# Patient Record
Sex: Male | Born: 1950 | Race: White | Hispanic: No | Smoking: Former smoker
Health system: Southern US, Community
[De-identification: ages and names within clinical notes are randomized; demographics above are authoritative.]

## PROBLEM LIST (undated history)

## (undated) DIAGNOSIS — R0602 Shortness of breath: Secondary | ICD-10-CM

## (undated) DIAGNOSIS — I4819 Other persistent atrial fibrillation: Secondary | ICD-10-CM

## (undated) DIAGNOSIS — I10 Essential (primary) hypertension: Secondary | ICD-10-CM

## (undated) DIAGNOSIS — E669 Obesity, unspecified: Secondary | ICD-10-CM

## (undated) DIAGNOSIS — E785 Hyperlipidemia, unspecified: Secondary | ICD-10-CM

## (undated) DIAGNOSIS — E119 Type 2 diabetes mellitus without complications: Secondary | ICD-10-CM

## (undated) DIAGNOSIS — Z7901 Long term (current) use of anticoagulants: Secondary | ICD-10-CM

## (undated) DIAGNOSIS — I2510 Atherosclerotic heart disease of native coronary artery without angina pectoris: Secondary | ICD-10-CM

## (undated) DIAGNOSIS — I4892 Unspecified atrial flutter: Secondary | ICD-10-CM

## (undated) DIAGNOSIS — K219 Gastro-esophageal reflux disease without esophagitis: Secondary | ICD-10-CM

## (undated) DIAGNOSIS — M1990 Unspecified osteoarthritis, unspecified site: Secondary | ICD-10-CM

## (undated) DIAGNOSIS — J449 Chronic obstructive pulmonary disease, unspecified: Secondary | ICD-10-CM

## (undated) DIAGNOSIS — G4730 Sleep apnea, unspecified: Secondary | ICD-10-CM

## (undated) DIAGNOSIS — Z720 Tobacco use: Secondary | ICD-10-CM

## (undated) HISTORY — DX: Tobacco use: Z72.0

## (undated) HISTORY — DX: Unspecified atrial flutter: I48.92

## (undated) HISTORY — PX: TIBIAL TUBERCLERPLASTY: SHX5953

## (undated) HISTORY — DX: Type 2 diabetes mellitus without complications: E11.9

## (undated) HISTORY — DX: Chronic obstructive pulmonary disease, unspecified: J44.9

## (undated) HISTORY — DX: Essential (primary) hypertension: I10

## (undated) HISTORY — DX: Hyperlipidemia, unspecified: E78.5

## (undated) HISTORY — DX: Obesity, unspecified: E66.9

## (undated) HISTORY — DX: Other persistent atrial fibrillation: I48.19

## (undated) HISTORY — DX: Long term (current) use of anticoagulants: Z79.01

---

## 1978-12-17 HISTORY — PX: CARPAL TUNNEL RELEASE: SHX101

## 1999-12-01 ENCOUNTER — Inpatient Hospital Stay (HOSPITAL_COMMUNITY): Admission: EM | Admit: 1999-12-01 | Discharge: 1999-12-02 | Payer: Self-pay | Admitting: *Deleted

## 1999-12-01 ENCOUNTER — Encounter: Payer: Self-pay | Admitting: Emergency Medicine

## 2000-01-08 ENCOUNTER — Inpatient Hospital Stay (HOSPITAL_COMMUNITY): Admission: EM | Admit: 2000-01-08 | Discharge: 2000-01-13 | Payer: Self-pay | Admitting: Emergency Medicine

## 2000-01-08 ENCOUNTER — Encounter: Payer: Self-pay | Admitting: Emergency Medicine

## 2000-02-09 ENCOUNTER — Inpatient Hospital Stay (HOSPITAL_COMMUNITY): Admission: EM | Admit: 2000-02-09 | Discharge: 2000-02-14 | Payer: Self-pay | Admitting: Emergency Medicine

## 2000-02-09 ENCOUNTER — Encounter: Payer: Self-pay | Admitting: Cardiology

## 2000-02-13 ENCOUNTER — Encounter: Payer: Self-pay | Admitting: General Surgery

## 2000-04-17 HISTORY — PX: ATRIAL FLUTTER ABLATION: SHX5733

## 2000-04-17 HISTORY — PX: CARDIAC CATHETERIZATION: SHX172

## 2000-04-17 HISTORY — PX: LOOP RECORDER IMPLANT: SHX5954

## 2000-12-25 ENCOUNTER — Encounter: Payer: Self-pay | Admitting: *Deleted

## 2000-12-25 ENCOUNTER — Ambulatory Visit (HOSPITAL_COMMUNITY): Admission: RE | Admit: 2000-12-25 | Discharge: 2000-12-25 | Payer: Self-pay | Admitting: *Deleted

## 2002-02-07 ENCOUNTER — Ambulatory Visit (HOSPITAL_COMMUNITY): Admission: RE | Admit: 2002-02-07 | Discharge: 2002-02-07 | Payer: Self-pay | Admitting: *Deleted

## 2005-08-27 ENCOUNTER — Emergency Department (HOSPITAL_COMMUNITY): Admission: EM | Admit: 2005-08-27 | Discharge: 2005-08-27 | Payer: Self-pay | Admitting: Emergency Medicine

## 2007-05-23 ENCOUNTER — Ambulatory Visit: Payer: Self-pay | Admitting: Family Medicine

## 2007-06-07 ENCOUNTER — Inpatient Hospital Stay: Payer: Self-pay | Admitting: Internal Medicine

## 2007-06-08 ENCOUNTER — Other Ambulatory Visit: Payer: Self-pay

## 2007-06-10 ENCOUNTER — Ambulatory Visit: Payer: Self-pay | Admitting: Internal Medicine

## 2007-08-25 ENCOUNTER — Observation Stay: Payer: Self-pay | Admitting: Internal Medicine

## 2007-08-25 ENCOUNTER — Other Ambulatory Visit: Payer: Self-pay

## 2007-12-04 ENCOUNTER — Other Ambulatory Visit: Payer: Self-pay

## 2007-12-04 ENCOUNTER — Emergency Department: Payer: Self-pay | Admitting: Emergency Medicine

## 2007-12-06 ENCOUNTER — Ambulatory Visit (HOSPITAL_COMMUNITY): Admission: RE | Admit: 2007-12-06 | Discharge: 2007-12-06 | Payer: Self-pay | Admitting: Internal Medicine

## 2008-05-05 LAB — CONVERTED CEMR LAB
Cholesterol: 220 mg/dL
HDL: 43 mg/dL
LDL (calc): 119 mg/dL
Triglyceride fasting, serum: 289 mg/dL

## 2008-06-22 ENCOUNTER — Ambulatory Visit: Payer: Self-pay | Admitting: Cardiology

## 2008-08-11 ENCOUNTER — Emergency Department (HOSPITAL_COMMUNITY): Admission: EM | Admit: 2008-08-11 | Discharge: 2008-08-11 | Payer: Self-pay | Admitting: Emergency Medicine

## 2009-12-27 ENCOUNTER — Ambulatory Visit (HOSPITAL_COMMUNITY): Admission: RE | Admit: 2009-12-27 | Discharge: 2009-12-27 | Payer: Self-pay | Admitting: Gastroenterology

## 2010-01-07 ENCOUNTER — Encounter: Payer: Self-pay | Admitting: Gastroenterology

## 2010-01-20 ENCOUNTER — Telehealth (INDEPENDENT_AMBULATORY_CARE_PROVIDER_SITE_OTHER): Payer: Self-pay

## 2010-02-08 ENCOUNTER — Telehealth (INDEPENDENT_AMBULATORY_CARE_PROVIDER_SITE_OTHER): Payer: Self-pay

## 2010-05-09 ENCOUNTER — Inpatient Hospital Stay (HOSPITAL_COMMUNITY): Admission: EM | Admit: 2010-05-09 | Discharge: 2010-05-10 | Payer: Self-pay | Source: Home / Self Care

## 2010-05-10 ENCOUNTER — Encounter (INDEPENDENT_AMBULATORY_CARE_PROVIDER_SITE_OTHER): Payer: Self-pay | Admitting: *Deleted

## 2010-05-10 LAB — CONVERTED CEMR LAB
ALT: 72 units/L
AST: 47 units/L
Albumin: 3.5 g/dL
Alkaline Phosphatase: 63 units/L
BUN: 11 mg/dL
Basophils Absolute: 0.1 10*3/uL
Basophils Relative: 1 %
CO2: 24 meq/L
Calcium: 39.4 mg/dL
Chloride: 109 meq/L
Creatinine, Ser: 0.78 mg/dL
Eosinophils Absolute: 0.3 10*3/uL
Eosinophils Relative: 5 %
GFR calc non Af Amer: >60 mL/min
Glomerular Filtration Rate, Af Am: >60 mL/min/{1.73_m2}
Glucose, Bld: 122 mg/dL
HCT: 38.2 %
Hemoglobin: 13.7 g/dL
Lymphocytes Relative: 53 %
Lymphs Abs: 3.6 10*3/uL
MCHC: 35.9 g/dL
MCV: 88.2 fL
Magnesium: 2 mg/dL
Monocytes Absolute: 0.6 10*3/uL
Monocytes Relative: 8 %
Neutro Abs: 2.3 10*3/uL
Platelets: 152 10*3/uL
Potassium: 3.7 meq/L
RBC: 4.33 M/uL
RDW: 12.9 %
Sodium: 140 meq/L
Total Protein: 5.8 g/dL
WBC: 6.8 10*3/uL

## 2010-05-10 LAB — COMPREHENSIVE METABOLIC PANEL
ALT: 81 U/L — ABNORMAL HIGH (ref 0–53)
AST: 56 U/L — ABNORMAL HIGH (ref 0–37)
Albumin: 3.9 g/dL (ref 3.5–5.2)
Alkaline Phosphatase: 83 U/L (ref 39–117)
BUN: 12 mg/dL (ref 6–23)
CO2: 24 mEq/L (ref 19–32)
Calcium: 9.7 mg/dL (ref 8.4–10.5)
Chloride: 105 mEq/L (ref 96–112)
Creatinine, Ser: 0.88 mg/dL (ref 0.4–1.5)
GFR calc Af Amer: >60 mL/min (ref >60)
GFR calc non Af Amer: >60 mL/min (ref >60)
Glucose, Bld: 266 mg/dL — ABNORMAL HIGH (ref 70–99)
Potassium: 3.8 mEq/L (ref 3.5–5.1)
Sodium: 138 mEq/L (ref 135–145)
Total Bilirubin: 0.3 mg/dL (ref 0.3–1.2)
Total Protein: 6.1 g/dL (ref 6.0–8.3)

## 2010-05-10 LAB — POCT CARDIAC MARKERS
CKMB, poc: 1.5 ng/mL (ref 1.0–8.0)
Myoglobin, poc: 62 ng/mL (ref 12–200)
Troponin i, poc: <0.05 ng/mL (ref 0.00–0.09)

## 2010-05-10 LAB — DIFFERENTIAL
Basophils Absolute: 0 10*3/uL (ref 0.0–0.1)
Basophils Relative: 1 % (ref 0–1)
Eosinophils Absolute: 0.2 10*3/uL (ref 0.0–0.7)
Eosinophils Relative: 4 % (ref 0–5)
Lymphocytes Relative: 48 % — ABNORMAL HIGH (ref 12–46)
Lymphs Abs: 2.7 10*3/uL (ref 0.7–4.0)
Monocytes Absolute: 0.3 10*3/uL (ref 0.1–1.0)
Monocytes Relative: 6 % (ref 3–12)
Neutro Abs: 2.4 10*3/uL (ref 1.7–7.7)
Neutrophils Relative %: 42 % — ABNORMAL LOW (ref 43–77)

## 2010-05-10 LAB — CBC
HCT: 40.1 % (ref 39.0–52.0)
Hemoglobin: 14.6 g/dL (ref 13.0–17.0)
MCH: 32.3 pg (ref 26.0–34.0)
MCHC: 36.4 g/dL — ABNORMAL HIGH (ref 30.0–36.0)
MCV: 88.7 fL (ref 78.0–100.0)
Platelets: 69 10*3/uL — ABNORMAL LOW (ref 150–400)
RBC: 4.52 MIL/uL (ref 4.22–5.81)
RDW: 12.9 % (ref 11.5–15.5)
WBC: 5.7 10*3/uL (ref 4.0–10.5)

## 2010-05-10 LAB — CK TOTAL AND CKMB (NOT AT ARMC)
CK, MB: 1.5 ng/mL (ref 0.3–4.0)
Relative Index: INVALID (ref 0.0–2.5)
Total CK: 74 U/L (ref 7–232)

## 2010-05-10 LAB — MAGNESIUM: Magnesium: 1.9 mg/dL (ref 1.5–2.5)

## 2010-05-10 LAB — TROPONIN I: Troponin I: 0.01 ng/mL (ref 0.00–0.06)

## 2010-05-11 LAB — DIFFERENTIAL
Basophils Absolute: 0.1 10*3/uL (ref 0.0–0.1)
Basophils Relative: 1 % (ref 0–1)
Eosinophils Absolute: 0.3 10*3/uL (ref 0.0–0.7)
Eosinophils Relative: 5 % (ref 0–5)
Lymphocytes Relative: 53 % — ABNORMAL HIGH (ref 12–46)
Lymphs Abs: 3.6 10*3/uL (ref 0.7–4.0)
Monocytes Absolute: 0.6 10*3/uL (ref 0.1–1.0)
Monocytes Relative: 8 % (ref 3–12)
Neutro Abs: 2.3 10*3/uL (ref 1.7–7.7)
Neutrophils Relative %: 33 % — ABNORMAL LOW (ref 43–77)

## 2010-05-11 LAB — CBC
HCT: 38.2 % — ABNORMAL LOW (ref 39.0–52.0)
Hemoglobin: 13.7 g/dL (ref 13.0–17.0)
MCH: 31.6 pg (ref 26.0–34.0)
MCHC: 35.9 g/dL (ref 30.0–36.0)
MCV: 88.2 fL (ref 78.0–100.0)
Platelets: 152 10*3/uL (ref 150–400)
RBC: 4.33 MIL/uL (ref 4.22–5.81)
RDW: 12.9 % (ref 11.5–15.5)
WBC: 6.8 10*3/uL (ref 4.0–10.5)

## 2010-05-11 LAB — COMPREHENSIVE METABOLIC PANEL
ALT: 72 U/L — ABNORMAL HIGH (ref 0–53)
AST: 47 U/L — ABNORMAL HIGH (ref 0–37)
Albumin: 3.5 g/dL (ref 3.5–5.2)
Alkaline Phosphatase: 63 U/L (ref 39–117)
BUN: 11 mg/dL (ref 6–23)
CO2: 24 mEq/L (ref 19–32)
Calcium: 9.4 mg/dL (ref 8.4–10.5)
Chloride: 109 mEq/L (ref 96–112)
Creatinine, Ser: 0.78 mg/dL (ref 0.4–1.5)
GFR calc Af Amer: >60 mL/min (ref >60)
GFR calc non Af Amer: >60 mL/min (ref >60)
Glucose, Bld: 122 mg/dL — ABNORMAL HIGH (ref 70–99)
Potassium: 3.7 mEq/L (ref 3.5–5.1)
Sodium: 140 mEq/L (ref 135–145)
Total Bilirubin: 0.3 mg/dL (ref 0.3–1.2)
Total Protein: 5.8 g/dL — ABNORMAL LOW (ref 6.0–8.3)

## 2010-05-11 LAB — HEPATITIS PANEL, ACUTE
HCV Ab: NEGATIVE
Hep A IgM: NEGATIVE
Hep B C IgM: NEGATIVE
Hepatitis B Surface Ag: NEGATIVE

## 2010-05-11 LAB — CARDIAC PANEL(CRET KIN+CKTOT+MB+TROPI)
CK, MB: 1.3 ng/mL (ref 0.3–4.0)
CK, MB: 1.3 ng/mL (ref 0.3–4.0)
Relative Index: INVALID (ref 0.0–2.5)
Relative Index: INVALID (ref 0.0–2.5)
Total CK: 67 U/L (ref 7–232)
Total CK: 66 U/L (ref 7–232)
Troponin I: 0.01 ng/mL (ref 0.00–0.06)
Troponin I: 0.01 ng/mL (ref 0.00–0.06)

## 2010-05-11 LAB — TSH: TSH: 2.992 u[IU]/mL (ref 0.350–4.500)

## 2010-05-11 LAB — GLUCOSE, CAPILLARY
Glucose-Capillary: 207 mg/dL — ABNORMAL HIGH (ref 70–99)
Glucose-Capillary: 146 mg/dL — ABNORMAL HIGH (ref 70–99)
Glucose-Capillary: 172 mg/dL — ABNORMAL HIGH (ref 70–99)

## 2010-05-11 LAB — MRSA PCR SCREENING: MRSA by PCR: NEGATIVE

## 2010-05-11 LAB — HEMOGLOBIN A1C
Hgb A1c MFr Bld: 7.9 % — ABNORMAL HIGH (ref <5.7)
Mean Plasma Glucose: 180 mg/dL — ABNORMAL HIGH (ref <117)

## 2010-05-11 LAB — T4, FREE: Free T4: 1.11 ng/dL (ref 0.80–1.80)

## 2010-05-11 LAB — MAGNESIUM: Magnesium: 2 mg/dL (ref 1.5–2.5)

## 2010-05-12 NOTE — Discharge Summary (Addendum)
[name] Cook, SCHROLL NO.:  [id number]  MEDICAL RECORD NO.:  [id number]          PATIENT TYPE:  INP  LOCATION:  ZO10                          FACILITY:  [facility]  PHYSICIAN:  Elliot Cousin, M.D.    DATE OF BIRTH:  Sep 12, 1950  DATE OF ADMISSION:  05/09/2010 DATE OF DISCHARGE:  01/24/2012LH                              DISCHARGE SUMMARY   DISCHARGE DIAGNOSES: 1. Chronic paroxysmal atrial fibrillation with rapid ventricular     response due to noncompliance. 2. Noncompliance secondary to financial constraints. 3. Type 2 diabetes mellitus. 4. Hypertension. 5. Transient thrombocytopenia, etiology unknown. 6. Hyperlipidemia. 7. Mild hepatic transaminitis.  DISCHARGE MEDICATIONS: 1. Aspirin 81 mg daily. 2. Metoprolol tartrate 50 mg b.i.d. 3. Pravastatin 40 mg daily. 4. Cardizem CD 180 mg daily. 5. Fish oil 2 capsules daily. 6. Tambocor 100 mg 1/2 a tablet twice daily. 7. Citalopram 40 mg daily. 8. Lantus insulin 10 units subcu at bedtime. 9. Metformin 1000 mg b.i.d. 10.Ranitidine 150 mg daily. 11.Testosterone 1 application daily. 12.Stop hydrochlorothiazide, simvastatin, Toprol-XL, Celebrex, and     niacin.  DISCHARGE DISPOSITION:  The patient was discharged home in improved and stable condition on May 10, 2010.  He has been scheduled to follow up with Dr. [name] on May 13, 2010, at 11 o'clock a.m. for a stress test.  He was advised to follow up with the health care provider at the [facility] Department at their next available appointment.  CONSULTATIONS:  [name] Cook. [name], MD, [facility]  PROCEDURES PERFORMED: 1. A 2D echocardiogram.  The results are pending at the time of     hospital discharge. 2. Chest x-ray on May 09, 2010.  The results revealed     cardiomegaly, suspect lingular scarring, no acute findings.  HISTORY OF PRESENTING ILLNESS:  The patient is a 60 year old man with a past medical history significant for  paroxysmal atrial fibrillation, status post ablation in 2002 for atrial flutter, type 2 diabetes mellitus, and hypertension.  He presented to the emergency department on May 09, 2010, with a chief complaint of chest pain and palpitations. When he was initially evaluated, he was noted to be in atrial fibrillation with a heart rate of 164 beats per minute.  His initial cardiac markers were within normal limits.  His serum potassium was within normal limits at 3.8.  His magnesium level was within normal limits at 1.9.  His chest x-ray revealed no acute findings.  He was admitted for further evaluation and management.  HOSPITAL COURSE: 1. PAROXYSMAL ATRIAL FIBRILLATION WITH RAPID VENTRICULAR RESPONSE.     The patient was bolused with IV Cardizem in the emergency     department.  He was subsequently started on a Cardizem drip.  IV     metoprolol had to be given as well to improve or decrease his heart     rate.  Flecainide was initially withheld because of the patient's     thrombocytopenia.  His platelet count was found to be 69 on     admission.  However, the next morning, it improved to 152.  It was  unclear whether or not the flecainide actually caused the     thrombocytopenia.  He had been taking flecainide daily up     until 2 days ago.  He did admit running out of metoprolol and     diltiazem due to a lack of money.  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  His rhythm eventually     converted to normal sinus rhythm the next morning.  His followup     EKG revealed sinus bradycardia with a heart rate of 58 beats per     minute.  For further evaluation, a number of studies were ordered     including a 2D echocardiogram, cardiac enzymes, TSH, free T4, and a     followup magnesium level.  All of his cardiac enzymes were within     normal limits.  His followup blood magnesium level was within     normal limits at 2.0.  The results of his TSH and free  T4 were     pending at the time of discharge.  The results of the 2D     echocardiogram were pending at the time of hospital discharge.  A     fasting lipid panel was ordered for tomorrow morning, however, the     patient is stable enough to be discharged home today.     Cardiologist, Dr. [name], was consulted.  He agreed with medical     management and believed that the flecainide could be restarted.     The patient's platelet count will need to be followed  on the     flecainide.  Dr. [name] arranged for the patient to undergo a     stress test on May 13, 2009.  The patient remained     hemodynamically stable throughout the hospitalization.  Because of     financial constraints, some of his medications were changed, so     that he could get most of his medications at [facility] for     4 dollars.  He does have some of his chronic     medications which he  received from the free clinic before     he was discharged from their practice due to unemployment.  The     patient remained in normal sinus rhythm. 2. TYPE 2 DIABETES MELLITUS.  The patient was restarted on Lantus.     Sliding scale NovoLog was added empirically.  Finally, metformin     and glipizide were restarted.  The patient informed the dictating     physician that glipizide had been discontinued previously.     Therefore, he was discharged home on Lantus and metformin.     Hemoglobin A1c was ordered but the result was pending at the time     of hospital discharge. 3. HYPERTENSION.  The patient's blood pressure remained well     controlled on diltiazem and metoprolol tartrate. 4. HYPERLIPIDEMIA.  The fasting lipid panel was to be ordered,     however, the patient is being discharged today.  He was maintained     on statin therapy with simvastatin during hospital course.     However, because he can obtain pravastatin at [facility] for 4     dollars , he was discharged on it. 5. THROMBOCYTOPENIA.  The patient's platelet  count was 69 on     admission.  The next day, it increased to 152.  The  etiology of     this transit thrombocytopenia was unknown.  His platelet count will     need to be monitored in the outpatient setting back on flecainide     which can cause thrombocytopenia. 6. MILD HEPATIC TRANSAMINITIS.  The patient's SGOT was 47 and his SGPT     was 72.     An acute viral hepatitis panel was ordered but the results were     pending at the time of hospital discharge.  The patient may have     underlying fatty liver.  The transaminitis may also be secondary to     statin therapy.  His liver transaminases will need to be monitored     in the outpatient setting.     Elliot Cousin, M.D.     DF/MEDQ  D:  05/10/2010  T:  05/11/2010  Job:  [id number]  cc:   [facility]  [name] Cook. [name], MD, [facility] [address] [place], [place] [address]  Electronically Signed by Elliot Cousin M.D. on 05/12/2010 03:31:22 PM

## 2010-05-13 ENCOUNTER — Encounter (INDEPENDENT_AMBULATORY_CARE_PROVIDER_SITE_OTHER): Payer: Self-pay | Admitting: *Deleted

## 2010-05-13 ENCOUNTER — Ambulatory Visit
Admission: RE | Admit: 2010-05-13 | Discharge: 2010-05-13 | Payer: Self-pay | Source: Home / Self Care | Attending: Cardiology | Admitting: Cardiology

## 2010-05-13 ENCOUNTER — Ambulatory Visit (HOSPITAL_COMMUNITY): Admission: RE | Admit: 2010-05-13 | Payer: Self-pay | Source: Home / Self Care | Admitting: Cardiology

## 2010-05-16 ENCOUNTER — Ambulatory Visit
Admission: RE | Admit: 2010-05-16 | Discharge: 2010-05-16 | Payer: Self-pay | Source: Home / Self Care | Attending: Cardiology | Admitting: Cardiology

## 2010-05-16 ENCOUNTER — Encounter (INDEPENDENT_AMBULATORY_CARE_PROVIDER_SITE_OTHER): Payer: Self-pay | Admitting: *Deleted

## 2010-05-16 DIAGNOSIS — K219 Gastro-esophageal reflux disease without esophagitis: Secondary | ICD-10-CM | POA: Insufficient documentation

## 2010-05-17 ENCOUNTER — Encounter: Payer: Self-pay | Admitting: Cardiology

## 2010-05-17 NOTE — Progress Notes (Signed)
Summary: phone note/ pt rescheduled TCS  Phone Note Call from Patient   Caller: Patient Summary of Call: Pt called and rescheduled his colonoscopy from 01/25/2010 til 02/09/2010 @ 9:15. (Couldn't afford prep yet ) LMOM for Kim that pt has rescheduled. Initial call taken by: Cloria Spring LPN,  January 20, 2010 8:58 AM     Appended Document: phone note/ pt rescheduled TCS Please ask pt to stop by for Suprep sample. Modify instruction for his 0915 time slot.  Appended Document: phone note/ pt rescheduled TCS I faxed a request to the drug company for more samples.Marland Kitchen  Appended Document: phone note/ pt rescheduled TCS I called [name] Cook and spoke with his wife and informed her that we had a sample bowel prep kit..She said they would pick it up on Monday.

## 2010-05-17 NOTE — Letter (Signed)
Summary: internal other /triage  internal other /triage   Imported By: Cloria Spring LPN 47/82/9562 13:08:65  _____________________________________________________________________  External Attachment:    Type:   Image     Comment:   External Document

## 2010-05-17 NOTE — Progress Notes (Signed)
Summary: cancel procedure  Phone Note Call from Patient Call back at Home Phone [phone]   Caller: Patient Summary of Call: pt called- he stated his blood sugar levels have been dropping x 3 days. Last night it was 59. He spoke with his girlfriends mother- who is a Engineer, civil (consulting) and she told him he needed to rescedule because it was too dangerous for him. Pt wasnt interested in any recomendations and wants to cancel his procedure. He stated he would call back to reschedule when he was ready.  Initial call taken by: Hendricks Limes LPN,  February 08, 2010 8:44 AM     Appended Document: cancel procedure Procedure cx and Endo notified.

## 2010-05-19 NOTE — Miscellaneous (Signed)
Summary: labs 05/10/2010 hospital  Clinical Lists Changes  Observations: Added new observation of MAGNESIUM: 2.0 mg/dL (16/01/9603 54:09) Added new observation of CALCIUM: 39.4 mg/dL (81/19/1478 29:56) Added new observation of ALBUMIN: 3.5 g/dL (21/30/8657 84:69) Added new observation of PROTEIN, TOT: 5.8 g/dL (62/95/2841 32:44) Added new observation of SGPT (ALT): 72 units/L (05/10/2010 15:23) Added new observation of SGOT (AST): 47 units/L (05/10/2010 15:23) Added new observation of ALK PHOS: 63 units/L (05/10/2010 15:23) Added new observation of GFR AA: >60 mL/min/1.54m2 (05/10/2010 15:23) Added new observation of GFR: >60 mL/min (05/10/2010 15:23) Added new observation of CREATININE: 0.78 mg/dL (04/19/7251 66:44) Added new observation of BUN: 11 mg/dL (03/47/4259 56:38) Added new observation of BG RANDOM: 122 mg/dL (75/64/3329 51:88) Added new observation of CO2 PLSM/SER: 24 meq/L (05/10/2010 15:23) Added new observation of CL SERUM: 109 meq/L (05/10/2010 15:23) Added new observation of K SERUM: 3.7 meq/L (05/10/2010 15:23) Added new observation of NA: 140 meq/L (05/10/2010 15:23) Added new observation of ABSOLUTE BAS: 0.1 K/uL (05/10/2010 15:23) Added new observation of BASOPHIL %: 1 % (05/10/2010 15:23) Added new observation of EOS ABSLT: 0.3 K/uL (05/10/2010 15:23) Added new observation of % EOS AUTO: 5 % (05/10/2010 15:23) Added new observation of ABSOLUTE MON: 0.6 K/uL (05/10/2010 15:23) Added new observation of MONOCYTE %: 8 % (05/10/2010 15:23) Added new observation of ABS LYMPHOCY: 3.6 K/uL (05/10/2010 15:23) Added new observation of LYMPHS %: 53 % (05/10/2010 15:23) Added new observation of ABS NEUTROPH: 2.3 K/uL (05/10/2010 15:23) Added new observation of PLATELETK/UL: 152 K/uL (05/10/2010 15:23) Added new observation of RDW: 12.9 % (05/10/2010 15:23) Added new observation of MCHC RBC: 35.9 g/dL (41/66/0630 16:01) Added new observation of MCV: 88.2 fL (05/10/2010  15:23) Added new observation of HCT: 38.2 % (05/10/2010 15:23) Added new observation of HGB: 13.7 g/dL (09/32/3557 32:20) Added new observation of RBC M/UL: 4.33 M/uL (05/10/2010 15:23) Added new observation of WBC COUNT: 6.8 10*3/microliter (05/10/2010 15:23)

## 2010-05-20 ENCOUNTER — Telehealth (INDEPENDENT_AMBULATORY_CARE_PROVIDER_SITE_OTHER): Payer: Self-pay | Admitting: *Deleted

## 2010-05-20 ENCOUNTER — Encounter (INDEPENDENT_AMBULATORY_CARE_PROVIDER_SITE_OTHER): Payer: Self-pay | Admitting: *Deleted

## 2010-05-22 NOTE — H&P (Signed)
NAME:  [name] Cook, SCHULENBURG NO.:  [id number]  MEDICAL RECORD NO.:  [id number]          PATIENT TYPE:  EMS  LOCATION:  ED                            FACILITY:  [facility]  PHYSICIAN:  Osvaldo Shipper, MD     DATE OF BIRTH:  1951/04/04  DATE OF ADMISSION:  05/09/2010 DATE OF DISCHARGE:  LH                             HISTORY & PHYSICAL   PRIMARY CARE PHYSICIAN:  [facility] clinic.  However, the patient has not been there since July of 2011.  He has been seen by [facility] in the past.  However, has not seen them in many years.  ADMISSION DIAGNOSES: 1. Atrial fibrillation with rapid ventricular response due to     noncompliance. 2. Diabetes type 2. 3. History of hypertension. 4. Thrombocytopenia possibly from flecainide use. 5. History of hyperlipidemia.  CHIEF COMPLAINT:  Palpitations and chest pain since 2 days.  HISTORY OF PRESENT ILLNESS:  The patient is a 60 year old Caucasian male who has a past medical history of paroxysmal A fib.  He is status post ablation for A flutter and 2002.  Has history of diabetes, hypertension and hyperlipidemia who was in his usual state of health about 2 days ago when he started noticing that his heart was beating fast.  He got worse today.  He experienced some chest tightness and shortness of breath with these symptoms.  Denies any dizziness, lightheadedness.  No syncopal episodes.  No nausea or vomiting.  No fever or chills.  No cough. Denies any other illnesses recently.  He had one episode of diarrhea earlier today.  Since his symptoms were getting worse, he decided to come into the hospital.  He tells me that for the past 2 weeks he has not been able to afford his medications and so has been missing a lot of his doses.  Although he does have flecainide at home, he has not been taking it for reasons that are not entirely clear.  MEDICATIONS:  According to the medication list he has, he is on the following.  Please note there  are no doses mentioned on this list. 1. Cardizem. 2. Toprol XL. 3. Glipizide. 4. Metformin. 5. Fish oil. 6. Tambocor which is basically flecainide. 7. Hydrochlorothiazide. 8. Meloxicam. 9. Methocarbamol. 10.Simvastatin. 11.Ranitidine. 12.He is also on Lantus insulin 10 units subcu at bedtime. As I mentioned, he has been out of most of his medications for the last 2 weeks due to financial difficulties.  ALLERGIES:  NO KNOWN DRUG ALLERGIES.  PAST MEDICAL HISTORY:  Positive for A flutter, status post ablation in 2002.  He has mild CAD.  He had a 50% circumflex lesion which was not intervened upon.  He has a history of diabetes, hypertension, dyslipidemia.  He had a loop recorder implanted and then subsequently explanted.  He has history of diabetes.  No recent echocardiogram report is available.  SOCIAL HISTORY:  Lives in [place] with his girlfriend of 17 years. Is currently unemployed.  He is a Curator.  He quit smoking 1 week ago. Was smoking half pack of cigarettes on a daily basis.  Denies  any alcohol use.  No illicit drug use.  FAMILY HISTORY:  Positive for Alzheimer's disease and osteoporosis in his mother.  REVIEW OF SYSTEMS:  GENERAL:  Positive for weakness, malaise.  HEENT: Unremarkable.  CARDIOVASCULAR:  As in HPI.  RESPIRATORY: As in HPI.  GI: As in HPI.  GU: Unremarkable.  NEUROLOGIC: Unremarkable.  PSYCHIATRIC: Unremarkable.  DERMATOLOGIC:  Unremarkable.  Other systems reviewed and found to be negative.  PHYSICAL EXAMINATION:  VITAL SIGNS: Temperature is 97.7; blood pressure when he came in was 166/95, subsequently 136/88; heart rate 157; respiratory rate 18; saturation 99% on 2 liters.  His heart rate is irregularly irregular. GENERAL:  This is an obese white male in no distress, slightly anxious. HEENT: Head is normocephalic, atraumatic.  Pupils are equal reacting. No pallor, no icterus.  Oral mucous membranes moist.  No oral lesions noted. NECK: Soft  and supple.  No thyromegaly appreciated.  No cervical, supraclavicular or inguinal lymphadenopathy is present. LUNGS: Clear to auscultation bilaterally with no wheezing, rales or rhonchi. CARDIOVASCULAR:  S1, S2 is irregularly irregular, tachycardic.  No S3, S4, rubs, murmurs or bruits.  No pedal edema. ABDOMEN:  Soft.  Nondistended.  There was minimal tenderness at the left lower quadrant which was again not reproducible.  No rebound, rigidity or guarding.  No masses or organomegaly. GU: Deferred. MUSCULOSKELETAL:  Normal muscle mass and tone. NEUROLOGIC:  He is alert, oriented x3.  No focal neurological deficits are present. SKIN:  Does not reveal any rashes.  LABORATORY DATA:  His CBC shows a white count of 5.7, hemoglobin 14.6, platelet count 69 which is new.  His electrolytes are normal.  Magnesium level is pending.  Glucose 266.  Renal function is normal.  AST is 56, ALT is 81, calcium is 9.7.  Cardiac enzymes negative times one.  He had a chest x-ray which showed cardiomegaly with suspect lingula or scarring without any other acute findings.  He had an EKG which showed A fib at 164, irregular rhythm.  Normal axis.  Intervals appear to be in the normal range.  No Q-waves.  No concerning T-wave changes.  There is some rate-related ST depression that is noted.  ASSESSMENT:  This is a 60 year old Caucasian male who has a history of paroxysmal atrial fibrillation, diabetes and hypertension who has not been able to afford his medications in the last 2 weeks and presents with palpitations, chest tightness and shortness of breath and is found to have atrial fibrillation with rapid ventricular response which is accounting for all of his symptoms at this time.  PLAN: 1. Rapid atrial fibrillation.  He is on a Cardizem drip.  Will give     him beta blockers.  Will hold off on the flecainide because of his     thrombocytopenia.  A TSH level will be checked.  Echocardiogram     will be  checked.  Cardiology consultation will be ordered as well. 2. Thrombocytopenia possibly from flecainide use.  We will recheck     levels in the morning. 3. Diabetes.  Continue with Lantus.  Hold his oral hypoglycemic agents     for now.  Hb A1c will be checked. 4. History of hypertension.  Continue with beta blockers. 5. Coronary artery disease is stable.  Check cardiac enzymes. 6. Mild transaminitis.  We will recheck levels in the morning.  At     this time, I think we can continue with the simvastatin. 7. Full code. 8. DVT prophylaxis with SCDs.  Further  management decisions will depend on results of further testing and patient's response to treatment.  Osvaldo Shipper, MD     GK/MEDQ  D:  05/09/2010  T:  05/09/2010  Job:  [id number]  cc:   [name]. [name], MD, [facility] [address] [place], [place] [address]  Free Clinic  Electronically Signed by Osvaldo Shipper MD on 05/22/2010 05:02:08 PM

## 2010-05-25 NOTE — Letter (Signed)
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 [address], [place] [address]   Phone: [phone]  Fax: [phone]    05/20/2010  TO: Leodis Sias IT MAY CONCERN   RE: [name] Cook Ullman [address] [id number]   The above named individual is under my medical care and may return to work on:05/23/2010.  [name] does not have any cardiac issues that would require him to not be able to work.  If you have any further questions or need additional information, please call.     Sincerely,    Lorin Picket, NP

## 2010-05-25 NOTE — Assessment & Plan Note (Signed)
Summary: rov post stress test/tmj   Visit Type:  Follow-up Primary Provider:  [facility]   History of Present Illness: Mr. Newt Levingston returns to the office for continuing assessment and treatment of hypertension, hypertensive heart disease and paroxysmal atrial arrhythmias.  He was recently hospitalized with AF and a rapid ventricular response after not taking his medicines as prescribed.  He is unemployed and unable to afford his medical regime even though most of the drugs are low priced.  He has class II dyspnea on exertion without chest discomfort, lightheadedness or syncope.  Current Medications (verified): 1)  Aspir-Low 81 Mg Tbec (Aspirin) .... Take 1 Tab Daily 2)  Metoprolol Tartrate 50 Mg Tabs (Metoprolol Tartrate) .... Take 1 Tab Two Times A Day 3)  Pravastatin Sodium 40 Mg Tabs (Pravastatin Sodium) .... Take 1 Tab Daily 4)  Cardizem Cd 180 Mg Xr24h-Cap (Diltiazem Hcl Coated Beads) .... Take 1 Tab Daily 5)  Fish Oil 1000 Mg Caps (Omega-3 Fatty Acids) .... Take 2 Caps Daily 6)  Tambocor 100 Mg Tabs (Flecainide Acetate) .... Take 1/2 Tab Two Times A Day 7)  Lantus 100 Unit/ml Soln (Insulin Glargine) .Marland Kitchen.. 1o Units At Bedtime 8)  Metformin Hcl 1000 Mg Tabs (Metformin Hcl) .... Take 1 Tab Two Times A Day 9)  Ranitidine Hcl 150 Mg Caps (Ranitidine Hcl) .... Take 1 Tab Daily 10)  First-Testosterone Mc 2 % Crea (Testosterone Propionate) .... Apply 1 Time Daily 11)  Lexapro 20 Mg Tabs (Escitalopram Oxalate) .... Take 1 Tab Daily 12)  Meloxicam 7.5 Mg Tabs (Meloxicam) .... Take As Needed 13)  Daily Multi  Tabs (Multiple Vitamins-Minerals) .... Take 1 Tab Daily 14)  Vitamin B-12 2500 Mcg Subl (Cyanocobalamin) .... Take 1 Tab Daily  Allergies (verified): No Known Drug Allergies  Comments:  Nurse/Medical Assistant: patient brought meds he is out of celexa 40 mg at this moment i told patient i would send his meds into [facility] to try to get him some help with meds .  Robbie Lis [facility]oth  is patients pharmacy.  Past History:  PMH, FH, and Social History reviewed and updated.  Past Medical History: Paroxysmal atrial fibrillation-onset in 2009; cardiac cath in 2001-50% circumflex; recurred 04/2010 Atrial flutter-radiofrequency ablation in 2002 Hypertension Hyperlipidemia Tobacco abuse-30 pack years Noncompliance secondary to financial constraints Diabetes Gastroesophageal reflux disease  EKG  Procedure date:  06/22/2008  Findings:      Normal sinus rhythm Within normal limits No change when compared to a previous tracing of 12/06/07  EKG  Procedure date:  05/16/2010  Findings:      Sinus bradycardia at a rate of 54 bpm J-Point elevation with minimal inferior Q waves Otherwise within normal limits No change compared to with a prior ECG of 06/22/08  -  Date:  05/05/2008    Cholesterol: 220    LDL-calculated: 119    HDL: 43    Triglycerides: 161   Family History: Mother: Alzheimer's and osteoporosis; alive Siblings: Alive and well  Social History: Employment-unemployed Tobacco Use - 30 pack years; discontinued in 2012  Alcohol Use - no Regular Exercise - no Drug Use - no Marital-single; resides with girlfriend; no children  Review of Systems       See history of present illness.  Vital Signs:  Patient profile:   60 year old male Height:      73 inches Weight:      244 pounds BMI:     32.31 O2 Sat:  92 % on Room air Pulse rate:   58 / minute BP sitting:   117 / 72  (left arm)  Vitals Entered By: Dreama Saa, CNA (May 16, 2010 1:18 PM)  O2 Flow:  Room air  Physical Exam  General:  Proportionate weight and height, but with increased central adiposity; well developed; no acute distress:   Neck-No JVD; no carotid bruits: Lungs-No tachypnea, no rales; no rhonchi; no wheezes: Cardiovascular-normal PMI; normal S1 and S2: Abdomen-BS normal; soft and non-tender without masses or organomegaly:  Musculoskeletal-No  deformities, no cyanosis or clubbing: Neurologic-Normal cranial nerves; symmetric strength and tone:  Skin-Warm, no significant lesions: Extremities-Nl distal pulses; no edema:     Impression & Recommendations:  Problem # 1:  ATRIAL FIBRILLATION-PAROXYSMAL (ICD-427.31) No clinical recurrence in the week since hospital discharge.  Flecainide dosage is currently low, providing room for increased treatment should AF recurred.  In the absence of a sustained arrhythmia, he will not require full anticoagulation.  Problem # 2:  ATRIAL FLUTTER-S/P RFA (ICD-427.32) Patient has had no recurrence of atrial flutter since radiofrequency ablation a decade ago.  Problem # 3:  HYPERTENSION (ICD-401.1) Blood pressure control is excellent; current medications will be continued.  Problem # 4:  HYPERLIPIDEMIA (ICD-272.4) In the setting of known diabetes, better control of hyperlipidemia is warranted.  A lipid profile will be repeated.  If values are similar to those obtained 2 years ago, treatment with Pravastatin 40 mg q.d. will be initiated.  CHOL: 220 (05/05/2008)   LDL: 119 (05/05/2008)   HDL: 43 (05/05/2008)   TG: 289 (05/05/2008)  Patient's medications were reviewed.  Both Lexapro and Celexa are listed although it appears that he is only actually taking Lexapro.  Celexa is superfluous and will be discontinued.  Other Orders: Future Orders: T-Lipid Profile ([id number]) ... 06/15/2010 T-Comprehensive Metabolic Panel [phone]) ... 06/15/2010 T-Hgb A1C ([id number]) ... 06/15/2010  Patient Instructions: 1)  Your physician recommends that you schedule a follow-up appointment in: 3 months 2)  Your physician recommends that you return for lab work in: 1 months

## 2010-05-25 NOTE — Letter (Signed)
Summary: [place] [employer] Work Engineer, agricultural at [employer]  [address] [place] [address]   Phone: [phone]  Fax: [phone]     May 16, 2010 MRN: [id number]   [facility] [address] [place], [place]  [address]      YOUR LAB WORK IS DUE   June 15, 2010  Please go to [facility], located across the street from [facility] on the second floor.  Hours are Monday - Friday 7am until 7:30pm         Saturday 8am until 12noon    _X_  DO NOT EAT OR DRINK AFTER MIDNIGHT EVENING PRIOR TO LABWORK

## 2010-05-25 NOTE — Progress Notes (Signed)
Summary: needs letter for work  Phone Note Call from Patient Call back at [facility] Phone [phone] Call back at [phone]   Caller: pt Reason for Call: Talk to Nurse Summary of Call: pt needs a letter from doctor stating that he okay to work. Patient states that he started a new job yesterday and his HR went up and now they are concerned about keeping him on as a employee. Initial call taken by: Faythe Ghee,  May 20, 2010 3:17 PM  Follow-up for Phone Call        letter faxed  to [facility] job manager  Follow-up by: Teressa Lower RN,  May 20, 2010 4:27 PM

## 2010-05-31 ENCOUNTER — Encounter (INDEPENDENT_AMBULATORY_CARE_PROVIDER_SITE_OTHER): Payer: Self-pay | Admitting: *Deleted

## 2010-05-31 ENCOUNTER — Telehealth (INDEPENDENT_AMBULATORY_CARE_PROVIDER_SITE_OTHER): Payer: Self-pay | Admitting: *Deleted

## 2010-06-08 NOTE — Progress Notes (Signed)
Summary: Restrictions for work  Phone Note Call from Patient   Caller: Patient Reason for Call: Talk to Nurse Summary of Call: patient states he need letter faxed to Ronita Hipps, MS @ [facility] [employer] stating if he has any restrictions of working / pt has signed release and it is on file / tg  Furniture conservator/restorer for Ronita Hipps is phone # [phone] and fax # [phone] ** Initial call taken by: Raechel Ache [facility],  May 31, 2010 12:11 PM  Follow-up for Phone Call        letter faxed at 1600 05/31/2010 Follow-up by: Teressa Lower RN,  May 31, 2010 4:06 PM

## 2010-06-08 NOTE — Letter (Signed)
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 [address], [place] [address]   Phone: [phone]  Fax: [phone]    05/31/2010  TO: Leodis Sias IT MAY CONCERN   RE: [name] Cook [address] [id number]   The above named individual is under my medical care and may return to work on:05/23/2010.  He may return at full capacity duty no limitations.  If you have any further questions or need additional information, please call.     Sincerely,    Dr. Dolton Bing, MD, [facility]

## 2010-06-14 ENCOUNTER — Telehealth (INDEPENDENT_AMBULATORY_CARE_PROVIDER_SITE_OTHER): Payer: Self-pay | Admitting: *Deleted

## 2010-06-14 ENCOUNTER — Encounter (INDEPENDENT_AMBULATORY_CARE_PROVIDER_SITE_OTHER): Payer: Self-pay | Admitting: *Deleted

## 2010-06-23 NOTE — Progress Notes (Signed)
Summary: med changes in chart  Phone Note Other Incoming   Summary of Call: i sent meds into sonja gunn at the drug asssistant program some she are un able to get so i removed pravastatin 40 mg,ranitidine 150 mg metoprolol 50 mg two times a day ,metformin 1000 two times a day and meloxicam 7.5 mg daily and replaced them with crestor 20mg  ,nexium 40mg  toprol xl 100 mg daily,janumet 50/1000 two times a day and celebrex 200 mg daily.i will also talk with Dr.Rothbart to get his diltiazem replaced with another name brand drug.veralan 180 mg is to replace diltiazem 180 mg daily Initial call taken by: Dreama Saa, CNA,  June 14, 2010 3:51 PM

## 2010-06-23 NOTE — Miscellaneous (Signed)
Summary: veralan 180 mg replace diltiazem 180 mg daily  Clinical Lists Changes  Medications: Added new medication of VERELAN 180 MG XR24H-CAP (VERAPAMIL HCL) take 1 tab daily

## 2010-06-23 NOTE — Miscellaneous (Signed)
Summary: new rx added to list in place of meds drug assistance are unable  Clinical Lists Changes  Medications: Added new medication of CRESTOR 20 MG TABS (ROSUVASTATIN CALCIUM) take 1 tab daily Added new medication of NEXIUM 40 MG CPDR (ESOMEPRAZOLE MAGNESIUM) take 1 tab daily Added new medication of TOPROL XL 100 MG XR24H-TAB (METOPROLOL SUCCINATE) take 1 tab daily Added new medication of JANUMET 50-1000 MG TABS (SITAGLIPTIN-METFORMIN HCL) take 1 tab two times a day Added new medication of CELEBREX 200 MG CAPS (CELECOXIB) take 1 tab daily Removed medication of METOPROLOL TARTRATE 50 MG TABS (METOPROLOL TARTRATE) take 1 tab two times a day Removed medication of PRAVASTATIN SODIUM 40 MG TABS (PRAVASTATIN SODIUM) take 1 tab daily Removed medication of RANITIDINE HCL 150 MG CAPS (RANITIDINE HCL) take 1 tab daily Removed medication of METFORMIN HCL 1000 MG TABS (METFORMIN HCL) take 1 tab two times a day Removed medication of MELOXICAM 7.5 MG TABS (MELOXICAM) take as needed

## 2010-08-18 ENCOUNTER — Telehealth: Payer: Self-pay | Admitting: Cardiology

## 2010-08-18 DIAGNOSIS — E119 Type 2 diabetes mellitus without complications: Secondary | ICD-10-CM

## 2010-08-18 DIAGNOSIS — E785 Hyperlipidemia, unspecified: Secondary | ICD-10-CM

## 2010-08-18 NOTE — Telephone Encounter (Signed)
Patient states that he is on Tampicor and can not afford it / states that he was told by Blima Singer @ [facility] to call here and see if he could be switched to a medicine that they could help him with through patient assistance program at [facility] / tg

## 2010-08-19 NOTE — Telephone Encounter (Addendum)
S: unable to afford tambocor B: last office visit on 05/16/10, takes tambocor 100mg   1/2 tablet bid A: per Sonja Gunn-pt assist at [facility] pt needs to be placed on approved medicaiton on the list: Diltiazem, she also requests a rx for lexapro 20mg  daily R:routed to you since Samara Deist is out

## 2010-08-20 NOTE — Consult Note (Signed)
NAME:  [name] Cook, [name] Cook NO.:  [id number]  MEDICAL RECORD NO.:  [id number]          PATIENT TYPE:  INP  LOCATION:                                FACILITY:  [facility]  PHYSICIAN:  [name]. [name], MD, FACCDATE OF BIRTH:  March 26, 1951  DATE OF CONSULTATION:  05/10/2010 DATE OF DISCHARGE:                                CONSULTATION   Primary MD is the free clinic.  We are consulted by Dr. Elliot Cousin  CHIEF COMPLAINT:  Rapid heartbeat and chest pain.  HISTORY OF PRESENT ILLNESS:  This is a 60 year old white male patient with a history of paroxysmal atrial fibrillation who has not been seen by cardiology since March 2010.  He had radiofrequency ablation in 2001 for atrial flutter.  He was admitted with a 2-day history of rapid heart beat.  He had stopped his medications 2 weeks ago, because he could not afford them.  He was taking flecainide up until 2 days ago when he ran out of it.  The palpitations worsened yesterday and were associated with chest tightness into his left arm and shortness of breath.  This prompted him to come to the emergency room.  The patient's flecainide has been held since admission because his platelets were low at 69, and they were concerned about thrombocytopenia secondary to flecainide.  Repeat on his platelets are up to 152.  ALLERGIES:  No known drug allergies.  CURRENT MEDICATIONS:  Metoprolol is being switched from IV to 25 mg b.i.d., Cardizem CD 180 mg daily, glipizide 5 mg b.i.d., coated aspirin 325 mg daily, Zocor 40 mg daily, and he was on flecainide 50 mg b.i.d.  PAST MEDICAL HISTORY:  Significant for ablation for atrial flutter in 2001.  He also had a cardiac catheterization in 2001 by Dr. Vickii Penna which showed a 50% mid ramus, normal LAD and RCA and normal LV function.  He has a history of diabetes mellitus, hypertension, hyperlipidemia, gastroesophageal reflux disease, and he had a loop recorder inserted in 2002 and  removed.  SOCIAL HISTORY:  He is single, has no children, he smoked for over 30 pack years but quit 1 week ago that he has been out of work since 2009 and is applying for disability.  FAMILY HISTORY:  His mother has Alzheimer and osteoporosis.  Siblings are alive and well.  REVIEW OF SYSTEMS:  Significant for chest pain, shortness of breath, dyspnea on exertion, and occasional dizziness.  He has exertional chest pain that has not changed over the past 3 years.  Please see HPI for details  PHYSICAL EXAMINATION:  GENERAL:  This is a very pleasant 60 year old white male in no acute distress. VITAL SIGNS:  Blood pressure is 112/67, pulse 70, and in normal sinus rhythm.  Temperature 98, O2 sats 97. HEENT:  Head is normocephalic without any trauma.  Extra ocular eye movements are intact.  Pupils equal, round, reactive to light and accommodation.  Nasal mucosa is moist.  Throat is without erythema or exudate. LUNGS:  Decreased breath sounds but clear anterior, posterior, and lateral. HEART:  Regular rate and rhythm at 70  beats per minute.  Normal S1 and S2.  No murmur, rub, bruit, thrill, or heave noted. ABDOMEN:  Soft.  With no organomegaly, masses, lesions,  or abnormal tenderness. EXTREMITIES:  Without cyanosis, clubbing, or edema.  He has good distal pulses. NEUROLOGIC:  Alert and oriented x3.  CN II through XII grossly intact. MUSCULOSKELETAL:  No joint deformities or effusion.  LABORATORY DATA:  Chest x-ray cardiomegaly suspect linear scarring.  EKG on admission atrial fibrillation at a rate of 164 beats per minute, nonspecific ST-T wave changes.  EKG today sinus bradycardia, T-wave inversion in aVL, V1, and V2. Platelets were 69 on admission, now 152.  Sodium 140, potassium 3.9, chloride 109, CO2 24, BUN 11, creatinine 0.78 CKs, MBs negative x3 troponins negative times three, magnesium 2.90.  IMPRESSION: 1. Rapid atrial fibrillation converted to normal sinus rhythm on IV      Cardizem, Lopressor and atrial fibrillation  initiated by     noncompliance. 2. History of paroxysmal atrial fibrillation treated with flecainide. 3. Thrombocytopenia on admission, question secondary to flecainide,     platelets are back up to normal. 4. History of radiofrequency ablation for atrial flutter in 2001. 5. History of coronary artery disease with 50% ramus on cath in 2001     with ongoing exertional chest tightness. 6. History of normal LV function. 7. History of hypertension. 8. History of tobacco abuse 1 week ago. 9. Diabetes mellitus. 10.Hyperlipidemia and a number R.  PLAN:  The patient is maintaining normal sinus rhythm currently.  I will discuss with Dr. [name] restarting his flecainide , since his platelets are back up to normal and we can watch this closely. Therefore restarted the flecainide.  The patient had not been on Coumadin in the past because of his awareness of his atrial fibrillation and he does not go into it very often.  He does have risk factors including hypertension and diabetes.  The patient will also need a stress Myoview for his recurrent chest pain.  I will schedule that for tomorrow.     Jacolyn Reedy, PA-C   ______________________________ [name]. [name], MD, [facility]    ML/MEDQ  D:  05/10/2010  T:  05/11/2010  Job:  [id number]  Electronically Signed by Jacolyn Reedy PA-C on 07/13/2010 12:49:42 PM Electronically Signed by Talpa Bing MD [facility] on 08/20/2010 10:12:35 PM

## 2010-08-22 NOTE — Telephone Encounter (Signed)
There is no true substitute for Tambocor, certainly not diltiazem.   Rhythmol or Tikosyn could be considered, if those are available.  Otherwise, we will need to the indigent drug program for either Tambocor or Rythmol.

## 2010-08-25 NOTE — Telephone Encounter (Signed)
Rhythmol is available, I need the dosage and we are set to go.

## 2010-08-26 NOTE — Telephone Encounter (Signed)
Rythmol 150 mg t.i.d. EKG in one week CMet and magnesium level in one week.

## 2010-08-29 ENCOUNTER — Encounter: Payer: Self-pay | Admitting: *Deleted

## 2010-08-29 MED ORDER — ESCITALOPRAM OXALATE 20 MG PO TABS: 20.0000 mg | ORAL_TABLET | Freq: Every day | ORAL | Status: DC

## 2010-08-29 MED ORDER — PROPAFENONE HCL 150 MG PO TABS: 150.0000 mg | ORAL_TABLET | Freq: Three times a day (TID) | ORAL | Status: DC

## 2010-08-29 MED ORDER — PROPAFENONE HCL 150 MG PO TABS: 150.0000 mg | ORAL_TABLET | Freq: Two times a day (BID) | ORAL | Status: DC

## 2010-08-29 NOTE — Telephone Encounter (Signed)
Addended by: Teressa Lower on: 08/29/2010 10:01 AM   Modules accepted: Orders

## 2010-08-29 NOTE — Telephone Encounter (Signed)
Addended by: Teressa Lower on: 08/29/2010 09:44 AM   Modules accepted: Orders, Medications

## 2010-08-30 NOTE — Letter (Signed)
June 22, 2008    Dr. Katherene Ponto  [facility]  [address]  [place], [place] [address]   RE:  [name] Cook, [name] Cook  MRN:  [id number]  /  DOB:  March 30, 1951   Dear Dr. Katherene Ponto:   It was my pleasure evaluating [name] Cook in the office today in  consultation at your request for paroxysmal atrial fibrillation.  He has  a history dating back years, but has had no symptoms suggesting  recurrence since a hospital admission in mid 2009.  He underwent cardiac  catheterization approximately 7 years ago revealing a 50% circumflex  lesion that was not thought to be causing ischemia.  He had multiple  hospital presentations in 2001 and 2002 with recurrent atrial  arrhythmias eventually prompting ablation of atrial flutter.   Cardiovascular risk factors include diabetes, hypertension, and 30 pack-  year history of cigarette smoking that continues.  He also has  hyperlipidemia.   PAST MEDICAL HISTORY:  Otherwise benign.  He has had no surgeries other  than implantation of an event recorder.  He has only been hospitalized  for his atrial arrhythmias.   He reports no drug allergies.   CURRENT MEDICATIONS:  1. Metformin 1000 mg b.i.d.  2. Diltiazem 240 mg daily.  3. Flecainide 100 mg b.i.d.  4. Meloxicam 7.5 mg daily.  5. Hydrochlorothiazide 25 mg daily.  6. Toprol 100 mg daily.  7. Simvastatin 40 mg daily.  8. Fish oil q.i.d.   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  Single; he has never been married nor had  children.  Walks for exercise.   FAMILY HISTORY:  Mother and father and 2 siblings are alive and well; no  notable diseases run in his family.   REVIEW OF SYSTEMS:  Positive for the need for corrective lenses, upper  and lower dentures, GERD symptoms, and arthritic discomfort in the back  and knees.  All other systems reviewed and are negative.   PHYSICAL EXAMINATION:  GENERAL:  Pleasant gentleman with flat  affect, in  no acute distress.  The weight is 215.  VITAL SIGNS:  Blood pressure 110/80, heart rate 70 and regular,  respirations 14 and unlabored.  HEENT:  Anicteric sclerae; normal oral mucosa.  NECK:  No jugular venous distention; normal carotid upstrokes without  bruits.  ENDOCRINE:  No thyromegaly.  HEMATOPOIETIC:  No adenopathy.  SKIN:  No significant lesions.  LUNGS:  Clear.  CARDIAC:  Normal first and second heart sounds.  ABDOMEN:  Soft and nontender; no organomegaly.  EXTREMITIES:  No edema; normal distal pulses.  NEUROLOGIC:  Symmetric strength and tone; normal cranial nerves.   EKG:  Normal sinus rhythm; within normal limits.  No change compared  with the prior tracing of December 06, 2007.   IMPRESSION:  [name] Cook has long history of paroxysmal atrial  arrhythmias.  He does have significant risk factors for thromboembolism,  most notably diabetes and hypertension.  Fortunately, he appears to  sense whenever his arrhythmia occurs.  Since he has not had any known  sustained episodes and no apparent episodes for months, I do not believe  that  anticoagulation is warranted.  We will check a flecainide level to  determine if a higher dose can be utilized should atrial fibrillation  recur.  His last lipid profile was suboptimal.  His dose of simvastatin  will be increased to 80 mg daily and a followup lipid profile obtained.  I will plan to reassess this nice gentleman in 8 months.    Sincerely,      [name] Friends. [name], MD, [facility]  Electronically Signed    RMR/MedQ  DD: 06/22/2008  DT: 06/23/2008  Job #: [id number]

## 2010-09-02 NOTE — Discharge Summary (Signed)
[name]. [name]  Patient:    [name] Cook, [name] Cook                          MRN: [id number] Adm. Date:  [id number] Disc. Date: [id number] Attending:  Ophelia Shoulder Dictator:   Mancel Bale, P.A. CC:         Ronnald Nian, M.D.  Doylene Canning. Ladona Ridgel, M.D. [facility]  Nathen May, M.D., [facility]   Discharge Summary  ADMISSION DIAGNOSES: 1. Paroxysmal atrial flutter. 2. Status post cardiac catheterization that revealed 50% mid intermedius ramus    stenosis with no other abnormality. 3. Status post echocardiogram that revealed ejection fraction 55-65%.  DISCHARGE DIAGNOSES: 1. Paroxysmal atrial flutter. 2. Status post cardiac catheterization that revealed 50% mid intermedius ramus    stenosis with no other abnormality. 3. Status post echocardiogram that revealed ejection fraction 55-65%. 4. Status post transesophageal echocardiogram on February 13, 2000. 5. Status post electrophysiology study on February 13, 2000, by Dr. [name] with atrial flutter ablation. 6. Chronic Coumadin therapy. 7. Dyslipidemia. 8. Hypertension.  HISTORY OF PRESENT ILLNESS:  [name] Cook is a 60 year old white single male with a history of paroxysmal atrial flutter with two admissions to [facility] December 01, 1999, and January 08, 2000, secondary to this atrial flutter. Dr. Graciela Husbands had been consulted, and recommendations were made.  The patient was to be seen at [facility] on December 3 by Dr. Monia Pouch for ablation.  However, he presented again on February 09, 2000, with complaints of weakness and shortness of breath.  He stated that on Saturday he felt bad with shortness of breath, and the symptoms improved until the day of admission.  At that time he had increased dizziness and shortness of breath and felt his heart rate increasing with activity.  He denied any chest pain.  When hospitalized recently he had difficulty converting to sinus rhythm. There were no changes  with Adenocard.  No change with IV Cardizem bolus. Cardizem had been started with another bolus of 30 mg.  Still no change with heart rate 160.  He then got an esmolol bolus and drip.  He was then DC cardioverted and went into sinus rhythm with 150 joules.  However, he then had recurrent atrial flutter.  IV amiodarone was begun, and he converted to sinus rhythm.  After discussion with [facility], amiodarone was discontinued, and the patient placed on Tambocor and Cardizem which were then subsequently discontinued.  At that time [name] Cook was planned for admission for atrial flutter with rapid ventricular response and shortness of breath.   On exam at that time his heart rate was 129, blood pressure was 20/90.  Otherwise, his exam was essentially benign.  He was planned for admission to telemetry.  He was to be placed on IV heparin and planned for Dr. Graciela Husbands to consult for EP.  HOSPITAL COURSE:  On October 256, 2001, we contacted [facility] to consider transfer.  However, Dr. Delena Serve was out of town until Friday.  We were then instructed by [facility] that even if the patient was transferred during this admission that they probably would not do any more than we are doing until a later date; therefore, the patient agreed to stay here at [facility] for now. For now we plan to hold flecainide, stop IV Cardizem, and begin IV amiodarone as this had converted the patient in the past.  However, the patient should not  be discharged home with amiodarone.  As well, the patient will need a Coumadin load.  On February 11, 2000, [name] Cook was continued on amiodarone for hopeful conversion to sinus rhythm.  As well, he was on IV heparin and Coumadin load.  However, Dr. Elsie Lincoln then saw the patient later on October 27.  It was felt that the patient very well may have been in atrial fibrillation/flutter for more than 48 hours before hospital entry.  Given the fact that IV amiodarone had previously converted him to sinus  rhythm which is not desirable for him, given that he had been in it for possibly more than 48 hours, we did not want him to pharmacologically cardiovert at that time because he could have a left atrial thrombus.  Dr. Elsie Lincoln then discussed the case with Dr. Graciela Husbands who had also seen [name] Cook in the past.  At that time it was planned to stop IV amiodarone, continue heparin, stop Coumadin, and planned a TEE on Monday or Tuesday by Dr. Jenne Campus.  As well, we would restart oral diltiazem and IV diltiazem.  We would increase beta-blocker.  The goal was to try to rate control and anticoagulate and rule out thrombus, not to cardiovert the patient.  On February 11, 2000, he was seen by Dr. Berton Mount of electrophysiology consultation.  He planned for rate control and agreed with Cardizem and low-dose beta-blocker.  He then also planned for a TEE with an ablation procedure.  Dr. Graciela Husbands would be away, but Dr. [name] would be available to perform the procedure.  On February 12, 2000, IV diltiazem was restarted for rate control.  The patient wanted to proceed with a flutter ablation with Dr. Ladona Ridgel while here at [facility].  This was scheduled for the following day.  On February 13, 2000, transesophageal echocardiogram was performed by Dr. Lenise Herald.  This revealed normal systolic function, EF 50%.  Anterior and anteroseptal hypokinesis.  There was a 1.5 x 0.3 cm echodensity noted in the left atrial appendage apex which probably represented prominent _______ and artifact; however, he could not exclude thrombus with absolute certainty.  Later, Dr. Ladona Ridgel noted the findings on TEE.  He felt that given his young age, good LV function, and normal right chamber dimensions that the risk of thrombus and embolic phenomenon was exceedingly low.  Therefore, he planned to proceed with EP study.   On February 13, 2000, [name] Cook underwent electrophysiology study by Dr. [name].  He was found to  have typical atrial flutter.  After ablation there was termination of atrial flutter and restoration of normal sinus rhythm and creation of directional block in the atrial flutter isthmus.  After the procedure Dr. Ladona Ridgel planned to restart heparin that night as well as Coumadin that night and for five weeks.  He would stop the Cardizem and continue Lopressor.  On February 14, 2000, [name] Cook was without complaints.  Temperature 96.7, pulse 77, blood pressure 116/85, oxygen saturation 98% on room air.  His INR was at 1.1.  His EKG revealed normal sinus rhythm, 83 beats per minute.  His lungs were clear.  His heart was in regular rhythm.  He also had lower extremity.  At this time he was on appropriate medications and was felt to be stable for discharge home.  He would be discharged with Lovenox and Coumadin anticoagulation coverage.  He would need to follow with Dr. [name] in four to six weeks.  It was felt that if  he were to develop any atrial fibrillation, that we would restart flecainide.  CONSULTATIONS:  Electrophysiology consultation with Dr. Berton Mount and Dr. [name] on February 11, 2000, for atrial flutter with rapid ventricular response.  PROCEDURES:  1. Transesophageal echocardiogram on February 13, 2000, by Dr. Lenise Herald.     This revealed:  (1) Normal LV dimensions and normal systolic function,     ejection fraction 50%.  There was anterior and anteroseptal hypokinesis.     (2) Mildly thickened mitral valve with trivial regurgitation.  (3) Normal     aortic valve with trivial regurgitation.  (4) Normal tricuspid valve with     mild regurgitation.  (5) There is a 1.5 x 0.3 cm echodensity noted in the     left atrial appendage apex which probably represents prominent _______     and artifact; however, could not exclude thrombus with absolute certainty.     (6) _______ descending thoracic aorta.  2. Electrophysiology study on February 13, 2000, with atrial flutter  ablation     performed by Dr. [name].  LABORATORY AND X-RAY DATA:  TSH 2.486.  Cardiac enzymes noted CK 86, MB 1.2, troponin 0.01.  Metabolic profile on February 09, 2000, shows sodium 138, potassium 3.8, glucose 98, BUN 13, creatinine 0.9.  Albumin 4.2, AST 2     7, ALT 43, ALP 80, total bili 0.8.  INR on February 10, 2000, through February 14, 2000, showed 0.9, 1.3, and 1.1.  CBC on February 09, 2000, showed WBC 13.3, hemoglobin 13.8, hematocrit 38.7, platelets 180, and on February 13, 2000, WBC 7.5, hemoglobin 13.6, hematocrit 38.5, platelets 169.  Radiology:  Chest x-ray on February 09, 2000, showed cardiomegaly.  No edematous or infiltrative changes.  EKG on February 14, 2000, showed normal sinus rhythm, 83 beats per minute, no significant abnormalities.  DISCHARGE MEDICATIONS:  1. Lovenox 100 mg subcu q.12h.  2. Coumadin which he is going to start on that Friday.  At that time he be     taking 7.5 mg on Friday and Saturday and then taking 5 mg each day.  3. Wellbutrin SR 150 mg one p.o. q.d.  4. Celebrex 200 mg one q.d.  5. _______ one q.d.  6. Zocor 40 mg one q.p.m.  7. Lopressor 50 mg t.i.d.  8. Stop taking aspirin.  9. Stop taking Cartia XT. 10. Stop taking Tambocor/flecainide.  DISCHARGE INSTRUCTIONS:  Low-salt low-fat low-cholesterol low-caffeine diet. May wash the site with water and soap.  Call the office at [phone] if any bleeding or increased size or pain of your area.  SPECIAL DISCHARGE INSTRUCTIONS:  Have blood drawn to check a PT/INR on Monday, November 5 and fax results to Dr. Elsie Lincoln.  FOLLOWUP:  He has an appointment to follow up with Dr. Elsie Lincoln in the [facility] office on November 8 at 2 oclock.  He has and appointment to follow up with Dr. [name].  He is to call to make an appointment with Dr. [name] at [phone] to make an appointment to see him in four to six weeks. DD:  03/01/00 TD:  03/01/00 Job: [id number]

## 2010-09-02 NOTE — Cardiovascular Report (Signed)
[place]  Patient:    [name] Cook, [name] Cook Visit Number: [id number] MRN: [id number]          Service Type: CAT Location: [facility] 2858 [id number] Attending Physician:  Darlin Priestly Dictated by:   Lenise Herald, M.D. Proc. Date: 12/25/00 Admit Date:  12/25/2000   CC:         Madaline Savage, M.D.   Cardiac Catheterization  PROCEDURE:  Insertion of a Reveal Plus loop recorder.  CARDIOLOGIST:  Lenise Herald, M.D.  COMPLICATIONS:  None.  ESTIMATED BLOOD LOSS:  Less than 10 cc.  INDICATIONS:  Mr. Reiland is a 60 year old white male patient of Dr. Lavonne Chick with a history of paroxysmal atrial fibrillation and recent near syncope.  He does have a history of atrial flutter ablation in the past.  The patient is now referred for insertion of a loop recorder to rule out any significant arrhythmias.  DESCRIPTION OF PROCEDURE:  After obtaining informed consent, the patient was brought to the cardiac catheterization lab in a fasting state.  His left chest was prepped and draped in a sterile fashion.  A 2 cm horizontal incision was made approximately 2 cm beneath the left clavicle and approximately 2 cm to the left of the sternal area in an area that had been previously mapped out by electrode testing to assure that there was good signal reception in this area. Lidocaine 1% was then used to anesthetize the incision site, and a long needle was then used to anesthetize the pocket.  Approximately a 2 cm incision was then performed in this area with blunt dissection down to the pectoral fascia. The pectoral plane was then bluntly dissected to create approximately 2 to 3 cm long pacemaker pocket which was approximately 2 cm wide.  Hemostasis was obtained, and the pocket was irrigated with kanamycin solution.  Two anchoring silk sutures were then placed at the proximal part of the pacer site, and the sutures were then passed through the Reveal device.  The device was  then delivered into the pocket, and hemostasis as confirmed.  The device was then sutured into the pocket with two silk sutures.  The pocket was then closed using running 2-0 Dexon suture to close the subcutaneous layer, and then a 5-0 running suture was used to close the skin.  The patient tolerated the procedure well and was then transported to the recovery room in satisfactory condition.  CONCLUSION:  Successful implant of Reveal Plus, Model Number 9526, Serial Number [id number] loop recorder without complication. Dictated by:   Lenise Herald, M.D. Attending Physician:  Darlin Priestly DD:  12/25/00 TD:  12/25/00 Job: [id number]

## 2010-09-02 NOTE — Op Note (Signed)
NAME:  [name] Cook, [name] Cook                             ACCOUNT NO.:  [id number]   MEDICAL RECORD NO.:  [id number]                   PATIENT TYPE:  OIB   LOCATION:  2854                                 FACILITY:  [facility]   PHYSICIAN:  Darlin Priestly, M.D.             DATE OF BIRTH:  May 02, 1950   DATE OF PROCEDURE:  02/07/2002  DATE OF DISCHARGE:                                 OPERATIVE REPORT   PROCEDURE:  Loop recorder explant.   ATTENDING SURGEON:  Darlin Priestly, M.D.   COMPLICATIONS:  None.   INDICATIONS:  The patient is a 60 year old male patient of Dr. Lavonne Chick  and Dr. Sharlot Gowda with a history of atrial fibrillation/flutter with a  history of atrial flutter ablation by Dr. Ladona Ridgel.  The patient continued to  have intermittent episodes of palpitations with presyncope and ultimately  underwent a loop recorder insertion on December 25, 2000.  He did have  several interrogations with a probable atrial flutter with rate of 160  however, he had not been seen in our office since March 2003.  We did see  him on February 03, 2002 with interrogation of his device with no significant  ectopy.  Device has now been in for 13 months and he is now brought for  elective loop recorder explant.   DESCRIPTION OF PROCEDURE:  After obtaining informed consent, the patient  brought to the cardiac cath lab in a fasting state.  Left anterior chest  wall was then prepped and draped in sterile fashion.  One percent lidocaine  was then used to anesthetize the site just above his previous incision.  Approximately a 1.5 cm horizontal incision was then carried out just  inferior to the previous incision and then blunt dissection was used to  carry this out down to the loop recorder site.  There was a moderate amount  of fibrous tissue overlying the loop recorder.  The capsule was then opened  using a scalpel and the sutures were then cut.  The device was then easily  removed.  The sutures were then  removed and the pocket was then irrigated  with 1% kanamycin solution.  Hemostasis was obtained and confirmed with  electrocautery.  The fascial layer was then closed using running 2-0 Dexon.  The skin layers then closed using running 5-0 Dexon.  Steri-Strips were then  applied.  The patient was transferred to recovery room in stable condition.    CONCLUSIONS:  Successful loop recorder explant.  Successful explant revealed  a Plus model 9526 serial number [id number] without complication.                                               Darlin Priestly, M.D.    RHM/MEDQ  D:  02/07/2002  T:  02/08/2002  Job:  [id number]   cc:   Sharlot Gowda, M.D.  [address]  [place], [place] [address]  Fax: [phone]   Madaline Savage, M.D.  [phone] [address], [address]  [place]  [place] [address]  Fax: [phone]

## 2010-09-02 NOTE — Procedures (Signed)
[place]  Patient:    [name] Cook, [name] Cook                          MRN: [id number] Proc. Date: 02/13/00 Adm. Date:  [id number] Attending:  Ophelia Shoulder CC:         Madaline Savage, M.D.  Kathrine Cords, R.N.   Procedure Report  PROCEDURE PERFORMED:  Electrophysiologic testing and radiofrequency catheter ablation of atrial flutter.  I. INTRODUCTION:  The patient is a very pleasant 60 year old man with a history of paroxysmal atrial flutter, who has been treated initially with medical therapy.  He presented to the hospital on Tambocor 100 mg twice a day and was in fact in atrial flutter.  The patient subsequently had his Tambocor discontinued and was tried on other drugs, but unfortunately continued in atrial flutter and for this reason was referred for electrophysiologic study and catheter ablation.  II. PROCEDURE:  After informed consent was obtained, the patient was taken to the diagnostic electrophysiology laboratory in the fasting state.  After the usual preparation and draping, intravenous fentanyl and midazolam were given for sedation.  A 6-French hexapolar catheter was inserted percutaneously into the right jugular vein and advanced to the coronary sinus.  A 5-French quadripolar catheter was inserted percutaneously into the right femoral vein and advanced to the His bundle region.  A 7-French twenty pole Halo catheter was inserted percutaneously into the right femoral vein and advanced to the right atrium.  A 7-French quadripolar ablation catheter was inserted percutaneously into the right femoral vein and advanced to the usual atrial flutter isthmus.  Mapping was subsequently carried out of the patients atrial flutter, which demonstrated typical counterclockwise-appearing tricuspid annular reentry.  The atrial flutter cycle length was 210 msec.  The earliest activation in the left atrium was at the proximal coronary sinus.  At  this point, the ablation catheter was maneuvered into the tricuspid isthmus.  Rapid atrial pacing was carried out from the ablation catheter, which demonstrated classic evidence for concealed entrainment.  At this point, with the circuit demonstrated to be isthmus dependent, the ablation catheter was maneuvered into the usual atrial flutter isthmus out to the septal leaflet of the tricuspid valve annulus.  A total of 12 RF energy applications were delivered between the tricuspid valve annulus and the eustachian ridge.  During the seventh RF energy application, atrial flutter terminated.  This was characterized as occurring in association with prolongation of the atrial flutter cycle length followed by termination of the atrial flutter.   The atrial flutter cycle length increased from 210 msec to 240 msec prior to termination.  Following this, pacing was carried out from the coronary sinus, which demonstrated residual isthmus conduction.  Two additional RF energy applications were subsequently delivered and this resulted in demonstration of block in the atrial flutter isthmus during coronary sinus pacing.  An additional four bonus RF energy applications were delivered and the patient was then observed for 45 minutes.  There was no evidence of any residual isthmus conduction.  The catheters were then removed, hemostasis assured and the patient returned to his room in good condition.  III. COMPLICATIONS:  None.  IV. RESULTS:  A. BASELINE 12 LEAD ECG:  The baseline 12 lead ECG demonstrated typical atrial    flutter with predominantly 2:1 A-V conduction.  B. BASELINE INTERVALS:  The atrial flutter cycle length was 210 msec.  The H-V    interval was  48 msec.  The QRS duration was 83 msec.  C. RAPID ATRIAL PACING:  Rapid atrial pacing following termination of    atrial flutter demonstrated an A-V Wenckebach cycle length of 330 msec.    The P-R interval remained less than the R-R  interval.  D. PROGRAMMED ATRIAL STIMULATION:  Programmed atrial stimulation was carried    out from the high right atrium with a basic drive cycle length of 161 msec.    The S1-S2 interval was stepwise decreased down to 280 msec, where the ERP    of the A-V node was observed.  It should be noted on S1-S2 cycle length of    500/290, there was prolongation of the A-H interval, but not diagnostically    for dual A-V nodal physiology.  E. ARRHYTHMIAS OBSERVED:  Atrial flutter.    Initiation:  present at the time of EP testing.    Duration:  sustained.  Termination:  With catheter ablation.    Cycle length: 210 msec.  F. MAPPING:  Mapping of the patients atrial flutter isthmus demonstrated    typical evidence of counterclockwise tricuspid annular reentry.  Pacing    during atrial flutter demonstrated evidence of concealed entrainment.  G. RADIOFREQUENCY ENERGY APPLICATION:  A total of six RF energy applications    were initially delivered to the atrial flutter isthmus, which resulted in    termination of atrial flutter and restoration of sinus rhythm.  There was    residual isthmus conduction and three additional RF energy applications    were delivered, which resulted in the creation of bidirectional block in    the atrial flutter isthmus.  Four bonus RF energy applications were then    delivered for a total of 12 RF energy applications delivered to the atrial    flutter isthmus with resulting terminations of atrial flutter, restoration    of sinus rhythm, and creation of bidirectional block in the atrial flutter    isthmus.   It should be noted, the patient was observed for 45 minutes and    had no evidence of residual isthmus conduction.  V. RESULTS/CONCLUSION:  This demonstrated successful catheter ablation of    typical atrial flutter with a total of 12 RF energy applications delivered    to the atrial flutter isthmus.  There were no immediate procedural    complications. DD:   02/13/00 TD:  02/13/00 Job: [id number]

## 2010-09-02 NOTE — Discharge Summary (Signed)
[place]. [facility]  Patient:    [name] Cook, [name] Cook                          MRN: [id number] Adm. Date:  [id number] Disc. Date: [id number] Attending:  Ruta Hinds Dictator:   Marya Fossa, P.A. CC:         Ronnald Nian, M.D.   Discharge Summary  ADMISSION DIAGNOSES: 1. Atrial fibrillation with rapid ventricular response. 2. Tobacco abuse.  DISCHARGE DIAGNOSES: 1. Atrial fibrillation spontaneously converted to normal sinus rhythm. 2. Cardiac catheterization revealing normal vessels except a 50% intermedius    ramus and normal left ventricle. 3. Tobacco abuse.  HISTORY OF PRESENT ILLNESS:  Patient is a 60 year old white male patient of Dr. Susann Givens without prior cardiac history seen in his office today and found to be in A-fib with rapid ventricular response.  Yesterday, around 6:30 a.m. he felt discomfort in his chest, lightheaded, presyncopal and was aware of his heartbeat and his legs felt weak.  He went to work anyway, felt bad by the end of the day, and went to bed early.  He awoke this morning feeling okay however once outside and working he felt short of breath, chest pain, heart racing, weak, and presyncopal.  He left work and went to Dr. Levonne Spiller office.  His EKG there revealed A-fib with rapid ventricular response and the patient was sent to [facility].  His heart rate upon arriving at the emergency room was in the 160s. Dr. Ethelda Chick, the ER physician attending, had already given him 20 mg of IV Cardizem without improvement in his heart rate.  PAST MEDICAL HISTORY:  Patients past medical history is relatively insignificant except for tobacco abuse.  ASSESSMENT AND PLAN:  The patient will now be admitted with A-fib with rapid ventricular response, question ischemic etiology.  He was given IV Cardizem 20 cc bolus without change, then IV Cardizem 10 cc bolus without change, then IV Cardizem drip at 10 cc/hr and IV heparin  per pharmacy with slight improvement in rate control.  We will plan heart cardiac catheterization and we will check cardiac enzymes to rule out myocardial infarction.  We have counseled him on smoking cessation and we will check a fasting lipid profile.  PROCEDURES:  Cardiac catheterization by Dr. Chanda Busing on December 02, 1999.  COMPLICATIONS:  None.  CONSULTATIONS:  None.  HOSPITAL COURSE:  Patient was admitted and placed on IV heparin per pharmacy protocol and IV Cardizem drip.  He was also given a nicotine patch.  The patient spontaneously converted to normal sinus rhythm during third shift on December 01, 1999.  He remained hemodynamically stable.  Laboratory studies are within normal limits and cardiac enzymes are negative.  His total cholesterol is 224, triglyceride 90, HDL 42, and LDL 164.  TSH was normal at 2.14.  We changed the patients medication from IV Cardizem to p.o. Cardizem as he was maintaining sinus rhythm.  We do not feel he needs Coumadin at this time as this may have been low in A-fib and would only use it if he had recurrent atrial fibrillation.  A 2-D echocardiogram was performed on December 02, 1999 and revealed a normal left ventricular function with an EF of 55-65%.  No significant valvular abnormalities were noted.  The patient was taken to the cardiac CATH lab on December 02, 1999 by Dr. Chanda Busing.  This was approached by the right groin without complication.  LV was normal.  Coronary vessels were normal with the exception of an intermedius ramus with a 50% mid lesion.  He recommended medical therapy with beta blockers and aspirin.  Patient tolerated the procedure well and deemed stable for discharge to home later that day.  He was screened but does not fit criteria for Prove-It Trial.  DISPOSITION:  The patient will be discharged to home.  DISCHARGE MEDICATIONS: 1. Toprol XL 50 mg q.d. 2. Aspirin 325 mg q.d.  DISCHARGE INSTRUCTIONS: 1. He  can return to work without restriction on Monday, December 12, 1999. 2. We have counseled him on smoking cessation. 3. He is to call the office for a follow-up appointment with Dr. Elsie Lincoln. DD:  01/19/00 TD:  01/20/00 Job: [id number]

## 2010-09-02 NOTE — Discharge Summary (Signed)
[place]. [facility]  Patient:    [name] Cook, [name] Cook                          MRN: [id number] Adm. Date:  [id number] Disc. Date: [id number] Attending:  Ruta Hinds Dictator:   Marya Fossa, P.A. CC:         Ronnald Nian, M.D.   Discharge Summary  ADMISSION DIAGNOSES: 1. Atrial fibrillation with rapid ventricular response - new. 2. Ongoing tobacco abuse.  DISCHARGE DIAGNOSES: 1. Atrial fibrillation with rapid ventricular response - spontaneously    converted to normal sinus rhythm. 2. Ongoing tobacco abuse. 3. Normal coronary arteries with the exception of 50% intermedius ramus and    normal left ventricular function.  HISTORY OF PRESENT ILLNESS:  The patient is a 60 year old white male patient of Dr. Susann Givens without prior cardiac history seen in his office today and found to be in atrial fibrillation with rapid ventricular response with heart rates in the 160s.  Yesterday around 6:30 in the morning he felt discomfort in his chest, lightheaded, presyncopal, unaware of heart beat with weak legs.  He went to work anyway, but felt poorly by the end of the day and went to bed early.  He awoke this morning feeling okay; however, once outside and working he started feeling short of breath.  He had mild chest pain, noticed his heart racing, felt weak and presyncopal.  He left work and went to Dr. Levonne Spiller office where an EKG revealed atrial fibrillation with rapid ventricular response.  He was then transferred to [facility] Emergency Room.  Dr. Ethelda Chick attended him in the emergency room initially and gave him 20 mg of Cardizem IV bolus without improvement in his heart rate.  In the emergency room EKG revealed atrial fibrillation with rapid ventricular response, heart rates in the 160s.  We gave him a total of IV Cardizem 20 cc bolus without change, then 10 cc bolus without change, then a Cardizem drip at 10 cc an hour.  The patient remained  hemodynamically stable.  We started IV heparin per pharmacy.  We will plan to check cardiac enzymes and plan cardiac catheterization for tomorrow to rule to out ischemic etiology.  We have also counseled him on tobacco cessation and will check a fasting lipid profile.  PROCEDURES:  Cardiac catheterization, December 02, 1999, by Dr. Chanda Busing.  COMPLICATIONS:  None.  CONSULTANTS:  None.  HOSPITAL COURSE:  The patient was admitted, placed on IV heparin, and IV Cardizem as described above.  He spontaneously converted to normal sinus rhythm on third shift December 01, 1999, and continued to be hemodynamically stable.  All laboratory studies were essentially within normal limits with the exception of his cholesterol profile:  Total cholesterol 224, triglyceride 90, HDL 42, and LDL 164.  TSH within normal limits.  Cardiac enzymes negative.  Dr. Elsie Lincoln proceeded with cardiac catheterization December 02, 1999, which revealed normal LV function, normal left main, LAD, and RCA, and a 50% intermedius ramus in its midportion.  He planned medical therapy with beta-blockers and aspirin and planned discharge to home.  The patient was screened for PROVE IT trial, but did not meet criteria.  DISCHARGE MEDICATIONS: 1. The patient will be discharged to home on aspirin 325 mg q.d. - we do not    feel at this time he is a Coumadin candidate; this may be a lone atrial    fibrillation episode.  However, if  he does have recurrence, we will    consider Coumadin at that time. 2. Toprol XL 50 mg.  DISCHARGE INSTRUCTIONS:  He may return to work without restriction Monday, December 12, 1999.  No smoking.  DISCHARGE FOLLOWUP:  He needs to call Dr. Elsie Lincoln for a two-week follow-up appointment. DD:  01/19/00 TD:  01/20/00 Job: [id number]

## 2010-09-02 NOTE — Discharge Summary (Signed)
[place]. [facility]  Patient:    [name] Cook, [name] Cook                          MRN: [id number] Adm. Date:  [id number] Disc. Date: [id number] Attending:  Ruta Hinds Dictator:   Halford Decamp Delanna Ahmadi, R.N., N.P. CC:         Nathen May, M.D., [facility]  Ronnald Nian, M.D.   Discharge Summary  HOSPITAL COURSE:   [name] is a 60 year old white separated male patient of Dr. Elsie Lincoln who was admitted by Dr. Alanda Amass on January 08, 2000, secondary to atrial flutter with a rapid ventricular response.  He was seen in the emergency room.  Heart rate was 159.  He was in atrial flutter with 2:1 block.  He was given IV Adenocard 6 mg.  He was then given a 12 mg bolus.  His heart rate did not change.  He was then given IV Cardizem 25 mg with no change in his heart rate.  He was then started on a drip at 10 cc per hour.  He was then given another 30 mg IV bolus of Cardizem without any change.  He was started on heparin.  He was then given esmolol bolus.  Drip was started apparently without any change in his heart rate.  He was then DC cardioverted. He received two shocks, one at 50 and 150.  He then went into normal sinus rhythm.  However, he then had recurrent atrial flutter.  His esmolol drip was continued and he was begun on IV amiodarone.  His esmolol drip was weaned off after IV amiodarone was started.  He then converted to a a sinus rhythm.  An EP consult was called on January 09, 2000.  He was seen by Dr. Sherryl Manges who recommended either anti-arrhythmic therapy or RIFCA.  The patient talked with his family about this and he expressed desire to go to [facility] for combined fibrillation/flutter ablation.   His amiodarone was discontinued.  He was started back on a calcium channel blocker and Tambocor per Dr. Koren Bound recommendations.  On January 13, 2000, he was maintained in sinus rhythm at a rate of 78.  His blood pressure is 128/78.  His temperature  was 96.5, his room air saturations were 97%.  He was considered stable to be discharged to home.  He was seen by Dr. Elsie Lincoln and discharged.  LABORATORY DATA:  On January 10, 2000, his hemoglobin was 11.5, hematocrit 33.4, and WBC 6.4.  His platelets were 165,000.  His sodium was 141, potassium was 3.8, chloride 104, CO2 30, glucose 108, BUN was 8, creatinine was 7, AST was 32, ALT was 37.  Other LFTs were within normal limits.  His magnesium was 2.1.  Total cholesterol was 100, triglycerides 33, HDL 30 and LDL was 63.  The CK-MB x 1 was negative, and troponin was negative.  TSH was 1.175.  Chest x-ray showed his heart was enlarged with an abnormal lung density consistent with mild edema.  Viral pneumonitis could have a similar pattern, and no effusion was noted.  DISCHARGE MEDICATIONS: 1. Enteric coated aspirin 325 mg once per day. 2. Zocor 40 mg at bedtime. 3. Wellbutrin SR 150 mg once per day. 4. Celebrex 200 mg twice per day. 5. Tambocor 100 mg twice per day. 6. Cardizem CD 240 mg once per day.  DISCHARGE INSTRUCTIONS: 1. Activities as tolerated. 2. His diet is low  fat. 3. He was instructed to quit smoking. 4. He will follow up with Dr. Elsie Lincoln in approximately two weeks and    Dr. Alanda Amass will call Dr. Delena Serve at [facility] to make an appointment for    possible atrial fibrillation/flutter ablation.  ASSESSMENT: 1. Possible atrial flutter with rapid ventricular response.  Difficult to    convert with the recent admissions, one on December 01, 1999, at which time    he converted with multiple boluses and a drip of intravenous Cardizem.    This admission, he did not convert after intravenous Adenocard, Cardizem or    esmolol.  He eventually had direct current cardioversion at which time he    converted to sinus rhythm.  However, he had recurrent atrial flutter with    rapid ventricular response at which time he was started on IV amiodarone    with conversion to sinus rhythm. 2.  Mild coronary artery disease, status post cardiac catheterization on    December 02, 1999, by Dr. Elsie Lincoln with normal left ventricle.  The left main,    right coronary artery, and left anterior descending were all normal.  He    had a 50% mid intermittent ramus. 3. Mildly increased left ventricular wall thickness per two dimensional    echocardiogram done December 02, 1999.  Also, no left ventricular regional    wall motion abnormality.  His ejection fraction was 55-65%.  His valves    were all normal.  There was no effusion, and his left atrium size was    normal, and his right atrium was normal. 4. Tobacco use. 5. Low HDL, however, his overall total cholesterol is also low. DD:  01/17/00 TD:  01/17/00 Job: [id number]

## 2010-09-09 ENCOUNTER — Observation Stay (HOSPITAL_COMMUNITY)
Admission: EM | Admit: 2010-09-09 | Discharge: 2010-09-10 | Disposition: A | Discharge status: Home / Self Care | Payer: Medicaid Other | Source: Ambulatory Visit | Attending: Internal Medicine | Admitting: Internal Medicine

## 2010-09-09 DIAGNOSIS — E785 Hyperlipidemia, unspecified: Secondary | ICD-10-CM | POA: Insufficient documentation

## 2010-09-09 DIAGNOSIS — E119 Type 2 diabetes mellitus without complications: Secondary | ICD-10-CM | POA: Insufficient documentation

## 2010-09-09 DIAGNOSIS — R002 Palpitations: Principal | ICD-10-CM | POA: Insufficient documentation

## 2010-09-09 DIAGNOSIS — Z79899 Other long term (current) drug therapy: Secondary | ICD-10-CM | POA: Insufficient documentation

## 2010-09-09 DIAGNOSIS — I4891 Unspecified atrial fibrillation: Secondary | ICD-10-CM | POA: Insufficient documentation

## 2010-09-09 DIAGNOSIS — I10 Essential (primary) hypertension: Secondary | ICD-10-CM | POA: Insufficient documentation

## 2010-09-10 ENCOUNTER — Emergency Department (HOSPITAL_COMMUNITY): Payer: Medicaid Other

## 2010-09-10 LAB — CBC
HCT: 39.1 % (ref 39.0–52.0)
Hemoglobin: 13.2 g/dL (ref 13.0–17.0)
MCH: 31.2 pg (ref 26.0–34.0)
MCHC: 33.8 g/dL (ref 30.0–36.0)
MCV: 92.4 fL (ref 78.0–100.0)
Platelets: 125 10*3/uL — ABNORMAL LOW (ref 150–400)
RBC: 4.23 MIL/uL (ref 4.22–5.81)
RDW: 13.3 % (ref 11.5–15.5)
WBC: 6.6 10*3/uL (ref 4.0–10.5)

## 2010-09-10 LAB — BASIC METABOLIC PANEL
BUN: 14 mg/dL (ref 6–23)
CO2: 26 mEq/L (ref 19–32)
Calcium: 9.8 mg/dL (ref 8.4–10.5)
Chloride: 104 mEq/L (ref 96–112)
Creatinine, Ser: 0.76 mg/dL (ref 0.4–1.5)
GFR calc Af Amer: >60 mL/min (ref >60)
GFR calc non Af Amer: >60 mL/min (ref >60)
Glucose, Bld: 92 mg/dL (ref 70–99)
Potassium: 3.6 mEq/L (ref 3.5–5.1)
Sodium: 138 mEq/L (ref 135–145)

## 2010-09-10 LAB — DIFFERENTIAL
Basophils Absolute: 0 10*3/uL (ref 0.0–0.1)
Basophils Relative: 1 % (ref 0–1)
Eosinophils Absolute: 0.3 10*3/uL (ref 0.0–0.7)
Eosinophils Relative: 4 % (ref 0–5)
Lymphocytes Relative: 47 % — ABNORMAL HIGH (ref 12–46)
Lymphs Abs: 3.1 10*3/uL (ref 0.7–4.0)
Monocytes Absolute: 0.5 10*3/uL (ref 0.1–1.0)
Monocytes Relative: 8 % (ref 3–12)
Neutro Abs: 2.7 10*3/uL (ref 1.7–7.7)
Neutrophils Relative %: 41 % — ABNORMAL LOW (ref 43–77)

## 2010-09-10 LAB — APTT: aPTT: 26 seconds (ref 24–37)

## 2010-09-10 LAB — CK TOTAL AND CKMB (NOT AT ARMC)
CK, MB: 1.8 ng/mL (ref 0.3–4.0)
Relative Index: INVALID (ref 0.0–2.5)
Total CK: 87 U/L (ref 7–232)

## 2010-09-10 LAB — CARDIAC PANEL(CRET KIN+CKTOT+MB+TROPI)
CK, MB: 1.9 ng/mL (ref 0.3–4.0)
Relative Index: INVALID (ref 0.0–2.5)
Total CK: 80 U/L (ref 7–232)
Troponin I: <0.3 ng/mL (ref <0.30)

## 2010-09-10 LAB — PROTIME-INR
INR: 0.96 (ref 0.00–1.49)
Prothrombin Time: 13 seconds (ref 11.6–15.2)

## 2010-09-10 LAB — TROPONIN I: Troponin I: <0.3 ng/mL (ref <0.30)

## 2010-09-10 NOTE — Discharge Summary (Signed)
NAMECEYLON, ARENSON NO.:  [id number]  MEDICAL RECORD NO.:  [id number]           PATIENT TYPE:  O  LOCATION:  A214                          FACILITY:  [facility]  PHYSICIAN:  Talmage Nap, MD  DATE OF BIRTH:  02-May-1950  DATE OF ADMISSION:  09/09/2010 DATE OF DISCHARGE:  05/26/2012LH                         DISCHARGE SUMMARY-REFERRING   ADMITTING PHYSICIAN:  Tarry Kos, MD  DISCHARGE DIAGNOSES: 1. Palpitations, resolved. 2. Atrial fibrillation, ventricular rate controlled. 3. Diabetes mellitus. 4. Hypertension. 5. Hyperlipidemia.  The patient is a 60 year old Caucasian male with history of atrial fibrillation with RVR on Tambocor as well as metoprolol and also has a history of hypertension and hyperlipidemia  was admitted to the hospital on Sep 09, 2010, by Dr. Tarry Kos because of palpitation with some left arm pain.  There was no history of shortness of breath. There was no associated nausea.  There was no vomiting or diaphoresis. No associated fever, chills, or rigor.  Subsequently, the patient was admitted with an impression of atypical chest pain with palpitations to be ruled out for ACS.  His preadmission meds include: 1. Metoprolol 50 mg p.o. b.i.d. 2. Pravastatin 40 mg p.o. daily. 3. Cardizem 180 mg p.o. daily. 4. Fish oil 2 capsules p.o. daily. 5. Tambocor 50 mg p.o. b.i.d.  The patient was, however, said to have     been out of this medication for about a week. 6. Celexa 40 mg p.o. daily. 7. Lantus insulin 10 units subcu at bedtime. 8. Metformin 1000 mg p.o. b.i.d. 9. Ranitidine 150 mg p.o. daily.  ALLERGIES:  No known allergies.  SOCIAL HISTORY:  Positive for tobacco use.  Denies any history of alcohol or street drug use.  PAST SURGICAL HISTORY: 1. Cardiac catheterization in 2001 with normal coronaries. 2. Atrial fibrillation, status post ablation.  REVIEW OF SYSTEMS:  Essentially as documented in the initial history  and physical.  At that time, the patient was seen by the admitting physician.  PHYSICAL EXAMINATION:  VITAL SIGNS:  Temperature was 97.8.  He was said to be saturating 96% on room air.  Blood pressure 136/96.  Pulse 74. Respiratory rate 20. HEENT:  Pupils were reactive to light and extraocular muscles are intact. NECK:  No jugular venous distention.  No carotid bruit.  No lymphadenopathy. CHEST:  Clear to auscultation. HEART:  Heart sounds are one and two. ABDOMEN:  Soft, nontender.  Liver, spleen, and kidney are palpable. Bowel sounds are positive. EXTREMITIES:  No pedal edema. NEUROLOGIC:  Nonfocal. MUSCULOSKELETAL:  Unremarkable. SKIN:  Normal turgor.  LABORATORY DATA:  Initial hematological indices showed WBC of 6.6, hemoglobin of 13.2, hematocrit of 39.1, MCV of 92.4, platelet count of 125, neutrophils 41%, and lymphocytes 47%.  Coagulation profile showed PT 13.0, INR 0.96, and APTT of 26.  Cardiac marker:  Troponin I less than 0.30 and less than 0.30, all within normal range.  Basic metabolic panel showed sodium of 138, potassium of 3.6, chloride 104, bicarb of 26, glucose is 92, BUN is 14, creatinine 0.76.  EKG done on admission was said to have showed normal sinus rhythm  with no acute ST wave change.  Imaging studies done include chest x-ray which was normal, but stable cardiomegaly.  The patient had a 2-D echo done in January 2012 and this showed mild LVH, EF of 65-70% with grade 1 diastolic dysfunction.  HOSPITAL COURSE:  The patient was admitted to Telemetry and he was given aspirin 325 mg p.o. stat and subsequently aspirin 81 mg p.o. daily and he was saline locked.  From the initial history and physical, the patient was said to have been evaluated by the cardiologist on-call on admission and his recommendation was if cardiac enzymes were negative the patient should be discharged home.  The patient was seen by me for the very first time in this admission today which  is Sep 10, 2010. Denies any chest pain.  Denies palpitations.  Examination of the patient was essentially unremarkable, however, he complained about him being out of his Tambocor and the nurse taking care of the patient is working on getting the patient some medication prior to discharge.  Examination of patient unremarkable.  His vital signs, blood pressure is 144/87, temperature is 97.5, pulse 66, respiratory rate is 20.  Medically stable.  Plan is for the patient to be discharged home today with activity as tolerated.  Low-sodium, low-cholesterol diet.  He is to follow up with his primary care physician and his cardiologist in 1-2 weeks.  Medication to be taken at home will include all his home meds.  It is important to mention that the patient's home meds were not listed in the e-chart, however, from the initial history and physical the patient should continue the following medications: 1. Metoprolol 50 mg p.o. b.i.d. 2. Pravastatin 40 mg p.o. daily. 3. Cardizem 180 mg p.o. daily. 4. Fish oil 2 capsules p.o. daily. 5. Tambocor 50 p.o. b.i.d. 6. Celexa 40 mg p.o. daily. 7. Lantus insulin subcu at bedtime. 8. Metformin 1000 mg p.o. b.i.d. 9. Ranitidine 150 mg p.o. daily.     Talmage Nap, MD     CN/MEDQ  D:  09/10/2010  T:  09/10/2010  Job:  [id number]  cc:   Primary care physician  Electronically Signed by Talmage Nap  on 09/10/2010 01:58:43 PM

## 2010-09-16 NOTE — H&P (Signed)
NAME:  [name] Cook, [name] Cook NO.:  [id number]  MEDICAL RECORD NO.:  [id number]           PATIENT TYPE:  LOCATION:                                 FACILITY:  PHYSICIAN:  Tarry Kos, MD       DATE OF BIRTH:  10-19-1950  DATE OF ADMISSION: DATE OF DISCHARGE:  LH                             HISTORY & PHYSICAL   CHIEF COMPLAINT:  Palpitations.  HISTORY OF PRESENT ILLNESS:  Mr. Route is a 60 year old male, who presented to the ED because of palpitations.  He has a history of AFib with RVR, diabetes, hypertension, hyperlipidemia, and denies any shortness of breath or chest pain.  He started having some palpitations and some left arm pain that was thought to be an anginal equivalent by the cardiologist on-call tonight, and was advised for [place] to rule him out overnight.  Currently, his left arm pain has gone.  His palpitations were also gone.  He has been in normal sinus rhythm since he has been here.  He has run out of his Tambocor recently from the health department, but other than that, he denies any shortness of breath, and all of his symptoms apparently resolved.  REVIEW OF SYSTEMS:  Otherwise negative.  MEDICATIONS: 1. Metoprolol 50 mg twice a day. 2. Pravastatin 40 mg daily. 3. Cardizem 180 mg daily. 4. Fish oil 2 capsules daily. 5. Tambocor 50 mg twice a day.  He ran out of this last week. 6. Celexa 40 mg daily. 7. Lantus 10 units at bedtime. 8. Metformin 1000 mg twice a day. 9. Ranitidine 150 mg daily.  PAST MEDICAL HISTORY:  As above.  He also has had a heart cath in 2001, which did not show any coronary artery disease.  He does not have a history of having any stents placed.  He is status post ablation also back in 2001.  SOCIAL:  He smokes.  No alcohol.  No IV drug abuse.  FAMILY HISTORY:  Noncontributory.  ALLERGIES:  None.  PHYSICAL EXAMINATION:  VITAL SIGNS:  Afebrile, 97.8, 96% O2 sats on room air, blood pressure 136/96, pulse 74,  respirations 20. GENERAL:  Alert and oriented x4, in no apparent distress, cooperative, friendly. HEENT:  Extraocular muscles intact.  Pupils are equal and reactive to light.  Oropharynx is clear.  Mucous membranes are moist. NECK:  No JVD.  No carotid bruits. CARDIAC:  Regular rate and rhythm without murmurs or gallops. CHEST:  Clear to auscultation bilaterally.  No wheezes, rhonchi, or rales. ABDOMEN:  Soft, nontender, nondistended.  Positive bowel sounds.  No hepatosplenomegaly. EXTREMITIES:  No clubbing, cyanosis, or edema. PSYCH:  Normal affect. NEURO:  No focal neurologic deficits. SKIN:  No rashes.  EKG is normal sinus rhythm without any acute ST- or T-wave changes. Initial cardiac enzymes were negative.  Chest x-ray is normal beside some mild cardiomegaly.  BMP is normal.  CBC is normal, except the platelet count of 125.  ASSESSMENT AND PLAN:  A 60 year old male with possible anginal equivalent. 1. Rule out acute coronary syndrome, obtain serial cardiac enzymes.  Observe him overnight.  He is asymptomatic at this time and have     him have a close outpatient follow-up with the health department. 2. History of atrial fibrillation with rapid ventricular response.     This is currently rate controlled on his oral medications, continue     those. 3. The patient is a full code.  Further recommendations depending on     overall hospital course.  Once he is ruled out, he will probably     able to be discharged later on today.  He will need a refill for     his Tambocor.                                           ______________________________ Tarry Kos, MD     RD/MEDQ  D:  09/10/2010  T:  09/10/2010  Job:  [id number]  Electronically Signed by Tarry Kos MD on 09/16/2010 11:48:59 AM

## 2010-10-24 ENCOUNTER — Other Ambulatory Visit: Payer: Self-pay | Admitting: *Deleted

## 2010-10-24 ENCOUNTER — Telehealth: Payer: Self-pay | Admitting: Cardiology

## 2010-10-24 MED ORDER — DILTIAZEM HCL ER COATED BEADS 180 MG PO CP24: 180.0000 mg | ORAL_CAPSULE | Freq: Every day | ORAL | Status: DC

## 2010-10-24 NOTE — Telephone Encounter (Signed)
Patient needs cardiazem 180mg  called to [facility] for 1 mth supply / tg

## 2010-10-27 ENCOUNTER — Encounter: Payer: Self-pay | Admitting: *Deleted

## 2010-10-27 ENCOUNTER — Telehealth: Payer: Self-pay | Admitting: Cardiology

## 2010-10-27 MED ORDER — ESCITALOPRAM OXALATE 20 MG PO TABS: 20.0000 mg | ORAL_TABLET | Freq: Every day | ORAL | Status: DC

## 2010-10-27 NOTE — Telephone Encounter (Signed)
Patient states that he needs Lexapro 25mg  sent to [facility] / tg

## 2010-11-03 ENCOUNTER — Other Ambulatory Visit: Payer: Self-pay | Admitting: Cardiology

## 2010-11-04 LAB — COMPREHENSIVE METABOLIC PANEL
ALT: 18 U/L (ref 0–53)
AST: 17 U/L (ref 0–37)
Albumin: 4.4 g/dL (ref 3.5–5.2)
Alkaline Phosphatase: 53 U/L (ref 39–117)
BUN: 23 mg/dL (ref 6–23)
CO2: 25 mEq/L (ref 19–32)
Calcium: 9.3 mg/dL (ref 8.4–10.5)
Chloride: 102 mEq/L (ref 96–112)
Creat: 0.84 mg/dL (ref 0.50–1.35)
Glucose, Bld: 110 mg/dL — ABNORMAL HIGH (ref 70–99)
Potassium: 4.8 mEq/L (ref 3.5–5.3)
Sodium: 140 mEq/L (ref 135–145)
Total Bilirubin: 0.5 mg/dL (ref 0.3–1.2)
Total Protein: 6.4 g/dL (ref 6.0–8.3)

## 2010-11-04 LAB — MAGNESIUM: Magnesium: 1.7 mg/dL (ref 1.5–2.5)

## 2010-11-06 ENCOUNTER — Encounter: Payer: Self-pay | Admitting: *Deleted

## 2010-11-09 ENCOUNTER — Ambulatory Visit (INDEPENDENT_AMBULATORY_CARE_PROVIDER_SITE_OTHER): Payer: Medicaid Other | Admitting: Adult Health

## 2010-11-09 ENCOUNTER — Encounter: Payer: Self-pay | Admitting: Adult Health

## 2010-11-09 VITALS — BP 123/79 | HR 57 | Ht 73.0 in | Wt 240.0 lb

## 2010-11-09 DIAGNOSIS — E119 Type 2 diabetes mellitus without complications: Secondary | ICD-10-CM

## 2010-11-09 DIAGNOSIS — I480 Paroxysmal atrial fibrillation: Secondary | ICD-10-CM

## 2010-11-09 DIAGNOSIS — E785 Hyperlipidemia, unspecified: Secondary | ICD-10-CM

## 2010-11-09 DIAGNOSIS — I4891 Unspecified atrial fibrillation: Secondary | ICD-10-CM

## 2010-11-09 MED ORDER — MAGNESIUM OXIDE 400 MG PO TABS: 400.0000 mg | ORAL_TABLET | Freq: Every day | ORAL | Status: DC

## 2010-11-09 NOTE — Assessment & Plan Note (Signed)
He is followed by  Health dept and [facility] for monitoring of this and dosing of medications.

## 2010-11-09 NOTE — Patient Instructions (Signed)
You will follow up in 6 months with Dr. [name]. You will receive a reminder letter in the mail about 1-2 months in advance. If you don't receive this letter,please contact our office. You will start magnesium 400mg  daily. This prescription has been sent to your pharmacy.

## 2010-11-09 NOTE — Progress Notes (Signed)
HPI: 60 y/o patient of Dr. [name] we are following for ongoing assessment and treatment of PAF, hypertension, hyperlipidemia.  He is currently disabled secondary to severe arthritis, and does not have a primary care physician.  [facility] de[facility] for medical issues.He comes today without complaint. He denies palpitations, dizziness, DOE, or chest discomfort. He is medically compliant.  He has had no hospitalizations or ER visits.  No Known Allergies  Current Outpatient Prescriptions  Medication Sig Dispense Refill  . aspirin 81 MG tablet Take 81 mg by mouth daily.        . celecoxib (CELEBREX) 200 MG capsule Take 200 mg by mouth daily.        Marland Kitchen diltiazem (CARDIZEM CD) 180 MG 24 hr capsule Take 1 capsule (180 mg total) by mouth daily.  30 capsule  1  . escitalopram (LEXAPRO) 20 MG tablet Take 1 tablet (20 mg total) by mouth daily.  30 tablet  1  . esomeprazole (NEXIUM) 40 MG capsule Take 40 mg by mouth daily before breakfast.        . magnesium oxide (MAG-OX) 400 MG tablet Take 400 mg by mouth daily.        . metoprolol (LOPRESSOR) 50 MG tablet Take 50 mg by mouth 2 (two) times daily.        . propafenone (RYTHMOL SR) 225 MG 12 hr capsule Take 225 mg by mouth 2 (two) times daily.        . rosuvastatin (CRESTOR) 20 MG tablet Take 20 mg by mouth daily.        . sitaGLIPtan-metformin (JANUMET) 50-1000 MG per tablet Take 1 tablet by mouth 2 (two) times daily with a meal.        . fish oil-omega-3 fatty acids 1000 MG capsule Take 1 g by mouth daily.        . insulin glargine (LANTUS) 100 UNIT/ML injection Inject 10 Units into the skin at bedtime.        . Multiple Vitamin (MULTIVITAMIN) tablet Take 1 tablet by mouth daily.        . Testosterone Propionate 2 % CREA Place onto the skin daily.        . vitamin B-12 (CYANOCOBALAMIN) 100 MCG tablet Take 100 mcg by mouth daily.          Past Medical History  Diagnosis Date  . Palpitation   . Atrial  fibrillation     ventricular rate controlled  . Diabetes insipidus   . Hypertension   . Hyperlipidemia     Past Surgical History  Procedure Date  . Cardiac catheterization 2001    normal coronaries  . Atrial ablation surgery 2001    ROS: Review of systems complete and found to be negative unless listed abovePHYSICAL EXAM BP 123/79  Pulse 57  Ht 6\' 1"  (1.854 m)  Wt 240 lb (108.863 kg)  BMI 31.66 kg/m2 General: Well developed, well nourished, in no acute distress Head: Eyes PERRLA, No xanthomas.   Normal cephalic and atramatic  Lungs: Clear bilaterally to auscultation and percussion. Heart: HRRR S1 S2,  Pulses are 2+ & equal.            No carotid bruit. No JVD.  No abdominal bruits. No femoral bruits. Abdomen: Bowel sounds are positive, abdomen soft and non-tender without masses or                  Hernia's noted. Msk:  Back normal, normal gait. Normal strength and tone for age. Extremities: No clubbing, cyanosis or edema.  DP +1 Neuro: Alert and oriented X 3. Psych:  Good affect, responds appropriately  EKG: Sinus brady rate of 58 bpm.  PR , QTC 429 ms.  ASSESSMENT AND PLAN

## 2010-11-09 NOTE — Assessment & Plan Note (Addendum)
Review of his EKG demonstrates sinus bradycardia and he is asymptomatic for palpitations or rapid HR.  I will make no changes at this time. He will see [place] in 6 months unless he becomes symptomatic. I have noted Mg level of 1.7.  I have added Mag Ox 400mg  daily to his med regimen.

## 2010-11-09 NOTE — Assessment & Plan Note (Signed)
This has not been checked in several months. I have advised him to have this drawn via his  [facility] provider [facility]. He is seeing them next month.

## 2010-11-15 ENCOUNTER — Ambulatory Visit: Payer: Medicaid Other | Admitting: Cardiology

## 2010-12-14 ENCOUNTER — Other Ambulatory Visit: Payer: Self-pay | Admitting: *Deleted

## 2010-12-14 MED ORDER — ESOMEPRAZOLE MAGNESIUM 40 MG PO CPDR: 40.0000 mg | DELAYED_RELEASE_CAPSULE | Freq: Every day | ORAL | Status: DC

## 2011-01-16 ENCOUNTER — Other Ambulatory Visit: Payer: Self-pay | Admitting: Cardiology

## 2011-02-16 ENCOUNTER — Other Ambulatory Visit: Payer: Self-pay | Admitting: Cardiology

## 2011-02-17 NOTE — Telephone Encounter (Signed)
Patient states he has primary care physician at this time and has requested that they refill these medications.

## 2011-03-27 ENCOUNTER — Other Ambulatory Visit: Payer: Self-pay

## 2011-03-27 ENCOUNTER — Encounter (HOSPITAL_COMMUNITY): Payer: Self-pay

## 2011-03-27 ENCOUNTER — Inpatient Hospital Stay (HOSPITAL_COMMUNITY)
Admission: EM | Admit: 2011-03-27 | Discharge: 2011-03-30 | DRG: 310 | Disposition: A | Discharge status: Home / Self Care | Payer: Medicaid Other | Attending: Internal Medicine | Admitting: Internal Medicine

## 2011-03-27 ENCOUNTER — Emergency Department (HOSPITAL_COMMUNITY): Payer: Medicaid Other

## 2011-03-27 ENCOUNTER — Other Ambulatory Visit: Payer: Self-pay | Admitting: Cardiology

## 2011-03-27 DIAGNOSIS — R0600 Dyspnea, unspecified: Secondary | ICD-10-CM

## 2011-03-27 DIAGNOSIS — I2510 Atherosclerotic heart disease of native coronary artery without angina pectoris: Secondary | ICD-10-CM | POA: Diagnosis present

## 2011-03-27 DIAGNOSIS — I4891 Unspecified atrial fibrillation: Secondary | ICD-10-CM

## 2011-03-27 DIAGNOSIS — K219 Gastro-esophageal reflux disease without esophagitis: Secondary | ICD-10-CM

## 2011-03-27 DIAGNOSIS — E119 Type 2 diabetes mellitus without complications: Secondary | ICD-10-CM

## 2011-03-27 DIAGNOSIS — R0609 Other forms of dyspnea: Secondary | ICD-10-CM

## 2011-03-27 DIAGNOSIS — F17200 Nicotine dependence, unspecified, uncomplicated: Secondary | ICD-10-CM | POA: Diagnosis present

## 2011-03-27 DIAGNOSIS — I480 Paroxysmal atrial fibrillation: Secondary | ICD-10-CM

## 2011-03-27 DIAGNOSIS — E785 Hyperlipidemia, unspecified: Secondary | ICD-10-CM

## 2011-03-27 DIAGNOSIS — I10 Essential (primary) hypertension: Secondary | ICD-10-CM | POA: Diagnosis present

## 2011-03-27 DIAGNOSIS — I2589 Other forms of chronic ischemic heart disease: Secondary | ICD-10-CM | POA: Diagnosis present

## 2011-03-27 LAB — POCT I-STAT TROPONIN I
Troponin i, poc: 0 ng/mL (ref 0.00–0.08)
Troponin i, poc: 0 ng/mL (ref 0.00–0.08)

## 2011-03-27 LAB — CBC
HCT: 40.6 % (ref 39.0–52.0)
Hemoglobin: 13.8 g/dL (ref 13.0–17.0)
MCH: 31.9 pg (ref 26.0–34.0)
MCHC: 34 g/dL (ref 30.0–36.0)
MCV: 93.8 fL (ref 78.0–100.0)
Platelets: 147 10*3/uL — ABNORMAL LOW (ref 150–400)
RBC: 4.33 MIL/uL (ref 4.22–5.81)
RDW: 13.8 % (ref 11.5–15.5)
WBC: 9 10*3/uL (ref 4.0–10.5)

## 2011-03-27 LAB — BASIC METABOLIC PANEL
BUN: 13 mg/dL (ref 6–23)
CO2: 25 mEq/L (ref 19–32)
Calcium: 9.9 mg/dL (ref 8.4–10.5)
Chloride: 102 mEq/L (ref 96–112)
Creatinine, Ser: 0.91 mg/dL (ref 0.50–1.35)
GFR calc Af Amer: >90 mL/min (ref >90)
GFR calc non Af Amer: >90 mL/min (ref >90)
Glucose, Bld: 87 mg/dL (ref 70–99)
Potassium: 3.8 mEq/L (ref 3.5–5.1)
Sodium: 138 mEq/L (ref 135–145)

## 2011-03-27 LAB — DIFFERENTIAL
Basophils Absolute: 0.1 10*3/uL (ref 0.0–0.1)
Basophils Relative: 1 % (ref 0–1)
Eosinophils Absolute: 0.3 10*3/uL (ref 0.0–0.7)
Eosinophils Relative: 3 % (ref 0–5)
Lymphocytes Relative: 31 % (ref 12–46)
Lymphs Abs: 2.8 10*3/uL (ref 0.7–4.0)
Monocytes Absolute: 0.8 10*3/uL (ref 0.1–1.0)
Monocytes Relative: 9 % (ref 3–12)
Neutro Abs: 5.1 10*3/uL (ref 1.7–7.7)
Neutrophils Relative %: 56 % (ref 43–77)

## 2011-03-27 MED ORDER — DILTIAZEM HCL 100 MG IV SOLR
5.0000 mg/h | INTRAVENOUS | Status: DC
  Administered 2011-03-27: 5 mg/h via INTRAVENOUS
  Filled 2011-03-27: qty 100

## 2011-03-27 NOTE — ED Provider Notes (Signed)
History   This chart was scribed for Joya Gaskins, MD scribed by Magnus Sinning. The patient was seen in room APA15/[facility]15    CSN: [id number] Arrival date & time: 03/27/2011  4:53 PM   First MD Initiated Contact with Patient 03/27/11 1808    Chief Complaint  Patient presents with  . Shortness of Breath   Pt seen at 6:08 PM  Patient is a 60 y.o. male presenting with shortness of breath. The history is provided by the patient. No language interpreter was used.  Shortness of Breath  The current episode started 3 to 5 days ago. The onset was sudden. The problem has been unchanged. The problem is moderate. The symptoms are aggravated by a supine position and activity. Associated symptoms include cough and shortness of breath. Pertinent negatives include no chest pain, no fever and no sore throat.  Pt c/o SOB w/cough for the past 2-3 days. Pt has h/o atrial fibrillation and says he has experienced similar Sx in the past when his heart has gone into a-fib. Pt denies any CP, palpations, near syncope, and n/v/d. Pt denies h/o of MI, asthma, or COPD. Pt sees [place] [facility], was last seen 7/12, and was not in a-fib during last visit. Pt does not take coumadin. Takes all home medications as prescribed.   Past Medical History  Diagnosis Date  . Palpitation   . Atrial fibrillation     ventricular rate controlled  . Diabetes insipidus   . Hypertension   . Hyperlipidemia     Past Surgical History  Procedure Date  . Cardiac catheterization 2001    normal coronaries  . Atrial ablation surgery 2001    No family history on file.  History  Substance Use Topics  . Smoking status: Current Everyday Smoker -- 0.5 packs/day for 42 years    Types: Cigarettes  . Smokeless tobacco: Never Used  . Alcohol Use: No      Review of Systems  Constitutional: Negative for fever.  HENT: Negative for sore throat.   Respiratory: Positive for cough and shortness of breath.   Cardiovascular:  Negative for chest pain, palpitations and leg swelling.  Gastrointestinal: Negative for nausea, vomiting and diarrhea.  All other systems reviewed and are negative.   Allergies  Review of patient's allergies indicates no known allergies.  Home Medications   Current Outpatient Rx  Name Route Sig Dispense Refill  . ASPIRIN EC 81 MG PO TBEC Oral Take 81 mg by mouth every morning.      Marland Kitchen BUPROPION HCL ER (SR) 100 MG PO TB12 Oral Take 200 mg by mouth daily.      Marland Kitchen CARDIZEM CD 180 MG PO CP24  TAKE 1 CAPSULE BY MOUTH ONCE DAILY. 30 each 6  . CELECOXIB 200 MG PO CAPS Oral Take 200 mg by mouth daily.      Marland Kitchen ESCITALOPRAM OXALATE 20 MG PO TABS Oral Take 1 tablet (20 mg total) by mouth daily. 30 tablet 1  . OMEGA-3 FATTY ACIDS 1000 MG PO CAPS Oral Take 1 g by mouth daily.      Marland Kitchen MAGNESIUM OXIDE 400 MG PO TABS Oral Take 1 tablet (400 mg total) by mouth daily. 30 tablet 6  . METOPROLOL TARTRATE 50 MG PO TABS Oral Take 50 mg by mouth 2 (two) times daily.      Marland Kitchen ONE-DAILY MULTI VITAMINS PO TABS Oral Take 1 tablet by mouth daily.      Marland Kitchen PANTOPRAZOLE SODIUM 40 MG PO TBEC Oral  Take 40 mg by mouth daily.      Marland Kitchen PROPAFENONE HCL 225 MG PO CP12 Oral Take 225 mg by mouth 2 (two) times daily.      Marland Kitchen ROSUVASTATIN CALCIUM 20 MG PO TABS Oral Take 20 mg by mouth daily.      Marland Kitchen SITAGLIPTIN-METFORMIN HCL 50-1000 MG PO TABS Oral Take 1 tablet by mouth 2 (two) times daily with a meal.        BP 107/81  Pulse 63  Temp 97.5 F (36.4 C)  Resp 18  Ht 6' (1.829 m)  Wt 240 lb (108.863 kg)  BMI 32.55 kg/m2  SpO2 95%  BP 125/99  Pulse 114  Temp 97.5 F (36.4 C)  Resp 18  Ht 6' (1.829 m)  Wt 240 lb (108.863 kg)  BMI 32.55 kg/m2  SpO2 96%   Physical Exam CONSTITUTIONAL: Well developed/well nourished HEAD AND FACE: Normocephalic/atraumatic EYES: EOMI/PERRL ENMT: Mucous membranes moist NECK: supple no meningeal signs SPINE:entire spine nontender CV:  no murmurs/rubs/gallops noted Heartbeat irregular    LUNGS:Wheezing bilaterally, no apparent distress ABDOMEN: soft, nontender, no rebound or guarding GU:no cva tenderness NEURO: Pt is awake/alert, moves all extremitiesx4 EXTREMITIES: pulses normal, full ROM SKIN: warm, color normal PSYCH: no abnormalities of mood noted  ED Course  Procedures  DIAGNOSTIC STUDIES: Oxygen Saturation is 95% on room air, adequatel by my interpretation.    COORDINATION OF CARE:  Labs Reviewed  BASIC METABOLIC PANEL  CBC  DIFFERENTIAL  I-STAT TROPONIN I   Dg Chest 2 View  03/27/2011  *RADIOLOGY REPORT*  Clinical Data: Shortness of breath which increases when lying flat, history smoking, hypertension, diabetes  CHEST - 2 VIEW  Comparison: 09/10/2010  Findings: Enlargement of cardiac silhouette. Calcified tortuous aorta. Pulmonary vascular congestion. Emphysematous and minimal bronchitic changes. Accentuation of perihilar interstitial markings, extending into bases, appears chronic. Minimal atelectasis or developing infiltrate left base with blunting of costophrenic angle posteriorly. Remaining lungs free of acute infiltrate. No pneumothorax. No acute osseous findings.  IMPRESSION: Emphysematous and bronchitic changes with chronic accentuation of interstitial markings. Minimal atelectasis or subtle infiltrate at left base with suspect tiny associated left pleural effusion.  Original Report Authenticated By: Lollie Marrow, M.D.     7:36 PM Pt with h/o PAF, last EKG showed sinus rhythm Echo 2012 showed normal EF Will recheck troponin and call cardiology Pt is rate controlled at this time Doubt PE at this time  9:42 PM Pt stable I suspect his symptoms are from afib Not on coumadin D/w dr Terressa Koyanagi, on for cardiology Keep rate control meds the same Start coumadin 5mg  po daily and he will arrange for close f/u this week  11:25 PM Pt with consistent HR above 120 Now feeling more symptomatic Will admit for afib with rvr D/w dr Onalee Hua, will admit to  stepdown and start IV cardizem drip  MDM  Nursing notes reviewed and considered in documentation xrays reviewed and considered All labs/vitals reviewed and considered Previous records reviewed and considered     Date: 03/27/2011  Rate: 89  Rhythm: atrial fibrillation  QRS Axis: normal  Intervals: normal  ST/T Wave abnormalities: nonspecific ST changes  Conduction Disutrbances:none  Narrative Interpretation:   Old EKG Reviewed: changes noted      I personally performed the services described in this documentation, which was scribed in my presence. The recorded information has been reviewed and considered.            Joya Gaskins, MD 03/27/11 [phone]

## 2011-03-27 NOTE — ED Notes (Signed)
C/o difficulty breathing that started last pm. Pt speaking complete sentences this time. resp even/nonlabored.

## 2011-03-27 NOTE — ED Notes (Signed)
Patient given a diet coke per request.

## 2011-03-28 ENCOUNTER — Encounter (HOSPITAL_COMMUNITY): Payer: Self-pay | Admitting: *Deleted

## 2011-03-28 DIAGNOSIS — I4891 Unspecified atrial fibrillation: Principal | ICD-10-CM

## 2011-03-28 DIAGNOSIS — I517 Cardiomegaly: Secondary | ICD-10-CM

## 2011-03-28 LAB — BASIC METABOLIC PANEL
BUN: 13 mg/dL (ref 6–23)
CO2: 29 mEq/L (ref 19–32)
Calcium: 9.8 mg/dL (ref 8.4–10.5)
Chloride: 102 mEq/L (ref 96–112)
Creatinine, Ser: 0.88 mg/dL (ref 0.50–1.35)
GFR calc Af Amer: >90 mL/min (ref >90)
GFR calc non Af Amer: >90 mL/min (ref >90)
Glucose, Bld: 120 mg/dL — ABNORMAL HIGH (ref 70–99)
Potassium: 3.4 mEq/L — ABNORMAL LOW (ref 3.5–5.1)
Sodium: 141 mEq/L (ref 135–145)

## 2011-03-28 LAB — CBC
HCT: 41.4 % (ref 39.0–52.0)
Hemoglobin: 14 g/dL (ref 13.0–17.0)
MCH: 31.8 pg (ref 26.0–34.0)
MCHC: 33.8 g/dL (ref 30.0–36.0)
MCV: 94.1 fL (ref 78.0–100.0)
Platelets: 147 10*3/uL — ABNORMAL LOW (ref 150–400)
RBC: 4.4 MIL/uL (ref 4.22–5.81)
RDW: 13.9 % (ref 11.5–15.5)
WBC: 7.4 10*3/uL (ref 4.0–10.5)

## 2011-03-28 LAB — CARDIAC PANEL(CRET KIN+CKTOT+MB+TROPI)
CK, MB: 2.1 ng/mL (ref 0.3–4.0)
CK, MB: 1.8 ng/mL (ref 0.3–4.0)
Relative Index: INVALID (ref 0.0–2.5)
Relative Index: INVALID (ref 0.0–2.5)
Total CK: 51 U/L (ref 7–232)
Total CK: 53 U/L (ref 7–232)
Troponin I: <0.3 ng/mL (ref <0.30)
Troponin I: <0.3 ng/mL (ref <0.30)

## 2011-03-28 LAB — PROTIME-INR
INR: 1.06 (ref 0.00–1.49)
Prothrombin Time: 14 seconds (ref 11.6–15.2)

## 2011-03-28 LAB — MRSA PCR SCREENING: MRSA by PCR: NEGATIVE

## 2011-03-28 LAB — GLUCOSE, CAPILLARY
Glucose-Capillary: 237 mg/dL — ABNORMAL HIGH (ref 70–99)
Glucose-Capillary: 163 mg/dL — ABNORMAL HIGH (ref 70–99)
Glucose-Capillary: 94 mg/dL (ref 70–99)
Glucose-Capillary: 144 mg/dL — ABNORMAL HIGH (ref 70–99)

## 2011-03-28 LAB — MAGNESIUM: Magnesium: 1.7 mg/dL (ref 1.5–2.5)

## 2011-03-28 LAB — PRO B NATRIURETIC PEPTIDE: Pro B Natriuretic peptide (BNP): 1395 pg/mL — ABNORMAL HIGH (ref 0–125)

## 2011-03-28 LAB — TSH: TSH: 2.533 u[IU]/mL (ref 0.350–4.500)

## 2011-03-28 MED ORDER — CELECOXIB 100 MG PO CAPS
200.0000 mg | ORAL_CAPSULE | Freq: Every day | ORAL | Status: DC
  Administered 2011-03-28 – 2011-03-30 (×3): 200 mg via ORAL
  Filled 2011-03-28 (×2): qty 2
  Filled 2011-03-28 (×2): qty 1
  Filled 2011-03-28: qty 2

## 2011-03-28 MED ORDER — SODIUM CHLORIDE 0.9 % IJ SOLN
3.0000 mL | Freq: Two times a day (BID) | INTRAMUSCULAR | Status: DC
  Administered 2011-03-28 – 2011-03-29 (×2): 3 mL via INTRAVENOUS

## 2011-03-28 MED ORDER — ENOXAPARIN SODIUM 100 MG/ML ~~LOC~~ SOLN
1.0000 mg/kg | Freq: Once | SUBCUTANEOUS | Status: AC
  Administered 2011-03-28: 95 mg via SUBCUTANEOUS
  Filled 2011-03-28: qty 1

## 2011-03-28 MED ORDER — ENOXAPARIN SODIUM 40 MG/0.4ML ~~LOC~~ SOLN
40.0000 mg | Freq: Every day | SUBCUTANEOUS | Status: DC
  Filled 2011-03-28 (×2): qty 0.4

## 2011-03-28 MED ORDER — SODIUM CHLORIDE 0.9 % IJ SOLN
INTRAMUSCULAR | Status: AC
  Administered 2011-03-28: 3 mL via INTRAVENOUS
  Filled 2011-03-28: qty 3

## 2011-03-28 MED ORDER — SODIUM CHLORIDE 0.9 % IJ SOLN
3.0000 mL | INTRAMUSCULAR | Status: DC | PRN
  Filled 2011-03-28: qty 3

## 2011-03-28 MED ORDER — BUPROPION HCL ER (SR) 100 MG PO TB12
200.0000 mg | ORAL_TABLET | Freq: Every day | ORAL | Status: DC
Start: 2011-03-28 — End: 2011-03-30
  Administered 2011-03-28 – 2011-03-30 (×3): 200 mg via ORAL
  Filled 2011-03-28 (×4): qty 2

## 2011-03-28 MED ORDER — PANTOPRAZOLE SODIUM 40 MG PO TBEC
40.0000 mg | DELAYED_RELEASE_TABLET | Freq: Every day | ORAL | Status: DC
  Administered 2011-03-28 – 2011-03-30 (×3): 40 mg via ORAL
  Filled 2011-03-28 (×3): qty 1

## 2011-03-28 MED ORDER — PATIENT'S GUIDE TO USING COUMADIN BOOK
Freq: Once | Status: AC
  Administered 2011-03-28: 17:00:00
  Filled 2011-03-28: qty 1

## 2011-03-28 MED ORDER — INSULIN ASPART 100 UNIT/ML ~~LOC~~ SOLN
0.0000 [IU] | Freq: Three times a day (TID) | SUBCUTANEOUS | Status: DC
  Administered 2011-03-28 – 2011-03-29 (×2): 3 [IU] via SUBCUTANEOUS
  Administered 2011-03-29: 2 [IU] via SUBCUTANEOUS
  Administered 2011-03-29 – 2011-03-30 (×2): 3 [IU] via SUBCUTANEOUS

## 2011-03-28 MED ORDER — ASPIRIN EC 81 MG PO TBEC
81.0000 mg | DELAYED_RELEASE_TABLET | Freq: Every day | ORAL | Status: DC
  Administered 2011-03-28 – 2011-03-30 (×3): 81 mg via ORAL
  Filled 2011-03-28 (×3): qty 1

## 2011-03-28 MED ORDER — METOPROLOL TARTRATE 50 MG PO TABS
50.0000 mg | ORAL_TABLET | Freq: Two times a day (BID) | ORAL | Status: DC
  Administered 2011-03-28 (×3): 50 mg via ORAL
  Filled 2011-03-28 (×3): qty 1

## 2011-03-28 MED ORDER — ENOXAPARIN SODIUM 100 MG/ML ~~LOC~~ SOLN
1.0000 mg/kg | Freq: Two times a day (BID) | SUBCUTANEOUS | Status: DC
  Administered 2011-03-28 – 2011-03-30 (×5): 95 mg via SUBCUTANEOUS
  Filled 2011-03-28 (×4): qty 1

## 2011-03-28 MED ORDER — ESCITALOPRAM OXALATE 10 MG PO TABS
20.0000 mg | ORAL_TABLET | Freq: Every day | ORAL | Status: DC
  Administered 2011-03-28 – 2011-03-30 (×3): 20 mg via ORAL
  Filled 2011-03-28 (×3): qty 2

## 2011-03-28 MED ORDER — ROSUVASTATIN CALCIUM 20 MG PO TABS
20.0000 mg | ORAL_TABLET | Freq: Every day | ORAL | Status: DC
  Administered 2011-03-28 – 2011-03-29 (×2): 20 mg via ORAL
  Filled 2011-03-28 (×2): qty 1

## 2011-03-28 MED ORDER — DILTIAZEM HCL ER COATED BEADS 240 MG PO CP24
240.0000 mg | ORAL_CAPSULE | Freq: Every day | ORAL | Status: DC
  Administered 2011-03-28 – 2011-03-30 (×3): 240 mg via ORAL
  Filled 2011-03-28 (×3): qty 1

## 2011-03-28 MED ORDER — WARFARIN VIDEO
Freq: Once | Status: AC
  Administered 2011-03-28: 12:00:00

## 2011-03-28 MED ORDER — WARFARIN SODIUM 10 MG PO TABS
10.0000 mg | ORAL_TABLET | Freq: Once | ORAL | Status: AC
  Administered 2011-03-28: 10 mg via ORAL
  Filled 2011-03-28: qty 1

## 2011-03-28 MED ORDER — SODIUM CHLORIDE 0.9 % IV SOLN: 250.0000 mL | INTRAVENOUS | Status: DC | PRN

## 2011-03-28 MED ORDER — FUROSEMIDE 10 MG/ML IJ SOLN
40.0000 mg | Freq: Once | INTRAMUSCULAR | Status: AC
  Administered 2011-03-28: 40 mg via INTRAVENOUS
  Filled 2011-03-28: qty 4

## 2011-03-28 MED ORDER — DILTIAZEM HCL 100 MG IV SOLR
5.0000 mg/h | INTRAVENOUS | Status: DC
  Administered 2011-03-28 (×2): 5 mg/h via INTRAVENOUS
  Filled 2011-03-28: qty 100

## 2011-03-28 MED ORDER — PROPAFENONE HCL ER 225 MG PO CP12
225.0000 mg | ORAL_CAPSULE | Freq: Two times a day (BID) | ORAL | Status: DC
  Administered 2011-03-28: 225 mg via ORAL
  Filled 2011-03-28 (×3): qty 1

## 2011-03-28 MED ORDER — MAGNESIUM OXIDE 400 MG PO TABS
400.0000 mg | ORAL_TABLET | Freq: Two times a day (BID) | ORAL | Status: DC
  Administered 2011-03-28 – 2011-03-30 (×4): 400 mg via ORAL
  Filled 2011-03-28 (×4): qty 1

## 2011-03-28 MED ORDER — INSULIN ASPART 100 UNIT/ML ~~LOC~~ SOLN
0.0000 [IU] | Freq: Every day | SUBCUTANEOUS | Status: DC
  Filled 2011-03-28: qty 3

## 2011-03-28 MED ORDER — MAGNESIUM OXIDE 400 MG PO TABS
400.0000 mg | ORAL_TABLET | Freq: Every day | ORAL | Status: DC
  Administered 2011-03-28: 400 mg via ORAL
  Filled 2011-03-28: qty 1

## 2011-03-28 NOTE — Progress Notes (Signed)
Brief followup note in light of 2-D echocardiogram demonstrating evidence of cardiomyopathy with LVEF 25-30%. Could potentially be tachycardia mediated in light of atrial fibrillation and atrial flutter. Would ne consistent with clinical acute on chronic systolic heart failure symptoms, response to diuresis. Need to stop Rythmol SR at this point with CHF and LV dysfunction. CHADS2 score is now 3. For now, continue to recommend Coumadin initiation, heart rate control with preference to beta blocker rather than calcium channel blocker if able, and likely consideration of a eventually using a different antiarrhythmic such as either Tikosyn or perhaps Amiodarone. Would not initiate any new antiarrhythmics now however, until Rythmol washes out.

## 2011-03-28 NOTE — Progress Notes (Signed)
UR Chart Review Completed  

## 2011-03-28 NOTE — Consult Note (Signed)
CARDIOLOGY CONSULT NOTE  Patient ID: [name] Cook MRN: [id number] DOB/AGE: 1951-01-22 60 y.o.  Admit date: 03/27/2011 Referring Physician: [facility] Primary PhysicianProvider Not In System Primary Cardiologist: ROTHBART Reason for Consultation: Atrial fib with CHF Principal Problem:  *Paroxysmal atrial fibrillation Active Problems:  DIABETES MELLITUS, TYPE II  HYPERLIPIDEMIA  DOE (dyspnea on exertion)  HPI: [name] Cook is a 60 year old male patient of Dr.  Bing that we follow in our office for history of paroxysmal atrial fibrillation. He also has a history of hyperlipidemia and type 2 diabetes. [facility] He is disabled secondary to arthritis. He presented to the emergency room after 2 days of orthopnea and PND. The patient states that it he had been having some symptoms for approximately 3 days but became significantly worse the night of admission. On arrival he was found to be in atrial fibrillation with rates between 100-123 beats per minute. BNP level was 1395. CXR showed minimal atelectasis or subtle infiltrate at left base with suspect tiny associated left pleural effusion.he was started on Cardizem drip after a bolus at 5 mg an hour and was also given Lasix 40 mg IV x1. He has subsequently diuresis to 1809 cc of urine that has been recorded. He is breathing better and feeling better. He does remain in atrial fibrillation. We are asked for further recommendations. Of note a echocardiogram has been ordered. He denies chest pain nausea vomiting or edema of the lower extremities. He denies medical noncompliance as this has been an issue in the past causing admissions for atrial fibrillation and CHF.      Review of systems complete and found to be negative unless listed above   Past Medical History  Diagnosis Date  . Palpitation   . Atrial fibrillation     ventricular rate controlled  . Diabetes insipidus   . Hypertension   . Hyperlipidemia     No family  history on file.  History   Social History  . Marital Status: Single    Spouse Name: N/A    Number of Children: N/A  . Years of Education: N/A   Occupational History  . disabled    Social History Main Topics  . Smoking status: Current Everyday Smoker -- 0.5 packs/day for 42 years    Types: Cigarettes  . Smokeless tobacco: Never Used  . Alcohol Use: No  . Drug Use: No  . Sexually Active: Not on file   Other Topics Concern  . Not on file   Social History Narrative  . No narrative on file    Past Surgical History  Procedure Date  . Cardiac catheterization 2001    normal coronaries  . Atrial ablation surgery 2001     Prescriptions prior to admission  Medication Sig Dispense Refill  . aspirin EC 81 MG tablet Take 81 mg by mouth every morning.        Marland Kitchen buPROPion (WELLBUTRIN SR) 100 MG 12 hr tablet Take 200 mg by mouth daily.        Marland Kitchen CARDIZEM CD 180 MG 24 hr capsule TAKE 1 CAPSULE BY MOUTH ONCE DAILY.  30 each  6  . celecoxib (CELEBREX) 200 MG capsule Take 200 mg by mouth daily.        Marland Kitchen escitalopram (LEXAPRO) 20 MG tablet Take 1 tablet (20 mg total) by mouth daily.  30 tablet  1  . fish oil-omega-3 fatty acids 1000 MG capsule Take 1 g by mouth daily.        Marland Kitchen  magnesium oxide (MAG-OX) 400 MG tablet Take 1 tablet (400 mg total) by mouth daily.  30 tablet  6  . metoprolol (LOPRESSOR) 50 MG tablet Take 50 mg by mouth 2 (two) times daily.        . Multiple Vitamin (MULTIVITAMIN) tablet Take 1 tablet by mouth daily.        . pantoprazole (PROTONIX) 40 MG tablet Take 40 mg by mouth daily.        . propafenone (RYTHMOL SR) 225 MG 12 hr capsule Take 225 mg by mouth 2 (two) times daily.        . rosuvastatin (CRESTOR) 20 MG tablet Take 20 mg by mouth daily.        . sitaGLIPtan-metformin (JANUMET) 50-1000 MG per tablet Take 1 tablet by mouth 2 (two) times daily with a meal.          Physical Exam: Blood pressure 127/110, pulse 88, temperature 97.3 F (36.3 C), temperature source  Oral, resp. rate 18, height 6' (1.829 m), weight 212 lb 4.9 oz (96.3 kg), SpO2 95.00%.    General: Well developed, well nourished, in no acute distress Head: Eyes PERRLA, No xanthomas.   Normal cephalic and atramatic  Lungs: Bilateral crackles to the middle lobes without coughing. Heart: Irregular rhythm without MRG.  Pulses are 2+ & equal.            No carotid bruit. No JVD.  No abdominal bruits. No femoral bruits. Abdomen: Bowel sounds are positive, abdomen soft and non-tender without masses or                  Hernia's noted. Msk:  Back normal, normal gait. Normal strength and tone for age. Extremities: No clubbing, cyanosis or edema.  DP +1 Neuro: Alert and oriented X 3. Psych:  Good affect, responds appropriately  Labs:   Lab Results  Component Value Date   WBC 7.4 03/28/2011   HGB 14.0 03/28/2011   HCT 41.4 03/28/2011   MCV 94.1 03/28/2011   PLT 147* 03/28/2011    Lab 03/28/11 0501  NA 141  K 3.4*  CL 102  CO2 29  BUN 13  CREATININE 0.88  CALCIUM 9.8  PROT --  BILITOT --  ALKPHOS --  ALT --  AST --  GLUCOSE 120*   Lab Results  Component Value Date   CKTOTAL 51 03/28/2011   CKMB 2.1 03/28/2011   TROPONINI <0.30 03/28/2011    Lab Results  Component Value Date   CHOL 220 05/05/2008   Lab Results  Component Value Date   HDL 43 05/05/2008   Lab Results  Component Value Date   LDLCALC 119 05/05/2008   No results found for this basename: TRIG   No results found for this basename: CHOLHDL   No results found for this basename: LDLDIRECT   ECHO 04/2010   Study Conclusions    - Left ventricle: The cavity size was normal. Wall thickness was     increased in a pattern of mild LVH. Systolic function was     vigorous. The estimated ejection fraction was in the range of 65%     to 70%. Wall motion was normal; there were no regional wall motion     abnormalities. Doppler parameters are consistent with abnormal     left ventricular relaxation (grade 1 diastolic  dysfunction).   - Mitral valve: Trivial regurgitation.   - Tricuspid valve: Trivial regurgitation.   - Pericardium, extracardiac: A prominent pericardial fat pad was  present - rule out organized, small pericardial effusion.  Radiology: Dg Chest 2 View  03/27/2011  *RADIOLOGY REPORT*  Clinical Data: Shortness of breath which increases when lying flat, history smoking, hypertension, diabetes  CHEST - 2 VIEW  Comparison: 09/10/2010  Findings: Enlargement of cardiac silhouette. Calcified tortuous aorta. Pulmonary vascular congestion. Emphysematous and minimal bronchitic changes. Accentuation of perihilar interstitial markings, extending into bases, appears chronic. Minimal atelectasis or developing infiltrate left base with blunting of costophrenic angle posteriorly. Remaining lungs free of acute infiltrate. No pneumothorax. No acute osseous findings.  IMPRESSION: Emphysematous and bronchitic changes with chronic accentuation of interstitial markings. Minimal atelectasis or subtle infiltrate at left base with suspect tiny associated left pleural effusion.  Original Report Authenticated By: Lollie Marrow, M.D.   EKG: Coarse Atrial fibrillation rate of 89 beats per minute. Admission EKG revealing atrial fibrillation with a rate of 137 beats per minute. There were nonspecific ST abnormalities noted on both EKGs anteriorly.  ASSESSMENT AND PLAN:   1. Atrial fibrillation with RVR: He is currently rate controlled on IV Cardizem drip. He had been on by mouth Cardizem prior to admission at 180 mg (long-acting) and metoprolol 50 mg twice a day and propafenone. He was also on aspirin 81 mg daily. He denies medical noncompliance stating that he is taking his medicine every day, and is able [facility]. May need to consider long-term anticoagulation as he is having recurrent atrial fibrillation that is not rate controlled. May need to consider increasing his dose of Cardizem, Lopressor or  propafenone per Dr McDowell's assessment..  Agree with repeating echo for LV function.  Most recent echo in January of 2012 demonstrated normal LV fx with diastolic dysfunction.  2. CHF: He has diuresed approximately 2000 cc since admission. His breathing status is improved. Cardiac enzymes initially are negative. We'll review echo results for changes in LV function. May need to add outpatient Lasix dose if necessary. More recommendations once all test results are available.  Signed: Bettey Mare. Lyman Bishop NP [facility] 03/28/2011, 10:08 AM Co-Sign MD   Attending note:  Patient seen and examined with Ms. Lawrence whose findings are outlined above. His history includes long-standing paroxysmal atrial fibrillation and atrial flutter, status post previous radiofrequency ablation of atrial flutter in 2001, previous documentation of nonobstructive CAD, noncompliance complicated by lack of insurance although now on Medicaid, type 2 diabetes mellitus, hypertension, and hyperlipidemia. He reports being in his usual state of health until last 2 or 3 days, during which time he has been relatively more short of breath with associated orthopnea, although no palpitations. He was evaluated in the ER and found to be in rapid atrial fibrillation. He reports compliance with all of his medications since being on Medicaid. Otherwise, has had no chest pain. No dizziness or syncope.  ECG shows evidence of atrial flutter, also atrial fibrillation by telemetry. He has been placed on intravenous Cardizem by the primary team, heart rate recently well controlled, at times bradycardic. Troponin I level is normal, proBNP level was 1395, chest x-ray showing bronchitic changes with increased interstitial markings and probable tiny eft pleural effusion.   Current medications include aspirin, Cardizem, DVT dose prophylaxis Lovenox, Lopressor, and Rythmol SR 225 mg twice daily. CHADS2 score is 2.  At this point would  recommend converting Cardizem to all oral dosing, increasing Lovenox dose, continuing current dose of Rythmol SR, proceeding with 2-D echocardiogram to followup cardiac structure and function, and initiate Coumadin in  light of CHADS2 score of 2 as well as possibility that he may also require a DC cardioversion eventually to restore sinus rhythm. Question remains as to whether he should be continued long term on Propafenone perhaps a higher dose, or consider being switched to a different antiarrhythmic, especially now that he has coverage with Medicaid. Referral back to EP as an outpatient can also be considered. We will follow with you.  Jonelle Sidle, M.D., F.A.C.C.

## 2011-03-28 NOTE — ED Notes (Signed)
Patient resting comfortably in bed.  Denies chest pain or shortness of breath at this time. A&O; skin w/d.  Respirations even and unlabored; able to speak in complete sentences without difficulty.

## 2011-03-28 NOTE — Progress Notes (Signed)
Subjective: Breathing feels better, non productive cough, no chest pain.  Objective:  Vital signs in last 24 hours:  Filed Vitals:   03/28/11 0615 03/28/11 0630 03/28/11 0645 03/28/11 0700  BP: 116/82 117/71 117/76 118/87  Pulse: 127 62 55 68  Temp:    97.3 F (36.3 C)  TempSrc:    Oral  Resp:      Height:      Weight:      SpO2: 95% 97% 95% 96%    Intake/Output from previous day:   Intake/Output Summary (Last 24 hours) at 03/28/11 0905 Last data filed at 03/28/11 0700  Gross per 24 hour  Intake 540.91 ml  Output   2700 ml  Net -2159.09 ml    Physical Exam: General: Alert, awake, oriented x3, in no acute distress. HEENT: No bruits, no goiter. Moist mucous membranes, no scleral icterus, no conjunctival pallor. Heart: irregular Lungs: Clear to auscultation bilaterally. No wheezing, no rhonchi, no rales.  Abdomen: Soft, nontender, nondistended, positive bowel sounds. Extremities: No clubbing cyanosis or edema,  positive pedal pulses. Neuro: Grossly intact, nonfocal.    Lab Results:  Basic Metabolic Panel:    Component Value Date/Time   NA 141 03/28/2011 0501   K 3.4* 03/28/2011 0501   CL 102 03/28/2011 0501   CO2 29 03/28/2011 0501   BUN 13 03/28/2011 0501   CREATININE 0.88 03/28/2011 0501   CREATININE 0.84 11/03/2010 0850   GLUCOSE 120* 03/28/2011 0501   CALCIUM 9.8 03/28/2011 0501   CBC:    Component Value Date/Time   WBC 7.4 03/28/2011 0501   HGB 14.0 03/28/2011 0501   HCT 41.4 03/28/2011 0501   PLT 147* 03/28/2011 0501   MCV 94.1 03/28/2011 0501   NEUTROABS 5.1 03/27/2011 1850   LYMPHSABS 2.8 03/27/2011 1850   MONOABS 0.8 03/27/2011 1850   EOSABS 0.3 03/27/2011 1850   BASOSABS 0.1 03/27/2011 1850      Lab 03/28/11 0501 03/27/11 1850  WBC 7.4 9.0  HGB 14.0 13.8  HCT 41.4 40.6  PLT 147* 147*  MCV 94.1 93.8  MCH 31.8 31.9  MCHC 33.8 34.0  RDW 13.9 13.8  LYMPHSABS -- 2.8  MONOABS -- 0.8  EOSABS -- 0.3  BASOSABS -- 0.1  BANDABS -- --      Lab 03/28/11 0502 03/28/11 0501 03/27/11 1850  NA -- 141 138  K -- 3.4* 3.8  CL -- 102 102  CO2 -- 29 25  GLUCOSE -- 120* 87  BUN -- 13 13  CREATININE -- 0.88 0.91  CALCIUM -- 9.8 9.9  MG 1.7 -- --    Lab 03/28/11 0502  INR 1.06  PROTIME --   Cardiac markers:  Lab 03/28/11 0500  CKMB 2.1  TROPONINI <0.30  MYOGLOBIN --   No components found with this basename: POCBNP:3 Recent Results (from the past 240 hour(s))  MRSA PCR SCREENING     Status: Normal   Collection Time   03/28/11  1:38 AM      Component Value Range Status Comment   MRSA by PCR NEGATIVE  NEGATIVE  Final     Studies/Results: Dg Chest 2 View  03/27/2011  *RADIOLOGY REPORT*  Clinical Data: Shortness of breath which increases when lying flat, history smoking, hypertension, diabetes  CHEST - 2 VIEW  Comparison: 09/10/2010  Findings: Enlargement of cardiac silhouette. Calcified tortuous aorta. Pulmonary vascular congestion. Emphysematous and minimal bronchitic changes. Accentuation of perihilar interstitial markings, extending into bases, appears chronic. Minimal atelectasis or developing infiltrate left  base with blunting of costophrenic angle posteriorly. Remaining lungs free of acute infiltrate. No pneumothorax. No acute osseous findings.  IMPRESSION: Emphysematous and bronchitic changes with chronic accentuation of interstitial markings. Minimal atelectasis or subtle infiltrate at left base with suspect tiny associated left pleural effusion.  Original Report Authenticated By: Lollie Marrow, M.D.    Medications: Scheduled Meds:   . aspirin EC  81 mg Oral Daily  . buPROPion  200 mg Oral Daily  . celecoxib  200 mg Oral Daily  . diltiazem  240 mg Oral Daily  . enoxaparin  40 mg Subcutaneous Daily  . escitalopram  20 mg Oral Daily  . furosemide  40 mg Intravenous Once  . magnesium oxide  400 mg Oral Daily  . metoprolol  50 mg Oral BID  . pantoprazole  40 mg Oral Daily  . propafenone  225 mg Oral BID  .  rosuvastatin  20 mg Oral Daily  . sodium chloride  3 mL Intravenous Q12H   Continuous Infusions:   . diltiazem (CARDIZEM) infusion 5 mg/hr (03/28/11 0737)  . DISCONTD: diltiazem (CARDIZEM) infusion 5 mg/hr (03/27/11 2324)   PRN Meds:.sodium chloride, sodium chloride  Assessment/Plan:  Principal Problem:  *Paroxysmal atrial fibrillation Active Problems:  DIABETES MELLITUS, TYPE II  HYPERLIPIDEMIA  DOE (dyspnea on exertion)  Plan:  Patient's heart rate is still very labile ranging from 60s to 120s on cardizem gtt.  Patient is also on propafenone.  Will ask cardiology assistance in helping manage heart rate. His shortness of breath has improved He did receive one dose of IV lasix, also found to have elevated BNP 2D echo has been orderd Follow blood sugars and add sliding scale insulin  Dispo.  Stay in SDU for now since he is still on cardizem gtt   LOS: 1 day   MEMON,JEHANZEB 03/28/2011, 9:05 AM

## 2011-03-28 NOTE — Progress Notes (Signed)
ANTICOAGULATION CONSULT NOTE - Initial Consult  Pharmacy Consult for Enoxaparin and Warfarin Indication:  Afib  No Known Allergies  Patient Measurements: Height: 6' (182.9 cm) Weight: 212 lb 4.9 oz (96.3 kg) IBW/kg (Calculated) : 77.6  Adjusted Body Weight:   Vital Signs: Temp: 97.3 F (36.3 C) (12/11 0700) Temp src: Oral (12/11 0700) BP: 127/110 mmHg (12/11 0900) Pulse Rate: 88  (12/11 0900)  Labs:  Basename 03/28/11 0502 03/28/11 0501 03/28/11 0500 03/27/11 1850  HGB -- 14.0 -- 13.8  HCT -- 41.4 -- 40.6  PLT -- 147* -- 147*  APTT -- -- -- --  LABPROT 14.0 -- -- --  INR 1.06 -- -- --  HEPARINUNFRC -- -- -- --  CREATININE -- 0.88 -- 0.91  CKTOTAL -- -- 51 --  CKMB -- -- 2.1 --  TROPONINI -- -- <0.30 --   Estimated Creatinine Clearance: 110.1 ml/min (by C-G formula based on Cr of 0.88).  Medical History: Past Medical History  Diagnosis Date  . Palpitation   . Atrial fibrillation     ventricular rate controlled  . Diabetes insipidus   . Hypertension   . Hyperlipidemia     Medications:  Scheduled:    . aspirin EC  81 mg Oral Daily  . buPROPion  200 mg Oral Daily  . celecoxib  200 mg Oral Daily  . diltiazem  240 mg Oral Daily  . enoxaparin (LOVENOX) injection  1 mg/kg Subcutaneous Once  . enoxaparin (LOVENOX) injection  1 mg/kg Subcutaneous Q12H  . escitalopram  20 mg Oral Daily  . furosemide  40 mg Intravenous Once  . insulin aspart  0-15 Units Subcutaneous TID WC  . insulin aspart  0-5 Units Subcutaneous QHS  . magnesium oxide  400 mg Oral BID  . metoprolol  50 mg Oral BID  . pantoprazole  40 mg Oral Daily  . patient's guide to using coumadin book   Does not apply Once  . propafenone  225 mg Oral BID  . rosuvastatin  20 mg Oral Daily  . sodium chloride  3 mL Intravenous Q12H  . warfarin  10 mg Oral ONCE-1800  . warfarin   Does not apply Once  . DISCONTD: enoxaparin  40 mg Subcutaneous Daily  . DISCONTD: magnesium oxide  400 mg Oral Daily     Assessment: Bridge therapy for Afib with full dose Enoxaparin and Warfarin.  Goal of Therapy:  INR 2-3   Plan:  Enoxaparin 1 mg per kg sub-cutaneously every 12 hours. CBC daily. INR daily. Warfarin 10mg  today.  [name] Cook 03/28/2011,11:06 AM

## 2011-03-28 NOTE — Progress Notes (Signed)
*  PRELIMINARY RESULTS* Echocardiogram 2D Echocardiogram has been performed.  [name] Cook 03/28/2011, 4:25 PM

## 2011-03-28 NOTE — H&P (Signed)
Chief Complaint:  Shortness of breath worse with exertion  HPI: 60 year old male with a history of proximal A. fib who presents to the emergency department with 2 to three-day history of worsening dyspnea on exertion. He says he has been having worsening PND orthopnea over the last several nights and this has occurred to him before when his rate was uncontrolled like it is now. He denies any chest pain or shortness of breath at rest. He denies any lower shin edema, fevers or cough. There has been no nausea vomiting or diarrhea and no other recent illnesses. He does not have a history of heart failure. He had a heart catheterization 2002 which showed nonobstructive coronary artery disease.   Review of Systems:  Otherwise negative  Past Medical History: Past Medical History  Diagnosis Date  . Palpitation   . Atrial fibrillation     ventricular rate controlled  . Diabetes insipidus   . Hypertension   . Hyperlipidemia    Past Surgical History  Procedure Date  . Cardiac catheterization 2001    normal coronaries  . Atrial ablation surgery 2001    Medications: Prior to Admission medications   Medication Sig Start Date End Date Taking? Authorizing Provider  aspirin EC 81 MG tablet Take 81 mg by mouth every morning.     Yes Historical Provider, MD  buPROPion (WELLBUTRIN SR) 100 MG 12 hr tablet Take 200 mg by mouth daily.     Yes Historical Provider, MD  CARDIZEM CD 180 MG 24 hr capsule TAKE 1 CAPSULE BY MOUTH ONCE DAILY. 03/27/11  Yes [name]. Rothbart, MD  celecoxib (CELEBREX) 200 MG capsule Take 200 mg by mouth daily.     Yes Historical Provider, MD  escitalopram (LEXAPRO) 20 MG tablet Take 1 tablet (20 mg total) by mouth daily. 10/27/10  Yes [name]. Rothbart, MD  fish oil-omega-3 fatty acids 1000 MG capsule Take 1 g by mouth daily.     Yes Historical Provider, MD  magnesium oxide (MAG-OX) 400 MG tablet Take 1 tablet (400 mg total) by mouth daily. 11/09/10  Yes Joni Reining, NP    metoprolol (LOPRESSOR) 50 MG tablet Take 50 mg by mouth 2 (two) times daily.     Yes Historical Provider, MD  Multiple Vitamin (MULTIVITAMIN) tablet Take 1 tablet by mouth daily.     Yes Historical Provider, MD  pantoprazole (PROTONIX) 40 MG tablet Take 40 mg by mouth daily.     Yes Historical Provider, MD  propafenone (RYTHMOL SR) 225 MG 12 hr capsule Take 225 mg by mouth 2 (two) times daily.     Yes Historical Provider, MD  rosuvastatin (CRESTOR) 20 MG tablet Take 20 mg by mouth daily.     Yes Historical Provider, MD  sitaGLIPtan-metformin (JANUMET) 50-1000 MG per tablet Take 1 tablet by mouth 2 (two) times daily with a meal.     Yes Historical Provider, MD    Allergies:  No Known Allergies  Social History:  reports that he has been smoking Cigarettes.  He has a 21 pack-year smoking history. He has never used smokeless tobacco. He reports that he does not drink alcohol or use illicit drugs.  Family History: No family history on file.  Physical Exam: Filed Vitals:   03/27/11 1649 03/27/11 1828 03/27/11 2258  BP: 107/81 118/85 125/99  Pulse: 63  114  Temp: 97.5 F (36.4 C)    Resp: 18 16 18   Height: 6' (1.829 m)    Weight: 108.863 kg (240 lb)  SpO2: 95%  96%   General appearance: alert, cooperative and no distress Resp: rales bibasilar Cardio: irregularly irregular rhythm GI: soft, non-tender; bowel sounds normal; no masses,  no organomegaly Extremities: extremities normal, atraumatic, no cyanosis or edema Pulses: 2+ and symmetric Skin: Skin color, texture, turgor normal. No rashes or lesions Neurologic: Grossly normal   Labs on Admission:   [facility] 03/27/11 1850  NA 138  K 3.8  CL 102  CO2 25  GLUCOSE 87  BUN 13  CREATININE 0.91  CALCIUM 9.9  MG --  PHOS --    Basename 03/27/11 1850  WBC 9.0  NEUTROABS 5.1  HGB 13.8  HCT 40.6  MCV 93.8  PLT 147*    Radiological Exams on Admission: Dg Chest 2 View  03/27/2011  *RADIOLOGY REPORT*  Clinical Data:  Shortness of breath which increases when lying flat, history smoking, hypertension, diabetes  CHEST - 2 VIEW  Comparison: 09/10/2010  Findings: Enlargement of cardiac silhouette. Calcified tortuous aorta. Pulmonary vascular congestion. Emphysematous and minimal bronchitic changes. Accentuation of perihilar interstitial markings, extending into bases, appears chronic. Minimal atelectasis or developing infiltrate left base with blunting of costophrenic angle posteriorly. Remaining lungs free of acute infiltrate. No pneumothorax. No acute osseous findings.  IMPRESSION: Emphysematous and bronchitic changes with chronic accentuation of interstitial markings. Minimal atelectasis or subtle infiltrate at left base with suspect tiny associated left pleural effusion.  Original Report Authenticated By: Lollie Marrow, M.D.    Assessment/Plan Present on Admission:  60 year old male with A. fib with RVR and dyspnea on exertion  .Paroxysmal atrial fibrillation patient has been put on diltiazem drip in the emergency department and his rate is already around 100. We'll serial cardiac enzymes check a 2-D echo place him in and step down also increases Cardizem orally and titrate him off the drip.  .DOE (dyspnea on exertion) question whether he has a component of early congestive heart failure syndrome for a pro BNP and obtain 2-D echo and going to give him a dose of Lasix secondary to his physical exam.  .DIABETES MELLITUS, TYPE II .HYPERLIPIDEMIA  Mate Alegria A 03/28/2011, 12:14 AM Patient seen before midnight

## 2011-03-29 DIAGNOSIS — I4891 Unspecified atrial fibrillation: Secondary | ICD-10-CM

## 2011-03-29 LAB — BASIC METABOLIC PANEL
BUN: 12 mg/dL (ref 6–23)
CO2: 27 mEq/L (ref 19–32)
Calcium: 9.3 mg/dL (ref 8.4–10.5)
Chloride: 104 mEq/L (ref 96–112)
Creatinine, Ser: 0.83 mg/dL (ref 0.50–1.35)
GFR calc Af Amer: >90 mL/min (ref >90)
GFR calc non Af Amer: >90 mL/min (ref >90)
Glucose, Bld: 117 mg/dL — ABNORMAL HIGH (ref 70–99)
Potassium: 3.7 mEq/L (ref 3.5–5.1)
Sodium: 141 mEq/L (ref 135–145)

## 2011-03-29 LAB — HEMOGLOBIN A1C
Hgb A1c MFr Bld: 6.7 % — ABNORMAL HIGH (ref <5.7)
Mean Plasma Glucose: 146 mg/dL — ABNORMAL HIGH (ref <117)

## 2011-03-29 LAB — GLUCOSE, CAPILLARY
Glucose-Capillary: 157 mg/dL — ABNORMAL HIGH (ref 70–99)
Glucose-Capillary: 166 mg/dL — ABNORMAL HIGH (ref 70–99)
Glucose-Capillary: 138 mg/dL — ABNORMAL HIGH (ref 70–99)
Glucose-Capillary: 143 mg/dL — ABNORMAL HIGH (ref 70–99)

## 2011-03-29 LAB — CARDIAC PANEL(CRET KIN+CKTOT+MB+TROPI)
CK, MB: 1.9 ng/mL (ref 0.3–4.0)
Relative Index: INVALID (ref 0.0–2.5)
Total CK: 54 U/L (ref 7–232)
Troponin I: <0.3 ng/mL (ref <0.30)

## 2011-03-29 LAB — CBC
HCT: 41.7 % (ref 39.0–52.0)
Hemoglobin: 14.2 g/dL (ref 13.0–17.0)
MCH: 32 pg (ref 26.0–34.0)
MCHC: 34.1 g/dL (ref 30.0–36.0)
MCV: 93.9 fL (ref 78.0–100.0)
Platelets: 138 10*3/uL — ABNORMAL LOW (ref 150–400)
RBC: 4.44 MIL/uL (ref 4.22–5.81)
RDW: 13.8 % (ref 11.5–15.5)
WBC: 7.3 10*3/uL (ref 4.0–10.5)

## 2011-03-29 LAB — PROTIME-INR
INR: 1.14 (ref 0.00–1.49)
Prothrombin Time: 14.8 seconds (ref 11.6–15.2)

## 2011-03-29 MED ORDER — DIGOXIN 0.25 MG/ML IJ SOLN: 0.2500 mg | Freq: Four times a day (QID) | INTRAMUSCULAR | Status: DC

## 2011-03-29 MED ORDER — SODIUM CHLORIDE 0.9 % IJ SOLN
INTRAMUSCULAR | Status: AC
  Administered 2011-03-29: 3 mL via INTRAVENOUS
  Filled 2011-03-29: qty 3

## 2011-03-29 MED ORDER — WARFARIN SODIUM 7.5 MG PO TABS
7.5000 mg | ORAL_TABLET | Freq: Once | ORAL | Status: AC
  Administered 2011-03-29: 7.5 mg via ORAL
  Filled 2011-03-29: qty 1

## 2011-03-29 MED ORDER — DIGOXIN 125 MCG PO TABS: 0.1250 mg | ORAL_TABLET | Freq: Every day | ORAL | Status: DC

## 2011-03-29 MED ORDER — LISINOPRIL 10 MG PO TABS
5.0000 mg | ORAL_TABLET | Freq: Every day | ORAL | Status: DC
  Administered 2011-03-29 – 2011-03-30 (×2): 5 mg via ORAL
  Filled 2011-03-29 (×2): qty 1

## 2011-03-29 MED ORDER — SPIRONOLACTONE 25 MG PO TABS
25.0000 mg | ORAL_TABLET | Freq: Every day | ORAL | Status: DC
  Administered 2011-03-29 – 2011-03-30 (×2): 25 mg via ORAL
  Filled 2011-03-29 (×2): qty 1

## 2011-03-29 MED ORDER — DIGOXIN 0.25 MG/ML IJ SOLN: 0.5000 mg | Freq: Once | INTRAMUSCULAR | Status: DC

## 2011-03-29 MED ORDER — METOPROLOL TARTRATE 25 MG PO TABS
75.0000 mg | ORAL_TABLET | Freq: Two times a day (BID) | ORAL | Status: DC
  Administered 2011-03-29 (×2): 75 mg via ORAL
  Filled 2011-03-29: qty 3
  Filled 2011-03-29: qty 1

## 2011-03-29 NOTE — Progress Notes (Signed)
Subjective: This man is feeling still somewhat lethargic but he denies dyspnea, palpitations or chest pain. Serial cardiac enzymes did not indicate myocardial ischemia or infarction. 2-D echocardiogram showed cardiomyopathy with infection fraction of 25-30%. He has multiple risk factors for coronary artery disease including tobacco abuse, hypertension, hyperlipidemia and diabetes mellitus. He does not have any significant family history of coronary artery disease.           Physical Exam: Blood pressure 124/87, pulse 87, temperature 97.5 F (36.4 C), temperature source Oral, resp. rate 15, height 6' (1.829 m), weight 96.3 kg (212 lb 4.9 oz), SpO2 95.00%. He looks systemically well. He is comfortable at rest. Heart sounds are present and in atrial fibrillation with ventricular rate in the 110s to 120s. Jugular venous pressure not raised. Lung fields are clear with occasionally scattered wheezing which is typical of his chronic lung status suspect with long history of tobacco abuse. He is alert and orientated.   Investigations:  Recent Results (from the past 240 hour(s))  MRSA PCR SCREENING     Status: Normal   Collection Time   03/28/11  1:38 AM      Component Value Range Status Comment   MRSA by PCR NEGATIVE  NEGATIVE  Final      Basic Metabolic Panel:  Basename 03/29/11 0436 03/28/11 0502 03/28/11 0501  NA 141 -- 141  K 3.7 -- 3.4*  CL 104 -- 102  CO2 27 -- 29  GLUCOSE 117* -- 120*  BUN 12 -- 13  CREATININE 0.83 -- 0.88  CALCIUM 9.3 -- 9.8  MG -- 1.7 --  PHOS -- -- --      CBC:  Basename 03/29/11 0436 03/28/11 0501 03/27/11 1850  WBC 7.3 7.4 --  NEUTROABS -- -- 5.1  HGB 14.2 14.0 --  HCT 41.7 41.4 --  MCV 93.9 94.1 --  PLT 138* 147* --    Dg Chest 2 View  03/27/2011  *RADIOLOGY REPORT*  Clinical Data: Shortness of breath which increases when lying flat, history smoking, hypertension, diabetes  CHEST - 2 VIEW  Comparison: 09/10/2010  Findings: Enlargement of  cardiac silhouette. Calcified tortuous aorta. Pulmonary vascular congestion. Emphysematous and minimal bronchitic changes. Accentuation of perihilar interstitial markings, extending into bases, appears chronic. Minimal atelectasis or developing infiltrate left base with blunting of costophrenic angle posteriorly. Remaining lungs free of acute infiltrate. No pneumothorax. No acute osseous findings.  IMPRESSION: Emphysematous and bronchitic changes with chronic accentuation of interstitial markings. Minimal atelectasis or subtle infiltrate at left base with suspect tiny associated left pleural effusion.  Original Report Authenticated By: Lollie Marrow, M.D.      Medications: I have reviewed the patient's current medications.  Impression:  1. Atrophic fibrillation with rapid ventricular response, 2. Cardiomyopathy, ejection fraction 25-30%, clinically not in heart failure. 3. Diabetes mellitus. 4. Hypertension. 5. Hyperlipidemia. 6. Tobacco abuse, ongoing.     Plan: 1. Continue with current medications. 2. This patient, in my opinion, warrants cardiac catheterization. His last cardiac catheterization was in 2002 which apparently showed normal coronary arteries. He  has multiple risk factors for coronary artery disease and I would be extremely surprised if he did not have coronary artery disease. 3. Move to telemetry.     LOS: 2 days   Wilson Singer Pager [phone]  03/29/2011, 8:21 AM

## 2011-03-29 NOTE — Progress Notes (Signed)
ANTICOAGULATION CONSULT NOTE   Pharmacy Consult for Enoxaparin and Warfarin Indication:  Afib  No Known Allergies  Patient Measurements: Height: 6' (182.9 cm) Weight: 212 lb 4.9 oz (96.3 kg) IBW/kg (Calculated) : 77.6  Adjusted Body Weight:   Vital Signs: Temp: 97.5 F (36.4 C) (12/12 0400) Temp src: Oral (12/12 0400) BP: 124/87 mmHg (12/12 0600) Pulse Rate: 87  (12/12 0600)  Labs:  Basename 03/29/11 0436 03/28/11 2338 03/28/11 1604 03/28/11 0502 03/28/11 0501 03/28/11 0500 03/27/11 1850  HGB 14.2 -- -- -- 14.0 -- --  HCT 41.7 -- -- -- 41.4 -- 40.6  PLT 138* -- -- -- 147* -- 147*  APTT -- -- -- -- -- -- --  LABPROT 14.8 -- -- 14.0 -- -- --  INR 1.14 -- -- 1.06 -- -- --  HEPARINUNFRC -- -- -- -- -- -- --  CREATININE 0.83 -- -- -- 0.88 -- 0.91  CKTOTAL -- 54 53 -- -- 51 --  CKMB -- 1.9 1.8 -- -- 2.1 --  TROPONINI -- <0.30 <0.30 -- -- <0.30 --   Estimated Creatinine Clearance: 116.8 ml/min (by C-G formula based on Cr of 0.83).  Medical History: Past Medical History  Diagnosis Date  . Palpitation   . Atrial fibrillation     ventricular rate controlled  . Diabetes insipidus   . Hypertension   . Hyperlipidemia    Medications:  Scheduled:     . aspirin EC  81 mg Oral Daily  . buPROPion  200 mg Oral Daily  . celecoxib  200 mg Oral Daily  . diltiazem  240 mg Oral Daily  . enoxaparin (LOVENOX) injection  1 mg/kg Subcutaneous Once  . enoxaparin (LOVENOX) injection  1 mg/kg Subcutaneous Q12H  . escitalopram  20 mg Oral Daily  . insulin aspart  0-15 Units Subcutaneous TID WC  . insulin aspart  0-5 Units Subcutaneous QHS  . magnesium oxide  400 mg Oral BID  . metoprolol  50 mg Oral BID  . pantoprazole  40 mg Oral Daily  . patient's guide to using coumadin book   Does not apply Once  . rosuvastatin  20 mg Oral Daily  . sodium chloride  3 mL Intravenous Q12H  . warfarin  10 mg Oral ONCE-1800  . warfarin  7.5 mg Oral ONCE-1800  . warfarin   Does not apply Once  .  DISCONTD: enoxaparin  40 mg Subcutaneous Daily  . DISCONTD: magnesium oxide  400 mg Oral Daily  . DISCONTD: propafenone  225 mg Oral BID   Assessment: Bridge therapy for Afib with full dose Enoxaparin and Warfarin.  Goal of Therapy:  INR 2-3   Plan:  Enoxaparin 1 mg per kg sub-cutaneously every 12 hours. CBC daily. INR daily. Warfarin 7.5mg  today  Valrie Hart A 03/29/2011,8:20 AM

## 2011-03-29 NOTE — Progress Notes (Addendum)
SUBJECTIVE:I am feeling better. No complaints of pain.  Principal Problem:  *Paroxysmal atrial fibrillation Active Problems:  DIABETES MELLITUS, TYPE II  HYPERLIPIDEMIA  DOE (dyspnea on exertion)   LABS: Basic Metabolic Panel:  Basename 03/29/11 0436 03/28/11 0502 03/28/11 0501  NA 141 -- 141  K 3.7 -- 3.4*  CL 104 -- 102  CO2 27 -- 29  GLUCOSE 117* -- 120*  BUN 12 -- 13  CREATININE 0.83 -- 0.88  CALCIUM 9.3 -- 9.8  MG -- 1.7 --  PHOS -- -- --   Liver Function Tests: No results found for this basename: AST:2,ALT:2,ALKPHOS:2,BILITOT:2,PROT:2,ALBUMIN:2 in the last 72 hours No results found for this basename: LIPASE:2,AMYLASE:2 in the last 72 hours CBC:  Basename 03/29/11 0436 03/28/11 0501 03/27/11 1850  WBC 7.3 7.4 --  NEUTROABS -- -- 5.1  HGB 14.2 14.0 --  HCT 41.7 41.4 --  MCV 93.9 94.1 --  PLT 138* 147* --   Cardiac Enzymes:  Basename 03/28/11 2338 03/28/11 1604 03/28/11 0500  CKTOTAL 54 53 51  CKMB 1.9 1.8 2.1  CKMBINDEX -- -- --  TROPONINI <0.30 <0.30 <0.30    Basename 03/28/11 0502  TSH 2.533  T4TOTAL --  T3FREE --  THYROIDAB --   ECHO 03/28/11 Left ventricle: The cavity size was at the upper limits of normal. There was mild focal basal hypertrophy of the septum. Systolic function was severely reduced. The estimated ejection fraction was in the range of 25% to 30%. Diffuse hypokinesis. The study is not technically sufficient to allow evaluation of LV diastolic function. - Aortic root: The aortic root was mildly dilated. - Mitral valve: Trivial regurgitation. - Left atrium: The atrium was mildly dilated. - Right ventricle: Systolic function was mildly reduced. - Tricuspid valve: Trivial regurgitation. - Inferior vena cava: The vessel was dilated; the respirophasic diameter changes were blunted (< 50%); findings are consistent with elevated central venous pressure. - Pericardium, extracardiac: There was no  pericardial effusion.  RADIOLOGY: Dg Chest 2 View  03/27/2011  *RADIOLOGY REPORT*  Clinical Data: Shortness of breath which increases when lying flat, history smoking, hypertension, diabetes  CHEST - 2 VIEW  Comparison: 09/10/2010    IMPRESSION: Emphysematous and bronchitic changes with chronic accentuation of interstitial markings. Minimal atelectasis or subtle infiltrate at left base with suspect tiny associated left pleural effusion.  Original Report Authenticated By: Lollie Marrow, M.D.   PHYSICAL EXAM BP 124/87  Pulse 87  Temp(Src) 97.5 F (36.4 C) (Oral)  Resp 15  Ht 6' (1.829 m)  Wt 212 lb 4.9 oz (96.3 kg)  BMI 28.79 kg/m2  SpO2 95% General: Well developed, well nourished, in no acute distress Head: Eyes PERRLA, No xanthomas.   Normal cephalic and atramatic  Lungs: Clear bilaterally to auscultation and percussion. Heart: Irregular rhythm, Tachycardic No MRG .  Pulses are 2+ & equal.            No carotid bruit. No JVD.  No abdominal bruits. No femoral bruits. Abdomen: Bowel sounds are positive, abdomen soft and non-tender without masses or   Hernia's noted. Msk:  Back normal, normal gait. Normal strength and tone for age. Extremities: No clubbing, cyanosis or edema.  DP +1 Neuro: Alert and oriented X 3. Psych:  Good affect, responds appropriately  TELEMETRY: Reviewed telemetry pt in : Atrial Fib Rate of 110-115 bpm.  ASSESSMENT AND PLAN:  1. Atrial fibrillation with RVR: He is now on po Cardizem 240 mg daily and metoprolol 50 mg BID but rate is  not well controlled. Rythmol stopped secondary to systolic dysfunction per echo this admission, with EF of 25%. His history includes long-standing paroxysmal atrial fibrillation and atrial flutter, status post previous radiofrequency ablation of atrial flutter in 2001. CHADs Score is 3 (Hypertenstion, DM and CHF)  Will add digoxin to medication regimen, with IV loading. He as been started on coumadin yesterday.   2. CHF: Echo this  admission demonstrates significant systolic dysfunction with EF of 25-30% which is a big change from documented echo in January of 2012 at normal EF. Will add spironolactone to medication regimen and continue Lasix at 40 mg daily. He has diuresed very well since admission with inconsistent weight loss per recordings.   3.  CAD: Per discharge summary in 2001 cardiac cath demonstrated normal coronary arteries with the exception of 50% intermedius ramus and normal left ventricular function. With significant decrease in LVEF to 25%-30%, recommendation for cardiac cath in the setting of multiple CVRF would prove beneficial for evaluation of progression of disease. Would do this prior to therapeutic INR as he has just been started on coumadin yesterday. Will discuss this with Dr. Daleen Squibb who is rounding today. Ok to move to telemetry.  Bettey Mare. Lawrence NP [facility] 03/29/2011, 8:15 AM  After discussion with Dr. Daleen Squibb, it is his recommendation not to given digoxin at this time and to increase metoprolol dose instead. Concerning cardiac catheterization, he recommends that this be delayed as he has diffuse hypokinesis. Low EF may be from tachycardic mediated CM with afib RVR. Please see his note attached.  Joni Reining NP Patient examined and agree except changes made.  Valera Castle, MD 03/29/2011 1:13 PM  I have taken a history, reviewed medications, allergies, PMH, SH, FH, and reviewed ROS and examined the patient.  I agree with the assessment and plan. 1:14 PM Marilee Ditommaso C. Daleen Squibb, MD, [facility] Pager:  [phone]   Valera Castle, MD 03/29/2011 1:13 PM

## 2011-03-30 LAB — COMPREHENSIVE METABOLIC PANEL
ALT: 18 U/L (ref 0–53)
AST: 16 U/L (ref 0–37)
Albumin: 3.6 g/dL (ref 3.5–5.2)
Alkaline Phosphatase: 72 U/L (ref 39–117)
BUN: 12 mg/dL (ref 6–23)
CO2: 29 mEq/L (ref 19–32)
Calcium: 9.5 mg/dL (ref 8.4–10.5)
Chloride: 105 mEq/L (ref 96–112)
Creatinine, Ser: 0.83 mg/dL (ref 0.50–1.35)
GFR calc Af Amer: >90 mL/min (ref >90)
GFR calc non Af Amer: >90 mL/min (ref >90)
Glucose, Bld: 126 mg/dL — ABNORMAL HIGH (ref 70–99)
Potassium: 3.9 mEq/L (ref 3.5–5.1)
Sodium: 140 mEq/L (ref 135–145)
Total Bilirubin: 0.3 mg/dL (ref 0.3–1.2)
Total Protein: 6.2 g/dL (ref 6.0–8.3)

## 2011-03-30 LAB — CBC
HCT: 39.6 % (ref 39.0–52.0)
Hemoglobin: 13.3 g/dL (ref 13.0–17.0)
MCH: 31.4 pg (ref 26.0–34.0)
MCHC: 33.6 g/dL (ref 30.0–36.0)
MCV: 93.4 fL (ref 78.0–100.0)
Platelets: 138 10*3/uL — ABNORMAL LOW (ref 150–400)
RBC: 4.24 MIL/uL (ref 4.22–5.81)
RDW: 13.8 % (ref 11.5–15.5)
WBC: 6.4 10*3/uL (ref 4.0–10.5)

## 2011-03-30 LAB — GLUCOSE, CAPILLARY: Glucose-Capillary: 167 mg/dL — ABNORMAL HIGH (ref 70–99)

## 2011-03-30 LAB — PROTIME-INR
INR: 2.24 — ABNORMAL HIGH (ref 0.00–1.49)
Prothrombin Time: 25.2 seconds — ABNORMAL HIGH (ref 11.6–15.2)

## 2011-03-30 LAB — LIPID PANEL
Cholesterol: 122 mg/dL (ref 0–200)
HDL: 42 mg/dL (ref >39)
LDL Cholesterol: 56 mg/dL (ref 0–99)
Total CHOL/HDL Ratio: 2.9 RATIO
Triglycerides: 120 mg/dL (ref <150)
VLDL: 24 mg/dL (ref 0–40)

## 2011-03-30 MED ORDER — WARFARIN SODIUM 7.5 MG PO TABS: 7.5000 mg | ORAL_TABLET | Freq: Every day | ORAL | Status: DC

## 2011-03-30 MED ORDER — METOPROLOL TARTRATE 50 MG PO TABS: 100.0000 mg | ORAL_TABLET | Freq: Two times a day (BID) | ORAL | Status: DC

## 2011-03-30 MED ORDER — LISINOPRIL 5 MG PO TABS: 5.0000 mg | ORAL_TABLET | Freq: Every day | ORAL | Status: DC

## 2011-03-30 MED ORDER — SPIRONOLACTONE 25 MG PO TABS: 25.0000 mg | ORAL_TABLET | Freq: Every day | ORAL | Status: DC

## 2011-03-30 MED ORDER — METOPROLOL TARTRATE 50 MG PO TABS: 75.0000 mg | ORAL_TABLET | Freq: Two times a day (BID) | ORAL | Status: DC

## 2011-03-30 MED ORDER — METOPROLOL TARTRATE 50 MG PO TABS
75.0000 mg | ORAL_TABLET | Freq: Two times a day (BID) | ORAL | Status: DC
  Administered 2011-03-30: 75 mg via ORAL
  Filled 2011-03-30: qty 1

## 2011-03-30 MED ORDER — GLIPIZIDE 5 MG PO TABS: 5.0000 mg | ORAL_TABLET | Freq: Two times a day (BID) | ORAL | Status: DC

## 2011-03-30 MED ORDER — DILTIAZEM HCL ER COATED BEADS 240 MG PO CP24: 240.0000 mg | ORAL_CAPSULE | Freq: Every day | ORAL | Status: DC

## 2011-03-30 NOTE — Progress Notes (Signed)
DISCHARGE INSTRUCTIONS GIVEN. FOLLOW UP APPOINTMENTS REVIEWED W/ PT. HR REMAIN IN CONTROLED ATRIAL FIB W/ RATE OF 96. RN WALLED PT TO HIS CARE. PT TOLERATED WELL.

## 2011-03-30 NOTE — Progress Notes (Signed)
Discharge summary sent to payer through [facility]  

## 2011-03-30 NOTE — Discharge Summary (Signed)
Physician Discharge Summary  Patient ID: [name] Cook MRN: [id number] DOB/AGE: 06/08/1950 60 y.o.  Admit date: 03/27/2011 Discharge date: 03/30/2011    Discharge Diagnoses:  1. 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 No evidence of clinical heart failure. 2. Atrial fibrillation, started on Coumadin. INR 2.24 on discharge. 3. Type 2 diabetes mellitus. 4. Hypertension. 5. Hyperlipidemia. 6. Ongoing tobacco abuse.   Current Discharge Medication List    START taking these medications   Details  glipiZIDE (GLUCOTROL) 5 MG tablet Take 1 tablet (5 mg total) by mouth 2 (two) times daily before a meal. Qty: 30 tablet, Refills: 0    lisinopril (PRINIVIL,ZESTRIL) 5 MG tablet Take 1 tablet (5 mg total) by mouth daily. Qty: 30 tablet, Refills: 0    spironolactone (ALDACTONE) 25 MG tablet Take 1 tablet (25 mg total) by mouth daily. Qty: 30 tablet, Refills: 0    warfarin (COUMADIN) 7.5 MG tablet Take 1 tablet (7.5 mg total) by mouth daily. Qty: 30 tablet, Refills: 0      CONTINUE these medications which have CHANGED   Details  diltiazem (CARDIZEM CD) 240 MG 24 hr capsule Take 1 capsule (240 mg total) by mouth daily. Qty: 30 capsule, Refills: 0    metoprolol (LOPRESSOR) 50 MG tablet Take 1.5 tablets (75 mg total) by mouth 2 (two) times daily. Qty: 90 tablet, Refills: 0      CONTINUE these medications which have NOT CHANGED   Details  aspirin EC 81 MG tablet Take 81 mg by mouth every morning.      buPROPion (WELLBUTRIN SR) 100 MG 12 hr tablet Take 200 mg by mouth daily.      celecoxib (CELEBREX) 200 MG capsule Take 200 mg by mouth daily.      escitalopram (LEXAPRO) 20 MG tablet Take 1 tablet (20 mg total) by mouth daily. Qty: 30 tablet, Refills: 1    fish oil-omega-3 fatty acids 1000 MG capsule Take 1 g by mouth daily.      magnesium oxide (MAG-OX) 400 MG tablet Take 1 tablet (400 mg total) by mouth daily. Qty: 30 tablet, Refills: 6    pantoprazole (PROTONIX) 40 MG tablet  Take 40 mg by mouth daily.      rosuvastatin (CRESTOR) 20 MG tablet Take 20 mg by mouth daily.        STOP taking these medications     Multiple Vitamin (MULTIVITAMIN) tablet      propafenone (RYTHMOL SR) 225 MG 12 hr capsule      sitaGLIPtan-metformin (JANUMET) 50-1000 MG per tablet         Discharged Condition: Stable.    Consults: Cardiology, Dr. Valera Castle.  Significant Diagnostic Studies: Dg Chest 2 View  03/27/2011  *RADIOLOGY REPORT*  Clinical Data: Shortness of breath which increases when lying flat, history smoking, hypertension, diabetes  CHEST - 2 VIEW  Comparison: 09/10/2010  Findings: Enlargement of cardiac silhouette. Calcified tortuous aorta. Pulmonary vascular congestion. Emphysematous and minimal bronchitic changes. Accentuation of perihilar interstitial markings, extending into bases, appears chronic. Minimal atelectasis or developing infiltrate left base with blunting of costophrenic angle posteriorly. Remaining lungs free of acute infiltrate. No pneumothorax. No acute osseous findings.  IMPRESSION: Emphysematous and bronchitic changes with chronic accentuation of interstitial markings. Minimal atelectasis or subtle infiltrate at left base with suspect tiny associated left pleural effusion.  Original Report Authenticated By: Lollie Marrow, M.D.  [phone]  ------------------------------------------------------------ Transthoracic Echocardiography  Patient: [name] Cook, [name] Cook MR #: [id number] Study Date: 03/28/2011 Gender: M Age: 60 Height: 182.9cm  Weight: 108.9kg BSA: 2.44m^2 Pt. Status: Room: IC04  SONOGRAPHER Karrie Doffing PERFORMING Delma Freeze [place] ADMITTING [place], [place] ATTENDING Tarry Kos Dia Crawford, Rachal REFERRING Onalee Hua, Rachal cc:  ------------------------------------------------------------ LV EF: 25% - 30%  ------------------------------------------------------------ Indications: Atrial fibrillation - currently SR  427.31.  ------------------------------------------------------------ History: PMH: Atrial fibrillation. PMH: Hyperlipidemia Risk factors: Current tobacco use. Diabetes mellitus.  ------------------------------------------------------------ Study Conclusions  - Left ventricle: The cavity size was at the upper limits of normal. There was mild focal basal hypertrophy of the septum. Systolic function was severely reduced. The estimated ejection fraction was in the range of 25% to 30%. Diffuse hypokinesis. The study is not technically sufficient to allow evaluation of LV diastolic function. - Aortic root: The aortic root was mildly dilated. - Mitral valve: Trivial regurgitation. - Left atrium: The atrium was mildly dilated. - Right ventricle: Systolic function was mildly reduced. - Tricuspid valve: Trivial regurgitation. - Inferior vena cava: The vessel was dilated; the respirophasic diameter changes were blunted (< 50%); findings are consistent with elevated central venous pressure. - Pericardium, extracardiac: There was no pericardial effusion. Transthoracic echocardiography. M-mode, complete 2D, spectral Doppler, and color Doppler. Height: Height: 182.9cm. Height: 72in. Weight: Weight: 108.9kg. Weight: 239.5lb. Body mass index: BMI: 32.6kg/m^2. Body surface area: BSA: 2.37m^2. Patient status: Inpatient. Location: ICU/CCU     Lab Results: Basic Metabolic Panel:  Basename 03/30/11 0524 03/29/11 0436 03/28/11 0502  NA 140 141 --  K 3.9 3.7 --  CL 105 104 --  CO2 29 27 --  GLUCOSE 126* 117* --  BUN 12 12 --  CREATININE 0.83 0.83 --  CALCIUM 9.5 9.3 --  MG -- -- 1.7  PHOS -- -- --   Liver Function Tests:  [facility] 03/30/11 0524  AST 16  ALT 18  ALKPHOS 72  BILITOT 0.3  PROT 6.2  ALBUMIN 3.6     CBC:  Basename 03/30/11 0524 03/29/11 0436 03/27/11 1850  WBC 6.4 7.3 --  NEUTROABS -- -- 5.1  HGB 13.3 14.2 --  HCT 39.6 41.7 --  MCV 93.4 93.9 --  PLT 138*  138* --    Recent Results (from the past 240 hour(s))  MRSA PCR SCREENING     Status: Normal   Collection Time   03/28/11  1:38 AM      Component Value Range Status Comment   MRSA by PCR NEGATIVE  NEGATIVE  Final      Hospital Course: This 60 year old man was admitted with symptoms of dyspnea on exertion. He was found to be in atrial fibrillation with rapid ventricular response. He did describe symptoms of PND and orthopnea for several nights. He does have a history of long-standing paroxysmal atrial fibrillation and atrial flutter, status post previous radiofrequency ablation of atrial flutter in 2001 and previous documentation of nonobstructive coronary artery disease. It was felt on admission he may have congestive heart failure although there was no solid evidence for this. He did have an echocardiogram done which was very revealing 4 reduced ejection fraction 25-30% only. He therefore has a cardiomyopathy, unclear whether this is ischemic or not. He does warrant a cardiac catheterization, his last one being in 2001. Cardiology has seen this patient and feels that cardiac catheterization is probably appropriate but not as an emergency and the initial objective would be to anticoagulate him. Therefore he was started on Coumadin and he has achieved a therapeutic INR today. Throughout his hospital stay he has remained fairly stable and did not have any real clinical evidence of  heart failure. His atrial fibrillation is variable in terms of ventricular rate-the ventricular rate drops in the low 50s at night but tends to be higher in the 110s in the daytime. He is Cardizem and metoprolol have been increased in view of this. He is Janumet has been discontinued in view of his low ejection fraction which I think is appropriate. He is being sent home on glipizide has treatment for his diabetes for the time being and this will need to be closely monitored. He is currently asymptomatic from his atrial  fibrillation.  Discharge Exam: Blood pressure 128/106, pulse 73, temperature 97.8 F (36.6 C), temperature source Oral, resp. rate 14, height 6' (1.829 m), weight 104.3 kg (229 lb 15 oz), SpO2 98.00%. He looks systemically well. Heart sounds are present and in atrial fibrillation. Lung fields are entirely clear. Jugular venous pressure not raised. Abdomen is soft nontender. He is alert and orientated without any focal neurological signs.  Disposition: Home. He is going to be discharged on Coumadin 7.5 mg daily and we will arrange for him to have his INR checked tomorrow at the cardiology clinic. He is going to be out of town in the next few days and will return for an appointment with cardiology on 04/10/2011. We'll try to arrange this appointment.  Discharge Orders    Future Orders Please Complete By Expires   Diet - low sodium heart healthy      Increase activity slowly      Discharge instructions      Comments:   Please seek medical attention if you have rapid palpitations. Please stop taking Coumadin if you have bleeding from anywhere in your body and seek medical attention immediately.      Follow-up Information    Follow up with [place] Bing, MD. Make an appointment in 9 days. (Dec 24th,2012)    Contact information:   [address] [place] [address] [phone]       Follow up with [place] Bing, MD. Make an appointment in 1 day. (For INR)    Contact information:   [address] [place] [address] [phone]          Signed: Wilson Singer Pager [phone]  03/30/2011, 8:12 AM

## 2011-03-31 ENCOUNTER — Other Ambulatory Visit: Payer: Self-pay | Admitting: *Deleted

## 2011-03-31 ENCOUNTER — Ambulatory Visit: Payer: Medicaid Other | Admitting: *Deleted

## 2011-03-31 MED ORDER — ADULT BLOOD PRESSURE CUFF LG KIT: 1.0000 | PACK | Freq: Once | Status: DC

## 2011-04-01 ENCOUNTER — Other Ambulatory Visit: Payer: Self-pay

## 2011-04-01 ENCOUNTER — Encounter (HOSPITAL_COMMUNITY): Payer: Self-pay | Admitting: *Deleted

## 2011-04-01 ENCOUNTER — Emergency Department (HOSPITAL_COMMUNITY): Payer: Medicaid Other

## 2011-04-01 ENCOUNTER — Inpatient Hospital Stay (HOSPITAL_COMMUNITY)
Admission: EM | Admit: 2011-04-01 | Discharge: 2011-04-04 | DRG: 309 | Disposition: A | Discharge status: Home / Self Care | Payer: Medicaid Other | Attending: Internal Medicine | Admitting: Internal Medicine

## 2011-04-01 DIAGNOSIS — I10 Essential (primary) hypertension: Secondary | ICD-10-CM | POA: Diagnosis present

## 2011-04-01 DIAGNOSIS — I509 Heart failure, unspecified: Secondary | ICD-10-CM | POA: Diagnosis present

## 2011-04-01 DIAGNOSIS — K219 Gastro-esophageal reflux disease without esophagitis: Secondary | ICD-10-CM

## 2011-04-01 DIAGNOSIS — I5022 Chronic systolic (congestive) heart failure: Secondary | ICD-10-CM

## 2011-04-01 DIAGNOSIS — R0600 Dyspnea, unspecified: Secondary | ICD-10-CM

## 2011-04-01 DIAGNOSIS — E119 Type 2 diabetes mellitus without complications: Secondary | ICD-10-CM

## 2011-04-01 DIAGNOSIS — I428 Other cardiomyopathies: Secondary | ICD-10-CM | POA: Diagnosis present

## 2011-04-01 DIAGNOSIS — R0609 Other forms of dyspnea: Secondary | ICD-10-CM

## 2011-04-01 DIAGNOSIS — I4891 Unspecified atrial fibrillation: Secondary | ICD-10-CM

## 2011-04-01 DIAGNOSIS — E785 Hyperlipidemia, unspecified: Secondary | ICD-10-CM

## 2011-04-01 DIAGNOSIS — I480 Paroxysmal atrial fibrillation: Secondary | ICD-10-CM

## 2011-04-01 DIAGNOSIS — I2510 Atherosclerotic heart disease of native coronary artery without angina pectoris: Secondary | ICD-10-CM | POA: Diagnosis present

## 2011-04-01 LAB — PROTIME-INR
INR: 2.15 — ABNORMAL HIGH (ref <1.50)
INR: 3.22 — ABNORMAL HIGH (ref 0.00–1.49)
Prothrombin Time: 24.7 seconds — ABNORMAL HIGH (ref 11.6–15.2)
Prothrombin Time: 33.4 seconds — ABNORMAL HIGH (ref 11.6–15.2)

## 2011-04-01 LAB — BASIC METABOLIC PANEL
BUN: 16 mg/dL (ref 6–23)
CO2: 26 mEq/L (ref 19–32)
Calcium: 9.8 mg/dL (ref 8.4–10.5)
Chloride: 103 mEq/L (ref 96–112)
Creatinine, Ser: 0.86 mg/dL (ref 0.50–1.35)
GFR calc Af Amer: >90 mL/min (ref >90)
GFR calc non Af Amer: >90 mL/min (ref >90)
Glucose, Bld: 195 mg/dL — ABNORMAL HIGH (ref 70–99)
Potassium: 4.1 mEq/L (ref 3.5–5.1)
Sodium: 137 mEq/L (ref 135–145)

## 2011-04-01 LAB — CARDIAC PANEL(CRET KIN+CKTOT+MB+TROPI)
CK, MB: 2.3 ng/mL (ref 0.3–4.0)
Relative Index: INVALID (ref 0.0–2.5)
Total CK: 63 U/L (ref 7–232)
Troponin I: <0.3 ng/mL (ref <0.30)

## 2011-04-01 LAB — CBC
HCT: 41.2 % (ref 39.0–52.0)
Hemoglobin: 14.1 g/dL (ref 13.0–17.0)
MCH: 32 pg (ref 26.0–34.0)
MCHC: 34.2 g/dL (ref 30.0–36.0)
MCV: 93.6 fL (ref 78.0–100.0)
Platelets: 163 10*3/uL (ref 150–400)
RBC: 4.4 MIL/uL (ref 4.22–5.81)
RDW: 13.7 % (ref 11.5–15.5)
WBC: 7.2 10*3/uL (ref 4.0–10.5)

## 2011-04-01 LAB — PRO B NATRIURETIC PEPTIDE: Pro B Natriuretic peptide (BNP): 1740 pg/mL — ABNORMAL HIGH (ref 0–125)

## 2011-04-01 MED ORDER — DILTIAZEM HCL 100 MG IV SOLR
5.0000 mg/h | Freq: Once | INTRAVENOUS | Status: AC
  Administered 2011-04-01: 5 mg/h via INTRAVENOUS
  Filled 2011-04-01: qty 100

## 2011-04-01 NOTE — ED Notes (Signed)
Pt c/o sob and feels like his heart is racing.

## 2011-04-01 NOTE — ED Notes (Signed)
cardizem drip increased to 37ml/hr

## 2011-04-01 NOTE — ED Provider Notes (Signed)
Scribed for [employer]. Beonca Gibb, MD, the patient was seen in room APA02/APA02 . This chart was scribed by Ellie Lunch.   CSN: [id number] Arrival date & time: 04/01/2011  8:58 PM   First MD Initiated Contact with Patient 04/01/11 2103      Chief Complaint  Patient presents with  . Shortness of Breath  . Tachycardia    (Consider location/radiation/quality/duration/timing/severity/associated sxs/prior treatment) HPI [name] Cook is a 60 y.o. male with h/o a. fibb since 2002 who presents to the Emergency Department complaining of 1 day of gradual onset SOB with associated general weakness since last night. Pt was at baseline prior to onset. Sx have been constant and unchanged since onset. Pt reports history of similar sx when he goes into a. Fib. Pt denies any current CP, abd pain, back pain, runny nose, ST, cough, diarrhea or rash. Pt was seen in ED 12/10 for same. Pt denies h/o of MI.  Cardiologist Dr. [name] at [facility].   Past Medical History  Diagnosis Date  . Palpitation   . Atrial fibrillation     ventricular rate controlled  . Hypertension   . Hyperlipidemia   . Diabetes mellitus     Past Surgical History  Procedure Date  . Cardiac catheterization 2001    normal coronaries  . Atrial ablation surgery 2001    History reviewed. No pertinent family history.  History  Substance Use Topics  . Smoking status: Current Everyday Smoker -- 0.5 packs/day for 42 years    Types: Cigarettes  . Smokeless tobacco: Never Used  . Alcohol Use: No     Review of Systems 10 Systems reviewed and are negative for acute change except as noted in the HPI.   Allergies  Review of patient's allergies indicates no known allergies.  Home Medications   Current Outpatient Rx  Name Route Sig Dispense Refill  . ASPIRIN EC 81 MG PO TBEC Oral Take 81 mg by mouth every morning.      Marland Kitchen BUPROPION HCL ER (SR) 100 MG PO TB12 Oral Take 200 mg by mouth daily.      . CELECOXIB 200 MG PO CAPS Oral  Take 200 mg by mouth daily.      Marland Kitchen DILTIAZEM HCL ER COATED BEADS 240 MG PO CP24 Oral Take 1 capsule (240 mg total) by mouth daily. 30 capsule 0  . ESCITALOPRAM OXALATE 20 MG PO TABS Oral Take 1 tablet (20 mg total) by mouth daily. 30 tablet 1  . OMEGA-3 FATTY ACIDS 1000 MG PO CAPS Oral Take 1 g by mouth daily.      Marland Kitchen GLIPIZIDE 5 MG PO TABS Oral Take 1 tablet (5 mg total) by mouth 2 (two) times daily before a meal. 30 tablet 0  . LISINOPRIL 5 MG PO TABS Oral Take 1 tablet (5 mg total) by mouth daily. 30 tablet 0  . MAGNESIUM OXIDE 400 MG PO TABS Oral Take 1 tablet (400 mg total) by mouth daily. 30 tablet 6  . METOPROLOL TARTRATE 50 MG PO TABS Oral Take 1.5 tablets (75 mg total) by mouth 2 (two) times daily. 90 tablet 0  . PANTOPRAZOLE SODIUM 40 MG PO TBEC Oral Take 40 mg by mouth daily.      Marland Kitchen ROSUVASTATIN CALCIUM 20 MG PO TABS Oral Take 20 mg by mouth at bedtime.     . SPIRONOLACTONE 25 MG PO TABS Oral Take 1 tablet (25 mg total) by mouth daily. 30 tablet 0  . WARFARIN SODIUM 7.5 MG  PO TABS Oral Take 1 tablet (7.5 mg total) by mouth daily. 30 tablet 0  . ADULT BLOOD PRESSURE CUFF LG KIT Does not apply 1 each by Does not apply route once. 1 each 0    BP 113/93  Pulse 78  Temp 97.3 F (36.3 C)  Resp 20  Ht 6' (1.829 m)  Wt 229 lb (103.874 kg)  BMI 31.06 kg/m2  SpO2 98%  Physical Exam  Nursing note and vitals reviewed. Constitutional: He is oriented to person, place, and time. He appears well-developed and well-nourished.  HENT:  Head: Normocephalic and atraumatic.  Eyes: Conjunctivae and EOM are normal. Pupils are equal, round, and reactive to light.  Neck: Normal range of motion. Neck supple.  Cardiovascular:  No murmur heard.      Irregular rhythm tachycardic  Pulmonary/Chest: Effort normal and breath sounds normal.  Abdominal: Soft. Bowel sounds are normal. There is no tenderness.  Musculoskeletal: Normal range of motion. He exhibits no edema and no tenderness.  Neurological:  He is alert and oriented to person, place, and time.  Skin: Skin is warm and dry.  Psychiatric: He has a normal mood and affect.    ED Course  Procedures (including critical care time)  CRITICAL CARE Performed by: Lear Ng.   Total critical care time: 30 min  Critical care time was exclusive of separately billable procedures and treating other patients.  Critical care was necessary to treat or prevent imminent or life-threatening deterioration.  Critical care was time spent personally by me on the following activities: development of treatment plan with patient and/or surrogate as well as nursing, discussions with consultants, evaluation of patient's response to treatment, examination of patient, obtaining history from patient or surrogate, ordering and performing treatments and interventions, ordering and review of laboratory studies, ordering and review of radiographic studies, pulse oximetry and re-evaluation of patient's condition.  DIAGNOSTIC STUDIES: Oxygen Saturation is 98% on room air, normal by my interpretation.    COORDINATION OF CARE:    EKG at time 2105. Rate of 119, atrial fibrillation with rapid ventricular response. Normal axis. Nonspecific T wave changes diffusely. EKG is not significantly changed compared to EKG performed on 03/29/2011. Labs Reviewed  BASIC METABOLIC PANEL - Abnormal; Notable for the following:    Glucose, Bld 195 (*)    All other components within normal limits  PRO B NATRIURETIC PEPTIDE - Abnormal; Notable for the following:    Pro B Natriuretic peptide (BNP) 1740.0 (*)    All other components within normal limits  PROTIME-INR - Abnormal; Notable for the following:    Prothrombin Time 33.4 (*)    INR 3.22 (*)    All other components within normal limits  CBC  CARDIAC PANEL(CRET KIN+CKTOT+MB+TROPI)   Dg Chest Portable 1 View  04/01/2011  *RADIOLOGY REPORT*  Clinical Data: Short of breath.  Atrial fibrillation  PORTABLE CHEST - 1  VIEW  Comparison: 03/27/2011  Findings: Cardiac enlargement.  Negative for heart failure. Negative for pneumonia or effusion.  Underlying COPD.  IMPRESSION: COPD and cardiac enlargement.  No acute cardiopulmonary disease.  Original Report Authenticated By: Camelia Phenes, M.D.     1. Atrial fibrillation with rapid ventricular response     11:09 PM Patient's electrolytes are okay except for glucose elevated at 195. No immediate action is necessary. His BNP is elevated at 1740 which is not unreasonable given his low EF. His INR is high therapeutic at 3.22. His cardiac enzymes are normal. Again there were no ischemic  changes noted on this EKG.  MDM  Pt with atrial fib with rate range from 80's to 120's.  Likely is cause of symptoms.  No CP.  Doubt new ACS.  Last ECHO showed EF of 25%. Recently admitted for same.  Pt is on metoprolol and diltiazem.  Also on coumadin.  Will check labs, start dilt drip and reassess for conversion.  If doesn't convert, will admit given his symptoms of weakness and SOB.    I personally performed the services described in this documentation, which was scribed in my presence. The recorded information has been reviewed and considered.          11:39 PM Pt's HR still in atrial fib, still goes from 80's to 120's, will call hospitalist for admit for poorly controlled atrial fib that is symptomatic.     Dr. Orvan Falconer to see and admit.  Gavin Pound. Oletta Lamas, MD 04/01/11 [phone]

## 2011-04-01 NOTE — ED Notes (Signed)
cardizem drip increased to 45ml/hr

## 2011-04-02 ENCOUNTER — Encounter (HOSPITAL_COMMUNITY): Payer: Self-pay | Admitting: *Deleted

## 2011-04-02 LAB — HEPATIC FUNCTION PANEL
ALT: 48 U/L (ref 0–53)
AST: 34 U/L (ref 0–37)
Albumin: 4 g/dL (ref 3.5–5.2)
Alkaline Phosphatase: 87 U/L (ref 39–117)
Bilirubin, Direct: 0.1 mg/dL (ref 0.0–0.3)
Indirect Bilirubin: 0.2 mg/dL — ABNORMAL LOW (ref 0.3–0.9)
Total Bilirubin: 0.3 mg/dL (ref 0.3–1.2)
Total Protein: 6.9 g/dL (ref 6.0–8.3)

## 2011-04-02 LAB — CBC
HCT: 38.1 % — ABNORMAL LOW (ref 39.0–52.0)
Hemoglobin: 12.9 g/dL — ABNORMAL LOW (ref 13.0–17.0)
MCH: 31.9 pg (ref 26.0–34.0)
MCHC: 33.9 g/dL (ref 30.0–36.0)
MCV: 94.1 fL (ref 78.0–100.0)
Platelets: 141 10*3/uL — ABNORMAL LOW (ref 150–400)
RBC: 4.05 MIL/uL — ABNORMAL LOW (ref 4.22–5.81)
RDW: 13.8 % (ref 11.5–15.5)
WBC: 6.3 10*3/uL (ref 4.0–10.5)

## 2011-04-02 LAB — BASIC METABOLIC PANEL
BUN: 14 mg/dL (ref 6–23)
CO2: 25 mEq/L (ref 19–32)
Calcium: 9.1 mg/dL (ref 8.4–10.5)
Chloride: 104 mEq/L (ref 96–112)
Creatinine, Ser: 0.81 mg/dL (ref 0.50–1.35)
GFR calc Af Amer: >90 mL/min (ref >90)
GFR calc non Af Amer: >90 mL/min (ref >90)
Glucose, Bld: 176 mg/dL — ABNORMAL HIGH (ref 70–99)
Potassium: 3.7 mEq/L (ref 3.5–5.1)
Sodium: 137 mEq/L (ref 135–145)

## 2011-04-02 LAB — GLUCOSE, CAPILLARY
Glucose-Capillary: 132 mg/dL — ABNORMAL HIGH (ref 70–99)
Glucose-Capillary: 116 mg/dL — ABNORMAL HIGH (ref 70–99)
Glucose-Capillary: 123 mg/dL — ABNORMAL HIGH (ref 70–99)
Glucose-Capillary: 158 mg/dL — ABNORMAL HIGH (ref 70–99)

## 2011-04-02 LAB — MAGNESIUM: Magnesium: 1.9 mg/dL (ref 1.5–2.5)

## 2011-04-02 LAB — PROTIME-INR
INR: 4.32 — ABNORMAL HIGH (ref 0.00–1.49)
Prothrombin Time: 42 seconds — ABNORMAL HIGH (ref 11.6–15.2)

## 2011-04-02 MED ORDER — GLIPIZIDE 5 MG PO TABS: 5.0000 mg | ORAL_TABLET | Freq: Two times a day (BID) | ORAL | Status: DC

## 2011-04-02 MED ORDER — ESCITALOPRAM OXALATE 10 MG PO TABS
20.0000 mg | ORAL_TABLET | Freq: Every day | ORAL | Status: DC
  Administered 2011-04-02 – 2011-04-04 (×3): 20 mg via ORAL
  Filled 2011-04-02 (×3): qty 2

## 2011-04-02 MED ORDER — BUPROPION HCL ER (SR) 100 MG PO TB12
200.0000 mg | ORAL_TABLET | Freq: Every day | ORAL | Status: DC
  Administered 2011-04-02 – 2011-04-04 (×3): 200 mg via ORAL
  Filled 2011-04-02 (×4): qty 2

## 2011-04-02 MED ORDER — FLEET ENEMA 7-19 GM/118ML RE ENEM: 1.0000 | ENEMA | Freq: Once | RECTAL | Status: AC | PRN

## 2011-04-02 MED ORDER — SODIUM CHLORIDE 0.9 % IV SOLN: 250.0000 mL | INTRAVENOUS | Status: DC | PRN

## 2011-04-02 MED ORDER — ACETAMINOPHEN 325 MG PO TABS: 650.0000 mg | ORAL_TABLET | ORAL | Status: DC | PRN

## 2011-04-02 MED ORDER — DILTIAZEM HCL 100 MG IV SOLR
5.0000 mg/h | INTRAVENOUS | Status: DC
  Administered 2011-04-02: 15 mg/h via INTRAVENOUS
  Filled 2011-04-02: qty 100

## 2011-04-02 MED ORDER — TRAZODONE HCL 50 MG PO TABS: 25.0000 mg | ORAL_TABLET | Freq: Every evening | ORAL | Status: DC | PRN

## 2011-04-02 MED ORDER — ACETAMINOPHEN 650 MG RE SUPP: 650.0000 mg | Freq: Four times a day (QID) | RECTAL | Status: DC | PRN

## 2011-04-02 MED ORDER — DILTIAZEM HCL ER COATED BEADS 240 MG PO CP24
240.0000 mg | ORAL_CAPSULE | Freq: Every day | ORAL | Status: DC
  Administered 2011-04-02 – 2011-04-03 (×3): 240 mg via ORAL
  Filled 2011-04-02 (×3): qty 1

## 2011-04-02 MED ORDER — INSULIN ASPART 100 UNIT/ML ~~LOC~~ SOLN: 0.0000 [IU] | Freq: Every day | SUBCUTANEOUS | Status: DC

## 2011-04-02 MED ORDER — CELECOXIB 100 MG PO CAPS
200.0000 mg | ORAL_CAPSULE | Freq: Every day | ORAL | Status: DC
  Administered 2011-04-02 – 2011-04-04 (×3): 200 mg via ORAL
  Filled 2011-04-02: qty 2
  Filled 2011-04-02 (×2): qty 1
  Filled 2011-04-02: qty 2

## 2011-04-02 MED ORDER — METOPROLOL TARTRATE 50 MG PO TABS
100.0000 mg | ORAL_TABLET | Freq: Two times a day (BID) | ORAL | Status: DC
  Administered 2011-04-02 – 2011-04-04 (×5): 100 mg via ORAL
  Filled 2011-04-02 (×5): qty 2

## 2011-04-02 MED ORDER — OMEGA-3-ACID ETHYL ESTERS 1 G PO CAPS
1.0000 g | ORAL_CAPSULE | Freq: Two times a day (BID) | ORAL | Status: DC
  Administered 2011-04-02 – 2011-04-03 (×3): 1 g via ORAL
  Filled 2011-04-02 (×4): qty 1

## 2011-04-02 MED ORDER — LISINOPRIL 10 MG PO TABS
5.0000 mg | ORAL_TABLET | Freq: Every day | ORAL | Status: DC
  Administered 2011-04-02 – 2011-04-03 (×2): 5 mg via ORAL
  Filled 2011-04-02: qty 1
  Filled 2011-04-02: qty 2

## 2011-04-02 MED ORDER — BISACODYL 10 MG RE SUPP: 10.0000 mg | Freq: Every day | RECTAL | Status: DC | PRN

## 2011-04-02 MED ORDER — POTASSIUM CHLORIDE 10 MEQ/100ML IV SOLN
INTRAVENOUS | Status: AC
  Filled 2011-04-02: qty 300

## 2011-04-02 MED ORDER — GLIPIZIDE 5 MG PO TABS
7.5000 mg | ORAL_TABLET | Freq: Two times a day (BID) | ORAL | Status: DC
  Administered 2011-04-02 – 2011-04-04 (×5): 7.5 mg via ORAL
  Filled 2011-04-02 (×7): qty 2

## 2011-04-02 MED ORDER — INSULIN ASPART 100 UNIT/ML ~~LOC~~ SOLN
0.0000 [IU] | Freq: Three times a day (TID) | SUBCUTANEOUS | Status: DC
  Administered 2011-04-02 – 2011-04-03 (×2): 1 [IU] via SUBCUTANEOUS
  Filled 2011-04-02: qty 3

## 2011-04-02 MED ORDER — METOPROLOL TARTRATE 50 MG PO TABS
75.0000 mg | ORAL_TABLET | Freq: Two times a day (BID) | ORAL | Status: DC
  Administered 2011-04-02: 75 mg via ORAL
  Filled 2011-04-02: qty 1

## 2011-04-02 MED ORDER — ONDANSETRON HCL 4 MG PO TABS: 4.0000 mg | ORAL_TABLET | Freq: Four times a day (QID) | ORAL | Status: DC | PRN

## 2011-04-02 MED ORDER — OMEGA-3 FATTY ACIDS 1000 MG PO CAPS
1.0000 g | ORAL_CAPSULE | Freq: Every day | ORAL | Status: DC
  Filled 2011-04-02 (×2): qty 1

## 2011-04-02 MED ORDER — PANTOPRAZOLE SODIUM 40 MG PO TBEC
40.0000 mg | DELAYED_RELEASE_TABLET | Freq: Every day | ORAL | Status: DC
  Administered 2011-04-02 – 2011-04-03 (×2): 40 mg via ORAL
  Filled 2011-04-02 (×2): qty 1

## 2011-04-02 MED ORDER — ROSUVASTATIN CALCIUM 20 MG PO TABS
20.0000 mg | ORAL_TABLET | Freq: Every day | ORAL | Status: DC
  Administered 2011-04-02 – 2011-04-03 (×2): 20 mg via ORAL
  Filled 2011-04-02 (×2): qty 1

## 2011-04-02 MED ORDER — SPIRONOLACTONE 25 MG PO TABS
25.0000 mg | ORAL_TABLET | Freq: Every day | ORAL | Status: DC
  Administered 2011-04-02 – 2011-04-03 (×2): 25 mg via ORAL
  Filled 2011-04-02 (×2): qty 1

## 2011-04-02 MED ORDER — ASPIRIN EC 81 MG PO TBEC
81.0000 mg | DELAYED_RELEASE_TABLET | Freq: Every day | ORAL | Status: DC
  Administered 2011-04-02 – 2011-04-03 (×2): 81 mg via ORAL
  Filled 2011-04-02 (×2): qty 1

## 2011-04-02 MED ORDER — ONDANSETRON HCL 4 MG/2ML IJ SOLN: 4.0000 mg | Freq: Four times a day (QID) | INTRAMUSCULAR | Status: DC | PRN

## 2011-04-02 MED ORDER — MAGNESIUM OXIDE 400 MG PO TABS
400.0000 mg | ORAL_TABLET | Freq: Every day | ORAL | Status: DC
  Administered 2011-04-02 – 2011-04-03 (×2): 400 mg via ORAL
  Filled 2011-04-02 (×2): qty 1

## 2011-04-02 NOTE — Progress Notes (Signed)
ANTICOAGULATION CONSULT NOTE - Initial Consult  Pharmacy Consult for  Warfarin (continuation of home therapy) Indication: atrial fibrillation  No Known Allergies  Patient Measurements: Height: 6' (182.9 cm) Weight: 231 lb 11.3 oz (105.1 kg) IBW/kg (Calculated) : 77.6  Adjusted Body Weight: n/a  Vital Signs: Temp: 98.1 F (36.7 C) (12/16 0400) Temp src: Oral (12/16 0400) BP: 101/62 mmHg (12/16 0635) Pulse Rate: 52  (12/16 0635)  Labs:  Basename 04/02/11 0520 04/01/11 2135 04/01/11 2125 03/31/11 1035  HGB 12.9* -- 14.1 --  HCT 38.1* -- 41.2 --  PLT 141* -- 163 --  APTT -- -- -- --  LABPROT 42.0* 33.4* -- 24.7*  INR 4.32* 3.22* -- 2.15*  HEPARINUNFRC -- -- -- --  CREATININE 0.81 -- 0.86 --  CKTOTAL -- -- 63 --  CKMB -- -- 2.3 --  TROPONINI -- -- <0.30 --   Estimated Creatinine Clearance: 124.6 ml/min (by C-G formula based on Cr of 0.81).  Medical History: Past Medical History  Diagnosis Date  . Palpitation   . Atrial fibrillation     ventricular rate controlled  . Hypertension   . Hyperlipidemia   . Diabetes mellitus     Medications:  Scheduled:    . aspirin EC  81 mg Oral Daily  . buPROPion  200 mg Oral Daily  . celecoxib  200 mg Oral Daily  . diltiazem  240 mg Oral QHS  . diltiazem (CARDIZEM) infusion  5-15 mg/hr Intravenous Once  . escitalopram  20 mg Oral Daily  . fish oil-omega-3 fatty acids  1 g Oral Daily  . glipiZIDE  7.5 mg Oral BID AC  . insulin aspart  0-5 Units Subcutaneous QHS  . insulin aspart  0-9 Units Subcutaneous TID WC  . lisinopril  5 mg Oral Daily  . magnesium oxide  400 mg Oral Daily  . metoprolol  100 mg Oral BID  . pantoprazole  40 mg Oral Daily  . rosuvastatin  20 mg Oral QHS  . spironolactone  25 mg Oral Daily  . DISCONTD: glipiZIDE  5 mg Oral BID AC  . DISCONTD: metoprolol  75 mg Oral BID    Assessment: Supra-therapeutic INR. Possible interaction with Celebrex. Goal of Therapy:  INR 2-3   Plan:  Daily INR Recently  educated. No Warfarin today.  Gilman Buttner, [place] J 04/02/2011,8:14 AM

## 2011-04-02 NOTE — Progress Notes (Addendum)
Subjective: This man was readmitted after having been discharged in a couple of days ago with the same symptoms of rapid palpitations associated with dyspnea. He has been put on a Cardizem drip and his ventricular rate is better controlled now. Now his breathing is better.           Physical Exam: Blood pressure 101/62, pulse 52, temperature 98.1 F (36.7 C), temperature source Oral, resp. rate 22, height 6' (1.829 m), weight 105.1 kg (231 lb 11.3 oz), SpO2 96.00%. He looks systemically well and does not appear to be in any acute respiratory distress. Heart sounds are present and irregular still consistent with atrial fibrillation but ventricular rate much better control. Jugular venous pressure not elevated. Lung fields are entirely clear. There is no wheezing, crackles or bronchial breathing. He is alert and orientated.   Investigations:  Recent Results (from the past 240 hour(s))  MRSA PCR SCREENING     Status: Normal   Collection Time   03/28/11  1:38 AM      Component Value Range Status Comment   MRSA by PCR NEGATIVE  NEGATIVE  Final   MRSA PCR SCREENING     Status: Abnormal   Collection Time   04/02/11 12:32 AM      Component Value Range Status Comment   MRSA by PCR INVALID RESULTS, SPECIMEN SENT FOR CULTURE (*) NEGATIVE  Final      Basic Metabolic Panel:  Basename 04/02/11 0520 04/01/11 2125  NA 137 137  K 3.7 4.1  CL 104 103  CO2 25 26  GLUCOSE 176* 195*  BUN 14 16  CREATININE 0.81 0.86  CALCIUM 9.1 9.8  MG -- 1.9  PHOS -- --   Liver Function Tests:  [facility] 04/01/11 2125  AST 34  ALT 48  ALKPHOS 87  BILITOT 0.3  PROT 6.9  ALBUMIN 4.0     CBC:  Basename 04/02/11 0520 04/01/11 2125  WBC 6.3 7.2  NEUTROABS -- --  HGB 12.9* 14.1  HCT 38.1* 41.2  MCV 94.1 93.6  PLT 141* 163    Dg Chest Portable 1 View  04/01/2011  *RADIOLOGY REPORT*  Clinical Data: Short of breath.  Atrial fibrillation  PORTABLE CHEST - 1 VIEW  Comparison: 03/27/2011   Findings: Cardiac enlargement.  Negative for heart failure. Negative for pneumonia or effusion.  Underlying COPD.  IMPRESSION: COPD and cardiac enlargement.  No acute cardiopulmonary disease.  Original Report Authenticated By: Camelia Phenes, M.D.      Medications:  Scheduled:   . aspirin EC  81 mg Oral Daily  . buPROPion  200 mg Oral Daily  . celecoxib  200 mg Oral Daily  . diltiazem  240 mg Oral QHS  . diltiazem (CARDIZEM) infusion  5-15 mg/hr Intravenous Once  . escitalopram  20 mg Oral Daily  . fish oil-omega-3 fatty acids  1 g Oral Daily  . glipiZIDE  5 mg Oral BID AC  . insulin aspart  0-5 Units Subcutaneous QHS  . insulin aspart  0-9 Units Subcutaneous TID WC  . lisinopril  5 mg Oral Daily  . magnesium oxide  400 mg Oral Daily  . metoprolol  100 mg Oral BID  . pantoprazole  40 mg Oral Daily  . rosuvastatin  20 mg Oral QHS  . spironolactone  25 mg Oral Daily  . DISCONTD: metoprolol  75 mg Oral BID    Impression: 1. Atrial fibrillation with rapid ventricular response, better rate controlled now. On Coumadin, slightly supratherapeutic today.  INR 4.32. Pharmacy monitoring Coumadin. 2. 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 No evidence of clinical heart failure. 3. Type 2 diabetes mellitus, on glipizide and sliding scale. 4. Hypertension. 5. Hyperlipidemia. 6. Ongoing tobacco abuse.     Plan: 1. Reduce Cardizem drip and discontinue. 2. Increase metoprolol 200 mg twice a day. 3. Increase glipizide to 7.5 mg twice a day for better diabetic control. 4. Advised patient to discontinue caffeinated products. He does not drink alcohol. He has not smoked cigarettes since being discharged a couple of days ago.     LOS: 1 day   Wilson Singer Pager [phone]  04/02/2011, 7:37 AM

## 2011-04-02 NOTE — H&P (Signed)
PCP:   Public health dept  Cardiologist: [facility]  Chief Complaint:  Palpitations and shortness of breath since today  HPI: [name] Cook is an 60 y.o. obese Caucasian male.  Atrial fibrillation on long-term anticoagulation; discharge from this facility 2 days ago after adjustment of his rate control meds. Patient has been unable to rest today because of palpitation difficulty breathing. He denies chest pain denies syncope. He came to the emergency room and was placed on a Cardizem drip for atrial fibrillation with rapid rate. However, despite the trip being maintained at a maximum rate for some 2 hours now, his heart rate is swinging rapidly between 80 and 130 beats per minute. Hospitalist service has been called to assist with management.  Patient admits to using a diet caffeinated drinks since discharge; denies alcohol or any other stimulant use. He takes his long-acting diltiazem at bedtime, and has therefore not taken today's dose because he has been getting treated in the emergency room; for the same reason he has not taken his evening dose of Lopressor.  He does not use tobacco since discharge from hospital 2 days ago.  Rewiew of Systems:  The patient denies anorexia, fever, weight loss,, vision loss, decreased hearing, hoarseness, chest pain, syncope,  peripheral edema, balance deficits, hemoptysis, abdominal pain, melena, hematochezia, severe indigestion/heartburn, hematuria, incontinence, genital sores, muscle weakness, suspicious skin lesions, transient blindness, difficulty walking, depression, unusual weight change, abnormal bleeding, enlarged lymph nodes, angioedema, and breast masses.    Past Medical History  Diagnosis Date  . Palpitation   . Atrial fibrillation     ventricular rate controlled  . Hypertension   . Hyperlipidemia   . Diabetes mellitus     Past Surgical History  Procedure Date  . Cardiac catheterization 2001    normal coronaries  . Atrial  ablation surgery 2001    Medications:  HOME MEDS: Prior to Admission medications   Medication Sig Start Date End Date Taking? Authorizing Provider  aspirin EC 81 MG tablet Take 81 mg by mouth every morning.     Yes Historical Provider, MD  buPROPion (WELLBUTRIN SR) 100 MG 12 hr tablet Take 200 mg by mouth daily.     Yes Historical Provider, MD  celecoxib (CELEBREX) 200 MG capsule Take 200 mg by mouth daily.     Yes Historical Provider, MD  diltiazem (CARDIZEM CD) 240 MG 24 hr capsule Take 1 capsule (240 mg total) by mouth daily. 03/30/11 03/29/12 Yes Nimish C Gosrani  escitalopram (LEXAPRO) 20 MG tablet Take 1 tablet (20 mg total) by mouth daily. 10/27/10  Yes [name]. Rothbart, MD  fish oil-omega-3 fatty acids 1000 MG capsule Take 1 g by mouth daily.     Yes Historical Provider, MD  glipiZIDE (GLUCOTROL) 5 MG tablet Take 1 tablet (5 mg total) by mouth 2 (two) times daily before a meal. 03/30/11 03/29/12 Yes Nimish C Gosrani  lisinopril (PRINIVIL,ZESTRIL) 5 MG tablet Take 1 tablet (5 mg total) by mouth daily. 03/30/11 03/29/12 Yes Nimish C Gosrani  magnesium oxide (MAG-OX) 400 MG tablet Take 1 tablet (400 mg total) by mouth daily. 11/09/10  Yes Joni Reining, NP  metoprolol (LOPRESSOR) 50 MG tablet Take 1.5 tablets (75 mg total) by mouth 2 (two) times daily. 03/30/11 03/29/12 Yes Nimish C Gosrani  pantoprazole (PROTONIX) 40 MG tablet Take 40 mg by mouth daily.     Yes Historical Provider, MD  rosuvastatin (CRESTOR) 20 MG tablet Take 20 mg by mouth at bedtime.  Yes Historical Provider, MD  spironolactone (ALDACTONE) 25 MG tablet Take 1 tablet (25 mg total) by mouth daily. 03/30/11 03/29/12 Yes Nimish C Gosrani  warfarin (COUMADIN) 7.5 MG tablet Take 1 tablet (7.5 mg total) by mouth daily. 03/30/11 03/29/12 Yes Nimish C Gosrani  Blood Pressure Monitoring (ADULT BLOOD PRESSURE CUFF LG) KIT 1 each by Does not apply route once. 03/31/11   [name]. Rothbart, MD     Allergies:  No Known  Allergies  Social History:   reports that he has been smoking Cigarettes.  He has a 21 pack-year smoking history. He has never used smokeless tobacco. He reports that he does not drink alcohol or use illicit drugs.  Family History: History reviewed. No pertinent family history.   Physical Exam: Filed Vitals:   04/01/11 2315 04/01/11 2330 04/01/11 2345 04/02/11 0040  BP:  123/97  120/83  Pulse: 48 70 77 98  Temp:    97.5 F (36.4 C)  TempSrc:    Oral  Resp: 11 13 17 25   Height:    6' (1.829 m)  Weight:    101.8 kg (224 lb 6.9 oz)  SpO2: 96% 96% 98% 96%   Blood pressure 120/83, pulse 98, temperature 97.5 F (36.4 C), temperature source Oral, resp. rate 25, height 6' (1.829 m), weight 101.8 kg (224 lb 6.9 oz), SpO2 96.00%.  GEN:  Pleasant pleasant obese Caucasian gentleman lying in the stretcher in no acute distress; cooperative with exam PSYCH:  alert and oriented x4; does not appear anxious does not appear depressed; affect is normal HEENT: Mucous membranes pink mild dehydration and anicteric; PERRLA; EOM intact; no cervical lymphadenopathy nor thyromegaly or carotid bruit; thick neck  Breasts:: Not examined CHEST WALL: No tenderness CHEST: Normal respiration, clear to auscultation bilaterally HEART: Irregularly irregular rhythm; rate swings between 80 and 136 White i-STAT and watching the monitor; BACK: No kyphosis or scoliosis; no CVA tenderness ABDOMEN: Obese, soft non-tender; no masses, no organomegaly, normal abdominal bowel sounds; no pannus; Rectal Exam: Not done EXTREMITIES: age-appropriate arthropathy of the hands and knees; no edema; no ulcerations. Genitalia: not examined PULSES: 2+ and symmetric SKIN: Normal hydration no rash or ulceration CNS: Cranial nerves 2-12 grossly intact no focal neurologic deficit   Labs & Imaging Results for orders placed during the hospital encounter of 04/01/11 (from the past 48 hour(s))  CBC     Status: Normal   Collection Time    04/01/11  9:25 PM      Component Value Range Comment   WBC 7.2  4.0 - 10.5 (K/uL)    RBC 4.40  4.22 - 5.81 (MIL/uL)    Hemoglobin 14.1  13.0 - 17.0 (g/dL)    HCT 14.7  82.9 - 56.2 (%)    MCV 93.6  78.0 - 100.0 (fL)    MCH 32.0  26.0 - 34.0 (pg)    MCHC 34.2  30.0 - 36.0 (g/dL)    RDW 13.0  86.5 - 78.4 (%)    Platelets 163  150 - 400 (K/uL)   BASIC METABOLIC PANEL     Status: Abnormal   Collection Time   04/01/11  9:25 PM      Component Value Range Comment   Sodium 137  135 - 145 (mEq/L)    Potassium 4.1  3.5 - 5.1 (mEq/L)    Chloride 103  96 - 112 (mEq/L)    CO2 26  19 - 32 (mEq/L)    Glucose, Bld 195 (*) 70 - 99 (mg/dL)  BUN 16  6 - 23 (mg/dL)    Creatinine, Ser 1.61  0.50 - 1.35 (mg/dL)    Calcium 9.8  8.4 - 10.5 (mg/dL)    GFR calc non Af Amer >90  >90 (mL/min)    GFR calc Af Amer >90  >90 (mL/min)   PRO B NATRIURETIC PEPTIDE     Status: Abnormal   Collection Time   04/01/11  9:25 PM      Component Value Range Comment   Pro B Natriuretic peptide (BNP) 1740.0 (*) 0 - 125 (pg/mL)   CARDIAC PANEL(CRET KIN+CKTOT+MB+TROPI)     Status: Normal   Collection Time   04/01/11  9:25 PM      Component Value Range Comment   Total CK 63  7 - 232 (U/L)    CK, MB 2.3  0.3 - 4.0 (ng/mL)    Troponin I <0.30  <0.30 (ng/mL)    Relative Index RELATIVE INDEX IS INVALID  0.0 - 2.5    PROTIME-INR     Status: Abnormal   Collection Time   04/01/11  9:35 PM      Component Value Range Comment   Prothrombin Time 33.4 (*) 11.6 - 15.2 (seconds)    INR 3.22 (*) 0.00 - 1.49     Dg Chest Portable 1 View  04/01/2011  *RADIOLOGY REPORT*  Clinical Data: Short of breath.  Atrial fibrillation  PORTABLE CHEST - 1 VIEW  Comparison: 03/27/2011  Findings: Cardiac enlargement.  Negative for heart failure. Negative for pneumonia or effusion.  Underlying COPD.  IMPRESSION: COPD and cardiac enlargement.  No acute cardiopulmonary disease.  Original Report Authenticated By: Camelia Phenes, M.D.       Assessment Present on Admission:  .Atrial fibrillation with rapid ventricular response .DIABETES MELLITUS, TYPE II .DOE (dyspnea on exertion) .GASTROESOPHAGEAL REFLUX DISEASE .HYPERLIPIDEMIA  chronic systolic heart failure   PLAN: We'll admit this gentleman for rate control. Give him his evening dose of rate control meds Lopressor and Cardizem; and titrate his Cardizem drip. With his markedly depressed ejection fraction he may benefit from the addition of digoxin at leave this to his cardiologist. Has also any discussions of AICD placement.  We'll continue her ACE inhibitor along with Lopressor and diuretic for his systolic heart failure.  Continue home management of his diabetes and add a sliding scale coverage.   Other plans as per orders.    Iban Utz 04/02/2011, 1:08 AM

## 2011-04-03 DIAGNOSIS — I4891 Unspecified atrial fibrillation: Principal | ICD-10-CM

## 2011-04-03 DIAGNOSIS — I5022 Chronic systolic (congestive) heart failure: Secondary | ICD-10-CM

## 2011-04-03 LAB — GLUCOSE, CAPILLARY
Glucose-Capillary: 139 mg/dL — ABNORMAL HIGH (ref 70–99)
Glucose-Capillary: 134 mg/dL — ABNORMAL HIGH (ref 70–99)
Glucose-Capillary: 124 mg/dL — ABNORMAL HIGH (ref 70–99)
Glucose-Capillary: 170 mg/dL — ABNORMAL HIGH (ref 70–99)

## 2011-04-03 LAB — CBC
HCT: 41.4 % (ref 39.0–52.0)
Hemoglobin: 13.8 g/dL (ref 13.0–17.0)
MCH: 31.3 pg (ref 26.0–34.0)
MCHC: 33.3 g/dL (ref 30.0–36.0)
MCV: 93.9 fL (ref 78.0–100.0)
Platelets: 156 10*3/uL (ref 150–400)
RBC: 4.41 MIL/uL (ref 4.22–5.81)
RDW: 13.8 % (ref 11.5–15.5)
WBC: 7.7 10*3/uL (ref 4.0–10.5)

## 2011-04-03 LAB — COMPREHENSIVE METABOLIC PANEL
ALT: 55 U/L — ABNORMAL HIGH (ref 0–53)
AST: 45 U/L — ABNORMAL HIGH (ref 0–37)
Albumin: 3.6 g/dL (ref 3.5–5.2)
Alkaline Phosphatase: 83 U/L (ref 39–117)
BUN: 14 mg/dL (ref 6–23)
CO2: 28 mEq/L (ref 19–32)
Calcium: 9.6 mg/dL (ref 8.4–10.5)
Chloride: 103 mEq/L (ref 96–112)
Creatinine, Ser: 0.87 mg/dL (ref 0.50–1.35)
GFR calc Af Amer: >90 mL/min (ref >90)
GFR calc non Af Amer: >90 mL/min (ref >90)
Glucose, Bld: 146 mg/dL — ABNORMAL HIGH (ref 70–99)
Potassium: 3.9 mEq/L (ref 3.5–5.1)
Sodium: 137 mEq/L (ref 135–145)
Total Bilirubin: 0.2 mg/dL — ABNORMAL LOW (ref 0.3–1.2)
Total Protein: 6.5 g/dL (ref 6.0–8.3)

## 2011-04-03 LAB — MRSA PCR SCREENING: MRSA by PCR: INVALID — AB

## 2011-04-03 MED ORDER — FUROSEMIDE 40 MG PO TABS
40.0000 mg | ORAL_TABLET | Freq: Every day | ORAL | Status: DC
  Administered 2011-04-03 – 2011-04-04 (×2): 40 mg via ORAL
  Filled 2011-04-03 (×2): qty 1

## 2011-04-03 MED ORDER — LISINOPRIL 10 MG PO TABS
10.0000 mg | ORAL_TABLET | Freq: Every day | ORAL | Status: DC
  Administered 2011-04-04: 10 mg via ORAL
  Filled 2011-04-03: qty 1

## 2011-04-03 NOTE — Progress Notes (Signed)
Transfer report called to [place] on 300. Pt alert and oriented . Hr hr 112 in atrial fib. Other vs stable. Lungs clear but diminished. Rt hand nsl patent.pt has been seen by dr [name]

## 2011-04-03 NOTE — Progress Notes (Addendum)
Subjective: This man has been off the Cardizem drip and is on oral medications now. His ventricular rate is very volatile and he tells me that when it is in the high 120s or 130s he feels bad. He feels swimmy headed.           Physical Exam: Blood pressure 107/79, pulse 70, temperature 97.7 F (36.5 C), temperature source Oral, resp. rate 18, height 6' (1.829 m), weight 105.1 kg (231 lb 11.3 oz), SpO2 93.00%. He looks systemically well and does not appear to be in any acute respiratory distress. Heart sounds are present and irregular still consistent with atrial fibrillation but ventricular rate much better control. Jugular venous pressure not elevated. Lung fields are entirely clear. There is no wheezing, crackles or bronchial breathing. He is alert and orientated.   Investigations:  Recent Results (from the past 240 hour(s))  MRSA PCR SCREENING     Status: Normal   Collection Time   03/28/11  1:38 AM      Component Value Range Status Comment   MRSA by PCR NEGATIVE  NEGATIVE  Final   MRSA PCR SCREENING     Status: Abnormal   Collection Time   04/02/11 12:32 AM      Component Value Range Status Comment   MRSA by PCR INVALID RESULTS, SPECIMEN SENT FOR CULTURE (*) NEGATIVE  Final      Basic Metabolic Panel:  Basename 04/03/11 0428 04/02/11 0520 04/01/11 2125  NA 137 137 --  K 3.9 3.7 --  CL 103 104 --  CO2 28 25 --  GLUCOSE 146* 176* --  BUN 14 14 --  CREATININE 0.87 0.81 --  CALCIUM 9.6 9.1 --  MG -- -- 1.9  PHOS -- -- --   Liver Function Tests:  [facility] 04/03/11 0428 04/01/11 2125  AST 45* 34  ALT 55* 48  ALKPHOS 83 87  BILITOT 0.2* 0.3  PROT 6.5 6.9  ALBUMIN 3.6 4.0     CBC:  Basename 04/03/11 0428 04/02/11 0520  WBC 7.7 6.3  NEUTROABS -- --  HGB 13.8 12.9*  HCT 41.4 38.1*  MCV 93.9 94.1  PLT 156 141*    Dg Chest Portable 1 View  04/01/2011  *RADIOLOGY REPORT*  Clinical Data: Short of breath.  Atrial fibrillation  PORTABLE CHEST - 1 VIEW   Comparison: 03/27/2011  Findings: Cardiac enlargement.  Negative for heart failure. Negative for pneumonia or effusion.  Underlying COPD.  IMPRESSION: COPD and cardiac enlargement.  No acute cardiopulmonary disease.  Original Report Authenticated By: Camelia Phenes, M.D.      Medications:  Scheduled:    . aspirin EC  81 mg Oral Daily  . buPROPion  200 mg Oral Daily  . celecoxib  200 mg Oral Daily  . diltiazem  240 mg Oral QHS  . escitalopram  20 mg Oral Daily  . glipiZIDE  7.5 mg Oral BID AC  . insulin aspart  0-5 Units Subcutaneous QHS  . insulin aspart  0-9 Units Subcutaneous TID WC  . lisinopril  5 mg Oral Daily  . magnesium oxide  400 mg Oral Daily  . metoprolol  100 mg Oral BID  . omega-3 acid ethyl esters  1 g Oral BID  . pantoprazole  40 mg Oral Daily  . potassium chloride      . rosuvastatin  20 mg Oral QHS  . spironolactone  25 mg Oral Daily  . DISCONTD: fish oil-omega-3 fatty acids  1 g Oral Daily  .  DISCONTD: glipiZIDE  5 mg Oral BID AC    Impression: 1. Atrial fibrillation with rapid ventricular response, better rate controlled now. On Coumadin, slightly supratherapeutic today. INR 4.32. Pharmacy monitoring Coumadin. 2. 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 No evidence of clinical heart failure. 3. Type 2 diabetes mellitus, on glipizide and sliding scale, better controlled now. 4. Hypertension. 5. Hyperlipidemia. 6. Ongoing tobacco abuse.     Plan: 1. Continue with current medications. 2. Await cardiology consultation. I think this man may benefit from amiodarone at this point or at least digoxin. 3. He can move to telemetry.     LOS: 2 days   Wilson Singer Pager [phone]  04/03/2011, 7:38 AM

## 2011-04-03 NOTE — Consult Note (Signed)
CARDIOLOGY CONSULT NOTE  Patient ID: [name] Cook MRN: [id number] DOB/AGE: 11-18-1950 60 y.o.  Admit date: 04/01/2011 Referring Physician: [facility] Primary Physician: [facility] Primary Cardiologist: Tenzin Edelman Reason for Consultation: Afib with RVR  Active Problems:  DIABETES MELLITUS, TYPE II  HYPERLIPIDEMIA  GASTROESOPHAGEAL REFLUX DISEASE  DOE (dyspnea on exertion)  Atrial fibrillation with rapid ventricular response  Chronic systolic heart failure  HPI: [name] is a 60 year old patient of Dr. Pueblito Bing that we saw recently on consult the 5 days ago while he was hospitalized for A. fib with RVR. The patient was discharged on 03/30/2011 after heart rate control was completed using metoprolol and Cardizem. The patient has history of tachycardia cardiac mediated cardiomyopathy with recent echocardiogram completed during last admission revealing an EF of 25-30% with diffuse hypokinesis. He has a history of Atrial Flutter ablation in 2001..During recent admission the patient was also found to be in heart failure. He was diuresed appropriately on IV Lasix. Rythmol was discontinued.  He was placed on diltiazem 240 mg daily along with metoprolol 75 mg twice a day.       Unfortunately the patient was readmitted on 04/03/2011 after noting dyspnea, lightheaded,  feeling fullness in his head and generalized weakness while shopping. He went to a local [employer] and have his blood pressure checked it was found to have a heart rate of 128 beats per minute. He called the ER and was advised to come in to be reevaluated. On arrival to the emergency room the patient's blood pressure was 113/93 with a heart rate of 123 beats per minute. He had not taken his medications that morning yet. He was treated with Cardizem IV bolus and placed on a Cardizem drip.Marland Kitchen He is since been taken off the Cardizem drip and returned to by mouth Cardizem to 240 mg daily. Metoprolol has been increased to 100 mg twice a  day. Heart rate remains elevated at 100 beats per minute per telemetry. He is also continued on Coumadin with INR 2.15. He denies medical noncompliance as he was able to afford his medications as he is on Medicaid now.     Since admission the patient is diuresis approximately 3 L of urine on medications prescribed prior to discharge. He is on spironolactone, and lisinopril. He is not on a diuretic at this time. Chest x-ray on admission was negative for heart failure. The patient denied any shortness of breath or chest pressure. His only complaint was fullness in his head and generalized fatigue. He is feeling much better now. We are asked for more recommendations concerning atrial fibrillation rate control. We planned to discuss the need for AICD pacemaker on followup appointment and have him seen by EP.  Review of systems complete and found to be negative unless listed above   Past Medical History  Diagnosis Date  . Palpitation   . Atrial fibrillation     ventricular rate controlled  . Hypertension   . Hyperlipidemia   . Diabetes mellitus     History reviewed. No pertinent family history.  History   Social History  . Marital Status: Single    Spouse Name: N/A    Number of Children: N/A  . Years of Education: N/A   Occupational History  . disabled    Social History Main Topics  . Smoking status: Current Everyday Smoker -- 0.5 packs/day for 42 years    Types: Cigarettes  . Smokeless tobacco: Never Used  . Alcohol Use: No  . Drug  Use: No  . Sexually Active: Not on file   Other Topics Concern  . Not on file   Social History Narrative  . No narrative on file    Past Surgical History  Procedure Date  . Cardiac catheterization 2001    normal coronaries  . Atrial ablation surgery 2001     Prescriptions prior to admission  Medication Sig Dispense Refill  . aspirin EC 81 MG tablet Take 81 mg by mouth every morning.        Marland Kitchen buPROPion (WELLBUTRIN SR) 100 MG 12 hr tablet Take  200 mg by mouth daily.        . celecoxib (CELEBREX) 200 MG capsule Take 200 mg by mouth daily.        Marland Kitchen diltiazem (CARDIZEM CD) 240 MG 24 hr capsule Take 1 capsule (240 mg total) by mouth daily.  30 capsule  0  . escitalopram (LEXAPRO) 20 MG tablet Take 1 tablet (20 mg total) by mouth daily.  30 tablet  1  . fish oil-omega-3 fatty acids 1000 MG capsule Take 1 g by mouth daily.        Marland Kitchen glipiZIDE (GLUCOTROL) 5 MG tablet Take 1 tablet (5 mg total) by mouth 2 (two) times daily before a meal.  30 tablet  0  . lisinopril (PRINIVIL,ZESTRIL) 5 MG tablet Take 1 tablet (5 mg total) by mouth daily.  30 tablet  0  . magnesium oxide (MAG-OX) 400 MG tablet Take 1 tablet (400 mg total) by mouth daily.  30 tablet  6  . metoprolol (LOPRESSOR) 50 MG tablet Take 1.5 tablets (75 mg total) by mouth 2 (two) times daily.  90 tablet  0  . pantoprazole (PROTONIX) 40 MG tablet Take 40 mg by mouth daily.        . rosuvastatin (CRESTOR) 20 MG tablet Take 20 mg by mouth at bedtime.       Marland Kitchen spironolactone (ALDACTONE) 25 MG tablet Take 1 tablet (25 mg total) by mouth daily.  30 tablet  0  . warfarin (COUMADIN) 7.5 MG tablet Take 1 tablet (7.5 mg total) by mouth daily.  30 tablet  0  . Blood Pressure Monitoring (ADULT BLOOD PRESSURE CUFF LG) KIT 1 each by Does not apply route once.  1 each  0    Physical Exam: Blood pressure 107/79, pulse 70, temperature 97.7 F (36.5 C), temperature source Oral, resp. rate 18, height 6' (1.829 m), weight 231 lb 11.3 oz (105.1 kg), SpO2 93.00%.  General: Well developed, well nourished, in no acute distress Head: Eyes PERRLA, No xanthomas.   Normal cephalic and atramatic  Lungs: Clear bilaterally to auscultation and percussion. Heart: Irregular without MRG.  Pulses are 2+ & equal.            No carotid bruit. No JVD.  No abdominal bruits. No femoral bruits. Abdomen: Bowel sounds are positive, abdomen soft and non-tender without masses or                  Hernia's noted. Msk:  Back  normal, normal gait. Normal strength and tone for age. Extremities: No clubbing, cyanosis or edema.  DP +1 Neuro: Alert and oriented X 3. Psych:  Good affect, responds appropriately  Labs:   Lab Results  Component Value Date   WBC 7.7 04/03/2011   HGB 13.8 04/03/2011   HCT 41.4 04/03/2011   MCV 93.9 04/03/2011   PLT 156 04/03/2011    Lab 04/03/11 0428  NA 137  K 3.9  CL 103  CO2 28  BUN 14  CREATININE 0.87  CALCIUM 9.6  PROT 6.5  BILITOT 0.2*  ALKPHOS 83  ALT 55*  AST 45*  GLUCOSE 146*     Radiology: Dg Chest Portable 1 View  04/01/2011  *RADIOLOGY REPORT*  Clinical Data: Short of breath.  Atrial fibrillation  PORTABLE CHEST - 1 VIEW  Comparison: 03/27/2011  Findings: Cardiac enlargement.  Negative for heart failure. Negative for pneumonia or effusion.  Underlying COPD.  IMPRESSION: COPD and cardiac enlargement.  No acute cardiopulmonary disease.  Original Report Authenticated By: Camelia Phenes, M.D.   [id number] fib/flutter at 123 bpm.  ASSESSMENT AND PLAN:   1. Paroxysmal atrial fib with RVR: Patient returns with an inadequate control of heart rate in atrial fibrillation.  There was no evidence of CHF on admission. The patient is feeling better with increase the metoprolol to 100 mg twice a day with continuation of Cardizem at preadmission dose. He remains in atrial fibrillation at rates between 90 and 100 beats per minute. Can consider institution of antiarrhythmic such as amiodarone or even Tikosyn.  2. Severe systolic dysfunction: He has not had a cardiac catheterization since 2001 when a 50% circumflex lesion was found. At that time and with followup echocardiograms he had a normal ejection fraction. Ejection fraction now is severely reduced at 25%. This is believed to be related to tachycardia cardiomyopathy along with medical noncompliance as he has been unable to afford his medications.  Discussion for AICD pacemaker is still an option. We'll add Lasix 40 mg by  mouth daily to assist in management of systolic heart failure. It is interesting that the patient has diuresis 3000 cc since admission being placed back on medications he was prescribed. He denies medical noncompliance however.  Bettey Mare. Lyman Bishop NP [facility] 04/03/2011, 8:38 AM   Cardiology Attending Patient interviewed and examined. Discussed with Joni Reining, NP.  Above note annotated and modified based upon my findings.  Mr. Peerson has done well with a rhythm control strategy and remote ablation of atrial flutter. Due to recent congestive heart failure, left ventricular dysfunction and coronary artery disease, he is not an ideal candidate for type IC agents.  Dofetilide is a consideration, but would require a three-day hospitalization at [facility] and compliance with laboratory testing, which is uncertain.  Moreover, in the setting of treatment for CHF, the risk of hypokalemia significant. Treatment with amiodarone is also a consideration, but also would require that the patient maintain adequate followup. For now, we will pursue a strategy of heart rate control and anticoagulation. If LV systolic function returns to normal, a stress test can be performed to rule out ischemia and treatment with a 1C agent reinitiated. If patient tolerates activity without an excessive increase in heart rate, consideration can be given to discharge today with close office followup.  Aspirin will be discontinued, as this increases the risk of hemorrhage. Spironolactone is not necessarily indicated if LV systolic function were versus following control of tachycardia.  Chronic use of Celebrex is not desirable in a patient who is anticoagulated. A statin should be adequate for control of hyperlipidemia, and Lovasa will be discontinued. Dose of lisinopril will be increased.  Magnesium oxide will be discontinued as well.  [place] Bing, MD 04/03/2011, 10:31 AM

## 2011-04-03 NOTE — Progress Notes (Signed)
UR Chart Review Completed  

## 2011-04-03 NOTE — Progress Notes (Signed)
ANTICOAGULATION CONSULT NOTE -   Pharmacy Consult for  Warfarin (continuation of home therapy) Indication: atrial fibrillation  No Known Allergies  Patient Measurements: Height: 6' (182.9 cm) Weight: 231 lb 11.3 oz (105.1 kg) IBW/kg (Calculated) : 77.6  Adjusted Body Weight: n/a  Vital Signs: Temp: 97.7 F (36.5 C) (12/17 0500) Temp src: Oral (12/17 0500) BP: 107/79 mmHg (12/17 0600) Pulse Rate: 70  (12/17 0600)  Labs:  Basename 04/03/11 0428 04/02/11 0520 04/01/11 2135 04/01/11 2125 03/31/11 1035  HGB 13.8 12.9* -- -- --  HCT 41.4 38.1* -- 41.2 --  PLT 156 141* -- 163 --  APTT -- -- -- -- --  LABPROT -- 42.0* 33.4* -- 24.7*  INR -- 4.32* 3.22* -- 2.15*  HEPARINUNFRC -- -- -- -- --  CREATININE 0.87 0.81 -- 0.86 --  CKTOTAL -- -- -- 63 --  CKMB -- -- -- 2.3 --  TROPONINI -- -- -- <0.30 --   Estimated Creatinine Clearance: 116 ml/min (by C-G formula based on Cr of 0.87).  Medical History: Past Medical History  Diagnosis Date  . Palpitation   . Atrial fibrillation     ventricular rate controlled  . Hypertension   . Hyperlipidemia   . Diabetes mellitus     Medications:  Scheduled:     . aspirin EC  81 mg Oral Daily  . buPROPion  200 mg Oral Daily  . celecoxib  200 mg Oral Daily  . diltiazem  240 mg Oral QHS  . escitalopram  20 mg Oral Daily  . glipiZIDE  7.5 mg Oral BID AC  . insulin aspart  0-5 Units Subcutaneous QHS  . insulin aspart  0-9 Units Subcutaneous TID WC  . lisinopril  5 mg Oral Daily  . magnesium oxide  400 mg Oral Daily  . metoprolol  100 mg Oral BID  . omega-3 acid ethyl esters  1 g Oral BID  . pantoprazole  40 mg Oral Daily  . potassium chloride      . rosuvastatin  20 mg Oral QHS  . spironolactone  25 mg Oral Daily    Assessment: Supra-therapeutic INR. Possible interaction with Celebrex. Goal of Therapy:  INR 2-3   Plan:  Daily INR Recently educated. No Warfarin today.  Gilman Buttner, [place] J 04/03/2011,8:33 AM

## 2011-04-04 LAB — BASIC METABOLIC PANEL
BUN: 14 mg/dL (ref 6–23)
CO2: 27 mEq/L (ref 19–32)
Calcium: 9.8 mg/dL (ref 8.4–10.5)
Chloride: 105 mEq/L (ref 96–112)
Creatinine, Ser: 0.77 mg/dL (ref 0.50–1.35)
GFR calc Af Amer: >90 mL/min (ref >90)
GFR calc non Af Amer: >90 mL/min (ref >90)
Glucose, Bld: 147 mg/dL — ABNORMAL HIGH (ref 70–99)
Potassium: 3.9 mEq/L (ref 3.5–5.1)
Sodium: 141 mEq/L (ref 135–145)

## 2011-04-04 LAB — PROTIME-INR
INR: 1.74 — ABNORMAL HIGH (ref 0.00–1.49)
Prothrombin Time: 20.7 seconds — ABNORMAL HIGH (ref 11.6–15.2)

## 2011-04-04 LAB — GLUCOSE, CAPILLARY: Glucose-Capillary: 140 mg/dL — ABNORMAL HIGH (ref 70–99)

## 2011-04-04 LAB — PRO B NATRIURETIC PEPTIDE: Pro B Natriuretic peptide (BNP): 1179 pg/mL — ABNORMAL HIGH (ref 0–125)

## 2011-04-04 MED ORDER — METOPROLOL TARTRATE 100 MG PO TABS: 100.0000 mg | ORAL_TABLET | Freq: Two times a day (BID) | ORAL | Status: DC

## 2011-04-04 MED ORDER — LISINOPRIL 10 MG PO TABS: 10.0000 mg | ORAL_TABLET | Freq: Every day | ORAL | Status: DC

## 2011-04-04 MED ORDER — GLIPIZIDE 5 MG PO TABS: 7.5000 mg | ORAL_TABLET | Freq: Two times a day (BID) | ORAL | Status: DC

## 2011-04-04 MED ORDER — FUROSEMIDE 40 MG PO TABS: 40.0000 mg | ORAL_TABLET | Freq: Every day | ORAL | Status: DC

## 2011-04-04 MED ORDER — WARFARIN SODIUM 7.5 MG PO TABS: 7.5000 mg | ORAL_TABLET | Freq: Once | ORAL | Status: DC

## 2011-04-04 NOTE — Progress Notes (Signed)
ANTICOAGULATION CONSULT NOTE -   Pharmacy Consult for  Warfarin (continuation of home therapy) Indication: atrial fibrillation  No Known Allergies  Patient Measurements: Height: 6' (182.9 cm) Weight: 224 lb 10.4 oz (101.9 kg) IBW/kg (Calculated) : 77.6  Adjusted Body Weight: n/a  Vital Signs: Temp: 97.4 F (36.3 C) (12/18 0537) Temp src: Oral (12/18 0537) BP: 115/79 mmHg (12/18 0537) Pulse Rate: 94  (12/18 0537)  Labs:  Alvira Philips 04/04/11 0904 04/04/11 0448 04/03/11 0428 04/02/11 0520 04/01/11 2135 04/01/11 2125  HGB -- -- 13.8 12.9* -- --  HCT -- -- 41.4 38.1* -- 41.2  PLT -- -- 156 141* -- 163  APTT -- -- -- -- -- --  LABPROT 20.7* -- -- 42.0* 33.4* --  INR 1.74* -- -- 4.32* 3.22* --  HEPARINUNFRC -- -- -- -- -- --  CREATININE -- 0.77 0.87 0.81 -- --  CKTOTAL -- -- -- -- -- 63  CKMB -- -- -- -- -- 2.3  TROPONINI -- -- -- -- -- <0.30   Estimated Creatinine Clearance: 124.3 ml/min (by C-G formula based on Cr of 0.77).  Medical History: Past Medical History  Diagnosis Date  . Palpitation   . Atrial fibrillation     ventricular rate controlled  . Hypertension   . Hyperlipidemia   . Diabetes mellitus    Medications:  Scheduled:     . buPROPion  200 mg Oral Daily  . celecoxib  200 mg Oral Daily  . diltiazem  240 mg Oral QHS  . escitalopram  20 mg Oral Daily  . furosemide  40 mg Oral Daily  . glipiZIDE  7.5 mg Oral BID AC  . lisinopril  10 mg Oral Daily  . metoprolol  100 mg Oral BID  . pantoprazole  40 mg Oral Daily  . rosuvastatin  20 mg Oral QHS  . warfarin  7.5 mg Oral ONCE-1800  . DISCONTD: aspirin EC  81 mg Oral Daily  . DISCONTD: insulin aspart  0-5 Units Subcutaneous QHS  . DISCONTD: insulin aspart  0-9 Units Subcutaneous TID WC  . DISCONTD: lisinopril  5 mg Oral Daily  . DISCONTD: magnesium oxide  400 mg Oral Daily  . DISCONTD: omega-3 acid ethyl esters  1 g Oral BID  . DISCONTD: spironolactone  25 mg Oral Daily   Assessment: INR dropped down  below goal today  Goal of Therapy:  INR 2-3   Plan: Coumadin 7.5mg  today x 1 INR daily until stable.  Margo Aye, Yumiko Alkins A 04/04/2011,9:36 AM

## 2011-04-04 NOTE — Progress Notes (Addendum)
SUBJECTIVE:Wants to go home.  Active Problems:  DIABETES MELLITUS, TYPE II  HYPERLIPIDEMIA  GASTROESOPHAGEAL REFLUX DISEASE  DOE (dyspnea on exertion)  Atrial fibrillation with rapid ventricular response  Chronic systolic heart failure   LABS: Basic Metabolic Panel:  Basename 04/04/11 0448 04/03/11 0428 04/01/11 2125  NA 141 137 --  K 3.9 3.9 --  CL 105 103 --  CO2 27 28 --  GLUCOSE 147* 146* --  BUN 14 14 --  CREATININE 0.77 0.87 --  CALCIUM 9.8 9.6 --  MG -- -- 1.9  PHOS -- -- --   Liver Function Tests:  [facility] 04/03/11 0428 04/01/11 2125  AST 45* 34  ALT 55* 48  ALKPHOS 83 87  BILITOT 0.2* 0.3  PROT 6.5 6.9  ALBUMIN 3.6 4.0   CBC:  Basename 04/03/11 0428 04/02/11 0520  WBC 7.7 6.3  NEUTROABS -- --  HGB 13.8 12.9*  HCT 41.4 38.1*  MCV 93.9 94.1  PLT 156 141*   Cardiac Enzymes:  Basename 04/01/11 2125  CKTOTAL 63  CKMB 2.3  CKMBINDEX --  TROPONINI <0.30    PHYSICAL EXAM BP 115/79  Pulse 94  Temp(Src) 97.4 F (36.3 C) (Oral)  Resp 19  Ht 6' (1.829 m)  Wt 224 lb 10.4 oz (101.9 kg)  BMI 30.47 kg/m2  SpO2 96% General: Well developed, well nourished, in no acute distress Head: Eyes PERRLA, No xanthomas.   Normal cephalic and atramatic  Lungs: Clear bilaterally to auscultation and percussion. Heart: Irregular rhythm, No MRG .  Pulses are 2+ & equal.  No carotid bruit. No JVD.  No abdominal bruits. No femoral bruits. Abdomen: Bowel sounds are positive, abdomen soft and non-tender without masses or                  Hernia's noted. Msk:  Back normal, normal gait. Normal strength and tone for age. Extremities: No clubbing, cyanosis or edema.  DP +1 Neuro: Alert and oriented X 3. Psych:  Good affect, responds appropriately  TELEMETRY: Reviewed telemetry pt in: Atrial Fib.  ASSESSMENT AND PLAN:  1. Atrial fibrillation with RVR:  He is better with increased dose of metoprolol of 100 mg BID.  He is being considered for antiarrythmic on follow-up  if he has more episodes of rapid heart rate. Rythmol was discontinued secondary to EF of 25%-30%. Possible addition of amiodarone or Tikosyn has been discussed. He has follow-up appointment with Dr. [name] on [name] 6th. I have advised him that if he again goes into a rapid rate that is symptomatic, he should present to [facility] hospital.  He may be then seen by EP physician with institution of amiodarone or other antiarrythmic at that time. Freida Busman will continue on current medications.  2. Severe systolic dysfunction: Consideration for cardiac catheterization should be revisited on follow-up appointment as this is a new finding on recent echo. He has known CAD with 50% circumflex lesion in 2001.Flecanide would not be an option for rate control in this patient in this setting. He will be a candidate for an AICD if LV dysfunction persists for a number of months. He is to see Dr. [name] in 2 weeks. Follow up appointments are made.  Bettey Mare. Lyman Bishop NP [facility] 04/04/2011, 9:50 AM   Cardiology Attending Patient interviewed and examined. Discussed with Joni Reining, NP.  Above note annotated and modified based upon my findings.  My plan is to determine if LV dysfunction resolves with adequate control of heart rate.  If  so, and there is no anatomic substrate for ischemia, continuing treatment with a 1C agent can be considered. Moreover, it will be possible to determine patient's compliance with his medical regime, which will favor some treatments over others.  Tome Bing, MD 04/04/2011, 5:28 PM

## 2011-04-04 NOTE — Progress Notes (Signed)
Pt discharge instructions and medications reviewed with pt. Pt discharged home at this time.

## 2011-04-04 NOTE — Discharge Summary (Signed)
Physician Discharge Summary  Patient ID: [name] Cook MRN: [id number] DOB/AGE: 1951/01/14 60 y.o.  Admit date: 04/01/2011 Discharge date: 04/04/2011    Discharge Diagnoses:  1. Atrial fibrillation with rapid ventricular response, better control. 2. Chronic systolic heart failure secondary to cardiomyopathy, currently compensated. Ejection fraction 25-30%. 3. Type 2 diabetes mellitus. 4. Hypertension. 5. Hyperlipidemia. 6. Ongoing tobacco abuse.   Current Discharge Medication List    START taking these medications   Details  furosemide (LASIX) 40 MG tablet Take 1 tablet (40 mg total) by mouth daily. Qty: 30 tablet, Refills: 0      CONTINUE these medications which have CHANGED   Details  glipiZIDE (GLUCOTROL) 5 MG tablet Take 1.5 tablets (7.5 mg total) by mouth 2 (two) times daily before a meal. Qty: 30 tablet, Refills: 0    lisinopril (PRINIVIL,ZESTRIL) 10 MG tablet Take 1 tablet (10 mg total) by mouth daily. Qty: 30 tablet, Refills: 0    metoprolol (LOPRESSOR) 100 MG tablet Take 1 tablet (100 mg total) by mouth 2 (two) times daily. Qty: 60 tablet, Refills: 0      CONTINUE these medications which have NOT CHANGED   Details  aspirin EC 81 MG tablet Take 81 mg by mouth every morning.      buPROPion (WELLBUTRIN SR) 100 MG 12 hr tablet Take 200 mg by mouth daily.      celecoxib (CELEBREX) 200 MG capsule Take 200 mg by mouth daily.      diltiazem (CARDIZEM CD) 240 MG 24 hr capsule Take 1 capsule (240 mg total) by mouth daily. Qty: 30 capsule, Refills: 0    escitalopram (LEXAPRO) 20 MG tablet Take 1 tablet (20 mg total) by mouth daily. Qty: 30 tablet, Refills: 1    fish oil-omega-3 fatty acids 1000 MG capsule Take 1 g by mouth daily.      magnesium oxide (MAG-OX) 400 MG tablet Take 1 tablet (400 mg total) by mouth daily. Qty: 30 tablet, Refills: 6    pantoprazole (PROTONIX) 40 MG tablet Take 40 mg by mouth daily.      rosuvastatin (CRESTOR) 20 MG tablet Take 20 mg  by mouth at bedtime.     warfarin (COUMADIN) 7.5 MG tablet Take 1 tablet (7.5 mg total) by mouth daily. Qty: 30 tablet, Refills: 0    Blood Pressure Monitoring (ADULT BLOOD PRESSURE CUFF LG) KIT 1 each by Does not apply route once. Qty: 1 each, Refills: 0      STOP taking these medications     spironolactone (ALDACTONE) 25 MG tablet         Discharged Condition: Stable.    Consults: Cardiology, Dr. [name].  Significant Diagnostic Studies: Dg Chest 2 View  03/27/2011  *RADIOLOGY REPORT*  Clinical Data: Shortness of breath which increases when lying flat, history smoking, hypertension, diabetes  CHEST - 2 VIEW  Comparison: 09/10/2010  Findings: Enlargement of cardiac silhouette. Calcified tortuous aorta. Pulmonary vascular congestion. Emphysematous and minimal bronchitic changes. Accentuation of perihilar interstitial markings, extending into bases, appears chronic. Minimal atelectasis or developing infiltrate left base with blunting of costophrenic angle posteriorly. Remaining lungs free of acute infiltrate. No pneumothorax. No acute osseous findings.  IMPRESSION: Emphysematous and bronchitic changes with chronic accentuation of interstitial markings. Minimal atelectasis or subtle infiltrate at left base with suspect tiny associated left pleural effusion.  Original Report Authenticated By: Lollie Marrow, M.D.   Dg Chest Portable 1 View  04/01/2011  *RADIOLOGY REPORT*  Clinical Data: Short of breath.  Atrial fibrillation  PORTABLE CHEST - 1 VIEW  Comparison: 03/27/2011  Findings: Cardiac enlargement.  Negative for heart failure. Negative for pneumonia or effusion.  Underlying COPD.  IMPRESSION: COPD and cardiac enlargement.  No acute cardiopulmonary disease.  Original Report Authenticated By: Camelia Phenes, M.D.    Lab Results: Basic Metabolic Panel:  Basename 04/04/11 0448 04/03/11 0428 04/01/11 2125  NA 141 137 --  K 3.9 3.9 --  CL 105 103 --  CO2 27 28 --  GLUCOSE 147*  146* --  BUN 14 14 --  CREATININE 0.77 0.87 --  CALCIUM 9.8 9.6 --  MG -- -- 1.9  PHOS -- -- --   Liver Function Tests:  [facility] 04/03/11 0428 04/01/11 2125  AST 45* 34  ALT 55* 48  ALKPHOS 83 87  BILITOT 0.2* 0.3  PROT 6.5 6.9  ALBUMIN 3.6 4.0     CBC:  Basename 04/03/11 0428 04/02/11 0520  WBC 7.7 6.3  NEUTROABS -- --  HGB 13.8 12.9*  HCT 41.4 38.1*  MCV 93.9 94.1  PLT 156 141*    Recent Results (from the past 240 hour(s))  MRSA PCR SCREENING     Status: Normal   Collection Time   03/28/11  1:38 AM      Component Value Range Status Comment   MRSA by PCR NEGATIVE  NEGATIVE  Final   MRSA PCR SCREENING     Status: Abnormal   Collection Time   04/02/11 12:32 AM      Component Value Range Status Comment   MRSA by PCR INVALID RESULTS, SPECIMEN SENT FOR CULTURE (*) NEGATIVE  Final      Hospital Course: This 60 year old man presented again with rapid irregular palpitations consistent with atrial fibrillation with rapid ventricular response. He had been discharged only a few days prior to hospitalization. He was therefore been to the hospital again, serial cardiac enzymes were negative and he was in the intensive care unit initially. His ventricular rate was somewhat stabilized with adjustments of medications. He has been reviewed again by cardiology who feel that current medications are appropriate but other, antiarrhythmic medications may be considered in the future. He has been instructed to present to [facility] if he should get symptoms again as this may be more appropriate for his particular problems. He has mobilize somewhat in the room and feels somewhat better than he did when he was admitted to the hospital.  Discharge Exam: Blood pressure 115/79, pulse 94, temperature 97.4 F (36.3 C), temperature source Oral, resp. rate 19, height 6' (1.829 m), weight 101.9 kg (224 lb 10.4 oz), SpO2 96.00%. He looks systemically well. Heart sounds are present and in  atrial fibrillation/irregular. Lung fields are clinically clear with a few scattered wheezing areas. Abdomen is soft and nontender. He is alert and orientated without any focal neurological signs.  Disposition: Home. He will have close followup with cardiology.  Discharge Orders    Future Appointments: Provider: Department: Dept Phone: Center:   04/06/2011 10:40 AM Louanna Raw, RN [facility] [phone] [facility]   04/20/2011 2:45 PM [name]. [name], MD [facility] [phone] [id number]     Future Orders Please Complete By Expires   Diet - low sodium heart healthy      Increase activity slowly         Follow-up Information    Follow up with [facility] on 04/06/2011. (10:20 am)          Signed: Wilson Singer Pager [phone]  04/04/2011, 9:58 AM

## 2011-04-05 LAB — GLUCOSE, CAPILLARY: Glucose-Capillary: 146 mg/dL — ABNORMAL HIGH (ref 70–99)

## 2011-04-06 ENCOUNTER — Ambulatory Visit (INDEPENDENT_AMBULATORY_CARE_PROVIDER_SITE_OTHER): Payer: Medicaid Other | Admitting: *Deleted

## 2011-04-06 DIAGNOSIS — Z7901 Long term (current) use of anticoagulants: Secondary | ICD-10-CM

## 2011-04-06 DIAGNOSIS — I4891 Unspecified atrial fibrillation: Secondary | ICD-10-CM

## 2011-04-06 LAB — POCT INR: INR: 1.2

## 2011-04-12 ENCOUNTER — Encounter: Payer: Medicaid Other | Admitting: *Deleted

## 2011-04-20 ENCOUNTER — Encounter: Payer: Self-pay | Admitting: Cardiology

## 2011-04-20 ENCOUNTER — Ambulatory Visit (INDEPENDENT_AMBULATORY_CARE_PROVIDER_SITE_OTHER): Payer: Medicaid Other | Admitting: *Deleted

## 2011-04-20 ENCOUNTER — Ambulatory Visit (INDEPENDENT_AMBULATORY_CARE_PROVIDER_SITE_OTHER): Payer: Medicaid Other | Admitting: Cardiology

## 2011-04-20 DIAGNOSIS — E1165 Type 2 diabetes mellitus with hyperglycemia: Secondary | ICD-10-CM | POA: Insufficient documentation

## 2011-04-20 DIAGNOSIS — Z720 Tobacco use: Secondary | ICD-10-CM | POA: Insufficient documentation

## 2011-04-20 DIAGNOSIS — I4891 Unspecified atrial fibrillation: Secondary | ICD-10-CM

## 2011-04-20 DIAGNOSIS — E119 Type 2 diabetes mellitus without complications: Secondary | ICD-10-CM | POA: Insufficient documentation

## 2011-04-20 DIAGNOSIS — I4892 Unspecified atrial flutter: Secondary | ICD-10-CM | POA: Insufficient documentation

## 2011-04-20 DIAGNOSIS — Z7901 Long term (current) use of anticoagulants: Secondary | ICD-10-CM

## 2011-04-20 DIAGNOSIS — I10 Essential (primary) hypertension: Secondary | ICD-10-CM | POA: Insufficient documentation

## 2011-04-20 DIAGNOSIS — J449 Chronic obstructive pulmonary disease, unspecified: Secondary | ICD-10-CM | POA: Insufficient documentation

## 2011-04-20 DIAGNOSIS — E785 Hyperlipidemia, unspecified: Secondary | ICD-10-CM | POA: Insufficient documentation

## 2011-04-20 DIAGNOSIS — I4819 Other persistent atrial fibrillation: Secondary | ICD-10-CM | POA: Insufficient documentation

## 2011-04-20 DIAGNOSIS — I480 Paroxysmal atrial fibrillation: Secondary | ICD-10-CM

## 2011-04-20 HISTORY — DX: Chronic obstructive pulmonary disease, unspecified: J44.9

## 2011-04-20 LAB — POCT INR: INR: 7.1

## 2011-04-20 MED ORDER — WARFARIN SODIUM 7.5 MG PO TABS: 7.5000 mg | ORAL_TABLET | Freq: Every day | ORAL | Status: DC

## 2011-04-20 MED ORDER — DILTIAZEM HCL ER COATED BEADS 240 MG PO CP24: 240.0000 mg | ORAL_CAPSULE | Freq: Every day | ORAL | Status: DC

## 2011-04-20 MED ORDER — FUROSEMIDE 40 MG PO TABS: 40.0000 mg | ORAL_TABLET | Freq: Every day | ORAL | Status: DC

## 2011-04-20 MED ORDER — DIGOXIN 250 MCG PO TABS: 250.0000 ug | ORAL_TABLET | Freq: Every day | ORAL | Status: DC

## 2011-04-20 MED ORDER — LISINOPRIL 10 MG PO TABS: 10.0000 mg | ORAL_TABLET | Freq: Every day | ORAL | Status: DC

## 2011-04-20 MED ORDER — METOPROLOL SUCCINATE ER 50 MG PO TB24: 50.0000 mg | ORAL_TABLET | Freq: Every day | ORAL | Status: DC

## 2011-04-20 MED ORDER — FLUTICASONE-SALMETEROL 250-50 MCG/DOSE IN AEPB: 2.0000 | INHALATION_SPRAY | Freq: Two times a day (BID) | RESPIRATORY_TRACT | Status: DC

## 2011-04-20 NOTE — Progress Notes (Signed)
Patient ID: [name] Cook, male   DOB: 1950-05-03, 61 y.o.   MRN: [id number] HPI: Return visit following 2 recent hospital admissions for atrial fibrillation with rapid ventricular response.   He continues to experience malaise, fatigue and exertional dyspnea.  He has had no chest discomfort.  He does not note palpitations, lightheadedness nor syncope.  He has had long-standing chronic lung disease related to tobacco use, but has not been treated with Procrit medication.  He previously received care from the [facility], but now has Medicaid coverage.  He has noted painless hematuria during the past few days describing urine as a pale red color with no clots.  He has no symptoms to suggest urinary retention..  Prior to Admission medications   Medication Sig Start Date End Date Taking? Authorizing Provider  aspirin EC 81 MG tablet Take 81 mg by mouth every morning.     Yes Historical Provider, MD  Blood Pressure Monitoring (ADULT BLOOD PRESSURE CUFF LG) KIT 1 each by Does not apply route once. 03/31/11  Yes [name]. Maveryck Bahri, MD  buPROPion (WELLBUTRIN SR) 100 MG 12 hr tablet Take 200 mg by mouth daily.     Yes Historical Provider, MD  celecoxib (CELEBREX) 200 MG capsule Take 200 mg by mouth daily.     Yes Historical Provider, MD  diltiazem (CARDIZEM CD) 240 MG 24 hr capsule Take 1 capsule (240 mg total) by mouth daily. 03/30/11 03/29/12 Yes Nimish C Gosrani  escitalopram (LEXAPRO) 20 MG tablet Take 1 tablet (20 mg total) by mouth daily. 10/27/10  Yes [name]. Herta Hink, MD  fish oil-omega-3 fatty acids 1000 MG capsule Take 1 g by mouth daily.     Yes Historical Provider, MD  furosemide (LASIX) 40 MG tablet Take 1 tablet (40 mg total) by mouth daily. 04/04/11 04/03/12 Yes Nimish C Gosrani  glipiZIDE (GLUCOTROL) 5 MG tablet Take 1.5 tablets (7.5 mg total) by mouth 2 (two) times daily before a meal. 04/04/11 04/03/12 Yes Nimish C Gosrani  lisinopril (PRINIVIL,ZESTRIL) 10 MG tablet Take 1 tablet  (10 mg total) by mouth daily. 04/04/11 04/03/12 Yes Nimish C Gosrani  magnesium oxide (MAG-OX) 400 MG tablet Take 1 tablet (400 mg total) by mouth daily. 11/09/10  Yes Joni Reining, NP  pantoprazole (PROTONIX) 40 MG tablet Take 40 mg by mouth daily.     Yes Historical Provider, MD  rosuvastatin (CRESTOR) 20 MG tablet Take 20 mg by mouth at bedtime.    Yes Historical Provider, MD  warfarin (COUMADIN) 7.5 MG tablet Take 1 tablet (7.5 mg total) by mouth daily. 03/30/11 03/29/12 Yes Nimish C Gosrani    No Known Allergies    Past medical history, social history, and family history reviewed and updated.  ROS: See history of present illness  PHYSICAL EXAM: BP 102/72  Pulse 73  Ht 6' (1.829 m)  Wt 104.327 kg (230 lb)  BMI 31.19 kg/m2   General-Well developed; no acute distress Body habitus-overweight Neck-No JVD; no carotid bruits Lungs-history next bronchitis, expiratory wheezing, prolonged expiratory phase; resonant to percussion Cardiovascular-normal PMI; distant S1 and S2; irregular rhythm Abdomen-normal bowel sounds; soft and non-tender without masses or organomegaly Musculoskeletal-No deformities, no cyanosis or clubbing Neurologic-Normal cranial nerves; symmetric strength and tone Skin-Warm, no significant lesions Extremities-distal pulses intact; no edema  EKG: Atrial fibrillation/atypical flutter with a slightly rapid ventricular response of 104 bpm; nonspecific ST-T wave abnormality.  No previous tracing for comparison.  ASSESSMENT AND PLAN:  [place] Bing, MD 04/20/2011 4:39 PM

## 2011-04-20 NOTE — Patient Instructions (Addendum)
**Note De-Identified [place] Obfuscation** Your physician has recommended you make the following change in your medication: Switch Metoprolol Succinate to Metoprolol Tartrate 50mg  daily, start using Advair Diskus 2 puffs twice daily with spacer and start taking Digoxin 0.25 mg (on day ONE take 1 tablet 4 times that day, then decrease to 1 tablet daily thereafter.  Your physician recommends that you schedule a follow-up appointment in: 2 weeks

## 2011-04-21 ENCOUNTER — Telehealth: Payer: Self-pay | Admitting: Cardiology

## 2011-04-21 NOTE — Telephone Encounter (Signed)
All medications were faxed in yesterday and Lorin Picket, pharmacist at [facility]. states that they did receive refill requests, pt. advised./LV

## 2011-04-21 NOTE — Telephone Encounter (Signed)
Patient states that he did not receive all of his refills from [place].  Please call to pharmacy to verify that they were received. / tg

## 2011-04-23 NOTE — Assessment & Plan Note (Signed)
Heart rate control is fairly good, but high-dose beta blocker may be exacerbating chronic lung disease.  Treatment with digoxin will be initiated with a total of 1 mg on day one followed by 0.25 mg per day.  Metoprolol dosage will be reduced to 25 mg twice a day and heart rate control carefully followed. 

## 2011-04-23 NOTE — Assessment & Plan Note (Signed)
Blood pressure control is excellent at present.  I doubt that control will be lost by tapering dose of metoprolol.

## 2011-04-23 NOTE — Assessment & Plan Note (Deleted)
Heart rate control is fairly good, but high-dose beta blocker may be exacerbating chronic lung disease.  Treatment with digoxin will be initiated with a total of 1 mg on day one followed by 0.25 mg per day.  Metoprolol dosage will be reduced to 25 mg twice a day and heart rate control carefully followed.

## 2011-04-23 NOTE — Assessment & Plan Note (Signed)
Active bronchospasm with symptoms.  Advair will be added to patient's regimen.  Health Department verifies that this medication can be supplied to them and no cost.  Dose of metoprolol will be decreased substantially and it may be necessary to discontinue this drug entirely.

## 2011-04-23 NOTE — Assessment & Plan Note (Signed)
Importance of not missing anticoagulation clinic visits was emphasized to the patient.  Dosage of warfarin will be readjusted.

## 2011-04-24 ENCOUNTER — Ambulatory Visit (INDEPENDENT_AMBULATORY_CARE_PROVIDER_SITE_OTHER): Payer: Medicaid Other | Admitting: *Deleted

## 2011-04-24 ENCOUNTER — Telehealth: Payer: Self-pay | Admitting: *Deleted

## 2011-04-24 ENCOUNTER — Encounter: Payer: Medicaid Other | Admitting: *Deleted

## 2011-04-24 DIAGNOSIS — Z7901 Long term (current) use of anticoagulants: Secondary | ICD-10-CM

## 2011-04-24 DIAGNOSIS — I4891 Unspecified atrial fibrillation: Secondary | ICD-10-CM

## 2011-04-24 LAB — POCT INR: INR: 1.8

## 2011-04-24 NOTE — Telephone Encounter (Signed)
Preauthorized Advair Diskus 250/50 to start today and will be in effect until 04/23/2013.  Pre auth phone number [phone].

## 2011-04-27 ENCOUNTER — Ambulatory Visit (INDEPENDENT_AMBULATORY_CARE_PROVIDER_SITE_OTHER): Payer: Medicaid Other | Admitting: *Deleted

## 2011-04-27 DIAGNOSIS — I4891 Unspecified atrial fibrillation: Secondary | ICD-10-CM

## 2011-04-27 DIAGNOSIS — Z7901 Long term (current) use of anticoagulants: Secondary | ICD-10-CM

## 2011-04-27 LAB — POCT INR: INR: 3.1

## 2011-05-05 ENCOUNTER — Ambulatory Visit (INDEPENDENT_AMBULATORY_CARE_PROVIDER_SITE_OTHER): Payer: Medicaid Other | Admitting: Cardiology

## 2011-05-05 ENCOUNTER — Encounter: Payer: Medicaid Other | Admitting: *Deleted

## 2011-05-05 ENCOUNTER — Encounter: Payer: Self-pay | Admitting: Cardiology

## 2011-05-05 ENCOUNTER — Ambulatory Visit (INDEPENDENT_AMBULATORY_CARE_PROVIDER_SITE_OTHER): Payer: Medicaid Other | Admitting: *Deleted

## 2011-05-05 VITALS — BP 97/62 | HR 83 | Ht 72.0 in | Wt 230.0 lb

## 2011-05-05 DIAGNOSIS — I4892 Unspecified atrial flutter: Secondary | ICD-10-CM

## 2011-05-05 DIAGNOSIS — Z7901 Long term (current) use of anticoagulants: Secondary | ICD-10-CM

## 2011-05-05 DIAGNOSIS — I480 Paroxysmal atrial fibrillation: Secondary | ICD-10-CM

## 2011-05-05 DIAGNOSIS — I4891 Unspecified atrial fibrillation: Secondary | ICD-10-CM

## 2011-05-05 DIAGNOSIS — I10 Essential (primary) hypertension: Secondary | ICD-10-CM

## 2011-05-05 LAB — POCT INR: INR: 2.5

## 2011-05-05 MED ORDER — FUROSEMIDE 20 MG PO TABS: 20.0000 mg | ORAL_TABLET | Freq: Every day | ORAL | Status: DC

## 2011-05-05 MED ORDER — DILTIAZEM HCL ER BEADS 360 MG PO CP24: 360.0000 mg | ORAL_CAPSULE | Freq: Every day | ORAL | Status: DC

## 2011-05-05 NOTE — Assessment & Plan Note (Addendum)
Blood pressure control is a little too good.  Unfortunately, he has developed symptomatic hypotension as a result of treatment with medications to control heart rate.  We will discontinue metoprolol while increasing the dose of diltiazem and decrease in his dose of furosemide.  I will continue to see him frequently until adjustment of medications for control of heart rate is adequate.  At that point, he will likely require DC cardioversion and treatment with antiarrhythmic medication.  The choice of that medication will depend partly upon the results of a repeat echocardiogram with reassessment of left ventricular systolic function.

## 2011-05-05 NOTE — Patient Instructions (Addendum)
Your physician recommends that you schedule a follow-up appointment in: 2 weeks  Your physician has recommended you make the following change in your medication:  STOP Metoprolol DECREASE Lasix (Furosemide) to 20 mg daily INCREASE Diltiazem to 360 mg daily

## 2011-05-05 NOTE — Assessment & Plan Note (Signed)
The patient is doing well with anticoagulation.  He may not require this long-term, but it will be maintained for now, as cardioversion is anticipated.  CBCs and stool Hemoccults will be monitored to exclude occult GI blood loss.

## 2011-05-05 NOTE — Progress Notes (Signed)
Patient ID: [name] Cook, male   DOB: 04-28-1950, 61 y.o.   MRN: [id number] HPI: Scheduled return visit for this very nice gentleman with previously paroxysmal and now persistent atrial fibrillation.  He is status post RFA for atrial flutter in 2002.  He has had a number of episodes of atrial fibrillation over the past few years, but this has been the longest.  He reports dyspnea on mild exertion, malaise and generalized fatigue.  He notes some palpitations.  He has had no chest discomfort.  Over the past week or 2, he has experience lightheadedness, only when standing.  In one instance, he feared that he would lose consciousness.  Prior to Admission medications   Medication Sig Start Date End Date Taking? Authorizing Provider  aspirin EC 81 MG tablet Take 81 mg by mouth every morning.     Yes Historical Provider, MD  buPROPion (WELLBUTRIN SR) 100 MG 12 hr tablet Take 200 mg by mouth daily.    Yes Historical Provider, MD  celecoxib (CELEBREX) 200 MG capsule Take 200 mg by mouth daily.     Yes Historical Provider, MD  digoxin (LANOXIN) 0.25 MG tablet Take 250 mcg by mouth daily. 04/20/11 04/19/12 Yes [name]. Larrissa Stivers, MD  fish oil-omega-3 fatty acids 1000 MG capsule Take 1 g by mouth daily.     Yes Historical Provider, MD  Fluticasone-Salmeterol (ADVAIR) 250-50 MCG/DOSE AEPB Inhale 2 puffs into the lungs 2 (two) times daily. With spacer 04/20/11  Yes [name]. Abisai Coble, MD  lisinopril (PRINIVIL,ZESTRIL) 10 MG tablet Take 1 tablet (10 mg total) by mouth daily. 04/20/11 04/19/12 Yes [name]. Destaney Sarkis, MD  magnesium oxide (MAG-OX) 400 MG tablet Take 1 tablet (400 mg total) by mouth daily. 11/09/10  Yes Joni Reining, NP  pantoprazole (PROTONIX) 40 MG tablet Take 40 mg by mouth daily.     Yes Historical Provider, MD  rosuvastatin (CRESTOR) 20 MG tablet Take 20 mg by mouth at bedtime.    Yes Historical Provider, MD  sitaGLIPtan-metformin (JANUMET) 50-1000 MG per tablet Take 1 tablet by mouth 2 (two) times daily  with a meal.   Yes Historical Provider, MD  warfarin (COUMADIN) 7.5 MG tablet Take 7.5 mg by mouth daily. Currently taking 1/2 tab daily. 04/20/11 04/19/12 Yes [name]. Kalijah Westfall, MD  diltiazem (TIAZAC) 360 MG 24 hr capsule Take 1 capsule (360 mg total) by mouth daily. 05/05/11 05/04/12  [name]. Ira Dougher, MD  furosemide (LASIX) 20 MG tablet Take 1 tablet (20 mg total) by mouth daily. 05/05/11 05/04/12  [name]. [name], MD  No Known Allergies    Past medical history, social history, and family history reviewed and updated.  ROS: See history of present illness.  PHYSICAL EXAM: BP 97/62  Pulse 83  Ht 6' (1.829 m)  Wt 104.327 kg (230 lb)  BMI 31.19 kg/m2  General-Well developed; no acute distress Body habitus-overweight Neck-No JVD; no carotid bruits Lungs-clear lung fields; resonant to percussion; minimal expiratory wheeze; prolonged expiratory phase Cardiovascular-normal PMI; normal S1 and S2; irregular rhythm; modest systolic ejection murmur Abdomen-normal bowel sounds; soft and non-tender without masses or organomegaly Musculoskeletal-No deformities, no cyanosis or clubbing Neurologic-Normal cranial nerves; symmetric strength and tone Skin-Warm, no significant lesions Extremities-distal pulses intact; no edema  EKG: Atrial fibrillation with controlled ventricular response; heart rate of 77 bpm; ST-T wave abnormality consistent with digoxin effect.  No previous tracing for comparison.  ASSESSMENT AND PLAN:  [place] Bing, MD 05/05/2011 5:19 PM

## 2011-05-07 ENCOUNTER — Emergency Department (HOSPITAL_COMMUNITY)
Admission: EM | Admit: 2011-05-07 | Discharge: 2011-05-07 | Disposition: A | Discharge status: Home / Self Care | Payer: Medicaid Other | Attending: Emergency Medicine | Admitting: Emergency Medicine

## 2011-05-07 ENCOUNTER — Emergency Department (HOSPITAL_COMMUNITY): Payer: Medicaid Other

## 2011-05-07 ENCOUNTER — Other Ambulatory Visit: Payer: Self-pay

## 2011-05-07 ENCOUNTER — Encounter (HOSPITAL_COMMUNITY): Payer: Self-pay | Admitting: *Deleted

## 2011-05-07 DIAGNOSIS — I10 Essential (primary) hypertension: Secondary | ICD-10-CM | POA: Insufficient documentation

## 2011-05-07 DIAGNOSIS — Z7901 Long term (current) use of anticoagulants: Secondary | ICD-10-CM | POA: Insufficient documentation

## 2011-05-07 DIAGNOSIS — R5381 Other malaise: Secondary | ICD-10-CM | POA: Insufficient documentation

## 2011-05-07 DIAGNOSIS — J4489 Other specified chronic obstructive pulmonary disease: Secondary | ICD-10-CM | POA: Insufficient documentation

## 2011-05-07 DIAGNOSIS — I4891 Unspecified atrial fibrillation: Secondary | ICD-10-CM | POA: Insufficient documentation

## 2011-05-07 DIAGNOSIS — R531 Weakness: Secondary | ICD-10-CM

## 2011-05-07 DIAGNOSIS — R0602 Shortness of breath: Secondary | ICD-10-CM | POA: Insufficient documentation

## 2011-05-07 DIAGNOSIS — E785 Hyperlipidemia, unspecified: Secondary | ICD-10-CM | POA: Insufficient documentation

## 2011-05-07 DIAGNOSIS — Z87891 Personal history of nicotine dependence: Secondary | ICD-10-CM | POA: Insufficient documentation

## 2011-05-07 DIAGNOSIS — Z7982 Long term (current) use of aspirin: Secondary | ICD-10-CM | POA: Insufficient documentation

## 2011-05-07 DIAGNOSIS — R5383 Other fatigue: Secondary | ICD-10-CM | POA: Insufficient documentation

## 2011-05-07 DIAGNOSIS — J449 Chronic obstructive pulmonary disease, unspecified: Secondary | ICD-10-CM | POA: Insufficient documentation

## 2011-05-07 DIAGNOSIS — E119 Type 2 diabetes mellitus without complications: Secondary | ICD-10-CM | POA: Insufficient documentation

## 2011-05-07 LAB — COMPREHENSIVE METABOLIC PANEL
ALT: 45 U/L (ref 0–53)
AST: 36 U/L (ref 0–37)
Albumin: 4.1 g/dL (ref 3.5–5.2)
Alkaline Phosphatase: 86 U/L (ref 39–117)
BUN: 15 mg/dL (ref 6–23)
CO2: 29 mEq/L (ref 19–32)
Calcium: 10.5 mg/dL (ref 8.4–10.5)
Chloride: 101 mEq/L (ref 96–112)
Creatinine, Ser: 0.84 mg/dL (ref 0.50–1.35)
GFR calc Af Amer: >90 mL/min (ref >90)
GFR calc non Af Amer: >90 mL/min (ref >90)
Glucose, Bld: 106 mg/dL — ABNORMAL HIGH (ref 70–99)
Potassium: 3.8 mEq/L (ref 3.5–5.1)
Sodium: 140 mEq/L (ref 135–145)
Total Bilirubin: 0.5 mg/dL (ref 0.3–1.2)
Total Protein: 7.1 g/dL (ref 6.0–8.3)

## 2011-05-07 LAB — PROTIME-INR
INR: 2.1 — ABNORMAL HIGH (ref 0.00–1.49)
Prothrombin Time: 23.9 seconds — ABNORMAL HIGH (ref 11.6–15.2)

## 2011-05-07 LAB — DIFFERENTIAL
Basophils Absolute: 0.1 10*3/uL (ref 0.0–0.1)
Basophils Relative: 1 % (ref 0–1)
Eosinophils Absolute: 0.3 10*3/uL (ref 0.0–0.7)
Eosinophils Relative: 4 % (ref 0–5)
Lymphocytes Relative: 38 % (ref 12–46)
Lymphs Abs: 2.9 10*3/uL (ref 0.7–4.0)
Monocytes Absolute: 0.7 10*3/uL (ref 0.1–1.0)
Monocytes Relative: 9 % (ref 3–12)
Neutro Abs: 3.7 10*3/uL (ref 1.7–7.7)
Neutrophils Relative %: 48 % (ref 43–77)

## 2011-05-07 LAB — CBC
HCT: 41.4 % (ref 39.0–52.0)
Hemoglobin: 13.9 g/dL (ref 13.0–17.0)
MCH: 30.5 pg (ref 26.0–34.0)
MCHC: 33.6 g/dL (ref 30.0–36.0)
MCV: 91 fL (ref 78.0–100.0)
Platelets: 134 10*3/uL — ABNORMAL LOW (ref 150–400)
RBC: 4.55 MIL/uL (ref 4.22–5.81)
RDW: 13.5 % (ref 11.5–15.5)
WBC: 7.7 10*3/uL (ref 4.0–10.5)

## 2011-05-07 LAB — PRO B NATRIURETIC PEPTIDE: Pro B Natriuretic peptide (BNP): 628.7 pg/mL — ABNORMAL HIGH (ref 0–125)

## 2011-05-07 LAB — DIGOXIN LEVEL: Digoxin Level: 1.1 ng/mL (ref 0.8–2.0)

## 2011-05-07 LAB — POCT I-STAT TROPONIN I: Troponin i, poc: 0.01 ng/mL (ref 0.00–0.08)

## 2011-05-07 NOTE — ED Notes (Signed)
In to give D/C instructions.  Patient c/o pressure inside his head, " not really pain but feels like my head is going to explode" also c/o ringing in ears (both). This started about a week ago.  Did tell his MD and says MD did not say much about it.  Wants to know what is causing this.

## 2011-05-07 NOTE — ED Provider Notes (Signed)
History  This chart was scribed for [name] Horn, MD by [name] Cook. This patient was seen in room APA17/AP[facility]17 and the patient's care was started at 5:52PM.  CSN: [id number]  Arrival date & time 05/07/11  1640    Chief Complaint  Patient presents with  . Dizziness    The history is provided by the patient. No language interpreter was used.    [name] Cook is a 61 y.o. male who presents to the Emergency Department complaining of 2 days of gradual onset, non-changing dizziness and chronic unchanged from baseline difficulty breathing. Pt states that the dizziness has gotten worse. He has a h/o chronic A. Fib awiating cardio version. He has been rate controlled w/ beta blocker but developed wheezing so beta blocker was decreased and he was started calcium channel blocker. He developed symptomatic hypotension with light headedness so medications were adjusted 2 days ago but he still gets lightheaded with standing and walking. He has baseline occasional palpitations and SOB with activity which is unchanged. He denies any sudden changes in speech, vision, swallowing or understanding. He denies any localized weakness, numbness or changes in coordination. He denies smoking and alcohol use.  Denies vertigo.   Past Medical History  Diagnosis Date  . Paroxysmal atrial fibrillation     onset in 2009; cardiac cath in 2001-50% circumflex; recurred 04/2010, 03/2011; ventricular rate controlled; normal coronary angiography in 2001; tachycardia mediated cardiomyopathy  . Hypertension     with hypertensive heart disease  . Hyperlipidemia     04/2008:220, 289, 43, 119  . Diabetes mellitus, type 2     No insulin  . Atrial flutter     radiofrequency ablation in 2002; no subsequent recurrence  . Obesity   . Chronic anticoagulation   . Tobacco abuse   . Chronic obstructive pulmonary disease 04/20/2011    Past Surgical History  Procedure Date  . Atrial ablation surgery 2002    atrial flutter;  subsequently developed atrial fibrillation  . Implant-ilr     Subsequently removed    Family History  Problem Relation Age of Onset  . Alzheimer's disease Mother   . Osteoporosis Mother     History  Substance Use Topics  . Smoking status: Former Smoker -- 0.5 packs/day for 42 years    Types: Cigarettes    Quit date: 04/17/2010  . Smokeless tobacco: Never Used  . Alcohol Use: No      Review of Systems  Constitutional: Negative for fever.       10 Systems reviewed and are negative for acute change except as noted in the HPI.  HENT: Negative for congestion and rhinorrhea.   Eyes: Negative for discharge and redness.  Respiratory: Negative for cough and shortness of breath.   Cardiovascular: Negative for chest pain.  Gastrointestinal: Negative for vomiting, abdominal pain and diarrhea.  Genitourinary: Negative for dysuria.  Musculoskeletal: Negative for back pain.  Skin: Negative for rash.  Neurological: Negative for dizziness, syncope, speech difficulty, weakness, numbness and headaches.  Psychiatric/Behavioral: Negative for suicidal ideas, hallucinations and confusion.       No behavior change.    Allergies  Review of patient's allergies indicates no known allergies.  Home Medications   Current Outpatient Rx  Name Route Sig Dispense Refill  . ASPIRIN EC 81 MG PO TBEC Oral Take 81 mg by mouth every morning.      Marland Kitchen BUPROPION HCL ER (SR) 100 MG PO TB12 Oral Take 200 mg by mouth daily.     Marland Kitchen  CELECOXIB 200 MG PO CAPS Oral Take 200 mg by mouth daily.      Marland Kitchen DIGOXIN 0.25 MG PO TABS Oral Take 250 mcg by mouth daily.    Marland Kitchen DILTIAZEM HCL ER BEADS 360 MG PO CP24 Oral Take 1 capsule (360 mg total) by mouth daily. 90 capsule 3  . OMEGA-3 FATTY ACIDS 1000 MG PO CAPS Oral Take 1 g by mouth daily.      Marland Kitchen FLUTICASONE-SALMETEROL 250-50 MCG/DOSE IN AEPB Inhalation Inhale 2 puffs into the lungs 2 (two) times daily. With spacer 60 each 3  . FUROSEMIDE 20 MG PO TABS Oral Take 1 tablet (20  mg total) by mouth daily. 90 tablet 3  . LISINOPRIL 10 MG PO TABS Oral Take 1 tablet (10 mg total) by mouth daily. 30 tablet 3  . MAGNESIUM OXIDE 400 MG PO TABS Oral Take 1 tablet (400 mg total) by mouth daily. 30 tablet 6  . PANTOPRAZOLE SODIUM 40 MG PO TBEC Oral Take 40 mg by mouth daily.      Marland Kitchen ROSUVASTATIN CALCIUM 20 MG PO TABS Oral Take 20 mg by mouth at bedtime.     Marland Kitchen SITAGLIPTIN-METFORMIN HCL 50-1000 MG PO TABS Oral Take 1 tablet by mouth 2 (two) times daily with a meal.    . WARFARIN SODIUM 7.5 MG PO TABS Oral Take 7.5 mg by mouth daily. Currently taking 1/2 tab daily.      Triage Vitals: BP 128/68  Pulse 76  Temp(Src) 98.7 F (37.1 C) (Oral)  Resp 20  Ht 6' (1.829 m)  Wt 230 lb (104.327 kg)  BMI 31.19 kg/m2  SpO2 100%   Physical Exam  Nursing note and vitals reviewed. Constitutional: He is oriented to person, place, and time. He appears well-developed and well-nourished.       Awake, alert, nontoxic appearance.  HENT:  Head: Normocephalic and atraumatic.  Mouth/Throat: No oropharyngeal exudate.  Eyes: EOM are normal. Pupils are equal, round, and reactive to light. Right eye exhibits no discharge. Left eye exhibits no discharge.  Neck: Neck supple.  Cardiovascular: Normal rate.   No murmur heard.      Irregularly irregular   Pulmonary/Chest: Effort normal and breath sounds normal. No stridor. No respiratory distress. He has no wheezes. He has no rales. He exhibits no tenderness.  Abdominal: Soft. Bowel sounds are normal. He exhibits no mass. There is no tenderness. There is no rebound.  Musculoskeletal: He exhibits no tenderness.       Baseline ROM, no obvious new focal weakness.  Lymphadenopathy:    He has no cervical adenopathy.  Neurological: He is alert and oriented to person, place, and time.       Mental status and motor strength appears baseline for patient and situation.  Skin: Skin is warm and dry. No rash noted.  Psychiatric: He has a normal mood and affect.  His behavior is normal.  Neuro: Pupils equal reactive, extraocular movements intact, peripheral visual fields full to confrontation, no facial asymmetry, motor strength 5 out of 5 in all 4 extremities without pronator drift, normal light touch bilaterally in all 4 extremities, normal finger to nose testing, normal gait  ED Course  Procedures (including critical care time) ECG: Atrial fibrillation with ventricular rate 102 beats per minute, normal axis, QRS duration is normal, nonspecific ST and T-wave abnormality suggestive of dig effect, no comparison ECG available DIAGNOSTIC STUDIES: Oxygen Saturation is 100% on room air, normal by my interpretation.    COORDINATION OF CARE: 5:56PM-Discussed  blood panel and chest x-ray with pt and pt agreed. 6:57PM-Discussed lab and radiology results with pt and pt acknowledged results. Pt will be discharged home with instructions to follow-up with PCP within the next week.  Labs Reviewed  PROTIME-INR - Abnormal; Notable for the following:    Prothrombin Time 23.9 (*)    INR 2.10 (*)    All other components within normal limits  CBC - Abnormal; Notable for the following:    Platelets 134 (*)    All other components within normal limits  COMPREHENSIVE METABOLIC PANEL - Abnormal; Notable for the following:    Glucose, Bld 106 (*)    All other components within normal limits  PRO B NATRIURETIC PEPTIDE - Abnormal; Notable for the following:    Pro B Natriuretic peptide (BNP) 628.7 (*)    All other components within normal limits  DIGOXIN LEVEL  DIFFERENTIAL  POCT I-STAT TROPONIN I  I-STAT TROPONIN I   Dg Chest 2 View  05/07/2011  *RADIOLOGY REPORT*  Clinical Data: Shortness of breath, weakness and dizziness.  CHEST - 2 VIEW  Comparison: PA and lateral chest 03/27/2011.  Findings: The chest is hyperexpanded but the lungs appear clear. Heart size is mildly enlarged.  No pneumothorax or effusion.  IMPRESSION: COPD without acute disease.  Original Report  Authenticated By: Bernadene Bell. D'ALESSIO, M.D.     1. Generalized weakness   2. Atrial fibrillation       MDM  I doubt any other EMC precluding discharge at this time including, but not necessarily limited to the following:ACS, CVA, TIA, SBI.      I personally performed the services described in this documentation, which was scribed in my presence. The recorded information has been reviewed and considered.    [name] Horn, MD 05/07/11 [phone]

## 2011-05-07 NOTE — ED Notes (Signed)
Pt waiting for room in ED. Alert and oriented x 3. Skin warm and dry. Color pink.

## 2011-05-07 NOTE — ED Notes (Signed)
Pt c/o dizziness and difficulty breathing since Friday. Seen by Dr. [name] on Friday and meds were changed for his A-fib. Pt states that the dizziness has gotten worse.

## 2011-05-07 NOTE — ED Notes (Signed)
Discussed with MD and shared info with patient.  Cannot determine tonight what is causing his ringing in ears - could be related to medication or other causes.  Advised to follow up with his primary MD - call tomorrow for appt to be see within 3 days.

## 2011-05-07 NOTE — ED Notes (Signed)
Pt states dizziness for approx 1 week.  Pt reports his medications for atrial fibrillation were adjusted on Friday.  Pt resting on stretcher with respirations even and unlabored.

## 2011-05-09 ENCOUNTER — Telehealth: Payer: Self-pay | Admitting: Cardiology

## 2011-05-09 NOTE — Telephone Encounter (Signed)
Patient states was in [facility] ER Saturday.  Was advised to F/U with [place].  Patient states he is dizzy and has shakes, thinks it may be due to medication being changed.  Patient has f/u with Samara Deist on 05/19/2011, not sure if needs to be seen sooner.  Please call.  ER note in EPIC.

## 2011-05-09 NOTE — Telephone Encounter (Signed)
Will schedule patient to see Joni Reining, NP 1/23

## 2011-05-10 ENCOUNTER — Encounter: Payer: Self-pay | Admitting: Adult Health

## 2011-05-10 ENCOUNTER — Ambulatory Visit (INDEPENDENT_AMBULATORY_CARE_PROVIDER_SITE_OTHER): Payer: Medicaid Other | Admitting: Adult Health

## 2011-05-10 DIAGNOSIS — I480 Paroxysmal atrial fibrillation: Secondary | ICD-10-CM

## 2011-05-10 DIAGNOSIS — I4891 Unspecified atrial fibrillation: Secondary | ICD-10-CM

## 2011-05-10 DIAGNOSIS — I10 Essential (primary) hypertension: Secondary | ICD-10-CM

## 2011-05-10 NOTE — Progress Notes (Signed)
HPI: [name] returns to the office after being seen 4 days ago by Dr. Dietrich Cook. He is being followed for Atrial fibrillation. He is s/p RFA for atrial flutter in 2002. On last visit medication changes secondary to hypotension. Metoprolol was discontinued and diltiazem was increased to 360 mg daily. Lasix was decreased to 20 mg daily. He unfortunately had symptoms of dizziness and lightheadedness and was seen in the ER.  BP was 128/68. He states he felt jittery and shaky. Lasbs and X-rays were WNL. BNP 638. Pt 2.1.   He was sent home and told to follow-up with [place]. He still has complaints of feeling jittery today on this visit. He denies history of anxiety. He has a history of depression and is on Wellbutrin SR. He is aware that there are plans to have DCCV per Dr. Dietrich Cook, but this has not be planned yet.   No Known Allergies  Current Outpatient Prescriptions  Medication Sig Dispense Refill  . aspirin EC 81 MG tablet Take 81 mg by mouth every morning.        Marland Kitchen buPROPion (WELLBUTRIN SR) 100 MG 12 hr tablet Take 200 mg by mouth daily.       . celecoxib (CELEBREX) 200 MG capsule Take 200 mg by mouth daily.        . digoxin (LANOXIN) 0.25 MG tablet Take 250 mcg by mouth daily.      Marland Kitchen diltiazem (TIAZAC) 360 MG 24 hr capsule Take 1 capsule (360 mg total) by mouth daily.  90 capsule  3  . fish oil-omega-3 fatty acids 1000 MG capsule Take 1 g by mouth daily.        . Fluticasone-Salmeterol (ADVAIR) 250-50 MCG/DOSE AEPB Inhale 2 puffs into the lungs 2 (two) times daily. With spacer  60 each  3  . furosemide (LASIX) 20 MG tablet Take 1 tablet (20 mg total) by mouth daily.  90 tablet  3  . lisinopril (PRINIVIL,ZESTRIL) 10 MG tablet Take 1 tablet (10 mg total) by mouth daily.  30 tablet  3  . magnesium oxide (MAG-OX) 400 MG tablet Take 1 tablet (400 mg total) by mouth daily.  30 tablet  6  . pantoprazole (PROTONIX) 40 MG tablet Take 40 mg by mouth daily.        . rosuvastatin (CRESTOR) 20 MG tablet Take  20 mg by mouth at bedtime.       . sitaGLIPtan-metformin (JANUMET) 50-1000 MG per tablet Take 1 tablet by mouth 2 (two) times daily with a meal.      . warfarin (COUMADIN) 7.5 MG tablet Take 7.5 mg by mouth daily. Currently taking 1/2 tab daily.        Past Medical History  Diagnosis Date  . Paroxysmal atrial fibrillation     onset in 2009; cardiac cath in 2001-50% circumflex; recurred 04/2010, 03/2011; ventricular rate controlled; normal coronary angiography in 2001; tachycardia mediated cardiomyopathy  . Hypertension     with hypertensive heart disease  . Hyperlipidemia     04/2008:220, 289, 43, 119  . Diabetes mellitus, type 2     No insulin  . Atrial flutter     radiofrequency ablation in 2002; no subsequent recurrence  . Obesity   . Chronic anticoagulation   . Tobacco abuse   . Chronic obstructive pulmonary disease 04/20/2011    Past Surgical History  Procedure Date  . Atrial ablation surgery 2002    atrial flutter; subsequently developed atrial fibrillation  . Implant-ilr  Subsequently removed    ZOX:WRUEAV of systems complete and found to be negative unless listed above PHYSICAL EXAM BP 112/66  Pulse 74  Ht 6' (1.829 m)  Wt 231 lb (104.781 kg)  BMI 31.33 kg/m2  General: Well developed, well nourished, in no acute distress Head: Eyes PERRLA, No xanthomas.   Normal cephalic and atramatic  Lungs: Clear bilaterally to auscultation and percussion. Heart: HRIR , without MRG.  Pulses are 2+ & equal.            No carotid bruit. No JVD.  No abdominal bruits. No femoral bruits. Abdomen: Bowel sounds are positive, abdomen soft and non-tender without masses or                  Hernia's noted. Msk:  Back normal, normal gait. Normal strength and tone for age. Extremities: No clubbing, cyanosis or edema.  DP +1 Neuro: Alert and oriented X 3. Psych:  Flat affect, responds appropriately   ASSESSMENT AND PLAN

## 2011-05-10 NOTE — Assessment & Plan Note (Signed)
Well controlled at present.  No changes. 

## 2011-05-10 NOTE — Patient Instructions (Signed)
Your physician recommends that you schedule a follow-up appointment in: 2 weeks  Your physician recommends that you return for lab work in: TODAY [facility])

## 2011-05-10 NOTE — Assessment & Plan Note (Signed)
Heart rate is well controlled at present. His BP is stable. I believe some of this to be anxiety. He states he is nervous about his irregular heart rate. I will have labs drawn to check a TSH, and dig level. He is to continue on coumadin. Echo will be ordered as this was mentioned in Dr. Langston Masker note, but not officially ordered. He will follow-up with Dr. [name] in 2 weeks as this was previously scheduled. I have given him reassurance.

## 2011-05-11 LAB — BASIC METABOLIC PANEL
BUN: 11 mg/dL (ref 6–23)
CO2: 27 mEq/L (ref 19–32)
Calcium: 9.5 mg/dL (ref 8.4–10.5)
Chloride: 101 mEq/L (ref 96–112)
Creat: 0.91 mg/dL (ref 0.50–1.35)
Glucose, Bld: 159 mg/dL — ABNORMAL HIGH (ref 70–99)
Potassium: 3.9 mEq/L (ref 3.5–5.3)
Sodium: 143 mEq/L (ref 135–145)

## 2011-05-11 LAB — DIGOXIN LEVEL: Digoxin Level: 0.9 ng/mL (ref 0.8–2.0)

## 2011-05-11 LAB — TSH: TSH: 1.709 u[IU]/mL (ref 0.350–4.500)

## 2011-05-12 ENCOUNTER — Ambulatory Visit (HOSPITAL_COMMUNITY)
Admission: RE | Admit: 2011-05-12 | Discharge: 2011-05-12 | Disposition: A | Discharge status: Home / Self Care | Payer: Medicaid Other | Source: Ambulatory Visit | Attending: Adult Health | Admitting: Adult Health

## 2011-05-12 DIAGNOSIS — I517 Cardiomegaly: Secondary | ICD-10-CM

## 2011-05-12 DIAGNOSIS — I10 Essential (primary) hypertension: Secondary | ICD-10-CM

## 2011-05-12 DIAGNOSIS — E785 Hyperlipidemia, unspecified: Secondary | ICD-10-CM | POA: Insufficient documentation

## 2011-05-12 DIAGNOSIS — F17200 Nicotine dependence, unspecified, uncomplicated: Secondary | ICD-10-CM | POA: Insufficient documentation

## 2011-05-12 DIAGNOSIS — I4891 Unspecified atrial fibrillation: Secondary | ICD-10-CM | POA: Insufficient documentation

## 2011-05-12 NOTE — Progress Notes (Signed)
*  PRELIMINARY RESULTS* Echocardiogram 2D Echocardiogram has been performed.  Conrad [place] 05/12/2011, 12:52 PM2

## 2011-05-19 ENCOUNTER — Ambulatory Visit: Payer: Medicaid Other | Admitting: Adult Health

## 2011-05-22 ENCOUNTER — Ambulatory Visit (INDEPENDENT_AMBULATORY_CARE_PROVIDER_SITE_OTHER): Payer: Medicaid Other | Admitting: *Deleted

## 2011-05-22 DIAGNOSIS — I480 Paroxysmal atrial fibrillation: Secondary | ICD-10-CM

## 2011-05-22 DIAGNOSIS — Z7901 Long term (current) use of anticoagulants: Secondary | ICD-10-CM

## 2011-05-22 DIAGNOSIS — I4892 Unspecified atrial flutter: Secondary | ICD-10-CM

## 2011-05-22 DIAGNOSIS — I4891 Unspecified atrial fibrillation: Secondary | ICD-10-CM

## 2011-05-22 LAB — POCT INR: INR: 2.6

## 2011-05-26 ENCOUNTER — Encounter: Payer: Self-pay | Admitting: Cardiology

## 2011-05-26 ENCOUNTER — Ambulatory Visit (INDEPENDENT_AMBULATORY_CARE_PROVIDER_SITE_OTHER): Payer: Medicaid Other | Admitting: Cardiology

## 2011-05-26 DIAGNOSIS — I10 Essential (primary) hypertension: Secondary | ICD-10-CM

## 2011-05-26 DIAGNOSIS — I480 Paroxysmal atrial fibrillation: Secondary | ICD-10-CM

## 2011-05-26 DIAGNOSIS — I4891 Unspecified atrial fibrillation: Secondary | ICD-10-CM

## 2011-05-26 DIAGNOSIS — Z7901 Long term (current) use of anticoagulants: Secondary | ICD-10-CM

## 2011-05-26 DIAGNOSIS — E785 Hyperlipidemia, unspecified: Secondary | ICD-10-CM

## 2011-05-26 NOTE — Patient Instructions (Signed)
Your physician recommends that you schedule a follow-up appointment in: 3 weeks post Cardioversion  Stool Cards x 3 and return to office as soon as possible

## 2011-05-26 NOTE — Progress Notes (Signed)
Patient ID: [name] Cook, male   DOB: 09/19/1950, 61 y.o.   MRN: [id number] HPI: Scheduled return visit for this nice gentleman with paroxysmal atrial fibrillation.  Since his last visit, there has been no significant change in his sense of well-being, which has not been particularly good.  He notes tinnitus, lightheadedness without syncope, generalized malaise and poor exercise tolerance.  He has had no chest discomfort, orthopnea, PND nor dyspnea.  He notes no palpitations.  Prior to Admission medications   Medication Sig Start Date End Date Taking? Authorizing Provider  aspirin EC 81 MG tablet Take 81 mg by mouth every morning.     Yes Historical Provider, MD  buPROPion (WELLBUTRIN SR) 100 MG 12 hr tablet Take 200 mg by mouth daily.    Yes Historical Provider, MD  celecoxib (CELEBREX) 200 MG capsule Take 200 mg by mouth daily.     Yes Historical Provider, MD  digoxin (LANOXIN) 0.25 MG tablet Take 250 mcg by mouth daily. 04/20/11 04/19/12 Yes Edie Vallandingham M. Shafter Jupin, MD  diltiazem (TIAZAC) 360 MG 24 hr capsule Take 1 capsule (360 mg total) by mouth daily. 05/05/11 05/04/12 Yes Alexzandrea Normington M. Araly Kaas, MD  fish oil-omega-3 fatty acids 1000 MG capsule Take 1 g by mouth daily.     Yes Historical Provider, MD  Fluticasone-Salmeterol (ADVAIR) 250-50 MCG/DOSE AEPB Inhale 2 puffs into the lungs 2 (two) times daily. With spacer 04/20/11  Yes Gorden Stthomas M. Shilpa Bushee, MD  furosemide (LASIX) 20 MG tablet Take 1 tablet (20 mg total) by mouth daily. 05/05/11 05/04/12 Yes Aubryn Spinola M. Won Kreuzer, MD  lisinopril (PRINIVIL,ZESTRIL) 10 MG tablet Take 1 tablet (10 mg total) by mouth daily. 04/20/11 04/19/12 Yes Laurence Crofford M. Caymen Dubray, MD  magnesium oxide (MAG-OX) 400 MG tablet Take 1 tablet (400 mg total) by mouth daily. 11/09/10  Yes Kathryn Lawrence, NP  pantoprazole (PROTONIX) 40 MG tablet Take 40 mg by mouth daily.     Yes Historical Provider, MD  rosuvastatin (CRESTOR) 20 MG tablet Take 20 mg by mouth at bedtime.    Yes Historical Provider, MD    sitaGLIPtan-metformin (JANUMET) 50-1000 MG per tablet Take 1 tablet by mouth 2 (two) times daily with a meal.   Yes Historical Provider, MD  warfarin (COUMADIN) 7.5 MG tablet Take 7.5 mg by mouth daily. Currently taking 1/2 tab daily. 04/20/11 04/19/12 Yes Jahrell Hamor M. Ramere Downs, MD    No Known Allergies    Past medical history, social history, and family history reviewed and updated.  ROS: See history of present illness  PHYSICAL EXAM: BP 126/79  Pulse 84  Resp 18  Ht 6' (1.829 m)  Wt 106.595 kg (235 lb)  BMI 31.87 kg/m2  General-Well developed; no acute distress Body habitus-mildly to moderately overweight Neck-No JVD; no carotid bruits Lungs-clear lung fields; resonant to percussion Cardiovascular-normal PMI; normal S1 and S2; irregular rhythm Abdomen-normal bowel sounds; soft and non-tender without masses or organomegaly Musculoskeletal-No deformities, no cyanosis or clubbing Neurologic-Normal cranial nerves; symmetric strength and tone Skin-Warm, no significant lesions Extremities-distal pulses intact; no edema  Rhythm Strip: atrial fibrillation; ventricular rate of 100 bpm  ASSESSMENT AND PLAN:  Adilenne Ashworth, MD 05/26/2011 3:37 PM   

## 2011-05-26 NOTE — Assessment & Plan Note (Signed)
Patient reports that systolics are typically below 140 at home.  He will collect at least 10 measurements for review at his next office visit.

## 2011-05-26 NOTE — Assessment & Plan Note (Signed)
Adequate anticoagulation has been present for at least one month.  Patient is symptomatic despite relatively good control of heart rate.  We will proceed with DC cardioversion as planned.  The risks and benefits have been explained to [name] Cook who agrees to proceed.

## 2011-05-26 NOTE — Assessment & Plan Note (Addendum)
Control of hyperlipidemia is superlative; current therapy will be continued.

## 2011-05-26 NOTE — Assessment & Plan Note (Signed)
Stool Hemoccult testing to be performed to exclude occult GI blood loss.

## 2011-05-29 ENCOUNTER — Other Ambulatory Visit: Payer: Self-pay

## 2011-05-29 ENCOUNTER — Encounter: Payer: Self-pay | Admitting: *Deleted

## 2011-05-29 ENCOUNTER — Ambulatory Visit (INDEPENDENT_AMBULATORY_CARE_PROVIDER_SITE_OTHER): Payer: Medicaid Other | Admitting: *Deleted

## 2011-05-29 ENCOUNTER — Encounter (INDEPENDENT_AMBULATORY_CARE_PROVIDER_SITE_OTHER): Payer: Medicaid Other | Admitting: *Deleted

## 2011-05-29 ENCOUNTER — Other Ambulatory Visit: Payer: Self-pay | Admitting: *Deleted

## 2011-05-29 DIAGNOSIS — I4891 Unspecified atrial fibrillation: Secondary | ICD-10-CM

## 2011-05-29 DIAGNOSIS — Z7901 Long term (current) use of anticoagulants: Secondary | ICD-10-CM

## 2011-05-29 DIAGNOSIS — I4892 Unspecified atrial flutter: Secondary | ICD-10-CM

## 2011-05-29 DIAGNOSIS — I480 Paroxysmal atrial fibrillation: Secondary | ICD-10-CM

## 2011-05-29 LAB — POCT INR: INR: 3.8

## 2011-05-30 ENCOUNTER — Encounter: Payer: Self-pay | Admitting: *Deleted

## 2011-05-31 ENCOUNTER — Other Ambulatory Visit: Payer: Self-pay

## 2011-05-31 ENCOUNTER — Encounter (HOSPITAL_COMMUNITY): Payer: Self-pay

## 2011-05-31 ENCOUNTER — Encounter (HOSPITAL_COMMUNITY)
Admission: RE | Disposition: A | Discharge status: Home / Self Care | Payer: Self-pay | Source: Ambulatory Visit | Attending: Cardiology

## 2011-05-31 ENCOUNTER — Ambulatory Visit (HOSPITAL_COMMUNITY)
Admission: RE | Admit: 2011-05-31 | Discharge: 2011-05-31 | Disposition: A | Discharge status: Home / Self Care | Payer: Medicaid Other | Source: Ambulatory Visit | Attending: Cardiology | Admitting: Cardiology

## 2011-05-31 ENCOUNTER — Institutional Professional Consult (permissible substitution): Payer: Medicaid Other | Admitting: Cardiology

## 2011-05-31 ENCOUNTER — Encounter (HOSPITAL_COMMUNITY): Payer: Self-pay | Admitting: *Deleted

## 2011-05-31 DIAGNOSIS — Z01812 Encounter for preprocedural laboratory examination: Secondary | ICD-10-CM | POA: Insufficient documentation

## 2011-05-31 DIAGNOSIS — I4891 Unspecified atrial fibrillation: Secondary | ICD-10-CM | POA: Insufficient documentation

## 2011-05-31 DIAGNOSIS — I10 Essential (primary) hypertension: Secondary | ICD-10-CM

## 2011-05-31 DIAGNOSIS — Z7901 Long term (current) use of anticoagulants: Secondary | ICD-10-CM | POA: Insufficient documentation

## 2011-05-31 DIAGNOSIS — R42 Dizziness and giddiness: Secondary | ICD-10-CM | POA: Insufficient documentation

## 2011-05-31 DIAGNOSIS — E785 Hyperlipidemia, unspecified: Secondary | ICD-10-CM

## 2011-05-31 DIAGNOSIS — I480 Paroxysmal atrial fibrillation: Secondary | ICD-10-CM

## 2011-05-31 HISTORY — PX: CARDIOVERSION: SHX1299

## 2011-05-31 LAB — GLUCOSE, CAPILLARY: Glucose-Capillary: 174 mg/dL — ABNORMAL HIGH (ref 70–99)

## 2011-05-31 SURGERY — CARDIOVERSION
Anesthesia: Monitor Anesthesia Care | Wound class: Clean

## 2011-05-31 MED ORDER — MIDAZOLAM HCL 5 MG/5ML IJ SOLN
INTRAMUSCULAR | Status: AC
  Filled 2011-05-31: qty 10

## 2011-05-31 MED ORDER — MIDAZOLAM HCL 5 MG/ML IJ SOLN
INTRAMUSCULAR | Status: DC | PRN
  Administered 2011-05-31: 1 mg via INTRAVENOUS
  Administered 2011-05-31 (×4): 2 mg via INTRAVENOUS

## 2011-05-31 MED ORDER — SODIUM CHLORIDE 0.45 % IV SOLN
INTRAVENOUS | Status: DC
  Administered 2011-05-31: 1000 mL via INTRAVENOUS

## 2011-05-31 MED ORDER — MIDAZOLAM HCL 5 MG/5ML IJ SOLN
INTRAMUSCULAR | Status: AC | PRN
  Administered 2011-05-31: 2 mg via INTRAVENOUS

## 2011-05-31 NOTE — OR Nursing (Signed)
Coumadin  Dose to 5 mg started this appr. 2 weeks ago.  Took dose of coumadin last night

## 2011-05-31 NOTE — Progress Notes (Signed)
Pt. Identified & confirmed procedure. Dr. [name] @ bedside & answered pt.'s questions.  Pt. Connected to all monitors with alarms on.  Respiratory & emergency equipment @ bedside. Time Out was done @ 10:28am.  Sedation started with a total of 9mg  versed given.  Pt. Was shocked @ 200J by Dr. [name] for a total of 3 times.  Pt. Was not converted to NSR & remained in afib.  Pt. Recovered in the PACU

## 2011-05-31 NOTE — OR Nursing (Signed)
Left eye lid is swollen and red.  Staes it don't hurt, and started this morning.

## 2011-05-31 NOTE — Progress Notes (Signed)
No redness at treatment sites.

## 2011-05-31 NOTE — Progress Notes (Signed)
Dr [name] in to see pt.

## 2011-05-31 NOTE — Procedures (Signed)
Electrical Cardioversion Procedure Note [name] Cook [id number] 1950/08/20  Procedure: Electrical Cardioversion Indications:  Atrial Fibrillation  Procedure Details Consent: Risks of procedure as well as the alternatives and risks of each were explained to the (patient/caregiver).  Consent for procedure obtained. Time Out: Verified patient identification, verified procedure, site/side was marked, verified correct patient position, special equipment/implants available, medications/allergies/relevent history reviewed, required imaging and test results available.  Performed  Patient placed on cardiac monitor, pulse oximetry, supplemental oxygen as necessary.  Sedation given: Benzodiazepines Pacer pads placed anterior and posterior chest.  Cardioverted 3 time(s).  Cardioverted at 200J.  Evaluation Findings: Post procedure EKG shows: Atrial Fibrillation Complications: None Patient did tolerate procedure well.   [place] Bing 05/31/2011, 10:39 AM

## 2011-05-31 NOTE — Interval H&P Note (Signed)
History and Physical Interval Note:  05/31/2011 10:24 AM  [name] Cook  has presented today for surgery, with the diagnosis of a-fib  The various methods of treatment have been discussed with the patient and family. After consideration of risks, benefits and other options for treatment, the patient has consented to undergo cardioversion.  The patients' history has been reviewed, patient examined, no change in status, stable for surgery.  I have reviewed the patients' chart and labs.  Questions were answered to the patient's satisfaction.     [place] Bing

## 2011-05-31 NOTE — H&P (View-Only) (Signed)
Patient ID: [name] Cook, male   DOB: May 03, 1950, 61 y.o.   MRN: [id number] HPI: Scheduled return visit for this nice gentleman with paroxysmal atrial fibrillation.  Since his last visit, there has been no significant change in his sense of well-being, which has not been particularly good.  He notes tinnitus, lightheadedness without syncope, generalized malaise and poor exercise tolerance.  He has had no chest discomfort, orthopnea, PND nor dyspnea.  He notes no palpitations.  Prior to Admission medications   Medication Sig Start Date End Date Taking? Authorizing Provider  aspirin EC 81 MG tablet Take 81 mg by mouth every morning.     Yes Historical Provider, MD  buPROPion (WELLBUTRIN SR) 100 MG 12 hr tablet Take 200 mg by mouth daily.    Yes Historical Provider, MD  celecoxib (CELEBREX) 200 MG capsule Take 200 mg by mouth daily.     Yes Historical Provider, MD  digoxin (LANOXIN) 0.25 MG tablet Take 250 mcg by mouth daily. 04/20/11 04/19/12 Yes [name]. Jarrod Bodkins, MD  diltiazem (TIAZAC) 360 MG 24 hr capsule Take 1 capsule (360 mg total) by mouth daily. 05/05/11 05/04/12 Yes [name]. Stokely Jeancharles, MD  fish oil-omega-3 fatty acids 1000 MG capsule Take 1 g by mouth daily.     Yes Historical Provider, MD  Fluticasone-Salmeterol (ADVAIR) 250-50 MCG/DOSE AEPB Inhale 2 puffs into the lungs 2 (two) times daily. With spacer 04/20/11  Yes [name]. Ofilia Rayon, MD  furosemide (LASIX) 20 MG tablet Take 1 tablet (20 mg total) by mouth daily. 05/05/11 05/04/12 Yes [name]. Tonnia Bardin, MD  lisinopril (PRINIVIL,ZESTRIL) 10 MG tablet Take 1 tablet (10 mg total) by mouth daily. 04/20/11 04/19/12 Yes [name]. Barron Vanloan, MD  magnesium oxide (MAG-OX) 400 MG tablet Take 1 tablet (400 mg total) by mouth daily. 11/09/10  Yes Joni Reining, NP  pantoprazole (PROTONIX) 40 MG tablet Take 40 mg by mouth daily.     Yes Historical Provider, MD  rosuvastatin (CRESTOR) 20 MG tablet Take 20 mg by mouth at bedtime.    Yes Historical Provider, MD    sitaGLIPtan-metformin (JANUMET) 50-1000 MG per tablet Take 1 tablet by mouth 2 (two) times daily with a meal.   Yes Historical Provider, MD  warfarin (COUMADIN) 7.5 MG tablet Take 7.5 mg by mouth daily. Currently taking 1/2 tab daily. 04/20/11 04/19/12 Yes [name]. [name], MD    No Known Allergies    Past medical history, social history, and family history reviewed and updated.  ROS: See history of present illness  PHYSICAL EXAM: BP 126/79  Pulse 84  Resp 18  Ht 6' (1.829 m)  Wt 106.595 kg (235 lb)  BMI 31.87 kg/m2  General-Well developed; no acute distress Body habitus-mildly to moderately overweight Neck-No JVD; no carotid bruits Lungs-clear lung fields; resonant to percussion Cardiovascular-normal PMI; normal S1 and S2; irregular rhythm Abdomen-normal bowel sounds; soft and non-tender without masses or organomegaly Musculoskeletal-No deformities, no cyanosis or clubbing Neurologic-Normal cranial nerves; symmetric strength and tone Skin-Warm, no significant lesions Extremities-distal pulses intact; no edema  Rhythm Strip: atrial fibrillation; ventricular rate of 100 bpm  ASSESSMENT AND PLAN:   Bing, MD 05/26/2011 3:37 PM

## 2011-05-31 NOTE — Discharge Instructions (Signed)
Rest at home today-no driving.

## 2011-06-04 ENCOUNTER — Encounter (HOSPITAL_COMMUNITY): Payer: Self-pay

## 2011-06-04 ENCOUNTER — Emergency Department (HOSPITAL_COMMUNITY)
Admission: EM | Admit: 2011-06-04 | Discharge: 2011-06-04 | Disposition: A | Discharge status: Home / Self Care | Payer: Medicaid Other | Source: Home / Self Care | Attending: Emergency Medicine | Admitting: Emergency Medicine

## 2011-06-04 ENCOUNTER — Other Ambulatory Visit: Payer: Self-pay

## 2011-06-04 DIAGNOSIS — R0602 Shortness of breath: Secondary | ICD-10-CM | POA: Insufficient documentation

## 2011-06-04 DIAGNOSIS — I4891 Unspecified atrial fibrillation: Secondary | ICD-10-CM

## 2011-06-04 DIAGNOSIS — Z7901 Long term (current) use of anticoagulants: Secondary | ICD-10-CM | POA: Insufficient documentation

## 2011-06-04 DIAGNOSIS — R000 Tachycardia, unspecified: Secondary | ICD-10-CM | POA: Insufficient documentation

## 2011-06-04 DIAGNOSIS — E785 Hyperlipidemia, unspecified: Secondary | ICD-10-CM | POA: Insufficient documentation

## 2011-06-04 DIAGNOSIS — I119 Hypertensive heart disease without heart failure: Secondary | ICD-10-CM | POA: Insufficient documentation

## 2011-06-04 DIAGNOSIS — Z7982 Long term (current) use of aspirin: Secondary | ICD-10-CM | POA: Insufficient documentation

## 2011-06-04 DIAGNOSIS — Z87891 Personal history of nicotine dependence: Secondary | ICD-10-CM | POA: Insufficient documentation

## 2011-06-04 DIAGNOSIS — E119 Type 2 diabetes mellitus without complications: Secondary | ICD-10-CM | POA: Insufficient documentation

## 2011-06-04 LAB — CBC
HCT: 40.9 % (ref 39.0–52.0)
Hemoglobin: 14 g/dL (ref 13.0–17.0)
MCH: 31.1 pg (ref 26.0–34.0)
MCHC: 34.2 g/dL (ref 30.0–36.0)
MCV: 90.9 fL (ref 78.0–100.0)
Platelets: 145 10*3/uL — ABNORMAL LOW (ref 150–400)
RBC: 4.5 MIL/uL (ref 4.22–5.81)
RDW: 14 % (ref 11.5–15.5)
WBC: 5.6 10*3/uL (ref 4.0–10.5)

## 2011-06-04 LAB — DIFFERENTIAL
Basophils Absolute: 0.1 10*3/uL (ref 0.0–0.1)
Basophils Relative: 1 % (ref 0–1)
Eosinophils Absolute: 0.3 10*3/uL (ref 0.0–0.7)
Eosinophils Relative: 5 % (ref 0–5)
Lymphocytes Relative: 34 % (ref 12–46)
Lymphs Abs: 1.9 10*3/uL (ref 0.7–4.0)
Monocytes Absolute: 0.5 10*3/uL (ref 0.1–1.0)
Monocytes Relative: 9 % (ref 3–12)
Neutro Abs: 2.8 10*3/uL (ref 1.7–7.7)
Neutrophils Relative %: 51 % (ref 43–77)

## 2011-06-04 LAB — APTT: aPTT: 46 seconds — ABNORMAL HIGH (ref 24–37)

## 2011-06-04 LAB — BASIC METABOLIC PANEL
BUN: 15 mg/dL (ref 6–23)
CO2: 26 mEq/L (ref 19–32)
Calcium: 10.4 mg/dL (ref 8.4–10.5)
Chloride: 99 mEq/L (ref 96–112)
Creatinine, Ser: 0.72 mg/dL (ref 0.50–1.35)
GFR calc Af Amer: >90 mL/min (ref >90)
GFR calc non Af Amer: >90 mL/min (ref >90)
Glucose, Bld: 326 mg/dL — ABNORMAL HIGH (ref 70–99)
Potassium: 4.3 mEq/L (ref 3.5–5.1)
Sodium: 135 mEq/L (ref 135–145)

## 2011-06-04 LAB — PROTIME-INR
INR: 3.33 — ABNORMAL HIGH (ref 0.00–1.49)
Prothrombin Time: 34.3 seconds — ABNORMAL HIGH (ref 11.6–15.2)

## 2011-06-04 LAB — DIGOXIN LEVEL: Digoxin Level: 0.8 ng/mL (ref 0.8–2.0)

## 2011-06-04 LAB — PRO B NATRIURETIC PEPTIDE: Pro B Natriuretic peptide (BNP): 378.5 pg/mL — ABNORMAL HIGH (ref 0–125)

## 2011-06-04 MED ORDER — METOPROLOL TARTRATE 1 MG/ML IV SOLN
5.0000 mg | Freq: Once | INTRAVENOUS | Status: AC
  Administered 2011-06-04: 5 mg via INTRAVENOUS
  Filled 2011-06-04: qty 5

## 2011-06-04 MED ORDER — METOPROLOL TARTRATE 25 MG PO TABS: 12.5000 mg | ORAL_TABLET | Freq: Two times a day (BID) | ORAL | Status: DC

## 2011-06-04 NOTE — ED Provider Notes (Signed)
History     CSN: [id number]  Arrival date & time 06/04/11  4098   First MD Initiated Contact with Patient 06/04/11 1836      Chief Complaint  Patient presents with  . Tachycardia  . Shortness of Breath   Level V caveat for poor historian  (Consider location/radiation/quality/duration/timing/severity/associated sxs/prior treatment) Patient is a 61 y.o. male presenting with shortness of breath.  Shortness of Breath  Associated symptoms include shortness of breath.   Patient states about 3 or 4 days ago Dr. [name] try to get his heart back and rhythm and shocked him. He was under the effects of the anesthesia for a day afterwards and the following day started feeling weak. States this afternoon he felt short of breath felt like his heart was beating fast. He denies chest pain or swelling of his legs.  PCP Dr. Lacie Scotts in [facility] Cardiologist Dr. [name]   Past Medical History  Diagnosis Date  . Paroxysmal atrial fibrillation     onset in 2009; cardiac cath in 2001-50% circumflex; recurred 04/2010, 03/2011; ventricular rate controlled; normal coronary angiography in 2001; tachycardia mediated cardiomyopathy  . Hypertension     with hypertensive heart disease  . Hyperlipidemia     04/2008:220, 289, 43, 119  . Diabetes mellitus, type 2     No insulin  . Atrial flutter     radiofrequency ablation in 2002; no subsequent recurrence  . Obesity   . Chronic anticoagulation   . Tobacco abuse   . Chronic obstructive pulmonary disease 04/20/2011    Past Surgical History  Procedure Date  . Atrial ablation surgery 2002    atrial flutter; subsequently developed atrial fibrillation  . Implant-ilr     Subsequently removed    Family History  Problem Relation Age of Onset  . Alzheimer's disease Mother   . Osteoporosis Mother     History  Substance Use Topics  . Smoking status: Former Smoker -- 0.5 packs/day for 42 years    Types: Cigarettes    Quit date: 04/17/2010  . Smokeless  tobacco: Never Used  . Alcohol Use: No   lives with girlfriend On disability    Review of Systems  Respiratory: Positive for shortness of breath.   All other systems reviewed and are negative.    Allergies  Review of patient's allergies indicates no known allergies.  Home Medications   Current Outpatient Rx  Name Route Sig Dispense Refill  . ASPIRIN EC 81 MG PO TBEC Oral Take 81 mg by mouth every morning.      Marland Kitchen BUPROPION HCL ER (SR) 100 MG PO TB12 Oral Take 200 mg by mouth daily.     . CELECOXIB 200 MG PO CAPS Oral Take 200 mg by mouth daily.      Marland Kitchen DIGOXIN 0.25 MG PO TABS Oral Take 250 mcg by mouth daily.    Marland Kitchen DILTIAZEM HCL ER BEADS 360 MG PO CP24 Oral Take 1 capsule (360 mg total) by mouth daily. 90 capsule 3  . OMEGA-3 FATTY ACIDS 1000 MG PO CAPS Oral Take 1 g by mouth daily.      Marland Kitchen FLUTICASONE-SALMETEROL 250-50 MCG/DOSE IN AEPB Inhalation Inhale 2 puffs into the lungs 2 (two) times daily. With spacer 60 each 3  . FUROSEMIDE 20 MG PO TABS Oral Take 1 tablet (20 mg total) by mouth daily. 90 tablet 3  . LISINOPRIL 10 MG PO TABS Oral Take 1 tablet (10 mg total) by mouth daily. 30 tablet 3  . MAGNESIUM  OXIDE 400 MG PO TABS Oral Take 1 tablet (400 mg total) by mouth daily. 30 tablet 6  . PANTOPRAZOLE SODIUM 40 MG PO TBEC Oral Take 40 mg by mouth daily.      Marland Kitchen ROSUVASTATIN CALCIUM 20 MG PO TABS Oral Take 20 mg by mouth at bedtime.     Marland Kitchen SITAGLIPTIN-METFORMIN HCL 50-1000 MG PO TABS Oral Take 1 tablet by mouth 2 (two) times daily with a meal.    . WARFARIN SODIUM 7.5 MG PO TABS Oral Take 7.5 mg by mouth daily. Currently taking 1/2 tab daily.      BP 156/86  Pulse 90  Temp(Src) 97.3 F (36.3 C) (Oral)  Resp 18  Ht 6' (1.829 m)  Wt 235 lb (106.595 kg)  BMI 31.87 kg/m2  Vital signs normal    Physical Exam  Nursing note and vitals reviewed. Constitutional: He is oriented to person, place, and time. He appears well-developed and well-nourished.  Non-toxic appearance. He does  not appear ill. No distress.  HENT:  Head: Normocephalic and atraumatic.  Right Ear: External ear normal.  Left Ear: External ear normal.  Nose: Nose normal. No mucosal edema or rhinorrhea.  Mouth/Throat: Oropharynx is clear and moist and mucous membranes are normal. No dental abscesses or uvula swelling.  Eyes: Conjunctivae and EOM are normal. Pupils are equal, round, and reactive to light.  Neck: Normal range of motion and full passive range of motion without pain. Neck supple.  Cardiovascular: Normal heart sounds.  An irregular rhythm present. Tachycardia present.  Exam reveals no gallop and no friction rub.   No murmur heard. Pulmonary/Chest: Effort normal and breath sounds normal. No respiratory distress. He has no wheezes. He has no rhonchi. He has no rales. He exhibits no tenderness and no crepitus.  Abdominal: Soft. Normal appearance and bowel sounds are normal. He exhibits no distension. There is no tenderness. There is no rebound and no guarding.  Musculoskeletal: Normal range of motion. He exhibits no edema and no tenderness.       Moves all extremities well.   Neurological: He is alert and oriented to person, place, and time. He has normal strength. No cranial nerve deficit.  Skin: Skin is warm, dry and intact. No rash noted. No erythema. No pallor.  Psychiatric: His speech is normal and behavior is normal. His mood appears not anxious.       Affect is flat    ED Course  Procedures (including critical care time)   20:20 Lopressor 5 mg IV given  HR now 90-110.  I just reviewed the patient's ER visit from January and at that point it was discussed that he was having shortness of breath related to his beta blockers which were stopped and he was started on a calcium channel blocker. At that point he was waiting to get cardioversion. His cardioversion was actually done February 13 by Dr. [name], he was shocked 3 times without converting to sinus rhythm.  21:00 HR 80-110, feeling  better. Will discuss his home meds with [facility].   21:20 Dr Mayford Knife, [facility] for [facility], put on metoprolol 12.5 mg twice a day and call Dr Marvel Plan office tomorrow  Results for orders placed during the hospital encounter of 06/04/11  CBC      Component Value Range   WBC 5.6  4.0 - 10.5 (K/uL)   RBC 4.50  4.22 - 5.81 (MIL/uL)   Hemoglobin 14.0  13.0 - 17.0 (g/dL)   HCT 16.1  09.6 -  52.0 (%)   MCV 90.9  78.0 - 100.0 (fL)   MCH 31.1  26.0 - 34.0 (pg)   MCHC 34.2  30.0 - 36.0 (g/dL)   RDW 57.8  46.9 - 62.9 (%)   Platelets 145 (*) 150 - 400 (K/uL)  DIFFERENTIAL      Component Value Range   Neutrophils Relative 51  43 - 77 (%)   Neutro Abs 2.8  1.7 - 7.7 (K/uL)   Lymphocytes Relative 34  12 - 46 (%)   Lymphs Abs 1.9  0.7 - 4.0 (K/uL)   Monocytes Relative 9  3 - 12 (%)   Monocytes Absolute 0.5  0.1 - 1.0 (K/uL)   Eosinophils Relative 5  0 - 5 (%)   Eosinophils Absolute 0.3  0.0 - 0.7 (K/uL)   Basophils Relative 1  0 - 1 (%)   Basophils Absolute 0.1  0.0 - 0.1 (K/uL)  BASIC METABOLIC PANEL      Component Value Range   Sodium 135  135 - 145 (mEq/L)   Potassium 4.3  3.5 - 5.1 (mEq/L)   Chloride 99  96 - 112 (mEq/L)   CO2 26  19 - 32 (mEq/L)   Glucose, Bld 326 (*) 70 - 99 (mg/dL)   BUN 15  6 - 23 (mg/dL)   Creatinine, Ser 5.28  0.50 - 1.35 (mg/dL)   Calcium 41.3  8.4 - 10.5 (mg/dL)   GFR calc non Af Amer >90  >90 (mL/min)   GFR calc Af Amer >90  >90 (mL/min)  PRO B NATRIURETIC PEPTIDE      Component Value Range   Pro B Natriuretic peptide (BNP) 378.5 (*) 0 - 125 (pg/mL)  APTT      Component Value Range   aPTT 46 (*) 24 - 37 (seconds)  PROTIME-INR      Component Value Range   Prothrombin Time 34.3 (*) 11.6 - 15.2 (seconds)   INR 3.33 (*) 0.00 - 1.49    Laboratory interpretation all normal except mildly elevated BNP and over therapeutic INR   Dg Chest 2 View  05/07/2011  *RADIOLOGY REPORT*  Clinical Data: Shortness of breath, weakness and  dizziness.  CHEST - 2 VIEW  Comparison: PA and lateral chest 03/27/2011.  Findings: The chest is hyperexpanded but the lungs appear clear. Heart size is mildly enlarged.  No pneumothorax or effusion.  IMPRESSION: COPD without acute disease.  Original Report Authenticated By: Bernadene Bell. Maricela Curet, M.D.    Date: 06/04/2011  Rate: 102 up to 148  Rhythm: atrial fibrillation  QRS Axis: normal  Intervals: normal  ST/T Wave abnormalities: nonspecific ST/T changes  Conduction Disutrbances:none  Narrative Interpretation:   Old EKG Reviewed: unchanged from 05/30/2010   Diagnoses that have been ruled out:  None  Diagnoses that are still under consideration:  None  Final diagnoses:  Atrial fibrillation with rapid ventricular response   New Prescriptions   METOPROLOL (LOPRESSOR) 25 MG TABLET    Take 0.5 tablets (12.5 mg total) by mouth 2 (two) times daily.   Plan discharge Devoria Albe, MD, FACEP       MDM          Ward Givens, MD 06/04/11 2130

## 2011-06-04 NOTE — ED Notes (Addendum)
Pt presents with a feeling of his heart racing and SOB today. NAD at this time.

## 2011-06-05 ENCOUNTER — Encounter: Payer: Self-pay | Admitting: Adult Health

## 2011-06-05 ENCOUNTER — Ambulatory Visit (INDEPENDENT_AMBULATORY_CARE_PROVIDER_SITE_OTHER): Payer: Medicaid Other | Admitting: Adult Health

## 2011-06-05 ENCOUNTER — Ambulatory Visit (INDEPENDENT_AMBULATORY_CARE_PROVIDER_SITE_OTHER): Payer: Medicaid Other | Admitting: *Deleted

## 2011-06-05 VITALS — BP 125/82 | HR 77 | Ht 72.0 in | Wt 234.0 lb

## 2011-06-05 DIAGNOSIS — I480 Paroxysmal atrial fibrillation: Secondary | ICD-10-CM

## 2011-06-05 DIAGNOSIS — I4891 Unspecified atrial fibrillation: Secondary | ICD-10-CM

## 2011-06-05 DIAGNOSIS — I4892 Unspecified atrial flutter: Secondary | ICD-10-CM

## 2011-06-05 DIAGNOSIS — I10 Essential (primary) hypertension: Secondary | ICD-10-CM

## 2011-06-05 DIAGNOSIS — Z7901 Long term (current) use of anticoagulants: Secondary | ICD-10-CM

## 2011-06-05 LAB — POCT INR: INR: 3

## 2011-06-05 NOTE — Assessment & Plan Note (Signed)
The patient has been seen and examined by me and by Dr.Rothbart. Although he is asymptomatic now, he will need further medications to prevent rapid HR, and possibly conversion to NSR. Dr. [name] has advised the patient that Tikosyn loading is an option, but would require hospitalization to do so. Mr. Sherk agrees to be admitted for this administration. Arrangements will be made to bring him in on 06/06/2011 . We will see him post discharge.

## 2011-06-05 NOTE — Progress Notes (Signed)
HPI: [name] is a 61 y/o patient of Dr.Rothbart we are seeing for ongoing assessment and treatment of Atrial fib, hypercholesterolemia, diabetes, hypertension. He had a DCCV per Dr.Rothbart on 05/31/11 that was unsuccessful for conversion to NSR and he remained in Afib. Metoprolol was discontinued and Cardizem was increased to 360 mg daily, he was to continue digoxin.. On 06/04/2011 he was seen in the ER with Afib RVR rate of 121 bpm. He was given 5 mg of IV lopressor and placed on metoprolol 12.5 mg BID. He comes today on follow up with complaints of continued fatigue. He denies chest pain or palpitations. He is continued on anticoagulation and is medically complaint.   No Known Allergies  Current Outpatient Prescriptions  Medication Sig Dispense Refill  . aspirin EC 81 MG tablet Take 81 mg by mouth every morning.        Marland Kitchen buPROPion (WELLBUTRIN) 100 MG tablet Take 100 mg by mouth 2 (two) times daily.      . celecoxib (CELEBREX) 200 MG capsule Take 200 mg by mouth daily.        . digoxin (LANOXIN) 0.25 MG tablet Take 250 mcg by mouth daily.      Marland Kitchen diltiazem (TIAZAC) 360 MG 24 hr capsule Take 1 capsule (360 mg total) by mouth daily.  90 capsule  3  . fish oil-omega-3 fatty acids 1000 MG capsule Take 1 g by mouth daily.        . Fluticasone-Salmeterol (ADVAIR) 250-50 MCG/DOSE AEPB Inhale 2 puffs into the lungs 2 (two) times daily. With spacer  60 each  3  . furosemide (LASIX) 20 MG tablet Take 1 tablet (20 mg total) by mouth daily.  90 tablet  3  . lisinopril (PRINIVIL,ZESTRIL) 10 MG tablet Take 1 tablet (10 mg total) by mouth daily.  30 tablet  3  . magnesium oxide (MAG-OX) 400 MG tablet Take 1 tablet (400 mg total) by mouth daily.  30 tablet  6  . metoprolol (LOPRESSOR) 25 MG tablet Take 0.5 tablets (12.5 mg total) by mouth 2 (two) times daily.  10 tablet  0  . pantoprazole (PROTONIX) 40 MG tablet Take 40 mg by mouth daily.        . rosuvastatin (CRESTOR) 20 MG tablet Take 20 mg by mouth at  bedtime.       . sitaGLIPtan-metformin (JANUMET) 50-1000 MG per tablet Take 1 tablet by mouth 2 (two) times daily with a meal.      . warfarin (COUMADIN) 7.5 MG tablet Take 7.5 mg by mouth daily. Currently taking 1/2 tab daily.       No current facility-administered medications for this visit.   Facility-Administered Medications Ordered in Other Visits  Medication Dose Route Frequency Provider Last Rate Last Dose  . metoprolol (LOPRESSOR) injection 5 mg  5 mg Intravenous Once Ward Givens, MD   5 mg at 06/04/11 1916    Past Medical History  Diagnosis Date  . Paroxysmal atrial fibrillation     onset in 2009; cardiac cath in 2001-50% circumflex; recurred 04/2010, 03/2011; ventricular rate controlled; normal coronary angiography in 2001; tachycardia mediated cardiomyopathy  . Hypertension     with hypertensive heart disease  . Hyperlipidemia     04/2008:220, 289, 43, 119  . Diabetes mellitus, type 2     No insulin  . Atrial flutter     radiofrequency ablation in 2002; no subsequent recurrence  . Obesity   . Chronic anticoagulation   . Tobacco abuse   .  Chronic obstructive pulmonary disease 04/20/2011    Past Surgical History  Procedure Date  . Atrial ablation surgery 2002    atrial flutter; subsequently developed atrial fibrillation  . Implant-ilr     Subsequently removed    Family History  Problem Relation Age of Onset  . Alzheimer's disease Mother   . Osteoporosis Mother     History   Social History  . Marital Status: Single    Spouse Name: N/A    Number of Children: 0  . Years of Education: N/A   Occupational History  . Unemployed    Social History Main Topics  . Smoking status: Former Smoker -- 0.5 packs/day for 42 years    Types: Cigarettes    Quit date: 04/17/2010  . Smokeless tobacco: Never Used  . Alcohol Use: No  . Drug Use: No  . Sexually Active: Not on file   Other Topics Concern  . Not on file   Social History Narrative   Unable to afford  medicationsResides with girlfriend    ROS:.Review of systems complete and found to be negative unless listed above  PHYSICAL EXAM BP 125/82  Pulse 77  Ht 6' (1.829 m)  Wt 234 lb (106.142 kg)  BMI 31.74 kg/m2  .General: Well developed, well nourished, in no acute distress Head: Eyes PERRLA, No xanthomas.   Normal cephalic and atramatic  Lungs: Clear bilaterally to auscultation and percussion. Heart: HRIR S1 S2, without MRG.  Pulses are 2+ & equal.            No carotid bruit. No JVD.  No abdominal bruits. No femoral bruits. Abdomen: Bowel sounds are positive, abdomen soft and non-tender without masses or                  Hernia's noted. Msk:  Back normal, normal gait. Normal strength and tone for age. Extremities: No clubbing, cyanosis or edema.  DP +1 Neuro: Alert and oriented X 3. Psych:  Good affect, responds appropriately  ZOX:[id number] strip: Coarse atrial fib/flutter rate of 89 bpm.  ASSESSMENT AND PLAN

## 2011-06-05 NOTE — Assessment & Plan Note (Signed)
Currently well controlled. No medication changes are necessary at this time.

## 2011-06-06 ENCOUNTER — Inpatient Hospital Stay (HOSPITAL_COMMUNITY)
Admission: AD | Admit: 2011-06-06 | Discharge: 2011-06-09 | DRG: 310 | Disposition: A | Discharge status: Home / Self Care | Payer: Medicaid Other | Source: Ambulatory Visit | Attending: Internal Medicine | Admitting: Internal Medicine

## 2011-06-06 ENCOUNTER — Other Ambulatory Visit: Payer: Self-pay

## 2011-06-06 ENCOUNTER — Institutional Professional Consult (permissible substitution): Payer: Medicaid Other | Admitting: Cardiology

## 2011-06-06 ENCOUNTER — Encounter (HOSPITAL_COMMUNITY): Payer: Self-pay | Admitting: Cardiology

## 2011-06-06 DIAGNOSIS — E1165 Type 2 diabetes mellitus with hyperglycemia: Secondary | ICD-10-CM | POA: Insufficient documentation

## 2011-06-06 DIAGNOSIS — I4891 Unspecified atrial fibrillation: Principal | ICD-10-CM | POA: Diagnosis present

## 2011-06-06 DIAGNOSIS — I4892 Unspecified atrial flutter: Secondary | ICD-10-CM | POA: Insufficient documentation

## 2011-06-06 DIAGNOSIS — Z7901 Long term (current) use of anticoagulants: Secondary | ICD-10-CM | POA: Insufficient documentation

## 2011-06-06 DIAGNOSIS — R0602 Shortness of breath: Secondary | ICD-10-CM

## 2011-06-06 DIAGNOSIS — J449 Chronic obstructive pulmonary disease, unspecified: Secondary | ICD-10-CM | POA: Insufficient documentation

## 2011-06-06 DIAGNOSIS — Z7982 Long term (current) use of aspirin: Secondary | ICD-10-CM

## 2011-06-06 DIAGNOSIS — Z79899 Other long term (current) drug therapy: Secondary | ICD-10-CM

## 2011-06-06 DIAGNOSIS — J4489 Other specified chronic obstructive pulmonary disease: Secondary | ICD-10-CM | POA: Diagnosis present

## 2011-06-06 DIAGNOSIS — E119 Type 2 diabetes mellitus without complications: Secondary | ICD-10-CM | POA: Insufficient documentation

## 2011-06-06 DIAGNOSIS — I10 Essential (primary) hypertension: Secondary | ICD-10-CM | POA: Insufficient documentation

## 2011-06-06 DIAGNOSIS — I480 Paroxysmal atrial fibrillation: Secondary | ICD-10-CM

## 2011-06-06 DIAGNOSIS — Z720 Tobacco use: Secondary | ICD-10-CM | POA: Insufficient documentation

## 2011-06-06 DIAGNOSIS — E669 Obesity, unspecified: Secondary | ICD-10-CM | POA: Diagnosis present

## 2011-06-06 DIAGNOSIS — K219 Gastro-esophageal reflux disease without esophagitis: Secondary | ICD-10-CM | POA: Insufficient documentation

## 2011-06-06 DIAGNOSIS — I4819 Other persistent atrial fibrillation: Secondary | ICD-10-CM | POA: Insufficient documentation

## 2011-06-06 DIAGNOSIS — E785 Hyperlipidemia, unspecified: Secondary | ICD-10-CM | POA: Insufficient documentation

## 2011-06-06 DIAGNOSIS — F17200 Nicotine dependence, unspecified, uncomplicated: Secondary | ICD-10-CM | POA: Diagnosis present

## 2011-06-06 HISTORY — DX: Shortness of breath: R06.02

## 2011-06-06 HISTORY — DX: Unspecified osteoarthritis, unspecified site: M19.90

## 2011-06-06 HISTORY — DX: Gastro-esophageal reflux disease without esophagitis: K21.9

## 2011-06-06 LAB — BASIC METABOLIC PANEL
BUN: 15 mg/dL (ref 6–23)
CO2: 25 mEq/L (ref 19–32)
Calcium: 9.6 mg/dL (ref 8.4–10.5)
Chloride: 102 mEq/L (ref 96–112)
Creatinine, Ser: 0.79 mg/dL (ref 0.50–1.35)
GFR calc Af Amer: >90 mL/min (ref >90)
GFR calc non Af Amer: >90 mL/min (ref >90)
Glucose, Bld: 182 mg/dL — ABNORMAL HIGH (ref 70–99)
Potassium: 4 mEq/L (ref 3.5–5.1)
Sodium: 139 mEq/L (ref 135–145)

## 2011-06-06 LAB — GLUCOSE, CAPILLARY
Glucose-Capillary: 166 mg/dL — ABNORMAL HIGH (ref 70–99)
Glucose-Capillary: 110 mg/dL — ABNORMAL HIGH (ref 70–99)

## 2011-06-06 LAB — MAGNESIUM
Magnesium: 1.4 mg/dL — ABNORMAL LOW (ref 1.5–2.5)
Magnesium: 1.8 mg/dL (ref 1.5–2.5)

## 2011-06-06 LAB — PROTIME-INR
INR: 2.54 — ABNORMAL HIGH (ref 0.00–1.49)
Prothrombin Time: 27.8 seconds — ABNORMAL HIGH (ref 11.6–15.2)

## 2011-06-06 LAB — DIGOXIN LEVEL: Digoxin Level: 1.3 ng/mL (ref 0.8–2.0)

## 2011-06-06 MED ORDER — MAGNESIUM SULFATE 40 MG/ML IJ SOLN
2.0000 g | Freq: Once | INTRAMUSCULAR | Status: AC
  Administered 2011-06-06: 2 g via INTRAVENOUS
  Filled 2011-06-06 (×2): qty 50

## 2011-06-06 MED ORDER — DILTIAZEM HCL ER BEADS 240 MG PO CP24: 360.0000 mg | ORAL_CAPSULE | Freq: Every day | ORAL | Status: DC

## 2011-06-06 MED ORDER — LINAGLIPTIN 5 MG PO TABS
5.0000 mg | ORAL_TABLET | Freq: Every day | ORAL | Status: DC
  Administered 2011-06-06 – 2011-06-09 (×4): 5 mg via ORAL
  Filled 2011-06-06 (×5): qty 1

## 2011-06-06 MED ORDER — ACETAMINOPHEN 325 MG PO TABS: 650.0000 mg | ORAL_TABLET | ORAL | Status: DC | PRN

## 2011-06-06 MED ORDER — MAGNESIUM OXIDE 400 MG PO TABS
400.0000 mg | ORAL_TABLET | Freq: Two times a day (BID) | ORAL | Status: DC
  Administered 2011-06-06 – 2011-06-09 (×6): 400 mg via ORAL
  Filled 2011-06-06 (×7): qty 1

## 2011-06-06 MED ORDER — BUPROPION HCL 100 MG PO TABS
100.0000 mg | ORAL_TABLET | Freq: Two times a day (BID) | ORAL | Status: DC
  Administered 2011-06-07 – 2011-06-09 (×5): 100 mg via ORAL
  Filled 2011-06-06 (×8): qty 1

## 2011-06-06 MED ORDER — CELECOXIB 200 MG PO CAPS
200.0000 mg | ORAL_CAPSULE | Freq: Every day | ORAL | Status: DC
  Administered 2011-06-07 – 2011-06-09 (×3): 200 mg via ORAL
  Filled 2011-06-06 (×4): qty 1

## 2011-06-06 MED ORDER — WARFARIN 1.25 MG HALF TABLET
3.7500 mg | ORAL_TABLET | Freq: Once | ORAL | Status: DC
  Filled 2011-06-06: qty 1

## 2011-06-06 MED ORDER — DIGOXIN 125 MCG PO TABS
0.1250 mg | ORAL_TABLET | Freq: Every day | ORAL | Status: DC
  Filled 2011-06-06: qty 1

## 2011-06-06 MED ORDER — OMEGA-3 FATTY ACIDS 1000 MG PO CAPS: 1.0000 g | ORAL_CAPSULE | Freq: Every day | ORAL | Status: DC

## 2011-06-06 MED ORDER — ROSUVASTATIN CALCIUM 20 MG PO TABS
20.0000 mg | ORAL_TABLET | Freq: Every day | ORAL | Status: DC
  Administered 2011-06-06 – 2011-06-08 (×3): 20 mg via ORAL
  Filled 2011-06-06 (×4): qty 1

## 2011-06-06 MED ORDER — FLUTICASONE-SALMETEROL 250-50 MCG/DOSE IN AEPB
2.0000 | INHALATION_SPRAY | Freq: Two times a day (BID) | RESPIRATORY_TRACT | Status: DC
  Administered 2011-06-06 – 2011-06-09 (×6): 2 via RESPIRATORY_TRACT
  Filled 2011-06-06: qty 14

## 2011-06-06 MED ORDER — SODIUM CHLORIDE 0.9 % IJ SOLN
3.0000 mL | Freq: Two times a day (BID) | INTRAMUSCULAR | Status: DC
  Administered 2011-06-06 – 2011-06-09 (×6): 3 mL via INTRAVENOUS

## 2011-06-06 MED ORDER — WARFARIN 1.25 MG HALF TABLET
3.7500 mg | ORAL_TABLET | Freq: Every day | ORAL | Status: DC
  Administered 2011-06-06 – 2011-06-08 (×3): 3.75 mg via ORAL
  Filled 2011-06-06 (×4): qty 1

## 2011-06-06 MED ORDER — NITROGLYCERIN 0.4 MG SL SUBL: 0.4000 mg | SUBLINGUAL_TABLET | SUBLINGUAL | Status: DC | PRN

## 2011-06-06 MED ORDER — ONDANSETRON HCL 4 MG/2ML IJ SOLN: 4.0000 mg | Freq: Four times a day (QID) | INTRAMUSCULAR | Status: DC | PRN

## 2011-06-06 MED ORDER — WARFARIN SODIUM 7.5 MG PO TABS: 7.5000 mg | ORAL_TABLET | Freq: Every day | ORAL | Status: DC

## 2011-06-06 MED ORDER — DOFETILIDE 500 MCG PO CAPS
500.0000 ug | ORAL_CAPSULE | Freq: Two times a day (BID) | ORAL | Status: DC
  Administered 2011-06-06 – 2011-06-09 (×6): 500 ug via ORAL
  Filled 2011-06-06 (×8): qty 1

## 2011-06-06 MED ORDER — LISINOPRIL 10 MG PO TABS
10.0000 mg | ORAL_TABLET | Freq: Every day | ORAL | Status: DC
  Administered 2011-06-06 – 2011-06-09 (×4): 10 mg via ORAL
  Filled 2011-06-06 (×4): qty 1

## 2011-06-06 MED ORDER — ASPIRIN EC 81 MG PO TBEC
81.0000 mg | DELAYED_RELEASE_TABLET | Freq: Every day | ORAL | Status: DC
  Administered 2011-06-07 – 2011-06-09 (×3): 81 mg via ORAL
  Filled 2011-06-06 (×4): qty 1

## 2011-06-06 MED ORDER — METOPROLOL TARTRATE 12.5 MG HALF TABLET
12.5000 mg | ORAL_TABLET | Freq: Two times a day (BID) | ORAL | Status: DC
  Administered 2011-06-06 – 2011-06-09 (×6): 12.5 mg via ORAL
  Filled 2011-06-06 (×8): qty 1

## 2011-06-06 MED ORDER — DILTIAZEM HCL ER COATED BEADS 360 MG PO CP24
360.0000 mg | ORAL_CAPSULE | Freq: Every day | ORAL | Status: DC
  Filled 2011-06-06 (×2): qty 1

## 2011-06-06 MED ORDER — POTASSIUM CHLORIDE CRYS ER 20 MEQ PO TBCR
20.0000 meq | EXTENDED_RELEASE_TABLET | Freq: Every day | ORAL | Status: DC
  Administered 2011-06-06 – 2011-06-09 (×4): 20 meq via ORAL
  Filled 2011-06-06: qty 1
  Filled 2011-06-06: qty 2
  Filled 2011-06-06: qty 1
  Filled 2011-06-06: qty 2
  Filled 2011-06-06 (×2): qty 1

## 2011-06-06 MED ORDER — ZOLPIDEM TARTRATE 5 MG PO TABS: 5.0000 mg | ORAL_TABLET | Freq: Every evening | ORAL | Status: DC | PRN

## 2011-06-06 MED ORDER — PANTOPRAZOLE SODIUM 40 MG PO TBEC
40.0000 mg | DELAYED_RELEASE_TABLET | Freq: Every day | ORAL | Status: DC
  Administered 2011-06-07 – 2011-06-09 (×3): 40 mg via ORAL
  Filled 2011-06-06 (×3): qty 1

## 2011-06-06 MED ORDER — OMEGA-3-ACID ETHYL ESTERS 1 G PO CAPS
1.0000 g | ORAL_CAPSULE | Freq: Every day | ORAL | Status: DC
  Administered 2011-06-07 – 2011-06-09 (×3): 1 g via ORAL
  Filled 2011-06-06 (×4): qty 1

## 2011-06-06 MED ORDER — FUROSEMIDE 20 MG PO TABS
20.0000 mg | ORAL_TABLET | Freq: Every day | ORAL | Status: DC
  Administered 2011-06-07 – 2011-06-09 (×3): 20 mg via ORAL
  Filled 2011-06-06 (×4): qty 1

## 2011-06-06 MED ORDER — INSULIN ASPART 100 UNIT/ML ~~LOC~~ SOLN
0.0000 [IU] | Freq: Three times a day (TID) | SUBCUTANEOUS | Status: DC
  Administered 2011-06-07: 3 [IU] via SUBCUTANEOUS
  Administered 2011-06-08: 2 [IU] via SUBCUTANEOUS
  Administered 2011-06-09 (×2): 3 [IU] via SUBCUTANEOUS
  Filled 2011-06-06: qty 3

## 2011-06-06 MED ORDER — SODIUM CHLORIDE 0.9 % IJ SOLN: 3.0000 mL | INTRAMUSCULAR | Status: DC | PRN

## 2011-06-06 MED ORDER — MAGNESIUM OXIDE 400 MG PO TABS
400.0000 mg | ORAL_TABLET | Freq: Every day | ORAL | Status: DC
  Filled 2011-06-06: qty 1

## 2011-06-06 MED ORDER — ALPRAZOLAM 0.25 MG PO TABS: 0.2500 mg | ORAL_TABLET | Freq: Two times a day (BID) | ORAL | Status: DC | PRN

## 2011-06-06 MED ORDER — DOFETILIDE 500 MCG PO CAPS
500.0000 ug | ORAL_CAPSULE | Freq: Two times a day (BID) | ORAL | Status: DC
  Filled 2011-06-06 (×2): qty 1

## 2011-06-06 MED ORDER — SODIUM CHLORIDE 0.9 % IV SOLN: 250.0000 mL | INTRAVENOUS | Status: DC | PRN

## 2011-06-06 MED ORDER — METFORMIN HCL 500 MG PO TABS
1000.0000 mg | ORAL_TABLET | Freq: Two times a day (BID) | ORAL | Status: DC
  Administered 2011-06-06 – 2011-06-09 (×6): 1000 mg via ORAL
  Filled 2011-06-06 (×8): qty 2

## 2011-06-06 MED ORDER — SITAGLIPTIN PHOS-METFORMIN HCL 50-1000 MG PO TABS: 1.0000 | ORAL_TABLET | Freq: Two times a day (BID) | ORAL | Status: DC

## 2011-06-06 MED ORDER — INSULIN ASPART 100 UNIT/ML ~~LOC~~ SOLN: 0.0000 [IU] | Freq: Every day | SUBCUTANEOUS | Status: DC

## 2011-06-06 NOTE — Progress Notes (Signed)
Addendum: [name] Cook was seen yesterday in the office and had admission for Tikosyn loading arranged as an outpatient. He is here today for Tikosyn. He continues to have SOB with minimal exertion and feels the palps but no presyncope or syncope. He is not having chest pain and is having no new symptoms.    Per [facility] note from 06-05-2011: [name] Cook is a 61 y/o patient of Dr.Rothbart we are seeing for ongoing assessment and treatment of Atrial fib, hypercholesterolemia, diabetes, hypertension. He had a DCCV per Dr.Rothbart on 05/31/11 that was unsuccessful for conversion to NSR and he remained in Afib. Metoprolol was discontinued and Cardizem was increased to 360 mg daily, he was to continue digoxin.. On 06/04/2011 he was seen in the ER with Afib RVR rate of 121 bpm. He was given 5 mg of IV lopressor and placed on metoprolol 12.5 mg BID. He comes today on follow up with complaints of continued fatigue. He denies chest pain or palpitations. He is continued on anticoagulation and is medically complaint.   No Known Allergies  Medications PTA Sig  . aspirin EC 81 MG tablet Take 81 mg by mouth every morning.    Marland Kitchen buPROPion (WELLBUTRIN) 100 MG tablet Take 100 mg by mouth 2 (two) times daily.  . celecoxib (CELEBREX) 200 MG capsule Take 200 mg by mouth daily.    . digoxin (LANOXIN) 0.25 MG tablet Take 250 mcg by mouth daily.  Marland Kitchen diltiazem (TIAZAC) 360 MG 24 hr capsule Take 1 capsule (360 mg total) by mouth daily.  . fish oil-omega-3 fatty acids 1000 MG capsule Take 1 g by mouth daily.    . Fluticasone-Salmeterol (ADVAIR) 250-50 MCG/DOSE AEPB Inhale 2 puffs into the lungs 2 (two) times daily. With spacer  . furosemide (LASIX) 20 MG tablet Take 1 tablet (20 mg total) by mouth daily.  Marland Kitchen lisinopril (PRINIVIL,ZESTRIL) 10 MG tablet Take 1 tablet (10 mg total) by mouth daily.  . magnesium oxide (MAG-OX) 400 MG tablet Take 1 tablet (400 mg total) by mouth daily.  . metoprolol (LOPRESSOR) 25 MG tablet Take 0.5 tablets  (12.5 mg total) by mouth 2 (two) times daily.  . pantoprazole (PROTONIX) 40 MG tablet Take 40 mg by mouth daily.    . rosuvastatin (CRESTOR) 20 MG tablet Take 20 mg by mouth at bedtime.   . sitaGLIPtan-metformin (JANUMET) 50-1000 MG per tablet Take 1 tablet by mouth 2 (two) times daily with a meal.  . warfarin (COUMADIN) 7.5 MG tablet Take 7.5 mg by mouth daily. Currently taking 1/2 tab daily.     Scheduled Meds: Here  . aspirin EC  81 mg Oral Daily  . buPROPion  100 mg Oral BID  . celecoxib  200 mg Oral Daily  . diltiazem  360 mg Oral Daily  . Fluticasone-Salmeterol  2 puff Inhalation BID  . furosemide  20 mg Oral Daily  . insulin aspart  0-15 Units Subcutaneous TID WC  . insulin aspart  0-5 Units Subcutaneous QHS  . linagliptin  5 mg Oral Daily  . lisinopril  10 mg Oral Daily  . magnesium oxide  400 mg Oral Daily  . metFORMIN  1,000 mg Oral BID WC  . metoprolol  12.5 mg Oral BID  . omega-3 acid ethyl esters  1 g Oral Daily  . pantoprazole  40 mg Oral Daily  . rosuvastatin  20 mg Oral QHS  . sodium chloride  3 mL Intravenous Q12H  . warfarin  3.75 mg Oral q1800  PRN Meds:.sodium chloride, acetaminophen, ALPRAZolam, nitroGLYCERIN, ondansetron (ZOFRAN) IV, sodium chloride, zolpidem   Past Medical History  Diagnosis Date  . Paroxysmal atrial fibrillation     onset in 2009; cardiac cath in 2001-50% circumflex; recurred 04/2010, 03/2011; ventricular rate controlled; normal coronary angiography in 2001; tachycardia mediated cardiomyopathy  . Hypertension     with hypertensive heart disease  . Hyperlipidemia     04/2008:220, 289, 43, 119  . Atrial flutter     radiofrequency ablation in 2002; no subsequent recurrence  . Obesity   . Chronic anticoagulation   . Tobacco abuse   . Chronic obstructive pulmonary disease 04/20/2011  . Shortness of breath   . GERD (gastroesophageal reflux disease)   . Arthritis   . COPD (chronic obstructive pulmonary disease)   . Diabetes mellitus,  type 2     No insulin    Past Surgical History  Procedure Date  . Atrial ablation surgery 2002    atrial flutter; subsequently developed atrial fibrillation  . Implant-ilr     Subsequently removed  . Cardioversion 05/31/2011    Procedure: CARDIOVERSION;  Surgeon: [name]. [name], MD;  Location: [facility];  Service: Cardiovascular    Family History  Problem Relation Age of Onset  . Alzheimer's disease Mother   . Osteoporosis Mother     History   Social History  . Marital Status: Single    Spouse Name: N/A    Number of Children: 0  . Years of Education: N/A   Occupational History  . Unemployed Curator    Social History Main Topics  . Smoking status: Former Smoker -- 0.5 packs/day for 42 years    Types: Cigarettes    Quit date: 04/18/2011  . Smokeless tobacco: Never Used  . Alcohol Use: No  . Drug Use: No  . Sexually Active: Yes   Social History Narrative   Has medicaid so can get meds.Resides with girlfriend    ROS:Pt doing well, no acute problems or illness. 14 point review of systems complete and found to be negative unless listed above.  PHYSICAL EXAM BP 149/84  Pulse 80  Temp(Src) 97.4 F (36.3 C) (Oral)  Resp 20  Ht 6' (1.829 m)  Wt 225 lb 15.5 oz (102.5 kg)  BMI 30.65 kg/m2  SpO2 98%  .General: Well developed, well nourished male in no acute distress Head: Eyes PERRLA, No xanthomas.  Normocephalic and atraumatic, dentition OK  Lungs: Clear bilaterally to auscultation and percussion. Heart: Irregular rate and rhythm, S1 S2, without MRG.  Pulses are 2+ & equal. No carotid bruits. No JVD.   Abdomen: Bowel sounds are present, abdomen soft and non-tender without masses or hernias noted. Msk: No joint effusions or deformities, normal gait. Normal strength and tone for age. Extremities: No clubbing, cyanosis or edema.  DP 2+ in all 4 extrem Neuro: Alert and oriented X 3. Skin: No rashes or lesions noted Psych:  Good affect, responds appropriately  Lab  Results  Component Value Date   WBC 5.6 06/04/2011   HGB 14.0 06/04/2011   HCT 40.9 06/04/2011   MCV 90.9 06/04/2011   PLT 145* 06/04/2011   Lab Results  Component Value Date   DIGOXIN 1.3 06/06/2011    Lab 06/06/11 1233  NA 139  K 4.0  CL 102  CO2 25  BUN 15  CREATININE 0.79  GLUCOSE 182*   Magnesium  Date Value Range Status  04/01/2011 1.9  1.5-2.5 (mg/dL) Final   INR   Date/Time  Value  Range  Status   06/06/2011 2.54  final  06/05/2011 2:30 PM  3.0   Final   06/04/2011 7:03 PM  3.33*  0.00-1.49 (no units)  Final   05/29/2011 3:02 PM  3.8   Final   05/22/2011 3:03 PM  2.6   Final   05/07/2011 5:14 PM  2.10*  0.00-1.49 (no units)  Final   05/05/2011 4:11 PM  2.5   Final   04/27/2011 2:47 PM  3.1   Final   04/24/2011 11:14 AM  1.8   Final   04/04/2011 9:04 AM  1.74*  0.00-1.49 (no units)  Final     EKG:06-Jun-2011 12:06:43 Read as Atrial fibrillation with rapid ventricular response but think atrial flutter Nonspecific ST and T wave abnormality Abnormal ECG Vent. rate 131 BPM PR interval * ms QRS duration 96 ms QT/QTc 314/463 ms P-R-T axes * 64 -60  Rhytm strip: Coarse atrial fib/flutter rate of 89 bpm.  ASSESSMENT AND PLAN 1. Atrial flutter: pt here for Tikosyn load, f/u on Mg, other labs OK but will supp K+ and Mg, decrease dig. Start at 500 mcg Q 12 hours and follow QTc.  2. Anticoag: INR therapeutic since 04/27/2011, MD advise on DCCV once Tikosyn loaded.   3. Otherwise - continue home Rx, add SSI. Case manager consult to obtain Tikosyn at d/c.  Theodore Demark 06/06/2011 2:54 PM  Cardiology Attending  Patient seen and examined. Reviewed with Dr. [name] and Lavella Hammock. Will admit and begin Tikosyn. His QTC is 460 but I suspect this is falsely elevated due to the computer's reading while in atrial fib. If he does not convert to NSR spontaneously, will plan DCCV on 2/22.

## 2011-06-06 NOTE — Progress Notes (Signed)
[name] Cook has a DCCV scheduled on Friday, February 22, at 10 am with anesthesia. The booking number [place] [id number].

## 2011-06-07 ENCOUNTER — Other Ambulatory Visit: Payer: Self-pay

## 2011-06-07 LAB — BASIC METABOLIC PANEL
BUN: 14 mg/dL (ref 6–23)
CO2: 26 mEq/L (ref 19–32)
Calcium: 9.9 mg/dL (ref 8.4–10.5)
Chloride: 104 mEq/L (ref 96–112)
Creatinine, Ser: 0.76 mg/dL (ref 0.50–1.35)
GFR calc Af Amer: >90 mL/min (ref >90)
GFR calc non Af Amer: >90 mL/min (ref >90)
Glucose, Bld: 144 mg/dL — ABNORMAL HIGH (ref 70–99)
Potassium: 3.5 mEq/L (ref 3.5–5.1)
Sodium: 140 mEq/L (ref 135–145)

## 2011-06-07 LAB — GLUCOSE, CAPILLARY
Glucose-Capillary: 157 mg/dL — ABNORMAL HIGH (ref 70–99)
Glucose-Capillary: 140 mg/dL — ABNORMAL HIGH (ref 70–99)
Glucose-Capillary: 140 mg/dL — ABNORMAL HIGH (ref 70–99)
Glucose-Capillary: 104 mg/dL — ABNORMAL HIGH (ref 70–99)

## 2011-06-07 LAB — MAGNESIUM: Magnesium: 1.8 mg/dL (ref 1.5–2.5)

## 2011-06-07 LAB — PROTIME-INR
INR: 3.03 — ABNORMAL HIGH (ref 0.00–1.49)
Prothrombin Time: 31.9 seconds — ABNORMAL HIGH (ref 11.6–15.2)

## 2011-06-07 MED ORDER — POTASSIUM CHLORIDE 20 MEQ/15ML (10%) PO LIQD: 40.0000 meq | Freq: Every day | ORAL | Status: DC

## 2011-06-07 MED ORDER — POTASSIUM CHLORIDE CRYS ER 20 MEQ PO TBCR
40.0000 meq | EXTENDED_RELEASE_TABLET | Freq: Once | ORAL | Status: AC
  Administered 2011-06-07: 40 meq via ORAL

## 2011-06-07 MED ORDER — DILTIAZEM HCL ER COATED BEADS 360 MG PO CP24
360.0000 mg | ORAL_CAPSULE | Freq: Every day | ORAL | Status: DC
  Administered 2011-06-07 – 2011-06-09 (×3): 360 mg via ORAL
  Filled 2011-06-07 (×2): qty 1

## 2011-06-07 NOTE — Progress Notes (Signed)
Pt. Watch Tikosyn video

## 2011-06-07 NOTE — Progress Notes (Signed)
K+ 3.5, verbal order from MD to admin tikosyn and 40 meq KCL PO.  Gave tablets from pyxis, not liquid, pharm notified.

## 2011-06-07 NOTE — Progress Notes (Signed)
UR Completed. Simmons, Aysen Shieh F [phone]  

## 2011-06-07 NOTE — Progress Notes (Signed)
Patient lying in bed alert and orin. With no comp. Skin w/d hr. at fib. 140-150 bp 128/68 . [name] Cook [facility] called see orders to give this a.m. Cardizem now. Enc. Patient to stay in bed and [place] urinal til h.r. Comes down. O2 2 l/m n.c. Applied . Cont. To monitor pt. And rhythm.

## 2011-06-07 NOTE — Progress Notes (Signed)
   CARE MANAGEMENT NOTE 06/07/2011  Patient:  JARMARCUS, WAMBOLD   Account Number:  [id number]  Date Initiated:  06/07/2011  Documentation initiated by:  GRAVES-BIGELOW,Houston Zapien  Subjective/Objective Assessment:   Pt admitted for Tikosyn Load. Pt is on medicaid and he gets medications from [place] in [place]. CM did call pharmacy and they can order medications.     Action/Plan:   MD please call for prior authorization of medicaiton: [phone] medicaid # [id number] M. MD please write rx for 7 day supply no refills to be filled in main pharmacy and then the original rx with refills. Price will be 3.00 once called.   Anticipated DC Date:  06/09/2011   Anticipated DC Plan:  HOME/SELF CARE      DC Planning Services  CM consult      Choice offered to / List presented to:             Status of service:  Completed, signed off Medicare Important Message given?   (If response is "NO", the following Medicare IM given date fields will be blank) Date Medicare IM given:   Date Additional Medicare IM given:    Discharge Disposition:  HOME/SELF CARE  Per UR Regulation:    Comments:  06-07-11 1135 Tomi Bamberger, RN,BSN [phone] Staff RN please call Care Manager for assistance with Tikosyn once rx has been written.

## 2011-06-07 NOTE — Progress Notes (Signed)
Patient ID: [name] Cook, male   DOB: February 24, 1951, 61 y.o.   MRN: [id number] Subjective:  No chest pain or sob Objective:  Vital Signs in the last 24 hours: Temp:  [97.4 F (36.3 C)-97.9 F (36.6 C)] 97.6 F (36.4 C) (02/20 0553) Pulse Rate:  [78-130] 130  (02/20 0617) Resp:  [18-20] 20  (02/20 0617) BP: (115-149)/(68-84) 128/68 mmHg (02/20 0617) SpO2:  [96 %-98 %] 97 % (02/20 0553) Weight:  [102.5 kg (225 lb 15.5 oz)-103.7 kg (228 lb 9.9 oz)] 103.7 kg (228 lb 9.9 oz) (02/20 0553)  Intake/Output from previous day: 02/19 0701 - 02/20 0700 In: 1680 [P.O.:1680] Out: 3602 [Urine:3602] Intake/Output from this shift:    Physical Exam: Well appearing middle aged man, NAD HEENT: Unremarkable Neck:  No JVD, no thyromegally Lymphatics:  No adenopathy Back:  No CVA tenderness Lungs:  Clear with no wheezes HEART:  IRegular rate rhythm, no murmurs, no rubs, no clicks Abd:  Flat, positive bowel sounds, no organomegally, no rebound, no guarding Ext:  2 plus pulses, no edema, no cyanosis, no clubbing Skin:  No rashes no nodules Neuro:  CN II through XII intact, motor grossly intact  Lab Results:  [facility] 06/04/11 1903  WBC 5.6  HGB 14.0  PLT 145*    Basename 06/07/11 0511 06/06/11 1233  NA 140 139  K 3.5 4.0  CL 104 102  CO2 26 25  GLUCOSE 144* 182*  BUN 14 15  CREATININE 0.76 0.79   No results found for this basename: TROPONINI:2,CK,MB:2 in the last 72 hours Hepatic Function Panel No results found for this basename: PROT,ALBUMIN,AST,ALT,ALKPHOS,BILITOT,BILIDIR,IBILI in the last 72 hours No results found for this basename: CHOL in the last 72 hours No results found for this basename: PROTIME in the last 72 hours  Imaging: No results found.  Cardiac Studies: Tele - atrial fib with a rvr/cvr. 12 lead ECG - atrial fib with a CVR. QTc not prolonged Assessment/Plan:  1. Atrial fib with an RVR - he is tolerating Tikosyn. He remains in Atrial fib. Will plan DCCV on 06/08/11 or  06/09/11. 2. CAD - stable. This is why we decided on Tikosyn as flecainide/propafenone contra-indicated. 3. Hypokalemia - he has been given 40 meq KCL. Will give 20 meq more later today. [name], M.D.  LOS: 1 day    [name] 06/07/2011, 11:34 AM

## 2011-06-08 ENCOUNTER — Other Ambulatory Visit: Payer: Self-pay

## 2011-06-08 ENCOUNTER — Encounter (HOSPITAL_COMMUNITY): Payer: Self-pay | Admitting: Anesthesiology

## 2011-06-08 LAB — COMPREHENSIVE METABOLIC PANEL
ALT: 29 U/L (ref 0–53)
AST: 19 U/L (ref 0–37)
Albumin: 3.8 g/dL (ref 3.5–5.2)
Alkaline Phosphatase: 65 U/L (ref 39–117)
BUN: 12 mg/dL (ref 6–23)
CO2: 27 mEq/L (ref 19–32)
Calcium: 10.2 mg/dL (ref 8.4–10.5)
Chloride: 101 mEq/L (ref 96–112)
Creatinine, Ser: 0.82 mg/dL (ref 0.50–1.35)
GFR calc Af Amer: >90 mL/min (ref >90)
GFR calc non Af Amer: >90 mL/min (ref >90)
Glucose, Bld: 102 mg/dL — ABNORMAL HIGH (ref 70–99)
Potassium: 4 mEq/L (ref 3.5–5.1)
Sodium: 139 mEq/L (ref 135–145)
Total Bilirubin: 0.3 mg/dL (ref 0.3–1.2)
Total Protein: 6.6 g/dL (ref 6.0–8.3)

## 2011-06-08 LAB — GLUCOSE, CAPILLARY
Glucose-Capillary: 148 mg/dL — ABNORMAL HIGH (ref 70–99)
Glucose-Capillary: 203 mg/dL — ABNORMAL HIGH (ref 70–99)
Glucose-Capillary: 120 mg/dL — ABNORMAL HIGH (ref 70–99)
Glucose-Capillary: 97 mg/dL (ref 70–99)

## 2011-06-08 LAB — CBC
HCT: 38.7 % — ABNORMAL LOW (ref 39.0–52.0)
Hemoglobin: 13.4 g/dL (ref 13.0–17.0)
MCH: 31.5 pg (ref 26.0–34.0)
MCHC: 34.6 g/dL (ref 30.0–36.0)
MCV: 91.1 fL (ref 78.0–100.0)
Platelets: 145 10*3/uL — ABNORMAL LOW (ref 150–400)
RBC: 4.25 MIL/uL (ref 4.22–5.81)
RDW: 14.1 % (ref 11.5–15.5)
WBC: 7 10*3/uL (ref 4.0–10.5)

## 2011-06-08 LAB — PROTIME-INR
INR: 2.85 — ABNORMAL HIGH (ref 0.00–1.49)
Prothrombin Time: 30.4 seconds — ABNORMAL HIGH (ref 11.6–15.2)

## 2011-06-08 LAB — MAGNESIUM: Magnesium: 1.9 mg/dL (ref 1.5–2.5)

## 2011-06-08 MED ORDER — MIDAZOLAM HCL 2 MG/2ML IJ SOLN: 0.5000 mg | INTRAMUSCULAR | Status: DC | PRN

## 2011-06-08 MED ORDER — FENTANYL CITRATE 0.05 MG/ML IJ SOLN: 50.0000 ug | INTRAMUSCULAR | Status: DC | PRN

## 2011-06-08 NOTE — Progress Notes (Signed)
    Subjective:  Feels fine. No chest pain or dyspnea.  Objective:  Vital Signs in the last 24 hours: Temp:  [97.8 F (36.6 C)-98.3 F (36.8 C)] 97.8 F (36.6 C) (02/21 0601) Pulse Rate:  [72-102] 76  (02/21 0601) Resp:  [19-20] 19  (02/21 0601) BP: (108-134)/(72-82) 115/72 mmHg (02/21 0601) SpO2:  [94 %-98 %] 94 % (02/21 0601) Weight:  [102.7 kg (226 lb 6.6 oz)] 102.7 kg (226 lb 6.6 oz) (02/21 0601)  Intake/Output from previous day: 02/20 0701 - 02/21 0700 In: 1680 [P.O.:1680] Out: 3401 [Urine:3400; Stool:1]  Physical Exam: Pt is alert and oriented, NAD HEENT: normal Neck: JVP - normal Lungs: CTA bilaterally CV: RRR without murmur or gallop Abd: soft, NT, Positive BS, no hepatomegaly Ext: no C/C/E Skin: warm/dry no rash   Lab Results: No results found for this basename: WBC:2,HGB:2,PLT:2 in the last 72 hours  Basename 06/07/11 0511 06/06/11 1233  NA 140 139  K 3.5 4.0  CL 104 102  CO2 26 25  GLUCOSE 144* 182*  BUN 14 15  CREATININE 0.76 0.79   No results found for this basename: TROPONINI:2,CK,MB:2 in the last 72 hours  Tele: sinus rhythm  Assessment/Plan:  1. Atrial fib with an RVR - he is tolerating Tikosyn. He has converted to NSR. QTC - mildly prolonged but stable. Hard to compare to [facility] when he was in AF. Follow ECG through total of 6 doses will be eligible for discharge tomorrow afternoon. 2. CAD - stable. This is why we decided on Tikosyn as flecainide/propafenone contra-indicated.  3. Hypokalemia - on 20 meq KDur daily. Will recheck potassium in the AM.  Tonny Bollman, M.D. 06/08/2011, 10:17 AM

## 2011-06-09 ENCOUNTER — Other Ambulatory Visit: Payer: Self-pay

## 2011-06-09 ENCOUNTER — Encounter (HOSPITAL_COMMUNITY): Payer: Self-pay | Admitting: Nurse Practitioner

## 2011-06-09 ENCOUNTER — Encounter (HOSPITAL_COMMUNITY)
Admission: AD | Disposition: A | Discharge status: Home / Self Care | Payer: Self-pay | Source: Ambulatory Visit | Attending: Internal Medicine

## 2011-06-09 LAB — GLUCOSE, CAPILLARY
Glucose-Capillary: 162 mg/dL — ABNORMAL HIGH (ref 70–99)
Glucose-Capillary: 176 mg/dL — ABNORMAL HIGH (ref 70–99)

## 2011-06-09 LAB — BASIC METABOLIC PANEL
BUN: 12 mg/dL (ref 6–23)
CO2: 25 mEq/L (ref 19–32)
Calcium: 10.1 mg/dL (ref 8.4–10.5)
Chloride: 101 mEq/L (ref 96–112)
Creatinine, Ser: 0.73 mg/dL (ref 0.50–1.35)
GFR calc Af Amer: >90 mL/min (ref >90)
GFR calc non Af Amer: >90 mL/min (ref >90)
Glucose, Bld: 158 mg/dL — ABNORMAL HIGH (ref 70–99)
Potassium: 4 mEq/L (ref 3.5–5.1)
Sodium: 139 mEq/L (ref 135–145)

## 2011-06-09 LAB — MAGNESIUM: Magnesium: 1.8 mg/dL (ref 1.5–2.5)

## 2011-06-09 LAB — PROTIME-INR
INR: 2.67 — ABNORMAL HIGH (ref 0.00–1.49)
Prothrombin Time: 28.9 seconds — ABNORMAL HIGH (ref 11.6–15.2)

## 2011-06-09 SURGERY — CARDIOVERSION
Anesthesia: Monitor Anesthesia Care

## 2011-06-09 MED ORDER — POTASSIUM CHLORIDE CRYS ER 20 MEQ PO TBCR: 20.0000 meq | EXTENDED_RELEASE_TABLET | Freq: Every day | ORAL | Status: DC

## 2011-06-09 MED ORDER — DOFETILIDE 500 MCG PO CAPS: 500.0000 ug | ORAL_CAPSULE | Freq: Two times a day (BID) | ORAL | Status: DC

## 2011-06-09 MED ORDER — NITROGLYCERIN 0.4 MG SL SUBL: 0.4000 mg | SUBLINGUAL_TABLET | SUBLINGUAL | Status: DC | PRN

## 2011-06-09 NOTE — Discharge Summary (Signed)
[name] Cook 06/09/2011 See progress note from D/C day

## 2011-06-09 NOTE — Progress Notes (Signed)
Patient Name: [name] Cook Date of Encounter: 06/09/2011     Principal Problem:  *Paroxysmal atrial fibrillation Active Problems:  GASTROESOPHAGEAL REFLUX DISEASE  Chronic anticoagulation  Hypertension  Hyperlipidemia  Diabetes mellitus, type 2  Atrial flutter  Obesity  Tobacco abuse  Chronic obstructive pulmonary disease    SUBJECTIVE  Maintaining sinus.  Feels "good."  No c/p, sob.  QTc 453 after tikosyn last night.  CURRENT MEDS    . aspirin EC  81 mg Oral Daily  . buPROPion  100 mg Oral BID  . celecoxib  200 mg Oral Daily  . diltiazem  360 mg Oral Daily  . dofetilide  500 mcg Oral Q12H  . Fluticasone-Salmeterol  2 puff Inhalation BID  . furosemide  20 mg Oral Daily  . insulin aspart  0-15 Units Subcutaneous TID WC  . insulin aspart  0-5 Units Subcutaneous QHS  . linagliptin  5 mg Oral Daily  . lisinopril  10 mg Oral Daily  . magnesium oxide  400 mg Oral BID  . metFORMIN  1,000 mg Oral BID WC  . metoprolol  12.5 mg Oral BID  . omega-3 acid ethyl esters  1 g Oral Daily  . pantoprazole  40 mg Oral Daily  . potassium chloride  20 mEq Oral Daily  . rosuvastatin  20 mg Oral QHS  . sodium chloride  3 mL Intravenous Q12H  . warfarin  3.75 mg Oral q1800    OBJECTIVE  Filed Vitals:   06/08/11 1355 06/08/11 2051 06/08/11 2052 06/09/11 0435  BP: 132/78  130/78 112/71  Pulse: 71  74 78  Temp: 97.3 F (36.3 C)  97.9 F (36.6 C) 97.7 F (36.5 C)  TempSrc: Oral  Oral Oral  Resp: 20  20 19   Height:      Weight:      SpO2: 96% 96% 96% 95%    Intake/Output Summary (Last 24 hours) at 06/09/11 0742 Last data filed at 06/09/11 0435  Gross per 24 hour  Intake   1683 ml  Output   4400 ml  Net  -2717 ml   Filed Weights   06/06/11 1145 06/07/11 0553 06/08/11 0601  Weight: 225 lb 15.5 oz (102.5 kg) 228 lb 9.9 oz (103.7 kg) 226 lb 6.6 oz (102.7 kg)    PHYSICAL EXAM  General: Pleasant, NAD. Neuro: Alert and oriented X 3. Moves all extremities  spontaneously. Psych: Normal affect. HEENT:  Normal  Neck: Supple without bruits or JVD. Lungs:  Resp regular and unlabored, CTA. Heart: RRR no s3, s4, or murmurs. Abdomen: Soft, non-tender, non-distended, BS + x 4.  Extremities: No clubbing, cyanosis or edema. DP/PT/Radials 2+ and equal bilaterally.  Accessory Clinical Findings  CBC  Basename 06/08/11 2043  WBC 7.0  NEUTROABS --  HGB 13.4  HCT 38.7*  MCV 91.1  PLT 145*   Basic Metabolic Panel  Basename 06/09/11 0501 06/08/11 2043 06/08/11 0638  NA 139 139 --  K 4.0 4.0 --  CL 101 101 --  CO2 25 27 --  GLUCOSE 158* 102* --  BUN 12 12 --  CREATININE 0.73 0.82 --  CALCIUM 10.1 10.2 --  MG 1.8 -- 1.9  PHOS -- -- --   Liver Function Tests  [facility] 06/08/11 2043  AST 19  ALT 29  ALKPHOS 65  BILITOT 0.3  PROT 6.6  ALBUMIN 3.8   TELE  Sinus rhythm, pac's  ECG  Sinus, pac's.  qtc - 453  ASSESSMENT AND PLAN  1.  PAF:  S/p tikosyn initiation.  Has received 5 doses - 6th this am.  QTc after last night's dose = 453 msec - stable.  Repeat ecg 2 hrs after this am's dose.  If stable, d/c on current dose.  INR therapeutic.  Case mgmt seeing.  Prior authorization already obtained per CM notes.  2. HTN:  Stable.  3.  CAD:  No chest pain.  Cont asa, bb, statin, acei.  Should d/c celebrex.  4.  Hypokalemia:  Stable.  5.  DM:  Cont home meds.  Signed, Nicolasa Ducking NP  Patient examined and chart reviewed.  Patient converted to NSR. QT  453 no side effects.  7 day supply on chart D/C home today Charlton Haws 1:25 PM 06/09/2011

## 2011-06-09 NOTE — Discharge Summary (Signed)
Patient ID: [name] Cook,  MRN: [id number], DOB/AGE: 59-Jun-1952 59 y.o.  Admit date: 06/06/2011 Discharge date: 06/09/2011  Primary Care Provider: Evelene Croon, MD Primary Cardiologist: R. Rothbart  Discharge Diagnoses Principal Problem:  *Paroxysmal atrial fibrillation Active Problems:  GASTROESOPHAGEAL REFLUX DISEASE  Chronic anticoagulation  Hypertension  Hyperlipidemia  Diabetes mellitus, type 2  Atrial flutter  Obesity  Tobacco abuse  Chronic obstructive pulmonary disease   Allergies No Known Allergies  Procedures  None  History of Present Illness  61 y/o male with the above problem list.  He recently underwent DCCV for PAF on 05/31/11, which unfortunately was unsuccessful.  Due to elevated rates and dyspnea, pt was seen in the ER on 2/17 and subsequently was seen in the office on 2/18 @ which point it was determined that pt would require antiarrhythmic therapy initiation.  Because of his history of CAD, decision was made to pursue Tikosyn loading (rather than flecainide) and arrangements were made for admission.  Hospital Course  Patient was admitted on 2/19 for Tikosyn loading.  He was in a.fib on arrival.  With a normal creatinine clearance, he was placed on 500 mcg q 12 hours.  His home dose of coumadin was continued.  He converted to sinus rhythm on 2/20 and has had no further a.fib.  He has completed 6 doses, and his QTc remains stable @ 448 msec.  We plan to discharge him home today in good condition.  Discharge Vitals Blood pressure 112/71, pulse 78, temperature 97.7 F (36.5 C), temperature source Oral, resp. rate 19, height 6' (1.829 m), weight 226 lb 6.6 oz (102.7 kg), SpO2 97.00%.  Filed Weights   06/06/11 1145 06/07/11 0553 06/08/11 0601  Weight: 225 lb 15.5 oz (102.5 kg) 228 lb 9.9 oz (103.7 kg) 226 lb 6.6 oz (102.7 kg)    Labs  CBC  Basename 06/08/11 2043  WBC 7.0  NEUTROABS --  HGB 13.4  HCT 38.7*  MCV 91.1  PLT 145*   Basic Metabolic  Panel  Basename 06/09/11 0501 06/08/11 2043 06/08/11 0638  NA 139 139 --  K 4.0 4.0 --  CL 101 101 --  CO2 25 27 --  GLUCOSE 158* 102* --  BUN 12 12 --  CREATININE 0.73 0.82 --  CALCIUM 10.1 10.2 --  MG 1.8 -- 1.9  PHOS -- -- --   Liver Function Tests  [facility] 06/08/11 2043  AST 19  ALT 29  ALKPHOS 65  BILITOT 0.3  PROT 6.6  ALBUMIN 3.8   Disposition  Pt is being discharged home today in good condition.  Follow-up Plans & Appointments  Follow-up Information    Follow up with [facility] on 06/15/2011. (2:40 PM)       Follow up with Califon Bing, MD on 06/15/2011. (3:00 PM)    Contact information:   [address] [place] [address] [phone]       Follow up with Evelene Croon, MD. (As needed)    Contact information:   [facility] [place] [address] [phone]          Discharge Medications  Medication List  As of 06/09/2011 12:32 PM   STOP taking these medications         digoxin 0.25 MG tablet         TAKE these medications         aspirin EC 81 MG tablet   Take 81 mg by mouth every morning.  celecoxib 200 MG capsule   Commonly known as: CELEBREX   Take 200 mg by mouth daily.      diltiazem 360 MG 24 hr capsule   Commonly known as: TIAZAC   Take 1 capsule (360 mg total) by mouth daily.      dofetilide 500 MCG capsule   Commonly known as: TIKOSYN   Take 1 capsule (500 mcg total) by mouth every 12 (twelve) hours.      fish oil-omega-3 fatty acids 1000 MG capsule   Take 1 g by mouth daily.      Fluticasone-Salmeterol 250-50 MCG/DOSE Aepb   Commonly known as: ADVAIR   Inhale 2 puffs into the lungs 2 (two) times daily. With spacer      furosemide 20 MG tablet   Commonly known as: LASIX   Take 1 tablet (20 mg total) by mouth daily.      lisinopril 10 MG tablet   Commonly known as: PRINIVIL,ZESTRIL   Take 1 tablet (10 mg total) by mouth daily.      magnesium oxide 400 MG  tablet   Commonly known as: MAG-OX   Take 1 tablet (400 mg total) by mouth daily.      metoprolol tartrate 25 MG tablet   Commonly known as: LOPRESSOR   Take 0.5 tablets (12.5 mg total) by mouth 2 (two) times daily.      nitroGLYCERIN 0.4 MG SL tablet   Commonly known as: NITROSTAT   Place 1 tablet (0.4 mg total) under the tongue every 5 (five) minutes x 3 doses as needed for chest pain.      pantoprazole 40 MG tablet   Commonly known as: PROTONIX   Take 40 mg by mouth daily.      potassium chloride SA 20 MEQ tablet   Commonly known as: K-DUR,KLOR-CON   Take 1 tablet (20 mEq total) by mouth daily.      rosuvastatin 20 MG tablet   Commonly known as: CRESTOR   Take 20 mg by mouth at bedtime.      sitaGLIPtan-metformin 50-1000 MG per tablet   Commonly known as: JANUMET   Take 1 tablet by mouth 2 (two) times daily with a meal.      warfarin 7.5 MG tablet   Commonly known as: COUMADIN   Take 3.75 mg by mouth daily.      WELLBUTRIN 100 MG tablet   Generic drug: buPROPion   Take 100 mg by mouth 2 (two) times daily.            Outstanding Labs/Studies  F/u INR next week  Duration of Discharge Encounter   Greater than 30 minutes including physician time.  Signed, Nicolasa Ducking NP 06/09/2011, 12:32 PM

## 2011-06-12 ENCOUNTER — Encounter: Payer: Medicaid Other | Admitting: *Deleted

## 2011-06-15 ENCOUNTER — Encounter: Payer: Self-pay | Admitting: Cardiology

## 2011-06-15 ENCOUNTER — Ambulatory Visit (INDEPENDENT_AMBULATORY_CARE_PROVIDER_SITE_OTHER): Payer: Medicaid Other | Admitting: Cardiology

## 2011-06-15 ENCOUNTER — Ambulatory Visit (INDEPENDENT_AMBULATORY_CARE_PROVIDER_SITE_OTHER): Payer: Medicaid Other | Admitting: *Deleted

## 2011-06-15 VITALS — BP 109/66 | HR 85 | Ht 72.0 in | Wt 228.0 lb

## 2011-06-15 DIAGNOSIS — Z7901 Long term (current) use of anticoagulants: Secondary | ICD-10-CM

## 2011-06-15 DIAGNOSIS — I10 Essential (primary) hypertension: Secondary | ICD-10-CM

## 2011-06-15 DIAGNOSIS — I4892 Unspecified atrial flutter: Secondary | ICD-10-CM

## 2011-06-15 DIAGNOSIS — I4891 Unspecified atrial fibrillation: Secondary | ICD-10-CM

## 2011-06-15 DIAGNOSIS — I480 Paroxysmal atrial fibrillation: Secondary | ICD-10-CM

## 2011-06-15 LAB — POCT INR: INR: 2.3

## 2011-06-15 MED ORDER — DIGOXIN 250 MCG PO TABS: 250.0000 ug | ORAL_TABLET | Freq: Every day | ORAL | Status: DC

## 2011-06-15 MED ORDER — METOPROLOL TARTRATE 25 MG PO TABS: 25.0000 mg | ORAL_TABLET | Freq: Two times a day (BID) | ORAL | Status: DC

## 2011-06-15 NOTE — Assessment & Plan Note (Addendum)
Unfortunate, atrial arrhythmias have recurred.  While atrial flutter has replaced atrial fibrillation since dofetilide was started, current flutter is atypical and almost certainly not a recurrence of the arrhythmia that was ablated in 2002.  A repeat ablation is a consideration as well as change to a different antiarrhythmic agent.  It may be that he will tolerate this rhythm better than atrial fibrillation once adequate control of heart rate has been achieved.  For now, we will institute a rate control strategy by restarting digoxin and increasing his dose of metoprolol.  He will return in one week for reassessment of his heart rate  blood pressure and symptoms by the cardiology nurses.  A return visit to Dr. Ladona Ridgel will also be scheduled.

## 2011-06-15 NOTE — Assessment & Plan Note (Signed)
Blood pressure control has been good in the past 2 weeks.  Current medications will be continued.

## 2011-06-15 NOTE — Assessment & Plan Note (Signed)
INR is therapeutic today.  Current dose of warfarin will be continued.

## 2011-06-15 NOTE — Patient Instructions (Signed)
Your physician recommends that you schedule a follow-up appointment in:  1 - Dr Ladona Ridgel ASAP 2 - Dr [name] 1st available 3 - Nurse visit in 5-6 days for blood pressure check and rhythm strip  Your physician has recommended you make the following change in your medication:  1 - STOP Aspirin 2 - START Digoxin 0.25 mg twice a day tomorrow 3/1 then 0.25 mg daily thereafter 3 - INCREASE Metoprolol to 25 mg twice a day

## 2011-06-15 NOTE — Progress Notes (Signed)
Patient ID: [name] Cook, male   DOB: Sep 04, 1950, 61 y.o.   MRN: [id number] HPI: Scheduled return visit for this very nice gentleman with the recent onset of atrial fibrillation who failed DC cardioversion but responded very well to Tikosyn loading in hospital.  Since discharge, he has not noted cardiac symptoms, but has had an upper respiratory infection predominantly with rhinorrhea and nasal congestion, which has not allowed him to determine whether he has improved following chemical cardioversion.  Prior to Admission medications   Medication Sig Start Date End Date Taking? Authorizing Provider  aspirin EC 81 MG tablet Take 81 mg by mouth every morning.     Yes Historical Provider, MD  buPROPion (WELLBUTRIN) 100 MG tablet Take 100 mg by mouth 2 (two) times daily.   Yes Historical Provider, MD  celecoxib (CELEBREX) 200 MG capsule Take 200 mg by mouth daily.     Yes Historical Provider, MD  diltiazem (TIAZAC) 360 MG 24 hr capsule Take 1 capsule (360 mg total) by mouth daily. 05/05/11 05/04/12 Yes [name]. Katalyna Socarras, MD  dofetilide (TIKOSYN) 500 MCG capsule Take 1 capsule (500 mcg total) by mouth every 12 (twelve) hours. 06/09/11 06/23/11 Yes Ok Anis, NP  fish oil-omega-3 fatty acids 1000 MG capsule Take 1 g by mouth daily.     Yes Historical Provider, MD  Fluticasone-Salmeterol (ADVAIR) 250-50 MCG/DOSE AEPB Inhale 2 puffs into the lungs 2 (two) times daily. With spacer 04/20/11  Yes [name]. Jarek Longton, MD  furosemide (LASIX) 20 MG tablet Take 1 tablet (20 mg total) by mouth daily. 05/05/11 05/04/12 Yes [name]. Angee Gupton, MD  lisinopril (PRINIVIL,ZESTRIL) 10 MG tablet Take 1 tablet (10 mg total) by mouth daily. 04/20/11 04/19/12 Yes [name]. Shadow Schedler, MD  magnesium oxide (MAG-OX) 400 MG tablet Take 1 tablet (400 mg total) by mouth daily. 11/09/10  Yes Joni Reining, NP  metoprolol (LOPRESSOR) 25 MG tablet Take 0.5 tablets (12.5 mg total) by mouth 2 (two) times daily. 06/04/11 06/03/12 Yes Ward Givens,  MD  nitroGLYCERIN (NITROSTAT) 0.4 MG SL tablet Place 1 tablet (0.4 mg total) under the tongue every 5 (five) minutes x 3 doses as needed for chest pain. 06/09/11 06/08/12 Yes Ok Anis, NP  pantoprazole (PROTONIX) 40 MG tablet Take 40 mg by mouth daily.     Yes Historical Provider, MD  potassium chloride SA (K-DUR,KLOR-CON) 20 MEQ tablet Take 1 tablet (20 mEq total) by mouth daily. 06/09/11 06/08/12 Yes Ok Anis, NP  rosuvastatin (CRESTOR) 20 MG tablet Take 20 mg by mouth at bedtime.    Yes Historical Provider, MD  sitaGLIPtan-metformin (JANUMET) 50-1000 MG per tablet Take 1 tablet by mouth 2 (two) times daily with a meal.   Yes Historical Provider, MD  warfarin (COUMADIN) 7.5 MG tablet Take 3.75 mg by mouth daily.   Yes Historical Provider, MD   No Known Allergies    Past medical history, social history, and family history reviewed and updated.  ROS: Denies palpitations, lightheadedness, syncope, chest pain or dyspnea.  PHYSICAL EXAM: BP 109/66  Pulse 85  Ht 6' (1.829 m)  Wt 103.42 kg (228 lb)  BMI 30.92 kg/m2  General-Well developed; no acute distress Body habitus-proportionate weight and height Neck-No JVD; no carotid bruits Lungs-clear lung fields; resonant to percussion Cardiovascular-normal PMI; normal S1 and S2; somewhat rapid and irregular rhythm Abdomen-normal bowel sounds; soft and non-tender without masses or organomegaly Musculoskeletal-No deformities, no cyanosis or clubbing Neurologic-Normal cranial nerves; symmetric strength and tone Skin-Warm, no significant  lesions Extremities-distal pulses intact; no edema  Rhythm Strip: Atrial fibrillation with a rapid ventricular response; ventricular rate is 132 bpm. EKG:  Atrial flutter with variable AV block and a rapid ventricular response; nondiagnostic inferior Q waves; nonspecific ST-T wave abnormalities, most prominent inferiorly.  ASSESSMENT AND PLAN:  [place] Bing, MD 06/15/2011 3:45 PM

## 2011-06-16 ENCOUNTER — Telehealth: Payer: Self-pay | Admitting: *Deleted

## 2011-06-16 MED ORDER — DIGOXIN 250 MCG PO TABS: 250.0000 ug | ORAL_TABLET | Freq: Every day | ORAL | Status: DC

## 2011-06-16 NOTE — Telephone Encounter (Signed)
Patient walked in to office with concerns regarding his medication.  States that his Medicaid was cancelled as of yesterday for unknown reasons.  Pharmacy changed to [facility] in order to facilitate decreasing generic medication cost.  Referral made to Alecia Lemming, Prescription Assistance Director at [facility] for additional medication coverage.  Contacted Lubertha Basque in financial services for [facility], as patient will likely be undergoing additional procedures within the system and will need assistance with that piece, as well.

## 2011-06-19 ENCOUNTER — Other Ambulatory Visit: Payer: Self-pay | Admitting: *Deleted

## 2011-06-19 MED ORDER — SITAGLIPTIN PHOS-METFORMIN HCL 50-1000 MG PO TABS: 1.0000 | ORAL_TABLET | Freq: Two times a day (BID) | ORAL | Status: DC

## 2011-06-19 MED ORDER — DOFETILIDE 500 MCG PO CAPS: 500.0000 ug | ORAL_CAPSULE | Freq: Two times a day (BID) | ORAL | Status: DC

## 2011-06-19 MED ORDER — ROSUVASTATIN CALCIUM 20 MG PO TABS: 20.0000 mg | ORAL_TABLET | Freq: Every day | ORAL | Status: DC

## 2011-06-19 MED ORDER — FLUTICASONE-SALMETEROL 250-50 MCG/DOSE IN AEPB: 2.0000 | INHALATION_SPRAY | Freq: Two times a day (BID) | RESPIRATORY_TRACT | Status: DC

## 2011-06-20 ENCOUNTER — Encounter: Payer: Self-pay | Admitting: Internal Medicine

## 2011-06-20 ENCOUNTER — Ambulatory Visit (INDEPENDENT_AMBULATORY_CARE_PROVIDER_SITE_OTHER): Payer: Medicaid Other | Admitting: Internal Medicine

## 2011-06-20 ENCOUNTER — Encounter: Payer: Medicaid Other | Admitting: Internal Medicine

## 2011-06-20 VITALS — BP 139/88 | HR 64 | Resp 18 | Ht 72.0 in | Wt 230.0 lb

## 2011-06-20 DIAGNOSIS — I10 Essential (primary) hypertension: Secondary | ICD-10-CM

## 2011-06-20 DIAGNOSIS — I4891 Unspecified atrial fibrillation: Secondary | ICD-10-CM

## 2011-06-20 DIAGNOSIS — I4892 Unspecified atrial flutter: Secondary | ICD-10-CM

## 2011-06-20 DIAGNOSIS — I480 Paroxysmal atrial fibrillation: Secondary | ICD-10-CM

## 2011-06-20 MED ORDER — FLUTICASONE-SALMETEROL 250-50 MCG/DOSE IN AEPB: 2.0000 | INHALATION_SPRAY | Freq: Two times a day (BID) | RESPIRATORY_TRACT | Status: DC

## 2011-06-20 MED ORDER — CELECOXIB 200 MG PO CAPS: 200.0000 mg | ORAL_CAPSULE | Freq: Every day | ORAL | Status: DC

## 2011-06-20 MED ORDER — METOPROLOL SUCCINATE ER 50 MG PO TB24: 50.0000 mg | ORAL_TABLET | Freq: Every day | ORAL | Status: DC

## 2011-06-20 MED ORDER — PANTOPRAZOLE SODIUM 40 MG PO TBEC: 40.0000 mg | DELAYED_RELEASE_TABLET | Freq: Every day | ORAL | Status: DC

## 2011-06-20 NOTE — Patient Instructions (Signed)
Your physician recommends that you schedule a follow-up appointment in: Follow up with Dr [name] as scheduled and Dr Ladona Ridgel will consult with him regarding any further plans.

## 2011-06-21 ENCOUNTER — Encounter: Payer: Self-pay | Admitting: Internal Medicine

## 2011-06-21 NOTE — Assessment & Plan Note (Signed)
His blood pressure is minimally elevated. He will continue his current meds. I have asked him to reduce his salt intake.

## 2011-06-21 NOTE — Progress Notes (Signed)
HPI [name] Cook returns today for followup. He has a h/o persistent atrial fib, s/p unsuccessful DCCV. He was subsequently admitted for initiation of tikosyn therapy and converted to NSR on Tikosyn. He was in the office last week. He had an URI and was found to be back out of rhythm (coarse atrial fib vs left atrial flutter). He has continued his Tikosyn and is now back in NSR. He does not have much in the way of symptoms in atrial fib. At least he does not experience palpitations. No syncope. No Known Allergies   Current Outpatient Prescriptions  Medication Sig Dispense Refill  . buPROPion (WELLBUTRIN) 100 MG tablet Take 100 mg by mouth 2 (two) times daily.      . celecoxib (CELEBREX) 200 MG capsule Take 1 capsule (200 mg total) by mouth daily.  30 capsule  0  . digoxin (LANOXIN) 0.25 MG tablet Take 1 tablet (250 mcg total) by mouth daily.  30 tablet  12  . diltiazem (TIAZAC) 360 MG 24 hr capsule Take 1 capsule (360 mg total) by mouth daily.  90 capsule  3  . dofetilide (TIKOSYN) 500 MCG capsule Take 1 capsule (500 mcg total) by mouth every 12 (twelve) hours.  60 capsule  6  . fish oil-omega-3 fatty acids 1000 MG capsule Take 1 g by mouth daily.        . Fluticasone-Salmeterol (ADVAIR) 250-50 MCG/DOSE AEPB Inhale 2 puffs into the lungs 2 (two) times daily. With spacer  60 each  0  . furosemide (LASIX) 20 MG tablet Take 1 tablet (20 mg total) by mouth daily.  90 tablet  3  . lisinopril (PRINIVIL,ZESTRIL) 10 MG tablet Take 1 tablet (10 mg total) by mouth daily.  30 tablet  3  . magnesium oxide (MAG-OX) 400 MG tablet Take 1 tablet (400 mg total) by mouth daily.  30 tablet  6  . metoprolol succinate (TOPROL-XL) 50 MG 24 hr tablet Take 1 tablet (50 mg total) by mouth daily. Take with or immediately following a meal.  30 tablet  12  . nitroGLYCERIN (NITROSTAT) 0.4 MG SL tablet Place 1 tablet (0.4 mg total) under the tongue every 5 (five) minutes x 3 doses as needed for chest pain.  25 tablet  3  .  pantoprazole (PROTONIX) 40 MG tablet Take 1 tablet (40 mg total) by mouth daily.  30 tablet  0  . potassium chloride SA (K-DUR,KLOR-CON) 20 MEQ tablet Take 1 tablet (20 mEq total) by mouth daily.  30 tablet  6  . rosuvastatin (CRESTOR) 20 MG tablet Take 1 tablet (20 mg total) by mouth at bedtime.  30 tablet  12  . sitaGLIPtan-metformin (JANUMET) 50-1000 MG per tablet Take 1 tablet by mouth 2 (two) times daily with a meal.  60 tablet  0  . warfarin (COUMADIN) 7.5 MG tablet Take 3.75 mg by mouth daily.         Past Medical History  Diagnosis Date  . Paroxysmal atrial fibrillation     onset in 2009; cardiac cath in 2001-50% circumflex; recurred 04/2010, 03/2011; ventricular rate controlled; normal coronary angiography in 2001; tachycardia mediated cardiomyopathy;  Tikosyn initiated 05/2011  . Hypertension     with hypertensive heart disease  . Hyperlipidemia     04/2008:220, 289, 43, 119  . Atrial flutter     radiofrequency ablation in 2002; no subsequent recurrence  . Obesity   . Chronic anticoagulation   . Tobacco abuse   . Chronic obstructive pulmonary disease 04/20/2011  .  Shortness of breath   . GERD (gastroesophageal reflux disease)   . Arthritis   . COPD (chronic obstructive pulmonary disease)   . Diabetes mellitus, type 2     No insulin    ROS:   All systems reviewed and negative except as noted in the HPI.   Past Surgical History  Procedure Date  . Atrial ablation surgery 2002    atrial flutter; subsequently developed atrial fibrillation  . Implant-ilr     Subsequently removed  . Cardioversion 05/31/2011    Procedure: CARDIOVERSION;  Surgeon: [name]. [name], MD;  Location: [facility];  Service: Cardiovascular;  Laterality: N/A;     Family History  Problem Relation Age of Onset  . Alzheimer's disease Mother   . Osteoporosis Mother      History   Social History  . Marital Status: Single    Spouse Name: N/A    Number of Children: 0  . Years of Education: N/A    Occupational History  . Unemployed    Social History Main Topics  . Smoking status: Former Smoker -- 0.5 packs/day for 42 years    Types: Cigarettes    Quit date: 04/18/2011  . Smokeless tobacco: Never Used  . Alcohol Use: No  . Drug Use: No  . Sexually Active: Yes   Other Topics Concern  . Not on file   Social History Narrative   Unable to afford medicationsResides with girlfriend     BP 139/88  Pulse 64  Resp 18  Ht 6' (1.829 m)  Wt 104.327 kg (230 lb)  BMI 31.19 kg/m2  Physical Exam:  Well appearing middle aged man, NAD HEENT: Unremarkable Neck:  No JVD, no thyromegally Lymphatics:  No adenopathy Back:  No CVA tenderness Lungs:  Clear with no wheezes. HEART:  Regular rate rhythm, no murmurs, no rubs, no clicks Abd:  soft, positive bowel sounds, no organomegally, no rebound, no guarding Ext:  2 plus pulses, no edema, no cyanosis, no clubbing Skin:  No rashes no nodules Neuro:  CN II through XII intact, motor grossly intact  EKG NSR  Assess/Plan:

## 2011-06-21 NOTE — Assessment & Plan Note (Signed)
He has gone back into atrial fib. I have discussed the treatment options with the patient. I have recommended he continue his Tikosyn. It is not unusual for patients on Tikosyn to transiently go back out of rhythm, especially in the setting of an URI.

## 2011-06-26 ENCOUNTER — Ambulatory Visit (INDEPENDENT_AMBULATORY_CARE_PROVIDER_SITE_OTHER): Payer: Self-pay | Admitting: *Deleted

## 2011-06-26 DIAGNOSIS — I480 Paroxysmal atrial fibrillation: Secondary | ICD-10-CM

## 2011-06-26 DIAGNOSIS — I4891 Unspecified atrial fibrillation: Secondary | ICD-10-CM

## 2011-06-26 DIAGNOSIS — I4892 Unspecified atrial flutter: Secondary | ICD-10-CM

## 2011-06-26 DIAGNOSIS — Z7901 Long term (current) use of anticoagulants: Secondary | ICD-10-CM

## 2011-06-26 LAB — POCT INR: INR: 2.5

## 2011-06-27 ENCOUNTER — Telehealth: Payer: Self-pay | Admitting: Physician Assistant

## 2011-06-27 NOTE — Telephone Encounter (Signed)
Mr. Steck called in to report feeling very poorly the last 1-2 days. Every time he stands up he feels as though he is going to fall back down and pass out. His exercise tolerance is markedly decreased. He feels his HR is out of rhythm. He is on Tikosyn. Because of feeling poorly and inability to evaluate his rhythm via phone i instructed him to proceed to ER given persistence of symptoms and not to drive himself. He verbalized understanding and gratitude.   Rogue Rafalski PA-C

## 2011-07-04 ENCOUNTER — Ambulatory Visit (INDEPENDENT_AMBULATORY_CARE_PROVIDER_SITE_OTHER): Payer: Self-pay | Admitting: Cardiology

## 2011-07-04 ENCOUNTER — Encounter: Payer: Self-pay | Admitting: Cardiology

## 2011-07-04 VITALS — BP 97/57 | HR 71 | Ht 72.0 in | Wt 230.0 lb

## 2011-07-04 DIAGNOSIS — I4891 Unspecified atrial fibrillation: Secondary | ICD-10-CM

## 2011-07-04 DIAGNOSIS — I10 Essential (primary) hypertension: Secondary | ICD-10-CM

## 2011-07-04 DIAGNOSIS — Z7901 Long term (current) use of anticoagulants: Secondary | ICD-10-CM

## 2011-07-04 DIAGNOSIS — I4892 Unspecified atrial flutter: Secondary | ICD-10-CM

## 2011-07-04 DIAGNOSIS — I480 Paroxysmal atrial fibrillation: Secondary | ICD-10-CM

## 2011-07-04 MED ORDER — METOPROLOL SUCCINATE ER 100 MG PO TB24: 100.0000 mg | ORAL_TABLET | Freq: Every day | ORAL | Status: DC

## 2011-07-04 MED ORDER — METOPROLOL SUCCINATE ER 50 MG PO TB24: ORAL_TABLET | ORAL | Status: DC

## 2011-07-04 MED ORDER — FLECAINIDE ACETATE 100 MG PO TABS: 100.0000 mg | ORAL_TABLET | Freq: Two times a day (BID) | ORAL | Status: DC

## 2011-07-04 NOTE — Assessment & Plan Note (Addendum)
Recurrent atrial fibrillation with severe symptoms.  I suspect that AF ablation will ultimately be required.  Patient inquired about AV nodal ablation with pacing.  I explained that this is rarely necessary with current methods of treatment.  Case discussed with Dr. Ladona Ridgel.  Dofetilide will be discontinued and Amiodarone started at the dose of 200 mg twice a day.  Patient has not had significant coronary disease although an isolated 50% lesion was seen at catheterization in 2001. He will be referred to Dr. Johney Frame for consideration of atrial fibrillation ablation.  Exercise heart rate was clearly not a significant problem, and amiodarone will provide even further slowing of ventricular response to atrial fibrillation.

## 2011-07-04 NOTE — Patient Instructions (Addendum)
Your physician recommends that you schedule a follow-up appointment in: 10 days  Your physician has recommended you make the following change in your medication:  1 - START Flecainide 100 mg twice a day 2 - STOP Tikosyn 3 - STOP Aspirin 4 - STOP Lisinopril 5 - INCREASE Toprol to 75 mg daily (1 1/2 tabs)

## 2011-07-04 NOTE — Assessment & Plan Note (Signed)
Blood pressure has been on the low side with current medication.  ACE Inhibitor will be discontinued.

## 2011-07-04 NOTE — Assessment & Plan Note (Signed)
INR has been stable and therapeutic; no adverse effects of anticoagulation apparent.

## 2011-07-04 NOTE — Progress Notes (Signed)
Patient ID: [name] Cook, male   DOB: 1950-07-16, 61 y.o.   MRN: [id number] HPI: Scheduled return visit for this nice gentleman with recent onset of atrial fibrillation and recurrent arrhythmia while taking dofetilide.  When last seen by Dr. Ladona Ridgel a few weeks ago, he had experienced a severe symptomatic spell within the prior few days, but sinus rhythm was present at the time of the office visit.  Since then, he has generally felt poorly with intermittent tachypalpitations, malaise, easy fatigability and poor exercise tolerance.  He notes no dyspnea nor chest discomfort.  Prior to Admission medications   Medication Sig Start Date End Date Taking? Authorizing Provider  buPROPion (WELLBUTRIN) 100 MG tablet Take 100 mg by mouth 2 (two) times daily.   Yes Historical Provider, MD  celecoxib (CELEBREX) 200 MG capsule Take 1 capsule (200 mg total) by mouth daily. 06/20/11  Yes Kathlen Brunswick, MD  digoxin (LANOXIN) 0.25 MG tablet Take 1 tablet (250 mcg total) by mouth daily. 06/16/11 06/15/12 Yes Kathlen Brunswick, MD  diltiazem [facility]) 360 MG 24 hr capsule Take 1 capsule (360 mg total) by mouth daily. 05/05/11 05/04/12 Yes Kathlen Brunswick, MD  fish oil-omega-3 fatty acids 1000 MG capsule Take 1 g by mouth daily.     Yes Historical Provider, MD  Fluticasone-Salmeterol (ADVAIR) 250-50 MCG/DOSE AEPB Inhale 2 puffs into the lungs 2 (two) times daily. With spacer 06/20/11  Yes Kathlen Brunswick, MD  furosemide (LASIX) 20 MG tablet Take 1 tablet (20 mg total) by mouth daily. 05/05/11 05/04/12 Yes Kathlen Brunswick, MD  lisinopril (PRINIVIL,ZESTRIL) 10 MG tablet Take 1 tablet (10 mg total) by mouth daily. 04/20/11 04/19/12 Yes Kathlen Brunswick, MD  magnesium oxide (MAG-OX) 400 MG tablet Take 1 tablet (400 mg total) by mouth daily. 11/09/10  Yes Jodelle Gross, NP  metoprolol succinate (TOPROL-XL) 50 MG 24 hr tablet Take 1 tablet (50 mg total) by mouth daily. Take with or immediately following a meal. 06/20/11  Yes Kathlen Brunswick, MD  nitroGLYCERIN (NITROSTAT) 0.4 MG SL tablet Place 1 tablet (0.4 mg total) under the tongue every 5 (five) minutes x 3 doses as needed for chest pain. 06/09/11 06/08/12 Yes Ok Anis, NP  pantoprazole (PROTONIX) 40 MG tablet Take 1 tablet (40 mg total) by mouth daily. 06/20/11  Yes Kathlen Brunswick, MD  potassium chloride SA (K-DUR,KLOR-CON) 20 MEQ tablet Take 1 tablet (20 mEq total) by mouth daily. 06/09/11 06/08/12 Yes Ok Anis, NP  rosuvastatin (CRESTOR) 20 MG tablet Take 1 tablet (20 mg total) by mouth at bedtime. 06/19/11  Yes Jodelle Gross, NP  sitaGLIPtan-metformin (JANUMET) 50-1000 MG per tablet Take 1 tablet by mouth 2 (two) times daily with a meal. 06/19/11  Yes Jodelle Gross, NP  warfarin (COUMADIN) 7.5 MG tablet Take 3.75 mg by mouth daily.   Yes Historical Provider, MD  dofetilide (TIKOSYN) 500 MCG capsule Take 1 capsule (500 mcg total) by mouth every 12 (twelve) hours. 06/19/11 07/03/11  Jodelle Gross, NP    No Known Allergies    Past medical history, social history, and family history reviewed and updated.  ROS: Denies orthopnea, PND or syncope.  He has had intermittent lightheadedness.  PHYSICAL EXAM: BP 101/68  Pulse 71  Ht 6' (1.829 m)  Wt 104.327 kg (230 lb)  BMI 31.19 kg/m2  SpO2 95%; no significant orthostatic change in blood pressure  General-Well developed; no acute distress Body habitus-proportionate weight and height Neck-No  JVD; no carotid bruits Lungs-clear lung fields; resonant to percussion Cardiovascular-normal PMI; normal S1 and S2 Abdomen-normal bowel sounds; soft and non-tender without masses or organomegaly Musculoskeletal-No deformities, no cyanosis or clubbing Neurologic-Normal cranial nerves; symmetric strength and tone Skin-Warm, no significant lesions Extremities-distal pulses intact; no edema  Rhythm strip: Atrial fibrillation with a controlled ventricular response; heart rate is 90 bpm 6 minute walk:  Completed only 200 feet of ambulation and complained of severe fatigue.  Heart rate increased to 105 BPM  ASSESSMENT AND PLAN:   Bing, MD 07/04/2011 2:34 PM

## 2011-07-05 ENCOUNTER — Other Ambulatory Visit: Payer: Self-pay | Admitting: *Deleted

## 2011-07-05 MED ORDER — METOPROLOL SUCCINATE ER 50 MG PO TB24: ORAL_TABLET | ORAL | Status: DC

## 2011-07-05 MED ORDER — FLECAINIDE ACETATE 100 MG PO TABS: 100.0000 mg | ORAL_TABLET | Freq: Two times a day (BID) | ORAL | Status: DC

## 2011-07-07 ENCOUNTER — Other Ambulatory Visit: Payer: Self-pay | Admitting: *Deleted

## 2011-07-07 DIAGNOSIS — IMO0002 Reserved for concepts with insufficient information to code with codable children: Secondary | ICD-10-CM

## 2011-07-07 MED ORDER — AMIODARONE HCL 200 MG PO TABS: ORAL_TABLET | ORAL | Status: DC

## 2011-07-07 MED ORDER — METOPROLOL SUCCINATE ER 50 MG PO TB24: ORAL_TABLET | ORAL | Status: DC

## 2011-07-07 NOTE — Telephone Encounter (Signed)
Patient notified that a referral has been made to Dr Johney Frame to discuss consideration of ablation procedure.  Advised him, per Dr [name] that Amiodarone 200 mg bid x 1 month and then daily thereafter would be utilized rather than flecainide, which has not been started due to cost.  Patient states that he will pick medication up today.

## 2011-07-10 ENCOUNTER — Telehealth: Payer: Self-pay | Admitting: *Deleted

## 2011-07-10 NOTE — Telephone Encounter (Signed)
Current medication list for [name] Cook Wellbutrin 100 mg bid Celebrex 200 mg daily Digoxin 0.25 mg daily Diltiazem 360 mg daily Fish oil 1000 mg daily Advair 250/50 mcg 2 puffs twice daily Lasix 20 mg daily Mag OX 400 mg daily Toprol XL 50 mg (1 1/2 tablets) daily Nitrostat 0.4 mg as needed for chest pain Protonix 40 mg daily Potassium Chloride 20 meq daily Crestor 20 mg daily Janumet 50-1000 mg three times daily Coumadin 7.5 mg tablet as directed Amiodarone 200 mg daily

## 2011-07-17 ENCOUNTER — Encounter: Payer: Self-pay | Admitting: Cardiology

## 2011-07-17 ENCOUNTER — Ambulatory Visit (INDEPENDENT_AMBULATORY_CARE_PROVIDER_SITE_OTHER): Payer: Self-pay | Admitting: Cardiology

## 2011-07-17 ENCOUNTER — Ambulatory Visit (INDEPENDENT_AMBULATORY_CARE_PROVIDER_SITE_OTHER): Payer: Self-pay | Admitting: *Deleted

## 2011-07-17 ENCOUNTER — Encounter: Payer: Self-pay | Admitting: *Deleted

## 2011-07-17 VITALS — BP 117/71 | HR 65 | Resp 18 | Ht 74.0 in | Wt 231.0 lb

## 2011-07-17 DIAGNOSIS — I480 Paroxysmal atrial fibrillation: Secondary | ICD-10-CM

## 2011-07-17 DIAGNOSIS — I4891 Unspecified atrial fibrillation: Secondary | ICD-10-CM

## 2011-07-17 DIAGNOSIS — I4892 Unspecified atrial flutter: Secondary | ICD-10-CM

## 2011-07-17 DIAGNOSIS — Z7901 Long term (current) use of anticoagulants: Secondary | ICD-10-CM

## 2011-07-17 LAB — POCT INR: INR: 2.5

## 2011-07-17 MED ORDER — AMIODARONE HCL 200 MG PO TABS: ORAL_TABLET | ORAL | Status: DC

## 2011-07-17 NOTE — Progress Notes (Signed)
Patient ID: [name] Cook, male   DOB: [birth year], 61 y.o.   MRN: [id number]  HPI: Scheduled return visit for continuing assessment and treatment of paroxysmal atrial fibrillation.  Patient reports more energy and a generally improved sense of well-being in recent days.  He has been taking amiodarone 200 mg twice a day without any apparent adverse effects.  He denies palpitations, lightheadedness or syncope.  Prior to Admission medications   Medication Sig Start Date End Date Taking? Authorizing Provider  amiodarone (PACERONE) 200 MG tablet Take 200 mg twice a day for 1 month then once a day thereafter 07/07/11  Yes Kathlen Brunswick, MD  buPROPion [facility]) 100 MG tablet Take 100 mg by mouth 2 (two) times daily.   Yes Historical Provider, MD  celecoxib (CELEBREX) 200 MG capsule Take 1 capsule (200 mg total) by mouth daily. 06/20/11  Yes Kathlen Brunswick, MD  digoxin (LANOXIN) 0.25 MG tablet Take 1 tablet (250 mcg total) by mouth daily. 06/16/11 06/15/12 Yes Kathlen Brunswick, MD  diltiazem [facility]) 360 MG 24 hr capsule Take 1 capsule (360 mg total) by mouth daily. 05/05/11 05/04/12 Yes Kathlen Brunswick, MD  fish oil-omega-3 fatty acids 1000 MG capsule Take 1 g by mouth daily.     Yes Historical Provider, MD  Fluticasone-Salmeterol (ADVAIR) 250-50 MCG/DOSE AEPB Inhale 2 puffs into the lungs 2 (two) times daily. With spacer 06/20/11  Yes Kathlen Brunswick, MD  furosemide (LASIX) 20 MG tablet Take 1 tablet (20 mg total) by mouth daily. 05/05/11 05/04/12 Yes Kathlen Brunswick, MD  magnesium oxide (MAG-OX) 400 MG tablet Take 1 tablet (400 mg total) by mouth daily. 11/09/10  Yes Jodelle Gross, NP  metoprolol succinate (TOPROL-XL) 50 MG 24 hr tablet Take 75 mg (1 2/2 tablets) 07/07/11  Yes Kathlen Brunswick, MD  nitroGLYCERIN (NITROSTAT) 0.4 MG SL tablet Place 1 tablet (0.4 mg total) under the tongue every 5 (five) minutes x 3 doses as needed for chest pain. 06/09/11 06/08/12 Yes Ok Anis, NP    pantoprazole (PROTONIX) 40 MG tablet Take 1 tablet (40 mg total) by mouth daily. 06/20/11  Yes Kathlen Brunswick, MD  potassium chloride SA (K-DUR,KLOR-CON) 20 MEQ tablet Take 1 tablet (20 mEq total) by mouth daily. 06/09/11 06/08/12 Yes Ok Anis, NP  rosuvastatin (CRESTOR) 20 MG tablet Take 1 tablet (20 mg total) by mouth at bedtime. 06/19/11  Yes Jodelle Gross, NP  sitaGLIPtan-metformin (JANUMET) 50-1000 MG per tablet Take 1 tablet by mouth 2 (two) times daily with a meal. 06/19/11  Yes Jodelle Gross, NP  warfarin (COUMADIN) 7.5 MG tablet Take 3.75 mg by mouth daily.   Yes Historical Provider, MD  dofetilide (TIKOSYN) 500 MCG capsule Take 1 capsule (500 mcg total) by mouth every 12 (twelve) hours. 06/19/11 07/03/11  Jodelle Gross, NP   No Known Allergies    Past medical history, social history, and family history reviewed and updated.  ROS: Denies chest discomfort, orthopnea, PND, cough, sputum production or abdominal pain.  PHYSICAL EXAM: BP 117/71  Pulse 65  Resp 18  Ht 6\' 2"  (1.88 m)  Wt 104.781 kg (231 lb)  BMI 29.66 kg/m2; weight is stable General-Well developed; no acute distress Body habitus-mildly overweight Neck-No JVD; no carotid bruits Lungs-clear lung fields; resonant to percussion Cardiovascular-normal PMI; normal S1 and S2 Abdomen-normal bowel sounds; soft and non-tender without masses or organomegaly Musculoskeletal-No deformities, no cyanosis or clubbing Neurologic-Normal cranial nerves; symmetric strength and tone Skin-Warm,  no significant lesions Extremities-distal pulses intact; no edema  Rhythm Strip: Normal sinus rhythm at a rate of 63 bpm  ASSESSMENT AND PLAN:  [place] Bing, MD 07/17/2011 2:42 PM

## 2011-07-17 NOTE — Patient Instructions (Addendum)
Your physician recommends that you schedule a follow-up appointment in:  1 - 6 weeks with Dr [name] 2 - As soon as possible with Dr Johney Frame  Your physician has recommended you make the following change in your medication:  1 - Continue Amiodarone twice a day for 2 weeks then once a day thereafter 2 - STOP Digoxin 3 - CHANGE Lasix to as needed for swelling 4 - STOP Magnesium 5 - STOP Potassium  Your physician recommends that you return for lab work in:  1 month

## 2011-07-18 NOTE — Assessment & Plan Note (Signed)
Patient has converted back to sinus rhythm, perhaps as a result of amiodarone therapy, although the total dose received so far is fairly low.  He will continue twice a day dosing for the next 2 weeks and then reduce amiodarone to 200 mg per day.  Appointment with Dr. Johney Frame is pending, but ablation likely will be deferred in the face was apparently successful pharmacologic therapy.  Appropriate monitoring laboratories will be obtained.

## 2011-07-18 NOTE — Assessment & Plan Note (Signed)
Management of warfarin dosing will be continued.  No evidence for occult GI blood loss.

## 2011-08-04 ENCOUNTER — Telehealth: Payer: Self-pay | Admitting: *Deleted

## 2011-08-04 NOTE — Telephone Encounter (Signed)
Current Outpatient Prescriptions on File Prior to Visit  Medication Sig Dispense Refill  . amiodarone (PACERONE) 200 MG tablet Take 200 mg twice a day for 2 weeks then once a day thereafter  45 tablet  3  . buPROPion (WELLBUTRIN) 100 MG tablet Take 100 mg by mouth 2 (two) times daily.      . celecoxib (CELEBREX) 200 MG capsule Take 1 capsule (200 mg total) by mouth daily.  30 capsule  0  . diltiazem (TIAZAC) 360 MG 24 hr capsule Take 1 capsule (360 mg total) by mouth daily.  90 capsule  3  . fish oil-omega-3 fatty acids 1000 MG capsule Take 1 g by mouth daily.        . Fluticasone-Salmeterol (ADVAIR) 250-50 MCG/DOSE AEPB Inhale 2 puffs into the lungs 2 (two) times daily. With spacer  60 each  0  . furosemide (LASIX) 20 MG tablet Take 20 mg by mouth as needed.      . metoprolol succinate (TOPROL-XL) 50 MG 24 hr tablet Take 75 mg (1 2/2 tablets)  45 tablet  12  . nitroGLYCERIN (NITROSTAT) 0.4 MG SL tablet Place 1 tablet (0.4 mg total) under the tongue every 5 (five) minutes x 3 doses as needed for chest pain.  25 tablet  3  . pantoprazole (PROTONIX) 40 MG tablet Take 1 tablet (40 mg total) by mouth daily.  30 tablet  0  . rosuvastatin (CRESTOR) 20 MG tablet Take 1 tablet (20 mg total) by mouth at bedtime.  30 tablet  12  . sitaGLIPtan-metformin (JANUMET) 50-1000 MG per tablet Take 1 tablet by mouth 2 (two) times daily with a meal.  60 tablet  0  . warfarin (COUMADIN) 7.5 MG tablet Take 3.75 mg by mouth daily.      Marland Kitchen DISCONTD: lisinopril (PRINIVIL,ZESTRIL) 10 MG tablet Take 1 tablet (10 mg total) by mouth daily.  30 tablet  3   List faxed to Blima Singer at [facility] Prescription assistance program

## 2011-08-07 ENCOUNTER — Other Ambulatory Visit: Payer: Self-pay | Admitting: *Deleted

## 2011-08-07 MED ORDER — SITAGLIPTIN PHOS-METFORMIN HCL 50-1000 MG PO TABS: 1.0000 | ORAL_TABLET | Freq: Two times a day (BID) | ORAL | Status: DC

## 2011-08-10 ENCOUNTER — Other Ambulatory Visit: Payer: Self-pay | Admitting: *Deleted

## 2011-08-10 DIAGNOSIS — I4891 Unspecified atrial fibrillation: Secondary | ICD-10-CM

## 2011-08-11 ENCOUNTER — Ambulatory Visit (INDEPENDENT_AMBULATORY_CARE_PROVIDER_SITE_OTHER): Payer: Self-pay | Admitting: Internal Medicine

## 2011-08-11 ENCOUNTER — Encounter: Payer: Self-pay | Admitting: Internal Medicine

## 2011-08-11 VITALS — BP 136/84 | HR 84 | Ht 72.0 in | Wt 233.0 lb

## 2011-08-11 DIAGNOSIS — I4819 Other persistent atrial fibrillation: Secondary | ICD-10-CM

## 2011-08-11 DIAGNOSIS — I4892 Unspecified atrial flutter: Secondary | ICD-10-CM

## 2011-08-11 DIAGNOSIS — I4891 Unspecified atrial fibrillation: Secondary | ICD-10-CM

## 2011-08-11 NOTE — Progress Notes (Signed)
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 male with a h/o persistent atrial fibrillation who presents for EP follow-up.  He reports initially being diagnosed with atrial fibrillation and atrial flutter in 2002.  He underwent atrial flutter ablation at that time by Dr Ladona Ridgel.  He did well for several years thereafter.  He reports increasing frequency and duration of atrial fibrillation since that time.  He failed medical therapy with tikosyn.  He was therefore placed on amiodarone.  He has done better with amiodarone and is presently maintaining sinus rhythm.  Today, he denies symptoms of chest pain, shortness of breath (above baseline) lower extremity edema, dizziness, presyncope, syncope, or neurologic sequela. The pateint is tolerating medications without difficulties and is otherwise without complaint today.   Past Medical History  Diagnosis Date  . Persistent atrial fibrillation     onset in 2009; cardiac cath in 2001-50% circumflex; recurred 04/2010, 03/2011; ventricular rate controlled; normal coronary angiography in 2001; tachycardia mediated cardiomyopathy;  Tikosyn initiated 05/2011  . Hypertension     with hypertensive heart disease  . Hyperlipidemia     04/2008:220, 289, 43, 119  . Atrial flutter     radiofrequency ablation in 2002  . Obesity   . Chronic anticoagulation   . Tobacco abuse   . Chronic obstructive pulmonary disease 04/20/2011  . GERD (gastroesophageal reflux disease)   . Arthritis   . Diabetes mellitus, type 2     No insulin   Past Surgical History  Procedure Date  . Atrial ablation surgery 2002    atrial flutter; subsequently developed atrial fibrillation  . Implant-ilr     Subsequently removed  . Cardioversion 05/31/2011    Procedure: CARDIOVERSION;  Surgeon: [name]. [name], MD;  Location: [facility];  Service: Cardiovascular;  Laterality: N/A;    Current  Outpatient Prescriptions  Medication Sig Dispense Refill  . amiodarone (PACERONE) 200 MG tablet Take 200 mg twice a day for 2 weeks then once a day thereafter  45 tablet  3  . buPROPion (WELLBUTRIN) 100 MG tablet Take 100 mg by mouth 2 (two) times daily.      . celecoxib (CELEBREX) 200 MG capsule Take 1 capsule (200 mg total) by mouth daily.  30 capsule  0  . diltiazem (TIAZAC) 360 MG 24 hr capsule Take 1 capsule (360 mg total) by mouth daily.  90 capsule  3  . fish oil-omega-3 fatty acids 1000 MG capsule Take 1 g by mouth daily.        . Fluticasone-Salmeterol (ADVAIR) 250-50 MCG/DOSE AEPB Inhale 2 puffs into the lungs 2 (two) times daily. With spacer  60 each  0  . furosemide (LASIX) 20 MG tablet Take 20 mg by mouth as needed.      . metoprolol succinate (TOPROL-XL) 50 MG 24 hr tablet Take 75 mg (1 2/2 tablets)  45 tablet  12  . nitroGLYCERIN (NITROSTAT) 0.4 MG SL tablet Place 1 tablet (0.4 mg total) under the tongue every 5 (five) minutes x 3 doses as needed for chest pain.  25 tablet  3  . pantoprazole (PROTONIX) 40 MG tablet Take 1 tablet (40 mg total) by mouth daily.  30 tablet  0  . rosuvastatin (CRESTOR) 20 MG tablet Take 1 tablet (20 mg total) by mouth at bedtime.  30 tablet  12  . sitaGLIPtan-metformin (JANUMET) 50-1000 MG per tablet Take 1  tablet by mouth 2 (two) times daily with a meal.  60 tablet  0  . warfarin (COUMADIN) 7.5 MG tablet Take 3.75 mg by mouth daily.      Marland Kitchen DISCONTD: lisinopril (PRINIVIL,ZESTRIL) 10 MG tablet Take 1 tablet (10 mg total) by mouth daily.  30 tablet  3    No Known Allergies  History   Social History  . Marital Status: Single    Spouse Name: N/A    Number of Children: 0  . Years of Education: N/A   Occupational History  . Unemployed    Social History Main Topics  . Smoking status: Current Some Day Smoker -- 0.5 packs/day for 42 years    Types: Cigarettes    Last Attempt to Quit: 04/18/2011  . Smokeless tobacco: Never Used   Comment: he is  trying to quit  . Alcohol Use: No  . Drug Use: No  . Sexually Active: Yes   Other Topics Concern  . 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    Family History  Problem Relation Age of Onset  . Alzheimer's disease Mother   . Osteoporosis Mother     ROS- All systems are reviewed and negative except as per the HPI above  Physical Exam: Filed Vitals:   08/11/11 1141  BP: 136/84  Pulse: 84  Height: 6' (1.829 m)  Weight: 233 lb (105.688 kg)    GEN- The patient is well appearing, alert and oriented x 3 today.   Head- normocephalic, atraumatic Eyes-  Sclera clear, conjunctiva pink Ears- hearing intact Oropharynx- clear Neck- supple, no JVP Lymph- no cervical lymphadenopathy Lungs- Clear to ausculation bilaterally, normal work of breathing Heart- Regular rate and rhythm, no murmurs, rubs or gallops, PMI not laterally displaced GI- soft, NT, ND, + BS Extremities- no clubbing, cyanosis, or edema   EKG-today reveal sinus rhythm 84 bpm  Assessment and Plan:

## 2011-08-11 NOTE — Patient Instructions (Signed)
Your physician recommends that you schedule a follow-up appointment in 3 months with Dr Allred    

## 2011-08-13 NOTE — Assessment & Plan Note (Signed)
The patient has persistent atrial fibrillation.  He has previously failed medical therapy with tikosyn.  He is presently maintaining sinus rhythm with amiodarone and is tolerated this medicine well.   Therapeutic strategies for afib including medicine and ablation were discussed in detail with the patient today. Risk, benefits, and alternatives to EP study and radiofrequency ablation for afib were also discussed in detail today. At this point, he would like to continue medical therapy. No changes were made today.  I will see him again in 3 months.  IF he is doing well at that time then I will return his care to Drs [name] and Ladona Ridgel.

## 2011-08-16 ENCOUNTER — Ambulatory Visit (INDEPENDENT_AMBULATORY_CARE_PROVIDER_SITE_OTHER): Payer: Self-pay | Admitting: *Deleted

## 2011-08-16 DIAGNOSIS — I4819 Other persistent atrial fibrillation: Secondary | ICD-10-CM

## 2011-08-16 DIAGNOSIS — I4892 Unspecified atrial flutter: Secondary | ICD-10-CM

## 2011-08-16 DIAGNOSIS — Z7901 Long term (current) use of anticoagulants: Secondary | ICD-10-CM

## 2011-08-16 DIAGNOSIS — I4891 Unspecified atrial fibrillation: Secondary | ICD-10-CM

## 2011-08-16 LAB — POCT INR: INR: 1.1

## 2011-08-22 ENCOUNTER — Other Ambulatory Visit: Payer: Self-pay | Admitting: *Deleted

## 2011-08-22 ENCOUNTER — Encounter: Payer: Self-pay | Admitting: *Deleted

## 2011-08-24 ENCOUNTER — Telehealth: Payer: Self-pay | Admitting: *Deleted

## 2011-08-24 ENCOUNTER — Ambulatory Visit (INDEPENDENT_AMBULATORY_CARE_PROVIDER_SITE_OTHER): Payer: Self-pay | Admitting: *Deleted

## 2011-08-24 DIAGNOSIS — I4891 Unspecified atrial fibrillation: Secondary | ICD-10-CM

## 2011-08-24 DIAGNOSIS — I4892 Unspecified atrial flutter: Secondary | ICD-10-CM

## 2011-08-24 DIAGNOSIS — I4819 Other persistent atrial fibrillation: Secondary | ICD-10-CM

## 2011-08-24 DIAGNOSIS — Z7901 Long term (current) use of anticoagulants: Secondary | ICD-10-CM

## 2011-08-24 LAB — POCT INR: INR: 1.9

## 2011-08-24 MED ORDER — WARFARIN SODIUM 2.5 MG PO TABS: 2.5000 mg | ORAL_TABLET | Freq: Every day | ORAL | Status: DC

## 2011-08-24 NOTE — Telephone Encounter (Signed)
Second reminder made via phone call regarding past due lab work.

## 2011-08-25 ENCOUNTER — Encounter: Payer: Self-pay | Admitting: Cardiology

## 2011-08-25 LAB — CBC
HCT: 42 % (ref 39.0–52.0)
Hemoglobin: 14 g/dL (ref 13.0–17.0)
MCH: 31.5 pg (ref 26.0–34.0)
MCHC: 33.3 g/dL (ref 30.0–36.0)
MCV: 94.6 fL (ref 78.0–100.0)
Platelets: 172 10*3/uL (ref 150–400)
RBC: 4.44 MIL/uL (ref 4.22–5.81)
RDW: 14 % (ref 11.5–15.5)
WBC: 7.8 10*3/uL (ref 4.0–10.5)

## 2011-08-25 LAB — TSH: TSH: 3.613 u[IU]/mL (ref 0.350–4.500)

## 2011-08-25 LAB — COMPREHENSIVE METABOLIC PANEL WITH GFR
ALT: 22 U/L (ref 0–53)
AST: 19 U/L (ref 0–37)
Albumin: 4.3 g/dL (ref 3.5–5.2)
Alkaline Phosphatase: 59 U/L (ref 39–117)
BUN: 15 mg/dL (ref 6–23)
CO2: 24 meq/L (ref 19–32)
Calcium: 9.1 mg/dL (ref 8.4–10.5)
Chloride: 104 meq/L (ref 96–112)
Creat: 0.9 mg/dL (ref 0.50–1.35)
Glucose, Bld: 122 mg/dL — ABNORMAL HIGH (ref 70–99)
Potassium: 4.5 meq/L (ref 3.5–5.3)
Sodium: 139 meq/L (ref 135–145)
Total Bilirubin: 0.3 mg/dL (ref 0.3–1.2)
Total Protein: 6.1 g/dL (ref 6.0–8.3)

## 2011-08-29 ENCOUNTER — Ambulatory Visit (INDEPENDENT_AMBULATORY_CARE_PROVIDER_SITE_OTHER): Payer: Self-pay | Admitting: Cardiology

## 2011-08-29 ENCOUNTER — Encounter: Payer: Self-pay | Admitting: *Deleted

## 2011-08-29 ENCOUNTER — Encounter: Payer: Self-pay | Admitting: Cardiology

## 2011-08-29 VITALS — BP 148/91 | HR 67 | Ht 74.0 in | Wt 234.0 lb

## 2011-08-29 DIAGNOSIS — R5381 Other malaise: Secondary | ICD-10-CM

## 2011-08-29 DIAGNOSIS — I10 Essential (primary) hypertension: Secondary | ICD-10-CM

## 2011-08-29 DIAGNOSIS — I4891 Unspecified atrial fibrillation: Secondary | ICD-10-CM

## 2011-08-29 DIAGNOSIS — Z7901 Long term (current) use of anticoagulants: Secondary | ICD-10-CM

## 2011-08-29 DIAGNOSIS — R5383 Other fatigue: Secondary | ICD-10-CM

## 2011-08-29 DIAGNOSIS — I4819 Other persistent atrial fibrillation: Secondary | ICD-10-CM

## 2011-08-29 NOTE — Patient Instructions (Signed)
Your physician recommends that you schedule a follow-up appointment in: 3 months  Your physician recommends that you return for lab work in:  2 months

## 2011-08-29 NOTE — Assessment & Plan Note (Signed)
Blood pressure is marginal today, but has been well controlled on multiple determinations over the past few months.  Current regimen will be continued and surveillance continued as well.

## 2011-08-29 NOTE — Progress Notes (Signed)
Patient ID: [name] Cook, male   DOB: 1950/08/12, 61 y.o.   MRN: [id number]  HPI: Scheduled return visit for this nice gentleman with long-standing atrial arrhythmias, most recently atrial fibrillation, which converted to atrial flutter under treatment with dofetilide.  Amiodarone was substituted followed by conversion to sinus rhythm.  Since then, patient has experienced no arrhythmias, no lightheadedness and no syncope.  He's had some minor GI distress, but generally has felt fine.  Patient asks whether any of his medications could promote an increased level of anger.  He has been treated with Wellbutrin for depression and recently was switched to citalopram 20 mg per day.  I suggested he discuss this with his PCP.  Prior to Admission medications   Medication Sig Start Date End Date Taking? Authorizing Provider  amiodarone (PACERONE) 200 MG tablet Take 200 mg twice a day for 2 weeks then once a day thereafter 07/17/11  Yes Kathlen Brunswick, MD  buPROPion [facility]) 100 MG tablet Take 100 mg by mouth 2 (two) times daily.   Yes Historical Provider, MD  celecoxib (CELEBREX) 200 MG capsule Take 1 capsule (200 mg total) by mouth daily. 06/20/11  Yes Kathlen Brunswick, MD  diltiazem [facility]) 360 MG 24 hr capsule Take 1 capsule (360 mg total) by mouth daily. 05/05/11 05/04/12 Yes Kathlen Brunswick, MD  fish oil-omega-3 fatty acids 1000 MG capsule Take 1 g by mouth daily.     Yes Historical Provider, MD  Fluticasone-Salmeterol (ADVAIR) 250-50 MCG/DOSE AEPB Inhale 2 puffs into the lungs 2 (two) times daily. With spacer 06/20/11  Yes Kathlen Brunswick, MD  furosemide (LASIX) 20 MG tablet Take 20 mg by mouth as needed. 05/05/11 05/04/12 Yes Kathlen Brunswick, MD  metoprolol succinate (TOPROL-XL) 50 MG 24 hr tablet Take 75 mg (1 2/2 tablets) 07/07/11  Yes Kathlen Brunswick, MD  nitroGLYCERIN (NITROSTAT) 0.4 MG SL tablet Place 1 tablet (0.4 mg total) under the tongue every 5 (five) minutes x 3 doses as needed for chest  pain. 06/09/11 06/08/12 Yes Ok Anis, NP  pantoprazole (PROTONIX) 40 MG tablet Take 1 tablet (40 mg total) by mouth daily. 06/20/11  Yes Kathlen Brunswick, MD  rosuvastatin (CRESTOR) 20 MG tablet Take 1 tablet (20 mg total) by mouth at bedtime. 06/19/11  Yes Jodelle Gross, NP  sitaGLIPtan-metformin (JANUMET) 50-1000 MG per tablet Take 1 tablet by mouth 2 (two) times daily with a meal. 08/07/11  Yes Kathlen Brunswick, MD  warfarin (COUMADIN) 2.5 MG tablet Take 1 tablet (2.5 mg total) by mouth daily. Take 1 tablet daily except 2 tablets on Mondays and Thursdays 08/24/11   Kathlen Brunswick, MD  No Known Allergies    Past medical history, social history, and family history reviewed and updated.  ROS: Denies orthopnea, PND, exertional dyspnea or chest discomfort.  PHYSICAL EXAM: BP 148/91  Pulse 67  Ht 6\' 2"  (1.88 m)  Wt 106.142 kg (234 lb)  BMI 30.04 kg/m2  General-Well developed; no acute distress Body habitus-Mildly overweight Neck-No JVD; no carotid bruits Lungs-clear lung fields; resonant to percussion Cardiovascular-normal PMI; normal S1 and S2; modest early systolic murmur; regular rhythm Abdomen-normal bowel sounds; soft and non-tender without masses or organomegaly Musculoskeletal-No deformities, no cyanosis or clubbing Neurologic-Normal cranial nerves; symmetric strength and tone Skin-Warm, no significant lesions Extremities-distal pulses intact; no edema  Rhythm Strip: Normal sinus rhythm at a rate of 64 bpm.  Intra-atrial conduction delay.  ASSESSMENT AND PLAN:  [place] Bing, MD 08/29/2011  4:32 PM

## 2011-08-29 NOTE — Progress Notes (Deleted)
**Note De-Identified [name] Cook Obfuscation** Name: [name] Cook    DOB: Apr 28, 1950  Age: 61 y.o.  MR#: [id number]       PCP:  Provider Not In System      Insurance: @PAYORNAME @   CC:    Chief Complaint  Patient presents with  . 1 month f/u    Pt. has no complaints at this time. List+    VS BP 148/91  Pulse 67  Ht 6\' 2"  (1.88 m)  Wt 234 lb (106.142 kg)  BMI 30.04 kg/m2  Weights Current Weight  08/29/11 234 lb (106.142 kg)  08/11/11 233 lb (105.688 kg)  07/17/11 231 lb (104.781 kg)    Blood Pressure  BP Readings from Last 3 Encounters:  08/29/11 148/91  08/11/11 136/84  07/17/11 117/71     Admit date:  (Not on file) Last encounter with RMR:  08/25/2011   Allergy No Known Allergies  Current Outpatient Prescriptions  Medication Sig Dispense Refill  . amiodarone (PACERONE) 200 MG tablet Take 200 mg twice a day for 2 weeks then once a day thereafter  45 tablet  3  . buPROPion (WELLBUTRIN) 100 MG tablet Take 100 mg by mouth 2 (two) times daily.      . celecoxib (CELEBREX) 200 MG capsule Take 1 capsule (200 mg total) by mouth daily.  30 capsule  0  . diltiazem (TIAZAC) 360 MG 24 hr capsule Take 1 capsule (360 mg total) by mouth daily.  90 capsule  3  . fish oil-omega-3 fatty acids 1000 MG capsule Take 1 g by mouth daily.        . Fluticasone-Salmeterol (ADVAIR) 250-50 MCG/DOSE AEPB Inhale 2 puffs into the lungs 2 (two) times daily. With spacer  60 each  0  . furosemide (LASIX) 20 MG tablet Take 20 mg by mouth as needed.      . metoprolol succinate (TOPROL-XL) 50 MG 24 hr tablet Take 75 mg (1 2/2 tablets)  45 tablet  12  . nitroGLYCERIN (NITROSTAT) 0.4 MG SL tablet Place 1 tablet (0.4 mg total) under the tongue every 5 (five) minutes x 3 doses as needed for chest pain.  25 tablet  3  . pantoprazole (PROTONIX) 40 MG tablet Take 1 tablet (40 mg total) by mouth daily.  30 tablet  0  . rosuvastatin (CRESTOR) 20 MG tablet Take 1 tablet (20 mg total) by mouth at bedtime.  30 tablet  12  . sitaGLIPtan-metformin (JANUMET) 50-1000 MG  per tablet Take 1 tablet by mouth 2 (two) times daily with a meal.  60 tablet  0  . warfarin (COUMADIN) 2.5 MG tablet Take 1 tablet (2.5 mg total) by mouth daily. Take 1 tablet daily except 2 tablets on Mondays and Thursdays  45 tablet  3  . DISCONTD: lisinopril (PRINIVIL,ZESTRIL) 10 MG tablet Take 1 tablet (10 mg total) by mouth daily.  30 tablet  3    Discontinued Meds:   There are no discontinued medications.  Patient Active Problem List  Diagnoses  . GASTROESOPHAGEAL REFLUX DISEASE  . Chronic anticoagulation  . Persistent atrial fibrillation  . Hypertension  . Hyperlipidemia  . Diabetes mellitus, type 2  . Atrial flutter  . Obesity  . Tobacco abuse  . Chronic obstructive pulmonary disease    LABS Anti-coag visit on 08/24/2011  Component Date Value  . INR 08/24/2011 1.9   Anti-coag visit on 08/16/2011  Component Date Value  . INR 08/16/2011 1.1   Orders Only on 08/10/2011  Component Date Value  . Sodium **Note De-Identified [name] Cook Obfuscation** 08/24/2011 139   . Potassium 08/24/2011 4.5   . Chloride 08/24/2011 104   . CO2 08/24/2011 24   . Glucose, Bld 08/24/2011 122*  . BUN 08/24/2011 15   . Creat 08/24/2011 0.90   . Total Bilirubin 08/24/2011 0.3   . Alkaline Phosphatase 08/24/2011 59   . AST 08/24/2011 19   . ALT 08/24/2011 22   . Total Protein 08/24/2011 6.1   . Albumin 08/24/2011 4.3   . Calcium 08/24/2011 9.1   . TSH 08/24/2011 3.613   . WBC 08/24/2011 7.8   . RBC 08/24/2011 4.44   . Hemoglobin 08/24/2011 14.0   . HCT 08/24/2011 42.0   . MCV 08/24/2011 94.6   . [facility] 08/24/2011 31.5   . MCHC 08/24/2011 33.3   . RDW 08/24/2011 14.0   . Platelets 08/24/2011 172   Anti-coag visit on 07/17/2011  Component Date Value  . INR 07/17/2011 2.5   Anti-coag visit on 06/26/2011  Component Date Value  . INR 06/26/2011 2.5   Anti-coag visit on 06/15/2011  Component Date Value  . INR 06/15/2011 2.3   Admission on 06/06/2011, Discharged on 06/09/2011  Component Date Value  . Sodium 06/06/2011 139   .  Potassium 06/06/2011 4.0   . Chloride 06/06/2011 102   . CO2 06/06/2011 25   . Glucose, Bld 06/06/2011 182*  . BUN 06/06/2011 15   . Creatinine, Ser 06/06/2011 0.79   . Calcium 06/06/2011 9.6   . GFR calc non Af Amer 06/06/2011 >90   . GFR calc Af Amer 06/06/2011 >90   . Digoxin Level 06/06/2011 1.3   . Glucose-Capillary 06/06/2011 166*  . Comment 1 06/06/2011 Notify RN   . Prothrombin Time 06/06/2011 27.8*  . INR 06/06/2011 2.54*  . Magnesium 06/06/2011 1.4*  . Magnesium 06/07/2011 1.8   . Sodium 06/07/2011 140   . Potassium 06/07/2011 3.5   . Chloride 06/07/2011 104   . CO2 06/07/2011 26   . Glucose, Bld 06/07/2011 144*  . BUN 06/07/2011 14   . Creatinine, Ser 06/07/2011 0.76   . Calcium 06/07/2011 9.9   . GFR calc non Af Amer 06/07/2011 >90   . GFR calc Af Amer 06/07/2011 >90   . Prothrombin Time 06/07/2011 31.9*  . INR 06/07/2011 3.03*  . Magnesium 06/06/2011 1.8   . Glucose-Capillary 06/06/2011 110*  . Comment 1 06/06/2011 Documented in Chart   . Comment 2 06/06/2011 Notify RN   . Glucose-Capillary 06/07/2011 157*  . Comment 1 06/07/2011 Notify RN   . Glucose-Capillary 06/07/2011 140*  . Comment 1 06/07/2011 Notify RN   . Glucose-Capillary 06/07/2011 140*  . Comment 1 06/07/2011 Documented in Chart   . Comment 2 06/07/2011 Notify RN   . Magnesium 06/08/2011 1.9   . Prothrombin Time 06/08/2011 30.4*  . INR 06/08/2011 2.85*  . Glucose-Capillary 06/07/2011 104*  . Comment 1 06/07/2011 Documented in Chart   . Comment 2 06/07/2011 Notify RN   . Glucose-Capillary 06/08/2011 148*  . Comment 1 06/08/2011 Notify RN   . Glucose-Capillary 06/08/2011 203*  . Comment 1 06/08/2011 Notify RN   . Glucose-Capillary 06/08/2011 120*  . Comment 1 06/08/2011 Documented in Chart   . Comment 2 06/08/2011 Notify RN   . Magnesium 06/09/2011 1.8   . Prothrombin Time 06/09/2011 28.9*  . INR 06/09/2011 2.67*  . Sodium 06/09/2011 139   . Potassium 06/09/2011 4.0   . Chloride  06/09/2011 101   . CO2 06/09/2011 25   . Glucose, Bld 06/09/2011 **Note De-Identified [name] Cook Obfuscation** 158*  . BUN 06/09/2011 12   . Creatinine, Ser 06/09/2011 0.73   . Calcium 06/09/2011 10.1   . GFR calc non Af Amer 06/09/2011 >90   . GFR calc Af Amer 06/09/2011 >90   . Sodium 06/08/2011 139   . Potassium 06/08/2011 4.0   . Chloride 06/08/2011 101   . CO2 06/08/2011 27   . Glucose, Bld 06/08/2011 102*  . BUN 06/08/2011 12   . Creatinine, Ser 06/08/2011 0.82   . Calcium 06/08/2011 10.2   . Total Protein 06/08/2011 6.6   . Albumin 06/08/2011 3.8   . AST 06/08/2011 19   . ALT 06/08/2011 29   . Alkaline Phosphatase 06/08/2011 65   . Total Bilirubin 06/08/2011 0.3   . GFR calc non Af Amer 06/08/2011 >90   . GFR calc Af Amer 06/08/2011 >90   . WBC 06/08/2011 7.0   . RBC 06/08/2011 4.25   . Hemoglobin 06/08/2011 13.4   . HCT 06/08/2011 38.7*  . MCV 06/08/2011 91.1   . [facility] 06/08/2011 31.5   . MCHC 06/08/2011 34.6   . RDW 06/08/2011 14.1   . Platelets 06/08/2011 145*  . Glucose-Capillary 06/08/2011 97   . Comment 1 06/08/2011 Documented in Chart   . Comment 2 06/08/2011 Notify RN   . Glucose-Capillary 06/09/2011 162*  . Glucose-Capillary 06/09/2011 176*  . Comment 1 06/09/2011 Notify RN   Anti-coag visit on 06/05/2011  Component Date Value  . INR 06/05/2011 3.0   Admission on 06/04/2011, Discharged on 06/04/2011  Component Date Value  . WBC 06/04/2011 5.6   . RBC 06/04/2011 4.50   . Hemoglobin 06/04/2011 14.0   . HCT 06/04/2011 40.9   . MCV 06/04/2011 90.9   . [facility] 06/04/2011 31.1   . MCHC 06/04/2011 34.2   . RDW 06/04/2011 14.0   . Platelets 06/04/2011 145*  . Neutrophils Relative 06/04/2011 51   . Neutro Abs 06/04/2011 2.8   . Lymphocytes Relative 06/04/2011 34   . Lymphs Abs 06/04/2011 1.9   . Monocytes Relative 06/04/2011 9   . Monocytes Absolute 06/04/2011 0.5   . Eosinophils Relative 06/04/2011 5   . Eosinophils Absolute 06/04/2011 0.3   . Basophils Relative 06/04/2011 1   . Basophils Absolute  06/04/2011 0.1   . Sodium 06/04/2011 135   . Potassium 06/04/2011 4.3   . Chloride 06/04/2011 99   . CO2 06/04/2011 26   . Glucose, Bld 06/04/2011 326*  . BUN 06/04/2011 15   . Creatinine, Ser 06/04/2011 0.72   . Calcium 06/04/2011 10.4   . GFR calc non Af Amer 06/04/2011 >90   . GFR calc Af Amer 06/04/2011 >90   . Pro B Natriuretic peptid* 06/04/2011 378.5*  . aPTT 06/04/2011 46*  . Prothrombin Time 06/04/2011 34.3*  . INR 06/04/2011 3.33*  . Digoxin Level 06/04/2011 0.8      Results for this Opt Visit:     Results for orders placed in visit on 08/24/11  POCT INR      Component Value Range   INR 1.9      EKG Orders placed in visit on 08/11/11  . EKG 12-LEAD     Prior Assessment and Plan Problem List as of 08/29/2011          Cardiology Problems   Persistent atrial fibrillation   Last Assessment & Plan Note   08/11/2011 Office Visit Signed 08/13/2011  3:29 PM by Hillis Range, MD    The patient has persistent atrial fibrillation. **Note De-Identified Augustina Braddock Obfuscation** He has previously failed medical therapy with tikosyn.  He is presently maintaining sinus rhythm with amiodarone and is tolerated this medicine well.   Therapeutic strategies for afib including medicine and ablation were discussed in detail with the patient today. Risk, benefits, and alternatives to EP study and radiofrequency ablation for afib were also discussed in detail today. At this point, he would like to continue medical therapy. No changes were made today.  I will see him again in 3 months.  IF he is doing well at that time then I will return his care to Drs [name] and Ladona Ridgel.    Hypertension   Last Assessment & Plan Note   07/04/2011 Office Visit Signed 07/04/2011  7:10 PM by Kathlen Brunswick, MD    Blood pressure has been on the low side with current medication.  ACE Inhibitor will be discontinued.    Hyperlipidemia   Last Assessment & Plan Note   05/26/2011 Office Visit Addendum 05/26/2011  3:35 PM by Kathlen Brunswick, MD    Control  of hyperlipidemia is superlative; current therapy will be continued.    Atrial flutter   Last Assessment & Plan Note   06/15/2011 Office Visit Addendum 06/15/2011  9:56 PM by Kathlen Brunswick, MD    Unfortunate, atrial arrhythmias have recurred.  While atrial flutter has replaced atrial fibrillation since dofetilide was started, current flutter is atypical and almost certainly not a recurrence of the arrhythmia that was ablated in 2002.  A repeat ablation is a consideration as well as change to a different antiarrhythmic agent.  It may be that he will tolerate this rhythm better than atrial fibrillation once adequate control of heart rate has been achieved.  For now, we will institute a rate control strategy by restarting digoxin and increasing his dose of metoprolol.  He will return in one week for reassessment of his heart rate  blood pressure and symptoms by the cardiology nurses.  A return visit to Dr. Ladona Ridgel will also be scheduled.      Other   GASTROESOPHAGEAL REFLUX DISEASE   Chronic anticoagulation   Last Assessment & Plan Note   07/17/2011 Office Visit Signed 07/18/2011  8:14 PM by Kathlen Brunswick, MD    Management of warfarin dosing will be continued.  No evidence for occult GI blood loss.    Diabetes mellitus, type 2   Obesity   Tobacco abuse   Chronic obstructive pulmonary disease   Last Assessment & Plan Note   04/20/2011 Office Visit Signed 04/23/2011 10:03 AM by Kathlen Brunswick, MD    Active bronchospasm with symptoms.  Advair will be added to patient's regimen.  Health Department verifies that this medication can be supplied to them and no cost.  Dose of metoprolol will be decreased substantially and it may be necessary to discontinue this drug entirely.        Imaging: No results found.   FRS Calculation: Score not calculated. Missing: Total Cholesterol

## 2011-08-29 NOTE — Assessment & Plan Note (Signed)
It appears that amiodarone continues to be efficacious.  Current dosage will be continued and appropriate monitoring studies performed.

## 2011-08-29 NOTE — Assessment & Plan Note (Signed)
Level of anticoagulation has been variable with the INRs 1.1-2.5 over the past 6 weeks.  We will continue to follow in our anticoagulation clinic and attempt to achieve a stable therapeutic treatment regimen.

## 2011-09-13 ENCOUNTER — Ambulatory Visit (INDEPENDENT_AMBULATORY_CARE_PROVIDER_SITE_OTHER): Payer: Medicaid Other | Admitting: *Deleted

## 2011-09-13 DIAGNOSIS — I4891 Unspecified atrial fibrillation: Secondary | ICD-10-CM

## 2011-09-13 DIAGNOSIS — Z7901 Long term (current) use of anticoagulants: Secondary | ICD-10-CM

## 2011-09-13 DIAGNOSIS — I4892 Unspecified atrial flutter: Secondary | ICD-10-CM

## 2011-09-13 DIAGNOSIS — I4819 Other persistent atrial fibrillation: Secondary | ICD-10-CM

## 2011-09-13 LAB — POCT INR: INR: 1.6

## 2011-09-27 ENCOUNTER — Ambulatory Visit (INDEPENDENT_AMBULATORY_CARE_PROVIDER_SITE_OTHER): Payer: Self-pay | Admitting: *Deleted

## 2011-09-27 DIAGNOSIS — I4819 Other persistent atrial fibrillation: Secondary | ICD-10-CM

## 2011-09-27 DIAGNOSIS — Z7901 Long term (current) use of anticoagulants: Secondary | ICD-10-CM

## 2011-09-27 DIAGNOSIS — I4891 Unspecified atrial fibrillation: Secondary | ICD-10-CM

## 2011-09-27 DIAGNOSIS — I4892 Unspecified atrial flutter: Secondary | ICD-10-CM

## 2011-09-27 LAB — POCT INR: INR: 2.7

## 2011-10-18 ENCOUNTER — Other Ambulatory Visit: Payer: Self-pay | Admitting: *Deleted

## 2011-10-18 DIAGNOSIS — Z7901 Long term (current) use of anticoagulants: Secondary | ICD-10-CM

## 2011-10-18 DIAGNOSIS — I10 Essential (primary) hypertension: Secondary | ICD-10-CM

## 2011-10-18 DIAGNOSIS — I4819 Other persistent atrial fibrillation: Secondary | ICD-10-CM

## 2011-10-18 DIAGNOSIS — R5383 Other fatigue: Secondary | ICD-10-CM

## 2011-10-23 ENCOUNTER — Ambulatory Visit (INDEPENDENT_AMBULATORY_CARE_PROVIDER_SITE_OTHER): Payer: Self-pay | Admitting: *Deleted

## 2011-10-23 DIAGNOSIS — Z7901 Long term (current) use of anticoagulants: Secondary | ICD-10-CM

## 2011-10-23 DIAGNOSIS — I4891 Unspecified atrial fibrillation: Secondary | ICD-10-CM

## 2011-10-23 DIAGNOSIS — I4819 Other persistent atrial fibrillation: Secondary | ICD-10-CM

## 2011-10-23 DIAGNOSIS — I4892 Unspecified atrial flutter: Secondary | ICD-10-CM

## 2011-10-23 LAB — POCT INR: INR: 1.7

## 2011-10-30 ENCOUNTER — Ambulatory Visit (HOSPITAL_COMMUNITY)
Admission: RE | Admit: 2011-10-30 | Discharge: 2011-10-30 | Disposition: A | Discharge status: Home / Self Care | Payer: Self-pay | Source: Ambulatory Visit | Attending: Cardiology | Admitting: Cardiology

## 2011-10-30 DIAGNOSIS — I10 Essential (primary) hypertension: Secondary | ICD-10-CM | POA: Insufficient documentation

## 2011-10-30 DIAGNOSIS — I4891 Unspecified atrial fibrillation: Secondary | ICD-10-CM | POA: Insufficient documentation

## 2011-10-30 DIAGNOSIS — Z7901 Long term (current) use of anticoagulants: Secondary | ICD-10-CM | POA: Insufficient documentation

## 2011-10-31 ENCOUNTER — Encounter: Payer: Self-pay | Admitting: Cardiology

## 2011-10-31 LAB — COMPREHENSIVE METABOLIC PANEL
ALT: 27 U/L (ref 0–53)
AST: 25 U/L (ref 0–37)
Albumin: 4.3 g/dL (ref 3.5–5.2)
Alkaline Phosphatase: 57 U/L (ref 39–117)
BUN: 17 mg/dL (ref 6–23)
CO2: 24 mEq/L (ref 19–32)
Calcium: 9.4 mg/dL (ref 8.4–10.5)
Chloride: 105 mEq/L (ref 96–112)
Creat: 1.34 mg/dL (ref 0.50–1.35)
Glucose, Bld: 114 mg/dL — ABNORMAL HIGH (ref 70–99)
Potassium: 4 mEq/L (ref 3.5–5.3)
Sodium: 144 mEq/L (ref 135–145)
Total Bilirubin: 0.4 mg/dL (ref 0.3–1.2)
Total Protein: 6.6 g/dL (ref 6.0–8.3)

## 2011-10-31 LAB — LIPID PANEL
Cholesterol: 137 mg/dL (ref 0–200)
HDL: 45 mg/dL (ref >39)
LDL Cholesterol: 46 mg/dL (ref 0–99)
Total CHOL/HDL Ratio: 3 Ratio
Triglycerides: 228 mg/dL — ABNORMAL HIGH (ref <150)
VLDL: 46 mg/dL — ABNORMAL HIGH (ref 0–40)

## 2011-10-31 LAB — TSH: TSH: 3.66 u[IU]/mL (ref 0.350–4.500)

## 2011-11-13 ENCOUNTER — Ambulatory Visit: Payer: Self-pay | Admitting: Internal Medicine

## 2011-11-30 ENCOUNTER — Encounter: Payer: Self-pay | Admitting: Cardiology

## 2011-11-30 ENCOUNTER — Ambulatory Visit (INDEPENDENT_AMBULATORY_CARE_PROVIDER_SITE_OTHER): Payer: Self-pay | Admitting: Cardiology

## 2011-11-30 VITALS — BP 150/100 | HR 62 | Ht 72.0 in | Wt 239.8 lb

## 2011-11-30 DIAGNOSIS — Z5181 Encounter for therapeutic drug level monitoring: Secondary | ICD-10-CM

## 2011-11-30 DIAGNOSIS — Z79899 Other long term (current) drug therapy: Secondary | ICD-10-CM

## 2011-11-30 DIAGNOSIS — I10 Essential (primary) hypertension: Secondary | ICD-10-CM

## 2011-11-30 MED ORDER — CHLORTHALIDONE 25 MG PO TABS: 12.5000 mg | ORAL_TABLET | Freq: Every day | ORAL | Status: DC

## 2011-11-30 NOTE — Progress Notes (Deleted)
Name: [name] Cook    DOB: 1951-01-12  Age: 61 y.o.  MR#: [id number]       PCP:  Provider Not In System      Insurance: @PAYORNAME @   CC:   No chief complaint on file.   VS BP 150/100  Pulse 62  Ht 6' (1.829 m)  Wt 239 lb 12.8 oz (108.773 kg)  BMI 32.52 kg/m2  SpO2 92%  Weights Current Weight  11/30/11 239 lb 12.8 oz (108.773 kg)  08/29/11 234 lb (106.142 kg)  08/11/11 233 lb (105.688 kg)    Blood Pressure  BP Readings from Last 3 Encounters:  11/30/11 150/100  08/29/11 148/91  08/11/11 136/84     Admit date:  (Not on file) Last encounter with RMR:  10/31/2011   Allergy No Known Allergies  Current Outpatient Prescriptions  Medication Sig Dispense Refill  . amiodarone (PACERONE) 200 MG tablet Take 200 mg twice a day for 2 weeks then once a day thereafter  45 tablet  3  . celecoxib (CELEBREX) 200 MG capsule Take 1 capsule (200 mg total) by mouth daily.  30 capsule  0  . citalopram (CELEXA) 20 MG tablet Take 20 mg by mouth daily.      Marland Kitchen diltiazem (TIAZAC) 360 MG 24 hr capsule Take 1 capsule (360 mg total) by mouth daily.  90 capsule  3  . fish oil-omega-3 fatty acids 1000 MG capsule Take 1 g by mouth daily.        . Fluticasone-Salmeterol (ADVAIR) 250-50 MCG/DOSE AEPB Inhale 2 puffs into the lungs 2 (two) times daily. With spacer  60 each  0  . furosemide (LASIX) 20 MG tablet Take 20 mg by mouth as needed.      . metoprolol succinate (TOPROL-XL) 50 MG 24 hr tablet Take 75 mg (1 2/2 tablets)  45 tablet  12  . nitroGLYCERIN (NITROSTAT) 0.4 MG SL tablet Place 1 tablet (0.4 mg total) under the tongue every 5 (five) minutes x 3 doses as needed for chest pain.  25 tablet  3  . pantoprazole (PROTONIX) 40 MG tablet Take 1 tablet (40 mg total) by mouth daily.  30 tablet  0  . rosuvastatin (CRESTOR) 20 MG tablet Take 1 tablet (20 mg total) by mouth at bedtime.  30 tablet  12  . sitaGLIPtan-metformin (JANUMET) 50-1000 MG per tablet Take 1 tablet by mouth 2 (two) times daily with a meal.   60 tablet  0  . warfarin (COUMADIN) 2.5 MG tablet Take 1 tablet (2.5 mg total) by mouth daily. Take 1 tablet daily except 2 tablets on Mondays and Thursdays  45 tablet  3  . DISCONTD: lisinopril (PRINIVIL,ZESTRIL) 10 MG tablet Take 1 tablet (10 mg total) by mouth daily.  30 tablet  3    Discontinued Meds:   There are no discontinued medications.  Patient Active Problem List  Diagnosis  . GASTROESOPHAGEAL REFLUX DISEASE  . Chronic anticoagulation  . Persistent atrial fibrillation  . Hypertension  . Hyperlipidemia  . Diabetes mellitus, type 2  . Atrial flutter  . Tobacco abuse  . Chronic obstructive pulmonary disease    LABS Anti-coag visit on 10/23/2011  Component Date Value  . INR 10/23/2011 1.7   Orders Only on 10/18/2011  Component Date Value  . TSH 10/18/2011 3.660   . Cholesterol 10/18/2011 137   . Triglycerides 10/18/2011 228*  . HDL 10/18/2011 45   . Total CHOL/HDL Ratio 10/18/2011 3.0   . VLDL 10/18/2011 46*  .  LDL Cholesterol 10/18/2011 46   . Sodium 10/18/2011 144   . Potassium 10/18/2011 4.0   . Chloride 10/18/2011 105   . CO2 10/18/2011 24   . Glucose, Bld 10/18/2011 114*  . BUN 10/18/2011 17   . Creat 10/18/2011 1.34   . Total Bilirubin 10/18/2011 0.4   . Alkaline Phosphatase 10/18/2011 57   . AST 10/18/2011 25   . ALT 10/18/2011 27   . Total Protein 10/18/2011 6.6   . Albumin 10/18/2011 4.3   . Calcium 10/18/2011 9.4   Anti-coag visit on 09/27/2011  Component Date Value  . INR 09/27/2011 2.7   Anti-coag visit on 09/13/2011  Component Date Value  . INR 09/13/2011 1.6      Results for this Opt Visit:     Results for orders placed in visit on 10/23/11  POCT INR      Component Value Range   INR 1.7      EKG Orders placed in visit on 08/11/11  . EKG 12-LEAD     Prior Assessment and Plan Problem List as of 11/30/2011            Cardiology Problems   Persistent atrial fibrillation   Last Assessment & Plan Note   08/29/2011 Office Visit  Signed 08/29/2011  4:41 PM by Kathlen Brunswick, MD    It appears that amiodarone continues to be efficacious.  Current dosage will be continued and appropriate monitoring studies performed.    Hypertension   Last Assessment & Plan Note   08/29/2011 Office Visit Signed 08/29/2011  4:40 PM by Kathlen Brunswick, MD    Blood pressure is marginal today, but has been well controlled on multiple determinations over the past few months.  Current regimen will be continued and surveillance continued as well.    Hyperlipidemia   Last Assessment & Plan Note   05/26/2011 Office Visit Addendum 05/26/2011  3:35 PM by Kathlen Brunswick, MD    Control of hyperlipidemia is superlative; current therapy will be continued.    Atrial flutter   Last Assessment & Plan Note   06/15/2011 Office Visit Addendum 06/15/2011  9:56 PM by Kathlen Brunswick, MD    Unfortunate, atrial arrhythmias have recurred.  While atrial flutter has replaced atrial fibrillation since dofetilide was started, current flutter is atypical and almost certainly not a recurrence of the arrhythmia that was ablated in 2002.  A repeat ablation is a consideration as well as change to a different antiarrhythmic agent.  It may be that he will tolerate this rhythm better than atrial fibrillation once adequate control of heart rate has been achieved.  For now, we will institute a rate control strategy by restarting digoxin and increasing his dose of metoprolol.  He will return in one week for reassessment of his heart rate  blood pressure and symptoms by the cardiology nurses.  A return visit to Dr. Ladona Ridgel will also be scheduled.      Other   GASTROESOPHAGEAL REFLUX DISEASE   Chronic anticoagulation   Last Assessment & Plan Note   08/29/2011 Office Visit Signed 08/29/2011  4:39 PM by Kathlen Brunswick, MD    Level of anticoagulation has been variable with the INRs 1.1-2.5 over the past 6 weeks.  We will continue to follow in our anticoagulation clinic and attempt  to achieve a stable therapeutic treatment regimen.    Diabetes mellitus, type 2   Tobacco abuse   Chronic obstructive pulmonary disease   Last Assessment &  Plan Note   04/20/2011 Office Visit Signed 04/23/2011 10:03 AM by Kathlen Brunswick, MD    Active bronchospasm with symptoms.  Advair will be added to patient's regimen.  Health Department verifies that this medication can be supplied to them and no cost.  Dose of metoprolol will be decreased substantially and it may be necessary to discontinue this drug entirely.        Imaging: No results found.   FRS Calculation: Score not calculated. Missing: Total Cholesterol

## 2011-11-30 NOTE — Assessment & Plan Note (Signed)
Symptoms are well controlled with amiodarone therapy.  No adverse effects are apparent by recent testing that included a CMet, TSH and CBC.  Chest x-ray and repeat testing will be obtained in 5 months.

## 2011-11-30 NOTE — Progress Notes (Signed)
Patient ID: [name] Cook, male   DOB: Apr 15, 1951, 61 y.o.   MRN: [id number]  HPI: Scheduled return visit for this nice gentleman with a history of persistent atrial fibrillation recently controlled with amiodarone.  He has done fine since his last visit, experiencing no palpitations, lightheadedness or syncope.  He does have chronic class II dyspnea on exertion, which is unchanged.  Prior to Admission medications   Medication Sig Start Date End Date Taking? Authorizing Provider  amiodarone (PACERONE) 200 MG tablet Take 200 mg twice a day for 2 weeks then once a day thereafter 07/17/11  Yes Kathlen Brunswick, MD  celecoxib (CELEBREX) 200 MG capsule Take 1 capsule (200 mg total) by mouth daily. 06/20/11  Yes Kathlen Brunswick, MD  citalopram (CELEXA) 20 MG tablet Take 20 mg by mouth daily.   Yes Historical Provider, MD  diltiazem (TIAZAC) 360 MG 24 hr capsule Take 1 capsule (360 mg total) by mouth daily. 05/05/11 05/04/12 Yes Kathlen Brunswick, MD  fish oil-omega-3 fatty acids 1000 MG capsule Take 1 g by mouth daily.     Yes Historical Provider, MD  Fluticasone-Salmeterol (ADVAIR) 250-50 MCG/DOSE AEPB Inhale 2 puffs into the lungs 2 (two) times daily. With spacer 06/20/11  Yes Kathlen Brunswick, MD  furosemide (LASIX) 20 MG tablet Take 20 mg by mouth as needed. 05/05/11 05/04/12 Yes Kathlen Brunswick, MD  metoprolol succinate (TOPROL-XL) 50 MG 24 hr tablet Take 75 mg (1 2/2 tablets) 07/07/11  Yes Kathlen Brunswick, MD  nitroGLYCERIN (NITROSTAT) 0.4 MG SL tablet Place 1 tablet (0.4 mg total) under the tongue every 5 (five) minutes x 3 doses as needed for chest pain. 06/09/11 06/08/12 Yes Ok Anis, NP  pantoprazole (PROTONIX) 40 MG tablet Take 1 tablet (40 mg total) by mouth daily. 06/20/11  Yes Kathlen Brunswick, MD  rosuvastatin (CRESTOR) 20 MG tablet Take 1 tablet (20 mg total) by mouth at bedtime. 06/19/11  Yes Jodelle Gross, NP  sitaGLIPtan-metformin (JANUMET) 50-1000 MG per tablet Take 1 tablet by  mouth 2 (two) times daily with a meal. 08/07/11  Yes Kathlen Brunswick, MD  warfarin (COUMADIN) 2.5 MG tablet Take 1 tablet (2.5 mg total) by mouth daily. Take 1 tablet daily except 2 tablets on Mondays and Thursdays 08/24/11  Yes Kathlen Brunswick, MD   No Known Allergies    Past medical history, social history, and family history reviewed and updated.  ROS: Denies chest discomfort, orthopnea, PND, pedal edema, sun sensitivity, jaundice.  All other systems reviewed and are negative.  PHYSICAL EXAM: BP 150/100  Pulse 62  Ht 6' (1.829 m)  Wt 108.773 kg (239 lb 12.8 oz)  BMI 32.52 kg/m2  SpO2 92%  General-Well developed; no acute distress Body habitus-Overweight Neck-No JVD; no carotid bruits Lungs-clear lung fields; resonant to percussion Cardiovascular-normal PMI; normal S1 and S2; minimal early systolic murmur Abdomen-normal bowel sounds; soft and non-tender without masses or organomegaly Musculoskeletal-No deformities, no cyanosis or clubbing Neurologic-Normal cranial nerves; symmetric strength and tone Skin-Warm, no significant lesions Extremities-distal pulses intact; no edema  Rhythm Strip: Normal sinus rhythm at a rate of 61 bpm; borderline first degree AV block.  ASSESSMENT AND PLAN:  [place] Bing, MD 11/30/2011 11:04 AM

## 2011-11-30 NOTE — Assessment & Plan Note (Signed)
Hypertension has been suboptimally controlled; low-dose thiazide diuretic will be added to his medical regime with a 24-hour blood pressure monitor + metabolic profile to be performed in one month

## 2011-11-30 NOTE — Assessment & Plan Note (Signed)
INRs continue to be variable, but are improved, ranging between 1.6-2.7.  We will continue to monitor closely and adjust warfarin dosage in anticoagulation clinic.

## 2011-11-30 NOTE — Patient Instructions (Addendum)
Your physician recommends that you schedule a follow-up appointment in: 8 months  24 hour blood pressure monitor in 1 month  Your physician recommends that you return for lab work in: in 1 month and 5 months (to include Chest x ray)  Your physician has recommended you make the following change in your medication:  1 - ADD Chlorthalidone 12.5 mg daily

## 2011-12-05 ENCOUNTER — Encounter: Payer: Self-pay | Admitting: *Deleted

## 2011-12-08 ENCOUNTER — Telehealth: Payer: Self-pay | Admitting: *Deleted

## 2011-12-08 NOTE — Telephone Encounter (Signed)
Increase chlorthalidone to 25 mg per day.

## 2011-12-08 NOTE — Telephone Encounter (Signed)
Patient called with blood pressure of 170/102 per [facility] cuff.  Was seen in office on 8/15 and started on Chlorthalidone 12.5 mg daily.  Is awaiting a 24 hour blood pressure monitor.  Please advise

## 2011-12-11 ENCOUNTER — Ambulatory Visit (INDEPENDENT_AMBULATORY_CARE_PROVIDER_SITE_OTHER): Payer: Self-pay | Admitting: *Deleted

## 2011-12-11 DIAGNOSIS — I4892 Unspecified atrial flutter: Secondary | ICD-10-CM

## 2011-12-11 DIAGNOSIS — Z7901 Long term (current) use of anticoagulants: Secondary | ICD-10-CM

## 2011-12-11 DIAGNOSIS — I4819 Other persistent atrial fibrillation: Secondary | ICD-10-CM

## 2011-12-11 DIAGNOSIS — I4891 Unspecified atrial fibrillation: Secondary | ICD-10-CM

## 2011-12-11 LAB — POCT INR: INR: 4.3

## 2011-12-11 MED ORDER — CHLORTHALIDONE 25 MG PO TABS: 25.0000 mg | ORAL_TABLET | Freq: Every day | ORAL | Status: DC

## 2011-12-11 NOTE — Telephone Encounter (Signed)
Patient notified of change.

## 2011-12-19 ENCOUNTER — Encounter: Payer: Self-pay | Admitting: *Deleted

## 2011-12-19 ENCOUNTER — Encounter: Payer: Self-pay | Admitting: Internal Medicine

## 2011-12-19 ENCOUNTER — Ambulatory Visit (INDEPENDENT_AMBULATORY_CARE_PROVIDER_SITE_OTHER): Payer: Self-pay | Admitting: Internal Medicine

## 2011-12-19 ENCOUNTER — Ambulatory Visit: Payer: Self-pay | Admitting: Internal Medicine

## 2011-12-19 VITALS — BP 120/70 | HR 68 | Ht 72.0 in | Wt 233.2 lb

## 2011-12-19 DIAGNOSIS — R0789 Other chest pain: Secondary | ICD-10-CM | POA: Insufficient documentation

## 2011-12-19 DIAGNOSIS — R079 Chest pain, unspecified: Secondary | ICD-10-CM

## 2011-12-19 DIAGNOSIS — I4819 Other persistent atrial fibrillation: Secondary | ICD-10-CM

## 2011-12-19 DIAGNOSIS — I4892 Unspecified atrial flutter: Secondary | ICD-10-CM

## 2011-12-19 DIAGNOSIS — I4891 Unspecified atrial fibrillation: Secondary | ICD-10-CM

## 2011-12-19 NOTE — Progress Notes (Signed)
HPI [name] returns today for followup. He is a 61 year old man with a history of persistent atrial fibrillation who has been maintained in sinus rhythm for the last several months on amiodarone. He has had no recurrent tachypalpitations. The patient has developed episodes of substernal chest pain. These can occur with exertion as well as at other times. He notes that they have been relieved with nitroglycerin. At other times his symptoms have occurred with rest. They typically last 5 or 10 minutes. The patient notes that he had "mild blockages" at catheterization in 2002. He denies syncope. No peripheral edema. No cough or hemoptysis. No Known Allergies   Current Outpatient Prescriptions  Medication Sig Dispense Refill  . amiodarone (PACERONE) 200 MG tablet Take 200 mg twice a day for 2 weeks then once a day thereafter  45 tablet  3  . celecoxib (CELEBREX) 200 MG capsule Take 1 capsule (200 mg total) by mouth daily.  30 capsule  0  . chlorthalidone (HYGROTON) 25 MG tablet Take 1 tablet (25 mg total) by mouth daily.  30 tablet  11  . citalopram (CELEXA) 20 MG tablet Take 20 mg by mouth daily.      Marland Kitchen diltiazem (TIAZAC) 360 MG 24 hr capsule Take 1 capsule (360 mg total) by mouth daily.  90 capsule  3  . fish oil-omega-3 fatty acids 1000 MG capsule Take 1 g by mouth daily.        . Fluticasone-Salmeterol (ADVAIR) 250-50 MCG/DOSE AEPB Inhale 2 puffs into the lungs 2 (two) times daily. With spacer  60 each  0  . furosemide (LASIX) 20 MG tablet Take 20 mg by mouth as needed.      . metoprolol succinate (TOPROL-XL) 50 MG 24 hr tablet Take 75 mg (1 2/2 tablets)  45 tablet  12  . nitroGLYCERIN (NITROSTAT) 0.4 MG SL tablet Place 1 tablet (0.4 mg total) under the tongue every 5 (five) minutes x 3 doses as needed for chest pain.  25 tablet  3  . pantoprazole (PROTONIX) 40 MG tablet Take 1 tablet (40 mg total) by mouth daily.  30 tablet  0  . rosuvastatin (CRESTOR) 20 MG tablet Take 1 tablet (20 mg total) by  mouth at bedtime.  30 tablet  12  . sitaGLIPtan-metformin (JANUMET) 50-1000 MG per tablet Take 1 tablet by mouth 2 (two) times daily with a meal.  60 tablet  0  . warfarin (COUMADIN) 2.5 MG tablet Take 1 tablet (2.5 mg total) by mouth daily. Take 1 tablet daily except 2 tablets on Mondays and Thursdays  45 tablet  3  . DISCONTD: lisinopril (PRINIVIL,ZESTRIL) 10 MG tablet Take 1 tablet (10 mg total) by mouth daily.  30 tablet  3     Past Medical History  Diagnosis Date  . Persistent atrial fibrillation     onset in 2009; cardiac cath in 2001-50% circumflex; recurred 04/2010, 03/2011; ventricular rate controlled; normal coronary angiography in 2001; tachycardia mediated cardiomyopathy;  Tikosyn initiated 05/2011  . Hypertension     with hypertensive heart disease  . Hyperlipidemia     04/2008:220, 289, 43, 119  . Atrial flutter     radiofrequency ablation in 2002  . Obesity   . Chronic anticoagulation   . Tobacco abuse   . Chronic obstructive pulmonary disease 04/20/2011  . GERD (gastroesophageal reflux disease)   . Arthritis   . Diabetes mellitus, type 2     No insulin    ROS:   All systems reviewed  and negative except as noted in the HPI.   Past Surgical History  Procedure Date  . Atrial ablation surgery 2002    atrial flutter; subsequently developed atrial fibrillation  . Implant-ilr     Subsequently removed  . Cardioversion 05/31/2011    Procedure: CARDIOVERSION;  Surgeon: [name]. [name], MD;  Location: [facility];  Service: Cardiovascular;  Laterality: N/A;     Family History  Problem Relation Age of Onset  . Alzheimer's disease Mother   . Osteoporosis Mother      History   Social History  . Marital Status: Single    Spouse Name: N/A    Number of Children: 0  . Years of Education: N/A   Occupational History  . Unemployed    Social History Main Topics  . Smoking status: Current Some Day Smoker -- 0.5 packs/day for 42 years    Types: Cigarettes    Last  Attempt to Quit: 04/18/2011  . Smokeless tobacco: Never Used   Comment: he is trying to quit  . Alcohol Use: No  . Drug Use: No  . Sexually Active: Yes   Other Topics Concern  . 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     BP 120/70  Pulse 68  Ht 6' (1.829 m)  Wt 233 lb 4 oz (105.802 kg)  BMI 31.63 kg/m2  Physical Exam:  Well appearing middle-aged man, NAD HEENT: Unremarkable Neck:  No JVD, no thyromegally Lungs:  Clear with no wheezes, rales, or rhonchi. HEART:  Regular rate rhythm, no murmurs, no rubs, no clicks Abd:  soft, positive bowel sounds, no organomegally, no rebound, no guarding Ext:  2 plus pulses, no edema, no cyanosis, no clubbing Skin:  No rashes no nodules Neuro:  CN II through XII intact, motor grossly intact  EKG Normal sinus rhythm normal axis and intervals  Assess/Plan:

## 2011-12-19 NOTE — Assessment & Plan Note (Signed)
He is currently maintaining sinus rhythm. His ECG demonstrates sinus rhythm and his QT interval is mildly prolonged. She will continue 200 mg a day of amiodarone.

## 2011-12-19 NOTE — Assessment & Plan Note (Signed)
The etiology of his symptoms is unclear. He has a history of reflux disease. The patient's chest pressure however is improved with nitroglycerin. It is variably associated with exertion. He has multiple cardiac risk factors. The patient has undergone regular stress test in the past and has been unable to walk due to arthritis. I've recommended he undergo Lexi scan Myoview. Additional testing may be recommended based on the results of the stress test.

## 2011-12-19 NOTE — Patient Instructions (Signed)
Your physician has requested that you have a lexiscan myoview. For further information please visit [email or web address]. Please follow instruction sheet, as given.   

## 2011-12-20 ENCOUNTER — Ambulatory Visit: Payer: Self-pay | Admitting: Internal Medicine

## 2011-12-22 ENCOUNTER — Ambulatory Visit (INDEPENDENT_AMBULATORY_CARE_PROVIDER_SITE_OTHER): Payer: Medicaid Other

## 2011-12-22 ENCOUNTER — Encounter (HOSPITAL_COMMUNITY)
Admission: RE | Admit: 2011-12-22 | Discharge: 2011-12-22 | Disposition: A | Discharge status: Home / Self Care | Payer: Medicaid Other | Source: Ambulatory Visit | Attending: Internal Medicine | Admitting: Internal Medicine

## 2011-12-22 ENCOUNTER — Encounter (HOSPITAL_COMMUNITY): Payer: Self-pay

## 2011-12-22 DIAGNOSIS — I4891 Unspecified atrial fibrillation: Secondary | ICD-10-CM | POA: Insufficient documentation

## 2011-12-22 DIAGNOSIS — R079 Chest pain, unspecified: Secondary | ICD-10-CM

## 2011-12-22 DIAGNOSIS — I4892 Unspecified atrial flutter: Secondary | ICD-10-CM

## 2011-12-22 MED ORDER — TECHNETIUM TC 99M TETROFOSMIN IV KIT
10.0000 | PACK | Freq: Once | INTRAVENOUS | Status: AC | PRN
  Administered 2011-12-22: 10.2 via INTRAVENOUS

## 2011-12-22 MED ORDER — TECHNETIUM TC 99M TETROFOSMIN IV KIT
30.0000 | PACK | Freq: Once | INTRAVENOUS | Status: AC | PRN
  Administered 2011-12-22: 30 via INTRAVENOUS

## 2011-12-22 NOTE — Progress Notes (Signed)
Stress Lab Nurses Notes - [facility]  CREW GOREN 12/22/2011 Reason for doing test: Arrhythmia/Palpitations and Chest Pain Type of test: Steffanie Dunn Nurse performing test: Parke Poisson, RN Nuclear Medicine Tech: Lyndel Pleasure Echo Tech: Not Applicable MD performing test: R. Rothbart Family MD: Allred Test explained and consent signed: yes IV started: 22g jelco, Saline lock flushed, No redness or edema and Saline lock started in radiology Symptoms: Nausea &  Treatment/Intervention: None Reason test stopped: protocol completed After recovery IV was: Discontinued via X-ray tech and No redness or edema Patient to return to Nuc. Med at : 11:45 Patient discharged: Home Patient's Condition upon discharge was: stable Comments: During test BP 110/60 & HR 98.  Recovery BP 120/70 & HR 86.  Symptoms resovled in recovery.                                                    Erskine Speed T

## 2011-12-25 ENCOUNTER — Other Ambulatory Visit: Payer: Self-pay | Admitting: Cardiology

## 2011-12-25 ENCOUNTER — Ambulatory Visit (INDEPENDENT_AMBULATORY_CARE_PROVIDER_SITE_OTHER): Payer: Medicaid Other | Admitting: *Deleted

## 2011-12-25 DIAGNOSIS — I4892 Unspecified atrial flutter: Secondary | ICD-10-CM

## 2011-12-25 DIAGNOSIS — I4891 Unspecified atrial fibrillation: Secondary | ICD-10-CM

## 2011-12-25 DIAGNOSIS — I4819 Other persistent atrial fibrillation: Secondary | ICD-10-CM

## 2011-12-25 DIAGNOSIS — Z7901 Long term (current) use of anticoagulants: Secondary | ICD-10-CM

## 2011-12-25 LAB — POCT INR: INR: 5.6

## 2011-12-26 ENCOUNTER — Encounter: Payer: Self-pay | Admitting: Cardiology

## 2011-12-26 DIAGNOSIS — N182 Chronic kidney disease, stage 2 (mild): Secondary | ICD-10-CM | POA: Insufficient documentation

## 2011-12-26 LAB — COMPREHENSIVE METABOLIC PANEL
ALT: 34 U/L (ref 0–53)
AST: 27 U/L (ref 0–37)
Albumin: 4.3 g/dL (ref 3.5–5.2)
Alkaline Phosphatase: 54 U/L (ref 39–117)
BUN: 23 mg/dL (ref 6–23)
CO2: 28 mEq/L (ref 19–32)
Calcium: 9.4 mg/dL (ref 8.4–10.5)
Chloride: 102 mEq/L (ref 96–112)
Creat: 1.37 mg/dL — ABNORMAL HIGH (ref 0.50–1.35)
Glucose, Bld: 133 mg/dL — ABNORMAL HIGH (ref 70–99)
Potassium: 4 mEq/L (ref 3.5–5.3)
Sodium: 140 mEq/L (ref 135–145)
Total Bilirubin: 0.3 mg/dL (ref 0.3–1.2)
Total Protein: 6.7 g/dL (ref 6.0–8.3)

## 2011-12-26 LAB — TSH: TSH: 2.692 u[IU]/mL (ref 0.350–4.500)

## 2012-01-01 ENCOUNTER — Ambulatory Visit (INDEPENDENT_AMBULATORY_CARE_PROVIDER_SITE_OTHER): Payer: Self-pay | Admitting: *Deleted

## 2012-01-01 ENCOUNTER — Ambulatory Visit (INDEPENDENT_AMBULATORY_CARE_PROVIDER_SITE_OTHER): Payer: Self-pay | Admitting: Internal Medicine

## 2012-01-01 ENCOUNTER — Encounter: Payer: Self-pay | Admitting: Internal Medicine

## 2012-01-01 ENCOUNTER — Ambulatory Visit (HOSPITAL_COMMUNITY)
Admission: RE | Admit: 2012-01-01 | Discharge: 2012-01-01 | Disposition: A | Discharge status: Home / Self Care | Payer: Self-pay | Source: Ambulatory Visit | Attending: Cardiology | Admitting: Cardiology

## 2012-01-01 VITALS — BP 130/90 | HR 70 | Wt 234.0 lb

## 2012-01-01 DIAGNOSIS — I10 Essential (primary) hypertension: Secondary | ICD-10-CM

## 2012-01-01 DIAGNOSIS — I4891 Unspecified atrial fibrillation: Secondary | ICD-10-CM

## 2012-01-01 DIAGNOSIS — I4892 Unspecified atrial flutter: Secondary | ICD-10-CM

## 2012-01-01 DIAGNOSIS — I4819 Other persistent atrial fibrillation: Secondary | ICD-10-CM

## 2012-01-01 DIAGNOSIS — R0789 Other chest pain: Secondary | ICD-10-CM

## 2012-01-01 DIAGNOSIS — Z7901 Long term (current) use of anticoagulants: Secondary | ICD-10-CM

## 2012-01-01 LAB — POCT INR: INR: 2.8

## 2012-01-01 NOTE — Assessment & Plan Note (Signed)
His symptoms have essentially resolved. No evidence of ischemia on stress testing. I've encouraged the patient to increase his physical activity.

## 2012-01-01 NOTE — Patient Instructions (Addendum)
Your physician wants you to follow-up in: 6 months You will receive a reminder letter in the mail two months in advance. If you don't receive a letter, please call our office to schedule the follow-up appointment.    Continue same meds

## 2012-01-01 NOTE — Progress Notes (Signed)
HPI [name] returns today for followup. He is a very pleasant 61 year old man with a history of persistent atrial fibrillation and coronary disease, who had been experiencing substernal chest pain. I saw the patient several weeks ago. He was unclear whether he was having atrial fibrillation with a rapid ventricular response causing his pain, whether the pain was atypical, or whether it was due to angina. Subsequent stress testing demonstrated preserved left ventricular function and normal perfusion. There is no ischemia. He has had minimal additional symptoms. He notes shortness of breath with exertion. He is quite sedentary. He has minimal peripheral edema. No Known Allergies   Current Outpatient Prescriptions  Medication Sig Dispense Refill  . amiodarone (PACERONE) 200 MG tablet Take 200 mg twice a day for 2 weeks then once a day thereafter  45 tablet  3  . celecoxib (CELEBREX) 200 MG capsule Take 1 capsule (200 mg total) by mouth daily.  30 capsule  0  . chlorthalidone (HYGROTON) 25 MG tablet Take 1 tablet (25 mg total) by mouth daily.  30 tablet  11  . citalopram (CELEXA) 20 MG tablet Take 20 mg by mouth daily.      Marland Kitchen diltiazem (TIAZAC) 360 MG 24 hr capsule Take 1 capsule (360 mg total) by mouth daily.  90 capsule  3  . fish oil-omega-3 fatty acids 1000 MG capsule Take 1 g by mouth daily.        . Fluticasone-Salmeterol (ADVAIR) 250-50 MCG/DOSE AEPB Inhale 2 puffs into the lungs 2 (two) times daily. With spacer  60 each  0  . furosemide (LASIX) 20 MG tablet Take 20 mg by mouth as needed.      . metoprolol succinate (TOPROL-XL) 50 MG 24 hr tablet Take 75 mg (1 2/2 tablets)  45 tablet  12  . nitroGLYCERIN (NITROSTAT) 0.4 MG SL tablet Place 1 tablet (0.4 mg total) under the tongue every 5 (five) minutes x 3 doses as needed for chest pain.  25 tablet  3  . pantoprazole (PROTONIX) 40 MG tablet Take 1 tablet (40 mg total) by mouth daily.  30 tablet  0  . rosuvastatin (CRESTOR) 20 MG tablet Take 1  tablet (20 mg total) by mouth at bedtime.  30 tablet  12  . sitaGLIPtan-metformin (JANUMET) 50-1000 MG per tablet Take 1 tablet by mouth 2 (two) times daily with a meal.  60 tablet  0  . warfarin (COUMADIN) 2.5 MG tablet Take 1 tablet (2.5 mg total) by mouth daily. Take 1 tablet daily except 2 tablets on Mondays and Thursdays  45 tablet  3  . DISCONTD: lisinopril (PRINIVIL,ZESTRIL) 10 MG tablet Take 1 tablet (10 mg total) by mouth daily.  30 tablet  3     Past Medical History  Diagnosis Date  . Persistent atrial fibrillation     onset in 2009; cardiac cath in 2001-50% circumflex; recurred 04/2010, 03/2011; ventricular rate controlled; normal coronary angiography in 2001; tachycardia mediated cardiomyopathy;  Tikosyn initiated 05/2011  . Hypertension     with hypertensive heart disease  . Hyperlipidemia     04/2008:220, 289, 43, 119  . Atrial flutter     radiofrequency ablation in 2002  . Obesity   . Chronic anticoagulation   . Tobacco abuse   . Chronic obstructive pulmonary disease 04/20/2011  . GERD (gastroesophageal reflux disease)   . Arthritis   . Diabetes mellitus, type 2     No insulin    ROS:   All systems reviewed and negative  except as noted in the HPI.   Past Surgical History  Procedure Date  . Atrial ablation surgery 2002    atrial flutter; subsequently developed atrial fibrillation  . Implant-ilr     Subsequently removed  . Cardioversion 05/31/2011    Procedure: CARDIOVERSION;  Surgeon: [name]. [name], MD;  Location: [facility];  Service: Cardiovascular;  Laterality: N/A;     Family History  Problem Relation Age of Onset  . Alzheimer's disease Mother   . Osteoporosis Mother      History   Social History  . Marital Status: Single    Spouse Name: N/A    Number of Children: 0  . Years of Education: N/A   Occupational History  . Unemployed    Social History Main Topics  . Smoking status: Current Some Day Smoker -- 0.5 packs/day for 42 years    Types:  Cigarettes    Last Attempt to Quit: 04/18/2011  . Smokeless tobacco: Never Used   Comment: he is trying to quit  . Alcohol Use: No  . Drug Use: No  . Sexually Active: Yes   Other Topics Concern  . 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     BP 130/90  Pulse 70  Wt 234 lb (106.142 kg)  SpO2 92%  Physical Exam:  Well appearing middle-aged man, NAD HEENT: Unremarkable Neck:  No JVD, no thyromegally Lungs:  Clear with minimal basilar rales but no wheezes or rhonchi. HEART:  Regular rate rhythm, no murmurs, no rubs, no clicks Abd:  soft, positive bowel sounds, no organomegally, no rebound, no guarding Ext:  2 plus pulses, no edema, no cyanosis, no clubbing Skin:  No rashes no nodules Neuro:  CN II through XII intact, motor grossly intact  EKG Normal sinus rhythm  Assess/Plan:

## 2012-01-01 NOTE — Assessment & Plan Note (Signed)
His blood pressure is well controlled. He'll maintain a low-sodium diet. No change in medical therapy.

## 2012-01-01 NOTE — Progress Notes (Signed)
ABPM in progress 24 hrs. 

## 2012-01-01 NOTE — Assessment & Plan Note (Signed)
He appears to be maintaining sinus rhythm very nicely with amiodarone therapy. He'll continue 200 mg daily.

## 2012-01-02 ENCOUNTER — Other Ambulatory Visit: Payer: Self-pay | Admitting: Cardiology

## 2012-01-16 ENCOUNTER — Other Ambulatory Visit: Payer: Self-pay | Admitting: Cardiology

## 2012-01-16 ENCOUNTER — Other Ambulatory Visit: Payer: Self-pay | Admitting: *Deleted

## 2012-01-16 DIAGNOSIS — I10 Essential (primary) hypertension: Secondary | ICD-10-CM

## 2012-01-25 ENCOUNTER — Ambulatory Visit (INDEPENDENT_AMBULATORY_CARE_PROVIDER_SITE_OTHER): Payer: Self-pay | Admitting: *Deleted

## 2012-01-25 DIAGNOSIS — Z7901 Long term (current) use of anticoagulants: Secondary | ICD-10-CM

## 2012-01-25 DIAGNOSIS — I4891 Unspecified atrial fibrillation: Secondary | ICD-10-CM

## 2012-01-25 DIAGNOSIS — I4892 Unspecified atrial flutter: Secondary | ICD-10-CM

## 2012-01-25 DIAGNOSIS — I4819 Other persistent atrial fibrillation: Secondary | ICD-10-CM

## 2012-01-25 LAB — POCT INR: INR: 2.3

## 2012-03-04 ENCOUNTER — Ambulatory Visit (INDEPENDENT_AMBULATORY_CARE_PROVIDER_SITE_OTHER): Payer: Self-pay | Admitting: *Deleted

## 2012-03-04 DIAGNOSIS — Z7901 Long term (current) use of anticoagulants: Secondary | ICD-10-CM

## 2012-03-04 DIAGNOSIS — I4891 Unspecified atrial fibrillation: Secondary | ICD-10-CM

## 2012-03-04 DIAGNOSIS — I4892 Unspecified atrial flutter: Secondary | ICD-10-CM

## 2012-03-04 DIAGNOSIS — I4819 Other persistent atrial fibrillation: Secondary | ICD-10-CM

## 2012-03-04 LAB — POCT INR: INR: 2

## 2012-03-05 ENCOUNTER — Emergency Department (HOSPITAL_COMMUNITY): Payer: Self-pay

## 2012-03-05 ENCOUNTER — Encounter (HOSPITAL_COMMUNITY): Payer: Self-pay | Admitting: *Deleted

## 2012-03-05 ENCOUNTER — Emergency Department (HOSPITAL_COMMUNITY)
Admission: EM | Admit: 2012-03-05 | Discharge: 2012-03-05 | Disposition: A | Discharge status: Home / Self Care | Payer: Self-pay | Attending: Emergency Medicine | Admitting: Emergency Medicine

## 2012-03-05 ENCOUNTER — Other Ambulatory Visit: Payer: Self-pay | Admitting: *Deleted

## 2012-03-05 DIAGNOSIS — J449 Chronic obstructive pulmonary disease, unspecified: Secondary | ICD-10-CM | POA: Insufficient documentation

## 2012-03-05 DIAGNOSIS — Z8679 Personal history of other diseases of the circulatory system: Secondary | ICD-10-CM | POA: Insufficient documentation

## 2012-03-05 DIAGNOSIS — E669 Obesity, unspecified: Secondary | ICD-10-CM | POA: Insufficient documentation

## 2012-03-05 DIAGNOSIS — Z79899 Other long term (current) drug therapy: Secondary | ICD-10-CM | POA: Insufficient documentation

## 2012-03-05 DIAGNOSIS — K219 Gastro-esophageal reflux disease without esophagitis: Secondary | ICD-10-CM | POA: Insufficient documentation

## 2012-03-05 DIAGNOSIS — Z7901 Long term (current) use of anticoagulants: Secondary | ICD-10-CM | POA: Insufficient documentation

## 2012-03-05 DIAGNOSIS — I119 Hypertensive heart disease without heart failure: Secondary | ICD-10-CM | POA: Insufficient documentation

## 2012-03-05 DIAGNOSIS — R42 Dizziness and giddiness: Secondary | ICD-10-CM

## 2012-03-05 DIAGNOSIS — R059 Cough, unspecified: Secondary | ICD-10-CM | POA: Insufficient documentation

## 2012-03-05 DIAGNOSIS — E785 Hyperlipidemia, unspecified: Secondary | ICD-10-CM | POA: Insufficient documentation

## 2012-03-05 DIAGNOSIS — J320 Chronic maxillary sinusitis: Secondary | ICD-10-CM | POA: Insufficient documentation

## 2012-03-05 DIAGNOSIS — J3489 Other specified disorders of nose and nasal sinuses: Secondary | ICD-10-CM | POA: Insufficient documentation

## 2012-03-05 DIAGNOSIS — M129 Arthropathy, unspecified: Secondary | ICD-10-CM | POA: Insufficient documentation

## 2012-03-05 DIAGNOSIS — E119 Type 2 diabetes mellitus without complications: Secondary | ICD-10-CM | POA: Insufficient documentation

## 2012-03-05 DIAGNOSIS — J4489 Other specified chronic obstructive pulmonary disease: Secondary | ICD-10-CM | POA: Insufficient documentation

## 2012-03-05 DIAGNOSIS — Z87891 Personal history of nicotine dependence: Secondary | ICD-10-CM | POA: Insufficient documentation

## 2012-03-05 DIAGNOSIS — R05 Cough: Secondary | ICD-10-CM | POA: Insufficient documentation

## 2012-03-05 MED ORDER — AMOXICILLIN 500 MG PO CAPS: 500.0000 mg | ORAL_CAPSULE | Freq: Three times a day (TID) | ORAL | Status: DC

## 2012-03-05 MED ORDER — AMIODARONE HCL 200 MG PO TABS: 200.0000 mg | ORAL_TABLET | Freq: Every day | ORAL | Status: DC

## 2012-03-05 MED ORDER — LORAZEPAM 1 MG PO TABS: 1.0000 mg | ORAL_TABLET | Freq: Three times a day (TID) | ORAL | Status: DC | PRN

## 2012-03-05 MED ORDER — LORAZEPAM 1 MG PO TABS
1.0000 mg | ORAL_TABLET | Freq: Once | ORAL | Status: AC
  Administered 2012-03-05: 1 mg via ORAL
  Filled 2012-03-05: qty 1

## 2012-03-05 MED ORDER — MECLIZINE HCL 12.5 MG PO TABS
25.0000 mg | ORAL_TABLET | Freq: Once | ORAL | Status: AC
  Administered 2012-03-05: 25 mg via ORAL
  Filled 2012-03-05: qty 2

## 2012-03-05 MED ORDER — MECLIZINE HCL 25 MG PO TABS: 25.0000 mg | ORAL_TABLET | Freq: Four times a day (QID) | ORAL | Status: DC

## 2012-03-05 NOTE — ED Notes (Signed)
Dizzy, sob, nasal congestion for 1 week, Feels "weak in the legs"

## 2012-03-05 NOTE — ED Provider Notes (Addendum)
History  This chart was scribed for Donnetta Hutching, MD by Manuela Schwartz, ED scribe. This patient was seen in room APA08/[facility]08 and the patient's care was started at 1123.   CSN: [id number]  Arrival date & time 03/05/12  1123   First MD Initiated Contact with Patient 03/05/12 1137      Chief Complaint  Patient presents with  . Dizziness   Patient is a 61 y.o. male presenting with cough. The history is provided by the patient. No language interpreter was used.  Cough This is a new problem. The current episode started more than 1 week ago. The problem occurs constantly. The problem has been gradually worsening. The cough is non-productive. There has been no fever. Pertinent negatives include no chest pain, no chills, no ear congestion, no headaches and no shortness of breath. He has tried nothing for the symptoms. He is not a smoker.   [name] Cook is a 61 y.o. male who presents to the Emergency Department complaining of intermittent dizziness and constant gradually worsening sinus congestion for the past 2 weeks. He states his symptoms worsened 2 days ago with the dizziness and he denies any similar previous episodes. He has not taken any medicines for this problem and has not seen anyone for this problem. He states sinus congestion but denies subjective ear fullness or ear pain. He is a former smoker who quit 2.5 months ago.   Cardiologist Dr. [name]  Past Medical History  Diagnosis Date  . Persistent atrial fibrillation     onset in 2009; cardiac cath in 2001-50% circumflex; recurred 04/2010, 03/2011; ventricular rate controlled; normal coronary angiography in 2001; tachycardia mediated cardiomyopathy;  Tikosyn initiated 05/2011  . Hypertension     with hypertensive heart disease  . Hyperlipidemia     04/2008:220, 289, 43, 119  . Atrial flutter     radiofrequency ablation in 2002  . Obesity   . Chronic anticoagulation   . Tobacco abuse   . Chronic obstructive pulmonary disease 04/20/2011  .  GERD (gastroesophageal reflux disease)   . Arthritis   . Diabetes mellitus, type 2     No insulin    Past Surgical History  Procedure Date  . Atrial ablation surgery 2002    atrial flutter; subsequently developed atrial fibrillation  . Implant-ilr     Subsequently removed  . Cardioversion 05/31/2011    Procedure: CARDIOVERSION;  Surgeon: [name]. [name], MD;  Location: [facility];  Service: Cardiovascular;  Laterality: N/A;    Family History  Problem Relation Age of Onset  . Alzheimer's disease Mother   . Osteoporosis Mother     History  Substance Use Topics  . Smoking status: Former Smoker -- 0.5 packs/day for 42 years    Types: Cigarettes    Quit date: 12/31/2011  . Smokeless tobacco: Never Used     Comment: he is trying to quit  . Alcohol Use: No      Review of Systems  Constitutional: Negative for fever and chills.  HENT: Positive for congestion.   Respiratory: Positive for cough. Negative for shortness of breath.   Cardiovascular: Negative for chest pain.  Gastrointestinal: Negative for nausea and vomiting.  Neurological: Positive for dizziness. Negative for weakness and headaches.  All other systems reviewed and are negative.    Allergies  Review of patient's allergies indicates no known allergies.  Home Medications   Current Outpatient Rx  Name  Route  Sig  Dispense  Refill  . AMIODARONE HCL 200 MG  PO TABS   Oral   Take 1 tablet (200 mg total) by mouth daily.   30 tablet   6   . CELECOXIB 200 MG PO CAPS   Oral   Take 1 capsule (200 mg total) by mouth daily.   30 capsule   0     PCP to provide further refills   . CHLORTHALIDONE 25 MG PO TABS   Oral   Take 1 tablet (25 mg total) by mouth daily.   30 tablet   11   . CITALOPRAM HYDROBROMIDE 20 MG PO TABS   Oral   Take 20 mg by mouth daily.         Marland Kitchen COUMADIN 2.5 MG PO TABS      TAKE 1 TABLET BY MOUTH ONCE DAILY EXCEPT 2 TABLETS ON MONDAYS AND THURSDAYS.   45 tablet   2   . DILTIAZEM  HCL ER BEADS 360 MG PO CP24   Oral   Take 1 capsule (360 mg total) by mouth daily.   90 capsule   3   . OMEGA-3 FATTY ACIDS 1000 MG PO CAPS   Oral   Take 1 g by mouth daily.           Marland Kitchen FLUTICASONE-SALMETEROL 250-50 MCG/DOSE IN AEPB   Inhalation   Inhale 2 puffs into the lungs 2 (two) times daily. With spacer   60 each   0     PCP to provide further refills   . FUROSEMIDE 20 MG PO TABS   Oral   Take 20 mg by mouth as needed.         Marland Kitchen METOPROLOL SUCCINATE ER 50 MG PO TB24      Take 75 mg (1 2/2 tablets)   45 tablet   12   . NITROGLYCERIN 0.4 MG SL SUBL   Sublingual   Place 1 tablet (0.4 mg total) under the tongue every 5 (five) minutes x 3 doses as needed for chest pain.   25 tablet   3   . PANTOPRAZOLE SODIUM 40 MG PO TBEC   Oral   Take 1 tablet (40 mg total) by mouth daily.   30 tablet   0     PCP to provide further refills   . ROSUVASTATIN CALCIUM 20 MG PO TABS   Oral   Take 1 tablet (20 mg total) by mouth at bedtime.   30 tablet   12   . SITAGLIPTIN-METFORMIN HCL 50-1000 MG PO TABS   Oral   Take 1 tablet by mouth 2 (two) times daily with a meal.   60 tablet   0     PCP to provide authorization for further refills     Triage vitals: BP 130/78  Pulse 63  Temp 97.5 F (36.4 C) (Oral)  Ht 6' (1.829 m)  Wt 240 lb (108.863 kg)  BMI 32.55 kg/m2  SpO2 100%  Physical Exam  Nursing note and vitals reviewed. Constitutional: He is oriented to person, place, and time. He appears well-developed and well-nourished.  HENT:  Head: Normocephalic and atraumatic.  Right Ear: External ear normal.  Left Ear: External ear normal.       Maxillary sinus tenderness  Eyes: Conjunctivae normal and EOM are normal. Pupils are equal, round, and reactive to light.  Neck: Normal range of motion. Neck supple.  Cardiovascular: Normal rate, regular rhythm and normal heart sounds.   Pulmonary/Chest: Effort normal. He has wheezes.  Abdominal: Soft. Bowel sounds are  normal.  Musculoskeletal: Normal range of motion.  Neurological: He is alert and oriented to person, place, and time.  Skin: Skin is warm and dry.  Psychiatric: He has a normal mood and affect.    ED Course  Procedures (including critical care time) DIAGNOSTIC STUDIES: Oxygen Saturation is 100% on room air, normal by my interpretation.    COORDINATION OF CARE: At 1200 PM Discussed treatment plan with patient which includes antivert, ativan, head CT, EKG. Patient agrees.   Labs Reviewed - No data to display No results found. Ct Head Wo Contrast  03/05/2012  *RADIOLOGY REPORT*  Clinical Data: Dizzy.  Weakness.  CT HEAD WITHOUT CONTRAST  Technique:  Contiguous axial images were obtained from the base of the skull through the vertex without contrast.  Comparison: None.  Findings: The ventricles are normal in configuration.  There is ventricular and sulcal enlargement greater than expected for a patient of this age consistent with mild diffuse atrophy.  There are no parenchymal masses or mass effect.  There are no areas of abnormal parenchymal attenuation.  There is no evidence of a recent infarct.  No extra-axial masses or abnormal fluid collections.  There is no intracranial hemorrhage.  The visualized sinuses, mastoid air cells and middle ear cavities are clear.  IMPRESSION: No acute intracranial abnormalities.  Mild atrophy.  No other abnormalities.   Original Report Authenticated By: Amie Portland, M.D.     No diagnosis found.   Date: 03/05/2012  Rate: 61  Rhythm: normal sinus rhythm  QRS Axis: normal  Intervals: normal  ST/T Wave abnormalities: normal  Conduction Disutrbances: none  Narrative Interpretation: unremarkable     MDM  CT head negative. Physical exam shows no neuro deficits. Discharged home on Ativan 1 mg #15 and Antivert 25 mg #20.  Also Rx amoxicillin 500 mg 3 times a day #30 for sinusitis.      I personally performed the services described in this  documentation, which was scribed in my presence. The recorded information has been reviewed and is accurate.          Donnetta Hutching, MD 03/05/12 1436  Donnetta Hutching, MD 03/05/12 [phone]

## 2012-04-01 ENCOUNTER — Ambulatory Visit (INDEPENDENT_AMBULATORY_CARE_PROVIDER_SITE_OTHER): Payer: Self-pay | Admitting: *Deleted

## 2012-04-01 DIAGNOSIS — I4819 Other persistent atrial fibrillation: Secondary | ICD-10-CM

## 2012-04-01 DIAGNOSIS — I4891 Unspecified atrial fibrillation: Secondary | ICD-10-CM

## 2012-04-01 DIAGNOSIS — I4892 Unspecified atrial flutter: Secondary | ICD-10-CM

## 2012-04-01 DIAGNOSIS — Z7901 Long term (current) use of anticoagulants: Secondary | ICD-10-CM

## 2012-04-01 LAB — POCT INR: INR: 2.6

## 2012-04-30 ENCOUNTER — Other Ambulatory Visit: Payer: Self-pay | Admitting: Cardiology

## 2012-04-30 LAB — COMPREHENSIVE METABOLIC PANEL
ALT: 72 U/L — ABNORMAL HIGH (ref 0–53)
AST: 53 U/L — ABNORMAL HIGH (ref 0–37)
Albumin: 4.2 g/dL (ref 3.5–5.2)
Alkaline Phosphatase: 71 U/L (ref 39–117)
BUN: 27 mg/dL — ABNORMAL HIGH (ref 6–23)
CO2: 26 mEq/L (ref 19–32)
Calcium: 9.3 mg/dL (ref 8.4–10.5)
Chloride: 99 mEq/L (ref 96–112)
Creat: 1.2 mg/dL (ref 0.50–1.35)
Glucose, Bld: 269 mg/dL — ABNORMAL HIGH (ref 70–99)
Potassium: 4.4 mEq/L (ref 3.5–5.3)
Sodium: 135 mEq/L (ref 135–145)
Total Bilirubin: 0.4 mg/dL (ref 0.3–1.2)
Total Protein: 6.3 g/dL (ref 6.0–8.3)

## 2012-04-30 LAB — TSH: TSH: 2.956 u[IU]/mL (ref 0.350–4.500)

## 2012-05-02 ENCOUNTER — Encounter: Payer: Self-pay | Admitting: Cardiology

## 2012-05-02 DIAGNOSIS — R7989 Other specified abnormal findings of blood chemistry: Secondary | ICD-10-CM | POA: Insufficient documentation

## 2012-05-02 DIAGNOSIS — R945 Abnormal results of liver function studies: Secondary | ICD-10-CM | POA: Insufficient documentation

## 2012-05-07 ENCOUNTER — Other Ambulatory Visit: Payer: Self-pay | Admitting: *Deleted

## 2012-05-07 DIAGNOSIS — R945 Abnormal results of liver function studies: Secondary | ICD-10-CM

## 2012-05-09 ENCOUNTER — Other Ambulatory Visit (HOSPITAL_COMMUNITY): Payer: Self-pay

## 2012-05-10 ENCOUNTER — Ambulatory Visit (HOSPITAL_COMMUNITY): Payer: Medicaid Other

## 2012-05-10 ENCOUNTER — Ambulatory Visit (HOSPITAL_COMMUNITY)
Admission: RE | Admit: 2012-05-10 | Discharge: 2012-05-10 | Disposition: A | Discharge status: Home / Self Care | Payer: No Typology Code available for payment source | Source: Ambulatory Visit | Attending: Cardiology | Admitting: Cardiology

## 2012-05-10 DIAGNOSIS — R945 Abnormal results of liver function studies: Secondary | ICD-10-CM

## 2012-05-10 DIAGNOSIS — K7689 Other specified diseases of liver: Secondary | ICD-10-CM | POA: Insufficient documentation

## 2012-05-10 DIAGNOSIS — R7989 Other specified abnormal findings of blood chemistry: Secondary | ICD-10-CM | POA: Insufficient documentation

## 2012-05-13 ENCOUNTER — Ambulatory Visit (INDEPENDENT_AMBULATORY_CARE_PROVIDER_SITE_OTHER): Payer: Self-pay | Admitting: *Deleted

## 2012-05-13 ENCOUNTER — Encounter: Payer: Self-pay | Admitting: Cardiology

## 2012-05-13 DIAGNOSIS — I4891 Unspecified atrial fibrillation: Secondary | ICD-10-CM

## 2012-05-13 DIAGNOSIS — Z7901 Long term (current) use of anticoagulants: Secondary | ICD-10-CM

## 2012-05-13 DIAGNOSIS — I4819 Other persistent atrial fibrillation: Secondary | ICD-10-CM

## 2012-05-13 DIAGNOSIS — I4892 Unspecified atrial flutter: Secondary | ICD-10-CM

## 2012-05-13 LAB — POCT INR: INR: 1.4

## 2012-05-29 ENCOUNTER — Ambulatory Visit (INDEPENDENT_AMBULATORY_CARE_PROVIDER_SITE_OTHER): Payer: Medicaid Other | Admitting: *Deleted

## 2012-05-29 DIAGNOSIS — I4891 Unspecified atrial fibrillation: Secondary | ICD-10-CM

## 2012-05-29 DIAGNOSIS — I4819 Other persistent atrial fibrillation: Secondary | ICD-10-CM

## 2012-05-29 DIAGNOSIS — I4892 Unspecified atrial flutter: Secondary | ICD-10-CM

## 2012-05-29 DIAGNOSIS — Z7901 Long term (current) use of anticoagulants: Secondary | ICD-10-CM

## 2012-05-29 LAB — POCT INR: INR: 2.9

## 2012-06-04 ENCOUNTER — Other Ambulatory Visit: Payer: Self-pay | Admitting: Cardiology

## 2012-06-04 MED ORDER — WARFARIN SODIUM 2.5 MG PO TABS: 2.5000 mg | ORAL_TABLET | Freq: Every day | ORAL | Status: DC

## 2012-06-12 ENCOUNTER — Other Ambulatory Visit: Payer: Self-pay | Admitting: *Deleted

## 2012-06-12 DIAGNOSIS — Z5181 Encounter for therapeutic drug level monitoring: Secondary | ICD-10-CM

## 2012-06-12 DIAGNOSIS — Z79899 Other long term (current) drug therapy: Secondary | ICD-10-CM

## 2012-06-17 ENCOUNTER — Encounter: Payer: Self-pay | Admitting: Internal Medicine

## 2012-06-17 ENCOUNTER — Ambulatory Visit (INDEPENDENT_AMBULATORY_CARE_PROVIDER_SITE_OTHER): Payer: Medicaid Other | Admitting: Internal Medicine

## 2012-06-17 VITALS — BP 130/92 | HR 62 | Ht 74.0 in | Wt 232.4 lb

## 2012-06-17 DIAGNOSIS — R0789 Other chest pain: Secondary | ICD-10-CM

## 2012-06-17 DIAGNOSIS — I4819 Other persistent atrial fibrillation: Secondary | ICD-10-CM

## 2012-06-17 DIAGNOSIS — R7989 Other specified abnormal findings of blood chemistry: Secondary | ICD-10-CM

## 2012-06-17 DIAGNOSIS — R945 Abnormal results of liver function studies: Secondary | ICD-10-CM

## 2012-06-17 DIAGNOSIS — I4891 Unspecified atrial fibrillation: Secondary | ICD-10-CM

## 2012-06-17 MED ORDER — NITROGLYCERIN 0.4 MG SL SUBL: 0.4000 mg | SUBLINGUAL_TABLET | SUBLINGUAL | Status: DC | PRN

## 2012-06-17 MED ORDER — METOPROLOL SUCCINATE ER 50 MG PO TB24: 50.0000 mg | ORAL_TABLET | Freq: Two times a day (BID) | ORAL | Status: DC

## 2012-06-17 NOTE — Assessment & Plan Note (Signed)
His ultrasound showed evidence of a fatty liver. I've recommended that the patient stop his amiodarone therapy. He'll need followup liver function tests in 6 months. If his liver function tests have not changed while off of amiodarone, I might consider restarting amiodarone, particularly if he develops recurrent symptomatic atrial fibrillation.

## 2012-06-17 NOTE — Progress Notes (Signed)
HPI [name] Cook returns today for followup. He is a 62 year old man with a history of paroxysmal atrial fibrillation, hypertension, dyslipidemia, obesity, and diabetes. The patient has had good control of his atrial fibrillation on amiodarone. Unfortunately, he has developed an elevation in his liver function test. He denies cough. He is noted chest pain which has increased in frequency and severity in the last week. The pain is sharp. At times it is related to exertion at other times occurs at rest. It is minimally associated with shortness of breath. No nausea, vomiting, or diaphoresis. The patient had a stress test 6 months ago which demonstrated no ischemia. No Known Allergies   Current Outpatient Prescriptions  Medication Sig Dispense Refill  . amiodarone (PACERONE) 200 MG tablet Take 1 tablet (200 mg total) by mouth daily.  30 tablet  6  . amoxicillin (AMOXIL) 500 MG capsule Take 1 capsule (500 mg total) by mouth 3 (three) times daily.  30 capsule  0  . celecoxib (CELEBREX) 200 MG capsule Take 1 capsule (200 mg total) by mouth daily.  30 capsule  0  . chlorthalidone (HYGROTON) 25 MG tablet Take 1 tablet (25 mg total) by mouth daily.  30 tablet  11  . citalopram (CELEXA) 20 MG tablet Take 20 mg by mouth daily.      . fish oil-omega-3 fatty acids 1000 MG capsule Take 1 g by mouth daily.        . Fluticasone-Salmeterol (ADVAIR) 250-50 MCG/DOSE AEPB Inhale 2 puffs into the lungs 2 (two) times daily. With spacer  60 each  0  . furosemide (LASIX) 20 MG tablet Take 20 mg by mouth as needed. Swelling.      Marland Kitchen LORazepam (ATIVAN) 1 MG tablet Take 1 tablet (1 mg total) by mouth 3 (three) times daily as needed for anxiety.  15 tablet  0  . meclizine (ANTIVERT) 25 MG tablet Take 1 tablet (25 mg total) by mouth 4 (four) times daily.  20 tablet  0  . metoprolol succinate (TOPROL-XL) 50 MG 24 hr tablet Take 75 mg by mouth daily.      . nitroGLYCERIN (NITROSTAT) 0.4 MG SL tablet Place 1 tablet (0.4 mg total)  under the tongue every 5 (five) minutes x 3 doses as needed for chest pain.  25 tablet  3  . pantoprazole (PROTONIX) 40 MG tablet Take 1 tablet (40 mg total) by mouth daily.  30 tablet  0  . rosuvastatin (CRESTOR) 20 MG tablet Take 1 tablet (20 mg total) by mouth at bedtime.  30 tablet  12  . sitaGLIPtan-metformin (JANUMET) 50-1000 MG per tablet Take 1 tablet by mouth 2 (two) times daily with a meal.  60 tablet  0  . warfarin (COUMADIN) 2.5 MG tablet Take 1-2 tablets (2.5-5 mg total) by mouth daily. Takes 2 tablets on Fridays. 1 tablet every day.  45 tablet  3  . [DISCONTINUED] lisinopril (PRINIVIL,ZESTRIL) 10 MG tablet Take 1 tablet (10 mg total) by mouth daily.  30 tablet  3   No current facility-administered medications for this visit.     Past Medical History  Diagnosis Date  . Persistent atrial fibrillation     onset in 2009; cardiac cath in 2001-50% circumflex; recurred 04/2010, 03/2011; ventricular rate controlled; normal coronary angiography in 2001; tachycardia mediated cardiomyopathy;  Tikosyn initiated 05/2011  . Hypertension     with hypertensive heart disease  . Hyperlipidemia     04/2008:220, 289, 43, 119  . Atrial flutter  radiofrequency ablation in 2002  . Obesity   . Chronic anticoagulation   . Tobacco abuse   . Chronic obstructive pulmonary disease 04/20/2011  . GERD (gastroesophageal reflux disease)   . Arthritis   . Diabetes mellitus, type 2     No insulin    ROS:   All systems reviewed and negative except as noted in the HPI.   Past Surgical History  Procedure Laterality Date  . Atrial ablation surgery  2002    atrial flutter; subsequently developed atrial fibrillation  . Implant-ilr      Subsequently removed  . Cardioversion  05/31/2011    Procedure: CARDIOVERSION;  Surgeon: [name]. [name], MD;  Location: [facility];  Service: Cardiovascular;  Laterality: N/A;     Family History  Problem Relation Age of Onset  . Alzheimer's disease Mother   .  Osteoporosis Mother      History   Social History  . Marital Status: Legally Separated    Spouse Name: N/A    Number of Children: 0  . Years of Education: N/A   Occupational History  . Unemployed    Social History Main Topics  . Smoking status: Former Smoker -- 0.50 packs/day for 42 years    Types: Cigarettes    Quit date: 12/31/2011  . Smokeless tobacco: Never Used     Comment: he is trying to quit  . Alcohol Use: No  . Drug Use: No  . Sexually Active: Yes   Other Topics Concern  . Not on file   Social History Narrative   Unable to afford medications   Resides with girlfriend      Pt lives in [place]   Disabled (arthritis), previously worked at an [employer] and [employer] treatment center.           BP 130/92  Pulse 62  Ht 6\' 2"  (1.88 m)  Wt 232 lb 6.4 oz (105.416 kg)  BMI 29.83 kg/m2  SpO2 96%  Physical Exam:  Well appearing middle-aged man,NAD HEENT: Unremarkable Neck:  7 cm JVD, no thyromegally Lungs:  Clear with no wheezes, rales, or rhonchi. HEART:  Regular rate rhythm, no murmurs, no rubs, no clicks Abd:  soft, positive bowel sounds, no organomegally, no rebound, no guarding Ext:  2 plus pulses, no edema, no cyanosis, no clubbing Skin:  No rashes no nodules Neuro:  CN II through XII intact, motor grossly intact   Assess/Plan:

## 2012-06-17 NOTE — Patient Instructions (Addendum)
Your physician recommends that you schedule a follow-up appointment in: 6 MONTHS WITH GT  Your physician has recommended you make the following change in your medication:   1) STOP AMIODARONE 2) INCREASE TOPROL 50MG  TWICE DAILY 3) DO NOT RE-START/TAKE CARDIZEM ALSO KNOWN AS DILTAZEM/TIAZAC/CARTIA

## 2012-06-17 NOTE — Assessment & Plan Note (Signed)
The patient has maintaining sinus rhythm very nicely it has developed elevated liver function tests on amiodarone. Because of the abnormalities, I've recommended that he stop taking amiodarone. He will continue his anticoagulation and beta blocker therapy will be up titrated.

## 2012-06-17 NOTE — Assessment & Plan Note (Signed)
His symptoms are atypical for coronary ischemia though ischemia cannot be ruled out. With his negative stress test 6 months ago, my plan is to up titrate his beta blocker, and give the patient a prescription for nitroglycerin. If his symptoms worsen, he is instructed to call us and would be scheduled for left heart catheterization.

## 2012-06-20 ENCOUNTER — Telehealth: Payer: Self-pay | Admitting: *Deleted

## 2012-06-20 NOTE — Telephone Encounter (Signed)
Was advised Blima Singer called office to clarify pt Toprol medication dosage/sig, called number provided [phone], per noted recent OV 06-17-12 with MD GT instructions noted as follows:  Your physician recommends that you schedule a follow-up appointment in: 6 MONTHS WITH GT  Your physician has recommended you make the following change in your medication:  1) STOP AMIODARONE  2) INCREASE TOPROL 50MG  TWICE DAILY  3) DO NOT RE-START/TAKE CARDIZEM ALSO KNOWN AS DILTAZEM/TIAZAC/CARTIA    WAS ADVISED TO FAX A COPY OF PT NEW TOPROL XL PRESCRIPTION TO HER FAX NUMBER AT THE HEALTH DEPT PER PT ON THE PRESCRIPTION ASSISTANCE PROGRAM AND WILL NEED A COPY OF PRESCRIPTION FOR THEIR RECORDS, NOTED THE RX WAS SENT VIA ESCRIBE TO [facility], ADVISED MD GT IS NOT IN THE OFFICE TIL NEXT WEEK, WE WILL HAVE NP KL SIGN RX AND FAX TO NUMBER PROVIDED [phone]

## 2012-06-26 ENCOUNTER — Ambulatory Visit (INDEPENDENT_AMBULATORY_CARE_PROVIDER_SITE_OTHER): Payer: Medicaid Other | Admitting: *Deleted

## 2012-06-26 DIAGNOSIS — I4819 Other persistent atrial fibrillation: Secondary | ICD-10-CM

## 2012-06-26 DIAGNOSIS — Z7901 Long term (current) use of anticoagulants: Secondary | ICD-10-CM

## 2012-06-26 DIAGNOSIS — I4892 Unspecified atrial flutter: Secondary | ICD-10-CM

## 2012-06-26 DIAGNOSIS — I4891 Unspecified atrial fibrillation: Secondary | ICD-10-CM

## 2012-06-26 LAB — POCT INR: INR: 1.6

## 2012-06-27 NOTE — Telephone Encounter (Signed)
Please fill out hand written prescription for pt per sent via escribe and received call they need hand written to process for pt

## 2012-06-27 NOTE — Telephone Encounter (Signed)
Received written RX dor Toprol 50mg  #60 one tab PO BID 6 refills, faxed to number provided

## 2012-07-09 ENCOUNTER — Encounter: Payer: Self-pay | Admitting: *Deleted

## 2012-07-15 ENCOUNTER — Ambulatory Visit (INDEPENDENT_AMBULATORY_CARE_PROVIDER_SITE_OTHER): Payer: Medicaid Other | Admitting: *Deleted

## 2012-07-15 DIAGNOSIS — Z7901 Long term (current) use of anticoagulants: Secondary | ICD-10-CM

## 2012-07-15 DIAGNOSIS — I4891 Unspecified atrial fibrillation: Secondary | ICD-10-CM

## 2012-07-15 DIAGNOSIS — I4819 Other persistent atrial fibrillation: Secondary | ICD-10-CM

## 2012-07-15 DIAGNOSIS — I4892 Unspecified atrial flutter: Secondary | ICD-10-CM

## 2012-07-15 LAB — POCT INR: INR: 3.8

## 2012-07-16 LAB — COMPREHENSIVE METABOLIC PANEL
ALT: 31 U/L (ref 0–53)
AST: 24 U/L (ref 0–37)
Albumin: 3.6 g/dL (ref 3.5–5.2)
Alkaline Phosphatase: 53 U/L (ref 39–117)
BUN: 16 mg/dL (ref 6–23)
CO2: 24 mEq/L (ref 19–32)
Calcium: 8.7 mg/dL (ref 8.4–10.5)
Chloride: 101 mEq/L (ref 96–112)
Creat: 1.16 mg/dL (ref 0.50–1.35)
Glucose, Bld: 94 mg/dL (ref 70–99)
Potassium: 4.2 mEq/L (ref 3.5–5.3)
Sodium: 136 mEq/L (ref 135–145)
Total Bilirubin: 0.4 mg/dL (ref 0.3–1.2)
Total Protein: 5.7 g/dL — ABNORMAL LOW (ref 6.0–8.3)

## 2012-07-16 LAB — TSH: TSH: 2.824 u[IU]/mL (ref 0.350–4.500)

## 2012-07-18 ENCOUNTER — Telehealth: Payer: Self-pay | Admitting: Cardiology

## 2012-07-18 NOTE — Telephone Encounter (Signed)
PT NEEDS TOPROL XL 50MG  CALLED IN TO [facility] [phone].

## 2012-07-18 NOTE — Telephone Encounter (Signed)
Called in # [phone] with 3 refills to pharmacist at [facility], per patient's request.

## 2012-07-19 ENCOUNTER — Encounter: Payer: Self-pay | Admitting: Cardiology

## 2012-08-01 ENCOUNTER — Ambulatory Visit (INDEPENDENT_AMBULATORY_CARE_PROVIDER_SITE_OTHER): Payer: Medicaid Other | Admitting: *Deleted

## 2012-08-01 DIAGNOSIS — I4819 Other persistent atrial fibrillation: Secondary | ICD-10-CM

## 2012-08-01 DIAGNOSIS — I4892 Unspecified atrial flutter: Secondary | ICD-10-CM

## 2012-08-01 DIAGNOSIS — Z7901 Long term (current) use of anticoagulants: Secondary | ICD-10-CM

## 2012-08-01 DIAGNOSIS — I4891 Unspecified atrial fibrillation: Secondary | ICD-10-CM

## 2012-08-01 LAB — POCT INR: INR: 2.1

## 2012-08-20 ENCOUNTER — Ambulatory Visit: Payer: No Typology Code available for payment source | Admitting: Cardiology

## 2012-08-29 ENCOUNTER — Ambulatory Visit (INDEPENDENT_AMBULATORY_CARE_PROVIDER_SITE_OTHER): Payer: Medicare Other | Admitting: *Deleted

## 2012-08-29 ENCOUNTER — Encounter: Payer: Self-pay | Admitting: Cardiology

## 2012-08-29 ENCOUNTER — Ambulatory Visit (INDEPENDENT_AMBULATORY_CARE_PROVIDER_SITE_OTHER): Payer: Commercial Managed Care - HMO | Admitting: Cardiology

## 2012-08-29 VITALS — BP 138/90 | HR 88 | Ht 74.0 in | Wt 235.0 lb

## 2012-08-29 DIAGNOSIS — R945 Abnormal results of liver function studies: Secondary | ICD-10-CM

## 2012-08-29 DIAGNOSIS — I4892 Unspecified atrial flutter: Secondary | ICD-10-CM

## 2012-08-29 DIAGNOSIS — Z7901 Long term (current) use of anticoagulants: Secondary | ICD-10-CM

## 2012-08-29 DIAGNOSIS — I10 Essential (primary) hypertension: Secondary | ICD-10-CM

## 2012-08-29 DIAGNOSIS — I4819 Other persistent atrial fibrillation: Secondary | ICD-10-CM

## 2012-08-29 DIAGNOSIS — R7989 Other specified abnormal findings of blood chemistry: Secondary | ICD-10-CM

## 2012-08-29 DIAGNOSIS — I4891 Unspecified atrial fibrillation: Secondary | ICD-10-CM

## 2012-08-29 DIAGNOSIS — R7401 Elevation of levels of liver transaminase levels: Secondary | ICD-10-CM

## 2012-08-29 LAB — POCT INR: INR: 2.4

## 2012-08-29 NOTE — Progress Notes (Deleted)
Name: [name] Cook    DOB: 03/02/1951  Age: 62 y.o.  MR#: [id number]       PCP:  Provider Not In System      Insurance: Payor: MEDICARE / Plan: MEDICARE PART B / Product Type: *No Product type* /   CC:   No chief complaint on file. LIST NOTED DIZZY, ORTHOSTATICS NOTED, 24HOUR BP REPORT PRINTED  VS Filed Vitals:   08/29/12 1433 08/29/12 1438 08/29/12 1440  BP:  132/88 138/90  Pulse:  72 88  Height: 6\' 2"  (1.88 m)    Weight: 235 lb (106.595 kg)      Weights Current Weight  08/29/12 235 lb (106.595 kg)  06/17/12 232 lb 6.4 oz (105.416 kg)  03/05/12 240 lb (108.863 kg)    Blood Pressure  BP Readings from Last 3 Encounters:  08/29/12 138/90  06/17/12 130/92  03/05/12 130/78     Admit date:  (Not on file) Last encounter with RMR:  08/20/2012   Allergy Review of patient's allergies indicates no known allergies.  Current Outpatient Prescriptions  Medication Sig Dispense Refill  . celecoxib (CELEBREX) 200 MG capsule Take 1 capsule (200 mg total) by mouth daily.  30 capsule  0  . chlorthalidone (HYGROTON) 25 MG tablet Take 1 tablet (25 mg total) by mouth daily.  30 tablet  11  . fish oil-omega-3 fatty acids 1000 MG capsule Take 1 g by mouth daily.        . Fluticasone-Salmeterol (ADVAIR) 250-50 MCG/DOSE AEPB Inhale 2 puffs into the lungs 2 (two) times daily. With spacer  60 each  0  . furosemide (LASIX) 20 MG tablet Take 20 mg by mouth as needed. Swelling.      . metoprolol succinate (TOPROL-XL) 50 MG 24 hr tablet Take 1 tablet (50 mg total) by mouth 2 (two) times daily. Take with or immediately following a meal.  60 tablet  5  . nitroGLYCERIN (NITROSTAT) 0.4 MG SL tablet Place 1 tablet (0.4 mg total) under the tongue every 5 (five) minutes x 3 doses as needed for chest pain.  60 tablet  1  . pantoprazole (PROTONIX) 40 MG tablet Take 1 tablet (40 mg total) by mouth daily.  30 tablet  0  . rosuvastatin (CRESTOR) 20 MG tablet Take 1 tablet (20 mg total) by mouth at bedtime.  30 tablet  12   . sitaGLIPtan-metformin (JANUMET) 50-1000 MG per tablet Take 1 tablet by mouth 2 (two) times daily with a meal.  60 tablet  0  . warfarin (COUMADIN) 2.5 MG tablet Take 1-2 tablets (2.5-5 mg total) by mouth daily. Takes 2 tablets on Fridays. 1 tablet every day.  45 tablet  3  . [DISCONTINUED] lisinopril (PRINIVIL,ZESTRIL) 10 MG tablet Take 1 tablet (10 mg total) by mouth daily.  30 tablet  3   No current facility-administered medications for this visit.    Discontinued Meds:    Medications Discontinued During This Encounter  Medication Reason  . amoxicillin (AMOXIL) 500 MG capsule Completed Course  . citalopram (CELEXA) 20 MG tablet Completed Course  . LORazepam (ATIVAN) 1 MG tablet Completed Course  . meclizine (ANTIVERT) 25 MG tablet Completed Course    Patient Active Problem List   Diagnosis Date Noted  . Abnormal LFTs 05/02/2012  . Chronic kidney disease, 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/26/2011  . Chest pressure 12/19/2011  . Chronic obstructive pulmonary disease 04/20/2011  . Persistent atrial fibrillation   . Hypertension   . Hyperlipidemia   . Diabetes mellitus, type  2   . Atrial flutter   . Tobacco abuse   . Chronic anticoagulation 04/06/2011  . GASTROESOPHAGEAL REFLUX DISEASE 05/16/2010    LABS    Component Value Date/Time   NA 136 07/15/2012 1215   NA 135 04/30/2012 0848   NA 140 12/25/2011 1719   K 4.2 07/15/2012 1215   K 4.4 04/30/2012 0848   K 4.0 12/25/2011 1719   CL 101 07/15/2012 1215   CL 99 04/30/2012 0848   CL 102 12/25/2011 1719   CO2 24 07/15/2012 1215   CO2 26 04/30/2012 0848   CO2 28 12/25/2011 1719   GLUCOSE 94 07/15/2012 1215   GLUCOSE 269* 04/30/2012 0848   GLUCOSE 133* 12/25/2011 1719   BUN 16 07/15/2012 1215   BUN 27* 04/30/2012 0848   BUN 23 12/25/2011 1719   CREATININE 1.16 07/15/2012 1215   CREATININE 1.20 04/30/2012 0848   CREATININE 1.37* 12/25/2011 1719   CREATININE 0.73 06/09/2011 0501   CREATININE 0.82 06/08/2011 2043   CREATININE 0.76 06/07/2011 0511   CALCIUM  8.7 07/15/2012 1215   CALCIUM 9.3 04/30/2012 0848   CALCIUM 9.4 12/25/2011 1719   GFRNONAA >90 06/09/2011 0501   GFRNONAA >90 06/08/2011 2043   GFRNONAA >90 06/07/2011 0511   GFRAA >90 06/09/2011 0501   GFRAA >90 06/08/2011 2043   GFRAA >90 06/07/2011 0511   CMP     Component Value Date/Time   NA 136 07/15/2012 1215   K 4.2 07/15/2012 1215   CL 101 07/15/2012 1215   CO2 24 07/15/2012 1215   GLUCOSE 94 07/15/2012 1215   BUN 16 07/15/2012 1215   CREATININE 1.16 07/15/2012 1215   CREATININE 0.73 06/09/2011 0501   CALCIUM 8.7 07/15/2012 1215   PROT 5.7* 07/15/2012 1215   ALBUMIN 3.6 07/15/2012 1215   AST 24 07/15/2012 1215   ALT 31 07/15/2012 1215   ALKPHOS 53 07/15/2012 1215   BILITOT 0.4 07/15/2012 1215   GFRNONAA >90 06/09/2011 0501   GFRAA >90 06/09/2011 0501       Component Value Date/Time   WBC 7.8 08/24/2011 1150   WBC 7.0 06/08/2011 2043   WBC 5.6 06/04/2011 1903   HGB 14.0 08/24/2011 1150   HGB 13.4 06/08/2011 2043   HGB 14.0 06/04/2011 1903   HCT 42.0 08/24/2011 1150   HCT 38.7* 06/08/2011 2043   HCT 40.9 06/04/2011 1903   MCV 94.6 08/24/2011 1150   MCV 91.1 06/08/2011 2043   MCV 90.9 06/04/2011 1903    Lipid Panel     Component Value Date/Time   CHOL 137 10/18/2011 1609   TRIG 228* 10/18/2011 1609   HDL 45 10/18/2011 1609   CHOLHDL 3.0 10/18/2011 1609   VLDL 46* 10/18/2011 1609   LDLCALC 46 10/18/2011 1609   LDLCALC 119 05/05/2008    ABG No results found for this basename: phart, pco2, pco2art, po2, po2art, hco3, tco2, acidbasedef, o2sat     Lab Results  Component Value Date   TSH 2.824 07/15/2012   BNP (last 3 results) No results found for this basename: PROBNP,  in the last 8760 hours Cardiac Panel (last 3 results) No results found for this basename: CKTOTAL, CKMB, TROPONINI, RELINDX,  in the last 72 hours  Iron/TIBC/Ferritin No results found for this basename: iron, tibc, ferritin     EKG Orders placed in visit on 08/29/12  . EKG 12-LEAD     Prior Assessment and Plan Problem List as  of 08/29/2012   GASTROESOPHAGEAL REFLUX DISEASE   Chronic  anticoagulation   Last Assessment & Plan   11/30/2011 Office Visit Written 11/30/2011 11:19 AM by Kathlen Brunswick, MD     INRs continue to be variable, but are improved, ranging between 1.6-2.7.  We will continue to monitor closely and adjust warfarin dosage in anticoagulation clinic.    Persistent atrial fibrillation   Last Assessment & Plan   06/17/2012 Office Visit Written 06/17/2012  9:55 AM by Marinus Maw, MD     The patient has maintaining sinus rhythm very nicely it has developed elevated liver function tests on amiodarone. Because of the abnormalities, I've recommended that he stop taking amiodarone. He will continue his anticoagulation and beta blocker therapy will be up titrated.    Hypertension   Last Assessment & Plan   01/01/2012 Office Visit Written 01/01/2012 11:51 AM by Marinus Maw, MD     His blood pressure is well controlled. He'll maintain a low-sodium diet. No change in medical therapy.    Hyperlipidemia   Last Assessment & Plan   05/26/2011 Office Visit Edited 05/26/2011  3:35 PM by Kathlen Brunswick, MD     Control of hyperlipidemia is superlative; current therapy will be continued.    Diabetes mellitus, type 2   Atrial flutter   Last Assessment & Plan   06/15/2011 Office Visit Edited 06/15/2011  9:56 PM by Kathlen Brunswick, MD     Unfortunate, atrial arrhythmias have recurred.  While atrial flutter has replaced atrial fibrillation since dofetilide was started, current flutter is atypical and almost certainly not a recurrence of the arrhythmia that was ablated in 2002.  A repeat ablation is a consideration as well as change to a different antiarrhythmic agent.  It may be that he will tolerate this rhythm better than atrial fibrillation once adequate control of heart rate has been achieved.  For now, we will institute a rate control strategy by restarting digoxin and increasing his dose of metoprolol.  He will return  in one week for reassessment of his heart rate  blood pressure and symptoms by the cardiology nurses.  A return visit to Dr. Ladona Ridgel will also be scheduled.    Tobacco abuse   Chronic obstructive pulmonary disease   Last Assessment & Plan   04/20/2011 Office Visit Written 04/23/2011 10:03 AM by Kathlen Brunswick, MD     Active bronchospasm with symptoms.  Advair will be added to patient's regimen.  Health Department verifies that this medication can be supplied to them and no cost.  Dose of metoprolol will be decreased substantially and it may be necessary to discontinue this drug entirely.    Chest pressure   Last Assessment & Plan   06/17/2012 Office Visit Written 06/17/2012  9:56 AM by Marinus Maw, MD     His symptoms are atypical for coronary ischemia though ischemia cannot be ruled out. With his negative stress test 6 months ago, my plan is to up titrate his beta blocker, and give the patient a prescription for nitroglycerin. If his symptoms worsen, he is instructed to call us and would be scheduled for left heart catheterization.    Chronic kidney disease, stage II (mild)   Abnormal LFTs   Last Assessment & Plan   06/17/2012 Office Visit Written 06/17/2012  9:57 AM by Marinus Maw, MD     His ultrasound showed evidence of a fatty liver. I've recommended that the patient stop his amiodarone therapy. He'll need followup liver function tests in 6 months. If his  liver function tests have not changed while off of amiodarone, I might consider restarting amiodarone, particularly if he develops recurrent symptomatic atrial fibrillation.        Imaging: No results found.

## 2012-08-29 NOTE — Assessment & Plan Note (Signed)
Mild abnormalities of LFTs apparently secondary to amiodarone. Despite evidence for hepatic steatosis, abnormalities normalized following drug discontinuation. Alternative antiarrhythmic for treatment of atrial fibrillation will be selected if required.

## 2012-08-29 NOTE — Patient Instructions (Addendum)
Your physician recommends that you schedule a follow-up appointment in: 3-4 weeks with RR  Your physician has requested that you regularly monitor and record your blood pressure readings at home. Please use the same machine at the same time of day to check your readings and record them to bring to your follow-up visit.  Your physician has recommended you make the following change in your medication:   1) START AMLODIPINE 5MG  ONCE DAILY

## 2012-08-29 NOTE — Assessment & Plan Note (Signed)
No symptoms to suggest recurrent atrial fibrillation, now 4 months following discontinuation of amiodarone. Tendency for recurrent atrial fibrillation may be reduced as the result of better control of hypertension. Amlodipine 5 mg per day will be added to his medical regime, both for better control of hypertension and for treatment of chest discomfort, which is possibly ischemic.

## 2012-08-29 NOTE — Assessment & Plan Note (Signed)
Blood pressure control improved, but diastolics are still borderline. Amlodipine will be added to medical regime to further lower systolic and diastolic pressures. Patient will monitor blood pressures at home or at local pharmacies.

## 2012-08-29 NOTE — Progress Notes (Signed)
Patient ID: [name] Cook, male   DOB: 08-26-1950, 62 y.o.   MRN: [id number]  HPI: Schedule return visit for continuing assessment of chest pain, hypertension, and atrial fibrillation.  Since his last visit, he has done generally well. He has experienced no palpitations. He does note episodes of sharp mid substernal chest pain of moderate intensity associated with lightheadedness. Episodes last a number of minutes with a variable response to sublingual nitroglycerin. They occur daily. Nuclear stress test was negative approximately 8 months ago. Cardiac catheterization performed in 2001 revealed insignificant single-vessel disease with a 50% circumflex lesion.  Current Outpatient Prescriptions  Medication Sig Dispense Refill  . celecoxib (CELEBREX) 200 MG capsule Take 1 capsule (200 mg total) by mouth daily.  30 capsule  0  . chlorthalidone (HYGROTON) 25 MG tablet Take 1 tablet (25 mg total) by mouth daily.  30 tablet  11  . fish oil-omega-3 fatty acids 1000 MG capsule Take 1 g by mouth daily.        . Fluticasone-Salmeterol (ADVAIR) 250-50 MCG/DOSE AEPB Inhale 2 puffs into the lungs 2 (two) times daily. With spacer  60 each  0  . furosemide (LASIX) 20 MG tablet Take 20 mg by mouth as needed. Swelling.      . metoprolol succinate (TOPROL-XL) 50 MG 24 hr tablet Take 1 tablet (50 mg total) by mouth 2 (two) times daily. Take with or immediately following a meal.  60 tablet  5  . nitroGLYCERIN (NITROSTAT) 0.4 MG SL tablet Place 1 tablet (0.4 mg total) under the tongue every 5 (five) minutes x 3 doses as needed for chest pain.  60 tablet  1  . pantoprazole (PROTONIX) 40 MG tablet Take 1 tablet (40 mg total) by mouth daily.  30 tablet  0  . rosuvastatin (CRESTOR) 20 MG tablet Take 1 tablet (20 mg total) by mouth at bedtime.  30 tablet  12  . sitaGLIPtan-metformin (JANUMET) 50-1000 MG per tablet Take 1 tablet by mouth 2 (two) times daily with a meal.  60 tablet  0  . warfarin (COUMADIN) 2.5 MG tablet Take 1-2  tablets (2.5-5 mg total) by mouth daily. Takes 2 tablets on Fridays. 1 tablet every day.  45 tablet  3  . [DISCONTINUED] lisinopril (PRINIVIL,ZESTRIL) 10 MG tablet Take 1 tablet (10 mg total) by mouth daily.  30 tablet  3   No current facility-administered medications for this visit.   No Known Allergies   Past medical history, social history, and family history reviewed and updated.  ROS: Denies syncope, peripheral edema, hematemesis or melena. All other systems reviewed and are negative.  PHYSICAL EXAM: BP 138/90  Pulse 88  Ht 6\' 2"  (1.88 m)  Wt 106.595 kg (235 lb)  BMI 30.16 kg/m2;  Body mass index is 30.16 kg/(m^2). General-Well developed; no acute distress Body habitus-mildly overweight Neck-No JVD; no carotid bruits Lungs-clear lung fields; resonant to percussion Cardiovascular-normal PMI; normal S1 and S2; modest early systolic ejection murmur Abdomen-normal bowel sounds; soft and non-tender without masses or organomegaly Musculoskeletal-No deformities, no cyanosis or clubbing Neurologic-Normal cranial nerves; symmetric strength and tone Skin-Warm, no significant lesions Extremities-distal pulses intact; no edema  Rockland Bing, MD 08/29/2012  3:26 PM  ASSESSMENT AND PLAN

## 2012-08-30 ENCOUNTER — Telehealth: Payer: Self-pay | Admitting: *Deleted

## 2012-08-30 MED ORDER — BLOOD PRESSURE CUFF MISC: Status: DC

## 2012-08-30 MED ORDER — AMLODIPINE BESYLATE 5 MG PO TABS: 5.0000 mg | ORAL_TABLET | Freq: Every day | ORAL | Status: DC

## 2012-08-30 NOTE — Telephone Encounter (Signed)
Pt walked into office to advise his medication was not called into the pharmacy, apologized for inconvience, called [facility] and spoke to Harrold Donath to rush order, Harrold Donath advised that [name] do not pay for blood pressure cuffs however to send the RX anyway and they will be able to confirm once the pt comes in to pick up the amlodipine 5 mg once daily RX, pt understood called order in verbally and sent via esribe as well. Pt understood

## 2012-09-17 ENCOUNTER — Ambulatory Visit: Payer: Medicare Other | Admitting: Adult Health

## 2012-09-26 ENCOUNTER — Encounter: Payer: Self-pay | Admitting: Adult Health

## 2012-09-26 ENCOUNTER — Ambulatory Visit (INDEPENDENT_AMBULATORY_CARE_PROVIDER_SITE_OTHER): Payer: Medicare Other | Admitting: *Deleted

## 2012-09-26 ENCOUNTER — Ambulatory Visit (INDEPENDENT_AMBULATORY_CARE_PROVIDER_SITE_OTHER): Payer: Medicare Other | Admitting: Adult Health

## 2012-09-26 VITALS — BP 108/56 | HR 74 | Ht 72.0 in | Wt 233.0 lb

## 2012-09-26 DIAGNOSIS — I4891 Unspecified atrial fibrillation: Secondary | ICD-10-CM

## 2012-09-26 DIAGNOSIS — I4819 Other persistent atrial fibrillation: Secondary | ICD-10-CM

## 2012-09-26 DIAGNOSIS — I10 Essential (primary) hypertension: Secondary | ICD-10-CM

## 2012-09-26 DIAGNOSIS — I4892 Unspecified atrial flutter: Secondary | ICD-10-CM

## 2012-09-26 DIAGNOSIS — G4730 Sleep apnea, unspecified: Secondary | ICD-10-CM

## 2012-09-26 DIAGNOSIS — Z7901 Long term (current) use of anticoagulants: Secondary | ICD-10-CM

## 2012-09-26 LAB — POCT INR: INR: 2

## 2012-09-26 NOTE — Progress Notes (Signed)
HPI: [name] is a 62-year-old patient of Dr. [name] we are following for ongoing assessment and management of chest pain, hypertension, and atrial fibrillation on chronic Coumadin.     The patient had last been seen by Dr. [name] on 08/29/2012. He was found to have elevated LFTs, mild and felt to be related to amiodarone therapy. This was discontinued. Amlodipine 5 mg was added to his medication regimen for better hypertensive response and treatment of chest pain. He is here for ongoing evaluation of his symptoms.     Feeling well. No complaints of chest pain or episodes of tachycardia or palpitations. He has a history of sleep apnea but is not wearing CPAP.    No Known Allergies  Current Outpatient Prescriptions  Medication Sig Dispense Refill  . amLODipine (NORVASC) 5 MG tablet Take 1 tablet (5 mg total) by mouth daily.  30 tablet  11  . Blood Pressure Monitoring (BLOOD PRESSURE CUFF) MISC ONE BLOOD PRESSURE CUFF PER DR RR TO HAVE PT CHECK BLOOD PRESSURE DAILY IN HOME, USE AS DIRECTED, ONE UNIT NO REFILLS  1 each  0  . celecoxib (CELEBREX) 200 MG capsule Take 1 capsule (200 mg total) by mouth daily.  30 capsule  0  . chlorthalidone (HYGROTON) 25 MG tablet Take 1 tablet (25 mg total) by mouth daily.  30 tablet  11  . fish oil-omega-3 fatty acids 1000 MG capsule Take 1 g by mouth daily.        . Fluticasone-Salmeterol (ADVAIR) 250-50 MCG/DOSE AEPB Inhale 2 puffs into the lungs 2 (two) times daily. With spacer  60 each  0  . furosemide (LASIX) 20 MG tablet Take 20 mg by mouth as needed. Swelling.      . metoprolol succinate (TOPROL-XL) 50 MG 24 hr tablet Take 1 tablet (50 mg total) by mouth 2 (two) times daily. Take with or immediately following a meal.  60 tablet  5  . nitroGLYCERIN (NITROSTAT) 0.4 MG SL tablet Place 1 tablet (0.4 mg total) under the tongue every 5 (five) minutes x 3 doses as needed for chest pain.  60 tablet  1  . pantoprazole (PROTONIX) 40 MG tablet Take 1 tablet (40 mg  total) by mouth daily.  30 tablet  0  . rosuvastatin (CRESTOR) 20 MG tablet Take 1 tablet (20 mg total) by mouth at bedtime.  30 tablet  12  . sitaGLIPtan-metformin (JANUMET) 50-1000 MG per tablet Take 1 tablet by mouth 2 (two) times daily with a meal.  60 tablet  0  . warfarin (COUMADIN) 2.5 MG tablet Take 1-2 tablets (2.5-5 mg total) by mouth daily. Takes 2 tablets on Fridays. 1 tablet every day.  45 tablet  3  . [DISCONTINUED] lisinopril (PRINIVIL,ZESTRIL) 10 MG tablet Take 1 tablet (10 mg total) by mouth daily.  30 tablet  3   No current facility-administered medications for this visit.    Past Medical History  Diagnosis Date  . Persistent atrial fibrillation     onset in 2009; cardiac cath in 2001-50% circumflex; recurred 04/2010, 03/2011; ventricular rate controlled; normal coronary angiography in 2001; tachycardia mediated cardiomyopathy;  Tikosyn initiated 05/2011  . Hypertension     with hypertensive heart disease  . Hyperlipidemia     04/2008:220, 289, 43, 119  . Atrial flutter     radiofrequency ablation in 2002  . Obesity   . Chronic anticoagulation   . Tobacco abuse   . Chronic obstructive pulmonary disease 04/20/2011  . GERD (gastroesophageal reflux  disease)   . Arthritis   . Diabetes mellitus, type 2     No insulin    Past Surgical History  Procedure Laterality Date  . Atrial ablation surgery  2002    atrial flutter; subsequently developed atrial fibrillation  . Implant-ilr      Subsequently removed  . Cardioversion  05/31/2011    Procedure: CARDIOVERSION;  Surgeon: [name]. [name], MD;  Location: [facility];  Service: Cardiovascular;  Laterality: N/A;    Family History  Problem Relation Age of Onset  . Alzheimer's disease Mother   . Osteoporosis Mother     History   Social History  . Marital Status: Legally Separated    Spouse Name: N/A    Number of Children: 0  . Years of Education: N/A   Occupational History  . Unemployed    Social History Main  Topics  . Smoking status: Former Smoker -- 0.50 packs/day for 42 years    Types: Cigarettes    Quit date: 12/31/2011  . Smokeless tobacco: Never Used     Comment: he is trying to quit  . Alcohol Use: No  . Drug Use: No  . Sexually Active: Yes   Other Topics Concern  . Not on file   Social History Narrative   Unable to afford medications   Resides with girlfriend      Pt lives in [place]   Disabled (arthritis), previously worked at an [employer] and [employer] treatment center.          NFA:OZHYQM of systems complete and found to be negative unless listed above PHYSICAL EXAM BP 108/56  Pulse 74  Ht 6' (1.829 m)  Wt 233 lb (105.688 kg)  BMI 31.59 kg/m2 General: Well developed, well nourished, in no acute distress Head: Eyes PERRLA, No xanthomas.   Normal cephalic and atramatic  Lungs: Clear bilaterally to auscultation and percussion. Heart: HRRR S1 S2, without MRG.  Pulses are 2+ & equal.            No carotid bruit. No JVD.  No abdominal bruits. No femoral bruits. Abdomen: Bowel sounds are positive, abdomen soft and non-tender without masses or                  Hernia's noted. Msk:  Back normal, normal gait. Normal strength and tone for age. Extremities: No clubbing, cyanosis or edema.  DP +1 Neuro: Alert and oriented X 3. Psych:  Good affect, responds appropriately    ASSESSMENT AND PLAN

## 2012-09-26 NOTE — Patient Instructions (Addendum)
Your physician recommends that you schedule a follow-up appointment in: 6 MONTHS With [facility] and apt to be made with Dr Leonia Reeves  Your physician recommends that you continue on your current medications as directed. Please refer to the Current Medication list given to you today.  A referral has been placed for DR Juanetta Gosling office once apt available a member of our staff will contact you with apt date and time, If you have not heard from our office within 2 weeks please call [phone]

## 2012-09-26 NOTE — Assessment & Plan Note (Signed)
He will have appt made with Dr.Taylor as he is overdue to have this.

## 2012-09-26 NOTE — Assessment & Plan Note (Signed)
Blood pressure is much better controlled with the addition of amlodipine. He has a history of OSA but has not been wearing CPAP for 10 years due to inability to feel comfortable using it. I will refer him to see Dr. Juanetta Gosling for evaluation and treatment choices.

## 2012-09-26 NOTE — Assessment & Plan Note (Addendum)
Currently in NSR. Continue medical regimen as directed. Denies palpitations or racing heart rate. Continues on coumadin.

## 2012-10-07 ENCOUNTER — Telehealth: Payer: Self-pay | Admitting: Cardiology

## 2012-10-07 NOTE — Telephone Encounter (Signed)
Would like return phone call / tgs  °

## 2012-10-08 NOTE — Telephone Encounter (Signed)
Pt advised that we called him and he was returning our call, apologized that no one from our office contacted him per review of chart, was unable to locate a phone call out to the pt, pt understood

## 2012-10-09 ENCOUNTER — Telehealth: Payer: Self-pay | Admitting: *Deleted

## 2012-10-09 NOTE — Telephone Encounter (Signed)
Noted  

## 2012-10-09 NOTE — Telephone Encounter (Signed)
Message copied by Ovidio Kin on Wed Oct 09, 2012  8:45 AM ------      Message from: Ashley Akin      Created: Tue Oct 01, 2012 10:40 AM       Patient was referred to Dr.Hawkins.  Information was faxed and appointment was made but patient is not wanting to keep appointment right now due to insurance reasons per Patsy Lager at Dr.Hawkins office.            Thanks,      Aurther Loft ------

## 2012-10-09 NOTE — Telephone Encounter (Signed)
FYI: the notation below

## 2012-10-23 ENCOUNTER — Telehealth: Payer: Self-pay | Admitting: *Deleted

## 2012-10-23 NOTE — Telephone Encounter (Signed)
Renea Ee from the pharmacy is calling to see if we will fill rx for patient for lance strip and meter for diabetic testing. (they called last week and I advised them to f/u with PCP and or [facility].  Pt states he doesn't have a PCP at the moment we are the only doctor he is seeing.   Pharmacy tried to get filled from [facility] and they faxed back stating that this patient is no longer seen by them PERIOD!  In the meanwhile this patient is going with out his supplies.

## 2012-10-24 ENCOUNTER — Ambulatory Visit (INDEPENDENT_AMBULATORY_CARE_PROVIDER_SITE_OTHER): Payer: Medicare HMO | Admitting: *Deleted

## 2012-10-24 DIAGNOSIS — Z7901 Long term (current) use of anticoagulants: Secondary | ICD-10-CM

## 2012-10-24 DIAGNOSIS — I4892 Unspecified atrial flutter: Secondary | ICD-10-CM

## 2012-10-24 DIAGNOSIS — I4819 Other persistent atrial fibrillation: Secondary | ICD-10-CM

## 2012-10-24 DIAGNOSIS — I4891 Unspecified atrial fibrillation: Secondary | ICD-10-CM

## 2012-10-24 LAB — POCT INR: INR: 1.7

## 2012-10-24 NOTE — Telephone Encounter (Signed)
Left message to advise pt that our office policy is not to manage diabetes however he can reach out to HD rep Blima Singer to be set up for DM supplies via number [phone] also advised pt to call back to our office with any further concerns if necessary

## 2012-10-29 ENCOUNTER — Other Ambulatory Visit: Payer: Self-pay | Admitting: Cardiology

## 2012-10-30 ENCOUNTER — Other Ambulatory Visit: Payer: Self-pay | Admitting: *Deleted

## 2012-10-30 MED ORDER — WARFARIN SODIUM 2.5 MG PO TABS: 2.5000 mg | ORAL_TABLET | Freq: Every day | ORAL | Status: DC

## 2012-11-18 ENCOUNTER — Ambulatory Visit (INDEPENDENT_AMBULATORY_CARE_PROVIDER_SITE_OTHER): Payer: Medicare HMO | Admitting: *Deleted

## 2012-11-18 DIAGNOSIS — I4892 Unspecified atrial flutter: Secondary | ICD-10-CM

## 2012-11-18 DIAGNOSIS — I4819 Other persistent atrial fibrillation: Secondary | ICD-10-CM

## 2012-11-18 DIAGNOSIS — Z7901 Long term (current) use of anticoagulants: Secondary | ICD-10-CM

## 2012-11-18 DIAGNOSIS — I4891 Unspecified atrial fibrillation: Secondary | ICD-10-CM

## 2012-11-18 LAB — POCT INR: INR: 2.6

## 2012-12-19 ENCOUNTER — Other Ambulatory Visit: Payer: Self-pay | Admitting: Cardiology

## 2012-12-25 ENCOUNTER — Ambulatory Visit (INDEPENDENT_AMBULATORY_CARE_PROVIDER_SITE_OTHER): Payer: Medicare HMO | Admitting: Internal Medicine

## 2012-12-25 ENCOUNTER — Ambulatory Visit (INDEPENDENT_AMBULATORY_CARE_PROVIDER_SITE_OTHER): Payer: Medicare HMO | Admitting: *Deleted

## 2012-12-25 ENCOUNTER — Encounter: Payer: Self-pay | Admitting: Internal Medicine

## 2012-12-25 VITALS — BP 110/80 | HR 90 | Ht 72.0 in | Wt 228.0 lb

## 2012-12-25 DIAGNOSIS — I4891 Unspecified atrial fibrillation: Secondary | ICD-10-CM

## 2012-12-25 DIAGNOSIS — I4819 Other persistent atrial fibrillation: Secondary | ICD-10-CM

## 2012-12-25 DIAGNOSIS — I4892 Unspecified atrial flutter: Secondary | ICD-10-CM

## 2012-12-25 DIAGNOSIS — I10 Essential (primary) hypertension: Secondary | ICD-10-CM

## 2012-12-25 DIAGNOSIS — Z7901 Long term (current) use of anticoagulants: Secondary | ICD-10-CM

## 2012-12-25 LAB — POCT INR: INR: 2.4

## 2012-12-25 MED ORDER — DRONEDARONE HCL 400 MG PO TABS: 400.0000 mg | ORAL_TABLET | Freq: Two times a day (BID) | ORAL | Status: DC

## 2012-12-25 NOTE — Assessment & Plan Note (Signed)
On medical therapy, his blood pressure is well controlled. He will maintain a low-sodium diet. No change in medical therapy.

## 2012-12-25 NOTE — Patient Instructions (Addendum)
Your physician recommends that you schedule a follow-up appointment in: 3 MONTHS WITH DR Ladona Ridgel  Your physician has recommended you make the following change in your medication:  1. START MULTAQ 400 MG TWICE A DAY WITH MEALS

## 2012-12-25 NOTE — Assessment & Plan Note (Signed)
The patient is maintaining sinus rhythm for now. He had no success in maintaining sinus rhythm on dofetilide. He is maintaining sinus rhythm on amiodarone, but developed elevated liver function tests. His amiodarone was discontinued approximately 6 months ago. Because the patient has become so sick in the past when he goes into atrial fibrillation, I've recommended that we start taking Multaq 400 mg twice daily with food. I'll see him back in 3 months in followup. If the Multaq does not work, catheter ablation would also be a consideration as would AV node ablation and pacemaker insertion.

## 2012-12-25 NOTE — Progress Notes (Signed)
HPI [name] Cook returns today for followup. He is a very pleasant 62 year old man with a history of persistent atrial fibrillation and hypertension and diastolic heart failure. The patient was doing quite well on amiodarone but developed elevated liver function tests and his amiodarone was discontinued approximately 6 months ago. Prior to this, he had been on dofetilide with no success in maintaining sinus rhythm. He has occasional palpitations  And episodes of dyspnea but overall has been stable. He denies peripheral edema. No nausea or vomiting or abdominal pain. No Known Allergies   Current Outpatient Prescriptions  Medication Sig Dispense Refill  . amLODipine (NORVASC) 5 MG tablet Take 1 tablet (5 mg total) by mouth daily.  30 tablet  11  . Blood Pressure Monitoring (BLOOD PRESSURE CUFF) MISC ONE BLOOD PRESSURE CUFF PER DR RR TO HAVE PT CHECK BLOOD PRESSURE DAILY IN HOME, USE AS DIRECTED, ONE UNIT NO REFILLS  1 each  0  . celecoxib (CELEBREX) 200 MG capsule Take 1 capsule (200 mg total) by mouth daily.  30 capsule  0  . chlorthalidone (HYGROTON) 25 MG tablet TAKE 1/2 TABLET BY MOUTH DAILY.  15 tablet  4  . fish oil-omega-3 fatty acids 1000 MG capsule Take 1 g by mouth daily.        . Fluticasone-Salmeterol (ADVAIR) 250-50 MCG/DOSE AEPB Inhale 2 puffs into the lungs 2 (two) times daily. With spacer  60 each  0  . furosemide (LASIX) 20 MG tablet Take 20 mg by mouth as needed. Swelling.      . metoprolol succinate (TOPROL-XL) 50 MG 24 hr tablet Take 1 tablet (50 mg total) by mouth 2 (two) times daily. Take with or immediately following a meal.  60 tablet  5  . nitroGLYCERIN (NITROSTAT) 0.4 MG SL tablet Place 1 tablet (0.4 mg total) under the tongue every 5 (five) minutes x 3 doses as needed for chest pain.  60 tablet  1  . pantoprazole (PROTONIX) 40 MG tablet Take 1 tablet (40 mg total) by mouth daily.  30 tablet  0  . rosuvastatin (CRESTOR) 20 MG tablet Take 1 tablet (20 mg total) by mouth at  bedtime.  30 tablet  12  . sitaGLIPtan-metformin (JANUMET) 50-1000 MG per tablet Take 1 tablet by mouth 2 (two) times daily with a meal.  60 tablet  0  . warfarin (COUMADIN) 2.5 MG tablet Take 1-2 tablets (2.5-5 mg total) by mouth daily. Takes 2 tablets on Fridays. 1 tablet every day.  45 tablet  3  . [DISCONTINUED] lisinopril (PRINIVIL,ZESTRIL) 10 MG tablet Take 1 tablet (10 mg total) by mouth daily.  30 tablet  3   No current facility-administered medications for this visit.     Past Medical History  Diagnosis Date  . Persistent atrial fibrillation     onset in 2009; cardiac cath in 2001-50% circumflex; recurred 04/2010, 03/2011; ventricular rate controlled; normal coronary angiography in 2001; tachycardia mediated cardiomyopathy;  Tikosyn initiated 05/2011  . Hypertension     with hypertensive heart disease  . Hyperlipidemia     04/2008:220, 289, 43, 119  . Atrial flutter     radiofrequency ablation in 2002  . Obesity   . Chronic anticoagulation   . Tobacco abuse   . Chronic obstructive pulmonary disease 04/20/2011  . GERD (gastroesophageal reflux disease)   . Arthritis   . Diabetes mellitus, type 2     No insulin    ROS:   All systems reviewed and negative except as noted in  the HPI.   Past Surgical History  Procedure Laterality Date  . Atrial ablation surgery  2002    atrial flutter; subsequently developed atrial fibrillation  . Implant-ilr      Subsequently removed  . Cardioversion  05/31/2011    Procedure: CARDIOVERSION;  Surgeon: [name]. [name], MD;  Location: [facility];  Service: Cardiovascular;  Laterality: N/A;     Family History  Problem Relation Age of Onset  . Alzheimer's disease Mother   . Osteoporosis Mother      History   Social History  . Marital Status: Legally Separated    Spouse Name: N/A    Number of Children: 0  . Years of Education: N/A   Occupational History  . Unemployed    Social History Main Topics  . Smoking status: Former Smoker  -- 0.50 packs/day for 42 years    Types: Cigarettes    Quit date: 12/31/2011  . Smokeless tobacco: Never Used     Comment: he is trying to quit  . Alcohol Use: No  . Drug Use: No  . Sexual Activity: Yes   Other Topics Concern  . 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           BP 110/80  Pulse 90  Ht 6' (1.829 m)  Wt 228 lb (103.42 kg)  BMI 30.92 kg/m2  SpO2 92%  Physical Exam:  Well appearing middle-aged man, NAD HEENT: Unremarkable Neck:  6 cm JVD, no thyromegally Back:  No CVA tenderness Lungs:  Clear with no wheezes, rales, or rhonchi. HEART:  Regular rate rhythm, no murmurs, no rubs, no clicks Abd:  soft, positive bowel sounds, no organomegally, no rebound, no guarding Ext:  2 plus pulses, no edema, no cyanosis, no clubbing Skin:  No rashes no nodules Neuro:  CN II through XII intact, motor grossly intact  EKG - normal sinus rhythm    Assess/Plan:

## 2012-12-30 ENCOUNTER — Telehealth: Payer: Self-pay | Admitting: Internal Medicine

## 2012-12-30 NOTE — Telephone Encounter (Signed)
Called pt to confirm current signs and symptoms per noted pt was started on Multaq 400mg  BID per DR GT on 12-25-12 however noted pt unavailable and vm box has not been set up yet, will call again

## 2012-12-30 NOTE — Telephone Encounter (Signed)
Patient just started medication and states that he is having problems.  Could not remember name of medication.

## 2012-12-31 NOTE — Telephone Encounter (Signed)
Pt noted the medication made him nauseated and dizzy, pt has stopped taking the medication this past Friday two days after starting the medication, please advise if any further instructions needed

## 2013-01-13 ENCOUNTER — Telehealth: Payer: Self-pay | Admitting: *Deleted

## 2013-01-13 NOTE — Telephone Encounter (Signed)
Noted pt call forwarded to DR GT, noted DR GT in office today and gave verbal instructions for the pt to increase his Toprol XL 50mg  by taking ONE and a Half tablet twice daily, pt understood and declined a refill per notes he has over 200 pills at this time and will call office when refill is needed, pt also advised to come into office 01-27-13 at 4:15pm to have a f/u EKG via nurse visit per Dr Leonia Reeves, pt accepted apt and will increase medication as advised Pt will call back to our office with any further concerns if necessary

## 2013-01-13 NOTE — Telephone Encounter (Signed)
PT STILL HAS NOT HEARD BACK FOR PHONE CALL ON 12/30/12/ TMJ

## 2013-01-15 ENCOUNTER — Observation Stay (HOSPITAL_COMMUNITY)
Admission: EM | Admit: 2013-01-15 | Discharge: 2013-01-15 | DRG: 310 | Disposition: A | Discharge status: Home / Self Care | Payer: Medicare HMO | Attending: Family Medicine | Admitting: Family Medicine

## 2013-01-15 ENCOUNTER — Emergency Department (HOSPITAL_COMMUNITY): Payer: Medicare HMO

## 2013-01-15 ENCOUNTER — Encounter (HOSPITAL_COMMUNITY): Payer: Self-pay

## 2013-01-15 DIAGNOSIS — E669 Obesity, unspecified: Secondary | ICD-10-CM | POA: Diagnosis present

## 2013-01-15 DIAGNOSIS — I4892 Unspecified atrial flutter: Secondary | ICD-10-CM | POA: Diagnosis present

## 2013-01-15 DIAGNOSIS — I110 Hypertensive heart disease with heart failure: Secondary | ICD-10-CM | POA: Diagnosis present

## 2013-01-15 DIAGNOSIS — E785 Hyperlipidemia, unspecified: Secondary | ICD-10-CM | POA: Diagnosis present

## 2013-01-15 DIAGNOSIS — E119 Type 2 diabetes mellitus without complications: Secondary | ICD-10-CM | POA: Diagnosis present

## 2013-01-15 DIAGNOSIS — I509 Heart failure, unspecified: Secondary | ICD-10-CM | POA: Diagnosis present

## 2013-01-15 DIAGNOSIS — I4891 Unspecified atrial fibrillation: Secondary | ICD-10-CM

## 2013-01-15 DIAGNOSIS — I10 Essential (primary) hypertension: Secondary | ICD-10-CM

## 2013-01-15 DIAGNOSIS — Z7901 Long term (current) use of anticoagulants: Secondary | ICD-10-CM

## 2013-01-15 DIAGNOSIS — E1165 Type 2 diabetes mellitus with hyperglycemia: Secondary | ICD-10-CM | POA: Diagnosis present

## 2013-01-15 DIAGNOSIS — M129 Arthropathy, unspecified: Secondary | ICD-10-CM | POA: Diagnosis present

## 2013-01-15 DIAGNOSIS — G4733 Obstructive sleep apnea (adult) (pediatric): Secondary | ICD-10-CM | POA: Diagnosis present

## 2013-01-15 DIAGNOSIS — Z87891 Personal history of nicotine dependence: Secondary | ICD-10-CM

## 2013-01-15 DIAGNOSIS — J4489 Other specified chronic obstructive pulmonary disease: Secondary | ICD-10-CM | POA: Diagnosis present

## 2013-01-15 DIAGNOSIS — K219 Gastro-esophageal reflux disease without esophagitis: Secondary | ICD-10-CM | POA: Diagnosis present

## 2013-01-15 DIAGNOSIS — J449 Chronic obstructive pulmonary disease, unspecified: Secondary | ICD-10-CM | POA: Diagnosis present

## 2013-01-15 DIAGNOSIS — Z79899 Other long term (current) drug therapy: Secondary | ICD-10-CM

## 2013-01-15 DIAGNOSIS — E876 Hypokalemia: Secondary | ICD-10-CM

## 2013-01-15 LAB — TROPONIN I
Troponin I: <0.3 ng/mL (ref <0.30)
Troponin I: <0.3 ng/mL (ref <0.30)
Troponin I: <0.3 ng/mL (ref <0.30)

## 2013-01-15 LAB — BASIC METABOLIC PANEL
BUN: 24 mg/dL — ABNORMAL HIGH (ref 6–23)
CO2: 27 mEq/L (ref 19–32)
Calcium: 9.9 mg/dL (ref 8.4–10.5)
Chloride: 99 mEq/L (ref 96–112)
Creatinine, Ser: 1.17 mg/dL (ref 0.50–1.35)
GFR calc Af Amer: 77 mL/min — ABNORMAL LOW (ref >90)
GFR calc non Af Amer: 66 mL/min — ABNORMAL LOW (ref >90)
Glucose, Bld: 160 mg/dL — ABNORMAL HIGH (ref 70–99)
Potassium: 3.2 mEq/L — ABNORMAL LOW (ref 3.5–5.1)
Sodium: 139 mEq/L (ref 135–145)

## 2013-01-15 LAB — GLUCOSE, CAPILLARY
Glucose-Capillary: 101 mg/dL — ABNORMAL HIGH (ref 70–99)
Glucose-Capillary: 137 mg/dL — ABNORMAL HIGH (ref 70–99)

## 2013-01-15 LAB — CBC WITH DIFFERENTIAL/PLATELET
Basophils Absolute: 0.1 10*3/uL (ref 0.0–0.1)
Basophils Relative: 1 % (ref 0–1)
Eosinophils Absolute: 0.4 10*3/uL (ref 0.0–0.7)
Eosinophils Relative: 5 % (ref 0–5)
HCT: 41.1 % (ref 39.0–52.0)
Hemoglobin: 14.2 g/dL (ref 13.0–17.0)
Lymphocytes Relative: 42 % (ref 12–46)
Lymphs Abs: 3.6 10*3/uL (ref 0.7–4.0)
MCH: 31.8 pg (ref 26.0–34.0)
MCHC: 34.5 g/dL (ref 30.0–36.0)
MCV: 92.2 fL (ref 78.0–100.0)
Monocytes Absolute: 0.6 10*3/uL (ref 0.1–1.0)
Monocytes Relative: 7 % (ref 3–12)
Neutro Abs: 3.9 10*3/uL (ref 1.7–7.7)
Neutrophils Relative %: 45 % (ref 43–77)
Platelets: 174 10*3/uL (ref 150–400)
RBC: 4.46 MIL/uL (ref 4.22–5.81)
RDW: 14.1 % (ref 11.5–15.5)
WBC: 8.6 10*3/uL (ref 4.0–10.5)

## 2013-01-15 LAB — POCT I-STAT TROPONIN I: Troponin i, poc: 0.07 ng/mL (ref 0.00–0.08)

## 2013-01-15 LAB — PROTIME-INR
INR: 1.15 (ref 0.00–1.49)
Prothrombin Time: 14.5 seconds (ref 11.6–15.2)

## 2013-01-15 LAB — MRSA PCR SCREENING: MRSA by PCR: NEGATIVE

## 2013-01-15 MED ORDER — METOPROLOL SUCCINATE ER 50 MG PO TB24: 100.0000 mg | ORAL_TABLET | Freq: Two times a day (BID) | ORAL | Status: DC

## 2013-01-15 MED ORDER — ACETAMINOPHEN 325 MG PO TABS: 650.0000 mg | ORAL_TABLET | Freq: Four times a day (QID) | ORAL | Status: DC | PRN

## 2013-01-15 MED ORDER — WARFARIN SODIUM 7.5 MG PO TABS
7.5000 mg | ORAL_TABLET | Freq: Once | ORAL | Status: AC
  Administered 2013-01-15: 7.5 mg via ORAL
  Filled 2013-01-15: qty 1

## 2013-01-15 MED ORDER — CELECOXIB 100 MG PO CAPS
200.0000 mg | ORAL_CAPSULE | Freq: Every day | ORAL | Status: DC
  Administered 2013-01-15: 200 mg via ORAL
  Filled 2013-01-15 (×3): qty 2

## 2013-01-15 MED ORDER — ATORVASTATIN CALCIUM 40 MG PO TABS: 40.0000 mg | ORAL_TABLET | Freq: Every day | ORAL | Status: DC

## 2013-01-15 MED ORDER — DILTIAZEM HCL 25 MG/5ML IV SOLN
10.0000 mg | Freq: Once | INTRAVENOUS | Status: AC
  Administered 2013-01-15: 10 mg via INTRAVENOUS

## 2013-01-15 MED ORDER — DILTIAZEM HCL 25 MG/5ML IV SOLN
10.0000 mg | Freq: Once | INTRAVENOUS | Status: AC
  Administered 2013-01-15: 10 mg via INTRAVENOUS
  Filled 2013-01-15: qty 5

## 2013-01-15 MED ORDER — METOPROLOL SUCCINATE ER 50 MG PO TB24
75.0000 mg | ORAL_TABLET | Freq: Two times a day (BID) | ORAL | Status: DC
  Administered 2013-01-15: 75 mg via ORAL
  Filled 2013-01-15 (×2): qty 1

## 2013-01-15 MED ORDER — SODIUM CHLORIDE 0.9 % IJ SOLN
3.0000 mL | Freq: Two times a day (BID) | INTRAMUSCULAR | Status: DC
  Administered 2013-01-15: 3 mL via INTRAVENOUS

## 2013-01-15 MED ORDER — AMLODIPINE BESYLATE 5 MG PO TABS
2.5000 mg | ORAL_TABLET | Freq: Every day | ORAL | Status: DC
  Administered 2013-01-15: 2.5 mg via ORAL
  Filled 2013-01-15: qty 1

## 2013-01-15 MED ORDER — POTASSIUM CHLORIDE 10 MEQ/100ML IV SOLN
10.0000 meq | INTRAVENOUS | Status: AC
  Administered 2013-01-15 (×3): 10 meq via INTRAVENOUS
  Filled 2013-01-15 (×2): qty 100

## 2013-01-15 MED ORDER — METOPROLOL TARTRATE 1 MG/ML IV SOLN
5.0000 mg | Freq: Once | INTRAVENOUS | Status: AC
  Administered 2013-01-15: 5 mg via INTRAVENOUS
  Filled 2013-01-15: qty 5

## 2013-01-15 MED ORDER — ACETAMINOPHEN 650 MG RE SUPP: 650.0000 mg | Freq: Four times a day (QID) | RECTAL | Status: DC | PRN

## 2013-01-15 MED ORDER — MOMETASONE FURO-FORMOTEROL FUM 100-5 MCG/ACT IN AERO
2.0000 | INHALATION_SPRAY | Freq: Two times a day (BID) | RESPIRATORY_TRACT | Status: DC
  Administered 2013-01-15: 2 via RESPIRATORY_TRACT
  Filled 2013-01-15: qty 8.8

## 2013-01-15 MED ORDER — INSULIN ASPART 100 UNIT/ML ~~LOC~~ SOLN: 0.0000 [IU] | Freq: Every day | SUBCUTANEOUS | Status: DC

## 2013-01-15 MED ORDER — SODIUM CHLORIDE 0.9 % IJ SOLN: 3.0000 mL | INTRAMUSCULAR | Status: DC | PRN

## 2013-01-15 MED ORDER — PANTOPRAZOLE SODIUM 40 MG PO TBEC
40.0000 mg | DELAYED_RELEASE_TABLET | Freq: Every day | ORAL | Status: DC
  Administered 2013-01-15: 40 mg via ORAL
  Filled 2013-01-15: qty 1

## 2013-01-15 MED ORDER — WARFARIN - PHARMACIST DOSING INPATIENT: Status: DC

## 2013-01-15 MED ORDER — ENOXAPARIN SODIUM 100 MG/ML ~~LOC~~ SOLN
100.0000 mg | Freq: Two times a day (BID) | SUBCUTANEOUS | Status: DC
  Administered 2013-01-15: 100 mg via SUBCUTANEOUS
  Filled 2013-01-15: qty 1

## 2013-01-15 MED ORDER — SODIUM CHLORIDE 0.9 % IV SOLN
250.0000 mL | INTRAVENOUS | Status: DC | PRN
Start: 2013-01-15 — End: 2013-01-15

## 2013-01-15 MED ORDER — AMLODIPINE BESYLATE 5 MG PO TABS: 5.0000 mg | ORAL_TABLET | Freq: Every day | ORAL | Status: DC

## 2013-01-15 MED ORDER — DILTIAZEM HCL 100 MG IV SOLR
5.0000 mg/h | INTRAVENOUS | Status: DC
  Administered 2013-01-15: 5 mg/h via INTRAVENOUS
  Filled 2013-01-15: qty 100

## 2013-01-15 MED ORDER — POTASSIUM CHLORIDE CRYS ER 20 MEQ PO TBCR
40.0000 meq | EXTENDED_RELEASE_TABLET | Freq: Once | ORAL | Status: AC
  Administered 2013-01-15: 40 meq via ORAL
  Filled 2013-01-15: qty 2

## 2013-01-15 MED ORDER — INSULIN ASPART 100 UNIT/ML ~~LOC~~ SOLN
0.0000 [IU] | Freq: Three times a day (TID) | SUBCUTANEOUS | Status: DC
  Administered 2013-01-15: 2 [IU] via SUBCUTANEOUS

## 2013-01-15 NOTE — Progress Notes (Addendum)
ANTICOAGULATION CONSULT NOTE - Initial Consult  Pharmacy Consult for Coumadin (chronic Rx PTA) Indication: atrial fibrillation  No Known Allergies  Patient Measurements: Height: 6' (182.9 cm) Weight: 231 lb 4.2 oz (104.9 kg) IBW/kg (Calculated) : 77.6  Vital Signs: Temp: 97.5 F (36.4 C) (10/01 0728) Temp src: Oral (10/01 0728) BP: 103/69 mmHg (10/01 0946) Pulse Rate: 67 (10/01 0946)  Labs:  Recent Labs  01/15/13 0230 01/15/13 0429  HGB 14.2  --   HCT 41.1  --   PLT 174  --   LABPROT 14.5  --   INR 1.15  --   CREATININE 1.17  --   TROPONINI <0.30 <0.30   Estimated Creatinine Clearance: 84 ml/min (by C-G formula based on Cr of 1.17).  Medical History: Past Medical History  Diagnosis Date  . Persistent atrial fibrillation     onset in 2009; cardiac cath in 2001-50% circumflex; recurred 04/2010, 03/2011; ventricular rate controlled; normal coronary angiography in 2001; tachycardia mediated cardiomyopathy;  Tikosyn initiated 05/2011  . Hypertension     with hypertensive heart disease  . Hyperlipidemia     04/2008:220, 289, 43, 119  . Atrial flutter     radiofrequency ablation in 2002  . Obesity   . Chronic anticoagulation   . Tobacco abuse   . Chronic obstructive pulmonary disease 04/20/2011  . GERD (gastroesophageal reflux disease)   . Arthritis   . Diabetes mellitus, type 2     No insulin    Medications:  Prescriptions prior to admission  Medication Sig Dispense Refill  . amLODipine (NORVASC) 5 MG tablet Take 1 tablet (5 mg total) by mouth daily.  30 tablet  11  . celecoxib (CELEBREX) 200 MG capsule Take 1 capsule (200 mg total) by mouth daily.  30 capsule  0  . chlorthalidone (HYGROTON) 25 MG tablet TAKE 1/2 TABLET BY MOUTH DAILY.  15 tablet  4  . fish oil-omega-3 fatty acids 1000 MG capsule Take 1 g by mouth daily.        . Fluticasone-Salmeterol (ADVAIR) 250-50 MCG/DOSE AEPB Inhale 2 puffs into the lungs 2 (two) times daily. With spacer  60 each  0  .  furosemide (LASIX) 20 MG tablet Take 20 mg by mouth as needed. Swelling.      . metoprolol succinate (TOPROL-XL) 50 MG 24 hr tablet Take 75 mg by mouth 2 (two) times daily. Take with or immediately following a meal.      . nitroGLYCERIN (NITROSTAT) 0.4 MG SL tablet Place 1 tablet (0.4 mg total) under the tongue every 5 (five) minutes x 3 doses as needed for chest pain.  60 tablet  1  . pantoprazole (PROTONIX) 40 MG tablet Take 1 tablet (40 mg total) by mouth daily.  30 tablet  0  . rosuvastatin (CRESTOR) 20 MG tablet Take 1 tablet (20 mg total) by mouth at bedtime.  30 tablet  12  . sitaGLIPtan-metformin (JANUMET) 50-1000 MG per tablet Take 1 tablet by mouth 2 (two) times daily with a meal.  60 tablet  0  . warfarin (COUMADIN) 5 MG tablet Take 2.5-5 mg by mouth at bedtime.      . Blood Pressure Monitoring (BLOOD PRESSURE CUFF) MISC ONE BLOOD PRESSURE CUFF PER DR RR TO HAVE PT CHECK BLOOD PRESSURE DAILY IN HOME, USE AS DIRECTED, ONE UNIT NO REFILLS  1 each  0    Assessment: 62yo male with h/o afib on chronic Warfarin PTA.  INR is at baseline on admission.  Home dose  listed above.  Pt reports taking dose on 9/30.  Pt has been started on full dose Lovenox until INR is at goal.  Goal of Therapy:  INR 2-3 Monitor platelets by anticoagulation protocol: Yes   Plan:  Coumadin 7.5mg  po today x 1 (to boost INR) INR daily Continue full dose Lovenox until INR therapeutic  Margo Aye, Daneil Beem A 01/15/2013,10:10 AM

## 2013-01-15 NOTE — Progress Notes (Signed)
[facility] PROGRESS NOTE  [name] Cook ZOX:[id number] DOB: May 10, 1950 DOA: 01/15/2013 PCP: No primary provider on file. Cardiologist: [name], MD  Assessment/Plan: 1. Atrial fibrillation with rapid ventricular response: Converted to sinus rhythm. Continue Toprol and elevated dose. Followup cardiology recommendations. Do not think full dose anticoagulation indicated. History of persistent atrial fibrillation. Developed elevated liver function tests on amiodarone. Prior to this, he had been on dofetilide with no success in maintaining sinus rhythm. If the Multaq does not work, catheter ablation would also be a consideration as would AV node ablation and pacemaker insertion. 2. Chest discomfort: Likely secondary to elevated heart rate. No recurrence. Doubt ACS. Continue troponin. 3. Hypokalemia: Repleted. 4. History diastolic congestive heart failure: Compensated. Continue Lasix. 5. Diabetes mellitus type 2: Resume Janumet on discharge. Sliding-scale insulin. 6. Hypertension: Stable.   Followup cardiology recommendations, troponin  Warfarin per pharmacy  Pending studies:   None   Code Status: full code DVT prophylaxis: Lovenox, warfarin Family Communication: none present Disposition Plan: likely home later today  Brendia Sacks, MD  [facility]  Pager [phone] If 7PM-7AM, please contact night-coverage at [email or web address], password [facility] 01/15/2013, 7:46 AM  LOS: 0 days   Summary: 61 year old man with history of atrial fibrillation on warfarin presented to the emergency department with history of palpitations and chest discomfort as well as shortness of breath. Patient is followed by [facility] cardiology, and is on Toprol XL 75 mg by mouth twice a day for rate control. Earlier patient was on MULTAQ, and was taken off this medication due to nausea. He was started on IV Cardizem for atrial fibrillation with rapid ventricular response and admitted to stepdown  unit.  Consultants:  [facility] cardiology  Procedures:    HPI/Subjective: Feels much better. No chest pain. No complaints. Converted to sinus rhythm.  Objective: Filed Vitals:   01/15/13 0400 01/15/13 0418 01/15/13 0425 01/15/13 0728  BP: 101/70  103/81   Pulse: 65     Temp:  97.6 F (36.4 C)  97.5 F (36.4 C)  TempSrc:  Oral  Oral  Resp:   12   Height:  6' (1.829 m)    Weight:  104.9 kg (231 lb 4.2 oz)    SpO2: 98%       Intake/Output Summary (Last 24 hours) at 01/15/13 0746 Last data filed at 01/15/13 0500  Gross per 24 hour  Intake      0 ml  Output    300 ml  Net   -300 ml     Filed Weights   01/15/13 0418  Weight: 104.9 kg (231 lb 4.2 oz)    Exam:   Afebrile, vital signs stable.   General: Appears calm and comfortable.  Psychiatric: Grossly normal mood and affect. Speech fluent and appropriate.  Cardiovascular: Regular rate and rhythm. No murmur, rub, gallop. No lower extremity edema.  Respiratory: Clear to auscultation bilaterally. No wheezes, rales, rhonchi. Normal respiratory effort.  Data Reviewed:  Troponins negative  Scheduled Meds: . amLODipine  5 mg Oral Daily  . atorvastatin  40 mg Oral q1800  . enoxaparin (LOVENOX) injection  100 mg Subcutaneous Q12H  . insulin aspart  0-5 Units Subcutaneous QHS  . insulin aspart  0-9 Units Subcutaneous TID WC  . metoprolol succinate  75 mg Oral BID  . mometasone-formoterol  2 puff Inhalation BID  . pantoprazole  40 mg Oral Daily  . potassium chloride  10 mEq Intravenous Q1 Hr x 3  . sodium chloride  3 mL Intravenous Q12H  .  Warfarin - Pharmacist Dosing Inpatient   Does not apply Q24H   Continuous Infusions: . diltiazem (CARDIZEM) infusion Stopped (01/15/13 0507)    Active Problems:   Chronic anticoagulation   Diabetes mellitus, type 2   Atrial flutter   Hypokalemia

## 2013-01-15 NOTE — ED Notes (Signed)
Pt reports some sharp chest pains and a feeling of "fluttering" in his chest that he reports is consistent with when he goes into a-fib.

## 2013-01-15 NOTE — Discharge Summary (Signed)
Physician Discharge Summary  [name] Cook EAV:[id number] DOB: 12-Mar-1951 DOA: 01/15/2013  PCP: No primary provider on file.  Admit date: 01/15/2013 Discharge date: 01/15/2013  Recommendations for Outpatient Follow-up:  1. Followup atrial fibrillation, consideration of ablation. 2. Followup PT/INR on warfarin for atrial fibrillation   Lab Results  Component Value Date   INR 1.15 01/15/2013   INR 2.4 12/25/2012   INR 2.6 11/18/2012    Follow-up Information   Follow up with [name] Manges, Cook On 01/20/2013. (Be there at 1:30pm to talk with [name] Cook about ablation)    Specialty:  Cook   Contact information:   [address] [place] [address] [phone]      Discharge Diagnoses:  1. Atrial fibrillation with rapid ventricular response 2. Chest discomfort 3. Diabetes mellitus type 2  Discharge Condition: Improved Disposition: Home  Diet recommendation: Heart healthy diabetic diet  Filed Weights   01/15/13 0418  Weight: 104.9 kg (231 [name] 4.2 oz)    History of present illness:  62 year old man with history of atrial fibrillation on warfarin presented to the emergency department with history of palpitations and chest discomfort as well as shortness of breath. Patient is followed by [facility], and is on Toprol XL 75 mg by mouth twice a day for rate control. Earlier patient was on MULTAQ, and was taken off this medication due to nausea. He was started on IV Cardizem for atrial fibrillation with rapid ventricular response and admitted to stepdown unit.  Hospital Course:  [name] Cook converted to sinus rhythm with diltiazem IV. He has remained in sinus rhythm on usual dose of metoprolol. Chest discomfort resolved and was likely related to rapid rate. Troponin is negative. Discussed with [name] Cook discharge home today to outpatient followup with electrophysiology, increase Toprol-XL to 100 mg daily and discontinuing Norvasc. Patient has followup with  [facility] tomorrow.  1. Hypokalemia: Repleted. 2. History diastolic congestive heart failure: Compensated. Continue Lasix. 3. Diabetes mellitus type 2: Resume Janumet on discharge. Sliding-scale insulin. 4. Hypertension: Stable.  Consultants:  [name] Cook   Procedures:   None Discharge Instructions  Discharge Orders   Future Appointments Provider Department Dept Phone   01/16/2013 11:30 AM [name] Cook [name] Cook [place] [phone]   01/27/2013 4:15 PM [name] Nurse [name] Cook [place] [phone]   Patient should bring all current BP medications.   03/24/2013 8:30 AM [name] Cook [facility] Cook [place] [phone]   Future Orders Complete By Expires   Activity as tolerated - No restrictions  As directed    Diet - low sodium heart healthy  As directed    Diet Carb Modified  As directed    Discharge instructions  As directed    Comments:     Call physician or seek immediate medical attention for rapid heart rate, chest pain or worsening of condition. The cardiologist has recommended increasing your dose of Toprol-XL to 100 mg per day. Stop Norvasc. Take 7.5 mg of warfarin tonight. Resume usual dosing of warfarin 10/2.       Medication List    STOP taking these medications       amLODipine 5 MG tablet  Commonly known as:  NORVASC      TAKE these medications       Blood Pressure Cuff Misc  ONE BLOOD PRESSURE CUFF PER DR RR TO HAVE PT CHECK BLOOD PRESSURE DAILY IN HOME, USE AS DIRECTED, ONE UNIT NO REFILLS     celecoxib 200 MG capsule  Commonly  known as:  CELEBREX  Take 1 capsule (200 mg total) by mouth daily.     chlorthalidone 25 MG tablet  Commonly known as:  HYGROTON  TAKE 1/2 TABLET BY MOUTH DAILY.     fish oil-omega-3 fatty acids 1000 MG capsule  Take 1 g by mouth daily.     Fluticasone-Salmeterol 250-50 MCG/DOSE Aepb  Commonly known as:  ADVAIR  Inhale 2 puffs into the lungs 2 (two) times daily. With spacer      furosemide 20 MG tablet  Commonly known as:  LASIX  Take 20 mg by mouth as needed. Swelling.     metoprolol succinate 50 MG 24 hr tablet  Commonly known as:  TOPROL-XL  Take 2 tablets (100 mg total) by mouth 2 (two) times daily. Take with or immediately following a meal.     nitroGLYCERIN 0.4 MG SL tablet  Commonly known as:  NITROSTAT  Place 1 tablet (0.4 mg total) under the tongue every 5 (five) minutes x 3 doses as needed for chest pain.     pantoprazole 40 MG tablet  Commonly known as:  PROTONIX  Take 1 tablet (40 mg total) by mouth daily.     rosuvastatin 20 MG tablet  Commonly known as:  CRESTOR  Take 1 tablet (20 mg total) by mouth at bedtime.     sitaGLIPtin-metformin 50-1000 MG per tablet  Commonly known as:  JANUMET  Take 1 tablet by mouth 2 (two) times daily with a meal.     warfarin 5 MG tablet  Commonly known as:  Cook  Take 2.5-5 mg by mouth at bedtime.       No Known Allergies  The results of significant diagnostics from this hospitalization (including imaging, microbiology, ancillary and laboratory) are listed below for reference.    Significant Diagnostic Studies: Dg Chest Portable 1 View  01/15/2013   CLINICAL DATA:  Shortness of breath  EXAM: PORTABLE CHEST - 1 VIEW  COMPARISON:  10/30/2011  FINDINGS: Mild cardiomegaly and peribronchial thickening. No confluent opacities. No effusions. No acute bony abnormality.  IMPRESSION: Mild cardiomegaly and bronchitic changes.   Electronically Signed   By: Charlett Nose M.D.   On: 01/15/2013 02:46    Microbiology: Recent Results (from the past 240 hour(s))  MRSA PCR SCREENING     Status: None   Collection Time    01/15/13  4:12 AM      Result Value Range Status   MRSA by PCR NEGATIVE  NEGATIVE Final   Comment:            The GeneXpert MRSA Assay (FDA     approved for NASAL specimens     only), is one component of a     comprehensive MRSA colonization     surveillance program. It is not     intended to  diagnose MRSA     infection nor to guide or     monitor treatment for     MRSA infections.     Labs: Basic Metabolic Panel:  Recent Labs Lab 01/15/13 0230  NA 139  K 3.2*  CL 99  CO2 27  GLUCOSE 160*  BUN 24*  CREATININE 1.17  CALCIUM 9.9   CBC:  Recent Labs Lab 01/15/13 0230  WBC 8.6  NEUTROABS 3.9  HGB 14.2  HCT 41.1  MCV 92.2  PLT 174   Cardiac Enzymes:  Recent Labs Lab 01/15/13 0230 01/15/13 0429 01/15/13 1023  TROPONINI <0.30 <0.30 <0.30   CBG:  Recent  Labs Lab 01/15/13 0721 01/15/13 1116  GLUCAP 101* 137*    Principal Problem:   Atrial fibrillation with rapid ventricular response Active Problems:   Chronic anticoagulation   Diabetes mellitus, type 2   Atrial flutter   Hypokalemia   Time coordinating discharge: 25 minutes  Signed:  Brendia Sacks, Cook [facility] 01/15/2013, 4:20 PM

## 2013-01-15 NOTE — ED Provider Notes (Signed)
CSN: [id number]     Arrival date & time 01/15/13  0213 History   First MD Initiated Contact with Patient 01/15/13 0227     Chief Complaint  Patient presents with  . Chest Pain  . Heart fluttering    (Consider location/radiation/quality/duration/timing/severity/associated sxs/prior Treatment) Patient is a 62 y.o. male presenting with palpitations. The history is provided by the patient (the pt states his heart starting beating fast tonight).  Palpitations Palpitations quality:  Irregular Onset quality:  Sudden Timing:  Constant Progression:  Unchanged Chronicity:  Recurrent Context: not anxiety   Associated symptoms: no back pain, no chest pain and no cough     Past Medical History  Diagnosis Date  . Persistent atrial fibrillation     onset in 2009; cardiac cath in 2001-50% circumflex; recurred 04/2010, 03/2011; ventricular rate controlled; normal coronary angiography in 2001; tachycardia mediated cardiomyopathy;  Tikosyn initiated 05/2011  . Hypertension     with hypertensive heart disease  . Hyperlipidemia     04/2008:220, 289, 43, 119  . Atrial flutter     radiofrequency ablation in 2002  . Obesity   . Chronic anticoagulation   . Tobacco abuse   . Chronic obstructive pulmonary disease 04/20/2011  . GERD (gastroesophageal reflux disease)   . Arthritis   . Diabetes mellitus, type 2     No insulin   Past Surgical History  Procedure Laterality Date  . Atrial ablation surgery  2002    atrial flutter; subsequently developed atrial fibrillation  . Implant-ilr      Subsequently removed  . Cardioversion  05/31/2011    Procedure: CARDIOVERSION;  Surgeon: [name]. [name], MD;  Location: [facility];  Service: Cardiovascular;  Laterality: N/A;   Family History  Problem Relation Age of Onset  . Alzheimer's disease Mother   . Osteoporosis Mother    History  Substance Use Topics  . Smoking status: Former Smoker -- 0.50 packs/day for 42 years    Types: Cigarettes    Quit date:  12/31/2011  . Smokeless tobacco: Never Used     Comment: he is trying to quit  . Alcohol Use: No    Review of Systems  Constitutional: Negative for appetite change and fatigue.  HENT: Negative for congestion, sinus pressure and ear discharge.   Eyes: Negative for discharge.  Respiratory: Negative for cough.   Cardiovascular: Positive for palpitations. Negative for chest pain.  Gastrointestinal: Negative for abdominal pain and diarrhea.  Genitourinary: Negative for frequency and hematuria.  Musculoskeletal: Negative for back pain.  Skin: Negative for rash.  Neurological: Negative for seizures and headaches.  Psychiatric/Behavioral: Negative for hallucinations.    Allergies  Review of patient's allergies indicates no known allergies.  Home Medications   Current Outpatient Rx  Name  Route  Sig  Dispense  Refill  . amLODipine (NORVASC) 5 MG tablet   Oral   Take 1 tablet (5 mg total) by mouth daily.   30 tablet   11   . celecoxib (CELEBREX) 200 MG capsule   Oral   Take 1 capsule (200 mg total) by mouth daily.   30 capsule   0     PCP to provide further refills   . chlorthalidone (HYGROTON) 25 MG tablet      TAKE 1/2 TABLET BY MOUTH DAILY.   15 tablet   4   . fish oil-omega-3 fatty acids 1000 MG capsule   Oral   Take 1 g by mouth daily.           Marland Kitchen  Fluticasone-Salmeterol (ADVAIR) 250-50 MCG/DOSE AEPB   Inhalation   Inhale 2 puffs into the lungs 2 (two) times daily. With spacer   60 each   0     PCP to provide further refills   . furosemide (LASIX) 20 MG tablet   Oral   Take 20 mg by mouth as needed. Swelling.         . metoprolol succinate (TOPROL-XL) 50 MG 24 hr tablet   Oral   Take 75 mg by mouth 2 (two) times daily. Take with or immediately following a meal.         . nitroGLYCERIN (NITROSTAT) 0.4 MG SL tablet   Sublingual   Place 1 tablet (0.4 mg total) under the tongue every 5 (five) minutes x 3 doses as needed for chest pain.   60 tablet    1   . pantoprazole (PROTONIX) 40 MG tablet   Oral   Take 1 tablet (40 mg total) by mouth daily.   30 tablet   0     PCP to provide further refills   . rosuvastatin (CRESTOR) 20 MG tablet   Oral   Take 1 tablet (20 mg total) by mouth at bedtime.   30 tablet   12   . sitaGLIPtan-metformin (JANUMET) 50-1000 MG per tablet   Oral   Take 1 tablet by mouth 2 (two) times daily with a meal.   60 tablet   0     PCP to provide authorization for further refills   . warfarin (COUMADIN) 2.5 MG tablet   Oral   Take 1-2 tablets (2.5-5 mg total) by mouth daily. Takes 2 tablets on Fridays. 1 tablet every day.   45 tablet   3   . Blood Pressure Monitoring (BLOOD PRESSURE CUFF) MISC      ONE BLOOD PRESSURE CUFF PER DR RR TO HAVE PT CHECK BLOOD PRESSURE DAILY IN HOME, USE AS DIRECTED, ONE UNIT NO REFILLS   1 each   0   . dronedarone (MULTAQ) 400 MG tablet   Oral   Take 1 tablet (400 mg total) by mouth 2 (two) times daily with a meal.   60 tablet   6    BP 114/81  Pulse 79  Temp(Src) 97.2 F (36.2 C)  Resp 14  SpO2 97% Physical Exam  Constitutional: He is oriented to person, place, and time. He appears well-developed.  HENT:  Head: Normocephalic.  Eyes: Conjunctivae and EOM are normal. No scleral icterus.  Neck: Neck supple. No thyromegaly present.  Cardiovascular: Normal rate.  Exam reveals no gallop and no friction rub.   No murmur heard. irregular rapid rate  Pulmonary/Chest: No stridor. He has no wheezes. He has no rales. He exhibits no tenderness.  Abdominal: He exhibits no distension. There is no tenderness. There is no rebound.  Musculoskeletal: Normal range of motion. He exhibits no edema.  Lymphadenopathy:    He has no cervical adenopathy.  Neurological: He is oriented to person, place, and time. He exhibits normal muscle tone. Coordination normal.  Skin: No rash noted. No erythema.  Psychiatric: He has a normal mood and affect. His behavior is normal.    ED  Course  Procedures (including critical care time) Labs Review Labs Reviewed  BASIC METABOLIC PANEL - Abnormal; Notable for the following:    Potassium 3.2 (*)    Glucose, Bld 160 (*)    BUN 24 (*)    GFR calc non Af Amer 66 (*)    GFR  calc Af Amer 77 (*)    All other components within normal limits  CBC WITH DIFFERENTIAL  PROTIME-INR  TROPONIN I  POCT I-STAT TROPONIN I   Imaging Review Dg Chest Portable 1 View  01/15/2013   CLINICAL DATA:  Shortness of breath  EXAM: PORTABLE CHEST - 1 VIEW  COMPARISON:  10/30/2011  FINDINGS: Mild cardiomegaly and peribronchial thickening. No confluent opacities. No effusions. No acute bony abnormality.  IMPRESSION: Mild cardiomegaly and bronchitic changes.   Electronically Signed   By: Charlett Nose M.D.   On: 01/15/2013 02:46  CRITICAL CARE Performed by: Josehua Hammar L Total critical care time:40 Critical care time was exclusive of separately billable procedures and treating other patients. Critical care was necessary to treat or prevent imminent or life-threatening deterioration. Critical care was time spent personally by me on the following activities: development of treatment plan with patient and/or surrogate as well as nursing, discussions with consultants, evaluation of patient's response to treatment, examination of patient, obtaining history from patient or surrogate, ordering and performing treatments and interventions, ordering and review of laboratory studies, ordering and review of radiographic studies, pulse oximetry and re-evaluation of patient's condition.  Date: 01/15/2013  Rate: 142  Rhythm: atrial fibrillation  QRS Axis: normal  Intervals: normal  ST/T Wave abnormalities: nonspecific ST changes  Conduction Disutrbances:none  Narrative Interpretation:   Old EKG Reviewed: changes noted    MDM   1. Atrial fibrillation        Benny Lennert, MD 01/15/13 [phone]

## 2013-01-15 NOTE — Consult Note (Signed)
CARDIOLOGY CONSULT NOTE   Patient ID: [name] Cook MRN: [id number] DOB/AGE: [birth year] 62 y.o.  Admit Date: 01/15/2013 Referring Physician: PTH Primary Physician: No primary provider on file. Consulting Cardiologist: Wyline Mood, MD Primary Cardiologist: Wilbern Pennypacker (Formerly [name]) EP MD: [name] MD Reason for Consultation: Afib with RVR  Clinical Summary [name] Cook is a 62 y.o.male admitted with atrial fib RVR, with known history of atrial fib on coumadin, hypertension, hyperlipidemia, COPD, OSA, and diabetes. He is followed by Dr. Ladona Ridgel and was seen on 12/30/2012 and was in NSR. Dr. Lubertha Basque note stated, that amiodarone was discontinued approximately 6 months ago, he becomes  sick in the past when he goes into atrial fibrillation. He recommended that he start taking Multaq 400 mg twice daily with food. He was unable to tolerate Multaq due to NV and dizziness. Toprol was increased to 50 mg 1 and 1/2 tablet BID.  Catheter ablation would also be a consideration as would AV node ablation and pacemaker insertion.     As usual state of health when he began to feel his heart rate go up, with associated chest discomfort, and shortness of breath. He denied any nausea vomiting or diaphoresis. He presented to the emergency room, where he was found to be in atrial fibrillation with RVR rate of 142 beats per minute. The patient states that Dr. Ladona Ridgel, warned him about possibility of return of atrial fibrillation once amiodarone had gotten out of his system. Blood pressure is 130/79, O2 sat 88%. He was found to be hypokalemic with potassium of 3.2, glucose of 160, BUN 24 with creatinine 1.17. Troponin was negative x1. INR 1.15. He was started on diltiazem drip after 10 mg bolus. Also provide 40 mEq of potassium PO, and given 5 mg of metoprolol IV x1. He subsequently converted to normal sinus rhythm at 5 AM this morning, and Cardizem gtt was discontinued. The patient states chest discomfort went away and he felt  more relaxed.         No Known Allergies  Medications Scheduled Medications: . amLODipine  5 mg Oral Daily  . atorvastatin  40 mg Oral q1800  . enoxaparin (LOVENOX) injection  100 mg Subcutaneous Q12H  . insulin aspart  0-5 Units Subcutaneous QHS  . insulin aspart  0-9 Units Subcutaneous TID WC  . metoprolol succinate  75 mg Oral BID  . mometasone-formoterol  2 puff Inhalation BID  . pantoprazole  40 mg Oral Daily  . potassium chloride  10 mEq Intravenous Q1 Hr x 3  . sodium chloride  3 mL Intravenous Q12H  . Warfarin - Pharmacist Dosing Inpatient   Does not apply Q24H     Infusions: . diltiazem (CARDIZEM) infusion Stopped (01/15/13 0507)     PRN Medications:  sodium chloride, acetaminophen, acetaminophen, sodium chloride   Past Medical History  Diagnosis Date  . Persistent atrial fibrillation     onset in 2009; cardiac cath in 2001-50% circumflex; recurred 04/2010, 03/2011; ventricular rate controlled; normal coronary angiography in 2001; tachycardia mediated cardiomyopathy;  Tikosyn initiated 05/2011  . Hypertension     with hypertensive heart disease  . Hyperlipidemia     04/2008:220, 289, 43, 119  . Atrial flutter     radiofrequency ablation in 2002  . Obesity   . Chronic anticoagulation   . Tobacco abuse   . Chronic obstructive pulmonary disease 04/20/2011  . GERD (gastroesophageal reflux disease)   . Arthritis   . Diabetes mellitus, type 2     No insulin  Past Surgical History  Procedure Laterality Date  . Atrial ablation surgery  2002    atrial flutter; subsequently developed atrial fibrillation  . Implant-ilr      Subsequently removed  . Cardioversion  05/31/2011    Procedure: CARDIOVERSION;  Surgeon: [name]. [name], MD;  Location: [facility];  Service: Cardiovascular;  Laterality: N/A;    Family History  Problem Relation Age of Onset  . Alzheimer's disease Mother   . Osteoporosis Mother     Social History [name] Cook reports that he quit  smoking about 12 months ago. His smoking use included Cigarettes. He has a 21 pack-year smoking history. He has never used smokeless tobacco. [name] Cook reports that he does not drink alcohol.  Review of Systems Otherwise reviewed and negative except as outlined.  Physical Examination Blood pressure 103/81, pulse 65, temperature 97.5 F (36.4 C), temperature source Oral, resp. rate 12, height 6' (1.829 m), weight 231 lb 4.2 oz (104.9 kg), SpO2 98.00%.  Intake/Output Summary (Last 24 hours) at 01/15/13 0758 Last data filed at 01/15/13 0500  Gross per 24 hour  Intake      0 ml  Output    300 ml  Net   -300 ml    Telemetry:NSR rates in the 60'-70's  HEENT: Conjunctiva and lids normal, oropharynx clear with moist mucosa. Neck: Supple, no elevated JVP or carotid bruits, no thyromegaly. Lungs: Clear to auscultation, occasional inspiratory wheezes, cleared with cough. Cardiac: Regular rate and rhythm, no S3 or significant systolic murmur, no pericardial rub. Abdomen: Soft, nontender, no hepatomegaly, bowel sounds present, no guarding or rebound. Extremities: No pitting edema, distal pulses 2+. Skin: Warm and dry. Musculoskeletal: No kyphosis. Neuropsychiatric: Alert and oriented x3, affect grossly appropriate.  Prior Cardiac Testing/Procedures  1. Echocardiogram: 05/12/2011 Left ventricle: The cavity size was normal. Wall thickness was increased in a pattern of mild LVH, most prominent in the septum. Systolic function was normal. The estimated ejection fraction was in the range of 55% to 60%. Wall motion was normal; there were no regional wall motion abnormalities. - Aortic valve: Mildly calcified annulus. Trileaflet. - Mitral valve: Calcified annulus. - Left atrium: The atrium was mildly dilated. - Right ventricle: The cavity size was normal. Wall thickness was mildly increased. - Pulmonary arteries: PA peak pressure: 35mm Hg (S).  2. Nuclear Stress Test 12/22/2011  Negative  pharmacologic stress nuclear myocardial study revealing normal left ventricular systolic function, no stress induced EKG abnormalities and diaphragmatic attenuation without evidence for ischemia or infarction. Other findings as noted.  3. Catheter Ablation 2001 RESULTS/CONCLUSION: This demonstrated successful catheter ablation of  typical atrial flutter with a total of 12 RF energy applications delivered  to the atrial flutter isthmus.   4, Cardiac Cath: Mild coronary artery disease, status post cardiac catheterization on  December 02, 1999, by Dr. Elsie Lincoln with normal left ventricle. The left main,  right coronary artery, and left anterior descending were all normal. He  had a 50% mid intermittent ramus.     Lab Results  Basic Metabolic Panel:  Recent Labs Lab 01/15/13 0230  NA 139  K 3.2*  CL 99  CO2 27  GLUCOSE 160*  BUN 24*  CREATININE 1.17  CALCIUM 9.9     CBC:  Recent Labs Lab 01/15/13 0230  WBC 8.6  NEUTROABS 3.9  HGB 14.2  HCT 41.1  MCV 92.2  PLT 174    Cardiac Enzymes:  Recent Labs Lab 01/15/13 0230 01/15/13 0429  TROPONINI <0.30 <0.30  Radiology: Dg Chest Portable 1 View  01/15/2013   CLINICAL DATA:  Shortness of breath  EXAM: PORTABLE CHEST - 1 VIEW  COMPARISON:  10/30/2011  FINDINGS: Mild cardiomegaly and peribronchial thickening. No confluent opacities. No effusions. No acute bony abnormality.  IMPRESSION: Mild cardiomegaly and bronchitic changes.   Electronically Signed   By: Charlett Nose M.D.   On: 01/15/2013 02:46     ZOX:WRUEAV this am.    Impression and Recommendations:  1. Atrial fib with RVR: Now converted to NSR after IV diltiazem gtt and IV metoprolol. BP is low normal. I have called Dr. Ladona Ridgel to discuss with him the need to transfer patient for ablation procedure vs. ongoing outpatient medical management. Dr. Lubertha Basque is in a procedure, and I am awaiting a call back. For now will continue patient on metoprolol 75 mg twice  a day. We will decrease amlodipine to 2.5 mg in the setting of hypotension. INR was low this a.m. 1.15. If patient will need to have ablation, we will need to hold off on Coumadin. We will await phone call back from Dr. Ladona Ridgel concerning timing.   2. Hypokalemia: Now being repleted.   3. Hx of Diastolic CHF: No evidence of CHF or decompensation concerning breathing or edema. He is not currently on diuretic.  4. COPD: He is not on steroids or inhalers at present. Defer to PTH for management if necessary.      Signed: Bettey Mare. Lyman Bishop NP [facility] 01/15/2013, 7:58 AM Co-Sign MD  Attending note Case seen and discussed with NP Lyman Bishop. Patient is a 62 yo male with hx of parox afib, HTN, admitted with afib with RVR. Patient is followed by Dr Ladona Ridgel and has been tried on several antiarrythmics in the past including amiodarone, dronederone, and dofetilide with varying side effects and reasons for discontinuation. He has been most recently managed with a rate control strategy with Toprol XL, with discussions for possible ablation procedure. He has converted to NSR with rates in 60s with normal blood pressure after being on dilt drip overnight. We have contacted Dr Ladona Ridgel to see since the patient's INR is subtherapeutic, is there interest in performing an ablation at this time. If not, we will titrate up his Toprol XL and likely add short acting diltiazem to his regimen. I have stopped his norvasc to allow [place] room with his blood pressure to do so.   Dina Rich MD

## 2013-01-15 NOTE — Progress Notes (Signed)
UR chart review completed.  

## 2013-01-15 NOTE — Progress Notes (Signed)
Patient ready for discharge home. Patient with follow up appointments in place.  No acute distress noted.  Out via wheelchair to transport home.

## 2013-01-15 NOTE — H&P (Signed)
PCP:   No primary provider on file.   Chief Complaint:  palpitations  HPI: 62 year old male with a history of atrial fibrillation, hypertension, hyperlipidemia on chronic anticoagulation with Coumadin drove himself to the ED after patient woke up from sleep with palpitations and chest discomfort. Patient also was mildly short of breath. Patient is followed by [facility] cardiology, and is on Toprol XL 75 mg by mouth twice a day for rate control. Earlier patient was on MULTAQ, and was taken off this medication due to nausea. He denies fever no nausea vomiting or diarrhea, no dysuria urgency frequency of urination. Patient was started on IV Cardizem drip in the ED at this time the heart rate is in 120s to 130s, patient denies chest pain at this time.  Allergies:  No Known Allergies    Past Medical History  Diagnosis Date  . Persistent atrial fibrillation     onset in 2009; cardiac cath in 2001-50% circumflex; recurred 04/2010, 03/2011; ventricular rate controlled; normal coronary angiography in 2001; tachycardia mediated cardiomyopathy;  Tikosyn initiated 05/2011  . Hypertension     with hypertensive heart disease  . Hyperlipidemia     04/2008:220, 289, 43, 119  . Atrial flutter     radiofrequency ablation in 2002  . Obesity   . Chronic anticoagulation   . Tobacco abuse   . Chronic obstructive pulmonary disease 04/20/2011  . GERD (gastroesophageal reflux disease)   . Arthritis   . Diabetes mellitus, type 2     No insulin    Past Surgical History  Procedure Laterality Date  . Atrial ablation surgery  2002    atrial flutter; subsequently developed atrial fibrillation  . Implant-ilr      Subsequently removed  . Cardioversion  05/31/2011    Procedure: CARDIOVERSION;  Surgeon: [name]. [name], MD;  Location: [facility];  Service: Cardiovascular;  Laterality: N/A;    Prior to Admission medications   Medication Sig Start Date End Date Taking? Authorizing Provider  amLODipine (NORVASC) 5 MG  tablet Take 1 tablet (5 mg total) by mouth daily. 08/30/12  Yes Kathlen Brunswick, MD  celecoxib (CELEBREX) 200 MG capsule Take 1 capsule (200 mg total) by mouth daily. 06/20/11  Yes Kathlen Brunswick, MD  chlorthalidone (HYGROTON) 25 MG tablet TAKE 1/2 TABLET BY MOUTH DAILY. 12/19/12  Yes Kathlen Brunswick, MD  fish oil-omega-3 fatty acids 1000 MG capsule Take 1 g by mouth daily.     Yes Historical Provider, MD  Fluticasone-Salmeterol (ADVAIR) 250-50 MCG/DOSE AEPB Inhale 2 puffs into the lungs 2 (two) times daily. With spacer 06/20/11  Yes Kathlen Brunswick, MD  furosemide (LASIX) 20 MG tablet Take 20 mg by mouth as needed. Swelling. 05/05/11  Yes Kathlen Brunswick, MD  metoprolol succinate (TOPROL-XL) 50 MG 24 hr tablet Take 75 mg by mouth 2 (two) times daily. Take with or immediately following a meal. 06/17/12  Yes Marinus Maw, MD  nitroGLYCERIN (NITROSTAT) 0.4 MG SL tablet Place 1 tablet (0.4 mg total) under the tongue every 5 (five) minutes x 3 doses as needed for chest pain. 06/17/12 06/26/13 Yes Marinus Maw, MD  pantoprazole (PROTONIX) 40 MG tablet Take 1 tablet (40 mg total) by mouth daily. 06/20/11  Yes Kathlen Brunswick, MD  rosuvastatin (CRESTOR) 20 MG tablet Take 1 tablet (20 mg total) by mouth at bedtime. 06/19/11  Yes Jodelle Gross, NP  sitaGLIPtan-metformin (JANUMET) 50-1000 MG per tablet Take 1 tablet by mouth 2 (two) times daily with  a meal. 08/07/11  Yes Kathlen Brunswick, MD  warfarin (COUMADIN) 2.5 MG tablet Take 1-2 tablets (2.5-5 mg total) by mouth daily. Takes 2 tablets on Fridays. 1 tablet every day. 10/30/12  Yes Kathlen Brunswick, MD  Blood Pressure Monitoring (BLOOD PRESSURE CUFF) MISC ONE BLOOD PRESSURE CUFF PER DR RR TO HAVE PT CHECK BLOOD PRESSURE DAILY IN HOME, USE AS DIRECTED, ONE UNIT NO REFILLS 08/30/12   Kathlen Brunswick, MD  dronedarone (MULTAQ) 400 MG tablet Take 1 tablet (400 mg total) by mouth 2 (two) times daily with a meal. 12/25/12   Marinus Maw, MD    Social  History:  reports that he quit smoking about 12 months ago. His smoking use included Cigarettes. He has a 21 pack-year smoking history. He has never used smokeless tobacco. He reports that he does not drink alcohol or use illicit drugs.  Family History  Problem Relation Age of Onset  . Alzheimer's disease Mother   . Osteoporosis Mother      All the positives are listed in BOLD  Review of Systems:  HEENT: Headache, blurred vision, runny nose, sore throat Neck: Hypothyroidism, hyperthyroidism,,lymphadenopathy Chest : Shortness of breath, history of COPD, Asthma Heart : Chest pain, history of coronary arterey disease,palpitations GI:  Nausea, vomiting, diarrhea, constipation, GERD GU: Dysuria, urgency, frequency of urination, hematuria Neuro: Stroke, seizures, syncope Psych: Depression, anxiety, hallucinations   Physical Exam: Blood pressure 114/81, pulse 79, temperature 97.2 F (36.2 C), resp. rate 14, SpO2 97.00%. Constitutional:   Patient is a well-developed and well-nourished male* in no acute distress and cooperative with exam. Head: Normocephalic and atraumatic Mouth: Mucus membranes moist Eyes: PERRL, EOMI, conjunctivae normal Neck: Supple, No Thyromegaly Cardiovascular: irregular rhythm, no murmurs auscultated Pulmonary/Chest: CTAB, no wheezes, rales, or rhonchi Abdominal: Soft. Non-tender, non-distended, bowel sounds are normal, no masses, organomegaly, or guarding present.  Neurological: A&O x3, Strenght is normal and symmetric bilaterally, cranial nerve II-XII are grossly intact, no focal motor deficit, sensory intact to light touch bilaterally.  Extremities : No Cyanosis, Clubbing or Edema   Labs on Admission:  Results for orders placed during the hospital encounter of 01/15/13 (from the past 48 hour(s))  CBC WITH DIFFERENTIAL     Status: None   Collection Time    01/15/13  2:30 AM      Result Value Range   WBC 8.6  4.0 - 10.5 K/uL   RBC 4.46  4.22 - 5.81 MIL/uL    Hemoglobin 14.2  13.0 - 17.0 g/dL   HCT 09.8  11.9 - 14.7 %   MCV 92.2  78.0 - 100.0 fL   MCH 31.8  26.0 - 34.0 pg   MCHC 34.5  30.0 - 36.0 g/dL   RDW 82.9  56.2 - 13.0 %   Platelets 174  150 - 400 K/uL   Neutrophils Relative % 45  43 - 77 %   Neutro Abs 3.9  1.7 - 7.7 K/uL   Lymphocytes Relative 42  12 - 46 %   Lymphs Abs 3.6  0.7 - 4.0 K/uL   Monocytes Relative 7  3 - 12 %   Monocytes Absolute 0.6  0.1 - 1.0 K/uL   Eosinophils Relative 5  0 - 5 %   Eosinophils Absolute 0.4  0.0 - 0.7 K/uL   Basophils Relative 1  0 - 1 %   Basophils Absolute 0.1  0.0 - 0.1 K/uL  BASIC METABOLIC PANEL     Status: Abnormal   Collection  Time    01/15/13  2:30 AM      Result Value Range   Sodium 139  135 - 145 mEq/L   Potassium 3.2 (*) 3.5 - 5.1 mEq/L   Chloride 99  96 - 112 mEq/L   CO2 27  19 - 32 mEq/L   Glucose, Bld 160 (*) 70 - 99 mg/dL   BUN 24 (*) 6 - 23 mg/dL   Creatinine, Ser 8.65  0.50 - 1.35 mg/dL   Calcium 9.9  8.4 - 78.4 mg/dL   GFR calc non Af Amer 66 (*) >90 mL/min   GFR calc Af Amer 77 (*) >90 mL/min   Comment: (NOTE)     The eGFR has been calculated using the CKD EPI equation.     This calculation has not been validated in all clinical situations.     eGFR's persistently <90 mL/min signify possible Chronic Kidney     Disease.  PROTIME-INR     Status: None   Collection Time    01/15/13  2:30 AM      Result Value Range   Prothrombin Time 14.5  11.6 - 15.2 seconds   INR 1.15  0.00 - 1.49  TROPONIN I     Status: None   Collection Time    01/15/13  2:30 AM      Result Value Range   Troponin I <0.30  <0.30 ng/mL   Comment:            Due to the release kinetics of cTnI,     a negative result within the first hours     of the onset of symptoms does not rule out     myocardial infarction with certainty.     If myocardial infarction is still suspected,     repeat the test at appropriate intervals.  POCT I-STAT TROPONIN I     Status: None   Collection Time    01/15/13   2:30 AM      Result Value Range   Troponin i, poc 0.07  0.00 - 0.08 ng/mL   Comment 3            Comment: Due to the release kinetics of cTnI,     a negative result within the first hours     of the onset of symptoms does not rule out     myocardial infarction with certainty.     If myocardial infarction is still suspected,     repeat the test at appropriate intervals.    Radiological Exams on Admission: Dg Chest Portable 1 View  01/15/2013   CLINICAL DATA:  Shortness of breath  EXAM: PORTABLE CHEST - 1 VIEW  COMPARISON:  10/30/2011  FINDINGS: Mild cardiomegaly and peribronchial thickening. No confluent opacities. No effusions. No acute bony abnormality.  IMPRESSION: Mild cardiomegaly and bronchitic changes.   Electronically Signed   By: Charlett Nose M.D.   On: 01/15/2013 02:46    Assessment/Plan Active Problems:   Chronic anticoagulation   Diabetes mellitus, type 2   Atrial flutter   Hypokalemia  Atrial fibrillation with RVR Patient will be continued on Cardizem drip, will admit to step down unit INR is subtherapeutic at 1.15, so start Lovenox full dose anticoagulation. Warfarin to be dosed per pharmacy  Hypokalemia Replace potassium and check BMP in the morning  Chest pain Resolved at this time, will obtain 3 sets of cardiac enzymes First set of cardiac enzymes is negative  Diabetes mellitus Will hold the Januvia/metformin at  this time Start sliding scale insulin  DVT prophylaxis On full dose anticoagulation  Code status:full code  Family discussion:no family at bedside   Time Spent on Admission: 60 min  Terius Jacuinde S [facility] Pager: [phone] 01/15/2013, 4:07 AM  If 7PM-7AM, please contact night-coverage  [email or web address]  Password [email or web address]

## 2013-01-15 NOTE — Care Management Note (Signed)
    Page 1 of 1   01/15/2013     11:28:02 AM   CARE MANAGEMENT NOTE 01/15/2013  Patient:  [name] Cook, [name] Cook   Account Number:  [id number]  Date Initiated:  01/15/2013  Documentation initiated by:  Sharrie Rothman  Subjective/Objective Assessment:   Pt admitted from home with a fib. Pt lives alone and will return home at discharge. Pt is independent with ADL's.     Action/Plan:   Pt stated that he has a PCP provided by his insurance company listed on his insurance card but has not seen the MD in the office as of yet. Pt also stated that he has frequent follow ups with his cardiologist and does not want CM to make   Anticipated DC Date:  01/16/2013   Anticipated DC Plan:  HOME/SELF CARE      DC Planning Services  CM consult      Choice offered to / List presented to:             Status of service:  Completed, signed off Medicare Important Message given?   (If response is "NO", the following Medicare IM given date fields will be blank) Date Medicare IM given:   Date Additional Medicare IM given:    Discharge Disposition:  HOME/SELF CARE  Per UR Regulation:    If discussed at Long Length of Stay Meetings, dates discussed:    Comments:  01/15/13 1126 Arlyss Queen, RN BSN CM any PCP appts for him.

## 2013-01-15 NOTE — Progress Notes (Signed)
Nutrition Brief Note  Patient identified on the Malnutrition Screening Tool (MST) Report, with a score of 2.   Wt Readings from Last 15 Encounters:  01/15/13 231 lb 4.2 oz (104.9 kg)  12/25/12 228 lb (103.42 kg)  09/26/12 233 lb (105.688 kg)  08/29/12 235 lb (106.595 kg)  06/17/12 232 lb 6.4 oz (105.416 kg)  03/05/12 240 lb (108.863 kg)  01/01/12 234 lb (106.142 kg)  12/19/11 233 lb 4 oz (105.802 kg)  11/30/11 239 lb 12.8 oz (108.773 kg)  08/29/11 234 lb (106.142 kg)  08/11/11 233 lb (105.688 kg)  07/17/11 231 lb (104.781 kg)  07/04/11 230 lb (104.327 kg)  06/20/11 230 lb (104.327 kg)  06/15/11 228 lb (103.42 kg)    Body mass index is 31.36 kg/(m^2). Patient meets criteria for obesity, class I based on current BMI.   Current diet order is Heart Healthy, patient is consuming approximately n/a% of meals at this time. Labs and medications reviewed.   No nutrition interventions warranted at this time. If nutrition issues arise, please consult RD.   Ingeborg Fite A. Mayford Knife, RD, LDN Pager: [phone]

## 2013-01-16 ENCOUNTER — Ambulatory Visit (INDEPENDENT_AMBULATORY_CARE_PROVIDER_SITE_OTHER): Payer: Medicare HMO | Admitting: *Deleted

## 2013-01-16 DIAGNOSIS — I4819 Other persistent atrial fibrillation: Secondary | ICD-10-CM

## 2013-01-16 DIAGNOSIS — Z7901 Long term (current) use of anticoagulants: Secondary | ICD-10-CM

## 2013-01-16 DIAGNOSIS — I4891 Unspecified atrial fibrillation: Secondary | ICD-10-CM

## 2013-01-16 DIAGNOSIS — I4892 Unspecified atrial flutter: Secondary | ICD-10-CM

## 2013-01-16 LAB — POCT INR: INR: 1.9

## 2013-01-20 ENCOUNTER — Ambulatory Visit (INDEPENDENT_AMBULATORY_CARE_PROVIDER_SITE_OTHER): Payer: Medicare HMO | Admitting: Internal Medicine

## 2013-01-20 ENCOUNTER — Encounter: Payer: Self-pay | Admitting: Internal Medicine

## 2013-01-20 VITALS — BP 111/74 | HR 64 | Ht 72.0 in | Wt 228.0 lb

## 2013-01-20 DIAGNOSIS — I4819 Other persistent atrial fibrillation: Secondary | ICD-10-CM

## 2013-01-20 DIAGNOSIS — I4891 Unspecified atrial fibrillation: Secondary | ICD-10-CM

## 2013-01-20 NOTE — Assessment & Plan Note (Signed)
The patient has recurrent paroxysms of rapid atrial fibrillation. Has failed multiple medications. He is on my schedule I think it certainly. He needs to followup with Dr. Ladona Ridgel to make a decision as to whether to proceed with pulmonary vein isolation or AV junction ablation and pacing. For right now we will continue on his current doses of up titrate beta blockers.

## 2013-01-20 NOTE — Patient Instructions (Addendum)
Your physician recommends that you schedule a follow-up appointment with Dr. Ladona Ridgel

## 2013-01-20 NOTE — Progress Notes (Signed)
Patient Care Team: Kathlen Brunswick, MD (Cardiology)   HPI  [name] Cook is a 62 y.o. male Seen in followup for atrial fibrillation  He had been on amio but this was stopped secondary to liver function test other malaise   And multaq started with followup scheduled with Dr Ladona Ridgel.  Dronaderone was stopped secondary to nausea.   He has intercurrently been hospitalized again because of atrial fibrillation with a rapid rate. He usually he also been on dofetilide without long-term success   At the last office visit, AV ablation in atrial fibrillation ablation were both mentioned as considerations  LA dimension 45  1/13    Past Medical History  Diagnosis Date  . Persistent atrial fibrillation     onset in 2009; cardiac cath in 2001-50% circumflex; recurred 04/2010, 03/2011; ventricular rate controlled; normal coronary angiography in 2001; tachycardia mediated cardiomyopathy;  Tikosyn initiated 05/2011  . Hypertension     with hypertensive heart disease  . Hyperlipidemia     04/2008:220, 289, 43, 119  . Atrial flutter     radiofrequency ablation in 2002  . Obesity   . Chronic anticoagulation   . Tobacco abuse   . Chronic obstructive pulmonary disease 04/20/2011  . GERD (gastroesophageal reflux disease)   . Arthritis   . Diabetes mellitus, type 2     No insulin    Past Surgical History  Procedure Laterality Date  . Atrial ablation surgery  2002    atrial flutter; subsequently developed atrial fibrillation  . Implant-ilr      Subsequently removed  . Cardioversion  05/31/2011    Procedure: CARDIOVERSION;  Surgeon: [name]. [name], MD;  Location: [facility];  Service: Cardiovascular;  Laterality: N/A;    Current Outpatient Prescriptions  Medication Sig Dispense Refill  . Blood Pressure Monitoring (BLOOD PRESSURE CUFF) MISC ONE BLOOD PRESSURE CUFF PER DR RR TO HAVE PT CHECK BLOOD PRESSURE DAILY IN HOME, USE AS DIRECTED, ONE UNIT NO REFILLS  1 each  0  . celecoxib (CELEBREX) 200 MG  capsule Take 1 capsule (200 mg total) by mouth daily.  30 capsule  0  . chlorthalidone (HYGROTON) 25 MG tablet TAKE 1/2 TABLET BY MOUTH DAILY.  15 tablet  4  . fish oil-omega-3 fatty acids 1000 MG capsule Take 1 g by mouth daily.        . Fluticasone-Salmeterol (ADVAIR) 250-50 MCG/DOSE AEPB Inhale 2 puffs into the lungs 2 (two) times daily. With spacer  60 each  0  . furosemide (LASIX) 20 MG tablet Take 20 mg by mouth as needed. Swelling.      . metoprolol succinate (TOPROL-XL) 50 MG 24 hr tablet Take 2 tablets (100 mg total) by mouth 2 (two) times daily. Take with or immediately following a meal.      . nitroGLYCERIN (NITROSTAT) 0.4 MG SL tablet Place 1 tablet (0.4 mg total) under the tongue every 5 (five) minutes x 3 doses as needed for chest pain.  60 tablet  1  . pantoprazole (PROTONIX) 40 MG tablet Take 1 tablet (40 mg total) by mouth daily.  30 tablet  0  . rosuvastatin (CRESTOR) 20 MG tablet Take 1 tablet (20 mg total) by mouth at bedtime.  30 tablet  12  . sitaGLIPtan-metformin (JANUMET) 50-1000 MG per tablet Take 1 tablet by mouth 2 (two) times daily with a meal.  60 tablet  0  . warfarin (COUMADIN) 5 MG tablet Take 2.5-5 mg by mouth at bedtime.      . [  DISCONTINUED] lisinopril (PRINIVIL,ZESTRIL) 10 MG tablet Take 1 tablet (10 mg total) by mouth daily.  30 tablet  3   No current facility-administered medications for this visit.    No Known Allergies  Review of Systems negative except from HPI and PMH  Physical Exam BP 111/74  Pulse 64  Ht 6' (1.829 m)  Wt 228 lb (103.42 kg)  BMI 30.92 kg/m2 Well developed and well nourished in no acute distress HENT normal E scleral and icterus clear Neck Supple JVP flat; carotids brisk and full Clear to ausculation  Regular rate and rhythm, no murmurs gallops or rub Soft with active bowel sounds No clubbing cyanosis no  Edema Alert and oriented, grossly normal motor and sensory function Skin Warm and Dry  ECG today demonstrates sinus  rhythm at 64 Interval 17/09/42 Intra-atrial conduction delay Otherwise normal   Assessment and  Plan

## 2013-01-22 LAB — CBC WITH DIFFERENTIAL/PLATELET
Basophil #: 0.1 10*3/uL (ref 0.0–0.1)
Basophil %: 1 %
Eosinophil #: 0.3 10*3/uL (ref 0.0–0.7)
Eosinophil %: 4.3 %
HCT: 39.7 % — ABNORMAL LOW (ref 40.0–52.0)
HGB: 14 g/dL (ref 13.0–18.0)
Lymphocyte #: 3.3 10*3/uL (ref 1.0–3.6)
Lymphocyte %: 43.6 %
MCH: 32.5 pg (ref 26.0–34.0)
MCHC: 35.3 g/dL (ref 32.0–36.0)
MCV: 92 fL (ref 80–100)
Monocyte #: 0.7 x10 3/mm (ref 0.2–1.0)
Monocyte %: 9 %
Neutrophil #: 3.2 10*3/uL (ref 1.4–6.5)
Neutrophil %: 42.1 %
Platelet: 159 10*3/uL (ref 150–440)
RBC: 4.31 10*6/uL — ABNORMAL LOW (ref 4.40–5.90)
RDW: 14 % (ref 11.5–14.5)
WBC: 7.5 10*3/uL (ref 3.8–10.6)

## 2013-01-22 LAB — BASIC METABOLIC PANEL
Anion Gap: 7 (ref 7–16)
BUN: 23 mg/dL — ABNORMAL HIGH (ref 7–18)
Calcium, Total: 9.3 mg/dL (ref 8.5–10.1)
Chloride: 105 mmol/L (ref 98–107)
Co2: 27 mmol/L (ref 21–32)
Creatinine: 1.4 mg/dL — ABNORMAL HIGH (ref 0.60–1.30)
EGFR (African American): >60
EGFR (Non-African Amer.): 54 — ABNORMAL LOW
Glucose: 119 mg/dL — ABNORMAL HIGH (ref 65–99)
Osmolality: 282 (ref 275–301)
Potassium: 3.6 mmol/L (ref 3.5–5.1)
Sodium: 139 mmol/L (ref 136–145)

## 2013-01-22 LAB — CK TOTAL AND CKMB (NOT AT ARMC)
CK, Total: 86 U/L (ref 35–232)
CK-MB: 0.7 ng/mL (ref 0.5–3.6)

## 2013-01-22 LAB — TROPONIN I: Troponin-I: <0.02 ng/mL

## 2013-01-23 ENCOUNTER — Other Ambulatory Visit: Payer: Self-pay | Admitting: Physician Assistant

## 2013-01-23 ENCOUNTER — Inpatient Hospital Stay (HOSPITAL_COMMUNITY)
Admission: AD | Admit: 2013-01-23 | Discharge: 2013-01-26 | DRG: 243 | Disposition: A | Discharge status: Home / Self Care | Payer: Medicare HMO | Source: Other Acute Inpatient Hospital | Attending: Cardiology | Admitting: Cardiology

## 2013-01-23 ENCOUNTER — Encounter (HOSPITAL_COMMUNITY): Payer: Self-pay | Admitting: Physician Assistant

## 2013-01-23 ENCOUNTER — Inpatient Hospital Stay: Payer: Self-pay | Admitting: Internal Medicine

## 2013-01-23 DIAGNOSIS — I509 Heart failure, unspecified: Secondary | ICD-10-CM | POA: Diagnosis present

## 2013-01-23 DIAGNOSIS — I4819 Other persistent atrial fibrillation: Secondary | ICD-10-CM | POA: Diagnosis present

## 2013-01-23 DIAGNOSIS — I4891 Unspecified atrial fibrillation: Principal | ICD-10-CM | POA: Diagnosis present

## 2013-01-23 DIAGNOSIS — F17200 Nicotine dependence, unspecified, uncomplicated: Secondary | ICD-10-CM | POA: Diagnosis present

## 2013-01-23 DIAGNOSIS — I2510 Atherosclerotic heart disease of native coronary artery without angina pectoris: Secondary | ICD-10-CM | POA: Diagnosis present

## 2013-01-23 DIAGNOSIS — R7989 Other specified abnormal findings of blood chemistry: Secondary | ICD-10-CM

## 2013-01-23 DIAGNOSIS — I110 Hypertensive heart disease with heart failure: Secondary | ICD-10-CM | POA: Diagnosis present

## 2013-01-23 DIAGNOSIS — E669 Obesity, unspecified: Secondary | ICD-10-CM | POA: Diagnosis present

## 2013-01-23 DIAGNOSIS — I5032 Chronic diastolic (congestive) heart failure: Secondary | ICD-10-CM | POA: Diagnosis present

## 2013-01-23 DIAGNOSIS — I10 Essential (primary) hypertension: Secondary | ICD-10-CM

## 2013-01-23 DIAGNOSIS — E876 Hypokalemia: Secondary | ICD-10-CM | POA: Diagnosis present

## 2013-01-23 DIAGNOSIS — J449 Chronic obstructive pulmonary disease, unspecified: Secondary | ICD-10-CM | POA: Diagnosis present

## 2013-01-23 DIAGNOSIS — E785 Hyperlipidemia, unspecified: Secondary | ICD-10-CM

## 2013-01-23 DIAGNOSIS — K219 Gastro-esophageal reflux disease without esophagitis: Secondary | ICD-10-CM | POA: Diagnosis present

## 2013-01-23 DIAGNOSIS — Z7901 Long term (current) use of anticoagulants: Secondary | ICD-10-CM

## 2013-01-23 DIAGNOSIS — J4489 Other specified chronic obstructive pulmonary disease: Secondary | ICD-10-CM | POA: Diagnosis present

## 2013-01-23 DIAGNOSIS — Z79899 Other long term (current) drug therapy: Secondary | ICD-10-CM

## 2013-01-23 DIAGNOSIS — M129 Arthropathy, unspecified: Secondary | ICD-10-CM | POA: Diagnosis present

## 2013-01-23 DIAGNOSIS — E119 Type 2 diabetes mellitus without complications: Secondary | ICD-10-CM | POA: Diagnosis present

## 2013-01-23 DIAGNOSIS — I359 Nonrheumatic aortic valve disorder, unspecified: Secondary | ICD-10-CM

## 2013-01-23 DIAGNOSIS — E1165 Type 2 diabetes mellitus with hyperglycemia: Secondary | ICD-10-CM | POA: Diagnosis present

## 2013-01-23 HISTORY — DX: Atherosclerotic heart disease of native coronary artery without angina pectoris: I25.10

## 2013-01-23 LAB — BASIC METABOLIC PANEL
Anion Gap: 8 (ref 7–16)
BUN: 23 mg/dL — ABNORMAL HIGH (ref 7–18)
BUN: 17 mg/dL (ref 6–23)
CO2: 26 mEq/L (ref 19–32)
Calcium, Total: 8.4 mg/dL — ABNORMAL LOW (ref 8.5–10.1)
Calcium: 9.1 mg/dL (ref 8.4–10.5)
Chloride: 107 mmol/L (ref 98–107)
Chloride: 104 mEq/L (ref 96–112)
Co2: 26 mmol/L (ref 21–32)
Creatinine, Ser: 1.05 mg/dL (ref 0.50–1.35)
Creatinine: 1.33 mg/dL — ABNORMAL HIGH (ref 0.60–1.30)
EGFR (African American): >60
EGFR (Non-African Amer.): 58 — ABNORMAL LOW
GFR calc Af Amer: 87 mL/min — ABNORMAL LOW (ref >90)
GFR calc non Af Amer: 75 mL/min — ABNORMAL LOW (ref >90)
Glucose, Bld: 104 mg/dL — ABNORMAL HIGH (ref 70–99)
Glucose: 97 mg/dL (ref 65–99)
Osmolality: 285 (ref 275–301)
Potassium: 3.4 mmol/L — ABNORMAL LOW (ref 3.5–5.1)
Potassium: 4 mEq/L (ref 3.5–5.1)
Sodium: 141 mmol/L (ref 136–145)
Sodium: 141 mEq/L (ref 135–145)

## 2013-01-23 LAB — DRUG SCREEN, URINE
Amphetamines, Ur Screen: NEGATIVE (ref ?–1000)
Barbiturates, Ur Screen: NEGATIVE (ref ?–200)
Benzodiazepine, Ur Scrn: NEGATIVE (ref ?–200)
Cannabinoid 50 Ng, Ur ~~LOC~~: NEGATIVE (ref ?–50)
Cocaine Metabolite,Ur ~~LOC~~: NEGATIVE (ref ?–300)
MDMA (Ecstasy)Ur Screen: POSITIVE (ref ?–500)
Methadone, Ur Screen: NEGATIVE (ref ?–300)
Opiate, Ur Screen: NEGATIVE (ref ?–300)
Phencyclidine (PCP) Ur S: NEGATIVE (ref ?–25)
Tricyclic, Ur Screen: NEGATIVE (ref ?–1000)

## 2013-01-23 LAB — HEPATIC FUNCTION PANEL A (ARMC)
Albumin: 3.5 g/dL (ref 3.4–5.0)
Alkaline Phosphatase: 83 U/L (ref 50–136)
Bilirubin, Direct: <0.1 mg/dL (ref 0.00–0.20)
Bilirubin,Total: 0.3 mg/dL (ref 0.2–1.0)
SGOT(AST): 17 U/L (ref 15–37)
SGPT (ALT): 27 U/L (ref 12–78)
Total Protein: 6.5 g/dL (ref 6.4–8.2)

## 2013-01-23 LAB — CBC WITH DIFFERENTIAL/PLATELET
Basophil #: 0 10*3/uL (ref 0.0–0.1)
Basophil %: 0.6 %
Eosinophil #: 0.4 10*3/uL (ref 0.0–0.7)
Eosinophil %: 4.5 %
HCT: 37.7 % — ABNORMAL LOW (ref 40.0–52.0)
HGB: 13.4 g/dL (ref 13.0–18.0)
Lymphocyte #: 3.7 10*3/uL — ABNORMAL HIGH (ref 1.0–3.6)
Lymphocyte %: 46.4 %
MCH: 32.6 pg (ref 26.0–34.0)
MCHC: 35.5 g/dL (ref 32.0–36.0)
MCV: 92 fL (ref 80–100)
Monocyte #: 0.7 x10 3/mm (ref 0.2–1.0)
Monocyte %: 9.2 %
Neutrophil #: 3.1 10*3/uL (ref 1.4–6.5)
Neutrophil %: 39.3 %
Platelet: 158 10*3/uL (ref 150–440)
RBC: 4.11 10*6/uL — ABNORMAL LOW (ref 4.40–5.90)
RDW: 14 % (ref 11.5–14.5)
WBC: 7.9 10*3/uL (ref 3.8–10.6)

## 2013-01-23 LAB — URINALYSIS, COMPLETE
Bacteria: NONE SEEN
Bilirubin,UR: NEGATIVE
Blood: NEGATIVE
Glucose,UR: NEGATIVE mg/dL (ref 0–75)
Ketone: NEGATIVE
Leukocyte Esterase: NEGATIVE
Nitrite: NEGATIVE
Ph: 5 (ref 4.5–8.0)
Protein: NEGATIVE
RBC,UR: <1 /HPF (ref 0–5)
Specific Gravity: 1.011 (ref 1.003–1.030)
Squamous Epithelial: 1
WBC UR: 1 /HPF (ref 0–5)

## 2013-01-23 LAB — PROTIME-INR
INR: 2.5
Prothrombin Time: 26.1 secs — ABNORMAL HIGH (ref 11.5–14.7)

## 2013-01-23 LAB — MAGNESIUM
Magnesium: 0.9 mg/dL — ABNORMAL LOW
Magnesium: 1.8 mg/dL (ref 1.5–2.5)

## 2013-01-23 LAB — LIPID PANEL
Cholesterol: 125 mg/dL (ref 0–200)
HDL Cholesterol: 33 mg/dL — ABNORMAL LOW (ref 40–60)
Ldl Cholesterol, Calc: 50 mg/dL (ref 0–100)
Triglycerides: 208 mg/dL — ABNORMAL HIGH (ref 0–200)
VLDL Cholesterol, Calc: 42 mg/dL — ABNORMAL HIGH (ref 5–40)

## 2013-01-23 LAB — TROPONIN I: Troponin-I: 0.02 ng/mL

## 2013-01-23 LAB — HEMOGLOBIN A1C: Hemoglobin A1C: 6.3 % (ref 4.2–6.3)

## 2013-01-23 LAB — TSH: Thyroid Stimulating Horm: 2.13 u[IU]/mL

## 2013-01-23 LAB — MRSA PCR SCREENING: MRSA by PCR: NEGATIVE

## 2013-01-23 LAB — GLUCOSE, CAPILLARY
Glucose-Capillary: 138 mg/dL — ABNORMAL HIGH (ref 70–99)
Glucose-Capillary: 108 mg/dL — ABNORMAL HIGH (ref 70–99)
Glucose-Capillary: 150 mg/dL — ABNORMAL HIGH (ref 70–99)

## 2013-01-23 LAB — PRO B NATRIURETIC PEPTIDE: B-Type Natriuretic Peptide: 634 pg/mL — ABNORMAL HIGH (ref 0–125)

## 2013-01-23 MED ORDER — CEFAZOLIN SODIUM-DEXTROSE 2-3 GM-% IV SOLR
2.0000 g | INTRAVENOUS | Status: DC
  Filled 2013-01-23: qty 50

## 2013-01-23 MED ORDER — SODIUM CHLORIDE 0.9 % IV SOLN: INTRAVENOUS | Status: DC

## 2013-01-23 MED ORDER — ONDANSETRON HCL 4 MG/2ML IJ SOLN: 4.0000 mg | Freq: Four times a day (QID) | INTRAMUSCULAR | Status: DC | PRN

## 2013-01-23 MED ORDER — CHLORHEXIDINE GLUCONATE 4 % EX LIQD: 60.0000 mL | Freq: Once | CUTANEOUS | Status: AC

## 2013-01-23 MED ORDER — METOPROLOL TARTRATE 25 MG PO TABS
25.0000 mg | ORAL_TABLET | Freq: Four times a day (QID) | ORAL | Status: DC
  Administered 2013-01-23 – 2013-01-24 (×2): 25 mg via ORAL
  Filled 2013-01-23 (×6): qty 1

## 2013-01-23 MED ORDER — ATORVASTATIN CALCIUM 40 MG PO TABS
40.0000 mg | ORAL_TABLET | Freq: Every day | ORAL | Status: DC
  Administered 2013-01-23: 40 mg via ORAL
  Filled 2013-01-23 (×2): qty 1

## 2013-01-23 MED ORDER — SODIUM CHLORIDE 0.9 % IV SOLN
INTRAVENOUS | Status: DC
  Administered 2013-01-24: 09:00:00 via INTRAVENOUS

## 2013-01-23 MED ORDER — SODIUM CHLORIDE 0.9 % IJ SOLN: 3.0000 mL | INTRAMUSCULAR | Status: DC | PRN

## 2013-01-23 MED ORDER — CHLORHEXIDINE GLUCONATE 4 % EX LIQD
CUTANEOUS | Status: AC
  Administered 2013-01-23: 23:00:00
  Filled 2013-01-23: qty 60

## 2013-01-23 MED ORDER — SODIUM CHLORIDE 0.9 % IV SOLN: 250.0000 mL | INTRAVENOUS | Status: DC | PRN

## 2013-01-23 MED ORDER — PANTOPRAZOLE SODIUM 40 MG PO TBEC
40.0000 mg | DELAYED_RELEASE_TABLET | Freq: Every day | ORAL | Status: DC
  Administered 2013-01-24: 40 mg via ORAL
  Filled 2013-01-23: qty 1

## 2013-01-23 MED ORDER — ACETAMINOPHEN 325 MG PO TABS: 650.0000 mg | ORAL_TABLET | ORAL | Status: DC | PRN

## 2013-01-23 MED ORDER — SODIUM CHLORIDE 0.9 % IV SOLN
INTRAVENOUS | Status: DC
  Administered 2013-01-24: 07:00:00 via INTRAVENOUS

## 2013-01-23 MED ORDER — GENTAMICIN SULFATE 40 MG/ML IJ SOLN
80.0000 mg | INTRAMUSCULAR | Status: DC
  Filled 2013-01-23: qty 2

## 2013-01-23 MED ORDER — SENNA 8.6 MG PO TABS
1.0000 | ORAL_TABLET | Freq: Every day | ORAL | Status: DC | PRN
  Filled 2013-01-23: qty 1

## 2013-01-23 MED ORDER — WARFARIN SODIUM 2.5 MG PO TABS
2.5000 mg | ORAL_TABLET | Freq: Once | ORAL | Status: AC
  Administered 2013-01-23: 2.5 mg via ORAL
  Filled 2013-01-23: qty 1

## 2013-01-23 MED ORDER — WARFARIN - PHARMACIST DOSING INPATIENT: Freq: Every day | Status: DC

## 2013-01-23 MED ORDER — SODIUM CHLORIDE 0.9 % IJ SOLN
3.0000 mL | Freq: Two times a day (BID) | INTRAMUSCULAR | Status: DC
Start: 2013-01-23 — End: 2013-01-24
  Administered 2013-01-23 – 2013-01-24 (×2): 3 mL via INTRAVENOUS

## 2013-01-23 MED ORDER — INSULIN ASPART 100 UNIT/ML ~~LOC~~ SOLN: 0.0000 [IU] | Freq: Three times a day (TID) | SUBCUTANEOUS | Status: DC

## 2013-01-23 MED ORDER — DIAZEPAM 5 MG PO TABS: 5.0000 mg | ORAL_TABLET | ORAL | Status: DC

## 2013-01-23 NOTE — H&P (Signed)
History and Physical   Patient ID: Gautam Langhorst MRN: [id number], DOB/AGE: 60/02/1951   Admit date: (Not on file) Date of Consult: 01/23/2013  Primary Physician: Dr. Lacie Scotts Primary Cardiologist: previously R. [name], MD Primary Electrophysiologist: G. Ladona Ridgel, MD  HPI: Joziyah Roblero is a 62 y.o. Caucasian male w/ PMHx s/f persistent atrial fibrillation, h/o atrial flutter (s/p RFA 2002), chronic Coumadin anticoagulation, nonobstructive CAD (cath 2001- normal LM, LAD, RCA, 50% RI), h/o tachy-mediated CM/chronic diastolic CHF, DM2, HTN, HLD, obesity, h/o tobacco abuse, COPD and GERD who was transferred from [facility] to [facility] today to undergo AVN ablation and PPM placement tomorrow.   He has failed multiple antiarrhythmic medications including amiodarone (abnormal LFTs), dronedarone (nausea), failed Tikosyn. He has undergone RFA for atrial flutter in 2002. He has had multiple prior DCCVs w/ unsuccessful maintenance of NSR. He followed up with Dr. Graciela Husbands earlier this month- the recommendation was made to consult w/ Dr. Ladona Ridgel to pursue pulmonary vein isolation ablation or AVN ablation w/ PPM placement for definitive a-fib management.   He was admitted at [facility] today w/ atrial fibrillation with RVR (HR 120-155 bpm) after presenting to the ED c/o tachy-palpitations. BNP 634. CEs WNL. BMET- BUN 23/Cr 1.33. K 3.4. INR 2.5. Portable CXR indicated no acute process. He was admitted by the medicine service. A subsequent TnI returned WNL. TSH 2.13. LDL 50, HDL 33, TC 125, TG 208. Hgb A1C 6.3%. BPs were labile (82-85/47-54) thus limiting rate-control. Digoxin IV was started. BPs improved (SBPs 110s), however HR remained elevated (120s). Metoprolol 25mg  q6hr was added.  2D echo today revealed EF 55-60%, moderate concentric LVH, mild AV sclerosis w/o stenosis, LA size at upper limits of normal.    Dr. Kirke Corin spoke w/ Dr. Ladona Ridgel today, and the decision was made to transfer to [facility] for AVN ablation and PPM  placement scheduled for tomorrow. Note, Mg was 0.9 on admission, He received MgSO4 x 2 g IV at 0300. Another 4 g were ordered for 2 PM, but not administered prior to transfer. No follow-up Mg level.   Problem List: Past Medical History  Diagnosis Date  . Persistent atrial fibrillation     recurrent atrial flutter since 2001 s/p DCCVs, multiple failed AADs, h/o tachy-mediated cardiomyopathy  . Hypertension     with hypertensive heart disease  . Hyperlipidemia   . Atrial flutter     radiofrequency ablation in 2001  . Obesity   . Chronic anticoagulation     chronic Coumadin anticoagulation  . Tobacco abuse   . Chronic obstructive pulmonary disease 04/20/2011  . GERD (gastroesophageal reflux disease)   . Arthritis   . Diabetes mellitus, type 2   . CAD (coronary artery disease)     Nonobstructive. Cardiac cath in 2001-50% mid RI, normal LM, LAD, RCA    Past Surgical History  Procedure Laterality Date  . Atrial ablation surgery  2002    atrial flutter; subsequently developed atrial fibrillation  . Implant-ilr      Subsequently removed  . Cardioversion  05/31/2011    Procedure: CARDIOVERSION;  Surgeon: [name]. [name], MD;  Location: [facility];  Service: Cardiovascular;  Laterality: N/A;     Allergies: No Known Allergies  Home Medications: Prior to Admission medications   Medication Sig Start Date End Date Taking? Authorizing Provider  Blood Pressure Monitoring (BLOOD PRESSURE CUFF) MISC ONE BLOOD PRESSURE CUFF PER DR RR TO HAVE PT CHECK BLOOD PRESSURE DAILY IN HOME, USE AS DIRECTED, ONE UNIT NO REFILLS 08/30/12  Kathlen Brunswick, MD  celecoxib (CELEBREX) 200 MG capsule Take 1 capsule (200 mg total) by mouth daily. 06/20/11   Kathlen Brunswick, MD  chlorthalidone (HYGROTON) 25 MG tablet TAKE 1/2 TABLET BY MOUTH DAILY. 12/19/12   Kathlen Brunswick, MD  fish oil-omega-3 fatty acids 1000 MG capsule Take 1 g by mouth daily.      Historical Provider, MD  Fluticasone-Salmeterol (ADVAIR) 250-50  MCG/DOSE AEPB Inhale 2 puffs into the lungs 2 (two) times daily. With spacer 06/20/11   Kathlen Brunswick, MD  furosemide (LASIX) 20 MG tablet Take 20 mg by mouth as needed. Swelling. 05/05/11   Kathlen Brunswick, MD  metoprolol succinate (TOPROL-XL) 50 MG 24 hr tablet Take 2 tablets (100 mg total) by mouth 2 (two) times daily. Take with or immediately following a meal. 01/15/13   Standley Brooking, MD  nitroGLYCERIN (NITROSTAT) 0.4 MG SL tablet Place 1 tablet (0.4 mg total) under the tongue every 5 (five) minutes x 3 doses as needed for chest pain. 06/17/12 06/26/13  Marinus Maw, MD  pantoprazole (PROTONIX) 40 MG tablet Take 1 tablet (40 mg total) by mouth daily. 06/20/11   Kathlen Brunswick, MD  rosuvastatin (CRESTOR) 20 MG tablet Take 1 tablet (20 mg total) by mouth at bedtime. 06/19/11   Jodelle Gross, NP  sitaGLIPtan-metformin (JANUMET) 50-1000 MG per tablet Take 1 tablet by mouth 2 (two) times daily with a meal. 08/07/11   Kathlen Brunswick, MD  warfarin (COUMADIN) 5 MG tablet Take 2.5-5 mg by mouth at bedtime.    Historical Provider, MD    Inpatient Medications:   No prescriptions prior to admission    Family History  Problem Relation Age of Onset  . Alzheimer's disease Mother   . Osteoporosis Mother      History   Social History  . Marital Status: Legally Separated    Spouse Name: N/A    Number of Children: 0  . Years of Education: N/A   Occupational History  . Unemployed    Social History Main Topics  . Smoking status: Former Smoker -- 0.50 packs/day for 42 years    Types: Cigarettes    Quit date: 12/31/2011  . Smokeless tobacco: Never Used     Comment: he is trying to quit  . Alcohol Use: No  . Drug Use: No  . Sexual Activity: Yes   Other Topics Concern  . 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          Review of Systems: General: negative for chills, fever, night sweats or weight changes.  Cardiovascular: positive for palpitations, negative for chest pain, dyspnea on exertion, edema, orthopnea, paroxysmal nocturnal dyspnea or shortness of breath Dermatological: negative for rash Respiratory: negative for cough or wheezing Urologic: negative for hematuria Abdominal: negative for nausea, vomiting, diarrhea, bright red blood per rectum, melena, or hematemesis Neurologic: negative for visual changes, syncope, or dizziness All other systems reviewed and are otherwise negative except as noted above.  Physical Exam:  GENERAL: The patient appears to be at his stated age, in no acute distress.  VITAL SIGNS: Temperature is 97.7, pulse is 130 beats per minute, respiratory rate is 15, blood pressure is 108/78 and oxygen saturation is 98% on room air.  HEENT: Normocephalic,  atraumatic.  NECK: No JVD or carotid bruits.  LUNGS: Normal respiratory effort with no use of accessory muscles. Auscultation reveals normal breath sounds.  HEART: Normal PMI. The patient is tachycardic with irregular rhythm. No gallops or murmurs.  ABDOMEN: Benign, nontender and nondistended.  EXTREMITIES: No clubbing, cyanosis or edema.  SKIN: Warm and dry with no rash.  PSYCHIATRIC: He is alert and oriented x 3 with normal mood and affect.   Labs:  As above. Further labwork pending.   Radiology/Studies:  As above.   EKG: atrial fibrillation w/ RVR, 139 bpm, nonspecific ST changes  ASSESSMENT AND PLAN:   1. Persistent atrial fibrillation w/ RVR   A. S/p multiple failed AADs, DCCVs 2. H/o atrial flutter s/p RFA in 2002 3. Chronic Coumadin anticoagulation  A. INR 2.5 on 01/23/13 4. H/o tachy-mediated cardiomyopathy/chronic diastolic CHF  A. EF 55-60% on echo 01/23/13 5. Nonobstructive CAD 6. Hypomagnesemia 7. Hypokalemia, mild 8. Type 2 DM 9. Hypertension 10. Hyperlipidemia 11. H/o tobacco  abuse 12. COPD 13. Obesity 14. GERD  Arrangements have been made for transfer as above. Will check a stat Mg and BMET on arrival. Continue Coumadin per Dr. Lubertha Basque recommendations. Continue metoprolol tartrate 25mg  PO q6hr. Hold digoxin. Admission and pre-EP procedure orders have been signed & held. NPO past MN. Full echo on CD transported with patient. Resume home meds. Plan for AVN ablation + PPM placement tomorrow.    Signed, R. Hurman Horn, PA-C 01/23/2013, 1:43 PM   The patient was seen earlier today by Dr. Kirke Corin.  Now in NSR.  Plan as above per Dr. Ladona Ridgel.  He has no acute complaints.

## 2013-01-23 NOTE — Consult Note (Signed)
ANTICOAGULATION CONSULT NOTE - Initial Consult  Pharmacy Consult for Coumadin Indication: afib/flutter  No Known Allergies  Patient Measurements: Height: 6' (182.9 cm) Weight: 231 lb 0.7 oz (104.8 kg) IBW/kg (Calculated) : 77.6  Vital Signs: Temp: 97.8 F (36.6 C) (10/09 1400) Temp src: Oral (10/09 1400) BP: 137/88 mmHg (10/09 1400) Pulse Rate: 69 (10/09 1400)  Labs: No results found for this basename: HGB, HCT, PLT, APTT, LABPROT, INR, HEPARINUNFRC, CREATININE, CKTOTAL, CKMB, TROPONINI,  in the last 72 hours  Estimated Creatinine Clearance: 84 ml/min (by C-G formula based on Cr of 1.17).   Medical History: Past Medical History  Diagnosis Date  . Persistent atrial fibrillation     recurrent atrial flutter since 2001 s/p DCCVs, multiple failed AADs, h/o tachy-mediated cardiomyopathy  . Hypertension     with hypertensive heart disease  . Hyperlipidemia   . Atrial flutter     radiofrequency ablation in 2001  . Obesity   . Chronic anticoagulation     chronic Coumadin anticoagulation  . Tobacco abuse   . Chronic obstructive pulmonary disease 04/20/2011  . GERD (gastroesophageal reflux disease)   . Arthritis   . Diabetes mellitus, type 2   . CAD (coronary artery disease)     Nonobstructive. Cardiac cath in 2001-50% mid RI, normal LM, LAD, RCA   Assessment: 62yom on coumadin pta for afib/flutter. Transferred from [facility] to [facility] for AVN ablation and PPM tomorrow. INR is therapeutic today at 2.5 (drawn at [facility], in paper chart).   Home dose: 2.5mg  daily except 5mg  MWF  Goal of Therapy:  INR 2-3 Monitor platelets by anticoagulation protocol: Yes   Plan:  1) Coumadin 2.5mg  x 1 tonight per home regimen 2) Daily INr  Fredrik Rigger 01/23/2013,3:01 PM

## 2013-01-23 NOTE — Care Management Note (Signed)
    Page 1 of 1   01/23/2013     3:01:16 PM   CARE MANAGEMENT NOTE 01/23/2013  Patient:  [name] Cook, [name] Cook   Account Number:  [id number]  Date Initiated:  01/23/2013  Documentation initiated by:  Junius Creamer  Subjective/Objective Assessment:   adm w at fib     Action/Plan:   lives w fam   Anticipated DC Date:     Anticipated DC Plan:        DC Planning Services  CM consult      Choice offered to / List presented to:             Status of service:   Medicare Important Message given?   (If response is "NO", the following Medicare IM given date fields will be blank) Date Medicare IM given:   Date Additional Medicare IM given:    Discharge Disposition:    Per UR Regulation:  Reviewed for med. necessity/level of care/duration of stay  If discussed at Long Length of Stay Meetings, dates discussed:    Comments:

## 2013-01-24 ENCOUNTER — Encounter (HOSPITAL_COMMUNITY)
Admission: AD | Disposition: A | Discharge status: Home / Self Care | Payer: Self-pay | Source: Other Acute Inpatient Hospital | Attending: Cardiology

## 2013-01-24 DIAGNOSIS — I4891 Unspecified atrial fibrillation: Principal | ICD-10-CM

## 2013-01-24 HISTORY — PX: AV NODE ABLATION: SHX1209

## 2013-01-24 HISTORY — PX: INSERT / REPLACE / REMOVE PACEMAKER: SUR710

## 2013-01-24 HISTORY — PX: PERMANENT PACEMAKER INSERTION: SHX5480

## 2013-01-24 LAB — GLUCOSE, CAPILLARY
Glucose-Capillary: 115 mg/dL — ABNORMAL HIGH (ref 70–99)
Glucose-Capillary: 104 mg/dL — ABNORMAL HIGH (ref 70–99)

## 2013-01-24 LAB — BASIC METABOLIC PANEL
BUN: 16 mg/dL (ref 6–23)
CO2: 31 mEq/L (ref 19–32)
Calcium: 9.4 mg/dL (ref 8.4–10.5)
Chloride: 104 mEq/L (ref 96–112)
Creatinine, Ser: 1.25 mg/dL (ref 0.50–1.35)
GFR calc Af Amer: 71 mL/min — ABNORMAL LOW (ref >90)
GFR calc non Af Amer: 61 mL/min — ABNORMAL LOW (ref >90)
Glucose, Bld: 109 mg/dL — ABNORMAL HIGH (ref 70–99)
Potassium: 4.7 mEq/L (ref 3.5–5.1)
Sodium: 141 mEq/L (ref 135–145)

## 2013-01-24 LAB — PROTIME-INR
INR: 2.15 — ABNORMAL HIGH (ref 0.00–1.49)
Prothrombin Time: 23.3 seconds — ABNORMAL HIGH (ref 11.6–15.2)

## 2013-01-24 SURGERY — PERMANENT PACEMAKER INSERTION
Anesthesia: LOCAL

## 2013-01-24 MED ORDER — MIDAZOLAM HCL 5 MG/5ML IJ SOLN
INTRAMUSCULAR | Status: AC
  Filled 2013-01-24: qty 5

## 2013-01-24 MED ORDER — FENTANYL CITRATE 0.05 MG/ML IJ SOLN
INTRAMUSCULAR | Status: AC
  Filled 2013-01-24: qty 2

## 2013-01-24 MED ORDER — ONDANSETRON HCL 4 MG/2ML IJ SOLN: 4.0000 mg | Freq: Four times a day (QID) | INTRAMUSCULAR | Status: DC | PRN

## 2013-01-24 MED ORDER — NITROGLYCERIN 0.4 MG SL SUBL: 0.4000 mg | SUBLINGUAL_TABLET | SUBLINGUAL | Status: DC | PRN

## 2013-01-24 MED ORDER — METOPROLOL SUCCINATE ER 100 MG PO TB24
100.0000 mg | ORAL_TABLET | Freq: Two times a day (BID) | ORAL | Status: DC
  Administered 2013-01-24 – 2013-01-25 (×2): 100 mg via ORAL
  Filled 2013-01-24 (×3): qty 1

## 2013-01-24 MED ORDER — ATORVASTATIN CALCIUM 10 MG PO TABS
10.0000 mg | ORAL_TABLET | Freq: Every day | ORAL | Status: DC
  Administered 2013-01-24 – 2013-01-25 (×2): 10 mg via ORAL
  Filled 2013-01-24 (×3): qty 1

## 2013-01-24 MED ORDER — CELECOXIB 200 MG PO CAPS
200.0000 mg | ORAL_CAPSULE | Freq: Every day | ORAL | Status: DC
  Administered 2013-01-24 – 2013-01-26 (×3): 200 mg via ORAL
  Filled 2013-01-24 (×3): qty 1

## 2013-01-24 MED ORDER — BUPIVACAINE HCL (PF) 0.25 % IJ SOLN
INTRAMUSCULAR | Status: AC
  Filled 2013-01-24: qty 30

## 2013-01-24 MED ORDER — CHLORHEXIDINE GLUCONATE 4 % EX LIQD
CUTANEOUS | Status: AC
  Administered 2013-01-24: 07:00:00
  Filled 2013-01-24: qty 60

## 2013-01-24 MED ORDER — SITAGLIPTIN PHOS-METFORMIN HCL 50-1000 MG PO TABS: 1.0000 | ORAL_TABLET | Freq: Two times a day (BID) | ORAL | Status: DC

## 2013-01-24 MED ORDER — WARFARIN - PHYSICIAN DOSING INPATIENT
Freq: Every day | Status: DC
  Administered 2013-01-24: 18:00:00

## 2013-01-24 MED ORDER — MOMETASONE FURO-FORMOTEROL FUM 100-5 MCG/ACT IN AERO
2.0000 | INHALATION_SPRAY | Freq: Two times a day (BID) | RESPIRATORY_TRACT | Status: DC
  Administered 2013-01-24 – 2013-01-26 (×4): 2 via RESPIRATORY_TRACT
  Filled 2013-01-24: qty 8.8

## 2013-01-24 MED ORDER — LIDOCAINE HCL (PF) 1 % IJ SOLN
INTRAMUSCULAR | Status: AC
  Filled 2013-01-24: qty 60

## 2013-01-24 MED ORDER — METFORMIN HCL 500 MG PO TABS
1000.0000 mg | ORAL_TABLET | Freq: Two times a day (BID) | ORAL | Status: DC
  Administered 2013-01-25 – 2013-01-26 (×3): 1000 mg via ORAL
  Filled 2013-01-24 (×6): qty 2

## 2013-01-24 MED ORDER — ACETAMINOPHEN 325 MG PO TABS
325.0000 mg | ORAL_TABLET | ORAL | Status: DC | PRN
  Administered 2013-01-24 – 2013-01-25 (×2): 650 mg via ORAL
  Filled 2013-01-24 (×2): qty 2

## 2013-01-24 MED ORDER — LINAGLIPTIN 5 MG PO TABS
5.0000 mg | ORAL_TABLET | Freq: Every day | ORAL | Status: DC
  Administered 2013-01-25 – 2013-01-26 (×2): 5 mg via ORAL
  Filled 2013-01-24 (×3): qty 1

## 2013-01-24 MED ORDER — CHLORTHALIDONE 25 MG PO TABS
12.5000 mg | ORAL_TABLET | Freq: Every day | ORAL | Status: DC
  Administered 2013-01-24 – 2013-01-26 (×3): 12.5 mg via ORAL
  Filled 2013-01-24 (×3): qty 0.5

## 2013-01-24 MED ORDER — WARFARIN SODIUM 2.5 MG PO TABS
2.5000 mg | ORAL_TABLET | ORAL | Status: AC
  Administered 2013-01-25: 2.5 mg via ORAL
  Filled 2013-01-24: qty 1

## 2013-01-24 MED ORDER — WARFARIN SODIUM 2.5 MG PO TABS: 2.5000 mg | ORAL_TABLET | Freq: Every evening | ORAL | Status: DC

## 2013-01-24 MED ORDER — CEFAZOLIN SODIUM-DEXTROSE 2-3 GM-% IV SOLR
2.0000 g | Freq: Four times a day (QID) | INTRAVENOUS | Status: AC
  Administered 2013-01-24 – 2013-01-25 (×3): 2 g via INTRAVENOUS
  Filled 2013-01-24 (×3): qty 50

## 2013-01-24 MED ORDER — PANTOPRAZOLE SODIUM 40 MG PO TBEC
40.0000 mg | DELAYED_RELEASE_TABLET | Freq: Every day | ORAL | Status: DC
  Administered 2013-01-24 – 2013-01-26 (×3): 40 mg via ORAL
  Filled 2013-01-24 (×3): qty 1

## 2013-01-24 MED ORDER — OMEGA-3 FATTY ACIDS 1000 MG PO CAPS: 1.0000 g | ORAL_CAPSULE | Freq: Every day | ORAL | Status: DC

## 2013-01-24 MED ORDER — OMEGA-3-ACID ETHYL ESTERS 1 G PO CAPS
1.0000 g | ORAL_CAPSULE | Freq: Two times a day (BID) | ORAL | Status: DC
  Administered 2013-01-24 – 2013-01-26 (×4): 1 g via ORAL
  Filled 2013-01-24 (×5): qty 1

## 2013-01-24 MED ORDER — WARFARIN SODIUM 5 MG PO TABS
5.0000 mg | ORAL_TABLET | ORAL | Status: AC
  Administered 2013-01-24: 5 mg via ORAL
  Filled 2013-01-24: qty 1

## 2013-01-24 NOTE — Progress Notes (Signed)
Patient ID: [name] Cook, male   DOB: [birth year], 62 y.o.   MRN: [id number] Subjective:  I feel better". Denies chest pain or sob  Objective:  Vital Signs in the last 24 hours: Temp:  [97.6 F (36.4 C)-98.5 F (36.9 C)] 98.5 F (36.9 C) (10/10 1123) Pulse Rate:  [61-74] 61 (10/10 1123) Resp:  [10-18] 13 (10/10 1123) BP: (108-156)/(54-108) 123/83 mmHg (10/10 1123) SpO2:  [92 %-100 %] 97 % (10/10 1123) Weight:  [225 lb 15.5 oz (102.5 kg)-231 lb 0.7 oz (104.8 kg)] 225 lb 15.5 oz (102.5 kg) (10/10 0415)  Intake/Output from previous day: 10/09 0701 - 10/10 0700 In: 50 [I.V.:50] Out: 1500 [Urine:1500] Intake/Output from this shift: Total I/O In: 50 [I.V.:50] Out: -   Physical Exam: Well appearing 62 yo man, NAD HEENT: Unremarkable Neck:  No JVD, no thyromegally Back:  No CVA tenderness Lungs:  Clear with no wheezes HEART:  Regular rate rhythm, no murmurs, no rubs, no clicks Abd:  soft, positive bowel sounds, no organomegally, no rebound, no guarding Ext:  2 plus pulses, no edema, no cyanosis, no clubbing Skin:  No rashes no nodules Neuro:  CN II through XII intact, motor grossly intact  Lab Results: No results found for this basename: WBC, HGB, PLT,  in the last 72 hours  Recent Labs  01/23/13 1611 01/24/13 0614  NA 141 141  K 4.0 4.7  CL 104 104  CO2 26 31  GLUCOSE 104* 109*  BUN 17 16  CREATININE 1.05 1.25   No results found for this basename: TROPONINI, CK, MB,  in the last 72 hours Hepatic Function Panel No results found for this basename: PROT, ALBUMIN, AST, ALT, ALKPHOS, BILITOT, BILIDIR, IBILI,  in the last 72 hours No results found for this basename: CHOL,  in the last 72 hours No results found for this basename: PROTIME,  in the last 72 hours  Imaging: No results found.  Cardiac Studies: Tele - nsr  Assessment/Plan:  1. Atrial fib with an RVR 2. H/o atrial flutter ablation 3. HTN Rec: I discussed options with the patient. He has exhausted all medical  therapies for his atrial fib and his ventricular rates are very poorly controlled in atrial fib. We discussed catheter ablation of his atrial fib in detail. He had seen Dr. Johney Frame previously but was not inclined to proceed with atrial fib ablation. I have again brought up this option and told the patient that I would probably try that approach first. He is not inclined to consider catheter ablation of atrial fib. He wants to get "something done that will control his heart once and for all". That said, I have carefully outlined the advantages as well as the potential problems with AV node ablation and PPM, including the possibility of infection, lifelong pacemaker dependence and the possibility that he will feel poorly and need either upgrade to a BiV device or ultimately still require Atrial fib ablation. He wishes to proceed with AV node ablation and PPM insertion.    LOS: 1 day    Gregg Taylor,M.D. 01/24/2013, 12:03 PM

## 2013-01-24 NOTE — CV Procedure (Signed)
DDD PPM insertion followed by AV node ablation without immediate complication. [id number].

## 2013-01-24 NOTE — Interval H&P Note (Signed)
History and Physical Interval Note:  01/24/2013 12:45 PM  [name] Cook  has presented today for surgery, with the diagnosis of CHF  The various methods of treatment have been discussed with the patient and family. After consideration of risks, benefits and other options for treatment, the patient has consented to  Procedure(s): PERMANENT PACEMAKER INSERTION (N/A) and AV node ablation as a surgical intervention .  The patient's history has been reviewed, patient examined, no change in status, stable for surgery.  I have reviewed the patient's chart and labs.  Questions were answered to the patient's satisfaction.     Leonia Reeves.D.

## 2013-01-24 NOTE — H&P (View-Only) (Signed)
Patient ID: [name] Cook, male   DOB: 06/26/1950, 62 y.o.   MRN: [id number] Subjective:  I feel better". Denies chest pain or sob  Objective:  Vital Signs in the last 24 hours: Temp:  [97.6 F (36.4 C)-98.5 F (36.9 C)] 98.5 F (36.9 C) (10/10 1123) Pulse Rate:  [61-74] 61 (10/10 1123) Resp:  [10-18] 13 (10/10 1123) BP: (108-156)/(54-108) 123/83 mmHg (10/10 1123) SpO2:  [92 %-100 %] 97 % (10/10 1123) Weight:  [225 lb 15.5 oz (102.5 kg)-231 lb 0.7 oz (104.8 kg)] 225 lb 15.5 oz (102.5 kg) (10/10 0415)  Intake/Output from previous day: 10/09 0701 - 10/10 0700 In: 50 [I.V.:50] Out: 1500 [Urine:1500] Intake/Output from this shift: Total I/O In: 50 [I.V.:50] Out: -   Physical Exam: Well appearing 62 yo man, NAD HEENT: Unremarkable Neck:  No JVD, no thyromegally Back:  No CVA tenderness Lungs:  Clear with no wheezes HEART:  Regular rate rhythm, no murmurs, no rubs, no clicks Abd:  soft, positive bowel sounds, no organomegally, no rebound, no guarding Ext:  2 plus pulses, no edema, no cyanosis, no clubbing Skin:  No rashes no nodules Neuro:  CN II through XII intact, motor grossly intact  Lab Results: No results found for this basename: WBC, HGB, PLT,  in the last 72 hours  Recent Labs  01/23/13 1611 01/24/13 0614  NA 141 141  K 4.0 4.7  CL 104 104  CO2 26 31  GLUCOSE 104* 109*  BUN 17 16  CREATININE 1.05 1.25   No results found for this basename: TROPONINI, CK, MB,  in the last 72 hours Hepatic Function Panel No results found for this basename: PROT, ALBUMIN, AST, ALT, ALKPHOS, BILITOT, BILIDIR, IBILI,  in the last 72 hours No results found for this basename: CHOL,  in the last 72 hours No results found for this basename: PROTIME,  in the last 72 hours  Imaging: No results found.  Cardiac Studies: Tele - nsr  Assessment/Plan:  1. Atrial fib with an RVR 2. H/o atrial flutter ablation 3. HTN Rec: I discussed options with the patient. He has exhausted all medical  therapies for his atrial fib and his ventricular rates are very poorly controlled in atrial fib. We discussed catheter ablation of his atrial fib in detail. He had seen Dr. Allred previously but was not inclined to proceed with atrial fib ablation. I have again brought up this option and told the patient that I would probably try that approach first. He is not inclined to consider catheter ablation of atrial fib. He wants to get "something done that will control his heart once and for all". That said, I have carefully outlined the advantages as well as the potential problems with AV node ablation and PPM, including the possibility of infection, lifelong pacemaker dependence and the possibility that he will feel poorly and need either upgrade to a BiV device or ultimately still require Atrial fib ablation. He wishes to proceed with AV node ablation and PPM insertion.    LOS: 1 day    Gregg Taylor,M.D. 01/24/2013, 12:03 PM    

## 2013-01-25 ENCOUNTER — Inpatient Hospital Stay (HOSPITAL_COMMUNITY): Payer: Medicare HMO

## 2013-01-25 LAB — GLUCOSE, CAPILLARY: Glucose-Capillary: 117 mg/dL — ABNORMAL HIGH (ref 70–99)

## 2013-01-25 LAB — PROTIME-INR
INR: 2.27 — ABNORMAL HIGH (ref 0.00–1.49)
Prothrombin Time: 24.3 seconds — ABNORMAL HIGH (ref 11.6–15.2)

## 2013-01-25 MED ORDER — METOPROLOL SUCCINATE ER 100 MG PO TB24
100.0000 mg | ORAL_TABLET | Freq: Every day | ORAL | Status: DC
  Administered 2013-01-26: 100 mg via ORAL
  Filled 2013-01-25: qty 1

## 2013-01-25 MED ORDER — LISINOPRIL 10 MG PO TABS
10.0000 mg | ORAL_TABLET | Freq: Every day | ORAL | Status: DC
Start: 2013-01-25 — End: 2013-01-26
  Administered 2013-01-25 – 2013-01-26 (×2): 10 mg via ORAL
  Filled 2013-01-25 (×4): qty 1

## 2013-01-25 NOTE — Progress Notes (Signed)
Patient Name: [name] Cook      SUBJECTIVE recurrent symptomatic atrial fib with aV ablation and PPM yesterday  Without complaint  Past Medical History  Diagnosis Date  . Persistent atrial fibrillation     recurrent atrial flutter since 2001 s/p DCCVs, multiple failed AADs, h/o tachy-mediated cardiomyopathy  . Hypertension     with hypertensive heart disease  . Hyperlipidemia   . Atrial flutter     radiofrequency ablation in 2001  . Obesity   . Chronic anticoagulation     chronic Coumadin anticoagulation  . Tobacco abuse   . Chronic obstructive pulmonary disease 04/20/2011  . GERD (gastroesophageal reflux disease)   . Arthritis   . Diabetes mellitus, type 2   . CAD (coronary artery disease)     Nonobstructive. Cardiac cath in 2001-50% mid RI, normal LM, LAD, RCA    Scheduled Meds:  Scheduled Meds: . atorvastatin  10 mg Oral q1800  . celecoxib  200 mg Oral Daily  . chlorthalidone  12.5 mg Oral Daily  . linagliptin  5 mg Oral Daily  . metFORMIN  1,000 mg Oral BID WC  . metoprolol succinate  100 mg Oral BID  . mometasone-formoterol  2 puff Inhalation BID  . omega-3 acid ethyl esters  1 g Oral BID  . pantoprazole  40 mg Oral Daily  . warfarin  2.5 mg Oral Custom  . Warfarin - Physician Dosing Inpatient   Does not apply q1800   Continuous Infusions:   PHYSICAL EXAM Filed Vitals:   01/24/13 2326 01/25/13 0345 01/25/13 0737 01/25/13 0925  BP: 123/85 115/81    Pulse: 80 80    Temp: 97.6 F (36.4 C) 97.8 F (36.6 C) 97.4 F (36.3 C)   TempSrc: Oral Oral Oral   Resp: 16 23    Height:      Weight:  230 lb 9.6 oz (104.6 kg)    SpO2: 94% 96% 94% 95%  Well developed and nourished in no acute distress HENT normal Neck supple with JVP-flat Clear Wound without hematoma Regular rate and rhythm, no murmurs or gallops Abd-soft with active BS No Clubbing cyanosis edema Skin-warm and dry A & Oriented  Grossly normal sensory and motor function   TELEMETRY: Reviewed  telemetry pt in AV paced     Intake/Output Summary (Last 24 hours) at 01/25/13 1125 Last data filed at 01/25/13 1001  Gross per 24 hour  Intake   1120 ml  Output   2700 ml  Net  -1580 ml    LABS: Basic Metabolic Panel:  Recent Labs Lab 01/23/13 1611 01/24/13 0614  NA 141 141  K 4.0 4.7  CL 104 104  CO2 26 31  GLUCOSE 104* 109*  BUN 17 16  CREATININE 1.05 1.25  CALCIUM 9.1 9.4  MG 1.8  --    Cardiac Enzymes: No results found for this basename: CKTOTAL, CKMB, CKMBINDEX, TROPONINI,  in the last 72 hours CBC: No results found for this basename: WBC, NEUTROABS, HGB, HCT, MCV, PLT,  in the last 168 hours PROTIME:  Recent Labs  01/24/13 0614 01/25/13 0600  LABPROT 23.3* 24.3*  INR 2.15* 2.27*   Liver Function Tests: No results found for this basename: AST, ALT, ALKPHOS, BILITOT, PROT, ALBUMIN,  in the last 72 hours No results found for this basename: LIPASE, AMYLASE,  in the last 72 hours BNP:   Device Interrogation: normal device function   ASSESSMENT AND PLAN:  Active Problems:   * No active hospital problems. *  will transfer to floor and ambulate today with discharge tomorrow as pt is device dependent Will decrease betablocker Add ACE for bp and renal protection with DM Should he be on higher statin dose?  Signed, Sherryl Manges MD  01/25/2013

## 2013-01-25 NOTE — Op Note (Signed)
NAMENAIF, ALABI NO.:  [id number]  MEDICAL RECORD NO.:  [id number]  LOCATION:  2H27C                        FACILITY:  [facility]  PHYSICIAN:  Doylene Canning. Ladona Ridgel, MD    DATE OF BIRTH:  09/18/1950  DATE OF PROCEDURE:  01/24/2013 DATE OF DISCHARGE:                              OPERATIVE REPORT   PROCEDURE PERFORMED:  Insertion of a dual-chamber pacemaker followed by AV node ablation.  INTRODUCTION:  The patient is a 62 year old man with a history of atrial fibrillation for many years.  His history dates back to an atrial flutter problem where he underwent catheter ablation over 10 years ago. He subsequently developed atrial fibrillation.  The patient has had recurrent and persistent atrial fibrillation.  He has been on multiple antiarrhythmic drugs and failed all of them.  Details of his specific antiarrhythmic drug difficulties are unavailable on previous reports. The patient presented to the hospital with atrial fibrillation and rapid ventricular response, and was in atrial fibrillation for approximately 48 hours.  He was referred here.  He was found to have preserved left ventricular function.  Of note, the patient previously had been seen by Dr. Johney Frame to consider AFib ablation, but had decided not to proceed with this.  On presentation, I again offered him the possibility of AV node ablation and pacemaker versus atrial fibrillation ablation and even told him that my recommendation would be first try AFib ablation. However, after careful reflection and consideration, the patient has decided that he wishes to proceed with AV node ablation and pacemaker.  PROCEDURE:  After informed consent was obtained, the patient was taken to the diagnostic EP lab in a fasting state.  After usual preparation and draping, intravenous fentanyl and midazolam was given for sedation. 30 mL of lidocaine was infiltrated into the left infraclavicular region. A 5-cm incision was carried  out over this region.  Electrocautery was utilized to dissect down to the fascial plane.  The cephalic vein was dissected free on the left side.  The Medtronic model 5076 58 cm active fixation pacing lead, serial number [id number] was advanced to the right ventricle and the Medtronic model 5076 52 cm active fixation pacing lead, serial number [id number] was advanced to the right atrium. Mapping was first carried out in the right ventricle.  We mapped multiple sites looking for the narrowest QRS duration.  At a site approximately two-thirds base to apex near the inferior septum, the QRS complex paced was approximately 120 milliseconds.  The lead was actively fixed.  Having accomplished this, the R-waves were 25, the impedance with lead actively fixed was 1200 ohms, and threshold was initially 1.3 V at 0.5 milliseconds.  There was large injury current.  The 10 V pacing did not stimulate the diaphragm.  With the right ventricular lead in satisfactory position, attention was then turned to the atrial lead placed in anterolateral portion of he right atrium where P-waves measured 5 mV.  The pacing impedance was 600 ohms and the threshold was 1.2 V at 0.5 milliseconds.  Again 10 V pacing did not stimulate the diaphragm.  Again, there was a large injury current with active fixation  of the lead.  With these satisfactory parameters, the leads were secured to the sub pectoralis fascia with a figure-of-eight silk suture.  Sewing sleeve was secured with silk suture.  Electrocautery was utilized to make a subcutaneous pocket.  Antibiotic irrigation was utilized to irrigate the pocket and electrocautery was utilized to assure hemostasis.  The Medtronic Adapta L dual-chamber pacemaker, serial number [id number] was connected to the atrial and RV leads and placed back in the subcutaneous pocket.  The pocket was irrigated with antibiotic irrigation.  The incision was closed with 2-0 and 3-0  Vicryl. Benzoin and Steri-Strips were painted on the skin, and a bandage was placed in the skin and the patient was prepped for AV node ablation.  After the patient was more deeply sedated with intravenous fentanyl and Versed, a 7-French quadripolar catheter was inserted percutaneously into the right femoral vein and advanced to the right atrium.  Mapping of the His bundle region was carried out.  The HV interval was approximately 50 milliseconds.  A single RF energy application was delivered to the region of the AV node for approximately 60 seconds.  This resulted in accelerated junctional rhythm followed by the creation of complete heart block.  The patient tolerated this well.  He was observed for 20 minutes.  During this time, he had no recurrent AV node conduction.  His pacemaker was reprogrammed to DDD mode 80 beats per minute and he was returned to his room in satisfactory condition.  COMPLICATIONS:  There were no immediate procedure complications.  RESULTS:  This demonstrates successful implantation of a Medtronic dual- chamber pacemaker followed by successful AV node ablation in a patient with symptomatic atrial fibrillation and uncontrolled ventricular rate who had refused atrial fibrillation with ablation.     Doylene Canning. Ladona Ridgel, MD     GWT/MEDQ  D:  01/24/2013  T:  01/25/2013  Job:  [id number]

## 2013-01-26 ENCOUNTER — Encounter (HOSPITAL_COMMUNITY): Payer: Self-pay | Admitting: Physician Assistant

## 2013-01-26 LAB — PROTIME-INR
INR: 2.12 — ABNORMAL HIGH (ref 0.00–1.49)
Prothrombin Time: 23.1 seconds — ABNORMAL HIGH (ref 11.6–15.2)

## 2013-01-26 MED ORDER — LISINOPRIL 10 MG PO TABS: 10.0000 mg | ORAL_TABLET | Freq: Every day | ORAL | Status: DC

## 2013-01-26 MED ORDER — METOPROLOL SUCCINATE ER 50 MG PO TB24: 100.0000 mg | ORAL_TABLET | Freq: Every day | ORAL | Status: DC

## 2013-01-26 NOTE — Discharge Summary (Signed)
CARDIOLOGY DISCHARGE SUMMARY   Patient ID: [name] Cook MRN: [id number] DOB/AGE: February 15, 1951 62 y.o.  Admit date: 01/23/2013 Discharge date: 01/26/2013  Primary Discharge Diagnosis:    Atrial fibrillation, persistent Secondary Discharge Diagnosis:    Chronic anticoagulation   Diabetes mellitus, type 2  Procedures: Insertion of Medtronic Adapta L dual-chamber pacemaker, serial number [id number], AV node ablation  Hospital Course: [name] Cook is a 62 y.o. male with a history of atrial fibrillation. It has been persistent. He went to [facility] with symptomatic atrial fibrillation. Consideration was given to AV node ablation and pacemaker placement. He was transferred to [facility] for the procedure.  He had an AV node ablation and a Medtronic dual lead pacemaker insertion on 01/24/2013. He tolerated the procedure well.  On 01/25/2013, he was seen by Dr. Graciela Husbands. His device was interrogated and showed normal device function. He was ambulated and his medications were adjusted with a decrease in his beta blocker and the addition of an ACE inhibitor. His statin was also decreased.  On 01/25/2013, [name] was ambulating without chest pain or shortness of breath. His blood pressure was well-controlled and his pacemaker was functioning well. He was evaluated by Dr. Graciela Husbands and all tissue reviewed. He was considered stable for discharge, to follow up as an outpatient.  Labs:  Lab Results  Component Value Date   WBC 8.6 01/15/2013   HGB 14.2 01/15/2013   HCT 41.1 01/15/2013   MCV 92.2 01/15/2013   PLT 174 01/15/2013     Recent Labs Lab 01/24/13 0614  NA 141  K 4.7  CL 104  CO2 31  BUN 16  CREATININE 1.25  CALCIUM 9.4  GLUCOSE 109*   Lipid Panel     Component Value Date/Time   CHOL 137 10/18/2011 1609   TRIG 228* 10/18/2011 1609   HDL 45 10/18/2011 1609   CHOLHDL 3.0 10/18/2011 1609   VLDL 46* 10/18/2011 1609   LDLCALC 46 10/18/2011 1609   LDLCALC 119 05/05/2008    Pro B Natriuretic  peptide (BNP)  Date/Time Value Range Status  06/04/2011  7:08 PM 378.5* 0 - 125 pg/mL Final  05/07/2011  5:15 PM 628.7* 0 - 125 pg/mL Final    Recent Labs  01/26/13 0418  INR 2.12*      Radiology: Dg Chest 2 View 01/25/2013   CLINICAL DATA:  Post pacemaker insertion.  EXAM: CHEST  2 VIEW  COMPARISON:  01/15/2013  FINDINGS: Two views of the chest demonstrate a left dual lead cardiac pacemaker. Leads in the region of the right atrium and right ventricle. No evidence for a pneumothorax. Heart and mediastinum are within normal limits. No evidence for pleural effusions. Negative for airspace disease.  IMPRESSION: Placement of cardiac pacemaker without complicating features. No acute chest findings.   Electronically Signed   By: Richarda Overlie M.D.   On: 01/25/2013 07:57   EKG:  AV pacing  FOLLOW UP PLANS AND APPOINTMENTS No Known Allergies   Medication List         celecoxib 200 MG capsule  Commonly known as:  CELEBREX  Take 200 mg by mouth daily.     chlorthalidone 25 MG tablet  Commonly known as:  HYGROTON  Take 12.5 mg by mouth daily.     fish oil-omega-3 fatty acids 1000 MG capsule  Take 1 g by mouth daily.     Fluticasone-Salmeterol 250-50 MCG/DOSE Aepb  Commonly known as:  ADVAIR  Inhale 2 puffs into the lungs  every 12 (twelve) hours.     lisinopril 10 MG tablet  Commonly known as:  PRINIVIL,ZESTRIL  Take 1 tablet (10 mg total) by mouth daily.     metoprolol succinate 50 MG 24 hr tablet  Commonly known as:  TOPROL-XL  Take 2 tablets (100 mg total) by mouth daily. Take with or immediately following a meal.     nitroGLYCERIN 0.4 MG SL tablet  Commonly known as:  NITROSTAT  Place 0.4 mg under the tongue every 5 (five) minutes as needed for chest pain.     pantoprazole 40 MG tablet  Commonly known as:  PROTONIX  Take 40 mg by mouth daily.     rosuvastatin 20 MG tablet  Commonly known as:  CRESTOR  Take 20 mg by mouth at bedtime.     sitaGLIPtin-metformin 50-1000 MG  per tablet  Commonly known as:  JANUMET  Take 1 tablet by mouth 2 (two) times daily with a meal.     warfarin 5 MG tablet  Commonly known as:  COUMADIN  Take 2.5-5 mg by mouth every evening. Takes 5mg  on Mondays, Wednesdays, and Fridays. Takes 2.5mg  all other days        Discharge Orders   Future Appointments Provider Department Dept Phone   01/27/2013 4:00 PM [facility] Coumadin [facility] [place] [phone]   01/27/2013 4:15 PM [facility] Nurse [facility] [place] [phone]   Patient should bring all current BP medications.   03/24/2013 8:30 AM Marinus Maw, MD [facility] [place] [phone]   Future Orders Complete By Expires   Diet - low sodium heart healthy  As directed    Increase activity slowly  As directed      Follow-up Information   Follow up with  [facility] [place] On 01/27/2013. (Coumadin check and nurse visit)    Contact information:   [address] [place] [address]       BRING ALL MEDICATIONS WITH YOU TO FOLLOW UP APPOINTMENTS  Time spent with patient to include physician time: 35 min Signed: Theodore Demark, PA-C 01/26/2013, 12:03 PM Co-Sign MD

## 2013-01-26 NOTE — Progress Notes (Signed)
Patient Name: [name] Cook      SUBJECTIVE recurrent symptomatic atrial fib with aV ablation and PPM friday  Without complaint  Past Medical History  Diagnosis Date  . Persistent atrial fibrillation     recurrent atrial flutter since 2001 s/p DCCVs, multiple failed AADs, h/o tachy-mediated cardiomyopathy  . Hypertension     with hypertensive heart disease  . Hyperlipidemia   . Atrial flutter     radiofrequency ablation in 2001  . Obesity   . Chronic anticoagulation     chronic Coumadin anticoagulation  . Tobacco abuse   . Chronic obstructive pulmonary disease 04/20/2011  . GERD (gastroesophageal reflux disease)   . Arthritis   . Diabetes mellitus, type 2   . CAD (coronary artery disease)     Nonobstructive. Cardiac cath in 2001-50% mid RI, normal LM, LAD, RCA    Scheduled Meds:  Scheduled Meds: . atorvastatin  10 mg Oral q1800  . celecoxib  200 mg Oral Daily  . chlorthalidone  12.5 mg Oral Daily  . linagliptin  5 mg Oral Daily  . lisinopril  10 mg Oral Daily  . metFORMIN  1,000 mg Oral BID WC  . metoprolol succinate  100 mg Oral Daily  . mometasone-formoterol  2 puff Inhalation BID  . omega-3 acid ethyl esters  1 g Oral BID  . pantoprazole  40 mg Oral Daily  . Warfarin - Physician Dosing Inpatient   Does not apply q1800   Continuous Infusions:   PHYSICAL EXAM Filed Vitals:   01/25/13 1149 01/25/13 1312 01/25/13 1945 01/26/13 0504  BP:  120/76 101/66 113/75  Pulse:  79 80 80  Temp:  97.7 F (36.5 C) 97.9 F (36.6 C) 97.7 F (36.5 C)  TempSrc:  Oral Oral Oral  Resp: 14 16 18 19   Height:      Weight:      SpO2:  99% 95% 94%  Well developed and nourished in no acute distress HENT normal Neck supple with JVP-flat Clear Wound without hematoma Regular rate and rhythm, no murmurs or gallops Abd-soft with active BS No Clubbing cyanosis edema Skin-warm and dry A & Oriented  Grossly normal sensory and motor function   TELEMETRY: Reviewed telemetry pt in AV  paced     Intake/Output Summary (Last 24 hours) at 01/26/13 1003 Last data filed at 01/26/13 0700  Gross per 24 hour  Intake    660 ml  Output    300 ml  Net    360 ml    LABS: Basic Metabolic Panel:  Recent Labs Lab 01/23/13 1611 01/24/13 0614  NA 141 141  K 4.0 4.7  CL 104 104  CO2 26 31  GLUCOSE 104* 109*  BUN 17 16  CREATININE 1.05 1.25  CALCIUM 9.1 9.4  MG 1.8  --    Cardiac Enzymes: No results found for this basename: CKTOTAL, CKMB, CKMBINDEX, TROPONINI,  in the last 72 hours CBC: No results found for this basename: WBC, NEUTROABS, HGB, HCT, MCV, PLT,  in the last 168 hours PROTIME:  Recent Labs  01/24/13 0614 01/25/13 0600 01/26/13 0418  LABPROT 23.3* 24.3* 23.1*  INR 2.15* 2.27* 2.12*   Liver Function Tests: No results found for this basename: AST, ALT, ALKPHOS, BILITOT, PROT, ALBUMIN,  in the last 72 hours No results found for this basename: LIPASE, AMYLASE,  in the last 72 hours BNP:   Device Interrogation: normal device function   ASSESSMENT AND PLAN:   Discharge will need Bmet in 2weeks  at wound check  On new ace Increase lipitor to 40     Signed, Sherryl Manges MD  01/26/2013

## 2013-01-26 NOTE — Progress Notes (Signed)
Went over discharge instructions with the patient. Did thorough education on care instructions after a pacemaker. Patient understood discharge instructions and had no additional questions or concerns. IV D/C'd and patient taken off the monitor. Patient stable and ready for discharge. Stanton Kidney R

## 2013-02-06 ENCOUNTER — Ambulatory Visit (INDEPENDENT_AMBULATORY_CARE_PROVIDER_SITE_OTHER): Payer: Medicare HMO | Admitting: *Deleted

## 2013-02-06 DIAGNOSIS — I4819 Other persistent atrial fibrillation: Secondary | ICD-10-CM

## 2013-02-06 DIAGNOSIS — Z7901 Long term (current) use of anticoagulants: Secondary | ICD-10-CM

## 2013-02-06 DIAGNOSIS — I4892 Unspecified atrial flutter: Secondary | ICD-10-CM

## 2013-02-06 DIAGNOSIS — I4891 Unspecified atrial fibrillation: Secondary | ICD-10-CM

## 2013-02-06 LAB — POCT INR: INR: 2.1

## 2013-02-11 ENCOUNTER — Encounter: Payer: Medicare HMO | Admitting: Cardiology

## 2013-02-12 ENCOUNTER — Ambulatory Visit (INDEPENDENT_AMBULATORY_CARE_PROVIDER_SITE_OTHER): Payer: Medicare HMO | Admitting: *Deleted

## 2013-02-12 DIAGNOSIS — Z950 Presence of cardiac pacemaker: Secondary | ICD-10-CM

## 2013-02-12 DIAGNOSIS — I4819 Other persistent atrial fibrillation: Secondary | ICD-10-CM

## 2013-02-12 DIAGNOSIS — I4891 Unspecified atrial fibrillation: Secondary | ICD-10-CM

## 2013-02-12 LAB — PACEMAKER DEVICE OBSERVATION
AL AMPLITUDE: 1 mv
AL IMPEDENCE PM: 500 Ohm
ATRIAL PACING PM: 84
BAMS-0001: 150 {beats}/min
BATTERY VOLTAGE: 2.8 V
BRDY-0002RV: 80 {beats}/min
BRDY-0003RV: 130 {beats}/min
BRDY-0004RV: 130 {beats}/min
RV LEAD AMPLITUDE: 22.4 mv
RV LEAD IMPEDENCE PM: 944 Ohm
RV LEAD THRESHOLD: 0.75 V
VENTRICULAR PACING PM: 100

## 2013-02-12 NOTE — Progress Notes (Signed)
Pt seen in device clinic for follow up of recently implanted pacemaker.  Wound well healed.  No redness, swelling, or edema.  Steri-strips removed today.   Device interrogated and found to be functioning normally.  Changed mode from DDD to DDDR due to flat histograms & pt c/o dyspnea on exertion. Kept lower rate at 80bpm due to AV Node Ablation on 01/24/13. See PaceArt for full details.  Pt to follow up with Dr. Ladona Ridgel 05/12/13.   [name] Cook 02/12/2013 9:52 AM

## 2013-02-20 ENCOUNTER — Other Ambulatory Visit: Payer: Self-pay

## 2013-02-23 ENCOUNTER — Encounter: Payer: Self-pay | Admitting: Internal Medicine

## 2013-02-25 ENCOUNTER — Encounter: Payer: Self-pay | Admitting: Internal Medicine

## 2013-02-26 ENCOUNTER — Telehealth: Payer: Self-pay | Admitting: Internal Medicine

## 2013-02-26 ENCOUNTER — Other Ambulatory Visit: Payer: Self-pay | Admitting: Cardiology

## 2013-02-26 NOTE — Telephone Encounter (Signed)
Spoke with patient c/o dizziness and SOB with walking.

## 2013-02-26 NOTE — Telephone Encounter (Signed)
New Problem:  Pt states he is going into a-fib and he wants to speak to [place]. Please advise

## 2013-02-27 ENCOUNTER — Ambulatory Visit (INDEPENDENT_AMBULATORY_CARE_PROVIDER_SITE_OTHER): Payer: Medicare HMO | Admitting: *Deleted

## 2013-02-27 DIAGNOSIS — Z7901 Long term (current) use of anticoagulants: Secondary | ICD-10-CM

## 2013-02-27 DIAGNOSIS — I4819 Other persistent atrial fibrillation: Secondary | ICD-10-CM

## 2013-02-27 DIAGNOSIS — I4892 Unspecified atrial flutter: Secondary | ICD-10-CM

## 2013-02-27 DIAGNOSIS — I4891 Unspecified atrial fibrillation: Secondary | ICD-10-CM

## 2013-02-27 LAB — POCT INR: INR: 2.2

## 2013-02-27 MED ORDER — FUROSEMIDE 20 MG PO TABS: 20.0000 mg | ORAL_TABLET | Freq: Every day | ORAL | Status: DC

## 2013-02-27 NOTE — Telephone Encounter (Signed)
Left message for patient to return my call.

## 2013-02-27 NOTE — Telephone Encounter (Signed)
Follow UP:  Pt returning [facility] call.

## 2013-02-27 NOTE — Telephone Encounter (Signed)
Discussed with Dr Ladona Ridgel and we are going to stop his Chlorthalidone 25mg  and start Furosemide 20mg  daily to see if this helps with his SOB.  He will call me back on Mon to report his symptoms   His medication was called in to his pharmacy

## 2013-03-03 ENCOUNTER — Telehealth: Payer: Self-pay | Admitting: Internal Medicine

## 2013-03-03 NOTE — Telephone Encounter (Signed)
Spoke with patient and he says he feels no different fwith the change in medication.  He is okay as long as he is sitting and not moving around.  If he gets up and does anything he becomes SOB and dizzy.  He does not feel he has gone to the BR more with the new medication adjustment

## 2013-03-03 NOTE — Telephone Encounter (Signed)
New message  Patient med's were changed, he was to call you and follow up. Patient feels no difference, please call patient and advise.

## 2013-03-04 NOTE — Telephone Encounter (Signed)
See 11/13 phone note.  

## 2013-03-04 NOTE — Telephone Encounter (Signed)
Deliah Boston, RN at 03/03/2013 12:08 PM    Status: Signed        Spoke with patient and he says he feels no different fwith the change in medication. He is okay as long as he is sitting and not moving around. If he gets up and does anything he becomes SOB and dizzy. He does not feel he has gone to the BR more with the new medication adjustment         Minda Meo at 03/03/2013 11:33 AM    Status: Signed        New message  Patient med's were changed, he was to call you and follow up. Patient feels no difference, please call patient and advise.

## 2013-03-06 NOTE — Telephone Encounter (Signed)
Discussed with Dr Ladona Ridgel, we are going to work patient in to see Dr Ladona Ridgel.  Melissa is calling to schedule patient

## 2013-03-14 ENCOUNTER — Emergency Department (HOSPITAL_COMMUNITY): Payer: Medicare HMO

## 2013-03-14 ENCOUNTER — Encounter (HOSPITAL_COMMUNITY): Payer: Self-pay | Admitting: Emergency Medicine

## 2013-03-14 ENCOUNTER — Inpatient Hospital Stay (HOSPITAL_COMMUNITY)
Admission: EM | Admit: 2013-03-14 | Discharge: 2013-03-18 | DRG: 281 | Disposition: A | Discharge status: Home / Self Care | Payer: Medicare HMO | Attending: Cardiology | Admitting: Cardiology

## 2013-03-14 DIAGNOSIS — Z6831 Body mass index (BMI) 31.0-31.9, adult: Secondary | ICD-10-CM

## 2013-03-14 DIAGNOSIS — I2510 Atherosclerotic heart disease of native coronary artery without angina pectoris: Secondary | ICD-10-CM | POA: Diagnosis present

## 2013-03-14 DIAGNOSIS — Z7901 Long term (current) use of anticoagulants: Secondary | ICD-10-CM

## 2013-03-14 DIAGNOSIS — E785 Hyperlipidemia, unspecified: Secondary | ICD-10-CM | POA: Diagnosis present

## 2013-03-14 DIAGNOSIS — J4489 Other specified chronic obstructive pulmonary disease: Secondary | ICD-10-CM | POA: Diagnosis present

## 2013-03-14 DIAGNOSIS — J449 Chronic obstructive pulmonary disease, unspecified: Secondary | ICD-10-CM | POA: Diagnosis present

## 2013-03-14 DIAGNOSIS — K219 Gastro-esophageal reflux disease without esophagitis: Secondary | ICD-10-CM | POA: Diagnosis present

## 2013-03-14 DIAGNOSIS — I369 Nonrheumatic tricuspid valve disorder, unspecified: Secondary | ICD-10-CM

## 2013-03-14 DIAGNOSIS — I428 Other cardiomyopathies: Secondary | ICD-10-CM | POA: Diagnosis present

## 2013-03-14 DIAGNOSIS — E119 Type 2 diabetes mellitus without complications: Secondary | ICD-10-CM | POA: Diagnosis present

## 2013-03-14 DIAGNOSIS — E669 Obesity, unspecified: Secondary | ICD-10-CM | POA: Diagnosis present

## 2013-03-14 DIAGNOSIS — I4891 Unspecified atrial fibrillation: Secondary | ICD-10-CM | POA: Diagnosis present

## 2013-03-14 DIAGNOSIS — I10 Essential (primary) hypertension: Secondary | ICD-10-CM

## 2013-03-14 DIAGNOSIS — I4819 Other persistent atrial fibrillation: Secondary | ICD-10-CM

## 2013-03-14 DIAGNOSIS — I119 Hypertensive heart disease without heart failure: Secondary | ICD-10-CM | POA: Diagnosis present

## 2013-03-14 DIAGNOSIS — R0789 Other chest pain: Secondary | ICD-10-CM | POA: Diagnosis present

## 2013-03-14 DIAGNOSIS — M129 Arthropathy, unspecified: Secondary | ICD-10-CM | POA: Diagnosis present

## 2013-03-14 DIAGNOSIS — I214 Non-ST elevation (NSTEMI) myocardial infarction: Principal | ICD-10-CM

## 2013-03-14 DIAGNOSIS — Z950 Presence of cardiac pacemaker: Secondary | ICD-10-CM

## 2013-03-14 DIAGNOSIS — R079 Chest pain, unspecified: Secondary | ICD-10-CM

## 2013-03-14 DIAGNOSIS — Z87891 Personal history of nicotine dependence: Secondary | ICD-10-CM

## 2013-03-14 HISTORY — DX: Sleep apnea, unspecified: G47.30

## 2013-03-14 LAB — BASIC METABOLIC PANEL
BUN: 22 mg/dL (ref 6–23)
CO2: 24 mEq/L (ref 19–32)
Calcium: 9 mg/dL (ref 8.4–10.5)
Chloride: 105 mEq/L (ref 96–112)
Creatinine, Ser: 1.06 mg/dL (ref 0.50–1.35)
GFR calc Af Amer: 86 mL/min — ABNORMAL LOW (ref >90)
GFR calc non Af Amer: 74 mL/min — ABNORMAL LOW (ref >90)
Glucose, Bld: 170 mg/dL — ABNORMAL HIGH (ref 70–99)
Potassium: 4 mEq/L (ref 3.5–5.1)
Sodium: 138 mEq/L (ref 135–145)

## 2013-03-14 LAB — CBC WITH DIFFERENTIAL/PLATELET
Basophils Absolute: 0 10*3/uL (ref 0.0–0.1)
Basophils Relative: 1 % (ref 0–1)
Eosinophils Absolute: 0.2 10*3/uL (ref 0.0–0.7)
Eosinophils Relative: 4 % (ref 0–5)
HCT: 36.7 % — ABNORMAL LOW (ref 39.0–52.0)
Hemoglobin: 12.7 g/dL — ABNORMAL LOW (ref 13.0–17.0)
Lymphocytes Relative: 35 % (ref 12–46)
Lymphs Abs: 1.6 10*3/uL (ref 0.7–4.0)
MCH: 32.1 pg (ref 26.0–34.0)
MCHC: 34.6 g/dL (ref 30.0–36.0)
MCV: 92.7 fL (ref 78.0–100.0)
Monocytes Absolute: 0.3 10*3/uL (ref 0.1–1.0)
Monocytes Relative: 7 % (ref 3–12)
Neutro Abs: 2.4 10*3/uL (ref 1.7–7.7)
Neutrophils Relative %: 52 % (ref 43–77)
Platelets: 132 10*3/uL — ABNORMAL LOW (ref 150–400)
RBC: 3.96 MIL/uL — ABNORMAL LOW (ref 4.22–5.81)
RDW: 14 % (ref 11.5–15.5)
WBC: 4.6 10*3/uL (ref 4.0–10.5)

## 2013-03-14 LAB — GLUCOSE, CAPILLARY
Glucose-Capillary: 163 mg/dL — ABNORMAL HIGH (ref 70–99)
Glucose-Capillary: 124 mg/dL — ABNORMAL HIGH (ref 70–99)

## 2013-03-14 LAB — TROPONIN I
Troponin I: <0.3 ng/mL (ref <0.30)
Troponin I: 2.34 ng/mL (ref <0.30)
Troponin I: 2.59 ng/mL (ref <0.30)
Troponin I: 2.61 ng/mL (ref <0.30)

## 2013-03-14 LAB — T4, FREE: Free T4: 1.38 ng/dL (ref 0.80–1.80)

## 2013-03-14 LAB — HEPARIN LEVEL (UNFRACTIONATED): Heparin Unfractionated: 0.36 IU/mL (ref 0.30–0.70)

## 2013-03-14 LAB — PROTIME-INR
INR: 1.11 (ref 0.00–1.49)
Prothrombin Time: 14.1 seconds (ref 11.6–15.2)

## 2013-03-14 LAB — PRO B NATRIURETIC PEPTIDE: Pro B Natriuretic peptide (BNP): 799.6 pg/mL — ABNORMAL HIGH (ref 0–125)

## 2013-03-14 LAB — TSH: TSH: 1.459 u[IU]/mL (ref 0.350–4.500)

## 2013-03-14 MED ORDER — WARFARIN - PHARMACIST DOSING INPATIENT: Freq: Every day | Status: DC

## 2013-03-14 MED ORDER — SODIUM CHLORIDE 0.9 % IV SOLN: 250.0000 mL | INTRAVENOUS | Status: DC | PRN

## 2013-03-14 MED ORDER — NITROGLYCERIN 2 % TD OINT
1.0000 [in_us] | TOPICAL_OINTMENT | Freq: Once | TRANSDERMAL | Status: AC
  Administered 2013-03-14: 1 [in_us] via TOPICAL
  Filled 2013-03-14: qty 1

## 2013-03-14 MED ORDER — LISINOPRIL 10 MG PO TABS
10.0000 mg | ORAL_TABLET | Freq: Every day | ORAL | Status: DC
  Administered 2013-03-14 – 2013-03-18 (×5): 10 mg via ORAL
  Filled 2013-03-14 (×5): qty 1

## 2013-03-14 MED ORDER — SODIUM CHLORIDE 0.9 % IJ SOLN
3.0000 mL | Freq: Two times a day (BID) | INTRAMUSCULAR | Status: DC
  Administered 2013-03-14 – 2013-03-17 (×3): 3 mL via INTRAVENOUS

## 2013-03-14 MED ORDER — WARFARIN SODIUM 7.5 MG PO TABS
7.5000 mg | ORAL_TABLET | Freq: Once | ORAL | Status: DC
  Filled 2013-03-14: qty 1

## 2013-03-14 MED ORDER — ASPIRIN 81 MG PO CHEW
324.0000 mg | CHEWABLE_TABLET | Freq: Once | ORAL | Status: AC
  Administered 2013-03-14: 324 mg via ORAL
  Filled 2013-03-14: qty 4

## 2013-03-14 MED ORDER — SODIUM CHLORIDE 0.9 % IJ SOLN: 3.0000 mL | INTRAMUSCULAR | Status: DC | PRN

## 2013-03-14 MED ORDER — ASPIRIN 81 MG PO CHEW
81.0000 mg | CHEWABLE_TABLET | Freq: Every day | ORAL | Status: DC
  Administered 2013-03-15 – 2013-03-16 (×2): 81 mg via ORAL
  Filled 2013-03-14 (×2): qty 1

## 2013-03-14 MED ORDER — NITROGLYCERIN 0.4 MG SL SUBL: 0.4000 mg | SUBLINGUAL_TABLET | SUBLINGUAL | Status: DC | PRN

## 2013-03-14 MED ORDER — ATORVASTATIN CALCIUM 40 MG PO TABS
40.0000 mg | ORAL_TABLET | Freq: Every day | ORAL | Status: DC
  Administered 2013-03-14 – 2013-03-18 (×5): 40 mg via ORAL
  Filled 2013-03-14 (×5): qty 1

## 2013-03-14 MED ORDER — HEPARIN (PORCINE) IN NACL 100-0.45 UNIT/ML-% IJ SOLN
1400.0000 [IU]/h | INTRAMUSCULAR | Status: DC
  Administered 2013-03-14 – 2013-03-16 (×4): 1400 [IU]/h via INTRAVENOUS
  Filled 2013-03-14 (×6): qty 250

## 2013-03-14 MED ORDER — CELECOXIB 200 MG PO CAPS
200.0000 mg | ORAL_CAPSULE | Freq: Every day | ORAL | Status: DC
  Administered 2013-03-14 – 2013-03-18 (×5): 200 mg via ORAL
  Filled 2013-03-14 (×5): qty 1

## 2013-03-14 MED ORDER — LINAGLIPTIN 5 MG PO TABS
5.0000 mg | ORAL_TABLET | Freq: Every day | ORAL | Status: DC
  Administered 2013-03-14 – 2013-03-18 (×5): 5 mg via ORAL
  Filled 2013-03-14 (×5): qty 1

## 2013-03-14 MED ORDER — ACETAMINOPHEN 325 MG PO TABS
650.0000 mg | ORAL_TABLET | ORAL | Status: DC | PRN
  Administered 2013-03-15: 650 mg via ORAL
  Filled 2013-03-14: qty 2

## 2013-03-14 MED ORDER — PANTOPRAZOLE SODIUM 40 MG PO TBEC
40.0000 mg | DELAYED_RELEASE_TABLET | Freq: Every day | ORAL | Status: DC
  Administered 2013-03-14 – 2013-03-18 (×5): 40 mg via ORAL
  Filled 2013-03-14 (×5): qty 1

## 2013-03-14 MED ORDER — METFORMIN HCL 500 MG PO TABS
1000.0000 mg | ORAL_TABLET | Freq: Two times a day (BID) | ORAL | Status: DC
  Administered 2013-03-14: 1000 mg via ORAL
  Filled 2013-03-14 (×4): qty 2

## 2013-03-14 MED ORDER — METOPROLOL SUCCINATE ER 100 MG PO TB24
100.0000 mg | ORAL_TABLET | Freq: Every day | ORAL | Status: DC
  Administered 2013-03-14 – 2013-03-18 (×5): 100 mg via ORAL
  Filled 2013-03-14 (×5): qty 1

## 2013-03-14 MED ORDER — ONDANSETRON HCL 4 MG/2ML IJ SOLN: 4.0000 mg | Freq: Four times a day (QID) | INTRAMUSCULAR | Status: DC | PRN

## 2013-03-14 MED ORDER — MOMETASONE FURO-FORMOTEROL FUM 100-5 MCG/ACT IN AERO
2.0000 | INHALATION_SPRAY | Freq: Two times a day (BID) | RESPIRATORY_TRACT | Status: DC
  Administered 2013-03-14 – 2013-03-18 (×8): 2 via RESPIRATORY_TRACT
  Filled 2013-03-14: qty 8.8

## 2013-03-14 MED ORDER — SITAGLIPTIN PHOS-METFORMIN HCL 50-1000 MG PO TABS: 1.0000 | ORAL_TABLET | Freq: Two times a day (BID) | ORAL | Status: DC

## 2013-03-14 MED ORDER — HEPARIN BOLUS VIA INFUSION
4000.0000 [IU] | Freq: Once | INTRAVENOUS | Status: AC
  Administered 2013-03-14: 4000 [IU] via INTRAVENOUS
  Filled 2013-03-14: qty 4000

## 2013-03-14 MED ORDER — FUROSEMIDE 40 MG PO TABS
40.0000 mg | ORAL_TABLET | Freq: Every day | ORAL | Status: DC
  Administered 2013-03-14: 40 mg via ORAL
  Filled 2013-03-14 (×2): qty 1

## 2013-03-14 NOTE — Progress Notes (Signed)
  Echocardiogram 2D Echocardiogram has been performed.  Georgian Co 03/14/2013, 3:11 PM

## 2013-03-14 NOTE — ED Provider Notes (Signed)
CSN: [id number]     Arrival date & time 03/14/13  0445 History   First MD Initiated Contact with Patient 03/14/13 [phone]     Chief Complaint  Patient presents with  . Chest Pain   (Consider location/radiation/quality/duration/timing/severity/associated sxs/prior Treatment) Patient is a 62 y.o. male presenting with chest pain. The history is provided by the patient. No language interpreter was used.  Chest Pain Pain location:  L chest Pain quality: pressure   Pain radiates to:  Does not radiate Pain radiates to the back: no   Pain severity:  Moderate Onset quality:  Sudden Duration:  2 hours Timing:  Constant Progression:  Resolved Chronicity:  New Context: at rest   Relieved by:  Aspirin and nitroglycerin Worsened by:  Nothing tried Ineffective treatments:  None tried Associated symptoms: shortness of breath   Associated symptoms: no abdominal pain, no back pain, no cough, no diaphoresis, no dizziness, no dysphagia, no fatigue, no fever, no headache, no nausea, no numbness, not vomiting and no weakness   Risk factors: coronary artery disease, high cholesterol, hypertension, male sex and obesity     Past Medical History  Diagnosis Date  . Persistent atrial fibrillation     recurrent atrial flutter since 2001 s/p DCCVs, multiple failed AADs, h/o tachy-mediated cardiomyopathy  . Hypertension     with hypertensive heart disease  . Hyperlipidemia   . Atrial flutter     radiofrequency ablation in 2001  . Obesity   . Chronic anticoagulation     chronic Coumadin anticoagulation  . Tobacco abuse   . Chronic obstructive pulmonary disease 04/20/2011  . GERD (gastroesophageal reflux disease)   . Arthritis   . Diabetes mellitus, type 2   . CAD (coronary artery disease)     Nonobstructive. Cardiac cath in 2001-50% mid RI, normal LM, LAD, RCA   Past Surgical History  Procedure Laterality Date  . Atrial ablation surgery  2002    atrial flutter; subsequently developed atrial  fibrillation  . Implant-ilr      Subsequently removed  . Cardioversion  05/31/2011    Procedure: CARDIOVERSION;  Surgeon: [name]. [name], MD;  Location: [facility];  Service: Cardiovascular;  Laterality: N/A;  . Permanent pacemaker insertion  01/24/2013     Medtronic Adapta L dual-chamber pacemaker, serial number [id number]   . Av node ablation  01/24/2013   Family History  Problem Relation Age of Onset  . Alzheimer's disease Mother   . Osteoporosis Mother    History  Substance Use Topics  . Smoking status: Former Smoker -- 0.50 packs/day for 42 years    Types: Cigarettes    Quit date: 12/31/2011  . Smokeless tobacco: Never Used     Comment: he is trying to quit  . Alcohol Use: No    Review of Systems  Constitutional: Negative for fever, diaphoresis, activity change, appetite change and fatigue.  HENT: Negative for congestion, facial swelling, rhinorrhea and trouble swallowing.   Eyes: Negative for photophobia and pain.  Respiratory: Positive for shortness of breath. Negative for cough and chest tightness.   Cardiovascular: Positive for chest pain. Negative for leg swelling.  Gastrointestinal: Negative for nausea, vomiting, abdominal pain, diarrhea and constipation.  Endocrine: Negative for polydipsia and polyuria.  Genitourinary: Negative for dysuria, urgency, decreased urine volume and difficulty urinating.  Musculoskeletal: Negative for back pain and gait problem.  Skin: Negative for color change, rash and wound.  Allergic/Immunologic: Negative for immunocompromised state.  Neurological: Negative for dizziness, facial asymmetry, speech difficulty, weakness,  numbness and headaches.  Psychiatric/Behavioral: Negative for confusion, decreased concentration and agitation.    Allergies  Review of patient's allergies indicates no known allergies.  Home Medications   Current Outpatient Rx  Name  Route  Sig  Dispense  Refill  . celecoxib (CELEBREX) 200 MG capsule   Oral    Take 200 mg by mouth daily.         . fish oil-omega-3 fatty acids 1000 MG capsule   Oral   Take 1 g by mouth daily.           . Fluticasone-Salmeterol (ADVAIR) 250-50 MCG/DOSE AEPB   Inhalation   Inhale 2 puffs into the lungs every 12 (twelve) hours.         . furosemide (LASIX) 20 MG tablet   Oral   Take 1 tablet (20 mg total) by mouth daily.   90 tablet   3   . lisinopril (PRINIVIL,ZESTRIL) 10 MG tablet   Oral   Take 1 tablet (10 mg total) by mouth daily.   30 tablet   11   . metoprolol succinate (TOPROL-XL) 50 MG 24 hr tablet   Oral   Take 2 tablets (100 mg total) by mouth daily. Take with or immediately following a meal.   30 tablet   11   . pantoprazole (PROTONIX) 40 MG tablet   Oral   Take 40 mg by mouth daily.         . rosuvastatin (CRESTOR) 20 MG tablet   Oral   Take 20 mg by mouth at bedtime.         . sitaGLIPtin-metformin (JANUMET) 50-1000 MG per tablet   Oral   Take 1 tablet by mouth 2 (two) times daily with a meal.         . warfarin (COUMADIN) 5 MG tablet   Oral   Take 2.5-5 mg by mouth daily at 6 PM. 2.5 mg on M W F, and 5 mg all other days         . nitroGLYCERIN (NITROSTAT) 0.4 MG SL tablet   Sublingual   Place 0.4 mg under the tongue every 5 (five) minutes as needed for chest pain.          BP 129/88  Pulse 82  Temp(Src) 97.4 F (36.3 C) (Oral)  Resp 18  SpO2 100% Physical Exam  Constitutional: He is oriented to person, place, and time. He appears well-developed and well-nourished. No distress.  HENT:  Head: Normocephalic and atraumatic.  Mouth/Throat: No oropharyngeal exudate.  Eyes: Pupils are equal, round, and reactive to light.  Neck: Normal range of motion. Neck supple.  Cardiovascular: Normal rate, regular rhythm and normal heart sounds.  Exam reveals no gallop and no friction rub.   No murmur heard. Pulmonary/Chest: Effort normal and breath sounds normal. No respiratory distress. He has no wheezes. He has no  rales.  Abdominal: Soft. Bowel sounds are normal. He exhibits no distension and no mass. There is no tenderness. There is no rebound and no guarding.  Musculoskeletal: Normal range of motion. He exhibits no edema and no tenderness.  Neurological: He is alert and oriented to person, place, and time.  Skin: Skin is warm and dry.  Psychiatric: He has a normal mood and affect.    ED Course  Procedures (including critical care time) Labs Review Labs Reviewed  CBC WITH DIFFERENTIAL - Abnormal; Notable for the following:    RBC 3.96 (*)    Hemoglobin 12.7 (*)  HCT 36.7 (*)    Platelets 132 (*)    All other components within normal limits  BASIC METABOLIC PANEL - Abnormal; Notable for the following:    Glucose, Bld 170 (*)    GFR calc non Af Amer 74 (*)    GFR calc Af Amer 86 (*)    All other components within normal limits  PRO B NATRIURETIC PEPTIDE - Abnormal; Notable for the following:    Pro B Natriuretic peptide (BNP) 799.6 (*)    All other components within normal limits  TROPONIN I   Imaging Review Dg Chest Port 1 View  03/14/2013   CLINICAL DATA:  Chest pain and shortness of breath. History of atrial fibrillation.  EXAM: PORTABLE CHEST - 1 VIEW  COMPARISON:  01/25/2013  FINDINGS: Stable appearance of cardiac pacemaker. Normal heart size and pulmonary vascularity. No focal airspace disease in the lungs. No blunting of costophrenic angles. No pneumothorax.  IMPRESSION: No active disease.   Electronically Signed   By: Burman Nieves M.D.   On: 03/14/2013 05:41    EKG Interpretation   None      Date: 03/14/2013  Rate: 82  Rhythm: AV pacing  QRS Axis: left  Intervals: normal  ST/T Wave abnormalities: nonspecific ST/T changes and AV pacing  Conduction Disutrbances:none  Narrative Interpretation:   Old EKG Reviewed: unchanged    MDM   1. Chest pain    Pt is a 62 y.o. male with Pmhx as above who presents with sudden onset L sided chest pressure that woke him from  sleep at 2am and continued until he received ASA & NTG x2 by EMS.  Pt CP free upon arrival. He states he has had several months of SOB w/ exertion and occasional mild CP, but has not had CP at rest prior tonight. EKG showed paced rhythm, unchanged from prior. Trop negative, CXR unremarkable, BNP 800. [facility] consulted for ACS r/o.          Shanna Cisco, MD 03/14/13 [phone]

## 2013-03-14 NOTE — Progress Notes (Signed)
ANTICOAGULATION CONSULT NOTE - Follow Up Consult  Pharmacy Consult for Heparin  Indication: chest pain/ACS and atrial fibrillation  No Known Allergies  Patient Measurements: Height: 6' 0.05" (183 cm) Weight: 230 lb 2.6 oz (104.4 kg) IBW/kg (Calculated) : 77.71 Heparin Dosing Weight: ~99 kg  Vital Signs: Temp: 98.1 F (36.7 C) (11/28 2027) Temp src: Oral (11/28 2027) BP: 111/70 mmHg (11/28 2027) Pulse Rate: 79 (11/28 2027)  Labs:  Recent Labs  03/14/13 0538 03/14/13 1112 03/14/13 1255 03/14/13 1953 03/14/13 2224  HGB 12.7*  --   --   --   --   HCT 36.7*  --   --   --   --   PLT 132*  --   --   --   --   LABPROT  --  14.1  --   --   --   INR  --  1.11  --   --   --   HEPARINUNFRC  --   --   --   --  0.36  CREATININE 1.06  --   --   --   --   TROPONINI <0.30  --  2.34* 2.59* 2.61*    Estimated Creatinine Clearance: 92.7 ml/min (by C-G formula based on Cr of 1.06).   Medications:  Heparin 1400 units/hr  Assessment: 62 y/o M on heparin for CP, hx of afib/flutter. First HL is 0.36.   Goal of Therapy:  Heparin level 0.3-0.7 units/ml Monitor platelets by anticoagulation protocol: Yes   Plan:  -Continue heparin at 1400 units/hr -Confirmatory HL with AM labs -Daily CBC/HL -F/U cardiology plans  Thank you for allowing me to take part in this patient's care,  Abran Duke, PharmD Clinical Pharmacist Phone: [phone] Pager: [phone] 03/14/2013 11:40 PM

## 2013-03-14 NOTE — H&P (Signed)
HPI: 62 year old male with past medical history of diabetes mellitus, hypertension, hyperlipidemia, previous atrial flutter ablation, paroxysmal atrial fibrillation for evaluation of chest pain and dyspnea. Patient did have a cardiac catheterization in 2001 demonstrating a 50% circumflex lesion. Last echocardiogram in January of 2013 showed normal LV function, mild left atrial enlargement. Nuclear study in September of 2013 showed an ejection fraction of 60% and no ischemia or infarction. Patient has had problems with recurrent atrial fibrillation. He had increased liver functions with amiodarone. Tikosyn therapy unsuccessful. Patient had nausea with Multaq and this was discontinued. In October of 2014 the patient had pacemaker placement and ablation of his AV node. Patient presents today with complaints of dyspnea and chest pain. He has dyspnea with activities. He noted orthopnea this morning. No pedal edema. He has also had chest pain both with activities and at rest. The pain is a pressure without radiation. It is not pleuritic or positional. He had an hour of chest pain this morning and presented to the emergency room. He also has some dizziness and "pressure in his head". No syncope.   (Not in a hospital admission)  No Known Allergies  Past Medical History  Diagnosis Date  . Persistent atrial fibrillation     recurrent atrial flutter since 2001 s/p DCCVs, multiple failed AADs, h/o tachy-mediated cardiomyopathy  . Hypertension     with hypertensive heart disease  . Hyperlipidemia   . Atrial flutter     radiofrequency ablation in 2001  . Obesity   . Chronic anticoagulation     chronic Coumadin anticoagulation  . Tobacco abuse   . Chronic obstructive pulmonary disease 04/20/2011  . GERD (gastroesophageal reflux disease)   . Arthritis   . Diabetes mellitus, type 2   . CAD (coronary artery disease)     Nonobstructive. Cardiac cath in 2001-50% mid RI, normal LM, LAD, RCA    Past  Surgical History  Procedure Laterality Date  . Atrial ablation surgery  2002    atrial flutter; subsequently developed atrial fibrillation  . Implant-ilr      Subsequently removed  . Cardioversion  05/31/2011    Procedure: CARDIOVERSION;  Surgeon: [name]. [name], MD;  Location: [facility];  Service: Cardiovascular;  Laterality: N/A;  . Permanent pacemaker insertion  01/24/2013     Medtronic Adapta L dual-chamber pacemaker, serial number [id number]   . Av node ablation  01/24/2013    History   Social History  . Marital Status: Legally Separated    Spouse Name: N/A    Number of Children: 1  . Years of Education: N/A   Occupational History  . Unemployed   .     Social History Main Topics  . Smoking status: Former Smoker -- 0.50 packs/day for 42 years    Types: Cigarettes    Quit date: 12/31/2011  . Smokeless tobacco: Never Used     Comment: he is trying to quit  . Alcohol Use: No  . Drug Use: No  . Sexual Activity: Yes   Other Topics Concern  . Not on file   Social History Narrative   Unable to afford medications   Resides with girlfriend      Pt lives in [place]   Disabled (arthritis), previously worked at an [employer] and [employer] treatment center.          Family History  Problem Relation Age of Onset  . Alzheimer's disease Mother   . Osteoporosis Mother     ROS:  no fevers or chills, productive cough, hemoptysis, dysphasia, odynophagia, melena, hematochezia, dysuria, hematuria, rash, seizure activity, orthopnea, PND, pedal edema, claudication. Remaining systems are negative.  Physical Exam:   Blood pressure 129/88, pulse 82, temperature 97.4 F (36.3 C), temperature source Oral, resp. rate 18, SpO2 100.00%.  General:  Well developed/well nourished in NAD Skin warm/dry Patient not depressed No peripheral clubbing Back-normal HEENT-normal/normal eyelids Neck supple/normal carotid upstroke bilaterally; no bruits; no JVD; no thyromegaly chest - CTA/  normal expansion, pacemaker left chest CV - RRR/normal S1 and S2; no murmurs, rubs or gallops;  PMI nondisplaced Abdomen -NT/ND, no HSM, no mass, + bowel sounds, no bruit 2+ femoral pulses, no bruits Ext- trace edema, no chords, 2+ DP Neuro-grossly nonfocal  ECG sinus with ventricular pacing.  Results for orders placed during the hospital encounter of 03/14/13 (from the past 48 hour(s))  CBC WITH DIFFERENTIAL     Status: Abnormal   Collection Time    03/14/13  5:38 AM      Result Value Range   WBC 4.6  4.0 - 10.5 K/uL   RBC 3.96 (*) 4.22 - 5.81 MIL/uL   Hemoglobin 12.7 (*) 13.0 - 17.0 g/dL   HCT 16.1 (*) 09.6 - 04.5 %   MCV 92.7  78.0 - 100.0 fL   MCH 32.1  26.0 - 34.0 pg   MCHC 34.6  30.0 - 36.0 g/dL   RDW 40.9  81.1 - 91.4 %   Platelets 132 (*) 150 - 400 K/uL   Neutrophils Relative % 52  43 - 77 %   Neutro Abs 2.4  1.7 - 7.7 K/uL   Lymphocytes Relative 35  12 - 46 %   Lymphs Abs 1.6  0.7 - 4.0 K/uL   Monocytes Relative 7  3 - 12 %   Monocytes Absolute 0.3  0.1 - 1.0 K/uL   Eosinophils Relative 4  0 - 5 %   Eosinophils Absolute 0.2  0.0 - 0.7 K/uL   Basophils Relative 1  0 - 1 %   Basophils Absolute 0.0  0.0 - 0.1 K/uL  BASIC METABOLIC PANEL     Status: Abnormal   Collection Time    03/14/13  5:38 AM      Result Value Range   Sodium 138  135 - 145 mEq/L   Potassium 4.0  3.5 - 5.1 mEq/L   Chloride 105  96 - 112 mEq/L   CO2 24  19 - 32 mEq/L   Glucose, Bld 170 (*) 70 - 99 mg/dL   BUN 22  6 - 23 mg/dL   Creatinine, Ser 7.82  0.50 - 1.35 mg/dL   Calcium 9.0  8.4 - 95.6 mg/dL   GFR calc non Af Amer 74 (*) >90 mL/min   GFR calc Af Amer 86 (*) >90 mL/min   Comment: (NOTE)     The eGFR has been calculated using the CKD EPI equation.     This calculation has not been validated in all clinical situations.     eGFR's persistently <90 mL/min signify possible Chronic Kidney     Disease.  PRO B NATRIURETIC PEPTIDE     Status: Abnormal   Collection Time    03/14/13  5:38 AM       Result Value Range   Pro B Natriuretic peptide (BNP) 799.6 (*) 0 - 125 pg/mL  TROPONIN I     Status: None   Collection Time    03/14/13  5:38 AM  Result Value Range   Troponin I <0.30  <0.30 ng/mL   Comment:            Due to the release kinetics of cTnI,     a negative result within the first hours     of the onset of symptoms does not rule out     myocardial infarction with certainty.     If myocardial infarction is still suspected,     repeat the test at appropriate intervals.    Dg Chest Port 1 View  03/14/2013   CLINICAL DATA:  Chest pain and shortness of breath. History of atrial fibrillation.  EXAM: PORTABLE CHEST - 1 VIEW  COMPARISON:  01/25/2013  FINDINGS: Stable appearance of cardiac pacemaker. Normal heart size and pulmonary vascularity. No focal airspace disease in the lungs. No blunting of costophrenic angles. No pneumothorax.  IMPRESSION: No active disease.   Electronically Signed   By: Burman Nieves M.D.   On: 03/14/2013 05:41    Assessment/Plan 1 chest pain-symptoms are atypical. Plan to admit and rule out myocardial infarction with serial enzymes. If negative plan nuclear study tomorrow morning. 2 dyspnea-etiology unclear. Pro BNP mildly elevated. Increase Lasix to 40 mg daily. Follow renal function. 3 paroxysmal atrial fibrillation-patient is status post pacemaker placement with AV node ablation. Continue coumadin. 4 hypertension-continue preadmission medications. Next line 5 diabetes mellitus-follow CBGs. 6 hyperlipidemia-continue statin.  Olga Millers MD 03/14/2013, 8:48 AM

## 2013-03-14 NOTE — Progress Notes (Signed)
CRITICAL VALUE ALERT  Critical value received:  Troponin 2.34  Date of notification:  03/14/13  Time of notification:  14:13  Critical value read back:yes  Nurse who received alert:  Lethea Killings  MD notified (1st page):  Ward Givens, NP  Time of first page:  14:35   MD notified (2nd page):  Time of second page:  Responding MD:  Ward Givens, NP  Time MD responded:  14:40

## 2013-03-14 NOTE — ED Notes (Signed)
Per EMS, pt was awoken by CP tonight. EMS gave 324 asprin and 2 nitro tabs. CP relieved at this time.

## 2013-03-14 NOTE — Consult Note (Addendum)
ANTICOAGULATION CONSULT NOTE - Initial Consult  Pharmacy Consult for Coumadin-->Heparin Indication: atrial fibrillation, chest pain/ACS  No Known Allergies  Vital Signs: Temp: 97.4 F (36.3 C) (11/28 1054) Temp src: Oral (11/28 1054) BP: 119/82 mmHg (11/28 1253) Pulse Rate: 83 (11/28 1253)  Labs:  Recent Labs  03/14/13 0538 03/14/13 1112  HGB 12.7*  --   HCT 36.7*  --   PLT 132*  --   LABPROT  --  14.1  INR  --  1.11  CREATININE 1.06  --   TROPONINI <0.30  --     The CrCl is unknown because both a height and weight (above a minimum accepted value) are required for this calculation.   Medical History: Past Medical History  Diagnosis Date  . Persistent atrial fibrillation     recurrent atrial flutter since 2001 s/p DCCVs, multiple failed AADs, h/o tachy-mediated cardiomyopathy  . Hypertension     with hypertensive heart disease  . Hyperlipidemia   . Atrial flutter     radiofrequency ablation in 2001  . Obesity   . Chronic anticoagulation     chronic Coumadin anticoagulation  . Tobacco abuse   . Chronic obstructive pulmonary disease 04/20/2011  . GERD (gastroesophageal reflux disease)   . Arthritis   . Diabetes mellitus, type 2   . CAD (coronary artery disease)     Nonobstructive. Cardiac cath in 2001-50% mid RI, normal LM, LAD, RCA   Assessment: 62yom with hx recurrent aflutter s/p ablation in 2001 and PAF on coumadin pta is being admitted for chest pain and dyspnea. INR on admission is 1.11 - ? compliance.   Home dose: 5mg  daily except 2.5mg  MWF - last dose reportedly 11/27.  Goal of Therapy:  INR 2-3 Monitor platelets by anticoagulation protocol: Yes   Plan:  1) Coumadin 7.5mg  x 1 tonight 2) Daily INR  Fredrik Rigger 03/14/2013,1:57 PM  Troponin has resulted positive at 2.34. Coumadin discontinued and IV heparin to begin with plans for probable cath.  Wt = 104.6kg Ht = 183cm IBW = 77.5kg Heparin dosing weight = 99kg  Plan: 1) Heparin  bolus 4000 units x 1 2) Heparin drip at 1400 units/hr 3) Check 6 hour heparin level 4) Daily heparin level and CBC  Fredrik Rigger 03/14/2013, 2:41 PM

## 2013-03-15 DIAGNOSIS — I214 Non-ST elevation (NSTEMI) myocardial infarction: Principal | ICD-10-CM

## 2013-03-15 LAB — BASIC METABOLIC PANEL
BUN: 16 mg/dL (ref 6–23)
CO2: 25 mEq/L (ref 19–32)
Calcium: 8.6 mg/dL (ref 8.4–10.5)
Chloride: 105 mEq/L (ref 96–112)
Creatinine, Ser: 1.1 mg/dL (ref 0.50–1.35)
GFR calc Af Amer: 82 mL/min — ABNORMAL LOW (ref >90)
GFR calc non Af Amer: 71 mL/min — ABNORMAL LOW (ref >90)
Glucose, Bld: 128 mg/dL — ABNORMAL HIGH (ref 70–99)
Potassium: 4.1 mEq/L (ref 3.5–5.1)
Sodium: 141 mEq/L (ref 135–145)

## 2013-03-15 LAB — CBC
HCT: 37.4 % — ABNORMAL LOW (ref 39.0–52.0)
Hemoglobin: 13.2 g/dL (ref 13.0–17.0)
MCH: 32.7 pg (ref 26.0–34.0)
MCHC: 35.3 g/dL (ref 30.0–36.0)
MCV: 92.6 fL (ref 78.0–100.0)
Platelets: 130 10*3/uL — ABNORMAL LOW (ref 150–400)
RBC: 4.04 MIL/uL — ABNORMAL LOW (ref 4.22–5.81)
RDW: 14.2 % (ref 11.5–15.5)
WBC: 6.2 10*3/uL (ref 4.0–10.5)

## 2013-03-15 LAB — GLUCOSE, CAPILLARY
Glucose-Capillary: 120 mg/dL — ABNORMAL HIGH (ref 70–99)
Glucose-Capillary: 149 mg/dL — ABNORMAL HIGH (ref 70–99)
Glucose-Capillary: 132 mg/dL — ABNORMAL HIGH (ref 70–99)
Glucose-Capillary: 100 mg/dL — ABNORMAL HIGH (ref 70–99)

## 2013-03-15 LAB — HEPARIN LEVEL (UNFRACTIONATED): Heparin Unfractionated: 0.34 IU/mL (ref 0.30–0.70)

## 2013-03-15 LAB — PROTIME-INR
INR: 1.12 (ref 0.00–1.49)
Prothrombin Time: 14.2 seconds (ref 11.6–15.2)

## 2013-03-15 NOTE — Progress Notes (Signed)
Utilization Review Completed.   Milanni Ayub, RN, BSN Nurse Case Manager  

## 2013-03-15 NOTE — Progress Notes (Signed)
ANTICOAGULATION CONSULT NOTE - Follow Up Consult  Pharmacy Consult for Heparin  Indication: chest pain/ACS and atrial fibrillation  No Known Allergies  Patient Measurements: Height: 6' 0.05" (183 cm) Weight: 230 lb 2.6 oz (104.4 kg) IBW/kg (Calculated) : 77.71 Heparin Dosing Weight: ~99 kg  Vital Signs: Temp: 97.5 F (36.4 C) (11/29 0434) Temp src: Oral (11/29 0434) BP: 119/78 mmHg (11/29 0434) Pulse Rate: 81 (11/29 0434)  Labs:  Recent Labs  03/14/13 0538 03/14/13 1112 03/14/13 1255 03/14/13 1953 03/14/13 2224 03/15/13 0635  HGB 12.7*  --   --   --   --  13.2  HCT 36.7*  --   --   --   --  37.4*  PLT 132*  --   --   --   --  130*  LABPROT  --  14.1  --   --   --  14.2  INR  --  1.11  --   --   --  1.12  HEPARINUNFRC  --   --   --   --  0.36 0.34  CREATININE 1.06  --   --   --   --  1.10  TROPONINI <0.30  --  2.34* 2.59* 2.61*  --     Estimated Creatinine Clearance: 89.3 ml/min (by C-G formula based on Cr of 1.1).   Medications:  Heparin 1400 units/hr  Assessment: 62 y/o M on heparin for CP, hx of afib/flutter. Renal function stable. Heparin Level remains therapeutic this morning. No signs of bleeding noted.   Goal of Therapy:  Heparin level 0.3-0.7 units/ml Monitor platelets by anticoagulation protocol: Yes   Plan:  -Continue heparin at 1400 units/hr -Daily CBC/HL -F/U cardiology plans  Thank you for allowing me to take part in this patient's care, Piedad Climes, PharmD Clinical Pharmacist - Resident Pager: [phone] Pharmacy: [phone] 03/15/2013 8:45 AM

## 2013-03-15 NOTE — Progress Notes (Signed)
    Subjective:  Denies CP or dyspnea   Objective:  Filed Vitals:   03/14/13 1300 03/14/13 1938 03/14/13 2027 03/15/13 0434  BP:   111/70 119/78  Pulse:   79 81  Temp:   98.1 F (36.7 C) 97.5 F (36.4 C)  TempSrc:   Oral Oral  Resp:   20 18  Height: 6' 0.05" (1.83 m)     Weight: 230 lb 9.6 oz (104.6 kg)   230 lb 2.6 oz (104.4 kg)  SpO2:  98% 97% 96%    Intake/Output from previous day:  Intake/Output Summary (Last 24 hours) at 03/15/13 0901 Last data filed at 03/15/13 0434  Gross per 24 hour  Intake      3 ml  Output   2300 ml  Net  -2297 ml    Physical Exam: Physical exam: Well-developed well-nourished in no acute distress.  Skin is warm and dry.  HEENT is normal.  Neck is supple. No thyromegaly.  Chest is clear to auscultation with normal expansion.  Cardiovascular exam is regular rate and rhythm.  Abdominal exam nontender or distended. No masses palpated. Extremities show no edema. neuro grossly intact    Lab Results: Basic Metabolic Panel:  Recent Labs  40/98/11 0538 03/15/13 0635  NA 138 141  K 4.0 4.1  CL 105 105  CO2 24 25  GLUCOSE 170* 128*  BUN 22 16  CREATININE 1.06 1.10  CALCIUM 9.0 8.6   CBC:  Recent Labs  03/14/13 0538 03/15/13 0635  WBC 4.6 6.2  NEUTROABS 2.4  --   HGB 12.7* 13.2  HCT 36.7* 37.4*  MCV 92.7 92.6  PLT 132* 130*   Cardiac Enzymes:  Recent Labs  03/14/13 1255 03/14/13 1953 03/14/13 2224  TROPONINI 2.34* 2.59* 2.61*     Assessment/Plan:  1 NSTEMI- patient has ruled in; continue ASA, heparin, statin, metoprolol; proceed with cath Monday (risks and benefits discussed and patient agrees to proceed).  2 dyspnea-etiology unclear. Pro BNP mildly elevated; ? Anginal equivalent. Plan hold lasix prior to cath. 3 paroxysmal atrial fibrillation-patient is status post pacemaker placement with AV node ablation. Coumadin on hold prior to cath; continue heparin. 4 hypertension-continue preadmission medications.  5  diabetes mellitus-follow CBGs. Hold glucophage. 6 hyperlipidemia-continue statin.   Olga Millers 03/15/2013, 9:01 AM

## 2013-03-16 LAB — GLUCOSE, CAPILLARY
Glucose-Capillary: 134 mg/dL — ABNORMAL HIGH (ref 70–99)
Glucose-Capillary: 108 mg/dL — ABNORMAL HIGH (ref 70–99)
Glucose-Capillary: 120 mg/dL — ABNORMAL HIGH (ref 70–99)
Glucose-Capillary: 109 mg/dL — ABNORMAL HIGH (ref 70–99)

## 2013-03-16 LAB — CBC
HCT: 36.4 % — ABNORMAL LOW (ref 39.0–52.0)
Hemoglobin: 12.5 g/dL — ABNORMAL LOW (ref 13.0–17.0)
MCH: 31.7 pg (ref 26.0–34.0)
MCHC: 34.3 g/dL (ref 30.0–36.0)
MCV: 92.4 fL (ref 78.0–100.0)
Platelets: 139 10*3/uL — ABNORMAL LOW (ref 150–400)
RBC: 3.94 MIL/uL — ABNORMAL LOW (ref 4.22–5.81)
RDW: 14.1 % (ref 11.5–15.5)
WBC: 5.5 10*3/uL (ref 4.0–10.5)

## 2013-03-16 LAB — HEPARIN LEVEL (UNFRACTIONATED): Heparin Unfractionated: 0.5 IU/mL (ref 0.30–0.70)

## 2013-03-16 MED ORDER — ASPIRIN 81 MG PO CHEW
81.0000 mg | CHEWABLE_TABLET | ORAL | Status: AC
  Administered 2013-03-17: 81 mg via ORAL
  Filled 2013-03-16: qty 1

## 2013-03-16 MED ORDER — SODIUM CHLORIDE 0.9 % IV SOLN: 250.0000 mL | INTRAVENOUS | Status: DC | PRN

## 2013-03-16 MED ORDER — SODIUM CHLORIDE 0.9 % IJ SOLN: 3.0000 mL | INTRAMUSCULAR | Status: DC | PRN

## 2013-03-16 MED ORDER — DIAZEPAM 2 MG PO TABS
2.0000 mg | ORAL_TABLET | ORAL | Status: AC
  Administered 2013-03-17: 2 mg via ORAL
  Filled 2013-03-16: qty 1

## 2013-03-16 MED ORDER — SODIUM CHLORIDE 0.9 % IV SOLN
1.0000 mL/kg/h | INTRAVENOUS | Status: DC
  Administered 2013-03-17 (×2): 1 mL/kg/h via INTRAVENOUS

## 2013-03-16 MED ORDER — SODIUM CHLORIDE 0.9 % IJ SOLN
3.0000 mL | Freq: Two times a day (BID) | INTRAMUSCULAR | Status: DC
  Administered 2013-03-16: 3 mL via INTRAVENOUS

## 2013-03-16 NOTE — Progress Notes (Addendum)
ANTICOAGULATION CONSULT NOTE - Follow Up Consult  Pharmacy Consult for Heparin  Indication: chest pain/ACS and atrial fibrillation  No Known Allergies  Patient Measurements: Height: 6' 0.05" (183 cm) Weight: 231 lb 6.4 oz (104.962 kg) IBW/kg (Calculated) : 77.71 Heparin Dosing Weight: ~99 kg  Vital Signs: Temp: 98 F (36.7 C) (11/30 0611) Temp src: Oral (11/30 0611) BP: 137/84 mmHg (11/30 0611) Pulse Rate: 82 (11/30 0611)  Labs:  Recent Labs  03/14/13 0538 03/14/13 1112 03/14/13 1255 03/14/13 1953 03/14/13 2224 03/15/13 0635 03/16/13 0551  HGB 12.7*  --   --   --   --  13.2 12.5*  HCT 36.7*  --   --   --   --  37.4* 36.4*  PLT 132*  --   --   --   --  130* 139*  LABPROT  --  14.1  --   --   --  14.2  --   INR  --  1.11  --   --   --  1.12  --   HEPARINUNFRC  --   --   --   --  0.36 0.34 0.50  CREATININE 1.06  --   --   --   --  1.10  --   TROPONINI <0.30  --  2.34* 2.59* 2.61*  --   --     Estimated Creatinine Clearance: 89.5 ml/min (by C-G formula based on Cr of 1.1).   Medications:  Heparin 1400 units/hr  Assessment: 62 y/o M on heparin for CP, hx of afib/flutter. Cath planned for Monday per cardiology note. Renal function stable. H/H and platelets stable. Heparin Level remains therapeutic this morning. No signs of bleeding noted.   Goal of Therapy:  Heparin level 0.3-0.7 units/ml Monitor platelets by anticoagulation protocol: Yes   Plan:  -Continue heparin at 1400 units/hr -Daily CBC/HL -F/U restart oral anticoagulant  Thank you for allowing me to take part in this patient's care, Piedad Climes, PharmD Clinical Pharmacist - Resident Pager: [phone] Pharmacy: [phone] 03/16/2013 8:25 AM

## 2013-03-16 NOTE — Progress Notes (Signed)
Patient ID: [name] Cook, male   DOB: [birth year], 62 y.o.   MRN: [id number]    Subjective:  Denies CP or dyspnea No palpitations tolerating heparin    Objective:  Filed Vitals:   03/15/13 1208 03/15/13 2000 03/15/13 2053 03/16/13 0611  BP: 126/69  122/71 137/84  Pulse: 76  80 82  Temp: 98.1 F (36.7 C)  97.9 F (36.6 C) 98 F (36.7 C)  TempSrc: Oral  Oral Oral  Resp:   18 18  Height:      Weight:    231 lb 6.4 oz (104.962 kg)  SpO2: 98% 98% 98% 98%    Intake/Output from previous day:  Intake/Output Summary (Last 24 hours) at 03/16/13 0830 Last data filed at 03/16/13 0600  Gross per 24 hour  Intake    728 ml  Output   1900 ml  Net  -1172 ml    Physical Exam: Physical exam: Well-developed well-nourished in no acute distress.  Skin is warm and dry.  HEENT is normal.  Neck is supple. No thyromegaly.  Chest is clear to auscultation with normal expansion.  Cardiovascular exam is regular rate and rhythm.  Abdominal exam nontender or distended. No masses palpated. Extremities show no edema. neuro grossly intact  Telemetry:  NSR no VT or arrhythmia    Lab Results: Basic Metabolic Panel:  Recent Labs  53/66/44 0538 03/15/13 0635  NA 138 141  K 4.0 4.1  CL 105 105  CO2 24 25  GLUCOSE 170* 128*  BUN 22 16  CREATININE 1.06 1.10  CALCIUM 9.0 8.6   CBC:  Recent Labs  03/14/13 0538 03/15/13 0635 03/16/13 0551  WBC 4.6 6.2 5.5  NEUTROABS 2.4  --   --   HGB 12.7* 13.2 12.5*  HCT 36.7* 37.4* 36.4*  MCV 92.7 92.6 92.4  PLT 132* 130* 139*   Cardiac Enzymes:  Recent Labs  03/14/13 1255 03/14/13 1953 03/14/13 2224  TROPONINI 2.34* 2.59* 2.61*   Scheduled Meds: . aspirin  81 mg Oral Daily  . [START ON 03/17/2013] aspirin  81 mg Oral Pre-Cath  . atorvastatin  40 mg Oral q1800  . celecoxib  200 mg Oral Daily  . diazepam  2 mg Oral On Call  . linagliptin  5 mg Oral Daily  . lisinopril  10 mg Oral Daily  . metoprolol succinate  100 mg Oral Daily  .  mometasone-formoterol  2 puff Inhalation BID  . pantoprazole  40 mg Oral Daily  . sodium chloride  3 mL Intravenous Q12H  . sodium chloride  3 mL Intravenous Q12H   Continuous Infusions: . [START ON 03/17/2013] sodium chloride    . heparin 1,400 Units/hr (03/15/13 2325)   PRN Meds:.sodium chloride, sodium chloride, acetaminophen, nitroGLYCERIN, ondansetron (ZOFRAN) IV, sodium chloride, sodium chloride   Assessment/Plan:  1 NSTEMI- patient has ruled in; continue ASA, heparin, statin, metoprolol; proceed with cath Monday (risks and benefits discussed and patient agrees to proceed). He had femoral artery bleed with last cath Would be nice to do radially  Plus 4 pulse in right radial   2 dyspnea-etiology unclear. Pro BNP mildly elevated; ? Anginal equivalent. Plan hold lasix prior to cath. 3 paroxysmal atrial fibrillation-patient is status post pacemaker placement with AV node ablation. Coumadin on hold prior to cath; continue heparin. 4 hypertension-continue preadmission medications.  Should stop celebrex in setting of SEMI  5 diabetes mellitus-follow CBGs. Hold glucophage. 6 hyperlipidemia-continue statin.   Charlton Haws 03/16/2013, 8:30 AM

## 2013-03-17 ENCOUNTER — Encounter (HOSPITAL_COMMUNITY)
Admission: EM | Disposition: A | Discharge status: Home / Self Care | Payer: Self-pay | Source: Home / Self Care | Attending: Cardiology

## 2013-03-17 DIAGNOSIS — I2510 Atherosclerotic heart disease of native coronary artery without angina pectoris: Secondary | ICD-10-CM

## 2013-03-17 HISTORY — PX: LEFT HEART CATHETERIZATION WITH CORONARY ANGIOGRAM: SHX5451

## 2013-03-17 LAB — CBC
HCT: 36.6 % — ABNORMAL LOW (ref 39.0–52.0)
Hemoglobin: 12.9 g/dL — ABNORMAL LOW (ref 13.0–17.0)
MCH: 32.7 pg (ref 26.0–34.0)
MCHC: 35.2 g/dL (ref 30.0–36.0)
MCV: 92.9 fL (ref 78.0–100.0)
Platelets: 160 10*3/uL (ref 150–400)
RBC: 3.94 MIL/uL — ABNORMAL LOW (ref 4.22–5.81)
RDW: 14.4 % (ref 11.5–15.5)
WBC: 6 10*3/uL (ref 4.0–10.5)

## 2013-03-17 LAB — GLUCOSE, CAPILLARY
Glucose-Capillary: 95 mg/dL (ref 70–99)
Glucose-Capillary: 81 mg/dL (ref 70–99)
Glucose-Capillary: 121 mg/dL — ABNORMAL HIGH (ref 70–99)
Glucose-Capillary: 137 mg/dL — ABNORMAL HIGH (ref 70–99)

## 2013-03-17 LAB — HEPARIN LEVEL (UNFRACTIONATED): Heparin Unfractionated: 0.35 IU/mL (ref 0.30–0.70)

## 2013-03-17 SURGERY — LEFT HEART CATHETERIZATION WITH CORONARY ANGIOGRAM
Anesthesia: LOCAL

## 2013-03-17 MED ORDER — VERAPAMIL HCL 2.5 MG/ML IV SOLN
INTRAVENOUS | Status: AC
  Filled 2013-03-17: qty 2

## 2013-03-17 MED ORDER — ISOSORBIDE MONONITRATE ER 30 MG PO TB24
30.0000 mg | ORAL_TABLET | Freq: Every day | ORAL | Status: DC
  Administered 2013-03-17 – 2013-03-18 (×2): 30 mg via ORAL
  Filled 2013-03-17 (×2): qty 1

## 2013-03-17 MED ORDER — LIDOCAINE HCL (PF) 1 % IJ SOLN
INTRAMUSCULAR | Status: AC
  Filled 2013-03-17: qty 30

## 2013-03-17 MED ORDER — ACETAMINOPHEN 325 MG PO TABS: 650.0000 mg | ORAL_TABLET | ORAL | Status: DC | PRN

## 2013-03-17 MED ORDER — HEPARIN SODIUM (PORCINE) 1000 UNIT/ML IJ SOLN
INTRAMUSCULAR | Status: AC
  Filled 2013-03-17: qty 1

## 2013-03-17 MED ORDER — ONDANSETRON HCL 4 MG/2ML IJ SOLN: 4.0000 mg | Freq: Four times a day (QID) | INTRAMUSCULAR | Status: DC | PRN

## 2013-03-17 MED ORDER — HEPARIN (PORCINE) IN NACL 2-0.9 UNIT/ML-% IJ SOLN
INTRAMUSCULAR | Status: AC
  Filled 2013-03-17: qty 1000

## 2013-03-17 MED ORDER — CLOPIDOGREL BISULFATE 75 MG PO TABS
300.0000 mg | ORAL_TABLET | Freq: Once | ORAL | Status: AC
  Administered 2013-03-17: 300 mg via ORAL
  Filled 2013-03-17: qty 4

## 2013-03-17 MED ORDER — NITROGLYCERIN 0.2 MG/ML ON CALL CATH LAB
INTRAVENOUS | Status: AC
  Filled 2013-03-17: qty 1

## 2013-03-17 MED ORDER — MIDAZOLAM HCL 2 MG/2ML IJ SOLN
INTRAMUSCULAR | Status: AC
  Filled 2013-03-17: qty 2

## 2013-03-17 MED ORDER — FENTANYL CITRATE 0.05 MG/ML IJ SOLN
INTRAMUSCULAR | Status: AC
  Filled 2013-03-17: qty 2

## 2013-03-17 MED ORDER — SODIUM CHLORIDE 0.9 % IV SOLN: INTRAVENOUS | Status: AC

## 2013-03-17 MED ORDER — CLOPIDOGREL BISULFATE 75 MG PO TABS
75.0000 mg | ORAL_TABLET | Freq: Every day | ORAL | Status: DC
  Administered 2013-03-18: 75 mg via ORAL
  Filled 2013-03-17: qty 1

## 2013-03-17 MED ORDER — HEART ATTACK BOUNCING BOOK
Freq: Once | Status: AC
  Administered 2013-03-17: 21:00:00
  Filled 2013-03-17: qty 1

## 2013-03-17 NOTE — Interval H&P Note (Signed)
History and Physical Interval Note:  03/17/2013 10:10 AM  Rigoberto Noel  has presented today for cardiac cath  with the diagnosis of NSTEMI.  The various methods of treatment have been discussed with the patient and family. After consideration of risks, benefits and other options for treatment, the patient has consented to  Procedure(s): LEFT HEART CATHETERIZATION WITH CORONARY ANGIOGRAM (N/A) as a surgical intervention .  The patient's history has been reviewed, patient examined, no change in status, stable for surgery.  I have reviewed the patient's chart and labs.  Questions were answered to the patient's satisfaction.    Cath Lab Visit (complete for each Cath Lab visit)  Clinical Evaluation Leading to the Procedure:   ACS: yes  Non-ACS:    Anginal Classification: CCS IV  Anti-ischemic medical therapy: Minimal Therapy (1 class of medications)  Non-Invasive Test Results: No non-invasive testing performed  Prior CABG: No previous CABG         MCALHANY,CHRISTOPHER

## 2013-03-17 NOTE — CV Procedure (Signed)
Cardiac Catheterization Operative Report  [name] Cook [id number] 12/1/201411:10 AM No primary provider on file.  Procedure Performed:  1. Left Heart Catheterization 2. Selective Coronary Angiography 3. Left ventricular angiogram  Operator: Verne Carrow, MD  Arterial access site:  Right radial artery.   Indication:  62 yo male with history of CAD, DM, HTN, HLD, atrial fibrillation s/p AV node ablation and pacemaker admitted with chest pain and ruled in for MI with elevated troponin.                                    Procedure Details: The risks, benefits, complications, treatment options, and expected outcomes were discussed with the patient. The patient and/or family concurred with the proposed plan, giving informed consent. The patient was brought to the cath lab after IV hydration was begun and oral premedication was given. The patient was further sedated with Versed and Fentanyl. The right wrist was assessed with an Allens test which was positive. The right wrist was prepped and draped in a sterile fashion. 1% lidocaine was used for local anesthesia. Using the modified Seldinger access technique, a 5/6 Fr slender sheath was placed in the right radial artery. 3 mg Verapamil was given through the sheath. 4000 units IV heparin was given. Standard diagnostic catheters were used to perform selective coronary angiography. A pigtail catheter was used to perform a left ventricular angiogram. The sheath was removed from the right radial artery and a Terumo hemostasis band was applied at the arteriotomy site on the right wrist.   There were no immediate complications. The patient was taken to the recovery area in stable condition.   Hemodynamic Findings: Central aortic pressure: 135/80 Left ventricular pressure: 130/9/24  Angiographic Findings:  Left main: 30% distal stenosis.   Left Anterior Descending Artery: Large caliber vessel that courses to the apex. There is mild  plaque in the mid LAD. The first diagonal branch is a moderate caliber, bifurcating vessel with complex proximal disease and the likely culprit for his MI. The proximal segment has diffuse 70% stenosis just before the bifurcation. 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 The inferior sub-branch has a 80% stenosis just beyond the bifurcation followed by a 99% stenosis and diffuse 50% stenosis throughout the remainder of the vessel. This vessel is not favorable for PCI.   Circumflex Artery: Moderate caliber vessel with small first obtuse marginal branch. The proximal and mid AV groove Circumflex has mild plaque.   Right Coronary Artery: Very large, dominant vessel with 50% mid stenosis, 50% distal stenosis. The posterolateral branch is large and has mild plaque disease. The PDA is large and has mild plaque.   Left Ventricular Angiogram: LVEF=65%.   Impression: 1. NSTEMI secondary to total occlusion of the small caliber superior branch of the bifurcating diagonal branch. Not a favorable vessel for PCI given an aneurysmal segment and the involvement of a small caliber inferior sub-branch that is diffusely diseased.  2. Moderate non-obstructive disease RCA 3. Mild plaque in the left main, LAD and Circumflex.  4. Preserved LV systolic function  Recommendations: Would treat with single anti-platelet therapy (Plavix) given acute coronary syndrome. Would not use ASA as he will be restarted on coumadin tomorrow. Medical management of CAD with beta blocker, statin, Ace-inh. Will start long acting nitrate (Imdur 30 mg po Qdaily). If he has continued life-style  limiting angina, could consider complex PCI of the small to moderate caliber diagonal branch but as described above, this would be a complex procedure given the diffuse disease in the inferior sub-branch, total occlusion of the superior sub-branch and aneurysmal segment.          Complications:  None. The patient tolerated the procedure well.

## 2013-03-17 NOTE — Progress Notes (Signed)
ANTICOAGULATION CONSULT NOTE - Follow Up Consult  Pharmacy Consult for Heparin  Indication: chest pain/ACS and atrial fibrillation  No Known Allergies  Patient Measurements: Height: 6' 0.05" (183 cm) Weight: 231 lb 7.7 oz (105 kg) IBW/kg (Calculated) : 77.71 Heparin Dosing Weight: ~99 kg  Vital Signs: Temp: 97.4 F (36.3 C) (12/01 0518) Temp src: Oral (12/01 0518) BP: 126/86 mmHg (12/01 0518) Pulse Rate: 82 (12/01 0518)  Labs:  Recent Labs  03/14/13 1112 03/14/13 1255 03/14/13 1953  03/14/13 2224  03/15/13 0635 03/16/13 0551 03/17/13 0550  HGB  --   --   --   --   --   < > 13.2 12.5* 12.9*  HCT  --   --   --   --   --   --  37.4* 36.4* 36.6*  PLT  --   --   --   --   --   --  130* 139* 160  LABPROT 14.1  --   --   --   --   --  14.2  --   --   INR 1.11  --   --   --   --   --  1.12  --   --   HEPARINUNFRC  --   --   --   < > 0.36  --  0.34 0.50 0.35  CREATININE  --   --   --   --   --   --  1.10  --   --   TROPONINI  --  2.34* 2.59*  --  2.61*  --   --   --   --   < > = values in this interval not displayed.  Estimated Creatinine Clearance: 89.5 ml/min (by C-G formula based on Cr of 1.1).   Medications:  Heparin 1400 units/hr  Assessment: 62 y/o M on heparin for CP, hx of afib/flutter. Heparin level is therapeutic on current rate.  H/H and platelets stable. Noted plans for cardiac cath today.  Goal of Therapy:  Heparin level 0.3-0.7 units/ml Monitor platelets by anticoagulation protocol: Yes   Plan:  -Continue heparin at 1400 units/hr -Daily CBC and heparin level -F/U after cath -F/U restart oral anticoagulant  Shyrl Obi, Pharm.D., BCPS Clinical Pharmacist Pager [phone] 03/17/2013 10:12 AM

## 2013-03-17 NOTE — H&P (View-Only) (Signed)
Patient ID: [name] Cook, male   DOB: 09/07/1950, 62 y.o.   MRN: [id number]    Subjective:  Denies CP or dyspnea No palpitations tolerating heparin    Objective:  Filed Vitals:   03/15/13 1208 03/15/13 2000 03/15/13 2053 03/16/13 0611  BP: 126/69  122/71 137/84  Pulse: 76  80 82  Temp: 98.1 F (36.7 C)  97.9 F (36.6 C) 98 F (36.7 C)  TempSrc: Oral  Oral Oral  Resp:   18 18  Height:      Weight:    231 lb 6.4 oz (104.962 kg)  SpO2: 98% 98% 98% 98%    Intake/Output from previous day:  Intake/Output Summary (Last 24 hours) at 03/16/13 0830 Last data filed at 03/16/13 0600  Gross per 24 hour  Intake    728 ml  Output   1900 ml  Net  -1172 ml    Physical Exam: Physical exam: Well-developed well-nourished in no acute distress.  Skin is warm and dry.  HEENT is normal.  Neck is supple. No thyromegaly.  Chest is clear to auscultation with normal expansion.  Cardiovascular exam is regular rate and rhythm.  Abdominal exam nontender or distended. No masses palpated. Extremities show no edema. neuro grossly intact  Telemetry:  NSR no VT or arrhythmia    Lab Results: Basic Metabolic Panel:  Recent Labs  03/14/13 0538 03/15/13 0635  NA 138 141  K 4.0 4.1  CL 105 105  CO2 24 25  GLUCOSE 170* 128*  BUN 22 16  CREATININE 1.06 1.10  CALCIUM 9.0 8.6   CBC:  Recent Labs  03/14/13 0538 03/15/13 0635 03/16/13 0551  WBC 4.6 6.2 5.5  NEUTROABS 2.4  --   --   HGB 12.7* 13.2 12.5*  HCT 36.7* 37.4* 36.4*  MCV 92.7 92.6 92.4  PLT 132* 130* 139*   Cardiac Enzymes:  Recent Labs  03/14/13 1255 03/14/13 1953 03/14/13 2224  TROPONINI 2.34* 2.59* 2.61*   Scheduled Meds: . aspirin  81 mg Oral Daily  . [START ON 03/17/2013] aspirin  81 mg Oral Pre-Cath  . atorvastatin  40 mg Oral q1800  . celecoxib  200 mg Oral Daily  . diazepam  2 mg Oral On Call  . linagliptin  5 mg Oral Daily  . lisinopril  10 mg Oral Daily  . metoprolol succinate  100 mg Oral Daily  .  mometasone-formoterol  2 puff Inhalation BID  . pantoprazole  40 mg Oral Daily  . sodium chloride  3 mL Intravenous Q12H  . sodium chloride  3 mL Intravenous Q12H   Continuous Infusions: . [START ON 03/17/2013] sodium chloride    . heparin 1,400 Units/hr (03/15/13 2325)   PRN Meds:.sodium chloride, sodium chloride, acetaminophen, nitroGLYCERIN, ondansetron (ZOFRAN) IV, sodium chloride, sodium chloride   Assessment/Plan:  1 NSTEMI- patient has ruled in; continue ASA, heparin, statin, metoprolol; proceed with cath Monday (risks and benefits discussed and patient agrees to proceed). He had femoral artery bleed with last cath Would be nice to do radially  Plus 4 pulse in right radial   2 dyspnea-etiology unclear. Pro BNP mildly elevated; ? Anginal equivalent. Plan hold lasix prior to cath. 3 paroxysmal atrial fibrillation-patient is status post pacemaker placement with AV node ablation. Coumadin on hold prior to cath; continue heparin. 4 hypertension-continue preadmission medications.  Should stop celebrex in setting of SEMI  5 diabetes mellitus-follow CBGs. Hold glucophage. 6 hyperlipidemia-continue statin.   Olanrewaju Osborn 03/16/2013, 8:30 AM    

## 2013-03-17 NOTE — Progress Notes (Signed)
Right radial TR band removed without complications, right radial level 0 without bruising or hematoma.  +Reverese Allen's test for good blood flow.  Patient given radial care instructions.  2x2 and tegaderm applied.  Will continue to monitor.  [name] Cook

## 2013-03-18 LAB — CBC
HCT: 34.6 % — ABNORMAL LOW (ref 39.0–52.0)
Hemoglobin: 12.1 g/dL — ABNORMAL LOW (ref 13.0–17.0)
MCH: 32.7 pg (ref 26.0–34.0)
MCHC: 35 g/dL (ref 30.0–36.0)
MCV: 93.5 fL (ref 78.0–100.0)
Platelets: 125 10*3/uL — ABNORMAL LOW (ref 150–400)
RBC: 3.7 MIL/uL — ABNORMAL LOW (ref 4.22–5.81)
RDW: 14.6 % (ref 11.5–15.5)
WBC: 4.9 10*3/uL (ref 4.0–10.5)

## 2013-03-18 LAB — GLUCOSE, CAPILLARY: Glucose-Capillary: 118 mg/dL — ABNORMAL HIGH (ref 70–99)

## 2013-03-18 MED ORDER — CLOPIDOGREL BISULFATE 75 MG PO TABS: 75.0000 mg | ORAL_TABLET | Freq: Every day | ORAL | Status: DC

## 2013-03-18 MED ORDER — ISOSORBIDE MONONITRATE ER 30 MG PO TB24: 30.0000 mg | ORAL_TABLET | Freq: Every day | ORAL | Status: DC

## 2013-03-18 NOTE — Progress Notes (Signed)
1020-1100 Pt off monitor for discharge. Has walked independently per pt without CP. MI education completed with pt voicing understanding. Pt has quit smoking in 2013. Reviewed diabetic diet and counting carbs. Pt has not been checking sugars consistently at home but states he needs to do better. Gave written ex ed. Discussed CRP 2 and pt gave permission to refer to [place] program. Duanne Limerick, RN BSN 03/18/2013 10:58 AM

## 2013-03-18 NOTE — Discharge Summary (Signed)
CARDIOLOGY DISCHARGE SUMMARY    Patient ID: [name] Cook,  MRN: [id number], DOB/AGE: July 22, 1950 62 y.o.  Admit date: 03/14/2013 Discharge date: 03/20/2013  Primary Care Physician: No primary provider on file Primary Cardiologist: [name], MD  Primary Discharge Diagnosis:  1. CAD / NSTEMI  Secondary Discharge Diagnoses:  1. Persistent AF s/p PPM implant +AV node ablation 2. Atrial flutter s/p ablation 3. DM 3. HTN 4. Dyslipidemia  5. COPD 6. GERD  Procedures This Admission:  1. 2D echocardiogram 03/15/2013 Echocardiogram:  Study Conclusions - Left ventricle: Distal septal, apical and inferior wall hypokinesis Distal septal and apical hypokinesis The cavity size was mildly dilated. Systolic function was mildly reduced. The estimated ejection fraction was in the range of 45% to 50%. Wall motion was normal; there were no regional wall motion abnormalities. - Left atrium: The atrium was mildly dilated. - Atrial septum: No defect or patent foramen ovale was identified. - Pericardium, extracardiac: A trivial pericardial effusion was identified.  1. Cardiac catheterization 03/17/2013 Hemodynamic Findings:  Central aortic pressure: 135/80  Left ventricular pressure: 130/9/24  Angiographic Findings:  Left main: 30% distal stenosis.  Left Anterior Descending Artery: Large caliber vessel that courses to the apex. There is mild plaque in the mid LAD. The first diagonal branch is a moderate caliber, bifurcating vessel with complex proximal disease and the likely culprit for his MI. The proximal segment has diffuse 70% stenosis just before the bifurcation. 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 The inferior sub-branch has a 80% stenosis just beyond the bifurcation followed by a 99% stenosis and diffuse 50% stenosis throughout the remainder of the vessel. This vessel is not favorable for PCI.  Circumflex Artery:  Moderate caliber vessel with small first obtuse marginal branch. The proximal and mid AV groove Circumflex has mild plaque.  Right Coronary Artery: Very large, dominant vessel with 50% mid stenosis, 50% distal stenosis. The posterolateral branch is large and has mild plaque disease. The PDA is large and has mild plaque.  Left Ventricular Angiogram: LVEF=65%.  Impression:  1. NSTEMI secondary to total occlusion of the small caliber superior branch of the bifurcating diagonal branch. Not a favorable vessel for PCI given an aneurysmal segment and the involvement of a small caliber inferior sub-branch that is diffusely diseased.  2. Moderate non-obstructive disease RCA  3. Mild plaque in the left main, LAD and Circumflex.  4. Preserved LV systolic function  Recommendations: Would treat with single anti-platelet therapy (Plavix) given acute coronary syndrome. Would not use ASA as he will be restarted on coumadin tomorrow. Medical management of CAD with beta blocker, statin, Ace-inh. Will start long acting nitrate (Imdur 30 mg po Qdaily). If he has continued life-style limiting angina, could consider complex PCI of the small to moderate caliber diagonal branch but as described above, this would be a complex procedure given the diffuse disease in the inferior sub-branch, total occlusion of the superior sub-branch and aneurysmal segment.   History: [name] Cook is a 62 year old man with CAD, diabetes mellitus, hypertension, hyperlipidemia, COPD, previous atrial flutter ablation and recurrent atrial fibrillation who presented on 03/14/2013 for evaluation of chest pain and dyspnea. He had a cardiac catheterization in 2001 demonstrating a 50% circumflex lesion. Last echocardiogram in January of 2013 showed normal LV function, mild left atrial enlargement. Nuclear study in September of 2013 showed an ejection fraction of 60% and no ischemia or infarction. [name] Cook has had problems with recurrent atrial  fibrillation. He  had increased liver functions with amiodarone. Tikosyn therapy unsuccessful. He had nausea with Multaq and this was discontinued. In October 2014 he underwent pacemaker placement and ablation of his AV node. He presented on 03/14/2013 with complaints of exertional dyspnea and chest pressure. His ECG did not show any acute ischemic changes. His initial troponin was negative.   Hospital Course:  [name] Cook was admitted to telemetry and ruled in for NSTEMI. Peak troponin 2.61. Coumadin was held and he was started on IV heparin (INR 1.1 on admission). He was continued on ASA, BB and statin. On 03/17/2013 he underwent cardiac catheterization with results as outlined above. He has preserved LV function. He tolerated the procedure well without complication. His right wrist site site remained intact without significant bleeding or hematoma. Medical management for CAD was recommended and he was started on Plavix and Imdur. Telemetry revealed NSR without arrhythmia. He remained hemodynamically stable and is ambulating without difficulty. Today he has been seen, examined and deemed stable for discharge home by Dr. [name].  Discharge Vitals: Blood pressure 111/82, pulse 81, temperature 97.8 F (36.6 C), temperature source Oral, resp. rate 18, height 6' 0.05" (1.83 m), weight 233 lb 12.8 oz (106.051 kg), SpO2 98.00%.   Labs: Lab Results  Component Value Date   WBC 4.9 03/18/2013   HGB 12.1* 03/18/2013   HCT 34.6* 03/18/2013   MCV 93.5 03/18/2013   PLT 125* 03/18/2013     Recent Labs Lab 03/15/13 0635  NA 141  K 4.1  CL 105  CO2 25  BUN 16  CREATININE 1.10  CALCIUM 8.6  GLUCOSE 128*   Lab Results  Component Value Date   CKTOTAL 63 04/01/2011   CKMB 2.3 04/01/2011   TROPONINI 2.61* 03/14/2013   Disposition:  The patient is being discharged in stable condition.  Follow-up:     Follow-up Information   Follow up with [facility] On 03/21/2013. (At 8:30 AM for follow-up with Dr.  Ladona Ridgel, treadmill test and Coumadin follow-up)    Specialty:  Cardiology   Contact information:   [address] [place] [address] [phone]     Discharge Medications:    Medication List         celecoxib 200 MG capsule  Commonly known as:  CELEBREX  Take 200 mg by mouth daily.     clopidogrel 75 MG tablet  Commonly known as:  PLAVIX  Take 1 tablet (75 mg total) by mouth daily with breakfast.     fish oil-omega-3 fatty acids 1000 MG capsule  Take 1 g by mouth daily.     Fluticasone-Salmeterol 250-50 MCG/DOSE Aepb  Commonly known as:  ADVAIR  Inhale 2 puffs into the lungs every 12 (twelve) hours.     furosemide 20 MG tablet  Commonly known as:  LASIX  Take 1 tablet (20 mg total) by mouth daily.     isosorbide mononitrate 30 MG 24 hr tablet  Commonly known as:  IMDUR  Take 1 tablet (30 mg total) by mouth daily.     lisinopril 10 MG tablet  Commonly known as:  PRINIVIL,ZESTRIL  Take 1 tablet (10 mg total) by mouth daily.     metoprolol succinate 50 MG 24 hr tablet  Commonly known as:  TOPROL-XL  Take 2 tablets (100 mg total) by mouth daily. Take with or immediately following a meal.     nitroGLYCERIN 0.4 MG SL tablet  Commonly known as:  NITROSTAT  Place 0.4 mg  under the tongue every 5 (five) minutes as needed for chest pain.     pantoprazole 40 MG tablet  Commonly known as:  PROTONIX  Take 40 mg by mouth daily.     rosuvastatin 20 MG tablet  Commonly known as:  CRESTOR  Take 20 mg by mouth at bedtime.     sitaGLIPtin-metformin 50-1000 MG per tablet  Commonly known as:  JANUMET  Take 1 tablet by mouth 2 (two) times daily with a meal.     warfarin 5 MG tablet  Commonly known as:  COUMADIN  Take 2.5-5 mg by mouth daily at 6 PM. 2.5 mg on M W F, and 5 mg all other days       Duration of Discharge Encounter: Greater than 30 minutes including physician time.  Signed, Rick Duff, PA-C 03/20/2013, 5:06 PM

## 2013-03-18 NOTE — Progress Notes (Signed)
Reviewed discharge instructions with patient and he stated his understanding.  Discharged home with family.  [name] Cook  

## 2013-03-18 NOTE — Progress Notes (Signed)
Patient: [name] Cook Date of Encounter: 03/18/2013, 8:14 AM Admit date: 03/14/2013     Subjective  [name] Cook has no new complaints. He is eager to go home.   Objective  Physical Exam: Vitals: BP 111/82  Pulse 81  Temp(Src) 97.8 F (36.6 C) (Oral)  Resp 18  Ht 6' 0.05" (1.83 m)  Wt 233 lb 12.8 oz (106.051 kg)  BMI 31.67 kg/m2  SpO2 98% General: Well developed, well appearing 62 year old male in no acute distress. Neck: Supple. JVD not elevated. Lungs: Clear bilaterally to auscultation without wheezes, rales, or rhonchi. Breathing is unlabored. Heart: RRR S1 S2 without murmurs, rubs, or gallops.  Abdomen: Soft, non-distended. Extremities: No clubbing or cyanosis. No edema.  Distal pedal pulses are 2+ and equal bilaterally. Right wrist site intact without hematoma. Neuro: Alert and oriented X 3. Moves all extremities spontaneously. No focal deficits.  Intake/Output:  Intake/Output Summary (Last 24 hours) at 03/18/13 0814 Last data filed at 03/17/13 1700  Gross per 24 hour  Intake 1427.2 ml  Output    600 ml  Net  827.2 ml    Inpatient Medications:  . atorvastatin  40 mg Oral q1800  . celecoxib  200 mg Oral Daily  . clopidogrel  75 mg Oral Q breakfast  . isosorbide mononitrate  30 mg Oral Daily  . linagliptin  5 mg Oral Daily  . lisinopril  10 mg Oral Daily  . metoprolol succinate  100 mg Oral Daily  . mometasone-formoterol  2 puff Inhalation BID  . pantoprazole  40 mg Oral Daily  . sodium chloride  3 mL Intravenous Q12H    Labs: Troponin 0.30 >> 2.34 >> 2.59 >> 2.61    Component Value Date/Time   NA 141 03/15/2013 0635   K 4.1 03/15/2013 0635   CL 105 03/15/2013 0635   CO2 25 03/15/2013 0635   GLUCOSE 128* 03/15/2013 0635   BUN 16 03/15/2013 0635   CREATININE 1.10 03/15/2013 0635   CREATININE 1.16 07/15/2012 1215   CALCIUM 8.6 03/15/2013 0635   GFRNONAA 71* 03/15/2013 0635   GFRAA 82* 03/15/2013 0635    Recent Labs  03/17/13 0550 03/18/13 0516  WBC  6.0 4.9  HGB 12.9* 12.1*  HCT 36.6* 34.6*  MCV 92.9 93.5  PLT 160 125*    Radiology/Studies: Dg Chest Port 1 View  03/14/2013   CLINICAL DATA:  Chest pain and shortness of breath. History of atrial fibrillation.  EXAM: PORTABLE CHEST - 1 VIEW  COMPARISON:  01/25/2013  FINDINGS: Stable appearance of cardiac pacemaker. Normal heart size and pulmonary vascularity. No focal airspace disease in the lungs. No blunting of costophrenic angles. No pneumothorax.  IMPRESSION: No active disease.   Electronically Signed   By: Burman Nieves M.D.   On: 03/14/2013 05:41   Cardiac catheterization: Hemodynamic Findings:  Central aortic pressure: 135/80  Left ventricular pressure: 130/9/24  Angiographic Findings:  Left main: 30% distal stenosis.  Left Anterior Descending Artery: Large caliber vessel that courses to the apex. There is mild plaque in the mid LAD. The first diagonal branch is a moderate caliber, bifurcating vessel with complex proximal disease and the likely culprit for his MI. The proximal segment has diffuse 70% stenosis just before the bifurcation. 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 The inferior sub-branch has a 80% stenosis just beyond the bifurcation followed by a 99% stenosis and diffuse 50% stenosis throughout the remainder of  the vessel. This vessel is not favorable for PCI.  Circumflex Artery: Moderate caliber vessel with small first obtuse marginal branch. The proximal and mid AV groove Circumflex has mild plaque.  Right Coronary Artery: Very large, dominant vessel with 50% mid stenosis, 50% distal stenosis. The posterolateral branch is large and has mild plaque disease. The PDA is large and has mild plaque.  Left Ventricular Angiogram: LVEF=65%.  Impression:  1. NSTEMI secondary to total occlusion of the small caliber superior branch of the bifurcating diagonal branch. Not a favorable vessel for PCI given an  aneurysmal segment and the involvement of a small caliber inferior sub-branch that is diffusely diseased.  2. Moderate non-obstructive disease RCA  3. Mild plaque in the left main, LAD and Circumflex.  4. Preserved LV systolic function  Recommendations: Would treat with single anti-platelet therapy (Plavix) given acute coronary syndrome. Would not use ASA as he will be restarted on coumadin tomorrow. Medical management of CAD with beta blocker, statin, Ace-inh. Will start long acting nitrate (Imdur 30 mg po daily). If he has continued life-style limiting angina, could consider complex PCI of the small to moderate caliber diagonal branch but as described above, this would be a complex procedure given the diffuse disease in the inferior sub-branch, total occlusion of the superior sub-branch and aneurysmal segment.   Echocardiogram: Study Conclusions - Left ventricle: Distal septal, apical and inferior wall hypokinesis Distal septal and apical hypokinesis The cavity size was mildly dilated. Systolic function was mildly reduced. The estimated ejection fraction was in the range of 45% to 50%. Wall motion was normal; there were no regional wall motion abnormalities. - Left atrium: The atrium was mildly dilated. - Atrial septum: No defect or patent foramen ovale was identified. - Pericardium, extracardiac: A trivial pericardial effusion was identified.  Telemetry: SR; no arrhythmias   Assessment and Plan  [name] Cook is a 62 yo man with CAD, DM, HTN, HLD, atrial fibrillation s/p AV node ablation and pacemaker admitted with chest pain and ruled in for MI with elevated troponin. Cath yesterday showed total occlusion of the small caliber superior branch of the bifurcating diagonal branch which is not a favorable vessel for PCI given an aneurysmal segment and the involvement of a small caliber inferior sub-branch that is diffusely diseased. Moderate non-obstructive disease in RCA. Mild plaque in the left  main, LAD and circumflex. Preserved LV systolic function   - Continue medical therapy with Plavix (per Dr CM recs, no ASA), BB, ACEI, Imdur and statin - Schedule follow-up with Dr Leonia Reeves for GXT to evaluate rate response / PPM programming   Dr. Ladona Ridgel to see Signed, Exie Parody  EP Attending  Patient seen and examined. Agree with above history, physical exam, assessment and plan. Cath data reviewed. Plan is as above. He may ultimately require additional diuretic. Will follow.  Leonia Reeves.D.

## 2013-03-18 NOTE — Progress Notes (Signed)
Chart review complete.  Patient is not eligible for [facility] Care Management services because his/her PCP is not a [facility] primary care provider or is not [facility] affiliated.  For any additional questions or new referrals please contact Tim Henderson BSN RN [facility] Liaison at [phone] °

## 2013-03-20 ENCOUNTER — Encounter: Payer: Self-pay | Admitting: *Deleted

## 2013-03-20 ENCOUNTER — Other Ambulatory Visit: Payer: Self-pay | Admitting: *Deleted

## 2013-03-20 DIAGNOSIS — I10 Essential (primary) hypertension: Secondary | ICD-10-CM

## 2013-03-20 DIAGNOSIS — I4892 Unspecified atrial flutter: Secondary | ICD-10-CM

## 2013-03-20 DIAGNOSIS — I4819 Other persistent atrial fibrillation: Secondary | ICD-10-CM

## 2013-03-20 DIAGNOSIS — Z7901 Long term (current) use of anticoagulants: Secondary | ICD-10-CM

## 2013-03-20 DIAGNOSIS — I4891 Unspecified atrial fibrillation: Secondary | ICD-10-CM

## 2013-03-21 ENCOUNTER — Other Ambulatory Visit: Payer: Self-pay | Admitting: Internal Medicine

## 2013-03-21 ENCOUNTER — Ambulatory Visit (INDEPENDENT_AMBULATORY_CARE_PROVIDER_SITE_OTHER): Payer: Medicare HMO | Admitting: *Deleted

## 2013-03-21 ENCOUNTER — Encounter: Payer: Medicare HMO | Admitting: Internal Medicine

## 2013-03-21 ENCOUNTER — Ambulatory Visit (HOSPITAL_COMMUNITY)
Admit: 2013-03-21 | Discharge: 2013-03-21 | Disposition: A | Discharge status: Home / Self Care | Payer: Medicare HMO | Source: Ambulatory Visit | Attending: Internal Medicine | Admitting: Internal Medicine

## 2013-03-21 ENCOUNTER — Ambulatory Visit (INDEPENDENT_AMBULATORY_CARE_PROVIDER_SITE_OTHER): Payer: Medicare HMO | Admitting: Internal Medicine

## 2013-03-21 ENCOUNTER — Encounter: Payer: Self-pay | Admitting: Internal Medicine

## 2013-03-21 DIAGNOSIS — I4891 Unspecified atrial fibrillation: Secondary | ICD-10-CM

## 2013-03-21 DIAGNOSIS — I4819 Other persistent atrial fibrillation: Secondary | ICD-10-CM

## 2013-03-21 DIAGNOSIS — I4892 Unspecified atrial flutter: Secondary | ICD-10-CM

## 2013-03-21 DIAGNOSIS — Z7901 Long term (current) use of anticoagulants: Secondary | ICD-10-CM

## 2013-03-21 DIAGNOSIS — R5381 Other malaise: Secondary | ICD-10-CM | POA: Insufficient documentation

## 2013-03-21 DIAGNOSIS — I442 Atrioventricular block, complete: Secondary | ICD-10-CM

## 2013-03-21 DIAGNOSIS — R0602 Shortness of breath: Secondary | ICD-10-CM | POA: Insufficient documentation

## 2013-03-21 LAB — MDC_IDC_ENUM_SESS_TYPE_INCLINIC
Battery Impedance: 134 Ohm
Battery Remaining Longevity: 146 mo
Battery Voltage: 2.79 V
Brady Statistic AP VP Percent: 78.9 %
Brady Statistic AP VS Percent: 1 %
Brady Statistic AS VP Percent: 21 %
Brady Statistic AS VS Percent: 1 %
Brady Statistic RA Percent Paced: 79 %
Brady Statistic RV Percent Paced: 99.9 %
Date Time Interrogation Session: 20141205115540
Lead Channel Impedance Value: 475 Ohm
Lead Channel Impedance Value: 581 Ohm
Lead Channel Pacing Threshold Amplitude: 1 V
Lead Channel Pacing Threshold Pulse Width: 0.4 ms
Lead Channel Sensing Intrinsic Amplitude: 4 mV
Lead Channel Sensing Intrinsic Amplitude: 8 mV
Lead Channel Setting Pacing Amplitude: 1.5 V
Lead Channel Setting Pacing Amplitude: 2 V
Lead Channel Setting Pacing Pulse Width: 0.4 ms
Lead Channel Setting Sensing Sensitivity: 4 mV

## 2013-03-21 LAB — POCT INR: INR: 1.1

## 2013-03-21 NOTE — Progress Notes (Deleted)
Patient ID: [name] Cook, male   DOB: 1951/04/09, 62 y.o.   MRN: [id number] Adult Stress Test Report  03/21/2013   Requesting Physician: No primary provider on file.  Study: {noninvasive testing:14697}  Pre-test ECG: {normal/abnormal:14647}  Level of Stress:  ***% age-predicted max HR  *** METS achieved  Functional Capacity: {funct capacity:14698}  Abnormal Symptoms: {symptoms:14699}  Heart Rate Response: {hr response:14700}  BP Response:  {bp response:14701}  Baseline LVEF: Echo *** %,  Nuclear *** %  Stress ECG: {findings; ecg:14702}  Stress Imaging Report:  {findings; stress imaging:14703}   Impression:   {findings; stress test:14704}  Interpreted by:  [name] 03/21/2013

## 2013-03-21 NOTE — Progress Notes (Signed)
Stress Lab Nurses Notes - [name] Cook  Tobey Schmelzle 03/21/2013 Reason for doing test: PPM /interrogated Type of test: Regular GTX Nurse performing test: Parke Poisson, RN Nuclear Medicine Tech: Not Applicable Echo Tech: Not Applicable MD performing test: Dr. Ladona Ridgel Family MD: NPCP Test explained and consent signed: yes IV started: No IV started Symptoms: SOB  & fatigue Treatment/Intervention: None Reason test stopped: fatigue and SOB After recovery IV was: NA Patient to return to Nuc. Med at : NA Patient discharged: Home Patient's Condition upon discharge was: stable Comments: During test peak BP 153/76 & HR 91.  Recovery BP 124/83 & HR 71.  Symptoms resolved in recovery. [name] Cook T

## 2013-03-23 NOTE — Discharge Summary (Signed)
EP Attending  Patient seen and examined. Agree with above exam, assessment and plan. Will plan early followup with exercise treadmill testing to assess chronotropic  Response to exercise.  Leonia Reeves.D.

## 2013-03-24 ENCOUNTER — Encounter: Payer: Medicare HMO | Admitting: Internal Medicine

## 2013-03-27 ENCOUNTER — Ambulatory Visit (INDEPENDENT_AMBULATORY_CARE_PROVIDER_SITE_OTHER): Payer: Medicare HMO | Admitting: *Deleted

## 2013-03-27 DIAGNOSIS — I4891 Unspecified atrial fibrillation: Secondary | ICD-10-CM

## 2013-03-27 DIAGNOSIS — I4892 Unspecified atrial flutter: Secondary | ICD-10-CM

## 2013-03-27 DIAGNOSIS — I4819 Other persistent atrial fibrillation: Secondary | ICD-10-CM

## 2013-03-27 DIAGNOSIS — Z7901 Long term (current) use of anticoagulants: Secondary | ICD-10-CM

## 2013-03-27 LAB — POCT INR: INR: 2.2

## 2013-03-31 LAB — HEPATIC FUNCTION PANEL
ALT: 19 U/L (ref 0–53)
AST: 16 U/L (ref 0–37)
Albumin: 4.1 g/dL (ref 3.5–5.2)
Alkaline Phosphatase: 57 U/L (ref 39–117)
Bilirubin, Direct: 0.1 mg/dL (ref 0.0–0.3)
Indirect Bilirubin: 0.4 mg/dL (ref 0.0–0.9)
Total Bilirubin: 0.5 mg/dL (ref 0.3–1.2)
Total Protein: 6 g/dL (ref 6.0–8.3)

## 2013-03-31 LAB — BASIC METABOLIC PANEL
BUN: 17 mg/dL (ref 6–23)
CO2: 30 mEq/L (ref 19–32)
Calcium: 9.3 mg/dL (ref 8.4–10.5)
Chloride: 105 mEq/L (ref 96–112)
Creat: 1.16 mg/dL (ref 0.50–1.35)
Glucose, Bld: 128 mg/dL — ABNORMAL HIGH (ref 70–99)
Potassium: 4 mEq/L (ref 3.5–5.3)
Sodium: 140 mEq/L (ref 135–145)

## 2013-04-22 NOTE — Progress Notes (Signed)
   Patient ID: [name] Cook, male    DOB: [birth year], 63 y.o.   MRN: [id number]  HPI See below   Review of Systems    Physical Exam     Exercise Treadmill Test  Pre-Exercise Testing Evaluation Rhythm: nsr  Rate: 71   PR:  .2 QRS = .14  QT = 500 QTc:500  P axis:n/a QRS axis: n/a  ST Segments: n/a     Test  Exercise Tolerance Test Ordering MD: G. [name] Interpreting MD:G.[name]  Unique Test No: n/a Treadmill: Bruce  Indication for ETT: Evaluate heart rate response to exercise Contraindication to NTZ:GYFV   Stress Modality: treadmill  Cardiac Imaging Performed: none   Protocol: mod bruce Max BP:  172/80  Max MPHR (bpm):  160 85% MPR (bpm):  n/a  MPHR obtained (bpm):  96 % MPHR obtained: 60  Reached 85% MPHR (min:sec):  n/a Total Exercise Time (min-sec):  8:59  Workload in METS:  4.6 Borg Scale: 16  Reason ETT Terminated:  sob    ST Segment Analysis At Rest: normal ST segments - no evidence of significant ST depression With Exercise: no evidence of significant ST depression  Other Information Arrhythmia:  No Angina during ETT:  absent (0) Quality of ETT:  non-diagnostic  ETT Interpretation:  normal - no evidence of ischemia by ST analysis  Comments: Clinically negative, electrically indeterminate due to paced rhythm. Chronotropic incompetence demonstrated  Recommendations: PPM was reprogrammed in an attempt to improve heart rate response to exercise.  [name]

## 2013-04-30 ENCOUNTER — Ambulatory Visit (INDEPENDENT_AMBULATORY_CARE_PROVIDER_SITE_OTHER): Payer: Medicare HMO | Admitting: Internal Medicine

## 2013-04-30 ENCOUNTER — Encounter: Payer: Self-pay | Admitting: *Deleted

## 2013-04-30 ENCOUNTER — Ambulatory Visit (INDEPENDENT_AMBULATORY_CARE_PROVIDER_SITE_OTHER): Payer: Medicare HMO | Admitting: *Deleted

## 2013-04-30 ENCOUNTER — Encounter: Payer: Self-pay | Admitting: Internal Medicine

## 2013-04-30 VITALS — BP 132/78 | HR 97 | Ht 72.0 in | Wt 233.0 lb

## 2013-04-30 DIAGNOSIS — I495 Sick sinus syndrome: Secondary | ICD-10-CM

## 2013-04-30 DIAGNOSIS — I4819 Other persistent atrial fibrillation: Secondary | ICD-10-CM

## 2013-04-30 DIAGNOSIS — I4891 Unspecified atrial fibrillation: Secondary | ICD-10-CM

## 2013-04-30 DIAGNOSIS — Z950 Presence of cardiac pacemaker: Secondary | ICD-10-CM

## 2013-04-30 DIAGNOSIS — Z7901 Long term (current) use of anticoagulants: Secondary | ICD-10-CM

## 2013-04-30 DIAGNOSIS — I4892 Unspecified atrial flutter: Secondary | ICD-10-CM

## 2013-04-30 LAB — MDC_IDC_ENUM_SESS_TYPE_INCLINIC
Battery Impedance: 100 Ohm
Battery Remaining Longevity: 134 mo
Battery Voltage: 2.8 V
Brady Statistic AP VP Percent: 85 %
Brady Statistic AP VS Percent: 0 %
Brady Statistic AS VP Percent: 14 %
Brady Statistic AS VS Percent: 1 %
Date Time Interrogation Session: 20150114132810
Lead Channel Impedance Value: 492 Ohm
Lead Channel Impedance Value: 834 Ohm
Lead Channel Pacing Threshold Amplitude: 0.75 V
Lead Channel Pacing Threshold Pulse Width: 0.4 ms
Lead Channel Sensing Intrinsic Amplitude: 2.8 mV
Lead Channel Setting Pacing Amplitude: 2 V
Lead Channel Setting Pacing Amplitude: 3.5 V
Lead Channel Setting Pacing Pulse Width: 0.4 ms
Lead Channel Setting Sensing Sensitivity: 4 mV

## 2013-04-30 LAB — POCT INR: INR: 2.9

## 2013-04-30 NOTE — Patient Instructions (Addendum)
Your physician recommends that you schedule a follow-up appointment in: 6 months with Dr Knox Saliva will receive a reminder letter two months in advance reminding you to call and schedule your appointment. If you don't receive this letter, please contact our office.  Care link transmission on 08-01-13  Your physician recommends that you continue on your current medications as directed. Please refer to the Current Medication list given to you today.

## 2013-04-30 NOTE — Assessment & Plan Note (Signed)
His Medtronic dual-chamber device is working normally. We'll plan to recheck in several months.

## 2013-04-30 NOTE — Assessment & Plan Note (Signed)
His ventricular rate is now well-controlled, and he is pacing 99% of the time. He will continue his current medical therapy.

## 2013-04-30 NOTE — Progress Notes (Signed)
HPI [name] Cook returns today for followup. He is a very pleasant 63 year old man with a history of paroxysmal atrial fibrillation which had become persistent, associated with a very rapid ventricular response and heart rates in the 180 beats per minute. 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 He underwent AV node ablation and insertion of a dual-chamber pacemaker. In the interim he underwent exercise treadmill testing which demonstrated a blunted chronotropic response, and his pacemaker was reprogrammed making his rate response more aggressive. In the interim he is improved. He admits to being fairly sedentary because of bad weather. He denies chest pain, shortness of breath, peripheral edema, or syncope. No Known Allergies   Current Outpatient Prescriptions  Medication Sig Dispense Refill  . celecoxib (CELEBREX) 200 MG capsule Take 200 mg by mouth daily.      . clopidogrel (PLAVIX) 75 MG tablet Take 1 tablet (75 mg total) by mouth daily with breakfast.  30 tablet  6  . fish oil-omega-3 fatty acids 1000 MG capsule Take 1 g by mouth daily.        . Fluticasone-Salmeterol (ADVAIR) 250-50 MCG/DOSE AEPB Inhale 2 puffs into the lungs every 12 (twelve) hours.      . furosemide (LASIX) 20 MG tablet Take 1 tablet (20 mg total) by mouth daily.  90 tablet  3  . isosorbide mononitrate (IMDUR) 30 MG 24 hr tablet Take 1 tablet (30 mg total) by mouth daily.  30 tablet  6  . lisinopril (PRINIVIL,ZESTRIL) 10 MG tablet Take 1 tablet (10 mg total) by mouth daily.  30 tablet  11  . metoprolol succinate (TOPROL-XL) 50 MG 24 hr tablet Take 2 tablets (100 mg total) by mouth daily. Take with or immediately following a meal.  30 tablet  11  . nitroGLYCERIN (NITROSTAT) 0.4 MG SL tablet Place 0.4 mg under the tongue every 5 (five) minutes as needed for chest pain.      . pantoprazole (PROTONIX) 40 MG tablet Take 40 mg by mouth daily.      . rosuvastatin  (CRESTOR) 20 MG tablet Take 20 mg by mouth at bedtime.      . sitaGLIPtin-metformin (JANUMET) 50-1000 MG per tablet Take 1 tablet by mouth 2 (two) times daily with a meal.      . warfarin (COUMADIN) 5 MG tablet Take 2.5-5 mg by mouth daily at 6 PM. 2.5 mg on M W F, and 5 mg all other days       No current facility-administered medications for this visit.     Past Medical History  Diagnosis Date  . Persistent atrial fibrillation     recurrent atrial flutter since 2001 s/p DCCVs, multiple failed AADs, h/o tachy-mediated cardiomyopathy  . Hypertension     with hypertensive heart disease  . Hyperlipidemia   . Atrial flutter     radiofrequency ablation in 2001  . Obesity   . Chronic anticoagulation     chronic Coumadin anticoagulation  . Tobacco abuse   . Chronic obstructive pulmonary disease 04/20/2011  . GERD (gastroesophageal reflux disease)   . CAD (coronary artery disease)     Nonobstructive. Cardiac cath in 2001-50% mid RI, normal LM, LAD, RCA  . Shortness of breath     "can come on at any time" (03/14/2013)  . Sleep apnea     "dx'd; couldn't wear the mask" (03/14/2013)  . Diabetes mellitus, type 2   . Arthritis     "  knees and lower back" (03/14/2013)    ROS:   All systems reviewed and negative except as noted in the HPI.   Past Surgical History  Procedure Laterality Date  . Atrial flutter ablation  2002    atrial flutter; subsequently developed atrial fibrillation  . Loop recorder implant  2002  . Cardioversion  05/31/2011    Procedure: CARDIOVERSION;  Surgeon: Cristopher Estimable. Lattie Haw, MD;  Location: [facility];  Service: Cardiovascular;  Laterality: N/A;  . Av node ablation  01/24/2013  . Insert / replace / remove pacemaker  01/24/2013     Medtronic Adapta L dual-chamber pacemaker, serial number [id number]   . Carpal tunnel release Left 1980's  . Loop recorder explant  ~ 2003  . Cardiac catheterization  2002     Family History  Problem Relation Age of Onset  .  Alzheimer's disease Mother   . Osteoporosis Mother      History   Social History  . Marital Status: Legally Separated    Spouse Name: N/A    Number of Children: 1  . Years of Education: N/A   Occupational History  . Unemployed   .     Social History Main Topics  . Smoking status: Former Smoker -- 1.00 packs/day for 42 years    Types: Cigarettes    Quit date: 12/31/2011  . Smokeless tobacco: Never Used  . Alcohol Use: Yes     Comment: 03/14/2013 "stopped drinking back in 2002; never had problem w/it"  . Drug Use: No  . Sexual Activity: Not Currently   Other Topics Concern  . Not on file   Social History Narrative   Unable to afford medications   Resides with girlfriend      Pt lives in [place]   Disabled (arthritis), previously worked at an [employer] and [employer] treatment center.           BP 132/78  Pulse 97  Ht 6' (1.829 m)  Wt 233 lb (105.688 kg)  BMI 31.59 kg/m2  SpO2 100%  Physical Exam:  Well appearing NAD HEENT: Unremarkable Neck:  No JVD, no thyromegally Lymphatics:  No adenopathy Back:  No CVA tenderness Lungs:  Clear HEART:  Regular rate rhythm, no murmurs, no rubs, no clicks Abd:  soft, positive bowel sounds, no organomegally, no rebound, no guarding Ext:  2 plus pulses, no edema, no cyanosis, no clubbing Skin:  No rashes no nodules Neuro:  CN II through XII intact, motor grossly intact  DEVICE  Normal device function.  See PaceArt for details.   Assess/Plan:

## 2013-05-01 ENCOUNTER — Encounter: Payer: Self-pay | Admitting: Internal Medicine

## 2013-05-03 ENCOUNTER — Emergency Department (HOSPITAL_COMMUNITY)
Admission: EM | Admit: 2013-05-03 | Discharge: 2013-05-03 | Disposition: A | Discharge status: Home / Self Care | Payer: Medicare HMO | Attending: Emergency Medicine | Admitting: Emergency Medicine

## 2013-05-03 ENCOUNTER — Encounter (HOSPITAL_COMMUNITY): Payer: Self-pay | Admitting: Emergency Medicine

## 2013-05-03 ENCOUNTER — Emergency Department (HOSPITAL_COMMUNITY): Payer: Medicare HMO

## 2013-05-03 DIAGNOSIS — M129 Arthropathy, unspecified: Secondary | ICD-10-CM | POA: Insufficient documentation

## 2013-05-03 DIAGNOSIS — Z7902 Long term (current) use of antithrombotics/antiplatelets: Secondary | ICD-10-CM | POA: Insufficient documentation

## 2013-05-03 DIAGNOSIS — I10 Essential (primary) hypertension: Secondary | ICD-10-CM | POA: Insufficient documentation

## 2013-05-03 DIAGNOSIS — R42 Dizziness and giddiness: Secondary | ICD-10-CM

## 2013-05-03 DIAGNOSIS — Z9889 Other specified postprocedural states: Secondary | ICD-10-CM | POA: Insufficient documentation

## 2013-05-03 DIAGNOSIS — K219 Gastro-esophageal reflux disease without esophagitis: Secondary | ICD-10-CM | POA: Insufficient documentation

## 2013-05-03 DIAGNOSIS — I4891 Unspecified atrial fibrillation: Secondary | ICD-10-CM | POA: Insufficient documentation

## 2013-05-03 DIAGNOSIS — J441 Chronic obstructive pulmonary disease with (acute) exacerbation: Secondary | ICD-10-CM | POA: Insufficient documentation

## 2013-05-03 DIAGNOSIS — E669 Obesity, unspecified: Secondary | ICD-10-CM | POA: Insufficient documentation

## 2013-05-03 DIAGNOSIS — E119 Type 2 diabetes mellitus without complications: Secondary | ICD-10-CM | POA: Insufficient documentation

## 2013-05-03 DIAGNOSIS — E785 Hyperlipidemia, unspecified: Secondary | ICD-10-CM | POA: Insufficient documentation

## 2013-05-03 DIAGNOSIS — Z87891 Personal history of nicotine dependence: Secondary | ICD-10-CM | POA: Insufficient documentation

## 2013-05-03 DIAGNOSIS — Z791 Long term (current) use of non-steroidal anti-inflammatories (NSAID): Secondary | ICD-10-CM | POA: Insufficient documentation

## 2013-05-03 DIAGNOSIS — R531 Weakness: Secondary | ICD-10-CM

## 2013-05-03 DIAGNOSIS — Z8701 Personal history of pneumonia (recurrent): Secondary | ICD-10-CM | POA: Insufficient documentation

## 2013-05-03 DIAGNOSIS — Z79899 Other long term (current) drug therapy: Secondary | ICD-10-CM | POA: Insufficient documentation

## 2013-05-03 DIAGNOSIS — R5383 Other fatigue: Secondary | ICD-10-CM

## 2013-05-03 DIAGNOSIS — R5381 Other malaise: Secondary | ICD-10-CM | POA: Insufficient documentation

## 2013-05-03 DIAGNOSIS — IMO0002 Reserved for concepts with insufficient information to code with codable children: Secondary | ICD-10-CM

## 2013-05-03 DIAGNOSIS — I2510 Atherosclerotic heart disease of native coronary artery without angina pectoris: Secondary | ICD-10-CM | POA: Insufficient documentation

## 2013-05-03 DIAGNOSIS — M1710 Unilateral primary osteoarthritis, unspecified knee: Secondary | ICD-10-CM | POA: Insufficient documentation

## 2013-05-03 LAB — CBC WITH DIFFERENTIAL/PLATELET
Basophils Absolute: 0.1 10*3/uL (ref 0.0–0.1)
Basophils Relative: 1 % (ref 0–1)
Eosinophils Absolute: 0.2 10*3/uL (ref 0.0–0.7)
Eosinophils Relative: 3 % (ref 0–5)
HCT: 42.6 % (ref 39.0–52.0)
Hemoglobin: 15.3 g/dL (ref 13.0–17.0)
Lymphocytes Relative: 34 % (ref 12–46)
Lymphs Abs: 2.1 10*3/uL (ref 0.7–4.0)
MCH: 32.8 pg (ref 26.0–34.0)
MCHC: 35.9 g/dL (ref 30.0–36.0)
MCV: 91.2 fL (ref 78.0–100.0)
Monocytes Absolute: 0.4 10*3/uL (ref 0.1–1.0)
Monocytes Relative: 7 % (ref 3–12)
Neutro Abs: 3.5 10*3/uL (ref 1.7–7.7)
Neutrophils Relative %: 55 % (ref 43–77)
Platelets: 168 10*3/uL (ref 150–400)
RBC: 4.67 MIL/uL (ref 4.22–5.81)
RDW: 13 % (ref 11.5–15.5)
WBC: 6.3 10*3/uL (ref 4.0–10.5)

## 2013-05-03 LAB — BASIC METABOLIC PANEL
BUN: 20 mg/dL (ref 6–23)
CO2: 27 mEq/L (ref 19–32)
Calcium: 10 mg/dL (ref 8.4–10.5)
Chloride: 100 mEq/L (ref 96–112)
Creatinine, Ser: 1.09 mg/dL (ref 0.50–1.35)
GFR calc Af Amer: 83 mL/min — ABNORMAL LOW (ref >90)
GFR calc non Af Amer: 72 mL/min — ABNORMAL LOW (ref >90)
Glucose, Bld: 114 mg/dL — ABNORMAL HIGH (ref 70–99)
Potassium: 4.1 mEq/L (ref 3.7–5.3)
Sodium: 140 mEq/L (ref 137–147)

## 2013-05-03 LAB — TROPONIN I: Troponin I: <0.3 ng/mL (ref <0.30)

## 2013-05-03 LAB — GLUCOSE, CAPILLARY: Glucose-Capillary: 108 mg/dL — ABNORMAL HIGH (ref 70–99)

## 2013-05-03 NOTE — ED Provider Notes (Signed)
CSN: [id number]     Arrival date & time 05/03/13  1242 History  This chart was scribed for Virgel Manifold, MD by Jenne Campus, ED Scribe. This patient was seen in room [facility] and the patient's care was started at 3:17 PM.   Chief Complaint  Patient presents with  . Dizziness    The history is provided by the patient. No language interpreter was used.    HPI Comments: [name] Cook is a 63 y.o. male with a h/o HTN and A. Fib who presents to the Emergency Department complaining of sudden onset dizziness that he noted after getting up from his computer chair last night. He states that he was able to stand but stumbled a bit and reports that he "just felt weak" but denies any falls. The episode lasted one hour and resolved on its own. He states that the symptoms returned after being up "for a while" this morning. He reports that he noted tachycardia of 112 and hypertension after checking his BP. BP is currently 159/108 in the ED. He has a h/o a "mild" MI for which he was admitted to [facility] in November 2014. He states that since then he just feels "tired all the time". He reports that he currently feels "worn out" like he has no energy but admits that this is the same feeling that has been ongoing since his discharge from [facility]. He denies any CP but states that he has experienced SOB intermittently during times of exertion since then. He denies any palpitations. He denies any medication changes and reports that he has been taking his daily medications as prescribed. He denies any leg swelling, hematochezia or melena. He states that he had a pacemaker placed before the MI to help control the A. Fib and is currently on plavix.  Cardiologist, Crissie Sickles, last visit was on the 14th (3 days ago) for a routine visit  Past Medical History  Diagnosis Date  . Persistent atrial fibrillation     recurrent atrial flutter since 2001 s/p DCCVs, multiple failed AADs, h/o tachy-mediated cardiomyopathy  .  Hypertension     with hypertensive heart disease  . Hyperlipidemia   . Atrial flutter     radiofrequency ablation in 2001  . Obesity   . Chronic anticoagulation     chronic Coumadin anticoagulation  . Tobacco abuse   . Chronic obstructive pulmonary disease 04/20/2011  . GERD (gastroesophageal reflux disease)   . CAD (coronary artery disease)     Nonobstructive. Cardiac cath in 2001-50% mid RI, normal LM, LAD, RCA  . Shortness of breath     "can come on at any time" (03/14/2013)  . Sleep apnea     "dx'd; couldn't wear the mask" (03/14/2013)  . Diabetes mellitus, type 2   . Arthritis     "knees and lower back" (03/14/2013)   Past Surgical History  Procedure Laterality Date  . Atrial flutter ablation  2002    atrial flutter; subsequently developed atrial fibrillation  . Loop recorder implant  2002  . Cardioversion  05/31/2011    Procedure: CARDIOVERSION;  Surgeon: Cristopher Estimable. Lattie Haw, MD;  Location: [facility];  Service: Cardiovascular;  Laterality: N/A;  . Av node ablation  01/24/2013  . Insert / replace / remove pacemaker  01/24/2013     Medtronic Adapta L dual-chamber pacemaker, serial number [id number]   . Carpal tunnel release Left 1980's  . Loop recorder explant  ~ 2003  . Cardiac catheterization  2002   Family History  Problem Relation Age of Onset  . Alzheimer's disease Mother   . Osteoporosis Mother    History  Substance Use Topics  . Smoking status: Former Smoker -- 1.00 packs/day for 42 years    Types: Cigarettes    Quit date: 12/31/2011  . Smokeless tobacco: Never Used  . Alcohol Use: Yes     Comment: 03/14/2013 "stopped drinking back in 2002; never had problem w/it"    Review of Systems  Constitutional: Positive for fatigue.  Respiratory: Positive for shortness of breath.   Cardiovascular: Negative for chest pain and leg swelling.  Gastrointestinal: Negative for blood in stool.  Neurological: Positive for dizziness.  All other systems reviewed and are  negative.    Allergies  Review of patient's allergies indicates no known allergies.  Home Medications   Current Outpatient Rx  Name  Route  Sig  Dispense  Refill  . celecoxib (CELEBREX) 200 MG capsule   Oral   Take 200 mg by mouth daily.         . clopidogrel (PLAVIX) 75 MG tablet   Oral   Take 1 tablet (75 mg total) by mouth daily with breakfast.   30 tablet   6   . fish oil-omega-3 fatty acids 1000 MG capsule   Oral   Take 1 g by mouth daily.           . Fluticasone-Salmeterol (ADVAIR) 250-50 MCG/DOSE AEPB   Inhalation   Inhale 2 puffs into the lungs every 12 (twelve) hours.         . furosemide (LASIX) 20 MG tablet   Oral   Take 1 tablet (20 mg total) by mouth daily.   90 tablet   3   . isosorbide mononitrate (IMDUR) 30 MG 24 hr tablet   Oral   Take 1 tablet (30 mg total) by mouth daily.   30 tablet   6   . lisinopril (PRINIVIL,ZESTRIL) 10 MG tablet   Oral   Take 1 tablet (10 mg total) by mouth daily.   30 tablet   11   . metoprolol succinate (TOPROL-XL) 50 MG 24 hr tablet   Oral   Take 2 tablets (100 mg total) by mouth daily. Take with or immediately following a meal.   30 tablet   11   . pantoprazole (PROTONIX) 40 MG tablet   Oral   Take 40 mg by mouth daily.         . rosuvastatin (CRESTOR) 20 MG tablet   Oral   Take 20 mg by mouth at bedtime.         . sitaGLIPtin-metformin (JANUMET) 50-1000 MG per tablet   Oral   Take 1 tablet by mouth 2 (two) times daily with a meal.         . warfarin (COUMADIN) 5 MG tablet   Oral   Take 2.5-5 mg by mouth daily at 6 PM. 2.5 mg on M W F, and 5 mg all other days         . nitroGLYCERIN (NITROSTAT) 0.4 MG SL tablet   Sublingual   Place 0.4 mg under the tongue every 5 (five) minutes as needed for chest pain.          Triage Vitals: BP 159/108  Pulse 98  Temp(Src) 97.3 F (36.3 C) (Oral)  Resp 20  Ht 6' (1.829 m)  Wt 233 lb (105.688 kg)  BMI 31.59 kg/m2  SpO2 100%  Physical Exam   Nursing note and vitals reviewed.  Constitutional: He is oriented to person, place, and time. He appears well-developed and well-nourished. No distress.  HENT:  Head: Normocephalic and atraumatic.  Eyes: Conjunctivae and EOM are normal.  Neck: Normal range of motion. Neck supple. No tracheal deviation present.  Cardiovascular: Normal rate and regular rhythm.  Exam reveals no gallop and no friction rub.   No murmur heard. Pulmonary/Chest: Effort normal and breath sounds normal. No respiratory distress. He has no wheezes. He has no rales.  Abdominal: Soft. Bowel sounds are normal. He exhibits no distension. There is no tenderness. There is no rebound.  Musculoskeletal: Normal range of motion. He exhibits no edema (no lower extremity edema).  Neurological: He is alert and oriented to person, place, and time.  Skin: Skin is warm and dry. No rash noted.  Psychiatric: He has a normal mood and affect. His behavior is normal.    ED Course  Procedures (including critical care time)  DIAGNOSTIC STUDIES: Oxygen Saturation is 100% on RA, normal by my interpretation.    COORDINATION OF CARE: 3:22 PM-Discussed treatment plan which includes CXR, CBC panel, BMP and troponin with pt at bedside and pt agreed to plan.   Labs Review Labs Reviewed  BASIC METABOLIC PANEL - Abnormal; Notable for the following:    Glucose, Bld 114 (*)    GFR calc non Af Amer 72 (*)    GFR calc Af Amer 83 (*)    All other components within normal limits  GLUCOSE, CAPILLARY - Abnormal; Notable for the following:    Glucose-Capillary 108 (*)    All other components within normal limits  TROPONIN I  CBC WITH DIFFERENTIAL   Imaging Review Dg Chest 2 View  05/03/2013   CLINICAL DATA:  Hypertension.  Dizziness.  EXAM: CHEST  2 VIEW  COMPARISON:  Chest x-ray 03/14/2013.  FINDINGS: Lung volumes are normal. No consolidative airspace disease. No pleural effusions. No pneumothorax. No pulmonary nodule or mass noted. Pulmonary  vasculature and the cardiomediastinal silhouette are within normal limits. Atherosclerosis in the thoracic aorta. Left-sided pacemaker device in place with lead tips projecting over the expected location of the right atrium and right ventricular apex.  IMPRESSION: 1.  No radiographic evidence of acute cardiopulmonary disease. 2. Atherosclerosis.   Electronically Signed   By: Vinnie Langton M.D.   On: 05/03/2013 15:03    EKG Interpretation    Date/Time:  Saturday May 03 2013 13:51:09 EST Ventricular Rate:  88 PR Interval:    QRS Duration: 170 QT Interval:  450 QTC Calculation: 544 R Axis:   -75 Text Interpretation:  Ventricular-paced rhythm Abnormal ECG When compared with ECG of 16-Mar-2013 17:42, Vent. rate has increased BY   8 BPM ED PHYSICIAN INTERPRETATION AVAILABLE IN [facility] Confirmed by TEST, RECORD ([id number]) on 05/05/2013 11:37:31 AM            MDM   1. Dizziness   2. Generalized weakness    63 year old male with vague symptoms of dizziness. Possibly some element of orthostasis? With him noticing symptoms surgically with changes in position. This workup has been fairly unremarkable. He denies any chest pain. This EKG is paced. Troponin is normal. Electrolytes normal. No anemia. Suspicion for emergent process. Return precautions were discussed. Outpatient followup with his PCP or [name] otherwise.    Virgel Manifold, MD 05/06/13 [phone]

## 2013-05-03 NOTE — ED Notes (Signed)
Reports onset of "heart racing" (states rate was 112) high blood pressure, and dizziness last night. Hx of HTN and afib

## 2013-05-03 NOTE — Discharge Instructions (Signed)
Dizziness Dizziness is a common problem. It is a feeling of unsteadiness or lightheadedness. You may feel like you are about to faint. Dizziness can lead to injury if you stumble or fall. A person of any age group can suffer from dizziness, but dizziness is more common in older adults. CAUSES  Dizziness can be caused by many different things, including:  Middle ear problems.  Standing for too long.  Infections.  An allergic reaction.  Aging.  An emotional response to something, such as the sight of blood.  Side effects of medicines.  Fatigue.  Problems with circulation or blood pressure.  Excess use of alcohol, medicines, or illegal drug use.  Breathing too fast (hyperventilation).  An arrhythmia or problems with your heart rhythm.  Low red blood cell count (anemia).  Pregnancy.  Vomiting, diarrhea, fever, or other illnesses that cause dehydration.  Diseases or conditions such as Parkinson's disease, high blood pressure (hypertension), diabetes, and thyroid problems.  Exposure to extreme heat. DIAGNOSIS  To find the cause of your dizziness, your caregiver may do a physical exam, lab tests, radiologic imaging scans, or an electrocardiography test (ECG).  TREATMENT  Treatment of dizziness depends on the cause of your symptoms and can vary greatly. HOME CARE INSTRUCTIONS   Drink enough fluids to keep your urine clear or pale yellow. This is especially important in very hot weather. In the elderly, it is also important in cold weather.  If your dizziness is caused by medicines, take them exactly as directed. When taking blood pressure medicines, it is especially important to get up slowly.  Rise slowly from chairs and steady yourself until you feel okay.  In the morning, first sit up on the side of the bed. When this seems okay, stand slowly while holding onto something until you know your balance is fine.  If you need to stand in one place for a long time, be sure to  move your legs often. Tighten and relax the muscles in your legs while standing.  If dizziness continues to be a problem, have someone stay with you for a day or two. Do this until you feel you are well enough to stay alone. Have the person call your caregiver if he or she notices changes in you that are concerning.  Do not drive or use heavy machinery if you feel dizzy.  Do not drink alcohol. SEEK IMMEDIATE MEDICAL CARE IF:   Your dizziness or lightheadedness gets worse.  You feel nauseous or vomit.  You develop problems with talking, walking, weakness, or using your arms, hands, or legs.  You are not thinking clearly or you have difficulty forming sentences. It may take a friend or family member to determine if your thinking is normal.  You develop chest pain, abdominal pain, shortness of breath, or sweating.  Your vision changes.  You notice any bleeding.  You have side effects from medicine that seems to be getting worse rather than better. MAKE SURE YOU:   Understand these instructions.  Will watch your condition.  Will get help right away if you are not doing well or get worse. Fatigue Fatigue is a feeling of tiredness, lack of energy, lack of motivation, or feeling tired all the time. Having enough rest, good nutrition, and reducing stress will normally reduce fatigue. Consult your caregiver if it persists. The nature of your fatigue will help your caregiver to find out its cause. The treatment is based on the cause.  CAUSES  There are many causes  for fatigue. Most of the time, fatigue can be traced to one or more of your habits or routines. Most causes fit into one or more of three general areas. They are: Lifestyle problems Sleep disturbances. Overwork. Physical exertion. Unhealthy habits. Poor eating habits or eating disorders. Alcohol and/or drug use . Lack of proper nutrition (malnutrition). Psychological problems Stress and/or anxiety  problems. Depression. Grief. Boredom. Medical Problems or Conditions Anemia. Pregnancy. Thyroid gland problems. Recovery from major surgery. Continuous pain. Emphysema or asthma that is not well controlled Allergic conditions. Diabetes. Infections (such as mononucleosis). Obesity. Sleep disorders, such as sleep apnea. Heart failure or other heart-related problems. Cancer. Kidney disease. Liver disease. Effects of certain medicines such as antihistamines, cough and cold remedies, prescription pain medicines, heart and blood pressure medicines, drugs used for treatment of cancer, and some antidepressants. SYMPTOMS  The symptoms of fatigue include:  Lack of energy. Lack of drive (motivation). Drowsiness. Feeling of indifference to the surroundings. DIAGNOSIS  The details of how you feel help guide your caregiver in finding out what is causing the fatigue. You will be asked about your present and past health condition. It is important to review all medicines that you take, including prescription and non-prescription items. A thorough exam will be done. You will be questioned about your feelings, habits, and normal lifestyle. Your caregiver may suggest blood tests, urine tests, or other tests to look for common medical causes of fatigue.  TREATMENT  Fatigue is treated by correcting the underlying cause. For example, if you have continuous pain or depression, treating these causes will improve how you feel. Similarly, adjusting the dose of certain medicines will help in reducing fatigue.  HOME CARE INSTRUCTIONS  Try to get the required amount of good sleep every night. Eat a healthy and nutritious diet, and drink enough water throughout the day. Practice ways of relaxing (including yoga or meditation). Exercise regularly. Make plans to change situations that cause stress. Act on those plans so that stresses decrease over time. Keep your work and personal routine reasonable. Avoid  street drugs and minimize use of alcohol. Start taking a daily multivitamin after consulting your caregiver. SEEK MEDICAL CARE IF:  You have persistent tiredness, which cannot be accounted for. You have fever. You have unintentional weight loss. You have headaches. You have disturbed sleep throughout the night. You are feeling sad. You have constipation. You have dry skin. You have gained weight. You are taking any new or different medicines that you suspect are causing fatigue. You are unable to sleep at night. You develop any unusual swelling of your legs or other parts of your body. SEEK IMMEDIATE MEDICAL CARE IF:  You are feeling confused. Your vision is blurred. You feel faint or pass out. You develop severe headache. You develop severe abdominal, pelvic, or back pain. You develop chest pain, shortness of breath, or an irregular or fast heartbeat. You are unable to pass a normal amount of urine. You develop abnormal bleeding such as bleeding from the rectum or you vomit blood. You have thoughts about harming yourself or committing suicide. You are worried that you might harm someone else. MAKE SURE YOU:  Understand these instructions. Will watch your condition. Will get help right away if you are not doing well or get worse. Document Released: 01/29/2007 Document Revised: 06/26/2011 Document Reviewed: 01/29/2007 [employer] Patient Information 2014 [place].  Document Released: 09/27/2000 Document Revised: 06/26/2011 Document Reviewed: 10/21/2010 [facility] Patient Information 2014 [place], [place].

## 2013-05-12 ENCOUNTER — Encounter: Payer: Medicare HMO | Admitting: Internal Medicine

## 2013-05-13 ENCOUNTER — Encounter (HOSPITAL_COMMUNITY)
Admission: RE | Admit: 2013-05-13 | Discharge: 2013-05-13 | Disposition: A | Discharge status: Home / Self Care | Payer: Medicare HMO | Source: Ambulatory Visit | Attending: Internal Medicine | Admitting: Internal Medicine

## 2013-05-13 VITALS — BP 116/90 | HR 92 | Ht 72.0 in | Wt 232.2 lb

## 2013-05-13 DIAGNOSIS — I214 Non-ST elevation (NSTEMI) myocardial infarction: Secondary | ICD-10-CM

## 2013-05-13 NOTE — Patient Instructions (Signed)
Pt has finished orientation and is scheduled to start CR on 05/26/13 at 11 am. Pt has been instructed to arrive to class 15 minutes early for scheduled class. Pt has been instructed to wear comfortable clothing and shoes with rubber soles. Pt has been told to take their medications 1 hour prior to coming to class.  If the patient is not going to attend class, he/she has been instructed to call.

## 2013-05-13 NOTE — Progress Notes (Addendum)
Patient referred to CR by Dr. Crissie Sickles due to NSTEMI 410.70. During orientation advised patient on arrival and appointment times what to wear, what to do before, during and after exercise. Reviewed attendance and class policy. Talked about inclement weather and class consultation policy. Pt is scheduled to start Cardiac Rehab on 05/26/13 at 11 am. Pt was advised to come to class 5 minutes before class starts. He was also given instructions on meeting with the dietician and attending the Family Structure classes. Pt is eager to get started. Patient was able to do the 6 minute walk test.

## 2013-05-26 ENCOUNTER — Ambulatory Visit: Payer: Self-pay | Admitting: *Deleted

## 2013-05-26 ENCOUNTER — Telehealth: Payer: Self-pay | Admitting: *Deleted

## 2013-05-26 ENCOUNTER — Encounter (HOSPITAL_COMMUNITY)
Admission: RE | Admit: 2013-05-26 | Discharge: 2013-05-26 | Disposition: A | Discharge status: Home / Self Care | Payer: Medicare HMO | Source: Ambulatory Visit | Attending: Internal Medicine | Admitting: Internal Medicine

## 2013-05-26 DIAGNOSIS — I2510 Atherosclerotic heart disease of native coronary artery without angina pectoris: Secondary | ICD-10-CM | POA: Insufficient documentation

## 2013-05-26 DIAGNOSIS — I4819 Other persistent atrial fibrillation: Secondary | ICD-10-CM

## 2013-05-26 DIAGNOSIS — Z5189 Encounter for other specified aftercare: Secondary | ICD-10-CM | POA: Insufficient documentation

## 2013-05-26 DIAGNOSIS — I214 Non-ST elevation (NSTEMI) myocardial infarction: Secondary | ICD-10-CM | POA: Insufficient documentation

## 2013-05-26 DIAGNOSIS — Z7901 Long term (current) use of anticoagulants: Secondary | ICD-10-CM

## 2013-05-26 DIAGNOSIS — I4892 Unspecified atrial flutter: Secondary | ICD-10-CM

## 2013-05-26 NOTE — Telephone Encounter (Signed)
Pt needs his coumadin care transferred to [facility].

## 2013-05-26 NOTE — Telephone Encounter (Signed)
Coumadin records faxed to [facility].

## 2013-05-28 ENCOUNTER — Encounter (HOSPITAL_COMMUNITY)
Admission: RE | Admit: 2013-05-28 | Discharge: 2013-05-28 | Disposition: A | Discharge status: Home / Self Care | Payer: Medicare HMO | Source: Ambulatory Visit | Attending: Internal Medicine | Admitting: Internal Medicine

## 2013-05-30 ENCOUNTER — Encounter (HOSPITAL_COMMUNITY): Payer: Medicare HMO

## 2013-06-02 ENCOUNTER — Encounter (HOSPITAL_COMMUNITY): Payer: Medicare HMO

## 2013-06-04 ENCOUNTER — Encounter (HOSPITAL_COMMUNITY): Payer: Medicare HMO

## 2013-06-06 ENCOUNTER — Encounter (HOSPITAL_COMMUNITY): Payer: Medicare HMO

## 2013-06-09 ENCOUNTER — Encounter (HOSPITAL_COMMUNITY): Payer: Medicare HMO

## 2013-06-11 ENCOUNTER — Encounter (HOSPITAL_COMMUNITY): Payer: Medicare HMO

## 2013-06-12 ENCOUNTER — Encounter: Payer: Medicare HMO | Admitting: Physician Assistant

## 2013-06-13 ENCOUNTER — Encounter (HOSPITAL_COMMUNITY): Payer: Medicare HMO

## 2013-06-16 ENCOUNTER — Encounter (HOSPITAL_COMMUNITY)
Admission: RE | Admit: 2013-06-16 | Discharge: 2013-06-16 | Disposition: A | Discharge status: Home / Self Care | Payer: Medicare HMO | Source: Ambulatory Visit | Attending: Internal Medicine | Admitting: Internal Medicine

## 2013-06-16 DIAGNOSIS — I2510 Atherosclerotic heart disease of native coronary artery without angina pectoris: Secondary | ICD-10-CM | POA: Insufficient documentation

## 2013-06-16 DIAGNOSIS — Z5189 Encounter for other specified aftercare: Secondary | ICD-10-CM | POA: Insufficient documentation

## 2013-06-16 DIAGNOSIS — I214 Non-ST elevation (NSTEMI) myocardial infarction: Secondary | ICD-10-CM | POA: Insufficient documentation

## 2013-06-18 ENCOUNTER — Encounter (HOSPITAL_COMMUNITY)
Admission: RE | Admit: 2013-06-18 | Discharge: 2013-06-18 | Disposition: A | Discharge status: Home / Self Care | Payer: Medicare HMO | Source: Ambulatory Visit | Attending: Internal Medicine | Admitting: Internal Medicine

## 2013-06-19 ENCOUNTER — Encounter: Payer: Self-pay | Admitting: Physician Assistant

## 2013-06-19 ENCOUNTER — Ambulatory Visit (INDEPENDENT_AMBULATORY_CARE_PROVIDER_SITE_OTHER): Payer: Medicare HMO | Admitting: Physician Assistant

## 2013-06-19 VITALS — BP 144/98 | HR 80 | Temp 97.6°F | Resp 18 | Ht 69.75 in | Wt 234.0 lb

## 2013-06-19 DIAGNOSIS — J449 Chronic obstructive pulmonary disease, unspecified: Secondary | ICD-10-CM

## 2013-06-19 DIAGNOSIS — F17200 Nicotine dependence, unspecified, uncomplicated: Secondary | ICD-10-CM

## 2013-06-19 DIAGNOSIS — E119 Type 2 diabetes mellitus without complications: Secondary | ICD-10-CM

## 2013-06-19 DIAGNOSIS — I4819 Other persistent atrial fibrillation: Secondary | ICD-10-CM

## 2013-06-19 DIAGNOSIS — I4892 Unspecified atrial flutter: Secondary | ICD-10-CM

## 2013-06-19 DIAGNOSIS — R7989 Other specified abnormal findings of blood chemistry: Secondary | ICD-10-CM

## 2013-06-19 DIAGNOSIS — I4891 Unspecified atrial fibrillation: Secondary | ICD-10-CM

## 2013-06-19 DIAGNOSIS — E785 Hyperlipidemia, unspecified: Secondary | ICD-10-CM

## 2013-06-19 DIAGNOSIS — N182 Chronic kidney disease, stage 2 (mild): Secondary | ICD-10-CM

## 2013-06-19 DIAGNOSIS — R945 Abnormal results of liver function studies: Secondary | ICD-10-CM

## 2013-06-19 DIAGNOSIS — Z7901 Long term (current) use of anticoagulants: Secondary | ICD-10-CM

## 2013-06-19 DIAGNOSIS — Z720 Tobacco use: Secondary | ICD-10-CM

## 2013-06-19 DIAGNOSIS — I10 Essential (primary) hypertension: Secondary | ICD-10-CM

## 2013-06-19 LAB — LIPID PANEL
Cholesterol: 152 mg/dL (ref 0–200)
HDL: 40 mg/dL (ref >39)
LDL Cholesterol: 79 mg/dL (ref 0–99)
Total CHOL/HDL Ratio: 3.8 Ratio
Triglycerides: 166 mg/dL — ABNORMAL HIGH (ref <150)
VLDL: 33 mg/dL (ref 0–40)

## 2013-06-19 LAB — CBC WITH DIFFERENTIAL/PLATELET
Basophils Absolute: 0.1 10*3/uL (ref 0.0–0.1)
Basophils Relative: 1 % (ref 0–1)
Eosinophils Absolute: 0.3 10*3/uL (ref 0.0–0.7)
Eosinophils Relative: 4 % (ref 0–5)
HCT: 40.9 % (ref 39.0–52.0)
Hemoglobin: 14.5 g/dL (ref 13.0–17.0)
Lymphocytes Relative: 33 % (ref 12–46)
Lymphs Abs: 2.1 10*3/uL (ref 0.7–4.0)
MCH: 31.8 pg (ref 26.0–34.0)
MCHC: 35.5 g/dL (ref 30.0–36.0)
MCV: 89.7 fL (ref 78.0–100.0)
Monocytes Absolute: 0.4 10*3/uL (ref 0.1–1.0)
Monocytes Relative: 7 % (ref 3–12)
Neutro Abs: 3.5 10*3/uL (ref 1.7–7.7)
Neutrophils Relative %: 55 % (ref 43–77)
Platelets: 171 10*3/uL (ref 150–400)
RBC: 4.56 MIL/uL (ref 4.22–5.81)
RDW: 14.2 % (ref 11.5–15.5)
WBC: 6.4 10*3/uL (ref 4.0–10.5)

## 2013-06-19 LAB — COMPLETE METABOLIC PANEL WITH GFR
ALT: 21 U/L (ref 0–53)
AST: 19 U/L (ref 0–37)
Albumin: 4 g/dL (ref 3.5–5.2)
Alkaline Phosphatase: 57 U/L (ref 39–117)
BUN: 16 mg/dL (ref 6–23)
CO2: 25 mEq/L (ref 19–32)
Calcium: 9.2 mg/dL (ref 8.4–10.5)
Chloride: 104 mEq/L (ref 96–112)
Creat: 1.12 mg/dL (ref 0.50–1.35)
GFR, Est African American: 82 mL/min
GFR, Est Non African American: 71 mL/min
Glucose, Bld: 118 mg/dL — ABNORMAL HIGH (ref 70–99)
Potassium: 4.6 mEq/L (ref 3.5–5.3)
Sodium: 138 mEq/L (ref 135–145)
Total Bilirubin: 0.4 mg/dL (ref 0.2–1.2)
Total Protein: 6.5 g/dL (ref 6.0–8.3)

## 2013-06-19 LAB — HEMOGLOBIN A1C
Hgb A1c MFr Bld: 6.3 % — ABNORMAL HIGH (ref <5.7)
Mean Plasma Glucose: 134 mg/dL — ABNORMAL HIGH (ref <117)

## 2013-06-19 LAB — PT WITH INR/FINGERSTICK
INR, fingerstick: 4 — ABNORMAL HIGH (ref 0.80–1.20)
PT, fingerstick: 47.7 seconds — ABNORMAL HIGH (ref 10.4–12.5)

## 2013-06-19 NOTE — Progress Notes (Signed)
Patient ID: [name] Cook MRN: [id number], DOB: Apr 15, 1951, 63 y.o. Date of Encounter: @DATE @  Chief Complaint:  Chief Complaint  Patient presents with  . Annual Exam    coffee with sweet/low, question about isosorbide??, needs referals for [employer]    HPI: 63 y.o. year old white male  presents for office visit today. His last office visit in this office was actually with [name] back in January 2013.  He has a history of atrial fibrillation/A. flutter and is on chronic Coumadin anticoagulation. [facility] wants to start having his Coumadin checks here. He has 5 mg tablets. His current dosing has been: 2.5 mg on all days except for 5 mg on Monday Wednesday and Fridays. [facility]. His last INR was done 04/30/13 and it was 2.9 at that time. Patient states that he has been on this current dose for quite some time and that his levels are usually good.  He also has several other specific things he wants to address today:  Wants to know if we can change his Crestor to a generic. Says in the past he was: To the health department and they only did provide brand-name samples. Says prior to that he had been on a generic.  Wants to know if he can change his Celebrex to a generic  Wants prescription for diabetic shoes.  No other complaints today.    Past Medical History  Diagnosis Date  . Persistent atrial fibrillation     recurrent atrial flutter since 2001 s/p DCCVs, multiple failed AADs, h/o tachy-mediated cardiomyopathy  . Hypertension     with hypertensive heart disease  . Hyperlipidemia   . Atrial flutter     radiofrequency ablation in 2001  . Obesity   . Chronic anticoagulation     chronic Coumadin anticoagulation  . Tobacco abuse   . Chronic obstructive pulmonary disease 04/20/2011  . GERD (gastroesophageal reflux disease)   . CAD (coronary artery disease)     Nonobstructive. Cardiac cath in  2001-50% mid RI, normal LM, LAD, RCA  . Shortness of breath     "can come on at any time" (03/14/2013)  . Sleep apnea     "dx'd; couldn't wear the mask" (03/14/2013)  . Diabetes mellitus, type 2   . Arthritis     "knees and lower back" (03/14/2013)     Home Meds: See attached medication section for current medication list. Any medications entered into computer today will not appear on this note's list. The medications listed below were entered prior to today. Current Outpatient Prescriptions on File Prior to Visit  Medication Sig Dispense Refill  . celecoxib (CELEBREX) 200 MG capsule Take 200 mg by mouth daily.      . clopidogrel (PLAVIX) 75 MG tablet Take 1 tablet (75 mg total) by mouth daily with breakfast.  30 tablet  6  . fish oil-omega-3 fatty acids 1000 MG capsule Take 1 g by mouth daily.        . Fluticasone-Salmeterol (ADVAIR) 250-50 MCG/DOSE AEPB Inhale 2 puffs into the lungs every 12 (twelve) hours.      . furosemide (LASIX) 20 MG tablet Take 1 tablet (20 mg total) by mouth daily.  90 tablet  3  . lisinopril (PRINIVIL,ZESTRIL) 10 MG tablet Take 1 tablet (10 mg total) by mouth daily.  30 tablet  11  . metoprolol succinate (TOPROL-XL) 50 MG 24 hr tablet Take 2 tablets (100 mg  total) by mouth daily. Take with or immediately following a meal.  30 tablet  11  . nitroGLYCERIN (NITROSTAT) 0.4 MG SL tablet Place 0.4 mg under the tongue every 5 (five) minutes as needed for chest pain.      . pantoprazole (PROTONIX) 40 MG tablet Take 40 mg by mouth daily.      . rosuvastatin (CRESTOR) 20 MG tablet Take 20 mg by mouth at bedtime.      . sitaGLIPtin-metformin (JANUMET) 50-1000 MG per tablet Take 1 tablet by mouth 2 (two) times daily with a meal.      . warfarin (COUMADIN) 5 MG tablet Take 2.5-5 mg by mouth daily at 6 PM. 2.5 mg on M W F, and 5 mg all other days      . isosorbide mononitrate (IMDUR) 30 MG 24 hr tablet Take 1 tablet (30 mg total) by mouth daily.  30 tablet  6   No current  facility-administered medications on file prior to visit.    Allergies: No Known Allergies  History   Social History  . Marital Status: Legally Separated    Spouse Name: N/A    Number of Children: 1  . Years of Education: N/A   Occupational History  . Unemployed   .     Social History Main Topics  . Smoking status: Former Smoker -- 1.00 packs/day for 42 years    Types: Cigarettes    Quit date: 12/31/2011  . Smokeless tobacco: Never Used  . Alcohol Use: Yes     Comment: 03/14/2013 "stopped drinking back in 2002; never had problem w/it"  . Drug Use: No  . Sexual Activity: Not Currently   Other Topics Concern  . 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          Family History  Problem Relation Age of Onset  . Alzheimer's disease Mother   . Osteoporosis Mother      Review of Systems:  See HPI for pertinent ROS. All other ROS negative.    Physical Exam: Blood pressure 144/98, pulse 80, temperature 97.6 F (36.4 C), temperature source Oral, resp. rate 18, height 5' 9.75" (1.772 m), weight 234 lb (106.142 kg)., Body mass index is 33.8 kg/(m^2). General: WNWD WM Appears in no acute distress.  Neck: Supple. No thyromegaly. No lymphadenopathy. Lungs: Clear bilaterally to auscultation without wheezes, rales, or rhonchi. Breathing is unlabored. Heart: Irreg. No murmur. Abdomen: Soft, non-tender, non-distended with normoactive bowel sounds. No hepatomegaly. No rebound/guarding. No obvious abdominal masses. Musculoskeletal:  Strength and tone normal for age. Extremities/Skin: Warm and dry.  No edema.  Neuro: Alert and oriented X 3. Moves all extremities spontaneously. Gait is normal. CNII-XII grossly in tact. Psych:  Responds to questions appropriately with a normal affect.  diabetic foot exam:  Inspection is normal. Sensation is intact. 2+ bilateral posterior tibial pulses. Trace bilateral dorsalis pedis pulses.    Results for orders placed in visit on 06/19/13  PT WITH INR/FINGERSTICK      Result Value Ref Range   PT, fingerstick 47.7 (*) 10.4 - 12.5 seconds   INR, fingerstick 4.0 (*) 0.80 - 1.20     ASSESSMENT AND PLAN:  63 y.o. year old male with  1. Long term (current) use of anticoagulants Goal INR :  2-3 Usually  on Fridays he takes at 5 mg tablet. Told him that for this Friday he takes only a 2.5 mg. Otherwise continue current dosing. Recheck INR in one week. Do not want to make any drastic changes as he says that he's been on this dose for quite some time and usually not therapeutic. - PT with INR/Fingerstick - CBC with Differential  2. Chronic anticoagulation  3. Atrial flutter  4. Atrial fibrillation  5. Diabetes mellitus, type 2 - COMPLETE METABOLIC PANEL WITH GFR J9E  6. Hyperlipidemia - COMPLETE METABOLIC PANEL WITH GFR - Lipid panel  7. Abnormal LFTs - COMPLETE METABOLIC PANEL WITH GFR  8. Hypertension - COMPLETE METABOLIC PANEL WITH GFR  9. Chronic kidney disease, stage II (mild) - COMPLETE METABOLIC PANEL WITH GFR  10. Chronic obstructive pulmonary disease  11. Tobacco abuse  Patient appointment slot that he was here for physical exam. I explained to him that we cannot get this done today given the things that needed to be addressed at this time. He is understanding and agreeable with this approach. He has diabetes and has had no A1c and monitoring of his diabetes in over one year. As well no recent lipid panel. I'm going to obtain this lab right now. He is going to schedule followup office visit with me in one week so that we can further address these issues and down to get to the point that we can actually cover a complete physical exam.  I will need to check labs before I can make a decision about changing his Crestor. Discussed the given he  is taking both Plavix and Coumadin any other medication besides Celebrex could be dangerous and possible GI bleed. Today staff went to work on getting him prescription diabetic shoes. He is to schedule followup office visit with me in one week so that we can follow up today's labs.   Signed, [address] [place], [place], [facility] 06/19/2013 11:35 AM

## 2013-06-20 ENCOUNTER — Encounter (HOSPITAL_COMMUNITY): Payer: Medicare HMO

## 2013-06-23 ENCOUNTER — Encounter (HOSPITAL_COMMUNITY)
Admission: RE | Admit: 2013-06-23 | Discharge: 2013-06-23 | Disposition: A | Discharge status: Home / Self Care | Payer: Medicare HMO | Source: Ambulatory Visit | Attending: Internal Medicine | Admitting: Internal Medicine

## 2013-06-24 NOTE — Progress Notes (Signed)
Cardiac Rehabilitation Program Outcomes Report   Orientation:  05/13/2013 1st Week: 06/16/13 Graduate Date:  tbd Discharge Date:  tbd # of sessions completed: 3 DX: CAD/ NSTEMI  Cardiologist: Janus Molder MD:  No Set PCP Class Time:  11:00  A.  Exercise Program:  Tolerates exercise @ 3.87 METS for 15 minutes and Walk Test Results:  Pre: Pre Walk Test: Rest HR 92, BP 116/90, O2 98, RPE 7 and RPD 7, 6 min HR 112, BP 152/100,O2 92%, RPE 13 and RPE 13, Post HR 90, BP 134/100, O2 100% RPE 10 and RPD 10. Walked 1350 feet at 2.55 mph at 2.95 METS.   B.  Mental Health:  Good mental attitude  C.  Education/Instruction/Skills  Accurately checks own pulse.  Rest:  76  Exercise:  115, Knows THR for exercise and Uses Perceived Exertion Scale and/or Dyspnea Scale  Uses Perceived Exertion Scale and/or Dyspnea Scale  D.  Nutrition/Weight Control/Body Composition:  Adherence to prescribed nutrition program: good    E.  Blood Lipids    Lab Results  Component Value Date   CHOL 152 06/19/2013   HDL 40 06/19/2013   LDLCALC 79 06/19/2013   TRIG 166* 06/19/2013   CHOLHDL 3.8 06/19/2013    F.  Lifestyle Changes:  Making positive lifestyle changes and Not smoking:  Quit 2013  G.  Symptoms noted with exercise:  Asymptomatic  Report Completed By:  Oletta Lamas. Satara Virella RN   Comments:  This is patients first week report. He has done well. He achieved a peak METS of 3.87. His resting HR was 76 and resting BP 136/90, his peak HR is 115 and peak BP 140/90. A report will follow upon his 18th visit, his halfway point.

## 2013-06-25 ENCOUNTER — Encounter (HOSPITAL_COMMUNITY)
Admission: RE | Admit: 2013-06-25 | Discharge: 2013-06-25 | Disposition: A | Discharge status: Home / Self Care | Payer: Medicare HMO | Source: Ambulatory Visit | Attending: Internal Medicine | Admitting: Internal Medicine

## 2013-06-25 ENCOUNTER — Other Ambulatory Visit: Payer: Self-pay | Admitting: Physician Assistant

## 2013-06-25 DIAGNOSIS — Z1211 Encounter for screening for malignant neoplasm of colon: Secondary | ICD-10-CM

## 2013-06-26 ENCOUNTER — Ambulatory Visit (INDEPENDENT_AMBULATORY_CARE_PROVIDER_SITE_OTHER): Payer: Medicare HMO | Admitting: Physician Assistant

## 2013-06-26 ENCOUNTER — Encounter: Payer: Self-pay | Admitting: Physician Assistant

## 2013-06-26 VITALS — BP 132/90 | HR 72 | Temp 97.6°F | Resp 18 | Wt 227.0 lb

## 2013-06-26 DIAGNOSIS — E119 Type 2 diabetes mellitus without complications: Secondary | ICD-10-CM

## 2013-06-26 DIAGNOSIS — Z950 Presence of cardiac pacemaker: Secondary | ICD-10-CM

## 2013-06-26 DIAGNOSIS — J449 Chronic obstructive pulmonary disease, unspecified: Secondary | ICD-10-CM

## 2013-06-26 DIAGNOSIS — E785 Hyperlipidemia, unspecified: Secondary | ICD-10-CM

## 2013-06-26 DIAGNOSIS — Z1212 Encounter for screening for malignant neoplasm of rectum: Secondary | ICD-10-CM

## 2013-06-26 DIAGNOSIS — I4819 Other persistent atrial fibrillation: Secondary | ICD-10-CM

## 2013-06-26 DIAGNOSIS — Z1211 Encounter for screening for malignant neoplasm of colon: Secondary | ICD-10-CM

## 2013-06-26 DIAGNOSIS — I4892 Unspecified atrial flutter: Secondary | ICD-10-CM

## 2013-06-26 DIAGNOSIS — I4891 Unspecified atrial fibrillation: Secondary | ICD-10-CM

## 2013-06-26 DIAGNOSIS — Z7901 Long term (current) use of anticoagulants: Secondary | ICD-10-CM

## 2013-06-26 DIAGNOSIS — I10 Essential (primary) hypertension: Secondary | ICD-10-CM

## 2013-06-26 DIAGNOSIS — K219 Gastro-esophageal reflux disease without esophagitis: Secondary | ICD-10-CM

## 2013-06-26 LAB — PT WITH INR/FINGERSTICK
INR, fingerstick: 2.4 — ABNORMAL HIGH (ref 0.80–1.20)
PT, fingerstick: 28.7 seconds — ABNORMAL HIGH (ref 10.4–12.5)

## 2013-06-26 MED ORDER — CELECOXIB 200 MG PO CAPS
200.0000 mg | ORAL_CAPSULE | Freq: Every day | ORAL | Status: DC
Start: 2013-06-26 — End: 2013-12-10

## 2013-06-26 MED ORDER — ROSUVASTATIN CALCIUM 20 MG PO TABS: 20.0000 mg | ORAL_TABLET | Freq: Every day | ORAL | Status: DC

## 2013-06-26 MED ORDER — PANTOPRAZOLE SODIUM 40 MG PO TBEC: 40.0000 mg | DELAYED_RELEASE_TABLET | Freq: Every day | ORAL | Status: DC

## 2013-06-27 ENCOUNTER — Encounter: Payer: Self-pay | Admitting: Physician Assistant

## 2013-06-27 ENCOUNTER — Encounter (HOSPITAL_COMMUNITY): Payer: Medicare HMO

## 2013-06-27 LAB — MICROALBUMIN, URINE: Microalb, Ur: 0.5 mg/dL (ref 0.00–1.89)

## 2013-06-27 NOTE — Progress Notes (Signed)
Patient ID: [name] Cook MRN: [id number], DOB: 05-19-1950, 63 y.o. Date of Encounter: @DATE @  Chief Complaint:  Chief Complaint  Patient presents with  . 1 week follow up    HPI: 63 y.o. year old white male  presents for followup office visit.  He had his first office visit with me on 06/19/2013. He had been seen in our office in the past by [name]. First visit with him was 01/05/2011. He only saw him one other visit and that  was 04/19/2011.  Since then, patient has been seeing  Cardiology.  Had also [facility] the [facility].  He had been having his PT INR checked at [facility].  However he wanted to start having it checked at our office. He presented here on 06/19/13 for PT/INR check and also to re- establish primary care here.  He is on Coumadin for history of atrial fibrillation/atrial flutter. Goal INR is 2-3. Prior to his visit with me 06/19/2013 he was taking 2.5 mg on all days except for 5 mg on Mondays /  Wednesdays /  Fridays. At his visit on 06/19/13 his INR was 4.0. At that visit he reported that he had been on that dose for a long time. INR has usually been therapeutic on that dose. Therefore I did not want to make a huge change in his medication dosing. Told him that for that Friday take 2.5 mg instead of 5 mg.  After that return to the above prior dosing. Today he states that he did take 2.5 mg on that one Friday but otherwise since then has been back to taking 2.5 mg all days except for 5 mg on Mondays /  Wednesdays / Fridays.  [facility] did fax me his Coumadin Clinic records.  I reviewed that in the past between 12/2011 - 09/2012 he was taking 2.5 mg instead of 5 mg on Wednesdays.  He was taking 5 mg on Mondays and Fridays only. Was taking 2.5 mg all other days. However, since 10/24/12 he has continued to be taking           2.5 mg every day except for 5 mg Mondays  /  Wednesdays /  Fridays  --    at every single check.  Also at last visit I reviewed labs in the computer and noticed that he has had no recent A1c or lipid panel but is on medications for diabetes and hyperlipidemia. Therefore we checked appropriate labs and scheduled him for followup visit today.    Past Medical History  Diagnosis Date  . Persistent atrial fibrillation     recurrent atrial flutter since 2001 s/p DCCVs, multiple failed AADs, h/o tachy-mediated cardiomyopathy  . Hypertension     with hypertensive heart disease  . Hyperlipidemia   . Atrial flutter     radiofrequency ablation in 2001  . Obesity   . Chronic anticoagulation     chronic Coumadin anticoagulation  . Tobacco abuse   . Chronic obstructive pulmonary disease 04/20/2011  . GERD (gastroesophageal reflux disease)   . CAD (coronary artery disease)     Nonobstructive. Cardiac cath in 2001-50% mid RI, normal LM, LAD, RCA  . Shortness of breath     "can come on at any time" (03/14/2013)  . Sleep apnea     "dx'd; couldn't wear the mask" (03/14/2013)  . Diabetes mellitus, type 2   . Arthritis     "knees and  lower back" (03/14/2013)     Home Meds: See attached medication section for current medication list. Any medications entered into computer today will not appear on this note's list. The medications listed below were entered prior to today. Current Outpatient Prescriptions on File Prior to Visit  Medication Sig Dispense Refill  . clopidogrel (PLAVIX) 75 MG tablet Take 1 tablet (75 mg total) by mouth daily with breakfast.  30 tablet  6  . fish oil-omega-3 fatty acids 1000 MG capsule Take 1 g by mouth daily.        . Fluticasone-Salmeterol (ADVAIR) 250-50 MCG/DOSE AEPB Inhale 2 puffs into the lungs every 12 (twelve) hours.      . furosemide (LASIX) 20 MG tablet Take 1 tablet (20 mg total) by mouth daily.  90 tablet  3  . isosorbide mononitrate (IMDUR) 30 MG 24 hr tablet Take 1 tablet (30 mg total) by mouth daily.  30 tablet  6  . lisinopril  (PRINIVIL,ZESTRIL) 10 MG tablet Take 1 tablet (10 mg total) by mouth daily.  30 tablet  11  . metoprolol succinate (TOPROL-XL) 50 MG 24 hr tablet Take 2 tablets (100 mg total) by mouth daily. Take with or immediately following a meal.  30 tablet  11  . nitroGLYCERIN (NITROSTAT) 0.4 MG SL tablet Place 0.4 mg under the tongue every 5 (five) minutes as needed for chest pain.      . sitaGLIPtin-metformin (JANUMET) 50-1000 MG per tablet Take 1 tablet by mouth 2 (two) times daily with a meal.      . warfarin (COUMADIN) 5 MG tablet Take 2.5-5 mg by mouth daily at 6 PM. 2.5 mg on M W F, and 5 mg all other days       No current facility-administered medications on file prior to visit.    Allergies: No Known Allergies  History   Social History  . Marital Status: Legally Separated    Spouse Name: N/A    Number of Children: 1  . Years of Education: N/A   Occupational History  . Unemployed   .     Social History Main Topics  . Smoking status: Former Smoker -- 1.00 packs/day for 42 years    Types: Cigarettes    Quit date: 12/31/2011  . Smokeless tobacco: Never Used  . Alcohol Use: Yes     Comment: 03/14/2013 "stopped drinking back in 2002; never had problem w/it"  . Drug Use: No  . Sexual Activity: Not Currently   Other Topics Concern  . 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          Family History  Problem Relation Age of Onset  . Alzheimer's disease Mother   . Osteoporosis Mother      Review of Systems:  See HPI for pertinent ROS. All other ROS negative.    Physical Exam: Blood pressure 132/90, pulse 72, temperature 97.6 F (36.4 C), temperature source Oral, resp. rate 18, weight 227 lb (102.967 kg)., Body mass index is 32.79 kg/(m^2). General: WNWD WM. Appears in no acute distress. Neck: Supple. No  thyromegaly. No lymphadenopathy. No carotid bruits. Lungs: Clear bilaterally to auscultation without wheezes, rales, or rhonchi. Breathing is unlabored. Heart: Irreg rhythm Abdomen: Soft, non-tender, non-distended with normoactive bowel sounds. No hepatomegaly. No rebound/guarding. No obvious abdominal  masses. Musculoskeletal:  Strength and tone normal for age. Extremities/Skin: Warm and dry. No clubbing or cyanosis. No edema. No rashes or suspicious lesions. Neuro: Alert and oriented X 3. Moves all extremities spontaneously. Gait is normal. CNII-XII grossly in tact. Psych:  Responds to questions appropriately with a normal affect. Diabetic foot exam: Inspection is normal.  Sensation is intact and normal.  He has 2+ posterior tibial pulses bilaterally. Trace dorsalis pedis pulses bilaterally.    Results for orders placed in visit on 06/26/13  PT WITH INR/FINGERSTICK      Result Value Ref Range   PT, fingerstick 28.7 (*) 10.4 - 12.5 seconds   INR, fingerstick 2.4 (*) 0.80 - 1.20  MICROALBUMIN, URINE      Result Value Ref Range   Microalb, Ur 0.50  0.00 - 1.89 mg/dL     ASSESSMENT AND PLAN:  63 y.o. year old male with  1. Long term (current) use of anticoagulants - PT with INR/Fingerstick Return to prior dosing of 2.5 mg every day except for 5 mg on Mondays Wednesdays Fridays Since his INR had gone up to 4.0 at last check Will have him recheck INR in 2 weeks.  2. Chronic anticoagulation  3. Atrial fibrillation  4. Atrial flutter  5. Pacemaker  6. Diabetes mellitus, type 2 - Microalbumin, urine  On 06/19/13 A1c was 6.3 --continue current meds for diabetes.  He states that he checks his blood sugar about 3 times per week. Checks at different times of day. Never gets < 80. He is on ACE inhibitor. He is on statin therapy.  Kidney function is normal. Foot exam is stable. Patient states that he had an eye exam about one year ago. Is due to schedule followup and patient plans to  schedule this. He was told that exam showed "no diabetic damage "  7. Hypertension At goal. On ACE inhibitor. BMPT normal.  8. Hyperlipidemia At lab 06/19/13 checked FLP and LFTs. LFTs normal. Lipid Panel is excellent. Cont Crestor 20 mg. - rosuvastatin (CRESTOR) 20 MG tablet; Take 1 tablet (20 mg total) by mouth at bedtime.  Dispense: 30 tablet; Refill: 5  9. Chronic obstructive pulmonary disease Quit smoking 2013.  10. GASTROESOPHAGEAL REFLUX DISEASE Protonix can be used with Plavix to continue this one. - pantoprazole (PROTONIX) 40 MG tablet; Take 1 tablet (40 mg total) by mouth daily.  Dispense: 30 tablet; Refill: 5  11. We'll check a screening PSA and he is due for next lab draw.  12. Screening for colorectal cancer He reports that he has never had a colonoscopy. Says in the past he was going to -but that's when he then lost his insurance. He is agreeable to proceed with this and is agreeable for me to do her referral for followup with GI. - Ambulatory referral to Gastroenterology  13. Immunizations: He received Pneumovax 23 here in the office on 01/05/2011 He received tetanus here at our office 01/05/2011 He states that he had the influenza vaccine this year at the pharmacy  Plan for him to return here for PT-INR check in 2 weeks. Return for routine followup office visit in 3 months.  F/U  sooner if needed.  Signed, [address] [place], [place], [facility] 06/27/2013 11:01 AM

## 2013-06-30 ENCOUNTER — Encounter (HOSPITAL_COMMUNITY): Payer: Medicare HMO

## 2013-07-02 ENCOUNTER — Encounter (HOSPITAL_COMMUNITY): Payer: Medicare HMO

## 2013-07-04 ENCOUNTER — Encounter (HOSPITAL_COMMUNITY): Payer: Medicare HMO

## 2013-07-07 ENCOUNTER — Encounter (HOSPITAL_COMMUNITY): Payer: Medicare HMO

## 2013-07-09 ENCOUNTER — Encounter (HOSPITAL_COMMUNITY): Payer: Medicare HMO

## 2013-07-11 ENCOUNTER — Encounter (HOSPITAL_COMMUNITY): Payer: Medicare HMO

## 2013-07-14 ENCOUNTER — Encounter (HOSPITAL_COMMUNITY): Payer: Medicare HMO

## 2013-07-14 ENCOUNTER — Ambulatory Visit: Payer: Medicare HMO | Admitting: Physician Assistant

## 2013-07-16 ENCOUNTER — Encounter (HOSPITAL_COMMUNITY): Payer: Medicare HMO

## 2013-07-17 ENCOUNTER — Encounter: Payer: Self-pay | Admitting: Physician Assistant

## 2013-07-17 ENCOUNTER — Ambulatory Visit (INDEPENDENT_AMBULATORY_CARE_PROVIDER_SITE_OTHER): Payer: Medicare HMO | Admitting: Physician Assistant

## 2013-07-17 VITALS — BP 132/88 | HR 84 | Temp 97.4°F | Resp 18 | Wt 234.0 lb

## 2013-07-17 DIAGNOSIS — I4821 Permanent atrial fibrillation: Secondary | ICD-10-CM | POA: Insufficient documentation

## 2013-07-17 DIAGNOSIS — I4891 Unspecified atrial fibrillation: Secondary | ICD-10-CM

## 2013-07-17 DIAGNOSIS — I4819 Other persistent atrial fibrillation: Secondary | ICD-10-CM

## 2013-07-17 LAB — PT WITH INR/FINGERSTICK
INR, fingerstick: 2.9 — ABNORMAL HIGH (ref 0.80–1.20)
PT, fingerstick: 34.4 seconds — ABNORMAL HIGH (ref 10.4–12.5)

## 2013-07-17 NOTE — Progress Notes (Signed)
Patient ID: [name] Cook MRN: [id number], DOB: 1951-03-11, 63 y.o. Date of Encounter: 07/17/2013, 9:13 AM    Chief Complaint:  Chief Complaint  Patient presents with  . here for PT/INR     HPI: 63 y.o. year old white male for PT/INR check. He has history of atrial fibrillation and goal INR is 2.0 - 3.0.   He is currently taking the following dosing: 2.5 mg on all days  Except  for 5 mg on Mondays / Wednesdays / Fridays  When he first started coming here, which was just recent, I reviewed his records from [facility]. (They had faxed me a printout copy showed all of his INRs and all of his dosings) For the past several months he had mostly been on the above dosing,  which is his current dosing.  Today he reports that he is taking the medication as directed and has had no skips doses. He reports that his diet is stable and with a stable intake of vitamin K containing foods. He reports that he has had no bleeding including bleeding from the gums.  He does state that cardiology is the one that started prescribing both Plavix and Coumadin and that they are very well aware that he is taking both of these together.     Home Meds: See attached medication section for any medications that were entered at today's visit. The computer does not put those onto this list.The following list is a list of meds entered prior to today's visit.   Current Outpatient Prescriptions on File Prior to Visit  Medication Sig Dispense Refill  . celecoxib (CELEBREX) 200 MG capsule Take 1 capsule (200 mg total) by mouth daily.  30 capsule  5  . clopidogrel (PLAVIX) 75 MG tablet Take 1 tablet (75 mg total) by mouth daily with breakfast.  30 tablet  6  . fish oil-omega-3 fatty acids 1000 MG capsule Take 1 g by mouth daily.        . Fluticasone-Salmeterol (ADVAIR) 250-50 MCG/DOSE AEPB Inhale 2 puffs into the lungs every 12 (twelve) hours.      . furosemide (LASIX) 20 MG tablet Take 1 tablet  (20 mg total) by mouth daily.  90 tablet  3  . isosorbide mononitrate (IMDUR) 30 MG 24 hr tablet Take 1 tablet (30 mg total) by mouth daily.  30 tablet  6  . lisinopril (PRINIVIL,ZESTRIL) 10 MG tablet Take 1 tablet (10 mg total) by mouth daily.  30 tablet  11  . metoprolol succinate (TOPROL-XL) 50 MG 24 hr tablet Take 2 tablets (100 mg total) by mouth daily. Take with or immediately following a meal.  30 tablet  11  . nitroGLYCERIN (NITROSTAT) 0.4 MG SL tablet Place 0.4 mg under the tongue every 5 (five) minutes as needed for chest pain.      . pantoprazole (PROTONIX) 40 MG tablet Take 1 tablet (40 mg total) by mouth daily.  30 tablet  5  . rosuvastatin (CRESTOR) 20 MG tablet Take 1 tablet (20 mg total) by mouth at bedtime.  30 tablet  5  . sitaGLIPtin-metformin (JANUMET) 50-1000 MG per tablet Take 1 tablet by mouth 2 (two) times daily with a meal.      . warfarin (COUMADIN) 5 MG tablet Take 2.5-5 mg by mouth daily at 6 PM. 5 mg on M W F, and 2.5 mg all other days       No current facility-administered medications on file prior to visit.  Allergies: No Known Allergies    Review of Systems: See HPI for pertinent ROS. All other ROS negative.    Physical Exam: Blood pressure 132/88, pulse 84, temperature 97.4 F (36.3 C), temperature source Oral, resp. rate 18, weight 234 lb (106.142 kg)., Body mass index is 33.8 kg/(m^2). General: WNWD/ Mildly overweight WM.  Appears in no acute distress. Neck: Supple. No thyromegaly. No lymphadenopathy. Lungs: Clear bilaterally to auscultation without wheezes, rales, or rhonchi. Breathing is unlabored. Heart: Irregular Msk:  Strength and tone normal for age. Extremities/Skin: Warm and dry.. No edema.  Neuro: Alert and oriented X 3. Moves all extremities spontaneously. Gait is normal. CNII-XII grossly in tact. Psych:  Responds to questions appropriately with a normal affect.   INR today is 2.9   ASSESSMENT AND PLAN:  63 y.o. year old male with  1.  Atrial fibrillation - PT with INR/Fingerstick  2. Persistent atrial fibrillation - PT with INR/Fingerstick  INR is therapeutic at 2.9. Continue current dosing of 2.5 mg on all days except for 5 mg on Mondays /  Wednesdays /  Fridays. Return to clinic to repeat INR check 4 weeks. Sooner if needed.   [address] [place], [place], [facility] 07/17/2013 9:13 AM

## 2013-07-18 ENCOUNTER — Encounter (HOSPITAL_COMMUNITY): Payer: Medicare HMO

## 2013-07-21 ENCOUNTER — Encounter (HOSPITAL_COMMUNITY): Payer: Medicare HMO

## 2013-07-22 NOTE — Progress Notes (Signed)
Cardiac Rehabilitation Program Outcomes Report   Orientation:  05/13/2013 Graduate Date:  NA Discharge Date:  06/25/2013 # of sessions completed: 6 DX: CAD and NSTEMI  Cardiologist: [name] Family MD:  No PCP per patient Class Time:  11:00  A.  Exercise Program:  Tolerates exercise @ 3.87 METS for 15 minutes and Walk Test Results:  Post: Post test not performed patient did not finish program Only attended 6 sessions. Discharged  B.  Mental Health:  Good mental attitude  C.  Education/Instruction/Skills  Accurately checks own pulse.  Rest:  95  Exercise:   116, Knows THR for exercise, Uses Perceived Exertion Scale and/or Dyspnea Scale and Attended 2 education classes  Uses Perceived Exertion Scale and/or Dyspnea Scale  D.  Nutrition/Weight Control/Body Composition:  Adherence to prescribed nutrition program: good    E.  Blood Lipids    Lab Results  Component Value Date   CHOL 152 06/19/2013   HDL 40 06/19/2013   LDLCALC 79 06/19/2013   TRIG 166* 06/19/2013   CHOLHDL 3.8 06/19/2013    F.  Lifestyle Changes:  Making positive lifestyle changes and Not smoking:  Quit 2013  G.  Symptoms noted with exercise:  Asymptomatic  Report Completed By:  Oletta Lamas. Merland Holness RN   Comments:  This is patients discharge report. He only attended 6 sessions, He achieved a peak METS of 3.87. His resting HR was 95 and resting BP was 110/80 and  Peak HR was 116 and peak BP was 134/70. He did well while in rehab.

## 2013-07-22 NOTE — Addendum Note (Signed)
Encounter addended by: Norlene Duel, RN on: 07/22/2013  9:08 AM<BR>     Documentation filed: Clinical Notes

## 2013-07-22 NOTE — Addendum Note (Signed)
Encounter addended by: Norlene Duel, RN on: 07/22/2013  9:06 AM<BR>     Documentation filed: Notes Section

## 2013-07-23 ENCOUNTER — Encounter (HOSPITAL_COMMUNITY): Payer: Medicare HMO

## 2013-07-25 ENCOUNTER — Encounter (HOSPITAL_COMMUNITY): Payer: Medicare HMO

## 2013-07-28 ENCOUNTER — Encounter (HOSPITAL_COMMUNITY): Payer: Medicare HMO

## 2013-07-30 ENCOUNTER — Encounter (HOSPITAL_COMMUNITY): Payer: Medicare HMO

## 2013-08-01 ENCOUNTER — Encounter: Payer: Medicare HMO | Admitting: *Deleted

## 2013-08-01 ENCOUNTER — Encounter (HOSPITAL_COMMUNITY): Payer: Medicare HMO

## 2013-08-04 ENCOUNTER — Encounter (HOSPITAL_COMMUNITY): Payer: Medicare HMO

## 2013-08-05 ENCOUNTER — Telehealth: Payer: Self-pay

## 2013-08-05 NOTE — Telephone Encounter (Signed)
Pt returned call. He said his well just went out and he has such an expense for that.  He also has [employer]. ( He was referred through the workqueue).   He will call me back when he is ready to schedule.

## 2013-08-05 NOTE — Telephone Encounter (Signed)
Pt was referred for screening colonoscopy by Karis Juba at [facility].  He is on coumadin and will need OV prior to colonoscopy.  I called and LMOM for a return call.

## 2013-08-06 ENCOUNTER — Encounter (HOSPITAL_COMMUNITY): Payer: Medicare HMO

## 2013-08-08 ENCOUNTER — Encounter (HOSPITAL_COMMUNITY): Payer: Medicare HMO

## 2013-08-11 ENCOUNTER — Encounter (HOSPITAL_COMMUNITY): Payer: Medicare HMO

## 2013-08-13 ENCOUNTER — Encounter: Payer: Self-pay | Admitting: *Deleted

## 2013-08-13 ENCOUNTER — Encounter (HOSPITAL_COMMUNITY): Payer: Medicare HMO

## 2013-08-14 ENCOUNTER — Encounter: Payer: Self-pay | Admitting: Physician Assistant

## 2013-08-14 ENCOUNTER — Ambulatory Visit (INDEPENDENT_AMBULATORY_CARE_PROVIDER_SITE_OTHER): Payer: Medicare HMO | Admitting: Physician Assistant

## 2013-08-14 VITALS — BP 156/104 | HR 80 | Temp 97.3°F | Resp 18 | Wt 229.0 lb

## 2013-08-14 DIAGNOSIS — Z7901 Long term (current) use of anticoagulants: Secondary | ICD-10-CM

## 2013-08-14 DIAGNOSIS — M778 Other enthesopathies, not elsewhere classified: Secondary | ICD-10-CM

## 2013-08-14 DIAGNOSIS — I4819 Other persistent atrial fibrillation: Secondary | ICD-10-CM

## 2013-08-14 DIAGNOSIS — M65849 Other synovitis and tenosynovitis, unspecified hand: Secondary | ICD-10-CM

## 2013-08-14 DIAGNOSIS — M65839 Other synovitis and tenosynovitis, unspecified forearm: Secondary | ICD-10-CM

## 2013-08-14 DIAGNOSIS — I10 Essential (primary) hypertension: Secondary | ICD-10-CM

## 2013-08-14 DIAGNOSIS — I4891 Unspecified atrial fibrillation: Secondary | ICD-10-CM

## 2013-08-14 LAB — PT WITH INR/FINGERSTICK
INR, fingerstick: 3.2 — ABNORMAL HIGH (ref 0.80–1.20)
PT, fingerstick: 38.8 seconds — ABNORMAL HIGH (ref 10.4–12.5)

## 2013-08-14 MED ORDER — LISINOPRIL 20 MG PO TABS: 20.0000 mg | ORAL_TABLET | Freq: Every day | ORAL | Status: DC

## 2013-08-14 NOTE — Progress Notes (Signed)
Patient ID: [name] Cook MRN: [id number], DOB: 1950-11-21, 63 y.o. Date of Encounter: 08/14/2013, 11:15 AM    Chief Complaint:  Chief Complaint  Patient presents with  . 4 week follow up    PT/INR  . swelling in rt wrist    unknown cause     HPI: 63 y.o. year old white male for PT/INR check. He has history of atrial fibrillation and goal INR is 2.0 - 3.0.   He is currently taking the following dosing: 2.5 mg on all days  Except  for 5 mg on Mondays / Wednesdays / Fridays  When he first started coming here, which was just recent, I reviewed his records from [facility]. (They had faxed me a printout copy showed all of his INRs and all of his dosings) For the past several months he had mostly been on the above dosing,  which is his current dosing.  Today he reports that he is taking the medication as directed and has had no skips doses. He reports that his diet is stable and with a stable intake of vitamin K containing foods. He reports that he has had no bleeding including bleeding from the gums.  He does state that cardiology is the one that started prescribing both Plavix and Coumadin and that they are very well aware that he is taking both of these together.   He also reports that he has been having some discomfort near his right wrist over the past 2 weeks. Reports he had no trauma or injury. Reports he had no acute onset of pain or symptoms. Reports that he does not recall doing any repetitive motion or unusual activity around the time of onset. When he is just sitting at rest he does not feel much discomfort. However when he tries to lift anything is when he feels some achiness in the dorsal aspect, radial aspect of the distal forearm just above the level of the wrist. Says that last week --even just to lift the coffeepot-- would cause significant discomfort in that area. He says he does have a wrist splint at home that he used for prior carpal tunnel.  Says that he this one day but has not continued to use it.     Home Meds: See attached medication section for any medications that were entered at today's visit. The computer does not put those onto this list.The following list is a list of meds entered prior to today's visit.   Current Outpatient Prescriptions on File Prior to Visit  Medication Sig Dispense Refill  . Blood Glucose Monitoring Suppl (TRUETRACK BLOOD GLUCOSE) W/DEVICE KIT       . celecoxib (CELEBREX) 200 MG capsule Take 1 capsule (200 mg total) by mouth daily.  30 capsule  5  . clopidogrel (PLAVIX) 75 MG tablet Take 1 tablet (75 mg total) by mouth daily with breakfast.  30 tablet  6  . fish oil-omega-3 fatty acids 1000 MG capsule Take 1 g by mouth daily.        . Fluticasone-Salmeterol (ADVAIR) 250-50 MCG/DOSE AEPB Inhale 2 puffs into the lungs every 12 (twelve) hours.      . furosemide (LASIX) 20 MG tablet Take 1 tablet (20 mg total) by mouth daily.  90 tablet  3  . isosorbide mononitrate (IMDUR) 30 MG 24 hr tablet Take 1 tablet (30 mg total) by mouth daily.  30 tablet  6  . metoprolol succinate (TOPROL-XL) 50 MG 24 hr tablet Take 2  tablets (100 mg total) by mouth daily. Take with or immediately following a meal.  30 tablet  11  . nitroGLYCERIN (NITROSTAT) 0.4 MG SL tablet Place 0.4 mg under the tongue every 5 (five) minutes as needed for chest pain.      . pantoprazole (PROTONIX) 40 MG tablet Take 1 tablet (40 mg total) by mouth daily.  30 tablet  5  . rosuvastatin (CRESTOR) 20 MG tablet Take 1 tablet (20 mg total) by mouth at bedtime.  30 tablet  5  . sitaGLIPtin-metformin (JANUMET) 50-1000 MG per tablet Take 1 tablet by mouth 2 (two) times daily with a meal.      . TRUETRACK TEST test strip       . warfarin (COUMADIN) 5 MG tablet Take 2.5-5 mg by mouth daily at 6 PM. 5 mg on M W F, and 2.5 mg all other days       No current facility-administered medications on file prior to visit.    Allergies: No Known Allergies     Review of Systems: See HPI for pertinent ROS. All other ROS negative.    Physical Exam: Blood pressure 156/104, pulse 80, temperature 97.3 F (36.3 C), temperature source Oral, resp. rate 18, weight 229 lb (103.874 kg)., Body mass index is 33.08 kg/(m^2). General: WNWD/ Mildly overweight WM.  Appears in no acute distress. Neck: Supple. No thyromegaly. No lymphadenopathy. Lungs: Clear bilaterally to auscultation without wheezes, rales, or rhonchi. Breathing is unlabored. Heart: Irregular Msk:  Strength and tone normal for age. Right Wrist: The joint itself is normal.There  is no tenderness with palpation along the joint line. There is no significant swelling at joint line. No erythema there. The area that he points to as the area of discomfort is the radial aspect, dorsal aspect of the distal forearm--the area just proximal to the level of the wrist. However, even palpation of this area does not elicit any tenderness or pain. It is only when he moves his arm and rotates his wrist that he feels discomfort in that area. Extremities/Skin: Warm and dry.. No edema.  Neuro: Alert and oriented X 3. Moves all extremities spontaneously. Gait is normal. CNII-XII grossly in tact. Psych:  Responds to questions appropriately with a normal affect.   INR today is 3.2   ASSESSMENT AND PLAN:  63 y.o. year old male with    1. Long term (current) use of anticoagulants - PT with INR/Fingerstick  2. Chronic anticoagulation  3. Atrial fibrillation   INR is 3.2. He has been on current dose and has not required much adjustment in the past so will not change the dose now. Continue current dosing of 2.5 mg on all days except for 5 mg on Mondays /  Wednesdays /  Fridays. Return to clinic to repeat INR check 3 weeks.   4. Hypertension Today nurse checked blood pressure 2 different times and on each arm. Got 164/110 and 156/104. Patient states that he has taken all of his met blood pressure  medications as directed and has not had any skips doses. Also reviewed that his blood pressure has been borderline high at prior visits. 06/19/13--144/98 06/26/13---132/90 07/17/13----132/88 Will increase lisinopril from 10 mg to 20 mg daily. We'll have him return to clinic in 2-3 weeks we can recheck blood pressure and the mat on increased dose. He did have lab on 06/19/13. Potassium was 4.6 at that time. - lisinopril (PRINIVIL,ZESTRIL) 20 MG tablet; Take 1 tablet (20 mg total) by mouth daily.  Dispense: 30 tablet; Refill: 1  5. Right wrist tendonitis I recommended he start wearing his wrist splint daily. Sooner if the pain worsens. Otherwise we'll reassess this at his next visit as well.  F/U Office visit 3 weeks or sooner if needed.   Sooner if needed.   Signed, [address] [place], [place], [facility] 08/14/2013 11:15 AM

## 2013-08-15 ENCOUNTER — Encounter (HOSPITAL_COMMUNITY): Payer: Medicare HMO

## 2013-08-18 ENCOUNTER — Encounter: Payer: Self-pay | Admitting: Internal Medicine

## 2013-08-19 ENCOUNTER — Ambulatory Visit (INDEPENDENT_AMBULATORY_CARE_PROVIDER_SITE_OTHER): Payer: Commercial Managed Care - HMO | Admitting: *Deleted

## 2013-08-19 DIAGNOSIS — I442 Atrioventricular block, complete: Secondary | ICD-10-CM

## 2013-08-19 DIAGNOSIS — I4891 Unspecified atrial fibrillation: Secondary | ICD-10-CM

## 2013-08-19 DIAGNOSIS — I4892 Unspecified atrial flutter: Secondary | ICD-10-CM

## 2013-08-23 LAB — MDC_IDC_ENUM_SESS_TYPE_REMOTE
Battery Impedance: 100 Ohm
Battery Remaining Longevity: 135 mo
Battery Voltage: 2.8 V
Brady Statistic AP VP Percent: 87 %
Brady Statistic AP VS Percent: 0 %
Brady Statistic AS VP Percent: 12 %
Brady Statistic AS VS Percent: 0 %
Date Time Interrogation Session: 20150505011015
Lead Channel Impedance Value: 581 Ohm
Lead Channel Impedance Value: 723 Ohm
Lead Channel Pacing Threshold Amplitude: 0.75 V
Lead Channel Pacing Threshold Pulse Width: 0.4 ms
Lead Channel Setting Pacing Amplitude: 2 V
Lead Channel Setting Pacing Amplitude: 3.5 V
Lead Channel Setting Pacing Pulse Width: 0.4 ms
Lead Channel Setting Sensing Sensitivity: 4 mV

## 2013-08-28 ENCOUNTER — Encounter: Payer: Self-pay | Admitting: Cardiology

## 2013-08-29 NOTE — Progress Notes (Signed)
Remote pacemaker transmission.   

## 2013-09-04 ENCOUNTER — Ambulatory Visit: Payer: Medicare HMO | Admitting: Physician Assistant

## 2013-09-12 ENCOUNTER — Other Ambulatory Visit: Payer: Self-pay | Admitting: Physician Assistant

## 2013-09-12 NOTE — Telephone Encounter (Signed)
Medication refilled per protocol. 

## 2013-10-10 ENCOUNTER — Telehealth: Payer: Self-pay | Admitting: Family Medicine

## 2013-10-10 NOTE — Telephone Encounter (Signed)
Ok

## 2013-10-10 NOTE — Telephone Encounter (Signed)
Rec'd fax from [employer] to inform us of manufacturer change for the Warfarin.  PPA was called and change approved.  Looked in file to see when pt having next PT/INR done.  Found he had not had one done since 08/14/13.  His last visit instructed him to return in 3 weeks.  I called patient, he said he had canceled last appt because was Out of town.  Told him important that he have the PT/INR done.  Schedule him an appt for 10/20/13.  He said he would not have co-pay until then.

## 2013-10-20 ENCOUNTER — Ambulatory Visit (INDEPENDENT_AMBULATORY_CARE_PROVIDER_SITE_OTHER): Payer: Medicare HMO | Admitting: Physician Assistant

## 2013-10-20 ENCOUNTER — Encounter: Payer: Self-pay | Admitting: Physician Assistant

## 2013-10-20 VITALS — BP 150/100 | HR 80 | Temp 98.1°F | Resp 17 | Wt 230.0 lb

## 2013-10-20 DIAGNOSIS — I4891 Unspecified atrial fibrillation: Secondary | ICD-10-CM

## 2013-10-20 DIAGNOSIS — I4892 Unspecified atrial flutter: Secondary | ICD-10-CM

## 2013-10-20 DIAGNOSIS — Z7901 Long term (current) use of anticoagulants: Secondary | ICD-10-CM

## 2013-10-20 DIAGNOSIS — I4819 Other persistent atrial fibrillation: Secondary | ICD-10-CM

## 2013-10-20 LAB — PT WITH INR/FINGERSTICK
INR, fingerstick: 2.7 — ABNORMAL HIGH (ref 0.80–1.20)
PT, fingerstick: 32.2 seconds — ABNORMAL HIGH (ref 10.4–12.5)

## 2013-10-20 NOTE — Progress Notes (Signed)
Patient ID: [name] Cook MRN: [id number], DOB: 10-11-1950, 63 y.o. Date of Encounter: 10/20/2013, 8:56 AM    Chief Complaint:  Chief Complaint  Patient presents with  . Coagulation Disorder     HPI: 63 y.o. year old white male here to follow up PT/INR for his Coumadin dosing. He has A. fib/A. Flutter. He is still taking his Coumadin as directed which is 2.5 mg on all days except for 5 mg on Monday Wednesday Friday. He has had no skips doses and has not been on any different medications recently. He has had no bleeds and no bleeding gums or nosebleeds.     Home Meds:   Outpatient Prescriptions Prior to Visit  Medication Sig Dispense Refill  . Blood Glucose Monitoring Suppl (TRUETRACK BLOOD GLUCOSE) W/DEVICE KIT       . celecoxib (CELEBREX) 200 MG capsule Take 1 capsule (200 mg total) by mouth daily.  30 capsule  5  . clopidogrel (PLAVIX) 75 MG tablet Take 1 tablet (75 mg total) by mouth daily with breakfast.  30 tablet  6  . fish oil-omega-3 fatty acids 1000 MG capsule Take 1 g by mouth daily.        . Fluticasone-Salmeterol (ADVAIR) 250-50 MCG/DOSE AEPB Inhale 2 puffs into the lungs every 12 (twelve) hours.      . furosemide (LASIX) 20 MG tablet Take 1 tablet (20 mg total) by mouth daily.  90 tablet  3  . isosorbide mononitrate (IMDUR) 30 MG 24 hr tablet Take 1 tablet (30 mg total) by mouth daily.  30 tablet  6  . lisinopril (PRINIVIL,ZESTRIL) 20 MG tablet TAKE 1 TABLET BY MOUTH EVERY DAY  30 tablet  5  . metoprolol succinate (TOPROL-XL) 50 MG 24 hr tablet Take 2 tablets (100 mg total) by mouth daily. Take with or immediately following a meal.  30 tablet  11  . nitroGLYCERIN (NITROSTAT) 0.4 MG SL tablet Place 0.4 mg under the tongue every 5 (five) minutes as needed for chest pain.      . pantoprazole (PROTONIX) 40 MG tablet Take 1 tablet (40 mg total) by mouth daily.  30 tablet  5  . rosuvastatin (CRESTOR) 20 MG tablet Take 1 tablet (20 mg total) by mouth at bedtime.  30 tablet  5    . sitaGLIPtin-metformin (JANUMET) 50-1000 MG per tablet Take 1 tablet by mouth 2 (two) times daily with a meal.      . TRUETRACK TEST test strip       . warfarin (COUMADIN) 5 MG tablet Take 2.5-5 mg by mouth daily at 6 PM. 5 mg on M W F, and 2.5 mg all other days       No facility-administered medications prior to visit.    Allergies: No Known Allergies    Review of Systems: See HPI for pertinent ROS. All other ROS negative.    Physical Exam: Blood pressure 150/100, pulse 80, temperature 98.1 F (36.7 C), temperature source Oral, resp. rate 17, weight 230 lb (104.327 kg)., Body mass index is 33.23 kg/(m^2). General: WNWD WM.  Appears in no acute distress. Lungs: Clear bilaterally to auscultation without wheezes, rales, or rhonchi. Breathing is unlabored. Heart: Irregular rhythm. Msk:  Strength and tone normal for age. Extremities/Skin: Warm and dry. Neuro: Alert and oriented X 3. Moves all extremities spontaneously. Gait is normal. CNII-XII grossly in tact. Psych:  Responds to questions appropriately with a normal affect.   Results for orders placed in visit on 10/20/13  PT  WITH INR/FINGERSTICK      Result Value Ref Range   PT, fingerstick 32.2 (*) 10.4 - 12.5 seconds   INR, fingerstick 2.7 (*) 0.80 - 1.20     ASSESSMENT AND PLAN:  63 y.o. year old male with  1. Chronic anticoagulation  2. Atrial fibrillation, unspecified - PT with INR/Fingerstick  3. Atrial fibrillation  4. Atrial flutter, unspecified   Continue Current Coumadin dose of 2.5 mg every day except for 5 mg Monday Wednesday Friday. He already has a regular office visit scheduled for 11/06/13. Will recheck PT/INR at that visit.  Signed, [address] [place], [place], [facility] 10/20/2013 8:56 AM

## 2013-10-25 ENCOUNTER — Other Ambulatory Visit: Payer: Self-pay | Admitting: *Deleted

## 2013-10-25 DIAGNOSIS — I10 Essential (primary) hypertension: Secondary | ICD-10-CM

## 2013-10-25 DIAGNOSIS — N182 Chronic kidney disease, stage 2 (mild): Secondary | ICD-10-CM

## 2013-10-25 DIAGNOSIS — E119 Type 2 diabetes mellitus without complications: Secondary | ICD-10-CM

## 2013-10-25 DIAGNOSIS — E785 Hyperlipidemia, unspecified: Secondary | ICD-10-CM

## 2013-11-06 ENCOUNTER — Ambulatory Visit (INDEPENDENT_AMBULATORY_CARE_PROVIDER_SITE_OTHER): Payer: Medicare HMO | Admitting: Physician Assistant

## 2013-11-06 ENCOUNTER — Encounter: Payer: Self-pay | Admitting: Physician Assistant

## 2013-11-06 VITALS — BP 122/80 | HR 72 | Temp 97.2°F | Resp 18 | Wt 229.0 lb

## 2013-11-06 DIAGNOSIS — I2510 Atherosclerotic heart disease of native coronary artery without angina pectoris: Secondary | ICD-10-CM | POA: Insufficient documentation

## 2013-11-06 DIAGNOSIS — F17200 Nicotine dependence, unspecified, uncomplicated: Secondary | ICD-10-CM

## 2013-11-06 DIAGNOSIS — Z7901 Long term (current) use of anticoagulants: Secondary | ICD-10-CM

## 2013-11-06 DIAGNOSIS — J439 Emphysema, unspecified: Secondary | ICD-10-CM

## 2013-11-06 DIAGNOSIS — N182 Chronic kidney disease, stage 2 (mild): Secondary | ICD-10-CM

## 2013-11-06 DIAGNOSIS — Z950 Presence of cardiac pacemaker: Secondary | ICD-10-CM

## 2013-11-06 DIAGNOSIS — R7989 Other specified abnormal findings of blood chemistry: Secondary | ICD-10-CM

## 2013-11-06 DIAGNOSIS — Z720 Tobacco use: Secondary | ICD-10-CM

## 2013-11-06 DIAGNOSIS — K219 Gastro-esophageal reflux disease without esophagitis: Secondary | ICD-10-CM

## 2013-11-06 DIAGNOSIS — I4891 Unspecified atrial fibrillation: Secondary | ICD-10-CM

## 2013-11-06 DIAGNOSIS — J438 Other emphysema: Secondary | ICD-10-CM

## 2013-11-06 DIAGNOSIS — I10 Essential (primary) hypertension: Secondary | ICD-10-CM

## 2013-11-06 DIAGNOSIS — I4819 Other persistent atrial fibrillation: Secondary | ICD-10-CM

## 2013-11-06 DIAGNOSIS — E1165 Type 2 diabetes mellitus with hyperglycemia: Secondary | ICD-10-CM

## 2013-11-06 DIAGNOSIS — I4892 Unspecified atrial flutter: Secondary | ICD-10-CM

## 2013-11-06 DIAGNOSIS — R945 Abnormal results of liver function studies: Secondary | ICD-10-CM

## 2013-11-06 DIAGNOSIS — E119 Type 2 diabetes mellitus without complications: Secondary | ICD-10-CM

## 2013-11-06 DIAGNOSIS — E785 Hyperlipidemia, unspecified: Secondary | ICD-10-CM

## 2013-11-06 LAB — PT WITH INR/FINGERSTICK
INR, fingerstick: 3 — ABNORMAL HIGH (ref 0.80–1.20)
PT, fingerstick: 36.5 seconds — ABNORMAL HIGH (ref 10.4–12.5)

## 2013-11-06 LAB — HEMOGLOBIN A1C, FINGERSTICK: Hgb A1C (fingerstick): 6 % — ABNORMAL HIGH (ref <5.7)

## 2013-11-06 NOTE — Progress Notes (Signed)
Patient ID: [name] Cook MRN: [id number], DOB: 10-14-1950, 63 y.o. Date of Encounter: _0 @  Chief Complaint:  Chief Complaint  Patient presents with  . 3 mth visit    is fasting  . PT/INR    HPI: 63 y.o. year old white male  presents for followup office visit.  He had his first office visit with me on 06/19/2013. He had been seen in our office in the past by [name]. First visit with him was 01/05/2011. He only saw him one other visit and that  was 04/19/2011.  Since then, patient has been seeing  Cardiology.  [facility]  He had been having his PT INR checked at [facility].  However he wanted to start having it checked at our office. He presented here on 06/19/13 for PT/INR check and also to re- establish primary care here.  He is on Coumadin for history of atrial fibrillation/atrial flutter. Goal INR is 2-3. Prior to his visit with me 06/19/2013 he was taking 2.5 mg on all days except for 5 mg on Mondays /  Wednesdays /  Fridays. At his visit on 06/19/13 his INR was 4.0. At that visit he reported that he had been on that dose for a long time. INR has usually been therapeutic on that dose. Therefore I did not want to make a huge change in his medication dosing. Told him that for that Friday take 2.5 mg instead of 5 mg.  After that return to the above prior dosing. Today he states that he did take 2.5 mg on that one Friday but otherwise since then has been back to taking 2.5 mg all days except for 5 mg on Mondays /  Wednesdays / Fridays.  [facility] did fax me his Coumadin Clinic records.  I reviewed that in the past between 12/2011 - 09/2012 he was taking 2.5 mg instead of 5 mg on Wednesdays.  He was taking 5 mg on Mondays and Fridays only. Was taking 2.5 mg all other days. However, since 10/24/12 he has continued to be taking           2.5 mg every day except for 5 mg Mondays  /   Wednesdays /  Fridays  --   at every single check.  Also at last visit I reviewed labs in the computer and noticed that he has had no recent A1c or lipid panel but is on medications for diabetes and hyperlipidemia. Therefore we checked appropriate labs and scheduled him for followup visit 06/26/2013.  Last ROV with me was 06/26/13. No c/o today.    Past Medical History  Diagnosis Date  . Persistent atrial fibrillation     recurrent atrial flutter since 2001 s/p DCCVs, multiple failed AADs, h/o tachy-mediated cardiomyopathy  . Hypertension     with hypertensive heart disease  . Hyperlipidemia   . Atrial flutter     radiofrequency ablation in 2001  . Obesity   . Chronic anticoagulation     chronic Coumadin anticoagulation  . Tobacco abuse   . Chronic obstructive pulmonary disease 04/20/2011  . GERD (gastroesophageal reflux disease)   . CAD (coronary artery disease)     Nonobstructive. Cardiac cath in 2001-50% mid RI, normal LM, LAD, RCA  . Shortness of breath     "can come on at any time" (03/14/2013)  . Sleep apnea     "dx'd; couldn't wear the mask" (  03/14/2013)  . Diabetes mellitus, type 2   . Arthritis     "knees and lower back" (03/14/2013)     Home Meds:  Outpatient Prescriptions Prior to Visit  Medication Sig Dispense Refill  . Blood Glucose Monitoring Suppl (TRUETRACK BLOOD GLUCOSE) W/DEVICE KIT       . celecoxib (CELEBREX) 200 MG capsule Take 1 capsule (200 mg total) by mouth daily.  30 capsule  5  . clopidogrel (PLAVIX) 75 MG tablet Take 1 tablet (75 mg total) by mouth daily with breakfast.  30 tablet  6  . fish oil-omega-3 fatty acids 1000 MG capsule Take 1 g by mouth daily.        . Fluticasone-Salmeterol (ADVAIR) 250-50 MCG/DOSE AEPB Inhale 2 puffs into the lungs every 12 (twelve) hours.      . furosemide (LASIX) 20 MG tablet Take 1 tablet (20 mg total) by mouth daily.  90 tablet  3  . isosorbide mononitrate (IMDUR) 30 MG 24 hr tablet Take 1 tablet (30 mg total) by  mouth daily.  30 tablet  6  . lisinopril (PRINIVIL,ZESTRIL) 20 MG tablet TAKE 1 TABLET BY MOUTH EVERY DAY  30 tablet  5  . metoprolol succinate (TOPROL-XL) 50 MG 24 hr tablet Take 2 tablets (100 mg total) by mouth daily. Take with or immediately following a meal.  30 tablet  11  . nitroGLYCERIN (NITROSTAT) 0.4 MG SL tablet Place 0.4 mg under the tongue every 5 (five) minutes as needed for chest pain.      . pantoprazole (PROTONIX) 40 MG tablet Take 1 tablet (40 mg total) by mouth daily.  30 tablet  5  . rosuvastatin (CRESTOR) 20 MG tablet Take 1 tablet (20 mg total) by mouth at bedtime.  30 tablet  5  . sitaGLIPtin-metformin (JANUMET) 50-1000 MG per tablet Take 1 tablet by mouth 2 (two) times daily with a meal.      . TRUETRACK TEST test strip       . warfarin (COUMADIN) 5 MG tablet Take 2.5-5 mg by mouth daily at 6 PM. 5 mg on M W F, and 2.5 mg all other days       No facility-administered medications prior to visit.     Allergies: No Known Allergies  History   Social History  . Marital Status: Legally Separated    Spouse Name: N/A    Number of Children: 1  . Years of Education: N/A   Occupational History  . Unemployed   .     Social History Main Topics  . Smoking status: Former Smoker -- 1.00 packs/day for 42 years    Types: Cigarettes    Quit date: 12/31/2011  . Smokeless tobacco: Never Used  . Alcohol Use: Yes     Comment: 03/14/2013 "stopped drinking back in 2002; never had problem w/it"  . Drug Use: No  . Sexual Activity: Not Currently   Other Topics Concern  . 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          Family History  Problem Relation Age of Onset  . Alzheimer's disease Mother   . Osteoporosis Mother      Review of Systems:  See HPI for pertinent ROS. All other ROS negative.     Physical Exam: Blood pressure 122/80,  pulse 72, temperature 97.2 F (36.2 C), temperature source Oral, resp. rate 18, weight 229 lb (103.874 kg)., Body mass index is 33.08 kg/(m^2). General: WNWD WM. Appears in no acute distress. Neck: Supple. No thyromegaly. No lymphadenopathy. No carotid bruits. Lungs: Clear bilaterally to auscultation without wheezes, rales, or rhonchi. Breathing is unlabored. Heart: Irreg rhythm Abdomen: Soft, non-tender, non-distended with normoactive bowel sounds. No hepatomegaly. No rebound/guarding. No obvious abdominal masses. Musculoskeletal:  Strength and tone normal for age. Extremities/Skin: Warm and dry. No clubbing or cyanosis. No edema. No rashes or suspicious lesions. Neuro: Alert and oriented X 3. Moves all extremities spontaneously. Gait is normal. CNII-XII grossly in tact. Psych:  Responds to questions appropriately with a normal affect. Diabetic foot exam: Inspection is normal.  Sensation is intact and normal.  He has 2+ posterior tibial pulses bilaterally. Trace dorsalis pedis pulses bilaterally.    Results for orders placed in visit on 11/06/13  PT WITH INR/FINGERSTICK      Result Value Ref Range   PT, fingerstick 36.5 (*) 10.4 - 12.5 seconds   INR, fingerstick 3.0 (*) 0.80 - 1.20     ASSESSMENT AND PLAN:  63 y.o. year old male with   1. Coronary Artery Disease He underwent cardiac catheterization 03/17/2013. Was not favorable for PCI. He has had medical treatment of CAD.  1. Long term (current) use of anticoagulants - PT with INR/Fingerstick Continue current dosing of 2.5 mg every day except for 5 mg on Mondays Wednesdays Fridays Recheck PT/INR in one month.  2. Chronic anticoagulation 3. Atrial fibrillation 4. Atrial flutter 5. Pacemaker  6. Diabetes mellitus, type 2  - Microalbumin, urine---done 06/26/2013 Check A1C today  On 06/19/13 A1c was 6.3 --continue current meds for diabetes.  He states that he checks his blood sugar  about 3 times per week. Checks at different times of day. Never gets < 80. He is on ACE inhibitor. He is on statin therapy. NOT on ASA----He is on Plavix and Coumadin per Cardiology  Kidney function is normal. Foot exam is stable. Patient states that he had an eye exam about one year ago. Is due to schedule followup and patient plans to schedule this. He was told that exam showed "no diabetic damage "  7. Hypertension At goal. On ACE inhibitor. BMET normal.  8. Hyperlipidemia At lab 06/19/13 checked FLP and LFTs. LFTs normal. Lipid Panel is excellent. Cont Crestor 20 mg.  9. Chronic obstructive pulmonary disease Quit smoking 2013.  10. GASTROESOPHAGEAL REFLUX DISEASE Protonix can be used with Plavix to continue this one.  11. Will check a screening PSA and he is due for next lab draw.  12. Screening for colorectal cancer The following is copied form his OV on 06/26/2013:   "He reports that he has never had a colonoscopy. Says in the past he was going to -but that's when he then lost his insurance. He is agreeable to proceed with this and is agreeable for me to do her referral for followup with GI. - Ambulatory referral to Gastroenterology "  At f/u OV 11/06/2013 he reports: He cannot even afford the co-pay to see GI for the office visit. His co-pay for that visit was going to be $45. He does not even know what his cost would be for the colonoscopy itself.  13. Immunizations: He received Pneumovax 23 here in the office on 01/05/2011 He received tetanus here at our office 01/05/2011 He states that he had the influenza vaccine this year at the pharmacy  Plan for him to return here for PT-INR check in 4 weeks. Return for routine followup office visit in 3 months.   WILL NEED TO CHECK SCREENING PSA WITH NEXT COMPLETE LAB DRAW AT [facility] IN 3 MOS--01/2014    Signed, [address] [place], [place], [facility] 11/06/2013 9:39 AM

## 2013-12-10 ENCOUNTER — Other Ambulatory Visit: Payer: Self-pay | Admitting: Physician Assistant

## 2014-01-29 ENCOUNTER — Other Ambulatory Visit: Payer: Self-pay | Admitting: Physician Assistant

## 2014-01-29 NOTE — Telephone Encounter (Signed)
One month supply to cover until upcoming appt.

## 2014-02-05 ENCOUNTER — Encounter: Payer: Self-pay | Admitting: Family Medicine

## 2014-02-05 ENCOUNTER — Ambulatory Visit (INDEPENDENT_AMBULATORY_CARE_PROVIDER_SITE_OTHER): Payer: Medicare HMO | Admitting: Physician Assistant

## 2014-02-05 ENCOUNTER — Encounter: Payer: Self-pay | Admitting: Physician Assistant

## 2014-02-05 ENCOUNTER — Telehealth: Payer: Self-pay | Admitting: *Deleted

## 2014-02-05 VITALS — BP 138/90 | HR 80 | Temp 97.4°F | Resp 20 | Wt 229.0 lb

## 2014-02-05 DIAGNOSIS — I4819 Other persistent atrial fibrillation: Secondary | ICD-10-CM

## 2014-02-05 DIAGNOSIS — I481 Persistent atrial fibrillation: Secondary | ICD-10-CM

## 2014-02-05 DIAGNOSIS — J439 Emphysema, unspecified: Secondary | ICD-10-CM

## 2014-02-05 DIAGNOSIS — Z7901 Long term (current) use of anticoagulants: Secondary | ICD-10-CM

## 2014-02-05 DIAGNOSIS — N182 Chronic kidney disease, stage 2 (mild): Secondary | ICD-10-CM

## 2014-02-05 DIAGNOSIS — R945 Abnormal results of liver function studies: Secondary | ICD-10-CM

## 2014-02-05 DIAGNOSIS — I10 Essential (primary) hypertension: Secondary | ICD-10-CM

## 2014-02-05 DIAGNOSIS — R7989 Other specified abnormal findings of blood chemistry: Secondary | ICD-10-CM

## 2014-02-05 DIAGNOSIS — Z23 Encounter for immunization: Secondary | ICD-10-CM

## 2014-02-05 DIAGNOSIS — E1165 Type 2 diabetes mellitus with hyperglycemia: Secondary | ICD-10-CM

## 2014-02-05 DIAGNOSIS — Z720 Tobacco use: Secondary | ICD-10-CM

## 2014-02-05 DIAGNOSIS — E785 Hyperlipidemia, unspecified: Secondary | ICD-10-CM

## 2014-02-05 DIAGNOSIS — Z125 Encounter for screening for malignant neoplasm of prostate: Secondary | ICD-10-CM | POA: Insufficient documentation

## 2014-02-05 LAB — CBC WITH DIFFERENTIAL/PLATELET
Basophils Absolute: 0.1 10*3/uL (ref 0.0–0.1)
Basophils Relative: 1 % (ref 0–1)
Eosinophils Absolute: 0.2 10*3/uL (ref 0.0–0.7)
Eosinophils Relative: 4 % (ref 0–5)
HCT: 45.4 % (ref 39.0–52.0)
Hemoglobin: 15.6 g/dL (ref 13.0–17.0)
Lymphocytes Relative: 40 % (ref 12–46)
Lymphs Abs: 2.5 10*3/uL (ref 0.7–4.0)
MCH: 31.4 pg (ref 26.0–34.0)
MCHC: 34.4 g/dL (ref 30.0–36.0)
MCV: 91.3 fL (ref 78.0–100.0)
Monocytes Absolute: 0.7 10*3/uL (ref 0.1–1.0)
Monocytes Relative: 11 % (ref 3–12)
Neutro Abs: 2.7 10*3/uL (ref 1.7–7.7)
Neutrophils Relative %: 44 % (ref 43–77)
Platelets: 169 10*3/uL (ref 150–400)
RBC: 4.97 MIL/uL (ref 4.22–5.81)
RDW: 14.5 % (ref 11.5–15.5)
WBC: 6.2 10*3/uL (ref 4.0–10.5)

## 2014-02-05 LAB — PT WITH INR/FINGERSTICK
INR, fingerstick: 2.5 — ABNORMAL HIGH (ref 0.80–1.20)
PT, fingerstick: 30 seconds — ABNORMAL HIGH (ref 10.4–12.5)

## 2014-02-05 LAB — LIPID PANEL
Cholesterol: 183 mg/dL (ref 0–200)
HDL: 45 mg/dL (ref >39)
LDL Cholesterol: 112 mg/dL — ABNORMAL HIGH (ref 0–99)
Total CHOL/HDL Ratio: 4.1 Ratio
Triglycerides: 131 mg/dL (ref <150)
VLDL: 26 mg/dL (ref 0–40)

## 2014-02-05 LAB — HEMOGLOBIN A1C
Hgb A1c MFr Bld: 6.4 % — ABNORMAL HIGH (ref <5.7)
Mean Plasma Glucose: 137 mg/dL — ABNORMAL HIGH (ref <117)

## 2014-02-05 NOTE — Progress Notes (Signed)
Patient ID: [name] Cook MRN: [id number], DOB: 05/16/1950, 63 y.o. Date of Encounter: _0 @  Chief Complaint:  Chief Complaint  Patient presents with  . routine check up    labs and PT/INR,  is fasting    HPI: 63 y.o. year old white male  presents for followup office visit.  He had his first office visit with me on 06/19/2013.  He had been seen in our office in the past by [name]. First visit with him was 01/05/2011. He only saw him one other visit and that  was 04/19/2011.  Since then, patient has been seeing [facility].  [facility].  He had been having his PT INR checked at [facility].  However he wanted to start having it checked at our office.  He presented here on 06/19/13 for PT/INR check and also to re- establish primary care here.  He is on Coumadin for history of atrial fibrillation/atrial flutter. Goal INR is 2-3. He is taking 2.5 mg on all days except for 5 mg on Mondays /  Wednesdays /  Fridays. No skipped doses.  No bleeding.  No significant change in dietary intake of Vitamin K containing foods.  No change in medications.  He is taking his diabetic medications as directed. No adverse effects. He checks his blood sugar about 3 times per week at different times of day and always gets good readings but never gets low readings--never anything lower than 80.  He is taking blood pressure medications as directed. No lightheadedness. No lower extremity edema.  He is taking his Crestor as directed. No myalgias or other adverse effects.  Today he says that there was one thing he wanted to let me know about. Says that he's been having episodes of heart racing and feeling short of breath. Says that this is even woken him up from sleep at night sometimes.  No other complaints.  Past Medical History  Diagnosis Date  . Persistent atrial fibrillation     recurrent atrial flutter since 2001 s/p  DCCVs, multiple failed AADs, h/o tachy-mediated cardiomyopathy  . Hypertension     with hypertensive heart disease  . Hyperlipidemia   . Atrial flutter     radiofrequency ablation in 2001  . Obesity   . Chronic anticoagulation     chronic Coumadin anticoagulation  . Tobacco abuse   . Chronic obstructive pulmonary disease 04/20/2011  . GERD (gastroesophageal reflux disease)   . CAD (coronary artery disease)     Nonobstructive. Cardiac cath in 2001-50% mid RI, normal LM, LAD, RCA  . Shortness of breath     "can come on at any time" (03/14/2013)  . Sleep apnea     "dx'd; couldn't wear the mask" (03/14/2013)  . Diabetes mellitus, type 2   . Arthritis     "knees and lower back" (03/14/2013)     Home Meds:  Outpatient Prescriptions Prior to Visit  Medication Sig Dispense Refill  . ADVAIR DISKUS 250-50 MCG/DOSE AEPB TAKE 1 INHALATION BY MOUTH TWICE DAILY INTO LUNGS  60 each  4  . Blood Glucose Monitoring Suppl (TRUETRACK BLOOD GLUCOSE) W/DEVICE KIT       . celecoxib (CELEBREX) 200 MG capsule TAKE 1 CAPSULE BY MOUTH EVERY DAY  30 capsule  5  . clopidogrel (PLAVIX) 75 MG tablet TAKE 1 TABLET BY MOUTH EVERY DAY WITH BREAKFAST  30 tablet  4  . CRESTOR 20 MG tablet TAKE  1 TABLET BY MOUTH EVERY DAY BEFORE BEDTIME  30 tablet  4  . fish oil-omega-3 fatty acids 1000 MG capsule Take 1 g by mouth daily.        . furosemide (LASIX) 20 MG tablet Take 1 tablet (20 mg total) by mouth daily.  90 tablet  3  . isosorbide mononitrate (IMDUR) 30 MG 24 hr tablet TAKE 1 TABLET BY MOUTH EVERY DAY  30 tablet  4  . JANUMET 50-1000 MG per tablet TAKE 1 TABLET BY MOUTH TWICE DAILY  60 tablet  4  . lisinopril (PRINIVIL,ZESTRIL) 20 MG tablet TAKE 1 TABLET BY MOUTH EVERY DAY  30 tablet  5  . metoprolol succinate (TOPROL-XL) 50 MG 24 hr tablet TAKE 2 TABLETS BY MOUTH EVERY DAY WITH OR IMMEDIATELY FOLLOWING A MEAL  60 tablet  4  . nitroGLYCERIN (NITROSTAT) 0.4 MG SL tablet Place 0.4 mg under the tongue every 5 (five)  minutes as needed for chest pain.      . pantoprazole (PROTONIX) 40 MG tablet TAKE 1 TABLET BY MOUTH EVERY DAY  30 tablet  11  . TRUETRACK TEST test strip       . warfarin (COUMADIN) 5 MG tablet TAKE 1 TABLET ($RemoveB'5MG'tGPClmwm$ )BY MOUTH ON MON,WED,FRI AND TAKE 1/2 TABLET (2.$RemoveBefor'5MG'dfCTmwVXiCQS$ ) BY MOUTH ON SUN, TUE, THUR, AND SAT  21 tablet  0   No facility-administered medications prior to visit.     Allergies: No Known Allergies  History   Social History  . Marital Status: Legally Separated    Spouse Name: N/A    Number of Children: 1  . Years of Education: N/A   Occupational History  . Unemployed   .     Social History Main Topics  . Smoking status: Former Smoker -- 1.00 packs/day for 42 years    Types: Cigarettes    Quit date: 12/31/2011  . Smokeless tobacco: Never Used  . Alcohol Use: Yes     Comment: 03/14/2013 "stopped drinking back in 2002; never had problem w/it"  . Drug Use: No  . Sexual Activity: Not Currently   Other Topics Concern  . 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          Family History  Problem Relation Age of Onset  . Alzheimer's disease Mother   . Osteoporosis Mother      Review of Systems:  See HPI for pertinent ROS. All other ROS negative.    Physical Exam: Blood pressure 138/90, pulse 80, temperature 97.4 F (36.3 C), temperature source Oral, resp. rate 20, weight 229 lb (103.874 kg)., Body mass index is 33.08 kg/(m^2). General: WNWD WM. Appears in no acute distress. Neck: Supple. No thyromegaly. No lymphadenopathy. No carotid bruits. Lungs: Clear bilaterally to auscultation without wheezes, rales, or rhonchi. Breathing is unlabored. Heart: Irreg rhythm Abdomen: Soft, non-tender, non-distended with normoactive bowel sounds. No hepatomegaly. No rebound/guarding. No obvious abdominal  masses. Musculoskeletal:  Strength and tone normal for age. Extremities/Skin: Warm and dry. No clubbing or cyanosis. No edema. No rashes or suspicious lesions. Neuro: Alert and oriented X 3. Moves all extremities spontaneously. Gait is normal. CNII-XII grossly in tact. Psych:  Responds to questions appropriately with a normal affect. Diabetic foot exam: Inspection is normal.  Sensation is intact and normal.  He has 2+  posterior tibial pulses bilaterally. Trace dorsalis pedis pulses bilaterally.    Results for orders placed in visit on 02/05/14  PT WITH INR/FINGERSTICK      Result Value Ref Range   PT, fingerstick 30.0 (*) 10.4 - 12.5 seconds   INR, fingerstick 2.5 (*) 0.80 - 1.20     ASSESSMENT AND PLAN:  63 y.o. year old male with   1. Coronary Artery Disease He underwent cardiac catheterization 03/17/2013. Was not favorable for PCI. He has had medical treatment of CAD.   2 Long term (current) use of anticoagulants - PT with INR/Fingerstick Continue current dosing of 2.5 mg every day except for 5 mg on Mondays Wednesdays Fridays Recheck PT/INR in one month.  3. Chronic anticoagulation 4. Atrial fibrillation----------see HPI--Pt reports episodes of rapid heart rate associated with SOB--we have called [facility] and they are scheduling pt OV there.  5. Atrial flutter 6. Pacemaker  7. Diabetes mellitus, type 2  - Microalbumin, urine---done 06/26/2013 Check A1C today  He states that he checks his blood sugar about 3 times per week. Checks at different times of day. Never gets < 80. He is on ACE inhibitor. He is on statin therapy. NOT on ASA----He is on Plavix and Coumadin per Cardiology  Kidney function is normal. Foot exam is stable. Patient states that he had an eye exam about one year ago. Is due to schedule followup and patient plans to schedule this. He was told that exam showed "no diabetic damage "  He received Pneumovax 23 here 01/05/2011 No further  pneumonia vaccine indicated until age 29  8. Hypertension At goal. On ACE inhibitor. BMET normal.  9. Hyperlipidemia At lab 06/19/13 checked FLP and LFTs. LFTs normal. Lipid Panel is excellent. Cont Crestor 20 mg.  10. Chronic obstructive pulmonary disease Quit smoking 2013.  11. GASTROESOPHAGEAL REFLUX DISEASE Protonix can be used with Plavix to continue this one.  12. Prostate cancer screening - PSA, Medicare  13. Screening for colorectal cancer The following is copied form his OV on 06/26/2013:   "He reports that he has never had a colonoscopy. Says in the past he was going to -but that's when he then lost his insurance. He is agreeable to proceed with this and is agreeable for me to do her referral for followup with GI. - Ambulatory referral to Gastroenterology "  At f/u OV 11/06/2013 he reports: He cannot even afford the co-pay to see GI for the office visit. His co-pay for that visit was going to be $45. He does not even know what his cost would be for the colonoscopy itself.  14. Immunizations: He received Pneumovax 23 here in the office on 01/05/2011 No further pneumonia vaccine indicated until age 22. He received tetanus here at our office 01/05/2011 Influenza Vaccine--Agreeable to receive here today  Plan for him to return here for PT-INR check in 4 weeks. Return for routine followup office visit in 3 months.      Signed, [address] [place], [place], [facility] 02/05/2014 9:45 AM

## 2014-02-05 NOTE — Telephone Encounter (Signed)
Submitted [employer] referral thru [employer] connect for authorization for Cardiology in [place] [name], authorization number is [id number], once receive paper copy from [facility] will fax to [name] office

## 2014-02-06 LAB — PSA, MEDICARE: PSA: 4.91 ng/mL — ABNORMAL HIGH (ref <=4.00)

## 2014-02-06 NOTE — Progress Notes (Signed)
HPI: [name] is a 63 year old patient of Dr. Osie Cheeks, that we follow for ongoing assessment and management of paroxysmal atrial fibrillation, which is now become persistent, status post AV nodal ablation and insertion of dual-chamber placement. He was last seen by [name] in January 2015 after undergoing exercise treadmill testing, which demonstrated blunted chronotropic response. His pacemaker was reprogrammed making his rate response, more aggressive. The patient continues on anticoagulation therapy with Coumadin.  He was seen at [facility] at family practice by Orlie Pollen PA on 02/05/2014. During that office visit is been documented. He complained of episodes of racing heart and feeling short of breath, having episodes, that awoke him from sleep at night on occasion. As a result of this, he is asked to see [place] in the office for possible change medication regimen. He will need to have his pacemaker interrogated to evaluate.  He today stating he is having frequent palpitations often times he feels in the night, which we cannot and he feels his blood pressure is elevated, sometimes he has a, while he is up exerting himself during the day and finds his blood pressure also arises very highly with blood pressure diastolically over 177 by his blood pressure machine. Heart rate is gone at this time is 105 in the middle of the night. He is not come to have his pacemaker interrogated. It seems been having family issues and missing appointments. On occasion when his heart rate elevated. He feels some chest discomfort. He has on occasion, taken nitroglycerin.   No Known Allergies  Current Outpatient Prescriptions  Medication Sig Dispense Refill  . ADVAIR DISKUS 250-50 MCG/DOSE AEPB TAKE 1 INHALATION BY MOUTH TWICE DAILY INTO LUNGS  60 each  4  . Blood Glucose Monitoring Suppl (TRUETRACK BLOOD GLUCOSE) W/DEVICE KIT       . celecoxib (CELEBREX) 200 MG capsule TAKE 1 CAPSULE BY MOUTH EVERY DAY   30 capsule  5  . clopidogrel (PLAVIX) 75 MG tablet TAKE 1 TABLET BY MOUTH EVERY DAY WITH BREAKFAST  30 tablet  4  . CRESTOR 20 MG tablet TAKE 1 TABLET BY MOUTH EVERY DAY BEFORE BEDTIME  30 tablet  4  . fish oil-omega-3 fatty acids 1000 MG capsule Take 1 g by mouth daily.        . furosemide (LASIX) 20 MG tablet Take 1 tablet (20 mg total) by mouth daily.  90 tablet  3  . isosorbide mononitrate (IMDUR) 30 MG 24 hr tablet TAKE 1 TABLET BY MOUTH EVERY DAY  30 tablet  4  . JANUMET 50-1000 MG per tablet TAKE 1 TABLET BY MOUTH TWICE DAILY  60 tablet  4  . lisinopril (PRINIVIL,ZESTRIL) 20 MG tablet TAKE 1 TABLET BY MOUTH EVERY DAY  30 tablet  5  . metoprolol succinate (TOPROL-XL) 50 MG 24 hr tablet Take 150 mg 2 times daily  60 tablet  4  . nitroGLYCERIN (NITROSTAT) 0.4 MG SL tablet Place 0.4 mg under the tongue every 5 (five) minutes as needed for chest pain.      . pantoprazole (PROTONIX) 40 MG tablet TAKE 1 TABLET BY MOUTH EVERY DAY  30 tablet  11  . TRUETRACK TEST test strip       . warfarin (COUMADIN) 5 MG tablet TAKE 1 TABLET (5MG)BY MOUTH ON MON,WED,FRI AND TAKE 1/2 TABLET (2.5MG) BY MOUTH ON SUN, TUE, THUR, AND SAT  21 tablet  0   No current facility-administered medications for this visit.    Past Medical  History  Diagnosis Date  . Persistent atrial fibrillation     recurrent atrial flutter since 2001 s/p DCCVs, multiple failed AADs, h/o tachy-mediated cardiomyopathy  . Hypertension     with hypertensive heart disease  . Hyperlipidemia   . Atrial flutter     radiofrequency ablation in 2001  . Obesity   . Chronic anticoagulation     chronic Coumadin anticoagulation  . Tobacco abuse   . Chronic obstructive pulmonary disease 04/20/2011  . GERD (gastroesophageal reflux disease)   . CAD (coronary artery disease)     Nonobstructive. Cardiac cath in 2001-50% mid RI, normal LM, LAD, RCA  . Shortness of breath     "can come on at any time" (03/14/2013)  . Sleep apnea     "dx'd; couldn't  wear the mask" (03/14/2013)  . Diabetes mellitus, type 2   . Arthritis     "knees and lower back" (03/14/2013)    Past Surgical History  Procedure Laterality Date  . Atrial flutter ablation  2002    atrial flutter; subsequently developed atrial fibrillation  . Loop recorder implant  2002  . Cardioversion  05/31/2011    Procedure: CARDIOVERSION;  Surgeon: Cristopher Estimable. Lattie Haw, MD;  Location: [facility];  Service: Cardiovascular;  Laterality: N/A;  . Av node ablation  01/24/2013  . Insert / replace / remove pacemaker  01/24/2013     Medtronic Adapta L dual-chamber pacemaker, serial number [id number]   . Carpal tunnel release Left 1980's  . Loop recorder explant  ~ 2003  . Cardiac catheterization  2002    YWV:XUCJAR of systems complete and found to be negative unless listed above  PHYSICAL EXAM BP 140/98  Pulse 84  Ht 6' (1.829 m)  Wt 227 lb (102.967 kg)  BMI 30.78 kg/m2 General: Well developed, well nourished, in no acute distress Head: Eyes PERRLA, No xanthomas.   Normal cephalic and atramatic  Lungs: Clear bilaterally to auscultation and percussion. Heart: HRRR S1 S2, without MRG.  Pulses are 2+ & equal.            No carotid bruit. No JVD.  No abdominal bruits. No femoral bruits. Abdomen: Bowel sounds are positive, abdomen soft and non-tender without masses or                  Hernia's noted. Msk:  Back normal, normal gait. Normal strength and tone for age. Extremities: No clubbing, cyanosis or edema.  DP +1 Neuro: Alert and oriented X 3. Psych:  Good affect, responds appropriately   EKG: AV pacing. Rate of 105 beats per minute.  ASSESSMENT AND PLAN

## 2014-02-09 ENCOUNTER — Encounter: Payer: Self-pay | Admitting: Family Medicine

## 2014-02-09 ENCOUNTER — Ambulatory Visit (INDEPENDENT_AMBULATORY_CARE_PROVIDER_SITE_OTHER): Payer: Commercial Managed Care - HMO | Admitting: Adult Health

## 2014-02-09 ENCOUNTER — Encounter: Payer: Self-pay | Admitting: Adult Health

## 2014-02-09 VITALS — BP 140/98 | HR 84 | Ht 72.0 in | Wt 227.0 lb

## 2014-02-09 DIAGNOSIS — Z720 Tobacco use: Secondary | ICD-10-CM

## 2014-02-09 DIAGNOSIS — I4819 Other persistent atrial fibrillation: Secondary | ICD-10-CM

## 2014-02-09 DIAGNOSIS — R972 Elevated prostate specific antigen [PSA]: Secondary | ICD-10-CM | POA: Insufficient documentation

## 2014-02-09 DIAGNOSIS — R079 Chest pain, unspecified: Secondary | ICD-10-CM

## 2014-02-09 DIAGNOSIS — I481 Persistent atrial fibrillation: Secondary | ICD-10-CM

## 2014-02-09 DIAGNOSIS — I10 Essential (primary) hypertension: Secondary | ICD-10-CM

## 2014-02-09 MED ORDER — METOPROLOL SUCCINATE ER 50 MG PO TB24: ORAL_TABLET | ORAL | Status: DC

## 2014-02-09 NOTE — Assessment & Plan Note (Addendum)
O. increases, the Toprol, 250 mg twice a day from 100 mg twice a day. Her lites have his pacemaker interrogated. I have called the pacemaker clinic in [place] to have his pacemaker checked remotely. He is to follow up with [name] for ongoing assessment of his racing heart rate and for further evaluation and pacemaker interrogation as necessary. He is to continue anticoagulation therapy. His doses are being monitored by his primary care physician, as it is less expensive for him.

## 2014-02-09 NOTE — Patient Instructions (Signed)
Your physician recommends that you schedule a follow-up appointment with [name] and for a remote pacer check.  Your physician has recommended you make the following change in your medication:   Take 150 mg twice daily of metoprolol   We have your new phone number and pharmacy   Thank you for choosing [facility]!!

## 2014-02-09 NOTE — Assessment & Plan Note (Signed)
He states he quit smoking.

## 2014-02-09 NOTE — Assessment & Plan Note (Signed)
Blood pressure slightly elevated on this visit at 140/98. Was increased his metoprolol. This should normalize. We will have him come back in a month for reevaluation of his symptoms.

## 2014-02-09 NOTE — Progress Notes (Deleted)
Name: [name] Cook    DOB: June 15, 1950  Age: 63 y.o.  MR#: [id number]       PCP:  Karis Juba, PA-C      Insurance: Payor: [employer] MEDICARE / Plan: [employer] H[employer] / Product Type: *No Product type* /   CC:    Chief Complaint  Patient presents with  . Palpitations  . Atrial Fibrillation    VS Filed Vitals:   02/09/14 1400  BP: 140/98  Pulse: 84  Height: 6' (1.829 m)  Weight: 227 lb (102.967 kg)    Weights Current Weight  02/09/14 227 lb (102.967 kg)  02/05/14 229 lb (103.874 kg)  11/06/13 229 lb (103.874 kg)    Blood Pressure  BP Readings from Last 3 Encounters:  02/09/14 140/98  02/05/14 138/90  11/06/13 122/80     Admit date:  (Not on file) Last encounter with RMR:  Visit date not found   Allergy Review of patient's allergies indicates no known allergies.  Current Outpatient Prescriptions  Medication Sig Dispense Refill  . ADVAIR DISKUS 250-50 MCG/DOSE AEPB TAKE 1 INHALATION BY MOUTH TWICE DAILY INTO LUNGS  60 each  4  . Blood Glucose Monitoring Suppl (TRUETRACK BLOOD GLUCOSE) W/DEVICE KIT       . celecoxib (CELEBREX) 200 MG capsule TAKE 1 CAPSULE BY MOUTH EVERY DAY  30 capsule  5  . clopidogrel (PLAVIX) 75 MG tablet TAKE 1 TABLET BY MOUTH EVERY DAY WITH BREAKFAST  30 tablet  4  . CRESTOR 20 MG tablet TAKE 1 TABLET BY MOUTH EVERY DAY BEFORE BEDTIME  30 tablet  4  . fish oil-omega-3 fatty acids 1000 MG capsule Take 1 g by mouth daily.        . furosemide (LASIX) 20 MG tablet Take 1 tablet (20 mg total) by mouth daily.  90 tablet  3  . isosorbide mononitrate (IMDUR) 30 MG 24 hr tablet TAKE 1 TABLET BY MOUTH EVERY DAY  30 tablet  4  . JANUMET 50-1000 MG per tablet TAKE 1 TABLET BY MOUTH TWICE DAILY  60 tablet  4  . lisinopril (PRINIVIL,ZESTRIL) 20 MG tablet TAKE 1 TABLET BY MOUTH EVERY DAY  30 tablet  5  . metoprolol succinate (TOPROL-XL) 50 MG 24 hr tablet TAKE 2 TABLETS BY MOUTH EVERY DAY WITH OR IMMEDIATELY FOLLOWING A MEAL  60 tablet  4  . nitroGLYCERIN  (NITROSTAT) 0.4 MG SL tablet Place 0.4 mg under the tongue every 5 (five) minutes as needed for chest pain.      . pantoprazole (PROTONIX) 40 MG tablet TAKE 1 TABLET BY MOUTH EVERY DAY  30 tablet  11  . TRUETRACK TEST test strip       . warfarin (COUMADIN) 5 MG tablet TAKE 1 TABLET (5MG)BY MOUTH ON MON,WED,FRI AND TAKE 1/2 TABLET (2.5MG) BY MOUTH ON SUN, TUE, THUR, AND SAT  21 tablet  0   No current facility-administered medications for this visit.    Discontinued Meds:   There are no discontinued medications.  Patient Active Problem List   Diagnosis Date Noted  . Prostate cancer screening 02/05/2014  . Coronary artery disease 11/06/2013  . Atrial fibrillation 07/17/2013  . Long term (current) use of anticoagulants 06/19/2013  . Pacemaker 04/30/2013  . Acute myocardial infarction, subendocardial infarction, initial episode of care 03/15/2013  . Chest pain, midsternal 03/14/2013  . Hypokalemia 01/15/2013  . Abnormal LFTs 05/02/2012  . Chronic kidney disease, 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/26/2011  . Chronic obstructive pulmonary disease 04/20/2011  .  Atrial fibrillation   . Hypertension   . Hyperlipidemia   . Diabetes mellitus, type 2   . Atrial flutter   . Tobacco abuse   . Chronic anticoagulation 04/06/2011  . GASTROESOPHAGEAL REFLUX DISEASE 05/16/2010    LABS    Component Value Date/Time   NA 138 06/19/2013 1123   NA 140 05/03/2013 1402   NA 140 03/31/2013 0942   K 4.6 06/19/2013 1123   K 4.1 05/03/2013 1402   K 4.0 03/31/2013 0942   CL 104 06/19/2013 1123   CL 100 05/03/2013 1402   CL 105 03/31/2013 0942   CO2 25 06/19/2013 1123   CO2 27 05/03/2013 1402   CO2 30 03/31/2013 0942   GLUCOSE 118* 06/19/2013 1123   GLUCOSE 114* 05/03/2013 1402   GLUCOSE 128* 03/31/2013 0942   BUN 16 06/19/2013 1123   BUN 20 05/03/2013 1402   BUN 17 03/31/2013 0942   CREATININE 1.12 06/19/2013 1123   CREATININE 1.09 05/03/2013 1402   CREATININE 1.16 03/31/2013 0942   CREATININE 1.10 03/15/2013 0635    CREATININE 1.06 03/14/2013 0538   CREATININE 1.16 07/15/2012 1215   CALCIUM 9.2 06/19/2013 1123   CALCIUM 10.0 05/03/2013 1402   CALCIUM 9.3 03/31/2013 0942   GFRNONAA 71 06/19/2013 1123   GFRNONAA 72* 05/03/2013 1402   GFRNONAA 71* 03/15/2013 0635   GFRNONAA 74* 03/14/2013 0538   GFRAA 82 06/19/2013 1123   GFRAA 83* 05/03/2013 1402   GFRAA 82* 03/15/2013 0635   GFRAA 86* 03/14/2013 0538   CMP     Component Value Date/Time   NA 138 06/19/2013 1123   K 4.6 06/19/2013 1123   CL 104 06/19/2013 1123   CO2 25 06/19/2013 1123   GLUCOSE 118* 06/19/2013 1123   BUN 16 06/19/2013 1123   CREATININE 1.12 06/19/2013 1123   CREATININE 1.09 05/03/2013 1402   CALCIUM 9.2 06/19/2013 1123   PROT 6.5 06/19/2013 1123   ALBUMIN 4.0 06/19/2013 1123   AST 19 06/19/2013 1123   ALT 21 06/19/2013 1123   ALKPHOS 57 06/19/2013 1123   BILITOT 0.4 06/19/2013 1123   GFRNONAA 71 06/19/2013 1123   GFRNONAA 72* 05/03/2013 1402   GFRAA 82 06/19/2013 1123   GFRAA 83* 05/03/2013 1402       Component Value Date/Time   WBC 6.2 02/05/2014 1001   WBC 6.4 06/19/2013 1123   WBC 6.3 05/03/2013 1402   HGB 15.6 02/05/2014 1001   HGB 14.5 06/19/2013 1123   HGB 15.3 05/03/2013 1402   HCT 45.4 02/05/2014 1001   HCT 40.9 06/19/2013 1123   HCT 42.6 05/03/2013 1402   MCV 91.3 02/05/2014 1001   MCV 89.7 06/19/2013 1123   MCV 91.2 05/03/2013 1402    Lipid Panel     Component Value Date/Time   CHOL 183 02/05/2014 1001   TRIG 131 02/05/2014 1001   HDL 45 02/05/2014 1001   CHOLHDL 4.1 02/05/2014 1001   VLDL 26 02/05/2014 1001   LDLCALC 112* 02/05/2014 1001   LDLCALC 119 05/05/2008    ABG No results found for this basename: phart, pco2, pco2art, po2, po2art, hco3, tco2, acidbasedef, o2sat     Lab Results  Component Value Date   TSH 1.459 03/14/2013   BNP (last 3 results)  Recent Labs  03/14/13 0538  PROBNP 799.6*   Cardiac Panel (last 3 results) No results found for this basename: CKTOTAL, CKMB, TROPONINI, RELINDX,  in the last 72 hours   Iron/TIBC/Ferritin/ %Sat No results found for this basename:  iron, tibc, ferritin, ironpctsat     EKG Orders placed during the hospital encounter of 05/03/13  . ED EKG  . ED EKG  . EKG 12-LEAD  . EKG 12-LEAD  . EKG     Prior Assessment and Plan Problem List as of 02/09/2014     Cardiovascular and Mediastinum   Atrial fibrillation   Last Assessment & Plan   04/30/2013 Office Visit Written 04/30/2013  2:17 PM by [name], MD     His ventricular rate is now well-controlled, and he is pacing 99% of the time. He will continue his current medical therapy.    Hypertension   Last Assessment & Plan   12/25/2012 Office Visit Written 12/25/2012 10:32 AM by [name], MD     On medical therapy, his blood pressure is well controlled. He will maintain a low-sodium diet. No change in medical therapy.    Atrial flutter   Last Assessment & Plan   09/26/2012 Office Visit Written 09/26/2012  2:26 PM by Lendon Colonel, NP     He will have appt made with Dr.Taylor as he is overdue to have this.     Acute myocardial infarction, subendocardial infarction, initial episode of care   Atrial fibrillation   Coronary artery disease     Respiratory   Chronic obstructive pulmonary disease   Last Assessment & Plan   04/20/2011 Office Visit Written 04/23/2011 10:03 AM by Yehuda Savannah, MD     Active bronchospasm with symptoms.  Advair will be added to patient's regimen.  Health Department verifies that this medication can be supplied to them and no cost.  Dose of metoprolol will be decreased substantially and it may be necessary to discontinue this drug entirely.      Digestive   GASTROESOPHAGEAL REFLUX DISEASE     Endocrine   Diabetes mellitus, type 2     Genitourinary   Chronic kidney disease, stage II (mild)     Other   Chronic anticoagulation   Last Assessment & Plan   11/30/2011 Office Visit Written 11/30/2011 11:19 AM by Yehuda Savannah, MD     INRs continue to be variable,  but are improved, ranging between 1.6-2.7.  We will continue to monitor closely and adjust warfarin dosage in anticoagulation clinic.    Hyperlipidemia   Last Assessment & Plan   05/26/2011 Office Visit Edited 05/26/2011  3:35 PM by Yehuda Savannah, MD     Control of hyperlipidemia is superlative; current therapy will be continued.    Tobacco abuse   Abnormal LFTs   Last Assessment & Plan   08/29/2012 Office Visit Written 08/29/2012  3:17 PM by Yehuda Savannah, MD     Mild abnormalities of LFTs apparently secondary to amiodarone. Despite evidence for hepatic steatosis, abnormalities normalized following drug discontinuation. Alternative antiarrhythmic for treatment of atrial fibrillation will be selected if required.    Hypokalemia   Chest pain, midsternal   Pacemaker   Last Assessment & Plan   04/30/2013 Office Visit Written 04/30/2013  2:17 PM by [name], MD     His Medtronic dual-chamber device is working normally. We'll plan to recheck in several months.    Long term (current) use of anticoagulants   Prostate cancer screening       Imaging: No results found.

## 2014-02-11 LAB — COMPLETE METABOLIC PANEL WITH GFR
ALT: 24 U/L (ref 0–53)
AST: 25 U/L (ref 0–37)
Albumin: 4.6 g/dL (ref 3.5–5.2)
Alkaline Phosphatase: 60 U/L (ref 39–117)
BUN: 13 mg/dL (ref 6–23)
CO2: 16 mEq/L — ABNORMAL LOW (ref 19–32)
Calcium: 9.5 mg/dL (ref 8.4–10.5)
Chloride: 100 mEq/L (ref 96–112)
Creat: 1.27 mg/dL (ref 0.50–1.35)
GFR, Est African American: 70 mL/min
GFR, Est Non African American: 61 mL/min
Glucose, Bld: 148 mg/dL — ABNORMAL HIGH (ref 70–99)
Potassium: 4.8 mEq/L (ref 3.5–5.3)
Sodium: 138 mEq/L (ref 135–145)
Total Bilirubin: 0.6 mg/dL (ref 0.2–1.2)
Total Protein: 6.6 g/dL (ref 6.0–8.3)

## 2014-02-17 ENCOUNTER — Other Ambulatory Visit: Payer: Self-pay | Admitting: Physician Assistant

## 2014-02-17 ENCOUNTER — Telehealth: Payer: Self-pay | Admitting: *Deleted

## 2014-02-18 ENCOUNTER — Telehealth: Payer: Self-pay | Admitting: Internal Medicine

## 2014-02-18 NOTE — Telephone Encounter (Signed)
Medication questions / tgs

## 2014-02-18 NOTE — Telephone Encounter (Signed)
Medication refilled per protocol. 

## 2014-02-19 ENCOUNTER — Ambulatory Visit (INDEPENDENT_AMBULATORY_CARE_PROVIDER_SITE_OTHER): Payer: Commercial Managed Care - HMO | Admitting: *Deleted

## 2014-02-19 ENCOUNTER — Encounter: Payer: Self-pay | Admitting: Internal Medicine

## 2014-02-19 DIAGNOSIS — I442 Atrioventricular block, complete: Secondary | ICD-10-CM

## 2014-02-19 DIAGNOSIS — I495 Sick sinus syndrome: Secondary | ICD-10-CM

## 2014-02-19 LAB — MDC_IDC_ENUM_SESS_TYPE_REMOTE
Battery Remaining Longevity: 132 mo
Battery Voltage: 2.8 V
Brady Statistic AP VP Percent: 88.2 %
Brady Statistic AP VS Percent: 0.1 %
Brady Statistic AS VP Percent: 11.5 %
Brady Statistic AS VS Percent: 0.2 %
Lead Channel Impedance Value: 573 Ohm
Lead Channel Impedance Value: 767 Ohm
Lead Channel Pacing Threshold Amplitude: 0.625 V
Lead Channel Pacing Threshold Pulse Width: 0.4 ms
Lead Channel Setting Pacing Amplitude: 2 V
Lead Channel Setting Pacing Amplitude: 3.5 V
Lead Channel Setting Pacing Pulse Width: 0.4 ms
Lead Channel Setting Sensing Sensitivity: 4 mV

## 2014-02-19 NOTE — Progress Notes (Signed)
Remote pacemaker transmission.   

## 2014-02-26 NOTE — Telephone Encounter (Signed)
ERROR

## 2014-03-04 ENCOUNTER — Encounter: Payer: Self-pay | Admitting: Cardiology

## 2014-03-09 ENCOUNTER — Encounter: Payer: Self-pay | Admitting: Physician Assistant

## 2014-03-09 ENCOUNTER — Ambulatory Visit (INDEPENDENT_AMBULATORY_CARE_PROVIDER_SITE_OTHER): Payer: Medicare HMO | Admitting: Physician Assistant

## 2014-03-09 VITALS — BP 154/86 | HR 80 | Temp 97.6°F | Resp 18 | Wt 233.0 lb

## 2014-03-09 DIAGNOSIS — Z7901 Long term (current) use of anticoagulants: Secondary | ICD-10-CM

## 2014-03-09 DIAGNOSIS — I4819 Other persistent atrial fibrillation: Secondary | ICD-10-CM

## 2014-03-09 DIAGNOSIS — F329 Major depressive disorder, single episode, unspecified: Secondary | ICD-10-CM

## 2014-03-09 DIAGNOSIS — F32A Depression, unspecified: Secondary | ICD-10-CM | POA: Insufficient documentation

## 2014-03-09 DIAGNOSIS — I481 Persistent atrial fibrillation: Secondary | ICD-10-CM

## 2014-03-09 LAB — PT WITH INR/FINGERSTICK
INR, fingerstick: 2.1 — ABNORMAL HIGH (ref 0.80–1.20)
PT, fingerstick: 25.3 seconds — ABNORMAL HIGH (ref 10.4–12.5)

## 2014-03-09 MED ORDER — BUPROPION HCL ER (SR) 150 MG PO TB12: 150.0000 mg | ORAL_TABLET | Freq: Two times a day (BID) | ORAL | Status: DC

## 2014-03-09 NOTE — Progress Notes (Signed)
Patient ID: [name] Cook MRN: [id number], DOB: 1951-04-03, 63 y.o. Date of Encounter: '@DATE' @  Chief Complaint:  Chief Complaint  Patient presents with  . PT/INR check    HPI: 63 y.o. year old white male  presents for followup office visit. Here to check his PT/INR.   He had his first office visit with me on 06/19/2013.  He had been seen in our office in the past by [name]. First visit with him was 01/05/2011. He only saw him one other visit and that  was 04/19/2011.  Since then, patient has been seeing [facility].  Had also gone to the [facility] and [facility].  He had been having his PT INR checked at [facility].  However he wanted to start having it checked at our office.  He presented here on 06/19/13 for PT/INR check and also to re- establish primary care here.  He is on Coumadin for history of atrial fibrillation/atrial flutter. Goal INR is 2-3. He is taking 2.5 mg on all days except for 5 mg on Mondays /  Wednesdays /  Fridays. No skipped doses.  No bleeding.  No significant change in dietary intake of Vitamin K containing foods.  No change in medications.  He is taking his diabetic medications as directed. No adverse effects. He checks his blood sugar about 3 times per week at different times of day and always gets good readings but never gets low readings--never anything lower than 80.  He is taking blood pressure medications as directed. No lightheadedness. No lower extremity edema.  He is taking his Crestor as directed. No myalgias or other adverse effects.  At his last office visit with me he reported  that there was one thing he wanted to let me know about. Says that he's been having episodes of heart racing and feeling short of breath. Says that this is even woken him up from sleep at night sometimes. Today I did review Epic and he has seen cardiology since then and they did increase his dose of Toprol. He does have a  follow-up appointment scheduled to see Dr. Crissie Sickles.  Today he says that he did want to discuss one thing. Says that he has been on medications for depression in the past but then he was without insurance for a while so he had gone off of those medications. He is feeling that he does need to get back on medication for this.  Says that he does not actually have episodes of crying but says that he does feel depressed and feels like he is not really interested in doing things that he used to find pleasure in. He says that there are no unusual stressors going on in his life right now says that things are same as usual but he just doesn't feel right. Feels depressed as above. Also says that it seems to be affecting his sleep and that he will sometimes wake up in the night thinking of things. Says that [name] had him on a medication that he prescribed here but says that he doesn't think that worked very well but cannot remember the name of it. I reviewed his chart and it looks like that was Lexapro. Patient says that he also knows in the past he has been on Wellbutrin at one point and was on Prozac at one point. He says that he remembers that the Wellbutrin worked well for him.  No other complaints.  Past Medical  History  Diagnosis Date  . Persistent atrial fibrillation     recurrent atrial flutter since 2001 s/p DCCVs, multiple failed AADs, h/o tachy-mediated cardiomyopathy  . Hypertension     with hypertensive heart disease  . Hyperlipidemia   . Atrial flutter     radiofrequency ablation in 2001  . Obesity   . Chronic anticoagulation     chronic Coumadin anticoagulation  . Tobacco abuse   . Chronic obstructive pulmonary disease 04/20/2011  . GERD (gastroesophageal reflux disease)   . CAD (coronary artery disease)     Nonobstructive. Cardiac cath in 2001-50% mid RI, normal LM, LAD, RCA  . Shortness of breath     "can come on at any time" (03/14/2013)  . Sleep apnea     "dx'd; couldn't  wear the mask" (03/14/2013)  . Diabetes mellitus, type 2   . Arthritis     "knees and lower back" (03/14/2013)     Home Meds:  Outpatient Prescriptions Prior to Visit  Medication Sig Dispense Refill  . ADVAIR DISKUS 250-50 MCG/DOSE AEPB TAKE 1 INHALATION BY MOUTH TWICE DAILY INTO LUNGS 60 each 4  . Blood Glucose Monitoring Suppl (TRUETRACK BLOOD GLUCOSE) W/DEVICE KIT     . celecoxib (CELEBREX) 200 MG capsule TAKE 1 CAPSULE BY MOUTH EVERY DAY 30 capsule 5  . clopidogrel (PLAVIX) 75 MG tablet TAKE 1 TABLET BY MOUTH EVERY DAY WITH BREAKFAST 30 tablet 4  . CRESTOR 20 MG tablet TAKE 1 TABLET BY MOUTH EVERY DAY BEFORE BEDTIME 30 tablet 4  . fish oil-omega-3 fatty acids 1000 MG capsule Take 1 g by mouth daily.      . furosemide (LASIX) 20 MG tablet TAKE 1 TABLET BY MOUTH EVERY DAY 30 tablet 5  . isosorbide mononitrate (IMDUR) 30 MG 24 hr tablet TAKE 1 TABLET BY MOUTH EVERY DAY 30 tablet 4  . JANUMET 50-1000 MG per tablet TAKE 1 TABLET BY MOUTH TWICE DAILY 60 tablet 4  . lisinopril (PRINIVIL,ZESTRIL) 20 MG tablet TAKE 1 TABLET BY MOUTH EVERY DAY 30 tablet 5  . metoprolol succinate (TOPROL-XL) 50 MG 24 hr tablet Take 150 mg 2 times daily 60 tablet 4  . nitroGLYCERIN (NITROSTAT) 0.4 MG SL tablet Place 0.4 mg under the tongue every 5 (five) minutes as needed for chest pain.    . pantoprazole (PROTONIX) 40 MG tablet TAKE 1 TABLET BY MOUTH EVERY DAY 30 tablet 11  . TRUETRACK TEST test strip     . warfarin (COUMADIN) 5 MG tablet TAKE 1 TABLET (5MG)BY MOUTH ON MON,WED,FRI AND TAKE 1/2 TABLET (2.5MG) BY MOUTH ON SUN, TUE, THUR, AND SAT 21 tablet 0   No facility-administered medications prior to visit.     Allergies: No Known Allergies  History   Social History  . Marital Status: Legally Separated    Spouse Name: N/A    Number of Children: 1  . Years of Education: N/A   Occupational History  . Unemployed   .     Social History Main Topics  . Smoking status: Former Smoker -- 1.00 packs/day  for 42 years    Types: Cigarettes    Quit date: 12/31/2011  . Smokeless tobacco: Never Used  . Alcohol Use: Yes     Comment: 03/14/2013 "stopped drinking back in 2002; never had problem w/it"  . Drug Use: No  . Sexual Activity: Not Currently   Other Topics Concern  . 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          Family History  Problem Relation Age of Onset  . Alzheimer's disease Mother   . Osteoporosis Mother      Review of Systems:  See HPI for pertinent ROS. All other ROS negative.    Physical Exam: Blood pressure 154/86, pulse 80, temperature 97.6 F (36.4 C), temperature source Oral, resp. rate 18, weight 233 lb (105.688 kg)., Body mass index is 31.59 kg/(m^2). General: WNWD WM. Appears in no acute distress. Neck: Supple. No thyromegaly. No lymphadenopathy. No carotid bruits. Lungs: Clear bilaterally to auscultation without wheezes, rales, or rhonchi. Breathing is unlabored. Heart: Irreg rhythm Abdomen: Soft, non-tender, non-distended with normoactive bowel sounds. No hepatomegaly. No rebound/guarding. No obvious abdominal masses. Musculoskeletal:  Strength and tone normal for age. Extremities/Skin: Warm and dry. No clubbing or cyanosis. No edema. No rashes or suspicious lesions. Neuro: Alert and oriented X 3. Moves all extremities spontaneously. Gait is normal. CNII-XII grossly in tact. Psych:  Responds to questions appropriately with a normal affect. Diabetic foot exam: Inspection is normal.  Sensation is intact and normal.  He has 2+ posterior tibial pulses bilaterally. Trace dorsalis pedis pulses bilaterally.    Results for orders placed or performed in visit on 03/09/14  PT with INR/Fingerstick  Result Value Ref Range   PT, fingerstick 25.3 (H) 10.4 - 12.5 seconds   INR, fingerstick 2.1  (H) 0.80 - 1.20     ASSESSMENT AND PLAN:  63 y.o. year old male with    4. Depression I wrote down for him to start with taking 1 daily for 5 days then increase to 1 BID.  Also told him this as well as wrote it down. I reminded him that it may take a while for the medication to take effect. Told him to call me if he thinks he is having any adverse effects to the medication. Otherwise even if he is seeing no effect,  just keep taking it until his follow-up appointment with me.  - buPROPion (WELLBUTRIN SR) 150 MG 12 hr tablet; Take 1 tablet (150 mg total) by mouth 2 (two) times daily.  Dispense: 60 tablet; Refill: 2   1. Coronary Artery Disease He underwent cardiac catheterization 03/17/2013. Was not favorable for PCI. He has had medical treatment of CAD.   2 Long term (current) use of anticoagulants - PT with INR/Fingerstick Continue current dosing of 2.5 mg every day except for 5 mg on Mondays Wednesdays Fridays Recheck PT/INR in one month.  3. Chronic anticoagulation 4. Atrial fibrillation----------see HPI--He recently saw Cardiology and has f/u at Dr.  Tanna Furry office. 5. Atrial flutter 6. Pacemaker  7. Diabetes mellitus, type 2  - Microalbumin, urine---done 06/26/2013 Check A1C today  He states that he checks his blood sugar about 3 times per week. Checks at different times of day. Never gets < 80. He is on ACE inhibitor. He is on statin therapy. NOT on ASA----He is on Plavix and Coumadin per Cardiology  Kidney function is normal. Foot exam is stable. Patient states that he had an eye exam about one year ago. Is due to schedule followup and patient plans to schedule this. He was told that exam showed "no diabetic damage "  He received Pneumovax 23 here 01/05/2011 No further pneumonia vaccine indicated until age 81  8. Hypertension At goal. On ACE  inhibitor. BMET normal. Pressure is elevated but I do not want to start adjusting these medications as he is seeing  cardiology and EP and they are adjusting medications. We'll leave this to them to follow-up.  9. Hyperlipidemia At lab 06/19/13 checked FLP and LFTs. LFTs normal. Lipid Panel is excellent. Cont Crestor 20 mg.  10. Chronic obstructive pulmonary disease Quit smoking 2013.  11. GASTROESOPHAGEAL REFLUX DISEASE Protonix can be used with Plavix to continue this one.  12. Prostate cancer screening - PSA, Medicare  13. Screening for colorectal cancer The following is copied form his OV on 06/26/2013:   "He reports that he has never had a colonoscopy. Says in the past he was going to -but that's when he then lost his insurance. He is agreeable to proceed with this and is agreeable for me to do her referral for followup with GI. - Ambulatory referral to Gastroenterology "  At f/u OV 11/06/2013 he reports: He cannot even afford the co-pay to see GI for the office visit. His co-pay for that visit was going to be $45. He does not even know what his cost would be for the colonoscopy itself.  14. Immunizations: He received Pneumovax 23 here in the office on 01/05/2011 No further pneumonia vaccine indicated until age 60. He received tetanus here at our office 01/05/2011 Influenza Vaccine--Agreeable to receive here today  Plan for him to return here for PT-INR check in 4 weeks. Return for routine followup office visit in 3 months.      Signed, [address] [place], [place], [facility] 03/09/2014 9:09 AM

## 2014-03-18 ENCOUNTER — Other Ambulatory Visit: Payer: Self-pay | Admitting: Physician Assistant

## 2014-03-19 NOTE — Telephone Encounter (Signed)
Medication refilled per protocol. 

## 2014-03-20 ENCOUNTER — Encounter: Payer: Self-pay | Admitting: Cardiology

## 2014-03-26 ENCOUNTER — Encounter (HOSPITAL_COMMUNITY): Payer: Self-pay | Admitting: Internal Medicine

## 2014-03-31 NOTE — Addendum Note (Signed)
Encounter addended by: Cathie Olden, RN on: 03/31/2014  2:05 PM<BR>     Documentation filed: Notes Section

## 2014-03-31 NOTE — Progress Notes (Signed)
Patient is discharged from [name] today, March 11,2015 with 6 sessions. Patient stopped the program.

## 2014-04-13 ENCOUNTER — Encounter: Payer: Self-pay | Admitting: Physician Assistant

## 2014-04-13 ENCOUNTER — Ambulatory Visit (INDEPENDENT_AMBULATORY_CARE_PROVIDER_SITE_OTHER): Payer: Medicare HMO | Admitting: Physician Assistant

## 2014-04-13 VITALS — BP 142/98 | HR 88 | Temp 97.5°F | Resp 18 | Wt 228.0 lb

## 2014-04-13 DIAGNOSIS — E1165 Type 2 diabetes mellitus with hyperglycemia: Secondary | ICD-10-CM

## 2014-04-13 DIAGNOSIS — I481 Persistent atrial fibrillation: Secondary | ICD-10-CM

## 2014-04-13 DIAGNOSIS — R197 Diarrhea, unspecified: Secondary | ICD-10-CM | POA: Insufficient documentation

## 2014-04-13 DIAGNOSIS — I4819 Other persistent atrial fibrillation: Secondary | ICD-10-CM

## 2014-04-13 DIAGNOSIS — Z7901 Long term (current) use of anticoagulants: Secondary | ICD-10-CM

## 2014-04-13 LAB — PT WITH INR/FINGERSTICK
INR, fingerstick: 2 — ABNORMAL HIGH (ref 0.80–1.20)
PT, fingerstick: 23.6 seconds — ABNORMAL HIGH (ref 10.4–12.5)

## 2014-04-13 MED ORDER — METFORMIN HCL 500 MG PO TABS: 500.0000 mg | ORAL_TABLET | Freq: Two times a day (BID) | ORAL | Status: DC

## 2014-04-13 MED ORDER — SITAGLIPTIN PHOSPHATE 100 MG PO TABS: 100.0000 mg | ORAL_TABLET | Freq: Every day | ORAL | Status: DC

## 2014-04-13 NOTE — Progress Notes (Signed)
Patient ID: Tyjuan Demetro MRN: [id number], DOB: [birth year], 63 y.o. Date of Encounter: '@DATE' @  Chief Complaint:  Chief Complaint  Patient presents with  . PT/INR check    5 mg M-W-F  2.5 mg S-T-T-S    HPI: 63 y.o. year old white male  presents for followup office visit. Here to check his PT/INR.   He had his first office visit with me on 06/19/2013.  He had been seen in our office in the past by [name]. First visit with him was 01/05/2011. He only saw him one other visit and that  was 04/19/2011.  Since then, patient has been seeing [facility].  [facility]  He had been having his PT INR checked at [facility].  However he wanted to start having it checked at our office.  He presented here on 06/19/13 for PT/INR check and also to re- establish primary care here.  He is on Coumadin for history of atrial fibrillation/atrial flutter. Goal INR is 2-3. He is taking 2.5 mg on all days except for 5 mg on Mondays /  Wednesdays /  Fridays. No skipped doses.  No bleeding.  No significant change in dietary intake of Vitamin K containing foods.  No change in medications.  He is taking his diabetic medications as directed. He checks his blood sugar about 3 times per week at different times of day and always gets good readings but never gets low readings--never anything lower than 80.  He is taking blood pressure medications as directed. No lightheadedness. No lower extremity edema.  He is taking his Crestor as directed. No myalgias or other adverse effects.  At his office visit with me around 12/2013 he reported that he had been having episodes of heart racing and feeling short of breath. Michela Pitcher that this is even woken him up from sleep at night sometimes. Today I did review Epic and he has seen cardiology since then and they did increase his dose of Toprol. He does have a follow-up appointment scheduled to see Dr.  Crissie Sickles.  At [place] 03/09/2014 he says that he did want to discuss one thing. Says that he has been on medications for depression in the past but then he was without insurance for a while so he had gone off of those medications. He is feeling that he does need to get back on medication for this.  Says that he does not actually have episodes of crying but says that he does feel depressed and feels like he is not really interested in doing things that he used to find pleasure in. He says that there are no unusual stressors going on in his life right now says that things are same as usual but he just doesn't feel right. Feels depressed as above. Also says that it seems to be affecting his sleep and that he will sometimes wake up in the night thinking of things. Says that [name] had him on a medication that he prescribed here but says that he doesn't think that worked very well but cannot remember the name of it. I reviewed his chart and it looks like that was Lexapro. Patient says that he also knows in the past he has been on Wellbutrin at one point and was on Prozac at one point. He says that he remembers that the Wellbutrin worked well for him. At that OV I Rxed Wellbutrin. AT [facility] 04/13/14-- Pt  says he is taking this as directed---now taking BID. He cant tell whether it ishelping much yet or not.   At [place] 04/13/14--he says he has been having diarrhea past 3 months. Says it is getting worse--now it happens every time he eats. No other abdominal pain of GI complaints--just diarrhea.   No other complaints.  Past Medical History  Diagnosis Date  . Persistent atrial fibrillation     recurrent atrial flutter since 2001 s/p DCCVs, multiple failed AADs, h/o tachy-mediated cardiomyopathy  . Hypertension     with hypertensive heart disease  . Hyperlipidemia   . Atrial flutter     radiofrequency ablation in 2001  . Obesity   . Chronic anticoagulation     chronic Coumadin anticoagulation  . Tobacco  abuse   . Chronic obstructive pulmonary disease 04/20/2011  . GERD (gastroesophageal reflux disease)   . CAD (coronary artery disease)     Nonobstructive. Cardiac cath in 2001-50% mid RI, normal LM, LAD, RCA  . Shortness of breath     "can come on at any time" (03/14/2013)  . Sleep apnea     "dx'd; couldn't wear the mask" (03/14/2013)  . Diabetes mellitus, type 2   . Arthritis     "knees and lower back" (03/14/2013)     Home Meds:  Outpatient Prescriptions Prior to Visit  Medication Sig Dispense Refill  . ADVAIR DISKUS 250-50 MCG/DOSE AEPB TAKE 1 INHALATION BY MOUTH TWICE DAILY INTO LUNGS 60 each 4  . Blood Glucose Monitoring Suppl (TRUETRACK BLOOD GLUCOSE) W/DEVICE KIT     . buPROPion (WELLBUTRIN SR) 150 MG 12 hr tablet Take 1 tablet (150 mg total) by mouth 2 (two) times daily. 60 tablet 2  . celecoxib (CELEBREX) 200 MG capsule TAKE 1 CAPSULE BY MOUTH EVERY DAY 30 capsule 5  . clopidogrel (PLAVIX) 75 MG tablet TAKE 1 TABLET BY MOUTH EVERY DAY WITH BREAKFAST 30 tablet 4  . CRESTOR 20 MG tablet TAKE 1 TABLET BY MOUTH EVERY DAY BEFORE BEDTIME 30 tablet 4  . fish oil-omega-3 fatty acids 1000 MG capsule Take 1 g by mouth daily.      . furosemide (LASIX) 20 MG tablet TAKE 1 TABLET BY MOUTH EVERY DAY 30 tablet 5  . isosorbide mononitrate (IMDUR) 30 MG 24 hr tablet TAKE 1 TABLET BY MOUTH EVERY DAY 30 tablet 4  . lisinopril (PRINIVIL,ZESTRIL) 20 MG tablet TAKE 1 TABLET BY MOUTH EVERY DAY 30 tablet 5  . metoprolol succinate (TOPROL-XL) 50 MG 24 hr tablet Take 150 mg 2 times daily 60 tablet 4  . nitroGLYCERIN (NITROSTAT) 0.4 MG SL tablet Place 0.4 mg under the tongue every 5 (five) minutes as needed for chest pain.    . pantoprazole (PROTONIX) 40 MG tablet TAKE 1 TABLET BY MOUTH EVERY DAY 30 tablet 11  . TRUETRACK TEST test strip     . warfarin (COUMADIN) 5 MG tablet TAKE 1 TABLET (5MG)BY MOUTH ON MON,WED,FRI AND TAKE 1/2 TABLET (2.5MG) BY MOUTH ON SUN, TUE, THUR, AND SAT 21 tablet 2  . JANUMET  50-1000 MG per tablet TAKE 1 TABLET BY MOUTH TWICE DAILY 60 tablet 4   No facility-administered medications prior to visit.     Allergies: No Known Allergies  History   Social History  . Marital Status: Legally Separated    Spouse Name: N/A    Number of Children: 1  . Years of Education: N/A   Occupational History  . Unemployed   .     Social  History Main Topics  . Smoking status: Former Smoker -- 1.00 packs/day for 42 years    Types: Cigarettes    Quit date: 12/31/2011  . Smokeless tobacco: Never Used  . Alcohol Use: Yes     Comment: 03/14/2013 "stopped drinking back in 2002; never had problem w/it"  . Drug Use: No  . Sexual Activity: Not Currently   Other Topics Concern  . 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          Family History  Problem Relation Age of Onset  . Alzheimer's disease Mother   . Osteoporosis Mother      Review of Systems:  See HPI for pertinent ROS. All other ROS negative.    Physical Exam: Blood pressure 142/98, pulse 88, temperature 97.5 F (36.4 C), temperature source Oral, resp. rate 18, weight 228 lb (103.42 kg)., Body mass index is 30.92 kg/(m^2). General: WNWD WM. Appears in no acute distress. Neck: Supple. No thyromegaly. No lymphadenopathy. No carotid bruits. Lungs: Clear bilaterally to auscultation without wheezes, rales, or rhonchi. Breathing is unlabored. Heart: Irreg rhythm Abdomen: Soft, non-tender, non-distended with normoactive bowel sounds. No hepatomegaly. No rebound/guarding. No obvious abdominal masses. Musculoskeletal:  Strength and tone normal for age. Extremities/Skin: Warm and dry. No clubbing or cyanosis. No edema. No rashes or suspicious lesions. Neuro: Alert and oriented X 3. Moves all extremities spontaneously. Gait is normal. CNII-XII  grossly in tact. Psych:  Responds to questions appropriately with a normal affect. Diabetic foot exam: Inspection is normal.  Sensation is intact and normal.  He has 2+ posterior tibial pulses bilaterally. Trace dorsalis pedis pulses bilaterally.    Results for orders placed or performed in visit on 04/13/14  PT with INR/Fingerstick  Result Value Ref Range   PT, fingerstick 23.6 (H) 10.4 - 12.5 seconds   INR, fingerstick 2.0 (H) 0.80 - 1.20     ASSESSMENT AND PLAN:  63 y.o. year old male with   THE TOP 3 ITEMS--BOLDED--ARE WHAT I DID AT OV 04/13/2014:    Depression Cont Wellbutrin at current dose--give it a little longer to see if starts seeing more effect --it has just been 4 weeks since started med-- If worsens at all, call me--o/w will f/u at next OV - buPROPion (WELLBUTRIN SR) 150 MG 12 hr tablet; Take 1 tablet (150 mg total) by mouth 2 (two) times daily.  Dispense: 60 tablet; Refill: 2  Diarrhea Decrease Metformin --If does not improve, will completely D/C metformin and check labs and stool cultures.  CBC, cMET nml 02/05/14   Long term (current) use of anticoagulants - PT with INR/Fingerstick Continue current dosing of 2.5 mg every day except for 5 mg on Mondays Wednesdays Fridays Recheck PT/INR in one month.      Coronary Artery Disease He underwent cardiac catheterization 03/17/2013. Was not favorable for PCI. He has had medical treatment of CAD.    3. Chronic anticoagulation 4. Atrial fibrillation----------see HPI--He recently saw Cardiology and has f/u at Dr.  Tanna Furry office. 5. Atrial flutter 6. Pacemaker  7. Diabetes mellitus, type 2  - Microalbumin, urine---done 06/26/2013 Check A1C today  He states that he checks his blood sugar about 3 times per week. Checks at different times of day. Never gets < 80. He is on ACE inhibitor. He is  on statin therapy. NOT on ASA----He is on Plavix and Coumadin per Cardiology  Kidney function is normal. Foot exam is  stable. Patient states that he had an eye exam about one year ago. Is due to schedule followup and patient plans to schedule this. He was told that exam showed "no diabetic damage "  He received Pneumovax 23 here 01/05/2011 No further pneumonia vaccine indicated until age 77  8. Hypertension At goal. On ACE inhibitor. BMET normal. Pressure is elevated but I do not want to start adjusting these medications as he is seeing cardiology and EP and they are adjusting medications. We'll leave this to them to follow-up.  9. Hyperlipidemia At lab 06/19/13 checked FLP and LFTs. LFTs normal. Lipid Panel is excellent. Cont Crestor 20 mg.  10. Chronic obstructive pulmonary disease Quit smoking 2013.  11. GASTROESOPHAGEAL REFLUX DISEASE Protonix can be used with Plavix to continue this one.  12. Prostate cancer screening - PSA, Medicare  13. Screening for colorectal cancer The following is copied form his OV on 06/26/2013:   "He reports that he has never had a colonoscopy. Says in the past he was going to -but that's when he then lost his insurance. He is agreeable to proceed with this and is agreeable for me to do her referral for followup with GI. - Ambulatory referral to Gastroenterology "  At f/u OV 11/06/2013 he reports: He cannot even afford the co-pay to see GI for the office visit. His co-pay for that visit was going to be $45. He does not even know what his cost would be for the colonoscopy itself.  14. Immunizations: He received Pneumovax 23 here in the office on 01/05/2011 No further pneumonia vaccine indicated until age 35. He received tetanus here at our office 01/05/2011 Influenza Vaccine--Agreeable to receive here today  Plan for him to return here for PT-INR check in 4 weeks. Return for routine followup office visit in 3 months.      [address] [place], [place], [facility] 04/13/2014 9:19 AM

## 2014-05-11 ENCOUNTER — Encounter: Payer: Medicare HMO | Admitting: Internal Medicine

## 2014-05-12 ENCOUNTER — Other Ambulatory Visit: Payer: Self-pay | Admitting: Family Medicine

## 2014-05-12 MED ORDER — NITROGLYCERIN 0.4 MG SL SUBL: 0.4000 mg | SUBLINGUAL_TABLET | SUBLINGUAL | Status: DC | PRN

## 2014-05-12 NOTE — Telephone Encounter (Signed)
Medication refilled per protocol. 

## 2014-05-14 ENCOUNTER — Ambulatory Visit: Payer: Medicare HMO | Admitting: Physician Assistant

## 2014-05-19 ENCOUNTER — Other Ambulatory Visit: Payer: Self-pay | Admitting: Family Medicine

## 2014-05-20 ENCOUNTER — Ambulatory Visit: Payer: Medicare HMO | Admitting: Physician Assistant

## 2014-06-15 ENCOUNTER — Other Ambulatory Visit: Payer: Self-pay | Admitting: Physician Assistant

## 2014-06-15 ENCOUNTER — Other Ambulatory Visit: Payer: Self-pay | Admitting: Adult Health

## 2014-06-16 NOTE — Telephone Encounter (Signed)
Medication refilled per protocol. 

## 2014-06-17 ENCOUNTER — Telehealth: Payer: Self-pay | Admitting: Family Medicine

## 2014-06-17 MED ORDER — NITROGLYCERIN 0.4 MG SL SUBL: 0.4000 mg | SUBLINGUAL_TABLET | SUBLINGUAL | Status: DC | PRN

## 2014-06-17 NOTE — Telephone Encounter (Signed)
Medication refilled per protocol. 

## 2014-06-22 ENCOUNTER — Encounter: Payer: Self-pay | Admitting: Physician Assistant

## 2014-06-22 ENCOUNTER — Ambulatory Visit (INDEPENDENT_AMBULATORY_CARE_PROVIDER_SITE_OTHER): Payer: Commercial Managed Care - HMO | Admitting: Physician Assistant

## 2014-06-22 VITALS — BP 142/88 | HR 72 | Temp 97.6°F | Resp 18 | Wt 226.0 lb

## 2014-06-22 DIAGNOSIS — E1165 Type 2 diabetes mellitus with hyperglycemia: Secondary | ICD-10-CM | POA: Diagnosis not present

## 2014-06-22 DIAGNOSIS — Z125 Encounter for screening for malignant neoplasm of prostate: Secondary | ICD-10-CM | POA: Diagnosis not present

## 2014-06-22 DIAGNOSIS — R7989 Other specified abnormal findings of blood chemistry: Secondary | ICD-10-CM | POA: Diagnosis not present

## 2014-06-22 DIAGNOSIS — E119 Type 2 diabetes mellitus without complications: Secondary | ICD-10-CM | POA: Diagnosis not present

## 2014-06-22 DIAGNOSIS — Z7901 Long term (current) use of anticoagulants: Secondary | ICD-10-CM | POA: Diagnosis not present

## 2014-06-22 DIAGNOSIS — N182 Chronic kidney disease, stage 2 (mild): Secondary | ICD-10-CM | POA: Diagnosis not present

## 2014-06-22 DIAGNOSIS — I4819 Other persistent atrial fibrillation: Secondary | ICD-10-CM

## 2014-06-22 DIAGNOSIS — Z720 Tobacco use: Secondary | ICD-10-CM | POA: Diagnosis not present

## 2014-06-22 DIAGNOSIS — R972 Elevated prostate specific antigen [PSA]: Secondary | ICD-10-CM

## 2014-06-22 DIAGNOSIS — I10 Essential (primary) hypertension: Secondary | ICD-10-CM

## 2014-06-22 DIAGNOSIS — E785 Hyperlipidemia, unspecified: Secondary | ICD-10-CM | POA: Diagnosis not present

## 2014-06-22 DIAGNOSIS — I481 Persistent atrial fibrillation: Secondary | ICD-10-CM

## 2014-06-22 DIAGNOSIS — I4892 Unspecified atrial flutter: Secondary | ICD-10-CM | POA: Diagnosis not present

## 2014-06-22 DIAGNOSIS — R945 Abnormal results of liver function studies: Secondary | ICD-10-CM

## 2014-06-22 DIAGNOSIS — J439 Emphysema, unspecified: Secondary | ICD-10-CM | POA: Diagnosis not present

## 2014-06-22 LAB — PT WITH INR/FINGERSTICK
INR, fingerstick: 1 (ref 0.80–1.20)
PT, fingerstick: 11.9 seconds (ref 10.4–12.5)

## 2014-06-22 LAB — HEMOGLOBIN A1C
Hgb A1c MFr Bld: 6.2 % — ABNORMAL HIGH (ref <5.7)
Mean Plasma Glucose: 131 mg/dL — ABNORMAL HIGH (ref <117)

## 2014-06-22 NOTE — Progress Notes (Addendum)
D   Patient ID: [name] Cook MRN: [id number], DOB: 16-Feb-1951, 64 y.o. Date of Encounter: '@DATE' @  Chief Complaint:  Chief Complaint  Patient presents with  . 3 mth check    is fasting  . PT/INR    last one 12/28  has missed appts    HPI: 64 y.o. year old white male  presents for followup office visit. Here to check his PT/INR.   He had his first office visit with me on 06/19/2013.  He had been seen in our office in the past by [name]. First visit with him was 01/05/2011. He only saw him one other visit and that  was 04/19/2011.  Since then, patient has been seeing [facility].  [facility].  He had been having his PT INR checked at [facility].  However he wanted to start having it checked at our office.  He presented here on 06/19/13 for PT/INR check and also to re- establish primary care here.  He is on Coumadin for history of atrial fibrillation/atrial flutter. Goal INR is 2-3. He is taking 2.5 mg on all days except for 5 mg on Mondays /  Wednesdays /  Fridays. No skipped doses.  No bleeding.  No significant change in dietary intake of Vitamin K containing foods.  No change in medications.  He is taking his diabetic medications as directed. He checks his blood sugar about 3 times per week at different times of day and always gets good readings but never gets low readings--never anything lower than 80.  He is taking blood pressure medications as directed. No lightheadedness. No lower extremity edema.  He is taking his Crestor as directed. No myalgias or other adverse effects.  At his office visit with me around 12/2013 he reported that he had been having episodes of heart racing and feeling short of breath. Michela Pitcher that this is even woken him up from sleep at night sometimes.We called Cardiology and reported symptoms and scheduled f/u OV with Cardiology.  At f/u OV 03/09/14  I did review Epic and he has  seen cardiology since then and they did increase his dose of Toprol. He does have a follow-up appointment scheduled to see Dr. Crissie Sickles.  At [place] 03/09/2014 he says that he did want to discuss one thing. Says that he has been on medications for depression in the past but then he was without insurance for a while so he had gone off of those medications. He is feeling that he does need to get back on medication for this.  Says that he does not actually have episodes of crying but says that he does feel depressed and feels like he is not really interested in doing things that he used to find pleasure in. He says that there are no unusual stressors going on in his life right now says that things are same as usual but he just doesn't feel right. Feels depressed as above. Also says that it seems to be affecting his sleep and that he will sometimes wake up in the night thinking of things. Says that [name] had him on a medication that he prescribed here but says that he doesn't think that worked very well but cannot remember the name of it. I reviewed his chart and it looks like that was Lexapro. Patient says that he also knows in the past he has been on Wellbutrin at one point and was  on Prozac at one point. He says that he remembers that the Wellbutrin worked well for him. At that OV I Rxed Wellbutrin. AT OV 04/13/14-- Pt says he is taking this as directed---now taking BID. He cant tell whether it ishelping much yet or not.   At [place] 04/13/14--he said he was having diarrhea for months. No abdominal pain--just diarrhea.  IDecreased Metformin--At OV 06/22/14 pt says diarrhea resolved since decreasing the dose of Metformin.  At office visit 06/22/14 his INR is subtherapeutic at 1.0. Prior to this, all of his INR checks here have been therapeutic and he has always been very compliant with taking his Coumadin and coming in for INR checks as directed. Has been on current dose of 2.5 all days except for 5 mg on  Monday Wednesday Friday for a long time. Today when I confronted him about why his INR is so low-- at first he says he may have skipped a couple of doses.  I discussed that an INR of 1.0 is the same as someone who is not on Coumadin at all.  After further discussion, he says that he has been out-of-town at his girlfriend's parents in [place], [place] and says that it has been "chaos there "says that they have sitters day and night with her parents. Says he has been taking his morning pills every day because he remembers those. However, regarding the medicines that he usually takes later in the day--those are the ones that he has often been forgetting to take.  Today I reviewed with him the importance of Coumadin and that this is one of the very most important medicines he is on. Reminded him of his A. fib and fact that he can form clot in his heart which can then go to his brain and cause major stroke. Also he says that he is leaving to go back up to [place] in 1 week--on this upcoming Monday-- and will not be coming back until the March 28.  Also reviewed when his prior labs been done and which labs are due today. Reviewed that the last PSA was elevated and that I referred to urology but he had deferred secondary to cost. Again today he still says that he cannot afford to pay the co-pay to go to urology. I explained to him that there can be multiple causes for elevated PSA but one of those causes is prostate cancer and the necessity for follow-up.  No other complaints.  Past Medical History  Diagnosis Date  . Persistent atrial fibrillation     recurrent atrial flutter since 2001 s/p DCCVs, multiple failed AADs, h/o tachy-mediated cardiomyopathy  . Hypertension     with hypertensive heart disease  . Hyperlipidemia   . Atrial flutter     radiofrequency ablation in 2001  . Obesity   . Chronic anticoagulation     chronic Coumadin anticoagulation  . Tobacco abuse   . Chronic obstructive  pulmonary disease 04/20/2011  . GERD (gastroesophageal reflux disease)   . CAD (coronary artery disease)     Nonobstructive. Cardiac cath in 2001-50% mid RI, normal LM, LAD, RCA  . Shortness of breath     "can come on at any time" (03/14/2013)  . Sleep apnea     "dx'd; couldn't wear the mask" (03/14/2013)  . Diabetes mellitus, type 2   . Arthritis     "knees and lower back" (03/14/2013)     Home Meds:  Outpatient Prescriptions Prior to Visit  Medication Sig Dispense Refill  .  ADVAIR DISKUS 250-50 MCG/DOSE AEPB TAKE 1 INHALATION BY MOUTH TWICE DAILY INTO LUNGS 60 each 4  . Blood Glucose Monitoring Suppl (TRUETRACK BLOOD GLUCOSE) W/DEVICE KIT     . buPROPion (WELLBUTRIN SR) 150 MG 12 hr tablet Take 1 tablet (150 mg total) by mouth 2 (two) times daily. 60 tablet 2  . celecoxib (CELEBREX) 200 MG capsule TAKE 1 CAPSULE BY MOUTH EVERY DAY 30 capsule 5  . clopidogrel (PLAVIX) 75 MG tablet TAKE 1 TABLET BY MOUTH EVERY DAY WITH BREAKFAST 30 tablet 0  . CRESTOR 20 MG tablet TAKE 1 TABLET BY MOUTH EVERY DAY BEFORE BEDTIME 30 tablet 4  . fish oil-omega-3 fatty acids 1000 MG capsule Take 1 g by mouth daily.      . furosemide (LASIX) 20 MG tablet TAKE 1 TABLET BY MOUTH EVERY DAY 30 tablet 5  . isosorbide mononitrate (IMDUR) 30 MG 24 hr tablet TAKE 1 TABLET BY MOUTH EVERY DAY 30 tablet 3  . lisinopril (PRINIVIL,ZESTRIL) 20 MG tablet TAKE 1 TABLET BY MOUTH EVERY DAY 30 tablet 5  . metFORMIN (GLUCOPHAGE) 500 MG tablet Take 1 tablet (500 mg total) by mouth 2 (two) times daily with a meal. 60 tablet 3  . metoprolol succinate (TOPROL-XL) 50 MG 24 hr tablet TAKE 3 TABLETS (150MG) BY MOUTH TWICE DAILY 180 tablet 3  . nitroGLYCERIN (NITROSTAT) 0.4 MG SL tablet Place 1 tablet (0.4 mg total) under the tongue every 5 (five) minutes as needed for chest pain. 25 tablet 1  . pantoprazole (PROTONIX) 40 MG tablet TAKE 1 TABLET BY MOUTH EVERY DAY 30 tablet 11  . sitaGLIPtin (JANUVIA) 100 MG tablet Take 1 tablet (100 mg  total) by mouth daily. 30 tablet 3  . TRUETRACK TEST test strip     . warfarin (COUMADIN) 5 MG tablet TAKE 1 TABLET (5MG) BY MOUTH ON MONDAY, WEDNESDAY,FRIDAY AND TAKE 1/2 TABLET (2.5MG) BY MOUTH ON SUNDAY, TUESDAY, THURSDAY, AND SATURDAY 21 tablet 0   No facility-administered medications prior to visit.     Allergies: No Known Allergies  History   Social History  . Marital Status: Legally Separated    Spouse Name: N/A  . Number of Children: 1  . Years of Education: N/A   Occupational History  . Unemployed   .     Social History Main Topics  . Smoking status: Former Smoker -- 1.00 packs/day for 42 years    Types: Cigarettes    Quit date: 12/31/2011  . Smokeless tobacco: Never Used  . Alcohol Use: Yes     Comment: 03/14/2013 "stopped drinking back in 2002; never had problem w/it"  . Drug Use: No  . Sexual Activity: Not Currently   Other Topics Concern  . 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          Family History  Problem Relation Age of Onset  . Alzheimer's disease Mother   . Osteoporosis Mother      Review of Systems:  See HPI for pertinent ROS. All other ROS negative.    Physical Exam: Blood pressure 142/88, pulse 72, temperature 97.6 F (36.4 C), temperature source Oral, resp. rate 18, weight 226 lb (102.513 kg)., Body mass index is 30.64 kg/(m^2). General: WNWD WM. Appears in no acute distress. Neck: Supple. No thyromegaly. No lymphadenopathy.  No carotid bruits. Lungs: Clear bilaterally to auscultation without wheezes, rales, or rhonchi. Breathing is unlabored. Heart: Irreg rhythm Abdomen: Soft, non-tender, non-distended with normoactive bowel sounds. No hepatomegaly. No rebound/guarding. No obvious abdominal masses. Musculoskeletal:  Strength and tone normal for  age. Extremities/Skin: Warm and dry. No clubbing or cyanosis. No edema. No rashes or suspicious lesions. Neuro: Alert and oriented X 3. Moves all extremities spontaneously. Gait is normal. CNII-XII grossly in tact. Psych:  Responds to questions appropriately with a normal affect. Diabetic foot exam: Inspection is normal.  Sensation is intact and normal.  He has 2+ posterior tibial pulses bilaterally. Trace dorsalis pedis pulses bilaterally.    Results for orders placed or performed in visit on 06/22/14  PT with INR/Fingerstick  Result Value Ref Range   PT, fingerstick 11.9 10.4 - 12.5 seconds   INR, fingerstick 1.0 0.80 - 1.20     ASSESSMENT AND PLAN:  64 y.o. year old male with   FINANCIAL DISTRESS---SEE # 12 AND # 13 BELOW. ---CANNOT AFFORD COPAY TO SEE GI OR UROLOGY.                                       ----ALSO, SEE HPI---PRIOR TO COMING TO THIS OFFICE, WENT TO [facility]    -----WILL HAVE OUR STAFF SEE IF THN OR SOME OTHER ASSISTANCE CAN HELP HIM   1.Coronary Artery Disease He underwent cardiac catheterization 03/17/2013. Was not favorable for PCI. He has had medical treatment of CAD.    2. Chronic anticoagulation 3. Atrial fibrillation----------see HPI--He recently saw Cardiology and has f/u at Dr.  Tanna Furry office. 4. Atrial flutter 5. Pacemaker  6. Depression Stable, Controlled. Cont current meds  7. Diabetes mellitus, type 2  - Microalbumin, urine---done 06/26/2013 Check A1C today  He states that he checks his blood sugar about 3 times per week. Checks at different times of day. Never gets < 80. He is on ACE inhibitor. He is on statin therapy. NOT on ASA----He is on Plavix and Coumadin per Cardiology  Kidney function is normal. Foot exam is stable. Patient states that he had an eye exam about one year ago. Is due to schedule followup and patient plans to schedule this. He was told that exam showed "no diabetic damage "  He received  Pneumovax 23 here 01/05/2011 No further pneumonia vaccine indicated until age 89  8. Hypertension At goal. On ACE inhibitor. BMET normal. Pressure is elevated but I do not want to start adjusting these medications as he is seeing cardiology and EP and they are adjusting medications. We'll leave this to them to follow-up.  9. Hyperlipidemia At lab 06/19/13 checked FLP and LFTs. LFTs normal. Lipid Panel is excellent. Cont Crestor 20 mg.  10. Chronic obstructive pulmonary disease Quit smoking 2013.  11. GASTROESOPHAGEAL REFLUX DISEASE Protonix can be used with Plavix to continue this one.  12. Prostate cancer screening - PSA, Medicare----PSA was elevated 04/13/14. I ordered referral to urology. Patient reported that he could not afford the co-pay and did not follow-up with urology. We'll repeat PSA today. If PSA still elevated, we'll have our staff see if we can find any type of assistance program to help cover cost so that he can follow-up with urology.  26. Screening for colorectal cancer The following is copied form his OV on 06/26/2013:   "He reports that he has never had a colonoscopy.  Says in the past he was going to -but that's when he then lost his insurance. He is agreeable to proceed with this and is agreeable for me to do her referral for followup with GI. - Ambulatory referral to Gastroenterology "  At f/u OV 11/06/2013 he reports: He cannot even afford the co-pay to see GI for the office visit. His co-pay for that visit was going to be $45. He does not even know what his cost would be for the colonoscopy itself.  14. Immunizations: He received Pneumovax 23 here in the office on 01/05/2011 No further pneumonia vaccine indicated until age 24. He received tetanus here at our office 01/05/2011 Influenza Vaccine--Agreeable to receive here today--Given here 04/13/2014  Plan for him to return here for PT-INR check in 1 week. He is to take the following dose of Coumadin until he comes in  for recheck. He says that he will be leaving to go out of town and will not be here the upcoming Monday. Or for how to schedule his follow-up office visit here this upcoming Friday 06/26/14.  Monday------ 5 mg Tuesday-----5 mg Wednesday--- 2.5 mg Thursday------- 5 mg Friday to come to the office for INR check and further dosing recommendation.   I considered risk of clot, CVA but he will not be able to afford cost of Lovenox.  Hospitalization for Heparin not practical.  He is on Plavix, which should provide some benefit and reduce his risk of clot formation.  Also, given his new onset of non-compliance secondary to travel to [place], considered switch to new medication that does not require monitoring--such as Eliquis, etc. But he will not be able to afford.  He states he has Medicare secondary to Disability--cannot use savings cards with Medicare.   Return for office visit to recheck INR Friday 06/26/2014.      Signed, [address] [place], [place], [facility] 06/22/2014 9:40 AM

## 2014-06-23 LAB — PSA, MEDICARE: PSA: 4.71 ng/mL — ABNORMAL HIGH (ref <=4.00)

## 2014-06-24 ENCOUNTER — Encounter: Payer: Self-pay | Admitting: Physician Assistant

## 2014-06-26 ENCOUNTER — Ambulatory Visit (INDEPENDENT_AMBULATORY_CARE_PROVIDER_SITE_OTHER): Payer: Commercial Managed Care - HMO | Admitting: Family Medicine

## 2014-06-26 ENCOUNTER — Encounter: Payer: Self-pay | Admitting: Family Medicine

## 2014-06-26 VITALS — BP 98/68 | HR 84 | Temp 99.0°F | Resp 18 | Wt 213.0 lb

## 2014-06-26 DIAGNOSIS — I4891 Unspecified atrial fibrillation: Secondary | ICD-10-CM | POA: Diagnosis not present

## 2014-06-26 LAB — PT WITH INR/FINGERSTICK
INR, fingerstick: 1.9 — ABNORMAL HIGH (ref 0.80–1.20)
PT, fingerstick: 23.2 seconds — ABNORMAL HIGH (ref 10.4–12.5)

## 2014-06-26 NOTE — Progress Notes (Signed)
Subjective:    Patient ID: [name] Cook, male    DOB: 10/20/1950, 64 y.o.   MRN: [id number]  HPI Patient is here for follow-up of his office visit Monday. At that time he was felt to be subtherapeutic on his Coumadin. His INR was 1.1. The patient had been missing several doses of his Coumadin. He previously been managed with 5 mg on Monday Wednesday and Friday and 2.5 mg on all other days. My partner at that time loaded the patient with Coumadin. He received 5 mg on Monday  Wednesday and Thursday. He received 2.5 mg on Tuesday. His INR today is 1.9. This represents a rapid rise in his INR. Patient states that he was almost therapeutic on his previous dose of Coumadin when he was compliant. Past Medical History  Diagnosis Date  . Persistent atrial fibrillation     recurrent atrial flutter since 2001 s/p DCCVs, multiple failed AADs, h/o tachy-mediated cardiomyopathy  . Hypertension     with hypertensive heart disease  . Hyperlipidemia   . Atrial flutter     radiofrequency ablation in 2001  . Obesity   . Chronic anticoagulation     chronic Coumadin anticoagulation  . Tobacco abuse   . Chronic obstructive pulmonary disease 04/20/2011  . GERD (gastroesophageal reflux disease)   . CAD (coronary artery disease)     Nonobstructive. Cardiac cath in 2001-50% mid RI, normal LM, LAD, RCA  . Shortness of breath     "can come on at any time" (03/14/2013)  . Sleep apnea     "dx'd; couldn't wear the mask" (03/14/2013)  . Diabetes mellitus, type 2   . Arthritis     "knees and lower back" (03/14/2013)   Past Surgical History  Procedure Laterality Date  . Atrial flutter ablation  2002    atrial flutter; subsequently developed atrial fibrillation  . Loop recorder implant  2002  . Cardioversion  05/31/2011    Procedure: CARDIOVERSION;  Surgeon: Cristopher Estimable. Lattie Haw, MD;  Location: [facility];  Service: Cardiovascular;  Laterality: N/A;  . Av node ablation  01/24/2013  . Insert / replace / remove pacemaker   01/24/2013     Medtronic Adapta L dual-chamber pacemaker, serial number [id number]   . Carpal tunnel release Left 1980's  . Loop recorder explant  ~ 2003  . Cardiac catheterization  2002  . Permanent pacemaker insertion N/A 01/24/2013    Procedure: PERMANENT PACEMAKER INSERTION;  Surgeon: [name], MD;  Location: [facility] CATH LAB;  Service: Cardiovascular;  Laterality: N/A;  . Left heart catheterization with coronary angiogram N/A 03/17/2013    Procedure: LEFT HEART CATHETERIZATION WITH CORONARY ANGIOGRAM;  Surgeon: Burnell Blanks, MD;  Location: [facility] CATH LAB;  Service: Cardiovascular;  Laterality: N/A;   Current Outpatient Prescriptions on File Prior to Visit  Medication Sig Dispense Refill  . ADVAIR DISKUS 250-50 MCG/DOSE AEPB TAKE 1 INHALATION BY MOUTH TWICE DAILY INTO LUNGS 60 each 4  . Blood Glucose Monitoring Suppl (TRUETRACK BLOOD GLUCOSE) W/DEVICE KIT     . buPROPion (WELLBUTRIN SR) 150 MG 12 hr tablet Take 1 tablet (150 mg total) by mouth 2 (two) times daily. 60 tablet 2  . celecoxib (CELEBREX) 200 MG capsule TAKE 1 CAPSULE BY MOUTH EVERY DAY 30 capsule 5  . clopidogrel (PLAVIX) 75 MG tablet TAKE 1 TABLET BY MOUTH EVERY DAY WITH BREAKFAST 30 tablet 0  . CRESTOR 20 MG tablet TAKE 1 TABLET BY MOUTH EVERY DAY BEFORE BEDTIME 30 tablet 4  .  fish oil-omega-3 fatty acids 1000 MG capsule Take 1 g by mouth daily.      . furosemide (LASIX) 20 MG tablet TAKE 1 TABLET BY MOUTH EVERY DAY 30 tablet 5  . isosorbide mononitrate (IMDUR) 30 MG 24 hr tablet TAKE 1 TABLET BY MOUTH EVERY DAY 30 tablet 3  . lisinopril (PRINIVIL,ZESTRIL) 20 MG tablet TAKE 1 TABLET BY MOUTH EVERY DAY 30 tablet 5  . metFORMIN (GLUCOPHAGE) 500 MG tablet Take 1 tablet (500 mg total) by mouth 2 (two) times daily with a meal. 60 tablet 3  . metoprolol succinate (TOPROL-XL) 50 MG 24 hr tablet TAKE 3 TABLETS (150MG) BY MOUTH TWICE DAILY 180 tablet 3  . nitroGLYCERIN (NITROSTAT) 0.4 MG SL tablet Place 1 tablet (0.4 mg  total) under the tongue every 5 (five) minutes as needed for chest pain. 25 tablet 1  . pantoprazole (PROTONIX) 40 MG tablet TAKE 1 TABLET BY MOUTH EVERY DAY 30 tablet 11  . sitaGLIPtin (JANUVIA) 100 MG tablet Take 1 tablet (100 mg total) by mouth daily. 30 tablet 3  . TRUETRACK TEST test strip     . warfarin (COUMADIN) 5 MG tablet TAKE 1 TABLET (5MG) BY MOUTH ON MONDAY, WEDNESDAY,FRIDAY AND TAKE 1/2 TABLET (2.5MG) BY MOUTH ON SUNDAY, TUESDAY, THURSDAY, AND SATURDAY 21 tablet 0   No current facility-administered medications on file prior to visit.   No Known Allergies History   Social History  . Marital Status: Legally Separated    Spouse Name: N/A  . Number of Children: 1  . Years of Education: N/A   Occupational History  . Unemployed   .     Social History Main Topics  . Smoking status: Former Smoker -- 1.00 packs/day for 42 years    Types: Cigarettes    Quit date: 12/31/2011  . Smokeless tobacco: Never Used  . Alcohol Use: Yes     Comment: 03/14/2013 "stopped drinking back in 2002; never had problem w/it"  . Drug Use: No  . Sexual Activity: Not Currently   Other Topics Concern  . 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            Review of Systems  All other systems reviewed and are negative.      Objective:   Physical Exam  Constitutional: He appears well-developed and well-nourished. No distress.  HENT:  Right Ear: External ear normal.  Left Ear: External ear normal.  Nose: Nose normal.  Mouth/Throat: Oropharynx is clear and moist.  Eyes: Conjunctivae are normal. No scleral icterus.  Neck: Neck supple.  Cardiovascular: Normal rate and regular rhythm.   Pulmonary/Chest: Effort normal and breath sounds normal. No respiratory distress. He has no wheezes. He has no rales. He exhibits no  tenderness.  Abdominal: Soft. Bowel sounds are normal.  Lymphadenopathy:    He has no cervical adenopathy.  Skin: He is not diaphoretic.  Vitals reviewed.         Assessment & Plan:  Atrial fibrillation, unspecified - Plan: PT with INR/Fingerstick  Patients INR is 1.9. I believe we need to  Slow the rise in his INR to avoid the patient becoming supratherapeutic.Marland Kitchen Resume Coumadin 5 mg on Monday Wednesday Friday, 2.5 mg on Tuesday Thursday Saturday and Sunday. Recheck INR in 2 weeks.

## 2014-06-29 ENCOUNTER — Ambulatory Visit (INDEPENDENT_AMBULATORY_CARE_PROVIDER_SITE_OTHER): Payer: Commercial Managed Care - HMO | Admitting: Physician Assistant

## 2014-06-29 ENCOUNTER — Encounter: Payer: Self-pay | Admitting: Physician Assistant

## 2014-06-29 VITALS — BP 96/78 | HR 78 | Temp 97.5°F | Resp 18 | Wt 213.0 lb

## 2014-06-29 DIAGNOSIS — J988 Other specified respiratory disorders: Secondary | ICD-10-CM | POA: Diagnosis not present

## 2014-06-29 DIAGNOSIS — B9689 Other specified bacterial agents as the cause of diseases classified elsewhere: Principal | ICD-10-CM

## 2014-06-29 MED ORDER — AZITHROMYCIN 250 MG PO TABS: ORAL_TABLET | ORAL | Status: DC

## 2014-06-29 NOTE — Progress Notes (Signed)
Patient ID: [name] Cook MRN: [id number], DOB: May 28, 1950, 64 y.o. Date of Encounter: 06/29/2014, 4:22 PM    Chief Complaint:  Chief Complaint  Patient presents with  . SOB,cough, chest and head congestion,     HPI: 64 y.o. year old white male is here for office visit with me this past Monday 06/22/14. He says that he started feeling sick later that day and especially the following morning. Tuesday morning he felt achy all over and felt very weak. So weak that it was difficult for him to even get out of bed. As he still feels very weak. Also has had chest congestion and cough. A little nasal congestion and really no mucus from his nose. No known fevers or chills. Was also had decreased appetite and just not feeling good.     Home Meds:   Outpatient Prescriptions Prior to Visit  Medication Sig Dispense Refill  . ADVAIR DISKUS 250-50 MCG/DOSE AEPB TAKE 1 INHALATION BY MOUTH TWICE DAILY INTO LUNGS 60 each 4  . Blood Glucose Monitoring Suppl (TRUETRACK BLOOD GLUCOSE) W/DEVICE KIT     . buPROPion (WELLBUTRIN SR) 150 MG 12 hr tablet Take 1 tablet (150 mg total) by mouth 2 (two) times daily. 60 tablet 2  . celecoxib (CELEBREX) 200 MG capsule TAKE 1 CAPSULE BY MOUTH EVERY DAY 30 capsule 5  . clopidogrel (PLAVIX) 75 MG tablet TAKE 1 TABLET BY MOUTH EVERY DAY WITH BREAKFAST 30 tablet 0  . CRESTOR 20 MG tablet TAKE 1 TABLET BY MOUTH EVERY DAY BEFORE BEDTIME 30 tablet 4  . fish oil-omega-3 fatty acids 1000 MG capsule Take 1 g by mouth daily.      . furosemide (LASIX) 20 MG tablet TAKE 1 TABLET BY MOUTH EVERY DAY 30 tablet 5  . isosorbide mononitrate (IMDUR) 30 MG 24 hr tablet TAKE 1 TABLET BY MOUTH EVERY DAY 30 tablet 3  . lisinopril (PRINIVIL,ZESTRIL) 20 MG tablet TAKE 1 TABLET BY MOUTH EVERY DAY 30 tablet 5  . metFORMIN (GLUCOPHAGE) 500 MG tablet Take 1 tablet (500 mg total) by mouth 2 (two) times daily with a meal. 60 tablet 3  . metoprolol succinate (TOPROL-XL) 50 MG 24 hr tablet TAKE 3  TABLETS (150MG) BY MOUTH TWICE DAILY 180 tablet 3  . nitroGLYCERIN (NITROSTAT) 0.4 MG SL tablet Place 1 tablet (0.4 mg total) under the tongue every 5 (five) minutes as needed for chest pain. 25 tablet 1  . pantoprazole (PROTONIX) 40 MG tablet TAKE 1 TABLET BY MOUTH EVERY DAY 30 tablet 11  . sitaGLIPtin (JANUVIA) 100 MG tablet Take 1 tablet (100 mg total) by mouth daily. 30 tablet 3  . TRUETRACK TEST test strip     . warfarin (COUMADIN) 5 MG tablet TAKE 1 TABLET (5MG) BY MOUTH ON MONDAY, WEDNESDAY,FRIDAY AND TAKE 1/2 TABLET (2.5MG) BY MOUTH ON SUNDAY, TUESDAY, THURSDAY, AND SATURDAY 21 tablet 0   No facility-administered medications prior to visit.    Allergies: No Known Allergies    Review of Systems: See HPI for pertinent ROS. All other ROS negative.    Physical Exam: Blood pressure 96/78, pulse 78, temperature 97.5 F (36.4 C), temperature source Oral, resp. rate 18, weight 213 lb (96.616 kg)., Body mass index is 28.88 kg/(m^2). General: WNWD WM.  Appears in no acute distress. HEENT: Normocephalic, atraumatic, eyes without discharge, sclera non-icteric, nares are without discharge. Bilateral auditory canals with cerumen impaction.  Oral cavity moist, posterior pharynx without exudate, erythema, peritonsillar abscess. No tenderness with percussion of  frontal and maxillary sinuses bilaterally.  Neck: Supple. No thyromegaly. No lymphadenopathy. Lungs: Clear bilaterally to auscultation without wheezes, rales, or rhonchi. Breathing is unlabored. Somewhat distant breath sounds and very very slight crackles but otherwise clear. No wheezes. No rhonchi. Heart: Irregular rhythm. Msk:  Strength and tone normal for age. Extremities/Skin: Warm and dry. Neuro: Alert and oriented X 3. Moves all extremities spontaneously. Gait is normal. CNII-XII grossly in tact. Psych:  Responds to questions appropriately with a normal affect.     ASSESSMENT AND PLAN:  64 y.o. year old male with  1. Bacterial  respiratory infection - azithromycin (ZITHROMAX) 250 MG tablet; Day 1: Take 2 daily.  Days 2-5: Take 1 daily.  Dispense: 6 tablet; Refill: 0 Take antibiotic as directed and complete all of it. It down Mucinex DM on his AVS and told him to take this as well to help as expectorant. Follow-up if symptoms do not resolve within 1 week after completion of antibiotic.  Will Have staff irrigate ears to remove cerumen.Pt agreeable.   Marin Olp Staves, [place], [facility] 06/29/2014 4:22 PM

## 2014-07-08 ENCOUNTER — Ambulatory Visit: Payer: Commercial Managed Care - HMO | Admitting: Physician Assistant

## 2014-07-14 ENCOUNTER — Other Ambulatory Visit: Payer: Self-pay | Admitting: Family Medicine

## 2014-07-21 ENCOUNTER — Encounter: Payer: Self-pay | Admitting: *Deleted

## 2014-07-22 ENCOUNTER — Encounter: Payer: Self-pay | Admitting: Physician Assistant

## 2014-07-22 ENCOUNTER — Ambulatory Visit (INDEPENDENT_AMBULATORY_CARE_PROVIDER_SITE_OTHER): Payer: Commercial Managed Care - HMO | Admitting: Physician Assistant

## 2014-07-22 VITALS — BP 140/96 | HR 84 | Temp 98.6°F | Resp 18 | Wt 217.0 lb

## 2014-07-22 DIAGNOSIS — Z7901 Long term (current) use of anticoagulants: Secondary | ICD-10-CM | POA: Diagnosis not present

## 2014-07-22 DIAGNOSIS — I481 Persistent atrial fibrillation: Secondary | ICD-10-CM | POA: Diagnosis not present

## 2014-07-22 DIAGNOSIS — I4819 Other persistent atrial fibrillation: Secondary | ICD-10-CM

## 2014-07-22 LAB — PT WITH INR/FINGERSTICK
INR, fingerstick: 3 — ABNORMAL HIGH (ref 0.80–1.20)
PT, fingerstick: 35.6 seconds — ABNORMAL HIGH (ref 10.4–12.5)

## 2014-07-22 NOTE — Progress Notes (Signed)
Patient ID: BIANCA VESTER MRN: [id number], DOB: 30-Apr-1950, 64 y.o. Date of Encounter: 07/22/2014, 10:31 AM    Chief Complaint:  Chief Complaint  Patient presents with  . PT/INR    M-W-F  9m    2.522mother days     HPI: [age].o. year old white male is here to check PT/INR.  At recent visit with me his INR came back at 1.0. At that time he ended up telling me that he had been missing some Coumadin doses because [place]. Had forgotten to take his Coumadin while he was there and has skipped some doses because of this. At that visit I increased his dosing slightly and had him come back for follow-up INR.  On 06/26/2014 he had follow-up to recheck. INR was up to 1.9. At that time we put him back on his usual dose of 2.5 mg every day except for 5 mg on Monday Wednesday Friday. This is the dose that he had been on long-term and usually had been therapeutic with this dose. He verifies he is taking this dose at this time and verifies that he has had no skips doses since that time that his INR was 1.0. He has had no bleeding and no significant changes in diet.     Home Meds:   Outpatient Prescriptions Prior to Visit  Medication Sig Dispense Refill  . ADVAIR DISKUS 250-50 MCG/DOSE AEPB TAKE 1 INHALATION BY MOUTH TWICE DAILY INTO LUNGS 60 each 4  . azithromycin (ZITHROMAX) 250 MG tablet Day 1: Take 2 daily.  Days 2-5: Take 1 daily. 6 tablet 0  . Blood Glucose Monitoring Suppl (TRUETRACK BLOOD GLUCOSE) W/DEVICE KIT     . buPROPion (WELLBUTRIN SR) 150 MG 12 hr tablet Take 1 tablet (150 mg total) by mouth 2 (two) times daily. 60 tablet 2  . buPROPion (WELLBUTRIN SR) 150 MG 12 hr tablet TAKE 1 TABLET BY MOUTH TWICE DAILY 60 tablet 3  . celecoxib (CELEBREX) 200 MG capsule TAKE 1 CAPSULE BY MOUTH EVERY DAY 30 capsule 4  . clopidogrel (PLAVIX) 75 MG tablet Take 1 tablet (75 mg total) by mouth  daily. 30 tablet 3  . CRESTOR 20 MG tablet TAKE 1 TABLET BY MOUTH EVERY DAY BEFORE BEDTIME 30 tablet 4  . fish oil-omega-3 fatty acids 1000 MG capsule Take 1 g by mouth daily.      . furosemide (LASIX) 20 MG tablet TAKE 1 TABLET BY MOUTH EVERY DAY 30 tablet 5  . isosorbide mononitrate (IMDUR) 30 MG 24 hr tablet TAKE 1 TABLET BY MOUTH EVERY DAY 30 tablet 3  . JANUVIA 100 MG tablet TAKE 1 TABLET BY MOUTH EVERY DAY 30 tablet 3  . lisinopril (PRINIVIL,ZESTRIL) 20 MG tablet TAKE 1 TABLET BY MOUTH EVERY DAY 30 tablet 5  . metFORMIN (GLUCOPHAGE) 500 MG tablet TAKE 1 TABLET BY MOUTH TWICE DAILY WITH MEALS 60 tablet 3  . metoprolol succinate (TOPROL-XL) 50 MG 24 hr tablet TAKE 3 TABLETS (150MG) BY MOUTH TWICE DAILY 180 tablet 3  . nitroGLYCERIN (NITROSTAT) 0.4 MG SL tablet Place 1 tablet (0.4 mg total) under the tongue every 5 (five) minutes as needed for chest pain. 25 tablet 1  . pantoprazole (PROTONIX) 40 MG tablet TAKE 1 TABLET BY MOUTH EVERY DAY 30 tablet 11  . TRUETRACK TEST test strip     . warfarin (COUMADIN) 5 MG tablet TAKE  1 TABLET (5MG) BY MOUTH ON MONDAY, WEDNESDAY,FRIDAY AND TAKE 1/2 TABLET (2.5MG) BY MOUTH ON SUNDAY, TUESDAY, THURSDAY, AND SATURDAY 30 tablet 3   No facility-administered medications prior to visit.    Allergies: No Known Allergies    Review of Systems: See HPI for pertinent ROS. All other ROS negative.    Physical Exam: Blood pressure 140/96, pulse 84, temperature 98.6 F (37 C), temperature source Oral, resp. rate 18, weight 217 lb (98.431 kg)., Body mass index is 29.42 kg/(m^2). General:  WNWD WM. Appears in no acute distress. Neck: Supple. No thyromegaly. No lymphadenopathy. Lungs: Clear bilaterally to auscultation without wheezes, rales, or rhonchi. Breathing is unlabored. Heart: Irregular rhytMsk:  Strength and tone normal for age. Extremities/Skin: Warm and dry.  No edema. Neuro: Alert and oriented X 3. Moves all extremities spontaneously. Gait is normal.  CNII-XII grossly in tact. Psych:  Responds to questions appropriately with a normal affect.   Results for orders placed or performed in visit on 07/22/14  PT with INR/Fingerstick  Result Value Ref Range   PT, fingerstick 35.6 (H) 10.4 - 12.5 seconds   INR, fingerstick 3.0 (H) 0.80 - 1.20     ASSESSMENT AND PLAN:  64 y.o. year old male with  1. Long term current use of anticoagulant therapy - PT with INR/Fingerstick  INR therapeutic. Continue current Coumadin dosing of 2.5 mg every day except for 5 mg on Mondays Wednesday Fridays. Return to office to recheck INR 4 weeks.  Reminded him of making sure to take his Coumadin every single day and to make sure he does not skip doses even when he is out of town. Reminded him importance of this medication and reminded him importance of taking it correctly.  2. Chronic anticoagulation  3. Atrial fibrillation   Signed, [address] [place], [place], [facility] 07/22/2014 10:31 AM

## 2014-07-27 ENCOUNTER — Encounter: Payer: Self-pay | Admitting: Physician Assistant

## 2014-08-07 NOTE — Discharge Summary (Signed)
PATIENT NAME:  [name] Cook, [name] Cook MR#:  [id number] DATE OF BIRTH:  [birth year]  DATE OF ADMISSION:  01/23/2013 DATE OF DISCHARGE:  01/23/2013  PRIMARY CARE PHYSICIAN: Dr. Brunetta Genera.  CARDIOLOGIST: Dr. Lovena Cook with [facility] in [place].   The patient will be transferred to [facility] today.   PRESENTING COMPLAINT: Palpitations.   DISCHARGE DIAGNOSES:  1.  Acute on chronic rapid atrial fibrillation.  2.  Hypotension.  3.  Severe hypomagnesemia.  4.  Type 2 diabetes.  5.  Hyperlipidemia.  6.  Gastroesophageal reflux disease.   CODE STATUS: Full code.   The patient is being transferred to [facility] for possible ablation treatment for his chronic resistant rapid atrial fibrillation.   CARDIOLOGY CONSULTATION: Dr. Fletcher Anon.   Urine drug screen positive for MDMA. Magnesium 0.9. B-type natriuretic peptide is 634. UA negative for UTI. Cardiac enzymes negative. Basic metabolic panel is within normal limits. Potassium is 3.4. PT-INR is 26.1 and 2.5. Hemoglobin A1c is 6.3. Lipid profile within normal limits. TSH is 2.13. Chest x-ray: No acute cardiopulmonary abnormality. Triglycerides are 208.  MEDICATIONS AT DISCHARGE:  1.  Norco 5/325, 1 to 2 q.4 hours p.r.n.  2.  Tylenol 325 mg 2 tablets q.4 hours p.r.n.  3.  Digoxin 0.25 mg IV push daily.  4.  Docusate 100 mg b.i.d. p.r.n.  5.  Sliding scale insulin.  6.  Magnesium 4 grams IV q.8 hourly. 7.  Metformin 1000 mg b.i.d.  8.  Zofran 4 mg IV q.4 hours p.r.n.  9.  Protonix 40 mg daily.  10. Pravachol 40 mg daily.  11. Senokot 1 tablet p.o. b.i.d. p.r.n.  12. Januvia 50 mg b.i.d.  13. Warfarin 5 mg on Monday, Wednesday, Friday and 2.5 mg on Sunday, Tuesday, Thursday and Saturday.   BRIEF SUMMARY OF HOSPITAL COURSE: [name] is a 64 year old Caucasian gentleman with history of chronic atrial fibrillation, on Coumadin for chronic anticoagulation, hypertension, hyperlipidemia and morbid obesity, who had undergone ablation therapy in 2002 for  his chronic atrial fibrillation. He comes into the Emergency Room with:  1.  Acute on chronic rapid atrial fibrillation with RVR. The patient has been tried on most of the majority of the rate blocking agents as outpatient by Dr. Lovena Cook his primary cardiologist, along with amiodarone, which gave him side effects of elevated LFTs. His amiodarone was taken off and his metoprolol was increased to Toprol-XL 100 mg b.i.d. The patient continues to remain in rapid atrial fibrillation with heart rate in the 120s to 130s. His blood pressure dropped down into the 80s, which improved somewhat after IV fluid bolus. The patient was started on IV digoxin given his low blood pressure. He was seen by Dr. Fletcher Anon, who spoke with Dr. Lovena Cook. Recommendations are to transfer the patient to [facility] for possible ablation therapy tomorrow. [name] with the plan and is going to be transferred later to [facility] under Dr. Tanna Furry service.  2.  Hypotension secondary to rapid atrial fibrillation and RVR, improved with IV fluids. We will hold off on any hypotensive agents.  3.  Type 2 diabetes. The patient is continued on his p.o. home meds.  4.  Hyperlipidemia. Continue Crestor and omega-3 fatty acid.  5.  Osteoarthritis. His Celebrex has been discontinued.  6.  DVT prophylaxis. The patient is already on Coumadin with a therapeutic INR of 2.5.  7.  Hospital stay otherwise remained stable. The patient is going to be transferred to [facility] later today for possible ablation  therapy.  TIME SPENT: 40 minutes. ____________________________ Hart [place] Posey Pronto, MD sap:aw D: 01/23/2013 09:02:18 ET T: 01/23/2013 09:10:40 ET JOB#: [id number]  cc: Tahiri Shareef A. Posey Pronto, MD, <Dictator> Meindert A. Brunetta Genera, MD DR. Linzie Collin MD ELECTRONICALLY SIGNED 02/07/2013 15:01

## 2014-08-07 NOTE — H&P (Signed)
PATIENT NAME:  [name] Cook, [name] Cook MR#:  [id number] DATE OF BIRTH:  Apr 14, 1951  DATE OF ADMISSION:  01/23/2013  REASON FOR ADMISSION: Palpitations, rapid heart rate and low blood pressure.   PRIMARY CARE PHYSICIAN: Dr. Brunetta Genera.    CARDIOLOGIST: [name] with the [facility] in [place].   HISTORY OF PRESENT ILLNESS: This is a very nice 64 year old gentleman who has history of chronic atrial fibrillation. He is status post an ablation back in 2002. It lasted for 10 years, but now he is back into atrial fibrillation. He is being anticoagulated with Coumadin. Apparently, he has been in his normal state of health, taking all of his medications, not having any major problems up until tonight. The patient was sitting up on the couch without doing any exertional activity and started having some palpitations. His heart rate was going really fast. His pulse was bouncing, and he was starting to get concerned. Soon after he noticed that, he started having some pressure in the chest. No pain, just pressure he describes as an intensity of 2 to 3 out of 10 without any radiation. No nausea, vomiting or diaphoresis. The patient decided to come to the ER. In the ER, he was evaluated. His heart rate was in the 150s. His blood pressure was 80s/60s for which he was admitted to the critical care unit. The patient actually had an initial blood pressure of 109/61. He received 2 boluses of diltiazem, and his blood pressure dropped significantly, 80s/70s. The patient at this moment is stable, comfortable. No chest pain. No chest pressure. He denies any significant dizziness or any other symptomatology. The patient states that he has not changed any until recently other than his metoprolol that was increased to 100 mg from previous dose of 75 twice daily. He was in the hospital within the last month at [place]. Hospitalist service is admitting this patient for evaluation and control.   REVIEW OF SYSTEMS:    CONSTITUTIONAL: No fever. No fatigue. No weakness, weight loss or weight gain.  EYES: No blurry vision, double vision.  EARS, NOSE, THROAT: No difficulty swallowing. No tinnitus.  RESPIRATORY: No cough, wheezing or hemoptysis. No shortness of breath, although the patient states that with the pressure, it was difficult to take really deep breaths but his oxygen saturation has been stable in the upper 90s.  CARDIOVASCULAR: Positive palpitations. Positive increased heart rate. Positive chest pressure. No orthopnea. No edema. No syncope.  GASTROINTESTINAL: No nausea, vomiting, abdominal pain, constipation, diarrhea.  GENITOURINARY: No dysuria, hematuria or changes in frequency. No prostatitis.  ENDOCRINE: No polyuria, polydipsia, polyphagia, cold or heat intolerance. The patient has a history of diabetes and hypertension, but he states it is controlled.  HEMATOLOGIC AND LYMPHATIC: No anemia, easy bruising or bleeding.  MUSCULOSKELETAL: Positive for osteoarthritis of knees, hips and back.  NEUROLOGIC: No numbness, tingling or ataxia. No CVAs.  PSYCHIATRIC: No significant insomnia or depression.   PAST MEDICAL HISTORY:  1. Chronic atrial fibrillation.  2. Osteoarthritis.  3. Hyperlipidemia.  4. Tobacco abuse.  5. Hypertension.  6. Noninsulin-dependent diabetes.   ALLERGIES: No known drug allergies.   SURGICAL PROCEDURES: Pulmonary artery vein ablation in 2002. The patient denies any other procedures.   CURRENT MEDICATIONS: Toprol 100 mg twice daily, Pravachol 40 mg once daily, Janumet 1 pill twice daily (the patient does not remember the dose), Celebrex once daily (he does not remember the dose either), Protonix 40 mg once daily, warfarin, the patient takes 5 mg on Monday, Wednesday,  Friday and 2.5 mg on Tuesday, Thursday, Saturday, Sunday.   SOCIAL HISTORY: The patient lives with his girlfriend. He used to smoke at least 1 pack a day to 1/2 pack a day for over 30 years. He quit about 1 year  ago. He does not drink alcohol. He is disabled.    FAMILY HISTORY: Negative for MI. Negative for cancer. Negative for any other medical problems.   PHYSICAL EXAMINATION:  VITAL SIGNS: Current blood pressure of 105/77, pulse 145, respirations 18. Temperature, the patient is afebrile. Pulse ox 97% on 2 liters of oxygen. The patient was 95% on room air.  GENERAL: The patient is alert, oriented x3. No acute distress. No respiratory distress. Hemodynamically stable.  HEENT: His pupils are equal and reactive. Extraocular movements are intact. Mucosa is moist. Anicteric sclerae. Pink conjunctivae. No oral lesions. No oropharyngeal exudates.  NECK: Supple. No JVD. No thyromegaly. No adenopathy. No carotid bruits.  CARDIOVASCULAR: Irregularly irregular rhythm. No murmurs, rubs or gallops are appreciated. Heart rate around 145. No displacement of PMI. No tenderness to palpation of anterior chest wall.  LUNGS: Clear without any wheezing or crepitus. No use of accessory muscles.  ABDOMEN: Soft, nontender, nondistended. No hepatosplenomegaly. No masses. Bowel sounds are positive.  GENITAL: Negative for external lesions.  EXTREMITIES: No edema, cyanosis or clubbing. Pulses +2. Capillary refill less than 3.  SKIN: No rashes, petechiae. Normal turgor.  NEUROLOGIC: Cranial nerves II through XII intact. Strength is 5 out of 5 in all 4 extremities. No focal findings.  PSYCHIATRIC: Normal mood. No agitation. Good judgment.  LYMPHATIC: Negative for lymphadenopathy in the neck or supraclavicular areas.  MUSCULOSKELETAL: No significant joint deformity or joint edema.   LABORATORIES: EKG: Atrial fibrillation with RVR with heart rate in the 150s. Glucose is 119, BUN 23, creatinine 1.4, potassium 3.6, sodium 139. First set of cardiac enzymes was negative. White count is 7.5. Hemoglobin is 14. Platelet count 159.   Chest x-ray did not show any signs of significant vascular congestion, pneumonia or consolidations.    Pending UA, TSH, urinary drug screen.   ASSESSMENT AND PLAN: A 64 year old gentleman with history of chronic atrial fibrillation, hyperlipidemia, osteoarthritis, tobacco abuse, hypertension, diabetes. Comes with atrial fibrillation with rapid ventricular response.  1. Atrial fibrillation with rapid ventricular response: The patient has apparently been taking his Toprol daily. His dose has been increased from 75 to 100. He was recently hospitalized in a different hospital here in [place].  The patient states that at that moment his Toprol was increased from 75 to 100 mg twice daily. The patient is not taking any other antiarrhythmics, and unfortunately his blood pressure is really low and got worse after receiving diltiazem. For that reason, we are going to hold on the use of the metoprolol. We are not going to use anymore diltiazem. The patient is going to be digitalized. His weight is 100 kg. We are going to use a dose of 10 mcg/kg, a total dose of 1 g which going to be given 50% of the dose stat and the rest divided into 2 doses in the next 8 hours. The patient is going to be monitored on the critical care unit, and a cardiology consultation is going to be obtained. If his blood pressure improves a little bit, we are going to start him on an amiodarone drip. As far as his prevention for stroke, the patient is an warfarin and apparently has been taking it daily. The patient did not have an INR  taken, for which we are ordering one. Follow up daily INR. Look for secondary causes of his heart rate being uncontrolled. Infection, acute coronary syndrome, metabolic abnormality, hormonal abnormality. So far, nothing is conclusive.  2. Hypotension: The patient has significant low blood pressure. His mean arterial pressure is around 70 to 75, systolics in the 02H for which we are not able to use most of the antiarrhythmic drugs. Digoxin is going to be our first choice right now. As mentioned above, if the  blood pressure continues to drop, we will give the patient pressors. Levophed could be a good option to prevent increase in his heart rate. Also, as the patient has been hypotensive, a liter of intravenous fluids normal saline has been ordered. The patient looks dry. He does not have any significant diarrhea, vomiting and he states that he has been hydrating himself, although he clinically looks dehydrated.  3. Hyperlipidemia: Continue Pravachol.  4. Diabetes: Insulin sliding scale and continuation of Janumet. His creatinine is 1.4 which needs to be watched closely. If his creatinine continues to go up, anything above 1.5, suggest discontinuing the metformin component of Janumet.  5. Osteoarthritis: Hold Celebrex for now.  6. Gastroesophageal reflux disease: Continue Protonix.  7. Deep vein thrombosis prophylaxis: Continue warfarin. If INR is subtherapeutic, will give Lovenox 1 mg/kg.   CRITICAL CARE TIME: 50 minutes.   CODE STATUS: The patient is a FULL CODE.    ____________________________ [place] Sink, MD rsg:gb D: 01/23/2013 02:39:26 ET T: 01/23/2013 03:27:09 ET JOB#: [id number]  cc: Twilight Sink, MD, <Dictator> Meindert A. Brunetta Genera, MD [place] [name], MD  Roselie Awkward America Brown MD ELECTRONICALLY SIGNED 02/08/2013 22:48

## 2014-08-07 NOTE — Consult Note (Signed)
PATIENT NAME:  [name] Cook, [name] Cook MR#:  [id number] DATE OF BIRTH:  17-Jun-1950  DATE OF CONSULTATION:  01/23/2013  REFERRING PHYSICIAN:  [place] Sink, MD CONSULTING PHYSICIAN:  Mertie Clause. Fletcher Anon, MD  PRIMARY CARDIOLOGIST: [name].   REASON FOR CONSULTATION: Atrial fibrillation with rapid ventricular response.   HISTORY OF PRESENT ILLNESS: This is a pleasant 64 year old male with known history of persistent atrial fibrillation, previous atrial flutter status post ablation in 2002, hypertension, obesity, tobacco use and type 2 diabetes. The patient has failed multiple antiarrhythmic medication for atrial fibrillation. Amiodarone was stopped due to abnormal liver function test. Multaq was stopped due to intolerance and persistent nausea. Dofetilide was not successful. Multiple other antiarrhythmic medications were used. He presented to the Emergency Room with palpitations and tachycardia. He was noted to be in atrial fibrillation with rapid ventricular response with nonspecific ST changes. Heart rate has been ranging from 120 beats per minute to 155 beats per minute. His rate has been difficult to control. His blood pressure is borderline low and thus there has been difficulty in giving him metoprolol. He was given IV digoxin. He has no history of coronary artery disease. Previous cardiac catheterization in 2001 showed normal coronary arteries. Most recent echocardiogram was in January of 2013 which showed normal LV systolic function. He denies any chest discomfort.   PAST MEDICAL HISTORY: 1.  Persistent atrial fibrillation.  2.  Osteoarthritis.  3.  Hyperlipidemia.  4.  Tobacco use.  5.  Hypertension.  6.  Type 2 diabetes.   ALLERGIES: No known drug allergies.   HOME MEDICATIONS: Include: 1.  Toprol 100 mg twice daily. 2.  Pravachol 40 mg daily. 3.  Janumet 1 tablet twice daily. 4.  Celebrex once daily. 5.  Protonix 40 mg once daily.  6.  Warfarin as directed.   SOCIAL HISTORY: He  quit smoking about 1 year ago. He denies any alcohol or recreational drug use. He lives with his girlfriend.   FAMILY HISTORY: Negative for premature coronary artery disease.   REVIEW OF SYSTEMS: A 10 point review of systems was performed. It is negative other than what is mentioned in the HPI.  PHYSICAL EXAMINATION: GENERAL: The patient appears to be at his stated age, in no acute distress.  VITAL SIGNS: Temperature is 97.7, pulse is 130 beats per minute, respiratory rate is 15, blood pressure is 108/78 and oxygen saturation is 98% on room air.  HEENT: Normocephalic, atraumatic.  NECK: No JVD or carotid bruits.  LUNGS: Normal respiratory effort with no use of accessory muscles. Auscultation reveals normal breath sounds.  HEART: Normal PMI. The patient is tachycardic with irregular rhythm. No gallops or murmurs.  ABDOMEN: Benign, nontender and nondistended.  EXTREMITIES: No clubbing, cyanosis or edema.  SKIN: Warm and dry with no rash.  PSYCHIATRIC: He is alert and oriented x 3 with normal mood and affect.   LABORATORY AND DIAGNOSTIC DATA: His creatinine is 1.33. Magnesium was 0.9, which is being replaced. Potassium was 3.4. BNP was 634. Troponin is within normal limits. TSH is normal. Hemoglobin is 13.4. INR is 2.5.   ECG showed atrial fibrillation with rapid ventricular response and nonspecific ST changes.   IMPRESSION: 1.  Atrial fibrillation with rapid ventricular response.  2.  History of hypertension, currently with low blood pressure.  3.  Mildly elevated B-type natriuretic peptide with no clinical heart failure.   RECOMMENDATIONS: The patient has failed multiple antiarrhythmic medications. I discussed the case with [name] who knows the patient very  well. [name] recommends AV nodal ablation and permanent pacemaker placement. He does not think atrial fibrillation ablation would be successful. I will obtain an echocardiogram before the procedure. Continue to use IV digoxin for  now. If blood pressure improves, we can initiate small dose metoprolol. Continue anticoagulation for now with warfarin. INR was 2.5.  ____________________________ Mertie Clause. Fletcher Anon, MD maa:sb D: 01/23/2013 08:32:41 ET T: 01/23/2013 08:54:51 ET JOB#: [id number]  cc: Rogue Jury A. Fletcher Anon, MD, <Dictator> Wellington Hampshire MD ELECTRONICALLY SIGNED 01/27/2013 14:03

## 2014-08-07 NOTE — Consult Note (Signed)
Brief Consult Note: Diagnosis: A-fib with RVR (recurrent).   Patient was seen by consultant.   Consult note dictated.   Comments: The patient failed multiple antiarrhythmic medications.  I spoke with [name]. He recommended AV nodal ablation and pacemaker placement to be done tomorrow at [facility].  Plan: obtain an echo today. Tansfer to [facility] in afternoon. replace electrolytes. Continue Warfarin.  Electronic Signatures: Kathlyn Sacramento (MD)  (Signed 09-Oct-14 08:25)  Authored: Brief Consult Note   Last Updated: 09-Oct-14 08:25 by Kathlyn Sacramento (MD)

## 2014-08-11 ENCOUNTER — Other Ambulatory Visit: Payer: Self-pay | Admitting: Physician Assistant

## 2014-08-12 NOTE — Telephone Encounter (Signed)
Medication refilled per protocol. 

## 2014-08-14 ENCOUNTER — Ambulatory Visit (INDEPENDENT_AMBULATORY_CARE_PROVIDER_SITE_OTHER): Payer: Commercial Managed Care - HMO | Admitting: *Deleted

## 2014-08-14 DIAGNOSIS — I495 Sick sinus syndrome: Secondary | ICD-10-CM

## 2014-08-15 DIAGNOSIS — I495 Sick sinus syndrome: Secondary | ICD-10-CM

## 2014-08-19 ENCOUNTER — Ambulatory Visit (INDEPENDENT_AMBULATORY_CARE_PROVIDER_SITE_OTHER): Payer: Commercial Managed Care - HMO | Admitting: Physician Assistant

## 2014-08-19 ENCOUNTER — Encounter: Payer: Self-pay | Admitting: Physician Assistant

## 2014-08-19 VITALS — BP 162/96 | HR 72 | Temp 97.4°F | Resp 18 | Wt 218.0 lb

## 2014-08-19 DIAGNOSIS — Z7901 Long term (current) use of anticoagulants: Secondary | ICD-10-CM | POA: Diagnosis not present

## 2014-08-19 LAB — PT WITH INR/FINGERSTICK
INR, fingerstick: 4 — ABNORMAL HIGH (ref 0.80–1.20)
PT, fingerstick: 48.1 seconds — ABNORMAL HIGH (ref 10.4–12.5)

## 2014-08-19 NOTE — Progress Notes (Signed)
Patient ID: [name] Cook MRN: [id number], DOB: Mar 29, 1951, 64 y.o. Date of Encounter: 08/19/2014, 11:48 AM    Chief Complaint:  Chief Complaint  Patient presents with  . PT/INR check    5 mg M-W-F, 2.5 mg Su,T,TH,Sa     HPI: 64 y.o. year old male here for Coumadin check. Says that he is taking his Coumadin as directed as reported above. States that he has not been on any antibiotics or any other new medications in the past couple weeks. Does not think he has accidentally taken higher dose than he is supposed to. Has had no bleeding from the gums or any other bleeding.     Home Meds:   Outpatient Prescriptions Prior to Visit  Medication Sig Dispense Refill  . ADVAIR DISKUS 250-50 MCG/DOSE AEPB TAKE 1 INHALATION BY MOUTH TWICE DAILY INTO LUNGS 60 each 4  . Blood Glucose Monitoring Suppl (TRUETRACK BLOOD GLUCOSE) W/DEVICE KIT     . buPROPion (WELLBUTRIN SR) 150 MG 12 hr tablet Take 1 tablet (150 mg total) by mouth 2 (two) times daily. 60 tablet 2  . celecoxib (CELEBREX) 200 MG capsule TAKE 1 CAPSULE BY MOUTH EVERY DAY 30 capsule 4  . clopidogrel (PLAVIX) 75 MG tablet Take 1 tablet (75 mg total) by mouth daily. 30 tablet 3  . CRESTOR 20 MG tablet TAKE 1 TABLET BY MOUTH EVERY DAY BEFORE BEDTIME 30 tablet 5  . fish oil-omega-3 fatty acids 1000 MG capsule Take 1 g by mouth daily.      . furosemide (LASIX) 20 MG tablet TAKE 1 TABLET BY MOUTH EVERY DAY 30 tablet 5  . isosorbide mononitrate (IMDUR) 30 MG 24 hr tablet TAKE 1 TABLET BY MOUTH EVERY DAY 30 tablet 3  . JANUVIA 100 MG tablet TAKE 1 TABLET BY MOUTH EVERY DAY 30 tablet 3  . lisinopril (PRINIVIL,ZESTRIL) 20 MG tablet TAKE 1 TABLET BY MOUTH EVERY DAY 30 tablet 5  . metFORMIN (GLUCOPHAGE) 500 MG tablet TAKE 1 TABLET BY MOUTH TWICE DAILY WITH MEALS 60 tablet 3  . metoprolol succinate (TOPROL-XL) 50 MG 24 hr tablet TAKE 3 TABLETS (150MG) BY MOUTH TWICE DAILY 180 tablet 3  . nitroGLYCERIN (NITROSTAT) 0.4 MG SL tablet Place 1 tablet (0.4  mg total) under the tongue every 5 (five) minutes as needed for chest pain. 25 tablet 1  . pantoprazole (PROTONIX) 40 MG tablet TAKE 1 TABLET BY MOUTH EVERY DAY 30 tablet 11  . TRUETRACK TEST test strip     . warfarin (COUMADIN) 5 MG tablet TAKE 1 TABLET (5MG) BY MOUTH ON MONDAY, WEDNESDAY,FRIDAY AND TAKE 1/2 TABLET (2.5MG) BY MOUTH ON SUNDAY, TUESDAY, THURSDAY, AND SATURDAY 30 tablet 3  . azithromycin (ZITHROMAX) 250 MG tablet Day 1: Take 2 daily.  Days 2-5: Take 1 daily. 6 tablet 0  . buPROPion (WELLBUTRIN SR) 150 MG 12 hr tablet TAKE 1 TABLET BY MOUTH TWICE DAILY 60 tablet 3   No facility-administered medications prior to visit.    Allergies: No Known Allergies    Review of Systems: See HPI for pertinent ROS. All other ROS negative.    Physical Exam: Blood pressure 162/96, pulse 72, temperature 97.4 F (36.3 C), temperature source Oral, resp. rate 18, weight 218 lb (98.884 kg)., Body mass index is 29.56 kg/(m^2). General:  WNWD WM. Appears in no acute distress. Neck: Supple. No thyromegaly. No lymphadenopathy. Lungs: Clear bilaterally to auscultation without wheezes, rales, or rhonchi. Breathing is unlabored. Heart: Irregular rhythm.  Msk:  Strength and  tone normal for age. Extremities/Skin: Warm and dry. Neuro: Alert and oriented X 3. Moves all extremities spontaneously. Gait is normal. CNII-XII grossly in tact. Psych:  Responds to questions appropriately with a normal affect.     ASSESSMENT AND PLAN:  64 y.o. year old male with  1. Long term current use of anticoagulant therapy - PT with INR/Fingerstick Results for orders placed or performed in visit on 08/19/14  PT with INR/Fingerstick  Result Value Ref Range   PT, fingerstick 48.1 (H) 10.4 - 12.5 seconds   INR, fingerstick 4.0 (H) 0.80 - 1.20   Today is a Wednesday. For this week only--- on Wednesday and Friday he will take 2.5 mg (as he does on the other days of the week already). Starting this upcoming Monday, he  will take the 5 mg on Monday Wednesday Friday like he usually does in the past. 2.5 mg other days. Return to our office in 2 weeks to recheck PT/INR.  Signed, [address] [place], [place], [facility] 08/19/2014 11:48 AM

## 2014-08-19 NOTE — Progress Notes (Signed)
Remote pacemaker transmission.   

## 2014-08-23 LAB — CUP PACEART REMOTE DEVICE CHECK
Battery Impedance: 133 Ohm
Battery Remaining Longevity: 124 mo
Battery Voltage: 2.8 V
Brady Statistic AP VP Percent: 88 %
Brady Statistic AP VS Percent: 0 %
Brady Statistic AS VP Percent: 12 %
Brady Statistic AS VS Percent: 0 %
Date Time Interrogation Session: 20160430115628
Lead Channel Impedance Value: 546 Ohm
Lead Channel Impedance Value: 706 Ohm
Lead Channel Setting Pacing Amplitude: 2 V
Lead Channel Setting Pacing Amplitude: 3.5 V
Lead Channel Setting Pacing Pulse Width: 0.4 ms
Lead Channel Setting Sensing Sensitivity: 4 mV

## 2014-08-27 ENCOUNTER — Encounter: Payer: Self-pay | Admitting: Cardiology

## 2014-08-31 ENCOUNTER — Encounter: Payer: Self-pay | Admitting: Physician Assistant

## 2014-09-02 ENCOUNTER — Ambulatory Visit (INDEPENDENT_AMBULATORY_CARE_PROVIDER_SITE_OTHER): Payer: Commercial Managed Care - HMO | Admitting: Physician Assistant

## 2014-09-02 ENCOUNTER — Encounter: Payer: Self-pay | Admitting: Physician Assistant

## 2014-09-02 VITALS — BP 130/90 | HR 62 | Temp 98.3°F | Resp 18 | Wt 221.0 lb

## 2014-09-02 DIAGNOSIS — Z7901 Long term (current) use of anticoagulants: Secondary | ICD-10-CM | POA: Diagnosis not present

## 2014-09-02 DIAGNOSIS — F329 Major depressive disorder, single episode, unspecified: Secondary | ICD-10-CM

## 2014-09-02 DIAGNOSIS — I481 Persistent atrial fibrillation: Secondary | ICD-10-CM | POA: Diagnosis not present

## 2014-09-02 DIAGNOSIS — F32A Depression, unspecified: Secondary | ICD-10-CM

## 2014-09-02 DIAGNOSIS — I4819 Other persistent atrial fibrillation: Secondary | ICD-10-CM

## 2014-09-02 LAB — PT WITH INR/FINGERSTICK
INR, fingerstick: 3.1 — ABNORMAL HIGH (ref 0.80–1.20)
PT, fingerstick: 37.5 seconds — ABNORMAL HIGH (ref 10.4–12.5)

## 2014-09-02 MED ORDER — ESCITALOPRAM OXALATE 10 MG PO TABS: 10.0000 mg | ORAL_TABLET | Freq: Every day | ORAL | Status: DC

## 2014-09-02 NOTE — Progress Notes (Signed)
Patient ID: [name] Cook MRN: [id number], DOB: 1950-09-13, 64 y.o. Date of Encounter: 09/02/2014, 1:25 PM    Chief Complaint:  Chief Complaint  Patient presents with  . PT/INR     HPI: 64 y.o. year old male here for Coumadin check.  THE FOLLOWING IS COPIED FROM HIS OV NOTE ON 08/19/2014:  Says that he is taking his Coumadin as directed as reported above. States that he has not been on any antibiotics or any other new medications in the past couple weeks. Does not think he has accidentally taken higher dose than he is supposed to. Has had no bleeding from the gums or any other bleeding. INR that day was 4.0  Told him to dose Coumadin as follows: Today is a Wednesday. For this week only--- on Wednesday and Friday he will take 2.5 mg (as he does on the other days of the week already). Starting this upcoming Monday, he will take the 5 mg on Monday Wednesday Friday like he usually does in the past. 2.5 mg other days. Return to our office in 2 weeks to recheck PT/INR.   TODAY--09/02/2014: Today patient states that he did take the Coumadin as directed above. He is now back on his usual dose of 2.5 mg every day except 5 mg on Monday Wednesday Friday.  Today he also states that he thinks he needs to get on Lexapro that we had discussed in the past. He states that he is taking the Wellbutrin but that does not seem to be controlling his depression symptoms. Says that recently he has not been sleeping good. Wakes up at about 2 or 3 AM and has a hard time getting back to sleep. In general does not seem to feel very interested in doing things. Decreased motivation. Decreased pleasure in things that he used to have pleasure with. Denies any suicidal ideation or homicidal ideation. Wants add the Lexapro to the Wellbutrin.    Home Meds:   Outpatient Prescriptions Prior to Visit  Medication Sig Dispense Refill  . ADVAIR DISKUS 250-50 MCG/DOSE AEPB TAKE 1 INHALATION BY MOUTH TWICE DAILY INTO LUNGS  60 each 4  . Blood Glucose Monitoring Suppl (TRUETRACK BLOOD GLUCOSE) W/DEVICE KIT     . buPROPion (WELLBUTRIN SR) 150 MG 12 hr tablet Take 1 tablet (150 mg total) by mouth 2 (two) times daily. 60 tablet 2  . celecoxib (CELEBREX) 200 MG capsule TAKE 1 CAPSULE BY MOUTH EVERY DAY 30 capsule 4  . clopidogrel (PLAVIX) 75 MG tablet Take 1 tablet (75 mg total) by mouth daily. 30 tablet 3  . CRESTOR 20 MG tablet TAKE 1 TABLET BY MOUTH EVERY DAY BEFORE BEDTIME 30 tablet 5  . fish oil-omega-3 fatty acids 1000 MG capsule Take 1 g by mouth daily.      . furosemide (LASIX) 20 MG tablet TAKE 1 TABLET BY MOUTH EVERY DAY 30 tablet 5  . isosorbide mononitrate (IMDUR) 30 MG 24 hr tablet TAKE 1 TABLET BY MOUTH EVERY DAY 30 tablet 3  . JANUVIA 100 MG tablet TAKE 1 TABLET BY MOUTH EVERY DAY 30 tablet 3  . lisinopril (PRINIVIL,ZESTRIL) 20 MG tablet TAKE 1 TABLET BY MOUTH EVERY DAY 30 tablet 5  . metFORMIN (GLUCOPHAGE) 500 MG tablet TAKE 1 TABLET BY MOUTH TWICE DAILY WITH MEALS 60 tablet 3  . metoprolol succinate (TOPROL-XL) 50 MG 24 hr tablet TAKE 3 TABLETS (150MG) BY MOUTH TWICE DAILY 180 tablet 3  . nitroGLYCERIN (NITROSTAT) 0.4 MG SL tablet Place  1 tablet (0.4 mg total) under the tongue every 5 (five) minutes as needed for chest pain. 25 tablet 1  . pantoprazole (PROTONIX) 40 MG tablet TAKE 1 TABLET BY MOUTH EVERY DAY 30 tablet 11  . TRUETRACK TEST test strip     . warfarin (COUMADIN) 5 MG tablet TAKE 1 TABLET (5MG) BY MOUTH ON MONDAY, WEDNESDAY,FRIDAY AND TAKE 1/2 TABLET (2.5MG) BY MOUTH ON SUNDAY, TUESDAY, THURSDAY, AND SATURDAY 30 tablet 3   No facility-administered medications prior to visit.    Allergies: No Known Allergies    Review of Systems: See HPI for pertinent ROS. All other ROS negative.    Physical Exam: Blood pressure 130/90, pulse 62, temperature 98.3 F (36.8 C), temperature source Oral, resp. rate 18, weight 221 lb (100.245 kg)., Body mass index is 29.97 kg/(m^2). General:  WNWD WM.  Appears in no acute distress. Neck: Supple. No thyromegaly. No lymphadenopathy. Lungs: Clear bilaterally to auscultation without wheezes, rales, or rhonchi. Breathing is unlabored. Heart: Irregular rhythm.  Msk:  Strength and tone normal for age. Extremities/Skin: Warm and dry. Neuro: Alert and oriented X 3. Moves all extremities spontaneously. Gait is normal. CNII-XII grossly in tact. Psych:  Responds to questions appropriately. Flat affect.     ASSESSMENT AND PLAN:  64 y.o. year old male with  1. Long term current use of anticoagulant therapy - PT with INR/Fingerstick Results for orders placed or performed in visit on 09/02/14  PT with INR/Fingerstick  Result Value Ref Range   PT, fingerstick 37.5 (H) 10.4 - 12.5 seconds   INR, fingerstick 3.1 (H) 0.80 - 1.20   Continue usual dose of 2.5 mg every day except for 5 mg on Monday Wednesday Friday. All of his INRs have been therapeutic with this dose except for the one time where his INR was way subtherapeutic because he had not been taking his Coumadin. He only time it's been supratherapeutic was 5/4 was 4.0 (unknown reason) However he has had lots of INR checks in the therapeutic range on this usual dose so will return to this.  1. Long term current use of anticoagulant therapy - PT with INR/Fingerstick  2. Atrial fibrillation  3. Chronic anticoagulation  4. Depression - escitalopram (LEXAPRO) 10 MG tablet; Take 1 tablet (10 mg total) by mouth daily.  Dispense: 30 tablet; Refill: 1 We'll continue his Wellbutrin as he is taking it. We'll add Lexapro 10 mg daily. Discussed proper expectations of medication with him. Explained that even if this medication is ultimately going to work for him that it will take up to 6 weeks to see effectiveness. If he thinks he is possibly having adverse effects with the medication and call us. Otherwise, even if he is not seeing any improvement, discontinue taking it daily until follow-up.  Will  need follow-up visit in 4 weeks to recheck PT/INR and will discuss his depression symptoms further and further follow-up at that time.   Signed, [address] [place], [place], [facility] 09/02/2014 1:25 PM

## 2014-09-04 ENCOUNTER — Encounter: Payer: Self-pay | Admitting: Internal Medicine

## 2014-09-10 ENCOUNTER — Encounter: Payer: Self-pay | Admitting: Cardiology

## 2014-10-01 ENCOUNTER — Encounter: Payer: Self-pay | Admitting: Physician Assistant

## 2014-10-01 ENCOUNTER — Ambulatory Visit (INDEPENDENT_AMBULATORY_CARE_PROVIDER_SITE_OTHER): Payer: Commercial Managed Care - HMO | Admitting: Physician Assistant

## 2014-10-01 VITALS — BP 140/90 | HR 80 | Temp 97.5°F | Resp 20 | Wt 221.0 lb

## 2014-10-01 DIAGNOSIS — R5383 Other fatigue: Secondary | ICD-10-CM | POA: Diagnosis not present

## 2014-10-01 DIAGNOSIS — F329 Major depressive disorder, single episode, unspecified: Secondary | ICD-10-CM

## 2014-10-01 DIAGNOSIS — E1165 Type 2 diabetes mellitus with hyperglycemia: Secondary | ICD-10-CM

## 2014-10-01 DIAGNOSIS — E785 Hyperlipidemia, unspecified: Secondary | ICD-10-CM

## 2014-10-01 DIAGNOSIS — R972 Elevated prostate specific antigen [PSA]: Secondary | ICD-10-CM

## 2014-10-01 DIAGNOSIS — N182 Chronic kidney disease, stage 2 (mild): Secondary | ICD-10-CM | POA: Diagnosis not present

## 2014-10-01 DIAGNOSIS — I4892 Unspecified atrial flutter: Secondary | ICD-10-CM

## 2014-10-01 DIAGNOSIS — R945 Abnormal results of liver function studies: Secondary | ICD-10-CM

## 2014-10-01 DIAGNOSIS — Z7901 Long term (current) use of anticoagulants: Secondary | ICD-10-CM

## 2014-10-01 DIAGNOSIS — Z720 Tobacco use: Secondary | ICD-10-CM

## 2014-10-01 DIAGNOSIS — F32A Depression, unspecified: Secondary | ICD-10-CM

## 2014-10-01 DIAGNOSIS — E119 Type 2 diabetes mellitus without complications: Secondary | ICD-10-CM | POA: Diagnosis not present

## 2014-10-01 DIAGNOSIS — Z125 Encounter for screening for malignant neoplasm of prostate: Secondary | ICD-10-CM

## 2014-10-01 DIAGNOSIS — R7989 Other specified abnormal findings of blood chemistry: Secondary | ICD-10-CM | POA: Diagnosis not present

## 2014-10-01 DIAGNOSIS — I4891 Unspecified atrial fibrillation: Secondary | ICD-10-CM | POA: Diagnosis not present

## 2014-10-01 DIAGNOSIS — I10 Essential (primary) hypertension: Secondary | ICD-10-CM

## 2014-10-01 DIAGNOSIS — J439 Emphysema, unspecified: Secondary | ICD-10-CM

## 2014-10-01 DIAGNOSIS — I481 Persistent atrial fibrillation: Secondary | ICD-10-CM

## 2014-10-01 DIAGNOSIS — R197 Diarrhea, unspecified: Secondary | ICD-10-CM | POA: Diagnosis not present

## 2014-10-01 DIAGNOSIS — I4819 Other persistent atrial fibrillation: Secondary | ICD-10-CM

## 2014-10-01 DIAGNOSIS — R799 Abnormal finding of blood chemistry, unspecified: Secondary | ICD-10-CM | POA: Diagnosis not present

## 2014-10-01 DIAGNOSIS — G4700 Insomnia, unspecified: Secondary | ICD-10-CM | POA: Insufficient documentation

## 2014-10-01 LAB — COMPLETE METABOLIC PANEL WITH GFR
ALT: 13 U/L (ref 0–53)
AST: 15 U/L (ref 0–37)
Albumin: 4.2 g/dL (ref 3.5–5.2)
Alkaline Phosphatase: 50 U/L (ref 39–117)
BUN: 23 mg/dL (ref 6–23)
CO2: 25 mEq/L (ref 19–32)
Calcium: 8.8 mg/dL (ref 8.4–10.5)
Chloride: 103 mEq/L (ref 96–112)
Creat: 1.42 mg/dL — ABNORMAL HIGH (ref 0.50–1.35)
GFR, Est African American: 61 mL/min
GFR, Est Non African American: 53 mL/min — ABNORMAL LOW
Glucose, Bld: 102 mg/dL — ABNORMAL HIGH (ref 70–99)
Potassium: 4 mEq/L (ref 3.5–5.3)
Sodium: 138 mEq/L (ref 135–145)
Total Bilirubin: 0.7 mg/dL (ref 0.2–1.2)
Total Protein: 6.4 g/dL (ref 6.0–8.3)

## 2014-10-01 LAB — CBC WITH DIFFERENTIAL/PLATELET
Basophils Absolute: 0.1 10*3/uL (ref 0.0–0.1)
Basophils Relative: 1 % (ref 0–1)
Eosinophils Absolute: 0.2 10*3/uL (ref 0.0–0.7)
Eosinophils Relative: 4 % (ref 0–5)
HCT: 40.2 % (ref 39.0–52.0)
Hemoglobin: 13.8 g/dL (ref 13.0–17.0)
Lymphocytes Relative: 32 % (ref 12–46)
Lymphs Abs: 1.9 10*3/uL (ref 0.7–4.0)
MCH: 31.7 pg (ref 26.0–34.0)
MCHC: 34.3 g/dL (ref 30.0–36.0)
MCV: 92.2 fL (ref 78.0–100.0)
MPV: 10.9 fL (ref 8.6–12.4)
Monocytes Absolute: 0.4 10*3/uL (ref 0.1–1.0)
Monocytes Relative: 7 % (ref 3–12)
Neutro Abs: 3.3 10*3/uL (ref 1.7–7.7)
Neutrophils Relative %: 56 % (ref 43–77)
Platelets: 146 10*3/uL — ABNORMAL LOW (ref 150–400)
RBC: 4.36 MIL/uL (ref 4.22–5.81)
RDW: 15.3 % (ref 11.5–15.5)
WBC: 5.9 10*3/uL (ref 4.0–10.5)

## 2014-10-01 LAB — LIPID PANEL
Cholesterol: 134 mg/dL (ref 0–200)
HDL: 47 mg/dL (ref >=40)
LDL Cholesterol: 70 mg/dL (ref 0–99)
Total CHOL/HDL Ratio: 2.9 Ratio
Triglycerides: 86 mg/dL (ref <150)
VLDL: 17 mg/dL (ref 0–40)

## 2014-10-01 LAB — PT WITH INR/FINGERSTICK
INR, fingerstick: 2.5 — ABNORMAL HIGH (ref 0.80–1.20)
PT, fingerstick: 30.5 seconds — ABNORMAL HIGH (ref 10.4–12.5)

## 2014-10-01 LAB — TSH: TSH: 1.869 u[IU]/mL (ref 0.350–4.500)

## 2014-10-01 LAB — HEMOGLOBIN A1C
Hgb A1c MFr Bld: 5.8 % — ABNORMAL HIGH (ref <5.7)
Mean Plasma Glucose: 120 mg/dL — ABNORMAL HIGH (ref <117)

## 2014-10-01 MED ORDER — MIRTAZAPINE 30 MG PO TABS: 30.0000 mg | ORAL_TABLET | Freq: Every day | ORAL | Status: DC

## 2014-10-01 NOTE — Progress Notes (Signed)
D   Patient ID: [name] Cook MRN: [id number], DOB: 10-24-1950, 64 y.o. Date of Encounter: '@DATE' @  Chief Complaint:  Chief Complaint  Patient presents with  . Annual Exam    [facility] CPE  . Coagulation Disorder    coumadin check    HPI: 64 y.o. year old white male  presents for followup office visit. Here to check his PT/INR.  Also to do CPE.  He had his first office visit with me on 06/19/2013.   He had been seen in our office in the past by [name].  ----First visit with him was 01/05/2011.  ----He only saw him one other visit and that  was 04/19/2011.  Since then, patient has been seeing [facility].  [facility].  He had been having his PT INR checked at [facility].  However he wanted to start having it checked at our office.  He presented here on 06/19/13 for PT/INR check and also to re- establish primary care here.  He is on Coumadin for history of atrial fibrillation/atrial flutter. Goal INR is 2-3. He is taking 2.5 mg on all days except for 5 mg on Mondays /  Wednesdays /  Fridays. No skipped doses.  No bleeding.  No significant change in dietary intake of Vitamin K containing foods.  No change in medications.  He is taking his diabetic medications as directed. He checks his blood sugar about 3 times per week at different times of day and always gets good readings but never gets low readings--never anything lower than 80.  At [place] 04/13/14--he said he was having diarrhea for months. No abdominal pain--just diarrhea.  IDecreased Metformin--At OV 06/22/14 pt says diarrhea resolved since decreasing the dose of Metformin.  He is taking blood pressure medications as directed. No lightheadedness. No lower extremity edema.  He is taking his Crestor as directed. No myalgias or other adverse effects.   At [place] 62/23/2015 he says that he did want to discuss one thing. Says that he has been on medications  for depression in the past but then he was without insurance for a while so he had gone off of those medications. He is feeling that he does need to get back on medication for this.  Says that he does not actually have episodes of crying but says that he does feel depressed and feels like he is not really interested in doing things that he used to find pleasure in. He says that there are no unusual stressors going on in his life right now says that things are same as usual but he just doesn't feel right. Feels depressed as above. Also says that it seems to be affecting his sleep and that he will sometimes wake up in the night thinking of things. Says that [name] had him on a medication that he prescribed here but says that he doesn't think that worked very well but cannot remember the name of it. I reviewed his chart and it looks like that was Lexapro. Patient says that he also knows in the past he has been on Wellbutrin at one point and was on Prozac at one point. He says that he remembers that the Wellbutrin worked well for him. At that OV I Rxed Wellbutrin. AT OV 04/13/14-- Pt says he is taking this as directed---now taking BID. He cant tell whether it ishelping much yet or not.   At OV 09/02/2014--he said that  he thought he needed to get on Lexapro that we have discussed in the past. He said he was taking the Wellbutrin but it did not seem to be controlling his depression symptoms. Michela Pitcher that recently he had not been sleeping very well. Was waking up at about 2 or 3 AM and had hard time getting back to sleep. General did not seem very interested in doing things. Decreased motivation. Decreased pleasure in things that he used to have pleasure with. At that visit he was interested in adding Lexapro to Wellbutrin. Added Lexapro 10 mg daily.  10/01/2014--Today he states that he has noticed no difference since he added the Lexapro. Says that in the past he did feel that the Wellbutrin was helping and feels  that he should continue this but says he is not feeling any additional improvement with adding the Lexapro. He feels that he has a "negative attitude all the time "even when there is nothing to be negative about. Says that he is still having problems with insomnia. Says "last night and went to bed at 10 PM woke up at 2 AM. Rate from 2 AM to 3:30. It did go back to sleep around 3:30 but woke at 6 AM.   At office visit 62/7/16 his INR is subtherapeutic at 1.0. Prior to this, all of his INR checks here have been therapeutic and he has always been very compliant with taking his Coumadin and coming in for INR checks as directed. Has been on current dose of 2.5 all days except for 5 mg on Monday Wednesday Friday for a long time. At that [place] when I confronted him about why his INR is so low-- at first he says he may have skipped a couple of doses.  I discussed that an INR of 1.0 is the same as someone who is not on Coumadin at all.  After further discussion, he said that he has been out-of-town at his girlfriend's parents in [place], [place] and says that it has been "chaos there "says that they have sitters day and night with her parents.  Said he had been taking his morning pills every day because he remembers those. However, regarding the medicines that he usually takes later in the day--those are the ones that he has often been forgetting to take. That day I reviewed with him the importance of Coumadin and that this is one of the very most important medicines he is on. Reminded him of his A. fib and fact that he can form clot in his heart which can then go to his brain and cause major stroke.   At last 62-3 OVs, I reviewed that the last PSA was elevated and that I referred to urology but he had deferred secondary to cost. Again today he still says that he cannot afford to pay the co-pay to go to urology. I explained to him that there can be multiple causes for elevated PSA but one of those causes is  prostate cancer and the necessity for follow-up.    Past Medical History  Diagnosis Date  . Persistent atrial fibrillation     recurrent atrial flutter since 2001 s/p DCCVs, multiple failed AADs, h/o tachy-mediated cardiomyopathy  . Hypertension     with hypertensive heart disease  . Hyperlipidemia   . Atrial flutter     radiofrequency ablation in 2001  . Obesity   . Chronic anticoagulation     chronic Coumadin anticoagulation  . Tobacco abuse   . Chronic obstructive pulmonary  disease 04/20/2011  . GERD (gastroesophageal reflux disease)   . CAD (coronary artery disease)     Nonobstructive. Cardiac cath in 2001-50% mid RI, normal LM, LAD, RCA  . Shortness of breath     "can come on at any time" (03/14/2013)  . Sleep apnea     "dx'd; couldn't wear the mask" (03/14/2013)  . Diabetes mellitus, type 2   . Arthritis     "knees and lower back" (03/14/2013)     Home Meds:  Outpatient Prescriptions Prior to Visit  Medication Sig Dispense Refill  . ADVAIR DISKUS 250-50 MCG/DOSE AEPB TAKE 1 INHALATION BY MOUTH TWICE DAILY INTO LUNGS 60 each 4  . Blood Glucose Monitoring Suppl (TRUETRACK BLOOD GLUCOSE) W/DEVICE KIT     . buPROPion (WELLBUTRIN SR) 150 MG 12 hr tablet Take 1 tablet (150 mg total) by mouth 2 (two) times daily. 60 tablet 2  . celecoxib (CELEBREX) 200 MG capsule TAKE 1 CAPSULE BY MOUTH EVERY DAY 30 capsule 4  . clopidogrel (PLAVIX) 75 MG tablet Take 1 tablet (75 mg total) by mouth daily. 30 tablet 3  . CRESTOR 20 MG tablet TAKE 1 TABLET BY MOUTH EVERY DAY BEFORE BEDTIME 30 tablet 5  . escitalopram (LEXAPRO) 10 MG tablet Take 1 tablet (10 mg total) by mouth daily. 30 tablet 1  . fish oil-omega-3 fatty acids 1000 MG capsule Take 1 g by mouth daily.      . furosemide (LASIX) 20 MG tablet TAKE 1 TABLET BY MOUTH EVERY DAY 30 tablet 5  . isosorbide mononitrate (IMDUR) 30 MG 24 hr tablet TAKE 1 TABLET BY MOUTH EVERY DAY 30 tablet 3  . JANUVIA 100 MG tablet TAKE 1 TABLET BY MOUTH  EVERY DAY 30 tablet 3  . lisinopril (PRINIVIL,ZESTRIL) 20 MG tablet TAKE 1 TABLET BY MOUTH EVERY DAY 30 tablet 5  . metFORMIN (GLUCOPHAGE) 500 MG tablet TAKE 1 TABLET BY MOUTH TWICE DAILY WITH MEALS 60 tablet 3  . metoprolol succinate (TOPROL-XL) 50 MG 24 hr tablet TAKE 3 TABLETS (150MG) BY MOUTH TWICE DAILY 180 tablet 3  . nitroGLYCERIN (NITROSTAT) 0.4 MG SL tablet Place 1 tablet (0.4 mg total) under the tongue every 5 (five) minutes as needed for chest pain. 25 tablet 1  . pantoprazole (PROTONIX) 40 MG tablet TAKE 1 TABLET BY MOUTH EVERY DAY 30 tablet 11  . TRUETRACK TEST test strip     . warfarin (COUMADIN) 5 MG tablet TAKE 1 TABLET (5MG) BY MOUTH ON MONDAY, WEDNESDAY,FRIDAY AND TAKE 1/2 TABLET (2.5MG) BY MOUTH ON SUNDAY, TUESDAY, THURSDAY, AND SATURDAY 30 tablet 3   No facility-administered medications prior to visit.     Allergies: No Known Allergies  History   Social History  . Marital Status: Legally Separated    Spouse Name: N/A  . Number of Children: 1  . Years of Education: N/A   Occupational History  . Unemployed   .     Social History Main Topics  . Smoking status: Former Smoker -- 1.00 packs/day for 42 years    Types: Cigarettes    Quit date: 12/31/2011  . Smokeless tobacco: Never Used  . Alcohol Use: Yes     Comment: 03/14/2013 "stopped drinking back in 2002; never had problem w/it"  . Drug Use: No  . Sexual Activity: Not Currently   Other Topics Concern  . 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          Family History  Problem Relation Age of Onset  . Alzheimer's disease Mother   . Osteoporosis Mother      Review of Systems:  See HPI for pertinent ROS. All other ROS negative.    Physical Exam: Blood pressure 140/90, pulse 80, temperature 97.5 F (36.4 C), temperature source  Oral, resp. rate 20, weight 221 lb (100.245 kg)., Body mass index is 29.97 kg/(m^2). General: WNWD WM. Appears in no acute distress. Neck: Supple. No thyromegaly. No lymphadenopathy. No carotid bruits. Lungs: Clear bilaterally to auscultation without wheezes, rales, or rhonchi. Breathing is unlabored. Heart: Irreg rhythm Abdomen: Soft, non-tender, non-distended with normoactive bowel sounds. No hepatomegaly. No rebound/guarding. No obvious abdominal masses. Musculoskeletal:  Strength and tone normal for age. Extremities/Skin: Warm and dry. No clubbing or cyanosis. No edema. No rashes or suspicious lesions. Neuro: Alert and oriented X 3. Moves all extremities spontaneously. Gait is normal. CNII-XII grossly in tact. Psych:  Responds to questions appropriately with a normal affect. Diabetic foot exam: Inspection is normal.  Sensation is intact and normal.  He has 2+ posterior tibial pulses bilaterally. Trace dorsalis pedis pulses bilaterally.       ASSESSMENT AND PLAN:  64 y.o. year old male with   FINANCIAL DISTRESS---SEE # 12 AND # 13 BELOW. ---CANNOT AFFORD COPAY TO SEE GI OR UROLOGY.                                       ----ALSO, SEE HPI---PRIOR TO COMING TO THIS OFFICE, WENT TO [facility]PART[facility]    -----WILL HAVE OUR STAFF SEE IF THN OR SOME OTHER ASSISTANCE CAN HELP HIM  He states he has Medicare secondary to Disability--cannot use savings cards with Medicare.    1.Coronary Artery Disease He underwent cardiac catheterization 03/17/2013. Was not favorable for PCI. He has had medical treatment of CAD.    2. Chronic anticoagulation---PT/INR today (10/01/2014)= 2.5. Discussed at office visit. Continue current dose of Coumadin which is 5 mg every day except for 2.5 mg on Mondays Wednesdays Fridays.      Recheck 4 weeks. 3. Atrial fibrillation----------see HPI--He recently sees Cardiology and sees EP-- [name] 4. Atrial flutter 5. Pacemaker  6. Depression Continue  Wellbutrin.Wean off Lexapro. Add Remeron 59m 1 po QHS.  Remeron added at visit 10/01/2014. Patient informed to call if this causes adverse effects.  7. Insomnia See # 6 above. - mirtazapine (REMERON) 30 MG tablet; Take 1 tablet (30 mg total) by mouth at bedtime.  Dispense: 30 tablet; Refill: 1  8. Other fatigue - CBC with Differential/Platelet - TSH  9. Diabetes mellitus, type 2  - Microalbumin, urine---done 06/26/2013, 10/01/2014 Check A1C today  He states that he checks his blood sugar about 3 times per week. Checks at different times of day. Never gets < 80. He is on ACE inhibitor. He is on statin therapy. NOT on ASA----He is on Plavix and Coumadin per Cardiology  Kidney function is normal. Foot exam is stable. Patient states that he had an eye exam about one year ago. Is due to schedule followup and patient plans to schedule this. He was told that exam showed "no diabetic damage " [place] he states that his eye exam is due "right now ".  I wrote on his AVS to remind him to call to schedule follow-up eye exam.  He received Pneumovax 23 here 01/05/2011 No further pneumonia vaccine indicated until age 24  10. Hypertension At goal. On ACE inhibitor. BMET normal. Pressure is elevated but I do not want to start adjusting these medications as he is seeing cardiology and EP and they are adjusting medications. We'll leave this to them to follow-up.  11. Hyperlipidemia At last lab -- LFTs normal. Lipid Panel is excellent. Continued Crestor 20 mg. He is fasting today and we'll recheck FLP LFT today.  10. Chronic obstructive pulmonary disease Quit smoking 2013.  11. GASTROESOPHAGEAL REFLUX DISEASE Protonix can be used with Plavix to continue this one.  12. Prostate cancer screening 13. Elevated PSA -PSA was elevated 02/05/2014.--4.91.  I ordered referral to urology. Patient reported that he could not afford the co-pay and did not follow-up with urology.  PSA repeated  06/22/2014---4.71---I had noted for staff to see if  can find any type of assistance program to help cover cost so that he can follow-up with urology.However, this has not happened.  AT [place] 10/01/2014--I am discussing with staff again to investigate options for financial assistance/other services, options to help him.   32. Screening for colorectal cancer The following is copied form his OV on 06/26/2013:   "He reports that he has never had a colonoscopy. Says in the past he was going to -but that's when he then lost his insurance. He is agreeable to proceed with this and is agreeable for me to do her referral for followup with GI. - Ambulatory referral to Gastroenterology "  At f/u OV 11/06/2013 he reports: He cannot even afford the co-pay to see GI for the office visit. His co-pay for that visit was going to be $45. He does not even know what his cost would be for the colonoscopy itself.  14. Immunizations: He received Pneumovax 23 here in the office on 01/05/2011 No further pneumonia vaccine indicated until age 29. He received tetanus here at our office 01/05/2011 Influenza Vaccine----Given here 04/13/2014 Zostavax: So far, have not even discussed, as cost will likely be prohibitive      Subjective:   Patient presents for Medicare Annual/Subsequent preventive examination.   Review Past Medical/Family/Social: These have all been reviewed and updated.   Risk Factors  Current exercise habits: He says that they walk in thrift stores. No other exercise. Dietary issues discussed: He has been repeatedly educated regarding low cholesterol low sodium and diabetic diet.  Cardiac risk factors: DM, HTN, HLD, Past smoker, Male, Age  Depression Screen  (Note: if answer to either of the following is "Yes", a more complete depression screening is indicated)  Over the past two weeks, have you felt down, depressed or hopeless? Yes--3/3 Over the past two weeks, have you felt little interest or pleasure  in doing things?--Some--2/3 Do you often feel hopeless? Some Do you cry easily over simple problems? No   Activities of Daily Living  In your present state of health, do you have any difficulty performing the following activities?:  Driving? No  Managing money? No  Feeding yourself? No  Getting from bed to chair? No  Climbing a flight of stairs? No  Preparing food and eating?: No  Bathing or showering? No  Getting dressed: No  Getting to the toilet? No  Using the toilet:No  Moving around from place to place: No  In the past year have you fallen or had a near  fall?:No  Are you sexually active? No  Do you have more than one partner? No   Hearing Difficulties: No  Do you often ask people to speak up or repeat themselves? No  Do you experience ringing or noises in your ears? No Do you have difficulty understanding soft or whispered voices? No  Do you feel that you have a problem with memory? No Do you often misplace items? No  Do you feel safe at home? Yes  Cognitive Testing  Alert? Yes Normal Appearance?Yes  Oriented to person? Yes Place? Yes  Time? Yes  Recall of three objects? Yes  Can perform simple calculations? Yes  Displays appropriate judgment?Yes  Can read the correct time from a watch face?Yes   List the Names of Other Physician/Practitioners you currently use:  Cardiology and EP  Indicate any recent Medical Services you may have received from other than [facility] providers in the past year (date may be approximate).  None  Screening Tests / Date Colonoscopy--- see above. Cannot afford.                     Zostavax ------- see above. Cannot afford. Mammogram ---N/A Influenza Vaccine --up to date Tetanus/tdap--- up to date     Assessment:    Annual wellness medicare exam   Plan:    During the course of the visit the patient was educated and counseled about appropriate screening and preventive services including:  Screening mammography  Colorectal cancer  screening  Shingles vaccine. Prescription given to that she can get the vaccine at the pharmacy or Medicare part D.  Screen + for depression. PHQ- 9 score of 12 (moderate depression). We discussed the options of counseling versus possibly a medication. I encouraged her strongly think about the counseling. She is going through some medical problems currently and her husband is as well Mrs. been very stressful for her. She says she will think about it. She does have Xanax to use as needed. Though she may benefit from an SSRI for her more depressive type symptoms but she wants to hold off at this time.  I aksed her to please have her cardioloist send records since we have none on file.  Diet review for nutrition referral? Yes ____ Not Indicated __x__  Patient Instructions (the written plan) was given to the patient.  Medicare Attestation  I have personally reviewed:  The patient's medical and social history  Their use of alcohol, tobacco or illicit drugs  Their current medications and supplements  The patient's functional ability including ADLs,fall risks, home safety risks, cognitive, and hearing and visual impairment  Diet and physical activities  Evidence for depression or mood disorders  The patient's weight, height, BMI, and visual acuity have been recorded in the chart. I have made referrals, counseling, and provided education to the patient based on review of the above and I have provided the patient with a written personalized care plan for preventive services.         [address] [place], [place], [facility] 10/01/2014 9:31 AM

## 2014-10-02 ENCOUNTER — Other Ambulatory Visit: Payer: Self-pay | Admitting: *Deleted

## 2014-10-02 LAB — MICROALBUMIN, URINE: Microalb, Ur: 1 mg/dL (ref <2.0)

## 2014-10-02 MED ORDER — SITAGLIPTIN PHOSPHATE 50 MG PO TABS: 50.0000 mg | ORAL_TABLET | Freq: Every day | ORAL | Status: DC

## 2014-10-06 ENCOUNTER — Telehealth: Payer: Self-pay | Admitting: Physician Assistant

## 2014-10-06 NOTE — Telephone Encounter (Signed)
Patient calling to let mary beth know that he cannot take the mirtazapine that was prescribed to him, makes him feel strange  Please call him at [place]

## 2014-10-06 NOTE — Telephone Encounter (Signed)
lmtcb

## 2014-10-07 NOTE — Telephone Encounter (Signed)
Medication made him feel like he was on cloud 9 for over a day.  Is there something else he can use?

## 2014-10-07 NOTE — Telephone Encounter (Signed)
Yes. Remove the Remeron from the medicine list and instead order Trazadone. Trazadone 50mg  1 po QHS # 30+2.

## 2014-10-08 MED ORDER — TRAZODONE HCL 50 MG PO TABS: 50.0000 mg | ORAL_TABLET | Freq: Every day | ORAL | Status: DC

## 2014-10-08 NOTE — Telephone Encounter (Signed)
Pt aware of medication change.  New Rx to pharmacy.  Med list updated

## 2014-10-14 ENCOUNTER — Emergency Department (HOSPITAL_COMMUNITY): Payer: Commercial Managed Care - HMO

## 2014-10-14 ENCOUNTER — Ambulatory Visit (INDEPENDENT_AMBULATORY_CARE_PROVIDER_SITE_OTHER): Payer: Commercial Managed Care - HMO | Admitting: Physician Assistant

## 2014-10-14 ENCOUNTER — Inpatient Hospital Stay (HOSPITAL_COMMUNITY)
Admission: EM | Admit: 2014-10-14 | Discharge: 2014-10-17 | DRG: 247 | Disposition: A | Discharge status: Home / Self Care | Payer: Commercial Managed Care - HMO | Attending: Internal Medicine | Admitting: Internal Medicine

## 2014-10-14 ENCOUNTER — Encounter (HOSPITAL_COMMUNITY): Payer: Self-pay | Admitting: Emergency Medicine

## 2014-10-14 ENCOUNTER — Encounter: Payer: Self-pay | Admitting: Physician Assistant

## 2014-10-14 VITALS — BP 110/70 | HR 80 | Temp 98.2°F | Resp 16 | Ht 73.0 in | Wt 228.0 lb

## 2014-10-14 DIAGNOSIS — N182 Chronic kidney disease, stage 2 (mild): Secondary | ICD-10-CM | POA: Diagnosis not present

## 2014-10-14 DIAGNOSIS — I2510 Atherosclerotic heart disease of native coronary artery without angina pectoris: Secondary | ICD-10-CM

## 2014-10-14 DIAGNOSIS — G4730 Sleep apnea, unspecified: Secondary | ICD-10-CM | POA: Diagnosis present

## 2014-10-14 DIAGNOSIS — I129 Hypertensive chronic kidney disease with stage 1 through stage 4 chronic kidney disease, or unspecified chronic kidney disease: Secondary | ICD-10-CM | POA: Diagnosis not present

## 2014-10-14 DIAGNOSIS — I481 Persistent atrial fibrillation: Secondary | ICD-10-CM | POA: Diagnosis not present

## 2014-10-14 DIAGNOSIS — Z7902 Long term (current) use of antithrombotics/antiplatelets: Secondary | ICD-10-CM | POA: Diagnosis not present

## 2014-10-14 DIAGNOSIS — I249 Acute ischemic heart disease, unspecified: Secondary | ICD-10-CM | POA: Diagnosis not present

## 2014-10-14 DIAGNOSIS — J438 Other emphysema: Secondary | ICD-10-CM

## 2014-10-14 DIAGNOSIS — Z79899 Other long term (current) drug therapy: Secondary | ICD-10-CM

## 2014-10-14 DIAGNOSIS — J449 Chronic obstructive pulmonary disease, unspecified: Secondary | ICD-10-CM | POA: Diagnosis not present

## 2014-10-14 DIAGNOSIS — E785 Hyperlipidemia, unspecified: Secondary | ICD-10-CM | POA: Diagnosis present

## 2014-10-14 DIAGNOSIS — I200 Unstable angina: Secondary | ICD-10-CM

## 2014-10-14 DIAGNOSIS — R079 Chest pain, unspecified: Secondary | ICD-10-CM | POA: Diagnosis not present

## 2014-10-14 DIAGNOSIS — R0789 Other chest pain: Secondary | ICD-10-CM

## 2014-10-14 DIAGNOSIS — I25110 Atherosclerotic heart disease of native coronary artery with unstable angina pectoris: Secondary | ICD-10-CM | POA: Diagnosis not present

## 2014-10-14 DIAGNOSIS — Z87891 Personal history of nicotine dependence: Secondary | ICD-10-CM | POA: Diagnosis not present

## 2014-10-14 DIAGNOSIS — I4892 Unspecified atrial flutter: Secondary | ICD-10-CM | POA: Diagnosis present

## 2014-10-14 DIAGNOSIS — E669 Obesity, unspecified: Secondary | ICD-10-CM | POA: Diagnosis present

## 2014-10-14 DIAGNOSIS — I252 Old myocardial infarction: Secondary | ICD-10-CM

## 2014-10-14 DIAGNOSIS — E119 Type 2 diabetes mellitus without complications: Secondary | ICD-10-CM | POA: Diagnosis not present

## 2014-10-14 DIAGNOSIS — D649 Anemia, unspecified: Secondary | ICD-10-CM | POA: Diagnosis not present

## 2014-10-14 DIAGNOSIS — Z7901 Long term (current) use of anticoagulants: Secondary | ICD-10-CM | POA: Diagnosis not present

## 2014-10-14 DIAGNOSIS — M1990 Unspecified osteoarthritis, unspecified site: Secondary | ICD-10-CM | POA: Diagnosis present

## 2014-10-14 DIAGNOSIS — K219 Gastro-esophageal reflux disease without esophagitis: Secondary | ICD-10-CM | POA: Diagnosis present

## 2014-10-14 DIAGNOSIS — D696 Thrombocytopenia, unspecified: Secondary | ICD-10-CM | POA: Diagnosis present

## 2014-10-14 DIAGNOSIS — Z950 Presence of cardiac pacemaker: Secondary | ICD-10-CM | POA: Diagnosis not present

## 2014-10-14 DIAGNOSIS — Z6830 Body mass index (BMI) 30.0-30.9, adult: Secondary | ICD-10-CM

## 2014-10-14 DIAGNOSIS — I4819 Other persistent atrial fibrillation: Secondary | ICD-10-CM | POA: Diagnosis present

## 2014-10-14 DIAGNOSIS — R0602 Shortness of breath: Secondary | ICD-10-CM | POA: Diagnosis not present

## 2014-10-14 DIAGNOSIS — I480 Paroxysmal atrial fibrillation: Secondary | ICD-10-CM | POA: Diagnosis present

## 2014-10-14 DIAGNOSIS — I10 Essential (primary) hypertension: Secondary | ICD-10-CM | POA: Diagnosis present

## 2014-10-14 DIAGNOSIS — E1165 Type 2 diabetes mellitus with hyperglycemia: Secondary | ICD-10-CM

## 2014-10-14 LAB — GLUCOSE, CAPILLARY: Glucose-Capillary: 99 mg/dL (ref 65–99)

## 2014-10-14 LAB — TSH: TSH: 2.007 u[IU]/mL (ref 0.350–4.500)

## 2014-10-14 LAB — BASIC METABOLIC PANEL
Anion gap: 9 (ref 5–15)
BUN: 18 mg/dL (ref 6–20)
CO2: 25 mmol/L (ref 22–32)
Calcium: 8.9 mg/dL (ref 8.9–10.3)
Chloride: 104 mmol/L (ref 101–111)
Creatinine, Ser: 1.33 mg/dL — ABNORMAL HIGH (ref 0.61–1.24)
GFR calc Af Amer: >60 mL/min (ref >60)
GFR calc non Af Amer: 56 mL/min — ABNORMAL LOW (ref >60)
Glucose, Bld: 84 mg/dL (ref 65–99)
Potassium: 3.8 mmol/L (ref 3.5–5.1)
Sodium: 138 mmol/L (ref 135–145)

## 2014-10-14 LAB — TROPONIN I
Troponin I: <0.03 ng/mL (ref <0.031)
Troponin I: <0.03 ng/mL (ref <0.031)

## 2014-10-14 LAB — CBC
HCT: 38 % — ABNORMAL LOW (ref 39.0–52.0)
Hemoglobin: 12.9 g/dL — ABNORMAL LOW (ref 13.0–17.0)
MCH: 31.5 pg (ref 26.0–34.0)
MCHC: 33.9 g/dL (ref 30.0–36.0)
MCV: 92.7 fL (ref 78.0–100.0)
Platelets: 123 10*3/uL — ABNORMAL LOW (ref 150–400)
RBC: 4.1 MIL/uL — ABNORMAL LOW (ref 4.22–5.81)
RDW: 14 % (ref 11.5–15.5)
WBC: 5.6 10*3/uL (ref 4.0–10.5)

## 2014-10-14 LAB — PROTIME-INR
INR: 3.15 — ABNORMAL HIGH (ref 0.00–1.49)
Prothrombin Time: 31.7 seconds — ABNORMAL HIGH (ref 11.6–15.2)

## 2014-10-14 MED ORDER — INSULIN ASPART 100 UNIT/ML ~~LOC~~ SOLN
0.0000 [IU] | Freq: Three times a day (TID) | SUBCUTANEOUS | Status: DC
  Administered 2014-10-15 – 2014-10-16 (×2): 2 [IU] via SUBCUTANEOUS

## 2014-10-14 MED ORDER — ACETAMINOPHEN 325 MG PO TABS
650.0000 mg | ORAL_TABLET | ORAL | Status: DC | PRN
  Administered 2014-10-16: 650 mg via ORAL
  Filled 2014-10-14: qty 2

## 2014-10-14 MED ORDER — NITROGLYCERIN 0.4 MG SL SUBL: 0.4000 mg | SUBLINGUAL_TABLET | SUBLINGUAL | Status: DC | PRN

## 2014-10-14 MED ORDER — METOPROLOL SUCCINATE ER 50 MG PO TB24
150.0000 mg | ORAL_TABLET | Freq: Two times a day (BID) | ORAL | Status: DC
  Administered 2014-10-14 – 2014-10-17 (×6): 150 mg via ORAL
  Filled 2014-10-14 (×2): qty 1
  Filled 2014-10-14 (×2): qty 3
  Filled 2014-10-14: qty 1
  Filled 2014-10-14: qty 3
  Filled 2014-10-14 (×2): qty 1

## 2014-10-14 MED ORDER — LINAGLIPTIN 5 MG PO TABS
5.0000 mg | ORAL_TABLET | Freq: Every day | ORAL | Status: DC
  Administered 2014-10-15 – 2014-10-17 (×3): 5 mg via ORAL
  Filled 2014-10-14 (×3): qty 1

## 2014-10-14 MED ORDER — ASPIRIN EC 81 MG PO TBEC
81.0000 mg | DELAYED_RELEASE_TABLET | Freq: Every day | ORAL | Status: DC
  Administered 2014-10-15: 81 mg via ORAL
  Filled 2014-10-14 (×2): qty 1

## 2014-10-14 MED ORDER — PANTOPRAZOLE SODIUM 40 MG PO TBEC
40.0000 mg | DELAYED_RELEASE_TABLET | Freq: Every day | ORAL | Status: DC
  Administered 2014-10-15 – 2014-10-17 (×3): 40 mg via ORAL
  Filled 2014-10-14 (×4): qty 1

## 2014-10-14 MED ORDER — OMEGA-3-ACID ETHYL ESTERS 1 G PO CAPS
1.0000 g | ORAL_CAPSULE | Freq: Every day | ORAL | Status: DC
  Administered 2014-10-15 – 2014-10-17 (×3): 1 g via ORAL
  Filled 2014-10-14 (×3): qty 1

## 2014-10-14 MED ORDER — NITROGLYCERIN IN D5W 200-5 MCG/ML-% IV SOLN
25.0000 ug/min | INTRAVENOUS | Status: DC
  Administered 2014-10-14: 5 ug/min via INTRAVENOUS
  Administered 2014-10-15: 10 ug/min via INTRAVENOUS
  Filled 2014-10-14: qty 250

## 2014-10-14 MED ORDER — ASPIRIN 81 MG PO CHEW
324.0000 mg | CHEWABLE_TABLET | Freq: Once | ORAL | Status: AC
Start: 2014-10-14 — End: 2014-10-14
  Administered 2014-10-14: 324 mg via ORAL
  Filled 2014-10-14: qty 4

## 2014-10-14 MED ORDER — ROSUVASTATIN CALCIUM 20 MG PO TABS
20.0000 mg | ORAL_TABLET | Freq: Every day | ORAL | Status: DC
Start: 2014-10-14 — End: 2014-10-17
  Administered 2014-10-14 – 2014-10-16 (×3): 20 mg via ORAL
  Filled 2014-10-14: qty 2
  Filled 2014-10-14 (×4): qty 1

## 2014-10-14 MED ORDER — ONDANSETRON HCL 4 MG/2ML IJ SOLN: 4.0000 mg | Freq: Four times a day (QID) | INTRAMUSCULAR | Status: DC | PRN

## 2014-10-14 MED ORDER — OMEGA-3 FATTY ACIDS 1000 MG PO CAPS: 1.0000 g | ORAL_CAPSULE | Freq: Every day | ORAL | Status: DC

## 2014-10-14 MED ORDER — CLOPIDOGREL BISULFATE 75 MG PO TABS
75.0000 mg | ORAL_TABLET | Freq: Every day | ORAL | Status: DC
  Administered 2014-10-15 – 2014-10-17 (×3): 75 mg via ORAL
  Filled 2014-10-14 (×3): qty 1

## 2014-10-14 MED ORDER — ROSUVASTATIN CALCIUM 20 MG PO TABS: 20.0000 mg | ORAL_TABLET | Freq: Every day | ORAL | Status: DC

## 2014-10-14 MED ORDER — BUPROPION HCL ER (SR) 150 MG PO TB12
150.0000 mg | ORAL_TABLET | Freq: Two times a day (BID) | ORAL | Status: DC
  Administered 2014-10-15 – 2014-10-17 (×5): 150 mg via ORAL
  Filled 2014-10-14 (×7): qty 1

## 2014-10-14 MED ORDER — CELECOXIB 200 MG PO CAPS
200.0000 mg | ORAL_CAPSULE | Freq: Every day | ORAL | Status: DC
  Filled 2014-10-14: qty 1

## 2014-10-14 MED ORDER — METOPROLOL SUCCINATE ER 50 MG PO TB24: 150.0000 mg | ORAL_TABLET | Freq: Every day | ORAL | Status: DC

## 2014-10-14 MED ORDER — MOMETASONE FURO-FORMOTEROL FUM 100-5 MCG/ACT IN AERO: 2.0000 | INHALATION_SPRAY | Freq: Two times a day (BID) | RESPIRATORY_TRACT | Status: DC

## 2014-10-14 MED ORDER — TRAZODONE HCL 50 MG PO TABS
50.0000 mg | ORAL_TABLET | Freq: Every day | ORAL | Status: DC
  Administered 2014-10-14 – 2014-10-16 (×3): 50 mg via ORAL
  Filled 2014-10-14 (×4): qty 1

## 2014-10-14 NOTE — ED Notes (Signed)
Sent from MD office [name] Cook, [place] in [place]) for chest pain. Started 3 days ago; taking nitro at home. Today CP returned at 1030; took 3 nitro and resolved. No more chest pain since 1030 this morning. SOB, cold sweats when it occurs. Increases with activities such as walking, getting up from seated, making bed. Hx of Afib, pacemaker, blockages in the past.

## 2014-10-14 NOTE — ED Notes (Signed)
Cardio MD at bedside

## 2014-10-14 NOTE — ED Provider Notes (Signed)
CSN: [id number]     Arrival date & time 10/14/14  1727 History   First MD Initiated Contact with Patient 10/14/14 1755     Chief Complaint  Patient presents with  . Chest Pain     (Consider location/radiation/quality/duration/timing/severity/associated sxs/prior Treatment) HPI   [name] Cook is a 64 y.o. male who presents for evaluation of intermittent chest pain, since yesterday. Pain is occurred both at rest, and while exerting, such as walking. He was awakened at 1 AM by today. He is taken nitroglycerin sublingual, 7 different times for 7 different episodes of chest pain in the last 48 hours. He relates that the nitroglycerin seems to help the discomfort. The pain is described as central anterior chest and sharp. No nausea or vomiting. He has mild shortness of breath. He does not have chronic heartburn symptoms. He did not eat today. He went to his PCP today and they referred him here, by EMS. He denies recent cough, back pain, weakness or dizziness. His last cardiac catheterization was 2014. History of medically at that time. He has atrial fibrillation and is taking his Coumadin and rate control medicine, regularly. There are no other known modifying factors.   Past Medical History  Diagnosis Date  . Persistent atrial fibrillation     recurrent atrial flutter since 2001 s/p DCCVs, multiple failed AADs, h/o tachy-mediated cardiomyopathy  . Hypertension     with hypertensive heart disease  . Hyperlipidemia   . Atrial flutter     radiofrequency ablation in 2001  . Obesity   . Chronic anticoagulation     chronic Coumadin anticoagulation  . Tobacco abuse   . Chronic obstructive pulmonary disease 04/20/2011  . GERD (gastroesophageal reflux disease)   . CAD (coronary artery disease)     Nonobstructive. Cardiac cath in 2001-50% mid RI, normal LM, LAD, RCA  . Shortness of breath     "can come on at any time" (03/14/2013)  . Sleep apnea     "dx'd; couldn't wear the mask" (03/14/2013)  .  Diabetes mellitus, type 2   . Arthritis     "knees and lower back" (03/14/2013)   Past Surgical History  Procedure Laterality Date  . Atrial flutter ablation  2002    atrial flutter; subsequently developed atrial fibrillation  . Loop recorder implant  2002  . Cardioversion  05/31/2011    Procedure: CARDIOVERSION;  Surgeon: Cristopher Estimable. Lattie Haw, MD;  Location: [facility];  Service: Cardiovascular;  Laterality: N/A;  . Av node ablation  01/24/2013  . Insert / replace / remove pacemaker  01/24/2013     Medtronic Adapta L dual-chamber pacemaker, serial number [id number]   . Carpal tunnel release Left 1980's  . Tibial tuberclerplasty  ~ 2003  . Cardiac catheterization  2002  . Permanent pacemaker insertion N/A 01/24/2013    Procedure: PERMANENT PACEMAKER INSERTION;  Surgeon: [name], MD;  Location: [facility] CATH LAB;  Service: Cardiovascular;  Laterality: N/A;  . Left heart catheterization with coronary angiogram N/A 03/17/2013    Procedure: LEFT HEART CATHETERIZATION WITH CORONARY ANGIOGRAM;  Surgeon: Burnell Blanks, MD;  Location: [facility] CATH LAB;  Service: Cardiovascular;  Laterality: N/A;   Family History  Problem Relation Age of Onset  . Alzheimer's disease Mother   . Osteoporosis Mother    History  Substance Use Topics  . Smoking status: Former Smoker -- 1.00 packs/day for 42 years    Types: Cigarettes    Quit date: 12/31/2011  . Smokeless tobacco: Never Used  .  Alcohol Use: Yes     Comment: 03/14/2013 "stopped drinking back in 2002; never had problem w/it"    Review of Systems  All other systems reviewed and are negative.     Allergies  Review of patient's allergies indicates no known allergies.  Home Medications   Prior to Admission medications   Medication Sig Start Date End Date Taking? Authorizing Provider  ADVAIR DISKUS 250-50 MCG/DOSE AEPB TAKE 1 INHALATION BY MOUTH TWICE DAILY INTO LUNGS 12/10/13   Orlena Sheldon, PA-C  Blood Glucose Monitoring Suppl (TRUETRACK  BLOOD GLUCOSE) W/DEVICE KIT  07/08/13   Historical Provider, MD  buPROPion (WELLBUTRIN SR) 150 MG 12 hr tablet Take 1 tablet (150 mg total) by mouth 2 (two) times daily. 03/09/14   Lonie Peak Dixon, PA-C  celecoxib (CELEBREX) 200 MG capsule TAKE 1 CAPSULE BY MOUTH EVERY DAY 07/15/14   Susy Frizzle, MD  clopidogrel (PLAVIX) 75 MG tablet Take 1 tablet (75 mg total) by mouth daily. 07/15/14   Susy Frizzle, MD  CRESTOR 20 MG tablet TAKE 1 TABLET BY MOUTH EVERY DAY BEFORE BEDTIME 08/12/14   Orlena Sheldon, PA-C  escitalopram (LEXAPRO) 10 MG tablet  09/02/14   Historical Provider, MD  fish oil-omega-3 fatty acids 1000 MG capsule Take 1 g by mouth daily.      Historical Provider, MD  furosemide (LASIX) 20 MG tablet TAKE 1 TABLET BY MOUTH EVERY DAY 08/12/14   Orlena Sheldon, PA-C  isosorbide mononitrate (IMDUR) 30 MG 24 hr tablet TAKE 1 TABLET BY MOUTH EVERY DAY 05/20/14   Orlena Sheldon, PA-C  lisinopril (PRINIVIL,ZESTRIL) 20 MG tablet TAKE 1 TABLET BY MOUTH EVERY DAY 03/19/14   Orlena Sheldon, PA-C  metoprolol succinate (TOPROL-XL) 50 MG 24 hr tablet TAKE 3 TABLETS (150MG) BY MOUTH TWICE DAILY 06/16/14   Lendon Colonel, NP  nitroGLYCERIN (NITROSTAT) 0.4 MG SL tablet Place 1 tablet (0.4 mg total) under the tongue every 5 (five) minutes as needed for chest pain. 06/17/14   Lonie Peak Dixon, PA-C  pantoprazole (PROTONIX) 40 MG tablet TAKE 1 TABLET BY MOUTH EVERY DAY 12/10/13   Orlena Sheldon, PA-C  sitaGLIPtin (JANUVIA) 50 MG tablet Take 1 tablet (50 mg total) by mouth daily. 10/02/14   Lonie Peak Dixon, PA-C  traZODone (DESYREL) 50 MG tablet Take 1 tablet (50 mg total) by mouth at bedtime. 10/08/14   Orlena Sheldon, PA-C  TRUETRACK TEST test strip  07/08/13   Historical Provider, MD  warfarin (COUMADIN) 5 MG tablet TAKE 1 TABLET (5MG) BY MOUTH ON MONDAY, WEDNESDAY,FRIDAY AND TAKE 1/2 TABLET (2.5MG) BY MOUTH ON SUNDAY, TUESDAY, THURSDAY, AND SATURDAY 07/15/14   Susy Frizzle, MD   BP 154/92 mmHg  Pulse 75  Resp 15  Ht '6\' 1"'  (1.854  m)  Wt 228 lb (103.42 kg)  BMI 30.09 kg/m2  SpO2 97% Physical Exam  Constitutional: He is oriented to person, place, and time. He appears well-developed and well-nourished. No distress.  HENT:  Head: Normocephalic and atraumatic.  Right Ear: External ear normal.  Left Ear: External ear normal.  Eyes: Conjunctivae and EOM are normal. Pupils are equal, round, and reactive to light.  Neck: Normal range of motion and phonation normal. Neck supple.  Cardiovascular: Normal rate, regular rhythm and normal heart sounds.   Initial blood pressure slightly elevated.  Pulmonary/Chest: Effort normal and breath sounds normal. He exhibits no bony tenderness.  Abdominal: Soft. There is no tenderness.  Musculoskeletal: Normal range of  motion.  Neurological: He is alert and oriented to person, place, and time. No cranial nerve deficit or sensory deficit. He exhibits normal muscle tone. Coordination normal.  Skin: Skin is warm, dry and intact.  Psychiatric: He has a normal mood and affect. His behavior is normal. Judgment and thought content normal.  Nursing note and vitals reviewed.   ED Course  Procedures (including critical care time)  Medications  aspirin chewable tablet 324 mg (not administered)    Patient Vitals for the past 24 hrs:  BP Pulse Resp SpO2 Height Weight  10/14/14 1856 154/92 mmHg 75 15 97 % - -  10/14/14 1823 143/90 mmHg 73 13 98 % - -  10/14/14 1741 - - - - '6\' 1"'  (1.854 m) 228 lb (103.42 kg)  10/14/14 1738 134/96 mmHg 74 12 98 % - -  10/14/14 1727 - - - 97 % - -   19:25-case discussed with on-call cardiology will see the patient in ED, and likely admit.  7:35 PM Reevaluation with update and discussion. After initial assessment and treatment, an updated evaluation reveals he remains chest pain-free. Aspirin ordered, will remain on cardiac monitor. Findings discussed with the patient, all questions answered.Daleen Bo L    CRITICAL CARE Performed by: Richarda Blade Total critical care time: 30 min Critical care time was exclusive of separately billable procedures and treating other patients. Critical care was necessary to treat or prevent imminent or life-threatening deterioration. Critical care was time spent personally by me on the following activities: development of treatment plan with patient and/or surrogate as well as nursing, discussions with consultants, evaluation of patient's response to treatment, examination of patient, obtaining history from patient or surrogate, ordering and performing treatments and interventions, ordering and review of laboratory studies, ordering and review of radiographic studies, pulse oximetry and re-evaluation of patient's condition.  Labs Review Labs Reviewed  CBC - Abnormal; Notable for the following:    RBC 4.10 (*)    Hemoglobin 12.9 (*)    HCT 38.0 (*)    Platelets 123 (*)    All other components within normal limits  BASIC METABOLIC PANEL - Abnormal; Notable for the following:    Creatinine, Ser 1.33 (*)    GFR calc non Af Amer 56 (*)    All other components within normal limits  TROPONIN I    Imaging Review Dg Chest 2 View  10/14/2014   CLINICAL DATA:  Chest pain, shortness of breath for 3 days  EXAM: CHEST  2 VIEW  COMPARISON:  05/03/2013  FINDINGS: Cardiomediastinal silhouette is stable. Dual lead cardiac pacemaker is unchanged in position. No acute infiltrate or pleural effusion. No pulmonary edema. Bony thorax is stable.  IMPRESSION: No active cardiopulmonary disease.   Electronically Signed   By: Lahoma Crocker M.D.   On: 10/14/2014 18:34     EKG Interpretation   Date/Time:  Wednesday October 14 2014 17:50:14 EDT Ventricular Rate:  73 PR Interval:  67 QRS Duration: 168 QT Interval:  511 QTC Calculation: 563 R Axis:   -71 Text Interpretation:  Ventricular-paced rhythm No further analysis  attempted due to paced rhythm Since last tracing of earlier today pacer  spikes are now visible Confirmed by  Harshika Mago  MD, Alina Gilkey [phone]) on  10/14/2014 5:56:05 PM      MDM   Final diagnoses:  Acute coronary syndrome    Worsening chest pain consistent with unstable angina. No evidence for myocardial infarction. He'll require admission for further observation and management.  Nursing Notes Reviewed/ Care Coordinated Applicable Imaging Reviewed Interpretation of Laboratory Data incorporated into ED treatment   Plan: Admit  Daleen Bo, MD 10/15/14 [phone]

## 2014-10-14 NOTE — H&P (Signed)
Cardiologist:  [name] Cook is an 64 y.o. male.   Chief Complaint: Chest Pain HPI:   [name] Cook is a 64 year old patient of Dr. Osie Cook, that has a history of paroxysmal atrial fibrillation, which is persistent.  He is status post AV nodal ablation and insertion of dual-chamber placement. He was last seen by Dr. Lovena Cook in January 2015 after undergoing exercise treadmill testing, which demonstrated blunted chronotropic response. His pacemaker was reprogrammed making his rate response, more aggressive. The patient continues on anticoagulation therapy with Coumadin.  he also has a history of hypertension, hyperlipidemia, obesity, tobacco abuse, COPD, coronary artery disease, sleep apnea, diabetes mellitus.  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 This was not favorable for PCI given the aneurysmal segment.   Last echocardiogram was November 2014 and showed an ejection fraction of 45-50%. There was normal wall motion.  Patient presents with chest pain.  patient reports that he's felt weak this entire week. He's having progressively more frequent episodes of sharp chest pain at the worst is 1010. This morning it woke him from sleep and he's taken 5 nitroglycerin throughout the day. He STEMI takes it it relieves the pain very quickly. There's been radiation to the left arm and shortness of breath with diaphoresis. He quit smoking 2 years ago and has had not had any alcohol since 2002 he denies nausea, vomiting, fever, orthopnea, PND, lower extremity edema, abdominal pain, hematochezia, melanotic, hematuria. EKG shows paced rhythm.   Medications: Medication Sig  ADVAIR DISKUS 250-50 MCG/DOSE AEPB TAKE 1 INHALATION BY MOUTH TWICE DAILY INTO LUNGS  Blood Glucose Monitoring Suppl (TRUETRACK BLOOD GLUCOSE) W/DEVICE KIT   buPROPion (WELLBUTRIN SR) 150 MG 12 hr tablet Take 1 tablet (150 mg  total) by mouth 2 (two) times daily.  celecoxib (CELEBREX) 200 MG capsule TAKE 1 CAPSULE BY MOUTH EVERY DAY  clopidogrel (PLAVIX) 75 MG tablet Take 1 tablet (75 mg total) by mouth daily.  CRESTOR 20 MG tablet TAKE 1 TABLET BY MOUTH EVERY DAY BEFORE BEDTIME  escitalopram (LEXAPRO) 10 MG tablet   fish oil-omega-3 fatty acids 1000 MG capsule Take 1 g by mouth daily.    furosemide (LASIX) 20 MG tablet TAKE 1 TABLET BY MOUTH EVERY DAY  isosorbide mononitrate (IMDUR) 30 MG 24 hr tablet TAKE 1 TABLET BY MOUTH EVERY DAY  lisinopril (PRINIVIL,ZESTRIL) 20 MG tablet TAKE 1 TABLET BY MOUTH EVERY DAY  metoprolol succinate (TOPROL-XL) 50 MG 24 hr tablet TAKE 3 TABLETS (150MG) BY MOUTH TWICE DAILY  nitroGLYCERIN (NITROSTAT) 0.4 MG SL tablet Place 1 tablet (0.4 mg total) under the tongue every 5 (five) minutes as needed for chest pain.  pantoprazole (PROTONIX) 40 MG tablet TAKE 1 TABLET BY MOUTH EVERY DAY  sitaGLIPtin (JANUVIA) 50 MG tablet Take 1 tablet (50 mg total) by mouth daily.  traZODone (DESYREL) 50 MG tablet Take 1 tablet (50 mg total) by mouth at bedtime.  TRUETRACK TEST test strip   warfarin (COUMADIN) 5 MG tablet TAKE 1 TABLET (5MG) BY MOUTH ON MONDAY, WEDNESDAY,FRIDAY AND TAKE 1/2 TABLET (2.5MG) BY MOUTH ON SUNDAY, TUESDAY, THURSDAY, AND SATURDAY     Past Medical History  Diagnosis Date  . Persistent atrial fibrillation     recurrent atrial flutter since 2001 s/p DCCVs, multiple failed AADs, h/o tachy-mediated cardiomyopathy  . Hypertension     with hypertensive heart disease  . Hyperlipidemia   .  Atrial flutter     radiofrequency ablation in 2001  . Obesity   . Chronic anticoagulation     chronic Coumadin anticoagulation  . Tobacco abuse   . Chronic obstructive pulmonary disease 04/20/2011  . GERD (gastroesophageal reflux disease)   . CAD (coronary artery disease)     Nonobstructive. Cardiac cath in 2001-50% mid RI, normal LM, LAD, RCA  . Shortness of breath     "can come on at any  time" (03/14/2013)  . Sleep apnea     "dx'd; couldn't wear the mask" (03/14/2013)  . Diabetes mellitus, type 2   . Arthritis     "knees and lower back" (03/14/2013)    Past Surgical History  Procedure Laterality Date  . Atrial flutter ablation  2002    atrial flutter; subsequently developed atrial fibrillation  . Loop recorder implant  2002  . Cardioversion  05/31/2011    Procedure: CARDIOVERSION;  Surgeon: [name] Cook. [name] Haw, [name] Cook;  Location: [facility];  Service: Cardiovascular;  Laterality: N/A;  . Av node ablation  01/24/2013  . Insert / replace / remove pacemaker  01/24/2013     Medtronic Adapta L dual-chamber pacemaker, serial number [id number]   . Carpal tunnel release Left 1980's  . Tibial tuberclerplasty  ~ 2003  . Cardiac catheterization  2002  . Permanent pacemaker insertion N/A 01/24/2013    Procedure: PERMANENT PACEMAKER INSERTION;  Surgeon: [name] Lance, [name] Cook;  Location: [facility] CATH LAB;  Service: Cardiovascular;  Laterality: N/A;  . Left heart catheterization with coronary angiogram N/A 03/17/2013    Procedure: LEFT HEART CATHETERIZATION WITH CORONARY ANGIOGRAM;  Surgeon: [name] Blanks, [name] Cook;  Location: [facility] CATH LAB;  Service: Cardiovascular;  Laterality: N/A;    Family History  Problem Relation Age of Onset  . Alzheimer's disease Mother   . Osteoporosis Mother    Social History:  reports that he quit smoking about 2 years ago. His smoking use included Cigarettes. He has a 42 pack-year smoking history. He has never used smokeless tobacco. He reports that he drinks alcohol. He reports that he does not use illicit drugs.  Allergies: No Known Allergies   (Not in a hospital admission)  Results for orders placed or performed during the hospital encounter of 10/14/14 (from the past 48 hour(s))  CBC     Status: Abnormal   Collection Time: 10/14/14  5:42 PM  Result Value Ref Range   WBC 5.6 4.0 - 10.5 K/uL   RBC 4.10 (L) 4.22 - 5.81 MIL/uL   Hemoglobin 12.9 (L) 13.0 -  17.0 g/dL   HCT 38.0 (L) 39.0 - 52.0 %   MCV 92.7 78.0 - 100.0 fL   MCH 31.5 26.0 - 34.0 pg   MCHC 33.9 30.0 - 36.0 g/dL   RDW 14.0 11.5 - 15.5 %   Platelets 123 (L) 150 - 400 K/uL  Basic metabolic panel     Status: Abnormal   Collection Time: 10/14/14  5:42 PM  Result Value Ref Range   Sodium 138 135 - 145 mmol/L   Potassium 3.8 3.5 - 5.1 mmol/L   Chloride 104 101 - 111 mmol/L   CO2 25 22 - 32 mmol/L   Glucose, Bld 84 65 - 99 mg/dL   BUN 18 6 - 20 mg/dL   Creatinine, Ser 1.33 (H) 0.61 - 1.24 mg/dL   Calcium 8.9 8.9 - 10.3 mg/dL   GFR calc non Af Amer 56 (L) >60 mL/min   GFR calc Af Amer >60 >60 mL/min  Comment: (NOTE) The eGFR has been calculated using the CKD EPI equation. This calculation has not been validated in all clinical situations. eGFR's persistently <60 mL/min signify possible Chronic Kidney Disease.    Anion gap 9 5 - 15  Troponin I     Status: None   Collection Time: 10/14/14  5:42 PM  Result Value Ref Range   Troponin I <0.03 <0.031 ng/mL    Comment:        NO INDICATION OF MYOCARDIAL INJURY.   Protime-INR     Status: Abnormal   Collection Time: 10/14/14  7:51 PM  Result Value Ref Range   Prothrombin Time 31.7 (H) 11.6 - 15.2 seconds   INR 3.15 (H) 0.00 - 1.49   Dg Chest 2 View  10/14/2014   CLINICAL DATA:  Chest pain, shortness of breath for 3 days  EXAM: CHEST  2 VIEW  COMPARISON:  05/03/2013  FINDINGS: Cardiomediastinal silhouette is stable. Dual lead cardiac pacemaker is unchanged in position. No acute infiltrate or pleural effusion. No pulmonary edema. Bony thorax is stable.  IMPRESSION: No active cardiopulmonary disease.   Electronically Signed   By: Lahoma Crocker M.D.   On: 10/14/2014 18:34    Review of Systems  Constitutional: Positive for diaphoresis. Negative for fever.  HENT: Negative for congestion and sore throat.   Respiratory: Positive for shortness of breath. Negative for cough.   Cardiovascular: Positive for chest pain. Negative for  palpitations, orthopnea, leg swelling and PND.  Gastrointestinal: Negative for nausea, vomiting, abdominal pain, blood in stool and melena.  Genitourinary: Negative for hematuria.  Musculoskeletal: Positive for myalgias (Left  Arm).  Neurological: Positive for weakness. Negative for dizziness.  All other systems reviewed and are negative.   Blood pressure 144/96, pulse 78, resp. rate 17, height _0  (1.854 m), weight 228 lb (103.42 kg), SpO2 99 %. Physical Exam  Nursing note and vitals reviewed. Constitutional: He is oriented to person, place, and time. He appears well-developed and well-nourished. No distress.  HENT:  Head: Normocephalic and atraumatic.  Mouth/Throat: No oropharyngeal exudate.  Eyes: EOM are normal. Pupils are equal, round, and reactive to light. No scleral icterus.  Neck: Normal range of motion. Neck supple. No JVD present.  Cardiovascular: Normal rate, regular rhythm and S2 normal.  Exam reveals no gallop and no friction rub.   No murmur heard. Pulses:      Radial pulses are 2+ on the left side.       Dorsalis pedis pulses are 2+ on the right side, and 2+ on the left side.  No carotid bruit  Respiratory: Effort normal and breath sounds normal. He has no wheezes. He has no rales.  GI: Soft. Bowel sounds are normal. He exhibits no distension. There is no tenderness.  Musculoskeletal: He exhibits no edema.  Lymphadenopathy:    He has no cervical adenopathy.  Neurological: He is oriented to person, place, and time. He exhibits normal muscle tone.  Psychiatric: He has a normal mood and affect.     Assessment/Plan Principal Problem:   Unstable angina Active Problems:   Chronic anticoagulation   Atrial fibrillation   Hypertension   Hyperlipidemia   Diabetes mellitus, type 2   Chronic obstructive pulmonary disease   Chronic kidney disease, stage II (mild)   Pacemaker   Long term current use of anticoagulant therapy   Coronary artery disease  The patient  will be admitted to a tele floor as he is pain free.  We will start IV  heparin when INR is less than 2.0.  Continue plavix, toprol xl, crestor.  Start IV NTG.  Hold lisinopril and lasix in anticipation for left heart cath.  Cycle troponin.  Check lipids, TSH, A1C. SS insulin.  Karanveer Ramakrishnan 10/14/2014, 8:22 PM

## 2014-10-14 NOTE — Progress Notes (Signed)
Patient ID: [name] Cook MRN: [id number], DOB: 1951/02/12, 64 y.o. Date of Encounter: '@DATE' @  Chief Complaint:  Chief Complaint  Patient presents with  . Chest pains, HA, Fatigue    Pt states that he has taken 5 nitro tabs over the last 2 days and it helps but then the CP's come right back    HPI: 64 y.o. year old white male  presents with above symptoms.  Says the symptoms started 3 days ago.  First episode occurred while he was up moving around in the house. Developed chest pain and shortness of breath. He took one nitroglycerin and symptoms resolved.  About 1 hour later he was moving around in the house again when he developed same symptoms of chest pain and shortness of breath. Took one nitroglycerin and symptoms resolved.  Yesterday the symptoms were similar.  This morning was woken at 1 AM with chest pain and shortness of breath. Took one nitroglycerin and symptoms resolved. 10 minutes later he was walking to the bathroom and making up his bed.  Symptoms returned. Took one nitroglycerin and symptoms resolved. He then walked down to his basement and up again. Chest pain and shortness of breath reoccurred. Took one nitroglycerin and symptoms resolved.  Since then he has rested and done no exertion. He has had no chest pain or shortness of breath since then.  Says that the "chest pain" that he has been experiencing has been right in the center of his chest and is a pressure.   Past Medical History  Diagnosis Date  . Persistent atrial fibrillation     recurrent atrial flutter since 2001 s/p DCCVs, multiple failed AADs, h/o tachy-mediated cardiomyopathy  . Hypertension     with hypertensive heart disease  . Hyperlipidemia   . Atrial flutter     radiofrequency ablation in 2001  . Obesity   . Chronic anticoagulation     chronic Coumadin anticoagulation  . Tobacco abuse   . Chronic obstructive pulmonary disease 04/20/2011  . GERD (gastroesophageal reflux disease)   . CAD  (coronary artery disease)     Nonobstructive. Cardiac cath in 2001-50% mid RI, normal LM, LAD, RCA  . Shortness of breath     "can come on at any time" (03/14/2013)  . Sleep apnea     "dx'd; couldn't wear the mask" (03/14/2013)  . Diabetes mellitus, type 2   . Arthritis     "knees and lower back" (03/14/2013)     Home Meds: Outpatient Prescriptions Prior to Visit  Medication Sig Dispense Refill  . ADVAIR DISKUS 250-50 MCG/DOSE AEPB TAKE 1 INHALATION BY MOUTH TWICE DAILY INTO LUNGS 60 each 4  . Blood Glucose Monitoring Suppl (TRUETRACK BLOOD GLUCOSE) W/DEVICE KIT     . buPROPion (WELLBUTRIN SR) 150 MG 12 hr tablet Take 1 tablet (150 mg total) by mouth 2 (two) times daily. 60 tablet 2  . celecoxib (CELEBREX) 200 MG capsule TAKE 1 CAPSULE BY MOUTH EVERY DAY 30 capsule 4  . clopidogrel (PLAVIX) 75 MG tablet Take 1 tablet (75 mg total) by mouth daily. 30 tablet 3  . CRESTOR 20 MG tablet TAKE 1 TABLET BY MOUTH EVERY DAY BEFORE BEDTIME 30 tablet 5  . escitalopram (LEXAPRO) 10 MG tablet     . fish oil-omega-3 fatty acids 1000 MG capsule Take 1 g by mouth daily.      . furosemide (LASIX) 20 MG tablet TAKE 1 TABLET BY MOUTH EVERY DAY 30 tablet 5  . isosorbide mononitrate (  IMDUR) 30 MG 24 hr tablet TAKE 1 TABLET BY MOUTH EVERY DAY 30 tablet 3  . lisinopril (PRINIVIL,ZESTRIL) 20 MG tablet TAKE 1 TABLET BY MOUTH EVERY DAY 30 tablet 5  . metoprolol succinate (TOPROL-XL) 50 MG 24 hr tablet TAKE 3 TABLETS (150MG) BY MOUTH TWICE DAILY 180 tablet 3  . nitroGLYCERIN (NITROSTAT) 0.4 MG SL tablet Place 1 tablet (0.4 mg total) under the tongue every 5 (five) minutes as needed for chest pain. 25 tablet 1  . pantoprazole (PROTONIX) 40 MG tablet TAKE 1 TABLET BY MOUTH EVERY DAY 30 tablet 11  . sitaGLIPtin (JANUVIA) 50 MG tablet Take 1 tablet (50 mg total) by mouth daily. 30 tablet 5  . traZODone (DESYREL) 50 MG tablet Take 1 tablet (50 mg total) by mouth at bedtime. 30 tablet 2  . TRUETRACK TEST test strip       . warfarin (COUMADIN) 5 MG tablet TAKE 1 TABLET (5MG) BY MOUTH ON MONDAY, WEDNESDAY,FRIDAY AND TAKE 1/2 TABLET (2.5MG) BY MOUTH ON SUNDAY, TUESDAY, THURSDAY, AND SATURDAY 30 tablet 3   No facility-administered medications prior to visit.    Allergies: No Known Allergies  History   Social History  . Marital Status: Legally Separated    Spouse Name: N/A  . Number of Children: 1  . Years of Education: N/A   Occupational History  . Unemployed   .     Social History Main Topics  . Smoking status: Former Smoker -- 1.00 packs/day for 42 years    Types: Cigarettes    Quit date: 12/31/2011  . Smokeless tobacco: Never Used  . Alcohol Use: Yes     Comment: 03/14/2013 "stopped drinking back in 2002; never had problem w/it"  . Drug Use: No  . Sexual Activity: Not Currently   Other Topics Concern  . 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          Family History  Problem Relation Age of Onset  . Alzheimer's disease Mother   . Osteoporosis Mother      Review of Systems:  See HPI for pertinent ROS. All other ROS negative.    Physical Exam: Blood pressure 110/70, pulse 80, temperature 98.2 F (36.8 C), temperature source Oral, resp. rate 16, height '6\' 1"'  (1.854 m), weight 228 lb (103.42 kg)., Body mass index is 30.09 kg/(m^2). General: WNWD WM. Appears in no acute distress. Neck: Supple. No thyromegaly. No lymphadenopathy. Lungs: Clear bilaterally to auscultation without wheezes, rales, or rhonchi. Breathing is unlabored. Heart: Reg Rhythm. . Musculoskeletal:  Strength and tone normal for age. Extremities/Skin: Warm and dry. Neuro: Alert and oriented X 3. Moves all extremities spontaneously. Gait is normal. CNII-XII grossly in tact. Psych:  Responds to questions appropriately with a normal affect.    EKG --Paced rhythm.  ASSESSMENT AND PLAN:  64 y.o. year old male with  1. Unstable angina 2. Coronary artery disease involving native coronary artery of native heart without angina pectoris 3. Other chest pain - EKG 12-Lead  Cardiac catheterization 03/17/2013 showed LAD: 70%, total occlusion, 80%, 99%, 50% Circumflex: No significant disease RCA: 50% and 50%  Was not amenable to intervention at that time. Was treated medically. Even his known CAD and his current symptoms his symptoms are very concerning for unstable angina. He drove himself here  to the office and has no one to call to drive him to the hospital. EMS called to transport him to [facility]. He left the office in stable condition with EMS.  Marin Olp Velma, [place], [facility] 10/14/2014 5:12 PM

## 2014-10-14 NOTE — ED Notes (Signed)
MD at bedside. 

## 2014-10-14 NOTE — Progress Notes (Signed)
ANTICOAGULATION CONSULT NOTE - Initial Consult  Pharmacy Consult for Heparin Indication: chest pain/ACS  No Known Allergies  Patient Measurements: Height: 6\' 1"  (185.4 cm) Weight: 228 lb (103.42 kg) IBW/kg (Calculated) : 79.9 Heparin Dosing Weight: 101 kg  Vital Signs: Temp: 98.2 F (36.8 C) (06/29 1549) Temp Source: Oral (06/29 1549) BP: 155/91 mmHg (06/29 2030) Pulse Rate: 70 (06/29 2030)  Labs:  Recent Labs  10/14/14 1742 10/14/14 1951  HGB 12.9*  --   HCT 38.0*  --   PLT 123*  --   LABPROT  --  31.7*  INR  --  3.15*  CREATININE 1.33*  --   TROPONINI <0.03  --     Estimated Creatinine Clearance: 72.7 mL/min (by C-G formula based on Cr of 1.33).   Medical History: Past Medical History  Diagnosis Date  . Persistent atrial fibrillation     recurrent atrial flutter since 2001 s/p DCCVs, multiple failed AADs, h/o tachy-mediated cardiomyopathy  . Hypertension     with hypertensive heart disease  . Hyperlipidemia   . Atrial flutter     radiofrequency ablation in 2001  . Obesity   . Chronic anticoagulation     chronic Coumadin anticoagulation  . Tobacco abuse   . Chronic obstructive pulmonary disease 04/20/2011  . GERD (gastroesophageal reflux disease)   . CAD (coronary artery disease)     Nonobstructive. Cardiac cath in 2001-50% mid RI, normal LM, LAD, RCA  . Shortness of breath     "can come on at any time" (03/14/2013)  . Sleep apnea     "dx'd; couldn't wear the mask" (03/14/2013)  . Diabetes mellitus, type 2   . Arthritis     "knees and lower back" (03/14/2013)    Medications:   (Not in a hospital admission) Scheduled:  Infusions:   Assessment: 64yo male with history of PAF on warfarin PTA presents with chest pain. Pharmacy is consulted to dose heparin for ACS/chest pain when INR is < 2.0. Hgb 12.9, Plt 123, sCr 1.33, INR 3.15, Trop neg x1.  PTA warfarin dose: 5mg  MWF and 2.5mg  AODs  Goal of Therapy:  Heparin level 0.3-0.7 units/ml Monitor  platelets by anticoagulation protocol: Yes   Plan:  Start heparin when INR < 2.0 Daily INR Continue to monitor H&H and platelets  Andrey Cota. Diona Foley, PharmD Clinical Pharmacist Pager [phone] 10/14/2014,8:55 PM

## 2014-10-15 LAB — CBC
HCT: 36.5 % — ABNORMAL LOW (ref 39.0–52.0)
Hemoglobin: 12.4 g/dL — ABNORMAL LOW (ref 13.0–17.0)
MCH: 32.1 pg (ref 26.0–34.0)
MCHC: 34 g/dL (ref 30.0–36.0)
MCV: 94.6 fL (ref 78.0–100.0)
Platelets: 118 10*3/uL — ABNORMAL LOW (ref 150–400)
RBC: 3.86 MIL/uL — ABNORMAL LOW (ref 4.22–5.81)
RDW: 14.2 % (ref 11.5–15.5)
WBC: 4.9 10*3/uL (ref 4.0–10.5)

## 2014-10-15 LAB — GLUCOSE, CAPILLARY
Glucose-Capillary: 111 mg/dL — ABNORMAL HIGH (ref 65–99)
Glucose-Capillary: 167 mg/dL — ABNORMAL HIGH (ref 65–99)
Glucose-Capillary: 107 mg/dL — ABNORMAL HIGH (ref 65–99)
Glucose-Capillary: 153 mg/dL — ABNORMAL HIGH (ref 65–99)

## 2014-10-15 LAB — LIPID PANEL
Cholesterol: 116 mg/dL (ref 0–200)
HDL: 35 mg/dL — ABNORMAL LOW (ref >40)
LDL Cholesterol: 59 mg/dL (ref 0–99)
Total CHOL/HDL Ratio: 3.3 RATIO
Triglycerides: 112 mg/dL (ref <150)
VLDL: 22 mg/dL (ref 0–40)

## 2014-10-15 LAB — BASIC METABOLIC PANEL
Anion gap: 4 — ABNORMAL LOW (ref 5–15)
BUN: 19 mg/dL (ref 6–20)
CO2: 27 mmol/L (ref 22–32)
Calcium: 8.6 mg/dL — ABNORMAL LOW (ref 8.9–10.3)
Chloride: 105 mmol/L (ref 101–111)
Creatinine, Ser: 1.36 mg/dL — ABNORMAL HIGH (ref 0.61–1.24)
GFR calc Af Amer: >60 mL/min (ref >60)
GFR calc non Af Amer: 54 mL/min — ABNORMAL LOW (ref >60)
Glucose, Bld: 127 mg/dL — ABNORMAL HIGH (ref 65–99)
Potassium: 3.6 mmol/L (ref 3.5–5.1)
Sodium: 136 mmol/L (ref 135–145)

## 2014-10-15 LAB — TROPONIN I
Troponin I: <0.03 ng/mL (ref <0.031)
Troponin I: <0.03 ng/mL (ref <0.031)

## 2014-10-15 LAB — PROTIME-INR
INR: 3.31 — ABNORMAL HIGH (ref 0.00–1.49)
Prothrombin Time: 33 seconds — ABNORMAL HIGH (ref 11.6–15.2)

## 2014-10-15 MED ORDER — PHYTONADIONE 5 MG PO TABS
5.0000 mg | ORAL_TABLET | Freq: Once | ORAL | Status: AC
  Administered 2014-10-15: 5 mg via ORAL
  Filled 2014-10-15: qty 1

## 2014-10-15 MED ORDER — MORPHINE SULFATE 2 MG/ML IJ SOLN: 2.0000 mg | INTRAMUSCULAR | Status: DC | PRN

## 2014-10-15 MED ORDER — AMLODIPINE BESYLATE 5 MG PO TABS
5.0000 mg | ORAL_TABLET | Freq: Every day | ORAL | Status: DC
  Administered 2014-10-15 – 2014-10-17 (×3): 5 mg via ORAL
  Filled 2014-10-15 (×3): qty 1

## 2014-10-15 NOTE — Progress Notes (Signed)
Patient arrived from ED. He is alert and oriented and is currently not experiencing any chest pains.  Will continue to monitor patient. Lupita Dawn, RN

## 2014-10-15 NOTE — Care Management Note (Signed)
Case Management Note  Patient Details  Name: [name] Cook MRN: [id number] Date of Birth: [birth year]  Subjective/Objective:    Pt was admitted with unstable angina                Action/Plan:  Pt is independent from home with the support of his girlfriend.  CM will continue to monitor for disposition needs   Expected Discharge Date:                  Expected Discharge Plan:  Home/Self Care  In-House Referral:     Discharge planning Services  CM Consult  Post Acute Care Choice:    Choice offered to:     DME Arranged:    DME Agency:     HH Arranged:    HH Agency:     Status of Service:  In process, will continue to follow  Medicare Important Message Given:    Date Medicare IM Given:    Medicare IM give by:    Date Additional Medicare IM Given:    Additional Medicare Important Message give by:     If discussed at [place] of Stay Meetings, dates discussed:    Additional Comments:  Maryclare Labrador, RN 10/15/2014, 3:42 PM

## 2014-10-15 NOTE — Plan of Care (Signed)
Problem: Phase I Progression Outcomes Goal: Anginal pain relieved Outcome: Progressing Pt had 2 episodes of chest pain this morning at 830am and 1045am. At 0830, nitroglycerin infusion was increased from 53mics to 10 mics, with relief of chest pain noted. At 1045, pain was relieved without any interventions. He received his metoprolol and other AM scheduled medications at that time. MD was notified. He has been chest pain free since then. Continue to be monitored.

## 2014-10-15 NOTE — Progress Notes (Signed)
ANTICOAGULATION CONSULT NOTE -  Pharmacy Consult for Heparin Indication: chest pain/ACS  No Known Allergies  Patient Measurements: Height: 6' (182.9 cm) Weight: 227 lb 11.2 oz (103.284 kg) IBW/kg (Calculated) : 77.6 Heparin Dosing Weight: 101 kg  Labs:  Recent Labs  10/14/14 1742 10/14/14 1951 10/14/14 2120 10/15/14 0310  HGB 12.9*  --   --  12.4*  HCT 38.0*  --   --  36.5*  PLT 123*  --   --  118*  LABPROT  --  31.7*  --  33.0*  INR  --  3.15*  --  3.31*  CREATININE 1.33*  --   --  1.36*  TROPONINI <0.03  --  <0.03 <0.03    Estimated Creatinine Clearance: 70 mL/min (by C-G formula based on Cr of 1.36).   Assessment: 64yo male with history of PAF on warfarin PTA presents with chest pain. Pharmacy is consulted to dose heparin for ACS/chest pain when INR is < 2.0  PMH Afib, CKD, COPD, CAD, DM, HLD, HTN, Pacer Coag/heme:  PAF, INR >3  PTA warfarin dose: 5mg  MWF and 2.5mg  AODs  CV:  Cath when INR lower ASA, plavix, metop, crestor, NTG ggt  Endo:  linagliptan  Goal of Therapy:  Heparin level 0.3-0.7 units/ml Monitor platelets by anticoagulation protocol: Yes   Plan:  Start heparin when INR < 2.0 Daily INR Continue to monitor H&H and platelets  Thank you for allowing pharmacy to be a part of this patients care team.  Rowe Robert Pharm.D., BCPS, AQ-Cardiology Clinical Pharmacist 10/15/2014 8:42 AM Pager: [phone] Phone: [phone]

## 2014-10-15 NOTE — Progress Notes (Addendum)
Patient: [name] Cook / Admit Date: 10/14/2014 / Date of Encounter: 10/15/2014, 7:48 AM   Subjective: Denies any further CP on low dose NTG gtt. No SOB. Lying flat. Reports feeling generally weak.   Objective: Telemetry: V paced. Physical Exam: Blood pressure 153/93, pulse 84, temperature 97.6 F (36.4 C), temperature source Oral, resp. rate 18, height 6' (1.829 m), weight 227 lb 11.2 oz (103.284 kg), SpO2 96 %. General: Well developed, well nourished WM, in no acute distress Head: Normocephalic, atraumatic, sclera non-icteric, no xanthomas, nares are without discharge. Neck: Negative for carotid bruits. JVP not elevated. Lungs: Clear bilaterally to auscultation without wheezes, rales, or rhonchi. Breathing is unlabored. Heart: RRR S1 S2 without murmurs, rubs, or gallops.  Abdomen: Soft, non-tender, non-distended with normoactive bowel sounds. No rebound/guarding. Extremities: No clubbing or cyanosis. No edema. Distal pedal pulses are 2+ and equal bilaterally. Neuro: Alert and oriented X 3. Moves all extremities spontaneously. Psych:  Responds to questions appropriately with a normal affect.   Intake/Output Summary (Last 24 hours) at 10/15/14 0748 Last data filed at 10/15/14 0616  Gross per 24 hour  Intake    355 ml  Output   1550 ml  Net  -1195 ml    Inpatient Medications:  . aspirin EC  81 mg Oral Daily  . buPROPion  150 mg Oral BID  . celecoxib  200 mg Oral Daily  . clopidogrel  75 mg Oral Daily  . insulin aspart  0-9 Units Subcutaneous TID WC  . linagliptin  5 mg Oral Daily  . metoprolol succinate  150 mg Oral BID  . omega-3 acid ethyl esters  1 g Oral Daily  . pantoprazole  40 mg Oral Daily  . rosuvastatin  20 mg Oral q1800  . traZODone  50 mg Oral QHS   Infusions:  . nitroGLYCERIN 5 mcg/min (10/14/14 2321)    Labs:  Recent Labs  10/14/14 1742 10/15/14 0310  NA 138 136  K 3.8 3.6  CL 104 105  CO2 25 27  GLUCOSE 84 127*  BUN 18 19  CREATININE 1.33* 1.36*    CALCIUM 8.9 8.6*   No results for input(s): AST, ALT, ALKPHOS, BILITOT, PROT, ALBUMIN in the last 72 hours.  Recent Labs  10/14/14 1742 10/15/14 0310  WBC 5.6 4.9  HGB 12.9* 12.4*  HCT 38.0* 36.5*  MCV 92.7 94.6  PLT 123* 118*    Recent Labs  10/14/14 1742 10/14/14 2120 10/15/14 0310  TROPONINI <0.03 <0.03 <0.03   Invalid input(s): POCBNP No results for input(s): HGBA1C in the last 72 hours.   Radiology/Studies:  Dg Chest 2 View  10/14/2014   CLINICAL DATA:  Chest pain, shortness of breath for 3 days  EXAM: CHEST  2 VIEW  COMPARISON:  05/03/2013  FINDINGS: Cardiomediastinal silhouette is stable. Dual lead cardiac pacemaker is unchanged in position. No acute infiltrate or pleural effusion. No pulmonary edema. Bony thorax is stable.  IMPRESSION: No active cardiopulmonary disease.   Electronically Signed   By: Lahoma Crocker M.D.   On: 10/14/2014 18:34     Assessment and Plan  75M with history of recurrent AF s/p AVN ablation/BiVPPM, CAD, LV dysfunction EF 45-50%, HTN, HLD, DM, obesity Body mass index is 30.87 kg/(m^2)., prior tobacco abuse, sleep apnea presented with chest pain concerning for unstable angina.  1. CAD with chest pain concerning for unstable angina - plan is for cath when INR is lower - today it is 3.3. Will let him eat today. Not sure  what trajectory will be tomorrow but will make NPO after midnight. No further chest pain on low dose NTG gtt.  2. 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 Will hold Celebrex acutely as well and follow. Check echocardiogram to assess EF in order to avoid need for LV gram during cath.  3. HTN - As above, Lasix/lisinopril are on hold due to renal insufficiency which is why BPs are likely a little higher. Will discuss with MD strategy for treatment - he is not on a titrating floor, so we might consider d/c'ing NTG gtt and adding back oral Imdur at increased dose while the prior  meds are on hold.  4. Hyperlipidemia - continue Crestor. LDL at goal.  5. Mild anemia/thrombocytopenia - patient denies bleeding. Follow.  5. Diabetes mellitus - continue Januvia and SSI.   6. H/o persistent AF s/p AVN ablation/BiVPPM - Coumadin on hold. Heparin when INR <2.   Signed, Melina Copa PA-C Pager: [phone]   Patient seen and examined and history reviewed. Agree with above findings and plan. Patient reports some chest pain last night relieved with increase IV Ntg. Now pain free. INR higher 3.3. Took last coumadin 6/29. Will give vitamin K 5 mg today to hopefully reverse INR enough for cath tomorrow. Continue IV Ntg. Lasix and ACEi on hold for cath. On metoprolol. Will add amlodipine for BP control here.   Teng Decou [place], [place] 10/15/2014 9:56 AM

## 2014-10-15 NOTE — Progress Notes (Signed)
Utilization review completed. Yolonda Purtle, RN, BSN. 

## 2014-10-15 NOTE — Progress Notes (Signed)
Patient called and c/o of Chest pain s.s.c.p. sharpe 6/10 on pain scale. See V.S. Flow  Sheet. Skin warm and dry . Resp. Even and unlab. Applied o2 at 3 L/M n.c . R.N. Aware and inc. NTG Drip to 4.5 ml/hr  By Irene Shipper R.N. . E.K.G. Done and Dr. Tommi Rumps page and made aware of the above . 20:30 Patient pain free. See orders Tamera R.N.inc. Drip to 7.5 ml/hr.

## 2014-10-16 ENCOUNTER — Encounter (HOSPITAL_COMMUNITY)
Admission: EM | Disposition: A | Discharge status: Home / Self Care | Payer: Commercial Managed Care - HMO | Source: Home / Self Care | Attending: Internal Medicine

## 2014-10-16 ENCOUNTER — Inpatient Hospital Stay (HOSPITAL_COMMUNITY): Payer: Commercial Managed Care - HMO

## 2014-10-16 ENCOUNTER — Telehealth: Payer: Self-pay | Admitting: *Deleted

## 2014-10-16 DIAGNOSIS — R079 Chest pain, unspecified: Secondary | ICD-10-CM

## 2014-10-16 DIAGNOSIS — I25110 Atherosclerotic heart disease of native coronary artery with unstable angina pectoris: Principal | ICD-10-CM

## 2014-10-16 HISTORY — PX: CARDIAC CATHETERIZATION: SHX172

## 2014-10-16 LAB — BASIC METABOLIC PANEL
Anion gap: 4 — ABNORMAL LOW (ref 5–15)
BUN: 18 mg/dL (ref 6–20)
CO2: 27 mmol/L (ref 22–32)
Calcium: 8.9 mg/dL (ref 8.9–10.3)
Chloride: 107 mmol/L (ref 101–111)
Creatinine, Ser: 1.29 mg/dL — ABNORMAL HIGH (ref 0.61–1.24)
GFR calc Af Amer: >60 mL/min (ref >60)
GFR calc non Af Amer: 58 mL/min — ABNORMAL LOW (ref >60)
Glucose, Bld: 185 mg/dL — ABNORMAL HIGH (ref 65–99)
Potassium: 4 mmol/L (ref 3.5–5.1)
Sodium: 138 mmol/L (ref 135–145)

## 2014-10-16 LAB — CBC
HCT: 37.3 % — ABNORMAL LOW (ref 39.0–52.0)
Hemoglobin: 12.5 g/dL — ABNORMAL LOW (ref 13.0–17.0)
MCH: 31.3 pg (ref 26.0–34.0)
MCHC: 33.5 g/dL (ref 30.0–36.0)
MCV: 93.3 fL (ref 78.0–100.0)
Platelets: 115 10*3/uL — ABNORMAL LOW (ref 150–400)
RBC: 4 MIL/uL — ABNORMAL LOW (ref 4.22–5.81)
RDW: 14.2 % (ref 11.5–15.5)
WBC: 5.3 10*3/uL (ref 4.0–10.5)

## 2014-10-16 LAB — GLUCOSE, CAPILLARY
Glucose-Capillary: 162 mg/dL — ABNORMAL HIGH (ref 65–99)
Glucose-Capillary: 141 mg/dL — ABNORMAL HIGH (ref 65–99)
Glucose-Capillary: 96 mg/dL (ref 65–99)
Glucose-Capillary: 145 mg/dL — ABNORMAL HIGH (ref 65–99)

## 2014-10-16 LAB — HEPARIN LEVEL (UNFRACTIONATED): Heparin Unfractionated: 0.45 IU/mL (ref 0.30–0.70)

## 2014-10-16 LAB — HEMOGLOBIN A1C
Hgb A1c MFr Bld: 5.9 % — ABNORMAL HIGH (ref 4.8–5.6)
Mean Plasma Glucose: 123 mg/dL

## 2014-10-16 LAB — PROTIME-INR
INR: 1.67 — ABNORMAL HIGH (ref 0.00–1.49)
Prothrombin Time: 19.7 seconds — ABNORMAL HIGH (ref 11.6–15.2)

## 2014-10-16 LAB — POCT ACTIVATED CLOTTING TIME: Activated Clotting Time: 294 seconds

## 2014-10-16 SURGERY — LEFT HEART CATH AND CORONARY ANGIOGRAPHY
Anesthesia: LOCAL

## 2014-10-16 MED ORDER — CLOPIDOGREL BISULFATE 300 MG PO TABS
ORAL_TABLET | ORAL | Status: AC
  Filled 2014-10-16: qty 1

## 2014-10-16 MED ORDER — LIDOCAINE HCL (PF) 1 % IJ SOLN
INTRAMUSCULAR | Status: DC | PRN
  Administered 2014-10-16: 3 mL via SUBCUTANEOUS

## 2014-10-16 MED ORDER — ASPIRIN 81 MG PO CHEW
81.0000 mg | CHEWABLE_TABLET | ORAL | Status: AC
  Administered 2014-10-16: 81 mg via ORAL

## 2014-10-16 MED ORDER — WARFARIN SODIUM 5 MG PO TABS
5.0000 mg | ORAL_TABLET | Freq: Once | ORAL | Status: AC
  Administered 2014-10-16: 20:00:00 5 mg via ORAL
  Filled 2014-10-16: qty 1

## 2014-10-16 MED ORDER — FENTANYL CITRATE (PF) 100 MCG/2ML IJ SOLN
INTRAMUSCULAR | Status: AC
  Filled 2014-10-16: qty 2

## 2014-10-16 MED ORDER — HEPARIN (PORCINE) IN NACL 2-0.9 UNIT/ML-% IJ SOLN
INTRAMUSCULAR | Status: AC
  Filled 2014-10-16: qty 1000

## 2014-10-16 MED ORDER — HEPARIN SODIUM (PORCINE) 1000 UNIT/ML IJ SOLN
INTRAMUSCULAR | Status: DC | PRN
  Administered 2014-10-16 (×2): 5000 [IU] via INTRAVENOUS

## 2014-10-16 MED ORDER — NITROGLYCERIN 1 MG/10 ML FOR IR/CATH LAB
INTRA_ARTERIAL | Status: DC | PRN
  Administered 2014-10-16: 200 ug via INTRACORONARY

## 2014-10-16 MED ORDER — WARFARIN - PHARMACIST DOSING INPATIENT
Freq: Every day | Status: DC
  Administered 2014-10-16: 22:00:00

## 2014-10-16 MED ORDER — FENTANYL CITRATE (PF) 100 MCG/2ML IJ SOLN
INTRAMUSCULAR | Status: DC | PRN
  Administered 2014-10-16: 25 ug via INTRAVENOUS

## 2014-10-16 MED ORDER — SODIUM CHLORIDE 0.9 % WEIGHT BASED INFUSION
1.0000 mL/kg/h | INTRAVENOUS | Status: DC
  Administered 2014-10-16: 1 mL/kg/h via INTRAVENOUS

## 2014-10-16 MED ORDER — SODIUM CHLORIDE 0.9 % IV SOLN
INTRAVENOUS | Status: AC
  Administered 2014-10-16: 17:00:00 via INTRAVENOUS

## 2014-10-16 MED ORDER — SODIUM CHLORIDE 0.9 % IJ SOLN: 3.0000 mL | INTRAMUSCULAR | Status: DC | PRN

## 2014-10-16 MED ORDER — MIDAZOLAM HCL 2 MG/2ML IJ SOLN
INTRAMUSCULAR | Status: DC | PRN
  Administered 2014-10-16: 2 mg via INTRAVENOUS

## 2014-10-16 MED ORDER — HEPARIN (PORCINE) IN NACL 100-0.45 UNIT/ML-% IJ SOLN
1500.0000 [IU]/h | INTRAMUSCULAR | Status: DC
  Administered 2014-10-16: 1500 [IU]/h via INTRAVENOUS
  Filled 2014-10-16 (×2): qty 250

## 2014-10-16 MED ORDER — ONDANSETRON HCL 4 MG/2ML IJ SOLN: 4.0000 mg | Freq: Four times a day (QID) | INTRAMUSCULAR | Status: DC | PRN

## 2014-10-16 MED ORDER — SODIUM CHLORIDE 0.9 % WEIGHT BASED INFUSION
3.0000 mL/kg/h | INTRAVENOUS | Status: DC
  Administered 2014-10-16: 3 mL/kg/h via INTRAVENOUS

## 2014-10-16 MED ORDER — SODIUM CHLORIDE 0.9 % IJ SOLN: 3.0000 mL | Freq: Two times a day (BID) | INTRAMUSCULAR | Status: DC

## 2014-10-16 MED ORDER — ASPIRIN EC 81 MG PO TBEC
81.0000 mg | DELAYED_RELEASE_TABLET | Freq: Every day | ORAL | Status: DC
  Administered 2014-10-16 – 2014-10-17 (×2): 81 mg via ORAL
  Filled 2014-10-16: qty 1

## 2014-10-16 MED ORDER — ISOSORBIDE MONONITRATE ER 30 MG PO TB24
30.0000 mg | ORAL_TABLET | Freq: Every day | ORAL | Status: DC
  Administered 2014-10-16 – 2014-10-17 (×2): 30 mg via ORAL
  Filled 2014-10-16 (×2): qty 1

## 2014-10-16 MED ORDER — SODIUM CHLORIDE 0.9 % IV SOLN: 250.0000 mL | INTRAVENOUS | Status: DC | PRN

## 2014-10-16 MED ORDER — IOHEXOL 350 MG/ML SOLN
INTRAVENOUS | Status: DC | PRN
  Administered 2014-10-16: 120 mL via INTRA_ARTERIAL

## 2014-10-16 MED ORDER — NITROGLYCERIN 1 MG/10 ML FOR IR/CATH LAB
INTRA_ARTERIAL | Status: AC
  Filled 2014-10-16: qty 10

## 2014-10-16 MED ORDER — MIDAZOLAM HCL 2 MG/2ML IJ SOLN
INTRAMUSCULAR | Status: AC
  Filled 2014-10-16: qty 2

## 2014-10-16 MED ORDER — VERAPAMIL HCL 2.5 MG/ML IV SOLN
INTRAVENOUS | Status: AC
  Filled 2014-10-16: qty 2

## 2014-10-16 MED ORDER — VERAPAMIL HCL 2.5 MG/ML IV SOLN
INTRAVENOUS | Status: DC | PRN
  Administered 2014-10-16: 16:00:00 via INTRA_ARTERIAL

## 2014-10-16 MED ORDER — LIDOCAINE HCL (PF) 1 % IJ SOLN
INTRAMUSCULAR | Status: AC
  Filled 2014-10-16: qty 30

## 2014-10-16 MED ORDER — CLOPIDOGREL BISULFATE 300 MG PO TABS
ORAL_TABLET | ORAL | Status: DC | PRN
  Administered 2014-10-16: 300 mg

## 2014-10-16 SURGICAL SUPPLY — 20 items
BALLN EMERGE MR 2.5X15 (BALLOONS) ×2
BALLN ~~LOC~~ EMERGE MR 4.0X12 (BALLOONS) ×2
BALLOON EMERGE MR 2.5X15 (BALLOONS) ×1 IMPLANT
BALLOON ~~LOC~~ EMERGE MR 4.0X12 (BALLOONS) ×1 IMPLANT
CATH INFINITI 5 FR JL3.5 (CATHETERS) ×2 IMPLANT
CATH INFINITI 5FR ANG PIGTAIL (CATHETERS) ×2 IMPLANT
CATH INFINITI 5FR JL4 (CATHETERS) ×2 IMPLANT
CATH INFINITI JR4 5F (CATHETERS) ×2 IMPLANT
CATH VISTA GUIDE 6FR JR4 (CATHETERS) ×2 IMPLANT
DEVICE RAD COMP TR BAND LRG (VASCULAR PRODUCTS) ×2 IMPLANT
GLIDESHEATH SLEND SS 6F .021 (SHEATH) ×2 IMPLANT
KIT ENCORE 26 ADVANTAGE (KITS) ×2 IMPLANT
KIT HEART LEFT (KITS) ×2 IMPLANT
PACK CARDIAC CATHETERIZATION (CUSTOM PROCEDURE TRAY) ×2 IMPLANT
STENT SYNERGY DES 4X16 (Permanent Stent) ×2 IMPLANT
SYR MEDRAD MARK V 150ML (SYRINGE) ×2 IMPLANT
TRANSDUCER W/STOPCOCK (MISCELLANEOUS) ×2 IMPLANT
TUBING CIL FLEX 10 FLL-RA (TUBING) ×2 IMPLANT
WIRE COUGAR XT STRL 190CM (WIRE) ×2 IMPLANT
WIRE SAFE-T 1.5MM-J .035X260CM (WIRE) ×2 IMPLANT

## 2014-10-16 NOTE — Care Management (Signed)
Important Message  Patient Details  Name: [name] Cook MRN: [id number] Date of Birth: [name] 13, 1951   Medicare Important Message Given:  Yes-second notification given    Maryclare Labrador, RN 10/16/2014, 3:09 PM

## 2014-10-16 NOTE — Progress Notes (Signed)
  Echocardiogram 2D Echocardiogram has been performed.  Jennette Dubin 10/16/2014, 12:40 PM

## 2014-10-16 NOTE — Progress Notes (Signed)
ANTICOAGULATION CONSULT NOTE - Follow Up Consult  Pharmacy Consult for Coumadin Indication: chest pain/ACS  No Known Allergies  Patient Measurements: Height: 6' (182.9 cm) Weight: 225 lb 15.5 oz (102.5 kg) IBW/kg (Calculated) : 77.6 Heparin Dosing Weight: 101 kg  Vital Signs: Temp: 98.1 F (36.7 C) (07/01 1645) Temp Source: Oral (07/01 1645) BP: 137/86 mmHg (07/01 1645) Pulse Rate: 0 (07/01 1648)  Labs:  Recent Labs  10/14/14 1742 10/14/14 1951 10/14/14 2120 10/15/14 0310 10/15/14 1223 10/16/14 0323 10/16/14 1140  HGB 12.9*  --   --  12.4*  --  12.5*  --   HCT 38.0*  --   --  36.5*  --  37.3*  --   PLT 123*  --   --  118*  --  115*  --   LABPROT  --  31.7*  --  33.0*  --  19.7*  --   INR  --  3.15*  --  3.31*  --  1.67*  --   HEPARINUNFRC  --   --   --   --   --   --  0.45  CREATININE 1.33*  --   --  1.36*  --  1.29*  --   TROPONINI <0.03  --  <0.03 <0.03 <0.03  --   --     Estimated Creatinine Clearance: 73.6 mL/min (by C-G formula based on Cr of 1.29).  Assessment: 64 yo male with history of PAF on warfarin PTA presents with chest pain. Pharmacy is consulted to dose heparin for ACS/chest pain when INR is < 2.0. INR down to 1.67 this morning after vit K 5mg  given yesterday. Last HL is therapeutic at 0.45. CBC remains stable.  Now s/p cath, pharmacy asked to resume Coumadin.  PTA Coumadin dose 5 mg on Mon, Wed, Fri, and 2.5 mg on all other days.  Goal of Therapy:  Heparin level 0.3-0.7 units/ml Monitor platelets by anticoagulation protocol: Yes   Plan:  Coumadin 5 mg po x 1 tonight. Monitor daily INR, CBC, s/s of bleed  Uvaldo Rising, BCPS  Clinical Pharmacist Pager [phone]  10/16/2014 4:59 PM

## 2014-10-16 NOTE — Telephone Encounter (Signed)
Received fax from [facility] management Department stating that pt has been admitted to acute care hospital  Hospital: [facility] Date:10/14/14  Dx:I24.9-acute Ischemic heart disease,unspecified  Admitting physician:Gregg Taylor,MD  Pending authorization:[id number]

## 2014-10-16 NOTE — H&P (View-Only) (Signed)
Patient: [name] Cook / Admit Date: 10/14/2014 / Date of Encounter: 10/16/2014, 8:04 AM   Subjective: Some recurrent CP last night. No CP or SOB today. He is glad he doesn't have to wait until Monday for cath.   Objective: Telemetry: V paced Physical Exam: Blood pressure 130/86, pulse 87, temperature 97.4 F (36.3 C), temperature source Oral, resp. rate 18, height 6' (1.829 m), weight 225 lb 15.5 oz (102.5 kg), SpO2 99 %. General: Well developed, well nourished WM, in no acute distress Head: Normocephalic, atraumatic, sclera non-icteric, no xanthomas, nares are without discharge. Neck: Negative for carotid bruits. JVP not elevated. Lungs: Clear bilaterally to auscultation without wheezes, rales, or rhonchi. Breathing is unlabored. Heart: RRR S1 S2 without murmurs, rubs, or gallops.  Abdomen: Soft, non-tender, non-distended with normoactive bowel sounds. No rebound/guarding. Extremities: No clubbing or cyanosis. No edema. Distal pedal pulses are 2+ and equal bilaterally. Neuro: Alert and oriented X 3. Moves all extremities spontaneously. Psych: Responds to questions appropriately with a normal affect.   Intake/Output Summary (Last 24 hours) at 10/16/14 0804 Last data filed at 10/16/14 0532  Gross per 24 hour  Intake   1320 ml  Output   3550 ml  Net  -2230 ml    Inpatient Medications:  . amLODipine  5 mg Oral Daily  . aspirin  81 mg Oral Pre-Cath  . [START ON 10/17/2014] aspirin EC  81 mg Oral Daily  . buPROPion  150 mg Oral BID  . clopidogrel  75 mg Oral Daily  . insulin aspart  0-9 Units Subcutaneous TID WC  . linagliptin  5 mg Oral Daily  . metoprolol succinate  150 mg Oral BID  . omega-3 acid ethyl esters  1 g Oral Daily  . pantoprazole  40 mg Oral Daily  . rosuvastatin  20 mg Oral q1800  . traZODone  50 mg Oral QHS   Infusions:  . sodium chloride     Followed by  . sodium chloride    . heparin 1,500 Units/hr (10/16/14 3086)  . nitroGLYCERIN 25 mcg/min (10/15/14 2052)     Labs:  Recent Labs  10/15/14 0310 10/16/14 0323  NA 136 138  K 3.6 4.0  CL 105 107  CO2 27 27  GLUCOSE 127* 185*  BUN 19 18  CREATININE 1.36* 1.29*  CALCIUM 8.6* 8.9   No results for input(s): AST, ALT, ALKPHOS, BILITOT, PROT, ALBUMIN in the last 72 hours.  Recent Labs  10/15/14 0310 10/16/14 0323  WBC 4.9 5.3  HGB 12.4* 12.5*  HCT 36.5* 37.3*  MCV 94.6 93.3  PLT 118* 115*    Recent Labs  10/14/14 1742 10/14/14 2120 10/15/14 0310 10/15/14 1223  TROPONINI <0.03 <0.03 <0.03 <0.03   Invalid input(s): POCBNP  Recent Labs  10/14/14 2120  HGBA1C 5.9*     Radiology/Studies:  Dg Chest 2 View  10/14/2014   CLINICAL DATA:  Chest pain, shortness of breath for 3 days  EXAM: CHEST  2 VIEW  COMPARISON:  05/03/2013  FINDINGS: Cardiomediastinal silhouette is stable. Dual lead cardiac pacemaker is unchanged in position. No acute infiltrate or pleural effusion. No pulmonary edema. Bony thorax is stable.  IMPRESSION: No active cardiopulmonary disease.   Electronically Signed   By: Lahoma Crocker M.D.   On: 10/14/2014 18:34     Assessment and Plan  15M with history of recurrent AF s/p AVN ablation/BiVPPM, CAD, LV dysfunction EF 45-50%, HTN, HLD, DM, obesity Body mass index is 30.87 kg/(m^2)., prior tobacco abuse, sleep  apnea presented with chest pain concerning for unstable angina.  1. CAD with chest pain concerning for unstable angina - some chest pain overnight, but chest pain free right now. Plan cath today as INR is 1.67. Risks and benefits of cardiac catheterization have been discussed with the patient.  These include bleeding, infection, kidney damage, stroke, heart attack, death.  The patient understands these risks and is willing to proceed. If he needs PCI will need to factor in decision about triple anticoag therapy given concomitant coumadin use.  2. 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 Will hold  Celebrex acutely as well and follow. Check echocardiogram to assess EF in order to avoid need for LV gram during cath. Cr stable today.  3. HTN - As above, Lasix/lisinopril are on hold due to renal insufficiency which is why BPs were likely a little higher. Amlodipine added yesterday.  4. Hyperlipidemia - continue Crestor. LDL at goal.  5. Mild anemia/thrombocytopenia - patient denies bleeding. Follow.  5. Diabetes mellitus - continue Januvia and SSI.   6. H/o persistent AF s/p AVN ablation/BiVPPM - Coumadin on hold.  On heparin for INR <2.  Signed, Melina Copa PA-C Pager: [phone]   Patient seen and examined and history reviewed. Agree with above findings and plan. Patient had some recurrent chest pain last night but feels well today. BP looks good. INR down to 1.67 so we are clear to proceed with cardiac cath. Hydrating for CKD and lasix, ACEi and NSAIDs on hold. If PCI needed will need to take into account need for long term coumadin.  Peter Martinique, [place] 10/16/2014 9:28 AM

## 2014-10-16 NOTE — Progress Notes (Signed)
ANTICOAGULATION CONSULT NOTE -  Pharmacy Consult for Heparin Indication: chest pain/ACS  No Known Allergies  Patient Measurements: Height: 6' (182.9 cm) Weight: 225 lb 15.5 oz (102.5 kg) IBW/kg (Calculated) : 77.6 Heparin Dosing Weight: 101 kg  Labs:  Recent Labs  10/14/14 1742 10/14/14 1951 10/14/14 2120 10/15/14 0310 10/15/14 1223 10/16/14 0323  HGB 12.9*  --   --  12.4*  --  12.5*  HCT 38.0*  --   --  36.5*  --  37.3*  PLT 123*  --   --  118*  --  115*  LABPROT  --  31.7*  --  33.0*  --  19.7*  INR  --  3.15*  --  3.31*  --  1.67*  CREATININE 1.33*  --   --  1.36*  --  1.29*  TROPONINI <0.03  --  <0.03 <0.03 <0.03  --     Estimated Creatinine Clearance: 73.6 mL/min (by C-G formula based on Cr of 1.29).   Assessment: 64yo male with history of PAF on warfarin PTA presents with chest pain. Pharmacy is consulted to dose heparin for ACS/chest pain when INR is < 2.0. INR down to 1.67 this morning after vit K 5mg  given yesterday. CBC remains stable.  PTA warfarin dose: 5mg  MWF and 2.5mg  AODs  Goal of Therapy:  Heparin level 0.3-0.7 units/ml Monitor platelets by anticoagulation protocol: Yes   Plan:  Heparin gtt at 1500 units/hr. No bolus. Will f/u 6 hr heparin level Daily heparin level, CBC, and INR  Thank you for allowing pharmacy to be a part of this patients care team.  Sherlon Handing, PharmD, BCPS Clinical pharmacist, pager [phone] 10/16/2014 5:45 AM

## 2014-10-16 NOTE — Progress Notes (Signed)
Patient: [name] Cook / Admit Date: 10/14/2014 / Date of Encounter: 10/16/2014, 8:04 AM   Subjective: Some recurrent CP last night. No CP or SOB today. He is glad he doesn't have to wait until Monday for cath.   Objective: Telemetry: V paced Physical Exam: Blood pressure 130/86, pulse 87, temperature 97.4 F (36.3 C), temperature source Oral, resp. rate 18, height 6' (1.829 m), weight 225 lb 15.5 oz (102.5 kg), SpO2 99 %. General: Well developed, well nourished WM, in no acute distress Head: Normocephalic, atraumatic, sclera non-icteric, no xanthomas, nares are without discharge. Neck: Negative for carotid bruits. JVP not elevated. Lungs: Clear bilaterally to auscultation without wheezes, rales, or rhonchi. Breathing is unlabored. Heart: RRR S1 S2 without murmurs, rubs, or gallops.  Abdomen: Soft, non-tender, non-distended with normoactive bowel sounds. No rebound/guarding. Extremities: No clubbing or cyanosis. No edema. Distal pedal pulses are 2+ and equal bilaterally. Neuro: Alert and oriented X 3. Moves all extremities spontaneously. Psych: Responds to questions appropriately with a normal affect.   Intake/Output Summary (Last 24 hours) at 10/16/14 0804 Last data filed at 10/16/14 0532  Gross per 24 hour  Intake   1320 ml  Output   3550 ml  Net  -2230 ml    Inpatient Medications:  . amLODipine  5 mg Oral Daily  . aspirin  81 mg Oral Pre-Cath  . [START ON 10/17/2014] aspirin EC  81 mg Oral Daily  . buPROPion  150 mg Oral BID  . clopidogrel  75 mg Oral Daily  . insulin aspart  0-9 Units Subcutaneous TID WC  . linagliptin  5 mg Oral Daily  . metoprolol succinate  150 mg Oral BID  . omega-3 acid ethyl esters  1 g Oral Daily  . pantoprazole  40 mg Oral Daily  . rosuvastatin  20 mg Oral q1800  . traZODone  50 mg Oral QHS   Infusions:  . sodium chloride     Followed by  . sodium chloride    . heparin 1,500 Units/hr (10/16/14 3532)  . nitroGLYCERIN 25 mcg/min (10/15/14 2052)     Labs:  Recent Labs  10/15/14 0310 10/16/14 0323  NA 136 138  K 3.6 4.0  CL 105 107  CO2 27 27  GLUCOSE 127* 185*  BUN 19 18  CREATININE 1.36* 1.29*  CALCIUM 8.6* 8.9   No results for input(s): AST, ALT, ALKPHOS, BILITOT, PROT, ALBUMIN in the last 72 hours.  Recent Labs  10/15/14 0310 10/16/14 0323  WBC 4.9 5.3  HGB 12.4* 12.5*  HCT 36.5* 37.3*  MCV 94.6 93.3  PLT 118* 115*    Recent Labs  10/14/14 1742 10/14/14 2120 10/15/14 0310 10/15/14 1223  TROPONINI <0.03 <0.03 <0.03 <0.03   Invalid input(s): POCBNP  Recent Labs  10/14/14 2120  HGBA1C 5.9*     Radiology/Studies:  Dg Chest 2 View  10/14/2014   CLINICAL DATA:  Chest pain, shortness of breath for 3 days  EXAM: CHEST  2 VIEW  COMPARISON:  05/03/2013  FINDINGS: Cardiomediastinal silhouette is stable. Dual lead cardiac pacemaker is unchanged in position. No acute infiltrate or pleural effusion. No pulmonary edema. Bony thorax is stable.  IMPRESSION: No active cardiopulmonary disease.   Electronically Signed   By: [name] Cook M.D.   On: 10/14/2014 18:34     Assessment and Plan  [name] with history of recurrent AF s/p AVN ablation/BiVPPM, CAD, LV dysfunction EF 45-50%, HTN, HLD, DM, obesity Body mass index is 30.87 kg/(m^2)., prior tobacco abuse, sleep  apnea presented with chest pain concerning for unstable angina.  1. CAD with chest pain concerning for unstable angina - some chest pain overnight, but chest pain free right now. Plan cath today as INR is 1.67. Risks and benefits of cardiac catheterization have been discussed with the patient.  These include bleeding, infection, kidney damage, stroke, heart attack, death.  The patient understands these risks and is willing to proceed. If he needs PCI will need to factor in decision about triple anticoag therapy given concomitant coumadin use.  2. 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 Will hold  Celebrex acutely as well and follow. Check echocardiogram to assess EF in order to avoid need for LV gram during cath. Cr stable today.  3. HTN - As above, Lasix/lisinopril are on hold due to renal insufficiency which is why BPs were likely a little higher. Amlodipine added yesterday.  4. Hyperlipidemia - continue Crestor. LDL at goal.  5. Mild anemia/thrombocytopenia - patient denies bleeding. Follow.  5. Diabetes mellitus - continue Januvia and SSI.   6. H/o persistent AF s/p AVN ablation/BiVPPM - Coumadin on hold.  On heparin for INR <2.  Signed, [name] Copa PA-C Pager: [phone]   Patient seen and examined and history reviewed. Agree with above findings and plan. Patient had some recurrent chest pain last night but feels well today. BP looks good. INR down to 1.67 so we are clear to proceed with cardiac cath. Hydrating for CKD and lasix, ACEi and NSAIDs on hold. If PCI needed will need to take into account need for long term coumadin.  [name] Cook, [place] 10/16/2014 9:28 AM

## 2014-10-16 NOTE — Interval H&P Note (Signed)
History and Physical Interval Note:  10/16/2014 3:05 PM  [name] Cook  has presented today for surgery, with the diagnosis of cp  The various methods of treatment have been discussed with the patient and family. After consideration of risks, benefits and other options for treatment, the patient has consented to  Procedure(s): Left Heart Cath and Coronary Angiography (N/A) as a surgical intervention .  The patient's history has been reviewed, patient examined, no change in status, stable for surgery.  I have reviewed the patient's chart and labs.  Questions were answered to the patient's satisfaction.    Cath Lab Visit (complete for each Cath Lab visit)  Clinical Evaluation Leading to the Procedure:   ACS: Yes.    Non-ACS:    Anginal Classification: CCS IV  Anti-ischemic medical therapy: Maximal Therapy (2 or more classes of medications)  Non-Invasive Test Results: No non-invasive testing performed  Prior CABG: No previous CABG       [name]

## 2014-10-16 NOTE — Progress Notes (Signed)
ANTICOAGULATION CONSULT NOTE - Follow Up Consult  Pharmacy Consult for Heparin Indication: chest pain/ACS  No Known Allergies  Patient Measurements: Height: 6' (182.9 cm) Weight: 225 lb 15.5 oz (102.5 kg) IBW/kg (Calculated) : 77.6 Heparin Dosing Weight: 101 kg  Vital Signs: Temp: 97.4 F (36.3 C) (07/01 0531) Temp Source: Oral (07/01 0531) BP: 130/86 mmHg (07/01 0531) Pulse Rate: 87 (07/01 0531)  Labs:  Recent Labs  10/14/14 1742 10/14/14 1951 10/14/14 2120 10/15/14 0310 10/15/14 1223 10/16/14 0323 10/16/14 1140  HGB 12.9*  --   --  12.4*  --  12.5*  --   HCT 38.0*  --   --  36.5*  --  37.3*  --   PLT 123*  --   --  118*  --  115*  --   LABPROT  --  31.7*  --  33.0*  --  19.7*  --   INR  --  3.15*  --  3.31*  --  1.67*  --   HEPARINUNFRC  --   --   --   --   --   --  0.45  CREATININE 1.33*  --   --  1.36*  --  1.29*  --   TROPONINI <0.03  --  <0.03 <0.03 <0.03  --   --     Estimated Creatinine Clearance: 73.6 mL/min (by C-G formula based on Cr of 1.29).  Assessment: 64yo male with history of PAF on warfarin PTA presents with chest pain. Pharmacy is consulted to dose heparin for ACS/chest pain when INR is < 2.0. INR down to 1.67 this morning after vit K 5mg  given yesterday. Last HL is therapeutic at 0.45. CBC remains stable. Will go to cath later today.  Goal of Therapy:  Heparin level 0.3-0.7 units/ml Monitor platelets by anticoagulation protocol: Yes   Plan:  Continue heparin gtt at 1,500 units/hr until cath Monitor daily HL, CBC, s/s of bleed F/U post cath   [facility] J 10/16/2014,12:38 PM

## 2014-10-16 NOTE — Progress Notes (Signed)
TR BAND REMOVAL  LOCATION:    right radial  DEFLATED PER PROTOCOL:    Yes.    TIME BAND OFF / DRESSING APPLIED:    20:52   SITE UPON ARRIVAL:    Level 0  SITE AFTER BAND REMOVAL:    Level 0  REVERSE ALLEN'S TEST:     positive  CIRCULATION SENSATION AND MOVEMENT:    Within Normal Limits   Yes.    COMMENTS:   Pt tolerated removal of TR band without complications, will continue to monitor patient,

## 2014-10-17 ENCOUNTER — Encounter (HOSPITAL_COMMUNITY): Payer: Self-pay | Admitting: Physician Assistant

## 2014-10-17 DIAGNOSIS — I249 Acute ischemic heart disease, unspecified: Secondary | ICD-10-CM

## 2014-10-17 LAB — BASIC METABOLIC PANEL
Anion gap: 7 (ref 5–15)
BUN: 14 mg/dL (ref 6–20)
CO2: 24 mmol/L (ref 22–32)
Calcium: 8.7 mg/dL — ABNORMAL LOW (ref 8.9–10.3)
Chloride: 106 mmol/L (ref 101–111)
Creatinine, Ser: 1.29 mg/dL — ABNORMAL HIGH (ref 0.61–1.24)
GFR calc Af Amer: >60 mL/min (ref >60)
GFR calc non Af Amer: 58 mL/min — ABNORMAL LOW (ref >60)
Glucose, Bld: 111 mg/dL — ABNORMAL HIGH (ref 65–99)
Potassium: 3.8 mmol/L (ref 3.5–5.1)
Sodium: 137 mmol/L (ref 135–145)

## 2014-10-17 LAB — CBC
HCT: 36.2 % — ABNORMAL LOW (ref 39.0–52.0)
Hemoglobin: 12 g/dL — ABNORMAL LOW (ref 13.0–17.0)
MCH: 31.1 pg (ref 26.0–34.0)
MCHC: 33.1 g/dL (ref 30.0–36.0)
MCV: 93.8 fL (ref 78.0–100.0)
Platelets: 110 10*3/uL — ABNORMAL LOW (ref 150–400)
RBC: 3.86 MIL/uL — ABNORMAL LOW (ref 4.22–5.81)
RDW: 14.2 % (ref 11.5–15.5)
WBC: 5.5 10*3/uL (ref 4.0–10.5)

## 2014-10-17 LAB — PROTIME-INR
INR: 1.25 (ref 0.00–1.49)
Prothrombin Time: 15.8 seconds — ABNORMAL HIGH (ref 11.6–15.2)

## 2014-10-17 LAB — GLUCOSE, CAPILLARY: Glucose-Capillary: 116 mg/dL — ABNORMAL HIGH (ref 65–99)

## 2014-10-17 MED ORDER — ASPIRIN 81 MG PO TBEC: 81.0000 mg | DELAYED_RELEASE_TABLET | Freq: Every day | ORAL | Status: DC

## 2014-10-17 MED ORDER — WARFARIN SODIUM 7.5 MG PO TABS
7.5000 mg | ORAL_TABLET | Freq: Once | ORAL | Status: DC
  Filled 2014-10-17: qty 1

## 2014-10-17 MED ORDER — AMLODIPINE BESYLATE 5 MG PO TABS: 5.0000 mg | ORAL_TABLET | Freq: Every day | ORAL | Status: DC

## 2014-10-17 MED ORDER — CLOPIDOGREL BISULFATE 75 MG PO TABS: 75.0000 mg | ORAL_TABLET | Freq: Every day | ORAL | Status: DC

## 2014-10-17 NOTE — Progress Notes (Signed)
ANTICOAGULATION CONSULT NOTE - Follow Up Consult  Pharmacy Consult for Coumadin Indication: chest pain/ACS  No Known Allergies  Patient Measurements: Height: 6' (182.9 cm) Weight: 226 lb 6.6 oz (102.7 kg) IBW/kg (Calculated) : 77.6 Heparin Dosing Weight: 101 kg  Vital Signs: Temp: 97.5 F (36.4 C) (07/02 0802) Temp Source: Oral (07/02 0802) BP: 145/79 mmHg (07/02 0802) Pulse Rate: 70 (07/02 0802)  Labs:  Recent Labs  10/14/14 2120 10/15/14 0310 10/15/14 1223 10/16/14 0323 10/16/14 1140 10/17/14 0324  HGB  --  12.4*  --  12.5*  --  12.0*  HCT  --  36.5*  --  37.3*  --  36.2*  PLT  --  118*  --  115*  --  110*  LABPROT  --  33.0*  --  19.7*  --  15.8*  INR  --  3.31*  --  1.67*  --  1.25  HEPARINUNFRC  --   --   --   --  0.45  --   CREATININE  --  1.36*  --  1.29*  --  1.29*  TROPONINI <0.03 <0.03 <0.03  --   --   --     Estimated Creatinine Clearance: 73.6 mL/min (by C-G formula based on Cr of 1.29).  Assessment: 64 yo male with history of PAF on warfarin PTA presents with chest pain.   Now s/p cath, pharmacy asked to resume Coumadin 7/1. INR this am is down to 1.25 as somewhat expected after receiving Vitamin K. Will give extra dose tonight. CBC appears stable, no bleeding or hematoma noted.  PTA Coumadin dose 5 mg on Mon, Wed, Fri, and 2.5 mg on all other days.  Goal of Therapy:  Heparin level 0.3-0.7 units/ml Monitor platelets by anticoagulation protocol: Yes   Plan:  Coumadin 7.5 mg po x 1 tonight. Monitor daily INR, CBC, s/s of bleed  Discharge regimen recommended: 7.5mg  tonight 7/2, 5mg  7/3 then resume home dose of warfarin   Erin Hearing PharmD., BCPS Clinical Pharmacist Pager [phone] 10/17/2014 8:18 AM

## 2014-10-17 NOTE — Progress Notes (Signed)
CARDIAC REHAB PHASE I   PRE:  Rate/Rhythm: 88 paced  BP:  Sitting: 145/49        SaO2: 96 RA  MODE:  Ambulation: 1000 ft   POST:  Rate/Rhythm: 111 paced, 86 after 2 minutes rest  BP:  Sitting: 178/101, 161/91 after 3 minutes rest         SaO2: 99 RA  Pt ambulated 1000 ft on RA, independent, steady gait, tolerated well.  Pt deneis DOE, cp, dizziness, declined rest stop. Pt BP elevated following ambulation, improved with rest, RN notified. Completed stent education.  Reviewed anti-platelet therapy, stent card, activity restrictions, ntg, exercise, heart healthy diet, carb counting, phase 2 cardiac rehab. Pt verbalized understanding. Pt declines phase 2 cardiac rehab,at this time, states he did it in the past and was unable to afford the copay. Pt states he has seen a dietician in [place] in the past, and may be interested in following up with her outpatient. Pt to recliner after walk, call bell within reach.  [phone]  Lenna Sciara, RN, BSN 10/17/2014 10:06 AM

## 2014-10-17 NOTE — Discharge Instructions (Signed)
Angina Pectoris Angina pectoris is extreme discomfort in your chest, neck, or arm. Your doctor may call it just angina. It is caused by a lack of oxygen to your heart wall. It may feel like tightness or heavy pressure. It may feel like a crushing or squeezing pain. Some people say it feels like gas. It may go down your shoulders, back, and arms. Some people have symptoms other than pain. These include:  Tiredness.  Shortness of breath.  Cold sweats.  Feeling sick to your stomach (nausea). There are four types of angina:  Stable angina. This type often lasts the same amount of time each time it happens. Activity, stress, or excitement can bring it on. It often gets better after taking a medicine called nitroglycerin. This goes under your tongue.  Unstable angina. This type can happen when you are not active or even during sleep. It can suddenly get worse or happen more often. It may not get better after taking the special medicine. It can last up to 30 minutes.  Microvascular angina. This type is more common in women. It may be more severe or last longer than other types.  Prinzmetal angina. This type often happens when you are not active or in the early morning hours. HOME CARE   Only take medicines as told by your doctor.  Stay active or exercise more as told by your doctor.  Limit very hard activity as told by your doctor.  Limit heavy lifting as told by your doctor.  Keep a healthy weight.  Learn about and eat foods that are healthy for your heart.  Do not use any tobacco such as cigarettes, chewing tobacco, or e-cigarettes. GET HELP RIGHT AWAY IF:   You have chest, neck, deep shoulder, or arm pain or discomfort that lasts more than a few minutes.  You have chest, neck, deep shoulder, or arm pain or discomfort that goes away and comes back over and over again.  You have heavy sweating that seems to happen for no reason.  You have shortness of breath or trouble  breathing.  Your angina does not get better after a few minutes of rest.  Your angina does not get better after you take nitroglycerin medicine. These can all be symptoms of a heart attack. Get help right away. Call your local emergency service (911 in [place] Do not  drive yourself to the hospital. Do not  wait to for your symptoms to go away. MAKE SURE YOU:   Understand these instructions.  Will watch your condition.  Will get help right away if you are not doing well or get worse. Document Released: 09/20/2007 Document Revised: 04/08/2013 Document Reviewed: 08/05/2013 [facility] Patient Information 2015 [place], [place]. This information is not intended to replace advice given to you by your health care provider. Make sure you discuss any questions you have with your health care provider.  No driving for 24 hours. No lifting over 5 lbs for 1 week. No sexual activity for 1 week. Keep procedure site clean & dry. If you notice increased pain, swelling, bleeding or pus, call/return!  You may shower, but no soaking baths/hot tubs/pools for 1 week.   Hold Lisinopril for today, you can resume Lisinopril on 10/18/2014

## 2014-10-17 NOTE — Progress Notes (Signed)
Patient Name: [name] Cook Date of Encounter: 10/17/2014  Primary Cardiologist: [name]   Principal Problem:   Unstable angina Active Problems:   Chronic anticoagulation   Atrial fibrillation   Hypertension   Hyperlipidemia   Diabetes mellitus, type 2   Chronic obstructive pulmonary disease   Chronic kidney disease, stage II (mild)   Pacemaker   Long term current use of anticoagulant therapy   Coronary artery disease    SUBJECTIVE  Denies any CP or SOB.   CURRENT MEDS . amLODipine  5 mg Oral Daily  . aspirin EC  81 mg Oral Daily  . buPROPion  150 mg Oral BID  . clopidogrel  75 mg Oral Daily  . insulin aspart  0-9 Units Subcutaneous TID WC  . isosorbide mononitrate  30 mg Oral Daily  . linagliptin  5 mg Oral Daily  . metoprolol succinate  150 mg Oral BID  . omega-3 acid ethyl esters  1 g Oral Daily  . pantoprazole  40 mg Oral Daily  . rosuvastatin  20 mg Oral q1800  . sodium chloride  3 mL Intravenous Q12H  . traZODone  50 mg Oral QHS  . Warfarin - Pharmacist Dosing Inpatient   Does not apply q1800    OBJECTIVE  Filed Vitals:   10/16/14 2021 10/17/14 0026 10/17/14 0402 10/17/14 0420  BP: 143/85 150/88 171/97 154/92  Pulse: 74 73 97   Temp: 97.8 F (36.6 C) 97.5 F (36.4 C) 97.4 F (36.3 C)   TempSrc: Oral Oral Oral   Resp: 22 21 21    Height:      Weight:  226 lb 6.6 oz (102.7 kg)    SpO2: 98% 96% 95%     Intake/Output Summary (Last 24 hours) at 10/17/14 0759 Last data filed at 10/17/14 0405  Gross per 24 hour  Intake    660 ml  Output   1825 ml  Net  -1165 ml   Filed Weights   10/14/14 2214 10/16/14 0531 10/17/14 0026  Weight: 227 lb 11.2 oz (103.284 kg) 225 lb 15.5 oz (102.5 kg) 226 lb 6.6 oz (102.7 kg)    PHYSICAL EXAM  General: Pleasant, NAD. Neuro: Alert and oriented X 3. Moves all extremities spontaneously. Psych: Normal affect. HEENT:  Normal  Neck: Supple without bruits or JVD. Lungs:  Resp regular and unlabored, CTA. Heart: RRR  no s3, s4, or murmurs. R radial cath stable.  Abdomen: Soft, non-tender, non-distended, BS + x 4.  Extremities: No clubbing, cyanosis or edema. DP/PT/Radials 2+ and equal bilaterally.  Accessory Clinical Findings  CBC  Recent Labs  10/16/14 0323 10/17/14 0324  WBC 5.3 5.5  HGB 12.5* 12.0*  HCT 37.3* 36.2*  MCV 93.3 93.8  PLT 115* 073*   Basic Metabolic Panel  Recent Labs  10/16/14 0323 10/17/14 0324  NA 138 137  K 4.0 3.8  CL 107 106  CO2 27 24  GLUCOSE 185* 111*  BUN 18 14  CREATININE 1.29* 1.29*  CALCIUM 8.9 8.7*   Cardiac Enzymes  Recent Labs  10/14/14 2120 10/15/14 0310 10/15/14 1223  TROPONINI <0.03 <0.03 <0.03   Hemoglobin A1C  Recent Labs  10/14/14 2120  HGBA1C 5.9*   Fasting Lipid Panel  Recent Labs  10/15/14 0310  CHOL 116  HDL 35*  LDLCALC 59  TRIG 112  CHOLHDL 3.3   Thyroid Function Tests  Recent Labs  10/14/14 2120  TSH 2.007    TELE ventricularly paced rhythm    ECG  A-fib with ventricularly paced rhythm  Echocardiogram  LV EF: 35% -  40%  ------------------------------------------------------------------- Indications:   Chest pain 786.51.  ------------------------------------------------------------------- History:  PMH:  Dyspnea. Atrial fibrillation. Atrial flutter. Coronary artery disease. Chronic obstructive pulmonary disease. Risk factors: Current tobacco use. Hypertension. Diabetes mellitus. Dyslipidemia.  ------------------------------------------------------------------- Study Conclusions  - Left ventricle: The cavity size was normal. Wall thickness was increased in a pattern of moderate LVH. Anterior wall and peri-apical moderate-severe hypokinesis. Systolic function was moderately reduced. The estimated ejection fraction was in the range of 35% to 40%. Indeterminant diastolic function (atrial fibrillation). - Aortic valve: There was no stenosis. - Mitral valve: There was  trivial regurgitation. - Left atrium: The atrium was mildly dilated. - Right ventricle: The cavity size was normal. Pacer wire or catheter noted in right ventricle. Systolic function was normal. - Right atrium: The atrium was mildly dilated. - Pulmonary arteries: No complete TR doppler jet so unable to estimate PA systolic pressure. - Inferior vena cava: The vessel was normal in size. The respirophasic diameter changes were in the normal range (>= 50%), consistent with normal central venous pressure.  Impressions:  - Technically difficult study with poor acoustic windows. Echo contrast may have helped but was not used. Normal LV size with moderate LV hypertrophy. EF 35-40%. Moderate to severe hypokinesis of the anterior wall and peri-apically. Normal RV size and systolic function. No significant valvular abnormalities.    Radiology/Studies  Dg Chest 2 View  10/14/2014   CLINICAL DATA:  Chest pain, shortness of breath for 3 days  EXAM: CHEST  2 VIEW  COMPARISON:  05/03/2013  FINDINGS: Cardiomediastinal silhouette is stable. Dual lead cardiac pacemaker is unchanged in position. No acute infiltrate or pleural effusion. No pulmonary edema. Bony thorax is stable.  IMPRESSION: No active cardiopulmonary disease.   Electronically Signed   By: Lahoma Crocker M.D.   On: 10/14/2014 18:34    ASSESSMENT AND PLAN  52M with history of recurrent AF s/p AVN ablation/BiVPPM, CAD, LV dysfunction EF 45-50%, HTN, HLD, DM, obesity Body mass index is 30.87 kg/(m^2)., prior tobacco abuse, sleep apnea presented with chest pain concerning for unstable angina.  1. CAD with chest pain concerning for unstable angina   - cath 10/16/2014 95% mid RCA treated with Synergy DES, 99% D1 stenosis with medical therapy recommendation, nonobstructive LAD and LCx stenosis, EF 40-45%  - continue ASA, plavix and coumadin for 1 month, then d/c ASA   - restart lisinopril and celebrax today and d/c amlodipine which  was added during this admission for HTN while holding lisinopril  - EF 35-40% on echo 7/1  - stable for discharge today  2. 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 Will hold Celebrex acutely as well and follow.   3. HTN - As above, Lasix/lisinopril are on hold due to renal insufficiency which is why BPs were likely a little higher. Amlodipine added yesterday.  4. Hyperlipidemia - continue Crestor. LDL at goal.  5. Mild anemia/thrombocytopenia - patient denies bleeding. Follow.  5. Diabetes mellitus - continue Januvia and SSI.   6. H/o persistent AF s/p AVN ablation/BiVPPM - Coumadin on hold. On heparin for INR <2.  Signed, Almyra Deforest PA-C Pager: [phone]

## 2014-10-17 NOTE — Discharge Summary (Signed)
Discharge Summary   Patient ID: [name] Cook,  MRN: [id number], DOB/AGE: 1951-03-19 64 y.o.  Admit date: 10/14/2014 Discharge date: 10/17/2014  Primary Care Provider: Dena Billet BETH Primary Cardiologist: [name]  Discharge Diagnoses Principal Problem:   Unstable angina Active Problems:   Chronic anticoagulation   Atrial fibrillation   Hypertension   Hyperlipidemia   Diabetes mellitus, type 2   Chronic obstructive pulmonary disease   Chronic kidney disease, stage II (mild)   Pacemaker   Long term current use of anticoagulant therapy   Coronary artery disease   Acute coronary syndrome   Allergies No Known Allergies  Procedures  Echocardiogram 10/16/2014 LV EF: 35% -  40%  ------------------------------------------------------------------- Indications:   Chest pain 786.51.  ------------------------------------------------------------------- History:  PMH:  Dyspnea. Atrial fibrillation. Atrial flutter. Coronary artery disease. Chronic obstructive pulmonary disease. Risk factors: Current tobacco use. Hypertension. Diabetes mellitus. Dyslipidemia.  ------------------------------------------------------------------- Study Conclusions  - Left ventricle: The cavity size was normal. Wall thickness was increased in a pattern of moderate LVH. Anterior wall and peri-apical moderate-severe hypokinesis. Systolic function was moderately reduced. The estimated ejection fraction was in the range of 35% to 40%. Indeterminant diastolic function (atrial fibrillation). - Aortic valve: There was no stenosis. - Mitral valve: There was trivial regurgitation. - Left atrium: The atrium was mildly dilated. - Right ventricle: The cavity size was normal. Pacer wire or catheter noted in right ventricle. Systolic function was normal. - Right atrium: The atrium was mildly dilated. - Pulmonary arteries: No complete TR doppler jet so unable to estimate PA systolic  pressure. - Inferior vena cava: The vessel was normal in size. The respirophasic diameter changes were in the normal range (>= 50%), consistent with normal central venous pressure.  Impressions:  - Technically difficult study with poor acoustic windows. Echo contrast may have helped but was not used. Normal LV size with moderate LV hypertrophy. EF 35-40%. Moderate to severe hypokinesis of the anterior wall and peri-apically. Normal RV size and systolic function. No significant valvular abnormalities.     Cardiac catheterization 10/16/2014 Conclusion    1. There is moderate left ventricular systolic dysfunction. 2. Lat 1st Diag lesion, 100% stenosed. 3. Ost 1st Diag to 1st Diag lesion, 99% stenosed. 4. Mid RCA-1 lesion, 30% stenosed. 5. Mid RCA-2 lesion, 95% stenosed. There is a 0% residual stenosis post intervention. 6. A drug-eluting stent was placed.  FINAL CONCLUSIONS:  SEVERE RCA STENOSIS TREATED SUCCESSFULLY WITH PCI USING A SYNERGY DES  SEVERE DIAGONAL STENOSIS, UNCHANGED FROM PREVIOUS STUDY, WITH MEDICAL THERAPY RECOMMENDED  NONOBSTRUCTIVE LAD AND LCX STENOSIS  MODERATE LV SYSTOLIC DYSFUNCTION  RECOMMENDATIONS:  ASA 81 MG, PLAVIX 75 MG, AND WARFARIN X ONE MONTH, THEN DC ASA  SHOULD BE STABLE FOR DISCHARGE TOMORROW AND INR CAN TREND UP AFTER DISCHARGE. WARFARIN RESTARTED [place] [facility]  The patient is a 64 year old male with PMH of persistent atrial fibrillation s/p AV nodal ablation and PPM placement, HTN, HLD, DM, CAD, and CKD stage II-III. he has been taking Coumadin chronically given persistent atrial fibrillation. 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 Echocardiogram in November 2014 showed ejection fraction of 45-50%. He presented to [facility] on 10/14/2014 with chest  pain and fatigue for the week. The symptom was relieved with nitroglycerin. His symptom was concerning for unstable angina. His Coumadin was held and the plan  was made to undergo cardiac catheterization once INR was less than 2. His Lasix and lisinopril was also held in anticipation for cardiac cath and contrast dye.  Patient underwent planned cardiac catheterization on 10/16/2014 which showed a 5% mid RCA stenosis treated with Synergy DES, 99% D1 stenosis which was similar on previous cath treated with medical therapy, nonobstructive LAD and the left circumflex stenosis, EF 40-45%. Echo was obtained on the same day which showed EF 30 about 35-40%. Amlodipine was added during this admission to help with blood pressure.  He was seen in the morning of 10/17/2014, at which time he denies any chest discomfort or shortness breath. His right radial cath site appears to be stable without significant bleeding or hematoma. He is deemed stable for discharge from cardiology perspective. Of note, post-cath, patient was placed on aspirin and Plavix. We have resumed Coumadin without bridge. He will be on triple therapy with plan of discontinuing aspirin after one month. I will arrange close outpatient follow-up, however given his history of CAD, this patient should be established with a general cardiologist instead of follow-up with [name] alone. Given his renal insufficiency, we will continue to hold lisinopril and restart the medication on 10/18/2014. His blood pressure medication should be further up titrated if his renal function remained stable.   Discharge Vitals Blood pressure 145/79, pulse 70, temperature 97.5 F (36.4 C), temperature source Oral, resp. rate 18, height 6' (1.829 m), weight 226 lb 6.6 oz (102.7 kg), SpO2 96 %.  Filed Weights   10/14/14 2214 10/16/14 0531 10/17/14 0026  Weight: 227 lb 11.2 oz (103.284 kg) 225 lb 15.5 oz (102.5 kg) 226 lb 6.6 oz (102.7 kg)    Labs  CBC  Recent Labs   10/16/14 0323 10/17/14 0324  WBC 5.3 5.5  HGB 12.5* 12.0*  HCT 37.3* 36.2*  MCV 93.3 93.8  PLT 115* 588*   Basic Metabolic Panel  Recent Labs  10/16/14 0323 10/17/14 0324  NA 138 137  K 4.0 3.8  CL 107 106  CO2 27 24  GLUCOSE 185* 111*  BUN 18 14  CREATININE 1.29* 1.29*  CALCIUM 8.9 8.7*   Cardiac Enzymes  Recent Labs  10/14/14 2120 10/15/14 0310 10/15/14 1223  TROPONINI <0.03 <0.03 <0.03   Hemoglobin A1C  Recent Labs  10/14/14 2120  HGBA1C 5.9*   Fasting Lipid Panel  Recent Labs  10/15/14 0310  CHOL 116  HDL 35*  LDLCALC 59  TRIG 112  CHOLHDL 3.3   Thyroid Function Tests  Recent Labs  10/14/14 2120  TSH 2.007    Disposition  Pt is being discharged home today in good condition.  Follow-up Plans & Appointments      Follow-up Information    Follow up with [name], MD.   Specialty:  Cardiology   Why:  Office will contact you to arrange followup, please give [place] a call if you do not hear from [place]   Contact information:   [address] [address] [phone]       Discharge Medications    Medication List    TAKE these medications        ADVAIR DISKUS 250-50 MCG/DOSE Aepb  Generic drug:  Fluticasone-Salmeterol  TAKE 1 INHALATION BY MOUTH TWICE DAILY INTO LUNGS     amLODipine 5 MG tablet  Commonly known as:  NORVASC  Take 1 tablet (5 mg total) by mouth daily.     aspirin 81 MG EC tablet  Take 1 tablet (81 mg total) by mouth daily. For 1 month     buPROPion 150 MG 12 hr tablet  Commonly known as:  WELLBUTRIN SR  Take 1 tablet (150 mg total) by mouth 2 (two) times daily.     celecoxib 200 MG capsule  Commonly known as:  CELEBREX  TAKE 1 CAPSULE BY MOUTH EVERY DAY     clopidogrel 75 MG tablet  Commonly known as:  PLAVIX  Take 1 tablet (75 mg total) by mouth daily.     CRESTOR 20 MG tablet  Generic drug:  rosuvastatin  TAKE 1 TABLET BY MOUTH EVERY DAY BEFORE BEDTIME     escitalopram  10 MG tablet  Commonly known as:  LEXAPRO  Take 10 mg by mouth daily.     fish oil-omega-3 fatty acids 1000 MG capsule  Take 1 g by mouth daily.     furosemide 20 MG tablet  Commonly known as:  LASIX  TAKE 1 TABLET BY MOUTH EVERY DAY     isosorbide mononitrate 30 MG 24 hr tablet  Commonly known as:  IMDUR  TAKE 1 TABLET BY MOUTH EVERY DAY     lisinopril 20 MG tablet  Commonly known as:  PRINIVIL,ZESTRIL  TAKE 1 TABLET BY MOUTH EVERY DAY     metoprolol succinate 50 MG 24 hr tablet  Commonly known as:  TOPROL-XL  TAKE 3 TABLETS (150MG ) BY MOUTH TWICE DAILY     nitroGLYCERIN 0.4 MG SL tablet  Commonly known as:  NITROSTAT  Place 1 tablet (0.4 mg total) under the tongue every 5 (five) minutes as needed for chest pain.     pantoprazole 40 MG tablet  Commonly known as:  PROTONIX  TAKE 1 TABLET BY MOUTH EVERY DAY     sitaGLIPtin 50 MG tablet  Commonly known as:  JANUVIA  Take 1 tablet (50 mg total) by mouth daily.     traZODone 50 MG tablet  Commonly known as:  DESYREL  Take 1 tablet (50 mg total) by mouth at bedtime.     warfarin 5 MG tablet  Commonly known as:  COUMADIN  TAKE 1 TABLET (5MG ) BY MOUTH ON MONDAY, WEDNESDAY,FRIDAY AND TAKE 1/2 TABLET (2.5MG ) BY MOUTH ON SUNDAY, TUESDAY, THURSDAY, AND SATURDAY         Duration of Discharge Encounter   Greater than 30 minutes including physician time.  Hilbert Corrigan PA-C Pager: [phone] 10/17/2014, 9:48 AM

## 2014-10-20 ENCOUNTER — Encounter (HOSPITAL_COMMUNITY): Payer: Self-pay | Admitting: Cardiovascular Disease

## 2014-10-20 ENCOUNTER — Telehealth: Payer: Self-pay | Admitting: *Deleted

## 2014-10-20 NOTE — Telephone Encounter (Signed)
Received fax from [facility] from [facility]  Date of admit:10/14/14  Date of discharge:10/17/14  Level of Care:inpatient Acute Medical care  Discharging facility:[facility]  Attending physician:Gregg Taylor,MD  ZUD:[id number] Pickard,MD  Discharge Dispostion:Discharged to home,self care  DX:I24.9-Acute ischemic heart disease,unspecified  Follow up instructions: Follow up with cardiologist, Salome Spotted Office will contact you to arrange follow up, please give [place] a call if you do not hear from [place]

## 2014-10-21 MED FILL — Heparin Sodium (Porcine) 2 Unit/ML in Sodium Chloride 0.9%: INTRAMUSCULAR | Qty: 1000 | Status: AC

## 2014-10-26 ENCOUNTER — Telehealth: Payer: Self-pay | Admitting: *Deleted

## 2014-10-26 NOTE — Telephone Encounter (Signed)
Submitted [employer] referral thru acuity connect for authorization to Dr. Salome Spotted with authorization number [id number]  Requesting provider: Flonnie Hailstone  Treating provider: Salome Spotted  Number of visits:6  Start Date:11/03/14  End Date:05/02/15  Dx:I20.0-unstable angina      I25.110- Athscl heart disease of native cor art w unstable ang pctrs      Z95.0-Presence of cardiac pacemaker     I48.1-Persistent atrial fibrillation  Copy has been faxed to [facility] for review

## 2014-10-29 ENCOUNTER — Ambulatory Visit: Payer: Commercial Managed Care - HMO | Admitting: Physician Assistant

## 2014-11-03 ENCOUNTER — Encounter: Payer: Self-pay | Admitting: Physician Assistant

## 2014-11-03 ENCOUNTER — Ambulatory Visit (INDEPENDENT_AMBULATORY_CARE_PROVIDER_SITE_OTHER): Payer: Commercial Managed Care - HMO | Admitting: Physician Assistant

## 2014-11-03 VITALS — BP 110/72 | HR 77 | Ht 72.0 in | Wt 229.0 lb

## 2014-11-03 DIAGNOSIS — I4819 Other persistent atrial fibrillation: Secondary | ICD-10-CM

## 2014-11-03 DIAGNOSIS — I481 Persistent atrial fibrillation: Secondary | ICD-10-CM | POA: Diagnosis not present

## 2014-11-03 DIAGNOSIS — Z950 Presence of cardiac pacemaker: Secondary | ICD-10-CM | POA: Diagnosis not present

## 2014-11-03 NOTE — Progress Notes (Signed)
Cardiology Office Note   Date:  11/03/2014   ID:  [name] Cook, DOB Oct 10, 1950, MRN [id number]  PCP:  Karis Juba, PA-C  Cardiologist:  Dr Brayton Mars, PA-C   No chief complaint on file.   History of Present Illness: [name] Cook is a 64 y.o. 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  [name] Cook presents for followup.   He has been doing very well since d/c from the hospital. His SOB and chest pain have resolved. He is able to increase his activity and the heat does not bother him like it did. He has no bleeding issues on triple therapy and knows he is to stop the ASA after a month. He is having no edema, orthopnea or PND. He feels he is doing very well.   He has not had regular f/u of his PPM. Has not recently seen Dr [name].   Past Medical History  Diagnosis Date  . Persistent atrial fibrillation     recurrent atrial flutter since 2001 s/p DCCVs, multiple failed AADs, h/o tachy-mediated cardiomyopathy  . Hypertension     with hypertensive heart disease  . Hyperlipidemia   . Atrial flutter     radiofrequency ablation in 2001  . Obesity   . Chronic anticoagulation     chronic Coumadin anticoagulation  . Tobacco abuse   . Chronic obstructive pulmonary disease 04/20/2011  . GERD (gastroesophageal reflux disease)   . CAD (coronary artery disease)     a. Nonobstructive. Cardiac cath in 2001-50% mid RI, normal LM, LAD, RCA b. cath 10/16/2014 95% mid RCA treated with DES, 99% ost D1 medical management due to small aneurysmal segment  . Shortness of breath     "can come on at any time" (03/14/2013)  . Sleep apnea     "dx'd; couldn't wear the mask" (03/14/2013)  . Diabetes mellitus, type 2   . Arthritis     "knees and lower back" (03/14/2013)    Past Surgical History  Procedure Laterality Date  . Atrial flutter ablation   2002    atrial flutter; subsequently developed atrial fibrillation  . Loop recorder implant  2002  . Cardioversion  05/31/2011    Procedure: CARDIOVERSION;  Surgeon: Cristopher Estimable. Lattie Haw, MD;  Location: [facility];  Service: Cardiovascular;  Laterality: N/A;  . Av node ablation  01/24/2013  . Insert / replace / remove pacemaker  01/24/2013     Medtronic Adapta L dual-chamber pacemaker, serial number NWE [id number]   . Carpal tunnel release Left 1980's  . Tibial tuberclerplasty  ~ 2003  . Cardiac catheterization  2002  . Permanent pacemaker insertion N/A 01/24/2013    Procedure: PERMANENT PACEMAKER INSERTION;  Surgeon: [name], MD;  Location: [facility] C[facility];  Service: Cardiovascular;  Laterality: N/A;  . Left heart catheterization with coronary angiogram N/A 03/17/2013    Procedure: LEFT HEART CATHETERIZATION WITH CORONARY ANGIOGRAM;  Surgeon: Burnell Blanks, MD; LAD mild dz, D1 branch 100%, inferior branch 99%, CFX OK, RCA 50%, EF 65%    . Cardiac catheterization N/A 10/16/2014    Procedure: Left Heart Cath and Coronary Angiography;  Surgeon: [name], MD;  Location: [place] CV [facility];  Service: Cardiovascular;  Laterality: N/A;    Current Outpatient Prescriptions  Medication Sig Dispense Refill  . ADVAIR DISKUS 250-50  MCG/DOSE AEPB TAKE 1 INHALATION BY MOUTH TWICE DAILY INTO LUNGS 60 each 4  . amLODipine (NORVASC) 5 MG tablet Take 1 tablet (5 mg total) by mouth daily. 30 tablet 5  . aspirin EC 81 MG EC tablet Take 1 tablet (81 mg total) by mouth daily. For 1 month    . buPROPion (WELLBUTRIN SR) 150 MG 12 hr tablet Take 1 tablet (150 mg total) by mouth 2 (two) times daily. 60 tablet 2  . celecoxib (CELEBREX) 200 MG capsule TAKE 1 CAPSULE BY MOUTH EVERY DAY 30 capsule 4  . clopidogrel (PLAVIX) 75 MG tablet Take 1 tablet (75 mg total) by mouth daily. 90 tablet 3  . CRESTOR 20 MG tablet TAKE 1 TABLET BY MOUTH EVERY DAY BEFORE BEDTIME 30 tablet 5  . escitalopram (LEXAPRO) 10 MG tablet  Take 10 mg by mouth daily.     . fish oil-omega-3 fatty acids 1000 MG capsule Take 1 g by mouth daily.      . furosemide (LASIX) 20 MG tablet TAKE 1 TABLET BY MOUTH EVERY DAY 30 tablet 5  . isosorbide mononitrate (IMDUR) 30 MG 24 hr tablet TAKE 1 TABLET BY MOUTH EVERY DAY 30 tablet 3  . JANUVIA 100 MG tablet Take 100 mg by mouth daily.     Marland Kitchen lisinopril (PRINIVIL,ZESTRIL) 20 MG tablet TAKE 1 TABLET BY MOUTH EVERY DAY 30 tablet 5  . metFORMIN (GLUCOPHAGE) 500 MG tablet Take 500 mg by mouth daily with breakfast.     . metoprolol succinate (TOPROL-XL) 50 MG 24 hr tablet TAKE 3 TABLETS (150MG ) BY MOUTH TWICE DAILY 180 tablet 3  . nitroGLYCERIN (NITROSTAT) 0.4 MG SL tablet Place 1 tablet (0.4 mg total) under the tongue every 5 (five) minutes as needed for chest pain. 25 tablet 1  . pantoprazole (PROTONIX) 40 MG tablet TAKE 1 TABLET BY MOUTH EVERY DAY 30 tablet 11  . sitaGLIPtin (JANUVIA) 50 MG tablet Take 1 tablet (50 mg total) by mouth daily. 30 tablet 5  . warfarin (COUMADIN) 5 MG tablet TAKE 1 TABLET (5MG ) BY MOUTH ON MONDAY, WEDNESDAY,FRIDAY AND TAKE 1/2 TABLET (2.5MG ) BY MOUTH ON SUNDAY, TUESDAY, THURSDAY, AND SATURDAY 30 tablet 3   No current facility-administered medications for this visit.    Allergies:   Review of patient's allergies indicates no known allergies.    Social History:  The patient  reports that he quit smoking about 2 years ago. His smoking use included Cigarettes. He has a 42 pack-year smoking history. He has never used smokeless tobacco. He reports that he drinks alcohol. He reports that he does not use illicit drugs.   Family History:  The patient's family history includes Alzheimer's disease in his mother; Osteoporosis in his mother.    ROS:  Please see the history of present illness. All other systems are reviewed and negative.    PHYSICAL EXAM: VS:  BP 110/72 mmHg  Pulse 77  Ht 6' (1.829 m)  Wt 229 lb (103.874 kg)  BMI 31.05 kg/m2 , BMI Body mass index is 31.05  kg/(m^2). GEN: Well nourished, well developed, in no acute distress HEENT: normal Neck: no JVD, carotid bruits, or masses Cardiac: RRR; no murmurs, rubs, or gallops,no edema  Respiratory:  clear to auscultation bilaterally, normal work of breathing GI: soft, nontender, nondistended, + BS MS: no deformity or atrophy, R radial cath site healed in well Skin: warm and dry, no rash Neuro:  Strength and sensation are intact Psych: euthymic mood, full affect   EKG:  EKG is ordered today. The ekg ordered today demonstrates atrial fib, V pacing, no change.   Recent Labs: 10/01/2014: ALT 13 10/14/2014: TSH 2.007 10/17/2014: BUN 14; Creatinine, Ser 1.29*; Hemoglobin 12.0*; Platelets 110*; Potassium 3.8; Sodium 137    Lipid Panel    Component Value Date/Time   CHOL 116 10/15/2014 0310   CHOL 125 01/23/2013 0151   TRIG 112 10/15/2014 0310   TRIG 208* 01/23/2013 0151   TRIG 289 05/05/2008   HDL 35* 10/15/2014 0310   HDL 33* 01/23/2013 0151   CHOLHDL 3.3 10/15/2014 0310   VLDL 22 10/15/2014 0310   VLDL 42* 01/23/2013 0151   LDLCALC 59 10/15/2014 0310   LDLCALC 50 01/23/2013 0151   LDLCALC 119 05/05/2008     Wt Readings from Last 3 Encounters:  11/03/14 229 lb (103.874 kg)  10/17/14 226 lb 6.6 oz (102.7 kg)  10/14/14 228 lb (103.42 kg)     Other studies Reviewed: Additional studies/ records that were reviewed today include: Recent admission records.  ASSESSMENT AND PLAN:  1.  [facility]: pt sx improved w/ PCI of his RCA. He is tolerating his medications well. He is aware to stop the ASA after 30 days. VS are stable. No other concerns. Continue current plan and f/u in 6 months in [place].  2. Atrial fib, w/ AVN ablation and PPM: no recent PPM checks. Will arrange.  Current medicines are reviewed at length with the patient today.  The patient does not have concerns regarding medicines.  The following changes have been made:  no change  Labs/ tests ordered today include:   Orders  Placed This Encounter  Procedures  . EKG 12-Lead     Disposition:   FU with Dr [name] in 6 months.  Augusto Garbe  11/03/2014 11:20 AM    [facility] [address], [place], [place]  [address] Phone: [phone]; Fax: [phone]

## 2014-11-03 NOTE — Patient Instructions (Signed)
Medication Instructions:  Your physician recommends that you continue on your current medications as directed. Please refer to the Current Medication list given to you today.   Labwork: NONE  Testing/Procedures: NONE  Follow-Up: Your physician recommends that you schedule a follow-up appointment in: 3 to 6 months with [name].   Any Other Special Instructions Will Be Listed Below (If Applicable).

## 2014-11-16 ENCOUNTER — Other Ambulatory Visit: Payer: Self-pay | Admitting: Physician Assistant

## 2014-11-16 NOTE — Telephone Encounter (Signed)
Medication filled x1 with no refills.   Requires office visit before any further refills can be given.   Letter sent.  

## 2014-11-19 ENCOUNTER — Encounter: Payer: Self-pay | Admitting: Physician Assistant

## 2014-11-19 ENCOUNTER — Ambulatory Visit (INDEPENDENT_AMBULATORY_CARE_PROVIDER_SITE_OTHER): Payer: Commercial Managed Care - HMO | Admitting: Physician Assistant

## 2014-11-19 VITALS — BP 130/74 | HR 76 | Temp 98.3°F | Resp 18 | Ht 72.0 in | Wt 225.0 lb

## 2014-11-19 DIAGNOSIS — Z7901 Long term (current) use of anticoagulants: Secondary | ICD-10-CM | POA: Diagnosis not present

## 2014-11-19 DIAGNOSIS — I4891 Unspecified atrial fibrillation: Secondary | ICD-10-CM | POA: Diagnosis not present

## 2014-11-19 DIAGNOSIS — I4819 Other persistent atrial fibrillation: Secondary | ICD-10-CM

## 2014-11-19 DIAGNOSIS — I481 Persistent atrial fibrillation: Secondary | ICD-10-CM | POA: Diagnosis not present

## 2014-11-19 LAB — PT WITH INR/FINGERSTICK
INR, fingerstick: 1.8 — ABNORMAL HIGH (ref 0.80–1.20)
PT, fingerstick: 21.4 seconds — ABNORMAL HIGH (ref 10.4–12.5)

## 2014-11-19 MED ORDER — METOPROLOL SUCCINATE ER 50 MG PO TB24: ORAL_TABLET | ORAL | Status: DC

## 2014-11-19 MED ORDER — ZOLPIDEM TARTRATE 10 MG PO TABS: 10.0000 mg | ORAL_TABLET | Freq: Every evening | ORAL | Status: DC | PRN

## 2014-11-19 MED ORDER — SITAGLIPTIN PHOSPHATE 50 MG PO TABS: 50.0000 mg | ORAL_TABLET | Freq: Every day | ORAL | Status: DC

## 2014-11-19 NOTE — Progress Notes (Signed)
D   Patient ID: [name] Cook MRN: [id number], DOB: November 20, 1950, 64 y.o. Date of Encounter: @DATE @  Chief Complaint:  Chief Complaint  Patient presents with  . PT/ INR    currently taking Coumadin 5mg  MWF/ 2.5mg  TTSS  . Medication Review    states that he can;t take treazodone- makes him feel weird    HPI: 64 y.o. year old white male  presents for followup office visit. Here to check his PT/INR.   He had his first office visit with me on 06/19/2013.   He had been seen in our office in the past by [name].  ----First visit with him was 01/05/2011.  ----He only saw him one other visit and that  was 04/19/2011.  Since then, patient has been seeing [facility].  [facility].  He had been having his PT INR checked at [facility].  However he wanted to start having it checked at our office.  He presented here on 06/19/13 for PT/INR check and also to re- establish primary care here.  He is on Coumadin for history of atrial fibrillation/atrial flutter. Goal INR is 2-3. He is taking 2.5 mg on all days except for 5 mg on Mondays /  Wednesdays /  Fridays. No skipped doses.  No bleeding.  No significant change in dietary intake of Vitamin K containing foods.  No change in medications.  He is taking his diabetic medications as directed. He checks his blood sugar about 3 times per week at different times of day and always gets good readings but never gets low readings--never anything lower than 80.  At [place] 04/13/14--he said he was having diarrhea for months. No abdominal pain--just diarrhea.  IDecreased Metformin--At [place] 06/22/14 pt says diarrhea resolved since decreasing the dose of Metformin.  He is taking blood pressure medications as directed. No lightheadedness. No lower extremity edema.  He is taking his Crestor as directed. No myalgias or other adverse effects.   At [place] 03/09/2014 he says that he did want to  discuss one thing. Says that he has been on medications for depression in the past but then he was without insurance for a while so he had gone off of those medications. He is feeling that he does need to get back on medication for this.  Says that he does not actually have episodes of crying but says that he does feel depressed and feels like he is not really interested in doing things that he used to find pleasure in. He says that there are no unusual stressors going on in his life right now says that things are same as usual but he just doesn't feel right. Feels depressed as above. Also says that it seems to be affecting his sleep and that he will sometimes wake up in the night thinking of things. Says that [name] had him on a medication that he prescribed here but says that he doesn't think that worked very well but cannot remember the name of it. I reviewed his chart and it looks like that was Lexapro. Patient says that he also knows in the past he has been on Wellbutrin at one point and was on Prozac at one point. He says that he remembers that the Wellbutrin worked well for him. At that OV I Rxed Wellbutrin. AT OV 04/13/14-- Pt says he is taking this as directed---now taking BID. He cant tell whether it ishelping much yet  or not.   At [place] 09/02/2014--he said that he thought he needed to get on Lexapro that we have discussed in the past. He said he was taking the Wellbutrin but it did not seem to be controlling his depression symptoms. Michela Pitcher that recently he had not been sleeping very well. Was waking up at about 2 or 3 AM and had hard time getting back to sleep. General did not seem very interested in doing things. Decreased motivation. Decreased pleasure in things that he used to have pleasure with. At that visit he was interested in adding Lexapro to Wellbutrin. Added Lexapro 10 mg daily.  10/01/2014--Today he states that he has noticed no difference since he added the Lexapro. Says that in the  past he did feel that the Wellbutrin was helping and feels that he should continue this but says he is not feeling any additional improvement with adding the Lexapro. He feels that he has a "negative attitude all the time "even when there is nothing to be negative about. Says that he is still having problems with insomnia. Says "last night and went to bed at 10 PM woke up at 2 AM. Rate from 2 AM to 3:30. It did go back to sleep around 3:30 but woke at 6 AM.   At office visit 06/22/14 his INR is subtherapeutic at 1.0. Prior to this, all of his INR checks here have been therapeutic and he has always been very compliant with taking his Coumadin and coming in for INR checks as directed. Has been on current dose of 2.5 all days except for 5 mg on Monday Wednesday Friday for a long time. At that [place] when I confronted him about why his INR is so low-- at first he says he may have skipped a couple of doses.  I discussed that an INR of 1.0 is the same as someone who is not on Coumadin at all.  After further discussion, he said that he has been out-of-town at his girlfriend's parents in [place], [place] and says that it has been "chaos there "says that they have sitters day and night with her parents.  Said he had been taking his morning pills every day because he remembers those. However, regarding the medicines that he usually takes later in the day--those are the ones that he has often been forgetting to take. That day I reviewed with him the importance of Coumadin and that this is one of the very most important medicines he is on. Reminded him of his A. fib and fact that he can form clot in his heart which can then go to his brain and cause major stroke.   At last 2-3 OVs, I reviewed that the last PSA was elevated and that I referred to urology but he had deferred secondary to cost. Again today he still says that he cannot afford to pay the co-pay to go to urology. I explained to him that there can be multiple  causes for elevated PSA but one of those causes is prostate cancer and the necessity for follow-up.  10/01/2014--Had routine Ov and labs--Creatinine was up to 1.36.  On that lab result, I said to stop metformin and to decrease Januvia from 100 to 50 mg. However on the result note back from the nurse it was reported that the Januvia was decreased to 50 mg but there was no mention of the metformin. Today patient states that he is still taking the metformin. States that he did decrease Januvia to  50 mg.  10/14/2014--he had office visit with me with symptoms consistent with unstable angina. Was transported to the ER via EMS. 10/16/2014 he underwent cardiac catheterization. PCI/stent DES to the RCA. He had follow-up with Cardiology-- Rosaria Ferries PA 11/03/14. He is to take aspirin 81 mg, Plavix 75, and Coumadin for 1 month. After 1 month-- he is to stop the aspirin.  Today he reports that he stopped the trazodone because it made him feel drowsy and groggy. Says that he is still having problems waking up in the night. Is no difficulty falling asleep but has trouble staying asleep. Wakes up 3 or 4 times per night. Says he has never tried Ambien.     Past Medical History  Diagnosis Date  . Persistent atrial fibrillation     recurrent atrial flutter since 2001 s/p DCCVs, multiple failed AADs, h/o tachy-mediated cardiomyopathy  . Hypertension     with hypertensive heart disease  . Hyperlipidemia   . Atrial flutter     radiofrequency ablation in 2001  . Obesity   . Chronic anticoagulation     chronic Coumadin anticoagulation  . Tobacco abuse   . Chronic obstructive pulmonary disease 04/20/2011  . GERD (gastroesophageal reflux disease)   . CAD (coronary artery disease)     a. Nonobstructive. Cardiac cath in 2001-50% mid RI, normal LM, LAD, RCA b. cath 10/16/2014 95% mid RCA treated with DES, 99% ost D1 medical management due to small aneurysmal segment  . Shortness of breath     "can come on at any  time" (03/14/2013)  . Sleep apnea     "dx'd; couldn't wear the mask" (03/14/2013)  . Diabetes mellitus, type 2   . Arthritis     "knees and lower back" (03/14/2013)     Home Meds:  Outpatient Prescriptions Prior to Visit  Medication Sig Dispense Refill  . ADVAIR DISKUS 250-50 MCG/DOSE AEPB TAKE 1 INHALATION BY MOUTH TWICE DAILY INTO LUNGS 60 each 4  . amLODipine (NORVASC) 5 MG tablet Take 1 tablet (5 mg total) by mouth daily. 30 tablet 5  . aspirin EC 81 MG EC tablet Take 1 tablet (81 mg total) by mouth daily. For 1 month    . buPROPion (WELLBUTRIN SR) 150 MG 12 hr tablet Take 1 tablet (150 mg total) by mouth 2 (two) times daily. 60 tablet 2  . celecoxib (CELEBREX) 200 MG capsule TAKE 1 CAPSULE BY MOUTH EVERY DAY 30 capsule 4  . clopidogrel (PLAVIX) 75 MG tablet Take 1 tablet (75 mg total) by mouth daily. 90 tablet 3  . CRESTOR 20 MG tablet TAKE 1 TABLET BY MOUTH EVERY DAY BEFORE BEDTIME 30 tablet 5  . escitalopram (LEXAPRO) 10 MG tablet Take 10 mg by mouth daily.     . fish oil-omega-3 fatty acids 1000 MG capsule Take 1 g by mouth daily.      . furosemide (LASIX) 20 MG tablet TAKE 1 TABLET BY MOUTH EVERY DAY 30 tablet 5  . isosorbide mononitrate (IMDUR) 30 MG 24 hr tablet TAKE 1 TABLET BY MOUTH EVERY DAY 30 tablet 3  . lisinopril (PRINIVIL,ZESTRIL) 20 MG tablet TAKE ONE TABLET BY MOUTH ONCE DAILY. 30 tablet 0  . metFORMIN (GLUCOPHAGE) 500 MG tablet Take 500 mg by mouth daily with breakfast.     . nitroGLYCERIN (NITROSTAT) 0.4 MG SL tablet Place 1 tablet (0.4 mg total) under the tongue every 5 (five) minutes as needed for chest pain. 25 tablet 1  . pantoprazole (PROTONIX) 40 MG tablet  TAKE 1 TABLET BY MOUTH EVERY DAY 30 tablet 11  . warfarin (COUMADIN) 5 MG tablet TAKE 1 TABLET (5MG ) BY MOUTH ON MONDAY, WEDNESDAY,FRIDAY AND TAKE 1/2 TABLET (2.5MG ) BY MOUTH ON SUNDAY, TUESDAY, THURSDAY, AND SATURDAY 30 tablet 3  . JANUVIA 100 MG tablet Take 100 mg by mouth daily.     . metoprolol succinate  (TOPROL-XL) 50 MG 24 hr tablet TAKE 3 TABLETS (150MG ) BY MOUTH TWICE DAILY 180 tablet 3  . sitaGLIPtin (JANUVIA) 50 MG tablet Take 1 tablet (50 mg total) by mouth daily. 30 tablet 5   No facility-administered medications prior to visit.     Allergies: No Known Allergies  History   Social History  . Marital Status: Legally Separated    Spouse Name: N/A  . Number of Children: 1  . Years of Education: N/A   Occupational History  . Unemployed   .     Social History Main Topics  . Smoking status: Former Smoker -- 1.00 packs/day for 42 years    Types: Cigarettes    Quit date: 12/31/2011  . Smokeless tobacco: Never Used  . Alcohol Use: Yes     Comment: 03/14/2013 "stopped drinking back in 2002; never had problem w/it"  . Drug Use: No  . Sexual Activity: Not Currently   Other Topics Concern  . 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          Family History  Problem Relation Age of Onset  . Alzheimer's disease Mother   . Osteoporosis Mother      Review of Systems:  See HPI for pertinent ROS. All other ROS negative.    Physical Exam: Blood pressure 130/74, pulse 76, temperature 98.3 F (36.8 C), temperature source Oral, resp. rate 18, height 6' (1.829 m), weight 225 lb (102.059 kg)., Body mass index is 30.51 kg/(m^2). General: WNWD WM. Appears in no acute distress. Neck: Supple. No thyromegaly. No lymphadenopathy. No carotid bruits. Lungs: Clear bilaterally to auscultation without wheezes, rales, or rhonchi. Breathing is unlabored. Heart: Irreg rhythm Abdomen: Soft, non-tender, non-distended with normoactive bowel sounds. No hepatomegaly. No rebound/guarding. No obvious abdominal masses. Musculoskeletal:  Strength and tone normal for age. Extremities/Skin: Warm and dry. No clubbing or cyanosis. No  edema. No rashes or suspicious lesions. Neuro: Alert and oriented X 3. Moves all extremities spontaneously. Gait is normal. CNII-XII grossly in tact. Psych:  Responds to questions appropriately with a normal affect. Diabetic foot exam: Inspection is normal.  Sensation is intact and normal.  He has 2+ posterior tibial pulses bilaterally. Trace dorsalis pedis pulses bilaterally.       ASSESSMENT AND PLAN:  64 y.o. year old male with   FINANCIAL DISTRESS---SEE # 12 AND # 13 BELOW. ---CANNOT AFFORD COPAY TO SEE GI OR UROLOGY.                                       ----ALSO, SEE HPI---PRIOR TO COMING TO THIS OFFICE, WENT TO [facility]PART[facility]    -----WILL HAVE OUR STAFF SEE IF THN OR SOME OTHER ASSISTANCE CAN HELP HIM  11/19/2014---DISCUSSED THIS WITH KIM AGAIN TODAY----  He states he has Medicare secondary to Disability--cannot use savings cards  with Medicare.    1.Coronary Artery Disease He underwent cardiac catheterization 03/17/2013. Was not favorable for PCI. He has had medical treatment of CAD. --Cardiac Cath 10/16/2014--PCI/Stent--DES--RCA    2. Chronic anticoagulation---PT/INR today--11/19/2014---1.8.  Discussed at office visit. Continue current dose of Coumadin which is 5 mg every day except for 2.5 mg on Mondays Wednesdays Fridays.      Continue current dose but RECHECK 2 WEEKS. 3. Atrial fibrillation----------see HPI--He recently sees Cardiology and sees EP-- [name] 4. Atrial flutter 5. Pacemaker  6. Depression  7. Insomnia 11/19/2014--- reports that trazodone made him feel drowsy. 11/19/2014 prescribed Ambien 10 mg.#30+2  9. Diabetes mellitus, type 2 10/01/2014--Had routine Ov and labs--Creatinine was up to 1.36.  On that lab result, I said to stop metformin and to decrease Januvia from 100 to 50 mg. However on the result note back from the nurse it was reported that the Januvia was decreased to 50 mg but there was no mention of the metformin. Today patient  states that he is still taking the metformin. States that he did decrease Januvia to 50 mg. At [place] 11/19/2014--I reviewed that last creatinine checked at the hospital was 10/17/14 creatinine was 1.29. At this point we'll continue the metformin and will continue the Januvia at 50 mg. Will need to monitor creatinine closely at follow-up visit.  - Microalbumin, urine--- 10/01/2014   He states that he checks his blood sugar about 3 times per week. Checks at different times of day. Never gets < 80. He is on ACE inhibitor. He is on statin therapy. He had DES stent 10/16/2014--To cont ASA for 1 month then D/C ASA. Cont Plavix and Coumadin. (Even prior to DES stent, was already on Plavix and Coumadin per Cardiology)  Foot exam is stable. Patient states that he had an eye exam about one year ago. Is due to schedule followup and patient plans to schedule this. He was told that exam showed "no diabetic damage " At [place] 10/01/2014 he states that his eye exam is due "right now ". I wrote on his AVS to remind him to call to schedule follow-up eye exam.  He received Pneumovax 23 here 01/05/2011 No further pneumonia vaccine indicated until age 7  10. Hypertension At goal. On ACE inhibitor. BMET normal. Pressure is elevated but I do not want to start adjusting these medications as he is seeing cardiology and EP and they are adjusting medications. We'll leave this to them to follow-up.  11. Hyperlipidemia At last lab -- LFTs normal. Lipid Panel is excellent. Continued Crestor 20 mg. He is fasting today and we'll recheck FLP LFT today.  10. Chronic obstructive pulmonary disease Quit smoking 2013.  11. GASTROESOPHAGEAL REFLUX DISEASE Protonix can be used with Plavix to continue this one.  12. Prostate cancer screening 13. Elevated PSA -PSA was elevated 02/05/2014.--4.91.  I ordered referral to urology. Patient reported that he could not afford the co-pay and did not follow-up with urology.  PSA repeated  06/22/2014---4.71---I had noted for staff to see if  can find any type of assistance program to help cover cost so that he can follow-up with urology.However, this has not happened.  AT [place] 10/01/2014--I am discussing with staff again to investigate options for financial assistance/other services, options to help him.   2. Screening for colorectal cancer The following is copied form his OV on 06/26/2013:   "He reports that he has never had a colonoscopy. Says in the past he was going to -but that's when  he then lost his insurance. He is agreeable to proceed with this and is agreeable for me to do her referral for followup with GI. - Ambulatory referral to Gastroenterology "  At f/u [place] 11/06/2013 he reports: He cannot even afford the co-pay to see GI for the office visit. His co-pay for that visit was going to be $45. He does not even know what his cost would be for the colonoscopy itself.  14. Immunizations: He received Pneumovax 23 here in the office on 01/05/2011 No further pneumonia vaccine indicated until age 64. He received tetanus here at our office 01/05/2011 Influenza Vaccine----Given here 04/13/2014 Zostavax: So far, have not even discussed, as cost will likely be prohibitive      Subjective:   Patient presents for Medicare Annual/Subsequent preventive examination.   Review Past Medical/Family/Social: These have all been reviewed and updated.   Risk Factors  Current exercise habits: He says that they walk in thrift stores. No other exercise. Dietary issues discussed: He has been repeatedly educated regarding low cholesterol low sodium and diabetic diet.  Cardiac risk factors: DM, HTN, HLD, Past smoker, Male, Age  Depression Screen  (Note: if answer to either of the following is "Yes", a more complete depression screening is indicated)  Over the past two weeks, have you felt down, depressed or hopeless? Yes--3/3 Over the past two weeks, have you felt little interest or pleasure  in doing things?--Some--2/3 Do you often feel hopeless? Some Do you cry easily over simple problems? No   Activities of Daily Living  In your present state of health, do you have any difficulty performing the following activities?:  Driving? No  Managing money? No  Feeding yourself? No  Getting from bed to chair? No  Climbing a flight of stairs? No  Preparing food and eating?: No  Bathing or showering? No  Getting dressed: No  Getting to the toilet? No  Using the toilet:No  Moving around from place to place: No  In the past year have you fallen or had a near fall?:No  Are you sexually active? No  Do you have more than one partner? No   Hearing Difficulties: No  Do you often ask people to speak up or repeat themselves? No  Do you experience ringing or noises in your ears? No Do you have difficulty understanding soft or whispered voices? No  Do you feel that you have a problem with memory? No Do you often misplace items? No  Do you feel safe at home? Yes  Cognitive Testing  Alert? Yes Normal Appearance?Yes  Oriented to person? Yes Place? Yes  Time? Yes  Recall of three objects? Yes  Can perform simple calculations? Yes  Displays appropriate judgment?Yes  Can read the correct time from a watch face?Yes   List the Names of Other Physician/Practitioners you currently use:  Cardiology and EP  Indicate any recent Medical Services you may have received from other than [facility] providers in the past year (date may be approximate).  None  Screening Tests / Date Colonoscopy--- see above. Cannot afford.                     Zostavax ------- see above. Cannot afford. Mammogram ---N/A Influenza Vaccine --up to date Tetanus/tdap--- up to date     Assessment:    Annual wellness medicare exam   Plan:    During the course of the visit the patient was educated and counseled about appropriate screening and preventive services including:  Screening mammography  Colorectal cancer  screening  Shingles vaccine. Prescription given to that she can get the vaccine at the pharmacy or Medicare part D.  Screen + for depression. PHQ- 9 score of 12 (moderate depression). We discussed the options of counseling versus possibly a medication. I encouraged her strongly think about the counseling. She is going through some medical problems currently and her husband is as well Mrs. been very stressful for her. She says she will think about it. She does have Xanax to use as needed. Though she may benefit from an SSRI for her more depressive type symptoms but she wants to hold off at this time.  I aksed her to please have her cardioloist send records since we have none on file.  Diet review for nutrition referral? Yes ____ Not Indicated __x__  Patient Instructions (the written plan) was given to the patient.  Medicare Attestation  I have personally reviewed:  The patient's medical and social history  Their use of alcohol, tobacco or illicit drugs  Their current medications and supplements  The patient's functional ability including ADLs,fall risks, home safety risks, cognitive, and hearing and visual impairment  Diet and physical activities  Evidence for depression or mood disorders  The patient's weight, height, BMI, and visual acuity have been recorded in the chart. I have made referrals, counseling, and provided education to the patient based on review of the above and I have provided the patient with a written personalized care plan for preventive services.         Signed, [address] [place], [place], [facility] 11/19/2014 11:48 AM

## 2014-12-03 ENCOUNTER — Ambulatory Visit (INDEPENDENT_AMBULATORY_CARE_PROVIDER_SITE_OTHER): Payer: Commercial Managed Care - HMO | Admitting: Physician Assistant

## 2014-12-03 ENCOUNTER — Encounter: Payer: Self-pay | Admitting: Physician Assistant

## 2014-12-03 VITALS — BP 100/60 | HR 68 | Temp 98.7°F | Resp 20 | Ht 72.0 in | Wt 227.0 lb

## 2014-12-03 DIAGNOSIS — I4891 Unspecified atrial fibrillation: Secondary | ICD-10-CM | POA: Diagnosis not present

## 2014-12-03 DIAGNOSIS — R972 Elevated prostate specific antigen [PSA]: Secondary | ICD-10-CM

## 2014-12-03 DIAGNOSIS — I481 Persistent atrial fibrillation: Secondary | ICD-10-CM

## 2014-12-03 DIAGNOSIS — Z7901 Long term (current) use of anticoagulants: Secondary | ICD-10-CM

## 2014-12-03 DIAGNOSIS — I4819 Other persistent atrial fibrillation: Secondary | ICD-10-CM

## 2014-12-03 LAB — PT WITH INR/FINGERSTICK
INR, fingerstick: 2.9 — ABNORMAL HIGH (ref 0.80–1.20)
PT, fingerstick: 34.7 seconds — ABNORMAL HIGH (ref 10.4–12.5)

## 2014-12-03 NOTE — Progress Notes (Signed)
D   Patient ID: [name] Cook MRN: [id number], DOB: 1951-02-28, 64 y.o. Date of Encounter: @DATE @  Chief Complaint:  Chief Complaint  Patient presents with  . Follow-up    warfarin f/u    HPI: 64 y.o. year old white male  presents for followup office visit. Here to check his PT/INR.   AT OV 12/03/2014--HE WAS HERE JUST FOR INR CHECK---NEXT OV WILL BE TO ALSO CHECK A1C, ETC IN ADDITION TO INR. --WILL BE DUE FOR ROV. AT OV 12/03/2014---I ALSO PLACED ANOTHER ORDER FOR UROLOGY REFERRAL--- Pt states that he can save enough money to pay that co-pay and then discuss a payment plan with them. Discussed necessity of follow-up regarding elevated PSA.  At his last office visit here, INR was 1.8.  Told him to continue his current dose of Coumadin return in 2 weeks to recheck PT/INR. Today patient states he has been taking his Coumadin as directed. He has had no bleeding. He is on no new medications.    He had his first office visit with me on 06/19/2013.   He had been seen in our office in the past by [name].  ----First visit with him was 01/05/2011.  ----He only saw him one other visit and that  was 04/19/2011.  Since then, patient has been seeing [facility].  Had also [facility].  He had been having his PT INR checked at [facility].  However he wanted to start having it checked at our office.  He presented here on 06/19/13 for PT/INR check and also to re- establish primary care here.  He is on Coumadin for history of atrial fibrillation/atrial flutter. Goal INR is 2-3. He is taking 2.5 mg on all days except for 5 mg on Mondays /  Wednesdays /  Fridays. No skipped doses.  No bleeding.  No significant change in dietary intake of Vitamin K containing foods.  No change in medications.  He is taking his diabetic medications as directed. He checks his blood sugar about 3 times per week at different times of day and always  gets good readings but never gets low readings--never anything lower than 80.  At [place] 04/13/14--he said he was having diarrhea for months. No abdominal pain--just diarrhea.  IDecreased Metformin--At OV 06/22/14 pt says diarrhea resolved since decreasing the dose of Metformin.  He is taking blood pressure medications as directed. No lightheadedness. No lower extremity edema.  He is taking his Crestor as directed. No myalgias or other adverse effects.   At [place] 03/09/2014 he says that he did want to discuss one thing. Says that he has been on medications for depression in the past but then he was without insurance for a while so he had gone off of those medications. He is feeling that he does need to get back on medication for this.  Says that he does not actually have episodes of crying but says that he does feel depressed and feels like he is not really interested in doing things that he used to find pleasure in. He says that there are no unusual stressors going on in his life right now says that things are same as usual but he just doesn't feel right. Feels depressed as above. Also says that it seems to be affecting his sleep and that he will sometimes wake up in the night thinking of things. Says that [name] had him on a medication that he prescribed  here but says that he doesn't think that worked very well but cannot remember the name of it. I reviewed his chart and it looks like that was Lexapro. Patient says that he also knows in the past he has been on Wellbutrin at one point and was on Prozac at one point. He says that he remembers that the Wellbutrin worked well for him. At that OV I Rxed Wellbutrin. AT OV 04/13/14-- Pt says he is taking this as directed---now taking BID. He cant tell whether it ishelping much yet or not.   At [place] 09/02/2014--he said that he thought he needed to get on Lexapro that we have discussed in the past. He said he was taking the Wellbutrin but it did not seem to be  controlling his depression symptoms. Michela Pitcher that recently he had not been sleeping very well. Was waking up at about 2 or 3 AM and had hard time getting back to sleep. General did not seem very interested in doing things. Decreased motivation. Decreased pleasure in things that he used to have pleasure with. At that visit he was interested in adding Lexapro to Wellbutrin. Added Lexapro 10 mg daily.  10/01/2014--Today he states that he has noticed no difference since he added the Lexapro. Says that in the past he did feel that the Wellbutrin was helping and feels that he should continue this but says he is not feeling any additional improvement with adding the Lexapro. He feels that he has a "negative attitude all the time "even when there is nothing to be negative about. Says that he is still having problems with insomnia. Says "last night and went to bed at 10 PM woke up at 2 AM. Rate from 2 AM to 3:30. It did go back to sleep around 3:30 but woke at 6 AM.   At office visit 06/22/14 his INR is subtherapeutic at 1.0. Prior to this, all of his INR checks here have been therapeutic and he has always been very compliant with taking his Coumadin and coming in for INR checks as directed. Has been on current dose of 2.5 all days except for 5 mg on Monday Wednesday Friday for a long time. At that [place] when I confronted him about why his INR is so low-- at first he says he may have skipped a couple of doses.  I discussed that an INR of 1.0 is the same as someone who is not on Coumadin at all.  After further discussion, he said that he has been out-of-town at his girlfriend's parents in [place], [place] and says that it has been "chaos there "says that they have sitters day and night with her parents.  Said he had been taking his morning pills every day because he remembers those. However, regarding the medicines that he usually takes later in the day--those are the ones that he has often been forgetting to take. That  day I reviewed with him the importance of Coumadin and that this is one of the very most important medicines he is on. Reminded him of his A. fib and fact that he can form clot in his heart which can then go to his brain and cause major stroke.   At last 2-3 OVs, I reviewed that the last PSA was elevated and that I referred to urology but he had deferred secondary to cost. Again today he still says that he cannot afford to pay the co-pay to go to urology. I explained to him that there can be  multiple causes for elevated PSA but one of those causes is prostate cancer and the necessity for follow-up.  10/01/2014--Had routine Ov and labs--Creatinine was up to 1.36.  On that lab result, I said to stop metformin and to decrease Januvia from 100 to 50 mg. However on the result note back from the nurse it was reported that the Januvia was decreased to 50 mg but there was no mention of the metformin. Today patient states that he is still taking the metformin. States that he did decrease Januvia to 50 mg.  10/14/2014--he had office visit with me with symptoms consistent with unstable angina. Was transported to the ER via EMS. 10/16/2014 he underwent cardiac catheterization. PCI/stent DES to the RCA. He had follow-up with Cardiology-- Rosaria Ferries PA 11/03/14. He is to take aspirin 81 mg, Plavix 75, and Coumadin for 1 month. After 1 month-- he is to stop the aspirin.  11/19/2014-- he reports that he stopped the trazodone because it made him feel drowsy and groggy. Says that he is still having problems waking up in the night. Is no difficulty falling asleep but has trouble staying asleep. Wakes up 3 or 4 times per night. Says he has never tried Ambien.     Past Medical History  Diagnosis Date  . Persistent atrial fibrillation     recurrent atrial flutter since 2001 s/p DCCVs, multiple failed AADs, h/o tachy-mediated cardiomyopathy  . Hypertension     with hypertensive heart disease  . Hyperlipidemia   .  Atrial flutter     radiofrequency ablation in 2001  . Obesity   . Chronic anticoagulation     chronic Coumadin anticoagulation  . Tobacco abuse   . Chronic obstructive pulmonary disease 04/20/2011  . GERD (gastroesophageal reflux disease)   . CAD (coronary artery disease)     a. Nonobstructive. Cardiac cath in 2001-50% mid RI, normal LM, LAD, RCA b. cath 10/16/2014 95% mid RCA treated with DES, 99% ost D1 medical management due to small aneurysmal segment  . Shortness of breath     "can come on at any time" (03/14/2013)  . Sleep apnea     "dx'd; couldn't wear the mask" (03/14/2013)  . Diabetes mellitus, type 2   . Arthritis     "knees and lower back" (03/14/2013)     Home Meds:  Outpatient Prescriptions Prior to Visit  Medication Sig Dispense Refill  . ADVAIR DISKUS 250-50 MCG/DOSE AEPB TAKE 1 INHALATION BY MOUTH TWICE DAILY INTO LUNGS 60 each 4  . amLODipine (NORVASC) 5 MG tablet Take 1 tablet (5 mg total) by mouth daily. 30 tablet 5  . buPROPion (WELLBUTRIN SR) 150 MG 12 hr tablet Take 1 tablet (150 mg total) by mouth 2 (two) times daily. 60 tablet 2  . celecoxib (CELEBREX) 200 MG capsule TAKE 1 CAPSULE BY MOUTH EVERY DAY 30 capsule 4  . clopidogrel (PLAVIX) 75 MG tablet Take 1 tablet (75 mg total) by mouth daily. 90 tablet 3  . CRESTOR 20 MG tablet TAKE 1 TABLET BY MOUTH EVERY DAY BEFORE BEDTIME 30 tablet 5  . escitalopram (LEXAPRO) 10 MG tablet Take 10 mg by mouth daily.     . fish oil-omega-3 fatty acids 1000 MG capsule Take 1 g by mouth daily.      . furosemide (LASIX) 20 MG tablet TAKE 1 TABLET BY MOUTH EVERY DAY 30 tablet 5  . isosorbide mononitrate (IMDUR) 30 MG 24 hr tablet TAKE 1 TABLET BY MOUTH EVERY DAY 30 tablet 3  .  lisinopril (PRINIVIL,ZESTRIL) 20 MG tablet TAKE ONE TABLET BY MOUTH ONCE DAILY. 30 tablet 0  . metFORMIN (GLUCOPHAGE) 500 MG tablet Take 500 mg by mouth daily with breakfast.     . metoprolol succinate (TOPROL-XL) 50 MG 24 hr tablet TAKE 3 TABLETS (150MG ) BY  MOUTH TWICE DAILY 180 tablet 5  . nitroGLYCERIN (NITROSTAT) 0.4 MG SL tablet Place 1 tablet (0.4 mg total) under the tongue every 5 (five) minutes as needed for chest pain. 25 tablet 1  . pantoprazole (PROTONIX) 40 MG tablet TAKE 1 TABLET BY MOUTH EVERY DAY 30 tablet 11  . sitaGLIPtin (JANUVIA) 50 MG tablet Take 1 tablet (50 mg total) by mouth daily. 30 tablet 5  . warfarin (COUMADIN) 5 MG tablet TAKE 1 TABLET (5MG ) BY MOUTH ON MONDAY, WEDNESDAY,FRIDAY AND TAKE 1/2 TABLET (2.5MG ) BY MOUTH ON SUNDAY, TUESDAY, THURSDAY, AND SATURDAY 30 tablet 3  . zolpidem (AMBIEN) 10 MG tablet Take 1 tablet (10 mg total) by mouth at bedtime as needed for sleep. 30 tablet 2  . aspirin EC 81 MG EC tablet Take 1 tablet (81 mg total) by mouth daily. For 1 month (Patient not taking: Reported on 12/03/2014)     No facility-administered medications prior to visit.     Allergies: No Known Allergies  Social History   Social History  . Marital Status: Legally Separated    Spouse Name: N/A  . Number of Children: 1  . Years of Education: N/A   Occupational History  . Unemployed   .     Social History Main Topics  . Smoking status: Former Smoker -- 1.00 packs/day for 42 years    Types: Cigarettes    Quit date: 12/31/2011  . Smokeless tobacco: Never Used  . Alcohol Use: Yes     Comment: 03/14/2013 "stopped drinking back in 2002; never had problem w/it"  . Drug Use: No  . Sexual Activity: Not Currently   Other Topics Concern  . 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          Family History  Problem Relation Age of Onset  . Alzheimer's disease Mother   . Osteoporosis Mother      Review of Systems:  See HPI for pertinent ROS. All other ROS negative.    Physical Exam: Blood pressure 100/60, pulse 68, temperature 98.7 F (37.1  C), temperature source Oral, resp. rate 20, height 6' (1.829 m), weight 227 lb (102.967 kg)., Body mass index is 30.78 kg/(m^2). General: WNWD WM. Appears in no acute distress. Neck: Supple. No thyromegaly. No lymphadenopathy. No carotid bruits. Lungs: Clear bilaterally to auscultation without wheezes, rales, or rhonchi. Breathing is unlabored. Heart: Irreg rhythm Abdomen: Soft, non-tender, non-distended with normoactive bowel sounds. No hepatomegaly. No rebound/guarding. No obvious abdominal masses. Musculoskeletal:  Strength and tone normal for age. Extremities/Skin: Warm and dry. No clubbing or cyanosis. No edema. No rashes or suspicious lesions. Neuro: Alert and oriented X 3. Moves all extremities spontaneously. Gait is normal. CNII-XII grossly in tact. Psych:  Responds to questions appropriately with a normal affect. Diabetic foot exam: Inspection is normal.  Sensation is intact and normal.  He has 2+ posterior tibial pulses bilaterally. Trace dorsalis pedis pulses bilaterally.       ASSESSMENT AND PLAN:  64 y.o. year old male with  FINANCIAL DISTRESS---SEE # 12 AND # 13 BELOW. ---CANNOT AFFORD COPAY TO SEE GI OR UROLOGY.                                       ----ALSO, SEE HPI---PRIOR TO COMING TO THIS OFFICE, WENT TO [facility]PART[facility]    -----WILL HAVE OUR STAFF SEE IF THN OR SOME OTHER ASSISTANCE CAN HELP HIM  11/19/2014---DISCUSSED THIS WITH KIM AGAIN TODAY---- 12/03/2014--discussed with Maudie Mercury again today while patient still here.  Discussed with patient as well.  Discussed with him that the GI appointment can wait but that the Urology appointment is much more necessary. Discussed with him again if he could possibly pay the $40 co-pay to see urology and he says that he can. Therefore, I have placed another order for referral to urology today. He will save money to pay the $40 co-pay for that visit and then will talk to them about finances and a payment plan etc.  He  states he has Medicare secondary to Disability--cannot use savings cards with Medicare.   2. Chronic anticoagulation---PT/INR today--12/03/2014---2.9.  Discussed at office visit. Continue current dose of Coumadin which is 5 mg every day except for 2.5 mg on Mondays Wednesdays Fridays.      Continue current dose  RECHECK 4 WEEKS. 3. Atrial fibrillation----------see HPI--He recently sees Cardiology and sees EP-- [name] 4. Atrial flutter     1.Coronary Artery Disease He underwent cardiac catheterization 03/17/2013. Was not favorable for PCI. He has had medical treatment of CAD. --Cardiac Cath 10/16/2014--PCI/Stent--DES--RCA    2. Chronic anticoagulation---PT/INR today--11/19/2014---1.8.  Discussed at office visit. Continue current dose of Coumadin which is 5 mg every day except for 2.5 mg on Mondays Wednesdays Fridays.      Continue current dose but RECHECK 2 WEEKS. 3. Atrial fibrillation----------see HPI--He recently sees Cardiology and sees EP-- [name] 4. Atrial flutter 5. Pacemaker  6. Depression  7. Insomnia 11/19/2014--- reports that trazodone made him feel drowsy. 11/19/2014 prescribed Ambien 10 mg.#30+2  9. Diabetes mellitus, type 2 10/01/2014--Had routine Ov and labs--Creatinine was up to 1.36.  On that lab result, I said to stop metformin and to decrease Januvia from 100 to 50 mg. However on the result note back from the nurse it was reported that the Januvia was decreased to 50 mg but there was no mention of the metformin. Today patient states that he is still taking the metformin. States that he did decrease Januvia to 50 mg. At [place] 11/19/2014--I reviewed that last creatinine checked at the hospital was 10/17/14 creatinine was 1.29. At this point we'll continue the metformin and will continue the Januvia at 50 mg. Will need to monitor creatinine closely at follow-up visit.  - Microalbumin, urine--- 10/01/2014   He states that he checks his blood sugar about 3 times per week.  Checks at different times of day. Never gets < 80. He is on ACE inhibitor. He is on statin therapy. He had DES stent 10/16/2014--To cont ASA for 1 month then D/C ASA. Cont Plavix and Coumadin. (Even prior to DES stent, was already on Plavix and Coumadin per Cardiology)  Foot exam is stable. Patient states that he had an eye exam about one year ago. Is due to schedule followup and patient plans to schedule this. He was told that exam showed "no diabetic damage " At [place] 10/01/2014 he states that his eye  exam is due "right now ". I wrote on his AVS to remind him to call to schedule follow-up eye exam.  He received Pneumovax 23 here 01/05/2011 No further pneumonia vaccine indicated until age 36  10. Hypertension At goal. On ACE inhibitor. BMET normal. Pressure is elevated but I do not want to start adjusting these medications as he is seeing cardiology and EP and they are adjusting medications. We'll leave this to them to follow-up.  11. Hyperlipidemia At last lab -- LFTs normal. Lipid Panel is excellent. Continued Crestor 20 mg. He is fasting today and we'll recheck FLP LFT today.  10. Chronic obstructive pulmonary disease Quit smoking 2013.  11. GASTROESOPHAGEAL REFLUX DISEASE Protonix can be used with Plavix to continue this one.  12. Prostate cancer screening 13. Elevated PSA -PSA was elevated 02/05/2014.--4.91.  I ordered referral to urology. Patient reported that he could not afford the co-pay and did not follow-up with urology.  PSA repeated 06/22/2014---4.71---I had noted for staff to see if  can find any type of assistance program to help cover cost so that he can follow-up with urology.However, this has not happened.  AT [place] 10/01/2014--I am discussing with staff again to investigate options for financial assistance/other services, options to help him.   36. Screening for colorectal cancer The following is copied form his OV on 06/26/2013:   "He reports that he has never had a  colonoscopy. Says in the past he was going to -but that's when he then lost his insurance. He is agreeable to proceed with this and is agreeable for me to do her referral for followup with GI. - Ambulatory referral to Gastroenterology "  At f/u OV 11/06/2013 he reports: He cannot even afford the co-pay to see GI for the office visit. His co-pay for that visit was going to be $45. He does not even know what his cost would be for the colonoscopy itself.  14. Immunizations: He received Pneumovax 23 here in the office on 01/05/2011 No further pneumonia vaccine indicated until age 51. He received tetanus here at our office 01/05/2011 Influenza Vaccine----Given here 04/13/2014 Zostavax: So far, have not even discussed, as cost will likely be prohibitive      Subjective:   Patient presents for Medicare Annual/Subsequent preventive examination.   Review Past Medical/Family/Social: These have all been reviewed and updated.   Risk Factors  Current exercise habits: He says that they walk in thrift stores. No other exercise. Dietary issues discussed: He has been repeatedly educated regarding low cholesterol low sodium and diabetic diet.  Cardiac risk factors: DM, HTN, HLD, Past smoker, Male, Age  Depression Screen  (Note: if answer to either of the following is "Yes", a more complete depression screening is indicated)  Over the past two weeks, have you felt down, depressed or hopeless? Yes--3/3 Over the past two weeks, have you felt little interest or pleasure in doing things?--Some--2/3 Do you often feel hopeless? Some Do you cry easily over simple problems? No   Activities of Daily Living  In your present state of health, do you have any difficulty performing the following activities?:  Driving? No  Managing money? No  Feeding yourself? No  Getting from bed to chair? No  Climbing a flight of stairs? No  Preparing food and eating?: No  Bathing or showering? No  Getting dressed: No    Getting to the toilet? No  Using the toilet:No  Moving around from place to place: No  In the past year  have you fallen or had a near fall?:No  Are you sexually active? No  Do you have more than one partner? No   Hearing Difficulties: No  Do you often ask people to speak up or repeat themselves? No  Do you experience ringing or noises in your ears? No Do you have difficulty understanding soft or whispered voices? No  Do you feel that you have a problem with memory? No Do you often misplace items? No  Do you feel safe at home? Yes  Cognitive Testing  Alert? Yes Normal Appearance?Yes  Oriented to person? Yes Place? Yes  Time? Yes  Recall of three objects? Yes  Can perform simple calculations? Yes  Displays appropriate judgment?Yes  Can read the correct time from a watch face?Yes   List the Names of Other Physician/Practitioners you currently use:  Cardiology and EP  Indicate any recent Medical Services you may have received from other than [facility] providers in the past year (date may be approximate).  None  Screening Tests / Date Colonoscopy--- see above. Cannot afford.                     Zostavax ------- see above. Cannot afford. Mammogram ---N/A Influenza Vaccine --up to date Tetanus/tdap--- up to date     Assessment:    Annual wellness medicare exam   Plan:    During the course of the visit the patient was educated and counseled about appropriate screening and preventive services including:  Screening mammography  Colorectal cancer screening  Shingles vaccine. Prescription given to that she can get the vaccine at the pharmacy or Medicare part D.  Screen + for depression. PHQ- 9 score of 12 (moderate depression). We discussed the options of counseling versus possibly a medication. I encouraged her strongly think about the counseling. She is going through some medical problems currently and her husband is as well Mrs. been very stressful for her. She says she will  think about it. She does have Xanax to use as needed. Though she may benefit from an SSRI for her more depressive type symptoms but she wants to hold off at this time.  I aksed her to please have her cardioloist send records since we have none on file.  Diet review for nutrition referral? Yes ____ Not Indicated __x__  Patient Instructions (the written plan) was given to the patient.  Medicare Attestation  I have personally reviewed:  The patient's medical and social history  Their use of alcohol, tobacco or illicit drugs  Their current medications and supplements  The patient's functional ability including ADLs,fall risks, home safety risks, cognitive, and hearing and visual impairment  Diet and physical activities  Evidence for depression or mood disorders  The patient's weight, height, BMI, and visual acuity have been recorded in the chart. I have made referrals, counseling, and provided education to the patient based on review of the above and I have provided the patient with a written personalized care plan for preventive services.         Signed, [address] [place], [place], B[facility] 12/03/2014 1:25 PM

## 2014-12-17 ENCOUNTER — Other Ambulatory Visit: Payer: Self-pay | Admitting: Physician Assistant

## 2014-12-17 ENCOUNTER — Other Ambulatory Visit: Payer: Self-pay | Admitting: Family Medicine

## 2014-12-17 NOTE — Telephone Encounter (Signed)
Refill appropriate and filled per protocol. 

## 2015-01-04 ENCOUNTER — Encounter: Payer: Self-pay | Admitting: Physician Assistant

## 2015-01-04 ENCOUNTER — Ambulatory Visit (INDEPENDENT_AMBULATORY_CARE_PROVIDER_SITE_OTHER): Payer: Commercial Managed Care - HMO | Admitting: Physician Assistant

## 2015-01-04 VITALS — BP 122/70 | HR 80 | Temp 97.3°F | Resp 18 | Wt 230.0 lb

## 2015-01-04 DIAGNOSIS — Z23 Encounter for immunization: Secondary | ICD-10-CM | POA: Diagnosis not present

## 2015-01-04 DIAGNOSIS — Z7901 Long term (current) use of anticoagulants: Secondary | ICD-10-CM | POA: Diagnosis not present

## 2015-01-04 LAB — PT WITH INR/FINGERSTICK
INR, fingerstick: 4.1 — ABNORMAL HIGH (ref 0.80–1.20)
PT, fingerstick: 48.9 seconds — ABNORMAL HIGH (ref 10.4–12.5)

## 2015-01-04 MED ORDER — ZOLPIDEM TARTRATE 5 MG PO TABS: 5.0000 mg | ORAL_TABLET | Freq: Every evening | ORAL | Status: DC | PRN

## 2015-01-04 NOTE — Progress Notes (Signed)
D   Patient ID: [name] Cook MRN: [id number], DOB: 08/05/1950, 64 y.o. Date of Encounter: @DATE @  Chief Complaint:  Chief Complaint  Patient presents with  . routine check up  . PT/INR    HPI: 64 y.o. year old white male  presents for routine followup office visit. Also here to check his PT/INR.   He had his first office visit with me on 06/19/2013.   He had been seen in our office in the past by [name].  ----First visit with him was 01/05/2011.  ----He only saw him one other visit and that  was 04/19/2011.  Since then, patient has been seeing [facility].  [facility].  He had been having his PT INR checked at [facility].  However he wanted to start having it checked at our office.  He presented here on 06/19/13 for PT/INR check and also to re- establish primary care here.  He is on Coumadin for history of atrial fibrillation/atrial flutter. Goal INR is 2-3. He is taking 2.5 mg on all days except for 5 mg on Mondays /  Wednesdays /  Fridays. No skipped doses.  No bleeding.  No significant change in dietary intake of Vitamin K containing foods.  No change in medications.  He is taking his diabetic medications as directed. He checks his blood sugar about 3 times per week at different times of day and always gets good readings but never gets low readings--never anything lower than 80.  At [place] 04/13/14--he said he was having diarrhea for months. No abdominal pain--just diarrhea.  IDecreased Metformin--At OV 06/22/14 pt says diarrhea resolved since decreasing the dose of Metformin.  He is taking blood pressure medications as directed. No lightheadedness. No lower extremity edema.  He is taking his Crestor as directed. No myalgias or other adverse effects.   At [place] 03/09/2014 he says that he did want to discuss one thing. Says that he has been on medications for depression in the past but then he was  without insurance for a while so he had gone off of those medications. He is feeling that he does need to get back on medication for this.  Says that he does not actually have episodes of crying but says that he does feel depressed and feels like he is not really interested in doing things that he used to find pleasure in. He says that there are no unusual stressors going on in his life right now says that things are same as usual but he just doesn't feel right. Feels depressed as above. Also says that it seems to be affecting his sleep and that he will sometimes wake up in the night thinking of things. Says that [name] had him on a medication that he prescribed here but says that he doesn't think that worked very well but cannot remember the name of it. I reviewed his chart and it looks like that was Lexapro. Patient says that he also knows in the past he has been on Wellbutrin at one point and was on Prozac at one point. He says that he remembers that the Wellbutrin worked well for him. At that OV I Rxed Wellbutrin. AT OV 04/13/14-- Pt says he is taking this as directed---now taking BID. He cant tell whether it ishelping much yet or not.   At [place] 09/02/2014--he said that he thought he needed to get on Lexapro that we have discussed  in the past. He said he was taking the Wellbutrin but it did not seem to be controlling his depression symptoms. Michela Pitcher that recently he had not been sleeping very well. Was waking up at about 2 or 3 AM and had hard time getting back to sleep. General did not seem very interested in doing things. Decreased motivation. Decreased pleasure in things that he used to have pleasure with. At that visit he was interested in adding Lexapro to Wellbutrin. Added Lexapro 10 mg daily.  10/01/2014--Today he states that he has noticed no difference since he added the Lexapro. Says that in the past he did feel that the Wellbutrin was helping and feels that he should continue this but says he  is not feeling any additional improvement with adding the Lexapro. He feels that he has a "negative attitude all the time "even when there is nothing to be negative about. Says that he is still having problems with insomnia. Says "last night and went to bed at 10 PM woke up at 2 AM. Rate from 2 AM to 3:30. It did go back to sleep around 3:30 but woke at 6 AM.   At office visit 06/22/14 his INR is subtherapeutic at 1.0. Prior to this, all of his INR checks here have been therapeutic and he has always been very compliant with taking his Coumadin and coming in for INR checks as directed. Has been on current dose of 2.5 all days except for 5 mg on Monday Wednesday Friday for a long time. At that [place] when I confronted him about why his INR is so low-- at first he says he may have skipped a couple of doses.  I discussed that an INR of 1.0 is the same as someone who is not on Coumadin at all.  After further discussion, he said that he has been out-of-town at his girlfriend's parents in [place], [place] and says that it has been "chaos there "says that they have sitters day and night with her parents.  Said he had been taking his morning pills every day because he remembers those. However, regarding the medicines that he usually takes later in the day--those are the ones that he has often been forgetting to take. That day I reviewed with him the importance of Coumadin and that this is one of the very most important medicines he is on. Reminded him of his A. fib and fact that he can form clot in his heart which can then go to his brain and cause major stroke.   At last 2-3 OVs, I reviewed that the last PSA was elevated and that I referred to urology but he had deferred secondary to cost. Again today he still says that he cannot afford to pay the co-pay to go to urology. I explained to him that there can be multiple causes for elevated PSA but one of those causes is prostate cancer and the necessity for  follow-up.  10/01/2014--Had routine Ov and labs--Creatinine was up to 1.36.  On that lab result, I said to stop metformin and to decrease Januvia from 100 to 50 mg. However on the result note back from the nurse it was reported that the Januvia was decreased to 50 mg but there was no mention of the metformin. Today patient states that he is still taking the metformin. States that he did decrease Januvia to 50 mg.  10/14/2014--he had office visit with me with symptoms consistent with unstable angina. Was transported to the ER via  EMS. 10/16/2014 he underwent cardiac catheterization. PCI/stent DES to the RCA. He had follow-up with Cardiology-- Rosaria Ferries PA 11/03/14. He is to take aspirin 81 mg, Plavix 75, and Coumadin for 1 month. After 1 month-- he is to stop the aspirin.  11/19/2014-- he reports that he stopped the trazodone because it made him feel drowsy and groggy. Says that he is still having problems waking up in the night. Is no difficulty falling asleep but has trouble staying asleep. Wakes up 3 or 4 times per night. Says he has never tried Ambien. At that visit I prescribed Ambien 10 mg.  01/04/2015: He states that the Ambien 10 mg made him feel groggy the following day. Cause no other adverse effects. Says that he checks his blood sugar 2 or 3 times a week at different times of day. If not checked it fasting recently. Says that last night at bedtime reading was 98. Says most readings he gets her about 1:30 or 140 and at those are just different times a day but not fasting. Says that he has appointment scheduled for urology appointment to follow-up elevated PSA No complaints or concerns today. I reviewed that his INR was elevated today and asked if it's possible that he may have taken an extra dose he says yes that the possible--- I discussed getting a pillbox so that he can pick them in for each day of the week so he will know whether he has taken that day's dose or not. He is taking blood  pressure medication as directed. No lightheadedness. No adverse effects. He is taking cholesterol medication as directed. No myalgias or other adverse effects. He feels that his mood is stable on current medications.    Past Medical History  Diagnosis Date  . Persistent atrial fibrillation     recurrent atrial flutter since 2001 s/p DCCVs, multiple failed AADs, h/o tachy-mediated cardiomyopathy  . Hypertension     with hypertensive heart disease  . Hyperlipidemia   . Atrial flutter     radiofrequency ablation in 2001  . Obesity   . Chronic anticoagulation     chronic Coumadin anticoagulation  . Tobacco abuse   . Chronic obstructive pulmonary disease 04/20/2011  . GERD (gastroesophageal reflux disease)   . CAD (coronary artery disease)     a. Nonobstructive. Cardiac cath in 2001-50% mid RI, normal LM, LAD, RCA b. cath 10/16/2014 95% mid RCA treated with DES, 99% ost D1 medical management due to small aneurysmal segment  . Shortness of breath     "can come on at any time" (03/14/2013)  . Sleep apnea     "dx'd; couldn't wear the mask" (03/14/2013)  . Diabetes mellitus, type 2   . Arthritis     "knees and lower back" (03/14/2013)     Home Meds:  Outpatient Prescriptions Prior to Visit  Medication Sig Dispense Refill  . ADVAIR DISKUS 250-50 MCG/DOSE AEPB TAKE 1 INHALATION BY MOUTH TWICE DAILY INTO LUNGS 60 each 4  . amLODipine (NORVASC) 5 MG tablet Take 1 tablet (5 mg total) by mouth daily. 30 tablet 5  . buPROPion (WELLBUTRIN SR) 150 MG 12 hr tablet TAKE 1 TABLET BY MOUTH TWICE DAILY. 60 tablet 2  . celecoxib (CELEBREX) 200 MG capsule TAKE 1 CAPSULE BY MOUTH EVERY DAY 30 capsule 4  . clopidogrel (PLAVIX) 75 MG tablet Take 1 tablet (75 mg total) by mouth daily. 90 tablet 3  . CRESTOR 20 MG tablet TAKE 1 TABLET BY MOUTH EVERY DAY BEFORE  BEDTIME 30 tablet 5  . escitalopram (LEXAPRO) 10 MG tablet Take 10 mg by mouth daily.     . fish oil-omega-3 fatty acids 1000 MG capsule Take 1 g by  mouth daily.      . furosemide (LASIX) 20 MG tablet TAKE 1 TABLET BY MOUTH EVERY DAY 30 tablet 5  . isosorbide mononitrate (IMDUR) 30 MG 24 hr tablet TAKE ONE TABLET BY MOUTH ONCE DAILY. 30 tablet 2  . lisinopril (PRINIVIL,ZESTRIL) 20 MG tablet TAKE ONE TABLET BY MOUTH ONCE DAILY. 30 tablet 2  . metFORMIN (GLUCOPHAGE) 500 MG tablet TAKE 1 TABLET BY MOUTH TWICE DAILY WITH A MEAL. 60 tablet 2  . metoprolol succinate (TOPROL-XL) 50 MG 24 hr tablet TAKE 3 TABLETS (150MG ) BY MOUTH TWICE DAILY 180 tablet 5  . nitroGLYCERIN (NITROSTAT) 0.4 MG SL tablet Place 1 tablet (0.4 mg total) under the tongue every 5 (five) minutes as needed for chest pain. 25 tablet 1  . pantoprazole (PROTONIX) 40 MG tablet TAKE ONE TABLET BY MOUTH DAILY. 30 tablet 2  . warfarin (COUMADIN) 5 MG tablet TAKE 1 TABLET (5MG ) BY MOUTH ON MONDAY, WEDNESDAY,FRIDAY AND TAKE 1/2 TABLET (2.5MG ) BY MOUTH ON SUNDAY, TUESDAY, THURSDAY, AND SATURDAY 30 tablet 3  . sitaGLIPtin (JANUVIA) 50 MG tablet Take 1 tablet (50 mg total) by mouth daily. 30 tablet 5  . aspirin EC 81 MG EC tablet Take 1 tablet (81 mg total) by mouth daily. For 1 month (Patient not taking: Reported on 12/03/2014)    . zolpidem (AMBIEN) 10 MG tablet Take 1 tablet (10 mg total) by mouth at bedtime as needed for sleep. 30 tablet 2   No facility-administered medications prior to visit.     Allergies: No Known Allergies  Social History   Social History  . Marital Status: Legally Separated    Spouse Name: N/A  . Number of Children: 1  . Years of Education: N/A   Occupational History  . Unemployed   .     Social History Main Topics  . Smoking status: Former Smoker -- 1.00 packs/day for 42 years    Types: Cigarettes    Quit date: 12/31/2011  . Smokeless tobacco: Never Used  . Alcohol Use: Yes     Comment: 03/14/2013 "stopped drinking back in 2002; never had problem w/it"  . Drug Use: No  . Sexual Activity: Not Currently   Other Topics Concern  . 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          Family History  Problem Relation Age of Onset  . Alzheimer's disease Mother   . Osteoporosis Mother      Review of Systems:  See HPI for pertinent ROS. All other ROS negative.    Physical Exam: Blood pressure 122/70, pulse 80, temperature 97.3 F (36.3 C), temperature source Oral, resp. rate 18, weight 230 lb (104.327 kg)., Body mass index is 31.19 kg/(m^2). General: WNWD WM. Appears in no acute distress. Neck: Supple. No thyromegaly. No lymphadenopathy. No carotid bruits. Lungs: Clear bilaterally to auscultation without wheezes, rales, or rhonchi. Breathing is unlabored. Heart: Irreg rhythm Abdomen: Soft, non-tender, non-distended with normoactive bowel sounds. No hepatomegaly. No rebound/guarding. No obvious abdominal masses. Musculoskeletal:  Strength and tone normal for age. Extremities/Skin: Warm and dry. No clubbing or cyanosis.  No edema. No rashes or suspicious lesions. Neuro: Alert and oriented X 3. Moves all extremities spontaneously. Gait is normal. CNII-XII grossly in tact. Psych:  Responds to questions appropriately with a normal affect. Diabetic foot exam: Inspection is normal.  Sensation is intact and normal.  He has 2+ posterior tibial pulses bilaterally. Trace dorsalis pedis pulses bilaterally.       ASSESSMENT AND PLAN:  64 y.o. year old male with   1-FINANCIAL DISTRESS---SEE # 12 AND # 13 BELOW. ---CANNOT AFFORD COPAY TO SEE GI OR UROLOGY.                                       ----ALSO, SEE HPI---PRIOR TO COMING TO THIS OFFICE, WENT TO [facility] DEPARTMENT    -----WILL HAVE OUR STAFF SEE IF THN OR SOME OTHER ASSISTANCE CAN HELP HIM  11/19/2014---DISCUSSED THIS WITH KIM AGAIN TODAY---- 12/03/2014--discussed with Maudie Mercury again today while patient  still here.  Discussed with patient as well.  Discussed with him that the GI appointment can wait but that the Urology appointment is much more necessary. Discussed with him again if he could possibly pay the $40 co-pay to see urology and he says that he can. Therefore, I have placed another order for referral to urology today. He will save money to pay the $40 co-pay for that visit and then will talk to them about finances and a payment plan etc. 01/04/2015 OV--- he says he does have an appointment scheduled to see urology. Has not yet seen them but does have appointment scheduled.  He states he has Medicare secondary to Disability--cannot use savings cards with Medicare.   2. Chronic anticoagulation---PT/INR today--01/04/2015--- 4.1.  Discussed at office visit. His usual dose is:  5 mg every day except for 2.5 mg on Mondays Wednesdays Fridays.  Told him --for THIS WEEK ONLY---Today (a Monday): Take only 2.5mg  then back to usual dosing.       RECHECK INR in 1 WEEK. I told him that it was showing his blood was too thin and asked if he could've possibly accidentally taken an extra dose of Coumadin, he says that that is very possible. Recommend that he get a pill box and put his medications in there for the week so that he can tell whether he has taken that days' dose yet or not.  3. Atrial fibrillation----------see HPI--He recently sees Cardiology and sees EP-- [name] 4. Atrial flutter Pacemaker   Coronary Artery Disease He underwent cardiac catheterization 03/17/2013.---Was not favorable for PCI. He has had medical treatment of CAD. --Cardiac Cath 10/16/2014--PCI/Stent--DES--RCA    6. Depression Stable/controlled on current medications  7. Insomnia 11/19/2014--- reports that trazodone made him feel drowsy. 11/19/2014 prescribed Ambien 10 mg.#30+2--- follow-up visit 01/04/15 he states that this causes drowsiness the next day but no other adverse effects. 01/04/2015 prescribed Ambien 5 mg to  try.  9. Diabetes mellitus, type 2 10/01/2014--Had routine Ov and labs--Creatinine was up to 1.36.  On that lab result, I said to stop metformin and to decrease Januvia from 100 to 50 mg. However on the result note back from the nurse it was reported that the Januvia was decreased to 50 mg but there was no mention of the metformin. Today patient states that he is still taking the metformin. States that he did decrease Januvia to 50 mg. At [place] 11/19/2014--I reviewed that last creatinine checked at  the hospital was 10/17/14 creatinine was 1.29. At this point will continue the metformin and will continue the Januvia at 50 mg. Will need to monitor creatinine closely at follow-up visit.  At 01/04/2015 OV--reviewed labs from the hospital. 10/17/14 creatinine 1.29. A1c down to 5.9.  -----Therefore will discontinue Januvia Continue metformin for now but will need to closely monitor creatinine Also will recheck A1c at next visit to see whether he even still needs metformin  - Microalbumin, urine--- 10/01/2014   He states that he checks his blood sugar about 3 times per week. Checks at different times of day. Never gets < 80. He is on ACE inhibitor. He is on statin therapy. He had DES stent 10/16/2014--To cont ASA for 1 month then D/C ASA. Cont Plavix and Coumadin. (Even prior to DES stent, was already on Plavix and Coumadin per Cardiology)  Foot exam is stable. Patient states that he had an eye exam about one year ago. Is due to schedule followup and patient plans to schedule this. He was told that exam showed "no diabetic damage " At [place] 10/01/2014 he states that his eye exam is due "right now ". I wrote on his AVS to remind him to call to schedule follow-up eye exam.  He received Pneumovax 23 here 01/05/2011 No further pneumonia vaccine indicated until age 59  10. Hypertension At goal. On ACE inhibitor. BMET normal.   11. Hyperlipidemia At last lab -- LFTs normal.  Lipid Panel drawn during  hospitalization --on 10/17/14--- LDL 59.  Continued Crestor 20 mg.  10. Chronic obstructive pulmonary disease Quit smoking 2013.  11. GASTROESOPHAGEAL REFLUX DISEASE Protonix can be used with Plavix to continue this one.  12. Prostate cancer screening 13. Elevated PSA -PSA was elevated 02/05/2014.--4.91.  I ordered referral to urology. Patient reported that he could not afford the co-pay and did not follow-up with urology.  PSA repeated 06/22/2014---4.71---I had noted for staff to see if  can find any type of assistance program to help cover cost so that he can follow-up with urology.However, this has not happened.  AT [place] 10/01/2014--I am discussing with staff again to investigate options for financial assistance/other services, options to help him.  01/04/2015 OV:  Pt reports that he has not yet seen Urology but does have appointment scheduled with them to follow this up.  70. Screening for colorectal cancer The following is copied form his OV on 06/26/2013:   "He reports that he has never had a colonoscopy. Says in the past he was going to -but that's when he then lost his insurance. He is agreeable to proceed with this and is agreeable for me to do her referral for followup with GI. - Ambulatory referral to Gastroenterology " At f/u OV 11/06/2013 he reports: He cannot even afford the co-pay to see GI for the office visit. His co-pay for that visit was going to be $45. He does not even know what his cost would be for the colonoscopy itself. See # 1 above.   14. Immunizations: He received Pneumovax 23 here in the office on 01/05/2011 No further pneumonia vaccine indicated until age 40. He received tetanus here at our office 01/05/2011 Influenza Vaccine----Given here 04/13/2014 Zostavax: So far, have not even discussed, as cost will likely be prohibitive  Follow-up here in one week to recheck PT/INR. Can repeat A1c on her after 01/17/15----last one was done 10/17/14  Signed, [facility], [place],  [facility] 01/04/2015 1:58 PM

## 2015-01-19 ENCOUNTER — Ambulatory Visit (INDEPENDENT_AMBULATORY_CARE_PROVIDER_SITE_OTHER): Payer: Commercial Managed Care - HMO | Admitting: *Deleted

## 2015-01-19 DIAGNOSIS — I495 Sick sinus syndrome: Secondary | ICD-10-CM | POA: Diagnosis not present

## 2015-01-20 ENCOUNTER — Ambulatory Visit (INDEPENDENT_AMBULATORY_CARE_PROVIDER_SITE_OTHER): Payer: Commercial Managed Care - HMO | Admitting: Physician Assistant

## 2015-01-20 ENCOUNTER — Encounter: Payer: Self-pay | Admitting: Physician Assistant

## 2015-01-20 VITALS — BP 152/96 | HR 84 | Temp 97.4°F | Resp 18 | Wt 232.0 lb

## 2015-01-20 DIAGNOSIS — I481 Persistent atrial fibrillation: Secondary | ICD-10-CM

## 2015-01-20 DIAGNOSIS — Z7901 Long term (current) use of anticoagulants: Secondary | ICD-10-CM | POA: Diagnosis not present

## 2015-01-20 DIAGNOSIS — I4819 Other persistent atrial fibrillation: Secondary | ICD-10-CM

## 2015-01-20 LAB — PT WITH INR/FINGERSTICK
INR, fingerstick: 4 — ABNORMAL HIGH (ref 0.80–1.20)
PT, fingerstick: 48.4 seconds — ABNORMAL HIGH (ref 10.4–12.5)

## 2015-01-20 NOTE — Progress Notes (Signed)
Remote pacemaker transmission.   

## 2015-01-20 NOTE — Progress Notes (Signed)
D   Patient ID: [name] Cook MRN: [id number], DOB: 1950-08-24, 64 y.o. Date of Encounter: @DATE @  Chief Complaint:  Chief Complaint  Patient presents with  . PT/INR check    5 mg M-W-F   1/2 tab other days    HPI: 64 y.o. year old white male  presents  here to check his PT/INR.   He had his first office visit with me on 06/19/2013.   He had been seen in our office in the past by [name].  ----First visit with him was 01/05/2011.  ----He only saw him one other visit and that  was 04/19/2011.  Since then, patient has been seeing [facility].  [facility].  He had been having his PT INR checked at [facility].  However he wanted to start having it checked at our office.  He presented here on 06/19/13 for PT/INR check and also to re- establish primary care here.  He is on Coumadin for history of atrial fibrillation/atrial flutter. Goal INR is 2-3. He is taking 2.5 mg on all days except for 5 mg on Mondays /  Wednesdays /  Fridays. No skipped doses.  No bleeding.  No significant change in dietary intake of Vitamin K containing foods.  No change in medications.  He is taking his diabetic medications as directed. He checks his blood sugar about 3 times per week at different times of day and always gets good readings but never gets low readings--never anything lower than 80.  At [place] 04/13/14--he said he was having diarrhea for months. No abdominal pain--just diarrhea.  IDecreased Metformin--At [place] 06/22/14 pt says diarrhea resolved since decreasing the dose of Metformin.  He is taking blood pressure medications as directed. No lightheadedness. No lower extremity edema.  He is taking his Crestor as directed. No myalgias or other adverse effects.   At [place] 03/09/2014 he says that he did want to discuss one thing. Says that he has been on medications for depression in the past but then he was without insurance for a  while so he had gone off of those medications. He is feeling that he does need to get back on medication for this.  Says that he does not actually have episodes of crying but says that he does feel depressed and feels like he is not really interested in doing things that he used to find pleasure in. He says that there are no unusual stressors going on in his life right now says that things are same as usual but he just doesn't feel right. Feels depressed as above. Also says that it seems to be affecting his sleep and that he will sometimes wake up in the night thinking of things. Says that [name] had him on a medication that he prescribed here but says that he doesn't think that worked very well but cannot remember the name of it. I reviewed his chart and it looks like that was Lexapro. Patient says that he also knows in the past he has been on Wellbutrin at one point and was on Prozac at one point. He says that he remembers that the Wellbutrin worked well for him. At that OV I Rxed Wellbutrin. AT OV 04/13/14-- Pt says he is taking this as directed---now taking BID. He cant tell whether it ishelping much yet or not.   At [place] 09/02/2014--he said that he thought he needed to get on Lexapro that  we have discussed in the past. He said he was taking the Wellbutrin but it did not seem to be controlling his depression symptoms. Michela Pitcher that recently he had not been sleeping very well. Was waking up at about 2 or 3 AM and had hard time getting back to sleep. General did not seem very interested in doing things. Decreased motivation. Decreased pleasure in things that he used to have pleasure with. At that visit he was interested in adding Lexapro to Wellbutrin. Added Lexapro 10 mg daily.  10/01/2014--Today he states that he has noticed no difference since he added the Lexapro. Says that in the past he did feel that the Wellbutrin was helping and feels that he should continue this but says he is not feeling any  additional improvement with adding the Lexapro. He feels that he has a "negative attitude all the time "even when there is nothing to be negative about. Says that he is still having problems with insomnia. Says "last night and went to bed at 10 PM woke up at 2 AM. Rate from 2 AM to 3:30. It did go back to sleep around 3:30 but woke at 6 AM.   At office visit 06/22/14 his INR is subtherapeutic at 1.0. Prior to this, all of his INR checks here have been therapeutic and he has always been very compliant with taking his Coumadin and coming in for INR checks as directed. Has been on current dose of 2.5 all days except for 5 mg on Monday Wednesday Friday for a long time. At that [place] when I confronted him about why his INR is so low-- at first he says he may have skipped a couple of doses.  I discussed that an INR of 1.0 is the same as someone who is not on Coumadin at all.  After further discussion, he said that he has been out-of-town at his girlfriend's parents in [place], [place] and says that it has been "chaos there "says that they have sitters day and night with her parents.  Said he had been taking his morning pills every day because he remembers those. However, regarding the medicines that he usually takes later in the day--those are the ones that he has often been forgetting to take. That day I reviewed with him the importance of Coumadin and that this is one of the very most important medicines he is on. Reminded him of his A. fib and fact that he can form clot in his heart which can then go to his brain and cause major stroke.   At last 2-3 OVs, I reviewed that the last PSA was elevated and that I referred to urology but he had deferred secondary to cost. Again today he still says that he cannot afford to pay the co-pay to go to urology. I explained to him that there can be multiple causes for elevated PSA but one of those causes is prostate cancer and the necessity for follow-up.  10/01/2014--Had  routine Ov and labs--Creatinine was up to 1.36.  On that lab result, I said to stop metformin and to decrease Januvia from 100 to 50 mg. However on the result note back from the nurse it was reported that the Januvia was decreased to 50 mg but there was no mention of the metformin. Today patient states that he is still taking the metformin. States that he did decrease Januvia to 50 mg.  10/14/2014--he had office visit with me with symptoms consistent with unstable angina. Was transported to  the ER via EMS. 10/16/2014 he underwent cardiac catheterization. PCI/stent DES to the RCA. He had follow-up with Cardiology-- Rosaria Ferries PA 11/03/14. He is to take aspirin 81 mg, Plavix 75, and Coumadin for 1 month. After 1 month-- he is to stop the aspirin.  11/19/2014-- he reports that he stopped the trazodone because it made him feel drowsy and groggy. Says that he is still having problems waking up in the night. Is no difficulty falling asleep but has trouble staying asleep. Wakes up 3 or 4 times per night. Says he has never tried Ambien. At that visit I prescribed Ambien 10 mg.  01/04/2015: He states that the Ambien 10 mg made him feel groggy the following day. Cause no other adverse effects. Says that he checks his blood sugar 2 or 3 times a week at different times of day. If not checked it fasting recently. Says that last night at bedtime reading was 98. Says most readings he gets her about 1:30 or 140 and at those are just different times a day but not fasting. Says that he has appointment scheduled for urology appointment to follow-up elevated PSA No complaints or concerns today. I reviewed that his INR was elevated today and asked if it's possible that he may have taken an extra dose he says yes that the possible--- I discussed getting a pillbox so that he can pick them in for each day of the week so he will know whether he has taken that day's dose or not. He is taking blood pressure medication as  directed. No lightheadedness. No adverse effects. He is taking cholesterol medication as directed. No myalgias or other adverse effects. He feels that his mood is stable on current medications.  At That Visit, told him to do the following: Chronic anticoagulation---PT/INR today--01/04/2015--- 4.1.  Discussed at office visit. His usual dose is:  5 mg every day except for 2.5 mg on Mondays Wednesdays Fridays.  Told him --for THIS WEEK ONLY---Today (a Monday): Take only 2.5mg  then back to usual dosing.       RECHECK INR in 1 WEEK. I told him that it was showing his blood was too thin and asked if he could've possibly accidentally taken an extra dose of Coumadin, he says that that is very possible. Recommend that he get a pill box and put his medications in there for the week so that he can tell whether he has taken that days' dose yet or not.  01/20/2015: He reports that he did not get a pillbox. As far as he knows he thinks he has been taking his Coumadin as directed says that it is possible that he may have taken extra dose. Has had no medication changes. No significant change in diet. Has had no bleeding, no melena, no hematochezia.     Past Medical History  Diagnosis Date  . Persistent atrial fibrillation (HCC)     recurrent atrial flutter since 2001 s/p DCCVs, multiple failed AADs, h/o tachy-mediated cardiomyopathy  . Hypertension     with hypertensive heart disease  . Hyperlipidemia   . Atrial flutter ([place])     radiofrequency ablation in 2001  . Obesity   . Chronic anticoagulation     chronic Coumadin anticoagulation  . Tobacco abuse   . Chronic obstructive pulmonary disease ([place]) 04/20/2011  . GERD (gastroesophageal reflux disease)   . CAD (coronary artery disease)     a. Nonobstructive. Cardiac cath in 2001-50% mid RI, normal LM, LAD, RCA b. cath 10/16/2014  95% mid RCA treated with DES, 99% ost D1 medical management due to small aneurysmal segment  . Shortness of breath     "can  come on at any time" (03/14/2013)  . Sleep apnea     "dx'd; couldn't wear the mask" (03/14/2013)  . Diabetes mellitus, type 2 ([place])   . Arthritis     "knees and lower back" (03/14/2013)     Home Meds:  Outpatient Prescriptions Prior to Visit  Medication Sig Dispense Refill  . ADVAIR DISKUS 250-50 MCG/DOSE AEPB TAKE 1 INHALATION BY MOUTH TWICE DAILY INTO LUNGS 60 each 4  . amLODipine (NORVASC) 5 MG tablet Take 1 tablet (5 mg total) by mouth daily. 30 tablet 5  . aspirin EC 81 MG EC tablet Take 1 tablet (81 mg total) by mouth daily. For 1 month    . buPROPion (WELLBUTRIN SR) 150 MG 12 hr tablet TAKE 1 TABLET BY MOUTH TWICE DAILY. 60 tablet 2  . celecoxib (CELEBREX) 200 MG capsule TAKE 1 CAPSULE BY MOUTH EVERY DAY 30 capsule 4  . clopidogrel (PLAVIX) 75 MG tablet Take 1 tablet (75 mg total) by mouth daily. 90 tablet 3  . CRESTOR 20 MG tablet TAKE 1 TABLET BY MOUTH EVERY DAY BEFORE BEDTIME 30 tablet 5  . escitalopram (LEXAPRO) 10 MG tablet Take 10 mg by mouth daily.     . fish oil-omega-3 fatty acids 1000 MG capsule Take 1 g by mouth daily.      . furosemide (LASIX) 20 MG tablet TAKE 1 TABLET BY MOUTH EVERY DAY 30 tablet 5  . isosorbide mononitrate (IMDUR) 30 MG 24 hr tablet TAKE ONE TABLET BY MOUTH ONCE DAILY. 30 tablet 2  . lisinopril (PRINIVIL,ZESTRIL) 20 MG tablet TAKE ONE TABLET BY MOUTH ONCE DAILY. 30 tablet 2  . metFORMIN (GLUCOPHAGE) 500 MG tablet TAKE 1 TABLET BY MOUTH TWICE DAILY WITH A MEAL. 60 tablet 2  . metoprolol succinate (TOPROL-XL) 50 MG 24 hr tablet TAKE 3 TABLETS (150MG ) BY MOUTH TWICE DAILY 180 tablet 5  . nitroGLYCERIN (NITROSTAT) 0.4 MG SL tablet Place 1 tablet (0.4 mg total) under the tongue every 5 (five) minutes as needed for chest pain. 25 tablet 1  . pantoprazole (PROTONIX) 40 MG tablet TAKE ONE TABLET BY MOUTH DAILY. 30 tablet 2  . warfarin (COUMADIN) 5 MG tablet TAKE 1 TABLET (5MG ) BY MOUTH ON MONDAY, WEDNESDAY,FRIDAY AND TAKE 1/2 TABLET (2.5MG ) BY MOUTH ON  SUNDAY, TUESDAY, THURSDAY, AND SATURDAY 30 tablet 3  . zolpidem (AMBIEN) 5 MG tablet Take 1 tablet (5 mg total) by mouth at bedtime as needed for sleep. 15 tablet 1   No facility-administered medications prior to visit.     Allergies: No Known Allergies  Social History   Social History  . Marital Status: Legally Separated    Spouse Name: N/A  . Number of Children: 1  . Years of Education: N/A   Occupational History  . Unemployed   .     Social History Main Topics  . Smoking status: Former Smoker -- 1.00 packs/day for 42 years    Types: Cigarettes    Quit date: 12/31/2011  . Smokeless tobacco: Never Used  . Alcohol Use: Yes     Comment: 03/14/2013 "stopped drinking back in 2002; never had problem w/it"  . Drug Use: No  . Sexual Activity: Not Currently   Other Topics Concern  . 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          Family History  Problem Relation Age of Onset  . Alzheimer's disease Mother   . Osteoporosis Mother      Review of Systems:  See HPI for pertinent ROS. All other ROS negative.    Physical Exam: Blood pressure 152/96, pulse 84, temperature 97.4 F (36.3 C), temperature source Oral, resp. rate 18, weight 232 lb (105.235 kg)., Body mass index is 31.46 kg/(m^2). General: WNWD WM. Appears in no acute distress. Neck: Supple. No thyromegaly. No lymphadenopathy. No carotid bruits. Lungs: Clear bilaterally to auscultation without wheezes, rales, or rhonchi. Breathing is unlabored. Heart: Irreg rhythm Abdomen: Soft, non-tender, non-distended with normoactive bowel sounds. No hepatomegaly. No rebound/guarding. No obvious abdominal masses. Musculoskeletal:  Strength and tone normal for age. Extremities/Skin: Warm and dry. No clubbing or cyanosis. No edema. No rashes or suspicious  lesions. Neuro: Alert and oriented X 3. Moves all extremities spontaneously. Gait is normal. CNII-XII grossly in tact. Psych:  Responds to questions appropriately with a normal affect. Diabetic foot exam: Inspection is normal.  Sensation is intact and normal.  He has 2+ posterior tibial pulses bilaterally. Trace dorsalis pedis pulses bilaterally.       ASSESSMENT AND PLAN:  64 y.o. year old male with  Results for orders placed or performed in visit on 01/20/15  PT with INR/Fingerstick  Result Value Ref Range   PT, fingerstick 48.4 (H) 10.4 - 12.5 seconds   INR, fingerstick 4.0 (H) 0.80 - 1.20   Long term current use of anticoagulant therapy - Plan: PT with INR/Fingerstick  Chronic anticoagulation  Atrial fibrillation  TODAY--01/20/2015----  I told him to go home and take just half of a pill today even that today is a Wednesday. After today, go back to his usual dosing. Usual dosing is 5 mg Monday Wednesday Friday. 2.5 mg other days. Also told him to go get a pillbox right now on his way home from our office and start putting Coumadin out in this so that he can make sure whether he has taken that day's dose already or not. And make sure he is not taking double dose, accidentally.  Schedule  follow-up here in one week to recheck PT/INR in one week.  Chronic anticoagulation-  Atrial fibrillation----------see HPI--He recently sees Cardiology and sees EP-- [name]  Atrial flutter   THE FOLLOWING IS COPIED FROM OV NOTE 01/04/2015---BUT NOT ADDRESSED TODAY--01/20/2015:   1-FINANCIAL DISTRESS---SEE # 12 AND # 13 BELOW. ---CANNOT AFFORD COPAY TO SEE GI OR UROLOGY.                                       ----ALSO, SEE HPI---PRIOR TO COMING TO THIS OFFICE, WENT TO FREE CLINIC AND HEALTH DEPARTMENT    -----WILL HAVE OUR STAFF SEE IF THN OR SOME OTHER ASSISTANCE CAN HELP HIM  11/19/2014---DISCUSSED THIS WITH KIM AGAIN TODAY---- 12/03/2014--discussed with Maudie Mercury again today while patient still  here.  Discussed with patient as well.  Discussed with him that the GI appointment can wait but that the Urology appointment is much more necessary. Discussed with him again if he could possibly pay the $40 co-pay to see urology and he says that he can. Therefore, I have placed another order for referral to urology  today. He will save money to pay the $40 co-pay for that visit and then will talk to them about finances and a payment plan etc. 01/04/2015 OV--- he says he does have an appointment scheduled to see urology. Has not yet seen them but does have appointment scheduled.  He states he has Medicare secondary to Disability--cannot use savings cards with Medicare.   2. Chronic anticoagulation- 3. Atrial fibrillation----------see HPI--He recently sees Cardiology and sees EP-- [name] 4. Atrial flutter Pacemaker   Coronary Artery Disease He underwent cardiac catheterization 03/17/2013.---Was not favorable for PCI. He has had medical treatment of CAD. --Cardiac Cath 10/16/2014--PCI/Stent--DES--RCA    6. Depression Stable/controlled on current medications  7. Insomnia 11/19/2014--- reports that trazodone made him feel drowsy. 11/19/2014 prescribed Ambien 10 mg.#30+2--- follow-up visit 01/04/15 he states that this causes drowsiness the next day but no other adverse effects. 01/04/2015 prescribed Ambien 5 mg to try.  9. Diabetes mellitus, type 2 10/01/2014--Had routine Ov and labs--Creatinine was up to 1.36.  On that lab result, I said to stop metformin and to decrease Januvia from 100 to 50 mg. However on the result note back from the nurse it was reported that the Januvia was decreased to 50 mg but there was no mention of the metformin. Today patient states that he is still taking the metformin. States that he did decrease Januvia to 50 mg. At [place] 11/19/2014--I reviewed that last creatinine checked at the hospital was 10/17/14 creatinine was 1.29. At this point will continue the metformin and will  continue the Januvia at 50 mg. Will need to monitor creatinine closely at follow-up visit.  At 01/04/2015 OV--reviewed labs from the hospital. 10/17/14 creatinine 1.29. A1c down to 5.9.  -----Therefore will discontinue Januvia Continue metformin for now but will need to closely monitor creatinine Also will recheck A1c at next visit to see whether he even still needs metformin  - Microalbumin, urine--- 10/01/2014   He states that he checks his blood sugar about 3 times per week. Checks at different times of day. Never gets < 80. He is on ACE inhibitor. He is on statin therapy. He had DES stent 10/16/2014--To cont ASA for 1 month then D/C ASA. Cont Plavix and Coumadin. (Even prior to DES stent, was already on Plavix and Coumadin per Cardiology)  Foot exam is stable. Patient states that he had an eye exam about one year ago. Is due to schedule followup and patient plans to schedule this. He was told that exam showed "no diabetic damage " At [place] 10/01/2014 he states that his eye exam is due "right now ". I wrote on his AVS to remind him to call to schedule follow-up eye exam.  He received Pneumovax 23 here 01/05/2011 No further pneumonia vaccine indicated until age 35  10. Hypertension At goal. On ACE inhibitor. BMET normal.   11. Hyperlipidemia At last lab -- LFTs normal.  Lipid Panel drawn during hospitalization --on 10/17/14--- LDL 59.  Continued Crestor 20 mg.  10. Chronic obstructive pulmonary disease Quit smoking 2013.  11. GASTROESOPHAGEAL REFLUX DISEASE Protonix can be used with Plavix to continue this one.  12. Prostate cancer screening 13. Elevated PSA -PSA was elevated 02/05/2014.--4.91.  I ordered referral to urology. Patient reported that he could not afford the co-pay and did not follow-up with urology.  PSA repeated 06/22/2014---4.71---I had noted for staff to see if  can find any type of assistance program to help cover cost so that he can follow-up with urology.However, this  has not happened.  AT [place] 10/01/2014--I am discussing with staff again to investigate options for financial assistance/other services, options to help him.  01/04/2015 OV:  Pt reports that he has not yet seen Urology but does have appointment scheduled with them to follow this up.  36. Screening for colorectal cancer The following is copied form his OV on 06/26/2013:   "He reports that he has never had a colonoscopy. Says in the past he was going to -but that's when he then lost his insurance. He is agreeable to proceed with this and is agreeable for me to do her referral for followup with GI. - Ambulatory referral to Gastroenterology " At f/u OV 11/06/2013 he reports: He cannot even afford the co-pay to see GI for the office visit. His co-pay for that visit was going to be $45. He does not even know what his cost would be for the colonoscopy itself. See # 1 above.   14. Immunizations: He received Pneumovax 23 here in the office on 01/05/2011 No further pneumonia vaccine indicated until age 75. He received tetanus here at our office 01/05/2011 Influenza Vaccine----Given here 04/13/2014 Zostavax: So far, have not even discussed, as cost will likely be prohibitive  Follow-up here in one week to recheck PT/INR. Can repeat A1c on her after 01/17/15----last one was done 10/17/14  Signed, Karis Juba, [place], [facility] 01/20/2015 10:50 AM

## 2015-01-22 LAB — CUP PACEART REMOTE DEVICE CHECK
Battery Impedance: 133 Ohm
Battery Remaining Longevity: 124 mo
Battery Voltage: 2.8 V
Brady Statistic AP VP Percent: 88 %
Brady Statistic AP VS Percent: 0 %
Brady Statistic AS VP Percent: 12 %
Brady Statistic AS VS Percent: 0 %
Date Time Interrogation Session: 20161004221838
Lead Channel Impedance Value: 539 Ohm
Lead Channel Impedance Value: 706 Ohm
Lead Channel Pacing Threshold Amplitude: 0.625 V
Lead Channel Pacing Threshold Pulse Width: 0.4 ms
Lead Channel Setting Pacing Amplitude: 2 V
Lead Channel Setting Pacing Amplitude: 3.5 V
Lead Channel Setting Pacing Pulse Width: 0.4 ms
Lead Channel Setting Sensing Sensitivity: 4 mV

## 2015-01-26 ENCOUNTER — Other Ambulatory Visit: Payer: Self-pay | Admitting: Family Medicine

## 2015-01-27 ENCOUNTER — Ambulatory Visit (INDEPENDENT_AMBULATORY_CARE_PROVIDER_SITE_OTHER): Payer: Commercial Managed Care - HMO | Admitting: Physician Assistant

## 2015-01-27 ENCOUNTER — Encounter: Payer: Self-pay | Admitting: Cardiology

## 2015-01-27 ENCOUNTER — Encounter: Payer: Self-pay | Admitting: Physician Assistant

## 2015-01-27 VITALS — BP 110/70 | HR 80 | Temp 98.5°F | Resp 18 | Wt 230.0 lb

## 2015-01-27 DIAGNOSIS — I481 Persistent atrial fibrillation: Secondary | ICD-10-CM

## 2015-01-27 DIAGNOSIS — I4819 Other persistent atrial fibrillation: Secondary | ICD-10-CM

## 2015-01-27 DIAGNOSIS — Z7901 Long term (current) use of anticoagulants: Secondary | ICD-10-CM | POA: Diagnosis not present

## 2015-01-27 LAB — PT WITH INR/FINGERSTICK
INR, fingerstick: 3.6 — ABNORMAL HIGH (ref 0.80–1.20)
PT, fingerstick: 43.7 seconds — ABNORMAL HIGH (ref 10.4–12.5)

## 2015-01-27 MED ORDER — WARFARIN SODIUM 5 MG PO TABS: ORAL_TABLET | ORAL | Status: DC

## 2015-01-27 NOTE — Telephone Encounter (Signed)
Refill appropriate and filled per protocol. 

## 2015-01-27 NOTE — Progress Notes (Signed)
D   Patient ID: [name] Cook MRN: [id number], DOB: 31-Dec-1950, 64 y.o. Date of Encounter: @DATE @  Chief Complaint:  Chief Complaint  Patient presents with  . PT/INR check    HPI: 64 y.o. year old white male  presents  here to check his PT/INR.   He had his first office visit with me on 06/19/2013.   He had been seen in our office in the past by [name].  ----First visit with him was 01/05/2011.  ----He only saw him one other visit and that  was 04/19/2011.  Since then, patient has been seeing [facility].  [facility] also [facility].  He had been having his PT INR checked at [facility].  However he wanted to start having it checked at our office.  He presented here on 06/19/13 for PT/INR check and also to re- establish primary care here.  He is on Coumadin for history of atrial fibrillation/atrial flutter. Goal INR is 2-3. He is taking 2.5 mg on all days except for 5 mg on Mondays /  Wednesdays /  Fridays. No skipped doses.  No bleeding.  No significant change in dietary intake of Vitamin K containing foods.  No change in medications.  He is taking his diabetic medications as directed. He checks his blood sugar about 3 times per week at different times of day and always gets good readings but never gets low readings--never anything lower than 80.  At [place] 04/13/14--he said he was having diarrhea for months. No abdominal pain--just diarrhea.  IDecreased Metformin--At [facility] 06/22/14 pt says diarrhea resolved since decreasing the dose of Metformin.  He is taking blood pressure medications as directed. No lightheadedness. No lower extremity edema.  He is taking his Crestor as directed. No myalgias or other adverse effects.   At [place] 03/09/2014 he says that he did want to discuss one thing. Says that he has been on medications for depression in the past but then he was without insurance for a while so he had gone off of those  medications. He is feeling that he does need to get back on medication for this.  Says that he does not actually have episodes of crying but says that he does feel depressed and feels like he is not really interested in doing things that he used to find pleasure in. He says that there are no unusual stressors going on in his life right now says that things are same as usual but he just doesn't feel right. Feels depressed as above. Also says that it seems to be affecting his sleep and that he will sometimes wake up in the night thinking of things. Says that [name] had him on a medication that he prescribed here but says that he doesn't think that worked very well but cannot remember the name of it. I reviewed his chart and it looks like that was Lexapro. Patient says that he also knows in the past he has been on Wellbutrin at one point and was on Prozac at one point. He says that he remembers that the Wellbutrin worked well for him. At that OV I Rxed Wellbutrin. AT [facility] 04/13/14-- Pt says he is taking this as directed---now taking BID. He cant tell whether it ishelping much yet or not.   At [place] 09/02/2014--he said that he thought he needed to get on Lexapro that we have discussed in the past. He said he was taking the  Wellbutrin but it did not seem to be controlling his depression symptoms. Michela Pitcher that recently he had not been sleeping very well. Was waking up at about 2 or 3 AM and had hard time getting back to sleep. General did not seem very interested in doing things. Decreased motivation. Decreased pleasure in things that he used to have pleasure with. At that visit he was interested in adding Lexapro to Wellbutrin. Added Lexapro 10 mg daily.  10/01/2014--Today he states that he has noticed no difference since he added the Lexapro. Says that in the past he did feel that the Wellbutrin was helping and feels that he should continue this but says he is not feeling any additional improvement with adding the  Lexapro. He feels that he has a "negative attitude all the time "even when there is nothing to be negative about. Says that he is still having problems with insomnia. Says "last night and went to bed at 10 PM woke up at 2 AM. Rate from 2 AM to 3:30. It did go back to sleep around 3:30 but woke at 6 AM.   At office visit 06/22/14 his INR is subtherapeutic at 1.0. Prior to this, all of his INR checks here have been therapeutic and he has always been very compliant with taking his Coumadin and coming in for INR checks as directed. Has been on current dose of 2.5 all days except for 5 mg on Monday Wednesday Friday for a long time. At that [place] when I confronted him about why his INR is so low-- at first he says he may have skipped a couple of doses.  I discussed that an INR of 1.0 is the same as someone who is not on Coumadin at all.  After further discussion, he said that he has been out-of-town at his girlfriend's parents in [place], [place] and says that it has been "chaos there "says that they have sitters day and night with her parents.  Said he had been taking his morning pills every day because he remembers those. However, regarding the medicines that he usually takes later in the day--those are the ones that he has often been forgetting to take. That day I reviewed with him the importance of Coumadin and that this is one of the very most important medicines he is on. Reminded him of his A. fib and fact that he can form clot in his heart which can then go to his brain and cause major stroke.   At last 2-3 OVs, I reviewed that the last PSA was elevated and that I referred to urology but he had deferred secondary to cost. Again today he still says that he cannot afford to pay the co-pay to go to urology. I explained to him that there can be multiple causes for elevated PSA but one of those causes is prostate cancer and the necessity for follow-up.  10/01/2014--Had routine Ov and labs--Creatinine was up  to 1.36.  On that lab result, I said to stop metformin and to decrease Januvia from 100 to 50 mg. However on the result note back from the nurse it was reported that the Januvia was decreased to 50 mg but there was no mention of the metformin. Today patient states that he is still taking the metformin. States that he did decrease Januvia to 50 mg.  10/14/2014--he had office visit with me with symptoms consistent with unstable angina. Was transported to the ER via EMS. 10/16/2014 he underwent cardiac catheterization. PCI/stent DES to  the RCA. He had follow-up with Cardiology-- Rosaria Ferries PA 11/03/14. He is to take aspirin 81 mg, Plavix 75, and Coumadin for 1 month. After 1 month-- he is to stop the aspirin.  11/19/2014-- he reports that he stopped the trazodone because it made him feel drowsy and groggy. Says that he is still having problems waking up in the night. Is no difficulty falling asleep but has trouble staying asleep. Wakes up 3 or 4 times per night. Says he has never tried Ambien. At that visit I prescribed Ambien 10 mg.  01/04/2015: He states that the Ambien 10 mg made him feel groggy the following day. Cause no other adverse effects. Says that he checks his blood sugar 2 or 3 times a week at different times of day. If not checked it fasting recently. Says that last night at bedtime reading was 98. Says most readings he gets her about 1:30 or 140 and at those are just different times a day but not fasting. Says that he has appointment scheduled for urology appointment to follow-up elevated PSA No complaints or concerns today. I reviewed that his INR was elevated today and asked if it's possible that he may have taken an extra dose he says yes that the possible--- I discussed getting a pillbox so that he can pick them in for each day of the week so he will know whether he has taken that day's dose or not. He is taking blood pressure medication as directed. No lightheadedness. No adverse  effects. He is taking cholesterol medication as directed. No myalgias or other adverse effects. He feels that his mood is stable on current medications.  At That Visit, told him to do the following: Chronic anticoagulation---PT/INR today--01/04/2015--- 4.1.  Discussed at office visit. His usual dose is:  5 mg every day except for 2.5 mg on Mondays Wednesdays Fridays.  Told him --for THIS WEEK ONLY---Today (a Monday): Take only 2.5mg  then back to usual dosing.       RECHECK INR in 1 WEEK. I told him that it was showing his blood was too thin and asked if he could've possibly accidentally taken an extra dose of Coumadin, he says that that is very possible. Recommend that he get a pill box and put his medications in there for the week so that he can tell whether he has taken that days' dose yet or not.  01/20/2015: He reports that he did not get a pillbox. As far as he knows he thinks he has been taking his Coumadin as directed says that it is possible that he may have taken extra dose. Has had no medication changes. No significant change in diet. Has had no bleeding, no melena, no hematochezia. TODAY--01/20/2015----  I told him to go home and take just half of a pill today even that today is a Wednesday. After today, go back to his usual dosing. Usual dosing is 5 mg Monday Wednesday Friday. 2.5 mg other days. Also told him to go get a pillbox right now on his way home from our office and start putting Coumadin out in this so that he can make sure whether he has taken that day's dose already or not. And make sure he is not taking double dose, accidentally.  Schedule  follow-up here in one week to recheck PT/INR in one week.  01/27/15: He states that he did get a pillbox. He states that he has been taking his Coumadin as directed at last visit 01/20/15 which is  documented above. He has had no bleeding. No change in medications.He has not skipped any dosing. No complaints or concerns  today.    Past Medical History  Diagnosis Date  . Persistent atrial fibrillation (HCC)     recurrent atrial flutter since 2001 s/p DCCVs, multiple failed AADs, h/o tachy-mediated cardiomyopathy  . Hypertension     with hypertensive heart disease  . Hyperlipidemia   . Atrial flutter ([place])     radiofrequency ablation in 2001  . Obesity   . Chronic anticoagulation     chronic Coumadin anticoagulation  . Tobacco abuse   . Chronic obstructive pulmonary disease ([place]) 04/20/2011  . GERD (gastroesophageal reflux disease)   . CAD (coronary artery disease)     a. Nonobstructive. Cardiac cath in 2001-50% mid RI, normal LM, LAD, RCA b. cath 10/16/2014 95% mid RCA treated with DES, 99% ost D1 medical management due to small aneurysmal segment  . Shortness of breath     "can come on at any time" (03/14/2013)  . Sleep apnea     "dx'd; couldn't wear the mask" (03/14/2013)  . Diabetes mellitus, type 2 ([place])   . Arthritis     "knees and lower back" (03/14/2013)     Home Meds:  Outpatient Prescriptions Prior to Visit  Medication Sig Dispense Refill  . ADVAIR DISKUS 250-50 MCG/DOSE AEPB TAKE 1 INHALATION BY MOUTH TWICE DAILY INTO LUNGS 60 each 4  . amLODipine (NORVASC) 5 MG tablet Take 1 tablet (5 mg total) by mouth daily. 30 tablet 5  . aspirin EC 81 MG EC tablet Take 1 tablet (81 mg total) by mouth daily. For 1 month    . buPROPion (WELLBUTRIN SR) 150 MG 12 hr tablet TAKE 1 TABLET BY MOUTH TWICE DAILY. 60 tablet 2  . celecoxib (CELEBREX) 200 MG capsule TAKE 1 CAPSULE BY MOUTH EVERY DAY 30 capsule 4  . clopidogrel (PLAVIX) 75 MG tablet Take 1 tablet (75 mg total) by mouth daily. 90 tablet 3  . CRESTOR 20 MG tablet TAKE 1 TABLET BY MOUTH EVERY DAY BEFORE BEDTIME 30 tablet 5  . escitalopram (LEXAPRO) 10 MG tablet Take 10 mg by mouth daily.     . fish oil-omega-3 fatty acids 1000 MG capsule Take 1 g by mouth daily.      . furosemide (LASIX) 20 MG tablet TAKE 1 TABLET BY MOUTH EVERY DAY 30 tablet 5   . isosorbide mononitrate (IMDUR) 30 MG 24 hr tablet TAKE ONE TABLET BY MOUTH ONCE DAILY. 30 tablet 2  . lisinopril (PRINIVIL,ZESTRIL) 20 MG tablet TAKE ONE TABLET BY MOUTH ONCE DAILY. 30 tablet 2  . metFORMIN (GLUCOPHAGE) 500 MG tablet TAKE 1 TABLET BY MOUTH TWICE DAILY WITH A MEAL. 60 tablet 2  . metoprolol succinate (TOPROL-XL) 50 MG 24 hr tablet TAKE 3 TABLETS (150MG ) BY MOUTH TWICE DAILY 180 tablet 5  . nitroGLYCERIN (NITROSTAT) 0.4 MG SL tablet Place 1 tablet (0.4 mg total) under the tongue every 5 (five) minutes as needed for chest pain. 25 tablet 1  . pantoprazole (PROTONIX) 40 MG tablet TAKE ONE TABLET BY MOUTH DAILY. 30 tablet 2  . zolpidem (AMBIEN) 5 MG tablet Take 1 tablet (5 mg total) by mouth at bedtime as needed for sleep. 15 tablet 1  . warfarin (COUMADIN) 5 MG tablet TAKE 1 TABLET BY MOUTH ON MON, WED, FRI AND 1/2 TABLET ON SUN, TUES, THURS & SAT. 30 tablet 0   No facility-administered medications prior to visit.     Allergies:  No Known Allergies  Social History   Social History  . Marital Status: Legally Separated    Spouse Name: N/A  . Number of Children: 1  . Years of Education: N/A   Occupational History  . Unemployed   .     Social History Main Topics  . Smoking status: Former Smoker -- 1.00 packs/day for 42 years    Types: Cigarettes    Quit date: 12/31/2011  . Smokeless tobacco: Never Used  . Alcohol Use: Yes     Comment: 03/14/2013 "stopped drinking back in 2002; never had problem w/it"  . Drug Use: No  . Sexual Activity: Not Currently   Other Topics Concern  . 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          Family History  Problem Relation Age of Onset  . Alzheimer's disease Mother   . Osteoporosis Mother      Review of Systems:  See HPI for pertinent ROS. All other  ROS negative.    Physical Exam: Blood pressure 110/70, pulse 80, temperature 98.5 F (36.9 C), temperature source Oral, resp. rate 18, weight 230 lb (104.327 kg)., Body mass index is 31.19 kg/(m^2). General: WNWD WM. Appears in no acute distress. Neck: Supple. No thyromegaly. No lymphadenopathy. No carotid bruits. Lungs: Clear bilaterally to auscultation without wheezes, rales, or rhonchi. Breathing is unlabored. Heart: Irreg rhythm Abdomen: Soft, non-tender, non-distended with normoactive bowel sounds. No hepatomegaly. No rebound/guarding. No obvious abdominal masses. Musculoskeletal:  Strength and tone normal for age. Extremities/Skin: Warm and dry. No clubbing or cyanosis. No edema. No rashes or suspicious lesions. Neuro: Alert and oriented X 3. Moves all extremities spontaneously. Gait is normal. CNII-XII grossly in tact. Psych:  Responds to questions appropriately with a normal affect. Diabetic foot exam: Inspection is normal.  Sensation is intact and normal.  He has 2+ posterior tibial pulses bilaterally. Trace dorsalis pedis pulses bilaterally.       ASSESSMENT AND PLAN:  64 y.o. year old male with   Long term current use of anticoagulant therapy - Plan: PT with INR/Fingerstick  Chronic anticoagulation  Atrial fibrillation  TODAY--01/27/2015----  Today he reports he has been using pillbox and nose that he has been taking Coumadin as directed with no skip doses. Today INR is 3.6. Since the last several INRs have been elevated, I am going to go ahead and change his Coumadin dosing in general.  I had been reluctant to change it in general--- because up until recently, his INRs had all been therapeutic-- on the same dose for a long time. However, at this point will change from 5 mg--- one on Monday Wednesday Friday. 1/2 on other days And will change to:  5 mg on Mondays and Fridays only. Take half pill which is 2.5 mg on all other days Went ahead and changed this on his  medication order in epic. Reviewed this with him and he voices understanding and agrees. Recheck PT/INR 2 weeks.   Chronic anticoagulation-  Atrial fibrillation----------see HPI--He recently sees Cardiology and sees EP-- [name]  Atrial flutter   THE FOLLOWING IS COPIED FROM OV NOTE 01/04/2015---BUT NOT ADDRESSED TODAY--01/20/2015:   1-FINANCIAL DISTRESS---SEE # 12 AND # 13 BELOW. ---CANNOT AFFORD COPAY TO SEE GI OR UROLOGY.                                       ----  ALSO, SEE HPI---PRIOR TO COMING TO THIS OFFICE, WENT TO FREE CLINIC AND HEALTH DEPARTMENT    -----WILL HAVE OUR STAFF SEE IF THN OR SOME OTHER ASSISTANCE CAN HELP HIM  11/19/2014---DISCUSSED THIS WITH KIM AGAIN TODAY---- 12/03/2014--discussed with Maudie Mercury again today while patient still here.  Discussed with patient as well.  Discussed with him that the GI appointment can wait but that the Urology appointment is much more necessary. Discussed with him again if he could possibly pay the $40 co-pay to see urology and he says that he can. Therefore, I have placed another order for referral to urology today. He will save money to pay the $40 co-pay for that visit and then will talk to them about finances and a payment plan etc. 01/04/2015 OV--- he says he does have an appointment scheduled to see urology. Has not yet seen them but does have appointment scheduled.  He states he has Medicare secondary to Disability--cannot use savings cards with Medicare.   2. Chronic anticoagulation- 3. Atrial fibrillation----------see HPI--He recently sees Cardiology and sees EP-- [name] 4. Atrial flutter Pacemaker   Coronary Artery Disease He underwent cardiac catheterization 03/17/2013.---Was not favorable for PCI. He has had medical treatment of CAD. --Cardiac Cath 10/16/2014--PCI/Stent--DES--RCA    6. Depression Stable/controlled on current medications  7. Insomnia 11/19/2014--- reports that trazodone made him feel drowsy. 11/19/2014 prescribed  Ambien 10 mg.#30+2--- follow-up visit 01/04/15 he states that this causes drowsiness the next day but no other adverse effects. 01/04/2015 prescribed Ambien 5 mg to try.  9. Diabetes mellitus, type 2 10/01/2014--Had routine Ov and labs--Creatinine was up to 1.36.  On that lab result, I said to stop metformin and to decrease Januvia from 100 to 50 mg. However on the result note back from the nurse it was reported that the Januvia was decreased to 50 mg but there was no mention of the metformin. Today patient states that he is still taking the metformin. States that he did decrease Januvia to 50 mg. At [place] 11/19/2014--I reviewed that last creatinine checked at the hospital was 10/17/14 creatinine was 1.29. At this point will continue the metformin and will continue the Januvia at 50 mg. Will need to monitor creatinine closely at follow-up visit.  At 01/04/2015 OV--reviewed labs from the hospital. 10/17/14 creatinine 1.29. A1c down to 5.9.  -----Therefore will discontinue Januvia Continue metformin for now but will need to closely monitor creatinine Also will recheck A1c at next visit to see whether he even still needs metformin  - Microalbumin, urine--- 10/01/2014   He states that he checks his blood sugar about 3 times per week. Checks at different times of day. Never gets < 80. He is on ACE inhibitor. He is on statin therapy. He had DES stent 10/16/2014--To cont ASA for 1 month then D/C ASA. Cont Plavix and Coumadin. (Even prior to DES stent, was already on Plavix and Coumadin per Cardiology)  Foot exam is stable. Patient states that he had an eye exam about one year ago. Is due to schedule followup and patient plans to schedule this. He was told that exam showed "no diabetic damage " At [place] 10/01/2014 he states that his eye exam is due "right now ". I wrote on his AVS to remind him to call to schedule follow-up eye exam.  He received Pneumovax 23 here 01/05/2011 No further pneumonia vaccine indicated  until age 36  10. Hypertension At goal. On ACE inhibitor. BMET normal.   11. Hyperlipidemia At last lab --  LFTs normal.  Lipid Panel drawn during hospitalization --on 10/17/14--- LDL 59.  Continued Crestor 20 mg.  10. Chronic obstructive pulmonary disease Quit smoking 2013.  11. GASTROESOPHAGEAL REFLUX DISEASE Protonix can be used with Plavix to continue this one.  12. Prostate cancer screening 13. Elevated PSA -PSA was elevated 02/05/2014.--4.91.  I ordered referral to urology. Patient reported that he could not afford the co-pay and did not follow-up with urology.  PSA repeated 06/22/2014---4.71---I had noted for staff to see if  can find any type of assistance program to help cover cost so that he can follow-up with urology.However, this has not happened.  AT [place] 10/01/2014--I am discussing with staff again to investigate options for financial assistance/other services, options to help him.  01/04/2015 OV:  Pt reports that he has not yet seen Urology but does have appointment scheduled with them to follow this up.  1. Screening for colorectal cancer The following is copied form his OV on 06/26/2013:   "He reports that he has never had a colonoscopy. Says in the past he was going to -but that's when he then lost his insurance. He is agreeable to proceed with this and is agreeable for me to do her referral for followup with GI. - Ambulatory referral to Gastroenterology " At f/u OV 11/06/2013 he reports: He cannot even afford the co-pay to see GI for the office visit. His co-pay for that visit was going to be $45. He does not even know what his cost would be for the colonoscopy itself. See # 1 above.   14. Immunizations: He received Pneumovax 23 here in the office on 01/05/2011 No further pneumonia vaccine indicated until age 89. He received tetanus here at our office 01/05/2011 Influenza Vaccine----Given here 04/13/2014 Zostavax: So far, have not even discussed, as cost will likely be  prohibitive  Follow-up here in one week to recheck PT/INR. Can repeat A1c on her after 01/17/15----last one was done 10/17/14  Signed, [facility], [place], [facility] 01/27/2015 10:25 AM

## 2015-02-08 ENCOUNTER — Encounter: Payer: Self-pay | Admitting: Internal Medicine

## 2015-02-10 ENCOUNTER — Encounter: Payer: Self-pay | Admitting: Cardiology

## 2015-02-10 ENCOUNTER — Ambulatory Visit (INDEPENDENT_AMBULATORY_CARE_PROVIDER_SITE_OTHER): Payer: Commercial Managed Care - HMO | Admitting: Physician Assistant

## 2015-02-10 ENCOUNTER — Encounter: Payer: Self-pay | Admitting: Physician Assistant

## 2015-02-10 VITALS — BP 110/80 | HR 88 | Temp 97.4°F | Resp 18 | Wt 232.0 lb

## 2015-02-10 DIAGNOSIS — Z7901 Long term (current) use of anticoagulants: Secondary | ICD-10-CM

## 2015-02-10 DIAGNOSIS — G4700 Insomnia, unspecified: Secondary | ICD-10-CM

## 2015-02-10 DIAGNOSIS — I4819 Other persistent atrial fibrillation: Secondary | ICD-10-CM

## 2015-02-10 DIAGNOSIS — I481 Persistent atrial fibrillation: Secondary | ICD-10-CM | POA: Diagnosis not present

## 2015-02-10 LAB — PT WITH INR/FINGERSTICK
INR, fingerstick: 4.7 — ABNORMAL HIGH (ref 0.80–1.20)
PT, fingerstick: 56.2 seconds — ABNORMAL HIGH (ref 10.4–12.5)

## 2015-02-10 MED ORDER — ZOLPIDEM TARTRATE ER 12.5 MG PO TBCR: 12.5000 mg | EXTENDED_RELEASE_TABLET | Freq: Every evening | ORAL | Status: DC | PRN

## 2015-02-10 NOTE — Progress Notes (Signed)
Patient ID: [name] Cook MRN: [id number], DOB: 1950/09/12, 64 y.o. Date of Encounter: 02/10/2015, 10:59 AM    Chief Complaint:  Chief Complaint  Patient presents with  . PT/INR    5 mg M-W-F, 1/2 tab other days     HPI: 63 y.o. year old white male here for follow-up PT/INR check.  At his last visit I had planned for him to change the dosing long-term from 5 mg Monday Wednesday Friday to 5 mg on Mondays and Fridays only. Therefore I changed it in epic changed it on the medication list. However today he says that he thought that that was only for that 1 week and then he was to return to 5 mg on Monday Wednesday and Friday. Says that that's what he did.  Also states that the Ambien 5 mg is not getting him a full night sleep. This is causing no adverse effects. However he is still waking up at 4 AM and is wide awake.  He has had no bleeding and no bleeding gums when he brushes his teeth etc.     Home Meds:   Outpatient Prescriptions Prior to Visit  Medication Sig Dispense Refill  . ADVAIR DISKUS 250-50 MCG/DOSE AEPB TAKE 1 INHALATION BY MOUTH TWICE DAILY INTO LUNGS 60 each 4  . amLODipine (NORVASC) 5 MG tablet Take 1 tablet (5 mg total) by mouth daily. 30 tablet 5  . aspirin EC 81 MG EC tablet Take 1 tablet (81 mg total) by mouth daily. For 1 month    . buPROPion (WELLBUTRIN SR) 150 MG 12 hr tablet TAKE 1 TABLET BY MOUTH TWICE DAILY. 60 tablet 2  . celecoxib (CELEBREX) 200 MG capsule TAKE 1 CAPSULE BY MOUTH EVERY DAY 30 capsule 4  . clopidogrel (PLAVIX) 75 MG tablet Take 1 tablet (75 mg total) by mouth daily. 90 tablet 3  . CRESTOR 20 MG tablet TAKE 1 TABLET BY MOUTH EVERY DAY BEFORE BEDTIME 30 tablet 5  . escitalopram (LEXAPRO) 10 MG tablet Take 10 mg by mouth daily.     . fish oil-omega-3 fatty acids 1000 MG capsule Take 1 g by mouth daily.      . furosemide (LASIX) 20 MG tablet TAKE 1 TABLET BY MOUTH EVERY DAY 30 tablet 5  . isosorbide mononitrate (IMDUR) 30 MG 24 hr tablet  TAKE ONE TABLET BY MOUTH ONCE DAILY. 30 tablet 2  . lisinopril (PRINIVIL,ZESTRIL) 20 MG tablet TAKE ONE TABLET BY MOUTH ONCE DAILY. 30 tablet 2  . metFORMIN (GLUCOPHAGE) 500 MG tablet TAKE 1 TABLET BY MOUTH TWICE DAILY WITH A MEAL. 60 tablet 2  . metoprolol succinate (TOPROL-XL) 50 MG 24 hr tablet TAKE 3 TABLETS (150MG ) BY MOUTH TWICE DAILY 180 tablet 5  . nitroGLYCERIN (NITROSTAT) 0.4 MG SL tablet Place 1 tablet (0.4 mg total) under the tongue every 5 (five) minutes as needed for chest pain. 25 tablet 1  . pantoprazole (PROTONIX) 40 MG tablet TAKE ONE TABLET BY MOUTH DAILY. 30 tablet 2  . warfarin (COUMADIN) 5 MG tablet Take 1 tablet on Mondays and Fridays. Take 1/2 tablet all other days. (Patient taking differently: Take 1 tablet on M-W-F. Take 1/2 tablet all other days.) 30 tablet 5  . zolpidem (AMBIEN) 5 MG tablet Take 1 tablet (5 mg total) by mouth at bedtime as needed for sleep. 15 tablet 1   No facility-administered medications prior to visit.    Allergies: No Known Allergies    Review of Systems: See HPI for  pertinent ROS. All other ROS negative.    Physical Exam: Blood pressure 110/80, pulse 88, temperature 97.4 F (36.3 C), temperature source Oral, resp. rate 18, weight 232 lb (105.235 kg)., Body mass index is 31.46 kg/(m^2). General:  WNWD WM. Appears in no acute distress. Neck: Supple. No thyromegaly. No lymphadenopathy. Lungs: Clear bilaterally to auscultation without wheezes, rales, or rhonchi. Breathing is unlabored. Heart: Regular rhythm. No murmurs, rubs, or gallops. Msk:  Strength and tone normal for age. Extremities/Skin: Warm and dry. Neuro: Alert and oriented X 3. Moves all extremities spontaneously. Gait is normal. CNII-XII grossly in tact. Psych:  Responds to questions appropriately with a normal affect.     ASSESSMENT AND PLAN:  64 y.o. year old male with  1. Long term current use of anticoagulant therapy - PT with INR/Fingerstick  2. Chronic  anticoagulation  3. Atrial fibrillation  4. Insomnia - zolpidem (AMBIEN CR) 12.5 MG CR tablet; Take 1 tablet (12.5 mg total) by mouth at bedtime as needed for sleep.  Dispense: 30 tablet; Refill: 0  He is to skip his Coumadin today and take absolutely no Coumadin today. After today he is to change the dosing for long-term to: 5 mg on Mondays and Fridays only and 2.5 mg all other days.  We'll change Ambien to Ambien CR 12.5 mg to see if this will help keep him asleep.  F/U OV in one week to recheck PT/INR.  Signed, [address] [place], [place], [facility] 02/10/2015 10:59 AM

## 2015-02-11 ENCOUNTER — Telehealth: Payer: Self-pay | Admitting: *Deleted

## 2015-02-11 ENCOUNTER — Other Ambulatory Visit: Payer: Self-pay | Admitting: Physician Assistant

## 2015-02-11 ENCOUNTER — Other Ambulatory Visit: Payer: Self-pay | Admitting: Family Medicine

## 2015-02-11 NOTE — Telephone Encounter (Signed)
Refill appropriate and filled per protocol. 

## 2015-02-11 NOTE — Telephone Encounter (Signed)
Received request from pharmacy for PA on Ambien.   PA submitted.   Dx: G47.0 Insomnia.

## 2015-02-16 NOTE — Telephone Encounter (Signed)
Zolpidem ER 12.5 mg has been denied.  Pt must have tried and failed one of the following  Temazepam 15 mg or 30 mg or Zaleplon.  Please advise?

## 2015-02-17 ENCOUNTER — Ambulatory Visit: Payer: Commercial Managed Care - HMO | Admitting: Physician Assistant

## 2015-02-17 MED ORDER — ZOLPIDEM TARTRATE 10 MG PO TABS: 10.0000 mg | ORAL_TABLET | Freq: Every evening | ORAL | Status: AC | PRN

## 2015-02-17 NOTE — Telephone Encounter (Signed)
Pharmacy called and made aware of medication change

## 2015-02-17 NOTE — Telephone Encounter (Signed)
He was using Ambien 5 mg but was waking up at 4 AM. Send prescription for Ambien 10 mg 1 by mouth daily at bedtime #30+2. His insurance must pay for plain Ambien but just not the Ambien CR

## 2015-02-18 ENCOUNTER — Encounter: Payer: Self-pay | Admitting: Physician Assistant

## 2015-02-18 ENCOUNTER — Ambulatory Visit (INDEPENDENT_AMBULATORY_CARE_PROVIDER_SITE_OTHER): Payer: Commercial Managed Care - HMO | Admitting: Physician Assistant

## 2015-02-18 VITALS — BP 114/78 | HR 84 | Temp 97.4°F | Resp 18 | Wt 229.0 lb

## 2015-02-18 DIAGNOSIS — I481 Persistent atrial fibrillation: Secondary | ICD-10-CM | POA: Diagnosis not present

## 2015-02-18 DIAGNOSIS — I4819 Other persistent atrial fibrillation: Secondary | ICD-10-CM

## 2015-02-18 DIAGNOSIS — Z7901 Long term (current) use of anticoagulants: Secondary | ICD-10-CM

## 2015-02-18 LAB — PT WITH INR/FINGERSTICK
INR, fingerstick: 3.1 — ABNORMAL HIGH (ref 0.80–1.20)
PT, fingerstick: 36.8 seconds — ABNORMAL HIGH (ref 10.4–12.5)

## 2015-02-18 NOTE — Progress Notes (Signed)
Patient ID: ROCHESTER SERPE MRN: [id number], DOB: April 17, 1951, 64 y.o. Date of Encounter: 02/18/2015, 10:30 AM    Chief Complaint:  Chief Complaint  Patient presents with  . PT/INR     HPI: 64 y.o. year old male her for f/u of PT/INR.   He has been taking his Coumadin at 2.5 mg every day except for 5 mg only on Mondays and Fridays. He has had no change in other medications. No change in diet regarding intake of vitamin K containing foods. No bleeding.     Home Meds:   Outpatient Prescriptions Prior to Visit  Medication Sig Dispense Refill  . ADVAIR DISKUS 250-50 MCG/DOSE AEPB TAKE 1 INHALATION BY MOUTH TWICE DAILY INTO LUNGS 60 each 4  . amLODipine (NORVASC) 5 MG tablet Take 1 tablet (5 mg total) by mouth daily. 30 tablet 5  . aspirin EC 81 MG EC tablet Take 1 tablet (81 mg total) by mouth daily. For 1 month    . buPROPion (WELLBUTRIN SR) 150 MG 12 hr tablet TAKE 1 TABLET BY MOUTH TWICE DAILY. 60 tablet 2  . celecoxib (CELEBREX) 200 MG capsule TAKE ONE CAPSULE BY MOUTH DAILY. 30 capsule 3  . clopidogrel (PLAVIX) 75 MG tablet Take 1 tablet (75 mg total) by mouth daily. 90 tablet 3  . CRESTOR 20 MG tablet TAKE 1 TABLET BY MOUTH EVERY DAY BEFORE BEDTIME 30 tablet 5  . escitalopram (LEXAPRO) 10 MG tablet Take 10 mg by mouth daily.     . fish oil-omega-3 fatty acids 1000 MG capsule Take 1 g by mouth daily.      . furosemide (LASIX) 20 MG tablet TAKE 1 TABLET BY MOUTH EVERY DAY 30 tablet 5  . isosorbide mononitrate (IMDUR) 30 MG 24 hr tablet TAKE ONE TABLET BY MOUTH ONCE DAILY. 30 tablet 3  . lisinopril (PRINIVIL,ZESTRIL) 20 MG tablet TAKE ONE TABLET BY MOUTH ONCE DAILY. 30 tablet 2  . metFORMIN (GLUCOPHAGE) 500 MG tablet TAKE 1 TABLET BY MOUTH TWICE DAILY WITH A MEAL. 60 tablet 2  . metoprolol succinate (TOPROL-XL) 50 MG 24 hr tablet TAKE 3 TABLETS (150MG ) BY MOUTH TWICE DAILY 180 tablet 5  . nitroGLYCERIN (NITROSTAT) 0.4 MG SL tablet Place 1 tablet (0.4 mg total) under the tongue every  5 (five) minutes as needed for chest pain. 25 tablet 1  . zolpidem (AMBIEN) 10 MG tablet Take 1 tablet (10 mg total) by mouth at bedtime as needed for sleep. 30 tablet 2  . pantoprazole (PROTONIX) 40 MG tablet TAKE ONE TABLET BY MOUTH DAILY. 30 tablet 2  . warfarin (COUMADIN) 5 MG tablet Take 1 tablet on Mondays and Fridays. Take 1/2 tablet all other days. (Patient taking differently: Take 1 tablet on M-F. Take 1/2 tablet all other days.) 30 tablet 5   No facility-administered medications prior to visit.    Allergies: No Known Allergies    Review of Systems: See HPI for pertinent ROS. All other ROS negative.    Physical Exam: Blood pressure 114/78, pulse 84, temperature 97.4 F (36.3 C), temperature source Oral, resp. rate 18, weight 229 lb (103.874 kg)., Body mass index is 31.05 kg/(m^2). General:  WNWD WM. Appears in no acute distress. Neck: Supple. No thyromegaly. No lymphadenopathy. Lungs: Clear bilaterally to auscultation without wheezes, rales, or rhonchi. Breathing is unlabored. Heart: Irregular rhythm. No murmurs, rubs, or gallops. Msk:  Strength and tone normal for age. Extremities/Skin: Warm and dry. No clubbing or cyanosis. No edema. No rashes or suspicious  lesions. Neuro: Alert and oriented X 3. Moves all extremities spontaneously. Gait is normal. CNII-XII grossly in tact. Psych:  Responds to questions appropriately with a normal affect.   Results for orders placed or performed in visit on 02/18/15  PT with INR/Fingerstick  Result Value Ref Range   PT, fingerstick 36.8 (H) 10.4 - 12.5 seconds   INR, fingerstick 3.1 (H) 0.80 - 1.20     ASSESSMENT AND PLAN:  64 y.o. year old male with  1. Long term current use of anticoagulant therapy - PT with INR/Fingerstick  2. Chronic anticoagulation  3. Atrial fibrillation  I told him that we are going to keep him on this dose long-term now:  2.5 mg every day except for 5 mg on Mondays and Fridays only.  Return to recheck  PT/INR 3 weeks.  Signed, [address] [place], [place], [facility] 02/18/2015 10:30 AM

## 2015-03-01 ENCOUNTER — Encounter: Payer: Self-pay | Admitting: Family Medicine

## 2015-03-01 ENCOUNTER — Other Ambulatory Visit: Payer: Self-pay | Admitting: Family Medicine

## 2015-03-01 NOTE — Telephone Encounter (Signed)
Pt over due for routine office visit and fasting lab work.  Letter to patient

## 2015-03-15 ENCOUNTER — Ambulatory Visit: Payer: Commercial Managed Care - HMO | Admitting: Physician Assistant

## 2015-03-15 ENCOUNTER — Other Ambulatory Visit: Payer: Self-pay | Admitting: Physician Assistant

## 2015-03-15 NOTE — Telephone Encounter (Signed)
Medication refilled per protocol. 

## 2015-03-16 ENCOUNTER — Other Ambulatory Visit: Payer: Self-pay | Admitting: Physician Assistant

## 2015-03-16 NOTE — Telephone Encounter (Signed)
Medication refilled per protocol. 

## 2015-03-22 ENCOUNTER — Other Ambulatory Visit: Payer: Self-pay | Admitting: Family Medicine

## 2015-03-22 ENCOUNTER — Ambulatory Visit (INDEPENDENT_AMBULATORY_CARE_PROVIDER_SITE_OTHER): Payer: Commercial Managed Care - HMO | Admitting: Physician Assistant

## 2015-03-22 ENCOUNTER — Encounter: Payer: Self-pay | Admitting: Physician Assistant

## 2015-03-22 VITALS — BP 110/80 | HR 92 | Temp 97.5°F | Resp 18 | Wt 283.0 lb

## 2015-03-22 DIAGNOSIS — I4892 Unspecified atrial flutter: Secondary | ICD-10-CM

## 2015-03-22 DIAGNOSIS — N182 Chronic kidney disease, stage 2 (mild): Secondary | ICD-10-CM | POA: Diagnosis not present

## 2015-03-22 DIAGNOSIS — J438 Other emphysema: Secondary | ICD-10-CM

## 2015-03-22 DIAGNOSIS — E119 Type 2 diabetes mellitus without complications: Secondary | ICD-10-CM

## 2015-03-22 DIAGNOSIS — F329 Major depressive disorder, single episode, unspecified: Secondary | ICD-10-CM | POA: Diagnosis not present

## 2015-03-22 DIAGNOSIS — F32A Depression, unspecified: Secondary | ICD-10-CM

## 2015-03-22 DIAGNOSIS — Z7901 Long term (current) use of anticoagulants: Secondary | ICD-10-CM

## 2015-03-22 DIAGNOSIS — G4700 Insomnia, unspecified: Secondary | ICD-10-CM | POA: Diagnosis not present

## 2015-03-22 DIAGNOSIS — I2583 Coronary atherosclerosis due to lipid rich plaque: Secondary | ICD-10-CM

## 2015-03-22 DIAGNOSIS — R945 Abnormal results of liver function studies: Secondary | ICD-10-CM

## 2015-03-22 DIAGNOSIS — R972 Elevated prostate specific antigen [PSA]: Secondary | ICD-10-CM

## 2015-03-22 DIAGNOSIS — R7989 Other specified abnormal findings of blood chemistry: Secondary | ICD-10-CM

## 2015-03-22 DIAGNOSIS — Z125 Encounter for screening for malignant neoplasm of prostate: Secondary | ICD-10-CM | POA: Diagnosis not present

## 2015-03-22 DIAGNOSIS — I4819 Other persistent atrial fibrillation: Secondary | ICD-10-CM

## 2015-03-22 DIAGNOSIS — I2510 Atherosclerotic heart disease of native coronary artery without angina pectoris: Secondary | ICD-10-CM

## 2015-03-22 DIAGNOSIS — R799 Abnormal finding of blood chemistry, unspecified: Secondary | ICD-10-CM | POA: Diagnosis not present

## 2015-03-22 DIAGNOSIS — E785 Hyperlipidemia, unspecified: Secondary | ICD-10-CM

## 2015-03-22 DIAGNOSIS — I10 Essential (primary) hypertension: Secondary | ICD-10-CM

## 2015-03-22 DIAGNOSIS — Z720 Tobacco use: Secondary | ICD-10-CM

## 2015-03-22 DIAGNOSIS — I481 Persistent atrial fibrillation: Secondary | ICD-10-CM | POA: Diagnosis not present

## 2015-03-22 LAB — PT WITH INR/FINGERSTICK
INR, fingerstick: 3.9 — ABNORMAL HIGH (ref 0.80–1.20)
PT, fingerstick: 47.1 seconds — ABNORMAL HIGH (ref 10.4–12.5)

## 2015-03-22 LAB — COMPLETE METABOLIC PANEL WITH GFR
ALT: 22 U/L (ref 9–46)
AST: 24 U/L (ref 10–35)
Albumin: 4.3 g/dL (ref 3.6–5.1)
Alkaline Phosphatase: 58 U/L (ref 40–115)
BUN: 17 mg/dL (ref 7–25)
CO2: 25 mmol/L (ref 20–31)
Calcium: 9.1 mg/dL (ref 8.6–10.3)
Chloride: 103 mmol/L (ref 98–110)
Creat: 1.28 mg/dL — ABNORMAL HIGH (ref 0.70–1.25)
GFR, Est African American: 69 mL/min (ref >=60)
GFR, Est Non African American: 60 mL/min (ref >=60)
Glucose, Bld: 199 mg/dL — ABNORMAL HIGH (ref 70–99)
Potassium: 4.2 mmol/L (ref 3.5–5.3)
Sodium: 138 mmol/L (ref 135–146)
Total Bilirubin: 0.6 mg/dL (ref 0.2–1.2)
Total Protein: 6.3 g/dL (ref 6.1–8.1)

## 2015-03-22 LAB — HEMOGLOBIN A1C
Hgb A1c MFr Bld: 7.1 % — ABNORMAL HIGH (ref <5.7)
Mean Plasma Glucose: 157 mg/dL — ABNORMAL HIGH (ref <117)

## 2015-03-22 MED ORDER — GLUCOSE BLOOD VI STRP: 1.0000 | ORAL_STRIP | Freq: Every day | Status: DC

## 2015-03-22 MED ORDER — PROMETHAZINE HCL 25 MG PO TABS: 25.0000 mg | ORAL_TABLET | Freq: Three times a day (TID) | ORAL | Status: DC | PRN

## 2015-03-22 MED ORDER — WARFARIN SODIUM 5 MG PO TABS: ORAL_TABLET | ORAL | Status: DC

## 2015-03-22 NOTE — Telephone Encounter (Signed)
Test strips refilled per provider direction

## 2015-03-22 NOTE — Progress Notes (Addendum)
D   Patient ID: [name] Cook MRN: [id number], DOB: [birth year], 64 y.o. Date of Encounter: @DATE @  Chief Complaint:  Chief Complaint  Patient presents with  . PT/INR    HPI: 64 y.o. year old white male  presents for followup office visit. Here to check his PT/INR.  Also for routine OV to f/u HTN, DM etc.  He had his first office visit with me on 06/19/2013.   He had been seen in our office in the past by [name].  ----First visit with him was 01/05/2011.  ----He only saw him one other visit and that  was 04/19/2011.  Since then, patient has been seeing [facility].  [facility].  He had been having his PT INR checked at [facility].  However he wanted to start having it checked at our office.  He presented here on 06/19/13 for PT/INR check and also to re- establish primary care here.  COUMADIN: He is on Coumadin for history of atrial fibrillation/atrial flutter. Goal INR is 2-3. For a long time, his INR was therapeutic taking 2.5 mg on all days except for 5 mg on Mondays /  Wednesdays /  Fridays. However, recently, he has been having elevated INR and dose adjusted.  Now taking 5mg  on Mondays & Fridays. 2.5mg  all other days.  No skipped doses.  No bleeding.  No significant change in dietary intake of Vitamin K containing foods.  No change in medications.   DIABETES: I reviewed my lab result note attached to labs 10/01/14. That result note stated to stop the metformin. Also stated to decrease Januvia from 100 mg to 50 mg. However, today patient states that he is taking metformin just one in the morning only. Says that he is now off of Januvia. Says that the metformin is the only medicine he is currently on for diabetes.. He has not been checking his blood sugar much recently.  At [place] 04/13/14--he said he was having diarrhea for months. No abdominal pain--just diarrhea.  IDecreased Metformin--At OV 06/22/14 pt  says diarrhea resolved since decreasing the dose of Metformin.  HYPERTENSION: He is taking blood pressure medications as directed. No lightheadedness. No lower extremity edema.  HYPERLIPIDEMIA: He is taking his Crestor as directed. No myalgias or other adverse effects.  CAD: He presented to me with an office visit 10/14/14 with complaints consistent with angina. Subsequently was admitted to the hospital and underwent PTCA. As well he had a prior history of CAD and stenting in the past. This is currently stable with no angina symptoms.  DEPRESSION: At [place] 03/09/2014 he says that he did want to discuss one thing. Says that he has been on medications for depression in the past but then he was without insurance for a while so he had gone off of those medications. He is feeling that he does need to get back on medication for this.  Says that he does not actually have episodes of crying but says that he does feel depressed and feels like he is not really interested in doing things that he used to find pleasure in. He says that there are no unusual stressors going on in his life right now says that things are same as usual but he just doesn't feel right. Feels depressed as above. Also says that it seems to be affecting his sleep and that he will sometimes wake up in the night thinking of things. Says that  [name] had him on a medication that he prescribed here but says that he doesn't think that worked very well but cannot remember the name of it. I reviewed his chart and it looks like that was Lexapro. Patient says that he also knows in the past he has been on Wellbutrin at one point and was on Prozac at one point. He says that he remembers that the Wellbutrin worked well for him. At that OV I Rxed Wellbutrin. AT OV 04/13/14-- Pt says he is taking this as directed---now taking BID. He cant tell whether it ishelping much yet or not.   At [place] 09/02/2014--he said that he thought he needed to get on  Lexapro that we have discussed in the past. He said he was taking the Wellbutrin but it did not seem to be controlling his depression symptoms. Michela Pitcher that recently he had not been sleeping very well. Was waking up at about 2 or 3 AM and had hard time getting back to sleep. General did not seem very interested in doing things. Decreased motivation. Decreased pleasure in things that he used to have pleasure with. At that visit he was interested in adding Lexapro to Wellbutrin. Added Lexapro 10 mg daily.  10/01/2014--Today he states that he has noticed no difference since he added the Lexapro. Says that in the past he did feel that the Wellbutrin was helping and feels that he should continue this but says he is not feeling any additional improvement with adding the Lexapro. He feels that he has a "negative attitude all the time "even when there is nothing to be negative about. Says that he is still having problems with insomnia. Says "last night and went to bed at 10 PM woke up at 2 AM. Rate from 2 AM to 3:30. It did go back to sleep around 3:30 but woke at 6 AM. At that visit weaned off of the Lexapro. He is now on Wellbutrin and this is stable.  INSOMNIA: He is now taking Ambien 10 mg daily at bedtime and this is working well for his insomnia.   At last 2-3 OVs, I reviewed that the last PSA was elevated and that I referred to urology but he had deferred secondary to cost.  Finally,  at last visit we did get him scheduled with urology and discussed cost with him. He tells me that he has his appointment with them tomorrow December 6.   Past Medical History  Diagnosis Date  . Persistent atrial fibrillation (HCC)     recurrent atrial flutter since 2001 s/p DCCVs, multiple failed AADs, h/o tachy-mediated cardiomyopathy  . Hypertension     with hypertensive heart disease  . Hyperlipidemia   . Atrial flutter ([place])     radiofrequency ablation in 2001  . Obesity   . Chronic anticoagulation     chronic  Coumadin anticoagulation  . Tobacco abuse   . Chronic obstructive pulmonary disease ([place]) 04/20/2011  . GERD (gastroesophageal reflux disease)   . CAD (coronary artery disease)     a. Nonobstructive. Cardiac cath in 2001-50% mid RI, normal LM, LAD, RCA b. cath 10/16/2014 95% mid RCA treated with DES, 99% ost D1 medical management due to small aneurysmal segment  . Shortness of breath     "can come on at any time" (03/14/2013)  . Sleep apnea     "dx'd; couldn't wear the mask" (03/14/2013)  . Diabetes mellitus, type 2 ([place])   . Arthritis     "knees and lower  back" (03/14/2013)     Home Meds:  Outpatient Prescriptions Prior to Visit  Medication Sig Dispense Refill  . ADVAIR DISKUS 250-50 MCG/DOSE AEPB TAKE 1 INHALATION BY MOUTH TWICE DAILY INTO LUNGS 60 each 4  . amLODipine (NORVASC) 5 MG tablet Take 1 tablet (5 mg total) by mouth daily. 30 tablet 5  . aspirin EC 81 MG EC tablet Take 1 tablet (81 mg total) by mouth daily. For 1 month    . buPROPion (WELLBUTRIN SR) 150 MG 12 hr tablet TAKE 1 TABLET BY MOUTH TWICE DAILY. 60 tablet 2  . celecoxib (CELEBREX) 200 MG capsule TAKE ONE CAPSULE BY MOUTH DAILY. 30 capsule 3  . clopidogrel (PLAVIX) 75 MG tablet Take 1 tablet (75 mg total) by mouth daily. 90 tablet 3  . escitalopram (LEXAPRO) 10 MG tablet Take 10 mg by mouth daily.     . fish oil-omega-3 fatty acids 1000 MG capsule Take 1 g by mouth daily.      . furosemide (LASIX) 20 MG tablet TAKE ONE TABLET BY MOUTH DAILY. 30 tablet 0  . isosorbide mononitrate (IMDUR) 30 MG 24 hr tablet TAKE ONE TABLET BY MOUTH ONCE DAILY. 30 tablet 3  . lisinopril (PRINIVIL,ZESTRIL) 20 MG tablet TAKE ONE TABLET BY MOUTH ONCE DAILY. 30 tablet 0  . metFORMIN (GLUCOPHAGE) 500 MG tablet TAKE 1 TABLET BY MOUTH TWICE DAILY WITH A MEAL. 60 tablet 1  . metoprolol succinate (TOPROL-XL) 50 MG 24 hr tablet TAKE 3 TABLETS (150MG ) BY MOUTH TWICE DAILY 180 tablet 5  . nitroGLYCERIN (NITROSTAT) 0.4 MG SL tablet Place 1 tablet (0.4  mg total) under the tongue every 5 (five) minutes as needed for chest pain. 25 tablet 1  . pantoprazole (PROTONIX) 40 MG tablet TAKE ONE TABLET BY MOUTH DAILY. 90 tablet 1  . rosuvastatin (CRESTOR) 20 MG tablet TAKE ONE TABLET BY MOUTH BEFORE BEDTIME. 30 tablet 0  . warfarin (COUMADIN) 5 MG tablet Take 1 tablet on Mondays and Fridays. Take 1/2 tablet all other days. (Patient taking differently: Take 1 tablet on M-F. Take 1/2 tablet all other days.) 30 tablet 5   No facility-administered medications prior to visit.     Allergies: No Known Allergies  Social History   Social History  . Marital Status: Legally Separated    Spouse Name: N/A  . Number of Children: 1  . Years of Education: N/A   Occupational History  . Unemployed   .     Social History Main Topics  . Smoking status: Former Smoker -- 1.00 packs/day for 42 years    Types: Cigarettes    Quit date: 12/31/2011  . Smokeless tobacco: Never Used  . Alcohol Use: Yes     Comment: 03/14/2013 "stopped drinking back in 2002; never had problem w/it"  . Drug Use: No  . Sexual Activity: Not Currently   Other Topics Concern  . 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          Family History  Problem Relation Age of Onset  . Alzheimer's disease Mother   . Osteoporosis Mother      Review of Systems:  See HPI for pertinent ROS. All other ROS negative.    Physical Exam: Blood pressure 110/80, pulse 92, temperature 97.5 F (36.4 C), temperature source Oral,  resp. rate 18, weight 283 lb (128.368 kg)., Body mass index is 38.37 kg/(m^2). General: WNWD WM. Appears in no acute distress. Neck: Supple. No thyromegaly. No lymphadenopathy. No carotid bruits. Lungs: Clear bilaterally to auscultation without wheezes, rales, or rhonchi. Breathing is  unlabored. Heart: Irreg rhythm Abdomen: Soft, non-tender, non-distended with normoactive bowel sounds. No hepatomegaly. No rebound/guarding. No obvious abdominal masses. Musculoskeletal:  Strength and tone normal for age. Extremities/Skin: Warm and dry. No clubbing or cyanosis. No edema. No rashes or suspicious lesions. Neuro: Alert and oriented X 3. Moves all extremities spontaneously. Gait is normal. CNII-XII grossly in tact. Psych:  Responds to questions appropriately with a normal affect. Diabetic foot exam: Inspection is normal.  Sensation is intact and normal.  He has 2+ posterior tibial pulses bilaterally. Trace dorsalis pedis pulses bilaterally.       ASSESSMENT AND PLAN:  64 y.o. year old male with   FINANCIAL DISTRESS---SEE # 12 AND # 13 BELOW. ---HAS NOT BEEN ABLE TO AFFORD COPAY TO SEE GI OR UROLOGY.  FINALLY, IS GOING TO SEE UROLOGY---Appt 03/23/2015                                      ----ALSO, SEE HPI---PRIOR TO COMING TO THIS OFFICE, WENT TO [facility]    -----WILL HAVE OUR STAFF SEE IF THN OR SOME OTHER ASSISTANCE CAN HELP HIM   ADDENDUM: Received note from Urology dated 03/23/2015:  Their diagnosis includes: Benign localized hyperplasia of prostate with urinary obstruction Intermittent urinary stream Weak urinary stream Nocturia  Their plan includes: Complex uroflowmetry Cystoscopy Prostate ultrasound PSA reflex to free PVR ultrasound     He states he has Medicare secondary to Disability--cannot use savings cards with Medicare.   Coronary Artery Disease He underwent cardiac catheterization 03/17/2013. Was not favorable for PCI. He has had medical treatment of CAD. 10/14/14 he was admitted to the hospital with angina. He subsequently underwent PCI. This is managed by cardiology.   Chronic anticoagulation---PT/INR today 3.9. He is to change his dose to taking a whole pill only on Mondays only (5mg ). Take a half a pill (1/2 of  5mg ---2.5mg  dose) all other days. Return to recheck INR in 1 week.  Atrial fibrillation           This is managed by Cardiology and also EP-- [name]  Atrial flutter            This is managed by Cardiology and also EP-- [name] Pacemaker             This is managed by Cardiology and also EP-- [name]  Depression This is stable and controlled. Continue Wellbutrin.  Insomnia This is well controlled with Ambien 10 mg.  Diabetes mellitus, type 2  03/22/2015 OV: I reviewed my lab result note attached to labs 10/01/14. That result note stated to stop the metformin. Also stated to decrease Januvia from 100 mg to 50 mg. However, today patient states that he is taking metformin just one in the morning only. Says that he is now off of Januvia. Says that the metformin is the only medicine he is currently on for diabetes.. Also, current med list shows Metformin as the only Diabetes Medication at present time.   - Microalbumin, urine---done 06/26/2013, 10/01/2014 Check A1C today  He is on ACE inhibitor. He is on statin therapy. Since he recent PCI, Per Cardiology,  he is on ASA 81mg ,  Plavix and Coumadin per Cardiology.   Foot exam is stable. Patient states that he had an eye exam about one year ago. Is due to schedule followup and patient plans to schedule this. He was told that exam showed "no diabetic damage " At [place] 10/01/2014 he states that his eye exam is due "right now ". I wrote on his AVS to remind him to call to schedule follow-up eye exam. At [place] 03/22/15 he states that he still has not seen ophthalmologist. He is aware that he needs to do so.  He received Pneumovax 23 here 01/05/2011 No further pneumonia vaccine indicated until age 27  Hypertension At goal. On ACE inhibitor.  Check labs to monitor.  Hyperlipidemia At last lab -- LFTs normal. Lipid Panel is excellent. Continued Crestor 20 mg. He is not fasting today so will wait to recheck FLP. Will check LFTs.  Chronic  obstructive pulmonary disease Quit smoking 2013. He is on Advair. His breathing is stable on current medications.  GASTROESOPHAGEAL REFLUX DISEASE Protonix can be used with Plavix to continue this one.  Prostate cancer screening -- Elevated PSA -PSA was elevated 02/05/2014.--4.91.  I ordered referral to urology. Patient reported that he could not afford the co-pay and did not follow-up with urology.  PSA repeated 06/22/2014---4.71---I had noted for staff to see if  can find any type of assistance program to help cover cost so that he can follow-up with urology.However, this has not happened.  AT [place] 10/01/2014--I am discussing with staff again to investigate options for financial assistance/other services, options to help him. AT [place] 03/22/2015--- he states that he has his appointment with urology tomorrow and is planning to follow-up and go to that appointment.   Screening for colorectal cancer The following is copied form his OV on 06/26/2013:   "He reports that he has never had a colonoscopy. Says in the past he was going to -but that's when he then lost his insurance. He is agreeable to proceed with this and is agreeable for me to do her referral for followup with GI. - Ambulatory referral to Gastroenterology "  At f/u OV 11/06/2013 he reports: He cannot even afford the co-pay to see GI for the office visit. His co-pay for that visit was going to be $45. He does not even know what his cost would be for the colonoscopy itself.   Immunizations: He received Pneumovax 23 here in the office on 01/05/2011 No further pneumonia vaccine indicated until age 17. He received tetanus here at our office 01/05/2011 Influenza Vaccine----Given here 04/13/2014, 01/04/2015 Zostavax: So far, have not even discussed, as cost will likely be prohibitive    Follow-up office visit in one week to recheck INR. Routine visit follow-up diabetes etc. 3 months.      Signed, [address] [place], [place],  [facility] 03/22/2015 11:07 AM

## 2015-03-23 DIAGNOSIS — N401 Enlarged prostate with lower urinary tract symptoms: Secondary | ICD-10-CM | POA: Diagnosis not present

## 2015-03-23 DIAGNOSIS — R3913 Splitting of urinary stream: Secondary | ICD-10-CM | POA: Diagnosis not present

## 2015-03-23 DIAGNOSIS — R351 Nocturia: Secondary | ICD-10-CM | POA: Diagnosis not present

## 2015-03-23 DIAGNOSIS — R3912 Poor urinary stream: Secondary | ICD-10-CM | POA: Diagnosis not present

## 2015-03-25 ENCOUNTER — Telehealth: Payer: Self-pay | Admitting: *Deleted

## 2015-03-25 NOTE — Telephone Encounter (Signed)
Submitted [employer] referral thru acuity connect for authorization on 03/23/15 to Dr. Ivar Drape with authorization [id number]  Requesting provider: Flonnie Hailstone  Treating provider: Ivar Drape  Number of visits:6  Start Date: 03/23/15  End Date:09/19/15  Dx: R97.20-elevated prostate specific antigen (PSA)

## 2015-04-01 ENCOUNTER — Encounter: Payer: Self-pay | Admitting: Family Medicine

## 2015-04-05 ENCOUNTER — Encounter: Payer: Self-pay | Admitting: Physician Assistant

## 2015-04-05 ENCOUNTER — Ambulatory Visit (INDEPENDENT_AMBULATORY_CARE_PROVIDER_SITE_OTHER): Payer: Commercial Managed Care - HMO | Admitting: Physician Assistant

## 2015-04-05 VITALS — BP 122/80 | HR 84 | Temp 97.4°F | Resp 18 | Wt 234.0 lb

## 2015-04-05 DIAGNOSIS — Z7901 Long term (current) use of anticoagulants: Secondary | ICD-10-CM | POA: Diagnosis not present

## 2015-04-05 LAB — PT WITH INR/FINGERSTICK
INR, fingerstick: 3.2 — ABNORMAL HIGH (ref 0.80–1.20)
PT, fingerstick: 38.1 seconds — ABNORMAL HIGH (ref 10.4–12.5)

## 2015-04-05 MED ORDER — METFORMIN HCL 500 MG PO TABS: ORAL_TABLET | ORAL | Status: DC

## 2015-04-05 NOTE — Progress Notes (Signed)
Patient ID: [name] Cook MRN: [id number], DOB: 1950-08-08, 64 y.o. Date of Encounter: 04/05/2015, 11:56 AM    Chief Complaint:  Chief Complaint  Patient presents with  . PT/INR    discuss labs from last week     HPI: 64 y.o. year old male here to check INR.   Verified with him--dose he is currently taking: 5mg  (whole tablet) on Maondays. 1/2 tablet (2.5mg ) all other days.  Using pill box---no skipped doses. No bleeding.     Home Meds:   Outpatient Prescriptions Prior to Visit  Medication Sig Dispense Refill  . ADVAIR DISKUS 250-50 MCG/DOSE AEPB TAKE 1 INHALATION BY MOUTH TWICE DAILY INTO LUNGS 60 each 4  . amLODipine (NORVASC) 5 MG tablet Take 1 tablet (5 mg total) by mouth daily. 30 tablet 5  . aspirin EC 81 MG EC tablet Take 1 tablet (81 mg total) by mouth daily. For 1 month    . buPROPion (WELLBUTRIN SR) 150 MG 12 hr tablet TAKE 1 TABLET BY MOUTH TWICE DAILY. 60 tablet 2  . celecoxib (CELEBREX) 200 MG capsule TAKE ONE CAPSULE BY MOUTH DAILY. 30 capsule 3  . clopidogrel (PLAVIX) 75 MG tablet Take 1 tablet (75 mg total) by mouth daily. 90 tablet 3  . fish oil-omega-3 fatty acids 1000 MG capsule Take 1 g by mouth daily.      . furosemide (LASIX) 20 MG tablet TAKE ONE TABLET BY MOUTH DAILY. 30 tablet 0  . glucose blood test strip 1 each by Other route daily. Check Blood Sugar fasting each morning 100 each 3  . isosorbide mononitrate (IMDUR) 30 MG 24 hr tablet TAKE ONE TABLET BY MOUTH ONCE DAILY. 30 tablet 3  . lisinopril (PRINIVIL,ZESTRIL) 20 MG tablet TAKE ONE TABLET BY MOUTH ONCE DAILY. 30 tablet 0  . metoprolol succinate (TOPROL-XL) 50 MG 24 hr tablet TAKE 3 TABLETS (150MG ) BY MOUTH TWICE DAILY 180 tablet 5  . nitroGLYCERIN (NITROSTAT) 0.4 MG SL tablet Place 1 tablet (0.4 mg total) under the tongue every 5 (five) minutes as needed for chest pain. 25 tablet 1  . pantoprazole (PROTONIX) 40 MG tablet TAKE ONE TABLET BY MOUTH DAILY. 90 tablet 1  . promethazine (PHENERGAN) 25  MG tablet Take 1 tablet (25 mg total) by mouth every 8 (eight) hours as needed for nausea or vomiting. 20 tablet 0  . rosuvastatin (CRESTOR) 20 MG tablet TAKE ONE TABLET BY MOUTH BEFORE BEDTIME. 30 tablet 0  . warfarin (COUMADIN) 5 MG tablet Take 1 whole pill on Mondays only. All other days: take 1/2 pill daily. 30 tablet 3  . metFORMIN (GLUCOPHAGE) 500 MG tablet TAKE 1 TABLET BY MOUTH TWICE DAILY WITH A MEAL. (Patient taking differently: TAKE 1 TABLET BY MOUTH ONCE DAILY WITH A MEAL.) 60 tablet 1   No facility-administered medications prior to visit.    Allergies: No Known Allergies    Review of Systems: See HPI for pertinent ROS. All other ROS negative.    Physical Exam: Blood pressure 122/80, pulse 84, temperature 97.4 F (36.3 C), temperature source Oral, resp. rate 18, weight 234 lb (106.142 kg)., Body mass index is 31.73 kg/(m^2). General:  Appears in no acute distress. Neck: Supple. No thyromegaly. No lymphadenopathy. Lungs: Clear bilaterally to auscultation without wheezes, rales, or rhonchi. Breathing is unlabored. Heart: Irregular Rhythm. Msk:  Strength and tone normal for age. Extremities/Skin: Warm and dry. No clubbing or cyanosis. No edema. No rashes or suspicious lesions. Neuro: Alert and oriented X 3. Moves  all extremities spontaneously. Gait is normal. CNII-XII grossly in tact. Psych:  Responds to questions appropriately with a normal affect.   Results for orders placed or performed in visit on 04/05/15  PT with INR/Fingerstick  Result Value Ref Range   PT, fingerstick 38.1 (H) 10.4 - 12.5 seconds   INR, fingerstick 3.2 (H) 0.80 - 1.20     ASSESSMENT AND PLAN:  64 y.o. year old male with  1. Long term current use of anticoagulant therapy - PT with INR/Fingerstick  Cont current dose of whole pill (5mg  ) Mondays only. 1/2 pill (2.5mg ) all other days.   Can wait 4 weeks to recheck.   Signed, [address] [place], [place], [facility] 04/05/2015 11:56 AM

## 2015-04-06 ENCOUNTER — Other Ambulatory Visit: Payer: Self-pay | Admitting: Family Medicine

## 2015-04-06 MED ORDER — METFORMIN HCL 500 MG PO TABS: 500.0000 mg | ORAL_TABLET | Freq: Two times a day (BID) | ORAL | Status: DC

## 2015-04-06 NOTE — Telephone Encounter (Signed)
Metformin refilled

## 2015-04-07 ENCOUNTER — Telehealth: Payer: Self-pay | Admitting: Internal Medicine

## 2015-04-07 MED ORDER — DIGOXIN 125 MCG PO TABS: 0.1250 mg | ORAL_TABLET | Freq: Every day | ORAL | Status: DC

## 2015-04-07 NOTE — Telephone Encounter (Signed)
Spoke w/ pt and requested that he send a remote transmission. Pt verbalized understanding.

## 2015-04-07 NOTE — Telephone Encounter (Signed)
Remote transmission received and reviewed with Device [facility]. Patient's pacemaker is working correctly. Patient is currently in Afib and has been 27% of the time. Per [name], patient is to START DIGOXIN 0.125 mg daily and come in for OV.  Encouraged patient to take appointment this Friday. Patient declined. Informed patient Dr. Tanna Furry scheduler will call to make appointment in January.  Patient agrees with treatment plan.

## 2015-04-07 NOTE — Telephone Encounter (Signed)
New message   Patient calling    Pt c/o BP issue: STAT if pt c/o blurred vision, one-sided weakness or slurred speech  1. What are your last 5 BP readings? Just now  141/105 pulse 110   2. Are you having any other symptoms (ex. Dizziness, headache, blurred vision, passed out)?  Pressure on head  3. What is your BP issue? Call to discuss with nurse.

## 2015-04-07 NOTE — Telephone Encounter (Signed)
Patient called concerned because he is getting short-winded on exertion. His BP is 141/105 and HR 110. He does not feel like his heart is racing and cannot tell if he is in afib.  He denies any other symptoms. No CP, no swelling.  He has taken his medications as directed.  Patient is not in distress on the phone, he is just concerned about HR.  Offered patient overdue OV with [name] 12/23 but patient declined.   To [name] and device clinic for recommendations.

## 2015-04-08 ENCOUNTER — Encounter (HOSPITAL_COMMUNITY): Payer: Self-pay | Admitting: Vascular Surgery

## 2015-04-08 ENCOUNTER — Emergency Department (HOSPITAL_COMMUNITY)
Admission: EM | Admit: 2015-04-08 | Discharge: 2015-04-08 | Disposition: A | Discharge status: Home / Self Care | Payer: Commercial Managed Care - HMO | Attending: Emergency Medicine | Admitting: Emergency Medicine

## 2015-04-08 ENCOUNTER — Emergency Department (HOSPITAL_COMMUNITY): Payer: Commercial Managed Care - HMO

## 2015-04-08 DIAGNOSIS — M1990 Unspecified osteoarthritis, unspecified site: Secondary | ICD-10-CM | POA: Insufficient documentation

## 2015-04-08 DIAGNOSIS — K219 Gastro-esophageal reflux disease without esophagitis: Secondary | ICD-10-CM | POA: Diagnosis not present

## 2015-04-08 DIAGNOSIS — Z791 Long term (current) use of non-steroidal anti-inflammatories (NSAID): Secondary | ICD-10-CM | POA: Diagnosis not present

## 2015-04-08 DIAGNOSIS — Z7902 Long term (current) use of antithrombotics/antiplatelets: Secondary | ICD-10-CM | POA: Diagnosis not present

## 2015-04-08 DIAGNOSIS — I2510 Atherosclerotic heart disease of native coronary artery without angina pectoris: Secondary | ICD-10-CM | POA: Insufficient documentation

## 2015-04-08 DIAGNOSIS — J449 Chronic obstructive pulmonary disease, unspecified: Secondary | ICD-10-CM | POA: Insufficient documentation

## 2015-04-08 DIAGNOSIS — Z8669 Personal history of other diseases of the nervous system and sense organs: Secondary | ICD-10-CM | POA: Diagnosis not present

## 2015-04-08 DIAGNOSIS — E119 Type 2 diabetes mellitus without complications: Secondary | ICD-10-CM | POA: Insufficient documentation

## 2015-04-08 DIAGNOSIS — E669 Obesity, unspecified: Secondary | ICD-10-CM | POA: Diagnosis not present

## 2015-04-08 DIAGNOSIS — Z79899 Other long term (current) drug therapy: Secondary | ICD-10-CM | POA: Diagnosis not present

## 2015-04-08 DIAGNOSIS — I482 Chronic atrial fibrillation: Secondary | ICD-10-CM | POA: Diagnosis not present

## 2015-04-08 DIAGNOSIS — I4892 Unspecified atrial flutter: Secondary | ICD-10-CM | POA: Diagnosis not present

## 2015-04-08 DIAGNOSIS — E785 Hyperlipidemia, unspecified: Secondary | ICD-10-CM | POA: Diagnosis not present

## 2015-04-08 DIAGNOSIS — Z950 Presence of cardiac pacemaker: Secondary | ICD-10-CM | POA: Diagnosis not present

## 2015-04-08 DIAGNOSIS — R0602 Shortness of breath: Secondary | ICD-10-CM | POA: Diagnosis not present

## 2015-04-08 DIAGNOSIS — Z7984 Long term (current) use of oral hypoglycemic drugs: Secondary | ICD-10-CM | POA: Diagnosis not present

## 2015-04-08 DIAGNOSIS — I10 Essential (primary) hypertension: Secondary | ICD-10-CM | POA: Diagnosis not present

## 2015-04-08 DIAGNOSIS — Z87891 Personal history of nicotine dependence: Secondary | ICD-10-CM | POA: Insufficient documentation

## 2015-04-08 DIAGNOSIS — I4891 Unspecified atrial fibrillation: Secondary | ICD-10-CM

## 2015-04-08 DIAGNOSIS — Z7901 Long term (current) use of anticoagulants: Secondary | ICD-10-CM | POA: Insufficient documentation

## 2015-04-08 DIAGNOSIS — Z9889 Other specified postprocedural states: Secondary | ICD-10-CM | POA: Insufficient documentation

## 2015-04-08 LAB — BASIC METABOLIC PANEL
Anion gap: 11 (ref 5–15)
BUN: 17 mg/dL (ref 6–20)
CO2: 24 mmol/L (ref 22–32)
Calcium: 9.9 mg/dL (ref 8.9–10.3)
Chloride: 104 mmol/L (ref 101–111)
Creatinine, Ser: 1.21 mg/dL (ref 0.61–1.24)
GFR calc Af Amer: >60 mL/min (ref >60)
GFR calc non Af Amer: >60 mL/min (ref >60)
Glucose, Bld: 141 mg/dL — ABNORMAL HIGH (ref 65–99)
Potassium: 4.2 mmol/L (ref 3.5–5.1)
Sodium: 139 mmol/L (ref 135–145)

## 2015-04-08 LAB — CBC
HCT: 46 % (ref 39.0–52.0)
Hemoglobin: 15.8 g/dL (ref 13.0–17.0)
MCH: 32.2 pg (ref 26.0–34.0)
MCHC: 34.3 g/dL (ref 30.0–36.0)
MCV: 93.9 fL (ref 78.0–100.0)
Platelets: 153 10*3/uL (ref 150–400)
RBC: 4.9 MIL/uL (ref 4.22–5.81)
RDW: 14.3 % (ref 11.5–15.5)
WBC: 6.2 10*3/uL (ref 4.0–10.5)

## 2015-04-08 LAB — I-STAT TROPONIN, ED: Troponin i, poc: 0 ng/mL (ref 0.00–0.08)

## 2015-04-08 MED ORDER — DIGOXIN 0.25 MG/ML IJ SOLN: 0.2500 mg | Freq: Once | INTRAMUSCULAR | Status: DC

## 2015-04-08 MED ORDER — DIGOXIN 125 MCG PO TABS
0.2500 mg | ORAL_TABLET | Freq: Once | ORAL | Status: AC
  Administered 2015-04-08: 0.25 mg via ORAL
  Filled 2015-04-08: qty 2

## 2015-04-08 NOTE — ED Notes (Signed)
Pt reports to the ED for eval of increasing blood pressure and he was found to be in atrial fibrillation. Pt has hx of same. He called his PCP and was placed on Digoxin however it is not helping. Pt reports he can always tell when he is in atrial fibrillation and he feels that he is in it right now. 12 lead obtained in triage. Pt denies any CP or HA. Pt reports some SOB with minimal exertion. Pt A&Ox4, resp e/u, and skin warm and dry.

## 2015-04-08 NOTE — Consult Note (Signed)
CARDIOLOGY CONSULT NOTE   Patient ID: [name] Cook MRN: [id number], DOB/AGE: 62-Jun-1952   Admit date: 04/08/2015 Date of Consult: 04/08/2015   Primary Physician: Karis Juba, PA-C Primary Cardiologist: [name]  Pt. Profile  [name] Cook is a 64 yo male with PMH of persistent atrial fibrillation s/p AV nodal ablation and PPM placement, HTN, HLD, DM, CAD, and CKD stage II-III presents with high blood pressure, dizziness, DOE, and tachycardia. He was started on digoxin the day before for the same symptom  Problem List  Past Medical History  Diagnosis Date  . Persistent atrial fibrillation (HCC)     recurrent atrial flutter since 2001 s/p DCCVs, multiple failed AADs, h/o tachy-mediated cardiomyopathy  . Hypertension     with hypertensive heart disease  . Hyperlipidemia   . Atrial flutter ([place])     radiofrequency ablation in 2001  . Obesity   . Chronic anticoagulation     chronic Coumadin anticoagulation  . Tobacco abuse   . Chronic obstructive pulmonary disease ([place]) 04/20/2011  . GERD (gastroesophageal reflux disease)   . CAD (coronary artery disease)     a. Nonobstructive. Cardiac cath in 2001-50% mid RI, normal LM, LAD, RCA b. cath 10/16/2014 95% mid RCA treated with DES, 99% ost D1 medical management due to small aneurysmal segment  . Shortness of breath     "can come on at any time" (03/14/2013)  . Sleep apnea     "dx'd; couldn't wear the mask" (03/14/2013)  . Diabetes mellitus, type 2 ([place])   . Arthritis     "knees and lower back" (03/14/2013)    Past Surgical History  Procedure Laterality Date  . Atrial flutter ablation  2002    atrial flutter; subsequently developed atrial fibrillation  . Loop recorder implant  2002  . Cardioversion  05/31/2011    Procedure: CARDIOVERSION;  Surgeon: Cristopher Estimable. Lattie Haw, MD;  Location: [facility];  Service: Cardiovascular;  Laterality: N/A;  . Av node ablation  01/24/2013  . Insert / replace / remove pacemaker  01/24/2013   Medtronic Adapta L dual-chamber pacemaker, serial number [id number]   . Carpal tunnel release Left 1980's  . Tibial tuberclerplasty  ~ 2003  . Cardiac catheterization  2002  . Permanent pacemaker insertion N/A 01/24/2013    Procedure: PERMANENT PACEMAKER INSERTION;  Surgeon: [name], MD;  Location: [facility] CATH LAB;  Service: Cardiovascular;  Laterality: N/A;  . Left heart catheterization with coronary angiogram N/A 03/17/2013    Procedure: LEFT HEART CATHETERIZATION WITH CORONARY ANGIOGRAM;  Surgeon: Burnell Blanks, MD; LAD mild dz, D1 branch 100%, inferior branch 99%, CFX OK, RCA 50%, EF 65%    . Cardiac catheterization N/A 10/16/2014    Procedure: Left Heart Cath and Coronary Angiography;  Surgeon: [name], MD;  Location: [place] CV LAB;  Service: Cardiovascular;  Laterality: N/A;     Allergies  No Known Allergies  HPI   [name] Cook is a 64 yo male with PMH of persistent atrial fibrillation s/p AV nodal ablation and PPM placement, HTN, HLD, DM, CAD, and CKD stage II-III. Previously followed by [name] and on Coumadin chronically for persistent atrial fibrillation. 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 He had previously underwent exercise treadmill testing that demonstrated blunted chronotropic response, his device was reprogrammed making his rate response more aggressive. He had a previous cardiac catheterization 2014 which revealed total occlusion of small caliber superior branch of bifurcating diagonal, this was not  favorable for PCI given aneurysmal segment. He was admitted to hospital again in June 2016 with chest pain and fatigue. His symptom was concerning for unstable angina. His Coumadin was held and that he underwent cardiac catheterization on 10/16/2014 which showed 95% mid RCA lesion, 99% ostial D1 stenosis, 100% lateral D1 occlusion. Severe RCA stenosis was treated successfully using Synergy DES. Echocardiogram obtained during  that admission showed EF 35-40%. Post cath, he was placed on aspirin, Plavix and the Coumadin triple therapy. His aspirin was stopped after one month and he was continued on Plavix and Coumadin therapy. He was last seen in the clinic on 11/03/2014 at which time he he was doing well. He called Dr. Tanna Furry office on 04/07/2015 due to concern of short-winded on exertion and elevated blood pressure and heart rate. He was started on digoxin 0.125 mg daily. He was advised to come in for office visit, however he will wish to delay until January.  He came in to [facility] ED on 04/08/2015 with the same complaint dyspnea on exertion, tachycardia and hypertension related to tachycardia. He says his heart rate sometimes will get up to 110s at home. Previous transmissions showed patient does goes into atrial fibrillation, however given the previous AV nodal ablation, he is largely pacemaker dependent. So far he has taken only one dose of digoxin before coming to the hospital. He denies any shortness of breath at rest, denies any chest pain that is reminiscent of previous angina. He does complain of occasional dizziness which he correlate with going into atrial fibrillation. He denies any other cardiac awareness. He has been compliant with his medication and his Coumadin level has largely been therapeutic or supratherapeutic. He denies any fever, chill, or cough. He denies any lower extremity edema, orthopnea or paroxysmal nocturnal dyspnea.  Patient is a poor historian, it is unknown what his baseline mental status is.    Inpatient Medications  . digoxin  0.25 mg Intravenous Once    Family History Family History  Problem Relation Age of Onset  . Alzheimer's disease Mother   . Osteoporosis Mother      Social History Social History   Social History  . Marital Status: Legally Separated    Spouse Name: N/A  . Number of Children: 1  . Years of Education: N/A   Occupational History  . Unemployed   .      Social History Main Topics  . Smoking status: Former Smoker -- 1.00 packs/day for 42 years    Types: Cigarettes    Quit date: 12/31/2011  . Smokeless tobacco: Never Used  . Alcohol Use: Yes     Comment: 03/14/2013 "stopped drinking back in 2002; never had problem w/it"  . Drug Use: No  . Sexual Activity: Not Currently   Other Topics Concern  . Not on file   Social History Narrative   Unable to afford medications   Resides with girlfriend      Pt lives in [place]   Disabled (arthritis), previously worked at an [employer] and [employer] treatment center.           Review of Systems  General:  No chills, fever, night sweats or weight changes.  Cardiovascular:  No chest pain, edema, orthopnea, palpitations, paroxysmal nocturnal dyspnea. + dyspnea on exertion Dermatological: No rash, lesions/masses Respiratory: No cough Urologic: No hematuria, dysuria Abdominal:   No nausea, vomiting, diarrhea, bright red blood per rectum, melena, or hematemesis Neurologic:  No visual changes, wkns, changes in mental  status. +occasional dizziness All other systems reviewed and are otherwise negative except as noted above.  Physical Exam  Blood pressure 115/89, pulse 77, temperature 97.5 F (36.4 C), temperature source Oral, resp. rate 15, SpO2 96 %.  General: Pleasant, NAD Psych: Normal affect. Neuro: Alert and oriented X 3. Does take awhile to answer question. Moves all extremities spontaneously. HEENT: Normal  Neck: Supple without bruits or JVD. Lungs:  Resp regular and unlabored, CTA. Heart: RRR no s3, s4, or murmurs. Abdomen: Soft, non-tender, non-distended, BS + x 4.  Extremities: No clubbing, cyanosis or edema. DP/PT/Radials 2+ and equal bilaterally.  Labs  No results for input(s): CKTOTAL, CKMB, TROPONINI in the last 72 hours. Lab Results  Component Value Date   WBC 6.2 04/08/2015   HGB 15.8 04/08/2015   HCT 46.0 04/08/2015   MCV 93.9 04/08/2015   PLT 153 04/08/2015      Recent Labs Lab 04/08/15 1514  NA 139  K 4.2  CL 104  CO2 24  BUN 17  CREATININE 1.21  CALCIUM 9.9  GLUCOSE 141*   Lab Results  Component Value Date   CHOL 116 10/15/2014   HDL 35* 10/15/2014   LDLCALC 59 10/15/2014   TRIG 112 10/15/2014   No results found for: DDIMER  Radiology/Studies  Dg Chest 2 View  04/08/2015  CLINICAL DATA:  Shortness of breath. History of chronic atrial fibrillation. EXAM: CHEST  2 VIEW COMPARISON:  03/14/2013 FINDINGS: Dual lead cardiac pacemaker is stable. Cardiomediastinal silhouette is stably enlarged. Mediastinal contours appear intact. There is no evidence of focal airspace consolidation, pleural effusion or pneumothorax. Osseous structures are without acute abnormality. Soft tissues are grossly normal. IMPRESSION: No active cardiopulmonary disease. Electronically Signed   By: Fidela Salisbury M.D.   On: 04/08/2015 16:16    ECG  Paced rhythm with HR 102  ASSESSMENT AND PLAN  1. Chronic atrial fibrillation s/p AV nodal ablation and Medtronic PPM placement  - complained of fast HR with HR 110s. Discussed with Dr. Mare Ferrari, medtronic interrogation report reviewed, he stays in HR 80s most of the time, occasional fast HR. So far he has only taken 1 dose of 0.125mg  digoxin, he does not have peripheral IV, will give him 0.25mg  digoxin to speed up loading, and have him continue on 0.125mg  digoxin at home.   2. DOE: unclear cause, current euvolemic on exam. Last echo 10/16/2014 EF 35-40%.   3. HTN: SBP 130s majority of the time. 4. HLD 5. DM  6. CAD: s/p cath on 10/16/2014 DES to mid RCA, ASA dropped after a month to avoid triple therapy, currently on plavix and coumadin: no angina  7. CKD stage II-III     Hilbert Corrigan, PA-C 04/08/2015, 6:51 PM

## 2015-04-08 NOTE — Discharge Instructions (Signed)
You have been seen today for atrial fibrillation. Your imaging and lab tests showed no abnormalities. Follow up with PCP as needed. Return to ED should symptoms worsen. Follow-up with your cardiologist as soon as possible for chronic management of this issue. Continue to take the digoxin as prescribed.

## 2015-04-08 NOTE — ED Provider Notes (Signed)
CSN: [id number]     Arrival date & time 04/08/15  1504 History   First MD Initiated Contact with Patient 04/08/15 1625     Chief Complaint  Patient presents with  . Atrial Fibrillation     (Consider location/radiation/quality/duration/timing/severity/associated sxs/prior Treatment) HPI   [name] Cook is a 64 y.o. male, with a history of A. fib, non-demand pacemaker, hypertension, a flutter with RF ablation in 2001, COPD, CAD, and DM, presenting to the ED with complaint of intermittent A-fib over the last month. Pt states that he can feel when he goes into A-fib because he gets short of breath and dizzy. Pt is not currently in A-fib and currently has no complaints. Patient's cardiologist is [name]. Pt called Dr. Tanna Furry office yesterday, sent in a read from his pacemaker that showed that he was in A-fib, and was placed on digoxin. Pt was not on anything specifically for his A-fib prior to this. Pt is concerned because the digoxin isn't working. Pt states the highest rate he felt was 109 this afternoon. Pt denies chest pain, current shortness of breath, N/V, syncope, neuro deficits, or any other complaints. Pt adds that he came here because [name] couldn't get him in until January.     Past Medical History  Diagnosis Date  . Persistent atrial fibrillation (HCC)     recurrent atrial flutter since 2001 s/p DCCVs, multiple failed AADs, h/o tachy-mediated cardiomyopathy  . Hypertension     with hypertensive heart disease  . Hyperlipidemia   . Atrial flutter ([place])     radiofrequency ablation in 2001  . Obesity   . Chronic anticoagulation     chronic Coumadin anticoagulation  . Tobacco abuse   . Chronic obstructive pulmonary disease ([place]) 04/20/2011  . GERD (gastroesophageal reflux disease)   . CAD (coronary artery disease)     a. Nonobstructive. Cardiac cath in 2001-50% mid RI, normal LM, LAD, RCA b. cath 10/16/2014 95% mid RCA treated with DES, 99% ost D1 medical management due to  small aneurysmal segment  . Shortness of breath     "can come on at any time" (03/14/2013)  . Sleep apnea     "dx'd; couldn't wear the mask" (03/14/2013)  . Diabetes mellitus, type 2 ([place])   . Arthritis     "knees and lower back" (03/14/2013)   Past Surgical History  Procedure Laterality Date  . Atrial flutter ablation  2002    atrial flutter; subsequently developed atrial fibrillation  . Loop recorder implant  2002  . Cardioversion  05/31/2011    Procedure: CARDIOVERSION;  Surgeon: Cristopher Estimable. Lattie Haw, MD;  Location: [facility];  Service: Cardiovascular;  Laterality: N/A;  . Av node ablation  01/24/2013  . Insert / replace / remove pacemaker  01/24/2013     Medtronic Adapta L dual-chamber pacemaker, serial number [id number]   . Carpal tunnel release Left 1980's  . Tibial tuberclerplasty  ~ 2003  . Cardiac catheterization  2002  . Permanent pacemaker insertion N/A 01/24/2013    Procedure: PERMANENT PACEMAKER INSERTION;  Surgeon: [name], MD;  Location: [facility] CATH LAB;  Service: Cardiovascular;  Laterality: N/A;  . Left heart catheterization with coronary angiogram N/A 03/17/2013    Procedure: LEFT HEART CATHETERIZATION WITH CORONARY ANGIOGRAM;  Surgeon: Burnell Blanks, MD; LAD mild dz, D1 branch 100%, inferior branch 99%, CFX OK, RCA 50%, EF 65%    . Cardiac catheterization N/A 10/16/2014    Procedure: Left Heart Cath and Coronary Angiography;  Surgeon: [name], MD;  Location: [place] CV LAB;  Service: Cardiovascular;  Laterality: N/A;   Family History  Problem Relation Age of Onset  . Alzheimer's disease Mother   . Osteoporosis Mother    Social History  Substance Use Topics  . Smoking status: Former Smoker -- 1.00 packs/day for 42 years    Types: Cigarettes    Quit date: 12/31/2011  . Smokeless tobacco: Never Used  . Alcohol Use: Yes     Comment: 03/14/2013 "stopped drinking back in 2002; never had problem w/it"    Review of Systems  Cardiovascular:        A-fib  Neurological: Negative for syncope, weakness and headaches.  All other systems reviewed and are negative.     Allergies  Review of patient's allergies indicates no known allergies.  Home Medications   Prior to Admission medications   Medication Sig Start Date End Date Taking? Authorizing Provider  amLODipine (NORVASC) 5 MG tablet Take 1 tablet (5 mg total) by mouth daily. 10/17/14  Yes Almyra Deforest, PA  buPROPion (WELLBUTRIN SR) 150 MG 12 hr tablet TAKE 1 TABLET BY MOUTH TWICE DAILY. 12/17/14  Yes Mary B Dixon, PA-C  celecoxib (CELEBREX) 200 MG capsule TAKE ONE CAPSULE BY MOUTH DAILY. 02/11/15  Yes Orlena Sheldon, PA-C  clopidogrel (PLAVIX) 75 MG tablet Take 1 tablet (75 mg total) by mouth daily. 10/17/14  Yes Almyra Deforest, PA  digoxin (LANOXIN) 0.125 MG tablet Take 1 tablet (0.125 mg total) by mouth daily. 04/07/15  Yes [name], MD  fish oil-omega-3 fatty acids 1000 MG capsule Take 1 g by mouth daily.     Yes Historical Provider, MD  furosemide (LASIX) 20 MG tablet TAKE ONE TABLET BY MOUTH DAILY. 03/15/15  Yes Mary B Dixon, PA-C  isosorbide mononitrate (IMDUR) 30 MG 24 hr tablet TAKE ONE TABLET BY MOUTH ONCE DAILY. 02/11/15  Yes Mary B Dixon, PA-C  lisinopril (PRINIVIL,ZESTRIL) 20 MG tablet TAKE ONE TABLET BY MOUTH ONCE DAILY. 03/15/15  Yes Orlena Sheldon, PA-C  metFORMIN (GLUCOPHAGE) 500 MG tablet Take 1 tablet (500 mg total) by mouth 2 (two) times daily with a meal. 04/06/15  Yes Orlena Sheldon, PA-C  metoprolol succinate (TOPROL-XL) 50 MG 24 hr tablet TAKE 3 TABLETS (150MG ) BY MOUTH TWICE DAILY 11/19/14  Yes Lonie Peak Dixon, PA-C  pantoprazole (PROTONIX) 40 MG tablet TAKE ONE TABLET BY MOUTH DAILY. 03/16/15  Yes Mary B Dixon, PA-C  rosuvastatin (CRESTOR) 20 MG tablet TAKE ONE TABLET BY MOUTH BEFORE BEDTIME. 03/15/15  Yes Orlena Sheldon, PA-C  warfarin (COUMADIN) 5 MG tablet Take 1 whole pill on Mondays only. All other days: take 1/2 pill daily. 03/22/15  Yes Mary B Dixon, PA-C  ADVAIR DISKUS 250-50  MCG/DOSE AEPB TAKE 1 INHALATION BY MOUTH TWICE DAILY INTO LUNGS Patient not taking: Reported on 04/08/2015 12/10/13   Orlena Sheldon, PA-C  glucose blood test strip 1 each by Other route daily. Check Blood Sugar fasting each morning 03/22/15   Orlena Sheldon, PA-C  nitroGLYCERIN (NITROSTAT) 0.4 MG SL tablet Place 1 tablet (0.4 mg total) under the tongue every 5 (five) minutes as needed for chest pain. 06/17/14   Lonie Peak Dixon, PA-C  promethazine (PHENERGAN) 25 MG tablet Take 1 tablet (25 mg total) by mouth every 8 (eight) hours as needed for nausea or vomiting. 03/22/15   Lonie Peak Dixon, PA-C   BP 126/84 mmHg  Pulse 70  Temp(Src) 97.5 F (36.4 C) (Oral)  Resp 11  SpO2 97% Physical Exam  Constitutional: He appears well-developed and well-nourished. No distress.  HENT:  Head: Normocephalic and atraumatic.  Eyes: Conjunctivae are normal. Pupils are equal, round, and reactive to light.  Cardiovascular: Normal rate, regular rhythm, normal heart sounds and intact distal pulses.   Pulmonary/Chest: Effort normal and breath sounds normal. No respiratory distress.  Abdominal: Soft. Bowel sounds are normal.  Musculoskeletal: He exhibits no edema or tenderness.  Neurological: He is alert.  Skin: Skin is warm and dry. He is not diaphoretic. No pallor.  Nursing note and vitals reviewed.   ED Course  Procedures (including critical care time) Labs Review Labs Reviewed  BASIC METABOLIC PANEL - Abnormal; Notable for the following:    Glucose, Bld 141 (*)    All other components within normal limits  CBC  I-STAT TROPOININ, ED    Imaging Review Dg Chest 2 View  04/08/2015  CLINICAL DATA:  Shortness of breath. History of chronic atrial fibrillation. EXAM: CHEST  2 VIEW COMPARISON:  03/14/2013 FINDINGS: Dual lead cardiac pacemaker is stable. Cardiomediastinal silhouette is stably enlarged. Mediastinal contours appear intact. There is no evidence of focal airspace consolidation, pleural effusion or  pneumothorax. Osseous structures are without acute abnormality. Soft tissues are grossly normal. IMPRESSION: No active cardiopulmonary disease. Electronically Signed   By: Fidela Salisbury M.D.   On: 04/08/2015 16:16   I have personally reviewed and evaluated these images and lab results as part of my medical decision-making.   EKG Interpretation   Date/Time:  Thursday April 08 2015 15:05:52 EST Ventricular Rate:  102 PR Interval:    QRS Duration: 184 QT Interval:  426 QTC Calculation: 555 R Axis:   -77 Text Interpretation:  Ventricular-paced rhythm Abnormal ECG rate is faster  compared to July 2016 Confirmed by Regenia Skeeter  MD, [place] [phone]) on  04/08/2015 4:18:00 PM      MDM   Final diagnoses:  Atrial fibrillation, unspecified type ([place])    [name] presents with complaints of multiple incidences of A. fib with RVR accompanied by dizziness and shortness of breath.  Findings and plan of care discussed with Sherwood Gambler, MD.  Interrogation of pacemaker shows at least 5 episodes of A. fib with RVR today with ventricular rate of 102-128 25% of the time. Patient is nontoxic appearing, not currently in A. fib, is in no apparent distress, and not tachycardic. 5:45 PM Spoke with Dr. Mare Ferrari, cardiology, who agreed to come down and see the patient. 7:27 PM Cardiology evaluated the patient and recommend an increase in digoxin dose and discharge with close cardiology follow up. Pt is still asymptomatic and has not gone into Afib since he's been here in the ED.   Filed Vitals:   04/08/15 1830 04/08/15 1845 04/08/15 1915 04/08/15 1923  BP: 130/90 115/87 126/84   Pulse: 71 73 71 70  Temp:      TempSrc:      Resp: 11 11    SpO2: 99% 99% 97%      Lorayne Bender, PA-C 04/08/15 [place], MD 04/09/15 2318

## 2015-04-14 ENCOUNTER — Other Ambulatory Visit: Payer: Self-pay | Admitting: Physician Assistant

## 2015-04-14 NOTE — Telephone Encounter (Signed)
Medication refilled per protocol. 

## 2015-04-15 ENCOUNTER — Other Ambulatory Visit: Payer: Self-pay | Admitting: Physician Assistant

## 2015-04-15 NOTE — Telephone Encounter (Signed)
Please review for refill, Thank you. 

## 2015-04-15 NOTE — Telephone Encounter (Signed)
Medication refilled per protocol. 

## 2015-04-28 ENCOUNTER — Telehealth: Payer: Self-pay | Admitting: *Deleted

## 2015-04-28 NOTE — Telephone Encounter (Signed)
Received [employer] with authorization number [id number] to Dr Salome Spotted   Requesting provider: Salome Spotted  Treating provider: Salome Spotted  Number of visits:6  Start Date: 05/03/15  End date: 10/30/15  Dx:I20.0-unstable angina      I25.110-athscl heart disease fo native cor art unstable ang pctrs      Z95.0- Presence of cardiac pacemaker      I48.1- persistant atrial fibrillation

## 2015-05-04 ENCOUNTER — Ambulatory Visit: Payer: Commercial Managed Care - HMO | Admitting: Internal Medicine

## 2015-05-05 ENCOUNTER — Ambulatory Visit: Payer: Commercial Managed Care - HMO | Admitting: Physician Assistant

## 2015-05-24 ENCOUNTER — Encounter: Payer: Self-pay | Admitting: Physician Assistant

## 2015-05-24 ENCOUNTER — Ambulatory Visit (INDEPENDENT_AMBULATORY_CARE_PROVIDER_SITE_OTHER): Payer: Commercial Managed Care - HMO | Admitting: Physician Assistant

## 2015-05-24 VITALS — BP 128/70 | HR 88 | Temp 97.5°F | Resp 18 | Wt 230.0 lb

## 2015-05-24 DIAGNOSIS — Z7901 Long term (current) use of anticoagulants: Secondary | ICD-10-CM

## 2015-05-24 LAB — PT WITH INR/FINGERSTICK
INR, fingerstick: 2.8 — ABNORMAL HIGH (ref 0.80–1.20)
PT, fingerstick: 33.4 seconds — ABNORMAL HIGH (ref 10.4–12.5)

## 2015-05-24 NOTE — Progress Notes (Signed)
Patient ID: [name] Cook MRN: [id number], DOB: 1950-10-03, 65 y.o. Date of Encounter: 05/24/2015, 11:00 AM    Chief Complaint:  Chief Complaint  Patient presents with  . PT/INR     HPI: 65 y.o. year old male here for PT/INR check.  He is taking a whole Coumadin on Mondays only. He is taking a half of a Coumadin all other days of the week.  He is having no bleeding. He is on no new medications. No complaints or concerns today.     Home Meds:   Outpatient Prescriptions Prior to Visit  Medication Sig Dispense Refill  . amLODipine (NORVASC) 5 MG tablet TAKE ONE TABLET BY MOUTH ONCE DAILY. 30 tablet 7  . buPROPion (WELLBUTRIN SR) 150 MG 12 hr tablet TAKE 1 TABLET BY MOUTH TWICE DAILY. 60 tablet 5  . celecoxib (CELEBREX) 200 MG capsule TAKE ONE CAPSULE BY MOUTH DAILY. 30 capsule 3  . clopidogrel (PLAVIX) 75 MG tablet Take 1 tablet (75 mg total) by mouth daily. 90 tablet 3  . digoxin (LANOXIN) 0.125 MG tablet Take 1 tablet (0.125 mg total) by mouth daily. 30 tablet 11  . fish oil-omega-3 fatty acids 1000 MG capsule Take 1 g by mouth daily.      . furosemide (LASIX) 20 MG tablet TAKE ONE TABLET BY MOUTH DAILY. 30 tablet 5  . glucose blood test strip 1 each by Other route daily. Check Blood Sugar fasting each morning 100 each 3  . isosorbide mononitrate (IMDUR) 30 MG 24 hr tablet TAKE ONE TABLET BY MOUTH ONCE DAILY. 30 tablet 3  . lisinopril (PRINIVIL,ZESTRIL) 20 MG tablet TAKE ONE TABLET BY MOUTH ONCE DAILY. 30 tablet 5  . metoprolol succinate (TOPROL-XL) 50 MG 24 hr tablet TAKE 3 TABLETS (150MG ) BY MOUTH TWICE DAILY 180 tablet 5  . nitroGLYCERIN (NITROSTAT) 0.4 MG SL tablet Place 1 tablet (0.4 mg total) under the tongue every 5 (five) minutes as needed for chest pain. 25 tablet 1  . pantoprazole (PROTONIX) 40 MG tablet TAKE ONE TABLET BY MOUTH DAILY. 90 tablet 1  . promethazine (PHENERGAN) 25 MG tablet Take 1 tablet (25 mg total) by mouth every 8 (eight) hours as needed for nausea or  vomiting. 20 tablet 0  . rosuvastatin (CRESTOR) 20 MG tablet TAKE ONE TABLET BY MOUTH BEFORE BEDTIME. 30 tablet 5  . warfarin (COUMADIN) 5 MG tablet Take 1 whole pill on Mondays only. All other days: take 1/2 pill daily. 30 tablet 3  . metFORMIN (GLUCOPHAGE) 500 MG tablet Take 1 tablet (500 mg total) by mouth 2 (two) times daily with a meal. (Patient taking differently: Take 500 mg by mouth daily with breakfast. ) 60 tablet 5  . ADVAIR DISKUS 250-50 MCG/DOSE AEPB TAKE 1 INHALATION BY MOUTH TWICE DAILY INTO LUNGS (Patient not taking: Reported on 04/08/2015) 60 each 4   No facility-administered medications prior to visit.    Allergies: No Known Allergies    Review of Systems: See HPI for pertinent ROS. All other ROS negative.    Physical Exam: Blood pressure 128/70, pulse 88, temperature 97.5 F (36.4 C), temperature source Oral, resp. rate 18, weight 230 lb (104.327 kg)., Body mass index is 31.19 kg/(m^2). General:  WNWD WM. Appears in no acute distress. Neck: Supple. No thyromegaly. No lymphadenopathy. Lungs: Clear bilaterally to auscultation without wheezes, rales, or rhonchi. Breathing is unlabored. Heart: Irregular rhythm.  Msk:  Strength and tone normal for age. Extremities/Skin: Warm and dry.  Neuro: Alert and oriented  X 3. Moves all extremities spontaneously. Gait is normal. CNII-XII grossly in tact. Psych:  Responds to questions appropriately with a normal affect.   Results for orders placed or performed in visit on 05/24/15  PT with INR/Fingerstick  Result Value Ref Range   PT, fingerstick 33.4 (H) 10.4 - 12.5 seconds   INR, fingerstick 2.8 (H) 0.80 - 1.20     ASSESSMENT AND PLAN:  65 y.o. year old male with  1. Long term current use of anticoagulant therapy  - PT with INR/Fingerstick  INR is therapeutic. Continue current dosing--with a whole tablet on Mondays and a half a tablet all other days of the week. Recheck PT/INR 4 weeks.  At that time he will be due for  routine visit to follow-up diabetes etc. Last A1c 03/22/15. Told him to make sure his appointment is March 6 are after for insurance purposes. We'll do full routine visit at that next appointment. Follow-up sooner if needed.  Signed, [address] [place], [place], [facility] 05/24/2015 11:00 AM

## 2015-05-27 ENCOUNTER — Encounter: Payer: Self-pay | Admitting: Physician Assistant

## 2015-05-27 ENCOUNTER — Ambulatory Visit (INDEPENDENT_AMBULATORY_CARE_PROVIDER_SITE_OTHER): Payer: Commercial Managed Care - HMO | Admitting: Physician Assistant

## 2015-05-27 VITALS — BP 132/86 | HR 88 | Temp 97.4°F | Resp 18 | Wt 228.0 lb

## 2015-05-27 DIAGNOSIS — B349 Viral infection, unspecified: Secondary | ICD-10-CM

## 2015-05-27 DIAGNOSIS — J029 Acute pharyngitis, unspecified: Secondary | ICD-10-CM | POA: Diagnosis not present

## 2015-05-27 DIAGNOSIS — B9789 Other viral agents as the cause of diseases classified elsewhere: Secondary | ICD-10-CM

## 2015-05-27 DIAGNOSIS — J988 Other specified respiratory disorders: Secondary | ICD-10-CM

## 2015-05-27 LAB — STREP GROUP A AG, W/REFLEX TO CULT: STREGTOCOCCUS GROUP A AG SCREEN: NOT DETECTED

## 2015-05-27 MED ORDER — FLUTICASONE PROPIONATE 50 MCG/ACT NA SUSP: 2.0000 | Freq: Every day | NASAL | Status: DC

## 2015-05-27 NOTE — Progress Notes (Signed)
Patient ID: [name] Cook MRN: [id number], DOB: [birth year], 65 y.o. Date of Encounter: 05/27/2015, 1:39 PM    Chief Complaint:  Chief Complaint  Patient presents with  . sick x 2 days    congestion, cough, sore throat     HPI: 65 y.o. year old male presents with above. Says that his head feels stopped up. Says at times can get some drainage out but it is clear. Says that he has a cough but cannot get out any phlegm and this is nonproductive. His throat feels just a little irritated. Says that his throat is not really sore. Has had no known fever. Temperature here 97.4.     Home Meds:   Outpatient Prescriptions Prior to Visit  Medication Sig Dispense Refill  . ADVAIR DISKUS 250-50 MCG/DOSE AEPB TAKE 1 INHALATION BY MOUTH TWICE DAILY INTO LUNGS 60 each 4  . amLODipine (NORVASC) 5 MG tablet TAKE ONE TABLET BY MOUTH ONCE DAILY. 30 tablet 7  . buPROPion (WELLBUTRIN SR) 150 MG 12 hr tablet TAKE 1 TABLET BY MOUTH TWICE DAILY. 60 tablet 5  . celecoxib (CELEBREX) 200 MG capsule TAKE ONE CAPSULE BY MOUTH DAILY. 30 capsule 3  . clopidogrel (PLAVIX) 75 MG tablet Take 1 tablet (75 mg total) by mouth daily. 90 tablet 3  . digoxin (LANOXIN) 0.125 MG tablet Take 1 tablet (0.125 mg total) by mouth daily. 30 tablet 11  . fish oil-omega-3 fatty acids 1000 MG capsule Take 1 g by mouth daily.      . furosemide (LASIX) 20 MG tablet TAKE ONE TABLET BY MOUTH DAILY. 30 tablet 5  . glucose blood test strip 1 each by Other route daily. Check Blood Sugar fasting each morning 100 each 3  . isosorbide mononitrate (IMDUR) 30 MG 24 hr tablet TAKE ONE TABLET BY MOUTH ONCE DAILY. 30 tablet 3  . lisinopril (PRINIVIL,ZESTRIL) 20 MG tablet TAKE ONE TABLET BY MOUTH ONCE DAILY. 30 tablet 5  . metFORMIN (GLUCOPHAGE) 500 MG tablet Take 500 mg by mouth daily with breakfast.    . metoprolol succinate (TOPROL-XL) 50 MG 24 hr tablet TAKE 3 TABLETS (150MG ) BY MOUTH TWICE DAILY 180 tablet 5  . nitroGLYCERIN (NITROSTAT) 0.4 MG SL  tablet Place 1 tablet (0.4 mg total) under the tongue every 5 (five) minutes as needed for chest pain. 25 tablet 1  . pantoprazole (PROTONIX) 40 MG tablet TAKE ONE TABLET BY MOUTH DAILY. 90 tablet 1  . promethazine (PHENERGAN) 25 MG tablet Take 1 tablet (25 mg total) by mouth every 8 (eight) hours as needed for nausea or vomiting. 20 tablet 0  . rosuvastatin (CRESTOR) 20 MG tablet TAKE ONE TABLET BY MOUTH BEFORE BEDTIME. 30 tablet 5  . warfarin (COUMADIN) 5 MG tablet Take 1 whole pill on Mondays only. All other days: take 1/2 pill daily. 30 tablet 3   No facility-administered medications prior to visit.    Allergies: No Known Allergies    Review of Systems: See HPI for pertinent ROS. All other ROS negative.    Physical Exam: Blood pressure 132/86, pulse 88, temperature 97.4 F (36.3 C), temperature source Oral, resp. rate 18, weight 228 lb (103.42 kg)., Body mass index is 30.92 kg/(m^2). General:  WNWD WM. Appears in no acute distress. HEENT: Normocephalic, atraumatic, eyes without discharge, sclera non-icteric, nares are without discharge. Bilateral auditory canals clear, TM's are without perforation, pearly grey and translucent with reflective cone of light bilaterally. Oral cavity moist, posterior pharynx without exudate, erythema, peritonsillar abscess.  No tenderness with percussion to frontal and maxillary sinuses bilaterally.  Neck: Supple. No thyromegaly. No lymphadenopathy. Lungs: Clear bilaterally to auscultation without wheezes, rales, or rhonchi. Breathing is unlabored. Heart: Irregular  Msk:  Strength and tone normal for age. Extremities/Skin: Warm and dry. Neuro: Alert and oriented X 3. Moves all extremities spontaneously. Gait is normal. CNII-XII grossly in tact. Psych:  Responds to questions appropriately with a normal affect.   Results for orders placed or performed in visit on 05/27/15  STREP GROUP A AG, W/REFLEX TO CULT  Result Value Ref Range   SOURCE THROAT     STREGTOCOCCUS GROUP A AG SCREEN Not Detected      ASSESSMENT AND PLAN:  65 y.o. year old male with  1. Viral respiratory infection Discussed with him that this is likely viral and should run its course and resolve on its own. In use over-the-counter medications for symptom relief such as lozenges or spray for throat and cough. Given his cardiac history cannot use decongestants. Use Flonase to help with nasal congestion. Follow-up if symptoms persist greater than 7-10 days without resolution. - fluticasone (FLONASE) 50 MCG/ACT nasal spray; Place 2 sprays into both nostrils daily.  Dispense: 16 g; Refill: 6  2. Sorethroat - STREP GROUP A AG, W/REFLEX TO CULT   Signed, [address] [place], [place], [facility] 05/27/2015 1:39 PM

## 2015-06-03 ENCOUNTER — Encounter: Payer: Self-pay | Admitting: Internal Medicine

## 2015-06-03 ENCOUNTER — Ambulatory Visit (INDEPENDENT_AMBULATORY_CARE_PROVIDER_SITE_OTHER): Payer: Commercial Managed Care - HMO | Admitting: Internal Medicine

## 2015-06-03 VITALS — BP 146/94 | HR 102 | Ht 72.0 in | Wt 226.0 lb

## 2015-06-03 DIAGNOSIS — I482 Chronic atrial fibrillation, unspecified: Secondary | ICD-10-CM

## 2015-06-03 LAB — CUP PACEART INCLINIC DEVICE CHECK
Battery Impedance: 157 Ohm
Battery Remaining Longevity: 118 mo
Battery Voltage: 2.8 V
Brady Statistic RV Percent Paced: 100 %
Date Time Interrogation Session: 20170216111310
Implantable Lead Implant Date: 20141010
Implantable Lead Implant Date: 20141010
Implantable Lead Location: 753859
Implantable Lead Location: 753860
Implantable Lead Model: 5076
Implantable Lead Model: 5076
Lead Channel Impedance Value: 537 Ohm
Lead Channel Impedance Value: 686 Ohm
Lead Channel Pacing Threshold Amplitude: 1 V
Lead Channel Pacing Threshold Amplitude: 1 V
Lead Channel Pacing Threshold Pulse Width: 0.4 ms
Lead Channel Pacing Threshold Pulse Width: 0.4 ms
Lead Channel Sensing Intrinsic Amplitude: 1.4 mV
Lead Channel Setting Pacing Amplitude: 2 V
Lead Channel Setting Pacing Pulse Width: 0.4 ms
Lead Channel Setting Sensing Sensitivity: 4 mV

## 2015-06-03 NOTE — Progress Notes (Signed)
HPI [name] returns today for followup. He is a very pleasant 65 year old man with a history of paroxysmal atrial fibrillation which had become persistent, associated with a very rapid ventricular response and heart rates in the 180 beats per minute. 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 He underwent AV node ablation and insertion of a dual-chamber pacemaker. In the interim he denies chest pain, shortness of breath, peripheral edema, or syncope. He has not been hospitalized. His appetite is good and he remains active. No bleeding despite taking both coumadin and plavix. No Known Allergies   Current Outpatient Prescriptions  Medication Sig Dispense Refill  . ADVAIR DISKUS 250-50 MCG/DOSE AEPB TAKE 1 INHALATION BY MOUTH TWICE DAILY INTO LUNGS 60 each 4  . amLODipine (NORVASC) 5 MG tablet TAKE ONE TABLET BY MOUTH ONCE DAILY. 30 tablet 7  . buPROPion (WELLBUTRIN SR) 150 MG 12 hr tablet TAKE 1 TABLET BY MOUTH TWICE DAILY. 60 tablet 5  . celecoxib (CELEBREX) 200 MG capsule TAKE ONE CAPSULE BY MOUTH DAILY. 30 capsule 3  . clopidogrel (PLAVIX) 75 MG tablet Take 1 tablet (75 mg total) by mouth daily. 90 tablet 3  . digoxin (LANOXIN) 0.125 MG tablet Take 1 tablet (0.125 mg total) by mouth daily. 30 tablet 11  . fish oil-omega-3 fatty acids 1000 MG capsule Take 1 g by mouth daily.      . furosemide (LASIX) 20 MG tablet TAKE ONE TABLET BY MOUTH DAILY. 30 tablet 5  . glucose blood test strip 1 each by Other route daily. Check Blood Sugar fasting each morning 100 each 3  . isosorbide mononitrate (IMDUR) 30 MG 24 hr tablet TAKE ONE TABLET BY MOUTH ONCE DAILY. 30 tablet 3  . lisinopril (PRINIVIL,ZESTRIL) 20 MG tablet TAKE ONE TABLET BY MOUTH ONCE DAILY. 30 tablet 5  . metFORMIN (GLUCOPHAGE) 500 MG tablet Take 500 mg by mouth daily with breakfast.    . metoprolol succinate (TOPROL-XL) 50 MG 24 hr tablet TAKE 3 TABLETS (150MG ) BY MOUTH TWICE  DAILY 180 tablet 5  . nitroGLYCERIN (NITROSTAT) 0.4 MG SL tablet Place 1 tablet (0.4 mg total) under the tongue every 5 (five) minutes as needed for chest pain. 25 tablet 1  . pantoprazole (PROTONIX) 40 MG tablet TAKE ONE TABLET BY MOUTH DAILY. 90 tablet 1  . promethazine (PHENERGAN) 25 MG tablet Take 1 tablet (25 mg total) by mouth every 8 (eight) hours as needed for nausea or vomiting. 20 tablet 0  . rosuvastatin (CRESTOR) 20 MG tablet TAKE ONE TABLET BY MOUTH BEFORE BEDTIME. 30 tablet 5  . warfarin (COUMADIN) 5 MG tablet Take 1 whole pill on Mondays only. All other days: take 1/2 pill daily. 30 tablet 3  . zolpidem (AMBIEN) 10 MG tablet Take 10 mg by mouth at bedtime as needed. for sleep  1   No current facility-administered medications for this visit.     Past Medical History  Diagnosis Date  . Persistent atrial fibrillation (HCC)     recurrent atrial flutter since 2001 s/p DCCVs, multiple failed AADs, h/o tachy-mediated cardiomyopathy  . Hypertension     with hypertensive heart disease  . Hyperlipidemia   . Atrial flutter ([place])     radiofrequency ablation in 2001  . Obesity   . Chronic anticoagulation     chronic Coumadin anticoagulation  . Tobacco abuse   . Chronic obstructive pulmonary disease ([place]) 04/20/2011  . GERD (gastroesophageal reflux disease)   .  CAD (coronary artery disease)     a. Nonobstructive. Cardiac cath in 2001-50% mid RI, normal LM, LAD, RCA b. cath 10/16/2014 95% mid RCA treated with DES, 99% ost D1 medical management due to small aneurysmal segment  . Shortness of breath     "can come on at any time" (03/14/2013)  . Sleep apnea     "dx'd; couldn't wear the mask" (03/14/2013)  . Diabetes mellitus, type 2 ([place])   . Arthritis     "knees and lower back" (03/14/2013)    ROS:   All systems reviewed and negative except as noted in the HPI.   Past Surgical History  Procedure Laterality Date  . Atrial flutter ablation  2002    atrial flutter; subsequently  developed atrial fibrillation  . Loop recorder implant  2002  . Cardioversion  05/31/2011    Procedure: CARDIOVERSION;  Surgeon: Cristopher Estimable. Lattie Haw, MD;  Location: [facility];  Service: Cardiovascular;  Laterality: N/A;  . Av node ablation  01/24/2013  . Insert / replace / remove pacemaker  01/24/2013     Medtronic Adapta L dual-chamber pacemaker, serial number [id number]   . Carpal tunnel release Left 1980's  . Tibial tuberclerplasty  ~ 2003  . Cardiac catheterization  2002  . Permanent pacemaker insertion N/A 01/24/2013    Procedure: PERMANENT PACEMAKER INSERTION;  Surgeon: [name], MD;  Location: [facility] CATH LAB;  Service: Cardiovascular;  Laterality: N/A;  . Left heart catheterization with coronary angiogram N/A 03/17/2013    Procedure: LEFT HEART CATHETERIZATION WITH CORONARY ANGIOGRAM;  Surgeon: Burnell Blanks, MD; LAD mild dz, D1 branch 100%, inferior branch 99%, CFX OK, RCA 50%, EF 65%    . Cardiac catheterization N/A 10/16/2014    Procedure: Left Heart Cath and Coronary Angiography;  Surgeon: [name], MD;  Location: [facility];  Service: Cardiovascular;  Laterality: N/A;     Family History  Problem Relation Age of Onset  . Alzheimer's disease Mother   . Osteoporosis Mother      Social History   Social History  . Marital Status: Legally Separated    Spouse Name: N/A  . Number of Children: 1  . Years of Education: N/A   Occupational History  . Unemployed   .     Social History Main Topics  . Smoking status: Former Smoker -- 1.00 packs/day for 42 years    Types: Cigarettes    Quit date: 12/31/2011  . Smokeless tobacco: Never Used  . Alcohol Use: Yes     Comment: 03/14/2013 "stopped drinking back in 2002; never had problem w/it"  . Drug Use: No  . Sexual Activity: Not Currently   Other Topics Concern  . 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           BP 146/94 mmHg  Pulse 102  Ht 6' (1.829 m)  Wt 226 lb (102.513 kg)  BMI 30.64 kg/m2  Physical Exam:  Well appearing middle aged man, NAD HEENT: Unremarkable Neck:  7 cm JVD, no thyromegally Lymphatics:  No adenopathy Back:  No CVA tenderness Lungs:  Clear with no wheezes HEART:  Regular rate rhythm, no murmurs, no rubs, no clicks Abd:  soft, positive bowel sounds, no organomegally, no rebound, no guarding Ext:  2 plus  pulses, no edema, no cyanosis, no clubbing Skin:  No rashes no nodules Neuro:  CN II through XII intact, motor grossly intact  DEVICE  Normal device function.  See PaceArt for details.   Assess/Plan: 1. Atrial fib with a controlled VR - he is pacing 99% of the time. He will continue his current meds 2. HTN - his blood pressure is stable.  3. PPM - his Medtronic device is working normally. Will recheck in several months. We have reprogrammed him to VVIR at 70/min from [place].  Mikle Bosworth.D.

## 2015-06-03 NOTE — Patient Instructions (Signed)
Medication Instructions:  Your physician recommends that you continue on your current medications as directed. Please refer to the Current Medication list given to you today.   Labwork: None ordered   Testing/Procedures: None ordered   Follow-Up: Remote monitoring is used to monitor your Pacemaker  from home. This monitoring reduces the number of office visits required to check your device to one time per year. It allows [place] to keep an eye on the functioning of your device to ensure it is working properly. You are scheduled for a device check from home on 09/02/15. You may send your transmission at any time that day. If you have a wireless device, the transmission will be sent automatically. After your physician reviews your transmission, you will receive a postcard with your next transmission date.  Your physician wants you to follow-up in: 12 months with Dr Knox Saliva will receive a reminder letter in the mail two months in advance. If you don't receive a letter, please call our office to schedule the follow-up appointment.     Any Other Special Instructions Will Be Listed Below (If Applicable).     If you need a refill on your cardiac medications before your next appointment, please call your pharmacy.

## 2015-06-14 ENCOUNTER — Other Ambulatory Visit: Payer: Self-pay | Admitting: Family Medicine

## 2015-06-14 MED ORDER — METOPROLOL SUCCINATE ER 50 MG PO TB24: ORAL_TABLET | ORAL | Status: DC

## 2015-06-14 NOTE — Telephone Encounter (Signed)
Medication refilled per protocol. 

## 2015-06-18 ENCOUNTER — Other Ambulatory Visit: Payer: Self-pay | Admitting: Physician Assistant

## 2015-06-18 NOTE — Telephone Encounter (Signed)
Medication refilled per protocol. 

## 2015-06-19 ENCOUNTER — Other Ambulatory Visit: Payer: Self-pay | Admitting: Physician Assistant

## 2015-06-21 NOTE — Telephone Encounter (Signed)
Medication refilled per protocol. 

## 2015-06-23 ENCOUNTER — Ambulatory Visit: Payer: Commercial Managed Care - HMO | Admitting: Physician Assistant

## 2015-06-28 ENCOUNTER — Encounter: Payer: Self-pay | Admitting: Physician Assistant

## 2015-06-28 ENCOUNTER — Ambulatory Visit (INDEPENDENT_AMBULATORY_CARE_PROVIDER_SITE_OTHER): Payer: Commercial Managed Care - HMO | Admitting: Physician Assistant

## 2015-06-28 VITALS — BP 118/72 | HR 96 | Temp 97.6°F | Resp 18 | Wt 219.0 lb

## 2015-06-28 DIAGNOSIS — E119 Type 2 diabetes mellitus without complications: Secondary | ICD-10-CM | POA: Diagnosis not present

## 2015-06-28 DIAGNOSIS — I481 Persistent atrial fibrillation: Secondary | ICD-10-CM | POA: Diagnosis not present

## 2015-06-28 DIAGNOSIS — Z7901 Long term (current) use of anticoagulants: Secondary | ICD-10-CM

## 2015-06-28 DIAGNOSIS — G4700 Insomnia, unspecified: Secondary | ICD-10-CM

## 2015-06-28 DIAGNOSIS — E785 Hyperlipidemia, unspecified: Secondary | ICD-10-CM

## 2015-06-28 DIAGNOSIS — I4892 Unspecified atrial flutter: Secondary | ICD-10-CM | POA: Diagnosis not present

## 2015-06-28 DIAGNOSIS — F329 Major depressive disorder, single episode, unspecified: Secondary | ICD-10-CM | POA: Diagnosis not present

## 2015-06-28 DIAGNOSIS — R7989 Other specified abnormal findings of blood chemistry: Secondary | ICD-10-CM

## 2015-06-28 DIAGNOSIS — Z720 Tobacco use: Secondary | ICD-10-CM

## 2015-06-28 DIAGNOSIS — J438 Other emphysema: Secondary | ICD-10-CM

## 2015-06-28 DIAGNOSIS — I2510 Atherosclerotic heart disease of native coronary artery without angina pectoris: Secondary | ICD-10-CM | POA: Diagnosis not present

## 2015-06-28 DIAGNOSIS — R972 Elevated prostate specific antigen [PSA]: Secondary | ICD-10-CM | POA: Diagnosis not present

## 2015-06-28 DIAGNOSIS — I4819 Other persistent atrial fibrillation: Secondary | ICD-10-CM

## 2015-06-28 DIAGNOSIS — Z125 Encounter for screening for malignant neoplasm of prostate: Secondary | ICD-10-CM

## 2015-06-28 DIAGNOSIS — B9689 Other specified bacterial agents as the cause of diseases classified elsewhere: Secondary | ICD-10-CM

## 2015-06-28 DIAGNOSIS — I10 Essential (primary) hypertension: Secondary | ICD-10-CM | POA: Diagnosis not present

## 2015-06-28 DIAGNOSIS — N182 Chronic kidney disease, stage 2 (mild): Secondary | ICD-10-CM

## 2015-06-28 DIAGNOSIS — R945 Abnormal results of liver function studies: Secondary | ICD-10-CM

## 2015-06-28 DIAGNOSIS — J988 Other specified respiratory disorders: Secondary | ICD-10-CM

## 2015-06-28 DIAGNOSIS — F32A Depression, unspecified: Secondary | ICD-10-CM

## 2015-06-28 DIAGNOSIS — I2583 Coronary atherosclerosis due to lipid rich plaque: Secondary | ICD-10-CM

## 2015-06-28 LAB — PT WITH INR/FINGERSTICK
INR, fingerstick: 3.1 — ABNORMAL HIGH (ref 0.80–1.20)
PT, fingerstick: 37.6 seconds — ABNORMAL HIGH (ref 10.4–12.5)

## 2015-06-28 LAB — HEMOGLOBIN A1C, FINGERSTICK: Hgb A1C (fingerstick): 6.6 % — ABNORMAL HIGH (ref <5.7)

## 2015-06-28 MED ORDER — DOXYCYCLINE HYCLATE 100 MG PO TABS: 100.0000 mg | ORAL_TABLET | Freq: Two times a day (BID) | ORAL | Status: DC

## 2015-06-28 NOTE — Progress Notes (Signed)
D   Patient ID: [name] Cook MRN: [id number], DOB: 06-15-1950, 65 y.o. Date of Encounter: @DATE @  Chief Complaint:  Chief Complaint  Patient presents with  . Diabetes    HPI: 65 y.o. year old white male  presents for followup office visit. Here to check his PT/INR.  Also for routine OV to f/u HTN, DM etc.  He had his first office visit with me on 06/19/2013.   He had been seen in our office in the past by [name].  ----First visit with him was 01/05/2011.  ----He only saw him one other visit and that  was 04/19/2011.  Since then, patient has been seeing [facility].  Had also gone [facility].  He had been having his PT INR checked at [facility].  However he wanted to start having it checked at our office.  He presented here on 06/19/13 for PT/INR check and also to re- establish primary care here.  COUMADIN: He is on Coumadin for history of atrial fibrillation/atrial flutter. Goal INR is 2-3. For a long time, his INR was therapeutic taking 2.5 mg on all days except for 5 mg on Mondays /  Wednesdays /  Fridays. However, recently, he has been having elevated INR and dose adjusted.  Now taking 5mg  on Mondays & Fridays. 2.5mg  all other days.  No skipped doses.  No bleeding.  No significant change in dietary intake of Vitamin K containing foods.  No change in medications.   DIABETES: I reviewed my lab result note attached to labs 10/01/14. That result note stated to stop the metformin. Also stated to decrease Januvia from 100 mg to 50 mg. However, today patient states that he is taking metformin just one in the morning only. Says that he is now off of Januvia. Says that the metformin is the only medicine he is currently on for diabetes.. He has not been checking his blood sugar much recently.  At [place] 04/13/14--he said he was having diarrhea for months. No abdominal pain--just diarrhea.  IDecreased Metformin--At OV 06/22/14  pt says diarrhea resolved since decreasing the dose of Metformin.  HYPERTENSION: He is taking blood pressure medications as directed. No lightheadedness. No lower extremity edema.  HYPERLIPIDEMIA: He is taking his Crestor as directed. No myalgias or other adverse effects.  CAD: He presented to me with an office visit 10/14/14 with complaints consistent with angina. Subsequently was admitted to the hospital and underwent PTCA. As well he had a prior history of CAD and stenting in the past. This is currently stable with no angina symptoms.  DEPRESSION: At [place] 03/09/2014 he says that he did want to discuss one thing. Says that he has been on medications for depression in the past but then he was without insurance for a while so he had gone off of those medications. He is feeling that he does need to get back on medication for this.  Says that he does not actually have episodes of crying but says that he does feel depressed and feels like he is not really interested in doing things that he used to find pleasure in. He says that there are no unusual stressors going on in his life right now says that things are same as usual but he just doesn't feel right. Feels depressed as above. Also says that it seems to be affecting his sleep and that he will sometimes wake up in the night thinking of things. Says that  [name] had him on a medication that he prescribed here but says that he doesn't think that worked very well but cannot remember the name of it. I reviewed his chart and it looks like that was Lexapro. Patient says that he also knows in the past he has been on Wellbutrin at one point and was on Prozac at one point. He says that he remembers that the Wellbutrin worked well for him. At that OV I Rxed Wellbutrin. AT OV 04/13/14-- Pt says he is taking this as directed---now taking BID. He cant tell whether it ishelping much yet or not.   At [place] 09/02/2014--he said that he thought he needed to get on  Lexapro that we have discussed in the past. He said he was taking the Wellbutrin but it did not seem to be controlling his depression symptoms. Michela Pitcher that recently he had not been sleeping very well. Was waking up at about 2 or 3 AM and had hard time getting back to sleep. General did not seem very interested in doing things. Decreased motivation. Decreased pleasure in things that he used to have pleasure with. At that visit he was interested in adding Lexapro to Wellbutrin. Added Lexapro 10 mg daily.  10/01/2014--Today he states that he has noticed no difference since he added the Lexapro. Says that in the past he did feel that the Wellbutrin was helping and feels that he should continue this but says he is not feeling any additional improvement with adding the Lexapro. He feels that he has a "negative attitude all the time "even when there is nothing to be negative about. Says that he is still having problems with insomnia. Says "last night and went to bed at 10 PM woke up at 2 AM. Rate from 2 AM to 3:30. It did go back to sleep around 3:30 but woke at 6 AM. At that visit weaned off of the Lexapro. He is now on Wellbutrin and this is stable.  INSOMNIA: He is now taking Ambien 10 mg daily at bedtime and this is working well for his insomnia.   At last 2-3 OVs, I reviewed that the last PSA was elevated and that I referred to urology but he had deferred secondary to cost.  Finally,  at last visit we did get him scheduled with urology and discussed cost with him. At [place] with me 03/22/15--he reported to me that he had his appointment with them the following day-- December 6.  At [place] 06/28/2015:  He reports that he has had head and nasal congestion as well as chest congestion for 2 weeks now. He also reports that "Z-Pak has gotten to where he doesn't do anything for me ". No Sore throat. No fevers or chills.  Past Medical History  Diagnosis Date  . Persistent atrial fibrillation (HCC)     recurrent atrial  flutter since 2001 s/p DCCVs, multiple failed AADs, h/o tachy-mediated cardiomyopathy  . Hypertension     with hypertensive heart disease  . Hyperlipidemia   . Atrial flutter ([place])     radiofrequency ablation in 2001  . Obesity   . Chronic anticoagulation     chronic Coumadin anticoagulation  . Tobacco abuse   . Chronic obstructive pulmonary disease ([place]) 04/20/2011  . GERD (gastroesophageal reflux disease)   . CAD (coronary artery disease)     a. Nonobstructive. Cardiac cath in 2001-50% mid RI, normal LM, LAD, RCA b. cath 10/16/2014 95% mid RCA treated with DES, 99% ost D1 medical management  due to small aneurysmal segment  . Shortness of breath     "can come on at any time" (03/14/2013)  . Sleep apnea     "dx'd; couldn't wear the mask" (03/14/2013)  . Diabetes mellitus, type 2 ([place])   . Arthritis     "knees and lower back" (03/14/2013)     Home Meds:  Outpatient Prescriptions Prior to Visit  Medication Sig Dispense Refill  . ADVAIR DISKUS 250-50 MCG/DOSE AEPB TAKE 1 INHALATION BY MOUTH TWICE DAILY INTO LUNGS 60 each 4  . amLODipine (NORVASC) 5 MG tablet TAKE ONE TABLET BY MOUTH ONCE DAILY. 30 tablet 7  . buPROPion (WELLBUTRIN SR) 150 MG 12 hr tablet TAKE 1 TABLET BY MOUTH TWICE DAILY. 60 tablet 5  . celecoxib (CELEBREX) 200 MG capsule TAKE 1 CAPSULE BY MOUTH DAILY 30 capsule 5  . clopidogrel (PLAVIX) 75 MG tablet Take 1 tablet (75 mg total) by mouth daily. 90 tablet 3  . digoxin (LANOXIN) 0.125 MG tablet Take 1 tablet (0.125 mg total) by mouth daily. 30 tablet 11  . escitalopram (LEXAPRO) 10 MG tablet TAKE 1 TABLET BY MOUTH ONCE DAILY 30 tablet 5  . fish oil-omega-3 fatty acids 1000 MG capsule Take 1 g by mouth daily.      . furosemide (LASIX) 20 MG tablet TAKE ONE TABLET BY MOUTH DAILY. 30 tablet 5  . glucose blood test strip 1 each by Other route daily. Check Blood Sugar fasting each morning 100 each 3  . isosorbide mononitrate (IMDUR) 30 MG 24 hr tablet TAKE 1 TABLET BY MOUTH  ONCE DAILY 90 tablet 1  . lisinopril (PRINIVIL,ZESTRIL) 20 MG tablet TAKE ONE TABLET BY MOUTH ONCE DAILY. 30 tablet 5  . metFORMIN (GLUCOPHAGE) 500 MG tablet Take 500 mg by mouth daily with breakfast.    . metoprolol succinate (TOPROL-XL) 50 MG 24 hr tablet TAKE 3 TABLETS (150MG ) BY MOUTH TWICE DAILY 180 tablet 5  . nitroGLYCERIN (NITROSTAT) 0.4 MG SL tablet Place 1 tablet (0.4 mg total) under the tongue every 5 (five) minutes as needed for chest pain. 25 tablet 1  . pantoprazole (PROTONIX) 40 MG tablet TAKE ONE TABLET BY MOUTH DAILY. 90 tablet 1  . promethazine (PHENERGAN) 25 MG tablet Take 1 tablet (25 mg total) by mouth every 8 (eight) hours as needed for nausea or vomiting. 20 tablet 0  . rosuvastatin (CRESTOR) 20 MG tablet TAKE ONE TABLET BY MOUTH BEFORE BEDTIME. 30 tablet 5  . warfarin (COUMADIN) 5 MG tablet Take 1 whole pill on Mondays only. All other days: take 1/2 pill daily. 30 tablet 3  . zolpidem (AMBIEN) 10 MG tablet Take 10 mg by mouth at bedtime as needed. for sleep  1   No facility-administered medications prior to visit.     Allergies: No Known Allergies  Social History   Social History  . Marital Status: Legally Separated    Spouse Name: N/A  . Number of Children: 1  . Years of Education: N/A   Occupational History  . Unemployed   .     Social History Main Topics  . Smoking status: Former Smoker -- 1.00 packs/day for 42 years    Types: Cigarettes    Quit date: 12/31/2011  . Smokeless tobacco: Never Used  . Alcohol Use: Yes     Comment: 03/14/2013 "stopped drinking back in 2002; never had problem w/it"  . Drug Use: No  . Sexual Activity: Not Currently   Other Topics Concern  . 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          Family History  Problem Relation Age of Onset  .  Alzheimer's disease Mother   . Osteoporosis Mother      Review of Systems:  See HPI for pertinent ROS. All other ROS negative.    Physical Exam: Blood pressure 118/72, pulse 96, temperature 97.6 F (36.4 C), resp. rate 18, weight 219 lb (99.338 kg)., Body mass index is 29.7 kg/(m^2). General: WNWD WM. Appears in no acute distress. Neck: Supple. No thyromegaly. No lymphadenopathy. No carotid bruits. Lungs: Clear bilaterally to auscultation without wheezes, rales, or rhonchi. Breathing is unlabored. Heart: Irreg rhythm Abdomen: Soft, non-tender, non-distended with normoactive bowel sounds. No hepatomegaly. No rebound/guarding. No obvious abdominal masses. Musculoskeletal:  Strength and tone normal for age. Extremities/Skin: Warm and dry. No clubbing or cyanosis. No edema. No rashes or suspicious lesions. Neuro: Alert and oriented X 3. Moves all extremities spontaneously. Gait is normal. CNII-XII grossly in tact. Psych:  Responds to questions appropriately with a normal affect. Diabetic foot exam: Inspection is normal.  Sensation is intact and normal.  He has 2+ posterior tibial pulses bilaterally. Trace dorsalis pedis pulses bilaterally.       ASSESSMENT AND PLAN:  65 y.o. year old male with   FINANCIAL DISTRESS---SEE # 12 AND # 13 BELOW. ---HAS NOT BEEN ABLE TO AFFORD COPAY TO SEE GI OR UROLOGY.  FINALLY, IS GOING TO SEE UROLOGY---Appt 03/23/2015                                      ----ALSO, SEE HPI---PRIOR TO COMING TO THIS OFFICE, WENT TO [facility] CLINIC [facility] DEPARTMENT    -----WILL HAVE OUR STAFF SEE IF THN OR SOME OTHER ASSISTANCE CAN HELP HIM   ADDENDUM: Received note from Urology dated 03/23/2015:  Their diagnosis includes: Benign localized hyperplasia of prostate with urinary obstruction Intermittent urinary stream Weak urinary stream Nocturia  Their plan includes: Complex uroflowmetry Cystoscopy Prostate ultrasound PSA reflex to free PVR ultrasound     He  states he has Medicare secondary to Disability--cannot use savings cards with Medicare.   Bacterial Respiratory Infection --Doxycycline 100 mg 1 by mouth twice a day 7 days. Follow up if symptoms do not resolve after completion of antibiotic.  Coronary Artery Disease He underwent cardiac catheterization 03/17/2013. Was not favorable for PCI. He has had medical treatment of CAD. 10/14/14 he was admitted to the hospital with angina. He subsequently underwent PCI. This is managed by cardiology.   Chronic anticoagulation---PT/INR today 3.1.  Continue current Coumadin dose. Recheck PT/INR in 4 weeks.  Atrial fibrillation           This is managed by Cardiology and also EP-- [name]  Atrial flutter            This is managed by Cardiology and also EP-- [name] Pacemaker             This is managed by Cardiology and also EP-- [name]  Depression This is stable and controlled. Continue Wellbutrin.  Insomnia This is well controlled with Ambien 10 mg.  Diabetes mellitus, type 2  03/22/2015 OV: I reviewed my lab result note attached to labs  10/01/14. That result note stated to stop the metformin. Also stated to decrease Januvia from 100 mg to 50 mg. However, today patient states that he is taking metformin just one in the morning only. Says that he is now off of Januvia. Says that the metformin is the only medicine he is currently on for diabetes.. Also, current med list shows Metformin as the only Diabetes Medication at present time.   - Microalbumin, urine---done 06/26/2013, 10/01/2014 Check A1C today  He is on ACE inhibitor. He is on statin therapy. Since he recent PCI, Per Cardiology, he is on ASA 81mg ,  Plavix and Coumadin per Cardiology.   Foot exam is stable. Patient states that he had an eye exam about one year ago. Is due to schedule followup and patient plans to schedule this. He was told that exam showed "no diabetic damage " At [place] 10/01/2014 he states that his eye exam  is due "right now ". I wrote on his AVS to remind him to call to schedule follow-up eye exam. At [place] 03/22/15 he states that he still has not seen ophthalmologist. He is aware that he needs to do so.  He received Pneumovax 23 here 01/05/2011 No further pneumonia vaccine indicated until age 33  Hypertension At goal. On ACE inhibitor.  Check labs to monitor.  Hyperlipidemia At last lab -- LFTs normal.Last Lipid Panel--10/01/2014--Excellent--triglyceride 86. HDL 47. LDL 70. Lipid Panel is excellent. Continued Crestor 20 mg. He is not fasting today so will wait to recheck FLP. Will check LFTs.  Chronic obstructive pulmonary disease Quit smoking 2013. He is on Advair. His breathing is stable on current medications.  GASTROESOPHAGEAL REFLUX DISEASE Protonix can be used with Plavix to continue this one.  Prostate cancer screening -- Elevated PSA -PSA was elevated 02/05/2014.--4.91.  I ordered referral to urology. Patient reported that he could not afford the co-pay and did not follow-up with urology.  PSA repeated 06/22/2014---4.71---I had noted for staff to see if  can find any type of assistance program to help cover cost so that he can follow-up with urology.However, this has not happened.  AT [place] 10/01/2014--I am discussing with staff again to investigate options for financial assistance/other services, options to help him. AT [place] 03/22/2015--- he states that he has his appointment with urology tomorrow and is planning to follow-up and go to that appointment.   Screening for colorectal cancer The following is copied form his OV on 06/26/2013:   "He reports that he has never had a colonoscopy. Says in the past he was going to -but that's when he then lost his insurance. He is agreeable to proceed with this and is agreeable for me to do her referral for followup with GI. - Ambulatory referral to Gastroenterology "  At f/u OV 11/06/2013 he reports: He cannot even afford the co-pay to see GI for the  office visit. His co-pay for that visit was going to be $45. He does not even know what his cost would be for the colonoscopy itself.   Immunizations: He received Pneumovax 23 here in the office on 01/05/2011 No further pneumonia vaccine indicated until age 68. He received tetanus here at our office 01/05/2011 Influenza Vaccine----Given here 04/13/2014, 01/04/2015 Zostavax: So far, have not even discussed, as cost will likely be prohibitive     Routine visit follow-up diabetes etc. 3 months.      [address] [place], [place], [facility] 06/28/2015 12:30 PM

## 2015-07-19 ENCOUNTER — Telehealth: Payer: Self-pay | Admitting: Family Medicine

## 2015-07-19 ENCOUNTER — Telehealth: Payer: Self-pay | Admitting: Physician Assistant

## 2015-07-19 MED ORDER — METOPROLOL SUCCINATE ER 50 MG PO TB24: 50.0000 mg | ORAL_TABLET | Freq: Every day | ORAL | Status: DC

## 2015-07-19 MED ORDER — METOPROLOL SUCCINATE ER 100 MG PO TB24: 100.0000 mg | ORAL_TABLET | Freq: Two times a day (BID) | ORAL | Status: DC

## 2015-07-19 MED ORDER — METOPROLOL SUCCINATE ER 100 MG PO TB24: 100.0000 mg | ORAL_TABLET | Freq: Every day | ORAL | Status: DC

## 2015-07-19 MED ORDER — PROMETHAZINE HCL 25 MG PO TABS: 25.0000 mg | ORAL_TABLET | Freq: Three times a day (TID) | ORAL | Status: DC | PRN

## 2015-07-19 NOTE — Telephone Encounter (Signed)
Insurance complaining about pt taking six 50 mgToprol XL a day to equal 150 mg BID dose.   Per their request, changed to 100 mg and 50 mg tablet to decrease amount of medication dispensed.  Correct doses called into pharmacy.

## 2015-07-19 NOTE — Telephone Encounter (Signed)
Ok

## 2015-07-19 NOTE — Telephone Encounter (Signed)
Patient calling to get refill on his phenergan But needs to go to [facility]

## 2015-07-19 NOTE — Telephone Encounter (Signed)
Medication refilled per protocol. 

## 2015-07-26 ENCOUNTER — Ambulatory Visit (INDEPENDENT_AMBULATORY_CARE_PROVIDER_SITE_OTHER): Payer: Commercial Managed Care - HMO | Admitting: Physician Assistant

## 2015-07-26 VITALS — BP 134/84 | HR 88 | Temp 97.3°F | Resp 18 | Wt 222.0 lb

## 2015-07-26 DIAGNOSIS — Z7901 Long term (current) use of anticoagulants: Secondary | ICD-10-CM | POA: Diagnosis not present

## 2015-07-26 LAB — PT WITH INR/FINGERSTICK
INR, fingerstick: 2.7 — ABNORMAL HIGH (ref 0.80–1.20)
PT, fingerstick: 32.3 seconds — ABNORMAL HIGH (ref 10.4–12.5)

## 2015-07-26 NOTE — Progress Notes (Signed)
Patient ID: [name] Cook MRN: [id number], DOB: [birth year], 65 y.o. Date of Encounter: 07/26/2015, 11:09 AM    Chief Complaint:  Chief Complaint  Patient presents with  . here for PT/INR check     HPI: 65 y.o. year old male here for PT/INR check.   She is taking Coumadin as directed. No skipped doses. No recent medication changes otherwise. His had no bleeding. Coumadin as an 5 mg. He takes a whole 5 mg on Monday only. Takes a half all other days for 2.5 mg those days.     Home Meds:   Outpatient Prescriptions Prior to Visit  Medication Sig Dispense Refill  . ADVAIR DISKUS 250-50 MCG/DOSE AEPB TAKE 1 INHALATION BY MOUTH TWICE DAILY INTO LUNGS 60 each 4  . amLODipine (NORVASC) 5 MG tablet TAKE ONE TABLET BY MOUTH ONCE DAILY. 30 tablet 7  . buPROPion (WELLBUTRIN SR) 150 MG 12 hr tablet TAKE 1 TABLET BY MOUTH TWICE DAILY. 60 tablet 5  . celecoxib (CELEBREX) 200 MG capsule TAKE 1 CAPSULE BY MOUTH DAILY 30 capsule 5  . clopidogrel (PLAVIX) 75 MG tablet Take 1 tablet (75 mg total) by mouth daily. 90 tablet 3  . digoxin (LANOXIN) 0.125 MG tablet Take 1 tablet (0.125 mg total) by mouth daily. 30 tablet 11  . escitalopram (LEXAPRO) 10 MG tablet TAKE 1 TABLET BY MOUTH ONCE DAILY 30 tablet 5  . fish oil-omega-3 fatty acids 1000 MG capsule Take 1 g by mouth daily.      . furosemide (LASIX) 20 MG tablet TAKE ONE TABLET BY MOUTH DAILY. 30 tablet 5  . glucose blood test strip 1 each by Other route daily. Check Blood Sugar fasting each morning 100 each 3  . isosorbide mononitrate (IMDUR) 30 MG 24 hr tablet TAKE 1 TABLET BY MOUTH ONCE DAILY 90 tablet 1  . lisinopril (PRINIVIL,ZESTRIL) 20 MG tablet TAKE ONE TABLET BY MOUTH ONCE DAILY. 30 tablet 5  . metFORMIN (GLUCOPHAGE) 500 MG tablet Take 500 mg by mouth daily with breakfast.    . metoprolol succinate (TOPROL-XL) 100 MG 24 hr tablet Take 1 tablet (100 mg total) by mouth 2 (two) times daily. Take with or immediately following a meal.  Take with  50 mg tablet to equal 150 mg dose. 180 tablet 1  . metoprolol succinate (TOPROL-XL) 50 MG 24 hr tablet Take 50 mg by mouth 2 (two) times daily. Take with or immediately following a meal. Take with 100 mg tablet to equal 150 mg dose.    . nitroGLYCERIN (NITROSTAT) 0.4 MG SL tablet Place 1 tablet (0.4 mg total) under the tongue every 5 (five) minutes as needed for chest pain. 25 tablet 1  . pantoprazole (PROTONIX) 40 MG tablet TAKE ONE TABLET BY MOUTH DAILY. 90 tablet 1  . promethazine (PHENERGAN) 25 MG tablet Take 1 tablet (25 mg total) by mouth every 8 (eight) hours as needed for nausea or vomiting. 20 tablet 0  . rosuvastatin (CRESTOR) 20 MG tablet TAKE ONE TABLET BY MOUTH BEFORE BEDTIME. 30 tablet 5  . warfarin (COUMADIN) 5 MG tablet Take 1 whole pill on Mondays only. All other days: take 1/2 pill daily. 30 tablet 3  . zolpidem (AMBIEN) 10 MG tablet Take 10 mg by mouth at bedtime as needed. for sleep  1  . doxycycline (VIBRA-TABS) 100 MG tablet Take 1 tablet (100 mg total) by mouth 2 (two) times daily. 20 tablet 0   No facility-administered medications prior to visit.  Allergies: No Known Allergies    Review of Systems: See HPI for pertinent ROS. All other ROS negative.    Physical Exam: Blood pressure 134/84, pulse 88, temperature 97.3 F (36.3 C), temperature source Oral, resp. rate 18, weight 222 lb (100.699 kg)., Body mass index is 30.1 kg/(m^2). General:  Appears in no acute distress. Neck: Supple. No thyromegaly. No lymphadenopathy. Lungs: Clear bilaterally to auscultation without wheezes, rales, or rhonchi. Breathing is unlabored. Heart: Irregular rhythm. Msk:  Strength and tone normal for age. Extremities/Skin: Warm and dry.  Neuro: Alert and oriented X 3. Moves all extremities spontaneously. Gait is normal. CNII-XII grossly in tact. Psych:  Responds to questions appropriately with a normal affect.    Results for orders placed or performed in visit on 07/26/15  PT with  INR/Fingerstick  Result Value Ref Range   PT, fingerstick 32.3 (H) 10.4 - 12.5 seconds   INR, fingerstick 2.7 (H) 0.80 - 1.20    ASSESSMENT AND PLAN:  65 y.o. year old male with  1. Long term current use of anticoagulant therapy - PT with INR/Fingerstick  INR therapeutic. Continue current dose of Coumadin. 5 mg on Mondays. 2.5 mg all other days. Return to recheck in 4 weeks.   Signed, [address] [place], [place], [facility] 07/26/2015 11:09 AM

## 2015-08-14 ENCOUNTER — Other Ambulatory Visit: Payer: Self-pay | Admitting: Physician Assistant

## 2015-08-16 NOTE — Telephone Encounter (Signed)
Medication refilled per protocol. 

## 2015-08-25 ENCOUNTER — Encounter: Payer: Self-pay | Admitting: Physician Assistant

## 2015-08-25 ENCOUNTER — Ambulatory Visit (INDEPENDENT_AMBULATORY_CARE_PROVIDER_SITE_OTHER): Payer: Commercial Managed Care - HMO | Admitting: Physician Assistant

## 2015-08-25 VITALS — BP 124/74 | HR 80 | Temp 97.4°F | Resp 18 | Wt 225.0 lb

## 2015-08-25 DIAGNOSIS — Z7901 Long term (current) use of anticoagulants: Secondary | ICD-10-CM | POA: Diagnosis not present

## 2015-08-25 LAB — PT WITH INR/FINGERSTICK
INR, fingerstick: 3.7 — ABNORMAL HIGH (ref 0.80–1.20)
PT, fingerstick: 44 seconds — ABNORMAL HIGH (ref 10.4–12.5)

## 2015-08-25 NOTE — Progress Notes (Signed)
Patient ID: [name] Cook MRN: [id number], DOB: 1950-12-28, 65 y.o. Date of Encounter: 08/25/2015, 1:19 PM    Chief Complaint:  Chief Complaint  Patient presents with  . PT/INR     HPI: 65 y.o. year old male here for INR check.   Says he has continued to use pill box. Therefore, knows he is not accidentally skipping any doses or accidentally doubling any doses.  Coumadin is 5mg  tabs---Take whole on Mondays only. Takes 1/2 all other days.  Has had no bleeding. Does have bruising on forearms bilaterally. Says they are secondary to working on his lawn mower recently--had to change a belt.  No complaint/concern today.     Home Meds:   Outpatient Prescriptions Prior to Visit  Medication Sig Dispense Refill  . ADVAIR DISKUS 250-50 MCG/DOSE AEPB TAKE 1 INHALATION BY MOUTH TWICE DAILY INTO LUNGS 60 each 4  . amLODipine (NORVASC) 5 MG tablet TAKE ONE TABLET BY MOUTH ONCE DAILY. 30 tablet 7  . buPROPion (WELLBUTRIN SR) 150 MG 12 hr tablet TAKE 1 TABLET BY MOUTH TWICE DAILY. 60 tablet 5  . celecoxib (CELEBREX) 200 MG capsule TAKE 1 CAPSULE BY MOUTH DAILY 30 capsule 5  . clopidogrel (PLAVIX) 75 MG tablet Take 1 tablet (75 mg total) by mouth daily. 90 tablet 3  . digoxin (LANOXIN) 0.125 MG tablet Take 1 tablet (0.125 mg total) by mouth daily. 30 tablet 11  . escitalopram (LEXAPRO) 10 MG tablet TAKE 1 TABLET BY MOUTH ONCE DAILY 30 tablet 5  . fish oil-omega-3 fatty acids 1000 MG capsule Take 1 g by mouth daily.      . furosemide (LASIX) 20 MG tablet TAKE ONE TABLET BY MOUTH DAILY. 30 tablet 5  . glucose blood test strip 1 each by Other route daily. Check Blood Sugar fasting each morning 100 each 3  . isosorbide mononitrate (IMDUR) 30 MG 24 hr tablet TAKE 1 TABLET BY MOUTH ONCE DAILY 90 tablet 1  . lisinopril (PRINIVIL,ZESTRIL) 20 MG tablet TAKE ONE TABLET BY MOUTH ONCE DAILY. 30 tablet 5  . metFORMIN (GLUCOPHAGE) 500 MG tablet Take 500 mg by mouth daily with breakfast.    . metoprolol  succinate (TOPROL-XL) 100 MG 24 hr tablet Take 1 tablet (100 mg total) by mouth 2 (two) times daily. Take with or immediately following a meal.  Take with 50 mg tablet to equal 150 mg dose. 180 tablet 1  . metoprolol succinate (TOPROL-XL) 50 MG 24 hr tablet Take 50 mg by mouth 2 (two) times daily. Take with or immediately following a meal. Take with 100 mg tablet to equal 150 mg dose.    . nitroGLYCERIN (NITROSTAT) 0.4 MG SL tablet Place 1 tablet (0.4 mg total) under the tongue every 5 (five) minutes as needed for chest pain. 25 tablet 1  . pantoprazole (PROTONIX) 40 MG tablet TAKE ONE TABLET BY MOUTH DAILY. 90 tablet 1  . promethazine (PHENERGAN) 25 MG tablet Take 1 tablet (25 mg total) by mouth every 8 (eight) hours as needed for nausea or vomiting. 20 tablet 0  . rosuvastatin (CRESTOR) 20 MG tablet TAKE ONE TABLET BY MOUTH BEFORE BEDTIME. 30 tablet 5  . warfarin (COUMADIN) 5 MG tablet TAKE 1 TABLET BY MOUTH ON MONDAYS AND 1/2 TABLET ALL OTHER DAYS 30 tablet 5  . zolpidem (AMBIEN) 10 MG tablet Take 10 mg by mouth at bedtime as needed. Reported on 08/25/2015  1   No facility-administered medications prior to visit.    Allergies: No  Known Allergies    Review of Systems: See HPI for pertinent ROS. All other ROS negative.    Physical Exam: Blood pressure 124/74, pulse 80, temperature 97.4 F (36.3 C), temperature source Oral, resp. rate 18, weight 225 lb (102.059 kg)., Body mass index is 30.51 kg/(m^2). General:  WNWD WM. Appears in no acute distress. Neck: Supple. No thyromegaly. No lymphadenopathy. Lungs: Clear bilaterally to auscultation without wheezes, rales, or rhonchi. Breathing is unlabored. Heart: Irregular rhythm. Msk:  Strength and tone normal for age. Extremities/Skin: Purple ecchymosis on forearms bilaterally.  Neuro: Alert and oriented X 3. Moves all extremities spontaneously. Gait is normal. CNII-XII grossly in tact. Psych:  Responds to questions appropriately with a normal  affect.   Results for orders placed or performed in visit on 08/25/15  PT with INR/Fingerstick  Result Value Ref Range   PT, fingerstick 44.0 (H) 10.4 - 12.5 seconds   INR, fingerstick 3.7 (H) 0.80 - 1.20     ASSESSMENT AND PLAN:  65 y.o. year old male with  1. Long term current use of anticoagulant therapy - PT with INR/Fingerstick  INR supratherapeutic.  He has already taken today's dose of Coumadin.  He is to skip dose tomorrow.  (2.5mg ) Then he is to resume usual dose of Coumadin. Recheck INR one week.   Reviewed chart---It was 12/2014 - 01/2015 that he developed supratherapeutic INRs and dose had ot be adjusted.  Since then, has been therapeutic. Most recently:  04/05/2015---------------3.2 05/24/2015-----------------2.8 06/28/2014----------------3.1 07/26/2015------------------2.7  Therefore, do not want to change dose in general and cause INR to get subtherapeutic.  However, if INR high again at next check, then will adjust dose.   Signed, [address] [place], [place], [facility] 08/25/2015 1:19 PM

## 2015-08-30 ENCOUNTER — Ambulatory Visit: Payer: Commercial Managed Care - HMO | Admitting: Physician Assistant

## 2015-09-01 ENCOUNTER — Encounter: Payer: Self-pay | Admitting: Family Medicine

## 2015-09-01 ENCOUNTER — Ambulatory Visit (INDEPENDENT_AMBULATORY_CARE_PROVIDER_SITE_OTHER): Payer: Commercial Managed Care - HMO | Admitting: Family Medicine

## 2015-09-01 VITALS — BP 128/78 | HR 90 | Temp 97.6°F | Resp 18 | Ht 72.0 in | Wt 218.0 lb

## 2015-09-01 DIAGNOSIS — J069 Acute upper respiratory infection, unspecified: Secondary | ICD-10-CM

## 2015-09-01 DIAGNOSIS — I4891 Unspecified atrial fibrillation: Secondary | ICD-10-CM | POA: Diagnosis not present

## 2015-09-01 DIAGNOSIS — Z7901 Long term (current) use of anticoagulants: Secondary | ICD-10-CM

## 2015-09-01 DIAGNOSIS — J438 Other emphysema: Secondary | ICD-10-CM | POA: Diagnosis not present

## 2015-09-01 DIAGNOSIS — I481 Persistent atrial fibrillation: Secondary | ICD-10-CM | POA: Diagnosis not present

## 2015-09-01 DIAGNOSIS — I4819 Other persistent atrial fibrillation: Secondary | ICD-10-CM

## 2015-09-01 LAB — PT WITH INR/FINGERSTICK
INR, fingerstick: 2.7 — ABNORMAL HIGH (ref 0.80–1.20)
PT, fingerstick: 32.6 seconds — ABNORMAL HIGH (ref 10.4–12.5)

## 2015-09-01 MED ORDER — AZITHROMYCIN 250 MG PO TABS: ORAL_TABLET | ORAL | Status: DC

## 2015-09-01 MED ORDER — AMOXICILLIN-POT CLAVULANATE 875-125 MG PO TABS
1.0000 | ORAL_TABLET | Freq: Two times a day (BID) | ORAL | Status: DC
Start: 2015-09-01 — End: 2016-03-10

## 2015-09-01 NOTE — Assessment & Plan Note (Signed)
Reviewed chart and discussed with pt, he is on both plavix and coumadin Coumadin is at goal 2.7 today, recheck in 4 weeks

## 2015-09-01 NOTE — Patient Instructions (Signed)
Return in 4 weeks for coumadin check with [facility]e Take antibiotics, Use [place] for cough

## 2015-09-01 NOTE — Progress Notes (Signed)
Patient ID: [name] Cook, male   DOB: [birth year], 65 y.o.   MRN: [id number]   Subjective:    Patient ID: [name] Cook, male    DOB: 1950/12/16, 65 y.o.   MRN: [id number]  Patient presents for Illness and PT/INR Patient for INR check he is on Coumadin secondary to atrial fibrillation his last INR was 3.7 slightly supratherapeutic use here for recheck from last week.  He's had cough with production chest congestion sinus drainage subjective fever/chills for the past 3 days he has history of COPD, took 1 dose of nyquil Sputum has changed to thick green     Review Of Systems:  GEN- denies fatigue, fever, weight loss,weakness, recent illness HEENT- denies eye drainage, change in vision,+nasal discharge, CVS- denies chest pain, palpitations RESP- denies SOB, +cough, wheeze ABD- denies N/V, change in stools, abd pain Neuro- denies headache, dizziness, syncope, seizure activity       Objective:    BP 128/78 mmHg  Pulse 90  Temp(Src) 97.6 F (36.4 C) (Oral)  Resp 18  Ht 6' (1.829 m)  Wt 218 lb (98.884 kg)  BMI 29.56 kg/m2  SpO2 97% GEN- NAD, alert and oriented x3 HEENT- PERRL, EOMI, non injected sclera, pink conjunctiva, MMM, oropharynx clear, TM clear bilat no effusion, no  maxillary sinus tenderness, +  Nasal drainage  Neck- Supple, no LAD CVS- RRR, no murmur RESP-upper airway congestion, wheeze, clears with cough, normal WOb, no rales  EXT- No edema Pulses- Radial 2+         Assessment & Plan:      Problem List Items Addressed This Visit    Long term current use of anticoagulant therapy   Chronic obstructive pulmonary disease (HCC) - Primary   Relevant Medications   azithromycin (ZITHROMAX) 250 MG tablet   Atrial fibrillation    Reviewed chart and discussed with pt, he is on both plavix and coumadin Coumadin is at goal 2.7 today, recheck in 4 weeks        Other Visit Diagnoses    Acute URI        More URI, but underlying COPD, with change in mucous production, no  wheezing or true decompensation of COPD, continue advair, given augmentin since he has some sinus involvement    Relevant Medications    azithromycin (ZITHROMAX) 250 MG tablet       Note: This dictation was prepared with Dragon dictation along with smaller phrase technology. Any transcriptional errors that result from this process are unintentional.

## 2015-09-13 ENCOUNTER — Other Ambulatory Visit: Payer: Self-pay | Admitting: Physician Assistant

## 2015-09-14 NOTE — Telephone Encounter (Signed)
Medication refilled per protocol. 

## 2015-09-19 ENCOUNTER — Other Ambulatory Visit: Payer: Self-pay | Admitting: Physician Assistant

## 2015-09-27 ENCOUNTER — Ambulatory Visit (INDEPENDENT_AMBULATORY_CARE_PROVIDER_SITE_OTHER): Payer: Commercial Managed Care - HMO | Admitting: Physician Assistant

## 2015-09-27 ENCOUNTER — Encounter: Payer: Self-pay | Admitting: Physician Assistant

## 2015-09-27 VITALS — BP 124/80 | HR 80 | Temp 97.4°F | Resp 18 | Wt 229.0 lb

## 2015-09-27 DIAGNOSIS — Z7901 Long term (current) use of anticoagulants: Secondary | ICD-10-CM

## 2015-09-27 LAB — PT WITH INR/FINGERSTICK
INR, fingerstick: 2.5 — ABNORMAL HIGH (ref 0.80–1.20)
PT, fingerstick: 30.3 seconds — ABNORMAL HIGH (ref 10.4–12.5)

## 2015-09-27 NOTE — Progress Notes (Signed)
Patient ID: DAESHAWN EICHHOLZ MRN: [id number], DOB: 1950-09-03, 65 y.o. Date of Encounter: 09/27/2015, 12:10 PM    Chief Complaint:  Chief Complaint  Patient presents with  . PT/INR check     HPI: 65 y.o. year old male here for INR check.   He has been taking Coumadin as directed.  No skipped doses. No new medications.  Dietary intake of foods containing Vitamin K stable/steady.   Says he did recently notice area of bruise on right chest. Does not know what caused it. Not aware of any trauma or injury to the area. Says first noticed it ~ 5 days ago. Says it is getting lighter in color/fading.   No bleeding gums, no bleeding.      Home Meds:   Outpatient Prescriptions Prior to Visit  Medication Sig Dispense Refill  . ADVAIR DISKUS 250-50 MCG/DOSE AEPB TAKE 1 INHALATION BY MOUTH TWICE DAILY INTO LUNGS 60 each 4  . amLODipine (NORVASC) 5 MG tablet TAKE ONE TABLET BY MOUTH ONCE DAILY. 30 tablet 7  . amoxicillin-clavulanate (AUGMENTIN) 875-125 MG tablet Take 1 tablet by mouth 2 (two) times daily. 14 tablet 0  . azithromycin (ZITHROMAX) 250 MG tablet Take 2 tablets x 1 day, then 1 tab daily for 4 days 6 tablet 0  . buPROPion (WELLBUTRIN SR) 150 MG 12 hr tablet TAKE 1 TABLET BY MOUTH TWICE DAILY. 60 tablet 5  . celecoxib (CELEBREX) 200 MG capsule TAKE 1 CAPSULE BY MOUTH DAILY 30 capsule 5  . clopidogrel (PLAVIX) 75 MG tablet Take 1 tablet (75 mg total) by mouth daily. 90 tablet 3  . digoxin (LANOXIN) 0.125 MG tablet Take 1 tablet (0.125 mg total) by mouth daily. 30 tablet 11  . escitalopram (LEXAPRO) 10 MG tablet TAKE 1 TABLET BY MOUTH ONCE DAILY 30 tablet 5  . fish oil-omega-3 fatty acids 1000 MG capsule Take 1 g by mouth daily.      . furosemide (LASIX) 20 MG tablet TAKE ONE TABLET BY MOUTH DAILY. 30 tablet 5  . glucose blood test strip 1 each by Other route daily. Check Blood Sugar fasting each morning 100 each 3  . isosorbide mononitrate (IMDUR) 30 MG 24 hr tablet TAKE 1 TABLET BY  MOUTH ONCE DAILY 90 tablet 1  . lisinopril (PRINIVIL,ZESTRIL) 20 MG tablet TAKE ONE TABLET BY MOUTH ONCE DAILY. 30 tablet 5  . metFORMIN (GLUCOPHAGE) 500 MG tablet TAKE 1 TABLET BY MOUTH TWICE DAILY WITH A MEAL 60 tablet 4  . metoprolol succinate (TOPROL-XL) 100 MG 24 hr tablet Take 1 tablet (100 mg total) by mouth 2 (two) times daily. Take with or immediately following a meal.  Take with 50 mg tablet to equal 150 mg dose. 180 tablet 1  . metoprolol succinate (TOPROL-XL) 50 MG 24 hr tablet Take 50 mg by mouth 2 (two) times daily. Take with or immediately following a meal. Take with 100 mg tablet to equal 150 mg dose.    . nitroGLYCERIN (NITROSTAT) 0.4 MG SL tablet Place 1 tablet (0.4 mg total) under the tongue every 5 (five) minutes as needed for chest pain. 25 tablet 1  . pantoprazole (PROTONIX) 40 MG tablet TAKE 1 TABLET BY MOUTH ONCE DAILY 90 tablet 1  . promethazine (PHENERGAN) 25 MG tablet Take 1 tablet (25 mg total) by mouth every 8 (eight) hours as needed for nausea or vomiting. 20 tablet 0  . rosuvastatin (CRESTOR) 20 MG tablet TAKE ONE TABLET BY MOUTH BEFORE BEDTIME. 30 tablet 5  .  warfarin (COUMADIN) 5 MG tablet TAKE 1 TABLET BY MOUTH ON MONDAYS AND 1/2 TABLET ALL OTHER DAYS 30 tablet 5  . zolpidem (AMBIEN) 10 MG tablet Take 10 mg by mouth at bedtime as needed. Reported on 08/25/2015  1   No facility-administered medications prior to visit.    Allergies: No Known Allergies    Review of Systems: See HPI for pertinent ROS. All other ROS negative.    Physical Exam: Blood pressure 124/80, pulse 80, temperature 97.4 F (36.3 C), temperature source Oral, resp. rate 18, weight 229 lb (103.874 kg)., Body mass index is 31.05 kg/(m^2). General:  WNWD WM. Appears in no acute distress. Neck: Supple. No thyromegaly. No lymphadenopathy. Lungs: Clear bilaterally to auscultation without wheezes, rales, or rhonchi. Breathing is unlabored. Heart: Irregular rhythm.  Msk:  Strength and tone normal  for age. Skin: Right Anterior Chest: Approx 1.5 inch diameter area of light purplish ecchymosis.  There is ~ 1cm diameter area of firmness at upper portion of area of ecchymosis. Feels like a cyst. Or could be collection of blood/ hematoma.  Right upper abdomen--There are several small (~1cm diameter) areas of yellowish ecchymosis.  Forearms with multiple scattered areas of deep purple ecchymosis (each ~ 1 cm diameter) Neuro: Alert and oriented X 3. Moves all extremities spontaneously. Gait is normal. CNII-XII grossly in tact. Psych:  Responds to questions appropriately with a normal affect.   Results for orders placed or performed in visit on 09/27/15  PT with INR/Fingerstick  Result Value Ref Range   PT, fingerstick 30.3 (H) 10.4 - 12.5 seconds   INR, fingerstick 2.5 (H) 0.80 - 1.20     ASSESSMENT AND PLAN:  65 y.o. year old male with  1. Long term current use of anticoagulant therapy INR therapeutic. Cont current dose. Recheck 4 weeks.  - PT with INR/Fingerstick  Pt to monitor Right Chest. If ecchymosis increases or does not cont to fade away, f/u.  If firm area increases in size, f/u.  Suspect this is a cyst or possibly small collection of blood under skin.   OV 4 weeks---will be due for ROV to f/u Diabetes, etc as well--at that visit in 1 month.    [address] [place], [place], [facility] 09/27/2015 12:10 PM

## 2015-09-29 ENCOUNTER — Ambulatory Visit: Payer: Commercial Managed Care - HMO | Admitting: Physician Assistant

## 2015-10-13 ENCOUNTER — Other Ambulatory Visit: Payer: Self-pay | Admitting: Physician Assistant

## 2015-10-13 NOTE — Telephone Encounter (Signed)
Medication refilled per protocol. 

## 2015-10-27 ENCOUNTER — Ambulatory Visit (INDEPENDENT_AMBULATORY_CARE_PROVIDER_SITE_OTHER): Payer: Commercial Managed Care - HMO | Admitting: Physician Assistant

## 2015-10-27 ENCOUNTER — Encounter: Payer: Self-pay | Admitting: Physician Assistant

## 2015-10-27 VITALS — BP 124/82 | HR 76 | Temp 97.8°F | Resp 18 | Ht 72.0 in | Wt 232.0 lb

## 2015-10-27 DIAGNOSIS — I2510 Atherosclerotic heart disease of native coronary artery without angina pectoris: Secondary | ICD-10-CM

## 2015-10-27 DIAGNOSIS — Z1159 Encounter for screening for other viral diseases: Secondary | ICD-10-CM | POA: Diagnosis not present

## 2015-10-27 DIAGNOSIS — F329 Major depressive disorder, single episode, unspecified: Secondary | ICD-10-CM

## 2015-10-27 DIAGNOSIS — I4892 Unspecified atrial flutter: Secondary | ICD-10-CM | POA: Diagnosis not present

## 2015-10-27 DIAGNOSIS — R945 Abnormal results of liver function studies: Secondary | ICD-10-CM

## 2015-10-27 DIAGNOSIS — N182 Chronic kidney disease, stage 2 (mild): Secondary | ICD-10-CM | POA: Diagnosis not present

## 2015-10-27 DIAGNOSIS — Z7901 Long term (current) use of anticoagulants: Secondary | ICD-10-CM

## 2015-10-27 DIAGNOSIS — E785 Hyperlipidemia, unspecified: Secondary | ICD-10-CM | POA: Diagnosis not present

## 2015-10-27 DIAGNOSIS — J438 Other emphysema: Secondary | ICD-10-CM

## 2015-10-27 DIAGNOSIS — I2583 Coronary atherosclerosis due to lipid rich plaque: Secondary | ICD-10-CM

## 2015-10-27 DIAGNOSIS — Z0000 Encounter for general adult medical examination without abnormal findings: Secondary | ICD-10-CM | POA: Diagnosis not present

## 2015-10-27 DIAGNOSIS — R972 Elevated prostate specific antigen [PSA]: Secondary | ICD-10-CM

## 2015-10-27 DIAGNOSIS — E559 Vitamin D deficiency, unspecified: Secondary | ICD-10-CM | POA: Diagnosis not present

## 2015-10-27 DIAGNOSIS — I10 Essential (primary) hypertension: Secondary | ICD-10-CM | POA: Diagnosis not present

## 2015-10-27 DIAGNOSIS — I481 Persistent atrial fibrillation: Secondary | ICD-10-CM | POA: Diagnosis not present

## 2015-10-27 DIAGNOSIS — R7989 Other specified abnormal findings of blood chemistry: Secondary | ICD-10-CM | POA: Diagnosis not present

## 2015-10-27 DIAGNOSIS — Z720 Tobacco use: Secondary | ICD-10-CM

## 2015-10-27 DIAGNOSIS — Z125 Encounter for screening for malignant neoplasm of prostate: Secondary | ICD-10-CM

## 2015-10-27 DIAGNOSIS — E119 Type 2 diabetes mellitus without complications: Secondary | ICD-10-CM

## 2015-10-27 DIAGNOSIS — E1142 Type 2 diabetes mellitus with diabetic polyneuropathy: Secondary | ICD-10-CM

## 2015-10-27 DIAGNOSIS — I4819 Other persistent atrial fibrillation: Secondary | ICD-10-CM

## 2015-10-27 DIAGNOSIS — G4700 Insomnia, unspecified: Secondary | ICD-10-CM

## 2015-10-27 DIAGNOSIS — F32A Depression, unspecified: Secondary | ICD-10-CM

## 2015-10-27 LAB — COMPLETE METABOLIC PANEL WITH GFR
ALT: 18 U/L (ref 9–46)
AST: 19 U/L (ref 10–35)
Albumin: 4 g/dL (ref 3.6–5.1)
Alkaline Phosphatase: 63 U/L (ref 40–115)
BUN: 19 mg/dL (ref 7–25)
CO2: 25 mmol/L (ref 20–31)
Calcium: 8.8 mg/dL (ref 8.6–10.3)
Chloride: 105 mmol/L (ref 98–110)
Creat: 1.13 mg/dL (ref 0.70–1.25)
GFR, Est African American: 80 mL/min (ref >=60)
GFR, Est Non African American: 69 mL/min (ref >=60)
Glucose, Bld: 130 mg/dL — ABNORMAL HIGH (ref 70–99)
Potassium: 4.7 mmol/L (ref 3.5–5.3)
Sodium: 139 mmol/L (ref 135–146)
Total Bilirubin: 0.4 mg/dL (ref 0.2–1.2)
Total Protein: 5.8 g/dL — ABNORMAL LOW (ref 6.1–8.1)

## 2015-10-27 LAB — CBC WITH DIFFERENTIAL/PLATELET
Basophils Absolute: 48 cells/uL (ref 0–200)
Basophils Relative: 1 %
Eosinophils Absolute: 240 cells/uL (ref 15–500)
Eosinophils Relative: 5 %
HCT: 40 % (ref 38.5–50.0)
Hemoglobin: 13.2 g/dL (ref 13.0–17.0)
Lymphocytes Relative: 39 %
Lymphs Abs: 1872 cells/uL (ref 850–3900)
MCH: 30.9 pg (ref 27.0–33.0)
MCHC: 33 g/dL (ref 32.0–36.0)
MCV: 93.7 fL (ref 80.0–100.0)
MPV: 11.6 fL (ref 7.5–12.5)
Monocytes Absolute: 384 cells/uL (ref 200–950)
Monocytes Relative: 8 %
Neutro Abs: 2256 cells/uL (ref 1500–7800)
Neutrophils Relative %: 47 %
Platelets: 132 10*3/uL — ABNORMAL LOW (ref 140–400)
RBC: 4.27 MIL/uL (ref 4.20–5.80)
RDW: 14.6 % (ref 11.0–15.0)
WBC: 4.8 10*3/uL (ref 3.8–10.8)

## 2015-10-27 LAB — LIPID PANEL
Cholesterol: 125 mg/dL (ref 125–200)
HDL: 39 mg/dL — ABNORMAL LOW (ref >=40)
LDL Cholesterol: 47 mg/dL (ref <130)
Total CHOL/HDL Ratio: 3.2 Ratio (ref <=5.0)
Triglycerides: 197 mg/dL — ABNORMAL HIGH (ref <150)
VLDL: 39 mg/dL — ABNORMAL HIGH (ref <30)

## 2015-10-27 LAB — PT WITH INR/FINGERSTICK
INR, fingerstick: 2.7 — ABNORMAL HIGH (ref 0.80–1.20)
PT, fingerstick: 32.1 seconds — ABNORMAL HIGH (ref 10.4–12.5)

## 2015-10-27 MED ORDER — GABAPENTIN 300 MG PO CAPS: 300.0000 mg | ORAL_CAPSULE | Freq: Three times a day (TID) | ORAL | Status: DC

## 2015-10-27 NOTE — Progress Notes (Signed)
D   Patient ID: [name] Cook MRN: [id number], DOB: 12-Apr-1951, 65 y.o. Date of Encounter: @DATE @  Chief Complaint:  Chief Complaint  Patient presents with  . Annual Exam    and PT/INR, Hep C screen    HPI: 65 y.o. year old white male  presents for Medicare Annual Wellness Exam as well as Routine followup office visit to f/u Diabetes, etc. Also Here to check his PT/INR.    He had his first office visit with me on 06/19/2013.   He had been seen in our office in the past by [name].  ----First visit with him was 01/05/2011.  ----He only saw him one other visit and that  was 04/19/2011.  Since then, patient has been seeing [facility].  Had [facility].  He had been having his PT INR checked at [facility].  However he wanted to start having it checked at our office.  He presented here on 06/19/13 for PT/INR check and also to re- establish primary care here.  COUMADIN: He is on Coumadin for history of atrial fibrillation/atrial flutter. Goal INR is 2-3. For a long time, his INR was therapeutic taking 2.5 mg on all days except for 5 mg on Mondays /  Wednesdays /  Fridays. However, recently, he has been having elevated INR and dose adjusted.  Now taking 5mg  on Mondays & Fridays. 2.5mg  all other days.  No skipped doses.  No bleeding.  No significant change in dietary intake of Vitamin K containing foods.  No change in medications.   DIABETES: My lab result note attached to labs 10/01/14. That result note stated to stop the metformin. Also stated to decrease Januvia from 100 mg to 50 mg. However, at f/u OV patient stated that he was taking metformin just one in the morning only. Said that he was off of Januvia. Said that the metformin was the only medicine he was on for diabetes.. He has not been checking his blood sugar much recently.  At [place] 04/13/14--he said he was having diarrhea for months. No abdominal  pain--just diarrhea.  IDecreased Metformin--At OV 06/22/14 pt says diarrhea resolved since decreasing the dose of Metformin.  At [place] 10/27/15 he reports tingling sensation across his toes on bilateral feet. Asked if there is any medicine he can use for this pain/tingling sensation. --- Prescribed Neurontin 300 mg 1 by mouth 3 times a day at that visit.  HYPERTENSION: He is taking blood pressure medications as directed. No lightheadedness. No lower extremity edema.  HYPERLIPIDEMIA: He is taking his Crestor as directed. No myalgias or other adverse effects.  CAD: He presented to me with an office visit 10/14/14 with complaints consistent with angina. Subsequently was admitted to the hospital and underwent PTCA. As well he had a prior history of CAD and stenting in the past. This is currently stable with no angina symptoms.  DEPRESSION: At [place] 03/09/2014 he says that he did want to discuss one thing. Says that he has been on medications for depression in the past but then he was without insurance for a while so he had gone off of those medications. He is feeling that he does need to get back on medication for this.  Says that he does not actually have episodes of crying but says that he does feel depressed and feels like he is not really interested in doing things that he used to find pleasure in. He says that  there are no unusual stressors going on in his life right now says that things are same as usual but he just doesn't feel right. Feels depressed as above. Also says that it seems to be affecting his sleep and that he will sometimes wake up in the night thinking of things. Says that [name] had him on a medication that he prescribed here but says that he doesn't think that worked very well but cannot remember the name of it. I reviewed his chart and it looks like that was Lexapro. Patient says that he also knows in the past he has been on Wellbutrin at one point and was on Prozac at one point. He  says that he remembers that the Wellbutrin worked well for him. At that OV I Rxed Wellbutrin. AT OV 04/13/14-- Pt says he is taking this as directed---now taking BID. He cant tell whether it is helping much yet or not.   At [place] 09/02/2014--he said that he thought he needed to get on Lexapro that we have discussed in the past. He said he was taking the Wellbutrin but it did not seem to be controlling his depression symptoms. Michela Pitcher that recently he had not been sleeping very well. Was waking up at about 2 or 3 AM and had hard time getting back to sleep. General did not seem very interested in doing things. Decreased motivation. Decreased pleasure in things that he used to have pleasure with. At that visit he was interested in adding Lexapro to Wellbutrin. Added Lexapro 10 mg daily.  10/01/2014--Today he states that he has noticed no difference since he added the Lexapro. Says that in the past he did feel that the Wellbutrin was helping and feels that he should continue this but says he is not feeling any additional improvement with adding the Lexapro. He feels that he has a "negative attitude all the time "even when there is nothing to be negative about. Says that he is still having problems with insomnia. Says "last night and went to bed at 10 PM woke up at 2 AM. Rate from 2 AM to 3:30. It did go back to sleep around 3:30 but woke at 6 AM. At that visit weaned off of the Lexapro.  He is now on Wellbutrin and this is stable. Reviewed this at [place] 10/2015 and he states that his depression is well controlled and stable with the Wellbutrin at current dose.  INSOMNIA: He is now taking Ambien 10 mg daily at bedtime and this is working well for his insomnia.   At last 2-3 OVs, I reviewed that the last PSA was elevated and that I referred to urology but he had deferred secondary to cost.  Finally,  at last visit we did get him scheduled with urology and discussed cost with him. At [place] with me 03/22/15--he reported to  me that he had his appointment with them the following day-- December 6.  ADDENDUM: Received note from Urology dated 03/23/2015:  Their diagnosis includes: Benign localized hyperplasia of prostate with urinary obstruction Intermittent urinary stream Weak urinary stream Nocturia  Their plan includes: Complex uroflowmetry Cystoscopy Prostate ultrasound PSA reflex to free PVR ultrasound  Discussed this with pt at OV 10/27/2015.  He states that he just went for that one visit at urology and has not been able to follow-up with them since. Says that different things keep popping up and happening. Says it seems that every month something comes up. Says that he just had to buy  a new battery for his vehicle and that was expensive. Says that he has very little money available after he pays his bills. Says that even to go to the urology appointment he had to pay $45 co-pay. Asked if he ever found out what his cost would be for the procedures and he says he never even found out that information and does not know.    Past Medical History  Diagnosis Date  . Persistent atrial fibrillation (HCC)     recurrent atrial flutter since 2001 s/p DCCVs, multiple failed AADs, h/o tachy-mediated cardiomyopathy  . Hypertension     with hypertensive heart disease  . Hyperlipidemia   . Atrial flutter ([place])     radiofrequency ablation in 2001  . Obesity   . Chronic anticoagulation     chronic Coumadin anticoagulation  . Tobacco abuse   . Chronic obstructive pulmonary disease ([place]) 04/20/2011  . GERD (gastroesophageal reflux disease)   . CAD (coronary artery disease)     a. Nonobstructive. Cardiac cath in 2001-50% mid RI, normal LM, LAD, RCA b. cath 10/16/2014 95% mid RCA treated with DES, 99% ost D1 medical management due to small aneurysmal segment  . Shortness of breath     "can come on at any time" (03/14/2013)  . Sleep apnea     "dx'd; couldn't wear the mask" (03/14/2013)  . Diabetes mellitus, type 2  ([place])   . Arthritis     "knees and lower back" (03/14/2013)     Home Meds:  Outpatient Prescriptions Prior to Visit  Medication Sig Dispense Refill  . ADVAIR DISKUS 250-50 MCG/DOSE AEPB TAKE 1 INHALATION BY MOUTH TWICE DAILY INTO LUNGS 60 each 4  . amLODipine (NORVASC) 5 MG tablet TAKE ONE TABLET BY MOUTH ONCE DAILY. 30 tablet 7  . amoxicillin-clavulanate (AUGMENTIN) 875-125 MG tablet Take 1 tablet by mouth 2 (two) times daily. 14 tablet 0  . azithromycin (ZITHROMAX) 250 MG tablet Take 2 tablets x 1 day, then 1 tab daily for 4 days 6 tablet 0  . buPROPion (WELLBUTRIN SR) 150 MG 12 hr tablet TAKE 1 TABLET BY MOUTH TWICE DAILY 60 tablet 5  . celecoxib (CELEBREX) 200 MG capsule TAKE 1 CAPSULE BY MOUTH DAILY 30 capsule 5  . clopidogrel (PLAVIX) 75 MG tablet Take 1 tablet (75 mg total) by mouth daily. 90 tablet 3  . digoxin (LANOXIN) 0.125 MG tablet Take 1 tablet (0.125 mg total) by mouth daily. 30 tablet 11  . escitalopram (LEXAPRO) 10 MG tablet TAKE 1 TABLET BY MOUTH ONCE DAILY 30 tablet 5  . fish oil-omega-3 fatty acids 1000 MG capsule Take 1 g by mouth daily.      . furosemide (LASIX) 20 MG tablet TAKE 1 TABLET BY MOUTH DAILY 30 tablet 5  . glucose blood test strip 1 each by Other route daily. Check Blood Sugar fasting each morning 100 each 3  . isosorbide mononitrate (IMDUR) 30 MG 24 hr tablet TAKE 1 TABLET BY MOUTH ONCE DAILY 90 tablet 1  . lisinopril (PRINIVIL,ZESTRIL) 20 MG tablet TAKE 1 TABLET BY MOUTH ONCE DAILY 30 tablet 5  . metFORMIN (GLUCOPHAGE) 500 MG tablet TAKE 1 TABLET BY MOUTH TWICE DAILY WITH A MEAL 60 tablet 4  . metoprolol succinate (TOPROL-XL) 100 MG 24 hr tablet Take 1 tablet (100 mg total) by mouth 2 (two) times daily. Take with or immediately following a meal.  Take with 50 mg tablet to equal 150 mg dose. 180 tablet 1  . metoprolol succinate (TOPROL-XL)  50 MG 24 hr tablet Take 50 mg by mouth 2 (two) times daily. Take with or immediately following a meal. Take with 100 mg  tablet to equal 150 mg dose.    . nitroGLYCERIN (NITROSTAT) 0.4 MG SL tablet Place 1 tablet (0.4 mg total) under the tongue every 5 (five) minutes as needed for chest pain. 25 tablet 1  . pantoprazole (PROTONIX) 40 MG tablet TAKE 1 TABLET BY MOUTH ONCE DAILY 90 tablet 1  . promethazine (PHENERGAN) 25 MG tablet Take 1 tablet (25 mg total) by mouth every 8 (eight) hours as needed for nausea or vomiting. 20 tablet 0  . rosuvastatin (CRESTOR) 20 MG tablet TAKE 1 TABLET BY MOUTH BEFORE BEDTIME 30 tablet 5  . warfarin (COUMADIN) 5 MG tablet TAKE 1 TABLET BY MOUTH ON MONDAYS AND 1/2 TABLET ALL OTHER DAYS 30 tablet 5  . zolpidem (AMBIEN) 10 MG tablet Take 10 mg by mouth at bedtime as needed. Reported on 08/25/2015  1   No facility-administered medications prior to visit.     Allergies: No Known Allergies  Social History   Social History  . Marital Status: Legally Separated    Spouse Name: N/A  . Number of Children: 1  . Years of Education: N/A   Occupational History  . Unemployed   .     Social History Main Topics  . Smoking status: Former Smoker -- 1.00 packs/day for 42 years    Types: Cigarettes    Quit date: 12/31/2011  . Smokeless tobacco: Never Used  . Alcohol Use: Yes     Comment: 03/14/2013 "stopped drinking back in 2002; never had problem w/it"  . Drug Use: No  . Sexual Activity: Not Currently   Other Topics Concern  . 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          Family History  Problem Relation Age of Onset  . Alzheimer's disease Mother   . Osteoporosis Mother      Review of Systems:  See HPI for pertinent ROS. All other ROS negative.    Physical Exam: Blood pressure 124/82, pulse 76, temperature 97.8 F (36.6 C), temperature source Oral, resp. rate 18, height 6' (1.829 m), weight 232  lb (105.235 kg)., Body mass index is 31.46 kg/(m^2). General: WNWD WM. Appears in no acute distress. Neck: Supple. No thyromegaly. No lymphadenopathy. No carotid bruits. Lungs: Clear bilaterally to auscultation without wheezes, rales, or rhonchi. Breathing is unlabored. Heart: Irreg rhythm. No murmur. Abdomen: Soft, non-tender, non-distended with normoactive bowel sounds. No hepatomegaly. No rebound/guarding. No obvious abdominal masses. Musculoskeletal:  Strength and tone normal for age. Extremities/Skin: Warm and dry. No clubbing or cyanosis. No edema. No rashes or suspicious lesions. Neuro: Alert and oriented X 3. Moves all extremities spontaneously. Gait is normal. CNII-XII grossly in tact. Psych:  Responds to questions appropriately with a normal affect. Diabetic foot exam: Inspection is normal.  Sensation is intact and normal.  He has no palpable posterior tibial pulses bilaterally. 1+ Left dorsalis pedis pulses. No palpable DP pulse on the Right.       ASSESSMENT AND PLAN:  66 y.o. year old male with   FINANCIAL DISTRESS---SEE # 12 AND # 13 BELOW. ---HAS NOT BEEN ABLE TO AFFORD COPAY TO SEE GI OR UROLOGY.  FINALLY, IS  GOING TO SEE UROLOGY---Appt 03/23/2015                                      ----ALSO, SEE HPI---PRIOR TO COMING TO THIS OFFICE, WENT TO [facility] CLINIC AND HEALTH DEPARTMENT    -----WILL HAVE OUR STAFF SEE IF THN OR SOME OTHER ASSISTANCE CAN HELP HIM  He has Medicare secondary to Disability--cannot use savings cards with Medicare.     ADDENDUM: Received note from Urology dated 03/23/2015:  Their diagnosis includes: Benign localized hyperplasia of prostate with urinary obstruction Intermittent urinary stream Weak urinary stream Nocturia  Their plan includes: Complex uroflowmetry Cystoscopy Prostate ultrasound PSA reflex to free PVR ultrasound  See HPI from [place] 10/27/2015 regarding update regarding this issue.   Coronary Artery Disease He underwent cardiac  catheterization 03/17/2013. Was not favorable for PCI. He has had medical treatment of CAD. 10/14/14 he was admitted to the hospital with angina. He subsequently underwent PCI. This is managed by cardiology.   Chronic anticoagulation---PT/INR today therapeutic.  Continue current Coumadin dose. Recheck PT/INR in 4 weeks.  Atrial fibrillation           This is managed by Cardiology and also EP-- [name]  Atrial flutter            This is managed by Cardiology and also EP-- [name] Pacemaker             This is managed by Cardiology and also EP-- [name]  Depression This is stable and controlled. Continue Wellbutrin.  Insomnia This is well controlled with Ambien 10 mg.  Diabetes mellitus, type 2  03/22/2015 OV: I reviewed my lab result note attached to labs 10/01/14. That result note stated to stop the metformin. Also stated to decrease Januvia from 100 mg to 50 mg. However, today patient states that he is taking metformin just one in the morning only. Says that he is now off of Januvia. Says that the metformin is the only medicine he is currently on for diabetes.. Also, current med list shows Metformin as the only Diabetes Medication at present time.   - Microalbumin, urine---done 06/26/2013, 10/01/2014, 10/27/2015 Check A1C today  He is on ACE inhibitor. He is on statin therapy. Since he recent PCI, Per Cardiology, he is on ASA 81mg ,  Plavix and Coumadin per Cardiology.   Foot exam is stable. Patient states that he had an eye exam about one year ago. Is due to schedule followup and patient plans to schedule this. He was told that exam showed "no diabetic damage " At [place] 10/01/2014 he states that his eye exam is due "right now ". I wrote on his AVS to remind him to call to schedule follow-up eye exam. At [place] 03/22/15 he states that he still has not seen ophthalmologist. He is aware that he needs to do so.  He received Pneumovax 23 here 01/05/2011 No further pneumonia vaccine  indicated until age 59  Diabetic Neuropathy At OV 10/27/2015---Added Neurontin 300mg  1 po TID  Hypertension At goal. On ACE inhibitor.  Check labs to monitor.  Hyperlipidemia At last lab -- LFTs normal.Last Lipid Panel--10/01/2014--Excellent--triglyceride 86. HDL 47. LDL 70. Lipid Panel is excellent. Continued Crestor 20 mg. He is not fasting today so will wait to recheck FLP. Will check LFTs.  Chronic obstructive pulmonary disease Quit smoking 2013. He is on Advair. His breathing is stable on current medications.  GASTROESOPHAGEAL REFLUX DISEASE Protonix can be used with Plavix to continue this one.  Prostate cancer screening -- Elevated PSA -PSA was elevated 02/05/2014.--4.91.  I ordered referral to urology. Patient reported that he could not afford the co-pay and did not follow-up with urology.  PSA repeated 06/22/2014---4.71---I had noted for staff to see if  can find any type of assistance program to help cover cost so that he can follow-up with urology.However, this has not happened.  AT OV 10/01/2014--I am discussing with staff again to investigate options for financial assistance/other services, options to help him. AT [place] 03/22/2015--- he states that he has his appointment with urology tomorrow and is planning to follow-up and go to that appointment.   ADDENDUM: Received note from Urology dated 03/23/2015:  Their diagnosis includes: Benign localized hyperplasia of prostate with urinary obstruction Intermittent urinary stream Weak urinary stream Nocturia  Their plan includes: Complex uroflowmetry Cystoscopy Prostate ultrasound PSA reflex to free PVR ultrasound  See HPI from [place] 10/27/2015 regarding update regarding this issue.   Screening for colorectal cancer The following is copied form his OV on 06/26/2013:   "He reports that he has never had a colonoscopy. Says in the past he was going to -but that's when he then lost his insurance. He is agreeable to proceed with this  and is agreeable for me to do her referral for followup with GI. - Ambulatory referral to Gastroenterology "  At f/u OV 11/06/2013 he reports: He cannot even afford the co-pay to see GI for the office visit. His co-pay for that visit was going to be $45. He does not even know what his cost would be for the colonoscopy itself. OV 10/2015 he still reports the same. Says that he has not followed up with GI.   Immunizations: He received Pneumovax 23 here in the office on 01/05/2011 No further pneumonia vaccine indicated until age 25. He received tetanus here at our office 01/05/2011 Influenza Vaccine----Given here 04/13/2014, 01/04/2015 Zostavax: So far, have not even discussed, as cost will likely be prohibitive      Subjective:   Patient presents for Medicare Annual/Subsequent preventive examination.   Review Past Medical/Family/Social:All of this information is reviewed today.   Risk Factors  Current exercise habits: He does work around the house but no other formal exercise. Dietary issues discussed: Follows a diabetic and low-cholesterol diet.  Cardiac risk factors: He has known CAD and sees a cardiologist routinely.  Depression Screen  (Note: if answer to either of the following is "Yes", a more complete depression screening is indicated)  he is on medication for depression and his depression is controlled with this medication. Over the past two weeks, have you felt down, depressed or hopeless? No Over the past two weeks, have you felt little interest or pleasure in doing things? No Have you lost interest or pleasure in daily life? No Do you often feel hopeless? No Do you cry easily over simple problems? No   Activities of Daily Living  In your present state of health, do you have any difficulty performing the following activities?:  Driving? No  Managing money? No  Feeding yourself? No  Getting from bed to chair? No  Climbing a flight of stairs? No  Preparing food and  eating?: No  Bathing or showering? No  Getting dressed: No  Getting to the toilet? No  Using the toilet:No  Moving around from place to place: No  In the past year have you fallen or had a  near fall?:No  Are you sexually active? No  Do you have more than one partner? No   Hearing Difficulties: No  Do you often ask people to speak up or repeat themselves? No  Do you experience ringing or noises in your ears? No Do you have difficulty understanding soft or whispered voices? No  Do you feel that you have a problem with memory? No Do you often misplace items? No  Do you feel safe at home? Yes  Cognitive Testing  Alert? Yes Normal Appearance?Yes  Oriented to person? Yes Place? Yes  Time? Yes  Recall of three objects? Yes  Can perform simple calculations? Yes  Displays appropriate judgment?Yes  Can read the correct time from a watch face?Yes   List the Names of Other Physician/Practitioners you currently use:  He sees cardiology routinely. He saw urology for one visit and needs follow-up see above  Indicate any recent Medical Services you may have received from other than [facility] providers in the past year (date may be approximate).   Screening Tests / Date----this information is documented in the note above Colonoscopy                     Zostavax  Mammogram  Influenza Vaccine  Tetanus/tdap    Assessment:    Annual wellness medicare exam   Plan:    During the course of the visit the patient was educated and counseled about appropriate screening and preventive services including:  Screening mammography  Colorectal cancer screening  Shingles vaccine. Prescription given to that she can get the vaccine at the pharmacy or Medicare part D.  Screen + for depression. PHQ- 9 score of 12 (moderate depression). We discussed the options of counseling versus possibly a medication. I encouraged her strongly think about the counseling. She is going through some medical problems currently  and her husband is as well Mrs. been very stressful for her. She says she will think about it. She does have Xanax to use as needed. Though she may benefit from an SSRI for her more depressive type symptoms but she wants to hold off at this time.  I aksed her to please have her cardioloist send records since we have none on file.  Diet review for nutrition referral? Yes ____ Not Indicated __x__  Patient Instructions (the written plan) was given to the patient.  Medicare Attestation  I have personally reviewed:  The patient's medical and social history  Their use of alcohol, tobacco or illicit drugs  Their current medications and supplements  The patient's functional ability including ADLs,fall risks, home safety risks, cognitive, and hearing and visual impairment  Diet and physical activities  Evidence for depression or mood disorders  The patient's weight, height, BMI, and visual acuity have been recorded in the chart. I have made referrals, counseling, and provided education to the patient based on review of the above and I have provided the patient with a written personalized care plan for preventive services.       Follow-up here in 4 weeks for PT/INR. He will follow-up in 3 months for routine follow-up visit.    [address] [place], [place], [facility] 10/27/2015 10:39 AM

## 2015-10-28 LAB — VITAMIN D 25 HYDROXY (VIT D DEFICIENCY, FRACTURES): Vit D, 25-Hydroxy: 32 ng/mL (ref 30–100)

## 2015-10-28 LAB — HEMOGLOBIN A1C
Hgb A1c MFr Bld: 6.6 % — ABNORMAL HIGH (ref <5.7)
Mean Plasma Glucose: 143 mg/dL

## 2015-10-28 LAB — PSA, MEDICARE: PSA: 4.33 ng/mL — ABNORMAL HIGH (ref <=4.00)

## 2015-10-28 LAB — HEPATITIS C ANTIBODY: HCV Ab: NEGATIVE

## 2015-10-28 LAB — TSH: TSH: 1.93 mIU/L (ref 0.40–4.50)

## 2015-11-22 ENCOUNTER — Other Ambulatory Visit: Payer: Self-pay | Admitting: Family Medicine

## 2015-11-22 MED ORDER — BUPROPION HCL ER (SR) 150 MG PO TB12: 150.0000 mg | ORAL_TABLET | Freq: Two times a day (BID) | ORAL | 1 refills | Status: DC

## 2015-11-22 MED ORDER — METFORMIN HCL 500 MG PO TABS: ORAL_TABLET | ORAL | 1 refills | Status: DC

## 2015-11-22 MED ORDER — CELECOXIB 200 MG PO CAPS: 200.0000 mg | ORAL_CAPSULE | Freq: Every day | ORAL | 1 refills | Status: DC

## 2015-11-22 NOTE — Telephone Encounter (Signed)
Medication refilled per protocol. 

## 2015-11-23 ENCOUNTER — Other Ambulatory Visit: Payer: Self-pay | Admitting: *Deleted

## 2015-11-23 MED ORDER — DIGOXIN 125 MCG PO TABS: 0.1250 mg | ORAL_TABLET | Freq: Every day | ORAL | 2 refills | Status: DC

## 2015-11-25 ENCOUNTER — Encounter: Payer: Self-pay | Admitting: Physician Assistant

## 2015-11-25 ENCOUNTER — Ambulatory Visit (INDEPENDENT_AMBULATORY_CARE_PROVIDER_SITE_OTHER): Payer: Commercial Managed Care - HMO | Admitting: Physician Assistant

## 2015-11-25 VITALS — BP 118/68 | HR 78 | Temp 98.4°F | Ht 72.0 in | Wt 235.0 lb

## 2015-11-25 DIAGNOSIS — Z7901 Long term (current) use of anticoagulants: Secondary | ICD-10-CM

## 2015-11-25 LAB — PT WITH INR/FINGERSTICK
INR, fingerstick: 2.8 — ABNORMAL HIGH (ref 0.80–1.20)
PT, fingerstick: 33.2 seconds — ABNORMAL HIGH (ref 10.4–12.5)

## 2015-11-25 NOTE — Progress Notes (Signed)
Patient ID: [name] Cook MRN: [id number], DOB: December 12, 1950, 65 y.o. Date of Encounter: 11/25/2015, 4:58 PM    Chief Complaint:  Chief Complaint  Patient presents with  . Follow-up    PT INR check   . Medication Problem    States Celebrex isnt working as well as it used to      HPI: 65 y.o. year old male here for INR check.   He has been taking Coumadin as directed.  No skipped doses. No new medications.  Dietary intake of foods containing Vitamin K stable/steady.    No bleeding gums, no bleeding.    Home Meds:   Outpatient Medications Prior to Visit  Medication Sig Dispense Refill  . ADVAIR DISKUS 250-50 MCG/DOSE AEPB TAKE 1 INHALATION BY MOUTH TWICE DAILY INTO LUNGS 60 each 4  . amLODipine (NORVASC) 5 MG tablet TAKE ONE TABLET BY MOUTH ONCE DAILY. 30 tablet 7  . amoxicillin-clavulanate (AUGMENTIN) 875-125 MG tablet Take 1 tablet by mouth 2 (two) times daily. 14 tablet 0  . azithromycin (ZITHROMAX) 250 MG tablet Take 2 tablets x 1 day, then 1 tab daily for 4 days 6 tablet 0  . buPROPion (WELLBUTRIN SR) 150 MG 12 hr tablet Take 1 tablet (150 mg total) by mouth 2 (two) times daily. 180 tablet 1  . celecoxib (CELEBREX) 200 MG capsule Take 1 capsule (200 mg total) by mouth daily. 90 capsule 1  . clopidogrel (PLAVIX) 75 MG tablet Take 1 tablet (75 mg total) by mouth daily. 90 tablet 3  . digoxin (LANOXIN) 0.125 MG tablet Take 1 tablet (0.125 mg total) by mouth daily. 90 tablet 2  . escitalopram (LEXAPRO) 10 MG tablet TAKE 1 TABLET BY MOUTH ONCE DAILY 30 tablet 5  . fish oil-omega-3 fatty acids 1000 MG capsule Take 1 g by mouth daily.      . furosemide (LASIX) 20 MG tablet TAKE 1 TABLET BY MOUTH DAILY 30 tablet 5  . gabapentin (NEURONTIN) 300 MG capsule Take 1 capsule (300 mg total) by mouth 3 (three) times daily. 90 capsule 3  . glucose blood test strip 1 each by Other route daily. Check Blood Sugar fasting each morning 100 each 3  . isosorbide mononitrate (IMDUR) 30 MG 24 hr  tablet TAKE 1 TABLET BY MOUTH ONCE DAILY 90 tablet 1  . lisinopril (PRINIVIL,ZESTRIL) 20 MG tablet TAKE 1 TABLET BY MOUTH ONCE DAILY 30 tablet 5  . metFORMIN (GLUCOPHAGE) 500 MG tablet TAKE 1 TABLET BY MOUTH TWICE DAILY WITH A MEAL 180 tablet 1  . metoprolol succinate (TOPROL-XL) 100 MG 24 hr tablet Take 1 tablet (100 mg total) by mouth 2 (two) times daily. Take with or immediately following a meal.  Take with 50 mg tablet to equal 150 mg dose. 180 tablet 1  . metoprolol succinate (TOPROL-XL) 50 MG 24 hr tablet Take 50 mg by mouth 2 (two) times daily. Take with or immediately following a meal. Take with 100 mg tablet to equal 150 mg dose.    . nitroGLYCERIN (NITROSTAT) 0.4 MG SL tablet Place 1 tablet (0.4 mg total) under the tongue every 5 (five) minutes as needed for chest pain. 25 tablet 1  . pantoprazole (PROTONIX) 40 MG tablet TAKE 1 TABLET BY MOUTH ONCE DAILY 90 tablet 1  . promethazine (PHENERGAN) 25 MG tablet Take 1 tablet (25 mg total) by mouth every 8 (eight) hours as needed for nausea or vomiting. 20 tablet 0  . rosuvastatin (CRESTOR) 20 MG tablet TAKE 1  TABLET BY MOUTH BEFORE BEDTIME 30 tablet 5  . warfarin (COUMADIN) 5 MG tablet TAKE 1 TABLET BY MOUTH ON MONDAYS AND 1/2 TABLET ALL OTHER DAYS 30 tablet 5  . zolpidem (AMBIEN) 10 MG tablet Take 10 mg by mouth at bedtime as needed. Reported on 08/25/2015  1   No facility-administered medications prior to visit.     Allergies: No Known Allergies    Review of Systems: See HPI for pertinent ROS. All other ROS negative.    Physical Exam: Blood pressure 118/68, pulse 78, temperature 98.4 F (36.9 C), temperature source Oral, height 6' (1.829 m), weight 235 lb (106.6 kg), SpO2 98 %., Body mass index is 31.87 kg/m. General:  WNWD WM. Appears in no acute distress. Neck: Supple. No thyromegaly. No lymphadenopathy. Lungs: Clear bilaterally to auscultation without wheezes, rales, or rhonchi. Breathing is unlabored. Heart: Irregular rhythm.    Msk:  Strength and tone normal for age. Neuro: Alert and oriented X 3. Moves all extremities spontaneously. Gait is normal. CNII-XII grossly in tact. Psych:  Responds to questions appropriately with a normal affect.   Results for orders placed or performed in visit on 11/25/15  PT with INR/Fingerstick  Result Value Ref Range   PT, fingerstick 33.2 (H) 10.4 - 12.5 seconds   INR, fingerstick 2.8 (H) 0.80 - 1.20     ASSESSMENT AND PLAN:  65 y.o. year old male with  1. Long term current use of anticoagulant therapy INR therapeutic. Cont current dose. Recheck 4 weeks.  - PT with INR/Fingerstick   Signed, [place], [place], [facility] 11/25/2015 4:58 PM

## 2015-12-04 ENCOUNTER — Other Ambulatory Visit: Payer: Self-pay | Admitting: Physician Assistant

## 2015-12-06 NOTE — Telephone Encounter (Signed)
Refill appropriate and filled per protocol. 

## 2015-12-13 ENCOUNTER — Other Ambulatory Visit: Payer: Self-pay | Admitting: Physician Assistant

## 2015-12-14 ENCOUNTER — Other Ambulatory Visit: Payer: Self-pay | Admitting: Physician Assistant

## 2015-12-14 NOTE — Telephone Encounter (Signed)
Review for refill. 

## 2015-12-18 ENCOUNTER — Other Ambulatory Visit: Payer: Self-pay | Admitting: Physician Assistant

## 2015-12-21 ENCOUNTER — Other Ambulatory Visit: Payer: Self-pay | Admitting: *Deleted

## 2015-12-21 NOTE — Telephone Encounter (Signed)
Review for refill. 

## 2015-12-23 ENCOUNTER — Ambulatory Visit (INDEPENDENT_AMBULATORY_CARE_PROVIDER_SITE_OTHER): Payer: Commercial Managed Care - HMO | Admitting: Physician Assistant

## 2015-12-23 ENCOUNTER — Encounter: Payer: Self-pay | Admitting: Physician Assistant

## 2015-12-23 VITALS — BP 128/84 | HR 96 | Temp 97.9°F | Resp 18 | Wt 234.0 lb

## 2015-12-23 DIAGNOSIS — M545 Low back pain, unspecified: Secondary | ICD-10-CM

## 2015-12-23 DIAGNOSIS — Z7901 Long term (current) use of anticoagulants: Secondary | ICD-10-CM | POA: Diagnosis not present

## 2015-12-23 LAB — PT WITH INR/FINGERSTICK
INR, fingerstick: 2.7 — ABNORMAL HIGH (ref 0.80–1.20)
PT, fingerstick: 32.5 seconds — ABNORMAL HIGH (ref 10.4–12.5)

## 2015-12-23 MED ORDER — CYCLOBENZAPRINE HCL 10 MG PO TABS: 10.0000 mg | ORAL_TABLET | Freq: Three times a day (TID) | ORAL | 0 refills | Status: DC | PRN

## 2015-12-23 MED ORDER — HYDROCODONE-ACETAMINOPHEN 5-325 MG PO TABS: 1.0000 | ORAL_TABLET | Freq: Four times a day (QID) | ORAL | 0 refills | Status: DC | PRN

## 2015-12-23 NOTE — Progress Notes (Signed)
Patient ID: [name] Cook MRN: [id number], DOB: 1950-11-18, 65 y.o. Date of Encounter: 12/23/2015, 5:08 PM    Chief Complaint:  Chief Complaint  Patient presents with  . Back Pain    started a month ago     HPI: 65 y.o. year old male here for PT/INR check. Also reports some back pain.   He is taking Coumadin as directed. No skipped doses. No bleeding.  He points to midline back at approximate L3 level as area of pain. States that this has been bothering him for about a month. Says that he does not remember doing any lifting or particular activity prior to onset of discomfort. No trauma or injury. Says that he feels the discomfort when he is up moving around. Says that driving here after sitting in his vehicle and then getting out of the car felt stabbing like a knife stabbing pain in the midline at that level. Asked if there was any radiation of the pain or sciatic symptoms. No sciatica but says that he does feel some radiation down the left upper leg. Has had no weakness in either leg or foot. No incontinence of bowel or bladder.  I looked in chart review to see if he has had x-rays or MRIs of his lumbar spine. See a MRI lumbar spine from 2009.  He states that he has had minimal problems with his back since 2009 until now.  Says that he remembers that in 2009 he went to the ER and they gave him muscle relaxer and hydrocodone and it "knocked it out " and had no further problems with it until now.  He notes that now he cannot have an MRI because he has a pacemaker now.  No other complaints or concerns today.    Home Meds:   Outpatient Medications Prior to Visit  Medication Sig Dispense Refill  . ADVAIR DISKUS 250-50 MCG/DOSE AEPB TAKE 1 INHALATION BY MOUTH TWICE DAILY INTO LUNGS 60 each 4  . amLODipine (NORVASC) 5 MG tablet TAKE 1 TABLET BY MOUTH ONCE DAILY 90 tablet 1  . amoxicillin-clavulanate (AUGMENTIN) 875-125 MG tablet Take 1 tablet by mouth 2 (two) times daily. 14 tablet 0   . azithromycin (ZITHROMAX) 250 MG tablet Take 2 tablets x 1 day, then 1 tab daily for 4 days 6 tablet 0  . buPROPion (WELLBUTRIN SR) 150 MG 12 hr tablet Take 1 tablet (150 mg total) by mouth 2 (two) times daily. 180 tablet 1  . celecoxib (CELEBREX) 200 MG capsule Take 1 capsule (200 mg total) by mouth daily. 90 capsule 1  . clopidogrel (PLAVIX) 75 MG tablet TAKE 1 TABLET BY MOUTH DAILY 90 tablet 1  . digoxin (LANOXIN) 0.125 MG tablet Take 1 tablet (0.125 mg total) by mouth daily. 90 tablet 2  . escitalopram (LEXAPRO) 10 MG tablet TAKE 1 TABLET BY MOUTH ONCE DAILY 30 tablet 5  . fish oil-omega-3 fatty acids 1000 MG capsule Take 1 g by mouth daily.      . furosemide (LASIX) 20 MG tablet TAKE 1 TABLET BY MOUTH DAILY 30 tablet 5  . gabapentin (NEURONTIN) 300 MG capsule Take 1 capsule (300 mg total) by mouth 3 (three) times daily. 90 capsule 3  . glucose blood test strip 1 each by Other route daily. Check Blood Sugar fasting each morning 100 each 3  . isosorbide mononitrate (IMDUR) 30 MG 24 hr tablet TAKE 1 TABLET BY MOUTH ONCE DAILY 90 tablet 1  . lisinopril (PRINIVIL,ZESTRIL) 20 MG  tablet TAKE 1 TABLET BY MOUTH ONCE DAILY 30 tablet 5  . metFORMIN (GLUCOPHAGE) 500 MG tablet TAKE 1 TABLET BY MOUTH TWICE DAILY WITH A MEAL 180 tablet 1  . metoprolol succinate (TOPROL-XL) 100 MG 24 hr tablet Take 1 tablet (100 mg total) by mouth 2 (two) times daily. Take with or immediately following a meal.  Take with 50 mg tablet to equal 150 mg dose. 180 tablet 1  . metoprolol succinate (TOPROL-XL) 50 MG 24 hr tablet Take 50 mg by mouth 2 (two) times daily. Take with or immediately following a meal. Take with 100 mg tablet to equal 150 mg dose.    . nitroGLYCERIN (NITROSTAT) 0.4 MG SL tablet Place 1 tablet (0.4 mg total) under the tongue every 5 (five) minutes as needed for chest pain. 25 tablet 1  . pantoprazole (PROTONIX) 40 MG tablet TAKE 1 TABLET BY MOUTH ONCE DAILY 90 tablet 1  . promethazine (PHENERGAN) 25 MG  tablet Take 1 tablet (25 mg total) by mouth every 8 (eight) hours as needed for nausea or vomiting. 20 tablet 0  . rosuvastatin (CRESTOR) 20 MG tablet TAKE 1 TABLET BY MOUTH BEFORE BEDTIME 30 tablet 5  . warfarin (COUMADIN) 5 MG tablet TAKE 1 TABLET BY MOUTH ON MONDAYS AND 1/2 TABLET ALL OTHER DAYS 30 tablet 5  . zolpidem (AMBIEN) 10 MG tablet Take 10 mg by mouth at bedtime as needed. Reported on 08/25/2015  1   No facility-administered medications prior to visit.     Allergies: Not on File    Review of Systems: See HPI for pertinent ROS. All other ROS negative.    Physical Exam: Blood pressure 128/84, pulse 96, temperature 97.9 F (36.6 C), temperature source Oral, resp. rate 18, weight 234 lb (106.1 kg)., Body mass index is 31.74 kg/m. General:  Appears in no acute distress. Neck: Supple. No thyromegaly. No lymphadenopathy. Lungs: Clear bilaterally to auscultation without wheezes, rales, or rhonchi. Breathing is unlabored. Heart: Regular rhythm. No murmurs, rubs, or gallops. Msk:  Strength and tone normal for age. Mild pain with palpation around L3 level of back bilaterally. Extremities/Skin: Warm and dry.  Neuro: Alert and oriented X 3. Moves all extremities spontaneously. Gait is normal. CNII-XII grossly in tact. Psych:  Responds to questions appropriately with a normal affect.   Results for orders placed or performed in visit on 12/23/15  PT with INR/Fingerstick  Result Value Ref Range   PT, fingerstick 32.5 (H) 10.4 - 12.5 seconds   INR, fingerstick 2.7 (H) 0.80 - 1.20     ASSESSMENT AND PLAN:  65 y.o. year old male with  1. Long term current use of anticoagulant therapy INR therapeutic. Continue current Coumadin dosing. Recheck PT/INR 4 weeks. - PT with INR/Fingerstick  2. Midline low back pain without sciatica Cautioned that Flexeril may cause drowsiness and if it does then only take at bedtime. However if this causes no drowsiness then take 3 times daily. Cautioned  him that the hydrocodone may cause him to feel loopy or drowsy and not to take prior to driving either. Recommend he use a heating pad and hot water in the shower to help relax these muscles and help with pain. If symptoms worsen or are not improved in 1-2 weeks then follow-up. Reviewed MRI lumbar spine report which is in epic dated 05/23/2007. This did show some degenerative disc disease. See that full report for details. - cyclobenzaprine (FLEXERIL) 10 MG tablet; Take 1 tablet (10 mg total) by mouth 3 (  three) times daily as needed for muscle spasms.  Dispense: 30 tablet; Refill: 0 - HYDROcodone-acetaminophen (NORCO/VICODIN) 5-325 MG tablet; Take 1 tablet by mouth every 6 (six) hours as needed.  Dispense: 30 tablet; Refill: 0   Signed, [address] [place], [place], [facility] 12/23/2015 5:08 PM

## 2015-12-26 ENCOUNTER — Other Ambulatory Visit: Payer: Self-pay | Admitting: Physician Assistant

## 2016-01-24 ENCOUNTER — Ambulatory Visit: Payer: Commercial Managed Care - HMO | Admitting: Physician Assistant

## 2016-01-26 ENCOUNTER — Encounter: Payer: Self-pay | Admitting: Physician Assistant

## 2016-01-26 ENCOUNTER — Ambulatory Visit (INDEPENDENT_AMBULATORY_CARE_PROVIDER_SITE_OTHER): Payer: Commercial Managed Care - HMO | Admitting: Physician Assistant

## 2016-01-26 VITALS — BP 124/78 | HR 83 | Temp 97.4°F | Resp 18 | Wt 228.0 lb

## 2016-01-26 DIAGNOSIS — Z7901 Long term (current) use of anticoagulants: Secondary | ICD-10-CM

## 2016-01-26 LAB — PT WITH INR/FINGERSTICK
INR, fingerstick: 3.4 — ABNORMAL HIGH (ref 0.80–1.20)
PT, fingerstick: 40.8 seconds — ABNORMAL HIGH (ref 10.4–12.5)

## 2016-01-26 MED ORDER — WARFARIN SODIUM 5 MG PO TABS: ORAL_TABLET | ORAL | 1 refills | Status: DC

## 2016-01-26 NOTE — Progress Notes (Signed)
Patient ID: [name] Cook MRN: [id number], DOB: 11/06/1950, 65 y.o. Date of Encounter: 01/26/2016, 3:24 PM    Chief Complaint:  Chief Complaint  Patient presents with  . Follow-up     HPI: 65 y.o. year old male here for PT/INR check.    He is taking Coumadin as directed. No skipped doses. No bleeding.    No other complaints or concerns today.    Home Meds:   Outpatient Medications Prior to Visit  Medication Sig Dispense Refill  . ADVAIR DISKUS 250-50 MCG/DOSE AEPB TAKE 1 INHALATION BY MOUTH TWICE DAILY INTO LUNGS 60 each 4  . amLODipine (NORVASC) 5 MG tablet TAKE 1 TABLET BY MOUTH ONCE DAILY 90 tablet 1  . amoxicillin-clavulanate (AUGMENTIN) 875-125 MG tablet Take 1 tablet by mouth 2 (two) times daily. 14 tablet 0  . azithromycin (ZITHROMAX) 250 MG tablet Take 2 tablets x 1 day, then 1 tab daily for 4 days 6 tablet 0  . buPROPion (WELLBUTRIN SR) 150 MG 12 hr tablet Take 1 tablet (150 mg total) by mouth 2 (two) times daily. 180 tablet 1  . celecoxib (CELEBREX) 200 MG capsule Take 1 capsule (200 mg total) by mouth daily. 90 capsule 1  . clopidogrel (PLAVIX) 75 MG tablet TAKE 1 TABLET BY MOUTH DAILY 90 tablet 1  . cyclobenzaprine (FLEXERIL) 10 MG tablet Take 1 tablet (10 mg total) by mouth 3 (three) times daily as needed for muscle spasms. 30 tablet 0  . digoxin (LANOXIN) 0.125 MG tablet Take 1 tablet (0.125 mg total) by mouth daily. 90 tablet 2  . escitalopram (LEXAPRO) 10 MG tablet TAKE 1 TABLET BY MOUTH ONCE DAILY 30 tablet 5  . fish oil-omega-3 fatty acids 1000 MG capsule Take 1 g by mouth daily.      . furosemide (LASIX) 20 MG tablet TAKE 1 TABLET BY MOUTH DAILY 30 tablet 5  . gabapentin (NEURONTIN) 300 MG capsule Take 1 capsule (300 mg total) by mouth 3 (three) times daily. 90 capsule 3  . glucose blood test strip 1 each by Other route daily. Check Blood Sugar fasting each morning 100 each 3  . HYDROcodone-acetaminophen (NORCO/VICODIN) 5-325 MG tablet Take 1 tablet by  mouth every 6 (six) hours as needed. 30 tablet 0  . isosorbide mononitrate (IMDUR) 30 MG 24 hr tablet TAKE 1 TABLET BY MOUTH ONCE DAILY 90 tablet 1  . lisinopril (PRINIVIL,ZESTRIL) 20 MG tablet TAKE 1 TABLET BY MOUTH ONCE DAILY 30 tablet 5  . metFORMIN (GLUCOPHAGE) 500 MG tablet TAKE 1 TABLET BY MOUTH TWICE DAILY WITH A MEAL 180 tablet 1  . metoprolol succinate (TOPROL-XL) 100 MG 24 hr tablet TAKE 1 TABLET BY MOUTH TWICE DAILY ALONG WITH 50MG  TAB FOR A TOTAL DAILY DOSE OF 150MG  TWICE DAILY 180 tablet 1  . metoprolol succinate (TOPROL-XL) 50 MG 24 hr tablet TAKE 1 TABLET BY MOUTH TWICE DAILY WITH 100MG  TABLET FOR TOTAL DAILY DOSE OF 150MG  TWICE DAILY 180 tablet 1  . nitroGLYCERIN (NITROSTAT) 0.4 MG SL tablet Place 1 tablet (0.4 mg total) under the tongue every 5 (five) minutes as needed for chest pain. 25 tablet 1  . pantoprazole (PROTONIX) 40 MG tablet TAKE 1 TABLET BY MOUTH ONCE DAILY 90 tablet 1  . promethazine (PHENERGAN) 25 MG tablet Take 1 tablet (25 mg total) by mouth every 8 (eight) hours as needed for nausea or vomiting. 20 tablet 0  . rosuvastatin (CRESTOR) 20 MG tablet TAKE 1 TABLET BY MOUTH BEFORE BEDTIME 30 tablet  5  . zolpidem (AMBIEN) 10 MG tablet Take 10 mg by mouth at bedtime as needed. Reported on 08/25/2015  1  . warfarin (COUMADIN) 5 MG tablet TAKE 1 TABLET BY MOUTH ON MONDAYS AND 1/2 TABLET ALL OTHER DAYS 30 tablet 5   No facility-administered medications prior to visit.     Allergies: Not on File    Review of Systems: See HPI for pertinent ROS. All other ROS negative.    Physical Exam: Blood pressure 124/78, pulse 83, temperature 97.4 F (36.3 C), resp. rate 18, weight 228 lb (103.4 kg), SpO2 97 %., Body mass index is 30.92 kg/m. General:  Appears in no acute distress. Neck: Supple. No thyromegaly. No lymphadenopathy. Lungs: Clear bilaterally to auscultation without wheezes, rales, or rhonchi. Breathing is unlabored. Heart: Regular rhythm. No murmurs, rubs, or  gallops. Msk:  Strength and tone normal for age. Mild pain with palpation around L3 level of back bilaterally. Extremities/Skin: Warm and dry.  Neuro: Alert and oriented X 3. Moves all extremities spontaneously. Gait is normal. CNII-XII grossly in tact. Psych:  Responds to questions appropriately with a normal affect.      ASSESSMENT AND PLAN:  65 y.o. year old male with  1. Long term current use of anticoagulant therapy INR 3.4 Last INR on 01/02/2016 was 2.7. INR today is barely supratherapeutic and last INR was excellent. Therefore we'll not change the dose. Continue current Coumadin dosing. Recheck PT/INR 2 weeks. - PT with INR/Fingerstick  Signed, Olean Re[place], [place], [facility] 01/26/2016 3:24 PM

## 2016-01-27 ENCOUNTER — Encounter: Payer: Self-pay | Admitting: Physician Assistant

## 2016-02-23 ENCOUNTER — Other Ambulatory Visit: Payer: Self-pay

## 2016-02-23 ENCOUNTER — Ambulatory Visit: Payer: Commercial Managed Care - HMO | Admitting: Physician Assistant

## 2016-02-23 MED ORDER — WARFARIN SODIUM 5 MG PO TABS: ORAL_TABLET | ORAL | 1 refills | Status: DC

## 2016-02-23 NOTE — Telephone Encounter (Signed)
Rx refilled per per protocol

## 2016-03-06 ENCOUNTER — Other Ambulatory Visit: Payer: Self-pay | Admitting: Physician Assistant

## 2016-03-06 DIAGNOSIS — E1142 Type 2 diabetes mellitus with diabetic polyneuropathy: Secondary | ICD-10-CM

## 2016-03-06 NOTE — Telephone Encounter (Signed)
Rx filled per protocol  

## 2016-03-10 ENCOUNTER — Emergency Department (HOSPITAL_COMMUNITY): Payer: Commercial Managed Care - HMO

## 2016-03-10 ENCOUNTER — Encounter (HOSPITAL_COMMUNITY): Payer: Self-pay | Admitting: Nurse Practitioner

## 2016-03-10 ENCOUNTER — Emergency Department (HOSPITAL_COMMUNITY)
Admission: EM | Admit: 2016-03-10 | Discharge: 2016-03-11 | Disposition: A | Discharge status: Home / Self Care | Payer: Commercial Managed Care - HMO | Attending: Emergency Medicine | Admitting: Emergency Medicine

## 2016-03-10 DIAGNOSIS — Z87891 Personal history of nicotine dependence: Secondary | ICD-10-CM | POA: Diagnosis not present

## 2016-03-10 DIAGNOSIS — E1122 Type 2 diabetes mellitus with diabetic chronic kidney disease: Secondary | ICD-10-CM | POA: Insufficient documentation

## 2016-03-10 DIAGNOSIS — Z7984 Long term (current) use of oral hypoglycemic drugs: Secondary | ICD-10-CM | POA: Insufficient documentation

## 2016-03-10 DIAGNOSIS — R002 Palpitations: Secondary | ICD-10-CM

## 2016-03-10 DIAGNOSIS — Z950 Presence of cardiac pacemaker: Secondary | ICD-10-CM | POA: Insufficient documentation

## 2016-03-10 DIAGNOSIS — Z7901 Long term (current) use of anticoagulants: Secondary | ICD-10-CM | POA: Diagnosis not present

## 2016-03-10 DIAGNOSIS — N182 Chronic kidney disease, stage 2 (mild): Secondary | ICD-10-CM | POA: Insufficient documentation

## 2016-03-10 DIAGNOSIS — J449 Chronic obstructive pulmonary disease, unspecified: Secondary | ICD-10-CM | POA: Insufficient documentation

## 2016-03-10 DIAGNOSIS — I252 Old myocardial infarction: Secondary | ICD-10-CM | POA: Diagnosis not present

## 2016-03-10 DIAGNOSIS — R0602 Shortness of breath: Secondary | ICD-10-CM | POA: Diagnosis not present

## 2016-03-10 DIAGNOSIS — I129 Hypertensive chronic kidney disease with stage 1 through stage 4 chronic kidney disease, or unspecified chronic kidney disease: Secondary | ICD-10-CM | POA: Diagnosis not present

## 2016-03-10 DIAGNOSIS — I2510 Atherosclerotic heart disease of native coronary artery without angina pectoris: Secondary | ICD-10-CM | POA: Insufficient documentation

## 2016-03-10 LAB — BASIC METABOLIC PANEL
Anion gap: 7 (ref 5–15)
BUN: 20 mg/dL (ref 6–20)
CO2: 25 mmol/L (ref 22–32)
Calcium: 9.4 mg/dL (ref 8.9–10.3)
Chloride: 105 mmol/L (ref 101–111)
Creatinine, Ser: 1.18 mg/dL (ref 0.61–1.24)
GFR calc Af Amer: >60 mL/min (ref >60)
GFR calc non Af Amer: >60 mL/min (ref >60)
Glucose, Bld: 256 mg/dL — ABNORMAL HIGH (ref 65–99)
Potassium: 4.2 mmol/L (ref 3.5–5.1)
Sodium: 137 mmol/L (ref 135–145)

## 2016-03-10 LAB — CBC
HCT: 40.6 % (ref 39.0–52.0)
Hemoglobin: 13.8 g/dL (ref 13.0–17.0)
MCH: 31.7 pg (ref 26.0–34.0)
MCHC: 34 g/dL (ref 30.0–36.0)
MCV: 93.1 fL (ref 78.0–100.0)
Platelets: 123 10*3/uL — ABNORMAL LOW (ref 150–400)
RBC: 4.36 MIL/uL (ref 4.22–5.81)
RDW: 14.3 % (ref 11.5–15.5)
WBC: 4.8 10*3/uL (ref 4.0–10.5)

## 2016-03-10 LAB — I-STAT TROPONIN, ED: Troponin i, poc: 0 ng/mL (ref 0.00–0.08)

## 2016-03-10 LAB — PROTIME-INR
INR: 2.07
Prothrombin Time: 23.6 seconds — ABNORMAL HIGH (ref 11.4–15.2)

## 2016-03-10 MED ORDER — METOPROLOL SUCCINATE ER 25 MG PO TB24
150.0000 mg | ORAL_TABLET | Freq: Every day | ORAL | Status: DC
  Administered 2016-03-10: 150 mg via ORAL
  Filled 2016-03-10: qty 6

## 2016-03-10 NOTE — ED Triage Notes (Signed)
Pt presents with c/o tachycardia. He has felt his heart racing over the past 3 days.He checked on the drugstore monitor and his heart rate was 117 this afternoon. His symptoms have been worse today.  He reports dizziness, sob. He denies pain.

## 2016-03-10 NOTE — ED Provider Notes (Signed)
[facility] DEPT Provider Note   CSN: [id number] Arrival date & time: 03/10/16  1606     History   Chief Complaint Chief Complaint  Patient presents with  . Tachycardia    HPI [name] Cook is a 65 y.o. male.  The history is provided by the patient.  Palpitations   This is a new problem. Episode onset: 3 days ago. The problem occurs daily. The problem has been resolved. Associated with: normal activity. On average, each episode lasts 30 seconds. Pertinent negatives include no diaphoresis, no fever, no chest pain, no near-syncope, no syncope, no abdominal pain and no shortness of breath. Associated symptoms comments: Light-headedness. He has tried nothing for the symptoms. Risk factors: pacemaker in place, h/o flutter s/p remote ablation.    Past Medical History:  Diagnosis Date  . Arthritis    "knees and lower back" (03/14/2013)  . Atrial flutter ([place])    radiofrequency ablation in 2001  . CAD (coronary artery disease)    a. Nonobstructive. Cardiac cath in 2001-50% mid RI, normal LM, LAD, RCA b. cath 10/16/2014 95% mid RCA treated with DES, 99% ost D1 medical management due to small aneurysmal segment  . Chronic anticoagulation    chronic Coumadin anticoagulation  . Chronic obstructive pulmonary disease ([place]) 04/20/2011  . Diabetes mellitus, type 2 ([place])   . GERD (gastroesophageal reflux disease)   . Hyperlipidemia   . Hypertension    with hypertensive heart disease  . Obesity   . Persistent atrial fibrillation (HCC)    recurrent atrial flutter since 2001 s/p DCCVs, multiple failed AADs, h/o tachy-mediated cardiomyopathy  . Shortness of breath    "can come on at any time" (03/14/2013)  . Sleep apnea    "dx'd; couldn't wear the mask" (03/14/2013)  . Tobacco abuse     Patient Active Problem List   Diagnosis Date Noted  . Acute coronary syndrome ([place])   . Unstable angina ([place]) 10/14/2014  . Insomnia 10/01/2014  . Diarrhea 04/13/2014  . Depression 03/09/2014  .  Elevated PSA 02/09/2014  . Prostate cancer screening 02/05/2014  . Coronary artery disease 11/06/2013  . Atrial fibrillation ([place]) 07/17/2013  . Long term current use of anticoagulant therapy 06/19/2013  . Pacemaker 04/30/2013  . Acute myocardial infarction, subendocardial infarction, initial episode of care ([place]) 03/15/2013  . Chest pain, midsternal 03/14/2013  . Hypokalemia 01/15/2013  . Abnormal LFTs 05/02/2012  . Chronic kidney disease, 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/26/2011  . Chronic obstructive pulmonary disease ([place]) 04/20/2011  . Atrial fibrillation   . Hypertension   . Hyperlipidemia   . Diabetes mellitus, type 2 ([place])   . Atrial flutter ([place])   . Tobacco abuse   . Chronic anticoagulation 04/06/2011  . GASTROESOPHAGEAL REFLUX DISEASE 05/16/2010    Past Surgical History:  Procedure Laterality Date  . ATRIAL FLUTTER ABLATION  2002   atrial flutter; subsequently developed atrial fibrillation  . AV NODE ABLATION  01/24/2013  . CARDIAC CATHETERIZATION  2002  . CARDIAC CATHETERIZATION N/A 10/16/2014   Procedure: Left Heart Cath and Coronary Angiography;  Surgeon: [name], MD;  Location: [facility];  Service: Cardiovascular;  Laterality: N/A;  . CARDIOVERSION  05/31/2011   Procedure: CARDIOVERSION;  Surgeon: Cristopher Estimable. Lattie Haw, MD;  Location: [facility];  Service: Cardiovascular;  Laterality: N/A;  . CARPAL TUNNEL RELEASE Left 1980's  . INSERT / REPLACE / REMOVE PACEMAKER  01/24/2013    Medtronic Adapta L dual-chamber pacemaker, serial number [id number]   . LEFT HEART  CATHETERIZATION WITH CORONARY ANGIOGRAM N/A 03/17/2013   Procedure: LEFT HEART CATHETERIZATION WITH CORONARY ANGIOGRAM;  Surgeon: Burnell Blanks, MD; LAD mild dz, D1 branch 100%, inferior branch 99%, CFX OK, RCA 50%, EF 65%    . LOOP RECORDER IMPLANT  2002  . PERMANENT PACEMAKER INSERTION N/A 01/24/2013   Procedure: PERMANENT PACEMAKER INSERTION;  Surgeon: [name], MD;  Location: [facility] CATH LAB;   Service: Cardiovascular;  Laterality: N/A;  . TIBIAL TUBERCLERPLASTY  ~ 2003       Home Medications    Prior to Admission medications   Medication Sig Start Date End Date Taking? Authorizing Provider  ADVAIR DISKUS 250-50 MCG/DOSE AEPB TAKE 1 INHALATION BY MOUTH TWICE DAILY INTO LUNGS 12/10/13   Orlena Sheldon, PA-C  amLODipine (NORVASC) 5 MG tablet TAKE 1 TABLET BY MOUTH ONCE DAILY 12/14/15   Almyra Deforest, PA  amoxicillin-clavulanate (AUGMENTIN) 875-125 MG tablet Take 1 tablet by mouth 2 (two) times daily. 09/01/15   Alycia Rossetti, MD  azithromycin (ZITHROMAX) 250 MG tablet Take 2 tablets x 1 day, then 1 tab daily for 4 days 09/01/15   Alycia Rossetti, MD  buPROPion [facility] SR) 150 MG 12 hr tablet Take 1 tablet (150 mg total) by mouth 2 (two) times daily. 11/22/15   Orlena Sheldon, PA-C  celecoxib (CELEBREX) 200 MG capsule Take 1 capsule (200 mg total) by mouth daily. 11/22/15   Orlena Sheldon, PA-C  clopidogrel (PLAVIX) 75 MG tablet TAKE 1 TABLET BY MOUTH DAILY 12/21/15   [name], MD  cyclobenzaprine (FLEXERIL) 10 MG tablet Take 1 tablet (10 mg total) by mouth 3 (three) times daily as needed for muscle spasms. 12/23/15   Lonie Peak Dixon, PA-C  digoxin (LANOXIN) 0.125 MG tablet Take 1 tablet (0.125 mg total) by mouth daily. 11/23/15   [name], MD  escitalopram (LEXAPRO) 10 MG tablet TAKE 1 TABLET BY MOUTH ONCE DAILY 12/06/15   Orlena Sheldon, PA-C  fish oil-omega-3 fatty acids 1000 MG capsule Take 1 g by mouth daily.      Historical Provider, MD  furosemide (LASIX) 20 MG tablet TAKE 1 TABLET BY MOUTH DAILY 10/13/15   Orlena Sheldon, PA-C  gabapentin (NEURONTIN) 300 MG capsule TAKE 1 CAPSULE (300 MG TOTAL) BY MOUTH 3 (THREE) TIMES DAILY. 03/06/16   Lonie Peak Dixon, PA-C  glucose blood test strip 1 each by Other route daily. Check Blood Sugar fasting each morning 03/22/15   Orlena Sheldon, PA-C  HYDROcodone-acetaminophen (NORCO/VICODIN) 5-325 MG tablet Take 1 tablet by mouth every 6 (six) hours as needed.  12/23/15   Lonie Peak Dixon, PA-C  isosorbide mononitrate (IMDUR) 30 MG 24 hr tablet TAKE 1 TABLET BY MOUTH ONCE DAILY 12/13/15   Orlena Sheldon, PA-C  lisinopril (PRINIVIL,ZESTRIL) 20 MG tablet TAKE 1 TABLET BY MOUTH ONCE DAILY 10/13/15   Orlena Sheldon, PA-C  metFORMIN (GLUCOPHAGE) 500 MG tablet TAKE 1 TABLET BY MOUTH TWICE DAILY WITH A MEAL 11/22/15   Orlena Sheldon, PA-C  metoprolol succinate (TOPROL-XL) 100 MG 24 hr tablet TAKE 1 TABLET BY MOUTH TWICE DAILY ALONG WITH 50MG  TAB FOR A TOTAL DAILY DOSE OF 150MG  TWICE DAILY 12/27/15   Orlena Sheldon, PA-C  metoprolol succinate (TOPROL-XL) 50 MG 24 hr tablet TAKE 1 TABLET BY MOUTH TWICE DAILY WITH 100MG  TABLET FOR TOTAL DAILY DOSE OF 150MG  TWICE DAILY 12/27/15   Orlena Sheldon, PA-C  nitroGLYCERIN (NITROSTAT) 0.4 MG SL tablet Place 1 tablet (  0.4 mg total) under the tongue every 5 (five) minutes as needed for chest pain. 06/17/14   Lonie Peak Dixon, PA-C  pantoprazole (PROTONIX) 40 MG tablet TAKE 1 TABLET BY MOUTH ONCE DAILY 09/14/15   Orlena Sheldon, PA-C  promethazine (PHENERGAN) 25 MG tablet Take 1 tablet (25 mg total) by mouth every 8 (eight) hours as needed for nausea or vomiting. 07/19/15   Lonie Peak Dixon, PA-C  rosuvastatin (CRESTOR) 20 MG tablet TAKE 1 TABLET BY MOUTH BEFORE BEDTIME 10/13/15   Orlena Sheldon, PA-C  warfarin (COUMADIN) 5 MG tablet TAKE 1 TABLET BY MOUTH ON MONDAYS AND 1/2 TABLET ALL OTHER DAYS 02/23/16   Orlena Sheldon, PA-C  zolpidem (AMBIEN) 10 MG tablet Take 10 mg by mouth at bedtime as needed. Reported on 08/25/2015 04/28/15   Historical Provider, MD    Family History Family History  Problem Relation Age of Onset  . Alzheimer's disease Mother   . Osteoporosis Mother     Social History Social History  Substance Use Topics  . Smoking status: Former Smoker    Packs/day: 1.00    Years: 42.00    Types: Cigarettes    Quit date: 12/31/2011  . Smokeless tobacco: Never Used  . Alcohol use Yes     Comment: 03/14/2013 "stopped drinking back in 2002; never had  problem w/it"     Allergies   Patient has no known allergies.   Review of Systems Review of Systems  Constitutional: Negative for diaphoresis and fever.  Respiratory: Negative for shortness of breath.   Cardiovascular: Positive for palpitations. Negative for chest pain, syncope and near-syncope.  Gastrointestinal: Negative for abdominal pain.  All other systems reviewed and are negative.    Physical Exam Updated Vital Signs BP 120/87 (BP Location: Right Arm)   Pulse 76   Temp 97.5 F (36.4 C) (Oral)   Resp 17   SpO2 96%   Physical Exam  Constitutional: He is oriented to person, place, and time. He appears well-developed and well-nourished. No distress.  HENT:  Head: Normocephalic and atraumatic.  Nose: Nose normal.  Eyes: Conjunctivae are normal.  Neck: Neck supple. No tracheal deviation present.  Cardiovascular: Normal rate, regular rhythm and normal heart sounds.   Pulmonary/Chest: Effort normal and breath sounds normal. No respiratory distress.  Abdominal: Soft. He exhibits no distension.  Neurological: He is alert and oriented to person, place, and time.  Skin: Skin is warm and dry.  Psychiatric: He has a normal mood and affect.  Vitals reviewed.    ED Treatments / Results  Labs (all labs ordered are listed, but only abnormal results are displayed) Labs Reviewed  BASIC METABOLIC PANEL - Abnormal; Notable for the following:       Result Value   Glucose, Bld 256 (*)    All other components within normal limits  CBC - Abnormal; Notable for the following:    Platelets 123 (*)    All other components within normal limits  PROTIME-INR - Abnormal; Notable for the following:    Prothrombin Time 23.6 (*)    All other components within normal limits  I-STAT TROPOININ, ED    EKG  EKG Interpretation  Date/Time:  Friday March 10 2016 22:32:37 EST Ventricular Rate:  77 PR Interval:    QRS Duration: 178 QT Interval:  466 QTC Calculation: 528 R  Axis:   -71 Text Interpretation:  VENTRICULAR PACED RHYTHM Since last tracing rate slower Otherwise no significant change Confirmed by Dhani Imel MD, Zorawar Strollo [phone])  on 03/10/2016 10:58:46 PM       Radiology Dg Chest 2 View  Result Date: 03/10/2016 CLINICAL DATA:  Three days of heart rate going up and down, shortness of breath, history atrial fibrillation, coronary disease post MI, hypertension, smoking, COPD, diabetes mellitus EXAM: CHEST  2 VIEW COMPARISON:  04/08/2015 FINDINGS: LEFT subclavian transvenous pacemaker leads project at RIGHT atrium and RIGHT ventricle, unchanged. Normal heart size, mediastinal contours, and pulmonary vascularity. Mild atherosclerotic calcification aortic arch. Lungs clear. No pleural effusion or pneumothorax. Bones appear demineralized. IMPRESSION: No acute abnormalities. Electronically Signed   By: Lavonia Dana M.D.   On: 03/10/2016 16:40    Procedures Procedures (including critical care time)  Medications Ordered in ED Medications - No data to display   Initial Impression / Assessment and Plan / ED Course  I have reviewed the triage vital signs and the nursing notes.  Pertinent labs & imaging results that were available during my care of the patient were reviewed by me and considered in my medical decision making (see chart for details).  Clinical Course     66 y.o. male presents with feeling of palpitations and noting his heart rate was up to 117 when checking earlier today. He is concerned for pacemaker malfunction he describes what may be paroxysmal atrial fibrillation, he is anticoagulated on coumadin and is therapeutic. Pacemaker was interrogated and showed 100% paced rhythm with no tachyarrhythmias, he is paced currently and has stable HR and BP while waiting and in ED. He was given his home dose of toprol XL. I recommended f/u with his cardiologist which he states is forthcoming on an outpatient basis. Plan to follow up with PCP as needed and return  precautions discussed for worsening or new concerning symptoms.   Final Clinical Impressions(s) / ED Diagnoses   Final diagnoses:  Palpitations    New Prescriptions New Prescriptions   No medications on file     Leo Grosser, MD 03/11/16 [phone]

## 2016-03-12 ENCOUNTER — Other Ambulatory Visit: Payer: Self-pay | Admitting: Physician Assistant

## 2016-03-13 NOTE — Telephone Encounter (Signed)
Medication refill for one time only.  Patient needs to be seen.  Letter sent for patient to call and schedule 

## 2016-03-17 ENCOUNTER — Ambulatory Visit (INDEPENDENT_AMBULATORY_CARE_PROVIDER_SITE_OTHER): Payer: Commercial Managed Care - HMO | Admitting: Physician Assistant

## 2016-03-17 ENCOUNTER — Encounter: Payer: Self-pay | Admitting: Physician Assistant

## 2016-03-17 VITALS — BP 116/80 | HR 93 | Temp 97.4°F | Resp 16 | Wt 220.0 lb

## 2016-03-17 DIAGNOSIS — J988 Other specified respiratory disorders: Secondary | ICD-10-CM

## 2016-03-17 DIAGNOSIS — B9789 Other viral agents as the cause of diseases classified elsewhere: Secondary | ICD-10-CM

## 2016-03-17 MED ORDER — NITROGLYCERIN 0.4 MG SL SUBL: 0.4000 mg | SUBLINGUAL_TABLET | SUBLINGUAL | 1 refills | Status: DC | PRN

## 2016-03-17 NOTE — Progress Notes (Signed)
Patient ID: [name] MRN: [id number], DOB: 06-10-1950, 65 y.o. Date of Encounter: 03/17/2016, 10:32 AM    Chief Complaint:  Chief Complaint  Patient presents with  . Cough    x3days  . head congested     HPI: 65 y.o. year old male says that symptoms just started about 3 days ago. Says that he has been using some Coricidin and is feeling better. Is that he was just having a little bit of congestion little bit of cough. Has had no fevers or chills. No sore throat or earache.  Later in the visit him also says that he needs to check his Coumadin level while he is here.  During the visit also says that he had to go to the ER recently because of his heart rate. Says they told him to follow-up here.     Home Meds:   Outpatient Medications Prior to Visit  Medication Sig Dispense Refill  . amLODipine (NORVASC) 5 MG tablet TAKE 1 TABLET BY MOUTH ONCE DAILY 90 tablet 1  . buPROPion (WELLBUTRIN SR) 150 MG 12 hr tablet Take 1 tablet (150 mg total) by mouth 2 (two) times daily. 180 tablet 1  . celecoxib (CELEBREX) 200 MG capsule Take 1 capsule (200 mg total) by mouth daily. 90 capsule 1  . clopidogrel (PLAVIX) 75 MG tablet TAKE 1 TABLET BY MOUTH DAILY 90 tablet 1  . cyclobenzaprine (FLEXERIL) 10 MG tablet Take 1 tablet (10 mg total) by mouth 3 (three) times daily as needed for muscle spasms. 30 tablet 0  . digoxin (LANOXIN) 0.125 MG tablet Take 1 tablet (0.125 mg total) by mouth daily. 90 tablet 2  . escitalopram (LEXAPRO) 10 MG tablet TAKE 1 TABLET BY MOUTH ONCE DAILY 30 tablet 5  . fish oil-omega-3 fatty acids 1000 MG capsule Take 1 g by mouth daily.      . fluticasone (FLONASE) 50 MCG/ACT nasal spray Place 1 spray into both nostrils daily as needed for allergies or rhinitis.    . furosemide (LASIX) 20 MG tablet TAKE 1 TABLET BY MOUTH DAILY 30 tablet 0  . gabapentin (NEURONTIN) 300 MG capsule TAKE 1 CAPSULE (300 MG TOTAL) BY MOUTH 3 (THREE) TIMES DAILY. (Patient taking differently: Take  300 mg by mouth 2 (two) times daily. ) 30 capsule 0  . glucose blood test strip 1 each by Other route daily. Check Blood Sugar fasting each morning 100 each 3  . HYDROcodone-acetaminophen (NORCO/VICODIN) 5-325 MG tablet Take 1 tablet by mouth every 6 (six) hours as needed. (Patient taking differently: Take 1 tablet by mouth every 6 (six) hours as needed for moderate pain. ) 30 tablet 0  . isosorbide mononitrate (IMDUR) 30 MG 24 hr tablet TAKE 1 TABLET BY MOUTH ONCE DAILY 90 tablet 1  . lisinopril (PRINIVIL,ZESTRIL) 20 MG tablet TAKE 1 TABLET BY MOUTH ONCE DAILY 30 tablet 0  . metFORMIN (GLUCOPHAGE) 500 MG tablet TAKE 1 TABLET BY MOUTH TWICE DAILY WITH A MEAL 180 tablet 1  . metoprolol succinate (TOPROL-XL) 100 MG 24 hr tablet TAKE 1 TABLET BY MOUTH TWICE DAILY ALONG WITH 50MG  TAB FOR A TOTAL DAILY DOSE OF 150MG  TWICE DAILY 180 tablet 1  . metoprolol succinate (TOPROL-XL) 50 MG 24 hr tablet TAKE 1 TABLET BY MOUTH TWICE DAILY WITH 100MG  TABLET FOR TOTAL DAILY DOSE OF 150MG  TWICE DAILY 180 tablet 1  . promethazine (PHENERGAN) 25 MG tablet Take 1 tablet (25 mg total) by mouth every 8 (eight) hours as needed  for nausea or vomiting. 20 tablet 0  . rosuvastatin (CRESTOR) 20 MG tablet TAKE 1 TABLET BY MOUTH BEFORE BEDTIME 30 tablet 0  . warfarin (COUMADIN) 5 MG tablet TAKE 1 TABLET BY MOUTH ON MONDAYS AND 1/2 TABLET ALL OTHER DAYS (Patient taking differently: Take 2.5-5 mg by mouth daily. TAKE 1 TABLET BY MOUTH ON MONDAYS AND 1/2 TABLET ALL OTHER DAYS) 90 tablet 1  . zolpidem (AMBIEN) 10 MG tablet Take 10 mg by mouth at bedtime as needed for sleep. Reported on 08/25/2015  1  . nitroGLYCERIN (NITROSTAT) 0.4 MG SL tablet Place 1 tablet (0.4 mg total) under the tongue every 5 (five) minutes as needed for chest pain. 25 tablet 1  . pantoprazole (PROTONIX) 40 MG tablet TAKE 1 TABLET BY MOUTH ONCE DAILY 30 tablet 0   No facility-administered medications prior to visit.     Allergies: No Known Allergies     Review of Systems: See HPI for pertinent ROS. All other ROS negative.    Physical Exam: Blood pressure 116/80, pulse 93, temperature 97.4 F (36.3 C), temperature source Oral, resp. rate 16, weight 220 lb (99.8 kg), SpO2 93 %., Body mass index is 29.84 kg/m. General:  WNWD WM Appears in no acute distress. HEENT: Normocephalic, atraumatic, eyes without discharge, sclera non-icteric, nares are without discharge. Bilateral auditory canals clear, TM's are without perforation, pearly grey and translucent with reflective cone of light bilaterally. Oral cavity moist, posterior pharynx without exudate, erythema, peritonsillar abscess.  Neck: Supple. No thyromegaly. No lymphadenopathy. Lungs: Clear bilaterally to auscultation without wheezes, rales, or rhonchi. Breathing is unlabored. Heart: Regular rhythm. No murmurs, rubs, or gallops. Msk:  Strength and tone normal for age. Extremities/Skin: Warm and dry. Neuro: Alert and oriented X 3. Moves all extremities spontaneously. Gait is normal. CNII-XII grossly in tact. Psych:  Responds to questions appropriately with a normal affect.     ASSESSMENT AND PLAN:  65 y.o. year old male with  1. Viral respiratory infection Cont taking the chloracetic. Follow-up if symptoms worsen significantly or persist greater than 7-10 days.  Reviewed that INR was checked 03/10/16 and was therapeutic at 2.0. Does not need to recheck now. We'll recheck here 4 weeks.  Reviewed ER note. Pacemaker was interrogated and showed good pacemaker function and no significant arrhythmias. Reassured patient.   Signed, [address] [place], [place], [facility] 03/17/2016 10:32 AM

## 2016-03-19 ENCOUNTER — Other Ambulatory Visit: Payer: Self-pay | Admitting: Physician Assistant

## 2016-03-19 DIAGNOSIS — E1142 Type 2 diabetes mellitus with diabetic polyneuropathy: Secondary | ICD-10-CM

## 2016-03-20 ENCOUNTER — Other Ambulatory Visit: Payer: Self-pay | Admitting: Physician Assistant

## 2016-03-20 ENCOUNTER — Telehealth: Payer: Self-pay

## 2016-03-20 DIAGNOSIS — E1142 Type 2 diabetes mellitus with diabetic polyneuropathy: Secondary | ICD-10-CM

## 2016-03-20 MED ORDER — AZITHROMYCIN 250 MG PO TABS: ORAL_TABLET | ORAL | 0 refills | Status: DC

## 2016-03-20 NOTE — Telephone Encounter (Signed)
[facility] called and stated there was an interaction between digoxin and azithromycin  Spoke with MBD she states it is fine for pt to take the azithromycin

## 2016-03-20 NOTE — Telephone Encounter (Signed)
Pt states he is still coughing really bad and is req another round of antibiotic be called in if possible. Pt was seen in office 12-1

## 2016-03-20 NOTE — Telephone Encounter (Signed)
Rx sent in

## 2016-03-20 NOTE — Telephone Encounter (Signed)
Azithromycin 250mg  Day 1: Take 2 daily.  Days 2-5: Take 1 daily.

## 2016-03-21 NOTE — Telephone Encounter (Signed)
Rx filled per protocol  

## 2016-04-12 ENCOUNTER — Other Ambulatory Visit: Payer: Self-pay | Admitting: Physician Assistant

## 2016-04-13 NOTE — Telephone Encounter (Signed)
rx filled per protocol  

## 2016-04-20 ENCOUNTER — Ambulatory Visit (INDEPENDENT_AMBULATORY_CARE_PROVIDER_SITE_OTHER): Payer: Medicare HMO | Admitting: Physician Assistant

## 2016-04-20 VITALS — BP 108/80 | HR 80 | Temp 97.4°F | Resp 16 | Wt 229.0 lb

## 2016-04-20 DIAGNOSIS — Z7901 Long term (current) use of anticoagulants: Secondary | ICD-10-CM | POA: Diagnosis not present

## 2016-04-20 DIAGNOSIS — Z23 Encounter for immunization: Secondary | ICD-10-CM

## 2016-04-20 LAB — PT WITH INR/FINGERSTICK
INR, fingerstick: 3.3 — ABNORMAL HIGH (ref 0.80–1.20)
PT, fingerstick: 39.8 seconds — ABNORMAL HIGH (ref 10.4–12.5)

## 2016-04-20 NOTE — Addendum Note (Signed)
Addended by: Vonna Kotyk A on: 04/20/2016 10:46 AM   Modules accepted: Orders

## 2016-04-20 NOTE — Progress Notes (Signed)
Patient ID: ANAND CHARITY MRN: [id number], DOB: 1950-11-25, 66 y.o. Date of Encounter: 04/20/2016, 10:12 AM    Chief Complaint:  Chief Complaint  Patient presents with  . INR check     HPI: 66 y.o. year old male here for INR check.   Coumadin is 5 mg tablets. He has takes a whole 5 mg on Mondays. He takes a half tablet (2.5 mg) all other days. He has had no skipped doses. He has had no bleeding.  Wants to get a flu shot while he is here. No other concerns.     Home Meds:   Outpatient Medications Prior to Visit  Medication Sig Dispense Refill  . amLODipine (NORVASC) 5 MG tablet TAKE 1 TABLET BY MOUTH ONCE DAILY 90 tablet 1  . buPROPion (WELLBUTRIN SR) 150 MG 12 hr tablet Take 1 tablet (150 mg total) by mouth 2 (two) times daily. 180 tablet 1  . celecoxib (CELEBREX) 200 MG capsule Take 1 capsule (200 mg total) by mouth daily. 90 capsule 1  . clopidogrel (PLAVIX) 75 MG tablet TAKE 1 TABLET BY MOUTH DAILY 90 tablet 1  . cyclobenzaprine (FLEXERIL) 10 MG tablet Take 1 tablet (10 mg total) by mouth 3 (three) times daily as needed for muscle spasms. 30 tablet 0  . digoxin (LANOXIN) 0.125 MG tablet Take 1 tablet (0.125 mg total) by mouth daily. 90 tablet 2  . escitalopram (LEXAPRO) 10 MG tablet TAKE 1 TABLET BY MOUTH ONCE DAILY 30 tablet 5  . fish oil-omega-3 fatty acids 1000 MG capsule Take 1 g by mouth daily.      . fluticasone (FLONASE) 50 MCG/ACT nasal spray Place 1 spray into both nostrils daily as needed for allergies or rhinitis.    . furosemide (LASIX) 20 MG tablet TAKE 1 TABLET BY MOUTH DAILY 30 tablet 2  . gabapentin (NEURONTIN) 300 MG capsule TAKE 1 CAPSULE (300 MG TOTAL) BY MOUTH 3 (THREE) TIMES DAILY. 90 capsule 0  . glucose blood test strip 1 each by Other route daily. Check Blood Sugar fasting each morning 100 each 3  . HYDROcodone-acetaminophen (NORCO/VICODIN) 5-325 MG tablet Take 1 tablet by mouth every 6 (six) hours as needed. (Patient taking differently: Take 1 tablet  by mouth every 6 (six) hours as needed for moderate pain. ) 30 tablet 0  . isosorbide mononitrate (IMDUR) 30 MG 24 hr tablet TAKE 1 TABLET BY MOUTH ONCE DAILY 90 tablet 1  . lisinopril (PRINIVIL,ZESTRIL) 20 MG tablet TAKE 1 TABLET BY MOUTH ONCE DAILY 30 tablet 0  . metFORMIN (GLUCOPHAGE) 500 MG tablet TAKE 1 TABLET BY MOUTH TWICE DAILY WITH A MEAL 180 tablet 1  . metoprolol succinate (TOPROL-XL) 100 MG 24 hr tablet TAKE 1 TABLET BY MOUTH TWICE DAILY ALONG WITH 50MG  TAB FOR A TOTAL DAILY DOSE OF 150MG  TWICE DAILY 180 tablet 1  . metoprolol succinate (TOPROL-XL) 50 MG 24 hr tablet TAKE 1 TABLET BY MOUTH TWICE DAILY WITH 100MG  TABLET FOR TOTAL DAILY DOSE OF 150MG  TWICE DAILY 180 tablet 1  . nitroGLYCERIN (NITROSTAT) 0.4 MG SL tablet Place 1 tablet (0.4 mg total) under the tongue every 5 (five) minutes as needed for chest pain. 25 tablet 1  . pantoprazole (PROTONIX) 40 MG tablet TAKE 1 TABLET BY MOUTH ONCE DAILY 30 tablet 0  . promethazine (PHENERGAN) 25 MG tablet Take 1 tablet (25 mg total) by mouth every 8 (eight) hours as needed for nausea or vomiting. 20 tablet 0  . rosuvastatin (CRESTOR) 20 MG  tablet TAKE 1 TABLET BY MOUTH BEFORE BEDTIME 30 tablet 0  . warfarin (COUMADIN) 5 MG tablet TAKE 1 TABLET BY MOUTH ON MONDAYS AND 1/2 TABLET ALL OTHER DAYS (Patient taking differently: Take 2.5-5 mg by mouth daily. TAKE 1 TABLET BY MOUTH ON MONDAYS AND 1/2 TABLET ALL OTHER DAYS) 90 tablet 1  . zolpidem (AMBIEN) 10 MG tablet Take 10 mg by mouth at bedtime as needed for sleep. Reported on 08/25/2015  1  . azithromycin (ZITHROMAX) 250 MG tablet Day 1 take 2 daily, Days 2-5  Take 1 daily (Patient not taking: Reported on 04/20/2016) 6 tablet 0   No facility-administered medications prior to visit.     Allergies: No Known Allergies    Review of Systems: See HPI for pertinent ROS. All other ROS negative.    Physical Exam: Blood pressure 108/80, pulse 80, temperature 97.4 F (36.3 C), temperature source Oral,  resp. rate 16, weight 229 lb (103.9 kg), SpO2 97 %., Body mass index is 31.06 kg/m. General:  WNWD WM. Appears in no acute distress. Neck: Supple. No thyromegaly. No lymphadenopathy. Lungs: Clear bilaterally to auscultation without wheezes, rales, or rhonchi. Breathing is unlabored. Heart: Irregular rhythm.  Msk:  Strength and tone normal for age. Extremities/Skin: Warm and dry. Neuro: Alert and oriented X 3. Moves all extremities spontaneously. Gait is normal. CNII-XII grossly in tact. Psych:  Responds to questions appropriately with a normal affect.     ASSESSMENT AND PLAN:  66 y.o. year old male with  1. Long-term (current) use of anticoagulants INR is 3.3. Reviewed that last INR was performed at the hospital 03/10/16 was 2.07. Have him continue current Coumadin dose. Whole tablet 5 mg on Mondays. A half a tablet (2 .5 mg) all other days. - PT with INR/Fingerstick   Signed, [address] [place], [place], [facility] 04/20/2016 10:12 AM

## 2016-04-24 ENCOUNTER — Other Ambulatory Visit: Payer: Self-pay | Admitting: Physician Assistant

## 2016-04-24 NOTE — Telephone Encounter (Signed)
rx filled per protocol  

## 2016-04-28 ENCOUNTER — Telehealth: Payer: Self-pay

## 2016-04-28 NOTE — Telephone Encounter (Signed)
[name] called and stated [name] was sick and is req  a zpak I explained pt needs to be seen. Appt sch for 1-15

## 2016-04-29 ENCOUNTER — Other Ambulatory Visit: Payer: Self-pay | Admitting: Physician Assistant

## 2016-04-29 DIAGNOSIS — E1142 Type 2 diabetes mellitus with diabetic polyneuropathy: Secondary | ICD-10-CM

## 2016-05-01 ENCOUNTER — Encounter: Payer: Self-pay | Admitting: Family Medicine

## 2016-05-01 ENCOUNTER — Ambulatory Visit (INDEPENDENT_AMBULATORY_CARE_PROVIDER_SITE_OTHER): Payer: Medicare HMO | Admitting: Family Medicine

## 2016-05-01 VITALS — BP 110/82 | HR 82 | Temp 97.9°F | Resp 18 | Ht 72.0 in | Wt 230.0 lb

## 2016-05-01 DIAGNOSIS — J209 Acute bronchitis, unspecified: Secondary | ICD-10-CM | POA: Diagnosis not present

## 2016-05-01 MED ORDER — PREDNISONE 20 MG PO TABS: 20.0000 mg | ORAL_TABLET | Freq: Every day | ORAL | 0 refills | Status: DC

## 2016-05-01 MED ORDER — AMOXICILLIN 875 MG PO TABS: 875.0000 mg | ORAL_TABLET | Freq: Two times a day (BID) | ORAL | 0 refills | Status: DC

## 2016-05-01 NOTE — Patient Instructions (Signed)
Take antibiotics as prescribed Take prednisone  [place] for cough medicine over there counter  F/U as previous

## 2016-05-01 NOTE — Progress Notes (Signed)
   Subjective:    Patient ID: [name] Cook, male    DOB: 1950-07-05, 66 y.o.   MRN: [id number]  Patient presents for Illness (x1 week- productive cough with yellow mucus, chest congestion)   Cough with production thick phlemgm, wheezing for past week. Mild headache and dizziness with coughing. No fever, no chills, had body aches last night.  Flu shot done  Taking coricidan  Has history of emphysema, quite smoking > 3 years ago    Review Of Systems:  GEN- denies fatigue, fever, weight loss,weakness, recent illness HEENT- denies eye drainage, change in vision, +nasal discharge, CVS- denies chest pain, palpitations RESP- denies SOB, +cough, +wheeze ABD- denies N/V, change in stools, abd pain GU- denies dysuria, hematuria, dribbling, incontinence MSK- denies joint pain, muscle aches, injury Neuro-+ headache, +dizziness, syncope, seizure activity       Objective:    BP 110/82 (BP Location: Left Arm, Patient Position: Sitting, Cuff Size: Large)   Pulse 82   Temp 97.9 F (36.6 C) (Oral)   Resp 18   Ht 6' (1.829 m)   Wt 230 lb (104.3 kg)   SpO2 98%   BMI 31.19 kg/m  GEN- NAD, alert and oriented x3 HEENT- PERRL, EOMI, non injected sclera, pink conjunctiva, MMM, oropharynx clear, nares clear rhinorrhea, wax in canals, no maxillar sinus tenderness  Neck- Supple, no LAD CVS- RRR, no murmur RESP-few scattered expiratory wheeze, normal WOB, good air movement, upper airway congestion, no retractions  EXT- No edema Pulses- Radial  2+        Assessment & Plan:      Problem List Items Addressed This Visit    None    Visit Diagnoses    Acute bronchitis, unspecified organism    -  Primary   treat for acute illness, not on any maintance inhalers for emphysema has not required, will treat with prednisone, amoxicillin due to dig/coumadin, recheck coumadin in 3 weeks as scheduled. Use Coricidan. Advised he can take vitamin C tablet      Note: This dictation was prepared with Dragon  dictation along with smaller phrase technology. Any transcriptional errors that result from this process are unintentional.

## 2016-05-10 ENCOUNTER — Ambulatory Visit (INDEPENDENT_AMBULATORY_CARE_PROVIDER_SITE_OTHER): Payer: Medicare HMO | Admitting: Internal Medicine

## 2016-05-10 ENCOUNTER — Encounter: Payer: Self-pay | Admitting: Internal Medicine

## 2016-05-10 VITALS — BP 134/82 | HR 88 | Ht 72.0 in | Wt 228.6 lb

## 2016-05-10 DIAGNOSIS — Z950 Presence of cardiac pacemaker: Secondary | ICD-10-CM | POA: Diagnosis not present

## 2016-05-10 DIAGNOSIS — I482 Chronic atrial fibrillation, unspecified: Secondary | ICD-10-CM

## 2016-05-10 LAB — CUP PACEART INCLINIC DEVICE CHECK
Battery Impedance: 157 Ohm
Battery Remaining Longevity: 153 mo
Battery Voltage: 2.8 V
Brady Statistic RV Percent Paced: 100 %
Date Time Interrogation Session: 20180124173021
Implantable Lead Implant Date: 20141010
Implantable Lead Implant Date: 20141010
Implantable Lead Location: 753859
Implantable Lead Location: 753860
Implantable Lead Model: 5076
Implantable Lead Model: 5076
Implantable Pulse Generator Implant Date: 20141010
Lead Channel Impedance Value: 67 Ohm
Lead Channel Impedance Value: 761 Ohm
Lead Channel Pacing Threshold Amplitude: 0.75 V
Lead Channel Pacing Threshold Pulse Width: 0.4 ms
Lead Channel Setting Pacing Amplitude: 2 V
Lead Channel Setting Pacing Pulse Width: 0.4 ms
Lead Channel Setting Sensing Sensitivity: 4 mV

## 2016-05-10 NOTE — Patient Instructions (Addendum)
Medication Instructions:  Your physician recommends that you continue on your current medications as directed. Please refer to the Current Medication list given to you today.   Labwork: None Ordered   Testing/Procedures: None Ordered   Follow-Up: Your physician wants you to follow-up in: 1 year with [name].  You will receive a reminder letter in the mail two months in advance. If you don't receive a letter, please call our office to schedule the follow-up appointment.  Remote monitoring is used to monitor your Pacemaker from home. This monitoring reduces the number of office visits required to check your device to one time per year. It allows [place] to keep an eye on the functioning of your device to ensure it is working properly. You are scheduled for a device check from home on 08/09/16. You may send your transmission at any time that day. If you have a wireless device, the transmission will be sent automatically. After your physician reviews your transmission, you will receive a postcard with your next transmission date.     Any Other Special Instructions Will Be Listed Below (If Applicable).     If you need a refill on your cardiac medications before your next appointment, please call your pharmacy.

## 2016-05-10 NOTE — Progress Notes (Signed)
HPI Mr. Jian returns today for followup. He is a very pleasant 66 year old man with a history of paroxysmal atrial fibrillation which had become persistent, associated with a very rapid ventricular response and heart rates in the 180 beats per minute. 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 He underwent AV node ablation and insertion of a dual-chamber pacemaker. In the interim he denies chest pain,  peripheral edema, or syncope. He has not been hospitalized. His appetite is good and he remains active. No bleeding despite taking both coumadin and plavix. When he is in a hurry, he will get sob if he tries to walk fast. No Known Allergies   Current Outpatient Prescriptions  Medication Sig Dispense Refill  . amLODipine (NORVASC) 5 MG tablet TAKE 1 TABLET BY MOUTH ONCE DAILY 90 tablet 1  . buPROPion (WELLBUTRIN SR) 150 MG 12 hr tablet Take 1 tablet (150 mg total) by mouth 2 (two) times daily. 180 tablet 1  . celecoxib (CELEBREX) 200 MG capsule Take 1 capsule (200 mg total) by mouth daily. 90 capsule 1  . clopidogrel (PLAVIX) 75 MG tablet TAKE 1 TABLET BY MOUTH DAILY 90 tablet 1  . cyclobenzaprine (FLEXERIL) 10 MG tablet Take 1 tablet (10 mg total) by mouth 3 (three) times daily as needed for muscle spasms. 30 tablet 0  . digoxin (LANOXIN) 0.125 MG tablet Take 1 tablet (0.125 mg total) by mouth daily. 90 tablet 2  . escitalopram (LEXAPRO) 10 MG tablet TAKE 1 TABLET BY MOUTH ONCE DAILY 30 tablet 5  . fish oil-omega-3 fatty acids 1000 MG capsule Take 1 g by mouth daily.      . fluticasone (FLONASE) 50 MCG/ACT nasal spray Place 1 spray into both nostrils daily as needed for allergies or rhinitis.    . furosemide (LASIX) 20 MG tablet TAKE 1 TABLET BY MOUTH DAILY 30 tablet 2  . gabapentin (NEURONTIN) 300 MG capsule TAKE 1 CAPSULE (300 MG TOTAL) BY MOUTH 3 (THREE) TIMES DAILY. 90 capsule 0  . glucose blood test strip 1 each by Other route  daily. Check Blood Sugar fasting each morning 100 each 3  . HYDROcodone-acetaminophen (NORCO/VICODIN) 5-325 MG tablet Take 1 tablet by mouth every 6 (six) hours as needed. 30 tablet 0  . isosorbide mononitrate (IMDUR) 30 MG 24 hr tablet TAKE 1 TABLET BY MOUTH ONCE DAILY 90 tablet 1  . lisinopril (PRINIVIL,ZESTRIL) 20 MG tablet TAKE 1 TABLET BY MOUTH ONCE DAILY 30 tablet 0  . metFORMIN (GLUCOPHAGE) 500 MG tablet TAKE 1 TABLET BY MOUTH TWICE DAILY WITH A MEAL 180 tablet 1  . metoprolol succinate (TOPROL-XL) 100 MG 24 hr tablet TAKE 1 TABLET BY MOUTH TWICE DAILY ALONG WITH 50MG  TAB FOR A TOTAL DAILY DOSE OF 150MG  TWICE DAILY 180 tablet 1  . metoprolol succinate (TOPROL-XL) 50 MG 24 hr tablet TAKE 1 TABLET BY MOUTH TWICE DAILY WITH 100MG  TABLET FOR TOTAL DAILY DOSE OF 150MG  TWICE DAILY 180 tablet 1  . nitroGLYCERIN (NITROSTAT) 0.4 MG SL tablet Place 1 tablet (0.4 mg total) under the tongue every 5 (five) minutes as needed for chest pain. 25 tablet 1  . pantoprazole (PROTONIX) 40 MG tablet TAKE 1 TABLET BY MOUTH ONCE DAILY 30 tablet 0  . predniSONE (DELTASONE) 20 MG tablet Take 1 tablet (20 mg total) by mouth daily with breakfast. 5 tablet 0  . promethazine (PHENERGAN) 25 MG tablet Take 1 tablet (25 mg total) by mouth every 8 (  eight) hours as needed for nausea or vomiting. 20 tablet 0  . rosuvastatin (CRESTOR) 20 MG tablet TAKE 1 TABLET BY MOUTH BEFORE BEDTIME 30 tablet 0  . warfarin (COUMADIN) 5 MG tablet TAKE 1 TABLET BY MOUTH ON MONDAYS AND 1/2 TABLET ALL OTHER DAYS (Patient taking differently: Take 2.5-5 mg by mouth daily. TAKE 1 TABLET BY MOUTH ON MONDAYS AND 1/2 TABLET ALL OTHER DAYS) 90 tablet 1  . zolpidem (AMBIEN) 10 MG tablet Take 10 mg by mouth at bedtime as needed for sleep. Reported on 08/25/2015  1   No current facility-administered medications for this visit.      Past Medical History:  Diagnosis Date  . Arthritis    "knees and lower back" (03/14/2013)  . Atrial flutter ([place])     radiofrequency ablation in 2001  . CAD (coronary artery disease)    a. Nonobstructive. Cardiac cath in 2001-50% mid RI, normal LM, LAD, RCA b. cath 10/16/2014 95% mid RCA treated with DES, 99% ost D1 medical management due to small aneurysmal segment  . Chronic anticoagulation    chronic Coumadin anticoagulation  . Chronic obstructive pulmonary disease ([place]) 04/20/2011  . Diabetes mellitus, type 2 ([place])   . GERD (gastroesophageal reflux disease)   . Hyperlipidemia   . Hypertension    with hypertensive heart disease  . Obesity   . Persistent atrial fibrillation ([facility])    recurrent atrial flutter since 2001 s/p DCCVs, multiple failed AADs, h/o tachy-mediated cardiomyopathy  . Shortness of breath    "can come on at any time" (03/14/2013)  . Sleep apnea    "dx'd; couldn't wear the mask" (03/14/2013)  . Tobacco abuse     ROS:   All systems reviewed and negative except as noted in the HPI.   Past Surgical History:  Procedure Laterality Date  . ATRIAL FLUTTER ABLATION  2002   atrial flutter; subsequently developed atrial fibrillation  . AV NODE ABLATION  01/24/2013  . CARDIAC CATHETERIZATION  2002  . CARDIAC CATHETERIZATION N/A 10/16/2014   Procedure: Left Heart Cath and Coronary Angiography;  Surgeon: [name], MD;  Location: [facility];  Service: Cardiovascular;  Laterality: N/A;  . CARDIOVERSION  05/31/2011   Procedure: CARDIOVERSION;  Surgeon: Cristopher Estimable. Lattie Haw, MD;  Location: [facility];  Service: Cardiovascular;  Laterality: N/A;  . CARPAL TUNNEL RELEASE Left 1980's  . INSERT / REPLACE / REMOVE PACEMAKER  01/24/2013    Medtronic Adapta L dual-chamber pacemaker, serial number [id number]   . LEFT HEART CATHETERIZATION WITH CORONARY ANGIOGRAM N/A 03/17/2013   Procedure: LEFT HEART CATHETERIZATION WITH CORONARY ANGIOGRAM;  Surgeon: Burnell Blanks, MD; LAD mild dz, D1 branch 100%, inferior branch 99%, CFX OK, RCA 50%, EF 65%    . LOOP RECORDER IMPLANT  2002  . PERMANENT  PACEMAKER INSERTION N/A 01/24/2013   Procedure: PERMANENT PACEMAKER INSERTION;  Surgeon: [name], MD;  Location[facility];  Service: Cardiovascular;  Laterality: N/A;  . TIBIAL TUBERCLERPLASTY  ~ 2003     Family History  Problem Relation Age of Onset  . Alzheimer's disease Mother   . Osteoporosis Mother      Social History   Social History  . Marital status: Legally Separated    Spouse name: N/A  . Number of children: 1  . Years of education: N/A   Occupational History  . Unemployed   .  Unemployed   Social History Main Topics  . Smoking status: Former Smoker    Packs/day: 1.00  Years: 42.00    Types: Cigarettes    Quit date: 12/31/2011  . Smokeless tobacco: Never Used  . Alcohol use Yes     Comment: 03/14/2013 "stopped drinking back in 2002; never had problem w/it"  . Drug use: No  . Sexual activity: Not Currently   Other Topics Concern  . Not on file   Social History Narrative   Unable to afford medications   Resides with girlfriend      Pt lives in [place]   Disabled (arthritis), previously worked at an [employer] and [employer] treatment center.           BP 134/82   Pulse 88   Ht 6' (1.829 m)   Wt 228 lb 9.6 oz (103.7 kg)   BMI 31.00 kg/m   Physical Exam:  Well appearing middle aged man, NAD HEENT: Unremarkable Neck:  7 cm JVD, no thyromegally Lymphatics:  No adenopathy Back:  No CVA tenderness Lungs:  Clear with no wheezes HEART:  Regular rate rhythm, no murmurs, no rubs, no clicks Abd:  soft, positive bowel sounds, no organomegally, no rebound, no guarding Ext:  2 plus pulses, no edema, no cyanosis, no clubbing Skin:  No rashes no nodules Neuro:  CN II through XII intact, motor grossly intact  DEVICE  Normal device function.  See PaceArt for details.   Assess/Plan: 1. Atrial fib with a controlled VR - he is pacing 99% of the time. He will continue his current meds 2. HTN - his blood pressure is stable.  3. PPM - his  Medtronic device is working normally. Will recheck in several months. We have reprogrammed him to VVIR at 70/min from [place]. 4. Chronic diastolic heart failure - his symptoms are class 2. He will continue his current meds. If he gets worse, we would consider adding a LV lead or a HIS bundle lead.  Toshiyuki Fredell,M.D.  Mikle Bosworth.D.

## 2016-05-14 ENCOUNTER — Other Ambulatory Visit: Payer: Self-pay | Admitting: Physician Assistant

## 2016-05-16 NOTE — Telephone Encounter (Signed)
Rx filled per protocol  

## 2016-05-19 ENCOUNTER — Encounter: Payer: Self-pay | Admitting: Physician Assistant

## 2016-05-20 ENCOUNTER — Other Ambulatory Visit: Payer: Self-pay | Admitting: Physician Assistant

## 2016-05-22 NOTE — Telephone Encounter (Signed)
Refill appropriate 

## 2016-05-23 ENCOUNTER — Other Ambulatory Visit: Payer: Self-pay | Admitting: Physician Assistant

## 2016-05-23 DIAGNOSIS — B9789 Other viral agents as the cause of diseases classified elsewhere: Secondary | ICD-10-CM

## 2016-05-23 DIAGNOSIS — J988 Other specified respiratory disorders: Principal | ICD-10-CM

## 2016-05-24 ENCOUNTER — Ambulatory Visit (INDEPENDENT_AMBULATORY_CARE_PROVIDER_SITE_OTHER): Payer: Medicare HMO | Admitting: Physician Assistant

## 2016-05-24 ENCOUNTER — Encounter: Payer: Self-pay | Admitting: Physician Assistant

## 2016-05-24 VITALS — BP 108/78 | HR 82 | Temp 97.5°F | Resp 16 | Wt 230.2 lb

## 2016-05-24 DIAGNOSIS — I2510 Atherosclerotic heart disease of native coronary artery without angina pectoris: Secondary | ICD-10-CM | POA: Diagnosis not present

## 2016-05-24 DIAGNOSIS — F32A Depression, unspecified: Secondary | ICD-10-CM

## 2016-05-24 DIAGNOSIS — I10 Essential (primary) hypertension: Secondary | ICD-10-CM

## 2016-05-24 DIAGNOSIS — E119 Type 2 diabetes mellitus without complications: Secondary | ICD-10-CM

## 2016-05-24 DIAGNOSIS — K219 Gastro-esophageal reflux disease without esophagitis: Secondary | ICD-10-CM

## 2016-05-24 DIAGNOSIS — N182 Chronic kidney disease, stage 2 (mild): Secondary | ICD-10-CM | POA: Diagnosis not present

## 2016-05-24 DIAGNOSIS — R972 Elevated prostate specific antigen [PSA]: Secondary | ICD-10-CM

## 2016-05-24 DIAGNOSIS — E785 Hyperlipidemia, unspecified: Secondary | ICD-10-CM | POA: Diagnosis not present

## 2016-05-24 DIAGNOSIS — F329 Major depressive disorder, single episode, unspecified: Secondary | ICD-10-CM

## 2016-05-24 DIAGNOSIS — Z7901 Long term (current) use of anticoagulants: Secondary | ICD-10-CM

## 2016-05-24 DIAGNOSIS — J439 Emphysema, unspecified: Secondary | ICD-10-CM

## 2016-05-24 DIAGNOSIS — I4892 Unspecified atrial flutter: Secondary | ICD-10-CM | POA: Diagnosis not present

## 2016-05-24 LAB — COMPLETE METABOLIC PANEL WITH GFR
ALT: 24 U/L (ref 9–46)
AST: 24 U/L (ref 10–35)
Albumin: 4 g/dL (ref 3.6–5.1)
Alkaline Phosphatase: 63 U/L (ref 40–115)
BUN: 22 mg/dL (ref 7–25)
CO2: 25 mmol/L (ref 20–31)
Calcium: 9.1 mg/dL (ref 8.6–10.3)
Chloride: 104 mmol/L (ref 98–110)
Creat: 1.16 mg/dL (ref 0.70–1.25)
GFR, Est African American: 77 mL/min (ref >=60)
GFR, Est Non African American: 66 mL/min (ref >=60)
Glucose, Bld: 199 mg/dL — ABNORMAL HIGH (ref 70–99)
Potassium: 4.4 mmol/L (ref 3.5–5.3)
Sodium: 139 mmol/L (ref 135–146)
Total Bilirubin: 0.8 mg/dL (ref 0.2–1.2)
Total Protein: 6.2 g/dL (ref 6.1–8.1)

## 2016-05-24 NOTE — Progress Notes (Signed)
D   Patient ID: [name] Cook MRN: [id number], DOB: [birth year], 66 y.o. Date of Encounter: @DATE @  Chief Complaint:  Chief Complaint  Patient presents with  . coumadin check    HPI: 66 y.o. year old white male  presents for Medicare Annual Wellness Exam as well as Routine followup office visit to f/u Diabetes, etc. Also Here to check his PT/INR.    He had his first office visit with me on 06/19/2013.   He had been seen in our office in the past by [name].  ----First visit with him was 01/05/2011.  ----He only saw him one other visit and that  was 04/19/2011.  Since then, patient has been seeing  Cardiology.  Had also [facility].  He had been having his PT INR checked at [facility].  However he wanted to start having it checked at our office.  He presented here on 06/19/13 for PT/INR check and also to re- establish primary care here.  COUMADIN: He is on Coumadin for history of atrial fibrillation/atrial flutter. Goal INR is 2-3. For a long time, his INR was therapeutic taking 2.5 mg on all days except for 5 mg on Mondays /  Wednesdays /  Fridays. However, recently, he has been having elevated INR and dose adjusted.  Now taking 5mg  on Mondays & Fridays. 2.5mg  all other days.  No skipped doses.  No bleeding.  No significant change in dietary intake of Vitamin K containing foods.  No change in medications.   DIABETES: My lab result note attached to labs 10/01/14. That result note stated to stop the metformin. Also stated to decrease Januvia from 100 mg to 50 mg. However, at f/u OV patient stated that he was taking metformin just one in the morning only. Said that he was off of Januvia. Said that the metformin was the only medicine he was on for diabetes..  At [place] 04/13/14--he said he was having diarrhea for months. No abdominal pain--just diarrhea.  IDecreased Metformin--At OV 06/22/14 pt says diarrhea resolved since  decreasing the dose of Metformin.  At [place] 10/27/15 he reports tingling sensation across his toes on bilateral feet. Asked if there is any medicine he can use for this pain/tingling sensation. --- Prescribed Neurontin 300 mg 1 by mouth 3 times a day at that visit.  DEPRESSION: At [place] 03/09/2014 he says that he did want to discuss one thing. Says that he has been on medications for depression in the past but then he was without insurance for a while so he had gone off of those medications. He is feeling that he does need to get back on medication for this.  Says that he does not actually have episodes of crying but says that he does feel depressed and feels like he is not really interested in doing things that he used to find pleasure in. He says that there are no unusual stressors going on in his life right now says that things are same as usual but he just doesn't feel right. Feels depressed as above. Also says that it seems to be affecting his sleep and that he will sometimes wake up in the night thinking of things. Says that [name] had him on a medication that he prescribed here but says that he doesn't think that worked very well but cannot remember the name of it. I reviewed his chart and it looks like that was Lexapro. Patient says that  he also knows in the past he has been on Wellbutrin at one point and was on Prozac at one point. He says that he remembers that the Wellbutrin worked well for him. At that OV I Rxed Wellbutrin. AT OV 04/13/14-- Pt says he is taking this as directed---now taking BID. He cant tell whether it is helping much yet or not.   At [place] 09/02/2014--he said that he thought he needed to get on Lexapro that we have discussed in the past. He said he was taking the Wellbutrin but it did not seem to be controlling his depression symptoms. Michela Pitcher that recently he had not been sleeping very well. Was waking up at about 2 or 3 AM and had hard time getting back to sleep. General did not  seem very interested in doing things. Decreased motivation. Decreased pleasure in things that he used to have pleasure with. At that visit he was interested in adding Lexapro to Wellbutrin. Added Lexapro 10 mg daily.   At prior OVs, I reviewed that the last PSA was elevated and that I referred to urology but he had deferred secondary to cost.  Finally,  at last visit we did get him scheduled with urology and discussed cost with him. At [place] with me 03/22/15--he reported to me that he had his appointment with them the following day-- December 6.  ADDENDUM: Received note from Urology dated 03/23/2015:  Their diagnosis includes: Benign localized hyperplasia of prostate with urinary obstruction Intermittent urinary stream Weak urinary stream Nocturia  Their plan includes: Complex uroflowmetry Cystoscopy Prostate ultrasound PSA reflex to free PVR ultrasound  Discussed this with pt at OV 10/27/2015.  He states that he just went for that one visit at urology and has not been able to follow-up with them since. Says that different things keep popping up and happening. Says it seems that every month something comes up. Says that he just had to buy a new battery for his vehicle and that was expensive. Says that he has very little money available after he pays his bills. Says that even to go to the urology appointment he had to pay $45 co-pay. Asked if he ever found out what his cost would be for the procedures and he says he never even found out that information and does not know.    05/24/2016: DIABETES: He does have diabetic neuropathy. He is taking gabapentin 300 mg 1 by mouth 3 times a day for this. He is on metformin. This is causing no adverse effects. He does not check his blood sugar often. Is on statin Crestor.  HYPERTENSION: He is taking blood pressure medications as directed. No lightheadedness. No lower extremity edema.  HYPERLIPIDEMIA: He is taking his Crestor as directed. No  myalgias or other adverse effects.  CAD: He presented to me with an office visit 10/14/14 with complaints consistent with angina. Subsequently was admitted to the hospital and underwent PTCA. As well he had a prior history of CAD and stenting in the past. This is currently stable with no angina symptoms. He reports that he recently saw [name] for routine follow-up and was told he can wait a year for follow-up visit with him. He is taking all cardiac medications as directed with no adverse effects. Having no angina symptoms. No lightheadedness or presyncope.  Depression: This is stable and controlled. He is taking Lexapro and Wellbutrin. These are causing no adverse effects but are controlling his depression symptoms.  INSOMNIA: He is now taking Ambien 10  mg daily at bedtime and this is working well for his insomnia.   Past Medical History:  Diagnosis Date  . Arthritis    "knees and lower back" (03/14/2013)  . Atrial flutter ([place])    radiofrequency ablation in 2001  . CAD (coronary artery disease)    a. Nonobstructive. Cardiac cath in 2001-50% mid RI, normal LM, LAD, RCA b. cath 10/16/2014 95% mid RCA treated with DES, 99% ost D1 medical management due to small aneurysmal segment  . Chronic anticoagulation    chronic Coumadin anticoagulation  . Chronic obstructive pulmonary disease ([place]) 04/20/2011  . Diabetes mellitus, type 2 ([place])   . GERD (gastroesophageal reflux disease)   . Hyperlipidemia   . Hypertension    with hypertensive heart disease  . Obesity   . Persistent atrial fibrillation (HCC)    recurrent atrial flutter since 2001 s/p DCCVs, multiple failed AADs, h/o tachy-mediated cardiomyopathy  . Shortness of breath    "can come on at any time" (03/14/2013)  . Sleep apnea    "dx'd; couldn't wear the mask" (03/14/2013)  . Tobacco abuse      Home Meds:  Outpatient Medications Prior to Visit  Medication Sig Dispense Refill  . amLODipine (NORVASC) 5 MG tablet TAKE 1 TABLET  BY MOUTH ONCE DAILY 90 tablet 1  . buPROPion (WELLBUTRIN SR) 150 MG 12 hr tablet Take 1 tablet (150 mg total) by mouth 2 (two) times daily. 180 tablet 1  . celecoxib (CELEBREX) 200 MG capsule Take 1 capsule (200 mg total) by mouth daily. 90 capsule 1  . clopidogrel (PLAVIX) 75 MG tablet TAKE 1 TABLET BY MOUTH DAILY 90 tablet 1  . cyclobenzaprine (FLEXERIL) 10 MG tablet Take 1 tablet (10 mg total) by mouth 3 (three) times daily as needed for muscle spasms. 30 tablet 0  . digoxin (LANOXIN) 0.125 MG tablet Take 1 tablet (0.125 mg total) by mouth daily. 90 tablet 2  . escitalopram (LEXAPRO) 10 MG tablet TAKE 1 TABLET BY MOUTH ONCE DAILY 30 tablet 5  . fish oil-omega-3 fatty acids 1000 MG capsule Take 1 g by mouth daily.      . fluticasone (FLONASE) 50 MCG/ACT nasal spray Place 1 spray into both nostrils daily as needed for allergies or rhinitis.    . furosemide (LASIX) 20 MG tablet TAKE 1 TABLET BY MOUTH DAILY 30 tablet 2  . gabapentin (NEURONTIN) 300 MG capsule TAKE 1 CAPSULE (300 MG TOTAL) BY MOUTH 3 (THREE) TIMES DAILY. 90 capsule 0  . glucose blood test strip 1 each by Other route daily. Check Blood Sugar fasting each morning 100 each 3  . HYDROcodone-acetaminophen (NORCO/VICODIN) 5-325 MG tablet Take 1 tablet by mouth every 6 (six) hours as needed. 30 tablet 0  . isosorbide mononitrate (IMDUR) 30 MG 24 hr tablet TAKE 1 TABLET BY MOUTH ONCE DAILY 90 tablet 1  . lisinopril (PRINIVIL,ZESTRIL) 20 MG tablet TAKE 1 TABLET BY MOUTH ONCE DAILY 30 tablet 0  . metFORMIN (GLUCOPHAGE) 500 MG tablet TAKE 1 TABLET BY MOUTH TWICE DAILY WITH A MEAL 180 tablet 0  . metoprolol succinate (TOPROL-XL) 100 MG 24 hr tablet TAKE 1 TABLET BY MOUTH TWICE DAILY ALONG WITH 50MG  TAB FOR A TOTAL DAILY DOSE OF 150MG  TWICE DAILY 180 tablet 1  . metoprolol succinate (TOPROL-XL) 50 MG 24 hr tablet TAKE 1 TABLET BY MOUTH TWICE DAILY WITH 100MG  TABLET FOR TOTAL DAILY DOSE OF 150MG  TWICE DAILY 180 tablet 1  . nitroGLYCERIN  (NITROSTAT) 0.4 MG SL tablet Place  1 tablet (0.4 mg total) under the tongue every 5 (five) minutes as needed for chest pain. 25 tablet 1  . pantoprazole (PROTONIX) 40 MG tablet TAKE 1 TABLET BY MOUTH ONCE DAILY 30 tablet 0  . promethazine (PHENERGAN) 25 MG tablet Take 1 tablet (25 mg total) by mouth every 8 (eight) hours as needed for nausea or vomiting. 20 tablet 0  . rosuvastatin (CRESTOR) 20 MG tablet TAKE 1 TABLET BY MOUTH BEFORE BEDTIME 30 tablet 0  . warfarin (COUMADIN) 5 MG tablet TAKE 1 TABLET BY MOUTH ON MONDAYS AND 1/2 TABLET ALL OTHER DAYS (Patient taking differently: Take 2.5-5 mg by mouth daily. TAKE 1 TABLET BY MOUTH ON MONDAYS AND 1/2 TABLET ALL OTHER DAYS) 90 tablet 1  . zolpidem (AMBIEN) 10 MG tablet Take 10 mg by mouth at bedtime as needed for sleep. Reported on 08/25/2015  1  . predniSONE (DELTASONE) 20 MG tablet Take 1 tablet (20 mg total) by mouth daily with breakfast. (Patient not taking: Reported on 05/24/2016) 5 tablet 0   No facility-administered medications prior to visit.      Allergies: No Known Allergies  Social History   Social History  . Marital status: Legally Separated    Spouse name: N/A  . Number of children: 1  . Years of education: N/A   Occupational History  . Unemployed   .  Unemployed   Social History Main Topics  . Smoking status: Former Smoker    Packs/day: 1.00    Years: 42.00    Types: Cigarettes    Quit date: 12/31/2011  . Smokeless tobacco: Never Used  . Alcohol use Yes     Comment: 03/14/2013 "stopped drinking back in 2002; never had problem w/it"  . Drug use: No  . Sexual activity: Not Currently   Other Topics Concern  . 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          Family History  Problem Relation Age of Onset  . Alzheimer's disease Mother   .  Osteoporosis Mother      Review of Systems:  See HPI for pertinent ROS. All other ROS negative.    Physical Exam: Blood pressure 108/78, pulse 82, temperature 97.5 F (36.4 C), temperature source Oral, resp. rate 16, weight 230 lb 3.2 oz (104.4 kg), SpO2 98 %., Body mass index is 31.22 kg/m. General: WNWD WM. Appears in no acute distress. Neck: Supple. No thyromegaly. No lymphadenopathy. No carotid bruits. Lungs: Clear bilaterally to auscultation without wheezes, rales, or rhonchi. Breathing is unlabored. Heart: Irreg rhythm. No murmur. Abdomen: Soft, non-tender, non-distended with normoactive bowel sounds. No hepatomegaly. No rebound/guarding. No obvious abdominal masses. Musculoskeletal:  Strength and tone normal for age. Extremities/Skin: Warm and dry. No clubbing or cyanosis. No edema. No rashes or suspicious lesions. Neuro: Alert and oriented X 3. Moves all extremities spontaneously. Gait is normal. CNII-XII grossly in tact. Psych:  Responds to questions appropriately with a normal affect. Diabetic foot exam: Inspection is normal.  Sensation is intact and normal.  He has no palpable posterior tibial pulses bilaterally. 1+ Left dorsalis pedis pulses. No palpable DP pulse on the Right.       ASSESSMENT AND PLAN:  66 y.o. year old male with   FINANCIAL DISTRESS---SEE # 12 AND # 13 BELOW. ---HAS  NOT BEEN ABLE TO AFFORD COPAY TO SEE GI OR UROLOGY.  FINALLY, IS GOING TO SEE UROLOGY---Appt 03/23/2015                                      ----ALSO, SEE HPI---PRIOR TO COMING TO THIS OFFICE, WENT TO FREE CLINIC AND HEALTH DEPARTMENT    -----WILL HAVE OUR STAFF SEE IF THN OR SOME OTHER ASSISTANCE CAN HELP HIM  He has Medicare secondary to Disability--cannot use savings cards with Medicare.     ADDENDUM: Received note from Urology dated 03/23/2015:  Their diagnosis includes: Benign localized hyperplasia of prostate with urinary obstruction Intermittent urinary stream Weak urinary  stream Nocturia  Their plan includes: Complex uroflowmetry Cystoscopy Prostate ultrasound PSA reflex to free PVR ultrasound  See HPI from [place] 10/27/2015 regarding update regarding this issue.   Coronary Artery Disease He underwent cardiac catheterization 03/17/2013. Was not favorable for PCI. He has had medical treatment of CAD. 10/14/14 he was admitted to the hospital with angina. He subsequently underwent PCI. 05/24/2016: This is stable/controlled-- managed by cardiology.   Chronic anticoagulation--- 05/24/2016: Recheck PT/INR now  Atrial fibrillation 05/24/2016: This is stable/controlled--  This is managed by Cardiology and also EP-- [name]  Atrial flutter  05/24/2016: This is stable/controlled-- This is managed by Cardiology and also EP-- [name] Pacemaker  05/24/2016: This is stable/controlled--  This is managed by Cardiology and also EP-- [name]  Depression 05/24/2016: This is stable and controlled. Continue Wellbutrin and Lexapro  Insomnia 05/24/2016: This is well controlled with Ambien 10 mg.  Diabetes mellitus, type 2 05/24/2016: recheck MicroAlbumin and A1C and CMET today to monitor. - Microalbumin, urine- Check A1C today  05/24/2016: He is on ACE inhibitor. 05/24/2016: He is on statin therapy. AntiCoagulants per Cardiology  Foot exam is stable. Patient states that he had an eye exam about one year ago. Is due to schedule followup and patient plans to schedule this. He was told that exam showed "no diabetic damage " At [place] 10/01/2014 he states that his eye exam is due "right now ". I wrote on his AVS to remind him to call to schedule follow-up eye exam. At [place] 03/22/15 he states that he still has not seen ophthalmologist. He is aware that he needs to do so. At [place] 05/24/2016-- He reports that he found a place where he can get an eye exam with no co-pay. He is scheduling an appointment for there.  He received Pneumovax 23 here 01/05/2011 No further pneumonia vaccine indicated  until age 10  Diabetic Neuropathy At [place] 10/27/2015---Added Neurontin 300mg  1 po TID  Hypertension 05/24/2016: At goal. On ACE inhibitor.  05/24/2016: Check labs to monitor.  Hyperlipidemia 05/24/2016: At last lab -- LFTs normal.Last Lipid Panel- Lipid Panel is excellent. Continued Crestor 20 mg. 05/24/2016: He is not fasting today so will wait to recheck FLP. Will check LFTs.  Chronic obstructive pulmonary disease 05/24/2016: Quit smoking 2013. 05/24/2016: He is on Advair. 05/24/2016: His breathing is stable on current medications.  GASTROESOPHAGEAL REFLUX DISEASE 05/24/2016: Protonix can be used with Plavix to continue this one.  Prostate cancer screening -- Elevated PSA -PSA was elevated 02/05/2014.--4.91.  I ordered referral to urology. Patient reported that he could not afford the co-pay and did not follow-up with urology.  PSA repeated 06/22/2014---4.71---I had noted for staff to see if  can find any type of assistance program to help  cover cost so that he can follow-up with urology.However, this has not happened.  AT [place] 10/01/2014--I am discussing with staff again to investigate options for financial assistance/other services, options to help him. AT [place] 03/22/2015--- he states that he has his appointment with urology tomorrow and is planning to follow-up and go to that appointment.   ADDENDUM: Received note from Urology dated 03/23/2015:  Their diagnosis includes: Benign localized hyperplasia of prostate with urinary obstruction Intermittent urinary stream Weak urinary stream Nocturia  Their plan includes: Complex uroflowmetry Cystoscopy Prostate ultrasound PSA reflex to free PVR ultrasound  See HPI from [place] 10/27/2015 regarding update regarding this issue.   Screening for colorectal cancer The following is copied form his OV on 06/26/2013:   "He reports that he has never had a colonoscopy. Says in the past he was going to -but that's when he then lost his insurance. He is agreeable to  proceed with this and is agreeable for me to do her referral for followup with GI. - Ambulatory referral to Gastroenterology "  At f/u [place] 11/06/2013 he reports: He cannot even afford the co-pay to see GI for the office visit. His co-pay for that visit was going to be $45. He does not even know what his cost would be for the colonoscopy itself. OV 10/2015 he still reports the same. Says that he has not followed up with GI.   Immunizations: He received Pneumovax 23 here in the office on 01/05/2011 No further pneumonia vaccine indicated until age 11. He received tetanus here at our office 01/05/2011 Influenza Vaccine----Given here 04/13/2014, 01/04/2015 Zostavax: So far, have not even discussed, as cost will likely be prohibitive       Follow-up here in 4 weeks for PT/INR. He will follow-up in 3 months for routine follow-up visit.    Signed, [address] [place], [place], [facility] 05/24/2016 10:32 AM

## 2016-05-24 NOTE — Telephone Encounter (Signed)
Refill appropriate 

## 2016-05-25 LAB — PROTIME-INR
INR: 3.8 — ABNORMAL HIGH
Prothrombin Time: 37.9 s — ABNORMAL HIGH (ref 9.0–11.5)

## 2016-05-25 LAB — HEMOGLOBIN A1C
Hgb A1c MFr Bld: 8.7 % — ABNORMAL HIGH (ref <5.7)
Mean Plasma Glucose: 203 mg/dL

## 2016-05-28 ENCOUNTER — Other Ambulatory Visit: Payer: Self-pay | Admitting: Physician Assistant

## 2016-05-28 DIAGNOSIS — E1142 Type 2 diabetes mellitus with diabetic polyneuropathy: Secondary | ICD-10-CM

## 2016-05-29 ENCOUNTER — Other Ambulatory Visit: Payer: Self-pay

## 2016-05-29 MED ORDER — SITAGLIPTIN PHOSPHATE 100 MG PO TABS: 100.0000 mg | ORAL_TABLET | Freq: Every day | ORAL | 3 refills | Status: DC

## 2016-05-29 NOTE — Telephone Encounter (Signed)
Refill appropriate 

## 2016-05-31 ENCOUNTER — Other Ambulatory Visit: Payer: Self-pay | Admitting: Physician Assistant

## 2016-05-31 NOTE — Telephone Encounter (Signed)
Rx filled per protocol  

## 2016-06-01 ENCOUNTER — Other Ambulatory Visit: Payer: Self-pay

## 2016-06-01 MED ORDER — ROSUVASTATIN CALCIUM 20 MG PO TABS: ORAL_TABLET | ORAL | 1 refills | Status: DC

## 2016-06-01 NOTE — Telephone Encounter (Signed)
Rx filled per protocol  

## 2016-06-07 ENCOUNTER — Ambulatory Visit: Payer: Self-pay | Admitting: Physician Assistant

## 2016-06-11 ENCOUNTER — Other Ambulatory Visit: Payer: Self-pay | Admitting: Internal Medicine

## 2016-06-14 ENCOUNTER — Other Ambulatory Visit: Payer: Self-pay | Admitting: Physician Assistant

## 2016-06-14 NOTE — Telephone Encounter (Signed)
Refill appropriate 

## 2016-06-15 ENCOUNTER — Other Ambulatory Visit: Payer: Self-pay | Admitting: Physician Assistant

## 2016-06-15 NOTE — Telephone Encounter (Signed)
Refill appropriate 

## 2016-06-18 ENCOUNTER — Other Ambulatory Visit: Payer: Self-pay | Admitting: Physician Assistant

## 2016-06-19 ENCOUNTER — Ambulatory Visit (INDEPENDENT_AMBULATORY_CARE_PROVIDER_SITE_OTHER): Payer: Medicare HMO | Admitting: Physician Assistant

## 2016-06-19 ENCOUNTER — Encounter: Payer: Self-pay | Admitting: Physician Assistant

## 2016-06-19 VITALS — BP 118/78 | HR 102 | Temp 97.4°F | Resp 16 | Wt 230.4 lb

## 2016-06-19 DIAGNOSIS — Z7901 Long term (current) use of anticoagulants: Secondary | ICD-10-CM | POA: Diagnosis not present

## 2016-06-19 LAB — PT WITH INR/FINGERSTICK
INR, fingerstick: 2.5 — ABNORMAL HIGH (ref 0.80–1.20)
PT, fingerstick: 30.6 seconds — ABNORMAL HIGH (ref 10.4–12.5)

## 2016-06-19 NOTE — Telephone Encounter (Signed)
Refill approproiate

## 2016-06-19 NOTE — Progress Notes (Signed)
Patient ID: [name] Cook MRN: [id number], DOB: June 19, 1950, 66 y.o. Date of Encounter: 06/19/2016, 12:41 PM    Chief Complaint:  Chief Complaint  Patient presents with  . Blood Sugar Problem    4wk f/u     HPI: 66 y.o. year old male here for PT/INR check.   Has been taking his Coumadin as directed. He is having no bleeding. Januvia recently added for his diabetes but otherwise no other new medicines or recent medicine changes.     Home Meds:   Outpatient Medications Prior to Visit  Medication Sig Dispense Refill  . amLODipine (NORVASC) 5 MG tablet TAKE 1 TABLET BY MOUTH ONCE DAILY 90 tablet 1  . buPROPion (WELLBUTRIN SR) 150 MG 12 hr tablet TAKE 1 TABLET (150 MG TOTAL) BY MOUTH 2 (TWO) TIMES DAILY. 180 tablet 1  . celecoxib (CELEBREX) 200 MG capsule TAKE 1 CAPSULE (200 MG TOTAL) BY MOUTH DAILY. 90 capsule 1  . clopidogrel (PLAVIX) 75 MG tablet TAKE 1 TABLET BY MOUTH DAILY 90 tablet 3  . cyclobenzaprine (FLEXERIL) 10 MG tablet Take 1 tablet (10 mg total) by mouth 3 (three) times daily as needed for muscle spasms. 30 tablet 0  . digoxin (LANOXIN) 0.125 MG tablet Take 1 tablet (0.125 mg total) by mouth daily. 90 tablet 2  . escitalopram (LEXAPRO) 10 MG tablet TAKE 1 TABLET BY MOUTH ONCE DAILY 30 tablet 0  . fish oil-omega-3 fatty acids 1000 MG capsule Take 1 g by mouth daily.      . fluticasone (FLONASE) 50 MCG/ACT nasal spray Place 1 spray into both nostrils daily as needed for allergies or rhinitis.    . fluticasone (FLONASE) 50 MCG/ACT nasal spray PLACE 2 SPRAYS INTO BOTH NOSTRILS DAILY. 16 g 6  . furosemide (LASIX) 20 MG tablet TAKE 1 TABLET BY MOUTH DAILY 30 tablet 2  . gabapentin (NEURONTIN) 300 MG capsule TAKE 1 CAPSULE (300 MG TOTAL) BY MOUTH 3 (THREE) TIMES DAILY. 90 capsule 0  . glucose blood test strip 1 each by Other route daily. Check Blood Sugar fasting each morning 100 each 3  . HYDROcodone-acetaminophen (NORCO/VICODIN) 5-325 MG tablet Take 1 tablet by mouth every 6  (six) hours as needed. 30 tablet 0  . isosorbide mononitrate (IMDUR) 30 MG 24 hr tablet TAKE 1 TABLET BY MOUTH ONCE DAILY 90 tablet 1  . lisinopril (PRINIVIL,ZESTRIL) 20 MG tablet TAKE 1 TABLET BY MOUTH ONCE DAILY 30 tablet 0  . metFORMIN (GLUCOPHAGE) 500 MG tablet TAKE 1 TABLET BY MOUTH TWICE DAILY WITH A MEAL 180 tablet 0  . metoprolol succinate (TOPROL-XL) 100 MG 24 hr tablet TAKE 1 TABLET BY MOUTH TWICE DAILY ALONG WITH 50MG  TAB FOR A TOTAL DAILY DOSE OF 150MG  TWICE DAILY 180 tablet 1  . metoprolol succinate (TOPROL-XL) 50 MG 24 hr tablet TAKE 1 TABLET BY MOUTH TWICE DAILY WITH 100MG  TABLET FOR TOTAL DAILY DOSE OF 150MG  TWICE DAILY 180 tablet 1  . nitroGLYCERIN (NITROSTAT) 0.4 MG SL tablet Place 1 tablet (0.4 mg total) under the tongue every 5 (five) minutes as needed for chest pain. 25 tablet 1  . pantoprazole (PROTONIX) 40 MG tablet TAKE 1 TABLET BY MOUTH ONCE DAILY 30 tablet 0  . predniSONE (DELTASONE) 20 MG tablet Take 1 tablet (20 mg total) by mouth daily with breakfast. 5 tablet 0  . promethazine (PHENERGAN) 25 MG tablet Take 1 tablet (25 mg total) by mouth every 8 (eight) hours as needed for nausea or vomiting. 20 tablet 0  .  rosuvastatin (CRESTOR) 20 MG tablet TAKE 1 TABLET BY MOUTH BEFORE BEDTIME 90 tablet 1  . sitaGLIPtin (JANUVIA) 100 MG tablet Take 1 tablet (100 mg total) by mouth daily. 30 tablet 3  . warfarin (COUMADIN) 5 MG tablet TAKE 1 TABLET BY MOUTH ON MONDAYS AND 1/2 TABLET ALL OTHER DAYS (Patient taking differently: Take 2.5-5 mg by mouth daily. TAKE 1 TABLET BY MOUTH ON MONDAYS AND 1/2 TABLET ALL OTHER DAYS) 90 tablet 1  . zolpidem (AMBIEN) 10 MG tablet Take 10 mg by mouth at bedtime as needed for sleep. Reported on 08/25/2015  1   No facility-administered medications prior to visit.     Allergies: No Known Allergies    Review of Systems: See HPI for pertinent ROS. All other ROS negative.    Physical Exam: Blood pressure 118/78, pulse (!) 102, temperature 97.4 F  (36.3 C), temperature source Oral, resp. rate 16, weight 230 lb 6.4 oz (104.5 kg), SpO2 97 %., Body mass index is 31.25 kg/m. General:  WNWD WM Appears in no acute distress. Neck: Supple. No thyromegaly. No lymphadenopathy. Lungs: Clear bilaterally to auscultation without wheezes, rales, or rhonchi. Breathing is unlabored. Heart: Irregular rhythm.  Msk:  Strength and tone normal for age. Extremities/Skin: Warm and dry. Neuro: Alert and oriented X 3. Moves all extremities spontaneously. Gait is normal. CNII-XII grossly in tact. Psych:  Responds to questions appropriately with a normal affect.      ASSESSMENT AND PLAN:  66 y.o. year old male with  1. Long term current use of anticoagulant - PT with INR/Fingerstick  INR therapeutic at 2.5   Continue current dose of Coumadin. Recheck PT/INR 4 weeks.     [address] [place], [place], [facility] 06/19/2016 12:41 PM

## 2016-06-21 ENCOUNTER — Ambulatory Visit: Payer: Medicare HMO | Admitting: Family Medicine

## 2016-06-21 ENCOUNTER — Ambulatory Visit: Payer: Medicare HMO | Admitting: Physician Assistant

## 2016-06-25 ENCOUNTER — Other Ambulatory Visit: Payer: Self-pay | Admitting: Physician Assistant

## 2016-06-26 ENCOUNTER — Other Ambulatory Visit: Payer: Self-pay | Admitting: Physician Assistant

## 2016-06-26 DIAGNOSIS — E1142 Type 2 diabetes mellitus with diabetic polyneuropathy: Secondary | ICD-10-CM

## 2016-06-26 NOTE — Telephone Encounter (Signed)
Refill appropriate 

## 2016-06-27 ENCOUNTER — Other Ambulatory Visit: Payer: Self-pay | Admitting: Physician Assistant

## 2016-06-27 NOTE — Telephone Encounter (Signed)
Refill appropriate 

## 2016-06-30 ENCOUNTER — Other Ambulatory Visit: Payer: Self-pay

## 2016-06-30 DIAGNOSIS — E1142 Type 2 diabetes mellitus with diabetic polyneuropathy: Secondary | ICD-10-CM

## 2016-06-30 MED ORDER — SITAGLIPTIN PHOSPHATE 100 MG PO TABS: 100.0000 mg | ORAL_TABLET | Freq: Every day | ORAL | 3 refills | Status: DC

## 2016-06-30 MED ORDER — GABAPENTIN 300 MG PO CAPS: 300.0000 mg | ORAL_CAPSULE | Freq: Three times a day (TID) | ORAL | 0 refills | Status: DC

## 2016-06-30 MED ORDER — ROSUVASTATIN CALCIUM 20 MG PO TABS: ORAL_TABLET | ORAL | 1 refills | Status: DC

## 2016-06-30 NOTE — Telephone Encounter (Signed)
Pharmacy req 90 day supply of januvia, gabapentin as well as rosuvastatin . Refill processed

## 2016-07-02 ENCOUNTER — Other Ambulatory Visit: Payer: Self-pay | Admitting: Physician Assistant

## 2016-07-03 NOTE — Telephone Encounter (Signed)
Refill appropriate 

## 2016-07-05 ENCOUNTER — Other Ambulatory Visit: Payer: Self-pay

## 2016-07-05 MED ORDER — LISINOPRIL 20 MG PO TABS: 20.0000 mg | ORAL_TABLET | Freq: Every day | ORAL | 0 refills | Status: DC

## 2016-07-05 NOTE — Telephone Encounter (Signed)
Refill appropriate 

## 2016-07-19 ENCOUNTER — Other Ambulatory Visit: Payer: Self-pay | Admitting: Physician Assistant

## 2016-07-19 NOTE — Telephone Encounter (Signed)
Medication refilled per protocol. 

## 2016-07-20 ENCOUNTER — Ambulatory Visit (INDEPENDENT_AMBULATORY_CARE_PROVIDER_SITE_OTHER): Payer: Medicare HMO | Admitting: Physician Assistant

## 2016-07-20 ENCOUNTER — Encounter: Payer: Self-pay | Admitting: Physician Assistant

## 2016-07-20 VITALS — BP 110/82 | HR 76 | Temp 97.5°F | Resp 16 | Wt 228.4 lb

## 2016-07-20 DIAGNOSIS — Z7901 Long term (current) use of anticoagulants: Secondary | ICD-10-CM

## 2016-07-20 LAB — PT WITH INR/FINGERSTICK
INR, fingerstick: 2.7 — ABNORMAL HIGH (ref 0.9–1.1)
PT, fingerstick: 32 seconds — ABNORMAL HIGH (ref 10.5–13.1)

## 2016-07-20 NOTE — Progress Notes (Signed)
Patient ID: [name] Cook MRN: [id number], DOB: 28-Nov-1950, 66 y.o. Date of Encounter: 07/20/2016, 10:22 AM    Chief Complaint:  Chief Complaint  Patient presents with  . Anticoagulation    f/u     HPI: 66 y.o. year old male here for PT/INR check.   Has been taking his Coumadin as directed. He is having no bleeding. No new medicines or recent medicine changes.     Home Meds:   Outpatient Medications Prior to Visit  Medication Sig Dispense Refill  . amLODipine (NORVASC) 5 MG tablet TAKE 1 TABLET BY MOUTH ONCE DAILY 90 tablet 0  . buPROPion (WELLBUTRIN SR) 150 MG 12 hr tablet TAKE 1 TABLET (150 MG TOTAL) BY MOUTH 2 (TWO) TIMES DAILY. 180 tablet 1  . celecoxib (CELEBREX) 200 MG capsule TAKE 1 CAPSULE (200 MG TOTAL) BY MOUTH DAILY. 90 capsule 1  . clopidogrel (PLAVIX) 75 MG tablet TAKE 1 TABLET BY MOUTH DAILY 90 tablet 3  . cyclobenzaprine (FLEXERIL) 10 MG tablet Take 1 tablet (10 mg total) by mouth 3 (three) times daily as needed for muscle spasms. 30 tablet 0  . digoxin (LANOXIN) 0.125 MG tablet Take 1 tablet (0.125 mg total) by mouth daily. 90 tablet 2  . escitalopram (LEXAPRO) 10 MG tablet TAKE 1 TABLET BY MOUTH ONCE DAILY 30 tablet 0  . fish oil-omega-3 fatty acids 1000 MG capsule Take 1 g by mouth daily.      . fluticasone (FLONASE) 50 MCG/ACT nasal spray Place 1 spray into both nostrils daily as needed for allergies or rhinitis.    . furosemide (LASIX) 20 MG tablet TAKE 1 TABLET BY MOUTH DAILY 30 tablet 2  . gabapentin (NEURONTIN) 300 MG capsule Take 1 capsule (300 mg total) by mouth 3 (three) times daily. 90 capsule 0  . glucose blood test strip 1 each by Other route daily. Check Blood Sugar fasting each morning 100 each 3  . HYDROcodone-acetaminophen (NORCO/VICODIN) 5-325 MG tablet Take 1 tablet by mouth every 6 (six) hours as needed. 30 tablet 0  . isosorbide mononitrate (IMDUR) 30 MG 24 hr tablet TAKE 1 TABLET BY MOUTH ONCE DAILY 90 tablet 1  . lisinopril  (PRINIVIL,ZESTRIL) 20 MG tablet Take 1 tablet (20 mg total) by mouth daily. 90 tablet 0  . metFORMIN (GLUCOPHAGE) 500 MG tablet TAKE 1 TABLET BY MOUTH TWICE DAILY WITH A MEAL 180 tablet 0  . metoprolol succinate (TOPROL-XL) 100 MG 24 hr tablet TAKE 1 TABLET BY MOUTH TWICE DAILY ALONG WITH 50MG  TAB FOR A TOTAL DAILY DOSE OF 150MG  TWICE DAILY 180 tablet 1  . metoprolol succinate (TOPROL-XL) 50 MG 24 hr tablet TAKE 1 TABLET BY MOUTH TWICE DAILY WITH 100MG  TABLET FOR TOTAL DAILY DOSE OF 150MG  TWICE DAILY 180 tablet 1  . nitroGLYCERIN (NITROSTAT) 0.4 MG SL tablet Place 1 tablet (0.4 mg total) under the tongue every 5 (five) minutes as needed for chest pain. 25 tablet 1  . pantoprazole (PROTONIX) 40 MG tablet TAKE 1 TABLET BY MOUTH ONCE DAILY 90 tablet 3  . predniSONE (DELTASONE) 20 MG tablet Take 1 tablet (20 mg total) by mouth daily with breakfast. 5 tablet 0  . promethazine (PHENERGAN) 25 MG tablet Take 1 tablet (25 mg total) by mouth every 8 (eight) hours as needed for nausea or vomiting. 20 tablet 0  . rosuvastatin (CRESTOR) 20 MG tablet TAKE 1 TABLET BY MOUTH BEFORE BEDTIME 90 tablet 1  . sitaGLIPtin (JANUVIA) 100 MG tablet Take 1 tablet (100  mg total) by mouth daily. 90 tablet 3  . warfarin (COUMADIN) 5 MG tablet TAKE 1 TABLET BY MOUTH ON MONDAYS AND 1/2 TABLET ALL OTHER DAYS (Patient taking differently: Take 2.5-5 mg by mouth daily. TAKE 1 TABLET BY MOUTH ON MONDAYS AND 1/2 TABLET ALL OTHER DAYS) 90 tablet 1  . zolpidem (AMBIEN) 10 MG tablet Take 10 mg by mouth at bedtime as needed for sleep. Reported on 08/25/2015  1  . fluticasone (FLONASE) 50 MCG/ACT nasal spray PLACE 2 SPRAYS INTO BOTH NOSTRILS DAILY. 16 g 6   No facility-administered medications prior to visit.     Allergies: No Known Allergies    Review of Systems: See HPI for pertinent ROS. All other ROS negative.    Physical Exam: Blood pressure 110/82, pulse 76, temperature 97.5 F (36.4 C), temperature source Oral, resp. rate 16,  weight 228 lb 6.4 oz (103.6 kg), SpO2 98 %., Body mass index is 30.98 kg/m. General:  WNWD WM Appears in no acute distress. Neck: Supple. No thyromegaly. No lymphadenopathy. Lungs: Clear bilaterally to auscultation without wheezes, rales, or rhonchi. Breathing is unlabored. Heart: Irregular rhythm.  Msk:  Strength and tone normal for age. Extremities/Skin: Warm and dry. Neuro: Alert and oriented X 3. Moves all extremities spontaneously. Gait is normal. CNII-XII grossly in tact. Psych:  Responds to questions appropriately with a normal affect.      ASSESSMENT AND PLAN:  66 y.o. year old male with  1. Long term current use of anticoagulant - PT with INR/Fingerstick  INR therapeutic at 2.7   Continue current dose of Coumadin. Recheck PT/INR 4 weeks.     Signed, [address] [place], [place], [facility] 07/20/2016 10:22 AM

## 2016-07-27 ENCOUNTER — Other Ambulatory Visit: Payer: Self-pay | Admitting: Physician Assistant

## 2016-07-28 NOTE — Telephone Encounter (Signed)
Medication refilled per protocol. 

## 2016-07-29 ENCOUNTER — Other Ambulatory Visit: Payer: Self-pay | Admitting: Physician Assistant

## 2016-07-31 NOTE — Telephone Encounter (Signed)
Refill appropriate 

## 2016-08-09 ENCOUNTER — Other Ambulatory Visit: Payer: Self-pay

## 2016-08-09 ENCOUNTER — Ambulatory Visit (INDEPENDENT_AMBULATORY_CARE_PROVIDER_SITE_OTHER): Payer: Medicare HMO | Admitting: *Deleted

## 2016-08-09 ENCOUNTER — Telehealth: Payer: Self-pay | Admitting: Cardiology

## 2016-08-09 DIAGNOSIS — I495 Sick sinus syndrome: Secondary | ICD-10-CM | POA: Diagnosis not present

## 2016-08-09 DIAGNOSIS — E1142 Type 2 diabetes mellitus with diabetic polyneuropathy: Secondary | ICD-10-CM

## 2016-08-09 MED ORDER — GABAPENTIN 300 MG PO CAPS: 300.0000 mg | ORAL_CAPSULE | Freq: Three times a day (TID) | ORAL | 0 refills | Status: DC

## 2016-08-09 NOTE — Telephone Encounter (Signed)
LMOVM reminding pt to send remote transmission.   

## 2016-08-10 LAB — CUP PACEART REMOTE DEVICE CHECK
Battery Impedance: 157 Ohm
Battery Remaining Longevity: 152 mo
Battery Voltage: 2.79 V
Brady Statistic RV Percent Paced: 100 %
Date Time Interrogation Session: 20180425165501
Implantable Lead Implant Date: 20141010
Implantable Lead Implant Date: 20141010
Implantable Lead Location: 753859
Implantable Lead Location: 753860
Implantable Lead Model: 5076
Implantable Lead Model: 5076
Implantable Pulse Generator Implant Date: 20141010
Lead Channel Impedance Value: 67 Ohm
Lead Channel Impedance Value: 692 Ohm
Lead Channel Pacing Threshold Amplitude: 0.875 V
Lead Channel Pacing Threshold Pulse Width: 0.4 ms
Lead Channel Setting Pacing Amplitude: 2 V
Lead Channel Setting Pacing Pulse Width: 0.4 ms
Lead Channel Setting Sensing Sensitivity: 4 mV

## 2016-08-10 NOTE — Progress Notes (Signed)
Remote pacemaker transmission.   

## 2016-08-11 ENCOUNTER — Encounter: Payer: Self-pay | Admitting: Cardiology

## 2016-08-13 ENCOUNTER — Other Ambulatory Visit: Payer: Self-pay | Admitting: Physician Assistant

## 2016-08-14 NOTE — Telephone Encounter (Signed)
Refill appropriate 

## 2016-08-21 ENCOUNTER — Ambulatory Visit (INDEPENDENT_AMBULATORY_CARE_PROVIDER_SITE_OTHER): Payer: Medicare HMO | Admitting: Physician Assistant

## 2016-08-21 ENCOUNTER — Encounter: Payer: Self-pay | Admitting: Physician Assistant

## 2016-08-21 VITALS — BP 110/82 | HR 89 | Temp 97.4°F | Resp 16 | Wt 229.6 lb

## 2016-08-21 DIAGNOSIS — I2510 Atherosclerotic heart disease of native coronary artery without angina pectoris: Secondary | ICD-10-CM

## 2016-08-21 DIAGNOSIS — E119 Type 2 diabetes mellitus without complications: Secondary | ICD-10-CM | POA: Diagnosis not present

## 2016-08-21 DIAGNOSIS — J439 Emphysema, unspecified: Secondary | ICD-10-CM | POA: Diagnosis not present

## 2016-08-21 DIAGNOSIS — Z7901 Long term (current) use of anticoagulants: Secondary | ICD-10-CM | POA: Diagnosis not present

## 2016-08-21 DIAGNOSIS — I481 Persistent atrial fibrillation: Secondary | ICD-10-CM | POA: Diagnosis not present

## 2016-08-21 DIAGNOSIS — E785 Hyperlipidemia, unspecified: Secondary | ICD-10-CM | POA: Diagnosis not present

## 2016-08-21 DIAGNOSIS — I10 Essential (primary) hypertension: Secondary | ICD-10-CM | POA: Diagnosis not present

## 2016-08-21 DIAGNOSIS — I4819 Other persistent atrial fibrillation: Secondary | ICD-10-CM

## 2016-08-21 DIAGNOSIS — N182 Chronic kidney disease, stage 2 (mild): Secondary | ICD-10-CM | POA: Diagnosis not present

## 2016-08-21 LAB — HEMOGLOBIN A1C, FINGERSTICK: Hgb A1C (fingerstick): 7.1 % — ABNORMAL HIGH (ref <5.7)

## 2016-08-21 LAB — PT WITH INR/FINGERSTICK
INR, fingerstick: 2 — ABNORMAL HIGH (ref 0.9–1.1)
PT, fingerstick: 23.9 seconds — ABNORMAL HIGH (ref 10.5–13.1)

## 2016-08-21 NOTE — Progress Notes (Signed)
Patient ID: [name] Cook MRN: [id number], DOB: 08/03/1950, 66 y.o. Date of Encounter: @DATE @  Chief Complaint:  Chief Complaint  Patient presents with  . Hyperlipidemia    F/U  . Hypertension    HPI: 66 y.o. year old white male  presents for  Routine followup office visit to f/u Diabetes, etc. Also Here to check his PT/INR.    He had his first office visit with me on 06/19/2013.   He had been seen in our office in the past by [name].  ----First visit with him was 01/05/2011.  ----He only saw him one other visit and that  was 04/19/2011.  Since then, patient has been seeing [facility].  [facility].  He had been having his PT INR checked at [facility].  However he wanted to start having it checked at our office.  He presented here on 06/19/13 for PT/INR check and also to re- establish primary care here.  COUMADIN: He is on Coumadin for history of atrial fibrillation/atrial flutter. Goal INR is 2-3. For a long time, his INR was therapeutic taking 2.5 mg on all days except for 5 mg on Mondays /  Wednesdays /  Fridays. However, recently, he has been having elevated INR and dose adjusted.  Now taking 5mg  on Mondays & Fridays. 2.5mg  all other days.  No skipped doses.  No bleeding.  No significant change in dietary intake of Vitamin K containing foods.  No change in medications.   DIABETES: My lab result note attached to labs 10/01/14. That result note stated to stop the metformin. Also stated to decrease Januvia from 100 mg to 50 mg. However, at f/u OV patient stated that he was taking metformin just one in the morning only. Said that he was off of Januvia. Said that the metformin was the only medicine he was on for diabetes..  At [place] 04/13/14--he said he was having diarrhea for months. No abdominal pain--just diarrhea.  IDecreased Metformin--At OV 06/22/14 pt says diarrhea resolved since decreasing the  dose of Metformin.  At [place] 10/27/15 he reports tingling sensation across his toes on bilateral feet. Asked if there is any medicine he can use for this pain/tingling sensation. --- Prescribed Neurontin 300 mg 1 by mouth 3 times a day at that visit.  DEPRESSION: At [place] 03/09/2014 he says that he did want to discuss one thing. Says that he has been on medications for depression in the past but then he was without insurance for a while so he had gone off of those medications. He is feeling that he does need to get back on medication for this.  Says that he does not actually have episodes of crying but says that he does feel depressed and feels like he is not really interested in doing things that he used to find pleasure in. He says that there are no unusual stressors going on in his life right now says that things are same as usual but he just doesn't feel right. Feels depressed as above. Also says that it seems to be affecting his sleep and that he will sometimes wake up in the night thinking of things. Says that [name] had him on a medication that he prescribed here but says that he doesn't think that worked very well but cannot remember the name of it. I reviewed his chart and it looks like that was Lexapro. Patient says that he  also knows in the past he has been on Wellbutrin at one point and was on Prozac at one point. He says that he remembers that the Wellbutrin worked well for him. At that OV I Rxed Wellbutrin. AT [place] 04/13/14-- Pt says he is taking this as directed---now taking BID. He cant tell whether it is helping much yet or not.   At [place] 09/02/2014--he said that he thought he needed to get on Lexapro that we have discussed in the past. He said he was taking the Wellbutrin but it did not seem to be controlling his depression symptoms. Michela Pitcher that recently he had not been sleeping very well. Was waking up at about 2 or 3 AM and had hard time getting back to sleep. General did not seem very  interested in doing things. Decreased motivation. Decreased pleasure in things that he used to have pleasure with. At that visit he was interested in adding Lexapro to Wellbutrin. Added Lexapro 10 mg daily.   At prior OVs, I reviewed that the last PSA was elevated and that I referred to urology but he had deferred secondary to cost.  Finally,  at last visit we did get him scheduled with urology and discussed cost with him. At [place] with me 03/22/15--he reported to me that he had his appointment with them the following day-- December 6.  ADDENDUM: Received note from Urology dated 03/23/2015:  Their diagnosis includes: Benign localized hyperplasia of prostate with urinary obstruction Intermittent urinary stream Weak urinary stream Nocturia  Their plan includes: Complex uroflowmetry Cystoscopy Prostate ultrasound PSA reflex to free PVR ultrasound  Discussed this with pt at OV 10/27/2015.  He states that he just went for that one visit at urology and has not been able to follow-up with them since. Says that different things keep popping up and happening. Says it seems that every month something comes up. Says that he just had to buy a new battery for his vehicle and that was expensive. Says that he has very little money available after he pays his bills. Says that even to go to the urology appointment he had to pay $45 co-pay. Asked if he ever found out what his cost would be for the procedures and he says he never even found out that information and does not know.    08/21/2016: DIABETES: He does have diabetic neuropathy. He is taking gabapentin 300 mg 1 by mouth 3 times a day for this. He is on metformin. This is causing no adverse effects. He does not check his blood sugar often. Is on statin Crestor. Reviewed that at last visit he told me he had found somewhere he could get an eye exam. I asked if he had had that eye exam. He states that he's got to call and get that information again from  [facility]. Says that he has lost the paper. Says that "there are two young uns in the house and doesn't know what happened with the paper" and has to call [employer] back. Encouraged him to go ahead and f/u on this  HYPERTENSION: He is taking blood pressure medications as directed. No lightheadedness. No lower extremity edema.  HYPERLIPIDEMIA: He is taking his Crestor as directed. No myalgias or other adverse effects.  CAD: He presented to me with an office visit 10/14/14 with complaints consistent with angina. Subsequently was admitted to the hospital and underwent PTCA. As well he had a prior history of CAD and stenting in the past. This is currently stable with  no angina symptoms. He reports that he recently saw [name] for routine follow-up and was told he can wait a year for follow-up visit with him. He is taking all cardiac medications as directed with no adverse effects. Having no angina symptoms. No lightheadedness or presyncope.  Depression: This is stable and controlled. He is taking Lexapro and Wellbutrin. These are causing no adverse effects but are controlling his depression symptoms.  INSOMNIA: He is now taking Ambien 10 mg daily at bedtime and this is working well for his insomnia.  Anticoagulation: He is taking Coumadin as directed. No skipped doses. No bleeding.  -------------------------DUE FOR MICROALBUMIN---OBTAIN THIS AT NEXT OV-----------------------------------------------------------------  Past Medical History:  Diagnosis Date  . Arthritis    "knees and lower back" (03/14/2013)  . Atrial flutter ([place])    radiofrequency ablation in 2001  . CAD (coronary artery disease)    a. Nonobstructive. Cardiac cath in 2001-50% mid RI, normal LM, LAD, RCA b. cath 10/16/2014 95% mid RCA treated with DES, 99% ost D1 medical management due to small aneurysmal segment  . Chronic anticoagulation    chronic Coumadin anticoagulation  . Chronic obstructive pulmonary disease ([place])  04/20/2011  . Diabetes mellitus, type 2 ([place])   . GERD (gastroesophageal reflux disease)   . Hyperlipidemia   . Hypertension    with hypertensive heart disease  . Obesity   . Persistent atrial fibrillation (HCC)    recurrent atrial flutter since 2001 s/p DCCVs, multiple failed AADs, h/o tachy-mediated cardiomyopathy  . Shortness of breath    "can come on at any time" (03/14/2013)  . Sleep apnea    "dx'd; couldn't wear the mask" (03/14/2013)  . Tobacco abuse      Home Meds:  Outpatient Medications Prior to Visit  Medication Sig Dispense Refill  . amLODipine (NORVASC) 5 MG tablet TAKE 1 TABLET BY MOUTH ONCE DAILY 90 tablet 0  . buPROPion (WELLBUTRIN SR) 150 MG 12 hr tablet TAKE 1 TABLET (150 MG TOTAL) BY MOUTH 2 (TWO) TIMES DAILY. 180 tablet 1  . celecoxib (CELEBREX) 200 MG capsule TAKE 1 CAPSULE (200 MG TOTAL) BY MOUTH DAILY. 90 capsule 1  . clopidogrel (PLAVIX) 75 MG tablet TAKE 1 TABLET BY MOUTH DAILY 90 tablet 3  . cyclobenzaprine (FLEXERIL) 10 MG tablet Take 1 tablet (10 mg total) by mouth 3 (three) times daily as needed for muscle spasms. 30 tablet 0  . digoxin (LANOXIN) 0.125 MG tablet Take 1 tablet (0.125 mg total) by mouth daily. 90 tablet 2  . escitalopram (LEXAPRO) 10 MG tablet TAKE 1 TABLET BY MOUTH ONCE DAILY 90 tablet 1  . fish oil-omega-3 fatty acids 1000 MG capsule Take 1 g by mouth daily.      . fluticasone (FLONASE) 50 MCG/ACT nasal spray Place 1 spray into both nostrils daily as needed for allergies or rhinitis.    . furosemide (LASIX) 20 MG tablet TAKE 1 TABLET BY MOUTH DAILY 90 tablet 2  . gabapentin (NEURONTIN) 300 MG capsule Take 1 capsule (300 mg total) by mouth 3 (three) times daily. 270 capsule 0  . glucose blood test strip 1 each by Other route daily. Check Blood Sugar fasting each morning 100 each 3  . HYDROcodone-acetaminophen (NORCO/VICODIN) 5-325 MG tablet Take 1 tablet by mouth every 6 (six) hours as needed. 30 tablet 0  . isosorbide mononitrate (IMDUR) 30  MG 24 hr tablet TAKE 1 TABLET BY MOUTH ONCE DAILY 90 tablet 1  . lisinopril (PRINIVIL,ZESTRIL) 20 MG tablet Take 1 tablet (  20 mg total) by mouth daily. 90 tablet 0  . metFORMIN (GLUCOPHAGE) 500 MG tablet TAKE 1 TABLET BY MOUTH TWICE DAILY WITH A MEAL 180 tablet 0  . metoprolol succinate (TOPROL-XL) 100 MG 24 hr tablet TAKE 1 TABLET BY MOUTH TWICE DAILY ALONG WITH 50MG  TAB FOR A TOTAL DAILY DOSE OF 150MG  TWICE DAILY 180 tablet 1  . metoprolol succinate (TOPROL-XL) 50 MG 24 hr tablet TAKE 1 TABLET BY MOUTH TWICE DAILY WITH 100MG  TABLET FOR TOTAL DAILY DOSE OF 150MG  TWICE DAILY 180 tablet 1  . nitroGLYCERIN (NITROSTAT) 0.4 MG SL tablet Place 1 tablet (0.4 mg total) under the tongue every 5 (five) minutes as needed for chest pain. 25 tablet 1  . pantoprazole (PROTONIX) 40 MG tablet TAKE 1 TABLET BY MOUTH ONCE DAILY 90 tablet 3  . predniSONE (DELTASONE) 20 MG tablet Take 1 tablet (20 mg total) by mouth daily with breakfast. 5 tablet 0  . promethazine (PHENERGAN) 25 MG tablet Take 1 tablet (25 mg total) by mouth every 8 (eight) hours as needed for nausea or vomiting. 20 tablet 0  . rosuvastatin (CRESTOR) 20 MG tablet TAKE 1 TABLET BY MOUTH BEFORE BEDTIME 90 tablet 1  . sitaGLIPtin (JANUVIA) 100 MG tablet Take 1 tablet (100 mg total) by mouth daily. 90 tablet 3  . warfarin (COUMADIN) 5 MG tablet TAKE 1 TABLET BY MOUTH ON MONDAYS AND 1/2 TABLET ALL OTHER DAYS (Patient taking differently: Take 2.5-5 mg by mouth daily. TAKE 1 TABLET BY MOUTH ON MONDAYS AND 1/2 TABLET ALL OTHER DAYS) 90 tablet 1  . zolpidem (AMBIEN) 10 MG tablet Take 10 mg by mouth at bedtime as needed for sleep. Reported on 08/25/2015  1   No facility-administered medications prior to visit.      Allergies: No Known Allergies  Social History   Social History  . Marital status: Legally Separated    Spouse name: N/A  . Number of children: 1  . Years of education: N/A   Occupational History  . Unemployed   .  Unemployed   Social  History Main Topics  . Smoking status: Former Smoker    Packs/day: 1.00    Years: 42.00    Types: Cigarettes    Quit date: 12/31/2011  . Smokeless tobacco: Never Used  . Alcohol use Yes     Comment: 03/14/2013 "stopped drinking back in 2002; never had problem w/it"  . Drug use: No  . Sexual activity: Not Currently   Other Topics Concern  . 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          Family History  Problem Relation Age of Onset  . Alzheimer's disease Mother   . Osteoporosis Mother      Review of Systems:  See HPI for pertinent ROS. All other ROS negative.    Physical Exam: Blood pressure 110/82, pulse 89, temperature 97.4 F (36.3 C), temperature source Oral, resp. rate 16, weight 229 lb 9.6 oz (104.1 kg), SpO2 98 %., Body mass index is 31.14 kg/m. General: WNWD WM. Appears in no acute distress. Neck: Supple. No thyromegaly. No lymphadenopathy. No carotid bruits. Lungs: Clear bilaterally to auscultation without wheezes, rales, or rhonchi. Breathing is unlabored. Heart: Irreg rhythm. No murmur. Abdomen: Soft, non-tender, non-distended with normoactive bowel sounds. No hepatomegaly. No  rebound/guarding. No obvious abdominal masses. Musculoskeletal:  Strength and tone normal for age. Extremities/Skin: Warm and dry. No clubbing or cyanosis. No edema. No rashes or suspicious lesions. Neuro: Alert and oriented X 3. Moves all extremities spontaneously. Gait is normal. CNII-XII grossly in tact. Psych:  Responds to questions appropriately with a normal affect. Diabetic foot exam: Inspection is normal.  Sensation is intact and normal.  He has no palpable posterior tibial pulses bilaterally. 1+ Left dorsalis pedis pulses. No palpable DP pulse on the Right.       ASSESSMENT AND PLAN:  66 y.o. year old  male with   FINANCIAL DISTRESS---SEE # 12 AND # 13 BELOW. ---HAS NOT BEEN ABLE TO AFFORD COPAY TO SEE GI OR UROLOGY.  FINALLY, IS GOING TO SEE UROLOGY---Appt 03/23/2015                                      ----ALSO, SEE HPI---PRIOR TO COMING TO THIS OFFICE, WENT TO FREE CLINIC AND HEALTH DEPARTMENT    -----WILL HAVE OUR STAFF SEE IF THN OR SOME OTHER ASSISTANCE CAN HELP HIM  He has Medicare secondary to Disability--cannot use savings cards with Medicare.     ADDENDUM: Received note from Urology dated 03/23/2015:  Their diagnosis includes: Benign localized hyperplasia of prostate with urinary obstruction Intermittent urinary stream Weak urinary stream Nocturia  Their plan includes: Complex uroflowmetry Cystoscopy Prostate ultrasound PSA reflex to free PVR ultrasound  See HPI from [place] 10/27/2015 regarding update regarding this issue.   Coronary Artery Disease He underwent cardiac catheterization 03/17/2013. Was not favorable for PCI. He has had medical treatment of CAD. 10/14/14 he was admitted to the hospital with angina. He subsequently underwent PCI. 05/24/2016: This is stable/controlled-- managed by cardiology. 08/21/2016:This is stable/controlled-- managed by cardiology.  Chronic anticoagulation--- 05/24/2016: Recheck PT/INR now 08/21/2016---INR---2.0--- continue current dosing of Coumadin. Recheck PT/INR 4 weeks.  Atrial fibrillation 08/21/2016: This is stable/controlled--  This is managed by Cardiology and also EP-- [name]  Atrial flutter  08/21/2016: This is stable/controlled-- This is managed by Cardiology and also EP-- [name] Pacemaker  08/21/2016: This is stable/controlled--  This is managed by Cardiology and also EP-- [name]  Depression 08/21/2016: This is stable and controlled. Continue Wellbutrin and Lexapro  Insomnia 08/21/2016: This is well controlled with Ambien 10 mg.  Diabetes mellitus, type 2 -------------------------DUE FOR MICROALBUMIN---OBTAIN THIS AT  NEXT OV-----------------------------------------------------------------  08/21/2016:  Check A1C today  08/21/2016: He is on ACE inhibitor. 08/21/2016: He is on statin therapy. AntiCoagulants per Cardiology  Foot exam is stable. Patient states that he had an eye exam about one year ago. Is due to schedule followup and patient plans to schedule this. He was told that exam showed "no diabetic damage " At [place] 10/01/2014 he states that his eye exam is due "right now ". I wrote on his AVS to remind him to call to schedule follow-up eye exam. At [place] 03/22/15 he states that he still has not seen ophthalmologist. He is aware that he needs to do so. At [place] 05/24/2016-- He reports that he found a place where he can get an eye exam with no co-pay. He is scheduling an appointment for there. At [place] 08/21/2016----Reviewed that at last visit he told me he had found somewhere he could get an eye exam. I asked if he had had that eye exam. He states that he's got to call and  get that information again from [facility]. Says that he has lost the paper. Says that "there are two young uns in the house and doesn't know what happened with the paper" and has to call [employer] back. Encouraged him to go ahead and f/u on this  He received Pneumovax 23 here 01/05/2011 No further pneumonia vaccine indicated until age 54  Diabetic Neuropathy At OV 10/27/2015---Added Neurontin 300mg  1 po TID  Hypertension 08/21/2016: At goal. On ACE inhibitor.  08/21/2016: Check labs to monitor.  Hyperlipidemia 08/21/2016: At last lab -- LFTs normal.Last Lipid Panel- Lipid Panel is excellent. Continued Crestor 20 mg. 08/21/2016: He is not fasting today so will wait to recheck FLP. Will check LFTs.  Chronic obstructive pulmonary disease 08/21/2016: Quit smoking 2013. 08/21/2016: He is on Advair. 08/21/2016: His breathing is stable on current medications.  GASTROESOPHAGEAL REFLUX DISEASE 08/21/2016: Protonix can be used with Plavix to continue this one.  Prostate  cancer screening -- Elevated PSA -PSA was elevated 02/05/2014.--4.91.  I ordered referral to urology. Patient reported that he could not afford the co-pay and did not follow-up with urology.  PSA repeated 06/22/2014---4.71---I had noted for staff to see if  can find any type of assistance program to help cover cost so that he can follow-up with urology.However, this has not happened.  AT [place] 10/01/2014--I am discussing with staff again to investigate options for financial assistance/other services, options to help him. AT [place] 03/22/2015--- he states that he has his appointment with urology tomorrow and is planning to follow-up and go to that appointment.   ADDENDUM: Received note from Urology dated 03/23/2015:  Their diagnosis includes: Benign localized hyperplasia of prostate with urinary obstruction Intermittent urinary stream Weak urinary stream Nocturia  Their plan includes: Complex uroflowmetry Cystoscopy Prostate ultrasound PSA reflex to free PVR ultrasound  See HPI from [place] 10/27/2015 regarding update regarding this issue.   Screening for colorectal cancer The following is copied form his OV on 06/26/2013:   "He reports that he has never had a colonoscopy. Says in the past he was going to -but that's when he then lost his insurance. He is agreeable to proceed with this and is agreeable for me to do her referral for followup with GI. - Ambulatory referral to Gastroenterology "  At f/u OV 11/06/2013 he reports: He cannot even afford the co-pay to see GI for the office visit. His co-pay for that visit was going to be $45. He does not even know what his cost would be for the colonoscopy itself. OV 10/2015 he still reports the same. Says that he has not followed up with GI.   Immunizations: He received Pneumovax 23 here in the office on 01/05/2011 No further pneumonia vaccine indicated until age 21. He received tetanus here at our office 01/05/2011 Influenza Vaccine----Given here 04/13/2014,  01/04/2015 Zostavax: So far, have not even discussed, as cost will likely be prohibitive       Follow-up here in 4 weeks for PT/INR. He will follow-up in 3 months for routine follow-up visit.    Signed, [address] [place], [place], [facility] 08/21/2016 10:23 AM

## 2016-08-22 ENCOUNTER — Other Ambulatory Visit: Payer: Self-pay

## 2016-08-22 MED ORDER — SITAGLIPTIN PHOSPHATE 100 MG PO TABS: 100.0000 mg | ORAL_TABLET | Freq: Every day | ORAL | 3 refills | Status: DC

## 2016-08-22 MED ORDER — EMPAGLIFLOZIN 10 MG PO TABS: 10.0000 mg | ORAL_TABLET | ORAL | 5 refills | Status: DC

## 2016-08-22 NOTE — Telephone Encounter (Signed)
Refill appropriate 

## 2016-08-25 ENCOUNTER — Encounter: Payer: Self-pay | Admitting: Cardiology

## 2016-09-21 ENCOUNTER — Other Ambulatory Visit: Payer: Self-pay | Admitting: Physician Assistant

## 2016-09-21 NOTE — Telephone Encounter (Signed)
Refill appropriate 

## 2016-09-25 ENCOUNTER — Ambulatory Visit (INDEPENDENT_AMBULATORY_CARE_PROVIDER_SITE_OTHER): Payer: Medicare HMO | Admitting: Physician Assistant

## 2016-09-25 ENCOUNTER — Encounter: Payer: Self-pay | Admitting: Physician Assistant

## 2016-09-25 VITALS — BP 118/84 | HR 90 | Temp 97.4°F | Resp 16 | Wt 225.0 lb

## 2016-09-25 DIAGNOSIS — Z7901 Long term (current) use of anticoagulants: Secondary | ICD-10-CM

## 2016-09-25 LAB — PT WITH INR/FINGERSTICK
INR, fingerstick: 2.4 — ABNORMAL HIGH (ref 0.9–1.1)
PT, fingerstick: 28.8 seconds — ABNORMAL HIGH (ref 10.5–13.1)

## 2016-09-25 NOTE — Progress Notes (Signed)
Patient ID: LORRAINE TERRIQUEZ MRN: [id number], DOB: 1950/12/30, 66 y.o. Date of Encounter: 09/25/2016, 11:49 AM    Chief Complaint:  Chief Complaint  Patient presents with  . INR check     HPI: 66 y.o. year old male here for PT/INR check.   Has been taking his Coumadin as directed. He is having no bleeding. No new medicines or recent medicine changes.     Home Meds:   Outpatient Medications Prior to Visit  Medication Sig Dispense Refill  . amLODipine (NORVASC) 5 MG tablet TAKE 1 TABLET BY MOUTH ONCE DAILY 90 tablet 0  . buPROPion (WELLBUTRIN SR) 150 MG 12 hr tablet TAKE 1 TABLET (150 MG TOTAL) BY MOUTH 2 (TWO) TIMES DAILY. 180 tablet 1  . celecoxib (CELEBREX) 200 MG capsule TAKE 1 CAPSULE (200 MG TOTAL) BY MOUTH DAILY. 90 capsule 1  . clopidogrel (PLAVIX) 75 MG tablet TAKE 1 TABLET BY MOUTH DAILY 90 tablet 3  . cyclobenzaprine (FLEXERIL) 10 MG tablet Take 1 tablet (10 mg total) by mouth 3 (three) times daily as needed for muscle spasms. 30 tablet 0  . digoxin (LANOXIN) 0.125 MG tablet Take 1 tablet (0.125 mg total) by mouth daily. 90 tablet 2  . empagliflozin (JARDIANCE) 10 MG TABS tablet Take 10 mg by mouth every morning. 30 tablet 5  . escitalopram (LEXAPRO) 10 MG tablet TAKE 1 TABLET BY MOUTH ONCE DAILY 90 tablet 1  . fish oil-omega-3 fatty acids 1000 MG capsule Take 1 g by mouth daily.      . fluticasone (FLONASE) 50 MCG/ACT nasal spray Place 1 spray into both nostrils daily as needed for allergies or rhinitis.    . furosemide (LASIX) 20 MG tablet TAKE 1 TABLET BY MOUTH DAILY 90 tablet 2  . gabapentin (NEURONTIN) 300 MG capsule Take 1 capsule (300 mg total) by mouth 3 (three) times daily. 270 capsule 0  . glucose blood test strip 1 each by Other route daily. Check Blood Sugar fasting each morning 100 each 3  . HYDROcodone-acetaminophen (NORCO/VICODIN) 5-325 MG tablet Take 1 tablet by mouth every 6 (six) hours as needed. 30 tablet 0  . isosorbide mononitrate (IMDUR) 30 MG 24 hr  tablet TAKE 1 TABLET BY MOUTH ONCE DAILY 90 tablet 1  . lisinopril (PRINIVIL,ZESTRIL) 20 MG tablet Take 1 tablet (20 mg total) by mouth daily. 90 tablet 0  . metFORMIN (GLUCOPHAGE) 500 MG tablet TAKE 1 TABLET BY MOUTH TWICE DAILY WITH A MEAL 180 tablet 0  . metoprolol succinate (TOPROL-XL) 100 MG 24 hr tablet TAKE 1 TABLET BY MOUTH TWICE DAILY ALONG WITH 50MG  TAB FOR A TOTAL DAILY DOSE OF 150MG  TWICE DAILY 180 tablet 1  . metoprolol succinate (TOPROL-XL) 50 MG 24 hr tablet TAKE 1 TABLET BY MOUTH TWICE DAILY WITH 100MG  TABLET FOR TOTAL DAILY DOSE OF 150MG  TWICE DAILY 180 tablet 1  . nitroGLYCERIN (NITROSTAT) 0.4 MG SL tablet Place 1 tablet (0.4 mg total) under the tongue every 5 (five) minutes as needed for chest pain. 25 tablet 1  . pantoprazole (PROTONIX) 40 MG tablet TAKE 1 TABLET BY MOUTH ONCE DAILY 90 tablet 3  . predniSONE (DELTASONE) 20 MG tablet Take 1 tablet (20 mg total) by mouth daily with breakfast. 5 tablet 0  . promethazine (PHENERGAN) 25 MG tablet Take 1 tablet (25 mg total) by mouth every 8 (eight) hours as needed for nausea or vomiting. 20 tablet 0  . rosuvastatin (CRESTOR) 20 MG tablet TAKE 1 TABLET BY MOUTH BEFORE  BEDTIME 90 tablet 1  . sitaGLIPtin (JANUVIA) 100 MG tablet Take 1 tablet (100 mg total) by mouth daily. 90 tablet 3  . warfarin (COUMADIN) 5 MG tablet TAKE 1 TABLET BY MOUTH ON MONDAYS AND 1/2 TABLET ALL OTHER DAYS (Patient taking differently: Take 2.5-5 mg by mouth daily. TAKE 1 TABLET BY MOUTH ON MONDAYS AND 1/2 TABLET ALL OTHER DAYS) 90 tablet 1  . zolpidem (AMBIEN) 10 MG tablet Take 10 mg by mouth at bedtime as needed for sleep. Reported on 08/25/2015  1   No facility-administered medications prior to visit.     Allergies: No Known Allergies    Review of Systems: See HPI for pertinent ROS. All other ROS negative.    Physical Exam: Blood pressure 118/84, pulse 90, temperature 97.4 F (36.3 C), temperature source Oral, resp. rate 16, weight 225 lb (102.1 kg),  SpO2 97 %., Body mass index is 30.52 kg/m. General:  WNWD WM Appears in no acute distress. Neck: Supple. No thyromegaly. No lymphadenopathy. Lungs: Clear bilaterally to auscultation without wheezes, rales, or rhonchi. Breathing is unlabored. Heart: Irregular rhythm.  Msk:  Strength and tone normal for age. Extremities/Skin: Warm and dry. Neuro: Alert and oriented X 3. Moves all extremities spontaneously. Gait is normal. CNII-XII grossly in tact. Psych:  Responds to questions appropriately with a normal affect.      ASSESSMENT AND PLAN:  66 y.o. year old male with  1. Long term current use of anticoagulant - PT with INR/Fingerstick  INR therapeutic at 2.4   Continue current dose of Coumadin. Recheck PT/INR 4 weeks.     Signed, [address] [place], [place], [facility] 09/25/2016 11:49 AM

## 2016-09-29 ENCOUNTER — Other Ambulatory Visit: Payer: Self-pay | Admitting: Internal Medicine

## 2016-10-14 ENCOUNTER — Other Ambulatory Visit: Payer: Self-pay | Admitting: Physician Assistant

## 2016-10-16 NOTE — Telephone Encounter (Signed)
Refill appropriate 

## 2016-10-23 ENCOUNTER — Ambulatory Visit: Payer: Medicare HMO | Admitting: Physician Assistant

## 2016-10-25 ENCOUNTER — Other Ambulatory Visit: Payer: Medicare HMO

## 2016-10-25 ENCOUNTER — Other Ambulatory Visit: Payer: Self-pay

## 2016-10-25 DIAGNOSIS — N182 Chronic kidney disease, stage 2 (mild): Secondary | ICD-10-CM | POA: Diagnosis not present

## 2016-10-25 DIAGNOSIS — E785 Hyperlipidemia, unspecified: Secondary | ICD-10-CM | POA: Diagnosis not present

## 2016-10-25 DIAGNOSIS — E119 Type 2 diabetes mellitus without complications: Secondary | ICD-10-CM | POA: Diagnosis not present

## 2016-10-25 DIAGNOSIS — Z7901 Long term (current) use of anticoagulants: Secondary | ICD-10-CM

## 2016-10-25 DIAGNOSIS — I10 Essential (primary) hypertension: Secondary | ICD-10-CM

## 2016-10-25 LAB — COMPLETE METABOLIC PANEL WITH GFR
ALT: 20 U/L (ref 9–46)
AST: 22 U/L (ref 10–35)
Albumin: 4.1 g/dL (ref 3.6–5.1)
Alkaline Phosphatase: 45 U/L (ref 40–115)
BUN: 19 mg/dL (ref 7–25)
CO2: 23 mmol/L (ref 20–31)
Calcium: 9.7 mg/dL (ref 8.6–10.3)
Chloride: 105 mmol/L (ref 98–110)
Creat: 1.16 mg/dL (ref 0.70–1.25)
GFR, Est African American: 77 mL/min (ref >=60)
GFR, Est Non African American: 66 mL/min (ref >=60)
Glucose, Bld: 103 mg/dL — ABNORMAL HIGH (ref 70–99)
Potassium: 3.9 mmol/L (ref 3.5–5.3)
Sodium: 140 mmol/L (ref 135–146)
Total Bilirubin: 0.7 mg/dL (ref 0.2–1.2)
Total Protein: 6.3 g/dL (ref 6.1–8.1)

## 2016-10-25 LAB — CBC WITH DIFFERENTIAL/PLATELET
Basophils Absolute: 62 cells/uL (ref 0–200)
Basophils Relative: 1 %
Eosinophils Absolute: 248 cells/uL (ref 15–500)
Eosinophils Relative: 4 %
HCT: 44.4 % (ref 38.5–50.0)
Hemoglobin: 15.1 g/dL (ref 13.0–17.0)
Lymphocytes Relative: 33 %
Lymphs Abs: 2046 cells/uL (ref 850–3900)
MCH: 32.4 pg (ref 27.0–33.0)
MCHC: 34 g/dL (ref 32.0–36.0)
MCV: 95.3 fL (ref 80.0–100.0)
MPV: 12.3 fL (ref 7.5–12.5)
Monocytes Absolute: 682 cells/uL (ref 200–950)
Monocytes Relative: 11 %
Neutro Abs: 3162 cells/uL (ref 1500–7800)
Neutrophils Relative %: 51 %
Platelets: 141 10*3/uL (ref 140–400)
RBC: 4.66 MIL/uL (ref 4.20–5.80)
RDW: 14.6 % (ref 11.0–15.0)
WBC: 6.2 10*3/uL (ref 3.8–10.8)

## 2016-10-25 LAB — LIPID PANEL
Cholesterol: 138 mg/dL (ref <200)
HDL: 46 mg/dL (ref >40)
LDL Cholesterol: 66 mg/dL (ref <100)
Total CHOL/HDL Ratio: 3 Ratio (ref <5.0)
Triglycerides: 132 mg/dL (ref <150)
VLDL: 26 mg/dL (ref <30)

## 2016-10-26 LAB — PROTIME-INR
INR: 2.1 — ABNORMAL HIGH
Prothrombin Time: 22.1 s — ABNORMAL HIGH (ref 9.0–11.5)

## 2016-10-27 ENCOUNTER — Encounter: Payer: Self-pay | Admitting: Physician Assistant

## 2016-11-08 ENCOUNTER — Telehealth: Payer: Self-pay | Admitting: Cardiology

## 2016-11-08 ENCOUNTER — Ambulatory Visit (INDEPENDENT_AMBULATORY_CARE_PROVIDER_SITE_OTHER): Payer: Medicare HMO | Admitting: *Deleted

## 2016-11-08 DIAGNOSIS — I495 Sick sinus syndrome: Secondary | ICD-10-CM | POA: Diagnosis not present

## 2016-11-08 NOTE — Telephone Encounter (Signed)
Attempted to confirm remote transmission with pt. No answer and was unable to leave a message.   

## 2016-11-09 ENCOUNTER — Encounter: Payer: Self-pay | Admitting: Cardiology

## 2016-11-09 NOTE — Progress Notes (Signed)
Remote pacemaker transmission.   

## 2016-11-12 ENCOUNTER — Other Ambulatory Visit: Payer: Self-pay | Admitting: Physician Assistant

## 2016-11-13 NOTE — Telephone Encounter (Signed)
Refill appropriate 

## 2016-11-17 ENCOUNTER — Other Ambulatory Visit: Payer: Self-pay

## 2016-11-17 MED ORDER — SITAGLIPTIN PHOSPHATE 100 MG PO TABS: 100.0000 mg | ORAL_TABLET | Freq: Every day | ORAL | 3 refills | Status: DC

## 2016-11-21 ENCOUNTER — Other Ambulatory Visit: Payer: Self-pay | Admitting: Physician Assistant

## 2016-11-21 DIAGNOSIS — E1142 Type 2 diabetes mellitus with diabetic polyneuropathy: Secondary | ICD-10-CM

## 2016-11-21 NOTE — Telephone Encounter (Signed)
Refill appropriate 

## 2016-11-23 ENCOUNTER — Other Ambulatory Visit: Payer: Self-pay | Admitting: Physician Assistant

## 2016-11-23 ENCOUNTER — Encounter: Payer: Self-pay | Admitting: Cardiology

## 2016-11-23 NOTE — Telephone Encounter (Signed)
Refill appropriate 

## 2016-11-27 ENCOUNTER — Ambulatory Visit (INDEPENDENT_AMBULATORY_CARE_PROVIDER_SITE_OTHER): Payer: Medicare HMO | Admitting: Physician Assistant

## 2016-11-27 ENCOUNTER — Encounter: Payer: Self-pay | Admitting: Physician Assistant

## 2016-11-27 VITALS — BP 102/82 | HR 72 | Temp 97.4°F | Resp 16 | Ht 72.0 in | Wt 220.4 lb

## 2016-11-27 DIAGNOSIS — E119 Type 2 diabetes mellitus without complications: Secondary | ICD-10-CM

## 2016-11-27 DIAGNOSIS — Z7901 Long term (current) use of anticoagulants: Secondary | ICD-10-CM

## 2016-11-27 DIAGNOSIS — I2510 Atherosclerotic heart disease of native coronary artery without angina pectoris: Secondary | ICD-10-CM | POA: Diagnosis not present

## 2016-11-27 DIAGNOSIS — E785 Hyperlipidemia, unspecified: Secondary | ICD-10-CM | POA: Diagnosis not present

## 2016-11-27 DIAGNOSIS — I10 Essential (primary) hypertension: Secondary | ICD-10-CM | POA: Diagnosis not present

## 2016-11-27 LAB — HEMOGLOBIN A1C, FINGERSTICK: Hgb A1C (fingerstick): 6.4 % — ABNORMAL HIGH (ref <5.7)

## 2016-11-27 LAB — PT WITH INR/FINGERSTICK
INR, fingerstick: 2.1 — ABNORMAL HIGH (ref 0.9–1.1)
PT, fingerstick: 25.8 seconds — ABNORMAL HIGH (ref 10.5–13.1)

## 2016-11-27 NOTE — Progress Notes (Signed)
Patient ID: [name] Cook MRN: [id number], DOB: [birth year], 66 y.o. Date of Encounter: @DATE @  Chief Complaint:  Chief Complaint  Patient presents with  . 1 month follow up    HPI: 66 y.o. year old white male  presents for  Routine followup office visit to f/u Diabetes, etc. Also Here to check his PT/INR.    He had his first office visit with me on 06/19/2013.   He had been seen in our office in the past by [name].  ----First visit with him was 01/05/2011.  ----He only saw him one other visit and that  was 04/19/2011.  Since then, patient has been seeing  Cardiology.  Had also [facility]  He had been having his PT INR checked at [facility].  However he wanted to start having it checked at our office.  He presented here on 06/19/13 for PT/INR check and also to re- establish primary care here.  COUMADIN: He is on Coumadin for history of atrial fibrillation/atrial flutter. Goal INR is 2-3. Now taking 5mg  on Mondays & Fridays. 2.5mg  all other days.  No skipped doses.  No bleeding.  No significant change in dietary intake of Vitamin K containing foods.  No change in medications.   DIABETES: My lab result note attached to labs 10/01/14. That result note stated to stop the metformin. Also stated to decrease Januvia from 100 mg to 50 mg. However, at f/u OV patient stated that he was taking metformin just one in the morning only. Said that he was off of Januvia. Said that the metformin was the only medicine he was on for diabetes..  At [place] 04/13/14--he said he was having diarrhea for months. No abdominal pain--just diarrhea.  IDecreased Metformin--At OV 06/22/14 pt says diarrhea resolved since decreasing the dose of Metformin.  At [place] 10/27/15 he reports tingling sensation across his toes on bilateral feet. Asked if there is any medicine he can use for this pain/tingling sensation. --- Prescribed Neurontin 300 mg 1 by  mouth 3 times a day at that visit.  11/27/2016: Taking diabetic meds as directed. No adv effects.   DEPRESSION: At [place] 03/09/2014 he says that he did want to discuss one thing. Says that he has been on medications for depression in the past but then he was without insurance for a while so he had gone off of those medications. He is feeling that he does need to get back on medication for this.  Says that he does not actually have episodes of crying but says that he does feel depressed and feels like he is not really interested in doing things that he used to find pleasure in. He says that there are no unusual stressors going on in his life right now says that things are same as usual but he just doesn't feel right. Feels depressed as above. Also says that it seems to be affecting his sleep and that he will sometimes wake up in the night thinking of things. Says that [name] had him on a medication that he prescribed here but says that he doesn't think that worked very well but cannot remember the name of it. I reviewed his chart and it looks like that was Lexapro. Patient says that he also knows in the past he has been on Wellbutrin at one point and was on Prozac at one point. He says that he remembers that the Wellbutrin worked well for  him. At that OV I Rxed Wellbutrin. AT [place] 04/13/14-- Pt says he is taking this as directed---now taking BID. He cant tell whether it is helping much yet or not.   At [place] 09/02/2014--he said that he thought he needed to get on Lexapro that we have discussed in the past. He said he was taking the Wellbutrin but it did not seem to be controlling his depression symptoms. Michela Pitcher that recently he had not been sleeping very well. Was waking up at about 2 or 3 AM and had hard time getting back to sleep. General did not seem very interested in doing things. Decreased motivation. Decreased pleasure in things that he used to have pleasure with. At that visit he was interested in  adding Lexapro to Wellbutrin. Added Lexapro 10 mg daily.   At prior OVs, I reviewed that the last PSA was elevated and that I referred to urology but he had deferred secondary to cost.  Finally,  at last visit we did get him scheduled with urology and discussed cost with him. At [place] with me 03/22/15--he reported to me that he had his appointment with them the following day-- December 6.  ADDENDUM: Received note from Urology dated 03/23/2015:  Their diagnosis includes: Benign localized hyperplasia of prostate with urinary obstruction Intermittent urinary stream Weak urinary stream Nocturia  Their plan includes: Complex uroflowmetry Cystoscopy Prostate ultrasound PSA reflex to free PVR ultrasound  Discussed this with pt at OV 10/27/2015.  He states that he just went for that one visit at urology and has not been able to follow-up with them since. Says that different things keep popping up and happening. Says it seems that every month something comes up. Says that he just had to buy a new battery for his vehicle and that was expensive. Says that he has very little money available after he pays his bills. Says that even to go to the urology appointment he had to pay $45 co-pay. Asked if he ever found out what his cost would be for the procedures and he says he never even found out that information and does not know.    11/27/2016: DIABETES: He does have diabetic neuropathy. He is taking gabapentin 300 mg 1 by mouth 3 times a day for this. He is on metformin. This is causing no adverse effects. He does not check his blood sugar often. Is on statin Crestor. Reviewed that at last visit he told me he had found somewhere he could get an eye exam. I asked if he had had that eye exam. He states that he's got to call and get that information again from [facility]. Says that he has lost the paper. Says that "there are two young uns in the house and doesn't know what happened with the paper" and has to  call [employer] back. Encouraged him to go ahead and f/u on this  HYPERTENSION: He is taking blood pressure medications as directed. No lightheadedness. No lower extremity edema.  HYPERLIPIDEMIA: He is taking his Crestor as directed. No myalgias or other adverse effects.  CAD: He presented to me with an office visit 10/14/14 with complaints consistent with angina. Subsequently was admitted to the hospital and underwent PTCA. As well he had a prior history of CAD and stenting in the past. This is currently stable with no angina symptoms. He reports that he recently saw [name] for routine follow-up and was told he can wait a year for follow-up visit with him. He is taking all  cardiac medications as directed with no adverse effects. Having no angina symptoms. No lightheadedness or presyncope.  Depression: This is stable and controlled. He is taking Lexapro and Wellbutrin. These are causing no adverse effects but are controlling his depression symptoms.  INSOMNIA: He is now taking Ambien 10 mg daily at bedtime and this is working well for his insomnia.  Anticoagulation: He is taking Coumadin as directed. No skipped doses. No bleeding. He is on Coumadin for history of atrial fibrillation/atrial flutter. Goal INR is 2-3. Now taking 5mg  on Mondays & Fridays. 2.5mg  all other days.  No skipped doses.  No bleeding.  No significant change in dietary intake of Vitamin K containing foods.  No change in medications.     Past Medical History:  Diagnosis Date  . Arthritis    "knees and lower back" (03/14/2013)  . Atrial flutter ([place])    radiofrequency ablation in 2001  . CAD (coronary artery disease)    a. Nonobstructive. Cardiac cath in 2001-50% mid RI, normal LM, LAD, RCA b. cath 10/16/2014 95% mid RCA treated with DES, 99% ost D1 medical management due to small aneurysmal segment  . Chronic anticoagulation    chronic Coumadin anticoagulation  . Chronic obstructive pulmonary disease ([place])  04/20/2011  . Diabetes mellitus, type 2 ([place])   . GERD (gastroesophageal reflux disease)   . Hyperlipidemia   . Hypertension    with hypertensive heart disease  . Obesity   . Persistent atrial fibrillation (HCC)    recurrent atrial flutter since 2001 s/p DCCVs, multiple failed AADs, h/o tachy-mediated cardiomyopathy  . Shortness of breath    "can come on at any time" (03/14/2013)  . Sleep apnea    "dx'd; couldn't wear the mask" (03/14/2013)  . Tobacco abuse      Home Meds:  Outpatient Medications Prior to Visit  Medication Sig Dispense Refill  . amLODipine (NORVASC) 5 MG tablet TAKE 1 TABLET BY MOUTH ONCE DAILY 90 tablet 0  . buPROPion (WELLBUTRIN SR) 150 MG 12 hr tablet TAKE 1 TABLET BY MOUTH TWICE A DAY 180 tablet 1  . celecoxib (CELEBREX) 200 MG capsule TAKE 1 CAPSULE (200 MG TOTAL) BY MOUTH DAILY. 90 capsule 1  . clopidogrel (PLAVIX) 75 MG tablet TAKE 1 TABLET BY MOUTH DAILY 90 tablet 3  . cyclobenzaprine (FLEXERIL) 10 MG tablet Take 1 tablet (10 mg total) by mouth 3 (three) times daily as needed for muscle spasms. 30 tablet 0  . digoxin (LANOXIN) 0.125 MG tablet TAKE 1 TABLET (0.125 MG TOTAL) BY MOUTH DAILY. 90 tablet 2  . empagliflozin (JARDIANCE) 10 MG TABS tablet Take 10 mg by mouth every morning. 30 tablet 5  . escitalopram (LEXAPRO) 10 MG tablet TAKE 1 TABLET BY MOUTH ONCE DAILY 90 tablet 1  . fish oil-omega-3 fatty acids 1000 MG capsule Take 1 g by mouth daily.      . fluticasone (FLONASE) 50 MCG/ACT nasal spray Place 1 spray into both nostrils daily as needed for allergies or rhinitis.    . furosemide (LASIX) 20 MG tablet TAKE 1 TABLET BY MOUTH DAILY 90 tablet 2  . gabapentin (NEURONTIN) 300 MG capsule TAKE 1 CAPSULE (300 MG TOTAL) BY MOUTH 3 (THREE) TIMES DAILY. 270 capsule 0  . glucose blood test strip 1 each by Other route daily. Check Blood Sugar fasting each morning 100 each 3  . HYDROcodone-acetaminophen (NORCO/VICODIN) 5-325 MG tablet Take 1 tablet by mouth every 6  (six) hours as needed. 30 tablet 0  . isosorbide  mononitrate (IMDUR) 30 MG 24 hr tablet TAKE 1 TABLET BY MOUTH ONCE DAILY 90 tablet 1  . lisinopril (PRINIVIL,ZESTRIL) 20 MG tablet TAKE 1 TABLET (20 MG TOTAL) BY MOUTH DAILY. 90 tablet 0  . metFORMIN (GLUCOPHAGE) 500 MG tablet TAKE 1 TABLET BY MOUTH TWICE DAILY WITH A MEAL 180 tablet 0  . metoprolol succinate (TOPROL-XL) 100 MG 24 hr tablet TAKE 1 TABLET BY MOUTH TWICE DAILY ALONG WITH 50MG  TAB FOR A TOTAL DAILY DOSE OF 150MG  TWICE DAILY 180 tablet 1  . metoprolol succinate (TOPROL-XL) 50 MG 24 hr tablet TAKE 1 TABLET BY MOUTH TWICE DAILY WITH 100MG  TABLET FOR TOTAL DAILY DOSE OF 150MG  TWICE DAILY 180 tablet 1  . nitroGLYCERIN (NITROSTAT) 0.4 MG SL tablet Place 1 tablet (0.4 mg total) under the tongue every 5 (five) minutes as needed for chest pain. 25 tablet 1  . pantoprazole (PROTONIX) 40 MG tablet TAKE 1 TABLET BY MOUTH ONCE DAILY 90 tablet 3  . promethazine (PHENERGAN) 25 MG tablet Take 1 tablet (25 mg total) by mouth every 8 (eight) hours as needed for nausea or vomiting. 20 tablet 0  . rosuvastatin (CRESTOR) 20 MG tablet TAKE 1 TABLET BY MOUTH BEFORE BEDTIME 90 tablet 1  . sitaGLIPtin (JANUVIA) 100 MG tablet Take 1 tablet (100 mg total) by mouth daily. 90 tablet 3  . warfarin (COUMADIN) 5 MG tablet TAKE 1 TABLET BY MOUTH ON MONDAYS AND 1/2 TABLET ALL OTHER DAYS (Patient taking differently: Take 2.5-5 mg by mouth daily. TAKE 1 TABLET BY MOUTH ON MONDAYS AND 1/2 TABLET ALL OTHER DAYS) 90 tablet 1  . zolpidem (AMBIEN) 10 MG tablet Take 10 mg by mouth at bedtime as needed for sleep. Reported on 08/25/2015  1  . predniSONE (DELTASONE) 20 MG tablet Take 1 tablet (20 mg total) by mouth daily with breakfast. 5 tablet 0   No facility-administered medications prior to visit.      Allergies: No Known Allergies  Social History   Social History  . Marital status: Legally Separated    Spouse name: N/A  . Number of children: 1  . Years of education:  N/A   Occupational History  . Unemployed   .  Unemployed   Social History Main Topics  . Smoking status: Former Smoker    Packs/day: 1.00    Years: 42.00    Types: Cigarettes    Quit date: 12/31/2011  . Smokeless tobacco: Never Used  . Alcohol use Yes     Comment: 03/14/2013 "stopped drinking back in 2002; never had problem w/it"  . Drug use: No  . Sexual activity: Not Currently   Other Topics Concern  . 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          Family History  Problem Relation Age of Onset  . Alzheimer's disease Mother   . Osteoporosis Mother      Review of Systems:  See HPI for pertinent ROS. All other ROS negative.    Physical Exam: Blood pressure 102/82, pulse 72, temperature (!) 97.4 F (36.3 C), temperature source Oral, resp. rate 16, height 6' (1.829 m), weight 220 lb 6.4 oz (100 kg), SpO2 96 %., Body mass index is 29.89 kg/m. General: WNWD WM. Appears in no acute distress. Neck: Supple. No thyromegaly. No lymphadenopathy.  No carotid bruits. Lungs: Clear bilaterally to auscultation without wheezes, rales, or rhonchi. Breathing is unlabored. Heart: Irreg rhythm. No murmur. Abdomen: Soft, non-tender, non-distended with normoactive bowel sounds. No hepatomegaly. No rebound/guarding. No obvious abdominal masses. Musculoskeletal:  Strength and tone normal for age. Extremities/Skin: Warm and dry. No clubbing or cyanosis. No edema. No rashes or suspicious lesions. Neuro: Alert and oriented X 3. Moves all extremities spontaneously. Gait is normal. CNII-XII grossly in tact. Psych:  Responds to questions appropriately with a normal affect. Diabetic foot exam: Inspection is normal.  Sensation is intact and normal.  He has no palpable posterior tibial pulses bilaterally. 1+ Left dorsalis pedis  pulses. No palpable DP pulse on the Right.       ASSESSMENT AND PLAN:  66 y.o. year old male with   FINANCIAL DISTRESS---SEE # 12 AND # 13 BELOW. ---HAS NOT BEEN ABLE TO AFFORD COPAY TO SEE GI OR UROLOGY.  FINALLY, IS GOING TO SEE UROLOGY---Appt 03/23/2015                                      ----ALSO, SEE HPI---PRIOR TO COMING TO THIS OFFICE, WENT TO FREE CLINIC AND HEALTH DEPARTMENT    -----WILL HAVE OUR STAFF SEE IF THN OR SOME OTHER ASSISTANCE CAN HELP HIM  He has Medicare secondary to Disability--cannot use savings cards with Medicare.     ADDENDUM: Received note from Urology dated 03/23/2015:  Their diagnosis includes: Benign localized hyperplasia of prostate with urinary obstruction Intermittent urinary stream Weak urinary stream Nocturia  Their plan includes: Complex uroflowmetry Cystoscopy Prostate ultrasound PSA reflex to free PVR ultrasound  See HPI from [place] 10/27/2015 regarding update regarding this issue.   Coronary Artery Disease He underwent cardiac catheterization 03/17/2013. Was not favorable for PCI. He has had medical treatment of CAD. 10/14/14 he was admitted to the hospital with angina. He subsequently underwent PCI. 11/27/2016: This is stable/controlled-- managed by cardiology.   Chronic anticoagulation--- 05/24/2016: Recheck PT/INR now 11/27/2016---INR---2.0--- continue current dosing of Coumadin. Recheck PT/INR 4 weeks.  Atrial fibrillation 11/27/2016: This is stable/controlled--  This is managed by Cardiology and also EP-- [name]  Atrial flutter  11/27/2016: This is stable/controlled-- This is managed by Cardiology and also EP-- [name] Pacemaker  11/27/2016: This is stable/controlled--  This is managed by Cardiology and also EP-- [name]  Depression 11/27/2016: This is stable and controlled. Continue Wellbutrin and Lexapro  Insomnia 11/27/2016: This is well controlled with Ambien 10 mg.  Diabetes mellitus, type 2  11/27/2016:  Check  A1C today Check MicroAlbumin  11/27/2016: He is on ACE inhibitor. 11/27/2016: He is on statin therapy.  Foot exam is stable. Patient states that he had an eye exam about one year ago. Is due to schedule followup and patient plans to schedule this. He was told that exam showed "no diabetic damage " At [place] 10/01/2014 he states that his eye exam is due "right now ". I wrote on his AVS to remind him to call to schedule follow-up eye exam. At [place] 03/22/15 he states that he still has not seen ophthalmologist. He is aware that he needs to do so. At [place] 05/24/2016-- He reports that he found a place where he can get an eye exam with no co-pay. He is scheduling an appointment for there. At [place] 08/21/2016----Reviewed that at last visit he told me he had found somewhere he could get an  eye exam. I asked if he had had that eye exam. He states that he's got to call and get that information again from [facility]. Says that he has lost the paper. Says that "there are two young uns in the house and doesn't know what happened with the paper" and has to call [employer] back. Encouraged him to go ahead and f/u on this  He received Pneumovax 23 here 01/05/2011 No further pneumonia vaccine indicated until age 10  Diabetic Neuropathy At [place] 10/27/2015---Added Neurontin 300mg  1 po TID  Hypertension 08/21/2016: At goal. On ACE inhibitor.  08/21/2016: Check labs to monitor. 11/27/2016--blood pressure at goal. On ACE inhibitor. Labs were good 08/2016.  Hyperlipidemia 08/21/2016: At last lab -- LFTs normal.Last Lipid Panel- Lipid Panel is excellent. Continued Crestor 20 mg. 08/21/2016: He is not fasting today so will wait to recheck FLP. Will check LFTs. 11/27/2016: Labs been stable. Continue Crestor.  Chronic obstructive pulmonary disease 08/21/2016: Quit smoking 2013. 08/21/2016: He is on Advair. 11/27/2016: His breathing is stable on current medications.  GASTROESOPHAGEAL REFLUX DISEASE 11/27/2016: Protonix can be used with Plavix to continue  this one.  Prostate cancer screening -- Elevated PSA -PSA was elevated 02/05/2014.--4.91.  I ordered referral to urology. Patient reported that he could not afford the co-pay and did not follow-up with urology.  PSA repeated 06/22/2014---4.71---I had noted for staff to see if  can find any type of assistance program to help cover cost so that he can follow-up with urology.However, this has not happened.  AT [place] 10/01/2014--I am discussing with staff again to investigate options for financial assistance/other services, options to help him. AT [place] 03/22/2015--- he states that he has his appointment with urology tomorrow and is planning to follow-up and go to that appointment.   ADDENDUM: Received note from Urology dated 03/23/2015:  Their diagnosis includes: Benign localized hyperplasia of prostate with urinary obstruction Intermittent urinary stream Weak urinary stream Nocturia  Their plan includes: Complex uroflowmetry Cystoscopy Prostate ultrasound PSA reflex to free PVR ultrasound  See HPI from [place] 10/27/2015 regarding update regarding this issue.   Screening for colorectal cancer The following is copied form his OV on 06/26/2013:   "He reports that he has never had a colonoscopy. Says in the past he was going to -but that's when he then lost his insurance. He is agreeable to proceed with this and is agreeable for me to do her referral for followup with GI. - Ambulatory referral to Gastroenterology "  At f/u OV 11/06/2013 he reports: He cannot even afford the co-pay to see GI for the office visit. His co-pay for that visit was going to be $45. He does not even know what his cost would be for the colonoscopy itself. OV 10/2015 he still reports the same. Says that he has not followed up with GI.   Immunizations: He received Pneumovax 23 here in the office on 01/05/2011 No further pneumonia vaccine indicated until age 51. He received tetanus here at our office 01/05/2011 Influenza  Vaccine----Given here 04/13/2014, 01/04/2015 Zostavax: So far, have not even discussed, as cost will likely be prohibitive       Follow-up here in 4 weeks for PT/INR. He will follow-up in 3 months for routine follow-up visit.    Signed, [address] [place], [place], [facility] 11/27/2016 11:28 AM

## 2016-11-28 LAB — MICROALBUMIN, URINE: Microalb, Ur: <0.2 mg/dL

## 2016-12-05 LAB — CUP PACEART REMOTE DEVICE CHECK
Battery Impedance: 181 Ohm
Battery Remaining Longevity: 146 mo
Battery Voltage: 2.8 V
Brady Statistic RV Percent Paced: 100 %
Date Time Interrogation Session: 20180726013016
Implantable Lead Implant Date: 20141010
Implantable Lead Implant Date: 20141010
Implantable Lead Location: 753859
Implantable Lead Location: 753860
Implantable Lead Model: 5076
Implantable Lead Model: 5076
Implantable Pulse Generator Implant Date: 20141010
Lead Channel Impedance Value: 67 Ohm
Lead Channel Impedance Value: 707 Ohm
Lead Channel Pacing Threshold Amplitude: 0.875 V
Lead Channel Pacing Threshold Pulse Width: 0.4 ms
Lead Channel Setting Pacing Amplitude: 2 V
Lead Channel Setting Pacing Pulse Width: 0.4 ms
Lead Channel Setting Sensing Sensitivity: 4 mV

## 2016-12-10 ENCOUNTER — Other Ambulatory Visit: Payer: Self-pay | Admitting: Physician Assistant

## 2016-12-11 NOTE — Telephone Encounter (Signed)
Refill appropriate 

## 2016-12-19 ENCOUNTER — Other Ambulatory Visit: Payer: Self-pay | Admitting: Physician Assistant

## 2016-12-20 ENCOUNTER — Other Ambulatory Visit: Payer: Self-pay | Admitting: Physician Assistant

## 2016-12-25 ENCOUNTER — Ambulatory Visit (INDEPENDENT_AMBULATORY_CARE_PROVIDER_SITE_OTHER): Payer: Medicare HMO | Admitting: Physician Assistant

## 2016-12-25 ENCOUNTER — Encounter: Payer: Self-pay | Admitting: Physician Assistant

## 2016-12-25 ENCOUNTER — Other Ambulatory Visit: Payer: Self-pay | Admitting: Physician Assistant

## 2016-12-25 VITALS — BP 100/70 | HR 90 | Temp 97.6°F | Resp 18 | Ht 72.0 in | Wt 221.0 lb

## 2016-12-25 DIAGNOSIS — Z7901 Long term (current) use of anticoagulants: Secondary | ICD-10-CM

## 2016-12-25 DIAGNOSIS — I4891 Unspecified atrial fibrillation: Secondary | ICD-10-CM

## 2016-12-25 LAB — PT WITH INR/FINGERSTICK
INR, fingerstick: 2.4 ratio — ABNORMAL HIGH
PT, fingerstick: 29.2 s — ABNORMAL HIGH (ref 10.5–13.1)

## 2016-12-25 NOTE — Progress Notes (Signed)
Patient ID: [name] Cook MRN: [id number], DOB: 1950/12/29, 66 y.o. Date of Encounter: 12/25/2016, 12:56 PM    Chief Complaint:  Chief Complaint  Patient presents with  . PT/INR Check     HPI: 66 y.o. year old male presents for above.   He reports that currently his current Coumadin dose he is takingL A whole tablet on Mondays only. Takes a half a tablet all other days of the week.  Reports he has had no change in medications recently. Reports that he has had no bleeding. Reports that his dietary intake of vitamin K has been pretty consistent.     Home Meds:   Outpatient Medications Prior to Visit  Medication Sig Dispense Refill  . amLODipine (NORVASC) 5 MG tablet TAKE 1 TABLET BY MOUTH EVERY DAY 90 tablet 1  . buPROPion (WELLBUTRIN SR) 150 MG 12 hr tablet TAKE 1 TABLET BY MOUTH TWICE A DAY 180 tablet 1  . celecoxib (CELEBREX) 200 MG capsule TAKE 1 CAPSULE (200 MG TOTAL) BY MOUTH DAILY. 90 capsule 1  . clopidogrel (PLAVIX) 75 MG tablet TAKE 1 TABLET BY MOUTH DAILY 90 tablet 3  . cyclobenzaprine (FLEXERIL) 10 MG tablet Take 1 tablet (10 mg total) by mouth 3 (three) times daily as needed for muscle spasms. 30 tablet 0  . digoxin (LANOXIN) 0.125 MG tablet TAKE 1 TABLET (0.125 MG TOTAL) BY MOUTH DAILY. 90 tablet 2  . escitalopram (LEXAPRO) 10 MG tablet TAKE 1 TABLET BY MOUTH ONCE DAILY 90 tablet 1  . fish oil-omega-3 fatty acids 1000 MG capsule Take 1 g by mouth daily.      . fluticasone (FLONASE) 50 MCG/ACT nasal spray Place 1 spray into both nostrils daily as needed for allergies or rhinitis.    . furosemide (LASIX) 20 MG tablet TAKE 1 TABLET BY MOUTH DAILY 90 tablet 2  . gabapentin (NEURONTIN) 300 MG capsule TAKE 1 CAPSULE (300 MG TOTAL) BY MOUTH 3 (THREE) TIMES DAILY. 270 capsule 0  . glucose blood test strip 1 each by Other route daily. Check Blood Sugar fasting each morning 100 each 3  . HYDROcodone-acetaminophen (NORCO/VICODIN) 5-325 MG tablet Take 1 tablet by mouth every  6 (six) hours as needed. 30 tablet 0  . isosorbide mononitrate (IMDUR) 30 MG 24 hr tablet TAKE 1 TABLET BY MOUTH ONCE DAILY 90 tablet 1  . JARDIANCE 10 MG TABS tablet TAKE 1 TABLET (10 MG) BY MOUTH EVERY MORNING. 30 tablet 1  . lisinopril (PRINIVIL,ZESTRIL) 20 MG tablet TAKE 1 TABLET (20 MG TOTAL) BY MOUTH DAILY. 90 tablet 0  . metFORMIN (GLUCOPHAGE) 500 MG tablet TAKE 1 TABLET BY MOUTH TWICE DAILY WITH A MEAL 180 tablet 0  . metoprolol succinate (TOPROL-XL) 100 MG 24 hr tablet TAKE 1 TABLET BY MOUTH TWICE DAILY ALONG WITH 50MG  TAB FOR A TOTAL DAILY DOSE OF 150MG  TWICE DAILY 180 tablet 1  . metoprolol succinate (TOPROL-XL) 50 MG 24 hr tablet TAKE 1 TABLET BY MOUTH TWICE DAILY WITH 100MG  TABLET FOR TOTAL DAILY DOSE OF 150MG  TWICE DAILY 180 tablet 1  . nitroGLYCERIN (NITROSTAT) 0.4 MG SL tablet Place 1 tablet (0.4 mg total) under the tongue every 5 (five) minutes as needed for chest pain. 25 tablet 1  . pantoprazole (PROTONIX) 40 MG tablet TAKE 1 TABLET BY MOUTH ONCE DAILY 90 tablet 3  . promethazine (PHENERGAN) 25 MG tablet Take 1 tablet (25 mg total) by mouth every 8 (eight) hours as needed for nausea or vomiting. 20 tablet 0  .  rosuvastatin (CRESTOR) 20 MG tablet TAKE 1 TABLET BY MOUTH BEFORE BEDTIME 90 tablet 1  . sitaGLIPtin (JANUVIA) 100 MG tablet Take 1 tablet (100 mg total) by mouth daily. 90 tablet 3  . warfarin (COUMADIN) 5 MG tablet TAKE 1 TABLET BY MOUTH ON MONDAYS AND 1/2 TABLET ALL OTHER DAYS 90 tablet 3  . zolpidem (AMBIEN) 10 MG tablet Take 10 mg by mouth at bedtime as needed for sleep. Reported on 08/25/2015  1   No facility-administered medications prior to visit.     Allergies: No Known Allergies    Review of Systems: See HPI for pertinent ROS. All other ROS negative.    Physical Exam: Blood pressure 100/70, pulse 90, temperature 97.6 F (36.4 C), temperature source Oral, resp. rate 18, height 6' (1.829 m), weight 221 lb (100.2 kg), SpO2 98 %., Body mass index is 29.97  kg/m. General:  WM. Appears in no acute distress. Neck: Supple. No thyromegaly. No lymphadenopathy. Lungs: Clear bilaterally to auscultation without wheezes, rales, or rhonchi. Breathing is unlabored. Heart: Irregular rhythm.  Msk:  Strength and tone normal for age. Extremities/Skin: Warm and dry.  Neuro: Alert and oriented X 3. Moves all extremities spontaneously. Gait is normal. CNII-XII grossly in tact. Psych:  Responds to questions appropriately with a normal affect.   Results for orders placed or performed in visit on 12/25/16  PT with INR/Fingerstick  Result Value Ref Range   INR, fingerstick 2.4 (H) ratio   PT, fingerstick 29.2 (H) 10.5 - 13.1 sec     ASSESSMENT AND PLAN:  66 y.o. year old male with  1. Atrial fibrillation, unspecified type ([place]) - PT with INR/Fingerstick; Standing - PT with INR/Fingerstick  2. Long term current use of anticoagulant therapy INR is therapeutic at 2.4. He is to continue current dose of Coumadin. Recheck 4 weeks.  [address] [place], [place], [facility] 12/25/2016 12:56 PM

## 2016-12-27 ENCOUNTER — Other Ambulatory Visit: Payer: Self-pay | Admitting: Physician Assistant

## 2016-12-27 ENCOUNTER — Encounter: Payer: Self-pay | Admitting: Physician Assistant

## 2016-12-27 DIAGNOSIS — B9789 Other viral agents as the cause of diseases classified elsewhere: Secondary | ICD-10-CM

## 2016-12-27 DIAGNOSIS — J988 Other specified respiratory disorders: Principal | ICD-10-CM

## 2017-01-07 ENCOUNTER — Other Ambulatory Visit: Payer: Self-pay | Admitting: Physician Assistant

## 2017-01-08 NOTE — Telephone Encounter (Signed)
Refill appropriate 

## 2017-01-12 ENCOUNTER — Other Ambulatory Visit: Payer: Self-pay | Admitting: Physician Assistant

## 2017-01-12 NOTE — Telephone Encounter (Signed)
Refill appropriate 

## 2017-01-16 ENCOUNTER — Other Ambulatory Visit: Payer: Self-pay | Admitting: Physician Assistant

## 2017-01-16 NOTE — Telephone Encounter (Signed)
Refill appropriate and filled per protocol. 

## 2017-01-22 ENCOUNTER — Encounter: Payer: Self-pay | Admitting: Physician Assistant

## 2017-01-22 ENCOUNTER — Ambulatory Visit (INDEPENDENT_AMBULATORY_CARE_PROVIDER_SITE_OTHER): Payer: Medicare HMO | Admitting: Physician Assistant

## 2017-01-22 VITALS — BP 110/74 | HR 90 | Temp 97.9°F | Resp 16 | Ht 72.0 in | Wt 224.0 lb

## 2017-01-22 DIAGNOSIS — J988 Other specified respiratory disorders: Secondary | ICD-10-CM

## 2017-01-22 DIAGNOSIS — B9789 Other viral agents as the cause of diseases classified elsewhere: Secondary | ICD-10-CM | POA: Diagnosis not present

## 2017-01-22 DIAGNOSIS — I4891 Unspecified atrial fibrillation: Secondary | ICD-10-CM

## 2017-01-22 DIAGNOSIS — H6121 Impacted cerumen, right ear: Secondary | ICD-10-CM | POA: Diagnosis not present

## 2017-01-22 DIAGNOSIS — Z7901 Long term (current) use of anticoagulants: Secondary | ICD-10-CM

## 2017-01-22 LAB — PT WITH INR/FINGERSTICK
INR, fingerstick: 1.8 ratio — ABNORMAL HIGH
PT, fingerstick: 21.3 s — ABNORMAL HIGH (ref 10.5–13.1)

## 2017-01-22 NOTE — Progress Notes (Signed)
Patient ID: [name] Cook MRN: [id number], DOB: Mar 22, 1951, 66 y.o. Date of Encounter: @DATE @  Chief Complaint:  Chief Complaint  Patient presents with  . PT/INR    5mg  on Monday. 2.5mg  AOD  . Illness    x3 days- nasal drainage, nonproductive cough    HPI: 66 y.o. year old male  presents with above.   He is here for PT/INR check to monitor his Coumadin. Reports that he is taking 5 mg on file Mondays. Taking 2.5 mg all other day of the week. He is having no bleeding. He has been on no other new additional medications recently.  He reports that he has been having some nasal congestion and some cough that started around Friday and today is Monday. Says that he is not blowing anything out of his nose. Says he just feels a little congested in his nose. States he is having some cough. No fevers or chills. No sore throat.  No other concerns to address today.   Past Medical History:  Diagnosis Date  . Arthritis    "knees and lower back" (03/14/2013)  . Atrial flutter ([place])    radiofrequency ablation in 2001  . CAD (coronary artery disease)    a. Nonobstructive. Cardiac cath in 2001-50% mid RI, normal LM, LAD, RCA b. cath 10/16/2014 95% mid RCA treated with DES, 99% ost D1 medical management due to small aneurysmal segment  . Chronic anticoagulation    chronic Coumadin anticoagulation  . Chronic obstructive pulmonary disease ([place]) 04/20/2011  . Diabetes mellitus, type 2 ([place])   . GERD (gastroesophageal reflux disease)   . Hyperlipidemia   . Hypertension    with hypertensive heart disease  . Obesity   . Persistent atrial fibrillation (HCC)    recurrent atrial flutter since 2001 s/p DCCVs, multiple failed AADs, h/o tachy-mediated cardiomyopathy  . Shortness of breath    "can come on at any time" (03/14/2013)  . Sleep apnea    "dx'd; couldn't wear the mask" (03/14/2013)  . Tobacco abuse      Home Meds: Outpatient Medications Prior to Visit  Medication Sig Dispense Refill  .  amLODipine (NORVASC) 5 MG tablet TAKE 1 TABLET BY MOUTH EVERY DAY 90 tablet 1  . buPROPion (WELLBUTRIN SR) 150 MG 12 hr tablet TAKE 1 TABLET BY MOUTH TWICE A DAY 180 tablet 1  . celecoxib (CELEBREX) 200 MG capsule TAKE 1 CAPSULE (200 MG TOTAL) BY MOUTH DAILY. 90 capsule 1  . clopidogrel (PLAVIX) 75 MG tablet TAKE 1 TABLET BY MOUTH DAILY 90 tablet 3  . cyclobenzaprine (FLEXERIL) 10 MG tablet Take 1 tablet (10 mg total) by mouth 3 (three) times daily as needed for muscle spasms. 30 tablet 0  . digoxin (LANOXIN) 0.125 MG tablet TAKE 1 TABLET (0.125 MG TOTAL) BY MOUTH DAILY. 90 tablet 2  . escitalopram (LEXAPRO) 10 MG tablet TAKE 1 TABLET BY MOUTH ONCE DAILY 90 tablet 1  . fish oil-omega-3 fatty acids 1000 MG capsule Take 1 g by mouth daily.      . fluticasone (FLONASE) 50 MCG/ACT nasal spray Place 1 spray into both nostrils daily as needed for allergies or rhinitis.    . furosemide (LASIX) 20 MG tablet TAKE 1 TABLET BY MOUTH DAILY 90 tablet 2  . gabapentin (NEURONTIN) 300 MG capsule TAKE 1 CAPSULE (300 MG TOTAL) BY MOUTH 3 (THREE) TIMES DAILY. 270 capsule 0  . glucose blood test strip 1 each by Other route daily. Check Blood Sugar fasting each morning  100 each 3  . HYDROcodone-acetaminophen (NORCO/VICODIN) 5-325 MG tablet Take 1 tablet by mouth every 6 (six) hours as needed. 30 tablet 0  . isosorbide mononitrate (IMDUR) 30 MG 24 hr tablet TAKE 1 TABLET BY MOUTH ONCE DAILY 90 tablet 1  . JARDIANCE 10 MG TABS tablet TAKE 1 TABLET (10 MG) BY MOUTH EVERY MORNING. 30 tablet 1  . lisinopril (PRINIVIL,ZESTRIL) 20 MG tablet TAKE 1 TABLET BY MOUTH EVERY DAY 90 tablet 0  . metFORMIN (GLUCOPHAGE) 500 MG tablet TAKE 1 TABLET BY MOUTH TWICE DAILY WITH A MEAL 180 tablet 0  . metoprolol succinate (TOPROL-XL) 100 MG 24 hr tablet TAKE 1 TABLET BY MOUTH TWICE DAILY ALONG WITH 50MG  TAB FOR A TOTAL DAILY DOSE OF 150MG  TWICE DAILY 180 tablet 1  . metoprolol succinate (TOPROL-XL) 50 MG 24 hr tablet TAKE 1 TABLET BY MOUTH  TWICE DAILY WITH 100MG  TABLET FOR TOTAL DAILY DOSE OF 150MG  TWICE DAILY 180 tablet 1  . nitroGLYCERIN (NITROSTAT) 0.4 MG SL tablet Place 1 tablet (0.4 mg total) under the tongue every 5 (five) minutes as needed for chest pain. 25 tablet 1  . pantoprazole (PROTONIX) 40 MG tablet TAKE 1 TABLET BY MOUTH ONCE DAILY 90 tablet 3  . promethazine (PHENERGAN) 25 MG tablet Take 1 tablet (25 mg total) by mouth every 8 (eight) hours as needed for nausea or vomiting. 20 tablet 0  . rosuvastatin (CRESTOR) 20 MG tablet TAKE 1 TABLET BY MOUTH BEFORE BEDTIME 90 tablet 1  . sitaGLIPtin (JANUVIA) 100 MG tablet Take 1 tablet (100 mg total) by mouth daily. 90 tablet 3  . warfarin (COUMADIN) 5 MG tablet TAKE 1 TABLET BY MOUTH ON MONDAYS AND 1/2 TABLET ALL OTHER DAYS 90 tablet 3  . fluticasone (FLONASE) 50 MCG/ACT nasal spray PLACE 2 SPRAYS INTO BOTH NOSTRILS DAILY. (Patient not taking: Reported on 01/22/2017) 48 g 3  . zolpidem (AMBIEN) 10 MG tablet Take 10 mg by mouth at bedtime as needed for sleep. Reported on 08/25/2015  1   No facility-administered medications prior to visit.     Allergies: No Known Allergies  Social History   Social History  . Marital status: Legally Separated    Spouse name: N/A  . Number of children: 1  . Years of education: N/A   Occupational History  . Unemployed   .  Unemployed   Social History Main Topics  . Smoking status: Former Smoker    Packs/day: 1.00    Years: 42.00    Types: Cigarettes    Quit date: 12/31/2011  . Smokeless tobacco: Never Used  . Alcohol use Yes     Comment: 03/14/2013 "stopped drinking back in 2002; never had problem w/it"  . Drug use: No  . Sexual activity: Not Currently   Other Topics Concern  . 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          Family History  Problem  Relation Age of Onset  . Alzheimer's disease Mother   . Osteoporosis Mother      Review of Systems:  See HPI for pertinent ROS. All other ROS negative.    Physical Exam: Blood pressure 110/74, pulse 90, temperature 97.9 F (36.6 C), temperature source Oral, resp. rate 16, height 6' (1.829 m), weight 101.6 kg (224 lb), SpO2  95 %., Body mass index is 30.38 kg/m. General: WNWD WM. Appears in no acute distress. Head: Normocephalic, atraumatic, eyes without discharge, sclera non-icteric, nares are without discharge. Right ear canal obstructed by cerumen. Left ear canal clear. Left TM is without perforation, pearly grey and translucent with reflective cone of light. Oral cavity moist, posterior pharynx without exudate, erythema, peritonsillar abscess.  Neck: Supple. No thyromegaly. No lymphadenopathy. Lungs: Clear bilaterally to auscultation without wheezes, rales, or rhonchi. Breathing is unlabored. Heart: Irregular rhythm. No murmur.  Musculoskeletal:  Strength and tone normal for age. Extremities/Skin: Warm and dry.  Neuro: Alert and oriented X 3. Moves all extremities spontaneously. Gait is normal. CNII-XII grossly in tact. Psych:  Responds to questions appropriately with a normal affect.   Results for orders placed or performed in visit on 01/22/17  PT with INR/Fingerstick  Result Value Ref Range   INR, fingerstick 1.8 (H) ratio   PT, fingerstick 21.3 (H) 10.5 - 13.1 sec     ASSESSMENT AND PLAN:  66 y.o. year old male with  1. Atrial fibrillation, unspecified type (HCC) INR is 1.8. He will continue current dose of Coumadin but will recheck PT/INR in 2 weeks. - PT with INR/Fingerstick  2. Long term current use of anticoagulant therapy INR is 1.8. He will continue current dose of Coumadin but will recheck PT/INR in 2 weeks.   3. Viral respiratory infection Discussed with patient that at this time I see no indication that he needs antibiotics.  Discussed that this is most likely  caused by virus which will run its course and resolve on its on.  Discussed that if symptoms worsen or persist greater than 7-10 days then call me and will add antibiotic if needed.  4. Impacted cerumen of right ear Discussed that he has cerumen impaction in the right ear. Recommended him use drops over-the-counter. He states he has had this irrigated here before and would rather have it irrigated here rather than using the drops. Irrigated here today.  Follow-up in 2 weeks to recheck INR.   Signed, [address] [place], [place], [facility] 01/22/2017 10:16 AM

## 2017-01-25 ENCOUNTER — Encounter: Payer: Self-pay | Admitting: Physician Assistant

## 2017-02-05 ENCOUNTER — Ambulatory Visit: Payer: Medicare HMO | Admitting: Physician Assistant

## 2017-02-07 ENCOUNTER — Ambulatory Visit (INDEPENDENT_AMBULATORY_CARE_PROVIDER_SITE_OTHER): Payer: Medicare HMO | Admitting: *Deleted

## 2017-02-07 ENCOUNTER — Telehealth: Payer: Self-pay | Admitting: Cardiology

## 2017-02-07 DIAGNOSIS — I495 Sick sinus syndrome: Secondary | ICD-10-CM

## 2017-02-07 NOTE — Progress Notes (Signed)
Remote pacemaker transmission.   

## 2017-02-07 NOTE — Telephone Encounter (Signed)
Attempted to confirm remote transmission with pt. No answer and was unable to leave a message.   

## 2017-02-09 ENCOUNTER — Encounter: Payer: Self-pay | Admitting: Cardiology

## 2017-02-09 LAB — CUP PACEART REMOTE DEVICE CHECK
Battery Impedance: 205 Ohm
Battery Remaining Longevity: 142 mo
Battery Voltage: 2.8 V
Brady Statistic RV Percent Paced: 100 %
Date Time Interrogation Session: 20181024185305
Implantable Lead Implant Date: 20141010
Implantable Lead Implant Date: 20141010
Implantable Lead Location: 753859
Implantable Lead Location: 753860
Implantable Lead Model: 5076
Implantable Lead Model: 5076
Implantable Pulse Generator Implant Date: 20141010
Lead Channel Impedance Value: 67 Ohm
Lead Channel Impedance Value: 738 Ohm
Lead Channel Pacing Threshold Amplitude: 0.75 V
Lead Channel Pacing Threshold Pulse Width: 0.4 ms
Lead Channel Setting Pacing Amplitude: 2 V
Lead Channel Setting Pacing Pulse Width: 0.4 ms
Lead Channel Setting Sensing Sensitivity: 4 mV

## 2017-02-13 ENCOUNTER — Other Ambulatory Visit: Payer: Self-pay

## 2017-02-13 MED ORDER — EMPAGLIFLOZIN 10 MG PO TABS: ORAL_TABLET | ORAL | 1 refills | Status: DC

## 2017-02-13 NOTE — Telephone Encounter (Signed)
Refill appropriate 

## 2017-02-14 ENCOUNTER — Other Ambulatory Visit: Payer: Self-pay | Admitting: Physician Assistant

## 2017-02-16 ENCOUNTER — Other Ambulatory Visit: Payer: Self-pay | Admitting: Physician Assistant

## 2017-02-16 DIAGNOSIS — E1142 Type 2 diabetes mellitus with diabetic polyneuropathy: Secondary | ICD-10-CM

## 2017-02-16 NOTE — Telephone Encounter (Signed)
Refill appropriate 

## 2017-02-23 ENCOUNTER — Encounter: Payer: Self-pay | Admitting: Physician Assistant

## 2017-03-28 ENCOUNTER — Ambulatory Visit: Payer: Medicare HMO | Admitting: Physician Assistant

## 2017-03-28 ENCOUNTER — Encounter: Payer: Self-pay | Admitting: Physician Assistant

## 2017-03-28 ENCOUNTER — Other Ambulatory Visit: Payer: Self-pay

## 2017-03-28 VITALS — BP 118/84 | HR 94 | Temp 97.9°F | Resp 18 | Wt 223.8 lb

## 2017-03-28 DIAGNOSIS — E785 Hyperlipidemia, unspecified: Secondary | ICD-10-CM

## 2017-03-28 DIAGNOSIS — I10 Essential (primary) hypertension: Secondary | ICD-10-CM

## 2017-03-28 DIAGNOSIS — J988 Other specified respiratory disorders: Secondary | ICD-10-CM | POA: Diagnosis not present

## 2017-03-28 DIAGNOSIS — I481 Persistent atrial fibrillation: Secondary | ICD-10-CM | POA: Diagnosis not present

## 2017-03-28 DIAGNOSIS — E119 Type 2 diabetes mellitus without complications: Secondary | ICD-10-CM

## 2017-03-28 DIAGNOSIS — Z7901 Long term (current) use of anticoagulants: Secondary | ICD-10-CM | POA: Diagnosis not present

## 2017-03-28 DIAGNOSIS — J439 Emphysema, unspecified: Secondary | ICD-10-CM | POA: Diagnosis not present

## 2017-03-28 DIAGNOSIS — B9689 Other specified bacterial agents as the cause of diseases classified elsewhere: Principal | ICD-10-CM

## 2017-03-28 DIAGNOSIS — I4819 Other persistent atrial fibrillation: Secondary | ICD-10-CM

## 2017-03-28 DIAGNOSIS — I2510 Atherosclerotic heart disease of native coronary artery without angina pectoris: Secondary | ICD-10-CM

## 2017-03-28 MED ORDER — PROMETHAZINE HCL 25 MG PO TABS: 25.0000 mg | ORAL_TABLET | Freq: Three times a day (TID) | ORAL | 0 refills | Status: DC | PRN

## 2017-03-28 MED ORDER — CEFDINIR 300 MG PO CAPS: 300.0000 mg | ORAL_CAPSULE | Freq: Two times a day (BID) | ORAL | 0 refills | Status: DC

## 2017-03-28 NOTE — Progress Notes (Signed)
Patient ID: KERIC Cook MRN: [id number], DOB: July 29, 1950, 66 y.o. Date of Encounter: @DATE @  Chief Complaint:  Chief Complaint  Patient presents with  . chest congestion    x1week  . head congestion    HPI: 66 y.o. year old male  presents with above.   Also, I reviewed that he is due for routine office visit and also for PT/INR check.  Even though it is 2:30 in the afternoon, he is fasting.  Asked why he had not eaten anything, he says that he is felt so congested and feels bad so just really has not had much appetite.  Reports that he has been having congestion in his head and chest.  For 1 week.  Coughing up yellow phlegm.  Has had no known fevers.  No significant sore throat.  Taking his Coumadin as directed.  No skipped doses.  Has had no known bleeding.  Taking medications for diabetes as directed.  Having no adverse effects.  Taking statin as directed.  No myalgias or other adverse effects.  Taking Blood pressure meds as directed.  No lightheadedness.    Past Medical History:  Diagnosis Date  . Arthritis    "knees and lower back" (03/14/2013)  . Atrial flutter ([place])    radiofrequency ablation in 2001  . CAD (coronary artery disease)    a. Nonobstructive. Cardiac cath in 2001-50% mid RI, normal LM, LAD, RCA b. cath 10/16/2014 95% mid RCA treated with DES, 99% ost D1 medical management due to small aneurysmal segment  . Chronic anticoagulation    chronic Coumadin anticoagulation  . Chronic obstructive pulmonary disease ([place]) 04/20/2011  . Diabetes mellitus, type 2 ([place])   . GERD (gastroesophageal reflux disease)   . Hyperlipidemia   . Hypertension    with hypertensive heart disease  . Obesity   . Persistent atrial fibrillation (HCC)    recurrent atrial flutter since 2001 s/p DCCVs, multiple failed AADs, h/o tachy-mediated cardiomyopathy  . Shortness of breath    "can come on at any time" (03/14/2013)  . Sleep apnea    "dx'd; couldn't wear the mask" (03/14/2013)   . Tobacco abuse      Home Meds: Outpatient Medications Prior to Visit  Medication Sig Dispense Refill  . amLODipine (NORVASC) 5 MG tablet TAKE 1 TABLET BY MOUTH EVERY DAY 90 tablet 1  . buPROPion (WELLBUTRIN SR) 150 MG 12 hr tablet TAKE 1 TABLET BY MOUTH TWICE A DAY 180 tablet 1  . celecoxib (CELEBREX) 200 MG capsule TAKE 1 CAPSULE (200 MG TOTAL) BY MOUTH DAILY. 90 capsule 1  . clopidogrel (PLAVIX) 75 MG tablet TAKE 1 TABLET BY MOUTH DAILY 90 tablet 3  . cyclobenzaprine (FLEXERIL) 10 MG tablet Take 1 tablet (10 mg total) by mouth 3 (three) times daily as needed for muscle spasms. 30 tablet 0  . digoxin (LANOXIN) 0.125 MG tablet TAKE 1 TABLET (0.125 MG TOTAL) BY MOUTH DAILY. 90 tablet 2  . empagliflozin (JARDIANCE) 10 MG TABS tablet TAKE 1 TABLET (10 MG) BY MOUTH EVERY MORNING. 90 tablet 1  . escitalopram (LEXAPRO) 10 MG tablet TAKE 1 TABLET BY MOUTH ONCE DAILY 90 tablet 1  . fish oil-omega-3 fatty acids 1000 MG capsule Take 1 g by mouth daily.      . fluticasone (FLONASE) 50 MCG/ACT nasal spray Place 1 spray into both nostrils daily as needed for allergies or rhinitis.    . furosemide (LASIX) 20 MG tablet TAKE 1 TABLET BY MOUTH DAILY 90 tablet  2  . gabapentin (NEURONTIN) 300 MG capsule TAKE 1 CAPSULE BY MOUTH THREE TIMES A DAY 270 capsule 0  . glucose blood test strip 1 each by Other route daily. Check Blood Sugar fasting each morning 100 each 3  . HYDROcodone-acetaminophen (NORCO/VICODIN) 5-325 MG tablet Take 1 tablet by mouth every 6 (six) hours as needed. 30 tablet 0  . isosorbide mononitrate (IMDUR) 30 MG 24 hr tablet TAKE 1 TABLET BY MOUTH ONCE DAILY 90 tablet 1  . lisinopril (PRINIVIL,ZESTRIL) 20 MG tablet TAKE 1 TABLET BY MOUTH EVERY DAY 90 tablet 0  . metFORMIN (GLUCOPHAGE) 500 MG tablet TAKE 1 TABLET BY MOUTH TWICE DAILY WITH A MEAL 180 tablet 0  . metoprolol succinate (TOPROL-XL) 100 MG 24 hr tablet TAKE 1 TABLET BY MOUTH TWICE DAILY ALONG WITH 50MG  TAB FOR A TOTAL DAILY DOSE OF  150MG  TWICE DAILY 180 tablet 1  . metoprolol succinate (TOPROL-XL) 50 MG 24 hr tablet TAKE 1 TABLET BY MOUTH TWICE DAILY WITH 100MG  TABLET FOR TOTAL DAILY DOSE OF 150MG  TWICE DAILY 180 tablet 1  . nitroGLYCERIN (NITROSTAT) 0.4 MG SL tablet Place 1 tablet (0.4 mg total) under the tongue every 5 (five) minutes as needed for chest pain. 25 tablet 1  . pantoprazole (PROTONIX) 40 MG tablet TAKE 1 TABLET BY MOUTH ONCE DAILY 90 tablet 3  . rosuvastatin (CRESTOR) 20 MG tablet TAKE 1 TABLET BY MOUTH BEFORE BEDTIME 90 tablet 1  . sitaGLIPtin (JANUVIA) 100 MG tablet Take 1 tablet (100 mg total) by mouth daily. 90 tablet 3  . warfarin (COUMADIN) 5 MG tablet TAKE 1 TABLET BY MOUTH ON MONDAYS AND 1/2 TABLET ALL OTHER DAYS 90 tablet 3  . promethazine (PHENERGAN) 25 MG tablet Take 1 tablet (25 mg total) by mouth every 8 (eight) hours as needed for nausea or vomiting. 20 tablet 0   No facility-administered medications prior to visit.     Allergies: No Known Allergies  Social History   Socioeconomic History  . Marital status: Legally Separated    Spouse name: Not on file  . Number of children: 1  . Years of education: Not on file  . Highest education level: Not on file  Social Needs  . Financial resource strain: Not on file  . Food insecurity - worry: Not on file  . Food insecurity - inability: Not on file  . Transportation needs - medical: Not on file  . Transportation needs - non-medical: Not on file  Occupational History  . Occupation: Merchandiser, retail: UNEMPLOYED  Tobacco Use  . Smoking status: Former Smoker    Packs/day: 1.00    Years: 42.00    Pack years: 42.00    Types: Cigarettes    Last attempt to quit: 12/31/2011    Years since quitting: 5.2  . Smokeless tobacco: Never Used  Substance and Sexual Activity  . Alcohol use: Yes    Comment: 03/14/2013 "stopped drinking back in 2002; never had problem w/it"  . Drug use: No  . Sexual activity: Not Currently  Other Topics Concern    . 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          Family History  Problem Relation Age of Onset  . Alzheimer's disease Mother   . Osteoporosis Mother  Review of Systems:  See HPI for pertinent ROS. All other ROS negative.    Physical Exam: Blood pressure 118/84, pulse 94, temperature 97.9 F (36.6 C), temperature source Oral, resp. rate 18, weight 101.5 kg (223 lb 12.8 oz), SpO2 94 %., Body mass index is 30.35 kg/m. General: WNWD WM. Appears in no acute distress. Head: Normocephalic, atraumatic, eyes without discharge, sclera non-icteric, nares are without discharge. Bilateral auditory canals clear, TM's are without perforation, pearly grey and translucent with reflective cone of light bilaterally. Oral cavity moist, posterior pharynx without exudate, erythema, peritonsillar abscess.  Neck: Supple. No thyromegaly. No lymphadenopathy.  No carotid bruit. Lungs: Clear bilaterally to auscultation without wheezes, rales, or rhonchi. Breathing is unlabored. Heart: Irregular. No murmur Abdomen: Soft, non-tender, non-distended with normoactive bowel sounds. No hepatomegaly. No rebound/guarding. No obvious abdominal masses. Musculoskeletal:  Strength and tone normal for age. Extremities/Skin: Warm and dry.  No LE edema.  Diabetic foot exam: Inspection is normal.  There are no wounds or problem areas. Neuro: Alert and oriented X 3. Moves all extremities spontaneously. Gait is normal. CNII-XII grossly in tact. Psych:  Responds to questions appropriately with a normal affect.     ASSESSMENT AND PLAN:  66 y.o. year old male with  1. Bacterial respiratory infection Will not use azithromycin given that he is on Coumadin and digoxin. He is to take the [facility] as directed and complete all of it.  Follow-up if symptoms  do not resolve upon completion of this. - cefdinir (OMNICEF) 300 MG capsule; Take 1 capsule (300 mg total) by mouth 2 (two) times daily.  Dispense: 20 capsule; Refill: 0  2. Atrial fibrillation He is taking Coumadin as directed.  Check INR. - Protime-INR  3. Essential hypertension Blood Pressure is at goal.  Continue current medications.  Check lab to monitor. - COMPLETE METABOLIC PANEL WITH GFR  4. Coronary artery disease involving native coronary artery of native heart without angina pectoris Per cardiology.  Stable.  5. Pulmonary emphysema, unspecified emphysema type ([place]) This is stable.  6. Type 2 diabetes mellitus without complication, without long-term current use of insulin ([place]) He is taking medications as directed.  Check lab to monitor. - COMPLETE METABOLIC PANEL WITH GFR - Hemoglobin A1c  7. Chronic anticoagulation He is taking Coumadin as directed.  Check INR. - Protime-INR  8. Hyperlipidemia, unspecified hyperlipidemia type Is on statin.  He is fasting.  Check labs to monitor. - COMPLETE METABOLIC PANEL WITH GFR - Lipid panel  9. Long term current use of anticoagulant therapy He is taking Coumadin as directed.  Check INR. - Protime-INR   Signed, [address] [place], [place], [facility] 03/28/2017 2:34 PM

## 2017-03-29 LAB — COMPLETE METABOLIC PANEL WITH GFR
AG Ratio: 2 (calc) (ref 1.0–2.5)
ALT: 20 U/L (ref 9–46)
AST: 22 U/L (ref 10–35)
Albumin: 4.3 g/dL (ref 3.6–5.1)
Alkaline phosphatase (APISO): 57 U/L (ref 40–115)
BUN: 14 mg/dL (ref 7–25)
CO2: 28 mmol/L (ref 20–32)
Calcium: 9.3 mg/dL (ref 8.6–10.3)
Chloride: 104 mmol/L (ref 98–110)
Creat: 0.99 mg/dL (ref 0.70–1.25)
GFR, Est African American: 93 mL/min/{1.73_m2} (ref >=60)
GFR, Est Non African American: 80 mL/min/{1.73_m2} (ref >=60)
Globulin: 2.2 g/dL (calc) (ref 1.9–3.7)
Glucose, Bld: 113 mg/dL — ABNORMAL HIGH (ref 65–99)
Potassium: 4.2 mmol/L (ref 3.5–5.3)
Sodium: 140 mmol/L (ref 135–146)
Total Bilirubin: 0.8 mg/dL (ref 0.2–1.2)
Total Protein: 6.5 g/dL (ref 6.1–8.1)

## 2017-03-29 LAB — PROTIME-INR
INR: 2.7 — ABNORMAL HIGH
Prothrombin Time: 28.8 s — ABNORMAL HIGH (ref 9.0–11.5)

## 2017-03-29 LAB — HEMOGLOBIN A1C
Hgb A1c MFr Bld: 6.3 % of total Hgb — ABNORMAL HIGH (ref <5.7)
Mean Plasma Glucose: 134 (calc)
eAG (mmol/L): 7.4 (calc)

## 2017-03-29 LAB — LIPID PANEL
Cholesterol: 127 mg/dL (ref <200)
HDL: 50 mg/dL (ref >40)
LDL Cholesterol (Calc): 56 mg/dL (calc)
Non-HDL Cholesterol (Calc): 77 mg/dL (calc) (ref <130)
Total CHOL/HDL Ratio: 2.5 (calc) (ref <5.0)
Triglycerides: 120 mg/dL (ref <150)

## 2017-04-14 ENCOUNTER — Other Ambulatory Visit: Payer: Self-pay | Admitting: Physician Assistant

## 2017-04-19 ENCOUNTER — Encounter: Payer: Self-pay | Admitting: Physician Assistant

## 2017-04-19 ENCOUNTER — Other Ambulatory Visit: Payer: Self-pay

## 2017-04-19 ENCOUNTER — Ambulatory Visit (INDEPENDENT_AMBULATORY_CARE_PROVIDER_SITE_OTHER): Payer: Medicare HMO | Admitting: Physician Assistant

## 2017-04-19 VITALS — BP 110/74 | HR 96 | Temp 97.6°F | Resp 16 | Ht 72.0 in | Wt 221.8 lb

## 2017-04-19 DIAGNOSIS — J988 Other specified respiratory disorders: Secondary | ICD-10-CM | POA: Diagnosis not present

## 2017-04-19 DIAGNOSIS — B9689 Other specified bacterial agents as the cause of diseases classified elsewhere: Principal | ICD-10-CM

## 2017-04-19 MED ORDER — LEVOFLOXACIN 500 MG PO TABS: 500.0000 mg | ORAL_TABLET | Freq: Every day | ORAL | 0 refills | Status: AC

## 2017-04-19 NOTE — Progress Notes (Signed)
Patient ID: [name] Cook MRN: [id number], DOB: April 13, 1951, 67 y.o. Date of Encounter: 04/19/2017, 12:36 PM    Chief Complaint:  Chief Complaint  Patient presents with  . chest congestion     HPI: 67 y.o. year old male presents with above.   I reviewed his office note from 03/28/17.  At that visit he reported that he had been experiencing congestion in his head and chest for about 1 week.  Had been coughing up yellow phlegm.  Had no significant sore throat and no known fevers.  At that visit I prescribed Omnicef to take twice daily for 10 days.  Today he reports he took the [place] as directed and completed it.  He reports that his congestion has cleared up in his head but did not clear up in his chest.  Continues with chest congestion and cough productive of thick dark phlegm.  Has been having no fevers or chills.     Home Meds:   Outpatient Medications Prior to Visit  Medication Sig Dispense Refill  . amLODipine (NORVASC) 5 MG tablet TAKE 1 TABLET BY MOUTH EVERY DAY 90 tablet 1  . buPROPion (WELLBUTRIN SR) 150 MG 12 hr tablet TAKE 1 TABLET BY MOUTH TWICE A DAY 180 tablet 1  . celecoxib (CELEBREX) 200 MG capsule TAKE 1 CAPSULE (200 MG TOTAL) BY MOUTH DAILY. 90 capsule 1  . clopidogrel (PLAVIX) 75 MG tablet TAKE 1 TABLET BY MOUTH DAILY 90 tablet 3  . cyclobenzaprine (FLEXERIL) 10 MG tablet Take 1 tablet (10 mg total) by mouth 3 (three) times daily as needed for muscle spasms. 30 tablet 0  . digoxin (LANOXIN) 0.125 MG tablet TAKE 1 TABLET (0.125 MG TOTAL) BY MOUTH DAILY. 90 tablet 2  . empagliflozin (JARDIANCE) 10 MG TABS tablet TAKE 1 TABLET (10 MG) BY MOUTH EVERY MORNING. 90 tablet 1  . escitalopram (LEXAPRO) 10 MG tablet TAKE 1 TABLET BY MOUTH ONCE DAILY 90 tablet 1  . fish oil-omega-3 fatty acids 1000 MG capsule Take 1 g by mouth daily.      . fluticasone (FLONASE) 50 MCG/ACT nasal spray Place 1 spray into both nostrils daily as needed for allergies or rhinitis.    . furosemide  (LASIX) 20 MG tablet TAKE 1 TABLET BY MOUTH DAILY 90 tablet 2  . gabapentin (NEURONTIN) 300 MG capsule TAKE 1 CAPSULE BY MOUTH THREE TIMES A DAY 270 capsule 0  . glucose blood test strip 1 each by Other route daily. Check Blood Sugar fasting each morning 100 each 3  . HYDROcodone-acetaminophen (NORCO/VICODIN) 5-325 MG tablet Take 1 tablet by mouth every 6 (six) hours as needed. 30 tablet 0  . isosorbide mononitrate (IMDUR) 30 MG 24 hr tablet TAKE 1 TABLET BY MOUTH ONCE DAILY 90 tablet 1  . lisinopril (PRINIVIL,ZESTRIL) 20 MG tablet TAKE 1 TABLET BY MOUTH EVERY DAY 90 tablet 1  . metFORMIN (GLUCOPHAGE) 500 MG tablet TAKE 1 TABLET BY MOUTH TWICE DAILY WITH A MEAL 180 tablet 0  . metoprolol succinate (TOPROL-XL) 100 MG 24 hr tablet TAKE 1 TABLET BY MOUTH TWICE DAILY ALONG WITH 50MG  TAB FOR A TOTAL DAILY DOSE OF 150MG  TWICE DAILY 180 tablet 1  . metoprolol succinate (TOPROL-XL) 50 MG 24 hr tablet TAKE 1 TABLET BY MOUTH TWICE DAILY WITH 100MG  TABLET FOR TOTAL DAILY DOSE OF 150MG  TWICE DAILY 180 tablet 1  . nitroGLYCERIN (NITROSTAT) 0.4 MG SL tablet Place 1 tablet (0.4 mg total) under the tongue every 5 (five) minutes as needed  for chest pain. 25 tablet 1  . pantoprazole (PROTONIX) 40 MG tablet TAKE 1 TABLET BY MOUTH ONCE DAILY 90 tablet 3  . promethazine (PHENERGAN) 25 MG tablet Take 1 tablet (25 mg total) by mouth every 8 (eight) hours as needed for nausea or vomiting. 20 tablet 0  . rosuvastatin (CRESTOR) 20 MG tablet TAKE 1 TABLET BY MOUTH BEFORE BEDTIME 90 tablet 1  . sitaGLIPtin (JANUVIA) 100 MG tablet Take 1 tablet (100 mg total) by mouth daily. 90 tablet 3  . warfarin (COUMADIN) 5 MG tablet TAKE 1 TABLET BY MOUTH ON MONDAYS AND 1/2 TABLET ALL OTHER DAYS 90 tablet 3  . cefdinir (OMNICEF) 300 MG capsule Take 1 capsule (300 mg total) by mouth 2 (two) times daily. 20 capsule 0   No facility-administered medications prior to visit.     Allergies: No Known Allergies    Review of Systems: See HPI  for pertinent ROS. All other ROS negative.    Physical Exam: Blood pressure 110/74, pulse 96, temperature 97.6 F (36.4 C), temperature source Oral, resp. rate 16, height 6' (1.829 m), weight 100.6 kg (221 lb 12.8 oz), SpO2 98 %., Body mass index is 30.08 kg/m. General: WNWD WM.  Appears in no acute distress. HEENT: Normocephalic, atraumatic, eyes without discharge, sclera non-icteric, nares are without discharge. Bilateral auditory canals clear, TM's are without perforation, pearly grey and translucent with reflective cone of light bilaterally. Oral cavity moist, posterior pharynx without exudate, erythema, peritonsillar abscess.  Neck: Supple. No thyromegaly. No lymphadenopathy. Lungs: Clear bilaterally to auscultation without wheezes, rales, or rhonchi. Breathing is unlabored. Heart: Regular rhythm. No murmurs, rubs, or gallops. Msk:  Strength and tone normal for age. Extremities/Skin: Warm and dry.  Neuro: Alert and oriented X 3. Moves all extremities spontaneously. Gait is normal. CNII-XII grossly in tact. Psych:  Responds to questions appropriately with a normal affect.     ASSESSMENT AND PLAN:  67 y.o. year old male with  1. Bacterial respiratory infection Lungs are clear with no wheezing rhonchi or rales.  Chest congestion and phlegm did not resolve with Omnicef.  At this time will treat with Levaquin.  To take the Levaquin as directed and complete all 7 days.  Recommend Mucinex DM as expectorant as well. - levofloxacin (LEVAQUIN) 500 MG tablet; Take 1 tablet (500 mg total) by mouth daily for 7 days.  Dispense: 7 tablet; Refill: 0   Signed, [address] [place], [place], [facility] 04/19/2017 12:36 PM

## 2017-04-30 ENCOUNTER — Other Ambulatory Visit: Payer: Self-pay | Admitting: Physician Assistant

## 2017-04-30 ENCOUNTER — Encounter: Payer: Self-pay | Admitting: Physician Assistant

## 2017-04-30 ENCOUNTER — Other Ambulatory Visit: Payer: Self-pay

## 2017-04-30 ENCOUNTER — Ambulatory Visit (INDEPENDENT_AMBULATORY_CARE_PROVIDER_SITE_OTHER): Payer: Medicare HMO | Admitting: Physician Assistant

## 2017-04-30 VITALS — BP 100/74 | HR 86 | Temp 97.5°F | Resp 16 | Ht 72.0 in | Wt 223.2 lb

## 2017-04-30 DIAGNOSIS — Z7901 Long term (current) use of anticoagulants: Secondary | ICD-10-CM | POA: Diagnosis not present

## 2017-04-30 LAB — PT WITH INR/FINGERSTICK
INR, fingerstick: 2.6 ratio — ABNORMAL HIGH
PT, fingerstick: 31.6 s — ABNORMAL HIGH (ref 10.5–13.1)

## 2017-04-30 NOTE — Telephone Encounter (Signed)
Refill appropriate 

## 2017-04-30 NOTE — Progress Notes (Signed)
Patient ID: [name] Cook MRN: [id number], DOB: May 20, 1950, 67 y.o. Date of Encounter: 04/30/2017, 10:24 AM    Chief Complaint:  Chief Complaint  Patient presents with  . 1 month follow up     HPI: 67 y.o. year old male presents with above.   He comes in today for PT/INR check to monitor his Coumadin.  He verifies that he is taking the Coumadin 5 mg tablet--- takes a whole on Mondays, takes a half all other days of the week. He has had no bleeding.  No skipped doses.  No other concerns to address today.     Home Meds:   Outpatient Medications Prior to Visit  Medication Sig Dispense Refill  . amLODipine (NORVASC) 5 MG tablet TAKE 1 TABLET BY MOUTH EVERY DAY 90 tablet 1  . buPROPion (WELLBUTRIN SR) 150 MG 12 hr tablet TAKE 1 TABLET BY MOUTH TWICE A DAY 180 tablet 1  . celecoxib (CELEBREX) 200 MG capsule TAKE 1 CAPSULE (200 MG TOTAL) BY MOUTH DAILY. 90 capsule 1  . clopidogrel (PLAVIX) 75 MG tablet TAKE 1 TABLET BY MOUTH DAILY 90 tablet 3  . cyclobenzaprine (FLEXERIL) 10 MG tablet Take 1 tablet (10 mg total) by mouth 3 (three) times daily as needed for muscle spasms. 30 tablet 0  . digoxin (LANOXIN) 0.125 MG tablet TAKE 1 TABLET (0.125 MG TOTAL) BY MOUTH DAILY. 90 tablet 2  . empagliflozin (JARDIANCE) 10 MG TABS tablet TAKE 1 TABLET (10 MG) BY MOUTH EVERY MORNING. 90 tablet 1  . escitalopram (LEXAPRO) 10 MG tablet TAKE 1 TABLET BY MOUTH ONCE DAILY 90 tablet 1  . fish oil-omega-3 fatty acids 1000 MG capsule Take 1 g by mouth daily.      . fluticasone (FLONASE) 50 MCG/ACT nasal spray Place 1 spray into both nostrils daily as needed for allergies or rhinitis.    Marland Kitchen gabapentin (NEURONTIN) 300 MG capsule TAKE 1 CAPSULE BY MOUTH THREE TIMES A DAY 270 capsule 0  . glucose blood test strip 1 each by Other route daily. Check Blood Sugar fasting each morning 100 each 3  . HYDROcodone-acetaminophen (NORCO/VICODIN) 5-325 MG tablet Take 1 tablet by mouth every 6 (six) hours as needed. 30 tablet  0  . isosorbide mononitrate (IMDUR) 30 MG 24 hr tablet TAKE 1 TABLET BY MOUTH ONCE DAILY 90 tablet 1  . lisinopril (PRINIVIL,ZESTRIL) 20 MG tablet TAKE 1 TABLET BY MOUTH EVERY DAY 90 tablet 1  . metFORMIN (GLUCOPHAGE) 500 MG tablet TAKE 1 TABLET BY MOUTH TWICE DAILY WITH A MEAL 180 tablet 0  . metoprolol succinate (TOPROL-XL) 100 MG 24 hr tablet TAKE 1 TABLET BY MOUTH TWICE DAILY ALONG WITH 50MG  TAB FOR A TOTAL DAILY DOSE OF 150MG  TWICE DAILY 180 tablet 1  . metoprolol succinate (TOPROL-XL) 50 MG 24 hr tablet TAKE 1 TABLET BY MOUTH TWICE DAILY WITH 100MG  TABLET FOR TOTAL DAILY DOSE OF 150MG  TWICE DAILY 180 tablet 1  . nitroGLYCERIN (NITROSTAT) 0.4 MG SL tablet Place 1 tablet (0.4 mg total) under the tongue every 5 (five) minutes as needed for chest pain. 25 tablet 1  . pantoprazole (PROTONIX) 40 MG tablet TAKE 1 TABLET BY MOUTH ONCE DAILY 90 tablet 3  . promethazine (PHENERGAN) 25 MG tablet Take 1 tablet (25 mg total) by mouth every 8 (eight) hours as needed for nausea or vomiting. 20 tablet 0  . rosuvastatin (CRESTOR) 20 MG tablet TAKE 1 TABLET BY MOUTH BEFORE BEDTIME 90 tablet 1  . sitaGLIPtin (JANUVIA) 100  MG tablet Take 1 tablet (100 mg total) by mouth daily. 90 tablet 3  . warfarin (COUMADIN) 5 MG tablet TAKE 1 TABLET BY MOUTH ON MONDAYS AND 1/2 TABLET ALL OTHER DAYS 90 tablet 3  . furosemide (LASIX) 20 MG tablet TAKE 1 TABLET BY MOUTH DAILY 90 tablet 2   No facility-administered medications prior to visit.     Allergies: No Known Allergies    Review of Systems: See HPI for pertinent ROS. All other ROS negative.    Physical Exam: Blood pressure 100/74, pulse 86, temperature (!) 97.5 F (36.4 C), temperature source Oral, resp. rate 16, height 6' (1.829 m), weight 101.2 kg (223 lb 3.2 oz), SpO2 96 %., Body mass index is 30.27 kg/m. General: WNWD WM.  Appears in no acute distress. Neck: Supple. No thyromegaly. No lymphadenopathy. Lungs: Clear bilaterally to auscultation without  wheezes, rales, or rhonchi. Breathing is unlabored. Heart: Irregular rhythm. Msk:  Strength and tone normal for age. Extremities/Skin: Warm and dry.  Neuro: Alert and oriented X 3. Moves all extremities spontaneously. Gait is normal. CNII-XII grossly in tact. Psych:  Responds to questions appropriately with a normal affect.     ASSESSMENT AND PLAN:  67 y.o. year old male with  1. Anticoagulant long-term use INR is therapeutic at 2.6.  Continue current dosing of Coumadin.  He has a 5 mg tablet.  He takes a whole on Mondays. He only takes a half of a tablet on all other days of the week.  Recheck PT/INR in 4 weeks.  Follow-up sooner if needed. - PT with INR/Fingerstick   Signed, Olean Ree [place], [place], [facility] 04/30/2017 10:24 AM

## 2017-05-09 ENCOUNTER — Ambulatory Visit (INDEPENDENT_AMBULATORY_CARE_PROVIDER_SITE_OTHER): Payer: Medicare HMO | Admitting: *Deleted

## 2017-05-09 DIAGNOSIS — I495 Sick sinus syndrome: Secondary | ICD-10-CM

## 2017-05-09 NOTE — Progress Notes (Signed)
Remote pacemaker transmission.   

## 2017-05-10 ENCOUNTER — Encounter: Payer: Self-pay | Admitting: Internal Medicine

## 2017-05-10 ENCOUNTER — Encounter: Payer: Self-pay | Admitting: Cardiology

## 2017-05-10 ENCOUNTER — Ambulatory Visit: Payer: Medicare HMO | Admitting: Internal Medicine

## 2017-05-10 VITALS — BP 90/60 | HR 96 | Resp 16 | Ht 72.0 in | Wt 225.0 lb

## 2017-05-10 DIAGNOSIS — Z950 Presence of cardiac pacemaker: Secondary | ICD-10-CM | POA: Diagnosis not present

## 2017-05-10 DIAGNOSIS — I482 Chronic atrial fibrillation, unspecified: Secondary | ICD-10-CM

## 2017-05-10 LAB — CUP PACEART INCLINIC DEVICE CHECK
Battery Impedance: 181 Ohm
Battery Remaining Longevity: 144 mo
Battery Voltage: 2.8 V
Brady Statistic RV Percent Paced: 100 %
Date Time Interrogation Session: 20190124150840
Implantable Lead Implant Date: 20141010
Implantable Lead Implant Date: 20141010
Implantable Lead Location: 753859
Implantable Lead Location: 753860
Implantable Lead Model: 5076
Implantable Lead Model: 5076
Implantable Pulse Generator Implant Date: 20141010
Lead Channel Impedance Value: 643 Ohm
Lead Channel Impedance Value: 67 Ohm
Lead Channel Pacing Threshold Amplitude: 0.75 V
Lead Channel Pacing Threshold Pulse Width: 0.4 ms
Lead Channel Setting Pacing Amplitude: 2.5 V
Lead Channel Setting Pacing Pulse Width: 0.4 ms
Lead Channel Setting Sensing Sensitivity: 4 mV

## 2017-05-10 NOTE — Progress Notes (Signed)
HPI [name] returns today for followup of his PPM, atrial fib and HTN. He is a pleasant 67 yo man with the above problems and diastolic heart failure. His atrial fib could not be controlled. He underwent AV node ablation several years ago. In the interim, he has done well. He gets fatigued if he tries to rush. He has not had syncope and minimal peripheral edema.  No Known Allergies   Current Outpatient Medications  Medication Sig Dispense Refill  . amLODipine (NORVASC) 5 MG tablet TAKE 1 TABLET BY MOUTH EVERY DAY 90 tablet 1  . buPROPion (WELLBUTRIN SR) 150 MG 12 hr tablet TAKE 1 TABLET BY MOUTH TWICE A DAY 180 tablet 1  . celecoxib (CELEBREX) 200 MG capsule TAKE 1 CAPSULE (200 MG TOTAL) BY MOUTH DAILY. 90 capsule 1  . clopidogrel (PLAVIX) 75 MG tablet TAKE 1 TABLET BY MOUTH DAILY 90 tablet 3  . cyclobenzaprine (FLEXERIL) 10 MG tablet Take 1 tablet (10 mg total) by mouth 3 (three) times daily as needed for muscle spasms. 30 tablet 0  . empagliflozin (JARDIANCE) 10 MG TABS tablet TAKE 1 TABLET (10 MG) BY MOUTH EVERY MORNING. 90 tablet 1  . escitalopram (LEXAPRO) 10 MG tablet TAKE 1 TABLET BY MOUTH ONCE DAILY 90 tablet 1  . fish oil-omega-3 fatty acids 1000 MG capsule Take 1 g by mouth daily.      . fluticasone (FLONASE) 50 MCG/ACT nasal spray Place 1 spray into both nostrils daily as needed for allergies or rhinitis.    . furosemide (LASIX) 20 MG tablet TAKE 1 TABLET BY MOUTH DAILY 90 tablet 2  . gabapentin (NEURONTIN) 300 MG capsule TAKE 1 CAPSULE BY MOUTH THREE TIMES A DAY 270 capsule 0  . glucose blood test strip 1 each by Other route daily. Check Blood Sugar fasting each morning 100 each 3  . HYDROcodone-acetaminophen (NORCO/VICODIN) 5-325 MG tablet Take 1 tablet by mouth every 6 (six) hours as needed. 30 tablet 0  . isosorbide mononitrate (IMDUR) 30 MG 24 hr tablet TAKE 1 TABLET BY MOUTH ONCE DAILY 90 tablet 1  . lisinopril (PRINIVIL,ZESTRIL) 20 MG tablet TAKE 1 TABLET BY MOUTH  EVERY DAY 90 tablet 1  . metFORMIN (GLUCOPHAGE) 500 MG tablet TAKE 1 TABLET BY MOUTH TWICE DAILY WITH A MEAL 180 tablet 0  . metoprolol succinate (TOPROL-XL) 100 MG 24 hr tablet TAKE 1 TABLET BY MOUTH TWICE DAILY ALONG WITH 50MG  TAB FOR A TOTAL DAILY DOSE OF 150MG  TWICE DAILY 180 tablet 1  . metoprolol succinate (TOPROL-XL) 50 MG 24 hr tablet TAKE 1 TABLET BY MOUTH TWICE DAILY WITH 100MG  TABLET FOR TOTAL DAILY DOSE OF 150MG  TWICE DAILY 180 tablet 1  . nitroGLYCERIN (NITROSTAT) 0.4 MG SL tablet Place 1 tablet (0.4 mg total) under the tongue every 5 (five) minutes as needed for chest pain. 25 tablet 1  . pantoprazole (PROTONIX) 40 MG tablet TAKE 1 TABLET BY MOUTH ONCE DAILY 90 tablet 3  . promethazine (PHENERGAN) 25 MG tablet Take 1 tablet (25 mg total) by mouth every 8 (eight) hours as needed for nausea or vomiting. 20 tablet 0  . rosuvastatin (CRESTOR) 20 MG tablet TAKE 1 TABLET BY MOUTH BEFORE BEDTIME 90 tablet 1  . sitaGLIPtin (JANUVIA) 100 MG tablet Take 1 tablet (100 mg total) by mouth daily. 90 tablet 3  . warfarin (COUMADIN) 5 MG tablet TAKE 1 TABLET BY MOUTH ON MONDAYS AND 1/2 TABLET ALL OTHER DAYS 90 tablet 3   No  current facility-administered medications for this visit.      Past Medical History:  Diagnosis Date  . Arthritis    "knees and lower back" (03/14/2013)  . Atrial flutter ([place])    radiofrequency ablation in 2001  . CAD (coronary artery disease)    a. Nonobstructive. Cardiac cath in 2001-50% mid RI, normal LM, LAD, RCA b. cath 10/16/2014 95% mid RCA treated with DES, 99% ost D1 medical management due to small aneurysmal segment  . Chronic anticoagulation    chronic Coumadin anticoagulation  . Chronic obstructive pulmonary disease ([place]) 04/20/2011  . Diabetes mellitus, type 2 ([place])   . GERD (gastroesophageal reflux disease)   . Hyperlipidemia   . Hypertension    with hypertensive heart disease  . Obesity   . Persistent atrial fibrillation (HCC)    recurrent atrial flutter  since 2001 s/p DCCVs, multiple failed AADs, h/o tachy-mediated cardiomyopathy  . Shortness of breath    "can come on at any time" (03/14/2013)  . Sleep apnea    "dx'd; couldn't wear the mask" (03/14/2013)  . Tobacco abuse     ROS:   All systems reviewed and negative except as noted in the HPI.   Past Surgical History:  Procedure Laterality Date  . ATRIAL FLUTTER ABLATION  2002   atrial flutter; subsequently developed atrial fibrillation  . AV NODE ABLATION  01/24/2013  . CARDIAC CATHETERIZATION  2002  . CARDIAC CATHETERIZATION N/A 10/16/2014   Procedure: Left Heart Cath and Coronary Angiography;  Surgeon: [name], MD;  Location: [facility];  Service: Cardiovascular;  Laterality: N/A;  . CARDIOVERSION  05/31/2011   Procedure: CARDIOVERSION;  Surgeon: Cristopher Estimable. Lattie Haw, MD;  Location: [facility];  Service: Cardiovascular;  Laterality: N/A;  . CARPAL TUNNEL RELEASE Left 1980's  . INSERT / REPLACE / REMOVE PACEMAKER  01/24/2013    Medtronic Adapta L dual-chamber pacemaker, serial number [id number]   . LEFT HEART CATHETERIZATION WITH CORONARY ANGIOGRAM N/A 03/17/2013   Procedure: LEFT HEART CATHETERIZATION WITH CORONARY ANGIOGRAM;  Surgeon: Burnell Blanks, MD; LAD mild dz, D1 branch 100%, inferior branch 99%, CFX OK, RCA 50%, EF 65%    . LOOP RECORDER IMPLANT  2002  . PERMANENT PACEMAKER INSERTION N/A 01/24/2013   Procedure: PERMANENT PACEMAKER INSERTION;  Surgeon: [name], MD;  Location: [facility] C[facility];  Service: Cardiovascular;  Laterality: N/A;  . TIBIAL TUBERCLERPLASTY  ~ 2003     Family History  Problem Relation Age of Onset  . Alzheimer's disease Mother   . Osteoporosis Mother      Social History   Socioeconomic History  . Marital status: Legally Separated    Spouse name: Not on file  . Number of children: 1  . Years of education: Not on file  . Highest education level: Not on file  Social Needs  . Financial resource strain: Not on file  .  Food insecurity - worry: Not on file  . Food insecurity - inability: Not on file  . Transportation needs - medical: Not on file  . Transportation needs - non-medical: Not on file  Occupational History  . Occupation: Merchandiser, retail: UNEMPLOYED  Tobacco Use  . Smoking status: Former Smoker    Packs/day: 1.00    Years: 42.00    Pack years: 42.00    Types: Cigarettes    Last attempt to quit: 12/31/2011    Years since quitting: 5.3  . Smokeless tobacco: Never Used  Substance and Sexual Activity  .  Alcohol use: Yes    Comment: 03/14/2013 "stopped drinking back in 2002; never had problem w/it"  . Drug use: No  . Sexual activity: Not Currently  Other Topics Concern  . 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           BP 90/60   Pulse 96   Resp 16   Ht 6' (1.829 m)   Wt 225 lb (102.1 kg)   SpO2 95%   BMI 30.52 kg/m   Physical Exam:  Well appearing 67 yo man, NAD HEENT: Unremarkable Neck:  6 cm JVD, no thyromegally Lymphatics:  No adenopathy Back:  No CVA tenderness Lungs:  Clear with no wheezesHEART:  Regular rate rhythm, no murmurs, no rubs, no clicks Abd:  soft, positive bowel sounds, no organomegally, no rebound, no guarding Ext:  2 plus pulses, no edema, no cyanosis, no clubbing Skin:  No rashes no nodules Neuro:  CN II through XII intact, motor grossly intact  EKG - atrial fib with ventricular pacing  DEVICE  Normal device function.  See PaceArt for details.   Assess/Plan: 1. HTN - his blood pressure is low today. I have asked him to reduce his dose of toprol to 100 mg daily. 2. Atrial fib - he has been instructed to stop taking his digoxin.  3. PPM - his medtronic VVI PM is working normally. 4. Diastolic heart failure - his symptoms are well controlled. He is encouraged to maintain  a low sodium diet.  Mikle Bosworth.D.

## 2017-05-10 NOTE — Patient Instructions (Addendum)
Medication Instructions:  Your physician has recommended you make the following change in your medication:  1.  Stop taking digoxin. 2.  Stop taking Toprol XL 50 mg.  Continue taking your Toprol XL 100 mg twice a day.  If your sleepiness improves continue just taking Toprol XL 100 mg twice a day.  Call if you have any issues.  Labwork: None ordered.   Testing/Procedures: None ordered.  Follow-Up: Your physician wants you to follow-up in: one year with [name].   You will receive a reminder letter in the mail two months in advance. If you don't receive a letter, please call our office to schedule the follow-up appointment.  Remote monitoring is used to monitor your Pacemaker from home. This monitoring reduces the number of office visits required to check your device to one time per year. It allows [place] to keep an eye on the functioning of your device to ensure it is working properly. You are scheduled for a device check from home on 08/08/2017. You may send your transmission at any time that day. If you have a wireless device, the transmission will be sent automatically. After your physician reviews your transmission, you will receive a postcard with your next transmission date.  Any Other Special Instructions Will Be Listed Below (If Applicable).  If you need a refill on your cardiac medications before your next appointment, please call your pharmacy.

## 2017-05-12 ENCOUNTER — Other Ambulatory Visit: Payer: Self-pay | Admitting: Physician Assistant

## 2017-05-14 NOTE — Telephone Encounter (Signed)
Refill appropriate 

## 2017-05-17 ENCOUNTER — Other Ambulatory Visit: Payer: Self-pay | Admitting: Physician Assistant

## 2017-05-17 NOTE — Telephone Encounter (Signed)
Refill appropriate 

## 2017-05-18 ENCOUNTER — Other Ambulatory Visit: Payer: Self-pay | Admitting: Physician Assistant

## 2017-05-18 DIAGNOSIS — E1142 Type 2 diabetes mellitus with diabetic polyneuropathy: Secondary | ICD-10-CM

## 2017-05-18 NOTE — Telephone Encounter (Signed)
Refill appropriate 

## 2017-05-21 ENCOUNTER — Telehealth: Payer: Self-pay | Admitting: Internal Medicine

## 2017-05-21 NOTE — Telephone Encounter (Signed)
Call returned to Pt.  Pt dizzy with low blood pressure.  Pt has taken all his AM HTN medications today:  Amlodipine 5 mg, furosemide 20 mg, lisinopril 20 mg, Toprol XL 100 mg, Imdur 30 mg.  Pt's only nightime HTN medicine is Toprol XL 100 mg.  Asked Pt to hold bedtime Toprol XL 100 mg and eat some salty foods and increase fluids today.  Asked Pt to repeat BP in the AM.  May take all his normal morning medications, asked to hold his Toprol XL 100 mg until this nurse speaks with [name].  Pt indicates understanding.  Will call tomorrow morning.

## 2017-05-21 NOTE — Telephone Encounter (Signed)
Pt c/o BP issue: STAT if pt c/o blurred vision, one-sided weakness or slurred speech  1. What are your last 5 BP readings? 70/49 hr 105  2. Are you having any other symptoms (ex. Dizziness, headache, blurred vision, passed out)? Dizziness   3. What is your BP issue? Low BP

## 2017-05-22 LAB — CUP PACEART REMOTE DEVICE CHECK
Battery Impedance: 228 Ohm
Battery Remaining Longevity: 136 mo
Battery Voltage: 2.8 V
Brady Statistic RV Percent Paced: 100 %
Date Time Interrogation Session: 20190123151037
Implantable Lead Implant Date: 20141010
Implantable Lead Implant Date: 20141010
Implantable Lead Location: 753859
Implantable Lead Location: 753860
Implantable Lead Model: 5076
Implantable Lead Model: 5076
Implantable Pulse Generator Implant Date: 20141010
Lead Channel Impedance Value: 67 Ohm
Lead Channel Impedance Value: 686 Ohm
Lead Channel Pacing Threshold Amplitude: 0.75 V
Lead Channel Pacing Threshold Pulse Width: 0.4 ms
Lead Channel Setting Pacing Amplitude: 2 V
Lead Channel Setting Pacing Pulse Width: 0.4 ms
Lead Channel Setting Sensing Sensitivity: 4 mV

## 2017-05-22 MED ORDER — METOPROLOL SUCCINATE ER 50 MG PO TB24: 50.0000 mg | ORAL_TABLET | Freq: Every day | ORAL | 3 refills | Status: DC

## 2017-05-22 NOTE — Telephone Encounter (Signed)
Call returned to Pt.  Pt AM BP today was 90/57 with HR of 95 after taking all AM blood pressure medications except for Toprol XL. Verbal orders received from [name]:  DC amlodipine, DC lisinopril.   Reduce Toprol XL to 50 mg one tab by mouth daily.  Take first dose of Toprol XL 50 mg after lunch today.  Then once daily.  Pt indicates understanding.  Will call if any further issues.

## 2017-05-25 ENCOUNTER — Other Ambulatory Visit: Payer: Self-pay | Admitting: Physician Assistant

## 2017-05-28 NOTE — Telephone Encounter (Signed)
Refill appropriate 

## 2017-05-31 ENCOUNTER — Encounter: Payer: Self-pay | Admitting: Physician Assistant

## 2017-05-31 ENCOUNTER — Ambulatory Visit (INDEPENDENT_AMBULATORY_CARE_PROVIDER_SITE_OTHER): Payer: Medicare HMO | Admitting: Physician Assistant

## 2017-05-31 ENCOUNTER — Other Ambulatory Visit: Payer: Self-pay

## 2017-05-31 VITALS — BP 136/84 | HR 79 | Temp 97.7°F | Resp 14 | Ht 72.0 in | Wt 227.4 lb

## 2017-05-31 DIAGNOSIS — Z7901 Long term (current) use of anticoagulants: Secondary | ICD-10-CM | POA: Diagnosis not present

## 2017-05-31 LAB — PT WITH INR/FINGERSTICK
INR, fingerstick: 3.2 ratio — ABNORMAL HIGH
PT, fingerstick: 38.7 s — ABNORMAL HIGH (ref 10.5–13.1)

## 2017-05-31 NOTE — Progress Notes (Signed)
Patient ID: [name] Cook MRN: [id number], DOB: [birth year], 67 y.o. Date of Encounter: 05/31/2017, 12:54 PM    Chief Complaint:  Chief Complaint  Patient presents with  . 1 month follow up     HPI: 67 y.o. year old male presents with above.   He comes in today for PT/INR check to monitor his Coumadin.  He verifies that he is taking the Coumadin 5 mg tablet--- takes a whole on Mondays, takes a half all other days of the week. He has had no bleeding.  No skipped doses.  No other concerns to address today.     Home Meds:   Outpatient Medications Prior to Visit  Medication Sig Dispense Refill  . buPROPion (ZYBAN) 150 MG 12 hr tablet TAKE 1 TABLET BY MOUTH TWICE A DAY 180 tablet 1  . celecoxib (CELEBREX) 200 MG capsule TAKE 1 CAPSULE (200 MG TOTAL) BY MOUTH DAILY. 90 capsule 1  . clopidogrel (PLAVIX) 75 MG tablet TAKE 1 TABLET BY MOUTH DAILY 90 tablet 3  . cyclobenzaprine (FLEXERIL) 10 MG tablet Take 1 tablet (10 mg total) by mouth 3 (three) times daily as needed for muscle spasms. 30 tablet 0  . empagliflozin (JARDIANCE) 10 MG TABS tablet TAKE 1 TABLET (10 MG) BY MOUTH EVERY MORNING. 90 tablet 1  . escitalopram (LEXAPRO) 10 MG tablet TAKE 1 TABLET BY MOUTH ONCE DAILY 90 tablet 1  . fish oil-omega-3 fatty acids 1000 MG capsule Take 1 g by mouth daily.      . fluticasone (FLONASE) 50 MCG/ACT nasal spray Place 1 spray into both nostrils daily as needed for allergies or rhinitis.    . furosemide (LASIX) 20 MG tablet TAKE 1 TABLET BY MOUTH DAILY 90 tablet 2  . gabapentin (NEURONTIN) 300 MG capsule TAKE 1 CAPSULE BY MOUTH THREE TIMES A DAY 270 capsule 0  . glucose blood test strip 1 each by Other route daily. Check Blood Sugar fasting each morning 100 each 3  . HYDROcodone-acetaminophen (NORCO/VICODIN) 5-325 MG tablet Take 1 tablet by mouth every 6 (six) hours as needed. 30 tablet 0  . isosorbide mononitrate (IMDUR) 30 MG 24 hr tablet TAKE 1 TABLET BY MOUTH ONCE DAILY 90 tablet 1  .  metFORMIN (GLUCOPHAGE) 500 MG tablet TAKE 1 TABLET BY MOUTH TWICE DAILY WITH A MEAL 180 tablet 0  . metoprolol succinate (TOPROL-XL) 50 MG 24 hr tablet Take 1 tablet (50 mg total) by mouth daily. Take with or immediately following a meal. 90 tablet 3  . nitroGLYCERIN (NITROSTAT) 0.4 MG SL tablet Place 1 tablet (0.4 mg total) under the tongue every 5 (five) minutes as needed for chest pain. 25 tablet 1  . pantoprazole (PROTONIX) 40 MG tablet TAKE 1 TABLET BY MOUTH ONCE DAILY 90 tablet 3  . promethazine (PHENERGAN) 25 MG tablet Take 1 tablet (25 mg total) by mouth every 8 (eight) hours as needed for nausea or vomiting. 20 tablet 0  . rosuvastatin (CRESTOR) 20 MG tablet TAKE 1 TABLET BY MOUTH BEFORE BEDTIME 90 tablet 1  . sitaGLIPtin (JANUVIA) 100 MG tablet Take 1 tablet (100 mg total) by mouth daily. 90 tablet 3  . warfarin (COUMADIN) 5 MG tablet TAKE 1 TABLET BY MOUTH ON MONDAYS AND 1/2 TABLET ALL OTHER DAYS 90 tablet 3   No facility-administered medications prior to visit.     Allergies: No Known Allergies    Review of Systems: See HPI for pertinent ROS. All other ROS negative.    Physical Exam:  Blood pressure 136/84, pulse 79, temperature 97.7 F (36.5 C), temperature source Oral, resp. rate 14, height 6' (1.829 m), weight 103.1 kg (227 lb 6.4 oz), SpO2 98 %., Body mass index is 30.84 kg/m. General: WNWD WM.  Appears in no acute distress. Neck: Supple. No thyromegaly. No lymphadenopathy. Lungs: Clear bilaterally to auscultation without wheezes, rales, or rhonchi. Breathing is unlabored. Heart: Irregular rhythm. Msk:  Strength and tone normal for age. Extremities/Skin: Warm and dry.  Neuro: Alert and oriented X 3. Moves all extremities spontaneously. Gait is normal. CNII-XII grossly in tact. Psych:  Responds to questions appropriately with a normal affect.     ASSESSMENT AND PLAN:  67 y.o. year old male with  1. Anticoagulant long-term use INR is 3.2.  Continue current dosing of  Coumadin.  He has a 5 mg tablet.  He takes a whole on Mondays. He only takes a half of a tablet on all other days of the week.  Recheck PT/INR in 2 weeks.  Follow-up sooner if needed. - PT with INR/Fingerstick   Signed, Olean Ree [place], [place], [facility] 05/31/2017 12:54 PM

## 2017-06-05 ENCOUNTER — Other Ambulatory Visit: Payer: Self-pay | Admitting: Internal Medicine

## 2017-06-05 ENCOUNTER — Other Ambulatory Visit: Payer: Self-pay | Admitting: Physician Assistant

## 2017-06-05 NOTE — Telephone Encounter (Signed)
Refill appropriate 

## 2017-06-12 ENCOUNTER — Other Ambulatory Visit: Payer: Self-pay | Admitting: Physician Assistant

## 2017-06-14 ENCOUNTER — Other Ambulatory Visit: Payer: Self-pay | Admitting: Physician Assistant

## 2017-06-14 NOTE — Telephone Encounter (Signed)
Refill appropriate 

## 2017-06-25 ENCOUNTER — Other Ambulatory Visit: Payer: Self-pay | Admitting: Internal Medicine

## 2017-06-26 ENCOUNTER — Ambulatory Visit: Payer: Medicare HMO

## 2017-06-26 ENCOUNTER — Encounter: Payer: Self-pay | Admitting: Primary Care

## 2017-06-26 ENCOUNTER — Ambulatory Visit (INDEPENDENT_AMBULATORY_CARE_PROVIDER_SITE_OTHER): Payer: Medicare HMO | Admitting: Primary Care

## 2017-06-26 DIAGNOSIS — K219 Gastro-esophageal reflux disease without esophagitis: Secondary | ICD-10-CM

## 2017-06-26 DIAGNOSIS — M159 Polyosteoarthritis, unspecified: Secondary | ICD-10-CM

## 2017-06-26 DIAGNOSIS — I4819 Other persistent atrial fibrillation: Secondary | ICD-10-CM

## 2017-06-26 DIAGNOSIS — E785 Hyperlipidemia, unspecified: Secondary | ICD-10-CM | POA: Diagnosis not present

## 2017-06-26 DIAGNOSIS — F329 Major depressive disorder, single episode, unspecified: Secondary | ICD-10-CM

## 2017-06-26 DIAGNOSIS — I10 Essential (primary) hypertension: Secondary | ICD-10-CM

## 2017-06-26 DIAGNOSIS — I481 Persistent atrial fibrillation: Secondary | ICD-10-CM

## 2017-06-26 DIAGNOSIS — Z7901 Long term (current) use of anticoagulants: Secondary | ICD-10-CM | POA: Insufficient documentation

## 2017-06-26 DIAGNOSIS — M8949 Other hypertrophic osteoarthropathy, multiple sites: Secondary | ICD-10-CM

## 2017-06-26 DIAGNOSIS — F32A Depression, unspecified: Secondary | ICD-10-CM

## 2017-06-26 DIAGNOSIS — M1990 Unspecified osteoarthritis, unspecified site: Secondary | ICD-10-CM | POA: Insufficient documentation

## 2017-06-26 DIAGNOSIS — M150 Primary generalized (osteo)arthritis: Secondary | ICD-10-CM

## 2017-06-26 DIAGNOSIS — Z950 Presence of cardiac pacemaker: Secondary | ICD-10-CM | POA: Diagnosis not present

## 2017-06-26 DIAGNOSIS — E119 Type 2 diabetes mellitus without complications: Secondary | ICD-10-CM | POA: Diagnosis not present

## 2017-06-26 DIAGNOSIS — I214 Non-ST elevation (NSTEMI) myocardial infarction: Secondary | ICD-10-CM | POA: Diagnosis not present

## 2017-06-26 LAB — POCT INR: INR: 3.1

## 2017-06-26 NOTE — Assessment & Plan Note (Signed)
Will get set up today for coumadin check.

## 2017-06-26 NOTE — Assessment & Plan Note (Signed)
Stable in the office today, continue current regimen. 

## 2017-06-26 NOTE — Progress Notes (Signed)
Subjective:    Patient ID: [name] Cook, male    DOB: [name] 29, 1951, 67 y.o.   MRN: [id number]  HPI  [name] Cook is a 67 year old male who presents today to establish care and discuss the problems mentioned below. Will obtain old records.   1) Essential Hypertension/CAD/MI: Currently managed on amlodipine 5 mg, isosorbide mononitrate ER 30 mg, metoprolol succinate 50 mg, furosemide 20 mg, nitroglycerin, clopidogrel, rosuvastatin. Currently following with cardiology annually. He denies recent use of nitroglycerin.   BP Readings from Last 3 Encounters:  06/26/17 132/80  05/31/17 136/84  05/10/17 90/60     2) Atrial Fibrillation: Currently managed on clopidogrel, warfarin. Pacemaker inserted in 2014. Currently following with cardiology. Last INR check was 3.2 three weeks ago, he is due for recheck today.   3) Type 2 Diabetes: Currently managed on Jardiance 10 mg, metformin 500 mg BID, Januvia 100 mg. His last A1C was 6.3 in December 2018. Also managed on gabapentin 300 mg TID for neuropathy.   4) Major Depressive Disorder: Currently managed on bupropion 150 mg BID, Lexapro 10 mg. He feels fairly well managed on this regimen.   5) GERD: Currently managed on pantoprazole 40 mg. He's not had a reduction reducing in his dose of pantoprazole before. He has missed a few pills in the past without experiencing heartburn symptoms. He is managed on promethazine for which he takes infrequently for GI upset, nausea.   6) Osteoarthritis: Located mostly in the knees. He is taking celebrex 200 mg once daily with improvement.    Review of Systems  Respiratory: Negative for shortness of breath.   Cardiovascular: Negative for chest pain.  Gastrointestinal:       Denies GERD symptoms  Musculoskeletal: Positive for arthralgias.  Neurological: Negative for dizziness.  Psychiatric/Behavioral:       Overall feels well managed on regimen       Past Medical History:  Diagnosis Date  . Arthritis    "knees  and lower back" (03/14/2013)  . Atrial flutter ([place])    radiofrequency ablation in 2001  . CAD (coronary artery disease)    a. Nonobstructive. Cardiac cath in 2001-50% mid RI, normal LM, LAD, RCA b. cath 10/16/2014 95% mid RCA treated with DES, 99% ost D1 medical management due to small aneurysmal segment  . Chronic anticoagulation    chronic Coumadin anticoagulation  . Chronic obstructive pulmonary disease ([place]) 04/20/2011  . Diabetes mellitus, type 2 ([place])   . GERD (gastroesophageal reflux disease)   . Hyperlipidemia   . Hypertension    with hypertensive heart disease  . Obesity   . Persistent atrial fibrillation ([facility])    recurrent atrial flutter since 2001 s/p DCCVs, multiple failed AADs, h/o tachy-mediated cardiomyopathy  . Shortness of breath    "can come on at any time" (03/14/2013)  . Sleep apnea    "dx'd; couldn't wear the mask" (03/14/2013)  . Tobacco abuse      Social History   Socioeconomic History  . Marital status: Legally Separated    Spouse name: Not on file  . Number of children: 1  . Years of education: Not on file  . Highest education level: Not on file  Social Needs  . Financial resource strain: Not on file  . Food insecurity - worry: Not on file  . Food insecurity - inability: Not on file  . Transportation needs - medical: Not on file  . Transportation needs - non-medical: Not on file  Occupational History  .  Occupation: Merchandiser, retail: UNEMPLOYED  Tobacco Use  . Smoking status: Former Smoker    Packs/day: 1.00    Years: 42.00    Pack years: 42.00    Types: Cigarettes    Last attempt to quit: 12/31/2011    Years since quitting: 5.4  . Smokeless tobacco: Never Used  Substance and Sexual Activity  . Alcohol use: Yes    Comment: 03/14/2013 "stopped drinking back in 2002; never had problem w/it"  . Drug use: No  . Sexual activity: Not Currently  Other Topics Concern  . Not on file  Social History Narrative   Single.   Retired.    1 son,  deceased.    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   Enjoys playing on the computer.        Past Surgical History:  Procedure Laterality Date  . ATRIAL FLUTTER ABLATION  2002   atrial flutter; subsequently developed atrial fibrillation  . AV NODE ABLATION  01/24/2013  . CARDIAC CATHETERIZATION  2002  . CARDIAC CATHETERIZATION N/A 10/16/2014   Procedure: Left Heart Cath and Coronary Angiography;  Surgeon: [name], MD;  Location: [facility];  Service: Cardiovascular;  Laterality: N/A;  . CARDIOVERSION  05/31/2011   Procedure: CARDIOVERSION;  Surgeon: Cristopher Estimable. Lattie Haw, MD;  Location: [facility];  Service: Cardiovascular;  Laterality: N/A;  . CARPAL TUNNEL RELEASE Left 1980's  . INSERT / REPLACE / REMOVE PACEMAKER  01/24/2013    Medtronic Adapta L dual-chamber pacemaker, serial number [id number]   . LEFT HEART CATHETERIZATION WITH CORONARY ANGIOGRAM N/A 03/17/2013   Procedure: LEFT HEART CATHETERIZATION WITH CORONARY ANGIOGRAM;  Surgeon: Burnell Blanks, MD; LAD mild dz, D1 branch 100%, inferior branch 99%, CFX OK, RCA 50%, EF 65%    . LOOP RECORDER IMPLANT  2002  . PERMANENT PACEMAKER INSERTION N/A 01/24/2013   Procedure: PERMANENT PACEMAKER INSERTION;  Surgeon: [name], MD;  Location: [facility];  Service: Cardiovascular;  Laterality: N/A;  . TIBIAL TUBERCLERPLASTY  ~ 2003    Family History  Problem Relation Age of Onset  . Alzheimer's disease Mother   . Osteoporosis Mother     No Known Allergies  Current Outpatient Medications on File Prior to Visit  Medication Sig Dispense Refill  . amLODipine (NORVASC) 5 MG tablet TAKE 1 TABLET BY MOUTH EVERY DAY 90 tablet 1  . buPROPion (ZYBAN) 150 MG 12 hr tablet TAKE 1 TABLET BY MOUTH TWICE A DAY 180 tablet 1  . celecoxib (CELEBREX) 200 MG capsule TAKE 1 CAPSULE BY MOUTH EVERY DAY 90 capsule 1  . clopidogrel (PLAVIX) 75 MG tablet TAKE 1 TABLET BY MOUTH DAILY 90 tablet 3    . empagliflozin (JARDIANCE) 10 MG TABS tablet TAKE 1 TABLET (10 MG) BY MOUTH EVERY MORNING. 90 tablet 1  . escitalopram (LEXAPRO) 10 MG tablet TAKE 1 TABLET BY MOUTH ONCE DAILY 90 tablet 1  . fish oil-omega-3 fatty acids 1000 MG capsule Take 1 g by mouth daily.      . fluticasone (FLONASE) 50 MCG/ACT nasal spray Place 1 spray into both nostrils daily as needed for allergies or rhinitis.    . furosemide (LASIX) 20 MG tablet TAKE 1 TABLET BY MOUTH DAILY 90 tablet 2  . gabapentin (NEURONTIN) 300 MG capsule TAKE 1 CAPSULE BY MOUTH THREE TIMES A DAY 270 capsule 0  . isosorbide mononitrate (IMDUR) 30 MG 24 hr tablet TAKE 1 TABLET BY MOUTH ONCE DAILY  90 tablet 1  . metFORMIN (GLUCOPHAGE) 500 MG tablet TAKE 1 TABLET BY MOUTH TWICE DAILY WITH A MEAL 180 tablet 0  . metoprolol succinate (TOPROL-XL) 50 MG 24 hr tablet Take 1 tablet (50 mg total) by mouth daily. Take with or immediately following a meal. 90 tablet 3  . pantoprazole (PROTONIX) 40 MG tablet TAKE 1 TABLET BY MOUTH ONCE DAILY 90 tablet 3  . rosuvastatin (CRESTOR) 20 MG tablet TAKE 1 TABLET BY MOUTH BEFORE BEDTIME 90 tablet 1  . sitaGLIPtin (JANUVIA) 100 MG tablet Take 1 tablet (100 mg total) by mouth daily. 90 tablet 3  . warfarin (COUMADIN) 5 MG tablet TAKE 1 TABLET BY MOUTH ON MONDAYS AND 1/2 TABLET ALL OTHER DAYS 90 tablet 3  . glucose blood test strip 1 each by Other route daily. Check Blood Sugar fasting each morning (Patient not taking: Reported on 06/26/2017) 100 each 3  . nitroGLYCERIN (NITROSTAT) 0.4 MG SL tablet Place 1 tablet (0.4 mg total) under the tongue every 5 (five) minutes as needed for chest pain. (Patient not taking: Reported on 06/26/2017) 25 tablet 1  . promethazine (PHENERGAN) 25 MG tablet Take 1 tablet (25 mg total) by mouth every 8 (eight) hours as needed for nausea or vomiting. (Patient not taking: Reported on 06/26/2017) 20 tablet 0   No current facility-administered medications on file prior to visit.     BP 132/80    Pulse 95   Temp 98 F (36.7 C) (Oral)   Ht 5\' 11"  (1.803 m)   Wt 225 lb 12 oz (102.4 kg)   SpO2 97%   BMI 31.49 kg/m    Objective:   Physical Exam  Constitutional: He is oriented to person, place, and time. He appears well-nourished.  Neck: Neck supple.  Cardiovascular: Normal rate and regular rhythm.  Pulmonary/Chest: Effort normal and breath sounds normal. He has no wheezes. He has no rales.  Neurological: He is alert and oriented to person, place, and time.  Skin: Skin is warm and dry.  Psychiatric: He has a normal mood and affect.          Assessment & Plan:

## 2017-06-26 NOTE — Assessment & Plan Note (Signed)
Following with cardiology. Continue Imdur, clopidogrel, BP and lipid control. No recent use of nitroglycerin.

## 2017-06-26 NOTE — Patient Instructions (Signed)
NR today 3.1  *New patient today - has been on 2.5mg  daily EXCEPT for 5mg  on Mondays.  Last 2 readings have been elevated so will need dosage reduction today.    Patient is to eat greens today and tomorrow only and then decrease weekly dosing to taking 2.5mg  Daily, recheck in 2 weeks.  He verbalizes understanding of all instructions given today.    Patient denies any unusual bruising or bleeding and is aware of risks associated with supratherapeutic reading and will go toER if any concerns develop.

## 2017-06-26 NOTE — Assessment & Plan Note (Signed)
Doing well with celebrex 200 mg daily. Continue to monitor.

## 2017-06-26 NOTE — Telephone Encounter (Signed)
Assess/Plan: 1. HTN - his blood pressure is low today. I have asked him to reduce his dose of toprol to 100 mg daily. 2. Atrial fib - he has been instructed to stop taking his digoxin.  3. PPM - his medtronic VVI PM is working normally. 4. Diastolic heart failure - his symptoms are well controlled. He is encouraged to maintain a low sodium diet.  Gregg Taylor,M.D.    Patient Instructions by Damian Leavell, RN at 05/10/2017 11:30 AM   Author: Damian Leavell, RN Author Type: Registered Nurse Filed: 05/10/2017 11:54 AM  Note Status: Addendum Cosign: Cosign Not Required Encounter Date: 05/10/2017  Editor: Damian Leavell, RN (Registered Nurse)  Prior Versions: 1. Damian Leavell, RN (Registered Nurse) at 05/10/2017 11:54 AM - Addendum   2. Damian Leavell, RN (Registered Nurse) at 05/10/2017 11:42 AM - Signed    Medication Instructions:  Your physician has recommended you make the following change in your medication:  1.  Stop taking digoxin.

## 2017-06-26 NOTE — Assessment & Plan Note (Signed)
Well controlled on pantoprazole 40 mg, consider reducing to 20 mg dose during next refill.

## 2017-06-26 NOTE — Assessment & Plan Note (Signed)
Rate regular in the office today. Pacemaker inserted in 2014. Will get set up with coumadin clinic in our office today. Last INR of 3.2 2 weeks ago.

## 2017-06-26 NOTE — Assessment & Plan Note (Signed)
Well controlled on current regimen. Repeat A1C in 3 months. Continue medications as prescribed.

## 2017-06-26 NOTE — Assessment & Plan Note (Signed)
Following with cardiology, remote check due in April 2019.

## 2017-06-26 NOTE — Assessment & Plan Note (Signed)
Stable on labs from December 2018, continue rosuvastatin 20 mg. Repeat lipids in 3 months.

## 2017-06-26 NOTE — Assessment & Plan Note (Signed)
Feels stable on current regimen. Denies SI/HI. Continue current regimen.

## 2017-06-26 NOTE — Patient Instructions (Signed)
Stop of front to get connected with [facility] for your coumadin check.  Please schedule a Medicare Wellness Visit with our nurse and then a physical with me in 3 months.   It was a pleasure to meet you today! Please don't hesitate to call or message me with any questions. Welcome to [facility]!

## 2017-07-11 ENCOUNTER — Other Ambulatory Visit: Payer: Self-pay | Admitting: Physician Assistant

## 2017-07-12 ENCOUNTER — Ambulatory Visit (INDEPENDENT_AMBULATORY_CARE_PROVIDER_SITE_OTHER): Payer: Medicare HMO | Admitting: General Practice

## 2017-07-12 ENCOUNTER — Other Ambulatory Visit: Payer: Self-pay | Admitting: General Practice

## 2017-07-12 DIAGNOSIS — Z7901 Long term (current) use of anticoagulants: Secondary | ICD-10-CM

## 2017-07-12 DIAGNOSIS — I4819 Other persistent atrial fibrillation: Secondary | ICD-10-CM

## 2017-07-12 DIAGNOSIS — I481 Persistent atrial fibrillation: Secondary | ICD-10-CM | POA: Diagnosis not present

## 2017-07-12 LAB — POCT INR: INR: 1.7

## 2017-07-12 MED ORDER — WARFARIN SODIUM 1 MG PO TABS: ORAL_TABLET | ORAL | 0 refills | Status: DC

## 2017-07-12 NOTE — Patient Instructions (Addendum)
Pre visit review using our clinic review tool, if applicable. No additional management support is needed unless otherwise documented below in the visit note.  Take 3.5 mg today and then change dosage and take 2.5 mg all days except 3.5 mg on Mondays.  Re-check in 2 weeks.

## 2017-07-26 ENCOUNTER — Ambulatory Visit: Payer: Medicare HMO

## 2017-07-26 ENCOUNTER — Ambulatory Visit (INDEPENDENT_AMBULATORY_CARE_PROVIDER_SITE_OTHER): Payer: Medicare Other | Admitting: General Practice

## 2017-07-26 DIAGNOSIS — I481 Persistent atrial fibrillation: Secondary | ICD-10-CM

## 2017-07-26 DIAGNOSIS — Z7901 Long term (current) use of anticoagulants: Secondary | ICD-10-CM | POA: Diagnosis not present

## 2017-07-26 DIAGNOSIS — I4819 Other persistent atrial fibrillation: Secondary | ICD-10-CM

## 2017-07-26 LAB — POCT INR: INR: 2.7

## 2017-07-26 NOTE — Patient Instructions (Signed)
Pre visit review using our clinic review tool, if applicable. No additional management support is needed unless otherwise documented below in the visit note. Continue to take 2.5 mg all days except 3.5 mg on Mondays.  Re-check in 4 weeks.    

## 2017-08-07 ENCOUNTER — Other Ambulatory Visit: Payer: Self-pay | Admitting: Primary Care

## 2017-08-07 DIAGNOSIS — I4819 Other persistent atrial fibrillation: Secondary | ICD-10-CM

## 2017-08-07 DIAGNOSIS — Z7901 Long term (current) use of anticoagulants: Secondary | ICD-10-CM

## 2017-08-07 NOTE — Telephone Encounter (Signed)
Ok to refill? Electronically refill request for warfarin (COUMADIN) 1 MG tablet  Last prescribed on 07/12/2017. Last seen by Anda Kraft on 06/26/2017. Last coumadin appt on 07/26/2017.

## 2017-08-08 ENCOUNTER — Ambulatory Visit (INDEPENDENT_AMBULATORY_CARE_PROVIDER_SITE_OTHER): Payer: Medicare Other | Admitting: *Deleted

## 2017-08-08 DIAGNOSIS — I495 Sick sinus syndrome: Secondary | ICD-10-CM | POA: Diagnosis not present

## 2017-08-08 NOTE — Telephone Encounter (Signed)
Refill sent to pharmacy. Following with Coumadin clinic.

## 2017-08-08 NOTE — Progress Notes (Signed)
Remote pacemaker transmission.   

## 2017-08-09 LAB — CUP PACEART REMOTE DEVICE CHECK
Battery Impedance: 228 Ohm
Battery Remaining Longevity: 121 mo
Battery Voltage: 2.8 V
Brady Statistic RV Percent Paced: 100 %
Date Time Interrogation Session: 20190424131510
Implantable Lead Implant Date: 20141010
Implantable Lead Implant Date: 20141010
Implantable Lead Location: 753859
Implantable Lead Location: 753860
Implantable Lead Model: 5076
Implantable Lead Model: 5076
Implantable Pulse Generator Implant Date: 20141010
Lead Channel Impedance Value: 663 Ohm
Lead Channel Impedance Value: 67 Ohm
Lead Channel Pacing Threshold Amplitude: 0.75 V
Lead Channel Pacing Threshold Pulse Width: 0.4 ms
Lead Channel Setting Pacing Amplitude: 2.5 V
Lead Channel Setting Pacing Pulse Width: 0.4 ms
Lead Channel Setting Sensing Sensitivity: 4 mV

## 2017-08-10 ENCOUNTER — Encounter: Payer: Self-pay | Admitting: Cardiology

## 2017-08-10 ENCOUNTER — Ambulatory Visit (INDEPENDENT_AMBULATORY_CARE_PROVIDER_SITE_OTHER): Payer: Medicare Other | Admitting: Internal Medicine

## 2017-08-10 ENCOUNTER — Encounter: Payer: Self-pay | Admitting: Internal Medicine

## 2017-08-10 VITALS — BP 100/60 | HR 88 | Temp 97.2°F | Ht 71.0 in | Wt 224.0 lb

## 2017-08-10 DIAGNOSIS — J069 Acute upper respiratory infection, unspecified: Secondary | ICD-10-CM | POA: Insufficient documentation

## 2017-08-10 NOTE — Patient Instructions (Signed)
Please restart the flonase and let me know next week if you are getting worse, instead of better.

## 2017-08-10 NOTE — Progress Notes (Signed)
Subjective:    Patient ID: [name] Cook, male    DOB: 1950/08/14, 67 y.o.   MRN: [id number]  HPI Here due to respiratory symptoms  Having head congestion and is hoarseness Started 2 days ago Some cough attacks---several cough (day and night) No sore throat or ear pain No fever Gets hot at night with sweat. No chills Slight SOB  Hasn't taken anything  Current Outpatient Medications on File Prior to Visit  Medication Sig Dispense Refill  . amLODipine (NORVASC) 5 MG tablet TAKE 1 TABLET BY MOUTH EVERY DAY 90 tablet 1  . celecoxib (CELEBREX) 200 MG capsule TAKE 1 CAPSULE BY MOUTH EVERY DAY 90 capsule 1  . clopidogrel (PLAVIX) 75 MG tablet TAKE 1 TABLET BY MOUTH DAILY 90 tablet 3  . empagliflozin (JARDIANCE) 10 MG TABS tablet TAKE 1 TABLET (10 MG) BY MOUTH EVERY MORNING. 90 tablet 1  . escitalopram (LEXAPRO) 10 MG tablet TAKE 1 TABLET BY MOUTH EVERY DAY 90 tablet 1  . fish oil-omega-3 fatty acids 1000 MG capsule Take 1 g by mouth daily.      . fluticasone (FLONASE) 50 MCG/ACT nasal spray Place 1 spray into both nostrils daily as needed for allergies or rhinitis.    . furosemide (LASIX) 20 MG tablet TAKE 1 TABLET BY MOUTH DAILY 90 tablet 2  . gabapentin (NEURONTIN) 300 MG capsule TAKE 1 CAPSULE BY MOUTH THREE TIMES A DAY 270 capsule 0  . glucose blood test strip 1 each by Other route daily. Check Blood Sugar fasting each morning 100 each 3  . isosorbide mononitrate (IMDUR) 30 MG 24 hr tablet TAKE 1 TABLET BY MOUTH EVERY DAY 90 tablet 1  . metFORMIN (GLUCOPHAGE) 500 MG tablet TAKE 1 TABLET BY MOUTH TWICE DAILY WITH A MEAL 180 tablet 0  . metoprolol succinate (TOPROL-XL) 50 MG 24 hr tablet Take 1 tablet (50 mg total) by mouth daily. Take with or immediately following a meal. 90 tablet 3  . nitroGLYCERIN (NITROSTAT) 0.4 MG SL tablet Place 1 tablet (0.4 mg total) under the tongue every 5 (five) minutes as needed for chest pain. 25 tablet 1  . pantoprazole (PROTONIX) 40 MG tablet TAKE 1 TABLET  BY MOUTH ONCE DAILY 90 tablet 3  . promethazine (PHENERGAN) 25 MG tablet Take 1 tablet (25 mg total) by mouth every 8 (eight) hours as needed for nausea or vomiting. 20 tablet 0  . rosuvastatin (CRESTOR) 20 MG tablet TAKE 1 TABLET BY MOUTH BEFORE BEDTIME 90 tablet 1  . sitaGLIPtin (JANUVIA) 100 MG tablet Take 1 tablet (100 mg total) by mouth daily. 90 tablet 3  . warfarin (COUMADIN) 1 MG tablet TAKE 1 TABLET ON MONDAYS. PT. USES 1 MG TABLETS IN ADDITION TO 5 MG TABLETS. 90 tablet 0  . warfarin (COUMADIN) 5 MG tablet TAKE 1 TABLET BY MOUTH ON MONDAYS AND 1/2 TABLET ALL OTHER DAYS 90 tablet 3   No current facility-administered medications on file prior to visit.     No Known Allergies  Past Medical History:  Diagnosis Date  . Arthritis    "knees and lower back" (03/14/2013)  . Atrial flutter ([place])    radiofrequency ablation in 2001  . CAD (coronary artery disease)    a. Nonobstructive. Cardiac cath in 2001-50% mid RI, normal LM, LAD, RCA b. cath 10/16/2014 95% mid RCA treated with DES, 99% ost D1 medical management due to small aneurysmal segment  . Chronic anticoagulation    chronic Coumadin anticoagulation  . Chronic obstructive pulmonary  disease ([place]) 04/20/2011  . Diabetes mellitus, type 2 ([place])   . GERD (gastroesophageal reflux disease)   . Hyperlipidemia   . Hypertension    with hypertensive heart disease  . Obesity   . Persistent atrial fibrillation (HCC)    recurrent atrial flutter since 2001 s/p DCCVs, multiple failed AADs, h/o tachy-mediated cardiomyopathy  . Shortness of breath    "can come on at any time" (03/14/2013)  . Sleep apnea    "dx'd; couldn't wear the mask" (03/14/2013)  . Tobacco abuse     Past Surgical History:  Procedure Laterality Date  . ATRIAL FLUTTER ABLATION  2002   atrial flutter; subsequently developed atrial fibrillation  . AV NODE ABLATION  01/24/2013  . CARDIAC CATHETERIZATION  2002  . CARDIAC CATHETERIZATION N/A 10/16/2014   Procedure: Left  Heart Cath and Coronary Angiography;  Surgeon: [name], MD;  Location: [place] [facility];  Service: Cardiovascular;  Laterality: N/A;  . CARDIOVERSION  05/31/2011   Procedure: CARDIOVERSION;  Surgeon: Cristopher Estimable. Lattie Haw, MD;  [facility];  Service: Cardiovascular;  Laterality: N/A;  . CARPAL TUNNEL RELEASE Left 1980's  . INSERT / REPLACE / REMOVE PACEMAKER  01/24/2013    Medtronic Adapta L dual-chamber pacemaker, serial number [id number]   . LEFT HEART CATHETERIZATION WITH CORONARY ANGIOGRAM N/A 03/17/2013   Procedure: LEFT HEART CATHETERIZATION WITH CORONARY ANGIOGRAM;  Surgeon: Burnell Blanks, MD; LAD mild dz, D1 branch 100%, inferior branch 99%, CFX OK, RCA 50%, EF 65%    . LOOP RECORDER IMPLANT  2002  . PERMANENT PACEMAKER INSERTION N/A 01/24/2013   Procedure: PERMANENT PACEMAKER INSERTION;  Surgeon: [name], MD;  Location: [facility] C[facility] LAB;  Service: Cardiovascular;  Laterality: N/A;  . TIBIAL TUBERCLERPLASTY  ~ 2003    Family History  Problem Relation Age of Onset  . Alzheimer's disease Mother   . Osteoporosis Mother     Social History   Socioeconomic History  . Marital status: Legally Separated    Spouse name: Not on file  . Number of children: 1  . Years of education: Not on file  . Highest education level: Not on file  Occupational History  . Occupation: Merchandiser, retail: UNEMPLOYED  Social Needs  . Financial resource strain: Not on file  . Food insecurity:    Worry: Not on file    Inability: Not on file  . Transportation needs:    Medical: Not on file    Non-medical: Not on file  Tobacco Use  . Smoking status: Former Smoker    Packs/day: 1.00    Years: 42.00    Pack years: 42.00    Types: Cigarettes    Last attempt to quit: 12/31/2011    Years since quitting: 5.6  . Smokeless tobacco: Never Used  Substance and Sexual Activity  . Alcohol use: Yes    Comment: 03/14/2013 "stopped drinking back in 2002; never had problem w/it"  .  Drug use: No  . Sexual activity: Not Currently  Lifestyle  . Physical activity:    Days per week: Not on file    Minutes per session: Not on file  . Stress: Not on file  Relationships  . Social connections:    Talks on phone: Not on file    Gets together: Not on file    Attends religious service: Not on file    Active member of club or organization: Not on file    Attends meetings of clubs or organizations: Not  on file    Relationship status: Not on file  . Intimate partner violence:    Fear of current or ex partner: Not on file    Emotionally abused: Not on file    Physically abused: Not on file    Forced sexual activity: Not on file  Other Topics Concern  . Not on file  Social History Narrative   Single.   Retired.    1 son, deceased.    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   Enjoys playing on the computer.       Review of Systems Some intermittent maxillary headache No regular allergy problems Has used flonase in the past --helps congestion No vomiting or diarrhea Appetite is okay    Objective:   Physical Exam  Constitutional: He appears well-developed. No distress.  HENT:  Slight maxillary tenderness Moderate nasal inflammation TMs okay Mild injection of uvula--no exudates  Neck: No thyromegaly present.  Lymphadenopathy:    He has no cervical adenopathy.          Assessment & Plan:

## 2017-08-10 NOTE — Assessment & Plan Note (Signed)
Discussed viral etiology (he asked about antibiotics) Some sinus involvement---- asked him to restart his flonase If worsens next week, would send amoxil Rx empirically

## 2017-08-12 ENCOUNTER — Other Ambulatory Visit: Payer: Self-pay | Admitting: Physician Assistant

## 2017-08-13 ENCOUNTER — Telehealth: Payer: Self-pay | Admitting: Primary Care

## 2017-08-13 MED ORDER — AMOXICILLIN 500 MG PO TABS: 1000.0000 mg | ORAL_TABLET | Freq: Two times a day (BID) | ORAL | 0 refills | Status: AC

## 2017-08-13 NOTE — Telephone Encounter (Signed)
Spoke to pt

## 2017-08-13 NOTE — Telephone Encounter (Signed)
Please let him know I sent a prescription [facility] an antibiotic [facility] here

## 2017-08-13 NOTE — Telephone Encounter (Signed)
Copied from [place] [phone]. Topic: Quick Communication - See Telephone Encounter >> Aug 13, 2017 11:30 AM [name] Cook wrote: CRM for notification. See Telephone encounter for: 08/13/17. Pt called in and said that he was seen Friday.  Dr Silvio Pate told him to call back if he felt worse.  Pt stated that he feels worse and it is now in his chest.  He has been up all night coughing    [facility] [place]

## 2017-08-20 ENCOUNTER — Other Ambulatory Visit: Payer: Self-pay | Admitting: Physician Assistant

## 2017-08-20 DIAGNOSIS — E1142 Type 2 diabetes mellitus with diabetic polyneuropathy: Secondary | ICD-10-CM

## 2017-08-23 ENCOUNTER — Ambulatory Visit (INDEPENDENT_AMBULATORY_CARE_PROVIDER_SITE_OTHER): Payer: Medicare Other | Admitting: General Practice

## 2017-08-23 DIAGNOSIS — I481 Persistent atrial fibrillation: Secondary | ICD-10-CM

## 2017-08-23 DIAGNOSIS — Z7901 Long term (current) use of anticoagulants: Secondary | ICD-10-CM | POA: Diagnosis not present

## 2017-08-23 DIAGNOSIS — I4819 Other persistent atrial fibrillation: Secondary | ICD-10-CM

## 2017-08-23 LAB — POCT INR: INR: 2.7

## 2017-08-23 NOTE — Patient Instructions (Addendum)
Pre visit review using our clinic review tool, if applicable. No additional management support is needed unless otherwise documented below in the visit note. Continue to take 2.5 mg all days except 3.5 mg on Mondays.  Re-check in 4 weeks.    

## 2017-09-18 ENCOUNTER — Other Ambulatory Visit: Payer: Self-pay | Admitting: Primary Care

## 2017-09-18 DIAGNOSIS — E119 Type 2 diabetes mellitus without complications: Secondary | ICD-10-CM

## 2017-09-18 DIAGNOSIS — Z125 Encounter for screening for malignant neoplasm of prostate: Secondary | ICD-10-CM

## 2017-09-18 DIAGNOSIS — E7800 Pure hypercholesterolemia, unspecified: Secondary | ICD-10-CM

## 2017-09-18 DIAGNOSIS — Z794 Long term (current) use of insulin: Principal | ICD-10-CM

## 2017-09-18 DIAGNOSIS — I10 Essential (primary) hypertension: Secondary | ICD-10-CM

## 2017-09-20 ENCOUNTER — Ambulatory Visit (INDEPENDENT_AMBULATORY_CARE_PROVIDER_SITE_OTHER): Payer: Medicare Other | Admitting: General Practice

## 2017-09-20 DIAGNOSIS — Z7901 Long term (current) use of anticoagulants: Secondary | ICD-10-CM

## 2017-09-20 DIAGNOSIS — I4819 Other persistent atrial fibrillation: Secondary | ICD-10-CM

## 2017-09-20 DIAGNOSIS — I481 Persistent atrial fibrillation: Secondary | ICD-10-CM

## 2017-09-20 LAB — POCT INR: INR: 2.8 (ref 2.0–3.0)

## 2017-09-20 NOTE — Patient Instructions (Addendum)
Pre visit review using our clinic review tool, if applicable. No additional management support is needed unless otherwise documented below in the visit note.  Continue to take 2.5 mg all days except 3.5 mg on Mondays.  Re-check in 6 weeks.  

## 2017-09-21 ENCOUNTER — Ambulatory Visit (INDEPENDENT_AMBULATORY_CARE_PROVIDER_SITE_OTHER): Payer: Medicare Other

## 2017-09-21 VITALS — BP 110/84 | HR 98 | Temp 97.5°F | Ht 71.0 in | Wt 226.5 lb

## 2017-09-21 DIAGNOSIS — I10 Essential (primary) hypertension: Secondary | ICD-10-CM | POA: Diagnosis not present

## 2017-09-21 DIAGNOSIS — Z0000 Encounter for general adult medical examination without abnormal findings: Secondary | ICD-10-CM

## 2017-09-21 DIAGNOSIS — E119 Type 2 diabetes mellitus without complications: Secondary | ICD-10-CM | POA: Diagnosis not present

## 2017-09-21 DIAGNOSIS — Z23 Encounter for immunization: Secondary | ICD-10-CM

## 2017-09-21 DIAGNOSIS — Z794 Long term (current) use of insulin: Secondary | ICD-10-CM | POA: Diagnosis not present

## 2017-09-21 DIAGNOSIS — Z125 Encounter for screening for malignant neoplasm of prostate: Secondary | ICD-10-CM | POA: Diagnosis not present

## 2017-09-21 LAB — MICROALBUMIN / CREATININE URINE RATIO
Creatinine,U: 31 mg/dL
Microalb Creat Ratio: 2.3 mg/g (ref 0.0–30.0)
Microalb, Ur: <0.7 mg/dL (ref 0.0–1.9)

## 2017-09-21 LAB — COMPREHENSIVE METABOLIC PANEL
ALT: 21 U/L (ref 0–53)
AST: 20 U/L (ref 0–37)
Albumin: 4.3 g/dL (ref 3.5–5.2)
Alkaline Phosphatase: 52 U/L (ref 39–117)
BUN: 16 mg/dL (ref 6–23)
CO2: 28 mEq/L (ref 19–32)
Calcium: 9.3 mg/dL (ref 8.4–10.5)
Chloride: 106 mEq/L (ref 96–112)
Creatinine, Ser: 1.05 mg/dL (ref 0.40–1.50)
GFR: 75.26 mL/min (ref >60.00)
Glucose, Bld: 113 mg/dL — ABNORMAL HIGH (ref 70–99)
Potassium: 3.9 mEq/L (ref 3.5–5.1)
Sodium: 142 mEq/L (ref 135–145)
Total Bilirubin: 0.6 mg/dL (ref 0.2–1.2)
Total Protein: 6.4 g/dL (ref 6.0–8.3)

## 2017-09-21 LAB — PSA, MEDICARE: PSA: 6.1 ng/ml — ABNORMAL HIGH (ref 0.10–4.00)

## 2017-09-21 LAB — HEMOGLOBIN A1C: Hgb A1c MFr Bld: 6.7 % — ABNORMAL HIGH (ref 4.6–6.5)

## 2017-09-21 NOTE — Progress Notes (Signed)
Subjective:   [name] Cook is a 67 y.o. male who presents for Medicare Annual/Subsequent preventive examination.  Review of Systems:  N/A Cardiac Risk Factors include: advanced age (>98men, >33 women);male gender     Objective:    Vitals: BP 110/84 (BP Location: Right Arm, Patient Position: Sitting, Cuff Size: Normal)   Pulse 98   Temp (!) 97.5 F (36.4 C) (Oral)   Ht 5\' 11"  (1.803 m) Comment: NO SHOES  Wt 226 lb 8 oz (102.7 kg)   SpO2 99%   BMI 31.59 kg/m   Body mass index is 31.59 kg/m.  Advanced Directives 09/21/2017 04/08/2015 10/14/2014 03/14/2013 01/23/2013 01/15/2013 06/06/2011  Does Patient Have a Medical Advance Directive? No No No Patient does not have advance directive;Patient would not like information Patient does not have advance directive;Patient would not like information Patient does not have advance directive;Patient would not like information Patient does not have advance directive;Patient would not like information  Would patient like information on creating a medical advance directive? Yes (MAU/Ambulatory/Procedural Areas - Information given) No - patient declined information No - patient declined information - - - -  Pre-existing out of facility DNR order (yellow form or pink MOST form) - - - - No No -    Tobacco Social History   Tobacco Use  Smoking Status Former Smoker  . Packs/day: 1.00  . Years: 42.00  . Pack years: 42.00  . Types: Cigarettes  . Last attempt to quit: 12/31/2011  . Years since quitting: 5.7  Smokeless Tobacco Never Used     Counseling given: No   Clinical Intake:  Pre-visit preparation completed: Yes  Pain : No/denies pain Pain Score: 0-No pain     Nutritional Status: BMI 25 -29 Overweight Nutritional Risks: None Diabetes: No  What is the last grade level you completed in school?: 9th grade  Interpreter Needed?: No  Comments: pt lives with significant other and her mother Information entered by :: LPinson, LPN  Past  Medical History:  Diagnosis Date  . Arthritis    "knees and lower back" (03/14/2013)  . Atrial flutter ([place])    radiofrequency ablation in 2001  . CAD (coronary artery disease)    a. Nonobstructive. Cardiac cath in 2001-50% mid RI, normal LM, LAD, RCA b. cath 10/16/2014 95% mid RCA treated with DES, 99% ost D1 medical management due to small aneurysmal segment  . Chronic anticoagulation    chronic Coumadin anticoagulation  . Chronic obstructive pulmonary disease ([place]) 04/20/2011  . Diabetes mellitus, type 2 ([place])   . GERD (gastroesophageal reflux disease)   . Hyperlipidemia   . Hypertension    with hypertensive heart disease  . Obesity   . Persistent atrial fibrillation (HCC)    recurrent atrial flutter since 2001 s/p DCCVs, multiple failed AADs, h/o tachy-mediated cardiomyopathy  . Shortness of breath    "can come on at any time" (03/14/2013)  . Sleep apnea    "dx'd; couldn't wear the mask" (03/14/2013)  . Tobacco abuse    Past Surgical History:  Procedure Laterality Date  . ATRIAL FLUTTER ABLATION  2002   atrial flutter; subsequently developed atrial fibrillation  . AV NODE ABLATION  01/24/2013  . CARDIAC CATHETERIZATION  2002  . CARDIAC CATHETERIZATION N/A 10/16/2014   Procedure: Left Heart Cath and Coronary Angiography;  Surgeon: [name], MD;  Location: [place] [facility];  Service: Cardiovascular;  Laterality: N/A;  . CARDIOVERSION  05/31/2011   Procedure: CARDIOVERSION;  Surgeon: Cristopher Estimable. Lattie Haw, MD;  Location: [facility];  Service: Cardiovascular;  Laterality: N/A;  . CARPAL TUNNEL RELEASE Left 1980's  . INSERT / REPLACE / REMOVE PACEMAKER  01/24/2013    Medtronic Adapta L dual-chamber pacemaker, serial number [id number]   . LEFT HEART CATHETERIZATION WITH CORONARY ANGIOGRAM N/A 03/17/2013   Procedure: LEFT HEART CATHETERIZATION WITH CORONARY ANGIOGRAM;  Surgeon: Burnell Blanks, MD; LAD mild dz, D1 branch 100%, inferior branch 99%, CFX OK, RCA 50%, EF 65%    .  LOOP RECORDER IMPLANT  2002  . PERMANENT PACEMAKER INSERTION N/A 01/24/2013   Procedure: PERMANENT PACEMAKER INSERTION;  Surgeon: [name], MD;  Location: [facility] CATH LAB;  Service: Cardiovascular;  Laterality: N/A;  . TIBIAL TUBERCLERPLASTY  ~ 2003   Family History  Problem Relation Age of Onset  . Alzheimer's disease Mother   . Osteoporosis Mother    Social History   Socioeconomic History  . Marital status: Legally Separated    Spouse name: Not on file  . Number of children: 1  . Years of education: Not on file  . Highest education level: Not on file  Occupational History  . Occupation: Merchandiser, retail: UNEMPLOYED  Social Needs  . Financial resource strain: Not on file  . Food insecurity:    Worry: Not on file    Inability: Not on file  . Transportation needs:    Medical: Not on file    Non-medical: Not on file  Tobacco Use  . Smoking status: Former Smoker    Packs/day: 1.00    Years: 42.00    Pack years: 42.00    Types: Cigarettes    Last attempt to quit: 12/31/2011    Years since quitting: 5.7  . Smokeless tobacco: Never Used  Substance and Sexual Activity  . Alcohol use: Not Currently    Comment: 03/14/2013 "stopped drinking back in 2002; never had problem w/it"  . Drug use: No  . Sexual activity: Not Currently  Lifestyle  . Physical activity:    Days per week: Not on file    Minutes per session: Not on file  . Stress: Not on file  Relationships  . Social connections:    Talks on phone: Not on file    Gets together: Not on file    Attends religious service: Not on file    Active member of club or organization: Not on file    Attends meetings of clubs or organizations: Not on file    Relationship status: Not on file  Other Topics Concern  . Not on file  Social History Narrative   Single.   Retired.    1 son, deceased.    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   Enjoys playing on the computer.         Outpatient Encounter Medications as of 09/21/2017  Medication Sig  . amLODipine (NORVASC) 5 MG tablet TAKE 1 TABLET BY MOUTH EVERY DAY  . celecoxib (CELEBREX) 200 MG capsule TAKE 1 CAPSULE BY MOUTH EVERY DAY  . clopidogrel (PLAVIX) 75 MG tablet TAKE 1 TABLET BY MOUTH DAILY  . escitalopram (LEXAPRO) 10 MG tablet TAKE 1 TABLET BY MOUTH EVERY DAY  . fish oil-omega-3 fatty acids 1000 MG capsule Take 1 g by mouth daily.    . fluticasone (FLONASE) 50 MCG/ACT nasal spray Place 1 spray into both nostrils daily as needed for allergies or rhinitis.  . furosemide (LASIX) 20 MG tablet TAKE 1 TABLET BY MOUTH DAILY  .  gabapentin (NEURONTIN) 300 MG capsule TAKE 1 CAPSULE BY MOUTH THREE TIMES A DAY  . glucose blood test strip 1 each by Other route daily. Check Blood Sugar fasting each morning  . isosorbide mononitrate (IMDUR) 30 MG 24 hr tablet TAKE 1 TABLET BY MOUTH EVERY DAY  . JARDIANCE 10 MG TABS tablet TAKE 1 TABLET (10 MG) BY MOUTH EVERY MORNING.  . metFORMIN (GLUCOPHAGE) 500 MG tablet TAKE 1 TABLET BY MOUTH TWICE DAILY WITH A MEAL  . nitroGLYCERIN (NITROSTAT) 0.4 MG SL tablet Place 1 tablet (0.4 mg total) under the tongue every 5 (five) minutes as needed for chest pain.  . pantoprazole (PROTONIX) 40 MG tablet TAKE 1 TABLET BY MOUTH ONCE DAILY  . promethazine (PHENERGAN) 25 MG tablet Take 1 tablet (25 mg total) by mouth every 8 (eight) hours as needed for nausea or vomiting.  . rosuvastatin (CRESTOR) 20 MG tablet TAKE 1 TABLET BY MOUTH BEFORE BEDTIME  . sitaGLIPtin (JANUVIA) 100 MG tablet Take 1 tablet (100 mg total) by mouth daily.  Marland Kitchen warfarin (COUMADIN) 1 MG tablet TAKE 1 TABLET ON MONDAYS. PT. USES 1 MG TABLETS IN ADDITION TO 5 MG TABLETS.  Marland Kitchen warfarin (COUMADIN) 5 MG tablet TAKE 1 TABLET BY MOUTH ON MONDAYS AND 1/2 TABLET ALL OTHER DAYS  . metoprolol succinate (TOPROL-XL) 50 MG 24 hr tablet Take 1 tablet (50 mg total) by mouth daily. Take with or immediately following a meal.   No  facility-administered encounter medications on file as of 09/21/2017.     Activities of Daily Living In your present state of health, do you have any difficulty performing the following activities: 09/21/2017  Hearing? N  Vision? N  Difficulty concentrating or making decisions? Y  Walking or climbing stairs? Y  Dressing or bathing? N  Doing errands, shopping? N  Preparing Food and eating ? N  Using the Toilet? N  In the past six months, have you accidently leaked urine? N  Do you have problems with loss of bowel control? N  Managing your Medications? N  Managing your Finances? N  Housekeeping or managing your Housekeeping? N  Some recent data might be hidden    Patient Care Team: [name], NP as PCP - General (Internal Medicine) Rothbart, Cristopher Estimable, MD (Cardiology)   Assessment:   This is a routine wellness examination for [place].   Hearing Screening   125Hz  250Hz  500Hz  1000Hz  2000Hz  3000Hz  4000Hz  6000Hz  8000Hz   Right ear:   40 40 40  0    Left ear:   40 40 40  40    Vision Screening Comments: May 2019 @ [facility]    Exercise Activities and Dietary recommendations Current Exercise Habits: The patient does not participate in regular exercise at present, Exercise limited by: respiratory conditions(s)  Goals    . Patient Stated     Starting 09/21/2017, I will continue to take medications as prescribed.        Fall Risk Fall Risk  09/21/2017 05/31/2017 01/22/2017 08/25/2015 05/13/2013  Falls in the past year? No No No No No   Depression Screen PHQ 2/9 Scores 09/21/2017 05/31/2017 04/30/2017 04/19/2017  PHQ - 2 Score 0 0 0 0  PHQ- 9 Score 0 - - -    Cognitive Function MMSE - Mini Mental State Exam 09/21/2017  Orientation to time 5  Orientation to Place 5  Registration 3  Attention/ Calculation 0  Recall 1  Recall-comments unable to recall 2 of 3 words  Language- name 2  objects 0  Language- repeat 1  Language- follow 3 step command 3  Language- read & follow direction 0    Write a sentence 0  Copy design 0  Total score 18     PLEASE NOTE: A Mini-Cog screen was completed. Maximum score is 20. A value of 0 denotes this part of [place] MMSE was not completed or the patient failed this part of the Mini-Cog screening.   Mini-Cog Screening Orientation to Time - Max 5 pts Orientation to Place - Max 5 pts Registration - Max 3 pts Recall - Max 3 pts Language Repeat - Max 1 pts Language Follow 3 Step Command - Max 3 pts     Immunization History  Administered Date(s) Administered  . Influenza,inj,Quad PF,6+ Mos 02/05/2014, 01/04/2015, 04/20/2016  . Influenza-Unspecified 01/05/2011, 01/15/2013  . Pneumococcal Polysaccharide-23 01/05/2011  . Tdap 01/05/2011    Screening Tests Health Maintenance  Topic Date Due  . COLONOSCOPY  09/22/2018 (Originally 05/17/2002)  . HIV Screening  09/22/2018 (Originally 05/18/1965)  . INFLUENZA VACCINE  11/15/2017  . HEMOGLOBIN A1C  03/23/2018  . FOOT EXAM  03/28/2018  . OPHTHALMOLOGY EXAM  08/16/2018  . URINE MICROALBUMIN  09/22/2018  . PNA vac Low Risk Adult (2 of 2 - PPSV23) 09/22/2018  . TETANUS/TDAP  01/04/2021  . Hepatitis C Screening  Completed      Plan:     I have personally reviewed, addressed, and noted the following in the patient's chart:  A. Medical and social history B. Use of alcohol, tobacco or illicit drugs  C. Current medications and supplements D. Functional ability and status E.  Nutritional status F.  Physical activity G. Advance directives H. List of other physicians I.  Hospitalizations, surgeries, and ER visits in previous 12 months J.  [place] to include hearing, vision, cognitive, depression L. Referrals and appointments - none  In addition, I have reviewed and discussed with patient certain preventive protocols, quality metrics, and best practice recommendations. A written personalized care plan for preventive services as well as general preventive health recommendations  were provided to patient.  See attached scanned questionnaire for additional information.   Signed,   Lindell Noe, MHA, BS, LPN Health Coach

## 2017-09-21 NOTE — Progress Notes (Signed)
PCP notes:   Health maintenance:  HIV - pt declined Colonoscopy - addressed Microalbumin - completed PCV13 - administered Eye exam - per pt, exam in May 2019   Abnormal screenings:   Hearing - failed  Hearing Screening   125Hz  250Hz  500Hz  1000Hz  2000Hz  3000Hz  4000Hz  6000Hz  8000Hz   Right ear:   40 40 40  0    Left ear:   40 40 40  40     Mini-Cog score: 18/20 MMSE - Mini Mental State Exam 09/21/2017  Orientation to time 5  Orientation to Place 5  Registration 3  Attention/ Calculation 0  Recall 1  Recall-comments unable to recall 2 of 3 words  Language- name 2 objects 0  Language- repeat 1  Language- follow 3 step command 3  Language- read & follow direction 0  Write a sentence 0  Copy design 0  Total score 18    Patient concerns:   None  Nurse concerns:  None  Next PCP appt:   09/27/2017 @ 0930

## 2017-09-21 NOTE — Progress Notes (Signed)
I reviewed health advisor's note, was available for consultation, and agree with documentation and plan.  

## 2017-09-21 NOTE — Patient Instructions (Addendum)
[name] Cook , Thank you for taking time to come for your Medicare Wellness Visit. I appreciate your ongoing commitment to your health goals. Please review the following plan we discussed and let me know if I can assist you in the future.   These are the goals we discussed: Goals    . Patient Stated     Starting 09/21/2017, I will continue to take medications as prescribed.        This is a list of the screening recommended for you and due dates:  Health Maintenance  Topic Date Due  . Colon Cancer Screening  09/22/2018*  . HIV Screening  09/22/2018*  . Flu Shot  11/15/2017  . Hemoglobin A1C  03/23/2018  . Complete foot exam   03/28/2018  . Eye exam for diabetics  08/16/2018  . Urine Protein Check  09/22/2018  . Pneumonia vaccines (2 of 2 - PPSV23) 09/22/2018  . Tetanus Vaccine  01/04/2021  .  Hepatitis C: One time screening is recommended by Center for Disease Control  (CDC) for  adults born from [birth year] through 1963.   Completed  *Topic was postponed. The date shown is not the original due date.    Preventive Care for Adults  A healthy lifestyle and preventive care can promote health and wellness. Preventive health guidelines for adults include the following key practices.  . A routine yearly physical is a good way to check with your health care provider about your health and preventive screening. It is a chance to share any concerns and updates on your health and to receive a thorough exam.  . Visit your dentist for a routine exam and preventive care every 6 months. Brush your teeth twice a day and floss once a day. Good oral hygiene prevents tooth decay and gum disease.  . The frequency of eye exams is based on your age, health, family medical history, use  of contact lenses, and other factors. Follow your health care provider's recommendations for frequency of eye exams.  . Eat a healthy diet. Foods like vegetables, fruits, whole grains, low-fat dairy products, and lean protein  foods contain the nutrients you need without too many calories. Decrease your intake of foods high in solid fats, added sugars, and salt. Eat the right amount of calories for you. Get information about a proper diet from your health care provider, if necessary.  . Regular physical exercise is one of the most important things you can do for your health. Most adults should get at least 150 minutes of moderate-intensity exercise (any activity that increases your heart rate and causes you to sweat) each week. In addition, most adults need muscle-strengthening exercises on 2 or more days a week.  Silver Sneakers may be a benefit available to you. To determine eligibility, you may visit the website: www.silversneakers.com or contact program at 430-321-7079 Mon-Fri between 8AM-8PM.   . Maintain a healthy weight. The body mass index (BMI) is a screening tool to identify possible weight problems. It provides an estimate of body fat based on height and weight. Your health care provider can find your BMI and can help you achieve or maintain a healthy weight.   For adults 20 years and older: ? A BMI below 18.5 is considered underweight. ? A BMI of 18.5 to 24.9 is normal. ? A BMI of 25 to 29.9 is considered overweight. ? A BMI of 30 and above is considered obese.   . Maintain normal blood lipids and cholesterol levels by exercising  and minimizing your intake of saturated fat. Eat a balanced diet with plenty of fruit and vegetables. Blood tests for lipids and cholesterol should begin at age 35 and be repeated every 5 years. If your lipid or cholesterol levels are high, you are over 50, or you are at high risk for heart disease, you may need your cholesterol levels checked more frequently. Ongoing high lipid and cholesterol levels should be treated with medicines if diet and exercise are not working.  . If you smoke, find out from your health care provider how to quit. If you do not use tobacco, please do not  start.  . If you choose to drink alcohol, please do not consume more than 2 drinks per day. One drink is considered to be 12 ounces (355 mL) of beer, 5 ounces (148 mL) of wine, or 1.5 ounces (44 mL) of liquor.  . If you are 29-37 years old, ask your health care provider if you should take aspirin to prevent strokes.  . Use sunscreen. Apply sunscreen liberally and repeatedly throughout the day. You should seek shade when your shadow is shorter than you. Protect yourself by wearing long sleeves, pants, a wide-brimmed hat, and sunglasses year round, whenever you are outdoors.  . Once a month, do a whole body skin exam, using a mirror to look at the skin on your back. Tell your health care provider of new moles, moles that have irregular borders, moles that are larger than a pencil eraser, or moles that have changed in shape or color.

## 2017-09-27 ENCOUNTER — Encounter: Payer: Medicare HMO | Admitting: Primary Care

## 2017-10-01 ENCOUNTER — Ambulatory Visit (INDEPENDENT_AMBULATORY_CARE_PROVIDER_SITE_OTHER): Payer: Medicare Other | Admitting: Primary Care

## 2017-10-01 ENCOUNTER — Other Ambulatory Visit: Payer: Self-pay | Admitting: Physician Assistant

## 2017-10-01 VITALS — BP 106/78 | HR 97 | Temp 98.0°F | Ht 71.0 in | Wt 224.0 lb

## 2017-10-01 DIAGNOSIS — R7989 Other specified abnormal findings of blood chemistry: Secondary | ICD-10-CM

## 2017-10-01 DIAGNOSIS — Z1211 Encounter for screening for malignant neoplasm of colon: Secondary | ICD-10-CM | POA: Diagnosis not present

## 2017-10-01 DIAGNOSIS — G4733 Obstructive sleep apnea (adult) (pediatric): Secondary | ICD-10-CM

## 2017-10-01 DIAGNOSIS — I10 Essential (primary) hypertension: Secondary | ICD-10-CM | POA: Diagnosis not present

## 2017-10-01 DIAGNOSIS — I4819 Other persistent atrial fibrillation: Secondary | ICD-10-CM

## 2017-10-01 DIAGNOSIS — N182 Chronic kidney disease, stage 2 (mild): Secondary | ICD-10-CM

## 2017-10-01 DIAGNOSIS — R945 Abnormal results of liver function studies: Secondary | ICD-10-CM

## 2017-10-01 DIAGNOSIS — R972 Elevated prostate specific antigen [PSA]: Secondary | ICD-10-CM | POA: Diagnosis not present

## 2017-10-01 DIAGNOSIS — I481 Persistent atrial fibrillation: Secondary | ICD-10-CM

## 2017-10-01 DIAGNOSIS — Z0001 Encounter for general adult medical examination with abnormal findings: Secondary | ICD-10-CM | POA: Insufficient documentation

## 2017-10-01 DIAGNOSIS — Z0000 Encounter for general adult medical examination without abnormal findings: Secondary | ICD-10-CM

## 2017-10-01 DIAGNOSIS — E119 Type 2 diabetes mellitus without complications: Secondary | ICD-10-CM

## 2017-10-01 DIAGNOSIS — Z7901 Long term (current) use of anticoagulants: Secondary | ICD-10-CM

## 2017-10-01 MED ORDER — BLOOD GLUCOSE MONITOR KIT: PACK | 0 refills | Status: DC

## 2017-10-01 NOTE — Progress Notes (Signed)
Subjective:    Patient ID: [name] Cook, male    DOB: 12/06/1950, 67 y.o.   MRN: [id number]  HPI  [name] Cook is a 67 year old male who presents today for complete physical.  Immunizations: -Tetanus: Completed in 2012 -Influenza: Completed last season -Pneumonia: Completed Prevnar in 2019, Pneumovax in 2012 -Shingles: Never completed, declines  Diet: He endorses a healthy diet Breakfast: English muffin, cereal  Lunch: Skips Dinner: Meat, vegetable, starch; restaurants 3 days weekly Snacks: Crackers Desserts: Candy, daily  Beverages: Water, flavored water  Exercise: He does not exercise, active Eye exam: Completed in May 2019 Dental exam: No recent exam Colonoscopy: Due. PSA: Elevated Hep C Screen: Negative in 2017  BP Readings from Last 3 Encounters:  10/01/17 106/78  09/21/17 110/84  08/10/17 100/60     Review of Systems  Constitutional: Negative for unexpected weight change.  HENT: Positive for tinnitus. Negative for rhinorrhea.   Respiratory: Negative for cough and shortness of breath.   Cardiovascular: Negative for chest pain.  Gastrointestinal: Negative for constipation and diarrhea.  Genitourinary: Negative for difficulty urinating.  Musculoskeletal: Negative for arthralgias and myalgias.  Skin: Negative for rash.  Allergic/Immunologic: Negative for environmental allergies.  Neurological: Negative for dizziness, numbness and headaches.       Past Medical History:  Diagnosis Date  . Arthritis    "knees and lower back" (03/14/2013)  . Atrial flutter ([place])    radiofrequency ablation in 2001  . CAD (coronary artery disease)    a. Nonobstructive. Cardiac cath in 2001-50% mid RI, normal LM, LAD, RCA b. cath 10/16/2014 95% mid RCA treated with DES, 99% ost D1 medical management due to small aneurysmal segment  . Chronic anticoagulation    chronic Coumadin anticoagulation  . Chronic obstructive pulmonary disease ([place]) 04/20/2011  . Diabetes mellitus, type 2 ([place])    . GERD (gastroesophageal reflux disease)   . Hyperlipidemia   . Hypertension    with hypertensive heart disease  . Obesity   . Persistent atrial fibrillation ([facility])    recurrent atrial flutter since 2001 s/p DCCVs, multiple failed AADs, h/o tachy-mediated cardiomyopathy  . Shortness of breath    "can come on at any time" (03/14/2013)  . Sleep apnea    "dx'd; couldn't wear the mask" (03/14/2013)  . Tobacco abuse      Social History   Socioeconomic History  . Marital status: Legally Separated    Spouse name: Not on file  . Number of children: 1  . Years of education: Not on file  . Highest education level: Not on file  Occupational History  . Occupation: Merchandiser, retail: UNEMPLOYED  Social Needs  . Financial resource strain: Not on file  . Food insecurity:    Worry: Not on file    Inability: Not on file  . Transportation needs:    Medical: Not on file    Non-medical: Not on file  Tobacco Use  . Smoking status: Former Smoker    Packs/day: 1.00    Years: 42.00    Pack years: 42.00    Types: Cigarettes    Last attempt to quit: 12/31/2011    Years since quitting: 5.7  . Smokeless tobacco: Never Used  Substance and Sexual Activity  . Alcohol use: Not Currently    Comment: 03/14/2013 "stopped drinking back in 2002; never had problem w/it"  . Drug use: No  . Sexual activity: Not Currently  Lifestyle  . Physical activity:    Days per  week: Not on file    Minutes per session: Not on file  . Stress: Not on file  Relationships  . Social connections:    Talks on phone: Not on file    Gets together: Not on file    Attends religious service: Not on file    Active member of club or organization: Not on file    Attends meetings of clubs or organizations: Not on file    Relationship status: Not on file  . Intimate partner violence:    Fear of current or ex partner: Not on file    Emotionally abused: Not on file    Physically abused: Not on file    Forced sexual  activity: Not on file  Other Topics Concern  . Not on file  Social History Narrative   Single.   Retired.    1 son, deceased.    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   Enjoys playing on the computer.        Past Surgical History:  Procedure Laterality Date  . ATRIAL FLUTTER ABLATION  2002   atrial flutter; subsequently developed atrial fibrillation  . AV NODE ABLATION  01/24/2013  . CARDIAC CATHETERIZATION  2002  . CARDIAC CATHETERIZATION N/A 10/16/2014   Procedure: Left Heart Cath and Coronary Angiography;  Surgeon: [name], MD;  Location: [facility];  Service: Cardiovascular;  Laterality: N/A;  . CARDIOVERSION  05/31/2011   Procedure: CARDIOVERSION;  Surgeon: Cristopher Estimable. Lattie Haw, MD;  Location: [facility];  Service: Cardiovascular;  Laterality: N/A;  . CARPAL TUNNEL RELEASE Left 1980's  . INSERT / REPLACE / REMOVE PACEMAKER  01/24/2013    Medtronic Adapta L dual-chamber pacemaker, serial number [id number]   . LEFT HEART CATHETERIZATION WITH CORONARY ANGIOGRAM N/A 03/17/2013   Procedure: LEFT HEART CATHETERIZATION WITH CORONARY ANGIOGRAM;  Surgeon: Burnell Blanks, MD; LAD mild dz, D1 branch 100%, inferior branch 99%, CFX OK, RCA 50%, EF 65%    . LOOP RECORDER IMPLANT  2002  . PERMANENT PACEMAKER INSERTION N/A 01/24/2013   Procedure: PERMANENT PACEMAKER INSERTION;  Surgeon: [name], MD;  Location: [facility];  Service: Cardiovascular;  Laterality: N/A;  . TIBIAL TUBERCLERPLASTY  ~ 2003    Family History  Problem Relation Age of Onset  . Alzheimer's disease Mother   . Osteoporosis Mother     No Known Allergies  Current Outpatient Medications on File Prior to Visit  Medication Sig Dispense Refill  . amLODipine (NORVASC) 5 MG tablet TAKE 1 TABLET BY MOUTH EVERY DAY 90 tablet 1  . celecoxib (CELEBREX) 200 MG capsule TAKE 1 CAPSULE BY MOUTH EVERY DAY 90 capsule 1  . clopidogrel (PLAVIX) 75 MG tablet TAKE 1  TABLET BY MOUTH DAILY 90 tablet 3  . escitalopram (LEXAPRO) 10 MG tablet TAKE 1 TABLET BY MOUTH EVERY DAY 90 tablet 1  . fish oil-omega-3 fatty acids 1000 MG capsule Take 1 g by mouth daily.      . fluticasone (FLONASE) 50 MCG/ACT nasal spray Place 1 spray into both nostrils daily as needed for allergies or rhinitis.    . furosemide (LASIX) 20 MG tablet TAKE 1 TABLET BY MOUTH DAILY 90 tablet 2  . gabapentin (NEURONTIN) 300 MG capsule TAKE 1 CAPSULE BY MOUTH THREE TIMES A DAY 270 capsule 0  . glucose blood test strip 1 each by Other route daily. Check Blood Sugar fasting each morning 100 each 3  . isosorbide mononitrate (IMDUR)  30 MG 24 hr tablet TAKE 1 TABLET BY MOUTH EVERY DAY 90 tablet 1  . JARDIANCE 10 MG TABS tablet TAKE 1 TABLET (10 MG) BY MOUTH EVERY MORNING. 90 tablet 1  . metFORMIN (GLUCOPHAGE) 500 MG tablet TAKE 1 TABLET BY MOUTH TWICE DAILY WITH A MEAL 180 tablet 0  . nitroGLYCERIN (NITROSTAT) 0.4 MG SL tablet Place 1 tablet (0.4 mg total) under the tongue every 5 (five) minutes as needed for chest pain. 25 tablet 1  . pantoprazole (PROTONIX) 40 MG tablet TAKE 1 TABLET BY MOUTH ONCE DAILY 90 tablet 3  . promethazine (PHENERGAN) 25 MG tablet Take 1 tablet (25 mg total) by mouth every 8 (eight) hours as needed for nausea or vomiting. 20 tablet 0  . rosuvastatin (CRESTOR) 20 MG tablet TAKE 1 TABLET BY MOUTH BEFORE BEDTIME 90 tablet 1  . sitaGLIPtin (JANUVIA) 100 MG tablet Take 1 tablet (100 mg total) by mouth daily. 90 tablet 3  . warfarin (COUMADIN) 1 MG tablet TAKE 1 TABLET ON MONDAYS. PT. USES 1 MG TABLETS IN ADDITION TO 5 MG TABLETS. 90 tablet 0  . warfarin (COUMADIN) 5 MG tablet TAKE 1 TABLET BY MOUTH ON MONDAYS AND 1/2 TABLET ALL OTHER DAYS 90 tablet 3  . metoprolol succinate (TOPROL-XL) 50 MG 24 hr tablet Take 1 tablet (50 mg total) by mouth daily. Take with or immediately following a meal. 90 tablet 3   No current facility-administered medications on file prior to visit.     BP  106/78   Pulse 97   Temp 98 F (36.7 C) (Oral)   Ht 5\' 11"  (1.803 m)   Wt 224 lb (101.6 kg)   SpO2 97%   BMI 31.24 kg/m    Objective:   Physical Exam  Constitutional: He is oriented to person, place, and time. He appears well-nourished.  HENT:  Mouth/Throat: No oropharyngeal exudate.  Eyes: Pupils are equal, round, and reactive to light. EOM are normal.  Neck: Neck supple. No thyromegaly present.  Cardiovascular: Normal rate and regular rhythm.  Respiratory: Effort normal and breath sounds normal.  GI: Soft. Bowel sounds are normal. There is no tenderness.  Musculoskeletal: Normal range of motion.  Neurological: He is alert and oriented to person, place, and time.  Skin: Skin is warm and dry.  Psychiatric: He has a normal mood and affect.           Assessment & Plan:

## 2017-10-01 NOTE — Assessment & Plan Note (Signed)
Recent CMP unremarkable. Continue to monitor. Managed on statin.

## 2017-10-01 NOTE — Assessment & Plan Note (Signed)
Recent A1C of 6.7, stable. Continue metformin, Januvia, Jardiance.   Foot and eye exam UTD.  Pneumonia vaccinations UTD. Urine microalbumin negative.   Rx for glucometer kit provided.   Follow up in 6 months for re-evaluation.

## 2017-10-01 NOTE — Assessment & Plan Note (Signed)
Following with [facility], continue.

## 2017-10-01 NOTE — Assessment & Plan Note (Signed)
Recent BMP unremarkable. Continue to monitor.

## 2017-10-01 NOTE — Assessment & Plan Note (Signed)
Regular rate and rhythm today. Continue warfarin and Toprol. Pacemaker in place.

## 2017-10-01 NOTE — Patient Instructions (Addendum)
Start exercising. You should be getting 150 minutes of exercise weekly.  Ensure you are consuming 64 ounces of water daily.  Increase vegetables, fruit, whole grains, lean protein.  You will be contacted regarding your referral to the Urologist for your prostate and the GI doctor for your colonoscopy.  Please let us know if you have not been contacted within one week.   Please schedule a follow up appointment in 6 months for diabetes check.   It was a pleasure to see you today!   Preventive Care 65 Years and Older, Male Preventive care refers to lifestyle choices and visits with your health care provider that can promote health and wellness. What does preventive care include?  A yearly physical exam. This is also called an annual well check.  Dental exams once or twice a year.  Routine eye exams. Ask your health care provider how often you should have your eyes checked.  Personal lifestyle choices, including: ? Daily care of your teeth and gums. ? Regular physical activity. ? Eating a healthy diet. ? Avoiding tobacco and drug use. ? Limiting alcohol use. ? Practicing safe sex. ? Taking low doses of aspirin every day. ? Taking vitamin and mineral supplements as recommended by your health care provider. What happens during an annual well check? The services and screenings done by your health care provider during your annual well check will depend on your age, overall health, lifestyle risk factors, and family history of disease. Counseling Your health care provider may ask you questions about your:  Alcohol use.  Tobacco use.  Drug use.  Emotional well-being.  Home and relationship well-being.  Sexual activity.  Eating habits.  History of falls.  Memory and ability to understand (cognition).  Work and work Statistician.  Screening You may have the following tests or measurements:  Height, weight, and BMI.  Blood pressure.  Lipid and cholesterol levels.  These may be checked every 5 years, or more frequently if you are over 35 years old.  Skin check.  Lung cancer screening. You may have this screening every year starting at age 65 if you have a 30-pack-year history of smoking and currently smoke or have quit within the past 15 years.  Fecal occult blood test (FOBT) of the stool. You may have this test every year starting at age 65.  Flexible sigmoidoscopy or colonoscopy. You may have a sigmoidoscopy every 5 years or a colonoscopy every 10 years starting at age 32.  Prostate cancer screening. Recommendations will vary depending on your family history and other risks.  Hepatitis C blood test.  Hepatitis B blood test.  Sexually transmitted disease (STD) testing.  Diabetes screening. This is done by checking your blood sugar (glucose) after you have not eaten for a while (fasting). You may have this done every 1-3 years.  Abdominal aortic aneurysm (AAA) screening. You may need this if you are a current or former smoker.  Osteoporosis. You may be screened starting at age 20 if you are at high risk.  Talk with your health care provider about your test results, treatment options, and if necessary, the need for more tests. Vaccines Your health care provider may recommend certain vaccines, such as:  Influenza vaccine. This is recommended every year.  Tetanus, diphtheria, and acellular pertussis (Tdap, Td) vaccine. You may need a Td booster every 10 years.  Varicella vaccine. You may need this if you have not been vaccinated.  Zoster vaccine. You may need this after age 65.  Measles, mumps, and rubella (MMR) vaccine. You may need at least one dose of MMR if you were born in 1957 or later. You may also need a second dose.  Pneumococcal 13-valent conjugate (PCV13) vaccine. One dose is recommended after age 65.  Pneumococcal polysaccharide (PPSV23) vaccine. One dose is recommended after age 65.  Meningococcal vaccine. You may need this  if you have certain conditions.  Hepatitis A vaccine. You may need this if you have certain conditions or if you travel or work in places where you may be exposed to hepatitis A.  Hepatitis B vaccine. You may need this if you have certain conditions or if you travel or work in places where you may be exposed to hepatitis B.  Haemophilus influenzae type b (Hib) vaccine. You may need this if you have certain risk factors.  Talk to your health care provider about which screenings and vaccines you need and how often you need them. This information is not intended to replace advice given to you by your health care provider. Make sure you discuss any questions you have with your health care provider. Document Released: 04/30/2015 Document Revised: 12/22/2015 Document Reviewed: 02/02/2015 Elsevier Interactive Patient Education  [employer].

## 2017-10-01 NOTE — Assessment & Plan Note (Signed)
Stable in the office today, recent BMP unremarkable.

## 2017-10-01 NOTE — Assessment & Plan Note (Signed)
Immunizations UTD. Declines Shingles vaccination. PSA elevated again, referral placed to Urology. Colonoscopy due, referral placed. Recommended regular exercise and to work on diet. Exam unremarkable.  Labs addressed. Follow up in 1 year for CPE.

## 2017-10-01 NOTE — Assessment & Plan Note (Addendum)
Elevated PSA levels for several years, recent increase to 6.10 from 4. Will send to Urology for further evaluation of BPH vs potential prostate cancer.  Based off of records it appears he was once evaluated and was recommended certain testing, he could not afford the co-pay and never returned.   He now agrees to Urology evaluation.

## 2017-10-01 NOTE — Assessment & Plan Note (Signed)
History of in the past, managed on CPAP machine but stopped using years ago as he "couldn't breathe". Will send to pulmonology for repeat sleep study and further evaluation given daytime drowsiness and waking during the night.

## 2017-10-02 ENCOUNTER — Other Ambulatory Visit: Payer: Self-pay | Admitting: Primary Care

## 2017-10-03 ENCOUNTER — Other Ambulatory Visit: Payer: Self-pay

## 2017-10-03 DIAGNOSIS — Z1211 Encounter for screening for malignant neoplasm of colon: Secondary | ICD-10-CM

## 2017-10-04 ENCOUNTER — Other Ambulatory Visit: Payer: Self-pay

## 2017-10-08 ENCOUNTER — Other Ambulatory Visit: Payer: Self-pay | Admitting: Physician Assistant

## 2017-10-12 ENCOUNTER — Other Ambulatory Visit: Payer: Self-pay | Admitting: Physician Assistant

## 2017-10-17 ENCOUNTER — Telehealth: Payer: Self-pay

## 2017-10-17 NOTE — Telephone Encounter (Signed)
Patient has contacted office to reschedule colonoscopy from Tuesday 07/09 to 11/20/17.  Magda Paganini in Endo has been informed of change.  Thanks [employer]

## 2017-10-23 ENCOUNTER — Other Ambulatory Visit: Payer: Self-pay | Admitting: Primary Care

## 2017-10-25 ENCOUNTER — Ambulatory Visit (INDEPENDENT_AMBULATORY_CARE_PROVIDER_SITE_OTHER)
Admission: RE | Admit: 2017-10-25 | Discharge: 2017-10-25 | Disposition: A | Discharge status: Home / Self Care | Payer: Medicare Other | Source: Ambulatory Visit | Attending: Family Medicine | Admitting: Family Medicine

## 2017-10-25 ENCOUNTER — Encounter: Payer: Self-pay | Admitting: Family Medicine

## 2017-10-25 ENCOUNTER — Ambulatory Visit (INDEPENDENT_AMBULATORY_CARE_PROVIDER_SITE_OTHER): Payer: Medicare Other | Admitting: Family Medicine

## 2017-10-25 ENCOUNTER — Ambulatory Visit: Payer: Medicare Other | Admitting: Family Medicine

## 2017-10-25 ENCOUNTER — Encounter

## 2017-10-25 VITALS — BP 128/78 | HR 95 | Temp 97.6°F | Ht 71.0 in | Wt 226.5 lb

## 2017-10-25 DIAGNOSIS — M25562 Pain in left knee: Secondary | ICD-10-CM | POA: Diagnosis not present

## 2017-10-25 HISTORY — DX: Pain in left knee: M25.562

## 2017-10-25 NOTE — Assessment & Plan Note (Signed)
Medial with susp hx of OA  Some mild swelling/no trauma  Xray today  On celebrex  Already Recommend ice/elevation/relative rest  Suspect he may benefit from an injection Pend result

## 2017-10-25 NOTE — Patient Instructions (Signed)
I think your knee pain may be from osteoarthritis  Use ice or cold compress for 10 minutes whenever you get a chance Avoid high impact activities Xray now   Will make a plan from there

## 2017-10-25 NOTE — Progress Notes (Signed)
Subjective:    Patient ID: [name] Cook, male    DOB: [birth year], 67 y.o.   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 OSA (obstructive sleep apnea) 10/01/2017  . Preventative health care 10/01/2017  . Osteoarthritis 06/26/2017  . Acute coronary syndrome ([place])   . Unstable angina ([place]) 10/14/2014  . Insomnia 10/01/2014  . Depression 03/09/2014  . Elevated PSA 02/09/2014  . Coronary artery disease 11/06/2013  . Long term current use of anticoagulant therapy 06/19/2013  . Pacemaker 04/30/2013  . Acute myocardial infarction, subendocardial infarction, initial episode of care ([place]) 03/15/2013  . Hypokalemia 01/15/2013  . Abnormal LFTs 05/02/2012  . Chronic kidney disease, 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/26/2011  . Chronic obstructive pulmonary disease ([place]) 04/20/2011  . Atrial fibrillation   . Hypertension   . Hyperlipidemia   . Diabetes mellitus type 2, uncomplicated ([place])   . Atrial flutter ([place])   . Tobacco abuse   . Chronic anticoagulation 04/06/2011  . GASTROESOPHAGEAL REFLUX DISEASE 05/16/2010   Past Medical History:  Diagnosis Date  . Arthritis    "knees and lower back" (03/14/2013)  . Atrial flutter ([place])    radiofrequency ablation in 2001  . CAD (coronary artery disease)    a. Nonobstructive. Cardiac cath in 2001-50% mid RI, normal LM, LAD, RCA b. cath 10/16/2014 95% mid RCA treated with DES, 99% ost D1 medical management due to small aneurysmal segment  .  Chronic anticoagulation    chronic Coumadin anticoagulation  . Chronic obstructive pulmonary disease ([place]) 04/20/2011  . Diabetes mellitus, type 2 ([place])   . GERD (gastroesophageal reflux disease)   . Hyperlipidemia   . Hypertension    with hypertensive heart disease  . Obesity   . Persistent atrial fibrillation (HCC)    recurrent atrial flutter since 2001 s/p DCCVs, multiple failed AADs, h/o tachy-mediated cardiomyopathy  . Shortness of breath    "can come on at any time" (03/14/2013)  . Sleep apnea    "dx'd; couldn't wear the mask" (03/14/2013)  . Tobacco abuse    Past Surgical History:  Procedure Laterality Date  . ATRIAL FLUTTER ABLATION  2002   atrial flutter; subsequently developed atrial fibrillation  . AV NODE ABLATION  01/24/2013  . CARDIAC CATHETERIZATION  2002  . CARDIAC CATHETERIZATION N/A 10/16/2014   Procedure: Left Heart Cath and Coronary Angiography;  Surgeon: [name], MD;  Location: [facility];  Service: Cardiovascular;  Laterality: N/A;  . CARDIOVERSION  05/31/2011   Procedure: CARDIOVERSION;  Surgeon: Cristopher Estimable. Lattie Haw, MD;  Location: [facility];  Service: Cardiovascular;  Laterality: N/A;  . CARPAL TUNNEL RELEASE Left 1980's  . INSERT / REPLACE / REMOVE PACEMAKER  01/24/2013    Medtronic Adapta L dual-chamber pacemaker, serial number [id number]   . LEFT HEART CATHETERIZATION  WITH CORONARY ANGIOGRAM N/A 03/17/2013   Procedure: LEFT HEART CATHETERIZATION WITH CORONARY ANGIOGRAM;  Surgeon: Burnell Blanks, MD; LAD mild dz, D1 branch 100%, inferior branch 99%, CFX OK, RCA 50%, EF 65%    . LOOP RECORDER IMPLANT  2002  . PERMANENT PACEMAKER INSERTION N/A 01/24/2013   Procedure: PERMANENT PACEMAKER INSERTION;  Surgeon: [name], MD;  Location: [facility];  Service: Cardiovascular;  Laterality: N/A;  . TIBIAL TUBERCLERPLASTY  ~ 2003   Social History   Tobacco Use  . Smoking status: Former Smoker    Packs/day: 1.00    Years: 42.00    Pack  years: 42.00    Types: Cigarettes    Last attempt to quit: 12/31/2011    Years since quitting: 5.8  . Smokeless tobacco: Never Used  Substance Use Topics  . Alcohol use: Not Currently    Comment: 03/14/2013 "stopped drinking back in 2002; never had problem w/it"  . Drug use: No   Family History  Problem Relation Age of Onset  . Alzheimer's disease Mother   . Osteoporosis Mother    No Known Allergies Current Outpatient Medications on File Prior to Visit  Medication Sig Dispense Refill  . amLODipine (NORVASC) 5 MG tablet TAKE 1 TABLET BY MOUTH EVERY DAY 90 tablet 1  . blood glucose meter kit and supplies KIT Dispense based on patient and insurance preference. Use up to four times daily as directed. (FOR ICD-9 250.00, 250.01). 1 each 0  . celecoxib (CELEBREX) 200 MG capsule TAKE 1 CAPSULE BY MOUTH EVERY DAY 90 capsule 1  . clopidogrel (PLAVIX) 75 MG tablet TAKE 1 TABLET BY MOUTH DAILY 90 tablet 3  . escitalopram (LEXAPRO) 10 MG tablet TAKE 1 TABLET BY MOUTH EVERY DAY 90 tablet 1  . fish oil-omega-3 fatty acids 1000 MG capsule Take 1 g by mouth daily.      . fluticasone (FLONASE) 50 MCG/ACT nasal spray Place 1 spray into both nostrils daily as needed for allergies or rhinitis.    . furosemide (LASIX) 20 MG tablet TAKE 1 TABLET BY MOUTH DAILY 90 tablet 2  . gabapentin (NEURONTIN) 300 MG capsule TAKE 1 CAPSULE BY MOUTH THREE TIMES A DAY 270 capsule 0  . glucose blood (ONE TOUCH ULTRA TEST) test strip USE TO TEST UP TO 4 TIMES DAILY 100 each 2  . isosorbide mononitrate (IMDUR) 30 MG 24 hr tablet TAKE 1 TABLET BY MOUTH EVERY DAY 90 tablet 1  . JARDIANCE 10 MG TABS tablet TAKE 1 TABLET (10 MG) BY MOUTH EVERY MORNING. 90 tablet 1  . lisinopril (PRINIVIL,ZESTRIL) 20 MG tablet TAKE 1 TABLET BY MOUTH EVERY DAY 90 tablet 1  . metFORMIN (GLUCOPHAGE) 500 MG tablet TAKE 1 TABLET BY MOUTH TWICE DAILY WITH A MEAL 180 tablet 1  . nitroGLYCERIN (NITROSTAT) 0.4 MG SL tablet Place 1 tablet (0.4 mg total)  under the tongue every 5 (five) minutes as needed for chest pain. 25 tablet 1  . pantoprazole (PROTONIX) 40 MG tablet TAKE 1 TABLET BY MOUTH ONCE DAILY 90 tablet 3  . promethazine (PHENERGAN) 25 MG tablet Take 1 tablet (25 mg total) by mouth every 8 (eight) hours as needed for nausea or vomiting. 20 tablet 0  . rosuvastatin (CRESTOR) 20 MG tablet TAKE 1 TABLET BY MOUTH BEFORE BEDTIME 90 tablet 1  . sitaGLIPtin (JANUVIA) 100 MG tablet Take 1 tablet (100 mg total) by mouth daily. 90 tablet 3  . warfarin (COUMADIN) 1 MG tablet TAKE 1 TABLET ON  MONDAYS. PT. USES 1 MG TABLETS IN ADDITION TO 5 MG TABLETS. 90 tablet 0  . warfarin (COUMADIN) 5 MG tablet TAKE 1 TABLET BY MOUTH ON MONDAYS AND 1/2 TABLET ALL OTHER DAYS 90 tablet 3  . metoprolol succinate (TOPROL-XL) 50 MG 24 hr tablet Take 1 tablet (50 mg total) by mouth daily. Take with or immediately following a meal. 90 tablet 3   No current facility-administered medications on file prior to visit.     Review of Systems  Constitutional: Negative for activity change, appetite change, fatigue, fever and unexpected weight change.  HENT: Negative for congestion, rhinorrhea, sore throat and trouble swallowing.   Eyes: Negative for pain, redness, itching and visual disturbance.  Respiratory: Negative for cough, chest tightness, shortness of breath and wheezing.   Cardiovascular: Negative for chest pain and palpitations.  Gastrointestinal: Negative for abdominal pain, blood in stool, constipation, diarrhea and nausea.  Endocrine: Negative for cold intolerance, heat intolerance, polydipsia and polyuria.  Genitourinary: Negative for difficulty urinating, dysuria, frequency and urgency.  Musculoskeletal: Positive for arthralgias. Negative for joint swelling and myalgias.       L knee pain   Skin: Negative for pallor and rash.  Neurological: Negative for dizziness, tremors, weakness, numbness and headaches.  Hematological: Negative for adenopathy. Does not  bruise/bleed easily.  Psychiatric/Behavioral: Negative for decreased concentration and dysphoric mood. The patient is not nervous/anxious.        Objective:   Physical Exam  Constitutional: He is oriented to person, place, and time. He appears well-developed and well-nourished. No distress.  overwt and well app   HENT:  Head: Normocephalic and atraumatic.  Eyes: Pupils are equal, round, and reactive to light. Conjunctivae are normal.  Neck: Normal range of motion. Neck supple.  Pulmonary/Chest: Effort normal and breath sounds normal.  Musculoskeletal: He exhibits edema and tenderness. He exhibits no deformity.       Right shoulder: He exhibits decreased range of motion, tenderness, swelling and crepitus. He exhibits normal pulse and normal strength.       Left knee: He exhibits decreased range of motion and swelling. He exhibits no erythema, normal patellar mobility, normal meniscus and no MCL laxity. Tenderness found. Medial joint line and patellar tendon tenderness noted.  L knee Mild superficial swelling  Mild crepitus  Limited flexion  Neg ant drawer and [place]     Lymphadenopathy:    He has no cervical adenopathy.  Neurological: He is alert and oriented to person, place, and time. He displays normal reflexes. Coordination normal.  Skin: Skin is warm and dry. No rash noted. No erythema.  Psychiatric: He has a normal mood and affect.          Assessment & Plan:   Problem List Items Addressed This Visit      Other   Left knee pain - Primary    Medial with susp hx of OA  Some mild swelling/no trauma  Xray today  On celebrex  Already Recommend ice/elevation/relative rest  Suspect he may benefit from an injection Pend result       Relevant Orders   DG Knee 4 Views W/Patella Left (Completed)

## 2017-10-26 ENCOUNTER — Ambulatory Visit (INDEPENDENT_AMBULATORY_CARE_PROVIDER_SITE_OTHER): Payer: Medicare Other | Admitting: Internal Medicine

## 2017-10-26 ENCOUNTER — Encounter: Payer: Self-pay | Admitting: Internal Medicine

## 2017-10-26 ENCOUNTER — Telehealth: Payer: Self-pay

## 2017-10-26 VITALS — BP 106/62 | HR 84 | Ht 71.0 in | Wt 226.0 lb

## 2017-10-26 DIAGNOSIS — G4719 Other hypersomnia: Secondary | ICD-10-CM | POA: Diagnosis not present

## 2017-10-26 MED ORDER — TRAMADOL HCL 50 MG PO TABS: 50.0000 mg | ORAL_TABLET | Freq: Two times a day (BID) | ORAL | 0 refills | Status: DC | PRN

## 2017-10-26 NOTE — Telephone Encounter (Signed)
Please see result note; pt requesting pain med for weekend until sees Dr Lorelei Pont on 10/29/17.

## 2017-10-26 NOTE — Telephone Encounter (Signed)
[facility]   Will cc to pcp so she is aware

## 2017-10-26 NOTE — Progress Notes (Signed)
[facility] Consultation      Assessment and Plan:  Excessive daytime sleepiness. -Symptoms and signs of obstructive sleep apnea, with previous diagnosis of OSA, intolerant to CPAP after few days. - We will send for new sleep study, asked the patient to let us know if he is having trouble with CPAP we can troubleshoot issues.  Diabetes mellitus, GERD, hypertension. - Obstructive sleep apnea can contribute to above problems, therefore treatment of obstructive sleep apnea is important part of their management.  COPD. - Per history, currently patient is not on inhalers, he has no particular complaints of dyspnea at this time.  He is a non-smoker.  Date: 10/26/2017  MRN# [id number] [name] Cook 20-Jul-1950  Referring Physician:  Dr. Carlis Abbott.   [name] Cook is a 67 y.o. old male seen in consultation for chief complaint of:    Chief Complaint  Patient presents with  . Consult    sleep issues: daytime sleepiness; restless sleep    HPI:  The patient is a 67 year old male. He typically goes to bed between 930 and 10 PM.  He falls quickly asleep, usually within 10 minutes.    He notes that he wakes 2-3 times per night and finds th at he is wide awake but he can usually fall back to sleep. However he feels very tired during the day. He was tested and was positive for OSA about 10 years ago and was started on CPAP but felt that he was suffocating. He stopped it after a few days. The machine was taken away.      PMHX:   Past Medical History:  Diagnosis Date  . Arthritis    "knees and lower back" (03/14/2013)  . Atrial flutter ([place])    radiofrequency ablation in 2001  . CAD (coronary artery disease)    a. Nonobstructive. Cardiac cath in 2001-50% mid RI, normal LM, LAD, RCA b. cath 10/16/2014 95% mid RCA treated with DES, 99% ost D1 medical management due to small aneurysmal segment  . Chronic anticoagulation    chronic Coumadin anticoagulation  . Chronic obstructive  pulmonary disease ([place]) 04/20/2011  . Diabetes mellitus, type 2 ([place])   . GERD (gastroesophageal reflux disease)   . Hyperlipidemia   . Hypertension    with hypertensive heart disease  . Obesity   . Persistent atrial fibrillation (HCC)    recurrent atrial flutter since 2001 s/p DCCVs, multiple failed AADs, h/o tachy-mediated cardiomyopathy  . Shortness of breath    "can come on at any time" (03/14/2013)  . Sleep apnea    "dx'd; couldn't wear the mask" (03/14/2013)  . Tobacco abuse    Surgical Hx:  Past Surgical History:  Procedure Laterality Date  . ATRIAL FLUTTER ABLATION  2002   atrial flutter; subsequently developed atrial fibrillation  . AV NODE ABLATION  01/24/2013  . CARDIAC CATHETERIZATION  2002  . CARDIAC CATHETERIZATION N/A 10/16/2014   Procedure: Left Heart Cath and Coronary Angiography;  Surgeon: [name], MD;  Location: [facility];  Service: Cardiovascular;  Laterality: N/A;  . CARDIOVERSION  05/31/2011   Procedure: CARDIOVERSION;  Surgeon: Cristopher Estimable. Lattie Haw, MD;  Location: [facility];  Service: Cardiovascular;  Laterality: N/A;  . CARPAL TUNNEL RELEASE Left 1980's  . INSERT / REPLACE / REMOVE PACEMAKER  01/24/2013    Medtronic Adapta L dual-chamber pacemaker, serial number NWE [id number]   . LEFT HEART CATHETERIZATION WITH CORONARY ANGIOGRAM N/A 03/17/2013   Procedure: LEFT HEART CATHETERIZATION WITH CORONARY ANGIOGRAM;  Surgeon: Burnell Blanks, MD; LAD mild dz, D1 branch 100%, inferior branch 99%, CFX OK, RCA 50%, EF 65%    . LOOP RECORDER IMPLANT  2002  . PERMANENT PACEMAKER INSERTION N/A 01/24/2013   Procedure: PERMANENT PACEMAKER INSERTION;  Surgeon: [name], MD;  Location: [facility];  Service: Cardiovascular;  Laterality: N/A;  . TIBIAL TUBERCLERPLASTY  ~ 2003   Family Hx:  Family History  Problem Relation Age of Onset  . Alzheimer's disease Mother   . Osteoporosis Mother    Social Hx:   Social History   Tobacco Use  . Smoking status:  Former Smoker    Packs/day: 1.00    Years: 42.00    Pack years: 42.00    Types: Cigarettes    Last attempt to quit: 12/31/2011    Years since quitting: 5.8  . Smokeless tobacco: Never Used  Substance Use Topics  . Alcohol use: Not Currently    Comment: 03/14/2013 "stopped drinking back in 2002; never had problem w/it"  . Drug use: No   Medication:    Current Outpatient Medications:  .  amLODipine (NORVASC) 5 MG tablet, TAKE 1 TABLET BY MOUTH EVERY DAY, Disp: 90 tablet, Rfl: 1 .  blood glucose meter kit and supplies KIT, Dispense based on patient and insurance preference. Use up to four times daily as directed. (FOR ICD-9 250.00, 250.01)., Disp: 1 each, Rfl: 0 .  celecoxib (CELEBREX) 200 MG capsule, TAKE 1 CAPSULE BY MOUTH EVERY DAY, Disp: 90 capsule, Rfl: 1 .  clopidogrel (PLAVIX) 75 MG tablet, TAKE 1 TABLET BY MOUTH DAILY, Disp: 90 tablet, Rfl: 3 .  escitalopram (LEXAPRO) 10 MG tablet, TAKE 1 TABLET BY MOUTH EVERY DAY, Disp: 90 tablet, Rfl: 1 .  fish oil-omega-3 fatty acids 1000 MG capsule, Take 1 g by mouth daily.  , Disp: , Rfl:  .  fluticasone (FLONASE) 50 MCG/ACT nasal spray, Place 1 spray into both nostrils daily as needed for allergies or rhinitis., Disp: , Rfl:  .  furosemide (LASIX) 20 MG tablet, TAKE 1 TABLET BY MOUTH DAILY, Disp: 90 tablet, Rfl: 2 .  gabapentin (NEURONTIN) 300 MG capsule, TAKE 1 CAPSULE BY MOUTH THREE TIMES A DAY, Disp: 270 capsule, Rfl: 0 .  glucose blood (ONE TOUCH ULTRA TEST) test strip, USE TO TEST UP TO 4 TIMES DAILY, Disp: 100 each, Rfl: 2 .  isosorbide mononitrate (IMDUR) 30 MG 24 hr tablet, TAKE 1 TABLET BY MOUTH EVERY DAY, Disp: 90 tablet, Rfl: 1 .  JARDIANCE 10 MG TABS tablet, TAKE 1 TABLET (10 MG) BY MOUTH EVERY MORNING., Disp: 90 tablet, Rfl: 1 .  lisinopril (PRINIVIL,ZESTRIL) 20 MG tablet, TAKE 1 TABLET BY MOUTH EVERY DAY, Disp: 90 tablet, Rfl: 1 .  metFORMIN (GLUCOPHAGE) 500 MG tablet, TAKE 1 TABLET BY MOUTH TWICE DAILY WITH A MEAL, Disp: 180  tablet, Rfl: 1 .  metoprolol succinate (TOPROL-XL) 50 MG 24 hr tablet, Take 1 tablet (50 mg total) by mouth daily. Take with or immediately following a meal., Disp: 90 tablet, Rfl: 3 .  nitroGLYCERIN (NITROSTAT) 0.4 MG SL tablet, Place 1 tablet (0.4 mg total) under the tongue every 5 (five) minutes as needed for chest pain., Disp: 25 tablet, Rfl: 1 .  pantoprazole (PROTONIX) 40 MG tablet, TAKE 1 TABLET BY MOUTH ONCE DAILY, Disp: 90 tablet, Rfl: 3 .  promethazine (PHENERGAN) 25 MG tablet, Take 1 tablet (25 mg total) by mouth every 8 (eight) hours as needed for nausea or vomiting., Disp: 20 tablet,  Rfl: 0 .  rosuvastatin (CRESTOR) 20 MG tablet, TAKE 1 TABLET BY MOUTH BEFORE BEDTIME, Disp: 90 tablet, Rfl: 1 .  sitaGLIPtin (JANUVIA) 100 MG tablet, Take 1 tablet (100 mg total) by mouth daily., Disp: 90 tablet, Rfl: 3 .  warfarin (COUMADIN) 1 MG tablet, TAKE 1 TABLET ON MONDAYS. PT. USES 1 MG TABLETS IN ADDITION TO 5 MG TABLETS., Disp: 90 tablet, Rfl: 0 .  warfarin (COUMADIN) 5 MG tablet, TAKE 1 TABLET BY MOUTH ON MONDAYS AND 1/2 TABLET ALL OTHER DAYS, Disp: 90 tablet, Rfl: 3   Allergies:  Patient has no known allergies.  Review of Systems: Gen:  Denies  fever, sweats, chills HEENT: Denies blurred vision, double vision. bleeds, sore throat Cvc:  No dizziness, chest pain. Resp:   Denies cough or sputum production, shortness of breath Gi: Denies swallowing difficulty, stomach pain. Gu:  Denies bladder incontinence, burning urine Ext:   No Joint pain, stiffness. Skin: No skin rash,  hives  Endoc:  No polyuria, polydipsia. Psych: No depression, insomnia. Other:  All other systems were reviewed with the patient and were negative other that what is mentioned in the HPI.   Physical Examination:   VS: BP 106/62 (BP Location: Left Arm, Cuff Size: Normal)   Pulse 84   Ht '5\' 11"'  (1.803 m)   Wt 226 lb (102.5 kg)   SpO2 93%   BMI 31.52 kg/m   General Appearance: No distress  Neuro:without focal  findings,  speech normal,  HEENT: PERRLA, EOM intact.   Pulmonary: normal breath sounds, No wheezing.  CardiovascularNormal S1,S2.  No m/r/g.   Abdomen: Benign, Soft, non-tender. Renal:  No costovertebral tenderness  GU:  No performed at this time. Endoc: No evident thyromegaly, no signs of acromegaly. Skin:   warm, no rashes, no ecchymosis  Extremities: normal, no cyanosis, clubbing.  Other findings:    LABORATORY PANEL:   CBC No results for input(s): WBC, HGB, HCT, PLT in the last 168 hours. ------------------------------------------------------------------------------------------------------------------  Chemistries  No results for input(s): NA, K, CL, CO2, GLUCOSE, BUN, CREATININE, CALCIUM, MG, AST, ALT, ALKPHOS, BILITOT in the last 168 hours.  Invalid input(s): GFRCGP ------------------------------------------------------------------------------------------------------------------  Cardiac Enzymes No results for input(s): TROPONINI in the last 168 hours. ------------------------------------------------------------  RADIOLOGY:  Dg Knee 4 Views W/patella Left  Result Date: 10/25/2017 CLINICAL DATA:  Knee pain EXAM: LEFT KNEE - COMPLETE 4+ VIEW COMPARISON:  CT left knee 12/27/2009 FINDINGS: No evidence of fracture, dislocation, or joint effusion. No evidence of arthropathy or other focal bone abnormality. Soft tissues are unremarkable. Mild arterial calcification. IMPRESSION: Negative. Electronically Signed   By: Franchot Gallo M.D.   On: 10/25/2017 15:29       Thank  you for the consultation and for allowing [facility] to assist in the care of your patient. Our recommendations are noted above.  Please contact us if we can be of further service.   Marda Stalker, M.D., F.C.C.P.  Board Certified in Internal Medicine, Pulmonary Medicine, [place], and Sleep Medicine.  [facility] Office Number:  [phone]   10/26/2017

## 2017-10-26 NOTE — Telephone Encounter (Signed)
Pt notified Rx sent 

## 2017-10-26 NOTE — Patient Instructions (Addendum)
Will send your for a sleep study   Sleep Apnea    Sleep apnea is disorder that affects a person's sleep. A person with sleep apnea has abnormal pauses in their breathing when they sleep. It is hard for them to get a good sleep. This makes a person tired during the day. It also can lead to other physical problems. There are three types of sleep apnea. One type is when breathing stops for a short time because your airway is blocked (obstructive sleep apnea). Another type is when the brain sometimes fails to give the normal signal to breathe to the muscles that control your breathing (central sleep apnea). The third type is a combination of the other two types.  HOME CARE   Take all medicine as told by your doctor.  Avoid alcohol, calming medicines (sedatives), and depressant drugs.  Try to lose weight if you are overweight. Talk to your doctor about a healthy weight goal.  Your doctor may have you use a device that helps to open your airway. It can help you get the air that you need. It is called a positive airway pressure (PAP) device.   MAKE SURE YOU:   Understand these instructions.  Will watch your condition.  Will get help right away if you are not doing well or get worse.  It may take approximately 1 month for you to get used to wearing her CPAP every night.  Be sure to work with your machine to get used to it, be patient, it may take time!  If you have trouble tolerating CPAP DO NOT RETURN YOUR MACHINE; Contact our office to see if we can help you tolerate the CPAP better first!

## 2017-10-26 NOTE — Telephone Encounter (Signed)
Copied from [place] [phone]. Topic: Quick Communication - Other Results >> Oct 26, 2017  3:27 PM Synthia Innocent wrote: Requesting knee xray results

## 2017-10-29 ENCOUNTER — Ambulatory Visit (INDEPENDENT_AMBULATORY_CARE_PROVIDER_SITE_OTHER): Payer: Medicare Other | Admitting: Family Medicine

## 2017-10-29 ENCOUNTER — Other Ambulatory Visit: Payer: Self-pay | Admitting: Physician Assistant

## 2017-10-29 ENCOUNTER — Encounter: Payer: Self-pay | Admitting: Family Medicine

## 2017-10-29 VITALS — BP 100/60 | HR 88 | Temp 97.8°F | Ht 71.0 in | Wt 228.2 lb

## 2017-10-29 DIAGNOSIS — M25562 Pain in left knee: Secondary | ICD-10-CM

## 2017-10-29 DIAGNOSIS — Z7901 Long term (current) use of anticoagulants: Secondary | ICD-10-CM

## 2017-10-29 DIAGNOSIS — I481 Persistent atrial fibrillation: Secondary | ICD-10-CM

## 2017-10-29 DIAGNOSIS — M2392 Unspecified internal derangement of left knee: Secondary | ICD-10-CM | POA: Diagnosis not present

## 2017-10-29 DIAGNOSIS — I4819 Other persistent atrial fibrillation: Secondary | ICD-10-CM

## 2017-10-29 MED ORDER — METHYLPREDNISOLONE ACETATE 40 MG/ML IJ SUSP
80.0000 mg | Freq: Once | INTRAMUSCULAR | Status: AC
  Administered 2017-10-29: 80 mg via INTRA_ARTICULAR

## 2017-10-29 NOTE — Progress Notes (Signed)
Dr. Frederico Hamman T. Shalla Bulluck, MD, [facility] [place], [address] Phone: [phone] Fax: [phone]  10/29/2017  Patient: [name] Cook, MRN: [id number], DOB: 25-Aug-1950, 67 y.o.  Primary Physician:  [name], NP   Chief Complaint  Patient presents with  . Knee Pain    left for two weeks   Subjective:   [name] Cook is a 67 y.o. very pleasant male patient who presents with the following:  Left knee pain evaluation:  Anteromedial knee pain. No activity.  He does not recall any specific incident, but right now he is fairly limited in his ability to get around to do things.  His pain is primarily in the anteromedial aspect of his knee.  Having some pain with rotation and with flexion.  Prior to this he was fairly functional and did not have any significant limitation regarding his knees.  No prior injuries and no prior surgery.  On disability from his heart.  Able to do yardwork at baseline.  He is on Coumadin.   Men  L knee inj  Past Medical History, Surgical History, Social History, Family History, Problem List, Medications, and Allergies have been reviewed and updated if relevant.  Patient Active Problem List   Diagnosis Date Noted  . Left knee pain 10/25/2017  . OSA (obstructive sleep apnea) 10/01/2017  . Preventative health care 10/01/2017  . Osteoarthritis 06/26/2017  . Acute coronary syndrome ([place])   . Unstable angina ([place]) 10/14/2014  . Insomnia 10/01/2014  . Depression 03/09/2014  . Elevated PSA 02/09/2014  . Coronary artery disease 11/06/2013  . Long term current use of anticoagulant therapy 06/19/2013  . Pacemaker 04/30/2013  . Acute myocardial infarction, subendocardial infarction, initial episode of care ([place]) 03/15/2013  . Hypokalemia 01/15/2013  . Abnormal LFTs 05/02/2012  . Chronic kidney disease, 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/26/2011  . Chronic obstructive pulmonary disease ([place]) 04/20/2011  .  Atrial fibrillation   . Hypertension   . Hyperlipidemia   . Diabetes mellitus type 2, uncomplicated ([place])   . Atrial flutter ([place])   . Tobacco abuse   . Chronic anticoagulation 04/06/2011  . GASTROESOPHAGEAL REFLUX DISEASE 05/16/2010    Past Medical History:  Diagnosis Date  . Arthritis    "knees and lower back" (03/14/2013)  . Atrial flutter ([place])    radiofrequency ablation in 2001  . CAD (coronary artery disease)    a. Nonobstructive. Cardiac cath in 2001-50% mid RI, normal LM, LAD, RCA b. cath 10/16/2014 95% mid RCA treated with DES, 99% ost D1 medical management due to small aneurysmal segment  . Chronic anticoagulation    chronic Coumadin anticoagulation  . Chronic obstructive pulmonary disease ([place]) 04/20/2011  . Diabetes mellitus, type 2 ([place])   . GERD (gastroesophageal reflux disease)   . Hyperlipidemia   . Hypertension    with hypertensive heart disease  . Obesity   . Persistent atrial fibrillation ([facility])    recurrent atrial flutter since 2001 s/p DCCVs, multiple failed AADs, h/o tachy-mediated cardiomyopathy  . Shortness of breath    "can come on at any time" (03/14/2013)  . Sleep apnea    "dx'd; couldn't wear the mask" (03/14/2013)  . Tobacco abuse     Past Surgical History:  Procedure Laterality Date  . ATRIAL FLUTTER ABLATION  2002   atrial flutter; subsequently developed atrial fibrillation  . AV NODE ABLATION  01/24/2013  . CARDIAC CATHETERIZATION  2002  . CARDIAC CATHETERIZATION N/A  10/16/2014   Procedure: Left Heart Cath and Coronary Angiography;  Surgeon: [name], MD;  Location: [facility];  Service: Cardiovascular;  Laterality: N/A;  . CARDIOVERSION  05/31/2011   Procedure: CARDIOVERSION;  Surgeon: Cristopher Estimable. Lattie Haw, MD;  Location: [facility];  Service: Cardiovascular;  Laterality: N/A;  . CARPAL TUNNEL RELEASE Left 1980's  . INSERT / REPLACE / REMOVE PACEMAKER  01/24/2013    Medtronic Adapta L dual-chamber pacemaker, serial number [id number]     . LEFT HEART CATHETERIZATION WITH CORONARY ANGIOGRAM N/A 03/17/2013   Procedure: LEFT HEART CATHETERIZATION WITH CORONARY ANGIOGRAM;  Surgeon: Burnell Blanks, MD; LAD mild dz, D1 branch 100%, inferior branch 99%, CFX OK, RCA 50%, EF 65%    . LOOP RECORDER IMPLANT  2002  . PERMANENT PACEMAKER INSERTION N/A 01/24/2013   Procedure: PERMANENT PACEMAKER INSERTION;  Surgeon: [name], MD;  Location: [facility];  Service: Cardiovascular;  Laterality: N/A;  . TIBIAL TUBERCLERPLASTY  ~ 2003    Social History   Socioeconomic History  . Marital status: Legally Separated    Spouse name: Not on file  . Number of children: 1  . Years of education: Not on file  . Highest education level: Not on file  Occupational History  . Occupation: Merchandiser, retail: UNEMPLOYED  Social Needs  . Financial resource strain: Not on file  . Food insecurity:    Worry: Not on file    Inability: Not on file  . Transportation needs:    Medical: Not on file    Non-medical: Not on file  Tobacco Use  . Smoking status: Former Smoker    Packs/day: 1.00    Years: 42.00    Pack years: 42.00    Types: Cigarettes    Last attempt to quit: 12/31/2011    Years since quitting: 5.8  . Smokeless tobacco: Never Used  Substance and Sexual Activity  . Alcohol use: Not Currently    Comment: 03/14/2013 "stopped drinking back in 2002; never had problem w/it"  . Drug use: No  . Sexual activity: Not Currently  Lifestyle  . Physical activity:    Days per week: Not on file    Minutes per session: Not on file  . Stress: Not on file  Relationships  . Social connections:    Talks on phone: Not on file    Gets together: Not on file    Attends religious service: Not on file    Active member of club or organization: Not on file    Attends meetings of clubs or organizations: Not on file    Relationship status: Not on file  . Intimate partner violence:    Fear of current or ex partner: Not on file     Emotionally abused: Not on file    Physically abused: Not on file    Forced sexual activity: Not on file  Other Topics Concern  . Not on file  Social History Narrative   Single.   Retired.    1 son, deceased.    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   Enjoys playing on the computer.        Family History  Problem Relation Age of Onset  . Alzheimer's disease Mother   . Osteoporosis Mother     No Known Allergies  Medication list reviewed and updated in full in [place].  GEN: No fevers, chills. Nontoxic. Primarily MSK c/o today. MSK: Detailed  in the HPI GI: tolerating PO intake without difficulty Neuro: No numbness, parasthesias, or tingling associated. Otherwise the pertinent positives of the ROS are noted above.   Objective:   BP 100/60   Pulse 88   Temp 97.8 F (36.6 C) (Oral)   Ht _0  (1.803 m)   Wt 228 lb 4 oz (103.5 kg)   BMI 31.83 kg/m    GEN: WDWN, NAD, Non-toxic, Alert & Oriented x 3 HEENT: Atraumatic, Normocephalic.  Ears and Nose: No external deformity. EXTR: No clubbing/cyanosis/edema NEURO: Normal gait.  PSYCH: Normally interactive. Conversant. Not depressed or anxious appearing.  Calm demeanor.   Knee:  L Gait: Normal heel toe pattern ROM: 0-110 Effusion: neg Echymosis or edema: none Patellar tendon NT Painful PLICA: neg Patellar grind: negative Medial and lateral patellar facet loading: negative medial and lateral joint lines: medial joint line is tender Mcmurray's + for pain Flexion-pinch + Varus and valgus stress: stable Lachman: neg Ant and Post drawer: neg Hip abduction, IR, ER: WNL Hip flexion str: 5/5 Hip abd: 5/5 Quad: 5/5 VMO atrophy:No Hamstring concentric and eccentric: 5/5   Radiology: Dg Knee 4 Views W/patella Left  Result Date: 10/25/2017 CLINICAL DATA:  Knee pain EXAM: LEFT KNEE - COMPLETE 4+ VIEW COMPARISON:  CT left knee 12/27/2009 FINDINGS: No evidence of  fracture, dislocation, or joint effusion. No evidence of arthropathy or other focal bone abnormality. Soft tissues are unremarkable. Mild arterial calcification. IMPRESSION: Negative. Electronically Signed   By: Franchot Gallo M.D.   On: 10/25/2017 15:29    Assessment and Plan:   Internal derangement of left knee  Acute pain of left knee - Plan: methylPREDNISolone acetate (DEPO-MEDROL) injection 80 mg  Atrial fibrillation  Long term (current) use of anticoagulants  Suspect degenerative medial meniscal tear. Plain films look good.   On Coumadin.   Will inject knee with steroids and initiate HEP with f/u. A rehabilitation program from the [facility] [facility] was reviewed with the patient face to face for their condition.   Knee Injection, L Patient verbally consented to procedure. Risks (including potential rare risk of infection), benefits, and alternatives explained. Sterilely prepped with Chloraprep. Ethyl cholride used for anesthesia. 8 cc Lidocaine 1% mixed with 2 mL Depo-Medrol 40 mg injected using the anteromedial approach without difficulty. No complications with procedure and tolerated well. Patient had decreased pain post-injection.   Follow-up: Return in about 6 weeks (around 12/10/2017).  Meds ordered this encounter  Medications  . methylPREDNISolone acetate (DEPO-MEDROL) injection 80 mg   Signed,  Yeray Tomas T. Lashina Milles, MD   Allergies as of 10/29/2017   No Known Allergies     Medication List        Accurate as of 10/29/17 11:59 PM. Always use your most recent med list.          amLODipine 5 MG tablet Commonly known as:  NORVASC TAKE 1 TABLET BY MOUTH EVERY DAY   blood glucose meter kit and supplies Kit Dispense based on patient and insurance preference. Use up to four times daily as directed. (FOR ICD-9 250.00, 250.01).   celecoxib 200 MG capsule Commonly known as:  CELEBREX TAKE 1 CAPSULE BY MOUTH EVERY DAY   clopidogrel 75 MG  tablet Commonly known as:  PLAVIX TAKE 1 TABLET BY MOUTH DAILY   escitalopram 10 MG tablet Commonly known as:  LEXAPRO TAKE 1 TABLET BY MOUTH EVERY DAY   fish oil-omega-3 fatty acids 1000 MG capsule Take 1 g by mouth daily.  fluticasone 50 MCG/ACT nasal spray Commonly known as:  FLONASE Place 1 spray into both nostrils daily as needed for allergies or rhinitis.   furosemide 20 MG tablet Commonly known as:  LASIX TAKE 1 TABLET BY MOUTH DAILY   gabapentin 300 MG capsule Commonly known as:  NEURONTIN TAKE 1 CAPSULE BY MOUTH THREE TIMES A DAY   glucose blood test strip Commonly known as:  ONE TOUCH ULTRA TEST USE TO TEST UP TO 4 TIMES DAILY   isosorbide mononitrate 30 MG 24 hr tablet Commonly known as:  IMDUR TAKE 1 TABLET BY MOUTH EVERY DAY   JARDIANCE 10 MG Tabs tablet Generic drug:  empagliflozin TAKE 1 TABLET (10 MG) BY MOUTH EVERY MORNING.   lisinopril 20 MG tablet Commonly known as:  PRINIVIL,ZESTRIL TAKE 1 TABLET BY MOUTH EVERY DAY   metFORMIN 500 MG tablet Commonly known as:  GLUCOPHAGE TAKE 1 TABLET BY MOUTH TWICE DAILY WITH A MEAL   metoprolol succinate 50 MG 24 hr tablet Commonly known as:  TOPROL-XL Take 1 tablet (50 mg total) by mouth daily. Take with or immediately following a meal.   nitroGLYCERIN 0.4 MG SL tablet Commonly known as:  NITROSTAT Place 1 tablet (0.4 mg total) under the tongue every 5 (five) minutes as needed for chest pain.   pantoprazole 40 MG tablet Commonly known as:  PROTONIX TAKE 1 TABLET BY MOUTH ONCE DAILY   promethazine 25 MG tablet Commonly known as:  PHENERGAN Take 1 tablet (25 mg total) by mouth every 8 (eight) hours as needed for nausea or vomiting.   rosuvastatin 20 MG tablet Commonly known as:  CRESTOR TAKE 1 TABLET BY MOUTH BEFORE BEDTIME   sitaGLIPtin 100 MG tablet Commonly known as:  JANUVIA Take 1 tablet (100 mg total) by mouth daily.   traMADol 50 MG tablet Commonly known as:  ULTRAM Take 1 tablet (50 mg  total) by mouth every 12 (twelve) hours as needed for severe pain. Caution of sedation   warfarin 5 MG tablet Commonly known as:  COUMADIN Take as directed by the anticoagulation clinic. If you are unsure how to take this medication, talk to your nurse or doctor. Original instructions:  TAKE 1 TABLET BY MOUTH ON MONDAYS AND 1/2 TABLET ALL OTHER DAYS   warfarin 1 MG tablet Commonly known as:  COUMADIN Take as directed by the anticoagulation clinic. If you are unsure how to take this medication, talk to your nurse or doctor. Original instructions:  TAKE 1 TABLET ON MONDAYS. PT. USES 1 MG TABLETS IN ADDITION TO 5 MG TABLETS.

## 2017-10-30 ENCOUNTER — Encounter: Payer: Self-pay | Admitting: Family Medicine

## 2017-11-01 ENCOUNTER — Ambulatory Visit (INDEPENDENT_AMBULATORY_CARE_PROVIDER_SITE_OTHER): Payer: Medicare Other | Admitting: General Practice

## 2017-11-01 ENCOUNTER — Other Ambulatory Visit: Payer: Self-pay | Admitting: Primary Care

## 2017-11-01 DIAGNOSIS — I481 Persistent atrial fibrillation: Secondary | ICD-10-CM

## 2017-11-01 DIAGNOSIS — I4819 Other persistent atrial fibrillation: Secondary | ICD-10-CM

## 2017-11-01 LAB — POCT INR: INR: 2.6 (ref 2.0–3.0)

## 2017-11-01 MED ORDER — TRAMADOL HCL 50 MG PO TABS: 50.0000 mg | ORAL_TABLET | Freq: Four times a day (QID) | ORAL | 0 refills | Status: DC | PRN

## 2017-11-01 NOTE — Telephone Encounter (Signed)
Pt received an injection at 7/15 OV and is still having pain. He is requesting pain medicine for ongoing pain. [facility] [facility]

## 2017-11-01 NOTE — Telephone Encounter (Signed)
I would give this more time, but small amount of pain med is not unreasonable.   Tramadol 50 mg, 1 po q 6 hours prn pain, #20, 0 refills

## 2017-11-01 NOTE — Patient Instructions (Addendum)
Pre visit review using our clinic review tool, if applicable. No additional management support is needed unless otherwise documented below in the visit note.  Continue to take 2.5 mg all days except 3.5 mg on Mondays.  Re-check in 6 weeks.

## 2017-11-01 NOTE — Telephone Encounter (Signed)
Patient notified as instructed by telephone and verbalized understanding.  Refill order placed, Please sign and send.

## 2017-11-02 ENCOUNTER — Encounter (INDEPENDENT_AMBULATORY_CARE_PROVIDER_SITE_OTHER): Payer: Self-pay

## 2017-11-07 ENCOUNTER — Ambulatory Visit (INDEPENDENT_AMBULATORY_CARE_PROVIDER_SITE_OTHER): Payer: Medicare Other | Admitting: *Deleted

## 2017-11-07 DIAGNOSIS — I495 Sick sinus syndrome: Secondary | ICD-10-CM

## 2017-11-08 ENCOUNTER — Encounter: Payer: Self-pay | Admitting: Cardiology

## 2017-11-08 NOTE — Progress Notes (Signed)
Remote pacemaker transmission.   

## 2017-11-09 ENCOUNTER — Telehealth: Payer: Self-pay | Admitting: Primary Care

## 2017-11-09 NOTE — Telephone Encounter (Signed)
Spoke to pt and advised OV required. Advised there are no available openings for PCP. Pt states he would take first avail appt; scheduled with [facility] 7/29

## 2017-11-09 NOTE — Telephone Encounter (Signed)
Copied from [place] [id number]. Topic: Quick Communication - See Telephone Encounter >> Nov 09, 2017  8:49 AM Mylinda Latina, NT wrote: CRM for notification. See Telephone encounter for: 11/09/17. Patient called and states that he is experiencing head congestion and cough. Office has no opening for today, pt refuse to go to another office. He is wondering if Anda Kraft can call him in something for it  CB# [phone]   [id number] [place], [place] [phone] (Phone) [phone] (Fax)

## 2017-11-11 ENCOUNTER — Other Ambulatory Visit: Payer: Self-pay | Admitting: Physician Assistant

## 2017-11-12 ENCOUNTER — Encounter: Payer: Self-pay | Admitting: Internal Medicine

## 2017-11-12 ENCOUNTER — Ambulatory Visit (INDEPENDENT_AMBULATORY_CARE_PROVIDER_SITE_OTHER): Payer: Medicare Other | Admitting: Internal Medicine

## 2017-11-12 VITALS — BP 106/70 | HR 97 | Temp 97.7°F | Wt 226.0 lb

## 2017-11-12 DIAGNOSIS — R0982 Postnasal drip: Secondary | ICD-10-CM

## 2017-11-12 DIAGNOSIS — R05 Cough: Secondary | ICD-10-CM

## 2017-11-12 DIAGNOSIS — R059 Cough, unspecified: Secondary | ICD-10-CM

## 2017-11-12 NOTE — Progress Notes (Signed)
HPI  Pt presents to the clinic today with c/o cough. He reports this started 1 week ago. The cough is productive of yellow mucous. He denies runny nose, nasal congestion, ear pain, sore throat. He denies chest pain or shortness of breath. He denies fever, chills or body aches. He has tried [facility] and Tussin as needed with minimal relief. He has no history of allergies. He has COPD and DM 2. He has not had sick contacts that he is aware of.  Review of Systems      Past Medical History:  Diagnosis Date  . Arthritis    "knees and lower back" (03/14/2013)  . Atrial flutter ([place])    radiofrequency ablation in 2001  . CAD (coronary artery disease)    a. Nonobstructive. Cardiac cath in 2001-50% mid RI, normal LM, LAD, RCA b. cath 10/16/2014 95% mid RCA treated with DES, 99% ost D1 medical management due to small aneurysmal segment  . Chronic anticoagulation    chronic Coumadin anticoagulation  . Chronic obstructive pulmonary disease ([place]) 04/20/2011  . Diabetes mellitus, type 2 ([place])   . GERD (gastroesophageal reflux disease)   . Hyperlipidemia   . Hypertension    with hypertensive heart disease  . Obesity   . Persistent atrial fibrillation (H[facility])    recurrent atrial flutter since 2001 s/p DCCVs, multiple failed AADs, h/o tachy-mediated cardiomyopathy  . Shortness of breath    "can come on at any time" (03/14/2013)  . Sleep apnea    "dx'd; couldn't wear the mask" (03/14/2013)  . Tobacco abuse     Family History  Problem Relation Age of Onset  . Alzheimer's disease Mother   . Osteoporosis Mother     Social History   Socioeconomic History  . Marital status: Legally Separated    Spouse name: Not on file  . Number of children: 1  . Years of education: Not on file  . Highest education level: Not on file  Occupational History  . Occupation: Merchandiser, retail: UNEMPLOYED  Social Needs  . Financial resource strain: Not on file  . Food insecurity:    Worry: Not on file   Inability: Not on file  . Transportation needs:    Medical: Not on file    Non-medical: Not on file  Tobacco Use  . Smoking status: Former Smoker    Packs/day: 1.00    Years: 42.00    Pack years: 42.00    Types: Cigarettes    Last attempt to quit: 12/31/2011    Years since quitting: 5.8  . Smokeless tobacco: Never Used  Substance and Sexual Activity  . Alcohol use: Not Currently    Comment: 03/14/2013 "stopped drinking back in 2002; never had problem w/it"  . Drug use: No  . Sexual activity: Not Currently  Lifestyle  . Physical activity:    Days per week: Not on file    Minutes per session: Not on file  . Stress: Not on file  Relationships  . Social connections:    Talks on phone: Not on file    Gets together: Not on file    Attends religious service: Not on file    Active member of club or organization: Not on file    Attends meetings of clubs or organizations: Not on file    Relationship status: Not on file  . Intimate partner violence:    Fear of current or ex partner: Not on file    Emotionally abused: Not on file  Physically abused: Not on file    Forced sexual activity: Not on file  Other Topics Concern  . Not on file  Social History Narrative   Single.   Retired.    1 son, deceased.    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   Enjoys playing on the computer.        No Known Allergies   Constitutional:  Denies headache, fatigue, fever or abrupt weight changes.  HEENT:  Denies eye redness, eye pain, pressure behind the eyes, facial pain, nasal congestion, ear pain, ringing in the ears, wax buildup, runny nose or sore throat Respiratory: Positive cough. Denies difficulty breathing or shortness of breath.  Cardiovascular: Denies chest pain, chest tightness, palpitations or swelling in the hands or feet.   No other specific complaints in a complete review of systems (except as listed in HPI above).  Objective:   BP  106/70   Pulse 97   Temp 97.7 F (36.5 C) (Oral)   Wt 226 lb (102.5 kg)   SpO2 96%   BMI 31.52 kg/m  Wt Readings from Last 3 Encounters:  11/12/17 226 lb (102.5 kg)  10/29/17 228 lb 4 oz (103.5 kg)  10/26/17 226 lb (102.5 kg)     General: Appears his stated age,  in NAD. HEENT: Head: normal shape and size; Ears: Tm's gray and intact, normal light reflex;  Throat/Mouth: + PND. Teeth present, mucosa erythematous and moist, no exudate noted, no lesions or ulcerations noted.  Neck: No cervical lymphadenopathy.  Cardiovascular: Normal rate and rhythm.  Pulmonary/Chest: Normal effort and positive vesicular breath sounds. No respiratory distress. No wheezes, rales or ronchi noted.       Assessment & Plan:   Cough secondary to PND:  Get some rest and drink plenty of water No concern for COPD exacerbation or PNA at this time No indication for abx or steroids Start Mucinex 600 mg every 12 hours Continue Robitussin as needed for cough  RTC as needed or if symptoms persist.   Webb Silversmith, NP

## 2017-11-12 NOTE — Patient Instructions (Signed)

## 2017-11-15 ENCOUNTER — Telehealth: Payer: Self-pay | Admitting: *Deleted

## 2017-11-15 ENCOUNTER — Other Ambulatory Visit: Payer: Self-pay | Admitting: Primary Care

## 2017-11-15 ENCOUNTER — Telehealth: Payer: Self-pay | Admitting: Gastroenterology

## 2017-11-15 NOTE — Telephone Encounter (Signed)
Patient needs to reschedule his procedure due to another appt. Please call

## 2017-11-15 NOTE — Telephone Encounter (Signed)
Copied from [place] [id number]. Topic: Quick Communication - See Telephone Encounter >> Nov 15, 2017 11:43 AM Marja Kays F wrote: Pt is calling to let baity know that the mucinex is not helping with his head congestion and would like something else called in   Best number [phone]  [facility]

## 2017-11-15 NOTE — Telephone Encounter (Signed)
He was not c/o head congestion at his visit. If Flonase isn't doing the trick, would recommend Afrin for 3 days. May need reevaluation.

## 2017-11-16 ENCOUNTER — Telehealth: Payer: Self-pay

## 2017-11-16 NOTE — Telephone Encounter (Signed)
Please advise if pt is supposed to be taking, not on current list... But has been on in the past, also did not see that it was mentioned in last OV

## 2017-11-16 NOTE — Telephone Encounter (Signed)
Unable to leave voice message returning patients call to reschedule colonoscopy.  Thanks [employer]

## 2017-11-16 NOTE — Telephone Encounter (Signed)
Note from 06/2017 reviewed. Pt should be on Buproprion 150 mg BID. Refilled.

## 2017-11-17 ENCOUNTER — Other Ambulatory Visit: Payer: Self-pay | Admitting: Physician Assistant

## 2017-11-17 DIAGNOSIS — E1142 Type 2 diabetes mellitus with diabetic polyneuropathy: Secondary | ICD-10-CM

## 2017-11-20 ENCOUNTER — Ambulatory Visit: Admission: RE | Admit: 2017-11-20 | Payer: Medicare Other | Source: Ambulatory Visit | Admitting: Gastroenterology

## 2017-11-20 ENCOUNTER — Ambulatory Visit (INDEPENDENT_AMBULATORY_CARE_PROVIDER_SITE_OTHER): Payer: Medicare Other | Admitting: Urology

## 2017-11-20 ENCOUNTER — Encounter: Admission: RE | Payer: Self-pay | Source: Ambulatory Visit

## 2017-11-20 ENCOUNTER — Encounter: Payer: Self-pay | Admitting: Urology

## 2017-11-20 ENCOUNTER — Encounter

## 2017-11-20 VITALS — BP 96/67 | HR 99 | Ht 71.0 in | Wt 226.0 lb

## 2017-11-20 DIAGNOSIS — R972 Elevated prostate specific antigen [PSA]: Secondary | ICD-10-CM

## 2017-11-20 DIAGNOSIS — N401 Enlarged prostate with lower urinary tract symptoms: Secondary | ICD-10-CM | POA: Diagnosis not present

## 2017-11-20 DIAGNOSIS — R339 Retention of urine, unspecified: Secondary | ICD-10-CM

## 2017-11-20 DIAGNOSIS — N402 Nodular prostate without lower urinary tract symptoms: Secondary | ICD-10-CM | POA: Diagnosis not present

## 2017-11-20 DIAGNOSIS — N138 Other obstructive and reflux uropathy: Secondary | ICD-10-CM

## 2017-11-20 LAB — URINALYSIS, COMPLETE
Bilirubin, UA: NEGATIVE
Ketones, UA: NEGATIVE
Leukocytes, UA: NEGATIVE
Nitrite, UA: NEGATIVE
Protein, UA: NEGATIVE
RBC, UA: NEGATIVE
Specific Gravity, UA: 1.01 (ref 1.005–1.030)
Urobilinogen, Ur: 0.2 mg/dL (ref 0.2–1.0)
pH, UA: 5 (ref 5.0–7.5)

## 2017-11-20 LAB — BLADDER SCAN AMB NON-IMAGING

## 2017-11-20 LAB — MICROSCOPIC EXAMINATION

## 2017-11-20 SURGERY — COLONOSCOPY WITH PROPOFOL
Anesthesia: General

## 2017-11-20 MED ORDER — TAMSULOSIN HCL 0.4 MG PO CAPS: 0.4000 mg | ORAL_CAPSULE | Freq: Every day | ORAL | 11 refills | Status: DC

## 2017-11-20 NOTE — Progress Notes (Signed)
11/20/2017 11:08 AM   [name] Cook May 09, 1950 [id number]  Referring provider: [name], NP [place], [place] [address]  Chief Complaint  Patient presents with  . Elevated PSA    New Patient    HPI: 67 year old male referred for further evaluation of elevated PSA.  He underwent routine annual PSA screening by his primary care physician on 09/21/2017.  At this time, his PSA was noted to be elevated to 6.10.  This is up significantly from previous years where his baseline appears to be in the 4-5 range.  See trend below.    He does have baseline urinary urinary symptoms which are primarily obstructive in nature.  He feels like his stream is gotten weaker over the years.  In addition, he gets up at nighttime to urinate.  He does have untreated sleep apnea and is diabetic.  IPSS as below.  He does have a mildly elevated postvoid residual as well today.  No weight loss or bone pain.  No family history of prostate cancer.    He does have a significant cardiac history and is on?  Both Plavix and Coumadin by his cardiologist, [name].  He is never stop this medication for any reason.  His remote heart attack was in the remote past.  IPSS    Row Name 11/20/17 1000         International Prostate Symptom Score   How often have you had the sensation of not emptying your bladder?  Less than half the time     How often have you had to urinate less than every two hours?  More than half the time     How often have you found you stopped and started again several times when you urinated?  About half the time     How often have you found it difficult to postpone urination?  Not at All     How often have you had a weak urinary stream?  About half the time     How often have you had to strain to start urination?  Not at All     How many times did you typically get up at night to urinate?  3 Times     Total IPSS Score  15       Quality of Life due to urinary symptoms   If  you were to spend the rest of your life with your urinary condition just the way it is now how would you feel about that?  Mixed        Score:  1-7 Mild 8-19 Moderate 20-35 Severe    PMH: Past Medical History:  Diagnosis Date  . Arthritis    "knees and lower back" (03/14/2013)  . Atrial flutter ([place])    radiofrequency ablation in 2001  . CAD (coronary artery disease)    a. Nonobstructive. Cardiac cath in 2001-50% mid RI, normal LM, LAD, RCA b. cath 10/16/2014 95% mid RCA treated with DES, 99% ost D1 medical management due to small aneurysmal segment  . Chronic anticoagulation    chronic Coumadin anticoagulation  . Chronic obstructive pulmonary disease ([place]) 04/20/2011  . Diabetes mellitus, type 2 ([place])   . GERD (gastroesophageal reflux disease)   . Hyperlipidemia   . Hypertension    with hypertensive heart disease  . Obesity   . Persistent atrial fibrillation ([facility])    recurrent atrial flutter since 2001 s/p DCCVs, multiple failed AADs, h/o tachy-mediated cardiomyopathy  .  Shortness of breath    "can come on at any time" (03/14/2013)  . Sleep apnea    "dx'd; couldn't wear the mask" (03/14/2013)  . Tobacco abuse     Surgical History: Past Surgical History:  Procedure Laterality Date  . ATRIAL FLUTTER ABLATION  2002   atrial flutter; subsequently developed atrial fibrillation  . AV NODE ABLATION  01/24/2013  . CARDIAC CATHETERIZATION  2002  . CARDIAC CATHETERIZATION N/A 10/16/2014   Procedure: Left Heart Cath and Coronary Angiography;  Surgeon: [name], MD;  Location: [facility];  Service: Cardiovascular;  Laterality: N/A;  . CARDIOVERSION  05/31/2011   Procedure: CARDIOVERSION;  Surgeon: Cristopher Estimable. Lattie Haw, MD;  Location: [facility];  Service: Cardiovascular;  Laterality: N/A;  . CARPAL TUNNEL RELEASE Left 1980's  . INSERT / REPLACE / REMOVE PACEMAKER  01/24/2013    Medtronic Adapta L dual-chamber pacemaker, serial number [id number]   . LEFT HEART  CATHETERIZATION WITH CORONARY ANGIOGRAM N/A 03/17/2013   Procedure: LEFT HEART CATHETERIZATION WITH CORONARY ANGIOGRAM;  Surgeon: Burnell Blanks, MD; LAD mild dz, D1 branch 100%, inferior branch 99%, CFX OK, RCA 50%, EF 65%    . LOOP RECORDER IMPLANT  2002  . PERMANENT PACEMAKER INSERTION N/A 01/24/2013   Procedure: PERMANENT PACEMAKER INSERTION;  Surgeon: [name], MD;  Location: [facility];  Service: Cardiovascular;  Laterality: N/A;  . TIBIAL TUBERCLERPLASTY  ~ 2003    Home Medications:  Allergies as of 11/20/2017   No Known Allergies     Medication List        Accurate as of 11/20/17 11:08 AM. Always use your most recent med list.          amLODipine 5 MG tablet Commonly known as:  NORVASC TAKE 1 TABLET BY MOUTH EVERY DAY   blood glucose meter kit and supplies Kit Dispense based on patient and insurance preference. Use up to four times daily as directed. (FOR ICD-9 250.00, 250.01).   buPROPion 150 MG 12 hr tablet Commonly known as:  ZYBAN TAKE 1 TABLET BY MOUTH TWICE A DAY   celecoxib 200 MG capsule Commonly known as:  CELEBREX TAKE 1 CAPSULE BY MOUTH EVERY DAY   clopidogrel 75 MG tablet Commonly known as:  PLAVIX TAKE 1 TABLET BY MOUTH DAILY   escitalopram 10 MG tablet Commonly known as:  LEXAPRO TAKE 1 TABLET BY MOUTH EVERY DAY   fish oil-omega-3 fatty acids 1000 MG capsule Take 1 g by mouth daily.   fluticasone 50 MCG/ACT nasal spray Commonly known as:  FLONASE Place 1 spray into both nostrils daily as needed for allergies or rhinitis.   furosemide 20 MG tablet Commonly known as:  LASIX TAKE 1 TABLET BY MOUTH DAILY   gabapentin 300 MG capsule Commonly known as:  NEURONTIN TAKE 1 CAPSULE BY MOUTH THREE TIMES A DAY   glucose blood test strip Commonly known as:  ONE TOUCH ULTRA TEST USE TO TEST UP TO 4 TIMES DAILY   isosorbide mononitrate 30 MG 24 hr tablet Commonly known as:  IMDUR TAKE 1 TABLET BY MOUTH EVERY DAY   JARDIANCE 10 MG  Tabs tablet Generic drug:  empagliflozin TAKE 1 TABLET (10 MG) BY MOUTH EVERY MORNING.   lisinopril 20 MG tablet Commonly known as:  PRINIVIL,ZESTRIL TAKE 1 TABLET BY MOUTH EVERY DAY   metFORMIN 500 MG tablet Commonly known as:  GLUCOPHAGE TAKE 1 TABLET BY MOUTH TWICE DAILY WITH A MEAL   metoprolol succinate 50 MG 24  hr tablet Commonly known as:  TOPROL-XL Take 1 tablet (50 mg total) by mouth daily. Take with or immediately following a meal.   nitroGLYCERIN 0.4 MG SL tablet Commonly known as:  NITROSTAT Place 1 tablet (0.4 mg total) under the tongue every 5 (five) minutes as needed for chest pain.   pantoprazole 40 MG tablet Commonly known as:  PROTONIX TAKE 1 TABLET BY MOUTH ONCE DAILY   promethazine 25 MG tablet Commonly known as:  PHENERGAN Take 1 tablet (25 mg total) by mouth every 8 (eight) hours as needed for nausea or vomiting.   rosuvastatin 20 MG tablet Commonly known as:  CRESTOR TAKE 1 TABLET BY MOUTH BEFORE BEDTIME   sitaGLIPtin 100 MG tablet Commonly known as:  JANUVIA Take 1 tablet (100 mg total) by mouth daily.   tamsulosin 0.4 MG Caps capsule Commonly known as:  FLOMAX Take 1 capsule (0.4 mg total) by mouth daily.   traMADol 50 MG tablet Commonly known as:  ULTRAM Take 1 tablet (50 mg total) by mouth every 6 (six) hours as needed for severe pain. Caution of sedation   warfarin 5 MG tablet Commonly known as:  COUMADIN Take as directed by the anticoagulation clinic. If you are unsure how to take this medication, talk to your nurse or doctor. Original instructions:  TAKE 1 TABLET BY MOUTH ON MONDAYS AND 1/2 TABLET ALL OTHER DAYS   warfarin 1 MG tablet Commonly known as:  COUMADIN Take as directed by the anticoagulation clinic. If you are unsure how to take this medication, talk to your nurse or doctor. Original instructions:  TAKE 1 TABLET ON MONDAYS. PT. USES 1 MG TABLETS IN ADDITION TO 5 MG TABLETS.       Allergies: No Known Allergies  Family  History: Family History  Problem Relation Age of Onset  . Alzheimer's disease Mother   . Osteoporosis Mother     Social History:  reports that he quit smoking about 5 years ago. His smoking use included cigarettes. He has a 42.00 pack-year smoking history. He has never used smokeless tobacco. He reports that he drank alcohol. He reports that he does not use drugs.  ROS: UROLOGY Frequent Urination?: Yes Hard to postpone urination?: Yes Burning/pain with urination?: No Get up at night to urinate?: Yes Leakage of urine?: No Urine stream starts and stops?: Yes Trouble starting stream?: No Do you have to strain to urinate?: No Blood in urine?: Yes Urinary tract infection?: No Sexually transmitted disease?: No Injury to kidneys or bladder?: No Painful intercourse?: No Weak stream?: No Erection problems?: No Penile pain?: No  Gastrointestinal Nausea?: No Vomiting?: No Indigestion/heartburn?: Yes Diarrhea?: Yes Constipation?: No  Constitutional Fever: No Night sweats?: Yes Weight loss?: No Fatigue?: No  Skin Skin rash/lesions?: No Itching?: Yes  Eyes Blurred vision?: No Double vision?: No  Ears/Nose/Throat Sore throat?: No Sinus problems?: Yes  Hematologic/Lymphatic Swollen glands?: No Easy bruising?: Yes  Cardiovascular Leg swelling?: No Chest pain?: No  Respiratory Cough?: Yes Shortness of breath?: Yes  Endocrine Excessive thirst?: Yes  Musculoskeletal Back pain?: No Joint pain?: No  Neurological Headaches?: No Dizziness?: No  Psychologic Depression?: Yes Anxiety?: Yes  Physical Exam: BP 96/67   Pulse 99   Ht '5\' 11"'  (1.803 m)   Wt 226 lb (102.5 kg)   BMI 31.52 kg/m   Constitutional:  Alert and oriented, No acute distress. HEENT: [place] AT, moist mucus membranes.  Trachea midline, no masses. Cardiovascular: No clubbing, cyanosis, or edema. Respiratory: Normal respiratory effort,  no increased work of breathing. GI: Abdomen is soft,  nontender, nondistended, no abdominal masses GU: No CVA tenderness Rectal: Normal sphincter tone.  Abnormal rectal exam with a nodule at the right apex, firm, highly concerning. Skin: No rashes, bruises or suspicious lesions. Neurologic: Grossly intact, no focal deficits, moving all 4 extremities. Psychiatric: Flat affect.  Laboratory Data: Lab Results  Component Value Date   WBC 6.2 10/25/2016   HGB 15.1 10/25/2016   HCT 44.4 10/25/2016   MCV 95.3 10/25/2016   PLT 141 10/25/2016    Lab Results  Component Value Date   CREATININE 1.05 09/21/2017    Lab Results  Component Value Date   PSA 6.10 (H) 09/21/2017   PSA 4.33 (H) 10/27/2015   PSA 4.71 (H) 06/22/2014    Lab Results  Component Value Date   HGBA1C 6.7 (H) 09/21/2017    Urinalysis Urinalysis today with 2+ glucose, otherwise unremarkable.  See epic for details.  Pertinent Imaging: Results for orders placed or performed in visit on 11/20/17  BLADDER SCAN AMB NON-IMAGING  Result Value Ref Range   Scan Result 171m     Assessment & Plan:    1. Elevated PSA 67year old male with multiple medical problems with an elevated PSA to 6.1 from his previous baseline of the 4-5 range.   We reviewed the implications of an elevated PSA and the uncertainty surrounding it. In general, a man's PSA increases with age and is produced by both normal and cancerous prostate tissue. Differential for elevated PSA is BPH, prostate cancer, infection, recent intercourse/ejaculation, prostate infarction, recent urethroscopic manipulation (foley placement/cystoscopy) and prostatitis. Management of an elevated PSA can include observation or prostate biopsy and wediscussed this in detail. We discussed that indications for prostate biopsy are defined by age and race specific PSA cutoffs as well as a PSA velocity of 0.75/year.  Unfortunately, his rectal exam today was highly suspicious for prostate cancer with a nodule at the right apex.   Combined with his rising PSA, this is highly concerning.  I recommended a prostate biopsy.  He does have an extensive cardiac history and is on antiplatelet and anticoagulation by his cardiologist.  He will need to have clearance to hold these prior to biopsy which we will arrange.  We discussed prostate biopsy in detail including the procedure itself, the risks of blood in the urine, stool, and ejaculate, serious infection, and discomfort. He is willing to proceed with this as discussed.  - Urinalysis, Complete - PSA  2. Prostate nodule As above  3. BPH with obstruction/lower urinary tract symptoms Baseline urinary symptoms, likely multifactorial  In the setting of obstructive urinary symptoms and prostamegaly on rectal exam today, I have recommended trial of Flomax to see if this helps with his urinary symptoms We will reassess at next visit - BLADDER SCAN AMB NON-IMAGING  4. Incomplete bladder emptying Mildly elevated postvoid residual  Return for prostate biopsy- will need cardiac clearnance first.  AHollice Espy MD  [address] [place] [phone]

## 2017-11-21 ENCOUNTER — Other Ambulatory Visit: Payer: Self-pay | Admitting: Physician Assistant

## 2017-11-21 ENCOUNTER — Telehealth: Payer: Self-pay

## 2017-11-21 DIAGNOSIS — E1142 Type 2 diabetes mellitus with diabetic polyneuropathy: Secondary | ICD-10-CM

## 2017-11-21 LAB — PSA: Prostate Specific Ag, Serum: 7.4 ng/mL — ABNORMAL HIGH (ref 0.0–4.0)

## 2017-11-21 NOTE — Telephone Encounter (Signed)
   [facility] Pre-operative Risk Assessment    Request for surgical clearance:  1. What type of surgery is being performed?     - Prostate Biopsy  2. When is this surgery scheduled?     - 01/04/2018  3. Are there any medications that need to be held prior to surgery and how long?    - Plavix and Coumadin   - 7 days prior to procedure  4. Practice name and name of physician performing surgery?     - [facility]   - Dr. Hollice Espy  5. What is your office phone and fax number?     - Phone: [phone]   - Fax: [phone]  6. Anesthesia type (None, local, MAC, general) ?    - N/A   Velna Ochs 11/21/2017, 1:20 PM  _________________________________________________________________   (provider comments below)

## 2017-11-22 NOTE — Telephone Encounter (Signed)
[name], this pt needs prostate biopsy and request to hold warfarin and Plavix. Can you address Plavix please. I will have pharmacy address coumadin.  Please route response back to [facility] PREOP  Thanks,  Pecolia Ades, NP

## 2017-11-22 NOTE — Telephone Encounter (Signed)
Pt takes warfarin for afib with CHADS2VASc score of 5 (HF, HTN, age, DM, CAD). Ok to hold warfarin for 5 days prior to procedure. Will route to [facility] who manages patient's warfarin as an FYI that pt will be holding his warfarin for upcoming procedure.

## 2017-11-27 ENCOUNTER — Ambulatory Visit: Payer: Self-pay

## 2017-11-27 NOTE — Telephone Encounter (Signed)
Incoming call from patient with complaint of left side flank pain that radiates to the back, "sometimes takes his breath".  Pain is rated severe.  Pain is constant.  Denies abdominal pain vomiting, leg weakness.  Denies burning with urination or blood in urine.  During the conversation patient coughed  heard pt voice discomfort at that time.  Per protocol reccommended that patient go to ER.  Provided care advice. Patient voiced understanding. Patient states that he can drive himself.    Reason for Disposition . [1] SEVERE pain (e.g., excruciating, scale 8-10) AND [2] present > 1 hour  Answer Assessment - Initial Assessment Questions 1. LOCATION: "Where does it hurt?" (e.g., left, right)     Left side  back 2. ONSET: "When did the pain start?"     friday 3. SEVERITY: "How bad is the pain?" (e.g., Scale 1-10; mild, moderate, or severe)   - MILD (1-3): doesn't interfere with normal activities    - MODERATE (4-7): interferes with normal activities or awakens from sleep    - SEVERE (8-10): excruciating pain and patient unable to do normal activities (stays in bed)       Severe  4. PATTERN: "Does the pain come and go, or is it constant?"      constant 5. CAUSE: "What do you think is causing the pain?"     no 6. OTHER SYMPTOMS:  "Do you have any other symptoms?" (e.g., fever, abdominal pain, vomiting, leg weakness, burning with urination, blood in urine)     no 7. PREGNANCY:  "Is there any chance you are pregnant?" "When was your last menstrual period?"     na  Protocols used: FLANK PAIN-A-AH

## 2017-11-27 NOTE — Telephone Encounter (Signed)
Unable to reach pt by phone; no answer and no v/m. Per chart review do not see where pt went to ED.

## 2017-11-28 ENCOUNTER — Encounter: Payer: Self-pay | Admitting: Emergency Medicine

## 2017-11-28 ENCOUNTER — Emergency Department
Admission: EM | Admit: 2017-11-28 | Discharge: 2017-11-28 | Disposition: A | Discharge status: Home / Self Care | Payer: Medicare Other | Attending: Emergency Medicine | Admitting: Emergency Medicine

## 2017-11-28 ENCOUNTER — Other Ambulatory Visit: Payer: Self-pay

## 2017-11-28 ENCOUNTER — Emergency Department: Payer: Medicare Other

## 2017-11-28 DIAGNOSIS — I2510 Atherosclerotic heart disease of native coronary artery without angina pectoris: Secondary | ICD-10-CM | POA: Diagnosis not present

## 2017-11-28 DIAGNOSIS — Z7902 Long term (current) use of antithrombotics/antiplatelets: Secondary | ICD-10-CM | POA: Insufficient documentation

## 2017-11-28 DIAGNOSIS — Z7984 Long term (current) use of oral hypoglycemic drugs: Secondary | ICD-10-CM | POA: Diagnosis not present

## 2017-11-28 DIAGNOSIS — I4892 Unspecified atrial flutter: Secondary | ICD-10-CM | POA: Insufficient documentation

## 2017-11-28 DIAGNOSIS — Z79899 Other long term (current) drug therapy: Secondary | ICD-10-CM | POA: Diagnosis not present

## 2017-11-28 DIAGNOSIS — R1084 Generalized abdominal pain: Secondary | ICD-10-CM | POA: Diagnosis not present

## 2017-11-28 DIAGNOSIS — E119 Type 2 diabetes mellitus without complications: Secondary | ICD-10-CM | POA: Insufficient documentation

## 2017-11-28 DIAGNOSIS — I10 Essential (primary) hypertension: Secondary | ICD-10-CM | POA: Diagnosis not present

## 2017-11-28 DIAGNOSIS — Z7901 Long term (current) use of anticoagulants: Secondary | ICD-10-CM | POA: Diagnosis not present

## 2017-11-28 DIAGNOSIS — R109 Unspecified abdominal pain: Secondary | ICD-10-CM

## 2017-11-28 DIAGNOSIS — M545 Low back pain, unspecified: Secondary | ICD-10-CM

## 2017-11-28 DIAGNOSIS — Z87891 Personal history of nicotine dependence: Secondary | ICD-10-CM | POA: Diagnosis not present

## 2017-11-28 LAB — BASIC METABOLIC PANEL
Anion gap: 8 (ref 5–15)
BUN: 21 mg/dL (ref 8–23)
CO2: 26 mmol/L (ref 22–32)
Calcium: 8.7 mg/dL — ABNORMAL LOW (ref 8.9–10.3)
Chloride: 105 mmol/L (ref 98–111)
Creatinine, Ser: 1.16 mg/dL (ref 0.61–1.24)
GFR calc Af Amer: >60 mL/min (ref >60)
GFR calc non Af Amer: >60 mL/min (ref >60)
Glucose, Bld: 223 mg/dL — ABNORMAL HIGH (ref 70–99)
Potassium: 4.2 mmol/L (ref 3.5–5.1)
Sodium: 139 mmol/L (ref 135–145)

## 2017-11-28 LAB — URINALYSIS, COMPLETE (UACMP) WITH MICROSCOPIC
Bacteria, UA: NONE SEEN
Bilirubin Urine: NEGATIVE
Glucose, UA: >=500 mg/dL — AB
Hgb urine dipstick: NEGATIVE
Ketones, ur: NEGATIVE mg/dL
Leukocytes, UA: NEGATIVE
Nitrite: NEGATIVE
Protein, ur: NEGATIVE mg/dL
Specific Gravity, Urine: 1.014 (ref 1.005–1.030)
pH: 5 (ref 5.0–8.0)

## 2017-11-28 LAB — CBC
HCT: 43.3 % (ref 40.0–52.0)
Hemoglobin: 14.7 g/dL (ref 13.0–18.0)
MCH: 32.9 pg (ref 26.0–34.0)
MCHC: 33.9 g/dL (ref 32.0–36.0)
MCV: 97 fL (ref 80.0–100.0)
Platelets: 112 10*3/uL — ABNORMAL LOW (ref 150–440)
RBC: 4.46 MIL/uL (ref 4.40–5.90)
RDW: 14.4 % (ref 11.5–14.5)
WBC: 6.1 10*3/uL (ref 3.8–10.6)

## 2017-11-28 LAB — PROTIME-INR
INR: 2.77
Prothrombin Time: 29 seconds — ABNORMAL HIGH (ref 11.4–15.2)

## 2017-11-28 MED ORDER — NAPROXEN 500 MG PO TABS: 500.0000 mg | ORAL_TABLET | Freq: Two times a day (BID) | ORAL | 0 refills | Status: DC

## 2017-11-28 MED ORDER — SODIUM CHLORIDE 0.9 % IV BOLUS: 500.0000 mL | Freq: Once | INTRAVENOUS | Status: DC

## 2017-11-28 MED ORDER — LIDOCAINE 5 % EX PTCH: 1.0000 | MEDICATED_PATCH | Freq: Two times a day (BID) | CUTANEOUS | 0 refills | Status: DC

## 2017-11-28 MED ORDER — SODIUM CHLORIDE 0.9 % IV BOLUS
1000.0000 mL | Freq: Once | INTRAVENOUS | Status: AC
  Administered 2017-11-28: 1000 mL via INTRAVENOUS

## 2017-11-28 MED ORDER — IOHEXOL 300 MG/ML  SOLN
100.0000 mL | Freq: Once | INTRAMUSCULAR | Status: AC | PRN
  Administered 2017-11-28: 100 mL via INTRAVENOUS

## 2017-11-28 NOTE — Telephone Encounter (Signed)
Tried to call patient this morning and this afternoon. Could not get a hold of patient. The voicemail box is not set up.  [name] Kraft, do you want me to keep trying?

## 2017-11-28 NOTE — Telephone Encounter (Signed)
Noted. Patient is currently at the emergency department and has been triaged.  Let's call and check on him tomorrow afternoon.

## 2017-11-28 NOTE — ED Provider Notes (Signed)
[facility] Emergency Department Provider Note  ____________________________________________  Time seen: Approximately 8:39 PM  I have reviewed the triage vital signs and the nursing notes.   HISTORY  Chief Complaint Flank Pain and Dizziness    HPI [name] Cook is a 67 y.o. male with a history of CAD diabetes GERD hypertension who complains of left lower back pain for the past 5 days.  Intermittent, sharp.  Worse with movement and change in position.  No alleviating factors.  Nonradiating.  No nausea vomiting diarrhea constipation or urinary symptoms.  No recent trauma falls or heavy lifting.  Denies paresthesias or weakness in the legs.  No difficulty urinating or moving bowels.    Past Medical History:  Diagnosis Date  . Arthritis    "knees and lower back" (03/14/2013)  . Atrial flutter ([place])    radiofrequency ablation in 2001  . CAD (coronary artery disease)    a. Nonobstructive. Cardiac cath in 2001-50% mid RI, normal LM, LAD, RCA b. cath 10/16/2014 95% mid RCA treated with DES, 99% ost D1 medical management due to small aneurysmal segment  . Chronic anticoagulation    chronic Coumadin anticoagulation  . Chronic obstructive pulmonary disease ([place]) 04/20/2011  . Diabetes mellitus, type 2 ([place])   . GERD (gastroesophageal reflux disease)   . Hyperlipidemia   . Hypertension    with hypertensive heart disease  . Obesity   . Persistent atrial fibrillation (HCC)    recurrent atrial flutter since 2001 s/p DCCVs, multiple failed AADs, h/o tachy-mediated cardiomyopathy  . Shortness of breath    "can come on at any time" (03/14/2013)  . Sleep apnea    "dx'd; couldn't wear the mask" (03/14/2013)  . Tobacco abuse      Patient Active Problem List   Diagnosis Date Noted  . Left knee pain 10/25/2017  . OSA (obstructive sleep apnea) 10/01/2017  . Preventative health care 10/01/2017  . Osteoarthritis 06/26/2017  . Acute coronary syndrome ([place])   . Unstable  angina ([place]) 10/14/2014  . Insomnia 10/01/2014  . Depression 03/09/2014  . Elevated PSA 02/09/2014  . Coronary artery disease 11/06/2013  . Long term current use of anticoagulant therapy 06/19/2013  . Pacemaker 04/30/2013  . Acute myocardial infarction, subendocardial infarction, initial episode of care ([place]) 03/15/2013  . Hypokalemia 01/15/2013  . Abnormal LFTs 05/02/2012  . Chronic kidney disease, 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/26/2011  . Chronic obstructive pulmonary disease ([place]) 04/20/2011  . Atrial fibrillation   . Hypertension   . Hyperlipidemia   . Diabetes mellitus type 2, uncomplicated ([place])   . Atrial flutter ([place])   . Tobacco abuse   . Chronic anticoagulation 04/06/2011  . GASTROESOPHAGEAL REFLUX DISEASE 05/16/2010     Past Surgical History:  Procedure Laterality Date  . ATRIAL FLUTTER ABLATION  2002   atrial flutter; subsequently developed atrial fibrillation  . AV NODE ABLATION  01/24/2013  . CARDIAC CATHETERIZATION  2002  . CARDIAC CATHETERIZATION N/A 10/16/2014   Procedure: Left Heart Cath and Coronary Angiography;  Surgeon: [name], MD;  Location: [place] [facility];  Service: Cardiovascular;  Laterality: N/A;  . CARDIOVERSION  05/31/2011   Procedure: CARDIOVERSION;  Surgeon: Cristopher Estimable. Lattie Haw, MD;  Location: [facility];  Service: Cardiovascular;  Laterality: N/A;  . CARPAL TUNNEL RELEASE Left 1980's  . INSERT / REPLACE / REMOVE PACEMAKER  01/24/2013    Medtronic Adapta L dual-chamber pacemaker, serial number [id number]   . LEFT HEART CATHETERIZATION WITH CORONARY ANGIOGRAM N/A 03/17/2013  Procedure: LEFT HEART CATHETERIZATION WITH CORONARY ANGIOGRAM;  Surgeon: Burnell Blanks, MD; LAD mild dz, D1 branch 100%, inferior branch 99%, CFX OK, RCA 50%, EF 65%    . LOOP RECORDER IMPLANT  2002  . PERMANENT PACEMAKER INSERTION N/A 01/24/2013   Procedure: PERMANENT PACEMAKER INSERTION;  Surgeon: [name], MD;  Location: [facility] CATH LAB;  Service: Cardiovascular;   Laterality: N/A;  . TIBIAL TUBERCLERPLASTY  ~ 2003     Prior to Admission medications   Medication Sig Start Date End Date Taking? Authorizing Provider  amLODipine (NORVASC) 5 MG tablet TAKE 1 TABLET BY MOUTH EVERY DAY 06/14/17   Dena Billet B, PA-C  blood glucose meter kit and supplies KIT Dispense based on patient and insurance preference. Use up to four times daily as directed. (FOR ICD-9 250.00, 250.01). 10/01/17   [name], NP  buPROPion (ZYBAN) 150 MG 12 hr tablet TAKE 1 TABLET BY MOUTH TWICE A DAY 11/16/17   Jearld Fenton, NP  celecoxib (CELEBREX) 200 MG capsule TAKE 1 CAPSULE BY MOUTH EVERY DAY 06/05/17   Dena Billet B, PA-C  clopidogrel (PLAVIX) 75 MG tablet TAKE 1 TABLET BY MOUTH DAILY 06/05/17   [name], MD  escitalopram (LEXAPRO) 10 MG tablet TAKE 1 TABLET BY MOUTH EVERY DAY 10/08/17   Dena Billet B, PA-C  fish oil-omega-3 fatty acids 1000 MG capsule Take 1 g by mouth daily.      [provider]  fluticasone (FLONASE) 50 MCG/ACT nasal spray Place 1 spray into both nostrils daily as needed for allergies or rhinitis.    [provider]  furosemide (LASIX) 20 MG tablet TAKE 1 TABLET BY MOUTH DAILY 04/30/17   Dena Billet B, PA-C  gabapentin (NEURONTIN) 300 MG capsule TAKE 1 CAPSULE BY MOUTH THREE TIMES A DAY 08/20/17   Dixon, Stanton Kidney B, PA-C  glucose blood (ONE TOUCH ULTRA TEST) test strip USE TO TEST UP TO 4 TIMES DAILY 10/23/17   [name], NP  isosorbide mononitrate (IMDUR) 30 MG 24 hr tablet TAKE 1 TABLET BY MOUTH EVERY DAY 07/11/17   Dixon, Mary B, PA-C  JARDIANCE 10 MG TABS tablet TAKE 1 TABLET (10 MG) BY MOUTH EVERY MORNING. 08/13/17   Dena Billet B, PA-C  lidocaine (LIDODERM) 5 % Place 1 patch onto the skin every 12 (twelve) hours. Remove & Discard patch within 12 hours or as directed by MD 11/28/17   Carrie Mew, MD  lisinopril (PRINIVIL,ZESTRIL) 20 MG tablet TAKE 1 TABLET BY MOUTH EVERY DAY 10/12/17   Alycia Rossetti, MD  metFORMIN  (GLUCOPHAGE) 500 MG tablet TAKE 1 TABLET BY MOUTH TWICE DAILY WITH A MEAL 10/02/17   [name], NP  metoprolol succinate (TOPROL-XL) 50 MG 24 hr tablet Take 1 tablet (50 mg total) by mouth daily. Take with or immediately following a meal. 05/22/17 10/26/17  [name], MD  naproxen (NAPROSYN) 500 MG tablet Take 1 tablet (500 mg total) by mouth 2 (two) times daily with a meal. 11/28/17   Carrie Mew, MD  nitroGLYCERIN (NITROSTAT) 0.4 MG SL tablet Place 1 tablet (0.4 mg total) under the tongue every 5 (five) minutes as needed for chest pain. 03/17/16   Dena Billet B, PA-C  pantoprazole (PROTONIX) 40 MG tablet TAKE 1 TABLET BY MOUTH ONCE DAILY 07/11/17   Orlena Sheldon, PA-C  promethazine (PHENERGAN) 25 MG tablet Take 1 tablet (25 mg total) by mouth every 8 (eight) hours as needed for nausea or vomiting.  03/28/17   Dena Billet B, PA-C  rosuvastatin (CRESTOR) 20 MG tablet TAKE 1 TABLET BY MOUTH BEFORE BEDTIME 11/12/17   Dena Billet B, PA-C  sitaGLIPtin (JANUVIA) 100 MG tablet Take 1 tablet (100 mg total) by mouth daily. 11/17/16   Orlena Sheldon, PA-C  tamsulosin (FLOMAX) 0.4 MG CAPS capsule Take 1 capsule (0.4 mg total) by mouth daily. 11/20/17   Hollice Espy, MD  traMADol (ULTRAM) 50 MG tablet Take 1 tablet (50 mg total) by mouth every 6 (six) hours as needed for severe pain. Caution of sedation 11/01/17   Copland, Frederico Hamman, MD  warfarin (COUMADIN) 1 MG tablet TAKE 1 TABLET ON MONDAYS. PT. USES 1 MG TABLETS IN ADDITION TO 5 MG TABLETS. 08/08/17   [name], NP  warfarin (COUMADIN) 5 MG tablet TAKE 1 TABLET BY MOUTH ON MONDAYS AND 1/2 TABLET ALL OTHER DAYS 12/25/16   Orlena Sheldon, PA-C     Allergies Patient has no known allergies.   Family History  Problem Relation Age of Onset  . Alzheimer's disease Mother   . Osteoporosis Mother     Social History Social History   Tobacco Use  . Smoking status: Former Smoker    Packs/day: 1.00    Years: 42.00    Pack years: 42.00     Types: Cigarettes    Last attempt to quit: 12/31/2011    Years since quitting: 5.9  . Smokeless tobacco: Never Used  Substance Use Topics  . Alcohol use: Not Currently    Comment: 03/14/2013 "stopped drinking back in 2002; never had problem w/it"  . Drug use: No    Review of Systems  Constitutional:   No fever or chills.  ENT:   No sore throat. No rhinorrhea. Cardiovascular:   No chest pain or syncope. Respiratory:   No dyspnea or cough. Gastrointestinal:   Negative for abdominal pain, vomiting and diarrhea.  Musculoskeletal:   Positive for low back pain as above. All other systems reviewed and are negative except as documented above in ROS and HPI.  ____________________________________________   PHYSICAL EXAM:  VITAL SIGNS: ED Triage Vitals [11/28/17 1440]  Enc Vitals Group     BP 106/64     Pulse Rate 90     Resp 18     Temp 98.1 F (36.7 C)     Temp Source Oral     SpO2 94 %     Weight 226 lb (102.5 kg)     Height _0  (1.803 m)     Head Circumference      Peak Flow      Pain Score 9     Pain Loc      Pain Edu?      Excl. in [place]?     Vital signs reviewed, nursing assessments reviewed.   Constitutional:   Alert and oriented. Non-toxic appearance. Eyes:   Conjunctivae are normal. EOMI. PERRL. ENT      Head:   Normocephalic and atraumatic.      Nose:   No congestion/rhinnorhea.       Mouth/Throat:   MMM, no pharyngeal erythema. No peritonsillar mass.       Neck:   No meningismus. Full ROM. Hematological/Lymphatic/Immunilogical:   No cervical lymphadenopathy. Cardiovascular:   RRR. Symmetric bilateral radial and DP pulses.  No murmurs. Cap refill less than 2 seconds. Respiratory:   Normal respiratory effort without tachypnea/retractions. Breath sounds are clear and equal bilaterally. No wheezes/rales/rhonchi. Gastrointestinal:   Soft and nontender. Non  distended. There is no CVA tenderness.  No rebound, rigidity, or guarding. Musculoskeletal:   Normal range  of motion in all extremities. No joint effusions.  No lower extremity tenderness.  No edema.  There is tenderness in the left lower back and left flank area in the musculature.  No midline spinal tenderness. Neurologic:   Normal speech and language.  Motor grossly intact. No acute focal neurologic deficits are appreciated.  Skin:    Skin is warm, dry and intact. No rash noted.  No petechiae, purpura, or bullae.  ____________________________________________    LABS (pertinent positives/negatives) (all labs ordered are listed, but only abnormal results are displayed) Labs Reviewed  URINALYSIS, COMPLETE (UACMP) WITH MICROSCOPIC - Abnormal; Notable for the following components:      Result Value   Color, Urine YELLOW (*)    APPearance CLEAR (*)    Glucose, UA >=500 (*)    All other components within normal limits  BASIC METABOLIC PANEL - Abnormal; Notable for the following components:   Glucose, Bld 223 (*)    Calcium 8.7 (*)    All other components within normal limits  CBC - Abnormal; Notable for the following components:   Platelets 112 (*)    All other components within normal limits  PROTIME-INR - Abnormal; Notable for the following components:   Prothrombin Time 29.0 (*)    All other components within normal limits   ____________________________________________   EKG  Interpreted by me Ventricular paced rhythm, rate of 75.  Left axis, left bundle branch block.  No acute ischemic changes.  ____________________________________________    RADIOLOGY  Ct Abdomen Pelvis W Contrast  Result Date: 11/28/2017 CLINICAL DATA:  Left flank and left lower quadrant pain since Friday. Intermittent. EXAM: CT ABDOMEN AND PELVIS WITH CONTRAST TECHNIQUE: Multidetector CT imaging of the abdomen and pelvis was performed using the standard protocol following bolus administration of intravenous contrast. CONTRAST:  171m OMNIPAQUE IOHEXOL 300 MG/ML  SOLN COMPARISON:  05/10/2012 abdominal  ultrasound.  No prior CT. FINDINGS: Lower chest: Clear lung bases. Normal heart size without pericardial or pleural effusion. Incompletely imaged pacer. Hepatobiliary: Normal liver. Normal gallbladder, without biliary ductal dilatation. Pancreas: Normal, without mass or ductal dilatation. Spleen: Normal in size, without focal abnormality. Adrenals/Urinary Tract: Normal adrenal glands. Minimal artifact degradation in the mid abdomen secondary to overlying wires and leads, possibly with concurrent motion. Normal kidneys, without hydronephrosis. Normal urinary bladder. Stomach/Bowel: The proximal stomach is underdistended. Apparent wall thickening is at least partially secondary, including on 18/2. Scattered colonic diverticula. Normal terminal ileum. Appendix not visualized. Normal small bowel. Vascular/Lymphatic: Aortic and branch vessel atherosclerosis. Infrarenal abdominal aortic ectasia at 3.4 cm on 45/2. No abdominopelvic adenopathy. Reproductive: Normal prostate. Other: No significant free fluid. Tiny fat containing left inguinal hernia. Musculoskeletal: Lumbosacral spondylosis. IMPRESSION: 1.  No acute process or explanation for left lower quadrant pain. 2. Mild multifactorial degradation. 3. Aortic Atherosclerosis (ICD10-I70.0). Mild infrarenal aortic ectasia. 4. Apparent gastric wall thickening proximally is felt to at least partially be due to underdistention. Correlate with any symptoms of gastritis or other gastric pathology. If any such symptoms, consider endoscopy. Electronically Signed   By: KAbigail MiyamotoM.D.   On: 11/28/2017 19:00    ____________________________________________   PROCEDURES Procedures  ____________________________________________  DIFFERENTIAL DIAGNOSIS   Diverticulitis, kidney stone, colitis, AAA  CLINICAL IMPRESSION / ASSESSMENT AND PLAN / ED COURSE  Pertinent labs & imaging results that were available during my care of the patient were reviewed by me and considered  in my medical decision making (see chart for details).    Patient presents with left flank pain, intermittent, at times severe.  Also noted some occasional dizziness.  Here his vital signs are normal.  He is not in distress and exam is overall reassuring.  However, due to age, reported dizziness symptoms, I will obtain a CT scan to ensure he does not have significant aortic disease or other intra-abdominal pathology.  Clinical Course as of Nov 28 2037  Wed Nov 28, 2017  2037 Work-up entirely reassuring, consistent with a musculoskeletal pain.  Suitable for discharge home and outpatient follow-up.  No evidence of vascular injury colitis or perforation.   [PS]    Clinical Course User Index [PS] Carrie Mew, MD     ____________________________________________   FINAL CLINICAL IMPRESSION(S) / ED DIAGNOSES    Final diagnoses:  Left flank pain  Acute left-sided low back pain without sciatica     ED Discharge Orders         Ordered    naproxen (NAPROSYN) 500 MG tablet  2 times daily with meals     11/28/17 2039    lidocaine (LIDODERM) 5 %  Every 12 hours     11/28/17 2039          Portions of this note were generated with dragon dictation software. Dictation errors may occur despite best attempts at proofreading.    Carrie Mew, MD 11/28/17 2043

## 2017-11-28 NOTE — ED Notes (Signed)
Patient transported to CT 

## 2017-11-28 NOTE — ED Triage Notes (Signed)
Pt arrived via POV by himself with reports of left flank pain since Friday. Pt describes the pain as intermittent and stabbing pain, 9/10 on the pain scale.  Pt states he also has been getting dizzy off and on and states that his blood pressure sometimes gets lower than what it is today 106/64. Pt states over the past few days he has been more sluggish.

## 2017-11-28 NOTE — Discharge Instructions (Signed)
Your blood tests and CT scan were okay today.  No evidence of any acute issues inside your abdomen.  This appears to be due to a muscle strain and spasm of your lower back.  Take anti-inflammatory medicine, apply heating pad.  Lidocaine patches can be helpful as well.  Please follow-up with your doctor for continued monitoring of your symptoms.

## 2017-11-29 NOTE — Telephone Encounter (Signed)
Noted. Patient made an ED follow appt with Allie Bossier on 11/30/2017

## 2017-11-30 ENCOUNTER — Encounter: Payer: Self-pay | Admitting: Primary Care

## 2017-11-30 ENCOUNTER — Ambulatory Visit (INDEPENDENT_AMBULATORY_CARE_PROVIDER_SITE_OTHER): Payer: Medicare Other | Admitting: Primary Care

## 2017-11-30 DIAGNOSIS — M150 Primary generalized (osteo)arthritis: Secondary | ICD-10-CM

## 2017-11-30 DIAGNOSIS — M159 Polyosteoarthritis, unspecified: Secondary | ICD-10-CM

## 2017-11-30 DIAGNOSIS — M8949 Other hypertrophic osteoarthropathy, multiple sites: Secondary | ICD-10-CM

## 2017-11-30 NOTE — Patient Instructions (Signed)
Stop taking celebrex for arthritis. Use tylenol instead. You have a high risk for bleeding when taking Plavix, Celebrex, and Coumadin.  Only use naproxen for a few more days, then stop.  Try using the lidiocaine patches over the counter for back pain. You can also apply a heating pad or ice.   Make sure to stretch daily.  It was a pleasure to see you today!

## 2017-11-30 NOTE — Assessment & Plan Note (Signed)
Discussed to stop using Celebrex as he's already on Plavix and Coumadin. Recommended he discontinue Naproxen after few more days given bleeding risks.   He will try Tylenol and/or topical agents PRN, also discussed ice/heat.

## 2017-11-30 NOTE — Progress Notes (Signed)
Subjective:    Patient ID: [name] Cook, male    DOB: 06/24/1950, 67 y.o.   MRN: [id number]  HPI  [name] Cook is a 67 year old male who presents today for emergency department follow up.  Presented to [facility] ED on 11/28/17 with a chief complaint of flank pain and dizziness. He described his pain as sharp, worse with movement and change in position. He denied urinary symptoms, GI symptoms, trauma.  During his ED stay he underwent ECG with paced rhythm of 75. Left axis, LBBB, no acute changes. He also underwent CT abdomen/pelvis which was negative for any acute process. His symptoms were presumed to be secondary to MSK involvement. He was discharged home with a prescription for naproxen 500 mg and lidocaine patches.   Since his ED visit his symptoms have improved. He's taking the naproxen every 12 hours. He describes his pain as intermittent sharp/stabbing pain which is overall improved. He denies dysuria, hematuria, groin pain, numbness/tingling, changes in bowel/bladder habits.   He does take Celebrex, Plavix, and Coumadin daily. Celebrex was prescribed by his prior PCP.   Review of Systems  Genitourinary: Negative for dysuria, frequency and hematuria.  Musculoskeletal: Positive for myalgias. Negative for back pain.  Hematological: Negative for adenopathy.       Past Medical History:  Diagnosis Date  . Arthritis    "knees and lower back" (03/14/2013)  . Atrial flutter ([place])    radiofrequency ablation in 2001  . CAD (coronary artery disease)    a. Nonobstructive. Cardiac cath in 2001-50% mid RI, normal LM, LAD, RCA b. cath 10/16/2014 95% mid RCA treated with DES, 99% ost D1 medical management due to small aneurysmal segment  . Chronic anticoagulation    chronic Coumadin anticoagulation  . Chronic obstructive pulmonary disease ([place]) 04/20/2011  . Diabetes mellitus, type 2 ([place])   . GERD (gastroesophageal reflux disease)   . Hyperlipidemia   . Hypertension    with hypertensive heart  disease  . Obesity   . Persistent atrial fibrillation (HCC)    recurrent atrial flutter since 2001 s/p DCCVs, multiple failed AADs, h/o tachy-mediated cardiomyopathy  . Shortness of breath    "can come on at any time" (03/14/2013)  . Sleep apnea    "dx'd; couldn't wear the mask" (03/14/2013)  . Tobacco abuse      Social History   Socioeconomic History  . Marital status: Legally Separated    Spouse name: Not on file  . Number of children: 1  . Years of education: Not on file  . Highest education level: Not on file  Occupational History  . Occupation: Merchandiser, retail: UNEMPLOYED  Social Needs  . Financial resource strain: Not on file  . Food insecurity:    Worry: Not on file    Inability: Not on file  . Transportation needs:    Medical: Not on file    Non-medical: Not on file  Tobacco Use  . Smoking status: Former Smoker    Packs/day: 1.00    Years: 42.00    Pack years: 42.00    Types: Cigarettes    Last attempt to quit: 12/31/2011    Years since quitting: 5.9  . Smokeless tobacco: Never Used  Substance and Sexual Activity  . Alcohol use: Not Currently    Comment: 03/14/2013 "stopped drinking back in 2002; never had problem w/it"  . Drug use: No  . Sexual activity: Not Currently  Lifestyle  . Physical activity:    Days  per week: Not on file    Minutes per session: Not on file  . Stress: Not on file  Relationships  . Social connections:    Talks on phone: Not on file    Gets together: Not on file    Attends religious service: Not on file    Active member of club or organization: Not on file    Attends meetings of clubs or organizations: Not on file    Relationship status: Not on file  . Intimate partner violence:    Fear of current or ex partner: Not on file    Emotionally abused: Not on file    Physically abused: Not on file    Forced sexual activity: Not on file  Other Topics Concern  . Not on file  Social History Narrative   Single.   Retired.      1 son, deceased.    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   Enjoys playing on the computer.        Past Surgical History:  Procedure Laterality Date  . ATRIAL FLUTTER ABLATION  2002   atrial flutter; subsequently developed atrial fibrillation  . AV NODE ABLATION  01/24/2013  . CARDIAC CATHETERIZATION  2002  . CARDIAC CATHETERIZATION N/A 10/16/2014   Procedure: Left Heart Cath and Coronary Angiography;  Surgeon: [name], MD;  Location: [place] [facility];  Service: Cardiovascular;  Laterality: N/A;  . CARDIOVERSION  05/31/2011   Procedure: CARDIOVERSION;  Surgeon: Cristopher Estimable. Lattie Haw, MD;  Location: [facility];  Service: Cardiovascular;  Laterality: N/A;  . CARPAL TUNNEL RELEASE Left 1980's  . INSERT / REPLACE / REMOVE PACEMAKER  01/24/2013    Medtronic Adapta L dual-chamber pacemaker, serial number [id number]   . LEFT HEART CATHETERIZATION WITH CORONARY ANGIOGRAM N/A 03/17/2013   Procedure: LEFT HEART CATHETERIZATION WITH CORONARY ANGIOGRAM;  Surgeon: Burnell Blanks, MD; LAD mild dz, D1 branch 100%, inferior branch 99%, CFX OK, RCA 50%, EF 65%    . LOOP RECORDER IMPLANT  2002  . PERMANENT PACEMAKER INSERTION N/A 01/24/2013   Procedure: PERMANENT PACEMAKER INSERTION;  Surgeon: [name], MD;  [facility] CATH LAB;  Service: Cardiovascular;  Laterality: N/A;  . TIBIAL TUBERCLERPLASTY  ~ 2003    Family History  Problem Relation Age of Onset  . Alzheimer's disease Mother   . Osteoporosis Mother     No Known Allergies  Current Outpatient Medications on File Prior to Visit  Medication Sig Dispense Refill  . amLODipine (NORVASC) 5 MG tablet TAKE 1 TABLET BY MOUTH EVERY DAY 90 tablet 1  . blood glucose meter kit and supplies KIT Dispense based on patient and insurance preference. Use up to four times daily as directed. (FOR ICD-9 250.00, 250.01). 1 each 0  . buPROPion (ZYBAN) 150 MG 12 hr tablet TAKE 1 TABLET BY MOUTH TWICE A  DAY 180 tablet 1  . celecoxib (CELEBREX) 200 MG capsule TAKE 1 CAPSULE BY MOUTH EVERY DAY 90 capsule 1  . clopidogrel (PLAVIX) 75 MG tablet TAKE 1 TABLET BY MOUTH DAILY 90 tablet 3  . escitalopram (LEXAPRO) 10 MG tablet TAKE 1 TABLET BY MOUTH EVERY DAY 90 tablet 1  . fish oil-omega-3 fatty acids 1000 MG capsule Take 1 g by mouth daily.      . fluticasone (FLONASE) 50 MCG/ACT nasal spray Place 1 spray into both nostrils daily as needed for allergies or rhinitis.    . furosemide (LASIX) 20 MG tablet TAKE  1 TABLET BY MOUTH DAILY 90 tablet 2  . gabapentin (NEURONTIN) 300 MG capsule TAKE 1 CAPSULE BY MOUTH THREE TIMES A DAY 270 capsule 0  . glucose blood (ONE TOUCH ULTRA TEST) test strip USE TO TEST UP TO 4 TIMES DAILY 100 each 2  . isosorbide mononitrate (IMDUR) 30 MG 24 hr tablet TAKE 1 TABLET BY MOUTH EVERY DAY 90 tablet 1  . JARDIANCE 10 MG TABS tablet TAKE 1 TABLET (10 MG) BY MOUTH EVERY MORNING. 90 tablet 1  . lidocaine (LIDODERM) 5 % Place 1 patch onto the skin every 12 (twelve) hours. Remove & Discard patch within 12 hours or as directed by MD 15 patch 0  . lisinopril (PRINIVIL,ZESTRIL) 20 MG tablet TAKE 1 TABLET BY MOUTH EVERY DAY 90 tablet 1  . metFORMIN (GLUCOPHAGE) 500 MG tablet TAKE 1 TABLET BY MOUTH TWICE DAILY WITH A MEAL 180 tablet 1  . naproxen (NAPROSYN) 500 MG tablet Take 1 tablet (500 mg total) by mouth 2 (two) times daily with a meal. 20 tablet 0  . nitroGLYCERIN (NITROSTAT) 0.4 MG SL tablet Place 1 tablet (0.4 mg total) under the tongue every 5 (five) minutes as needed for chest pain. 25 tablet 1  . pantoprazole (PROTONIX) 40 MG tablet TAKE 1 TABLET BY MOUTH ONCE DAILY 90 tablet 3  . promethazine (PHENERGAN) 25 MG tablet Take 1 tablet (25 mg total) by mouth every 8 (eight) hours as needed for nausea or vomiting. 20 tablet 0  . rosuvastatin (CRESTOR) 20 MG tablet TAKE 1 TABLET BY MOUTH BEFORE BEDTIME 90 tablet 1  . sitaGLIPtin (JANUVIA) 100 MG tablet Take 1 tablet (100 mg total) by  mouth daily. 90 tablet 3  . tamsulosin (FLOMAX) 0.4 MG CAPS capsule Take 1 capsule (0.4 mg total) by mouth daily. 30 capsule 11  . traMADol (ULTRAM) 50 MG tablet Take 1 tablet (50 mg total) by mouth every 6 (six) hours as needed for severe pain. Caution of sedation 20 tablet 0  . warfarin (COUMADIN) 1 MG tablet TAKE 1 TABLET ON MONDAYS. PT. USES 1 MG TABLETS IN ADDITION TO 5 MG TABLETS. 90 tablet 0  . warfarin (COUMADIN) 5 MG tablet TAKE 1 TABLET BY MOUTH ON MONDAYS AND 1/2 TABLET ALL OTHER DAYS 90 tablet 3  . metoprolol succinate (TOPROL-XL) 50 MG 24 hr tablet Take 1 tablet (50 mg total) by mouth daily. Take with or immediately following a meal. 90 tablet 3   No current facility-administered medications on file prior to visit.     BP 110/68   Pulse 97   Temp 97.7 F (36.5 C) (Oral)   Wt 229 lb (103.9 kg)   SpO2 96%   BMI 31.94 kg/m    Objective:   Physical Exam  Constitutional: He appears well-nourished.  Cardiovascular: Normal rate and regular rhythm.  Respiratory: Effort normal and breath sounds normal.  Musculoskeletal:       Back:  Ambulating well in clinic without assistive device. Getting up and down from exam table well. Tenderness upon moderate palpation to left lateral/lower flank  Skin: Skin is warm and dry.           Assessment & Plan:  Emergency Department Follow Up:  Flank pain to left side x 5 days. Evaluated in the ED, underwent CT scan which was unremarkable for an acute problem.  Agree that symptoms are likely MSK as he did have some tenderness upon moderate palpation during exam.  Advised he stop using Celebrex with Coumadin and  Plavix, discussed high risk for bleeding.  Also advised he just use the Naproxen for short term then stop and avoid any other NSAID's. He will try tylenol and lidocaine patches if needed. Follow up PRN.  Hospital notes, labs, imaging reviewed.   [name], NP

## 2017-12-07 NOTE — Telephone Encounter (Signed)
Discussed with [name] and pt may proceed with prostate biopsy and ok to hold coumadin and plavix for 5 days prior to procedure and resume afterwards per Dr. Erlene Quan.      Primary Cardiologist: No primary care provider on file.  Chart reviewed as part of pre-operative protocol coverage. Given past medical history and time since last visit, based on ACC/AHA guidelines, [name] Cook would be at acceptable risk for the planned procedure without further cardiovascular testing.   I will route this recommendation to the requesting party via Epic fax function and remove from pre-op pool.  Please call with questions.  Cecilie Kicks, NP 12/07/2017, 5:28 PM

## 2017-12-08 ENCOUNTER — Other Ambulatory Visit: Payer: Self-pay | Admitting: Physician Assistant

## 2017-12-08 DIAGNOSIS — E1142 Type 2 diabetes mellitus with diabetic polyneuropathy: Secondary | ICD-10-CM

## 2017-12-08 NOTE — Telephone Encounter (Signed)
Please let this patient know okay to stop Coumadin Plavix 5 days prior to procedure.  Hollice Espy, MD

## 2017-12-09 NOTE — Progress Notes (Signed)
Dr. Frederico Hamman T. Kayliana Codd, MD, [facility] [place], [address] Phone: [phone] Fax: [phone]  12/10/2017  Patient: [name] Cook, MRN: [id number], DOB: 24-[name]-1951, 66 y.o.  Primary Physician:  [name], NP   Chief Complaint  Patient presents with  . Follow-up    Left Knee   Subjective:   [name] Cook is a 67 y.o. very pleasant male patient who presents with the following:  Patient is here for 6-week recheck on his left knee.  He actually has been in and out of the medical office multiple times since then from various things and conditions.  He was in the ER on August 14 for flank pain as well as some generalized aches and pains where he followed up with Ms. Carlis Abbott.  He is on Coumadin as well as Plavix, and it appears that he has used some Celebrex as well as Naprosyn.  He is here for me to recheck his left knee. He feels much better here.   80/50 BP He feels bad and lightheaded. My recheck confirms his BP.  Past Medical History, Surgical History, Social History, Family History, Problem List, Medications, and Allergies have been reviewed and updated if relevant.  Patient Active Problem List   Diagnosis Date Noted  . Left knee pain 10/25/2017  . OSA (obstructive sleep apnea) 10/01/2017  . Preventative health care 10/01/2017  . Osteoarthritis 06/26/2017  . Acute coronary syndrome ([place])   . Unstable angina ([place]) 10/14/2014  . Insomnia 10/01/2014  . Depression 03/09/2014  . Elevated PSA 02/09/2014  . Coronary artery disease 11/06/2013  . Long term current use of anticoagulant therapy 06/19/2013  . Pacemaker 04/30/2013  . Acute myocardial infarction, subendocardial infarction, initial episode of care ([place]) 03/15/2013  . Hypokalemia 01/15/2013  . Abnormal LFTs 05/02/2012  . Chronic kidney disease, 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/26/2011  . Chronic obstructive pulmonary disease ([place]) 04/20/2011  . Atrial fibrillation    . Hypertension   . Hyperlipidemia   . Diabetes mellitus type 2, uncomplicated ([place])   . Atrial flutter ([place])   . Tobacco abuse   . Chronic anticoagulation 04/06/2011  . GASTROESOPHAGEAL REFLUX DISEASE 05/16/2010    Past Medical History:  Diagnosis Date  . Arthritis    "knees and lower back" (03/14/2013)  . Atrial flutter ([place])    radiofrequency ablation in 2001  . CAD (coronary artery disease)    a. Nonobstructive. Cardiac cath in 2001-50% mid RI, normal LM, LAD, RCA b. cath 10/16/2014 95% mid RCA treated with DES, 99% ost D1 medical management due to small aneurysmal segment  . Chronic anticoagulation    chronic Coumadin anticoagulation  . Chronic obstructive pulmonary disease ([place]) 04/20/2011  . Diabetes mellitus, type 2 ([place])   . GERD (gastroesophageal reflux disease)   . Hyperlipidemia   . Hypertension    with hypertensive heart disease  . Obesity   . Persistent atrial fibrillation (HCC)    recurrent atrial flutter since 2001 s/p DCCVs, multiple failed AADs, h/o tachy-mediated cardiomyopathy  . Shortness of breath    "can come on at any time" (03/14/2013)  . Sleep apnea    "dx'd; couldn't wear the mask" (03/14/2013)  . Tobacco abuse     Past Surgical History:  Procedure Laterality Date  . ATRIAL FLUTTER ABLATION  2002   atrial flutter; subsequently developed atrial fibrillation  . AV NODE ABLATION  01/24/2013  . CARDIAC CATHETERIZATION  2002  . CARDIAC CATHETERIZATION  N/A 10/16/2014   Procedure: Left Heart Cath and Coronary Angiography;  Surgeon: [name], MD;  Location: [place];  Service: Cardiovascular;  Laterality: N/A;  . CARDIOVERSION  05/31/2011   Procedure: CARDIOVERSION;  Surgeon: Cristopher Estimable. Lattie Haw, MD;  Location: [facility];  Service: Cardiovascular;  Laterality: N/A;  . CARPAL TUNNEL RELEASE Left 1980's  . INSERT / REPLACE / REMOVE PACEMAKER  01/24/2013    Medtronic Adapta L dual-chamber pacemaker, serial number NWE [id number]   . LEFT HEART  CATHETERIZATION WITH CORONARY ANGIOGRAM N/A 03/17/2013   Procedure: LEFT HEART CATHETERIZATION WITH CORONARY ANGIOGRAM;  Surgeon: Burnell Blanks, MD; LAD mild dz, D1 branch 100%, inferior branch 99%, CFX OK, RCA 50%, EF 65%    . LOOP RECORDER IMPLANT  2002  . PERMANENT PACEMAKER INSERTION N/A 01/24/2013   Procedure: PERMANENT PACEMAKER INSERTION;  Surgeon: [name], MD;  Location: [facility] CATH LAB;  Service: Cardiovascular;  Laterality: N/A;  . TIBIAL TUBERCLERPLASTY  ~ 2003    Social History   Socioeconomic History  . Marital status: Legally Separated    Spouse name: Not on file  . Number of children: 1  . Years of education: Not on file  . Highest education level: Not on file  Occupational History  . Occupation: Merchandiser, retail: UNEMPLOYED  Social Needs  . Financial resource strain: Not on file  . Food insecurity:    Worry: Not on file    Inability: Not on file  . Transportation needs:    Medical: Not on file    Non-medical: Not on file  Tobacco Use  . Smoking status: Former Smoker    Packs/day: 1.00    Years: 42.00    Pack years: 42.00    Types: Cigarettes    Last attempt to quit: 12/31/2011    Years since quitting: 5.9  . Smokeless tobacco: Never Used  Substance and Sexual Activity  . Alcohol use: Not Currently    Comment: 03/14/2013 "stopped drinking back in 2002; never had problem w/it"  . Drug use: No  . Sexual activity: Not Currently  Lifestyle  . Physical activity:    Days per week: Not on file    Minutes per session: Not on file  . Stress: Not on file  Relationships  . Social connections:    Talks on phone: Not on file    Gets together: Not on file    Attends religious service: Not on file    Active member of club or organization: Not on file    Attends meetings of clubs or organizations: Not on file    Relationship status: Not on file  . Intimate partner violence:    Fear of current or ex partner: Not on file    Emotionally abused: Not  on file    Physically abused: Not on file    Forced sexual activity: Not on file  Other Topics Concern  . Not on file  Social History Narrative   Single.   Retired.    1 son, deceased.    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   Enjoys playing on the computer.        Family History  Problem Relation Age of Onset  . Alzheimer's disease Mother   . Osteoporosis Mother     No Known Allergies  Medication list reviewed and updated in full in [place].  GEN: No fevers, chills. Nontoxic. Primarily MSK c/o today. MSK: Detailed  in the HPI GI: tolerating PO intake without difficulty Neuro: No numbness, parasthesias, or tingling associated. Otherwise the pertinent positives of the ROS are noted above.   Objective:   BP (!) 86/61   Pulse 87   Temp 97.6 F (36.4 C) (Oral)   Ht '5\' 11"'  (1.803 m)   Wt 227 lb 4 oz (103.1 kg)   BMI 31.69 kg/m    GEN: WDWN, NAD, Non-toxic, Alert & Oriented x 3 HEENT: Atraumatic, Normocephalic.  Ears and Nose: No external deformity. EXTR: No clubbing/cyanosis/edema NEURO: Normal gait.  PSYCH: Normally interactive. Conversant. Not depressed or anxious appearing.  Calm demeanor.   Knee:  0-130 Gait: Normal heel toe pattern ROM: L Effusion: neg Echymosis or edema: none Patellar tendon NT Painful PLICA: neg Patellar grind: negative Medial and lateral patellar facet loading: negative medial and lateral joint lines:NT Mcmurray's neg Flexion-pinch neg Varus and valgus stress: stable Lachman: neg Ant and Post drawer: neg Hip abduction, IR, ER: WNL Hip flexion str: 5/5 Hip abd: 5/5 Quad: 5/5 VMO atrophy:No Hamstring concentric and eccentric: 5/5   Radiology:  Assessment and Plan:   Internal derangement of left knee  Hypotension due to drugs  Knee looks great  Concerning BP at 80/50. I am going to have him stop his Norvasc - this hopefully will help. Monitory home BP every few days.  He will let us know if remains low.  Signed,  Maud Deed. Devra Stare, MD   Allergies as of 12/10/2017   No Known Allergies     Medication List        Accurate as of 12/10/17  2:32 PM. Always use your most recent med list.          blood glucose meter kit and supplies Kit Dispense based on patient and insurance preference. Use up to four times daily as directed. (FOR ICD-9 250.00, 250.01).   buPROPion 150 MG 12 hr tablet Commonly known as:  ZYBAN TAKE 1 TABLET BY MOUTH TWICE A DAY   clopidogrel 75 MG tablet Commonly known as:  PLAVIX TAKE 1 TABLET BY MOUTH DAILY   escitalopram 10 MG tablet Commonly known as:  LEXAPRO TAKE 1 TABLET BY MOUTH EVERY DAY   fish oil-omega-3 fatty acids 1000 MG capsule Take 1 g by mouth daily.   fluticasone 50 MCG/ACT nasal spray Commonly known as:  FLONASE Place 1 spray into both nostrils daily as needed for allergies or rhinitis.   furosemide 20 MG tablet Commonly known as:  LASIX TAKE 1 TABLET BY MOUTH DAILY   gabapentin 300 MG capsule Commonly known as:  NEURONTIN TAKE 1 CAPSULE BY MOUTH THREE TIMES A DAY   glucose blood test strip USE TO TEST UP TO 4 TIMES DAILY   isosorbide mononitrate 30 MG 24 hr tablet Commonly known as:  IMDUR TAKE 1 TABLET BY MOUTH EVERY DAY   JARDIANCE 10 MG Tabs tablet Generic drug:  empagliflozin TAKE 1 TABLET (10 MG) BY MOUTH EVERY MORNING.   lidocaine 5 % Commonly known as:  LIDODERM Place 1 patch onto the skin every 12 (twelve) hours. Remove & Discard patch within 12 hours or as directed by MD   lisinopril 20 MG tablet Commonly known as:  PRINIVIL,ZESTRIL TAKE 1 TABLET BY MOUTH EVERY DAY   metFORMIN 500 MG tablet Commonly known as:  GLUCOPHAGE TAKE 1 TABLET BY MOUTH TWICE DAILY WITH A MEAL   metoprolol succinate 50 MG 24 hr tablet Commonly known as:  TOPROL-XL Take 1 tablet (50 mg total)  by mouth daily. Take with or immediately following a meal.   naproxen 500 MG tablet Commonly known as:   NAPROSYN Take 1 tablet (500 mg total) by mouth 2 (two) times daily with a meal.   nitroGLYCERIN 0.4 MG SL tablet Commonly known as:  NITROSTAT Place 1 tablet (0.4 mg total) under the tongue every 5 (five) minutes as needed for chest pain.   pantoprazole 40 MG tablet Commonly known as:  PROTONIX TAKE 1 TABLET BY MOUTH ONCE DAILY   promethazine 25 MG tablet Commonly known as:  PHENERGAN Take 1 tablet (25 mg total) by mouth every 8 (eight) hours as needed for nausea or vomiting.   rosuvastatin 20 MG tablet Commonly known as:  CRESTOR TAKE 1 TABLET BY MOUTH BEFORE BEDTIME   sitaGLIPtin 100 MG tablet Commonly known as:  JANUVIA Take 1 tablet (100 mg total) by mouth daily.   tamsulosin 0.4 MG Caps capsule Commonly known as:  FLOMAX Take 1 capsule (0.4 mg total) by mouth daily.   traMADol 50 MG tablet Commonly known as:  ULTRAM Take 1 tablet (50 mg total) by mouth every 6 (six) hours as needed for severe pain. Caution of sedation   warfarin 5 MG tablet Commonly known as:  COUMADIN Take as directed by the anticoagulation clinic. If you are unsure how to take this medication, talk to your nurse or doctor. Original instructions:  TAKE 1 TABLET BY MOUTH ON MONDAYS AND 1/2 TABLET ALL OTHER DAYS   warfarin 1 MG tablet Commonly known as:  COUMADIN Take as directed by the anticoagulation clinic. If you are unsure how to take this medication, talk to your nurse or doctor. Original instructions:  TAKE 1 TABLET ON MONDAYS. PT. USES 1 MG TABLETS IN ADDITION TO 5 MG TABLETS.

## 2017-12-10 ENCOUNTER — Ambulatory Visit (INDEPENDENT_AMBULATORY_CARE_PROVIDER_SITE_OTHER): Payer: Medicare Other | Admitting: Family Medicine

## 2017-12-10 ENCOUNTER — Encounter: Payer: Self-pay | Admitting: Family Medicine

## 2017-12-10 VITALS — BP 86/61 | HR 87 | Temp 97.6°F | Ht 71.0 in | Wt 227.2 lb

## 2017-12-10 DIAGNOSIS — M2392 Unspecified internal derangement of left knee: Secondary | ICD-10-CM | POA: Diagnosis not present

## 2017-12-10 DIAGNOSIS — I952 Hypotension due to drugs: Secondary | ICD-10-CM | POA: Diagnosis not present

## 2017-12-10 NOTE — Patient Instructions (Signed)
Stop your amlodipine

## 2017-12-11 NOTE — Telephone Encounter (Signed)
Called pt no answer. Unable to leave voicemail as it is not set up. Will send mychart message.

## 2017-12-12 ENCOUNTER — Other Ambulatory Visit: Payer: Self-pay | Admitting: Physician Assistant

## 2017-12-12 ENCOUNTER — Other Ambulatory Visit: Payer: Self-pay | Admitting: Primary Care

## 2017-12-12 DIAGNOSIS — E1142 Type 2 diabetes mellitus with diabetic polyneuropathy: Secondary | ICD-10-CM

## 2017-12-12 DIAGNOSIS — I10 Essential (primary) hypertension: Secondary | ICD-10-CM

## 2017-12-12 DIAGNOSIS — E785 Hyperlipidemia, unspecified: Secondary | ICD-10-CM

## 2017-12-12 MED ORDER — GABAPENTIN 300 MG PO CAPS: ORAL_CAPSULE | ORAL | 2 refills | Status: DC

## 2017-12-12 MED ORDER — ROSUVASTATIN CALCIUM 20 MG PO TABS: ORAL_TABLET | ORAL | 2 refills | Status: DC

## 2017-12-12 MED ORDER — METOPROLOL SUCCINATE ER 50 MG PO TB24: ORAL_TABLET | ORAL | 2 refills | Status: DC

## 2017-12-12 NOTE — Telephone Encounter (Signed)
Refills for metoprolol succinate, gabapentin, and rosuvastatin sent to pharmacy. Did not refill amlodipine as this was recently discontinued per Dr. Lorelei Pont for hypotension. Vallarie Mare, will you check on patient for BP readings? Thanks.

## 2017-12-12 NOTE — Telephone Encounter (Signed)
Medications have not been prescribed by Allie Bossier   Last office visit on 11/30/2017

## 2017-12-12 NOTE — Telephone Encounter (Signed)
Copied from [place] [id number]. Topic: Quick Communication - Rx Refill/Question >> Dec 12, 2017  1:58 PM Judyann Munson wrote: Medication: amlodipine 5 mg, gabapentin (NEURONTIN) 300 MG capsule, metoprolol succinate (TOPROL-XL) 50 MG 24 hr tablet, rosuvastatin (CRESTOR) 20 MG tablet   Has the patient contacted their pharmacy?yes   Preferred Pharmacy (with phone number or street name): [id number] [place], [address] [place] [address] Phone: [phone] Fax: [phone]    Agent: Please be advised that RX refills may take up to 3 business days. We ask that you follow-up with your pharmacy.

## 2017-12-13 ENCOUNTER — Ambulatory Visit (INDEPENDENT_AMBULATORY_CARE_PROVIDER_SITE_OTHER): Payer: Medicare Other | Admitting: General Practice

## 2017-12-13 DIAGNOSIS — I4819 Other persistent atrial fibrillation: Secondary | ICD-10-CM

## 2017-12-13 DIAGNOSIS — I481 Persistent atrial fibrillation: Secondary | ICD-10-CM | POA: Diagnosis not present

## 2017-12-13 LAB — POCT INR: INR: 2.7 (ref 2.0–3.0)

## 2017-12-13 NOTE — Patient Instructions (Addendum)
Pre visit review using our clinic review tool, if applicable. No additional management support is needed unless otherwise documented below in the visit note.  Continue to take 2.5 mg all days except 3.5 mg on Mondays.  Re-check in 6 weeks. Follow instructions about holding coumadin for procedure per cardiology.

## 2017-12-16 LAB — CUP PACEART REMOTE DEVICE CHECK
Battery Impedance: 252 Ohm
Battery Remaining Longevity: 119 mo
Battery Voltage: 2.8 V
Brady Statistic RV Percent Paced: 100 %
Date Time Interrogation Session: 20190724183104
Implantable Lead Implant Date: 20141010
Implantable Lead Implant Date: 20141010
Implantable Lead Location: 753859
Implantable Lead Location: 753860
Implantable Lead Model: 5076
Implantable Lead Model: 5076
Implantable Pulse Generator Implant Date: 20141010
Lead Channel Impedance Value: 67 Ohm
Lead Channel Impedance Value: 706 Ohm
Lead Channel Pacing Threshold Amplitude: 0.75 V
Lead Channel Pacing Threshold Pulse Width: 0.4 ms
Lead Channel Setting Pacing Amplitude: 2.5 V
Lead Channel Setting Pacing Pulse Width: 0.4 ms
Lead Channel Setting Sensing Sensitivity: 4 mV

## 2017-12-28 NOTE — Telephone Encounter (Signed)
Tried to call patient. Numerous times on 12/13/2017, 12/18/2017, 12/21/2017, 12/28/2017.  But could not leave message due to voicemail box is not set up.  Sending letter for an update.

## 2018-01-04 ENCOUNTER — Other Ambulatory Visit: Payer: Medicare Other | Admitting: Urology

## 2018-01-06 ENCOUNTER — Other Ambulatory Visit: Payer: Self-pay | Admitting: Physician Assistant

## 2018-01-10 ENCOUNTER — Other Ambulatory Visit: Payer: Self-pay | Admitting: Physician Assistant

## 2018-01-10 ENCOUNTER — Other Ambulatory Visit: Payer: Self-pay | Admitting: Primary Care

## 2018-01-12 ENCOUNTER — Other Ambulatory Visit: Payer: Self-pay | Admitting: Physician Assistant

## 2018-01-15 ENCOUNTER — Ambulatory Visit: Payer: Medicare Other | Admitting: Urology

## 2018-01-17 ENCOUNTER — Other Ambulatory Visit: Payer: Self-pay | Admitting: Physician Assistant

## 2018-01-21 ENCOUNTER — Telehealth: Payer: Self-pay | Admitting: Urology

## 2018-01-21 NOTE — Telephone Encounter (Signed)
Just FYI patient cx his biopsy and I tried to get him to reschd and he declined said he would call back later. Did not want to reschd today.   Sharyn Lull

## 2018-01-22 ENCOUNTER — Other Ambulatory Visit: Payer: Self-pay | Admitting: Primary Care

## 2018-01-22 ENCOUNTER — Other Ambulatory Visit: Payer: Medicare Other | Admitting: Urology

## 2018-01-22 DIAGNOSIS — E119 Type 2 diabetes mellitus without complications: Secondary | ICD-10-CM

## 2018-01-22 DIAGNOSIS — I200 Unstable angina: Secondary | ICD-10-CM

## 2018-01-23 ENCOUNTER — Telehealth: Payer: Self-pay | Admitting: Internal Medicine

## 2018-01-23 MED ORDER — NITROGLYCERIN 0.4 MG SL SUBL: 0.4000 mg | SUBLINGUAL_TABLET | SUBLINGUAL | 0 refills | Status: DC | PRN

## 2018-01-23 NOTE — Telephone Encounter (Signed)
Noted, refill sent to pharmacy. 

## 2018-01-23 NOTE — Telephone Encounter (Signed)
Patient declined to schedule visit and sleep studies at this time. Several attempted made by Suanne Marker.  Deleting Recall.

## 2018-01-23 NOTE — Telephone Encounter (Signed)
Rx have not been prescribed.  Last office visit on 11/30/2017

## 2018-01-31 ENCOUNTER — Ambulatory Visit (INDEPENDENT_AMBULATORY_CARE_PROVIDER_SITE_OTHER): Payer: Medicare Other | Admitting: General Practice

## 2018-01-31 DIAGNOSIS — I4819 Other persistent atrial fibrillation: Secondary | ICD-10-CM | POA: Diagnosis not present

## 2018-01-31 LAB — POCT INR: INR: 4 — AB (ref 2.0–3.0)

## 2018-01-31 NOTE — Patient Instructions (Addendum)
Pre visit review using our clinic review tool, if applicable. No additional management support is needed unless otherwise documented below in the visit note.  Hold coumadin tomorrow and Saturday (Patient has already taken today's dose) and then continue to take 2.5 mg all days except 3.5 mg on Mondays. Re-check in 3 weeks. Patient rescheduled prostate biopsy.

## 2018-02-01 ENCOUNTER — Ambulatory Visit (INDEPENDENT_AMBULATORY_CARE_PROVIDER_SITE_OTHER): Payer: Medicare Other | Admitting: Family Medicine

## 2018-02-01 ENCOUNTER — Encounter: Payer: Self-pay | Admitting: Family Medicine

## 2018-02-01 VITALS — BP 118/70 | HR 93 | Temp 97.8°F | Ht 71.0 in | Wt 228.5 lb

## 2018-02-01 DIAGNOSIS — J0110 Acute frontal sinusitis, unspecified: Secondary | ICD-10-CM

## 2018-02-01 DIAGNOSIS — J019 Acute sinusitis, unspecified: Secondary | ICD-10-CM | POA: Insufficient documentation

## 2018-02-01 MED ORDER — AMOXICILLIN-POT CLAVULANATE 875-125 MG PO TABS: 1.0000 | ORAL_TABLET | Freq: Two times a day (BID) | ORAL | 0 refills | Status: AC

## 2018-02-01 MED ORDER — GUAIFENESIN-CODEINE 100-10 MG/5ML PO SYRP: 5.0000 mL | ORAL_SOLUTION | Freq: Two times a day (BID) | ORAL | 0 refills | Status: DC | PRN

## 2018-02-01 NOTE — Assessment & Plan Note (Signed)
Anticipate viral given short duration.  Supportive care reviewed  - rec cheratussin for night, start flonase in am.  WASP for augmentin course provided today with indications when to fill - advised to wait through weekend to see if improvement with conservative measures alone.

## 2018-02-01 NOTE — Progress Notes (Signed)
BP 118/70 (BP Location: Left Arm, Patient Position: Sitting, Cuff Size: Large)   Pulse 93   Temp 97.8 F (36.6 C) (Oral)   Ht _0  (1.803 m)   Wt 228 lb 8 oz (103.6 kg)   SpO2 97%   BMI 31.87 kg/m    CC: sinus congestion "its in my head and chest" Subjective:    Patient ID: [name] Cook, male    DOB: [birth year], 67 y.o.   MRN: [id number]  HPI: [name] Cook is a 67 y.o. male presenting on 02/01/2018 for Sinus Problem (C/o sinus pressure, hoarseness and cough with yellow mucous. Started 01/26/18. Tried Mucinex, not helpfu;.)   6d h/o head > chest congestion associated with cough productive of yellow mucous. Sore throat, dyspnea. Hoarse voice for the last 2 days. Some sinus pressure between eyes.   No fevers/chills, ear or tooth pain, PNdrainage, headaches, wheezing.  Tried mucinex without improvement  Grandchildren sick recently.  Ex smoker - quit 6 yrs ago.  No h/o asthma.   Relevant past medical, surgical, family and social history reviewed and updated as indicated. Interim medical history since our last visit reviewed. Allergies and medications reviewed and updated. Outpatient Medications Prior to Visit  Medication Sig Dispense Refill  . blood glucose meter kit and supplies KIT Dispense based on patient and insurance preference. Use up to four times daily as directed. (FOR ICD-9 250.00, 250.01). 1 each 0  . buPROPion (ZYBAN) 150 MG 12 hr tablet TAKE 1 TABLET BY MOUTH TWICE A DAY 180 tablet 1  . clopidogrel (PLAVIX) 75 MG tablet TAKE 1 TABLET BY MOUTH DAILY 90 tablet 3  . escitalopram (LEXAPRO) 10 MG tablet TAKE 1 TABLET BY MOUTH EVERY DAY 90 tablet 1  . fish oil-omega-3 fatty acids 1000 MG capsule Take 1 g by mouth daily.      . fluticasone (FLONASE) 50 MCG/ACT nasal spray Place 1 spray into both nostrils daily as needed for allergies or rhinitis.    . furosemide (LASIX) 20 MG tablet TAKE 1 TABLET BY MOUTH EVERY DAY 90 tablet 2  . gabapentin (NEURONTIN) 300 MG capsule Take 1  capsule by mouth three times daily for diabetic neuropathy. 270 capsule 2  . glucose blood (ONE TOUCH ULTRA TEST) test strip USE TO TEST UP TO 4 TIMES DAILY 100 each 2  . isosorbide mononitrate (IMDUR) 30 MG 24 hr tablet TAKE 1 TABLET BY MOUTH EVERY DAY 90 tablet 1  . JARDIANCE 10 MG TABS tablet TAKE 1 TABLET (10 MG) BY MOUTH EVERY MORNING. 90 tablet 1  . lidocaine (LIDODERM) 5 % Place 1 patch onto the skin every 12 (twelve) hours. Remove & Discard patch within 12 hours or as directed by MD 15 patch 0  . lisinopril (PRINIVIL,ZESTRIL) 20 MG tablet TAKE 1 TABLET BY MOUTH EVERY DAY 90 tablet 1  . metFORMIN (GLUCOPHAGE) 500 MG tablet TAKE 1 TABLET BY MOUTH TWICE DAILY WITH A MEAL 180 tablet 1  . metoprolol succinate (TOPROL-XL) 50 MG 24 hr tablet Take 1 tablet by mouth once daily for blood pressure and heart rate.Take with or immediately following a meal. 90 tablet 2  . naproxen (NAPROSYN) 500 MG tablet Take 1 tablet (500 mg total) by mouth 2 (two) times daily with a meal. 20 tablet 0  . nitroGLYCERIN (NITROSTAT) 0.4 MG SL tablet Place 1 tablet (0.4 mg total) under the tongue every 5 (five) minutes as needed for chest pain. 25 tablet 0  . pantoprazole (PROTONIX) 40 MG  tablet TAKE 1 TABLET BY MOUTH ONCE DAILY 90 tablet 3  . promethazine (PHENERGAN) 25 MG tablet Take 1 tablet (25 mg total) by mouth every 8 (eight) hours as needed for nausea or vomiting. 20 tablet 0  . rosuvastatin (CRESTOR) 20 MG tablet Take 1 tablet by mouth every evening for cholesterol. 90 tablet 2  . sitaGLIPtin (JANUVIA) 100 MG tablet Take 1 tablet by mouth once daily for diabetes. 90 tablet 2  . tamsulosin (FLOMAX) 0.4 MG CAPS capsule Take 1 capsule (0.4 mg total) by mouth daily. 30 capsule 11  . traMADol (ULTRAM) 50 MG tablet Take 1 tablet (50 mg total) by mouth every 6 (six) hours as needed for severe pain. Caution of sedation 20 tablet 0  . warfarin (COUMADIN) 1 MG tablet TAKE 1 TABLET ON MONDAYS. PT. USES 1 MG TABLETS IN ADDITION  TO 5 MG TABLETS. 90 tablet 0  . warfarin (COUMADIN) 5 MG tablet TAKE 1 TABLET BY MOUTH ON MONDAYS AND 1/2 TABLET ALL OTHER DAYS 90 tablet 3   No facility-administered medications prior to visit.      Per HPI unless specifically indicated in ROS section below Review of Systems     Objective:    BP 118/70 (BP Location: Left Arm, Patient Position: Sitting, Cuff Size: Large)   Pulse 93   Temp 97.8 F (36.6 C) (Oral)   Ht _0  (1.803 m)   Wt 228 lb 8 oz (103.6 kg)   SpO2 97%   BMI 31.87 kg/m   Wt Readings from Last 3 Encounters:  02/01/18 228 lb 8 oz (103.6 kg)  12/10/17 227 lb 4 oz (103.1 kg)  11/30/17 229 lb (103.9 kg)    Physical Exam  Constitutional: He appears well-developed and well-nourished. No distress.  HENT:  Head: Normocephalic and atraumatic.  Right Ear: Hearing, tympanic membrane, external ear and ear canal normal.  Left Ear: Hearing, tympanic membrane, external ear and ear canal normal.  Nose: Mucosal edema (R>L nasal mucosal congestion/erythema) present. No rhinorrhea. Right sinus exhibits no maxillary sinus tenderness and no frontal sinus tenderness. Left sinus exhibits no maxillary sinus tenderness and no frontal sinus tenderness.  Mouth/Throat: Uvula is midline, oropharynx is clear and moist and mucous membranes are normal. No oropharyngeal exudate, posterior oropharyngeal edema, posterior oropharyngeal erythema (mild) or tonsillar abscesses.  Eyes: Pupils are equal, round, and reactive to light. Conjunctivae and EOM are normal. No scleral icterus.  Neck: Normal range of motion. Neck supple.  Cardiovascular: Normal rate, regular rhythm, normal heart sounds and intact distal pulses.  No murmur heard. Pulmonary/Chest: Effort normal and breath sounds normal. No respiratory distress. He has no wheezes. He has no rales.  Lymphadenopathy:    He has no cervical adenopathy.  Skin: Skin is warm and dry. No rash noted.  Nursing note and vitals reviewed.  Results for  orders placed or performed in visit on 01/31/18  POCT INR  Result Value Ref Range   INR 4.0 (A) 2.0 - 3.0   Lab Results  Component Value Date   CREATININE 1.16 11/28/2017   BUN 21 11/28/2017   NA 139 11/28/2017   K 4.2 11/28/2017   CL 105 11/28/2017   CO2 26 11/28/2017    Lab Results  Component Value Date   HGBA1C 6.7 (H) 09/21/2017       Assessment & Plan:   Problem List Items Addressed This Visit    Acute sinusitis - Primary    Anticipate viral given short duration.  Supportive care  reviewed  - rec cheratussin for night, start flonase in am.  WASP for augmentin course provided today with indications when to fill - advised to wait through weekend to see if improvement with conservative measures alone.       Relevant Medications   guaiFENesin-codeine (CHERATUSSIN AC) 100-10 MG/5ML syrup   amoxicillin-clavulanate (AUGMENTIN) 875-125 MG tablet       Meds ordered this encounter  Medications  . guaiFENesin-codeine (CHERATUSSIN AC) 100-10 MG/5ML syrup    Sig: Take 5 mLs by mouth 2 (two) times daily as needed for cough (sedation precautions).    Dispense:  120 mL    Refill:  0  . amoxicillin-clavulanate (AUGMENTIN) 875-125 MG tablet    Sig: Take 1 tablet by mouth 2 (two) times daily for 10 days.    Dispense:  20 tablet    Refill:  0   No orders of the defined types were placed in this encounter.   Follow up plan: Return if symptoms worsen or fail to improve.  Ria Bush, MD

## 2018-02-01 NOTE — Patient Instructions (Signed)
You have a sinus infection, likely viral. Take medicine as prescribed: flonase nasal steroid and cheratussin codeine cough syrup for night time.  Push fluids and plenty of rest.  Nasal saline irrigation or neti pot to help drain sinuses.  Start taking flonase nasal steroid to help with sinus inflammation.  May use plain mucinex with plenty of fluid to help mobilize mucous. Please let us know if fever >101.5, trouble opening/closing mouth, difficulty swallowing, or worsening instead of improving as expected.  Call me on Monday if no improvement for antibiotic course.

## 2018-02-04 NOTE — Telephone Encounter (Addendum)
Please send the patient a letter outlining the following:  You were scheduled to have a prostate biopsy.  This is due to an elevated and rising PSA as well as abnormal rectal exam which was highly suspicious for prostate cancer.  I strongly recommend that you reschedule your prostate biopsy or return to our clinic to discuss this further.    I will also cc: his primary care this note.  Hollice Espy, MD

## 2018-02-04 NOTE — Telephone Encounter (Signed)
Noted, appreciate the information and will try to connect with patient via My Chart.

## 2018-02-05 ENCOUNTER — Ambulatory Visit: Payer: Medicare Other | Admitting: Urology

## 2018-02-06 ENCOUNTER — Ambulatory Visit (INDEPENDENT_AMBULATORY_CARE_PROVIDER_SITE_OTHER): Payer: Medicare Other | Admitting: *Deleted

## 2018-02-06 ENCOUNTER — Telehealth: Payer: Self-pay

## 2018-02-06 DIAGNOSIS — I495 Sick sinus syndrome: Secondary | ICD-10-CM | POA: Diagnosis not present

## 2018-02-06 NOTE — Telephone Encounter (Signed)
Spoke with pt and reminded pt of remote transmission that is due today. Pt verbalized understanding.   

## 2018-02-06 NOTE — Progress Notes (Signed)
Remote pacemaker transmission.   

## 2018-02-07 ENCOUNTER — Other Ambulatory Visit: Payer: Self-pay | Admitting: Physician Assistant

## 2018-02-18 ENCOUNTER — Other Ambulatory Visit: Payer: Self-pay | Admitting: Primary Care

## 2018-02-18 DIAGNOSIS — E119 Type 2 diabetes mellitus without complications: Secondary | ICD-10-CM

## 2018-02-18 NOTE — Telephone Encounter (Signed)
Rx have not been prescribed by Allie Bossier yet.  Last office visit on 02/01/2018 with Dr Danise Mina for acute. Next Follow appt with Anda Kraft on 04/02/2018

## 2018-02-18 NOTE — Telephone Encounter (Signed)
Noted, refill sent to pharmacy. 

## 2018-02-20 ENCOUNTER — Telehealth: Payer: Self-pay | Admitting: *Deleted

## 2018-02-20 NOTE — Telephone Encounter (Signed)
Spoke to pt who states he was seen on 10/18 and completed abx. He states that he is still experiencing cough and nasal congestion and is wanting to know how to proceed. Pt denies any SHoB or any other s/s. pls advise

## 2018-02-20 NOTE — Telephone Encounter (Signed)
How are symptoms overall? Any improvement at all? Did he feel any better while on antibiotic? Any fever or facial pressure/pain? Was codeine cough syrup effective? Could refill cough syrup if desired.

## 2018-02-21 ENCOUNTER — Ambulatory Visit (INDEPENDENT_AMBULATORY_CARE_PROVIDER_SITE_OTHER): Payer: Medicare Other | Admitting: General Practice

## 2018-02-21 DIAGNOSIS — I4819 Other persistent atrial fibrillation: Secondary | ICD-10-CM | POA: Diagnosis not present

## 2018-02-21 LAB — POCT INR: INR: 2.9 (ref 2.0–3.0)

## 2018-02-21 NOTE — Telephone Encounter (Signed)
Spoke with pt to get an update.  States he is feeling better overall and after the abx.  Says the cough syrup helps.

## 2018-02-21 NOTE — Telephone Encounter (Signed)
Spoke to pt; who indicates he is wanting a refill of cough syrup, but states he will allow more recovery time as well

## 2018-02-21 NOTE — Telephone Encounter (Signed)
Would he like refill of cough syrup?  If overall better would recommend give recovery more time.

## 2018-02-21 NOTE — Patient Instructions (Addendum)
Pre visit review using our clinic review tool, if applicable. No additional management support is needed unless otherwise documented below in the visit note. Continue to take 2.5 mg all days except 3.5 mg on Mondays.  Re-check in 4 weeks.

## 2018-02-21 NOTE — Telephone Encounter (Signed)
Attempted to contact pt. No answer. Vm has not been set up yet.  Need to relay Dr. Synthia Innocent message. [See 1:37 PM timestamp.]

## 2018-02-22 MED ORDER — GUAIFENESIN-CODEINE 100-10 MG/5ML PO SYRP: 5.0000 mL | ORAL_SOLUTION | Freq: Two times a day (BID) | ORAL | 0 refills | Status: DC | PRN

## 2018-02-22 NOTE — Telephone Encounter (Signed)
cough syrup refilled

## 2018-02-28 LAB — CUP PACEART REMOTE DEVICE CHECK
Battery Impedance: 253 Ohm
Battery Remaining Longevity: 119 mo
Battery Voltage: 2.8 V
Brady Statistic RV Percent Paced: 100 %
Date Time Interrogation Session: 20191023141225
Implantable Lead Implant Date: 20141010
Implantable Lead Implant Date: 20141010
Implantable Lead Location: 753859
Implantable Lead Location: 753860
Implantable Lead Model: 5076
Implantable Lead Model: 5076
Implantable Pulse Generator Implant Date: 20141010
Lead Channel Impedance Value: 67 Ohm
Lead Channel Impedance Value: 706 Ohm
Lead Channel Pacing Threshold Amplitude: 0.75 V
Lead Channel Pacing Threshold Pulse Width: 0.4 ms
Lead Channel Setting Pacing Amplitude: 2.5 V
Lead Channel Setting Pacing Pulse Width: 0.4 ms
Lead Channel Setting Sensing Sensitivity: 4 mV

## 2018-03-03 ENCOUNTER — Other Ambulatory Visit: Payer: Self-pay | Admitting: Physician Assistant

## 2018-03-19 ENCOUNTER — Other Ambulatory Visit: Payer: Self-pay | Admitting: *Deleted

## 2018-03-19 MED ORDER — FLUTICASONE PROPIONATE 50 MCG/ACT NA SUSP: 1.0000 | Freq: Every day | NASAL | 3 refills | Status: DC | PRN

## 2018-03-19 MED ORDER — PROMETHAZINE HCL 25 MG PO TABS: 25.0000 mg | ORAL_TABLET | Freq: Three times a day (TID) | ORAL | 0 refills | Status: DC | PRN

## 2018-03-21 ENCOUNTER — Ambulatory Visit: Payer: Medicare Other

## 2018-03-25 ENCOUNTER — Encounter: Payer: Self-pay | Admitting: Internal Medicine

## 2018-03-25 ENCOUNTER — Ambulatory Visit (INDEPENDENT_AMBULATORY_CARE_PROVIDER_SITE_OTHER): Payer: Medicare Other | Admitting: Internal Medicine

## 2018-03-25 VITALS — BP 114/76 | HR 95 | Temp 97.9°F | Wt 228.0 lb

## 2018-03-25 DIAGNOSIS — J441 Chronic obstructive pulmonary disease with (acute) exacerbation: Secondary | ICD-10-CM

## 2018-03-25 MED ORDER — AMOXICILLIN-POT CLAVULANATE 875-125 MG PO TABS: 1.0000 | ORAL_TABLET | Freq: Two times a day (BID) | ORAL | 0 refills | Status: DC

## 2018-03-25 NOTE — Patient Instructions (Signed)
Chronic Obstructive Pulmonary Disease Exacerbation  Chronic obstructive pulmonary disease (COPD) is a common lung problem. In COPD, the flow of air from the lungs is limited. COPD exacerbations are times that breathing gets worse and you need extra treatment. Without treatment they can be life threatening. If they happen often, your lungs can become more damaged. If your COPD gets worse, your doctor may treat you with:  ? Medicines.  ? Oxygen.  ? Different ways to clear your airway, such as using a mask.    Follow these instructions at home:  ? Do not smoke.  ? Avoid tobacco smoke and other things that bother your lungs.  ? If given, take your antibiotic medicine as told. Finish the medicine even if you start to feel better.  ? Only take medicines as told by your doctor.  ? Drink enough fluids to keep your pee (urine) clear or pale yellow (unless your doctor has told you not to).  ? Use a cool mist machine (vaporizer).  ? If you use oxygen or a machine that turns liquid medicine into a mist (nebulizer), continue to use them as told.  ? Keep up with shots (vaccinations) as told by your doctor.  ? Exercise regularly.  ? Eat healthy foods.  ? Keep all doctor visits as told.  Get help right away if:  ? You are very short of breath and it gets worse.  ? You have trouble talking.  ? You have bad chest pain.  ? You have blood in your spit (sputum).  ? You have a fever.  ? You keep throwing up (vomiting).  ? You feel weak, or you pass out (faint).  ? You feel confused.  ? You keep getting worse.  This information is not intended to replace advice given to you by your health care provider. Make sure you discuss any questions you have with your health care provider.  Document Released: 03/23/2011 Document Revised: 09/09/2015 Document Reviewed: 12/06/2012  Elsevier Interactive Patient Education ? 2017 [employer]

## 2018-03-25 NOTE — Progress Notes (Signed)
HPI  Pt presents to the clinic today with c/o cough. This started 1 week ago. The cough is productive of yellow mucous. He denies runny nose, nasal congestion, ear pain, sore throat or shortness of breath. He denies fever, chills or body aches. He has tried Dayquil with some relief. He has not had sick contacts that he is aware of. He has a history of DM 2 and COPD.   Review of Systems      Past Medical History:  Diagnosis Date  . Arthritis    "knees and lower back" (03/14/2013)  . Atrial flutter ([place])    radiofrequency ablation in 2001  . CAD (coronary artery disease)    a. Nonobstructive. Cardiac cath in 2001-50% mid RI, normal LM, LAD, RCA b. cath 10/16/2014 95% mid RCA treated with DES, 99% ost D1 medical management due to small aneurysmal segment  . Chronic anticoagulation    chronic Coumadin anticoagulation  . Chronic obstructive pulmonary disease ([place]) 04/20/2011  . Diabetes mellitus, type 2 ([place])   . GERD (gastroesophageal reflux disease)   . Hyperlipidemia   . Hypertension    with hypertensive heart disease  . Obesity   . Persistent atrial fibrillation    recurrent atrial flutter since 2001 s/p DCCVs, multiple failed AADs, h/o tachy-mediated cardiomyopathy  . Shortness of breath    "can come on at any time" (03/14/2013)  . Sleep apnea    "dx'd; couldn't wear the mask" (03/14/2013)  . Tobacco abuse     Family History  Problem Relation Age of Onset  . Alzheimer's disease Mother   . Osteoporosis Mother     Social History   Socioeconomic History  . Marital status: Legally Separated    Spouse name: Not on file  . Number of children: 1  . Years of education: Not on file  . Highest education level: Not on file  Occupational History  . Occupation: Merchandiser, retail: UNEMPLOYED  Social Needs  . Financial resource strain: Not on file  . Food insecurity:    Worry: Not on file    Inability: Not on file  . Transportation needs:    Medical: Not on file   Non-medical: Not on file  Tobacco Use  . Smoking status: Former Smoker    Packs/day: 1.00    Years: 42.00    Pack years: 42.00    Types: Cigarettes    Last attempt to quit: 12/31/2011    Years since quitting: 6.2  . Smokeless tobacco: Never Used  Substance and Sexual Activity  . Alcohol use: Not Currently    Comment: 03/14/2013 "stopped drinking back in 2002; never had problem w/it"  . Drug use: No  . Sexual activity: Not Currently  Lifestyle  . Physical activity:    Days per week: Not on file    Minutes per session: Not on file  . Stress: Not on file  Relationships  . Social connections:    Talks on phone: Not on file    Gets together: Not on file    Attends religious service: Not on file    Active member of club or organization: Not on file    Attends meetings of clubs or organizations: Not on file    Relationship status: Not on file  . Intimate partner violence:    Fear of current or ex partner: Not on file    Emotionally abused: Not on file    Physically abused: Not on file    Forced sexual activity:  Not on file  Other Topics Concern  . Not on file  Social History Narrative   Single.   Retired.    1 son, deceased.    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   Enjoys playing on the computer.        No Known Allergies   Constitutional: Denies headache, fatigue, fever or abrupt weight changes.  HEENT:   Denies eye redness, eye pain, pressure behind the eyes, facial pain, nasal congestion, ear pain, ringing in the ears, wax buildup, runny nose or sore throat. Respiratory: Positive cough. Denies difficulty breathing or shortness of breath.  Cardiovascular: Denies chest pain, chest tightness, palpitations or swelling in the hands or feet.   No other specific complaints in a complete review of systems (except as listed in HPI above).  Objective:   BP 114/76   Pulse 95   Temp 97.9 F (36.6 C) (Oral)   Wt 228 lb (103.4 kg)    SpO2 96%   BMI 31.80 kg/m  Wt Readings from Last 3 Encounters:  03/25/18 228 lb (103.4 kg)  02/01/18 228 lb 8 oz (103.6 kg)  12/10/17 227 lb 4 oz (103.1 kg)     General: Appears his stated age, obese, in NAD. HEENT: Head: normal shape and size, no sinus tenderness noted;Nose: mucosa pink and moist, septum midline; Throat/Mouth: + PND. Teeth present, mucosa pink and moist, no exudate noted, no lesions or ulcerations noted.  Neck: No cervical lymphadenopathy.  Cardiovascular: Normal rate and rhythm. Pulmonary/Chest: Normal effort and positive vesicular breath sounds with rhonchi noted in RLL. No respiratory distress. No wheezes, rales noted.       Assessment & Plan:   COPD Exacerbation:  Get some rest and drink plenty of water eRx for Augmentin BID x 7 days Can take Mucinex 600 mg BID x 3 days Delsym as needed for cough No indication for steroids at this time  RTC as needed or if symptoms persist.   Webb Silversmith, NP

## 2018-03-27 ENCOUNTER — Other Ambulatory Visit: Payer: Self-pay | Admitting: Family Medicine

## 2018-04-02 ENCOUNTER — Ambulatory Visit (INDEPENDENT_AMBULATORY_CARE_PROVIDER_SITE_OTHER): Payer: Medicare Other | Admitting: Primary Care

## 2018-04-02 ENCOUNTER — Ambulatory Visit (INDEPENDENT_AMBULATORY_CARE_PROVIDER_SITE_OTHER): Payer: Medicare Other

## 2018-04-02 VITALS — BP 118/70 | HR 88 | Temp 98.0°F | Ht 71.0 in | Wt 224.5 lb

## 2018-04-02 DIAGNOSIS — E119 Type 2 diabetes mellitus without complications: Secondary | ICD-10-CM | POA: Diagnosis not present

## 2018-04-02 DIAGNOSIS — I4819 Other persistent atrial fibrillation: Secondary | ICD-10-CM

## 2018-04-02 LAB — POCT INR: INR: 3.1 — AB (ref 2.0–3.0)

## 2018-04-02 LAB — POCT GLYCOSYLATED HEMOGLOBIN (HGB A1C): Hemoglobin A1C: 6.4 % — AB (ref 4.0–5.6)

## 2018-04-02 NOTE — Progress Notes (Signed)
Subjective:    Patient ID: [name] Cook, male    DOB: [birth year], 67 y.o.   MRN: [id number]  HPI  Mr. Barsky is a 67 year old male who presents today for follow up of type 2 diabetes. He is also needing his INR checked.  Current medications include: Jardiance 10 mg, metformin 500 mg BID, sitagliptin 100 mg daily.  He is checking his blood glucose 2-3 times daily and is getting readings of: AM fasting: 110's-120's.   Last A1C: 6.7 in June 2019 Last Eye Exam: Due in May 2020 Last Foot Exam: Due today Pneumonia Vaccination: Completed in 2019 ACE/ARB: lisinopril  Statin: Crestor  Diet currently consists of:  Breakfast: Skips Lunch: Sandwich Dinner: Soup, take out food, some meat, little vegetables  Snacks: Cookies, candy Desserts: Daily  Beverages: Flavored water, coffee, occasionally soda  Exercise: He is not exercising   BP Readings from Last 3 Encounters:  04/02/18 118/70  03/25/18 114/76  02/01/18 118/70    Review of Systems  Respiratory: Negative for shortness of breath.   Cardiovascular: Negative for chest pain.  Neurological: Negative for dizziness.       Chronic numbness to plantar feet       Past Medical History:  Diagnosis Date  . Arthritis    "knees and lower back" (03/14/2013)  . Atrial flutter ([facility])    radiofrequency ablation in 2001  . CAD (coronary artery disease)    a. Nonobstructive. Cardiac cath in 2001-50% mid RI, normal LM, LAD, RCA b. cath 10/16/2014 95% mid RCA treated with DES, 99% ost D1 medical management due to small aneurysmal segment  . Chronic anticoagulation    chronic Coumadin anticoagulation  . Chronic obstructive pulmonary disease ([place] [facility]) 04/20/2011  . Diabetes mellitus, type 2 ([place])   . GERD (gastroesophageal reflux disease)   . Hyperlipidemia   . Hypertension    with hypertensive heart disease  . Obesity   . Persistent atrial fibrillation    recurrent atrial flutter since 2001 s/p DCCVs, multiple failed AADs, h/o tachy-mediated  cardiomyopathy  . Shortness of breath    "can come on at any time" (03/14/2013)  . Sleep apnea    "dx'd; couldn't wear the mask" (03/14/2013)  . Tobacco abuse      Social History   Socioeconomic History  . Marital status: Legally Separated    Spouse name: Not on file  . Number of children: 1  . Years of education: Not on file  . Highest education level: Not on file  Occupational History  . Occupation: Merchandiser, retail: UNEMPLOYED  Social Needs  . Financial resource strain: Not on file  . Food insecurity:    Worry: Not on file    Inability: Not on file  . Transportation needs:    Medical: Not on file    Non-medical: Not on file  Tobacco Use  . Smoking status: Former Smoker    Packs/day: 1.00    Years: 42.00    Pack years: 42.00    Types: Cigarettes    Last attempt to quit: 12/31/2011    Years since quitting: 6.2  . Smokeless tobacco: Never Used  Substance and Sexual Activity  . Alcohol use: Not Currently    Comment: 03/14/2013 "stopped drinking back in 2002; never had problem w/it"  . Drug use: No  . Sexual activity: Not Currently  Lifestyle  . Physical activity:    Days per week: Not on file    Minutes per session: Not on  file  . Stress: Not on file  Relationships  . Social connections:    Talks on phone: Not on file    Gets together: Not on file    Attends religious service: Not on file    Active member of club or organization: Not on file    Attends meetings of clubs or organizations: Not on file    Relationship status: Not on file  . Intimate partner violence:    Fear of current or ex partner: Not on file    Emotionally abused: Not on file    Physically abused: Not on file    Forced sexual activity: Not on file  Other Topics Concern  . Not on file  Social History Narrative   Single.   Retired.    1 son, deceased.    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   Enjoys playing on the computer.         Past Surgical History:  Procedure Laterality Date  . ATRIAL FLUTTER ABLATION  2002   atrial flutter; subsequently developed atrial fibrillation  . AV NODE ABLATION  01/24/2013  . CARDIAC CATHETERIZATION  2002  . CARDIAC CATHETERIZATION N/A 10/16/2014   Procedure: Left Heart Cath and Coronary Angiography;  Surgeon: [name], MD;  Location: [facility] [facility];  Service: Cardiovascular;  Laterality: N/A;  . CARDIOVERSION  05/31/2011   Procedure: CARDIOVERSION;  Surgeon: Cristopher Estimable. Lattie Haw, MD;  Location: [facility];  Service: Cardiovascular;  Laterality: N/A;  . CARPAL TUNNEL RELEASE Left 1980's  . INSERT / REPLACE / REMOVE PACEMAKER  01/24/2013    Medtronic Adapta L dual-chamber pacemaker, serial number [id number]   . LEFT HEART CATHETERIZATION WITH CORONARY ANGIOGRAM N/A 03/17/2013   Procedure: LEFT HEART CATHETERIZATION WITH CORONARY ANGIOGRAM;  Surgeon: Burnell Blanks, MD; LAD mild dz, D1 branch 100%, inferior branch 99%, CFX OK, RCA 50%, EF 65%    . LOOP RECORDER IMPLANT  2002  . PERMANENT PACEMAKER INSERTION N/A 01/24/2013   Procedure: PERMANENT PACEMAKER INSERTION;  Surgeon: [name], MD;  Location: [facility];  Service: Cardiovascular;  Laterality: N/A;  . TIBIAL TUBERCLERPLASTY  ~ 2003    Family History  Problem Relation Age of Onset  . Alzheimer's disease Mother   . Osteoporosis Mother     No Known Allergies  Current Outpatient Medications on File Prior to Visit  Medication Sig Dispense Refill  . blood glucose meter kit and supplies KIT Dispense based on patient and insurance preference. Use up to four times daily as directed. (FOR ICD-9 250.00, 250.01). 1 each 0  . buPROPion (ZYBAN) 150 MG 12 hr tablet TAKE 1 TABLET BY MOUTH TWICE A DAY 180 tablet 1  . clopidogrel (PLAVIX) 75 MG tablet TAKE 1 TABLET BY MOUTH DAILY 90 tablet 3  . empagliflozin (JARDIANCE) 10 MG TABS tablet Take 1 tablet by mouth every morning for diabetes. 90 tablet 0  . escitalopram  (LEXAPRO) 10 MG tablet TAKE 1 TABLET BY MOUTH EVERY DAY 90 tablet 1  . fish oil-omega-3 fatty acids 1000 MG capsule Take 1 g by mouth daily.      . fluticasone (FLONASE) 50 MCG/ACT nasal spray Place 1 spray into both nostrils daily as needed for allergies or rhinitis. 48 g 3  . furosemide (LASIX) 20 MG tablet TAKE 1 TABLET BY MOUTH EVERY DAY 90 tablet 2  . gabapentin (NEURONTIN) 300 MG capsule Take 1 capsule by mouth three times daily for diabetic neuropathy.  270 capsule 2  . glucose blood (ONE TOUCH ULTRA TEST) test strip USE TO TEST UP TO 4 TIMES DAILY 100 each 2  . guaiFENesin-codeine (CHERATUSSIN AC) 100-10 MG/5ML syrup Take 5 mLs by mouth 2 (two) times daily as needed for cough (sedation precautions). 120 mL 0  . isosorbide mononitrate (IMDUR) 30 MG 24 hr tablet TAKE 1 TABLET BY MOUTH EVERY DAY 90 tablet 1  . lidocaine (LIDODERM) 5 % Place 1 patch onto the skin every 12 (twelve) hours. Remove & Discard patch within 12 hours or as directed by MD 15 patch 0  . lisinopril (PRINIVIL,ZESTRIL) 20 MG tablet TAKE 1 TABLET BY MOUTH EVERY DAY 90 tablet 1  . metFORMIN (GLUCOPHAGE) 500 MG tablet TAKE 1 TABLET BY MOUTH TWICE DAILY WITH A MEAL 180 tablet 1  . metoprolol succinate (TOPROL-XL) 50 MG 24 hr tablet Take 1 tablet by mouth once daily for blood pressure and heart rate.Take with or immediately following a meal. 90 tablet 2  . nitroGLYCERIN (NITROSTAT) 0.4 MG SL tablet Place 1 tablet (0.4 mg total) under the tongue every 5 (five) minutes as needed for chest pain. 25 tablet 0  . pantoprazole (PROTONIX) 40 MG tablet TAKE 1 TABLET BY MOUTH ONCE DAILY 90 tablet 3  . promethazine (PHENERGAN) 25 MG tablet Take 1 tablet (25 mg total) by mouth every 8 (eight) hours as needed for nausea or vomiting. 20 tablet 0  . rosuvastatin (CRESTOR) 20 MG tablet Take 1 tablet by mouth every evening for cholesterol. 90 tablet 2  . sitaGLIPtin (JANUVIA) 100 MG tablet Take 1 tablet by mouth once daily for diabetes. 90 tablet 2   . tamsulosin (FLOMAX) 0.4 MG CAPS capsule Take 1 capsule (0.4 mg total) by mouth daily. 30 capsule 11  . traMADol (ULTRAM) 50 MG tablet Take 1 tablet (50 mg total) by mouth every 6 (six) hours as needed for severe pain. Caution of sedation 20 tablet 0  . warfarin (COUMADIN) 1 MG tablet TAKE 1 TABLET ON MONDAYS. PT. USES 1 MG TABLETS IN ADDITION TO 5 MG TABLETS. 90 tablet 0  . warfarin (COUMADIN) 5 MG tablet TAKE 1 TABLET BY MOUTH ON MONDAYS AND 1/2 TABLET ALL OTHER DAYS 48 tablet 7   No current facility-administered medications on file prior to visit.     BP 118/70   Pulse 88   Temp 98 F (36.7 C) (Oral)   Ht 5' 11" (1.803 m)   Wt 224 lb 8 oz (101.8 kg)   SpO2 96%   BMI 31.31 kg/m    Objective:   Physical Exam  Constitutional: He appears well-nourished.  Neck: Neck supple.  Cardiovascular: Normal rate and regular rhythm.  Respiratory: Effort normal and breath sounds normal.  Skin: Skin is warm and dry.  Dry skin to feet. Callous noted to left plantar ball of foot.  Psychiatric: He has a normal mood and affect.           Assessment & Plan:

## 2018-04-02 NOTE — Patient Instructions (Signed)
Stop by the lab prior to leaving today. I will notify you of your results once received.   It is important that you improve your diet. Please limit carbohydrates in the form of white bread, rice, pasta, sweets, fast food, fried food, sugary drinks, etc. Increase your consumption of fresh fruits and vegetables, whole grains, lean protein.  Ensure you are consuming 64 ounces of water daily.  Start exercising. You should be getting 150 minutes of exercise weekly.  Ask for [facility] up front for Coumadin check.  We will see you in June 2020 as scheduled.  It was a pleasure to see you today!   Diabetes Mellitus and Nutrition When you have diabetes (diabetes mellitus), it is very important to have healthy eating habits because your blood sugar (glucose) levels are greatly affected by what you eat and drink. Eating healthy foods in the appropriate amounts, at about the same times every day, can help you:  Control your blood glucose.  Lower your risk of heart disease.  Improve your blood pressure.  Reach or maintain a healthy weight.  Every person with diabetes is different, and each person has different needs for a meal plan. Your health care provider may recommend that you work with a diet and nutrition specialist (dietitian) to make a meal plan that is best for you. Your meal plan may vary depending on factors such as:  The calories you need.  The medicines you take.  Your weight.  Your blood glucose, blood pressure, and cholesterol levels.  Your activity level.  Other health conditions you have, such as heart or kidney disease.  How do carbohydrates affect me? Carbohydrates affect your blood glucose level more than any other type of food. Eating carbohydrates naturally increases the amount of glucose in your blood. Carbohydrate counting is a method for keeping track of how many carbohydrates you eat. Counting carbohydrates is important to keep your blood glucose at a healthy level,  especially if you use insulin or take certain oral diabetes medicines. It is important to know how many carbohydrates you can safely have in each meal. This is different for every person. Your dietitian can help you calculate how many carbohydrates you should have at each meal and for snack. Foods that contain carbohydrates include:  Bread, cereal, rice, pasta, and crackers.  Potatoes and corn.  Peas, beans, and lentils.  Milk and yogurt.  Fruit and juice.  Desserts, such as cakes, cookies, ice cream, and candy.  How does alcohol affect me? Alcohol can cause a sudden decrease in blood glucose (hypoglycemia), especially if you use insulin or take certain oral diabetes medicines. Hypoglycemia can be a life-threatening condition. Symptoms of hypoglycemia (sleepiness, dizziness, and confusion) are similar to symptoms of having too much alcohol. If your health care provider says that alcohol is safe for you, follow these guidelines:  Limit alcohol intake to no more than 1 drink per day for nonpregnant women and 2 drinks per day for men. One drink equals 12 oz of beer, 5 oz of wine, or 1 oz of hard liquor.  Do not drink on an empty stomach.  Keep yourself hydrated with water, diet soda, or unsweetened iced tea.  Keep in mind that regular soda, juice, and other mixers may contain a lot of sugar and must be counted as carbohydrates.  What are tips for following this plan? Reading food labels  Start by checking the serving size on the label. The amount of calories, carbohydrates, fats, and other nutrients listed  on the label are based on one serving of the food. Many foods contain more than one serving per package.  Check the total grams (g) of carbohydrates in one serving. You can calculate the number of servings of carbohydrates in one serving by dividing the total carbohydrates by 15. For example, if a food has 30 g of total carbohydrates, it would be equal to 2 servings of  carbohydrates.  Check the number of grams (g) of saturated and trans fats in one serving. Choose foods that have low or no amount of these fats.  Check the number of milligrams (mg) of sodium in one serving. Most people should limit total sodium intake to less than 2,300 mg per day.  Always check the nutrition information of foods labeled as "low-fat" or "nonfat". These foods may be higher in added sugar or refined carbohydrates and should be avoided.  Talk to your dietitian to identify your daily goals for nutrients listed on the label. Shopping  Avoid buying canned, premade, or processed foods. These foods tend to be high in fat, sodium, and added sugar.  Shop around the outside edge of the grocery store. This includes fresh fruits and vegetables, bulk grains, fresh meats, and fresh dairy. Cooking  Use low-heat cooking methods, such as baking, instead of high-heat cooking methods like deep frying.  Cook using healthy oils, such as olive, canola, or sunflower oil.  Avoid cooking with butter, cream, or high-fat meats. Meal planning  Eat meals and snacks regularly, preferably at the same times every day. Avoid going long periods of time without eating.  Eat foods high in fiber, such as fresh fruits, vegetables, beans, and whole grains. Talk to your dietitian about how many servings of carbohydrates you can eat at each meal.  Eat 4-6 ounces of lean protein each day, such as lean meat, chicken, fish, eggs, or tofu. 1 ounce is equal to 1 ounce of meat, chicken, or fish, 1 egg, or 1/4 cup of tofu.  Eat some foods each day that contain healthy fats, such as avocado, nuts, seeds, and fish. Lifestyle   Check your blood glucose regularly.  Exercise at least 30 minutes 5 or more days each week, or as told by your health care provider.  Take medicines as told by your health care provider.  Do not use any products that contain nicotine or tobacco, such as cigarettes and e-cigarettes. If  you need help quitting, ask your health care provider.  Work with a Social worker or diabetes educator to identify strategies to manage stress and any emotional and social challenges. What are some questions to ask my health care provider?  Do I need to meet with a diabetes educator?  Do I need to meet with a dietitian?  What number can I call if I have questions?  When are the best times to check my blood glucose? Where to find more information:  American Diabetes Association: diabetes.org/food-and-fitness/food  Academy of Nutrition and Dietetics: PokerClues.dk  [employer] of Diabetes and Digestive and Kidney Diseases (NIH): ContactWire.be Summary  A healthy meal plan will help you control your blood glucose and maintain a healthy lifestyle.  Working with a diet and nutrition specialist (dietitian) can help you make a meal plan that is best for you.  Keep in mind that carbohydrates and alcohol have immediate effects on your blood glucose levels. It is important to count carbohydrates and to use alcohol carefully. This information is not intended to replace advice given to you by your  health care provider. Make sure you discuss any questions you have with your health care provider. Document Released: 12/29/2004 Document Revised: 05/08/2016 Document Reviewed: 05/08/2016 Elsevier Interactive Patient Education  [employer].

## 2018-04-02 NOTE — Patient Instructions (Signed)
INR today: 3.1, patient did just complete augmentin 2 days ago.  *He has already taken dose today (04/02/18) so will hold tomorrow's dose (12/18) and then resume prior dosing of 2.5 mg all days except 3.5 mg on Mondays.  Re-check in 4 weeks.   Patient denies any unusual bruising or bleeding and understands risks associated with supratherapeutic level and will go to the ER if any concerns.

## 2018-04-02 NOTE — Assessment & Plan Note (Signed)
Repeat A1C pending today. Foot exam today. Pneumonia vaccination UTD. Managed on ACE and statin. Eye exam UTD. Continue current mediation regimen for now.  Follow up in 6 months.

## 2018-04-08 ENCOUNTER — Other Ambulatory Visit: Payer: Self-pay | Admitting: *Deleted

## 2018-04-08 MED ORDER — METFORMIN HCL 500 MG PO TABS: ORAL_TABLET | ORAL | 1 refills | Status: DC

## 2018-04-12 ENCOUNTER — Encounter: Payer: Self-pay | Admitting: Internal Medicine

## 2018-04-12 ENCOUNTER — Telehealth: Payer: Self-pay | Admitting: Urology

## 2018-04-12 NOTE — Telephone Encounter (Signed)
Pt called office stating his pcp ([facility] [place]) told him we had been trying to get in touch with him but his v/m had not been set up.  It looks like pt was scheduled for prostate biopsy two different times and cancelled.  I told pt to call back on Monday.

## 2018-04-22 ENCOUNTER — Other Ambulatory Visit: Payer: Self-pay | Admitting: Primary Care

## 2018-04-22 ENCOUNTER — Telehealth: Payer: Self-pay | Admitting: Primary Care

## 2018-04-22 DIAGNOSIS — F339 Major depressive disorder, recurrent, unspecified: Secondary | ICD-10-CM

## 2018-04-22 DIAGNOSIS — E1140 Type 2 diabetes mellitus with diabetic neuropathy, unspecified: Secondary | ICD-10-CM

## 2018-04-22 NOTE — Telephone Encounter (Signed)
Pt called office in regards to getting a referral for a pediatrist. He was seen on 04/02/18 and he said he spoke with Anda Kraft about a referral. Pt is requesting a call back

## 2018-04-22 NOTE — Telephone Encounter (Signed)
Have not been prescribed by Anda Kraft.Last prescribed previous pcp. Last office visit on 04/02/2018. Next future appointment on 09/23/2018

## 2018-04-22 NOTE — Telephone Encounter (Signed)
Tried to call patient and unable to contact.

## 2018-04-22 NOTE — Telephone Encounter (Signed)
Noted, refill sent to pharmacy. 

## 2018-04-22 NOTE — Telephone Encounter (Signed)
Please notify patient that the referral was placed. Someone should be in touch within this week regarding an appointment date and time.

## 2018-04-22 NOTE — Addendum Note (Signed)
Addended by: [name] on: 04/22/2018 12:49 PM   Modules accepted: Orders

## 2018-04-24 NOTE — Telephone Encounter (Signed)
Spoken and notified patient of Kate Clark's comments. Patient verbalized understanding.  

## 2018-04-29 ENCOUNTER — Telehealth: Payer: Self-pay

## 2018-04-29 NOTE — Telephone Encounter (Signed)
Spoke with patient.  He has just made [place] aware today that he has been on a 5 day hold on his coumadin due to having a prostate bx on Wednesday (05/01/18).    I have asked that he please make [place] aware as soon as possible in the future if he will be needing to hold his medication so we can review hold/boost/recheck plans prior to his stopping the medication.  Also, we need to review whether he may be a candidate for lovenox.  Allie Bossier, NP,  I do not see where patient has a significant hx or risk that he would need a lovenox bridge and at this point it may be mostly a mute point as his procedure is Wednesday, just makes me nervous with him being on a total 5 day hold.  I did want to bring this to your attention in case you had any specific concerns or recommendations.  I will follow up with patient on Thursday to lay out boost and recheck INR plans following his procedure.  Please let me know if you have any thoughts or concerns.   Thanks.

## 2018-04-30 NOTE — Telephone Encounter (Signed)
Noted.  It is unfortunate that we were not alerted of this 5-day Coumadin hold, but the plan outlined below seems appropriate.  Please touch base with me Thursday this week with his follow-up INR and Coumadin dosing.

## 2018-05-01 ENCOUNTER — Ambulatory Visit (INDEPENDENT_AMBULATORY_CARE_PROVIDER_SITE_OTHER): Payer: Medicare Other | Admitting: Urology

## 2018-05-01 ENCOUNTER — Other Ambulatory Visit: Payer: Self-pay | Admitting: Urology

## 2018-05-01 ENCOUNTER — Encounter: Payer: Self-pay | Admitting: Urology

## 2018-05-01 VITALS — BP 118/82 | HR 96 | Ht 71.0 in | Wt 229.0 lb

## 2018-05-01 DIAGNOSIS — N402 Nodular prostate without lower urinary tract symptoms: Secondary | ICD-10-CM

## 2018-05-01 DIAGNOSIS — R972 Elevated prostate specific antigen [PSA]: Secondary | ICD-10-CM | POA: Diagnosis not present

## 2018-05-01 MED ORDER — LEVOFLOXACIN 500 MG PO TABS
500.0000 mg | ORAL_TABLET | Freq: Once | ORAL | Status: AC
  Administered 2018-05-01: 500 mg via ORAL

## 2018-05-01 MED ORDER — GENTAMICIN SULFATE 40 MG/ML IJ SOLN
80.0000 mg | Freq: Once | INTRAMUSCULAR | Status: AC
  Administered 2018-05-01: 80 mg via INTRAMUSCULAR

## 2018-05-01 NOTE — Progress Notes (Signed)
   05/01/18  CC:  Chief Complaint  Patient presents with  . Prostate Biopsy    HPI: 68 yo M with history of elevated PSA and abnormal rectal exam.    In the interim, he has cancelled his biopsy multiple times.    Most recent PSA 7.4 on 11/2017, rising.   Blood pressure 118/82, pulse 96, height 5\' 11"  (1.803 m), weight 229 lb (103.9 kg). NED. A&Ox3.   No respiratory distress   Abd soft, NT, ND Normal sphincter tone  Prostate Biopsy Procedure   Informed consent was obtained after discussing risks/benefits of the procedure.  A time out was performed to ensure correct patient identity.  Pre-Procedure: - Last PSA Level:  Lab Results  Component Value Date   PSA 6.10 (H) 09/21/2017   PSA 4.33 (H) 10/27/2015   PSA 4.71 (H) 06/22/2014   - Gentamicin given prophylactically - Levaquin 500 mg administered PO -Transrectal Ultrasound performed revealing a 50 gm prostate -Small median lobe noted  -Numberous areas with hypoechoic, irregular and highly suspicious  Procedure: - Prostate block performed using 10 cc 1% lidocaine and biopsies taken from sextant areas, a total of 12 under ultrasound guidance.  Post-Procedure: - Patient tolerated the procedure well - He was counseled to seek immediate medical attention if experiences any severe pain, significant bleeding, or fevers - Return to discuss biopsy results   Hollice Espy, MD

## 2018-05-07 ENCOUNTER — Other Ambulatory Visit: Payer: Self-pay | Admitting: Urology

## 2018-05-07 LAB — PATHOLOGY REPORT

## 2018-05-08 ENCOUNTER — Ambulatory Visit (INDEPENDENT_AMBULATORY_CARE_PROVIDER_SITE_OTHER): Payer: Medicare Other | Admitting: Podiatry

## 2018-05-08 ENCOUNTER — Ambulatory Visit (INDEPENDENT_AMBULATORY_CARE_PROVIDER_SITE_OTHER): Payer: Medicare Other

## 2018-05-08 ENCOUNTER — Encounter: Payer: Self-pay | Admitting: Podiatry

## 2018-05-08 ENCOUNTER — Other Ambulatory Visit: Payer: Self-pay | Admitting: Podiatry

## 2018-05-08 DIAGNOSIS — E1142 Type 2 diabetes mellitus with diabetic polyneuropathy: Secondary | ICD-10-CM

## 2018-05-08 DIAGNOSIS — R0989 Other specified symptoms and signs involving the circulatory and respiratory systems: Secondary | ICD-10-CM | POA: Diagnosis not present

## 2018-05-08 DIAGNOSIS — I442 Atrioventricular block, complete: Secondary | ICD-10-CM

## 2018-05-08 DIAGNOSIS — I482 Chronic atrial fibrillation, unspecified: Secondary | ICD-10-CM

## 2018-05-08 DIAGNOSIS — E119 Type 2 diabetes mellitus without complications: Secondary | ICD-10-CM

## 2018-05-08 DIAGNOSIS — M79671 Pain in right foot: Secondary | ICD-10-CM

## 2018-05-08 DIAGNOSIS — Q828 Other specified congenital malformations of skin: Secondary | ICD-10-CM | POA: Diagnosis not present

## 2018-05-08 NOTE — Progress Notes (Signed)
Subjective:  Patient ID: [name] Cook, male    DOB: 1950/04/29,  MRN: [id number] HPI Chief Complaint  Patient presents with  . Callouses    Patient presents today with painful callous bottom of left forefoot x 1-2 months.  He states "I feels like Im stepping on needles"  He has not done anything to treat callous  . Peripheral Neuropathy    Patient c/o neuropathy bilat feet/toes x years.  He reports his toes are like pins and needles and numb at times.  He is currently taking Gabapentin which is not helping    68 y.o. male presents with the above complaint.   ROS: Denies fever chills nausea vomiting muscle aches pains calf pain back pain chest pain shortness of breath.  Past Medical History:  Diagnosis Date  . Arthritis    "knees and lower back" (03/14/2013)  . Atrial flutter ([place])    radiofrequency ablation in 2001  . CAD (coronary artery disease)    a. Nonobstructive. Cardiac cath in 2001-50% mid RI, normal LM, LAD, RCA b. cath 10/16/2014 95% mid RCA treated with DES, 99% ost D1 medical management due to small aneurysmal segment  . Chronic anticoagulation    chronic Coumadin anticoagulation  . Chronic obstructive pulmonary disease ([place]) 04/20/2011  . Diabetes mellitus, type 2 ([place])   . GERD (gastroesophageal reflux disease)   . Hyperlipidemia   . Hypertension    with hypertensive heart disease  . Obesity   . Persistent atrial fibrillation    recurrent atrial flutter since 2001 s/p DCCVs, multiple failed AADs, h/o tachy-mediated cardiomyopathy  . Shortness of breath    "can come on at any time" (03/14/2013)  . Sleep apnea    "dx'd; couldn't wear the mask" (03/14/2013)  . Tobacco abuse    Past Surgical History:  Procedure Laterality Date  . ATRIAL FLUTTER ABLATION  2002   atrial flutter; subsequently developed atrial fibrillation  . AV NODE ABLATION  01/24/2013  . CARDIAC CATHETERIZATION  2002  . CARDIAC CATHETERIZATION N/A 10/16/2014   Procedure: Left Heart Cath and Coronary  Angiography;  Surgeon: [name], MD;  Location: [facility];  Service: Cardiovascular;  Laterality: N/A;  . CARDIOVERSION  05/31/2011   Procedure: CARDIOVERSION;  Surgeon: Cristopher Estimable. Lattie Haw, MD;  Location: [facility];  Service: Cardiovascular;  Laterality: N/A;  . CARPAL TUNNEL RELEASE Left 1980's  . INSERT / REPLACE / REMOVE PACEMAKER  01/24/2013    Medtronic Adapta L dual-chamber pacemaker, serial number [id number]   . LEFT HEART CATHETERIZATION WITH CORONARY ANGIOGRAM N/A 03/17/2013   Procedure: LEFT HEART CATHETERIZATION WITH CORONARY ANGIOGRAM;  Surgeon: Burnell Blanks, MD; LAD mild dz, D1 branch 100%, inferior branch 99%, CFX OK, RCA 50%, EF 65%    . LOOP RECORDER IMPLANT  2002  . PERMANENT PACEMAKER INSERTION N/A 01/24/2013   Procedure: PERMANENT PACEMAKER INSERTION;  Surgeon: [name], MD;  [facility] CATH LAB;  Service: Cardiovascular;  Laterality: N/A;  . TIBIAL TUBERCLERPLASTY  ~ 2003    Current Outpatient Medications:  .  blood glucose meter kit and supplies KIT, Dispense based on patient and insurance preference. Use up to four times daily as directed. (FOR ICD-9 250.00, 250.01)., Disp: 1 each, Rfl: 0 .  buPROPion (ZYBAN) 150 MG 12 hr tablet, TAKE 1 TABLET BY MOUTH TWICE A DAY, Disp: 180 tablet, Rfl: 1 .  clopidogrel (PLAVIX) 75 MG tablet, TAKE 1 TABLET BY MOUTH DAILY (Patient not taking: Reported on 05/01/2018), Disp: 90 tablet,  Rfl: 3 .  empagliflozin (JARDIANCE) 10 MG TABS tablet, Take 1 tablet by mouth every morning for diabetes., Disp: 90 tablet, Rfl: 0 .  escitalopram (LEXAPRO) 10 MG tablet, TAKE 1 TABLET BY MOUTH EVERY DAY, Disp: 90 tablet, Rfl: 1 .  fish oil-omega-3 fatty acids 1000 MG capsule, Take 1 g by mouth daily.  , Disp: , Rfl:  .  fluticasone (FLONASE) 50 MCG/ACT nasal spray, Place 1 spray into both nostrils daily as needed for allergies or rhinitis., Disp: 48 g, Rfl: 3 .  furosemide (LASIX) 20 MG tablet, TAKE 1 TABLET BY MOUTH EVERY DAY,  Disp: 90 tablet, Rfl: 2 .  gabapentin (NEURONTIN) 300 MG capsule, Take 1 capsule by mouth three times daily for diabetic neuropathy., Disp: 270 capsule, Rfl: 2 .  glucose blood (ONE TOUCH ULTRA TEST) test strip, USE TO TEST UP TO 4 TIMES DAILY, Disp: 100 each, Rfl: 2 .  isosorbide mononitrate (IMDUR) 30 MG 24 hr tablet, TAKE 1 TABLET BY MOUTH EVERY DAY, Disp: 90 tablet, Rfl: 1 .  lisinopril (PRINIVIL,ZESTRIL) 20 MG tablet, TAKE 1 TABLET BY MOUTH EVERY DAY, Disp: 90 tablet, Rfl: 1 .  metFORMIN (GLUCOPHAGE) 500 MG tablet, TAKE 1 TABLET BY MOUTH TWICE DAILY WITH A MEAL, Disp: 180 tablet, Rfl: 1 .  metoprolol succinate (TOPROL-XL) 50 MG 24 hr tablet, Take 1 tablet by mouth once daily for blood pressure and heart rate.Take with or immediately following a meal., Disp: 90 tablet, Rfl: 2 .  nitroGLYCERIN (NITROSTAT) 0.4 MG SL tablet, Place 1 tablet (0.4 mg total) under the tongue every 5 (five) minutes as needed for chest pain., Disp: 25 tablet, Rfl: 0 .  pantoprazole (PROTONIX) 40 MG tablet, TAKE 1 TABLET BY MOUTH ONCE DAILY, Disp: 90 tablet, Rfl: 3 .  promethazine (PHENERGAN) 25 MG tablet, Take 1 tablet (25 mg total) by mouth every 8 (eight) hours as needed for nausea or vomiting., Disp: 20 tablet, Rfl: 0 .  rosuvastatin (CRESTOR) 20 MG tablet, Take 1 tablet by mouth every evening for cholesterol., Disp: 90 tablet, Rfl: 2 .  sitaGLIPtin (JANUVIA) 100 MG tablet, Take 1 tablet by mouth once daily for diabetes., Disp: 90 tablet, Rfl: 2 .  tamsulosin (FLOMAX) 0.4 MG CAPS capsule, Take 1 capsule (0.4 mg total) by mouth daily., Disp: 30 capsule, Rfl: 11 .  traMADol (ULTRAM) 50 MG tablet, Take 1 tablet (50 mg total) by mouth every 6 (six) hours as needed for severe pain. Caution of sedation, Disp: 20 tablet, Rfl: 0 .  warfarin (COUMADIN) 1 MG tablet, TAKE 1 TABLET ON MONDAYS. PT. USES 1 MG TABLETS IN ADDITION TO 5 MG TABLETS. (Patient not taking: Reported on 05/01/2018), Disp: 90 tablet, Rfl: 0 .  warfarin  (COUMADIN) 5 MG tablet, TAKE 1 TABLET BY MOUTH ON MONDAYS AND 1/2 TABLET ALL OTHER DAYS (Patient not taking: Reported on 05/01/2018), Disp: 48 tablet, Rfl: 7  No Known Allergies Review of Systems Objective:  There were no vitals filed for this visit.  General: Well developed, nourished, in no acute distress, alert and oriented x3   Dermatological: Skin is warm, dry and supple bilateral. Nails x 10 are well maintained; remaining integument appears unremarkable at this time. There are no open sores, no preulcerative lesions, no rash or signs of infection present.  Vascular: Dorsalis Pedis artery and Posterior Tibial artery pedal pulses are 1/4 bilateral with i delayed capillary fill time. Pedal hair growth present. No varicosities and no lower extremity edema present bilateral.   Neruologic: Grossly  intact via light touch bilateral. Vibratory intact via tuning fork bilateral. Protective threshold with Semmes Wienstein monofilament diminished to all pedal sites bilateral. Patellar and Achilles deep tendon reflexes 2+ bilateral. No Babinski or clonus noted bilateral.   Musculoskeletal: No gross boney pedal deformities bilateral. No pain, crepitus, or limitation noted with foot and ankle range of motion bilateral. Muscular strength 5/5 in all groups tested bilateral.  Gait: Unassisted, Nonantalgic.    Radiographs:  No acute findings.  Assessment & Plan:   Assessment: Neuropathy diabetic peripheral neuropathy diabetic angiopathy.  Porokeratotic lesion plantar aspect forefoot left.  Plan: Increase his gabapentin 600 mg 3 times a day I also debrided the reactive hyperkeratotic lesion and placed salicylic acid under occlusion to be washed off thoroughly within the next day or so.  Would also like to get him with [employer] [employer] for a pair of diabetic shoes     Jorden Minchey T. [place], [place]

## 2018-05-09 LAB — CUP PACEART REMOTE DEVICE CHECK
Battery Impedance: 276 Ohm
Battery Remaining Longevity: 116 mo
Battery Voltage: 2.8 V
Brady Statistic RV Percent Paced: 100 %
Date Time Interrogation Session: 20200122161111
Implantable Lead Implant Date: 20141010
Implantable Lead Implant Date: 20141010
Implantable Lead Location: 753859
Implantable Lead Location: 753860
Implantable Lead Model: 5076
Implantable Lead Model: 5076
Implantable Pulse Generator Implant Date: 20141010
Lead Channel Impedance Value: 67 Ohm
Lead Channel Impedance Value: 728 Ohm
Lead Channel Pacing Threshold Amplitude: 0.75 V
Lead Channel Pacing Threshold Pulse Width: 0.4 ms
Lead Channel Setting Pacing Amplitude: 2.5 V
Lead Channel Setting Pacing Pulse Width: 0.4 ms
Lead Channel Setting Sensing Sensitivity: 4 mV

## 2018-05-09 NOTE — Progress Notes (Signed)
Remote pacemaker transmission.   

## 2018-05-10 ENCOUNTER — Encounter: Payer: Self-pay | Admitting: Cardiology

## 2018-05-10 ENCOUNTER — Ambulatory Visit (INDEPENDENT_AMBULATORY_CARE_PROVIDER_SITE_OTHER): Payer: Medicare Other | Admitting: Internal Medicine

## 2018-05-10 ENCOUNTER — Encounter: Payer: Self-pay | Admitting: Internal Medicine

## 2018-05-10 VITALS — BP 110/70 | HR 99 | Ht 71.0 in | Wt 227.0 lb

## 2018-05-10 DIAGNOSIS — Z950 Presence of cardiac pacemaker: Secondary | ICD-10-CM | POA: Diagnosis not present

## 2018-05-10 DIAGNOSIS — I482 Chronic atrial fibrillation, unspecified: Secondary | ICD-10-CM

## 2018-05-10 DIAGNOSIS — I442 Atrioventricular block, complete: Secondary | ICD-10-CM

## 2018-05-10 LAB — CUP PACEART INCLINIC DEVICE CHECK
Battery Impedance: 276 Ohm
Battery Remaining Longevity: 115 mo
Battery Voltage: 2.8 V
Brady Statistic RV Percent Paced: 100 %
Date Time Interrogation Session: 20200124132212
Implantable Lead Implant Date: 20141010
Implantable Lead Implant Date: 20141010
Implantable Lead Location: 753859
Implantable Lead Location: 753860
Implantable Lead Model: 5076
Implantable Lead Model: 5076
Implantable Pulse Generator Implant Date: 20141010
Lead Channel Impedance Value: 67 Ohm
Lead Channel Impedance Value: 678 Ohm
Lead Channel Pacing Threshold Amplitude: 0.75 V
Lead Channel Pacing Threshold Pulse Width: 0.4 ms
Lead Channel Setting Pacing Amplitude: 2.5 V
Lead Channel Setting Pacing Pulse Width: 0.4 ms
Lead Channel Setting Sensing Sensitivity: 4 mV

## 2018-05-10 MED ORDER — METOPROLOL SUCCINATE ER 25 MG PO TB24: 25.0000 mg | ORAL_TABLET | Freq: Every day | ORAL | 3 refills | Status: DC

## 2018-05-10 NOTE — Progress Notes (Signed)
HPI [name] Cook returns today for followup of his PPM, atrial fib and HTN. He is a pleasant 68 yo man with the above problems and diastolic heart failure. His atrial fib could not be controlled. He underwent AV node ablation several years ago. In the interim, he has done well. He gets fatigued if he tries to rush. He has not had syncope and minimal peripheral edema. He does note some palpitations when he gets in a hurry.  No Known Allergies   Current Outpatient Medications  Medication Sig Dispense Refill  . blood glucose meter kit and supplies KIT Dispense based on patient and insurance preference. Use up to four times daily as directed. (FOR ICD-9 250.00, 250.01). 1 each 0  . buPROPion (ZYBAN) 150 MG 12 hr tablet TAKE 1 TABLET BY MOUTH TWICE A DAY 180 tablet 1  . clopidogrel (PLAVIX) 75 MG tablet TAKE 1 TABLET BY MOUTH DAILY 90 tablet 3  . empagliflozin (JARDIANCE) 10 MG TABS tablet Take 1 tablet by mouth every morning for diabetes. 90 tablet 0  . escitalopram (LEXAPRO) 10 MG tablet TAKE 1 TABLET BY MOUTH EVERY DAY 90 tablet 1  . fish oil-omega-3 fatty acids 1000 MG capsule Take 1 g by mouth daily.      . fluticasone (FLONASE) 50 MCG/ACT nasal spray Place 1 spray into both nostrils daily as needed for allergies or rhinitis. 48 g 3  . furosemide (LASIX) 20 MG tablet TAKE 1 TABLET BY MOUTH EVERY DAY 90 tablet 2  . gabapentin (NEURONTIN) 300 MG capsule Take 1 capsule by mouth three times daily for diabetic neuropathy. 270 capsule 2  . glucose blood (ONE TOUCH ULTRA TEST) test strip USE TO TEST UP TO 4 TIMES DAILY 100 each 2  . isosorbide mononitrate (IMDUR) 30 MG 24 hr tablet TAKE 1 TABLET BY MOUTH EVERY DAY 90 tablet 1  . lisinopril (PRINIVIL,ZESTRIL) 20 MG tablet TAKE 1 TABLET BY MOUTH EVERY DAY 90 tablet 1  . metFORMIN (GLUCOPHAGE) 500 MG tablet TAKE 1 TABLET BY MOUTH TWICE DAILY WITH A MEAL 180 tablet 1  . nitroGLYCERIN (NITROSTAT) 0.4 MG SL tablet Place 1 tablet (0.4 mg total) under  the tongue every 5 (five) minutes as needed for chest pain. 25 tablet 0  . pantoprazole (PROTONIX) 40 MG tablet TAKE 1 TABLET BY MOUTH ONCE DAILY 90 tablet 3  . promethazine (PHENERGAN) 25 MG tablet Take 1 tablet (25 mg total) by mouth every 8 (eight) hours as needed for nausea or vomiting. 20 tablet 0  . rosuvastatin (CRESTOR) 20 MG tablet Take 1 tablet by mouth every evening for cholesterol. 90 tablet 2  . sitaGLIPtin (JANUVIA) 100 MG tablet Take 1 tablet by mouth once daily for diabetes. 90 tablet 2  . tamsulosin (FLOMAX) 0.4 MG CAPS capsule Take 1 capsule (0.4 mg total) by mouth daily. 30 capsule 11  . traMADol (ULTRAM) 50 MG tablet Take 1 tablet (50 mg total) by mouth every 6 (six) hours as needed for severe pain. Caution of sedation 20 tablet 0  . warfarin (COUMADIN) 1 MG tablet TAKE 1 TABLET ON MONDAYS. PT. USES 1 MG TABLETS IN ADDITION TO 5 MG TABLETS. 90 tablet 0  . warfarin (COUMADIN) 5 MG tablet TAKE 1 TABLET BY MOUTH ON MONDAYS AND 1/2 TABLET ALL OTHER DAYS 48 tablet 7  . metoprolol succinate (TOPROL XL) 25 MG 24 hr tablet Take 1 tablet (25 mg total) by mouth daily. 90 tablet 3   No current facility-administered  medications for this visit.      Past Medical History:  Diagnosis Date  . Arthritis    "knees and lower back" (03/14/2013)  . Atrial flutter ([place])    radiofrequency ablation in 2001  . CAD (coronary artery disease)    a. Nonobstructive. Cardiac cath in 2001-50% mid RI, normal LM, LAD, RCA b. cath 10/16/2014 95% mid RCA treated with DES, 99% ost D1 medical management due to small aneurysmal segment  . Chronic anticoagulation    chronic Coumadin anticoagulation  . Chronic obstructive pulmonary disease ([place]) 04/20/2011  . Diabetes mellitus, type 2 ([place])   . GERD (gastroesophageal reflux disease)   . Hyperlipidemia   . Hypertension    with hypertensive heart disease  . Obesity   . Persistent atrial fibrillation    recurrent atrial flutter since 2001 s/p DCCVs, multiple  failed AADs, h/o tachy-mediated cardiomyopathy  . Shortness of breath    "can come on at any time" (03/14/2013)  . Sleep apnea    "dx'd; couldn't wear the mask" (03/14/2013)  . Tobacco abuse     ROS:   All systems reviewed and negative except as noted in the HPI.   Past Surgical History:  Procedure Laterality Date  . ATRIAL FLUTTER ABLATION  2002   atrial flutter; subsequently developed atrial fibrillation  . AV NODE ABLATION  01/24/2013  . CARDIAC CATHETERIZATION  2002  . CARDIAC CATHETERIZATION N/A 10/16/2014   Procedure: Left Heart Cath and Coronary Angiography;  Surgeon: [name], MD;  Location: [facility];  Service: Cardiovascular;  Laterality: N/A;  . CARDIOVERSION  05/31/2011   Procedure: CARDIOVERSION;  Surgeon: Cristopher Estimable. Lattie Haw, MD;  Location: [facility];  Service: Cardiovascular;  Laterality: N/A;  . CARPAL TUNNEL RELEASE Left 1980's  . INSERT / REPLACE / REMOVE PACEMAKER  01/24/2013    Medtronic Adapta L dual-chamber pacemaker, serial number [id number]   . LEFT HEART CATHETERIZATION WITH CORONARY ANGIOGRAM N/A 03/17/2013   Procedure: LEFT HEART CATHETERIZATION WITH CORONARY ANGIOGRAM;  Surgeon: Burnell Blanks, MD; LAD mild dz, D1 branch 100%, inferior branch 99%, CFX OK, RCA 50%, EF 65%    . LOOP RECORDER IMPLANT  2002  . PERMANENT PACEMAKER INSERTION N/A 01/24/2013   Procedure: PERMANENT PACEMAKER INSERTION;  Surgeon: [name], MD;  Location: [facility] C[facility];  Service: Cardiovascular;  Laterality: N/A;  . TIBIAL TUBERCLERPLASTY  ~ 2003     Family History  Problem Relation Age of Onset  . Alzheimer's disease Mother   . Osteoporosis Mother      Social History   Socioeconomic History  . Marital status: Legally Separated    Spouse name: Not on file  . Number of children: 1  . Years of education: Not on file  . Highest education level: Not on file  Occupational History  . Occupation: Merchandiser, retail: UNEMPLOYED  Social Needs  .  Financial resource strain: Not on file  . Food insecurity:    Worry: Not on file    Inability: Not on file  . Transportation needs:    Medical: Not on file    Non-medical: Not on file  Tobacco Use  . Smoking status: Former Smoker    Packs/day: 1.00    Years: 42.00    Pack years: 42.00    Types: Cigarettes    Last attempt to quit: 12/31/2011    Years since quitting: 6.3  . Smokeless tobacco: Never Used  Substance and Sexual Activity  . Alcohol use:  Not Currently    Comment: 03/14/2013 "stopped drinking back in 2002; never had problem w/it"  . Drug use: No  . Sexual activity: Not Currently  Lifestyle  . Physical activity:    Days per week: Not on file    Minutes per session: Not on file  . Stress: Not on file  Relationships  . Social connections:    Talks on phone: Not on file    Gets together: Not on file    Attends religious service: Not on file    Active member of club or organization: Not on file    Attends meetings of clubs or organizations: Not on file    Relationship status: Not on file  . Intimate partner violence:    Fear of current or ex partner: Not on file    Emotionally abused: Not on file    Physically abused: Not on file    Forced sexual activity: Not on file  Other Topics Concern  . Not on file  Social History Narrative   Single.   Retired.    1 son, deceased.    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   Enjoys playing on the computer.         BP 110/70   Pulse 99   Ht _0  (1.803 m)   Wt 227 lb (103 kg)   SpO2 98%   BMI 31.66 kg/m   Physical Exam:  Well appearing NAD HEENT: Unremarkable Neck:  No JVD, no thyromegally Lymphatics:  No adenopathy Back:  No CVA tenderness Lungs:  Clear HEART:  Regular rate rhythm, no murmurs, no rubs, no clicks Abd:  soft, positive bowel sounds, no organomegally, no rebound, no guarding Ext:  2 plus pulses, no edema, no cyanosis, no clubbing Skin:  No rashes no  nodules Neuro:  CN II through XII intact, motor grossly intact  EKG - none  DEVICE  Normal device function.  See PaceArt for details.   Assess/Plan: 1. Atrial fib - his rate is well controlled. He will reduce his dose of toprol. 2. HTN - his blood pressure is well controlled.  3. CAD - he denies anginal symptoms.  4. PPM - we turned down his rate response and upper rate.   [name] Cook.D.

## 2018-05-10 NOTE — Telephone Encounter (Signed)
Noted and agree. 

## 2018-05-10 NOTE — Patient Instructions (Addendum)
Medication Instructions:  Your physician has recommended you make the following change in your medication:   1.  Reduce your Toprol XL 50 mg---Take 1/2 tablet (25 mg) by mouth daily.    When you run out of 50 mg tablets your next prescription will be for 25 mg tablets   Labwork: None ordered.  Testing/Procedures: None ordered.  Follow-Up: Your physician wants you to follow-up in: one year with [name].   You will receive a reminder letter in the mail two months in advance. If you don't receive a letter, please call our office to schedule the follow-up appointment.  Remote monitoring is used to monitor your Pacemaker from home. This monitoring reduces the number of office visits required to check your device to one time per year. It allows [place] to keep an eye on the functioning of your device to ensure it is working properly. You are scheduled for a device check from home on 08/07/2018. You may send your transmission at any time that day. If you have a wireless device, the transmission will be sent automatically. After your physician reviews your transmission, you will receive a postcard with your next transmission date.  Any Other Special Instructions Will Be Listed Below (If Applicable).  If you need a refill on your cardiac medications before your next appointment, please call your pharmacy.

## 2018-05-10 NOTE — Telephone Encounter (Signed)
Patient came by on 1/16 and met with Villa Herb, RN to review coumadin boost dosing instructions/plan.  Appears she has him set to recheck an INR on 1/28 with her in the clinic.  He was doing well post procedure without any complications.   FYI to Allie Bossier, NP

## 2018-05-14 ENCOUNTER — Other Ambulatory Visit: Payer: Self-pay | Admitting: Primary Care

## 2018-05-14 ENCOUNTER — Encounter: Payer: Self-pay | Admitting: Podiatry

## 2018-05-14 ENCOUNTER — Ambulatory Visit (INDEPENDENT_AMBULATORY_CARE_PROVIDER_SITE_OTHER): Payer: Medicare Other | Admitting: Podiatry

## 2018-05-14 ENCOUNTER — Ambulatory Visit (INDEPENDENT_AMBULATORY_CARE_PROVIDER_SITE_OTHER): Payer: Medicare Other

## 2018-05-14 DIAGNOSIS — I4819 Other persistent atrial fibrillation: Secondary | ICD-10-CM | POA: Diagnosis not present

## 2018-05-14 DIAGNOSIS — L989 Disorder of the skin and subcutaneous tissue, unspecified: Secondary | ICD-10-CM

## 2018-05-14 DIAGNOSIS — M7752 Other enthesopathy of left foot: Secondary | ICD-10-CM | POA: Diagnosis not present

## 2018-05-14 DIAGNOSIS — E1142 Type 2 diabetes mellitus with diabetic polyneuropathy: Secondary | ICD-10-CM

## 2018-05-14 DIAGNOSIS — E119 Type 2 diabetes mellitus without complications: Secondary | ICD-10-CM

## 2018-05-14 LAB — POCT INR: INR: 3 (ref 2.0–3.0)

## 2018-05-14 NOTE — Patient Instructions (Signed)
NR today: 3.0,   Continue taking 2.5 mg all days except 3.5 mg on Mondays.  Re-check in 4 weeks.   Patient denies any changes to diet health or medications.  He will be having an upcoming colonoscopy but it hasn't been scheduled yet.  He will let us know once it is so we can discuss hold/boost instructions.

## 2018-05-15 ENCOUNTER — Ambulatory Visit (INDEPENDENT_AMBULATORY_CARE_PROVIDER_SITE_OTHER): Payer: Medicare Other | Admitting: Urology

## 2018-05-15 ENCOUNTER — Encounter: Payer: Self-pay | Admitting: Urology

## 2018-05-15 VITALS — BP 115/73 | HR 93 | Ht 71.0 in | Wt 230.0 lb

## 2018-05-15 DIAGNOSIS — N138 Other obstructive and reflux uropathy: Secondary | ICD-10-CM | POA: Diagnosis not present

## 2018-05-15 DIAGNOSIS — N401 Enlarged prostate with lower urinary tract symptoms: Secondary | ICD-10-CM

## 2018-05-15 DIAGNOSIS — C61 Malignant neoplasm of prostate: Secondary | ICD-10-CM | POA: Diagnosis not present

## 2018-05-15 NOTE — Progress Notes (Signed)
05/15/2018 7:59 AM   [name] Cook 05-Aug-1950 [id number]  Referring provider: [name], NP [place], [place] [address]  Chief Complaint  Patient presents with  . Prostate Cancer    results    HPI: 68 yo M with newly diagnosed prostate cancer who presents today to discuss his biopsy results.  Prostate biopsy revealed 6/12 cores, all on the right including 3 cores of Gleason 3+3 (up to 99%) and 3 cores of 3+4 up to 96%.  High PSA 6.1.  Rectal exam was abnormal with a nodule at the right apex and firm on the side.  TRUS vol 50 with small median lobe, irregular on right.   He does have some moderate baseline urinary symptoms.  IPSS as below.  He also has fairly significant erectile dysfunction, Shim below.  No previous abdominal surgeries.  He does have multiple medical comorbidities including history of CAD, COPD, diabetes, hypertension, hyperlipidemia, neuropathy amongst others.  He is on chronic anticoagulation.   IPSS    Row Name 05/15/18 1100         International Prostate Symptom Score   How often have you had the sensation of not emptying your bladder?  About half the time     How often have you had to urinate less than every two hours?  Less than half the time     How often have you found you stopped and started again several times when you urinated?  Less than half the time     How often have you found it difficult to postpone urination?  Not at All     How often have you had a weak urinary stream?  About half the time     How often have you had to strain to start urination?  Not at All     How many times did you typically get up at night to urinate?  2 Times     Total IPSS Score  12       Quality of Life due to urinary symptoms   If you were to spend the rest of your life with your urinary condition just the way it is now how would you feel about that?  Unhappy        Score:  1-7 Mild 8-19 Moderate 20-35 Severe   SHIM    Row Name  05/15/18 1136         SHIM: Over the last 6 months:   How do you rate your confidence that you could get and keep an erection?  Moderate     During sexual intercourse, how often were you able to maintain your erection after you had penetrated (entered) your partner?  Almost Always or Always     During sexual intercourse, how difficult was it to maintain your erection to completion of intercourse?  Slightly Difficult     When you attempted sexual intercourse, how often was it satisfactory for you?  A Few Times (much less than half the time)       SHIM Total Score   SHIM  14          PMH: Past Medical History:  Diagnosis Date  . Arthritis    "knees and lower back" (03/14/2013)  . Atrial flutter ([place])    radiofrequency ablation in 2001  . CAD (coronary artery disease)    a. Nonobstructive. Cardiac cath in 2001-50% mid RI, normal LM, LAD, RCA b. cath 10/16/2014 95% mid RCA  treated with DES, 99% ost D1 medical management due to small aneurysmal segment  . Chronic anticoagulation    chronic Coumadin anticoagulation  . Chronic obstructive pulmonary disease ([place]) 04/20/2011  . Diabetes mellitus, type 2 ([place])   . GERD (gastroesophageal reflux disease)   . Hyperlipidemia   . Hypertension    with hypertensive heart disease  . Obesity   . Persistent atrial fibrillation    recurrent atrial flutter since 2001 s/p DCCVs, multiple failed AADs, h/o tachy-mediated cardiomyopathy  . Shortness of breath    "can come on at any time" (03/14/2013)  . Sleep apnea    "dx'd; couldn't wear the mask" (03/14/2013)  . Tobacco abuse     Surgical History: Past Surgical History:  Procedure Laterality Date  . ATRIAL FLUTTER ABLATION  2002   atrial flutter; subsequently developed atrial fibrillation  . AV NODE ABLATION  01/24/2013  . CARDIAC CATHETERIZATION  2002  . CARDIAC CATHETERIZATION N/A 10/16/2014   Procedure: Left Heart Cath and Coronary Angiography;  Surgeon: [name], MD;  Location: [facility];  Service: Cardiovascular;  Laterality: N/A;  . CARDIOVERSION  05/31/2011   Procedure: CARDIOVERSION;  Surgeon: Cristopher Estimable. Lattie Haw, MD;  Location: [facility];  Service: Cardiovascular;  Laterality: N/A;  . CARPAL TUNNEL RELEASE Left 1980's  . INSERT / REPLACE / REMOVE PACEMAKER  01/24/2013    Medtronic Adapta L dual-chamber pacemaker, serial number [id number]   . LEFT HEART CATHETERIZATION WITH CORONARY ANGIOGRAM N/A 03/17/2013   Procedure: LEFT HEART CATHETERIZATION WITH CORONARY ANGIOGRAM;  Surgeon: Burnell Blanks, MD; LAD mild dz, D1 branch 100%, inferior branch 99%, CFX OK, RCA 50%, EF 65%    . LOOP RECORDER IMPLANT  2002  . PERMANENT PACEMAKER INSERTION N/A 01/24/2013   Procedure: PERMANENT PACEMAKER INSERTION;  Surgeon: [name], MD;  Location: [facility];  Service: Cardiovascular;  Laterality: N/A;  . TIBIAL TUBERCLERPLASTY  ~ 2003    Home Medications:  Allergies as of 05/15/2018   No Known Allergies     Medication List       Accurate as of May 15, 2018 11:59 PM. Always use your most recent med list.        blood glucose meter kit and supplies Kit Dispense based on patient and insurance preference. Use up to four times daily as directed. (FOR ICD-9 250.00, 250.01).   buPROPion 150 MG 12 hr tablet Commonly known as:  ZYBAN TAKE 1 TABLET BY MOUTH TWICE A DAY   clopidogrel 75 MG tablet Commonly known as:  PLAVIX TAKE 1 TABLET BY MOUTH DAILY   escitalopram 10 MG tablet Commonly known as:  LEXAPRO TAKE 1 TABLET BY MOUTH EVERY DAY   fish oil-omega-3 fatty acids 1000 MG capsule Take 1 g by mouth daily.   fluticasone 50 MCG/ACT nasal spray Commonly known as:  FLONASE Place 1 spray into both nostrils daily as needed for allergies or rhinitis.   furosemide 20 MG tablet Commonly known as:  LASIX TAKE 1 TABLET BY MOUTH EVERY DAY   gabapentin 300 MG capsule Commonly known as:  NEURONTIN Take 1 capsule by mouth three times daily for diabetic  neuropathy.   glucose blood test strip Commonly known as:  ONE TOUCH ULTRA TEST USE TO TEST UP TO 4 TIMES DAILY   isosorbide mononitrate 30 MG 24 hr tablet Commonly known as:  IMDUR TAKE 1 TABLET BY MOUTH EVERY DAY   JARDIANCE 10 MG Tabs tablet Generic drug:  empagliflozin TAKE 1  TABLET BY MOUTH EVERY MORNING FOR DIABETES.   lisinopril 20 MG tablet Commonly known as:  PRINIVIL,ZESTRIL TAKE 1 TABLET BY MOUTH EVERY DAY   metFORMIN 500 MG tablet Commonly known as:  GLUCOPHAGE TAKE 1 TABLET BY MOUTH TWICE DAILY WITH A MEAL   metoprolol succinate 25 MG 24 hr tablet Commonly known as:  TOPROL XL Take 1 tablet (25 mg total) by mouth daily.   nitroGLYCERIN 0.4 MG SL tablet Commonly known as:  NITROSTAT Place 1 tablet (0.4 mg total) under the tongue every 5 (five) minutes as needed for chest pain.   pantoprazole 40 MG tablet Commonly known as:  PROTONIX TAKE 1 TABLET BY MOUTH ONCE DAILY   promethazine 25 MG tablet Commonly known as:  PHENERGAN Take 1 tablet (25 mg total) by mouth every 8 (eight) hours as needed for nausea or vomiting.   rosuvastatin 20 MG tablet Commonly known as:  CRESTOR Take 1 tablet by mouth every evening for cholesterol.   sitaGLIPtin 100 MG tablet Commonly known as:  JANUVIA Take 1 tablet by mouth once daily for diabetes.   tamsulosin 0.4 MG Caps capsule Commonly known as:  FLOMAX Take 1 capsule (0.4 mg total) by mouth daily.   traMADol 50 MG tablet Commonly known as:  ULTRAM Take 1 tablet (50 mg total) by mouth every 6 (six) hours as needed for severe pain. Caution of sedation   warfarin 1 MG tablet Commonly known as:  COUMADIN Take as directed by the anticoagulation clinic. If you are unsure how to take this medication, talk to your nurse or doctor. Original instructions:  TAKE 1 TABLET ON MONDAYS. PT. USES 1 MG TABLETS IN ADDITION TO 5 MG TABLETS.   warfarin 5 MG tablet Commonly known as:  COUMADIN Take as directed by the anticoagulation  clinic. If you are unsure how to take this medication, talk to your nurse or doctor. Original instructions:  TAKE 1 TABLET BY MOUTH ON MONDAYS AND 1/2 TABLET ALL OTHER DAYS       Allergies: No Known Allergies  Family History: Family History  Problem Relation Age of Onset  . Alzheimer's disease Mother   . Osteoporosis Mother     Social History:  reports that he quit smoking about 6 years ago. His smoking use included cigarettes. He has a 42.00 pack-year smoking history. He has never used smokeless tobacco. He reports previous alcohol use. He reports that he does not use drugs.  ROS: UROLOGY Frequent Urination?: No Hard to postpone urination?: No Burning/pain with urination?: No Get up at night to urinate?: No Leakage of urine?: No Urine stream starts and stops?: No Trouble starting stream?: No Do you have to strain to urinate?: No Blood in urine?: No Urinary tract infection?: No Sexually transmitted disease?: No Injury to kidneys or bladder?: No Painful intercourse?: No Weak stream?: No Erection problems?: No Penile pain?: No  Gastrointestinal Nausea?: No Vomiting?: No Indigestion/heartburn?: No Diarrhea?: No Constipation?: No  Constitutional Fever: No Night sweats?: No Weight loss?: No Fatigue?: No  Skin Skin rash/lesions?: No Itching?: No  Eyes Blurred vision?: No Double vision?: No  Ears/Nose/Throat Sore throat?: No Sinus problems?: No  Hematologic/Lymphatic Swollen glands?: No Easy bruising?: No  Cardiovascular Leg swelling?: No Chest pain?: No  Respiratory Cough?: No Shortness of breath?: No  Endocrine Excessive thirst?: No  Musculoskeletal Back pain?: No Joint pain?: No  Neurological Headaches?: No Dizziness?: No  Psychologic Depression?: No Anxiety?: No  Physical Exam: BP 115/73   Pulse 93  Ht '5\' 11"'$  (1.803 m)   Wt 230 lb (104.3 kg)   BMI 32.08 kg/m   Constitutional:  Alert and oriented, No acute distress. HEENT:  [place] AT, moist mucus membranes.  Trachea midline, no masses. Cardiovascular: No clubbing, cyanosis, or edema. Respiratory: Normal respiratory effort, no increased work of breathing. Skin: No rashes, bruises or suspicious lesions. Neurologic: Grossly intact, no focal deficits, moving all 4 extremities. Psychiatric: Normal mood and affect.  Slightly stoic.  Laboratory Data: Lab Results  Component Value Date   WBC 6.1 11/28/2017   HGB 14.7 11/28/2017   HCT 43.3 11/28/2017   MCV 97.0 11/28/2017   PLT 112 (L) 11/28/2017    Lab Results  Component Value Date   CREATININE 1.16 11/28/2017    Lab Results  Component Value Date   PSA 6.10 (H) 09/21/2017   PSA 4.33 (H) 10/27/2015   PSA 4.71 (H) 06/22/2014    Lab Results  Component Value Date   HGBA1C 6.4 (A) 04/02/2018    Assessment & Plan:    1. Prostate cancer [facility]) Newly diagnosed cT2 prostate cancer, high-volume intermediate risk involving right-sided gland only  The patient was counseled about the natural history of prostate cancer and the standard treatment options that are available for prostate cancer. It was explained to him how his age and life expectancy, clinical stage, Gleason score, and PSA affect his prognosis, the decision to proceed with additional staging studies, as well as how that information influences recommended treatment strategies. We discussed the roles for active surveillance, radiation therapy, surgical therapy, androgen deprivation, as well as ablative therapy options for the treatment of prostate cancer as appropriate to his individual cancer situation. We discussed the risks and benefits of these options with regard to their impact on cancer control and also in terms of potential adverse events, complications, and impact on quality of life particularly related to urinary, bowel, and sexual function. The patient was encouraged to ask questions throughout the discussion today and all questions were answered to  his stated satisfaction. In addition, the patient was providedwith and/or directed to appropriate resources and literature for further education about prostate cancer treatment options.  Given his extensive medical comorbidities primarily cardiac in nature, I do not feel that he is a good surgical candidate as he is higher risks.    I have recommended that he strongly consider XRT with 6 months of ADT.  We will go ahead and refer him to Dr. Noreene Filbert at the cancer center to further discussion.  I did offer him a surgical second opinion at an academic [facility] but he declined at this time.  We did go ahead discussed the role of ADT today as adjuvant treatment with radiation.  Given his intermediate risk disease, I recommend treatment with this for an additional 6 months.  We discussed the possible side effects of androgen deprivation therapy including hot flashes, central obesity, weight gain, loss of muscle mass, fatigue as well as cardiac issues.  All questions were answered.  He will likely pursue ADT and we will schedule him for this once timing of XRT is been arranged.  All of his questions were answered today.  - Ambulatory referral to Radiation Oncology  2. BPH with obstruction/lower urinary tract symptoms Moderate obstructive urinary symptoms Continue Flomax   Return for Lupron for 6 month injection (~ 1 month from now) then see MD in 6 months.  Hollice Espy, MD  [facility] [address], [place],  [place] [address] [phone]  

## 2018-05-19 NOTE — Progress Notes (Signed)
Dr. Frederico Hamman T. Perl Kerney, MD, [facility] [place], [address] Phone: [phone] Fax: [phone]  05/20/2018  Patient: [name] Cook, MRN: [id number], DOB: 08/20/1950, 68 y.o.  Primary Physician:  [name], NP   Chief Complaint  Patient presents with  . Knee Pain    Right   Subjective:   [name] Cook is a 68 y.o. very pleasant male patient who presents with the following:  I saw hiim last 12/10/2017 for L sided knee pain, felt to be most likely internal derangement. I did an intraarticular knee injection on him 10/29/2017.  On last exam, knee was doing well.  Joint spaces were well-preserved on 10/25/2017.  BP Readings from Last 3 Encounters:  05/20/18 (!) 77/49  05/15/18 115/73  05/10/18 110/70    Pain under kneecap and going up and down stairs.  No prior history.   His pain is primarily behind the kneecap.  Is not really having any medial lateral joint line pain.  Is not having any locking up of the joint or any other mechanical symptoms.  Range of motion is relatively preserved and does not have any kind of prior history such as fracture or surgery.  He has been taking some Tylenol daily as well as some tramadol as needed.  Past Medical History, Surgical History, Social History, Family History, Problem List, Medications, and Allergies have been reviewed and updated if relevant.  Patient Active Problem List   Diagnosis Date Noted  . Acute sinusitis 02/01/2018  . Left knee pain 10/25/2017  . OSA (obstructive sleep apnea) 10/01/2017  . Preventative health care 10/01/2017  . Osteoarthritis 06/26/2017  . Acute coronary syndrome ([place])   . Unstable angina ([place]) 10/14/2014  . Insomnia 10/01/2014  . Depression 03/09/2014  . Elevated PSA 02/09/2014  . Coronary artery disease 11/06/2013  . Long term current use of anticoagulant therapy 06/19/2013  . Pacemaker 04/30/2013  . Acute myocardial infarction,  subendocardial infarction, initial episode of care ([place]) 03/15/2013  . Hypokalemia 01/15/2013  . Abnormal LFTs 05/02/2012  . Chronic kidney disease, 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/26/2011  . Chronic obstructive pulmonary disease ([place]) 04/20/2011  . Atrial fibrillation   . Hypertension   . Hyperlipidemia   . Diabetes mellitus type 2, uncomplicated ([place])   . Atrial flutter ([place])   . Tobacco abuse   . Chronic anticoagulation 04/06/2011  . GASTROESOPHAGEAL REFLUX DISEASE 05/16/2010    Past Medical History:  Diagnosis Date  . Arthritis    "knees and lower back" (03/14/2013)  . Atrial flutter ([place])    radiofrequency ablation in 2001  . CAD (coronary artery disease)    a. Nonobstructive. Cardiac cath in 2001-50% mid RI, normal LM, LAD, RCA b. cath 10/16/2014 95% mid RCA treated with DES, 99% ost D1 medical management due to small aneurysmal segment  . Chronic anticoagulation    chronic Coumadin anticoagulation  . Chronic obstructive pulmonary disease ([place]) 04/20/2011  . Diabetes mellitus, type 2 ([place])   . GERD (gastroesophageal reflux disease)   . Hyperlipidemia   . Hypertension    with hypertensive heart disease  . Obesity   . Persistent atrial fibrillation    recurrent atrial flutter since 2001 s/p DCCVs, multiple failed AADs, h/o tachy-mediated cardiomyopathy  . Shortness of breath    "can come on at any time" (03/14/2013)  . Sleep apnea    "dx'd; couldn't wear the mask" (03/14/2013)  . Tobacco abuse  Past Surgical History:  Procedure Laterality Date  . ATRIAL FLUTTER ABLATION  2002   atrial flutter; subsequently developed atrial fibrillation  . AV NODE ABLATION  01/24/2013  . CARDIAC CATHETERIZATION  2002  . CARDIAC CATHETERIZATION N/A 10/16/2014   Procedure: Left Heart Cath and Coronary Angiography;  Surgeon: [name], MD;  Location: [facility];  Service: Cardiovascular;  Laterality: N/A;  . CARDIOVERSION  05/31/2011   Procedure: CARDIOVERSION;  Surgeon: Cristopher Estimable.  Lattie Haw, MD;  Location: [facility];  Service: Cardiovascular;  Laterality: N/A;  . CARPAL TUNNEL RELEASE Left 1980's  . INSERT / REPLACE / REMOVE PACEMAKER  01/24/2013    Medtronic Adapta L dual-chamber pacemaker, serial number [id number]   . LEFT HEART CATHETERIZATION WITH CORONARY ANGIOGRAM N/A 03/17/2013   Procedure: LEFT HEART CATHETERIZATION WITH CORONARY ANGIOGRAM;  Surgeon: Burnell Blanks, MD; LAD mild dz, D1 branch 100%, inferior branch 99%, CFX OK, RCA 50%, EF 65%    . LOOP RECORDER IMPLANT  2002  . PERMANENT PACEMAKER INSERTION N/A 01/24/2013   Procedure: PERMANENT PACEMAKER INSERTION;  Surgeon: [name], MD;  Location: [facility];  Service: Cardiovascular;  Laterality: N/A;  . TIBIAL TUBERCLERPLASTY  ~ 2003    Social History   Socioeconomic History  . Marital status: Legally Separated    Spouse name: Not on file  . Number of children: 1  . Years of education: Not on file  . Highest education level: Not on file  Occupational History  . Occupation: Merchandiser, retail: UNEMPLOYED  Social Needs  . Financial resource strain: Not on file  . Food insecurity:    Worry: Not on file    Inability: Not on file  . Transportation needs:    Medical: Not on file    Non-medical: Not on file  Tobacco Use  . Smoking status: Former Smoker    Packs/day: 1.00    Years: 42.00    Pack years: 42.00    Types: Cigarettes    Last attempt to quit: 12/31/2011    Years since quitting: 6.3  . Smokeless tobacco: Never Used  Substance and Sexual Activity  . Alcohol use: Not Currently    Comment: 03/14/2013 "stopped drinking back in 2002; never had problem w/it"  . Drug use: No  . Sexual activity: Not Currently  Lifestyle  . Physical activity:    Days per week: Not on file    Minutes per session: Not on file  . Stress: Not on file  Relationships  . Social connections:    Talks on phone: Not on file    Gets together: Not on file    Attends religious service: Not on file     Active member of club or organization: Not on file    Attends meetings of clubs or organizations: Not on file    Relationship status: Not on file  . Intimate partner violence:    Fear of current or ex partner: Not on file    Emotionally abused: Not on file    Physically abused: Not on file    Forced sexual activity: Not on file  Other Topics Concern  . Not on file  Social History Narrative   Single.   Retired.    1 son, deceased.    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   Enjoys playing on the computer.        Family History  Problem Relation Age of Onset  .  Alzheimer's disease Mother   . Osteoporosis Mother     No Known Allergies  Medication list reviewed and updated in full in [place].  GEN: No fevers, chills. Nontoxic. Primarily MSK c/o today. MSK: Detailed in the HPI GI: tolerating PO intake without difficulty Neuro: No numbness, parasthesias, or tingling associated. Otherwise the pertinent positives of the ROS are noted above.   Objective:   BP (!) 77/49   Pulse 90   Temp (!) 97.4 F (36.3 C) (Oral)   Ht '5\' 11"'  (1.803 m)   Wt 226 lb 4 oz (102.6 kg)   BMI 31.56 kg/m    GEN: WDWN, NAD, Non-toxic, Alert & Oriented x 3 HEENT: Atraumatic, Normocephalic.  Ears and Nose: No external deformity. EXTR: No clubbing/cyanosis/edema NEURO: Normal gait.  PSYCH: Normally interactive. Conversant. Not depressed or anxious appearing.  Calm demeanor.   Knee:  R Gait: Normal heel toe pattern ROM: 0-125 Effusion: neg Echymosis or edema: none Patellar tendon NT Painful PLICA: neg Patellar grind: pos Medial and lateral patellar facet loading: medial tender medial and lateral joint lines:NT Mcmurray's neg Flexion-pinch neg Varus and valgus stress: stable Lachman: neg Ant and Post drawer: neg Hip abduction, IR, ER: WNL Hip flexion str: 5/5 Hip abd: 5/5 Quad: 5/5 VMO atrophy:No Hamstring concentric and eccentric:  5/5   Radiology:   Assessment and Plan:   Right knee pain, unspecified chronicity  Atrial fibrillation  Type 2 diabetes mellitus without complication, without long-term current use of insulin (HCC)  Chronic kidney disease, stage II (mild)  Long term current use of anticoagulant therapy  Hypotension, unspecified hypotension type  Failure of other conservative treatment thus far including Tylenol and tramadol with persistent symptoms.  He does take Coumadin chronically, and NSAIDs are contraindicated.  With regards to his current hypertension today, he is asymptomatic.  He does have a blood pressure cuff at home, and I recommended that he take this daily.  Follow-up with primary care or cardiology if persistent.  Knee Injection, R Date of procedure: 05/20/2018 Patient verbally consented to procedure. Risks (including potential rare risk of infection), benefits, and alternatives explained. Sterilely prepped with Chloraprep. Ethyl cholride used for anesthesia. 8 cc Lidocaine 1% mixed with 2 mL Depo-Medrol 40 mg injected using the anteromedial approach without difficulty. No complications with procedure and tolerated well. Patient had decreased pain post-injection. Medication: 2 mL of Depo-Medrol 40 mg, equaling Depo-Medrol 80 mg total   Follow-up: No follow-ups on file.  Signed,  Maud Deed. Quiara Killian, MD   Outpatient Encounter Medications as of 05/20/2018  Medication Sig  . blood glucose meter kit and supplies KIT Dispense based on patient and insurance preference. Use up to four times daily as directed. (FOR ICD-9 250.00, 250.01).  Marland Kitchen buPROPion (ZYBAN) 150 MG 12 hr tablet TAKE 1 TABLET BY MOUTH TWICE A DAY  . clopidogrel (PLAVIX) 75 MG tablet TAKE 1 TABLET BY MOUTH DAILY  . escitalopram (LEXAPRO) 10 MG tablet TAKE 1 TABLET BY MOUTH EVERY DAY  . fish oil-omega-3 fatty acids 1000 MG capsule Take 1 g by mouth daily.    . fluticasone (FLONASE) 50 MCG/ACT nasal spray Place 1 spray into  both nostrils daily as needed for allergies or rhinitis.  . furosemide (LASIX) 20 MG tablet TAKE 1 TABLET BY MOUTH EVERY DAY  . gabapentin (NEURONTIN) 300 MG capsule Take 1 capsule by mouth three times daily for diabetic neuropathy.  Marland Kitchen glucose blood (ONE TOUCH ULTRA TEST) test strip USE TO TEST UP TO 4  TIMES DAILY  . isosorbide mononitrate (IMDUR) 30 MG 24 hr tablet TAKE 1 TABLET BY MOUTH EVERY DAY  . JARDIANCE 10 MG TABS tablet TAKE 1 TABLET BY MOUTH EVERY MORNING FOR DIABETES.  Marland Kitchen lisinopril (PRINIVIL,ZESTRIL) 20 MG tablet TAKE 1 TABLET BY MOUTH EVERY DAY  . metFORMIN (GLUCOPHAGE) 500 MG tablet TAKE 1 TABLET BY MOUTH TWICE DAILY WITH A MEAL  . metoprolol succinate (TOPROL XL) 25 MG 24 hr tablet Take 1 tablet (25 mg total) by mouth daily.  . nitroGLYCERIN (NITROSTAT) 0.4 MG SL tablet Place 1 tablet (0.4 mg total) under the tongue every 5 (five) minutes as needed for chest pain.  . pantoprazole (PROTONIX) 40 MG tablet TAKE 1 TABLET BY MOUTH ONCE DAILY  . promethazine (PHENERGAN) 25 MG tablet Take 1 tablet (25 mg total) by mouth every 8 (eight) hours as needed for nausea or vomiting.  . rosuvastatin (CRESTOR) 20 MG tablet Take 1 tablet by mouth every evening for cholesterol.  . sitaGLIPtin (JANUVIA) 100 MG tablet Take 1 tablet by mouth once daily for diabetes.  . tamsulosin (FLOMAX) 0.4 MG CAPS capsule Take 1 capsule (0.4 mg total) by mouth daily.  . traMADol (ULTRAM) 50 MG tablet Take 1 tablet (50 mg total) by mouth every 6 (six) hours as needed for severe pain. Caution of sedation  . warfarin (COUMADIN) 1 MG tablet TAKE 1 TABLET ON MONDAYS. PT. USES 1 MG TABLETS IN ADDITION TO 5 MG TABLETS.  Marland Kitchen warfarin (COUMADIN) 5 MG tablet TAKE 1 TABLET BY MOUTH ON MONDAYS AND 1/2 TABLET ALL OTHER DAYS   No facility-administered encounter medications on file as of 05/20/2018.

## 2018-05-20 ENCOUNTER — Ambulatory Visit (INDEPENDENT_AMBULATORY_CARE_PROVIDER_SITE_OTHER): Payer: Medicare Other | Admitting: Family Medicine

## 2018-05-20 ENCOUNTER — Encounter: Payer: Self-pay | Admitting: Family Medicine

## 2018-05-20 VITALS — BP 77/49 | HR 90 | Temp 97.4°F | Ht 71.0 in | Wt 226.2 lb

## 2018-05-20 DIAGNOSIS — M25561 Pain in right knee: Secondary | ICD-10-CM

## 2018-05-20 DIAGNOSIS — I959 Hypotension, unspecified: Secondary | ICD-10-CM

## 2018-05-20 DIAGNOSIS — I4819 Other persistent atrial fibrillation: Secondary | ICD-10-CM

## 2018-05-20 DIAGNOSIS — N182 Chronic kidney disease, stage 2 (mild): Secondary | ICD-10-CM

## 2018-05-20 DIAGNOSIS — E119 Type 2 diabetes mellitus without complications: Secondary | ICD-10-CM

## 2018-05-20 DIAGNOSIS — Z7901 Long term (current) use of anticoagulants: Secondary | ICD-10-CM

## 2018-05-20 MED ORDER — METHYLPREDNISOLONE ACETATE 40 MG/ML IJ SUSP
80.0000 mg | Freq: Once | INTRAMUSCULAR | Status: AC
  Administered 2018-05-20: 80 mg via INTRA_ARTICULAR

## 2018-05-20 NOTE — Addendum Note (Signed)
Addended by: Carter Kitten on: 05/20/2018 02:42 PM   Modules accepted: Orders

## 2018-05-21 NOTE — Progress Notes (Signed)
   Subjective: 68 year old male presenting today for follow up evaluation of a painful callus lesion of the left foot. He states he was seen by Dr. Milinda Pointer last week but is still experiencing pain. Walking and bearing weight increases the symptoms. He has not done anything at home for treatment. Patient is here for further evaluation and treatment.   Past Medical History:  Diagnosis Date  . Arthritis    "knees and lower back" (03/14/2013)  . Atrial flutter ([place])    radiofrequency ablation in 2001  . CAD (coronary artery disease)    a. Nonobstructive. Cardiac cath in 2001-50% mid RI, normal LM, LAD, RCA b. cath 10/16/2014 95% mid RCA treated with DES, 99% ost D1 medical management due to small aneurysmal segment  . Chronic anticoagulation    chronic Coumadin anticoagulation  . Chronic obstructive pulmonary disease ([place]) 04/20/2011  . Diabetes mellitus, type 2 ([place])   . GERD (gastroesophageal reflux disease)   . Hyperlipidemia   . Hypertension    with hypertensive heart disease  . Obesity   . Persistent atrial fibrillation    recurrent atrial flutter since 2001 s/p DCCVs, multiple failed AADs, h/o tachy-mediated cardiomyopathy  . Shortness of breath    "can come on at any time" (03/14/2013)  . Sleep apnea    "dx'd; couldn't wear the mask" (03/14/2013)  . Tobacco abuse      Objective:  Physical Exam General: Alert and oriented x3 in no acute distress  Dermatology: Hyperkeratotic lesion present on the left foot. Pain on palpation with a central nucleated core noted. Skin is warm, dry and supple bilateral lower extremities. Negative for open lesions or macerations.  Vascular: Palpable pedal pulses bilaterally. No edema or erythema noted. Capillary refill within normal limits.  Neurological: Epicritic and protective threshold grossly intact bilaterally.   Musculoskeletal Exam: Pain on palpation at the keratotic lesion noted as well as to the 2nd MPJ of the left foot. Range of motion  within normal limits bilateral. Muscle strength 5/5 in all groups bilateral.  Assessment: 1. 2nd MPJ capsulitis left 2. Porokeratosis left foot    Plan of Care:  1. Patient evaluated 2. Excisional debridement of keratoic lesion using a chisel blade was performed without incident. Salinocaine applied.  3. Dressed area with light dressing. 4. Injection of 0.5 mLs Celestone Soluspan injected into the 2nd MPJ of the left foot.  5. Recommended OTC corn and callus remover.  6. Offloading met pads dispensed.  7. Appointment with Liliane Channel, Pedorthist, on 05/22/2018 for DM shoes and insoles.  8. Patient is to return to the clinic PRN.   Edrick Kins, DPM [facility]  Dr. Edrick Kins, [address]                                        [place], [place] [address]                Office [phone]  Fax [phone]

## 2018-05-22 ENCOUNTER — Ambulatory Visit: Payer: Medicare Other | Admitting: Orthotics

## 2018-05-22 DIAGNOSIS — L989 Disorder of the skin and subcutaneous tissue, unspecified: Secondary | ICD-10-CM

## 2018-05-22 DIAGNOSIS — Q828 Other specified congenital malformations of skin: Secondary | ICD-10-CM

## 2018-05-22 NOTE — Progress Notes (Signed)
Patient not cast today b/c he is being seen by NP.  Advised him to get appointment with DO/MD and then we can process order.

## 2018-05-30 ENCOUNTER — Other Ambulatory Visit: Payer: Self-pay

## 2018-05-30 ENCOUNTER — Encounter: Payer: Self-pay | Admitting: Radiation Oncology

## 2018-05-30 ENCOUNTER — Ambulatory Visit
Admission: RE | Admit: 2018-05-30 | Discharge: 2018-05-30 | Disposition: A | Discharge status: Home / Self Care | Payer: Medicare Other | Source: Ambulatory Visit | Attending: Radiation Oncology | Admitting: Radiation Oncology

## 2018-05-30 VITALS — BP 108/74 | HR 90 | Temp 97.0°F | Resp 18 | Wt 224.4 lb

## 2018-05-30 DIAGNOSIS — E669 Obesity, unspecified: Secondary | ICD-10-CM | POA: Diagnosis not present

## 2018-05-30 DIAGNOSIS — M129 Arthropathy, unspecified: Secondary | ICD-10-CM | POA: Diagnosis not present

## 2018-05-30 DIAGNOSIS — Z7984 Long term (current) use of oral hypoglycemic drugs: Secondary | ICD-10-CM | POA: Diagnosis not present

## 2018-05-30 DIAGNOSIS — I10 Essential (primary) hypertension: Secondary | ICD-10-CM | POA: Diagnosis not present

## 2018-05-30 DIAGNOSIS — Z87891 Personal history of nicotine dependence: Secondary | ICD-10-CM | POA: Insufficient documentation

## 2018-05-30 DIAGNOSIS — G4730 Sleep apnea, unspecified: Secondary | ICD-10-CM | POA: Insufficient documentation

## 2018-05-30 DIAGNOSIS — E785 Hyperlipidemia, unspecified: Secondary | ICD-10-CM | POA: Diagnosis not present

## 2018-05-30 DIAGNOSIS — G629 Polyneuropathy, unspecified: Secondary | ICD-10-CM | POA: Diagnosis not present

## 2018-05-30 DIAGNOSIS — I2510 Atherosclerotic heart disease of native coronary artery without angina pectoris: Secondary | ICD-10-CM | POA: Insufficient documentation

## 2018-05-30 DIAGNOSIS — C61 Malignant neoplasm of prostate: Secondary | ICD-10-CM | POA: Insufficient documentation

## 2018-05-30 DIAGNOSIS — Z7901 Long term (current) use of anticoagulants: Secondary | ICD-10-CM | POA: Diagnosis not present

## 2018-05-30 DIAGNOSIS — Z79899 Other long term (current) drug therapy: Secondary | ICD-10-CM | POA: Diagnosis not present

## 2018-05-30 DIAGNOSIS — E119 Type 2 diabetes mellitus without complications: Secondary | ICD-10-CM | POA: Diagnosis not present

## 2018-05-30 DIAGNOSIS — Z923 Personal history of irradiation: Secondary | ICD-10-CM | POA: Insufficient documentation

## 2018-05-30 DIAGNOSIS — J449 Chronic obstructive pulmonary disease, unspecified: Secondary | ICD-10-CM | POA: Insufficient documentation

## 2018-05-30 DIAGNOSIS — I4891 Unspecified atrial fibrillation: Secondary | ICD-10-CM | POA: Insufficient documentation

## 2018-05-30 NOTE — Consult Note (Signed)
NEW PATIENT EVALUATION  Name: [name] Cook  MRN: [id number]  Date:   05/30/2018     DOB: 04-09-1951   This 68 y.o. 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  REFERRING PHYSICIAN: Hollice Espy, MD  CHIEF COMPLAINT:  Chief Complaint  Patient presents with  . Prostate Cancer    Initial Eval    DIAGNOSIS: The encounter diagnosis was Malignant neoplasm of prostate ([place]).   PREVIOUS INVESTIGATIONS:  Pathology reports reviewed Clinical notes reviewed cT scan of abdomen and pelvis reviewed  HPI: patient is a 68 year old male who presented with an elevated PSA of 6.1. on rectal exam had an abnormal prosthetic exam with a nodule in the right apex and firmness on the right side of the prostate. His prostate volume was 50 cc. He underwent transrectal ultrasound-guided biopsy showing 6 of 12 cores positive for adenocarcinoma with 3 Gleason 6 and 3 Gleason 7. Patient has some mild nocturia 2specific urgency or frequency. Patient has multiple medical comorbidities including CAD, COPD, adult-onset diabetes hypertension hyperlipidemia and neuropathy. He is on chronic anticoagulation therapy. He's been seen by urology and is referred to radiation oncology for opinion. He is not thought to be a surgical candidate for prostatectomy based on his multiple comorbidities.  PLANNED TREATMENT REGIMEN: image guided IM RT radiation therapy  PAST MEDICAL HISTORY:  has a past medical history of Arthritis, Atrial flutter ([place]), CAD (coronary artery disease), Chronic anticoagulation, Chronic obstructive pulmonary disease ([place]) (04/20/2011), Diabetes mellitus, type 2 ([place]), GERD (gastroesophageal reflux disease), Hyperlipidemia, Hypertension, Obesity, Persistent atrial fibrillation, Shortness of breath, Sleep apnea, and Tobacco abuse.    PAST SURGICAL HISTORY:  Past Surgical History:  Procedure Laterality  Date  . ATRIAL FLUTTER ABLATION  2002   atrial flutter; subsequently developed atrial fibrillation  . AV NODE ABLATION  01/24/2013  . CARDIAC CATHETERIZATION  2002  . CARDIAC CATHETERIZATION N/A 10/16/2014   Procedure: Left Heart Cath and Coronary Angiography;  Surgeon: [name], MD;  Location: [facility];  Service: Cardiovascular;  Laterality: N/A;  . CARDIOVERSION  05/31/2011   Procedure: CARDIOVERSION;  Surgeon: Cristopher Estimable. Lattie Haw, MD;  Location: [facility];  Service: Cardiovascular;  Laterality: N/A;  . CARPAL TUNNEL RELEASE Left 1980's  . INSERT / REPLACE / REMOVE PACEMAKER  01/24/2013    Medtronic Adapta L dual-chamber pacemaker, serial number [id number]   . LEFT HEART CATHETERIZATION WITH CORONARY ANGIOGRAM N/A 03/17/2013   Procedure: LEFT HEART CATHETERIZATION WITH CORONARY ANGIOGRAM;  Surgeon: Burnell Blanks, MD; LAD mild dz, D1 branch 100%, inferior branch 99%, CFX OK, RCA 50%, EF 65%    . LOOP RECORDER IMPLANT  2002  . PERMANENT PACEMAKER INSERTION N/A 01/24/2013   Procedure: PERMANENT PACEMAKER INSERTION;  Surgeon: [name], MD;  Location: [facility];  Service: Cardiovascular;  Laterality: N/A;  . TIBIAL TUBERCLERPLASTY  ~ 2003    FAMILY HISTORY: family history includes Alzheimer's disease in his mother; Osteoporosis in his mother.  SOCIAL HISTORY:  reports that he quit smoking about 6 years ago. His smoking use included cigarettes. He has a 42.00 pack-year smoking history. He has never used smokeless tobacco. He reports previous alcohol use. He reports that he does not use drugs.  ALLERGIES: Patient has no known allergies.  MEDICATIONS:  Current Outpatient Medications  Medication Sig Dispense Refill  . blood glucose meter kit and supplies KIT Dispense based on patient  and insurance preference. Use up to four times daily as directed. (FOR ICD-9 250.00, 250.01). 1 each 0  . buPROPion (ZYBAN) 150 MG 12 hr tablet TAKE 1 TABLET BY MOUTH TWICE A DAY 180  tablet 1  . clopidogrel (PLAVIX) 75 MG tablet TAKE 1 TABLET BY MOUTH DAILY 90 tablet 3  . escitalopram (LEXAPRO) 10 MG tablet TAKE 1 TABLET BY MOUTH EVERY DAY 90 tablet 1  . fish oil-omega-3 fatty acids 1000 MG capsule Take 1 g by mouth daily.      . fluticasone (FLONASE) 50 MCG/ACT nasal spray Place 1 spray into both nostrils daily as needed for allergies or rhinitis. 48 g 3  . furosemide (LASIX) 20 MG tablet TAKE 1 TABLET BY MOUTH EVERY DAY 90 tablet 2  . gabapentin (NEURONTIN) 300 MG capsule Take 1 capsule by mouth three times daily for diabetic neuropathy. 270 capsule 2  . glucose blood (ONE TOUCH ULTRA TEST) test strip USE TO TEST UP TO 4 TIMES DAILY 100 each 2  . isosorbide mononitrate (IMDUR) 30 MG 24 hr tablet TAKE 1 TABLET BY MOUTH EVERY DAY 90 tablet 1  . JARDIANCE 10 MG TABS tablet TAKE 1 TABLET BY MOUTH EVERY MORNING FOR DIABETES. 90 tablet 1  . lisinopril (PRINIVIL,ZESTRIL) 20 MG tablet TAKE 1 TABLET BY MOUTH EVERY DAY 90 tablet 1  . metFORMIN (GLUCOPHAGE) 500 MG tablet TAKE 1 TABLET BY MOUTH TWICE DAILY WITH A MEAL 180 tablet 1  . metoprolol succinate (TOPROL XL) 25 MG 24 hr tablet Take 1 tablet (25 mg total) by mouth daily. 90 tablet 3  . nitroGLYCERIN (NITROSTAT) 0.4 MG SL tablet Place 1 tablet (0.4 mg total) under the tongue every 5 (five) minutes as needed for chest pain. 25 tablet 0  . pantoprazole (PROTONIX) 40 MG tablet TAKE 1 TABLET BY MOUTH ONCE DAILY 90 tablet 3  . promethazine (PHENERGAN) 25 MG tablet Take 1 tablet (25 mg total) by mouth every 8 (eight) hours as needed for nausea or vomiting. 20 tablet 0  . rosuvastatin (CRESTOR) 20 MG tablet Take 1 tablet by mouth every evening for cholesterol. 90 tablet 2  . sitaGLIPtin (JANUVIA) 100 MG tablet Take 1 tablet by mouth once daily for diabetes. 90 tablet 2  . tamsulosin (FLOMAX) 0.4 MG CAPS capsule Take 1 capsule (0.4 mg total) by mouth daily. 30 capsule 11  . traMADol (ULTRAM) 50 MG tablet Take 1 tablet (50 mg total) by  mouth every 6 (six) hours as needed for severe pain. Caution of sedation 20 tablet 0  . warfarin (COUMADIN) 1 MG tablet TAKE 1 TABLET ON MONDAYS. PT. USES 1 MG TABLETS IN ADDITION TO 5 MG TABLETS. 90 tablet 0  . warfarin (COUMADIN) 5 MG tablet TAKE 1 TABLET BY MOUTH ON MONDAYS AND 1/2 TABLET ALL OTHER DAYS 48 tablet 7   No current facility-administered medications for this encounter.     ECOG PERFORMANCE STATUS:  0 - Asymptomatic  REVIEW OF SYSTEMS:  Patient denies any weight loss, fatigue, weakness, fever, chills or night sweats. Patient denies any loss of vision, blurred vision. Patient denies any ringing  of the ears or hearing loss. No irregular heartbeat. Patient denies heart murmur or history of fainting. Patient denies any chest pain or pain radiating to her upper extremities. Patient denies any shortness of breath, difficulty breathing at night, cough or hemoptysis. Patient denies any swelling in the lower legs. Patient denies any nausea vomiting, vomiting of blood, or coffee ground material in the vomitus.  Patient denies any stomach pain. Patient states has had normal bowel movements no significant constipation or diarrhea. Patient denies any dysuria, hematuria or significant nocturia. Patient denies any problems walking, swelling in the joints or loss of balance. Patient denies any skin changes, loss of hair or loss of weight. Patient denies any excessive worrying or anxiety or significant depression. Patient denies any problems with insomnia. Patient denies excessive thirst, polyuria, polydipsia. Patient denies any swollen glands, patient denies easy bruising or easy bleeding. Patient denies any recent infections, allergies or URI. Patient "s visual fields have not changed significantly in recent time.    PHYSICAL EXAM: BP 108/74   Pulse 90   Temp (!) 97 F (36.1 C)   Resp 18   Wt 224 lb 6.9 oz (101.8 kg)   BMI 31.30 kg/m  On rectal exam rectal sphincter tone is good. Right lobe of  the prostate has some slight firmness although the circumflex sulcus is preserved bilaterally.Well-developed well-nourished patient in NAD. HEENT reveals PERLA, EOMI, discs not visualized.  Oral cavity is clear. No oral mucosal lesions are identified. Neck is clear without evidence of cervical or supraclavicular adenopathy. Lungs are clear to A&P. Cardiac examination is essentially unremarkable with regular rate and rhythm without murmur rub or thrill. Abdomen is benign with no organomegaly or masses noted. Motor sensory and DTR levels are equal and symmetric in the upper and lower extremities. Cranial nerves II through XII are grossly intact. Proprioception is intact. No peripheral adenopathy or edema is identified. No motor or sensory levels are noted. Crude visual fields are within normal range.  LABORATORY DATA: pathology reports reviewed    RADIOLOGY RESULTS:CT scan from August 2019 reviewed showing evidence to suggest pelvic adenopathy or extracapsular extension.   IMPRESSION: stage IIa Gleason 7 (3+4) adenocarcinoma prostate presenting with a PSA of 44.12 in 68 year old male  PLAN: at this time based on his multiple medical comorbidities I agree with deferring surgery. I would recommend image guided I MRT radiation therapy up to 8000 cGy to his prostate and proximal seminal vesicle. I have asked Dr. Erlene Quan to place fiduciary markers in the prostate for daily image guided treatment. Risks and benefits of treatment including increased lower urinary tract symptoms diarrhea fatigue alteration of blood counts and skin reaction all were discussed in detail with the patient. Patient seems to comprehend my treatment plan well. Patient also will be on ADT therapy through Dr. Cherrie Gauze office and that is already been arranged.  I would like to take this opportunity to thank you for allowing me to participate in the care of your patient.Noreene Filbert, MD

## 2018-05-31 ENCOUNTER — Telehealth: Payer: Self-pay | Admitting: Urology

## 2018-05-31 ENCOUNTER — Telehealth: Payer: Self-pay | Admitting: Internal Medicine

## 2018-05-31 NOTE — Telephone Encounter (Signed)
-----   Message from Daiva Huge, RN sent at 05/30/2018  2:16 PM EST ----- Regarding: Gold seed marker placement We need to get [name] Cook scheduled for Gold seed marker placement.  He has the markers.  Please just let us know so we can get his simulation appt. Scheduled.    Thanks,   [facility]

## 2018-05-31 NOTE — Telephone Encounter (Signed)
   Primary Cardiologist: [name], MD  Chart reviewed as part of pre-operative protocol coverage. Provide has requested pharmacy clearance for his coumadin, not medical clearance.   Per our pharmacy staff: Patient takes warfarin for afib with CHADS2VASc score of 4 (age, HTN, DM, CAD). Recommend holding warfarin for 5 days prior to procedure, he does not need a 7 day hold to ensure complete washout.   I will route this recommendation to the requesting party via Epic fax function and remove from pre-op pool.  Please call with questions.  Tami Lin Duke, PA 05/31/2018, 3:10 PM

## 2018-05-31 NOTE — Telephone Encounter (Signed)
I have requested medical clearance for the patient to stop his warfarin 7 days prior to the gold seed on 06-18-18 from Dr. Hillery Jacks office. [phone] They will let me know if he is able to stop it today or Monday.   Sharyn Lull

## 2018-05-31 NOTE — Telephone Encounter (Signed)
Patient takes warfarin for afib with CHADS2VASc score of 4 (age, HTN, DM, CAD). Recommend holding warfarin for 5 days prior to procedure, he does not need a 7 day hold to ensure complete washout.

## 2018-05-31 NOTE — Telephone Encounter (Signed)
° °  [facility] Pre-operative Risk Assessment    Request for surgical clearance:  1. What type of surgery is being performed? Gold Seed Marker Placed  2. When is this surgery scheduled? 06-18-18   3. What type of clearance is required (medical clearance vs. Pharmacy clearance to hold med vs. Both)? Pharmacy  4. Are there any medications that need to be held prior to surgery and how long? Can pt stop his  Warfarin 7 days prior to procedure 5.  6. Practice name and name of physician performing surgery? Dr Hollice Espy   7. What is your office phone number  [phone]    7.   What is your office fax number [phone]  8.   Anesthesia type (None, local, MAC, general) ? Local   Glyn Ade 05/31/2018, 9:17 AM  _________________________________________________________________   (provider comments below)

## 2018-06-02 ENCOUNTER — Other Ambulatory Visit: Payer: Self-pay | Admitting: Internal Medicine

## 2018-06-03 ENCOUNTER — Telehealth: Payer: Self-pay | Admitting: Urology

## 2018-06-03 NOTE — Telephone Encounter (Signed)
Pt call office inquiring about his upcoming gold seed marker appt. States someone was supposed to call him and mail him pre instructions before his appt. Please advise pt at [phone]

## 2018-06-03 NOTE — Telephone Encounter (Signed)
LMOM informing patient the instructions have been sent in the mail. He is to call our office if he has more questions.

## 2018-06-04 ENCOUNTER — Encounter (HOSPITAL_COMMUNITY): Payer: Self-pay

## 2018-06-04 ENCOUNTER — Other Ambulatory Visit: Payer: Self-pay

## 2018-06-04 ENCOUNTER — Telehealth: Payer: Self-pay

## 2018-06-04 ENCOUNTER — Observation Stay (HOSPITAL_COMMUNITY)
Admission: EM | Admit: 2018-06-04 | Discharge: 2018-06-07 | Disposition: A | Discharge status: Home / Self Care | Payer: Medicare Other | Attending: Internal Medicine | Admitting: Internal Medicine

## 2018-06-04 ENCOUNTER — Emergency Department (HOSPITAL_COMMUNITY): Payer: Medicare Other

## 2018-06-04 DIAGNOSIS — E785 Hyperlipidemia, unspecified: Secondary | ICD-10-CM | POA: Diagnosis not present

## 2018-06-04 DIAGNOSIS — Z7901 Long term (current) use of anticoagulants: Secondary | ICD-10-CM | POA: Diagnosis not present

## 2018-06-04 DIAGNOSIS — Z79899 Other long term (current) drug therapy: Secondary | ICD-10-CM | POA: Insufficient documentation

## 2018-06-04 DIAGNOSIS — Z7951 Long term (current) use of inhaled steroids: Secondary | ICD-10-CM | POA: Insufficient documentation

## 2018-06-04 DIAGNOSIS — K219 Gastro-esophageal reflux disease without esophagitis: Secondary | ICD-10-CM | POA: Diagnosis not present

## 2018-06-04 DIAGNOSIS — C61 Malignant neoplasm of prostate: Secondary | ICD-10-CM | POA: Diagnosis present

## 2018-06-04 DIAGNOSIS — Z6830 Body mass index (BMI) 30.0-30.9, adult: Secondary | ICD-10-CM | POA: Insufficient documentation

## 2018-06-04 DIAGNOSIS — E119 Type 2 diabetes mellitus without complications: Secondary | ICD-10-CM

## 2018-06-04 DIAGNOSIS — I2510 Atherosclerotic heart disease of native coronary artery without angina pectoris: Secondary | ICD-10-CM | POA: Diagnosis not present

## 2018-06-04 DIAGNOSIS — E1142 Type 2 diabetes mellitus with diabetic polyneuropathy: Secondary | ICD-10-CM | POA: Insufficient documentation

## 2018-06-04 DIAGNOSIS — Z7902 Long term (current) use of antithrombotics/antiplatelets: Secondary | ICD-10-CM | POA: Diagnosis not present

## 2018-06-04 DIAGNOSIS — Z955 Presence of coronary angioplasty implant and graft: Secondary | ICD-10-CM | POA: Insufficient documentation

## 2018-06-04 DIAGNOSIS — Z8546 Personal history of malignant neoplasm of prostate: Secondary | ICD-10-CM | POA: Diagnosis present

## 2018-06-04 DIAGNOSIS — N182 Chronic kidney disease, stage 2 (mild): Secondary | ICD-10-CM | POA: Diagnosis not present

## 2018-06-04 DIAGNOSIS — I959 Hypotension, unspecified: Secondary | ICD-10-CM

## 2018-06-04 DIAGNOSIS — E669 Obesity, unspecified: Secondary | ICD-10-CM | POA: Diagnosis not present

## 2018-06-04 DIAGNOSIS — I4892 Unspecified atrial flutter: Secondary | ICD-10-CM | POA: Diagnosis not present

## 2018-06-04 DIAGNOSIS — F329 Major depressive disorder, single episode, unspecified: Secondary | ICD-10-CM | POA: Insufficient documentation

## 2018-06-04 DIAGNOSIS — E1122 Type 2 diabetes mellitus with diabetic chronic kidney disease: Secondary | ICD-10-CM | POA: Diagnosis not present

## 2018-06-04 DIAGNOSIS — I700 Atherosclerosis of aorta: Secondary | ICD-10-CM | POA: Insufficient documentation

## 2018-06-04 DIAGNOSIS — I5022 Chronic systolic (congestive) heart failure: Secondary | ICD-10-CM | POA: Diagnosis not present

## 2018-06-04 DIAGNOSIS — E1165 Type 2 diabetes mellitus with hyperglycemia: Secondary | ICD-10-CM

## 2018-06-04 DIAGNOSIS — Z7984 Long term (current) use of oral hypoglycemic drugs: Secondary | ICD-10-CM | POA: Insufficient documentation

## 2018-06-04 DIAGNOSIS — J449 Chronic obstructive pulmonary disease, unspecified: Secondary | ICD-10-CM | POA: Diagnosis not present

## 2018-06-04 DIAGNOSIS — I130 Hypertensive heart and chronic kidney disease with heart failure and stage 1 through stage 4 chronic kidney disease, or unspecified chronic kidney disease: Secondary | ICD-10-CM | POA: Insufficient documentation

## 2018-06-04 DIAGNOSIS — I10 Essential (primary) hypertension: Secondary | ICD-10-CM | POA: Diagnosis present

## 2018-06-04 DIAGNOSIS — Z87891 Personal history of nicotine dependence: Secondary | ICD-10-CM | POA: Diagnosis not present

## 2018-06-04 DIAGNOSIS — I480 Paroxysmal atrial fibrillation: Secondary | ICD-10-CM | POA: Insufficient documentation

## 2018-06-04 DIAGNOSIS — Z950 Presence of cardiac pacemaker: Secondary | ICD-10-CM | POA: Diagnosis present

## 2018-06-04 DIAGNOSIS — G4733 Obstructive sleep apnea (adult) (pediatric): Secondary | ICD-10-CM | POA: Insufficient documentation

## 2018-06-04 DIAGNOSIS — I4819 Other persistent atrial fibrillation: Secondary | ICD-10-CM | POA: Diagnosis present

## 2018-06-04 DIAGNOSIS — R079 Chest pain, unspecified: Secondary | ICD-10-CM | POA: Diagnosis not present

## 2018-06-04 DIAGNOSIS — I252 Old myocardial infarction: Secondary | ICD-10-CM | POA: Insufficient documentation

## 2018-06-04 LAB — HEPATIC FUNCTION PANEL
ALT: 14 U/L (ref 0–44)
AST: 18 U/L (ref 15–41)
Albumin: 3.2 g/dL — ABNORMAL LOW (ref 3.5–5.0)
Alkaline Phosphatase: 43 U/L (ref 38–126)
Bilirubin, Direct: 0.2 mg/dL (ref 0.0–0.2)
Indirect Bilirubin: 0.9 mg/dL (ref 0.3–0.9)
Total Bilirubin: 1.1 mg/dL (ref 0.3–1.2)
Total Protein: 6 g/dL — ABNORMAL LOW (ref 6.5–8.1)

## 2018-06-04 LAB — I-STAT TROPONIN, ED: Troponin i, poc: 0 ng/mL (ref 0.00–0.08)

## 2018-06-04 LAB — BASIC METABOLIC PANEL
Anion gap: 13 (ref 5–15)
BUN: 18 mg/dL (ref 8–23)
CO2: 22 mmol/L (ref 22–32)
Calcium: 8.7 mg/dL — ABNORMAL LOW (ref 8.9–10.3)
Chloride: 104 mmol/L (ref 98–111)
Creatinine, Ser: 1.23 mg/dL (ref 0.61–1.24)
GFR calc Af Amer: >60 mL/min (ref >60)
GFR calc non Af Amer: >60 mL/min (ref >60)
Glucose, Bld: 177 mg/dL — ABNORMAL HIGH (ref 70–99)
Potassium: 4 mmol/L (ref 3.5–5.1)
Sodium: 139 mmol/L (ref 135–145)

## 2018-06-04 LAB — PROTIME-INR
INR: 2.75
Prothrombin Time: 28.7 seconds — ABNORMAL HIGH (ref 11.4–15.2)

## 2018-06-04 LAB — CBC
HCT: 44.3 % (ref 39.0–52.0)
Hemoglobin: 14.3 g/dL (ref 13.0–17.0)
MCH: 31.8 pg (ref 26.0–34.0)
MCHC: 32.3 g/dL (ref 30.0–36.0)
MCV: 98.7 fL (ref 80.0–100.0)
Platelets: DECREASED 10*3/uL (ref 150–400)
RBC: 4.49 MIL/uL (ref 4.22–5.81)
RDW: 13.8 % (ref 11.5–15.5)
WBC: 8.2 10*3/uL (ref 4.0–10.5)
nRBC: 0 % (ref 0.0–0.2)

## 2018-06-04 MED ORDER — IOPAMIDOL (ISOVUE-370) INJECTION 76%
100.0000 mL | Freq: Once | INTRAVENOUS | Status: AC | PRN
  Administered 2018-06-04: 100 mL via INTRAVENOUS

## 2018-06-04 MED ORDER — SODIUM CHLORIDE 0.9% FLUSH
3.0000 mL | Freq: Once | INTRAVENOUS | Status: AC
  Administered 2018-06-04: 3 mL via INTRAVENOUS

## 2018-06-04 MED ORDER — SODIUM CHLORIDE 0.9 % IV BOLUS
500.0000 mL | Freq: Once | INTRAVENOUS | Status: AC
  Administered 2018-06-04: 500 mL via INTRAVENOUS

## 2018-06-04 NOTE — Telephone Encounter (Signed)
Pt said for 2 - 3 days pt has had weakness and dizziness which comes and goes; pt had H/A this morning when he woke up but H/A went away on its own and pt did not take any med to get rid of H/A.SOB worse for last 2 days, any exertion such as walking to bathroom pt gives out of breath. Pt not having any problems walking  But lt hand goes numb on and off. No numbness now. No vision changes and no CP. I asked pt what his BS had been running. Pt does not know; last checked BS about 1 wk ago. Pt is aware he is supposed to ck BS qid. I asked pt if he could ck BS now and he said not now. Pt will go to [facility] ED. Pt will have someone drive him since he is dizzy.FYI to Gentry Fitz NP.

## 2018-06-04 NOTE — ED Provider Notes (Signed)
[facility] EMERGENCY DEPARTMENT Provider Note   CSN: [id number] Arrival date & time: 06/04/18  1744    History   Chief Complaint Chief Complaint  Patient presents with  . Chest Pain    HPI [name] Cook is a 68 y.o. male who presents with dizziness and chest pain.  Past medical history significant for coronary artery disease, atrial flutter on Coumadin, diabetes, recent diagnosis of prostate cancer. He is somewhat of a vague historian. The patient states that for the past 3 days he has had intermittent dizziness and chest pain.  States the dizziness is worse when he bends over to pick up something.  It feels like he is going to pass out.  He denies any syncopal episodes.  He also has sharp substernal and right-sided chest pain which is worse with worse with deep inspirations.  This is also intermittent.  He states he feels weak all over and his arms and legs feel heavy.  He also had a pounding headache last night which has improved.  He denies fever or any infectious symptoms.  He denies shortness of breath, cough, abdominal pain, nausea, vomiting, urinary symptoms.  He has had diarrhea for the past 1 month.  He states that he is probably having about 5 episodes a day which are nonbloody.  Denies any recent antibiotic use or travel.  He has not had any change in his medicine regimen recently.  He states it does not feel similar as to when he has had to have stents put in.  At that time he was asymptomatic and had a catheterization due to an abnormal stress test. He was recently diagnosed with prostate cancer and plans to undergo radiation soon. When EMS arrived today he was noted to be hypotensive and he was given 400cc bolus and ASA.  HPI  Past Medical History:  Diagnosis Date  . Arthritis    "knees and lower back" (03/14/2013)  . Atrial flutter ([place])    radiofrequency ablation in 2001  . CAD (coronary artery disease)    a. Nonobstructive. Cardiac cath in 2001-50% mid RI,  normal LM, LAD, RCA b. cath 10/16/2014 95% mid RCA treated with DES, 99% ost D1 medical management due to small aneurysmal segment  . Chronic anticoagulation    chronic Coumadin anticoagulation  . Chronic obstructive pulmonary disease ([place]) 04/20/2011  . Diabetes mellitus, type 2 ([place])   . GERD (gastroesophageal reflux disease)   . Hyperlipidemia   . Hypertension    with hypertensive heart disease  . Obesity   . Persistent atrial fibrillation    recurrent atrial flutter since 2001 s/p DCCVs, multiple failed AADs, h/o tachy-mediated cardiomyopathy  . Shortness of breath    "can come on at any time" (03/14/2013)  . Sleep apnea    "dx'd; couldn't wear the mask" (03/14/2013)  . Tobacco abuse     Patient Active Problem List   Diagnosis Date Noted  . Acute sinusitis 02/01/2018  . Left knee pain 10/25/2017  . OSA (obstructive sleep apnea) 10/01/2017  . Preventative health care 10/01/2017  . Osteoarthritis 06/26/2017  . Acute coronary syndrome ([place])   . Unstable angina ([place]) 10/14/2014  . Insomnia 10/01/2014  . Depression 03/09/2014  . Elevated PSA 02/09/2014  . Coronary artery disease 11/06/2013  . Long term current use of anticoagulant therapy 06/19/2013  . Pacemaker 04/30/2013  . Acute myocardial infarction, subendocardial infarction, initial episode of care ([place]) 03/15/2013  . Hypokalemia 01/15/2013  . Abnormal LFTs 05/02/2012  .  Chronic kidney disease, 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/26/2011  . Chronic obstructive pulmonary disease ([place]) 04/20/2011  . Atrial fibrillation   . Hypertension   . Hyperlipidemia   . Diabetes mellitus type 2, uncomplicated ([place])   . Atrial flutter ([place])   . Tobacco abuse   . Chronic anticoagulation 04/06/2011  . GASTROESOPHAGEAL REFLUX DISEASE 05/16/2010    Past Surgical History:  Procedure Laterality Date  . ATRIAL FLUTTER ABLATION  2002   atrial flutter; subsequently developed atrial fibrillation  . AV NODE ABLATION  01/24/2013  . CARDIAC CATHETERIZATION   2002  . CARDIAC CATHETERIZATION N/A 10/16/2014   Procedure: Left Heart Cath and Coronary Angiography;  Surgeon: [name], MD;  Location: [facility];  Service: Cardiovascular;  Laterality: N/A;  . CARDIOVERSION  05/31/2011   Procedure: CARDIOVERSION;  Surgeon: Cristopher Estimable. Lattie Haw, MD;  Location: [facility];  Service: Cardiovascular;  Laterality: N/A;  . CARPAL TUNNEL RELEASE Left 1980's  . INSERT / REPLACE / REMOVE PACEMAKER  01/24/2013    Medtronic Adapta L dual-chamber pacemaker, serial number [id number]   . LEFT HEART CATHETERIZATION WITH CORONARY ANGIOGRAM N/A 03/17/2013   Procedure: LEFT HEART CATHETERIZATION WITH CORONARY ANGIOGRAM;  Surgeon: Burnell Blanks, MD; LAD mild dz, D1 branch 100%, inferior branch 99%, CFX OK, RCA 50%, EF 65%    . LOOP RECORDER IMPLANT  2002  . PERMANENT PACEMAKER INSERTION N/A 01/24/2013   Procedure: PERMANENT PACEMAKER INSERTION;  Surgeon: [name], MD;  [facility] C[facility];  Service: Cardiovascular;  Laterality: N/A;  . TIBIAL TUBERCLERPLASTY  ~ 2003        Home Medications    Prior to Admission medications   Medication Sig Start Date End Date Taking? Authorizing Provider  blood glucose meter kit and supplies KIT Dispense based on patient and insurance preference. Use up to four times daily as directed. (FOR ICD-9 250.00, 250.01). 10/01/17   [name], NP  buPROPion (ZYBAN) 150 MG 12 hr tablet TAKE 1 TABLET BY MOUTH TWICE A DAY 04/22/18   [name], NP  clopidogrel (PLAVIX) 75 MG tablet TAKE 1 TABLET BY MOUTH EVERY DAY 06/04/18   [name], MD  escitalopram (LEXAPRO) 10 MG tablet TAKE 1 TABLET BY MOUTH EVERY DAY 04/22/18   [name], NP  fish oil-omega-3 fatty acids 1000 MG capsule Take 1 g by mouth daily.      [provider]  fluticasone (FLONASE) 50 MCG/ACT nasal spray Place 1 spray into both nostrils daily as needed for allergies or rhinitis. 03/19/18   Alycia Rossetti, MD  furosemide  (LASIX) 20 MG tablet TAKE 1 TABLET BY MOUTH EVERY DAY 01/17/18   Dena Billet B, PA-C  gabapentin (NEURONTIN) 300 MG capsule Take 1 capsule by mouth three times daily for diabetic neuropathy. 12/12/17   [name], NP  glucose blood (ONE TOUCH ULTRA TEST) test strip USE TO TEST UP TO 4 TIMES DAILY 10/23/17   [name], NP  isosorbide mononitrate (IMDUR) 30 MG 24 hr tablet TAKE 1 TABLET BY MOUTH EVERY DAY 01/10/18   [name], NP  JARDIANCE 10 MG TABS tablet TAKE 1 TABLET BY MOUTH EVERY MORNING FOR DIABETES. 05/14/18   [name], NP  lisinopril (PRINIVIL,ZESTRIL) 20 MG tablet TAKE 1 TABLET BY MOUTH EVERY DAY 10/12/17   Alycia Rossetti, MD  metFORMIN (GLUCOPHAGE) 500 MG tablet TAKE 1 TABLET BY MOUTH TWICE DAILY WITH A MEAL 04/08/18   Bulpitt, Advance,  MD  metoprolol succinate (TOPROL XL) 25 MG 24 hr tablet Take 1 tablet (25 mg total) by mouth daily. 05/10/18   [name], MD  nitroGLYCERIN (NITROSTAT) 0.4 MG SL tablet Place 1 tablet (0.4 mg total) under the tongue every 5 (five) minutes as needed for chest pain. 01/23/18   [name], NP  pantoprazole (PROTONIX) 40 MG tablet TAKE 1 TABLET BY MOUTH ONCE DAILY 07/11/17   Orlena Sheldon, PA-C  promethazine (PHENERGAN) 25 MG tablet Take 1 tablet (25 mg total) by mouth every 8 (eight) hours as needed for nausea or vomiting. 03/19/18   Alycia Rossetti, MD  rosuvastatin (CRESTOR) 20 MG tablet Take 1 tablet by mouth every evening for cholesterol. 12/12/17   [name], NP  sitaGLIPtin (JANUVIA) 100 MG tablet Take 1 tablet by mouth once daily for diabetes. 01/23/18   [name], NP  tamsulosin (FLOMAX) 0.4 MG CAPS capsule Take 1 capsule (0.4 mg total) by mouth daily. 11/20/17   Hollice Espy, MD  traMADol (ULTRAM) 50 MG tablet Take 1 tablet (50 mg total) by mouth every 6 (six) hours as needed for severe pain. Caution of sedation 11/01/17   Copland, Frederico Hamman, MD  warfarin (COUMADIN) 1 MG tablet TAKE 1 TABLET ON  MONDAYS. PT. USES 1 MG TABLETS IN ADDITION TO 5 MG TABLETS. 08/08/17   [name], NP  warfarin (COUMADIN) 5 MG tablet TAKE 1 TABLET BY MOUTH ON MONDAYS AND 1/2 TABLET ALL OTHER DAYS 03/04/18   Susy Frizzle, MD    Family History Family History  Problem Relation Age of Onset  . Alzheimer's disease Mother   . Osteoporosis Mother     Social History Social History   Tobacco Use  . Smoking status: Former Smoker    Packs/day: 1.00    Years: 42.00    Pack years: 42.00    Types: Cigarettes    Last attempt to quit: 12/31/2011    Years since quitting: 6.4  . Smokeless tobacco: Never Used  Substance Use Topics  . Alcohol use: Not Currently    Comment: 03/14/2013 "stopped drinking back in 2002; never had problem w/it"  . Drug use: No     Allergies   Patient has no known allergies.   Review of Systems Review of Systems  Constitutional: Positive for fatigue. Negative for fever.  Respiratory: Negative for cough and shortness of breath.   Cardiovascular: Positive for chest pain. Negative for palpitations and leg swelling.  Gastrointestinal: Positive for diarrhea. Negative for abdominal pain, blood in stool, nausea and vomiting.  Genitourinary: Negative for difficulty urinating and dysuria.  Musculoskeletal: Negative for back pain.  Neurological: Positive for weakness, light-headedness and headaches. Negative for syncope.  Hematological: Bruises/bleeds easily.  All other systems reviewed and are negative.    Physical Exam Updated Vital Signs BP 95/65   Pulse 72   Temp (!) 97.3 F (36.3 C) (Oral)   Resp 20   Ht _0  (1.803 m)   Wt 99.8 kg   SpO2 96%   BMI 30.68 kg/m   Physical Exam Vitals signs and nursing note reviewed.  Constitutional:      General: He is not in acute distress.    Appearance: He is well-developed. He is obese. He is not ill-appearing.     Comments: Calm, cooperative. Pale appearing  HENT:     Head: Normocephalic and atraumatic.      Right Ear: Tympanic membrane normal.     Left Ear: Tympanic  membrane normal.     Nose: Nose normal.     Mouth/Throat:     Lips: Pink.     Mouth: Mucous membranes are pale.     Dentition: Has dentures.     Pharynx: Oropharynx is clear.  Eyes:     General: No scleral icterus.       Right eye: No discharge.        Left eye: No discharge.     Conjunctiva/sclera: Conjunctivae normal.     Pupils: Pupils are equal, round, and reactive to light.  Neck:     Musculoskeletal: Normal range of motion and neck supple.  Cardiovascular:     Rate and Rhythm: Normal rate and regular rhythm.  Pulmonary:     Effort: Pulmonary effort is normal. No respiratory distress.     Breath sounds: Normal breath sounds.  Abdominal:     General: Bowel sounds are normal. There is no distension.     Palpations: Abdomen is soft.     Tenderness: There is no abdominal tenderness.  Musculoskeletal:     Right lower leg: No edema.     Left lower leg: No edema.  Skin:    General: Skin is warm and dry.  Neurological:     Mental Status: He is alert and oriented to person, place, and time.  Psychiatric:        Mood and Affect: Mood normal.        Behavior: Behavior normal.      ED Treatments / Results  Labs (all labs ordered are listed, but only abnormal results are displayed) Labs Reviewed  BASIC METABOLIC PANEL - Abnormal; Notable for the following components:      Result Value   Glucose, Bld 177 (*)    Calcium 8.7 (*)    All other components within normal limits  HEPATIC FUNCTION PANEL - Abnormal; Notable for the following components:   Total Protein 6.0 (*)    Albumin 3.2 (*)    All other components within normal limits  PROTIME-INR - Abnormal; Notable for the following components:   Prothrombin Time 28.7 (*)    All other components within normal limits  GASTROINTESTINAL PANEL BY PCR, STOOL (REPLACES STOOL CULTURE)  C DIFFICILE QUICK SCREEN W PCR REFLEX  CBC  I-STAT TROPONIN, ED    EKG EKG  Interpretation  Date/Time:  Tuesday June 04 2018 17:55:12 EST Ventricular Rate:  74 PR Interval:    QRS Duration: 178 QT Interval:  499 QTC Calculation: 554 R Axis:   -76 Text Interpretation:  no change in QRS  or T wave morphology as compared to previous paced tracing.  pacer spikes no prominent as previous but complexes appear paces. Confirmed by Charlesetta Shanks [phone]) on 06/04/2018 6:07:20 PM   Radiology Dg Chest 2 View  Result Date: 06/04/2018 CLINICAL DATA:  Chest pain EXAM: CHEST - 2 VIEW COMPARISON:  03/10/2016 FINDINGS: Dual lead pacemaker unchanged. Negative for heart failure. Lungs clear without infiltrate or effusion. IMPRESSION: No active cardiopulmonary disease. Electronically Signed   By: Franchot Gallo M.D.   On: 06/04/2018 19:08   Ct Angio Chest Pe W And/or Wo Contrast  Result Date: 06/04/2018 CLINICAL DATA:  Left-sided chest pain beginning last night radiating to the left arm. EXAM: CT ANGIOGRAPHY CHEST WITH CONTRAST TECHNIQUE: Multidetector CT imaging of the chest was performed using the standard protocol during bolus administration of intravenous contrast. Multiplanar CT image reconstructions and MIPs were obtained to evaluate the vascular anatomy. CONTRAST:  126m ISOVUE-370  IOPAMIDOL (ISOVUE-370) INJECTION 76% COMPARISON:  CXR 06/04/2018 FINDINGS: Cardiovascular: Satisfactory opacification of the pulmonary arteries to the segmental level. No evidence of pulmonary embolism. Normal heart size. No pericardial effusion. Aortic atherosclerosis without aneurysm. Atherosclerosis of the great vessels without acute abnormality. Left-sided pacemaker apparatus with leads in the right atrium right ventricle are identified, best visualized on the scout view. Mediastinum/Nodes: No enlarged mediastinal, hilar, or axillary lymph nodes. Thyroid gland, trachea, and esophagus demonstrate no significant findings. Lungs/Pleura: Passive atelectasis and subsegmental atelectasis, greatest at the  left lung base. A few tiny subpleural blebs are noted at the right lung apex. No pneumothorax or dominant mass. No focal pulmonary consolidation. Upper Abdomen: No acute abnormality. Musculoskeletal: No chest wall abnormality. No acute or significant osseous findings. Review of the MIP images confirms the above findings. IMPRESSION: 1. No acute pulmonary embolus, aortic aneurysm or dissection. 2. Passive atelectasis and subsegmental atelectasis, greatest at the left lung base. Aortic Atherosclerosis (ICD10-I70.0). Electronically Signed   By: Ashley Royalty M.D.   On: 06/04/2018 20:08    Procedures Procedures (including critical care time)  Medications Ordered in ED Medications  sodium chloride flush (NS) 0.9 % injection 3 mL (3 mLs Intravenous Given 06/04/18 1834)  sodium chloride 0.9 % bolus 500 mL (0 mLs Intravenous Stopped 06/04/18 1856)  iopamidol (ISOVUE-370) 76 % injection 100 mL (100 mLs Intravenous Contrast Given 06/04/18 1944)     Initial Impression / Assessment and Plan / ED Course  I have reviewed the triage vital signs and the nursing notes.  Pertinent labs & imaging results that were available during my care of the patient were reviewed by me and considered in my medical decision making (see chart for details).  68 year old male presents with dizziness and chest pain with SOB. PT is a poor historian but he was notably hypotensive which has responded to fluids. Otherwise vitals are normal. Heart is regular rate and rhythm. Lungs are CTA. Abdomen is soft, non-tender. He has no peripheral edema. EKG shows paced rhythm. CXR is normal. Labs and trop are normal. INR is therapeutic. CTA chest ordered because of his hx of cancer, hypotension, chest pain and SOB. This was negative for PE. Shared visit with Dr. Colvin Caroli. Will admit for chest pain r/o.  Final Clinical Impressions(s) / ED Diagnoses   Final diagnoses:  Nonspecific chest pain  Hypotension, unspecified hypotension type    ED  Discharge Orders    None       Recardo Evangelist, PA-C 06/04/18 2334    Charlesetta Shanks, MD 06/06/18 1137

## 2018-06-04 NOTE — H&P (Signed)
History and Physical    PLEASE NOTE THAT DRAGON DICTATION SOFTWARE WAS USED IN THE CONSTRUCTION OF THIS NOTE.   DYER KLUG HLK:[id number] DOB: 03/16/1951 DOA: 06/04/2018  PCP: [name], NP Patient coming from: home  I have personally briefly reviewed patient's old medical records in [place]  Chief Complaint: chest pain  HPI: XZAVIAN SEMMEL is a 68 y.o. male with medical history significant for coronary artery disease coronary angiography in July 2016 revealing 95% stenosis of mid RCA status post drug-eluting stent x1 and 99% stenosis of first diagonal, hypertension, hyperlipidemia, type 2 diabetes mellitus, and paroxysmal atrial fibrillation chronically anticoagulated on warfarin, who is admitted to [facility] on 06/04/2018 for further evaluation and management of presenting chest pain.  The patient reports 3 days of intermittent substernal nonradiating chest pressure brought on by exertion and relieved with rest.  Reports that episodes typically last 1 to 5 minutes before spontaneously resolving in the absence of any interval nitroglycerin, and are associated with shortness of breath and dizziness in the absence of any presyncope or syncope.  He also denies any associated palpitations, diaphoresis, or nausea/vomiting.  He reports that the chest pain is non-positional and nonreproducible with palpation of the anterior chest wall.  He feels that there is at times, a pleuritic element to the chest discomfort, stating that the chest pain appears to worsen with deep inspiration.  Patient denies any recent travel, peripheral edema, or hemoptysis.  No history of DVT/PE, although he does convey a recent diagnosis of prostate cancer, for which she is set to initiate radiation therapy.  No recent trauma.  Denies any associated subjective fever, chills, rigors, or generalized myalgias.  Denies any associated orthopnea or PND.  Past medical history notable for a known history of  coronary artery disease, with most recent coronary angiography the performed in July 2016 demonstrating 95% stenosis of the mid RCA at which time he received drug-eluting stent x1, 99% of first diagonal with ensuing recommendation for medical management due to a small aneurysmal segment.  The patient reports no subsequent ischemic evaluation, including no subsequent stress test or coronary angiography.  He acknowledges a history of hypertension, hyperlipidemia, and type 2 diabetes mellitus.  He also reports that he is a former smoker, having completely quit smoking in 2013.  In the setting of an additional episode of exertional chest pain this evening, the patient contacted EMS who brought the patient to [facility] emergency department for further evaluation.  He received a full dose aspirin in route via EMS.    ED Course: Vital signs in the ED were notable for the following: Temperature 97.3; heart rate 70-74; blood pressure was initially noted to be 95/65, which increased to 104/74 following interval IV fluids as further described low.  Respiratory rate 15-20, oxygen saturation 95 to 99% on room air.  Labs from the ED were notable and: BMP notable for sodium 139, potassium 4.0, creatinine 1.23.  Troponin x1-.  CBC notable for white cell count of 8200.  INR in the setting of chronic warfarin therapy noted to be 2.75.  Two-view chest x-ray, per final radiology report showed no evidence of acute cardiopulmonary process, including no evidence of infiltrate, edema, or effusion.  CTA of the chest demonstrated no evidence of acute pulmonary embolism, aneurysm, dissection, or infiltrate.  EKG showed evidence of paced rhythm with out evidence of acute ischemic changes.    While in the ED, the patient received a 500 cc IV  normal saline bolus.  Subsequently, he was admitted to the med telemetry floor for overnight observation for work-up and management of presenting chest pain.    Review of Systems: As per HPI  otherwise 10 point review of systems negative.   Past Medical History:  Diagnosis Date  . Arthritis    "knees and lower back" (03/14/2013)  . Atrial flutter ([place])    radiofrequency ablation in 2001  . CAD (coronary artery disease)    a. Nonobstructive. Cardiac cath in 2001-50% mid RI, normal LM, LAD, RCA b. cath 10/16/2014 95% mid RCA treated with DES, 99% ost D1 medical management due to small aneurysmal segment  . Chronic anticoagulation    chronic Coumadin anticoagulation  . Chronic obstructive pulmonary disease ([place]) 04/20/2011  . Diabetes mellitus, type 2 ([place])   . GERD (gastroesophageal reflux disease)   . Hyperlipidemia   . Hypertension    with hypertensive heart disease  . Obesity   . Persistent atrial fibrillation    recurrent atrial flutter since 2001 s/p DCCVs, multiple failed AADs, h/o tachy-mediated cardiomyopathy  . Shortness of breath    "can come on at any time" (03/14/2013)  . Sleep apnea    "dx'd; couldn't wear the mask" (03/14/2013)  . Tobacco abuse     Past Surgical History:  Procedure Laterality Date  . ATRIAL FLUTTER ABLATION  2002   atrial flutter; subsequently developed atrial fibrillation  . AV NODE ABLATION  01/24/2013  . CARDIAC CATHETERIZATION  2002  . CARDIAC CATHETERIZATION N/A 10/16/2014   Procedure: Left Heart Cath and Coronary Angiography;  Surgeon: [name], MD;  Location: [facility];  Service: Cardiovascular;  Laterality: N/A;  . CARDIOVERSION  05/31/2011   Procedure: CARDIOVERSION;  Surgeon: Cristopher Estimable. Lattie Haw, MD;  Location: [facility];  Service: Cardiovascular;  Laterality: N/A;  . CARPAL TUNNEL RELEASE Left 1980's  . INSERT / REPLACE / REMOVE PACEMAKER  01/24/2013    Medtronic Adapta L dual-chamber pacemaker, serial number [id number]   . LEFT HEART CATHETERIZATION WITH CORONARY ANGIOGRAM N/A 03/17/2013   Procedure: LEFT HEART CATHETERIZATION WITH CORONARY ANGIOGRAM;  Surgeon: Burnell Blanks, MD; LAD mild dz, D1 branch 100%,  inferior branch 99%, CFX OK, RCA 50%, EF 65%    . LOOP RECORDER IMPLANT  2002  . PERMANENT PACEMAKER INSERTION N/A 01/24/2013   Procedure: PERMANENT PACEMAKER INSERTION;  Surgeon: [name], MD;  Location: [facility] CATH LAB;  Service: Cardiovascular;  Laterality: N/A;  . TIBIAL TUBERCLERPLASTY  ~ 2003    Social History:  reports that he quit smoking about 6 years ago. His smoking use included cigarettes. He has a 42.00 pack-year smoking history. He has never used smokeless tobacco. He reports previous alcohol use. He reports that he does not use drugs.   No Known Allergies  Family History  Problem Relation Age of Onset  . Alzheimer's disease Mother   . Osteoporosis Mother      Prior to Admission medications   Medication Sig Start Date End Date Taking? Authorizing Provider  blood glucose meter kit and supplies KIT Dispense based on patient and insurance preference. Use up to four times daily as directed. (FOR ICD-9 250.00, 250.01). 10/01/17   [name], NP  buPROPion (ZYBAN) 150 MG 12 hr tablet TAKE 1 TABLET BY MOUTH TWICE A DAY 04/22/18   [name], NP  clopidogrel (PLAVIX) 75 MG tablet TAKE 1 TABLET BY MOUTH EVERY DAY 06/04/18   [name], MD  escitalopram (LEXAPRO) 10  MG tablet TAKE 1 TABLET BY MOUTH EVERY DAY 04/22/18   [name], NP  fish oil-omega-3 fatty acids 1000 MG capsule Take 1 g by mouth daily.      [provider]  fluticasone (FLONASE) 50 MCG/ACT nasal spray Place 1 spray into both nostrils daily as needed for allergies or rhinitis. 03/19/18   Alycia Rossetti, MD  furosemide (LASIX) 20 MG tablet TAKE 1 TABLET BY MOUTH EVERY DAY 01/17/18   Dena Billet B, PA-C  gabapentin (NEURONTIN) 300 MG capsule Take 1 capsule by mouth three times daily for diabetic neuropathy. 12/12/17   [name], NP  glucose blood (ONE TOUCH ULTRA TEST) test strip USE TO TEST UP TO 4 TIMES DAILY 10/23/17   [name], NP  isosorbide mononitrate (IMDUR)  30 MG 24 hr tablet TAKE 1 TABLET BY MOUTH EVERY DAY 01/10/18   [name], NP  JARDIANCE 10 MG TABS tablet TAKE 1 TABLET BY MOUTH EVERY MORNING FOR DIABETES. 05/14/18   [name], NP  lisinopril (PRINIVIL,ZESTRIL) 20 MG tablet TAKE 1 TABLET BY MOUTH EVERY DAY 10/12/17   Alycia Rossetti, MD  metFORMIN (GLUCOPHAGE) 500 MG tablet TAKE 1 TABLET BY MOUTH TWICE DAILY WITH A MEAL 04/08/18   [address], Modena Nunnery, MD  metoprolol succinate (TOPROL XL) 25 MG 24 hr tablet Take 1 tablet (25 mg total) by mouth daily. 05/10/18   [name], MD  nitroGLYCERIN (NITROSTAT) 0.4 MG SL tablet Place 1 tablet (0.4 mg total) under the tongue every 5 (five) minutes as needed for chest pain. 01/23/18   [name], NP  pantoprazole (PROTONIX) 40 MG tablet TAKE 1 TABLET BY MOUTH ONCE DAILY 07/11/17   Orlena Sheldon, PA-C  promethazine (PHENERGAN) 25 MG tablet Take 1 tablet (25 mg total) by mouth every 8 (eight) hours as needed for nausea or vomiting. 03/19/18   Alycia Rossetti, MD  rosuvastatin (CRESTOR) 20 MG tablet Take 1 tablet by mouth every evening for cholesterol. 12/12/17   [name], NP  sitaGLIPtin (JANUVIA) 100 MG tablet Take 1 tablet by mouth once daily for diabetes. 01/23/18   [name], NP  tamsulosin (FLOMAX) 0.4 MG CAPS capsule Take 1 capsule (0.4 mg total) by mouth daily. 11/20/17   Hollice Espy, MD  traMADol (ULTRAM) 50 MG tablet Take 1 tablet (50 mg total) by mouth every 6 (six) hours as needed for severe pain. Caution of sedation 11/01/17   Copland, Frederico Hamman, MD  warfarin (COUMADIN) 1 MG tablet TAKE 1 TABLET ON MONDAYS. PT. USES 1 MG TABLETS IN ADDITION TO 5 MG TABLETS. 08/08/17   [name], NP  warfarin (COUMADIN) 5 MG tablet TAKE 1 TABLET BY MOUTH ON MONDAYS AND 1/2 TABLET ALL OTHER DAYS 03/04/18   Susy Frizzle, MD     Objective     Physical Exam: Vitals:   06/04/18 2000 06/04/18 2030 06/04/18 2100 06/04/18 2130  BP: 109/77 109/80 112/78 104/74   Pulse: 73 74 70 73  Resp: '15 14 16 15  ' Temp:      TempSrc:      SpO2: 98% 95% 95% 96%  Weight:      Height:        General: appears to be stated age; alert, oriented Skin: warm, dry, no rash Head:  AT/[place] Mouth:  Oral mucosa membranes appear moist, normal dentition Neck: supple; trachea midline Heart:  RRR; did not appreciate any M/R/G Lungs: CTAB, did not appreciate  any wheezes, rales, or rhonchi Abdomen: + BS; soft, ND, NT Extremities: no peripheral edema, no muscle wasting   Labs on Admission: I have personally reviewed following labs and imaging studies  CBC: Recent Labs  Lab 06/04/18 1800  WBC 8.2  HGB 14.3  HCT 44.3  MCV 98.7  PLT PLATELET CLUMPS NOTED ON SMEAR, COUNT APPEARS DECREASED   Basic Metabolic Panel: Recent Labs  Lab 06/04/18 1800  NA 139  K 4.0  CL 104  CO2 22  GLUCOSE 177*  BUN 18  CREATININE 1.23  CALCIUM 8.7*   GFR: Estimated Creatinine Clearance: 71.1 mL/min (by C-G formula based on SCr of 1.23 mg/dL). Liver Function Tests: Recent Labs  Lab 06/04/18 1822  AST 18  ALT 14  ALKPHOS 43  BILITOT 1.1  PROT 6.0*  ALBUMIN 3.2*   No results for input(s): LIPASE, AMYLASE in the last 168 hours. No results for input(s): AMMONIA in the last 168 hours. Coagulation Profile: Recent Labs  Lab 06/04/18 1822  INR 2.75   Cardiac Enzymes: No results for input(s): CKTOTAL, CKMB, CKMBINDEX, TROPONINI in the last 168 hours. BNP (last 3 results) No results for input(s): PROBNP in the last 8760 hours. HbA1C: No results for input(s): HGBA1C in the last 72 hours. CBG: No results for input(s): GLUCAP in the last 168 hours. Lipid Profile: No results for input(s): CHOL, HDL, LDLCALC, TRIG, CHOLHDL, LDLDIRECT in the last 72 hours. Thyroid Function Tests: No results for input(s): TSH, T4TOTAL, FREET4, T3FREE, THYROIDAB in the last 72 hours. Anemia Panel: No results for input(s): VITAMINB12, FOLATE, FERRITIN, TIBC, IRON, RETICCTPCT in the last 72  hours. Urine analysis:    Component Value Date/Time   COLORURINE YELLOW (A) 11/28/2017 1453   APPEARANCEUR CLEAR (A) 11/28/2017 1453   APPEARANCEUR Clear 11/20/2017 1026   LABSPEC 1.014 11/28/2017 1453   LABSPEC 1.011 01/23/2013 0553   PHURINE 5.0 11/28/2017 1453   GLUCOSEU >=500 (A) 11/28/2017 1453   GLUCOSEU Negative 01/23/2013 0553   HGBUR NEGATIVE 11/28/2017 1453   BILIRUBINUR NEGATIVE 11/28/2017 1453   BILIRUBINUR Negative 11/20/2017 1026   BILIRUBINUR Negative 01/23/2013 0553   KETONESUR NEGATIVE 11/28/2017 1453   PROTEINUR NEGATIVE 11/28/2017 1453   NITRITE NEGATIVE 11/28/2017 1453   LEUKOCYTESUR NEGATIVE 11/28/2017 1453   LEUKOCYTESUR Negative 11/20/2017 1026   LEUKOCYTESUR Negative 01/23/2013 0553    Radiological Exams on Admission: Dg Chest 2 View  Result Date: 06/04/2018 CLINICAL DATA:  Chest pain EXAM: CHEST - 2 VIEW COMPARISON:  03/10/2016 FINDINGS: Dual lead pacemaker unchanged. Negative for heart failure. Lungs clear without infiltrate or effusion. IMPRESSION: No active cardiopulmonary disease. Electronically Signed   By: Franchot Gallo M.D.   On: 06/04/2018 19:08   Ct Angio Chest Pe W And/or Wo Contrast  Result Date: 06/04/2018 CLINICAL DATA:  Left-sided chest pain beginning last night radiating to the left arm. EXAM: CT ANGIOGRAPHY CHEST WITH CONTRAST TECHNIQUE: Multidetector CT imaging of the chest was performed using the standard protocol during bolus administration of intravenous contrast. Multiplanar CT image reconstructions and MIPs were obtained to evaluate the vascular anatomy. CONTRAST:  188m ISOVUE-370 IOPAMIDOL (ISOVUE-370) INJECTION 76% COMPARISON:  CXR 06/04/2018 FINDINGS: Cardiovascular: Satisfactory opacification of the pulmonary arteries to the segmental level. No evidence of pulmonary embolism. Normal heart size. No pericardial effusion. Aortic atherosclerosis without aneurysm. Atherosclerosis of the great vessels without acute abnormality. Left-sided  pacemaker apparatus with leads in the right atrium right ventricle are identified, best visualized on the scout view. Mediastinum/Nodes: No enlarged mediastinal, hilar, or  axillary lymph nodes. Thyroid gland, trachea, and esophagus demonstrate no significant findings. Lungs/Pleura: Passive atelectasis and subsegmental atelectasis, greatest at the left lung base. A few tiny subpleural blebs are noted at the right lung apex. No pneumothorax or dominant mass. No focal pulmonary consolidation. Upper Abdomen: No acute abnormality. Musculoskeletal: No chest wall abnormality. No acute or significant osseous findings. Review of the MIP images confirms the above findings. IMPRESSION: 1. No acute pulmonary embolus, aortic aneurysm or dissection. 2. Passive atelectasis and subsegmental atelectasis, greatest at the left lung base. Aortic Atherosclerosis (ICD10-I70.0). Electronically Signed   By: Ashley Royalty M.D.   On: 06/04/2018 20:08     Assessment/Plan   LUDGER BONES is a 68 y.o. male with medical history significant for coronary artery disease coronary angiography in July 2016 revealing 95% stenosis of mid RCA status post drug-eluting stent x1 and 99% stenosis of first diagonal, hypertension, hyperlipidemia, type 2 diabetes mellitus, and paroxysmal atrial fibrillation chronically anticoagulated on warfarin, who is admitted to [facility] on 06/04/2018 for further evaluation and management of presenting chest pain.   Principal Problem:   Chest pain Active Problems:   Chronic anticoagulation   Atrial fibrillation   Hypertension   Diabetes mellitus type 2, uncomplicated (HCC)   #) Chest pain: The patient presents with 3 days of intermittent substernal chest pressure that occurs with exertion and improves with rest, although he has not received any nitroglycerin over that time.  Given the presence of typical features and multiple risk factors for progression of known underlying coronary artery disease,  the patient is admitted for overnight observation for ACS rule out.  This is also in the context of a heart score of 5, conferring moderate probability of risk for major adverse cardiac event over the ensuing 6 weeks.  Should the patient subsequently ruled out for ACS, can consider discussions with cardiology regarding recommendations for the need/nature of additional ischemic evaluation.  Of note, presenting EKG demonstrates no evidence of acute ischemic changes and initial troponin has been found to be negative.  The patient is currently chest pain-free.  Additionally, CT of the chest performed today showed no evidence of acute pulmonary embolism or other acute process.  Full dose aspirin was administered via EMS this evening.  Plan: I have ordered patient to receive dose of his home rosuvastatin now for plaque stabilization qualities.  Continue home beta-blocker.  PRN sublingual nitroglycerin for subsequent chest discomfort.  PRN EKG for any subsequent chest discomfort.  Monitor on telemetry.  Trend serial troponin.  Consider discussion with cardiology regarding need for additional ischemic evaluation should the patient rule out for ACS, as further described above.    #) Type 2 diabetes mellitus: Complicated by peripheral polyneuropathy.  On metformin and Januvia as an outpatient, as well as gabapentin for the component of peripheral polyneuropathy.  Presenting blood sugar per presenting BMP noted to be 177.  Plan: Hold home metformin and Januvia.  I have ordered Accu-Cheks q. before meals and at bedtime with associated sliding scale insulin.  Continue home gabapentin.    #) Essential hypertension: Outpatient antihypertensive regimen includes Imdur, lisinopril, Toprol-XL.  There are also potential antihypertensive ramifications of outpatient Flomax.  Presenting vital signs reflect borderline hypotension, with initial blood pressure 95/65 improving slightly to 104/74 following interval IV fluids, as  above.  Plan: We will hold home antihypertensives overnight, with close interval monitoring of blood pressures, with reevaluation of timing of resumption of these medications to occur in the morning.    #)  Paroxysmal atrial fibrillation: chronically anticoagulated on warfarin, with presenting INR found to be therapeutic at 2.75.  Additionally, he is on Toprol-XL as an outpatient.  Presenting EKG reflects paced rhythm without evidence of acute ischemic changes.  Plan: I have placed an inpatient pharmacy consult for assistance with warfarin dosing during his hospitalization.  Continue home Toprol-XL.   DVT prophylaxis: Continue home warfarin Code Status: full Family Communication: None Disposition Plan: Per Rounding Team Consults called: none  Admission status: Observation, med telemetry.    PLEASE NOTE THAT DRAGON DICTATION SOFTWARE WAS USED IN THE CONSTRUCTION OF THIS NOTE.   [facility] Pager [phone] From [place].   Otherwise, please contact night-coverage  [email or web address] Password [facility]  06/04/2018, 10:47 PM

## 2018-06-04 NOTE — ED Provider Notes (Signed)
Medical screening examination/treatment/procedure(s) were conducted as a shared visit with non-physician practitioner(s) and myself.  I personally evaluated the patient during the encounter.  EKG Interpretation  Date/Time:  Tuesday June 04 2018 17:55:12 EST Ventricular Rate:  74 PR Interval:    QRS Duration: 178 QT Interval:  499 QTC Calculation: 554 R Axis:   -76 Text Interpretation:  no change in QRS  or T wave morphology as compared to previous paced tracing.  pacer spikes no prominent as previous but complexes appear paces. Confirmed by Charlesetta Shanks [phone]) on 06/04/2018 6:07:20 PM Patient presents with chest pain and dizziness.  He has history of atrial flutter on Coumadin.  He reports over the past 2 days now he has been experiencing chest pain when he is out walking his dogs.  He reports also with position change he is started to feel very dizzy and as if he might pass out.  Patient is alert and appropriate.  Nontoxic.  No respiratory distress.  Movements coordinated purposeful symmetric.  Patient describes exertional chest pain over 2 days now.  With known coronary artery disease history.  He also describes lightheadedness and near syncope although this does sound positional.  At this time plan for obvious rule out.  I agree with plan of management.   Charlesetta Shanks, MD 06/06/18 1136

## 2018-06-04 NOTE — ED Notes (Signed)
Patients Medtronic pacemaker interrogated at this time. Results sent to MD Pfeiffer/PA Claiborne Billings.

## 2018-06-04 NOTE — ED Notes (Signed)
Patient transported to X-ray 

## 2018-06-04 NOTE — Telephone Encounter (Signed)
Noted. Given unilateral numbness with dizziness and shortness of breath his symptoms are concerning. Agree with plan.

## 2018-06-04 NOTE — Telephone Encounter (Signed)
[name] called and I spoke with pt who gave me permission to speak with [name]; [name] asked if pt told me that he started with cold symptoms over the weekend. I advised her that pt did not mention cold symptoms; pt said that he was feeling weak and dizzy with numbness on and off in lt hand and SOB was worse to the point pt gives out of breath just walking to bathroom. [name] said that pt's face is not drawn and he can walk. I voiced understanding but pt still needs to be evaluated at ED. [name] voiced understanding. [name] also said that pt BS was 160 and BP was 107/68 P 92. Pt has taken a Coricidin HBP. [name] voiced understanding that pt was to go to ED for eval. FYI to Gentry Fitz NP.

## 2018-06-04 NOTE — ED Triage Notes (Addendum)
Pt from home via ems; c/o L sided cp that began last night, radiates to L arm; described as sharp; hx MI; pt has pacemaker; intermittent nausea associated; pale and diaphoretic on ems arrival; 6/10; increases with inspiration; initial pressure 80/40, pt given 400 mL NS; pt give 324 ASA PTA, no nitro given; pt called PCP this afternoon, recommended pt come to ED for evaluation; pt also c/o abdominal tenderness; pt on lasix but is unsure why   P 96 vent paced 90% RA - placed on 4L o2 99% 110/70 CBG 218 RR 16 T 97.28F temporal

## 2018-06-05 ENCOUNTER — Other Ambulatory Visit: Payer: Self-pay

## 2018-06-05 ENCOUNTER — Ambulatory Visit: Payer: Medicare Other | Admitting: Podiatry

## 2018-06-05 DIAGNOSIS — K219 Gastro-esophageal reflux disease without esophagitis: Secondary | ICD-10-CM

## 2018-06-05 DIAGNOSIS — I2510 Atherosclerotic heart disease of native coronary artery without angina pectoris: Secondary | ICD-10-CM

## 2018-06-05 DIAGNOSIS — E1111 Type 2 diabetes mellitus with ketoacidosis with coma: Secondary | ICD-10-CM

## 2018-06-05 DIAGNOSIS — I4819 Other persistent atrial fibrillation: Secondary | ICD-10-CM

## 2018-06-05 DIAGNOSIS — E119 Type 2 diabetes mellitus without complications: Secondary | ICD-10-CM | POA: Diagnosis not present

## 2018-06-05 DIAGNOSIS — I10 Essential (primary) hypertension: Secondary | ICD-10-CM | POA: Diagnosis not present

## 2018-06-05 DIAGNOSIS — R079 Chest pain, unspecified: Secondary | ICD-10-CM | POA: Diagnosis not present

## 2018-06-05 DIAGNOSIS — Z7901 Long term (current) use of anticoagulants: Secondary | ICD-10-CM | POA: Diagnosis not present

## 2018-06-05 DIAGNOSIS — C61 Malignant neoplasm of prostate: Secondary | ICD-10-CM

## 2018-06-05 LAB — BASIC METABOLIC PANEL
Anion gap: 9 (ref 5–15)
BUN: 15 mg/dL (ref 8–23)
CO2: 24 mmol/L (ref 22–32)
Calcium: 9 mg/dL (ref 8.9–10.3)
Chloride: 105 mmol/L (ref 98–111)
Creatinine, Ser: 1.11 mg/dL (ref 0.61–1.24)
GFR calc Af Amer: >60 mL/min (ref >60)
GFR calc non Af Amer: >60 mL/min (ref >60)
Glucose, Bld: 103 mg/dL — ABNORMAL HIGH (ref 70–99)
Potassium: 4.2 mmol/L (ref 3.5–5.1)
Sodium: 138 mmol/L (ref 135–145)

## 2018-06-05 LAB — MAGNESIUM: Magnesium: 1.8 mg/dL (ref 1.7–2.4)

## 2018-06-05 LAB — GLUCOSE, CAPILLARY
Glucose-Capillary: 97 mg/dL (ref 70–99)
Glucose-Capillary: 107 mg/dL — ABNORMAL HIGH (ref 70–99)
Glucose-Capillary: 130 mg/dL — ABNORMAL HIGH (ref 70–99)
Glucose-Capillary: 145 mg/dL — ABNORMAL HIGH (ref 70–99)

## 2018-06-05 LAB — HIV ANTIBODY (ROUTINE TESTING W REFLEX): HIV Screen 4th Generation wRfx: NONREACTIVE

## 2018-06-05 LAB — CBC
HCT: 45 % (ref 39.0–52.0)
Hemoglobin: 14.6 g/dL (ref 13.0–17.0)
MCH: 32 pg (ref 26.0–34.0)
MCHC: 32.4 g/dL (ref 30.0–36.0)
MCV: 98.7 fL (ref 80.0–100.0)
Platelets: 108 10*3/uL — ABNORMAL LOW (ref 150–400)
RBC: 4.56 MIL/uL (ref 4.22–5.81)
RDW: 14 % (ref 11.5–15.5)
WBC: 7.8 10*3/uL (ref 4.0–10.5)
nRBC: 0 % (ref 0.0–0.2)

## 2018-06-05 LAB — PROTIME-INR
INR: 2.75
Prothrombin Time: 28.7 seconds — ABNORMAL HIGH (ref 11.4–15.2)

## 2018-06-05 LAB — TROPONIN I: Troponin I: <0.03 ng/mL (ref <0.03)

## 2018-06-05 MED ORDER — ONDANSETRON HCL 4 MG PO TABS: 4.0000 mg | ORAL_TABLET | Freq: Four times a day (QID) | ORAL | Status: DC | PRN

## 2018-06-05 MED ORDER — ENOXAPARIN SODIUM 40 MG/0.4ML ~~LOC~~ SOLN: 40.0000 mg | SUBCUTANEOUS | Status: DC

## 2018-06-05 MED ORDER — CLOPIDOGREL BISULFATE 75 MG PO TABS
75.0000 mg | ORAL_TABLET | Freq: Every day | ORAL | Status: DC
  Administered 2018-06-05 – 2018-06-07 (×3): 75 mg via ORAL
  Filled 2018-06-05 (×4): qty 1

## 2018-06-05 MED ORDER — CYCLOPENTOLATE HCL 1 % OP SOLN: 3.0000 [drp] | Freq: Once | OPHTHALMIC | Status: DC

## 2018-06-05 MED ORDER — ACETAMINOPHEN 325 MG PO TABS
650.0000 mg | ORAL_TABLET | Freq: Four times a day (QID) | ORAL | Status: DC | PRN
  Administered 2018-06-05: 650 mg via ORAL
  Filled 2018-06-05: qty 2

## 2018-06-05 MED ORDER — NITROGLYCERIN 0.4 MG SL SUBL: 0.4000 mg | SUBLINGUAL_TABLET | SUBLINGUAL | Status: DC | PRN

## 2018-06-05 MED ORDER — ONDANSETRON HCL 4 MG/2ML IJ SOLN: 4.0000 mg | Freq: Four times a day (QID) | INTRAMUSCULAR | Status: DC | PRN

## 2018-06-05 MED ORDER — WARFARIN SODIUM 2.5 MG PO TABS
2.5000 mg | ORAL_TABLET | Freq: Once | ORAL | Status: AC
  Administered 2018-06-05: 2.5 mg via ORAL
  Filled 2018-06-05: qty 1

## 2018-06-05 MED ORDER — ROSUVASTATIN CALCIUM 20 MG PO TABS
20.0000 mg | ORAL_TABLET | Freq: Every evening | ORAL | Status: DC
  Administered 2018-06-05 – 2018-06-06 (×3): 20 mg via ORAL
  Filled 2018-06-05 (×3): qty 1

## 2018-06-05 MED ORDER — INSULIN ASPART 100 UNIT/ML ~~LOC~~ SOLN
0.0000 [IU] | Freq: Three times a day (TID) | SUBCUTANEOUS | Status: DC
  Administered 2018-06-07: 2 [IU] via SUBCUTANEOUS

## 2018-06-05 MED ORDER — WARFARIN - PHARMACIST DOSING INPATIENT: Freq: Every day | Status: DC

## 2018-06-05 MED ORDER — PANTOPRAZOLE SODIUM 40 MG PO TBEC
40.0000 mg | DELAYED_RELEASE_TABLET | Freq: Every day | ORAL | Status: DC
  Administered 2018-06-05 – 2018-06-07 (×3): 40 mg via ORAL
  Filled 2018-06-05 (×4): qty 1

## 2018-06-05 MED ORDER — GABAPENTIN 300 MG PO CAPS
300.0000 mg | ORAL_CAPSULE | Freq: Three times a day (TID) | ORAL | Status: DC
  Administered 2018-06-05 – 2018-06-07 (×7): 300 mg via ORAL
  Filled 2018-06-05 (×8): qty 1

## 2018-06-05 MED ORDER — SODIUM CHLORIDE 0.9 % IV SOLN
INTRAVENOUS | Status: DC
  Administered 2018-06-05 (×2): via INTRAVENOUS

## 2018-06-05 MED ORDER — METOPROLOL SUCCINATE ER 25 MG PO TB24
25.0000 mg | ORAL_TABLET | Freq: Every day | ORAL | Status: DC
  Administered 2018-06-05 – 2018-06-07 (×3): 25 mg via ORAL
  Filled 2018-06-05 (×3): qty 1

## 2018-06-05 MED ORDER — LISINOPRIL 20 MG PO TABS
20.0000 mg | ORAL_TABLET | Freq: Every day | ORAL | Status: DC
  Administered 2018-06-05 – 2018-06-06 (×2): 20 mg via ORAL
  Filled 2018-06-05 (×2): qty 1

## 2018-06-05 MED ORDER — BUPROPION HCL ER (SR) 150 MG PO TB12
150.0000 mg | ORAL_TABLET | Freq: Two times a day (BID) | ORAL | Status: DC
  Administered 2018-06-06 – 2018-06-07 (×3): 150 mg via ORAL
  Filled 2018-06-05 (×4): qty 1

## 2018-06-05 MED ORDER — ACETAMINOPHEN 650 MG RE SUPP: 650.0000 mg | Freq: Four times a day (QID) | RECTAL | Status: DC | PRN

## 2018-06-05 MED ORDER — ESCITALOPRAM OXALATE 10 MG PO TABS
10.0000 mg | ORAL_TABLET | Freq: Every day | ORAL | Status: DC
  Administered 2018-06-05 – 2018-06-07 (×3): 10 mg via ORAL
  Filled 2018-06-05 (×3): qty 1

## 2018-06-05 MED ORDER — ISOSORBIDE MONONITRATE ER 30 MG PO TB24
30.0000 mg | ORAL_TABLET | Freq: Every day | ORAL | Status: DC
  Administered 2018-06-05 – 2018-06-07 (×3): 30 mg via ORAL
  Filled 2018-06-05 (×3): qty 1

## 2018-06-05 NOTE — Progress Notes (Signed)
ANTICOAGULATION CONSULT NOTE - Initial Consult  Pharmacy Consult for coumadin Indication: atrial fibrillation  No Known Allergies  Patient Measurements: Height: 5\' 11"  (180.3 cm) Weight: 220 lb (99.8 kg) IBW/kg (Calculated) : 75.3   Vital Signs: Temp: 97.3 F (36.3 C) (02/18 1752) Temp Source: Oral (02/18 1752) BP: 104/74 (02/18 2130) Pulse Rate: 73 (02/18 2130)  Labs: Recent Labs    06/04/18 1800 06/04/18 1822  HGB 14.3  --   HCT 44.3  --   PLT PLATELET CLUMPS NOTED ON SMEAR, COUNT APPEARS DECREASED  --   LABPROT  --  28.7*  INR  --  2.75  CREATININE 1.23  --     Estimated Creatinine Clearance: 71.1 mL/min (by C-G formula based on SCr of 1.23 mg/dL).   Medical History: Past Medical History:  Diagnosis Date  . Arthritis    "knees and lower back" (03/14/2013)  . Atrial flutter ([place])    radiofrequency ablation in 2001  . CAD (coronary artery disease)    a. Nonobstructive. Cardiac cath in 2001-50% mid RI, normal LM, LAD, RCA b. cath 10/16/2014 95% mid RCA treated with DES, 99% ost D1 medical management due to small aneurysmal segment  . Chronic anticoagulation    chronic Coumadin anticoagulation  . Chronic obstructive pulmonary disease ([place]) 04/20/2011  . Diabetes mellitus, type 2 ([place])   . GERD (gastroesophageal reflux disease)   . Hyperlipidemia   . Hypertension    with hypertensive heart disease  . Obesity   . Persistent atrial fibrillation    recurrent atrial flutter since 2001 s/p DCCVs, multiple failed AADs, h/o tachy-mediated cardiomyopathy  . Shortness of breath    "can come on at any time" (03/14/2013)  . Sleep apnea    "dx'd; couldn't wear the mask" (03/14/2013)  . Tobacco abuse     Medications:  See medication history Coumadin 6mg  on Monday, 2.5 mg other days per med rec  Assessment: 68 yo man to continue coumadin for afib.  Last dose yesterday morning.  INR 2.75 Goal of Therapy:  INR 2-3 Monitor platelets by anticoagulation protocol: Yes    Plan:  Daily PT/INR Monitor for bleeding complications  King Pinzon Poteet 06/05/2018,12:38 AM

## 2018-06-05 NOTE — Consult Note (Signed)
.  Cardiology Consultation:   Patient ID: [name] Cook MRN: [id number]; DOB: 1951/01/17  Admit date: 06/04/2018 Date of Consult: 06/05/2018  Primary Care Provider: [name], NP Primary Cardiologist: [name], MD  Primary Electrophysiologist:  None    Patient Profile:   [name] Cook is a 68 y.o. male with a hx of CAD s/p DES to RCA (2016) and 100% stenosis of D1 treated medically, atrial fibrillation on coumadin s/p AV node ablation with PPM in place, chronic systolic and diastolic heart failure, hypertension, HLD, DM, tobacco abuse, and COPD who is being seen today for the evaluation of chest pain at the request of Dr. Tamala Julian.  History of Present Illness:   [name] Cook was recently seen by [name] on 05/10/18. He was doing well at that time and reported no anginal symptoms. He has a history of CAD with last heart cath in 2016 with DES to RCA and 100% stenosis of the first diagonal treated medically. Since that time, he has done well. He has a history of AV node ablation for Afib with PPM in place, followed by [name]. He also has a history of chronic systolic and diastolic heart failure. Last echo 10/16/14 with EF of  35-40%.   He presented to [facility] last evening with chest pain. On my interview, history is difficult to gather. The patient describes a 2-3 day history of exertional chest pain that resolves with rest. However, upon further questioning, he states that he woke up with the chest pain Sat, again on Sunday, and on Monday morning. He states family members (EMT and nurse) made him come to the ER. He describes chest pain in the right side of his chest that radiates down his left arm rated as a 6/10 and associated with SOB and diaphoresis. The pain was relieved with rest. The pain did not wake him from sleep, but he woke up with the pain. The pain was initially reproducible by palpation, according to the patient, and was worse with deep inspiration. He denies heart racing,  palpitations, orthopnea. He does report periods of what sounds like orthostatic hypotension over the past month. When he bends down and then stands up, he becomes lightheaded and dizzy. He denies syncope.    Past Medical History:  Diagnosis Date  . Arthritis    "knees and lower back" (03/14/2013)  . Atrial flutter ([place])    radiofrequency ablation in 2001  . CAD (coronary artery disease)    a. Nonobstructive. Cardiac cath in 2001-50% mid RI, normal LM, LAD, RCA b. cath 10/16/2014 95% mid RCA treated with DES, 99% ost D1 medical management due to small aneurysmal segment  . Chronic anticoagulation    chronic Coumadin anticoagulation  . Chronic obstructive pulmonary disease ([place]) 04/20/2011  . Diabetes mellitus, type 2 ([place])   . GERD (gastroesophageal reflux disease)   . Hyperlipidemia   . Hypertension    with hypertensive heart disease  . Obesity   . Persistent atrial fibrillation    recurrent atrial flutter since 2001 s/p DCCVs, multiple failed AADs, h/o tachy-mediated cardiomyopathy  . Shortness of breath    "can come on at any time" (03/14/2013)  . Sleep apnea    "dx'd; couldn't wear the mask" (03/14/2013)  . Tobacco abuse     Past Surgical History:  Procedure Laterality Date  . ATRIAL FLUTTER ABLATION  2002   atrial flutter; subsequently developed atrial fibrillation  . AV NODE ABLATION  01/24/2013  . CARDIAC CATHETERIZATION  2002  .  CARDIAC CATHETERIZATION N/A 10/16/2014   Procedure: Left Heart Cath and Coronary Angiography;  Surgeon: [name], MD;  Location: [facility];  Service: Cardiovascular;  Laterality: N/A;  . CARDIOVERSION  05/31/2011   Procedure: CARDIOVERSION;  Surgeon: Cristopher Estimable. Lattie Haw, MD;  Location: [facility];  Service: Cardiovascular;  Laterality: N/A;  . CARPAL TUNNEL RELEASE Left 1980's  . INSERT / REPLACE / REMOVE PACEMAKER  01/24/2013    Medtronic Adapta L dual-chamber pacemaker, serial number [id number]   . LEFT HEART CATHETERIZATION WITH  CORONARY ANGIOGRAM N/A 03/17/2013   Procedure: LEFT HEART CATHETERIZATION WITH CORONARY ANGIOGRAM;  Surgeon: Burnell Blanks, MD; LAD mild dz, D1 branch 100%, inferior branch 99%, CFX OK, RCA 50%, EF 65%    . LOOP RECORDER IMPLANT  2002  . PERMANENT PACEMAKER INSERTION N/A 01/24/2013   Procedure: PERMANENT PACEMAKER INSERTION;  Surgeon: [name], MD;  Location: [facility] CATH LAB;  Service: Cardiovascular;  Laterality: N/A;  . TIBIAL TUBERCLERPLASTY  ~ 2003     Home Medications:  Prior to Admission medications   Medication Sig Start Date End Date Taking? Authorizing Provider  buPROPion (ZYBAN) 150 MG 12 hr tablet TAKE 1 TABLET BY MOUTH TWICE A DAY Patient taking differently: Take 150 mg by mouth 2 (two) times daily.  04/22/18  Yes [name], NP  clopidogrel (PLAVIX) 75 MG tablet TAKE 1 TABLET BY MOUTH EVERY DAY Patient taking differently: Take 75 mg by mouth daily.  06/04/18  Yes [name], MD  escitalopram (LEXAPRO) 10 MG tablet TAKE 1 TABLET BY MOUTH EVERY DAY Patient taking differently: Take 10 mg by mouth daily.  04/22/18  Yes [name], NP  fish oil-omega-3 fatty acids 1000 MG capsule Take 1 g by mouth daily.     Yes [provider]  furosemide (LASIX) 20 MG tablet TAKE 1 TABLET BY MOUTH EVERY DAY Patient taking differently: Take 20 mg by mouth daily.  01/17/18  Yes Dena Billet B, PA-C  gabapentin (NEURONTIN) 300 MG capsule Take 1 capsule by mouth three times daily for diabetic neuropathy. Patient taking differently: Take 300 mg by mouth 3 (three) times daily.  12/12/17  Yes [name], NP  isosorbide mononitrate (IMDUR) 30 MG 24 hr tablet TAKE 1 TABLET BY MOUTH EVERY DAY Patient taking differently: Take 30 mg by mouth daily.  01/10/18  Yes [name], NP  JARDIANCE 10 MG TABS tablet TAKE 1 TABLET BY MOUTH EVERY MORNING FOR DIABETES. Patient taking differently: Take 10 mg by mouth daily.  05/14/18  Yes [name], NP  lisinopril  (PRINIVIL,ZESTRIL) 20 MG tablet TAKE 1 TABLET BY MOUTH EVERY DAY Patient taking differently: Take 20 mg by mouth daily.  10/12/17  Yes [place], Modena Nunnery, MD  metFORMIN (GLUCOPHAGE) 500 MG tablet TAKE 1 TABLET BY MOUTH TWICE DAILY WITH A MEAL Patient taking differently: Take 500 mg by mouth 2 (two) times daily with a meal.  04/08/18  Yes [place], Modena Nunnery, MD  metoprolol succinate (TOPROL XL) 25 MG 24 hr tablet Take 1 tablet (25 mg total) by mouth daily. 05/10/18  Yes [name], MD  nitroGLYCERIN (NITROSTAT) 0.4 MG SL tablet Place 1 tablet (0.4 mg total) under the tongue every 5 (five) minutes as needed for chest pain. 01/23/18  Yes [name], NP  pantoprazole (PROTONIX) 40 MG tablet TAKE 1 TABLET BY MOUTH ONCE DAILY Patient taking differently: Take 40 mg by mouth daily.  07/11/17  Yes Dena Billet  B, PA-C  rosuvastatin (CRESTOR) 20 MG tablet Take 1 tablet by mouth every evening for cholesterol. Patient taking differently: Take 20 mg by mouth every evening.  12/12/17  Yes [name], NP  sitaGLIPtin (JANUVIA) 100 MG tablet Take 1 tablet by mouth once daily for diabetes. Patient taking differently: Take 100 mg by mouth daily.  01/23/18  Yes [name], NP  tamsulosin (FLOMAX) 0.4 MG CAPS capsule Take 1 capsule (0.4 mg total) by mouth daily. 11/20/17  Yes Hollice Espy, MD  traMADol (ULTRAM) 50 MG tablet Take 1 tablet (50 mg total) by mouth every 6 (six) hours as needed for severe pain. Caution of sedation 11/01/17  Yes [place], Frederico Hamman, MD  warfarin (COUMADIN) 1 MG tablet TAKE 1 TABLET ON MONDAYS. PT. USES 1 MG TABLETS IN ADDITION TO 5 MG TABLETS. Patient taking differently: Take 1 mg by mouth See admin instructions. Take 1 tablet on Mondays with 5 mg to equal 6 mg 08/08/17  Yes [name], NP  warfarin (COUMADIN) 5 MG tablet TAKE 1 TABLET BY MOUTH ON MONDAYS AND 1/2 TABLET ALL OTHER DAYS Patient taking differently: Take 2.5-5 mg by mouth See admin instructions. Take 1  tablet on Mondays with 1 mg to equal 6 mg then take 1/2 tablet all the other days 03/04/18  Yes Susy Frizzle, MD  blood glucose meter kit and supplies KIT Dispense based on patient and insurance preference. Use up to four times daily as directed. (FOR ICD-9 250.00, 250.01). 10/01/17   [name], NP  fluticasone [facility]) 50 MCG/ACT nasal spray Place 1 spray into both nostrils daily as needed for allergies or rhinitis. Patient not taking: Reported on 06/04/2018 03/19/18   Alycia Rossetti, MD  glucose blood (ONE TOUCH ULTRA TEST) test strip USE TO TEST UP TO 4 TIMES DAILY 10/23/17   [name], NP  promethazine (PHENERGAN) 25 MG tablet Take 1 tablet (25 mg total) by mouth every 8 (eight) hours as needed for nausea or vomiting. Patient not taking: Reported on 06/04/2018 03/19/18   Alycia Rossetti, MD    Inpatient Medications: Scheduled Meds: . [START ON 06/06/2018] buPROPion  150 mg Oral BID  . clopidogrel  75 mg Oral Daily  . escitalopram  10 mg Oral Daily  . gabapentin  300 mg Oral TID  . insulin aspart  0-15 Units Subcutaneous TID WC  . isosorbide mononitrate  30 mg Oral Daily  . lisinopril  20 mg Oral Daily  . metoprolol succinate  25 mg Oral Daily  . pantoprazole  40 mg Oral Daily  . rosuvastatin  20 mg Oral QPM  . warfarin  2.5 mg Oral ONCE-1800  . Warfarin - Pharmacist Dosing Inpatient   Does not apply q1800   Continuous Infusions: . sodium chloride 75 mL/hr at 06/05/18 1519   PRN Meds: acetaminophen **OR** acetaminophen, nitroGLYCERIN, ondansetron **OR** ondansetron (ZOFRAN) IV  Allergies:   No Known Allergies  Social History:   Social History   Socioeconomic History  . Marital status: Widowed    Spouse name: Not on file  . Number of children: 1  . Years of education: Not on file  . Highest education level: Not on file  Occupational History  . Occupation: Merchandiser, retail: UNEMPLOYED  Social Needs  . Financial resource strain: Not on file  .  Food insecurity:    Worry: Not on file    Inability: Not on file  . Transportation needs:  Medical: Not on file    Non-medical: Not on file  Tobacco Use  . Smoking status: Former Smoker    Packs/day: 1.00    Years: 42.00    Pack years: 42.00    Types: Cigarettes    Last attempt to quit: 12/31/2011    Years since quitting: 6.4  . Smokeless tobacco: Never Used  Substance and Sexual Activity  . Alcohol use: Not Currently    Comment: 03/14/2013 "stopped drinking back in 2002; never had problem w/it"  . Drug use: No  . Sexual activity: Not Currently  Lifestyle  . Physical activity:    Days per week: Not on file    Minutes per session: Not on file  . Stress: Not on file  Relationships  . Social connections:    Talks on phone: Not on file    Gets together: Not on file    Attends religious service: Not on file    Active member of club or organization: Not on file    Attends meetings of clubs or organizations: Not on file    Relationship status: Not on file  . Intimate partner violence:    Fear of current or ex partner: Not on file    Emotionally abused: Not on file    Physically abused: Not on file    Forced sexual activity: Not on file  Other Topics Concern  . Not on file  Social History Narrative   Single.   Retired.    1 son, deceased.    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   Enjoys playing on the computer.        Family History:    Family History  Problem Relation Age of Onset  . Alzheimer's disease Mother   . Osteoporosis Mother      ROS:  Please see the history of present illness.   All other ROS reviewed and negative.     Physical Exam/Data:   Vitals:   06/04/18 2130 06/05/18 0449 06/05/18 0801 06/05/18 1236  BP: 104/74 123/77 119/80 104/64  Pulse: 73  71 71  Resp: '15 16 18 18  ' Temp:  98.2 F (36.8 C) 97.7 F (36.5 C) (!) 97.5 F (36.4 C)  TempSrc:  Oral Oral Oral  SpO2: 96% 96% 96% 96%  Weight:    101.3 kg   Height:   '5\' 11"'  (1.803 m)     Intake/Output Summary (Last 24 hours) at 06/05/2018 1628 Last data filed at 06/05/2018 1519 Gross per 24 hour  Intake 927.91 ml  Output -  Net 927.91 ml   Last 3 Weights 06/05/2018 06/04/2018 05/30/2018  Weight (lbs) 223 lb 5.2 oz 220 lb 224 lb 6.9 oz  Weight (kg) 101.3 kg 99.791 kg 101.8 kg     Body mass index is 31.15 kg/m.  General:  Well nourished, well developed, in no acute distress HEENT: normal Neck: no JVD Vascular: No carotid bruits Cardiac:  Regular rhythm, regular rate, PPM placed on left Lungs:  clear to auscultation bilaterally, no wheezing, rhonchi or rales  Abd: soft, nontender, no hepatomegaly  Ext: no edema Musculoskeletal:  No deformities, BUE and BLE strength normal and equal Skin: warm and dry  Neuro:  CNs 2-12 intact, no focal abnormalities noted Psych:  Normal affect   EKG:  The EKG was personally reviewed and demonstrates:  Paced rhythm, Afib Telemetry:  Telemetry was personally reviewed and demonstrates:  Paced rhythm  Relevant CV Studies:  Left heart cath  10/16/14 1. There is moderate left ventricular systolic dysfunction. 2. Lat 1st Diag lesion, 100% stenosed. 3. Ost 1st Diag to 1st Diag lesion, 99% stenosed. 4. Mid RCA-1 lesion, 30% stenosed. 5. Mid RCA-2 lesion, 95% stenosed. There is a 0% residual stenosis post intervention. 6. A drug-eluting stent was placed.   FINAL CONCLUSIONS:  SEVERE RCA STENOSIS TREATED SUCCESSFULLY WITH PCI USING A SYNERGY DES  SEVERE DIAGONAL STENOSIS, UNCHANGED FROM PREVIOUS STUDY, WITH MEDICAL THERAPY RECOMMENDED  NONOBSTRUCTIVE LAD AND LCX STENOSIS  MODERATE LV SYSTOLIC DYSFUNCTION  RECOMMENDATIONS:  ASA 81 MG, PLAVIX 75 MG, AND WARFARIN X ONE MONTH, THEN DC ASA  SHOULD BE STABLE FOR DISCHARGE TOMORROW AND INR CAN TREND UP AFTER DISCHARGE. WARFARIN RESTARTED TODAY .   Echo 10/16/14: Study Conclusions  - Left ventricle: The cavity size was normal. Wall thickness  was   increased in a pattern of moderate LVH. Anterior wall and   peri-apical moderate-severe hypokinesis. Systolic function was   moderately reduced. The estimated ejection fraction was in the   range of 35% to 40%. Indeterminant diastolic function (atrial   fibrillation). - Aortic valve: There was no stenosis. - Mitral valve: There was trivial regurgitation. - Left atrium: The atrium was mildly dilated. - Right ventricle: The cavity size was normal. Pacer wire or   catheter noted in right ventricle. Systolic function was normal. - Right atrium: The atrium was mildly dilated. - Pulmonary arteries: No complete TR doppler jet so unable to   estimate PA systolic pressure. - Inferior vena cava: The vessel was normal in size. The   respirophasic diameter changes were in the normal range (>= 50%),   consistent with normal central venous pressure.  Impressions:  - Technically difficult study with poor acoustic windows. Echo   contrast may have helped but was not used. Normal LV size with   moderate LV hypertrophy. EF 35-40%. Moderate to severe   hypokinesis of the anterior wall and peri-apically. Normal RV   size and systolic function. No significant valvular   abnormalities.  Laboratory Data:  Chemistry Recent Labs  Lab 06/04/18 1800 06/05/18 0203  NA 139 138  K 4.0 4.2  CL 104 105  CO2 22 24  GLUCOSE 177* 103*  BUN 18 15  CREATININE 1.23 1.11  CALCIUM 8.7* 9.0  GFRNONAA >60 >60  GFRAA >60 >60  ANIONGAP 13 9    Recent Labs  Lab 06/04/18 1822  PROT 6.0*  ALBUMIN 3.2*  AST 18  ALT 14  ALKPHOS 43  BILITOT 1.1   Hematology Recent Labs  Lab 06/04/18 1800 06/05/18 0203  WBC 8.2 7.8  RBC 4.49 4.56  HGB 14.3 14.6  HCT 44.3 45.0  MCV 98.7 98.7  MCH 31.8 32.0  MCHC 32.3 32.4  RDW 13.8 14.0  PLT PLATELET CLUMPS NOTED ON SMEAR, COUNT APPEARS DECREASED 108*   Cardiac Enzymes Recent Labs  Lab 06/05/18 0203  TROPONINI <0.03    Recent Labs  Lab  06/04/18 1835  TROPIPOC 0.00    BNPNo results for input(s): BNP, PROBNP in the last 168 hours.  DDimer No results for input(s): DDIMER in the last 168 hours.  Radiology/Studies:  Dg Chest 2 View  Result Date: 06/04/2018 CLINICAL DATA:  Chest pain EXAM: CHEST - 2 VIEW COMPARISON:  03/10/2016 FINDINGS: Dual lead pacemaker unchanged. Negative for heart failure. Lungs clear without infiltrate or effusion. IMPRESSION: No active cardiopulmonary disease. Electronically Signed   By: Franchot Gallo M.D.   On: 06/04/2018 19:08   Ct  Angio Chest Pe W And/or Wo Contrast  Result Date: 06/04/2018 CLINICAL DATA:  Left-sided chest pain beginning last night radiating to the left arm. EXAM: CT ANGIOGRAPHY CHEST WITH CONTRAST TECHNIQUE: Multidetector CT imaging of the chest was performed using the standard protocol during bolus administration of intravenous contrast. Multiplanar CT image reconstructions and MIPs were obtained to evaluate the vascular anatomy. CONTRAST:  136m ISOVUE-370 IOPAMIDOL (ISOVUE-370) INJECTION 76% COMPARISON:  CXR 06/04/2018 FINDINGS: Cardiovascular: Satisfactory opacification of the pulmonary arteries to the segmental level. No evidence of pulmonary embolism. Normal heart size. No pericardial effusion. Aortic atherosclerosis without aneurysm. Atherosclerosis of the great vessels without acute abnormality. Left-sided pacemaker apparatus with leads in the right atrium right ventricle are identified, best visualized on the scout view. Mediastinum/Nodes: No enlarged mediastinal, hilar, or axillary lymph nodes. Thyroid gland, trachea, and esophagus demonstrate no significant findings. Lungs/Pleura: Passive atelectasis and subsegmental atelectasis, greatest at the left lung base. A few tiny subpleural blebs are noted at the right lung apex. No pneumothorax or dominant mass. No focal pulmonary consolidation. Upper Abdomen: No acute abnormality. Musculoskeletal: No chest wall abnormality. No acute or  significant osseous findings. Review of the MIP images confirms the above findings. IMPRESSION: 1. No acute pulmonary embolus, aortic aneurysm or dissection. 2. Passive atelectasis and subsegmental atelectasis, greatest at the left lung base. Aortic Atherosclerosis (ICD10-I70.0). Electronically Signed   By: DAshley RoyaltyM.D.   On: 06/04/2018 20:08    Assessment and Plan:   1. Chest pain, CAD s/p PCI with DES to RCA 2016 and 100% stenosis of the 1st diagonal treated medically - pt presents with chest pain, unclear if this is chest pain at rest or exertional chest pain relieved with rest - he states he is able to walk his dogs without chest pain - only one troponin drawn so far: negative - EKG with paced rhythm - he has not had an ischemic evaluation since heart cath in 2016   2. Afib s/p AV nodal ablation and PPM in place - obtain interrogation   3. Chronic systolic and diastolic heart failure - echo 2016 with EF 35-40% - pt does not appear volume overloaded       For questions or updates, please contact CLucasPlease consult [email or web address] for contact info under     Signed, ALedora Bottcher PA  06/05/2018 4:28 PM   Pt seen and examined   I agree with findings as noted by A [facility] above  Pt is a 68yo with hx of CAD    Presents wit hCP    The history I get from patient is that he has CP over past couple months    Pain is worse with walking   He denies at rest      Says over the past few days he has noticed it more   Worsening     Over past few wks has has occasional PND     Discomfort is not positional or pleuritic     Hx also of atrial fibrillatoin  (s/p AV node ablation and PPM)   Also a hsitory of chronic systolic CHF     Denies change in diet   No edema  ON exam, pt in NAD Neck:  JVP is normal LUngs are CTA    Cardiac RRR   No S3   No signif murmurs  Chest is nontender Abd is supple   No hepatomegaly  Ext are without edema   2+ pulses  EKG is atrial  fibrillation with pacing   Impression Hx of CP with known CAD    I have reivewed with pt    Discussed stress test and cardiac cath   He says that one time stress test was normal and he had a problem  Discussed L heart cath   Pt understands risks   Agrees to proceed.  Will place on cath schedule.     Dorris Carnes MD

## 2018-06-05 NOTE — ED Notes (Signed)
Patient given turkey sandwich and PO fluids.  

## 2018-06-05 NOTE — ED Notes (Signed)
ED TO INPATIENT HANDOFF REPORT  ED Nurse Name and Phone #:  Joellen Jersey [phone]  S Name/Age/Gender [name] Cook 68 y.o. male Room/Bed: H019C/H019C  Code Status   Code Status: Full Code  Home/SNF/Other Home Patient oriented to: self, place, time and situation Is this baseline? Yes   Triage Complete: Triage complete  Chief Complaint Chest Pain   Triage Note Pt from home via ems; c/o L sided cp that began last night, radiates to L arm; described as sharp; hx MI; pt has pacemaker; intermittent nausea associated; pale and diaphoretic on ems arrival; 6/10; increases with inspiration; initial pressure 80/40, pt given 400 mL NS; pt give 324 ASA PTA, no nitro given; pt called PCP this afternoon, recommended pt come to ED for evaluation; pt also c/o abdominal tenderness; pt on lasix but is unsure why   P 96 vent paced 90% RA - placed on 4L o2 99% 110/70 CBG 218 RR 16 T 97.40F temporal   Allergies No Known Allergies  Level of Care/Admitting Diagnosis ED Disposition    ED Disposition Condition [place]: [place] [[id number]]  Level of Care: Medical Telemetry [104]  I expect the patient will be discharged within 24 hours: Yes  LOW acuity---Tx typically complete <24 hrs---ACUTE conditions typically can be evaluated <24 hours---LABS likely to return to acceptable levels <24 hours---IS near functional baseline---EXPECTED to return to current living arrangement---NOT newly hypoxic: Meets criteria for 5C-Observation unit  Diagnosis: Chest pain [009381]  Admitting Physician: Rhetta Mura [[id number]]  Attending Physician: Rhetta Mura [[id number]]  PT Class (Do Not Modify): Observation [104]  PT Acc Code (Do Not Modify): Observation [10022]       B Medical/Surgery History Past Medical History:  Diagnosis Date  . Arthritis    "knees and lower back" (03/14/2013)  . Atrial flutter ([place])    radiofrequency ablation in 2001  . CAD (coronary  artery disease)    a. Nonobstructive. Cardiac cath in 2001-50% mid RI, normal LM, LAD, RCA b. cath 10/16/2014 95% mid RCA treated with DES, 99% ost D1 medical management due to small aneurysmal segment  . Chronic anticoagulation    chronic Coumadin anticoagulation  . Chronic obstructive pulmonary disease ([place]) 04/20/2011  . Diabetes mellitus, type 2 ([place])   . GERD (gastroesophageal reflux disease)   . Hyperlipidemia   . Hypertension    with hypertensive heart disease  . Obesity   . Persistent atrial fibrillation    recurrent atrial flutter since 2001 s/p DCCVs, multiple failed AADs, h/o tachy-mediated cardiomyopathy  . Shortness of breath    "can come on at any time" (03/14/2013)  . Sleep apnea    "dx'd; couldn't wear the mask" (03/14/2013)  . Tobacco abuse    Past Surgical History:  Procedure Laterality Date  . ATRIAL FLUTTER ABLATION  2002   atrial flutter; subsequently developed atrial fibrillation  . AV NODE ABLATION  01/24/2013  . CARDIAC CATHETERIZATION  2002  . CARDIAC CATHETERIZATION N/A 10/16/2014   Procedure: Left Heart Cath and Coronary Angiography;  Surgeon: [name], MD;  Location: [facility];  Service: Cardiovascular;  Laterality: N/A;  . CARDIOVERSION  05/31/2011   Procedure: CARDIOVERSION;  Surgeon: Cristopher Estimable. Lattie Haw, MD;  Location: [facility];  Service: Cardiovascular;  Laterality: N/A;  . CARPAL TUNNEL RELEASE Left 1980's  . INSERT / REPLACE / REMOVE PACEMAKER  01/24/2013    Medtronic Adapta L dual-chamber pacemaker, serial number [id number]   . LEFT  HEART CATHETERIZATION WITH CORONARY ANGIOGRAM N/A 03/17/2013   Procedure: LEFT HEART CATHETERIZATION WITH CORONARY ANGIOGRAM;  Surgeon: Burnell Blanks, MD; LAD mild dz, D1 branch 100%, inferior branch 99%, CFX OK, RCA 50%, EF 65%    . LOOP RECORDER IMPLANT  2002  . PERMANENT PACEMAKER INSERTION N/A 01/24/2013   Procedure: PERMANENT PACEMAKER INSERTION;  Surgeon: [name], MD;  Location: [facility] CATH LAB;   Service: Cardiovascular;  Laterality: N/A;  . TIBIAL TUBERCLERPLASTY  ~ 2003     A IV Location/Drains/Wounds Patient Lines/Drains/Airways Status   Active Line/Drains/Airways    Name:   Placement date:   Placement time:   Site:   Days:   Peripheral IV 06/04/18 Left Antecubital   06/04/18    -    Antecubital   1   Peripheral IV 06/04/18 Right Antecubital   06/04/18    1820    Antecubital   1   Incision 01/24/13 Chest Left   01/24/13    1500     1958          Intake/Output Last 24 hours  Intake/Output Summary (Last 24 hours) at 06/05/2018 0721 Last data filed at 06/04/2018 1856 Gross per 24 hour  Intake 500 ml  Output -  Net 500 ml    Labs/Imaging Results for orders placed or performed during the hospital encounter of 06/04/18 (from the past 48 hour(s))  Basic metabolic panel     Status: Abnormal   Collection Time: 06/04/18  6:00 PM  Result Value Ref Range   Sodium 139 135 - 145 mmol/L   Potassium 4.0 3.5 - 5.1 mmol/L   Chloride 104 98 - 111 mmol/L   CO2 22 22 - 32 mmol/L   Glucose, Bld 177 (H) 70 - 99 mg/dL   BUN 18 8 - 23 mg/dL   Creatinine, Ser 1.23 0.61 - 1.24 mg/dL   Calcium 8.7 (L) 8.9 - 10.3 mg/dL   GFR calc non Af Amer >60 >60 mL/min   GFR calc Af Amer >60 >60 mL/min   Anion gap 13 5 - 15    Comment: Performed at [facility], [place] [address], [place], [place] [address]  CBC     Status: None   Collection Time: 06/04/18  6:00 PM  Result Value Ref Range   WBC 8.2 4.0 - 10.5 K/uL   RBC 4.49 4.22 - 5.81 MIL/uL   Hemoglobin 14.3 13.0 - 17.0 g/dL   HCT 44.3 39.0 - 52.0 %   MCV 98.7 80.0 - 100.0 fL   MCH 31.8 26.0 - 34.0 pg   MCHC 32.3 30.0 - 36.0 g/dL   RDW 13.8 11.5 - 15.5 %   Platelets  150 - 400 K/uL    PLATELET CLUMPS NOTED ON SMEAR, COUNT APPEARS DECREASED    Comment: Immature Platelet Fraction may be clinically indicated, consider ordering this additional test [id number]    nRBC 0.0 0.0 - 0.2 %    Comment: Performed at [facility],  [place] [address], [place], [place] [address]  Hepatic function panel     Status: Abnormal   Collection Time: 06/04/18  6:22 PM  Result Value Ref Range   Total Protein 6.0 (L) 6.5 - 8.1 g/dL   Albumin 3.2 (L) 3.5 - 5.0 g/dL   AST 18 15 - 41 U/L   ALT 14 0 - 44 U/L   Alkaline Phosphatase 43 38 - 126 U/L   Total Bilirubin 1.1 0.3 - 1.2 mg/dL  Bilirubin, Direct 0.2 0.0 - 0.2 mg/dL   Indirect Bilirubin 0.9 0.3 - 0.9 mg/dL    Comment: Performed at [facility] [address] [place], [place] [address]  Protime-INR     Status: Abnormal   Collection Time: 06/04/18  6:22 PM  Result Value Ref Range   Prothrombin Time 28.7 (H) 11.4 - 15.2 seconds   INR 2.75     Comment: Performed at [facility] [address], [place], [place] [address]  I-stat troponin, ED     Status: None   Collection Time: 06/04/18  6:35 PM  Result Value Ref Range   Troponin i, poc 0.00 0.00 - 0.08 ng/mL   Comment 3            Comment: Due to the release kinetics of cTnI, a negative result within the first hours of the onset of symptoms does not rule out myocardial infarction with certainty. If myocardial infarction is still suspected, repeat the test at appropriate intervals.   Magnesium     Status: None   Collection Time: 06/05/18  2:03 AM  Result Value Ref Range   Magnesium 1.8 1.7 - 2.4 mg/dL    Comment: Performed at [facility], [place] [address], [place], [place] [address]  Basic metabolic panel     Status: Abnormal   Collection Time: 06/05/18  2:03 AM  Result Value Ref Range   Sodium 138 135 - 145 mmol/L   Potassium 4.2 3.5 - 5.1 mmol/L   Chloride 105 98 - 111 mmol/L   CO2 24 22 - 32 mmol/L   Glucose, Bld 103 (H) 70 - 99 mg/dL   BUN 15 8 - 23 mg/dL   Creatinine, Ser 1.11 0.61 - 1.24 mg/dL   Calcium 9.0 8.9 - 10.3 mg/dL   GFR calc non Af Amer >60 >60 mL/min   GFR calc Af Amer >60 >60 mL/min   Anion gap 9 5 - 15    Comment: Performed at [facility] [address], [place],  [place] [address]  CBC     Status: Abnormal   Collection Time: 06/05/18  2:03 AM  Result Value Ref Range   WBC 7.8 4.0 - 10.5 K/uL   RBC 4.56 4.22 - 5.81 MIL/uL   Hemoglobin 14.6 13.0 - 17.0 g/dL   HCT 45.0 39.0 - 52.0 %   MCV 98.7 80.0 - 100.0 fL   MCH 32.0 26.0 - 34.0 pg   MCHC 32.4 30.0 - 36.0 g/dL   RDW 14.0 11.5 - 15.5 %   Platelets 108 (L) 150 - 400 K/uL    Comment: REPEATED TO VERIFY PLATELET COUNT CONFIRMED BY SMEAR Immature Platelet Fraction may be clinically indicated, consider ordering this additional test [id number]    nRBC 0.0 0.0 - 0.2 %    Comment: Performed at [facility], [place] [address], [place], [place] [address]  Troponin I - Once     Status: None   Collection Time: 06/05/18  2:03 AM  Result Value Ref Range   Troponin I <0.03 <0.03 ng/mL    Comment: Performed at [facility] [address], [place],  [address]  Protime-INR     Status: Abnormal   Collection Time: 06/05/18  2:03 AM  Result Value Ref Range   Prothrombin Time 28.7 (H) 11.4 - 15.2 seconds   INR 2.75     Comment: Performed at [place],  [place] [address]   Dg Chest 2 View  Result Date: 06/04/2018 CLINICAL DATA:  Chest pain EXAM: CHEST - 2 VIEW COMPARISON:  03/10/2016 FINDINGS: Dual lead pacemaker unchanged. Negative for heart failure. Lungs clear without infiltrate or effusion. IMPRESSION: No active cardiopulmonary disease. Electronically Signed   By: Franchot Gallo M.D.   On: 06/04/2018 19:08   Ct Angio Chest Pe W And/or Wo Contrast  Result Date: 06/04/2018 CLINICAL DATA:  Left-sided chest pain beginning last night radiating to the left arm. EXAM: CT ANGIOGRAPHY CHEST WITH CONTRAST TECHNIQUE: Multidetector CT imaging of the chest was performed using the standard protocol during bolus administration of intravenous contrast. Multiplanar CT image reconstructions and MIPs were obtained to evaluate the vascular anatomy. CONTRAST:  126mL ISOVUE-370 IOPAMIDOL  (ISOVUE-370) INJECTION 76% COMPARISON:  CXR 06/04/2018 FINDINGS: Cardiovascular: Satisfactory opacification of the pulmonary arteries to the segmental level. No evidence of pulmonary embolism. Normal heart size. No pericardial effusion. Aortic atherosclerosis without aneurysm. Atherosclerosis of the great vessels without acute abnormality. Left-sided pacemaker apparatus with leads in the right atrium right ventricle are identified, best visualized on the scout view. Mediastinum/Nodes: No enlarged mediastinal, hilar, or axillary lymph nodes. Thyroid gland, trachea, and esophagus demonstrate no significant findings. Lungs/Pleura: Passive atelectasis and subsegmental atelectasis, greatest at the left lung base. A few tiny subpleural blebs are noted at the right lung apex. No pneumothorax or dominant mass. No focal pulmonary consolidation. Upper Abdomen: No acute abnormality. Musculoskeletal: No chest wall abnormality. No acute or significant osseous findings. Review of the MIP images confirms the above findings. IMPRESSION: 1. No acute pulmonary embolus, aortic aneurysm or dissection. 2. Passive atelectasis and subsegmental atelectasis, greatest at the left lung base. Aortic Atherosclerosis (ICD10-I70.0). Electronically Signed   By: Ashley Royalty M.D.   On: 06/04/2018 20:08    Pending Labs Unresulted Labs (From admission, onward)    Start     Ordered   06/05/18 0500  HIV antibody (Routine Testing)  Tomorrow morning,   R     06/05/18 0029   06/05/18 0500  Protime-INR  Daily,   R     06/05/18 0043   06/04/18 1823  Gastrointestinal Panel by PCR , Stool  (Gastrointestinal Panel by PCR, Stool)  Once,   R     06/04/18 1822   06/04/18 1823  C difficile quick scan w PCR reflex  (C Difficile quick screen w PCR reflex panel)  Once, for 24 hours,   R     06/04/18 1822          Vitals/Pain Today's Vitals   06/05/18 0002 06/05/18 0430 06/05/18 0449 06/05/18 0720  BP:   123/77   Pulse:      Resp:   16   Temp:    98.2 F (36.8 C)   TempSrc:   Oral   SpO2:   96%   Weight:      Height:      PainSc: 0-No pain 7   0-No pain    Isolation Precautions No active isolations  Medications Medications  metoprolol succinate (TOPROL-XL) 24 hr tablet 25 mg (has no administration in time range)  isosorbide mononitrate (IMDUR) 24 hr tablet 30 mg (has no administration in time range)  lisinopril (PRINIVIL,ZESTRIL) tablet 20 mg (has no administration in time range)  nitroGLYCERIN (NITROSTAT) SL tablet 0.4 mg (has no administration in time range)  rosuvastatin (CRESTOR) tablet 20 mg (20 mg Oral Given 06/05/18 0443)  buPROPion (ZYBAN) 12 hr tablet 150 mg (has no administration in  time range)  escitalopram (LEXAPRO) tablet 10 mg (has no administration in time range)  pantoprazole (PROTONIX) EC tablet 40 mg (has no administration in time range)  clopidogrel (PLAVIX) tablet 75 mg (has no administration in time range)  gabapentin (NEURONTIN) capsule 300 mg (has no administration in time range)  acetaminophen (TYLENOL) tablet 650 mg (650 mg Oral Given 06/05/18 0440)    Or  acetaminophen (TYLENOL) suppository 650 mg ( Rectal See Alternative 06/05/18 0440)  ondansetron (ZOFRAN) tablet 4 mg (has no administration in time range)    Or  ondansetron (ZOFRAN) injection 4 mg (has no administration in time range)  insulin aspart (novoLOG) injection 0-15 Units (has no administration in time range)  sodium chloride flush (NS) 0.9 % injection 3 mL (3 mLs Intravenous Given 06/04/18 1834)  sodium chloride 0.9 % bolus 500 mL (0 mLs Intravenous Stopped 06/04/18 1856)  iopamidol (ISOVUE-370) 76 % injection 100 mL (100 mLs Intravenous Contrast Given 06/04/18 1944)    Mobility walks     Focused Assessments Cardiac Assessment Handoff:  Cardiac Rhythm: Normal sinus rhythm Lab Results  Component Value Date   CKTOTAL 86 01/22/2013   CKMB 0.7 01/22/2013   TROPONINI <0.03 06/05/2018   No results found for: DDIMER Does the  Patient currently have chest pain? No     R Recommendations: See Admitting Provider Note  Report given to:   Additional Notes:  Patient ambulates to bathroom without assistance. Denies chest pain at this time.

## 2018-06-05 NOTE — Progress Notes (Addendum)
Progress Note    [name] Cook  GBT:[id number] DOB: 1950/08/26  DOA: 06/04/2018 PCP: [name], NP    Brief Narrative:   Chief complaint: Chest pain  Medical records reviewed and are as summarized below:  [name] Cook is an 67 y.o. male past medical history of HTN, HLD, PAF, on Coumadin, CAD s/p stent in 2016; who presented with complaints of 3 days of intermittent chest pressure with exertion.  Assessment/Plan:   Principal Problem:   Chest pain Active Problems:   Chronic anticoagulation   Atrial fibrillation   Hypertension   Diabetes mellitus type 2, uncomplicated (HCC)  Chest pain, history of coronary artery disease: Acute.  Symptoms resolved with rest.  Patient with previous history of coronary artery disease requiring placement of drug-eluting stent to RCA in 2016. and heart score = 5.  Troponins negative thus far and EKG showing paced rhythm with possible background atrial fibrillation. CT angiogram negative for any signs of a PE with atherosclerosis of the great vessels without signs of acute abnormality. Patient followed in outpatient setting by [name]. -Continue  aspirin 81 mg and Plavix -Nitroglycerin as needed -Continue beta-blocker and statin -Formal consult to cardiology placed, will follow-up for further recommendation  Paroxysmal atrial fibrillation s/p PM on chronic anticoagulation: INR therapeutic at 2.75. -Continue Coumadin per pharmacy -Will stop Coumadin depending on plan of action   Combined congestive and diastolic heart failure: Patient appears to be euvolemic at this time.  Last EF noted to be 35 to 40% in 2016. -Strict intake and output  -Daily weight  Essential hypertension: Patient was initially noted to have low blood pressures soft blood pressures, but improved to 125/104 with IV fluids. -Continue metoprolol and lisinopril as tolerated -Stop IV fluids  Diabetes mellitus type 2:Complications include peripheral neuropathy.  Home  medications include metformin, Januvia, and gabapentin. -Hypoglycemic protocol -Hold metformin and Januvia -CBGs q. before meals and bedtime with SSI -Continue gabapentin  Hyperlipidemia: Patient without recent lipid panel. -Check lipid panel -Continue statin   COPD, without acute exacerbation: Stable -Breathing treatments as needed  Prostate cancer: Patient recently evaluated by radiation oncology last week for treatment options and Dr. Noreene Filbert. -Will need continue outpatient follow-up  GERD -Continue Protonix  Tobacco abuse  Body mass index is 31.15 kg/m.   Family Communication/Anticipated D/C date and plan/Code Status   DVT prophylaxis:   Code Status: Full Code.  Family Communication: No family present at bedside Disposition Plan: Possible discharge home in 1 to 2 days after evaluation by cardiology   Medical Consultants:    Cardiology   Anti-Infectives:    None  Subjective:   Patient reports that he has been having shortness of breath with exertion that goes away with rest.  He did  Objective:    Vitals:   06/04/18 2130 06/05/18 0449 06/05/18 0801 06/05/18 1236  BP: 104/74 123/77 119/80 104/64  Pulse: 73  71 71  Resp: 15 16 18 18   Temp:  98.2 F (36.8 C) 97.7 F (36.5 C) (!) 97.5 F (36.4 C)  TempSrc:  Oral Oral Oral  SpO2: 96% 96% 96% 96%  Weight:   101.3 kg   Height:   5\' 11"  (1.803 m)     Intake/Output Summary (Last 24 hours) at 06/05/2018 1422 Last data filed at 06/04/2018 1856 Gross per 24 hour  Intake 500 ml  Output -  Net 500 ml   Filed Weights   06/04/18 1753 06/05/18 0801  Weight: 99.8 kg  101.3 kg    Exam: Constitutional: Obese elderly male currently in NAD, calm, comfortable Eyes: PERRL, lids and conjunctivae normal ENMT: Mucous membranes are moist. Posterior pharynx clear of any exudate or lesions.  Neck: normal, supple, no masses, no thyromegaly.  No JVD Respiratory: clear to auscultation bilaterally, no  wheezing, no crackles. Normal respiratory effort. No accessory muscle use.  Cardiovascular: Regular rate and rhythm, no murmurs / rubs / gallops. No extremity edema. 2+ pedal pulses. No carotid bruits.  Abdomen: no tenderness, no masses palpated. No hepatosplenomegaly. Bowel sounds positive.  Musculoskeletal: no clubbing / cyanosis. No joint deformity upper and lower extremities. Good ROM, no contractures. Normal muscle tone.  Skin: no rashes, lesions, ulcers. No induration Neurologic: CN 2-12 grossly intact. Sensation intact, DTR normal. Strength 5/5 in all 4.  Psychiatric: Normal judgment and insight. Alert and oriented x 3. Normal mood.    Data Reviewed:   I have personally reviewed following labs and imaging studies:  Labs: Labs show the following:   Basic Metabolic Panel: Recent Labs  Lab 06/04/18 1800 06/05/18 0203  NA 139 138  K 4.0 4.2  CL 104 105  CO2 22 24  GLUCOSE 177* 103*  BUN 18 15  CREATININE 1.23 1.11  CALCIUM 8.7* 9.0  MG  --  1.8   GFR Estimated Creatinine Clearance: 79.4 mL/min (by C-G formula based on SCr of 1.11 mg/dL). Liver Function Tests: Recent Labs  Lab 06/04/18 1822  AST 18  ALT 14  ALKPHOS 43  BILITOT 1.1  PROT 6.0*  ALBUMIN 3.2*   No results for input(s): LIPASE, AMYLASE in the last 168 hours. No results for input(s): AMMONIA in the last 168 hours. Coagulation profile Recent Labs  Lab 06/04/18 1822 06/05/18 0203  INR 2.75 2.75    CBC: Recent Labs  Lab 06/04/18 1800 06/05/18 0203  WBC 8.2 7.8  HGB 14.3 14.6  HCT 44.3 45.0  MCV 98.7 98.7  PLT PLATELET CLUMPS NOTED ON SMEAR, COUNT APPEARS DECREASED 108*   Cardiac Enzymes: Recent Labs  Lab 06/05/18 0203  TROPONINI <0.03   BNP (last 3 results) No results for input(s): PROBNP in the last 8760 hours. CBG: Recent Labs  Lab 06/05/18 0807 06/05/18 1126  GLUCAP 97 107*   D-Dimer: No results for input(s): DDIMER in the last 72 hours. Hgb A1c: No results for input(s):  HGBA1C in the last 72 hours. Lipid Profile: No results for input(s): CHOL, HDL, LDLCALC, TRIG, CHOLHDL, LDLDIRECT in the last 72 hours. Thyroid function studies: No results for input(s): TSH, T4TOTAL, T3FREE, THYROIDAB in the last 72 hours.  Invalid input(s): FREET3 Anemia work up: No results for input(s): VITAMINB12, FOLATE, FERRITIN, TIBC, IRON, RETICCTPCT in the last 72 hours. Sepsis Labs: Recent Labs  Lab 06/04/18 1800 06/05/18 0203  WBC 8.2 7.8    Microbiology No results found for this or any previous visit (from the past 240 hour(s)).  Procedures and diagnostic studies:  Dg Chest 2 View  Result Date: 06/04/2018 CLINICAL DATA:  Chest pain EXAM: CHEST - 2 VIEW COMPARISON:  03/10/2016 FINDINGS: Dual lead pacemaker unchanged. Negative for heart failure. Lungs clear without infiltrate or effusion. IMPRESSION: No active cardiopulmonary disease. Electronically Signed   By: Franchot Gallo M.D.   On: 06/04/2018 19:08   Ct Angio Chest Pe W And/or Wo Contrast  Result Date: 06/04/2018 CLINICAL DATA:  Left-sided chest pain beginning last night radiating to the left arm. EXAM: CT ANGIOGRAPHY CHEST WITH CONTRAST TECHNIQUE: Multidetector CT imaging of the chest  was performed using the standard protocol during bolus administration of intravenous contrast. Multiplanar CT image reconstructions and MIPs were obtained to evaluate the vascular anatomy. CONTRAST:  155mL ISOVUE-370 IOPAMIDOL (ISOVUE-370) INJECTION 76% COMPARISON:  CXR 06/04/2018 FINDINGS: Cardiovascular: Satisfactory opacification of the pulmonary arteries to the segmental level. No evidence of pulmonary embolism. Normal heart size. No pericardial effusion. Aortic atherosclerosis without aneurysm. Atherosclerosis of the great vessels without acute abnormality. Left-sided pacemaker apparatus with leads in the right atrium right ventricle are identified, best visualized on the scout view. Mediastinum/Nodes: No enlarged mediastinal, hilar, or  axillary lymph nodes. Thyroid gland, trachea, and esophagus demonstrate no significant findings. Lungs/Pleura: Passive atelectasis and subsegmental atelectasis, greatest at the left lung base. A few tiny subpleural blebs are noted at the right lung apex. No pneumothorax or dominant mass. No focal pulmonary consolidation. Upper Abdomen: No acute abnormality. Musculoskeletal: No chest wall abnormality. No acute or significant osseous findings. Review of the MIP images confirms the above findings. IMPRESSION: 1. No acute pulmonary embolus, aortic aneurysm or dissection. 2. Passive atelectasis and subsegmental atelectasis, greatest at the left lung base. Aortic Atherosclerosis (ICD10-I70.0). Electronically Signed   By: Ashley Royalty M.D.   On: 06/04/2018 20:08    Medications:   . [START ON 06/06/2018] buPROPion  150 mg Oral BID  . clopidogrel  75 mg Oral Daily  . escitalopram  10 mg Oral Daily  . gabapentin  300 mg Oral TID  . insulin aspart  0-15 Units Subcutaneous TID WC  . isosorbide mononitrate  30 mg Oral Daily  . lisinopril  20 mg Oral Daily  . metoprolol succinate  25 mg Oral Daily  . pantoprazole  40 mg Oral Daily  . rosuvastatin  20 mg Oral QPM  . warfarin  2.5 mg Oral ONCE-1800  . Warfarin - Pharmacist Dosing Inpatient   Does not apply q1800   Continuous Infusions: . sodium chloride 75 mL/hr at 06/05/18 0920     LOS: 0 days    A   [facility]   *Please refer to [email or web address], password [email or web address] to get updated schedule on who will round on this patient, as hospitalists switch teams weekly. If 7PM-7AM, please contact night-coverage at [email or web address], password [email or web address] for any overnight needs.

## 2018-06-05 NOTE — Progress Notes (Signed)
ANTICOAGULATION CONSULT NOTE - Initial Consult  Pharmacy Consult for coumadin Indication: atrial fibrillation  No Known Allergies  Patient Measurements: Height: 5\' 11"  (180.3 cm) Weight: 223 lb 5.2 oz (101.3 kg)(bed scale) IBW/kg (Calculated) : 75.3   Vital Signs: Temp: 97.7 F (36.5 C) (02/19 0801) Temp Source: Oral (02/19 0801) BP: 119/80 (02/19 0801) Pulse Rate: 71 (02/19 0801)  Labs: Recent Labs    06/04/18 1800 06/04/18 1822 06/05/18 0203  HGB 14.3  --  14.6  HCT 44.3  --  45.0  PLT PLATELET CLUMPS NOTED ON SMEAR, COUNT APPEARS DECREASED  --  108*  LABPROT  --  28.7* 28.7*  INR  --  2.75 2.75  CREATININE 1.23  --  1.11  TROPONINI  --   --  <0.03    Estimated Creatinine Clearance: 79.4 mL/min (by C-G formula based on SCr of 1.11 mg/dL).   Medical History: Past Medical History:  Diagnosis Date  . Arthritis    "knees and lower back" (03/14/2013)  . Atrial flutter ([place])    radiofrequency ablation in 2001  . CAD (coronary artery disease)    a. Nonobstructive. Cardiac cath in 2001-50% mid RI, normal LM, LAD, RCA b. cath 10/16/2014 95% mid RCA treated with DES, 99% ost D1 medical management due to small aneurysmal segment  . Chronic anticoagulation    chronic Coumadin anticoagulation  . Chronic obstructive pulmonary disease ([place]) 04/20/2011  . Diabetes mellitus, type 2 ([place])   . GERD (gastroesophageal reflux disease)   . Hyperlipidemia   . Hypertension    with hypertensive heart disease  . Obesity   . Persistent atrial fibrillation    recurrent atrial flutter since 2001 s/p DCCVs, multiple failed AADs, h/o tachy-mediated cardiomyopathy  . Shortness of breath    "can come on at any time" (03/14/2013)  . Sleep apnea    "dx'd; couldn't wear the mask" (03/14/2013)  . Tobacco abuse     Medications:  See medication history Coumadin 6mg  on Monday, 2.5 mg other days per med rec  Assessment: 68 yo man to continue coumadin for afib.  Last dose 2/18 in the   morning.  INR 2.75 on 2/18 and now again on 2/19.  Goal of Therapy:  INR 2-3 Monitor platelets by anticoagulation protocol: Yes   Plan:  Continue 2.5mg  PO x 1 tonight per home dose.  Daily PT/INR Monitor for bleeding complications  Summar Mcglothlin A. Levada Dy, PharmD, [place] Please utilize Amion for appropriate phone number to reach the unit pharmacist (Mariano Colon)    06/05/2018,8:12 AM

## 2018-06-05 NOTE — Progress Notes (Signed)
Brief note regarding plan, with full H&P to follow:   [name] Cook is a 68 y.o. male with medical history significant for coronary artery disease coronary angiography in July 2016 revealing 95% stenosis of mid RCA status post drug-eluting stent x1 and 99% stenosis of first diagonal, hypertension, hyperlipidemia, type 2 diabetes mellitus, and paroxysmal atrial fibrillation chronically anticoagulated on warfarin, who is admitted to [facility] on 06/04/2018 for further evaluation and management of presenting chest pain.   #) Chest pain: The patient presents with 3 days of intermittent substernal chest pressure that occurs with exertion and improves with rest, although he has not received any nitroglycerin over that time.  Given the presence of typical features and multiple risk factors for progression of known underlying coronary artery disease, the patient is admitted for overnight observation for ACS rule out.  This is also in the context of a heart score of 5, conferring moderate probability of risk for major adverse cardiac event over the ensuing 6 weeks.  Should the patient subsequently ruled out for ACS, can consider discussions with cardiology regarding recommendations for the need/nature of additional ischemic evaluation.  Of note, presenting EKG demonstrates no evidence of acute ischemic changes and initial troponin has been found to be negative.  The patient is currently chest pain-free.  Additionally, CT of the chest performed today showed no evidence of acute pulmonary embolism or other acute process.  Full dose aspirin was administered via EMS this evening.  Plan: I have ordered patient to receive dose of his home rosuvastatin now for plaque stabilization qualities.  Continue home beta-blocker.  PRN sublingual nitroglycerin for subsequent chest discomfort.  PRN EKG for any subsequent chest discomfort.  Monitor on telemetry.  Trend serial troponin.  Consider discussion with cardiology  regarding need for additional ischemic evaluation should the patient rule out for ACS, as further described above.    Babs Bertin, DO Hospitalist

## 2018-06-05 NOTE — ED Notes (Signed)
Patient moved to hospital bed. Explained delay and patient resting comfortably.

## 2018-06-06 ENCOUNTER — Observation Stay (HOSPITAL_BASED_OUTPATIENT_CLINIC_OR_DEPARTMENT_OTHER): Payer: Medicare Other

## 2018-06-06 ENCOUNTER — Encounter (HOSPITAL_COMMUNITY)
Admission: EM | Disposition: A | Discharge status: Home / Self Care | Payer: Self-pay | Source: Home / Self Care | Attending: Emergency Medicine

## 2018-06-06 DIAGNOSIS — R079 Chest pain, unspecified: Secondary | ICD-10-CM | POA: Diagnosis not present

## 2018-06-06 DIAGNOSIS — I10 Essential (primary) hypertension: Secondary | ICD-10-CM | POA: Diagnosis not present

## 2018-06-06 DIAGNOSIS — E119 Type 2 diabetes mellitus without complications: Secondary | ICD-10-CM | POA: Diagnosis not present

## 2018-06-06 DIAGNOSIS — I2510 Atherosclerotic heart disease of native coronary artery without angina pectoris: Secondary | ICD-10-CM | POA: Diagnosis not present

## 2018-06-06 DIAGNOSIS — Z7901 Long term (current) use of anticoagulants: Secondary | ICD-10-CM | POA: Diagnosis not present

## 2018-06-06 LAB — LIPID PANEL
Cholesterol: 130 mg/dL (ref 0–200)
HDL: 42 mg/dL (ref >40)
LDL Cholesterol: 71 mg/dL (ref 0–99)
Total CHOL/HDL Ratio: 3.1 RATIO
Triglycerides: 87 mg/dL (ref <150)
VLDL: 17 mg/dL (ref 0–40)

## 2018-06-06 LAB — GLUCOSE, CAPILLARY
Glucose-Capillary: 120 mg/dL — ABNORMAL HIGH (ref 70–99)
Glucose-Capillary: 115 mg/dL — ABNORMAL HIGH (ref 70–99)
Glucose-Capillary: 94 mg/dL (ref 70–99)
Glucose-Capillary: 86 mg/dL (ref 70–99)
Glucose-Capillary: 171 mg/dL — ABNORMAL HIGH (ref 70–99)

## 2018-06-06 LAB — ECHOCARDIOGRAM COMPLETE
Height: 71 in
Weight: 3534.41 oz

## 2018-06-06 LAB — PROTIME-INR
INR: 2.31
Prothrombin Time: 25 seconds — ABNORMAL HIGH (ref 11.4–15.2)

## 2018-06-06 SURGERY — LEFT HEART CATH AND CORONARY ANGIOGRAPHY
Anesthesia: LOCAL

## 2018-06-06 MED ORDER — LISINOPRIL 20 MG PO TABS
20.0000 mg | ORAL_TABLET | Freq: Every day | ORAL | Status: DC
  Filled 2018-06-06 (×2): qty 1

## 2018-06-06 MED ORDER — SODIUM CHLORIDE 0.9 % WEIGHT BASED INFUSION: 1.0000 mL/kg/h | INTRAVENOUS | Status: DC

## 2018-06-06 MED ORDER — PHYTONADIONE 1 MG/0.5 ML ORAL SOLUTION
1.2500 mg | Freq: Once | ORAL | Status: AC
  Administered 2018-06-06: 1.25 mg via ORAL
  Filled 2018-06-06: qty 1

## 2018-06-06 MED ORDER — SODIUM CHLORIDE 0.9% FLUSH
3.0000 mL | Freq: Two times a day (BID) | INTRAVENOUS | Status: DC
  Administered 2018-06-07: 3 mL via INTRAVENOUS

## 2018-06-06 MED ORDER — SODIUM CHLORIDE 0.9% FLUSH: 3.0000 mL | INTRAVENOUS | Status: DC | PRN

## 2018-06-06 MED ORDER — SODIUM CHLORIDE 0.9 % IV SOLN: 250.0000 mL | INTRAVENOUS | Status: DC | PRN

## 2018-06-06 MED ORDER — ASPIRIN EC 81 MG PO TBEC
81.0000 mg | DELAYED_RELEASE_TABLET | Freq: Every day | ORAL | Status: DC
  Administered 2018-06-06: 81 mg via ORAL
  Filled 2018-06-06: qty 1

## 2018-06-06 MED ORDER — ASPIRIN 81 MG PO CHEW
81.0000 mg | CHEWABLE_TABLET | ORAL | Status: AC
  Administered 2018-06-07: 81 mg via ORAL
  Filled 2018-06-06: qty 1

## 2018-06-06 MED ORDER — PHYTONADIONE 5 MG PO TABS
1.2500 mg | ORAL_TABLET | Freq: Once | ORAL | Status: DC
  Filled 2018-06-06: qty 1

## 2018-06-06 MED ORDER — SODIUM CHLORIDE 0.9 % WEIGHT BASED INFUSION
3.0000 mL/kg/h | INTRAVENOUS | Status: DC
  Administered 2018-06-07: 3 mL/kg/h via INTRAVENOUS

## 2018-06-06 NOTE — Progress Notes (Signed)
Progress Note  Patient Name: [name] Cook Date of Encounter: 06/06/2018  Primary Cardiologist: [name], MD   Subjective   Deneis CP   Breathing is OK    Inpatient Medications    Scheduled Meds: . aspirin EC  81 mg Oral Daily  . buPROPion  150 mg Oral BID  . clopidogrel  75 mg Oral Daily  . escitalopram  10 mg Oral Daily  . gabapentin  300 mg Oral TID  . insulin aspart  0-15 Units Subcutaneous TID WC  . isosorbide mononitrate  30 mg Oral Daily  . lisinopril  20 mg Oral Daily  . metoprolol succinate  25 mg Oral Daily  . pantoprazole  40 mg Oral Daily  . rosuvastatin  20 mg Oral QPM   Continuous Infusions:  PRN Meds: acetaminophen **OR** acetaminophen, nitroGLYCERIN, ondansetron **OR** ondansetron (ZOFRAN) IV   Vital Signs    Vitals:   06/05/18 0801 06/05/18 1236 06/05/18 2313 06/06/18 0631  BP: 119/80 104/64 125/68 125/74  Pulse: 71 71 92 70  Resp: 18 18 18 20   Temp: 97.7 F (36.5 C) (!) 97.5 F (36.4 C) 97.8 F (36.6 C) 97.7 F (36.5 C)  TempSrc: Oral Oral Oral Oral  SpO2: 96% 96% 95% 96%  Weight: 101.3 kg   100.2 kg  Height: 5\' 11"  (1.803 m)       Intake/Output Summary (Last 24 hours) at 06/06/2018 0750 Last data filed at 06/05/2018 1519 Gross per 24 hour  Intake 427.91 ml  Output -  Net 427.91 ml   Last 3 Weights 06/06/2018 06/05/2018 06/04/2018  Weight (lbs) 220 lb 14.4 oz 223 lb 5.2 oz 220 lb  Weight (kg) 100.2 kg 101.3 kg 99.791 kg      Telemetry    Afibv  Paced  - Personally Reviewed  ECG     Physical Exam   GEN: No acute distress.   Neck: No JVD Cardiac: RRR, no murmurs, rubs, or gallops.  Respiratory: Clear to auscultation bilaterally. GI: Soft, nontender, non-distended  MS: No edema; No deformity. Neuro:  Nonfocal  Psych: Normal affect   Labs    Chemistry Recent Labs  Lab 06/04/18 1800 06/04/18 1822 06/05/18 0203  NA 139  --  138  K 4.0  --  4.2  CL 104  --  105  CO2 22  --  24  GLUCOSE 177*  --  103*  BUN 18  --   15  CREATININE 1.23  --  1.11  CALCIUM 8.7*  --  9.0  PROT  --  6.0*  --   ALBUMIN  --  3.2*  --   AST  --  18  --   ALT  --  14  --   ALKPHOS  --  43  --   BILITOT  --  1.1  --   GFRNONAA >60  --  >60  GFRAA >60  --  >60  ANIONGAP 13  --  9     Hematology Recent Labs  Lab 06/04/18 1800 06/05/18 0203  WBC 8.2 7.8  RBC 4.49 4.56  HGB 14.3 14.6  HCT 44.3 45.0  MCV 98.7 98.7  MCH 31.8 32.0  MCHC 32.3 32.4  RDW 13.8 14.0  PLT PLATELET CLUMPS NOTED ON SMEAR, COUNT APPEARS DECREASED 108*    Cardiac Enzymes Recent Labs  Lab 06/05/18 0203  TROPONINI <0.03    Recent Labs  Lab 06/04/18 1835  TROPIPOC 0.00     BNPNo results for input(s): BNP,  PROBNP in the last 168 hours.   DDimer No results for input(s): DDIMER in the last 168 hours.   Radiology    Dg Chest 2 View  Result Date: 06/04/2018 CLINICAL DATA:  Chest pain EXAM: CHEST - 2 VIEW COMPARISON:  03/10/2016 FINDINGS: Dual lead pacemaker unchanged. Negative for heart failure. Lungs clear without infiltrate or effusion. IMPRESSION: No active cardiopulmonary disease. Electronically Signed   By: Franchot Gallo M.D.   On: 06/04/2018 19:08   Ct Angio Chest Pe W And/or Wo Contrast  Result Date: 06/04/2018 CLINICAL DATA:  Left-sided chest pain beginning last night radiating to the left arm. EXAM: CT ANGIOGRAPHY CHEST WITH CONTRAST TECHNIQUE: Multidetector CT imaging of the chest was performed using the standard protocol during bolus administration of intravenous contrast. Multiplanar CT image reconstructions and MIPs were obtained to evaluate the vascular anatomy. CONTRAST:  158mL ISOVUE-370 IOPAMIDOL (ISOVUE-370) INJECTION 76% COMPARISON:  CXR 06/04/2018 FINDINGS: Cardiovascular: Satisfactory opacification of the pulmonary arteries to the segmental level. No evidence of pulmonary embolism. Normal heart size. No pericardial effusion. Aortic atherosclerosis without aneurysm. Atherosclerosis of the great vessels without acute  abnormality. Left-sided pacemaker apparatus with leads in the right atrium right ventricle are identified, best visualized on the scout view. Mediastinum/Nodes: No enlarged mediastinal, hilar, or axillary lymph nodes. Thyroid gland, trachea, and esophagus demonstrate no significant findings. Lungs/Pleura: Passive atelectasis and subsegmental atelectasis, greatest at the left lung base. A few tiny subpleural blebs are noted at the right lung apex. No pneumothorax or dominant mass. No focal pulmonary consolidation. Upper Abdomen: No acute abnormality. Musculoskeletal: No chest wall abnormality. No acute or significant osseous findings. Review of the MIP images confirms the above findings. IMPRESSION: 1. No acute pulmonary embolus, aortic aneurysm or dissection. 2. Passive atelectasis and subsegmental atelectasis, greatest at the left lung base. Aortic Atherosclerosis (ICD10-I70.0). Electronically Signed   By: Ashley Royalty M.D.   On: 06/04/2018 20:08    Cardiac Studies     Patient Profile     68 y.o. male CAD s/p DES to RCA (2016) and 100% stenosis of D1 treated medically, atrial fibrillation on coumadin s/p AV node ablation with PPM in place, chronic systolic and diastolic heart failure, hypertension, HLD, DM, tobacco abuse, and COPD who is being seen today for the evaluation of chest pain at the request of Dr. Tamala Julian.  Assessment & Plan    1  CP   Pt has been pain free  Troponin remains negative    Will plan for L heart cath tomorrow  Cannot be done today  Note that pt on ASA, Plavix   Contniue for now   Hold coumadin   INR 2.3  2   Atrial fibrillation    S/P AV node ablation with PPM    Hold coumadin for now    3  Chronic systolic CHF    Echo 5409 :LVEF 35 to40%   Repeat echo ordered    Volume status is OK Continue meds    4  HTN   Follow BP on current regimen    5   HL  COntinue statin   For questions or updates, please contact [facility] Please consult [email or web address] for contact  info under        Signed, Dorris Carnes, MD  06/06/2018, 7:50 AM

## 2018-06-06 NOTE — Care Management Obs Status (Signed)
MEDICARE OBSERVATION STATUS NOTIFICATION   Patient Details  Name: [name] Cook MRN: [id number] Date of Birth: 11/02/1950   Medicare Observation Status Notification Given:  Yes    Carles Collet, RN 06/06/2018, 9:13 AM

## 2018-06-06 NOTE — Plan of Care (Signed)

## 2018-06-06 NOTE — H&P (View-Only) (Signed)
Progress Note  Patient Name: [name] Cook Date of Encounter: 06/06/2018  Primary Cardiologist: [name], MD   Subjective   Deneis CP   Breathing is OK    Inpatient Medications    Scheduled Meds: . aspirin EC  81 mg Oral Daily  . buPROPion  150 mg Oral BID  . clopidogrel  75 mg Oral Daily  . escitalopram  10 mg Oral Daily  . gabapentin  300 mg Oral TID  . insulin aspart  0-15 Units Subcutaneous TID WC  . isosorbide mononitrate  30 mg Oral Daily  . lisinopril  20 mg Oral Daily  . metoprolol succinate  25 mg Oral Daily  . pantoprazole  40 mg Oral Daily  . rosuvastatin  20 mg Oral QPM   Continuous Infusions:  PRN Meds: acetaminophen **OR** acetaminophen, nitroGLYCERIN, ondansetron **OR** ondansetron (ZOFRAN) IV   Vital Signs    Vitals:   06/05/18 0801 06/05/18 1236 06/05/18 2313 06/06/18 0631  BP: 119/80 104/64 125/68 125/74  Pulse: 71 71 92 70  Resp: 18 18 18 20   Temp: 97.7 F (36.5 C) (!) 97.5 F (36.4 C) 97.8 F (36.6 C) 97.7 F (36.5 C)  TempSrc: Oral Oral Oral Oral  SpO2: 96% 96% 95% 96%  Weight: 101.3 kg   100.2 kg  Height: 5\' 11"  (1.803 m)       Intake/Output Summary (Last 24 hours) at 06/06/2018 0750 Last data filed at 06/05/2018 1519 Gross per 24 hour  Intake 427.91 ml  Output -  Net 427.91 ml   Last 3 Weights 06/06/2018 06/05/2018 06/04/2018  Weight (lbs) 220 lb 14.4 oz 223 lb 5.2 oz 220 lb  Weight (kg) 100.2 kg 101.3 kg 99.791 kg      Telemetry    Afibv  Paced  - Personally Reviewed  ECG     Physical Exam   GEN: No acute distress.   Neck: No JVD Cardiac: RRR, no murmurs, rubs, or gallops.  Respiratory: Clear to auscultation bilaterally. GI: Soft, nontender, non-distended  MS: No edema; No deformity. Neuro:  Nonfocal  Psych: Normal affect   Labs    Chemistry Recent Labs  Lab 06/04/18 1800 06/04/18 1822 06/05/18 0203  NA 139  --  138  K 4.0  --  4.2  CL 104  --  105  CO2 22  --  24  GLUCOSE 177*  --  103*  BUN 18  --   15  CREATININE 1.23  --  1.11  CALCIUM 8.7*  --  9.0  PROT  --  6.0*  --   ALBUMIN  --  3.2*  --   AST  --  18  --   ALT  --  14  --   ALKPHOS  --  43  --   BILITOT  --  1.1  --   GFRNONAA >60  --  >60  GFRAA >60  --  >60  ANIONGAP 13  --  9     Hematology Recent Labs  Lab 06/04/18 1800 06/05/18 0203  WBC 8.2 7.8  RBC 4.49 4.56  HGB 14.3 14.6  HCT 44.3 45.0  MCV 98.7 98.7  MCH 31.8 32.0  MCHC 32.3 32.4  RDW 13.8 14.0  PLT PLATELET CLUMPS NOTED ON SMEAR, COUNT APPEARS DECREASED 108*    Cardiac Enzymes Recent Labs  Lab 06/05/18 0203  TROPONINI <0.03    Recent Labs  Lab 06/04/18 1835  TROPIPOC 0.00     BNPNo results for input(s): BNP,  PROBNP in the last 168 hours.   DDimer No results for input(s): DDIMER in the last 168 hours.   Radiology    Dg Chest 2 View  Result Date: 06/04/2018 CLINICAL DATA:  Chest pain EXAM: CHEST - 2 VIEW COMPARISON:  03/10/2016 FINDINGS: Dual lead pacemaker unchanged. Negative for heart failure. Lungs clear without infiltrate or effusion. IMPRESSION: No active cardiopulmonary disease. Electronically Signed   By: Franchot Gallo M.D.   On: 06/04/2018 19:08   Ct Angio Chest Pe W And/or Wo Contrast  Result Date: 06/04/2018 CLINICAL DATA:  Left-sided chest pain beginning last night radiating to the left arm. EXAM: CT ANGIOGRAPHY CHEST WITH CONTRAST TECHNIQUE: Multidetector CT imaging of the chest was performed using the standard protocol during bolus administration of intravenous contrast. Multiplanar CT image reconstructions and MIPs were obtained to evaluate the vascular anatomy. CONTRAST:  127mL ISOVUE-370 IOPAMIDOL (ISOVUE-370) INJECTION 76% COMPARISON:  CXR 06/04/2018 FINDINGS: Cardiovascular: Satisfactory opacification of the pulmonary arteries to the segmental level. No evidence of pulmonary embolism. Normal heart size. No pericardial effusion. Aortic atherosclerosis without aneurysm. Atherosclerosis of the great vessels without acute  abnormality. Left-sided pacemaker apparatus with leads in the right atrium right ventricle are identified, best visualized on the scout view. Mediastinum/Nodes: No enlarged mediastinal, hilar, or axillary lymph nodes. Thyroid gland, trachea, and esophagus demonstrate no significant findings. Lungs/Pleura: Passive atelectasis and subsegmental atelectasis, greatest at the left lung base. A few tiny subpleural blebs are noted at the right lung apex. No pneumothorax or dominant mass. No focal pulmonary consolidation. Upper Abdomen: No acute abnormality. Musculoskeletal: No chest wall abnormality. No acute or significant osseous findings. Review of the MIP images confirms the above findings. IMPRESSION: 1. No acute pulmonary embolus, aortic aneurysm or dissection. 2. Passive atelectasis and subsegmental atelectasis, greatest at the left lung base. Aortic Atherosclerosis (ICD10-I70.0). Electronically Signed   By: Ashley Royalty M.D.   On: 06/04/2018 20:08    Cardiac Studies     Patient Profile     68 y.o. male CAD s/p DES to RCA (2016) and 100% stenosis of D1 treated medically, atrial fibrillation on coumadin s/p AV node ablation with PPM in place, chronic systolic and diastolic heart failure, hypertension, HLD, DM, tobacco abuse, and COPD who is being seen today for the evaluation of chest pain at the request of Dr. Tamala Julian.  Assessment & Plan    1  CP   Pt has been pain free  Troponin remains negative    Will plan for L heart cath tomorrow  Cannot be done today  Note that pt on ASA, Plavix   Contniue for now   Hold coumadin   INR 2.3  2   Atrial fibrillation    S/P AV node ablation with PPM    Hold coumadin for now    3  Chronic systolic CHF    Echo 6004 :LVEF 35 to40%   Repeat echo ordered    Volume status is OK Continue meds    4  HTN   Follow BP on current regimen    5   HL  COntinue statin   For questions or updates, please contact [facility] Please consult [email or web address] for contact  info under        Signed, Dorris Carnes, MD  06/06/2018, 7:50 AM

## 2018-06-06 NOTE — Progress Notes (Addendum)
Progress Note    [name] Cook  WUJ:[id number] DOB: 09-21-1950  DOA: 06/04/2018 PCP: [name], NP    Brief Narrative:   Chief complaint: Chest pain  Medical records reviewed and are as summarized below:  [name] Cook is an 68 y.o. male past medical history of HTN, HLD, PAF, on Coumadin, CAD s/p stent in 2016, and prostate cancer; who presented with complaints of 3 days of acutely worsening intermittent chest pressure with exertion.  Assessment/Plan:   Principal Problem:   Chest pain Active Problems:   Chronic anticoagulation   Atrial fibrillation   Hypertension   Diabetes mellitus type 2, uncomplicated (HCC)  Chest pain/pressure, history of coronary artery disease: Acute.  Symptoms acutely worse, but present over the last 3 months.  Noted to have radiation down the left arm and resolved with rest.  Patient with previous history of coronary artery disease requiring placement of drug-eluting stent to RCA in 10/2014. and heart score = 5.  Troponins negative thus far and EKG showing paced rhythm with possible background atrial fibrillation.  The statics negative.  CT angiogram negative for any signs of a PE with atherosclerosis of the great vessels without signs of acute abnormality. Patient followed in outpatient setting by [name].  Cardiology evaluated and plan for cardiac cath on 2/21 scheduled for 2 PM.  No reports of pain overnight. -Nitroglycerin as needed -Continue  aspirin 81 mg and Plavix -Continue beta-blocker and statin -Appreciate cardiology consultative services -Precath orders per cardiology -N.p.o. prior to procedure on   Paroxysmal atrial fibrillation s/p PM on chronic anticoagulation: INR 2.75->2.75->2.31.  Last given 2.5 mg of Coumadin on 2/19. -Daily PT/INR -Discontinued Coumadin and orders for Coumadin per pharmacy dosing  Combined congestive and diastolic heart failure: Patient appears to be euvolemic at this time and denies any reports of shortness  of breath.  Last EF noted to be 35 to 40% in 2016. -Strict intake and output  -Daily weight -Continue appropriate blood pressure medications  Essential hypertension: On admission noted to have soft blood pressures 95/65, but improved to 125/104 with IV fluids which were stopped on hospital day 2/3. -Continue metoprolol and isosorbide mononitrate -Hold lisinopril prior to catheter and restart thereafter  Diabetes mellitus type 2: Well controlled.  Last hemoglobin A1c noted to be 6.4 on 04/02/2018.  Complications include peripheral neuropathy.  Home medications include metformin, Januvia, and gabapentin. -Hypoglycemic protocol -Hold metformin and Januvia -CBGs q. before meals and bedtime with SSI -Continue gabapentin  Hyperlipidemia: Home medications include Crestor 20 mg daily.  Lipid panel revealed total cholesterol 130, HDL 42, LDL 71, and triglycerides 87.  LDL is relatively at goal of less than 70. -Continue statin at current dose  COPD, without acute exacerbation: Stable -Breathing treatments as needed  Prostate cancer: Diagnosed with stage II adenocarcinoma the prostate with last PSA 6.1 and Gleason score 7.  Recently evaluated by radiation oncology last week for treatment options prostate cancer by Dr. Noreene Filbert. -Will need continue outpatient follow-up  GERD -Continue Protonix  Tobacco abuse  Body mass index is 30.81 kg/m.   Family Communication/Anticipated D/C date and plan/Code Status   DVT prophylaxis:   Code Status: Full Code.  Family Communication: No family present at bedside Disposition Plan: Possible discharge home in 1 to 2 days after evaluation by cardiology   Medical Consultants:    Cardiology   Anti-Infectives:    None  Subjective:   Patient reports that the chest discomfort symptoms have actually  been present for 3 months possibly, but acutely worsened in the last 3 days.  Denies any significant symptoms overnight.  Denies any leg  swelling, shortness of breath, or lightheaded symptoms at this time. Objective:    Vitals:   06/05/18 0801 06/05/18 1236 06/05/18 2313 06/06/18 0631  BP: 119/80 104/64 125/68 125/74  Pulse: 71 71 92 70  Resp: 18 18 18 20   Temp: 97.7 F (36.5 C) (!) 97.5 F (36.4 C) 97.8 F (36.6 C) 97.7 F (36.5 C)  TempSrc: Oral Oral Oral Oral  SpO2: 96% 96% 95% 96%  Weight: 101.3 kg   100.2 kg  Height: 5\' 11"  (1.803 m)       Intake/Output Summary (Last 24 hours) at 06/06/2018 0757 Last data filed at 06/05/2018 1519 Gross per 24 hour  Intake 427.91 ml  Output -  Net 427.91 ml   Filed Weights   06/04/18 1753 06/05/18 0801 06/06/18 0631  Weight: 99.8 kg 101.3 kg 100.2 kg    Exam: Constitutional: Obese elderly male currently in NAD, calm, comfortable Eyes: PERRL, lids and conjunctivae normal ENMT: Mucous membranes are moist. Posterior pharynx clear of any exudate or lesions.  Neck: normal, supple, no masses, no thyromegaly.  No JVD Respiratory: clear to auscultation bilaterally, no wheezing, no crackles. Normal respiratory effort. No accessory muscle use.  Cardiovascular: Regular rate and rhythm, no murmurs / rubs / gallops. No extremity edema. 2+ pedal pulses. No carotid bruits.  Abdomen: no tenderness, no masses palpated. No hepatosplenomegaly. Bowel sounds positive.  Musculoskeletal: no clubbing / cyanosis. No joint deformity upper and lower extremities. Good ROM, no contractures. Normal muscle tone.  Skin: no rashes, lesions, ulcers. No induration Neurologic: CN 2-12 grossly intact. Sensation intact, DTR normal. Strength 5/5 in all 4.  Psychiatric: Normal judgment and insight. Alert and oriented x 3. Normal mood.    Data Reviewed:   I have personally reviewed following labs and imaging studies:  Labs: Labs show the following:   Basic Metabolic Panel: Recent Labs  Lab 06/04/18 1800 06/05/18 0203  NA 139 138  K 4.0 4.2  CL 104 105  CO2 22 24  GLUCOSE 177* 103*  BUN 18 15   CREATININE 1.23 1.11  CALCIUM 8.7* 9.0  MG  --  1.8   GFR Estimated Creatinine Clearance: 79 mL/min (by C-G formula based on SCr of 1.11 mg/dL). Liver Function Tests: Recent Labs  Lab 06/04/18 1822  AST 18  ALT 14  ALKPHOS 43  BILITOT 1.1  PROT 6.0*  ALBUMIN 3.2*   No results for input(s): LIPASE, AMYLASE in the last 168 hours. No results for input(s): AMMONIA in the last 168 hours. Coagulation profile Recent Labs  Lab 06/04/18 1822 06/05/18 0203 06/06/18 0706  INR 2.75 2.75 2.31    CBC: Recent Labs  Lab 06/04/18 1800 06/05/18 0203  WBC 8.2 7.8  HGB 14.3 14.6  HCT 44.3 45.0  MCV 98.7 98.7  PLT PLATELET CLUMPS NOTED ON SMEAR, COUNT APPEARS DECREASED 108*   Cardiac Enzymes: Recent Labs  Lab 06/05/18 0203  TROPONINI <0.03   BNP (last 3 results) No results for input(s): PROBNP in the last 8760 hours. CBG: Recent Labs  Lab 06/05/18 1126 06/05/18 1639 06/05/18 2105 06/06/18 0630 06/06/18 0746  GLUCAP 107* 130* 145* 120* 115*   D-Dimer: No results for input(s): DDIMER in the last 72 hours. Hgb A1c: No results for input(s): HGBA1C in the last 72 hours. Lipid Profile: No results for input(s): CHOL, HDL, LDLCALC, TRIG, CHOLHDL, LDLDIRECT  in the last 72 hours. Thyroid function studies: No results for input(s): TSH, T4TOTAL, T3FREE, THYROIDAB in the last 72 hours.  Invalid input(s): FREET3 Anemia work up: No results for input(s): VITAMINB12, FOLATE, FERRITIN, TIBC, IRON, RETICCTPCT in the last 72 hours. Sepsis Labs: Recent Labs  Lab 06/04/18 1800 06/05/18 0203  WBC 8.2 7.8    Microbiology No results found for this or any previous visit (from the past 240 hour(s)).  Procedures and diagnostic studies: Left heart cath 10/16/14 1. There is moderate left ventricular systolic dysfunction. 2. Lat 1st Diag lesion, 100% stenosed. 3. Ost 1st Diag to 1st Diag lesion, 99% stenosed. 4. Mid RCA-1 lesion, 30% stenosed. 5. Mid RCA-2 lesion, 95% stenosed.  There is a 0% residual stenosis post intervention. 6. A drug-eluting stent was placed.  FINAL CONCLUSIONS:  SEVERE RCA STENOSIS TREATED SUCCESSFULLY WITH PCI USING A SYNERGY DES  SEVERE DIAGONAL STENOSIS, UNCHANGED FROM PREVIOUS STUDY, WITH MEDICAL THERAPY RECOMMENDED  NONOBSTRUCTIVE LAD AND LCX STENOSIS  MODERATE LV SYSTOLIC DYSFUNCTION     Dg Chest 2 View  Result Date: 06/04/2018 CLINICAL DATA:  Chest pain EXAM: CHEST - 2 VIEW COMPARISON:  03/10/2016 FINDINGS: Dual lead pacemaker unchanged. Negative for heart failure. Lungs clear without infiltrate or effusion. IMPRESSION: No active cardiopulmonary disease. Electronically Signed   By: Franchot Gallo M.D.   On: 06/04/2018 19:08   Ct Angio Chest Pe W And/or Wo Contrast  Result Date: 06/04/2018 CLINICAL DATA:  Left-sided chest pain beginning last night radiating to the left arm. EXAM: CT ANGIOGRAPHY CHEST WITH CONTRAST TECHNIQUE: Multidetector CT imaging of the chest was performed using the standard protocol during bolus administration of intravenous contrast. Multiplanar CT image reconstructions and MIPs were obtained to evaluate the vascular anatomy. CONTRAST:  156mL ISOVUE-370 IOPAMIDOL (ISOVUE-370) INJECTION 76% COMPARISON:  CXR 06/04/2018 FINDINGS: Cardiovascular: Satisfactory opacification of the pulmonary arteries to the segmental level. No evidence of pulmonary embolism. Normal heart size. No pericardial effusion. Aortic atherosclerosis without aneurysm. Atherosclerosis of the great vessels without acute abnormality. Left-sided pacemaker apparatus with leads in the right atrium right ventricle are identified, best visualized on the scout view. Mediastinum/Nodes: No enlarged mediastinal, hilar, or axillary lymph nodes. Thyroid gland, trachea, and esophagus demonstrate no significant findings. Lungs/Pleura: Passive atelectasis and subsegmental atelectasis, greatest at the left lung base. A few tiny subpleural blebs are noted at the right  lung apex. No pneumothorax or dominant mass. No focal pulmonary consolidation. Upper Abdomen: No acute abnormality. Musculoskeletal: No chest wall abnormality. No acute or significant osseous findings. Review of the MIP images confirms the above findings. IMPRESSION: 1. No acute pulmonary embolus, aortic aneurysm or dissection. 2. Passive atelectasis and subsegmental atelectasis, greatest at the left lung base. Aortic Atherosclerosis (ICD10-I70.0). Electronically Signed   By: Ashley Royalty M.D.   On: 06/04/2018 20:08    Medications:   . aspirin EC  81 mg Oral Daily  . buPROPion  150 mg Oral BID  . clopidogrel  75 mg Oral Daily  . escitalopram  10 mg Oral Daily  . gabapentin  300 mg Oral TID  . insulin aspart  0-15 Units Subcutaneous TID WC  . isosorbide mononitrate  30 mg Oral Daily  . lisinopril  20 mg Oral Daily  . metoprolol succinate  25 mg Oral Daily  . pantoprazole  40 mg Oral Daily  . rosuvastatin  20 mg Oral QPM   Continuous Infusions: None    LOS: 0 days   [place]  [facility]   *  Please refer to [email or web address], password [email or web address] to get updated schedule on who will round on this patient, as hospitalists switch teams weekly. If 7PM-7AM, please contact night-coverage at [email or web address], password [email or web address] for any overnight needs.

## 2018-06-06 NOTE — Progress Notes (Signed)
RN rounded on pt. Pt states he does not need anything at this time. 

## 2018-06-07 ENCOUNTER — Encounter (HOSPITAL_COMMUNITY)
Admission: EM | Disposition: A | Discharge status: Home / Self Care | Payer: Self-pay | Source: Home / Self Care | Attending: Emergency Medicine

## 2018-06-07 ENCOUNTER — Encounter (HOSPITAL_COMMUNITY): Payer: Self-pay | Admitting: Cardiovascular Disease

## 2018-06-07 DIAGNOSIS — R079 Chest pain, unspecified: Secondary | ICD-10-CM | POA: Diagnosis not present

## 2018-06-07 DIAGNOSIS — E119 Type 2 diabetes mellitus without complications: Secondary | ICD-10-CM | POA: Diagnosis not present

## 2018-06-07 DIAGNOSIS — I2510 Atherosclerotic heart disease of native coronary artery without angina pectoris: Secondary | ICD-10-CM | POA: Diagnosis not present

## 2018-06-07 DIAGNOSIS — Z950 Presence of cardiac pacemaker: Secondary | ICD-10-CM

## 2018-06-07 DIAGNOSIS — E785 Hyperlipidemia, unspecified: Secondary | ICD-10-CM

## 2018-06-07 DIAGNOSIS — Z7901 Long term (current) use of anticoagulants: Secondary | ICD-10-CM | POA: Diagnosis not present

## 2018-06-07 HISTORY — PX: LEFT HEART CATH AND CORONARY ANGIOGRAPHY: CATH118249

## 2018-06-07 LAB — GLUCOSE, CAPILLARY
Glucose-Capillary: 106 mg/dL — ABNORMAL HIGH (ref 70–99)
Glucose-Capillary: 147 mg/dL — ABNORMAL HIGH (ref 70–99)

## 2018-06-07 LAB — PROTIME-INR
INR: 1.51
Prothrombin Time: 18 seconds — ABNORMAL HIGH (ref 11.4–15.2)

## 2018-06-07 SURGERY — LEFT HEART CATH AND CORONARY ANGIOGRAPHY
Anesthesia: LOCAL

## 2018-06-07 MED ORDER — WARFARIN SODIUM 3 MG PO TABS: 6.0000 mg | ORAL_TABLET | Freq: Once | ORAL | Status: DC

## 2018-06-07 MED ORDER — HEPARIN (PORCINE) IN NACL 1000-0.9 UT/500ML-% IV SOLN
INTRAVENOUS | Status: DC | PRN
  Administered 2018-06-07: 500 mL

## 2018-06-07 MED ORDER — VERAPAMIL HCL 2.5 MG/ML IV SOLN
INTRAVENOUS | Status: DC | PRN
  Administered 2018-06-07: 10 mL via INTRA_ARTERIAL

## 2018-06-07 MED ORDER — MIDAZOLAM HCL 2 MG/2ML IJ SOLN
INTRAMUSCULAR | Status: AC
  Filled 2018-06-07: qty 2

## 2018-06-07 MED ORDER — LIDOCAINE HCL (PF) 1 % IJ SOLN
INTRAMUSCULAR | Status: AC
  Filled 2018-06-07: qty 30

## 2018-06-07 MED ORDER — HEPARIN (PORCINE) IN NACL 1000-0.9 UT/500ML-% IV SOLN
INTRAVENOUS | Status: DC | PRN
  Administered 2018-06-07 (×2): 500 mL

## 2018-06-07 MED ORDER — VERAPAMIL HCL 2.5 MG/ML IV SOLN
INTRAVENOUS | Status: AC
  Filled 2018-06-07: qty 2

## 2018-06-07 MED ORDER — HEPARIN SODIUM (PORCINE) 1000 UNIT/ML IJ SOLN
INTRAMUSCULAR | Status: DC | PRN
  Administered 2018-06-07: 5000 [IU] via INTRAVENOUS

## 2018-06-07 MED ORDER — SODIUM CHLORIDE 0.9% FLUSH: 3.0000 mL | Freq: Two times a day (BID) | INTRAVENOUS | Status: DC

## 2018-06-07 MED ORDER — MIDAZOLAM HCL 2 MG/2ML IJ SOLN
INTRAMUSCULAR | Status: DC | PRN
  Administered 2018-06-07: 2 mg via INTRAVENOUS

## 2018-06-07 MED ORDER — WARFARIN - PHARMACIST DOSING INPATIENT: Freq: Every day | Status: DC

## 2018-06-07 MED ORDER — LIDOCAINE HCL (PF) 1 % IJ SOLN
INTRAMUSCULAR | Status: DC | PRN
  Administered 2018-06-07: 3 mL

## 2018-06-07 MED ORDER — SODIUM CHLORIDE 0.9 % IV SOLN
INTRAVENOUS | Status: AC
  Administered 2018-06-07: 09:00:00 via INTRAVENOUS

## 2018-06-07 MED ORDER — HEPARIN (PORCINE) IN NACL 1000-0.9 UT/500ML-% IV SOLN
INTRAVENOUS | Status: AC
  Filled 2018-06-07: qty 1000

## 2018-06-07 MED ORDER — FENTANYL CITRATE (PF) 100 MCG/2ML IJ SOLN
INTRAMUSCULAR | Status: AC
  Filled 2018-06-07: qty 2

## 2018-06-07 MED ORDER — FENTANYL CITRATE (PF) 100 MCG/2ML IJ SOLN
INTRAMUSCULAR | Status: DC | PRN
  Administered 2018-06-07: 25 ug via INTRAVENOUS

## 2018-06-07 MED ORDER — SODIUM CHLORIDE 0.9 % IV SOLN: 250.0000 mL | INTRAVENOUS | Status: DC | PRN

## 2018-06-07 MED ORDER — SODIUM CHLORIDE 0.9% FLUSH: 3.0000 mL | INTRAVENOUS | Status: DC | PRN

## 2018-06-07 MED ORDER — IOHEXOL 350 MG/ML SOLN
INTRAVENOUS | Status: DC | PRN
  Administered 2018-06-07: 70 mL via INTRA_ARTERIAL

## 2018-06-07 SURGICAL SUPPLY — 10 items
CATH 5FR JL3.5 JR4 ANG PIG MP (CATHETERS) ×2 IMPLANT
CATH INFINITI 5FR JL4 (CATHETERS) ×2 IMPLANT
DEVICE RAD COMP TR BAND LRG (VASCULAR PRODUCTS) ×2 IMPLANT
GLIDESHEATH SLEND SS 6F .021 (SHEATH) ×2 IMPLANT
GUIDEWIRE INQWIRE 1.5J.035X260 (WIRE) ×1 IMPLANT
INQWIRE 1.5J .035X260CM (WIRE) ×2
KIT HEART LEFT (KITS) ×2 IMPLANT
PACK CARDIAC CATHETERIZATION (CUSTOM PROCEDURE TRAY) ×2 IMPLANT
TRANSDUCER W/STOPCOCK (MISCELLANEOUS) ×2 IMPLANT
TUBING CIL FLEX 10 FLL-RA (TUBING) ×2 IMPLANT

## 2018-06-07 NOTE — Progress Notes (Signed)
Progress Note  Patient Name: [name] Cook Date of Encounter: 06/07/2018  Primary Cardiologist: [name], MD   Subjective   Pt in cath lab   Inpatient Medications    Scheduled Meds: . [MAR Hold] aspirin EC  81 mg Oral Daily  . [MAR Hold] buPROPion  150 mg Oral BID  . [MAR Hold] clopidogrel  75 mg Oral Daily  . [MAR Hold] escitalopram  10 mg Oral Daily  . [MAR Hold] gabapentin  300 mg Oral TID  . [MAR Hold] insulin aspart  0-15 Units Subcutaneous TID WC  . [MAR Hold] isosorbide mononitrate  30 mg Oral Daily  . [MAR Hold] lisinopril  20 mg Oral Daily  . [MAR Hold] metoprolol succinate  25 mg Oral Daily  . [MAR Hold] pantoprazole  40 mg Oral Daily  . [MAR Hold] rosuvastatin  20 mg Oral QPM  . sodium chloride flush  3 mL Intravenous Q12H   Continuous Infusions: . sodium chloride    . sodium chloride 1 mL/kg/hr (06/07/18 0520)   PRN Meds: sodium chloride, [MAR Hold] acetaminophen **OR** [MAR Hold] acetaminophen, [MAR Hold] nitroGLYCERIN, [MAR Hold] ondansetron **OR** [MAR Hold] ondansetron (ZOFRAN) IV, sodium chloride flush   Vital Signs    Vitals:   06/07/18 0825 06/07/18 0830 06/07/18 0835 06/07/18 0840  BP: 134/83 139/82 140/83   Pulse: 75 75 74 (!) 0  Resp: 10 13 13  (!) 0  Temp:      TempSrc:      SpO2: 97% 98% 98% (!) 0%  Weight:      Height:        Intake/Output Summary (Last 24 hours) at 06/07/2018 0846 Last data filed at 06/07/2018 0409 Gross per 24 hour  Intake 720 ml  Output -  Net 720 ml   Last 3 Weights 06/06/2018 06/05/2018 06/04/2018  Weight (lbs) 220 lb 14.4 oz 223 lb 5.2 oz 220 lb  Weight (kg) 100.2 kg 101.3 kg 99.791 kg      Telemetry      ECG     Physical Exam   Pt in cath lab  Labs    Chemistry Recent Labs  Lab 06/04/18 1800 06/04/18 1822 06/05/18 0203  NA 139  --  138  K 4.0  --  4.2  CL 104  --  105  CO2 22  --  24  GLUCOSE 177*  --  103*  BUN 18  --  15  CREATININE 1.23  --  1.11  CALCIUM 8.7*  --  9.0  PROT  --   6.0*  --   ALBUMIN  --  3.2*  --   AST  --  18  --   ALT  --  14  --   ALKPHOS  --  43  --   BILITOT  --  1.1  --   GFRNONAA >60  --  >60  GFRAA >60  --  >60  ANIONGAP 13  --  9     Hematology Recent Labs  Lab 06/04/18 1800 06/05/18 0203  WBC 8.2 7.8  RBC 4.49 4.56  HGB 14.3 14.6  HCT 44.3 45.0  MCV 98.7 98.7  MCH 31.8 32.0  MCHC 32.3 32.4  RDW 13.8 14.0  PLT PLATELET CLUMPS NOTED ON SMEAR, COUNT APPEARS DECREASED 108*    Cardiac Enzymes Recent Labs  Lab 06/05/18 0203  TROPONINI <0.03    Recent Labs  Lab 06/04/18 1835  TROPIPOC 0.00     BNPNo results for input(s): BNP,  PROBNP in the last 168 hours.   DDimer No results for input(s): DDIMER in the last 168 hours.   Radiology    No results found.  Cardiac Studies   Echo   1. The left ventricle has mildly reduced systolic function, with an ejection fraction of 45-50%. The cavity size was normal. There is mildly increased left ventricular wall thickness of the anterior wall. Left ventricular diastolic Doppler parameters  are indeterminate.  2. Mid and apical anterior septum and apical inferior segment are akinetic.  3. The right ventricle has normal systolic function. The cavity was normal. There is no increase in right ventricular wall thickness.  4. Left atrial size was moderately dilated.  5. The mitral valve is normal in structure.  6. The tricuspid valve is normal in structure.  7. The aortic valve is tricuspid Mild thickening of the aortic valve Mild calcification of the aortic valve.  8. The pulmonic valve was normal in structure.  9. The inferior vena cava was dilated in size with <50% respiratory variability.   Patient Profile     68 y.o. male CAD s/p DES to RCA (2016) and 100% stenosis of D1 treated medically, atrial fibrillation on coumadin s/p AV node ablation with PPM in place, chronic systolic and diastolic heart failure, hypertension, HLD, DM, tobacco abuse, and COPD who is being seen today  for the evaluation of chest pain at the request of Dr. Tamala Julian.  Assessment & Plan    1  CP  Cath today  Pt has been on ASA and Plavix  2   Atrial fibrillation    S/P AV node ablation with PPM    Hold coumadin for now    1NR 1.5    3  Chronic systolic CHF    Echo noted above   LVEF is improved from previous echo in 2016  4  HTN   Follow BP on current regimen    5   HL  COntinue statin   For questions or updates, please contact [place] Please consult [email or web address] for contact info under        Signed, Dorris Carnes, MD  06/07/2018, 8:46 AM

## 2018-06-07 NOTE — Interval H&P Note (Signed)
Cath Lab Visit (complete for each Cath Lab visit)  Clinical Evaluation Leading to the Procedure:   ACS: No.  Non-ACS:    Anginal Classification: CCS III  Anti-ischemic medical therapy: Maximal Therapy (2 or more classes of medications)  Non-Invasive Test Results: No non-invasive testing performed  Prior CABG: No previous CABG      History and Physical Interval Note:  06/07/2018 8:03 AM  [name] Cook  has presented today for surgery, with the diagnosis of cp  The various methods of treatment have been discussed with the patient and family. After consideration of risks, benefits and other options for treatment, the patient has consented to  Procedure(s): LEFT HEART CATH AND CORONARY ANGIOGRAPHY (N/A) as a surgical intervention .  The patient's history has been reviewed, patient examined, no change in status, stable for surgery.  I have reviewed the patient's chart and labs.  Questions were answered to the patient's satisfaction.     [name]

## 2018-06-07 NOTE — Progress Notes (Addendum)
    Mr. Littles heart cath showed no significant change, no intervention done. He can go home today from cardiology perspective. He can resume coumadin. He has appt in coumadin clinic on 06/13/18. I will arrange for office follow up.   Daune Perch, AGNP-C [facility] 06/07/2018  10:37 AM Pager: [phone]

## 2018-06-07 NOTE — Discharge Summary (Signed)
[name], is a 68 y.o. male  DOB 01-03-1951  MRN [id number].  Admission date:  06/04/2018  Admitting Physician  Rhetta Mura, DO  Discharge Date:  06/07/2018   Primary MD  [name], NP  Recommendations for primary care physician for things to follow:    Discharge Diagnosis   Principal Problem:   Chest pain Active Problems:   Chronic anticoagulation   Atrial fibrillation   Hypertension   Hyperlipidemia   Diabetes mellitus type 2, uncomplicated [facility])   Pacemaker   Long term current use of anticoagulant therapy   Coronary artery disease   Prostate cancer [facility])      Past Medical History:  Diagnosis Date  . Arthritis    "knees and lower back" (03/14/2013)  . Atrial flutter ([place])    radiofrequency ablation in 2001  . CAD (coronary artery disease)    a. Nonobstructive. Cardiac cath in 2001-50% mid RI, normal LM, LAD, RCA b. cath 10/16/2014 95% mid RCA treated with DES, 99% ost D1 medical management due to small aneurysmal segment  . Chronic anticoagulation    chronic Coumadin anticoagulation  . Chronic obstructive pulmonary disease ([place]) 04/20/2011  . Diabetes mellitus, type 2 ([place])   . GERD (gastroesophageal reflux disease)   . Hyperlipidemia   . Hypertension    with hypertensive heart disease  . Obesity   . Persistent atrial fibrillation    recurrent atrial flutter since 2001 s/p DCCVs, multiple failed AADs, h/o tachy-mediated cardiomyopathy  . Shortness of breath    "can come on at any time" (03/14/2013)  . Sleep apnea    "dx'd; couldn't wear the mask" (03/14/2013)  . Tobacco abuse     Past Surgical History:  Procedure Laterality Date  . ATRIAL FLUTTER ABLATION  2002   atrial flutter; subsequently developed atrial fibrillation  . AV NODE ABLATION  01/24/2013  . CARDIAC CATHETERIZATION  2002   . CARDIAC CATHETERIZATION N/A 10/16/2014   Procedure: Left Heart Cath and Coronary Angiography;  Surgeon: [name], MD;  Location: [place] CV LAB;  Service: Cardiovascular;  Laterality: N/A;  . CARDIOVERSION  05/31/2011   Procedure: CARDIOVERSION;  Surgeon: Cristopher Estimable. Lattie Haw, MD;  Location: [facility];  Service: Cardiovascular;  Laterality: N/A;  . CARPAL TUNNEL RELEASE Left 1980's  . INSERT / REPLACE / REMOVE PACEMAKER  01/24/2013    Medtronic Adapta L dual-chamber pacemaker, serial number [id number]   . LEFT HEART CATH AND CORONARY ANGIOGRAPHY N/A 06/07/2018   Procedure: LEFT HEART CATH AND CORONARY ANGIOGRAPHY;  Surgeon: [name], MD;  Location: [place] CV LAB;  Service: Cardiovascular;  Laterality: N/A;  . LEFT HEART CATHETERIZATION WITH CORONARY ANGIOGRAM N/A 03/17/2013   Procedure: LEFT HEART CATHETERIZATION WITH CORONARY ANGIOGRAM;  Surgeon: Burnell Blanks, MD; LAD mild dz, D1 branch 100%, inferior branch 99%, CFX OK, RCA 50%, EF 65%    . LOOP RECORDER IMPLANT  2002  . PERMANENT PACEMAKER INSERTION N/A 01/24/2013   Procedure: PERMANENT PACEMAKER INSERTION;  Surgeon: [name],  MD;  Location: [place] CATH LAB;  Service: Cardiovascular;  Laterality: N/A;  . TIBIAL TUBERCLERPLASTY  ~ 2003       HPI  from the history and physical done on the day of admission:  [name] Cook is a 68 y.o. male with medical history significant for coronary artery disease coronary angiography in July 2016 revealing 95% stenosis of mid RCA status post drug-eluting stent x1 and 99% stenosis of first diagonal, hypertension, hyperlipidemia, type 2 diabetes mellitus, and paroxysmal atrial fibrillation chronically anticoagulated on warfarin, who is admitted to [facility] on 06/04/2018 for further evaluation and management of presenting chest pain.  The patient reports 3 days of intermittent substernal nonradiating chest pressure brought on by exertion and relieved with rest.  Reports  that episodes typically last 1 to 5 minutes before spontaneously resolving in the absence of any interval nitroglycerin, and are associated with shortness of breath and dizziness in the absence of any presyncope or syncope.  He also denies any associated palpitations, diaphoresis, or nausea/vomiting.  He reports that the chest pain is non-positional and nonreproducible with palpation of the anterior chest wall.  He feels that there is at times, a pleuritic element to the chest discomfort, stating that the chest pain appears to worsen with deep inspiration.  Patient denies any recent travel, peripheral edema, or hemoptysis.  No history of DVT/PE, although he does convey a recent diagnosis of prostate cancer, for which she is set to initiate radiation therapy.  No recent trauma.  Denies any associated subjective fever, chills, rigors, or generalized myalgias.  Denies any associated orthopnea or PND.  Past medical history notable for a known history of coronary artery disease, with most recent coronary angiography the performed in July 2016 demonstrating 95% stenosis of the mid RCA at which time he received drug-eluting stent x1, 99% of first diagonal with ensuing recommendation for medical management due to a small aneurysmal segment.  The patient reports no subsequent ischemic evaluation, including no subsequent stress test or coronary angiography.  He acknowledges a history of hypertension, hyperlipidemia, and type 2 diabetes mellitus.  He also reports that he is a former smoker, having completely quit smoking in 2013.  In the setting of an additional episode of exertional chest pain this evening, the patient contacted EMS who brought the patient to [facility] emergency department for further evaluation.  He received a full dose aspirin in route via EMS.    ED Course: Vital signs in the ED were notable for the following: Temperature 97.3; heart rate 70-74; blood pressure was initially noted to be 95/65,  which increased to 104/74 following interval IV fluids as further described low.  Respiratory rate 15-20, oxygen saturation 95 to 99% on room air.  Labs from the ED were notable and: BMP notable for sodium 139, potassium 4.0, creatinine 1.23.  Troponin x1-.  CBC notable for white cell count of 8200.  INR in the setting of chronic warfarin therapy noted to be 2.75.  Two-view chest x-ray, per final radiology report showed no evidence of acute cardiopulmonary process, including no evidence of infiltrate, edema, or effusion.  CTA of the chest demonstrated no evidence of acute pulmonary embolism, aneurysm, dissection, or infiltrate.  EKG showed evidence of paced rhythm with out evidence of acute ischemic changes.    While in the ED, the patient received a 500 cc IV normal saline bolus.  Subsequently, he was admitted to the med telemetry floor for overnight observation for work-up and management of  presenting chest pain.      Hospital Course:   1.  Chest pain/pressure, history of coronary artery disease: Acute.  Symptoms acutely worse, but present over the last 3 months.  Noted to have radiation down the left arm and resolved with rest.  Patient with previous history of coronary artery disease requiring placement of drug-eluting stent to RCA in 10/2014. and heart score = 5.  Troponins negative thus far and EKG showing paced rhythm with possible background atrial fibrillation.  The statics negative.  CT angiogram negative for any signs of a PE with atherosclerosis of the great vessels without signs of acute abnormality. Echocardiogram from 2/20 revealed EF of 45 to 50% with mid and apical anterior septum and apical anterior segments noted to be akinetic.  Patient followed in outpatient setting by [name].  Cardiology evaluated and patient underwent cardiac cath on 2/21: 1. Patent mid-RCA stent with no evidence of restenosis 2. Nonobstructive RCA stenosis 3. Patent left main, LAD, and LCx without significant  stenosis 4. Severe complex stenosis of the first diagonal branch of the LAD, unchanged from previous cath studies 5. Normal LVEDP  Recommend: medical therapy. Stable coronary anatomy. Resume warfarin today. Probably best to continue clopidogrel considering extent of his CAD and lack of bleeding problems. Will DC ASA.   2.  Paroxysmal atrial fibrillation s/p PM on chronic anticoagulation: INR 2.75->2.75->2.31->1.5 prior to cardiac cath.  Patient was recommended to resume Coumadin and Plavix at discharge.  3.  Combined congestive and diastolic heart failure: Patient appears to be euvolemic at this time and denies any reports of shortness of breath.  Last EF noted to be 35 to 40% in 2016.  4.  Essential hypertension: On admission noted to have soft blood pressures 95/65, but improved to 125/104 with IV fluids.  Patient was continued on metoprolol, lisinopril, and isosorbide mononitrate.  5.  Diabetes mellitus type 2: Well controlled.  Last hemoglobin A1c noted to be 6.4 on 04/02/2018.  Complications include peripheral neuropathy.  Home medications include metformin, Januvia, and gabapentin.  Oral agents on metformin and Januvia were held during hospitalization patient was advised to restart metformin 2 days following cath.  6.  Hyperlipidemia: Home medications include Crestor 20 mg daily.  Lipid panel revealed total cholesterol 130, HDL 42, LDL 71, and triglycerides 87.  LDL is relatively at goal of less than 70.  He was continued on his current dose Crestor.  7.  COPD, without acute exacerbation: Stable.  Patient was given breathing treatments as needed during his hospital stay  8.  Prostate cancer: Diagnosed with stage II adenocarcinoma the prostate with last PSA 6.1 and Gleason score 7.  Recently evaluated by radiation oncology last week for treatment options prostate cancer by Dr. Noreene Filbert.  He was recommended to continue outpatient follow-up.  9.  GERD.  Continue PPI  10. Remote  history of Tobacco abuse    Follow UP  Follow-up Information    Richardson Dopp T, PA-C Follow up.   Specialties:  Cardiology, Physician Assistant Why:  Cardiology hospital follow-up on 06/25/2018 at 2:15.  Please arrive 15 minutes early for check-in Contact information: [address] [place] [address] [phone]            Consults obtained: Cardiology Dr. Dorris Carnes and Dr. Illa Level  Discharge Condition: Stable  Diet and Activity recommendation: See Discharge Instructions below  Discharge Instructions    Diet - low sodium heart healthy   Complete by:  As directed    Discharge instructions   Complete by:  As directed    Follow-up with Primary MD [name], NP in 7-10 days.  The heart cath showed no significant change, and no intervention was performed.  Resume Plavix and Coumadin, but stop taking aspirin.  You will be notified regarding follow-up appointments in Coumadin clinic and with cardiology.  Please keep previously scheduled follow-up appointments for prostate cancer treatment.  Get CBC and CMP-  checked  by Primary MD  within 5-7 days ( we routinely change or add medications that can affect your baseline labs and fluid status, therefore we recommend that you get the mentioned basic workup next visit with your PCP, your PCP may decide not to get them or add new tests based on their clinical decision)  Disposition: Home   Diet: Heart Healthy and carbohydrate modified     For Heart failure patients - Check your Weight same time everyday, if you gain over 2 pounds, or you develop in leg swelling, experience more shortness of breath or chest pain, call your Primary doctor immediately. Follow Cardiac Low Salt Diet and 1.5 lit/day fluid restriction.  Special Instructions: If you have smoked or chewed Tobacco  in the last 2 yrs please stop smoking, stop any regular Alcohol  and or any Recreational drug use.  On your next visit with your  primary care physician please Get Medicines reviewed and adjusted.  Please request your [name], NP to go over all Hospital Tests and Procedure/Radiological results at the follow up, please get all Hospital records sent to your Prim MD by signing hospital release before you go home.  If you experience worsening of your admission symptoms, develop shortness of breath, life threatening emergency, suicidal or homicidal thoughts you must seek medical attention immediately by calling 911 or calling your MD immediately  if symptoms less severe.  You Must read complete instructions/literature along with all the possible adverse reactions/side effects for all the Medicines you take and that have been prescribed to you. Take any new Medicines after you have completely understood and accpet all the possible adverse reactions/side effects.   Do not drive, operate heavy machinery, perform activities at heights, swimming or participation in water activities or provide baby sitting services if your were admitted for syncope or siezures until you have seen by Primary MD or a Neurologist and advised to do so again.  Do not drive when taking Pain medications.  Do not take more than prescribed Pain, Sleep and Anxiety Medications  Wear Seat belts while driving.   Please note  You were cared for by a hospitalist during your hospital stay. If you have any questions about your discharge medications or the care you received while you were in the hospital after you are discharged, you can call the unit and asked to speak with the hospitalist on call if the hospitalist that took care of you is not available. Once you are discharged, your primary care physician will handle any further medical issues. Please note that NO REFILLS for any discharge medications will be authorized once you are discharged, as it is imperative that you return to your primary care physician (or establish a relationship with a primary care  physician if you do not have one) for your aftercare needs so that they can reassess your need for medications and monitor your lab values.   Increase activity slowly   Complete by:  As directed  Discharge Medications     Allergies as of 06/07/2018   No Known Allergies     Medication List    STOP taking these medications   fluticasone 50 MCG/ACT nasal spray Commonly known as:  FLONASE   promethazine 25 MG tablet Commonly known as:  PHENERGAN     TAKE these medications   blood glucose meter kit and supplies Kit Dispense based on patient and insurance preference. Use up to four times daily as directed. (FOR ICD-9 250.00, 250.01).   buPROPion 150 MG 12 hr tablet Commonly known as:  ZYBAN TAKE 1 TABLET BY MOUTH TWICE A DAY   clopidogrel 75 MG tablet Commonly known as:  PLAVIX TAKE 1 TABLET BY MOUTH EVERY DAY   escitalopram 10 MG tablet Commonly known as:  LEXAPRO TAKE 1 TABLET BY MOUTH EVERY DAY   fish oil-omega-3 fatty acids 1000 MG capsule Take 1 g by mouth daily.   furosemide 20 MG tablet Commonly known as:  LASIX TAKE 1 TABLET BY MOUTH EVERY DAY   gabapentin 300 MG capsule Commonly known as:  NEURONTIN Take 1 capsule by mouth three times daily for diabetic neuropathy. What changed:    how much to take  how to take this  when to take this  additional instructions   glucose blood test strip Commonly known as:  ONE TOUCH ULTRA TEST USE TO TEST UP TO 4 TIMES DAILY   isosorbide mononitrate 30 MG 24 hr tablet Commonly known as:  IMDUR TAKE 1 TABLET BY MOUTH EVERY DAY   JARDIANCE 10 MG Tabs tablet Generic drug:  empagliflozin TAKE 1 TABLET BY MOUTH EVERY MORNING FOR DIABETES. What changed:  See the new instructions.   lisinopril 20 MG tablet Commonly known as:  PRINIVIL,ZESTRIL TAKE 1 TABLET BY MOUTH EVERY DAY   metFORMIN 500 MG tablet Commonly known as:  GLUCOPHAGE TAKE 1 TABLET BY MOUTH TWICE DAILY WITH A MEAL What changed:    how  much to take  how to take this  when to take this  additional instructions   metoprolol succinate 25 MG 24 hr tablet Commonly known as:  TOPROL XL Take 1 tablet (25 mg total) by mouth daily.   nitroGLYCERIN 0.4 MG SL tablet Commonly known as:  NITROSTAT Place 1 tablet (0.4 mg total) under the tongue every 5 (five) minutes as needed for chest pain.   pantoprazole 40 MG tablet Commonly known as:  PROTONIX TAKE 1 TABLET BY MOUTH ONCE DAILY   rosuvastatin 20 MG tablet Commonly known as:  CRESTOR Take 1 tablet by mouth every evening for cholesterol. What changed:    how much to take  how to take this  when to take this  additional instructions   sitaGLIPtin 100 MG tablet Commonly known as:  JANUVIA Take 1 tablet by mouth once daily for diabetes. What changed:    how much to take  how to take this  when to take this  additional instructions   tamsulosin 0.4 MG Caps capsule Commonly known as:  FLOMAX Take 1 capsule (0.4 mg total) by mouth daily.   traMADol 50 MG tablet Commonly known as:  ULTRAM Take 1 tablet (50 mg total) by mouth every 6 (six) hours as needed for severe pain. Caution of sedation   warfarin 1 MG tablet Commonly known as:  COUMADIN Take as directed. If you are unsure how to take this medication, talk to your nurse or doctor. Original instructions:  TAKE 1 TABLET ON MONDAYS. PT. USES 1  MG TABLETS IN ADDITION TO 5 MG TABLETS. What changed:  See the new instructions.   warfarin 5 MG tablet Commonly known as:  COUMADIN Take as directed. If you are unsure how to take this medication, talk to your nurse or doctor. Original instructions:  TAKE 1 TABLET BY MOUTH ON MONDAYS AND 1/2 TABLET ALL OTHER DAYS What changed:  See the new instructions.       Major procedures and Radiology Reports - PLEASE review detailed and final reports for all details, in brief -   Left heart cath: 06/07/2018: As seen above  Echocardiogram 06/06/2018: Impression    1. The left ventricle has mildly reduced systolic function, with an ejection fraction of 45-50%. The cavity size was normal. There is mildly increased left ventricular wall thickness of the anterior wall. Left ventricular diastolic Doppler parameters  are indeterminate.  2. Mid and apical anterior septum and apical inferior segment are akinetic.  3. The right ventricle has normal systolic function. The cavity was normal. There is no increase in right ventricular wall thickness.  4. Left atrial size was moderately dilated.  5. The mitral valve is normal in structure.  6. The tricuspid valve is normal in structure.  7. The aortic valve is tricuspid Mild thickening of the aortic valve Mild calcification of the aortic valve.  8. The pulmonic valve was normal in structure.  9. The inferior vena cava was dilated in size with <50% respiratory variability.  Dg Chest 2 View  Result Date: 06/04/2018 CLINICAL DATA:  Chest pain EXAM: CHEST - 2 VIEW COMPARISON:  03/10/2016 FINDINGS: Dual lead pacemaker unchanged. Negative for heart failure. Lungs clear without infiltrate or effusion. IMPRESSION: No active cardiopulmonary disease. Electronically Signed   By: Franchot Gallo M.D.   On: 06/04/2018 19:08   Ct Angio Chest Pe W And/or Wo Contrast  Result Date: 06/04/2018 CLINICAL DATA:  Left-sided chest pain beginning last night radiating to the left arm. EXAM: CT ANGIOGRAPHY CHEST WITH CONTRAST TECHNIQUE: Multidetector CT imaging of the chest was performed using the standard protocol during bolus administration of intravenous contrast. Multiplanar CT image reconstructions and MIPs were obtained to evaluate the vascular anatomy. CONTRAST:  144m ISOVUE-370 IOPAMIDOL (ISOVUE-370) INJECTION 76% COMPARISON:  CXR 06/04/2018 FINDINGS: Cardiovascular: Satisfactory opacification of the pulmonary arteries to the segmental level. No evidence of pulmonary embolism. Normal heart size. No pericardial effusion. Aortic  atherosclerosis without aneurysm. Atherosclerosis of the great vessels without acute abnormality. Left-sided pacemaker apparatus with leads in the right atrium right ventricle are identified, best visualized on the scout view. Mediastinum/Nodes: No enlarged mediastinal, hilar, or axillary lymph nodes. Thyroid gland, trachea, and esophagus demonstrate no significant findings. Lungs/Pleura: Passive atelectasis and subsegmental atelectasis, greatest at the left lung base. A few tiny subpleural blebs are noted at the right lung apex. No pneumothorax or dominant mass. No focal pulmonary consolidation. Upper Abdomen: No acute abnormality. Musculoskeletal: No chest wall abnormality. No acute or significant osseous findings. Review of the MIP images confirms the above findings. IMPRESSION: 1. No acute pulmonary embolus, aortic aneurysm or dissection. 2. Passive atelectasis and subsegmental atelectasis, greatest at the left lung base. Aortic Atherosclerosis (ICD10-I70.0). Electronically Signed   By: DAshley RoyaltyM.D.   On: 06/04/2018 20:08   Dg Foot Complete Left  Result Date: 05/08/2018 Please see detailed radiograph report in office note.  Dg Foot Complete Right  Result Date: 05/08/2018 Please see detailed radiograph report in office note.   Micro Results    No results found for  this or any previous visit (from the past 240 hour(s)).     Today   Subjective    Keo Schirmer today has had no recurrence of chest pain and denies any orthopnea, shortness of breath, or lightheadedness symptoms.   Objective   Blood pressure 121/82, pulse 71, temperature (!) 97.5 F (36.4 C), temperature source Oral, resp. rate 18, height _0  (1.803 m), weight 99.6 kg, SpO2 100 %.   Intake/Output Summary (Last 24 hours) at 06/07/2018 1426 Last data filed at 06/07/2018 1232 Gross per 24 hour  Intake 0 ml  Output 500 ml  Net -500 ml    Exam  Constitutional: Obese elderly male in NAD, calm, comfortable Eyes:  PERRL, lids and conjunctivae normal ENMT: Mucous membranes are moist. Posterior pharynx clear of any exudate or lesions.Normal dentition.  Neck: normal, supple, no masses, no thyromegaly Respiratory: clear to auscultation bilaterally, no wheezing, no crackles. Normal respiratory effort. No accessory muscle use.  Cardiovascular: Regular rate and rhythm, no murmurs / rubs / gallops. No extremity edema. 2+ pedal pulses. No carotid bruits.  Abdomen: no tenderness, no masses palpated. No hepatosplenomegaly. Bowel sounds positive.  Musculoskeletal: no clubbing / cyanosis. No joint deformity upper and lower extremities. Good ROM, no contractures. Normal muscle tone.  Skin: no rashes, lesions, ulcers. No induration Neurologic: CN 2-12 grossly intact. Sensation intact, DTR normal. Strength 5/5 in all 4.  Psychiatric: Normal judgment and insight. Alert and oriented x 3. Normal mood.    Data Review   CBC w Diff:  Lab Results  Component Value Date   WBC 7.8 06/05/2018   HGB 14.6 06/05/2018   HGB 13.4 01/23/2013   HCT 45.0 06/05/2018   HCT 37.7 (L) 01/23/2013   PLT 108 (L) 06/05/2018   PLT 158 01/23/2013   LYMPHOPCT 33 10/25/2016   LYMPHOPCT 46.4 01/23/2013   MONOPCT 11 10/25/2016   MONOPCT 9.2 01/23/2013   EOSPCT 4 10/25/2016   EOSPCT 4.5 01/23/2013   BASOPCT 1 10/25/2016   BASOPCT 0.6 01/23/2013    CMP:  Lab Results  Component Value Date   NA 138 06/05/2018   NA 141 01/23/2013   K 4.2 06/05/2018   K 3.4 (L) 01/23/2013   CL 105 06/05/2018   CL 107 01/23/2013   CO2 24 06/05/2018   CO2 26 01/23/2013   BUN 15 06/05/2018   BUN 23 (H) 01/23/2013   CREATININE 1.11 06/05/2018   CREATININE 0.99 03/28/2017   PROT 6.0 (L) 06/04/2018   PROT 6.5 01/23/2013   ALBUMIN 3.2 (L) 06/04/2018   ALBUMIN 3.5 01/23/2013   BILITOT 1.1 06/04/2018   BILITOT 0.3 01/23/2013   ALKPHOS 43 06/04/2018   ALKPHOS 83 01/23/2013   AST 18 06/04/2018   AST 17 01/23/2013   ALT 14 06/04/2018   ALT 27  01/23/2013  .   Total Time in preparing paper work, data evaluation and todays exam - 35 minutes  Norval Morton M.D on 06/07/2018 at 2:26 PM  [facility]   Office  [phone]

## 2018-06-10 ENCOUNTER — Telehealth: Payer: Self-pay

## 2018-06-10 DIAGNOSIS — Z8546 Personal history of malignant neoplasm of prostate: Secondary | ICD-10-CM | POA: Diagnosis present

## 2018-06-10 DIAGNOSIS — C61 Malignant neoplasm of prostate: Secondary | ICD-10-CM | POA: Diagnosis present

## 2018-06-10 NOTE — Telephone Encounter (Signed)
TCM attempt #1 - pt discharged from [facility] on 06/07/18 with admitting dx of chest pain and hypotension. Attempted to reach pt to complete TCM and schedule hospital f/u appt. Attempt unsuccessful. Left message with contact info on primary phone.

## 2018-06-10 NOTE — Telephone Encounter (Signed)
Transitional Care Management Follow-up Telephone Call    Date discharged? 06/07/18  How have you been since you were released from the hospital? Smiley.   Any patient concerns? None. Denies chest pain at this time.    Items Reviewed:  Medications reviewed: Yes  Allergies reviewed: Yes  Dietary changes reviewed: Yes  Referrals reviewed: Yes   Functional Questionnaire:  Independent - I Dependent - D    Activities of Daily Living (ADLs):    Personal hygiene - I Dressing - I Eating - I Maintaining continence - I Transferring - I   Independent Activities of Daily Living (iADLs): Basic communication skills - I Transportation - I Meal preparation  - I Shopping - I Housework - I Managing medications - I  Managing personal finances - I   Confirmed importance and date/time of follow-up visits scheduled YES  Provider Appointment booked with PCP 06/13/18 @ 1120  Confirmed with patient if condition begins to worsen call PCP or go to the ER.  Patient was given the office number and encouraged to call back with question or concerns: YES

## 2018-06-10 NOTE — Telephone Encounter (Signed)
Noted  

## 2018-06-13 ENCOUNTER — Encounter: Payer: Self-pay | Admitting: Primary Care

## 2018-06-13 ENCOUNTER — Ambulatory Visit (INDEPENDENT_AMBULATORY_CARE_PROVIDER_SITE_OTHER): Payer: Medicare Other | Admitting: Primary Care

## 2018-06-13 ENCOUNTER — Ambulatory Visit (INDEPENDENT_AMBULATORY_CARE_PROVIDER_SITE_OTHER): Payer: Medicare Other | Admitting: General Practice

## 2018-06-13 VITALS — BP 96/60 | HR 87 | Temp 98.0°F | Ht 71.0 in | Wt 220.2 lb

## 2018-06-13 DIAGNOSIS — Z7901 Long term (current) use of anticoagulants: Secondary | ICD-10-CM

## 2018-06-13 DIAGNOSIS — I2510 Atherosclerotic heart disease of native coronary artery without angina pectoris: Secondary | ICD-10-CM

## 2018-06-13 DIAGNOSIS — I4819 Other persistent atrial fibrillation: Secondary | ICD-10-CM | POA: Diagnosis not present

## 2018-06-13 DIAGNOSIS — C61 Malignant neoplasm of prostate: Secondary | ICD-10-CM

## 2018-06-13 DIAGNOSIS — R42 Dizziness and giddiness: Secondary | ICD-10-CM | POA: Insufficient documentation

## 2018-06-13 DIAGNOSIS — I10 Essential (primary) hypertension: Secondary | ICD-10-CM | POA: Diagnosis not present

## 2018-06-13 DIAGNOSIS — K219 Gastro-esophageal reflux disease without esophagitis: Secondary | ICD-10-CM

## 2018-06-13 LAB — CBC
HCT: 47.8 % (ref 39.0–52.0)
Hemoglobin: 16.1 g/dL (ref 13.0–17.0)
MCHC: 33.8 g/dL (ref 30.0–36.0)
MCV: 98.8 fl (ref 78.0–100.0)
Platelets: 182 10*3/uL (ref 150.0–400.0)
RBC: 4.84 Mil/uL (ref 4.22–5.81)
RDW: 14.1 % (ref 11.5–15.5)
WBC: 7.1 10*3/uL (ref 4.0–10.5)

## 2018-06-13 LAB — COMPREHENSIVE METABOLIC PANEL
ALT: 17 U/L (ref 0–53)
AST: 16 U/L (ref 0–37)
Albumin: 4.4 g/dL (ref 3.5–5.2)
Alkaline Phosphatase: 59 U/L (ref 39–117)
BUN: 23 mg/dL (ref 6–23)
CO2: 30 mEq/L (ref 19–32)
Calcium: 9.9 mg/dL (ref 8.4–10.5)
Chloride: 104 mEq/L (ref 96–112)
Creatinine, Ser: 1.36 mg/dL (ref 0.40–1.50)
GFR: 52.42 mL/min — ABNORMAL LOW (ref >60.00)
Glucose, Bld: 90 mg/dL (ref 70–99)
Potassium: 4.5 mEq/L (ref 3.5–5.1)
Sodium: 140 mEq/L (ref 135–145)
Total Bilirubin: 0.5 mg/dL (ref 0.2–1.2)
Total Protein: 7.1 g/dL (ref 6.0–8.3)

## 2018-06-13 LAB — POCT INR: INR: 1.5 — AB (ref 2.0–3.0)

## 2018-06-13 MED ORDER — LISINOPRIL 5 MG PO TABS: 5.0000 mg | ORAL_TABLET | Freq: Every day | ORAL | 0 refills | Status: DC

## 2018-06-13 NOTE — Assessment & Plan Note (Signed)
Paroxysmal, rate and rhythm regular today. Continue anticoagulation.

## 2018-06-13 NOTE — Assessment & Plan Note (Deleted)
Following with Coumadin clinic with INR today of 1.5. He was adjusted appropriately.  Continue current regimen.

## 2018-06-13 NOTE — Assessment & Plan Note (Signed)
Since prior to hospital stay, continued now. Cardiac work-up grossly unremarkable during recent hospital stay. Suspect his dizziness to be more from hypotension given that symptoms occur during postural changes and his neuro exam today is grossly unremarkable. We will start by reducing lisinopril to 5 mg, continue other medications as prescribed. We will see him back in the office in 2 weeks for follow-up.

## 2018-06-13 NOTE — Assessment & Plan Note (Signed)
Recent cardiac cath overall unremarkable and unchanged from prior. Echocardiogram with improvement in LVEF 45 to 50%. We will continue clopidogrel and warfarin.  Discussed to discontinue aspirin. Continue statin.  Hospital labs, imaging, notes reviewed.

## 2018-06-13 NOTE — Assessment & Plan Note (Signed)
Following with urology, will be evaluated next week for markers for radiation treatment.

## 2018-06-13 NOTE — Patient Instructions (Addendum)
Pre visit review using our clinic review tool, if applicable. No additional management support is needed unless otherwise documented below in the visit note.  Pt dc'd from hospital on 2/21 with an INR of 1.5.  Patient is having a biopsy on 3/3 and has been instructed by cardiology to hold for 5 days.

## 2018-06-13 NOTE — Assessment & Plan Note (Signed)
Following with Coumadin clinic with INR today of 1.5. He was adjusted appropriately.  Continue current regimen.

## 2018-06-13 NOTE — Assessment & Plan Note (Signed)
More hypotensive recently including recent hospital stay. Hypotensive in the office today, also with home readings. Reduce lisinopril to 5 mg daily to offer renal protection for diabetes. Continue Imdur and metoprolol succinate as prescribed.  He will continue to monitor home readings and follow back up in my office in 2 weeks for evaluation.

## 2018-06-13 NOTE — Assessment & Plan Note (Signed)
Doing well on pantoprazole, continue same. 

## 2018-06-13 NOTE — Progress Notes (Signed)
Subjective:    Patient ID: [name] Cook, male    DOB: 02-07-1951, 68 y.o.   MRN: [id number]  HPI  Mr. Alcalde is a 68 year old male with a history of CAD with stent placement, CHF, paroxysmal atrial fibrillation on warfarin, diabetes, prostate cancer, hyperlipidemia, hypertension who presents today for [facility] follow up.  He called our office on 06/04/18 with a several day history of shortness of breath, dizziness, weakness. Also with "cold symptoms". Given his symptoms and medical history he was referred to the ED for evaluation. He presented to [facility] via EMS with reports of right sided chest pain with deep inspiration, generalized weakness, feeling faint. Recently diagnosed with prostate cancer and planned on treatment initiation soon. He was noted to be hypotensive per EMS so he received 400 cc bolus of NS. Work up in the ED including CTA chest, chest xray, labs ( inlcuding Troponin, CBC, INR) were grossly unremarkable. Given his medical history and presentation he was admitted for further evaluation.  During his hospital stay he was consulted by cardiology. Repeat echo with improved LVEF of 45-50% when compared to 2016. He underwent cardiac catheterization with patent main LAD and left circumflex. Severe stenosis to first branch of LAD without change. LVEDP normal. Warfarin was held during hospital stay, initiated on aspirin and clopidogrel. He was discharged home on 06/07/18, warfarin was resumed and clopidogrel continued. Aspirin discontinued. Home antihypertensives resumed, home diabetic medications resumed two days post discharge. He was encouraged to follow up with PCP, cardiology, and oncology radiology in the outpatient setting. He will need repeat CMP and CBC.  Since discharge home he continues to feel dizzy, also with cold sweats and tinnitus. Dizziness occurs intermittently with position changes and some movement. He is checking his BP at home which is running 90's/70's. He describes his  dizziness as feeling "off balance".  He denies fevers, chills, palpitations, chest pain.  BP Readings from Last 3 Encounters:  06/13/18 96/60  06/07/18 121/82  05/30/18 108/74     Review of Systems  Constitutional: Negative for fever.  Respiratory: Negative for cough.   Cardiovascular: Negative for chest pain and palpitations.  Neurological: Positive for dizziness and light-headedness.       Past Medical History:  Diagnosis Date  . Arthritis    "knees and lower back" (03/14/2013)  . Atrial flutter ([place])    radiofrequency ablation in 2001  . CAD (coronary artery disease)    a. Nonobstructive. Cardiac cath in 2001-50% mid RI, normal LM, LAD, RCA b. cath 10/16/2014 95% mid RCA treated with DES, 99% ost D1 medical management due to small aneurysmal segment  . Chronic anticoagulation    chronic Coumadin anticoagulation  . Chronic obstructive pulmonary disease ([place]) 04/20/2011  . Diabetes mellitus, type 2 ([place])   . GERD (gastroesophageal reflux disease)   . Hyperlipidemia   . Hypertension    with hypertensive heart disease  . Obesity   . Persistent atrial fibrillation    recurrent atrial flutter since 2001 s/p DCCVs, multiple failed AADs, h/o tachy-mediated cardiomyopathy  . Shortness of breath    "can come on at any time" (03/14/2013)  . Sleep apnea    "dx'd; couldn't wear the mask" (03/14/2013)  . Tobacco abuse      Social History   Socioeconomic History  . Marital status: Widowed    Spouse name: Not on file  . Number of children: 1  . Years of education: Not on file  . Highest education level: Not  on file  Occupational History  . Occupation: Merchandiser, retail: UNEMPLOYED  Social Needs  . Financial resource strain: Not on file  . Food insecurity:    Worry: Not on file    Inability: Not on file  . Transportation needs:    Medical: Not on file    Non-medical: Not on file  Tobacco Use  . Smoking status: Former Smoker    Packs/day: 1.00    Years: 42.00     Pack years: 42.00    Types: Cigarettes    Last attempt to quit: 12/31/2011    Years since quitting: 6.4  . Smokeless tobacco: Never Used  Substance and Sexual Activity  . Alcohol use: Not Currently    Comment: 03/14/2013 "stopped drinking back in 2002; never had problem w/it"  . Drug use: No  . Sexual activity: Not Currently  Lifestyle  . Physical activity:    Days per week: Not on file    Minutes per session: Not on file  . Stress: Not on file  Relationships  . Social connections:    Talks on phone: Not on file    Gets together: Not on file    Attends religious service: Not on file    Active member of club or organization: Not on file    Attends meetings of clubs or organizations: Not on file    Relationship status: Not on file  . Intimate partner violence:    Fear of current or ex partner: Not on file    Emotionally abused: Not on file    Physically abused: Not on file    Forced sexual activity: Not on file  Other Topics Concern  . Not on file  Social History Narrative   Single.   Retired.    1 son, deceased.    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   Enjoys playing on the computer.        Past Surgical History:  Procedure Laterality Date  . ATRIAL FLUTTER ABLATION  2002   atrial flutter; subsequently developed atrial fibrillation  . AV NODE ABLATION  01/24/2013  . CARDIAC CATHETERIZATION  2002  . CARDIAC CATHETERIZATION N/A 10/16/2014   Procedure: Left Heart Cath and Coronary Angiography;  Surgeon: [name], MD;  Location: [place] [facility];  Service: Cardiovascular;  Laterality: N/A;  . CARDIOVERSION  05/31/2011   Procedure: CARDIOVERSION;  Surgeon: Cristopher Estimable. Lattie Haw, MD;  Location: [facility];  Service: Cardiovascular;  Laterality: N/A;  . CARPAL TUNNEL RELEASE Left 1980's  . INSERT / REPLACE / REMOVE PACEMAKER  01/24/2013    Medtronic Adapta L dual-chamber pacemaker, serial number [id number]   . LEFT HEART CATH AND  CORONARY ANGIOGRAPHY N/A 06/07/2018   Procedure: LEFT HEART CATH AND CORONARY ANGIOGRAPHY;  Surgeon: [name], MD;  Location: [place] [facility];  Service: Cardiovascular;  Laterality: N/A;  . LEFT HEART CATHETERIZATION WITH CORONARY ANGIOGRAM N/A 03/17/2013   Procedure: LEFT HEART CATHETERIZATION WITH CORONARY ANGIOGRAM;  Surgeon: Burnell Blanks, MD; LAD mild dz, D1 branch 100%, inferior branch 99%, CFX OK, RCA 50%, EF 65%    . LOOP RECORDER IMPLANT  2002  . PERMANENT PACEMAKER INSERTION N/A 01/24/2013   Procedure: PERMANENT PACEMAKER INSERTION;  Surgeon: [name], MD;  Location: [facility];  Service: Cardiovascular;  Laterality: N/A;  . TIBIAL TUBERCLERPLASTY  ~ 2003    Family History  Problem Relation Age of Onset  . Alzheimer's disease Mother   .  Osteoporosis Mother     No Known Allergies  Current Outpatient Medications on File Prior to Visit  Medication Sig Dispense Refill  . buPROPion (ZYBAN) 150 MG 12 hr tablet TAKE 1 TABLET BY MOUTH TWICE A DAY (Patient taking differently: Take 150 mg by mouth 2 (two) times daily. ) 180 tablet 1  . clopidogrel (PLAVIX) 75 MG tablet TAKE 1 TABLET BY MOUTH EVERY DAY (Patient taking differently: Take 75 mg by mouth daily. ) 90 tablet 3  . escitalopram (LEXAPRO) 10 MG tablet TAKE 1 TABLET BY MOUTH EVERY DAY (Patient taking differently: Take 10 mg by mouth daily. ) 90 tablet 1  . fish oil-omega-3 fatty acids 1000 MG capsule Take 1 g by mouth daily.      . furosemide (LASIX) 20 MG tablet TAKE 1 TABLET BY MOUTH EVERY DAY (Patient taking differently: Take 20 mg by mouth daily. ) 90 tablet 2  . gabapentin (NEURONTIN) 300 MG capsule Take 1 capsule by mouth three times daily for diabetic neuropathy. (Patient taking differently: Take 300 mg by mouth 3 (three) times daily. ) 270 capsule 2  . glucose blood (ONE TOUCH ULTRA TEST) test strip USE TO TEST UP TO 4 TIMES DAILY 100 each 2  . isosorbide mononitrate (IMDUR) 30 MG 24 hr tablet TAKE 1  TABLET BY MOUTH EVERY DAY (Patient taking differently: Take 30 mg by mouth daily. ) 90 tablet 1  . JARDIANCE 10 MG TABS tablet TAKE 1 TABLET BY MOUTH EVERY MORNING FOR DIABETES. (Patient taking differently: Take 10 mg by mouth daily. ) 90 tablet 1  . metFORMIN (GLUCOPHAGE) 500 MG tablet TAKE 1 TABLET BY MOUTH TWICE DAILY WITH A MEAL (Patient taking differently: Take 500 mg by mouth 2 (two) times daily with a meal. ) 180 tablet 1  . metoprolol succinate (TOPROL XL) 25 MG 24 hr tablet Take 1 tablet (25 mg total) by mouth daily. 90 tablet 3  . nitroGLYCERIN (NITROSTAT) 0.4 MG SL tablet Place 1 tablet (0.4 mg total) under the tongue every 5 (five) minutes as needed for chest pain. 25 tablet 0  . pantoprazole (PROTONIX) 40 MG tablet TAKE 1 TABLET BY MOUTH ONCE DAILY (Patient taking differently: Take 40 mg by mouth daily. ) 90 tablet 3  . rosuvastatin (CRESTOR) 20 MG tablet Take 1 tablet by mouth every evening for cholesterol. (Patient taking differently: Take 20 mg by mouth every evening. ) 90 tablet 2  . sitaGLIPtin (JANUVIA) 100 MG tablet Take 1 tablet by mouth once daily for diabetes. (Patient taking differently: Take 100 mg by mouth daily. ) 90 tablet 2  . tamsulosin (FLOMAX) 0.4 MG CAPS capsule Take 1 capsule (0.4 mg total) by mouth daily. 30 capsule 11  . warfarin (COUMADIN) 1 MG tablet TAKE 1 TABLET ON MONDAYS. PT. USES 1 MG TABLETS IN ADDITION TO 5 MG TABLETS. (Patient taking differently: Take 1 mg by mouth See admin instructions. Take 1 tablet on Mondays with 5 mg to equal 6 mg) 90 tablet 0  . warfarin (COUMADIN) 5 MG tablet TAKE 1 TABLET BY MOUTH ON MONDAYS AND 1/2 TABLET ALL OTHER DAYS (Patient taking differently: Take 2.5-5 mg by mouth See admin instructions. Take 1 tablet on Mondays with 1 mg to equal 6 mg then take 1/2 tablet all the other days) 48 tablet 7   No current facility-administered medications on file prior to visit.     BP 96/60   Pulse 87   Temp 98 F (36.7 C) (Oral)   Ht  5'  11" (1.803 m)   Wt 220 lb 4 oz (99.9 kg)   SpO2 97%   BMI 30.72 kg/m    Objective:   Physical Exam  Constitutional: He is oriented to person, place, and time. He appears well-nourished.  Eyes: EOM are normal.  Neck: Neck supple.  Cardiovascular: Normal rate and regular rhythm.  Respiratory: Effort normal and breath sounds normal.  Neurological: He is alert and oriented to person, place, and time.  Skin: Skin is warm and dry.  Psychiatric: He has a normal mood and affect.           Assessment & Plan:

## 2018-06-13 NOTE — Patient Instructions (Addendum)
Stop taking aspirin.   Start taking clopidogrel 75 mg. This was started in the hospital.   We've reduced your lisinopril to 5 mg. Stop taking lisinopril 20 mg, tell the pharmacy that you are no longer taking this dose.  Stop by the lab prior to leaving today. I will notify you of your results once received.   Follow up with the oncologist for your prostate cancer.   Schedule a follow up visit with me for blood pressure on March 12th before or after coumadin check.  It was a pleasure to see you today!

## 2018-06-18 ENCOUNTER — Ambulatory Visit (INDEPENDENT_AMBULATORY_CARE_PROVIDER_SITE_OTHER): Payer: Medicare Other | Admitting: Urology

## 2018-06-18 ENCOUNTER — Encounter: Payer: Self-pay | Admitting: Urology

## 2018-06-18 VITALS — BP 102/69 | HR 94 | Ht 71.0 in | Wt 220.0 lb

## 2018-06-18 DIAGNOSIS — C61 Malignant neoplasm of prostate: Secondary | ICD-10-CM | POA: Diagnosis not present

## 2018-06-18 MED ORDER — GENTAMICIN SULFATE 40 MG/ML IJ SOLN
80.0000 mg | Freq: Once | INTRAMUSCULAR | Status: AC
  Administered 2018-06-18: 80 mg via INTRAMUSCULAR

## 2018-06-18 MED ORDER — LEVOFLOXACIN 500 MG PO TABS
500.0000 mg | ORAL_TABLET | Freq: Once | ORAL | Status: AC
  Administered 2018-06-18: 500 mg via ORAL

## 2018-06-18 NOTE — Progress Notes (Signed)
06/18/2018   CC: gold seed markers  HPI: 67 y.o. male with prostate cancer who presents today for placement of fiducial seed markers in anticipation of his upcoming IMRT with Dr. Baruch Gouty.   Prostate Gold Seed Marker Placement Procedure   Informed consent was obtained after discussing risks/benefits of the procedure.  A time out was performed to ensure correct patient identity.  Pre-Procedure: - Gentamicin given prophylactically - PO Levaquin 500 mg also given today  Procedure: -Lidocaine jelly was administered per rectum -Rectal ultrasound probe was placed without difficulty and the prostate visualized - 3 fiducial gold seed markers placed, one at right base, one at left base, one at apex of prostate gland under transrectal ultrasound guidance  Post-Procedure: - Patient tolerated the procedure well - He was counseled to seek immediate medical attention if experiences any severe pain, significant bleeding, or fevers -We reviewed the role of Lupron today including side effects, risk and benefits.  Handout given.  Follow up: Return next week for Lupron and then in 6 months with MD.   I, Lucas Mallow, am acting as a scribe for Dr. Hollice Espy,  I have reviewed the above documentation for accuracy and completeness, and I agree with the above.   Hollice Espy, MD

## 2018-06-18 NOTE — Patient Instructions (Signed)

## 2018-06-19 ENCOUNTER — Ambulatory Visit: Payer: Medicare Other | Admitting: Podiatry

## 2018-06-20 ENCOUNTER — Ambulatory Visit
Admission: RE | Admit: 2018-06-20 | Discharge: 2018-06-20 | Disposition: A | Discharge status: Home / Self Care | Payer: Medicare Other | Source: Ambulatory Visit | Attending: Radiation Oncology | Admitting: Radiation Oncology

## 2018-06-20 DIAGNOSIS — G4730 Sleep apnea, unspecified: Secondary | ICD-10-CM | POA: Insufficient documentation

## 2018-06-20 DIAGNOSIS — Z87891 Personal history of nicotine dependence: Secondary | ICD-10-CM | POA: Insufficient documentation

## 2018-06-20 DIAGNOSIS — Z7901 Long term (current) use of anticoagulants: Secondary | ICD-10-CM | POA: Insufficient documentation

## 2018-06-20 DIAGNOSIS — J449 Chronic obstructive pulmonary disease, unspecified: Secondary | ICD-10-CM | POA: Diagnosis not present

## 2018-06-20 DIAGNOSIS — Z950 Presence of cardiac pacemaker: Secondary | ICD-10-CM | POA: Diagnosis not present

## 2018-06-20 DIAGNOSIS — C61 Malignant neoplasm of prostate: Secondary | ICD-10-CM | POA: Insufficient documentation

## 2018-06-20 DIAGNOSIS — I10 Essential (primary) hypertension: Secondary | ICD-10-CM | POA: Insufficient documentation

## 2018-06-20 DIAGNOSIS — I4819 Other persistent atrial fibrillation: Secondary | ICD-10-CM | POA: Diagnosis not present

## 2018-06-20 DIAGNOSIS — Z79899 Other long term (current) drug therapy: Secondary | ICD-10-CM | POA: Insufficient documentation

## 2018-06-20 DIAGNOSIS — E1140 Type 2 diabetes mellitus with diabetic neuropathy, unspecified: Secondary | ICD-10-CM | POA: Insufficient documentation

## 2018-06-20 DIAGNOSIS — I4892 Unspecified atrial flutter: Secondary | ICD-10-CM | POA: Insufficient documentation

## 2018-06-20 DIAGNOSIS — I2510 Atherosclerotic heart disease of native coronary artery without angina pectoris: Secondary | ICD-10-CM | POA: Diagnosis not present

## 2018-06-20 DIAGNOSIS — Z7902 Long term (current) use of antithrombotics/antiplatelets: Secondary | ICD-10-CM | POA: Insufficient documentation

## 2018-06-20 DIAGNOSIS — E785 Hyperlipidemia, unspecified: Secondary | ICD-10-CM | POA: Insufficient documentation

## 2018-06-20 DIAGNOSIS — K219 Gastro-esophageal reflux disease without esophagitis: Secondary | ICD-10-CM | POA: Diagnosis not present

## 2018-06-21 ENCOUNTER — Telehealth: Payer: Self-pay | Admitting: Urology

## 2018-06-21 NOTE — Telephone Encounter (Signed)
[name] Cook APPROVED [id number] 06-21-18 THRU 06-21-19 06-21-18 MICHELLE

## 2018-06-25 ENCOUNTER — Ambulatory Visit: Payer: Medicare Other

## 2018-06-25 ENCOUNTER — Encounter: Payer: Self-pay | Admitting: Physician Assistant

## 2018-06-25 ENCOUNTER — Ambulatory Visit (INDEPENDENT_AMBULATORY_CARE_PROVIDER_SITE_OTHER): Payer: Medicare Other | Admitting: Physician Assistant

## 2018-06-25 VITALS — BP 112/62 | HR 89 | Ht 71.0 in | Wt 221.0 lb

## 2018-06-25 DIAGNOSIS — E119 Type 2 diabetes mellitus without complications: Secondary | ICD-10-CM

## 2018-06-25 DIAGNOSIS — E785 Hyperlipidemia, unspecified: Secondary | ICD-10-CM

## 2018-06-25 DIAGNOSIS — Z950 Presence of cardiac pacemaker: Secondary | ICD-10-CM | POA: Diagnosis not present

## 2018-06-25 DIAGNOSIS — I25119 Atherosclerotic heart disease of native coronary artery with unspecified angina pectoris: Secondary | ICD-10-CM | POA: Diagnosis not present

## 2018-06-25 DIAGNOSIS — I10 Essential (primary) hypertension: Secondary | ICD-10-CM

## 2018-06-25 DIAGNOSIS — I4821 Permanent atrial fibrillation: Secondary | ICD-10-CM

## 2018-06-25 DIAGNOSIS — C61 Malignant neoplasm of prostate: Secondary | ICD-10-CM | POA: Diagnosis not present

## 2018-06-25 NOTE — Patient Instructions (Signed)
Medication Instructions:  Your physician recommends that you continue on your current medications as directed. Please refer to the Current Medication list given to you today.  If you need a refill on your cardiac medications before your next appointment, please call your pharmacy.   Lab work: NONE ORDERED  TODAY   If you have labs (blood work) drawn today and your tests are completely normal, you will receive your results only by: . MyChart Message (if you have MyChart) OR . A paper copy in the mail If you have any lab test that is abnormal or we need to change your treatment, we will call you to review the results.  Testing/Procedures: NONE ORDERED  TODAY  Follow-Up: At [facility], you and your health needs are our priority.  As part of our continuing mission to provide you with exceptional heart care, we have created designated Provider Care Teams.  These Care Teams include your primary Cardiologist (physician) and Advanced Practice Providers (APPs -  Physician Assistants and Nurse Practitioners) who all work together to provide you with the care you need, when you need it. You will need a follow up appointment in 1 years.  Please call our office 2 months in advance to schedule this appointment.  You may see Dr Taylor  or one of the following Advanced Practice Providers on your designated Care Team:   Amber Seiler, NP . Renee Ursuy, PA-C  Any Other Special Instructions Will Be Listed Below (If Applicable).    

## 2018-06-25 NOTE — Progress Notes (Signed)
Cardiology Office Note:    Date:  06/25/2018   ID:  [name] Cook, DOB [birth year], MRN [id number]  PCP:  [name], NP  Cardiologist/Electrophysiologist:  [name], MD    Referring MD: [name], NP   Chief Complaint  Patient presents with  . Hospitalization Follow-up    Chest pain, cardiac catheterization    History of Present Illness:    [name] Cook is a 68 y.o. male with AFlutter s/p RFCA, CAD, persistent AFib, s/p pacemaker, combined systolic and diastolic HF, diabetes, hyperlipidemia, hypertension, COPD, tobacco use.  He suffered an NSTEMI in 2014 and Cardiac Catheterization demonstrated an occluded sub-branch of the Dx.  This was small in caliber and he was treated medically.  He underwent DES to the RCA in 2016 in the setting of unstable angina.  He failed multiple antiarrhythmics drugs and DCCVs.  He developed lung and liver toxicity with Amiodarone and had difficult to control heart rates.  He ultimately underwent AV nodal ablation and pacemaker implantation.  He was last seen by [name] in 04/2018.   He was admitted 2/18-2/21 with chest discomfort concerning for unstable angina pectoris.  Troponin levels remained normal.  Cardiac catheterization demonstrated patent stent in the mid RCA.  Otherwise, the anatomy was stable when compared to prior studies.  Medical therapy was continued.   [name] Cook returns for posthospitalization follow-up.  He is here alone.  Since discharge from the hospital, he has not had any further chest discomfort.  He has chronic dyspnea with exertion.  This is unchanged.  He denies any recent orthopnea or paroxysmal nocturnal dyspnea.  He denies lower extremity swelling or bleeding issues.  He denies syncope.  Prior CV studies:   The following studies were reviewed today:  Cardiac catheterization 06/07/2018 LM irregularities LAD mild diffuse disease; D1 ostial 99, superior branch occluded/larger inferior branch 99 -  unchanged from  2014 and 2016 LCx mild diffuse disease RCA proximal 40, mid stent patent, 30   Echocardiogram 06/06/2018 EF 45-50, mild LVH, indeterminate diastolic function, anterior septal and inferior akinesis, normal RVSF, moderate LAE, mild aortic valve calcification  Echocardiogram 10/16/2014 Moderate LVH, anterior and periapical hypokinesis, EF 35-40, trivial MR, mild LAE, mild RAE    Past Medical History:  Diagnosis Date  . Arthritis    "knees and lower back" (03/14/2013)  . Atrial flutter ([place])    radiofrequency ablation in 2001  . CAD (coronary artery disease)    a. Nonobstructive. Cardiac cath in 2001-50% mid RI, normal LM, LAD, RCA b. cath 10/16/2014 95% mid RCA treated with DES, 99% ost D1 medical management due to small aneurysmal segment  . Chronic anticoagulation    chronic Coumadin anticoagulation  . Chronic obstructive pulmonary disease ([place]) 04/20/2011  . Diabetes mellitus, type 2 ([place])   . GERD (gastroesophageal reflux disease)   . Hyperlipidemia   . Hypertension    with hypertensive heart disease  . Obesity   . Persistent atrial fibrillation    recurrent atrial flutter since 2001 s/p DCCVs, multiple failed AADs, h/o tachy-mediated cardiomyopathy  . Shortness of breath    "can come on at any time" (03/14/2013)  . Sleep apnea    "dx'd; couldn't wear the mask" (03/14/2013)  . Tobacco abuse    Surgical Hx: The patient  has a past surgical history that includes Atrial flutter ablation (2002); Loop recorder implant (2002); Cardioversion (05/31/2011); AV node ablation (01/24/2013); Insert / replace / remove pacemaker (01/24/2013); Carpal tunnel release (Left, 1980's);  Tibial tuberclerplasty (~ 2003); Cardiac catheterization (2002); permanent pacemaker insertion (N/A, 01/24/2013); left heart catheterization with coronary angiogram (N/A, 03/17/2013); Cardiac catheterization (N/A, 10/16/2014); and LEFT HEART CATH AND CORONARY ANGIOGRAPHY (N/A, 06/07/2018).   Current Medications: Current Meds   Medication Sig  . buPROPion (ZYBAN) 150 MG 12 hr tablet TAKE 1 TABLET BY MOUTH TWICE A DAY  . clopidogrel (PLAVIX) 75 MG tablet TAKE 1 TABLET BY MOUTH EVERY DAY  . escitalopram (LEXAPRO) 10 MG tablet TAKE 1 TABLET BY MOUTH EVERY DAY  . fish oil-omega-3 fatty acids 1000 MG capsule Take 1 g by mouth daily.    . furosemide (LASIX) 20 MG tablet TAKE 1 TABLET BY MOUTH EVERY DAY  . gabapentin (NEURONTIN) 300 MG capsule Take 1 capsule by mouth three times daily for diabetic neuropathy.  Marland Kitchen glucose blood (ONE TOUCH ULTRA TEST) test strip USE TO TEST UP TO 4 TIMES DAILY  . isosorbide mononitrate (IMDUR) 30 MG 24 hr tablet TAKE 1 TABLET BY MOUTH EVERY DAY  . JARDIANCE 10 MG TABS tablet TAKE 1 TABLET BY MOUTH EVERY MORNING FOR DIABETES.  Marland Kitchen lisinopril (PRINIVIL,ZESTRIL) 5 MG tablet Take 1 tablet (5 mg total) by mouth daily. For blood pressure and kidney protection.  . metFORMIN (GLUCOPHAGE) 500 MG tablet TAKE 1 TABLET BY MOUTH TWICE DAILY WITH A MEAL  . metoprolol succinate (TOPROL XL) 25 MG 24 hr tablet Take 1 tablet (25 mg total) by mouth daily.  . nitroGLYCERIN (NITROSTAT) 0.4 MG SL tablet Place 1 tablet (0.4 mg total) under the tongue every 5 (five) minutes as needed for chest pain.  . pantoprazole (PROTONIX) 40 MG tablet TAKE 1 TABLET BY MOUTH ONCE DAILY  . rosuvastatin (CRESTOR) 20 MG tablet Take 1 tablet by mouth every evening for cholesterol.  . sitaGLIPtin (JANUVIA) 100 MG tablet Take 1 tablet by mouth once daily for diabetes.  . tamsulosin (FLOMAX) 0.4 MG CAPS capsule Take 1 capsule (0.4 mg total) by mouth daily.  Marland Kitchen warfarin (COUMADIN) 1 MG tablet TAKE 1 TABLET ON MONDAYS. PT. USES 1 MG TABLETS IN ADDITION TO 5 MG TABLETS.  Marland Kitchen warfarin (COUMADIN) 5 MG tablet TAKE 1 TABLET BY MOUTH ON MONDAYS AND 1/2 TABLET ALL OTHER DAYS     Allergies:   Patient has no known allergies.   Social History   Tobacco Use  . Smoking status: Former Smoker    Packs/day: 1.00    Years: 42.00    Pack years: 42.00      Types: Cigarettes    Last attempt to quit: 12/31/2011    Years since quitting: 6.4  . Smokeless tobacco: Never Used  Substance Use Topics  . Alcohol use: Not Currently    Comment: 03/14/2013 "stopped drinking back in 2002; never had problem w/it"  . Drug use: No     Family Hx: The patient's family history includes Alzheimer's disease in his mother; Osteoporosis in his mother.  ROS:   Please see the history of present illness.    Review of Systems  Respiratory: Positive for shortness of breath.   Neurological: Positive for dizziness.   All other systems reviewed and are negative.   EKGs/Labs/Other Test Reviewed:    EKG:  EKG is  ordered today.  The ekg ordered today demonstrates V paced, HR 89, underlying atrial fibrillation  Recent Labs: 06/05/2018: Magnesium 1.8 06/13/2018: ALT 17; BUN 23; Creatinine, Ser 1.36; Hemoglobin 16.1; Platelets 182.0; Potassium 4.5; Sodium 140   Recent Lipid Panel Lab Results  Component Value Date/Time  CHOL 130 06/06/2018 07:06 AM   CHOL 125 01/23/2013 01:51 AM   TRIG 87 06/06/2018 07:06 AM   TRIG 208 (H) 01/23/2013 01:51 AM   TRIG 289 05/05/2008   HDL 42 06/06/2018 07:06 AM   HDL 33 (L) 01/23/2013 01:51 AM   CHOLHDL 3.1 06/06/2018 07:06 AM   LDLCALC 71 06/06/2018 07:06 AM   LDLCALC 56 03/28/2017 02:33 PM   LDLCALC 50 01/23/2013 01:51 AM   LDLCALC 119 05/05/2008    Physical Exam:    VS:  BP 112/62   Pulse 89   Ht 5\' 11"  (1.803 m)   Wt 221 lb (100.2 kg)   SpO2 97%   BMI 30.82 kg/m     Wt Readings from Last 3 Encounters:  06/25/18 221 lb (100.2 kg)  06/18/18 220 lb (99.8 kg)  06/13/18 220 lb 4 oz (99.9 kg)     Physical Exam  Constitutional: He is oriented to person, place, and time. He appears well-developed and well-nourished. No distress.  HENT:  Head: Normocephalic and atraumatic.  Eyes: No scleral icterus.  Neck: No JVD present. No thyromegaly present.  Cardiovascular: Normal rate and regular rhythm.  No murmur  heard. Pulmonary/Chest: Effort normal. He has no rales.  Abdominal: Soft.  Musculoskeletal:        General: No edema.     Comments: Right wrist without hematoma  Lymphadenopathy:    He has no cervical adenopathy.  Neurological: He is alert and oriented to person, place, and time.  Skin: Skin is warm and dry.  Psychiatric: He has a normal mood and affect.    ASSESSMENT & PLAN:    Coronary artery disease involving native coronary artery of native heart with angina pectoris History of non-ST elevation myocardial infarction in 2014 treated medically and PCI with DES to the RCA in 2016.  Recent admission to the hospital with chest pain.  Cardiac catheterization demonstrated subtotal occlusion of a diagonal subbranch which was unchanged from 2014 and 2016.  He is currently doing well without anginal symptoms on his current medical regimen.  Continue clopidogrel, isosorbide, metoprolol, rosuvastatin.  Permanent atrial fibrillation Status post AV node ablation and pacemaker implantation.  Warfarin is managed by primary care.  Pacemaker Follow-up with EP as planned.  Essential hypertension The patient's blood pressure is controlled on his current regimen.  Continue current therapy.   Hyperlipidemia, unspecified hyperlipidemia type LDL optimal on most recent lab work.  Continue current Rx.    Type 2 diabetes mellitus without complication, without long-term current use of insulin (HCC) Well-controlled.  Hemoglobin A1c in December 2019 6.4.   Dispo:  Return in about 10 months (around 04/27/2019) for Routine Follow Up with [name].   Medication Adjustments/Labs and Tests Ordered: Current medicines are reviewed at length with the patient today.  Concerns regarding medicines are outlined above.  Tests Ordered: Orders Placed This Encounter  Procedures  . EKG 12-Lead   Medication Changes: No orders of the defined types were placed in this encounter.   Signed, Richardson Dopp, PA-C    06/25/2018 2:39 PM    [facility] [address] [place], [place], [place]  [address] Phone: [phone]; Fax: [phone]

## 2018-06-27 ENCOUNTER — Encounter: Payer: Self-pay | Admitting: Primary Care

## 2018-06-27 ENCOUNTER — Ambulatory Visit (INDEPENDENT_AMBULATORY_CARE_PROVIDER_SITE_OTHER): Payer: Medicare Other | Admitting: Primary Care

## 2018-06-27 ENCOUNTER — Other Ambulatory Visit: Payer: Self-pay

## 2018-06-27 ENCOUNTER — Ambulatory Visit (INDEPENDENT_AMBULATORY_CARE_PROVIDER_SITE_OTHER): Payer: Medicare Other

## 2018-06-27 ENCOUNTER — Ambulatory Visit (INDEPENDENT_AMBULATORY_CARE_PROVIDER_SITE_OTHER): Payer: Medicare Other | Admitting: General Practice

## 2018-06-27 VITALS — BP 110/70 | HR 86 | Temp 98.0°F | Ht 71.0 in | Wt 222.5 lb

## 2018-06-27 VITALS — BP 98/63 | HR 83 | Ht 71.0 in | Wt 220.0 lb

## 2018-06-27 DIAGNOSIS — R42 Dizziness and giddiness: Secondary | ICD-10-CM

## 2018-06-27 DIAGNOSIS — I4819 Other persistent atrial fibrillation: Secondary | ICD-10-CM

## 2018-06-27 DIAGNOSIS — N289 Disorder of kidney and ureter, unspecified: Secondary | ICD-10-CM | POA: Diagnosis not present

## 2018-06-27 DIAGNOSIS — I10 Essential (primary) hypertension: Secondary | ICD-10-CM

## 2018-06-27 DIAGNOSIS — C61 Malignant neoplasm of prostate: Secondary | ICD-10-CM

## 2018-06-27 DIAGNOSIS — F339 Major depressive disorder, recurrent, unspecified: Secondary | ICD-10-CM

## 2018-06-27 LAB — BASIC METABOLIC PANEL
BUN: 20 mg/dL (ref 6–23)
CO2: 26 mEq/L (ref 19–32)
Calcium: 9.2 mg/dL (ref 8.4–10.5)
Chloride: 104 mEq/L (ref 96–112)
Creatinine, Ser: 1.16 mg/dL (ref 0.40–1.50)
GFR: 62.97 mL/min (ref >60.00)
Glucose, Bld: 98 mg/dL (ref 70–99)
Potassium: 3.7 mEq/L (ref 3.5–5.1)
Sodium: 139 mEq/L (ref 135–145)

## 2018-06-27 LAB — POCT INR: INR: 1.9 — AB (ref 2.0–3.0)

## 2018-06-27 MED ORDER — ESCITALOPRAM OXALATE 20 MG PO TABS: 20.0000 mg | ORAL_TABLET | Freq: Every day | ORAL | 0 refills | Status: DC

## 2018-06-27 MED ORDER — LEUPROLIDE ACETATE (6 MONTH) 45 MG IM KIT
45.0000 mg | PACK | Freq: Once | INTRAMUSCULAR | Status: AC
  Administered 2018-06-27: 45 mg via INTRAMUSCULAR

## 2018-06-27 NOTE — Assessment & Plan Note (Signed)
Deteriorated over the last 3 months. Discussed options for treatment. Also discussed that he's already on so many other medications that increasing his current doses or adding a new medication may cause drowsiness/dizziness, etc.   He verbalized understanding and would like to proceed with a dose increase of Lexapro to 20 mg. Rx sent to pharmacy. He will call with an update in 4 weeks. Denies SI/HI.

## 2018-06-27 NOTE — Assessment & Plan Note (Signed)
Noted on prior labs. Repeat BMP pending today.

## 2018-06-27 NOTE — Patient Instructions (Incomplete)
Pre visit review using our clinic review tool, if applicable. No additional management support is needed unless otherwise documented below in the visit note.  Take extra 1/2 of 5 mg tablet today and then resume taking 1 tablet (2.5 mg) daily except 6 mg (5 mg tablet and 1 mg tablet) on Mondays.  Re-check in 4 weeks.

## 2018-06-27 NOTE — Progress Notes (Signed)
Lupron IM Injection   Due to Prostate Cancer patient is present today for a Lupron Injection.  Medication: Lupron 6 month Dose: 45 mg  Location: left upper outer buttocks Lot: [id number] Exp: 08/21/20  Patient tolerated well, no complications were noted  Performed by: Shawnie Dapper, CMA  Follow up: as scheduled

## 2018-06-27 NOTE — Assessment & Plan Note (Signed)
Stable in the office today.  Continue current regimen. 

## 2018-06-27 NOTE — Patient Instructions (Addendum)
We've increased your dose of Lexapro to 20 mg. I sent a new prescription to your pharmacy.  Stop by the lab prior to leaving today. I will notify you of your results once received.   Please call me with an update in 4 weeks regarding your depression.  It was a pleasure to see you today!

## 2018-06-27 NOTE — Assessment & Plan Note (Addendum)
Improved since reduction of lisinopril to 5 mg. BP stable. Also managed on numerous other medications that could be contributing. Discussed this as well. Continue to monitor.

## 2018-06-27 NOTE — Progress Notes (Signed)
Subjective:    Patient ID: [name] Cook, male    DOB: January 19, 1951, 68 y.o.   MRN: [id number]  HPI  Mr. Mcqueary is a 68 year old male who presents today for follow up.  1) Essential Hypertension/Dizziness: Currently managed on lisinopril 5 mg, metoprolol succinate 25 mg, isosorbide mononitrate 30 mg, furosemide 20 mg, tamsulosin 0.4 mg.  He was last evaluated two weeks ago for TCM hospital follow up, admitted for dizziness amongst other issues. During his last visit he continued to endorse dizziness with position changes and movement. Home BP readings were 90's/70's so his lisinopril was decreased to 5 mg.  Since his last visit he's feeling slightly better. He's checking his BP at home and is getting readings of 120's/60's. He is also due for BMP.   BP Readings from Last 3 Encounters:  06/27/18 110/70  06/27/18 98/63  06/25/18 112/62   2) Depression: Chronic since 2009. Currently managed on Lexapro 10 mg and bupropion 150 mg BID. He would like to discuss a dose adjustment given symptoms of fatigue, feeling down/depressed, little motivation to do something. Symptoms have progressed over the last 3 months. PHQ 9 score of 16 today. He denies SI/HI.  Review of Systems  Respiratory: Negative for shortness of breath.   Cardiovascular: Negative for chest pain.  Neurological:       Dizziness has improved  Psychiatric/Behavioral:       See HPI       Past Medical History:  Diagnosis Date  . Arthritis    "knees and lower back" (03/14/2013)  . Atrial flutter ([place])    radiofrequency ablation in 2001  . CAD (coronary artery disease)    a. Nonobstructive. Cardiac cath in 2001-50% mid RI, normal LM, LAD, RCA b. cath 10/16/2014 95% mid RCA treated with DES, 99% ost D1 medical management due to small aneurysmal segment  . Chronic anticoagulation    chronic Coumadin anticoagulation  . Chronic obstructive pulmonary disease ([place]) 04/20/2011  . Diabetes mellitus, type 2 ([place])   . GERD (gastroesophageal  reflux disease)   . Hyperlipidemia   . Hypertension    with hypertensive heart disease  . Obesity   . Persistent atrial fibrillation    recurrent atrial flutter since 2001 s/p DCCVs, multiple failed AADs, h/o tachy-mediated cardiomyopathy  . Shortness of breath    "can come on at any time" (03/14/2013)  . Sleep apnea    "dx'd; couldn't wear the mask" (03/14/2013)  . Tobacco abuse      Social History   Socioeconomic History  . Marital status: Widowed    Spouse name: Not on file  . Number of children: 1  . Years of education: Not on file  . Highest education level: Not on file  Occupational History  . Occupation: Merchandiser, retail: UNEMPLOYED  Social Needs  . Financial resource strain: Not on file  . Food insecurity:    Worry: Not on file    Inability: Not on file  . Transportation needs:    Medical: Not on file    Non-medical: Not on file  Tobacco Use  . Smoking status: Former Smoker    Packs/day: 1.00    Years: 42.00    Pack years: 42.00    Types: Cigarettes    Last attempt to quit: 12/31/2011    Years since quitting: 6.4  . Smokeless tobacco: Never Used  Substance and Sexual Activity  . Alcohol use: Not Currently    Comment: 03/14/2013 "stopped drinking  back in 2002; never had problem w/it"  . Drug use: No  . Sexual activity: Not Currently  Lifestyle  . Physical activity:    Days per week: Not on file    Minutes per session: Not on file  . Stress: Not on file  Relationships  . Social connections:    Talks on phone: Not on file    Gets together: Not on file    Attends religious service: Not on file    Active member of club or organization: Not on file    Attends meetings of clubs or organizations: Not on file    Relationship status: Not on file  . Intimate partner violence:    Fear of current or ex partner: Not on file    Emotionally abused: Not on file    Physically abused: Not on file    Forced sexual activity: Not on file  Other Topics Concern   . Not on file  Social History Narrative   Single.   Retired.    1 son, deceased.    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   Enjoys playing on the computer.        Past Surgical History:  Procedure Laterality Date  . ATRIAL FLUTTER ABLATION  2002   atrial flutter; subsequently developed atrial fibrillation  . AV NODE ABLATION  01/24/2013  . CARDIAC CATHETERIZATION  2002  . CARDIAC CATHETERIZATION N/A 10/16/2014   Procedure: Left Heart Cath and Coronary Angiography;  Surgeon: [name], MD;  Location: [facility];  Service: Cardiovascular;  Laterality: N/A;  . CARDIOVERSION  05/31/2011   Procedure: CARDIOVERSION;  Surgeon: Cristopher Estimable. Lattie Haw, MD;  Location: [facility];  Service: Cardiovascular;  Laterality: N/A;  . CARPAL TUNNEL RELEASE Left 1980's  . INSERT / REPLACE / REMOVE PACEMAKER  01/24/2013    Medtronic Adapta L dual-chamber pacemaker, serial number [id number]   . LEFT HEART CATH AND CORONARY ANGIOGRAPHY N/A 06/07/2018   Procedure: LEFT HEART CATH AND CORONARY ANGIOGRAPHY;  Surgeon: [name], MD;  Location: [facility];  Service: Cardiovascular;  Laterality: N/A;  . LEFT HEART CATHETERIZATION WITH CORONARY ANGIOGRAM N/A 03/17/2013   Procedure: LEFT HEART CATHETERIZATION WITH CORONARY ANGIOGRAM;  Surgeon: Burnell Blanks, MD; LAD mild dz, D1 branch 100%, inferior branch 99%, CFX OK, RCA 50%, EF 65%    . LOOP RECORDER IMPLANT  2002  . PERMANENT PACEMAKER INSERTION N/A 01/24/2013   Procedure: PERMANENT PACEMAKER INSERTION;  Surgeon: [name], MD;  Location: [facility];  Service: Cardiovascular;  Laterality: N/A;  . TIBIAL TUBERCLERPLASTY  ~ 2003    Family History  Problem Relation Age of Onset  . Alzheimer's disease Mother   . Osteoporosis Mother     No Known Allergies  Current Outpatient Medications on File Prior to Visit  Medication Sig Dispense Refill  . buPROPion (ZYBAN) 150 MG 12 hr tablet  TAKE 1 TABLET BY MOUTH TWICE A DAY 180 tablet 1  . clopidogrel (PLAVIX) 75 MG tablet TAKE 1 TABLET BY MOUTH EVERY DAY 90 tablet 3  . escitalopram (LEXAPRO) 10 MG tablet TAKE 1 TABLET BY MOUTH EVERY DAY 90 tablet 1  . fish oil-omega-3 fatty acids 1000 MG capsule Take 1 g by mouth daily.      . furosemide (LASIX) 20 MG tablet TAKE 1 TABLET BY MOUTH EVERY DAY 90 tablet 2  . gabapentin (NEURONTIN) 300 MG capsule Take 1 capsule by mouth three times daily  for diabetic neuropathy. 270 capsule 2  . glucose blood (ONE TOUCH ULTRA TEST) test strip USE TO TEST UP TO 4 TIMES DAILY 100 each 2  . isosorbide mononitrate (IMDUR) 30 MG 24 hr tablet TAKE 1 TABLET BY MOUTH EVERY DAY 90 tablet 1  . JARDIANCE 10 MG TABS tablet TAKE 1 TABLET BY MOUTH EVERY MORNING FOR DIABETES. 90 tablet 1  . lisinopril (PRINIVIL,ZESTRIL) 5 MG tablet Take 1 tablet (5 mg total) by mouth daily. For blood pressure and kidney protection. 90 tablet 0  . metFORMIN (GLUCOPHAGE) 500 MG tablet TAKE 1 TABLET BY MOUTH TWICE DAILY WITH A MEAL 180 tablet 1  . metoprolol succinate (TOPROL XL) 25 MG 24 hr tablet Take 1 tablet (25 mg total) by mouth daily. 90 tablet 3  . nitroGLYCERIN (NITROSTAT) 0.4 MG SL tablet Place 1 tablet (0.4 mg total) under the tongue every 5 (five) minutes as needed for chest pain. 25 tablet 0  . pantoprazole (PROTONIX) 40 MG tablet TAKE 1 TABLET BY MOUTH ONCE DAILY 90 tablet 3  . rosuvastatin (CRESTOR) 20 MG tablet Take 1 tablet by mouth every evening for cholesterol. 90 tablet 2  . sitaGLIPtin (JANUVIA) 100 MG tablet Take 1 tablet by mouth once daily for diabetes. 90 tablet 2  . tamsulosin (FLOMAX) 0.4 MG CAPS capsule Take 1 capsule (0.4 mg total) by mouth daily. 30 capsule 11  . warfarin (COUMADIN) 1 MG tablet TAKE 1 TABLET ON MONDAYS. PT. USES 1 MG TABLETS IN ADDITION TO 5 MG TABLETS. 90 tablet 0  . warfarin (COUMADIN) 5 MG tablet TAKE 1 TABLET BY MOUTH ON MONDAYS AND 1/2 TABLET ALL OTHER DAYS 48 tablet 7   No current  facility-administered medications on file prior to visit.     BP 110/70   Pulse 86   Temp 98 F (36.7 C) (Oral)   Ht 5\' 11"  (1.803 m)   Wt 222 lb 8 oz (100.9 kg)   SpO2 99%   BMI 31.03 kg/m    Objective:   Physical Exam  Constitutional: He appears well-nourished.  Neck: Neck supple.  Cardiovascular: Normal rate and regular rhythm.  Respiratory: Effort normal and breath sounds normal.  Skin: Skin is warm and dry.  Psychiatric: He has a normal mood and affect.           Assessment & Plan:

## 2018-06-27 NOTE — Patient Instructions (Signed)
Begin taking Vitamin D and Calcium  Leuprolide injection What is this medicine? LEUPROLIDE (loo PROE lide) is a man-made hormone. It is used to treat the symptoms of prostate cancer. This medicine may also be used to treat children with early onset of puberty. It may be used for other hormonal conditions. This medicine may be used for other purposes; ask your health care provider or pharmacist if you have questions. COMMON BRAND NAME(S): Lupron What should I tell my health care provider before I take this medicine? They need to know if you have any of these conditions: -diabetes -heart disease or previous heart attack -high blood pressure -high cholesterol -pain or difficulty passing urine -spinal cord metastasis -stroke -tobacco smoker -an unusual or allergic reaction to leuprolide, benzyl alcohol, other medicines, foods, dyes, or preservatives -pregnant or trying to get pregnant -breast-feeding How should I use this medicine? This medicine is for injection under the skin or into a muscle. You will be taught how to prepare and give this medicine. Use exactly as directed. Take your medicine at regular intervals. Do not take your medicine more often than directed. It is important that you put your used needles and syringes in a special sharps container. Do not put them in a trash can. If you do not have a sharps container, call your pharmacist or healthcare provider to get one. A special MedGuide will be given to you by the pharmacist with each prescription and refill. Be sure to read this information carefully each time. Talk to your pediatrician regarding the use of this medicine in children. While this medicine may be prescribed for children as young as 8 years for selected conditions, precautions do apply. Overdosage: If you think you have taken too much of this medicine contact a poison control center or emergency room at once. NOTE: This medicine is only for you. Do not share this  medicine with others. What if I miss a dose? If you miss a dose, take it as soon as you can. If it is almost time for your next dose, take only that dose. Do not take double or extra doses. What may interact with this medicine? Do not take this medicine with any of the following medications: -chasteberry This medicine may also interact with the following medications: -herbal or dietary supplements, like black cohosh or DHEA -male hormones, like estrogens or progestins and birth control pills, patches, rings, or injections -male hormones, like testosterone This list may not describe all possible interactions. Give your health care provider a list of all the medicines, herbs, non-prescription drugs, or dietary supplements you use. Also tell them if you smoke, drink alcohol, or use illegal drugs. Some items may interact with your medicine. What should I watch for while using this medicine? Visit your doctor or health care professional for regular checks on your progress. During the first week, your symptoms may get worse, but then will improve as you continue your treatment. You may get hot flashes, increased bone pain, increased difficulty passing urine, or an aggravation of nerve symptoms. Discuss these effects with your doctor or health care professional, some of them may improve with continued use of this medicine. Male patients may experience a menstrual cycle or spotting during the first 2 months of therapy with this medicine. If this continues, contact your doctor or health care professional. What side effects may I notice from receiving this medicine? Side effects that you should report to your doctor or health care professional as soon as possible: -  allergic reactions like skin rash, itching or hives, swelling of the face, lips, or tongue -breathing problems -chest pain -depression or memory disorders -pain in your legs or groin -pain at site where injected -severe headache -swelling  of the feet and legs -visual changes -vomiting Side effects that usually do not require medical attention (report to your doctor or health care professional if they continue or are bothersome): -breast swelling or tenderness -decrease in sex drive or performance -diarrhea -hot flashes -loss of appetite -muscle, joint, or bone pains -nausea -redness or irritation at site where injected -skin problems or acne This list may not describe all possible side effects. Call your doctor for medical advice about side effects. You may report side effects to FDA at 1-800-FDA-1088. Where should I keep my medicine? Keep out of the reach of children. Store below 25 degrees C (77 degrees F). Do not freeze. Protect from light. Do not use if it is not clear or if there are particles present. Throw away any unused medicine after the expiration date. NOTE: This sheet is a summary. It may not cover all possible information. If you have questions about this medicine, talk to your doctor, pharmacist, or health care provider.  2019 Elsevier/Gold Standard (2015-11-22 10:54:35)

## 2018-06-28 ENCOUNTER — Ambulatory Visit: Payer: Medicare Other

## 2018-06-28 ENCOUNTER — Other Ambulatory Visit: Payer: Self-pay | Admitting: *Deleted

## 2018-06-28 DIAGNOSIS — C61 Malignant neoplasm of prostate: Secondary | ICD-10-CM

## 2018-06-29 ENCOUNTER — Other Ambulatory Visit: Payer: Self-pay

## 2018-06-29 ENCOUNTER — Ambulatory Visit (INDEPENDENT_AMBULATORY_CARE_PROVIDER_SITE_OTHER): Payer: Medicare Other | Admitting: Family Medicine

## 2018-06-29 VITALS — BP 102/74 | HR 68 | Temp 97.6°F | Resp 16 | Ht 71.0 in | Wt 217.0 lb

## 2018-06-29 DIAGNOSIS — J00 Acute nasopharyngitis [common cold]: Secondary | ICD-10-CM | POA: Diagnosis not present

## 2018-06-29 LAB — POC INFLUENZA A&B (BINAX/QUICKVUE)
Influenza A, POC: NEGATIVE
Influenza B, POC: NEGATIVE

## 2018-06-29 NOTE — Progress Notes (Signed)
Subjective:    Patient ID: [name] Cook, male    DOB: [birth year], 68 y.o.   MRN: [id number]  HPI   Patient is a clinic complaining of dry cough, runny nose and feeling rundown for the past 2 to 3 days.  Patient states whenever he has a cough, he always worries about go right to his chest and he will end up with pneumonia.  States he wants to "nip this in the bud"  Denies any fever or chills.  Denies chest pain.  Denies feeling short of breath or wheezing.  Denies nausea, vomiting or diarrhea.  Has not taken anything over-the-counter.  States he does have Flonase at home, but has not used it.  Patient Active Problem List   Diagnosis Date Noted  . Decreased renal function 06/27/2018  . Dizziness 06/13/2018  . Prostate cancer ([place]) 06/10/2018  . Chest pain 06/04/2018  . Left knee pain 10/25/2017  . OSA (obstructive sleep apnea) 10/01/2017  . Preventative health care 10/01/2017  . Osteoarthritis 06/26/2017  . Insomnia 10/01/2014  . Depression 03/09/2014  . Elevated PSA 02/09/2014  . Coronary artery disease 11/06/2013  . Long term current use of anticoagulant therapy 06/19/2013  . Pacemaker 04/30/2013  . Acute myocardial infarction, subendocardial infarction, initial episode of care ([place]) 03/15/2013  . Hypokalemia 01/15/2013  . Abnormal LFTs 05/02/2012  . Chronic kidney disease, 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/26/2011  . Chronic obstructive pulmonary disease ([place]) 04/20/2011  . Atrial fibrillation   . Hypertension   . Hyperlipidemia   . Diabetes mellitus type 2, uncomplicated ([place])   . Atrial flutter ([place])   . Tobacco abuse   . GASTROESOPHAGEAL REFLUX DISEASE 05/16/2010   Social History   Tobacco Use  . Smoking status: Former Smoker    Packs/day: 1.00    Years: 42.00    Pack years: 42.00    Types: Cigarettes    Last attempt to quit: 12/31/2011    Years since quitting: 6.4  . Smokeless tobacco: Never Used  Substance Use Topics  . Alcohol use: Not Currently    Comment: 03/14/2013  "stopped drinking back in 2002; never had problem w/it"   Review of Systems   Constitutional: +run down. No documented fever. No chills.   HENT: +runny nose. Negative for ear pain, sinus pain and sore throat.   Eyes: Negative.   Respiratory: +cough. Negative for shortness of breath and wheezing.   Cardiovascular: Negative for chest pain, palpitations and leg swelling.  Gastrointestinal: Negative for abdominal pain, diarrhea, nausea and vomiting.  Genitourinary: Negative for dysuria, frequency and urgency.  Musculoskeletal: Negative for arthralgias and myalgias.  Skin: Negative for color change, pallor and rash.  Neurological: Negative for syncope, light-headedness and headaches.  Psychiatric/Behavioral: The patient is not nervous/anxious.       Objective:   Physical Exam Vitals signs and nursing note reviewed.  Constitutional:      General: He is not in acute distress.    Appearance: He is not ill-appearing or toxic-appearing.  HENT:     Head: Normocephalic and atraumatic.     Nose: Congestion and rhinorrhea present.     Mouth/Throat:     Mouth: Mucous membranes are moist.  Eyes:     General: No scleral icterus.    Extraocular Movements: Extraocular movements intact.     Conjunctiva/sclera: Conjunctivae normal.     Pupils: Pupils are equal, round, and reactive to light.  Neck:     Musculoskeletal: Neck supple. No neck rigidity.  Cardiovascular:  Rate and Rhythm: Normal rate and regular rhythm.     Heart sounds: Normal heart sounds.  Pulmonary:     Effort: Pulmonary effort is normal. No respiratory distress.     Breath sounds: Normal breath sounds. No wheezing, rhonchi or rales.  Lymphadenopathy:     Cervical: No cervical adenopathy.  Skin:    General: Skin is warm and dry.     Coloration: Skin is not jaundiced or pale.  Neurological:     Mental Status: He is alert and oriented to person, place, and time.  Psychiatric:        Mood and Affect: Mood normal.         Behavior: Behavior normal.    Vitals:   06/29/18 1145  BP: 102/74  Pulse: 68  Resp: 16  Temp: 97.6 F (36.4 C)  SpO2: 95%      Assessment & Plan:   Common cold - patient's point-of-care flu test is negative in clinic.  Patient has no recent travel or been in contact with anyone under suspicion or known to have coronavirus.  Patient does not meet criteria for coronavirus testing.  Patient advised to use Flonase to help reduce congestion, use over-the-counter Robitussin cough syrup or Mucinex tablets to reduce cough symptoms, get plenty of rest, do good handwashing and remain home as much as possible to allow self time to rest to recover as well as reduce exposure to other illnesses.  Patient advised to monitor self for any worsening symptoms and if his symptoms do worsen, he has been advised to call office right away.  Also explained to patient that it is not good practice to just give antibiotics mild cough/congestion symptoms for pneumonia prevention, antibiotic overuse causes bacterial resistance, and there will be a time where we have very few antibiotics that work to kill bacteria.   Patient keep regular scheduled follow-up with PCP as planned.  Advised to return to clinic sooner if any issues arise or if current symptoms worsen.

## 2018-06-29 NOTE — Patient Instructions (Signed)
Use flonase, rest, lots of fluids  Monitor self for any worsening of symptoms, if getting worse call to let us know

## 2018-07-01 ENCOUNTER — Ambulatory Visit
Admission: RE | Admit: 2018-07-01 | Discharge: 2018-07-01 | Disposition: A | Discharge status: Home / Self Care | Payer: Medicare Other | Source: Ambulatory Visit | Attending: Radiation Oncology | Admitting: Radiation Oncology

## 2018-07-01 ENCOUNTER — Other Ambulatory Visit: Payer: Self-pay

## 2018-07-02 ENCOUNTER — Ambulatory Visit
Admission: RE | Admit: 2018-07-02 | Discharge: 2018-07-02 | Disposition: A | Discharge status: Home / Self Care | Payer: Medicare Other | Source: Ambulatory Visit | Attending: Radiation Oncology | Admitting: Radiation Oncology

## 2018-07-02 ENCOUNTER — Other Ambulatory Visit: Payer: Self-pay

## 2018-07-02 DIAGNOSIS — C61 Malignant neoplasm of prostate: Secondary | ICD-10-CM | POA: Diagnosis not present

## 2018-07-03 ENCOUNTER — Other Ambulatory Visit: Payer: Self-pay

## 2018-07-03 ENCOUNTER — Ambulatory Visit
Admission: RE | Admit: 2018-07-03 | Discharge: 2018-07-03 | Disposition: A | Discharge status: Home / Self Care | Payer: Medicare Other | Source: Ambulatory Visit | Attending: Radiation Oncology | Admitting: Radiation Oncology

## 2018-07-03 DIAGNOSIS — C61 Malignant neoplasm of prostate: Secondary | ICD-10-CM | POA: Diagnosis not present

## 2018-07-04 ENCOUNTER — Other Ambulatory Visit: Payer: Self-pay

## 2018-07-04 ENCOUNTER — Ambulatory Visit
Admission: RE | Admit: 2018-07-04 | Discharge: 2018-07-04 | Disposition: A | Discharge status: Home / Self Care | Payer: Medicare Other | Source: Ambulatory Visit | Attending: Radiation Oncology | Admitting: Radiation Oncology

## 2018-07-04 DIAGNOSIS — C61 Malignant neoplasm of prostate: Secondary | ICD-10-CM | POA: Diagnosis not present

## 2018-07-05 ENCOUNTER — Ambulatory Visit
Admission: RE | Admit: 2018-07-05 | Discharge: 2018-07-05 | Disposition: A | Discharge status: Home / Self Care | Payer: Medicare Other | Source: Ambulatory Visit | Attending: Radiation Oncology | Admitting: Radiation Oncology

## 2018-07-05 ENCOUNTER — Other Ambulatory Visit: Payer: Self-pay

## 2018-07-05 ENCOUNTER — Other Ambulatory Visit: Payer: Self-pay | Admitting: Primary Care

## 2018-07-05 DIAGNOSIS — C61 Malignant neoplasm of prostate: Secondary | ICD-10-CM | POA: Diagnosis not present

## 2018-07-07 ENCOUNTER — Other Ambulatory Visit: Payer: Self-pay

## 2018-07-08 ENCOUNTER — Other Ambulatory Visit: Payer: Self-pay

## 2018-07-08 ENCOUNTER — Ambulatory Visit
Admission: RE | Admit: 2018-07-08 | Discharge: 2018-07-08 | Disposition: A | Discharge status: Home / Self Care | Payer: Medicare Other | Source: Ambulatory Visit | Attending: Radiation Oncology | Admitting: Radiation Oncology

## 2018-07-08 DIAGNOSIS — C61 Malignant neoplasm of prostate: Secondary | ICD-10-CM | POA: Diagnosis not present

## 2018-07-09 ENCOUNTER — Ambulatory Visit
Admission: RE | Admit: 2018-07-09 | Discharge: 2018-07-09 | Disposition: A | Discharge status: Home / Self Care | Payer: Medicare Other | Source: Ambulatory Visit | Attending: Radiation Oncology | Admitting: Radiation Oncology

## 2018-07-09 ENCOUNTER — Other Ambulatory Visit: Payer: Self-pay

## 2018-07-09 DIAGNOSIS — C61 Malignant neoplasm of prostate: Secondary | ICD-10-CM | POA: Diagnosis not present

## 2018-07-10 ENCOUNTER — Ambulatory Visit
Admission: RE | Admit: 2018-07-10 | Discharge: 2018-07-10 | Disposition: A | Discharge status: Home / Self Care | Payer: Medicare Other | Source: Ambulatory Visit | Attending: Radiation Oncology | Admitting: Radiation Oncology

## 2018-07-10 ENCOUNTER — Other Ambulatory Visit: Payer: Self-pay

## 2018-07-10 DIAGNOSIS — C61 Malignant neoplasm of prostate: Secondary | ICD-10-CM | POA: Diagnosis not present

## 2018-07-11 ENCOUNTER — Ambulatory Visit
Admission: RE | Admit: 2018-07-11 | Discharge: 2018-07-11 | Disposition: A | Discharge status: Home / Self Care | Payer: Medicare Other | Source: Ambulatory Visit | Attending: Radiation Oncology | Admitting: Radiation Oncology

## 2018-07-11 ENCOUNTER — Other Ambulatory Visit: Payer: Self-pay

## 2018-07-11 DIAGNOSIS — C61 Malignant neoplasm of prostate: Secondary | ICD-10-CM | POA: Diagnosis not present

## 2018-07-12 ENCOUNTER — Ambulatory Visit
Admission: RE | Admit: 2018-07-12 | Discharge: 2018-07-12 | Disposition: A | Discharge status: Home / Self Care | Payer: Medicare Other | Source: Ambulatory Visit | Attending: Radiation Oncology | Admitting: Radiation Oncology

## 2018-07-12 ENCOUNTER — Other Ambulatory Visit: Payer: Self-pay

## 2018-07-12 DIAGNOSIS — C61 Malignant neoplasm of prostate: Secondary | ICD-10-CM | POA: Diagnosis not present

## 2018-07-15 ENCOUNTER — Ambulatory Visit
Admission: RE | Admit: 2018-07-15 | Discharge: 2018-07-15 | Disposition: A | Discharge status: Home / Self Care | Payer: Medicare Other | Source: Ambulatory Visit | Attending: Radiation Oncology | Admitting: Radiation Oncology

## 2018-07-15 ENCOUNTER — Other Ambulatory Visit: Payer: Self-pay

## 2018-07-15 ENCOUNTER — Other Ambulatory Visit: Payer: Self-pay | Admitting: Primary Care

## 2018-07-15 DIAGNOSIS — C61 Malignant neoplasm of prostate: Secondary | ICD-10-CM | POA: Diagnosis not present

## 2018-07-15 DIAGNOSIS — K219 Gastro-esophageal reflux disease without esophagitis: Secondary | ICD-10-CM

## 2018-07-15 NOTE — Telephone Encounter (Signed)
Have not prescribed. Last prescribed from previous PCP.Last office visit on 06/27/2018. Next future appointment on 09/23/2018

## 2018-07-15 NOTE — Telephone Encounter (Signed)
Noted, refill sent to pharmacy. 

## 2018-07-16 ENCOUNTER — Other Ambulatory Visit: Payer: Self-pay

## 2018-07-16 ENCOUNTER — Ambulatory Visit
Admission: RE | Admit: 2018-07-16 | Discharge: 2018-07-16 | Disposition: A | Discharge status: Home / Self Care | Payer: Medicare Other | Source: Ambulatory Visit | Attending: Radiation Oncology | Admitting: Radiation Oncology

## 2018-07-16 ENCOUNTER — Telehealth: Payer: Self-pay | Admitting: Primary Care

## 2018-07-16 DIAGNOSIS — C61 Malignant neoplasm of prostate: Secondary | ICD-10-CM | POA: Diagnosis not present

## 2018-07-16 NOTE — Telephone Encounter (Signed)
lvm asking pt to call office. His coumadin appt needs to be moved to 4/2 on the lab schedule in the morning.

## 2018-07-17 ENCOUNTER — Other Ambulatory Visit: Payer: Self-pay

## 2018-07-17 ENCOUNTER — Ambulatory Visit
Admission: RE | Admit: 2018-07-17 | Discharge: 2018-07-17 | Disposition: A | Discharge status: Home / Self Care | Payer: Medicare Other | Source: Ambulatory Visit | Attending: Radiation Oncology | Admitting: Radiation Oncology

## 2018-07-17 ENCOUNTER — Inpatient Hospital Stay: Payer: Medicare Other | Attending: Radiation Oncology

## 2018-07-17 DIAGNOSIS — Z950 Presence of cardiac pacemaker: Secondary | ICD-10-CM | POA: Insufficient documentation

## 2018-07-17 DIAGNOSIS — Z7901 Long term (current) use of anticoagulants: Secondary | ICD-10-CM | POA: Diagnosis not present

## 2018-07-17 DIAGNOSIS — I4819 Other persistent atrial fibrillation: Secondary | ICD-10-CM | POA: Diagnosis not present

## 2018-07-17 DIAGNOSIS — I10 Essential (primary) hypertension: Secondary | ICD-10-CM | POA: Diagnosis not present

## 2018-07-17 DIAGNOSIS — G4730 Sleep apnea, unspecified: Secondary | ICD-10-CM | POA: Diagnosis not present

## 2018-07-17 DIAGNOSIS — E1140 Type 2 diabetes mellitus with diabetic neuropathy, unspecified: Secondary | ICD-10-CM | POA: Insufficient documentation

## 2018-07-17 DIAGNOSIS — Z87891 Personal history of nicotine dependence: Secondary | ICD-10-CM | POA: Insufficient documentation

## 2018-07-17 DIAGNOSIS — I4892 Unspecified atrial flutter: Secondary | ICD-10-CM | POA: Diagnosis not present

## 2018-07-17 DIAGNOSIS — C61 Malignant neoplasm of prostate: Secondary | ICD-10-CM

## 2018-07-17 DIAGNOSIS — K219 Gastro-esophageal reflux disease without esophagitis: Secondary | ICD-10-CM | POA: Diagnosis not present

## 2018-07-17 DIAGNOSIS — J449 Chronic obstructive pulmonary disease, unspecified: Secondary | ICD-10-CM | POA: Diagnosis not present

## 2018-07-17 DIAGNOSIS — Z79899 Other long term (current) drug therapy: Secondary | ICD-10-CM | POA: Diagnosis not present

## 2018-07-17 DIAGNOSIS — I2510 Atherosclerotic heart disease of native coronary artery without angina pectoris: Secondary | ICD-10-CM | POA: Insufficient documentation

## 2018-07-17 DIAGNOSIS — Z7902 Long term (current) use of antithrombotics/antiplatelets: Secondary | ICD-10-CM | POA: Diagnosis not present

## 2018-07-17 DIAGNOSIS — E785 Hyperlipidemia, unspecified: Secondary | ICD-10-CM | POA: Insufficient documentation

## 2018-07-17 LAB — CBC
HCT: 47.3 % (ref 39.0–52.0)
Hemoglobin: 15.5 g/dL (ref 13.0–17.0)
MCH: 32.4 pg (ref 26.0–34.0)
MCHC: 32.8 g/dL (ref 30.0–36.0)
MCV: 99 fL (ref 80.0–100.0)
Platelets: 147 10*3/uL — ABNORMAL LOW (ref 150–400)
RBC: 4.78 MIL/uL (ref 4.22–5.81)
RDW: 14.4 % (ref 11.5–15.5)
WBC: 6.4 10*3/uL (ref 4.0–10.5)
nRBC: 0 % (ref 0.0–0.2)

## 2018-07-18 ENCOUNTER — Other Ambulatory Visit: Payer: Self-pay

## 2018-07-18 ENCOUNTER — Ambulatory Visit
Admission: RE | Admit: 2018-07-18 | Discharge: 2018-07-18 | Disposition: A | Discharge status: Home / Self Care | Payer: Medicare Other | Source: Ambulatory Visit | Attending: Radiation Oncology | Admitting: Radiation Oncology

## 2018-07-18 ENCOUNTER — Ambulatory Visit (INDEPENDENT_AMBULATORY_CARE_PROVIDER_SITE_OTHER): Payer: Medicare Other | Admitting: General Practice

## 2018-07-18 ENCOUNTER — Other Ambulatory Visit (INDEPENDENT_AMBULATORY_CARE_PROVIDER_SITE_OTHER): Payer: Medicare Other

## 2018-07-18 DIAGNOSIS — I4819 Other persistent atrial fibrillation: Secondary | ICD-10-CM

## 2018-07-18 DIAGNOSIS — C61 Malignant neoplasm of prostate: Secondary | ICD-10-CM | POA: Diagnosis not present

## 2018-07-18 LAB — PROTIME-INR
INR: 3.5 ratio — ABNORMAL HIGH (ref 0.8–1.0)
Prothrombin Time: 39.5 s — ABNORMAL HIGH (ref 9.6–13.1)

## 2018-07-18 NOTE — Patient Instructions (Addendum)
Pre visit review using our clinic review tool, if applicable. No additional management support is needed unless otherwise documented below in the visit note.  Skip dosage today (4/2) and then continue to take 1 tablet (2.5 mg) daily except 3.5 mg (1/2 of 5 mg tablet and 1 mg tablet) on Mondays.  Re-check in 3 to 4 weeks.Patient has been given dosing instructions and he verbalized understanding.

## 2018-07-19 ENCOUNTER — Other Ambulatory Visit: Payer: Self-pay

## 2018-07-19 ENCOUNTER — Ambulatory Visit
Admission: RE | Admit: 2018-07-19 | Discharge: 2018-07-19 | Disposition: A | Discharge status: Home / Self Care | Payer: Medicare Other | Source: Ambulatory Visit | Attending: Radiation Oncology | Admitting: Radiation Oncology

## 2018-07-19 DIAGNOSIS — C61 Malignant neoplasm of prostate: Secondary | ICD-10-CM | POA: Diagnosis not present

## 2018-07-22 ENCOUNTER — Other Ambulatory Visit: Payer: Self-pay

## 2018-07-22 ENCOUNTER — Ambulatory Visit
Admission: RE | Admit: 2018-07-22 | Discharge: 2018-07-22 | Disposition: A | Discharge status: Home / Self Care | Payer: Medicare Other | Source: Ambulatory Visit | Attending: Radiation Oncology | Admitting: Radiation Oncology

## 2018-07-22 DIAGNOSIS — C61 Malignant neoplasm of prostate: Secondary | ICD-10-CM | POA: Diagnosis not present

## 2018-07-23 ENCOUNTER — Other Ambulatory Visit: Payer: Self-pay

## 2018-07-23 ENCOUNTER — Ambulatory Visit: Payer: Medicare Other

## 2018-07-23 ENCOUNTER — Ambulatory Visit
Admission: RE | Admit: 2018-07-23 | Discharge: 2018-07-23 | Disposition: A | Discharge status: Home / Self Care | Payer: Medicare Other | Source: Ambulatory Visit | Attending: Radiation Oncology | Admitting: Radiation Oncology

## 2018-07-23 DIAGNOSIS — C61 Malignant neoplasm of prostate: Secondary | ICD-10-CM | POA: Diagnosis not present

## 2018-07-24 ENCOUNTER — Ambulatory Visit
Admission: RE | Admit: 2018-07-24 | Discharge: 2018-07-24 | Disposition: A | Discharge status: Home / Self Care | Payer: Medicare Other | Source: Ambulatory Visit | Attending: Radiation Oncology | Admitting: Radiation Oncology

## 2018-07-24 ENCOUNTER — Other Ambulatory Visit: Payer: Self-pay

## 2018-07-24 ENCOUNTER — Other Ambulatory Visit: Payer: Self-pay | Admitting: *Deleted

## 2018-07-24 DIAGNOSIS — C61 Malignant neoplasm of prostate: Secondary | ICD-10-CM | POA: Diagnosis not present

## 2018-07-24 MED ORDER — TAMSULOSIN HCL 0.4 MG PO CAPS: 0.4000 mg | ORAL_CAPSULE | Freq: Two times a day (BID) | ORAL | 6 refills | Status: DC

## 2018-07-25 ENCOUNTER — Other Ambulatory Visit: Payer: Self-pay

## 2018-07-25 ENCOUNTER — Ambulatory Visit
Admission: RE | Admit: 2018-07-25 | Discharge: 2018-07-25 | Disposition: A | Discharge status: Home / Self Care | Payer: Medicare Other | Source: Ambulatory Visit | Attending: Radiation Oncology | Admitting: Radiation Oncology

## 2018-07-25 DIAGNOSIS — C61 Malignant neoplasm of prostate: Secondary | ICD-10-CM | POA: Diagnosis not present

## 2018-07-26 ENCOUNTER — Ambulatory Visit
Admission: RE | Admit: 2018-07-26 | Discharge: 2018-07-26 | Disposition: A | Discharge status: Home / Self Care | Payer: Medicare Other | Source: Ambulatory Visit | Attending: Radiation Oncology | Admitting: Radiation Oncology

## 2018-07-26 ENCOUNTER — Other Ambulatory Visit: Payer: Self-pay

## 2018-07-26 DIAGNOSIS — C61 Malignant neoplasm of prostate: Secondary | ICD-10-CM | POA: Diagnosis not present

## 2018-07-29 ENCOUNTER — Other Ambulatory Visit: Payer: Self-pay

## 2018-07-29 ENCOUNTER — Ambulatory Visit
Admission: RE | Admit: 2018-07-29 | Discharge: 2018-07-29 | Disposition: A | Discharge status: Home / Self Care | Payer: Medicare Other | Source: Ambulatory Visit | Attending: Radiation Oncology | Admitting: Radiation Oncology

## 2018-07-29 DIAGNOSIS — C61 Malignant neoplasm of prostate: Secondary | ICD-10-CM | POA: Diagnosis not present

## 2018-07-30 ENCOUNTER — Other Ambulatory Visit: Payer: Self-pay

## 2018-07-30 ENCOUNTER — Ambulatory Visit
Admission: RE | Admit: 2018-07-30 | Discharge: 2018-07-30 | Disposition: A | Discharge status: Home / Self Care | Payer: Medicare Other | Source: Ambulatory Visit | Attending: Radiation Oncology | Admitting: Radiation Oncology

## 2018-07-30 DIAGNOSIS — C61 Malignant neoplasm of prostate: Secondary | ICD-10-CM | POA: Diagnosis not present

## 2018-07-31 ENCOUNTER — Ambulatory Visit
Admission: RE | Admit: 2018-07-31 | Discharge: 2018-07-31 | Disposition: A | Discharge status: Home / Self Care | Payer: Medicare Other | Source: Ambulatory Visit | Attending: Radiation Oncology | Admitting: Radiation Oncology

## 2018-07-31 ENCOUNTER — Other Ambulatory Visit: Payer: Self-pay

## 2018-07-31 ENCOUNTER — Inpatient Hospital Stay: Payer: Medicare Other

## 2018-07-31 DIAGNOSIS — C61 Malignant neoplasm of prostate: Secondary | ICD-10-CM | POA: Diagnosis not present

## 2018-07-31 LAB — CBC
HCT: 43.9 % (ref 39.0–52.0)
Hemoglobin: 14.5 g/dL (ref 13.0–17.0)
MCH: 32.2 pg (ref 26.0–34.0)
MCHC: 33 g/dL (ref 30.0–36.0)
MCV: 97.6 fL (ref 80.0–100.0)
Platelets: 124 10*3/uL — ABNORMAL LOW (ref 150–400)
RBC: 4.5 MIL/uL (ref 4.22–5.81)
RDW: 13.7 % (ref 11.5–15.5)
WBC: 7.6 10*3/uL (ref 4.0–10.5)
nRBC: 0 % (ref 0.0–0.2)

## 2018-08-01 ENCOUNTER — Other Ambulatory Visit: Payer: Self-pay

## 2018-08-01 ENCOUNTER — Ambulatory Visit
Admission: RE | Admit: 2018-08-01 | Discharge: 2018-08-01 | Disposition: A | Discharge status: Home / Self Care | Payer: Medicare Other | Source: Ambulatory Visit | Attending: Radiation Oncology | Admitting: Radiation Oncology

## 2018-08-01 DIAGNOSIS — C61 Malignant neoplasm of prostate: Secondary | ICD-10-CM | POA: Diagnosis not present

## 2018-08-02 ENCOUNTER — Other Ambulatory Visit: Payer: Self-pay

## 2018-08-02 ENCOUNTER — Ambulatory Visit
Admission: RE | Admit: 2018-08-02 | Discharge: 2018-08-02 | Disposition: A | Discharge status: Home / Self Care | Payer: Medicare Other | Source: Ambulatory Visit | Attending: Radiation Oncology | Admitting: Radiation Oncology

## 2018-08-02 DIAGNOSIS — C61 Malignant neoplasm of prostate: Secondary | ICD-10-CM | POA: Diagnosis not present

## 2018-08-05 ENCOUNTER — Other Ambulatory Visit: Payer: Self-pay

## 2018-08-05 ENCOUNTER — Ambulatory Visit
Admission: RE | Admit: 2018-08-05 | Discharge: 2018-08-05 | Disposition: A | Discharge status: Home / Self Care | Payer: Medicare Other | Source: Ambulatory Visit | Attending: Radiation Oncology | Admitting: Radiation Oncology

## 2018-08-05 DIAGNOSIS — C61 Malignant neoplasm of prostate: Secondary | ICD-10-CM | POA: Diagnosis not present

## 2018-08-06 ENCOUNTER — Other Ambulatory Visit: Payer: Self-pay

## 2018-08-06 ENCOUNTER — Ambulatory Visit
Admission: RE | Admit: 2018-08-06 | Discharge: 2018-08-06 | Disposition: A | Discharge status: Home / Self Care | Payer: Medicare Other | Source: Ambulatory Visit | Attending: Radiation Oncology | Admitting: Radiation Oncology

## 2018-08-06 DIAGNOSIS — C61 Malignant neoplasm of prostate: Secondary | ICD-10-CM | POA: Diagnosis not present

## 2018-08-07 ENCOUNTER — Other Ambulatory Visit: Payer: Self-pay

## 2018-08-07 ENCOUNTER — Ambulatory Visit
Admission: RE | Admit: 2018-08-07 | Discharge: 2018-08-07 | Disposition: A | Discharge status: Home / Self Care | Payer: Medicare Other | Source: Ambulatory Visit | Attending: Radiation Oncology | Admitting: Radiation Oncology

## 2018-08-07 ENCOUNTER — Ambulatory Visit (INDEPENDENT_AMBULATORY_CARE_PROVIDER_SITE_OTHER): Payer: Medicare Other | Admitting: *Deleted

## 2018-08-07 DIAGNOSIS — C61 Malignant neoplasm of prostate: Secondary | ICD-10-CM | POA: Diagnosis not present

## 2018-08-07 DIAGNOSIS — I442 Atrioventricular block, complete: Secondary | ICD-10-CM

## 2018-08-08 ENCOUNTER — Ambulatory Visit
Admission: RE | Admit: 2018-08-08 | Discharge: 2018-08-08 | Disposition: A | Discharge status: Home / Self Care | Payer: Medicare Other | Source: Ambulatory Visit | Attending: Radiation Oncology | Admitting: Radiation Oncology

## 2018-08-08 ENCOUNTER — Other Ambulatory Visit: Payer: Self-pay

## 2018-08-08 ENCOUNTER — Telehealth: Payer: Self-pay

## 2018-08-08 DIAGNOSIS — C61 Malignant neoplasm of prostate: Secondary | ICD-10-CM | POA: Diagnosis not present

## 2018-08-08 LAB — CUP PACEART REMOTE DEVICE CHECK
Battery Impedance: 301 Ohm
Battery Remaining Longevity: 112 mo
Battery Voltage: 2.79 V
Brady Statistic RV Percent Paced: 100 %
Date Time Interrogation Session: 20200423115019
Implantable Lead Implant Date: 20141010
Implantable Lead Implant Date: 20141010
Implantable Lead Location: 753859
Implantable Lead Location: 753860
Implantable Lead Model: 5076
Implantable Lead Model: 5076
Implantable Pulse Generator Implant Date: 20141010
Lead Channel Impedance Value: 641 Ohm
Lead Channel Pacing Threshold Amplitude: 0.75 V
Lead Channel Pacing Threshold Pulse Width: 0.4 ms
Lead Channel Setting Pacing Amplitude: 2.5 V
Lead Channel Setting Pacing Pulse Width: 0.4 ms
Lead Channel Setting Sensing Sensitivity: 4 mV

## 2018-08-08 NOTE — Telephone Encounter (Signed)
Spoke with patient to remind of missed remote transmission 

## 2018-08-09 ENCOUNTER — Other Ambulatory Visit: Payer: Self-pay

## 2018-08-09 ENCOUNTER — Ambulatory Visit
Admission: RE | Admit: 2018-08-09 | Discharge: 2018-08-09 | Disposition: A | Discharge status: Home / Self Care | Payer: Medicare Other | Source: Ambulatory Visit | Attending: Radiation Oncology | Admitting: Radiation Oncology

## 2018-08-09 DIAGNOSIS — C61 Malignant neoplasm of prostate: Secondary | ICD-10-CM | POA: Diagnosis not present

## 2018-08-12 ENCOUNTER — Other Ambulatory Visit: Payer: Self-pay

## 2018-08-12 ENCOUNTER — Ambulatory Visit
Admission: RE | Admit: 2018-08-12 | Discharge: 2018-08-12 | Disposition: A | Discharge status: Home / Self Care | Payer: Medicare Other | Source: Ambulatory Visit | Attending: Radiation Oncology | Admitting: Radiation Oncology

## 2018-08-12 DIAGNOSIS — C61 Malignant neoplasm of prostate: Secondary | ICD-10-CM | POA: Diagnosis not present

## 2018-08-13 ENCOUNTER — Ambulatory Visit
Admission: RE | Admit: 2018-08-13 | Discharge: 2018-08-13 | Disposition: A | Discharge status: Home / Self Care | Payer: Medicare Other | Source: Ambulatory Visit | Attending: Radiation Oncology | Admitting: Radiation Oncology

## 2018-08-13 ENCOUNTER — Other Ambulatory Visit: Payer: Self-pay

## 2018-08-13 DIAGNOSIS — C61 Malignant neoplasm of prostate: Secondary | ICD-10-CM | POA: Diagnosis not present

## 2018-08-14 ENCOUNTER — Other Ambulatory Visit: Payer: Self-pay

## 2018-08-14 ENCOUNTER — Inpatient Hospital Stay: Payer: Medicare Other

## 2018-08-14 ENCOUNTER — Ambulatory Visit
Admission: RE | Admit: 2018-08-14 | Discharge: 2018-08-14 | Disposition: A | Discharge status: Home / Self Care | Payer: Medicare Other | Source: Ambulatory Visit | Attending: Radiation Oncology | Admitting: Radiation Oncology

## 2018-08-14 DIAGNOSIS — C61 Malignant neoplasm of prostate: Secondary | ICD-10-CM

## 2018-08-14 LAB — CBC
HCT: 43 % (ref 39.0–52.0)
Hemoglobin: 14.3 g/dL (ref 13.0–17.0)
MCH: 32.4 pg (ref 26.0–34.0)
MCHC: 33.3 g/dL (ref 30.0–36.0)
MCV: 97.3 fL (ref 80.0–100.0)
Platelets: 136 10*3/uL — ABNORMAL LOW (ref 150–400)
RBC: 4.42 MIL/uL (ref 4.22–5.81)
RDW: 13.8 % (ref 11.5–15.5)
WBC: 6.1 10*3/uL (ref 4.0–10.5)
nRBC: 0 % (ref 0.0–0.2)

## 2018-08-15 ENCOUNTER — Other Ambulatory Visit: Payer: Self-pay

## 2018-08-15 ENCOUNTER — Encounter: Payer: Self-pay | Admitting: Cardiology

## 2018-08-15 ENCOUNTER — Ambulatory Visit (INDEPENDENT_AMBULATORY_CARE_PROVIDER_SITE_OTHER): Payer: Medicare Other | Admitting: General Practice

## 2018-08-15 ENCOUNTER — Other Ambulatory Visit (INDEPENDENT_AMBULATORY_CARE_PROVIDER_SITE_OTHER): Payer: Medicare Other

## 2018-08-15 ENCOUNTER — Ambulatory Visit
Admission: RE | Admit: 2018-08-15 | Discharge: 2018-08-15 | Disposition: A | Discharge status: Home / Self Care | Payer: Medicare Other | Source: Ambulatory Visit | Attending: Radiation Oncology | Admitting: Radiation Oncology

## 2018-08-15 DIAGNOSIS — I4819 Other persistent atrial fibrillation: Secondary | ICD-10-CM | POA: Diagnosis not present

## 2018-08-15 DIAGNOSIS — Z7901 Long term (current) use of anticoagulants: Secondary | ICD-10-CM | POA: Diagnosis not present

## 2018-08-15 DIAGNOSIS — C61 Malignant neoplasm of prostate: Secondary | ICD-10-CM | POA: Diagnosis not present

## 2018-08-15 LAB — POCT INR: INR: 3.4 — AB (ref 2.0–3.0)

## 2018-08-15 NOTE — Progress Notes (Signed)
Remote pacemaker transmission.   

## 2018-08-15 NOTE — Patient Instructions (Addendum)
Pre visit review using our clinic review tool, if applicable. No additional management support is needed unless otherwise documented below in the visit note.  Skip dosage today (4/30) and then decrease dosage and take  1 tablet (2.5 mg) daily except 5 mg on Mondays.  Re-check in 3 to 4 weeks on 5/28 @ 8:15.  Pt been given dosing instructions and he verbalized understanding.

## 2018-08-16 ENCOUNTER — Other Ambulatory Visit: Payer: Self-pay

## 2018-08-16 ENCOUNTER — Ambulatory Visit
Admission: RE | Admit: 2018-08-16 | Discharge: 2018-08-16 | Disposition: A | Discharge status: Home / Self Care | Payer: Medicare Other | Source: Ambulatory Visit | Attending: Radiation Oncology | Admitting: Radiation Oncology

## 2018-08-16 DIAGNOSIS — G4730 Sleep apnea, unspecified: Secondary | ICD-10-CM | POA: Diagnosis not present

## 2018-08-16 DIAGNOSIS — C61 Malignant neoplasm of prostate: Secondary | ICD-10-CM | POA: Diagnosis not present

## 2018-08-16 DIAGNOSIS — J449 Chronic obstructive pulmonary disease, unspecified: Secondary | ICD-10-CM | POA: Insufficient documentation

## 2018-08-16 DIAGNOSIS — I4892 Unspecified atrial flutter: Secondary | ICD-10-CM | POA: Diagnosis not present

## 2018-08-16 DIAGNOSIS — Z79899 Other long term (current) drug therapy: Secondary | ICD-10-CM | POA: Diagnosis not present

## 2018-08-16 DIAGNOSIS — I10 Essential (primary) hypertension: Secondary | ICD-10-CM | POA: Diagnosis not present

## 2018-08-16 DIAGNOSIS — Z7901 Long term (current) use of anticoagulants: Secondary | ICD-10-CM | POA: Insufficient documentation

## 2018-08-16 DIAGNOSIS — Z950 Presence of cardiac pacemaker: Secondary | ICD-10-CM | POA: Diagnosis not present

## 2018-08-16 DIAGNOSIS — Z7902 Long term (current) use of antithrombotics/antiplatelets: Secondary | ICD-10-CM | POA: Insufficient documentation

## 2018-08-16 DIAGNOSIS — E1140 Type 2 diabetes mellitus with diabetic neuropathy, unspecified: Secondary | ICD-10-CM | POA: Insufficient documentation

## 2018-08-16 DIAGNOSIS — E785 Hyperlipidemia, unspecified: Secondary | ICD-10-CM | POA: Insufficient documentation

## 2018-08-16 DIAGNOSIS — Z87891 Personal history of nicotine dependence: Secondary | ICD-10-CM | POA: Diagnosis not present

## 2018-08-16 DIAGNOSIS — I4819 Other persistent atrial fibrillation: Secondary | ICD-10-CM | POA: Diagnosis not present

## 2018-08-16 DIAGNOSIS — K219 Gastro-esophageal reflux disease without esophagitis: Secondary | ICD-10-CM | POA: Diagnosis not present

## 2018-08-16 DIAGNOSIS — I2510 Atherosclerotic heart disease of native coronary artery without angina pectoris: Secondary | ICD-10-CM | POA: Insufficient documentation

## 2018-08-19 ENCOUNTER — Ambulatory Visit
Admission: RE | Admit: 2018-08-19 | Discharge: 2018-08-19 | Disposition: A | Discharge status: Home / Self Care | Payer: Medicare Other | Source: Ambulatory Visit | Attending: Radiation Oncology | Admitting: Radiation Oncology

## 2018-08-19 ENCOUNTER — Other Ambulatory Visit: Payer: Self-pay

## 2018-08-19 DIAGNOSIS — C61 Malignant neoplasm of prostate: Secondary | ICD-10-CM | POA: Diagnosis not present

## 2018-08-20 ENCOUNTER — Ambulatory Visit
Admission: RE | Admit: 2018-08-20 | Discharge: 2018-08-20 | Disposition: A | Discharge status: Home / Self Care | Payer: Medicare Other | Source: Ambulatory Visit | Attending: Radiation Oncology | Admitting: Radiation Oncology

## 2018-08-20 ENCOUNTER — Other Ambulatory Visit: Payer: Self-pay

## 2018-08-20 DIAGNOSIS — C61 Malignant neoplasm of prostate: Secondary | ICD-10-CM | POA: Diagnosis not present

## 2018-08-21 ENCOUNTER — Other Ambulatory Visit: Payer: Self-pay

## 2018-08-21 ENCOUNTER — Ambulatory Visit
Admission: RE | Admit: 2018-08-21 | Discharge: 2018-08-21 | Disposition: A | Discharge status: Home / Self Care | Payer: Medicare Other | Source: Ambulatory Visit | Attending: Radiation Oncology | Admitting: Radiation Oncology

## 2018-08-21 DIAGNOSIS — C61 Malignant neoplasm of prostate: Secondary | ICD-10-CM | POA: Diagnosis not present

## 2018-08-22 ENCOUNTER — Other Ambulatory Visit: Payer: Self-pay

## 2018-08-22 ENCOUNTER — Ambulatory Visit
Admission: RE | Admit: 2018-08-22 | Discharge: 2018-08-22 | Disposition: A | Discharge status: Home / Self Care | Payer: Medicare Other | Source: Ambulatory Visit | Attending: Radiation Oncology | Admitting: Radiation Oncology

## 2018-08-22 DIAGNOSIS — C61 Malignant neoplasm of prostate: Secondary | ICD-10-CM | POA: Diagnosis not present

## 2018-08-23 ENCOUNTER — Other Ambulatory Visit: Payer: Self-pay

## 2018-08-23 ENCOUNTER — Ambulatory Visit
Admission: RE | Admit: 2018-08-23 | Discharge: 2018-08-23 | Disposition: A | Discharge status: Home / Self Care | Payer: Medicare Other | Source: Ambulatory Visit | Attending: Radiation Oncology | Admitting: Radiation Oncology

## 2018-08-23 DIAGNOSIS — C61 Malignant neoplasm of prostate: Secondary | ICD-10-CM | POA: Diagnosis not present

## 2018-08-26 ENCOUNTER — Other Ambulatory Visit: Payer: Self-pay

## 2018-08-26 ENCOUNTER — Ambulatory Visit
Admission: RE | Admit: 2018-08-26 | Discharge: 2018-08-26 | Disposition: A | Discharge status: Home / Self Care | Payer: Medicare Other | Source: Ambulatory Visit | Attending: Radiation Oncology | Admitting: Radiation Oncology

## 2018-08-26 DIAGNOSIS — C61 Malignant neoplasm of prostate: Secondary | ICD-10-CM | POA: Diagnosis not present

## 2018-08-31 ENCOUNTER — Other Ambulatory Visit: Payer: Self-pay | Admitting: Primary Care

## 2018-08-31 DIAGNOSIS — E1142 Type 2 diabetes mellitus with diabetic polyneuropathy: Secondary | ICD-10-CM

## 2018-09-04 ENCOUNTER — Other Ambulatory Visit: Payer: Self-pay | Admitting: Primary Care

## 2018-09-04 DIAGNOSIS — I10 Essential (primary) hypertension: Secondary | ICD-10-CM

## 2018-09-12 ENCOUNTER — Ambulatory Visit (INDEPENDENT_AMBULATORY_CARE_PROVIDER_SITE_OTHER): Payer: Medicare Other | Admitting: General Practice

## 2018-09-12 ENCOUNTER — Other Ambulatory Visit (INDEPENDENT_AMBULATORY_CARE_PROVIDER_SITE_OTHER): Payer: Medicare Other

## 2018-09-12 DIAGNOSIS — I4819 Other persistent atrial fibrillation: Secondary | ICD-10-CM

## 2018-09-12 DIAGNOSIS — Z7901 Long term (current) use of anticoagulants: Secondary | ICD-10-CM | POA: Diagnosis not present

## 2018-09-12 LAB — POCT INR: INR: 3 (ref 2.0–3.0)

## 2018-09-12 NOTE — Patient Instructions (Addendum)
Pre visit review using our clinic review tool, if applicable. No additional management support is needed unless otherwise documented below in the visit note.  Continue to take 1/2  tablet (2.5 mg) daily.    Re-check in 4 weeks .  Pt. has been given dosing instructions and he verbalized understanding.

## 2018-09-18 ENCOUNTER — Other Ambulatory Visit: Payer: Self-pay | Admitting: Primary Care

## 2018-09-18 DIAGNOSIS — F339 Major depressive disorder, recurrent, unspecified: Secondary | ICD-10-CM

## 2018-09-20 ENCOUNTER — Other Ambulatory Visit: Payer: Self-pay | Admitting: Primary Care

## 2018-09-23 ENCOUNTER — Other Ambulatory Visit: Payer: Self-pay | Admitting: Primary Care

## 2018-09-23 ENCOUNTER — Other Ambulatory Visit: Payer: Medicare Other

## 2018-09-23 ENCOUNTER — Other Ambulatory Visit: Payer: Self-pay

## 2018-09-23 ENCOUNTER — Ambulatory Visit (INDEPENDENT_AMBULATORY_CARE_PROVIDER_SITE_OTHER): Payer: Medicare Other

## 2018-09-23 DIAGNOSIS — Z0000 Encounter for general adult medical examination without abnormal findings: Secondary | ICD-10-CM | POA: Diagnosis not present

## 2018-09-23 NOTE — Progress Notes (Signed)
PCP notes:   Health maintenance:  Eye exam - addressed Colonoscopy- addressed PPSV23 - addressed  Abnormal screenings:   Mini-Cog score: 14/17 MMSE - Mini Mental State Exam 09/23/2018 09/21/2017  Orientation to time 5 5  Orientation to Place 5 5  Registration 3 3  Attention/ Calculation 0 0  Recall 0 1  Recall-comments unable to recall 0 of 3 words unable to recall 2 of 3 words  Language- name 2 objects 0 0  Language- repeat 1 1  Language- follow 3 step command 0 3  Language- read & follow direction 0 0  Write a sentence 0 0  Copy design 0 0  Total score 14 18    Patient concerns:   None  Nurse concerns:  None  Next PCP appt:   10/03/18 @ 1100

## 2018-09-23 NOTE — Progress Notes (Signed)
Subjective:   [name] Cook is a 68 y.o. male who presents for Medicare Annual (Subsequent) preventive examination.  Review of Systems:  N/A Cardiac Risk Factors include: advanced age (>72men, >26 women);male gender     Objective:     Vitals: There were no vitals taken for this visit.  There is no height or weight on file to calculate BMI.  Advanced Directives 09/23/2018 06/07/2018 06/05/2018 05/30/2018 11/28/2017 09/21/2017 04/08/2015  Does Patient Have a Medical Advance Directive? No No No No No No No  Would patient like information on creating a medical advance directive? No - Patient declined Yes (Inpatient - patient requests chaplain consult to create a medical advance directive) Yes (Inpatient - patient requests chaplain consult to create a medical advance directive) No - Patient declined No - Patient declined Yes (MAU/Ambulatory/Procedural Areas - Information given) No - patient declined information  Pre-existing out of facility DNR order (yellow form or pink MOST form) - - - - - - -    Tobacco Social History   Tobacco Use  Smoking Status Former Smoker  . Packs/day: 1.00  . Years: 42.00  . Pack years: 42.00  . Types: Cigarettes  . Last attempt to quit: 12/31/2011  . Years since quitting: 6.7  Smokeless Tobacco Never Used     Counseling given: No   Clinical Intake:  Pre-visit preparation completed: Yes  Pain : No/denies pain Pain Score: 0-No pain     Nutritional Status: BMI > 30  Obese Nutritional Risks: None Diabetes: Yes CBG done?: No Did pt. bring in CBG monitor from home?: No  How often do you need to have someone help you when you read instructions, pamphlets, or other written materials from your doctor or pharmacy?: 1 - Never What is the last grade level you completed in school?: 9th grade  Interpreter Needed?: No  Comments: pt lives with spouse Information entered by :: LPinson, LPN  Past Medical History:  Diagnosis Date  . Arthritis    "knees and  lower back" (03/14/2013)  . Atrial flutter ([place])    radiofrequency ablation in 2001  . CAD (coronary artery disease)    a. Nonobstructive. Cardiac cath in 2001-50% mid RI, normal LM, LAD, RCA b. cath 10/16/2014 95% mid RCA treated with DES, 99% ost D1 medical management due to small aneurysmal segment  . Chronic anticoagulation    chronic Coumadin anticoagulation  . Chronic obstructive pulmonary disease ([place]) 04/20/2011  . Diabetes mellitus, type 2 ([place])   . GERD (gastroesophageal reflux disease)   . Hyperlipidemia   . Hypertension    with hypertensive heart disease  . Obesity   . Persistent atrial fibrillation    recurrent atrial flutter since 2001 s/p DCCVs, multiple failed AADs, h/o tachy-mediated cardiomyopathy  . Shortness of breath    "can come on at any time" (03/14/2013)  . Sleep apnea    "dx'd; couldn't wear the mask" (03/14/2013)  . Tobacco abuse    Past Surgical History:  Procedure Laterality Date  . ATRIAL FLUTTER ABLATION  2002   atrial flutter; subsequently developed atrial fibrillation  . AV NODE ABLATION  01/24/2013  . CARDIAC CATHETERIZATION  2002  . CARDIAC CATHETERIZATION N/A 10/16/2014   Procedure: Left Heart Cath and Coronary Angiography;  Surgeon: [name], MD;  Location: [facility];  Service: Cardiovascular;  Laterality: N/A;  . CARDIOVERSION  05/31/2011   Procedure: CARDIOVERSION;  Surgeon: Cristopher Estimable. Lattie Haw, MD;  Location: [facility];  Service: Cardiovascular;  Laterality: N/A;  .  CARPAL TUNNEL RELEASE Left 1980's  . INSERT / REPLACE / REMOVE PACEMAKER  01/24/2013    Medtronic Adapta L dual-chamber pacemaker, serial number [id number]   . LEFT HEART CATH AND CORONARY ANGIOGRAPHY N/A 06/07/2018   Procedure: LEFT HEART CATH AND CORONARY ANGIOGRAPHY;  Surgeon: [name], MD;  Location: [facility];  Service: Cardiovascular;  Laterality: N/A;  . LEFT HEART CATHETERIZATION WITH CORONARY ANGIOGRAM N/A 03/17/2013   Procedure: LEFT HEART  CATHETERIZATION WITH CORONARY ANGIOGRAM;  Surgeon: Burnell Blanks, MD; LAD mild dz, D1 branch 100%, inferior branch 99%, CFX OK, RCA 50%, EF 65%    . LOOP RECORDER IMPLANT  2002  . PERMANENT PACEMAKER INSERTION N/A 01/24/2013   Procedure: PERMANENT PACEMAKER INSERTION;  Surgeon: [name], MD;  Location: [facility];  Service: Cardiovascular;  Laterality: N/A;  . TIBIAL TUBERCLERPLASTY  ~ 2003   Family History  Problem Relation Age of Onset  . Alzheimer's disease Mother   . Osteoporosis Mother    Social History   Socioeconomic History  . Marital status: Widowed    Spouse name: Not on file  . Number of children: 1  . Years of education: Not on file  . Highest education level: Not on file  Occupational History  . Occupation: Merchandiser, retail: UNEMPLOYED  Social Needs  . Financial resource strain: Not on file  . Food insecurity:    Worry: Not on file    Inability: Not on file  . Transportation needs:    Medical: Not on file    Non-medical: Not on file  Tobacco Use  . Smoking status: Former Smoker    Packs/day: 1.00    Years: 42.00    Pack years: 42.00    Types: Cigarettes    Last attempt to quit: 12/31/2011    Years since quitting: 6.7  . Smokeless tobacco: Never Used  Substance and Sexual Activity  . Alcohol use: Not Currently    Comment: 03/14/2013 "stopped drinking back in 2002; never had problem w/it"  . Drug use: No  . Sexual activity: Not Currently  Lifestyle  . Physical activity:    Days per week: Not on file    Minutes per session: Not on file  . Stress: Not on file  Relationships  . Social connections:    Talks on phone: Not on file    Gets together: Not on file    Attends religious service: Not on file    Active member of club or organization: Not on file    Attends meetings of clubs or organizations: Not on file    Relationship status: Not on file  Other Topics Concern  . Not on file  Social History Narrative   Single.   Retired.     1 son, deceased.    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   Enjoys playing on the computer.        Outpatient Encounter Medications as of 09/23/2018  Medication Sig  . buPROPion (ZYBAN) 150 MG 12 hr tablet TAKE 1 TABLET BY MOUTH TWICE A DAY  . clopidogrel (PLAVIX) 75 MG tablet TAKE 1 TABLET BY MOUTH EVERY DAY  . escitalopram (LEXAPRO) 20 MG tablet TAKE 1 TABLET (20 MG TOTAL) BY MOUTH DAILY. FOR DEPRESSION.  . fish oil-omega-3 fatty acids 1000 MG capsule Take 1 g by mouth daily.    . furosemide (LASIX) 20 MG tablet TAKE 1 TABLET BY MOUTH EVERY DAY  .  gabapentin (NEURONTIN) 300 MG capsule TAKE 1 CAPSULE BY MOUTH THREE TIMES DAILY FOR DIABETIC NEUROPATHY.  Marland Kitchen glucose blood (ONE TOUCH ULTRA TEST) test strip USE TO TEST UP TO 4 TIMES DAILY  . isosorbide mononitrate (IMDUR) 30 MG 24 hr tablet TAKE 1 TABLET BY MOUTH EVERY DAY  . JARDIANCE 10 MG TABS tablet TAKE 1 TABLET BY MOUTH EVERY MORNING FOR DIABETES.  Marland Kitchen lisinopril (ZESTRIL) 5 MG tablet TAKE 1 TABLET (5 MG TOTAL) BY MOUTH DAILY. FOR BLOOD PRESSURE AND KIDNEY PROTECTION.  . metFORMIN (GLUCOPHAGE) 500 MG tablet TAKE 1 TABLET BY MOUTH TWICE DAILY WITH A MEAL  . metoprolol succinate (TOPROL XL) 25 MG 24 hr tablet Take 1 tablet (25 mg total) by mouth daily.  . nitroGLYCERIN (NITROSTAT) 0.4 MG SL tablet Place 1 tablet (0.4 mg total) under the tongue every 5 (five) minutes as needed for chest pain.  . pantoprazole (PROTONIX) 40 MG tablet Take 1 tablet (40 mg total) by mouth daily. For heartburn.  . rosuvastatin (CRESTOR) 20 MG tablet Take 1 tablet by mouth every evening for cholesterol.  . sitaGLIPtin (JANUVIA) 100 MG tablet Take 1 tablet by mouth once daily for diabetes.  . tamsulosin (FLOMAX) 0.4 MG CAPS capsule Take 1 capsule (0.4 mg total) by mouth 2 (two) times daily.  Marland Kitchen warfarin (COUMADIN) 1 MG tablet TAKE 1 TABLET ON MONDAYS. PT. USES 1 MG TABLETS IN ADDITION TO 5 MG TABLETS.  Marland Kitchen warfarin (COUMADIN) 5  MG tablet TAKE 1 TABLET BY MOUTH ON MONDAYS AND 1/2 TABLET ALL OTHER DAYS   No facility-administered encounter medications on file as of 09/23/2018.     Activities of Daily Living In your present state of health, do you have any difficulty performing the following activities: 09/23/2018 06/07/2018  Hearing? N N  Vision? N N  Difficulty concentrating or making decisions? N N  Walking or climbing stairs? N N  Dressing or bathing? N N  Doing errands, shopping? N N  Preparing Food and eating ? N -  Using the Toilet? N -  In the past six months, have you accidently leaked urine? N -  Do you have problems with loss of bowel control? N -  Managing your Medications? N -  Managing your Finances? N -  Housekeeping or managing your Housekeeping? N -  Some recent data might be hidden    Patient Care Team: [name], NP as PCP - General (Internal Medicine) [name], MD as PCP - Cardiology (Cardiology) Lattie Haw Cristopher Estimable, MD (Cardiology)    Assessment:   This is a routine wellness examination for Mabel.  Vision Screening Comments: Vision exam in 2019 with Dr. Valma Cava  Exercise Activities and Dietary recommendations Current Exercise Habits: The patient does not participate in regular exercise at present, Exercise limited by: None identified  Goals    . Patient Stated     Starting 09/23/2018, I will continue to take medications as prescribed.        Fall Risk Fall Risk  09/23/2018 09/21/2017 05/31/2017 01/22/2017 08/25/2015  Falls in the past year? 0 No No No No   Depression Screen PHQ 2/9 Scores 09/23/2018 06/27/2018 09/21/2017 05/31/2017  PHQ - 2 Score 0 6 0 0  PHQ- 9 Score 0 16 0 -     Cognitive Function MMSE - Mini Mental State Exam 09/23/2018 09/21/2017  Orientation to time 5 5  Orientation to Place 5 5  Registration 3 3  Attention/ Calculation 0 0  Recall 0 1  Recall-comments unable to recall 0 of 3 words unable to recall 2 of 3 words  Language- name 2 objects 0 0  Language-  repeat 1 1  Language- follow 3 step command 0 3  Language- read & follow direction 0 0  Write a sentence 0 0  Copy design 0 0  Total score 14 18     PLEASE NOTE: A Mini-Cog screen was completed. Maximum score is 17. A value of 0 denotes this part of Folstein MMSE was not completed or the patient failed this part of the Mini-Cog screening.   Mini-Cog Screening Orientation to Time - Max 5 pts Orientation to Place - Max 5 pts Registration - Max 3 pts Recall - Max 3 pts Language Repeat - Max 1 pts      Immunization History  Administered Date(s) Administered  . Influenza, High Dose Seasonal PF 02/20/2018  . Influenza,inj,Quad PF,6+ Mos 02/05/2014, 01/04/2015, 04/20/2016  . Influenza-Unspecified 01/05/2011, 01/15/2013  . Pneumococcal Conjugate-13 09/21/2017  . Pneumococcal Polysaccharide-23 01/05/2011  . Tdap 01/05/2011    Screening Tests Health Maintenance  Topic Date Due  . OPHTHALMOLOGY EXAM  04/17/2019 (Originally 08/16/2018)  . COLONOSCOPY  04/17/2019 (Originally 05/17/2002)  . PNA vac Low Risk Adult (2 of 2 - PPSV23) 04/17/2019 (Originally 09/22/2018)  . HEMOGLOBIN A1C  10/02/2018  . INFLUENZA VACCINE  11/16/2018  . FOOT EXAM  04/03/2019  . TETANUS/TDAP  01/04/2021  . Hepatitis C Screening  Completed       Plan:     I have personally reviewed, addressed, and noted the following in the patient's chart:  A. Medical and social history B. Use of alcohol, tobacco or illicit drugs  C. Current medications and supplements D. Functional ability and status E.  Nutritional status F.  Physical activity G. Advance directives H. List of other physicians I.  Hospitalizations, surgeries, and ER visits in previous 12 months J.  Vitals (unless it is a telemedicine encounter) K. Screenings to include cognitive, depression, hearing, vision (NOTE: hearing and vision screenings not completed in telemedicine encounter) L. Referrals and appointments   In addition, I have reviewed and  discussed with patient certain preventive protocols, quality metrics, and best practice recommendations. A written personalized care plan for preventive services and recommendations were provided to patient.  With patient's permission, we connected on 09/23/18 at 12:00 PM EDT.Interactive audio and video telecommunications were attempted with patient. This attempt was unsuccessful due to patient having technical difficulties OR patient did not have access to video capability.  Encounter was completed with audio only.  Two patient identifiers were used to ensure the encounter occurred with the correct person. Patient was in home and writer was in office.   Signed,   Lindell Noe, MHA, BS, LPN Health Coach

## 2018-09-23 NOTE — Patient Instructions (Signed)
[name] Cook , Thank you for taking time to come for your Medicare Wellness Visit. I appreciate your ongoing commitment to your health goals. Please review the following plan we discussed and let me know if I can assist you in the future.   These are the goals we discussed: Goals    . Patient Stated     Starting 09/23/2018, I will continue to take medications as prescribed.        This is a list of the screening recommended for you and due dates:  Health Maintenance  Topic Date Due  . Colon Cancer Screening  05/17/2002  . Eye exam for diabetics  08/16/2018  . Pneumonia vaccines (2 of 2 - PPSV23) 09/22/2018  . Hemoglobin A1C  10/02/2018  . Flu Shot  11/16/2018  . Complete foot exam   04/03/2019  . Tetanus Vaccine  01/04/2021  .  Hepatitis C: One time screening is recommended by Center for Disease Control  (CDC) for  adults born from [birth year] through 1963.   Completed   Preventive Care for Adults  A healthy lifestyle and preventive care can promote health and wellness. Preventive health guidelines for adults include the following key practices.  . A routine yearly physical is a good way to check with your health care provider about your health and preventive screening. It is a chance to share any concerns and updates on your health and to receive a thorough exam.  . Visit your dentist for a routine exam and preventive care every 6 months. Brush your teeth twice a day and floss once a day. Good oral hygiene prevents tooth decay and gum disease.  . The frequency of eye exams is based on your age, health, family medical history, use  of contact lenses, and other factors. Follow your health care provider's recommendations for frequency of eye exams.  . Eat a healthy diet. Foods like vegetables, fruits, whole grains, low-fat dairy products, and lean protein foods contain the nutrients you need without too many calories. Decrease your intake of foods high in solid fats, added sugars, and salt. Eat  the right amount of calories for you. Get information about a proper diet from your health care provider, if necessary.  . Regular physical exercise is one of the most important things you can do for your health. Most adults should get at least 150 minutes of moderate-intensity exercise (any activity that increases your heart rate and causes you to sweat) each week. In addition, most adults need muscle-strengthening exercises on 2 or more days a week.  Silver Sneakers may be a benefit available to you. To determine eligibility, you may visit the website: www.silversneakers.com or contact program at 610-304-8576 Mon-Fri between 8AM-8PM.   . Maintain a healthy weight. The body mass index (BMI) is a screening tool to identify possible weight problems. It provides an estimate of body fat based on height and weight. Your health care provider can find your BMI and can help you achieve or maintain a healthy weight.   For adults 20 years and older: ? A BMI below 18.5 is considered underweight. ? A BMI of 18.5 to 24.9 is normal. ? A BMI of 25 to 29.9 is considered overweight. ? A BMI of 30 and above is considered obese.   . Maintain normal blood lipids and cholesterol levels by exercising and minimizing your intake of saturated fat. Eat a balanced diet with plenty of fruit and vegetables. Blood tests for lipids and cholesterol should begin at age  20 and be repeated every 5 years. If your lipid or cholesterol levels are high, you are over 50, or you are at high risk for heart disease, you may need your cholesterol levels checked more frequently. Ongoing high lipid and cholesterol levels should be treated with medicines if diet and exercise are not working.  . If you smoke, find out from your health care provider how to quit. If you do not use tobacco, please do not start.  . If you choose to drink alcohol, please do not consume more than 2 drinks per day. One drink is considered to be 12 ounces (355 mL)  of beer, 5 ounces (148 mL) of wine, or 1.5 ounces (44 mL) of liquor.  . If you are 84-85 years old, ask your health care provider if you should take aspirin to prevent strokes.  . Use sunscreen. Apply sunscreen liberally and repeatedly throughout the day. You should seek shade when your shadow is shorter than you. Protect yourself by wearing long sleeves, pants, a wide-brimmed hat, and sunglasses year round, whenever you are outdoors.  . Once a month, do a whole body skin exam, using a mirror to look at the skin on your back. Tell your health care provider of new moles, moles that have irregular borders, moles that are larger than a pencil eraser, or moles that have changed in shape or color.

## 2018-09-24 NOTE — Progress Notes (Signed)
I reviewed health advisor's note, was available for consultation, and agree with documentation and plan.  

## 2018-09-25 ENCOUNTER — Encounter: Payer: Medicare Other | Admitting: Primary Care

## 2018-09-27 ENCOUNTER — Encounter: Payer: Self-pay | Admitting: Radiation Oncology

## 2018-09-27 ENCOUNTER — Other Ambulatory Visit: Payer: Medicare Other

## 2018-09-27 ENCOUNTER — Ambulatory Visit
Admission: RE | Admit: 2018-09-27 | Discharge: 2018-09-27 | Disposition: A | Discharge status: Home / Self Care | Payer: Medicare Other | Source: Ambulatory Visit | Attending: Radiation Oncology | Admitting: Radiation Oncology

## 2018-09-27 ENCOUNTER — Other Ambulatory Visit: Payer: Self-pay | Admitting: *Deleted

## 2018-09-27 ENCOUNTER — Other Ambulatory Visit: Payer: Self-pay

## 2018-09-27 DIAGNOSIS — C61 Malignant neoplasm of prostate: Secondary | ICD-10-CM

## 2018-09-27 NOTE — Progress Notes (Signed)
Radiation Oncology Follow up Note  Name: [name] Cook   Date:   09/27/2018 MRN:  [id number] DOB: 1951-01-29    This 68 y.o. male presents to the clinic today for 1 month follow-up status post IMRT radiation therapy to his prostate for Gleason 7 adenocarcinoma the prostate presenting with a PSA of 6.1.  REFERRING PROVIDER: [name], NP  HPI: Patient is a 68 year old male now about 1 month having completed IMRT radiation therapy for Gleason 7 (3+4) adenocarcinoma the prostate presenting with a PSA of 6.1.  He is seen today in routine follow-up is doing well.  He specifically denies diarrhea.  He does have some urgency and frequency during the day no real nocturia.  He is on Flomax although not taking it regularly..  COMPLICATIONS OF TREATMENT: none  FOLLOW UP COMPLIANCE: keeps appointments   PHYSICAL EXAM:  BP (P) 101/67 (BP Location: Left Arm, Patient Position: Sitting)   Pulse (P) 71   Temp (!) (P) 96.9 F (36.1 C) (Tympanic)   Resp (P) 18   Wt (P) 223 lb 3.5 oz (101.2 kg)   BMI (P) 31.13 kg/m  Well-developed well-nourished patient in NAD. HEENT reveals PERLA, EOMI, discs not visualized.  Oral cavity is clear. No oral mucosal lesions are identified. Neck is clear without evidence of cervical or supraclavicular adenopathy. Lungs are clear to A&P. Cardiac examination is essentially unremarkable with regular rate and rhythm without murmur rub or thrill. Abdomen is benign with no organomegaly or masses noted. Motor sensory and DTR levels are equal and symmetric in the upper and lower extremities. Cranial nerves II through XII are grossly intact. Proprioception is intact. No peripheral adenopathy or edema is identified. No motor or sensory levels are noted. Crude visual fields are within normal range.  RADIOLOGY RESULTS: No current films for review  PLAN: Present time patient is doing well 1 month out from prostate radiation therapy.  I am pleased with his overall progress I  suggested he takes his Flomax more regularly a half an hour after dinner each night.  I have also explained her PSA protocol of asked to see him back in 3 months with a PSA prior to that visit.  Patient knows to call with any concerns at any time.  I would like to take this opportunity to thank you for allowing me to participate in the care of your patient.Noreene Filbert, MD

## 2018-10-03 ENCOUNTER — Other Ambulatory Visit: Payer: Self-pay

## 2018-10-03 ENCOUNTER — Encounter: Payer: Self-pay | Admitting: Primary Care

## 2018-10-03 ENCOUNTER — Ambulatory Visit (INDEPENDENT_AMBULATORY_CARE_PROVIDER_SITE_OTHER): Payer: Medicare Other | Admitting: Primary Care

## 2018-10-03 VITALS — BP 114/70 | HR 82 | Temp 97.8°F | Ht 71.0 in | Wt 225.8 lb

## 2018-10-03 DIAGNOSIS — I4819 Other persistent atrial fibrillation: Secondary | ICD-10-CM

## 2018-10-03 DIAGNOSIS — C61 Malignant neoplasm of prostate: Secondary | ICD-10-CM

## 2018-10-03 DIAGNOSIS — Z7901 Long term (current) use of anticoagulants: Secondary | ICD-10-CM

## 2018-10-03 DIAGNOSIS — Z23 Encounter for immunization: Secondary | ICD-10-CM

## 2018-10-03 DIAGNOSIS — J439 Emphysema, unspecified: Secondary | ICD-10-CM

## 2018-10-03 DIAGNOSIS — E785 Hyperlipidemia, unspecified: Secondary | ICD-10-CM

## 2018-10-03 DIAGNOSIS — E119 Type 2 diabetes mellitus without complications: Secondary | ICD-10-CM | POA: Diagnosis not present

## 2018-10-03 DIAGNOSIS — Z1211 Encounter for screening for malignant neoplasm of colon: Secondary | ICD-10-CM

## 2018-10-03 DIAGNOSIS — I25119 Atherosclerotic heart disease of native coronary artery with unspecified angina pectoris: Secondary | ICD-10-CM

## 2018-10-03 DIAGNOSIS — Z0000 Encounter for general adult medical examination without abnormal findings: Secondary | ICD-10-CM | POA: Diagnosis not present

## 2018-10-03 DIAGNOSIS — N182 Chronic kidney disease, stage 2 (mild): Secondary | ICD-10-CM

## 2018-10-03 DIAGNOSIS — F339 Major depressive disorder, recurrent, unspecified: Secondary | ICD-10-CM

## 2018-10-03 DIAGNOSIS — K219 Gastro-esophageal reflux disease without esophagitis: Secondary | ICD-10-CM

## 2018-10-03 DIAGNOSIS — I10 Essential (primary) hypertension: Secondary | ICD-10-CM

## 2018-10-03 LAB — POCT GLYCOSYLATED HEMOGLOBIN (HGB A1C): Hemoglobin A1C: 6.6 % — AB (ref 4.0–5.6)

## 2018-10-03 MED ORDER — ZOSTER VAC RECOMB ADJUVANTED 50 MCG/0.5ML IM SUSR: 0.5000 mL | Freq: Once | INTRAMUSCULAR | 1 refills | Status: AC

## 2018-10-03 NOTE — Assessment & Plan Note (Signed)
LDL stable on labs from February 2020. Continue statin therapy.

## 2018-10-03 NOTE — Assessment & Plan Note (Signed)
Doing well on bupropion 150 mg BID and Lexapro. Continue same.

## 2018-10-03 NOTE — Assessment & Plan Note (Signed)
Stable in the office today, continue current regimen. 

## 2018-10-03 NOTE — Assessment & Plan Note (Signed)
Compliant to INR checks and follow up. Continue current regimen.

## 2018-10-03 NOTE — Assessment & Plan Note (Signed)
Following with Urology. 

## 2018-10-03 NOTE — Assessment & Plan Note (Signed)
Stable, denies concerns.

## 2018-10-03 NOTE — Assessment & Plan Note (Signed)
Doing well on pantoprazole, continue same. 

## 2018-10-03 NOTE — Assessment & Plan Note (Signed)
Tetanus and pneumonia vaccinations UTD. Rx for shingrix provided. PSA UTD, following with Urology. Colonoscopy overdue, referral placed. Encouraged a healthy diet with regular exercise. Exam unremarkable. Labs reviewed. Follow up in 1 year for CPE.

## 2018-10-03 NOTE — Patient Instructions (Signed)
Stop by the lab prior to leaving today. I will notify you of your results once received.   Schedule your eye exam as discussed.  You will be contacted regarding your referral to GI for the colonoscopy.  Please let us know if you have not been contacted within one week.   It is important that you improve your diet. Please limit carbohydrates in the form of white bread, rice, pasta, sweets, fast food, fried food, sugary drinks, etc. Increase your consumption of fresh fruits and vegetables, whole grains, lean protein.  Ensure you are consuming 64 ounces of water daily.  Start exercising. You should be getting 150 minutes of exercise weekly.  Please schedule a follow up appointment in 6 months for diabetes check.   It was a pleasure to see you today!   Preventive Care 110 Years and Older, Male Preventive care refers to lifestyle choices and visits with your health care provider that can promote health and wellness. What does preventive care include?   A yearly physical exam. This is also called an annual well check.  Dental exams once or twice a year.  Routine eye exams. Ask your health care provider how often you should have your eyes checked.  Personal lifestyle choices, including: ? Daily care of your teeth and gums. ? Regular physical activity. ? Eating a healthy diet. ? Avoiding tobacco and drug use. ? Limiting alcohol use. ? Practicing safe sex. ? Taking low doses of aspirin every day. ? Taking vitamin and mineral supplements as recommended by your health care provider. What happens during an annual well check? The services and screenings done by your health care provider during your annual well check will depend on your age, overall health, lifestyle risk factors, and family history of disease. Counseling Your health care provider may ask you questions about your:  Alcohol use.  Tobacco use.  Drug use.  Emotional well-being.  Home and relationship well-being.   Sexual activity.  Eating habits.  History of falls.  Memory and ability to understand (cognition).  Work and work Statistician. Screening You may have the following tests or measurements:  Height, weight, and BMI.  Blood pressure.  Lipid and cholesterol levels. These may be checked every 5 years, or more frequently if you are over 36 years old.  Skin check.  Lung cancer screening. You may have this screening every year starting at age 11 if you have a 30-pack-year history of smoking and currently smoke or have quit within the past 15 years.  Colorectal cancer screening. All adults should have this screening starting at age 64 and continuing until age 71. You will have tests every 1-10 years, depending on your results and the type of screening test. People at increased risk should start screening at an earlier age. Screening tests may include: ? Guaiac-based fecal occult blood testing. ? Fecal immunochemical test (FIT). ? Stool DNA test. ? Virtual colonoscopy. ? Sigmoidoscopy. During this test, a flexible tube with a tiny camera (sigmoidoscope) is used to examine your rectum and lower colon. The sigmoidoscope is inserted through your anus into your rectum and lower colon. ? Colonoscopy. During this test, a long, thin, flexible tube with a tiny camera (colonoscope) is used to examine your entire colon and rectum.  Prostate cancer screening. Recommendations will vary depending on your family history and other risks.  Hepatitis C blood test.  Hepatitis B blood test.  Sexually transmitted disease (STD) testing.  Diabetes screening. This is done by checking your blood  sugar (glucose) after you have not eaten for a while (fasting). You may have this done every 1-3 years.  Abdominal aortic aneurysm (AAA) screening. You may need this if you are a current or former smoker.  Osteoporosis. You may be screened starting at age 41 if you are at high risk. Talk with your health care  provider about your test results, treatment options, and if necessary, the need for more tests. Vaccines Your health care provider may recommend certain vaccines, such as:  Influenza vaccine. This is recommended every year.  Tetanus, diphtheria, and acellular pertussis (Tdap, Td) vaccine. You may need a Td booster every 10 years.  Varicella vaccine. You may need this if you have not been vaccinated.  Zoster vaccine. You may need this after age 24.  Measles, mumps, and rubella (MMR) vaccine. You may need at least one dose of MMR if you were born in 1957 or later. You may also need a second dose.  Pneumococcal 13-valent conjugate (PCV13) vaccine. One dose is recommended after age 73.  Pneumococcal polysaccharide (PPSV23) vaccine. One dose is recommended after age 42.  Meningococcal vaccine. You may need this if you have certain conditions.  Hepatitis A vaccine. You may need this if you have certain conditions or if you travel or work in places where you may be exposed to hepatitis A.  Hepatitis B vaccine. You may need this if you have certain conditions or if you travel or work in places where you may be exposed to hepatitis B.  Haemophilus influenzae type b (Hib) vaccine. You may need this if you have certain risk factors. Talk to your health care provider about which screenings and vaccines you need and how often you need them. This information is not intended to replace advice given to you by your health care provider. Make sure you discuss any questions you have with your health care provider. Document Released: 04/30/2015 Document Revised: 05/24/2017 Document Reviewed: 02/02/2015 Elsevier Interactive Patient Education  2019 [employer].

## 2018-10-03 NOTE — Assessment & Plan Note (Signed)
Rate and rhythm regular today. Continue warfarin and metoprolol succinate.

## 2018-10-03 NOTE — Assessment & Plan Note (Signed)
BMP from March 2020 appears normal. Continue to monitor.

## 2018-10-03 NOTE — Assessment & Plan Note (Signed)
LDL from February 2020 at goal. Continue statin, clopidogrel, BP control.

## 2018-10-03 NOTE — Assessment & Plan Note (Addendum)
A1C today at 6.6 which is stable. Discussed to schedule eye exam. Foot exam UTD. Pneumonia vaccination UTD. Managed on ACE and statin. Doing well on gabapentin for neuropathy.   Follow up in 6 months. Continue current regimen.

## 2018-10-03 NOTE — Progress Notes (Signed)
Subjective:    Patient ID: BURK HOCTOR, male    DOB: 1951-02-06, 68 y.o.   MRN: [id number]  HPI  Mr. Golebiewski is a 68 year old male who presents today for complete physical and follow up of chronic conditions.   Immunizations: -Tetanus: Completed in 2012 -Influenza: Due this season  -Pneumonia: Completed in 2019 -Shingles: Never completed  Diet: He endorses a healthy diet with vegetables, chicken, fast food, occasionally sweets. He is drinking mostly drinking diet soda, coffee. No water.  Exercise: He is not exercising, active at home.  Eye exam: Due, he will schedule Dental exam: No recent exam Colonoscopy: Never completed. Did not complete last year.  PSA: Following with Urology, next visit due in August Hep C Screen: Negative  BP Readings from Last 3 Encounters:  10/03/18 114/70  09/27/18 (P) 101/67  06/29/18 102/74     Review of Systems  Constitutional: Negative for unexpected weight change.  HENT: Negative for rhinorrhea.   Respiratory: Negative for cough and shortness of breath.   Cardiovascular: Negative for chest pain.  Gastrointestinal: Negative for constipation and diarrhea.  Genitourinary: Negative for difficulty urinating.  Musculoskeletal: Positive for arthralgias.       Chronic knee pain  Skin: Negative for rash.  Allergic/Immunologic: Negative for environmental allergies.  Neurological: Negative for dizziness, numbness and headaches.  Psychiatric/Behavioral: Negative for suicidal ideas.       Past Medical History:  Diagnosis Date  . Arthritis    "knees and lower back" (03/14/2013)  . Atrial flutter ([place])    radiofrequency ablation in 2001  . CAD (coronary artery disease)    a. Nonobstructive. Cardiac cath in 2001-50% mid RI, normal LM, LAD, RCA b. cath 10/16/2014 95% mid RCA treated with DES, 99% ost D1 medical management due to small aneurysmal segment  . Chronic anticoagulation    chronic Coumadin anticoagulation  . Chronic obstructive pulmonary  disease ([place]) 04/20/2011  . Diabetes mellitus, type 2 ([place])   . GERD (gastroesophageal reflux disease)   . Hyperlipidemia   . Hypertension    with hypertensive heart disease  . Obesity   . Persistent atrial fibrillation    recurrent atrial flutter since 2001 s/p DCCVs, multiple failed AADs, h/o tachy-mediated cardiomyopathy  . Shortness of breath    "can come on at any time" (03/14/2013)  . Sleep apnea    "dx'd; couldn't wear the mask" (03/14/2013)  . Tobacco abuse      Social History   Socioeconomic History  . Marital status: Widowed    Spouse name: Not on file  . Number of children: 1  . Years of education: Not on file  . Highest education level: Not on file  Occupational History  . Occupation: Merchandiser, retail: UNEMPLOYED  Social Needs  . Financial resource strain: Not on file  . Food insecurity    Worry: Not on file    Inability: Not on file  . Transportation needs    Medical: Not on file    Non-medical: Not on file  Tobacco Use  . Smoking status: Former Smoker    Packs/day: 1.00    Years: 42.00    Pack years: 42.00    Types: Cigarettes    Quit date: 12/31/2011    Years since quitting: 6.7  . Smokeless tobacco: Never Used  Substance and Sexual Activity  . Alcohol use: Not Currently    Comment: 03/14/2013 "stopped drinking back in 2002; never had problem w/it"  . Drug use: No  .  Sexual activity: Not Currently  Lifestyle  . Physical activity    Days per week: Not on file    Minutes per session: Not on file  . Stress: Not on file  Relationships  . Social Herbalist on phone: Not on file    Gets together: Not on file    Attends religious service: Not on file    Active member of club or organization: Not on file    Attends meetings of clubs or organizations: Not on file    Relationship status: Not on file  . Intimate partner violence    Fear of current or ex partner: Not on file    Emotionally abused: Not on file    Physically abused: Not  on file    Forced sexual activity: Not on file  Other Topics Concern  . Not on file  Social History Narrative   Single.   Retired.    1 son, deceased.    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   Enjoys playing on the computer.        Past Surgical History:  Procedure Laterality Date  . ATRIAL FLUTTER ABLATION  2002   atrial flutter; subsequently developed atrial fibrillation  . AV NODE ABLATION  01/24/2013  . CARDIAC CATHETERIZATION  2002  . CARDIAC CATHETERIZATION N/A 10/16/2014   Procedure: Left Heart Cath and Coronary Angiography;  Surgeon: [name], MD;  Location: [facility];  Service: Cardiovascular;  Laterality: N/A;  . CARDIOVERSION  05/31/2011   Procedure: CARDIOVERSION;  Surgeon: Cristopher Estimable. Lattie Haw, MD;  Location: [facility];  Service: Cardiovascular;  Laterality: N/A;  . CARPAL TUNNEL RELEASE Left 1980's  . INSERT / REPLACE / REMOVE PACEMAKER  01/24/2013    Medtronic Adapta L dual-chamber pacemaker, serial number [id number]   . LEFT HEART CATH AND CORONARY ANGIOGRAPHY N/A 06/07/2018   Procedure: LEFT HEART CATH AND CORONARY ANGIOGRAPHY;  Surgeon: [name], MD;  Location: [place] [facility];  Service: Cardiovascular;  Laterality: N/A;  . LEFT HEART CATHETERIZATION WITH CORONARY ANGIOGRAM N/A 03/17/2013   Procedure: LEFT HEART CATHETERIZATION WITH CORONARY ANGIOGRAM;  Surgeon: Burnell Blanks, MD; LAD mild dz, D1 branch 100%, inferior branch 99%, CFX OK, RCA 50%, EF 65%    . LOOP RECORDER IMPLANT  2002  . PERMANENT PACEMAKER INSERTION N/A 01/24/2013   Procedure: PERMANENT PACEMAKER INSERTION;  Surgeon: [name], MD;  Location: [facility];  Service: Cardiovascular;  Laterality: N/A;  . TIBIAL TUBERCLERPLASTY  ~ 2003    Family History  Problem Relation Age of Onset  . Alzheimer's disease Mother   . Osteoporosis Mother     No Known Allergies  Current Outpatient Medications on File Prior to Visit   Medication Sig Dispense Refill  . buPROPion (ZYBAN) 150 MG 12 hr tablet TAKE 1 TABLET BY MOUTH TWICE A DAY 180 tablet 1  . clopidogrel (PLAVIX) 75 MG tablet TAKE 1 TABLET BY MOUTH EVERY DAY 90 tablet 3  . escitalopram (LEXAPRO) 20 MG tablet TAKE 1 TABLET (20 MG TOTAL) BY MOUTH DAILY. FOR DEPRESSION. 90 tablet 0  . fish oil-omega-3 fatty acids 1000 MG capsule Take 1 g by mouth daily.      . furosemide (LASIX) 20 MG tablet TAKE 1 TABLET BY MOUTH EVERY DAY 90 tablet 2  . gabapentin (NEURONTIN) 300 MG capsule TAKE 1 CAPSULE BY MOUTH THREE TIMES DAILY FOR DIABETIC NEUROPATHY. 270 capsule 1  . glucose blood (  ONE TOUCH ULTRA TEST) test strip USE TO TEST UP TO 4 TIMES DAILY 100 each 2  . isosorbide mononitrate (IMDUR) 30 MG 24 hr tablet TAKE 1 TABLET BY MOUTH EVERY DAY 90 tablet 0  . JARDIANCE 10 MG TABS tablet TAKE 1 TABLET BY MOUTH EVERY MORNING FOR DIABETES. 90 tablet 1  . lisinopril (ZESTRIL) 5 MG tablet TAKE 1 TABLET (5 MG TOTAL) BY MOUTH DAILY. FOR BLOOD PRESSURE AND KIDNEY PROTECTION. 90 tablet 1  . metFORMIN (GLUCOPHAGE) 500 MG tablet TAKE 1 TABLET BY MOUTH TWICE DAILY WITH A MEAL 180 tablet 1  . metoprolol succinate (TOPROL XL) 25 MG 24 hr tablet Take 1 tablet (25 mg total) by mouth daily. 90 tablet 3  . nitroGLYCERIN (NITROSTAT) 0.4 MG SL tablet Place 1 tablet (0.4 mg total) under the tongue every 5 (five) minutes as needed for chest pain. 25 tablet 0  . pantoprazole (PROTONIX) 40 MG tablet Take 1 tablet (40 mg total) by mouth daily. For heartburn. 90 tablet 1  . rosuvastatin (CRESTOR) 20 MG tablet Take 1 tablet by mouth every evening for cholesterol. 90 tablet 2  . sitaGLIPtin (JANUVIA) 100 MG tablet Take 1 tablet by mouth once daily for diabetes. 90 tablet 2  . tamsulosin (FLOMAX) 0.4 MG CAPS capsule Take 1 capsule (0.4 mg total) by mouth 2 (two) times daily. 60 capsule 6  . warfarin (COUMADIN) 1 MG tablet TAKE 1 TABLET ON MONDAYS. PT. USES 1 MG TABLETS IN ADDITION TO 5 MG TABLETS. 90 tablet 0   . warfarin (COUMADIN) 5 MG tablet TAKE 1 TABLET BY MOUTH ON MONDAYS AND 1/2 TABLET ALL OTHER DAYS 48 tablet 7   No current facility-administered medications on file prior to visit.     BP 114/70   Pulse 82   Temp 97.8 F (36.6 C) (Tympanic)   Ht 5\' 11"  (1.803 m)   Wt 225 lb 12 oz (102.4 kg)   SpO2 98%   BMI 31.49 kg/m    Objective:   Physical Exam  Constitutional: He is oriented to person, place, and time. He appears well-nourished.  HENT:  Mouth/Throat: No oropharyngeal exudate.  Eyes: Pupils are equal, round, and reactive to light. EOM are normal.  Neck: Neck supple. No thyromegaly present.  Cardiovascular: Normal rate and regular rhythm.  Respiratory: Effort normal and breath sounds normal.  GI: Soft. Bowel sounds are normal. There is no abdominal tenderness.  Musculoskeletal: Normal range of motion.  Neurological: He is alert and oriented to person, place, and time.  Skin: Skin is warm and dry.  Psychiatric: He has a normal mood and affect.           Assessment & Plan:

## 2018-10-10 ENCOUNTER — Other Ambulatory Visit: Payer: Medicare Other

## 2018-10-11 ENCOUNTER — Other Ambulatory Visit: Payer: Self-pay | Admitting: Primary Care

## 2018-10-11 DIAGNOSIS — F339 Major depressive disorder, recurrent, unspecified: Secondary | ICD-10-CM

## 2018-10-16 ENCOUNTER — Other Ambulatory Visit: Payer: Self-pay | Admitting: Primary Care

## 2018-10-16 DIAGNOSIS — E119 Type 2 diabetes mellitus without complications: Secondary | ICD-10-CM

## 2018-10-17 ENCOUNTER — Other Ambulatory Visit: Payer: Self-pay

## 2018-10-17 ENCOUNTER — Encounter: Payer: Self-pay | Admitting: *Deleted

## 2018-10-17 ENCOUNTER — Telehealth: Payer: Self-pay | Admitting: Gastroenterology

## 2018-10-17 DIAGNOSIS — Z1211 Encounter for screening for malignant neoplasm of colon: Secondary | ICD-10-CM

## 2018-10-17 NOTE — Telephone Encounter (Signed)
Gastroenterology Pre-Procedure Review  Request Date: [name] Cook 11/12/2018   [facility] Requesting Physician: Dr. Vicente Cook  PATIENT REVIEW QUESTIONS: The patient responded to the following health history questions as indicated:    1. Are you having any GI issues? no 2. Do you have a personal history of Polyps? no 3. Do you have a family history of Colon Cancer or Polyps? no 4. Diabetes Mellitus? yes (type II) 5. Joint replacements in the past 12 months?no 6. Major health problems in the past 3 months?no 7. Any artificial heart valves, MVP, or defibrillator?yes Passenger transport manager 5 yrs ago, Dr. Lovena Cook Cardiologist [name] Cook)    [place]:    Patient reports the following regarding taking any anticoagulation/antiplatelet therapy:   Plavix, Coumadin, Eliquis, Xarelto, Lovenox, Pradaxa, Brilinta, or Effient? yes (Plavix and Warfarin) Aspirin? no  Patient confirms/reports the following medications:  Current Outpatient Medications  Medication Sig Dispense Refill  . buPROPion (ZYBAN) 150 MG 12 hr tablet TAKE 1 TABLET BY MOUTH TWICE A DAY 180 tablet 1  . clopidogrel (PLAVIX) 75 MG tablet TAKE 1 TABLET BY MOUTH EVERY DAY 90 tablet 3  . escitalopram (LEXAPRO) 20 MG tablet TAKE 1 TABLET (20 MG TOTAL) BY MOUTH DAILY. FOR DEPRESSION. 90 tablet 0  . fish oil-omega-3 fatty acids 1000 MG capsule Take 1 g by mouth daily.      . furosemide (LASIX) 20 MG tablet TAKE 1 TABLET BY MOUTH EVERY DAY 90 tablet 2  . gabapentin (NEURONTIN) 300 MG capsule TAKE 1 CAPSULE BY MOUTH THREE TIMES DAILY FOR DIABETIC NEUROPATHY. 270 capsule 1  . glucose blood (ONE TOUCH ULTRA TEST) test strip USE TO TEST UP TO 4 TIMES DAILY 100 each 2  . isosorbide mononitrate (IMDUR) 30 MG 24 hr tablet TAKE 1 TABLET BY MOUTH EVERY DAY 90 tablet 0  . JARDIANCE 10 MG TABS tablet TAKE 1 TABLET BY MOUTH EVERY MORNING FOR DIABETES. 90 tablet 1  . lisinopril (ZESTRIL) 5 MG tablet TAKE 1 TABLET (5 MG TOTAL) BY MOUTH DAILY. FOR BLOOD PRESSURE AND  KIDNEY PROTECTION. 90 tablet 1  . metFORMIN (GLUCOPHAGE) 500 MG tablet TAKE 1 TABLET BY MOUTH TWICE DAILY WITH A MEAL 180 tablet 1  . metoprolol succinate (TOPROL XL) 25 MG 24 hr tablet Take 1 tablet (25 mg total) by mouth daily. 90 tablet 3  . nitroGLYCERIN (NITROSTAT) 0.4 MG SL tablet Place 1 tablet (0.4 mg total) under the tongue every 5 (five) minutes as needed for chest pain. 25 tablet 0  . pantoprazole (PROTONIX) 40 MG tablet Take 1 tablet (40 mg total) by mouth daily. For heartburn. 90 tablet 1  . rosuvastatin (CRESTOR) 20 MG tablet Take 1 tablet by mouth every evening for cholesterol. 90 tablet 2  . sitaGLIPtin (JANUVIA) 100 MG tablet TAKE 1 TABLET BY MOUTH ONCE DAILY FOR DIABETES. 90 tablet 1  . tamsulosin (FLOMAX) 0.4 MG CAPS capsule Take 1 capsule (0.4 mg total) by mouth 2 (two) times daily. 60 capsule 6  . warfarin (COUMADIN) 1 MG tablet TAKE 1 TABLET ON MONDAYS. PT. USES 1 MG TABLETS IN ADDITION TO 5 MG TABLETS. 90 tablet 0  . warfarin (COUMADIN) 5 MG tablet TAKE 1 TABLET BY MOUTH ON MONDAYS AND 1/2 TABLET ALL OTHER DAYS 48 tablet 7   No current facility-administered medications for this visit.     Patient confirms/reports the following allergies:  No Known Allergies  No orders of the defined types were placed in this encounter.   AUTHORIZATION INFORMATION Primary Insurance: 1D#:  Group #:  Secondary Insurance: 1D#: Group #:  SCHEDULE INFORMATION: Date:  Time: Location:

## 2018-10-20 ENCOUNTER — Other Ambulatory Visit: Payer: Self-pay | Admitting: Primary Care

## 2018-10-28 ENCOUNTER — Other Ambulatory Visit: Payer: Self-pay | Admitting: Primary Care

## 2018-10-28 DIAGNOSIS — E785 Hyperlipidemia, unspecified: Secondary | ICD-10-CM

## 2018-11-05 ENCOUNTER — Encounter: Payer: Self-pay | Admitting: *Deleted

## 2018-11-05 ENCOUNTER — Telehealth: Payer: Self-pay | Admitting: Gastroenterology

## 2018-11-05 NOTE — Telephone Encounter (Signed)
Patient called & would like to r/s his colonoscopy from  11-12-18 to September.

## 2018-11-06 ENCOUNTER — Ambulatory Visit (INDEPENDENT_AMBULATORY_CARE_PROVIDER_SITE_OTHER): Payer: Medicare Other | Admitting: *Deleted

## 2018-11-06 ENCOUNTER — Telehealth: Payer: Self-pay

## 2018-11-06 DIAGNOSIS — I4819 Other persistent atrial fibrillation: Secondary | ICD-10-CM

## 2018-11-06 NOTE — Telephone Encounter (Signed)
Returned patients call.  Colonoscopy has been rescheduled to 12/30/18.  Physician remains Dr. Vicente Males at [facility].  New instructions sent.  Referral updated. COVID instruction sheet placed in mail.  Thanks [employer]

## 2018-11-07 ENCOUNTER — Telehealth: Payer: Self-pay

## 2018-11-07 LAB — CUP PACEART REMOTE DEVICE CHECK
Battery Impedance: 324 Ohm
Battery Remaining Longevity: 108 mo
Battery Voltage: 2.79 V
Brady Statistic RV Percent Paced: 100 %
Date Time Interrogation Session: 20200723162110
Implantable Lead Implant Date: 20141010
Implantable Lead Implant Date: 20141010
Implantable Lead Location: 753859
Implantable Lead Location: 753860
Implantable Lead Model: 5076
Implantable Lead Model: 5076
Implantable Pulse Generator Implant Date: 20141010
Lead Channel Impedance Value: 589 Ohm
Lead Channel Pacing Threshold Amplitude: 0.75 V
Lead Channel Pacing Threshold Pulse Width: 0.4 ms
Lead Channel Setting Pacing Amplitude: 2.5 V
Lead Channel Setting Pacing Pulse Width: 0.4 ms
Lead Channel Setting Sensing Sensitivity: 4 mV

## 2018-11-07 NOTE — Telephone Encounter (Signed)
Left message for patient to remind of missed remote transmission.  

## 2018-11-08 ENCOUNTER — Other Ambulatory Visit: Payer: Self-pay | Admitting: Primary Care

## 2018-11-08 DIAGNOSIS — E119 Type 2 diabetes mellitus without complications: Secondary | ICD-10-CM

## 2018-11-12 ENCOUNTER — Other Ambulatory Visit: Payer: Self-pay | Admitting: Primary Care

## 2018-11-12 DIAGNOSIS — F339 Major depressive disorder, recurrent, unspecified: Secondary | ICD-10-CM

## 2018-11-18 ENCOUNTER — Other Ambulatory Visit: Payer: Self-pay | Admitting: Primary Care

## 2018-11-20 ENCOUNTER — Ambulatory Visit (INDEPENDENT_AMBULATORY_CARE_PROVIDER_SITE_OTHER): Payer: Medicare Other | Admitting: Urology

## 2018-11-20 ENCOUNTER — Other Ambulatory Visit: Payer: Self-pay

## 2018-11-20 ENCOUNTER — Encounter: Payer: Self-pay | Admitting: Urology

## 2018-11-20 VITALS — BP 120/80 | HR 82 | Ht 71.0 in | Wt 230.0 lb

## 2018-11-20 DIAGNOSIS — C61 Malignant neoplasm of prostate: Secondary | ICD-10-CM

## 2018-11-20 NOTE — Progress Notes (Signed)
11/20/2018 1:00 PM   TLALOC Cook 11-05-1950 [id number]  Referring provider: [name], NP [name] Cook,  [name] Cook [id number]  Chief Complaint  Patient presents with  . Prostate Cancer    Follow up    HPI: [name] Cook is a 68 y.o. male with personal history of prostate cancer who presents today for routine follow-up.  He underwent prostate biopsy in 04/2018.  This revealed Prostate biopsy revealed 6/12 cores, all on the right including 3 cores of Gleason 3+3 (up to 99%) and 3 cores of 3+4 up to 96%.  High PSA 6.1.  Rectal exam was abnormal with a nodule at the right apex and firm on the side.  TRUS vol 50 with small median lobe, irregular on right.  Ultimately, he elected treatment with EBRT and short-term Lupron.  He received his dose in 06/2018.  He completed radiation in 08/2018.  This was relatively well-tolerated.  He reports some hot flashes and worsened erectile dysfunction since initiation of ADT.   He denies difficulty urinating and the sensation of incomplete bladder emptying.  He does have some urinary urgency which is also improving.  No dysuria or gross hematuria.    PMH: Past Medical History:  Diagnosis Date  . Arthritis    "knees and lower back" (03/14/2013)  . Atrial flutter ([place])    radiofrequency ablation in 2001  . CAD (coronary artery disease)    a. Nonobstructive. Cardiac cath in 2001-50% mid RI, normal LM, LAD, RCA b. cath 10/16/2014 95% mid RCA treated with DES, 99% ost D1 medical management due to small aneurysmal segment  . Chronic anticoagulation    chronic Coumadin anticoagulation  . Chronic obstructive pulmonary disease ([place]) 04/20/2011  . Diabetes mellitus, type 2 ([place])   . GERD (gastroesophageal reflux disease)   . Hyperlipidemia   . Hypertension    with hypertensive heart disease  . Obesity   . Persistent atrial fibrillation    recurrent atrial flutter since 2001 s/p DCCVs, multiple failed AADs, h/o tachy-mediated cardiomyopathy   . Shortness of breath    "can come on at any time" (03/14/2013)  . Sleep apnea    "dx'd; couldn't wear the mask" (03/14/2013)  . Tobacco abuse     Surgical History: Past Surgical History:  Procedure Laterality Date  . ATRIAL FLUTTER ABLATION  2002   atrial flutter; subsequently developed atrial fibrillation  . AV NODE ABLATION  01/24/2013  . CARDIAC CATHETERIZATION  2002  . CARDIAC CATHETERIZATION N/A 10/16/2014   Procedure: Left Heart Cath and Coronary Angiography;  Surgeon: [name], MD;  Location: [place] [facility];  Service: Cardiovascular;  Laterality: N/A;  . CARDIOVERSION  05/31/2011   Procedure: CARDIOVERSION;  Surgeon: Cristopher Estimable. Lattie Haw, MD;  Location: [facility];  Service: Cardiovascular;  Laterality: N/A;  . CARPAL TUNNEL RELEASE Left 1980's  . INSERT / REPLACE / REMOVE PACEMAKER  01/24/2013    Medtronic Adapta L dual-chamber pacemaker, serial number [id number]   . LEFT HEART CATH AND CORONARY ANGIOGRAPHY N/A 06/07/2018   Procedure: LEFT HEART CATH AND CORONARY ANGIOGRAPHY;  Surgeon: [name], MD;  Location: [place] [facility];  Service: Cardiovascular;  Laterality: N/A;  . LEFT HEART CATHETERIZATION WITH CORONARY ANGIOGRAM N/A 03/17/2013   Procedure: LEFT HEART CATHETERIZATION WITH CORONARY ANGIOGRAM;  Surgeon: Burnell Blanks, MD; LAD mild dz, D1 branch 100%, inferior branch 99%, CFX OK, RCA 50%, EF 65%    . LOOP RECORDER IMPLANT  2002  .  PERMANENT PACEMAKER INSERTION N/A 01/24/2013   Procedure: PERMANENT PACEMAKER INSERTION;  Surgeon: [name], MD;  Location[facility] C[facility];  Service: Cardiovascular;  Laterality: N/A;  . TIBIAL TUBERCLERPLASTY  ~ 2003    Home Medications:  Allergies as of 11/20/2018   No Known Allergies     Medication List       Accurate as of November 20, 2018  1:00 PM. If you have any questions, ask your nurse or doctor.        buPROPion 150 MG 12 hr tablet Commonly known as: ZYBAN TAKE 1 TABLET BY MOUTH TWICE A DAY    clopidogrel 75 MG tablet Commonly known as: PLAVIX TAKE 1 TABLET BY MOUTH EVERY DAY   escitalopram 20 MG tablet Commonly known as: LEXAPRO TAKE 1 TABLET (20 MG TOTAL) BY MOUTH DAILY. FOR DEPRESSION.   fish oil-omega-3 fatty acids 1000 MG capsule Take 1 g by mouth daily.   furosemide 20 MG tablet Commonly known as: LASIX TAKE 1 TABLET BY MOUTH EVERY DAY   gabapentin 300 MG capsule Commonly known as: NEURONTIN TAKE 1 CAPSULE BY MOUTH THREE TIMES DAILY FOR DIABETIC NEUROPATHY.   glucose blood test strip Commonly known as: ONE TOUCH ULTRA TEST USE TO TEST UP TO 4 TIMES DAILY   isosorbide mononitrate 30 MG 24 hr tablet Commonly known as: IMDUR TAKE 1 TABLET BY MOUTH EVERY DAY   Jardiance 10 MG Tabs tablet Generic drug: empagliflozin TAKE 1 TABLET BY MOUTH EVERY MORNING FOR DIABETES.   lisinopril 5 MG tablet Commonly known as: ZESTRIL TAKE 1 TABLET (5 MG TOTAL) BY MOUTH DAILY. FOR BLOOD PRESSURE AND KIDNEY PROTECTION.   metFORMIN 500 MG tablet Commonly known as: GLUCOPHAGE TAKE 1 TABLET BY MOUTH TWICE DAILY WITH A MEAL   metoprolol succinate 25 MG 24 hr tablet Commonly known as: Toprol XL Take 1 tablet (25 mg total) by mouth daily.   nitroGLYCERIN 0.4 MG SL tablet Commonly known as: NITROSTAT Place 1 tablet (0.4 mg total) under the tongue every 5 (five) minutes as needed for chest pain.   pantoprazole 40 MG tablet Commonly known as: PROTONIX Take 1 tablet (40 mg total) by mouth daily. For heartburn.   rosuvastatin 20 MG tablet Commonly known as: CRESTOR TAKE 1 TABLET BY MOUTH EVERY DAY IN THE EVENING FOR CHOLESTEROL   sitaGLIPtin 100 MG tablet Commonly known as: Januvia TAKE 1 TABLET BY MOUTH ONCE DAILY FOR DIABETES.   tamsulosin 0.4 MG Caps capsule Commonly known as: FLOMAX Take 1 capsule (0.4 mg total) by mouth 2 (two) times daily.   warfarin 1 MG tablet Commonly known as: COUMADIN Take as directed by the anticoagulation clinic. If you are unsure how to  take this medication, talk to your nurse or doctor. Original instructions: TAKE 1 TABLET ON MONDAYS. PT. USES 1 MG TABLETS IN ADDITION TO 5 MG TABLETS.   warfarin 5 MG tablet Commonly known as: COUMADIN Take as directed by the anticoagulation clinic. If you are unsure how to take this medication, talk to your nurse or doctor. Original instructions: TAKE 1 TABLET BY MOUTH ON MONDAYS AND 1/2 TABLET ALL OTHER DAYS       Allergies: No Known Allergies  Family History: Family History  Problem Relation Age of Onset  . Alzheimer's disease Mother   . Osteoporosis Mother     Social History:  reports that he quit smoking about 6 years ago. His smoking use included cigarettes. He has a 42.00 pack-year smoking history. He has never  used smokeless tobacco. He reports previous alcohol use. He reports that he does not use drugs.  ROS: UROLOGY Frequent Urination?: No Hard to postpone urination?: No Burning/pain with urination?: No Get up at night to urinate?: No Leakage of urine?: No Urine stream starts and stops?: No Trouble starting stream?: No Do you have to strain to urinate?: No Blood in urine?: No Urinary tract infection?: No Sexually transmitted disease?: No Injury to kidneys or bladder?: No Painful intercourse?: No Weak stream?: No Erection problems?: No Penile pain?: No  Gastrointestinal Nausea?: No Vomiting?: No Indigestion/heartburn?: No Diarrhea?: No Constipation?: No  Constitutional Fever: No Night sweats?: No Weight loss?: No Fatigue?: No  Skin Skin rash/lesions?: No Itching?: No  Eyes Blurred vision?: No Double vision?: No  Ears/Nose/Throat Sore throat?: No Sinus problems?: No  Hematologic/Lymphatic Swollen glands?: No Easy bruising?: No  Cardiovascular Leg swelling?: No Chest pain?: No  Respiratory Cough?: No Shortness of breath?: No  Endocrine Excessive thirst?: No  Musculoskeletal Back pain?: No Joint pain?: No  Neurological  Headaches?: No Dizziness?: No  Psychologic Depression?: No Anxiety?: No  Physical Exam: BP 120/80   Pulse 82   Ht 5\' 11"  (1.803 m)   Wt 230 lb (104.3 kg)   BMI 32.08 kg/m   Constitutional:  Alert and oriented, No acute distress. HEENT: [place] AT, moist mucus membranes.  Trachea midline, no masses. Cardiovascular: No clubbing, cyanosis, or edema. Respiratory: Normal respiratory effort, no increased work of breathing. Skin: No rashes, bruises or suspicious lesions. Neurologic: Grossly intact, no focal deficits, moving all 4 extremities. Psychiatric: Normal mood and affect.  Laboratory Data: Lab Results  Component Value Date   WBC 6.1 08/14/2018   HGB 14.3 08/14/2018   HCT 43.0 08/14/2018   MCV 97.3 08/14/2018   PLT 136 (L) 08/14/2018    Lab Results  Component Value Date   CREATININE 1.16 06/27/2018    Lab Results  Component Value Date   PSA 6.10 (H) 09/21/2017   PSA 4.33 (H) 10/27/2015   PSA 4.71 (H) 06/22/2014     Lab Results  Component Value Date   HGBA1C 6.6 (A) 10/03/2018    Assessment & Plan:    1. Prostate cancer [facility]) Status post IMRT completed 08/2018 and ADT x6 months Likely still under Lupron effect but should be waning, anticipate provement in hot flashes Minimal voiding issues, improving We will go ahead and draw his PSA today as he has an upcoming appointment with Dr. Donella Stade We will see him every 6 months with PSA, he is agreeable this plan - PSA  Return in about 6 months (around 05/23/2019) for PSA.  Hollice Espy, MD  [facility] [address], [place], [place] [address] [phone]  I have seen and examined the patient, labs/ imaging reviewed and discussed  management with Thomas Hoff  I reviewed the PA's note and agree with the documented findings and plan of care.

## 2018-11-20 NOTE — Telephone Encounter (Signed)
Rx have not been prescribed. Last appointment on 10/03/2018 CPE . Next future appointment on 03/31/2019

## 2018-11-20 NOTE — Progress Notes (Signed)
Remote pacemaker transmission.   

## 2018-11-20 NOTE — Telephone Encounter (Signed)
Please send to cardiology

## 2018-11-21 ENCOUNTER — Telehealth: Payer: Self-pay

## 2018-11-21 LAB — PSA: Prostate Specific Ag, Serum: <0.1 ng/mL (ref 0.0–4.0)

## 2018-11-21 NOTE — Telephone Encounter (Signed)
Mychart sent.

## 2018-11-21 NOTE — Telephone Encounter (Signed)
-----   Message from Hollice Espy, MD sent at 11/21/2018  9:38 AM EDT ----- Your PSA is undetectable.  Great news.  Hollice Espy, MD

## 2018-12-19 ENCOUNTER — Other Ambulatory Visit: Payer: Self-pay

## 2018-12-20 ENCOUNTER — Other Ambulatory Visit: Payer: Self-pay

## 2018-12-20 ENCOUNTER — Inpatient Hospital Stay: Payer: Medicare Other | Attending: Radiation Oncology

## 2018-12-20 DIAGNOSIS — C61 Malignant neoplasm of prostate: Secondary | ICD-10-CM

## 2018-12-20 LAB — PSA: Prostatic Specific Antigen: <0.01 ng/mL (ref 0.00–4.00)

## 2018-12-26 ENCOUNTER — Encounter: Payer: Self-pay | Admitting: Family Medicine

## 2018-12-26 ENCOUNTER — Other Ambulatory Visit
Admission: RE | Admit: 2018-12-26 | Discharge: 2018-12-26 | Disposition: A | Discharge status: Home / Self Care | Payer: Medicare Other | Source: Ambulatory Visit | Attending: Gastroenterology | Admitting: Gastroenterology

## 2018-12-26 ENCOUNTER — Other Ambulatory Visit: Payer: Self-pay

## 2018-12-26 ENCOUNTER — Ambulatory Visit (INDEPENDENT_AMBULATORY_CARE_PROVIDER_SITE_OTHER): Payer: Medicare Other | Admitting: Family Medicine

## 2018-12-26 VITALS — BP 100/60 | HR 82 | Temp 98.3°F | Ht 71.0 in | Wt 231.5 lb

## 2018-12-26 DIAGNOSIS — Z01812 Encounter for preprocedural laboratory examination: Secondary | ICD-10-CM | POA: Diagnosis present

## 2018-12-26 DIAGNOSIS — F339 Major depressive disorder, recurrent, unspecified: Secondary | ICD-10-CM

## 2018-12-26 DIAGNOSIS — M1711 Unilateral primary osteoarthritis, right knee: Secondary | ICD-10-CM

## 2018-12-26 DIAGNOSIS — Z20828 Contact with and (suspected) exposure to other viral communicable diseases: Secondary | ICD-10-CM | POA: Insufficient documentation

## 2018-12-26 LAB — SARS CORONAVIRUS 2 (TAT 6-24 HRS): SARS Coronavirus 2: NEGATIVE

## 2018-12-26 MED ORDER — METHYLPREDNISOLONE ACETATE 40 MG/ML IJ SUSP
80.0000 mg | Freq: Once | INTRAMUSCULAR | Status: AC
  Administered 2018-12-26: 80 mg via INTRA_ARTICULAR

## 2018-12-26 MED ORDER — ESCITALOPRAM OXALATE 20 MG PO TABS: 20.0000 mg | ORAL_TABLET | Freq: Every day | ORAL | 1 refills | Status: DC

## 2018-12-26 NOTE — Progress Notes (Signed)
[name] Cook T. Janilah Hojnacki, MD Primary Care and Sports Medicine [facility] [place], [address] Phone: [phone]  FAX: [phone]  ARDELL HEGGE - 68 y.o. male  MRN [id number]  Date of Birth: Sep 24, 1950  Visit Date: 12/26/2018  PCP: [name], NP  Referred by: [name], NP  Chief Complaint  Patient presents with  . Knee Pain    Bilateral   Subjective:   [name] Cook is a 68 y.o. very pleasant male patient who presents with the following:  Both knees for a couple of weeks.  He is a nice elderly gentleman, and he presents with bilateral chronic knee pain, and the right is worse than the left right now.  He is anticoagulated on Coumadin as well as Plavix.  He also has diabetes mellitus.  Patient also has a pacemaker and also has a remote history of myocardial infarction as well as A. fib.  2-3 years.   Lab Results  Component Value Date   HGBA1C 6.6 (A) 10/03/2018    Plavix and coumadin.   Pretty active all the time.   Inject knee. Right.  Past Medical History, Surgical History, Social History, Family History, Problem List, Medications, and Allergies have been reviewed and updated if relevant.  Patient Active Problem List   Diagnosis Date Noted  . Decreased renal function 06/27/2018  . Dizziness 06/13/2018  . Prostate cancer ([place]) 06/10/2018  . Chest pain 06/04/2018  . Left knee pain 10/25/2017  . OSA (obstructive sleep apnea) 10/01/2017  . Preventative health care 10/01/2017  . Osteoarthritis 06/26/2017  . Insomnia 10/01/2014  . Depression 03/09/2014  . Elevated PSA 02/09/2014  . Coronary artery disease 11/06/2013  . Long term current use of anticoagulant therapy 06/19/2013  . Pacemaker 04/30/2013  . Acute myocardial infarction, subendocardial infarction, initial episode of care ([place]) 03/15/2013  . Hypokalemia 01/15/2013  . Abnormal LFTs 05/02/2012  . Chronic kidney disease, stage II (mild)  12/26/2011  . Chronic obstructive pulmonary disease ([place]) 04/20/2011  . Atrial fibrillation   . Hypertension   . Hyperlipidemia   . Diabetes mellitus type 2, uncomplicated ([place])   . Atrial flutter ([place])   . Tobacco abuse   . GASTROESOPHAGEAL REFLUX DISEASE 05/16/2010    Past Medical History:  Diagnosis Date  . Arthritis    "knees and lower back" (03/14/2013)  . Atrial flutter ([place])    radiofrequency ablation in 2001  . CAD (coronary artery disease)    a. Nonobstructive. Cardiac cath in 2001-50% mid RI, normal LM, LAD, RCA b. cath 10/16/2014 95% mid RCA treated with DES, 99% ost D1 medical management due to small aneurysmal segment  . Chronic anticoagulation    chronic Coumadin anticoagulation  . Chronic obstructive pulmonary disease ([place]) 04/20/2011  . Diabetes mellitus, type 2 ([place])   . GERD (gastroesophageal reflux disease)   . Hyperlipidemia   . Hypertension    with hypertensive heart disease  . Obesity   . Persistent atrial fibrillation    recurrent atrial flutter since 2001 s/p DCCVs, multiple failed AADs, h/o tachy-mediated cardiomyopathy  . Shortness of breath    "can come on at any time" (03/14/2013)  . Sleep apnea    "dx'd; couldn't wear the mask" (03/14/2013)  . Tobacco abuse     Past Surgical History:  Procedure Laterality Date  . ATRIAL FLUTTER ABLATION  2002   atrial flutter; subsequently developed atrial fibrillation  . AV NODE ABLATION  01/24/2013  .  CARDIAC CATHETERIZATION  2002  . CARDIAC CATHETERIZATION N/A 10/16/2014   Procedure: Left Heart Cath and Coronary Angiography;  Surgeon: [name], MD;  Location: [place] [facility];  Service: Cardiovascular;  Laterality: N/A;  . CARDIOVERSION  05/31/2011   Procedure: CARDIOVERSION;  Surgeon: Cristopher Estimable. Lattie Haw, MD;  Location: [facility];  Service: Cardiovascular;  Laterality: N/A;  . CARPAL TUNNEL RELEASE Left 1980's  . INSERT / REPLACE / REMOVE PACEMAKER  01/24/2013    Medtronic Adapta L dual-chamber pacemaker,  serial number [id number]   . LEFT HEART CATH AND CORONARY ANGIOGRAPHY N/A 06/07/2018   Procedure: LEFT HEART CATH AND CORONARY ANGIOGRAPHY;  Surgeon: [name], MD;  Location: [place] [facility];  Service: Cardiovascular;  Laterality: N/A;  . LEFT HEART CATHETERIZATION WITH CORONARY ANGIOGRAM N/A 03/17/2013   Procedure: LEFT HEART CATHETERIZATION WITH CORONARY ANGIOGRAM;  Surgeon: Burnell Blanks, MD; LAD mild dz, D1 branch 100%, inferior branch 99%, CFX OK, RCA 50%, EF 65%    . LOOP RECORDER IMPLANT  2002  . PERMANENT PACEMAKER INSERTION N/A 01/24/2013   Procedure: PERMANENT PACEMAKER INSERTION;  Surgeon: [name], MD;  Location: [facility] LAB;  Service: Cardiovascular;  Laterality: N/A;  . TIBIAL TUBERCLERPLASTY  ~ 2003    Social History   Socioeconomic History  . Marital status: Widowed    Spouse name: Not on file  . Number of children: 1  . Years of education: Not on file  . Highest education level: Not on file  Occupational History  . Occupation: Merchandiser, retail: UNEMPLOYED  Social Needs  . Financial resource strain: Not on file  . Food insecurity    Worry: Not on file    Inability: Not on file  . Transportation needs    Medical: Not on file    Non-medical: Not on file  Tobacco Use  . Smoking status: Former Smoker    Packs/day: 1.00    Years: 42.00    Pack years: 42.00    Types: Cigarettes    Quit date: 12/31/2011    Years since quitting: 6.9  . Smokeless tobacco: Never Used  Substance and Sexual Activity  . Alcohol use: Not Currently    Comment: 03/14/2013 "stopped drinking back in 2002; never had problem w/it"  . Drug use: No  . Sexual activity: Not Currently  Lifestyle  . Physical activity    Days per week: Not on file    Minutes per session: Not on file  . Stress: Not on file  Relationships  . Social Herbalist on phone: Not on file    Gets together: Not on file    Attends religious service: Not on file    Active member of  club or organization: Not on file    Attends meetings of clubs or organizations: Not on file    Relationship status: Not on file  . Intimate partner violence    Fear of current or ex partner: Not on file    Emotionally abused: Not on file    Physically abused: Not on file    Forced sexual activity: Not on file  Other Topics Concern  . Not on file  Social History Narrative   Single.   Retired.    1 son, deceased.    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   Enjoys playing on the computer.        Family History  Problem Relation Age of Onset  .  Alzheimer's disease Mother   . Osteoporosis Mother     No Known Allergies  Medication list reviewed and updated in full in [place].   GEN: No acute illnesses, no fevers, chills. GI: No n/v/d, eating normally Pulm: No SOB Interactive and getting along well at home.  Otherwise, ROS is as per the HPI.  Objective:   BP 100/60   Pulse 82   Temp 98.3 F (36.8 C) (Temporal)   Ht 5\' 11"  (1.803 m)   Wt 231 lb 8 oz (105 kg)   SpO2 95%   BMI 32.29 kg/m   GEN: WDWN, NAD, Non-toxic, A & O x 3 HEENT: Atraumatic, Normocephalic. Neck supple. No masses, No LAD. Ears and Nose: No external deformity. EXTR: No c/c/e NEURO Normal gait.  PSYCH: Normally interactive. Conversant. Not depressed or anxious appearing.  Calm demeanor.   Right knee: Full extension.  Flexion to 115 degrees.  Minimal effusion.  Does have some medial greater than lateral joint line tenderness.  ACL and PCL are intact.  Flexion pinch is intact and normal.  McMurray's is without pain.  Laboratory and Imaging Data:  Assessment and Plan:     ICD-10-CM   1. Primary osteoarthritis of right knee  M17.11 methylPREDNISolone acetate (DEPO-MEDROL) injection 80 mg  2. Recurrent major depressive disorder, remission status unspecified (HCC)  F33.9 escitalopram (LEXAPRO) 20 MG tablet   Arthritis flare, right knee.  Chronic  anticoagulation.  I am to do an intra-articular injection today for symptomatic relief.  Consider Visco supplementation if length of relief is not that great.  Aspiration/Injection Procedure Note KEMONI RUDELL [birth year] Date of procedure: 12/26/2018  Procedure: Large Joint Aspiration / Injection of Knee, RIGHT Indications: Pain  Procedure Details Patient verbally consented to procedure. Risks (including potential rare risk of infection), benefits, and alternatives explained. Sterilely prepped with Chloraprep. Ethyl cholride used for anesthesia. 8 cc Lidocaine 1% mixed with 2 mL Depo-Medrol 40 mg injected using the anteromedial approach without difficulty. No complications with procedure and tolerated well. Patient had decreased pain post-injection. Medication: 2 mL of Depo-Medrol 40 mg, equaling Depo-Medrol 80 mg total   Follow-up: No follow-ups on file.  Meds ordered this encounter  Medications  . escitalopram (LEXAPRO) 20 MG tablet    Sig: Take 1 tablet (20 mg total) by mouth daily. For depression.    Dispense:  90 tablet    Refill:  1  . methylPREDNISolone acetate (DEPO-MEDROL) injection 80 mg   No orders of the defined types were placed in this encounter.   Signed,  Maud Deed. Arnice Vanepps, MD   Outpatient Encounter Medications as of 12/26/2018  Medication Sig  . buPROPion (ZYBAN) 150 MG 12 hr tablet TAKE 1 TABLET BY MOUTH TWICE A DAY  . clopidogrel (PLAVIX) 75 MG tablet TAKE 1 TABLET BY MOUTH EVERY DAY  . escitalopram (LEXAPRO) 20 MG tablet Take 1 tablet (20 mg total) by mouth daily. For depression.  . fish oil-omega-3 fatty acids 1000 MG capsule Take 1 g by mouth daily.    . furosemide (LASIX) 20 MG tablet TAKE 1 TABLET BY MOUTH EVERY DAY  . gabapentin (NEURONTIN) 300 MG capsule TAKE 1 CAPSULE BY MOUTH THREE TIMES DAILY FOR DIABETIC NEUROPATHY.  Marland Kitchen glucose blood (ONE TOUCH ULTRA TEST) test strip USE TO TEST UP TO 4 TIMES DAILY  . isosorbide mononitrate (IMDUR) 30 MG 24 hr  tablet TAKE 1 TABLET BY MOUTH EVERY DAY  . JARDIANCE 10 MG TABS tablet TAKE 1 TABLET BY MOUTH  EVERY MORNING FOR DIABETES.  Marland Kitchen lisinopril (ZESTRIL) 5 MG tablet TAKE 1 TABLET (5 MG TOTAL) BY MOUTH DAILY. FOR BLOOD PRESSURE AND KIDNEY PROTECTION.  . metFORMIN (GLUCOPHAGE) 500 MG tablet TAKE 1 TABLET BY MOUTH TWICE DAILY WITH A MEAL  . metoprolol succinate (TOPROL XL) 25 MG 24 hr tablet Take 1 tablet (25 mg total) by mouth daily.  . nitroGLYCERIN (NITROSTAT) 0.4 MG SL tablet Place 1 tablet (0.4 mg total) under the tongue every 5 (five) minutes as needed for chest pain.  . pantoprazole (PROTONIX) 40 MG tablet Take 1 tablet (40 mg total) by mouth daily. For heartburn.  . rosuvastatin (CRESTOR) 20 MG tablet TAKE 1 TABLET BY MOUTH EVERY DAY IN THE EVENING FOR CHOLESTEROL  . sitaGLIPtin (JANUVIA) 100 MG tablet TAKE 1 TABLET BY MOUTH ONCE DAILY FOR DIABETES.  . tamsulosin (FLOMAX) 0.4 MG CAPS capsule Take 1 capsule (0.4 mg total) by mouth 2 (two) times daily.  Marland Kitchen warfarin (COUMADIN) 1 MG tablet TAKE 1 TABLET ON MONDAYS. PT. USES 1 MG TABLETS IN ADDITION TO 5 MG TABLETS.  Marland Kitchen warfarin (COUMADIN) 5 MG tablet TAKE 1 TABLET BY MOUTH ON MONDAYS AND 1/2 TABLET ALL OTHER DAYS  . [DISCONTINUED] escitalopram (LEXAPRO) 20 MG tablet TAKE 1 TABLET (20 MG TOTAL) BY MOUTH DAILY. FOR DEPRESSION.  . [EXPIRED] methylPREDNISolone acetate (DEPO-MEDROL) injection 80 mg    No facility-administered encounter medications on file as of 12/26/2018.

## 2018-12-27 ENCOUNTER — Encounter: Payer: Self-pay | Admitting: *Deleted

## 2018-12-27 ENCOUNTER — Ambulatory Visit: Payer: Medicare Other | Admitting: Radiation Oncology

## 2018-12-30 ENCOUNTER — Other Ambulatory Visit: Payer: Self-pay

## 2018-12-30 ENCOUNTER — Encounter
Admission: RE | Disposition: A | Discharge status: Home / Self Care | Payer: Self-pay | Source: Home / Self Care | Attending: Gastroenterology

## 2018-12-30 ENCOUNTER — Ambulatory Visit: Payer: Medicare Other | Admitting: Anesthesiology

## 2018-12-30 ENCOUNTER — Ambulatory Visit
Admission: RE | Admit: 2018-12-30 | Discharge: 2018-12-30 | Disposition: A | Discharge status: Home / Self Care | Payer: Medicare Other | Attending: Gastroenterology | Admitting: Gastroenterology

## 2018-12-30 DIAGNOSIS — I252 Old myocardial infarction: Secondary | ICD-10-CM | POA: Diagnosis not present

## 2018-12-30 DIAGNOSIS — E119 Type 2 diabetes mellitus without complications: Secondary | ICD-10-CM | POA: Diagnosis not present

## 2018-12-30 DIAGNOSIS — K5730 Diverticulosis of large intestine without perforation or abscess without bleeding: Secondary | ICD-10-CM | POA: Diagnosis not present

## 2018-12-30 DIAGNOSIS — I10 Essential (primary) hypertension: Secondary | ICD-10-CM | POA: Diagnosis not present

## 2018-12-30 DIAGNOSIS — K219 Gastro-esophageal reflux disease without esophagitis: Secondary | ICD-10-CM | POA: Diagnosis not present

## 2018-12-30 DIAGNOSIS — E785 Hyperlipidemia, unspecified: Secondary | ICD-10-CM | POA: Diagnosis not present

## 2018-12-30 DIAGNOSIS — I4819 Other persistent atrial fibrillation: Secondary | ICD-10-CM | POA: Insufficient documentation

## 2018-12-30 DIAGNOSIS — D123 Benign neoplasm of transverse colon: Secondary | ICD-10-CM | POA: Insufficient documentation

## 2018-12-30 DIAGNOSIS — D125 Benign neoplasm of sigmoid colon: Secondary | ICD-10-CM | POA: Insufficient documentation

## 2018-12-30 DIAGNOSIS — Z0001 Encounter for general adult medical examination with abnormal findings: Secondary | ICD-10-CM

## 2018-12-30 DIAGNOSIS — J449 Chronic obstructive pulmonary disease, unspecified: Secondary | ICD-10-CM | POA: Diagnosis not present

## 2018-12-30 DIAGNOSIS — Z79899 Other long term (current) drug therapy: Secondary | ICD-10-CM | POA: Insufficient documentation

## 2018-12-30 DIAGNOSIS — Z7901 Long term (current) use of anticoagulants: Secondary | ICD-10-CM | POA: Insufficient documentation

## 2018-12-30 DIAGNOSIS — Z950 Presence of cardiac pacemaker: Secondary | ICD-10-CM | POA: Diagnosis not present

## 2018-12-30 DIAGNOSIS — Z7984 Long term (current) use of oral hypoglycemic drugs: Secondary | ICD-10-CM | POA: Insufficient documentation

## 2018-12-30 DIAGNOSIS — Z87891 Personal history of nicotine dependence: Secondary | ICD-10-CM | POA: Diagnosis not present

## 2018-12-30 DIAGNOSIS — Z1211 Encounter for screening for malignant neoplasm of colon: Secondary | ICD-10-CM | POA: Diagnosis not present

## 2018-12-30 DIAGNOSIS — F329 Major depressive disorder, single episode, unspecified: Secondary | ICD-10-CM | POA: Insufficient documentation

## 2018-12-30 DIAGNOSIS — G4730 Sleep apnea, unspecified: Secondary | ICD-10-CM | POA: Insufficient documentation

## 2018-12-30 DIAGNOSIS — I2510 Atherosclerotic heart disease of native coronary artery without angina pectoris: Secondary | ICD-10-CM | POA: Diagnosis not present

## 2018-12-30 DIAGNOSIS — Z955 Presence of coronary angioplasty implant and graft: Secondary | ICD-10-CM | POA: Diagnosis not present

## 2018-12-30 DIAGNOSIS — K635 Polyp of colon: Secondary | ICD-10-CM | POA: Diagnosis not present

## 2018-12-30 DIAGNOSIS — Z7902 Long term (current) use of antithrombotics/antiplatelets: Secondary | ICD-10-CM | POA: Diagnosis not present

## 2018-12-30 HISTORY — PX: COLONOSCOPY WITH PROPOFOL: SHX5780

## 2018-12-30 LAB — GLUCOSE, CAPILLARY: Glucose-Capillary: 202 mg/dL — ABNORMAL HIGH (ref 70–99)

## 2018-12-30 SURGERY — COLONOSCOPY WITH PROPOFOL
Anesthesia: General

## 2018-12-30 MED ORDER — PROPOFOL 10 MG/ML IV BOLUS
INTRAVENOUS | Status: DC | PRN
  Administered 2018-12-30: 10 mg via INTRAVENOUS
  Administered 2018-12-30: 100 mg via INTRAVENOUS

## 2018-12-30 MED ORDER — LIDOCAINE HCL (CARDIAC) PF 100 MG/5ML IV SOSY
PREFILLED_SYRINGE | INTRAVENOUS | Status: DC | PRN
  Administered 2018-12-30: 50 mg via INTRAVENOUS

## 2018-12-30 MED ORDER — SODIUM CHLORIDE 0.9 % IV SOLN
INTRAVENOUS | Status: DC
  Administered 2018-12-30: 09:00:00 via INTRAVENOUS

## 2018-12-30 MED ORDER — PROPOFOL 500 MG/50ML IV EMUL
INTRAVENOUS | Status: DC | PRN
  Administered 2018-12-30: 130 ug/kg/min via INTRAVENOUS

## 2018-12-30 MED ORDER — SPOT INK MARKER SYRINGE KIT
PACK | SUBMUCOSAL | Status: DC | PRN
  Administered 2018-12-30: 4 mL via SUBMUCOSAL

## 2018-12-30 NOTE — Anesthesia Preprocedure Evaluation (Signed)
Anesthesia Evaluation  Patient identified by MRN, date of birth, ID band Patient awake    Reviewed: Allergy & Precautions, NPO status , Patient's Chart, lab work & pertinent test results  History of Anesthesia Complications Negative for: history of anesthetic complications  Airway Mallampati: III       Dental  (+) Upper Dentures, Lower Dentures   Pulmonary neg shortness of breath, sleep apnea (not using CPAP) , COPD (no inhalers), former smoker,           Cardiovascular hypertension, Pt. on medications + Past MI  (-) CHF + dysrhythmias Atrial Fibrillation + pacemaker      Neuro/Psych neg Seizures Depression    GI/Hepatic Neg liver ROS, GERD  Medicated and Controlled,  Endo/Other  diabetes, Type 2, Oral Hypoglycemic Agents  Renal/GU Renal InsufficiencyRenal disease     Musculoskeletal   Abdominal   Peds  Hematology   Anesthesia Other Findings   Reproductive/Obstetrics                             Anesthesia Physical Anesthesia Plan  ASA: III  Anesthesia Plan: General   Post-op Pain Management:    Induction: Intravenous  PONV Risk Score and Plan: TIVA and Propofol infusion  Airway Management Planned: Nasal Cannula  Additional Equipment:   Intra-op Plan:   Post-operative Plan:   Informed Consent: I have reviewed the patients History and Physical, chart, labs and discussed the procedure including the risks, benefits and alternatives for the proposed anesthesia with the patient or authorized representative who has indicated his/her understanding and acceptance.       Plan Discussed with:   Anesthesia Plan Comments:         Anesthesia Quick Evaluation

## 2018-12-30 NOTE — Anesthesia Post-op Follow-up Note (Signed)
Anesthesia QCDR form completed.        

## 2018-12-30 NOTE — Op Note (Signed)
[facility] Patient Name: [name] Cook Procedure Date: 12/30/2018 8:21 AM MRN: [id number] Account #: [id number] Date of Birth: [birth year] Admit Type: Outpatient Age: 68 Room: [facility] ENDO ROOM 4 Gender: Male Note Status: Finalized Procedure:            Colonoscopy Indications:          Screening for colorectal malignant neoplasm Providers:            Jonathon Bellows MD, MD Medicines:            Monitored Anesthesia Care Complications:        No immediate complications. Procedure:            Pre-Anesthesia Assessment:                       - Prior to the procedure, a History and Physical was                        performed, and patient medications, allergies and                        sensitivities were reviewed. The patient's tolerance of                        previous anesthesia was reviewed.                       - The risks and benefits of the procedure and the                        sedation options and risks were discussed with the                        patient. All questions were answered and informed                        consent was obtained.                       - ASA Grade Assessment: III - A patient with severe                        systemic disease.                       After obtaining informed consent, the colonoscope was                        passed under direct vision. Throughout the procedure,                        the patient's blood pressure, pulse, and oxygen                        saturations were monitored continuously. The                        Colonoscope was introduced through the anus and                        advanced to the the cecum, identified by the  appendiceal orifice. The colonoscopy was performed with                        ease. The patient tolerated the procedure well. The                        quality of the bowel preparation was excellent. Findings:      The perianal and digital rectal  examinations were normal.      Two sessile polyps were found in the sigmoid colon. The polyps were 4 to       6 mm in size. These polyps were removed with a cold snare. Resection and       retrieval were complete.      Multiple small-mouthed diverticula were found in the sigmoid colon.      A 15 mm polyp was found in the transverse colon. The polyp was sessile.       The polyp was removed with a piecemeal technique using a cold snare.       Resection and retrieval were complete. To prevent bleeding after the       polypectomy, two hemostatic clips were successfully placed. There was no       bleeding at the end of the procedure. Area was tattooed with an       injection of Spot (carbon black).      The exam was otherwise without abnormality on direct and retroflexion       views. Impression:           - Two 4 to 6 mm polyps in the sigmoid colon, removed                        with a cold snare. Resected and retrieved.                       - Diverticulosis in the sigmoid colon.                       - One 15 mm polyp in the transverse colon, removed                        piecemeal using a cold snare. Resected and retrieved.                        Clips were placed.                       - The examination was otherwise normal on direct and                        retroflexion views. Recommendation:       - Discharge patient to home (with escort).                       - Resume previous diet.                       - Continue present medications.                       - Await pathology results.                       -  Repeat colonoscopy in 6 months for surveillance after                        piecemeal polypectomy.                       - Discharge patient to home. Procedure Code(s):    --- Professional ---                       [phone], Colonoscopy, flexible; with removal of tumor(s),                        polyp(s), or other lesion(s) by snare technique                       [id number],  Colonoscopy, flexible; with directed submucosal                        injection(s), any substance Diagnosis Code(s):    --- Professional ---                       K63.5, Polyp of colon                       Z12.11, Encounter for screening for malignant neoplasm                        of colon                       K57.30, Diverticulosis of large intestine without                        perforation or abscess without bleeding CPT copyright 2019 American Medical Association. All rights reserved. The codes documented in this report are preliminary and upon coder review may  be revised to meet current compliance requirements. Jonathon Bellows, MD Jonathon Bellows MD, MD 12/30/2018 9:14:21 AM This report has been signed electronically. Number of Addenda: 0 Note Initiated On: 12/30/2018 8:21 AM Scope Withdrawal Time: 0 hours 22 minutes 17 seconds  Total Procedure Duration: 0 hours 26 minutes 23 seconds  Estimated Blood Loss: Estimated blood loss: none. Estimated blood loss: none.      [facility]

## 2018-12-30 NOTE — Transfer of Care (Signed)
Immediate Anesthesia Transfer of Care Note  Patient: [name] Cook  Procedure(s) Performed: COLONOSCOPY WITH PROPOFOL (N/A )  Patient Location: Endoscopy Unit  Anesthesia Type:General  Level of Consciousness: drowsy and patient cooperative  Airway & Oxygen Therapy: Patient Spontanous Breathing and Patient connected to nasal cannula oxygen  Post-op Assessment: Report given to RN and Post -op Vital signs reviewed and stable  Post vital signs: Reviewed and stable  Last Vitals:  Vitals Value Taken Time  BP 94/62 12/30/18 0922  Temp 36.7 C 12/30/18 0913  Pulse 71 12/30/18 0922  Resp 18 12/30/18 0922  SpO2 99 % 12/30/18 0922  Vitals shown include unvalidated device data.  Last Pain:  Vitals:   12/30/18 0822  TempSrc: Oral  PainSc: 0-No pain         Complications: No apparent anesthesia complications

## 2018-12-30 NOTE — H&P (Signed)
Jonathon Bellows, MD [address] [place], [place], [place], [address] [address], [place], [place], [place], [address] Phone: [phone]  Fax: [phone]  Primary Care Physician:  [name], NP   Pre-Procedure History & Physical: HPI:  [name] Cook is a 68 y.o. male is here for an colonoscopy.   Past Medical History:  Diagnosis Date  . Arthritis    "knees and lower back" (03/14/2013)  . Atrial flutter ([place])    radiofrequency ablation in 2001  . CAD (coronary artery disease)    a. Nonobstructive. Cardiac cath in 2001-50% mid RI, normal LM, LAD, RCA b. cath 10/16/2014 95% mid RCA treated with DES, 99% ost D1 medical management due to small aneurysmal segment  . Chronic anticoagulation    chronic Coumadin anticoagulation  . Chronic obstructive pulmonary disease ([place]) 04/20/2011  . Diabetes mellitus, type 2 ([place])   . GERD (gastroesophageal reflux disease)   . Hyperlipidemia   . Hypertension    with hypertensive heart disease  . Obesity   . Persistent atrial fibrillation    recurrent atrial flutter since 2001 s/p DCCVs, multiple failed AADs, h/o tachy-mediated cardiomyopathy  . Shortness of breath    "can come on at any time" (03/14/2013)  . Sleep apnea    "dx'd; couldn't wear the mask" (03/14/2013)  . Tobacco abuse     Past Surgical History:  Procedure Laterality Date  . ATRIAL FLUTTER ABLATION  2002   atrial flutter; subsequently developed atrial fibrillation  . AV NODE ABLATION  01/24/2013  . CARDIAC CATHETERIZATION  2002  . CARDIAC CATHETERIZATION N/A 10/16/2014   Procedure: Left Heart Cath and Coronary Angiography;  Surgeon: [name], MD;  Location: [facility];  Service: Cardiovascular;  Laterality: N/A;  . CARDIOVERSION  05/31/2011   Procedure: CARDIOVERSION;  Surgeon: Cristopher Estimable. Lattie Haw, MD;  Location: [facility];  Service: Cardiovascular;  Laterality: N/A;  . CARPAL TUNNEL RELEASE Left 1980's  . INSERT / REPLACE / REMOVE PACEMAKER   01/24/2013    Medtronic Adapta L dual-chamber pacemaker, serial number [id number]   . LEFT HEART CATH AND CORONARY ANGIOGRAPHY N/A 06/07/2018   Procedure: LEFT HEART CATH AND CORONARY ANGIOGRAPHY;  Surgeon: [name], MD;  Location: [facility];  Service: Cardiovascular;  Laterality: N/A;  . LEFT HEART CATHETERIZATION WITH CORONARY ANGIOGRAM N/A 03/17/2013   Procedure: LEFT HEART CATHETERIZATION WITH CORONARY ANGIOGRAM;  Surgeon: Burnell Blanks, MD; LAD mild dz, D1 branch 100%, inferior branch 99%, CFX OK, RCA 50%, EF 65%    . LOOP RECORDER IMPLANT  2002  . PERMANENT PACEMAKER INSERTION N/A 01/24/2013   Procedure: PERMANENT PACEMAKER INSERTION;  Surgeon: [name], MD;  Location: [facility];  Service: Cardiovascular;  Laterality: N/A;  . TIBIAL TUBERCLERPLASTY  ~ 2003    Prior to Admission medications   Medication Sig Start Date End Date Taking? Authorizing Provider  buPROPion (ZYBAN) 150 MG 12 hr tablet TAKE 1 TABLET BY MOUTH TWICE A DAY 10/11/18  Yes [name], NP  escitalopram (LEXAPRO) 20 MG tablet Take 1 tablet (20 mg total) by mouth daily. For depression. 12/26/18  Yes [name], NP  furosemide (LASIX) 20 MG tablet TAKE 1 TABLET BY MOUTH EVERY DAY 11/20/18  Yes [name], MD  gabapentin (NEURONTIN) 300 MG capsule TAKE 1 CAPSULE BY MOUTH THREE TIMES DAILY FOR DIABETIC NEUROPATHY. 09/02/18  Yes [name], NP  JARDIANCE 10 MG TABS tablet TAKE 1 TABLET  BY MOUTH EVERY MORNING FOR DIABETES. 11/08/18  Yes [name], NP  lisinopril (ZESTRIL) 5 MG tablet TAKE 1 TABLET (5 MG TOTAL) BY MOUTH DAILY. FOR BLOOD PRESSURE AND KIDNEY PROTECTION. 09/04/18  Yes [name], NP  metFORMIN (GLUCOPHAGE) 500 MG tablet TAKE 1 TABLET BY MOUTH TWICE DAILY WITH A MEAL 04/08/18  Yes [place], Modena Nunnery, MD  metoprolol succinate (TOPROL XL) 25 MG 24 hr tablet Take 1 tablet (25 mg total) by mouth daily. 05/10/18  Yes [name], MD  pantoprazole  (PROTONIX) 40 MG tablet Take 1 tablet (40 mg total) by mouth daily. For heartburn. 07/15/18  Yes [name], NP  rosuvastatin (CRESTOR) 20 MG tablet TAKE 1 TABLET BY MOUTH EVERY DAY IN THE EVENING FOR CHOLESTEROL 10/29/18  Yes [name], NP  clopidogrel (PLAVIX) 75 MG tablet TAKE 1 TABLET BY MOUTH EVERY DAY 06/04/18   [name], MD  fish oil-omega-3 fatty acids 1000 MG capsule Take 1 g by mouth daily.      [provider]  glucose blood (ONE TOUCH ULTRA TEST) test strip USE TO TEST UP TO 4 TIMES DAILY 10/23/17   [name], NP  isosorbide mononitrate (IMDUR) 30 MG 24 hr tablet TAKE 1 TABLET BY MOUTH EVERY DAY 10/22/18   [name], NP  nitroGLYCERIN (NITROSTAT) 0.4 MG SL tablet Place 1 tablet (0.4 mg total) under the tongue every 5 (five) minutes as needed for chest pain. 01/23/18   [name], NP  sitaGLIPtin (JANUVIA) 100 MG tablet TAKE 1 TABLET BY MOUTH ONCE DAILY FOR DIABETES. 10/16/18   [name], NP  tamsulosin (FLOMAX) 0.4 MG CAPS capsule Take 1 capsule (0.4 mg total) by mouth 2 (two) times daily. 07/24/18   Noreene Filbert, MD  warfarin (COUMADIN) 1 MG tablet TAKE 1 TABLET ON MONDAYS. PT. USES 1 MG TABLETS IN ADDITION TO 5 MG TABLETS. 08/08/17   [name], NP  warfarin (COUMADIN) 5 MG tablet TAKE 1 TABLET BY MOUTH ON MONDAYS AND 1/2 TABLET ALL OTHER DAYS 03/04/18   Susy Frizzle, MD    Allergies as of 10/17/2018  . (No Known Allergies)    Family History  Problem Relation Age of Onset  . Alzheimer's disease Mother   . Osteoporosis Mother     Social History   Socioeconomic History  . Marital status: Widowed    Spouse name: Not on file  . Number of children: 1  . Years of education: Not on file  . Highest education level: Not on file  Occupational History  . Occupation: Merchandiser, retail: UNEMPLOYED  Social Needs  . Financial resource strain: Not on file  . Food insecurity    Worry: Not on file     Inability: Not on file  . Transportation needs    Medical: Not on file    Non-medical: Not on file  Tobacco Use  . Smoking status: Former Smoker    Packs/day: 1.00    Years: 42.00    Pack years: 42.00    Types: Cigarettes    Quit date: 12/31/2011    Years since quitting: 7.0  . Smokeless tobacco: Never Used  Substance and Sexual Activity  . Alcohol use: Not Currently    Comment: 03/14/2013 "stopped drinking back in 2002; never had problem w/it"  . Drug use: No  . Sexual activity: Not Currently  Lifestyle  . Physical activity    Days per week: Not on  file    Minutes per session: Not on file  . Stress: Not on file  Relationships  . Social Herbalist on phone: Not on file    Gets together: Not on file    Attends religious service: Not on file    Active member of club or organization: Not on file    Attends meetings of clubs or organizations: Not on file    Relationship status: Not on file  . Intimate partner violence    Fear of current or ex partner: Not on file    Emotionally abused: Not on file    Physically abused: Not on file    Forced sexual activity: Not on file  Other Topics Concern  . Not on file  Social History Narrative   Single.   Retired.    1 son, deceased.    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   Enjoys playing on the computer.        Review of Systems: See HPI, otherwise negative ROS  Physical Exam: BP (!) 147/60   Pulse 72   Temp 97.8 F (36.6 C) (Oral)   Resp 18   Ht 5\' 11"  (1.803 m)   Wt 104.3 kg   SpO2 95%   BMI 32.08 kg/m  General:   Alert,  pleasant and cooperative in NAD Head:  Normocephalic and atraumatic. Neck:  Supple; no masses or thyromegaly. Lungs:  Clear throughout to auscultation, normal respiratory effort.    Heart:  +S1, +S2, Regular rate and rhythm, No edema. Abdomen:  Soft, nontender and nondistended. Normal bowel sounds, without guarding, and without rebound.    Neurologic:  Alert and  oriented x4;  grossly normal neurologically.  Impression/Plan: [name] Cook is here for an colonoscopy to be performed for Screening colonoscopy average risk   Risks, benefits, limitations, and alternatives regarding  colonoscopy have been reviewed with the patient.  Questions have been answered.  All parties agreeable.   Jonathon Bellows, MD  12/30/2018, 8:28 AM

## 2018-12-30 NOTE — Anesthesia Postprocedure Evaluation (Signed)
Anesthesia Post Note  Patient: [name] Cook  Procedure(s) Performed: COLONOSCOPY WITH PROPOFOL (N/A )  Patient location during evaluation: Endoscopy Anesthesia Type: General Level of consciousness: awake and alert Pain management: pain level controlled Vital Signs Assessment: post-procedure vital signs reviewed and stable Respiratory status: spontaneous breathing and respiratory function stable Cardiovascular status: stable Anesthetic complications: no     Last Vitals:  Vitals:   12/30/18 0913 12/30/18 0923  BP: (!) 94/58 94/62  Pulse: 71 71  Resp: 17 18  Temp: 36.7 C   SpO2: 96% 99%    Last Pain:  Vitals:   12/30/18 0923  TempSrc:   PainSc: 0-No pain                 KEPHART,WILLIAM K

## 2018-12-31 ENCOUNTER — Encounter: Payer: Self-pay | Admitting: Gastroenterology

## 2018-12-31 LAB — SURGICAL PATHOLOGY

## 2019-01-02 ENCOUNTER — Other Ambulatory Visit: Payer: Self-pay | Admitting: Family Medicine

## 2019-01-03 ENCOUNTER — Ambulatory Visit
Admission: RE | Admit: 2019-01-03 | Discharge: 2019-01-03 | Disposition: A | Discharge status: Home / Self Care | Payer: Medicare Other | Source: Ambulatory Visit | Attending: Radiation Oncology | Admitting: Radiation Oncology

## 2019-01-03 ENCOUNTER — Encounter: Payer: Self-pay | Admitting: Radiation Oncology

## 2019-01-03 ENCOUNTER — Other Ambulatory Visit: Payer: Self-pay

## 2019-01-03 VITALS — BP 87/65 | HR 87 | Temp 96.9°F | Resp 18 | Wt 225.9 lb

## 2019-01-03 DIAGNOSIS — Z923 Personal history of irradiation: Secondary | ICD-10-CM | POA: Insufficient documentation

## 2019-01-03 DIAGNOSIS — C61 Malignant neoplasm of prostate: Secondary | ICD-10-CM | POA: Diagnosis not present

## 2019-01-03 NOTE — Progress Notes (Signed)
Radiation Oncology Follow up Note  Name: [name] Cook   Date:   01/03/2019 MRN:  [id number] DOB: 10-28-1950    This 68 y.o. male presents to the clinic today for 70-month follow-up status post.  IMRT radiation therapy to his prostate for Gleason 7 adenocarcinoma presenting with a PSA of 6.1  REFERRING PROVIDER: [name], NP  HPI: Patient is a 68 year old male now about 4 months having completed IMRT radiation therapy for Gleason 7 (3+4) adenocarcinoma the prostate presenting with a PSA of 6.1 he is seen today in routine follow-up is doing well specifically denies any increased lower urinary tract symptoms diarrhea or fatigue.  His most recent PSA is less than 0.01.Marland Kitchen  COMPLICATIONS OF TREATMENT: none  FOLLOW UP COMPLIANCE: keeps appointments   PHYSICAL EXAM:  BP (!) 87/65   Pulse 87   Temp (!) 96.9 F (36.1 C)   Resp 18   Wt 225 lb 14.4 oz (102.5 kg)   BMI 31.51 kg/m  Well-developed well-nourished patient in NAD. HEENT reveals PERLA, EOMI, discs not visualized.  Oral cavity is clear. No oral mucosal lesions are identified. Neck is clear without evidence of cervical or supraclavicular adenopathy. Lungs are clear to A&P. Cardiac examination is essentially unremarkable with regular rate and rhythm without murmur rub or thrill. Abdomen is benign with no organomegaly or masses noted. Motor sensory and DTR levels are equal and symmetric in the upper and lower extremities. Cranial nerves II through XII are grossly intact. Proprioception is intact. No peripheral adenopathy or edema is identified. No motor or sensory levels are noted. Crude visual fields are within normal range.  RADIOLOGY RESULTS: No current films to review  PLAN: Present time patient is doing well under excellent biochemical control of his prostate cancer.  I am pleased with his overall progress.  I have asked to see him back in 6 months for follow-up with a PSA prior to that visit.  Patient knows to call at anytime with  any concerns.  I would like to take this opportunity to thank you for allowing me to participate in the care of your patient.Noreene Filbert, MD

## 2019-01-05 ENCOUNTER — Encounter: Payer: Self-pay | Admitting: Gastroenterology

## 2019-01-09 ENCOUNTER — Telehealth: Payer: Self-pay

## 2019-01-09 NOTE — Telephone Encounter (Signed)
Called pt to inform him of biopsy result and Dr. Georgeann Oppenheim recommendation.  Unable to contact. Result letter was mailed to pt on 01-05-19

## 2019-01-09 NOTE — Telephone Encounter (Signed)
-----   Message from Jonathon Bellows, MD sent at 01/05/2019 10:52 AM EDT ----- Please inform that the polyps are adenomas.  Since were taken out in pieces he requires a repeat colonoscopy in 6 months.  Please schedule.

## 2019-01-10 ENCOUNTER — Other Ambulatory Visit: Payer: Self-pay | Admitting: Radiation Oncology

## 2019-01-10 ENCOUNTER — Other Ambulatory Visit: Payer: Self-pay | Admitting: Primary Care

## 2019-01-10 DIAGNOSIS — K219 Gastro-esophageal reflux disease without esophagitis: Secondary | ICD-10-CM

## 2019-01-13 ENCOUNTER — Telehealth: Payer: Self-pay | Admitting: Urology

## 2019-01-13 NOTE — Telephone Encounter (Signed)
Yes, it is normal to have hot flashes. Call with any other questions.

## 2019-01-13 NOTE — Telephone Encounter (Signed)
Pt called and wants to know if it is normal to get hot flashes from Estrogen shots. Please advise.

## 2019-01-25 ENCOUNTER — Other Ambulatory Visit: Payer: Self-pay | Admitting: Primary Care

## 2019-02-06 ENCOUNTER — Ambulatory Visit (INDEPENDENT_AMBULATORY_CARE_PROVIDER_SITE_OTHER): Payer: Medicare Other | Admitting: *Deleted

## 2019-02-06 DIAGNOSIS — I4819 Other persistent atrial fibrillation: Secondary | ICD-10-CM | POA: Diagnosis not present

## 2019-02-06 DIAGNOSIS — I442 Atrioventricular block, complete: Secondary | ICD-10-CM

## 2019-02-08 LAB — CUP PACEART REMOTE DEVICE CHECK
Battery Impedance: 349 Ohm
Battery Remaining Longevity: 106 mo
Battery Voltage: 2.79 V
Brady Statistic RV Percent Paced: 100 %
Date Time Interrogation Session: 20201022221956
Implantable Lead Implant Date: 20141010
Implantable Lead Implant Date: 20141010
Implantable Lead Location: 753859
Implantable Lead Location: 753860
Implantable Lead Model: 5076
Implantable Lead Model: 5076
Implantable Pulse Generator Implant Date: 20141010
Lead Channel Impedance Value: 612 Ohm
Lead Channel Impedance Value: 67 Ohm
Lead Channel Pacing Threshold Amplitude: 0.875 V
Lead Channel Pacing Threshold Pulse Width: 0.4 ms
Lead Channel Setting Pacing Amplitude: 2.5 V
Lead Channel Setting Pacing Pulse Width: 0.4 ms
Lead Channel Setting Sensing Sensitivity: 4 mV

## 2019-02-17 NOTE — Progress Notes (Signed)
Remote pacemaker transmission.   

## 2019-03-03 ENCOUNTER — Other Ambulatory Visit: Payer: Self-pay | Admitting: Primary Care

## 2019-03-03 DIAGNOSIS — E1142 Type 2 diabetes mellitus with diabetic polyneuropathy: Secondary | ICD-10-CM

## 2019-03-03 NOTE — Telephone Encounter (Signed)
Electronic refill request Gabapentin Last refill 09/02/18 #270/1 Last office visit 12/26/18

## 2019-03-03 NOTE — Telephone Encounter (Signed)
Noted, refill sent to pharmacy. 

## 2019-03-10 ENCOUNTER — Other Ambulatory Visit: Payer: Self-pay | Admitting: Primary Care

## 2019-03-10 DIAGNOSIS — I10 Essential (primary) hypertension: Secondary | ICD-10-CM

## 2019-03-17 ENCOUNTER — Encounter: Payer: Self-pay | Admitting: Family Medicine

## 2019-03-17 ENCOUNTER — Ambulatory Visit (INDEPENDENT_AMBULATORY_CARE_PROVIDER_SITE_OTHER): Payer: Medicare Other | Admitting: Family Medicine

## 2019-03-17 ENCOUNTER — Telehealth: Payer: Self-pay | Admitting: Primary Care

## 2019-03-17 VITALS — BP 107/69 | HR 81

## 2019-03-17 DIAGNOSIS — J439 Emphysema, unspecified: Secondary | ICD-10-CM | POA: Diagnosis not present

## 2019-03-17 DIAGNOSIS — J069 Acute upper respiratory infection, unspecified: Secondary | ICD-10-CM

## 2019-03-17 MED ORDER — GUAIFENESIN-CODEINE 100-10 MG/5ML PO SOLN: 5.0000 mL | Freq: Three times a day (TID) | ORAL | 0 refills | Status: DC | PRN

## 2019-03-17 NOTE — Telephone Encounter (Signed)
Pt called schedule doxy appointment with dr cody for head cold.  He wanted to schedule a coumadin check  When does pt need to come in for this??

## 2019-03-17 NOTE — Telephone Encounter (Signed)
Ok to schedule he had a virtual with dr cody today for head cold???

## 2019-03-17 NOTE — Progress Notes (Signed)
I connected with [name] Cook on 03/17/19 at  2:00 PM EST by video and verified that I am speaking with the correct person using two identifiers.   I discussed the limitations, risks, security and privacy concerns of performing an evaluation and management service by video and the availability of in person appointments. I also discussed with the patient that there may be a patient responsible charge related to this service. The patient expressed understanding and agreed to proceed.  Patient location: Home Provider Location: [place] Participants: [name] Cook and [name] Cook   Subjective:     [name] Cook is a 68 y.o. male presenting for URI     URI  This is a new problem. The current episode started 1 to 4 weeks ago. The problem has been gradually worsening. There has been no fever. Associated symptoms include congestion, coughing, headaches and rhinorrhea. Pertinent negatives include no diarrhea, ear pain, nausea, sinus pain, sneezing, sore throat, vomiting or wheezing. Associated symptoms comments: Hoarseness . He has tried decongestant for the symptoms. The treatment provided no relief.   Step son had cold - and was negative for covid Retired    Review of Systems  Constitutional: Negative for chills and fever.  HENT: Positive for congestion and rhinorrhea. Negative for ear pain, sinus pressure, sinus pain, sneezing and sore throat.   Respiratory: Positive for cough. Negative for shortness of breath and wheezing.   Gastrointestinal: Negative for diarrhea, nausea and vomiting.  Musculoskeletal: Negative for arthralgias and myalgias.  Neurological: Positive for dizziness and headaches.     Social History   Tobacco Use  Smoking Status Former Smoker  . Packs/day: 1.00  . Years: 42.00  . Pack years: 42.00  . Types: Cigarettes  . Quit date: 12/31/2011  . Years since quitting: 7.2  Smokeless Tobacco Never Used        Objective:   BP Readings from Last 3  Encounters:  03/17/19 107/69  01/03/19 (!) 87/65  12/30/18 110/69   Wt Readings from Last 3 Encounters:  01/03/19 225 lb 14.4 oz (102.5 kg)  12/30/18 230 lb (104.3 kg)  12/26/18 231 lb 8 oz (105 kg)   BP 107/69   Pulse 81    Physical Exam Constitutional:      Appearance: Normal appearance. He is not ill-appearing.  HENT:     Head: Normocephalic and atraumatic.     Right Ear: External ear normal.     Left Ear: External ear normal.  Eyes:     Conjunctiva/sclera: Conjunctivae normal.  Pulmonary:     Effort: Pulmonary effort is normal. No respiratory distress.     Comments: No coughing or wheezing noted during call Neurological:     Mental Status: He is alert. Mental status is at baseline.  Psychiatric:        Mood and Affect: Mood normal.        Behavior: Behavior normal.        Thought Content: Thought content normal.        Judgment: Judgment normal.             Assessment & Plan:   Problem List Items Addressed This Visit      Respiratory   Chronic obstructive pulmonary disease ([place])   Relevant Medications   guaiFENesin-codeine 100-10 MG/5ML syrup    Other Visit Diagnoses    Viral URI with cough    -  Primary   Relevant Medications   guaiFENesin-codeine 100-10 MG/5ML  syrup     Pt with hx of COPD not currently needing inhaler. Given mild symptoms - no SOB or wheezing do not feel that he needs steroids/abx at this time.  Advised testing for covid and information on locations provided Call back if no improvement or worsening symptoms - would consider abx/steroids at that time  Cough syrup per request provided  Also advised saline rinse given sinus congestion  Return if symptoms worsen or fail to improve.  [name] Noe, MD

## 2019-03-17 NOTE — Telephone Encounter (Signed)
Not if he has COVID symptoms.  He will need to wait until he gets well.

## 2019-03-17 NOTE — Telephone Encounter (Signed)
It would be great if you could get patient scheduled for this Thursday if possible.  It's been a while since he was checked.  Thanks [employer]

## 2019-03-18 ENCOUNTER — Other Ambulatory Visit: Payer: Self-pay | Admitting: General Practice

## 2019-03-18 DIAGNOSIS — Z7901 Long term (current) use of anticoagulants: Secondary | ICD-10-CM

## 2019-03-18 NOTE — Telephone Encounter (Signed)
[name] Cook,  He can go to the lab while he is there to see PCP.  Can you make a lab appt for him?  I will be glad to put in a lab order.  Thanks [name] Cook

## 2019-03-18 NOTE — Telephone Encounter (Signed)
He has an appointment on 12/14 with kate can he get his coumadin checked then? He has cold now

## 2019-03-18 NOTE — Telephone Encounter (Signed)
On schedule

## 2019-03-18 NOTE — Telephone Encounter (Signed)
Thanks!  Lab order is in as well.  Jenny Reichmann

## 2019-03-25 ENCOUNTER — Other Ambulatory Visit: Payer: Self-pay | Admitting: Internal Medicine

## 2019-03-31 ENCOUNTER — Other Ambulatory Visit: Payer: Self-pay

## 2019-03-31 ENCOUNTER — Encounter: Payer: Self-pay | Admitting: Primary Care

## 2019-03-31 ENCOUNTER — Ambulatory Visit (INDEPENDENT_AMBULATORY_CARE_PROVIDER_SITE_OTHER): Payer: Medicare Other | Admitting: Primary Care

## 2019-03-31 ENCOUNTER — Other Ambulatory Visit: Payer: Medicare Other

## 2019-03-31 VITALS — BP 110/78 | HR 82 | Temp 97.5°F | Ht 71.0 in | Wt 234.2 lb

## 2019-03-31 DIAGNOSIS — Z7901 Long term (current) use of anticoagulants: Secondary | ICD-10-CM

## 2019-03-31 DIAGNOSIS — I4819 Other persistent atrial fibrillation: Secondary | ICD-10-CM | POA: Diagnosis not present

## 2019-03-31 DIAGNOSIS — E119 Type 2 diabetes mellitus without complications: Secondary | ICD-10-CM | POA: Diagnosis not present

## 2019-03-31 LAB — POCT GLYCOSYLATED HEMOGLOBIN (HGB A1C): Hemoglobin A1C: 6.6 % — AB (ref 4.0–5.6)

## 2019-03-31 LAB — POCT INR: INR: 3.8 — AB (ref 2.0–3.0)

## 2019-03-31 MED ORDER — WARFARIN SODIUM 5 MG PO TABS: ORAL_TABLET | ORAL | 0 refills | Status: DC

## 2019-03-31 NOTE — Patient Instructions (Signed)
In regards to your warfarin (Coumadin):  Reduce your Monday dose to 5 mg, stop taking the extra 1 mg. Hold your 2.5 mg dose tomorrow, resume 2.5 mg daily on Wednesday.   Continue taking your diabetes medications.  Please schedule a physical with me in 6 months.   It was a pleasure to see you today!

## 2019-03-31 NOTE — Assessment & Plan Note (Signed)
A1C today of 6.6 today which is exactly the same as it was 6 months ago.  Continue all diabetes medications as prescribed.  Managed on statin and ACE. Foot and eye exams UTD. Pneumonia vaccination UTD.  Follow up in 6 months.

## 2019-03-31 NOTE — Assessment & Plan Note (Signed)
INR today of 3.8 which is above goal of max of 3.0. Reduce Monday dose to 5 mg, hold tomorrows dose of 2.5 mg, continue 2.5 mg daily except for Monday.  Repeat in 1-2 weeks, will send results to Coumadin nurse.

## 2019-03-31 NOTE — Progress Notes (Signed)
Subjective:    Patient ID: [name] Cook, male    DOB: 1950/12/19, 68 y.o.   MRN: [id number]  HPI   This visit occurred during the SARS-CoV-2 public health emergency.  Safety protocols were in place, including screening questions prior to the visit, additional usage of staff PPE, and extensive cleaning of exam room while observing appropriate contact time as indicated for disinfecting solutions.    [name] Cook is a 68 year old male with a history of atrial fibrillation, hypertension, CAD, OSA, COPD who presents today for follow up of diabetes and INR check.  Current medications include: Metformin 500 mg BID, Jardiance 10 mg, Sitagliptan 100 mg  He is checking his blood glucose 2-3 times weekly daily and is getting readings of:  AM fasting: 120's  Highest reading: 140 Lowest reading: 120  Last A1C: 6.6 in June 2020, 6.6 today Last Eye Exam: Completed in 2020 Last Foot Exam: UTD Pneumonia Vaccination: Completed last in 2019 ACE/ARB: Lisinopril  Statin: Crestor   BP Readings from Last 3 Encounters:  03/31/19 110/78  03/17/19 107/69  01/03/19 (!) 87/65    He is taking 6 mg on Monday, and 2.5 mg on Sunday, Tuesday, Wednesday, Thursday, Friday, Saturday. INR today of 3.8, INR of 3.0 in May 2020.   Review of Systems  Eyes: Negative for visual disturbance.  Respiratory: Negative for shortness of breath.   Cardiovascular: Negative for chest pain.  Neurological: Negative for dizziness.       Chronic neuropathy        Past Medical History:  Diagnosis Date  . Arthritis    "knees and lower back" (03/14/2013)  . Atrial flutter ([place])    radiofrequency ablation in 2001  . CAD (coronary artery disease)    a. Nonobstructive. Cardiac cath in 2001-50% mid RI, normal LM, LAD, RCA b. cath 10/16/2014 95% mid RCA treated with DES, 99% ost D1 medical management due to small aneurysmal segment  . Chronic anticoagulation    chronic Coumadin anticoagulation  . Chronic obstructive pulmonary  disease ([place]) 04/20/2011  . Diabetes mellitus, type 2 ([place])   . GERD (gastroesophageal reflux disease)   . Hyperlipidemia   . Hypertension    with hypertensive heart disease  . Obesity   . Persistent atrial fibrillation (HCC)    recurrent atrial flutter since 2001 s/p DCCVs, multiple failed AADs, h/o tachy-mediated cardiomyopathy  . Shortness of breath    "can come on at any time" (03/14/2013)  . Sleep apnea    "dx'd; couldn't wear the mask" (03/14/2013)  . Tobacco abuse      Social History   Socioeconomic History  . Marital status: Widowed    Spouse name: Not on file  . Number of children: 1  . Years of education: Not on file  . Highest education level: Not on file  Occupational History  . Occupation: Merchandiser, retail: UNEMPLOYED  Tobacco Use  . Smoking status: Former Smoker    Packs/day: 1.00    Years: 42.00    Pack years: 42.00    Types: Cigarettes    Quit date: 12/31/2011    Years since quitting: 7.2  . Smokeless tobacco: Never Used  Substance and Sexual Activity  . Alcohol use: Not Currently    Comment: 03/14/2013 "stopped drinking back in 2002; never had problem w/it"  . Drug use: No  . Sexual activity: Not Currently  Other Topics Concern  . Not on file  Social History Narrative   Single.  Retired.    1 son, deceased.    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   Enjoys playing on the computer.       Social Determinants of Health   Financial Resource Strain:   . Difficulty of Paying Living Expenses: Not on file  Food Insecurity:   . Worried About Charity fundraiser in the Last Year: Not on file  . Ran Out of Food in the Last Year: Not on file  Transportation Needs:   . Lack of Transportation (Medical): Not on file  . Lack of Transportation (Non-Medical): Not on file  Physical Activity:   . Days of Exercise per Week: Not on file  . Minutes of Exercise per Session: Not on file  Stress:   . Feeling of  Stress : Not on file  Social Connections:   . Frequency of Communication with Friends and Family: Not on file  . Frequency of Social Gatherings with Friends and Family: Not on file  . Attends Religious Services: Not on file  . Active Member of Clubs or Organizations: Not on file  . Attends Archivist Meetings: Not on file  . Marital Status: Not on file  Intimate Partner Violence:   . Fear of Current or Ex-Partner: Not on file  . Emotionally Abused: Not on file  . Physically Abused: Not on file  . Sexually Abused: Not on file    Past Surgical History:  Procedure Laterality Date  . ATRIAL FLUTTER ABLATION  2002   atrial flutter; subsequently developed atrial fibrillation  . AV NODE ABLATION  01/24/2013  . CARDIAC CATHETERIZATION  2002  . CARDIAC CATHETERIZATION N/A 10/16/2014   Procedure: Left Heart Cath and Coronary Angiography;  Surgeon: [name], MD;  Location: [facility];  Service: Cardiovascular;  Laterality: N/A;  . CARDIOVERSION  05/31/2011   Procedure: CARDIOVERSION;  Surgeon: Cristopher Estimable. Lattie Haw, MD;  Location: [facility];  Service: Cardiovascular;  Laterality: N/A;  . CARPAL TUNNEL RELEASE Left 1980's  . COLONOSCOPY WITH PROPOFOL N/A 12/30/2018   Procedure: COLONOSCOPY WITH PROPOFOL;  Surgeon: Jonathon Bellows, MD;  Location: [facility];  Service: Gastroenterology;  Laterality: N/A;  . INSERT / REPLACE / REMOVE PACEMAKER  01/24/2013    Medtronic Adapta L dual-chamber pacemaker, serial number [id number]   . LEFT HEART CATH AND CORONARY ANGIOGRAPHY N/A 06/07/2018   Procedure: LEFT HEART CATH AND CORONARY ANGIOGRAPHY;  Surgeon: [name], MD;  Location: [place] [facility];  Service: Cardiovascular;  Laterality: N/A;  . LEFT HEART CATHETERIZATION WITH CORONARY ANGIOGRAM N/A 03/17/2013   Procedure: LEFT HEART CATHETERIZATION WITH CORONARY ANGIOGRAM;  Surgeon: Burnell Blanks, MD; LAD mild dz, D1 branch 100%, inferior branch 99%, CFX OK, RCA 50%, EF 65%      . LOOP RECORDER IMPLANT  2002  . PERMANENT PACEMAKER INSERTION N/A 01/24/2013   Procedure: PERMANENT PACEMAKER INSERTION;  Surgeon: [name], MD;  Location: [facility] C[facility] LAB;  Service: Cardiovascular;  Laterality: N/A;  . TIBIAL TUBERCLERPLASTY  ~ 2003    Family History  Problem Relation Age of Onset  . Alzheimer's disease Mother   . Osteoporosis Mother     No Known Allergies  Current Outpatient Medications on File Prior to Visit  Medication Sig Dispense Refill  . buPROPion (ZYBAN) 150 MG 12 hr tablet TAKE 1 TABLET BY MOUTH TWICE A DAY 180 tablet 1  . clopidogrel (PLAVIX) 75 MG tablet TAKE 1 TABLET BY MOUTH EVERY DAY 90  tablet 0  . escitalopram (LEXAPRO) 20 MG tablet Take 1 tablet (20 mg total) by mouth daily. For depression. 90 tablet 1  . fish oil-omega-3 fatty acids 1000 MG capsule Take 1 g by mouth daily.      . furosemide (LASIX) 20 MG tablet TAKE 1 TABLET BY MOUTH EVERY DAY 90 tablet 2  . gabapentin (NEURONTIN) 300 MG capsule TAKE 1 CAPSULE BY MOUTH THREE TIMES DAILY FOR DIABETIC NEUROPATHY. 270 capsule 1  . glucose blood (ONE TOUCH ULTRA TEST) test strip USE TO TEST UP TO 4 TIMES DAILY 100 each 2  . guaiFENesin-codeine 100-10 MG/5ML syrup Take 5 mLs by mouth 3 (three) times daily as needed for cough. 120 mL 0  . isosorbide mononitrate (IMDUR) 30 MG 24 hr tablet TAKE 1 TABLET BY MOUTH EVERY DAY 90 tablet 1  . JARDIANCE 10 MG TABS tablet TAKE 1 TABLET BY MOUTH EVERY MORNING FOR DIABETES. 90 tablet 1  . lisinopril (ZESTRIL) 5 MG tablet TAKE 1 TABLET (5 MG TOTAL) BY MOUTH DAILY. FOR BLOOD PRESSURE AND KIDNEY PROTECTION. 90 tablet 1  . metFORMIN (GLUCOPHAGE) 500 MG tablet TAKE 1 TABLET BY MOUTH TWICE DAILY WITH A MEAL 180 tablet 1  . metoprolol succinate (TOPROL XL) 25 MG 24 hr tablet Take 1 tablet (25 mg total) by mouth daily. 90 tablet 3  . nitroGLYCERIN (NITROSTAT) 0.4 MG SL tablet Place 1 tablet (0.4 mg total) under the tongue every 5 (five) minutes as needed for chest pain. 25  tablet 0  . pantoprazole (PROTONIX) 40 MG tablet TAKE 1 TABLET (40 MG TOTAL) BY MOUTH DAILY. FOR HEARTBURN. 90 tablet 1  . rosuvastatin (CRESTOR) 20 MG tablet TAKE 1 TABLET BY MOUTH EVERY DAY IN THE EVENING FOR CHOLESTEROL 90 tablet 1  . sitaGLIPtin (JANUVIA) 100 MG tablet TAKE 1 TABLET BY MOUTH ONCE DAILY FOR DIABETES. 90 tablet 1  . tamsulosin (FLOMAX) 0.4 MG CAPS capsule TAKE 1 CAPSULE (0.4 MG TOTAL) BY MOUTH 2 (TWO) TIMES DAILY. 180 capsule 3   No current facility-administered medications on file prior to visit.    BP 110/78   Pulse 82   Temp (!) 97.5 F (36.4 C) (Temporal)   Ht 5\' 11"  (1.803 m)   Wt 234 lb 4 oz (106.3 kg)   SpO2 96%   BMI 32.67 kg/m    Objective:   Physical Exam  Constitutional: He appears well-nourished.  Cardiovascular: Normal rate.  Respiratory: Effort normal and breath sounds normal.  Musculoskeletal:     Cervical back: Neck supple.  Skin: Skin is warm and dry.  Psychiatric: He has a normal mood and affect.           Assessment & Plan:

## 2019-03-31 NOTE — Assessment & Plan Note (Signed)
Stable. Compliant to prescribed warfarin.  INR today of 3.8 which is above goal of max of 3.0. Reduce Monday dose to 5 mg, hold tomorrows dose of 2.5 mg as he's already had 6 mg today, continue 2.5 mg daily except for Monday.  Repeat in 1-2 weeks, will send results to Coumadin nurse.

## 2019-04-03 ENCOUNTER — Telehealth: Payer: Self-pay | Admitting: Urology

## 2019-04-03 NOTE — Telephone Encounter (Signed)
Pt states that he would like something called in for his sex drive. He states that he has had Prostate cancer and was told that if he lost his sex drive that something could be called in for him.

## 2019-04-03 NOTE — Telephone Encounter (Signed)
I do not recall having this conversation nor is it documented.  His sex drive should slowly return as his testosterone level started to rise.  There is nothing really that is going to increase his libido at this point in time.  Perhaps he is referring to something for erectile dysfunction?  If that is the case, he needs to come in and be evaluated.  Because he is on isosorbide, PD 5 inhibitors are contraindicated and thus will have to have a more in depth conversation.  He can see Shanno/ Sam or have a virtual visit if he would like.  Hollice Espy, MD

## 2019-04-03 NOTE — Telephone Encounter (Addendum)
Patient informed-would like to have an office visit with Larene Beach to discuss ED further. Made appointment-voiced understanding.

## 2019-04-06 ENCOUNTER — Other Ambulatory Visit: Payer: Self-pay | Admitting: Primary Care

## 2019-04-06 DIAGNOSIS — E119 Type 2 diabetes mellitus without complications: Secondary | ICD-10-CM

## 2019-04-06 DIAGNOSIS — F339 Major depressive disorder, recurrent, unspecified: Secondary | ICD-10-CM

## 2019-04-07 ENCOUNTER — Other Ambulatory Visit (INDEPENDENT_AMBULATORY_CARE_PROVIDER_SITE_OTHER): Payer: Medicare Other

## 2019-04-07 ENCOUNTER — Ambulatory Visit (INDEPENDENT_AMBULATORY_CARE_PROVIDER_SITE_OTHER): Payer: Medicare Other | Admitting: General Practice

## 2019-04-07 DIAGNOSIS — Z7901 Long term (current) use of anticoagulants: Secondary | ICD-10-CM

## 2019-04-07 LAB — POCT INR: INR: 2.3 (ref 2.0–3.0)

## 2019-04-07 NOTE — Patient Instructions (Signed)
Pre visit review using our clinic review tool, if applicable. No additional management support is needed unless otherwise documented below in the visit note.  Continue to take 1/2  tablet (2.5 mg) daily.    Re-check in 4 weeks .  Pt. has been given dosing instructions and he verbalized understanding.

## 2019-04-14 NOTE — Progress Notes (Signed)
04/15/2019 10:11 AM   [name] Cook May 26, 1950 [id number]  Referring provider: [name], NP [address],  [place] [address]  Chief Complaint  Patient presents with  . Prostate Cancer    HPI: [name] Cook is a 68 y.o. male with personal history of prostate cancer and ED who presents today to discuss treatment for ED.    Prostate cancer He underwent prostate biopsy in 04/2018.  This revealed Prostate biopsy revealed 6/12 cores, all on the right including 3 cores of Gleason 3+3 (up to 99%) and 3 cores of 3+4 up to 96%.  High PSA 6.1.  Rectal exam was abnormal with a nodule at the right apex and firm on the side.  TRUS vol 50 with small median lobe, irregular on right.  Ultimately, he elected treatment with EBRT and short-term Lupron.  He received his dose in 06/2018.  He completed radiation in 08/2018.  This was relatively well-tolerated.  He reports some hot flashes and worsened erectile dysfunction since initiation of ADT.  He denies difficulty urinating and the sensation of incomplete bladder emptying.  He does have some urinary urgency which is also improving.  No dysuria or gross hematuria.  PSA undectable  Erectile dysfunction SHIM score: 10      Main complaint: No erections x several months (since the radiation) Risk factors:  age, pelvic radiation, prostate cancer, DM, HTN, HLD, sleep apnea and CAD.     No painful erections or curvatures with his erections.    No longer having spontaneous erections.  Tried:  Nothing to date.    SHIM    Row Name 04/15/19 0947         SHIM: Over the last 6 months:   How do you rate your confidence that you could get and keep an erection?  Moderate     When you had erections with sexual stimulation, how often were your erections hard enough for penetration (entering your partner)?  Most Times (much more than half the time)     During sexual intercourse, how often were you able to maintain your erection after you had penetrated  (entered) your partner?  Almost Never or Never     During sexual intercourse, how difficult was it to maintain your erection to completion of intercourse?  Extremely Difficult     When you attempted sexual intercourse, how often was it satisfactory for you?  Almost Never or Never       SHIM Total Score   SHIM  10        Score: 1-7 Severe ED 8-11 Moderate ED 12-16 Mild-Moderate ED 17-21 Mild ED 22-25 No ED   PMH: Past Medical History:  Diagnosis Date  . Arthritis    "knees and lower back" (03/14/2013)  . Atrial flutter ([place])    radiofrequency ablation in 2001  . CAD (coronary artery disease)    a. Nonobstructive. Cardiac cath in 2001-50% mid RI, normal LM, LAD, RCA b. cath 10/16/2014 95% mid RCA treated with DES, 99% ost D1 medical management due to small aneurysmal segment  . Chronic anticoagulation    chronic Coumadin anticoagulation  . Chronic obstructive pulmonary disease ([place]) 04/20/2011  . Diabetes mellitus, type 2 ([place])   . GERD (gastroesophageal reflux disease)   . Hyperlipidemia   . Hypertension    with hypertensive heart disease  . Obesity   . Persistent atrial fibrillation (HCC)    recurrent atrial flutter since 2001 s/p DCCVs, multiple failed AADs,  h/o tachy-mediated cardiomyopathy  . Shortness of breath    "can come on at any time" (03/14/2013)  . Sleep apnea    "dx'd; couldn't wear the mask" (03/14/2013)  . Tobacco abuse     Surgical History: Past Surgical History:  Procedure Laterality Date  . ATRIAL FLUTTER ABLATION  2002   atrial flutter; subsequently developed atrial fibrillation  . AV NODE ABLATION  01/24/2013  . CARDIAC CATHETERIZATION  2002  . CARDIAC CATHETERIZATION N/A 10/16/2014   Procedure: Left Heart Cath and Coronary Angiography;  Surgeon: [name], MD;  Location: [place] [facility];  Service: Cardiovascular;  Laterality: N/A;  . CARDIOVERSION  05/31/2011   Procedure: CARDIOVERSION;  Surgeon: Cristopher Estimable. Lattie Haw, MD;  Location: [facility];   Service: Cardiovascular;  Laterality: N/A;  . CARPAL TUNNEL RELEASE Left 1980's  . COLONOSCOPY WITH PROPOFOL N/A 12/30/2018   Procedure: COLONOSCOPY WITH PROPOFOL;  Surgeon: Jonathon Bellows, MD;  Location: [facility] ENDOSCOPY;  Service: Gastroenterology;  Laterality: N/A;  . INSERT / REPLACE / REMOVE PACEMAKER  01/24/2013    Medtronic Adapta L dual-chamber pacemaker, serial number [id number]   . LEFT HEART CATH AND CORONARY ANGIOGRAPHY N/A 06/07/2018   Procedure: LEFT HEART CATH AND CORONARY ANGIOGRAPHY;  Surgeon: [name], MD;  Location: [place] [facility];  Service: Cardiovascular;  Laterality: N/A;  . LEFT HEART CATHETERIZATION WITH CORONARY ANGIOGRAM N/A 03/17/2013   Procedure: LEFT HEART CATHETERIZATION WITH CORONARY ANGIOGRAM;  Surgeon: Burnell Blanks, MD; LAD mild dz, D1 branch 100%, inferior branch 99%, CFX OK, RCA 50%, EF 65%    . LOOP RECORDER IMPLANT  2002  . PERMANENT PACEMAKER INSERTION N/A 01/24/2013   Procedure: PERMANENT PACEMAKER INSERTION;  Surgeon: [name], MD;  Location: [facility] C[facility] LAB;  Service: Cardiovascular;  Laterality: N/A;  . TIBIAL TUBERCLERPLASTY  ~ 2003    Home Medications:  Allergies as of 04/15/2019   No Known Allergies     Medication List       Accurate as of April 15, 2019 10:11 AM. If you have any questions, ask your nurse or doctor.        buPROPion 150 MG 12 hr tablet Commonly known as: ZYBAN TAKE 1 TABLET BY MOUTH TWICE A DAY   clopidogrel 75 MG tablet Commonly known as: PLAVIX TAKE 1 TABLET BY MOUTH EVERY DAY   escitalopram 20 MG tablet Commonly known as: LEXAPRO Take 1 tablet (20 mg total) by mouth daily. For depression.   fish oil-omega-3 fatty acids 1000 MG capsule Take 1 g by mouth daily.   furosemide 20 MG tablet Commonly known as: LASIX TAKE 1 TABLET BY MOUTH EVERY DAY   gabapentin 300 MG capsule Commonly known as: NEURONTIN TAKE 1 CAPSULE BY MOUTH THREE TIMES DAILY FOR DIABETIC NEUROPATHY.   glucose blood test  strip Commonly known as: ONE TOUCH ULTRA TEST USE TO TEST UP TO 4 TIMES DAILY   guaiFENesin-codeine 100-10 MG/5ML syrup Take 5 mLs by mouth 3 (three) times daily as needed for cough.   isosorbide mononitrate 30 MG 24 hr tablet Commonly known as: IMDUR TAKE 1 TABLET BY MOUTH EVERY DAY   Jardiance 10 MG Tabs tablet Generic drug: empagliflozin TAKE 1 TABLET BY MOUTH EVERY MORNING FOR DIABETES.   lisinopril 5 MG tablet Commonly known as: ZESTRIL TAKE 1 TABLET (5 MG TOTAL) BY MOUTH DAILY. FOR BLOOD PRESSURE AND KIDNEY PROTECTION.   metFORMIN 500 MG tablet Commonly known as: GLUCOPHAGE TAKE 1 TABLET BY MOUTH TWICE DAILY WITH A MEAL  metoprolol succinate 25 MG 24 hr tablet Commonly known as: Toprol XL Take 1 tablet (25 mg total) by mouth daily.   nitroGLYCERIN 0.4 MG SL tablet Commonly known as: NITROSTAT Place 1 tablet (0.4 mg total) under the tongue every 5 (five) minutes as needed for chest pain.   pantoprazole 40 MG tablet Commonly known as: PROTONIX TAKE 1 TABLET (40 MG TOTAL) BY MOUTH DAILY. FOR HEARTBURN.   rosuvastatin 20 MG tablet Commonly known as: CRESTOR TAKE 1 TABLET BY MOUTH EVERY DAY IN THE EVENING FOR CHOLESTEROL   sitaGLIPtin 100 MG tablet Commonly known as: Januvia TAKE 1 TABLET BY MOUTH ONCE DAILY FOR DIABETES.   tamsulosin 0.4 MG Caps capsule Commonly known as: FLOMAX TAKE 1 CAPSULE (0.4 MG TOTAL) BY MOUTH 2 (TWO) TIMES DAILY.   warfarin 5 MG tablet Commonly known as: COUMADIN Take as directed by the anticoagulation clinic. If you are unsure how to take this medication, talk to your nurse or doctor. Original instructions: Take 5 mg on Monday, take 2.5 mg every day except for Monday.       Allergies: No Known Allergies  Family History: Family History  Problem Relation Age of Onset  . Alzheimer's disease Mother   . Osteoporosis Mother     Social History:  reports that he quit smoking about 7 years ago. His smoking use included cigarettes. He  has a 42.00 pack-year smoking history. He has never used smokeless tobacco. He reports previous alcohol use. He reports that he does not use drugs.  ROS: UROLOGY Frequent Urination?: No Hard to postpone urination?: No Burning/pain with urination?: No Get up at night to urinate?: No Leakage of urine?: No Urine stream starts and stops?: No Trouble starting stream?: No Do you have to strain to urinate?: No Blood in urine?: No Urinary tract infection?: No Sexually transmitted disease?: No Injury to kidneys or bladder?: No Painful intercourse?: No Weak stream?: No Erection problems?: No Penile pain?: No  Gastrointestinal Nausea?: No Vomiting?: No Indigestion/heartburn?: No Diarrhea?: No Constipation?: No  Constitutional Fever: No Night sweats?: No Weight loss?: No Fatigue?: No  Skin Skin rash/lesions?: No Itching?: No  Eyes Blurred vision?: No Double vision?: No  Ears/Nose/Throat Sore throat?: No Sinus problems?: No  Hematologic/Lymphatic Swollen glands?: No Easy bruising?: No  Cardiovascular Leg swelling?: No Chest pain?: No  Respiratory Cough?: No Shortness of breath?: No  Endocrine Excessive thirst?: No  Musculoskeletal Back pain?: No Joint pain?: No  Neurological Headaches?: No Dizziness?: No  Psychologic Depression?: No Anxiety?: No  Physical Exam: BP 101/64   Pulse 89   Ht 5\' 11"  (1.803 m)   Wt 230 lb 1.6 oz (104.4 kg)   BMI 32.09 kg/m   Constitutional:  Well nourished. Alert and oriented, No acute distress. HEENT: [place] AT, moist mucus membranes.  Trachea midline, no masses. Cardiovascular: No clubbing, cyanosis, or edema. Respiratory: Normal respiratory effort, no increased work of breathing. Neurologic: Grossly intact, no focal deficits, moving all 4 extremities. Psychiatric: Normal mood and affect.  Laboratory Data: Lab Results  Component Value Date   WBC 6.1 08/14/2018   HGB 14.3 08/14/2018   HCT 43.0 08/14/2018   MCV  97.3 08/14/2018   PLT 136 (L) 08/14/2018    Lab Results  Component Value Date   CREATININE 1.16 06/27/2018    Lab Results  Component Value Date   PSA 6.10 (H) 09/21/2017   PSA 4.33 (H) 10/27/2015   PSA 4.71 (H) 06/22/2014     Lab Results  Component Value  Date   HGBA1C 6.6 (A) 03/31/2019    Assessment & Plan:    1. Erectile dysfunction SHIM score is 10 We will obtain a TSH level at this time - if it is abnormal will refer to PCP We discussed the fact that he is on Imdur he is not a candidate for the PDE 5 inhibitors at this time.   We discussed intra-urethral suppositories, intracavernous vasoactive drug injection therapy, vacuum constriction device and penile prosthesis implantation and the protocols, pro's and con's of each treatment.  I explained that typically erectile dysfunction treatment is an out of pocket expense for the exception of the penile prosthesis implantation as that is a surgical treatment. At this time, he stated he would like to defer any treatments for his erectile dysfunction. I gave him literature on the forms of treatment we have discussed so that he can review them at home and encouraged to call with any questions or if he has decided to pursue any of the forms of treatments we discussed.  2. Prostate cancer ([place]) Status post IMRT completed 08/2018 and ADT x6 months PSA < 0.01 12/2018 Appointment 05/27/2019 with Dr. Erlene Quan  Return for keep appointment with Dr. Erlene Quan on 05/27/2019 .  Zara Council, PA-C  [facility] [address] [place], [place] [address] [phone]  I spent 25 with this patient in a face to face conversation of which greater than 50% was spent in counseling and coordination of care with the patient.

## 2019-04-15 ENCOUNTER — Ambulatory Visit (INDEPENDENT_AMBULATORY_CARE_PROVIDER_SITE_OTHER): Payer: Medicare Other | Admitting: Urology

## 2019-04-15 ENCOUNTER — Other Ambulatory Visit: Payer: Self-pay

## 2019-04-15 ENCOUNTER — Encounter: Payer: Self-pay | Admitting: Urology

## 2019-04-15 VITALS — BP 101/64 | HR 89 | Ht 71.0 in | Wt 230.1 lb

## 2019-04-15 DIAGNOSIS — C61 Malignant neoplasm of prostate: Secondary | ICD-10-CM

## 2019-04-15 DIAGNOSIS — N529 Male erectile dysfunction, unspecified: Secondary | ICD-10-CM | POA: Diagnosis not present

## 2019-04-16 LAB — TSH: TSH: 2.03 u[IU]/mL (ref 0.450–4.500)

## 2019-04-27 ENCOUNTER — Other Ambulatory Visit: Payer: Self-pay | Admitting: Internal Medicine

## 2019-04-28 ENCOUNTER — Other Ambulatory Visit: Payer: Self-pay | Admitting: Primary Care

## 2019-04-28 DIAGNOSIS — E785 Hyperlipidemia, unspecified: Secondary | ICD-10-CM

## 2019-05-01 ENCOUNTER — Other Ambulatory Visit: Payer: Self-pay | Admitting: Primary Care

## 2019-05-01 DIAGNOSIS — E119 Type 2 diabetes mellitus without complications: Secondary | ICD-10-CM

## 2019-05-08 ENCOUNTER — Ambulatory Visit (INDEPENDENT_AMBULATORY_CARE_PROVIDER_SITE_OTHER): Payer: Medicare Other | Admitting: *Deleted

## 2019-05-08 DIAGNOSIS — Z950 Presence of cardiac pacemaker: Secondary | ICD-10-CM

## 2019-05-08 LAB — CUP PACEART REMOTE DEVICE CHECK
Battery Impedance: 373 Ohm
Battery Remaining Longevity: 105 mo
Battery Voltage: 2.8 V
Brady Statistic RV Percent Paced: 100 %
Date Time Interrogation Session: 20210121072940
Implantable Lead Implant Date: 20141010
Implantable Lead Implant Date: 20141010
Implantable Lead Location: 753859
Implantable Lead Location: 753860
Implantable Lead Model: 5076
Implantable Lead Model: 5076
Implantable Pulse Generator Implant Date: 20141010
Lead Channel Impedance Value: 67 Ohm
Lead Channel Impedance Value: 686 Ohm
Lead Channel Pacing Threshold Amplitude: 0.875 V
Lead Channel Pacing Threshold Pulse Width: 0.4 ms
Lead Channel Setting Pacing Amplitude: 2.5 V
Lead Channel Setting Pacing Pulse Width: 0.4 ms
Lead Channel Setting Sensing Sensitivity: 4 mV

## 2019-05-15 ENCOUNTER — Other Ambulatory Visit: Payer: Self-pay

## 2019-05-15 ENCOUNTER — Encounter: Payer: Self-pay | Admitting: Family Medicine

## 2019-05-15 ENCOUNTER — Ambulatory Visit (INDEPENDENT_AMBULATORY_CARE_PROVIDER_SITE_OTHER): Payer: Medicare Other

## 2019-05-15 ENCOUNTER — Ambulatory Visit (INDEPENDENT_AMBULATORY_CARE_PROVIDER_SITE_OTHER): Payer: Medicare Other | Admitting: Family Medicine

## 2019-05-15 VITALS — BP 92/64 | HR 98 | Temp 97.3°F | Ht 71.0 in | Wt 235.5 lb

## 2019-05-15 DIAGNOSIS — S40022A Contusion of left upper arm, initial encounter: Secondary | ICD-10-CM

## 2019-05-15 DIAGNOSIS — M1711 Unilateral primary osteoarthritis, right knee: Secondary | ICD-10-CM | POA: Diagnosis not present

## 2019-05-15 DIAGNOSIS — Z9181 History of falling: Secondary | ICD-10-CM | POA: Diagnosis not present

## 2019-05-15 DIAGNOSIS — Z7901 Long term (current) use of anticoagulants: Secondary | ICD-10-CM

## 2019-05-15 LAB — POCT INR: INR: 2.5 (ref 2.0–3.0)

## 2019-05-15 NOTE — Patient Instructions (Addendum)
Pre visit review using our clinic review tool, if applicable. No additional management support is needed unless otherwise documented below in the visit note.  Continue to take 1/2  tablet (2.5 mg) daily, except take 1 tablet (5mg ) on Monday.    Re-check in 6 weeks .

## 2019-05-15 NOTE — Progress Notes (Signed)
[name] Cook T. [name] Newport, MD Primary Care and Sports Medicine [facility] at [facility] [place], [address] Phone: [phone]  FAX: [phone]  [name] Cook - 69 y.o. male  MRN [id number]  Date of Birth: [birth year]  Visit Date: 05/15/2019  PCP: [name] Koch, NP  Referred by: [name] Koch, NP  Chief Complaint  Patient presents with  . Arm Pain    Left Forearm    This visit occurred during the SARS-CoV-2 public health emergency.  Safety protocols were in place, including screening questions prior to the visit, additional usage of staff PPE, and extensive cleaning of exam room while observing appropriate contact time as indicated for disinfecting solutions.   Subjective:   [name] Cook is a 69 y.o. very pleasant male patient with Body mass index is 32.85 kg/m. who presents with the following:  Pulled something in his L arm one weka  Ago.  He is a nice gentleman and he had a forearm strain about 1 week ago in his left forearm.  Since then he has had some mild pain and he is here  Forearm hematoma - ace  Pt leg balance - [place].  [address] he also had some questions about what he could potentially do for his ongoing knee pain.  He does have multiple medical problems.  I have seen him for this in the past.  He did have some injections in the knee but he did not have prolonged relief of symptoms.  Past Medical History, Surgical History, Social History, Family History, Problem List, Medications, and Allergies have been reviewed and updated if relevant.   GEN: No fevers, chills. Nontoxic. Primarily MSK c/o today. MSK: Detailed in the HPI GI: tolerating PO intake without difficulty Neuro: No numbness, parasthesias, or tingling associated. Otherwise the pertinent positives of the ROS are noted above.   Objective:   BP 92/64   Pulse 98   Temp (!) 97.3 F (36.3 C) (Temporal)   Ht 5\' 11"  (1.803 m)   Wt 235 lb 8 oz (106.8 kg)    SpO2 98%   BMI 32.85 kg/m    GEN: WDWN, NAD, Non-toxic, Alert & Oriented x 3 HEENT: Atraumatic, Normocephalic.  Ears and Nose: No external deformity. EXTR: No clubbing/cyanosis/edema NEURO: Normal gait.  PSYCH: Normally interactive. Conversant. Not depressed or anxious appearing.  Calm demeanor.    In the left forearm at the area of maximal tenderness there is some elevation and texture of the fluid-filled area.  He has some continued joint line pain as well as pain with compression of the patella.  Radiology:  Assessment and Plan:     ICD-10-CM   1. Hematoma of arm, left, initial encounter  S40.022A   2. At moderate risk for fall  Z91.81   3. Primary osteoarthritis of right knee  M17.11    Level of Medical Decision-Making in this case is Moderate.   I reassured him about his forearm and I think that it will recover without any intervention.  He is on Plavix and Coumadin which will likely increase the length of time for recovery.  I gave him a Ace bandage and showed him how to lightly wrap this.  We also talked about his knee pain is been ongoing and he wondered about specific options.  He feels like he is weak and having some trouble with his balance now.  I think that the best option for him is to do some physical  therapy to help with his strength and balance.  Follow-up: No follow-ups on file.  No orders of the defined types were placed in this encounter.  Modified Medications   No medications on file   No orders of the defined types were placed in this encounter.   Signed,  Maud Deed. Deckard Stuber, MD   Outpatient Encounter Medications as of 05/15/2019  Medication Sig  . buPROPion (ZYBAN) 150 MG 12 hr tablet TAKE 1 TABLET BY MOUTH TWICE A DAY  . clopidogrel (PLAVIX) 75 MG tablet TAKE 1 TABLET BY MOUTH EVERY DAY  . escitalopram (LEXAPRO) 20 MG tablet Take 1 tablet (20 mg total) by mouth daily. For depression.  . fish oil-omega-3 fatty acids 1000 MG capsule Take 1 g  by mouth daily.    . fluticasone (FLONASE) 50 MCG/ACT nasal spray SMARTSIG:1 Spray(s) Both Nares Daily PRN  . furosemide (LASIX) 20 MG tablet TAKE 1 TABLET BY MOUTH EVERY DAY  . gabapentin (NEURONTIN) 300 MG capsule TAKE 1 CAPSULE BY MOUTH THREE TIMES DAILY FOR DIABETIC NEUROPATHY.  Marland Kitchen glucose blood (ONE TOUCH ULTRA TEST) test strip USE TO TEST UP TO 4 TIMES DAILY  . guaiFENesin-codeine 100-10 MG/5ML syrup Take 5 mLs by mouth 3 (three) times daily as needed for cough.  . isosorbide mononitrate (IMDUR) 30 MG 24 hr tablet TAKE 1 TABLET BY MOUTH EVERY DAY  . JARDIANCE 10 MG TABS tablet TAKE 1 TABLET BY MOUTH EVERY MORNING FOR DIABETES.  Marland Kitchen lisinopril (ZESTRIL) 5 MG tablet TAKE 1 TABLET (5 MG TOTAL) BY MOUTH DAILY. FOR BLOOD PRESSURE AND KIDNEY PROTECTION.  . metFORMIN (GLUCOPHAGE) 500 MG tablet TAKE 1 TABLET BY MOUTH TWICE DAILY WITH A MEAL  . metoprolol succinate (TOPROL-XL) 25 MG 24 hr tablet Take 1 tablet (25 mg total) by mouth daily. Please make annual appt with [name] for refills. [phone]. 1st attempt.  . nitroGLYCERIN (NITROSTAT) 0.4 MG SL tablet Place 1 tablet (0.4 mg total) under the tongue every 5 (five) minutes as needed for chest pain.  . pantoprazole (PROTONIX) 40 MG tablet TAKE 1 TABLET (40 MG TOTAL) BY MOUTH DAILY. FOR HEARTBURN.  . rosuvastatin (CRESTOR) 20 MG tablet TAKE 1 TABLET BY MOUTH EVERY DAY IN THE EVENING FOR CHOLESTEROL  . sitaGLIPtin (JANUVIA) 100 MG tablet TAKE 1 TABLET BY MOUTH ONCE DAILY FOR DIABETES.  . tamsulosin (FLOMAX) 0.4 MG CAPS capsule TAKE 1 CAPSULE (0.4 MG TOTAL) BY MOUTH 2 (TWO) TIMES DAILY.  Marland Kitchen warfarin (COUMADIN) 5 MG tablet Take 5 mg on Monday, take 2.5 mg every day except for Monday.   No facility-administered encounter medications on file as of 05/15/2019.

## 2019-05-22 ENCOUNTER — Other Ambulatory Visit: Payer: Self-pay | Admitting: Internal Medicine

## 2019-05-26 NOTE — Progress Notes (Signed)
Cardiology Office Note Date:  05/28/2019  Patient ID:  [name], [name] 20-[name]-1951, MRN [id number] PCP:  [name], NP  Cardiologist:  [name]    Chief Complaint: over due visit  History of Present Illness: [name] [name] is a 69 y.o. male with history of AFlutter (ablated) >> persistent AFib >> permanent uncontrolled AFib >> AVNode ablation/PPM, HTN, HLD, DM, COPD, chronic combined CHF, CAD (NSTEMI 2014 w/occ branch of Diag treated medically >> 2016 unstable angina had DES to RCA).   He comes in today to be seen for [name].  Last seen by him jan 2020, he was admitted to [facility] w/CP feb with stable CAD by cath.  He initially reports he feels well and has no complaints or symptoms of concern to him, though the longer we talk he mentions a CP that he describes as a pinch, located to a couple different spots on his chest, inferior to L breast ad high sternum, no associated symptoms, no radiation, and random, not associated with exertion or position, duration is hard to nail down, maybe a couple minutes, just self resolves, he does not use s/l NTG for it.  Infrequent and thinks he has had it on/off for a year or so. He also describes DOE.  He reports able to do work around the house, he gives an example of currently cleaning out his shed and lifting/carrying things, organizing without symptoms, but if he walks an incline he gets very winded has to stop to regain himself.  He will sometimes get winded walking from the car to the house, though this is much less and not typical for him, can do the same walk and more without symptoms as well. This symptoms has been about the same for a couple years  He denies any Palpitations, no near syncope or syncope.  He reports his PMD generally does labs Q 32mo for lipids and routine labs. 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 Arthritis    "knees and lower back" (03/14/2013)  . Atrial flutter ([place])    radiofrequency ablation in 2001  . CAD (coronary artery disease)    a. Nonobstructive. Cardiac cath in 2001-50% mid RI, normal LM, LAD, RCA b. cath 10/16/2014 95% mid RCA treated with DES, 99% ost D1 medical management due to small aneurysmal segment  . Chronic anticoagulation    chronic Coumadin anticoagulation  . Chronic obstructive pulmonary disease ([place]) 04/20/2011  . Diabetes mellitus, type 2 ([place])   . GERD (gastroesophageal reflux disease)   . Hyperlipidemia   . Hypertension    with hypertensive heart disease  . Obesity   . Persistent atrial fibrillation (HCC)    recurrent atrial flutter since 2001 s/p DCCVs, multiple failed AADs, h/o tachy-mediated cardiomyopathy  . Shortness of breath    "can come on at any time" (03/14/2013)  . Sleep apnea    "dx'd; couldn't wear the mask" (03/14/2013)  . Tobacco abuse     Past Surgical History:  Procedure Laterality Date  . ATRIAL FLUTTER ABLATION  2002   atrial flutter; subsequently developed atrial fibrillation  . AV NODE ABLATION  01/24/2013  . CARDIAC CATHETERIZATION  2002  .  CARDIAC CATHETERIZATION N/A 10/16/2014   Procedure: Left Heart Cath and Coronary Angiography;  Surgeon: [name], MD;  Location: [facility] CV LAB;  Service: Cardiovascular;  Laterality: N/A;  . CARDIOVERSION  05/31/2011   Procedure: CARDIOVERSION;  Surgeon: Cristopher Estimable. Lattie Haw, MD;  Location: [facility];  Service: Cardiovascular;  Laterality: N/A;  . CARPAL TUNNEL RELEASE Left 1980's  . COLONOSCOPY WITH PROPOFOL N/A 12/30/2018   Procedure: COLONOSCOPY WITH PROPOFOL;  Surgeon: Jonathon Bellows, MD;  Location[facility] ENDOSCOPY;  Service: Gastroenterology;  Laterality: N/A;  . INSERT / REPLACE / REMOVE PACEMAKER   01/24/2013    Medtronic Adapta L dual-chamber pacemaker, serial number NWE [id number]   . LEFT HEART CATH AND CORONARY ANGIOGRAPHY N/A 06/07/2018   Procedure: LEFT HEART CATH AND CORONARY ANGIOGRAPHY;  Surgeon: [name], MD;  Location: [facility] LAB;  Service: Cardiovascular;  Laterality: N/A;  . LEFT HEART CATHETERIZATION WITH CORONARY ANGIOGRAM N/A 03/17/2013   Procedure: LEFT HEART CATHETERIZATION WITH CORONARY ANGIOGRAM;  Surgeon: Burnell Blanks, MD; LAD mild dz, D1 branch 100%, inferior branch 99%, CFX OK, RCA 50%, EF 65%    . LOOP RECORDER IMPLANT  2002  . PERMANENT PACEMAKER INSERTION N/A 01/24/2013   Procedure: PERMANENT PACEMAKER INSERTION;  Surgeon: [name], MD;  Location: [facility] CATH LAB;  Service: Cardiovascular;  Laterality: N/A;  . TIBIAL TUBERCLERPLASTY  ~ 2003    Current Outpatient Medications  Medication Sig Dispense Refill  . buPROPion (ZYBAN) 150 MG 12 hr tablet TAKE 1 TABLET BY MOUTH TWICE A DAY 180 tablet 1  . clopidogrel (PLAVIX) 75 MG tablet TAKE 1 TABLET BY MOUTH EVERY DAY 90 tablet 0  . escitalopram (LEXAPRO) 20 MG tablet Take 1 tablet (20 mg total) by mouth daily. For depression. 90 tablet 1  . fish oil-omega-3 fatty acids 1000 MG capsule Take 1 g by mouth daily.      . fluticasone (FLONASE) 50 MCG/ACT nasal spray SMARTSIG:1 Spray(s) Both Nares Daily PRN    . furosemide (LASIX) 20 MG tablet TAKE 1 TABLET BY MOUTH EVERY DAY 90 tablet 2  . gabapentin (NEURONTIN) 300 MG capsule TAKE 1 CAPSULE BY MOUTH THREE TIMES DAILY FOR DIABETIC NEUROPATHY. 270 capsule 1  . glucose blood (ONE TOUCH ULTRA TEST) test strip USE TO TEST UP TO 4 TIMES DAILY 100 each 2  . guaiFENesin-codeine 100-10 MG/5ML syrup Take 5 mLs by mouth 3 (three) times daily as needed for cough. 120 mL 0  . isosorbide mononitrate (IMDUR) 60 MG 24 hr tablet Take 1 tablet (60 mg total) by mouth daily. 90 tablet 1  . JARDIANCE 10 MG TABS tablet TAKE 1 TABLET BY MOUTH EVERY MORNING FOR DIABETES. 90  tablet 1  . lisinopril (ZESTRIL) 2.5 MG tablet Take 1 tablet (2.5 mg total) by mouth daily. For blood pressure and kidney protection. 90 tablet 1  . metFORMIN (GLUCOPHAGE) 500 MG tablet TAKE 1 TABLET BY MOUTH TWICE DAILY WITH A MEAL 180 tablet 1  . metoprolol succinate (TOPROL-XL) 25 MG 24 hr tablet Take 1 tablet (25 mg total) by mouth daily. 30 tablet 0  . nitroGLYCERIN (NITROSTAT) 0.4 MG SL tablet Place 1 tablet (0.4 mg total) under the tongue every 5 (five) minutes as needed for chest pain. 25 tablet 0  . pantoprazole (PROTONIX) 40 MG tablet TAKE 1 TABLET (40 MG TOTAL) BY MOUTH DAILY. FOR HEARTBURN. 90 tablet 1  . rosuvastatin (CRESTOR) 20 MG tablet TAKE 1 TABLET BY MOUTH EVERY DAY IN THE EVENING FOR CHOLESTEROL 90  tablet 1  . sitaGLIPtin (JANUVIA) 100 MG tablet TAKE 1 TABLET BY MOUTH ONCE DAILY FOR DIABETES. 90 tablet 1  . tamsulosin (FLOMAX) 0.4 MG CAPS capsule TAKE 1 CAPSULE (0.4 MG TOTAL) BY MOUTH 2 (TWO) TIMES DAILY. 180 capsule 3  . warfarin (COUMADIN) 5 MG tablet Take 5 mg on Monday, take 2.5 mg every day except for Monday. 90 tablet 0   No current facility-administered medications for this visit.    Allergies:   Patient has no known allergies.   Social History:  The patient  reports that he quit smoking about 7 years ago. His smoking use included cigarettes. He has a 42.00 pack-year smoking history. He has never used smokeless tobacco. He reports previous alcohol use. He reports that he does not use drugs.   Family History:  The patient's family history includes Alzheimer's disease in his mother; Osteoporosis in his mother.  ROS:  Please see the history of present illness.  All other systems are reviewed and otherwise negative.   PHYSICAL EXAM:  VS:  BP 110/74   Pulse 83   Ht 5\' 11"  (1.803 m)   Wt 231 lb (104.8 kg)   BMI 32.22 kg/m  BMI: Body mass index is 32.22 kg/m. Well nourished, well developed, in no acute distress  HEENT: normocephalic, atraumatic  Neck: no JVD,  carotid bruits or masses Cardiac:  RRR; (paced) no significant murmurs, no rubs, or gallops Lungs:  CTA b/l, no wheezing, rhonchi or rales  Abd: soft, nontender, obese MS: no deformity or atrophy Ext: no edema  Skin: warm and dry, no rash Neuro:  No gross deficits appreciated Psych: euthymic mood, full affect  PPM site is stable, no tethering or discomfort   EKG:  Done today and reviewed by myself shows  AFib/V paced   PPM interrogation done today and reviewed by myself:  battery and lead measurements are good Device dependent at 40bpm No device observations HR variability looks OK    Cardiac catheterization 06/07/2018 LM irregularities LAD mild diffuse disease; D1 ostial 99, superior branch occluded/larger inferior branch 99 -  unchanged from 2014 and 2016 LCx mild diffuse disease RCA proximal 40, mid stent patent, 30   Echocardiogram 06/06/2018 EF 45-50, mild LVH, indeterminate diastolic function, anterior septal and inferior akinesis, normal RVSF, moderate LAE, mild aortic valve calcification  Echocardiogram 10/16/2014 Moderate LVH, anterior and periapical hypokinesis, EF 35-40, trivial MR, mild LAE, mild RAE    Recent Labs: 06/05/2018: Magnesium 1.8 06/13/2018: ALT 17 06/27/2018: BUN 20; Creatinine, Ser 1.16; Potassium 3.7; Sodium 139 08/14/2018: Hemoglobin 14.3; Platelets 136 04/15/2019: TSH 2.030  06/06/2018: Cholesterol 130; HDL 42; LDL Cholesterol 71; Total CHOL/HDL Ratio 3.1; Triglycerides 87; VLDL 17   CrCl cannot be calculated (Patient's most recent lab result is older than the maximum 21 days allowed.).   Wt Readings from Last 3 Encounters:  05/28/19 231 lb (104.8 kg)  05/15/19 235 lb 8 oz (106.8 kg)  04/15/19 230 lb 1.6 oz (104.4 kg)     Other studies reviewed: Additional studies/records reviewed today include: summarized above  ASSESSMENT AND PLAN:  1. PPM    Intact function, no changes made  2. Permanent Afib     S/p AV node ablation      CHA2DS2Vasc is, 5 on warfarin, monitored and managed by his PMD  3. CAD     CP sounds atypical, he has known branch vessel disease and patent stent by cath last year 4. Chronic CHF (combined)     Exam  does not suggest volume OL     His DOE is unchanged for a couple years, ? If anginal equivalent, ? Deconditioning     Given his CAD, will increase his imdur and decrease his lisinopril with reports by the patient of his BP tending to get low Encouraged to walk at a mild pace that does not cause symptoms to establish some exercise and cardiac conditioning  5. HTN     Looks OK, changes as above  6. HLD     Labs today   Disposition: BMET, CBC, LFTs and lipids today, med changes as above, will see him back in 2 mo,s ooner if needed    Current medicines are reviewed at length with the patient today.  The patient did not have any concerns regarding medicines.  Venetia Night, PA-C 05/28/2019 11:54 AM     [address] [place] [address] [phone] (office)  [phone] (fax)

## 2019-05-27 ENCOUNTER — Ambulatory Visit: Payer: Medicare Other | Admitting: Urology

## 2019-05-27 ENCOUNTER — Telehealth: Payer: Self-pay | Admitting: Urology

## 2019-05-27 NOTE — Telephone Encounter (Signed)
This patient was a no-show for his visit today.  Please ensure that this is rescheduled that he gets a PSA at minimum.  He  Hollice Espy, MD

## 2019-05-27 NOTE — Telephone Encounter (Signed)
Patient informed, Lab appointment scheduled and follow up scheduled with Dr. Erlene Quan. Mailed appointment reminder as requested.

## 2019-05-28 ENCOUNTER — Ambulatory Visit (INDEPENDENT_AMBULATORY_CARE_PROVIDER_SITE_OTHER): Payer: Medicare Other | Admitting: Physician Assistant

## 2019-05-28 ENCOUNTER — Other Ambulatory Visit: Payer: Self-pay

## 2019-05-28 VITALS — BP 110/74 | HR 83 | Ht 71.0 in | Wt 231.0 lb

## 2019-05-28 DIAGNOSIS — I10 Essential (primary) hypertension: Secondary | ICD-10-CM

## 2019-05-28 DIAGNOSIS — R0789 Other chest pain: Secondary | ICD-10-CM

## 2019-05-28 DIAGNOSIS — I4821 Permanent atrial fibrillation: Secondary | ICD-10-CM | POA: Diagnosis not present

## 2019-05-28 DIAGNOSIS — Z950 Presence of cardiac pacemaker: Secondary | ICD-10-CM | POA: Diagnosis not present

## 2019-05-28 DIAGNOSIS — I4819 Other persistent atrial fibrillation: Secondary | ICD-10-CM | POA: Diagnosis not present

## 2019-05-28 DIAGNOSIS — I2510 Atherosclerotic heart disease of native coronary artery without angina pectoris: Secondary | ICD-10-CM | POA: Diagnosis not present

## 2019-05-28 DIAGNOSIS — Z79899 Other long term (current) drug therapy: Secondary | ICD-10-CM

## 2019-05-28 DIAGNOSIS — E7849 Other hyperlipidemia: Secondary | ICD-10-CM

## 2019-05-28 DIAGNOSIS — R0600 Dyspnea, unspecified: Secondary | ICD-10-CM

## 2019-05-28 DIAGNOSIS — R0609 Other forms of dyspnea: Secondary | ICD-10-CM

## 2019-05-28 MED ORDER — ISOSORBIDE MONONITRATE ER 60 MG PO TB24: 60.0000 mg | ORAL_TABLET | Freq: Every day | ORAL | 1 refills | Status: DC

## 2019-05-28 MED ORDER — LISINOPRIL 2.5 MG PO TABS: 2.5000 mg | ORAL_TABLET | Freq: Every day | ORAL | 1 refills | Status: DC

## 2019-05-28 NOTE — Patient Instructions (Addendum)
Medication Instructions:    START TAKING  1.  IMDUR 60 MG ONCE A DAY   2. LISINOPRIL 2.5 MG ONCE  DAY   *If you need a refill on your cardiac medications before your next appointment, please call your pharmacy*  Lab Work:  BMET  LFT LIPIDS  CBC  TODAY    If you have labs (blood work) drawn today and your tests are completely normal, you will receive your results only by: Marland Kitchen MyChart Message (if you have MyChart) OR . A paper copy in the mail If you have any lab test that is abnormal or we need to change your treatment, we will call you to review the results.  Testing/Procedures:  NONE ORDERED  TODAY   Follow-Up: At [facility], you and your health needs are our priority.  As part of our continuing mission to provide you with exceptional heart care, we have created designated Provider Care Teams.  These Care Teams include your primary Cardiologist (physician) and Advanced Practice Providers (APPs -  Physician Assistants and Nurse Practitioners) who all work together to provide you with the care you need, when you need it.  Your next appointment:   2  Months   The format for your next appointment:    In Person  Provider:   You may see  the following Advanced Practice Providers on your designated Care Team:  Tommye Standard, [place]    Other Instructions

## 2019-05-29 ENCOUNTER — Other Ambulatory Visit: Payer: Self-pay | Admitting: Family Medicine

## 2019-05-29 LAB — BASIC METABOLIC PANEL
BUN/Creatinine Ratio: 15 (ref 10–24)
BUN: 21 mg/dL (ref 8–27)
CO2: 23 mmol/L (ref 20–29)
Calcium: 9.8 mg/dL (ref 8.6–10.2)
Chloride: 100 mmol/L (ref 96–106)
Creatinine, Ser: 1.36 mg/dL — ABNORMAL HIGH (ref 0.76–1.27)
GFR calc Af Amer: 62 mL/min/{1.73_m2} (ref >59)
GFR calc non Af Amer: 53 mL/min/{1.73_m2} — ABNORMAL LOW (ref >59)
Glucose: 143 mg/dL — ABNORMAL HIGH (ref 65–99)
Potassium: 4.5 mmol/L (ref 3.5–5.2)
Sodium: 140 mmol/L (ref 134–144)

## 2019-05-29 LAB — HEPATIC FUNCTION PANEL
ALT: 19 IU/L (ref 0–44)
AST: 23 IU/L (ref 0–40)
Albumin: 4.4 g/dL (ref 3.8–4.8)
Alkaline Phosphatase: 67 IU/L (ref 39–117)
Bilirubin Total: 0.4 mg/dL (ref 0.0–1.2)
Bilirubin, Direct: 0.12 mg/dL (ref 0.00–0.40)
Total Protein: 6.3 g/dL (ref 6.0–8.5)

## 2019-05-29 LAB — CBC
Hematocrit: 45.4 % (ref 37.5–51.0)
Hemoglobin: 15.4 g/dL (ref 13.0–17.7)
MCH: 32.4 pg (ref 26.6–33.0)
MCHC: 33.9 g/dL (ref 31.5–35.7)
MCV: 96 fL (ref 79–97)
Platelets: 138 10*3/uL — ABNORMAL LOW (ref 150–450)
RBC: 4.75 x10E6/uL (ref 4.14–5.80)
RDW: 13.1 % (ref 11.6–15.4)
WBC: 6.2 10*3/uL (ref 3.4–10.8)

## 2019-05-29 LAB — LIPID PANEL
Chol/HDL Ratio: 2.7 ratio (ref 0.0–5.0)
Cholesterol, Total: 147 mg/dL (ref 100–199)
HDL: 54 mg/dL (ref >39)
LDL Chol Calc (NIH): 69 mg/dL (ref 0–99)
Triglycerides: 139 mg/dL (ref 0–149)
VLDL Cholesterol Cal: 24 mg/dL (ref 5–40)

## 2019-06-02 ENCOUNTER — Other Ambulatory Visit: Payer: Medicare Other

## 2019-06-03 ENCOUNTER — Other Ambulatory Visit: Payer: Self-pay | Admitting: Primary Care

## 2019-06-03 ENCOUNTER — Telehealth: Payer: Self-pay | Admitting: *Deleted

## 2019-06-03 DIAGNOSIS — M1711 Unilateral primary osteoarthritis, right knee: Secondary | ICD-10-CM

## 2019-06-03 NOTE — Telephone Encounter (Signed)
Left VM patient needs to reschedule appointment-needs to have PSA done.

## 2019-06-04 ENCOUNTER — Telehealth: Payer: Self-pay | Admitting: Physical Therapy

## 2019-06-04 ENCOUNTER — Ambulatory Visit: Payer: Medicare Other | Admitting: Urology

## 2019-06-04 NOTE — Telephone Encounter (Signed)
Called and spoke with patient regarding therapy referral received/per facility Covid screening policy to see if wishing to attend in person vs. Telehealth. He reports plans on attending therapy at another facility closer to home so will not be scheduled for eval at the facility at this time.

## 2019-06-06 ENCOUNTER — Other Ambulatory Visit: Payer: Self-pay | Admitting: Nurse Practitioner

## 2019-06-06 DIAGNOSIS — Z79899 Other long term (current) drug therapy: Secondary | ICD-10-CM

## 2019-06-06 DIAGNOSIS — I10 Essential (primary) hypertension: Secondary | ICD-10-CM

## 2019-06-06 DIAGNOSIS — Z950 Presence of cardiac pacemaker: Secondary | ICD-10-CM

## 2019-06-06 DIAGNOSIS — I4821 Permanent atrial fibrillation: Secondary | ICD-10-CM

## 2019-06-06 DIAGNOSIS — I495 Sick sinus syndrome: Secondary | ICD-10-CM

## 2019-06-07 DIAGNOSIS — S93401A Sprain of unspecified ligament of right ankle, initial encounter: Secondary | ICD-10-CM | POA: Diagnosis not present

## 2019-06-10 ENCOUNTER — Other Ambulatory Visit: Payer: Medicare Other

## 2019-06-10 ENCOUNTER — Other Ambulatory Visit: Payer: Self-pay

## 2019-06-10 DIAGNOSIS — C61 Malignant neoplasm of prostate: Secondary | ICD-10-CM

## 2019-06-11 LAB — PSA: Prostate Specific Ag, Serum: <0.1 ng/mL (ref 0.0–4.0)

## 2019-06-13 ENCOUNTER — Telehealth: Payer: Self-pay | Admitting: *Deleted

## 2019-06-13 DIAGNOSIS — C61 Malignant neoplasm of prostate: Secondary | ICD-10-CM

## 2019-06-13 NOTE — Telephone Encounter (Addendum)
Left VM asked to return my call to discuss options.    ----- Message from Hollice Espy, MD sent at 06/13/2019  2:04 PM EST ----- PSA remains undetectable which is great.  I see that he missed an appointment with me.  If he has no issues he wants to discuss, I am fine seeing him next year.  It looks like he has an appointment with Dr. Baruch Gouty between now and then which should suffice for his cancer care.  Please help him schedule.  He will need a PSA prior to his appointment next year.  Hollice Espy, MD

## 2019-06-14 ENCOUNTER — Other Ambulatory Visit: Payer: Self-pay | Admitting: Internal Medicine

## 2019-06-16 ENCOUNTER — Other Ambulatory Visit: Payer: Self-pay | Admitting: Primary Care

## 2019-06-16 DIAGNOSIS — F339 Major depressive disorder, recurrent, unspecified: Secondary | ICD-10-CM

## 2019-06-16 NOTE — Telephone Encounter (Signed)
Pt calls office to return call, he states that he has no issues he wishes to discuss at this time. Pt scheduled for next year, PSA ordered, lab appt scheduled. Pt voiced understanding.

## 2019-06-17 NOTE — Telephone Encounter (Signed)
Patient called office wanted to discuss being seen for ED and options for testosterone. Has seen Larene Beach in the past, scheduled follow up to discuss further. Patient aware of time.

## 2019-06-18 ENCOUNTER — Encounter: Payer: Self-pay | Admitting: Urology

## 2019-06-18 ENCOUNTER — Ambulatory Visit (INDEPENDENT_AMBULATORY_CARE_PROVIDER_SITE_OTHER): Payer: Medicare Other | Admitting: Urology

## 2019-06-18 ENCOUNTER — Other Ambulatory Visit: Payer: Self-pay

## 2019-06-18 VITALS — BP 117/78 | HR 91 | Ht 71.0 in | Wt 234.0 lb

## 2019-06-18 DIAGNOSIS — N529 Male erectile dysfunction, unspecified: Secondary | ICD-10-CM | POA: Diagnosis not present

## 2019-06-18 DIAGNOSIS — C61 Malignant neoplasm of prostate: Secondary | ICD-10-CM

## 2019-06-18 NOTE — Progress Notes (Signed)
06/18/2019 1:17 PM   [name] Cook 09-23-1950 [id number]  Referring provider: [name], NP [place],  [place] [address]  Chief Complaint  Patient presents with  . Erectile Dysfunction    HPI: [name] Cook is a 69 y.o. male with personal history of prostate cancer and ED who presents today to discuss treatment for ED.    Prostate cancer Prostate biopsy 04/2018 revealed 6/12 cores, all on the right including 3 cores of Gleason 3+3 (up to 99%) and 3 cores of 3+4 up to 96%.  PSA 6.1.  Rectal exam was abnormal with a nodule at the right apex and firm on the side.  TRUS vol 50 with small median lobe, irregular on right.  Ultimately, he elected treatment with IMRT and short-term Lupron.  He received his dose in 06/2018.  He completed radiation in 08/2018.  His recent PSA (06/10/2019) <0.1 ng/mL.    Erectile dysfunction SHIM score: 6     Previous SHIM score: 10      Main complaint: No erections x several months (since the radiation) Risk factors:  age, pelvic radiation, prostate cancer, DM, HTN, HLD, sleep apnea and CAD.     No painful erections or curvatures with his erections.    No longer having spontaneous erections.  Tried:  Nothing to date.    SHIM    Row Name 06/18/19 0920         SHIM: Over the last 6 months:   How do you rate your confidence that you could get and keep an erection?  Low     When you had erections with sexual stimulation, how often were your erections hard enough for penetration (entering your partner)?  Almost Never or Never     During sexual intercourse, how often were you able to maintain your erection after you had penetrated (entered) your partner?  Almost Never or Never     During sexual intercourse, how difficult was it to maintain your erection to completion of intercourse?  Extremely Difficult     When you attempted sexual intercourse, how often was it satisfactory for you?  Almost Never or Never       SHIM Total Score   SHIM  6         Score: 1-7 Severe ED 8-11 Moderate ED 12-16 Mild-Moderate ED 17-21 Mild ED 22-25 No ED   PMH: Past Medical History:  Diagnosis Date  . Arthritis    "knees and lower back" (03/14/2013)  . Atrial flutter ([facility])    radiofrequency ablation in 2001  . CAD (coronary artery disease)    a. Nonobstructive. Cardiac cath in 2001-50% mid RI, normal LM, LAD, RCA b. cath 10/16/2014 95% mid RCA treated with DES, 99% ost D1 medical management due to small aneurysmal segment  . Chronic anticoagulation    chronic Coumadin anticoagulation  . Chronic obstructive pulmonary disease ([facility]) 04/20/2011  . Diabetes mellitus, type 2 ([facility])   . GERD (gastroesophageal reflux disease)   . Hyperlipidemia   . Hypertension    with hypertensive heart disease  . Obesity   . Persistent atrial fibrillation ([facility])    recurrent atrial flutter since 2001 s/p DCCVs, multiple failed AADs, h/o tachy-mediated cardiomyopathy  . Shortness of breath    "can come on at any time" (03/14/2013)  . Sleep apnea    "dx'd; couldn't wear the mask" (03/14/2013)  . Tobacco abuse     Surgical History: Past Surgical History:  Procedure Laterality  Date  . ATRIAL FLUTTER ABLATION  2002   atrial flutter; subsequently developed atrial fibrillation  . AV NODE ABLATION  01/24/2013  . CARDIAC CATHETERIZATION  2002  . CARDIAC CATHETERIZATION N/A 10/16/2014   Procedure: Left Heart Cath and Coronary Angiography;  Surgeon: [name], MD;  Location: [facility];  Service: Cardiovascular;  Laterality: N/A;  . CARDIOVERSION  05/31/2011   Procedure: CARDIOVERSION;  Surgeon: Cristopher Estimable. Lattie Haw, MD;  Location: [facility];  Service: Cardiovascular;  Laterality: N/A;  . CARPAL TUNNEL RELEASE Left 1980's  . COLONOSCOPY WITH PROPOFOL N/A 12/30/2018   Procedure: COLONOSCOPY WITH PROPOFOL;  Surgeon: Jonathon Bellows, MD;  Location: [facility];  Service: Gastroenterology;  Laterality: N/A;  . INSERT / REPLACE / REMOVE PACEMAKER  01/24/2013     Medtronic Adapta L dual-chamber pacemaker, serial number NWE [id number]   . LEFT HEART CATH AND CORONARY ANGIOGRAPHY N/A 06/07/2018   Procedure: LEFT HEART CATH AND CORONARY ANGIOGRAPHY;  Surgeon: [name], MD;  Location: [facility];  Service: Cardiovascular;  Laterality: N/A;  . LEFT HEART CATHETERIZATION WITH CORONARY ANGIOGRAM N/A 03/17/2013   Procedure: LEFT HEART CATHETERIZATION WITH CORONARY ANGIOGRAM;  Surgeon: Burnell Blanks, MD; LAD mild dz, D1 branch 100%, inferior branch 99%, CFX OK, RCA 50%, EF 65%    . LOOP RECORDER IMPLANT  2002  . PERMANENT PACEMAKER INSERTION N/A 01/24/2013   Procedure: PERMANENT PACEMAKER INSERTION;  Surgeon: [name], MD;  Location: [facility] CATH [facility];  Service: Cardiovascular;  Laterality: N/A;  . TIBIAL TUBERCLERPLASTY  ~ 2003    Home Medications:  Allergies as of 06/18/2019   No Known Allergies     Medication List       Accurate as of June 18, 2019  1:17 PM. If you have any questions, ask your nurse or doctor.        buPROPion 150 MG 12 hr tablet Commonly known as: ZYBAN TAKE 1 TABLET BY MOUTH TWICE A DAY   clopidogrel 75 MG tablet Commonly known as: PLAVIX TAKE 1 TABLET BY MOUTH EVERY DAY   escitalopram 20 MG tablet Commonly known as: LEXAPRO TAKE 1 TABLET (20 MG TOTAL) BY MOUTH DAILY. FOR DEPRESSION.   fish oil-omega-3 fatty acids 1000 MG capsule Take 1 g by mouth daily.   fluticasone 50 MCG/ACT nasal spray Commonly known as: FLONASE SMARTSIG:1 Spray(s) Both Nares Daily PRN   furosemide 20 MG tablet Commonly known as: LASIX TAKE 1 TABLET BY MOUTH EVERY DAY   gabapentin 300 MG capsule Commonly known as: NEURONTIN TAKE 1 CAPSULE BY MOUTH THREE TIMES DAILY FOR DIABETIC NEUROPATHY.   glucose blood test strip Commonly known as: ONE TOUCH ULTRA TEST USE TO TEST UP TO 4 TIMES DAILY   guaiFENesin-codeine 100-10 MG/5ML syrup Take 5 mLs by mouth 3 (three) times daily as needed for cough.   isosorbide mononitrate 60  MG 24 hr tablet Commonly known as: IMDUR Take 1 tablet (60 mg total) by mouth daily.   Jardiance 10 MG Tabs tablet Generic drug: empagliflozin TAKE 1 TABLET BY MOUTH EVERY MORNING FOR DIABETES.   lisinopril 5 MG tablet Commonly known as: ZESTRIL Take 5 mg by mouth daily. What changed: Another medication with the same name was removed. Continue taking this medication, and follow the directions you see here. Changed by: Detra Bores, PA-C   metFORMIN 500 MG tablet Commonly known as: GLUCOPHAGE TAKE 1 TABLET BY MOUTH TWICE DAILY WITH A MEAL   metoprolol succinate 25 MG 24 hr tablet Commonly  known as: TOPROL-XL TAKE 1 TABLET BY MOUTH EVERY DAY   nitroGLYCERIN 0.4 MG SL tablet Commonly known as: NITROSTAT Place 1 tablet (0.4 mg total) under the tongue every 5 (five) minutes as needed for chest pain.   pantoprazole 40 MG tablet Commonly known as: PROTONIX TAKE 1 TABLET (40 MG TOTAL) BY MOUTH DAILY. FOR HEARTBURN.   rosuvastatin 20 MG tablet Commonly known as: CRESTOR TAKE 1 TABLET BY MOUTH EVERY DAY IN THE EVENING FOR CHOLESTEROL   sitaGLIPtin 100 MG tablet Commonly known as: Januvia TAKE 1 TABLET BY MOUTH ONCE DAILY FOR DIABETES.   tamsulosin 0.4 MG Caps capsule Commonly known as: FLOMAX TAKE 1 CAPSULE (0.4 MG TOTAL) BY MOUTH 2 (TWO) TIMES DAILY.   warfarin 5 MG tablet Commonly known as: COUMADIN Take as directed by the anticoagulation clinic. If you are unsure how to take this medication, talk to your nurse or doctor. Original instructions: Take 5 mg on Monday, take 2.5 mg every day except for Monday.       Allergies: No Known Allergies  Family History: Family History  Problem Relation Age of Onset  . Alzheimer's disease Mother   . Osteoporosis Mother     Social History:  reports that he quit smoking about 7 years ago. His smoking use included cigarettes. He has a 42.00 pack-year smoking history. He has never used smokeless tobacco. He reports previous  alcohol use. He reports that he does not use drugs.  ROS: For pertinent review of systems please refer to history of present illness  Physical Exam: BP 117/78   Pulse 91   Ht 5\' 11"  (1.803 m)   Wt 234 lb (106.1 kg)   BMI 32.64 kg/m   Constitutional:  Well nourished. Alert and oriented, No acute distress. HEENT: [place] AT, mask in place.  Trachea midline, no masses. Cardiovascular: No clubbing, cyanosis, or edema. Respiratory: Normal respiratory effort, no increased work of breathing. Neurologic: Grossly intact, no focal deficits, moving all 4 extremities. Psychiatric: Normal mood and affect.  Laboratory Data: Lab Results  Component Value Date   WBC 6.2 05/28/2019   HGB 15.4 05/28/2019   HCT 45.4 05/28/2019   MCV 96 05/28/2019   PLT 138 (L) 05/28/2019    Lab Results  Component Value Date   CREATININE 1.36 (H) 05/28/2019    Lab Results  Component Value Date   PSA 6.10 (H) 09/21/2017   PSA 4.33 (H) 10/27/2015   PSA 4.71 (H) 06/22/2014     Lab Results  Component Value Date   HGBA1C 6.6 (A) 03/31/2019    Assessment & Plan:    1. Erectile dysfunction SHIM score is 6, it is worse Patient was inquiring about PDE 5 inhibitors and I reminded him that the fact that he is on Imdur he is not a candidate for the PDE 5 inhibitors at this time.   We rediscussed intra-urethral suppositories, intracavernous vasoactive drug injection therapy, vacuum constriction device and penile prosthesis implantation and the protocols, pro's and con's of each treatment.  I explained that typically erectile dysfunction treatment is an out of pocket expense for the exception of the penile prosthesis implantation as that is a surgical treatment. At this time, he would like a referral to be evaluated for IPP placement Referral placed for Dr. Link Snuffer at [facility] per patient's request  2. Prostate cancer ([place]) IMRT completed 08/2018 and ADT x 6 months PSA < 0.01 06/10/2019 Has  appointment with Dr. Donella Stade on 07/16/2019  Return for Referred to  [facility] for I PSS .  Zara Council, [facility] [address] [place], [place] [address] [phone]   A total of 20 minutes were spent face-to-face with the patient, greater than 50% was spent in patient education, counseling, and coordination of care regarding his treatment options regarding his erectile dysfunction.

## 2019-06-18 NOTE — Patient Instructions (Signed)
[facility] in [place], [place] Address: [address] [place], [place], [place] [address] Phone: [phone]

## 2019-06-22 ENCOUNTER — Other Ambulatory Visit: Payer: Self-pay | Admitting: Primary Care

## 2019-06-22 DIAGNOSIS — Z7901 Long term (current) use of anticoagulants: Secondary | ICD-10-CM

## 2019-06-22 DIAGNOSIS — I4819 Other persistent atrial fibrillation: Secondary | ICD-10-CM

## 2019-06-23 ENCOUNTER — Other Ambulatory Visit: Payer: Medicare Other | Admitting: *Deleted

## 2019-06-23 ENCOUNTER — Other Ambulatory Visit: Payer: Self-pay

## 2019-06-23 DIAGNOSIS — I10 Essential (primary) hypertension: Secondary | ICD-10-CM

## 2019-06-23 DIAGNOSIS — Z79899 Other long term (current) drug therapy: Secondary | ICD-10-CM

## 2019-06-23 DIAGNOSIS — I495 Sick sinus syndrome: Secondary | ICD-10-CM

## 2019-06-23 DIAGNOSIS — I4821 Permanent atrial fibrillation: Secondary | ICD-10-CM | POA: Diagnosis not present

## 2019-06-23 DIAGNOSIS — C61 Malignant neoplasm of prostate: Secondary | ICD-10-CM

## 2019-06-23 DIAGNOSIS — Z950 Presence of cardiac pacemaker: Secondary | ICD-10-CM

## 2019-06-23 LAB — BASIC METABOLIC PANEL WITH GFR
BUN/Creatinine Ratio: 18 (ref 10–24)
BUN: 22 mg/dL (ref 8–27)
CO2: 23 mmol/L (ref 20–29)
Calcium: 9.2 mg/dL (ref 8.6–10.2)
Chloride: 102 mmol/L (ref 96–106)
Creatinine, Ser: 1.2 mg/dL (ref 0.76–1.27)
GFR calc Af Amer: 72 mL/min/1.73
GFR calc non Af Amer: 62 mL/min/1.73
Glucose: 226 mg/dL — ABNORMAL HIGH (ref 65–99)
Potassium: 4.1 mmol/L (ref 3.5–5.2)
Sodium: 139 mmol/L (ref 134–144)

## 2019-06-23 LAB — PSA: Prostate Specific Ag, Serum: <0.1 ng/mL (ref 0.0–4.0)

## 2019-06-24 ENCOUNTER — Telehealth: Payer: Self-pay

## 2019-06-24 NOTE — Telephone Encounter (Signed)
Pt has apt for coumadin clinic on 3/11 and needs screened for covid.  LVM

## 2019-06-24 NOTE — Telephone Encounter (Signed)
Pt compliant with coumadin management. Sent in refill. 

## 2019-06-26 ENCOUNTER — Other Ambulatory Visit: Payer: Self-pay

## 2019-06-26 ENCOUNTER — Ambulatory Visit (INDEPENDENT_AMBULATORY_CARE_PROVIDER_SITE_OTHER): Payer: Medicare Other

## 2019-06-26 DIAGNOSIS — Z7901 Long term (current) use of anticoagulants: Secondary | ICD-10-CM

## 2019-06-26 LAB — POCT INR: INR: 2.2 (ref 2.0–3.0)

## 2019-06-26 NOTE — Patient Instructions (Addendum)
Pre visit review using our clinic review tool, if applicable. No additional management support is needed unless otherwise documented below in the visit note.  Continue to take 1/2  tablet (2.5 mg) daily, except take 1 tablet (5mg ) on Monday.    Re-check in 6 weeks .

## 2019-06-30 DIAGNOSIS — S93601A Unspecified sprain of right foot, initial encounter: Secondary | ICD-10-CM | POA: Diagnosis not present

## 2019-06-30 DIAGNOSIS — S93401A Sprain of unspecified ligament of right ankle, initial encounter: Secondary | ICD-10-CM | POA: Diagnosis not present

## 2019-07-04 ENCOUNTER — Other Ambulatory Visit: Payer: Self-pay | Admitting: Primary Care

## 2019-07-04 DIAGNOSIS — K219 Gastro-esophageal reflux disease without esophagitis: Secondary | ICD-10-CM

## 2019-07-08 ENCOUNTER — Other Ambulatory Visit: Payer: Self-pay | Admitting: Internal Medicine

## 2019-07-09 ENCOUNTER — Inpatient Hospital Stay: Payer: Medicare Other | Attending: Radiation Oncology

## 2019-07-09 ENCOUNTER — Other Ambulatory Visit: Payer: Self-pay

## 2019-07-09 ENCOUNTER — Other Ambulatory Visit: Payer: Self-pay | Admitting: *Deleted

## 2019-07-09 DIAGNOSIS — C61 Malignant neoplasm of prostate: Secondary | ICD-10-CM | POA: Diagnosis present

## 2019-07-09 DIAGNOSIS — Z923 Personal history of irradiation: Secondary | ICD-10-CM | POA: Diagnosis not present

## 2019-07-09 LAB — PSA: Prostatic Specific Antigen: 0.01 ng/mL (ref 0.00–4.00)

## 2019-07-16 ENCOUNTER — Ambulatory Visit
Admission: RE | Admit: 2019-07-16 | Discharge: 2019-07-16 | Disposition: A | Discharge status: Home / Self Care | Payer: Medicare Other | Source: Ambulatory Visit | Attending: Radiation Oncology | Admitting: Radiation Oncology

## 2019-07-16 ENCOUNTER — Other Ambulatory Visit: Payer: Self-pay

## 2019-07-16 ENCOUNTER — Encounter: Payer: Self-pay | Admitting: Radiation Oncology

## 2019-07-16 VITALS — BP 98/64 | HR 78 | Temp 95.8°F | Resp 16 | Wt 232.0 lb

## 2019-07-16 DIAGNOSIS — N529 Male erectile dysfunction, unspecified: Secondary | ICD-10-CM | POA: Insufficient documentation

## 2019-07-16 DIAGNOSIS — C61 Malignant neoplasm of prostate: Secondary | ICD-10-CM | POA: Diagnosis present

## 2019-07-16 DIAGNOSIS — Z923 Personal history of irradiation: Secondary | ICD-10-CM | POA: Diagnosis not present

## 2019-07-16 NOTE — Progress Notes (Signed)
Radiation Oncology Follow up Note  Name: [name] Cook   Date:   07/16/2019 MRN:  [id number] DOB: 1950-04-27    This 69 y.o. male presents to the clinic today for 69-month follow-up status post IMRT radiation therapy to his prostate for Gleason 7 adenocarcinoma.  REFERRING PROVIDER: [name], NP  HPI: Patient is a 69 year old male now out 10 months having completed IMRT radiation therapy to his prostate for a Gleason 7 (3+4) adenocarcinoma presenting with a PSA of 6.1.  He is seen today in routine follow-up and is doing well.  He specifically denies significant increase in your lower urinary tract symptoms diarrhea.  He has problems with erectile dysfunction has seen urology about that.  His most recent PSA is [id number]  COMPLICATIONS OF TREATMENT: none  FOLLOW UP COMPLIANCE: keeps appointments   PHYSICAL EXAM:  BP 98/64   Pulse 78   Temp (!) 95.8 F (35.4 C) (Tympanic)   Resp 16   Wt 232 lb (105.2 kg)   BMI 32.36 kg/m  Well-developed well-nourished patient in NAD. HEENT reveals PERLA, EOMI, discs not visualized.  Oral cavity is clear. No oral mucosal lesions are identified. Neck is clear without evidence of cervical or supraclavicular adenopathy. Lungs are clear to A&P. Cardiac examination is essentially unremarkable with regular rate and rhythm without murmur rub or thrill. Abdomen is benign with no organomegaly or masses noted. Motor sensory and DTR levels are equal and symmetric in the upper and lower extremities. Cranial nerves II through XII are grossly intact. Proprioception is intact. No peripheral adenopathy or edema is identified. No motor or sensory levels are noted. Crude visual fields are within normal range.  RADIOLOGY RESULTS: No current films to review  PLAN: Present time patient is doing well under excellent biochemical control of his prostate cancer.  I am pleased with his overall progress.  I have asked to see him back in 1 year for follow-up.  I mentioned about  Viagra use although he states that is not possible based on his medication profile.  He will continue to see urology about possible other treatments for his erectile dysfunction.  Patient knows to call sooner with any concerns.  I would like to take this opportunity to thank you for allowing me to participate in the care of your patient.Noreene Filbert, MD

## 2019-07-17 ENCOUNTER — Emergency Department (HOSPITAL_COMMUNITY): Payer: Medicare Other

## 2019-07-17 ENCOUNTER — Emergency Department (HOSPITAL_COMMUNITY)
Admission: EM | Admit: 2019-07-17 | Discharge: 2019-07-17 | Disposition: A | Discharge status: Home / Self Care | Payer: Medicare Other | Attending: Emergency Medicine | Admitting: Emergency Medicine

## 2019-07-17 ENCOUNTER — Encounter (HOSPITAL_COMMUNITY): Payer: Self-pay | Admitting: Emergency Medicine

## 2019-07-17 DIAGNOSIS — S99911A Unspecified injury of right ankle, initial encounter: Secondary | ICD-10-CM | POA: Diagnosis present

## 2019-07-17 DIAGNOSIS — S93401A Sprain of unspecified ligament of right ankle, initial encounter: Secondary | ICD-10-CM | POA: Diagnosis not present

## 2019-07-17 DIAGNOSIS — Z7901 Long term (current) use of anticoagulants: Secondary | ICD-10-CM | POA: Diagnosis not present

## 2019-07-17 DIAGNOSIS — Z7984 Long term (current) use of oral hypoglycemic drugs: Secondary | ICD-10-CM | POA: Insufficient documentation

## 2019-07-17 DIAGNOSIS — Z8546 Personal history of malignant neoplasm of prostate: Secondary | ICD-10-CM | POA: Insufficient documentation

## 2019-07-17 DIAGNOSIS — W1849XA Other slipping, tripping and stumbling without falling, initial encounter: Secondary | ICD-10-CM | POA: Insufficient documentation

## 2019-07-17 DIAGNOSIS — X501XXA Overexertion from prolonged static or awkward postures, initial encounter: Secondary | ICD-10-CM | POA: Insufficient documentation

## 2019-07-17 DIAGNOSIS — I129 Hypertensive chronic kidney disease with stage 1 through stage 4 chronic kidney disease, or unspecified chronic kidney disease: Secondary | ICD-10-CM | POA: Diagnosis not present

## 2019-07-17 DIAGNOSIS — Z87891 Personal history of nicotine dependence: Secondary | ICD-10-CM | POA: Diagnosis not present

## 2019-07-17 DIAGNOSIS — I252 Old myocardial infarction: Secondary | ICD-10-CM | POA: Insufficient documentation

## 2019-07-17 DIAGNOSIS — E1122 Type 2 diabetes mellitus with diabetic chronic kidney disease: Secondary | ICD-10-CM | POA: Diagnosis not present

## 2019-07-17 DIAGNOSIS — Y929 Unspecified place or not applicable: Secondary | ICD-10-CM | POA: Insufficient documentation

## 2019-07-17 DIAGNOSIS — N182 Chronic kidney disease, stage 2 (mild): Secondary | ICD-10-CM | POA: Diagnosis not present

## 2019-07-17 DIAGNOSIS — Y999 Unspecified external cause status: Secondary | ICD-10-CM | POA: Diagnosis not present

## 2019-07-17 DIAGNOSIS — Y93K1 Activity, walking an animal: Secondary | ICD-10-CM | POA: Insufficient documentation

## 2019-07-17 DIAGNOSIS — M25571 Pain in right ankle and joints of right foot: Secondary | ICD-10-CM | POA: Diagnosis not present

## 2019-07-17 MED ORDER — DICLOFENAC SODIUM 1 % EX GEL: 2.0000 g | Freq: Four times a day (QID) | CUTANEOUS | 0 refills | Status: DC

## 2019-07-17 MED ORDER — ACETAMINOPHEN 325 MG PO TABS
650.0000 mg | ORAL_TABLET | Freq: Once | ORAL | Status: AC
  Administered 2019-07-17: 650 mg via ORAL
  Filled 2019-07-17: qty 2

## 2019-07-17 MED ORDER — HYDROCODONE-ACETAMINOPHEN 5-325 MG PO TABS
1.0000 | ORAL_TABLET | Freq: Once | ORAL | Status: DC
  Filled 2019-07-17: qty 1

## 2019-07-17 NOTE — ED Provider Notes (Signed)
[facility] EMERGENCY DEPARTMENT Provider Note   CSN: [id number] Arrival date & time: 07/17/19  0755     History Chief Complaint  Patient presents with  . Joint Swelling    [name] Cook is a 69 y.o. male with a past medical history of hypertension, diabetes, CAD, currently on Plavix and Coumadin presenting to the ED with a chief complaint of right ankle pain for the past 2 weeks.  2 weeks ago was walking his dog when he tripped and twisted his ankle.  He has been having persistent aching pain in his right lateral ankle since then.  He has been taking some Tylenol with minimal improvement in his symptoms.  He denies any head injuries, loss of consciousness or other joint swelling after the fall. Denies any prior fracture, dislocation or procedure in the area. Denies history of gout or septic joint.  HPI     Past Medical History:  Diagnosis Date  . Arthritis    "knees and lower back" (03/14/2013)  . Atrial flutter ([place])    radiofrequency ablation in 2001  . CAD (coronary artery disease)    a. Nonobstructive. Cardiac cath in 2001-50% mid RI, normal LM, LAD, RCA b. cath 10/16/2014 95% mid RCA treated with DES, 99% ost D1 medical management due to small aneurysmal segment  . Chronic anticoagulation    chronic Coumadin anticoagulation  . Chronic obstructive pulmonary disease ([place]) 04/20/2011  . Diabetes mellitus, type 2 ([place])   . GERD (gastroesophageal reflux disease)   . Hyperlipidemia   . Hypertension    with hypertensive heart disease  . Obesity   . Persistent atrial fibrillation (HCC)    recurrent atrial flutter since 2001 s/p DCCVs, multiple failed AADs, h/o tachy-mediated cardiomyopathy  . Shortness of breath    "can come on at any time" (03/14/2013)  . Sleep apnea    "dx'd; couldn't wear the mask" (03/14/2013)  . Tobacco abuse     Patient Active Problem List   Diagnosis Date Noted  . Polyp of colon   . Decreased renal function 06/27/2018  . Dizziness  06/13/2018  . Prostate cancer ([place]) 06/10/2018  . Chest pain 06/04/2018  . Left knee pain 10/25/2017  . OSA (obstructive sleep apnea) 10/01/2017  . Encounter for screening colonoscopy 10/01/2017  . Osteoarthritis 06/26/2017  . Insomnia 10/01/2014  . Depression 03/09/2014  . Elevated PSA 02/09/2014  . Coronary artery disease 11/06/2013  . Long term current use of anticoagulant therapy 06/19/2013  . Pacemaker 04/30/2013  . Acute myocardial infarction, subendocardial infarction, initial episode of care ([place]) 03/15/2013  . Hypokalemia 01/15/2013  . Abnormal LFTs 05/02/2012  . Chronic kidney disease, 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/26/2011  . Chronic obstructive pulmonary disease ([place]) 04/20/2011  . Atrial fibrillation   . Hypertension   . Hyperlipidemia   . Diabetes mellitus type 2, uncomplicated ([place])   . Atrial flutter ([place])   . Tobacco abuse   . GASTROESOPHAGEAL REFLUX DISEASE 05/16/2010    Past Surgical History:  Procedure Laterality Date  . ATRIAL FLUTTER ABLATION  2002   atrial flutter; subsequently developed atrial fibrillation  . AV NODE ABLATION  01/24/2013  . CARDIAC CATHETERIZATION  2002  . CARDIAC CATHETERIZATION N/A 10/16/2014   Procedure: Left Heart Cath and Coronary Angiography;  Surgeon: [name], MD;  Location: [facility] CV LAB;  Service: Cardiovascular;  Laterality: N/A;  . CARDIOVERSION  05/31/2011   Procedure: CARDIOVERSION;  Surgeon: Cristopher Estimable. Lattie Haw, MD;  Location: [facility];  Service: Cardiovascular;  Laterality: N/A;  . CARPAL TUNNEL RELEASE Left 1980's  . COLONOSCOPY WITH PROPOFOL N/A 12/30/2018   Procedure: COLONOSCOPY WITH PROPOFOL;  Surgeon: Jonathon Bellows, MD;  Location[facility] ENDOSCOPY;  Service: Gastroenterology;  Laterality: N/A;  . INSERT / REPLACE / REMOVE PACEMAKER  01/24/2013    Medtronic Adapta L dual-chamber pacemaker, serial number [id number]   . LEFT HEART CATH AND CORONARY ANGIOGRAPHY N/A 06/07/2018   Procedure: LEFT HEART CATH AND CORONARY  ANGIOGRAPHY;  Surgeon: [name], MD;  Location: [facility] CV LAB;  Service: Cardiovascular;  Laterality: N/A;  . LEFT HEART CATHETERIZATION WITH CORONARY ANGIOGRAM N/A 03/17/2013   Procedure: LEFT HEART CATHETERIZATION WITH CORONARY ANGIOGRAM;  Surgeon: Burnell Blanks, MD; LAD mild dz, D1 branch 100%, inferior branch 99%, CFX OK, RCA 50%, EF 65%    . LOOP RECORDER IMPLANT  2002  . PERMANENT PACEMAKER INSERTION N/A 01/24/2013   Procedure: PERMANENT PACEMAKER INSERTION;  Surgeon: [name], MD;  Location: [facility] CATH LAB;  Service: Cardiovascular;  Laterality: N/A;  . TIBIAL TUBERCLERPLASTY  ~ 2003       Family History  Problem Relation Age of Onset  . Alzheimer's disease Mother   . Osteoporosis Mother     Social History   Tobacco Use  . Smoking status: Former Smoker    Packs/day: 1.00    Years: 42.00    Pack years: 42.00    Types: Cigarettes    Quit date: 12/31/2011    Years since quitting: 7.5  . Smokeless tobacco: Never Used  Substance Use Topics  . Alcohol use: Not Currently    Comment: 03/14/2013 "stopped drinking back in 2002; never had problem w/it"  . Drug use: No    Home Medications Prior to Admission medications   Medication Sig Start Date End Date Taking? Authorizing Provider  buPROPion (ZYBAN) 150 MG 12 hr tablet TAKE 1 TABLET BY MOUTH TWICE A DAY 04/07/19   [name], NP  clopidogrel (PLAVIX) 75 MG tablet TAKE 1 TABLET BY MOUTH EVERY DAY 03/25/19   [name], MD  diclofenac Sodium (VOLTAREN) 1 % GEL Apply 2 g topically 4 (four) times daily. 07/17/19   Ronal Maybury, PA-C  escitalopram (LEXAPRO) 20 MG tablet TAKE 1 TABLET (20 MG TOTAL) BY MOUTH DAILY. FOR DEPRESSION. 06/16/19   [name], NP  fish oil-omega-3 fatty acids 1000 MG capsule Take 1 g by mouth daily.      [provider]  fluticasone Asencion Islam) 50 MCG/ACT nasal spray SMARTSIG:1 Spray(s) Both Nares Daily PRN 02/23/19   [provider]  furosemide (LASIX)  20 MG tablet TAKE 1 TABLET BY MOUTH EVERY DAY 11/20/18   [name], MD  gabapentin (NEURONTIN) 300 MG capsule TAKE 1 CAPSULE BY MOUTH THREE TIMES DAILY FOR DIABETIC NEUROPATHY. 03/03/19   [name], NP  glucose blood (ONE TOUCH ULTRA TEST) test strip USE TO TEST UP TO 4 TIMES DAILY 10/23/17   [name], NP  guaiFENesin-codeine 100-10 MG/5ML syrup Take 5 mLs by mouth 3 (three) times daily as needed for cough. 03/17/19   Lesleigh Noe, MD  isosorbide mononitrate (IMDUR) 60 MG 24 hr tablet Take 1 tablet (60 mg total) by mouth daily. 05/28/19   Baldwin Jamaica, PA-C  JARDIANCE 10 MG TABS tablet TAKE 1 TABLET BY MOUTH EVERY MORNING FOR DIABETES. 05/02/19   [name], NP  lisinopril (ZESTRIL) 5 MG tablet Take 5 mg by mouth daily. 06/06/19   [provider]  metFORMIN (GLUCOPHAGE) 500 MG tablet TAKE 1 TABLET BY MOUTH TWICE DAILY WITH A MEAL 01/02/19   Stanton, Modena Nunnery, MD  metoprolol succinate (TOPROL-XL) 25 MG 24 hr tablet TAKE 1 TABLET BY MOUTH EVERY DAY 07/08/19   [name], MD  nitroGLYCERIN (NITROSTAT) 0.4 MG SL tablet Place 1 tablet (0.4 mg total) under the tongue every 5 (five) minutes as needed for chest pain. 01/23/18   [name], NP  pantoprazole (PROTONIX) 40 MG tablet TAKE 1 TABLET (40 MG TOTAL) BY MOUTH DAILY FOR HEARTBURN. 07/04/19   [name], NP  rosuvastatin (CRESTOR) 20 MG tablet TAKE 1 TABLET BY MOUTH EVERY DAY IN THE EVENING FOR CHOLESTEROL 04/29/19   [name], NP  sitaGLIPtin (JANUVIA) 100 MG tablet TAKE 1 TABLET BY MOUTH ONCE DAILY FOR DIABETES. 04/07/19   [name], NP  tamsulosin (FLOMAX) 0.4 MG CAPS capsule TAKE 1 CAPSULE (0.4 MG TOTAL) BY MOUTH 2 (TWO) TIMES DAILY. 01/10/19   Noreene Filbert, MD  warfarin (COUMADIN) 5 MG tablet TAKE 1 TABLET ON MONDAY, TAKE HALF TAB EVERY DAY EXCEPT FOR MONDAY. 06/24/19   [name], NP    Allergies    Patient has no known allergies.  Review of Systems   Review  of Systems  Constitutional: Negative for chills and fever.  Musculoskeletal: Positive for arthralgias and joint swelling. Negative for neck stiffness.  Neurological: Negative for headaches.    Physical Exam Updated Vital Signs BP 105/80 (BP Location: Right Arm)   Pulse 91   Temp (!) 97.4 F (36.3 C) (Oral)   Resp 16   SpO2 98%   Physical Exam Vitals and nursing note reviewed.  Constitutional:      General: He is not in acute distress.    Appearance: He is well-developed. He is not diaphoretic.  HENT:     Head: Normocephalic and atraumatic.  Eyes:     General: No scleral icterus.    Conjunctiva/sclera: Conjunctivae normal.  Pulmonary:     Effort: Pulmonary effort is normal. No respiratory distress.  Musculoskeletal:        General: Swelling and tenderness present. No deformity.     Cervical back: Normal range of motion.     Comments: TTP of the R lateral ankle with minimal swelling noted. Able to perform ROM without difficulty. No erythema or fluctuance of joint. No changes to sensation or wounds. 2+ DP pulse noted.  Skin:    Findings: No rash.  Neurological:     Mental Status: He is alert.     ED Results / Procedures / Treatments   Labs (all labs ordered are listed, but only abnormal results are displayed) Labs Reviewed - No data to display  EKG None  Radiology DG Ankle Complete Right  Result Date: 07/17/2019 CLINICAL DATA:  Right ankle pain for 2 weeks since a twisting injury. Initial encounter. EXAM: RIGHT ANKLE - COMPLETE 3+ VIEW COMPARISON:  None. FINDINGS: There is no evidence of fracture, dislocation, or joint effusion. There is no evidence of arthropathy or other focal bone abnormality. Dorsal and plantar calcaneal spurring noted. Soft tissues are unremarkable. IMPRESSION: No acute abnormality. Electronically Signed   By: Inge Rise M.D.   On: 07/17/2019 08:26    Procedures Procedures (including critical care time)  Medications Ordered in  ED Medications  acetaminophen (TYLENOL) tablet 650 mg (650 mg Oral Given 07/17/19 0849)    ED Course  I have reviewed the triage vital signs and the nursing notes.  Pertinent labs & imaging results that were available during my care of the patient were reviewed by me and considered in my medical decision making (see chart for details).    MDM Rules/Calculators/A&P                      69yo M presents to ED with a chief complaint of R ankle pain after twisting injury 2 weeks ago. Remains ambulatory with pain. Some TTP and swelling of R lateral ankle without significant changes to range of motion. Area is neurovascularly intact. No overlying skin changes or erythema, warmth of joint. Xray is negative for acute abnormality. Suspect strain as the cause of his symptoms. Doubt infectious or vascular cause of symptoms, doubt gout as the cause. Will have him continue Tylenol, take Voltaren gel, RICE therapy and f/u with ortho. Patient requesting hydrocodone, will give one dose here in the ER.  Patient is hemodynamically stable, in NAD. Evaluation does not show pathology that would require ongoing emergent intervention or inpatient treatment. I have personally reviewed and interpreted all lab work and imaging at today's ED visit. I explained the diagnosis to the patient. Pain has been managed and has no complaints prior to discharge. Patient is comfortable with above plan and is stable for discharge at this time. All questions were answered prior to disposition. Strict return precautions for returning to the ED were discussed. Encouraged follow up with PCP.   An After Visit Summary was printed and given to the patient.   Portions of this note were generated with [employer]. Dictation errors may occur despite best attempts at proofreading.  Final Clinical Impression(s) / ED Diagnoses Final diagnoses:  Sprain of right ankle, unspecified ligament, initial encounter    Rx / DC Orders ED  Discharge Orders         Ordered    diclofenac Sodium (VOLTAREN) 1 % GEL  4 times daily     07/17/19 0858           Delia Heady, PA-C 07/17/19 0911    Maudie Flakes, MD 07/21/19 [phone]

## 2019-07-17 NOTE — Discharge Instructions (Addendum)
Use the Voltaren gel as directed to help with pain and swelling. Continue taking Tylenol as needed. Applying ice and elevating the leg will also help with swelling and pain. Follow-up with orthopedic specialist listed below for further evaluation. Return to the ER if you start to experience worsening pain, swelling, redness or warmth of your joint, additional injuries or falls.

## 2019-07-17 NOTE — ED Triage Notes (Signed)
Pt states over 1 week ago he was walking his dog in the yard when he twisted his ankle- pt denies any other injury , pt did not fall or hit his head. Pt is able to walk on his ankle but states the more he walks on it the most painful it becomes.

## 2019-07-21 ENCOUNTER — Other Ambulatory Visit: Payer: Self-pay | Admitting: Primary Care

## 2019-07-22 NOTE — Progress Notes (Signed)
Cardiology Office Note Date:  07/22/2019  Patient ID:  [name] Cook, [name] Cook 1950/10/16, MRN [id number] PCP:  [name], NP  Cardiologist:  [name]    Chief Complaint:  f/u medicine adjustment  History of Present Illness: MAHAD CRILLY is a 69 y.o. male with history of AFlutter (ablated) >> persistent AFib >> permanent uncontrolled AFib >> AVNode ablation/PPM, HTN, HLD, DM, COPD, chronic combined CHF, CAD (NSTEMI 2014 w/occ branch of Diag treated medically >> 2016 unstable angina had DES to RCA).   He comes in today to be seen for [name].  Last seen by him [name] 2020, he was admitted to [facility] w/CP feb with stable CAD by cath.  I saw him 05/28/19: he initially reported feeling well and had no complaints or symptoms of concern to him, though the longer we talked he mentiond a CP that he describes as a pinch, located to a couple different spots on his chest, inferior to L breast ad high sternum, no associated symptoms, no radiation, and random, not associated with exertion or position, duration is hard to nail down, maybe a couple minutes, just self resolves, he does not use s/l NTG for it.  Infrequent and thinks he has had it on/off for a year or so. He also described DOE.  He reported able to do work around the house, he gives an example of currently cleaning out his shed and lifting/carrying things, organizing without symptoms, but if he walks an incline he gets very winded has to stop to regain himself.  He will sometimes get winded walking from the car to the house, though this was much less and not typical for him, can do the same walk and more without symptoms as well. This symptoms had been about the same for a couple years He denied any Palpitations, no near syncope or syncope. He reports his PMD generally does labs Q 69mo for lipids and routine labs. They are managing hs warfarin He denies any bleeding or signs of bleeding  Symptoms felt atypical, though stable major epicardial CAD,  known significant branch vessel disease could not r/o ? Anginal equivalent.  His Imdur was increased, lisinopril reduced.  Recommended to start walking at pace that did not cause symptoms to start some cardiac conditioning.Marland Kitchen  He was not felt to be volume OL, planned for labs and f/u Labs were OK.   He has tolerated the medicine changes, feels like he is doing well. No notable DOE that he noted.  No CP, palpitations.  No dizzy spells, near syncope or syncope.  He has started walking some for exercise, continues to work on his shed, and denies any concerns.  Unfortunately he sufferers with neuropathy particularly his R foot and can not walk to far or too long.  Burning pain to the bottom of his foot, no calf/muscular pain, nothing sounding of claudication He twisted his ankle a couple weeks ago and this has been slow to heal, reports xray done without fracture.  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 Arthritis    "knees and lower back" (03/14/2013)  . Atrial flutter ([place])    radiofrequency ablation in 2001  . CAD (coronary artery disease)    a. Nonobstructive. Cardiac cath in 2001-50% mid RI, normal  LM, LAD, RCA b. cath 10/16/2014 95% mid RCA treated with DES, 99% ost D1 medical management due to small aneurysmal segment  . Chronic anticoagulation    chronic Coumadin anticoagulation  . Chronic obstructive pulmonary disease ([place]) 04/20/2011  . Diabetes mellitus, type 2 ([place])   . GERD (gastroesophageal reflux disease)   . Hyperlipidemia   . Hypertension    with hypertensive heart disease  . Obesity   . Persistent atrial fibrillation (HCC)    recurrent atrial flutter since 2001 s/p DCCVs, multiple failed AADs, h/o tachy-mediated cardiomyopathy  . Shortness of breath      "can come on at any time" (03/14/2013)  . Sleep apnea    "dx'd; couldn't wear the mask" (03/14/2013)  . Tobacco abuse     Past Surgical History:  Procedure Laterality Date  . ATRIAL FLUTTER ABLATION  2002   atrial flutter; subsequently developed atrial fibrillation  . AV NODE ABLATION  01/24/2013  . CARDIAC CATHETERIZATION  2002  . CARDIAC CATHETERIZATION N/A 10/16/2014   Procedure: Left Heart Cath and Coronary Angiography;  Surgeon: [name], MD;  Location: [place] CV LAB;  Service: Cardiovascular;  Laterality: N/A;  . CARDIOVERSION  05/31/2011   Procedure: CARDIOVERSION;  Surgeon: Cristopher Estimable. Lattie Haw, MD;  Location: [facility];  Service: Cardiovascular;  Laterality: N/A;  . CARPAL TUNNEL RELEASE Left 1980's  . COLONOSCOPY WITH PROPOFOL N/A 12/30/2018   Procedure: COLONOSCOPY WITH PROPOFOL;  Surgeon: Jonathon Bellows, MD;  Location: [facility] ENDOSCOPY;  Service: Gastroenterology;  Laterality: N/A;  . INSERT / REPLACE / REMOVE PACEMAKER  01/24/2013    Medtronic Adapta L dual-chamber pacemaker, serial number [id number]   . LEFT HEART CATH AND CORONARY ANGIOGRAPHY N/A 06/07/2018   Procedure: LEFT HEART CATH AND CORONARY ANGIOGRAPHY;  Surgeon: [name], MD;  Location: [place] CV LAB;  Service: Cardiovascular;  Laterality: N/A;  . LEFT HEART CATHETERIZATION WITH CORONARY ANGIOGRAM N/A 03/17/2013   Procedure: LEFT HEART CATHETERIZATION WITH CORONARY ANGIOGRAM;  Surgeon: Burnell Blanks, MD; LAD mild dz, D1 branch 100%, inferior branch 99%, CFX OK, RCA 50%, EF 65%    . LOOP RECORDER IMPLANT  2002  . PERMANENT PACEMAKER INSERTION N/A 01/24/2013   Procedure: PERMANENT PACEMAKER INSERTION;  Surgeon: [name], MD;  [facility] CATH LAB;  Service: Cardiovascular;  Laterality: N/A;  . TIBIAL TUBERCLERPLASTY  ~ 2003    Current Outpatient Medications  Medication Sig Dispense Refill  . buPROPion (ZYBAN) 150 MG 12 hr tablet TAKE 1 TABLET BY MOUTH TWICE A DAY 180 tablet 1  .  clopidogrel (PLAVIX) 75 MG tablet TAKE 1 TABLET BY MOUTH EVERY DAY 90 tablet 0  . diclofenac Sodium (VOLTAREN) 1 % GEL Apply 2 g topically 4 (four) times daily. 50 g 0  . escitalopram (LEXAPRO) 20 MG tablet TAKE 1 TABLET (20 MG TOTAL) BY MOUTH DAILY. FOR DEPRESSION. 90 tablet 1  . fish oil-omega-3 fatty acids 1000 MG capsule Take 1 g by mouth daily.      . fluticasone (FLONASE) 50 MCG/ACT nasal spray SMARTSIG:1 Spray(s) Both Nares Daily PRN    . furosemide (LASIX) 20 MG tablet TAKE 1 TABLET BY MOUTH EVERY DAY 90 tablet 2  . gabapentin (NEURONTIN) 300 MG capsule TAKE 1 CAPSULE BY MOUTH THREE TIMES DAILY FOR DIABETIC NEUROPATHY. 270 capsule 1  . glucose blood (ONE TOUCH ULTRA TEST) test strip USE TO TEST UP TO 4 TIMES DAILY 100 each 2  . guaiFENesin-codeine 100-10 MG/5ML syrup Take 5 mLs by mouth 3 (  three) times daily as needed for cough. 120 mL 0  . isosorbide mononitrate (IMDUR) 60 MG 24 hr tablet Take 1 tablet (60 mg total) by mouth daily. 90 tablet 1  . JARDIANCE 10 MG TABS tablet TAKE 1 TABLET BY MOUTH EVERY MORNING FOR DIABETES. 90 tablet 1  . lisinopril (ZESTRIL) 5 MG tablet Take 5 mg by mouth daily.    . metFORMIN (GLUCOPHAGE) 500 MG tablet TAKE 1 TABLET BY MOUTH TWICE DAILY WITH A MEAL 180 tablet 1  . metoprolol succinate (TOPROL-XL) 25 MG 24 hr tablet TAKE 1 TABLET BY MOUTH EVERY DAY 90 tablet 3  . nitroGLYCERIN (NITROSTAT) 0.4 MG SL tablet Place 1 tablet (0.4 mg total) under the tongue every 5 (five) minutes as needed for chest pain. 25 tablet 0  . pantoprazole (PROTONIX) 40 MG tablet TAKE 1 TABLET (40 MG TOTAL) BY MOUTH DAILY FOR HEARTBURN. 90 tablet 1  . rosuvastatin (CRESTOR) 20 MG tablet TAKE 1 TABLET BY MOUTH EVERY DAY IN THE EVENING FOR CHOLESTEROL 90 tablet 1  . sitaGLIPtin (JANUVIA) 100 MG tablet TAKE 1 TABLET BY MOUTH ONCE DAILY FOR DIABETES. 90 tablet 1  . tamsulosin (FLOMAX) 0.4 MG CAPS capsule TAKE 1 CAPSULE (0.4 MG TOTAL) BY MOUTH 2 (TWO) TIMES DAILY. 180 capsule 3  .  warfarin (COUMADIN) 5 MG tablet TAKE 1 TABLET ON MONDAY, TAKE HALF TAB EVERY DAY EXCEPT FOR MONDAY. 90 tablet 0   No current facility-administered medications for this visit.    Allergies:   Patient has no known allergies.   Social History:  The patient  reports that he quit smoking about 7 years ago. His smoking use included cigarettes. He has a 42.00 pack-year smoking history. He has never used smokeless tobacco. He reports previous alcohol use. He reports that he does not use drugs.   Family History:  The patient's family history includes Alzheimer's disease in his mother; Osteoporosis in his mother.  ROS:  Please see the history of present illness.  All other systems are reviewed and otherwise negative.   PHYSICAL EXAM:  VS:  There were no vitals taken for this visit. BMI: There is no height or weight on file to calculate BMI. Well nourished, well developed, in no acute distress  HEENT: normocephalic, atraumatic  Neck: no JVD, carotid bruits or masses Cardiac:  RRR; (paced) no significant murmurs, no rubs, or gallops Lungs:  CTA b/l, no wheezing, rhonchi or rales  Abd: soft, nontender, obese MS: no deformity or atrophy Ext: no edema he has excellent pedal pulses RLE, no ankles welling, skin changes noted. Skin: warm and dry, no rash Neuro:  No gross deficits appreciated Psych: euthymic mood, full affect  PPM site is stable, no tethering or discomfort   EKG:  Not done today  05/28/19: AFib/V paced   PPM interrogation done today and reviewed by myself:  Battery and lead measurements are good No HVR 100%VP  Cardiac catheterization 06/07/2018 LM irregularities LAD mild diffuse disease; D1 ostial 99, superior branch occluded/larger inferior branch 99 -  unchanged from 2014 and 2016 LCx mild diffuse disease RCA proximal 40, mid stent patent, 30   Echocardiogram 06/06/2018 EF 45-50, mild LVH, indeterminate diastolic function, anterior septal and inferior akinesis, normal  RVSF, moderate LAE, mild aortic valve calcification  Echocardiogram 10/16/2014 Moderate LVH, anterior and periapical hypokinesis, EF 35-40, trivial MR, mild LAE, mild RAE    Recent Labs: 04/15/2019: TSH 2.030 05/28/2019: ALT 19; Hemoglobin 15.4; Platelets 138 06/23/2019: BUN 22; Creatinine, Ser 1.20; Potassium  4.1; Sodium 139  05/28/2019: Chol/HDL Ratio 2.7; Cholesterol, Total 147; HDL 54; LDL Chol Calc (NIH) 69; Triglycerides 139   CrCl cannot be calculated (Patient's most recent lab result is older than the maximum 21 days allowed.).   Wt Readings from Last 3 Encounters:  07/16/19 232 lb (105.2 kg)  06/18/19 234 lb (106.1 kg)  05/28/19 231 lb (104.8 kg)     Other studies reviewed: Additional studies/records reviewed today include: summarized above  ASSESSMENT AND PLAN:  1. PPM     Intact function, no changes made  2. Permanent Afib     S/p AV node ablation     CHA2DS2Vasc is, 5 on warfarin, monitored and managed by his PMD  3. CAD     Not an ongoing complaint, sounded atypical, he has known branch vessel disease and patent stent by cath last year 4. Chronic CHF (combined)     Exam does not suggest volume OL     His DOE is unchanged for a couple years, ? If anginal equivalent, ? Deconditioning     He reports he feels well, today says his neuropathy pain is his limiting factor    5. HTN     Looks OK, no changes   6. HLD     Monitored and managed with his PMD   Disposition: continue remotes Q 3 mo, in clinic in 25mo ,sooner if needed    Current medicines are reviewed at length with the patient today.  The patient did not have any concerns regarding medicines.  Venetia Night, PA-C 07/22/2019 9:32 AM     [place] [address] [phone] (office)  [phone] (fax)

## 2019-07-24 ENCOUNTER — Ambulatory Visit (INDEPENDENT_AMBULATORY_CARE_PROVIDER_SITE_OTHER): Payer: Medicare Other | Admitting: Physician Assistant

## 2019-07-24 ENCOUNTER — Other Ambulatory Visit: Payer: Self-pay

## 2019-07-24 VITALS — BP 116/72 | HR 83 | Ht 71.0 in | Wt 230.0 lb

## 2019-07-24 DIAGNOSIS — I4892 Unspecified atrial flutter: Secondary | ICD-10-CM

## 2019-07-24 DIAGNOSIS — I10 Essential (primary) hypertension: Secondary | ICD-10-CM | POA: Diagnosis not present

## 2019-07-24 DIAGNOSIS — I4821 Permanent atrial fibrillation: Secondary | ICD-10-CM

## 2019-07-24 DIAGNOSIS — Z950 Presence of cardiac pacemaker: Secondary | ICD-10-CM

## 2019-07-24 DIAGNOSIS — I2510 Atherosclerotic heart disease of native coronary artery without angina pectoris: Secondary | ICD-10-CM

## 2019-07-24 DIAGNOSIS — I214 Non-ST elevation (NSTEMI) myocardial infarction: Secondary | ICD-10-CM

## 2019-07-24 DIAGNOSIS — I5042 Chronic combined systolic (congestive) and diastolic (congestive) heart failure: Secondary | ICD-10-CM

## 2019-07-24 NOTE — Patient Instructions (Signed)
Medication Instructions:    Your physician recommends that you continue on your current medications as directed. Please refer to the Current Medication list given to you today.  *If you need a refill on your cardiac medications before your next appointment, please call your pharmacy*   Lab Work: NONE ORDERED  TODAY  If you have labs (blood work) drawn today and your tests are completely normal, you will receive your results only by: . MyChart Message (if you have MyChart) OR . A paper copy in the mail If you have any lab test that is abnormal or we need to change your treatment, we will call you to review the results.   Testing/Procedures: NONE ORDERED  TODAY    Follow-Up: At [facility], you and your health needs are our priority.  As part of our continuing mission to provide you with exceptional heart care, we have created designated Provider Care Teams.  These Care Teams include your primary Cardiologist (physician) and Advanced Practice Providers (APPs -  Physician Assistants and Nurse Practitioners) who all work together to provide you with the care you need, when you need it.  We recommend signing up for the patient portal called "MyChart".  Sign up information is provided on this After Visit Summary.  MyChart is used to connect with patients for Virtual Visits (Telemedicine).  Patients are able to view lab/test results, encounter notes, upcoming appointments, etc.  Non-urgent messages can be sent to your provider as well.   To learn more about what you can do with MyChart, go to [email or web address].    Your next appointment:    6 month(s)  The format for your next appointment:   In Person  Provider:   You may see Dr. Taylor or one of the following Advanced Practice Providers on your designated Care Team:    Amber Seiler, NP  Renee Ursuy, PA-C  Michael "Andy" Tillery, PA-C    Other Instructions   

## 2019-08-07 ENCOUNTER — Ambulatory Visit (INDEPENDENT_AMBULATORY_CARE_PROVIDER_SITE_OTHER): Payer: Medicare Other

## 2019-08-07 ENCOUNTER — Other Ambulatory Visit: Payer: Self-pay

## 2019-08-07 DIAGNOSIS — Z7901 Long term (current) use of anticoagulants: Secondary | ICD-10-CM | POA: Diagnosis not present

## 2019-08-07 LAB — POCT INR: INR: 1.8 — AB (ref 2.0–3.0)

## 2019-08-07 NOTE — Patient Instructions (Addendum)
Pre visit review using our clinic review tool, if applicable. No additional management support is needed unless otherwise documented below in the visit note.  Increase dose today to 5mg then continue to take 1/2  tablet (2.5 mg) daily, except take 1 tablet (5mg) on Monday.    Re-check in 4 weeks .   

## 2019-08-11 ENCOUNTER — Telehealth: Payer: Self-pay

## 2019-08-11 NOTE — Telephone Encounter (Signed)
Unable to speak  with patient to remind of missed remote transmission 

## 2019-08-12 ENCOUNTER — Other Ambulatory Visit: Payer: Self-pay

## 2019-08-12 ENCOUNTER — Encounter: Payer: Self-pay | Admitting: Family Medicine

## 2019-08-12 ENCOUNTER — Ambulatory Visit (INDEPENDENT_AMBULATORY_CARE_PROVIDER_SITE_OTHER): Payer: Medicare Other | Admitting: Family Medicine

## 2019-08-12 DIAGNOSIS — H61893 Other specified disorders of external ear, bilateral: Secondary | ICD-10-CM | POA: Diagnosis not present

## 2019-08-12 DIAGNOSIS — H9201 Otalgia, right ear: Secondary | ICD-10-CM

## 2019-08-12 MED ORDER — NEOMYCIN-POLYMYXIN-HC 3.5-10000-1 OT SOLN: 3.0000 [drp] | Freq: Three times a day (TID) | OTIC | 0 refills | Status: DC

## 2019-08-12 NOTE — Progress Notes (Signed)
Jasyn Mey T. Luciel Brickman, MD, [facility]  Primary Care and Sports Medicine [facility] [place], [address]  Phone: [phone]  FAX: [phone]  [name] Cook - 69 y.o. male  MRN [id number]  Date of Birth: 1950-08-21  Date: 08/13/2019  PCP: [name], NP  Referral: [name], NP  Chief Complaint  Patient presents with  . Ankle Injury    Right-Twisted Ankle x 1 month ago-Seen in ER but not getting any better    This visit occurred during the SARS-CoV-2 public health emergency.  Safety protocols were in place, including screening questions prior to the visit, additional usage of staff PPE, and extensive cleaning of exam room while observing appropriate contact time as indicated for disinfecting solutions.   Subjective:   [name] Cook is a 69 y.o. very pleasant male patient with Body mass index is 31.63 kg/m. who presents with the following:  He is a very nice gentleman who presents with some ongoing right-sided ankle pain.  4 weeks ago he tripped with his dog and twisted his ankle.  He is had some lateral ankle pain after an inversion injury.  He is on Coumadin as well as Plavix chronically.  Plain films dated July 17, 2019 are independently reviewed by myself.  There is no evidence of fracture or dislocation, specifically no ankle fracture is noted. Electronically Signed  By: Owens Loffler, MD On: 08/13/2019  9:40 AM EDT   He was discharged from the ER with some Voltaren gel, recommended Tylenol and rice.  He is here for follow-up regarding this today.  He continues to have pain in the right heel/calcaneus region.  He tells me that this is been the predominant area of pain throughout and initially when he injured his foot and ankle on an inversion injury.  He was given an ASO ankle brace in the emergency room, and this actually made his symptoms worse.  Review of Systems is noted in the HPI, as  appropriate   Objective:   BP 90/60   Pulse 81   Temp 98 F (36.7 C) (Temporal)   Ht 5\' 11"  (1.803 m)   Wt 226 lb 12 oz (102.9 kg)   SpO2 98%   BMI 31.63 kg/m   GEN: No acute distress; alert,appropriate. PULM: Breathing comfortably in no respiratory distress PSYCH: Normally interactive.    He walks with a limp favoring his right side.  Nontender throughout the medial and lateral malleoli.  Nontender throughout the entirety of the forefoot and midfoot.  Nontender at the navicular and cuneiforms as well as calcaneus and at the talus.  ATFL, CFL, and deltoid ligaments are nontender.  Achilles is nontender.  He does have some focal tenderness with compression of the heel.  Nontender at the Achilles and as well as the plantar fascia insertion.  Radiology: DG Ankle Complete Right  Result Date: 07/17/2019 CLINICAL DATA:  Right ankle pain for 2 weeks since a twisting injury. Initial encounter. EXAM: RIGHT ANKLE - COMPLETE 3+ VIEW COMPARISON:  None. FINDINGS: There is no evidence of fracture, dislocation, or joint effusion. There is no evidence of arthropathy or other focal bone abnormality. Dorsal and plantar calcaneal spurring noted. Soft tissues are unremarkable. IMPRESSION: No acute abnormality. Electronically Signed   By: Inge Rise M.D.   On: 07/17/2019 08:26    Assessment and Plan:     ICD-10-CM   1. Pain of right heel  M79.671 DG Os  Calcis Right  2. Acute right ankle pain  M25.571 DG Os Calcis Right   Level of Medical Decision-Making in this case is MODERATE.  Calcaneus pain.  I do not see a cortical disruption or obvious fracture in the patient's plain film of his ankle.  Regardless, his predominant pain that is persistent for almost 4 weeks is at the calcaneus.  This suggests an underlying small occult fracture versus a potential strike injury with some bony edema.  Manage conservatively.  I placed the patient in a short pneumatic fracture boot.  Continue this for 4  weeks.  Work on range of motion while out of boot.  I anticipate that he will do well.  Follow-up: Return in about 1 month (around 09/12/2019).  No orders of the defined types were placed in this encounter.  There are no discontinued medications. Orders Placed This Encounter  Procedures  . DG Os Calcis Right    Signed,  Marchel Foote T. Jesi Jurgens, MD   Outpatient Encounter Medications as of 08/13/2019  Medication Sig  . buPROPion (ZYBAN) 150 MG 12 hr tablet TAKE 1 TABLET BY MOUTH TWICE A DAY  . clopidogrel (PLAVIX) 75 MG tablet TAKE 1 TABLET BY MOUTH EVERY DAY  . diclofenac Sodium (VOLTAREN) 1 % GEL Apply 2 g topically 4 (four) times daily.  Marland Kitchen escitalopram (LEXAPRO) 20 MG tablet TAKE 1 TABLET (20 MG TOTAL) BY MOUTH DAILY. FOR DEPRESSION.  . fish oil-omega-3 fatty acids 1000 MG capsule Take 1 g by mouth daily.    . fluticasone (FLONASE) 50 MCG/ACT nasal spray SMARTSIG:1 Spray(s) Both Nares Daily PRN  . furosemide (LASIX) 20 MG tablet TAKE 1 TABLET BY MOUTH EVERY DAY  . gabapentin (NEURONTIN) 300 MG capsule TAKE 1 CAPSULE BY MOUTH THREE TIMES DAILY FOR DIABETIC NEUROPATHY.  Marland Kitchen glucose blood (ONE TOUCH ULTRA TEST) test strip USE TO TEST UP TO 4 TIMES DAILY  . guaiFENesin-codeine 100-10 MG/5ML syrup Take 5 mLs by mouth 3 (three) times daily as needed for cough.  . isosorbide mononitrate (IMDUR) 60 MG 24 hr tablet Take 1 tablet (60 mg total) by mouth daily.  Marland Kitchen JARDIANCE 10 MG TABS tablet TAKE 1 TABLET BY MOUTH EVERY MORNING FOR DIABETES.  Marland Kitchen lisinopril (ZESTRIL) 5 MG tablet Take 5 mg by mouth daily.  . metFORMIN (GLUCOPHAGE) 500 MG tablet TAKE 1 TABLET BY MOUTH TWICE DAILY WITH A MEAL  . metoprolol succinate (TOPROL-XL) 25 MG 24 hr tablet TAKE 1 TABLET BY MOUTH EVERY DAY  . neomycin-polymyxin-hydrocortisone (CORTISPORIN) OTIC solution Place 3 drops into both ears 3 (three) times daily.  . nitroGLYCERIN (NITROSTAT) 0.4 MG SL tablet Place 1 tablet (0.4 mg total) under the tongue every 5 (five)  minutes as needed for chest pain.  . pantoprazole (PROTONIX) 40 MG tablet TAKE 1 TABLET (40 MG TOTAL) BY MOUTH DAILY FOR HEARTBURN.  . rosuvastatin (CRESTOR) 20 MG tablet TAKE 1 TABLET BY MOUTH EVERY DAY IN THE EVENING FOR CHOLESTEROL  . sitaGLIPtin (JANUVIA) 100 MG tablet TAKE 1 TABLET BY MOUTH ONCE DAILY FOR DIABETES.  . tamsulosin (FLOMAX) 0.4 MG CAPS capsule TAKE 1 CAPSULE (0.4 MG TOTAL) BY MOUTH 2 (TWO) TIMES DAILY.  Marland Kitchen warfarin (COUMADIN) 5 MG tablet TAKE 1 TABLET ON MONDAY, TAKE HALF TAB EVERY DAY EXCEPT FOR MONDAY.   No facility-administered encounter medications on file as of 08/13/2019.

## 2019-08-12 NOTE — Assessment & Plan Note (Signed)
Unsure if adding to R ear pain or abrasion in L ear canal  Trial of cortisporin and then will plan re check

## 2019-08-12 NOTE — Progress Notes (Signed)
Subjective:    Patient ID: [name] Cook, male    DOB: 1950/05/02, 69 y.o.   MRN: [id number]  This visit occurred during the SARS-CoV-2 public health emergency.  Safety protocols were in place, including screening questions prior to the visit, additional usage of staff PPE, and extensive cleaning of exam room while observing appropriate contact time as indicated for disinfecting solutions.    HPI Pt presents with c/o of ear pain  69 yo pt of NP Clark with h/o CAD and DM2   R ear pain for several days  Using flonase   No stuffy or runny nose  No fever   Started a few days ago  Pain radiates down the R side of his neck  Throbbing pain  No headache  No sore throat    No change with jaw movement  Does not grind teeth   No pain in temple Some in front of ear   Has family member in house with ear infection now   No over the counter pain medicine   No dizziness   He does use q tips Has had ears cleaned out in the past   Patient Active Problem List   Diagnosis Date Noted  . Right ear pain 08/12/2019  . Polyp of colon   . Decreased renal function 06/27/2018  . Dizziness 06/13/2018  . Prostate cancer ([place]) 06/10/2018  . Chest pain 06/04/2018  . Left knee pain 10/25/2017  . OSA (obstructive sleep apnea) 10/01/2017  . Encounter for screening colonoscopy 10/01/2017  . Osteoarthritis 06/26/2017  . Insomnia 10/01/2014  . Depression 03/09/2014  . Elevated PSA 02/09/2014  . Coronary artery disease 11/06/2013  . Long term current use of anticoagulant therapy 06/19/2013  . Pacemaker 04/30/2013  . Acute myocardial infarction, subendocardial infarction, initial episode of care ([place]) 03/15/2013  . Hypokalemia 01/15/2013  . Abnormal LFTs 05/02/2012  . Chronic kidney disease, 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/26/2011  . Chronic obstructive pulmonary disease ([place]) 04/20/2011  . Atrial fibrillation   . Hypertension   . Hyperlipidemia   . Diabetes mellitus type 2, uncomplicated ([place])   . Atrial  flutter ([place])   . Tobacco abuse   . GASTROESOPHAGEAL REFLUX DISEASE 05/16/2010   Past Medical History:  Diagnosis Date  . Arthritis    "knees and lower back" (03/14/2013)  . Atrial flutter ([place])    radiofrequency ablation in 2001  . CAD (coronary artery disease)    a. Nonobstructive. Cardiac cath in 2001-50% mid RI, normal LM, LAD, RCA b. cath 10/16/2014 95% mid RCA treated with DES, 99% ost D1 medical management due to small aneurysmal segment  . Chronic anticoagulation    chronic Coumadin anticoagulation  . Chronic obstructive pulmonary disease ([place]) 04/20/2011  . Diabetes mellitus, type 2 ([place])   . GERD (gastroesophageal reflux disease)   . Hyperlipidemia   . Hypertension    with hypertensive heart disease  . Obesity   . Persistent atrial fibrillation ([facility])    recurrent atrial flutter since 2001 s/p DCCVs, multiple failed AADs, h/o tachy-mediated cardiomyopathy  . Shortness of breath    "can come on at any time" (03/14/2013)  . Sleep apnea    "dx'd; couldn't wear the mask" (03/14/2013)  . Tobacco abuse    Past Surgical History:  Procedure Laterality Date  . ATRIAL FLUTTER ABLATION  2002   atrial flutter; subsequently developed atrial fibrillation  . AV NODE ABLATION  01/24/2013  . CARDIAC CATHETERIZATION  2002  . CARDIAC CATHETERIZATION N/A 10/16/2014  Procedure: Left Heart Cath and Coronary Angiography;  Surgeon: [name], MD;  Location: [facility];  Service: Cardiovascular;  Laterality: N/A;  . CARDIOVERSION  05/31/2011   Procedure: CARDIOVERSION;  Surgeon: Cristopher Estimable. Lattie Haw, MD;  Location: [facility];  Service: Cardiovascular;  Laterality: N/A;  . CARPAL TUNNEL RELEASE Left 1980's  . COLONOSCOPY WITH PROPOFOL N/A 12/30/2018   Procedure: COLONOSCOPY WITH PROPOFOL;  Surgeon: Jonathon Bellows, MD;  Location: [facility] ENDOSCOPY;  Service: Gastroenterology;  Laterality: N/A;  . INSERT / REPLACE / REMOVE PACEMAKER  01/24/2013    Medtronic Adapta L dual-chamber pacemaker, serial  number NWE [id number]   . LEFT HEART CATH AND CORONARY ANGIOGRAPHY N/A 06/07/2018   Procedure: LEFT HEART CATH AND CORONARY ANGIOGRAPHY;  Surgeon: [name], MD;  Location: [facility];  Service: Cardiovascular;  Laterality: N/A;  . LEFT HEART CATHETERIZATION WITH CORONARY ANGIOGRAM N/A 03/17/2013   Procedure: LEFT HEART CATHETERIZATION WITH CORONARY ANGIOGRAM;  Surgeon: Burnell Blanks, MD; LAD mild dz, D1 branch 100%, inferior branch 99%, CFX OK, RCA 50%, EF 65%    . LOOP RECORDER IMPLANT  2002  . PERMANENT PACEMAKER INSERTION N/A 01/24/2013   Procedure: PERMANENT PACEMAKER INSERTION;  Surgeon: [name], MD;  Location: [facility];  Service: Cardiovascular;  Laterality: N/A;  . TIBIAL TUBERCLERPLASTY  ~ 2003   Social History   Tobacco Use  . Smoking status: Former Smoker    Packs/day: 1.00    Years: 42.00    Pack years: 42.00    Types: Cigarettes    Quit date: 12/31/2011    Years since quitting: 7.6  . Smokeless tobacco: Never Used  Substance Use Topics  . Alcohol use: Not Currently    Comment: 03/14/2013 "stopped drinking back in 2002; never had problem w/it"  . Drug use: No   Family History  Problem Relation Age of Onset  . Alzheimer's disease Mother   . Osteoporosis Mother    No Known Allergies Current Outpatient Medications on File Prior to Visit  Medication Sig Dispense Refill  . buPROPion (ZYBAN) 150 MG 12 hr tablet TAKE 1 TABLET BY MOUTH TWICE A DAY 180 tablet 1  . clopidogrel (PLAVIX) 75 MG tablet TAKE 1 TABLET BY MOUTH EVERY DAY 90 tablet 0  . diclofenac Sodium (VOLTAREN) 1 % GEL Apply 2 g topically 4 (four) times daily. 50 g 0  . escitalopram (LEXAPRO) 20 MG tablet TAKE 1 TABLET (20 MG TOTAL) BY MOUTH DAILY. FOR DEPRESSION. 90 tablet 1  . fish oil-omega-3 fatty acids 1000 MG capsule Take 1 g by mouth daily.      . fluticasone (FLONASE) 50 MCG/ACT nasal spray SMARTSIG:1 Spray(s) Both Nares Daily PRN    . furosemide (LASIX) 20 MG tablet TAKE 1  TABLET BY MOUTH EVERY DAY 90 tablet 2  . gabapentin (NEURONTIN) 300 MG capsule TAKE 1 CAPSULE BY MOUTH THREE TIMES DAILY FOR DIABETIC NEUROPATHY. 270 capsule 1  . glucose blood (ONE TOUCH ULTRA TEST) test strip USE TO TEST UP TO 4 TIMES DAILY 100 each 2  . isosorbide mononitrate (IMDUR) 60 MG 24 hr tablet Take 1 tablet (60 mg total) by mouth daily. 90 tablet 1  . JARDIANCE 10 MG TABS tablet TAKE 1 TABLET BY MOUTH EVERY MORNING FOR DIABETES. 90 tablet 1  . lisinopril (ZESTRIL) 5 MG tablet Take 5 mg by mouth daily.    . metFORMIN (GLUCOPHAGE) 500 MG tablet TAKE 1 TABLET BY MOUTH TWICE DAILY WITH A MEAL 180 tablet 1  .  metoprolol succinate (TOPROL-XL) 25 MG 24 hr tablet TAKE 1 TABLET BY MOUTH EVERY DAY 90 tablet 3  . nitroGLYCERIN (NITROSTAT) 0.4 MG SL tablet Place 1 tablet (0.4 mg total) under the tongue every 5 (five) minutes as needed for chest pain. 25 tablet 0  . pantoprazole (PROTONIX) 40 MG tablet TAKE 1 TABLET (40 MG TOTAL) BY MOUTH DAILY FOR HEARTBURN. 90 tablet 1  . rosuvastatin (CRESTOR) 20 MG tablet TAKE 1 TABLET BY MOUTH EVERY DAY IN THE EVENING FOR CHOLESTEROL 90 tablet 1  . sitaGLIPtin (JANUVIA) 100 MG tablet TAKE 1 TABLET BY MOUTH ONCE DAILY FOR DIABETES. 90 tablet 1  . tamsulosin (FLOMAX) 0.4 MG CAPS capsule TAKE 1 CAPSULE (0.4 MG TOTAL) BY MOUTH 2 (TWO) TIMES DAILY. 180 capsule 3  . warfarin (COUMADIN) 5 MG tablet TAKE 1 TABLET ON MONDAY, TAKE HALF TAB EVERY DAY EXCEPT FOR MONDAY. 90 tablet 0  . guaiFENesin-codeine 100-10 MG/5ML syrup Take 5 mLs by mouth 3 (three) times daily as needed for cough. (Patient not taking: Reported on 08/12/2019) 120 mL 0   No current facility-administered medications on file prior to visit.     Review of Systems  Constitutional: Negative for activity change, appetite change, fatigue, fever and unexpected weight change.  HENT: Positive for ear pain. Negative for congestion, ear discharge, facial swelling, hearing loss, mouth sores, postnasal drip,  rhinorrhea, sinus pressure, sinus pain, sore throat, trouble swallowing and voice change.   Eyes: Negative for pain, redness, itching and visual disturbance.  Respiratory: Negative for cough, chest tightness, shortness of breath and wheezing.   Cardiovascular: Negative for chest pain and palpitations.  Gastrointestinal: Negative for abdominal pain, blood in stool, constipation, diarrhea and nausea.  Endocrine: Negative for cold intolerance, heat intolerance, polydipsia and polyuria.  Genitourinary: Negative for difficulty urinating, dysuria, frequency and urgency.  Musculoskeletal: Negative for arthralgias, joint swelling and myalgias.  Skin: Negative for pallor and rash.  Neurological: Negative for dizziness, tremors, weakness, numbness and headaches.  Hematological: Negative for adenopathy. Does not bruise/bleed easily.  Psychiatric/Behavioral: Negative for decreased concentration and dysphoric mood. The patient is not nervous/anxious.        Objective:   Physical Exam Constitutional:      General: He is not in acute distress.    Appearance: Normal appearance. He is obese. He is not ill-appearing or diaphoretic.  HENT:     Head: Normocephalic and atraumatic.     Comments: No sinus or temple pain  Some tenderness of both TM joints on palpation- but pt states this is different from his ear pain      Right Ear: Ear canal and external ear normal. There is no impacted cerumen.     Left Ear: Tympanic membrane and external ear normal. There is no impacted cerumen.     Ears:     Comments: R TM is dull / no definite effusion see Some dry skin in ear canal   L TM-appears normal  There is a small red area in canal posteriorly (unable to reach) that resembles a drop of dried blood  Difficult to tell if there was trauma there     Nose:     Comments: Boggy nares    Mouth/Throat:     Mouth: Mucous membranes are moist.     Pharynx: Oropharynx is clear.  Eyes:     General:        Right eye:  No discharge.        Left eye: No discharge.  Conjunctiva/sclera: Conjunctivae normal.     Pupils: Pupils are equal, round, and reactive to light.  Cardiovascular:     Rate and Rhythm: Normal rate and regular rhythm.     Heart sounds: Normal heart sounds.  Pulmonary:     Effort: Pulmonary effort is normal. No respiratory distress.     Breath sounds: Normal breath sounds. No wheezing or rales.  Musculoskeletal:     Cervical back: Normal range of motion.  Lymphadenopathy:     Cervical: No cervical adenopathy.  Skin:    General: Skin is warm and dry.     Findings: No rash.  Neurological:     Mental Status: He is alert.     Cranial Nerves: No cranial nerve deficit.  Psychiatric:        Mood and Affect: Mood normal.           Assessment & Plan:   Problem List Items Addressed This Visit      Other   Right ear pain    Suspect ETD- TM is dull  Some flaky skin in canal  Interestingly there is a red area in L ear canal -resembles blood (he is on warfarin)  inst to inc his flonase to bid for 4 d and then return to daily and let us know if any improvement in ear pain  Also px cortisporin ear solution for both ears  He will need re check of L ear canal after treatment       Ear canal dryness, bilateral    Unsure if adding to R ear pain or abrasion in L ear canal  Trial of cortisporin and then will plan re check

## 2019-08-12 NOTE — Assessment & Plan Note (Signed)
Suspect ETD- TM is dull  Some flaky skin in canal  Interestingly there is a red area in L ear canal -resembles blood (he is on warfarin)  inst to inc his flonase to bid for 4 d and then return to daily and let us know if any improvement in ear pain  Also px cortisporin ear solution for both ears  He will need re check of L ear canal after treatment

## 2019-08-12 NOTE — Patient Instructions (Addendum)
For fluid behind the ear drum - please increase flonase to twice daily for 4 days and then go back to once daily  This may help open up the inner ear to relieve pressure and pain   Also use the ear solution three times daily in both ears  You have a red spot in the left ear canal that will need to be re checked   Be mindful or any jaw grinding or clenching- this can radiate pain to the ear area as well   Try a warm or cool compress on the right ear to help discomfort also   Tylenol is ok for ear pain as well   Update if not starting to improve in a week or if worsening

## 2019-08-13 ENCOUNTER — Ambulatory Visit (INDEPENDENT_AMBULATORY_CARE_PROVIDER_SITE_OTHER): Payer: Medicare Other | Admitting: Family Medicine

## 2019-08-13 ENCOUNTER — Ambulatory Visit (INDEPENDENT_AMBULATORY_CARE_PROVIDER_SITE_OTHER)
Admission: RE | Admit: 2019-08-13 | Discharge: 2019-08-13 | Disposition: A | Discharge status: Home / Self Care | Payer: Medicare Other | Source: Ambulatory Visit | Attending: Family Medicine | Admitting: Family Medicine

## 2019-08-13 VITALS — BP 90/60 | HR 81 | Temp 98.0°F | Ht 71.0 in | Wt 226.8 lb

## 2019-08-13 DIAGNOSIS — M79671 Pain in right foot: Secondary | ICD-10-CM

## 2019-08-13 DIAGNOSIS — M25571 Pain in right ankle and joints of right foot: Secondary | ICD-10-CM | POA: Diagnosis not present

## 2019-08-14 ENCOUNTER — Encounter: Payer: Self-pay | Admitting: Family Medicine

## 2019-08-14 ENCOUNTER — Other Ambulatory Visit: Payer: Self-pay | Admitting: Internal Medicine

## 2019-08-19 ENCOUNTER — Ambulatory Visit (INDEPENDENT_AMBULATORY_CARE_PROVIDER_SITE_OTHER): Payer: Medicare Other | Admitting: *Deleted

## 2019-08-19 DIAGNOSIS — I4819 Other persistent atrial fibrillation: Secondary | ICD-10-CM

## 2019-08-19 LAB — CUP PACEART REMOTE DEVICE CHECK
Battery Impedance: 471 Ohm
Battery Remaining Longevity: 97 mo
Battery Voltage: 2.79 V
Brady Statistic RV Percent Paced: 100 %
Date Time Interrogation Session: 20210503164358
Implantable Lead Implant Date: 20141010
Implantable Lead Implant Date: 20141010
Implantable Lead Location: 753859
Implantable Lead Location: 753860
Implantable Lead Model: 5076
Implantable Lead Model: 5076
Implantable Pulse Generator Implant Date: 20141010
Lead Channel Impedance Value: 67 Ohm
Lead Channel Impedance Value: 712 Ohm
Lead Channel Pacing Threshold Amplitude: 0.875 V
Lead Channel Pacing Threshold Pulse Width: 0.4 ms
Lead Channel Setting Pacing Amplitude: 2.5 V
Lead Channel Setting Pacing Pulse Width: 0.4 ms
Lead Channel Setting Sensing Sensitivity: 4 mV

## 2019-08-20 NOTE — Progress Notes (Signed)
Remote pacemaker transmission.   

## 2019-08-23 ENCOUNTER — Other Ambulatory Visit: Payer: Self-pay | Admitting: Internal Medicine

## 2019-08-25 ENCOUNTER — Other Ambulatory Visit: Payer: Self-pay

## 2019-08-25 MED ORDER — FUROSEMIDE 20 MG PO TABS: 20.0000 mg | ORAL_TABLET | Freq: Every day | ORAL | 3 refills | Status: DC

## 2019-08-25 MED ORDER — LISINOPRIL 5 MG PO TABS: 5.0000 mg | ORAL_TABLET | Freq: Every day | ORAL | 0 refills | Status: DC

## 2019-08-25 NOTE — Telephone Encounter (Signed)
Refill provided

## 2019-08-25 NOTE — Telephone Encounter (Signed)
Patient contacted the office requesting a refill on Lisinopril.  This is listed on historical medication list from [place].  Is this ok to refill? [facility] [place]

## 2019-08-28 ENCOUNTER — Other Ambulatory Visit: Payer: Self-pay | Admitting: Primary Care

## 2019-08-28 DIAGNOSIS — E1142 Type 2 diabetes mellitus with diabetic polyneuropathy: Secondary | ICD-10-CM

## 2019-09-04 ENCOUNTER — Ambulatory Visit: Payer: Medicare Other | Admitting: Family Medicine

## 2019-09-04 ENCOUNTER — Other Ambulatory Visit: Payer: Self-pay

## 2019-09-04 ENCOUNTER — Ambulatory Visit (INDEPENDENT_AMBULATORY_CARE_PROVIDER_SITE_OTHER): Payer: Medicare Other

## 2019-09-04 DIAGNOSIS — Z7901 Long term (current) use of anticoagulants: Secondary | ICD-10-CM | POA: Diagnosis not present

## 2019-09-04 LAB — POCT INR: INR: 2.4 (ref 2.0–3.0)

## 2019-09-04 NOTE — Patient Instructions (Addendum)
Pre visit review using our clinic review tool, if applicable. No additional management support is needed unless otherwise documented below in the visit note.  Continue to take 1/2  tablet (2.5 mg) daily, except take 1 tablet (5mg ) on Monday.    Re-check in 4 weeks .

## 2019-09-08 ENCOUNTER — Other Ambulatory Visit: Payer: Self-pay | Admitting: Internal Medicine

## 2019-09-23 ENCOUNTER — Other Ambulatory Visit: Payer: Self-pay | Admitting: Primary Care

## 2019-09-23 DIAGNOSIS — E119 Type 2 diabetes mellitus without complications: Secondary | ICD-10-CM

## 2019-09-23 DIAGNOSIS — E785 Hyperlipidemia, unspecified: Secondary | ICD-10-CM

## 2019-10-02 ENCOUNTER — Other Ambulatory Visit: Payer: Self-pay

## 2019-10-02 ENCOUNTER — Ambulatory Visit (INDEPENDENT_AMBULATORY_CARE_PROVIDER_SITE_OTHER): Payer: Medicare Other

## 2019-10-02 DIAGNOSIS — Z7901 Long term (current) use of anticoagulants: Secondary | ICD-10-CM | POA: Diagnosis not present

## 2019-10-02 LAB — POCT INR: INR: 1.9 — AB (ref 2.0–3.0)

## 2019-10-02 NOTE — Patient Instructions (Addendum)
Pre visit review using our clinic review tool, if applicable. No additional management support is needed unless otherwise documented below in the visit note.  Increase dose today to 5mg  then continue to take 1/2  tablet (2.5 mg) daily, except take 1 tablet (5mg ) on Monday.    Re-check in 4 weeks .

## 2019-10-06 ENCOUNTER — Other Ambulatory Visit: Payer: Medicare Other

## 2019-10-06 ENCOUNTER — Ambulatory Visit (INDEPENDENT_AMBULATORY_CARE_PROVIDER_SITE_OTHER): Payer: Medicare Other

## 2019-10-06 ENCOUNTER — Other Ambulatory Visit: Payer: Self-pay

## 2019-10-06 DIAGNOSIS — Z0000 Encounter for general adult medical examination without abnormal findings: Secondary | ICD-10-CM | POA: Diagnosis not present

## 2019-10-06 NOTE — Progress Notes (Signed)
PCP notes:  Health Maintenance: Pneumococcal 23- due Foot exam- due   Abnormal Screenings: none   Patient concerns: Right foot pain    Nurse concerns: none   Next PCP appt.: 10/09/2019 @ 11:20 am

## 2019-10-06 NOTE — Progress Notes (Signed)
Subjective:   [name] Cook is a 69 y.o. male who presents for Medicare Annual/Subsequent preventive examination.  Review of Systems: N/A      I connected with the patient today by telephone and verified that I am speaking with the correct person using two identifiers. Location patient: home Location nurse: work Persons participating in the virtual visit: patient, Marine scientist.   I discussed the limitations, risks, security and privacy concerns of performing an evaluation and management service by telephone and the availability of in person appointments. I also discussed with the patient that there may be a patient responsible charge related to this service. The patient expressed understanding and verbally consented to this telephonic visit.    Interactive audio and video telecommunications were attempted between this nurse and patient, however failed, due to patient having technical difficulties OR patient did not have access to video capability.  We continued and completed visit with audio only.     Cardiac Risk Factors include: advanced age (>54men, >69 women);male gender;diabetes mellitus;hypertension;dyslipidemia     Objective:    Today's Vitals   10/06/19 1157  PainSc: 8    There is no height or weight on file to calculate BMI.  Advanced Directives 10/06/2019 07/16/2019 01/03/2019 12/30/2018 09/27/2018 09/23/2018 06/07/2018  Does Patient Have a Medical Advance Directive? No No No No No No No  Would patient like information on creating a medical advance directive? No - Patient declined No - Patient declined No - Patient declined No - Patient declined No - Patient declined No - Patient declined Yes (Inpatient - patient requests chaplain consult to create a medical advance directive)  Pre-existing out of facility DNR order (yellow form or pink MOST form) - - - - - - -    Current Medications (verified) Outpatient Encounter Medications as of 10/06/2019  Medication Sig  . buPROPion (ZYBAN) 150  MG 12 hr tablet TAKE 1 TABLET BY MOUTH TWICE A DAY  . clopidogrel (PLAVIX) 75 MG tablet TAKE 1 TABLET BY MOUTH EVERY DAY  . diclofenac Sodium (VOLTAREN) 1 % GEL Apply 2 g topically 4 (four) times daily.  Marland Kitchen escitalopram (LEXAPRO) 20 MG tablet TAKE 1 TABLET (20 MG TOTAL) BY MOUTH DAILY. FOR DEPRESSION.  . fish oil-omega-3 fatty acids 1000 MG capsule Take 1 g by mouth daily.    . fluticasone (FLONASE) 50 MCG/ACT nasal spray SMARTSIG:1 Spray(s) Both Nares Daily PRN  . furosemide (LASIX) 20 MG tablet Take 1 tablet (20 mg total) by mouth daily.  Marland Kitchen gabapentin (NEURONTIN) 300 MG capsule Take 1 capsule (300 mg total) by mouth 3 (three) times daily.  Marland Kitchen glucose blood (ONE TOUCH ULTRA TEST) test strip USE TO TEST UP TO 4 TIMES DAILY  . guaiFENesin-codeine 100-10 MG/5ML syrup Take 5 mLs by mouth 3 (three) times daily as needed for cough.  . isosorbide mononitrate (IMDUR) 60 MG 24 hr tablet Take 1 tablet (60 mg total) by mouth daily.  Marland Kitchen JARDIANCE 10 MG TABS tablet TAKE 1 TABLET BY MOUTH EVERY MORNING FOR DIABETES.  Marland Kitchen lisinopril (ZESTRIL) 5 MG tablet Take 1 tablet (5 mg total) by mouth daily.  . metFORMIN (GLUCOPHAGE) 500 MG tablet TAKE 1 TABLET BY MOUTH TWICE DAILY WITH A MEAL  . metoprolol succinate (TOPROL-XL) 25 MG 24 hr tablet TAKE 1 TABLET BY MOUTH EVERY DAY  . neomycin-polymyxin-hydrocortisone (CORTISPORIN) OTIC solution Place 3 drops into both ears 3 (three) times daily.  . nitroGLYCERIN (NITROSTAT) 0.4 MG SL tablet Place 1 tablet (0.4 mg total) under  the tongue every 5 (five) minutes as needed for chest pain.  . pantoprazole (PROTONIX) 40 MG tablet TAKE 1 TABLET (40 MG TOTAL) BY MOUTH DAILY FOR HEARTBURN.  . rosuvastatin (CRESTOR) 20 MG tablet TAKE 1 TABLET BY MOUTH EVERY DAY IN THE EVENING FOR CHOLESTEROL  . sitaGLIPtin (JANUVIA) 100 MG tablet TAKE 1 TABLET BY MOUTH ONCE DAILY FOR DIABETES.  . tamsulosin (FLOMAX) 0.4 MG CAPS capsule TAKE 1 CAPSULE (0.4 MG TOTAL) BY MOUTH 2 (TWO) TIMES DAILY.  Marland Kitchen  warfarin (COUMADIN) 5 MG tablet TAKE 1 TABLET ON MONDAY, TAKE HALF TAB EVERY DAY EXCEPT FOR MONDAY.   No facility-administered encounter medications on file as of 10/06/2019.    Allergies (verified) Patient has no known allergies.   History: Past Medical History:  Diagnosis Date  . Arthritis    "knees and lower back" (03/14/2013)  . Atrial flutter ([place])    radiofrequency ablation in 2001  . CAD (coronary artery disease)    a. Nonobstructive. Cardiac cath in 2001-50% mid RI, normal LM, LAD, RCA b. cath 10/16/2014 95% mid RCA treated with DES, 99% ost D1 medical management due to small aneurysmal segment  . Chronic anticoagulation    chronic Coumadin anticoagulation  . Chronic obstructive pulmonary disease ([place]) 04/20/2011  . Diabetes mellitus, type 2 ([place])   . GERD (gastroesophageal reflux disease)   . Hyperlipidemia   . Hypertension    with hypertensive heart disease  . Obesity   . Persistent atrial fibrillation ([facility])    recurrent atrial flutter since 2001 s/p DCCVs, multiple failed AADs, h/o tachy-mediated cardiomyopathy  . Shortness of breath    "can come on at any time" (03/14/2013)  . Sleep apnea    "dx'd; couldn't wear the mask" (03/14/2013)  . Tobacco abuse    Past Surgical History:  Procedure Laterality Date  . ATRIAL FLUTTER ABLATION  2002   atrial flutter; subsequently developed atrial fibrillation  . AV NODE ABLATION  01/24/2013  . CARDIAC CATHETERIZATION  2002  . CARDIAC CATHETERIZATION N/A 10/16/2014   Procedure: Left Heart Cath and Coronary Angiography;  Surgeon: [name], MD;  Location: [place] [facility];  Service: Cardiovascular;  Laterality: N/A;  . CARDIOVERSION  05/31/2011   Procedure: CARDIOVERSION;  Surgeon: Cristopher Estimable. Lattie Haw, MD;  Location: [facility];  Service: Cardiovascular;  Laterality: N/A;  . CARPAL TUNNEL RELEASE Left 1980's  . COLONOSCOPY WITH PROPOFOL N/A 12/30/2018   Procedure: COLONOSCOPY WITH PROPOFOL;  Surgeon: Jonathon Bellows, MD;  Location:  [facility];  Service: Gastroenterology;  Laterality: N/A;  . INSERT / REPLACE / REMOVE PACEMAKER  01/24/2013    Medtronic Adapta L dual-chamber pacemaker, serial number [id number]   . LEFT HEART CATH AND CORONARY ANGIOGRAPHY N/A 06/07/2018   Procedure: LEFT HEART CATH AND CORONARY ANGIOGRAPHY;  Surgeon: [name], MD;  Location: [place] [facility];  Service: Cardiovascular;  Laterality: N/A;  . LEFT HEART CATHETERIZATION WITH CORONARY ANGIOGRAM N/A 03/17/2013   Procedure: LEFT HEART CATHETERIZATION WITH CORONARY ANGIOGRAM;  Surgeon: Burnell Blanks, MD; LAD mild dz, D1 branch 100%, inferior branch 99%, CFX OK, RCA 50%, EF 65%    . LOOP RECORDER IMPLANT  2002  . PERMANENT PACEMAKER INSERTION N/A 01/24/2013   Procedure: PERMANENT PACEMAKER INSERTION;  Surgeon: [name], MD;  [facility];  Service: Cardiovascular;  Laterality: N/A;  . TIBIAL TUBERCLERPLASTY  ~ 2003   Family History  Problem Relation Age of Onset  . Alzheimer's disease Mother   . Osteoporosis Mother  Social History   Socioeconomic History  . Marital status: Widowed    Spouse name: Not on file  . Number of children: 1  . Years of education: Not on file  . Highest education level: Not on file  Occupational History  . Occupation: Merchandiser, retail: UNEMPLOYED  Tobacco Use  . Smoking status: Former Smoker    Packs/day: 1.00    Years: 42.00    Pack years: 42.00    Types: Cigarettes    Quit date: 12/31/2011    Years since quitting: 7.7  . Smokeless tobacco: Never Used  Vaping Use  . Vaping Use: Never used  Substance and Sexual Activity  . Alcohol use: Not Currently    Comment: 03/14/2013 "stopped drinking back in 2002; never had problem w/it"  . Drug use: No  . Sexual activity: Not Currently  Other Topics Concern  . Not on file  Social History Narrative   Single.   Retired.    1 son, deceased.    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   Enjoys playing on the computer.       Social Determinants of Health   Financial Resource Strain: Low Risk   . Difficulty of Paying Living Expenses: Not hard at all  Food Insecurity: No Food Insecurity  . Worried About Charity fundraiser in the Last Year: Never true  . Ran Out of Food in the Last Year: Never true  Transportation Needs: No Transportation Needs  . Lack of Transportation (Medical): No  . Lack of Transportation (Non-Medical): No  Physical Activity: Inactive  . Days of Exercise per Week: 0 days  . Minutes of Exercise per Session: 0 min  Stress: No Stress Concern Present  . Feeling of Stress : Not at all  Social Connections:   . Frequency of Communication with Friends and Family:   . Frequency of Social Gatherings with Friends and Family:   . Attends Religious Services:   . Active Member of Clubs or Organizations:   . Attends Archivist Meetings:   Marland Kitchen Marital Status:     Tobacco Counseling Counseling given: Not Answered   Clinical Intake:  Pre-visit preparation completed: Yes  Pain : 0-10 Pain Score: 8  Pain Type: Acute pain Pain Location: Foot Pain Orientation: Right Pain Descriptors / Indicators: Aching Pain Onset: More than a month ago Pain Frequency: Intermittent     Nutritional Risks: None Diabetes: Yes CBG done?: No Did pt. bring in CBG monitor from home?: No  How often do you need to have someone help you when you read instructions, pamphlets, or other written materials from your doctor or pharmacy?: 1 - Never What is the last grade level you completed in school?: 9th  Diabetic? Yes Nutrition Risk Assessment:  Has the patient had any N/V/D within the last 2 months?  No  Does the patient have any non-healing wounds?  No  Has the patient had any unintentional weight loss or weight gain?  No   Diabetes:  Is the patient diabetic?  Yes  If diabetic, was a CBG obtained today?  No  Did the patient bring in their  glucometer from home?  No  How often do you monitor your CBG's? 2-3 times a week.   Financial Strains and Diabetes Management:  Are you having any financial strains with the device, your supplies or your medication? No .  Does the patient want to be seen by Chronic Care  Management for management of their diabetes?  No  Would the patient like to be referred to a Nutritionist or for Diabetic Management?  No   Diabetic Exams:  Diabetic Eye Exam: Completed 09/05/2019 per patient Diabetic Foot Exam: Overdue, Pt has been advised about the importance in completing this exam. Pt is scheduled for diabetic foot exam on 10/09/2019.   Interpreter Needed?: No  Information entered by :: CJohnson, LPN   Activities of Daily Living In your present state of health, do you have any difficulty performing the following activities: 10/06/2019 08/12/2019  Hearing? N N  Vision? N N  Difficulty concentrating or making decisions? N N  Walking or climbing stairs? N N  Dressing or bathing? N N  Doing errands, shopping? N N  Preparing Food and eating ? N -  Using the Toilet? N -  In the past six months, have you accidently leaked urine? N -  Do you have problems with loss of bowel control? N -  Managing your Medications? N -  Managing your Finances? N -  Housekeeping or managing your Housekeeping? N -  Some recent data might be hidden    Patient Care Team: [name], NP as PCP - General (Internal Medicine) [name], MD as PCP - Cardiology (Cardiology) Rothbart, Cristopher Estimable, MD (Cardiology) Noreene Filbert, MD as Radiation Oncologist (Radiation Oncology)  Indicate any recent Medical Services you may have received from other than [facility] providers in the past year (date may be approximate).     Assessment:   This is a routine wellness examination for [place].  Hearing/Vision screen  Hearing Screening   125Hz  250Hz  500Hz  1000Hz  2000Hz  3000Hz  4000Hz  6000Hz  8000Hz   Right ear:           Left  ear:           Vision Screening Comments: Patient gets annual eye exams   Dietary issues and exercise activities discussed: Current Exercise Habits: The patient does not participate in regular exercise at present, Exercise limited by: None identified  Goals    . Patient Stated     Starting 09/23/2018, I will continue to take medications as prescribed.     . Patient Stated     10/06/2019, I will maintain and continue medications as prescribed.       Depression Screen PHQ 2/9 Scores 10/06/2019 08/12/2019 09/23/2018 06/27/2018 09/21/2017 05/31/2017 04/30/2017  PHQ - 2 Score 0 0 0 6 0 0 0  PHQ- 9 Score 0 0 0 16 0 - -    Fall Risk Fall Risk  10/06/2019 08/12/2019 09/23/2018 09/21/2017 05/31/2017  Falls in the past year? 0 0 0 No No  Number falls in past yr: 0 0 - - -  Injury with Fall? 0 0 - - -  Risk for fall due to : Medication side effect - - - -  Follow up Falls evaluation completed;Falls prevention discussed Falls evaluation completed - - -    Any stairs in or around the home? Yes  If so, are there any without handrails? No  Home free of loose throw rugs in walkways, pet beds, electrical cords, etc? Yes  Adequate lighting in your home to reduce risk of falls? Yes   ASSISTIVE DEVICES UTILIZED TO PREVENT FALLS:  Life alert? No  Use of a cane, walker or w/c? No  Grab bars in the bathroom? No  Shower chair or bench in shower? No  Elevated toilet seat or a handicapped toilet? No   TIMED  UP AND GO:  Was the test performed? telephonic visit, N/A.    Cognitive Function: MMSE - Mini Mental State Exam 10/06/2019 09/23/2018 09/21/2017  Not completed: Refused - -  Orientation to time - 5 5  Orientation to Place - 5 5  Registration - 3 3  Attention/ Calculation - 0 0  Recall - 0 1  Recall-comments - unable to recall 0 of 3 words unable to recall 2 of 3 words  Language- name 2 objects - 0 0  Language- repeat - 1 1  Language- follow 3 step command - 0 3  Language- read & follow direction - 0 0    Write a sentence - 0 0  Copy design - 0 0  Total score - 14 18  Mini Cog  Mini-Cog screen was not completed. Patient refused. Maximum score is 22. A value of 0 denotes this part of the MMSE was not completed or the patient failed this part of the Mini-Cog screening.       Immunizations Immunization History  Administered Date(s) Administered  . Influenza, High Dose Seasonal PF 02/20/2018, 02/03/2019  . Influenza,inj,Quad PF,6+ Mos 02/05/2014, 01/04/2015, 04/20/2016  . Influenza-Unspecified 01/05/2011, 01/15/2013  . PFIZER SARS-COV-2 Vaccination 09/17/2019  . Pneumococcal Conjugate-13 09/21/2017  . Pneumococcal Polysaccharide-23 01/05/2011  . Tdap 01/05/2011    TDAP status: Up to date Flu Vaccine status: Up to date Pneumococcal vaccine status: Pneumococcal 23 due Covid-19 vaccine status: Completed first vaccine, second is scheduled for 10/08/2019  Qualifies for Shingles Vaccine? Yes   Zostavax completed No   Shingrix Completed?: No.    Education has been provided regarding the importance of this vaccine. Patient has been advised to call insurance company to determine out of pocket expense if they have not yet received this vaccine. Advised may also receive vaccine at local pharmacy or Health Dept. Verbalized acceptance and understanding.  Screening Tests Health Maintenance  Topic Date Due  . PNA vac Low Risk Adult (2 of 2 - PPSV23) 09/22/2018  . FOOT EXAM  04/03/2019  . HEMOGLOBIN A1C  09/29/2019  . COVID-19 Vaccine (2 - Pfizer 2-dose series) 10/08/2019  . INFLUENZA VACCINE  11/16/2019  . OPHTHALMOLOGY EXAM  09/04/2020  . TETANUS/TDAP  01/04/2021  . COLONOSCOPY  12/29/2028  . Hepatitis C Screening  Completed    Health Maintenance  Health Maintenance Due  Topic Date Due  . PNA vac Low Risk Adult (2 of 2 - PPSV23) 09/22/2018  . FOOT EXAM  04/03/2019  . HEMOGLOBIN A1C  09/29/2019  . COVID-19 Vaccine (2 - Pfizer 2-dose series) 10/08/2019    Colorectal cancer  screening: Completed 12/30/2018. Repeat every 10 years  Lung Cancer Screening: (Low Dose CT Chest recommended if Age 36-80 years, 30 pack-year currently smoking OR have quit w/in 15years.) does not qualify.     Additional Screening:  Hepatitis C Screening: does qualify; Completed 10/27/2015  Vision Screening: Recommended annual ophthalmology exams for early detection of glaucoma and other disorders of the eye. Is the patient up to date with their annual eye exam?  Yes  Who is the provider or what is the name of the office in which the patient attends annual eye exams? [facility] If pt is not established with a provider, would they like to be referred to a provider to establish care? No .   Dental Screening: Recommended annual dental exams for proper oral hygiene  Community Resource Referral / Chronic Care Management: CRR required this visit?  No   CCM required  this visit?  No      Plan:    Patient will maintain and continue medications as prescribed.   I have personally reviewed and noted the following in the patient's chart:   . Medical and social history . Use of alcohol, tobacco or illicit drugs  . Current medications and supplements . Functional ability and status . Nutritional status . Physical activity . Advanced directives . List of other physicians . Hospitalizations, surgeries, and ER visits in previous 12 months . Vitals . Screenings to include cognitive, depression, and falls . Referrals and appointments  In addition, I have reviewed and discussed with patient certain preventive protocols, quality metrics, and best practice recommendations. A written personalized care plan for preventive services as well as general preventive health recommendations were provided to patient.   Due to this being a telephonic visit, the after visit summary with patients personalized plan was offered to patient via mail or my-chart. Patient preferred to pick up at office at next  visit.  Andrez Grime, LPN   12/02/5629

## 2019-10-06 NOTE — Patient Instructions (Addendum)
[name] , Thank you for taking time to come for your Medicare Wellness Visit. I appreciate your ongoing commitment to your health goals. Please review the following plan we discussed and let me know if I can assist you in the future.   Screening recommendations/referrals: Colonoscopy: Up to date, completed 12/30/2018, due 12/2028 Recommended yearly ophthalmology/optometry visit for glaucoma screening and checkup Recommended yearly dental visit for hygiene and checkup  Vaccinations: Influenza vaccine: Up to date, completed 02/03/2019, due 11/2019 Pneumococcal vaccine: Pneumococcal 23 due  Tdap vaccine: Up to date, completed 01/05/2011, due 12/2020 Shingles vaccine: discussed    Advanced directives: Advance directive discussed with you today. Even though you declined this today please call our office should you change your mind and we can give you the proper paperwork for you to fill out.  Conditions/risks identified: diabetes, hypertension, hyperlipidemia  Next appointment: 10/09/2019 @ 11:20 am   Preventive Care 65 Years and Older, Male Preventive care refers to lifestyle choices and visits with your health care provider that can promote health and wellness. What does preventive care include?  A yearly physical exam. This is also called an annual well check.  Dental exams once or twice a year.  Routine eye exams. Ask your health care provider how often you should have your eyes checked.  Personal lifestyle choices, including:  Daily care of your teeth and gums.  Regular physical activity.  Eating a healthy diet.  Avoiding tobacco and drug use.  Limiting alcohol use.  Practicing safe sex.  Taking low doses of aspirin every day.  Taking vitamin and mineral supplements as recommended by your health care provider. What happens during an annual well check? The services and screenings done by your health care provider during your annual well check will depend on your age,  overall health, lifestyle risk factors, and family history of disease. Counseling  Your health care provider may ask you questions about your:  Alcohol use.  Tobacco use.  Drug use.  Emotional well-being.  Home and relationship well-being.  Sexual activity.  Eating habits.  History of falls.  Memory and ability to understand (cognition).  Work and work Statistician. Screening  You may have the following tests or measurements:  Height, weight, and BMI.  Blood pressure.  Lipid and cholesterol levels. These may be checked every 5 years, or more frequently if you are over 48 years old.  Skin check.  Lung cancer screening. You may have this screening every year starting at age 85 if you have a 30-pack-year history of smoking and currently smoke or have quit within the past 15 years.  Fecal occult blood test (FOBT) of the stool. You may have this test every year starting at age 32.  Flexible sigmoidoscopy or colonoscopy. You may have a sigmoidoscopy every 5 years or a colonoscopy every 10 years starting at age 64.  Prostate cancer screening. Recommendations will vary depending on your family history and other risks.  Hepatitis C blood test.  Hepatitis B blood test.  Sexually transmitted disease (STD) testing.  Diabetes screening. This is done by checking your blood sugar (glucose) after you have not eaten for a while (fasting). You may have this done every 1-3 years.  Abdominal aortic aneurysm (AAA) screening. You may need this if you are a current or former smoker.  Osteoporosis. You may be screened starting at age 73 if you are at high risk. Talk with your health care provider about your test results, treatment options, and if necessary, the  need for more tests. Vaccines  Your health care provider may recommend certain vaccines, such as:  Influenza vaccine. This is recommended every year.  Tetanus, diphtheria, and acellular pertussis (Tdap, Td) vaccine. You may  need a Td booster every 10 years.  Zoster vaccine. You may need this after age 64.  Pneumococcal 13-valent conjugate (PCV13) vaccine. One dose is recommended after age 22.  Pneumococcal polysaccharide (PPSV23) vaccine. One dose is recommended after age 24. Talk to your health care provider about which screenings and vaccines you need and how often you need them. This information is not intended to replace advice given to you by your health care provider. Make sure you discuss any questions you have with your health care provider. Document Released: 04/30/2015 Document Revised: 12/22/2015 Document Reviewed: 02/02/2015 Elsevier Interactive Patient Education  2017 [place] Prevention in the Home Falls can cause injuries. They can happen to people of all ages. There are many things you can do to make your home safe and to help prevent falls. What can I do on the outside of my home?  Regularly fix the edges of walkways and driveways and fix any cracks.  Remove anything that might make you trip as you walk through a door, such as a raised step or threshold.  Trim any bushes or trees on the path to your home.  Use bright outdoor lighting.  Clear any walking paths of anything that might make someone trip, such as rocks or tools.  Regularly check to see if handrails are loose or broken. Make sure that both sides of any steps have handrails.  Any raised decks and porches should have guardrails on the edges.  Have any leaves, snow, or ice cleared regularly.  Use sand or salt on walking paths during winter.  Clean up any spills in your garage right away. This includes oil or grease spills. What can I do in the bathroom?  Use night lights.  Install grab bars by the toilet and in the tub and shower. Do not use towel bars as grab bars.  Use non-skid mats or decals in the tub or shower.  If you need to sit down in the shower, use a plastic, non-slip stool.  Keep the floor  dry. Clean up any water that spills on the floor as soon as it happens.  Remove soap buildup in the tub or shower regularly.  Attach bath mats securely with double-sided non-slip rug tape.  Do not have throw rugs and other things on the floor that can make you trip. What can I do in the bedroom?  Use night lights.  Make sure that you have a light by your bed that is easy to reach.  Do not use any sheets or blankets that are too big for your bed. They should not hang down onto the floor.  Have a firm chair that has side arms. You can use this for support while you get dressed.  Do not have throw rugs and other things on the floor that can make you trip. What can I do in the kitchen?  Clean up any spills right away.  Avoid walking on wet floors.  Keep items that you use a lot in easy-to-reach places.  If you need to reach something above you, use a strong step stool that has a grab bar.  Keep electrical cords out of the way.  Do not use floor polish or wax that makes floors slippery. If you must use wax,  use non-skid floor wax.  Do not have throw rugs and other things on the floor that can make you trip. What can I do with my stairs?  Do not leave any items on the stairs.  Make sure that there are handrails on both sides of the stairs and use them. Fix handrails that are broken or loose. Make sure that handrails are as long as the stairways.  Check any carpeting to make sure that it is firmly attached to the stairs. Fix any carpet that is loose or worn.  Avoid having throw rugs at the top or bottom of the stairs. If you do have throw rugs, attach them to the floor with carpet tape.  Make sure that you have a light switch at the top of the stairs and the bottom of the stairs. If you do not have them, ask someone to add them for you. What else can I do to help prevent falls?  Wear shoes that:  Do not have high heels.  Have rubber bottoms.  Are comfortable and fit you  well.  Are closed at the toe. Do not wear sandals.  If you use a stepladder:  Make sure that it is fully opened. Do not climb a closed stepladder.  Make sure that both sides of the stepladder are locked into place.  Ask someone to hold it for you, if possible.  Clearly mark and make sure that you can see:  Any grab bars or handrails.  First and last steps.  Where the edge of each step is.  Use tools that help you move around (mobility aids) if they are needed. These include:  Canes.  Walkers.  Scooters.  Crutches.  Turn on the lights when you go into a dark area. Replace any light bulbs as soon as they burn out.  Set up your furniture so you have a clear path. Avoid moving your furniture around.  If any of your floors are uneven, fix them. If there are any pets around you, be aware of where they are.  Review your medicines with your doctor. Some medicines can make you feel dizzy. This can increase your chance of falling. Ask your doctor what other things that you can do to help prevent falls. This information is not intended to replace advice given to you by your health care provider. Make sure you discuss any questions you have with your health care provider. Document Released: 01/28/2009 Document Revised: 09/09/2015 Document Reviewed: 05/08/2014 Elsevier Interactive Patient Education  2017 [employer].

## 2019-10-07 ENCOUNTER — Telehealth: Payer: Self-pay

## 2019-10-07 ENCOUNTER — Other Ambulatory Visit: Payer: Self-pay

## 2019-10-07 ENCOUNTER — Other Ambulatory Visit (INDEPENDENT_AMBULATORY_CARE_PROVIDER_SITE_OTHER): Payer: Medicare Other

## 2019-10-07 DIAGNOSIS — E119 Type 2 diabetes mellitus without complications: Secondary | ICD-10-CM | POA: Diagnosis not present

## 2019-10-07 DIAGNOSIS — E785 Hyperlipidemia, unspecified: Secondary | ICD-10-CM

## 2019-10-07 LAB — COMPREHENSIVE METABOLIC PANEL
ALT: 17 U/L (ref 0–53)
AST: 20 U/L (ref 0–37)
Albumin: 4.5 g/dL (ref 3.5–5.2)
Alkaline Phosphatase: 55 U/L (ref 39–117)
BUN: 23 mg/dL (ref 6–23)
CO2: 30 mEq/L (ref 19–32)
Calcium: 9.9 mg/dL (ref 8.4–10.5)
Chloride: 101 mEq/L (ref 96–112)
Creatinine, Ser: 1.26 mg/dL (ref 0.40–1.50)
GFR: 57.02 mL/min — ABNORMAL LOW (ref >60.00)
Glucose, Bld: 132 mg/dL — ABNORMAL HIGH (ref 70–99)
Potassium: 4.3 mEq/L (ref 3.5–5.1)
Sodium: 140 mEq/L (ref 135–145)
Total Bilirubin: 0.7 mg/dL (ref 0.2–1.2)
Total Protein: 6.6 g/dL (ref 6.0–8.3)

## 2019-10-07 LAB — LIPID PANEL
Cholesterol: 145 mg/dL (ref 0–200)
HDL: 45.8 mg/dL (ref >39.00)
LDL Cholesterol: 68 mg/dL (ref 0–99)
NonHDL: 99.45
Total CHOL/HDL Ratio: 3
Triglycerides: 156 mg/dL — ABNORMAL HIGH (ref 0.0–149.0)
VLDL: 31.2 mg/dL (ref 0.0–40.0)

## 2019-10-07 LAB — HEMOGLOBIN A1C: Hgb A1c MFr Bld: 7.1 % — ABNORMAL HIGH (ref 4.6–6.5)

## 2019-10-07 NOTE — Telephone Encounter (Cosign Needed)
Left message on voicemail  Pt had DM eye exam 09/05/2019 per health advisor notes--need to know where pt went to have exam so that we can request a copy

## 2019-10-08 ENCOUNTER — Other Ambulatory Visit: Payer: Self-pay | Admitting: Primary Care

## 2019-10-08 DIAGNOSIS — F339 Major depressive disorder, recurrent, unspecified: Secondary | ICD-10-CM

## 2019-10-09 ENCOUNTER — Encounter: Payer: Self-pay | Admitting: Primary Care

## 2019-10-09 ENCOUNTER — Ambulatory Visit (INDEPENDENT_AMBULATORY_CARE_PROVIDER_SITE_OTHER): Payer: Medicare Other | Admitting: Primary Care

## 2019-10-09 ENCOUNTER — Other Ambulatory Visit: Payer: Self-pay

## 2019-10-09 VITALS — BP 118/72 | HR 80 | Temp 96.2°F | Ht 71.0 in | Wt 230.5 lb

## 2019-10-09 DIAGNOSIS — E119 Type 2 diabetes mellitus without complications: Secondary | ICD-10-CM

## 2019-10-09 DIAGNOSIS — J439 Emphysema, unspecified: Secondary | ICD-10-CM

## 2019-10-09 DIAGNOSIS — I25119 Atherosclerotic heart disease of native coronary artery with unspecified angina pectoris: Secondary | ICD-10-CM

## 2019-10-09 DIAGNOSIS — Z7902 Long term (current) use of antithrombotics/antiplatelets: Secondary | ICD-10-CM | POA: Insufficient documentation

## 2019-10-09 DIAGNOSIS — Z0000 Encounter for general adult medical examination without abnormal findings: Secondary | ICD-10-CM | POA: Diagnosis not present

## 2019-10-09 DIAGNOSIS — F339 Major depressive disorder, recurrent, unspecified: Secondary | ICD-10-CM

## 2019-10-09 DIAGNOSIS — I10 Essential (primary) hypertension: Secondary | ICD-10-CM

## 2019-10-09 DIAGNOSIS — Z1211 Encounter for screening for malignant neoplasm of colon: Secondary | ICD-10-CM | POA: Diagnosis not present

## 2019-10-09 DIAGNOSIS — G4733 Obstructive sleep apnea (adult) (pediatric): Secondary | ICD-10-CM

## 2019-10-09 DIAGNOSIS — N182 Chronic kidney disease, stage 2 (mild): Secondary | ICD-10-CM

## 2019-10-09 DIAGNOSIS — E785 Hyperlipidemia, unspecified: Secondary | ICD-10-CM

## 2019-10-09 DIAGNOSIS — K219 Gastro-esophageal reflux disease without esophagitis: Secondary | ICD-10-CM

## 2019-10-09 DIAGNOSIS — I4819 Other persistent atrial fibrillation: Secondary | ICD-10-CM

## 2019-10-09 DIAGNOSIS — Z7901 Long term (current) use of anticoagulants: Secondary | ICD-10-CM

## 2019-10-09 DIAGNOSIS — K635 Polyp of colon: Secondary | ICD-10-CM | POA: Diagnosis not present

## 2019-10-09 DIAGNOSIS — C61 Malignant neoplasm of prostate: Secondary | ICD-10-CM

## 2019-10-09 DIAGNOSIS — Z950 Presence of cardiac pacemaker: Secondary | ICD-10-CM

## 2019-10-09 MED ORDER — CITALOPRAM HYDROBROMIDE 20 MG PO TABS: 20.0000 mg | ORAL_TABLET | Freq: Every day | ORAL | 1 refills | Status: DC

## 2019-10-09 NOTE — Assessment & Plan Note (Signed)
Due for repeat colonoscopy in March 2021, he was never contacted.  Referral placed to GI for follow-up colonoscopy.

## 2019-10-09 NOTE — Assessment & Plan Note (Signed)
Immunizations up-to-date.  He denies ever having chickenpox as a child. PSA up-to-date. Colonoscopy overdue, referral placed to GI. Encouraged a healthy diet and regular exercise. Exam today stable. Labs reviewed.

## 2019-10-09 NOTE — Progress Notes (Signed)
Subjective:    Patient ID: [name] Cook, male    DOB: May 13, 1950, 69 y.o.   MRN: [id number]  HPI  This visit occurred during the SARS-CoV-2 public health emergency.  Safety protocols were in place, including screening questions prior to the visit, additional usage of staff PPE, and extensive cleaning of exam room while observing appropriate contact time as indicated for disinfecting solutions.   [name] Cook is a 69 year old male who presents today for complete physical.  Immunizations: -Tetanus: Completed in 2012 -Influenza: Completed last season  -Shingles: Never had chicken pox -Pneumonia: Completed Prevnar in 2019, Pneumovax in 2012 -Covid-19: Completed series   Diet: He endorses a fair diet.  Exercise: Walks daily   Eye exam: UTD Dental exam: No recent exam   Colonoscopy: Completed in September 2020, due in March 2021 PSA: 0.01 in 2021 Hep C Screen: Negative  BP Readings from Last 3 Encounters:  10/09/19 118/72  08/13/19 90/60  08/12/19 120/80   He would also like to mention depression. Symptoms of feeling down, not feeling happy, feeling tired, feeling anxious. He is compliant to Lexapro and Wellbutrin daily, doesn't feel as though they are helpful. Also with decreased sexual drive. Denies SI/HI. He would be interested in changing medications.   Review of Systems  Constitutional: Negative for unexpected weight change.  HENT: Negative for rhinorrhea.   Respiratory: Negative for cough and shortness of breath.   Cardiovascular: Negative for chest pain.  Gastrointestinal: Negative for constipation and diarrhea.  Genitourinary: Negative for difficulty urinating.  Musculoskeletal: Positive for arthralgias.       Chronic right plantar foot pain  Skin: Negative for rash.  Allergic/Immunologic: Negative for environmental allergies.  Neurological: Negative for dizziness, numbness and headaches.  Psychiatric/Behavioral: Negative for sleep disturbance and suicidal ideas.        See HPI       Past Medical History:  Diagnosis Date  . Arthritis    "knees and lower back" (03/14/2013)  . Atrial flutter ([place])    radiofrequency ablation in 2001  . CAD (coronary artery disease)    a. Nonobstructive. Cardiac cath in 2001-50% mid RI, normal LM, LAD, RCA b. cath 10/16/2014 95% mid RCA treated with DES, 99% ost D1 medical management due to small aneurysmal segment  . Chronic anticoagulation    chronic Coumadin anticoagulation  . Chronic obstructive pulmonary disease ([place]) 04/20/2011  . Diabetes mellitus, type 2 ([place])   . GERD (gastroesophageal reflux disease)   . Hyperlipidemia   . Hypertension    with hypertensive heart disease  . Obesity   . Persistent atrial fibrillation ([facility])    recurrent atrial flutter since 2001 s/p DCCVs, multiple failed AADs, h/o tachy-mediated cardiomyopathy  . Shortness of breath    "can come on at any time" (03/14/2013)  . Sleep apnea    "dx'd; couldn't wear the mask" (03/14/2013)  . Tobacco abuse      Social History   Socioeconomic History  . Marital status: Widowed    Spouse name: Not on file  . Number of children: 1  . Years of education: Not on file  . Highest education level: Not on file  Occupational History  . Occupation: Merchandiser, retail: UNEMPLOYED  Tobacco Use  . Smoking status: Former Smoker    Packs/day: 1.00    Years: 42.00    Pack years: 42.00    Types: Cigarettes    Quit date: 12/31/2011    Years since quitting: 7.7  .  Smokeless tobacco: Never Used  Vaping Use  . Vaping Use: Never used  Substance and Sexual Activity  . Alcohol use: Not Currently    Comment: 03/14/2013 "stopped drinking back in 2002; never had problem w/it"  . Drug use: No  . Sexual activity: Not Currently  Other Topics Concern  . Not on file  Social History Narrative   Single.   Retired.    1 son, deceased.    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   Enjoys playing on the computer.         Social Determinants of Health   Financial Resource Strain: Low Risk   . Difficulty of Paying Living Expenses: Not hard at all  Food Insecurity: No Food Insecurity  . Worried About Charity fundraiser in the Last Year: Never true  . Ran Out of Food in the Last Year: Never true  Transportation Needs: No Transportation Needs  . Lack of Transportation (Medical): No  . Lack of Transportation (Non-Medical): No  Physical Activity: Inactive  . Days of Exercise per Week: 0 days  . Minutes of Exercise per Session: 0 min  Stress: No Stress Concern Present  . Feeling of Stress : Not at all  Social Connections:   . Frequency of Communication with Friends and Family:   . Frequency of Social Gatherings with Friends and Family:   . Attends Religious Services:   . Active Member of Clubs or Organizations:   . Attends Archivist Meetings:   Marland Kitchen Marital Status:   Intimate Partner Violence: Not At Risk  . Fear of Current or Ex-Partner: No  . Emotionally Abused: No  . Physically Abused: No  . Sexually Abused: No    Past Surgical History:  Procedure Laterality Date  . ATRIAL FLUTTER ABLATION  2002   atrial flutter; subsequently developed atrial fibrillation  . AV NODE ABLATION  01/24/2013  . CARDIAC CATHETERIZATION  2002  . CARDIAC CATHETERIZATION N/A 10/16/2014   Procedure: Left Heart Cath and Coronary Angiography;  Surgeon: [name], MD;  Location: [place] [facility];  Service: Cardiovascular;  Laterality: N/A;  . CARDIOVERSION  05/31/2011   Procedure: CARDIOVERSION;  Surgeon: Cristopher Estimable. Lattie Haw, MD;  Location: [facility];  Service: Cardiovascular;  Laterality: N/A;  . CARPAL TUNNEL RELEASE Left 1980's  . COLONOSCOPY WITH PROPOFOL N/A 12/30/2018   Procedure: COLONOSCOPY WITH PROPOFOL;  Surgeon: Jonathon Bellows, MD;  [facility];  Service: Gastroenterology;  Laterality: N/A;  . INSERT / REPLACE / REMOVE PACEMAKER  01/24/2013    Medtronic Adapta L dual-chamber pacemaker, serial  number NWE [id number]   . LEFT HEART CATH AND CORONARY ANGIOGRAPHY N/A 06/07/2018   Procedure: LEFT HEART CATH AND CORONARY ANGIOGRAPHY;  Surgeon: [name], MD;  Location: [place] [facility];  Service: Cardiovascular;  Laterality: N/A;  . LEFT HEART CATHETERIZATION WITH CORONARY ANGIOGRAM N/A 03/17/2013   Procedure: LEFT HEART CATHETERIZATION WITH CORONARY ANGIOGRAM;  Surgeon: Burnell Blanks, MD; LAD mild dz, D1 branch 100%, inferior branch 99%, CFX OK, RCA 50%, EF 65%    . LOOP RECORDER IMPLANT  2002  . PERMANENT PACEMAKER INSERTION N/A 01/24/2013   Procedure: PERMANENT PACEMAKER INSERTION;  Surgeon: [name], MD;  Location[facility] C[facility];  Service: Cardiovascular;  Laterality: N/A;  . TIBIAL TUBERCLERPLASTY  ~ 2003    Family History  Problem Relation Age of Onset  . Alzheimer's disease Mother   . Osteoporosis Mother     No  Known Allergies  Current Outpatient Medications on File Prior to Visit  Medication Sig Dispense Refill  . buPROPion (ZYBAN) 150 MG 12 hr tablet TAKE 1 TABLET BY MOUTH TWICE A DAY 180 tablet 1  . clopidogrel (PLAVIX) 75 MG tablet TAKE 1 TABLET BY MOUTH EVERY DAY 90 tablet 3  . diclofenac Sodium (VOLTAREN) 1 % GEL Apply 2 g topically 4 (four) times daily. 50 g 0  . escitalopram (LEXAPRO) 20 MG tablet TAKE 1 TABLET (20 MG TOTAL) BY MOUTH DAILY. FOR DEPRESSION. 90 tablet 1  . fish oil-omega-3 fatty acids 1000 MG capsule Take 1 g by mouth daily.      . fluticasone (FLONASE) 50 MCG/ACT nasal spray SMARTSIG:1 Spray(s) Both Nares Daily PRN    . furosemide (LASIX) 20 MG tablet Take 1 tablet (20 mg total) by mouth daily. 90 tablet 3  . gabapentin (NEURONTIN) 300 MG capsule Take 1 capsule (300 mg total) by mouth 3 (three) times daily. 270 capsule 0  . glucose blood (ONE TOUCH ULTRA TEST) test strip USE TO TEST UP TO 4 TIMES DAILY 100 each 2  . guaiFENesin-codeine 100-10 MG/5ML syrup Take 5 mLs by mouth 3 (three) times daily as needed for cough. 120 mL 0  .  isosorbide mononitrate (IMDUR) 60 MG 24 hr tablet Take 1 tablet (60 mg total) by mouth daily. 90 tablet 1  . JARDIANCE 10 MG TABS tablet TAKE 1 TABLET BY MOUTH EVERY MORNING FOR DIABETES. 90 tablet 1  . lisinopril (ZESTRIL) 5 MG tablet Take 1 tablet (5 mg total) by mouth daily. 90 tablet 0  . metFORMIN (GLUCOPHAGE) 500 MG tablet TAKE 1 TABLET BY MOUTH TWICE DAILY WITH A MEAL 180 tablet 1  . metoprolol succinate (TOPROL-XL) 25 MG 24 hr tablet TAKE 1 TABLET BY MOUTH EVERY DAY 90 tablet 3  . neomycin-polymyxin-hydrocortisone (CORTISPORIN) OTIC solution Place 3 drops into both ears 3 (three) times daily. 10 mL 0  . nitroGLYCERIN (NITROSTAT) 0.4 MG SL tablet Place 1 tablet (0.4 mg total) under the tongue every 5 (five) minutes as needed for chest pain. 25 tablet 0  . pantoprazole (PROTONIX) 40 MG tablet TAKE 1 TABLET (40 MG TOTAL) BY MOUTH DAILY FOR HEARTBURN. 90 tablet 1  . rosuvastatin (CRESTOR) 20 MG tablet TAKE 1 TABLET BY MOUTH EVERY DAY IN THE EVENING FOR CHOLESTEROL 90 tablet 1  . sitaGLIPtin (JANUVIA) 100 MG tablet TAKE 1 TABLET BY MOUTH ONCE DAILY FOR DIABETES. 90 tablet 1  . tamsulosin (FLOMAX) 0.4 MG CAPS capsule TAKE 1 CAPSULE (0.4 MG TOTAL) BY MOUTH 2 (TWO) TIMES DAILY. 180 capsule 3  . warfarin (COUMADIN) 5 MG tablet TAKE 1 TABLET ON MONDAY, TAKE HALF TAB EVERY DAY EXCEPT FOR MONDAY. 90 tablet 0   No current facility-administered medications on file prior to visit.    BP 118/72   Pulse 80   Temp (!) 96.2 F (35.7 C) (Temporal)   Ht 5\' 11"  (1.803 m)   Wt 230 lb 8 oz (104.6 kg)   SpO2 98%   BMI 32.15 kg/m    Objective:   Physical Exam  Constitutional: He is oriented to person, place, and time.  HENT:  Right Ear: Tympanic membrane and ear canal normal.  Left Ear: Tympanic membrane and ear canal normal.  Eyes: Pupils are equal, round, and reactive to light.  Cardiovascular: Normal rate and regular rhythm.  Respiratory: Effort normal and breath sounds normal.  GI: Soft. Bowel  sounds are normal. There is no abdominal tenderness.  Musculoskeletal:        General: Normal range of motion.     Cervical back: Neck supple.     Comments: Tenderness to plantar region of right foot with mild palpation. Tenderness with dorsoflexion and plantar flexion. No other abnormality.   Neurological: He is alert and oriented to person, place, and time. No cranial nerve deficit.  Reflex Scores:      Patellar reflexes are 2+ on the right side and 2+ on the left side. Skin: Skin is warm and dry.           Assessment & Plan:

## 2019-10-09 NOTE — Assessment & Plan Note (Signed)
Recent PSA undetectable.  Following with urology and oncology.  Continue to monitor.

## 2019-10-09 NOTE — Assessment & Plan Note (Signed)
Patient does not feel well managed, active daily symptoms.  Discussed options for treatment, we will start by changing from Lexapro to Celexa.  Continue bupropion.  We will see him back in 6 weeks for a follow-up visit.

## 2019-10-09 NOTE — Assessment & Plan Note (Addendum)
Stable, INR managed by our office. Recent INR level too low, he will follow-up for repeat INR check as scheduled.  Continue warfarin, metoprolol succinate.

## 2019-10-09 NOTE — Assessment & Plan Note (Signed)
Overall stable, managed on ACE inhibitor.  Continue same.

## 2019-10-09 NOTE — Assessment & Plan Note (Signed)
Recent LDL under good control, continue rosuvastatin.

## 2019-10-09 NOTE — Assessment & Plan Note (Signed)
Asymptomatic, following with cardiology. Continue clopidogrel, beta-blocker, blood pressure and diabetes control.

## 2019-10-09 NOTE — Assessment & Plan Note (Signed)
Compliant to scheduled INR checks.  Recent INR 1.9 which is too low.  Continue warfarin as prescribed, follow-up as scheduled for repeat INR check.

## 2019-10-09 NOTE — Assessment & Plan Note (Signed)
Declines further evaluation, cannot tolerate CPAP in the past.  Will readdress at next visit.

## 2019-10-09 NOTE — Assessment & Plan Note (Signed)
Well-controlled in the office today, continue lisinopril, isosorbide, metoprolol.

## 2019-10-09 NOTE — Assessment & Plan Note (Signed)
Recent remote pacemaker check reviewed.  Following with cardiology.

## 2019-10-09 NOTE — Assessment & Plan Note (Signed)
Well-controlled on pantoprazole.  Continue same.

## 2019-10-09 NOTE — Assessment & Plan Note (Signed)
Exam today unremarkable. Some exertional shortness of breath, overall feels well.  Continue to monitor.

## 2019-10-09 NOTE — Patient Instructions (Signed)
Stop taking escitalopram (Lexapro) for depression. Start taking citalopram (Celexa) for depression.  Continue taking bupropion 150 mg twice daily for depression.  You will be contacted regarding your referral to GI for the colonoscopy.  Please let us know if you have not been contacted within two weeks.   Follow up with Dr. Lorelei Pont for your foot.  Start exercising. You should be getting 150 minutes of exercise weekly.  It's important to improve your diet by reducing consumption of fast food, fried food, processed snack foods, sugary drinks. Increase consumption of fresh vegetables and fruits, whole grains, water.  Ensure you are drinking 64 ounces of water daily.  Please schedule a follow up visit for 6 weeks for depression.   It was a pleasure to see you today!   Preventive Care 38 Years and Older, Male Preventive care refers to lifestyle choices and visits with your health care provider that can promote health and wellness. This includes:  A yearly physical exam. This is also called an annual well check.  Regular dental and eye exams.  Immunizations.  Screening for certain conditions.  Healthy lifestyle choices, such as diet and exercise. What can I expect for my preventive care visit? Physical exam Your health care provider will check:  Height and weight. These may be used to calculate body mass index (BMI), which is a measurement that tells if you are at a healthy weight.  Heart rate and blood pressure.  Your skin for abnormal spots. Counseling Your health care provider may ask you questions about:  Alcohol, tobacco, and drug use.  Emotional well-being.  Home and relationship well-being.  Sexual activity.  Eating habits.  History of falls.  Memory and ability to understand (cognition).  Work and work Statistician. What immunizations do I need?  Influenza (flu) vaccine  This is recommended every year. Tetanus, diphtheria, and pertussis (Tdap)  vaccine  You may need a Td booster every 10 years. Varicella (chickenpox) vaccine  You may need this vaccine if you have not already been vaccinated. Zoster (shingles) vaccine  You may need this after age 67. Pneumococcal conjugate (PCV13) vaccine  One dose is recommended after age 67. Pneumococcal polysaccharide (PPSV23) vaccine  One dose is recommended after age 67. Measles, mumps, and rubella (MMR) vaccine  You may need at least one dose of MMR if you were born in 1957 or later. You may also need a second dose. Meningococcal conjugate (MenACWY) vaccine  You may need this if you have certain conditions. Hepatitis A vaccine  You may need this if you have certain conditions or if you travel or work in places where you may be exposed to hepatitis A. Hepatitis B vaccine  You may need this if you have certain conditions or if you travel or work in places where you may be exposed to hepatitis B. Haemophilus influenzae type b (Hib) vaccine  You may need this if you have certain conditions. You may receive vaccines as individual doses or as more than one vaccine together in one shot (combination vaccines). Talk with your health care provider about the risks and benefits of combination vaccines. What tests do I need? Blood tests  Lipid and cholesterol levels. These may be checked every 5 years, or more frequently depending on your overall health.  Hepatitis C test.  Hepatitis B test. Screening  Lung cancer screening. You may have this screening every year starting at age 35 if you have a 30-pack-year history of smoking and currently smoke or have quit  within the past 15 years.  Colorectal cancer screening. All adults should have this screening starting at age 67 and continuing until age 67. Your health care provider may recommend screening at age 31 if you are at increased risk. You will have tests every 1-10 years, depending on your results and the type of screening  test.  Prostate cancer screening. Recommendations will vary depending on your family history and other risks.  Diabetes screening. This is done by checking your blood sugar (glucose) after you have not eaten for a while (fasting). You may have this done every 1-3 years.  Abdominal aortic aneurysm (AAA) screening. You may need this if you are a current or former smoker.  Sexually transmitted disease (STD) testing. Follow these instructions at home: Eating and drinking  Eat a diet that includes fresh fruits and vegetables, whole grains, lean protein, and low-fat dairy products. Limit your intake of foods with high amounts of sugar, saturated fats, and salt.  Take vitamin and mineral supplements as recommended by your health care provider.  Do not drink alcohol if your health care provider tells you not to drink.  If you drink alcohol: ? Limit how much you have to 0-2 drinks a day. ? Be aware of how much alcohol is in your drink. In the [place] one drink equals one 12 oz bottle of beer (355 mL), one 5 oz glass of wine (148 mL), or one 1 oz glass of hard liquor (44 mL). Lifestyle  Take daily care of your teeth and gums.  Stay active. Exercise for at least 30 minutes on 5 or more days each week.  Do not use any products that contain nicotine or tobacco, such as cigarettes, e-cigarettes, and chewing tobacco. If you need help quitting, ask your health care provider.  If you are sexually active, practice safe sex. Use a condom or other form of protection to prevent STIs (sexually transmitted infections).  Talk with your health care provider about taking a low-dose aspirin or statin. What's next?  Visit your health care provider once a year for a well check visit.  Ask your health care provider how often you should have your eyes and teeth checked.  Stay up to date on all vaccines. This information is not intended to replace advice given to you by your health care provider. Make sure you  discuss any questions you have with your health care provider. Document Revised: 03/28/2018 Document Reviewed: 03/28/2018 Elsevier Patient Education  2020 [employer].

## 2019-10-09 NOTE — Assessment & Plan Note (Signed)
A1c slightly above goal, especially given his cardiac history.  He would like to work on some exercise and lifestyle changes.  Continue Metformin, Jardiance, Januvia. Managed on statin and ACE inhibitor. Foot exam today. Eye exam up-to-date. Vaccination up-to-date.  Follow-up in 6 months for diabetes check.

## 2019-10-10 ENCOUNTER — Encounter: Payer: Self-pay | Admitting: Family Medicine

## 2019-10-10 ENCOUNTER — Ambulatory Visit (INDEPENDENT_AMBULATORY_CARE_PROVIDER_SITE_OTHER): Payer: Medicare Other | Admitting: Family Medicine

## 2019-10-10 VITALS — BP 100/70 | HR 88 | Temp 97.5°F | Ht 71.0 in | Wt 229.5 lb

## 2019-10-10 DIAGNOSIS — M79671 Pain in right foot: Secondary | ICD-10-CM | POA: Diagnosis not present

## 2019-10-10 DIAGNOSIS — M170 Bilateral primary osteoarthritis of knee: Secondary | ICD-10-CM

## 2019-10-10 NOTE — Patient Instructions (Signed)
If you are still having arch pain in 6-8 weeks, call and set up an orthotics appointment.   I make them at 2 PM and schedule 40 minutes to make them. (tell the staff on the phone)

## 2019-10-10 NOTE — Progress Notes (Signed)
[name] Lintner T. Athony Coppa, MD, [facility]  Primary Care and Sports Medicine [facility] [place], [address]  Phone: [phone]  FAX: [phone]  [name] Cook - 69 y.o. male  MRN [id number]  Date of Birth: August 04, 1950  Date: 10/10/2019  PCP: [name], NP  Referral: [name], NP  Chief Complaint  Patient presents with  . Foot Pain    Bottom of Right Foot    This visit occurred during the SARS-CoV-2 public health emergency.  Safety protocols were in place, including screening questions prior to the visit, additional usage of staff PPE, and extensive cleaning of exam room while observing appropriate contact time as indicated for disinfecting solutions.   Subjective:   [name] Cook is a 69 y.o. very pleasant male patient with Body mass index is 32.01 kg/m. who presents with the following:  I saw him 2 months ago, and at that time he was having some pain in his heel.  On his last office visit.  I did place him in a pneumatic fracture boot, as short.  I had wanted to see him back in 1 month, but he presents today 2 months after initial evaluation.  At this point the area of prior pain is completely resolved and nontender. Now mostly on the bottom of his arch Sports insoles and R medial post  Outward turned gain  He also has some longstanding bilateral knee pain.  This is both on the left and the right.  He is on antiplatelet therapy on Plavix.  I have done some bilateral knee injections with steroids in the past with diminishing returns. HE WANTS B VISCO INJECTIONS  08/13/2019 Last OV with Owens Loffler, MD  He is a very nice gentleman who presents with some ongoing right-sided ankle pain.  4 weeks ago he tripped with his dog and twisted his ankle.  He is had some lateral ankle pain after an inversion injury.   He is on Coumadin as well as Plavix chronically.   Plain films dated July 17, 2019 are  independently reviewed by myself.  There is no evidence of fracture or dislocation, specifically no ankle fracture is noted. Electronically Signed  By: Owens Loffler, MD On: 08/13/2019  9:40 AM EDT    He was discharged from the ER with some Voltaren gel, recommended Tylenol and rice.   He is here for follow-up regarding this today.   He continues to have pain in the right heel/calcaneus region.  He tells me that this is been the predominant area of pain throughout and initially when he injured his foot and ankle on an inversion injury.  He was given an ASO ankle brace in the emergency room, and this actually made his symptoms worse.   Review of Systems is noted in the HPI, as appropriate   Objective:   BP 100/70   Pulse 88   Temp (!) 97.5 F (36.4 C) (Temporal)   Ht 5\' 11"  (1.803 m)   Wt 229 lb 8 oz (104.1 kg)   SpO2 98%   BMI 32.01 kg/m    GEN: No acute distress; alert,appropriate. PULM: Breathing comfortably in no respiratory distress PSYCH: Normally interactive.    1 foot and ankle exam patient does have notable hindfoot valgus.  He also has some breakdown of the longitudinal as well as transverse arch.  He is nontender at the calcaneus, Achilles, medial and lateral malleoli, peroneal, posterior tibialis tendons as  well as the remainder of the midfoot.  Nontender at the fifth and throughout the metatarsal shafts.  He does have pain on the bottom of the foot more distally at this point.  He also has some bilateral tenderness in both knees medial greater than lateral joint lines.  He also has some restriction of motion in modest effusion on the right.  Radiology: No results found.  Assessment and Plan:     ICD-10-CM   1. Primary osteoarthritis of both knees  M17.0   2. Acute foot pain, right  M79.671    Foot and ankle pain is improving, but he does have some pain now on the plantar aspect of the foot more distally.  Placed him in a sport insole and then put a medial  post to hopefully correct for his hindfoot valgus in take some stress off of the foot and ankle.  Does have some bilateral knee pain and he has had some diminishing returns with some steroid injections, so I think that it is entirely reasonable to do a trial of viscosupplementation, and we will check this out for the patient.   Follow-up: No follow-ups on file.  No orders of the defined types were placed in this encounter.  Medications Discontinued During This Encounter  Medication Reason  . buPROPion (ZYBAN) 150 MG 12 hr tablet Change in therapy  . guaiFENesin-codeine 100-10 MG/5ML syrup Completed Course  . neomycin-polymyxin-hydrocortisone (CORTISPORIN) OTIC solution Completed Course   No orders of the defined types were placed in this encounter.   Signed,  Maud Deed. Kimberleigh Mehan, MD   Outpatient Encounter Medications as of 10/10/2019  Medication Sig  . citalopram (CELEXA) 20 MG tablet Take 1 tablet (20 mg total) by mouth daily. For depression.  . clopidogrel (PLAVIX) 75 MG tablet TAKE 1 TABLET BY MOUTH EVERY DAY  . diclofenac Sodium (VOLTAREN) 1 % GEL Apply 2 g topically 4 (four) times daily.  Marland Kitchen escitalopram (LEXAPRO) 20 MG tablet TAKE 1 TABLET (20 MG TOTAL) BY MOUTH DAILY. FOR DEPRESSION.  . fish oil-omega-3 fatty acids 1000 MG capsule Take 1 g by mouth daily.    . fluticasone (FLONASE) 50 MCG/ACT nasal spray SMARTSIG:1 Spray(s) Both Nares Daily PRN  . furosemide (LASIX) 20 MG tablet Take 1 tablet (20 mg total) by mouth daily.  Marland Kitchen gabapentin (NEURONTIN) 300 MG capsule Take 1 capsule (300 mg total) by mouth 3 (three) times daily.  Marland Kitchen glucose blood (ONE TOUCH ULTRA TEST) test strip USE TO TEST UP TO 4 TIMES DAILY  . isosorbide mononitrate (IMDUR) 60 MG 24 hr tablet Take 1 tablet (60 mg total) by mouth daily.  Marland Kitchen JARDIANCE 10 MG TABS tablet TAKE 1 TABLET BY MOUTH EVERY MORNING FOR DIABETES.  Marland Kitchen lisinopril (ZESTRIL) 5 MG tablet Take 1 tablet (5 mg total) by mouth daily.  . metFORMIN  (GLUCOPHAGE) 500 MG tablet TAKE 1 TABLET BY MOUTH TWICE DAILY WITH A MEAL  . metoprolol succinate (TOPROL-XL) 25 MG 24 hr tablet TAKE 1 TABLET BY MOUTH EVERY DAY  . nitroGLYCERIN (NITROSTAT) 0.4 MG SL tablet Place 1 tablet (0.4 mg total) under the tongue every 5 (five) minutes as needed for chest pain.  . pantoprazole (PROTONIX) 40 MG tablet TAKE 1 TABLET (40 MG TOTAL) BY MOUTH DAILY FOR HEARTBURN.  . rosuvastatin (CRESTOR) 20 MG tablet TAKE 1 TABLET BY MOUTH EVERY DAY IN THE EVENING FOR CHOLESTEROL  . sitaGLIPtin (JANUVIA) 100 MG tablet TAKE 1 TABLET BY MOUTH ONCE DAILY FOR DIABETES.  . tamsulosin (FLOMAX)  0.4 MG CAPS capsule TAKE 1 CAPSULE (0.4 MG TOTAL) BY MOUTH 2 (TWO) TIMES DAILY.  Marland Kitchen warfarin (COUMADIN) 5 MG tablet TAKE 1 TABLET ON MONDAY, TAKE HALF TAB EVERY DAY EXCEPT FOR MONDAY.  . [DISCONTINUED] buPROPion (ZYBAN) 150 MG 12 hr tablet TAKE 1 TABLET BY MOUTH TWICE A DAY  . [DISCONTINUED] guaiFENesin-codeine 100-10 MG/5ML syrup Take 5 mLs by mouth 3 (three) times daily as needed for cough.  . [DISCONTINUED] neomycin-polymyxin-hydrocortisone (CORTISPORIN) OTIC solution Place 3 drops into both ears 3 (three) times daily.   No facility-administered encounter medications on file as of 10/10/2019.

## 2019-10-14 ENCOUNTER — Telehealth: Payer: Self-pay

## 2019-10-14 NOTE — Telephone Encounter (Signed)
Faxed paperwork with insurance information to [phone]

## 2019-10-14 NOTE — Telephone Encounter (Signed)
From: Owens Loffler, MD  Sent: 10/10/2019 10:49 AM EDT  To: Diamond Nickel, RN, Randall An, RN  Subject: [name] wanted to get approved for B visco injections   Any brand is fine.   I do prefer 1 shot if possible (like monovisc or synvisc-1, but any that are approved is fine)

## 2019-10-15 NOTE — Telephone Encounter (Signed)
Visco benefits are back:  Patient will require a prior authorization but after prior auth visco injections will be covered 100% after he meets his out of pocket expense.  Approximate out of pocket with his insurance; however is 7500.00. Patient has only met $494.62 of this according to our reports.  Patient can contact his insurance to obtain an exact amount.  Shots are approx 2700 per injection which may be too costly for patient.    I have placed a benefit summary request for Durolane and Gelsyn just to see if either of these options would be cheaper.   Waiting for a response.

## 2019-10-22 ENCOUNTER — Other Ambulatory Visit: Payer: Self-pay | Admitting: Primary Care

## 2019-10-22 DIAGNOSIS — E785 Hyperlipidemia, unspecified: Secondary | ICD-10-CM

## 2019-10-24 NOTE — Telephone Encounter (Signed)
Pharmacy benefits have been put started on the myvisco portal, just waiting for provider signature.

## 2019-10-27 ENCOUNTER — Telehealth: Payer: Self-pay | Admitting: *Deleted

## 2019-10-27 ENCOUNTER — Telehealth (INDEPENDENT_AMBULATORY_CARE_PROVIDER_SITE_OTHER): Payer: Self-pay | Admitting: Gastroenterology

## 2019-10-27 ENCOUNTER — Other Ambulatory Visit: Payer: Self-pay

## 2019-10-27 DIAGNOSIS — K635 Polyp of colon: Secondary | ICD-10-CM

## 2019-10-27 DIAGNOSIS — Z1211 Encounter for screening for malignant neoplasm of colon: Secondary | ICD-10-CM

## 2019-10-27 MED ORDER — NA SULFATE-K SULFATE-MG SULF 17.5-3.13-1.6 GM/177ML PO SOLN: 1.0000 | Freq: Once | ORAL | 0 refills | Status: AC

## 2019-10-27 NOTE — Progress Notes (Signed)
Gastroenterology Pre-Procedure Review  Request Date: Tuesday 11/04/19 Requesting Physician: Dr. Vicente Males  PATIENT REVIEW QUESTIONS: The patient responded to the following health history questions as indicated:    1. Are you having any GI issues? no 2. Do you have a personal history of Polyps? yes 3. Do you have a family history of Colon Cancer or Polyps?no 4. Diabetes Mellitus? yes (type 2) 5. Joint replacements in the past 12 months?no 6. Major health problems in the past 3 months?no 7. 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 yes (Coumadin and Warfarin blood thinner request sent to Dr. Crissie Sickles) Aspirin? no  Patient confirms/reports the following medications:  Current Outpatient Medications  Medication Sig Dispense Refill   citalopram (CELEXA) 20 MG tablet Take 1 tablet (20 mg total) by mouth daily. For depression. 30 tablet 1   clopidogrel (PLAVIX) 75 MG tablet TAKE 1 TABLET BY MOUTH EVERY DAY 90 tablet 3   diclofenac Sodium (VOLTAREN) 1 % GEL Apply 2 g topically 4 (four) times daily. 50 g 0   escitalopram (LEXAPRO) 20 MG tablet TAKE 1 TABLET (20 MG TOTAL) BY MOUTH DAILY. FOR DEPRESSION. 90 tablet 1   fish oil-omega-3 fatty acids 1000 MG capsule Take 1 g by mouth daily.       fluticasone (FLONASE) 50 MCG/ACT nasal spray SMARTSIG:1 Spray(s) Both Nares Daily PRN     furosemide (LASIX) 20 MG tablet Take 1 tablet (20 mg total) by mouth daily. 90 tablet 3   gabapentin (NEURONTIN) 300 MG capsule Take 1 capsule (300 mg total) by mouth 3 (three) times daily. 270 capsule 0   glucose blood (ONE TOUCH ULTRA TEST) test strip USE TO TEST UP TO 4 TIMES DAILY 100 each 2   isosorbide mononitrate (IMDUR) 60 MG 24 hr tablet Take 1 tablet (60 mg total) by mouth daily. 90  tablet 1   JARDIANCE 10 MG TABS tablet TAKE 1 TABLET BY MOUTH EVERY MORNING FOR DIABETES. 90 tablet 1   lisinopril (ZESTRIL) 5 MG tablet Take 1 tablet (5 mg total) by mouth daily. 90 tablet 0   metFORMIN (GLUCOPHAGE) 500 MG tablet TAKE 1 TABLET BY MOUTH TWICE DAILY WITH A MEAL 180 tablet 1   metoprolol succinate (TOPROL-XL) 25 MG 24 hr tablet TAKE 1 TABLET BY MOUTH EVERY DAY 90 tablet 3   nitroGLYCERIN (NITROSTAT) 0.4 MG SL tablet Place 1 tablet (0.4 mg total) under the tongue every 5 (five) minutes as needed for chest pain. 25 tablet 0   pantoprazole (PROTONIX) 40 MG tablet TAKE 1 TABLET (40 MG TOTAL) BY MOUTH DAILY FOR HEARTBURN. 90 tablet 1   rosuvastatin (CRESTOR) 20 MG tablet TAKE 1 TABLET BY MOUTH EVERY DAY IN THE EVENING FOR CHOLESTEROL 90 tablet 1   sitaGLIPtin (JANUVIA) 100 MG tablet TAKE 1 TABLET BY MOUTH ONCE DAILY FOR DIABETES. 90 tablet 1   tamsulosin (FLOMAX) 0.4 MG CAPS capsule TAKE 1 CAPSULE (0.4 MG TOTAL) BY MOUTH 2 (TWO) TIMES DAILY. 180 capsule 3   warfarin (COUMADIN) 5 MG tablet TAKE 1 TABLET ON MONDAY, TAKE HALF TAB EVERY DAY EXCEPT FOR MONDAY. 90 tablet 0   No current facility-administered medications for this visit.    Patient confirms/reports the following allergies:  No Known Allergies  No orders of the defined types were placed in this encounter.   AUTHORIZATION INFORMATION  Primary Insurance: 1D#: Group #:  Secondary Insurance: 1D#: Group #:  SCHEDULE INFORMATION: Date: Tuesday 11/04/19 Time: Location:[facility]

## 2019-10-27 NOTE — Telephone Encounter (Signed)
   [facility] Pre-operative Risk Assessment    HEARTCARE STAFF: - Please ensure there is not already an duplicate clearance open for this procedure. - Under Visit Info/Reason for Call, type in Other and utilize the format Clearance MM/DD/YY or Clearance TBD. Do not use dashes or single digits. - If request is for dental extraction, please clarify the # of teeth to be extracted.  Request for surgical clearance:  1. What type of surgery is being performed? COLONOSCOPY   2. When is this surgery scheduled? 11/04/19   3. What type of clearance is required (medical clearance vs. Pharmacy clearance to hold med vs. Both)? BOTH  4. Are there any medications that need to be held prior to surgery and how long? PLAVIX AND COUMADIN   5. Practice name and name of physician performing surgery? [facility]; DR. Bailey Mech ANNA   6. What is the office phone number? [phone]   7.   What is the office fax number? [place]  8.   Anesthesia type (None, local, MAC, general) ? NOT LISTED    Julaine Hua 10/27/2019, 2:27 PM  _________________________________________________________________   (provider comments below)

## 2019-10-27 NOTE — Telephone Encounter (Signed)
   Primary Cardiologist: [name], MD  Chart reviewed as part of pre-operative protocol coverage. Patient was contacted 10/27/2019 in reference to pre-operative risk assessment for pending surgery as outlined below.  [name] Cook was last seen on 07/24/19 by Tommye Standard, PA-C.  Since that day, [name] Cook has done fine from a cardiac standpoint. He has no complaints of chest pain, SOB, or palpitations. He can complete 4 METs without anginal complaints.  Therefore, based on ACC/AHA guidelines, the patient would be at acceptable risk for the planned procedure without further cardiovascular testing.   Per previous recommendations, and given lack of interval change in cardiac history, patient can hold plavix 5 days prior to his upcoming colonoscopy with plans to restart when cleared to do so by his gastroenterologist.  His coumadin is managed by his PCP, therefore will defer recommendations for holding coumadin to Alma Friendly, NP. Please reach out to her office for input.   I will route this recommendation to the requesting party via Epic fax function and remove from pre-op pool. Please call with questions.  Abigail Butts, PA-C 10/27/2019, 4:20 PM

## 2019-10-28 ENCOUNTER — Other Ambulatory Visit: Payer: Self-pay | Admitting: Primary Care

## 2019-10-28 ENCOUNTER — Telehealth: Payer: Self-pay | Admitting: Family Medicine

## 2019-10-28 DIAGNOSIS — E119 Type 2 diabetes mellitus without complications: Secondary | ICD-10-CM

## 2019-10-28 NOTE — Telephone Encounter (Signed)
[facility] called [place] in error. In order to fill the [place]ane they need a copy of the RX with an original MD signature faxed to them at [phone]. [facility] phone # [phone]

## 2019-10-29 ENCOUNTER — Other Ambulatory Visit: Payer: Self-pay

## 2019-10-29 NOTE — Telephone Encounter (Signed)
RX written and fax form from [facility] signed, both faxed back to numbers given for Durolane approval.  RX for Durolane 60mg /44ml bilateral knee injections, #2, 0 refills faxed to [place].  Patient contacted and made aware to expect a call from [facility] to Review benefits and coverage.  If affordable, patient aware that [facility] will call me back to coordinate delivery and then we will call patient to schedule injections.

## 2019-10-30 ENCOUNTER — Ambulatory Visit (INDEPENDENT_AMBULATORY_CARE_PROVIDER_SITE_OTHER): Payer: Medicare Other

## 2019-10-30 ENCOUNTER — Telehealth: Payer: Self-pay | Admitting: Primary Care

## 2019-10-30 ENCOUNTER — Other Ambulatory Visit: Payer: Self-pay

## 2019-10-30 DIAGNOSIS — Z7901 Long term (current) use of anticoagulants: Secondary | ICD-10-CM

## 2019-10-30 LAB — POCT INR: INR: 2.1 (ref 2.0–3.0)

## 2019-10-30 NOTE — Telephone Encounter (Signed)
Pt as in today for coumadin management. Pt said he was called by a nurse named Cindy yesterday and told to hold his coumadin for 3 days. This was not the coumadin nurse Jenny Reichmann because this nurse contacted her and she did not contact the pt.  Advised pt the following  Take last dose of coumadin on 10/31/19. The day after the procedure, on 11/05/19, start coumadin and taek 5mg  on 7/21, 7/22 and 7/23 and then return to normal dosing. Recheck on 11/11/19.

## 2019-10-30 NOTE — Patient Instructions (Addendum)
Pre visit review using our clinic review tool, if applicable. No additional management support is needed unless otherwise documented below in the visit note.  Continue to take 1/2  tablet (2.5 mg) daily, except take 1 tablet (5mg ) on Monday. Take last dose of coumadin on 10/31/19.  The day after the procedure, on 11/05/19, start coumadin and taek 5mg  on 7/21, 7/22 and 7/23 and then return to normal dosing. Recheck on 11/11/19.

## 2019-10-30 NOTE — Telephone Encounter (Signed)
Mr. Pettijohn is scheduled for a colonoscopy on 11/04/19 and GI is requesting permission to hold warfarin for procedure.  What is our protocol for this? I have a form from GI in my office.

## 2019-10-31 ENCOUNTER — Other Ambulatory Visit: Payer: Self-pay | Admitting: Primary Care

## 2019-10-31 ENCOUNTER — Other Ambulatory Visit
Admission: RE | Admit: 2019-10-31 | Discharge: 2019-10-31 | Disposition: A | Discharge status: Home / Self Care | Payer: Medicare Other | Source: Ambulatory Visit | Attending: Gastroenterology | Admitting: Gastroenterology

## 2019-10-31 DIAGNOSIS — F339 Major depressive disorder, recurrent, unspecified: Secondary | ICD-10-CM

## 2019-10-31 DIAGNOSIS — Z20822 Contact with and (suspected) exposure to covid-19: Secondary | ICD-10-CM | POA: Insufficient documentation

## 2019-10-31 DIAGNOSIS — Z01812 Encounter for preprocedural laboratory examination: Secondary | ICD-10-CM | POA: Insufficient documentation

## 2019-10-31 LAB — SARS CORONAVIRUS 2 (TAT 6-24 HRS): SARS Coronavirus 2: NEGATIVE

## 2019-10-31 NOTE — Telephone Encounter (Signed)
Noted  

## 2019-10-31 NOTE — Telephone Encounter (Signed)
Ok to change to 90 days supply 

## 2019-11-02 NOTE — Telephone Encounter (Signed)
Changed to 90 day supply

## 2019-11-03 ENCOUNTER — Other Ambulatory Visit: Payer: Self-pay

## 2019-11-03 ENCOUNTER — Telehealth: Payer: Self-pay

## 2019-11-03 ENCOUNTER — Other Ambulatory Visit: Payer: Self-pay | Admitting: Primary Care

## 2019-11-03 DIAGNOSIS — E119 Type 2 diabetes mellitus without complications: Secondary | ICD-10-CM

## 2019-11-03 DIAGNOSIS — K635 Polyp of colon: Secondary | ICD-10-CM

## 2019-11-03 NOTE — Telephone Encounter (Signed)
Returned patients call to reschedule his tomorrows colonoscopy.  LVM for him to call me back with a date that he would like to reschedule to.  Thanks,  Sharyn Lull

## 2019-11-04 ENCOUNTER — Encounter: Admission: RE | Payer: Self-pay | Source: Home / Self Care

## 2019-11-04 ENCOUNTER — Ambulatory Visit: Admission: RE | Admit: 2019-11-04 | Payer: Medicare Other | Source: Home / Self Care | Admitting: Gastroenterology

## 2019-11-04 SURGERY — COLONOSCOPY WITH PROPOFOL
Anesthesia: General

## 2019-11-06 NOTE — Telephone Encounter (Signed)
Pt called back checking on injections autho.  He stated he received approval from main company and they were to send you information.  He is checking to make sure you have received this  He wanted to talk to you  Best number [phone]

## 2019-11-07 NOTE — Telephone Encounter (Signed)
Patient called back to speak to [facility].  Patient can be reached at [phone].

## 2019-11-07 NOTE — Telephone Encounter (Signed)
Left message returning patients call, will try again on Monday.

## 2019-11-11 ENCOUNTER — Telehealth: Payer: Self-pay

## 2019-11-11 ENCOUNTER — Ambulatory Visit: Payer: Medicare Other

## 2019-11-11 NOTE — Telephone Encounter (Signed)
Pt cancelled coumadin apt for today due to him postponing his colonoscopy. LVM to RS his coumadin apt. He has an OV on 8/5 that he could also have a coumadin apt if he would like.

## 2019-11-12 NOTE — Telephone Encounter (Signed)
Patient called in returning call for [facility]. Transferred called to shannon's voicemail.

## 2019-11-12 NOTE — Telephone Encounter (Signed)
[place] [facility] - Client Nonclinical Telephone Record AccessNurse Client [place] [facility] - Client Client Site [place] - Night Physician [facility] - PHYSICIAN, Verita Schneiders- MD Contact Type Call Who Is Calling Patient / Member / Family / Caregiver Caller Name [name] Cook Caller Phone Number [phone] Call Type Message Only Information Provided Reason for Call Returning a Call from the Office Initial [place] states he needs to speak with [facility] in the office. Additional Comment Office hours provided. Disp. Time Disposition Final User 11/11/2019 5:24:49 PM General Information Provided Yes [name] Call Closed By: [name] Transaction Date/Time: 11/11/2019 5:22:37 PM (ET)

## 2019-11-12 NOTE — Telephone Encounter (Signed)
LVM

## 2019-11-13 NOTE — Telephone Encounter (Signed)
LVM

## 2019-11-13 NOTE — Telephone Encounter (Signed)
Pt RS for 8/5 when he also has apt with PCP.

## 2019-11-13 NOTE — Telephone Encounter (Signed)
Advised pt we are sending request through his pharmacy benefits. Advised if he hears anything from them to contact the office and ask for this nurse or Mandy. Advised this office would f/u as soon as anything was determined. Pt verbalized understanding.

## 2019-11-16 ENCOUNTER — Other Ambulatory Visit: Payer: Self-pay | Admitting: Family Medicine

## 2019-11-18 ENCOUNTER — Other Ambulatory Visit: Payer: Self-pay | Admitting: Physician Assistant

## 2019-11-18 DIAGNOSIS — I10 Essential (primary) hypertension: Secondary | ICD-10-CM

## 2019-11-20 ENCOUNTER — Ambulatory Visit (INDEPENDENT_AMBULATORY_CARE_PROVIDER_SITE_OTHER): Payer: Medicare Other | Admitting: Primary Care

## 2019-11-20 ENCOUNTER — Ambulatory Visit (INDEPENDENT_AMBULATORY_CARE_PROVIDER_SITE_OTHER): Payer: Medicare Other

## 2019-11-20 ENCOUNTER — Other Ambulatory Visit: Payer: Self-pay

## 2019-11-20 ENCOUNTER — Encounter: Payer: Self-pay | Admitting: Primary Care

## 2019-11-20 VITALS — BP 116/74 | HR 82 | Temp 96.4°F | Ht 71.0 in | Wt 231.2 lb

## 2019-11-20 DIAGNOSIS — J439 Emphysema, unspecified: Secondary | ICD-10-CM

## 2019-11-20 DIAGNOSIS — Z7901 Long term (current) use of anticoagulants: Secondary | ICD-10-CM | POA: Diagnosis not present

## 2019-11-20 DIAGNOSIS — F339 Major depressive disorder, recurrent, unspecified: Secondary | ICD-10-CM

## 2019-11-20 LAB — POCT INR: INR: 2.5 (ref 2.0–3.0)

## 2019-11-20 MED ORDER — CITALOPRAM HYDROBROMIDE 40 MG PO TABS: 40.0000 mg | ORAL_TABLET | Freq: Every day | ORAL | 0 refills | Status: DC

## 2019-11-20 NOTE — Patient Instructions (Addendum)
We've increased the dose of your citalopram to 40 mg. Do not take both 20 and 40 mg tablets. Please update me in one month.  Please verify at home if you are taking bupropion (Wellbutrin). I do not have it on your current medication list. You do not need to take it.  Schedule a lab appointment for diabetes check for after September 22nd.  Schedule a follow up with me for early January 2022.  It was a pleasure to see you today!

## 2019-11-20 NOTE — Patient Instructions (Addendum)
Pre visit review using our clinic review tool, if applicable. No additional management support is needed unless otherwise documented below in the visit note.  Continue 2.5mg  daily except take 5 mg on Monday. Recheck in 4 wks.

## 2019-11-20 NOTE — Assessment & Plan Note (Signed)
Improved with citalopram, may benefit from dose increase. Will increase dose to 40 mg. He will update in one month.   For some reason bupropion is no longer on his medication list, will have him verify if he is taking this when he gets home.   Okay to remain off if not taking.

## 2019-11-20 NOTE — Progress Notes (Signed)
Subjective:    Patient ID: [name] Cook, male    DOB: 1951-04-16, 69 y.o.   MRN: [id number]  HPI  This visit occurred during the SARS-CoV-2 public health emergency.  Safety protocols were in place, including screening questions prior to the visit, additional usage of staff PPE, and extensive cleaning of exam room while observing appropriate contact time as indicated for disinfecting solutions.   [name] Cook is a 69 year old male who presents today for follow up of depression.  He was last evaluated on 10/09/19 for CPE. During his CPE he endorsed feeling that depression was not well managed on Lexapro and Wellbutrin. Symptoms included feeling down, not feeling happy, feeling tired and anxious. Given symptoms we switched him from Lexapro to Celexa.   Since his last visit he's compliant to his citalopram 20 mg and is feeling somewhat better. He is no longer taking Wellbutrin and hasn't noticed any problems with stopping. Positive effects from Citalopram include feeling less down and anxious, but continues to notice irritability. He is interested in a dose increase of citalopram to 40 mg. He denies nausea, GI upset, SI/HI.   He also brings with him a handicap placard renewal form. His current placard expired as of June 2021. He has a placard due to exertional shortness of breath from COPD, cannot walk long distances.   Review of Systems  Respiratory:       Chronic exertional shortness of breath   Cardiovascular: Negative for chest pain.  Gastrointestinal: Negative for nausea.  Neurological: Negative for dizziness.  Psychiatric/Behavioral: Negative for suicidal ideas.       See HPI       Past Medical History:  Diagnosis Date  . Arthritis    "knees and lower back" (03/14/2013)  . Atrial flutter ([place])    radiofrequency ablation in 2001  . CAD (coronary artery disease)    a. Nonobstructive. Cardiac cath in 2001-50% mid RI, normal LM, LAD, RCA b. cath 10/16/2014 95% mid RCA treated with DES,  99% ost D1 medical management due to small aneurysmal segment  . Chronic anticoagulation    chronic Coumadin anticoagulation  . Chronic obstructive pulmonary disease ([place]) 04/20/2011  . Diabetes mellitus, type 2 ([place])   . GERD (gastroesophageal reflux disease)   . Hyperlipidemia   . Hypertension    with hypertensive heart disease  . Left knee pain 10/25/2017   medial  . Obesity   . Persistent atrial fibrillation (HCC)    recurrent atrial flutter since 2001 s/p DCCVs, multiple failed AADs, h/o tachy-mediated cardiomyopathy  . Shortness of breath    "can come on at any time" (03/14/2013)  . Sleep apnea    "dx'd; couldn't wear the mask" (03/14/2013)  . Tobacco abuse      Social History   Socioeconomic History  . Marital status: Widowed    Spouse name: Not on file  . Number of children: 1  . Years of education: Not on file  . Highest education level: Not on file  Occupational History  . Occupation: Merchandiser, retail: UNEMPLOYED  Tobacco Use  . Smoking status: Former Smoker    Packs/day: 1.00    Years: 42.00    Pack years: 42.00    Types: Cigarettes    Quit date: 12/31/2011    Years since quitting: 7.8  . Smokeless tobacco: Never Used  Vaping Use  . Vaping Use: Never used  Substance and Sexual Activity  . Alcohol use: Not Currently  Comment: 03/14/2013 "stopped drinking back in 2002; never had problem w/it"  . Drug use: No  . Sexual activity: Not Currently  Other Topics Concern  . Not on file  Social History Narrative   Single.   Retired.    1 son, deceased.    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   Enjoys playing on the computer.       Social Determinants of Health   Financial Resource Strain: Low Risk   . Difficulty of Paying Living Expenses: Not hard at all  Food Insecurity: No Food Insecurity  . Worried About Charity fundraiser in the Last Year: Never true  . Ran Out of Food in the Last Year: Never true    Transportation Needs: No Transportation Needs  . Lack of Transportation (Medical): No  . Lack of Transportation (Non-Medical): No  Physical Activity: Inactive  . Days of Exercise per Week: 0 days  . Minutes of Exercise per Session: 0 min  Stress: No Stress Concern Present  . Feeling of Stress : Not at all  Social Connections:   . Frequency of Communication with Friends and Family:   . Frequency of Social Gatherings with Friends and Family:   . Attends Religious Services:   . Active Member of Clubs or Organizations:   . Attends Archivist Meetings:   Marland Kitchen Marital Status:   Intimate Partner Violence: Not At Risk  . Fear of Current or Ex-Partner: No  . Emotionally Abused: No  . Physically Abused: No  . Sexually Abused: No    Past Surgical History:  Procedure Laterality Date  . ATRIAL FLUTTER ABLATION  2002   atrial flutter; subsequently developed atrial fibrillation  . AV NODE ABLATION  01/24/2013  . CARDIAC CATHETERIZATION  2002  . CARDIAC CATHETERIZATION N/A 10/16/2014   Procedure: Left Heart Cath and Coronary Angiography;  Surgeon: [name], MD;  Location: [facility];  Service: Cardiovascular;  Laterality: N/A;  . CARDIOVERSION  05/31/2011   Procedure: CARDIOVERSION;  Surgeon: Cristopher Estimable. Lattie Haw, MD;  Location: [facility];  Service: Cardiovascular;  Laterality: N/A;  . CARPAL TUNNEL RELEASE Left 1980's  . COLONOSCOPY WITH PROPOFOL N/A 12/30/2018   Procedure: COLONOSCOPY WITH PROPOFOL;  Surgeon: Jonathon Bellows, MD;  Location: [facility] ENDOSCOPY;  Service: Gastroenterology;  Laterality: N/A;  . INSERT / REPLACE / REMOVE PACEMAKER  01/24/2013    Medtronic Adapta L dual-chamber pacemaker, serial number NWE [id number]   . LEFT HEART CATH AND CORONARY ANGIOGRAPHY N/A 06/07/2018   Procedure: LEFT HEART CATH AND CORONARY ANGIOGRAPHY;  Surgeon: [name], MD;  Location: [place] [facility];  Service: Cardiovascular;  Laterality: N/A;  . LEFT HEART CATHETERIZATION WITH CORONARY  ANGIOGRAM N/A 03/17/2013   Procedure: LEFT HEART CATHETERIZATION WITH CORONARY ANGIOGRAM;  Surgeon: Burnell Blanks, MD; LAD mild dz, D1 branch 100%, inferior branch 99%, CFX OK, RCA 50%, EF 65%    . LOOP RECORDER IMPLANT  2002  . PERMANENT PACEMAKER INSERTION N/A 01/24/2013   Procedure: PERMANENT PACEMAKER INSERTION;  Surgeon: [name], MD;  Location: [facility] CATH LAB;  Service: Cardiovascular;  Laterality: N/A;  . TIBIAL TUBERCLERPLASTY  ~ 2003    Family History  Problem Relation Age of Onset  . Alzheimer's disease Mother   . Osteoporosis Mother     No Known Allergies  Current Outpatient Medications on File Prior to Visit  Medication Sig Dispense Refill  . clopidogrel (PLAVIX) 75 MG tablet TAKE 1 TABLET BY  MOUTH EVERY DAY 90 tablet 3  . diclofenac Sodium (VOLTAREN) 1 % GEL Apply 2 g topically 4 (four) times daily. 50 g 0  . fish oil-omega-3 fatty acids 1000 MG capsule Take 1 g by mouth daily.      . fluticasone (FLONASE) 50 MCG/ACT nasal spray SMARTSIG:1 Spray(s) Both Nares Daily PRN    . furosemide (LASIX) 20 MG tablet Take 1 tablet (20 mg total) by mouth daily. 90 tablet 3  . gabapentin (NEURONTIN) 300 MG capsule Take 1 capsule (300 mg total) by mouth 3 (three) times daily. 270 capsule 0  . glucose blood (ONE TOUCH ULTRA TEST) test strip USE TO TEST UP TO 4 TIMES DAILY 100 each 2  . isosorbide mononitrate (IMDUR) 60 MG 24 hr tablet TAKE 1 TABLET BY MOUTH EVERY DAY 90 tablet 2  . JARDIANCE 10 MG TABS tablet TAKE 1 TABLET BY MOUTH EVERY MORNING FOR DIABETES 90 tablet 1  . lisinopril (ZESTRIL) 5 MG tablet TAKE 1 TABLET BY MOUTH EVERY DAY 90 tablet 0  . metFORMIN (GLUCOPHAGE) 500 MG tablet TAKE 1 TABLET BY MOUTH TWICE DAILY WITH A MEAL 180 tablet 1  . metoprolol succinate (TOPROL-XL) 25 MG 24 hr tablet TAKE 1 TABLET BY MOUTH EVERY DAY 90 tablet 3  . nitroGLYCERIN (NITROSTAT) 0.4 MG SL tablet Place 1 tablet (0.4 mg total) under the tongue every 5 (five) minutes as needed for chest  pain. 25 tablet 0  . pantoprazole (PROTONIX) 40 MG tablet TAKE 1 TABLET (40 MG TOTAL) BY MOUTH DAILY FOR HEARTBURN. 90 tablet 1  . rosuvastatin (CRESTOR) 20 MG tablet TAKE 1 TABLET BY MOUTH EVERY DAY IN THE EVENING FOR CHOLESTEROL 90 tablet 1  . sitaGLIPtin (JANUVIA) 100 MG tablet TAKE 1 TABLET BY MOUTH ONCE DAILY FOR DIABETES. 90 tablet 1  . tamsulosin (FLOMAX) 0.4 MG CAPS capsule TAKE 1 CAPSULE (0.4 MG TOTAL) BY MOUTH 2 (TWO) TIMES DAILY. 180 capsule 3  . warfarin (COUMADIN) 5 MG tablet TAKE 1 TABLET ON MONDAY, TAKE HALF TAB EVERY DAY EXCEPT FOR MONDAY. 90 tablet 0   No current facility-administered medications on file prior to visit.    BP 116/74   Pulse 82   Temp (!) 96.4 F (35.8 C) (Temporal)   Ht 5\' 11"  (1.803 m)   Wt 231 lb 4 oz (104.9 kg)   SpO2 97%   BMI 32.25 kg/m    Objective:   Physical Exam Cardiovascular:     Rate and Rhythm: Normal rate and regular rhythm.  Pulmonary:     Effort: Pulmonary effort is normal.     Breath sounds: Normal breath sounds.  Musculoskeletal:     Cervical back: Neck supple.  Skin:    General: Skin is warm and dry.            Assessment & Plan:

## 2019-11-20 NOTE — Assessment & Plan Note (Signed)
Agree to renew handicap placard due to exertional shortness of breath.

## 2019-11-25 ENCOUNTER — Telehealth: Payer: Self-pay

## 2019-11-25 NOTE — Telephone Encounter (Signed)
Pt reports his colonoscopy is rescheduled for 8/19 and his coumadin will need to be held.   Pt reported last time the nurse, Cindy(not sure who this nurse is), told him to hold his coumadin for 3 days.  Per Jenny Reichmann, the coumadin clinic nurse from [facility], reports the hold time is usually 5 days.   LVM

## 2019-11-26 NOTE — Telephone Encounter (Signed)
Pt returned call. He cannot remember what nurse told him to hold his coumadin for 3 days. He reports he thinks they always told him to hold if for 3 days. He said he never used a lovenox bridge. He was told by GI to hold his plavix for 5 days.  Advised pt to come to office on 8/13 for INR check and this nurse would have hold info and boost dose information for him at that time. Pt verbalized understanding.   Pt's last colonoscopy was 12/30/18 and it does not appear that he told anyone he was even having a colonoscopy because there is no record of him holding his coumadin.   Per Villa Herb, coumadin clinic nurse for [facility], coumadin is usually held for 5 days for colonoscopy.   Procedure on 8/19 Last dose of coumadin on 11/29/19 8/15 Hold coumadin 8/16 Hold coumadin 8/17 Hold coumadin 8/18 Hold coumadin 8/19 Hold coumadin 8/20 Take 5mg  coumadin 8/21 Take 5mg  coumadin 8/22 take 5 mg coumadin  Recheck on Mon 8/23

## 2019-11-26 NOTE — Telephone Encounter (Signed)
LVM  Need to know what nurse he talked to that told him to hold his coumadin for 3 days.

## 2019-11-27 NOTE — Telephone Encounter (Signed)
noted 

## 2019-11-28 ENCOUNTER — Telehealth: Payer: Self-pay

## 2019-11-28 ENCOUNTER — Ambulatory Visit (INDEPENDENT_AMBULATORY_CARE_PROVIDER_SITE_OTHER): Payer: Medicare Other

## 2019-11-28 ENCOUNTER — Other Ambulatory Visit: Payer: Self-pay

## 2019-11-28 DIAGNOSIS — Z7901 Long term (current) use of anticoagulants: Secondary | ICD-10-CM

## 2019-11-28 LAB — POCT INR: INR: 2.1 (ref 2.0–3.0)

## 2019-11-28 NOTE — Patient Instructions (Addendum)
Pre visit review using our clinic review tool, if applicable. No additional management support is needed unless otherwise documented below in the visit note.  Pt will have colonoscopy on 8/19 and will hold coumadin for 5 days. No lovenox bridge..  Last dose of coumadin. 11/29/19 Last dose of coumadin 11/30/19  No coumadin 12/01/19  No coumadin 12/02/19  No coumadin 12/03/19  No coumadin 12/04/19  No coumadin 12/05/19  Restart coumadin, take 5mg  coumadin 12/06/19   Take 5 mg coumadin 12/07/19    Take 5 mg coumadin 12/08/19    Take 2.5mg  coumadin 12/09/19    Recheck INR at 9:00 am

## 2019-11-28 NOTE — Telephone Encounter (Signed)
Faxed pt assistance paperwork with financial information pt brought to clinic to [id number] to [phone] with script and completed form.

## 2019-12-01 NOTE — Progress Notes (Signed)
I have reviewed this visit and I agree on the patient's plan of dosage and recommendations. Naydeen Speirs B Yerachmiel Spinney, FNP   

## 2019-12-02 ENCOUNTER — Other Ambulatory Visit: Payer: Self-pay

## 2019-12-02 ENCOUNTER — Other Ambulatory Visit
Admission: RE | Admit: 2019-12-02 | Discharge: 2019-12-02 | Disposition: A | Discharge status: Home / Self Care | Payer: Medicare Other | Source: Ambulatory Visit | Attending: Gastroenterology | Admitting: Gastroenterology

## 2019-12-02 DIAGNOSIS — Z20822 Contact with and (suspected) exposure to covid-19: Secondary | ICD-10-CM | POA: Diagnosis not present

## 2019-12-02 DIAGNOSIS — Z01812 Encounter for preprocedural laboratory examination: Secondary | ICD-10-CM | POA: Insufficient documentation

## 2019-12-02 LAB — SARS CORONAVIRUS 2 (TAT 6-24 HRS): SARS Coronavirus 2: NEGATIVE

## 2019-12-02 NOTE — Telephone Encounter (Signed)
Pt assistance paperwork for the injections has been filled out. Pt dropped off financial info and all has been faxed to [facility] on 11/28/19

## 2019-12-04 ENCOUNTER — Encounter
Admission: RE | Disposition: A | Discharge status: Home / Self Care | Payer: Self-pay | Source: Home / Self Care | Attending: Gastroenterology

## 2019-12-04 ENCOUNTER — Other Ambulatory Visit: Payer: Self-pay

## 2019-12-04 ENCOUNTER — Encounter: Payer: Self-pay | Admitting: Gastroenterology

## 2019-12-04 ENCOUNTER — Ambulatory Visit (INDEPENDENT_AMBULATORY_CARE_PROVIDER_SITE_OTHER): Payer: Medicare Other | Admitting: *Deleted

## 2019-12-04 ENCOUNTER — Ambulatory Visit: Payer: Medicare Other | Admitting: Certified Registered"

## 2019-12-04 ENCOUNTER — Ambulatory Visit
Admission: RE | Admit: 2019-12-04 | Discharge: 2019-12-04 | Disposition: A | Discharge status: Home / Self Care | Payer: Medicare Other | Attending: Gastroenterology | Admitting: Gastroenterology

## 2019-12-04 DIAGNOSIS — Z6832 Body mass index (BMI) 32.0-32.9, adult: Secondary | ICD-10-CM | POA: Diagnosis not present

## 2019-12-04 DIAGNOSIS — Z955 Presence of coronary angioplasty implant and graft: Secondary | ICD-10-CM | POA: Insufficient documentation

## 2019-12-04 DIAGNOSIS — J449 Chronic obstructive pulmonary disease, unspecified: Secondary | ICD-10-CM | POA: Diagnosis not present

## 2019-12-04 DIAGNOSIS — M1990 Unspecified osteoarthritis, unspecified site: Secondary | ICD-10-CM | POA: Diagnosis not present

## 2019-12-04 DIAGNOSIS — K219 Gastro-esophageal reflux disease without esophagitis: Secondary | ICD-10-CM | POA: Diagnosis not present

## 2019-12-04 DIAGNOSIS — F329 Major depressive disorder, single episode, unspecified: Secondary | ICD-10-CM | POA: Insufficient documentation

## 2019-12-04 DIAGNOSIS — I4891 Unspecified atrial fibrillation: Secondary | ICD-10-CM | POA: Insufficient documentation

## 2019-12-04 DIAGNOSIS — Z1211 Encounter for screening for malignant neoplasm of colon: Secondary | ICD-10-CM | POA: Insufficient documentation

## 2019-12-04 DIAGNOSIS — Z7901 Long term (current) use of anticoagulants: Secondary | ICD-10-CM | POA: Diagnosis not present

## 2019-12-04 DIAGNOSIS — Z86010 Personal history of colonic polyps: Secondary | ICD-10-CM | POA: Insufficient documentation

## 2019-12-04 DIAGNOSIS — I129 Hypertensive chronic kidney disease with stage 1 through stage 4 chronic kidney disease, or unspecified chronic kidney disease: Secondary | ICD-10-CM | POA: Diagnosis not present

## 2019-12-04 DIAGNOSIS — I429 Cardiomyopathy, unspecified: Secondary | ICD-10-CM | POA: Diagnosis not present

## 2019-12-04 DIAGNOSIS — E785 Hyperlipidemia, unspecified: Secondary | ICD-10-CM | POA: Insufficient documentation

## 2019-12-04 DIAGNOSIS — N182 Chronic kidney disease, stage 2 (mild): Secondary | ICD-10-CM | POA: Diagnosis not present

## 2019-12-04 DIAGNOSIS — E669 Obesity, unspecified: Secondary | ICD-10-CM | POA: Insufficient documentation

## 2019-12-04 DIAGNOSIS — Z538 Procedure and treatment not carried out for other reasons: Secondary | ICD-10-CM | POA: Diagnosis not present

## 2019-12-04 DIAGNOSIS — Z820 Family history of epilepsy and other diseases of the nervous system: Secondary | ICD-10-CM | POA: Insufficient documentation

## 2019-12-04 DIAGNOSIS — Z950 Presence of cardiac pacemaker: Secondary | ICD-10-CM | POA: Insufficient documentation

## 2019-12-04 DIAGNOSIS — I119 Hypertensive heart disease without heart failure: Secondary | ICD-10-CM | POA: Diagnosis not present

## 2019-12-04 DIAGNOSIS — E118 Type 2 diabetes mellitus with unspecified complications: Secondary | ICD-10-CM | POA: Insufficient documentation

## 2019-12-04 DIAGNOSIS — E119 Type 2 diabetes mellitus without complications: Secondary | ICD-10-CM | POA: Diagnosis not present

## 2019-12-04 DIAGNOSIS — Z5309 Procedure and treatment not carried out because of other contraindication: Secondary | ICD-10-CM | POA: Insufficient documentation

## 2019-12-04 DIAGNOSIS — G4730 Sleep apnea, unspecified: Secondary | ICD-10-CM | POA: Insufficient documentation

## 2019-12-04 DIAGNOSIS — Z8262 Family history of osteoporosis: Secondary | ICD-10-CM | POA: Insufficient documentation

## 2019-12-04 DIAGNOSIS — K635 Polyp of colon: Secondary | ICD-10-CM

## 2019-12-04 DIAGNOSIS — I2510 Atherosclerotic heart disease of native coronary artery without angina pectoris: Secondary | ICD-10-CM | POA: Insufficient documentation

## 2019-12-04 DIAGNOSIS — I442 Atrioventricular block, complete: Secondary | ICD-10-CM | POA: Diagnosis not present

## 2019-12-04 DIAGNOSIS — I252 Old myocardial infarction: Secondary | ICD-10-CM | POA: Insufficient documentation

## 2019-12-04 DIAGNOSIS — Z7984 Long term (current) use of oral hypoglycemic drugs: Secondary | ICD-10-CM | POA: Diagnosis not present

## 2019-12-04 DIAGNOSIS — Z791 Long term (current) use of non-steroidal anti-inflammatories (NSAID): Secondary | ICD-10-CM | POA: Insufficient documentation

## 2019-12-04 DIAGNOSIS — Z79899 Other long term (current) drug therapy: Secondary | ICD-10-CM | POA: Insufficient documentation

## 2019-12-04 DIAGNOSIS — E1122 Type 2 diabetes mellitus with diabetic chronic kidney disease: Secondary | ICD-10-CM | POA: Diagnosis not present

## 2019-12-04 DIAGNOSIS — Z87891 Personal history of nicotine dependence: Secondary | ICD-10-CM | POA: Insufficient documentation

## 2019-12-04 HISTORY — PX: COLONOSCOPY WITH PROPOFOL: SHX5780

## 2019-12-04 LAB — GLUCOSE, CAPILLARY: Glucose-Capillary: 118 mg/dL — ABNORMAL HIGH (ref 70–99)

## 2019-12-04 SURGERY — COLONOSCOPY WITH PROPOFOL
Anesthesia: General

## 2019-12-04 MED ORDER — PROPOFOL 10 MG/ML IV BOLUS
INTRAVENOUS | Status: DC | PRN
  Administered 2019-12-04: 50 mg via INTRAVENOUS

## 2019-12-04 MED ORDER — PHENYLEPHRINE HCL (PRESSORS) 10 MG/ML IV SOLN
INTRAVENOUS | Status: DC | PRN
  Administered 2019-12-04: 100 ug via INTRAVENOUS

## 2019-12-04 MED ORDER — LIDOCAINE HCL (CARDIAC) PF 100 MG/5ML IV SOSY
PREFILLED_SYRINGE | INTRAVENOUS | Status: DC | PRN
  Administered 2019-12-04: 100 mg via INTRAVENOUS

## 2019-12-04 MED ORDER — SODIUM CHLORIDE 0.9 % IV SOLN: INTRAVENOUS | Status: DC

## 2019-12-04 MED ORDER — PROPOFOL 500 MG/50ML IV EMUL
INTRAVENOUS | Status: DC | PRN
  Administered 2019-12-04: 155 ug/kg/min via INTRAVENOUS

## 2019-12-04 NOTE — H&P (Signed)
Jonathon Bellows, MD [address] [place], [place], [place], [address] [address], [place], [place], [place], [address] Phone: [phone]  Fax: [phone]  Primary Care Physician:  [name], NP   Pre-Procedure History & Physical: HPI:  [name] Cook is a 69 y.o. male is here for an colonoscopy.   Past Medical History:  Diagnosis Date  . Arthritis    "knees and lower back" (03/14/2013)  . Atrial flutter ([place])    radiofrequency ablation in 2001  . CAD (coronary artery disease)    a. Nonobstructive. Cardiac cath in 2001-50% mid RI, normal LM, LAD, RCA b. cath 10/16/2014 95% mid RCA treated with DES, 99% ost D1 medical management due to small aneurysmal segment  . Chronic anticoagulation    chronic Coumadin anticoagulation  . Chronic obstructive pulmonary disease ([place]) 04/20/2011  . Diabetes mellitus, type 2 ([place])   . GERD (gastroesophageal reflux disease)   . Hyperlipidemia   . Hypertension    with hypertensive heart disease  . Left knee pain 10/25/2017   medial  . Obesity   . Persistent atrial fibrillation (HCC)    recurrent atrial flutter since 2001 s/p DCCVs, multiple failed AADs, h/o tachy-mediated cardiomyopathy  . Shortness of breath    "can come on at any time" (03/14/2013)  . Sleep apnea    "dx'd; couldn't wear the mask" (03/14/2013)  . Tobacco abuse     Past Surgical History:  Procedure Laterality Date  . ATRIAL FLUTTER ABLATION  2002   atrial flutter; subsequently developed atrial fibrillation  . AV NODE ABLATION  01/24/2013  . CARDIAC CATHETERIZATION  2002  . CARDIAC CATHETERIZATION N/A 10/16/2014   Procedure: Left Heart Cath and Coronary Angiography;  Surgeon: [name], MD;  Location: [facility];  Service: Cardiovascular;  Laterality: N/A;  . CARDIOVERSION  05/31/2011   Procedure: CARDIOVERSION;  Surgeon: Cristopher Estimable. Lattie Haw, MD;  Location: [facility];  Service: Cardiovascular;  Laterality: N/A;  . CARPAL TUNNEL RELEASE Left 1980's  .  COLONOSCOPY WITH PROPOFOL N/A 12/30/2018   Procedure: COLONOSCOPY WITH PROPOFOL;  Surgeon: Jonathon Bellows, MD;  Location: [facility] ENDOSCOPY;  Service: Gastroenterology;  Laterality: N/A;  . INSERT / REPLACE / REMOVE PACEMAKER  01/24/2013    Medtronic Adapta L dual-chamber pacemaker, serial number NWE [id number]   . LEFT HEART CATH AND CORONARY ANGIOGRAPHY N/A 06/07/2018   Procedure: LEFT HEART CATH AND CORONARY ANGIOGRAPHY;  Surgeon: [name], MD;  Location: [place] [facility];  Service: Cardiovascular;  Laterality: N/A;  . LEFT HEART CATHETERIZATION WITH CORONARY ANGIOGRAM N/A 03/17/2013   Procedure: LEFT HEART CATHETERIZATION WITH CORONARY ANGIOGRAM;  Surgeon: Burnell Blanks, MD; LAD mild dz, D1 branch 100%, inferior branch 99%, CFX OK, RCA 50%, EF 65%    . LOOP RECORDER IMPLANT  2002  . PERMANENT PACEMAKER INSERTION N/A 01/24/2013   Procedure: PERMANENT PACEMAKER INSERTION;  Surgeon: [name], MD;  Location: [facility] CATH LAB;  Service: Cardiovascular;  Laterality: N/A;  . TIBIAL TUBERCLERPLASTY  ~ 2003    Prior to Admission medications   Medication Sig Start Date End Date Taking? Authorizing Provider  citalopram (CELEXA) 40 MG tablet Take 1 tablet (40 mg total) by mouth daily. For depression. 11/20/19  Yes [name], NP  gabapentin (NEURONTIN) 300 MG capsule Take 1 capsule (300 mg total) by mouth 3 (three) times daily. 08/28/19  Yes [name], NP  isosorbide mononitrate (IMDUR) 60 MG 24 hr tablet TAKE 1 TABLET BY MOUTH EVERY DAY  11/19/19  Yes [name], MD  JARDIANCE 10 MG TABS tablet TAKE 1 TABLET BY MOUTH EVERY MORNING FOR DIABETES 10/28/19  Yes [name], NP  lisinopril (ZESTRIL) 5 MG tablet TAKE 1 TABLET BY MOUTH EVERY DAY 11/18/19  Yes [name], NP  metFORMIN (GLUCOPHAGE) 500 MG tablet TAKE 1 TABLET BY MOUTH TWICE DAILY WITH A MEAL 07/24/19  Yes [name], NP  metoprolol succinate (TOPROL-XL) 25 MG 24 hr tablet TAKE 1 TABLET BY MOUTH EVERY  DAY 07/08/19  Yes [name], MD  nitroGLYCERIN (NITROSTAT) 0.4 MG SL tablet Place 1 tablet (0.4 mg total) under the tongue every 5 (five) minutes as needed for chest pain. 01/23/18  Yes [name], NP  pantoprazole (PROTONIX) 40 MG tablet TAKE 1 TABLET (40 MG TOTAL) BY MOUTH DAILY FOR HEARTBURN. 07/04/19  Yes [name], NP  rosuvastatin (CRESTOR) 20 MG tablet TAKE 1 TABLET BY MOUTH EVERY DAY IN THE EVENING FOR CHOLESTEROL 10/23/19  Yes [name], NP  tamsulosin (FLOMAX) 0.4 MG CAPS capsule TAKE 1 CAPSULE (0.4 MG TOTAL) BY MOUTH 2 (TWO) TIMES DAILY. 01/10/19  Yes Chrystal, Eulas Post, MD  clopidogrel (PLAVIX) 75 MG tablet TAKE 1 TABLET BY MOUTH EVERY DAY 09/08/19   [name], MD  diclofenac Sodium (VOLTAREN) 1 % GEL Apply 2 g topically 4 (four) times daily. 07/17/19   Khatri, Hina, PA-C  fish oil-omega-3 fatty acids 1000 MG capsule Take 1 g by mouth daily.      [provider]  fluticasone (FLONASE) 50 MCG/ACT nasal spray SMARTSIG:1 Spray(s) Both Nares Daily PRN 02/23/19   [provider]  furosemide (LASIX) 20 MG tablet Take 1 tablet (20 mg total) by mouth daily. 08/25/19   [name], MD  glucose blood (ONE TOUCH ULTRA TEST) test strip USE TO TEST UP TO 4 TIMES DAILY 10/23/17   [name], NP  sitaGLIPtin (JANUVIA) 100 MG tablet TAKE 1 TABLET BY MOUTH ONCE DAILY FOR DIABETES. 11/04/19   [name], NP  warfarin (COUMADIN) 5 MG tablet TAKE 1 TABLET ON MONDAY, TAKE HALF TAB EVERY DAY EXCEPT FOR MONDAY. 06/24/19   [name], NP    Allergies as of 11/03/2019  . (No Known Allergies)    Family History  Problem Relation Age of Onset  . Alzheimer's disease Mother   . Osteoporosis Mother     Social History   Socioeconomic History  . Marital status: Widowed    Spouse name: Not on file  . Number of children: 1  . Years of education: Not on file  . Highest education level: Not on file  Occupational History  . Occupation:  Merchandiser, retail: UNEMPLOYED  Tobacco Use  . Smoking status: Former Smoker    Packs/day: 1.00    Years: 42.00    Pack years: 42.00    Types: Cigarettes    Quit date: 12/31/2011    Years since quitting: 7.9  . Smokeless tobacco: Never Used  Vaping Use  . Vaping Use: Never used  Substance and Sexual Activity  . Alcohol use: Not Currently    Comment: 03/14/2013 "stopped drinking back in 2002; never had problem w/it"  . Drug use: No  . Sexual activity: Not Currently  Other Topics Concern  . Not on file  Social History Narrative   Single.   Retired.    1 son, deceased.    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   Enjoys playing on the computer.       Social Determinants of Health   Financial Resource Strain: Low Risk   . Difficulty of Paying Living Expenses: Not hard at all  Food Insecurity: No Food Insecurity  . Worried About Charity fundraiser in the Last Year: Never true  . Ran Out of Food in the Last Year: Never true  Transportation Needs: No Transportation Needs  . Lack of Transportation (Medical): No  . Lack of Transportation (Non-Medical): No  Physical Activity: Inactive  . Days of Exercise per Week: 0 days  . Minutes of Exercise per Session: 0 min  Stress: No Stress Concern Present  . Feeling of Stress : Not at all  Social Connections:   . Frequency of Communication with Friends and Family: Not on file  . Frequency of Social Gatherings with Friends and Family: Not on file  . Attends Religious Services: Not on file  . Active Member of Clubs or Organizations: Not on file  . Attends Archivist Meetings: Not on file  . Marital Status: Not on file  Intimate Partner Violence: Not At Risk  . Fear of Current or Ex-Partner: No  . Emotionally Abused: No  . Physically Abused: No  . Sexually Abused: No    Review of Systems: See HPI, otherwise negative ROS  Physical Exam: BP 103/67   Pulse 85   Temp 97.6 F  (36.4 C)   Resp 16   Ht 5\' 11"  (1.803 m)   Wt 104.3 kg   SpO2 96%   BMI 32.08 kg/m  General:   Alert,  pleasant and cooperative in NAD Head:  Normocephalic and atraumatic. Neck:  Supple; no masses or thyromegaly. Lungs:  Clear throughout to auscultation, normal respiratory effort.    Heart:  +S1, +S2, Regular rate and rhythm, No edema. Abdomen:  Soft, nontender and nondistended. Normal bowel sounds, without guarding, and without rebound.   Neurologic:  Alert and  oriented x4;  grossly normal neurologically.  Impression/Plan: [name] Cook is here for an colonoscopy to be performed for surveillance due to prior history of colon polyps   Risks, benefits, limitations, and alternatives regarding  colonoscopy have been reviewed with the patient.  Questions have been answered.  All parties agreeable.   Jonathon Bellows, MD  12/04/2019, 8:36 AM

## 2019-12-04 NOTE — Anesthesia Preprocedure Evaluation (Signed)
Anesthesia Evaluation  Patient identified by MRN, date of birth, ID band Patient awake    Reviewed: Allergy & Precautions, NPO status , Patient's Chart, lab work & pertinent test results  History of Anesthesia Complications Negative for: history of anesthetic complications  Airway Mallampati: III       Dental  (+) Upper Dentures, Lower Dentures   Pulmonary neg shortness of breath, sleep apnea (not using CPAP) , COPD (no inhalers), former smoker,           Cardiovascular hypertension, Pt. on medications + CAD and + Past MI  (-) CHF + dysrhythmias Atrial Fibrillation + pacemaker      Neuro/Psych neg Seizures PSYCHIATRIC DISORDERS Depression    GI/Hepatic Neg liver ROS, GERD  Medicated and Controlled,  Endo/Other  diabetes, Type 2, Oral Hypoglycemic Agents  Renal/GU Renal InsufficiencyRenal disease  negative genitourinary   Musculoskeletal  (+) Arthritis , Osteoarthritis,    Abdominal   Peds negative pediatric ROS (+)  Hematology negative hematology ROS (+)   Anesthesia Other Findings   Reproductive/Obstetrics                             Anesthesia Physical  Anesthesia Plan  ASA: III  Anesthesia Plan: General   Post-op Pain Management:    Induction: Intravenous  PONV Risk Score and Plan: TIVA and Propofol infusion  Airway Management Planned: Nasal Cannula  Additional Equipment:   Intra-op Plan:   Post-operative Plan:   Informed Consent: I have reviewed the patients History and Physical, chart, labs and discussed the procedure including the risks, benefits and alternatives for the proposed anesthesia with the patient or authorized representative who has indicated his/her understanding and acceptance.       Plan Discussed with: CRNA  Anesthesia Plan Comments:         Anesthesia Quick Evaluation

## 2019-12-04 NOTE — Transfer of Care (Signed)
Immediate Anesthesia Transfer of Care Note  Patient: [name] Cook  Procedure(s) Performed: COLONOSCOPY WITH PROPOFOL (N/A )  Patient Location: Endoscopy Unit  Anesthesia Type:General  Level of Consciousness: drowsy and patient cooperative  Airway & Oxygen Therapy: Patient Spontanous Breathing and Patient connected to face mask oxygen  Post-op Assessment: Report given to RN and Post -op Vital signs reviewed and stable  Post vital signs: Reviewed and stable  Last Vitals:  Vitals Value Taken Time  BP    Temp    Pulse 77 12/04/19 0904  Resp 13 12/04/19 0904  SpO2 100 % 12/04/19 0904  Vitals shown include unvalidated device data.  Last Pain:  Vitals:   12/04/19 0753  PainSc: 0-No pain         Complications: No complications documented.

## 2019-12-04 NOTE — Op Note (Signed)
[facility] Gastroenterology Patient Name: [name] Cook Procedure Date: 12/04/2019 8:42 AM MRN: [id number] Account #: [id number] Date of Birth: 1951-03-22 Admit Type: Outpatient Age: 69 Room: [facility] ENDO ROOM 4 Gender: Male Note Status: Finalized Procedure:             Colonoscopy Indications:           Surveillance: History of piecemeal removal adenoma on                         last colonoscopy (< 3 yrs), Last colonoscopy:                         September 2020 Providers:             Jonathon Bellows MD, MD Referring MD:          Champ Mungo. [name], MD (Referring MD), [name]                         (Referring MD) Medicines:             Monitored Anesthesia Care Complications:         No immediate complications. Procedure:             Pre-Anesthesia Assessment:                        - Prior to the procedure, a History and Physical was                         performed, and patient medications, allergies and                         sensitivities were reviewed. The patient's tolerance                         of previous anesthesia was reviewed.                        - The risks and benefits of the procedure and the                         sedation options and risks were discussed with the                         patient. All questions were answered and informed                         consent was obtained.                        - ASA Grade Assessment: II - A patient with mild                         systemic disease.                        After obtaining informed consent, the colonoscope was                         passed under direct vision. Throughout the procedure,  the patient's blood pressure, pulse, and oxygen                         saturations were monitored continuously. The                         Colonoscope was introduced through the anus with the                         intention of advancing to the cecum. The scope was                          advanced to the descending colon before the procedure                         was aborted. Medications were given. The colonoscopy                         was performed with ease. The patient tolerated the                         procedure well. The quality of the bowel preparation                         was poor. Findings:      The perianal and digital rectal examinations were normal.      A large amount of semi-liquid stool was found in the rectum, in the       sigmoid colon and in the descending colon, interfering with       visualization. Impression:            - Preparation of the colon was poor.                        - Stool in the rectum, in the sigmoid colon and in the                         descending colon.                        - No specimens collected. Recommendation:        - Discharge patient to home (with escort).                        - Resume previous diet.                        - Continue present medications.                        - Repeat colonoscopy in 1 week because the bowel                         preparation was suboptimal. Procedure Code(s):     --- Professional ---                        [phone], 53, Colonoscopy, flexible; diagnostic,  including collection of specimen(s) by brushing or                         washing, when performed (separate procedure) Diagnosis Code(s):     --- Professional ---                        Z86.010, Personal history of colonic polyps CPT copyright 2019 American Medical Association. All rights reserved. The codes documented in this report are preliminary and upon coder review may  be revised to meet current compliance requirements. Jonathon Bellows, MD Jonathon Bellows MD, MD 12/04/2019 9:01:17 AM This report has been signed electronically. Number of Addenda: 0 Note Initiated On: 12/04/2019 8:42 AM Total Procedure Duration: 0 hours 3 minutes 6 seconds  Estimated Blood Loss:  Estimated blood loss:  none.      [facility]

## 2019-12-04 NOTE — Progress Notes (Addendum)
   12/04/19 0750  Clinical Encounter Type  Visited With Family  Visit Type Initial  Referral From Chaplain  Consult/Referral To Chaplain  While rounding [facility] waiting area, chaplain spoke with patient's siser-in-law. Patient's sister-in-law told chaplain about the various surgeries she has had. She said that she is a miracle. She also told chaplain that a black doctor saved her life and she calls him her guardian angel. Chaplain wished her well and thanked her for sharing.

## 2019-12-05 ENCOUNTER — Encounter: Payer: Self-pay | Admitting: Gastroenterology

## 2019-12-05 LAB — CUP PACEART REMOTE DEVICE CHECK
Battery Impedance: 495 Ohm
Battery Remaining Longevity: 94 mo
Battery Voltage: 2.79 V
Brady Statistic RV Percent Paced: 100 %
Date Time Interrogation Session: 20210818173211
Implantable Lead Implant Date: 20141010
Implantable Lead Implant Date: 20141010
Implantable Lead Location: 753859
Implantable Lead Location: 753860
Implantable Lead Model: 5076
Implantable Lead Model: 5076
Implantable Pulse Generator Implant Date: 20141010
Lead Channel Impedance Value: 664 Ohm
Lead Channel Impedance Value: 67 Ohm
Lead Channel Pacing Threshold Amplitude: 1 V
Lead Channel Pacing Threshold Pulse Width: 0.4 ms
Lead Channel Setting Pacing Amplitude: 2.5 V
Lead Channel Setting Pacing Pulse Width: 0.4 ms
Lead Channel Setting Sensing Sensitivity: 4 mV

## 2019-12-05 NOTE — Anesthesia Postprocedure Evaluation (Signed)
Anesthesia Post Note  Patient: [name] Cook  Procedure(s) Performed: COLONOSCOPY WITH PROPOFOL (N/A )  Patient location during evaluation: Endoscopy Anesthesia Type: General Level of consciousness: awake and alert and oriented Pain management: pain level controlled Vital Signs Assessment: post-procedure vital signs reviewed and stable Respiratory status: spontaneous breathing Cardiovascular status: blood pressure returned to baseline Anesthetic complications: no   No complications documented.   Last Vitals:  Vitals:   12/04/19 0920 12/04/19 0930  BP: 93/71 92/67  Pulse: 73 72  Resp: 16 13  Temp:    SpO2: 100% 97%    Last Pain:  Vitals:   12/04/19 0753  PainSc: 0-No pain                 Kaid Seeberger

## 2019-12-05 NOTE — Progress Notes (Signed)
Remote pacemaker transmission.   

## 2019-12-09 ENCOUNTER — Other Ambulatory Visit: Payer: Self-pay | Admitting: Primary Care

## 2019-12-09 ENCOUNTER — Ambulatory Visit: Payer: Medicare Other

## 2019-12-09 ENCOUNTER — Other Ambulatory Visit: Payer: Self-pay

## 2019-12-09 ENCOUNTER — Ambulatory Visit (INDEPENDENT_AMBULATORY_CARE_PROVIDER_SITE_OTHER): Payer: Medicare Other

## 2019-12-09 DIAGNOSIS — Z7901 Long term (current) use of anticoagulants: Secondary | ICD-10-CM

## 2019-12-09 DIAGNOSIS — F339 Major depressive disorder, recurrent, unspecified: Secondary | ICD-10-CM

## 2019-12-09 LAB — POCT INR: INR: 1.3 — AB (ref 2.0–3.0)

## 2019-12-09 NOTE — Patient Instructions (Addendum)
Pre visit review using our clinic review tool, if applicable. No additional management support is needed unless otherwise documented below in the visit note.  Increase dose today and tomorrow to 5mg  and then continue normal dosing. Recheck in 1 wks.

## 2019-12-09 NOTE — Telephone Encounter (Signed)
Received fax from [employer], pt assistance program for Durolane injections that pt has been approved for assistance. Also received call from [facility] [place] who reports the [place] will ship and be delivered tomorrow by [employer] between 1030 and 11. Phone number for the pharmacy is [phone]. They will be shipping Durolane

## 2019-12-09 NOTE — Telephone Encounter (Signed)
Noted  

## 2019-12-15 ENCOUNTER — Ambulatory Visit (INDEPENDENT_AMBULATORY_CARE_PROVIDER_SITE_OTHER): Payer: Medicare Other | Admitting: Family Medicine

## 2019-12-15 ENCOUNTER — Other Ambulatory Visit: Payer: Self-pay

## 2019-12-15 ENCOUNTER — Encounter: Payer: Self-pay | Admitting: Family Medicine

## 2019-12-15 VITALS — BP 94/63 | HR 81 | Temp 97.9°F | Ht 71.0 in | Wt 232.5 lb

## 2019-12-15 DIAGNOSIS — M170 Bilateral primary osteoarthritis of knee: Secondary | ICD-10-CM | POA: Diagnosis not present

## 2019-12-15 MED ORDER — SODIUM HYALURONATE 60 MG/3ML IX PRSY
60.0000 mg | PREFILLED_SYRINGE | Freq: Once | INTRA_ARTICULAR | Status: AC
  Administered 2019-12-15: 60 mg via INTRA_ARTICULAR

## 2019-12-15 NOTE — Progress Notes (Signed)
° ° °  Shamell Suarez T. Rosiland Sen, MD, [facility]  Primary Care and Sports Medicine [facility] [place], [address]  Phone: [phone]   FAX: [phone]  [name] Cook - 69 y.o. male   MRN [id number]   Date of Birth: 1951-03-11  Date: 12/15/2019   PCP: [name], NP   Referral: [name], NP  Chief Complaint  Patient presents with   Knee Pain    Bilateral Durolane injections    This visit occurred during the SARS-CoV-2 public health emergency.  Safety protocols were in place, including screening questions prior to the visit, additional usage of staff PPE, and extensive cleaning of exam room while observing appropriate contact time as indicated for disinfecting solutions.    Procedure only, Bilateral  Aspiration/Injection Procedure Note [name] Cook [birth year] Date of procedure: 12/15/2019  Procedure: Large Joint Aspiration / Injection of Knee for Viscosupplementation, R Medication: Durolane 60 mg / 3 mL Indications: Pain J7318, # 60 units  Procedure Details Patient verbally consented to procedure. Risks (including infection), benefits, and alternatives explained. Sterilely prepped with Chloraprep. Ethyl cholride used for anesthesia, then 7 cc of Lidocaine 1% used for anesthesia in the anterolateral position. Reprepped with Chloraprep.  Anteromedial approach used to inject joint without difficulty, injected with Durolane 60 mg/3 mL. No complications with procedure and tolerated well.  Aspiration/Injection Procedure Note [name] Cook [birth year] Date of procedure: 12/15/2019  Procedure: Large Joint Aspiration / Injection of Knee for Viscosupplementation, L Medication: Durolane 60 mg / 3 mL Indications: Pain J7318, # 60 units  Procedure Details Patient verbally consented to procedure. Risks (including infection), benefits, and alternatives explained. Sterilely prepped with Chloraprep. Ethyl cholride used for  anesthesia, then 7 cc of Lidocaine 1% used for anesthesia in the anterolateral position. Reprepped with Chloraprep.  Anteromedial approach used to inject joint without difficulty, injected with Durolane 60 mg/3 mL. No complications with procedure and tolerated well.   Signed,  Maud Deed. Detrice Cales, MD     ICD-10-CM   1. Primary osteoarthritis of knees, bilateral  M17.0 Sodium Hyaluronate PRSY 60 mg    Sodium Hyaluronate PRSY 60 mg    Follow-up: No follow-ups on file.  Meds ordered this encounter  Medications   Sodium Hyaluronate PRSY 60 mg   Sodium Hyaluronate PRSY 60 mg

## 2019-12-17 ENCOUNTER — Other Ambulatory Visit: Payer: Self-pay | Admitting: Primary Care

## 2019-12-17 DIAGNOSIS — F339 Major depressive disorder, recurrent, unspecified: Secondary | ICD-10-CM

## 2019-12-18 ENCOUNTER — Other Ambulatory Visit: Payer: Self-pay

## 2019-12-18 ENCOUNTER — Ambulatory Visit (INDEPENDENT_AMBULATORY_CARE_PROVIDER_SITE_OTHER): Payer: Medicare Other

## 2019-12-18 DIAGNOSIS — Z7901 Long term (current) use of anticoagulants: Secondary | ICD-10-CM | POA: Diagnosis not present

## 2019-12-18 LAB — POCT INR: INR: 2.1 (ref 2.0–3.0)

## 2019-12-18 NOTE — Patient Instructions (Addendum)
Pre visit review using our clinic review tool, if applicable. No additional management support is needed unless otherwise documented below in the visit note.  Continue taking 2.5mg  daily except take 5mg  on Monday. . Recheck in 3 wks.

## 2019-12-19 ENCOUNTER — Other Ambulatory Visit: Payer: Self-pay | Admitting: Primary Care

## 2019-12-19 DIAGNOSIS — F339 Major depressive disorder, recurrent, unspecified: Secondary | ICD-10-CM

## 2019-12-23 ENCOUNTER — Other Ambulatory Visit: Payer: Self-pay | Admitting: Primary Care

## 2019-12-23 DIAGNOSIS — I4819 Other persistent atrial fibrillation: Secondary | ICD-10-CM

## 2019-12-23 DIAGNOSIS — Z7901 Long term (current) use of anticoagulants: Secondary | ICD-10-CM

## 2019-12-26 ENCOUNTER — Other Ambulatory Visit: Payer: Self-pay | Admitting: Primary Care

## 2019-12-26 DIAGNOSIS — E119 Type 2 diabetes mellitus without complications: Secondary | ICD-10-CM

## 2019-12-31 ENCOUNTER — Other Ambulatory Visit: Payer: Self-pay | Admitting: Primary Care

## 2019-12-31 DIAGNOSIS — K219 Gastro-esophageal reflux disease without esophagitis: Secondary | ICD-10-CM

## 2020-01-01 ENCOUNTER — Other Ambulatory Visit: Payer: Self-pay | Admitting: Family Medicine

## 2020-01-05 ENCOUNTER — Telehealth: Payer: Self-pay

## 2020-01-05 NOTE — Telephone Encounter (Signed)
Patient left voicemail wanting to cancel his procedure for 01/15/20. Patient states he will call the first of the year to reschedule.

## 2020-01-07 ENCOUNTER — Other Ambulatory Visit: Payer: Self-pay

## 2020-01-07 ENCOUNTER — Other Ambulatory Visit (INDEPENDENT_AMBULATORY_CARE_PROVIDER_SITE_OTHER): Payer: Medicare Other

## 2020-01-07 ENCOUNTER — Ambulatory Visit (INDEPENDENT_AMBULATORY_CARE_PROVIDER_SITE_OTHER): Payer: Medicare Other

## 2020-01-07 DIAGNOSIS — Z7901 Long term (current) use of anticoagulants: Secondary | ICD-10-CM | POA: Diagnosis not present

## 2020-01-07 DIAGNOSIS — E119 Type 2 diabetes mellitus without complications: Secondary | ICD-10-CM

## 2020-01-07 LAB — POCT INR: INR: 2.4 (ref 2.0–3.0)

## 2020-01-07 LAB — POCT GLYCOSYLATED HEMOGLOBIN (HGB A1C): Hemoglobin A1C: 6.5 % — AB (ref 4.0–5.6)

## 2020-01-07 NOTE — Patient Instructions (Addendum)
Pre visit review using our clinic review tool, if applicable. No additional management support is needed unless otherwise documented below in the visit note.  Continue taking 2.5mg  daily except take 5mg  on Monday. . Recheck in 4 wks. Pt has decided to postpone his colonoscopy until the beginning of the year due to covid concerns. He will let the coumadin clinic know when it will be scheduled.

## 2020-01-08 ENCOUNTER — Other Ambulatory Visit: Payer: Medicare Other

## 2020-01-08 ENCOUNTER — Ambulatory Visit: Payer: Medicare Other

## 2020-01-08 ENCOUNTER — Inpatient Hospital Stay: Payer: Medicare Other | Attending: Radiation Oncology

## 2020-01-08 DIAGNOSIS — C61 Malignant neoplasm of prostate: Secondary | ICD-10-CM | POA: Diagnosis present

## 2020-01-08 LAB — PSA: Prostatic Specific Antigen: 0.01 ng/mL (ref 0.00–4.00)

## 2020-01-09 ENCOUNTER — Other Ambulatory Visit: Payer: Medicare Other

## 2020-01-15 ENCOUNTER — Encounter: Payer: Self-pay | Admitting: Radiation Oncology

## 2020-01-15 ENCOUNTER — Ambulatory Visit: Admit: 2020-01-15 | Payer: Medicare Other | Admitting: Gastroenterology

## 2020-01-15 ENCOUNTER — Other Ambulatory Visit: Payer: Self-pay | Admitting: *Deleted

## 2020-01-15 ENCOUNTER — Ambulatory Visit
Admission: RE | Admit: 2020-01-15 | Discharge: 2020-01-15 | Disposition: A | Discharge status: Home / Self Care | Payer: Medicare Other | Source: Ambulatory Visit | Attending: Radiation Oncology | Admitting: Radiation Oncology

## 2020-01-15 ENCOUNTER — Other Ambulatory Visit: Payer: Self-pay | Admitting: Primary Care

## 2020-01-15 ENCOUNTER — Other Ambulatory Visit: Payer: Self-pay

## 2020-01-15 VITALS — BP 105/77 | HR 89 | Temp 95.0°F | Resp 16 | Wt 234.9 lb

## 2020-01-15 DIAGNOSIS — Z923 Personal history of irradiation: Secondary | ICD-10-CM | POA: Diagnosis not present

## 2020-01-15 DIAGNOSIS — R197 Diarrhea, unspecified: Secondary | ICD-10-CM | POA: Insufficient documentation

## 2020-01-15 DIAGNOSIS — C61 Malignant neoplasm of prostate: Secondary | ICD-10-CM | POA: Diagnosis present

## 2020-01-15 SURGERY — COLONOSCOPY WITH PROPOFOL
Anesthesia: General

## 2020-01-15 NOTE — Progress Notes (Signed)
Radiation Oncology Follow up Note  Name: [name] Cook   Date:   01/15/2020 MRN:  [id number] DOB: July 14, 1950    This 69 y.o. male presents to the clinic today for 65-month follow-up status post IMRT radiation therapy to his prostate for Gleason 7 adenocarcinoma.  REFERRING PROVIDER: [name], NP  HPI: Patient is a 69 year old male now out 16 months having completed IMRT radiation therapy to his prostate for Gleason 7 (3+4) adenocarcinoma prostate presenting with a PSA of 6.1.  Seen today in routine follow-up he is doing well.  He specifically denies any increased lower urinary tract symptoms or diarrhea.Marland Kitchen  He is currently on Flomax twice a day.  His most recent PSA is less than 0.53  COMPLICATIONS OF TREATMENT: none  FOLLOW UP COMPLIANCE: keeps appointments   PHYSICAL EXAM:  BP 105/77 (BP Location: Left Arm, Patient Position: Sitting)   Pulse 89   Temp (!) 95 F (35 C) (Tympanic)   Resp 16   Wt 234 lb 14.4 oz (106.5 kg)   BMI 32.76 kg/m  Well-developed well-nourished patient in NAD. HEENT reveals PERLA, EOMI, discs not visualized.  Oral cavity is clear. No oral mucosal lesions are identified. Neck is clear without evidence of cervical or supraclavicular adenopathy. Lungs are clear to A&P. Cardiac examination is essentially unremarkable with regular rate and rhythm without murmur rub or thrill. Abdomen is benign with no organomegaly or masses noted. Motor sensory and DTR levels are equal and symmetric in the upper and lower extremities. Cranial nerves II through XII are grossly intact. Proprioception is intact. No peripheral adenopathy or edema is identified. No motor or sensory levels are noted. Crude visual fields are within normal range.  RADIOLOGY RESULTS: No current films to review  PLAN: At the present time patient is doing well under excellent biochemical control of his prostate cancer.  I am pleased with his overall progress he is a low side effect profile.  Of asked to see  him back in 1 year with a PSA.  Patient knows to call sooner with any concerns.  I would like to take this opportunity to thank you for allowing me to participate in the care of your patient.Noreene Filbert, MD

## 2020-01-17 ENCOUNTER — Other Ambulatory Visit: Payer: Self-pay | Admitting: Radiation Oncology

## 2020-01-20 IMAGING — CT CT ANGIO CHEST
2 of 6 series · 18 of 36 positions shown · IV contrast (iopamidol)
Comparison: CXR 06/04/2018

CLINICAL DATA: Left-sided chest pain beginning last night radiating
to the left arm.

EXAM:
CT ANGIOGRAPHY CHEST WITH CONTRAST
TECHNIQUE: Multidetector CT imaging of the chest was performed using the
standard protocol during bolus administration of intravenous
contrast. Multiplanar CT image reconstructions and MIPs were
obtained to evaluate the vascular anatomy.
CONTRAST:  100mL NKQU7T-I7G IOPAMIDOL (NKQU7T-I7G) INJECTION 76%

[Series 7: pe thins · axial · 0.91mm/px · z∈[+1152,+1333]mm · 17 of 289 slices shown]
[im 15/289  lung]
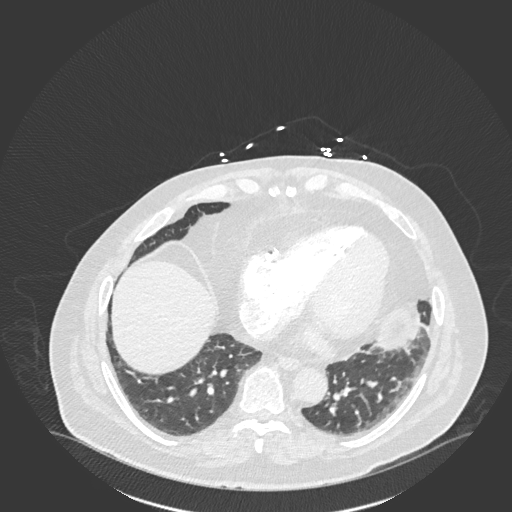
[im 29/289  mediastinal]
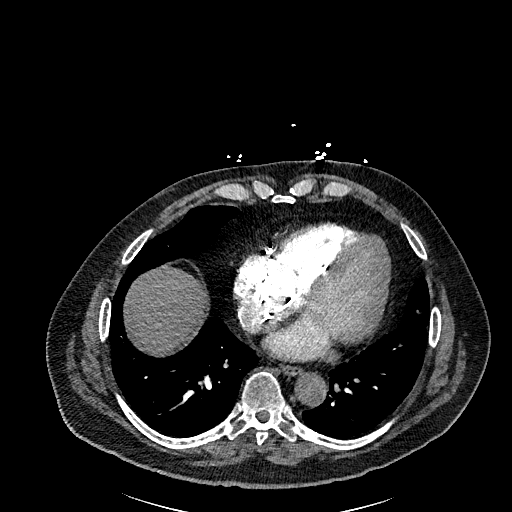
[im 44/289  lung]
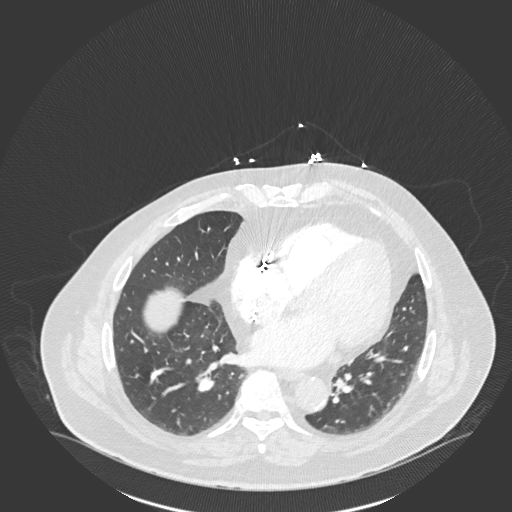
[im 58/289  mediastinal]
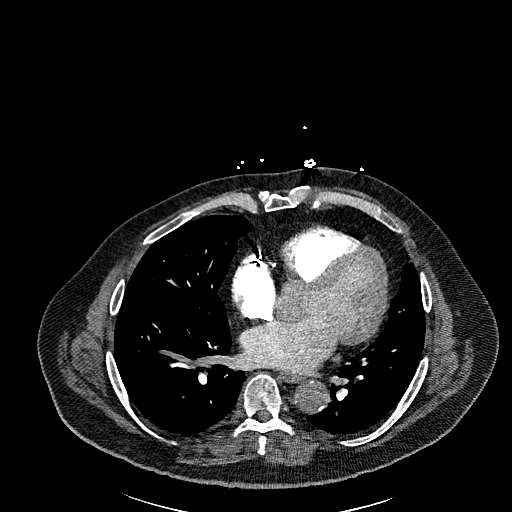
[im 87/289  lung]
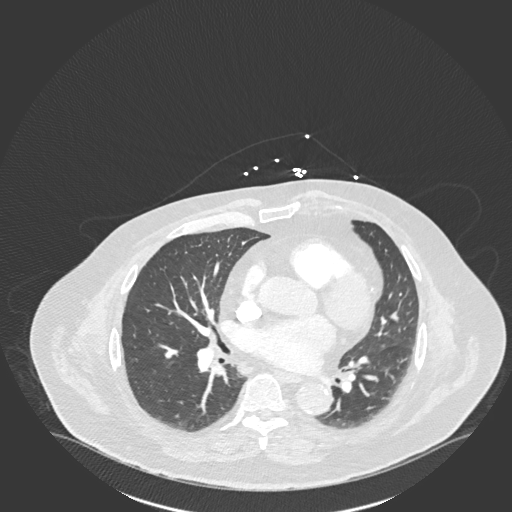
[im 101/289  mediastinal]
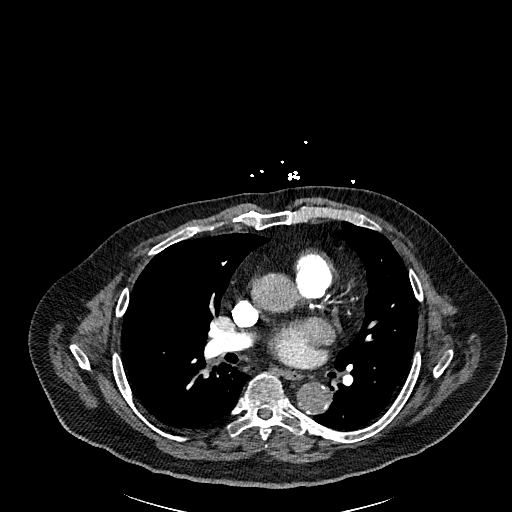
[im 116/289  lung]
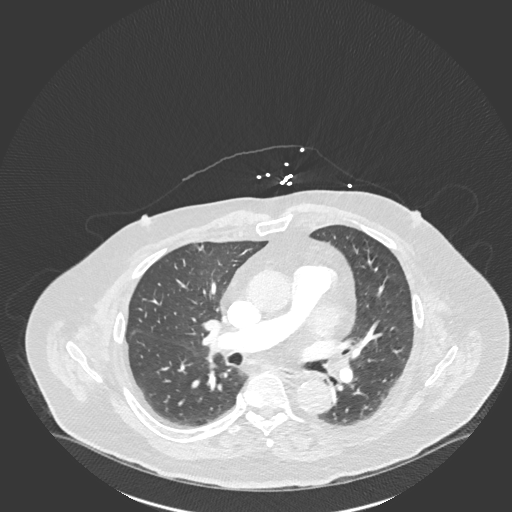
[im 130/289  mediastinal]
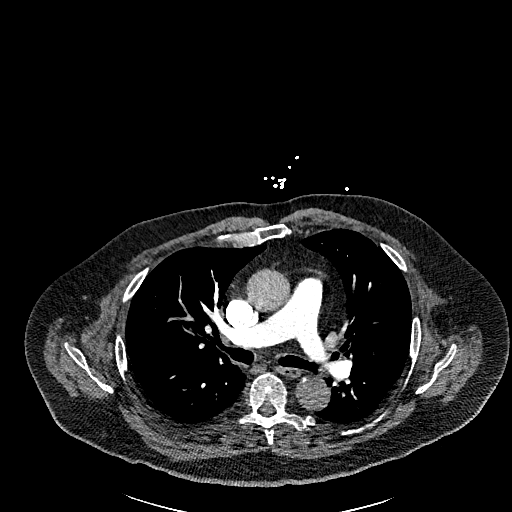
[im 145/289  lung]
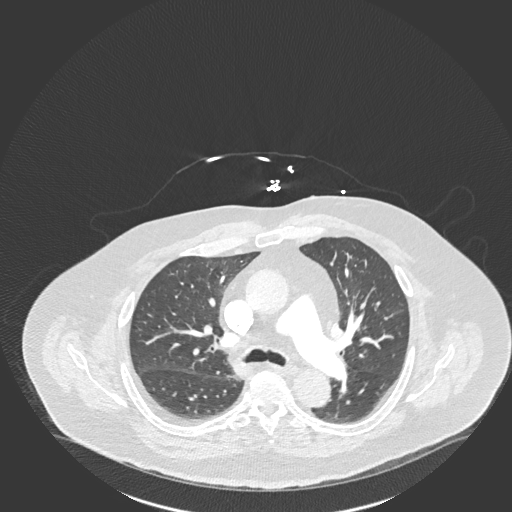
[im 159/289  mediastinal]
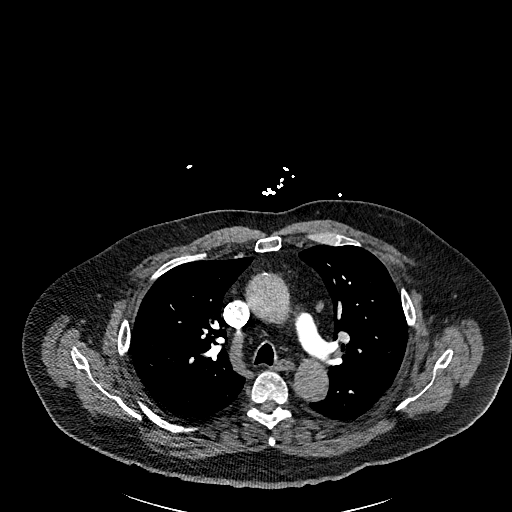
[im 173/289  lung]
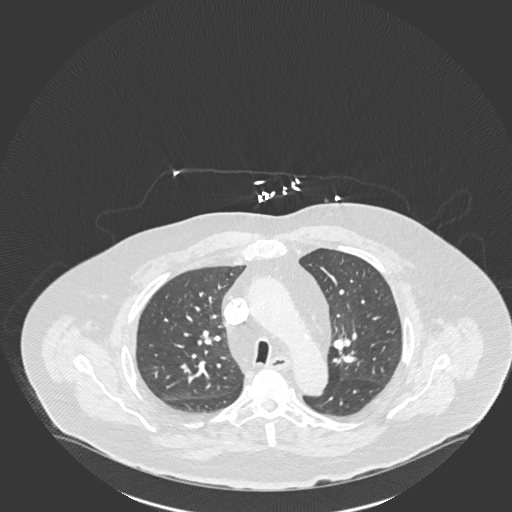
[im 188/289  mediastinal]
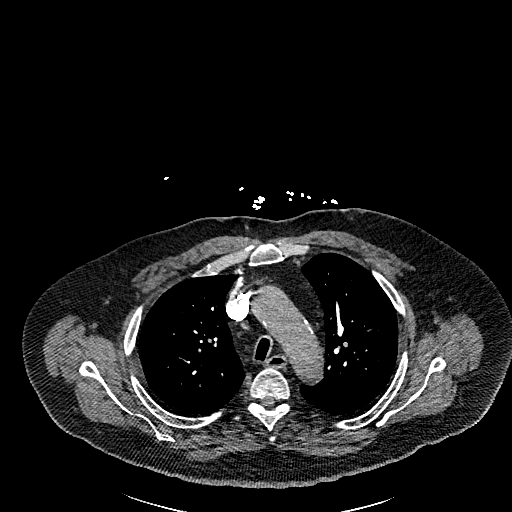
[im 202/289  lung]
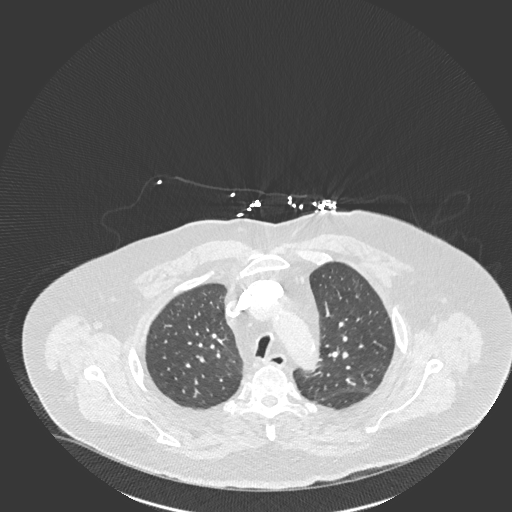
[im 231/289  mediastinal]
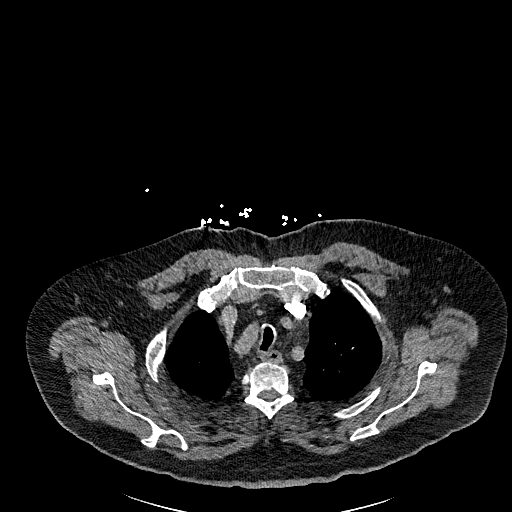
[im 245/289  lung]
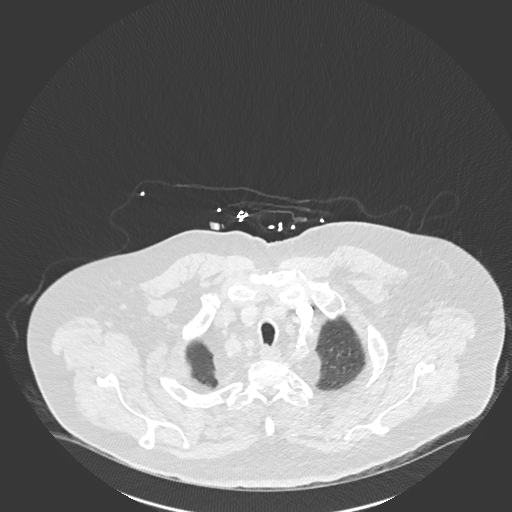
[im 260/289  mediastinal]
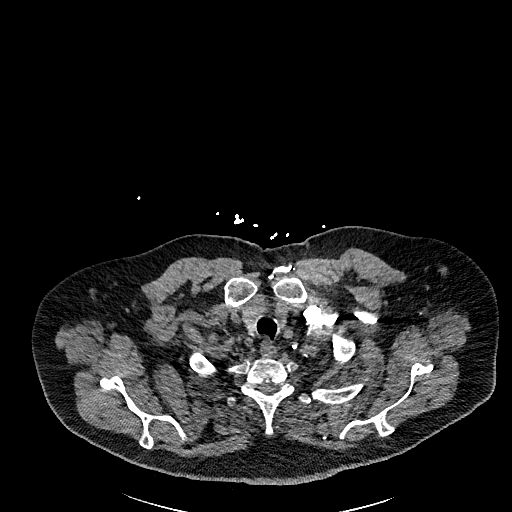
[im 274/289  lung]
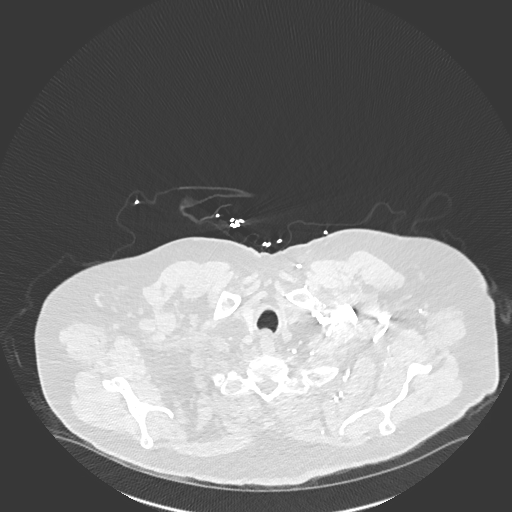

[Series 8: pe 2mm cor · coronal · 0.44mm/px · 1 of 165 slices shown]
[im 83/165  mediastinal]
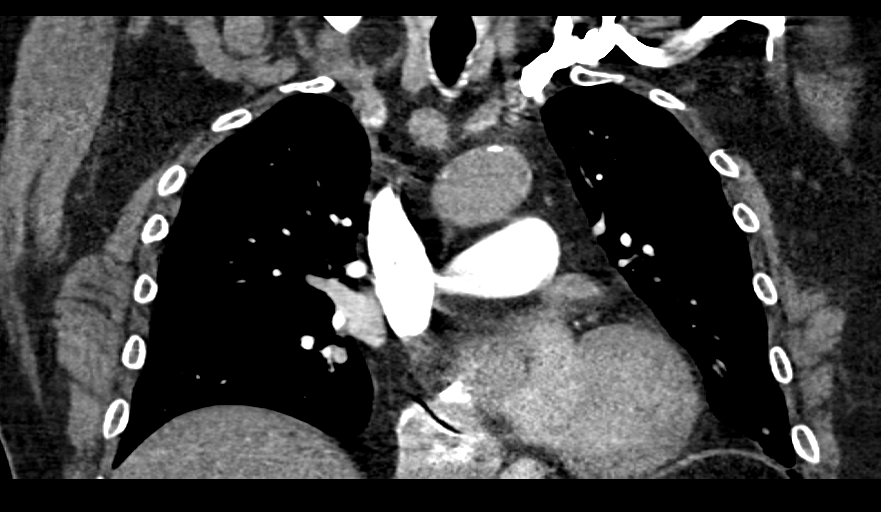

[18 of 36 positions shown; findings below may reference images not displayed]

FINDINGS: Cardiovascular: Satisfactory opacification of the pulmonary arteries
to the segmental level. No evidence of pulmonary embolism. Normal
heart size. No pericardial effusion. Aortic atherosclerosis without
aneurysm. Atherosclerosis of the great vessels without acute
abnormality. Left-sided pacemaker apparatus with leads in the right
atrium right ventricle are identified, best visualized on the scout
view.

Mediastinum/Nodes: No enlarged mediastinal, hilar, or axillary lymph
nodes. Thyroid gland, trachea, and esophagus demonstrate no
significant findings.

Lungs/Pleura: Passive atelectasis and subsegmental atelectasis,
greatest at the left lung base. A few tiny subpleural blebs are
noted at the right lung apex. No pneumothorax or dominant mass. No
focal pulmonary consolidation.

Upper Abdomen: No acute abnormality.

Musculoskeletal: No chest wall abnormality. No acute or significant
osseous findings.

Review of the MIP images confirms the above findings.
IMPRESSION: 1. No acute pulmonary embolus, aortic aneurysm or dissection.
2. Passive atelectasis and subsegmental atelectasis, greatest at the
left lung base.

Aortic Atherosclerosis ([id number]).

## 2020-01-22 ENCOUNTER — Other Ambulatory Visit: Payer: Self-pay | Admitting: Family Medicine

## 2020-01-23 ENCOUNTER — Emergency Department: Payer: Medicare Other

## 2020-01-23 ENCOUNTER — Encounter: Payer: Self-pay | Admitting: Emergency Medicine

## 2020-01-23 DIAGNOSIS — R1032 Left lower quadrant pain: Secondary | ICD-10-CM | POA: Diagnosis present

## 2020-01-23 DIAGNOSIS — Z87891 Personal history of nicotine dependence: Secondary | ICD-10-CM | POA: Diagnosis not present

## 2020-01-23 DIAGNOSIS — Z7901 Long term (current) use of anticoagulants: Secondary | ICD-10-CM | POA: Diagnosis not present

## 2020-01-23 DIAGNOSIS — K5792 Diverticulitis of intestine, part unspecified, without perforation or abscess without bleeding: Secondary | ICD-10-CM | POA: Insufficient documentation

## 2020-01-23 DIAGNOSIS — K8020 Calculus of gallbladder without cholecystitis without obstruction: Secondary | ICD-10-CM | POA: Diagnosis not present

## 2020-01-23 DIAGNOSIS — I714 Abdominal aortic aneurysm, without rupture: Secondary | ICD-10-CM | POA: Insufficient documentation

## 2020-01-23 DIAGNOSIS — R0602 Shortness of breath: Secondary | ICD-10-CM | POA: Insufficient documentation

## 2020-01-23 DIAGNOSIS — R9431 Abnormal electrocardiogram [ECG] [EKG]: Secondary | ICD-10-CM | POA: Insufficient documentation

## 2020-01-23 DIAGNOSIS — Z950 Presence of cardiac pacemaker: Secondary | ICD-10-CM | POA: Diagnosis not present

## 2020-01-23 DIAGNOSIS — J90 Pleural effusion, not elsewhere classified: Secondary | ICD-10-CM | POA: Diagnosis not present

## 2020-01-23 DIAGNOSIS — I2510 Atherosclerotic heart disease of native coronary artery without angina pectoris: Secondary | ICD-10-CM | POA: Diagnosis not present

## 2020-01-23 DIAGNOSIS — N182 Chronic kidney disease, stage 2 (mild): Secondary | ICD-10-CM | POA: Insufficient documentation

## 2020-01-23 DIAGNOSIS — Z20822 Contact with and (suspected) exposure to covid-19: Secondary | ICD-10-CM | POA: Insufficient documentation

## 2020-01-23 DIAGNOSIS — I129 Hypertensive chronic kidney disease with stage 1 through stage 4 chronic kidney disease, or unspecified chronic kidney disease: Secondary | ICD-10-CM | POA: Diagnosis not present

## 2020-01-23 DIAGNOSIS — K760 Fatty (change of) liver, not elsewhere classified: Secondary | ICD-10-CM | POA: Insufficient documentation

## 2020-01-23 DIAGNOSIS — E1165 Type 2 diabetes mellitus with hyperglycemia: Secondary | ICD-10-CM | POA: Diagnosis not present

## 2020-01-23 DIAGNOSIS — K5732 Diverticulitis of large intestine without perforation or abscess without bleeding: Secondary | ICD-10-CM | POA: Diagnosis not present

## 2020-01-23 DIAGNOSIS — I700 Atherosclerosis of aorta: Secondary | ICD-10-CM | POA: Diagnosis not present

## 2020-01-23 DIAGNOSIS — R0600 Dyspnea, unspecified: Secondary | ICD-10-CM | POA: Diagnosis not present

## 2020-01-23 LAB — CBC
HCT: 45.2 % (ref 39.0–52.0)
Hemoglobin: 15.6 g/dL (ref 13.0–17.0)
MCH: 32.8 pg (ref 26.0–34.0)
MCHC: 34.5 g/dL (ref 30.0–36.0)
MCV: 95 fL (ref 80.0–100.0)
Platelets: 128 10*3/uL — ABNORMAL LOW (ref 150–400)
RBC: 4.76 MIL/uL (ref 4.22–5.81)
RDW: 13.5 % (ref 11.5–15.5)
WBC: 7.3 10*3/uL (ref 4.0–10.5)
nRBC: 0 % (ref 0.0–0.2)

## 2020-01-23 LAB — COMPREHENSIVE METABOLIC PANEL
ALT: 19 U/L (ref 0–44)
AST: 20 U/L (ref 15–41)
Albumin: 4.3 g/dL (ref 3.5–5.0)
Alkaline Phosphatase: 55 U/L (ref 38–126)
Anion gap: 11 (ref 5–15)
BUN: 17 mg/dL (ref 8–23)
CO2: 24 mmol/L (ref 22–32)
Calcium: 8.9 mg/dL (ref 8.9–10.3)
Chloride: 103 mmol/L (ref 98–111)
Creatinine, Ser: 1.12 mg/dL (ref 0.61–1.24)
GFR, Estimated: >60 mL/min (ref >60)
Glucose, Bld: 206 mg/dL — ABNORMAL HIGH (ref 70–99)
Potassium: 3.7 mmol/L (ref 3.5–5.1)
Sodium: 138 mmol/L (ref 135–145)
Total Bilirubin: 0.9 mg/dL (ref 0.3–1.2)
Total Protein: 7.2 g/dL (ref 6.5–8.1)

## 2020-01-23 LAB — LIPASE, BLOOD: Lipase: 39 U/L (ref 11–51)

## 2020-01-23 NOTE — ED Triage Notes (Signed)
Pt c/o lower right flank pain that started in lower back and is now moving to the lower front of abdomin with worsening pain. Pain started in lower back 2 days ago and increased this evening. Denies urinary symptoms and fever.

## 2020-01-24 ENCOUNTER — Emergency Department
Admission: EM | Admit: 2020-01-24 | Discharge: 2020-01-24 | Disposition: A | Discharge status: Home / Self Care | Payer: Medicare Other | Attending: Emergency Medicine | Admitting: Emergency Medicine

## 2020-01-24 ENCOUNTER — Other Ambulatory Visit: Payer: Self-pay

## 2020-01-24 ENCOUNTER — Emergency Department: Payer: Medicare Other

## 2020-01-24 DIAGNOSIS — I7090 Unspecified atherosclerosis: Secondary | ICD-10-CM

## 2020-01-24 DIAGNOSIS — I714 Abdominal aortic aneurysm, without rupture, unspecified: Secondary | ICD-10-CM

## 2020-01-24 DIAGNOSIS — R739 Hyperglycemia, unspecified: Secondary | ICD-10-CM

## 2020-01-24 DIAGNOSIS — R0600 Dyspnea, unspecified: Secondary | ICD-10-CM | POA: Diagnosis not present

## 2020-01-24 DIAGNOSIS — Z7901 Long term (current) use of anticoagulants: Secondary | ICD-10-CM

## 2020-01-24 DIAGNOSIS — K8020 Calculus of gallbladder without cholecystitis without obstruction: Secondary | ICD-10-CM

## 2020-01-24 DIAGNOSIS — K760 Fatty (change of) liver, not elsewhere classified: Secondary | ICD-10-CM

## 2020-01-24 DIAGNOSIS — J90 Pleural effusion, not elsewhere classified: Secondary | ICD-10-CM | POA: Diagnosis not present

## 2020-01-24 DIAGNOSIS — R9431 Abnormal electrocardiogram [ECG] [EKG]: Secondary | ICD-10-CM

## 2020-01-24 DIAGNOSIS — K5792 Diverticulitis of intestine, part unspecified, without perforation or abscess without bleeding: Secondary | ICD-10-CM

## 2020-01-24 LAB — RESPIRATORY PANEL BY RT PCR (FLU A&B, COVID)
Influenza A by PCR: NEGATIVE
Influenza B by PCR: NEGATIVE
SARS Coronavirus 2 by RT PCR: NEGATIVE

## 2020-01-24 LAB — URINALYSIS, COMPLETE (UACMP) WITH MICROSCOPIC
Bacteria, UA: NONE SEEN
Bilirubin Urine: NEGATIVE
Glucose, UA: >=500 mg/dL — AB
Hgb urine dipstick: NEGATIVE
Ketones, ur: NEGATIVE mg/dL
Leukocytes,Ua: NEGATIVE
Nitrite: NEGATIVE
Protein, ur: NEGATIVE mg/dL
Specific Gravity, Urine: 1.024 (ref 1.005–1.030)
WBC, UA: NONE SEEN WBC/hpf (ref 0–5)
pH: 6 (ref 5.0–8.0)

## 2020-01-24 LAB — BRAIN NATRIURETIC PEPTIDE: B Natriuretic Peptide: 70.1 pg/mL (ref 0.0–100.0)

## 2020-01-24 LAB — PROCALCITONIN: Procalcitonin: <0.1 ng/mL

## 2020-01-24 MED ORDER — ACETAMINOPHEN 500 MG PO TABS
1000.0000 mg | ORAL_TABLET | Freq: Once | ORAL | Status: AC
  Administered 2020-01-24: 1000 mg via ORAL
  Filled 2020-01-24: qty 2

## 2020-01-24 MED ORDER — AMOXICILLIN-POT CLAVULANATE 875-125 MG PO TABS: 1.0000 | ORAL_TABLET | Freq: Two times a day (BID) | ORAL | 0 refills | Status: AC

## 2020-01-24 MED ORDER — NAPROXEN 500 MG PO TABS
500.0000 mg | ORAL_TABLET | Freq: Once | ORAL | Status: AC
  Administered 2020-01-24: 500 mg via ORAL
  Filled 2020-01-24: qty 1

## 2020-01-24 NOTE — ED Provider Notes (Signed)
[facility] Emergency Department Provider Note  ____________________________________________   First MD Initiated Contact with Patient 01/24/20 0116     (approximate)  I have reviewed the triage vital signs and the nursing notes.   HISTORY  Chief Complaint Flank Pain   HPI [name] Cook is a 69 y.o. male with a past medical history of A. fib on Coumadin, CAD, COPD, HTN, HDL, obesity, tobacco abuse, and OSA who presents for assessment of approximately 2 days of left lower quadrant abdominal pain rating to his back and pelvis.  No prior similar episodes.  No clear alleviating or aggravating factors.  Patient denies any right-sided abdominal pain, scrotal or testicular pain, burning with urination, vomiting, diarrhea, rash, or back pain.  States he has had a cough for the past week and a little shortness of breath with exertion but no chest pain, hemoptysis, earache, sore throat, fevers, chills, or other acute complaints.  He states he is fully vaccinated against Covid.         Past Medical History:  Diagnosis Date  . Arthritis    "knees and lower back" (03/14/2013)  . Atrial flutter ([place])    radiofrequency ablation in 2001  . CAD (coronary artery disease)    a. Nonobstructive. Cardiac cath in 2001-50% mid RI, normal LM, LAD, RCA b. cath 10/16/2014 95% mid RCA treated with DES, 99% ost D1 medical management due to small aneurysmal segment  . Chronic anticoagulation    chronic Coumadin anticoagulation  . Chronic obstructive pulmonary disease ([place]) 04/20/2011  . Diabetes mellitus, type 2 ([place])   . GERD (gastroesophageal reflux disease)   . Hyperlipidemia   . Hypertension    with hypertensive heart disease  . Left knee pain 10/25/2017   medial  . Obesity   . Persistent atrial fibrillation (HCC)    recurrent atrial flutter since 2001 s/p DCCVs, multiple failed AADs, h/o tachy-mediated cardiomyopathy  . Shortness of breath    "can come on at any time"  (03/14/2013)  . Sleep apnea    "dx'd; couldn't wear the mask" (03/14/2013)  . Tobacco abuse     Patient Active Problem List   Diagnosis Date Noted  . Preventative health care 10/09/2019  . Ear canal dryness, bilateral 08/12/2019  . Polyp of colon   . Decreased renal function 06/27/2018  . Prostate cancer ([place]) 06/10/2018  . Chest pain 06/04/2018  . OSA (obstructive sleep apnea) 10/01/2017  . Encounter for screening colonoscopy 10/01/2017  . Osteoarthritis 06/26/2017  . Insomnia 10/01/2014  . Depression 03/09/2014  . Coronary artery disease 11/06/2013  . Long term current use of anticoagulant therapy 06/19/2013  . Pacemaker 04/30/2013  . Acute myocardial infarction, subendocardial infarction, initial episode of care ([place]) 03/15/2013  . Abnormal LFTs 05/02/2012  . Chronic kidney disease, 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/26/2011  . Chronic obstructive pulmonary disease ([place]) 04/20/2011  . Atrial fibrillation   . Hypertension   . Hyperlipidemia   . Diabetes mellitus type 2, uncomplicated ([place])   . Atrial flutter ([place])   . GASTROESOPHAGEAL REFLUX DISEASE 05/16/2010    Past Surgical History:  Procedure Laterality Date  . ATRIAL FLUTTER ABLATION  2002   atrial flutter; subsequently developed atrial fibrillation  . AV NODE ABLATION  01/24/2013  . CARDIAC CATHETERIZATION  2002  . CARDIAC CATHETERIZATION N/A 10/16/2014   Procedure: Left Heart Cath and Coronary Angiography;  Surgeon: [name], MD;  Location: [facility];  Service: Cardiovascular;  Laterality: N/A;  . CARDIOVERSION  05/31/2011   Procedure: CARDIOVERSION;  Surgeon: Cristopher Estimable. Lattie Haw, MD;  Location: [facility];  Service: Cardiovascular;  Laterality: N/A;  . CARPAL TUNNEL RELEASE Left 1980's  . COLONOSCOPY WITH PROPOFOL N/A 12/30/2018   Procedure: COLONOSCOPY WITH PROPOFOL;  Surgeon: Jonathon Bellows, MD;  Location: [facility] ENDOSCOPY;  Service: Gastroenterology;  Laterality: N/A;  . COLONOSCOPY WITH PROPOFOL N/A 12/04/2019    Procedure: COLONOSCOPY WITH PROPOFOL;  Surgeon: Jonathon Bellows, MD;  Location: [facility] ENDOSCOPY;  Service: Gastroenterology;  Laterality: N/A;  . INSERT / REPLACE / REMOVE PACEMAKER  01/24/2013    Medtronic Adapta L dual-chamber pacemaker, serial number [id number]   . LEFT HEART CATH AND CORONARY ANGIOGRAPHY N/A 06/07/2018   Procedure: LEFT HEART CATH AND CORONARY ANGIOGRAPHY;  Surgeon: [name], MD;  Location: [facility] CV LAB;  Service: Cardiovascular;  Laterality: N/A;  . LEFT HEART CATHETERIZATION WITH CORONARY ANGIOGRAM N/A 03/17/2013   Procedure: LEFT HEART CATHETERIZATION WITH CORONARY ANGIOGRAM;  Surgeon: Burnell Blanks, MD; LAD mild dz, D1 branch 100%, inferior branch 99%, CFX OK, RCA 50%, EF 65%    . LOOP RECORDER IMPLANT  2002  . PERMANENT PACEMAKER INSERTION N/A 01/24/2013   Procedure: PERMANENT PACEMAKER INSERTION;  Surgeon: [name], MD;  Location: [facility] CATH LAB;  Service: Cardiovascular;  Laterality: N/A;  . TIBIAL TUBERCLERPLASTY  ~ 2003    Prior to Admission medications   Medication Sig Start Date End Date Taking? Authorizing Provider  amoxicillin-clavulanate (AUGMENTIN) 875-125 MG tablet Take 1 tablet by mouth 2 (two) times daily for 10 days. 01/24/20 02/03/20  Lucrezia Starch, MD  citalopram (CELEXA) 40 MG tablet Take 1 tablet (40 mg total) by mouth daily. For depression. 11/20/19   [name], NP  clopidogrel (PLAVIX) 75 MG tablet TAKE 1 TABLET BY MOUTH EVERY DAY 09/08/19   [name], MD  diclofenac Sodium (VOLTAREN) 1 % GEL Apply 2 g topically 4 (four) times daily. 07/17/19   Khatri, Hina, PA-C  fish oil-omega-3 fatty acids 1000 MG capsule Take 1 g by mouth daily.      [provider]  fluticasone (FLONASE) 50 MCG/ACT nasal spray PLACE 1 SPRAY INTO BOTH NOSTRILS DAILY AS NEEDED FOR ALLERGIES OR RHINITIS. 01/22/20   [address], Modena Nunnery, MD  furosemide (LASIX) 20 MG tablet Take 1 tablet (20 mg total) by mouth daily. 08/25/19   [name], MD   gabapentin (NEURONTIN) 300 MG capsule Take 1 capsule (300 mg total) by mouth 3 (three) times daily. 08/28/19   [name], NP  glucose blood (ONE TOUCH ULTRA TEST) test strip USE TO TEST UP TO 4 TIMES DAILY 10/23/17   [name], NP  isosorbide mononitrate (IMDUR) 60 MG 24 hr tablet TAKE 1 TABLET BY MOUTH EVERY DAY 11/19/19   [name], MD  JARDIANCE 10 MG TABS tablet TAKE 1 TABLET BY MOUTH EVERY MORNING FOR DIABETES 10/28/19   [name], NP  lisinopril (ZESTRIL) 5 MG tablet TAKE 1 TABLET BY MOUTH EVERY DAY 11/18/19   [name], NP  metFORMIN (GLUCOPHAGE) 500 MG tablet TAKE 1 TABLET BY MOUTH TWICE DAILY WITH A MEAL 01/16/20   [name], NP  metoprolol succinate (TOPROL-XL) 25 MG 24 hr tablet TAKE 1 TABLET BY MOUTH EVERY DAY 07/08/19   [name], MD  nitroGLYCERIN (NITROSTAT) 0.4 MG SL tablet Place 1 tablet (0.4 mg total) under the tongue every 5 (five) minutes as needed for chest pain. 01/23/18   Alma Friendly  K, NP  pantoprazole (PROTONIX) 40 MG tablet TAKE 1 TABLET (40 MG TOTAL) BY MOUTH DAILY FOR HEARTBURN. 12/31/19   [name], NP  rosuvastatin (CRESTOR) 20 MG tablet TAKE 1 TABLET BY MOUTH EVERY DAY IN THE EVENING FOR CHOLESTEROL 10/23/19   [name], NP  sitaGLIPtin (JANUVIA) 100 MG tablet TAKE 1 TABLET BY MOUTH ONCE DAILY FOR DIABETES. 11/04/19   [name], NP  tamsulosin (FLOMAX) 0.4 MG CAPS capsule TAKE 1 CAPSULE (0.4 MG TOTAL) BY MOUTH 2 (TWO) TIMES DAILY. 01/19/20   Noreene Filbert, MD  warfarin (COUMADIN) 5 MG tablet TAKE 1/2 TABLET EVERY DAY OTHER THAN MONDAYS. TAKE 1 FULL TABLET BY MOUTH ON MONDAYS 12/23/19   [name], NP    Allergies Patient has no known allergies.  Family History  Problem Relation Age of Onset  . Alzheimer's disease Mother   . Osteoporosis Mother     Social History Social History   Tobacco Use  . Smoking status: Former Smoker    Packs/day: 1.00    Years: 42.00    Pack years:  42.00    Types: Cigarettes    Quit date: 12/31/2011    Years since quitting: 8.0  . Smokeless tobacco: Never Used  Vaping Use  . Vaping Use: Never used  Substance Use Topics  . Alcohol use: Not Currently    Comment: 03/14/2013 "stopped drinking back in 2002; never had problem w/it"  . Drug use: No    Review of Systems  Review of Systems  Constitutional: Negative for chills and fever.  HENT: Negative for sore throat.   Eyes: Negative for pain.  Respiratory: Positive for cough and shortness of breath. Negative for stridor.   Cardiovascular: Negative for chest pain.  Gastrointestinal: Positive for abdominal pain. Negative for vomiting.  Genitourinary: Negative for dysuria.  Musculoskeletal: Negative for myalgias.  Skin: Negative for rash.  Neurological: Negative for seizures, loss of consciousness and headaches.  Psychiatric/Behavioral: Negative for suicidal ideas.  All other systems reviewed and are negative.     ____________________________________________   PHYSICAL EXAM:  VITAL SIGNS: ED Triage Vitals [01/23/20 2134]  Enc Vitals Group     BP (!) 144/71     Pulse Rate 83     Resp 17     Temp 98.4 F (36.9 C)     Temp Source Oral     SpO2 97 %     Weight      Height      Head Circumference      Peak Flow      Pain Score      Pain Loc      Pain Edu?      Excl. in [place]?    Vitals:   01/23/20 2339 01/24/20 0226  BP: 133/84 127/74  Pulse: 70 70  Resp: 18 18  Temp: 98.4 F (36.9 C) 98 F (36.7 C)  SpO2: 96% 96%   Physical Exam Vitals and nursing note reviewed.  Constitutional:      Appearance: He is well-developed.  HENT:     Head: Normocephalic and atraumatic.     Right Ear: External ear normal.     Left Ear: External ear normal.     Nose: Nose normal.  Eyes:     Conjunctiva/sclera: Conjunctivae normal.  Cardiovascular:     Rate and Rhythm: Normal rate and regular rhythm.     Heart sounds: No murmur heard.   Pulmonary:     Effort: Pulmonary  effort  is normal. No respiratory distress.     Breath sounds: Normal breath sounds. No stridor. No wheezing, rhonchi or rales.  Chest:     Chest wall: No tenderness.  Abdominal:     Palpations: Abdomen is soft.     Tenderness: There is abdominal tenderness in the left lower quadrant. There is no right CVA tenderness or left CVA tenderness.  Musculoskeletal:     Cervical back: Neck supple.  Skin:    General: Skin is warm and dry.     Capillary Refill: Capillary refill takes less than 2 seconds.  Neurological:     Mental Status: He is alert and oriented to person, place, and time.  Psychiatric:        Mood and Affect: Mood normal.     Patient's bilateral inguinal regions are unremarkable for hernia. ____________________________________________   LABS (all labs ordered are listed, but only abnormal results are displayed)  Labs Reviewed  COMPREHENSIVE METABOLIC PANEL - Abnormal; Notable for the following components:      Result Value   Glucose, Bld 206 (*)    All other components within normal limits  CBC - Abnormal; Notable for the following components:   Platelets 128 (*)    All other components within normal limits  URINALYSIS, COMPLETE (UACMP) WITH MICROSCOPIC - Abnormal; Notable for the following components:   Color, Urine YELLOW (*)    APPearance CLEAR (*)    Glucose, UA >=500 (*)    All other components within normal limits  RESPIRATORY PANEL BY RT PCR (FLU A&B, COVID)  LIPASE, BLOOD  BRAIN NATRIURETIC PEPTIDE  PROCALCITONIN   ____________________________________________  EKG  Sinus rhythm with a ventricular rate of 73, prolonged QTC interval at 526 as well as prolonged QRS at 86 and multiple nonspecific ST T wave changes that are all present on prior EKGs. ____________________________________________  RADIOLOGY  ED MD interpretation: Chest x-ray shows AICD in place without clear evidence of pneumonia, pneumothorax, edema, effusion, or other acute intrathoracic  process.  Official radiology report(s): DG Chest 2 View  Result Date: 01/24/2020 CLINICAL DATA:  Dyspnea EXAM: CHEST - 2 VIEW COMPARISON:  06/04/2018 FINDINGS: Lungs are well expanded, are symmetric, and are clear. No pneumothorax or pleural effusion. Cardiac size is within normal limits. Left subclavian dual lead pacemaker is unchanged. Pulmonary vascularity is normal. No acute bone abnormality. IMPRESSION: No active cardiopulmonary disease. Electronically Signed   By: Fidela Salisbury MD   On: 01/24/2020 02:24   CT Renal Stone Study  Result Date: 01/23/2020 CLINICAL DATA:  Flank pain.  Right lower flank pain EXAM: CT ABDOMEN AND PELVIS WITHOUT CONTRAST TECHNIQUE: Multidetector CT imaging of the abdomen and pelvis was performed following the standard protocol without IV contrast. COMPARISON:  11/28/2017 FINDINGS: Lower chest: The lung bases are clear. The heart size is normal. Hepatobiliary: There is decreased hepatic attenuation suggestive of hepatic steatosis. Cholelithiasis without acute inflammation.There is no biliary ductal dilation. Pancreas: Normal contours without ductal dilatation. No peripancreatic fluid collection. Spleen: Unremarkable. Adrenals/Urinary Tract: --Adrenal glands: Unremarkable. --Right kidney/ureter: No hydronephrosis or radiopaque kidney stones. --Left kidney/ureter: No hydronephrosis or radiopaque kidney stones. --Urinary bladder: Unremarkable. Stomach/Bowel: --Stomach/Duodenum: No hiatal hernia or other gastric abnormality. Normal duodenal course and caliber. --Small bowel: Unremarkable. --Colon: Findings are consistent with sigmoid diverticulitis. There are multiple sigmoid diverticula with surrounding inflammatory changes and a small amount of free fluid. There is no abscess. No extraluminal free air. --Appendix: Normal. Vascular/Lymphatic: There are atherosclerotic changes of the abdominal aorta. There is a  fusiform infrarenal abdominal aortic aneurysm measuring 3.3 cm (axial  series 6, image 45). --No retroperitoneal lymphadenopathy. --No mesenteric lymphadenopathy. --No pelvic or inguinal lymphadenopathy. Reproductive: Unremarkable Other: No ascites or free air. The abdominal wall is normal. Musculoskeletal. No acute displaced fractures. IMPRESSION: 1. Uncomplicated sigmoid diverticulitis. 2. Hepatic steatosis. 3. Cholelithiasis without acute inflammation. 4. Fusiform infrarenal abdominal aortic aneurysm measuring up to 3.3 cm. Recommend follow-up every 3 years. This recommendation follows ACR consensus guidelines: White Paper of the ACR Incidental Findings Committee II on Vascular Findings. J Am Coll Radiol 2013; 10:789-794. Aortic Atherosclerosis (ICD10-I70.0). Electronically Signed   By: Constance Holster M.D.   On: 01/23/2020 22:14    ____________________________________________   PROCEDURES  Procedure(s) performed (including Critical Care):  Procedures   ____________________________________________   INITIAL IMPRESSION / ASSESSMENT AND PLAN / ED COURSE        Patient presents with [place] to history exam for assessment of some left lower quadrant abdominal pain.  In addition patient notes for the past week he has had some cough and shortness of breath.  Patient is afebrile hemodynamically stable arrival.  Exam as above remarkable for some tenderness in the left lower quadrant.  Regarding patient's cough and shortness of breath differential includes but is not limited to PE, ACS, CHF, pneumonia, COPD exacerbation, Covid, anemia, and GERD.  No significant wheezing or tachypnea or accessory muscle use to suggest COPD exacerbation.  Covid is negative.  Patient is not appear anemic.  Chest x-ray shows no evidence of significant volume overload and BNP is not elevated and I will suspicion for CHF at this time.  Low suspicion for ACS given patient denies any chest pain has a reassuring EKG very similar to multiple priors..  Low suspicion for PE as patient states he is  compliant with his Coumadin.  In addition is no evidence of hypoxic, tachypnea, tachycardia, or respiratory distress.  It is possible patient has viral bronchitis although low suspicion for bacterial bronchitis at this time.  Regarding patient's left lower quadrant abdominal pain differential includes but is not limited to nephrolithiasis, diverticulitis, SBO, muscle strain, cellulitis, and testicular torsion.  CT obtained does show evidence of acute uncomplicated diverticulitis.  No history or exam findings to suggest or hernia.  Patient is noted to have cholelithiasis without evidence of acute cholecystitis.  Work-up is not consistent with acute pancreatitis, cholecystitis, pyelonephritis, or other acute intra-abdominal pathology.  I advised patient of incidental findings including abdominal aortic aneurysm that will require outpatient follow-up.  Patient given below noted analgesia.  Rx written for antibiotics.  Patient discharged stable condition.  Strict return precautions advised and discussed.  Instructed to follow-up with PCP next 3 to 5 days.  ____________________________________________   FINAL CLINICAL IMPRESSION(S) / ED DIAGNOSES  Final diagnoses:  Diverticulitis  Hyperglycemia  Hepatic steatosis  Calculus of gallbladder without cholecystitis without obstruction  Abdominal aortic aneurysm (AAA) 3.0 cm to 5.5 cm in diameter in male [facility])  Atherosclerosis  Anticoagulated  Abnormal ECG    Medications  acetaminophen (TYLENOL) tablet 1,000 mg (1,000 mg Oral Given 01/24/20 0136)  naproxen (NAPROSYN) tablet 500 mg (500 mg Oral Given 01/24/20 0136)     ED Discharge Orders         Ordered    amoxicillin-clavulanate (AUGMENTIN) 875-125 MG tablet  2 times daily        01/24/20 0124           Note:  This document was prepared using Dragon voice recognition software and may  include unintentional dictation errors.   Lucrezia Starch, MD 01/24/20 [phone]

## 2020-01-24 NOTE — ED Notes (Signed)
Paper ED discharge consent signed

## 2020-01-24 NOTE — Discharge Instructions (Addendum)
Your abdominal CT showed: 1. Uncomplicated sigmoid diverticulitis. 2. Hepatic steatosis. 3. Cholelithiasis without acute inflammation. 4. Fusiform infrarenal abdominal aortic aneurysm measuring up to 3.3 cm. Recommend follow-up every 3 years. This recommendation follows ACR consensus guidelines: White Paper of the ACR Incidental Findings Committee II on Vascular Findings. J Am Coll Radiol 2013; 10:789-794. Aortic Atherosclerosis (ICD10-I70.0).

## 2020-01-27 ENCOUNTER — Other Ambulatory Visit: Payer: Self-pay | Admitting: Primary Care

## 2020-01-27 DIAGNOSIS — F339 Major depressive disorder, recurrent, unspecified: Secondary | ICD-10-CM

## 2020-02-02 NOTE — Progress Notes (Signed)
Cardiology Office Note Date:  02/02/2020  Patient ID:  [name] Cook, [name] Cook 1950-04-21, MRN [id number] PCP:  [name], NP  Cardiologist:  [name]    Chief Complaint:   6 mo  History of Present Illness: [name] Cook is a 69 y.o. male with history of AFlutter (ablated) >> persistent AFib >> permanent uncontrolled AFib >> AVNode ablation/PPM, HTN, HLD, DM, COPD, chronic combined CHF, CAD (NSTEMI 2014 w/occ branch of Diag treated medically >> 2016 unstable angina had DES to RCA).   He comes in today to be seen for [name].  Last seen by him [name] 2020, he was admitted to [facility] w/CP feb with stable CAD by cath.  I saw him 05/28/19:  he initially reported feeling well and had no complaints or symptoms of concern to him, though the longer we talked he mentiond a CP that he describes as a pinch, located to a couple different spots on his chest, inferior to L breast ad high sternum, no associated symptoms, no radiation, and random, not associated with exertion or position, duration is hard to nail down, maybe a couple minutes, just self resolves, he does not use s/l NTG for it.  Infrequent and thinks he has had it on/off for a year or so. He also described DOE.  He reported able to do work around the house, he gives an example of currently cleaning out his shed and lifting/carrying things, organizing without symptoms, but if he walks an incline he gets very winded has to stop to regain himself.  He will sometimes get winded walking from the car to the house, though this was much less and not typical for him, can do the same walk and more without symptoms as well. This symptoms had been about the same for a couple years He denied any Palpitations, no near syncope or syncope. He reports his PMD generally does labs Q 36mo for lipids and routine labs. They are managing hs warfarin He denies any bleeding or signs of bleeding  Symptoms felt atypical, though stable major epicardial CAD, known  significant branch vessel disease could not r/o ? Anginal equivalent.  His Imdur was increased, lisinopril reduced.  Recommended to start walking at pace that did not cause symptoms to start some cardiac conditioning.Marland Kitchen  He was not felt to be volume OL, planned for labs and f/u Labs were OK.  I saw him 07/24/19 in f/u He has tolerated the medicine changes, feels like he is doing well. No notable DOE that he noted.  No CP, palpitations.  No dizzy spells, near syncope or syncope. He has started walking some for exercise, continues to work on his shed, and denies any concerns. Unfortunately he sufferers with neuropathy particularly his R foot and can not walk to far or too long.  Burning pain to the bottom of his foot, no calf/muscular pain, nothing sounding of claudication He twisted his ankle a couple weeks ago and this has been slow to heal, reports xray done without fracture. No changes were made., planned for 39mo f/u.  He had an ER visit 01/24/2020 for LLQ/flank pain and some degree of SOB/cough.  Not found to be volume OL with low suspicion of PE given warfarin, or ACS, found with acute uncomplicated diverticulitis and start ed on Abx  TODAY He denies any CP or palpitations, no cardiac awareness, no near syncope or syncope. He will occassionally feel a fullness in his ears/head, no headaches. Generally has no energy, tired all  of the time. He has some occ SOB that is baseline and unchanged No bleeding or signs of bleeding Reports his PMD manages his INR/warfarin and lipids.   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 Arthritis    "knees and lower back" (03/14/2013)  . Atrial flutter ([place])    radiofrequency ablation in 2001  . CAD (coronary artery disease)    a.  Nonobstructive. Cardiac cath in 2001-50% mid RI, normal LM, LAD, RCA b. cath 10/16/2014 95% mid RCA treated with DES, 99% ost D1 medical management due to small aneurysmal segment  . Chronic anticoagulation    chronic Coumadin anticoagulation  . Chronic obstructive pulmonary disease ([place]) 04/20/2011  . Diabetes mellitus, type 2 ([place])   . GERD (gastroesophageal reflux disease)   . Hyperlipidemia   . Hypertension    with hypertensive heart disease  . Left knee pain 10/25/2017   medial  . Obesity   . Persistent atrial fibrillation ([facility])    recurrent atrial flutter since 2001 s/p DCCVs, multiple failed AADs, h/o tachy-mediated cardiomyopathy  . Shortness of breath    "can come on at any time" (03/14/2013)  . Sleep apnea    "dx'd; couldn't wear the mask" (03/14/2013)  . Tobacco abuse     Past Surgical History:  Procedure Laterality Date  . ATRIAL FLUTTER ABLATION  2002   atrial flutter; subsequently developed atrial fibrillation  . AV NODE ABLATION  01/24/2013  . CARDIAC CATHETERIZATION  2002  . CARDIAC CATHETERIZATION N/A 10/16/2014   Procedure: Left Heart Cath and Coronary Angiography;  Surgeon: [name], MD;  Location: [place] [facility];  Service: Cardiovascular;  Laterality: N/A;  . CARDIOVERSION  05/31/2011   Procedure: CARDIOVERSION;  Surgeon: Cristopher Estimable. Lattie Haw, MD;  Location: [facility];  Service: Cardiovascular;  Laterality: N/A;  . CARPAL TUNNEL RELEASE Left 1980's  . COLONOSCOPY WITH PROPOFOL N/A 12/30/2018   Procedure: COLONOSCOPY WITH PROPOFOL;  Surgeon: Jonathon Bellows, MD;  Location: [facility];  Service: Gastroenterology;  Laterality: N/A;  . COLONOSCOPY WITH PROPOFOL N/A 12/04/2019   Procedure: COLONOSCOPY WITH PROPOFOL;  Surgeon: Jonathon Bellows, MD;  Location: [facility] ENDOSCOPY;  Service: Gastroenterology;  Laterality: N/A;  . INSERT / REPLACE / REMOVE PACEMAKER  01/24/2013    Medtronic Adapta L dual-chamber pacemaker, serial number [id number]   . LEFT HEART CATH AND CORONARY  ANGIOGRAPHY N/A 06/07/2018   Procedure: LEFT HEART CATH AND CORONARY ANGIOGRAPHY;  Surgeon: [name], MD;  Location: [place] [facility];  Service: Cardiovascular;  Laterality: N/A;  . LEFT HEART CATHETERIZATION WITH CORONARY ANGIOGRAM N/A 03/17/2013   Procedure: LEFT HEART CATHETERIZATION WITH CORONARY ANGIOGRAM;  Surgeon: Burnell Blanks, MD; LAD mild dz, D1 branch 100%, inferior branch 99%, CFX OK, RCA 50%, EF 65%    . LOOP RECORDER IMPLANT  2002  . PERMANENT PACEMAKER INSERTION N/A 01/24/2013   Procedure: PERMANENT PACEMAKER INSERTION;  Surgeon: [name], MD;  Location: [facility] CATH LAB;  Service: Cardiovascular;  Laterality: N/A;  . TIBIAL TUBERCLERPLASTY  ~ 2003    Current Outpatient Medications  Medication Sig Dispense Refill  . amoxicillin-clavulanate (AUGMENTIN) 875-125 MG tablet Take 1 tablet by mouth 2 (two) times daily for 10 days. 20 tablet 0  . citalopram (CELEXA) 40 MG tablet Take 1 tablet (  40 mg total) by mouth daily. For depression. 90 tablet 0  . clopidogrel (PLAVIX) 75 MG tablet TAKE 1 TABLET BY MOUTH EVERY DAY 90 tablet 3  . diclofenac Sodium (VOLTAREN) 1 % GEL Apply 2 g topically 4 (four) times daily. 50 g 0  . fish oil-omega-3 fatty acids 1000 MG capsule Take 1 g by mouth daily.      . fluticasone (FLONASE) 50 MCG/ACT nasal spray PLACE 1 SPRAY INTO BOTH NOSTRILS DAILY AS NEEDED FOR ALLERGIES OR RHINITIS. 48 mL 3  . furosemide (LASIX) 20 MG tablet Take 1 tablet (20 mg total) by mouth daily. 90 tablet 3  . gabapentin (NEURONTIN) 300 MG capsule Take 1 capsule (300 mg total) by mouth 3 (three) times daily. 270 capsule 0  . glucose blood (ONE TOUCH ULTRA TEST) test strip USE TO TEST UP TO 4 TIMES DAILY 100 each 2  . isosorbide mononitrate (IMDUR) 60 MG 24 hr tablet TAKE 1 TABLET BY MOUTH EVERY DAY 90 tablet 2  . JARDIANCE 10 MG TABS tablet TAKE 1 TABLET BY MOUTH EVERY MORNING FOR DIABETES 90 tablet 1  . lisinopril (ZESTRIL) 5 MG tablet TAKE 1 TABLET BY MOUTH EVERY  DAY 90 tablet 0  . metFORMIN (GLUCOPHAGE) 500 MG tablet TAKE 1 TABLET BY MOUTH TWICE DAILY WITH A MEAL 180 tablet 1  . metoprolol succinate (TOPROL-XL) 25 MG 24 hr tablet TAKE 1 TABLET BY MOUTH EVERY DAY 90 tablet 3  . nitroGLYCERIN (NITROSTAT) 0.4 MG SL tablet Place 1 tablet (0.4 mg total) under the tongue every 5 (five) minutes as needed for chest pain. 25 tablet 0  . pantoprazole (PROTONIX) 40 MG tablet TAKE 1 TABLET (40 MG TOTAL) BY MOUTH DAILY FOR HEARTBURN. 90 tablet 1  . rosuvastatin (CRESTOR) 20 MG tablet TAKE 1 TABLET BY MOUTH EVERY DAY IN THE EVENING FOR CHOLESTEROL 90 tablet 1  . sitaGLIPtin (JANUVIA) 100 MG tablet TAKE 1 TABLET BY MOUTH ONCE DAILY FOR DIABETES. 90 tablet 1  . tamsulosin (FLOMAX) 0.4 MG CAPS capsule TAKE 1 CAPSULE (0.4 MG TOTAL) BY MOUTH 2 (TWO) TIMES DAILY. 180 capsule 3  . warfarin (COUMADIN) 5 MG tablet TAKE 1/2 TABLET EVERY DAY OTHER THAN MONDAYS. TAKE 1 FULL TABLET BY MOUTH ON MONDAYS 48 tablet 1   No current facility-administered medications for this visit.    Allergies:   Patient has no known allergies.   Social History:  The patient  reports that he quit smoking about 8 years ago. His smoking use included cigarettes. He has a 42.00 pack-year smoking history. He has never used smokeless tobacco. He reports previous alcohol use. He reports that he does not use drugs.   Family History:  The patient's family history includes Alzheimer's disease in his mother; Osteoporosis in his mother.  ROS:  Please see the history of present illness.  All other systems are reviewed and otherwise negative.   PHYSICAL EXAM:  VS:  There were no vitals taken for this visit. BMI: There is no height or weight on file to calculate BMI. Well nourished, well developed, in no acute distress  HEENT: normocephalic, atraumatic  Neck: no JVD, carotid bruits or masses Cardiac:  RRR; (paced) no significant murmurs, no rubs, or gallops Lungs:  CTA b/l, no wheezing, rhonchi or rales  Abd:  soft, nontender, obese MS: no deformity or atrophy Ext: no edema or skin changes noted. Skin: warm and dry, no rash Neuro:  No gross deficits appreciated Psych: euthymic mood, full affect  PPM site  is stable, no tethering or discomfort   EKG:  Not done today  01/24/2020 personally reviewed: AF, V paced   PPM interrogation done today and reviewed by myself:  Battery and lead testing are good, no HVR  Cardiac catheterization 06/07/2018 LM irregularities LAD mild diffuse disease; D1 ostial 99, superior branch occluded/larger inferior branch 99 -  unchanged from 2014 and 2016 LCx mild diffuse disease RCA proximal 40, mid stent patent, 30   Echocardiogram 06/06/2018 EF 45-50, mild LVH, indeterminate diastolic function, anterior septal and inferior akinesis, normal RVSF, moderate LAE, mild aortic valve calcification  Echocardiogram 10/16/2014 Moderate LVH, anterior and periapical hypokinesis, EF 35-40, trivial MR, mild LAE, mild RAE    Recent Labs: 04/15/2019: TSH 2.030 01/23/2020: ALT 19; B Natriuretic Peptide 70.1; BUN 17; Creatinine, Ser 1.12; Hemoglobin 15.6; Platelets 128; Potassium 3.7; Sodium 138  10/07/2019: Cholesterol 145; HDL 45.80; LDL Cholesterol 68; Total CHOL/HDL Ratio 3; Triglycerides 156.0; VLDL 31.2   CrCl cannot be calculated (Unknown ideal weight.).   Wt Readings from Last 3 Encounters:  01/15/20 234 lb 14.4 oz (106.5 kg)  12/15/19 232 lb 8 oz (105.5 kg)  12/04/19 230 lb (104.3 kg)     Other studies reviewed: Additional studies/records reviewed today include: summarized above  ASSESSMENT AND PLAN:  1. PPM     Intact function, no changes made  2. Permanent Afib     S/p AV node ablation     CHA2DS2Vasc is, 5 on warfarin, monitored and managed by his PMD  3. CAD     CP not an ongoing complaint, sounded atypical, he has known branch vessel disease and patent stent by cath last year      On plavix, nitrate, BB, statin  4. Chronic CHF (combined)      LVEF by evaluation last year 45-50%     Exam does not suggest volume OL     His DOE is unchanged for a couple years, known COPD     On BB/ACE, furosemide    5. HTN     Relative hypotension today  I discussed with his girlfriend over the phone who keeps track of his medicines, she mentions at home his BP is often in the 90's and he is tired all of the time  He takes all of his cardiac/BP meds in the morning We will start be reducing his lisinopril to 2.5mg  daily and they will monitor his BPat home.  I instructed him not to take his AM BP medicines if his BP is <100 prior to his medicines, to notify us if it is  He tells me he drinks about a gallon a day of diet kool aide, no plain water   BPs here  Right 88/56 >> 92/60 Left 90/56 >> 90/56  6. HLD     Monitored and managed with his PMD   Disposition: as above, I will see him back in 1 mo, sooner if needed.      Current medicines are reviewed at length with the patient today.  The patient did not have any concerns regarding medicines.  Venetia Night, PA-C 02/02/2020 8:52 AM     [facility] [address] [place] [address] [phone] (office)  [phone] (fax)

## 2020-02-03 ENCOUNTER — Other Ambulatory Visit: Payer: Self-pay

## 2020-02-03 ENCOUNTER — Ambulatory Visit (INDEPENDENT_AMBULATORY_CARE_PROVIDER_SITE_OTHER): Payer: Medicare Other | Admitting: Physician Assistant

## 2020-02-03 VITALS — BP 102/70 | HR 79 | Ht 71.0 in | Wt 231.0 lb

## 2020-02-03 DIAGNOSIS — I255 Ischemic cardiomyopathy: Secondary | ICD-10-CM

## 2020-02-03 DIAGNOSIS — Z950 Presence of cardiac pacemaker: Secondary | ICD-10-CM

## 2020-02-03 DIAGNOSIS — I952 Hypotension due to drugs: Secondary | ICD-10-CM | POA: Diagnosis not present

## 2020-02-03 DIAGNOSIS — I2510 Atherosclerotic heart disease of native coronary artery without angina pectoris: Secondary | ICD-10-CM

## 2020-02-03 DIAGNOSIS — I4821 Permanent atrial fibrillation: Secondary | ICD-10-CM | POA: Diagnosis not present

## 2020-02-03 DIAGNOSIS — I5022 Chronic systolic (congestive) heart failure: Secondary | ICD-10-CM

## 2020-02-03 MED ORDER — LISINOPRIL 2.5 MG PO TABS
2.5000 mg | ORAL_TABLET | Freq: Every day | ORAL | 1 refills | Status: DC
Start: 2020-02-03 — End: 2020-07-14

## 2020-02-03 NOTE — Patient Instructions (Signed)
Medication Instructions:    START TAKING LISINOPRIL 2.5 MG ONCE A DAY   *If you need a refill on your cardiac medications before your next appointment, please call your pharmacy*   Lab Work: NONE ORDERED  TODAY   If you have labs (blood work) drawn today and your tests are completely normal, you will receive your results only by: Marland Kitchen MyChart Message (if you have MyChart) OR . A paper copy in the mail If you have any lab test that is abnormal or we need to change your treatment, we will call you to review the results.   Testing/Procedures: NONE ORDERED  TODAY   Follow-Up: At [facility], you and your health needs are our priority.  As part of our continuing mission to provide you with exceptional heart care, we have created designated Provider Care Teams.  These Care Teams include your primary Cardiologist (physician) and Advanced Practice Providers (APPs -  Physician Assistants and Nurse Practitioners) who all work together to provide you with the care you need, when you need it.  We recommend signing up for the patient portal called "MyChart".  Sign up information is provided on this After Visit Summary.  MyChart is used to connect with patients for Virtual Visits (Telemedicine).  Patients are able to view lab/test results, encounter notes, upcoming appointments, etc.  Non-urgent messages can be sent to your provider as well.   To learn more about what you can do with MyChart, go to [email or web address].    Your next appointment:   1 month(s)  The format for your next appointment:   In Person  Provider:   You may see Tommye Standard  or one of the following Advanced Practice Providers on your designated Care Team:    Chanetta Marshall, NP  Tommye Standard, PA-C  Legrand Como "Jonni Sanger" Chalmers Cater, [place]    Other Instructions

## 2020-02-05 ENCOUNTER — Telehealth: Payer: Self-pay | Admitting: Primary Care

## 2020-02-05 ENCOUNTER — Telehealth: Payer: Self-pay

## 2020-02-05 ENCOUNTER — Ambulatory Visit: Payer: Medicare Other

## 2020-02-05 NOTE — Telephone Encounter (Signed)
Error

## 2020-02-05 NOTE — Telephone Encounter (Signed)
Pt had apt today for coumadin clinic and called to see if he could RS to tomorrow since he has OV with PCP tomorrow. Apt has been RS for tomorrow.

## 2020-02-06 ENCOUNTER — Other Ambulatory Visit: Payer: Self-pay

## 2020-02-06 ENCOUNTER — Encounter: Payer: Self-pay | Admitting: Primary Care

## 2020-02-06 ENCOUNTER — Ambulatory Visit (INDEPENDENT_AMBULATORY_CARE_PROVIDER_SITE_OTHER): Payer: Medicare Other

## 2020-02-06 ENCOUNTER — Ambulatory Visit (INDEPENDENT_AMBULATORY_CARE_PROVIDER_SITE_OTHER): Payer: Medicare Other | Admitting: Primary Care

## 2020-02-06 DIAGNOSIS — Z8719 Personal history of other diseases of the digestive system: Secondary | ICD-10-CM | POA: Insufficient documentation

## 2020-02-06 DIAGNOSIS — Z7901 Long term (current) use of anticoagulants: Secondary | ICD-10-CM

## 2020-02-06 DIAGNOSIS — K5792 Diverticulitis of intestine, part unspecified, without perforation or abscess without bleeding: Secondary | ICD-10-CM | POA: Diagnosis not present

## 2020-02-06 LAB — POCT INR: INR: 1.9 — AB (ref 2.0–3.0)

## 2020-02-06 NOTE — Patient Instructions (Addendum)
Pre visit review using our clinic review tool, if applicable. No additional management support is needed unless otherwise documented below in the visit note.  Increase dose today to 5mg  then continue taking 2.5mg  daily except take 5mg  on Monday. . Recheck in 4 wks.

## 2020-02-06 NOTE — Assessment & Plan Note (Signed)
Acute episode, diagnosed several weeks ago at [facility] ED. No prior episode as far as he's aware.  Augmentin completed. He appears very well, negative exam today.  Discussed to start a clear liquid diet and schedule an appointment if pain returns. Handout provided regarding diverticulitis.   Hospital labs, imaging, notes reviewed.

## 2020-02-06 NOTE — Patient Instructions (Signed)
If you develop the abdominal pain again, then go on a clear liquid diet and schedule a visit with me.  Schedule a lab appointment for late March 2022 for diabetes check.  It was a pleasure to see you today!   Diverticulitis  Diverticulitis is when small pockets in your large intestine (colon) get infected or swollen. This causes stomach pain and watery poop (diarrhea). These pouches are called diverticula. They form in people who have a condition called diverticulosis. Follow these instructions at home: Medicines  Take over-the-counter and prescription medicines only as told by your doctor. These include: ? Antibiotics. ? Pain medicines. ? Fiber pills. ? Probiotics. ? Stool softeners.  Do not drive or use heavy machinery while taking prescription pain medicine.  If you were prescribed an antibiotic, take it as told. Do not stop taking it even if you feel better. General instructions   Follow a diet as told by your doctor.  When you feel better, your doctor may tell you to change your diet. You may need to eat a lot of fiber. Fiber makes it easier to poop (have bowel movements). Healthy foods with fiber include: ? Berries. ? Beans. ? Lentils. ? Green vegetables.  Exercise 3 or more times a week. Aim for 30 minutes each time. Exercise enough to sweat and make your heart beat faster.  Keep all follow-up visits as told. This is important. You may need to have an exam of the large intestine. This is called a colonoscopy. Contact a doctor if:  Your pain does not get better.  You have a hard time eating or drinking.  You are not pooping like normal. Get help right away if:  Your pain gets worse.  Your problems do not get better.  Your problems get worse very fast.  You have a fever.  You throw up (vomit) more than one time.  You have poop that is: ? Bloody. ? Black. ? Tarry. Summary  Diverticulitis is when small pockets in your large intestine (colon) get  infected or swollen.  Take medicines only as told by your doctor.  Follow a diet as told by your doctor. This information is not intended to replace advice given to you by your health care provider. Make sure you discuss any questions you have with your health care provider. Document Revised: 03/16/2017 Document Reviewed: 04/20/2016 Elsevier Patient Education  [place].

## 2020-02-06 NOTE — Progress Notes (Signed)
Subjective:    Patient ID: [name] Cook, male    DOB: 1951-03-13, 69 y.o.   MRN: [id number]  HPI  This visit occurred during the SARS-CoV-2 public health emergency.  Safety protocols were in place, including screening questions prior to the visit, additional usage of staff PPE, and extensive cleaning of exam room while observing appropriate contact time as indicated for disinfecting solutions.   [name] Cook is a 69 year old male with a history of atrial fibrillation, hypertension, acute MI, CAD, COPD, OSA, type 2 diabetes, CKD, prostate cancer who presents today for emergency department follow up.  He presented to [facility] ED on 01/24/20 with a chief complaint of LLQ abdominal pain with radiation to his back and pelvis that began 2 days prior. Also with slight cough and mild dyspnea.  During his stay he tested negative for Covid-19. Chest xray without significant volume overload or pneumonia. Labs including BNP were negative. CBC was negative for leukocytosis.  His CT renal stone study showed acute non complicated diverticulitis to the sigmoid region. He was discharged home later that day with a prescription for Augmentin.  Since his ED visit he completed his Augmentin antibiotics this morning. He's feeling much better. He is have daily bowel movements, no bloody stools, nausea, abdominal pain. He was unaware that he had diverticulosis, and this was his first flare of diverticulitis as far as he knows.  Review of Systems  Constitutional: Negative for fever.  Gastrointestinal: Negative for abdominal pain, blood in stool, diarrhea and nausea.  Genitourinary: Negative for difficulty urinating and frequency.       Past Medical History:  Diagnosis Date  . Arthritis    "knees and lower back" (03/14/2013)  . Atrial flutter ([place])    radiofrequency ablation in 2001  . CAD (coronary artery disease)    a. Nonobstructive. Cardiac cath in 2001-50% mid RI, normal LM, LAD, RCA b. cath 10/16/2014 95% mid RCA  treated with DES, 99% ost D1 medical management due to small aneurysmal segment  . Chronic anticoagulation    chronic Coumadin anticoagulation  . Chronic obstructive pulmonary disease ([place]) 04/20/2011  . Diabetes mellitus, type 2 ([place])   . GERD (gastroesophageal reflux disease)   . Hyperlipidemia   . Hypertension    with hypertensive heart disease  . Left knee pain 10/25/2017   medial  . Obesity   . Persistent atrial fibrillation (HCC)    recurrent atrial flutter since 2001 s/p DCCVs, multiple failed AADs, h/o tachy-mediated cardiomyopathy  . Shortness of breath    "can come on at any time" (03/14/2013)  . Sleep apnea    "dx'd; couldn't wear the mask" (03/14/2013)  . Tobacco abuse      Social History   Socioeconomic History  . Marital status: Widowed    Spouse name: Not on file  . Number of children: 1  . Years of education: Not on file  . Highest education level: Not on file  Occupational History  . Occupation: Merchandiser, retail: UNEMPLOYED  Tobacco Use  . Smoking status: Former Smoker    Packs/day: 1.00    Years: 42.00    Pack years: 42.00    Types: Cigarettes    Quit date: 12/31/2011    Years since quitting: 8.1  . Smokeless tobacco: Never Used  Vaping Use  . Vaping Use: Never used  Substance and Sexual Activity  . Alcohol use: Not Currently    Comment: 03/14/2013 "stopped drinking back in 2002; never had problem  w/it"  . Drug use: No  . Sexual activity: Not Currently  Other Topics Concern  . Not on file  Social History Narrative   Single.   Retired.    1 son, deceased.    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   Enjoys playing on the computer.       Social Determinants of Health   Financial Resource Strain: Low Risk   . Difficulty of Paying Living Expenses: Not hard at all  Food Insecurity: No Food Insecurity  . Worried About Charity fundraiser in the Last Year: Never true  . Ran Out of Food in the Last  Year: Never true  Transportation Needs: No Transportation Needs  . Lack of Transportation (Medical): No  . Lack of Transportation (Non-Medical): No  Physical Activity: Inactive  . Days of Exercise per Week: 0 days  . Minutes of Exercise per Session: 0 min  Stress: No Stress Concern Present  . Feeling of Stress : Not at all  Social Connections:   . Frequency of Communication with Friends and Family: Not on file  . Frequency of Social Gatherings with Friends and Family: Not on file  . Attends Religious Services: Not on file  . Active Member of Clubs or Organizations: Not on file  . Attends Archivist Meetings: Not on file  . Marital Status: Not on file  Intimate Partner Violence: Not At Risk  . Fear of Current or Ex-Partner: No  . Emotionally Abused: No  . Physically Abused: No  . Sexually Abused: No    Past Surgical History:  Procedure Laterality Date  . ATRIAL FLUTTER ABLATION  2002   atrial flutter; subsequently developed atrial fibrillation  . AV NODE ABLATION  01/24/2013  . CARDIAC CATHETERIZATION  2002  . CARDIAC CATHETERIZATION N/A 10/16/2014   Procedure: Left Heart Cath and Coronary Angiography;  Surgeon: [name], MD;  Location: [place] [facility];  Service: Cardiovascular;  Laterality: N/A;  . CARDIOVERSION  05/31/2011   Procedure: CARDIOVERSION;  Surgeon: Cristopher Estimable. Lattie Haw, MD;  Location: [facility];  Service: Cardiovascular;  Laterality: N/A;  . CARPAL TUNNEL RELEASE Left 1980's  . COLONOSCOPY WITH PROPOFOL N/A 12/30/2018   Procedure: COLONOSCOPY WITH PROPOFOL;  Surgeon: Jonathon Bellows, MD;  Location: [facility] ENDOSCOPY;  Service: Gastroenterology;  Laterality: N/A;  . COLONOSCOPY WITH PROPOFOL N/A 12/04/2019   Procedure: COLONOSCOPY WITH PROPOFOL;  Surgeon: Jonathon Bellows, MD;  Location: [facility] ENDOSCOPY;  Service: Gastroenterology;  Laterality: N/A;  . INSERT / REPLACE / REMOVE PACEMAKER  01/24/2013    Medtronic Adapta L dual-chamber pacemaker, serial number [id number]    . LEFT HEART CATH AND CORONARY ANGIOGRAPHY N/A 06/07/2018   Procedure: LEFT HEART CATH AND CORONARY ANGIOGRAPHY;  Surgeon: [name], MD;  Location: [place] [facility];  Service: Cardiovascular;  Laterality: N/A;  . LEFT HEART CATHETERIZATION WITH CORONARY ANGIOGRAM N/A 03/17/2013   Procedure: LEFT HEART CATHETERIZATION WITH CORONARY ANGIOGRAM;  Surgeon: Burnell Blanks, MD; LAD mild dz, D1 branch 100%, inferior branch 99%, CFX OK, RCA 50%, EF 65%    . LOOP RECORDER IMPLANT  2002  . PERMANENT PACEMAKER INSERTION N/A 01/24/2013   Procedure: PERMANENT PACEMAKER INSERTION;  Surgeon: [name], MD;  Location: [facility] CATH LAB;  Service: Cardiovascular;  Laterality: N/A;  . TIBIAL TUBERCLERPLASTY  ~ 2003    Family History  Problem Relation Age of Onset  . Alzheimer's disease Mother   . Osteoporosis Mother  Not on File  Current Outpatient Medications on File Prior to Visit  Medication Sig Dispense Refill  . citalopram (CELEXA) 40 MG tablet Take 1 tablet (40 mg total) by mouth daily. For depression. 90 tablet 0  . clopidogrel (PLAVIX) 75 MG tablet TAKE 1 TABLET BY MOUTH EVERY DAY 90 tablet 3  . diclofenac Sodium (VOLTAREN) 1 % GEL Apply 2 g topically 4 (four) times daily. 50 g 0  . fish oil-omega-3 fatty acids 1000 MG capsule Take 1 g by mouth daily.      . fluticasone (FLONASE) 50 MCG/ACT nasal spray PLACE 1 SPRAY INTO BOTH NOSTRILS DAILY AS NEEDED FOR ALLERGIES OR RHINITIS. 48 mL 3  . furosemide (LASIX) 20 MG tablet Take 1 tablet (20 mg total) by mouth daily. 90 tablet 3  . gabapentin (NEURONTIN) 300 MG capsule Take 1 capsule (300 mg total) by mouth 3 (three) times daily. 270 capsule 0  . glucose blood (ONE TOUCH ULTRA TEST) test strip USE TO TEST UP TO 4 TIMES DAILY 100 each 2  . isosorbide mononitrate (IMDUR) 60 MG 24 hr tablet TAKE 1 TABLET BY MOUTH EVERY DAY 90 tablet 2  . JARDIANCE 10 MG TABS tablet TAKE 1 TABLET BY MOUTH EVERY MORNING FOR DIABETES 90 tablet 1  .  lisinopril (ZESTRIL) 2.5 MG tablet Take 1 tablet (2.5 mg total) by mouth daily. 90 tablet 1  . metFORMIN (GLUCOPHAGE) 500 MG tablet TAKE 1 TABLET BY MOUTH TWICE DAILY WITH A MEAL 180 tablet 1  . metoprolol succinate (TOPROL-XL) 25 MG 24 hr tablet TAKE 1 TABLET BY MOUTH EVERY DAY 90 tablet 3  . nitroGLYCERIN (NITROSTAT) 0.4 MG SL tablet Place 1 tablet (0.4 mg total) under the tongue every 5 (five) minutes as needed for chest pain. 25 tablet 0  . pantoprazole (PROTONIX) 40 MG tablet TAKE 1 TABLET (40 MG TOTAL) BY MOUTH DAILY FOR HEARTBURN. 90 tablet 1  . rosuvastatin (CRESTOR) 20 MG tablet TAKE 1 TABLET BY MOUTH EVERY DAY IN THE EVENING FOR CHOLESTEROL 90 tablet 1  . sitaGLIPtin (JANUVIA) 100 MG tablet TAKE 1 TABLET BY MOUTH ONCE DAILY FOR DIABETES. 90 tablet 1  . tamsulosin (FLOMAX) 0.4 MG CAPS capsule TAKE 1 CAPSULE (0.4 MG TOTAL) BY MOUTH 2 (TWO) TIMES DAILY. 180 capsule 3  . warfarin (COUMADIN) 5 MG tablet TAKE 1/2 TABLET EVERY DAY OTHER THAN MONDAYS. TAKE 1 FULL TABLET BY MOUTH ON MONDAYS 48 tablet 1   No current facility-administered medications on file prior to visit.    BP 104/68   Pulse 95   Temp 98 F (36.7 C) (Temporal)   Ht 5\' 11"  (1.803 m)   Wt 235 lb (106.6 kg)   SpO2 95%   BMI 32.78 kg/m    Objective:   Physical Exam Constitutional:      Appearance: He is not ill-appearing.  Pulmonary:     Effort: Pulmonary effort is normal.  Abdominal:     General: Bowel sounds are normal.     Palpations: Abdomen is soft.     Tenderness: There is no abdominal tenderness.  Neurological:     Mental Status: He is alert.            Assessment & Plan:

## 2020-02-10 ENCOUNTER — Other Ambulatory Visit: Payer: Self-pay | Admitting: Primary Care

## 2020-02-10 DIAGNOSIS — F339 Major depressive disorder, recurrent, unspecified: Secondary | ICD-10-CM

## 2020-02-11 ENCOUNTER — Other Ambulatory Visit: Payer: Self-pay | Admitting: Primary Care

## 2020-03-03 ENCOUNTER — Other Ambulatory Visit: Payer: Self-pay | Admitting: Family Medicine

## 2020-03-03 DIAGNOSIS — J069 Acute upper respiratory infection, unspecified: Secondary | ICD-10-CM

## 2020-03-04 ENCOUNTER — Ambulatory Visit (INDEPENDENT_AMBULATORY_CARE_PROVIDER_SITE_OTHER): Payer: Medicare Other

## 2020-03-04 DIAGNOSIS — I442 Atrioventricular block, complete: Secondary | ICD-10-CM

## 2020-03-05 ENCOUNTER — Encounter: Payer: Medicare Other | Admitting: Physician Assistant

## 2020-03-05 NOTE — Progress Notes (Deleted)
Cardiology Office Note Date:  03/05/2020  Patient ID:  [name] Cook, Upp[place] 1950/05/30, MRN [id number] PCP:  [name], NP  Cardiologist:  [name]    Chief Complaint:   ***  History of Present Illness: [name] Cook is a 69 y.o. male with history of AFlutter (ablated) >> persistent AFib >> permanent uncontrolled AFib >> AVNode ablation/PPM, HTN, HLD, DM, COPD, chronic combined CHF, CAD (NSTEMI 2014 w/occ branch of Diag treated medically >> 2016 unstable angina had DES to RCA).   He comes in today to be seen for [name].  Last seen by him [name] 2020, he was admitted to [facility] w/CP feb with stable CAD by cath.  I saw him 05/28/19:  he initially reported feeling well and had no complaints or symptoms of concern to him, though the longer we talked he mentiond a CP that he describes as a pinch, located to a couple different spots on his chest, inferior to L breast ad high sternum, no associated symptoms, no radiation, and random, not associated with exertion or position, duration is hard to nail down, maybe a couple minutes, just self resolves, he does not use s/l NTG for it.  Infrequent and thinks he has had it on/off for a year or so. He also described DOE.  He reported able to do work around the house, he gives an example of currently cleaning out his shed and lifting/carrying things, organizing without symptoms, but if he walks an incline he gets very winded has to stop to regain himself.  He will sometimes get winded walking from the car to the house, though this was much less and not typical for him, can do the same walk and more without symptoms as well. This symptoms had been about the same for a couple years He denied any Palpitations, no near syncope or syncope. He reports his PMD generally does labs Q 11mo for lipids and routine labs. They are managing hs warfarin He denies any bleeding or signs of bleeding  Symptoms felt atypical, though stable major epicardial CAD, known  significant branch vessel disease could not r/o ? Anginal equivalent.  His Imdur was increased, lisinopril reduced.  Recommended to start walking at pace that did not cause symptoms to start some cardiac conditioning.Marland Kitchen  He was not felt to be volume OL, planned for labs and f/u Labs were OK.  I saw him 07/24/19 in f/u He has tolerated the medicine changes, feels like he is doing well. No notable DOE that he noted.  No CP, palpitations.  No dizzy spells, near syncope or syncope. He has started walking some for exercise, continues to work on his shed, and denies any concerns. Unfortunately he sufferers with neuropathy particularly his R foot and can not walk to far or too long.  Burning pain to the bottom of his foot, no calf/muscular pain, nothing sounding of claudication He twisted his ankle a couple weeks ago and this has been slow to heal, reports xray done without fracture. No changes were made., planned for 14mo f/u.  He had an ER visit 01/24/2020 for LLQ/flank pain and some degree of SOB/cough.  Not found to be volume OL with low suspicion of PE given warfarin, or ACS, found with acute uncomplicated diverticulitis and start ed on Abx  I saw him 02/03/2020 He denies any CP or palpitations, no cardiac awareness, no near syncope or syncope. He will occassionally feel a fullness in his ears/head, no headaches. Generally has no energy,  tired all of the time. He has some occ SOB that is baseline and unchanged No bleeding or signs of bleeding Reports his PMD manages his INR/warfarin and lipids.  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 Arthritis    "knees and lower back" (03/14/2013)  . Atrial flutter ([place])    radiofrequency ablation in 2001  . CAD (coronary artery disease)    a. Nonobstructive. Cardiac cath in 2001-50% mid RI, normal LM, LAD, RCA b. cath 10/16/2014 95% mid RCA treated with DES, 99% ost D1 medical management due to small aneurysmal segment  . Chronic anticoagulation    chronic Coumadin anticoagulation  . Chronic obstructive pulmonary disease ([place]) 04/20/2011  . Diabetes mellitus, type 2 ([place])   . GERD (gastroesophageal reflux disease)   . Hyperlipidemia   . Hypertension    with hypertensive heart disease  . Left knee pain 10/25/2017   medial  . Obesity   . Persistent atrial fibrillation (HCC)    recurrent atrial flutter since 2001 s/p DCCVs, multiple failed AADs, h/o tachy-mediated cardiomyopathy  . Shortness of breath    "can come on at any time" (03/14/2013)  . Sleep apnea    "dx'd; couldn't wear the mask" (03/14/2013)  . Tobacco abuse     Past Surgical History:  Procedure Laterality Date  . ATRIAL FLUTTER ABLATION  2002   atrial flutter; subsequently developed atrial fibrillation  . AV NODE ABLATION  01/24/2013  . CARDIAC CATHETERIZATION  2002  . CARDIAC CATHETERIZATION N/A 10/16/2014   Procedure: Left Heart Cath and Coronary Angiography;  Surgeon: [name], MD;  Location: [place] [facility] LAB;  Service: Cardiovascular;  Laterality: N/A;  . CARDIOVERSION  05/31/2011   Procedure: CARDIOVERSION;  Surgeon: Cristopher Estimable. Lattie Haw, MD;  Location: [facility];  Service: Cardiovascular;  Laterality: N/A;  . CARPAL TUNNEL RELEASE Left 1980's  . COLONOSCOPY WITH PROPOFOL N/A 12/30/2018   Procedure: COLONOSCOPY WITH PROPOFOL;  Surgeon: Jonathon Bellows, MD;  Location: [facility] ENDOSCOPY;  Service: Gastroenterology;  Laterality: N/A;  . COLONOSCOPY WITH PROPOFOL N/A 12/04/2019   Procedure: COLONOSCOPY  WITH PROPOFOL;  Surgeon: Jonathon Bellows, MD;  Location: [facility] ENDOSCOPY;  Service: Gastroenterology;  Laterality: N/A;  . INSERT / REPLACE / REMOVE PACEMAKER  01/24/2013    Medtronic Adapta L dual-chamber pacemaker, serial number NWE [id number]   . LEFT HEART CATH AND CORONARY ANGIOGRAPHY N/A 06/07/2018   Procedure: LEFT HEART CATH AND CORONARY ANGIOGRAPHY;  Surgeon: [name], MD;  Location: [place] [facility];  Service: Cardiovascular;  Laterality: N/A;  . LEFT HEART CATHETERIZATION WITH CORONARY ANGIOGRAM N/A 03/17/2013   Procedure: LEFT HEART CATHETERIZATION WITH CORONARY ANGIOGRAM;  Surgeon: Burnell Blanks, MD; LAD mild dz, D1 branch 100%, inferior branch 99%, CFX OK, RCA 50%, EF 65%    . LOOP RECORDER IMPLANT  2002  . PERMANENT PACEMAKER INSERTION N/A 01/24/2013   Procedure: PERMANENT PACEMAKER INSERTION;  Surgeon: [name], MD;  Location: [facility] CATH LAB;  Service: Cardiovascular;  Laterality: N/A;  .  TIBIAL TUBERCLERPLASTY  ~ 2003    Current Outpatient Medications  Medication Sig Dispense Refill  . citalopram (CELEXA) 40 MG tablet TAKE 1 TABLET (40 MG TOTAL) BY MOUTH DAILY. FOR DEPRESSION. 90 tablet 0  . clopidogrel (PLAVIX) 75 MG tablet TAKE 1 TABLET BY MOUTH EVERY DAY 90 tablet 3  . diclofenac Sodium (VOLTAREN) 1 % GEL Apply 2 g topically 4 (four) times daily. 50 g 0  . fish oil-omega-3 fatty acids 1000 MG capsule Take 1 g by mouth daily.      . fluticasone (FLONASE) 50 MCG/ACT nasal spray PLACE 1 SPRAY INTO BOTH NOSTRILS DAILY AS NEEDED FOR ALLERGIES OR RHINITIS. 48 mL 3  . furosemide (LASIX) 20 MG tablet Take 1 tablet (20 mg total) by mouth daily. 90 tablet 3  . gabapentin (NEURONTIN) 300 MG capsule Take 1 capsule (300 mg total) by mouth 3 (three) times daily. 270 capsule 0  . glucose blood (ONE TOUCH ULTRA TEST) test strip USE TO TEST UP TO 4 TIMES DAILY 100 each 2  . isosorbide mononitrate (IMDUR) 60 MG 24 hr tablet TAKE 1 TABLET BY MOUTH EVERY DAY 90 tablet 2  .  JARDIANCE 10 MG TABS tablet TAKE 1 TABLET BY MOUTH EVERY MORNING FOR DIABETES 90 tablet 1  . lisinopril (ZESTRIL) 2.5 MG tablet Take 1 tablet (2.5 mg total) by mouth daily. 90 tablet 1  . metFORMIN (GLUCOPHAGE) 500 MG tablet TAKE 1 TABLET BY MOUTH TWICE DAILY WITH A MEAL 180 tablet 1  . metoprolol succinate (TOPROL-XL) 25 MG 24 hr tablet TAKE 1 TABLET BY MOUTH EVERY DAY 90 tablet 3  . nitroGLYCERIN (NITROSTAT) 0.4 MG SL tablet Place 1 tablet (0.4 mg total) under the tongue every 5 (five) minutes as needed for chest pain. 25 tablet 0  . pantoprazole (PROTONIX) 40 MG tablet TAKE 1 TABLET (40 MG TOTAL) BY MOUTH DAILY FOR HEARTBURN. 90 tablet 1  . rosuvastatin (CRESTOR) 20 MG tablet TAKE 1 TABLET BY MOUTH EVERY DAY IN THE EVENING FOR CHOLESTEROL 90 tablet 1  . sitaGLIPtin (JANUVIA) 100 MG tablet TAKE 1 TABLET BY MOUTH ONCE DAILY FOR DIABETES. 90 tablet 1  . tamsulosin (FLOMAX) 0.4 MG CAPS capsule TAKE 1 CAPSULE (0.4 MG TOTAL) BY MOUTH 2 (TWO) TIMES DAILY. 180 capsule 3  . warfarin (COUMADIN) 5 MG tablet TAKE 1/2 TABLET EVERY DAY OTHER THAN MONDAYS. TAKE 1 FULL TABLET BY MOUTH ON MONDAYS 48 tablet 1   No current facility-administered medications for this visit.    Allergies:   Patient has no allergy information on record.   Social History:  The patient  reports that he quit smoking about 8 years ago. His smoking use included cigarettes. He has a 42.00 pack-year smoking history. He has never used smokeless tobacco. He reports previous alcohol use. He reports that he does not use drugs.   Family History:  The patient's family history includes Alzheimer's disease in his mother; Osteoporosis in his mother.  ROS:  Please see the history of present illness.  All other systems are reviewed and otherwise negative.   PHYSICAL EXAM:  VS:  There were no vitals taken for this visit. BMI: There is no height or weight on file to calculate BMI. Well nourished, well developed, in no acute distress  HEENT:  normocephalic, atraumatic  Neck: no JVD, carotid bruits or masses Cardiac:  *** RRR; (paced) no significant murmurs, no rubs, or gallops Lungs:  *** CTA b/l, no wheezing, rhonchi or rales  Abd: soft, nontender, obese  MS: no deformity or atrophy Ext: *** no edema or skin changes noted. Skin: warm and dry, no rash Neuro:  No gross deficits appreciated Psych: euthymic mood, full affect  *** PPM site is stable, no tethering or discomfort   EKG:  Not done today  01/24/2020 personally reviewed: AF, V paced   PPM interrogation done today and reviewed by myself:  Battery and lead testing are good, no HVR  Cardiac catheterization 06/07/2018 LM irregularities LAD mild diffuse disease; D1 ostial 99, superior branch occluded/larger inferior branch 99 -  unchanged from 2014 and 2016 LCx mild diffuse disease RCA proximal 40, mid stent patent, 30   Echocardiogram 06/06/2018 EF 45-50, mild LVH, indeterminate diastolic function, anterior septal and inferior akinesis, normal RVSF, moderate LAE, mild aortic valve calcification  Echocardiogram 10/16/2014 Moderate LVH, anterior and periapical hypokinesis, EF 35-40, trivial MR, mild LAE, mild RAE    Recent Labs: 04/15/2019: TSH 2.030 01/23/2020: ALT 19; B Natriuretic Peptide 70.1; BUN 17; Creatinine, Ser 1.12; Hemoglobin 15.6; Platelets 128; Potassium 3.7; Sodium 138  10/07/2019: Cholesterol 145; HDL 45.80; LDL Cholesterol 68; Total CHOL/HDL Ratio 3; Triglycerides 156.0; VLDL 31.2   CrCl cannot be calculated (Patient's most recent lab result is older than the maximum 21 days allowed.).   Wt Readings from Last 3 Encounters:  02/06/20 235 lb (106.6 kg)  02/03/20 231 lb (104.8 kg)  01/15/20 234 lb 14.4 oz (106.5 kg)     Other studies reviewed: Additional studies/records reviewed today include: summarized above  ASSESSMENT AND PLAN:  1. PPM     *** Intact function, no changes made  2. Permanent Afib     S/p AV node ablation      CHA2DS2Vasc is, 5 on *** warfarin, monitored and managed by his PMD  3. CAD     CP not an ongoing complaint, sounded atypical, he has known branch vessel disease and patent stent by cath last year      On plavix, nitrate, BB, statin  4. Chronic CHF (combined)     LVEF by evaluation last year 45-50%     *** Exam does not suggest volume OL     His DOE is unchanged for a couple years, known COPD     On BB/ACE, furosemide    5. HTN     Relative hypotension fatgue     ***    BPs here  Right 88/56 >> 92/60 Left 90/56 >> 90/56  6. HLD     Monitored and managed with his PMD   Disposition: ***     Current medicines are reviewed at length with the patient today.  The patient did not have any concerns regarding medicines.  Venetia Night, PA-C 03/05/2020 6:02 AM     [facility] [address] [place] [address] [phone] (office)  [phone] (fax)

## 2020-03-06 LAB — CUP PACEART REMOTE DEVICE CHECK
Battery Impedance: 571 Ohm
Battery Remaining Longevity: 89 mo
Battery Voltage: 2.78 V
Brady Statistic RV Percent Paced: 100 %
Date Time Interrogation Session: 20211119151557
Implantable Lead Implant Date: 20141010
Implantable Lead Implant Date: 20141010
Implantable Lead Location: 753859
Implantable Lead Location: 753860
Implantable Lead Model: 5076
Implantable Lead Model: 5076
Implantable Pulse Generator Implant Date: 20141010
Lead Channel Impedance Value: 67 Ohm
Lead Channel Impedance Value: 688 Ohm
Lead Channel Pacing Threshold Amplitude: 0.75 V
Lead Channel Pacing Threshold Pulse Width: 0.4 ms
Lead Channel Setting Pacing Amplitude: 2.5 V
Lead Channel Setting Pacing Pulse Width: 0.4 ms
Lead Channel Setting Sensing Sensitivity: 4 mV

## 2020-03-08 NOTE — Progress Notes (Signed)
Remote pacemaker transmission.   

## 2020-03-09 ENCOUNTER — Ambulatory Visit: Payer: Medicare Other

## 2020-03-09 ENCOUNTER — Telehealth: Payer: Self-pay

## 2020-03-09 NOTE — Telephone Encounter (Signed)
Patient called back and rescheduled appointment for 03/16/2020 at 1415

## 2020-03-09 NOTE — Telephone Encounter (Signed)
Patient had a coumadin clinic appointment today at 1400 that he did not attend. Called and left voicemail for patient to return call to re-schedule appointment for his INR check.

## 2020-03-15 ENCOUNTER — Ambulatory Visit (INDEPENDENT_AMBULATORY_CARE_PROVIDER_SITE_OTHER): Payer: Medicare Other

## 2020-03-15 DIAGNOSIS — Z7901 Long term (current) use of anticoagulants: Secondary | ICD-10-CM | POA: Diagnosis not present

## 2020-03-15 LAB — POCT INR: INR: 1.8 — AB (ref 2.0–3.0)

## 2020-03-15 NOTE — Patient Instructions (Addendum)
Pre visit review using our clinic review tool, if applicable. No additional management support is needed unless otherwise documented below in the visit note.  Increase dose today to 7.5 mg then continue taking 2.5mg  daily except take 5mg  on Monday and Thursdays . Recheck in 3 wks.

## 2020-03-16 ENCOUNTER — Ambulatory Visit: Payer: Medicare Other

## 2020-03-25 ENCOUNTER — Other Ambulatory Visit: Payer: Self-pay | Admitting: Primary Care

## 2020-03-25 DIAGNOSIS — F339 Major depressive disorder, recurrent, unspecified: Secondary | ICD-10-CM

## 2020-04-06 ENCOUNTER — Other Ambulatory Visit: Payer: Self-pay

## 2020-04-06 ENCOUNTER — Ambulatory Visit (INDEPENDENT_AMBULATORY_CARE_PROVIDER_SITE_OTHER): Payer: Medicare Other

## 2020-04-06 DIAGNOSIS — Z7901 Long term (current) use of anticoagulants: Secondary | ICD-10-CM | POA: Diagnosis not present

## 2020-04-06 LAB — POCT INR: INR: 2.5 (ref 2.0–3.0)

## 2020-04-06 NOTE — Patient Instructions (Addendum)
Pre visit review using our clinic review tool, if applicable. No additional management support is needed unless otherwise documented below in the visit note.  Continue taking 2.5mg daily except take 5mg on Monday and Thursdays . Recheck in 4 wks. 

## 2020-04-12 ENCOUNTER — Encounter: Payer: Self-pay | Admitting: Family Medicine

## 2020-04-12 ENCOUNTER — Other Ambulatory Visit: Payer: Self-pay

## 2020-04-12 ENCOUNTER — Other Ambulatory Visit (INDEPENDENT_AMBULATORY_CARE_PROVIDER_SITE_OTHER): Payer: Medicare Other | Admitting: Family Medicine

## 2020-04-12 ENCOUNTER — Telehealth (INDEPENDENT_AMBULATORY_CARE_PROVIDER_SITE_OTHER): Payer: Medicare Other | Admitting: Family Medicine

## 2020-04-12 ENCOUNTER — Ambulatory Visit
Admission: RE | Admit: 2020-04-12 | Discharge: 2020-04-12 | Disposition: A | Discharge status: Home / Self Care | Payer: Medicare Other | Attending: Family Medicine | Admitting: Family Medicine

## 2020-04-12 ENCOUNTER — Other Ambulatory Visit: Payer: Medicare Other

## 2020-04-12 ENCOUNTER — Ambulatory Visit
Admission: RE | Admit: 2020-04-12 | Discharge: 2020-04-12 | Disposition: A | Discharge status: Home / Self Care | Payer: Medicare Other | Source: Ambulatory Visit | Attending: Family Medicine | Admitting: Family Medicine

## 2020-04-12 DIAGNOSIS — R0789 Other chest pain: Secondary | ICD-10-CM | POA: Diagnosis not present

## 2020-04-12 DIAGNOSIS — J439 Emphysema, unspecified: Secondary | ICD-10-CM | POA: Diagnosis not present

## 2020-04-12 DIAGNOSIS — J069 Acute upper respiratory infection, unspecified: Secondary | ICD-10-CM | POA: Insufficient documentation

## 2020-04-12 DIAGNOSIS — J811 Chronic pulmonary edema: Secondary | ICD-10-CM | POA: Diagnosis not present

## 2020-04-12 DIAGNOSIS — R059 Cough, unspecified: Secondary | ICD-10-CM | POA: Diagnosis not present

## 2020-04-12 LAB — POC INFLUENZA A&B (BINAX/QUICKVUE)
Influenza A, POC: NEGATIVE
Influenza B, POC: NEGATIVE

## 2020-04-12 MED ORDER — PREDNISONE 10 MG PO TABS: ORAL_TABLET | ORAL | 0 refills | Status: DC

## 2020-04-12 MED ORDER — GUAIFENESIN-CODEINE 100-10 MG/5ML PO SOLN: 5.0000 mL | Freq: Three times a day (TID) | ORAL | 0 refills | Status: DC | PRN

## 2020-04-12 MED ORDER — ALBUTEROL SULFATE HFA 108 (90 BASE) MCG/ACT IN AERS: 2.0000 | INHALATION_SPRAY | RESPIRATORY_TRACT | 0 refills | Status: DC | PRN

## 2020-04-12 NOTE — Assessment & Plan Note (Signed)
Exacerbation with resp illness Prednisone sent ([facility]) along with albuterol Meds ordered this encounter  Medications  . predniSONE (DELTASONE) 10 MG tablet    Sig: Take 3 pills once daily by mouth for 3 days, then 2 pills once daily for 3 days, then 1 pill once daily for 3 days and then stop    Dispense:  18 tablet    Refill:  0  . albuterol (VENTOLIN HFA) 108 (90 Base) MCG/ACT inhaler    Sig: Inhale 2 puffs into the lungs every 4 (four) hours as needed for wheezing or shortness of breath.    Dispense:  1 each    Refill:  0    Spacer  . guaiFENesin-codeine 100-10 MG/5ML syrup    Sig: Take 5 mLs by mouth 3 (three) times daily as needed for cough. Caution of sedation    Dispense:  80 mL    Refill:  0

## 2020-04-12 NOTE — Assessment & Plan Note (Signed)
Some symptoms for a month in pt with copd Now worse past 3 d with symptoms of fever  Is imm for covid but no booster   Ordered swab for covid and flu  cxr ordered at [facility] Adv to stay in and isolate Treat symptoms   Px robitussin with codeine for cough Prednisone (disc side eff incl glucose elevation) , and albuterol mdi  inst to update if worse  If very sob-go to ER  Meds ordered this encounter  Medications  . predniSONE (DELTASONE) 10 MG tablet    Sig: Take 3 pills once daily by mouth for 3 days, then 2 pills once daily for 3 days, then 1 pill once daily for 3 days and then stop    Dispense:  18 tablet    Refill:  0  . albuterol (VENTOLIN HFA) 108 (90 Base) MCG/ACT inhaler    Sig: Inhale 2 puffs into the lungs every 4 (four) hours as needed for wheezing or shortness of breath.    Dispense:  1 each    Refill:  0    Spacer  . guaiFENesin-codeine 100-10 MG/5ML syrup    Sig: Take 5 mLs by mouth 3 (three) times daily as needed for cough. Caution of sedation    Dispense:  80 mL    Refill:  0

## 2020-04-12 NOTE — Progress Notes (Signed)
Virtual Visit via Telephone Note  I connected with [name] Cook on 04/12/20 at  2:30 PM EST by telephone and verified that I am speaking with the correct person using two identifiers.  Location: Patient: home Provider: office    I discussed the limitations, risks, security and privacy concerns of performing an evaluation and management service by telephone and the availability of in person appointments. I also discussed with the patient that there may be a patient responsible charge related to this service. The patient expressed understanding and agreed to proceed.  Parties involved in encounter  Patient: [name] Cook  Provider:  Loura Pardon MD   History of Present Illness: 69 yo pt of NP Carlis Abbott presents with uri symptoms for a month  He has a h/o copd   Hot and cold  Aches all over for 3 d  No thermometer   Runny nose  Nasal congestion  Not getting mucous out   Some sob -exertion No wheezing  No inhalers  Chest does feel tight   Throat is not sore  Ears do not hurt  Can taste/not good smell right now   Some loose stool, that has been going on for " a while"    No covid contacts - immunized , booster   Cough attacks-hard to stop Dry cough   otc Day quil  mucinex     Patient Active Problem List   Diagnosis Date Noted   URI with cough and congestion 04/12/2020   Acute diverticulitis 02/06/2020   Preventative health care 10/09/2019   Ear canal dryness, bilateral 08/12/2019   Polyp of colon    Decreased renal function 06/27/2018   Prostate cancer ([place]) 06/10/2018   Chest pain 06/04/2018   OSA (obstructive sleep apnea) 10/01/2017   Encounter for screening colonoscopy 10/01/2017   Osteoarthritis 06/26/2017   Insomnia 10/01/2014   Depression 03/09/2014   Coronary artery disease 11/06/2013   Long term current use of anticoagulant therapy 06/19/2013   Pacemaker 04/30/2013   Acute myocardial infarction, subendocardial infarction, initial episode  of care ([place]) 03/15/2013   Abnormal LFTs 05/02/2012   Chronic kidney disease, 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/26/2011   Chronic obstructive pulmonary disease ([place]) 04/20/2011   Atrial fibrillation    Hypertension    Hyperlipidemia    Diabetes mellitus type 2, uncomplicated ([place])    Atrial flutter ([place])    GASTROESOPHAGEAL REFLUX DISEASE 05/16/2010   Past Medical History:  Diagnosis Date   Arthritis    "knees and lower back" (03/14/2013)   Atrial flutter ([place])    radiofrequency ablation in 2001   CAD (coronary artery disease)    a. Nonobstructive. Cardiac cath in 2001-50% mid RI, normal LM, LAD, RCA b. cath 10/16/2014 95% mid RCA treated with DES, 99% ost D1 medical management due to small aneurysmal segment   Chronic anticoagulation    chronic Coumadin anticoagulation   Chronic obstructive pulmonary disease ([place]) 04/20/2011   Diabetes mellitus, type 2 (HCC)    GERD (gastroesophageal reflux disease)    Hyperlipidemia    Hypertension    with hypertensive heart disease   Left knee pain 10/25/2017   medial   Obesity    Persistent atrial fibrillation ([place])    recurrent atrial flutter since 2001 s/p DCCVs, multiple failed AADs, h/o tachy-mediated cardiomyopathy   Shortness of breath    "can come on at any time" (03/14/2013)   Sleep apnea    "dx'd; couldn't wear the mask" (03/14/2013)   Tobacco abuse    Past  Surgical History:  Procedure Laterality Date   ATRIAL FLUTTER ABLATION  2002   atrial flutter; subsequently developed atrial fibrillation   AV NODE ABLATION  01/24/2013   CARDIAC CATHETERIZATION  2002   CARDIAC CATHETERIZATION N/A 10/16/2014   Procedure: Left Heart Cath and Coronary Angiography;  Surgeon: Tonny Bollman, MD;  Location: [facility] INVASIVE CV LAB;  Service: Cardiovascular;  Laterality: N/A;   CARDIOVERSION  05/31/2011   Procedure: CARDIOVERSION;  Surgeon: [name]. [name], MD;  Location: [facility] ORS;  Service: Cardiovascular;  Laterality: N/A;   CARPAL  TUNNEL RELEASE Left 1980's   COLONOSCOPY WITH PROPOFOL N/A 12/30/2018   Procedure: COLONOSCOPY WITH PROPOFOL;  Surgeon: Wyline Mood, MD;  Location: [facility] ENDOSCOPY;  Service: Gastroenterology;  Laterality: N/A;   COLONOSCOPY WITH PROPOFOL N/A 12/04/2019   Procedure: COLONOSCOPY WITH PROPOFOL;  Surgeon: Wyline Mood, MD;  Location: [facility] ENDOSCOPY;  Service: Gastroenterology;  Laterality: N/A;   INSERT / REPLACE / REMOVE PACEMAKER  01/24/2013    Medtronic Adapta L dual-chamber pacemaker, serial number [id number]    LEFT HEART CATH AND CORONARY ANGIOGRAPHY N/A 06/07/2018   Procedure: LEFT HEART CATH AND CORONARY ANGIOGRAPHY;  Surgeon: Tonny Bollman, MD;  Location: [facility] INVASIVE CV LAB;  Service: Cardiovascular;  Laterality: N/A;   LEFT HEART CATHETERIZATION WITH CORONARY ANGIOGRAM N/A 03/17/2013   Procedure: LEFT HEART CATHETERIZATION WITH CORONARY ANGIOGRAM;  Surgeon: Kathleene Hazel, MD; LAD mild dz, D1 branch 100%, inferior branch 99%, CFX OK, RCA 50%, EF 65%     LOOP RECORDER IMPLANT  2002   PERMANENT PACEMAKER INSERTION N/A 01/24/2013   Procedure: PERMANENT PACEMAKER INSERTION;  Surgeon: Marinus Maw, MD;  Location: [facility] CATH LAB;  Service: Cardiovascular;  Laterality: N/A;   TIBIAL TUBERCLERPLASTY  ~ 2003   Social History   Tobacco Use   Smoking status: Former Smoker    Packs/day: 1.00    Years: 42.00    Pack years: 42.00    Types: Cigarettes    Quit date: 12/31/2011    Years since quitting: 8.2   Smokeless tobacco: Never Used  Vaping Use   Vaping Use: Never used  Substance Use Topics   Alcohol use: Not Currently    Comment: 03/14/2013 "stopped drinking back in 2002; never had problem w/it"   Drug use: No   Family History  Problem Relation Age of Onset   Alzheimer's disease Mother    Osteoporosis Mother    No Known Allergies Current Outpatient Medications on File Prior to Visit  Medication Sig Dispense Refill   citalopram (CELEXA) 40 MG tablet TAKE 1  TABLET (40 MG TOTAL) BY MOUTH DAILY. FOR DEPRESSION. 90 tablet 0   clopidogrel (PLAVIX) 75 MG tablet TAKE 1 TABLET BY MOUTH EVERY DAY 90 tablet 3   diclofenac Sodium (VOLTAREN) 1 % GEL Apply 2 g topically 4 (four) times daily. 50 g 0   fish oil-omega-3 fatty acids 1000 MG capsule Take 1 g by mouth daily.       fluticasone (FLONASE) 50 MCG/ACT nasal spray PLACE 1 SPRAY INTO BOTH NOSTRILS DAILY AS NEEDED FOR ALLERGIES OR RHINITIS. 48 mL 3   furosemide (LASIX) 20 MG tablet Take 1 tablet (20 mg total) by mouth daily. 90 tablet 3   gabapentin (NEURONTIN) 300 MG capsule Take 1 capsule (300 mg total) by mouth 3 (three) times daily. 270 capsule 0   glucose blood (ONE TOUCH ULTRA TEST) test strip USE TO TEST UP TO 4 TIMES DAILY 100 each 2   isosorbide mononitrate (IMDUR) 60  MG 24 hr tablet TAKE 1 TABLET BY MOUTH EVERY DAY 90 tablet 2   JARDIANCE 10 MG TABS tablet TAKE 1 TABLET BY MOUTH EVERY MORNING FOR DIABETES 90 tablet 1   lisinopril (ZESTRIL) 2.5 MG tablet Take 1 tablet (2.5 mg total) by mouth daily. 90 tablet 1   metFORMIN (GLUCOPHAGE) 500 MG tablet TAKE 1 TABLET BY MOUTH TWICE DAILY WITH A MEAL 180 tablet 1   metoprolol succinate (TOPROL-XL) 25 MG 24 hr tablet TAKE 1 TABLET BY MOUTH EVERY DAY 90 tablet 3   nitroGLYCERIN (NITROSTAT) 0.4 MG SL tablet Place 1 tablet (0.4 mg total) under the tongue every 5 (five) minutes as needed for chest pain. 25 tablet 0   pantoprazole (PROTONIX) 40 MG tablet TAKE 1 TABLET (40 MG TOTAL) BY MOUTH DAILY FOR HEARTBURN. 90 tablet 1   rosuvastatin (CRESTOR) 20 MG tablet TAKE 1 TABLET BY MOUTH EVERY DAY IN THE EVENING FOR CHOLESTEROL 90 tablet 1   sitaGLIPtin (JANUVIA) 100 MG tablet TAKE 1 TABLET BY MOUTH ONCE DAILY FOR DIABETES. 90 tablet 1   tamsulosin (FLOMAX) 0.4 MG CAPS capsule TAKE 1 CAPSULE (0.4 MG TOTAL) BY MOUTH 2 (TWO) TIMES DAILY. 180 capsule 3   warfarin (COUMADIN) 5 MG tablet TAKE 1/2 TABLET EVERY DAY OTHER THAN MONDAYS. TAKE 1 FULL TABLET BY  MOUTH ON MONDAYS 48 tablet 1   No current facility-administered medications on file prior to visit.   Review of Systems  Constitutional: Positive for chills, fever and malaise/fatigue.  HENT: Positive for congestion. Negative for ear pain, sinus pain and sore throat.   Eyes: Negative for blurred vision, discharge and redness.  Respiratory: Positive for cough, shortness of breath and wheezing. Negative for sputum production and stridor.   Cardiovascular: Negative for chest pain, palpitations and leg swelling.  Gastrointestinal: Negative for abdominal pain, diarrhea, nausea and vomiting.  Musculoskeletal: Negative for myalgias.  Skin: Negative for rash.  Neurological: Negative for dizziness and headaches.    Observations/Objective: Pt sounds well, not distressed Mildly hoarse voice occ dry sounding cough No wheeze or sob audible with speech Nl cognition  Nl mood   Assessment and Plan: Problem List Items Addressed This Visit      Respiratory   Chronic obstructive pulmonary disease ([place])    Exacerbation with resp illness Prednisone sent (aware will inc glucose) along with albuterol Meds ordered this encounter  Medications   predniSONE (DELTASONE) 10 MG tablet    Sig: Take 3 pills once daily by mouth for 3 days, then 2 pills once daily for 3 days, then 1 pill once daily for 3 days and then stop    Dispense:  18 tablet    Refill:  0   albuterol (VENTOLIN HFA) 108 (90 Base) MCG/ACT inhaler    Sig: Inhale 2 puffs into the lungs every 4 (four) hours as needed for wheezing or shortness of breath.    Dispense:  1 each    Refill:  0    Spacer   guaiFENesin-codeine 100-10 MG/5ML syrup    Sig: Take 5 mLs by mouth 3 (three) times daily as needed for cough. Caution of sedation    Dispense:  80 mL    Refill:  0         Relevant Medications   predniSONE (DELTASONE) 10 MG tablet   albuterol (VENTOLIN HFA) 108 (90 Base) MCG/ACT inhaler   guaiFENesin-codeine 100-10 MG/5ML syrup    URI with cough and congestion - Primary    Some symptoms for a  month in pt with copd Now worse past 3 d with symptoms of fever  Is imm for covid but no booster   Ordered swab for covid and flu  cxr ordered at [facility] to stay in and isolate Treat symptoms   Px robitussin with codeine for cough Prednisone (disc side eff incl glucose elevation) , and albuterol mdi  inst to update if worse  If very sob-go to ER  Meds ordered this encounter  Medications   predniSONE (DELTASONE) 10 MG tablet    Sig: Take 3 pills once daily by mouth for 3 days, then 2 pills once daily for 3 days, then 1 pill once daily for 3 days and then stop    Dispense:  18 tablet    Refill:  0   albuterol (VENTOLIN HFA) 108 (90 Base) MCG/ACT inhaler    Sig: Inhale 2 puffs into the lungs every 4 (four) hours as needed for wheezing or shortness of breath.    Dispense:  1 each    Refill:  0    Spacer   guaiFENesin-codeine 100-10 MG/5ML syrup    Sig: Take 5 mLs by mouth 3 (three) times daily as needed for cough. Caution of sedation    Dispense:  80 mL    Refill:  0          Other Visit Diagnoses    Viral URI with cough       Relevant Medications   guaiFENesin-codeine 100-10 MG/5ML syrup       Follow Up Instructions: Rest and drink fluids  I sent in robitussin with codeine for cough (careful of sedation)  Prednisone and albuterol are for chest tightness and wheezing  Use as directed Prednisone will temporarily increase blood sugar readings  The office will call you to set up covid and flu swabs and a chest xray   If symptoms become suddenly severe please go to the ER   I discussed the assessment and treatment plan with the patient. The patient was provided an opportunity to ask questions and all were answered. The patient agreed with the plan and demonstrated an understanding of the instructions.   The patient was advised to call back or seek an in-person evaluation if the symptoms worsen or if the  condition fails to improve as anticipated.  I provided 17 minutes of non-face-to-face time during this encounter.   Loura Pardon, MD

## 2020-04-12 NOTE — Patient Instructions (Addendum)
Rest and drink fluids  I sent in robitussin with codeine for cough (careful of sedation)  Prednisone and albuterol are for chest tightness and wheezing  Use as directed Prednisone will temporarily increase blood sugar readings  The office will call you to set up covid and flu swabs and a chest xray   If symptoms become suddenly severe please go to the ER

## 2020-04-14 LAB — NOVEL CORONAVIRUS, NAA: SARS-CoV-2, NAA: DETECTED — AB

## 2020-04-14 LAB — SARS-COV-2, NAA 2 DAY TAT

## 2020-04-16 ENCOUNTER — Other Ambulatory Visit: Payer: Self-pay | Admitting: Primary Care

## 2020-04-16 DIAGNOSIS — E785 Hyperlipidemia, unspecified: Secondary | ICD-10-CM

## 2020-04-16 DIAGNOSIS — E119 Type 2 diabetes mellitus without complications: Secondary | ICD-10-CM

## 2020-04-21 ENCOUNTER — Ambulatory Visit: Payer: Medicare Other | Admitting: Primary Care

## 2020-04-21 ENCOUNTER — Telehealth: Payer: Self-pay | Admitting: Primary Care

## 2020-04-21 MED ORDER — CEFDINIR 300 MG PO CAPS: 300.0000 mg | ORAL_CAPSULE | Freq: Two times a day (BID) | ORAL | 0 refills | Status: DC

## 2020-04-21 NOTE — Telephone Encounter (Signed)
Given copd this is reasonable (though viral illness can cause yellow phlegm it seems to have worsened since we talked to him last) Sent in cefdinir  If worse or no improvement let us know-may want to repeat cxr

## 2020-04-21 NOTE — Telephone Encounter (Signed)
Pt called in needs [place] to call him

## 2020-04-21 NOTE — Telephone Encounter (Signed)
Called pt and he said he wants an antibiotic for his sxs. Pt said he is still coughing very bad, he said it feels like fluid is in his lungs, when he cough it's very productive and it's yellow thick mucous he is coughing up, pt also said he is blowing out yellow mucous from his nose. Pt said he has no fever, fatigue no SOB but feels like the "infection has moved to his lungs" and he wants an abx called into [facility]

## 2020-04-21 NOTE — Telephone Encounter (Signed)
Pt notified of Dr. Royden Purl comments and instructions and verbalized understanding. He will keep [place] posted

## 2020-04-25 ENCOUNTER — Other Ambulatory Visit: Payer: Self-pay | Admitting: Primary Care

## 2020-04-25 DIAGNOSIS — E119 Type 2 diabetes mellitus without complications: Secondary | ICD-10-CM

## 2020-05-04 ENCOUNTER — Other Ambulatory Visit: Payer: Self-pay

## 2020-05-04 ENCOUNTER — Telehealth: Payer: Self-pay

## 2020-05-04 ENCOUNTER — Ambulatory Visit (INDEPENDENT_AMBULATORY_CARE_PROVIDER_SITE_OTHER): Payer: Medicare Other

## 2020-05-04 DIAGNOSIS — Z7901 Long term (current) use of anticoagulants: Secondary | ICD-10-CM

## 2020-05-04 LAB — POCT INR: INR: 2.9 (ref 2.0–3.0)

## 2020-05-04 NOTE — Telephone Encounter (Signed)
Pt in today for coumadin clinic and reported he still has chest congestion and a cough. He reports the neither symptom is worse but neither is any better either. Pt denies any other symptoms. He wants to know if he should be tested again. Pt denies any other symptoms. Advised the Dr. Glori Bickers wrote in her note that he may need a repeat CXR but a msg would be sent to her and this office would f/u. Pt verbalized understanding.   Pt also reported he will be bringing his mother in law in to see Anda Kraft in 2 or 3 days for her apt. Just an FYI if there is any concern for him coming back into the office.

## 2020-05-04 NOTE — Telephone Encounter (Signed)
Pt left VM yesterday about apt today but did not say what he wanted to do about apt. There was bad weather a couple of days ago and roads are still a little covered. Tried to contact pt but no answer and VM is full.

## 2020-05-04 NOTE — Patient Instructions (Addendum)
Pre visit review using our clinic review tool, if applicable. No additional management support is needed unless otherwise documented below in the visit note.  Continue taking 2.5mg daily except take 5mg on Monday and Thursdays . Recheck in 4 wks. 

## 2020-05-04 NOTE — Telephone Encounter (Signed)
Agree with Dr. Glori Bickers, I am happy to see him in the office.

## 2020-05-04 NOTE — Telephone Encounter (Signed)
He tested positive on 12/27 -it has been enough days now to not be contagious  He has had prednisone and cefdinir  At this pont I think he needs a face to face visit with exam (here or resp clinic perhaps)   Would not re test for covid since he had it and test would likely still be positive   Will route to sender and pcp

## 2020-05-05 ENCOUNTER — Ambulatory Visit (INDEPENDENT_AMBULATORY_CARE_PROVIDER_SITE_OTHER): Payer: Medicare Other | Admitting: Primary Care

## 2020-05-05 ENCOUNTER — Ambulatory Visit (INDEPENDENT_AMBULATORY_CARE_PROVIDER_SITE_OTHER)
Admission: RE | Admit: 2020-05-05 | Discharge: 2020-05-05 | Disposition: A | Discharge status: Home / Self Care | Payer: Medicare Other | Source: Ambulatory Visit | Attending: Primary Care | Admitting: Primary Care

## 2020-05-05 ENCOUNTER — Encounter: Payer: Self-pay | Admitting: Primary Care

## 2020-05-05 ENCOUNTER — Other Ambulatory Visit: Payer: Medicare Other

## 2020-05-05 VITALS — BP 108/62 | HR 97 | Temp 97.7°F | Ht 71.0 in | Wt 228.0 lb

## 2020-05-05 DIAGNOSIS — J439 Emphysema, unspecified: Secondary | ICD-10-CM

## 2020-05-05 DIAGNOSIS — J069 Acute upper respiratory infection, unspecified: Secondary | ICD-10-CM

## 2020-05-05 DIAGNOSIS — R059 Cough, unspecified: Secondary | ICD-10-CM | POA: Diagnosis not present

## 2020-05-05 NOTE — Assessment & Plan Note (Signed)
Recent symptoms could very well be secondary to COPD aggravation from Covid.  Start with repeat chest xray today, if negative then we will send in LABA/ICS inhaler.  Continue albuterol for now.  He is in no distress and does not appear sickly.

## 2020-05-05 NOTE — Telephone Encounter (Signed)
Appointment made for tomorrow morning. 

## 2020-05-05 NOTE — Assessment & Plan Note (Signed)
Continued since COVID-19, does not appear sickly or in distress today.  Lungs clear.  Repeat chest x-ray pending today. If x-ray negative, then we will send in Symbicort for COPD treatment.  Continue albuterol as needed.

## 2020-05-05 NOTE — Telephone Encounter (Signed)
Patient evaluated.  

## 2020-05-05 NOTE — Patient Instructions (Signed)
Complete xray(s) prior to leaving today. I will notify you of your results once received.  It was a pleasure to see you today!  

## 2020-05-05 NOTE — Progress Notes (Signed)
Subjective:    Patient ID: [name] Cook, male    DOB: January 25, 1951, 70 y.o.   MRN: [id number]  HPI  This visit occurred during the SARS-CoV-2 public health emergency.  Safety protocols were in place, including screening questions prior to the visit, additional usage of staff PPE, and extensive cleaning of exam room while observing appropriate contact time as indicated for disinfecting solutions.   Mr. Veiga is a 70 year old male with a history of atrial fibrillation, hypertension, CAD with acute MI, COPD, OSA, type 2 diabetes, prostate cancer, CKD, Covid-19 infection who presents today with a chief complaint of cough.   Diagnosed with Covid-19 about three weeks ago for symptoms of cough, head congestion, chest congestion, chills. Evaluated by Dr. Glori Bickers at the time via phone, was treated with prednisone, Cheratussin, albuterol.  One week later he called in stating that his symptoms were continued so Cefdinir was sent into his pharmacy.   Since then he continues to notice cough and chest congestion. His cough is non productive, intermittent shortness of breath with mild exertion. He underwent chest xray on 04/12/20 which was negative for pneumonia. He completed his course of antibiotics.   He's been taking Mucinex and Cheratussin without improvement. He's been using his albuterol once daily without improvement. The prednisone helped the most.    Review of Systems  Constitutional: Negative for chills, fatigue and fever.  HENT: Positive for congestion.   Respiratory: Positive for cough and shortness of breath.   Cardiovascular: Negative for chest pain.       Past Medical History:  Diagnosis Date  . Arthritis    "knees and lower back" (03/14/2013)  . Atrial flutter ([place])    radiofrequency ablation in 2001  . CAD (coronary artery disease)    a. Nonobstructive. Cardiac cath in 2001-50% mid RI, normal LM, LAD, RCA b. cath 10/16/2014 95% mid RCA treated with DES, 99% ost D1 medical management due  to small aneurysmal segment  . Chronic anticoagulation    chronic Coumadin anticoagulation  . Chronic obstructive pulmonary disease ([place]) 04/20/2011  . Diabetes mellitus, type 2 ([place])   . GERD (gastroesophageal reflux disease)   . Hyperlipidemia   . Hypertension    with hypertensive heart disease  . Left knee pain 10/25/2017   medial  . Obesity   . Persistent atrial fibrillation ([facility])    recurrent atrial flutter since 2001 s/p DCCVs, multiple failed AADs, h/o tachy-mediated cardiomyopathy  . Shortness of breath    "can come on at any time" (03/14/2013)  . Sleep apnea    "dx'd; couldn't wear the mask" (03/14/2013)  . Tobacco abuse      Social History   Socioeconomic History  . Marital status: Widowed    Spouse name: Not on file  . Number of children: 1  . Years of education: Not on file  . Highest education level: Not on file  Occupational History  . Occupation: Merchandiser, retail: UNEMPLOYED  Tobacco Use  . Smoking status: Former Smoker    Packs/day: 1.00    Years: 42.00    Pack years: 42.00    Types: Cigarettes    Quit date: 12/31/2011    Years since quitting: 8.3  . Smokeless tobacco: Never Used  Vaping Use  . Vaping Use: Never used  Substance and Sexual Activity  . Alcohol use: Not Currently    Comment: 03/14/2013 "stopped drinking back in 2002; never had problem w/it"  . Drug use: No  .  Sexual activity: Not Currently  Other Topics Concern  . Not on file  Social History Narrative   Single.   Retired.    1 son, deceased.    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   Enjoys playing on the computer.       Social Determinants of Health   Financial Resource Strain: Low Risk   . Difficulty of Paying Living Expenses: Not hard at all  Food Insecurity: No Food Insecurity  . Worried About Charity fundraiser in the Last Year: Never true  . Ran Out of Food in the Last Year: Never true  Transportation Needs: No  Transportation Needs  . Lack of Transportation (Medical): No  . Lack of Transportation (Non-Medical): No  Physical Activity: Inactive  . Days of Exercise per Week: 0 days  . Minutes of Exercise per Session: 0 min  Stress: No Stress Concern Present  . Feeling of Stress : Not at all  Social Connections: Not on file  Intimate Partner Violence: Not At Risk  . Fear of Current or Ex-Partner: No  . Emotionally Abused: No  . Physically Abused: No  . Sexually Abused: No    Past Surgical History:  Procedure Laterality Date  . ATRIAL FLUTTER ABLATION  2002   atrial flutter; subsequently developed atrial fibrillation  . AV NODE ABLATION  01/24/2013  . CARDIAC CATHETERIZATION  2002  . CARDIAC CATHETERIZATION N/A 10/16/2014   Procedure: Left Heart Cath and Coronary Angiography;  Surgeon: [name], MD;  Location: [place] [facility];  Service: Cardiovascular;  Laterality: N/A;  . CARDIOVERSION  05/31/2011   Procedure: CARDIOVERSION;  Surgeon: Cristopher Estimable. Lattie Haw, MD;  Location: [facility];  Service: Cardiovascular;  Laterality: N/A;  . CARPAL TUNNEL RELEASE Left 1980's  . COLONOSCOPY WITH PROPOFOL N/A 12/30/2018   Procedure: COLONOSCOPY WITH PROPOFOL;  Surgeon: Jonathon Bellows, MD;  Location: [facility] ENDOSCOPY;  Service: Gastroenterology;  Laterality: N/A;  . COLONOSCOPY WITH PROPOFOL N/A 12/04/2019   Procedure: COLONOSCOPY WITH PROPOFOL;  Surgeon: Jonathon Bellows, MD;  Location: [facility] ENDOSCOPY;  Service: Gastroenterology;  Laterality: N/A;  . INSERT / REPLACE / REMOVE PACEMAKER  01/24/2013    Medtronic Adapta L dual-chamber pacemaker, serial number NWE [id number]   . LEFT HEART CATH AND CORONARY ANGIOGRAPHY N/A 06/07/2018   Procedure: LEFT HEART CATH AND CORONARY ANGIOGRAPHY;  Surgeon: [name], MD;  Location: [place] [facility];  Service: Cardiovascular;  Laterality: N/A;  . LEFT HEART CATHETERIZATION WITH CORONARY ANGIOGRAM N/A 03/17/2013   Procedure: LEFT HEART CATHETERIZATION WITH CORONARY ANGIOGRAM;   Surgeon: Burnell Blanks, MD; LAD mild dz, D1 branch 100%, inferior branch 99%, CFX OK, RCA 50%, EF 65%    . LOOP RECORDER IMPLANT  2002  . PERMANENT PACEMAKER INSERTION N/A 01/24/2013   Procedure: PERMANENT PACEMAKER INSERTION;  Surgeon: [name], MD;  [facility] C[facility] LAB;  Service: Cardiovascular;  Laterality: N/A;  . TIBIAL TUBERCLERPLASTY  ~ 2003    Family History  Problem Relation Age of Onset  . Alzheimer's disease Mother   . Osteoporosis Mother     No Known Allergies  Current Outpatient Medications on File Prior to Visit  Medication Sig Dispense Refill  . albuterol (VENTOLIN HFA) 108 (90 Base) MCG/ACT inhaler Inhale 2 puffs into the lungs every 4 (four) hours as needed for wheezing or shortness of breath. 1 each 0  . citalopram (CELEXA) 40 MG tablet TAKE 1 TABLET (40 MG TOTAL) BY MOUTH DAILY. FOR DEPRESSION.  90 tablet 0  . clopidogrel (PLAVIX) 75 MG tablet TAKE 1 TABLET BY MOUTH EVERY DAY 90 tablet 3  . diclofenac Sodium (VOLTAREN) 1 % GEL Apply 2 g topically 4 (four) times daily. 50 g 0  . fish oil-omega-3 fatty acids 1000 MG capsule Take 1 g by mouth daily.      . fluticasone (FLONASE) 50 MCG/ACT nasal spray PLACE 1 SPRAY INTO BOTH NOSTRILS DAILY AS NEEDED FOR ALLERGIES OR RHINITIS. 48 mL 3  . furosemide (LASIX) 20 MG tablet Take 1 tablet (20 mg total) by mouth daily. 90 tablet 3  . gabapentin (NEURONTIN) 300 MG capsule Take 1 capsule (300 mg total) by mouth 3 (three) times daily. 270 capsule 0  . glucose blood (ONE TOUCH ULTRA TEST) test strip USE TO TEST UP TO 4 TIMES DAILY 100 each 2  . guaiFENesin-codeine 100-10 MG/5ML syrup Take 5 mLs by mouth 3 (three) times daily as needed for cough. Caution of sedation 80 mL 0  . isosorbide mononitrate (IMDUR) 60 MG 24 hr tablet TAKE 1 TABLET BY MOUTH EVERY DAY 90 tablet 2  . JARDIANCE 10 MG TABS tablet TAKE 1 TABLET BY MOUTH EVERY MORNING FOR DIABETES 90 tablet 1  . lisinopril (ZESTRIL) 2.5 MG tablet Take 1 tablet (2.5 mg  total) by mouth daily. 90 tablet 1  . metFORMIN (GLUCOPHAGE) 500 MG tablet TAKE 1 TABLET BY MOUTH TWICE DAILY WITH A MEAL 180 tablet 1  . metoprolol succinate (TOPROL-XL) 25 MG 24 hr tablet TAKE 1 TABLET BY MOUTH EVERY DAY 90 tablet 3  . nitroGLYCERIN (NITROSTAT) 0.4 MG SL tablet Place 1 tablet (0.4 mg total) under the tongue every 5 (five) minutes as needed for chest pain. 25 tablet 0  . pantoprazole (PROTONIX) 40 MG tablet TAKE 1 TABLET (40 MG TOTAL) BY MOUTH DAILY FOR HEARTBURN. 90 tablet 1  . rosuvastatin (CRESTOR) 20 MG tablet TAKE 1 TABLET BY MOUTH EVERY DAY IN THE EVENING FOR CHOLESTEROL 90 tablet 1  . sitaGLIPtin (JANUVIA) 100 MG tablet TAKE 1 TABLET BY MOUTH ONCE DAILY FOR DIABETES. 90 tablet 1  . tamsulosin (FLOMAX) 0.4 MG CAPS capsule TAKE 1 CAPSULE (0.4 MG TOTAL) BY MOUTH 2 (TWO) TIMES DAILY. 180 capsule 3  . warfarin (COUMADIN) 5 MG tablet TAKE 1/2 TABLET EVERY DAY OTHER THAN MONDAYS. TAKE 1 FULL TABLET BY MOUTH ON MONDAYS 48 tablet 1   No current facility-administered medications on file prior to visit.    BP 108/62   Pulse 97   Temp 97.7 F (36.5 C) (Temporal)   Ht 5\' 11"  (1.803 m)   Wt 228 lb (103.4 kg)   SpO2 97%   BMI 31.80 kg/m    Objective:   Physical Exam Constitutional:      General: He is not in acute distress.    Appearance: He is not ill-appearing.  Cardiovascular:     Rate and Rhythm: Normal rate and regular rhythm.     Pulses: Normal pulses.     Heart sounds: Normal heart sounds.  Pulmonary:     Effort: Pulmonary effort is normal.     Breath sounds: Normal breath sounds. No wheezing or rhonchi.  Neurological:     Mental Status: He is alert and oriented to person, place, and time.  Psychiatric:        Mood and Affect: Mood normal.            Assessment & Plan:

## 2020-05-06 ENCOUNTER — Other Ambulatory Visit: Payer: Self-pay | Admitting: Primary Care

## 2020-05-06 ENCOUNTER — Ambulatory Visit: Payer: Medicare Other | Admitting: Primary Care

## 2020-05-06 DIAGNOSIS — J439 Emphysema, unspecified: Secondary | ICD-10-CM

## 2020-05-06 MED ORDER — BUDESONIDE-FORMOTEROL FUMARATE 160-4.5 MCG/ACT IN AERO: 2.0000 | INHALATION_SPRAY | Freq: Two times a day (BID) | RESPIRATORY_TRACT | 3 refills | Status: DC

## 2020-05-12 ENCOUNTER — Other Ambulatory Visit: Payer: Self-pay | Admitting: Primary Care

## 2020-05-12 DIAGNOSIS — F339 Major depressive disorder, recurrent, unspecified: Secondary | ICD-10-CM

## 2020-05-20 ENCOUNTER — Telehealth: Payer: Self-pay | Admitting: Primary Care

## 2020-05-20 NOTE — Telephone Encounter (Signed)
Patient droop off paper work . Please call when to pick up

## 2020-05-21 NOTE — Telephone Encounter (Signed)
Patient is requesting an excuse for his jury summons. Placed on cart for delivery to [facility] office.

## 2020-05-24 NOTE — Telephone Encounter (Signed)
I need specific reasons, we can't excuse him because he was told he couldn't attend. He either needs to have his cardiologist complete this request or he needs to provide [place] with reasons for why he was told he could never attend.

## 2020-05-24 NOTE — Telephone Encounter (Signed)
Called patient states that he was told by cardiology that he did not need to be under stress. I informed him that it may need to come from cardiology for that reason. He will come by and pick up ppw to take to his office. I have placed at reception for him.

## 2020-05-24 NOTE — Telephone Encounter (Signed)
This is the first I am hearing of his request for Jury Duty excuse. What is his reason for excuse?

## 2020-05-24 NOTE — Telephone Encounter (Signed)
Called patient states Cardiologist told him last time that he should not do.

## 2020-05-24 NOTE — Telephone Encounter (Signed)
Left message to return call to our office.  

## 2020-05-24 NOTE — Telephone Encounter (Signed)
Did you get this?

## 2020-05-27 ENCOUNTER — Other Ambulatory Visit: Payer: Self-pay

## 2020-05-27 ENCOUNTER — Encounter: Payer: Self-pay | Admitting: Physician Assistant

## 2020-05-27 ENCOUNTER — Ambulatory Visit (INDEPENDENT_AMBULATORY_CARE_PROVIDER_SITE_OTHER): Payer: Medicare Other | Admitting: Physician Assistant

## 2020-05-27 VITALS — BP 90/60 | HR 88 | Ht 71.0 in | Wt 227.0 lb

## 2020-05-27 DIAGNOSIS — I4821 Permanent atrial fibrillation: Secondary | ICD-10-CM | POA: Diagnosis not present

## 2020-05-27 DIAGNOSIS — Z950 Presence of cardiac pacemaker: Secondary | ICD-10-CM | POA: Diagnosis not present

## 2020-05-27 DIAGNOSIS — I2510 Atherosclerotic heart disease of native coronary artery without angina pectoris: Secondary | ICD-10-CM

## 2020-05-27 DIAGNOSIS — I9589 Other hypotension: Secondary | ICD-10-CM

## 2020-05-27 MED ORDER — METOPROLOL SUCCINATE ER 25 MG PO TB24: 12.5000 mg | ORAL_TABLET | Freq: Every day | ORAL | 3 refills | Status: DC

## 2020-05-27 NOTE — Patient Instructions (Addendum)
Medication Instructions:   START TAKING TOPROL XL  12.5 MG MG ONCE A DAY   *If you need a refill on your cardiac medications before your next appointment, please call your pharmacy*   Lab Work: NONE ORDERED  TODAY    If you have labs (blood work) drawn today and your tests are completely normal, you will receive your results only by:  [place] (if you have MyChart) OR  A paper copy in the mail If you have any lab test that is abnormal or we need to change your treatment, we will call you to review the results.   Testing/Procedures: NONE ORDERED  TODAY   Follow-Up: At [facility], you and your health needs are our priority.  As part of our continuing mission to provide you with exceptional heart care, we have created designated Provider Care Teams.  These Care Teams include your primary Cardiologist (physician) and Advanced Practice Providers (APPs -  Physician Assistants and Nurse Practitioners) who all work together to provide you with the care you need, when you need it.  We recommend signing up for the patient portal called "MyChart".  Sign up information is provided on this After Visit Summary.  MyChart is used to connect with patients for Virtual Visits (Telemedicine).  Patients are able to view lab/test results, encounter notes, upcoming appointments, etc.  Non-urgent messages can be sent to your provider as well.   To learn more about what you can do with MyChart, go to [email or web address].    Your next appointment:   6 month(s)  The format for your next appointment:   In Person  Provider:   [name], MD   Other Instructions

## 2020-05-27 NOTE — Progress Notes (Signed)
Cardiology Office Note Date:  05/27/2020  Patient ID:  [name] Cook, [name] Cook [birth year], MRN [id number] PCP:  [name], NP  Cardiologist:  [name]    Chief Complaint:   6 mo follow up  History of Present Illness: [name] Cook is a 70 y.o. male with history of AFlutter (ablated) >> persistent AFib >> permanent uncontrolled AFib >> AVNode ablation/PPM, HTN, HLD, DM, COPD, chronic combined CHF, CAD (NSTEMI 2014 w/occ branch of Diag treated medically >> 2016 unstable angina had DES to RCA).   He comes in today to be seen for [name].  Last seen by him [name] 2020, he was admitted to [facility] w/CP feb with stable CAD by cath.  I saw him 05/28/19:  he initially reported feeling well and had no complaints or symptoms of concern to him, though the longer we talked he mentiond a CP that he describes as a pinch, located to a couple different spots on his chest, inferior to L breast ad high sternum, no associated symptoms, no radiation, and random, not associated with exertion or position, duration is hard to nail down, maybe a couple minutes, just self resolves, he does not use s/l NTG for it.  Infrequent and thinks he has had it on/off for a year or so. He also described DOE.  He reported able to do work around the house, he gives an example of currently cleaning out his shed and lifting/carrying things, organizing without symptoms, but if he walks an incline he gets very winded has to stop to regain himself.  He will sometimes get winded walking from the car to the house, though this was much less and not typical for him, can do the same walk and more without symptoms as well. This symptoms had been about the same for a couple years He denied any Palpitations, no near syncope or syncope. He reports his PMD generally does labs Q 29mo for lipids and routine labs. They are managing hs warfarin He denies any bleeding or signs of bleeding  Symptoms felt atypical, though stable major epicardial CAD, known  significant branch vessel disease could not r/o ? Anginal equivalent.  His Imdur was increased, lisinopril reduced.  Recommended to start walking at pace that did not cause symptoms to start some cardiac conditioning.Marland Kitchen  He was not felt to be volume OL, planned for labs and f/u Labs were OK.  I saw him 07/24/19 in f/u He has tolerated the medicine changes, feels like he is doing well. No notable DOE that he noted.  No CP, palpitations.  No dizzy spells, near syncope or syncope. He has started walking some for exercise, continues to work on his shed, and denies any concerns. Unfortunately he sufferers with neuropathy particularly his R foot and can not walk to far or too long.  Burning pain to the bottom of his foot, no calf/muscular pain, nothing sounding of claudication He twisted his ankle a couple weeks ago and this has been slow to heal, reports xray done without fracture. No changes were made., planned for 102mo f/u.  He had an ER visit 01/24/2020 for LLQ/flank pain and some degree of SOB/cough.  Not found to be volume OL with low suspicion of PE given warfarin, or ACS, found with acute uncomplicated diverticulitis and start ed on Abx  I saw him 02/03/20 He denies any CP or palpitations, no cardiac awareness, no near syncope or syncope. He will occassionally feel a fullness in his ears/head, no headaches. Generally  has no energy, tired all of the time. He has some occ SOB that is baseline and unchanged No bleeding or signs of bleeding Reports his PMD manages his INR/warfarin and lipids. He was hypotensive (asymptomatic) 80's-90s/50's His lisinopril was reduced  COVID + 04/12/20.  TODAY He is doing well.  Recovered well from his COVID infection and reports fairly minimal symptoms. Denies any CP, palpitations or cardiac awareness. NO near syncope or syncope, occasionally will feel slightly lightheaded upon standing. His home cuff broke and has been unable to keep track of his home BPs. Asks  if a BP cuff would be covered by his insurance. He inquires about a letter to excuse him from [place] duty. Denies any changes to his baseline DOE with his COPD. 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 Arthritis    "knees and lower back" (03/14/2013)  . Atrial flutter ([place])    radiofrequency ablation in 2001  . CAD (coronary artery disease)    a. Nonobstructive. Cardiac cath in 2001-50% mid RI, normal LM, LAD, RCA b. cath 10/16/2014 95% mid RCA treated with DES, 99% ost D1 medical management due to small aneurysmal segment  . Chronic anticoagulation    chronic Coumadin anticoagulation  . Chronic obstructive pulmonary disease ([place]) 04/20/2011  . Diabetes mellitus, type 2 ([place])   . GERD (gastroesophageal reflux disease)   . Hyperlipidemia   . Hypertension    with hypertensive heart disease  . Left knee pain 10/25/2017   medial  . Obesity   . Persistent atrial fibrillation (H[facility])    recurrent atrial flutter since 2001 s/p DCCVs, multiple failed AADs, h/o tachy-mediated cardiomyopathy  . Shortness of breath    "can come on at any time" (03/14/2013)  . Sleep apnea    "dx'd; couldn't wear the mask" (03/14/2013)  . Tobacco abuse     Past Surgical History:  Procedure Laterality Date  . ATRIAL FLUTTER ABLATION  2002   atrial flutter; subsequently developed atrial fibrillation  . AV NODE ABLATION  01/24/2013  . CARDIAC CATHETERIZATION  2002  . CARDIAC CATHETERIZATION N/A 10/16/2014   Procedure: Left Heart Cath and Coronary Angiography;  Surgeon: [name], MD;  Location: [facility];  Service: Cardiovascular;  Laterality: N/A;  . CARDIOVERSION  05/31/2011   Procedure: CARDIOVERSION;  Surgeon: Cristopher Estimable. Lattie Haw, MD;   Location: [facility];  Service: Cardiovascular;  Laterality: N/A;  . CARPAL TUNNEL RELEASE Left 1980's  . COLONOSCOPY WITH PROPOFOL N/A 12/30/2018   Procedure: COLONOSCOPY WITH PROPOFOL;  Surgeon: Jonathon Bellows, MD;  Location: [facility] ENDOSCOPY;  Service: Gastroenterology;  Laterality: N/A;  . COLONOSCOPY WITH PROPOFOL N/A 12/04/2019   Procedure: COLONOSCOPY WITH PROPOFOL;  Surgeon: Jonathon Bellows, MD;  Location: [facility] ENDOSCOPY;  Service: Gastroenterology;  Laterality: N/A;  . INSERT / REPLACE / REMOVE PACEMAKER  01/24/2013    Medtronic Adapta L dual-chamber pacemaker, serial number [id number]   . LEFT HEART CATH AND CORONARY ANGIOGRAPHY N/A 06/07/2018   Procedure: LEFT HEART CATH AND CORONARY ANGIOGRAPHY;  Surgeon: [name], MD;  Location: [facility];  Service: Cardiovascular;  Laterality: N/A;  . LEFT HEART CATHETERIZATION WITH CORONARY ANGIOGRAM N/A 03/17/2013   Procedure: LEFT HEART CATHETERIZATION WITH CORONARY ANGIOGRAM;  Surgeon:  Burnell Blanks, MD; LAD mild dz, D1 branch 100%, inferior branch 99%, CFX OK, RCA 50%, EF 65%    . LOOP RECORDER IMPLANT  2002  . PERMANENT PACEMAKER INSERTION N/A 01/24/2013   Procedure: PERMANENT PACEMAKER INSERTION;  Surgeon: [name], MD;  Location: [facility] CATH LAB;  Service: Cardiovascular;  Laterality: N/A;  . TIBIAL TUBERCLERPLASTY  ~ 2003    Current Outpatient Medications  Medication Sig Dispense Refill  . albuterol (VENTOLIN HFA) 108 (90 Base) MCG/ACT inhaler Inhale 2 puffs into the lungs every 4 (four) hours as needed for wheezing or shortness of breath. 1 each 0  . budesonide-formoterol (SYMBICORT) 160-4.5 MCG/ACT inhaler Inhale 2 puffs into the lungs 2 (two) times daily. 1 each 3  . citalopram (CELEXA) 40 MG tablet TAKE 1 TABLET (40 MG TOTAL) BY MOUTH DAILY. FOR DEPRESSION. 90 tablet 0  . clopidogrel (PLAVIX) 75 MG tablet TAKE 1 TABLET BY MOUTH EVERY DAY 90 tablet 3  . diclofenac Sodium (VOLTAREN) 1 % GEL Apply 2 g topically 4 (four)  times daily. 50 g 0  . fish oil-omega-3 fatty acids 1000 MG capsule Take 1 g by mouth daily.      . fluticasone (FLONASE) 50 MCG/ACT nasal spray PLACE 1 SPRAY INTO BOTH NOSTRILS DAILY AS NEEDED FOR ALLERGIES OR RHINITIS. 48 mL 3  . furosemide (LASIX) 20 MG tablet Take 1 tablet (20 mg total) by mouth daily. 90 tablet 3  . gabapentin (NEURONTIN) 300 MG capsule Take 1 capsule (300 mg total) by mouth 3 (three) times daily. 270 capsule 0  . glucose blood (ONE TOUCH ULTRA TEST) test strip USE TO TEST UP TO 4 TIMES DAILY 100 each 2  . guaiFENesin-codeine 100-10 MG/5ML syrup Take 5 mLs by mouth 3 (three) times daily as needed for cough. Caution of sedation 80 mL 0  . isosorbide mononitrate (IMDUR) 60 MG 24 hr tablet TAKE 1 TABLET BY MOUTH EVERY DAY 90 tablet 2  . JARDIANCE 10 MG TABS tablet TAKE 1 TABLET BY MOUTH EVERY MORNING FOR DIABETES 90 tablet 1  . lisinopril (ZESTRIL) 2.5 MG tablet Take 1 tablet (2.5 mg total) by mouth daily. 90 tablet 1  . metFORMIN (GLUCOPHAGE) 500 MG tablet TAKE 1 TABLET BY MOUTH TWICE DAILY WITH A MEAL 180 tablet 1  . metoprolol succinate (TOPROL-XL) 25 MG 24 hr tablet TAKE 1 TABLET BY MOUTH EVERY DAY 90 tablet 3  . nitroGLYCERIN (NITROSTAT) 0.4 MG SL tablet Place 1 tablet (0.4 mg total) under the tongue every 5 (five) minutes as needed for chest pain. 25 tablet 0  . pantoprazole (PROTONIX) 40 MG tablet TAKE 1 TABLET (40 MG TOTAL) BY MOUTH DAILY FOR HEARTBURN. 90 tablet 1  . rosuvastatin (CRESTOR) 20 MG tablet TAKE 1 TABLET BY MOUTH EVERY DAY IN THE EVENING FOR CHOLESTEROL 90 tablet 1  . sitaGLIPtin (JANUVIA) 100 MG tablet TAKE 1 TABLET BY MOUTH ONCE DAILY FOR DIABETES. 90 tablet 1  . tamsulosin (FLOMAX) 0.4 MG CAPS capsule TAKE 1 CAPSULE (0.4 MG TOTAL) BY MOUTH 2 (TWO) TIMES DAILY. 180 capsule 3  . warfarin (COUMADIN) 5 MG tablet TAKE 1/2 TABLET EVERY DAY OTHER THAN MONDAYS. TAKE 1 FULL TABLET BY MOUTH ON MONDAYS 48 tablet 1   No current facility-administered medications for  this visit.    Allergies:   Patient has no known allergies.   Social History:  The patient  reports that he quit smoking about 8 years ago. His smoking use included cigarettes. He has a 42.00  pack-year smoking history. He has never used smokeless tobacco. He reports previous alcohol use. He reports that he does not use drugs.   Family History:  The patient's family history includes Alzheimer's disease in his mother; Osteoporosis in his mother.  ROS:  Please see the history of present illness.  All other systems are reviewed and otherwise negative.   PHYSICAL EXAM:  VS:  There were no vitals taken for this visit. BMI: There is no height or weight on file to calculate BMI. Well nourished, well developed, in no acute distress  HEENT: normocephalic, atraumatic  Neck: no JVD, carotid bruits or masses Cardiac:  RRR; (paced) no significant murmurs, no rubs, or gallops Lungs:  CTA b/l, no wheezing, rhonchi or rales  Abd: soft, nontender, obese MS: no deformity or atrophy Ext:  no edema or skin changes noted. Skin: warm and dry, no rash Neuro:  No gross deficits appreciated Psych: euthymic mood, full affect  PPM site is stable, no tethering or discomfort   EKG:  Not done today  01/24/2020 personally reviewed: AF, V paced   PPM interrogation done today and reviewed by myself:  Battery and lead testing are good, no HVR  Cardiac catheterization 06/07/2018 LM irregularities LAD mild diffuse disease; D1 ostial 99, superior branch occluded/larger inferior branch 99 -  unchanged from 2014 and 2016 LCx mild diffuse disease RCA proximal 40, mid stent patent, 30   Echocardiogram 06/06/2018 EF 45-50, mild LVH, indeterminate diastolic function, anterior septal and inferior akinesis, normal RVSF, moderate LAE, mild aortic valve calcification  Echocardiogram 10/16/2014 Moderate LVH, anterior and periapical hypokinesis, EF 35-40, trivial MR, mild LAE, mild RAE    Recent Labs: 01/23/2020:  ALT 19; B Natriuretic Peptide 70.1; BUN 17; Creatinine, Ser 1.12; Hemoglobin 15.6; Platelets 128; Potassium 3.7; Sodium 138  10/07/2019: Cholesterol 145; HDL 45.80; LDL Cholesterol 68; Total CHOL/HDL Ratio 3; Triglycerides 156.0; VLDL 31.2   CrCl cannot be calculated (Patient's most recent lab result is older than the maximum 21 days allowed.).   Wt Readings from Last 3 Encounters:  05/05/20 228 lb (103.4 kg)  02/06/20 235 lb (106.6 kg)  02/03/20 231 lb (104.8 kg)     Other studies reviewed: Additional studies/records reviewed today include: summarized above  ASSESSMENT AND PLAN:  1. PPM     Intact function, no changes made  2. Permanent Afib     S/p AV node ablation     CHA2DS2Vasc is, 5 on warfarin, monitored and managed by his PMD  3. CAD     Remains resolved, has known branch vessel disease and patent stent by cath Feb 2020r      On plavix, nitrate, BB, statin  4. Chronic CHF (combined)     LVEF by evaluation Feb 2020 was 45-50%     Exam does not suggest volume OL     His DOE is unchanged for a couple years, known COPD     On BB/ACE, furosemide    5. HTN     Relative hypotension     Mild orthostatic symptoms occasionally     He has had some hx of CP in the past and would like to avoid hanging his nitrate     Will cut his Toprol back      I wrote an Rx tfor BP cuff to see if his insurance will cover it.   6. HLD     Monitored and managed with his PMD   Disposition: I have reached out to [name] (staff  message) regarding jury duty excuse, otherwise will see him back in 57mo again, sooner if needed    Current medicines are reviewed at length with the patient today.  The patient did not have any concerns regarding medicines.  Venetia Night, PA-C 05/27/2020 6:07 AM     [address] [place] [address] [phone] (office)  [phone] (fax)

## 2020-06-01 ENCOUNTER — Ambulatory Visit (INDEPENDENT_AMBULATORY_CARE_PROVIDER_SITE_OTHER): Payer: Medicare Other

## 2020-06-01 ENCOUNTER — Other Ambulatory Visit: Payer: Self-pay

## 2020-06-01 DIAGNOSIS — Z7901 Long term (current) use of anticoagulants: Secondary | ICD-10-CM | POA: Diagnosis not present

## 2020-06-01 LAB — POCT INR: INR: 2.4 (ref 2.0–3.0)

## 2020-06-01 NOTE — Patient Instructions (Addendum)
Pre visit review using our clinic review tool, if applicable. No additional management support is needed unless otherwise documented below in the visit note.  Continue taking 2.5mg  daily except take 5mg  on Monday and Thursdays . Recheck in 4 wks.

## 2020-06-02 ENCOUNTER — Telehealth: Payer: Self-pay | Admitting: *Deleted

## 2020-06-02 NOTE — Telephone Encounter (Signed)
-----   Message from [facility] [place] sent at 06/02/2020  6:36 AM EST ----- Would you mind please, letting him know that I reached out to [name], and at least from a cardiac perspective have no reason to excuse him  from serving jury duty.  Renee

## 2020-06-02 NOTE — Telephone Encounter (Signed)
Lvm Per Tommye Standard PA-C  From [name] stated , and at least from a cardiac perspective have no reason to excuse him from serving jury duty.

## 2020-06-03 ENCOUNTER — Ambulatory Visit (INDEPENDENT_AMBULATORY_CARE_PROVIDER_SITE_OTHER): Payer: Medicare Other

## 2020-06-03 DIAGNOSIS — I442 Atrioventricular block, complete: Secondary | ICD-10-CM

## 2020-06-03 LAB — CUP PACEART REMOTE DEVICE CHECK
Battery Impedance: 670 Ohm
Battery Remaining Longevity: 82 mo
Battery Voltage: 2.79 V
Brady Statistic RV Percent Paced: 100 %
Date Time Interrogation Session: 20220216221736
Implantable Lead Implant Date: 20141010
Implantable Lead Implant Date: 20141010
Implantable Lead Location: 753859
Implantable Lead Location: 753860
Implantable Lead Model: 5076
Implantable Lead Model: 5076
Implantable Pulse Generator Implant Date: 20141010
Lead Channel Impedance Value: 657 Ohm
Lead Channel Impedance Value: 67 Ohm
Lead Channel Pacing Threshold Amplitude: 0.75 V
Lead Channel Pacing Threshold Pulse Width: 0.4 ms
Lead Channel Setting Pacing Amplitude: 2.5 V
Lead Channel Setting Pacing Pulse Width: 0.4 ms
Lead Channel Setting Sensing Sensitivity: 4 mV

## 2020-06-08 ENCOUNTER — Other Ambulatory Visit: Payer: Self-pay

## 2020-06-08 ENCOUNTER — Other Ambulatory Visit: Payer: Medicare Other

## 2020-06-08 ENCOUNTER — Encounter: Payer: Self-pay | Admitting: Urology

## 2020-06-08 DIAGNOSIS — C61 Malignant neoplasm of prostate: Secondary | ICD-10-CM

## 2020-06-09 LAB — PSA: Prostate Specific Ag, Serum: <0.1 ng/mL (ref 0.0–4.0)

## 2020-06-09 NOTE — Progress Notes (Signed)
Remote pacemaker transmission.   

## 2020-06-15 ENCOUNTER — Other Ambulatory Visit: Payer: Self-pay

## 2020-06-15 ENCOUNTER — Ambulatory Visit (INDEPENDENT_AMBULATORY_CARE_PROVIDER_SITE_OTHER): Payer: Medicare Other | Admitting: Urology

## 2020-06-15 ENCOUNTER — Telehealth: Payer: Self-pay

## 2020-06-15 ENCOUNTER — Other Ambulatory Visit: Payer: Self-pay | Admitting: Primary Care

## 2020-06-15 VITALS — BP 120/72 | HR 83 | Ht 71.0 in | Wt 228.0 lb

## 2020-06-15 DIAGNOSIS — N529 Male erectile dysfunction, unspecified: Secondary | ICD-10-CM | POA: Diagnosis not present

## 2020-06-15 DIAGNOSIS — C61 Malignant neoplasm of prostate: Secondary | ICD-10-CM | POA: Diagnosis not present

## 2020-06-15 DIAGNOSIS — N400 Enlarged prostate without lower urinary tract symptoms: Secondary | ICD-10-CM

## 2020-06-15 DIAGNOSIS — Z7901 Long term (current) use of anticoagulants: Secondary | ICD-10-CM

## 2020-06-15 DIAGNOSIS — I4819 Other persistent atrial fibrillation: Secondary | ICD-10-CM

## 2020-06-15 NOTE — Telephone Encounter (Signed)
Contacted pt and advised new financial statements will be needed for the pt assistance for his knee injections. He will bring them tomorrow morning so they can be faxed.

## 2020-06-15 NOTE — Progress Notes (Signed)
06/15/2020 11:38 AM   [name] March 13, 1951 [id number]  Referring provider: [name], NP [address] [place],  [place] [address]    HPI: 70 year old male with a personal history of prostate cancer and erectile dysfunction who returns today for follow-up.  He underwent prostate biopsy in 04/2018.  This revealed Prostate biopsy revealed 6/12 cores, all on the right including 3 cores of Gleason 3+3 (up to 99%) and 3 cores of 3+4 up to 96%.High PSA 6.1. Rectal exam was abnormal with a nodule at the right apex and firm on the side. TRUS vol 50 with small median lobe, irregular on right.  Ultimately, he elected treatment with EBRT and short-term Lupron.  He received his dose in 06/2018.  He completed radiation in 08/2018.  PSA remains undetectable as of 06/08/2020.  He also has a history of erectile dysfunction following radiation.  At the time of evaluation for this particular issue, he was on Imdur not a candidate for PDE 5 inhibitors.  He was subsequently referred for consideration of penile prosthesis.  He reports that he saw Dr. At [facility] urology 1 time (cannot recall the name of the surgeon) but then got sick with Covid and never followed up.  He continues to be interested in penile prosthesis versus injection therapy.  His erectile dysfunction continues to be severe, with inability to maintain and achieve erections.  He has no urinary complaints today.  He is on Flomax twice daily as prescribed by Dr. Baruch Gouty.   PMH: Past Medical History:  Diagnosis Date  . Arthritis    "knees and lower back" (03/14/2013)  . Atrial flutter ([place])    radiofrequency ablation in 2001  . CAD (coronary artery disease)    a. Nonobstructive. Cardiac cath in 2001-50% mid RI, normal LM, LAD, RCA b. cath 10/16/2014 95% mid RCA treated with DES, 99% ost D1 medical management due to small aneurysmal segment  . Chronic anticoagulation    chronic Coumadin anticoagulation  . Chronic obstructive  pulmonary disease ([place]) 04/20/2011  . Diabetes mellitus, type 2 ([place])   . GERD (gastroesophageal reflux disease)   . Hyperlipidemia   . Hypertension    with hypertensive heart disease  . Left knee pain 10/25/2017   medial  . Obesity   . Persistent atrial fibrillation ([facility])    recurrent atrial flutter since 2001 s/p DCCVs, multiple failed AADs, h/o tachy-mediated cardiomyopathy  . Shortness of breath    "can come on at any time" (03/14/2013)  . Sleep apnea    "dx'd; couldn't wear the mask" (03/14/2013)  . Tobacco abuse     Surgical History: Past Surgical History:  Procedure Laterality Date  . ATRIAL FLUTTER ABLATION  2002   atrial flutter; subsequently developed atrial fibrillation  . AV NODE ABLATION  01/24/2013  . CARDIAC CATHETERIZATION  2002  . CARDIAC CATHETERIZATION N/A 10/16/2014   Procedure: Left Heart Cath and Coronary Angiography;  Surgeon: [name], MD;  Location: [place] CV LAB;  Service: Cardiovascular;  Laterality: N/A;  . CARDIOVERSION  05/31/2011   Procedure: CARDIOVERSION;  Surgeon: Cristopher Estimable. Lattie Haw, MD;  Location: [facility];  Service: Cardiovascular;  Laterality: N/A;  . CARPAL TUNNEL RELEASE Left 1980's  . COLONOSCOPY WITH PROPOFOL N/A 12/30/2018   Procedure: COLONOSCOPY WITH PROPOFOL;  Surgeon: Jonathon Bellows, MD;  Location: [facility] ENDOSCOPY;  Service: Gastroenterology;  Laterality: N/A;  . COLONOSCOPY WITH PROPOFOL N/A 12/04/2019   Procedure: COLONOSCOPY WITH PROPOFOL;  Surgeon: Jonathon Bellows, MD;  Location: [facility]  ENDOSCOPY;  Service: Gastroenterology;  Laterality: N/A;  . INSERT / REPLACE / REMOVE PACEMAKER  01/24/2013    Medtronic Adapta L dual-chamber pacemaker, serial number [id number]   . LEFT HEART CATH AND CORONARY ANGIOGRAPHY N/A 06/07/2018   Procedure: LEFT HEART CATH AND CORONARY ANGIOGRAPHY;  Surgeon: [name], MD;  Location: [place] CV LAB;  Service: Cardiovascular;  Laterality: N/A;  . LEFT HEART CATHETERIZATION WITH CORONARY ANGIOGRAM N/A  03/17/2013   Procedure: LEFT HEART CATHETERIZATION WITH CORONARY ANGIOGRAM;  Surgeon: Burnell Blanks, MD; LAD mild dz, D1 branch 100%, inferior branch 99%, CFX OK, RCA 50%, EF 65%    . LOOP RECORDER IMPLANT  2002  . PERMANENT PACEMAKER INSERTION N/A 01/24/2013   Procedure: PERMANENT PACEMAKER INSERTION;  Surgeon: [name], MD;  Location: [facility] C[facility] LAB;  Service: Cardiovascular;  Laterality: N/A;  . TIBIAL TUBERCLERPLASTY  ~ 2003    Home Medications:  Allergies as of 06/15/2020   No Known Allergies     Medication List       Accurate as of June 15, 2020 11:38 AM. If you have any questions, ask your nurse or doctor.        STOP taking these medications   guaiFENesin-codeine 100-10 MG/5ML syrup Stopped by: Hollice Espy, MD     TAKE these medications   albuterol 108 (90 Base) MCG/ACT inhaler Commonly known as: VENTOLIN HFA Inhale 2 puffs into the lungs every 4 (four) hours as needed for wheezing or shortness of breath.   budesonide-formoterol 160-4.5 MCG/ACT inhaler Commonly known as: SYMBICORT Inhale 2 puffs into the lungs 2 (two) times daily.   citalopram 40 MG tablet Commonly known as: CELEXA TAKE 1 TABLET (40 MG TOTAL) BY MOUTH DAILY. FOR DEPRESSION.   clopidogrel 75 MG tablet Commonly known as: PLAVIX TAKE 1 TABLET BY MOUTH EVERY DAY   diclofenac Sodium 1 % Gel Commonly known as: Voltaren Apply 2 g topically 4 (four) times daily.   fish oil-omega-3 fatty acids 1000 MG capsule Take 1 g by mouth daily.   fluticasone 50 MCG/ACT nasal spray Commonly known as: FLONASE PLACE 1 SPRAY INTO BOTH NOSTRILS DAILY AS NEEDED FOR ALLERGIES OR RHINITIS.   furosemide 20 MG tablet Commonly known as: LASIX Take 1 tablet (20 mg total) by mouth daily.   gabapentin 300 MG capsule Commonly known as: NEURONTIN Take 1 capsule (300 mg total) by mouth 3 (three) times daily.   glucose blood test strip Commonly known as: ONE TOUCH ULTRA TEST USE TO TEST UP TO 4 TIMES  DAILY   isosorbide mononitrate 60 MG 24 hr tablet Commonly known as: IMDUR TAKE 1 TABLET BY MOUTH EVERY DAY   Jardiance 10 MG Tabs tablet Generic drug: empagliflozin TAKE 1 TABLET BY MOUTH EVERY MORNING FOR DIABETES   lisinopril 2.5 MG tablet Commonly known as: ZESTRIL Take 1 tablet (2.5 mg total) by mouth daily.   metFORMIN 500 MG tablet Commonly known as: GLUCOPHAGE TAKE 1 TABLET BY MOUTH TWICE DAILY WITH A MEAL   metoprolol succinate 25 MG 24 hr tablet Commonly known as: TOPROL-XL Take 0.5 tablets (12.5 mg total) by mouth daily.   nitroGLYCERIN 0.4 MG SL tablet Commonly known as: NITROSTAT Place 1 tablet (0.4 mg total) under the tongue every 5 (five) minutes as needed for chest pain.   pantoprazole 40 MG tablet Commonly known as: PROTONIX TAKE 1 TABLET (40 MG TOTAL) BY MOUTH DAILY FOR HEARTBURN.   rosuvastatin 20 MG tablet Commonly known as: CRESTOR TAKE 1 TABLET  BY MOUTH EVERY DAY IN THE EVENING FOR CHOLESTEROL   sitaGLIPtin 100 MG tablet Commonly known as: Januvia TAKE 1 TABLET BY MOUTH ONCE DAILY FOR DIABETES.   tamsulosin 0.4 MG Caps capsule Commonly known as: FLOMAX TAKE 1 CAPSULE (0.4 MG TOTAL) BY MOUTH 2 (TWO) TIMES DAILY.   warfarin 5 MG tablet Commonly known as: COUMADIN Take as directed by the anticoagulation clinic. If you are unsure how to take this medication, talk to your nurse or doctor. Original instructions: TAKE BY MOUTH 1/2 TABLET EVERY DAY EXCEPT TAKE 1 TABLET ON MONDAYS AND THURSDAYS OR AS INSTRUCTED BY COUMADIN CLINIC. What changed: additional instructions Changed by: [name], NP       Allergies: No Known Allergies  Family History: Family History  Problem Relation Age of Onset  . Alzheimer's disease Mother   . Osteoporosis Mother     Social History:  reports that he quit smoking about 8 years ago. His smoking use included cigarettes. He has a 42.00 pack-year smoking history. He has never used smokeless tobacco. He reports  previous alcohol use. He reports that he does not use drugs.   Physical Exam: BP 120/72   Pulse 83   Ht 5\' 11"  (1.803 m)   Wt 228 lb (103.4 kg)   BMI 31.80 kg/m   Constitutional:  Alert and oriented, No acute distress. HEENT: [place] AT, moist mucus membranes.  Trachea midline, no masses. Cardiovascular: No clubbing, cyanosis, or edema. Respiratory: Normal respiratory effort, no increased work of breathing. Skin: No rashes, bruises or suspicious lesions. Neurologic: Grossly intact, no focal deficits, moving all 4 extremities. Psychiatric: Normal mood and affect.   Assessment & Plan:    1. Prostate cancer ([place]) No evidence of disease, recommend PSA annually  2. Erectile dysfunction of organic origin Lengthy discussion today again about management of severe erectile dysfunction  Given his inability to take PDE 5 inhibitors and is currently on Imdur, options including intra cavernosal injections versus prostheses were again reviewed in detail.  He will plan on following up with his [facility] at [facility] to discuss prosthesis further.  He additional questions which I was able to answer today about the prosthesis.  As an alternative, we discussed intracavernosal injections.  He will think about [place] and let us know if he would like to pursue this.  If he does, will call us and we will schedule injection teaching with titration.  All questions answered.  3. Benign prostatic hyperplasia without lower urinary tract symptoms Currently on Flomax twice daily, may titrate off of his symptoms are well controlled  F/u 1 year with PSA or sooner as needed  Hollice Espy, MD  [place] [address], [place], [place] [address] [phone]

## 2020-06-15 NOTE — Telephone Encounter (Signed)
Pt reported at last coumadin clinic apt that he thinks it is time for his next knee inj. Advised this nurse would f/u on it and get back with him. Last inj was paid for with pt assistance program. Last bilateral knee injs were on 12/15/19 and was Durolane injections. Pt is due for another inj after 06/14/20.  Pt assistance program was contacted by this office and info has to be resubmitted every 6 months when a new inj is needed.  Need new financials from pt LVM for pt to contact office. Please route call this nurse.

## 2020-06-15 NOTE — Telephone Encounter (Signed)
LVM

## 2020-06-15 NOTE — Telephone Encounter (Signed)
[place], it looks like the Rx has changed (old Rx) to 2.5 mg daily except for Monday AND Thursday, is that correct?

## 2020-06-15 NOTE — Telephone Encounter (Signed)
Pt is compliant with coumadin management. Sent in refill.  That is correct. Currently pt is taking 2.5mg  daily except on Mon and Thurs he is taking 5mg . I have sent in the refill.

## 2020-06-16 NOTE — Telephone Encounter (Signed)
Filled out BV360 form and prescription and Dr. Lorelei Pont signed both. Faxed both with financials to [phone]

## 2020-06-16 NOTE — Telephone Encounter (Signed)
Received financials. Will fax with paperwork for pt assistance.

## 2020-06-18 NOTE — Telephone Encounter (Signed)
Received approval for patient to have Durolane injections.  I have ordered 2 doses (1 for each knee) for bilateral knee injections and should be delivered on Monday 06/21/20 and ready for his appointment on Wednesday 06/23/20.  After running benefits, it has been determined that his insurance will cover at 100% (out of pocket and deductible do not apply).   I have contacted patient via phone to let him know.

## 2020-06-23 ENCOUNTER — Ambulatory Visit (INDEPENDENT_AMBULATORY_CARE_PROVIDER_SITE_OTHER): Payer: Medicare Other | Admitting: Family Medicine

## 2020-06-23 ENCOUNTER — Other Ambulatory Visit: Payer: Self-pay

## 2020-06-23 ENCOUNTER — Encounter: Payer: Self-pay | Admitting: Family Medicine

## 2020-06-23 VITALS — BP 110/70 | HR 84 | Temp 97.4°F | Ht 71.0 in | Wt 235.5 lb

## 2020-06-23 DIAGNOSIS — M170 Bilateral primary osteoarthritis of knee: Secondary | ICD-10-CM

## 2020-06-23 MED ORDER — SODIUM HYALURONATE 60 MG/3ML IX PRSY
60.0000 mg | PREFILLED_SYRINGE | Freq: Once | INTRA_ARTICULAR | Status: AC
  Administered 2020-06-23: 60 mg via INTRA_ARTICULAR

## 2020-06-23 NOTE — Progress Notes (Signed)
    Tavaria Mackins T. Rashaunda Rahl, MD, [facility]  Primary Care and Sports Medicine [facility] at [facility] [place], [address]  Phone: [phone]  FAX: [phone]  [name] Cook - 69 y.o. male  MRN [id number]  Date of Birth: [birth year]  Date: 06/23/2020  PCP: [name], NP  Referral: [name], NP  Chief Complaint  Patient presents with  . Knee Pain    Bilateral Durolane Injections    This visit occurred during the SARS-CoV-2 public health emergency.  Safety protocols were in place, including screening questions prior to the visit, additional usage of staff PPE, and extensive cleaning of exam room while observing appropriate contact time as indicated for disinfecting solutions.    Aspiration/Injection Procedure Note [name] Cook 20-Apr-1950 Date of procedure: 06/23/2020  Procedure: Large Joint Aspiration / Injection of Knee for Viscosupplementation, R Medication: Durolane 60 mg / 3 mL Indications: Pain J7318, # 60 units  Procedure Details Patient verbally consented to procedure. Risks, benefits, and alternatives explained. Sterilely prepped with Chloraprep. Ethyl cholride used for anesthesia, then 7 cc of Lidocaine 1% used for anesthesia in the anterolateral position. Reprepped with Chloraprep.  Anteromedial approach used to inject joint without difficulty, injected with Durolane 60 mg/3 mL. No complications with procedure and tolerated well.  Aspiration/Injection Procedure Note [name] Cook Aug 03, 1950 Date of procedure: 06/23/2020  Procedure: Large Joint Aspiration / Injection of Knee for Viscosupplementation, L Medication: Durolane 60 mg / 3 mL Indications: Pain J7318, # 60 units    ICD-10-CM   1. Primary osteoarthritis of knees, bilateral  M17.0 Sodium Hyaluronate PRSY 60 mg    Sodium Hyaluronate PRSY 60 mg    Meds ordered this encounter  Medications  . Sodium Hyaluronate PRSY 60 mg  . Sodium Hyaluronate  PRSY 60 mg    Signed,  Electa Sterry T. Laurianne Floresca, MD   Procedure Details Patient verbally consented to procedure. Risks, benefits, and alternatives explained. Sterilely prepped with Chloraprep. Ethyl cholride used for anesthesia, then 7 cc of Lidocaine 1% used for anesthesia in the anterolateral position. Reprepped with Chloraprep.  Anteromedial approach used to inject joint without difficulty, injected with Durolane 60 mg/3 mL. No complications with procedure and tolerated well.

## 2020-06-25 ENCOUNTER — Other Ambulatory Visit: Payer: Self-pay | Admitting: Primary Care

## 2020-06-25 DIAGNOSIS — K219 Gastro-esophageal reflux disease without esophagitis: Secondary | ICD-10-CM

## 2020-06-25 NOTE — Telephone Encounter (Signed)
Patient due for CPE in June 2022, can we just change his April visit with me for CPE to have in June?

## 2020-06-25 NOTE — Telephone Encounter (Signed)
Called patient and scheduled for 10/07/20.

## 2020-06-28 ENCOUNTER — Telehealth: Payer: Self-pay

## 2020-06-28 NOTE — Telephone Encounter (Signed)
Pt left v/m for guaifenesin codeine cough med; I called and pt was not available and pts girl friend,[name] Cook (DPR signed) said pt had prod cough but not sure color of phlegm, no fever or chills but at times pt feels clammy; sinus pressure of face,S/T, runny nose and sinus congestion. [name] Cook said that pt had been sick just few days. [name] Cook said pt could schedule video visit if needed. Scheduled video visit with Gentry Fitz NP on 06/29/20 at 10:20. Pt will have vitals ready when CMA calls. UC & ED precautions given and pt voiced understanding. Sending note to Gentry Fitz NP and [facility] CMA.

## 2020-06-29 ENCOUNTER — Other Ambulatory Visit: Payer: Medicare Other

## 2020-06-29 ENCOUNTER — Encounter: Payer: Self-pay | Admitting: Primary Care

## 2020-06-29 ENCOUNTER — Other Ambulatory Visit: Payer: Self-pay

## 2020-06-29 ENCOUNTER — Telehealth (INDEPENDENT_AMBULATORY_CARE_PROVIDER_SITE_OTHER): Payer: Medicare Other | Admitting: Primary Care

## 2020-06-29 DIAGNOSIS — J439 Emphysema, unspecified: Secondary | ICD-10-CM | POA: Diagnosis not present

## 2020-06-29 DIAGNOSIS — R059 Cough, unspecified: Secondary | ICD-10-CM

## 2020-06-29 MED ORDER — GUAIFENESIN-CODEINE 100-10 MG/5ML PO SYRP: 5.0000 mL | ORAL_SOLUTION | Freq: Three times a day (TID) | ORAL | 0 refills | Status: DC | PRN

## 2020-06-29 MED ORDER — PREDNISONE 20 MG PO TABS: ORAL_TABLET | ORAL | 0 refills | Status: DC

## 2020-06-29 NOTE — Patient Instructions (Signed)
Please proceed to our office this afternoon for Covid-19 testing.  You may take the cough suppressant every 8 hours as needed for cough and rest. Caution this medication contains codeine which may cause drowsiness.   Start prednisone 20 mg tablets. Take 2 tablets daily for 5 days.  Please call me Friday this week if no improvement in symptoms.  It was a pleasure to see you today! Allie Bossier, NP-C

## 2020-06-29 NOTE — Telephone Encounter (Signed)
Noted, will evaluate. 

## 2020-06-29 NOTE — Assessment & Plan Note (Signed)
One week history of either COPD exacerbation vs allergy vs viral symptoms. No cough or distress noted via phone.  Discussed to use albuterol inhaler PRN SOB. Rx for prednisone burst sent to pharmacy.  Rx for Cheratussin sent to pharmacy. Drowsiness precautions provided. He will proceed to our office for Covid testing.  Return precautions provided. He will call in a few days if no improvement. Consider chest xray at that point.

## 2020-06-29 NOTE — Progress Notes (Signed)
Virtual Visit via Video Note  I connected with [name] on 06/29/20 at 10:20 AM EDT by a video enabled telemedicine application and verified that I am speaking with the correct person using two identifiers.  Location: Patient: Home Provider: Office Participants: Patient and myself   I discussed the limitations of evaluation and management by telemedicine and the availability of in person appointments. The patient expressed understanding and agreed to proceed.  We attempted to connect via video but he could not make the video work, also didn't know how to check a text message. We conducted our visit via phone which lasted 5 min and 49 sec  History of Present Illness:  [name] is a 70 year old male with a history of hypertension CAD with MI, COPD, OSA, URI, CKD, prostate cancer, Covid-19 infection who presents today with a chief complaint of cough.  He also reports nasal congestion, increased shortness of breath, increased sputum production. He denies fevers. Symptoms began about one week ago. He's taken Mucinex and "tussin" with little improvement. Today he is feeling about the same as he did a few days ago.   He's compliant to his Symbicort inhaler twice daily, he's not used his albuterol inhaler.    Observations/Objective:  Alert and oriented. Appears well, not sickly. No distress. Speaking in complete sentences. No cough during visit.  Assessment and Plan:  One week history of either COPD exacerbation vs allergy vs viral symptoms. No cough or distress noted via phone.  Discussed to use albuterol inhaler PRN SOB. Rx for prednisone burst sent to pharmacy.  Rx for Cheratussin sent to pharmacy. Drowsiness precautions provided. He will proceed to our office for Covid testing.  Return precautions provided. He will call in a few days if no improvement. Consider chest xray at that point.  Follow Up Instructions:  Please proceed to our office this afternoon for Covid-19  testing.  You may take the cough suppressant every 8 hours as needed for cough and rest. Caution this medication contains codeine which may cause drowsiness.   Start prednisone 20 mg tablets. Take 2 tablets daily for 5 days.  Please call me Friday this week if no improvement in symptoms.  It was a pleasure to see you today! Allie Bossier, NP-C    I discussed the assessment and treatment plan with the patient. The patient was provided an opportunity to ask questions and all were answered. The patient agreed with the plan and demonstrated an understanding of the instructions.   The patient was advised to call back or seek an in-person evaluation if the symptoms worsen or if the condition fails to improve as anticipated.    [name], NP

## 2020-07-01 LAB — SPECIMEN STATUS REPORT

## 2020-07-01 LAB — NOVEL CORONAVIRUS, NAA: SARS-CoV-2, NAA: NOT DETECTED

## 2020-07-01 LAB — SARS-COV-2, NAA 2 DAY TAT

## 2020-07-06 ENCOUNTER — Ambulatory Visit (INDEPENDENT_AMBULATORY_CARE_PROVIDER_SITE_OTHER): Payer: Medicare Other

## 2020-07-06 DIAGNOSIS — Z7901 Long term (current) use of anticoagulants: Secondary | ICD-10-CM

## 2020-07-06 LAB — POCT INR: INR: 2.4 (ref 2.0–3.0)

## 2020-07-06 NOTE — Patient Instructions (Addendum)
Pre visit review using our clinic review tool, if applicable. No additional management support is needed unless otherwise documented below in the visit note.  Continue taking 2.5mg  daily except take 5mg  on Monday and Thursdays . Recheck in 4 wks.

## 2020-07-12 ENCOUNTER — Other Ambulatory Visit: Payer: Self-pay | Admitting: Primary Care

## 2020-07-12 DIAGNOSIS — E119 Type 2 diabetes mellitus without complications: Secondary | ICD-10-CM

## 2020-07-13 ENCOUNTER — Other Ambulatory Visit: Payer: Self-pay | Admitting: Physician Assistant

## 2020-07-19 ENCOUNTER — Ambulatory Visit: Payer: Medicare Other | Admitting: Primary Care

## 2020-07-21 ENCOUNTER — Other Ambulatory Visit: Payer: Self-pay | Admitting: Internal Medicine

## 2020-07-21 ENCOUNTER — Other Ambulatory Visit: Payer: Self-pay | Admitting: Primary Care

## 2020-07-21 ENCOUNTER — Other Ambulatory Visit: Payer: Self-pay | Admitting: Family Medicine

## 2020-07-21 DIAGNOSIS — Z7901 Long term (current) use of anticoagulants: Secondary | ICD-10-CM

## 2020-07-21 DIAGNOSIS — I200 Unstable angina: Secondary | ICD-10-CM

## 2020-07-21 DIAGNOSIS — F339 Major depressive disorder, recurrent, unspecified: Secondary | ICD-10-CM

## 2020-07-21 MED ORDER — NITROGLYCERIN 0.4 MG SL SUBL: 0.4000 mg | SUBLINGUAL_TABLET | SUBLINGUAL | 0 refills | Status: DC | PRN

## 2020-07-21 NOTE — Telephone Encounter (Signed)
Please advise on warfarin refill

## 2020-07-21 NOTE — Telephone Encounter (Signed)
Pharmacy requests refill on: Warfarin 5 mg   LAST REFILL: 06/15/2020 (Q-90, R-0) LAST OV: 07/06/2020 NEXT OV: 08/03/2020 PHARMACY: [id number] [place], [place] patient to verify if he was out of this medication. Patient should have enough for the next two months. Patient stated that he still had enough medication and he did not need any. Instructed patient to call back if he did. Patient verbalized understanding. Patient also reported that he needed a refill on his nitroglycerin. Sending to [place], CMA for refill.

## 2020-07-23 ENCOUNTER — Ambulatory Visit: Payer: Medicare Other | Admitting: Primary Care

## 2020-08-03 ENCOUNTER — Other Ambulatory Visit: Payer: Self-pay

## 2020-08-03 ENCOUNTER — Ambulatory Visit (INDEPENDENT_AMBULATORY_CARE_PROVIDER_SITE_OTHER): Payer: Medicare Other

## 2020-08-03 DIAGNOSIS — Z7901 Long term (current) use of anticoagulants: Secondary | ICD-10-CM

## 2020-08-03 LAB — POCT INR: INR: 1.7 — AB (ref 2.0–3.0)

## 2020-08-03 NOTE — Patient Instructions (Addendum)
Pre visit review using our clinic review tool, if applicable. No additional management support is needed unless otherwise documented below in the visit note.  Take 1/2 tablet when you get home since you have already taken the other half today and then continue taking 2.5mg  daily except take 5mg  on Monday and Thursdays . Recheck in 4 wks.

## 2020-08-11 ENCOUNTER — Other Ambulatory Visit: Payer: Self-pay | Admitting: Primary Care

## 2020-08-11 DIAGNOSIS — E785 Hyperlipidemia, unspecified: Secondary | ICD-10-CM

## 2020-08-11 DIAGNOSIS — E119 Type 2 diabetes mellitus without complications: Secondary | ICD-10-CM

## 2020-08-11 DIAGNOSIS — Z7901 Long term (current) use of anticoagulants: Secondary | ICD-10-CM

## 2020-08-28 LAB — HM DIABETES EYE EXAM

## 2020-09-02 ENCOUNTER — Other Ambulatory Visit: Payer: Self-pay

## 2020-09-02 ENCOUNTER — Ambulatory Visit (INDEPENDENT_AMBULATORY_CARE_PROVIDER_SITE_OTHER): Payer: Medicare Other

## 2020-09-02 DIAGNOSIS — Z7901 Long term (current) use of anticoagulants: Secondary | ICD-10-CM | POA: Diagnosis not present

## 2020-09-02 DIAGNOSIS — I442 Atrioventricular block, complete: Secondary | ICD-10-CM

## 2020-09-02 LAB — POCT INR: INR: 1.9 — AB (ref 2.0–3.0)

## 2020-09-02 NOTE — Patient Instructions (Addendum)
Pre visit review using our clinic review tool, if applicable. No additional management support is needed unless otherwise documented below in the visit note.  Take 1 tablet when you get home since you have already taken the other half today and then change your weekly dose to take 2.5mg  daily except take 5mg  on Mondays, Thursdays and Saturdays . Recheck in 4 wks.

## 2020-09-06 LAB — CUP PACEART REMOTE DEVICE CHECK
Battery Impedance: 746 Ohm
Battery Remaining Longevity: 77 mo
Battery Voltage: 2.79 V
Brady Statistic RV Percent Paced: 100 %
Date Time Interrogation Session: 20220520095258
Implantable Lead Implant Date: 20141010
Implantable Lead Implant Date: 20141010
Implantable Lead Location: 753859
Implantable Lead Location: 753860
Implantable Lead Model: 5076
Implantable Lead Model: 5076
Implantable Pulse Generator Implant Date: 20141010
Lead Channel Impedance Value: 609 Ohm
Lead Channel Impedance Value: 67 Ohm
Lead Channel Pacing Threshold Amplitude: 0.75 V
Lead Channel Pacing Threshold Pulse Width: 0.4 ms
Lead Channel Setting Pacing Amplitude: 2.5 V
Lead Channel Setting Pacing Pulse Width: 0.4 ms
Lead Channel Setting Sensing Sensitivity: 4 mV

## 2020-09-08 ENCOUNTER — Ambulatory Visit: Payer: Medicare Other | Admitting: Primary Care

## 2020-09-10 ENCOUNTER — Other Ambulatory Visit: Payer: Self-pay | Admitting: Primary Care

## 2020-09-10 DIAGNOSIS — J439 Emphysema, unspecified: Secondary | ICD-10-CM

## 2020-09-22 ENCOUNTER — Encounter: Payer: Self-pay | Admitting: Primary Care

## 2020-09-24 NOTE — Progress Notes (Signed)
Remote pacemaker transmission.   

## 2020-09-27 ENCOUNTER — Other Ambulatory Visit: Payer: Self-pay | Admitting: Primary Care

## 2020-09-27 ENCOUNTER — Other Ambulatory Visit: Payer: Self-pay | Admitting: Internal Medicine

## 2020-09-27 DIAGNOSIS — E1142 Type 2 diabetes mellitus with diabetic polyneuropathy: Secondary | ICD-10-CM

## 2020-09-27 DIAGNOSIS — Z7901 Long term (current) use of anticoagulants: Secondary | ICD-10-CM

## 2020-09-27 NOTE — Telephone Encounter (Signed)
Outpatient Medication Detail   Disp Refills Start End   metoprolol succinate (TOPROL-XL) 25 MG 24 hr tablet 45 tablet 3 05/27/2020    Sig - Route: Take 0.5 tablets (12.5 mg total) by mouth daily. - Oral   Sent to pharmacy as: metoprolol succinate (TOPROL-XL) 25 MG 24 hr tablet   E-Prescribing Status: Receipt confirmed by pharmacy (05/27/2020 10:47 AM EST)     Pharmacy  [id number] [place], [place]

## 2020-09-30 ENCOUNTER — Ambulatory Visit (INDEPENDENT_AMBULATORY_CARE_PROVIDER_SITE_OTHER): Payer: Medicare Other

## 2020-09-30 ENCOUNTER — Other Ambulatory Visit: Payer: Self-pay

## 2020-09-30 DIAGNOSIS — Z7901 Long term (current) use of anticoagulants: Secondary | ICD-10-CM

## 2020-09-30 LAB — POCT INR: INR: 2 (ref 2.0–3.0)

## 2020-09-30 NOTE — Patient Instructions (Addendum)
Pre visit review using our clinic review tool, if applicable. No additional management support is needed unless otherwise documented below in the visit note.  Continue taking 2.5mg  daily except take 5mg  on Mondays, Thursdays and Saturdays . Recheck in 4 wks.

## 2020-10-01 ENCOUNTER — Other Ambulatory Visit: Payer: Self-pay | Admitting: Primary Care

## 2020-10-01 DIAGNOSIS — E1142 Type 2 diabetes mellitus with diabetic polyneuropathy: Secondary | ICD-10-CM

## 2020-10-02 ENCOUNTER — Other Ambulatory Visit: Payer: Self-pay | Admitting: Primary Care

## 2020-10-02 DIAGNOSIS — E119 Type 2 diabetes mellitus without complications: Secondary | ICD-10-CM

## 2020-10-02 DIAGNOSIS — E785 Hyperlipidemia, unspecified: Secondary | ICD-10-CM

## 2020-10-06 ENCOUNTER — Other Ambulatory Visit: Payer: Self-pay

## 2020-10-06 ENCOUNTER — Ambulatory Visit (INDEPENDENT_AMBULATORY_CARE_PROVIDER_SITE_OTHER): Payer: Medicare Other

## 2020-10-06 DIAGNOSIS — Z122 Encounter for screening for malignant neoplasm of respiratory organs: Secondary | ICD-10-CM | POA: Diagnosis not present

## 2020-10-06 DIAGNOSIS — Z0000 Encounter for general adult medical examination without abnormal findings: Secondary | ICD-10-CM

## 2020-10-06 DIAGNOSIS — Z87891 Personal history of nicotine dependence: Secondary | ICD-10-CM

## 2020-10-06 NOTE — Patient Instructions (Signed)
[name] Cook , Thank you for taking time to come for your Medicare Wellness Visit. I appreciate your ongoing commitment to your health goals. Please review the following plan we discussed and let me know if I can assist you in the future.   Screening recommendations/referrals: Colonoscopy: due, bowel prep was poor on 12/04/2019 recommend repeat in 1 week. Patient aware and will call and schedule appointment.  Recommended yearly ophthalmology/optometry visit for glaucoma screening and checkup Recommended yearly dental visit for hygiene and checkup  Vaccinations: Influenza vaccine: Up to date, completed 01/30/2020, due 11/2020 Pneumococcal vaccine: due, will get at physical Tdap vaccine: Up to date, completed 01/05/2011, due 12/2020 Shingles vaccine: due, check with your insurance regarding coverage if interested    Covid-19: 2 vaccines completed. Booster due  Advanced directives: Advance directive discussed with you today. Even though you declined this today please call our office should you change your mind and we can give you the proper paperwork for you to fill out.  Conditions/risks identified: diabetes, hypertension, hyperlipidemia   Next appointment: Follow up in one year for your annual wellness visit.   Preventive Care 68 Years and Older, Male Preventive care refers to lifestyle choices and visits with your health care provider that can promote health and wellness. What does preventive care include? A yearly physical exam. This is also called an annual well check. Dental exams once or twice a year. Routine eye exams. Ask your health care provider how often you should have your eyes checked. Personal lifestyle choices, including: Daily care of your teeth and gums. Regular physical activity. Eating a healthy diet. Avoiding tobacco and drug use. Limiting alcohol use. Practicing safe sex. Taking low doses of aspirin every day. Taking vitamin and mineral supplements as recommended by  your health care provider. What happens during an annual well check? The services and screenings done by your health care provider during your annual well check will depend on your age, overall health, lifestyle risk factors, and family history of disease. Counseling  Your health care provider may ask you questions about your: Alcohol use. Tobacco use. Drug use. Emotional well-being. Home and relationship well-being. Sexual activity. Eating habits. History of falls. Memory and ability to understand (cognition). Work and work Statistician. Screening  You may have the following tests or measurements: Height, weight, and BMI. Blood pressure. Lipid and cholesterol levels. These may be checked every 5 years, or more frequently if you are over 109 years old. Skin check. Lung cancer screening. You may have this screening every year starting at age 29 if you have a 30-pack-year history of smoking and currently smoke or have quit within the past 15 years. Fecal occult blood test (FOBT) of the stool. You may have this test every year starting at age 64. Flexible sigmoidoscopy or colonoscopy. You may have a sigmoidoscopy every 5 years or a colonoscopy every 10 years starting at age 73. Prostate cancer screening. Recommendations will vary depending on your family history and other risks. Hepatitis C blood test. Hepatitis B blood test. Sexually transmitted disease (STD) testing. Diabetes screening. This is done by checking your blood sugar (glucose) after you have not eaten for a while (fasting). You may have this done every 1-3 years. Abdominal aortic aneurysm (AAA) screening. You may need this if you are a current or former smoker. Osteoporosis. You may be screened starting at age 82 if you are at high risk. Talk with your health care provider about your test results, treatment options, and if necessary,  the need for more tests. Vaccines  Your health care provider may recommend certain vaccines,  such as: Influenza vaccine. This is recommended every year. Tetanus, diphtheria, and acellular pertussis (Tdap, Td) vaccine. You may need a Td booster every 10 years. Zoster vaccine. You may need this after age 75. Pneumococcal 13-valent conjugate (PCV13) vaccine. One dose is recommended after age 70. Pneumococcal polysaccharide (PPSV23) vaccine. One dose is recommended after age 86. Talk to your health care provider about which screenings and vaccines you need and how often you need them. This information is not intended to replace advice given to you by your health care provider. Make sure you discuss any questions you have with your health care provider. Document Released: 04/30/2015 Document Revised: 12/22/2015 Document Reviewed: 02/02/2015 Elsevier Interactive Patient Education  2017 [employer] Prevention in the Home Falls can cause injuries. They can happen to people of all ages. There are many things you can do to make your home safe and to help prevent falls. What can I do on the outside of my home? Regularly fix the edges of walkways and driveways and fix any cracks. Remove anything that might make you trip as you walk through a door, such as a raised step or threshold. Trim any bushes or trees on the path to your home. Use bright outdoor lighting. Clear any walking paths of anything that might make someone trip, such as rocks or tools. Regularly check to see if handrails are loose or broken. Make sure that both sides of any steps have handrails. Any raised decks and porches should have guardrails on the edges. Have any leaves, snow, or ice cleared regularly. Use sand or salt on walking paths during winter. Clean up any spills in your garage right away. This includes oil or grease spills. What can I do in the bathroom? Use night lights. Install grab bars by the toilet and in the tub and shower. Do not use towel bars as grab bars. Use non-skid mats or decals in the tub or  shower. If you need to sit down in the shower, use a plastic, non-slip stool. Keep the floor dry. Clean up any water that spills on the floor as soon as it happens. Remove soap buildup in the tub or shower regularly. Attach bath mats securely with double-sided non-slip rug tape. Do not have throw rugs and other things on the floor that can make you trip. What can I do in the bedroom? Use night lights. Make sure that you have a light by your bed that is easy to reach. Do not use any sheets or blankets that are too big for your bed. They should not hang down onto the floor. Have a firm chair that has side arms. You can use this for support while you get dressed. Do not have throw rugs and other things on the floor that can make you trip. What can I do in the kitchen? Clean up any spills right away. Avoid walking on wet floors. Keep items that you use a lot in easy-to-reach places. If you need to reach something above you, use a strong step stool that has a grab bar. Keep electrical cords out of the way. Do not use floor polish or wax that makes floors slippery. If you must use wax, use non-skid floor wax. Do not have throw rugs and other things on the floor that can make you trip. What can I do with my stairs? Do not leave any items on  the stairs. Make sure that there are handrails on both sides of the stairs and use them. Fix handrails that are broken or loose. Make sure that handrails are as long as the stairways. Check any carpeting to make sure that it is firmly attached to the stairs. Fix any carpet that is loose or worn. Avoid having throw rugs at the top or bottom of the stairs. If you do have throw rugs, attach them to the floor with carpet tape. Make sure that you have a light switch at the top of the stairs and the bottom of the stairs. If you do not have them, ask someone to add them for you. What else can I do to help prevent falls? Wear shoes that: Do not have high heels. Have  rubber bottoms. Are comfortable and fit you well. Are closed at the toe. Do not wear sandals. If you use a stepladder: Make sure that it is fully opened. Do not climb a closed stepladder. Make sure that both sides of the stepladder are locked into place. Ask someone to hold it for you, if possible. Clearly mark and make sure that you can see: Any grab bars or handrails. First and last steps. Where the edge of each step is. Use tools that help you move around (mobility aids) if they are needed. These include: Canes. Walkers. Scooters. Crutches. Turn on the lights when you go into a dark area. Replace any light bulbs as soon as they burn out. Set up your furniture so you have a clear path. Avoid moving your furniture around. If any of your floors are uneven, fix them. If there are any pets around you, be aware of where they are. Review your medicines with your doctor. Some medicines can make you feel dizzy. This can increase your chance of falling. Ask your doctor what other things that you can do to help prevent falls. This information is not intended to replace advice given to you by your health care provider. Make sure you discuss any questions you have with your health care provider. Document Released: 01/28/2009 Document Revised: 09/09/2015 Document Reviewed: 05/08/2014 Elsevier Interactive Patient Education  2017 [employer].

## 2020-10-06 NOTE — Progress Notes (Signed)
Subjective:   [name] Cook is a 70 y.o. male who presents for Medicare Annual/Subsequent preventive examination.  Review of Systems: N/A      I connected with the patient today by telephone and verified that I am speaking with the correct person using two identifiers. Location patient: home Location nurse: work Persons participating in the telephone visit: patient, nurse.   I discussed the limitations, risks, security and privacy concerns of performing an evaluation and management service by telephone and the availability of in person appointments. I also discussed with the patient that there may be a patient responsible charge related to this service. The patient expressed understanding and verbally consented to this telephonic visit.        Cardiac Risk Factors include: advanced age (>29men, >79 women);male gender;diabetes mellitus;hypertension;Other (see comment), Risk factor comments: hyperlipidemia     Objective:    Today's Vitals   There is no height or weight on file to calculate BMI.  Advanced Directives 10/06/2020 01/15/2020 12/04/2019 10/06/2019 07/16/2019 01/03/2019 12/30/2018  Does Patient Have a Medical Advance Directive? No No No No No No No  Would patient like information on creating a medical advance directive? No - Patient declined No - Patient declined - No - Patient declined No - Patient declined No - Patient declined No - Patient declined  Pre-existing out of facility DNR order (yellow form or pink MOST form) - - - - - - -    Current Medications (verified) Outpatient Encounter Medications as of 10/06/2020  Medication Sig   albuterol (VENTOLIN HFA) 108 (90 Base) MCG/ACT inhaler INHALE 2 PUFFS INTO THE LUNGS EVERY 4 HOURS AS NEEDED FOR WHEEZING OR SHORTNESS OF BREATH.   citalopram (CELEXA) 40 MG tablet TAKE 1 TABLET (40 MG TOTAL) BY MOUTH DAILY. FOR DEPRESSION.   clopidogrel (PLAVIX) 75 MG tablet TAKE 1 TABLET BY MOUTH EVERY DAY   diclofenac Sodium (VOLTAREN) 1 % GEL  Apply 2 g topically 4 (four) times daily.   fish oil-omega-3 fatty acids 1000 MG capsule Take 1 g by mouth daily.     fluticasone (FLONASE) 50 MCG/ACT nasal spray PLACE 1 SPRAY INTO BOTH NOSTRILS DAILY AS NEEDED FOR ALLERGIES OR RHINITIS.   furosemide (LASIX) 20 MG tablet TAKE 1 TABLET BY MOUTH EVERY DAY   gabapentin (NEURONTIN) 300 MG capsule Take 1 capsule (300 mg total) by mouth 3 (three) times daily.   glucose blood (ONE TOUCH ULTRA TEST) test strip USE TO TEST UP TO 4 TIMES DAILY   guaiFENesin-codeine (ROBITUSSIN AC) 100-10 MG/5ML syrup Take 5 mLs by mouth 3 (three) times daily as needed for cough.   isosorbide mononitrate (IMDUR) 60 MG 24 hr tablet TAKE 1 TABLET BY MOUTH EVERY DAY   JARDIANCE 10 MG TABS tablet TAKE 1 TABLET BY MOUTH EVERY MORNING FOR DIABETES   lisinopril (ZESTRIL) 2.5 MG tablet TAKE 1 TABLET BY MOUTH EVERY DAY   metFORMIN (GLUCOPHAGE) 500 MG tablet TAKE 1 TABLET BY MOUTH TWICE DAILY WITH A MEAL for diabetes.   metoprolol succinate (TOPROL-XL) 25 MG 24 hr tablet Take 0.5 tablets (12.5 mg total) by mouth daily.   nitroGLYCERIN (NITROSTAT) 0.4 MG SL tablet Place 1 tablet (0.4 mg total) under the tongue every 5 (five) minutes as needed for chest pain.   pantoprazole (PROTONIX) 40 MG tablet TAKE 1 TABLET (40 MG TOTAL) BY MOUTH DAILY FOR HEARTBURN.   rosuvastatin (CRESTOR) 20 MG tablet TAKE 1 TABLET BY MOUTH EVERY DAY IN THE EVENING FOR CHOLESTEROL   sitaGLIPtin (  JANUVIA) 100 MG tablet TAKE 1 TABLET BY MOUTH ONCE DAILY FOR DIABETES   SYMBICORT 160-4.5 MCG/ACT inhaler INHALE 2 PUFFS INTO THE LUNGS TWICE A DAY   tamsulosin (FLOMAX) 0.4 MG CAPS capsule TAKE 1 CAPSULE (0.4 MG TOTAL) BY MOUTH 2 (TWO) TIMES DAILY.   warfarin (COUMADIN) 5 MG tablet TAKE 1/2 TABLET DAILY EXCEPT TAKE 1 TABLET ON MONDAYS, THURSDAYS & Saturdays OR AS DIRECTED BY COUMADIN CLINIC   predniSONE (DELTASONE) 20 MG tablet Take 2 tablets by mouth once daily for 5 days. (Patient not taking: Reported on 10/06/2020)    No facility-administered encounter medications on file as of 10/06/2020.    Allergies (verified) Patient has no known allergies.   History: Past Medical History:  Diagnosis Date   Arthritis    "knees and lower back" (03/14/2013)   Atrial flutter ([place])    radiofrequency ablation in 2001   CAD (coronary artery disease)    a. Nonobstructive. Cardiac cath in 2001-50% mid RI, normal LM, LAD, RCA b. cath 10/16/2014 95% mid RCA treated with DES, 99% ost D1 medical management due to small aneurysmal segment   Chronic anticoagulation    chronic Coumadin anticoagulation   Chronic obstructive pulmonary disease ([place]) 04/20/2011   Diabetes mellitus, type 2 (HCC)    GERD (gastroesophageal reflux disease)    Hyperlipidemia    Hypertension    with hypertensive heart disease   Left knee pain 10/25/2017   medial   Obesity    Persistent atrial fibrillation ([place])    recurrent atrial flutter since 2001 s/p DCCVs, multiple failed AADs, h/o tachy-mediated cardiomyopathy   Shortness of breath    "can come on at any time" (03/14/2013)   Sleep apnea    "dx'd; couldn't wear the mask" (03/14/2013)   Tobacco abuse    Past Surgical History:  Procedure Laterality Date   ATRIAL FLUTTER ABLATION  2002   atrial flutter; subsequently developed atrial fibrillation   AV NODE ABLATION  01/24/2013   CARDIAC CATHETERIZATION  2002   CARDIAC CATHETERIZATION N/A 10/16/2014   Procedure: Left Heart Cath and Coronary Angiography;  Surgeon: [name], MD;  Location: [facility];  Service: Cardiovascular;  Laterality: N/A;   CARDIOVERSION  05/31/2011   Procedure: CARDIOVERSION;  Surgeon: Cristopher Estimable. Lattie Haw, MD;  Location: [facility];  Service: Cardiovascular;  Laterality: N/A;   CARPAL TUNNEL RELEASE Left 1980's   COLONOSCOPY WITH PROPOFOL N/A 12/30/2018   Procedure: COLONOSCOPY WITH PROPOFOL;  Surgeon: Jonathon Bellows, MD;  Location: [facility] ENDOSCOPY;  Service: Gastroenterology;  Laterality: N/A;   COLONOSCOPY WITH  PROPOFOL N/A 12/04/2019   Procedure: COLONOSCOPY WITH PROPOFOL;  Surgeon: Jonathon Bellows, MD;  Location: [facility] ENDOSCOPY;  Service: Gastroenterology;  Laterality: N/A;   INSERT / REPLACE / REMOVE PACEMAKER  01/24/2013    Medtronic Adapta L dual-chamber pacemaker, serial number [id number]    LEFT HEART CATH AND CORONARY ANGIOGRAPHY N/A 06/07/2018   Procedure: LEFT HEART CATH AND CORONARY ANGIOGRAPHY;  Surgeon: [name], MD;  Location: [facility];  Service: Cardiovascular;  Laterality: N/A;   LEFT HEART CATHETERIZATION WITH CORONARY ANGIOGRAM N/A 03/17/2013   Procedure: LEFT HEART CATHETERIZATION WITH CORONARY ANGIOGRAM;  Surgeon: Burnell Blanks, MD; LAD mild dz, D1 branch 100%, inferior branch 99%, CFX OK, RCA 50%, EF 65%     LOOP RECORDER IMPLANT  2002   PERMANENT PACEMAKER INSERTION N/A 01/24/2013   Procedure: PERMANENT PACEMAKER INSERTION;  Surgeon: [name], MD;  Location: [facility];  Service: Cardiovascular;  Laterality: N/A;   TIBIAL TUBERCLERPLASTY  ~ 2003   Family History  Problem Relation Age of Onset   Alzheimer's disease Mother    Osteoporosis Mother    Social History   Socioeconomic History   Marital status: Widowed    Spouse name: Not on file   Number of children: 1   Years of education: Not on file   Highest education level: Not on file  Occupational History   Occupation: Unemployed    Employer: UNEMPLOYED  Tobacco Use   Smoking status: Former    Packs/day: 1.00    Years: 42.00    Pack years: 42.00    Types: Cigarettes    Quit date: 12/31/2011    Years since quitting: 8.7   Smokeless tobacco: Never  Vaping Use   Vaping Use: Never used  Substance and Sexual Activity   Alcohol use: Not Currently    Comment: 03/14/2013 "stopped drinking back in 2002; never had problem w/it"   Drug use: No   Sexual activity: Not Currently  Other Topics Concern   Not on file  Social History Narrative   Single.   Retired.    1 son, deceased.     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   Enjoys playing on the computer.       Social Determinants of Health   Financial Resource Strain: Low Risk    Difficulty of Paying Living Expenses: Not hard at all  Food Insecurity: No Food Insecurity   Worried About Charity fundraiser in the Last Year: Never true   [place] in the Last Year: Never true  Transportation Needs: No Transportation Needs   Lack of Transportation (Medical): No   Lack of Transportation (Non-Medical): No  Physical Activity: Inactive   Days of Exercise per Week: 0 days   Minutes of Exercise per Session: 0 min  Stress: No Stress Concern Present   Feeling of Stress : Not at all  Social Connections: Not on file    Tobacco Counseling Counseling given: Not Answered   Clinical Intake:  Pre-visit preparation completed: Yes  Pain : No/denies pain     Nutritional Risks: None Diabetes: Yes CBG done?: No Did pt. bring in CBG monitor from home?: No  How often do you need to have someone help you when you read instructions, pamphlets, or other written materials from your doctor or pharmacy?: 1 - Never  Diabetic: Yes Nutrition Risk Assessment:  Has the patient had any N/V/D within the last 2 months?  No  Does the patient have any non-healing wounds?  No  Has the patient had any unintentional weight loss or weight gain?  No   Diabetes:  Is the patient diabetic?  Yes  If diabetic, was a CBG obtained today?  No  telephone visit  Did the patient bring in their glucometer from home?  No  telephone visit  How often do you monitor your CBG's? daily.   Financial Strains and Diabetes Management:  Are you having any financial strains with the device, your supplies or your medication? No .  Does the patient want to be seen by Chronic Care Management for management of their diabetes?  No  Would the patient like to be referred to a Nutritionist or for Diabetic Management?   No   Diabetic Exams:  Diabetic Eye Exam: Completed 08/28/2020 Diabetic Foot Exam: Completed 10/09/2019   Interpreter Needed?: No  Information entered by :: CJohnson, LPN  Activities of Daily Living In your present state of health, do you have any difficulty performing the following activities: 10/06/2020  Hearing? N  Vision? N  Difficulty concentrating or making decisions? N  Walking or climbing stairs? N  Dressing or bathing? N  Doing errands, shopping? N  Preparing Food and eating ? N  Using the Toilet? N  In the past six months, have you accidently leaked urine? N  Do you have problems with loss of bowel control? N  Managing your Medications? N  Managing your Finances? N  Housekeeping or managing your Housekeeping? N  Some recent data might be hidden    Patient Care Team: [name], NP as PCP - General (Internal Medicine) [name], MD as PCP - Cardiology (Cardiology) Rothbart, Cristopher Estimable, MD (Inactive) (Cardiology) Noreene Filbert, MD as Radiation Oncologist (Radiation Oncology)  Indicate any recent Medical Services you may have received from other than [facility] providers in the past year (date may be approximate).     Assessment:   This is a routine wellness examination for [place].  Hearing/Vision screen Vision Screening - Comments:: Patient gets annual eye exams  Dietary issues and exercise activities discussed: Current Exercise Habits: The patient does not participate in regular exercise at present, Exercise limited by: None identified   Goals Addressed             This Visit's Progress    Patient Stated       10/06/2020, I will maintain and continue medications as prescribed.         Depression Screen PHQ 2/9 Scores 10/06/2020 06/29/2020 02/06/2020 10/09/2019 10/06/2019 08/12/2019 09/23/2018  PHQ - 2 Score 0 0 0 4 0 0 0  PHQ- 9 Score 0 0 8 12 0 0 0    Fall Risk Fall Risk  10/06/2020 10/06/2019 08/12/2019 09/23/2018 09/21/2017  Falls in the past  year? 0 0 0 0 No  Number falls in past yr: 0 0 0 - -  Injury with Fall? 0 0 0 - -  Risk for fall due to : Medication side effect Medication side effect - - -  Follow up Falls prevention discussed;Falls evaluation completed Falls evaluation completed;Falls prevention discussed Falls evaluation completed - -    FALL RISK PREVENTION PERTAINING TO THE HOME:  Any stairs in or around the home? Yes  If so, are there any without handrails? No  Home free of loose throw rugs in walkways, pet beds, electrical cords, etc? Yes  Adequate lighting in your home to reduce risk of falls? Yes   ASSISTIVE DEVICES UTILIZED TO PREVENT FALLS:  Life alert? No  Use of a cane, walker or w/c? No  Grab bars in the bathroom? No  Shower chair or bench in shower? No  Elevated toilet seat or a handicapped toilet? No   TIMED UP AND GO:  Was the test performed?  N/A telephone visit .    Cognitive Function: MMSE - Mini Mental State Exam 10/06/2020 10/06/2019 09/23/2018 09/21/2017  Not completed: Refused Refused - -  Orientation to time - - 5 5  Orientation to Place - - 5 5  Registration - - 3 3  Attention/ Calculation - - 0 0  Recall - - 0 1  Recall-comments - - unable to recall 0 of 3 words unable to recall 2 of 3 words  Language- name 2 objects - - 0 0  Language- repeat - - 1 1  Language- follow 3 step command - - 0 3  Language- read & follow direction - - 0 0  Write a sentence - - 0 0  Copy design - - 0 0  Total score - - 14 18  Mini Cog  Mini-Cog screen was not completed. Patient wanted to skip. Maximum score is 22. A value of 0 denotes this part of the MMSE was not completed or the patient failed this part of the Mini-Cog screening.       Immunizations Immunization History  Administered Date(s) Administered   Influenza, High Dose Seasonal PF 02/20/2018, 02/03/2019   Influenza,inj,Quad PF,6+ Mos 02/05/2014, 01/04/2015, 04/20/2016   Influenza-Unspecified 01/05/2011, 01/15/2013, 01/30/2020    PFIZER(Purple Top)SARS-COV-2 Vaccination 09/17/2019, 10/08/2019   Pneumococcal Conjugate-13 09/21/2017   Pneumococcal Polysaccharide-23 01/05/2011   Tdap 01/05/2011    TDAP status: Up to date  Flu Vaccine status: Up to date  Pneumococcal vaccine status: Due, Education has been provided regarding the importance of this vaccine. Advised may receive this vaccine at local pharmacy or Health Dept. Aware to provide a copy of the vaccination record if obtained from local pharmacy or Health Dept. Verbalized acceptance and understanding.  Covid-19 vaccine status: Completed vaccines  Qualifies for Shingles Vaccine? Yes   Zostavax completed No   Shingrix Completed?: No.    Education has been provided regarding the importance of this vaccine. Patient has been advised to call insurance company to determine out of pocket expense if they have not yet received this vaccine. Advised may also receive vaccine at local pharmacy or Health Dept. Verbalized acceptance and understanding.  Screening Tests Health Maintenance  Topic Date Due   Zoster Vaccines- Shingrix (1 of 2) Never done   PNA vac Low Risk Adult (2 of 2 - PPSV23) 09/22/2018   COVID-19 Vaccine (3 - Pfizer risk series) 11/05/2019   HEMOGLOBIN A1C  07/06/2020   FOOT EXAM  10/08/2020   INFLUENZA VACCINE  11/15/2020   TETANUS/TDAP  01/04/2021   OPHTHALMOLOGY EXAM  08/28/2021   COLONOSCOPY (Pts 45-62yrs Insurance coverage will need to be confirmed)  12/03/2029   Hepatitis C Screening  Completed   HPV VACCINES  Aged Out    Health Maintenance  Health Maintenance Due  Topic Date Due   Zoster Vaccines- Shingrix (1 of 2) Never done   PNA vac Low Risk Adult (2 of 2 - PPSV23) 09/22/2018   COVID-19 Vaccine (3 - Pfizer risk series) 11/05/2019   HEMOGLOBIN A1C  07/06/2020    Colorectal cancer screening: due, bowel prep was poor on 12/04/2019 recommend repeat in 1 week. Patient aware and will call and schedule appointment.   Lung Cancer Screening:  (Low Dose CT Chest recommended if Age 64-80 years, 30 pack-year currently smoking OR have quit w/in 15 years.) does qualify.   Lung Cancer Screening Referral: due, ordered 10/06/2020  Additional Screening:  Hepatitis C Screening: does qualify; Completed 10/27/2015  Vision Screening: Recommended annual ophthalmology exams for early detection of glaucoma and other disorders of the eye. Is the patient up to date with their annual eye exam?  Yes  Who is the provider or what is the name of the office in which the patient attends annual eye exams? [facility] If pt is not established with a provider, would they like to be referred to a provider to establish care? No .   Dental Screening: Recommended annual dental exams for proper oral hygiene  Community Resource Referral / Chronic Care Management: CRR required this visit?  No   CCM required this visit?  No  Plan:     I have personally reviewed and noted the following in the patient's chart:   Medical and social history Use of alcohol, tobacco or illicit drugs  Current medications and supplements including opioid prescriptions. Patient is not currently taking opioid prescriptions. Functional ability and status Nutritional status Physical activity Advanced directives List of other physicians Hospitalizations, surgeries, and ER visits in previous 12 months Vitals Screenings to include cognitive, depression, and falls Referrals and appointments  In addition, I have reviewed and discussed with patient certain preventive protocols, quality metrics, and best practice recommendations. A written personalized care plan for preventive services as well as general preventive health recommendations were provided to patient.   Due to this being a telephonic visit, the after visit summary with patients personalized plan was offered to patient via office or my-chart. Patient preferred to pick up at office at next visit or via  mychart.   Andrez Grime, LPN   [id number]

## 2020-10-06 NOTE — Progress Notes (Addendum)
PCP notes:  Health Maintenance: Pneumococcal 23- due Shingrix- due Covid booster- due Colonoscopy- due Lung cancer screening- due  Abnormal Screenings: none   Patient concerns: Needs refill on gabapentin    Nurse concerns: none   Next PCP appt.: 10/07/2020 @ 9:40 am

## 2020-10-07 ENCOUNTER — Ambulatory Visit (INDEPENDENT_AMBULATORY_CARE_PROVIDER_SITE_OTHER): Payer: Medicare Other | Admitting: Primary Care

## 2020-10-07 ENCOUNTER — Other Ambulatory Visit: Payer: Self-pay | Admitting: Primary Care

## 2020-10-07 ENCOUNTER — Encounter: Payer: Self-pay | Admitting: Primary Care

## 2020-10-07 ENCOUNTER — Other Ambulatory Visit: Payer: Self-pay

## 2020-10-07 VITALS — BP 118/72 | HR 82 | Temp 97.7°F | Ht 71.0 in | Wt 231.0 lb

## 2020-10-07 DIAGNOSIS — E119 Type 2 diabetes mellitus without complications: Secondary | ICD-10-CM | POA: Diagnosis not present

## 2020-10-07 DIAGNOSIS — E785 Hyperlipidemia, unspecified: Secondary | ICD-10-CM

## 2020-10-07 DIAGNOSIS — E1142 Type 2 diabetes mellitus with diabetic polyneuropathy: Secondary | ICD-10-CM | POA: Diagnosis not present

## 2020-10-07 DIAGNOSIS — K635 Polyp of colon: Secondary | ICD-10-CM | POA: Diagnosis not present

## 2020-10-07 DIAGNOSIS — K219 Gastro-esophageal reflux disease without esophagitis: Secondary | ICD-10-CM

## 2020-10-07 DIAGNOSIS — Z0000 Encounter for general adult medical examination without abnormal findings: Secondary | ICD-10-CM | POA: Diagnosis not present

## 2020-10-07 DIAGNOSIS — M8949 Other hypertrophic osteoarthropathy, multiple sites: Secondary | ICD-10-CM

## 2020-10-07 DIAGNOSIS — I4819 Other persistent atrial fibrillation: Secondary | ICD-10-CM

## 2020-10-07 DIAGNOSIS — G4700 Insomnia, unspecified: Secondary | ICD-10-CM | POA: Diagnosis not present

## 2020-10-07 DIAGNOSIS — I10 Essential (primary) hypertension: Secondary | ICD-10-CM

## 2020-10-07 DIAGNOSIS — M159 Polyosteoarthritis, unspecified: Secondary | ICD-10-CM

## 2020-10-07 DIAGNOSIS — C61 Malignant neoplasm of prostate: Secondary | ICD-10-CM

## 2020-10-07 DIAGNOSIS — Z950 Presence of cardiac pacemaker: Secondary | ICD-10-CM

## 2020-10-07 DIAGNOSIS — I25119 Atherosclerotic heart disease of native coronary artery with unspecified angina pectoris: Secondary | ICD-10-CM

## 2020-10-07 DIAGNOSIS — F339 Major depressive disorder, recurrent, unspecified: Secondary | ICD-10-CM

## 2020-10-07 DIAGNOSIS — G4733 Obstructive sleep apnea (adult) (pediatric): Secondary | ICD-10-CM

## 2020-10-07 DIAGNOSIS — J439 Emphysema, unspecified: Secondary | ICD-10-CM

## 2020-10-07 LAB — COMPREHENSIVE METABOLIC PANEL
ALT: 20 U/L (ref 0–53)
AST: 21 U/L (ref 0–37)
Albumin: 4.3 g/dL (ref 3.5–5.2)
Alkaline Phosphatase: 51 U/L (ref 39–117)
BUN: 26 mg/dL — ABNORMAL HIGH (ref 6–23)
CO2: 26 mEq/L (ref 19–32)
Calcium: 9.7 mg/dL (ref 8.4–10.5)
Chloride: 105 mEq/L (ref 96–112)
Creatinine, Ser: 1.16 mg/dL (ref 0.40–1.50)
GFR: 64.77 mL/min (ref >60.00)
Glucose, Bld: 194 mg/dL — ABNORMAL HIGH (ref 70–99)
Potassium: 4 mEq/L (ref 3.5–5.1)
Sodium: 139 mEq/L (ref 135–145)
Total Bilirubin: 0.6 mg/dL (ref 0.2–1.2)
Total Protein: 6.5 g/dL (ref 6.0–8.3)

## 2020-10-07 LAB — CBC
HCT: 44.8 % (ref 39.0–52.0)
Hemoglobin: 15.3 g/dL (ref 13.0–17.0)
MCHC: 34.1 g/dL (ref 30.0–36.0)
MCV: 95.8 fl (ref 78.0–100.0)
Platelets: 109 10*3/uL — ABNORMAL LOW (ref 150.0–400.0)
RBC: 4.68 Mil/uL (ref 4.22–5.81)
RDW: 13.8 % (ref 11.5–15.5)
WBC: 4.9 10*3/uL (ref 4.0–10.5)

## 2020-10-07 LAB — LIPID PANEL
Cholesterol: 131 mg/dL (ref 0–200)
HDL: 43.9 mg/dL (ref >39.00)
LDL Cholesterol: 55 mg/dL (ref 0–99)
NonHDL: 87.54
Total CHOL/HDL Ratio: 3
Triglycerides: 161 mg/dL — ABNORMAL HIGH (ref 0.0–149.0)
VLDL: 32.2 mg/dL (ref 0.0–40.0)

## 2020-10-07 LAB — HEMOGLOBIN A1C: Hgb A1c MFr Bld: 7.6 % — ABNORMAL HIGH (ref 4.6–6.5)

## 2020-10-07 MED ORDER — GABAPENTIN 300 MG PO CAPS: 300.0000 mg | ORAL_CAPSULE | Freq: Two times a day (BID) | ORAL | 3 refills | Status: DC

## 2020-10-07 NOTE — Assessment & Plan Note (Signed)
Stable, compliant to warfarin per our office. Continue metoprolol succinate 25 mg and warfarin.

## 2020-10-07 NOTE — Assessment & Plan Note (Signed)
Doing well on citalopram 40 mg, continue same.  

## 2020-10-07 NOTE — Assessment & Plan Note (Signed)
No use of CPAP machine in years, unable to tolerate. Declines further treatment.

## 2020-10-07 NOTE — Assessment & Plan Note (Signed)
Chronic, overall stable.  Continue gabapentin 300 mg BID.

## 2020-10-07 NOTE — Assessment & Plan Note (Signed)
Repeat A1C pending.  Glucose readings stable at 120-140's.  Continue metformin 500 mg BID, sitagliptin 100 mg daily, Jardiance 10 mg.   Follow up in 3-6 months.

## 2020-10-07 NOTE — Assessment & Plan Note (Signed)
Denies concerns today. Continue citalopram 40 mg.

## 2020-10-07 NOTE — Assessment & Plan Note (Signed)
Remote pacemaker check recently reviewed.

## 2020-10-07 NOTE — Assessment & Plan Note (Signed)
Appears stable. Continue Symbicort BID and PRN albuterol. Infrequent use of albuterol

## 2020-10-07 NOTE — Assessment & Plan Note (Signed)
Doing well on pantoprazole 40 mg, continue same.

## 2020-10-07 NOTE — Assessment & Plan Note (Signed)
Controlled in the office, continue furosemide 20 mg, lisinopril 2.5 mg, metoprolol succinate 25 mg.

## 2020-10-07 NOTE — Assessment & Plan Note (Signed)
Compliant to rosuvastatin 20 mg, continue same. Repeat lipid panel pending.  

## 2020-10-07 NOTE — Assessment & Plan Note (Signed)
Asymptomatic, follows with cardiology.  Continue clopidogrel 75 mg, metoprolol succinate 25 mg, Imdur 60 mg.

## 2020-10-07 NOTE — Patient Instructions (Signed)
Stop by the lab prior to leaving today. I will notify you of your results once received.   Call your GI doctor to reschedule your colonoscopy.   Please schedule a follow up visit for 6 months for diabetes check.  It was a pleasure to see you today!  Preventive Care 68 Years and Older, Male Preventive care refers to lifestyle choices and visits with your health care provider that can promote health and wellness. This includes: A yearly physical exam. This is also called an annual wellness visit. Regular dental and eye exams. Immunizations. Screening for certain conditions. Healthy lifestyle choices, such as: Eating a healthy diet. Getting regular exercise. Not using drugs or products that contain nicotine and tobacco. Limiting alcohol use. What can I expect for my preventive care visit? Physical exam Your health care provider will check your: Height and weight. These may be used to calculate your BMI (body mass index). BMI is a measurement that tells if you are at a healthy weight. Heart rate and blood pressure. Body temperature. Skin for abnormal spots. Counseling Your health care provider may ask you questions about your: Past medical problems. Family's medical history. Alcohol, tobacco, and drug use. Emotional well-being. Home life and relationship well-being. Sexual activity. Diet, exercise, and sleep habits. History of falls. Memory and ability to understand (cognition). Work and work Statistician. Access to firearms. What immunizations do I need?  Vaccines are usually given at various ages, according to a schedule. Your health care provider will recommend vaccines for you based on your age, medicalhistory, and lifestyle or other factors, such as travel or where you work. What tests do I need? Blood tests Lipid and cholesterol levels. These may be checked every 5 years, or more often depending on your overall health. Hepatitis C test. Hepatitis B  test. Screening Lung cancer screening. You may have this screening every year starting at age 71 if you have a 30-pack-year history of smoking and currently smoke or have quit within the past 15 years. Colorectal cancer screening. All adults should have this screening starting at age 31 and continuing until age 84. Your health care provider may recommend screening at age 49 if you are at increased risk. You will have tests every 1-10 years, depending on your results and the type of screening test. Prostate cancer screening. Recommendations will vary depending on your family history and other risks. Genital exam to check for testicular cancer or hernias. Diabetes screening. This is done by checking your blood sugar (glucose) after you have not eaten for a while (fasting). You may have this done every 1-3 years. Abdominal aortic aneurysm (AAA) screening. You may need this if you are a current or former smoker. STD (sexually transmitted disease) testing, if you are at risk. Follow these instructions at home: Eating and drinking  Eat a diet that includes fresh fruits and vegetables, whole grains, lean protein, and low-fat dairy products. Limit your intake of foods with high amounts of sugar, saturated fats, and salt. Take vitamin and mineral supplements as recommended by your health care provider. Do not drink alcohol if your health care provider tells you not to drink. If you drink alcohol: Limit how much you have to 0-2 drinks a day. Be aware of how much alcohol is in your drink. In the [place] one drink equals one 12 oz bottle of beer (355 mL), one 5 oz glass of wine (148 mL), or one 1 oz glass of hard liquor (44 mL).  Lifestyle Take daily care  of your teeth and gums. Brush your teeth every morning and night with fluoride toothpaste. Floss one time each day. Stay active. Exercise for at least 30 minutes 5 or more days each week. Do not use any products that contain nicotine or tobacco, such  as cigarettes, e-cigarettes, and chewing tobacco. If you need help quitting, ask your health care provider. Do not use drugs. If you are sexually active, practice safe sex. Use a condom or other form of protection to prevent STIs (sexually transmitted infections). Talk with your health care provider about taking a low-dose aspirin or statin. Find healthy ways to cope with stress, such as: Meditation, yoga, or listening to music. Journaling. Talking to a trusted person. Spending time with friends and family. Safety Always wear your seat belt while driving or riding in a vehicle. Do not drive: If you have been drinking alcohol. Do not ride with someone who has been drinking. When you are tired or distracted. While texting. Wear a helmet and other protective equipment during sports activities. If you have firearms in your house, make sure you follow all gun safety procedures. What's next? Visit your health care provider once a year for an annual wellness visit. Ask your health care provider how often you should have your eyes and teeth checked. Stay up to date on all vaccines. This information is not intended to replace advice given to you by your health care provider. Make sure you discuss any questions you have with your healthcare provider. Document Revised: 12/31/2018 Document Reviewed: 03/28/2018 Elsevier Patient Education  2022 [employer].

## 2020-10-07 NOTE — Assessment & Plan Note (Signed)
Declines Shingrix, other vaccines UTD. PSA UTD. Colonoscopy overdue, he will schedule.  Discussed the importance of a healthy diet and regular exercise in order for weight loss, and to reduce the risk of further co-morbidity.  Exam today stable. Labs pending.

## 2020-10-07 NOTE — Progress Notes (Signed)
Subjective:    Patient ID: [name] Cook, male    DOB: 1950-07-05, 70 y.o.   MRN: [id number]  HPI  [name] Cook is a very pleasant 70 y.o. male who presents today for complete physical.  Immunizations: -Tetanus: 2012 -Influenza: Due this season  -Covid-19: 2 vaccines -Shingles: Never completed, never had chicken pox -Pneumonia: Prevnar 13 in 2019, pneumovax in 2012   Diet: [place].  Exercise: No regular exercise, is active   Eye exam: Completes annually, due again in July  Dental exam: No recent   Colonoscopy: Completed in August 2021, due again September 2021 due to inappropriate bowel prep. Has not scheduled, agrees to do so. PSA: Due   BP Readings from Last 3 Encounters:  10/07/20 118/72  06/29/20 137/87  06/23/20 110/70      Review of Systems  Constitutional:  Negative for unexpected weight change.  HENT:  Negative for rhinorrhea.   Respiratory:  Negative for shortness of breath.   Cardiovascular:  Negative for chest pain.  Gastrointestinal:  Negative for constipation and diarrhea.  Genitourinary:  Negative for difficulty urinating.  Musculoskeletal:  Positive for arthralgias.  Skin:  Negative for rash.  Allergic/Immunologic: Negative for environmental allergies.  Neurological:  Negative for dizziness and headaches.  Psychiatric/Behavioral:  The patient is not nervous/anxious.         Past Medical History:  Diagnosis Date   Arthritis    "knees and lower back" (03/14/2013)   Atrial flutter ([place])    radiofrequency ablation in 2001   CAD (coronary artery disease)    a. Nonobstructive. Cardiac cath in 2001-50% mid RI, normal LM, LAD, RCA b. cath 10/16/2014 95% mid RCA treated with DES, 99% ost D1 medical management due to small aneurysmal segment   Chronic anticoagulation    chronic Coumadin anticoagulation   Chronic obstructive pulmonary disease ([place]) 04/20/2011   Diabetes mellitus, type 2 (HCC)    GERD (gastroesophageal reflux disease)    Hyperlipidemia     Hypertension    with hypertensive heart disease   Left knee pain 10/25/2017   medial   Obesity    Persistent atrial fibrillation ([place])    recurrent atrial flutter since 2001 s/p DCCVs, multiple failed AADs, h/o tachy-mediated cardiomyopathy   Shortness of breath    "can come on at any time" (03/14/2013)   Sleep apnea    "dx'd; couldn't wear the mask" (03/14/2013)   Tobacco abuse     Social History   Socioeconomic History   Marital status: Widowed    Spouse name: Not on file   Number of children: 1   Years of education: Not on file   Highest education level: Not on file  Occupational History   Occupation: Unemployed    Employer: UNEMPLOYED  Tobacco Use   Smoking status: Former    Packs/day: 1.00    Years: 42.00    Pack years: 42.00    Types: Cigarettes    Quit date: 12/31/2011    Years since quitting: 8.7   Smokeless tobacco: Never  Vaping Use   Vaping Use: Never used  Substance and Sexual Activity   Alcohol use: Not Currently    Comment: 03/14/2013 "stopped drinking back in 2002; never had problem w/it"   Drug use: No   Sexual activity: Not Currently  Other Topics Concern   Not on file  Social History Narrative   Single.   Retired.    1 son, deceased.    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   Enjoys playing on the computer.       Social Determinants of Health   Financial Resource Strain: Low Risk    Difficulty of Paying Living Expenses: Not hard at all  Food Insecurity: No Food Insecurity   Worried About Charity fundraiser in the Last Year: Never true   [place] in the Last Year: Never true  Transportation Needs: No Transportation Needs   Lack of Transportation (Medical): No   Lack of Transportation (Non-Medical): No  Physical Activity: Inactive   Days of Exercise per Week: 0 days   Minutes of Exercise per Session: 0 min  Stress: No Stress Concern Present   Feeling of Stress : Not at all  Social  Connections: Not on file  Intimate Partner Violence: Not At Risk   Fear of Current or Ex-Partner: No   Emotionally Abused: No   Physically Abused: No   Sexually Abused: No    Past Surgical History:  Procedure Laterality Date   ATRIAL FLUTTER ABLATION  2002   atrial flutter; subsequently developed atrial fibrillation   AV NODE ABLATION  01/24/2013   CARDIAC CATHETERIZATION  2002   CARDIAC CATHETERIZATION N/A 10/16/2014   Procedure: Left Heart Cath and Coronary Angiography;  Surgeon: [name], MD;  Location: [name];  Service: Cardiovascular;  Laterality: N/A;   CARDIOVERSION  05/31/2011   Procedure: CARDIOVERSION;  Surgeon: Cristopher Estimable. Lattie Haw, MD;  Location: [facility];  Service: Cardiovascular;  Laterality: N/A;   CARPAL TUNNEL RELEASE Left 1980's   COLONOSCOPY WITH PROPOFOL N/A 12/30/2018   Procedure: COLONOSCOPY WITH PROPOFOL;  Surgeon: Jonathon Bellows, MD;  Location: [facility] ENDOSCOPY;  Service: Gastroenterology;  Laterality: N/A;   COLONOSCOPY WITH PROPOFOL N/A 12/04/2019   Procedure: COLONOSCOPY WITH PROPOFOL;  Surgeon: Jonathon Bellows, MD;  Location[facility] ENDOSCOPY;  Service: Gastroenterology;  Laterality: N/A;   INSERT / REPLACE / REMOVE PACEMAKER  01/24/2013    Medtronic Adapta L dual-chamber pacemaker, serial number [id number]    LEFT HEART CATH AND CORONARY ANGIOGRAPHY N/A 06/07/2018   Procedure: LEFT HEART CATH AND CORONARY ANGIOGRAPHY;  Surgeon: [name], MD;  Location: [place];  Service: Cardiovascular;  Laterality: N/A;   LEFT HEART CATHETERIZATION WITH CORONARY ANGIOGRAM N/A 03/17/2013   Procedure: LEFT HEART CATHETERIZATION WITH CORONARY ANGIOGRAM;  Surgeon: Burnell Blanks, MD; LAD mild dz, D1 branch 100%, inferior branch 99%, CFX OK, RCA 50%, EF 65%     LOOP RECORDER IMPLANT  2002   PERMANENT PACEMAKER INSERTION N/A 01/24/2013   Procedure: PERMANENT PACEMAKER INSERTION;  Surgeon: [name], MD;  Location: [facility];  Service:  Cardiovascular;  Laterality: N/A;   TIBIAL TUBERCLERPLASTY  ~ 2003    Family History  Problem Relation Age of Onset   Alzheimer's disease Mother    Osteoporosis Mother     No Known Allergies  Current Outpatient Medications on File Prior to Visit  Medication Sig Dispense Refill   albuterol (VENTOLIN HFA) 108 (90 Base) MCG/ACT inhaler INHALE 2 PUFFS INTO THE LUNGS EVERY 4 HOURS AS NEEDED FOR WHEEZING OR SHORTNESS OF BREATH. 6.7 each 0   citalopram (CELEXA) 40 MG tablet TAKE 1 TABLET (40 MG TOTAL) BY MOUTH DAILY. FOR DEPRESSION. 90 tablet 1   clopidogrel (PLAVIX) 75 MG tablet TAKE 1 TABLET BY MOUTH EVERY DAY 90 tablet 3   diclofenac Sodium (VOLTAREN) 1 % GEL Apply 2 g topically 4 (four) times daily. 50 g 0  fish oil-omega-3 fatty acids 1000 MG capsule Take 1 g by mouth daily.       fluticasone (FLONASE) 50 MCG/ACT nasal spray PLACE 1 SPRAY INTO BOTH NOSTRILS DAILY AS NEEDED FOR ALLERGIES OR RHINITIS. 48 mL 3   furosemide (LASIX) 20 MG tablet TAKE 1 TABLET BY MOUTH EVERY DAY 90 tablet 3   glucose blood (ONE TOUCH ULTRA TEST) test strip USE TO TEST UP TO 4 TIMES DAILY 100 each 2   isosorbide mononitrate (IMDUR) 60 MG 24 hr tablet TAKE 1 TABLET BY MOUTH EVERY DAY 90 tablet 3   JARDIANCE 10 MG TABS tablet TAKE 1 TABLET BY MOUTH EVERY MORNING FOR DIABETES 90 tablet 1   lisinopril (ZESTRIL) 2.5 MG tablet TAKE 1 TABLET BY MOUTH EVERY DAY 90 tablet 3   metFORMIN (GLUCOPHAGE) 500 MG tablet TAKE 1 TABLET BY MOUTH TWICE DAILY WITH A MEAL for diabetes. 180 tablet 0   metoprolol succinate (TOPROL-XL) 25 MG 24 hr tablet Take 0.5 tablets (12.5 mg total) by mouth daily. 45 tablet 3   nitroGLYCERIN (NITROSTAT) 0.4 MG SL tablet Place 1 tablet (0.4 mg total) under the tongue every 5 (five) minutes as needed for chest pain. 25 tablet 0   pantoprazole (PROTONIX) 40 MG tablet TAKE 1 TABLET (40 MG TOTAL) BY MOUTH DAILY FOR HEARTBURN. 90 tablet 0   rosuvastatin (CRESTOR) 20 MG tablet TAKE 1 TABLET BY MOUTH EVERY  DAY IN THE EVENING FOR CHOLESTEROL 90 tablet 0   sitaGLIPtin (JANUVIA) 100 MG tablet TAKE 1 TABLET BY MOUTH ONCE DAILY FOR DIABETES 90 tablet 0   SYMBICORT 160-4.5 MCG/ACT inhaler INHALE 2 PUFFS INTO THE LUNGS TWICE A DAY 10.2 each 3   tamsulosin (FLOMAX) 0.4 MG CAPS capsule TAKE 1 CAPSULE (0.4 MG TOTAL) BY MOUTH 2 (TWO) TIMES DAILY. 180 capsule 3   warfarin (COUMADIN) 5 MG tablet TAKE 1/2 TABLET DAILY EXCEPT TAKE 1 TABLET ON MONDAYS, THURSDAYS & Saturdays OR AS DIRECTED BY COUMADIN CLINIC 90 tablet 0   No current facility-administered medications on file prior to visit.    BP 118/72   Pulse 82   Temp 97.7 F (36.5 C) (Temporal)   Ht 5\' 11"  (1.803 m)   Wt 231 lb (104.8 kg)   SpO2 95%   BMI 32.22 kg/m  Objective:   Physical Exam HENT:     Right Ear: Tympanic membrane and ear canal normal.     Left Ear: Tympanic membrane and ear canal normal.     Nose: Nose normal.     Right Sinus: No maxillary sinus tenderness or frontal sinus tenderness.     Left Sinus: No maxillary sinus tenderness or frontal sinus tenderness.  Eyes:     Conjunctiva/sclera: Conjunctivae normal.  Neck:     Thyroid: No thyromegaly.     Vascular: No carotid bruit.  Cardiovascular:     Rate and Rhythm: Normal rate and regular rhythm.     Heart sounds: Normal heart sounds.  Pulmonary:     Effort: Pulmonary effort is normal.     Breath sounds: Normal breath sounds. No wheezing or rales.  Abdominal:     General: Bowel sounds are normal.     Palpations: Abdomen is soft.     Tenderness: There is no abdominal tenderness.  Musculoskeletal:        General: Normal range of motion.     Cervical back: Neck supple.  Skin:    General: Skin is warm and dry.  Neurological:     Mental  Status: He is alert and oriented to person, place, and time.     Cranial Nerves: No cranial nerve deficit.     Deep Tendon Reflexes: Reflexes are normal and symmetric.  Psychiatric:        Mood and Affect: Mood normal.           Assessment & Plan:      This visit occurred during the SARS-CoV-2 public health emergency.  Safety protocols were in place, including screening questions prior to the visit, additional usage of staff PPE, and extensive cleaning of exam room while observing appropriate contact time as indicated for disinfecting solutions.

## 2020-10-07 NOTE — Assessment & Plan Note (Addendum)
Following with Urology and Oncology. Recent visit reviewed.  PSA is UTD.  Continue tamsulosin 0.4 mg daily.

## 2020-10-07 NOTE — Assessment & Plan Note (Signed)
Overdue for repeat colonoscopy, notified patient, he will schedule.

## 2020-10-12 ENCOUNTER — Other Ambulatory Visit: Payer: Self-pay | Admitting: Primary Care

## 2020-10-12 DIAGNOSIS — E119 Type 2 diabetes mellitus without complications: Secondary | ICD-10-CM

## 2020-10-12 MED ORDER — METFORMIN HCL 500 MG PO TABS: ORAL_TABLET | ORAL | 1 refills | Status: DC

## 2020-10-17 ENCOUNTER — Other Ambulatory Visit: Payer: Self-pay | Admitting: Primary Care

## 2020-10-17 DIAGNOSIS — J439 Emphysema, unspecified: Secondary | ICD-10-CM

## 2020-10-25 ENCOUNTER — Other Ambulatory Visit: Payer: Self-pay | Admitting: Primary Care

## 2020-10-25 DIAGNOSIS — K219 Gastro-esophageal reflux disease without esophagitis: Secondary | ICD-10-CM

## 2020-10-26 ENCOUNTER — Telehealth (INDEPENDENT_AMBULATORY_CARE_PROVIDER_SITE_OTHER): Payer: Medicare Other | Admitting: Primary Care

## 2020-10-26 ENCOUNTER — Encounter: Payer: Self-pay | Admitting: Primary Care

## 2020-10-26 VITALS — BP 125/82 | HR 80 | Ht 71.0 in | Wt 231.0 lb

## 2020-10-26 DIAGNOSIS — R1032 Left lower quadrant pain: Secondary | ICD-10-CM | POA: Insufficient documentation

## 2020-10-26 MED ORDER — AMOXICILLIN-POT CLAVULANATE 875-125 MG PO TABS: 1.0000 | ORAL_TABLET | Freq: Two times a day (BID) | ORAL | 0 refills | Status: DC

## 2020-10-26 NOTE — Patient Instructions (Signed)
Start Augmentin antibiotics for the infection Take 1 tablet by mouth twice daily for 10 days.  Continue to work on a clear liquid diet until symptoms have improved.  Please notify us in 3-4 days if no improvement. Go to the hospital if you get worse.   It was a pleasure to see you today!

## 2020-10-26 NOTE — Progress Notes (Signed)
Patient ID: [name] Cook, male    DOB: 01-13-1951, 70 y.o.   MRN: [id number]  Virtual visit completed through [employer], a video enabled telemedicine application. Due to national recommendations of social distancing due to COVID-19, a virtual visit is felt to be most appropriate for this patient at this time. Reviewed limitations, risks, security and privacy concerns of performing a virtual visit and the availability of in person appointments. I also reviewed that there may be a patient responsible charge related to this service. The patient agreed to proceed.   Patient location: home Provider location: [facility], office Persons participating in this virtual visit: patient, provider   If any vitals were documented, they were collected by patient at home unless specified below.    Patient unable to connect via video so we conducted our visit via phone which lasted 6 min and 6 sec.  BP 125/82   Pulse 80   Ht 5\' 11"  (1.803 m)   Wt 231 lb (104.8 kg)   BMI 32.22 kg/m    CC: Diarrhea Subjective:   HPI: [name] Cook is a 70 y.o. male with a history of decreased renal function, acute diverticulitis, type 2 diabetes, CAD, prostate cancer presenting on 10/26/2020 for abdominal pain.  His pain is located to the left lower quadrant which began about two weeks ago, worse a few days ago. Also with diarrhea 2-3 times daily, especially after meals.   He denies bloody stools, fevers, chills. He's been eating liquid diet for a few days. He's been feeling more fatigued, abdominal pain is some better this morning. His last episode of diarrhea was 24 hours ago.   He was last evaluated on 10/07/20, at the time his metformin was increased to 1000 mg in AM with continuation of 500 mg in PM. Metformin has never caused GI upset.  History of sigmoid diverticulitis, cholelithiasis without acute inflammation as evidenced on CT scan from October 2021, treated with Augmentin course, did well. Recent  symptoms feel similar to symptoms from last year. He is overdue for colonoscopy, he plans on calling to schedule today.        Relevant past medical, surgical, family and social history reviewed and updated as indicated. Interim medical history since our last visit reviewed. Allergies and medications reviewed and updated. Outpatient Medications Prior to Visit  Medication Sig Dispense Refill   albuterol (VENTOLIN HFA) 108 (90 Base) MCG/ACT inhaler INHALE 2 PUFFS BY MOUTH EVERY 4 HOURS AS NEEDED FOR WHEEZE OR FOR SHORTNESS OF BREATH 6.7 each 0   citalopram (CELEXA) 40 MG tablet TAKE 1 TABLET (40 MG TOTAL) BY MOUTH DAILY. FOR DEPRESSION. 90 tablet 1   clopidogrel (PLAVIX) 75 MG tablet TAKE 1 TABLET BY MOUTH EVERY DAY 90 tablet 3   diclofenac Sodium (VOLTAREN) 1 % GEL Apply 2 g topically 4 (four) times daily. 50 g 0   fish oil-omega-3 fatty acids 1000 MG capsule Take 1 g by mouth daily.       fluticasone (FLONASE) 50 MCG/ACT nasal spray PLACE 1 SPRAY INTO BOTH NOSTRILS DAILY AS NEEDED FOR ALLERGIES OR RHINITIS. 48 mL 3   furosemide (LASIX) 20 MG tablet TAKE 1 TABLET BY MOUTH EVERY DAY 90 tablet 3   gabapentin (NEURONTIN) 300 MG capsule Take 1 capsule (300 mg total) by mouth 2 (two) times daily. For neuropathy 180 capsule 3   glucose blood (ONE TOUCH ULTRA TEST) test strip USE TO TEST UP TO 4 TIMES DAILY 100 each 2  isosorbide mononitrate (IMDUR) 60 MG 24 hr tablet TAKE 1 TABLET BY MOUTH EVERY DAY 90 tablet 3   JARDIANCE 10 MG TABS tablet TAKE 1 TABLET BY MOUTH EVERY MORNING FOR DIABETES 90 tablet 1   lisinopril (ZESTRIL) 2.5 MG tablet TAKE 1 TABLET BY MOUTH EVERY DAY 90 tablet 3   metFORMIN (GLUCOPHAGE) 500 MG tablet Take 2 tablets by mouth every morning with breakfast and 1 take tablet by mouth every evening with dinner for diabetes 270 tablet 1   metoprolol succinate (TOPROL-XL) 25 MG 24 hr tablet Take 0.5 tablets (12.5 mg total) by mouth daily. 45 tablet 3   nitroGLYCERIN (NITROSTAT) 0.4 MG  SL tablet Place 1 tablet (0.4 mg total) under the tongue every 5 (five) minutes as needed for chest pain. 25 tablet 0   pantoprazole (PROTONIX) 40 MG tablet TAKE 1 TABLET (40 MG TOTAL) BY MOUTH DAILY FOR HEARTBURN. 90 tablet 3   rosuvastatin (CRESTOR) 20 MG tablet TAKE 1 TABLET BY MOUTH EVERY DAY IN THE EVENING FOR CHOLESTEROL 90 tablet 0   sitaGLIPtin (JANUVIA) 100 MG tablet TAKE 1 TABLET BY MOUTH ONCE DAILY FOR DIABETES 90 tablet 0   SYMBICORT 160-4.5 MCG/ACT inhaler INHALE 2 PUFFS INTO THE LUNGS TWICE A DAY 10.2 each 3   tamsulosin (FLOMAX) 0.4 MG CAPS capsule TAKE 1 CAPSULE (0.4 MG TOTAL) BY MOUTH 2 (TWO) TIMES DAILY. 180 capsule 3   warfarin (COUMADIN) 5 MG tablet TAKE 1/2 TABLET DAILY EXCEPT TAKE 1 TABLET ON MONDAYS, THURSDAYS & Saturdays OR AS DIRECTED BY COUMADIN CLINIC 90 tablet 0   No facility-administered medications prior to visit.     Per HPI unless specifically indicated in ROS section below Review of Systems  Constitutional:  Positive for fatigue. Negative for chills and fever.  Gastrointestinal:  Positive for abdominal pain and diarrhea. Negative for blood in stool, constipation, nausea and vomiting.  Objective:  BP 125/82   Pulse 80   Ht 5\' 11"  (1.803 m)   Wt 231 lb (104.8 kg)   BMI 32.22 kg/m   Wt Readings from Last 3 Encounters:  10/26/20 231 lb (104.8 kg)  10/07/20 231 lb (104.8 kg)  06/29/20 235 lb (106.6 kg)       Physical exam: Gen: alert, NAD, no distress Pulm: speaks in complete sentences without increased work of breathing Psych: normal mood, normal thought content      Results for orders placed or performed in visit on 10/07/20  Comprehensive metabolic panel  Result Value Ref Range   Sodium 139 135 - 145 mEq/L   Potassium 4.0 3.5 - 5.1 mEq/L   Chloride 105 96 - 112 mEq/L   CO2 26 19 - 32 mEq/L   Glucose, Bld 194 (H) 70 - 99 mg/dL   BUN 26 (H) 6 - 23 mg/dL   Creatinine, Ser 1.16 0.40 - 1.50 mg/dL   Total Bilirubin 0.6 0.2 - 1.2 mg/dL   Alkaline  Phosphatase 51 39 - 117 U/L   AST 21 0 - 37 U/L   ALT 20 0 - 53 U/L   Total Protein 6.5 6.0 - 8.3 g/dL   Albumin 4.3 3.5 - 5.2 g/dL   GFR 64.77 >60.00 mL/min   Calcium 9.7 8.4 - 10.5 mg/dL  CBC  Result Value Ref Range   WBC 4.9 4.0 - 10.5 K/uL   RBC 4.68 4.22 - 5.81 Mil/uL   Platelets 109.0 Repeated and verified X2. (L) 150.0 - 400.0 K/uL   Hemoglobin 15.3 13.0 - 17.0 g/dL  HCT 44.8 39.0 - 52.0 %   MCV 95.8 78.0 - 100.0 fl   MCHC 34.1 30.0 - 36.0 g/dL   RDW 13.8 11.5 - 15.5 %  Lipid panel  Result Value Ref Range   Cholesterol 131 0 - 200 mg/dL   Triglycerides 161.0 (H) 0.0 - 149.0 mg/dL   HDL 43.90 >39.00 mg/dL   VLDL 32.2 0.0 - 40.0 mg/dL   LDL Cholesterol 55 0 - 99 mg/dL   Total CHOL/HDL Ratio 3    NonHDL 87.54   Hemoglobin A1c  Result Value Ref Range   Hgb A1c MFr Bld 7.6 (H) 4.6 - 6.5 %   *Note: Due to a large number of results and/or encounters for the requested time period, some results have not been displayed. A complete set of results can be found in Results Review.   Assessment & Plan:   Problem List Items Addressed This Visit       Other   Left lower quadrant abdominal pain - Primary    Acute for the last 2 weeks, history of sigmoid diverticulitis last year.  Exam today significantly limited as he could not connect via video. He does sound stable.   Differentials include diverticulitis, GI upset from Metformin dose increase.   Given history, coupled with duration of symptoms and symptom location, will treat for presumed diverticulitis. He is improved today with clear liquid diet, continue same.  Rx for Augmentin course BID x 10 days sent to pharmacy. He did well on this previously, is also on warfarin which limits selection. Discussed ED/return precautions.        Relevant Medications   amoxicillin-clavulanate (AUGMENTIN) 875-125 MG tablet     Meds ordered this encounter  Medications   amoxicillin-clavulanate (AUGMENTIN) 875-125 MG tablet    Sig:  Take 1 tablet by mouth 2 (two) times daily.    Dispense:  20 tablet    Refill:  0    Order Specific Question:   Supervising Provider    Answer:   BEDSOLE, AMY E [[id number]]    No orders of the defined types were placed in this encounter.   I discussed the assessment and treatment plan with the patient. The patient was provided an opportunity to ask questions and all were answered. The patient agreed with the plan and demonstrated an understanding of the instructions. The patient was advised to call back or seek an in-person evaluation if the symptoms worsen or if the condition fails to improve as anticipated.  Follow up plan:  Start Augmentin antibiotics for the infection Take 1 tablet by mouth twice daily for 10 days.  Continue to work on a clear liquid diet until symptoms have improved.  Please notify us in 3-4 days if no improvement. Go to the hospital if you get worse.   It was a pleasure to see you today!   [name], NP

## 2020-10-26 NOTE — Assessment & Plan Note (Addendum)
Acute for the last 2 weeks, history of sigmoid diverticulitis last year.  Exam today significantly limited as he could not connect via video. He does sound stable.   Differentials include diverticulitis, GI upset from Metformin dose increase.   Given history, coupled with duration of symptoms and symptom location, will treat for presumed diverticulitis. He is improved today with clear liquid diet, continue same.  Rx for Augmentin course BID x 10 days sent to pharmacy. He did well on this previously, is also on warfarin which limits selection. Discussed ED/return precautions.

## 2020-10-28 ENCOUNTER — Ambulatory Visit: Payer: Medicare Other

## 2020-11-04 ENCOUNTER — Ambulatory Visit (INDEPENDENT_AMBULATORY_CARE_PROVIDER_SITE_OTHER): Payer: Medicare Other

## 2020-11-04 ENCOUNTER — Other Ambulatory Visit: Payer: Self-pay

## 2020-11-04 DIAGNOSIS — Z7901 Long term (current) use of anticoagulants: Secondary | ICD-10-CM

## 2020-11-04 LAB — POCT INR: INR: 2.6 (ref 2.0–3.0)

## 2020-11-04 NOTE — Patient Instructions (Addendum)
Pre visit review using our clinic review tool, if applicable. No additional management support is needed unless otherwise documented below in the visit note.  Continue taking 2.5mg  daily except take 5mg  on Mondays, Thursdays and Saturdays . Recheck in 4 wks.

## 2020-11-09 ENCOUNTER — Other Ambulatory Visit: Payer: Self-pay | Admitting: Primary Care

## 2020-11-09 DIAGNOSIS — E119 Type 2 diabetes mellitus without complications: Secondary | ICD-10-CM

## 2020-11-15 ENCOUNTER — Telehealth: Payer: Self-pay | Admitting: *Deleted

## 2020-11-15 NOTE — Telephone Encounter (Signed)
Agree with plan for evaluation  As second episode may need imaging

## 2020-11-15 NOTE — Telephone Encounter (Signed)
Spoke to patient by telephone and was advised that he was treated several weeks ago for diverticulitis. Patient stated that he got better with the mediation prescribed. Patient stated he started back with stomach cramps and diarrhea Saturday. Patient stated the pain level at times is a level 6. Patient scheduled for an appointment tomorrow 11/16/20 with Romilda Garret NP at 12:00 noon. Patient stated that he does not feel that he needs to go to an UC or ER now. Patient was given ER precautions and he verbalized understanding.  Discuss with Dr. Einar Pheasant

## 2020-11-15 NOTE — Telephone Encounter (Signed)
PLEASE NOTE: All timestamps contained within this report are represented as [place] Standard Time. CONFIDENTIALTY NOTICE: This fax transmission is intended only for the addressee. It contains information that is legally privileged, confidential or otherwise protected from use or disclosure. If you are not the intended recipient, you are strictly prohibited from reviewing, disclosing, copying using or disseminating any of this information or taking any action in reliance on or regarding this information. If you have received this fax in error, please notify us immediately by telephone so that we can arrange for its return to [place]. Phone: [phone], Toll-Free: [phone], Fax: [phone] Page: 1 of 2 Call Id: [id number] [facility] - Client TELEPHONE ADVICE RECORD [facility] Patient Name: [name] Cook Gender: Male DOB: 08-19-1950 Age: 70 Y 46 M 1 D Return Phone Number: [phone] (Primary) Address: City/ State/ Zip: [place] [address] Client [facility] - Client Client Site [facility] - Day Physician Alma Friendly - NP Contact Type Call Who Is Calling Patient / Member / Family / Caregiver Call Type Triage / Clinical Relationship To Patient Self Return Phone Number [phone] (Primary) Chief Complaint Abdominal Pain Reason for Call Symptomatic / Request for [place] states that he has diverticulitis states he is having stomach cramps and diarrhea. Translation No Nurse Assessment Nurse: Ardine Bjork, RN, Melissa Date/Time (Eastern Time): 11/15/2020 4:18:14 PM Confirm and document reason for call. If symptomatic, describe symptoms. ---Caller states that he has hx Diverticulitis states he is having stomach cramps and diarrhea. Abd pain started Sat and diarrhea started Sat-diarrhea worse today. Diarrhea x 3 past 24 hrs. Intermittent abd pain. Denies fever, cough or sob or  vomiting. Does the patient have any new or worsening symptoms? ---Yes Will a triage be completed? ---Yes Related visit to physician within the last 2 weeks? ---No Does the PT have any chronic conditions? (i.e. diabetes, asthma, this includes High risk factors for pregnancy, etc.) ---Yes List chronic conditions. ---Hx Diverticulitis.CAD, DM Type 2. Is this a behavioral health or substance abuse call? ---No Guidelines Guideline Title Affirmed Question Affirmed Notes Nurse Date/Time (Eastern Time) Diarrhea [1] Constant abdominal pain AND [2] present > 2 hours Zayas, RN, Melissa 11/15/2020 4:21:11 PM Disp. Time Eilene Ghazi Time) Disposition Final User 11/15/2020 4:23:30 PM See HCP within 4 Hours (or PCP triage) Yes Zayas, RN, Melissa PLEASE NOTE: All timestamps contained within this report are represented as [place] Standard Time. CONFIDENTIALTY NOTICE: This fax transmission is intended only for the addressee. It contains information that is legally privileged, confidential or otherwise protected from use or disclosure. If you are not the intended recipient, you are strictly prohibited from reviewing, disclosing, copying using or disseminating any of this information or taking any action in reliance on or regarding this information. If you have received this fax in error, please notify us immediately by telephone so that we can arrange for its return to [place]. Phone: [phone], Toll-Free: [phone], Fax: [phone] Page: 2 of 2 Call Id: [id number] [place] Disagree/Comply Comply Caller Understands Yes PreDisposition Did not know what to do Care Advice Given Per Guideline SEE HCP (OR PCP TRIAGE) WITHIN 4 HOURS: * IF OFFICE WILL BE OPEN: You need to be seen within the next 3 or 4 hours. Call your doctor (or NP/PA) now or as soon as the office opens. CALL BACK IF: * You become worse CARE ADVICE given per Diarrhea (Adult) guideline. Comments User: Dub Mikes, RN Date/Time Eilene Ghazi Time):  11/15/2020 4:25:41  PM Pt states is 5 min from office and would like to know if appt this late in the day-informed if no appts to go to UC User: Dub Mikes, RN Date/Time (Eastern Time): 11/15/2020 4:26:36 PM Pt transferred to appt line-advised if no appts to go to UC within 3-4 hours-pt verbalizes understanding/appr. User: Dub Mikes, RN Date/Time Eilene Ghazi Time): 11/15/2020 4:28:20 PM Error: Nurse noticed directive is to call backline and not appt line-rang numerous times and then busy signal-will call back again now. User: Dub Mikes, RN Date/Time Eilene Ghazi Time): 11/15/2020 4:32:51 PM Call to backline-spoke with Nurse Rena-Report given and nurse made an error in connecting pt to appt line-states no appt avail.Advised pt to be seen in UC. Referrals REFERRED TO PCP OFFICE

## 2020-11-16 ENCOUNTER — Other Ambulatory Visit: Payer: Self-pay

## 2020-11-16 ENCOUNTER — Encounter: Payer: Self-pay | Admitting: Nurse Practitioner

## 2020-11-16 ENCOUNTER — Ambulatory Visit (INDEPENDENT_AMBULATORY_CARE_PROVIDER_SITE_OTHER): Payer: Medicare Other | Admitting: Nurse Practitioner

## 2020-11-16 VITALS — BP 92/60 | HR 72 | Temp 97.8°F | Ht 71.0 in | Wt 223.0 lb

## 2020-11-16 DIAGNOSIS — R1032 Left lower quadrant pain: Secondary | ICD-10-CM

## 2020-11-16 DIAGNOSIS — K5792 Diverticulitis of intestine, part unspecified, without perforation or abscess without bleeding: Secondary | ICD-10-CM | POA: Diagnosis not present

## 2020-11-16 LAB — COMPREHENSIVE METABOLIC PANEL
ALT: 20 U/L (ref 0–53)
AST: 21 U/L (ref 0–37)
Albumin: 4.2 g/dL (ref 3.5–5.2)
Alkaline Phosphatase: 44 U/L (ref 39–117)
BUN: 19 mg/dL (ref 6–23)
CO2: 25 mEq/L (ref 19–32)
Calcium: 9.8 mg/dL (ref 8.4–10.5)
Chloride: 105 mEq/L (ref 96–112)
Creatinine, Ser: 1.11 mg/dL (ref 0.40–1.50)
GFR: 68.24 mL/min (ref >60.00)
Glucose, Bld: 212 mg/dL — ABNORMAL HIGH (ref 70–99)
Potassium: 4 mEq/L (ref 3.5–5.1)
Sodium: 139 mEq/L (ref 135–145)
Total Bilirubin: 0.6 mg/dL (ref 0.2–1.2)
Total Protein: 6.6 g/dL (ref 6.0–8.3)

## 2020-11-16 LAB — CBC
HCT: 43.9 % (ref 39.0–52.0)
Hemoglobin: 14.6 g/dL (ref 13.0–17.0)
MCHC: 33.2 g/dL (ref 30.0–36.0)
MCV: 97.9 fl (ref 78.0–100.0)
Platelets: 118 10*3/uL — ABNORMAL LOW (ref 150.0–400.0)
RBC: 4.49 Mil/uL (ref 4.22–5.81)
RDW: 14.5 % (ref 11.5–15.5)
WBC: 4.7 10*3/uL (ref 4.0–10.5)

## 2020-11-16 MED ORDER — AMOXICILLIN-POT CLAVULANATE 875-125 MG PO TABS: 1.0000 | ORAL_TABLET | Freq: Two times a day (BID) | ORAL | 0 refills | Status: DC

## 2020-11-16 NOTE — Progress Notes (Signed)
Acute Office Visit  Subjective:    Patient ID: [name] Cook, male    DOB: [name] 26, 1951, 70 y.o.   MRN: [id number]  Chief Complaint  Patient presents with   Abdominal Pain   Diarrhea     Patient is in today for abdominal pain that started saturday. States stable in progression. Does have periods of waxing and waning.. States he has been having diarrhea along with the pain 3 episodes yesterday and one episode today. Eating does seem to make the pain worse.Denies fever, chills. Does state that he has DOE as of the past week. History of diverticulitis dx in ED visit 01/2020. Was treated. Recently seen for the same approx 1 month ago by PCP. He has not tried anything over the counter for pain management.  Past Medical History:  Diagnosis Date   Arthritis    "knees and lower back" (03/14/2013)   Atrial flutter ([place])    radiofrequency ablation in 2001   CAD (coronary artery disease)    a. Nonobstructive. Cardiac cath in 2001-50% mid RI, normal LM, LAD, RCA b. cath 10/16/2014 95% mid RCA treated with DES, 99% ost D1 medical management due to small aneurysmal segment   Chronic anticoagulation    chronic Coumadin anticoagulation   Chronic obstructive pulmonary disease ([place]) 04/20/2011   Diabetes mellitus, type 2 (HCC)    GERD (gastroesophageal reflux disease)    Hyperlipidemia    Hypertension    with hypertensive heart disease   Left knee pain 10/25/2017   medial   Obesity    Persistent atrial fibrillation ([place])    recurrent atrial flutter since 2001 s/p DCCVs, multiple failed AADs, h/o tachy-mediated cardiomyopathy   Shortness of breath    "can come on at any time" (03/14/2013)   Sleep apnea    "dx'd; couldn't wear the mask" (03/14/2013)   Tobacco abuse     Past Surgical History:  Procedure Laterality Date   ATRIAL FLUTTER ABLATION  2002   atrial flutter; subsequently developed atrial fibrillation   AV NODE ABLATION  01/24/2013   CARDIAC CATHETERIZATION  2002   CARDIAC  CATHETERIZATION N/A 10/16/2014   Procedure: Left Heart Cath and Coronary Angiography;  Surgeon: [name], MD;  Location: [facility];  Service: Cardiovascular;  Laterality: N/A;   CARDIOVERSION  05/31/2011   Procedure: CARDIOVERSION;  Surgeon: Cristopher Estimable. Lattie Haw, MD;  Location: [facility];  Service: Cardiovascular;  Laterality: N/A;   CARPAL TUNNEL RELEASE Left 1980's   COLONOSCOPY WITH PROPOFOL N/A 12/30/2018   Procedure: COLONOSCOPY WITH PROPOFOL;  Surgeon: Jonathon Bellows, MD;  Location: [facility] ENDOSCOPY;  Service: Gastroenterology;  Laterality: N/A;   COLONOSCOPY WITH PROPOFOL N/A 12/04/2019   Procedure: COLONOSCOPY WITH PROPOFOL;  Surgeon: Jonathon Bellows, MD;  Location: [facility] ENDOSCOPY;  Service: Gastroenterology;  Laterality: N/A;   INSERT / REPLACE / REMOVE PACEMAKER  01/24/2013    Medtronic Adapta L dual-chamber pacemaker, serial number [id number]    LEFT HEART CATH AND CORONARY ANGIOGRAPHY N/A 06/07/2018   Procedure: LEFT HEART CATH AND CORONARY ANGIOGRAPHY;  Surgeon: [name], MD;  Location: [facility];  Service: Cardiovascular;  Laterality: N/A;   LEFT HEART CATHETERIZATION WITH CORONARY ANGIOGRAM N/A 03/17/2013   Procedure: LEFT HEART CATHETERIZATION WITH CORONARY ANGIOGRAM;  Surgeon: Burnell Blanks, MD; LAD mild dz, D1 branch 100%, inferior branch 99%, CFX OK, RCA 50%, EF 65%     LOOP RECORDER IMPLANT  2002   PERMANENT PACEMAKER INSERTION N/A 01/24/2013   Procedure: PERMANENT PACEMAKER INSERTION;  Surgeon: [name], MD;  [facility] C[facility] LAB;  Service: Cardiovascular;  Laterality: N/A;   TIBIAL TUBERCLERPLASTY  ~ 2003    Family History  Problem Relation Age of Onset   Alzheimer's disease Mother    Osteoporosis Mother     Social History   Socioeconomic History   Marital status: Widowed    Spouse name: Not on file   Number of children: 1   Years of education: Not on file   Highest education level: Not on file  Occupational History   Occupation:  Unemployed    Employer: UNEMPLOYED  Tobacco Use   Smoking status: Former    Packs/day: 1.00    Years: 42.00    Pack years: 42.00    Types: Cigarettes    Quit date: 12/31/2011    Years since quitting: 8.8   Smokeless tobacco: Never  Vaping Use   Vaping Use: Never used  Substance and Sexual Activity   Alcohol use: Not Currently    Comment: 03/14/2013 "stopped drinking back in 2002; never had problem w/it"   Drug use: No   Sexual activity: Not Currently  Other Topics Concern   Not on file  Social History Narrative   Single.   Retired.    1 son, deceased.    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   Enjoys playing on the computer.       Social Determinants of Health   Financial Resource Strain: Low Risk    Difficulty of Paying Living Expenses: Not hard at all  Food Insecurity: No Food Insecurity   Worried About Charity fundraiser in the Last Year: Never true   [place] in the Last Year: Never true  Transportation Needs: No Transportation Needs   Lack of Transportation (Medical): No   Lack of Transportation (Non-Medical): No  Physical Activity: Inactive   Days of Exercise per Week: 0 days   Minutes of Exercise per Session: 0 min  Stress: No Stress Concern Present   Feeling of Stress : Not at all  Social Connections: Not on file  Intimate Partner Violence: Not At Risk   Fear of Current or Ex-Partner: No   Emotionally Abused: No   Physically Abused: No   Sexually Abused: No    Outpatient Medications Prior to Visit  Medication Sig Dispense Refill   albuterol (VENTOLIN HFA) 108 (90 Base) MCG/ACT inhaler INHALE 2 PUFFS BY MOUTH EVERY 4 HOURS AS NEEDED FOR WHEEZE OR FOR SHORTNESS OF BREATH 6.7 each 0   citalopram (CELEXA) 40 MG tablet TAKE 1 TABLET (40 MG TOTAL) BY MOUTH DAILY. FOR DEPRESSION. 90 tablet 1   clopidogrel (PLAVIX) 75 MG tablet TAKE 1 TABLET BY MOUTH EVERY DAY 90 tablet 3   diclofenac Sodium (VOLTAREN) 1 % GEL  Apply 2 g topically 4 (four) times daily. 50 g 0   empagliflozin (JARDIANCE) 10 MG TABS tablet TAKE 1 TABLET BY MOUTH EVERY MORNING FOR DIABETES 90 tablet 3   fish oil-omega-3 fatty acids 1000 MG capsule Take 1 g by mouth daily.       fluticasone (FLONASE) 50 MCG/ACT nasal spray PLACE 1 SPRAY INTO BOTH NOSTRILS DAILY AS NEEDED FOR ALLERGIES OR RHINITIS. 48 mL 3   furosemide (LASIX) 20 MG tablet TAKE 1 TABLET BY MOUTH EVERY DAY 90 tablet 3   gabapentin (NEURONTIN) 300 MG capsule Take 1 capsule (300 mg total) by mouth 2 (two) times daily. For neuropathy 180 capsule 3  glucose blood (ONE TOUCH ULTRA TEST) test strip USE TO TEST UP TO 4 TIMES DAILY 100 each 2   isosorbide mononitrate (IMDUR) 60 MG 24 hr tablet TAKE 1 TABLET BY MOUTH EVERY DAY 90 tablet 3   lisinopril (ZESTRIL) 2.5 MG tablet TAKE 1 TABLET BY MOUTH EVERY DAY 90 tablet 3   metFORMIN (GLUCOPHAGE) 500 MG tablet Take 2 tablets by mouth every morning with breakfast and 1 take tablet by mouth every evening with dinner for diabetes 270 tablet 1   metoprolol succinate (TOPROL-XL) 25 MG 24 hr tablet Take 0.5 tablets (12.5 mg total) by mouth daily. 45 tablet 3   nitroGLYCERIN (NITROSTAT) 0.4 MG SL tablet Place 1 tablet (0.4 mg total) under the tongue every 5 (five) minutes as needed for chest pain. 25 tablet 0   pantoprazole (PROTONIX) 40 MG tablet TAKE 1 TABLET (40 MG TOTAL) BY MOUTH DAILY FOR HEARTBURN. 90 tablet 3   rosuvastatin (CRESTOR) 20 MG tablet TAKE 1 TABLET BY MOUTH EVERY DAY IN THE EVENING FOR CHOLESTEROL 90 tablet 0   sitaGLIPtin (JANUVIA) 100 MG tablet TAKE 1 TABLET BY MOUTH ONCE DAILY FOR DIABETES 90 tablet 0   SYMBICORT 160-4.5 MCG/ACT inhaler INHALE 2 PUFFS INTO THE LUNGS TWICE A DAY 10.2 each 3   tamsulosin (FLOMAX) 0.4 MG CAPS capsule TAKE 1 CAPSULE (0.4 MG TOTAL) BY MOUTH 2 (TWO) TIMES DAILY. 180 capsule 3   warfarin (COUMADIN) 5 MG tablet TAKE 1/2 TABLET DAILY EXCEPT TAKE 1 TABLET ON MONDAYS, THURSDAYS & Saturdays OR AS  DIRECTED BY COUMADIN CLINIC 90 tablet 0   amoxicillin-clavulanate (AUGMENTIN) 875-125 MG tablet Take 1 tablet by mouth 2 (two) times daily. 20 tablet 0   No facility-administered medications prior to visit.    No Known Allergies  Review of Systems  Constitutional:  Negative for chills and fever.  Respiratory:  Positive for shortness of breath.   Cardiovascular:  Negative for chest pain and leg swelling.  Gastrointestinal:  Positive for abdominal pain and diarrhea. Negative for blood in stool, nausea and vomiting.  Genitourinary:  Negative for hematuria.  Skin:  Negative for pallor.  Neurological:  Negative for dizziness and weakness.  Hematological:  Bruises/bleeds easily.      Objective:    Physical Exam Vitals and nursing note reviewed.  Constitutional:      Appearance: He is obese.  Cardiovascular:     Rate and Rhythm: Normal rate and regular rhythm.     Heart sounds: No murmur heard. Pulmonary:     Effort: Pulmonary effort is normal.     Breath sounds: Normal breath sounds.  Abdominal:     General: Abdomen is protuberant. Bowel sounds are normal.     Palpations: Abdomen is soft. There is no hepatomegaly, splenomegaly or mass.     Tenderness: There is abdominal tenderness in the left lower quadrant. There is no guarding or rebound.     Comments: Baseline abdomen per patient  Neurological:     Mental Status: He is alert.    BP 92/60   Pulse 72   Temp 97.8 F (36.6 C) (Temporal)   Ht '5\' 11"'$  (1.803 m)   Wt 223 lb (101.2 kg)   SpO2 92%   BMI 31.10 kg/m  Wt Readings from Last 3 Encounters:  11/16/20 223 lb (101.2 kg)  10/26/20 231 lb (104.8 kg)  10/07/20 231 lb (104.8 kg)    Health Maintenance Due  Topic Date Due   PNA vac Low Risk Adult (2 of 2 -  PPSV23) 09/22/2018   FOOT EXAM  10/08/2020   INFLUENZA VACCINE  11/15/2020    There are no preventive care reminders to display for this patient.   Lab Results  Component Value Date   TSH 2.030 04/15/2019    Lab Results  Component Value Date   WBC 4.9 10/07/2020   HGB 15.3 10/07/2020   HCT 44.8 10/07/2020   MCV 95.8 10/07/2020   PLT 109.0 Repeated and verified X2. (L) 10/07/2020   Lab Results  Component Value Date   NA 139 10/07/2020   K 4.0 10/07/2020   CO2 26 10/07/2020   GLUCOSE 194 (H) 10/07/2020   BUN 26 (H) 10/07/2020   CREATININE 1.16 10/07/2020   BILITOT 0.6 10/07/2020   ALKPHOS 51 10/07/2020   AST 21 10/07/2020   ALT 20 10/07/2020   PROT 6.5 10/07/2020   ALBUMIN 4.3 10/07/2020   CALCIUM 9.7 10/07/2020   ANIONGAP 11 01/23/2020   GFR 64.77 10/07/2020   Lab Results  Component Value Date   CHOL 131 10/07/2020   Lab Results  Component Value Date   HDL 43.90 10/07/2020   Lab Results  Component Value Date   LDLCALC 55 10/07/2020   Lab Results  Component Value Date   TRIG 161.0 (H) 10/07/2020   Lab Results  Component Value Date   CHOLHDL 3 10/07/2020   Lab Results  Component Value Date   HGBA1C 7.6 (H) 10/07/2020       Assessment & Plan:   Problem List Items Addressed This Visit       Digestive   Acute diverticulitis    Patient has history of the same.  States feels similar pain is just not as bad.  Had discussion with patient about imaging and signs and symptoms to be aware of and when to seek emergency room care.  Given exam and patient's symptoms will defer imaging at current time.  We will have him follow-up in approximately 5 to 6 days to see if he is improving.  If not we can consider imaging and further steps.  We will draw labs in office today.  Pending results. Start Augmentin twice daily for next 10 days.       Relevant Medications   amoxicillin-clavulanate (AUGMENTIN) 875-125 MG tablet     Other   Left lower quadrant abdominal pain - Primary    States started Saturday has been stable and progression.  History of the same with diagnosis of diverticulitis.  Has used Augmentin in the past with success.  We will draw labs in office.   Pending results       Relevant Orders   CBC   Comprehensive metabolic panel   Other Visit Diagnoses     Diverticulitis       Relevant Medications   amoxicillin-clavulanate (AUGMENTIN) 875-125 MG tablet        No orders of the defined types were placed in this encounter.  This visit occurred during the SARS-CoV-2 public health emergency.  Safety protocols were in place, including screening questions prior to the visit, additional usage of staff PPE, and extensive cleaning of exam room while observing appropriate contact time as indicated for disinfecting solutions.   Romilda Garret, NP

## 2020-11-16 NOTE — Patient Instructions (Signed)
Take medication as prescribed If your symptoms do not improve after 2-3 days of antibiotic treatment you need to call the office and let us know If you develop, fever, chills, increased abdominal pain, or noticed blood in your stool you need to be evaluated in the emergency department

## 2020-11-16 NOTE — Assessment & Plan Note (Signed)
States started Saturday has been stable and progression.  History of the same with diagnosis of diverticulitis.  Has used Augmentin in the past with success.  We will draw labs in office.  Pending results

## 2020-11-16 NOTE — Assessment & Plan Note (Addendum)
Patient has history of the same.  States feels similar pain is just not as bad.  Had discussion with patient about imaging and signs and symptoms to be aware of and when to seek emergency room care.  Given exam and patient's symptoms will defer imaging at current time.  We will have him follow-up in approximately 5 to 6 days to see if he is improving.  If not we can consider imaging and further steps.  We will draw labs in office today.  Pending results. Start Augmentin twice daily for next 10 days.

## 2020-11-18 ENCOUNTER — Telehealth: Payer: Self-pay | Admitting: Nurse Practitioner

## 2020-11-18 NOTE — Telephone Encounter (Signed)
I saw this gentlemen this week and put him on augmentin. Just want to give you a heads up so if he needs to come in sooner for an INR check we can make it happen.  Thanks, Romilda Garret, DNP, AGNP-C

## 2020-11-19 ENCOUNTER — Telehealth: Payer: Self-pay | Admitting: Primary Care

## 2020-11-19 ENCOUNTER — Other Ambulatory Visit: Payer: Self-pay | Admitting: Primary Care

## 2020-11-19 DIAGNOSIS — J439 Emphysema, unspecified: Secondary | ICD-10-CM

## 2020-11-19 NOTE — Telephone Encounter (Signed)
  Encourage patient to contact the pharmacy for refills or they can request refills through [place]:  Please schedule appointment if longer than 1 year  NEXT APPOINTMENT DATE: 11/22/20  MEDICATION: glucose blood (ONE TOUCH ULTRA TEST) test strip  Is the patient out of medication? yes  PHARMACY: [facility] [email or web address]  Let patient know to contact pharmacy at the end of the day to make sure medication is ready.  Please notify patient to allow 48-72 hours to process  CLINICAL FILLS OUT ALL BELOW:   LAST REFILL:  QTY:  REFILL DATE:    OTHER COMMENTS:    Okay for refill?  Please advise

## 2020-11-19 NOTE — Telephone Encounter (Signed)
Pt will have INR checked when in office for OV on 8/8 at 11.

## 2020-11-22 ENCOUNTER — Other Ambulatory Visit: Payer: Self-pay

## 2020-11-22 ENCOUNTER — Ambulatory Visit: Payer: Medicare Other

## 2020-11-22 ENCOUNTER — Ambulatory Visit
Admission: RE | Admit: 2020-11-22 | Discharge: 2020-11-22 | Disposition: A | Discharge status: Home / Self Care | Payer: Medicare Other | Attending: Nurse Practitioner | Admitting: Nurse Practitioner

## 2020-11-22 ENCOUNTER — Ambulatory Visit
Admission: RE | Admit: 2020-11-22 | Discharge: 2020-11-22 | Disposition: A | Discharge status: Home / Self Care | Payer: Medicare Other | Source: Ambulatory Visit | Attending: Nurse Practitioner | Admitting: Nurse Practitioner

## 2020-11-22 ENCOUNTER — Ambulatory Visit (INDEPENDENT_AMBULATORY_CARE_PROVIDER_SITE_OTHER): Payer: Medicare Other | Admitting: Nurse Practitioner

## 2020-11-22 ENCOUNTER — Ambulatory Visit (INDEPENDENT_AMBULATORY_CARE_PROVIDER_SITE_OTHER): Payer: Medicare Other

## 2020-11-22 ENCOUNTER — Encounter: Payer: Self-pay | Admitting: Nurse Practitioner

## 2020-11-22 VITALS — BP 92/60 | HR 81 | Temp 97.5°F | Resp 14 | Ht 71.0 in | Wt 228.0 lb

## 2020-11-22 DIAGNOSIS — Z7901 Long term (current) use of anticoagulants: Secondary | ICD-10-CM

## 2020-11-22 DIAGNOSIS — R0789 Other chest pain: Secondary | ICD-10-CM | POA: Insufficient documentation

## 2020-11-22 DIAGNOSIS — R1032 Left lower quadrant pain: Secondary | ICD-10-CM

## 2020-11-22 DIAGNOSIS — I4819 Other persistent atrial fibrillation: Secondary | ICD-10-CM

## 2020-11-22 DIAGNOSIS — K5792 Diverticulitis of intestine, part unspecified, without perforation or abscess without bleeding: Secondary | ICD-10-CM | POA: Diagnosis not present

## 2020-11-22 DIAGNOSIS — R079 Chest pain, unspecified: Secondary | ICD-10-CM | POA: Diagnosis not present

## 2020-11-22 LAB — POCT INR: INR: 2.7 (ref 2.0–3.0)

## 2020-11-22 MED ORDER — GLUCOSE BLOOD VI STRP
ORAL_STRIP | 2 refills | Status: DC
Start: 2020-11-22 — End: 2021-03-24

## 2020-11-22 NOTE — Telephone Encounter (Signed)
Refill sent in no further action needed at this time.

## 2020-11-22 NOTE — Patient Instructions (Addendum)
Pre visit review using our clinic review tool, if applicable. No additional management support is needed unless otherwise documented below in the visit note.  Continue taking 2.'5mg'$  daily except take '5mg'$  on Mondays, Thursdays and Saturdays . Recheck in 4 wks.

## 2020-11-22 NOTE — Assessment & Plan Note (Signed)
Patient currently on Coumadin.  Since recent antibiotic treatment we will check INR today pending results and review we will determine next INR check.  Patient aware

## 2020-11-22 NOTE — Assessment & Plan Note (Signed)
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  Has been stable since incident on 8-09/2020.  Will elect for chest x-ray to make sure no bony abnormality noted and lungs unaffected.  X-ray ordered pending result.

## 2020-11-22 NOTE — Progress Notes (Signed)
Electrophysiology Office Note Date: 11/23/2020  ID:  [name] Cook, DOB September 18, 1950, MRN [id number]  PCP: [name], NP Primary Cardiologist: [name], MD Electrophysiologist: [name], MD   CC: Pacemaker follow-up  [name] Cook is a 70 y.o. male seen today for [name], MD for routine electrophysiology followup.    Seen by PCP 8/8 after lying down and feeling a "pop" in his chest. CXR unremarkable. At the time, he was lying on his stomach working in his [employer] when he felt the pop. He has had chest discomfort with lying on his left side, less so on his back. He does have mild pain with deep breathing. He does not have chest wall tenderness. No syncope.  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 Nonobstructive. Cardiac cath in 2001-50% mid RI, normal LM, LAD, RCA b. cath 10/16/2014 95% mid RCA treated with DES, 99% ost D1 medical management due to small aneurysmal segment   Chronic anticoagulation    chronic Coumadin anticoagulation   Chronic obstructive pulmonary disease ([place]) 04/20/2011   Diabetes mellitus, type 2 (HCC)    GERD (gastroesophageal reflux disease)    Hyperlipidemia    Hypertension    with hypertensive heart disease   Left knee pain 10/25/2017   medial   Obesity    Persistent atrial fibrillation ([place])    recurrent atrial flutter since 2001 s/p DCCVs, multiple failed AADs, h/o tachy-mediated cardiomyopathy   Shortness of breath    "can come on at any time" (03/14/2013)   Sleep apnea    "dx'd; couldn't wear the mask" (03/14/2013)   Tobacco abuse    Past Surgical History:  Procedure  Laterality Date   ATRIAL FLUTTER ABLATION  2002   atrial flutter; subsequently developed atrial fibrillation   AV NODE ABLATION  01/24/2013   CARDIAC CATHETERIZATION  2002   CARDIAC CATHETERIZATION N/A 10/16/2014   Procedure: Left Heart Cath and Coronary Angiography;  Surgeon: [name], MD;  Location: [place] [facility];  Service: Cardiovascular;  Laterality: N/A;   CARDIOVERSION  05/31/2011   Procedure: CARDIOVERSION;  Surgeon: Cristopher Estimable. Lattie Haw, MD;  Location: [facility];  Service: Cardiovascular;  Laterality: N/A;   CARPAL TUNNEL RELEASE Left 1980's   COLONOSCOPY WITH PROPOFOL N/A 12/30/2018   Procedure: COLONOSCOPY WITH PROPOFOL;  Surgeon: Jonathon Bellows, MD;  Location: [facility] ENDOSCOPY;  Service: Gastroenterology;  Laterality: N/A;   COLONOSCOPY WITH PROPOFOL N/A 12/04/2019   Procedure: COLONOSCOPY WITH PROPOFOL;  Surgeon: Jonathon Bellows, MD;  Location: [facility] ENDOSCOPY;  Service: Gastroenterology;  Laterality: N/A;   INSERT / REPLACE / REMOVE PACEMAKER  01/24/2013    Medtronic Adapta L dual-chamber pacemaker, serial number [id number]    LEFT HEART CATH AND CORONARY ANGIOGRAPHY N/A 06/07/2018   Procedure: LEFT HEART CATH AND CORONARY ANGIOGRAPHY;  Surgeon: [name], MD;  Location: [place] [facility];  Service: Cardiovascular;  Laterality: N/A;   LEFT HEART CATHETERIZATION WITH CORONARY ANGIOGRAM N/A 03/17/2013   Procedure: LEFT HEART CATHETERIZATION WITH CORONARY ANGIOGRAM;  Surgeon: Burnell Blanks, MD; LAD mild dz, D1 branch 100%, inferior branch  99%, CFX OK, RCA 50%, EF 65%     LOOP RECORDER IMPLANT  2002   PERMANENT PACEMAKER INSERTION N/A 01/24/2013   Procedure: PERMANENT PACEMAKER INSERTION;  Surgeon: [name], MD;  Location: [facility] CATH LAB;  Service: Cardiovascular;  Laterality: N/A;   TIBIAL TUBERCLERPLASTY  ~ 2003    Current Outpatient Medications  Medication Sig Dispense Refill   albuterol (VENTOLIN HFA) 108 (90 Base) MCG/ACT inhaler INHALE 2 PUFFS BY MOUTH EVERY 4  HOURS AS NEEDED FOR WHEEZE OR FOR SHORTNESS OF BREATH 6.7 each 0   amoxicillin-clavulanate (AUGMENTIN) 875-125 MG tablet Take 1 tablet by mouth 2 (two) times daily. 20 tablet 0   citalopram (CELEXA) 40 MG tablet TAKE 1 TABLET (40 MG TOTAL) BY MOUTH DAILY. FOR DEPRESSION. 90 tablet 1   clopidogrel (PLAVIX) 75 MG tablet TAKE 1 TABLET BY MOUTH EVERY DAY 90 tablet 3   diclofenac Sodium (VOLTAREN) 1 % GEL Apply 2 g topically 4 (four) times daily. 50 g 0   empagliflozin (JARDIANCE) 10 MG TABS tablet TAKE 1 TABLET BY MOUTH EVERY MORNING FOR DIABETES 90 tablet 3   fish oil-omega-3 fatty acids 1000 MG capsule Take 1 g by mouth daily.       fluticasone (FLONASE) 50 MCG/ACT nasal spray PLACE 1 SPRAY INTO BOTH NOSTRILS DAILY AS NEEDED FOR ALLERGIES OR RHINITIS. 48 mL 3   furosemide (LASIX) 20 MG tablet TAKE 1 TABLET BY MOUTH EVERY DAY 90 tablet 3   gabapentin (NEURONTIN) 300 MG capsule Take 1 capsule (300 mg total) by mouth 2 (two) times daily. For neuropathy 180 capsule 3   glucose blood (ONE TOUCH ULTRA TEST) test strip USE TO TEST UP TO 4 TIMES DAILY 100 each 2   isosorbide mononitrate (IMDUR) 60 MG 24 hr tablet TAKE 1 TABLET BY MOUTH EVERY DAY 90 tablet 3   lisinopril (ZESTRIL) 2.5 MG tablet TAKE 1 TABLET BY MOUTH EVERY DAY 90 tablet 3   metFORMIN (GLUCOPHAGE) 500 MG tablet Take 2 tablets by mouth every morning with breakfast and 1 take tablet by mouth every evening with dinner for diabetes 270 tablet 1   metoprolol succinate (TOPROL-XL) 25 MG 24 hr tablet Take 0.5 tablets (12.5 mg total) by mouth daily. 45 tablet 3   nitroGLYCERIN (NITROSTAT) 0.4 MG SL tablet Place 1 tablet (0.4 mg total) under the tongue every 5 (five) minutes as needed for chest pain. 25 tablet 0   pantoprazole (PROTONIX) 40 MG tablet TAKE 1 TABLET (40 MG TOTAL) BY MOUTH DAILY FOR HEARTBURN. 90 tablet 3   rosuvastatin (CRESTOR) 20 MG tablet TAKE 1 TABLET BY MOUTH EVERY DAY IN THE EVENING FOR CHOLESTEROL 90 tablet 0   sitaGLIPtin  (JANUVIA) 100 MG tablet TAKE 1 TABLET BY MOUTH ONCE DAILY FOR DIABETES 90 tablet 0   SYMBICORT 160-4.5 MCG/ACT inhaler INHALE 2 PUFFS INTO THE LUNGS TWICE A DAY 10.2 each 3   tamsulosin (FLOMAX) 0.4 MG CAPS capsule TAKE 1 CAPSULE (0.4 MG TOTAL) BY MOUTH 2 (TWO) TIMES DAILY. 180 capsule 3   warfarin (COUMADIN) 5 MG tablet TAKE 1/2 TABLET DAILY EXCEPT TAKE 1 TABLET ON MONDAYS, THURSDAYS & Saturdays OR AS DIRECTED BY COUMADIN CLINIC 90 tablet 0   No current facility-administered medications for this visit.    Allergies:   Patient has no known allergies.   Social History: Social History   Socioeconomic History   Marital status: Widowed    Spouse name: Not on file   Number of children: 1   Years  of education: Not on file   Highest education level: Not on file  Occupational History   Occupation: Unemployed    Employer: UNEMPLOYED  Tobacco Use   Smoking status: Former    Packs/day: 1.00    Years: 42.00    Pack years: 42.00    Types: Cigarettes    Quit date: 12/31/2011    Years since quitting: 8.9   Smokeless tobacco: Never  Vaping Use   Vaping Use: Never used  Substance and Sexual Activity   Alcohol use: Not Currently    Comment: 03/14/2013 "stopped drinking back in 2002; never had problem w/it"   Drug use: No   Sexual activity: Not Currently  Other Topics Concern   Not on file  Social History Narrative   Single.   Retired.    1 son, deceased.    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   Enjoys playing on the computer.       Social Determinants of Health   Financial Resource Strain: Low Risk    Difficulty of Paying Living Expenses: Not hard at all  Food Insecurity: No Food Insecurity   Worried About Charity fundraiser in the Last Year: Never true   [employer] in the Last Year: Never true  Transportation Needs: No Transportation Needs   Lack of Transportation (Medical): No   Lack of Transportation (Non-Medical): No   Physical Activity: Inactive   Days of Exercise per Week: 0 days   Minutes of Exercise per Session: 0 min  Stress: No Stress Concern Present   Feeling of Stress : Not at all  Social Connections: Not on file  Intimate Partner Violence: Not At Risk   Fear of Current or Ex-Partner: No   Emotionally Abused: No   Physically Abused: No   Sexually Abused: No    Family History: Family History  Problem Relation Age of Onset   Alzheimer's disease Mother    Osteoporosis Mother      Review of Systems: All other systems reviewed and are otherwise negative except as noted above.  Physical Exam: Vitals:   11/23/20 1046  BP: 112/62  Pulse: 88  SpO2: 96%  Weight: 228 lb 12.8 oz (103.8 kg)  Height: '5\' 11"'$  (1.803 m)     GEN- The patient is well appearing, alert and oriented x 3 today.   HEENT: normocephalic, atraumatic; sclera clear, conjunctiva pink; hearing intact; oropharynx clear; neck supple  Lungs- Clear to ausculation bilaterally, normal work of breathing.  No wheezes, rales, rhonchi Heart- Regular rate and rhythm, no murmurs, rubs or gallops  GI- soft, non-tender, non-distended, bowel sounds present  Extremities- no clubbing or cyanosis. No edema MS- no significant deformity or atrophy Skin- warm and dry, no rash or lesion; PPM pocket well healed Psych- euthymic mood, full affect Neuro- strength and sensation are intact  PPM Interrogation- reviewed in detail today,  See PACEART report  EKG:  EKG is not ordered today.  Recent Labs: 01/23/2020: B Natriuretic Peptide 70.1 11/16/2020: ALT 20; BUN 19; Creatinine, Ser 1.11; Hemoglobin 14.6; Platelets 118.0; Potassium 4.0; Sodium 139   Wt Readings from Last 3 Encounters:  11/23/20 228 lb 12.8 oz (103.8 kg)  11/22/20 228 lb (103.4 kg)  11/16/20 223 lb (101.2 kg)     Other studies Reviewed: Additional studies/ records that were reviewed today include: Previous EP office notes, Previous remote checks, Most recent labwork.    Assessment and Plan:  1.  Permanent AF  s/p  AV nodal ablation and Medtronic  PPM  Normal PPM function See Pace Art report No changes today Continue coumadin for CHA2DS2-VASc of at least 5.  2. CAD Denies s/s ischemia Has known branch vessel disease and patent stent by cath Feb 2020r Continue on plavix, nitrate, BB, statin   4. Chronic CHF (combined)  LVEF by evaluation Feb 2020 was 45-50%  NYHA II-III symptoms and volume status stable on exam today.  His DOE remains unchanged for a couple years, known COPD Continue on BB/ACE, furosemide Unlikely to tolerate ARNi with h/o hypotension. Will update echo in setting of pericarditic symptoms.    5. HTN Stable on current regimen today.   6. Pericarditic type symptoms He is on ABX for diverticulitis symptoms, but otherwise no recent viral type illness.  Will update echo to make sure know effusion. His lead parameters are stable and positioning is stable on CXR. Reviewed with Dr. Quentin Ore who agrees. Reviewed alarm symptoms.  Current medicines are reviewed at length with the patient today.   The patient does not have concerns regarding his medicines.  The following changes were made today:  none  Labs/ tests ordered today include:  Orders Placed This Encounter  Procedures   ECHOCARDIOGRAM COMPLETE   Disposition:   Follow up with [name] in 6 Months. Sooner pending Echo results   Signed, Annamaria Helling  11/23/2020 12:47 PM  [place] [id number] [phone] (office) [phone] (fax)

## 2020-11-22 NOTE — Assessment & Plan Note (Signed)
Improving on current regimen.  Informed patient to continue using antibiotics as prescribed until finished with therapy.  Follow-up if symptoms do not improve or worsen. Continue Augmentin as prescribed.

## 2020-11-22 NOTE — Patient Instructions (Signed)
Finish taking the antibiotic. Get chest xray done at your convenience. Follow up as scheduled

## 2020-11-22 NOTE — Progress Notes (Signed)
Acute Office Visit  Subjective:    Patient ID: [name] Cook, male    DOB: 1950/05/19, 70 y.o.   MRN: [id number]  Chief Complaint  Patient presents with   Abdominal Pain    Follow up- a little better-20% better, pain is still in the left side of the abdomen. Off and on. Pain has not radiated to another area, no new symptoms   Muscle Pain    Heard a pop in the left chest wall area on Saturday-11/20/20 while working on his car. Pain with breathing and to the touch now.     Patient is in today for a follow up regarding his abdomen pain. Pain is intermittently, aching, but improved. No fever, chills. Diarrhea completely gone. Still taking Augmentin as prescribed  Chest wall pain: States was working on his car [employer] down and felt a pop in his chest. No shortness of breath or chest pain. Has not tried anything over the counter with it. Sharp pain, does not radiate. Palpation and deep breathing illicit pain.   Past Medical History:  Diagnosis Date   Arthritis    "knees and lower back" (03/14/2013)   Atrial flutter ([place])    radiofrequency ablation in 2001   CAD (coronary artery disease)    a. Nonobstructive. Cardiac cath in 2001-50% mid RI, normal LM, LAD, RCA b. cath 10/16/2014 95% mid RCA treated with DES, 99% ost D1 medical management due to small aneurysmal segment   Chronic anticoagulation    chronic Coumadin anticoagulation   Chronic obstructive pulmonary disease ([place]) 04/20/2011   Diabetes mellitus, type 2 (HCC)    GERD (gastroesophageal reflux disease)    Hyperlipidemia    Hypertension    with hypertensive heart disease   Left knee pain 10/25/2017   medial   Obesity    Persistent atrial fibrillation ([place])    recurrent atrial flutter since 2001 s/p DCCVs, multiple failed AADs, h/o tachy-mediated cardiomyopathy   Shortness of breath    "can come on at any time" (03/14/2013)   Sleep apnea    "dx'd; couldn't wear the mask" (03/14/2013)   Tobacco abuse     Past Surgical  History:  Procedure Laterality Date   ATRIAL FLUTTER ABLATION  2002   atrial flutter; subsequently developed atrial fibrillation   AV NODE ABLATION  01/24/2013   CARDIAC CATHETERIZATION  2002   CARDIAC CATHETERIZATION N/A 10/16/2014   Procedure: Left Heart Cath and Coronary Angiography;  Surgeon: [name], MD;  Location: [place] [facility];  Service: Cardiovascular;  Laterality: N/A;   CARDIOVERSION  05/31/2011   Procedure: CARDIOVERSION;  Surgeon: Cristopher Estimable. Lattie Haw, MD;  Location: [facility];  Service: Cardiovascular;  Laterality: N/A;   CARPAL TUNNEL RELEASE Left 1980's   COLONOSCOPY WITH PROPOFOL N/A 12/30/2018   Procedure: COLONOSCOPY WITH PROPOFOL;  Surgeon: Jonathon Bellows, MD;  Location: [facility] ENDOSCOPY;  Service: Gastroenterology;  Laterality: N/A;   COLONOSCOPY WITH PROPOFOL N/A 12/04/2019   Procedure: COLONOSCOPY WITH PROPOFOL;  Surgeon: Jonathon Bellows, MD;  Location: [facility] ENDOSCOPY;  Service: Gastroenterology;  Laterality: N/A;   INSERT / REPLACE / REMOVE PACEMAKER  01/24/2013    Medtronic Adapta L dual-chamber pacemaker, serial number [id number]    LEFT HEART CATH AND CORONARY ANGIOGRAPHY N/A 06/07/2018   Procedure: LEFT HEART CATH AND CORONARY ANGIOGRAPHY;  Surgeon: [name], MD;  Location: [place] [facility];  Service: Cardiovascular;  Laterality: N/A;   LEFT HEART CATHETERIZATION WITH CORONARY ANGIOGRAM N/A 03/17/2013   Procedure: LEFT HEART CATHETERIZATION  WITH CORONARY ANGIOGRAM;  Surgeon: Burnell Blanks, MD; LAD mild dz, D1 branch 100%, inferior branch 99%, CFX OK, RCA 50%, EF 65%     LOOP RECORDER IMPLANT  2002   PERMANENT PACEMAKER INSERTION N/A 01/24/2013   Procedure: PERMANENT PACEMAKER INSERTION;  Surgeon: [name], MD;  Location: [facility] LAB;  Service: Cardiovascular;  Laterality: N/A;   TIBIAL TUBERCLERPLASTY  ~ 2003    Family History  Problem Relation Age of Onset   Alzheimer's disease Mother    Osteoporosis Mother     Social History    Socioeconomic History   Marital status: Widowed    Spouse name: Not on file   Number of children: 1   Years of education: Not on file   Highest education level: Not on file  Occupational History   Occupation: Unemployed    Employer: UNEMPLOYED  Tobacco Use   Smoking status: Former    Packs/day: 1.00    Years: 42.00    Pack years: 42.00    Types: Cigarettes    Quit date: 12/31/2011    Years since quitting: 8.9   Smokeless tobacco: Never  Vaping Use   Vaping Use: Never used  Substance and Sexual Activity   Alcohol use: Not Currently    Comment: 03/14/2013 "stopped drinking back in 2002; never had problem w/it"   Drug use: No   Sexual activity: Not Currently  Other Topics Concern   Not on file  Social History Narrative   Single.   Retired.    1 son, deceased.    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   Enjoys playing on the computer.       Social Determinants of Health   Financial Resource Strain: Low Risk    Difficulty of Paying Living Expenses: Not hard at all  Food Insecurity: No Food Insecurity   Worried About Charity fundraiser in the Last Year: Never true   [place] in the Last Year: Never true  Transportation Needs: No Transportation Needs   Lack of Transportation (Medical): No   Lack of Transportation (Non-Medical): No  Physical Activity: Inactive   Days of Exercise per Week: 0 days   Minutes of Exercise per Session: 0 min  Stress: No Stress Concern Present   Feeling of Stress : Not at all  Social Connections: Not on file  Intimate Partner Violence: Not At Risk   Fear of Current or Ex-Partner: No   Emotionally Abused: No   Physically Abused: No   Sexually Abused: No    Outpatient Medications Prior to Visit  Medication Sig Dispense Refill   albuterol (VENTOLIN HFA) 108 (90 Base) MCG/ACT inhaler INHALE 2 PUFFS BY MOUTH EVERY 4 HOURS AS NEEDED FOR WHEEZE OR FOR SHORTNESS OF BREATH 6.7 each 0    amoxicillin-clavulanate (AUGMENTIN) 875-125 MG tablet Take 1 tablet by mouth 2 (two) times daily. 20 tablet 0   citalopram (CELEXA) 40 MG tablet TAKE 1 TABLET (40 MG TOTAL) BY MOUTH DAILY. FOR DEPRESSION. 90 tablet 1   clopidogrel (PLAVIX) 75 MG tablet TAKE 1 TABLET BY MOUTH EVERY DAY 90 tablet 3   diclofenac Sodium (VOLTAREN) 1 % GEL Apply 2 g topically 4 (four) times daily. 50 g 0   empagliflozin (JARDIANCE) 10 MG TABS tablet TAKE 1 TABLET BY MOUTH EVERY MORNING FOR DIABETES 90 tablet 3   fish oil-omega-3 fatty acids 1000 MG capsule Take 1 g by mouth daily.  fluticasone (FLONASE) 50 MCG/ACT nasal spray PLACE 1 SPRAY INTO BOTH NOSTRILS DAILY AS NEEDED FOR ALLERGIES OR RHINITIS. 48 mL 3   furosemide (LASIX) 20 MG tablet TAKE 1 TABLET BY MOUTH EVERY DAY 90 tablet 3   gabapentin (NEURONTIN) 300 MG capsule Take 1 capsule (300 mg total) by mouth 2 (two) times daily. For neuropathy 180 capsule 3   glucose blood (ONE TOUCH ULTRA TEST) test strip USE TO TEST UP TO 4 TIMES DAILY 100 each 2   isosorbide mononitrate (IMDUR) 60 MG 24 hr tablet TAKE 1 TABLET BY MOUTH EVERY DAY 90 tablet 3   lisinopril (ZESTRIL) 2.5 MG tablet TAKE 1 TABLET BY MOUTH EVERY DAY 90 tablet 3   metFORMIN (GLUCOPHAGE) 500 MG tablet Take 2 tablets by mouth every morning with breakfast and 1 take tablet by mouth every evening with dinner for diabetes 270 tablet 1   metoprolol succinate (TOPROL-XL) 25 MG 24 hr tablet Take 0.5 tablets (12.5 mg total) by mouth daily. 45 tablet 3   nitroGLYCERIN (NITROSTAT) 0.4 MG SL tablet Place 1 tablet (0.4 mg total) under the tongue every 5 (five) minutes as needed for chest pain. 25 tablet 0   pantoprazole (PROTONIX) 40 MG tablet TAKE 1 TABLET (40 MG TOTAL) BY MOUTH DAILY FOR HEARTBURN. 90 tablet 3   rosuvastatin (CRESTOR) 20 MG tablet TAKE 1 TABLET BY MOUTH EVERY DAY IN THE EVENING FOR CHOLESTEROL 90 tablet 0   sitaGLIPtin (JANUVIA) 100 MG tablet TAKE 1 TABLET BY MOUTH ONCE DAILY FOR DIABETES 90  tablet 0   SYMBICORT 160-4.5 MCG/ACT inhaler INHALE 2 PUFFS INTO THE LUNGS TWICE A DAY 10.2 each 3   tamsulosin (FLOMAX) 0.4 MG CAPS capsule TAKE 1 CAPSULE (0.4 MG TOTAL) BY MOUTH 2 (TWO) TIMES DAILY. 180 capsule 3   warfarin (COUMADIN) 5 MG tablet TAKE 1/2 TABLET DAILY EXCEPT TAKE 1 TABLET ON MONDAYS, THURSDAYS & Saturdays OR AS DIRECTED BY COUMADIN CLINIC 90 tablet 0   No facility-administered medications prior to visit.    No Known Allergies  Review of Systems  Constitutional:  Negative for chills and fever.  Respiratory:  Negative for shortness of breath and wheezing.   Cardiovascular:  Positive for chest pain (MSK).  Gastrointestinal:  Positive for abdominal pain. Negative for diarrhea, nausea and vomiting.  Neurological:  Negative for numbness.  Hematological:  Bruises/bleeds easily.      Objective:    Physical Exam Vitals and nursing note reviewed.  Constitutional:      Appearance: He is obese.  Cardiovascular:     Rate and Rhythm: Normal rate and regular rhythm.     Heart sounds: Normal heart sounds.  Abdominal:     General: Abdomen is protuberant. Bowel sounds are normal.     Palpations: Abdomen is soft. There is no mass.     Tenderness: There is abdominal tenderness (could not tell he was tender on exam. Much improved from previous exam) in the left lower quadrant.  Musculoskeletal:       Arms:  Skin:    General: Skin is warm.     Comments: Scattered bruising to bilateral forearms  Neurological:     Mental Status: He is alert.  Psychiatric:        Mood and Affect: Mood normal.        Behavior: Behavior normal.    BP 92/60   Pulse 81   Temp (!) 97.5 F (36.4 C)   Resp 14   Ht '5\' 11"'$  (1.803 m)  Wt 228 lb (103.4 kg)   SpO2 94%   BMI 31.80 kg/m  Wt Readings from Last 3 Encounters:  11/22/20 228 lb (103.4 kg)  11/16/20 223 lb (101.2 kg)  10/26/20 231 lb (104.8 kg)    Health Maintenance Due  Topic Date Due   PNA vac Low Risk Adult (2 of 2 - PPSV23)  09/22/2018   FOOT EXAM  10/08/2020   INFLUENZA VACCINE  11/15/2020    There are no preventive care reminders to display for this patient.   Lab Results  Component Value Date   TSH 2.030 04/15/2019   Lab Results  Component Value Date   WBC 4.7 11/16/2020   HGB 14.6 11/16/2020   HCT 43.9 11/16/2020   MCV 97.9 11/16/2020   PLT 118.0 (L) 11/16/2020   Lab Results  Component Value Date   NA 139 11/16/2020   K 4.0 11/16/2020   CO2 25 11/16/2020   GLUCOSE 212 (H) 11/16/2020   BUN 19 11/16/2020   CREATININE 1.11 11/16/2020   BILITOT 0.6 11/16/2020   ALKPHOS 44 11/16/2020   AST 21 11/16/2020   ALT 20 11/16/2020   PROT 6.6 11/16/2020   ALBUMIN 4.2 11/16/2020   CALCIUM 9.8 11/16/2020   ANIONGAP 11 01/23/2020   GFR 68.24 11/16/2020   Lab Results  Component Value Date   CHOL 131 10/07/2020   Lab Results  Component Value Date   HDL 43.90 10/07/2020   Lab Results  Component Value Date   LDLCALC 55 10/07/2020   Lab Results  Component Value Date   TRIG 161.0 (H) 10/07/2020   Lab Results  Component Value Date   CHOLHDL 3 10/07/2020   Lab Results  Component Value Date   HGBA1C 7.6 (H) 10/07/2020       Assessment & Plan:   Problem List Items Addressed This Visit       Digestive   Acute diverticulitis    Improving on current regimen.  Informed patient to continue using antibiotics as prescribed until finished with therapy.  Follow-up if symptoms do not improve or worsen. Continue Augmentin as prescribed.         Other   Long term current use of anticoagulant therapy    Patient currently on Coumadin.  Since recent antibiotic treatment we will check INR today pending results and review we will determine next INR check.  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  Has been stable since incident on 8-09/2020.  Will elect  for chest x-ray to make sure no bony abnormality noted and lungs unaffected.  X-ray ordered pending result.       Relevant Orders   DG Chest 2 View     No orders of the defined types were placed in this encounter.  This visit occurred during the SARS-CoV-2 public health emergency.  Safety protocols were in place, including screening questions prior to the visit, additional usage of staff PPE, and extensive cleaning of exam room while observing appropriate contact time as indicated for disinfecting solutions.   Romilda Garret, NP

## 2020-11-23 ENCOUNTER — Ambulatory Visit (INDEPENDENT_AMBULATORY_CARE_PROVIDER_SITE_OTHER): Payer: Medicare Other | Admitting: Student

## 2020-11-23 ENCOUNTER — Encounter: Payer: Medicare Other | Admitting: Physician Assistant

## 2020-11-23 ENCOUNTER — Encounter: Payer: Self-pay | Admitting: Student

## 2020-11-23 VITALS — BP 112/62 | HR 88 | Ht 71.0 in | Wt 228.8 lb

## 2020-11-23 DIAGNOSIS — Z950 Presence of cardiac pacemaker: Secondary | ICD-10-CM

## 2020-11-23 DIAGNOSIS — I4821 Permanent atrial fibrillation: Secondary | ICD-10-CM

## 2020-11-23 DIAGNOSIS — I442 Atrioventricular block, complete: Secondary | ICD-10-CM | POA: Diagnosis not present

## 2020-11-23 DIAGNOSIS — I952 Hypotension due to drugs: Secondary | ICD-10-CM

## 2020-11-23 DIAGNOSIS — I5022 Chronic systolic (congestive) heart failure: Secondary | ICD-10-CM

## 2020-11-23 LAB — CUP PACEART INCLINIC DEVICE CHECK
Battery Impedance: 797 Ohm
Battery Remaining Longevity: 75 mo
Battery Voltage: 2.78 V
Brady Statistic RV Percent Paced: 100 %
Date Time Interrogation Session: 20220809124504
Implantable Lead Implant Date: 20141010
Implantable Lead Implant Date: 20141010
Implantable Lead Location: 753859
Implantable Lead Location: 753860
Implantable Lead Model: 5076
Implantable Lead Model: 5076
Implantable Pulse Generator Implant Date: 20141010
Lead Channel Impedance Value: 636 Ohm
Lead Channel Impedance Value: 67 Ohm
Lead Channel Pacing Threshold Amplitude: 0.75 V
Lead Channel Pacing Threshold Amplitude: 0.875 V
Lead Channel Pacing Threshold Amplitude: 5 V
Lead Channel Pacing Threshold Pulse Width: 0.27 ms
Lead Channel Pacing Threshold Pulse Width: 0.4 ms
Lead Channel Pacing Threshold Pulse Width: 0.4 ms
Lead Channel Setting Pacing Amplitude: 2.5 V
Lead Channel Setting Pacing Pulse Width: 0.4 ms
Lead Channel Setting Sensing Sensitivity: 4 mV

## 2020-11-23 NOTE — Patient Instructions (Signed)
Medication Instructions:  Your physician recommends that you continue on your current medications as directed. Please refer to the Current Medication list given to you today.  *If you need a refill on your cardiac medications before your next appointment, please call your pharmacy*   Lab Work: None If you have labs (blood work) drawn today and your tests are completely normal, you will receive your results only by: [place] (if you have MyChart) OR A paper copy in the mail If you have any lab test that is abnormal or we need to change your treatment, we will call you to review the results.   Testing/Procedures: Your physician has requested that you have an echocardiogram. Echocardiography is a painless test that uses sound waves to create images of your heart. It provides your doctor with information about the size and shape of your heart and how well your heart's chambers and valves are working. This procedure takes approximately one hour. There are no restrictions for this procedure.   Follow-Up: At [facility], you and your health needs are our priority.  As part of our continuing mission to provide you with exceptional heart care, we have created designated Provider Care Teams.  These Care Teams include your primary Cardiologist (physician) and Advanced Practice Providers (APPs -  Physician Assistants and Nurse Practitioners) who all work together to provide you with the care you need, when you need it.  We recommend signing up for the patient portal called "MyChart".  Sign up information is provided on this After Visit Summary.  MyChart is used to connect with patients for Virtual Visits (Telemedicine).  Patients are able to view lab/test results, encounter notes, upcoming appointments, etc.  Non-urgent messages can be sent to your provider as well.   To learn more about what you can do with MyChart, go to [email or web address].    Your next appointment:   6 month(s)  The  format for your next appointment:   In Person  Provider:   You may see [name], MD or one of the following Advanced Practice Providers on your designated Care Team:   Tommye Standard, [place] [facility] [place], [place]

## 2020-11-23 NOTE — Progress Notes (Signed)
Agree. Thanks

## 2020-11-25 ENCOUNTER — Other Ambulatory Visit: Payer: Self-pay

## 2020-11-25 ENCOUNTER — Ambulatory Visit (HOSPITAL_COMMUNITY): Payer: Medicare Other | Attending: Cardiology

## 2020-11-25 DIAGNOSIS — I5022 Chronic systolic (congestive) heart failure: Secondary | ICD-10-CM | POA: Diagnosis not present

## 2020-11-25 LAB — ECHOCARDIOGRAM COMPLETE: S' Lateral: 3.4 cm

## 2020-11-25 MED ORDER — PERFLUTREN LIPID MICROSPHERE
1.0000 mL | INTRAVENOUS | Status: AC | PRN
  Administered 2020-11-25: 1 mL via INTRAVENOUS

## 2020-11-26 ENCOUNTER — Telehealth: Payer: Self-pay | Admitting: Student

## 2020-11-26 NOTE — Telephone Encounter (Signed)
Patient returning call for echo results. 

## 2020-11-29 ENCOUNTER — Ambulatory Visit: Payer: Medicare Other | Admitting: Nurse Practitioner

## 2020-12-01 ENCOUNTER — Telehealth: Payer: Self-pay | Admitting: Primary Care

## 2020-12-01 NOTE — Chronic Care Management (AMB) (Signed)
  Chronic Care Management   Note  12/01/2020 Name: [name] Cook MRN: [id number] DOB: 1951/01/24  [name] Cook is a 70 y.o. year old male who is a primary care patient of [name], NP. I reached out to [name] by phone today in response to a referral sent by [name]'s PCP, [name], NP.   Mr. Frischman was given information about Chronic Care Management services today including:  CCM service includes personalized support from designated clinical staff supervised by his physician, including individualized plan of care and coordination with other care providers 24/7 contact phone numbers for assistance for urgent and routine care needs. Service will only be billed when office clinical staff spend 20 minutes or more in a month to coordinate care. Only one practitioner may furnish and bill the service in a calendar month. The patient may stop CCM services at any time (effective at the end of the month) by phone call to the office staff.   Patient agreed to services and verbal consent obtained.   Follow up plan:   Tatjana Secretary/administrator

## 2020-12-02 ENCOUNTER — Ambulatory Visit: Payer: Medicare Other

## 2020-12-06 ENCOUNTER — Telehealth: Payer: Self-pay

## 2020-12-06 NOTE — Chronic Care Management (AMB) (Addendum)
Chronic Care Management Pharmacy Assistant   Name: [name] Cook  MRN: [id number] DOB: 22-Jul-1950  [name] Cook is an 70 y.o. year old male who presents for his initial CCM visit with the clinical pharmacist.  Reason for Encounter: Initial Questions   Conditions to be addressed/monitored: CAD, HTN, HLD, COPD, DMII, and CKD Stage 2   Recent office visits:  11/22/20 - Family Medicine - Patient presented for follow up abdomen pain. Advised to finish taking the antibiotic as prescribed earlier. 11/16/20 - Family Medicine - Patient presented for abdomen pain. Start Augmentin twice daily for next 10 days. 10/26/20 - Alma Friendly, NP, PCP - Pt presented for video visit for abdomen pain. Start Augmentin course BID x 10 days. Follow clear liquid diet. Advised to go to ED if symptoms worsen. 10/07/20 - PCP - Patient presented for AWV. Labs ordered. Call GI doctor for colonoscopy. Follow up 6 months. 06/29/20 - PCP, video visit - Patient presented with a cough, nasal congestion. Discussed to use albuterol inhaler PRN SOB. Start prednisone course. Rx for Cheratussin sent to pharmacy. 06/23/20 - Family Medicine - Patient presented for bilateral knee pain. Bilateral knee injections given (Durolane '60mg'$ /65m).  Recent consult visits:  11/23/20 - Cardiology - Patient presented for routine electrophysiology follow up. No medication changes. 06/15/20 - Urology - Patient presented for follow up prostate cancer. Currently on Flomax twice daily, may titrate off if his symptoms are well controlled.  Hospital visits:  None in previous 6 months  Medications: Outpatient Encounter Medications as of 12/06/2020  Medication Sig   albuterol (VENTOLIN HFA) 108 (90 Base) MCG/ACT inhaler INHALE 2 PUFFS BY MOUTH EVERY 4 HOURS AS NEEDED FOR WHEEZE OR FOR SHORTNESS OF BREATH   amoxicillin-clavulanate (AUGMENTIN) 875-125 MG tablet Take 1 tablet by mouth 2 (two) times daily.   citalopram (CELEXA) 40 MG tablet TAKE 1 TABLET (40 MG  TOTAL) BY MOUTH DAILY. FOR DEPRESSION.   clopidogrel (PLAVIX) 75 MG tablet TAKE 1 TABLET BY MOUTH EVERY DAY   diclofenac Sodium (VOLTAREN) 1 % GEL Apply 2 g topically 4 (four) times daily.   empagliflozin (JARDIANCE) 10 MG TABS tablet TAKE 1 TABLET BY MOUTH EVERY MORNING FOR DIABETES   fish oil-omega-3 fatty acids 1000 MG capsule Take 1 g by mouth daily.     fluticasone (FLONASE) 50 MCG/ACT nasal spray PLACE 1 SPRAY INTO BOTH NOSTRILS DAILY AS NEEDED FOR ALLERGIES OR RHINITIS.   furosemide (LASIX) 20 MG tablet TAKE 1 TABLET BY MOUTH EVERY DAY   gabapentin (NEURONTIN) 300 MG capsule Take 1 capsule (300 mg total) by mouth 2 (two) times daily. For neuropathy   glucose blood (ONE TOUCH ULTRA TEST) test strip USE TO TEST UP TO 4 TIMES DAILY   isosorbide mononitrate (IMDUR) 60 MG 24 hr tablet TAKE 1 TABLET BY MOUTH EVERY DAY   lisinopril (ZESTRIL) 2.5 MG tablet TAKE 1 TABLET BY MOUTH EVERY DAY   metFORMIN (GLUCOPHAGE) 500 MG tablet Take 2 tablets by mouth every morning with breakfast and 1 take tablet by mouth every evening with dinner for diabetes   metoprolol succinate (TOPROL-XL) 25 MG 24 hr tablet Take 0.5 tablets (12.5 mg total) by mouth daily.   nitroGLYCERIN (NITROSTAT) 0.4 MG SL tablet Place 1 tablet (0.4 mg total) under the tongue every 5 (five) minutes as needed for chest pain.   pantoprazole (PROTONIX) 40 MG tablet TAKE 1 TABLET (40 MG TOTAL) BY MOUTH DAILY FOR HEARTBURN.   rosuvastatin (CRESTOR) 20 MG tablet TAKE 1  TABLET BY MOUTH EVERY DAY IN THE EVENING FOR CHOLESTEROL   sitaGLIPtin (JANUVIA) 100 MG tablet TAKE 1 TABLET BY MOUTH ONCE DAILY FOR DIABETES   SYMBICORT 160-4.5 MCG/ACT inhaler INHALE 2 PUFFS INTO THE LUNGS TWICE A DAY   tamsulosin (FLOMAX) 0.4 MG CAPS capsule TAKE 1 CAPSULE (0.4 MG TOTAL) BY MOUTH 2 (TWO) TIMES DAILY.   warfarin (COUMADIN) 5 MG tablet TAKE 1/2 TABLET DAILY EXCEPT TAKE 1 TABLET ON MONDAYS, THURSDAYS & Saturdays OR AS DIRECTED BY COUMADIN CLINIC   No  facility-administered encounter medications on file as of 12/06/2020.     Lab Results  Component Value Date/Time   HGBA1C 7.6 (H) 10/07/2020 10:18 AM   HGBA1C 6.5 (A) 01/07/2020 08:55 AM   HGBA1C 7.1 (H) 10/07/2019 10:41 AM   HGBA1C 6.4 (H) 11/27/2016 11:33 AM   HGBA1C 7.1 (H) 08/21/2016 10:25 AM   MICROALBUR <0.7 09/21/2017 01:06 PM   MICROALBUR <0.2 11/27/2016 11:33 AM     BP Readings from Last 3 Encounters:  11/23/20 112/62  11/22/20 92/60  11/16/20 92/60    Patient contacted to review initial questions prior to visit with Debbora Dus.  Have you seen any other providers since your last visit with PCP? Yes - cardiology, urology  Any changes in your medications or health? Yes currently the patient reports he is having diarrhea ongoing for over 1 month  Any side effects from any medications? No  Do you have an symptoms or problems not managed by your medications? No  Any concerns about your health right now? Yes - diarrhea  Has your provider asked that you check blood pressure, blood sugar, or follow special diet at home?  The patient will check his BG at home. He is not on any special diet at this time.  Do you get any type of exercise on a regular basis? No  Can you think of a goal you would like to reach for your health? Yes - feel better  Do you have any problems getting your medications? No  Is there anything that you would like to discuss during the appointment? No   [name] Cook was reminded to have all medications, supplements and any blood glucose and blood pressure readings available for review with Debbora Dus, Pharm. D, at his office visit on 12/07/20 at 2:00pm.    Star Rating Drugs:  Medication:  Last Fill: Day Supply Jardiance '10mg'$  11/09/20 90 Metformin '500mg'$  11/22/20  90 Januvia '100mg'$  11/29/20 90 Rosuvastatin '20mg'$  11/28/20 90 Lisinopril 2.'5mg'$  10/16/20  90   Care Gaps: Last annual wellness visit: 10/07/20 If Diabetic: Last eye exam / retinopathy  screening: 10/07/19 Last diabetic foot exam: 10/07/20  Debbora Dus, CPP notified  Avel Sensor, Lane Assistant [phone]  I have reviewed the care management and care coordination activities outlined in this encounter and I am certifying that I agree with the content of this note. No further action required.  Debbora Dus, PharmD Clinical Pharmacist [facility] [phone]

## 2020-12-07 ENCOUNTER — Other Ambulatory Visit: Payer: Self-pay

## 2020-12-07 ENCOUNTER — Ambulatory Visit (INDEPENDENT_AMBULATORY_CARE_PROVIDER_SITE_OTHER): Payer: Medicare Other

## 2020-12-07 DIAGNOSIS — F339 Major depressive disorder, recurrent, unspecified: Secondary | ICD-10-CM | POA: Diagnosis not present

## 2020-12-07 DIAGNOSIS — E119 Type 2 diabetes mellitus without complications: Secondary | ICD-10-CM | POA: Diagnosis not present

## 2020-12-07 DIAGNOSIS — E785 Hyperlipidemia, unspecified: Secondary | ICD-10-CM

## 2020-12-07 DIAGNOSIS — I10 Essential (primary) hypertension: Secondary | ICD-10-CM

## 2020-12-07 NOTE — Progress Notes (Signed)
Chronic Care Management Pharmacy Note  12/07/20 Name:  [name] Cook MRN:  [id number] DOB:  December 03, 1950  Summary: Initial CCM visit and medication review in office. Patient brought all medications with him. Verified med list accuracy. [name], long-term partner assists with filling his pillbox for him. [facility]. There were a few discrepancies - he did not have Lasix with him today (needed refill) and metformin bottle still has old directions of 1000 mg/day. He was not sure which dose he is taking. His chief complaint is diarrhea, depressed mood and ED. 60+ minutes spent reviewing medications, counseling on indication and appropriate admin.   Recommendations/Changes made from today's visit: Stop taking OTC stool softener due to diarrhea  Plan: Follow up 2 weeks for PHQ-9, GAD-7/depression assessment, COPD CAT score.   Subjective: [name] Cook is an 70 y.o. year old male who is a primary patient of [name], NP.  The CCM team was consulted for assistance with disease management and care coordination needs.    Engaged with patient face to face for initial visit in response to provider referral for pharmacy case management and/or care coordination services.   Consent to Services:  The patient was given the following information about Chronic Care Management services today, agreed to services, and gave verbal consent: 1. CCM service includes personalized support from designated clinical staff supervised by the primary care provider, including individualized plan of care and coordination with other care providers 2. 24/7 contact phone numbers for assistance for urgent and routine care needs. 3. Service will only be billed when office clinical staff spend 20 minutes or more in a month to coordinate care. 4. Only one practitioner may furnish and bill the service in a calendar month. 5.The patient may stop CCM services at any time (effective at the end of the month) by  phone call to the office staff. 6. The patient will be responsible for cost sharing (co-pay) of up to 20% of the service fee (after annual deductible is met). Patient agreed to services and consent obtained.  Patient Care Team: [name], NP as PCP - General (Internal Medicine) [name], MD as PCP - Cardiology (Cardiology) [name], MD as PCP - Electrophysiology (Cardiology) Rothbart, Cristopher Estimable, MD (Inactive) (Cardiology) Noreene Filbert, MD as Radiation Oncologist (Radiation Oncology) Debbora Dus, [facility] as Pharmacist (Pharmacist)  Recent office visits: 11/22/20 - Family Medicine - Patient presented for follow up abdomen pain. Improving on current regimen. Advised to finish taking the antibiotic as prescribed earlier. Follow up if symptoms do not improve. 11/16/20 - Family Medicine - Patient presented for abdomen pain. Start Augmentin twice daily for next 10 days. 10/26/20 - Alma Friendly, NP, PCP - Pt presented for video visit for abdomen pain. Start Augmentin course BID x 10 days. Follow clear liquid diet. Advised to go to ED if symptoms worsen. 10/07/20 - PCP - Patient presented for AWV. Labs ordered. Call GI doctor for colonoscopy. A1c elevated, Increase metformin, take 2 tablets of metformin in the morning and continue with 1 tablet at night. A1c 3 months. Follow up 6 months. 06/29/20 - PCP, video visit - Patient presented with a cough, nasal congestion. Discussed to use albuterol inhaler PRN SOB. Start prednisone course. Rx for Cheratussin sent to pharmacy. 06/23/20 - Family Medicine - Patient presented for bilateral knee pain. Bilateral knee injections given (Durolane 70m/3ml).   Recent consult visits:  11/23/20 - Cardiology - Patient presented for routine electrophysiology follow  up. No medication changes. 06/15/20 - Urology - Patient presented for follow up prostate cancer. Currently on Flomax twice daily, may titrate off if his symptoms are well controlled.  Hospital  visits: None in previous 6 months   Objective:  Lab Results  Component Value Date   CREATININE 1.11 11/16/2020   BUN 19 11/16/2020   GFR 68.24 11/16/2020   GFRNONAA >60 01/23/2020   GFRAA 72 06/23/2019   NA 139 11/16/2020   K 4.0 11/16/2020   CALCIUM 9.8 11/16/2020   CO2 25 11/16/2020   GLUCOSE 212 (H) 11/16/2020    Lab Results  Component Value Date/Time   HGBA1C 7.6 (H) 10/07/2020 10:18 AM   HGBA1C 6.5 (A) 01/07/2020 08:55 AM   HGBA1C 7.1 (H) 10/07/2019 10:41 AM   HGBA1C 6.4 (H) 11/27/2016 11:33 AM   HGBA1C 7.1 (H) 08/21/2016 10:25 AM   GFR 68.24 11/16/2020 12:30 PM   GFR 64.77 10/07/2020 10:18 AM   MICROALBUR <0.7 09/21/2017 01:06 PM   MICROALBUR <0.2 11/27/2016 11:33 AM    Last diabetic Eye exam:  Lab Results  Component Value Date/Time   HMDIABEYEEXA No Retinopathy 08/28/2020 12:00 AM    Last diabetic Foot exam: 09/2019 PCP  Lab Results  Component Value Date   CHOL 131 10/07/2020   HDL 43.90 10/07/2020   LDLCALC 55 10/07/2020   TRIG 161.0 (H) 10/07/2020   CHOLHDL 3 10/07/2020    Hepatic Function Latest Ref Rng & Units 11/16/2020 10/07/2020 01/23/2020  Total Protein 6.0 - 8.3 g/dL 6.6 6.5 7.2  Albumin 3.5 - 5.2 g/dL 4.2 4.3 4.3  AST 0 - 37 U/L _0 ALT 0 - 53 U/L _1 Alk Phosphatase 39 - 117 U/L 44 51 55  Total Bilirubin 0.2 - 1.2 mg/dL 0.6 0.6 0.9  Bilirubin, Direct 0.00 - 0.40 mg/dL - - -    Lab Results  Component Value Date/Time   TSH 2.030 04/15/2019 10:05 AM   TSH 1.93 10/27/2015 10:54 AM   FREET4 1.38 03/14/2013 11:12 AM   FREET4 1.11 05/10/2010 10:02 AM    CBC Latest Ref Rng & Units 11/16/2020 10/07/2020 01/23/2020  WBC 4.0 - 10.5 K/uL 4.7 4.9 7.3  Hemoglobin 13.0 - 17.0 g/dL 14.6 15.3 15.6  Hematocrit 39.0 - 52.0 % 43.9 44.8 45.2  Platelets 150.0 - 400.0 K/uL 118.0(L) 109.0 Repeated and verified X2.(L) 128(L)    Lab Results  Component Value Date/Time   VD25OH 32 10/27/2015 10:54 AM    Clinical ASCVD: Yes  The ASCVD Risk score  Mikey Bussing DC Jr., et al., 2013) failed to calculate for the following reasons:   The patient has a prior MI or stroke diagnosis    Depression screen [facility] 2/9 10/07/2020 10/06/2020 06/29/2020  Decreased Interest 0 0 0  Down, Depressed, Hopeless 0 0 0  PHQ - 2 Score 0 0 0  Altered sleeping 0 0 0  Tired, decreased energy 0 0 0  Change in appetite 0 0 0  Feeling bad or failure about yourself  0 0 0  Trouble concentrating 0 0 0  Moving slowly or fidgety/restless 0 0 0  Suicidal thoughts 0 0 0  PHQ-9 Score 0 0 0  Difficult doing work/chores Not difficult at all Not difficult at all Not difficult at all  Some recent data might be hidden     Social History   Tobacco Use  Smoking Status Former   Packs/day: 1.00   Years: 42.00   Pack years: 42.00  Types: Cigarettes   Quit date: 12/31/2011   Years since quitting: 8.9  Smokeless Tobacco Never   BP Readings from Last 3 Encounters:  11/23/20 112/62  11/22/20 92/60  11/16/20 92/60   Pulse Readings from Last 3 Encounters:  11/23/20 88  11/22/20 81  11/16/20 72   Wt Readings from Last 3 Encounters:  11/23/20 228 lb 12.8 oz (103.8 kg)  11/22/20 228 lb (103.4 kg)  11/16/20 223 lb (101.2 kg)   BMI Readings from Last 3 Encounters:  11/23/20 31.91 kg/m  11/22/20 31.80 kg/m  11/16/20 31.10 kg/m    Assessment/Interventions: Review of patient past medical history, allergies, medications, health status, including review of consultants reports, laboratory and other test data, was performed as part of comprehensive evaluation and provision of chronic care management services.   SDOH:  (Social Determinants of Health) assessments and interventions performed: Yes SDOH Interventions    Flowsheet Row Most Recent Value  SDOH Interventions   Financial Strain Interventions Intervention Not Indicated      SDOH Screenings   Alcohol Screen: Low Risk    Last Alcohol Screening Score (AUDIT): 0  Depression (PHQ2-9): Low Risk    PHQ-2 Score: 0   Financial Resource Strain: Low Risk    Difficulty of Paying Living Expenses: Not very hard  Food Insecurity: No Food Insecurity   Worried About Charity fundraiser in the Last Year: Never true   Ran Out of Food in the Last Year: Never true  Housing: Low Risk    Last Housing Risk Score: 0  Physical Activity: Inactive   Days of Exercise per Week: 0 days   Minutes of Exercise per Session: 0 min  Social Connections: Not on file  Stress: No Stress Concern Present   Feeling of Stress : Not at all  Tobacco Use: Medium Risk   Smoking Tobacco Use: Former   Smokeless Tobacco Use: Never  Transportation Needs: No Data processing manager (Medical): No   Lack of Transportation (Non-Medical): No    CCM Care Plan  No Known Allergies  Medications Reviewed Today     Reviewed by Debbora Dus, [facility] (Pharmacist) on 12/07/20 at [place] Status: <None>   Medication Order Taking? Sig Documenting Provider Last Dose Status Informant  albuterol (VENTOLIN HFA) 108 (90 Base) MCG/ACT inhaler [id number] Yes INHALE 2 PUFFS BY MOUTH EVERY 4 HOURS AS NEEDED FOR WHEEZE OR FOR SHORTNESS OF Nicholes Stairs, NP Taking Active   Patient not taking:  Discontinued 12/07/20 1459 (Completed Course)   citalopram (CELEXA) 40 MG tablet [id number] Yes TAKE 1 TABLET (40 MG TOTAL) BY MOUTH DAILY. FOR DEPRESSION. [name], NP Taking Active   clopidogrel (PLAVIX) 75 MG tablet [id number] Yes TAKE 1 TABLET BY MOUTH EVERY DAY [name], MD Taking Active   diclofenac Sodium (VOLTAREN) 1 % GEL [id number] No Apply 2 g topically 4 (four) times daily.  Patient not taking: Reported on 12/07/2020   Delia Heady, [place] Not Taking Active   empagliflozin (JARDIANCE) 10 MG TABS tablet [id number] Yes TAKE 1 TABLET BY MOUTH EVERY MORNING FOR DIABETES [name], NP Taking Active   fish oil-omega-3 fatty acids 1000 MG capsule [id number] Yes Take 1 g by mouth daily.   [provider] Taking Active Self  fluticasone (FLONASE) 50 MCG/ACT nasal spray [id number] Yes PLACE 1 SPRAY INTO BOTH NOSTRILS DAILY AS NEEDED FOR ALLERGIES OR RHINITIS. Candler, Modena Nunnery, MD Taking Active   furosemide (LASIX)  20 MG tablet [id number] Yes TAKE 1 TABLET BY MOUTH EVERY DAY [name], MD Taking Active   gabapentin (NEURONTIN) 300 MG capsule [id number] Yes Take 1 capsule (300 mg total) by mouth 2 (two) times daily. For neuropathy [name], NP Taking Active   glucose blood (ONE TOUCH ULTRA TEST) test strip [id number] Yes USE TO TEST UP TO 4 TIMES DAILY [name], NP Taking Active   isosorbide mononitrate (IMDUR) 60 MG 24 hr tablet [id number] Yes TAKE 1 TABLET BY MOUTH EVERY DAY [name], MD Taking Active   lisinopril (ZESTRIL) 2.5 MG tablet [id number] Yes TAKE 1 TABLET BY MOUTH EVERY DAY [name], MD Taking Active   metFORMIN (GLUCOPHAGE) 500 MG tablet [id number] Yes Take 2 tablets by mouth every morning with breakfast and 1 take tablet by mouth every evening with dinner for diabetes [name], NP Taking Active   metoprolol succinate (TOPROL-XL) 25 MG 24 hr tablet [id number] Yes Take 0.5 tablets (12.5 mg total) by mouth daily. Baldwin [place], PA-C Taking Active   nitroGLYCERIN (NITROSTAT) 0.4 MG SL tablet [id number] Yes Place 1 tablet (0.4 mg total) under the tongue every 5 (five) minutes as needed for chest pain. [name], NP Taking Active   pantoprazole (PROTONIX) 40 MG tablet [id number] Yes TAKE 1 TABLET (40 MG TOTAL) BY MOUTH DAILY FOR HEARTBURN. [name], NP Taking Active   rosuvastatin (CRESTOR) 20 MG tablet [id number] Yes TAKE 1 TABLET BY MOUTH EVERY DAY IN THE EVENING FOR CHOLESTEROL [name], NP Taking Active   sitaGLIPtin (JANUVIA) 100 MG tablet [id number] Yes TAKE 1 TABLET BY MOUTH ONCE DAILY FOR DIABETES [name], NP Taking Active   SYMBICORT 160-4.5 MCG/ACT inhaler [id number] Yes INHALE 2 PUFFS INTO THE LUNGS  TWICE A DAY [name], NP Taking Active   tamsulosin (FLOMAX) 0.4 MG CAPS capsule [id number] Yes TAKE 1 CAPSULE (0.4 MG TOTAL) BY MOUTH 2 (TWO) TIMES DAILY. Noreene Filbert, MD Taking Active   warfarin (COUMADIN) 5 MG tablet [id number] Yes TAKE 1/2 TABLET DAILY EXCEPT TAKE 1 TABLET ON MONDAYS, THURSDAYS & Saturdays OR AS DIRECTED BY COUMADIN CLINIC [name], NP Taking Active             Patient Active Problem List   Diagnosis Date Noted   Chest wall pain 11/22/2020   Left lower quadrant abdominal pain 10/26/2020   Acute diverticulitis 02/06/2020   Preventative health care 10/09/2019   Ear canal dryness, bilateral 08/12/2019   Polyp of colon    Decreased renal function 06/27/2018   Prostate cancer ([place]) 06/10/2018   Chest pain 06/04/2018   OSA (obstructive sleep apnea) 10/01/2017   Encounter for screening colonoscopy 10/01/2017   Osteoarthritis 06/26/2017   Insomnia 10/01/2014   Depression 03/09/2014   Coronary artery disease 11/06/2013   Long term current use of anticoagulant therapy 06/19/2013   Pacemaker 04/30/2013   Acute myocardial infarction, subendocardial infarction, initial episode of care ([place]) 03/15/2013   Abnormal LFTs 05/02/2012   Chronic kidney disease, 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/26/2011   Chronic obstructive pulmonary disease ([place]) 04/20/2011   Atrial fibrillation    Hypertension    Hyperlipidemia    Diabetes mellitus type 2, uncomplicated ([place])    Atrial flutter ([place])    GASTROESOPHAGEAL REFLUX DISEASE 05/16/2010    Immunization History  Administered Date(s) Administered   Influenza, High Dose Seasonal PF 02/20/2018, 02/03/2019   Influenza,inj,Quad PF,6+ Mos 02/05/2014, 01/04/2015, 04/20/2016   Influenza-Unspecified 01/05/2011,  01/15/2013, 01/30/2020   PFIZER(Purple Top)SARS-COV-2 Vaccination 09/17/2019, 10/08/2019   Pneumococcal Conjugate-13 09/21/2017   Pneumococcal Polysaccharide-23 01/05/2011   Tdap 01/05/2011    Conditions to be  addressed/monitored:  Hypertension, Hyperlipidemia, Diabetes, Atrial Fibrillation, and Chronic Kidney Disease  Care Plan : [place]  Updates made by Debbora Dus, [facility] since 12/14/2020 12:00 AM     Problem: Disease Management   Priority: High  Onset Date: 12/07/2020  Note:   Current Barriers:  Chief complaint - ongoing diarrhea, depressed mood, ED   Pharmacist Clinical Goal(s):  Patient will contact provider office for questions/concerns as evidenced notation of same in electronic health record through collaboration with PharmD and provider.   Interventions: 1:1 collaboration with [name], NP regarding development and update of comprehensive plan of care as evidenced by provider attestation and co-signature Inter-disciplinary care team collaboration (see longitudinal plan of care) Comprehensive medication review performed; medication list updated in electronic medical record  Hypertension (BP goal <130/80) -Controlled - per clinic readings -Current treatment: Lisinopril 2.5 mg - 1 tablet daily Metoprolol succinate 25 mg - 1 /2 tablet daily Furosemide 20 mg - 1 daily  -He did not have Lasix with him today. Reminded him to check for bottle when he got home. States he needs a refill. Checks weigh 3 days/week and weight steady at 223 lbs.  -Medications previously tried: none reported  -Current home readings: reports BP was 130/85, 91 (yesterday - before meds), he checks weight, BP and oxygen 3 days a week on tablet sent from [facility] -Current dietary habits: not discussed  -Current exercise habits: "busy bee", stays active -Denies hypotensive/hypertensive symptoms -Educated on BP goals and benefits of medications for prevention of heart attack, stroke and kidney damage; -Counseled to monitor BP at home as instructed 2 days per week, document, and provide log at future appointments -Recommended to continue current medication; Verify you are taking furosemide daily.    Hyperlipidemia/CAD: (LDL goal < 70) -Controlled - LDL 55 -CAD, stent Feb 2020, followed by cardiology -Pt denies any chest pain or abnormal symptoms. Affirms daily adherence.  -Current treatment: Rosuvastatin 20 mg - 1 tablet daily Fish Oil 1000 mg - daily Plavix 75 mg - 1 tablet daily Isosorbide Mononitrate 60 mg - 1 tablet daily Metoprolol succinate 25 mg -1/2 daily Nitroglycerin - PRN  -Medications previously tried: none reported  -Educated on Cholesterol goals;  -Recommended to continue current medication  Diabetes (A1c goal <7%) -Not ideally controlled - A1c 7.6% 06/22 - metformin increased after last A1c elevation -Current medications: Januvia 100 mg - 1 tablet daly Jardiance 10 mg - 1 tablet daily Metformin 500 mg - 2 tablet in morning and 1 evening  (dose increased 10/09/20) -Medications previously tried: none reported  -Current home glucose readings - checks fasting 3 days per week fasting glucose: 157 yesterday, usually 130s post prandial glucose: none  -Denies hypoglycemic/hyperglycemic symptoms -Educated on A1c and blood sugar goals; -No cost concerns  -Pt has had urgent diarrhea on/off the past few months. It improved while on antibiotics but has worsened again. It is unclear whether this is caused by metformin dose increase. He is not entirely clear on his current metformin dose (bottle he brought today has old directions for 1000 mg/day instead of 1500 mg). Need to verify with [name]. -Counseled to check feet daily and get yearly eye exams - due for foot exam. Pt reports he had feet checked recently and no abnormalities. He does have neuropathy. Taking gabapentin BID. It  helps but does not completely resolve symptoms.  -Recommended to verify home dose of metformin. Stop taking OTC stool softener. Follow up 2 weeks to see if diarrhea has resolved.  Atrial Fibrillation (Goal: prevent stroke and major bleeding) -Controlled - per patient report no symptoms, INR  stable -Followed by cardiology, INR per [facility] 11/22/20 within range -CHADSVASC: 5 -Current treatment: Rate control: Metoprolol succinate 25 mg -1 /2 tablet daily Anticoagulation: Wafarin 5 mg - as directed -Medications previously tried: none reported - no DOAC history due to cost concern -Home BP and HR readings: see HTN section -Counseled on importance of adherence to anticoagulant exactly as prescribed; avoidance of NSAIDs due to increased bleeding risk with anticoagulants; -Recommended to continue current medication  COPD/OSA (Goal: control symptoms and prevent exacerbations) -Not ideally controlled - per patient report, symptoms could improve. -Pt referred to pulm 6/19 for sleep study. Unclear if patient followed up on referral. -Per chart, he was unable to tolerate CPAP and declines further treatment. -Per cardiology note, pt has CHF (combined) as well, some symptoms may overlap. He is on Lasix. -No active tobacco use. Quit 2013. -No PFTs found. No CAT score documented. -Current treatment  Symbicort 160-4.5 mcg/act - 2 puff BID (takes PRN) Albuterol PRN  -Medications previously tried: none reported -Exacerbations requiring treatment in last 6 months: none -Patient denies consistent use of maintenance inhaler. He uses Symbicort PRN -Frequency of rescue inhaler use: ~ 1-2 x/week -Counseled on Differences between maintenance and rescue inhalers -Recommended to continue current medication for now. Will complete CAT score at 2 week follow up.  Depression/Insomnia (Goal: Improve mood, sleep) -Not ideally controlled - Pt reports depressed mood. He does not feel citalopram alone is effective anymore. He inquired about starting back on bupropion. He also presents concern about ED. States he is unable to take Viagra or similar due to his heart medication (nitrates). Prior saw urology but not interested in procedural intervention.  -Current treatment: Citalopram 40 mg - 1 tablet daily   -Medications previously tried/failed: Lexapro 10 mg  and Bupropion 150 mg BID --> switched to citalopram 09/2019, increased citalopram to 40 mg 08/21 -PHQ9: 0 (10/06/20) -GAD7: n/a -Follow up 2 weeks for PHQ-9 and GAD-7. Discuss mental health history, sleep, and ED history.  OTCs: Stool softener - pt taking daily, asked him to d/c due to diarrhea  Calcium/vitamin d - daily Vitamin c - daily  Tylenol 325 mg - takes BID for pain, works well   Other Tamsulosin - 1 BID, for prostate symptoms (per urology, may taper off is symptoms controlled) Gabapentin 300 mg - 1 BID, for nerve pain (for DM neuropathy, query effective) Pantoprazole 40 mg - 1 daily, for heart burn (for GERD, works well)  Patient Goals/Self-Care Activities Patient will:  - take medications as prescribed check glucose 3 days per week, document, and provide at future appointments check blood pressure 3 days per week, document, and provide at future appointments  Follow Up Plan: Telephone follow up appointment with care management team member scheduled for:  2 week phone call      Medication Assistance: None required.  Patient affirms current coverage meets needs.  Compliance/Adherence/Medication fill history: Care Gaps: Diabetic foot exam - pt reports this has been completed in last 12 months  Star-Rating Drugs: Jardiance 92m          11/09/20            90 Metformin 5064m       11/22/20  90 Januvia 136m           11/29/20            90 Rosuvastatin 226m    11/28/20            90 Lisinopril 2.57m64m         10/16/20              90  Patient's [facility]  [id number] [place] [address] [id number]: [phone]: [phone] pill box? Yes, girlfriend fills his pillbox weekly Pt endorses 100% compliance  We discussed: Benefits of medication synchronization, packaging and delivery as well as enhanced pharmacist oversight  with [employer]. Patient decided to: Continue current medication management strategy -He would like to consider pill packs.  Care Plan and Follow Up Patient Decision:  Patient agrees to Care Plan and Follow-up.  MichDebbora DusarmD Clinical Pharmacist [facility]-[phone]

## 2020-12-08 ENCOUNTER — Ambulatory Visit (INDEPENDENT_AMBULATORY_CARE_PROVIDER_SITE_OTHER): Payer: Medicare Other

## 2020-12-08 DIAGNOSIS — I442 Atrioventricular block, complete: Secondary | ICD-10-CM

## 2020-12-08 LAB — CUP PACEART REMOTE DEVICE CHECK
Battery Impedance: 823 Ohm
Battery Remaining Longevity: 74 mo
Battery Voltage: 2.78 V
Brady Statistic RV Percent Paced: 99 %
Date Time Interrogation Session: 20220824102344
Implantable Lead Implant Date: 20141010
Implantable Lead Implant Date: 20141010
Implantable Lead Location: 753859
Implantable Lead Location: 753860
Implantable Lead Model: 5076
Implantable Lead Model: 5076
Implantable Pulse Generator Implant Date: 20141010
Lead Channel Impedance Value: 662 Ohm
Lead Channel Pacing Threshold Amplitude: 0.875 V
Lead Channel Pacing Threshold Pulse Width: 0.4 ms
Lead Channel Setting Pacing Amplitude: 2.5 V
Lead Channel Setting Pacing Pulse Width: 0.4 ms
Lead Channel Setting Sensing Sensitivity: 4 mV

## 2020-12-13 ENCOUNTER — Other Ambulatory Visit (HOSPITAL_COMMUNITY): Payer: Medicare Other

## 2020-12-14 ENCOUNTER — Telehealth: Payer: Self-pay

## 2020-12-14 ENCOUNTER — Other Ambulatory Visit: Payer: Self-pay | Admitting: Primary Care

## 2020-12-14 DIAGNOSIS — E119 Type 2 diabetes mellitus without complications: Secondary | ICD-10-CM

## 2020-12-14 NOTE — Telephone Encounter (Signed)
Attempted to reach patient to verify metformin dose. He was driving and will call back.  Debbora Dus, PharmD Clinical Pharmacist [facility] at [facility] [phone]

## 2020-12-14 NOTE — Patient Instructions (Signed)
December 07, 2020  Dear [name],  It was a pleasure meeting you during our initial appointment on December 07, 2020. Below is a summary of the goals we discussed and components of chronic care management. Please contact me anytime with questions or concerns.   Visit Information  Patient Care Plan: CCM Pharmacy Care Plan     Problem Identified: Disease Management   Priority: High  Onset Date: 12/07/2020  Note:   Current Barriers:  Chief complaint - ongoing diarrhea, depressed mood, ED   Pharmacist Clinical Goal(s):  Patient will contact provider office for questions/concerns as evidenced notation of same in electronic health record through collaboration with PharmD and provider.   Interventions: 1:1 collaboration with [name], NP regarding development and update of comprehensive plan of care as evidenced by provider attestation and co-signature Inter-disciplinary care team collaboration (see longitudinal plan of care) Comprehensive medication review performed; medication list updated in electronic medical record  Hypertension (BP goal <130/80) -Controlled - per clinic readings -Current treatment: Lisinopril 2.5 mg - 1 tablet daily Metoprolol succinate 25 mg - 1 /2 tablet daily Furosemide 20 mg - 1 daily  -He did not have Lasix with him today. Reminded him to check for bottle when he got home. States he needs a refill. Checks weigh 3 days/week and weight steady at 223 lbs.  -Medications previously tried: none reported  -Current home readings: reports BP was 130/85, 91 (yesterday - before meds), he checks weight, BP and oxygen 3 days a week on tablet sent from [facility] -Current dietary habits: not discussed  -Current exercise habits: "busy bee", stays active -Denies hypotensive/hypertensive symptoms -Educated on BP goals and benefits of medications for prevention of heart attack, stroke and kidney damage; -Counseled to monitor BP at home as instructed 2 days per week,  document, and provide log at future appointments -Recommended to continue current medication; Verify you are taking furosemide daily.   Hyperlipidemia/CAD: (LDL goal < 70) -Controlled - LDL 55 -CAD, stent Feb 2020, followed by cardiology -Pt denies any chest pain or abnormal symptoms. Affirms daily adherence.  -Current treatment: Rosuvastatin 20 mg - 1 tablet daily Fish Oil 1000 mg - daily Plavix 75 mg - 1 tablet daily Isosorbide Mononitrate 60 mg - 1 tablet daily Metoprolol succinate 25 mg -1/2 daily Nitroglycerin - PRN  -Medications previously tried: none reported  -Educated on Cholesterol goals;  -Recommended to continue current medication  Diabetes (A1c goal <7%) -Not ideally controlled - A1c 7.6% 06/22 - metformin increased after last A1c elevation -Current medications: Januvia 100 mg - 1 tablet daly Jardiance 10 mg - 1 tablet daily Metformin 500 mg - 2 tablet in morning and 1 evening  (dose increased 10/09/20) -Medications previously tried: none reported  -Current home glucose readings - checks fasting 3 days per week fasting glucose: 157 yesterday, usually 130s post prandial glucose: none  -Denies hypoglycemic/hyperglycemic symptoms -Educated on A1c and blood sugar goals; -No cost concerns  -Pt has had urgent diarrhea on/off the past few months. It improved while on antibiotics but has worsened again. It is unclear whether this is caused by metformin dose increase. He is not entirely clear on his current metformin dose (bottle he brought today has old directions for 1000 mg/day instead of 1500 mg). Need to verify with [name]. -Counseled to check feet daily and get yearly eye exams - due for foot exam. Pt reports he had feet checked recently and no abnormalities. He does have neuropathy. Taking gabapentin BID. It helps  but does not completely resolve symptoms.  -Recommended to verify home dose of metformin. Stop taking OTC stool softener. Follow up 2 weeks to see if diarrhea has  resolved.  Atrial Fibrillation (Goal: prevent stroke and major bleeding) -Controlled - per patient report no symptoms, INR stable -Followed by cardiology, INR per [facility] 11/22/20 within range -CHADSVASC: 5 -Current treatment: Rate control: Metoprolol succinate 25 mg -1 /2 tablet daily Anticoagulation: Wafarin 5 mg - as directed -Medications previously tried: none reported - no DOAC history due to cost concern -Home BP and HR readings: see HTN section -Counseled on importance of adherence to anticoagulant exactly as prescribed; avoidance of NSAIDs due to increased bleeding risk with anticoagulants; -Recommended to continue current medication  COPD/OSA (Goal: control symptoms and prevent exacerbations) -Not ideally controlled - per patient report, symptoms could improve. -Pt referred to pulm 6/19 for sleep study. Unclear if patient followed up on referral. -Per chart, he was unable to tolerate CPAP and declines further treatment. -Per cardiology note, pt has CHF (combined) as well, some symptoms may overlap. He is on Lasix. -No active tobacco use. Quit 2013. -No PFTs found. No CAT score documented. -Current treatment  Symbicort 160-4.5 mcg/act - 2 puff BID (takes PRN) Albuterol PRN  -Medications previously tried: none reported -Exacerbations requiring treatment in last 6 months: none -Patient denies consistent use of maintenance inhaler. He uses Symbicort PRN -Frequency of rescue inhaler use: ~ 1-2 x/week -Counseled on Differences between maintenance and rescue inhalers -Recommended to continue current medication for now. Will complete CAT score at 2 week follow up.  Depression/Insomnia (Goal: Improve mood, sleep) -Not ideally controlled - Pt reports depressed mood. He does not feel citalopram alone is effective anymore. He inquired about starting back on bupropion. He also presents concern about ED. States he is unable to take Viagra or similar due to his heart medication (nitrates).  Prior saw urology but not interested in procedural intervention.  -Current treatment: Citalopram 40 mg - 1 tablet daily  -Medications previously tried/failed: Lexapro 10 mg  and Bupropion 150 mg BID --> switched to citalopram 09/2019, increased citalopram to 40 mg 08/21 -PHQ9: 0 (10/06/20) -GAD7: n/a -Follow up 2 weeks for PHQ-9 and GAD-7. Discuss mental health history, sleep, and ED history.  OTCs: Stool softener - pt taking daily, asked him to d/c due to diarrhea  Calcium/vitamin d - daily Vitamin c - daily  Tylenol 325 mg - takes BID for pain, works well   Other Tamsulosin - 1 BID, for prostate symptoms (per urology, may taper off is symptoms controlled) Gabapentin 300 mg - 1 BID, for nerve pain (for DM neuropathy, query effective) Pantoprazole 40 mg - 1 daily, for heart burn (for GERD, works well)  Patient Goals/Self-Care Activities Patient will:  - take medications as prescribed check glucose 3 days per week, document, and provide at future appointments check blood pressure 3 days per week, document, and provide at future appointments  Follow Up Plan: Telephone follow up appointment with care management team member scheduled for:  2 week phone call      Mr. Decrane was given information about Chronic Care Management services today including:  CCM service includes personalized support from designated clinical staff supervised by his physician, including individualized plan of care and coordination with other care providers 24/7 contact phone numbers for assistance for urgent and routine care needs. Standard insurance, coinsurance, copays and deductibles apply for chronic care management only during months in which we provide at least 20 minutes of  these services. Most insurances cover these services at 100%, however patients may be responsible for any copay, coinsurance and/or deductible if applicable. This service may help you avoid the need for more expensive face-to-face  services. Only one practitioner may furnish and bill the service in a calendar month. The patient may stop CCM services at any time (effective at the end of the month) by phone call to the office staff.  Patient agreed to services and verbal consent obtained.   Patient verbalizes understanding of instructions provided today and agrees to view in [place].   Debbora Dus, PharmD Clinical Pharmacist [facility] at [facility] [phone]

## 2020-12-17 ENCOUNTER — Telehealth: Payer: Self-pay

## 2020-12-17 NOTE — Chronic Care Management (AMB) (Addendum)
Chronic Care Management Pharmacy Assistant   Name: [name] Cook  MRN: [id number] DOB: [birth year]   Reason for Encounter: CCM Reminder Call   Conditions to be addressed/monitored: HTN, HLD, DMII, and CKD Stage 2  Medications: Outpatient Encounter Medications as of 12/17/2020  Medication Sig   albuterol (VENTOLIN HFA) 108 (90 Base) MCG/ACT inhaler INHALE 2 PUFFS BY MOUTH EVERY 4 HOURS AS NEEDED FOR WHEEZE OR FOR SHORTNESS OF BREATH   citalopram (CELEXA) 40 MG tablet TAKE 1 TABLET (40 MG TOTAL) BY MOUTH DAILY. FOR DEPRESSION.   clopidogrel (PLAVIX) 75 MG tablet TAKE 1 TABLET BY MOUTH EVERY DAY   diclofenac Sodium (VOLTAREN) 1 % GEL Apply 2 g topically 4 (four) times daily. (Patient not taking: Reported on 12/07/2020)   empagliflozin (JARDIANCE) 10 MG TABS tablet TAKE 1 TABLET BY MOUTH EVERY MORNING FOR DIABETES   fish oil-omega-3 fatty acids 1000 MG capsule Take 1 g by mouth daily.     fluticasone (FLONASE) 50 MCG/ACT nasal spray PLACE 1 SPRAY INTO BOTH NOSTRILS DAILY AS NEEDED FOR ALLERGIES OR RHINITIS.   furosemide (LASIX) 20 MG tablet TAKE 1 TABLET BY MOUTH EVERY DAY   gabapentin (NEURONTIN) 300 MG capsule Take 1 capsule (300 mg total) by mouth 2 (two) times daily. For neuropathy   glucose blood (ONE TOUCH ULTRA TEST) test strip USE TO TEST UP TO 4 TIMES DAILY   isosorbide mononitrate (IMDUR) 60 MG 24 hr tablet TAKE 1 TABLET BY MOUTH EVERY DAY   lisinopril (ZESTRIL) 2.5 MG tablet TAKE 1 TABLET BY MOUTH EVERY DAY   metFORMIN (GLUCOPHAGE) 500 MG tablet Take 2 tablets by mouth every morning with breakfast and 1 take tablet by mouth every evening with dinner for diabetes   metoprolol succinate (TOPROL-XL) 25 MG 24 hr tablet Take 0.5 tablets (12.5 mg total) by mouth daily.   nitroGLYCERIN (NITROSTAT) 0.4 MG SL tablet Place 1 tablet (0.4 mg total) under the tongue every 5 (five) minutes as needed for chest pain.   pantoprazole (PROTONIX) 40 MG tablet TAKE 1 TABLET (40 MG TOTAL) BY MOUTH DAILY  FOR HEARTBURN.   rosuvastatin (CRESTOR) 20 MG tablet TAKE 1 TABLET BY MOUTH EVERY DAY IN THE EVENING FOR CHOLESTEROL   sitaGLIPtin (JANUVIA) 100 MG tablet TAKE 1 TABLET BY MOUTH ONCE DAILY FOR DIABETES   SYMBICORT 160-4.5 MCG/ACT inhaler INHALE 2 PUFFS INTO THE LUNGS TWICE A DAY   tamsulosin (FLOMAX) 0.4 MG CAPS capsule TAKE 1 CAPSULE (0.4 MG TOTAL) BY MOUTH 2 (TWO) TIMES DAILY.   warfarin (COUMADIN) 5 MG tablet TAKE 1/2 TABLET DAILY EXCEPT TAKE 1 TABLET ON MONDAYS, THURSDAYS & Saturdays OR AS DIRECTED BY COUMADIN CLINIC   No facility-administered encounter medications on file as of 12/17/2020.    [name]  did not answer the telephone to remind him of his upcoming telephone visit with Debbora Dus on 12/21/20 at 3:00pm. Patient was reminded to have all medications, supplements and any blood glucose and blood pressure readings available for review at appointment.    Star Rating Drugs: Medication:  Last Fill: Day Supply Januvia '100mg'$  11/29/20 90 Jardiance '10mg'$  11/09/20 90 Metformin '500mg'$  11/22/20  90 Rosuvastatin '20mg'$  11/28/20 90 Lisinopril 2.'5mg'$  10/16/20  [place], CPP notified  Avel Sensor, [place] Assistant [phone]  I have reviewed the care management and care coordination activities outlined in this encounter and I am certifying that I agree with the content of this note. No further action required.  Debbora Dus, PharmD  Clinical Pharmacist [facility] at [facility] [phone]

## 2020-12-21 ENCOUNTER — Other Ambulatory Visit: Payer: Self-pay

## 2020-12-21 ENCOUNTER — Ambulatory Visit (INDEPENDENT_AMBULATORY_CARE_PROVIDER_SITE_OTHER): Payer: Medicare Other

## 2020-12-21 DIAGNOSIS — E119 Type 2 diabetes mellitus without complications: Secondary | ICD-10-CM

## 2020-12-21 DIAGNOSIS — F339 Major depressive disorder, recurrent, unspecified: Secondary | ICD-10-CM

## 2020-12-21 NOTE — Progress Notes (Signed)
Chronic Care Management Pharmacy Note  12/21/20 Name:  [name] Cook MRN:  [id number] DOB:  03-11-1951  Summary: Patient reports depressed mood. Consulted PCP, she would like patient to schedule visit. He was notified. Diarrhea improving off stool softener. Patient confirms understanding of how to use maintenance inhaler for rescue for COPD. No cost or adherence concerns today.  Recommendations/Changes made from today's visit: None  Plan: CCM visit 6 months    Subjective: [name] Cook is an 70 y.o. year old male who is a primary patient of [name], NP.  The CCM team was consulted for assistance with disease management and care coordination needs.    Engaged with patient by telephone for follow up visit in response to provider referral for pharmacy case management and/or care coordination services.   Consent to Services:  The patient was given information about Chronic Care Management services, agreed to services, and gave verbal consent prior to initiation of services.  Please see initial visit note for detailed documentation.   Patient Care Team: [name], NP as PCP - General (Internal Medicine) [name], MD as PCP - Cardiology (Cardiology) [name], MD as PCP - Electrophysiology (Cardiology) Rothbart, Cristopher Estimable, MD (Inactive) (Cardiology) Noreene Filbert, MD as Radiation Oncologist (Radiation Oncology) Debbora Dus, [facility] as Pharmacist (Pharmacist)  Recent office visits: 11/22/20 - Family Medicine - Patient presented for follow up abdomen pain. Improving on current regimen. Advised to finish taking the antibiotic as prescribed earlier. Follow up if symptoms do not improve.  Recent consult visits:  11/23/20 - Cardiology - Patient presented for routine electrophysiology follow up. No medication changes. 06/15/20 - Urology - Patient presented for follow up prostate cancer. Currently on Flomax twice daily, may titrate off if his symptoms are well  controlled.  Hospital visits: None in previous 6 months   Objective:  Lab Results  Component Value Date   CREATININE 1.11 11/16/2020   BUN 19 11/16/2020   GFR 68.24 11/16/2020   GFRNONAA >60 01/23/2020   GFRAA 72 06/23/2019   NA 139 11/16/2020   K 4.0 11/16/2020   CALCIUM 9.8 11/16/2020   CO2 25 11/16/2020   GLUCOSE 212 (H) 11/16/2020    Lab Results  Component Value Date/Time   HGBA1C 7.6 (H) 10/07/2020 10:18 AM   HGBA1C 6.5 (A) 01/07/2020 08:55 AM   HGBA1C 7.1 (H) 10/07/2019 10:41 AM   HGBA1C 6.4 (H) 11/27/2016 11:33 AM   HGBA1C 7.1 (H) 08/21/2016 10:25 AM   GFR 68.24 11/16/2020 12:30 PM   GFR 64.77 10/07/2020 10:18 AM   MICROALBUR <0.7 09/21/2017 01:06 PM   MICROALBUR <0.2 11/27/2016 11:33 AM    Last diabetic Eye exam:  Lab Results  Component Value Date/Time   HMDIABEYEEXA No Retinopathy 08/28/2020 12:00 AM    Last diabetic Foot exam: 09/2019 PCP  Lab Results  Component Value Date   CHOL 131 10/07/2020   HDL 43.90 10/07/2020   LDLCALC 55 10/07/2020   TRIG 161.0 (H) 10/07/2020   CHOLHDL 3 10/07/2020    Hepatic Function Latest Ref Rng & Units 11/16/2020 10/07/2020 01/23/2020  Total Protein 6.0 - 8.3 g/dL 6.6 6.5 7.2  Albumin 3.5 - 5.2 g/dL 4.2 4.3 4.3  AST 0 - 37 U/L '21 21 20  ' ALT 0 - 53 U/L '20 20 19  ' Alk Phosphatase 39 - 117 U/L 44 51 55  Total Bilirubin 0.2 - 1.2 mg/dL 0.6 0.6 0.9  Bilirubin, Direct 0.00 - 0.40 mg/dL - - -  Lab Results  Component Value Date/Time   TSH 2.030 04/15/2019 10:05 AM   TSH 1.93 10/27/2015 10:54 AM   FREET4 1.38 03/14/2013 11:12 AM   FREET4 1.11 05/10/2010 10:02 AM    CBC Latest Ref Rng & Units 11/16/2020 10/07/2020 01/23/2020  WBC 4.0 - 10.5 K/uL 4.7 4.9 7.3  Hemoglobin 13.0 - 17.0 g/dL 14.6 15.3 15.6  Hematocrit 39.0 - 52.0 % 43.9 44.8 45.2  Platelets 150.0 - 400.0 K/uL 118.0(L) 109.0 Repeated and verified X2.(L) 128(L)    Lab Results  Component Value Date/Time   VD25OH 32 10/27/2015 10:54 AM    Clinical ASCVD:  Yes  The ASCVD Risk score (Arnett DK, et al., 2019) failed to calculate for the following reasons:   The patient has a prior MI or stroke diagnosis    Depression screen [facility] 2/9 12/30/2020 12/21/2020 10/07/2020  Decreased Interest 2 0 0  Down, Depressed, Hopeless 0 0 0  PHQ - 2 Score 2 0 0  Altered sleeping 3 - 0  Tired, decreased energy 3 - 0  Change in appetite 0 - 0  Feeling bad or failure about yourself  0 - 0  Trouble concentrating 0 - 0  Moving slowly or fidgety/restless 0 - 0  Suicidal thoughts 0 - 0  PHQ-9 Score 8 - 0  Difficult doing work/chores Not difficult at all - Not difficult at all  Some recent data might be hidden     Social History   Tobacco Use  Smoking Status Former   Packs/day: 1.00   Years: 42.00   Pack years: 42.00   Types: Cigarettes   Quit date: 12/31/2011   Years since quitting: 9.0  Smokeless Tobacco Never   BP Readings from Last 3 Encounters:  01/13/21 108/76  12/30/20 110/62  11/23/20 112/62   Pulse Readings from Last 3 Encounters:  01/13/21 84  12/30/20 98  11/23/20 88   Wt Readings from Last 3 Encounters:  01/13/21 228 lb 3.2 oz (103.5 kg)  12/30/20 228 lb (103.4 kg)  11/23/20 228 lb 12.8 oz (103.8 kg)   BMI Readings from Last 3 Encounters:  01/13/21 31.83 kg/m  12/30/20 31.80 kg/m  11/23/20 31.91 kg/m    Assessment/Interventions: Review of patient past medical history, allergies, medications, health status, including review of consultants reports, laboratory and other test data, was performed as part of comprehensive evaluation and provision of chronic care management services.   SDOH:  (Social Determinants of Health) assessments and interventions performed: Yes   SDOH Screenings   Alcohol Screen: Low Risk    Last Alcohol Screening Score (AUDIT): 0  Depression (PHQ2-9): Medium Risk   PHQ-2 Score: 8  Financial Resource Strain: Low Risk    Difficulty of Paying Living Expenses: Not very hard  Food Insecurity: No Food  Insecurity   Worried About Charity fundraiser in the Last Year: Never true   Ran Out of Food in the Last Year: Never true  Housing: Low Risk    Last Housing Risk Score: 0  Physical Activity: Inactive   Days of Exercise per Week: 0 days   Minutes of Exercise per Session: 0 min  Social Connections: Not on file  Stress: No Stress Concern Present   Feeling of Stress : Not at all  Tobacco Use: Medium Risk   Smoking Tobacco Use: Former   Smokeless Tobacco Use: Never  Transportation Needs: No Data processing manager (Medical): No   Lack of Transportation (Non-Medical): No  CCM Care Plan  No Known Allergies  Medications Reviewed Today     Reviewed by Velna Hatchet, CMA (Certified Medical Assistant) on 01/13/21 at 50  Med List Status: <None>   Medication Order Taking? Sig Documenting Provider Last Dose Status Informant  albuterol (VENTOLIN HFA) 108 (90 Base) MCG/ACT inhaler [id number]  INHALE 2 PUFFS BY MOUTH EVERY 4 HOURS AS NEEDED FOR WHEEZE OR FOR SHORTNESS OF Nicholes Stairs, NP  Active   budesonide-formoterol [facility]) 160-4.5 MCG/ACT inhaler [id number]  INHALE 2 PUFFS INTO THE LUNGS TWICE A DAY [name], NP  Active   clopidogrel (PLAVIX) 75 MG tablet [id number]  TAKE 1 TABLET BY MOUTH EVERY DAY [name], MD  Active   diclofenac Sodium (VOLTAREN) 1 % GEL [id number]  Apply 2 g topically 4 (four) times daily. Delia Heady, PA-C  Active   DULoxetine (CYMBALTA) 30 MG capsule [id number]  Take 1 capsule (30 mg total) by mouth daily. For depression. [name], NP  Active   empagliflozin (JARDIANCE) 10 MG TABS tablet [id number]  TAKE 1 TABLET BY MOUTH EVERY MORNING FOR DIABETES [name], NP  Active   fish oil-omega-3 fatty acids 1000 MG capsule [id number]  Take 1 g by mouth daily.   [provider]  Active Self  fluticasone (FLONASE) 50 MCG/ACT nasal spray [id number]  PLACE 1 SPRAY INTO BOTH NOSTRILS DAILY AS  NEEDED FOR ALLERGIES OR RHINITIS. Alycia Rossetti, MD  Active   furosemide (LASIX) 20 MG tablet [id number]  TAKE 1 TABLET BY MOUTH EVERY DAY [name], MD  Active   gabapentin (NEURONTIN) 300 MG capsule [id number]  Take 1 capsule (300 mg total) by mouth 2 (two) times daily. For neuropathy [name], NP  Active   glucose blood (ONE TOUCH ULTRA TEST) test strip [id number]  USE TO TEST UP TO 4 TIMES DAILY [name], NP  Active   isosorbide mononitrate (IMDUR) 60 MG 24 hr tablet [id number]  TAKE 1 TABLET BY MOUTH EVERY DAY [name], MD  Active   lisinopril (ZESTRIL) 2.5 MG tablet [id number]  TAKE 1 TABLET BY MOUTH EVERY DAY [name], MD  Active   metFORMIN (GLUCOPHAGE-XR) 500 MG 24 hr tablet [id number]  Take 2 tablets (1,000 mg total) by mouth 2 (two) times daily with a meal. For diabetes. [name], NP  Active   metoprolol succinate (TOPROL-XL) 25 MG 24 hr tablet [id number]  Take 0.5 tablets (12.5 mg total) by mouth daily. Baldwin [place], PA-C  Active   nitroGLYCERIN (NITROSTAT) 0.4 MG SL tablet [id number]  Place 1 tablet (0.4 mg total) under the tongue every 5 (five) minutes as needed for chest pain. [name], NP  Active   pantoprazole (PROTONIX) 40 MG tablet [id number]  TAKE 1 TABLET (40 MG TOTAL) BY MOUTH DAILY FOR HEARTBURN. [name], NP  Active   rosuvastatin (CRESTOR) 20 MG tablet [id number]  TAKE 1 TABLET BY MOUTH EVERY DAY IN THE EVENING FOR CHOLESTEROL [name], NP  Active   sitaGLIPtin (JANUVIA) 100 MG tablet [id number]  TAKE 1 TABLET BY MOUTH ONCE DAILY FOR DIABETES [name], NP  Active   tamsulosin (FLOMAX) 0.4 MG CAPS capsule [id number]  TAKE 1 CAPSULE (0.4 MG TOTAL) BY MOUTH 2 (TWO) TIMES DAILY. Noreene Filbert, MD  Active   warfarin (COUMADIN) 5 MG tablet [id number]  TAKE 1/2 TABLET DAILY EXCEPT TAKE 1 TABLET ON MONDAYS, THURSDAYS & Saturdays OR AS  DIRECTED BY COUMADIN CLINIC [name], NP  Active              Patient Active Problem List   Diagnosis Date Noted   Chest wall pain 11/22/2020   Left lower quadrant abdominal pain 10/26/2020   Acute diverticulitis 02/06/2020   Preventative health care 10/09/2019   Ear canal dryness, bilateral 08/12/2019   Polyp of colon    Decreased renal function 06/27/2018   Prostate cancer ([place]) 06/10/2018   Chest pain 06/04/2018   OSA (obstructive sleep apnea) 10/01/2017   Encounter for screening colonoscopy 10/01/2017   Osteoarthritis 06/26/2017   Insomnia 10/01/2014   Depression 03/09/2014   Coronary artery disease 11/06/2013   Long term current use of anticoagulant therapy 06/19/2013   Pacemaker 04/30/2013   Acute myocardial infarction, subendocardial infarction, initial episode of care ([place]) 03/15/2013   Abnormal LFTs 05/02/2012   Chronic kidney disease, 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)    GASTROESOPHAGEAL REFLUX DISEASE 05/16/2010    Immunization History  Administered Date(s) Administered   Influenza, High Dose Seasonal PF 02/20/2018, 02/03/2019   Influenza,inj,Quad PF,6+ Mos 02/05/2014, 01/04/2015, 04/20/2016   Influenza-Unspecified 01/05/2011, 01/15/2013, 01/30/2020   PFIZER(Purple Top)SARS-COV-2 Vaccination 09/17/2019, 10/08/2019   Pneumococcal Conjugate-13 09/21/2017   Pneumococcal Polysaccharide-23 01/05/2011   Tdap 01/05/2011    Conditions to be addressed/monitored:  Diabetes, COPD, and Depression  Care Plan : [place]  Updates made by Debbora Dus, [facility] since 01/20/2021 12:00 AM     Problem: Disease Management   Priority: High  Onset Date: 12/07/2020  Note:   Current Barriers:  Depressed mood   Pharmacist Clinical Goal(s):  Patient will contact provider office for questions/concerns as evidenced notation of same in  electronic health record through collaboration with PharmD and provider.   Interventions: 1:1 collaboration with [name], NP regarding development and update of comprehensive plan of care as evidenced by provider attestation and co-signature Inter-disciplinary care team collaboration (see longitudinal plan of care) Comprehensive medication review performed; medication list updated in electronic medical record  Diabetes (A1c goal <7%) -Unable to assess - A1c 7.6% 06/22 - metformin increased after last A1c elevation. He only reports 1 reading today and it was elevated after lunch. Diarrhea mostly resolved off stool softener. No cost concerns. -Current medications: Januvia 100 mg - 1 tablet daly Jardiance 10 mg - 1 tablet daily Metformin 500 mg - 2 tablet in morning and 1 evening  (dose increased 10/09/20) -Medications previously tried: none reported  -Current home glucose readings - checks fasting 3 days per week fasting glucose: none  Post prandial: 220 after lunch yesterday -Denies hypoglycemic/hyperglycemic symptoms -Recommend continue current medications; Keep record of daily glucose readings.  COPD/OSA (Goal: control symptoms and prevent exacerbations) -Not ideally controlled - per patient report, symptoms could improve. -Pt referred to pulm 6/19 for sleep study. Unclear if patient followed up on referral. -Per chart, he was unable to tolerate CPAP and declines further treatment. -Per cardiology note, pt has CHF (combined) as well, some symptoms may overlap. He is on Lasix. -No active tobacco use. Quit 2013. -No PFTs found. No CAT score documented. -Current treatment  Symbicort 160-4.5 mcg/act - 2 puff BID (takes PRN) Albuterol PRN  -Medications previously tried: none reported -Exacerbations requiring treatment in last 6 months: none -Patient  denies consistent use of maintenance inhaler. He uses Symbicort PRN -Frequency of rescue inhaler use: ~ 1-2 x/week -Counseled on  Differences between maintenance and rescue inhalers -Recommended to continue current medication for now. Encouraged to use Symbicort daily if symptoms not well controlled.  Depression/Insomnia (Goal: Improve mood, sleep) -Not ideally controlled - per patient report -Reports easily aggravated and does not feel citalopram works as well as before. -Sleeps from about 10 PM to 5 AM most nights without interruption. Generally feels rested in morning. -Caffeine: 2 cups coffee, drinks coke zero - 2 cans/day. No alcohol or tobacco use -Current treatment: Citalopram 40 mg - 1 tablet daily  -Medications previously tried/failed: Lexapro 10 mg  and Bupropion 150 mg BID --> switched to citalopram 09/2019, increased citalopram to 40 mg 08/21 -PHQ9: 0 (today) -GAD7: 6 (today) -Consulted PCP - she would like to schedule visit with patient to discuss.  Patient Goals/Self-Care Activities Patient will:  - take medications as prescribed check glucose 3 days per week, document, and provide at future appointments check blood pressure 3 days per week, document, and provide at future appointments  Follow Up Plan: Telephone follow up appointment with care management team member scheduled for:  -Pt will schedule PCP visit to discuss depression -CCM visit 6 months       Medication Assistance: None required.  Patient affirms current coverage meets needs.  Compliance/Adherence/Medication fill history: Care Gaps: Diabetic foot exam - pt reports this has been completed in last 12 months  Star-Rating Drugs: Jardiance 81m          11/09/20            90 Metformin 5066m       11/22/20              90 Januvia 10075m         11/29/20            90 Rosuvastatin 63m53m  11/28/20            90 Lisinopril 2.5mg 89m       10/16/20              90  Patient's preferred pharmacy is:  [id number], [place] [address] [address]: [phone] pill box? Yes, girlfriend fills his pillbox weekly Pt endorses 100% compliance  We discussed: Benefits of medication synchronization, packaging and delivery as well as enhanced pharmacist oversight with [employer]. Patient decided to: Continue current medication management strategy -He would like to consider pill packs.  Care Plan and Follow Up Patient Decision:  Patient agrees to Care Plan and Follow-up.    MichelDebbora DusmD Clinical Pharmacist [phone]

## 2020-12-23 ENCOUNTER — Other Ambulatory Visit: Payer: Self-pay | Admitting: Primary Care

## 2020-12-23 ENCOUNTER — Ambulatory Visit: Payer: Medicare Other

## 2020-12-23 NOTE — Progress Notes (Signed)
Remote pacemaker transmission.   

## 2020-12-24 ENCOUNTER — Ambulatory Visit (INDEPENDENT_AMBULATORY_CARE_PROVIDER_SITE_OTHER): Payer: Medicare Other

## 2020-12-24 DIAGNOSIS — Z7901 Long term (current) use of anticoagulants: Secondary | ICD-10-CM | POA: Diagnosis not present

## 2020-12-24 LAB — POCT INR: INR: 2.2 (ref 2.0–3.0)

## 2020-12-24 NOTE — Patient Instructions (Addendum)
Pre visit review using our clinic review tool, if applicable. No additional management support is needed unless otherwise documented below in the visit note.  Continue taking 2.'5mg'$  daily except take '5mg'$  on Mondays, Thursdays and Saturdays . Recheck in 4 wks at [facility], [address]

## 2020-12-29 ENCOUNTER — Telehealth: Payer: Self-pay

## 2020-12-29 NOTE — Telephone Encounter (Signed)
Called patient and got him scheduled for 9/15 '@1140'$ 

## 2020-12-29 NOTE — Telephone Encounter (Signed)
[place] Night - Client Nonclinical Telephone Record AccessNurse Client [place] [facility] - Client Client Site [place] Physician Alma Friendly - NP Contact Type Call Who Is Calling Patient / Member / Family / Caregiver Caller Name Todd Phone Number N/A Patient Name [name] Cook Patient DOB [birth year] Call Type Message Only Information Provided Reason for Call Request to Schedule Office Appointment Initial Comment Caller states he is needing to schedule an appointment. hours information provided Patient request to speak to RN No Disp. Time Disposition Final User 12/29/2020 7:44:24 AM General Information Provided Yes Domingo Dimes, [place] Call Closed By: Memory Argue Transaction Date/Time: 12/29/2020 7:42:52 AM (ET)

## 2020-12-30 ENCOUNTER — Ambulatory Visit (INDEPENDENT_AMBULATORY_CARE_PROVIDER_SITE_OTHER): Payer: Medicare Other | Admitting: Primary Care

## 2020-12-30 ENCOUNTER — Other Ambulatory Visit: Payer: Self-pay

## 2020-12-30 ENCOUNTER — Encounter: Payer: Self-pay | Admitting: Primary Care

## 2020-12-30 VITALS — BP 110/62 | HR 98 | Temp 98.6°F | Ht 71.0 in | Wt 228.0 lb

## 2020-12-30 DIAGNOSIS — E119 Type 2 diabetes mellitus without complications: Secondary | ICD-10-CM | POA: Diagnosis not present

## 2020-12-30 DIAGNOSIS — F339 Major depressive disorder, recurrent, unspecified: Secondary | ICD-10-CM | POA: Diagnosis not present

## 2020-12-30 MED ORDER — METFORMIN HCL ER 500 MG PO TB24: 1000.0000 mg | ORAL_TABLET | Freq: Two times a day (BID) | ORAL | 1 refills | Status: DC

## 2020-12-30 MED ORDER — DULOXETINE HCL 30 MG PO CPEP: 30.0000 mg | ORAL_CAPSULE | Freq: Every day | ORAL | 0 refills | Status: DC

## 2020-12-30 NOTE — Assessment & Plan Note (Signed)
Glucose readings recently uncontrolled. Not due for A1C yet.   Will change metformin to ER version and increase dose. Rx for metformin ER 500 mg sent to pharmacy, take 2 tablets by mouth twice daily.   We will see him in 6 weeks for A1C and follow up.

## 2020-12-30 NOTE — Patient Instructions (Addendum)
We are changing your metformin medication for diabetes.  Stop regular metformin.  Start metformin ER 500 mg. Take 2 tablets by mouth twice daily with meals for diabetes.   We are changing your depression medication.   Cut your citalopram pill in half and take 1/2 tablet by mouth once daily for one week, then stop the citalopram pill for depression.   Start the duloxetine (Cymbalta) pill for depression now. You can start this while you take the citalopram pill for the one week.   Please schedule a follow up visit for 6 weeks for follow up of anxiety/depression and diabetes.  It was a pleasure to see you today!

## 2020-12-30 NOTE — Progress Notes (Signed)
Subjective:    Patient ID: [name] Cook, male    DOB: 27-Dec-1950, 70 y.o.   MRN: [id number]  HPI  [name] Cook is a very pleasant 70 y.o. male with a history of type 2 diabetes, prostate cancer, CKD, depression, hypertension who presents today to discuss depression and follow up from his visit with our pharmacist.   Currently managed on citalopram 40 mg for which he's been taking for over one year. During his visit in June 2022 he denied concerns for depression. During his visit with our pharmacist in August 2022 he endorsed depressed mood and didn't feel as though citalopram was effective.   He was previously managed on Lexapro, Prozac, and bupropion without improvement. The combination of SSRI and bupropion caused him to feel "weird".  Today he reports feeling tired, no energy, irritability, sleeping a lot. He denies feeling sad, anxious, nervous. He's not sure citalopram was ever effective. He is sleeping well, feels well rested when he wakes.   PHQ9 SCORE ONLY 12/30/2020 12/21/2020 10/07/2020  PHQ-9 Total Score 8 0 0   He is managed on metformin 1000 mg in AM and 500 mg PM for diabetes, endorses bothersome diarrhea several times weekly to the pharmacist during his recent visit. He admits this today. He is checking his glucose readings which are ranging 150-200's. He denies fevers, chills, abdomina pain, bloody stools.   BP Readings from Last 3 Encounters:  12/30/20 110/62  11/23/20 112/62  11/22/20 92/60      Review of Systems  Gastrointestinal:  Positive for diarrhea.  Psychiatric/Behavioral:  Negative for sleep disturbance.        Depression. See HPI        Past Medical History:  Diagnosis Date   Arthritis    "knees and lower back" (03/14/2013)   Atrial flutter ([place])    radiofrequency ablation in 2001   CAD (coronary artery disease)    a. Nonobstructive. Cardiac cath in 2001-50% mid RI, normal LM, LAD, RCA b. cath 10/16/2014 95% mid RCA treated with DES, 99% ost D1 medical  management due to small aneurysmal segment   Chronic anticoagulation    chronic Coumadin anticoagulation   Chronic obstructive pulmonary disease ([place]) 04/20/2011   Diabetes mellitus, type 2 (HCC)    GERD (gastroesophageal reflux disease)    Hyperlipidemia    Hypertension    with hypertensive heart disease   Left knee pain 10/25/2017   medial   Obesity    Persistent atrial fibrillation ([place])    recurrent atrial flutter since 2001 s/p DCCVs, multiple failed AADs, h/o tachy-mediated cardiomyopathy   Shortness of breath    "can come on at any time" (03/14/2013)   Sleep apnea    "dx'd; couldn't wear the mask" (03/14/2013)   Tobacco abuse     Social History   Socioeconomic History   Marital status: Widowed    Spouse name: Not on file   Number of children: 1   Years of education: Not on file   Highest education level: Not on file  Occupational History   Occupation: Unemployed    Employer: UNEMPLOYED  Tobacco Use   Smoking status: Former    Packs/day: 1.00    Years: 42.00    Pack years: 42.00    Types: Cigarettes    Quit date: 12/31/2011    Years since quitting: 9.0   Smokeless tobacco: Never  Vaping Use   Vaping Use: Never used  Substance and Sexual Activity   Alcohol use: Not Currently  Comment: 03/14/2013 "stopped drinking back in 2002; never had problem w/it"   Drug use: No   Sexual activity: Not Currently  Other Topics Concern   Not on file  Social History Narrative   Single.   Retired.    1 son, deceased.    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   Enjoys playing on the computer.       Social Determinants of Health   Financial Resource Strain: Low Risk    Difficulty of Paying Living Expenses: Not very hard  Food Insecurity: No Food Insecurity   Worried About Charity fundraiser in the Last Year: Never true   Ran Out of Food in the Last Year: Never true  Transportation Needs: No Transportation Needs   Lack of  Transportation (Medical): No   Lack of Transportation (Non-Medical): No  Physical Activity: Inactive   Days of Exercise per Week: 0 days   Minutes of Exercise per Session: 0 min  Stress: No Stress Concern Present   Feeling of Stress : Not at all  Social Connections: Not on file  Intimate Partner Violence: Not At Risk   Fear of Current or Ex-Partner: No   Emotionally Abused: No   Physically Abused: No   Sexually Abused: No    Past Surgical History:  Procedure Laterality Date   ATRIAL FLUTTER ABLATION  2002   atrial flutter; subsequently developed atrial fibrillation   AV NODE ABLATION  01/24/2013   CARDIAC CATHETERIZATION  2002   CARDIAC CATHETERIZATION N/A 10/16/2014   Procedure: Left Heart Cath and Coronary Angiography;  Surgeon: [name], MD;  Location: [facility];  Service: Cardiovascular;  Laterality: N/A;   CARDIOVERSION  05/31/2011   Procedure: CARDIOVERSION;  Surgeon: Cristopher Estimable. Lattie Haw, MD;  Location: [facility];  Service: Cardiovascular;  Laterality: N/A;   CARPAL TUNNEL RELEASE Left 1980's   COLONOSCOPY WITH PROPOFOL N/A 12/30/2018   Procedure: COLONOSCOPY WITH PROPOFOL;  Surgeon: Jonathon Bellows, MD;  Location: [facility] ENDOSCOPY;  Service: Gastroenterology;  Laterality: N/A;   COLONOSCOPY WITH PROPOFOL N/A 12/04/2019   Procedure: COLONOSCOPY WITH PROPOFOL;  Surgeon: Jonathon Bellows, MD;  Location: [facility] ENDOSCOPY;  Service: Gastroenterology;  Laterality: N/A;   INSERT / REPLACE / REMOVE PACEMAKER  01/24/2013    Medtronic Adapta L dual-chamber pacemaker, serial number [id number]    LEFT HEART CATH AND CORONARY ANGIOGRAPHY N/A 06/07/2018   Procedure: LEFT HEART CATH AND CORONARY ANGIOGRAPHY;  Surgeon: [name], MD;  Location: [facility];  Service: Cardiovascular;  Laterality: N/A;   LEFT HEART CATHETERIZATION WITH CORONARY ANGIOGRAM N/A 03/17/2013   Procedure: LEFT HEART CATHETERIZATION WITH CORONARY ANGIOGRAM;  Surgeon: Burnell Blanks, MD; LAD mild dz, D1  branch 100%, inferior branch 99%, CFX OK, RCA 50%, EF 65%     LOOP RECORDER IMPLANT  2002   PERMANENT PACEMAKER INSERTION N/A 01/24/2013   Procedure: PERMANENT PACEMAKER INSERTION;  Surgeon: [name], MD;  Location: [facility] CATH LAB;  Service: Cardiovascular;  Laterality: N/A;   TIBIAL TUBERCLERPLASTY  ~ 2003    Family History  Problem Relation Age of Onset   Alzheimer's disease Mother    Osteoporosis Mother     No Known Allergies  Current Outpatient Medications on File Prior to Visit  Medication Sig Dispense Refill   albuterol (VENTOLIN HFA) 108 (90 Base) MCG/ACT inhaler INHALE 2 PUFFS BY MOUTH EVERY 4 HOURS AS NEEDED FOR WHEEZE OR FOR SHORTNESS OF BREATH 6.7 each 0  clopidogrel (PLAVIX) 75 MG tablet TAKE 1 TABLET BY MOUTH EVERY DAY 90 tablet 3   diclofenac Sodium (VOLTAREN) 1 % GEL Apply 2 g topically 4 (four) times daily. 50 g 0   empagliflozin (JARDIANCE) 10 MG TABS tablet TAKE 1 TABLET BY MOUTH EVERY MORNING FOR DIABETES 90 tablet 3   fish oil-omega-3 fatty acids 1000 MG capsule Take 1 g by mouth daily.       fluticasone (FLONASE) 50 MCG/ACT nasal spray PLACE 1 SPRAY INTO BOTH NOSTRILS DAILY AS NEEDED FOR ALLERGIES OR RHINITIS. 48 mL 3   furosemide (LASIX) 20 MG tablet TAKE 1 TABLET BY MOUTH EVERY DAY 90 tablet 3   gabapentin (NEURONTIN) 300 MG capsule Take 1 capsule (300 mg total) by mouth 2 (two) times daily. For neuropathy 180 capsule 3   glucose blood (ONE TOUCH ULTRA TEST) test strip USE TO TEST UP TO 4 TIMES DAILY 100 each 2   isosorbide mononitrate (IMDUR) 60 MG 24 hr tablet TAKE 1 TABLET BY MOUTH EVERY DAY 90 tablet 3   lisinopril (ZESTRIL) 2.5 MG tablet TAKE 1 TABLET BY MOUTH EVERY DAY 90 tablet 3   metoprolol succinate (TOPROL-XL) 25 MG 24 hr tablet Take 0.5 tablets (12.5 mg total) by mouth daily. 45 tablet 3   nitroGLYCERIN (NITROSTAT) 0.4 MG SL tablet Place 1 tablet (0.4 mg total) under the tongue every 5 (five) minutes as needed for chest pain. 25 tablet 0    pantoprazole (PROTONIX) 40 MG tablet TAKE 1 TABLET (40 MG TOTAL) BY MOUTH DAILY FOR HEARTBURN. 90 tablet 3   rosuvastatin (CRESTOR) 20 MG tablet TAKE 1 TABLET BY MOUTH EVERY DAY IN THE EVENING FOR CHOLESTEROL 90 tablet 0   sitaGLIPtin (JANUVIA) 100 MG tablet TAKE 1 TABLET BY MOUTH ONCE DAILY FOR DIABETES 90 tablet 1   SYMBICORT 160-4.5 MCG/ACT inhaler INHALE 2 PUFFS INTO THE LUNGS TWICE A DAY 10.2 each 3   tamsulosin (FLOMAX) 0.4 MG CAPS capsule TAKE 1 CAPSULE (0.4 MG TOTAL) BY MOUTH 2 (TWO) TIMES DAILY. 180 capsule 3   warfarin (COUMADIN) 5 MG tablet TAKE 1/2 TABLET DAILY EXCEPT TAKE 1 TABLET ON MONDAYS, THURSDAYS & Saturdays OR AS DIRECTED BY COUMADIN CLINIC 90 tablet 0   No current facility-administered medications on file prior to visit.    BP 110/62   Pulse 98   Temp 98.6 F (37 C) (Temporal)   Ht '5\' 11"'$  (1.803 m)   Wt 228 lb (103.4 kg)   SpO2 98%   BMI 31.80 kg/m  Objective:   Physical Exam Cardiovascular:     Rate and Rhythm: Normal rate and regular rhythm.  Pulmonary:     Effort: Pulmonary effort is normal.  Musculoskeletal:     Cervical back: Neck supple.  Skin:    General: Skin is warm and dry.  Neurological:     Mental Status: He is alert and oriented to person, place, and time.          Assessment & Plan:      This visit occurred during the SARS-CoV-2 public health emergency.  Safety protocols were in place, including screening questions prior to the visit, additional usage of staff PPE, and extensive cleaning of exam room while observing appropriate contact time as indicated for disinfecting solutions.

## 2020-12-30 NOTE — Assessment & Plan Note (Signed)
Deteriorated.   Failed Lexapro, Prozac, bupropion, and now Celexa.  Will switch to SNRI. Would avoid mirtazapine as he's sleeping well and to prevent weight gain given his CAD and diabetes.   Rx for Cymbalta 30 mg sent to pharmacy.  Reduce citalopram to 20 mg x 1 week then stop. Start duloxetine 30 mg daily with reduction of citalopram.   Discussed instructions in depth for at least 10 minutes  Follow up in 6 weeks.

## 2021-01-06 ENCOUNTER — Inpatient Hospital Stay: Payer: Medicare Other | Attending: Radiation Oncology

## 2021-01-06 DIAGNOSIS — Z8546 Personal history of malignant neoplasm of prostate: Secondary | ICD-10-CM | POA: Diagnosis not present

## 2021-01-06 DIAGNOSIS — C61 Malignant neoplasm of prostate: Secondary | ICD-10-CM

## 2021-01-06 LAB — PSA: Prostatic Specific Antigen: 0.02 ng/mL (ref 0.00–4.00)

## 2021-01-12 ENCOUNTER — Other Ambulatory Visit: Payer: Self-pay | Admitting: Primary Care

## 2021-01-12 ENCOUNTER — Other Ambulatory Visit: Payer: Self-pay | Admitting: Radiation Oncology

## 2021-01-12 DIAGNOSIS — F339 Major depressive disorder, recurrent, unspecified: Secondary | ICD-10-CM

## 2021-01-12 DIAGNOSIS — J439 Emphysema, unspecified: Secondary | ICD-10-CM

## 2021-01-13 ENCOUNTER — Ambulatory Visit
Admission: RE | Admit: 2021-01-13 | Discharge: 2021-01-13 | Disposition: A | Discharge status: Home / Self Care | Payer: Medicare Other | Source: Ambulatory Visit | Attending: Radiation Oncology | Admitting: Radiation Oncology

## 2021-01-13 ENCOUNTER — Encounter: Payer: Self-pay | Admitting: Radiation Oncology

## 2021-01-13 VITALS — BP 108/76 | HR 84 | Temp 97.3°F | Resp 18 | Wt 228.2 lb

## 2021-01-13 DIAGNOSIS — C61 Malignant neoplasm of prostate: Secondary | ICD-10-CM

## 2021-01-13 DIAGNOSIS — Z08 Encounter for follow-up examination after completed treatment for malignant neoplasm: Secondary | ICD-10-CM | POA: Diagnosis not present

## 2021-01-13 NOTE — Progress Notes (Signed)
Radiation Oncology Follow up Note  Name: [name] Cook   Date:   01/13/2021 MRN:  [id number] DOB: [birth year]    This 70 y.o. male presents to the clinic today for 2 and half year follow-up status post IMRT radiation therapy to his prostate for Gleason 7 (3+4) adenocarcinoma presenting with a PSA of 6.1.  REFERRING PROVIDER: [name], NP  HPI: Patient is a 70 year old male now about 2-1/2 years having completed IMRT radiation therapy to his prostate for Gleason 7 adenocarcinoma seen today in routine follow-up he is doing well specifically denies any increased lower urinary tract symptoms diarrhea or fatigue he is currently on Flomax twice a day.  His most recent PSA.  Had a slight uptick to 0.02 from 0.01 over the past several years.  COMPLICATIONS OF TREATMENT: none  FOLLOW UP COMPLIANCE: keeps appointments   PHYSICAL EXAM:  BP 108/76 (BP Location: Right Arm, Patient Position: Sitting, Cuff Size: Normal)   Pulse 84   Temp (!) 97.3 F (36.3 C) (Tympanic)   Resp 18   Wt 228 lb 3.2 oz (103.5 kg)   SpO2 99%   BMI 31.83 kg/m  Well-developed well-nourished patient in NAD. HEENT reveals PERLA, EOMI, discs not visualized.  Oral cavity is clear. No oral mucosal lesions are identified. Neck is clear without evidence of cervical or supraclavicular adenopathy. Lungs are clear to A&P. Cardiac examination is essentially unremarkable with regular rate and rhythm without murmur rub or thrill. Abdomen is benign with no organomegaly or masses noted. Motor sensory and DTR levels are equal and symmetric in the upper and lower extremities. Cranial nerves II through XII are grossly intact. Proprioception is intact. No peripheral adenopathy or edema is identified. No motor or sensory levels are noted. Crude visual fields are within normal range.  RADIOLOGY RESULTS: No current films for review  PLAN: Present time patient remains under excellent biochemical control of his prostate cancer.  Based on the  slight uptake I will see him back in 6 months with a repeat PSA.  Patient knows to call with any concerns.  I would like to take this opportunity to thank you for allowing me to participate in the care of your patient.Noreene Filbert, MD

## 2021-01-14 DIAGNOSIS — E119 Type 2 diabetes mellitus without complications: Secondary | ICD-10-CM

## 2021-01-14 DIAGNOSIS — F339 Major depressive disorder, recurrent, unspecified: Secondary | ICD-10-CM

## 2021-01-16 ENCOUNTER — Other Ambulatory Visit: Payer: Self-pay | Admitting: Primary Care

## 2021-01-16 DIAGNOSIS — E119 Type 2 diabetes mellitus without complications: Secondary | ICD-10-CM

## 2021-01-20 NOTE — Patient Instructions (Signed)
Dear [name],  Below is a summary of the goals we discussed during our follow up appointment on December 21, 2020. Please contact me anytime with questions or concerns.   Visit Information  Patient Care Plan: CCM Pharmacy Care Plan     Problem Identified: Disease Management   Priority: High  Onset Date: 12/07/2020  Note:   Current Barriers:  Depressed mood   Pharmacist Clinical Goal(s):  Patient will contact provider office for questions/concerns as evidenced notation of same in electronic health record through collaboration with PharmD and provider.   Interventions: 1:1 collaboration with [name], NP regarding development and update of comprehensive plan of care as evidenced by provider attestation and co-signature Inter-disciplinary care team collaboration (see longitudinal plan of care) Comprehensive medication review performed; medication list updated in electronic medical record  Diabetes (A1c goal <7%) -Unable to assess - A1c 7.6% 06/22 - metformin increased after last A1c elevation. He only reports 1 reading today and it was elevated after lunch. Diarrhea mostly resolved off stool softener. No cost concerns. -Current medications: Januvia 100 mg - 1 tablet daly Jardiance 10 mg - 1 tablet daily Metformin 500 mg - 2 tablet in morning and 1 evening  (dose increased 10/09/20) -Medications previously tried: none reported  -Current home glucose readings - checks fasting 3 days per week fasting glucose: none  Post prandial: 220 after lunch yesterday -Denies hypoglycemic/hyperglycemic symptoms -Recommend continue current medications; Keep record of daily glucose readings.  COPD/OSA (Goal: control symptoms and prevent exacerbations) -Not ideally controlled - per patient report, symptoms could improve. -Pt referred to pulm 6/19 for sleep study. Unclear if patient followed up on referral. -Per chart, he was unable to tolerate CPAP and declines further treatment. -Per  cardiology note, pt has CHF (combined) as well, some symptoms may overlap. He is on Lasix. -No active tobacco use. Quit 2013. -No PFTs found. No CAT score documented. -Current treatment  Symbicort 160-4.5 mcg/act - 2 puff BID (takes PRN) Albuterol PRN  -Medications previously tried: none reported -Exacerbations requiring treatment in last 6 months: none -Patient denies consistent use of maintenance inhaler. He uses Symbicort PRN -Frequency of rescue inhaler use: ~ 1-2 x/week -Counseled on Differences between maintenance and rescue inhalers -Recommended to continue current medication for now. Encouraged to use Symbicort daily if symptoms not well controlled.  Depression/Insomnia (Goal: Improve mood, sleep) -Not ideally controlled - per patient report -Reports easily aggravated and does not feel citalopram works as well as before. -Sleeps from about 10 PM to 5 AM most nights without interruption. Generally feels rested in morning. -Caffeine: 2 cups coffee, drinks coke zero - 2 cans/day. No alcohol or tobacco use -Current treatment: Citalopram 40 mg - 1 tablet daily  -Medications previously tried/failed: Lexapro 10 mg  and Bupropion 150 mg BID --> switched to citalopram 09/2019, increased citalopram to 40 mg 08/21 -PHQ9: 0 (today) -GAD7: 6 (today) -Consulted PCP - she would like to schedule visit with patient to discuss.  Patient Goals/Self-Care Activities Patient will:  - take medications as prescribed check glucose 3 days per week, document, and provide at future appointments check blood pressure 3 days per week, document, and provide at future appointments  Follow Up Plan: Telephone follow up appointment with care management team member scheduled for:  -Pt will schedule PCP visit to discuss depression -CCM visit 6 months       Patient verbalizes understanding of instructions provided today and agrees to view in [place].   Debbora Dus, PharmD  Clinical Pharmacist [place]  [facility] [phone]

## 2021-01-21 ENCOUNTER — Ambulatory Visit (INDEPENDENT_AMBULATORY_CARE_PROVIDER_SITE_OTHER): Payer: Medicare Other

## 2021-01-21 ENCOUNTER — Ambulatory Visit: Payer: Medicare Other

## 2021-01-21 ENCOUNTER — Other Ambulatory Visit: Payer: Self-pay

## 2021-01-21 DIAGNOSIS — Z7901 Long term (current) use of anticoagulants: Secondary | ICD-10-CM

## 2021-01-21 DIAGNOSIS — I4819 Other persistent atrial fibrillation: Secondary | ICD-10-CM

## 2021-01-21 LAB — POCT INR: INR: 3.6 — AB (ref 2.0–3.0)

## 2021-01-21 NOTE — Patient Instructions (Addendum)
Pre visit review using our clinic review tool, if applicable. No additional management support is needed unless otherwise documented below in the visit note.  Hold dose tomorrow and then change weekly dose to 2.5mg  daily except take 5mg  on Mondays and Thursdays. [name] Cook in 4 wks at [facility], [address]

## 2021-01-23 NOTE — Progress Notes (Signed)
Agree. Thanks

## 2021-01-26 ENCOUNTER — Ambulatory Visit: Payer: Medicare Other | Admitting: Primary Care

## 2021-01-31 ENCOUNTER — Other Ambulatory Visit: Payer: Self-pay

## 2021-01-31 ENCOUNTER — Other Ambulatory Visit (INDEPENDENT_AMBULATORY_CARE_PROVIDER_SITE_OTHER): Payer: Medicare Other

## 2021-01-31 DIAGNOSIS — E119 Type 2 diabetes mellitus without complications: Secondary | ICD-10-CM

## 2021-01-31 LAB — POCT GLYCOSYLATED HEMOGLOBIN (HGB A1C): Hemoglobin A1C: 6.5 % — AB (ref 4.0–5.6)

## 2021-02-01 ENCOUNTER — Telehealth: Payer: Self-pay | Admitting: Primary Care

## 2021-02-01 NOTE — Telephone Encounter (Signed)
Pt called in stating that he wants to discuss other options with provider because he has diarrhea and has taken two antibiotics and its not going away

## 2021-02-01 NOTE — Telephone Encounter (Signed)
Called made appointment for evaluation tomorrow. Will call office or go to ED/Walk in if changes in symptoms.

## 2021-02-02 ENCOUNTER — Encounter: Payer: Self-pay | Admitting: Primary Care

## 2021-02-02 ENCOUNTER — Other Ambulatory Visit: Payer: Self-pay

## 2021-02-02 ENCOUNTER — Ambulatory Visit (INDEPENDENT_AMBULATORY_CARE_PROVIDER_SITE_OTHER): Payer: Medicare Other | Admitting: Primary Care

## 2021-02-02 VITALS — BP 110/62 | HR 92 | Temp 97.6°F | Ht 71.0 in | Wt 224.0 lb

## 2021-02-02 DIAGNOSIS — E119 Type 2 diabetes mellitus without complications: Secondary | ICD-10-CM

## 2021-02-02 DIAGNOSIS — Z23 Encounter for immunization: Secondary | ICD-10-CM

## 2021-02-02 DIAGNOSIS — Z8719 Personal history of other diseases of the digestive system: Secondary | ICD-10-CM | POA: Diagnosis not present

## 2021-02-02 DIAGNOSIS — R197 Diarrhea, unspecified: Secondary | ICD-10-CM

## 2021-02-02 DIAGNOSIS — F339 Major depressive disorder, recurrent, unspecified: Secondary | ICD-10-CM

## 2021-02-02 MED ORDER — METFORMIN HCL ER 500 MG PO TB24: 500.0000 mg | ORAL_TABLET | Freq: Two times a day (BID) | ORAL | 1 refills | Status: DC

## 2021-02-02 NOTE — Assessment & Plan Note (Addendum)
Chronic, improved but continued. Likely secondary to metformin.   Decrease dose of Metformin XR to 500 mg twice daily.  Discussed with the patient to decrease caffeine intake and increase water intake.  He will follow up in 2 weeks by phone.  If diarrhea persists then consider CT for evaluation of diverticular disease. Low suspicion of acute diverticulitis today.

## 2021-02-02 NOTE — Assessment & Plan Note (Addendum)
Improved, A1C on 10/17 6.5, down from 7.6.  Foot exam today.  Despite improvement, Metformin ER dose decreased today to 500 mg twice daily related to persistent diarrhea that could be related to increased dose since 12/2020.   Will have patient update [place] in 2 weeks. If diarrhea persists then consider CT for evaluation of diverticular disease. Low suspicion of acute diverticulitis today.  I evaluated patient, was consulted regarding treatment, and agree with assessment and plan per Budd Palmer, RN, DNP student.   Allie Bossier, NP-C

## 2021-02-02 NOTE — Progress Notes (Signed)
Subjective:    Patient ID: [name] Cook, male    DOB: [birth year], 70 y.o.   MRN: [id number]  HPI  TAMIR WALLMAN is a very pleasant 70 y.o. male with a history of atrial fibrillation, hypertension, atrial flutter, diverticulitis, type 2 diabetes, COPD, OSA, prostate cancer, CKD who presents today for follow up of diarrhea and depression.  1) Diarrhea:   He was last evaluated on 12/30/20 for diabetes follow up. During a conversation with the pharmacist he endorsed bothersome diarrhea several times weekly, agreed that day. His metformin regimen was changed from regular to XR, dose increased to XR 1000 mg twice daily.  Today he endorses improved diarrhea since his last visit, two loose stools daily. He does notice intermittent left lower abdominal pain, only with palpation and diarrhea. Denies fevers, chills, bloody stools. He typically drinks 2-3 cups of coffee daily, some water intake. He monitors BP at home which runs 110's/70's.   Diagnosed and treated for acute diverticulitis in early August 2022, no imaging obtained. He endorsed some improvement but no resolve despite two rounds of antibiotics. He is compliant to his metformin XR 1000 mg BID.   2) Depression: He was last evaluated on 12/30/20 for symptoms of depression despite management with citalopram 40 mg that was discussed with the pharmacist. During our visit last month he endorsed feeling more down and sad. Regimen was changed from citalopram 40 mg to Cymbalta 30 mg.  Since his last visit he's feeling much better. Positive improvements include increased energy, feeling less sad/down.   PHQ9 SCORE ONLY 02/02/2021 12/30/2020 12/21/2020  PHQ-9 Total Score 0 8 0     BP Readings from Last 3 Encounters:  02/02/21 110/62  01/13/21 108/76  12/30/20 110/62      Review of Systems  Constitutional:  Negative for fever.  Gastrointestinal:  Positive for abdominal pain and diarrhea. Negative for blood in stool, constipation, nausea and  vomiting.        Past Medical History:  Diagnosis Date   Arthritis    "knees and lower back" (03/14/2013)   Atrial flutter ([place])    radiofrequency ablation in 2001   CAD (coronary artery disease)    a. Nonobstructive. Cardiac cath in 2001-50% mid RI, normal LM, LAD, RCA b. cath 10/16/2014 95% mid RCA treated with DES, 99% ost D1 medical management due to small aneurysmal segment   Chronic anticoagulation    chronic Coumadin anticoagulation   Chronic obstructive pulmonary disease ([place]) 04/20/2011   Diabetes mellitus, type 2 (HCC)    GERD (gastroesophageal reflux disease)    Hyperlipidemia    Hypertension    with hypertensive heart disease   Left knee pain 10/25/2017   medial   Obesity    Persistent atrial fibrillation ([place])    recurrent atrial flutter since 2001 s/p DCCVs, multiple failed AADs, h/o tachy-mediated cardiomyopathy   Shortness of breath    "can come on at any time" (03/14/2013)   Sleep apnea    "dx'd; couldn't wear the mask" (03/14/2013)   Tobacco abuse     Social History   Socioeconomic History   Marital status: Widowed    Spouse name: Not on file   Number of children: 1   Years of education: Not on file   Highest education level: Not on file  Occupational History   Occupation: Unemployed    Employer: UNEMPLOYED  Tobacco Use   Smoking status: Former    Packs/day: 1.00    Years: 42.00  Pack years: 42.00    Types: Cigarettes    Quit date: 12/31/2011    Years since quitting: 9.0   Smokeless tobacco: Never  Vaping Use   Vaping Use: Never used  Substance and Sexual Activity   Alcohol use: Not Currently    Comment: 03/14/2013 "stopped drinking back in 2002; never had problem w/it"   Drug use: No   Sexual activity: Not Currently  Other Topics Concern   Not on file  Social History Narrative   Single.   Retired.    1 son, deceased.    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   Enjoys playing on the computer.        Social Determinants of Health   Financial Resource Strain: Low Risk    Difficulty of Paying Living Expenses: Not very hard  Food Insecurity: No Food Insecurity   Worried About Charity fundraiser in the Last Year: Never true   Ran Out of Food in the Last Year: Never true  Transportation Needs: No Transportation Needs   Lack of Transportation (Medical): No   Lack of Transportation (Non-Medical): No  Physical Activity: Inactive   Days of Exercise per Week: 0 days   Minutes of Exercise per Session: 0 min  Stress: No Stress Concern Present   Feeling of Stress : Not at all  Social Connections: Not on file  Intimate Partner Violence: Not At Risk   Fear of Current or Ex-Partner: No   Emotionally Abused: No   Physically Abused: No   Sexually Abused: No    Past Surgical History:  Procedure Laterality Date   ATRIAL FLUTTER ABLATION  2002   atrial flutter; subsequently developed atrial fibrillation   AV NODE ABLATION  01/24/2013   CARDIAC CATHETERIZATION  2002   CARDIAC CATHETERIZATION N/A 10/16/2014   Procedure: Left Heart Cath and Coronary Angiography;  Surgeon: [name], MD;  Location: [place] [facility];  Service: Cardiovascular;  Laterality: N/A;   CARDIOVERSION  05/31/2011   Procedure: CARDIOVERSION;  Surgeon: Cristopher Estimable. Lattie Haw, MD;  Location: [facility];  Service: Cardiovascular;  Laterality: N/A;   CARPAL TUNNEL RELEASE Left 1980's   COLONOSCOPY WITH PROPOFOL N/A 12/30/2018   Procedure: COLONOSCOPY WITH PROPOFOL;  Surgeon: Jonathon Bellows, MD;  Location: [facility] END[facility]COPY;  Service: Gastroenterology;  Laterality: N/A;   COLONOSCOPY WITH PROPOFOL N/A 12/04/2019   Procedure: COLONOSCOPY WITH PROPOFOL;  Surgeon: Jonathon Bellows, MD;  Location: [facility] ENDOSCOPY;  Service: Gastroenterology;  Laterality: N/A;   INSERT / REPLACE / REMOVE PACEMAKER  01/24/2013    Medtronic Adapta L dual-chamber pacemaker, serial number [id number]    LEFT HEART CATH AND CORONARY ANGIOGRAPHY N/A 06/07/2018    Procedure: LEFT HEART CATH AND CORONARY ANGIOGRAPHY;  Surgeon: [name], MD;  Location: [place] [facility];  Service: Cardiovascular;  Laterality: N/A;   LEFT HEART CATHETERIZATION WITH CORONARY ANGIOGRAM N/A 03/17/2013   Procedure: LEFT HEART CATHETERIZATION WITH CORONARY ANGIOGRAM;  Surgeon: Burnell Blanks, MD; LAD mild dz, D1 branch 100%, inferior branch 99%, CFX OK, RCA 50%, EF 65%     LOOP RECORDER IMPLANT  2002   PERMANENT PACEMAKER INSERTION N/A 01/24/2013   Procedure: PERMANENT PACEMAKER INSERTION;  Surgeon: [name], MD;  [facility] LAB;  Service: Cardiovascular;  Laterality: N/A;   TIBIAL TUBERCLERPLASTY  ~ 2003    Family History  Problem Relation Age of Onset   Alzheimer's disease Mother    Osteoporosis Mother  No Known Allergies  Current Outpatient Medications on File Prior to Visit  Medication Sig Dispense Refill   albuterol (VENTOLIN HFA) 108 (90 Base) MCG/ACT inhaler INHALE 2 PUFFS BY MOUTH EVERY 4 HOURS AS NEEDED FOR WHEEZE OR FOR SHORTNESS OF BREATH 6.7 each 0   budesonide-formoterol (SYMBICORT) 160-4.5 MCG/ACT inhaler INHALE 2 PUFFS INTO THE LUNGS TWICE A DAY 10.2 each 5   clopidogrel (PLAVIX) 75 MG tablet TAKE 1 TABLET BY MOUTH EVERY DAY 90 tablet 3   diclofenac Sodium (VOLTAREN) 1 % GEL Apply 2 g topically 4 (four) times daily. 50 g 0   DULoxetine (CYMBALTA) 30 MG capsule Take 1 capsule (30 mg total) by mouth daily. For depression. 90 capsule 0   empagliflozin (JARDIANCE) 10 MG TABS tablet TAKE 1 TABLET BY MOUTH EVERY MORNING FOR DIABETES 90 tablet 3   fish oil-omega-3 fatty acids 1000 MG capsule Take 1 g by mouth daily.       fluticasone (FLONASE) 50 MCG/ACT nasal spray PLACE 1 SPRAY INTO BOTH NOSTRILS DAILY AS NEEDED FOR ALLERGIES OR RHINITIS. 48 mL 3   furosemide (LASIX) 20 MG tablet TAKE 1 TABLET BY MOUTH EVERY DAY 90 tablet 3   gabapentin (NEURONTIN) 300 MG capsule Take 1 capsule (300 mg total) by mouth 2 (two) times daily. For  neuropathy 180 capsule 3   glucose blood (ONE TOUCH ULTRA TEST) test strip USE TO TEST UP TO 4 TIMES DAILY 100 each 2   isosorbide mononitrate (IMDUR) 60 MG 24 hr tablet TAKE 1 TABLET BY MOUTH EVERY DAY 90 tablet 3   lisinopril (ZESTRIL) 2.5 MG tablet TAKE 1 TABLET BY MOUTH EVERY DAY 90 tablet 3   metFORMIN (GLUCOPHAGE-XR) 500 MG 24 hr tablet Take 2 tablets (1,000 mg total) by mouth 2 (two) times daily with a meal. For diabetes. 360 tablet 1   metoprolol succinate (TOPROL-XL) 25 MG 24 hr tablet Take 0.5 tablets (12.5 mg total) by mouth daily. 45 tablet 3   nitroGLYCERIN (NITROSTAT) 0.4 MG SL tablet Place 1 tablet (0.4 mg total) under the tongue every 5 (five) minutes as needed for chest pain. 25 tablet 0   pantoprazole (PROTONIX) 40 MG tablet TAKE 1 TABLET (40 MG TOTAL) BY MOUTH DAILY FOR HEARTBURN. 90 tablet 3   rosuvastatin (CRESTOR) 20 MG tablet TAKE 1 TABLET BY MOUTH EVERY DAY IN THE EVENING FOR CHOLESTEROL 90 tablet 0   sitaGLIPtin (JANUVIA) 100 MG tablet TAKE 1 TABLET BY MOUTH ONCE DAILY FOR DIABETES 90 tablet 1   tamsulosin (FLOMAX) 0.4 MG CAPS capsule TAKE 1 CAPSULE (0.4 MG TOTAL) BY MOUTH 2 (TWO) TIMES DAILY. 180 capsule 3   warfarin (COUMADIN) 5 MG tablet TAKE 1/2 TABLET DAILY EXCEPT TAKE 1 TABLET ON MONDAYS, THURSDAYS & Saturdays OR AS DIRECTED BY COUMADIN CLINIC 90 tablet 0   No current facility-administered medications on file prior to visit.    BP 110/62   Pulse 92   Temp 97.6 F (36.4 C) (Temporal)   Ht 5\' 11"  (1.803 m)   Wt 224 lb (101.6 kg)   SpO2 96%   BMI 31.24 kg/m  Objective:   Physical Exam Cardiovascular:     Rate and Rhythm: Normal rate and regular rhythm.  Pulmonary:     Effort: Pulmonary effort is normal.     Breath sounds: Normal breath sounds. No wheezing or rales.  Abdominal:     General: Bowel sounds are normal.     Palpations: Abdomen is soft.     Tenderness: There is  abdominal tenderness in the left lower quadrant.     Comments: Mild tenderness upon  deep palpation to LLQ  Musculoskeletal:     Cervical back: Neck supple.  Skin:    General: Skin is warm and dry.  Neurological:     Mental Status: He is alert and oriented to person, place, and time.          Assessment & Plan:      This visit occurred during the SARS-CoV-2 public health emergency.  Safety protocols were in place, including screening questions prior to the visit, additional usage of staff PPE, and extensive cleaning of exam room while observing appropriate contact time as indicated for disinfecting solutions.

## 2021-02-02 NOTE — Assessment & Plan Note (Signed)
Diarrhea improved since reduction of metformin, will further reduce. Do not suspect diarrhea is secondary to diverticular disease.   Will have patient update [place] in 2 weeks. If diarrhea persists then consider CT for evaluation of diverticular disease. Low suspicion of acute diverticulitis today.

## 2021-02-02 NOTE — Progress Notes (Signed)
Established Patient Office Visit  Subjective:  Patient ID: [name] Cook, male    DOB: 1950/09/07  Age: 70 y.o. MRN: [id number]  CC: No chief complaint on file.   HPI [name] Cook is a 70 year old male with a past medical history of  Afib and A flutter, HTN, MI, CAD, pacemaker, COPD, OSA, DM2, CKDII, and hyperlipidemia presents for follow up on diarrhea and depression.   Diarrhea: Onset was approximately 2 months ago. He reports that his symptoms, now having 2 episodes of loose stools per day. At its worst, he was having more than 5 episodes of loose stools per day. He reports no changes in his diet. He has not tried anything over the counter. He drinks 3-4 glasses per day, 3 cups of coffee per day which is a normal amount for him. He has not had any nausea or vomiting, and no bloody stools.  He has intermittent dizziness approximately once daily when he abruptly stands from sitting position. He takes his BP 3 times per week, this morning 117/72 today.   His Metformin was recently changed to 1,000 mg XR twice daily on 12/30/20 for elevated A1C.  He was treated for acute diverticulitis in August 2022 by GI with Augmentin. No imaging was obtained at this time. He has had persistent tenderness to the left lower quadrant with palpation since this visit.   Depression: He is doing well on Cymbalta, PHQ9 score of 0 today. He is very happy with this medication and how it has affected his mood and energy level. [name] Cook Visit from 02/02/2021 in [facility]  PHQ-9 Total Score 0         Past Medical History:  Diagnosis Date   Arthritis    "knees and lower back" (03/14/2013)   Atrial flutter ([place])    radiofrequency ablation in 2001   CAD (coronary artery disease)    a. Nonobstructive. Cardiac cath in 2001-50% mid RI, normal LM, LAD, RCA b. cath 10/16/2014 95% mid RCA treated with DES, 99% ost D1 medical management due to small aneurysmal segment   Chronic  anticoagulation    chronic Coumadin anticoagulation   Chronic obstructive pulmonary disease ([employer]) 04/20/2011   Diabetes mellitus, type 2 (HCC)    GERD (gastroesophageal reflux disease)    Hyperlipidemia    Hypertension    with hypertensive heart disease   Left knee pain 10/25/2017   medial   Obesity    Persistent atrial fibrillation ([place])    recurrent atrial flutter since 2001 s/p DCCVs, multiple failed AADs, h/o tachy-mediated cardiomyopathy   Shortness of breath    "can come on at any time" (03/14/2013)   Sleep apnea    "dx'd; couldn't wear the mask" (03/14/2013)   Tobacco abuse     Past Surgical History:  Procedure Laterality Date   ATRIAL FLUTTER ABLATION  2002   atrial flutter; subsequently developed atrial fibrillation   AV NODE ABLATION  01/24/2013   CARDIAC CATHETERIZATION  2002   CARDIAC CATHETERIZATION N/A 10/16/2014   Procedure: Left Heart Cath and Coronary Angiography;  Surgeon: [name], MD;  Location: [place] [facility];  Service: Cardiovascular;  Laterality: N/A;   CARDIOVERSION  05/31/2011   Procedure: CARDIOVERSION;  Surgeon: Cristopher Estimable. Lattie Haw, MD;  Location: [facility];  Service: Cardiovascular;  Laterality: N/A;   CARPAL TUNNEL RELEASE Left 1980's   COLONOSCOPY WITH PROPOFOL N/A 12/30/2018   Procedure: COLONOSCOPY WITH PROPOFOL;  Surgeon: Jonathon Bellows, MD;  Location: [facility] ENDOSCOPY;  Service: Gastroenterology;  Laterality: N/A;   COLONOSCOPY WITH PROPOFOL N/A 12/04/2019   Procedure: COLONOSCOPY WITH PROPOFOL;  Surgeon: Jonathon Bellows, MD;  Location: [facility] ENDOSCOPY;  Service: Gastroenterology;  Laterality: N/A;   INSERT / REPLACE / REMOVE PACEMAKER  01/24/2013    Medtronic Adapta L dual-chamber pacemaker, serial number [id number]    LEFT HEART CATH AND CORONARY ANGIOGRAPHY N/A 06/07/2018   Procedure: LEFT HEART CATH AND CORONARY ANGIOGRAPHY;  Surgeon: [name], MD;  Location: [place] [facility];  Service: Cardiovascular;  Laterality: N/A;   LEFT HEART  CATHETERIZATION WITH CORONARY ANGIOGRAM N/A 03/17/2013   Procedure: LEFT HEART CATHETERIZATION WITH CORONARY ANGIOGRAM;  Surgeon: Burnell Blanks, MD; LAD mild dz, D1 branch 100%, inferior branch 99%, CFX OK, RCA 50%, EF 65%     LOOP RECORDER IMPLANT  2002   PERMANENT PACEMAKER INSERTION N/A 01/24/2013   Procedure: PERMANENT PACEMAKER INSERTION;  Surgeon: [name], MD;  Location: [facility] CATH LAB;  Service: Cardiovascular;  Laterality: N/A;   TIBIAL TUBERCLERPLASTY  ~ 2003    Family History  Problem Relation Age of Onset   Alzheimer's disease Mother    Osteoporosis Mother     Social History   Socioeconomic History   Marital status: Widowed    Spouse name: Not on file   Number of children: 1   Years of education: Not on file   Highest education level: Not on file  Occupational History   Occupation: Unemployed    Employer: UNEMPLOYED  Tobacco Use   Smoking status: Former    Packs/day: 1.00    Years: 42.00    Pack years: 42.00    Types: Cigarettes    Quit date: 12/31/2011    Years since quitting: 9.0   Smokeless tobacco: Never  Vaping Use   Vaping Use: Never used  Substance and Sexual Activity   Alcohol use: Not Currently    Comment: 03/14/2013 "stopped drinking back in 2002; never had problem w/it"   Drug use: No   Sexual activity: Not Currently  Other Topics Concern   Not on file  Social History Narrative   Single.   Retired.    1 son, deceased.    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   Enjoys playing on the computer.       Social Determinants of Health   Financial Resource Strain: Low Risk    Difficulty of Paying Living Expenses: Not very hard  Food Insecurity: No Food Insecurity   Worried About Charity fundraiser in the Last Year: Never true   Ran Out of Food in the Last Year: Never true  Transportation Needs: No Transportation Needs   Lack of Transportation (Medical): No   Lack of Transportation  (Non-Medical): No  Physical Activity: Inactive   Days of Exercise per Week: 0 days   Minutes of Exercise per Session: 0 min  Stress: No Stress Concern Present   Feeling of Stress : Not at all  Social Connections: Not on file  Intimate Partner Violence: Not At Risk   Fear of Current or Ex-Partner: No   Emotionally Abused: No   Physically Abused: No   Sexually Abused: No    Outpatient Medications Prior to Visit  Medication Sig Dispense Refill   albuterol (VENTOLIN HFA) 108 (90 Base) MCG/ACT inhaler INHALE 2 PUFFS BY MOUTH EVERY 4 HOURS AS NEEDED FOR WHEEZE OR FOR SHORTNESS OF BREATH 6.7 each 0   budesonide-formoterol (SYMBICORT)  160-4.5 MCG/ACT inhaler INHALE 2 PUFFS INTO THE LUNGS TWICE A DAY 10.2 each 5   clopidogrel (PLAVIX) 75 MG tablet TAKE 1 TABLET BY MOUTH EVERY DAY 90 tablet 3   diclofenac Sodium (VOLTAREN) 1 % GEL Apply 2 g topically 4 (four) times daily. 50 g 0   DULoxetine (CYMBALTA) 30 MG capsule Take 1 capsule (30 mg total) by mouth daily. For depression. 90 capsule 0   empagliflozin (JARDIANCE) 10 MG TABS tablet TAKE 1 TABLET BY MOUTH EVERY MORNING FOR DIABETES 90 tablet 3   fish oil-omega-3 fatty acids 1000 MG capsule Take 1 g by mouth daily.       fluticasone (FLONASE) 50 MCG/ACT nasal spray PLACE 1 SPRAY INTO BOTH NOSTRILS DAILY AS NEEDED FOR ALLERGIES OR RHINITIS. 48 mL 3   furosemide (LASIX) 20 MG tablet TAKE 1 TABLET BY MOUTH EVERY DAY 90 tablet 3   gabapentin (NEURONTIN) 300 MG capsule Take 1 capsule (300 mg total) by mouth 2 (two) times daily. For neuropathy 180 capsule 3   glucose blood (ONE TOUCH ULTRA TEST) test strip USE TO TEST UP TO 4 TIMES DAILY 100 each 2   isosorbide mononitrate (IMDUR) 60 MG 24 hr tablet TAKE 1 TABLET BY MOUTH EVERY DAY 90 tablet 3   lisinopril (ZESTRIL) 2.5 MG tablet TAKE 1 TABLET BY MOUTH EVERY DAY 90 tablet 3   metFORMIN (GLUCOPHAGE-XR) 500 MG 24 hr tablet Take 2 tablets (1,000 mg total) by mouth 2 (two) times daily with a meal. For  diabetes. 360 tablet 1   metoprolol succinate (TOPROL-XL) 25 MG 24 hr tablet Take 0.5 tablets (12.5 mg total) by mouth daily. 45 tablet 3   nitroGLYCERIN (NITROSTAT) 0.4 MG SL tablet Place 1 tablet (0.4 mg total) under the tongue every 5 (five) minutes as needed for chest pain. 25 tablet 0   pantoprazole (PROTONIX) 40 MG tablet TAKE 1 TABLET (40 MG TOTAL) BY MOUTH DAILY FOR HEARTBURN. 90 tablet 3   rosuvastatin (CRESTOR) 20 MG tablet TAKE 1 TABLET BY MOUTH EVERY DAY IN THE EVENING FOR CHOLESTEROL 90 tablet 0   sitaGLIPtin (JANUVIA) 100 MG tablet TAKE 1 TABLET BY MOUTH ONCE DAILY FOR DIABETES 90 tablet 1   tamsulosin (FLOMAX) 0.4 MG CAPS capsule TAKE 1 CAPSULE (0.4 MG TOTAL) BY MOUTH 2 (TWO) TIMES DAILY. 180 capsule 3   warfarin (COUMADIN) 5 MG tablet TAKE 1/2 TABLET DAILY EXCEPT TAKE 1 TABLET ON MONDAYS, THURSDAYS & Saturdays OR AS DIRECTED BY COUMADIN CLINIC 90 tablet 0   No facility-administered medications prior to visit.    No Known Allergies  ROS Review of Systems  Constitutional:  Negative for appetite change and fever.  Respiratory: Negative.    Cardiovascular: Negative.   Gastrointestinal:  Positive for abdominal pain and diarrhea. Negative for blood in stool, nausea and vomiting.       Left lower quadrant pain  Musculoskeletal: Negative.   Psychiatric/Behavioral: Negative.       Objective:    Physical Exam Vitals reviewed.  Constitutional:      Appearance: Normal appearance.  Cardiovascular:     Rate and Rhythm: Normal rate and regular rhythm.     Heart sounds: Normal heart sounds.  Pulmonary:     Effort: Pulmonary effort is normal.     Breath sounds: Normal breath sounds.  Skin:    General: Skin is warm and dry.  Neurological:     Mental Status: He is alert and oriented to person, place, and time.  Psychiatric:  Mood and Affect: Mood normal.        Behavior: Behavior normal.    There were no vitals taken for this visit. Wt Readings from Last 3  Encounters:  01/13/21 228 lb 3.2 oz (103.5 kg)  12/30/20 228 lb (103.4 kg)  11/23/20 228 lb 12.8 oz (103.8 kg)     Health Maintenance Due  Topic Date Due   Pneumonia Vaccine 74+ Years old (71 - PPSV23 or PCV20) 09/22/2018   FOOT EXAM  10/08/2020   TETANUS/TDAP  01/04/2021    There are no preventive care reminders to display for this patient.  Lab Results  Component Value Date   TSH 2.030 04/15/2019   Lab Results  Component Value Date   WBC 4.7 11/16/2020   HGB 14.6 11/16/2020   HCT 43.9 11/16/2020   MCV 97.9 11/16/2020   PLT 118.0 (L) 11/16/2020   Lab Results  Component Value Date   NA 139 11/16/2020   K 4.0 11/16/2020   CO2 25 11/16/2020   GLUCOSE 212 (H) 11/16/2020   BUN 19 11/16/2020   CREATININE 1.11 11/16/2020   BILITOT 0.6 11/16/2020   ALKPHOS 44 11/16/2020   AST 21 11/16/2020   ALT 20 11/16/2020   PROT 6.6 11/16/2020   ALBUMIN 4.2 11/16/2020   CALCIUM 9.8 11/16/2020   ANIONGAP 11 01/23/2020   GFR 68.24 11/16/2020   Lab Results  Component Value Date   CHOL 131 10/07/2020   Lab Results  Component Value Date   HDL 43.90 10/07/2020   Lab Results  Component Value Date   LDLCALC 55 10/07/2020   Lab Results  Component Value Date   TRIG 161.0 (H) 10/07/2020   Lab Results  Component Value Date   CHOLHDL 3 10/07/2020   Lab Results  Component Value Date   HGBA1C 6.5 (A) 01/31/2021      Assessment & Plan:   Problem List Items Addressed This Visit   None   No orders of the defined types were placed in this encounter.   Follow-up: No follow-ups on file.    Ninfa Meeker, RN

## 2021-02-02 NOTE — Assessment & Plan Note (Addendum)
Improved and doing well with 30 mg Cymbalta daily.  PHQ9 score of zero in the office today.  Continue 30 mg Cymbalta daily.  Discussed improvement with patient, he eagerly agrees to continue Cymbalta.  I evaluated patient, was consulted regarding treatment, and agree with assessment and plan per Budd Palmer, RN, DNP student.   Allie Bossier, NP-C

## 2021-02-02 NOTE — Patient Instructions (Addendum)
Start taking 1 tablet of Metformin twice daily.  Cut back on caffeine intake.  Call back within 2 weeks to let us know how your diarrhea is doing.  Great to see you today!

## 2021-02-10 ENCOUNTER — Ambulatory Visit: Payer: Medicare Other | Admitting: Primary Care

## 2021-02-14 NOTE — Progress Notes (Signed)
02/15/2021 11:46 AM   [name] Cook 04-Aug-1950 [id number]  Referring provider: [name], NP [place],  [place] [address]  Chief Complaint  Patient presents with   Follow-up   Urological history: 1. ED -contributing factors of age, CAD, sleep apnea, COPD, history of smoking, HTN, HLD and diabetes - not a candidate for PDE5i's secondary due nitrate use  2. Prostate cancer -PSA 0.02 in 01/06/2021 -cT2 prostate cancer, high-volume intermediate risk involving right-sided gland only -treatment with EBRT and short-term Lupron.  He received his dose in 06/2018.  He completed radiation in 08/2018.  HPI: [name] Cook is a 70 y.o. male who presents today to discuss surgery.  He has been having issues with ED since undergoing radiation for his prostate cancer.   He is on Imdur and not a candidate for PDE 5 inhibitors.  He was seen by Dr. Junious Silk for erectile dysfunction and they discussed different treatment options, but he would like to discuss treatment options that his insurance would likely not cover.    PMH: Past Medical History:  Diagnosis Date   Arthritis    "knees and lower back" (03/14/2013)   Atrial flutter ([place])    radiofrequency ablation in 2001   CAD (coronary artery disease)    a. Nonobstructive. Cardiac cath in 2001-50% mid RI, normal LM, LAD, RCA b. cath 10/16/2014 95% mid RCA treated with DES, 99% ost D1 medical management due to small aneurysmal segment   Chronic anticoagulation    chronic Coumadin anticoagulation   Chronic obstructive pulmonary disease ([place]) 04/20/2011   Diabetes mellitus, type 2 (HCC)    GERD (gastroesophageal reflux disease)    Hyperlipidemia    Hypertension    with hypertensive heart disease   Left knee pain 10/25/2017   medial   Obesity    Persistent atrial fibrillation ([place])    recurrent atrial flutter since 2001 s/p DCCVs, multiple failed AADs, h/o tachy-mediated cardiomyopathy   Shortness of breath    "can come on at  any time" (03/14/2013)   Sleep apnea    "dx'd; couldn't wear the mask" (03/14/2013)   Tobacco abuse     Surgical History: Past Surgical History:  Procedure Laterality Date   ATRIAL FLUTTER ABLATION  2002   atrial flutter; subsequently developed atrial fibrillation   AV NODE ABLATION  01/24/2013   CARDIAC CATHETERIZATION  2002   CARDIAC CATHETERIZATION N/A 10/16/2014   Procedure: Left Heart Cath and Coronary Angiography;  Surgeon: [name], MD;  Location: [facility];  Service: Cardiovascular;  Laterality: N/A;   CARDIOVERSION  05/31/2011   Procedure: CARDIOVERSION;  Surgeon: Cristopher Estimable. Lattie Haw, MD;  Location: [facility];  Service: Cardiovascular;  Laterality: N/A;   CARPAL TUNNEL RELEASE Left 1980's   COLONOSCOPY WITH PROPOFOL N/A 12/30/2018   Procedure: COLONOSCOPY WITH PROPOFOL;  Surgeon: Jonathon Bellows, MD;  Location: [facility] ENDOSCOPY;  Service: Gastroenterology;  Laterality: N/A;   COLONOSCOPY WITH PROPOFOL N/A 12/04/2019   Procedure: COLONOSCOPY WITH PROPOFOL;  Surgeon: Jonathon Bellows, MD;  Location: [facility] ENDOSCOPY;  Service: Gastroenterology;  Laterality: N/A;   INSERT / REPLACE / REMOVE PACEMAKER  01/24/2013    Medtronic Adapta L dual-chamber pacemaker, serial number [id number]    LEFT HEART CATH AND CORONARY ANGIOGRAPHY N/A 06/07/2018   Procedure: LEFT HEART CATH AND CORONARY ANGIOGRAPHY;  Surgeon: [name], MD;  Location: [facility];  Service: Cardiovascular;  Laterality: N/A;   LEFT HEART CATHETERIZATION WITH CORONARY ANGIOGRAM N/A 03/17/2013  Procedure: LEFT HEART CATHETERIZATION WITH CORONARY ANGIOGRAM;  Surgeon: Burnell Blanks, MD; LAD mild dz, D1 branch 100%, inferior branch 99%, CFX OK, RCA 50%, EF 65%     LOOP RECORDER IMPLANT  2002   PERMANENT PACEMAKER INSERTION N/A 01/24/2013   Procedure: PERMANENT PACEMAKER INSERTION;  Surgeon: [name], MD;  Location: [facility] CATH LAB;  Service: Cardiovascular;  Laterality: N/A;   TIBIAL TUBERCLERPLASTY  ~ 2003     Home Medications:  Allergies as of 02/15/2021   No Known Allergies      Medication List        Accurate as of February 15, 2021 11:46 AM. If you have any questions, ask your nurse or doctor.          albuterol 108 (90 Base) MCG/ACT inhaler Commonly known as: VENTOLIN HFA INHALE 2 PUFFS BY MOUTH EVERY 4 HOURS AS NEEDED FOR WHEEZE OR FOR SHORTNESS OF BREATH   clopidogrel 75 MG tablet Commonly known as: PLAVIX TAKE 1 TABLET BY MOUTH EVERY DAY   diclofenac Sodium 1 % Gel Commonly known as: Voltaren Apply 2 g topically 4 (four) times daily.   DULoxetine 30 MG capsule Commonly known as: Cymbalta Take 1 capsule (30 mg total) by mouth daily. For depression.   fish oil-omega-3 fatty acids 1000 MG capsule Take 1 g by mouth daily.   fluticasone 50 MCG/ACT nasal spray Commonly known as: FLONASE PLACE 1 SPRAY INTO BOTH NOSTRILS DAILY AS NEEDED FOR ALLERGIES OR RHINITIS.   furosemide 20 MG tablet Commonly known as: LASIX TAKE 1 TABLET BY MOUTH EVERY DAY   gabapentin 300 MG capsule Commonly known as: NEURONTIN Take 1 capsule (300 mg total) by mouth 2 (two) times daily. For neuropathy   glucose blood test strip Commonly known as: ONE TOUCH ULTRA TEST USE TO TEST UP TO 4 TIMES DAILY   isosorbide mononitrate 60 MG 24 hr tablet Commonly known as: IMDUR TAKE 1 TABLET BY MOUTH EVERY DAY   Jardiance 10 MG Tabs tablet Generic drug: empagliflozin TAKE 1 TABLET BY MOUTH EVERY MORNING FOR DIABETES   lisinopril 2.5 MG tablet Commonly known as: ZESTRIL TAKE 1 TABLET BY MOUTH EVERY DAY   metFORMIN 500 MG 24 hr tablet Commonly known as: GLUCOPHAGE-XR Take 1 tablet (500 mg total) by mouth 2 (two) times daily with a meal. For diabetes.   metoprolol succinate 25 MG 24 hr tablet Commonly known as: TOPROL-XL Take 0.5 tablets (12.5 mg total) by mouth daily.   nitroGLYCERIN 0.4 MG SL tablet Commonly known as: NITROSTAT Place 1 tablet (0.4 mg total) under the tongue every 5  (five) minutes as needed for chest pain.   pantoprazole 40 MG tablet Commonly known as: PROTONIX TAKE 1 TABLET (40 MG TOTAL) BY MOUTH DAILY FOR HEARTBURN.   rosuvastatin 20 MG tablet Commonly known as: CRESTOR TAKE 1 TABLET BY MOUTH EVERY DAY IN THE EVENING FOR CHOLESTEROL   sitaGLIPtin 100 MG tablet Commonly known as: Januvia TAKE 1 TABLET BY MOUTH ONCE DAILY FOR DIABETES   Symbicort 160-4.5 MCG/ACT inhaler Generic drug: budesonide-formoterol INHALE 2 PUFFS INTO THE LUNGS TWICE A DAY   tamsulosin 0.4 MG Caps capsule Commonly known as: FLOMAX TAKE 1 CAPSULE (0.4 MG TOTAL) BY MOUTH 2 (TWO) TIMES DAILY.   warfarin 5 MG tablet Commonly known as: COUMADIN Take as directed by the anticoagulation clinic. If you are unsure how to take this medication, talk to your nurse or doctor. Original instructions: TAKE 1/2 TABLET DAILY EXCEPT TAKE 1 TABLET ON  MONDAYS, THURSDAYS & Saturdays OR AS DIRECTED BY COUMADIN CLINIC        Allergies: No Known Allergies  Family History: Family History  Problem Relation Age of Onset   Alzheimer's disease Mother    Osteoporosis Mother     Social History:  reports that he quit smoking about 9 years ago. His smoking use included cigarettes. He has a 42.00 pack-year smoking history. He has never used smokeless tobacco. He reports that he does not currently use alcohol. He reports that he does not use drugs.  ROS: Pertinent ROS in HPI  Physical Exam: BP 115/65   Pulse 86   Ht 5\' 11"  (1.803 m)   Wt 227 lb 14.4 oz (103.4 kg)   BMI 31.79 kg/m   Constitutional:  Well nourished. Alert and oriented, No acute distress. HEENT: [place] AT, mask in place.  Trachea midline Cardiovascular: No clubbing, cyanosis, or edema. Respiratory: Normal respiratory effort, no increased work of breathing. Neurologic: Grossly intact, no focal deficits, moving all 4 extremities. Psychiatric: Normal mood and affect.  Laboratory Data: Lab Results  Component Value Date    WBC 4.7 11/16/2020   HGB 14.6 11/16/2020   HCT 43.9 11/16/2020   MCV 97.9 11/16/2020   PLT 118.0 (L) 11/16/2020    Lab Results  Component Value Date   CREATININE 1.11 11/16/2020    Lab Results  Component Value Date   PSA 6.10 (H) 09/21/2017   PSA 4.33 (H) 10/27/2015   PSA 4.71 (H) 06/22/2014    Lab Results  Component Value Date   HGBA1C 6.5 (A) 01/31/2021       Component Value Date/Time   CHOL 131 10/07/2020 1018   CHOL 147 05/28/2019 1202   CHOL 125 01/23/2013 0151   HDL 43.90 10/07/2020 1018   HDL 54 05/28/2019 1202   HDL 33 (L) 01/23/2013 0151   CHOLHDL 3 10/07/2020 1018   VLDL 32.2 10/07/2020 1018   VLDL 42 (H) 01/23/2013 0151   LDLCALC 55 10/07/2020 1018   LDLCALC 69 05/28/2019 1202   LDLCALC 56 03/28/2017 1433   LDLCALC 50 01/23/2013 0151   LDLCALC 119 05/05/2008 0000    Lab Results  Component Value Date   AST 21 11/16/2020   Lab Results  Component Value Date   ALT 20 11/16/2020  I have reviewed the labs.   Pertinent Imaging: N/A   Assessment & Plan:    1. ED - We discussed intra-urethral suppositories, intracavernous vasoactive drug injection therapy, vacuum constriction device and penile prosthesis implantation -As he is concerned for cost issues and also has an aversion to needles, he has decided to see if he is a candidate for the penile prosthesis implantation -I have referred him to Dr. Gloriann Loan at [facility] for further consideration  2. Prostate cancer -follow up in March 2023  Return for refer to Dr. Gloriann Loan for [place] .  These notes generated with voice recognition software. I apologize for typographical errors.  Zara Council, PA-C  [facility] [address]  [place],  [address] [phone]   I spent 15 minutes on the day of the encounter to include pre-visit record review, face-to-face time with the patient, and post-visit ordering of tests.

## 2021-02-15 ENCOUNTER — Other Ambulatory Visit: Payer: Self-pay

## 2021-02-15 ENCOUNTER — Encounter: Payer: Self-pay | Admitting: Urology

## 2021-02-15 ENCOUNTER — Ambulatory Visit (INDEPENDENT_AMBULATORY_CARE_PROVIDER_SITE_OTHER): Payer: Medicare Other | Admitting: Urology

## 2021-02-15 VITALS — BP 115/65 | HR 86 | Ht 71.0 in | Wt 227.9 lb

## 2021-02-15 DIAGNOSIS — N529 Male erectile dysfunction, unspecified: Secondary | ICD-10-CM | POA: Diagnosis not present

## 2021-02-15 DIAGNOSIS — C61 Malignant neoplasm of prostate: Secondary | ICD-10-CM | POA: Diagnosis not present

## 2021-02-15 NOTE — Patient Instructions (Signed)
.  buatri

## 2021-02-18 ENCOUNTER — Ambulatory Visit: Payer: Medicare Other

## 2021-02-25 ENCOUNTER — Telehealth: Payer: Self-pay

## 2021-02-25 ENCOUNTER — Ambulatory Visit: Payer: Medicare Other

## 2021-02-25 NOTE — Telephone Encounter (Signed)
Pt NS coumadin clinic apt this morning. LVM to call to RS.

## 2021-02-28 NOTE — Telephone Encounter (Signed)
Pt cannot make apt this Friday so he requested to be seen at another coumadin clinic location.  Scheduled pt for tomorrow at [facility]. Gave pt address and he verbalized understanding.

## 2021-03-01 ENCOUNTER — Ambulatory Visit (INDEPENDENT_AMBULATORY_CARE_PROVIDER_SITE_OTHER): Payer: Medicare Other

## 2021-03-01 ENCOUNTER — Other Ambulatory Visit: Payer: Self-pay

## 2021-03-01 DIAGNOSIS — Z7901 Long term (current) use of anticoagulants: Secondary | ICD-10-CM

## 2021-03-01 LAB — POCT INR: INR: 2.5 (ref 2.0–3.0)

## 2021-03-01 NOTE — Progress Notes (Signed)
Medical treatment/procedure(s) were performed by non-physician practitioner and as supervising physician I was immediately available for consultation/collaboration. I agree with above. Izamar Linden A Etan Vasudevan, MD  

## 2021-03-01 NOTE — Progress Notes (Signed)
Continue 2.5mg  daily except take 5mg  on Mondays and Thursdays. Recheck in 4 wks at [facility], [address]

## 2021-03-01 NOTE — Patient Instructions (Addendum)
Pre visit review using our clinic review tool, if applicable. No additional management support is needed unless otherwise documented below in the visit note.  Continue 2.5mg  daily except take 5mg  on Mondays and Thursdays. Recheck in 4 wks at [facility], [address]

## 2021-03-02 ENCOUNTER — Telehealth (INDEPENDENT_AMBULATORY_CARE_PROVIDER_SITE_OTHER): Payer: Medicare Other | Admitting: Nurse Practitioner

## 2021-03-02 ENCOUNTER — Encounter: Payer: Self-pay | Admitting: Nurse Practitioner

## 2021-03-02 VITALS — BP 155/83 | HR 83 | Temp 100.0°F

## 2021-03-02 DIAGNOSIS — R52 Pain, unspecified: Secondary | ICD-10-CM | POA: Insufficient documentation

## 2021-03-02 DIAGNOSIS — R051 Acute cough: Secondary | ICD-10-CM | POA: Diagnosis not present

## 2021-03-02 DIAGNOSIS — R509 Fever, unspecified: Secondary | ICD-10-CM | POA: Insufficient documentation

## 2021-03-02 MED ORDER — GUAIFENESIN-CODEINE 100-10 MG/5ML PO SOLN: 5.0000 mL | Freq: Three times a day (TID) | ORAL | 0 refills | Status: DC | PRN

## 2021-03-02 NOTE — Progress Notes (Signed)
Patient ID: [name] Cook, male    DOB: [birth year], 70 y.o.   MRN: [id number]  Virtual visit completed through [employer], a video enabled telemedicine application. Due to national recommendations of social distancing due to COVID-19, a virtual visit is felt to be most appropriate for this patient at this time. Reviewed limitations, risks, security and privacy concerns of performing a virtual visit and the availability of in person appointments. I also reviewed that there may be a patient responsible charge related to this service. The patient agreed to proceed.   Attempted to connect via video enabled device but was unsuccessful. Revert to telephone encounter  Patient location: home Provider location: [place] [facility], office Persons participating in this virtual visit: patient, provider   If any vitals were documented, they were collected by patient at home unless specified below.    BP (!) 155/83 Comment: vitals per patient  Pulse 83   Temp 100 F (37.8 C)    CC: Cough and body aches Subjective:   HPI: [name] Cook is a 70 y.o. male presenting on 03/02/2021 for Cough (Sx started about a week ago but started to feel worse the past 2 days, body aches, sneezing. No sore throat, fever, SOB or chest tightness. Have not tested for Covid.)   Symptoms started approx 1 week ago Just got worse beginning of the week (Monday) Sick contact ( friend or someone out and about) they do not know what the contact was ill with At home covid test unavailable  Pfizer vaccines x 2 Mucinex with no relief Tussien OTC no help  No flu vaccine this season    Relevant past medical, surgical, family and social history reviewed and updated as indicated. Interim medical history since our last visit reviewed. Allergies and medications reviewed and updated. Outpatient Medications Prior to Visit  Medication Sig Dispense Refill   albuterol (VENTOLIN HFA) 108 (90 Base) MCG/ACT inhaler INHALE 2 PUFFS BY  MOUTH EVERY 4 HOURS AS NEEDED FOR WHEEZE OR FOR SHORTNESS OF BREATH 6.7 each 0   budesonide-formoterol (SYMBICORT) 160-4.5 MCG/ACT inhaler INHALE 2 PUFFS INTO THE LUNGS TWICE A DAY 10.2 each 5   clopidogrel (PLAVIX) 75 MG tablet TAKE 1 TABLET BY MOUTH EVERY DAY 90 tablet 3   diclofenac Sodium (VOLTAREN) 1 % GEL Apply 2 g topically 4 (four) times daily. 50 g 0   DULoxetine (CYMBALTA) 30 MG capsule Take 1 capsule (30 mg total) by mouth daily. For depression. 90 capsule 0   empagliflozin (JARDIANCE) 10 MG TABS tablet TAKE 1 TABLET BY MOUTH EVERY MORNING FOR DIABETES 90 tablet 3   fish oil-omega-3 fatty acids 1000 MG capsule Take 1 g by mouth daily.       fluticasone (FLONASE) 50 MCG/ACT nasal spray PLACE 1 SPRAY INTO BOTH NOSTRILS DAILY AS NEEDED FOR ALLERGIES OR RHINITIS. 48 mL 3   furosemide (LASIX) 20 MG tablet TAKE 1 TABLET BY MOUTH EVERY DAY 90 tablet 3   gabapentin (NEURONTIN) 300 MG capsule Take 1 capsule (300 mg total) by mouth 2 (two) times daily. For neuropathy 180 capsule 3   glucose blood (ONE TOUCH ULTRA TEST) test strip USE TO TEST UP TO 4 TIMES DAILY 100 each 2   isosorbide mononitrate (IMDUR) 60 MG 24 hr tablet TAKE 1 TABLET BY MOUTH EVERY DAY 90 tablet 3   lisinopril (ZESTRIL) 2.5 MG tablet TAKE 1 TABLET BY MOUTH EVERY DAY 90 tablet 3   metFORMIN (GLUCOPHAGE-XR) 500 MG 24 hr tablet Take  1 tablet (500 mg total) by mouth 2 (two) times daily with a meal. For diabetes. 180 tablet 1   metoprolol succinate (TOPROL-XL) 25 MG 24 hr tablet Take 0.5 tablets (12.5 mg total) by mouth daily. 45 tablet 3   nitroGLYCERIN (NITROSTAT) 0.4 MG SL tablet Place 1 tablet (0.4 mg total) under the tongue every 5 (five) minutes as needed for chest pain. 25 tablet 0   pantoprazole (PROTONIX) 40 MG tablet TAKE 1 TABLET (40 MG TOTAL) BY MOUTH DAILY FOR HEARTBURN. 90 tablet 3   rosuvastatin (CRESTOR) 20 MG tablet TAKE 1 TABLET BY MOUTH EVERY DAY IN THE EVENING FOR CHOLESTEROL 90 tablet 0   sitaGLIPtin (JANUVIA)  100 MG tablet TAKE 1 TABLET BY MOUTH ONCE DAILY FOR DIABETES 90 tablet 1   tamsulosin (FLOMAX) 0.4 MG CAPS capsule TAKE 1 CAPSULE (0.4 MG TOTAL) BY MOUTH 2 (TWO) TIMES DAILY. 180 capsule 3   warfarin (COUMADIN) 5 MG tablet TAKE 1/2 TABLET DAILY EXCEPT TAKE 1 TABLET ON MONDAYS, THURSDAYS & Saturdays OR AS DIRECTED BY COUMADIN CLINIC 90 tablet 0   No facility-administered medications prior to visit.     Per HPI unless specifically indicated in ROS section below Review of Systems  Constitutional:  Positive for chills, fatigue and fever.  HENT:  Positive for congestion and rhinorrhea. Negative for ear discharge, ear pain, sinus pressure, sinus pain and sore throat.   Respiratory:  Positive for cough (yellow). Negative for shortness of breath.   Cardiovascular:  Negative for chest pain.  Gastrointestinal:  Negative for abdominal pain, diarrhea, nausea and vomiting.  Musculoskeletal:  Positive for arthralgias and myalgias.  Neurological:  Negative for headaches.  Objective:  BP (!) 155/83 Comment: vitals per patient  Pulse 83   Temp 100 F (37.8 C)   Wt Readings from Last 3 Encounters:  02/15/21 227 lb 14.4 oz (103.4 kg)  02/02/21 224 lb (101.6 kg)  01/13/21 228 lb 3.2 oz (103.5 kg)       Physical exam: Gen: alert, NAD, not ill appearing Pulm: speaks in complete sentences without increased work of breathing Psych: normal mood, normal thought content      Results for orders placed or performed in visit on 03/01/21  POCT INR  Result Value Ref Range   INR 2.5 2.0 - 3.0   *Note: Due to a large number of results and/or encounters for the requested time period, some results have not been displayed. A complete set of results can be found in Results Review.   Assessment & Plan:   Problem List Items Addressed This Visit       Other   Acute cough - Primary    Has been trying over-the-counter regimens without great relief has done well with codeine-guaifenesin cough syrup in the past.   We will send a new prescription for patient.  Pending flu and COVID test.  Did discuss signs and symptoms which patient needs to seek urgent or emergent health care.  He acknowledged.  Pending results      Relevant Medications   guaiFENesin-codeine 100-10 MG/5ML syrup   Other Relevant Orders   Influenza A/B   Novel Coronavirus, NAA ([facility])   Body aches    Body aches along with fever over the past 2 days.  Did have sick contact unsure what contact had is an illness.  Pending flu and COVID tests.      Relevant Orders   Influenza A/B   Fever    Patient can continue take over-the-counter medications  if beneficial.  He is not allowed to use NSAIDs due to his anticoagulation.  Did encourage patient use Tylenol as directed to help with fever and body aches.  Pending flu and COVID test      Relevant Orders   Influenza A/B   Novel Coronavirus, NAA ([facility])     No orders of the defined types were placed in this encounter.  No orders of the defined types were placed in this encounter.  Phone call 7 minutes and 45 seconds   I discussed the assessment and treatment plan with the patient. The patient was provided an opportunity to ask questions and all were answered. The patient agreed with the plan and demonstrated an understanding of the instructions. The patient was advised to call back or seek an in-person evaluation if the symptoms worsen or if the condition fails to improve as anticipated.  Follow up plan: No follow-ups on file.  Romilda Garret, NP

## 2021-03-02 NOTE — Assessment & Plan Note (Signed)
Body aches along with fever over the past 2 days.  Did have sick contact unsure what contact had is an illness.  Pending flu and COVID tests.

## 2021-03-02 NOTE — Assessment & Plan Note (Signed)
Patient can continue take over-the-counter medications if beneficial.  He is not allowed to use NSAIDs due to his anticoagulation.  Did encourage patient use Tylenol as directed to help with fever and body aches.  Pending flu and COVID test

## 2021-03-02 NOTE — Addendum Note (Signed)
Addended by: Kris Mouton on: 03/02/2021 04:24 PM   Modules accepted: Orders

## 2021-03-02 NOTE — Assessment & Plan Note (Signed)
Has been trying over-the-counter regimens without great relief has done well with codeine-guaifenesin cough syrup in the past.  We will send a new prescription for patient.  Pending flu and COVID test.  Did discuss signs and symptoms which patient needs to seek urgent or emergent health care.  He acknowledged.  Pending results

## 2021-03-03 ENCOUNTER — Other Ambulatory Visit: Payer: Self-pay

## 2021-03-03 ENCOUNTER — Other Ambulatory Visit (INDEPENDENT_AMBULATORY_CARE_PROVIDER_SITE_OTHER): Payer: Medicare Other

## 2021-03-03 DIAGNOSIS — R051 Acute cough: Secondary | ICD-10-CM

## 2021-03-03 DIAGNOSIS — R52 Pain, unspecified: Secondary | ICD-10-CM

## 2021-03-03 DIAGNOSIS — R509 Fever, unspecified: Secondary | ICD-10-CM | POA: Diagnosis not present

## 2021-03-03 LAB — POCT INFLUENZA A/B
Influenza A, POC: NEGATIVE
Influenza B, POC: NEGATIVE

## 2021-03-04 ENCOUNTER — Other Ambulatory Visit: Payer: Self-pay | Admitting: Primary Care

## 2021-03-04 ENCOUNTER — Other Ambulatory Visit: Payer: Self-pay | Admitting: Urology

## 2021-03-04 DIAGNOSIS — E785 Hyperlipidemia, unspecified: Secondary | ICD-10-CM

## 2021-03-04 LAB — SARS-COV-2, NAA 2 DAY TAT

## 2021-03-04 LAB — NOVEL CORONAVIRUS, NAA: SARS-CoV-2, NAA: NOT DETECTED

## 2021-03-07 ENCOUNTER — Other Ambulatory Visit: Payer: Self-pay

## 2021-03-07 ENCOUNTER — Encounter: Payer: Self-pay | Admitting: Nurse Practitioner

## 2021-03-07 ENCOUNTER — Ambulatory Visit (INDEPENDENT_AMBULATORY_CARE_PROVIDER_SITE_OTHER): Payer: Medicare Other | Admitting: Nurse Practitioner

## 2021-03-07 VITALS — BP 122/64 | HR 82 | Temp 97.9°F | Resp 16 | Ht 71.0 in | Wt 214.2 lb

## 2021-03-07 DIAGNOSIS — J440 Chronic obstructive pulmonary disease with acute lower respiratory infection: Secondary | ICD-10-CM

## 2021-03-07 DIAGNOSIS — R051 Acute cough: Secondary | ICD-10-CM | POA: Diagnosis not present

## 2021-03-07 DIAGNOSIS — J209 Acute bronchitis, unspecified: Secondary | ICD-10-CM

## 2021-03-07 HISTORY — DX: Acute bronchitis, unspecified: J20.9

## 2021-03-07 MED ORDER — AMOXICILLIN-POT CLAVULANATE 875-125 MG PO TABS: 1.0000 | ORAL_TABLET | Freq: Two times a day (BID) | ORAL | 0 refills | Status: AC

## 2021-03-07 MED ORDER — GUAIFENESIN-CODEINE 100-10 MG/5ML PO SOLN: 5.0000 mL | Freq: Three times a day (TID) | ORAL | 0 refills | Status: AC | PRN

## 2021-03-07 MED ORDER — PREDNISONE 20 MG PO TABS: 20.0000 mg | ORAL_TABLET | Freq: Two times a day (BID) | ORAL | 0 refills | Status: AC

## 2021-03-07 NOTE — Assessment & Plan Note (Signed)
Patient has diagnosis of COPD having acute bronchitis on top with cough, sputum production, shortness of breath.  We will treat patient aggressively with Augmentin twice daily for 7 days and prednisone 40 mg for 5 days.  Did discuss with patient he is diabetic his sugar likely will go up with use of steroids.  He acknowledged he does check his sugar several times a week. Start Augmentin, prednisone, guaifenesin-codeine cough syrup.  Continue to monitor.  He will let us know if he does not start improving with appropriate treatment.  Reviewed signs and symptoms with patient as when to seek urgent or emergent health care.

## 2021-03-07 NOTE — Progress Notes (Signed)
Acute Office Visit  Subjective:    Patient ID: [name] Cook, male    DOB: July 04, 1950, 70 y.o.   MRN: [id number]  Chief Complaint  Patient presents with   Fatigue    Has not felt well since around 02/28/21, flu and covid test were negative on 03/03/21, cough-yellow phlegm comes up, decreased appetite, SOB. Has not slept well over the weekend due to the cough. Has been taking Mucinnex and Tussin.    HPI Patient is in today for cough  Has been tested for flu and Covid through the office and both were negative.  His symptoms have not improved any with conservative therapy.  He has been using tussien and mucinex with little relief.  Did write for codeine cough syrup and patient states that helped with the cough and resting at night.  Former smoker Does have COPD. Has been using his Symbicort as prescribed but has not used the albuterol inhaler.  Past Medical History:  Diagnosis Date   Arthritis    "knees and lower back" (03/14/2013)   Atrial flutter ([place])    radiofrequency ablation in 2001   CAD (coronary artery disease)    a. Nonobstructive. Cardiac cath in 2001-50% mid RI, normal LM, LAD, RCA b. cath 10/16/2014 95% mid RCA treated with DES, 99% ost D1 medical management due to small aneurysmal segment   Chronic anticoagulation    chronic Coumadin anticoagulation   Chronic obstructive pulmonary disease ([place]) 04/20/2011   Diabetes mellitus, type 2 (HCC)    GERD (gastroesophageal reflux disease)    Hyperlipidemia    Hypertension    with hypertensive heart disease   Left knee pain 10/25/2017   medial   Obesity    Persistent atrial fibrillation ([place])    recurrent atrial flutter since 2001 s/p DCCVs, multiple failed AADs, h/o tachy-mediated cardiomyopathy   Shortness of breath    "can come on at any time" (03/14/2013)   Sleep apnea    "dx'd; couldn't wear the mask" (03/14/2013)   Tobacco abuse     Past Surgical History:  Procedure Laterality Date   ATRIAL FLUTTER ABLATION   2002   atrial flutter; subsequently developed atrial fibrillation   AV NODE ABLATION  01/24/2013   CARDIAC CATHETERIZATION  2002   CARDIAC CATHETERIZATION N/A 10/16/2014   Procedure: Left Heart Cath and Coronary Angiography;  Surgeon: [name], MD;  Location: [facility];  Service: Cardiovascular;  Laterality: N/A;   CARDIOVERSION  05/31/2011   Procedure: CARDIOVERSION;  Surgeon: Cristopher Estimable. Lattie Haw, MD;  Location: [facility];  Service: Cardiovascular;  Laterality: N/A;   CARPAL TUNNEL RELEASE Left 1980's   COLONOSCOPY WITH PROPOFOL N/A 12/30/2018   Procedure: COLONOSCOPY WITH PROPOFOL;  Surgeon: Jonathon Bellows, MD;  Location: [facility] ENDOSCOPY;  Service: Gastroenterology;  Laterality: N/A;   COLONOSCOPY WITH PROPOFOL N/A 12/04/2019   Procedure: COLONOSCOPY WITH PROPOFOL;  Surgeon: Jonathon Bellows, MD;  Location: [facility] ENDOSCOPY;  Service: Gastroenterology;  Laterality: N/A;   INSERT / REPLACE / REMOVE PACEMAKER  01/24/2013    Medtronic Adapta L dual-chamber pacemaker, serial number [id number]    LEFT HEART CATH AND CORONARY ANGIOGRAPHY N/A 06/07/2018   Procedure: LEFT HEART CATH AND CORONARY ANGIOGRAPHY;  Surgeon: [name], MD;  Location: [facility];  Service: Cardiovascular;  Laterality: N/A;   LEFT HEART CATHETERIZATION WITH CORONARY ANGIOGRAM N/A 03/17/2013   Procedure: LEFT HEART CATHETERIZATION WITH CORONARY ANGIOGRAM;  Surgeon: Burnell Blanks, MD; LAD mild dz, D1 branch 100%, inferior branch 99%,  CFX OK, RCA 50%, EF 65%     LOOP RECORDER IMPLANT  2002   PERMANENT PACEMAKER INSERTION N/A 01/24/2013   Procedure: PERMANENT PACEMAKER INSERTION;  Surgeon: [name], MD;  Location: [facility] C[facility] LAB;  Service: Cardiovascular;  Laterality: N/A;   TIBIAL TUBERCLERPLASTY  ~ 2003    Family History  Problem Relation Age of Onset   Alzheimer's disease Mother    Osteoporosis Mother     Social History   Socioeconomic History   Marital status: Widowed    Spouse name: Not on  file   Number of children: 1   Years of education: Not on file   Highest education level: Not on file  Occupational History   Occupation: Unemployed    Employer: UNEMPLOYED  Tobacco Use   Smoking status: Former    Packs/day: 1.00    Years: 42.00    Pack years: 42.00    Types: Cigarettes    Quit date: 12/31/2011    Years since quitting: 9.1   Smokeless tobacco: Never  Vaping Use   Vaping Use: Never used  Substance and Sexual Activity   Alcohol use: Not Currently    Comment: 03/14/2013 "stopped drinking back in 2002; never had problem w/it"   Drug use: No   Sexual activity: Not Currently  Other Topics Concern   Not on file  Social History Narrative   Single.   Retired.    1 son, deceased.    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   Enjoys playing on the computer.       Social Determinants of Health   Financial Resource Strain: Low Risk    Difficulty of Paying Living Expenses: Not very hard  Food Insecurity: No Food Insecurity   Worried About Charity fundraiser in the Last Year: Never true   Ran Out of Food in the Last Year: Never true  Transportation Needs: No Transportation Needs   Lack of Transportation (Medical): No   Lack of Transportation (Non-Medical): No  Physical Activity: Inactive   Days of Exercise per Week: 0 days   Minutes of Exercise per Session: 0 min  Stress: No Stress Concern Present   Feeling of Stress : Not at all  Social Connections: Not on file  Intimate Partner Violence: Not At Risk   Fear of Current or Ex-Partner: No   Emotionally Abused: No   Physically Abused: No   Sexually Abused: No    Outpatient Medications Prior to Visit  Medication Sig Dispense Refill   albuterol (VENTOLIN HFA) 108 (90 Base) MCG/ACT inhaler INHALE 2 PUFFS BY MOUTH EVERY 4 HOURS AS NEEDED FOR WHEEZE OR FOR SHORTNESS OF BREATH 6.7 each 0   budesonide-formoterol (SYMBICORT) 160-4.5 MCG/ACT inhaler INHALE 2 PUFFS INTO THE LUNGS  TWICE A DAY 10.2 each 5   clopidogrel (PLAVIX) 75 MG tablet TAKE 1 TABLET BY MOUTH EVERY DAY 90 tablet 3   diclofenac Sodium (VOLTAREN) 1 % GEL Apply 2 g topically 4 (four) times daily. 50 g 0   DULoxetine (CYMBALTA) 30 MG capsule Take 1 capsule (30 mg total) by mouth daily. For depression. 90 capsule 0   empagliflozin (JARDIANCE) 10 MG TABS tablet TAKE 1 TABLET BY MOUTH EVERY MORNING FOR DIABETES 90 tablet 3   fish oil-omega-3 fatty acids 1000 MG capsule Take 1 g by mouth daily.       fluticasone (FLONASE) 50 MCG/ACT nasal spray PLACE 1 SPRAY INTO BOTH NOSTRILS DAILY AS NEEDED FOR ALLERGIES  OR RHINITIS. 48 mL 3   furosemide (LASIX) 20 MG tablet TAKE 1 TABLET BY MOUTH EVERY DAY 90 tablet 3   gabapentin (NEURONTIN) 300 MG capsule Take 1 capsule (300 mg total) by mouth 2 (two) times daily. For neuropathy 180 capsule 3   glucose blood (ONE TOUCH ULTRA TEST) test strip USE TO TEST UP TO 4 TIMES DAILY 100 each 2   isosorbide mononitrate (IMDUR) 60 MG 24 hr tablet TAKE 1 TABLET BY MOUTH EVERY DAY 90 tablet 3   lisinopril (ZESTRIL) 2.5 MG tablet TAKE 1 TABLET BY MOUTH EVERY DAY 90 tablet 3   metFORMIN (GLUCOPHAGE-XR) 500 MG 24 hr tablet Take 1 tablet (500 mg total) by mouth 2 (two) times daily with a meal. For diabetes. 180 tablet 1   metoprolol succinate (TOPROL-XL) 25 MG 24 hr tablet Take 0.5 tablets (12.5 mg total) by mouth daily. 45 tablet 3   nitroGLYCERIN (NITROSTAT) 0.4 MG SL tablet Place 1 tablet (0.4 mg total) under the tongue every 5 (five) minutes as needed for chest pain. 25 tablet 0   pantoprazole (PROTONIX) 40 MG tablet TAKE 1 TABLET (40 MG TOTAL) BY MOUTH DAILY FOR HEARTBURN. 90 tablet 3   rosuvastatin (CRESTOR) 20 MG tablet TAKE 1 TABLET BY MOUTH EVERY DAY IN THE EVENING FOR CHOLESTEROL 90 tablet 1   sitaGLIPtin (JANUVIA) 100 MG tablet TAKE 1 TABLET BY MOUTH ONCE DAILY FOR DIABETES 90 tablet 1   tamsulosin (FLOMAX) 0.4 MG CAPS capsule TAKE 1 CAPSULE (0.4 MG TOTAL) BY MOUTH 2 (TWO) TIMES  DAILY. 180 capsule 3   warfarin (COUMADIN) 5 MG tablet TAKE 1/2 TABLET DAILY EXCEPT TAKE 1 TABLET ON MONDAYS, THURSDAYS & Saturdays OR AS DIRECTED BY COUMADIN CLINIC 90 tablet 0   guaiFENesin-codeine 100-10 MG/5ML syrup Take 5 mLs by mouth 3 (three) times daily as needed for up to 5 days for cough. 75 mL 0   No facility-administered medications prior to visit.    No Known Allergies  Review of Systems  Constitutional:  Positive for chills and fatigue. Negative for fever.  HENT:  Positive for congestion. Negative for ear discharge, ear pain, sinus pressure, sinus pain and sore throat.   Respiratory:  Positive for cough (yellow) and shortness of breath.   Cardiovascular:  Negative for chest pain.  Gastrointestinal:  Negative for abdominal pain, diarrhea, nausea and vomiting.  Musculoskeletal:  Positive for arthralgias and myalgias.  Neurological:  Positive for headaches.      Objective:    Physical Exam Vitals and nursing note reviewed.  Constitutional:      Appearance: Normal appearance.  HENT:     Right Ear: Tympanic membrane, ear canal and external ear normal. There is no impacted cerumen.     Left Ear: Tympanic membrane, ear canal and external ear normal. There is no impacted cerumen.     Nose:     Right Sinus: No maxillary sinus tenderness or frontal sinus tenderness.     Left Sinus: No maxillary sinus tenderness or frontal sinus tenderness.     Mouth/Throat:     Mouth: Mucous membranes are moist.     Pharynx: Oropharynx is clear.  Eyes:     Extraocular Movements: Extraocular movements intact.  Cardiovascular:     Rate and Rhythm: Normal rate and regular rhythm.  Pulmonary:     Effort: Pulmonary effort is normal.     Breath sounds: Normal breath sounds.  Abdominal:     General: Abdomen is flat.  Neurological:  Mental Status: He is alert.  Psychiatric:        Mood and Affect: Mood normal.        Behavior: Behavior normal.        Thought Content: Thought content  normal.        Judgment: Judgment normal.    BP 122/64   Pulse 82   Temp 97.9 F (36.6 C)   Resp 16   Ht 5\' 11"  (1.803 m)   Wt 214 lb 4 oz (97.2 kg)   SpO2 96%   BMI 29.88 kg/m  Wt Readings from Last 3 Encounters:  03/07/21 214 lb 4 oz (97.2 kg)  02/15/21 227 lb 14.4 oz (103.4 kg)  02/02/21 224 lb (101.6 kg)    Health Maintenance Due  Topic Date Due   Pneumonia Vaccine 3+ Years old (29 - PPSV23 if available, else PCV20) 09/22/2018   TETANUS/TDAP  01/04/2021    There are no preventive care reminders to display for this patient.   Lab Results  Component Value Date   TSH 2.030 04/15/2019   Lab Results  Component Value Date   WBC 4.7 11/16/2020   HGB 14.6 11/16/2020   HCT 43.9 11/16/2020   MCV 97.9 11/16/2020   PLT 118.0 (L) 11/16/2020   Lab Results  Component Value Date   NA 139 11/16/2020   K 4.0 11/16/2020   CO2 25 11/16/2020   GLUCOSE 212 (H) 11/16/2020   BUN 19 11/16/2020   CREATININE 1.11 11/16/2020   BILITOT 0.6 11/16/2020   ALKPHOS 44 11/16/2020   AST 21 11/16/2020   ALT 20 11/16/2020   PROT 6.6 11/16/2020   ALBUMIN 4.2 11/16/2020   CALCIUM 9.8 11/16/2020   ANIONGAP 11 01/23/2020   GFR 68.24 11/16/2020   Lab Results  Component Value Date   CHOL 131 10/07/2020   Lab Results  Component Value Date   HDL 43.90 10/07/2020   Lab Results  Component Value Date   LDLCALC 55 10/07/2020   Lab Results  Component Value Date   TRIG 161.0 (H) 10/07/2020   Lab Results  Component Value Date   CHOLHDL 3 10/07/2020   Lab Results  Component Value Date   HGBA1C 6.5 (A) 01/31/2021       Assessment & Plan:   Problem List Items Addressed This Visit       Respiratory   Acute bronchitis with COPD ([place]) - Primary    Patient has diagnosis of COPD having acute bronchitis on top with cough, sputum production, shortness of breath.  We will treat patient aggressively with Augmentin twice daily for 7 days and prednisone 40 mg for 5 days.  Did discuss  with patient he is diabetic his sugar likely will go up with use of steroids.  He acknowledged he does check his sugar several times a week. Start Augmentin, prednisone, guaifenesin-codeine cough syrup.  Continue to monitor.  He will let us know if he does not start improving with appropriate treatment.  Reviewed signs and symptoms with patient as when to seek urgent or emergent health care.      Relevant Medications   amoxicillin-clavulanate (AUGMENTIN) 875-125 MG tablet   guaiFENesin-codeine 100-10 MG/5ML syrup   predniSONE (DELTASONE) 20 MG tablet     Other   Acute cough    Patient symptoms with cough states the prescribed cough medication worked well.  He has been trying over-the-counter testing and Mucinex with some relief.  We will discontinue over-the-counter treatments and put him on prescribed treatments  only did discuss this with patient in office.  He acknowledged continue to monitor      Relevant Medications   guaiFENesin-codeine 100-10 MG/5ML syrup     No orders of the defined types were placed in this encounter.  This visit occurred during the SARS-CoV-2 public health emergency.  Safety protocols were in place, including screening questions prior to the visit, additional usage of staff PPE, and extensive cleaning of exam room while observing appropriate contact time as indicated for disinfecting solutions.   Romilda Garret, NP

## 2021-03-07 NOTE — Patient Instructions (Addendum)
Nice to see you today If you do not start improving over the next couple of days let us know.  Try using the albuterol inhaler as needed Follow up as scheduled

## 2021-03-07 NOTE — Assessment & Plan Note (Signed)
Patient symptoms with cough states the prescribed cough medication worked well.  He has been trying over-the-counter testing and Mucinex with some relief.  We will discontinue over-the-counter treatments and put him on prescribed treatments only did discuss this with patient in office.  He acknowledged continue to monitor

## 2021-03-08 ENCOUNTER — Telehealth: Payer: Self-pay | Admitting: Nurse Practitioner

## 2021-03-08 DIAGNOSIS — Z7901 Long term (current) use of anticoagulants: Secondary | ICD-10-CM

## 2021-03-08 NOTE — Telephone Encounter (Signed)
Left second VM for pt to return call. LVM on pt's girlfriend's number also.

## 2021-03-08 NOTE — Telephone Encounter (Signed)
Noted, thanks everyone! 

## 2021-03-08 NOTE — Telephone Encounter (Signed)
Pt called back. Pt reports he is starting to feel better but only a little. Advised pt he will need to go to [facility] on [address] for lab draw for INR. Scheduled for 845. Added order for INR. Advised pt to only take 1/2 tablet on Thurs and skip dose on Sat and take regular dose on all other days. Pt read back instructions and verbalized understanding. Advised this nurse will f/u when results are received.

## 2021-03-08 NOTE — Telephone Encounter (Signed)
[name] Cook,  I saw patient on 03/07/2021. He was placed on abx and steroids. He is on warfarin and I would like to make sure his INR gets checked post therapy as prednisone is know to increase the bleed times.   I am copying Anda Kraft, NP that way she is aware also.

## 2021-03-08 NOTE — Telephone Encounter (Signed)
Noted for prednisone and abx addition. Will contact pt to see if he can go to [facility] on [address] for lab draw as coumadin clinic is only there on Fridays or see if pt can go to [place] location for INR check.    LVM for pt to call to set up time for check.

## 2021-03-14 ENCOUNTER — Other Ambulatory Visit: Payer: Self-pay

## 2021-03-14 ENCOUNTER — Ambulatory Visit (INDEPENDENT_AMBULATORY_CARE_PROVIDER_SITE_OTHER): Payer: Medicare Other

## 2021-03-14 ENCOUNTER — Telehealth: Payer: Self-pay | Admitting: Internal Medicine

## 2021-03-14 ENCOUNTER — Other Ambulatory Visit (INDEPENDENT_AMBULATORY_CARE_PROVIDER_SITE_OTHER): Payer: Medicare Other

## 2021-03-14 DIAGNOSIS — Z7901 Long term (current) use of anticoagulants: Secondary | ICD-10-CM

## 2021-03-14 LAB — PROTIME-INR
INR: 1.8 ratio — ABNORMAL HIGH (ref 0.8–1.0)
Prothrombin Time: 19.4 s — ABNORMAL HIGH (ref 9.6–13.1)

## 2021-03-14 LAB — POCT INR: INR: 1.8 — AB (ref 2.0–3.0)

## 2021-03-14 NOTE — Patient Instructions (Addendum)
Pre visit review using our clinic review tool, if applicable. No additional management support is needed unless otherwise documented below in the visit note.  Increase dose today to take 7.5mg  and then continue 2.5mg  daily except take 5mg  on Mondays and Thursdays. Recheck in 4 wks at [facility], [address]

## 2021-03-14 NOTE — Progress Notes (Signed)
Increase dose today to take 7.5mg  and then continue 2.5mg  daily except take 5mg  on Mondays and Thursdays. Recheck in 4 wks at [facility], [address]

## 2021-03-14 NOTE — Telephone Encounter (Signed)
   [place] Pre-operative Risk Assessment    Patient Name: [name] Cook  DOB: 11-29-1950 MRN: [id number]  HEARTCARE STAFF:  - IMPORTANT!!!!!! Under Visit Info/Reason for Call, type in Other and utilize the format Clearance MM/DD/YY or Clearance TBD. Do not use dashes or single digits. - Please review there is not already an duplicate clearance open for this procedure. - If request is for dental extraction, please clarify the # of teeth to be extracted. - If the patient is currently at the dentist's office, call Pre-Op Callback Staff (MA/nurse) to input urgent request.  - If the patient is not currently in the dentist office, please route to the Pre-Op pool.  Request for surgical clearance:  What type of surgery is being performed? Penile Implant   When is this surgery scheduled? In February, but not scheduled   What type of clearance is required (medical clearance vs. Pharmacy clearance to hold med vs. Both)? Both   Are there any medications that need to be held prior to surgery and how long? Clopidogrel 5 days prior   Practice name and name of physician performing surgery? [facility], Dr. Festus Aloe   What is the office phone number? [phone]   7.   What is the office fax number? [phone]  8.   Anesthesia type (None, local, MAC, general) ? General    Trilby Drummer 03/14/2021, 3:35 PM  _________________________________________________________________   (provider comments below)

## 2021-03-15 NOTE — Telephone Encounter (Signed)
   Name: [name] Cook  DOB: [birth year]  MRN: [id number]   Primary Cardiologist: [name], MD  Chart reviewed as part of pre-operative protocol coverage. Patient was contacted 03/15/2021 in reference to pre-operative risk assessment for pending surgery as outlined below.  LORENZO ARSCOTT was last seen on 05/27/20 by Tommye Standard PAC.  Since that day, WILLAM MUNFORD has done well. He can complete more than 4.0 METS without angina. Per prior recommendations and given lack of interval change in history, he may hold plavix for 5 days prior to surgery. Restart plavix as soon as safe after surgery.   Coumadin is managed by PCP. Please reach out to her regarding coumadin hold.   Therefore, based on ACC/AHA guidelines, the patient would be at acceptable risk for the planned procedure without further cardiovascular testing.   The patient was advised that if he develops new symptoms prior to surgery to contact our office to arrange for a follow-up visit, and he verbalized understanding.  I will route this recommendation to the requesting party via Epic fax function and remove from pre-op pool. Please call with questions.  [place], PA 03/15/2021, 10:17 AM

## 2021-03-22 ENCOUNTER — Other Ambulatory Visit: Payer: Self-pay | Admitting: Primary Care

## 2021-03-22 ENCOUNTER — Telehealth: Payer: Self-pay

## 2021-03-22 DIAGNOSIS — F339 Major depressive disorder, recurrent, unspecified: Secondary | ICD-10-CM

## 2021-03-22 NOTE — Telephone Encounter (Signed)
Per appt notes pt already has appt scheduled with Gentry Fitz NP on 03/23/21 at 2:40 . Per Access note pt was given care advice if condition changed or worsened. Note has been sent to Gentry Fitz NP and [facility] CMA and will teams Joellen also.

## 2021-03-22 NOTE — Telephone Encounter (Signed)
LAST APPOINTMENT DATE: last addressed 01/23/2021   NEXT APPOINTMENT DATE: 03/23/2021    LAST REFILL: 12/30/2020  QTY: 90 no refill

## 2021-03-22 NOTE — Telephone Encounter (Signed)
Noted, will evaluate as scheduled.  

## 2021-03-22 NOTE — Telephone Encounter (Signed)
Pt called access nurse on 03/21/21 at 5:10 pm. Access nurse was under S drive but not in access nurse portal and could not copy the note from access in the S drive.the information on access note was pt wanted to schedule an appt due to glucose being 373 and pt did not think his med was working. Per access note pt cancelled the call. I was unable to reach pt by any contact #. I did speak with [name] (DPR not signed) to get another contact # for pt. [name] said that pt was on the phone with the doctors office now. Nena Jordan if pt needed anything further in regards to call he made on 03/21/21 at 5:10 pm to call [place] back. [name] voiced understanding. I will put copy of access nurse note in paperwork to come to [place] or Joellen can access in the access nurse S drive. Sending note to Gentry Fitz NP and [facility] CMA.

## 2021-03-22 NOTE — Telephone Encounter (Signed)
[facility] - Client TELEPHONE ADVICE RECORD [facility] Patient Name: [name] Cook Gender: Male DOB: Oct 14, 1950 Age: 70 Y 10 M 6 D Return Phone Number: [phone] (Primary) Address: City/ State/ Zip: [place] [address] Client [facility] - Client Client Site [place] - Day Contact Type Call Who Is Calling Patient / Member / Family / Caregiver Call Type Triage / Clinical Relationship To Patient Self Return Phone Number [phone] (Primary) Chief Complaint Blood Sugar High Reason for Call Symptomatic / Request for [place] states he is having a lot of head pressure, very tired, and he his blood sugar is very high. Translation Cook Nurse Assessment Nurse: Clovis Riley, RN, Georgina Peer Date/Time (Eastern Time): 03/22/2021 10:42:42 AM Confirm and document reason for call. If symptomatic, describe symptoms. ---Caller states he is having a lot of head pressure, very tired, and he his blood sugar is very high at 207 this morning. Cook fever. States he has a slight cough. Does the patient have any new or worsening symptoms? ---Yes Will a triage be completed? ---Yes Related visit to physician within the last 2 weeks? ---Cook Does the PT have any chronic conditions? (i.e. diabetes, asthma, this includes High risk factors for pregnancy, etc.) ---Yes List chronic conditions. ---diabetes, heart disease, Is this a behavioral health or substance abuse call? ---Cook Guidelines Guideline Title Affirmed Question Affirmed Notes Nurse Date/Time (Eastern Time) Diabetes - High Blood Sugar Blood glucose 70-240 mg/dL (3.9 -13.3 mmol/L) [place], RN, Georgina Peer 03/22/2021 10:44:38 AM Sinus Pain or Congestion [1] Sinus congestion (pressure, fullness) AND [2] present > 10 days Otelia Santee 03/22/2021 10:46:44 AM Disp. Time Eilene Ghazi Time) Disposition Final User 03/22/2021 10:46:07 AM Home  Care [place], RN, Georgina Peer PLEASE NOTE: All timestamps contained within this report are represented as [place] Standard Time. CONFIDENTIALTY NOTICE: This fax transmission is intended only for the addressee. It contains information that is legally privileged, confidential or otherwise protected from use or disclosure. If you are not the intended recipient, you are strictly prohibited from reviewing, disclosing, copying using or disseminating any of this information or taking any action in reliance on or regarding this information. If you have received this fax in error, please notify us immediately by telephone so that we can arrange for its return to [place]. Phone: [phone], Toll-Free: [phone], Fax: [phone] Page: 2 of 2 Call Id: [id number] 03/22/2021 10:48:42 AM SEE PCP WITHIN 3 DAYS Yes Clovis Riley, RN, Leilani Merl Disagree/Comply Comply Caller Understands Yes PreDisposition Did not know what to do Care Advice Given Per Guideline HOME CARE: * You should be able to treat this at home. HIGH BLOOD SUGAR (HYPERGLYCEMIA): * Definition: Fasting blood glucose of 126 mg/dL (7.0 mmol/L) or above, or random blood glucose over 200 mg/dL (11.1 mmol/L). * Symptoms of mildly high blood sugar: Frequent urination (peeing), increased thirst, fatigue, blurred vision. * Symptoms of severely high blood sugar: Confusion and coma. * Contributing factors: Not taking medicines as prescribed, eating a high calorie or high sugar diet, taking steroid medicines, and infection. DAILY BLOOD GLUCOSE GOALS: * You and your doctor (or NP/PA) should decide upon your blood glucose goals. Typical goals for most non-pregnant adults who perform daily finger-stick blood glucose testing at home are as follows. * Pre-prandial (before meal): 80-130 mg/dL (4.4-7.2 mmol/L) * Post-prandial (1-2 hours after a meal): Less than 180 mg/dL (10 mmol/L) * A1C Level: Less than 7% CARE ADVICE given per Diabetes - High Blood Sugar (Adult)  guideline.  CALL BACK IF: * Blood glucose over 300 mg/dL (16.7 mmol/L) two or more times in a row * You become worse SEE PCP WITHIN 3 DAYS: * You need to be seen within 2 or 3 days. * PCP VISIT: Call your doctor (or NP/PA) during regular office hours and make an appointment. A clinic or urgent care center are good places to go for care if your doctor's office is closed or you can't get an appointment. NOTE: If office will be open tomorrow, tell caller to call then, not in 3 days. NASAL WASHES FOR A STUFFY NOSE: * Introduction: Saline (salt water) nasal irrigation (nasal wash) is an effective and simple home remedy for treating stuffy nose and sinus congestion. The nose can be irrigated by pouring, spraying, or squirting salt water into the nose and then letting it run back out. * How it Helps: The salt water rinses out excess mucus and washes out any irritants (dust, allergens) that might be present. It also moistens the nasal cavity. PAIN MEDICINES: * For pain relief, you can take either acetaminophen, ibuprofen, or naproxen. * ACETAMINOPHEN - REGULAR STRENGTH TYLENOL: Take 650 mg (two 325 mg pills) by mouth every 4 to 6 hours as needed. Each Regular Strength Tylenol pill has 325 mg of acetaminophen. The most you should take is 10 pills a day (3,250 mg total). Note: In [place], the maximum is 12 pills a day (3,900 mg total). * IBUPROFEN (E.G., MOTRIN, ADVIL): Take 400 mg (two 200 mg pills) by mouth every 6 hours. The most you should take is 6 pills a day (1,200 mg total). CALL BACK IF: * Difficulty breathing (and not relieved by cleaning out nose) * You become worse CARE ADVICE given per Sinus Pain or Congestion (Adult) guideline

## 2021-03-23 ENCOUNTER — Encounter: Payer: Self-pay | Admitting: Primary Care

## 2021-03-23 ENCOUNTER — Ambulatory Visit (INDEPENDENT_AMBULATORY_CARE_PROVIDER_SITE_OTHER): Payer: Medicare Other | Admitting: Primary Care

## 2021-03-23 ENCOUNTER — Other Ambulatory Visit: Payer: Self-pay

## 2021-03-23 VITALS — BP 100/60 | HR 62 | Temp 96.9°F | Ht 71.0 in | Wt 215.0 lb

## 2021-03-23 DIAGNOSIS — E119 Type 2 diabetes mellitus without complications: Secondary | ICD-10-CM | POA: Diagnosis not present

## 2021-03-23 DIAGNOSIS — F339 Major depressive disorder, recurrent, unspecified: Secondary | ICD-10-CM

## 2021-03-23 MED ORDER — DULOXETINE HCL 60 MG PO CPEP: 60.0000 mg | ORAL_CAPSULE | Freq: Every day | ORAL | 0 refills | Status: DC

## 2021-03-23 NOTE — Patient Instructions (Addendum)
We increased your diabetes medication, metformin XR, to three tablets daily. Add one extra tablet of metformin in either the morning or evening.   We increased the dose of your depression medication, duloxetine (Cymbalta) to 60 mg. I sent a new prescription to your pharmacy.  Please update me in 2 weeks regarding your blood sugars. Be sure to reduce fried/fatty/fast food, snack food/junk food, sweets, sugar drinks.  Please update me in 4 weeks regarding your depression.  It was a pleasure to see you today!

## 2021-03-23 NOTE — Assessment & Plan Note (Signed)
Seems like this has deteriorated on duloxetine 30 mg.  Discussed options, we agree to increase his dose of duloxetine to 60 mg.   He will update in 4 weeks.

## 2021-03-23 NOTE — Progress Notes (Signed)
Subjective:    Patient ID: [name] Cook, male    DOB: 1950/10/14, 70 y.o.   MRN: [id number]  HPI  [name] Cook is a very pleasant 70 y.o. male with a history of hypertension, atrial fibrillation, CAD with MI, OSA, COPD, anxiety and depression, CKD who presents today glucose readings and depression.  1) Hyperglycemia: Evaluated via [facility] by Romilda Garret, NP on 03/02/21 for a one week history of URI symptoms that had worsened over the prior 2 days. He was tested for flu and Covid-19, both of which were negative. He was provided with cough medication with Codeine.  He returned in person on 03/07/21 for symptoms of cough, decreased appetite, fatigue. During this visit he was treated with Augmentin BID X 7 days and prednisone 40 mg x 5 days.   Today he endorses elevated glucose readings that began about one week ago. He is checking glucose readings once daily and historically they have been running in the 130's. Over the last week he's noticed readings in the 200's and one reading at 376. Today his glucose reading was 276.   He denies changes in his diet, polydipsia. He endorses compliance to all of his diabetes medications. He has noticed increased urination during the day.   2) Depression: He has noticed feeling sad/down, fatigued, doesn't want to do anything, and "short tempered. He endorses compliance to Cymbalta 30 mg daily for depression and believes that it was effective initially.   Review of Systems  Respiratory:  Negative for shortness of breath.   Cardiovascular:  Negative for chest pain.  Endocrine: Positive for polyuria. Negative for polydipsia and polyphagia.  Neurological:  Negative for headaches.  Psychiatric/Behavioral:         See HPI        Past Medical History:  Diagnosis Date   Arthritis    "knees and lower back" (03/14/2013)   Atrial flutter ([place])    radiofrequency ablation in 2001   CAD (coronary artery disease)    a. Nonobstructive. Cardiac cath in 2001-50%  mid RI, normal LM, LAD, RCA b. cath 10/16/2014 95% mid RCA treated with DES, 99% ost D1 medical management due to small aneurysmal segment   Chronic anticoagulation    chronic Coumadin anticoagulation   Chronic obstructive pulmonary disease ([place]) 04/20/2011   Diabetes mellitus, type 2 (HCC)    GERD (gastroesophageal reflux disease)    Hyperlipidemia    Hypertension    with hypertensive heart disease   Left knee pain 10/25/2017   medial   Obesity    Persistent atrial fibrillation ([place])    recurrent atrial flutter since 2001 s/p DCCVs, multiple failed AADs, h/o tachy-mediated cardiomyopathy   Shortness of breath    "can come on at any time" (03/14/2013)   Sleep apnea    "dx'd; couldn't wear the mask" (03/14/2013)   Tobacco abuse     Social History   Socioeconomic History   Marital status: Widowed    Spouse name: Not on file   Number of children: 1   Years of education: Not on file   Highest education level: Not on file  Occupational History   Occupation: Unemployed    Employer: UNEMPLOYED  Tobacco Use   Smoking status: Former    Packs/day: 1.00    Years: 42.00    Pack years: 42.00    Types: Cigarettes    Quit date: 12/31/2011    Years since quitting: 9.2   Smokeless tobacco: Never  Vaping Use  Vaping Use: Never used  Substance and Sexual Activity   Alcohol use: Not Currently    Comment: 03/14/2013 "stopped drinking back in 2002; never had problem w/it"   Drug use: No   Sexual activity: Not Currently  Other Topics Concern   Not on file  Social History Narrative   Single.   Retired.    1 son, deceased.    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   Enjoys playing on the computer.       Social Determinants of Health   Financial Resource Strain: Low Risk    Difficulty of Paying Living Expenses: Not very hard  Food Insecurity: No Food Insecurity   Worried About Charity fundraiser in the Last Year: Never true   Ran Out of Food  in the Last Year: Never true  Transportation Needs: No Transportation Needs   Lack of Transportation (Medical): No   Lack of Transportation (Non-Medical): No  Physical Activity: Inactive   Days of Exercise per Week: 0 days   Minutes of Exercise per Session: 0 min  Stress: No Stress Concern Present   Feeling of Stress : Not at all  Social Connections: Not on file  Intimate Partner Violence: Not At Risk   Fear of Current or Ex-Partner: No   Emotionally Abused: No   Physically Abused: No   Sexually Abused: No    Past Surgical History:  Procedure Laterality Date   ATRIAL FLUTTER ABLATION  2002   atrial flutter; subsequently developed atrial fibrillation   AV NODE ABLATION  01/24/2013   CARDIAC CATHETERIZATION  2002   CARDIAC CATHETERIZATION N/A 10/16/2014   Procedure: Left Heart Cath and Coronary Angiography;  Surgeon: [name], MD;  Location: [facility];  Service: Cardiovascular;  Laterality: N/A;   CARDIOVERSION  05/31/2011   Procedure: CARDIOVERSION;  Surgeon: Cristopher Estimable. Lattie Haw, MD;  Location: [facility];  Service: Cardiovascular;  Laterality: N/A;   CARPAL TUNNEL RELEASE Left 1980's   COLONOSCOPY WITH PROPOFOL N/A 12/30/2018   Procedure: COLONOSCOPY WITH PROPOFOL;  Surgeon: Jonathon Bellows, MD;  Location: [facility] ENDOSCOPY;  Service: Gastroenterology;  Laterality: N/A;   COLONOSCOPY WITH PROPOFOL N/A 12/04/2019   Procedure: COLONOSCOPY WITH PROPOFOL;  Surgeon: Jonathon Bellows, MD;  Location: [facility] ENDOSCOPY;  Service: Gastroenterology;  Laterality: N/A;   INSERT / REPLACE / REMOVE PACEMAKER  01/24/2013    Medtronic Adapta L dual-chamber pacemaker, serial number [id number]    LEFT HEART CATH AND CORONARY ANGIOGRAPHY N/A 06/07/2018   Procedure: LEFT HEART CATH AND CORONARY ANGIOGRAPHY;  Surgeon: [name], MD;  Location: [facility];  Service: Cardiovascular;  Laterality: N/A;   LEFT HEART CATHETERIZATION WITH CORONARY ANGIOGRAM N/A 03/17/2013   Procedure: LEFT HEART  CATHETERIZATION WITH CORONARY ANGIOGRAM;  Surgeon: Burnell Blanks, MD; LAD mild dz, D1 branch 100%, inferior branch 99%, CFX OK, RCA 50%, EF 65%     LOOP RECORDER IMPLANT  2002   PERMANENT PACEMAKER INSERTION N/A 01/24/2013   Procedure: PERMANENT PACEMAKER INSERTION;  Surgeon: [name], MD;  Location: [facility] CATH LAB;  Service: Cardiovascular;  Laterality: N/A;   TIBIAL TUBERCLERPLASTY  ~ 2003    Family History  Problem Relation Age of Onset   Alzheimer's disease Mother    Osteoporosis Mother     No Known Allergies  Current Outpatient Medications on File Prior to Visit  Medication Sig Dispense Refill   albuterol (VENTOLIN HFA) 108 (90 Base) MCG/ACT inhaler INHALE 2 PUFFS BY  MOUTH EVERY 4 HOURS AS NEEDED FOR WHEEZE OR FOR SHORTNESS OF BREATH 6.7 each 0   budesonide-formoterol (SYMBICORT) 160-4.5 MCG/ACT inhaler INHALE 2 PUFFS INTO THE LUNGS TWICE A DAY 10.2 each 5   clopidogrel (PLAVIX) 75 MG tablet TAKE 1 TABLET BY MOUTH EVERY DAY 90 tablet 3   diclofenac Sodium (VOLTAREN) 1 % GEL Apply 2 g topically 4 (four) times daily. 50 g 0   empagliflozin (JARDIANCE) 10 MG TABS tablet TAKE 1 TABLET BY MOUTH EVERY MORNING FOR DIABETES 90 tablet 3   fish oil-omega-3 fatty acids 1000 MG capsule Take 1 g by mouth daily.       fluticasone (FLONASE) 50 MCG/ACT nasal spray PLACE 1 SPRAY INTO BOTH NOSTRILS DAILY AS NEEDED FOR ALLERGIES OR RHINITIS. 48 mL 3   furosemide (LASIX) 20 MG tablet TAKE 1 TABLET BY MOUTH EVERY DAY 90 tablet 3   gabapentin (NEURONTIN) 300 MG capsule Take 1 capsule (300 mg total) by mouth 2 (two) times daily. For neuropathy 180 capsule 3   glucose blood (ONE TOUCH ULTRA TEST) test strip USE TO TEST UP TO 4 TIMES DAILY 100 each 2   isosorbide mononitrate (IMDUR) 60 MG 24 hr tablet TAKE 1 TABLET BY MOUTH EVERY DAY 90 tablet 3   lisinopril (ZESTRIL) 2.5 MG tablet TAKE 1 TABLET BY MOUTH EVERY DAY 90 tablet 3   metFORMIN (GLUCOPHAGE-XR) 500 MG 24 hr tablet Take 1 tablet (500 mg  total) by mouth 2 (two) times daily with a meal. For diabetes. 180 tablet 1   metoprolol succinate (TOPROL-XL) 25 MG 24 hr tablet Take 0.5 tablets (12.5 mg total) by mouth daily. 45 tablet 3   nitroGLYCERIN (NITROSTAT) 0.4 MG SL tablet Place 1 tablet (0.4 mg total) under the tongue every 5 (five) minutes as needed for chest pain. 25 tablet 0   pantoprazole (PROTONIX) 40 MG tablet TAKE 1 TABLET (40 MG TOTAL) BY MOUTH DAILY FOR HEARTBURN. 90 tablet 3   rosuvastatin (CRESTOR) 20 MG tablet TAKE 1 TABLET BY MOUTH EVERY DAY IN THE EVENING FOR CHOLESTEROL 90 tablet 1   sitaGLIPtin (JANUVIA) 100 MG tablet TAKE 1 TABLET BY MOUTH ONCE DAILY FOR DIABETES 90 tablet 1   tamsulosin (FLOMAX) 0.4 MG CAPS capsule TAKE 1 CAPSULE (0.4 MG TOTAL) BY MOUTH 2 (TWO) TIMES DAILY. 180 capsule 3   warfarin (COUMADIN) 5 MG tablet TAKE 1/2 TABLET DAILY EXCEPT TAKE 1 TABLET ON MONDAYS, THURSDAYS & Saturdays OR AS DIRECTED BY COUMADIN CLINIC 90 tablet 0   No current facility-administered medications on file prior to visit.    BP 100/60   Pulse 62   Temp (!) 96.9 F (36.1 C) (Temporal)   Ht 5\' 11"  (1.803 m)   Wt 215 lb (97.5 kg)   SpO2 95%   BMI 29.99 kg/m  Objective:   Physical Exam Cardiovascular:     Rate and Rhythm: Normal rate and regular rhythm.  Pulmonary:     Effort: Pulmonary effort is normal.     Breath sounds: Normal breath sounds. No wheezing or rales.  Musculoskeletal:     Cervical back: Neck supple.  Skin:    General: Skin is warm and dry.  Neurological:     Mental Status: He is alert and oriented to person, place, and time.          Assessment & Plan:      This visit occurred during the SARS-CoV-2 public health emergency.  Safety protocols were in place, including screening questions prior to the  visit, additional usage of staff PPE, and extensive cleaning of exam room while observing appropriate contact time as indicated for disinfecting solutions.

## 2021-03-23 NOTE — Assessment & Plan Note (Addendum)
Suspect hyperglycemia is secondary to recent steroid use, especially given no dietary or medication changes. Discussed this today.  Continue Jardiance 10 mg. Continue Januvia 100 mg. Increase metformin to 1500 mg daily by adding one additional metformin in AM or PM.  He will monitor glucose readings and update if no reduction in two weeks.  Labs pending.

## 2021-03-24 ENCOUNTER — Other Ambulatory Visit: Payer: Self-pay | Admitting: Primary Care

## 2021-03-24 DIAGNOSIS — E119 Type 2 diabetes mellitus without complications: Secondary | ICD-10-CM

## 2021-03-24 LAB — CBC WITH DIFFERENTIAL/PLATELET
Basophils Absolute: 0.1 10*3/uL (ref 0.0–0.1)
Basophils Relative: 1.3 % (ref 0.0–3.0)
Eosinophils Absolute: 0.1 10*3/uL (ref 0.0–0.7)
Eosinophils Relative: 2 % (ref 0.0–5.0)
HCT: 43.6 % (ref 39.0–52.0)
Hemoglobin: 14.6 g/dL (ref 13.0–17.0)
Lymphocytes Relative: 23.3 % (ref 12.0–46.0)
Lymphs Abs: 1.5 10*3/uL (ref 0.7–4.0)
MCHC: 33.4 g/dL (ref 30.0–36.0)
MCV: 97.2 fl (ref 78.0–100.0)
Monocytes Absolute: 0.5 10*3/uL (ref 0.1–1.0)
Monocytes Relative: 7.7 % (ref 3.0–12.0)
Neutro Abs: 4.3 10*3/uL (ref 1.4–7.7)
Neutrophils Relative %: 65.7 % (ref 43.0–77.0)
Platelets: 194 10*3/uL (ref 150.0–400.0)
RBC: 4.48 Mil/uL (ref 4.22–5.81)
RDW: 13.9 % (ref 11.5–15.5)
WBC: 6.6 10*3/uL (ref 4.0–10.5)

## 2021-03-24 LAB — BASIC METABOLIC PANEL
BUN: 18 mg/dL (ref 6–23)
CO2: 27 mEq/L (ref 19–32)
Calcium: 10 mg/dL (ref 8.4–10.5)
Chloride: 100 mEq/L (ref 96–112)
Creatinine, Ser: 1.18 mg/dL (ref 0.40–1.50)
GFR: 63.25 mL/min (ref >60.00)
Glucose, Bld: 308 mg/dL — ABNORMAL HIGH (ref 70–99)
Potassium: 3.9 mEq/L (ref 3.5–5.1)
Sodium: 135 mEq/L (ref 135–145)

## 2021-03-24 NOTE — Telephone Encounter (Signed)
I pended the test strips but am not sure which [facility] he's referring to.  Okay to send in to preferred [facility].

## 2021-03-24 NOTE — Telephone Encounter (Signed)
Pt called asking if he can get some free style test strip called into [facility] in [place]. Please advise.

## 2021-03-25 ENCOUNTER — Ambulatory Visit (INDEPENDENT_AMBULATORY_CARE_PROVIDER_SITE_OTHER): Payer: Medicare Other

## 2021-03-25 DIAGNOSIS — I442 Atrioventricular block, complete: Secondary | ICD-10-CM

## 2021-03-25 LAB — CUP PACEART REMOTE DEVICE CHECK
Battery Impedance: 899 Ohm
Battery Remaining Longevity: 68 mo
Battery Voltage: 2.78 V
Brady Statistic RV Percent Paced: 100 %
Date Time Interrogation Session: 20221208171442
Implantable Lead Implant Date: 20141010
Implantable Lead Implant Date: 20141010
Implantable Lead Location: 753859
Implantable Lead Location: 753860
Implantable Lead Model: 5076
Implantable Lead Model: 5076
Implantable Pulse Generator Implant Date: 20141010
Lead Channel Impedance Value: 566 Ohm
Lead Channel Impedance Value: 67 Ohm
Lead Channel Pacing Threshold Amplitude: 0.875 V
Lead Channel Pacing Threshold Pulse Width: 0.4 ms
Lead Channel Setting Pacing Amplitude: 2.5 V
Lead Channel Setting Pacing Pulse Width: 0.4 ms
Lead Channel Setting Sensing Sensitivity: 4 mV

## 2021-03-25 MED ORDER — IGLUCOSE TEST STRIPS VI STRP: ORAL_STRIP | 3 refills | Status: DC

## 2021-03-25 NOTE — Telephone Encounter (Signed)
Called patient updated information and sent script.

## 2021-04-01 ENCOUNTER — Ambulatory Visit: Payer: Medicare Other

## 2021-04-04 NOTE — Progress Notes (Signed)
Remote pacemaker transmission.   

## 2021-04-08 ENCOUNTER — Ambulatory Visit (INDEPENDENT_AMBULATORY_CARE_PROVIDER_SITE_OTHER): Payer: Medicare Other | Admitting: Primary Care

## 2021-04-08 ENCOUNTER — Other Ambulatory Visit: Payer: Self-pay

## 2021-04-08 ENCOUNTER — Encounter: Payer: Self-pay | Admitting: Primary Care

## 2021-04-08 VITALS — BP 104/64 | HR 84 | Temp 98.7°F | Ht 71.0 in | Wt 222.0 lb

## 2021-04-08 DIAGNOSIS — I200 Unstable angina: Secondary | ICD-10-CM | POA: Diagnosis not present

## 2021-04-08 DIAGNOSIS — E119 Type 2 diabetes mellitus without complications: Secondary | ICD-10-CM

## 2021-04-08 DIAGNOSIS — F3341 Major depressive disorder, recurrent, in partial remission: Secondary | ICD-10-CM | POA: Diagnosis not present

## 2021-04-08 MED ORDER — BLOOD GLUCOSE MONITOR KIT: PACK | 0 refills | Status: DC

## 2021-04-08 MED ORDER — NITROGLYCERIN 0.4 MG SL SUBL: 0.4000 mg | SUBLINGUAL_TABLET | SUBLINGUAL | 0 refills | Status: DC | PRN

## 2021-04-08 NOTE — Patient Instructions (Signed)
Continue metformin XR 500 mg twice daily for diabetes. Continue Jardiance and Januvia for diabetes.  Continue Cymbalta 60 mg daily for depression. Notify your pharmacy when you need a refill.  It was a pleasure to see you today!  Diabetes Mellitus and Nutrition, Adult When you have diabetes, or diabetes mellitus, it is very important to have healthy eating habits because your blood sugar (glucose) levels are greatly affected by what you eat and drink. Eating healthy foods in the right amounts, at about the same times every day, can help you: Manage your blood glucose. Lower your risk of heart disease. Improve your blood pressure. Reach or maintain a healthy weight. What can affect my meal plan? Every person with diabetes is different, and each person has different needs for a meal plan. Your health care provider may recommend that you work with a dietitian to make a meal plan that is best for you. Your meal plan may vary depending on factors such as: The calories you need. The medicines you take. Your weight. Your blood glucose, blood pressure, and cholesterol levels. Your activity level. Other health conditions you have, such as heart or kidney disease. How do carbohydrates affect me? Carbohydrates, also called carbs, affect your blood glucose level more than any other type of food. Eating carbs raises the amount of glucose in your blood. It is important to know how many carbs you can safely have in each meal. This is different for every person. Your dietitian can help you calculate how many carbs you should have at each meal and for each snack. How does alcohol affect me? Alcohol can cause a decrease in blood glucose (hypoglycemia), especially if you use insulin or take certain diabetes medicines by mouth. Hypoglycemia can be a life-threatening condition. Symptoms of hypoglycemia, such as sleepiness, dizziness, and confusion, are similar to symptoms of having too much alcohol. Do not drink  alcohol if: Your health care provider tells you not to drink. You are pregnant, may be pregnant, or are planning to become pregnant. If you drink alcohol: Limit how much you have to: 0-1 drink a day for women. 0-2 drinks a day for men. Know how much alcohol is in your drink. In the [place] one drink equals one 12 oz bottle of beer (355 mL), one 5 oz glass of wine (148 mL), or one 1 oz glass of hard liquor (44 mL). Keep yourself hydrated with water, diet soda, or unsweetened iced tea. Keep in mind that regular soda, juice, and other mixers may contain a lot of sugar and must be counted as carbs. What are tips for following this plan? Reading food labels Start by checking the serving size on the Nutrition Facts label of packaged foods and drinks. The number of calories and the amount of carbs, fats, and other nutrients listed on the label are based on one serving of the item. Many items contain more than one serving per package. Check the total grams (g) of carbs in one serving. Check the number of grams of saturated fats and trans fats in one serving. Choose foods that have a low amount or none of these fats. Check the number of milligrams (mg) of salt (sodium) in one serving. Most people should limit total sodium intake to less than 2,300 mg per day. Always check the nutrition information of foods labeled as "low-fat" or "nonfat." These foods may be higher in added sugar or refined carbs and should be avoided. Talk to your dietitian to identify your daily  goals for nutrients listed on the label. Shopping Avoid buying canned, pre-made, or processed foods. These foods tend to be high in fat, sodium, and added sugar. Shop around the outside edge of the grocery store. This is where you will most often find fresh fruits and vegetables, bulk grains, fresh meats, and fresh dairy products. Cooking Use low-heat cooking methods, such as baking, instead of high-heat cooking methods, such as deep  frying. Cook using healthy oils, such as olive, canola, or sunflower oil. Avoid cooking with butter, cream, or high-fat meats. Meal planning Eat meals and snacks regularly, preferably at the same times every day. Avoid going long periods of time without eating. Eat foods that are high in fiber, such as fresh fruits, vegetables, beans, and whole grains. Eat 4-6 oz (112-168 g) of lean protein each day, such as lean meat, chicken, fish, eggs, or tofu. One ounce (oz) (28 g) of lean protein is equal to: 1 oz (28 g) of meat, chicken, or fish. 1 egg.  cup (62 g) of tofu. Eat some foods each day that contain healthy fats, such as avocado, nuts, seeds, and fish. What foods should I eat? Fruits Berries. Apples. Oranges. Peaches. Apricots. Plums. Grapes. Mangoes. Papayas. Pomegranates. Kiwi. Cherries. Vegetables Leafy greens, including lettuce, spinach, kale, chard, collard greens, mustard greens, and cabbage. Beets. Cauliflower. Broccoli. Carrots. Green beans. Tomatoes. Peppers. Onions. Cucumbers. Brussels sprouts. Grains Whole grains, such as whole-wheat or whole-grain bread, crackers, tortillas, cereal, and pasta. Unsweetened oatmeal. Quinoa. Brown or wild rice. Meats and other proteins Seafood. Poultry without skin. Lean cuts of poultry and beef. Tofu. Nuts. Seeds. Dairy Low-fat or fat-free dairy products such as milk, yogurt, and cheese. The items listed above may not be a complete list of foods and beverages you can eat and drink. Contact a dietitian for more information. What foods should I avoid? Fruits Fruits canned with syrup. Vegetables Canned vegetables. Frozen vegetables with butter or cream sauce. Grains Refined white flour and flour products such as bread, pasta, snack foods, and cereals. Avoid all processed foods. Meats and other proteins Fatty cuts of meat. Poultry with skin. Breaded or fried meats. Processed meat. Avoid saturated fats. Dairy Full-fat yogurt, cheese, or  milk. Beverages Sweetened drinks, such as soda or iced tea. The items listed above may not be a complete list of foods and beverages you should avoid. Contact a dietitian for more information. Questions to ask a health care provider Do I need to meet with a certified diabetes care and education specialist? Do I need to meet with a dietitian? What number can I call if I have questions? When are the best times to check my blood glucose? Where to find more information: American Diabetes Association: diabetes.org Academy of Nutrition and Dietetics: eatright.[employer] of Diabetes and Digestive and Kidney Diseases: [employer] Association of Diabetes Care & Education Specialists: diabeteseducator.org Summary It is important to have healthy eating habits because your blood sugar (glucose) levels are greatly affected by what you eat and drink. It is important to use alcohol carefully. A healthy meal plan will help you manage your blood glucose and lower your risk of heart disease. Your health care provider may recommend that you work with a dietitian to make a meal plan that is best for you. This information is not intended to replace advice given to you by your health care provider. Make sure you discuss any questions you have with your health care provider. Document Revised: 11/05/2019 Document Reviewed: 11/05/2019 Elsevier Patient Education  [place].

## 2021-04-08 NOTE — Assessment & Plan Note (Signed)
Significant improvement since dose increase of Cymbalta to 60 mg. Continue same.

## 2021-04-08 NOTE — Progress Notes (Addendum)
Subjective:    Patient ID: [name] Cook, male    DOB: [birth year], 70 y.o.   MRN: [id number]  HPI  [name] Cook is a very pleasant 70 y.o. male with a history of depression, type 2 diabetes, atrial fibrillation, hypertension, CAD, OSA, GERD, prostate cancer, CKD who presents today for follow up of diabetes and depression. He is also needing a refill of his nitroglycerin as he misplaced his bottle.   1) Type 2 Diabetes:  Current medications include: Metformin XR 1500 mg daily, Januvia 100 mg daily, Jardiance 10 mg daily.  He is checking his blood glucose 1 times daily and is getting readings of 120's. Since his last visit he did not increase his metformin to 1500 mg as he decided to change his diet.   He cut sweets/desserts out completely.   Last A1C: 6.5 in mid October 2022 Last Eye Exam: UTD  Last Foot Exam: UTD Pneumonia Vaccination: 2019 Urine Microalbumin: None. Managed on ACE-I Statin: rosuvastatin   Dietary changes since last visit: Cutting out sweets/desserts.    Exercise: None  He is needing a new glucometer.   2) Depression: Chronic and last evaluated on 03/23/21 for worsening depression with symptoms of feeling sad/down, fatigued, little motivation to do anything, irritability despite Cymbalta 30 mg. Given these symptoms, coupled with initial improvement on Cymbalta, we increased his dose of Cymbalta to 60 mg.   Since his last visit he's feeling much better. He endorses no longer feeling depressed. His irritability has improved. He is compliant to Cymbalta 60 mg daily and would like to continue.    Review of Systems  Respiratory:  Negative for shortness of breath.   Cardiovascular:  Negative for chest pain.  Neurological:  Negative for dizziness.  Psychiatric/Behavioral:         See HPI        Past Medical History:  Diagnosis Date   Arthritis    "knees and lower back" (03/14/2013)   Atrial flutter ([place])    radiofrequency ablation in 2001   CAD (coronary  artery disease)    a. Nonobstructive. Cardiac cath in 2001-50% mid RI, normal LM, LAD, RCA b. cath 10/16/2014 95% mid RCA treated with DES, 99% ost D1 medical management due to small aneurysmal segment   Chronic anticoagulation    chronic Coumadin anticoagulation   Chronic obstructive pulmonary disease ([place]) 04/20/2011   Diabetes mellitus, type 2 (HCC)    GERD (gastroesophageal reflux disease)    Hyperlipidemia    Hypertension    with hypertensive heart disease   Left knee pain 10/25/2017   medial   Obesity    Persistent atrial fibrillation ([place])    recurrent atrial flutter since 2001 s/p DCCVs, multiple failed AADs, h/o tachy-mediated cardiomyopathy   Shortness of breath    "can come on at any time" (03/14/2013)   Sleep apnea    "dx'd; couldn't wear the mask" (03/14/2013)   Tobacco abuse     Social History   Socioeconomic History   Marital status: Widowed    Spouse name: Not on file   Number of children: 1   Years of education: Not on file   Highest education level: Not on file  Occupational History   Occupation: Unemployed    Employer: UNEMPLOYED  Tobacco Use   Smoking status: Former    Packs/day: 1.00    Years: 42.00    Pack years: 42.00    Types: Cigarettes    Quit date: 12/31/2011    Years  since quitting: 9.2   Smokeless tobacco: Never  Vaping Use   Vaping Use: Never used  Substance and Sexual Activity   Alcohol use: Not Currently    Comment: 03/14/2013 "stopped drinking back in 2002; never had problem w/it"   Drug use: No   Sexual activity: Not Currently  Other Topics Concern   Not on file  Social History Narrative   Single.   Retired.    1 son, deceased.    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   Enjoys playing on the computer.       Social Determinants of Health   Financial Resource Strain: Low Risk    Difficulty of Paying Living Expenses: Not very hard  Food Insecurity: No Food Insecurity   Worried About  Charity fundraiser in the Last Year: Never true   Ran Out of Food in the Last Year: Never true  Transportation Needs: No Transportation Needs   Lack of Transportation (Medical): No   Lack of Transportation (Non-Medical): No  Physical Activity: Inactive   Days of Exercise per Week: 0 days   Minutes of Exercise per Session: 0 min  Stress: No Stress Concern Present   Feeling of Stress : Not at all  Social Connections: Not on file  Intimate Partner Violence: Not At Risk   Fear of Current or Ex-Partner: No   Emotionally Abused: No   Physically Abused: No   Sexually Abused: No    Past Surgical History:  Procedure Laterality Date   ATRIAL FLUTTER ABLATION  2002   atrial flutter; subsequently developed atrial fibrillation   AV NODE ABLATION  01/24/2013   CARDIAC CATHETERIZATION  2002   CARDIAC CATHETERIZATION N/A 10/16/2014   Procedure: Left Heart Cath and Coronary Angiography;  Surgeon: [name], MD;  Location: [facility];  Service: Cardiovascular;  Laterality: N/A;   CARDIOVERSION  05/31/2011   Procedure: CARDIOVERSION;  Surgeon: Cristopher Estimable. Lattie Haw, MD;  Location: [facility];  Service: Cardiovascular;  Laterality: N/A;   CARPAL TUNNEL RELEASE Left 1980's   COLONOSCOPY WITH PROPOFOL N/A 12/30/2018   Procedure: COLONOSCOPY WITH PROPOFOL;  Surgeon: Jonathon Bellows, MD;  Location: [facility];  Service: Gastroenterology;  Laterality: N/A;   COLONOSCOPY WITH PROPOFOL N/A 12/04/2019   Procedure: COLONOSCOPY WITH PROPOFOL;  Surgeon: Jonathon Bellows, MD;  Location: [facility] ENDOSCOPY;  Service: Gastroenterology;  Laterality: N/A;   INSERT / REPLACE / REMOVE PACEMAKER  01/24/2013    Medtronic Adapta L dual-chamber pacemaker, serial number [id number]    LEFT HEART CATH AND CORONARY ANGIOGRAPHY N/A 06/07/2018   Procedure: LEFT HEART CATH AND CORONARY ANGIOGRAPHY;  Surgeon: [name], MD;  Location: [place] [facility];  Service: Cardiovascular;  Laterality: N/A;   LEFT HEART CATHETERIZATION  WITH CORONARY ANGIOGRAM N/A 03/17/2013   Procedure: LEFT HEART CATHETERIZATION WITH CORONARY ANGIOGRAM;  Surgeon: Burnell Blanks, MD; LAD mild dz, D1 branch 100%, inferior branch 99%, CFX OK, RCA 50%, EF 65%     LOOP RECORDER IMPLANT  2002   PERMANENT PACEMAKER INSERTION N/A 01/24/2013   Procedure: PERMANENT PACEMAKER INSERTION;  Surgeon: [name], MD;  Location: [facility] CATH LAB;  Service: Cardiovascular;  Laterality: N/A;   TIBIAL TUBERCLERPLASTY  ~ 2003    Family History  Problem Relation Age of Onset   Alzheimer's disease Mother    Osteoporosis Mother     No Known Allergies  Current Outpatient Medications on File Prior to Visit  Medication Sig Dispense Refill  albuterol (VENTOLIN HFA) 108 (90 Base) MCG/ACT inhaler INHALE 2 PUFFS BY MOUTH EVERY 4 HOURS AS NEEDED FOR WHEEZE OR FOR SHORTNESS OF BREATH 6.7 each 0   budesonide-formoterol (SYMBICORT) 160-4.5 MCG/ACT inhaler INHALE 2 PUFFS INTO THE LUNGS TWICE A DAY 10.2 each 5   clopidogrel (PLAVIX) 75 MG tablet TAKE 1 TABLET BY MOUTH EVERY DAY 90 tablet 3   diclofenac Sodium (VOLTAREN) 1 % GEL Apply 2 g topically 4 (four) times daily. 50 g 0   DULoxetine (CYMBALTA) 60 MG capsule Take 1 capsule (60 mg total) by mouth daily. For depression. 90 capsule 0   empagliflozin (JARDIANCE) 10 MG TABS tablet TAKE 1 TABLET BY MOUTH EVERY MORNING FOR DIABETES 90 tablet 3   fish oil-omega-3 fatty acids 1000 MG capsule Take 1 g by mouth daily.       fluticasone (FLONASE) 50 MCG/ACT nasal spray PLACE 1 SPRAY INTO BOTH NOSTRILS DAILY AS NEEDED FOR ALLERGIES OR RHINITIS. 48 mL 3   furosemide (LASIX) 20 MG tablet TAKE 1 TABLET BY MOUTH EVERY DAY 90 tablet 3   gabapentin (NEURONTIN) 300 MG capsule Take 1 capsule (300 mg total) by mouth 2 (two) times daily. For neuropathy 180 capsule 3   glucose blood (IGLUCOSE TEST STRIPS) test strip Use to check blood sugars up to three times daily 300 each 3   isosorbide mononitrate (IMDUR) 60 MG 24 hr tablet TAKE  1 TABLET BY MOUTH EVERY DAY 90 tablet 3   lisinopril (ZESTRIL) 2.5 MG tablet TAKE 1 TABLET BY MOUTH EVERY DAY 90 tablet 3   metFORMIN (GLUCOPHAGE-XR) 500 MG 24 hr tablet Take 1 tablet (500 mg total) by mouth 2 (two) times daily with a meal. For diabetes. 180 tablet 1   metoprolol succinate (TOPROL-XL) 25 MG 24 hr tablet Take 0.5 tablets (12.5 mg total) by mouth daily. 45 tablet 3   pantoprazole (PROTONIX) 40 MG tablet TAKE 1 TABLET (40 MG TOTAL) BY MOUTH DAILY FOR HEARTBURN. 90 tablet 3   rosuvastatin (CRESTOR) 20 MG tablet TAKE 1 TABLET BY MOUTH EVERY DAY IN THE EVENING FOR CHOLESTEROL 90 tablet 1   sitaGLIPtin (JANUVIA) 100 MG tablet TAKE 1 TABLET BY MOUTH ONCE DAILY FOR DIABETES 90 tablet 1   tamsulosin (FLOMAX) 0.4 MG CAPS capsule TAKE 1 CAPSULE (0.4 MG TOTAL) BY MOUTH 2 (TWO) TIMES DAILY. 180 capsule 3   warfarin (COUMADIN) 5 MG tablet TAKE 1/2 TABLET DAILY EXCEPT TAKE 1 TABLET ON MONDAYS, THURSDAYS & Saturdays OR AS DIRECTED BY COUMADIN CLINIC 90 tablet 0   No current facility-administered medications on file prior to visit.    BP 104/64    Pulse 84    Temp 98.7 F (37.1 C) (Temporal)    Ht 5\' 11"  (1.803 m)    Wt 222 lb (100.7 kg)    SpO2 95%    BMI 30.96 kg/m  Objective:   Physical Exam Cardiovascular:     Rate and Rhythm: Normal rate and regular rhythm.  Pulmonary:     Effort: Pulmonary effort is normal.     Breath sounds: Normal breath sounds. No wheezing or rales.  Musculoskeletal:     Cervical back: Neck supple.  Skin:    General: Skin is warm and dry.  Neurological:     Mental Status: He is alert and oriented to person, place, and time.          Assessment & Plan:      This visit occurred during the SARS-CoV-2 public health emergency.  Safety  protocols were in place, including screening questions prior to the visit, additional usage of staff PPE, and extensive cleaning of exam room while observing appropriate contact time as indicated for disinfecting solutions.

## 2021-04-08 NOTE — Assessment & Plan Note (Signed)
Improving per patient, glucose readings appear to be improved.   Continue metformin XR 500 mg BID, Jardiance 10 mg, and Januvia 100 mg.   Rx for glucometer kit faxed to pharmacy. Follow up in 1 month for lab only appt, repeat A1C. Follow up in 6 months in person.

## 2021-04-13 ENCOUNTER — Telehealth: Payer: Self-pay | Admitting: *Deleted

## 2021-04-13 NOTE — Telephone Encounter (Signed)
Received a PA request from [facility] on Freestyle Test Strips.  Patient's insurance prefers Accu-Chek or One Touch.  Call and spoke with patient to confirm what kind of glucose meter patient has.  He is not currently at home but will call us back with name. May need to send in Rx for preferred meter and test strips.  Will await patient's return call.

## 2021-04-13 NOTE — Telephone Encounter (Signed)
Pt is checking on status of this message. Please advise

## 2021-04-14 NOTE — Addendum Note (Signed)
Addended by: Carter Kitten on: 04/14/2021 11:03 AM   Modules accepted: Orders

## 2021-04-14 NOTE — Telephone Encounter (Signed)
Spoke with [name] Cook.  He states he has an Accu-Chek meter and test strips.  Does not need anything further at this time.  PA cancelled on CoverMyMeds since he does not use Freestyle test strips.

## 2021-04-15 ENCOUNTER — Telehealth: Payer: Self-pay

## 2021-04-15 ENCOUNTER — Ambulatory Visit: Payer: Medicare Other

## 2021-04-15 DIAGNOSIS — Z7901 Long term (current) use of anticoagulants: Secondary | ICD-10-CM

## 2021-04-15 MED ORDER — WARFARIN SODIUM 5 MG PO TABS: ORAL_TABLET | ORAL | 0 refills | Status: DC

## 2021-04-15 NOTE — Telephone Encounter (Signed)
Pt NS coumadin clinic apt today. Contacted pt and he said he never got a reminder call. Pt Rs for next week. Pt requested refill of warfarin. Advised refill will be went in and if any changes to contact office. Pt denies any abnormal bruising or bleeding. Pt verbalized understanding.   Sent in refill.

## 2021-04-19 ENCOUNTER — Other Ambulatory Visit: Payer: Self-pay | Admitting: *Deleted

## 2021-04-19 DIAGNOSIS — Z87891 Personal history of nicotine dependence: Secondary | ICD-10-CM

## 2021-04-22 ENCOUNTER — Other Ambulatory Visit: Payer: Self-pay

## 2021-04-22 ENCOUNTER — Ambulatory Visit (INDEPENDENT_AMBULATORY_CARE_PROVIDER_SITE_OTHER): Payer: Commercial Managed Care - HMO

## 2021-04-22 DIAGNOSIS — Z7901 Long term (current) use of anticoagulants: Secondary | ICD-10-CM | POA: Diagnosis not present

## 2021-04-22 LAB — POCT INR: INR: 2.3 (ref 2.0–3.0)

## 2021-04-22 NOTE — Patient Instructions (Addendum)
Pre visit review using our clinic review tool, if applicable. No additional management support is needed unless otherwise documented below in the visit note.  Continue 2.5mg  daily except take 5mg  on Mondays and Thursdays. Recheck in 4 wks at [facility], [address]

## 2021-04-22 NOTE — Progress Notes (Signed)
Continue 2.5mg  daily except take 5mg  on Mondays and Thursdays. Recheck in 4 wks at [facility], [address]

## 2021-04-29 ENCOUNTER — Encounter: Payer: Self-pay | Admitting: Acute Care

## 2021-04-29 ENCOUNTER — Other Ambulatory Visit: Payer: Self-pay

## 2021-04-29 ENCOUNTER — Ambulatory Visit (INDEPENDENT_AMBULATORY_CARE_PROVIDER_SITE_OTHER): Payer: Commercial Managed Care - HMO | Admitting: Acute Care

## 2021-04-29 DIAGNOSIS — Z87891 Personal history of nicotine dependence: Secondary | ICD-10-CM

## 2021-04-29 NOTE — Progress Notes (Signed)
Virtual Visit via Telephone Note  I connected with [name] on 04/29/21 at 11:30 AM EST by telephone and verified that I am speaking with the correct person using two identifiers.  Location: Patient: At home Provider: [place], [place], [place], [address]    I discussed the limitations, risks, security and privacy concerns of performing an evaluation and management service by telephone and the availability of in person appointments. I also discussed with the patient that there may be a patient responsible charge related to this service. The patient expressed understanding and agreed to proceed.    Shared Decision Making Visit Lung Cancer Screening Program [phone])   Eligibility: Age 71 y.o. Pack Years Smoking History Calculation 45 pack year smoking history (# packs/per year x # years smoked) Recent History of coughing up blood  no Unexplained weight loss? no ( >Than 15 pounds within the last 6 months ) Prior History Lung / other cancer no (Diagnosis within the last 5 years already requiring surveillance chest CT Scans). Smoking Status Former Smoker Former Smokers: Years since quit: 7 years  Quit Date: 12/2013  Visit Components: Discussion included one or more decision making aids. yes Discussion included risk/benefits of screening. yes Discussion included potential follow up diagnostic testing for abnormal scans. yes Discussion included meaning and risk of over diagnosis. yes Discussion included meaning and risk of False Positives. yes Discussion included meaning of total radiation exposure. yes  Counseling Included: Importance of adherence to annual lung cancer LDCT screening. yes Impact of comorbidities on ability to participate in the program. yes Ability and willingness to under diagnostic treatment. yes  Smoking Cessation Counseling: Current Smokers:  Discussed importance of smoking cessation. yes Information about tobacco cessation classes and  interventions provided to patient. yes Patient provided with "ticket" for LDCT Scan. yes Symptomatic Patient. no  Counseling NA Diagnosis Code: Tobacco Use Z72.0 Asymptomatic Patient yes  Counseling (Intermediate counseling: > three minutes counseling) [id number] Former Smokers:  Discussed the importance of maintaining cigarette abstinence. yes Diagnosis Code: Personal History of Nicotine Dependence. Y81.448 Information about tobacco cessation classes and interventions provided to patient. Yes Patient provided with "ticket" for LDCT Scan. yes Written Order for Lung Cancer Screening with LDCT placed in Epic. Yes (CT Chest Lung Cancer Screening Low Dose W/O CM) [id number] Z12.2-Screening of respiratory organs Z87.891-Personal history of nicotine dependence  I spent 25 minutes of face to face time/virtual visit time  with Mr. Xu discussing the risks and benefits of lung cancer screening. We took the time to pause the power point at intervals to allow for questions to be asked and answered to ensure understanding. We discussed that he had taken the single most powerful action possible to decrease his risk of developing lung cancer when he quit smoking. I counseled him to remain smoke free, and to contact me if he ever had the desire to smoke again so that I can provide resources and tools to help support the effort to remain smoke free. We discussed the time and location of the scan, and that either  Doroteo Glassman RN, Joella Prince, RN or I  or I will call / send a letter with the results within  24-72 hours of receiving them. He has the office contact information in the event he needs to speak with me,  He verbalized understanding of all of the above and had no further questions upon leaving the office.     I explained to the patient that there has been a  high incidence of coronary artery disease noted on these exams. I explained that this is a non-gated exam therefore degree or severity cannot be  determined. This patient is on statin therapy. I have asked the patient to follow-up with their PCP regarding any incidental finding of coronary artery disease and management with diet or medication as they feel is clinically indicated. The patient verbalized understanding of the above and had no further questions.     Magdalen Spatz, NP 04/29/2021

## 2021-04-29 NOTE — Patient Instructions (Signed)
Thank you for participating in the [facility] Screening [facility]. °It was our pleasure to meet you today. °We will call you with the results of your scan within the next few days. °Your scan will be assigned a Lung RADS category score by the physicians reading the scans.  °This Lung RADS score determines follow up scanning.  °See below for description of categories, and follow up screening recommendations. °We will be in touch to schedule your follow up screening annually or based on recommendations of our providers. °We will fax a copy of your scan results to your Primary Care Physician, or the physician who referred you to the program, to ensure they have the results. °Please call the office if you have any questions or concerns regarding your scanning experience or results.  °Our office number is [phone]. °Please speak with Denise Phelps, RN. She is our Lung Cancer Screening RN. °If she is unavailable when you call, please have the office staff send her a message. She will return your call at her earliest convenience. °Remember, if your scan is normal, we will scan you annually as long as you continue to meet the criteria for the program. (Age 55-77, Current smoker or smoker who has quit within the last 15 years). °If you are a smoker, remember, quitting is the single most powerful action that you can take to decrease your risk of lung cancer and other pulmonary, breathing related problems. °We know quitting is hard, and we are here to help.  °Please let us know if there is anything we can do to help you meet your goal of quitting. °If you are a former smoker, congratulations. We are proud of you! Remain smoke free! °Remember you can refer friends or family members through the number above.  °We will screen them to make sure they meet criteria for the program. °Thank you for helping us take better care of you by participating in Lung Screening. ° °You can receive free nicotine replacement therapy  ( patches, gum or mints) by calling 1-800-QUIT NOW. Please call so we can get you on the path to becoming  a non-smoker. I know it is hard, but you can do this! ° °Lung RADS Categories: ° °Lung RADS 1: no nodules or definitely non-concerning nodules.  °Recommendation is for a repeat annual scan in 12 months. ° °Lung RADS 2:  nodules that are non-concerning in appearance and behavior with a very low likelihood of becoming an active cancer. °Recommendation is for a repeat annual scan in 12 months. ° °Lung RADS 3: nodules that are probably non-concerning , includes nodules with a low likelihood of becoming an active cancer.  Recommendation is for a 6-month repeat screening scan. Often noted after an upper respiratory illness. We will be in touch to make sure you have no questions, and to schedule your 6-month scan. ° °Lung RADS 4 A: nodules with concerning findings, recommendation is most often for a follow up scan in 3 months or additional testing based on our provider's assessment of the scan. We will be in touch to make sure you have no questions and to schedule the recommended 3 month follow up scan. ° °Lung RADS 4 B:  indicates findings that are concerning. We will be in touch with you to schedule additional diagnostic testing based on our provider's  assessment of the scan. ° °Hypnosis for smoking cessation  °[employer] °[phone] ° °Acupuncture for smoking cessation  °[facility] °[phone]  °

## 2021-05-02 ENCOUNTER — Ambulatory Visit: Payer: Medicare Other

## 2021-05-04 ENCOUNTER — Ambulatory Visit: Payer: Medicare Other

## 2021-05-06 ENCOUNTER — Ambulatory Visit
Admission: RE | Admit: 2021-05-06 | Discharge: 2021-05-06 | Disposition: A | Discharge status: Home / Self Care | Payer: Commercial Managed Care - HMO | Source: Ambulatory Visit | Attending: Acute Care | Admitting: Acute Care

## 2021-05-06 DIAGNOSIS — I2510 Atherosclerotic heart disease of native coronary artery without angina pectoris: Secondary | ICD-10-CM | POA: Diagnosis not present

## 2021-05-06 DIAGNOSIS — Z87891 Personal history of nicotine dependence: Secondary | ICD-10-CM

## 2021-05-06 DIAGNOSIS — J432 Centrilobular emphysema: Secondary | ICD-10-CM | POA: Diagnosis not present

## 2021-05-06 DIAGNOSIS — J929 Pleural plaque without asbestos: Secondary | ICD-10-CM | POA: Diagnosis not present

## 2021-05-10 ENCOUNTER — Other Ambulatory Visit (INDEPENDENT_AMBULATORY_CARE_PROVIDER_SITE_OTHER): Payer: Commercial Managed Care - HMO

## 2021-05-10 ENCOUNTER — Other Ambulatory Visit: Payer: Self-pay

## 2021-05-10 DIAGNOSIS — E119 Type 2 diabetes mellitus without complications: Secondary | ICD-10-CM

## 2021-05-10 LAB — HEMOGLOBIN A1C: Hgb A1c MFr Bld: 7 % — ABNORMAL HIGH (ref 4.6–6.5)

## 2021-05-12 ENCOUNTER — Telehealth: Payer: Self-pay

## 2021-05-12 NOTE — Telephone Encounter (Signed)
Pt has coumadin clinic apt on 2/3 at 11am. Will need to RS this apt for the week before 2/3 at 11:30 or the week after, 2/17 at 11:30. LVM

## 2021-05-15 ENCOUNTER — Other Ambulatory Visit: Payer: Self-pay | Admitting: Primary Care

## 2021-05-15 DIAGNOSIS — J439 Emphysema, unspecified: Secondary | ICD-10-CM

## 2021-05-15 DIAGNOSIS — F339 Major depressive disorder, recurrent, unspecified: Secondary | ICD-10-CM

## 2021-05-20 ENCOUNTER — Ambulatory Visit: Payer: Commercial Managed Care - HMO

## 2021-05-20 ENCOUNTER — Telehealth: Payer: Self-pay

## 2021-05-20 NOTE — Chronic Care Management (AMB) (Signed)
° ° °Chronic Care Management °Pharmacy Assistant  ° °Name: [name] Cook  MRN: [id number] DOB: 04/25/1950 ° ° °Reason for Encounter: Reminder Call °  °Conditions to be addressed/monitored: °HTN, HLD, COPD, DMII, and CKD Stage 2 ° ° °Medications: °Outpatient Encounter Medications as of 05/20/2021  °Medication Sig  ° albuterol (VENTOLIN HFA) 108 (90 Base) MCG/ACT inhaler INHALE 2 PUFFS BY MOUTH EVERY 4 HOURS AS NEEDED FOR WHEEZE OR FOR SHORTNESS OF BREATH  ° blood glucose meter kit and supplies KIT Dispense based on patient and insurance preference. Use up to four times daily as directed. (FOR ICD-9 250.00, 250.01).  ° budesonide-formoterol (SYMBICORT) 160-4.5 MCG/ACT inhaler INHALE 2 PUFFS INTO THE LUNGS TWICE A DAY  ° clopidogrel (PLAVIX) 75 MG tablet TAKE 1 TABLET BY MOUTH EVERY DAY  ° diclofenac Sodium (VOLTAREN) 1 % GEL Apply 2 g topically 4 (four) times daily.  ° DULoxetine (CYMBALTA) 60 MG capsule TAKE 1 CAPSULE (60 MG TOTAL) BY MOUTH DAILY. FOR DEPRESSION.  ° empagliflozin (JARDIANCE) 10 MG TABS tablet TAKE 1 TABLET BY MOUTH EVERY MORNING FOR DIABETES  ° fish oil-omega-3 fatty acids 1000 MG capsule Take 1 g by mouth daily.    ° fluticasone (FLONASE) 50 MCG/ACT nasal spray PLACE 1 SPRAY INTO BOTH NOSTRILS DAILY AS NEEDED FOR ALLERGIES OR RHINITIS.  ° furosemide (LASIX) 20 MG tablet TAKE 1 TABLET BY MOUTH EVERY DAY  ° gabapentin (NEURONTIN) 300 MG capsule Take 1 capsule (300 mg total) by mouth 2 (two) times daily. For neuropathy  ° glucose blood test strip 1 each by Other route as needed for other. Use to check blood sugar up to three times a day-Accu-Chek meter  ° isosorbide mononitrate (IMDUR) 60 MG 24 hr tablet TAKE 1 TABLET BY MOUTH EVERY DAY  ° lisinopril (ZESTRIL) 2.5 MG tablet TAKE 1 TABLET BY MOUTH EVERY DAY  ° metFORMIN (GLUCOPHAGE-XR) 500 MG 24 hr tablet Take 1 tablet (500 mg total) by mouth 2 (two) times daily with a meal. For diabetes.  ° metoprolol succinate (TOPROL-XL) 25 MG 24 hr tablet Take 0.5  tablets (12.5 mg total) by mouth daily.  ° nitroGLYCERIN (NITROSTAT) 0.4 MG SL tablet Place 1 tablet (0.4 mg total) under the tongue every 5 (five) minutes as needed for chest pain.  ° pantoprazole (PROTONIX) 40 MG tablet TAKE 1 TABLET (40 MG TOTAL) BY MOUTH DAILY FOR HEARTBURN.  ° rosuvastatin (CRESTOR) 20 MG tablet TAKE 1 TABLET BY MOUTH EVERY DAY IN THE EVENING FOR CHOLESTEROL  ° sitaGLIPtin (JANUVIA) 100 MG tablet TAKE 1 TABLET BY MOUTH ONCE DAILY FOR DIABETES  ° tamsulosin (FLOMAX) 0.4 MG CAPS capsule TAKE 1 CAPSULE (0.4 MG TOTAL) BY MOUTH 2 (TWO) TIMES DAILY.  ° warfarin (COUMADIN) 5 MG tablet TAKE 1/2 TABLET DAILY BY MOUTH EXCEPT TAKE 1 TABLET ON MONDAYS AND THURSDAYS OR AS DIRECTED BY ANTICOAGULATION CLINIC  ° °No facility-administered encounter medications on file as of 05/20/2021.  ° ° °Caisen F Perine was contacted to remind of upcoming telephone visit with Lindsey Foltanski on 05/25/21 at 1:00pm. Patient was reminded to have all medications, supplements and any blood glucose and blood pressure readings available for review at appointment. If unable to reach, a voicemail was left for patient.  ° ° °Are you having any problems with your medications? Yes  The patient reports he tried to refill his Januvia with [facility] and they told him it was too early, we show 90ds due 05/21/21 ° °Do you have any concerns you like to   to discuss with the pharmacist? No    Star Rating Drugs: Medication:  Last Fill: Day Supply Jardiance 62m 05/16/21 90 Lisinopril 2.511m12/31/22 90 Metformin 500XR 04/11/21 90 Rosuvastatin 2053m1/18/22 90 Januvia 100m21m/4/22 90  [place] notified  VelmAvel SensorMAPalmhurstistant [phone] time spent for month CPA: 10 min

## 2021-05-25 ENCOUNTER — Telehealth: Payer: Medicare Other

## 2021-05-25 NOTE — Progress Notes (Deleted)
Chronic Care Management Pharmacy Note  12/21/20 Name:  [name] Cook MRN:  [id number] DOB:  06/12/1950  Summary: Patient reports depressed mood. Consulted PCP, she would like patient to schedule visit. He was notified. Diarrhea improving off stool softener. Patient confirms understanding of how to use maintenance inhaler for rescue for COPD. No cost or adherence concerns today.  Recommendations/Changes made from today's visit: None  Plan: --[place] will call patient *** -Pharmacist follow up televisit scheduled for *** -PCP 86-monthf/u + CPE 10/14/21   Subjective: [name] GILARDIis an 71y.o. year old male who is a primary patient of CPleas Koch NP.  The CCM team was consulted for assistance with disease management and care coordination needs.    Engaged with patient by telephone for follow up visit in response to provider referral for pharmacy case management and/or care coordination services.   Consent to Services:  The patient was given information about Chronic Care Management services, agreed to services, and gave verbal consent prior to initiation of services.  Please see initial visit note for detailed documentation.   Patient Care Team: CPleas Koch NP as PCP - General (Internal Medicine) TEvans Lance MD as PCP - Cardiology (Cardiology) TEvans Lance MD as PCP - Electrophysiology (Cardiology) Rothbart, RCristopher Estimable MD (Inactive) (Cardiology) CNoreene Filbert MD as Radiation Oncologist (Radiation Oncology) ADebbora Dus RThe Surgery Center Of Aiken LLCas Pharmacist (Pharmacist)  Recent office visits: 04/08/21 NP KAlma FriendlyOV: f/u DM, depression. Pt did not increase metformin as instructed last visit. A1c 7.0. No med changes. F/u 6 months.  03/23/21 NP KAlma FriendlyOV: f/u fatigue, depression. Increase duloxetine to 60 mg daily. Increase metformin to 2 tab AM or PM.  03/07/21 NP JKarl ItoOV: acute bronchitis. Rx'd Augmentin and prednisone. 03/02/21 NP JKarl ItoVV: c/o acute cough. Negative flu and covid tests. Rx'd guafenesin-codeine syrup.  02/02/21 NP KAlma FriendlyOV: c/o diarrhea. Reduce metformin to 500 mg BID.   12/30/20 NP KAlma FriendlyOV: f/u DM. Stop citalopram and start duloxetine 30 mg daily. Change metformin to ER 500 mg and increase to 2 tab BID.   11/22/20 - Family Medicine - Patient presented for follow up abdomen pain. Improving on current regimen. Advised to finish taking the antibiotic as prescribed earlier. Follow up if symptoms do not improve.  Recent consult visits:  04/29/21 NP SEric Form(Pulmonary): hx tobacco use 02/15/21 PA SZara Council(Urology): f/u ED, prostate cancer. Not a candidate for PDE-5 due to nitrate use. S/p EBRT and Lupron for Prostate Ca 2020.  11/23/20 - Cardiology - Patient presented for routine electrophysiology follow up. No medication changes. 06/15/20 - Urology - Patient presented for follow up prostate cancer. Currently on Flomax twice daily, may titrate off if his symptoms are well controlled.  Hospital visits: None in previous 6 months   Objective:  Lab Results  Component Value Date   CREATININE 1.18 03/23/2021   BUN 18 03/23/2021   GFR 63.25 03/23/2021   GFRNONAA >60 01/23/2020   GFRAA 72 06/23/2019   NA 135 03/23/2021   K 3.9 03/23/2021   CALCIUM 10.0 03/23/2021   CO2 27 03/23/2021   GLUCOSE 308 (H) 03/23/2021    Lab Results  Component Value Date/Time   HGBA1C 7.0 (H) 05/10/2021 09:50 AM   HGBA1C 6.5 (A) 01/31/2021 09:45 AM   HGBA1C 7.6 (H) 10/07/2020 10:18 AM   HGBA1C 6.4 (H) 11/27/2016 11:33 AM   HGBA1C 7.1 (H) 08/21/2016 10:25 AM  GFR 63.25 03/23/2021 02:59 PM   GFR 68.24 11/16/2020 12:30 PM   MICROALBUR <0.7 09/21/2017 01:06 PM   MICROALBUR <0.2 11/27/2016 11:33 AM    Last diabetic Eye exam:  Lab Results  Component Value Date/Time   HMDIABEYEEXA No Retinopathy 08/28/2020 12:00 AM    Last diabetic Foot exam: 09/2019 PCP  Lab Results  Component Value Date    CHOL 131 10/07/2020   HDL 43.90 10/07/2020   LDLCALC 55 10/07/2020   TRIG 161.0 (H) 10/07/2020   CHOLHDL 3 10/07/2020    Hepatic Function Latest Ref Rng & Units 11/16/2020 10/07/2020 01/23/2020  Total Protein 6.0 - 8.3 g/dL 6.6 6.5 7.2  Albumin 3.5 - 5.2 g/dL 4.2 4.3 4.3  AST 0 - 37 U/L '21 21 20  ' ALT 0 - 53 U/L '20 20 19  ' Alk Phosphatase 39 - 117 U/L 44 51 55  Total Bilirubin 0.2 - 1.2 mg/dL 0.6 0.6 0.9  Bilirubin, Direct 0.00 - 0.40 mg/dL - - -    Lab Results  Component Value Date/Time   TSH 2.030 04/15/2019 10:05 AM   TSH 1.93 10/27/2015 10:54 AM   FREET4 1.38 03/14/2013 11:12 AM   FREET4 1.11 05/10/2010 10:02 AM    CBC Latest Ref Rng & Units 03/23/2021 11/16/2020 10/07/2020  WBC 4.0 - 10.5 K/uL 6.6 4.7 4.9  Hemoglobin 13.0 - 17.0 g/dL 14.6 14.6 15.3  Hematocrit 39.0 - 52.0 % 43.6 43.9 44.8  Platelets 150.0 - 400.0 K/uL 194.0 118.0(L) 109.0 Repeated and verified X2.(L)    Lab Results  Component Value Date/Time   VD25OH 32 10/27/2015 10:54 AM    Clinical ASCVD: Yes  The ASCVD Risk score (Arnett DK, et al., 2019) failed to calculate for the following reasons:   The patient has a prior MI or stroke diagnosis    Depression screen [facility] 2/9 04/08/2021 02/02/2021 12/30/2020  Decreased Interest 0 0 2  Down, Depressed, Hopeless 0 0 0  PHQ - 2 Score 0 0 2  Altered sleeping 0 0 3  Tired, decreased energy 2 0 3  Change in appetite 0 0 0  Feeling bad or failure about yourself  0 0 0  Trouble concentrating 0 0 0  Moving slowly or fidgety/restless 0 0 0  Suicidal thoughts 0 0 0  PHQ-9 Score 2 0 8  Difficult doing work/chores Not difficult at all Not difficult at all Not difficult at all  Some recent data might be hidden     Social History   Tobacco Use  Smoking Status Former   Packs/day: 1.00   Years: 42.00   Pack years: 42.00   Types: Cigarettes   Quit date: 12/30/2013   Years since quitting: 7.4  Smokeless Tobacco Never   BP Readings from Last 3 Encounters:  04/08/21  104/64  03/23/21 100/60  03/07/21 122/64   Pulse Readings from Last 3 Encounters:  04/08/21 84  03/23/21 62  03/07/21 82   Wt Readings from Last 3 Encounters:  04/08/21 222 lb (100.7 kg)  03/23/21 215 lb (97.5 kg)  03/07/21 214 lb 4 oz (97.2 kg)   BMI Readings from Last 3 Encounters:  04/08/21 30.96 kg/m  03/23/21 29.99 kg/m  03/07/21 29.88 kg/m    Assessment/Interventions: Review of patient past medical history, allergies, medications, health status, including review of consultants reports, laboratory and other test data, was performed as part of comprehensive evaluation and provision of chronic care management services.   SDOH:  (Social Determinants of Health) assessments and interventions performed:  Yes   SDOH Screenings   Alcohol Screen: Low Risk    Last Alcohol Screening Score (AUDIT): 0  Depression (PHQ2-9): Low Risk    PHQ-2 Score: 2  Financial Resource Strain: Low Risk    Difficulty of Paying Living Expenses: Not very hard  Food Insecurity: No Food Insecurity   Worried About Charity fundraiser in the Last Year: Never true   Ran Out of Food in the Last Year: Never true  Housing: Low Risk    Last Housing Risk Score: 0  Physical Activity: Inactive   Days of Exercise per Week: 0 days   Minutes of Exercise per Session: 0 min  Social Connections: Not on file  Stress: No Stress Concern Present   Feeling of Stress : Not at all  Tobacco Use: Medium Risk   Smoking Tobacco Use: Former   Smokeless Tobacco Use: Never   Passive Exposure: Not on file  Transportation Needs: No Transportation Needs   Lack of Transportation (Medical): No   Lack of Transportation (Non-Medical): No    CCM Care Plan  No Known Allergies  Medications Reviewed Today     Reviewed by Magdalen Spatz, NP (Nurse Practitioner) on 04/29/21 at 1145  Med List Status: <None>   Medication Order Taking? Sig Documenting Provider Last Dose Status Informant  albuterol (VENTOLIN HFA) 108 (90 Base)  MCG/ACT inhaler [id number] No INHALE 2 PUFFS BY MOUTH EVERY 4 HOURS AS NEEDED FOR WHEEZE OR FOR SHORTNESS OF Nicholes Stairs, NP Taking Active   blood glucose meter kit and supplies KIT [id number]  Dispense based on patient and insurance preference. Use up to four times daily as directed. (FOR ICD-9 250.00, 250.01). [name], NP  Active   budesonide-formoterol [facility]) 160-4.5 MCG/ACT inhaler [id number] No INHALE 2 PUFFS INTO THE LUNGS TWICE A DAY [name], NP Taking Active   clopidogrel (PLAVIX) 75 MG tablet [id number] No TAKE 1 TABLET BY MOUTH EVERY DAY [name], MD Taking Active   diclofenac Sodium (VOLTAREN) 1 % GEL [id number] No Apply 2 g topically 4 (four) times daily. Delia Heady, PA-C Taking Active   DULoxetine (CYMBALTA) 60 MG capsule [id number] No Take 1 capsule (60 mg total) by mouth daily. For depression. [name], NP Taking Active   empagliflozin (JARDIANCE) 10 MG TABS tablet [id number] No TAKE 1 TABLET BY MOUTH EVERY MORNING FOR DIABETES [name], NP Taking Active   fish oil-omega-3 fatty acids 1000 MG capsule [id number] No Take 1 g by mouth daily.   [provider] Taking Active Self  fluticasone (FLONASE) 50 MCG/ACT nasal spray [id number] No PLACE 1 SPRAY INTO BOTH NOSTRILS DAILY AS NEEDED FOR ALLERGIES OR RHINITIS. Alycia Rossetti, MD Taking Active   furosemide (LASIX) 20 MG tablet [id number] No TAKE 1 TABLET BY MOUTH EVERY DAY [name], MD Taking Active   gabapentin (NEURONTIN) 300 MG capsule [id number] No Take 1 capsule (300 mg total) by mouth 2 (two) times daily. For neuropathy [name], NP Taking Active   glucose blood test strip [id number]  1 each by Other route as needed for other. Use to check blood sugar up to three times a day-Accu-Chek meter [provider]  Active   isosorbide mononitrate (IMDUR) 60 MG 24 hr tablet [id number] No TAKE 1 TABLET BY MOUTH EVERY DAY [name], MD Taking Active    lisinopril (ZESTRIL) 2.5 MG tablet [id number] No TAKE 1 TABLET BY MOUTH EVERY DAY [name],  MD Taking Active   metFORMIN (GLUCOPHAGE-XR) 500 MG 24 hr tablet [id number] No Take 1 tablet (500 mg total) by mouth 2 (two) times daily with a meal. For diabetes. [name], NP Taking Active   metoprolol succinate (TOPROL-XL) 25 MG 24 hr tablet [id number] No Take 0.5 tablets (12.5 mg total) by mouth daily. Baldwin [place], PA-C Taking Active   nitroGLYCERIN (NITROSTAT) 0.4 MG SL tablet [id number]  Place 1 tablet (0.4 mg total) under the tongue every 5 (five) minutes as needed for chest pain. [name], NP  Active   pantoprazole (PROTONIX) 40 MG tablet [id number] No TAKE 1 TABLET (40 MG TOTAL) BY MOUTH DAILY FOR HEARTBURN. [name], NP Taking Active   rosuvastatin (CRESTOR) 20 MG tablet [id number] No TAKE 1 TABLET BY MOUTH EVERY DAY IN THE EVENING FOR CHOLESTEROL [name], NP Taking Active   sitaGLIPtin (JANUVIA) 100 MG tablet [id number] No TAKE 1 TABLET BY MOUTH ONCE DAILY FOR DIABETES [name], NP Taking Active   tamsulosin (FLOMAX) 0.4 MG CAPS capsule [id number] No TAKE 1 CAPSULE (0.4 MG TOTAL) BY MOUTH 2 (TWO) TIMES DAILY. Noreene Filbert, MD Taking Active   warfarin (COUMADIN) 5 MG tablet [id number]  TAKE 1/2 TABLET DAILY BY MOUTH EXCEPT TAKE 1 TABLET ON MONDAYS AND THURSDAYS OR AS DIRECTED BY ANTICOAGULATION CLINIC [name], NP  Active             Patient Active Problem List   Diagnosis Date Noted   Acute bronchitis with COPD ([place]) 03/07/2021   Acute cough 03/02/2021   Body aches 03/02/2021   Fever 03/02/2021   Chest wall pain 11/22/2020   Left lower quadrant abdominal pain 10/26/2020   History of diverticulitis 02/06/2020   Preventative health care 10/09/2019   Ear canal dryness, bilateral 08/12/2019   Polyp of colon    Decreased renal function 06/27/2018   Prostate cancer ([place]) 06/10/2018   Chest pain 06/04/2018   OSA  (obstructive sleep apnea) 10/01/2017   Encounter for screening colonoscopy 10/01/2017   Osteoarthritis 06/26/2017   Insomnia 10/01/2014   Diarrhea 04/13/2014   Depression 03/09/2014   Coronary artery disease 11/06/2013   Long term current use of anticoagulant therapy 06/19/2013   Pacemaker 04/30/2013   Acute myocardial infarction, subendocardial infarction, initial episode of care ([place]) 03/15/2013   Abnormal LFTs 05/02/2012   Chronic kidney disease, 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/26/2011   Chronic obstructive pulmonary disease ([place]) 04/20/2011   Atrial fibrillation    Hypertension    Hyperlipidemia    Diabetes mellitus type 2, uncomplicated ([place])    Atrial flutter ([place])    GASTROESOPHAGEAL REFLUX DISEASE 05/16/2010    Immunization History  Administered Date(s) Administered   Fluad Quad(high Dose 65+) 02/02/2021   Influenza, High Dose Seasonal PF 02/20/2018, 02/03/2019   Influenza,inj,Quad PF,6+ Mos 02/05/2014, 01/04/2015, 04/20/2016   Influenza-Unspecified 01/05/2011, 01/15/2013, 01/30/2020   PFIZER(Purple Top)SARS-COV-2 Vaccination 09/17/2019, 10/08/2019   Pneumococcal Conjugate-13 09/21/2017   Pneumococcal Polysaccharide-23 01/05/2011   Tdap 01/05/2011    Conditions to be addressed/monitored:  Diabetes, COPD, and Depression  There are no care plans that you recently modified to display for this patient.     Medication Assistance: None required.  Patient affirms current coverage meets needs. (Medicare/Medicaid)  Compliance/Adherence/Medication fill history: Care Gaps: None  Star-Rating Drugs: Rosuvastatin - LF 03/04/21 x 90 ds; PDC 100% Januvia - LF 02/18/21 x 90 ds; PDC 100% Metformin - LF 04/11/21 x 90 ds; PDC 100% Jardiance - LF 05/16/21  x 90 ds; PDC 100% Lisinopril - LF 04/16/21 x 90 ds; [place] 99%  Patient's preferred pharmacy is:  [id number] [place] [address] [place]Phone: [phone]:  [phone] [id number] [place] [address] [place]Phone: [phone]: [phone]  Uses pill box? Yes, girlfriend fills his pillbox weekly Pt endorses 100% compliance  We discussed: Benefits of medication synchronization, packaging and delivery as well as enhanced pharmacist oversight with [employer]. Patient decided to: Continue current medication management strategy -He would like to consider pill packs.  Care Plan and Follow Up Patient Decision:  Patient agrees to Care Plan and Follow-up.  Follow Up Plan: {CM FOLLOW UP PRXVQ:00867} LCharlene Brooke PharmD, BCACP Clinical Pharmacist [phone]  Current Barriers:  {pharmacybarriers:24917}  Pharmacist Clinical Goal(s):  Patient will {PHARMACYGOALCHOICES:24921} through collaboration with PharmD and provider.   Interventions: 1:1 collaboration with CPleas Koch NP regarding development and update of comprehensive plan of care as evidenced by provider attestation and co-signature Inter-disciplinary care team collaboration (see longitudinal plan of care) Comprehensive medication review performed; medication list updated in electronic medical record  Hypertension / Heart Failure (BP goal <130/80) -Controlled - per clinic readings -Hx OSA, unable to tolerate CPAP -Per cardiology note, pt has CHF (combined) as well. He is on Lasix. -LVEF 55-60% (11/25/20); mild Left ventricular hypertrophy -Current treatment: Lisinopril 2.5 mg - 1 tablet daily Metoprolol succinate 25 mg - 1 /2 tablet daily Furosemide 20 mg - 1 daily  Isosorbide Mononitrate 60 mg - 1 tablet daily -Medications previously tried: none reported  -Current home readings: ***, he checks weight, BP and oxygen 3 days a week [facility]-Educated on BP goals and benefits of  medications for prevention of heart attack, stroke and kidney damage; -Counseled to monitor BP at home as instructed 2 days per week, -Recommended to continue current medication; Verify you are taking furosemide daily.    Atrial Fibrillation (Goal: prevent stroke and major bleeding) -Controlled - per patient report no symptoms, INR stable -Followed by cardiology, INR per [facility] -CHADSVASC: 5 -Current treatment: Rate control: Metoprolol succinate 25 mg -1 /2 tablet daily Anticoagulation: Wafarin 5 mg - as directed -Medications previously tried: none reported - no DOAC history due to cost concern -Home BP and HR readings: see HTN section -Counseled on importance of adherence to anticoagulant exactly as prescribed; avoidance of NSAIDs due to increased bleeding risk with anticoagulants; -Recommended to continue current medication  Hyperlipidemia/CAD: (LDL goal < 70) -Controlled - LDL 55 -CAD, stent Feb 2020, followed by cardiology -Pt denies any chest pain or abnormal symptoms. Affirms daily adherence.  -Current treatment: Rosuvastatin 20 mg - 1 tablet daily Clopidogrel 75 mg - 1 tablet daily Isosorbide Mononitrate 60 mg - 1 tablet daily Nitroglycerin - PRN  Fish Oil 1000 mg - daily -Medications previously tried: none reported  -Educated on Cholesterol goals;  -Recommended to continue current medication  Diabetes (A1c goal <7%) -{US controlled/uncontrolled:25276} -  -Current home glucose readings - checks fasting 3 days per week fasting glucose: none  Post prandial: 220 after lunch yesterday -Current medications: Januvia 100 mg - 1 tablet daly Jardiance 10 mg - 1 tablet daily Metformin 500 mg - 2 tablet in morning and 1 evening  (dose increased 10/09/20) -Medications previously tried: none reported  -  Denies hypoglycemic/hyperglycemic symptoms -Recommend continue current medications; Keep record of daily glucose readings.  COPD/OSA (Goal: control symptoms and prevent  exacerbations) -Not ideally controlled - per patient report, symptoms could improve. -Pt referred to pulm 6/19 for sleep study. Unclear if patient followed up on referral. -No active tobacco use. Quit 2013. Low dose CT 05/06/21 normal.  -No PFTs found. No CAT score documented. -Current treatment  Symbicort 160-4.5 mcg/act - 2 puff BID (takes PRN) Albuterol PRN  -Medications previously tried: none reported -Exacerbations requiring treatment in last 6 months: none -Patient denies consistent use of maintenance inhaler. He uses Symbicort PRN -Frequency of rescue inhaler use: ~ 1-2 x/week -Counseled on Differences between maintenance and rescue inhalers -Recommended to continue current medication for now. Encouraged to use Symbicort daily if symptoms not well controlled.  Depression/Insomnia (Goal: Improve mood, sleep) -{US controlled/uncontrolled:25276} -Caffeine: 2 cups coffee, drinks coke zero - 2 cans/day. No alcohol or tobacco use -PHQ9= 2 (03/2021) - no/minimal depression -Current treatment: Duloxetine 60 mg daily -Medications previously tried/failed: Lexapro 10 mg  and Bupropion 150 mg BID, citalopram -{CCMPHARMDINTERVENTION:25122}  Health Maintenance -Vaccine gaps: Prevnar20 or PPSV23, TDAP -Hx prostate cancer, x/p radiation and Lupron early 2020 -Current therapy:  Flonase Voltaren gel -{CCM Patient satisfied:25129} -{CCMPHARMDINTERVENTION:25122}  Patient Goals/Self-Care Activities Patient will:  - take medications as prescribed check glucose 3 days per week, document, and provide at future appointments check blood pressure 3 days per week, document, and provide at future appointments

## 2021-05-25 NOTE — Telephone Encounter (Signed)
Office is calling back because they have to move the procedure to March but date is not determine yet. Please advise

## 2021-05-25 NOTE — Telephone Encounter (Signed)
° °  Patient Name: [name] Cook  DOB: [birth year] MRN: [id number]  Primary Cardiologist: [name], MD  Chart reviewed as part of pre-operative protocol coverage.   The patient has an upcoming 6 month f/u appointment scheduled 06/02/21 with Dr. [name] at which time this clearance can be addressed in case there are any issues found that would impact surgical recommendations, since surgery is not planned until 06/2021.  I added preop FYI to appointment notes so that provider is aware to address at time of OV.   Per office protocol, the cardiology provider or covering nurse should forward their finalized clearance decision and recommendations regarding antiplatelet/anticoagulation to requesting party below. I will route this message as FYI to requesting party and remove this message from the pre-op box as separate preop APP input not needed at this time.  Please call with questions.  Charlie Pitter, PA-C 05/25/2021, 4:05 PM

## 2021-05-25 NOTE — Telephone Encounter (Signed)
°  Chronic Care Management   Outreach Note  05/25/2021 Name: [name] Cook MRN: [id number] DOB: [birth year]  Referred by: [name], NP  Patient had a phone appointment scheduled with clinical pharmacist today.  An unsuccessful telephone outreach was attempted today. The patient was referred to the pharmacist for assistance with medications, care management and care coordination.   Patient will NOT be penalized in any way for missing a CCM appointment. The no-show fee does not apply.  If possible, a message was left to return call to: [phone] or to [facility].  Charlene Brooke, PharmD, BCACP Clinical Pharmacist [facility] [phone]

## 2021-05-26 ENCOUNTER — Other Ambulatory Visit: Payer: Self-pay | Admitting: Urology

## 2021-05-27 ENCOUNTER — Ambulatory Visit (INDEPENDENT_AMBULATORY_CARE_PROVIDER_SITE_OTHER): Payer: Medicare Other | Admitting: Primary Care

## 2021-05-27 ENCOUNTER — Ambulatory Visit (INDEPENDENT_AMBULATORY_CARE_PROVIDER_SITE_OTHER): Payer: Medicare Other

## 2021-05-27 ENCOUNTER — Other Ambulatory Visit: Payer: Self-pay

## 2021-05-27 VITALS — BP 126/64 | HR 90 | Temp 97.7°F | Ht 71.0 in | Wt 212.0 lb

## 2021-05-27 DIAGNOSIS — R197 Diarrhea, unspecified: Secondary | ICD-10-CM | POA: Diagnosis not present

## 2021-05-27 DIAGNOSIS — Z1211 Encounter for screening for malignant neoplasm of colon: Secondary | ICD-10-CM

## 2021-05-27 DIAGNOSIS — Z7901 Long term (current) use of anticoagulants: Secondary | ICD-10-CM

## 2021-05-27 DIAGNOSIS — R1084 Generalized abdominal pain: Secondary | ICD-10-CM | POA: Diagnosis not present

## 2021-05-27 DIAGNOSIS — Z87891 Personal history of nicotine dependence: Secondary | ICD-10-CM

## 2021-05-27 LAB — POCT INR: INR: 2.2 (ref 2.0–3.0)

## 2021-05-27 NOTE — Assessment & Plan Note (Signed)
Returned.   Differentials include IBS, side effects from metformin, diverticulitis, gall bladder involvement.   Start by checking stool studies, CBC with diff, BMP. Hold all metformin XR 500 mg.  Referral placed to GI for colonoscopy as he is overdue.   Consider CT scan. Await labs.

## 2021-05-27 NOTE — Patient Instructions (Addendum)
Pre visit review using our clinic review tool, if applicable. No additional management support is needed unless otherwise documented below in the visit note.  Continue 2.5mg  daily except take 5mg  on Mondays and Thursdays. Recheck in 4 wks

## 2021-05-27 NOTE — Patient Instructions (Addendum)
Stop taking metformin for diabetes.   Complete the stool sample kit and return it as soon as possible.  Stop by the lab prior to leaving today.   Avoid spicy, fried, fatty foods. Avoid caffeine.   You will be contacted regarding your referral to GI for the colonoscopy.  Please let us know if you have not been contacted within two weeks.   I will be in touch again soon!

## 2021-05-27 NOTE — Progress Notes (Signed)
Continue 2.5mg  daily except take 5mg  on Mondays and Thursdays. Recheck in 4 wks

## 2021-05-27 NOTE — Progress Notes (Signed)
Subjective:    Patient ID: [name] Cook, male    DOB: 04/23/1950, 71 y.o.   MRN: [id number]  HPI  [name] Cook is a very pleasant 71 y.o. male with a history of hypertension, atrial fibrillation, CAD, COPD, OSA, GERD, type 2 diabetes, CKD, prostate cancer, pacemaker in place, hyperlipidemia, diarrhea, diverticulosis with acute diverticulitis who presents today to discuss diarrhea.   His diarrhea began about 1 month ago. He is experiencing watery stools every 2-3 days with 2-5 episodes per day, followed by 2-3 days of constipation without a movement. The diarrhea occurs just after eating larger meal, not with snacks. He notices intermittent mid abdominal pain just prior to his diarrhea that is relieved with diarrhea. He does have mild mid abdominal pain on days of constipation.   He was evaluated for diarrhea in October 2022, metformin XR was reduced to 500 mg BID. He was advised to update [place] two weeks later. He endorses his diarrhea in October 2022 resolved after the reduced dose of metformin.   His last colonoscopy was in August 2021, poor prep so he was advised to return 1 week later. He did not return. He is willing to have the colonoscopy done now.   He denies fevers, bloody stools, recent antibiotic use, recent travel, changes in medications, nausea, vomiting.   Wt Readings from Last 3 Encounters:  05/27/21 212 lb (96.2 kg)  04/08/21 222 lb (100.7 kg)  03/23/21 215 lb (97.5 kg)     Review of Systems  Constitutional:  Negative for chills and fever.  Gastrointestinal:  Positive for abdominal pain, constipation and diarrhea. Negative for blood in stool, nausea and vomiting.  Psychiatric/Behavioral:  The patient is not nervous/anxious.         Past Medical History:  Diagnosis Date   Arthritis    "knees and lower back" (03/14/2013)   Atrial flutter ([place])    radiofrequency ablation in 2001   CAD (coronary artery disease)    a. Nonobstructive. Cardiac cath in 2001-50% mid RI,  normal LM, LAD, RCA b. cath 10/16/2014 95% mid RCA treated with DES, 99% ost D1 medical management due to small aneurysmal segment   Chronic anticoagulation    chronic Coumadin anticoagulation   Chronic obstructive pulmonary disease ([place]) 04/20/2011   Diabetes mellitus, type 2 (HCC)    GERD (gastroesophageal reflux disease)    Hyperlipidemia    Hypertension    with hypertensive heart disease   Left knee pain 10/25/2017   medial   Obesity    Persistent atrial fibrillation ([place])    recurrent atrial flutter since 2001 s/p DCCVs, multiple failed AADs, h/o tachy-mediated cardiomyopathy   Shortness of breath    "can come on at any time" (03/14/2013)   Sleep apnea    "dx'd; couldn't wear the mask" (03/14/2013)   Tobacco abuse     Social History   Socioeconomic History   Marital status: Widowed    Spouse name: Not on file   Number of children: 1   Years of education: Not on file   Highest education level: Not on file  Occupational History   Occupation: Unemployed    Employer: UNEMPLOYED  Tobacco Use   Smoking status: Former    Packs/day: 1.00    Years: 42.00    Pack years: 42.00    Types: Cigarettes    Quit date: 12/30/2013    Years since quitting: 7.4   Smokeless tobacco: Never  Vaping Use   Vaping Use: Never used  Substance and Sexual Activity   Alcohol use: Not Currently    Comment: 03/14/2013 "stopped drinking back in 2002; never had problem w/it"   Drug use: No   Sexual activity: Not Currently  Other Topics Concern   Not on file  Social History Narrative   Single.   Retired.    1 son, deceased.    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   Enjoys playing on the computer.       Social Determinants of Health   Financial Resource Strain: Low Risk    Difficulty of Paying Living Expenses: Not very hard  Food Insecurity: No Food Insecurity   Worried About Charity fundraiser in the Last Year: Never true   Ran Out of Food in the  Last Year: Never true  Transportation Needs: No Transportation Needs   Lack of Transportation (Medical): No   Lack of Transportation (Non-Medical): No  Physical Activity: Inactive   Days of Exercise per Week: 0 days   Minutes of Exercise per Session: 0 min  Stress: No Stress Concern Present   Feeling of Stress : Not at all  Social Connections: Not on file  Intimate Partner Violence: Not At Risk   Fear of Current or Ex-Partner: No   Emotionally Abused: No   Physically Abused: No   Sexually Abused: No    Past Surgical History:  Procedure Laterality Date   ATRIAL FLUTTER ABLATION  2002   atrial flutter; subsequently developed atrial fibrillation   AV NODE ABLATION  01/24/2013   CARDIAC CATHETERIZATION  2002   CARDIAC CATHETERIZATION N/A 10/16/2014   Procedure: Left Heart Cath and Coronary Angiography;  Surgeon: [name], MD;  Location: [place];  Service: Cardiovascular;  Laterality: N/A;   CARDIOVERSION  05/31/2011   Procedure: CARDIOVERSION;  Surgeon: Cristopher Estimable. Lattie Haw, MD;  Location: [facility];  Service: Cardiovascular;  Laterality: N/A;   CARPAL TUNNEL RELEASE Left 1980's   COLONOSCOPY WITH PROPOFOL N/A 12/30/2018   Procedure: COLONOSCOPY WITH PROPOFOL;  Surgeon: Jonathon Bellows, MD;  Location: [facility] ENDOSCOPY;  Service: Gastroenterology;  Laterality: N/A;   COLONOSCOPY WITH PROPOFOL N/A 12/04/2019   Procedure: COLONOSCOPY WITH PROPOFOL;  Surgeon: Jonathon Bellows, MD;  Location: [facility] ENDOSCOPY;  Service: Gastroenterology;  Laterality: N/A;   INSERT / REPLACE / REMOVE PACEMAKER  01/24/2013    Medtronic Adapta L dual-chamber pacemaker, serial number [id number]    LEFT HEART CATH AND CORONARY ANGIOGRAPHY N/A 06/07/2018   Procedure: LEFT HEART CATH AND CORONARY ANGIOGRAPHY;  Surgeon: [name], MD;  Location: [place] [facility];  Service: Cardiovascular;  Laterality: N/A;   LEFT HEART CATHETERIZATION WITH CORONARY ANGIOGRAM N/A 03/17/2013   Procedure: LEFT HEART CATHETERIZATION  WITH CORONARY ANGIOGRAM;  Surgeon: Burnell Blanks, MD; LAD mild dz, D1 branch 100%, inferior branch 99%, CFX OK, RCA 50%, EF 65%     LOOP RECORDER IMPLANT  2002   PERMANENT PACEMAKER INSERTION N/A 01/24/2013   Procedure: PERMANENT PACEMAKER INSERTION;  Surgeon: [name], MD;  Location: [facility] CATH LAB;  Service: Cardiovascular;  Laterality: N/A;   TIBIAL TUBERCLERPLASTY  ~ 2003    Family History  Problem Relation Age of Onset   Alzheimer's disease Mother    Osteoporosis Mother     No Known Allergies  Current Outpatient Medications on File Prior to Visit  Medication Sig Dispense Refill   albuterol (VENTOLIN HFA) 108 (90 Base) MCG/ACT inhaler INHALE 2 PUFFS BY MOUTH EVERY 4 HOURS AS  NEEDED FOR WHEEZE OR FOR SHORTNESS OF BREATH 6.7 each 0   blood glucose meter kit and supplies KIT Dispense based on patient and insurance preference. Use up to four times daily as directed. (FOR ICD-9 250.00, 250.01). 1 each 0   budesonide-formoterol (SYMBICORT) 160-4.5 MCG/ACT inhaler INHALE 2 PUFFS INTO THE LUNGS TWICE A DAY 10.2 each 5   clopidogrel (PLAVIX) 75 MG tablet TAKE 1 TABLET BY MOUTH EVERY DAY 90 tablet 3   diclofenac Sodium (VOLTAREN) 1 % GEL Apply 2 g topically 4 (four) times daily. 50 g 0   DULoxetine (CYMBALTA) 60 MG capsule TAKE 1 CAPSULE (60 MG TOTAL) BY MOUTH DAILY. FOR DEPRESSION. 90 capsule 1   empagliflozin (JARDIANCE) 10 MG TABS tablet TAKE 1 TABLET BY MOUTH EVERY MORNING FOR DIABETES 90 tablet 3   fish oil-omega-3 fatty acids 1000 MG capsule Take 1 g by mouth daily.       fluticasone (FLONASE) 50 MCG/ACT nasal spray PLACE 1 SPRAY INTO BOTH NOSTRILS DAILY AS NEEDED FOR ALLERGIES OR RHINITIS. 48 mL 3   furosemide (LASIX) 20 MG tablet TAKE 1 TABLET BY MOUTH EVERY DAY 90 tablet 3   gabapentin (NEURONTIN) 300 MG capsule Take 1 capsule (300 mg total) by mouth 2 (two) times daily. For neuropathy 180 capsule 3   glucose blood test strip 1 each by Other route as needed for other. Use  to check blood sugar up to three times a day-Accu-Chek meter     isosorbide mononitrate (IMDUR) 60 MG 24 hr tablet TAKE 1 TABLET BY MOUTH EVERY DAY 90 tablet 3   lisinopril (ZESTRIL) 2.5 MG tablet TAKE 1 TABLET BY MOUTH EVERY DAY 90 tablet 3   metFORMIN (GLUCOPHAGE-XR) 500 MG 24 hr tablet Take 1 tablet (500 mg total) by mouth 2 (two) times daily with a meal. For diabetes. 180 tablet 1   metoprolol succinate (TOPROL-XL) 25 MG 24 hr tablet Take 0.5 tablets (12.5 mg total) by mouth daily. 45 tablet 3   nitroGLYCERIN (NITROSTAT) 0.4 MG SL tablet Place 1 tablet (0.4 mg total) under the tongue every 5 (five) minutes as needed for chest pain. 25 tablet 0   pantoprazole (PROTONIX) 40 MG tablet TAKE 1 TABLET (40 MG TOTAL) BY MOUTH DAILY FOR HEARTBURN. 90 tablet 3   rosuvastatin (CRESTOR) 20 MG tablet TAKE 1 TABLET BY MOUTH EVERY DAY IN THE EVENING FOR CHOLESTEROL 90 tablet 1   sitaGLIPtin (JANUVIA) 100 MG tablet TAKE 1 TABLET BY MOUTH ONCE DAILY FOR DIABETES 90 tablet 1   tamsulosin (FLOMAX) 0.4 MG CAPS capsule TAKE 1 CAPSULE (0.4 MG TOTAL) BY MOUTH 2 (TWO) TIMES DAILY. 180 capsule 3   warfarin (COUMADIN) 5 MG tablet TAKE 1/2 TABLET DAILY BY MOUTH EXCEPT TAKE 1 TABLET ON MONDAYS AND THURSDAYS OR AS DIRECTED BY ANTICOAGULATION CLINIC 90 tablet 0   No current facility-administered medications on file prior to visit.    BP 126/64    Pulse 90    Temp 97.7 F (36.5 C) (Temporal)    Ht '5\' 11"'  (1.803 m)    Wt 212 lb (96.2 kg)    SpO2 94%    BMI 29.57 kg/m  Objective:   Physical Exam Constitutional:      General: He is not in acute distress.    Appearance: He is not ill-appearing.  Cardiovascular:     Rate and Rhythm: Normal rate and regular rhythm.  Pulmonary:     Effort: Pulmonary effort is normal.     Breath sounds: Normal  breath sounds. No wheezing or rales.  Abdominal:     Tenderness: There is abdominal tenderness in the right lower quadrant, left upper quadrant and left lower quadrant. There is no  guarding.  Musculoskeletal:     Cervical back: Neck supple.  Skin:    General: Skin is warm and dry.  Neurological:     Mental Status: He is alert and oriented to person, place, and time.          Assessment & Plan:      This visit occurred during the SARS-CoV-2 public health emergency.  Safety protocols were in place, including screening questions prior to the visit, additional usage of staff PPE, and extensive cleaning of exam room while observing appropriate contact time as indicated for disinfecting solutions.

## 2021-05-27 NOTE — Addendum Note (Signed)
Addended by: Ellamae Sia on: 05/27/2021 03:23 PM   Modules accepted: Orders

## 2021-05-28 LAB — BASIC METABOLIC PANEL
BUN: 21 mg/dL (ref 7–25)
CO2: 23 mmol/L (ref 20–32)
Calcium: 9.2 mg/dL (ref 8.6–10.3)
Chloride: 106 mmol/L (ref 98–110)
Creat: 1.06 mg/dL (ref 0.70–1.35)
Glucose, Bld: 104 mg/dL — ABNORMAL HIGH (ref 65–99)
Potassium: 4.3 mmol/L (ref 3.5–5.3)
Sodium: 139 mmol/L (ref 135–146)

## 2021-05-28 LAB — CBC WITH DIFFERENTIAL/PLATELET
Absolute Monocytes: 559 cells/uL (ref 200–950)
Basophils Absolute: 48 cells/uL (ref 0–200)
Basophils Relative: 0.7 %
Eosinophils Absolute: 290 cells/uL (ref 15–500)
Eosinophils Relative: 4.2 %
HCT: 44.7 % (ref 38.5–50.0)
Hemoglobin: 15.4 g/dL (ref 13.2–17.1)
Lymphs Abs: 2525 cells/uL (ref 850–3900)
MCH: 32.8 pg (ref 27.0–33.0)
MCHC: 34.5 g/dL (ref 32.0–36.0)
MCV: 95.3 fL (ref 80.0–100.0)
MPV: 11.2 fL (ref 7.5–12.5)
Monocytes Relative: 8.1 %
Neutro Abs: 3478 cells/uL (ref 1500–7800)
Neutrophils Relative %: 50.4 %
Platelets: 196 10*3/uL (ref 140–400)
RBC: 4.69 10*6/uL (ref 4.20–5.80)
RDW: 13.3 % (ref 11.0–15.0)
Total Lymphocyte: 36.6 %
WBC: 6.9 10*3/uL (ref 3.8–10.8)

## 2021-05-28 LAB — LIPASE: Lipase: 179 U/L — ABNORMAL HIGH (ref 7–60)

## 2021-05-30 ENCOUNTER — Other Ambulatory Visit: Payer: Medicare Other

## 2021-05-30 ENCOUNTER — Other Ambulatory Visit: Payer: Self-pay

## 2021-05-30 DIAGNOSIS — R197 Diarrhea, unspecified: Secondary | ICD-10-CM

## 2021-05-30 NOTE — Progress Notes (Signed)
Patient has been rescheduled for follow up visit.   Charlene Brooke, CPP notified  Margaretmary Dys, [facility] Assistant [phone]

## 2021-05-31 ENCOUNTER — Telehealth: Payer: Self-pay

## 2021-05-31 ENCOUNTER — Other Ambulatory Visit: Payer: Self-pay | Admitting: Primary Care

## 2021-05-31 DIAGNOSIS — R748 Abnormal levels of other serum enzymes: Secondary | ICD-10-CM

## 2021-05-31 DIAGNOSIS — R197 Diarrhea, unspecified: Secondary | ICD-10-CM

## 2021-05-31 NOTE — Telephone Encounter (Signed)
CALLED PATIENT NO ANSWER LEFT VOICEMAIL FOR A CALL BACK ? ?

## 2021-06-01 ENCOUNTER — Telehealth: Payer: Self-pay

## 2021-06-01 NOTE — Telephone Encounter (Signed)
Scheduled for 06/02/2021

## 2021-06-02 ENCOUNTER — Encounter: Payer: Self-pay | Admitting: Gastroenterology

## 2021-06-02 ENCOUNTER — Other Ambulatory Visit: Payer: Self-pay

## 2021-06-02 ENCOUNTER — Ambulatory Visit (INDEPENDENT_AMBULATORY_CARE_PROVIDER_SITE_OTHER): Payer: Medicare Other | Admitting: Internal Medicine

## 2021-06-02 ENCOUNTER — Encounter: Payer: Self-pay | Admitting: Internal Medicine

## 2021-06-02 ENCOUNTER — Ambulatory Visit (INDEPENDENT_AMBULATORY_CARE_PROVIDER_SITE_OTHER): Payer: Medicare Other | Admitting: Gastroenterology

## 2021-06-02 ENCOUNTER — Telehealth: Payer: Self-pay

## 2021-06-02 VITALS — BP 118/76 | HR 86 | Ht 71.0 in | Wt 218.6 lb

## 2021-06-02 VITALS — BP 106/66 | HR 84 | Temp 98.6°F | Wt 216.2 lb

## 2021-06-02 DIAGNOSIS — R197 Diarrhea, unspecified: Secondary | ICD-10-CM

## 2021-06-02 DIAGNOSIS — Z860100 Personal history of colon polyps, unspecified: Secondary | ICD-10-CM

## 2021-06-02 DIAGNOSIS — I4819 Other persistent atrial fibrillation: Secondary | ICD-10-CM

## 2021-06-02 DIAGNOSIS — I10 Essential (primary) hypertension: Secondary | ICD-10-CM | POA: Diagnosis not present

## 2021-06-02 DIAGNOSIS — Z950 Presence of cardiac pacemaker: Secondary | ICD-10-CM | POA: Diagnosis not present

## 2021-06-02 MED ORDER — NA SULFATE-K SULFATE-MG SULF 17.5-3.13-1.6 GM/177ML PO SOLN: 354.0000 mL | Freq: Once | ORAL | 0 refills | Status: AC

## 2021-06-02 NOTE — Progress Notes (Signed)
HPI Mr .[name] Cook returns today for followup. He is a pleasant 71 yo man with a h/o Chronic atrial fib and CHB s/p PPM insertion. He was to undergo ED surgery but this was cancelled. He denies chest pain or sob. No syncope.  No Known Allergies   Current Outpatient Medications  Medication Sig Dispense Refill   albuterol (VENTOLIN HFA) 108 (90 Base) MCG/ACT inhaler INHALE 2 PUFFS BY MOUTH EVERY 4 HOURS AS NEEDED FOR WHEEZE OR FOR SHORTNESS OF BREATH 6.7 each 0   blood glucose meter kit and supplies KIT Dispense based on patient and insurance preference. Use up to four times daily as directed. (FOR ICD-9 250.00, 250.01). 1 each 0   budesonide-formoterol (SYMBICORT) 160-4.5 MCG/ACT inhaler INHALE 2 PUFFS INTO THE LUNGS TWICE A DAY 10.2 each 5   clopidogrel (PLAVIX) 75 MG tablet TAKE 1 TABLET BY MOUTH EVERY DAY 90 tablet 3   diclofenac Sodium (VOLTAREN) 1 % GEL Apply 2 g topically 4 (four) times daily. 50 g 0   DULoxetine (CYMBALTA) 60 MG capsule TAKE 1 CAPSULE (60 MG TOTAL) BY MOUTH DAILY. FOR DEPRESSION. 90 capsule 1   empagliflozin (JARDIANCE) 10 MG TABS tablet TAKE 1 TABLET BY MOUTH EVERY MORNING FOR DIABETES 90 tablet 3   fish oil-omega-3 fatty acids 1000 MG capsule Take 1 g by mouth daily.       fluticasone (FLONASE) 50 MCG/ACT nasal spray PLACE 1 SPRAY INTO BOTH NOSTRILS DAILY AS NEEDED FOR ALLERGIES OR RHINITIS. 48 mL 3   furosemide (LASIX) 20 MG tablet TAKE 1 TABLET BY MOUTH EVERY DAY 90 tablet 3   gabapentin (NEURONTIN) 300 MG capsule Take 1 capsule (300 mg total) by mouth 2 (two) times daily. For neuropathy 180 capsule 3   glucose blood test strip 1 each by Other route as needed for other. Use to check blood sugar up to three times a day-Accu-Chek meter     isosorbide mononitrate (IMDUR) 60 MG 24 hr tablet TAKE 1 TABLET BY MOUTH EVERY DAY 90 tablet 3   lisinopril (ZESTRIL) 2.5 MG tablet TAKE 1 TABLET BY MOUTH EVERY DAY 90 tablet 3   metFORMIN (GLUCOPHAGE-XR) 500 MG 24 hr tablet Take  1 tablet (500 mg total) by mouth 2 (two) times daily with a meal. For diabetes. 180 tablet 1   metoprolol succinate (TOPROL-XL) 25 MG 24 hr tablet Take 0.5 tablets (12.5 mg total) by mouth daily. 45 tablet 3   nitroGLYCERIN (NITROSTAT) 0.4 MG SL tablet Place 1 tablet (0.4 mg total) under the tongue every 5 (five) minutes as needed for chest pain. 25 tablet 0   pantoprazole (PROTONIX) 40 MG tablet TAKE 1 TABLET (40 MG TOTAL) BY MOUTH DAILY FOR HEARTBURN. 90 tablet 3   rosuvastatin (CRESTOR) 20 MG tablet TAKE 1 TABLET BY MOUTH EVERY DAY IN THE EVENING FOR CHOLESTEROL 90 tablet 1   sitaGLIPtin (JANUVIA) 100 MG tablet TAKE 1 TABLET BY MOUTH ONCE DAILY FOR DIABETES 90 tablet 1   tamsulosin (FLOMAX) 0.4 MG CAPS capsule TAKE 1 CAPSULE (0.4 MG TOTAL) BY MOUTH 2 (TWO) TIMES DAILY. 180 capsule 3   warfarin (COUMADIN) 5 MG tablet TAKE 1/2 TABLET DAILY BY MOUTH EXCEPT TAKE 1 TABLET ON MONDAYS AND THURSDAYS OR AS DIRECTED BY ANTICOAGULATION CLINIC 90 tablet 0   No current facility-administered medications for this visit.     Past Medical History:  Diagnosis Date   Arthritis    "knees and lower back" (03/14/2013)   Atrial flutter ([place])  radiofrequency ablation in 2001   CAD (coronary artery disease)    a. Nonobstructive. Cardiac cath in 2001-50% mid RI, normal LM, LAD, RCA b. cath 10/16/2014 95% mid RCA treated with DES, 99% ost D1 medical management due to small aneurysmal segment   Chronic anticoagulation    chronic Coumadin anticoagulation   Chronic obstructive pulmonary disease ([place]) 04/20/2011   Diabetes mellitus, type 2 (HCC)    GERD (gastroesophageal reflux disease)    Hyperlipidemia    Hypertension    with hypertensive heart disease   Left knee pain 10/25/2017   medial   Obesity    Persistent atrial fibrillation ([place])    recurrent atrial flutter since 2001 s/p DCCVs, multiple failed AADs, h/o tachy-mediated cardiomyopathy   Shortness of breath    "can come on at any time" (03/14/2013)    Sleep apnea    "dx'd; couldn't wear the mask" (03/14/2013)   Tobacco abuse     ROS:   All systems reviewed and negative except as noted in the HPI.   Past Surgical History:  Procedure Laterality Date   ATRIAL FLUTTER ABLATION  2002   atrial flutter; subsequently developed atrial fibrillation   AV NODE ABLATION  01/24/2013   CARDIAC CATHETERIZATION  2002   CARDIAC CATHETERIZATION N/A 10/16/2014   Procedure: Left Heart Cath and Coronary Angiography;  Surgeon: [name], MD;  Location: [place] [facility];  Service: Cardiovascular;  Laterality: N/A;   CARDIOVERSION  05/31/2011   Procedure: CARDIOVERSION;  Surgeon: Cristopher Estimable. Lattie Haw, MD;  Location: [facility];  Service: Cardiovascular;  Laterality: N/A;   CARPAL TUNNEL RELEASE Left 1980's   COLONOSCOPY WITH PROPOFOL N/A 12/30/2018   Procedure: COLONOSCOPY WITH PROPOFOL;  Surgeon: Jonathon Bellows, MD;  Location: [facility] ENDOSCOPY;  Service: Gastroenterology;  Laterality: N/A;   COLONOSCOPY WITH PROPOFOL N/A 12/04/2019   Procedure: COLONOSCOPY WITH PROPOFOL;  Surgeon: Jonathon Bellows, MD;  Location: [facility] ENDOSCOPY;  Service: Gastroenterology;  Laterality: N/A;   INSERT / REPLACE / REMOVE PACEMAKER  01/24/2013    Medtronic Adapta L dual-chamber pacemaker, serial number [id number]    LEFT HEART CATH AND CORONARY ANGIOGRAPHY N/A 06/07/2018   Procedure: LEFT HEART CATH AND CORONARY ANGIOGRAPHY;  Surgeon: [name], MD;  Location: [place] [facility];  Service: Cardiovascular;  Laterality: N/A;   LEFT HEART CATHETERIZATION WITH CORONARY ANGIOGRAM N/A 03/17/2013   Procedure: LEFT HEART CATHETERIZATION WITH CORONARY ANGIOGRAM;  Surgeon: Burnell Blanks, MD; LAD mild dz, D1 branch 100%, inferior branch 99%, CFX OK, RCA 50%, EF 65%     LOOP RECORDER IMPLANT  2002   PERMANENT PACEMAKER INSERTION N/A 01/24/2013   Procedure: PERMANENT PACEMAKER INSERTION;  Surgeon: [name], MD;  Location[facility] CATH LAB;  Service: Cardiovascular;  Laterality: N/A;    TIBIAL TUBERCLERPLASTY  ~ 2003     Family History  Problem Relation Age of Onset   Alzheimer's disease Mother    Osteoporosis Mother      Social History   Socioeconomic History   Marital status: Widowed    Spouse name: Not on file   Number of children: 1   Years of education: Not on file   Highest education level: Not on file  Occupational History   Occupation: Unemployed    Employer: UNEMPLOYED  Tobacco Use   Smoking status: Former    Packs/day: 1.00    Years: 42.00    Pack years: 42.00    Types: Cigarettes    Quit date: 12/30/2013    Years since  quitting: 7.4   Smokeless tobacco: Never  Vaping Use   Vaping Use: Never used  Substance and Sexual Activity   Alcohol use: Not Currently    Comment: 03/14/2013 "stopped drinking back in 2002; never had problem w/it"   Drug use: No   Sexual activity: Not Currently  Other Topics Concern   Not on file  Social History Narrative   Single.   Retired.    1 son, deceased.    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   Enjoys playing on the computer.       Social Determinants of Health   Financial Resource Strain: Low Risk    Difficulty of Paying Living Expenses: Not very hard  Food Insecurity: No Food Insecurity   Worried About Charity fundraiser in the Last Year: Never true   Ran Out of Food in the Last Year: Never true  Transportation Needs: No Transportation Needs   Lack of Transportation (Medical): No   Lack of Transportation (Non-Medical): No  Physical Activity: Inactive   Days of Exercise per Week: 0 days   Minutes of Exercise per Session: 0 min  Stress: No Stress Concern Present   Feeling of Stress : Not at all  Social Connections: Not on file  Intimate Partner Violence: Not At Risk   Fear of Current or Ex-Partner: No   Emotionally Abused: No   Physically Abused: No   Sexually Abused: No     BP 118/76    Pulse 86    Ht '5\' 11"'  (1.803 m)    Wt 218 lb 9.6 oz (99.2  kg)    SpO2 95%    BMI 30.49 kg/m   Physical Exam:  Well appearing NAD HEENT: Unremarkable Neck:  No JVD, no thyromegally Lymphatics:  No adenopathy Back:  No CVA tenderness Lungs:  Clear with no wheezes HEART:  Regular rate rhythm, no murmurs, no rubs, no clicks Abd:  soft, positive bowel sounds, no organomegally, no rebound, no guarding Ext:  2 plus pulses, no edema, no cyanosis, no clubbing Skin:  No rashes no nodules Neuro:  CN II through XII intact, motor grossly intact  EKG - atrial fib with ventricular pacing  DEVICE  Normal device function.  See PaceArt for details.   Assess/Plan:  Atrial fib - his VR is well controlled.  CHB - he is asymptomatic s/p PPM insertion. HTN - his bp is controlled.  4. ED - he is pending injection. I asked him to stop his toprol.  Carleene Overlie [name],MD

## 2021-06-02 NOTE — Addendum Note (Signed)
Addended by: [name] on: 06/02/2021 04:06 PM   Modules accepted: Orders

## 2021-06-02 NOTE — Progress Notes (Signed)
Jonathon Bellows MD, MRCP(U.K) [address]  [place]  [place], [place] [address]  Main: [phone]  Fax: [phone]   Gastroenterology Consultation  Referring Provider:     [name], NP Primary Care Physician:  [name], NP Primary Gastroenterologist:  Dr. Jonathon Bellows  Reason for Consultation:     Diarrhea        HPI:   [name] Cook is a 71 y.o. y/o male referred for consultation & management  by  [name], NP.     He has been referred for diarrhea.  Had stool test performed today, labs on 05/27/2021 CMP was normal and hemoglobin was 15.4 g.  CT scan of the chest for lung screening protocol on 05/07/2021 showed hepatic steatosis with a GI point of view he was previously scheduled for the colonoscopy had a poor prep was rescheduled but canceled on multiple occasions.  I performed a colonoscopy on 12/30/2018 for colon cancer screening a 15 mm polyp was resected in the transverse colon piecemeal.  It was a tubular adenoma.  He states that his visit was scheduled with [place] to see [place] for diarrhea.  Been going on for a few months nonbloody 4 times a day very watery in nature he recently had his Januvia stopped and since then the diarrhea has resolved.  In fact he has not had a bowel movement for 2 days.  He has been taking Motrin on a daily basis 1 time a day.  He also is on metformin he states and consumes diet soda at least 20 ounces per day.  Some weight loss.  He is on Plavix as well as Coumadin.  Past Medical History:  Diagnosis Date   Arthritis    "knees and lower back" (03/14/2013)   Atrial flutter ([facility])    radiofrequency ablation in 2001   CAD (coronary artery disease)    a. Nonobstructive. Cardiac cath in 2001-50% mid RI, normal LM, LAD, RCA b. cath 10/16/2014 95% mid RCA treated with DES, 99% ost D1 medical management due to small aneurysmal segment   Chronic anticoagulation    chronic Coumadin anticoagulation   Chronic obstructive pulmonary disease  ([facility]) 04/20/2011   Diabetes mellitus, type 2 (HCC)    GERD (gastroesophageal reflux disease)    Hyperlipidemia    Hypertension    with hypertensive heart disease   Left knee pain 10/25/2017   medial   Obesity    Persistent atrial fibrillation ([facility])    recurrent atrial flutter since 2001 s/p DCCVs, multiple failed AADs, h/o tachy-mediated cardiomyopathy   Shortness of breath    "can come on at any time" (03/14/2013)   Sleep apnea    "dx'd; couldn't wear the mask" (03/14/2013)   Tobacco abuse     Past Surgical History:  Procedure Laterality Date   ATRIAL FLUTTER ABLATION  2002   atrial flutter; subsequently developed atrial fibrillation   AV NODE ABLATION  01/24/2013   CARDIAC CATHETERIZATION  2002   CARDIAC CATHETERIZATION N/A 10/16/2014   Procedure: Left Heart Cath and Coronary Angiography;  Surgeon: [name], MD;  Location: [facility];  Service: Cardiovascular;  Laterality: N/A;   CARDIOVERSION  05/31/2011   Procedure: CARDIOVERSION;  Surgeon: Cristopher Estimable. Lattie Haw, MD;  Location: [facility];  Service: Cardiovascular;  Laterality: N/A;   CARPAL TUNNEL RELEASE Left 1980's   COLONOSCOPY WITH PROPOFOL N/A 12/30/2018   Procedure: COLONOSCOPY WITH PROPOFOL;  Surgeon: Jonathon Bellows, MD;  Location: [facility];  Service: Gastroenterology;  Laterality: N/A;   COLONOSCOPY WITH PROPOFOL N/A 12/04/2019   Procedure: COLONOSCOPY WITH PROPOFOL;  Surgeon: Jonathon Bellows, MD;  Location: [facility];  Service: Gastroenterology;  Laterality: N/A;   INSERT / REPLACE / REMOVE PACEMAKER  01/24/2013    Medtronic Adapta L dual-chamber pacemaker, serial number [id number]    LEFT HEART CATH AND CORONARY ANGIOGRAPHY N/A 06/07/2018   Procedure: LEFT HEART CATH AND CORONARY ANGIOGRAPHY;  Surgeon: [name], MD;  Location: [facility];  Service: Cardiovascular;  Laterality: N/A;   LEFT HEART CATHETERIZATION WITH CORONARY ANGIOGRAM N/A 03/17/2013   Procedure: LEFT HEART CATHETERIZATION WITH  CORONARY ANGIOGRAM;  Surgeon: Burnell Blanks, MD; LAD mild dz, D1 branch 100%, inferior branch 99%, CFX OK, RCA 50%, EF 65%     LOOP RECORDER IMPLANT  2002   PERMANENT PACEMAKER INSERTION N/A 01/24/2013   Procedure: PERMANENT PACEMAKER INSERTION;  Surgeon: [name], MD;  Location: [facility];  Service: Cardiovascular;  Laterality: N/A;   TIBIAL TUBERCLERPLASTY  ~ 2003    Prior to Admission medications   Medication Sig Start Date End Date Taking? Authorizing Provider  albuterol (VENTOLIN HFA) 108 (90 Base) MCG/ACT inhaler INHALE 2 PUFFS BY MOUTH EVERY 4 HOURS AS NEEDED FOR WHEEZE OR FOR SHORTNESS OF BREATH 05/16/21  Yes [name], NP  blood glucose meter kit and supplies KIT Dispense based on patient and insurance preference. Use up to four times daily as directed. (FOR ICD-9 250.00, 250.01). 04/08/21  Yes [name], NP  budesonide-formoterol (SYMBICORT) 160-4.5 MCG/ACT inhaler INHALE 2 PUFFS INTO THE LUNGS TWICE A DAY 01/12/21  Yes [name], NP  clopidogrel (PLAVIX) 75 MG tablet TAKE 1 TABLET BY MOUTH EVERY DAY 07/21/20  Yes [name], MD  diclofenac Sodium (VOLTAREN) 1 % GEL Apply 2 g topically 4 (four) times daily. 07/17/19  Yes Khatri, Hina, PA-C  DULoxetine (CYMBALTA) 60 MG capsule TAKE 1 CAPSULE (60 MG TOTAL) BY MOUTH DAILY. FOR DEPRESSION. 05/16/21  Yes [name], NP  empagliflozin (JARDIANCE) 10 MG TABS tablet TAKE 1 TABLET BY MOUTH EVERY MORNING FOR DIABETES 11/09/20  Yes [name], NP  fish oil-omega-3 fatty acids 1000 MG capsule Take 1 g by mouth daily.     Yes [provider]  fluticasone (FLONASE) 50 MCG/ACT nasal spray PLACE 1 SPRAY INTO BOTH NOSTRILS DAILY AS NEEDED FOR ALLERGIES OR RHINITIS. 01/22/20  Yes [place], Modena Nunnery, MD  furosemide (LASIX) 20 MG tablet TAKE 1 TABLET BY MOUTH EVERY DAY 07/21/20  Yes [name], MD  gabapentin (NEURONTIN) 300 MG capsule Take 1 capsule (300 mg total) by mouth 2 (two) times  daily. For neuropathy 10/07/20  Yes [name], NP  glucose blood test strip 1 each by Other route as needed for other. Use to check blood sugar up to three times a day-Accu-Chek meter   Yes [provider]  isosorbide mononitrate (IMDUR) 60 MG 24 hr tablet TAKE 1 TABLET BY MOUTH EVERY DAY 07/21/20  Yes [name], MD  lisinopril (ZESTRIL) 2.5 MG tablet TAKE 1 TABLET BY MOUTH EVERY DAY 07/14/20  Yes [name], MD  metFORMIN (GLUCOPHAGE-XR) 500 MG 24 hr tablet Take 1 tablet (500 mg total) by mouth 2 (two) times daily with a meal. For diabetes. 02/02/21  Yes [name], NP  nitroGLYCERIN (NITROSTAT) 0.4 MG SL tablet Place 1 tablet (0.4 mg total) under the tongue every 5 (five) minutes as needed  for chest pain. 04/08/21  Yes [name], NP  pantoprazole (PROTONIX) 40 MG tablet TAKE 1 TABLET (40 MG TOTAL) BY MOUTH DAILY FOR HEARTBURN. 10/26/20  Yes [name], NP  rosuvastatin (CRESTOR) 20 MG tablet TAKE 1 TABLET BY MOUTH EVERY DAY IN THE EVENING FOR CHOLESTEROL 03/04/21  Yes [name], NP  sitaGLIPtin (JANUVIA) 100 MG tablet TAKE 1 TABLET BY MOUTH ONCE DAILY FOR DIABETES 12/15/20  Yes [name], NP  tamsulosin (FLOMAX) 0.4 MG CAPS capsule TAKE 1 CAPSULE (0.4 MG TOTAL) BY MOUTH 2 (TWO) TIMES DAILY. 01/12/21  Yes Chrystal, Eulas Post, MD  warfarin (COUMADIN) 5 MG tablet TAKE 1/2 TABLET DAILY BY MOUTH EXCEPT TAKE 1 TABLET ON MONDAYS AND THURSDAYS OR AS DIRECTED BY ANTICOAGULATION CLINIC 04/15/21  Yes [name], NP    Family History  Problem Relation Age of Onset   Alzheimer's disease Mother    Osteoporosis Mother      Social History   Tobacco Use   Smoking status: Former    Packs/day: 1.00    Years: 42.00    Pack years: 42.00    Types: Cigarettes    Quit date: 12/30/2013    Years since quitting: 7.4   Smokeless tobacco: Never  Vaping Use   Vaping Use: Never used  Substance Use Topics   Alcohol use: Not Currently    Comment:  03/14/2013 "stopped drinking back in 2002; never had problem w/it"   Drug use: No    Allergies as of 06/02/2021   (No Known Allergies)    Review of Systems:    All systems reviewed and negative except where noted in HPI.   Physical Exam:  BP 106/66    Pulse 84    Temp 98.6 F (37 C) (Oral)    Wt 216 lb 3.2 oz (98.1 kg)    BMI 30.15 kg/m  No LMP for male patient. Psych:  Alert and cooperative. Normal mood and affect. General:   Alert,  Well-developed, well-nourished, pleasant and cooperative in NAD Head:  Normocephalic and atraumatic. Eyes:  Sclera clear, no icterus.   Conjunctiva pink. Ears:  Normal auditory acuity. Nose:  No deformity, discharge, or lesions. Mouth:  No deformity or lesions,oropharynx pink & moist. Neck:  Supple; no masses or thyromegaly. Lungs:  Respirations even and unlabored.  Clear throughout to auscultation.   No wheezes, crackles, or rhonchi. No acute distress. Heart:  Regular rate and rhythm; no murmurs, clicks, rubs, or gallops. Abdomen:  Normal bowel sounds.  No bruits.  Soft, non-tender and non-distended without masses, hepatosplenomegaly or hernias noted.  No guarding or rebound tenderness.    Msk:  Symmetrical without gross deformities. Good, equal movement & strength bilaterally. Pulses:  Normal pulses noted. Extremities:  No clubbing or edema.  No cyanosis. Neurologic:  Alert and oriented x3;  grossly normal neurologically. Skin:  Intact without significant lesions or rashes. No jaundice. Lymph Nodes:  No significant cervical adenopathy. Psych:  Alert and cooperative. Normal mood and affect.  Imaging Studies: CT CHEST LUNG CA SCREEN LOW DOSE W/O CM  Result Date: 05/07/2021 CLINICAL DATA:  71 year old male former smoker (quit 7 years ago) with 45 pack-year history of smoking. Lung cancer screening examination. EXAM: CT CHEST WITHOUT CONTRAST LOW-DOSE FOR LUNG CANCER SCREENING TECHNIQUE: Multidetector CT imaging of the chest was performed following  the standard protocol without IV contrast. RADIATION DOSE REDUCTION: This exam was performed according to the departmental dose-optimization program which includes automated exposure control, adjustment of the  mA and/or kV according to patient size and/or use of iterative reconstruction technique. COMPARISON:  Chest CTA 06/04/2018. FINDINGS: Cardiovascular: Heart size is normal. There is no significant pericardial fluid, thickening or pericardial calcification. There is aortic atherosclerosis, as well as atherosclerosis of the great vessels of the mediastinum and the coronary arteries, including calcified atherosclerotic plaque in the left main, left anterior descending, left circumflex and right coronary arteries. Left-sided pacemaker device in place with lead tips terminating in the right atrium and right ventricular apex. Mediastinum/Nodes: No pathologically enlarged mediastinal or hilar lymph nodes. Please note that accurate exclusion of hilar adenopathy is limited on noncontrast CT scans. Esophagus is unremarkable in appearance. No axillary lymphadenopathy. Lungs/Pleura: Small pulmonary nodules are noted in the lungs, largest of which is in the anterior aspect of the right upper lobe (axial image 159 of series 3), with a volume derived mean diameter of 4.8 mm. No other larger more suspicious appearing pulmonary nodules or masses are noted. No acute consolidative airspace disease. No pleural effusions. Mild diffuse bronchial wall thickening with mild centrilobular and paraseptal emphysema. Upper Abdomen: Aortic atherosclerosis. Diffuse low attenuation throughout the visualized hepatic parenchyma, indicative of a background of hepatic steatosis. Musculoskeletal: There are no aggressive appearing lytic or blastic lesions noted in the visualized portions of the skeleton. IMPRESSION: 1. Lung-RADS 2S, benign appearance or behavior. Continue annual screening with low-dose chest CT without contrast in 12 months. 2. The  "S" modifier above refers to potentially clinically significant non lung cancer related findings. Specifically, there is aortic atherosclerosis, in addition to left main and three-vessel coronary artery disease. Please note that although the presence of coronary artery calcium documents the presence of coronary artery disease, the severity of this disease and any potential stenosis cannot be assessed on this non-gated CT examination. Assessment for potential risk factor modification, dietary therapy or pharmacologic therapy may be warranted, if clinically indicated. 3. Mild diffuse bronchial wall thickening with mild centrilobular and paraseptal emphysema; imaging findings suggestive of underlying COPD. 4. Hepatic steatosis. Aortic Atherosclerosis (ICD10-I70.0) and Emphysema (ICD10-J43.9). Electronically Signed   By: Vinnie Langton M.D.   On: 05/07/2021 06:21   Assessment and Plan:   KAO CONRY is a 71 y.o. y/o male has been referred for diarrhea ongoing for a few months which has resolved after holding off on his Januvia he states.  I also noted that he has been taking diclofenac for a long time which can also cause colitis and diarrhea which advised him strongly to stop.  He also consumes daily diet soda which can cause an osmotic diarrhea which I also suggested he stop if possible.  Since he is having no diarrhea I do not believe he should have further work-up for his diarrhea.  He is overdue for a surveillance colonoscopy due to prior history of colon polyps which we will go ahead and schedule.  If his diarrhea were to return he should call us back and see me back at the office.   I have discussed alternative options, risks & benefits,  which include, but are not limited to, bleeding, infection, perforation,respiratory complication & drug reaction.  The patient agrees with this plan & written consent will be obtained.     Follow up in as needed  Dr Jonathon Bellows MD,MRCP(U.K)

## 2021-06-02 NOTE — Patient Instructions (Addendum)
Medication Instructions:  Your physician has recommended you make the following change in your medication:    STOP taking metoprolol succinate   Labwork: None ordered.  Testing/Procedures: None ordered.  Follow-Up: Your physician wants you to follow-up in: one year with [name], MD or one of the following Advanced Practice Providers on your designated Care Team:   Tommye Standard, [place] Legrand Como "Jonni Sanger" Chalmers Cater, [place]  Remote monitoring is used to monitor your Pacemaker from home. This monitoring reduces the number of office visits required to check your device to one time per year. It allows [place] to keep an eye on the functioning of your device to ensure it is working properly. You are scheduled for a device check from home on 06/24/2021. You may send your transmission at any time that day. If you have a wireless device, the transmission will be sent automatically. After your physician reviews your transmission, you will receive a postcard with your next transmission date.  Any Other Special Instructions Will Be Listed Below (If Applicable).  If you need a refill on your cardiac medications before your next appointment, please call your pharmacy.

## 2021-06-02 NOTE — Telephone Encounter (Signed)
Blood thinner request was faxed to Dr. Carleene Overlie for Plavix and Coumadin.  Procedure date: 06/13/2021

## 2021-06-03 ENCOUNTER — Other Ambulatory Visit (INDEPENDENT_AMBULATORY_CARE_PROVIDER_SITE_OTHER): Payer: Medicare Other

## 2021-06-03 ENCOUNTER — Telehealth: Payer: Self-pay | Admitting: *Deleted

## 2021-06-03 ENCOUNTER — Other Ambulatory Visit: Payer: Self-pay

## 2021-06-03 DIAGNOSIS — R748 Abnormal levels of other serum enzymes: Secondary | ICD-10-CM

## 2021-06-03 DIAGNOSIS — R197 Diarrhea, unspecified: Secondary | ICD-10-CM

## 2021-06-03 LAB — GASTROINTESTINAL PATHOGEN PANEL PCR
C. difficile Tox A/B, PCR: NOT DETECTED
Campylobacter, PCR: NOT DETECTED
Cryptosporidium, PCR: NOT DETECTED
E coli (ETEC) LT/ST PCR: NOT DETECTED
E coli (STEC) stx1/stx2, PCR: NOT DETECTED
E coli 0157, PCR: NOT DETECTED
Giardia lamblia, PCR: NOT DETECTED
Norovirus, PCR: NOT DETECTED
Rotavirus A, PCR: NOT DETECTED
Salmonella, PCR: NOT DETECTED
Shigella, PCR: NOT DETECTED

## 2021-06-03 LAB — AMYLASE: Amylase: 84 U/L (ref 27–131)

## 2021-06-03 LAB — LIPASE: Lipase: 81 U/L — ABNORMAL HIGH (ref 11.0–59.0)

## 2021-06-03 NOTE — Telephone Encounter (Signed)
Will route request to [name], who saw the patient on 06/02/2021 for preoperative cardiac risk stratification.    [name], you evaluated him 06/02/2021 for preoperative clearance. Is is acceptable risk for noncardiac surgery and is he ok to hold Plavix, prior to surgery, if so, please provide number of days you are ok with him holding.   The request to hold his warfarin will need to be sent to his PCP by the requesting team, as they manage this.

## 2021-06-03 NOTE — Telephone Encounter (Signed)
Refill request received for Fluticasone nasal spray. Request refused. Pt has new PCP.

## 2021-06-03 NOTE — Telephone Encounter (Signed)
° °  Pre-operative Risk Assessment    Patient Name: [name] Cook  DOB: 10-Oct-1950 MRN: [id number]      Request for Surgical Clearance    Procedure:   COLONOSCOPY  Date of Surgery:  Clearance 06/13/21                                 Surgeon:  DR. Bailey Mech ANNA Surgeon's Group or Practice Name:  [facility] Phone number:  [phone] Fax number:  [phone] ATTN: [name], CMA   Type of Clearance Requested:   - Medical  - Pharmacy:  Hold Clopidogrel (Plavix) and Warfarin (Coumadin)     Type of Anesthesia:   PROPOFOL   Additional requests/questions:    [name]   06/03/2021, 9:08 AM

## 2021-06-07 ENCOUNTER — Telehealth: Payer: Self-pay | Admitting: Primary Care

## 2021-06-07 ENCOUNTER — Telehealth: Payer: Self-pay

## 2021-06-07 NOTE — Telephone Encounter (Signed)
Called and spoke to patient all documentation added to lab results  notes and sent to [facility] for review.

## 2021-06-07 NOTE — Chronic Care Management (AMB) (Signed)
Chronic Care Management Pharmacy Assistant   Name: JADD GASIOR  MRN: [id number] DOB: Feb 28, 1951   Reason for Encounter: Reminder Call   Conditions to be addressed/monitored: CAD, HTN, HLD, COPD, and DMII  Medications: Outpatient Encounter Medications as of 06/07/2021  Medication Sig   albuterol (VENTOLIN HFA) 108 (90 Base) MCG/ACT inhaler INHALE 2 PUFFS BY MOUTH EVERY 4 HOURS AS NEEDED FOR WHEEZE OR FOR SHORTNESS OF BREATH   blood glucose meter kit and supplies KIT Dispense based on patient and insurance preference. Use up to four times daily as directed. (FOR ICD-9 250.00, 250.01).   budesonide-formoterol (SYMBICORT) 160-4.5 MCG/ACT inhaler INHALE 2 PUFFS INTO THE LUNGS TWICE A DAY   clopidogrel (PLAVIX) 75 MG tablet TAKE 1 TABLET BY MOUTH EVERY DAY   diclofenac Sodium (VOLTAREN) 1 % GEL Apply 2 g topically 4 (four) times daily.   DULoxetine (CYMBALTA) 60 MG capsule TAKE 1 CAPSULE (60 MG TOTAL) BY MOUTH DAILY. FOR DEPRESSION.   empagliflozin (JARDIANCE) 10 MG TABS tablet TAKE 1 TABLET BY MOUTH EVERY MORNING FOR DIABETES   fish oil-omega-3 fatty acids 1000 MG capsule Take 1 g by mouth daily.     fluticasone (FLONASE) 50 MCG/ACT nasal spray PLACE 1 SPRAY INTO BOTH NOSTRILS DAILY AS NEEDED FOR ALLERGIES OR RHINITIS.   furosemide (LASIX) 20 MG tablet TAKE 1 TABLET BY MOUTH EVERY DAY   gabapentin (NEURONTIN) 300 MG capsule Take 1 capsule (300 mg total) by mouth 2 (two) times daily. For neuropathy   glucose blood test strip 1 each by Other route as needed for other. Use to check blood sugar up to three times a day-Accu-Chek meter   isosorbide mononitrate (IMDUR) 60 MG 24 hr tablet TAKE 1 TABLET BY MOUTH EVERY DAY   lisinopril (ZESTRIL) 2.5 MG tablet TAKE 1 TABLET BY MOUTH EVERY DAY   metFORMIN (GLUCOPHAGE-XR) 500 MG 24 hr tablet Take 1 tablet (500 mg total) by mouth 2 (two) times daily with a meal. For diabetes.   nitroGLYCERIN (NITROSTAT) 0.4 MG SL tablet Place 1 tablet (0.4 mg total)  under the tongue every 5 (five) minutes as needed for chest pain.   pantoprazole (PROTONIX) 40 MG tablet TAKE 1 TABLET (40 MG TOTAL) BY MOUTH DAILY FOR HEARTBURN.   rosuvastatin (CRESTOR) 20 MG tablet TAKE 1 TABLET BY MOUTH EVERY DAY IN THE EVENING FOR CHOLESTEROL   sitaGLIPtin (JANUVIA) 100 MG tablet TAKE 1 TABLET BY MOUTH ONCE DAILY FOR DIABETES   tamsulosin (FLOMAX) 0.4 MG CAPS capsule TAKE 1 CAPSULE (0.4 MG TOTAL) BY MOUTH 2 (TWO) TIMES DAILY.   warfarin (COUMADIN) 5 MG tablet TAKE 1/2 TABLET DAILY BY MOUTH EXCEPT TAKE 1 TABLET ON MONDAYS AND THURSDAYS OR AS DIRECTED BY ANTICOAGULATION CLINIC   No facility-administered encounter medications on file as of 06/07/2021.    GUIDO COMP was contacted to remind of upcoming telephone visit with Charlene Brooke on 04/09/22 at 8:45am. Patient was reminded to have all medications, supplements and any blood glucose and blood pressure readings available for review at appointment. If unable to reach, a voicemail was left for patient.    Are you having any problems with your medications? No   Do you have any concerns you like to discuss with the pharmacist? No    Star Rating Drugs: Medication:   Last Fill: Day Supply Lisinopril 2.52m  04/16/21 90 Metformin XR 5082m 04/11/21 90 Rosuvastatin 2062m2/16/23 90 Januvia 100m74m/11/23 90 Jardiance 10mg70m30/23 90  [facility] notified  Avel Sensor, [place] Assistant [phone]  Total time spent for month CPA: 10 min.

## 2021-06-07 NOTE — Telephone Encounter (Signed)
[name] Cook, He told me the surgery was cancelled. He can hold up to 5 days. GT

## 2021-06-07 NOTE — Telephone Encounter (Signed)
[name] Cook called in and stated that Anda Kraft took him off 2 medications for diabetes and now his sugar is elevated to 212

## 2021-06-08 ENCOUNTER — Other Ambulatory Visit: Payer: Self-pay | Admitting: Primary Care

## 2021-06-08 DIAGNOSIS — E1165 Type 2 diabetes mellitus with hyperglycemia: Secondary | ICD-10-CM

## 2021-06-08 MED ORDER — GLIPIZIDE ER 10 MG PO TB24: 10.0000 mg | ORAL_TABLET | Freq: Every day | ORAL | 1 refills | Status: DC

## 2021-06-08 NOTE — Telephone Encounter (Signed)
Noted.  Pt is normally given dosing instructions to boost warfarin for 2-4 days starting the day after a procedure. Unsure if pt has been given instructions for post warfarin dosing. Will send to PCP to inquire direction for post dosing of warfarin.   Recommendation from [facility] for this pt's post-procedure warfarin dosing are below.  2/27: Procedure; NO coumadin  2/28: Restart warfarin if deemed safe by surgeon; Take 1 tablet (5 mg) wafarin (usual dose is 2.5 mg) 3/1: Take 1 tablet (5 mg) wafarin (usual dose is 2.5 mg) 3/2: Take 1 1/2 tablets (7.5 mg) wafarin (usual dose 5 mg) 3/3: Resume normal dosing; take 1/2 tablet (2.5 mg) daily except take 1 tablet (5 mg) on Mondays, Wednesdays and Fridays  3/9: Recheck INR, hold warfarin until recheck of INR

## 2021-06-08 NOTE — Telephone Encounter (Signed)
° °  Name: [name] Cook  DOB: [birth year]  MRN: [id number]   Primary Cardiologist: [name], MD  Chart reviewed as part of pre-operative protocol coverage. Patient was contacted 06/08/2021 in reference to pre-operative risk assessment for pending surgery as outlined below.  [name] Cook was recently seen 06/02/20 by [name] and felt to be doing well, to follow up in one year. Although PCP manages INR he appears to be on this for Afib so will route to pharm for input on holding then clearance can be granted.  Charlie Pitter, PA-C 06/08/2021, 7:47 AM

## 2021-06-08 NOTE — Telephone Encounter (Signed)
° ° °  Patient Name: [name] Cook  DOB: 1951/01/21 MRN: [id number]  Primary Cardiologist: [name], MD  Chart reviewed as part of pre-operative protocol coverage. Given past medical history and time since last visit, based on ACC/AHA guidelines, [name] Cook would be at acceptable risk for the planned procedure without further cardiovascular testing.   Per [name], he can hold his Plavix for up to 5 days. Per pharmD, Per office protocol, patient can hold warfarin for 5 days prior to procedure. Patient will NOT need bridging with Lovenox (enoxaparin) around procedure.  I reached out to patient to give him these instructions given the time sensitive nature. He confirms he had not taken either yet today, and will begin holding Plavix/clopidogrel and Coumadin/warfarin starting today. We typically advise that blood thinners be resumed when felt safe by performing physician. The patient was advised that if he develops new symptoms prior to surgery to contact our office to arrange for a follow-up visit. He verbalized understanding of the above.  I will route this recommendation to the requesting party via Epic fax function and remove from pre-op pool. I will also route this message to PCP since they follow INR to let them know of the interruption in anticoagulation for upcoming colonoscopy.  Please call with questions.  Charlie Pitter, PA-C 06/08/2021, 9:09 AM

## 2021-06-08 NOTE — Telephone Encounter (Signed)
Patient with diagnosis of afib on warfarin for anticoagulation.    Procedure: colonoscopy Date of procedure: 06/13/21  CHA2DS2-VASc Score = 4   This indicates a 4.8% annual risk of stroke. The patient's score is based upon: CHF History: 0 HTN History: 1 Diabetes History: 1 Stroke History: 0 Vascular Disease History: 1 Age Score: 1 Gender Score: 0      Per office protocol, patient can hold warfarin for 5 days prior to procedure.    Patient will NOT need bridging with Lovenox (enoxaparin) around procedure.

## 2021-06-08 NOTE — Telephone Encounter (Signed)
Thank you, Larene Beach. Okay to proceed with post procedure recommendations provided.

## 2021-06-08 NOTE — Telephone Encounter (Signed)
We received a letter from Dr. Carleene Overlie and it stated that they had contacted the patient to let him know to hold his Plavix and coumadin. I then called the patient to make sure and he stated that they had called him to give him instructions. Patient is ready for his procedure on 06/13/2021.

## 2021-06-08 NOTE — Telephone Encounter (Signed)
Noted and appreciate the update.  Delena Serve.

## 2021-06-09 NOTE — Telephone Encounter (Signed)
Contacted pt and advised of warfarin dosing after colonoscopy. Advised if surgeon gives other instructions on when to start warfarin to follow surgeon instructions and also contact coumadin clinic to know of change. Advised f/u coumadin clinic apt is on 3/9. Advised if anything is needed to contact clinic. Pt readback instructions and verbalized understanding.

## 2021-06-10 ENCOUNTER — Ambulatory Visit (INDEPENDENT_AMBULATORY_CARE_PROVIDER_SITE_OTHER): Payer: Medicare Other | Admitting: Pharmacist

## 2021-06-10 ENCOUNTER — Other Ambulatory Visit: Payer: Self-pay

## 2021-06-10 DIAGNOSIS — E1165 Type 2 diabetes mellitus with hyperglycemia: Secondary | ICD-10-CM

## 2021-06-10 DIAGNOSIS — I4819 Other persistent atrial fibrillation: Secondary | ICD-10-CM

## 2021-06-10 DIAGNOSIS — E785 Hyperlipidemia, unspecified: Secondary | ICD-10-CM

## 2021-06-10 DIAGNOSIS — I10 Essential (primary) hypertension: Secondary | ICD-10-CM

## 2021-06-10 DIAGNOSIS — F3341 Major depressive disorder, recurrent, in partial remission: Secondary | ICD-10-CM

## 2021-06-10 NOTE — Progress Notes (Signed)
Chronic Care Management Pharmacy Note  12/21/20 Name:  [name] Cook MRN:  [id number] DOB:  1950-06-18  Summary: CCM F/U visit -Pt reports diarrhea still comes and goes after stopping metformin, Januvia. He has colonscopy on Monday 2/27 (currently holding Plavix and warfarin).  -Pt has not picked up glipizide yet; his BG has been > 200 without metformin and Januvia -Pt was recently taken off metoprolol and has not checked BP at home since  Recommendations/Changes made from today's visit: -Advised to follow up with GI regarding diarrhea if it does not improve -Advised to pick up glipizide and start taking 1 tablet daily with breakfast -Advised to check BP at home for a few days to ensure it is not too high off metoprolol  Plan: -[name] Cook will call patient 2 months for BP/BG log -Pharmacist follow up televisit scheduled for 3 months -PCP appt 06/23/21   Subjective: [name] Cook is an 71 y.o. year old male who is a primary patient of [name], NP.  The CCM team was consulted for assistance with disease management and care coordination needs.    Engaged with patient by telephone for follow up visit in response to provider referral for pharmacy case management and/or care coordination services.   Consent to Services:  The patient was given information about Chronic Care Management services, agreed to services, and gave verbal consent prior to initiation of services.  Please see initial visit note for detailed documentation.   Patient Care Team: [name], NP as PCP - General (Internal Medicine) [name], MD as PCP - Cardiology (Cardiology) [name], MD as PCP - Electrophysiology (Cardiology) Rothbart, Cristopher Estimable, MD (Inactive) (Cardiology) Noreene Filbert, MD as Radiation Oncologist (Radiation Oncology) Charlton Haws, [facility] as Pharmacist (Pharmacist)  Recent office visits: 06/08/21 Phone note - BG has elevated to 212. Started glipizide 10  mg. 05/27/21 NP Alma Friendly OV: c/o diarrhea. Stop metformin. Complete stool sample kit. Referred for colonoscopy. Lipase elevated, concern for pancreatitis, advised to stop Januvia as well.  04/08/21 NP Alma Friendly OV: f/u DM, depression. Pt did not increase metformin as instructed last visit. A1c 7.0. No med changes. F/u 6 months.  03/23/21 NP Alma Friendly OV: f/u fatigue, depression. Increase duloxetine to 60 mg daily. Increase metformin to 2 tab AM or PM.  03/07/21 NP Karl Ito OV: acute bronchitis. Rx'd Augmentin and prednisone. 03/02/21 NP Karl Ito VV: c/o acute cough. Negative flu and covid tests. Rx'd guafenesin-codeine syrup.  02/02/21 NP Alma Friendly OV: c/o diarrhea. Reduce metformin to 500 mg BID.   12/30/20 NP Alma Friendly OV: f/u DM. Stop citalopram and start duloxetine 30 mg daily. Change metformin to ER 500 mg and increase to 2 tab BID.   11/22/20 - Family Medicine - Patient presented for follow up abdomen pain. Improving on current regimen. Advised to finish taking the antibiotic as prescribed earlier. Follow up if symptoms do not improve.  Recent consult visits:  06/02/21 Dr Vicente Males (GI): c/o diarrhea. Resolved after stopping Januvia and metformin. Advised to stop diclofenac and diet soda. No further w/u for diarrhea as it has resolved. Scheduled surveillance colonoscopy.  06/02/21 Dr [name] (cardiology): f/u Afib. Stop metoprolol.  04/29/21 NP Eric Form (Pulmonary): hx tobacco use 02/15/21 PA Zara Council (Urology): f/u ED, prostate cancer. Not a candidate for PDE-5 due to nitrate use. S/p EBRT and Lupron for Prostate Ca 2020. 11/23/20 - Cardiology - Patient presented for routine electrophysiology follow up. No medication  changes. 06/15/20 - Urology - Patient presented for follow up prostate cancer. Currently on Flomax twice daily, may titrate off if his symptoms are well controlled.  Hospital visits: None in previous 6 months   Objective:  Lab Results   Component Value Date   CREATININE 1.06 05/27/2021   BUN 21 05/27/2021   GFR 63.25 03/23/2021   GFRNONAA >60 01/23/2020   GFRAA 72 06/23/2019   NA 139 05/27/2021   K 4.3 05/27/2021   CALCIUM 9.2 05/27/2021   CO2 23 05/27/2021   GLUCOSE 104 (H) 05/27/2021    Lab Results  Component Value Date/Time   HGBA1C 7.0 (H) 05/10/2021 09:50 AM   HGBA1C 6.5 (A) 01/31/2021 09:45 AM   HGBA1C 7.6 (H) 10/07/2020 10:18 AM   HGBA1C 6.4 (H) 11/27/2016 11:33 AM   HGBA1C 7.1 (H) 08/21/2016 10:25 AM   GFR 63.25 03/23/2021 02:59 PM   GFR 68.24 11/16/2020 12:30 PM   MICROALBUR <0.7 09/21/2017 01:06 PM   MICROALBUR <0.2 11/27/2016 11:33 AM    Last diabetic Eye exam:  Lab Results  Component Value Date/Time   HMDIABEYEEXA No Retinopathy 08/28/2020 12:00 AM    Last diabetic Foot exam: 09/2019 PCP  Lab Results  Component Value Date   CHOL 131 10/07/2020   HDL 43.90 10/07/2020   LDLCALC 55 10/07/2020   TRIG 161.0 (H) 10/07/2020   CHOLHDL 3 10/07/2020    Hepatic Function Latest Ref Rng & Units 11/16/2020 10/07/2020 01/23/2020  Total Protein 6.0 - 8.3 g/dL 6.6 6.5 7.2  Albumin 3.5 - 5.2 g/dL 4.2 4.3 4.3  AST 0 - 37 U/L '21 21 20  ' ALT 0 - 53 U/L '20 20 19  ' Alk Phosphatase 39 - 117 U/L 44 51 55  Total Bilirubin 0.2 - 1.2 mg/dL 0.6 0.6 0.9  Bilirubin, Direct 0.00 - 0.40 mg/dL - - -    Lab Results  Component Value Date/Time   TSH 2.030 04/15/2019 10:05 AM   TSH 1.93 10/27/2015 10:54 AM   FREET4 1.38 03/14/2013 11:12 AM   FREET4 1.11 05/10/2010 10:02 AM    CBC Latest Ref Rng & Units 05/27/2021 03/23/2021 11/16/2020  WBC 3.8 - 10.8 Thousand/uL 6.9 6.6 4.7  Hemoglobin 13.2 - 17.1 g/dL 15.4 14.6 14.6  Hematocrit 38.5 - 50.0 % 44.7 43.6 43.9  Platelets 140 - 400 Thousand/uL 196 194.0 118.0(L)    Lab Results  Component Value Date/Time   VD25OH 32 10/27/2015 10:54 AM    Clinical ASCVD: Yes  The ASCVD Risk score (Arnett DK, et al., 2019) failed to calculate for the following reasons:   The patient  has a prior MI or stroke diagnosis    Depression screen [facility] 2/9 04/08/2021 02/02/2021 12/30/2020  Decreased Interest 0 0 2  Down, Depressed, Hopeless 0 0 0  PHQ - 2 Score 0 0 2  Altered sleeping 0 0 3  Tired, decreased energy 2 0 3  Change in appetite 0 0 0  Feeling bad or failure about yourself  0 0 0  Trouble concentrating 0 0 0  Moving slowly or fidgety/restless 0 0 0  Suicidal thoughts 0 0 0  PHQ-9 Score 2 0 8  Difficult doing work/chores Not difficult at all Not difficult at all Not difficult at all  Some recent data might be hidden     Social History   Tobacco Use  Smoking Status Former   Packs/day: 1.00   Years: 42.00   Pack years: 42.00   Types: Cigarettes   Quit date: 12/30/2013  Years since quitting: 7.4  Smokeless Tobacco Never   BP Readings from Last 3 Encounters:  06/02/21 106/66  06/02/21 118/76  05/27/21 126/64   Pulse Readings from Last 3 Encounters:  06/02/21 84  06/02/21 86  05/27/21 90   Wt Readings from Last 3 Encounters:  06/02/21 216 lb 3.2 oz (98.1 kg)  06/02/21 218 lb 9.6 oz (99.2 kg)  05/27/21 212 lb (96.2 kg)   BMI Readings from Last 3 Encounters:  06/02/21 30.15 kg/m  06/02/21 30.49 kg/m  05/27/21 29.57 kg/m    Assessment/Interventions: Review of patient past medical history, allergies, medications, health status, including review of consultants reports, laboratory and other test data, was performed as part of comprehensive evaluation and provision of chronic care management services.   SDOH:  (Social Determinants of Health) assessments and interventions performed: Yes   SDOH Screenings   Alcohol Screen: Low Risk    Last Alcohol Screening Score (AUDIT): 0  Depression (PHQ2-9): Low Risk    PHQ-2 Score: 2  Financial Resource Strain: Low Risk    Difficulty of Paying Living Expenses: Not very hard  Food Insecurity: No Food Insecurity   Worried About Charity fundraiser in the Last Year: Never true   Ran Out of Food in the Last  Year: Never true  Housing: Low Risk    Last Housing Risk Score: 0  Physical Activity: Inactive   Days of Exercise per Week: 0 days   Minutes of Exercise per Session: 0 min  Social Connections: Not on file  Stress: No Stress Concern Present   Feeling of Stress : Not at all  Tobacco Use: Medium Risk   Smoking Tobacco Use: Former   Smokeless Tobacco Use: Never   Passive Exposure: Not on file  Transportation Needs: No Transportation Needs   Lack of Transportation (Medical): No   Lack of Transportation (Non-Medical): No    CCM Care Plan  No Known Allergies  Medications Reviewed Today     Reviewed by Charlton Haws, [facility] (Pharmacist) on 06/10/21 at [phone]  Med List Status: <None>   Medication Order Taking? Sig Documenting Provider Last Dose Status Informant  albuterol (VENTOLIN HFA) 108 (90 Base) MCG/ACT inhaler [id number] Yes INHALE 2 PUFFS BY MOUTH EVERY 4 HOURS AS NEEDED FOR WHEEZE OR FOR SHORTNESS OF Nicholes Stairs, NP Taking Active   blood glucose meter kit and supplies KIT [id number] Yes Dispense based on patient and insurance preference. Use up to four times daily as directed. (FOR ICD-9 250.00, 250.01). [name], NP Taking Active   budesonide-formoterol [facility]) 160-4.5 MCG/ACT inhaler [id number] Yes INHALE 2 PUFFS INTO THE LUNGS TWICE A DAY [name], NP Taking Active   clopidogrel (PLAVIX) 75 MG tablet [id number]  TAKE 1 TABLET BY MOUTH EVERY DAY [name], MD  Active            Med Note Charlton Haws   Fri Jun 10, 2021  8:56 AM) On hold for colonoscopy 06/13/21  diclofenac Sodium (VOLTAREN) 1 % GEL [id number] Yes Apply 2 g topically 4 (four) times daily. Delia Heady, PA-C Taking Active   DULoxetine (CYMBALTA) 60 MG capsule [id number] Yes TAKE 1 CAPSULE (60 MG TOTAL) BY MOUTH DAILY. FOR DEPRESSION. [name], NP Taking Active   empagliflozin (JARDIANCE) 10 MG TABS tablet [id number] Yes TAKE 1 TABLET BY MOUTH EVERY MORNING FOR  DIABETES [name], NP Taking Active   fish oil-omega-3 fatty acids 1000 MG capsule [id number] Yes Take  1 g by mouth daily.   [provider] Taking Active Self  fluticasone (FLONASE) 50 MCG/ACT nasal spray [id number] Yes PLACE 1 SPRAY INTO BOTH NOSTRILS DAILY AS NEEDED FOR ALLERGIES OR RHINITIS. Alycia Rossetti, MD Taking Active   furosemide (LASIX) 20 MG tablet [id number] Yes TAKE 1 TABLET BY MOUTH EVERY DAY [name], MD Taking Active   gabapentin (NEURONTIN) 300 MG capsule [id number] Yes Take 1 capsule (300 mg total) by mouth 2 (two) times daily. For neuropathy [name], NP Taking Active   glipiZIDE (GLIPIZIDE XL) 10 MG 24 hr tablet [id number] Yes Take 1 tablet (10 mg total) by mouth daily with breakfast. For diabetes. [name], NP Taking Active   glucose blood test strip [id number] Yes 1 each by Other route as needed for other. Use to check blood sugar up to three times a day-Accu-Chek meter [provider] Taking Active   isosorbide mononitrate (IMDUR) 60 MG 24 hr tablet [id number] Yes TAKE 1 TABLET BY MOUTH EVERY DAY [name], MD Taking Active   lisinopril (ZESTRIL) 2.5 MG tablet [id number] Yes TAKE 1 TABLET BY MOUTH EVERY DAY [name], MD Taking Active   nitroGLYCERIN (NITROSTAT) 0.4 MG SL tablet [id number] Yes Place 1 tablet (0.4 mg total) under the tongue every 5 (five) minutes as needed for chest pain. [name], NP Taking Active   pantoprazole (PROTONIX) 40 MG tablet [id number] Yes TAKE 1 TABLET (40 MG TOTAL) BY MOUTH DAILY FOR HEARTBURN. [name], NP Taking Active   rosuvastatin (CRESTOR) 20 MG tablet [id number] Yes TAKE 1 TABLET BY MOUTH EVERY DAY IN THE EVENING FOR CHOLESTEROL [name], NP Taking Active   tamsulosin (FLOMAX) 0.4 MG CAPS capsule [id number] Yes TAKE 1 CAPSULE (0.4 MG TOTAL) BY MOUTH 2 (TWO) TIMES DAILY. Noreene Filbert, MD Taking Active   warfarin (COUMADIN) 5 MG tablet [id number]  TAKE 1/2  TABLET DAILY BY MOUTH EXCEPT TAKE 1 TABLET ON MONDAYS AND THURSDAYS OR AS DIRECTED BY ANTICOAGULATION CLINIC [name], NP  Active            Med Note Charlton Haws   Fri Jun 10, 2021  8:56 AM) On hold for colonoscopy 06/13/21            Patient Active Problem List   Diagnosis Date Noted   Acute bronchitis with COPD ([place]) 03/07/2021   Acute cough 03/02/2021   Body aches 03/02/2021   Fever 03/02/2021   Chest wall pain 11/22/2020   Left lower quadrant abdominal pain 10/26/2020   History of diverticulitis 02/06/2020   Preventative health care 10/09/2019   Ear canal dryness, bilateral 08/12/2019   Polyp of colon    Decreased renal function 06/27/2018   Prostate cancer ([place]) 06/10/2018   Chest pain 06/04/2018   OSA (obstructive sleep apnea) 10/01/2017   Encounter for screening colonoscopy 10/01/2017   Osteoarthritis 06/26/2017   Insomnia 10/01/2014   Diarrhea 04/13/2014   Depression 03/09/2014   Coronary artery disease 11/06/2013   Long term current use of anticoagulant therapy 06/19/2013   Pacemaker 04/30/2013   Acute myocardial infarction, subendocardial infarction, initial episode of care ([place]) 03/15/2013   Abnormal LFTs 05/02/2012   Chronic kidney disease, 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/26/2011   Chronic obstructive pulmonary disease ([place]) 04/20/2011   Atrial fibrillation    Hypertension    Hyperlipidemia    Diabetes mellitus type 2, uncomplicated (HCC)    Atrial flutter (HCC)    GASTROESOPHAGEAL REFLUX DISEASE  05/16/2010    Immunization History  Administered Date(s) Administered   Fluad Quad(high Dose 65+) 02/02/2021   Influenza, High Dose Seasonal PF 02/20/2018, 02/03/2019   Influenza,inj,Quad PF,6+ Mos 02/05/2014, 01/04/2015, 04/20/2016   Influenza-Unspecified 01/05/2011, 01/15/2013, 01/30/2020   PFIZER(Purple Top)SARS-COV-2 Vaccination 09/17/2019, 10/08/2019   Pneumococcal Conjugate-13 09/21/2017   Pneumococcal Polysaccharide-23 01/05/2011   Tdap  01/05/2011    Conditions to be addressed/monitored:  Hypertension, Hyperlipidemia, Diabetes, COPD, and Depression  Care Plan : [place]  Updates made by Charlton Haws, [place] since 06/10/2021 12:00 AM     Problem: Hypertension, Hyperlipidemia, Diabetes, COPD, and Depression   Priority: High  Onset Date: 12/07/2020     Long-Range Goal: Disease mgmt   Start Date: 06/10/2021  Expected End Date: 06/10/2022  This Visit's Progress: On track  Priority: High  Note:   Current Barriers:  Unable to independently monitor therapeutic efficacy Unable to maintain control of blood sugar  Pharmacist Clinical Goal(s):  Patient will achieve adherence to monitoring guidelines and medication adherence to achieve therapeutic efficacy adhere to plan to optimize therapeutic regimen for BG as evidenced by report of adherence to recommended medication management changes through collaboration with PharmD and provider.   Interventions: 1:1 collaboration with [name], NP regarding development and update of comprehensive plan of care as evidenced by provider attestation and co-signature Inter-disciplinary care team collaboration (see longitudinal plan of care) Comprehensive medication review performed; medication list updated in electronic medical record  Hypertension / Heart Failure (BP goal <130/80) -Unknown control - pt was recently taken off metoprolol and has not checked BP at home since -Hx OSA, unable to tolerate CPAP -Per cardiology note, pt has CHF (combined) as well. He is on Lasix. -LVEF 55-60% (11/25/20); mild Left ventricular hypertrophy -Current home BP readings: not checking currently -Current treatment: Lisinopril 2.5 mg daily -Appropriate, Effective, Safe, Accessible Furosemide 20 mg daily -Appropriate, Effective, Safe, Accessible Isosorbide Mononitrate 60 mg daily -Appropriate, Effective, Safe, Accessible -Medications previously tried: none reported  -Educated  on BP goals and benefits of medications for prevention of heart attack, stroke and kidney damage; -Counseled to monitor BP at home a few days a week -Recommended to continue current medication   Atrial Fibrillation (Goal: prevent stroke and major bleeding) -Controlled - per patient report no symptoms, INR stable -Followed by cardiology, INR per [facility] -CHADSVASC: 5 -Current treatment: Warfarin 5 mg as directed (on hold for colonoscopy 2/27) -Appropriate, Effective, Safe, Accessible -Medications previously tried: metoprolol; no DOAC history due to cost concern -Home BP and HR readings: see HTN section -Counseled on importance of adherence to anticoagulant exactly as prescribed; avoidance of NSAIDs due to increased bleeding risk with anticoagulants; -Recommended to continue current medication  Hyperlipidemia/CAD: (LDL goal < 70) -Controlled - LDL 55 -CAD, stent Feb 2020, followed by cardiology -Pt denies any chest pain or abnormal symptoms. Affirms daily adherence.  -Current treatment: Rosuvastatin 20 mg daily - Appropriate, Effective, Safe, Accessible Clopidogrel 75 mg daily (on hold for colonoscopy 2/27) -Appropriate, Effective, Safe, Accessible Isosorbide Mononitrate 60 mg daily -Appropriate, Effective, Safe, Accessible Nitroglycerin - PRN  -Appropriate, Effective, Safe, Accessible Fish Oil 1000 mg daily -Appropriate, Effective, Safe, Accessible -Medications previously tried: none reported  -Educated on Cholesterol goals;  -Recommended to continue current medication  Diabetes (A1c goal <7%) -Not ideally controlled - pt was recently taken off metformin and Januvia due to diarrhea and elevated lipase, respectively; he reports BG has been >200; PCP has prescribed glipizide but pt has not picked it  up yet -Current home glucose readings - checks fasting 3 days per week fasting glucose: 220 -Current medications: Jardiance 10 mg daily - Appropriate, Query Effective, Safe,  Accessible Glipizide XL 10 mg daily - Appropriate, Query Effective, Safe, Accessible -Medications previously tried: metformin (diarrhea), Januvia (elevated lipase) -Advised to pick up glipizide and take as directed; call office with any BG issues (if it remains high or goes too low < 70)  COPD/OSA (Goal: control symptoms and prevent exacerbations) -Controlled -  per patient report, no issues with breathing/wheezing -No active tobacco use. Quit 2013. Low dose CT 05/06/21 normal.  -No PFTs found. No CAT score documented. -Current treatment  Symbicort 160-4.5 mcg/act - 2 puff BID (takes PRN) Albuterol PRN  -Medications previously tried: none reported -Exacerbations requiring treatment in last 6 months: none -Patient denies consistent use of maintenance inhaler. He uses Symbicort PRN -Frequency of rescue inhaler use: ~ 1-2 x/week -Counseled on Differences between maintenance and rescue inhalers -Recommended to continue current medication for now.  Depression/Insomnia (Goal: Improve mood, sleep) -Controlled -Caffeine: 2 cups coffee, drinks coke zero - 2 cans/day. No alcohol or tobacco use -PHQ9= 2 (03/2021) - no/minimal depression -Current treatment: Duloxetine 60 mg daily -Medications previously tried/failed: Lexapro 10 mg  and Bupropion 150 mg BID, citalopram -Recommended to continue current medication  Health Maintenance -Vaccine gaps: Prevnar20 or PPSV23, TDAP -Hx prostate cancer, x/p radiation and Lupron early 2020  Patient Goals/Self-Care Activities Patient will:  - take medications as prescribed check glucose 3 days per week, document, and provide at future appointments check blood pressure 3 days per week, document, and provide at future appointments       Medication Assistance: None required.  Patient affirms current coverage meets needs. (Medicare/Medicaid)  Compliance/Adherence/Medication fill history: Care Gaps: None  Star-Rating Drugs: Rosuvastatin - LF 03/04/21  x 90 ds; PDC 100% Jardiance - LF 05/16/21 x 90 ds; PDC 100% Lisinopril - LF 04/16/21 x 90 ds; PDC 99%  Patient's preferred pharmacy is:  [id number] [place] [address] [address]Phone: [phone]: [phone] [id number] [place]Phone: [phone]: [phone]  Uses pill box? Yes, girlfriend fills his pillbox weekly Pt endorses 100% compliance  We discussed: Benefits of medication synchronization, packaging and delivery as well as enhanced pharmacist oversight with [employer]. Patient decided to: Continue current medication management strategy -He would like to consider pill packs.  Care Plan and Follow Up Patient Decision:  Patient agrees to Care Plan and Follow-up.  Follow Up Plan: Telephone follow up appointment with care management team member scheduled for: 3 months  LCharlene Brooke PharmD, BCACP Clinical Pharmacist [facility]Primary [facility] at [phone]

## 2021-06-10 NOTE — Patient Instructions (Signed)
Visit Information  Phone number for Pharmacist: [phone]   Goals Addressed             This Visit's Progress    Monitor and Manage My Blood Sugar-Diabetes Type 2       Timeframe:  Long-Range Goal Priority:  Medium Start Date:         06/10/21                    Expected End Date:      06/10/22                 Follow Up Date May 2023   - check blood sugar at prescribed times - check blood sugar if I feel it is too high or too low - take the blood sugar log to all doctor visits    Why is this important?   Checking your blood sugar at home helps to keep it from getting very high or very low.  Writing the results in a diary or log helps the doctor know how to care for you.  Your blood sugar log should have the time, date and the results.  Also, write down the amount of insulin or other medicine that you take.  Other information, like what you ate, exercise done and how you were feeling, will also be helpful.     Notes:         Care Plan : CCM Pharmacy Care Plan  Updates made by Charlton Haws, RPH since 06/10/2021 12:00 AM     Problem: Hypertension, Hyperlipidemia, Diabetes, COPD, and Depression   Priority: High  Onset Date: 12/07/2020     Long-Range Goal: Disease mgmt   Start Date: 06/10/2021  Expected End Date: 06/10/2022  This Visit's Progress: On track  Priority: High  Note:   Current Barriers:  Unable to independently monitor therapeutic efficacy Unable to maintain control of blood sugar  Pharmacist Clinical Goal(s):  Patient will achieve adherence to monitoring guidelines and medication adherence to achieve therapeutic efficacy adhere to plan to optimize therapeutic regimen for BG as evidenced by report of adherence to recommended medication management changes through collaboration with PharmD and provider.   Interventions: 1:1 collaboration with [name], NP regarding development and update of comprehensive plan of care as evidenced by  provider attestation and co-signature Inter-disciplinary care team collaboration (see longitudinal plan of care) Comprehensive medication review performed; medication list updated in electronic medical record  Hypertension / Heart Failure (BP goal <130/80) -Unknown control - pt was recently taken off metoprolol and has not checked BP at home since -Hx OSA, unable to tolerate CPAP -Per cardiology note, pt has CHF (combined) as well. He is on Lasix. -LVEF 55-60% (11/25/20); mild Left ventricular hypertrophy -Current home BP readings: not checking currently -Current treatment: Lisinopril 2.5 mg daily -Appropriate, Effective, Safe, Accessible Furosemide 20 mg daily -Appropriate, Effective, Safe, Accessible Isosorbide Mononitrate 60 mg daily -Appropriate, Effective, Safe, Accessible -Medications previously tried: none reported  -Educated on BP goals and benefits of medications for prevention of heart attack, stroke and kidney damage; -Counseled to monitor BP at home a few days a week -Recommended to continue current medication   Atrial Fibrillation (Goal: prevent stroke and major bleeding) -Controlled - per patient report no symptoms, INR stable -Followed by cardiology, INR per [facility]: 5 -Current treatment: Warfarin 5 mg as directed (on hold for colonoscopy 2/27) -Appropriate, Effective, Safe, Accessible -Medications previously tried: metoprolol; no DOAC history due to cost  concern -Home BP and HR readings: see HTN section -Counseled on importance of adherence to anticoagulant exactly as prescribed; avoidance of NSAIDs due to increased bleeding risk with anticoagulants; -Recommended to continue current medication  Hyperlipidemia/CAD: (LDL goal < 70) -Controlled - LDL 55 -CAD, stent Feb 2020, followed by cardiology -Pt denies any chest pain or abnormal symptoms. Affirms daily adherence.  -Current treatment: Rosuvastatin 20 mg daily - Appropriate, Effective, Safe,  Accessible Clopidogrel 75 mg daily (on hold for colonoscopy 2/27) -Appropriate, Effective, Safe, Accessible Isosorbide Mononitrate 60 mg daily -Appropriate, Effective, Safe, Accessible Nitroglycerin - PRN  -Appropriate, Effective, Safe, Accessible Fish Oil 1000 mg daily -Appropriate, Effective, Safe, Accessible -Medications previously tried: none reported  -Educated on Cholesterol goals;  -Recommended to continue current medication  Diabetes (A1c goal <7%) -Not ideally controlled - pt was recently taken off metformin and Januvia due to diarrhea and elevated lipase, respectively; he reports BG has been >200; PCP has prescribed glipizide but pt has not picked it up yet -Current home glucose readings - checks fasting 3 days per week fasting glucose: 220 -Current medications: Jardiance 10 mg daily - Appropriate, Query Effective, Safe, Accessible Glipizide XL 10 mg daily - Appropriate, Query Effective, Safe, Accessible -Medications previously tried: metformin (diarrhea), Januvia (elevated lipase) -Advised to pick up glipizide and take as directed; call office with any BG issues (if it remains high or goes too low < 70)  COPD/OSA (Goal: control symptoms and prevent exacerbations) -Controlled -  per patient report, no issues with breathing/wheezing -No active tobacco use. Quit 2013. Low dose CT 05/06/21 normal.  -No PFTs found. No CAT score documented. -Current treatment  Symbicort 160-4.5 mcg/act - 2 puff BID (takes PRN) Albuterol PRN  -Medications previously tried: none reported -Exacerbations requiring treatment in last 6 months: none -Patient denies consistent use of maintenance inhaler. He uses Symbicort PRN -Frequency of rescue inhaler use: ~ 1-2 x/week -Counseled on Differences between maintenance and rescue inhalers -Recommended to continue current medication for now.  Depression/Insomnia (Goal: Improve mood, sleep) -Controlled -Caffeine: 2 cups coffee, drinks coke zero - 2  cans/day. No alcohol or tobacco use -PHQ9= 2 (03/2021) - no/minimal depression -Current treatment: Duloxetine 60 mg daily -Medications previously tried/failed: Lexapro 10 mg  and Bupropion 150 mg BID, citalopram -Recommended to continue current medication  Health Maintenance -Vaccine gaps: Prevnar20 or PPSV23, TDAP -Hx prostate cancer, x/p radiation and Lupron early 2020  Patient Goals/Self-Care Activities Patient will:  - take medications as prescribed check glucose 3 days per week, document, and provide at future appointments check blood pressure 3 days per week, document, and provide at future appointments      Patient verbalizes understanding of instructions and care plan provided today and agrees to view in [place]. Active MyChart status confirmed with patient.   Telephone follow up appointment with pharmacy team member scheduled for: 3 months  Charlene Brooke, PharmD, [facility] Clinical Pharmacist [facility] at [facility] [phone]

## 2021-06-12 ENCOUNTER — Other Ambulatory Visit: Payer: Self-pay | Admitting: Primary Care

## 2021-06-12 DIAGNOSIS — J439 Emphysema, unspecified: Secondary | ICD-10-CM

## 2021-06-13 ENCOUNTER — Ambulatory Visit: Payer: Medicare Other | Admitting: Anesthesiology

## 2021-06-13 ENCOUNTER — Encounter: Payer: Self-pay | Admitting: Gastroenterology

## 2021-06-13 ENCOUNTER — Encounter
Admission: RE | Disposition: A | Discharge status: Home / Self Care | Payer: Self-pay | Source: Home / Self Care | Attending: Gastroenterology

## 2021-06-13 ENCOUNTER — Ambulatory Visit
Admission: RE | Admit: 2021-06-13 | Discharge: 2021-06-13 | Disposition: A | Discharge status: Home / Self Care | Payer: Medicare Other | Attending: Gastroenterology | Admitting: Gastroenterology

## 2021-06-13 DIAGNOSIS — I252 Old myocardial infarction: Secondary | ICD-10-CM | POA: Diagnosis not present

## 2021-06-13 DIAGNOSIS — J449 Chronic obstructive pulmonary disease, unspecified: Secondary | ICD-10-CM | POA: Insufficient documentation

## 2021-06-13 DIAGNOSIS — Z7901 Long term (current) use of anticoagulants: Secondary | ICD-10-CM | POA: Diagnosis not present

## 2021-06-13 DIAGNOSIS — Z87891 Personal history of nicotine dependence: Secondary | ICD-10-CM | POA: Insufficient documentation

## 2021-06-13 DIAGNOSIS — K635 Polyp of colon: Secondary | ICD-10-CM | POA: Diagnosis not present

## 2021-06-13 DIAGNOSIS — D122 Benign neoplasm of ascending colon: Secondary | ICD-10-CM | POA: Insufficient documentation

## 2021-06-13 DIAGNOSIS — K5730 Diverticulosis of large intestine without perforation or abscess without bleeding: Secondary | ICD-10-CM | POA: Insufficient documentation

## 2021-06-13 DIAGNOSIS — Z86010 Personal history of colonic polyps: Secondary | ICD-10-CM

## 2021-06-13 DIAGNOSIS — E119 Type 2 diabetes mellitus without complications: Secondary | ICD-10-CM | POA: Insufficient documentation

## 2021-06-13 DIAGNOSIS — Z950 Presence of cardiac pacemaker: Secondary | ICD-10-CM | POA: Insufficient documentation

## 2021-06-13 DIAGNOSIS — Z7984 Long term (current) use of oral hypoglycemic drugs: Secondary | ICD-10-CM | POA: Diagnosis not present

## 2021-06-13 DIAGNOSIS — G4730 Sleep apnea, unspecified: Secondary | ICD-10-CM | POA: Diagnosis not present

## 2021-06-13 DIAGNOSIS — Z6829 Body mass index (BMI) 29.0-29.9, adult: Secondary | ICD-10-CM | POA: Diagnosis not present

## 2021-06-13 DIAGNOSIS — I2510 Atherosclerotic heart disease of native coronary artery without angina pectoris: Secondary | ICD-10-CM | POA: Diagnosis not present

## 2021-06-13 DIAGNOSIS — M479 Spondylosis, unspecified: Secondary | ICD-10-CM | POA: Insufficient documentation

## 2021-06-13 DIAGNOSIS — R197 Diarrhea, unspecified: Secondary | ICD-10-CM | POA: Diagnosis not present

## 2021-06-13 DIAGNOSIS — I119 Hypertensive heart disease without heart failure: Secondary | ICD-10-CM | POA: Insufficient documentation

## 2021-06-13 DIAGNOSIS — E785 Hyperlipidemia, unspecified: Secondary | ICD-10-CM | POA: Insufficient documentation

## 2021-06-13 DIAGNOSIS — Z1211 Encounter for screening for malignant neoplasm of colon: Secondary | ICD-10-CM | POA: Diagnosis not present

## 2021-06-13 DIAGNOSIS — Z955 Presence of coronary angioplasty implant and graft: Secondary | ICD-10-CM | POA: Insufficient documentation

## 2021-06-13 DIAGNOSIS — D125 Benign neoplasm of sigmoid colon: Secondary | ICD-10-CM | POA: Diagnosis not present

## 2021-06-13 DIAGNOSIS — M170 Bilateral primary osteoarthritis of knee: Secondary | ICD-10-CM | POA: Insufficient documentation

## 2021-06-13 DIAGNOSIS — K219 Gastro-esophageal reflux disease without esophagitis: Secondary | ICD-10-CM | POA: Diagnosis not present

## 2021-06-13 DIAGNOSIS — I429 Cardiomyopathy, unspecified: Secondary | ICD-10-CM | POA: Insufficient documentation

## 2021-06-13 DIAGNOSIS — E669 Obesity, unspecified: Secondary | ICD-10-CM | POA: Diagnosis not present

## 2021-06-13 DIAGNOSIS — I4819 Other persistent atrial fibrillation: Secondary | ICD-10-CM | POA: Insufficient documentation

## 2021-06-13 HISTORY — PX: COLONOSCOPY WITH PROPOFOL: SHX5780

## 2021-06-13 LAB — GLUCOSE, CAPILLARY: Glucose-Capillary: 179 mg/dL — ABNORMAL HIGH (ref 70–99)

## 2021-06-13 SURGERY — COLONOSCOPY WITH PROPOFOL
Anesthesia: General

## 2021-06-13 MED ORDER — PROPOFOL 500 MG/50ML IV EMUL
INTRAVENOUS | Status: DC | PRN
  Administered 2021-06-13: 160 ug/kg/min via INTRAVENOUS

## 2021-06-13 MED ORDER — DEXAMETHASONE SODIUM PHOSPHATE 10 MG/ML IJ SOLN
INTRAMUSCULAR | Status: DC | PRN
Start: 2021-06-13 — End: 2021-06-13
  Administered 2021-06-13: 5 mg via INTRAVENOUS

## 2021-06-13 MED ORDER — SODIUM CHLORIDE 0.9 % IV SOLN: INTRAVENOUS | Status: DC

## 2021-06-13 MED ORDER — PROPOFOL 10 MG/ML IV BOLUS
INTRAVENOUS | Status: DC | PRN
  Administered 2021-06-13: 20 mg via INTRAVENOUS
  Administered 2021-06-13: 60 mg via INTRAVENOUS

## 2021-06-13 MED ORDER — ONDANSETRON HCL 4 MG/2ML IJ SOLN
INTRAMUSCULAR | Status: DC | PRN
  Administered 2021-06-13: 4 mg via INTRAVENOUS

## 2021-06-13 MED ORDER — GLYCOPYRROLATE 0.2 MG/ML IJ SOLN
INTRAMUSCULAR | Status: DC | PRN
  Administered 2021-06-13: .2 mg via INTRAVENOUS

## 2021-06-13 NOTE — Transfer of Care (Signed)
Immediate Anesthesia Transfer of Care Note  Patient: [name] Cook  Procedure(s) Performed: COLONOSCOPY WITH PROPOFOL  Patient Location: PACU and Endoscopy Unit  Anesthesia Type:General  Level of Consciousness: drowsy and patient cooperative  Airway & Oxygen Therapy: Patient Spontanous Breathing  Post-op Assessment: Report given to RN and Post -op Vital signs reviewed and stable  Post vital signs: Reviewed and stable  Last Vitals:  Vitals Value Taken Time  BP 96/65 06/13/21 1239  Temp    Pulse 70 06/13/21 1239  Resp 18 06/13/21 1239  SpO2 93 % 06/13/21 1239  Vitals shown include unvalidated device data.  Last Pain:  Vitals:   06/13/21 1026  TempSrc: Temporal  PainSc: 0-No pain         Complications: No notable events documented.

## 2021-06-13 NOTE — Anesthesia Preprocedure Evaluation (Signed)
Anesthesia Evaluation  Patient identified by MRN, date of birth, ID band Patient awake    Reviewed: Allergy & Precautions, NPO status , Patient's Chart, lab work & pertinent test results  History of Anesthesia Complications Negative for: history of anesthetic complications  Airway Mallampati: III  TM Distance: >3 FB Neck ROM: full    Dental  (+) Upper Dentures, Lower Dentures   Pulmonary neg shortness of breath, sleep apnea (not using CPAP) , COPD (no inhalers), former smoker,    Pulmonary exam normal        Cardiovascular hypertension, Pt. on medications + CAD and + Past MI  (-) CHF Normal cardiovascular exam+ dysrhythmias Atrial Fibrillation + pacemaker      Neuro/Psych neg Seizures PSYCHIATRIC DISORDERS Depression negative neurological ROS     GI/Hepatic Neg liver ROS, GERD  Medicated and Controlled,  Endo/Other  diabetes, Type 2, Oral Hypoglycemic Agents  Renal/GU Renal InsufficiencyRenal disease  negative genitourinary   Musculoskeletal  (+) Arthritis , Osteoarthritis,    Abdominal   Peds negative pediatric ROS (+)  Hematology negative hematology ROS (+)   Anesthesia Other Findings Past Medical History: No date: Arthritis     Comment:  "knees and lower back" (03/14/2013) No date: Atrial flutter (HCC)     Comment:  radiofrequency ablation in 2001 No date: CAD (coronary artery disease)     Comment:  a. Nonobstructive. Cardiac cath in 2001-50% mid RI,               normal LM, LAD, RCA b. cath 10/16/2014 95% mid RCA treated               with DES, 99% ost D1 medical management due to small               aneurysmal segment No date: Chronic anticoagulation     Comment:  chronic Coumadin anticoagulation 04/20/2011: Chronic obstructive pulmonary disease (HCC) No date: Diabetes mellitus, type 2 (HCC) No date: GERD (gastroesophageal reflux disease) No date: Hyperlipidemia No date: Hypertension     Comment:  with  hypertensive heart disease 10/25/2017: Left knee pain     Comment:  medial No date: Obesity No date: Persistent atrial fibrillation ([place])     Comment:  recurrent atrial flutter since 2001 s/p DCCVs, multiple               failed AADs, h/o tachy-mediated cardiomyopathy No date: Shortness of breath     Comment:  "can come on at any time" (03/14/2013) No date: Sleep apnea     Comment:  "dx'd; couldn't wear the mask" (03/14/2013) No date: Tobacco abuse   Reproductive/Obstetrics                             Anesthesia Physical  Anesthesia Plan  ASA: III  Anesthesia Plan: General   Post-op Pain Management:    Induction: Intravenous  PONV Risk Score and Plan: TIVA and Propofol infusion  Airway Management Planned: Nasal Cannula  Additional Equipment:   Intra-op Plan:   Post-operative Plan:   Informed Consent: I have reviewed the patients History and Physical, chart, labs and discussed the procedure including the risks, benefits and alternatives for the proposed anesthesia with the patient or authorized representative who has indicated his/her understanding and acceptance.     Dental Advisory Given  Plan Discussed with: CRNA  Anesthesia Plan Comments: (Patient consented for risks of anesthesia including but not limited to:  - adverse  reactions to medications - risk of airway placement if required - damage to eyes, teeth, lips or other oral mucosa - nerve damage due to positioning  - sore throat or hoarseness - Damage to heart, brain, nerves, lungs, other parts of body or loss of life  Patient voiced understanding.)        Anesthesia Quick Evaluation

## 2021-06-13 NOTE — H&P (Signed)
Jonathon Bellows, MD [address], [place], [place], [place], [address] [address], [place], [place], [place], [address] Phone: [phone]  Fax: [phone]  Primary Care Physician:  [name], NP   Pre-Procedure History & Physical: HPI:  [name] Cook is a 71 y.o. male is here for an colonoscopy.   Past Medical History:  Diagnosis Date   Arthritis    "knees and lower back" (03/14/2013)   Atrial flutter ([place])    radiofrequency ablation in 2001   CAD (coronary artery disease)    a. Nonobstructive. Cardiac cath in 2001-50% mid RI, normal LM, LAD, RCA b. cath 10/16/2014 95% mid RCA treated with DES, 99% ost D1 medical management due to small aneurysmal segment   Chronic anticoagulation    chronic Coumadin anticoagulation   Chronic obstructive pulmonary disease ([place]) 04/20/2011   Diabetes mellitus, type 2 (HCC)    GERD (gastroesophageal reflux disease)    Hyperlipidemia    Hypertension    with hypertensive heart disease   Left knee pain 10/25/2017   medial   Obesity    Persistent atrial fibrillation ([place])    recurrent atrial flutter since 2001 s/p DCCVs, multiple failed AADs, h/o tachy-mediated cardiomyopathy   Shortness of breath    "can come on at any time" (03/14/2013)   Sleep apnea    "dx'd; couldn't wear the mask" (03/14/2013)   Tobacco abuse     Past Surgical History:  Procedure Laterality Date   ATRIAL FLUTTER ABLATION  2002   atrial flutter; subsequently developed atrial fibrillation   AV NODE ABLATION  01/24/2013   CARDIAC CATHETERIZATION  2002   CARDIAC CATHETERIZATION N/A 10/16/2014   Procedure: Left Heart Cath and Coronary Angiography;  Surgeon: [name], MD;  Location: [facility];  Service: Cardiovascular;  Laterality: N/A;   CARDIOVERSION  05/31/2011   Procedure: CARDIOVERSION;  Surgeon: Cristopher Estimable. Lattie Haw, MD;  Location: [facility];  Service: Cardiovascular;  Laterality: N/A;   CARPAL TUNNEL RELEASE Left 1980's   COLONOSCOPY WITH PROPOFOL N/A  12/30/2018   Procedure: COLONOSCOPY WITH PROPOFOL;  Surgeon: Jonathon Bellows, MD;  Location: [facility];  Service: Gastroenterology;  Laterality: N/A;   COLONOSCOPY WITH PROPOFOL N/A 12/04/2019   Procedure: COLONOSCOPY WITH PROPOFOL;  Surgeon: Jonathon Bellows, MD;  Location: [facility] ENDOSCOPY;  Service: Gastroenterology;  Laterality: N/A;   INSERT / REPLACE / REMOVE PACEMAKER  01/24/2013    Medtronic Adapta L dual-chamber pacemaker, serial number [id number]    LEFT HEART CATH AND CORONARY ANGIOGRAPHY N/A 06/07/2018   Procedure: LEFT HEART CATH AND CORONARY ANGIOGRAPHY;  Surgeon: [name], MD;  Location: [facility];  Service: Cardiovascular;  Laterality: N/A;   LEFT HEART CATHETERIZATION WITH CORONARY ANGIOGRAM N/A 03/17/2013   Procedure: LEFT HEART CATHETERIZATION WITH CORONARY ANGIOGRAM;  Surgeon: Burnell Blanks, MD; LAD mild dz, D1 branch 100%, inferior branch 99%, CFX OK, RCA 50%, EF 65%     LOOP RECORDER IMPLANT  2002   PERMANENT PACEMAKER INSERTION N/A 01/24/2013   Procedure: PERMANENT PACEMAKER INSERTION;  Surgeon: [name], MD;  Location: [facility] CATH LAB;  Service: Cardiovascular;  Laterality: N/A;   TIBIAL TUBERCLERPLASTY  ~ 2003    Prior to Admission medications   Medication Sig Start Date End Date Taking? Authorizing Provider  budesonide-formoterol (SYMBICORT) 160-4.5 MCG/ACT inhaler INHALE 2 PUFFS INTO THE LUNGS TWICE A DAY 01/12/21  Yes [name], NP  DULoxetine (CYMBALTA) 60 MG capsule TAKE 1 CAPSULE (60 MG TOTAL) BY MOUTH  DAILY. FOR DEPRESSION. 05/16/21  Yes [name], NP  empagliflozin (JARDIANCE) 10 MG TABS tablet TAKE 1 TABLET BY MOUTH EVERY MORNING FOR DIABETES 11/09/20  Yes [name], NP  fish oil-omega-3 fatty acids 1000 MG capsule Take 1 g by mouth daily.     Yes [provider]  fluticasone (FLONASE) 50 MCG/ACT nasal spray PLACE 1 SPRAY INTO BOTH NOSTRILS DAILY AS NEEDED FOR ALLERGIES OR RHINITIS. 01/22/20  Yes [place], Modena Nunnery, MD  furosemide (LASIX) 20 MG tablet TAKE 1 TABLET BY MOUTH EVERY DAY 07/21/20  Yes [name], MD  gabapentin (NEURONTIN) 300 MG capsule Take 1 capsule (300 mg total) by mouth 2 (two) times daily. For neuropathy 10/07/20  Yes [name], NP  glipiZIDE (GLIPIZIDE XL) 10 MG 24 hr tablet Take 1 tablet (10 mg total) by mouth daily with breakfast. For diabetes. 06/08/21  Yes [name], NP  isosorbide mononitrate (IMDUR) 60 MG 24 hr tablet TAKE 1 TABLET BY MOUTH EVERY DAY 07/21/20  Yes [name], MD  lisinopril (ZESTRIL) 2.5 MG tablet TAKE 1 TABLET BY MOUTH EVERY DAY 07/14/20  Yes [name], MD  pantoprazole (PROTONIX) 40 MG tablet TAKE 1 TABLET (40 MG TOTAL) BY MOUTH DAILY FOR HEARTBURN. 10/26/20  Yes [name], NP  rosuvastatin (CRESTOR) 20 MG tablet TAKE 1 TABLET BY MOUTH EVERY DAY IN THE EVENING FOR CHOLESTEROL 03/04/21  Yes [name], NP  tamsulosin (FLOMAX) 0.4 MG CAPS capsule TAKE 1 CAPSULE (0.4 MG TOTAL) BY MOUTH 2 (TWO) TIMES DAILY. 01/12/21  Yes [place], Eulas Post, MD  albuterol (VENTOLIN HFA) 108 (90 Base) MCG/ACT inhaler INHALE 2 PUFFS BY MOUTH EVERY 4 HOURS AS NEEDED FOR WHEEZE OR FOR SHORTNESS OF BREATH 05/16/21   [name], NP  blood glucose meter kit and supplies KIT Dispense based on patient and insurance preference. Use up to four times daily as directed. (FOR ICD-9 250.00, 250.01). 04/08/21   [name], NP  clopidogrel (PLAVIX) 75 MG tablet TAKE 1 TABLET BY MOUTH EVERY DAY 07/21/20   [name], MD  diclofenac Sodium (VOLTAREN) 1 % GEL Apply 2 g topically 4 (four) times daily. 07/17/19   Khatri, Hina, PA-C  glucose blood test strip 1 each by Other route as needed for other. Use to check blood sugar up to three times a day-Accu-Chek meter    [provider]  nitroGLYCERIN (NITROSTAT) 0.4 MG SL tablet Place 1 tablet (0.4 mg total) under the tongue every 5 (five) minutes as needed for chest pain. 04/08/21   [name], NP  warfarin (COUMADIN) 5 MG tablet TAKE 1/2 TABLET DAILY BY MOUTH EXCEPT TAKE 1 TABLET ON MONDAYS AND THURSDAYS OR AS DIRECTED BY ANTICOAGULATION CLINIC 04/15/21   [name], NP    Allergies as of 06/03/2021   (No Known Allergies)    Family History  Problem Relation Age of Onset   Alzheimer's disease Mother    Osteoporosis Mother     Social History   Socioeconomic History   Marital status: Widowed    Spouse name: Not on file   Number of children: 1   Years of education: Not on file   Highest education level: Not on file  Occupational History   Occupation: Unemployed    Employer: UNEMPLOYED  Tobacco Use   Smoking status: Former    Packs/day: 1.00    Years: 42.00    Pack years: 42.00    Types:  Cigarettes    Quit date: 12/30/2013    Years since quitting: 7.4   Smokeless tobacco: Never  Vaping Use   Vaping Use: Never used  Substance and Sexual Activity   Alcohol use: Not Currently    Comment: 03/14/2013 "stopped drinking back in 2002; never had problem w/it"   Drug use: No   Sexual activity: Not Currently  Other Topics Concern   Not on file  Social History Narrative   Single.   Retired.    1 son, deceased.    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   Enjoys playing on the computer.       Social Determinants of Health   Financial Resource Strain: Low Risk    Difficulty of Paying Living Expenses: Not very hard  Food Insecurity: No Food Insecurity   Worried About Charity fundraiser in the Last Year: Never true   Ran Out of Food in the Last Year: Never true  Transportation Needs: No Transportation Needs   Lack of Transportation (Medical): No   Lack of Transportation (Non-Medical): No  Physical Activity: Inactive   Days of Exercise per Week: 0 days   Minutes of Exercise per Session: 0 min  Stress: No Stress Concern Present   Feeling of Stress : Not at all  Social Connections: Not on file  Intimate Partner  Violence: Not At Risk   Fear of Current or Ex-Partner: No   Emotionally Abused: No   Physically Abused: No   Sexually Abused: No    Review of Systems: See HPI, otherwise negative ROS  Physical Exam: BP 110/69    Pulse 73    Temp (!) 96.2 F (35.7 C) (Temporal)    Resp 17    Ht 5' 11" (1.803 m)    Wt 95.3 kg    SpO2 99%    BMI 29.29 kg/m  General:   Alert,  pleasant and cooperative in NAD Head:  Normocephalic and atraumatic. Neck:  Supple; no masses or thyromegaly. Lungs:  Clear throughout to auscultation, normal respiratory effort.    Heart:  +S1, +S2, Regular rate and rhythm, No edema. Abdomen:  Soft, nontender and nondistended. Normal bowel sounds, without guarding, and without rebound.   Neurologic:  Alert and  oriented x4;  grossly normal neurologically.  Impression/Plan: JAVARIE CRISP is here for an colonoscopy to be performed for surveillance due to prior history of colon polyps   Risks, benefits, limitations, and alternatives regarding  colonoscopy have been reviewed with the patient.  Questions have been answered.  All parties agreeable.   Jonathon Bellows, MD  06/13/2021, 11:53 AM

## 2021-06-13 NOTE — Anesthesia Postprocedure Evaluation (Signed)
Anesthesia Post Note  Patient: [name] Cook  Procedure(s) Performed: COLONOSCOPY WITH PROPOFOL  Patient location during evaluation: Endoscopy Anesthesia Type: General Level of consciousness: awake and alert Pain management: pain level controlled Vital Signs Assessment: post-procedure vital signs reviewed and stable Respiratory status: spontaneous breathing, nonlabored ventilation, respiratory function stable and patient connected to nasal cannula oxygen Cardiovascular status: blood pressure returned to baseline and stable Postop Assessment: no apparent nausea or vomiting Anesthetic complications: no   No notable events documented.   Last Vitals:  Vitals:   06/13/21 1248 06/13/21 1303  BP: 111/68 121/79  Pulse:    Resp:    Temp:    SpO2:      Last Pain:  Vitals:   06/13/21 1248  TempSrc:   PainSc: 0-No pain                 Precious Haws Iven Earnhart

## 2021-06-13 NOTE — Op Note (Signed)
[facility] Gastroenterology Patient Name: [name] Cook Procedure Date: 06/13/2021 11:55 AM MRN: [id number] Account #: [id number] Date of Birth: 06-Apr-1951 Admit Type: Outpatient Age: 71 Room: [facility] ENDO ROOM 4 Gender: Male Note Status: Finalized Instrument Name: Jasper Riling [id number] Procedure:             Colonoscopy Indications:           Surveillance: Personal history of adenomatous polyps                         on last colonoscopy 3 years ago Providers:             Jonathon Bellows MD, MD Referring MD:          [name] (Referring MD) Medicines:             Monitored Anesthesia Care Complications:         No immediate complications. Procedure:             Pre-Anesthesia Assessment:                        - Prior to the procedure, a History and Physical was                         performed, and patient medications, allergies and                         sensitivities were reviewed. The patient's tolerance                         of previous anesthesia was reviewed.                        - The risks and benefits of the procedure and the                         sedation options and risks were discussed with the                         patient. All questions were answered and informed                         consent was obtained.                        - ASA Grade Assessment: II - A patient with mild                         systemic disease.                        After obtaining informed consent, the colonoscope was                         passed under direct vision. Throughout the procedure,                         the patient's blood pressure, pulse, and oxygen                         saturations  were monitored continuously. The                         Colonoscope was introduced through the anus and                         advanced to the the cecum, identified by the                         appendiceal orifice. The colonoscopy was performed                          with ease. The patient tolerated the procedure well.                         The quality of the bowel preparation was good. Findings:      The perianal and digital rectal examinations were normal.      A 10 mm polyp was found in the distal ascending colon. The polyp was       sessile. The polyp was removed with a cold snare. Resection and       retrieval were complete.      A 5 mm polyp was found in the sigmoid colon. The polyp was sessile. The       polyp was removed with a cold snare. Resection and retrieval were       complete.      Normal mucosa was found in the entire colon. Biopsies were taken with a       cold forceps for histology.      Multiple small and large-mouthed diverticula were found in the sigmoid       colon.      The exam was otherwise without abnormality on direct and retroflexion       views. Impression:            - One 10 mm polyp in the distal ascending colon,                         removed with a cold snare. Resected and retrieved.                        - One 5 mm polyp in the sigmoid colon, removed with a                         cold snare. Resected and retrieved.                        - Normal mucosa in the entire examined colon. Biopsied.                        - Diverticulosis in the sigmoid colon.                        - The examination was otherwise normal on direct and                         retroflexion views. Recommendation:        - Discharge patient to home (with escort).                        -  Resume previous diet.                        - Continue present medications.                        - Await pathology results.                        - Repeat colonoscopy for surveillance based on                         pathology results. Procedure Code(s):     --- Professional ---                        [phone], Colonoscopy, flexible; with removal of                         tumor(s), polyp(s), or other lesion(s) by snare                          technique                        [id number], 36, Colonoscopy, flexible; with biopsy, single                         or multiple CPT copyright 2019 American Medical Association. All rights reserved. The codes documented in this report are preliminary and upon coder review may  be revised to meet current compliance requirements. Jonathon Bellows, MD Jonathon Bellows MD, MD 06/13/2021 12:36:36 PM This report has been signed electronically. Number of Addenda: 0 Note Initiated On: 06/13/2021 11:55 AM Scope Withdrawal Time: 0 hours 22 minutes 6 seconds  Total Procedure Duration: 0 hours 27 minutes 53 seconds  Estimated Blood Loss:  Estimated blood loss: none.      [facility]

## 2021-06-13 NOTE — Anesthesia Procedure Notes (Signed)
Date/Time: 06/13/2021 12:06 PM Performed by: Demetrius Charity, CRNA Pre-anesthesia Checklist: Patient identified, Patient being monitored, Timeout performed, Emergency Drugs available and Suction available Patient Re-evaluated:Patient Re-evaluated prior to induction Oxygen Delivery Method: Nasal cannula Dental Injury: Teeth and Oropharynx as per pre-operative assessment

## 2021-06-14 ENCOUNTER — Encounter: Payer: Self-pay | Admitting: Gastroenterology

## 2021-06-14 DIAGNOSIS — I10 Essential (primary) hypertension: Secondary | ICD-10-CM

## 2021-06-14 DIAGNOSIS — E1165 Type 2 diabetes mellitus with hyperglycemia: Secondary | ICD-10-CM

## 2021-06-14 DIAGNOSIS — E785 Hyperlipidemia, unspecified: Secondary | ICD-10-CM

## 2021-06-14 DIAGNOSIS — F3341 Major depressive disorder, recurrent, in partial remission: Secondary | ICD-10-CM

## 2021-06-14 DIAGNOSIS — I4819 Other persistent atrial fibrillation: Secondary | ICD-10-CM | POA: Diagnosis not present

## 2021-06-14 LAB — SURGICAL PATHOLOGY

## 2021-06-14 NOTE — Progress Notes (Signed)
06/15/2021 11:08 AM   [name] Cook May 19, 1950 [id number]  Referring provider: [name], NP [address],  [place] [address]  Chief Complaint  Patient presents with   Erectile Dysfunction   Prostate Cancer   Urological history: 1. ED -contributing factors of age, CAD, sleep apnea, COPD, history of smoking, HTN, HLD and diabetes - not a candidate for PDE5i's secondary due nitrate use  2. Prostate cancer -PSA pending -cT2 prostate cancer, high-volume intermediate risk involving right-sided gland only -treatment with EBRT and short-term Lupron.  He received his dose in 06/2018.  He completed radiation in 08/2018.  HPI: [name] Cook is a 71 y.o. male who presents today for follow up.   He was scheduled for IPP, but his insurance would not cover the procedure.  He is scheduled to have ICI titration with Dr. Junious Silk on 06/23/2021.    He has not issues with issues with urination.  Patient denies any modifying or aggravating factors.  Patient denies any gross hematuria, dysuria or suprapubic/flank pain.  Patient denies any fevers, chills, nausea or vomiting.    PMH: Past Medical History:  Diagnosis Date   Arthritis    "knees and lower back" (03/14/2013)   Atrial flutter ([place])    radiofrequency ablation in 2001   CAD (coronary artery disease)    a. Nonobstructive. Cardiac cath in 2001-50% mid RI, normal LM, LAD, RCA b. cath 10/16/2014 95% mid RCA treated with DES, 99% ost D1 medical management due to small aneurysmal segment   Chronic anticoagulation    chronic Coumadin anticoagulation   Chronic obstructive pulmonary disease ([place]) 04/20/2011   Diabetes mellitus, type 2 (HCC)    GERD (gastroesophageal reflux disease)    Hyperlipidemia    Hypertension    with hypertensive heart disease   Left knee pain 10/25/2017   medial   Obesity    Persistent atrial fibrillation ([place])    recurrent atrial flutter since 2001 s/p DCCVs, multiple failed AADs, h/o tachy-mediated  cardiomyopathy   Shortness of breath    "can come on at any time" (03/14/2013)   Sleep apnea    "dx'd; couldn't wear the mask" (03/14/2013)   Tobacco abuse     Surgical History: Past Surgical History:  Procedure Laterality Date   ATRIAL FLUTTER ABLATION  2002   atrial flutter; subsequently developed atrial fibrillation   AV NODE ABLATION  01/24/2013   CARDIAC CATHETERIZATION  2002   CARDIAC CATHETERIZATION N/A 10/16/2014   Procedure: Left Heart Cath and Coronary Angiography;  Surgeon: [name], MD;  Location: [place] [facility];  Service: Cardiovascular;  Laterality: N/A;   CARDIOVERSION  05/31/2011   Procedure: CARDIOVERSION;  Surgeon: Cristopher Estimable. Lattie Haw, MD;  Location: [facility];  Service: Cardiovascular;  Laterality: N/A;   CARPAL TUNNEL RELEASE Left 1980's   COLONOSCOPY WITH PROPOFOL N/A 12/30/2018   Procedure: COLONOSCOPY WITH PROPOFOL;  Surgeon: Jonathon Bellows, MD;  Location: [facility] ENDOSCOPY;  Service: Gastroenterology;  Laterality: N/A;   COLONOSCOPY WITH PROPOFOL N/A 12/04/2019   Procedure: COLONOSCOPY WITH PROPOFOL;  Surgeon: Jonathon Bellows, MD;  Location: [facility] ENDOSCOPY;  Service: Gastroenterology;  Laterality: N/A;   COLONOSCOPY WITH PROPOFOL N/A 06/13/2021   Procedure: COLONOSCOPY WITH PROPOFOL;  Surgeon: Jonathon Bellows, MD;  Location: [facility] ENDOSCOPY;  Service: Gastroenterology;  Laterality: N/A;   INSERT / REPLACE / REMOVE PACEMAKER  01/24/2013    Medtronic Adapta L dual-chamber pacemaker, serial number [id number]    LEFT HEART CATH AND CORONARY ANGIOGRAPHY N/A  06/07/2018   Procedure: LEFT HEART CATH AND CORONARY ANGIOGRAPHY;  Surgeon: [name], MD;  Location: [place] [facility];  Service: Cardiovascular;  Laterality: N/A;   LEFT HEART CATHETERIZATION WITH CORONARY ANGIOGRAM N/A 03/17/2013   Procedure: LEFT HEART CATHETERIZATION WITH CORONARY ANGIOGRAM;  Surgeon: Burnell Blanks, MD; LAD mild dz, D1 branch 100%, inferior branch 99%, CFX OK, RCA 50%, EF 65%     LOOP  RECORDER IMPLANT  2002   PERMANENT PACEMAKER INSERTION N/A 01/24/2013   Procedure: PERMANENT PACEMAKER INSERTION;  Surgeon: [name], MD;  [facility];  Service: Cardiovascular;  Laterality: N/A;   TIBIAL TUBERCLERPLASTY  ~ 2003    Home Medications:  Allergies as of 06/15/2021   No Known Allergies      Medication List        Accurate as of June 15, 2021 11:08 AM. If you have any questions, ask your nurse or doctor.          albuterol 108 (90 Base) MCG/ACT inhaler Commonly known as: VENTOLIN HFA INHALE 2 PUFFS BY MOUTH EVERY 4 HOURS AS NEEDED FOR WHEEZE OR FOR SHORTNESS OF BREATH   blood glucose meter kit and supplies Kit Dispense based on patient and insurance preference. Use up to four times daily as directed. (FOR ICD-9 250.00, 250.01).   clopidogrel 75 MG tablet Commonly known as: PLAVIX TAKE 1 TABLET BY MOUTH EVERY DAY   diclofenac Sodium 1 % Gel Commonly known as: Voltaren Apply 2 g topically 4 (four) times daily.   DULoxetine 60 MG capsule Commonly known as: CYMBALTA TAKE 1 CAPSULE (60 MG TOTAL) BY MOUTH DAILY. FOR DEPRESSION.   fish oil-omega-3 fatty acids 1000 MG capsule Take 1 g by mouth daily.   fluticasone 50 MCG/ACT nasal spray Commonly known as: FLONASE PLACE 1 SPRAY INTO BOTH NOSTRILS DAILY AS NEEDED FOR ALLERGIES OR RHINITIS.   furosemide 20 MG tablet Commonly known as: LASIX TAKE 1 TABLET BY MOUTH EVERY DAY   gabapentin 300 MG capsule Commonly known as: NEURONTIN Take 1 capsule (300 mg total) by mouth 2 (two) times daily. For neuropathy   glipiZIDE 10 MG 24 hr tablet Commonly known as: glipiZIDE XL Take 1 tablet (10 mg total) by mouth daily with breakfast. For diabetes.   glucose blood test strip 1 each by Other route as needed for other. Use to check blood sugar up to three times a day-Accu-Chek meter   isosorbide mononitrate 60 MG 24 hr tablet Commonly known as: IMDUR TAKE 1 TABLET BY MOUTH EVERY DAY   Jardiance 10 MG  Tabs tablet Generic drug: empagliflozin TAKE 1 TABLET BY MOUTH EVERY MORNING FOR DIABETES   lisinopril 2.5 MG tablet Commonly known as: ZESTRIL TAKE 1 TABLET BY MOUTH EVERY DAY   nitroGLYCERIN 0.4 MG SL tablet Commonly known as: NITROSTAT Place 1 tablet (0.4 mg total) under the tongue every 5 (five) minutes as needed for chest pain.   pantoprazole 40 MG tablet Commonly known as: PROTONIX TAKE 1 TABLET (40 MG TOTAL) BY MOUTH DAILY FOR HEARTBURN.   rosuvastatin 20 MG tablet Commonly known as: CRESTOR TAKE 1 TABLET BY MOUTH EVERY DAY IN THE EVENING FOR CHOLESTEROL   Symbicort 160-4.5 MCG/ACT inhaler Generic drug: budesonide-formoterol INHALE 2 PUFFS INTO THE LUNGS TWICE A DAY   tamsulosin 0.4 MG Caps capsule Commonly known as: FLOMAX TAKE 1 CAPSULE (0.4 MG TOTAL) BY MOUTH 2 (TWO) TIMES DAILY.   warfarin 5 MG tablet Commonly known as: COUMADIN Take as directed by  the anticoagulation clinic. If you are unsure how to take this medication, talk to your nurse or doctor. Original instructions: TAKE 1/2 TABLET DAILY BY MOUTH EXCEPT TAKE 1 TABLET ON MONDAYS AND THURSDAYS OR AS DIRECTED BY ANTICOAGULATION CLINIC        Allergies: No Known Allergies  Family History: Family History  Problem Relation Age of Onset   Alzheimer's disease Mother    Osteoporosis Mother     Social History:  reports that he quit smoking about 7 years ago. His smoking use included cigarettes. He has a 42.00 pack-year smoking history. He has never used smokeless tobacco. He reports that he does not currently use alcohol. He reports that he does not use drugs.  ROS: Pertinent ROS in HPI  Physical Exam: BP 113/62    Pulse 79    Ht _0  (1.803 m)    Wt 209 lb (94.8 kg)    BMI 29.15 kg/m   Constitutional:  Well nourished. Alert and oriented, No acute distress. HEENT: [place] AT, mask in place.  Trachea midline Cardiovascular: No clubbing, cyanosis, or edema. Respiratory: Normal respiratory effort, no  increased work of breathing. Neurologic: Grossly intact, no focal deficits, moving all 4 extremities. Psychiatric: Normal mood and affect.   Laboratory Data: Lab Results  Component Value Date   WBC 6.9 05/27/2021   HGB 15.4 05/27/2021   HCT 44.7 05/27/2021   MCV 95.3 05/27/2021   PLT 196 05/27/2021    Lab Results  Component Value Date   CREATININE 1.06 05/27/2021    Lab Results  Component Value Date   PSA 6.10 (H) 09/21/2017   PSA 4.33 (H) 10/27/2015   PSA 4.71 (H) 06/22/2014    Lab Results  Component Value Date   HGBA1C 7.0 (H) 05/10/2021  I have reviewed the labs.   Pertinent Imaging: N/A   Assessment & Plan:    1. ED - insurance would not cover the IPP - scheduled for ICI titration with Dr. Kipp Brood  2. Prostate cancer -follow up in March 2023  Return in about 6 months (around 12/16/2021) for PSA and office visit .  These notes generated with voice recognition software. I apologize for typographical errors.  Zara Council, PA-C  [facility] [address]  [place], [place] [address] [phone]

## 2021-06-15 ENCOUNTER — Encounter: Payer: Self-pay | Admitting: Urology

## 2021-06-15 ENCOUNTER — Other Ambulatory Visit: Payer: Self-pay

## 2021-06-15 ENCOUNTER — Ambulatory Visit (INDEPENDENT_AMBULATORY_CARE_PROVIDER_SITE_OTHER): Payer: Medicare Other | Admitting: Urology

## 2021-06-15 VITALS — BP 113/62 | HR 79 | Ht 71.0 in | Wt 209.0 lb

## 2021-06-15 DIAGNOSIS — C61 Malignant neoplasm of prostate: Secondary | ICD-10-CM

## 2021-06-15 DIAGNOSIS — N529 Male erectile dysfunction, unspecified: Secondary | ICD-10-CM

## 2021-06-16 LAB — PSA: Prostate Specific Ag, Serum: <0.1 ng/mL (ref 0.0–4.0)

## 2021-06-17 ENCOUNTER — Telehealth: Payer: Self-pay | Admitting: Primary Care

## 2021-06-17 NOTE — Telephone Encounter (Signed)
Left message to return call to our office.  

## 2021-06-17 NOTE — Telephone Encounter (Signed)
Pt called asking for a call back to discuss his Blood Sugar Level. Pt states it was 184 this morning at 5:00 am. Please advise. ?

## 2021-06-17 NOTE — Telephone Encounter (Signed)
Patient wanted to let you know that his blood sugar readings have been around 180.  ?

## 2021-06-19 NOTE — Telephone Encounter (Signed)
Please thank him for the update. ? ?Continue the Glipizide XL 10 mg once daily for diabetes. ? ?How is his diarrhea? ?I see that he had a colonoscopy. What did GI say about his diarrhea? ?

## 2021-06-20 ENCOUNTER — Encounter: Payer: Self-pay | Admitting: Gastroenterology

## 2021-06-20 NOTE — Telephone Encounter (Signed)
Noted. ?Will discuss with patient at up coming visit. ?

## 2021-06-20 NOTE — Telephone Encounter (Signed)
Patient will continue medication as directed. He let GI know about diarrhea but no information or instructions given per patient. He has continued to have and has had to episodes this morning. Denies any blood in stool.  ?

## 2021-06-23 ENCOUNTER — Ambulatory Visit (INDEPENDENT_AMBULATORY_CARE_PROVIDER_SITE_OTHER): Payer: Medicare Other | Admitting: Primary Care

## 2021-06-23 ENCOUNTER — Encounter (HOSPITAL_COMMUNITY): Payer: Medicare Other

## 2021-06-23 ENCOUNTER — Encounter: Payer: Self-pay | Admitting: Primary Care

## 2021-06-23 ENCOUNTER — Other Ambulatory Visit: Payer: Self-pay

## 2021-06-23 ENCOUNTER — Ambulatory Visit (INDEPENDENT_AMBULATORY_CARE_PROVIDER_SITE_OTHER): Payer: Medicare Other

## 2021-06-23 VITALS — BP 114/62 | HR 82 | Temp 98.6°F | Ht 71.0 in | Wt 213.0 lb

## 2021-06-23 DIAGNOSIS — E1165 Type 2 diabetes mellitus with hyperglycemia: Secondary | ICD-10-CM

## 2021-06-23 DIAGNOSIS — R197 Diarrhea, unspecified: Secondary | ICD-10-CM | POA: Diagnosis not present

## 2021-06-23 DIAGNOSIS — Z7901 Long term (current) use of anticoagulants: Secondary | ICD-10-CM

## 2021-06-23 DIAGNOSIS — J309 Allergic rhinitis, unspecified: Secondary | ICD-10-CM | POA: Diagnosis not present

## 2021-06-23 DIAGNOSIS — R748 Abnormal levels of other serum enzymes: Secondary | ICD-10-CM

## 2021-06-23 HISTORY — DX: Abnormal levels of other serum enzymes: R74.8

## 2021-06-23 LAB — POCT INR: INR: 2.3 (ref 2.0–3.0)

## 2021-06-23 LAB — LIPASE: Lipase: 204 U/L — ABNORMAL HIGH (ref 11.0–59.0)

## 2021-06-23 MED ORDER — EMPAGLIFLOZIN 25 MG PO TABS: 25.0000 mg | ORAL_TABLET | Freq: Every day | ORAL | 1 refills | Status: DC

## 2021-06-23 MED ORDER — FLUTICASONE PROPIONATE 50 MCG/ACT NA SUSP: NASAL | 3 refills | Status: DC

## 2021-06-23 NOTE — Patient Instructions (Signed)
Stop by the lab prior to leaving today. I will notify you of your results once received.  ? ?Do not take metformin or Januvia for your diabetes. ? ?Continue taking glipizide XL 10 mg once daily for diabetes ? ?I increased the dose of your Jardiance to 25 mg diabetes.  I sent a new prescription to your pharmacy. ? ?Please contact GI if your diarrhea becomes bothersome/progresses.  Avoid fried/fatty/fast food, spicy food, greasy food as this can cause diarrhea. ? ?We will see you in 3 months for diabetes check. ? ?It was a pleasure to see you today! ? ?

## 2021-06-23 NOTE — Assessment & Plan Note (Signed)
Improved without resolve. ? ?Reviewed GI notes from February 2023 and colonoscopy report from February 2023. ? ?We will discontinue his metformin and Januvia permanently.  We discussed the importance of a healthy diet, avoiding irritative foods, weight loss, proper hydration with water. ? ?All prior labs were negative. ? ?We decided that he will monitor his symptoms and notify GI if they progress/become bothersome. ?

## 2021-06-23 NOTE — Progress Notes (Signed)
Subjective:    Patient ID: [name] Cook, male    DOB: 1950-12-03, 71 y.o.   MRN: [id number]  HPI  [name] Cook is a very pleasant 71 y.o. male with a medical history of atrial fibrillation, hypertension, CAD, COPD, OSA, GERD, CKD, prostate cancer, diarrhea, diverticulosis, pacemaker in place, depression, hyperlipidemia who presents today for follow-up of diarrhea and diabetes.  He is also requesting a refill of his Flonase.  1) Chronic Diarrhea: Chronic and intermittent diarrhea since Fall 2022.  He was last evaluated for diarrhea on 05/27/2021 for a 1 month history of watery stools every 2 to 3 days, 2-5 episodes per day, followed by 2 to 3 days of constipation without bowel movement.  During this visit he denied fevers, bloody stools, antibiotic use, recent travel, changes in medications, nausea/vomiting.  We decided to hold his metformin, check stool studies, refer to GI for colonoscopy and evaluation.  Stool studies returned negative.  Lipase came back elevated with level of 179 so Januvia was held.  Repeat lipase 1 week later reduced with level of 81. Glipizide XL 10 mg daily was initiated to control glucose levels.  Since his last visit he continues to experience diarrhea daily, one episode per day, occurs only the morning 30 minutes after breakfast. No diarrhea in the afternoon or evening.  He has never undergone cholecystectomy.  He has held his metformin and Januvia as instructed. Evaluated by GI on 06/02/21 for repeat colonoscopy and diarrhea. According to office notes from GI his diarrhea resolved after discontinuation of Januvia. Today he endorses this is not the case. He underwent colonoscopy on 06/13/2021, 2 polyps and diverticulosis without diverticulitis was noted, otherwise unremarkable.   He denies abdominal pain, bloody stools, fevers, nausea, vomiting. Overall feeling better.    2) Type 2 Diabetes:  Current medications include: Glipizide XL 10 mg, Jardiance 10 mg.   Januvia  held due to elevated lipase, metformin held due to diarrhea.   He is checking his blood glucose 1 times daily and is getting readings of:   AM fasting 170's  Last A1C: 7.0 in January 2023 Last Eye Exam: UTD Last Foot Exam: UTD Pneumonia Vaccination: 2019 Urine Microalbumin: None. Managed on ACE-I Statin:rosuvastatin   Dietary changes since last visit: None.    Exercise: No regular exercise.     Review of Systems  Constitutional:  Negative for fever.  Gastrointestinal:  Positive for diarrhea. Negative for abdominal pain, blood in stool, constipation, nausea and vomiting.        Past Medical History:  Diagnosis Date   Arthritis    "knees and lower back" (03/14/2013)   Atrial flutter ([place])    radiofrequency ablation in 2001   CAD (coronary artery disease)    a. Nonobstructive. Cardiac cath in 2001-50% mid RI, normal LM, LAD, RCA b. cath 10/16/2014 95% mid RCA treated with DES, 99% ost D1 medical management due to small aneurysmal segment   Chronic anticoagulation    chronic Coumadin anticoagulation   Chronic obstructive pulmonary disease ([place]) 04/20/2011   Diabetes mellitus, type 2 (HCC)    GERD (gastroesophageal reflux disease)    Hyperlipidemia    Hypertension    with hypertensive heart disease   Left knee pain 10/25/2017   medial   Obesity    Persistent atrial fibrillation ([employer])    recurrent atrial flutter since 2001 s/p DCCVs, multiple failed AADs, h/o tachy-mediated cardiomyopathy   Shortness of breath    "can come on at any time" (03/14/2013)  Sleep apnea    "dx'd; couldn't wear the mask" (03/14/2013)   Tobacco abuse     Social History   Socioeconomic History   Marital status: Widowed    Spouse name: Not on file   Number of children: 1   Years of education: Not on file   Highest education level: Not on file  Occupational History   Occupation: Unemployed    Employer: UNEMPLOYED  Tobacco Use   Smoking status: Former    Packs/day: 1.00    Years: 42.00     Pack years: 42.00    Types: Cigarettes    Quit date: 12/30/2013    Years since quitting: 7.4   Smokeless tobacco: Never  Vaping Use   Vaping Use: Never used  Substance and Sexual Activity   Alcohol use: Not Currently    Comment: 03/14/2013 "stopped drinking back in 2002; never had problem w/it"   Drug use: No   Sexual activity: Not Currently  Other Topics Concern   Not on file  Social History Narrative   Single.   Retired.    1 son, deceased.    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   Enjoys playing on the computer.       Social Determinants of Health   Financial Resource Strain: Low Risk    Difficulty of Paying Living Expenses: Not very hard  Food Insecurity: No Food Insecurity   Worried About Charity fundraiser in the Last Year: Never true   Ran Out of Food in the Last Year: Never true  Transportation Needs: No Transportation Needs   Lack of Transportation (Medical): No   Lack of Transportation (Non-Medical): No  Physical Activity: Inactive   Days of Exercise per Week: 0 days   Minutes of Exercise per Session: 0 min  Stress: No Stress Concern Present   Feeling of Stress : Not at all  Social Connections: Not on file  Intimate Partner Violence: Not At Risk   Fear of Current or Ex-Partner: No   Emotionally Abused: No   Physically Abused: No   Sexually Abused: No    Past Surgical History:  Procedure Laterality Date   ATRIAL FLUTTER ABLATION  2002   atrial flutter; subsequently developed atrial fibrillation   AV NODE ABLATION  01/24/2013   CARDIAC CATHETERIZATION  2002   CARDIAC CATHETERIZATION N/A 10/16/2014   Procedure: Left Heart Cath and Coronary Angiography;  Surgeon: [name], MD;  Location: [place] [facility];  Service: Cardiovascular;  Laterality: N/A;   CARDIOVERSION  05/31/2011   Procedure: CARDIOVERSION;  Surgeon: Cristopher Estimable. Lattie Haw, MD;  Location: [facility];  Service: Cardiovascular;  Laterality: N/A;    CARPAL TUNNEL RELEASE Left 1980's   COLONOSCOPY WITH PROPOFOL N/A 12/30/2018   Procedure: COLONOSCOPY WITH PROPOFOL;  Surgeon: Jonathon Bellows, MD;  Location: [facility] ENDOSCOPY;  Service: Gastroenterology;  Laterality: N/A;   COLONOSCOPY WITH PROPOFOL N/A 12/04/2019   Procedure: COLONOSCOPY WITH PROPOFOL;  Surgeon: Jonathon Bellows, MD;  Location: [facility] ENDOSCOPY;  Service: Gastroenterology;  Laterality: N/A;   COLONOSCOPY WITH PROPOFOL N/A 06/13/2021   Procedure: COLONOSCOPY WITH PROPOFOL;  Surgeon: Jonathon Bellows, MD;  Location: [facility] ENDOSCOPY;  Service: Gastroenterology;  Laterality: N/A;   INSERT / REPLACE / REMOVE PACEMAKER  01/24/2013    Medtronic Adapta L dual-chamber pacemaker, serial number [id number]    LEFT HEART CATH AND CORONARY ANGIOGRAPHY N/A 06/07/2018   Procedure: LEFT HEART CATH AND CORONARY ANGIOGRAPHY;  Surgeon: [name], MD;  Location:  [place] IN[facility]ASIVE CV LAB;  Service: Cardiovascular;  Laterality: N/A;   LEFT HEART CATHETERIZATION WITH CORONARY ANGIOGRAM N/A 03/17/2013   Procedure: LEFT HEART CATHETERIZATION WITH CORONARY ANGIOGRAM;  Surgeon: Burnell Blanks, MD; LAD mild dz, D1 branch 100%, inferior branch 99%, CFX OK, RCA 50%, EF 65%     LOOP RECORDER IMPLANT  2002   PERMANENT PACEMAKER INSERTION N/A 01/24/2013   Procedure: PERMANENT PACEMAKER INSERTION;  Surgeon: [name], MD;  Location: [facility] CATH LAB;  Service: Cardiovascular;  Laterality: N/A;   TIBIAL TUBERCLERPLASTY  ~ 2003    Family History  Problem Relation Age of Onset   Alzheimer's disease Mother    Osteoporosis Mother     No Known Allergies  Current Outpatient Medications on File Prior to Visit  Medication Sig Dispense Refill   albuterol (VENTOLIN HFA) 108 (90 Base) MCG/ACT inhaler INHALE 2 PUFFS BY MOUTH EVERY 4 HOURS AS NEEDED FOR WHEEZE OR FOR SHORTNESS OF BREATH 6.7 each 0   blood glucose meter kit and supplies KIT Dispense based on patient and insurance preference. Use up to four times daily as directed.  (FOR ICD-9 250.00, 250.01). 1 each 0   budesonide-formoterol (SYMBICORT) 160-4.5 MCG/ACT inhaler INHALE 2 PUFFS INTO THE LUNGS TWICE A DAY 10.2 each 5   clopidogrel (PLAVIX) 75 MG tablet TAKE 1 TABLET BY MOUTH EVERY DAY 90 tablet 3   diclofenac Sodium (VOLTAREN) 1 % GEL Apply 2 g topically 4 (four) times daily. 50 g 0   DULoxetine (CYMBALTA) 60 MG capsule TAKE 1 CAPSULE (60 MG TOTAL) BY MOUTH DAILY. FOR DEPRESSION. 90 capsule 1   empagliflozin (JARDIANCE) 10 MG TABS tablet TAKE 1 TABLET BY MOUTH EVERY MORNING FOR DIABETES 90 tablet 3   fish oil-omega-3 fatty acids 1000 MG capsule Take 1 g by mouth daily.       fluticasone (FLONASE) 50 MCG/ACT nasal spray PLACE 1 SPRAY INTO BOTH NOSTRILS DAILY AS NEEDED FOR ALLERGIES OR RHINITIS. 48 mL 3   furosemide (LASIX) 20 MG tablet TAKE 1 TABLET BY MOUTH EVERY DAY 90 tablet 3   gabapentin (NEURONTIN) 300 MG capsule Take 1 capsule (300 mg total) by mouth 2 (two) times daily. For neuropathy 180 capsule 3   glipiZIDE (GLIPIZIDE XL) 10 MG 24 hr tablet Take 1 tablet (10 mg total) by mouth daily with breakfast. For diabetes. 90 tablet 1   glucose blood test strip 1 each by Other route as needed for other. Use to check blood sugar up to three times a day-Accu-Chek meter     isosorbide mononitrate (IMDUR) 60 MG 24 hr tablet TAKE 1 TABLET BY MOUTH EVERY DAY 90 tablet 3   lisinopril (ZESTRIL) 2.5 MG tablet TAKE 1 TABLET BY MOUTH EVERY DAY 90 tablet 3   nitroGLYCERIN (NITROSTAT) 0.4 MG SL tablet Place 1 tablet (0.4 mg total) under the tongue every 5 (five) minutes as needed for chest pain. 25 tablet 0   pantoprazole (PROTONIX) 40 MG tablet TAKE 1 TABLET (40 MG TOTAL) BY MOUTH DAILY FOR HEARTBURN. 90 tablet 3   rosuvastatin (CRESTOR) 20 MG tablet TAKE 1 TABLET BY MOUTH EVERY DAY IN THE EVENING FOR CHOLESTEROL 90 tablet 1   tamsulosin (FLOMAX) 0.4 MG CAPS capsule TAKE 1 CAPSULE (0.4 MG TOTAL) BY MOUTH 2 (TWO) TIMES DAILY. 180 capsule 3   warfarin (COUMADIN) 5 MG tablet  TAKE 1/2 TABLET DAILY BY MOUTH EXCEPT TAKE 1 TABLET ON MONDAYS AND THURSDAYS OR AS DIRECTED BY ANTICOAGULATION CLINIC 90 tablet 0  No current facility-administered medications on file prior to visit.    BP 114/62    Pulse 82    Temp 98.6 F (37 C) (Oral)    Ht _0  (1.803 m)    Wt 213 lb (96.6 kg)    SpO2 98%    BMI 29.71 kg/m  Objective:   Physical Exam Cardiovascular:     Rate and Rhythm: Normal rate and regular rhythm.  Pulmonary:     Effort: Pulmonary effort is normal.     Breath sounds: Normal breath sounds. No wheezing or rales.  Abdominal:     General: Bowel sounds are normal.     Palpations: Abdomen is soft.     Tenderness: There is no abdominal tenderness.  Musculoskeletal:     Cervical back: Neck supple.  Skin:    General: Skin is warm and dry.  Neurological:     Mental Status: He is alert and oriented to person, place, and time.          Assessment & Plan:      This visit occurred during the SARS-CoV-2 public health emergency.  Safety protocols were in place, including screening questions prior to the visit, additional usage of staff PPE, and extensive cleaning of exam room while observing appropriate contact time as indicated for disinfecting solutions.

## 2021-06-23 NOTE — Assessment & Plan Note (Signed)
Improving since discontinuation of Januvia. ? ?Repeat lipase level pending today. ?

## 2021-06-23 NOTE — Patient Instructions (Addendum)
Pre visit review using our clinic review tool, if applicable. No additional management support is needed unless otherwise documented below in the visit note. ? ?Continue 2.'5mg'$  daily except take '5mg'$  on Mondays and Thursdays. Recheck in 4 wks. ?

## 2021-06-23 NOTE — Progress Notes (Signed)
Continue 2.'5mg'$  daily except take '5mg'$  on Mondays and Thursdays. Recheck in 4 wks. ?

## 2021-06-23 NOTE — Assessment & Plan Note (Signed)
Discontinue metformin due to diarrhea and Januvia due to elevated lipase levels ? ?Lipase levels improved once Januvia was discontinued.  Repeat lipase level pending today. ? ?Continue glipizide XL 10 mg daily. ?Increase Jardiance to 25 mg daily. ? ?We will plan to follow-up with him in 3 months for diabetes check. ?

## 2021-06-24 ENCOUNTER — Ambulatory Visit: Payer: Medicare Other

## 2021-06-24 ENCOUNTER — Ambulatory Visit (INDEPENDENT_AMBULATORY_CARE_PROVIDER_SITE_OTHER): Payer: Medicare Other

## 2021-06-24 DIAGNOSIS — I4819 Other persistent atrial fibrillation: Secondary | ICD-10-CM | POA: Diagnosis not present

## 2021-06-25 LAB — CUP PACEART REMOTE DEVICE CHECK
Battery Impedance: 977 Ohm
Battery Remaining Longevity: 65 mo
Battery Voltage: 2.78 V
Brady Statistic RV Percent Paced: 100 %
Date Time Interrogation Session: 20230310172501
Implantable Lead Implant Date: 20141010
Implantable Lead Implant Date: 20141010
Implantable Lead Location: 753859
Implantable Lead Location: 753860
Implantable Lead Model: 5076
Implantable Lead Model: 5076
Implantable Pulse Generator Implant Date: 20141010
Lead Channel Impedance Value: 564 Ohm
Lead Channel Impedance Value: 67 Ohm
Lead Channel Pacing Threshold Amplitude: 0.75 V
Lead Channel Pacing Threshold Pulse Width: 0.4 ms
Lead Channel Setting Pacing Amplitude: 2.5 V
Lead Channel Setting Pacing Pulse Width: 0.4 ms
Lead Channel Setting Sensing Sensitivity: 4 mV

## 2021-06-27 ENCOUNTER — Other Ambulatory Visit: Payer: Self-pay | Admitting: Primary Care

## 2021-06-27 DIAGNOSIS — Z7901 Long term (current) use of anticoagulants: Secondary | ICD-10-CM

## 2021-06-27 DIAGNOSIS — F339 Major depressive disorder, recurrent, unspecified: Secondary | ICD-10-CM

## 2021-06-27 DIAGNOSIS — E1142 Type 2 diabetes mellitus with diabetic polyneuropathy: Secondary | ICD-10-CM

## 2021-06-27 DIAGNOSIS — J439 Emphysema, unspecified: Secondary | ICD-10-CM

## 2021-06-27 DIAGNOSIS — R197 Diarrhea, unspecified: Secondary | ICD-10-CM

## 2021-06-28 NOTE — Progress Notes (Signed)
Remote pacemaker transmission.   

## 2021-06-30 ENCOUNTER — Telehealth: Payer: Self-pay

## 2021-06-30 DIAGNOSIS — R197 Diarrhea, unspecified: Secondary | ICD-10-CM

## 2021-06-30 NOTE — Telephone Encounter (Signed)
Thank you! ?I'd like to know when he's going, hopefully tomorrow given the urgency of this imaging.  ?

## 2021-06-30 NOTE — Telephone Encounter (Signed)
Left message to discuss scan with patient. He also needs labs before CT with contrast can be done.  ? ?I scheduled patient for CT scan for 07/01/21 at 1 pm at [place]. Need to arrive at 12:30 pm. NPO for 4 hours prior. Needs to pick up contrast to drink tonight at [facility] or early in the morning. ? ? ?5:17 PM Spoke with patient. Patient will go and get contras at [facility] this evening. He will come early in the morning tomorrow 07/01/21 to get labs done for the scan. ? ?Anda Kraft please order. Thank you ?

## 2021-06-30 NOTE — Telephone Encounter (Signed)
Patient called to follow up on CT Scan that was ordered on 06/27/21 STAT and has not been worked on yet. I sent a message to STAT chat about this to see if someone can help with this. ?FYI to [place] and referral ?

## 2021-07-01 ENCOUNTER — Ambulatory Visit
Admission: RE | Admit: 2021-07-01 | Discharge: 2021-07-01 | Disposition: A | Discharge status: Home / Self Care | Payer: Medicare Other | Source: Ambulatory Visit | Attending: Primary Care | Admitting: Primary Care

## 2021-07-01 ENCOUNTER — Other Ambulatory Visit: Payer: Self-pay

## 2021-07-01 ENCOUNTER — Other Ambulatory Visit (INDEPENDENT_AMBULATORY_CARE_PROVIDER_SITE_OTHER): Payer: Medicare Other

## 2021-07-01 ENCOUNTER — Telehealth: Payer: Self-pay | Admitting: *Deleted

## 2021-07-01 DIAGNOSIS — R197 Diarrhea, unspecified: Secondary | ICD-10-CM | POA: Diagnosis not present

## 2021-07-01 DIAGNOSIS — I700 Atherosclerosis of aorta: Secondary | ICD-10-CM | POA: Insufficient documentation

## 2021-07-01 DIAGNOSIS — K5730 Diverticulosis of large intestine without perforation or abscess without bleeding: Secondary | ICD-10-CM | POA: Diagnosis not present

## 2021-07-01 DIAGNOSIS — K8020 Calculus of gallbladder without cholecystitis without obstruction: Secondary | ICD-10-CM | POA: Insufficient documentation

## 2021-07-01 DIAGNOSIS — I7143 Infrarenal abdominal aortic aneurysm, without rupture: Secondary | ICD-10-CM | POA: Diagnosis not present

## 2021-07-01 LAB — BASIC METABOLIC PANEL
BUN: 22 mg/dL (ref 6–23)
CO2: 27 mEq/L (ref 19–32)
Calcium: 9.3 mg/dL (ref 8.4–10.5)
Chloride: 102 mEq/L (ref 96–112)
Creatinine, Ser: 1.1 mg/dL (ref 0.40–1.50)
GFR: 68.68 mL/min (ref >60.00)
Glucose, Bld: 176 mg/dL — ABNORMAL HIGH (ref 70–99)
Potassium: 4 mEq/L (ref 3.5–5.1)
Sodium: 137 mEq/L (ref 135–145)

## 2021-07-01 MED ORDER — IOHEXOL 300 MG/ML  SOLN
100.0000 mL | Freq: Once | INTRAMUSCULAR | Status: AC | PRN
  Administered 2021-07-01: 100 mL via INTRAVENOUS

## 2021-07-01 NOTE — Telephone Encounter (Signed)
Colletta Maryland from [place] called wanting to let Allie Bossier NP know that results are in Epic for review.  ?Colletta Maryland stated that patient has left and will wait on call from the office. ?Sending message to Romilda Garret NP since Anda Kraft has left for the day. ?

## 2021-07-01 NOTE — Telephone Encounter (Signed)
Labs ordered.  Thank you for your help! ?

## 2021-07-01 NOTE — Telephone Encounter (Signed)
I got notification of a CT report being called. I reviewed the report and discussed it with the patient. No urgent or emergent findings. Informed him that when Anda Kraft was back in office she or her staff will call with further instructions as to next steps or GI referral  ?

## 2021-07-03 ENCOUNTER — Other Ambulatory Visit: Payer: Self-pay | Admitting: Physician Assistant

## 2021-07-03 NOTE — Telephone Encounter (Signed)
Already addressed in result note.

## 2021-07-11 ENCOUNTER — Other Ambulatory Visit: Payer: Self-pay | Admitting: Primary Care

## 2021-07-11 ENCOUNTER — Other Ambulatory Visit: Payer: Self-pay | Admitting: Internal Medicine

## 2021-07-11 DIAGNOSIS — E119 Type 2 diabetes mellitus without complications: Secondary | ICD-10-CM

## 2021-07-12 ENCOUNTER — Ambulatory Visit: Admit: 2021-07-12 | Payer: Medicare Other | Admitting: Urology

## 2021-07-12 SURGERY — INSERTION, PENILE PROSTHESIS, INFLATABLE
Anesthesia: General

## 2021-07-19 ENCOUNTER — Telehealth: Payer: Self-pay

## 2021-07-19 NOTE — Telephone Encounter (Signed)
Pt called to [facility] coumadin clinic apt for 4/6 at [facility]. Pt cannot make apt on Thursdays at [facility] so he will go to [place] for coumadin clinic on 4/12. Pt given address and number to coumadin clinic if any difficulties. Pt verbalized understanding and was appreciative.  ?

## 2021-07-21 ENCOUNTER — Ambulatory Visit: Payer: Medicare Other

## 2021-07-25 ENCOUNTER — Telehealth: Payer: Self-pay

## 2021-07-25 NOTE — Chronic Care Management (AMB) (Signed)
? ? ?Chronic Care Management ?Pharmacy Assistant  ? ?Name: [name] Cook  MRN: [id number] DOB: April 27, 1950 ? ? ?Reason for Encounter:Hypertension, Diabetes  Disease State ? ?Recent office visits:  ?07/28/21-PCP-Katherine Clark,NP-Follow up diabetes and depression-Increase gabapentin to 637m at bedtime-improve on diet -Increase Cymbalta to 973mdaily ?06/23/21-PCP-Katherine Clark,NP-rhinitis-refill flonase.Hold metformin due to diarrhea.Januvia held due to elevated lipase-increase dose of Jardiance to 2564m ?Recent consult visits:  ?07/27/21-Coumadin Clinic ?06/15/21-Urology-Shannon McGowan,PA-Follow up ED.No medication changes  ?06/13/21-[facility]- Kiran Anna,MD- colonoscopy procedure ? ?Hospital visits:  ?None in previous 6 months ? ?Medications: ?Outpatient Encounter Medications as of 07/25/2021  ?Medication Sig Note  ? albuterol (VENTOLIN HFA) 108 (90 Base) MCG/ACT inhaler INHALE 2 PUFFS BY MOUTH EVERY 4 HOURS AS NEEDED FOR WHEEZE OR FOR SHORTNESS OF BREATH   ? blood glucose meter kit and supplies KIT Dispense based on patient and insurance preference. Use up to four times daily as directed. (FOR ICD-9 250.00, 250.01).   ? budesonide-formoterol (SYMBICORT) 160-4.5 MCG/ACT inhaler INHALE 2 PUFFS INTO THE LUNGS TWICE A DAY   ? clopidogrel (PLAVIX) 75 MG tablet TAKE 1 TABLET BY MOUTH EVERY DAY 06/10/2021: On hold for colonoscopy 06/13/21  ? diclofenac Sodium (VOLTAREN) 1 % GEL Apply 2 g topically 4 (four) times daily.   ? DULoxetine (CYMBALTA) 60 MG capsule TAKE 1 CAPSULE (60 MG TOTAL) BY MOUTH DAILY. FOR DEPRESSION.   ? empagliflozin (JARDIANCE) 25 MG TABS tablet Take 1 tablet (25 mg total) by mouth daily before breakfast. For diabetes.   ? fish oil-omega-3 fatty acids 1000 MG capsule Take 1 g by mouth daily.     ? fluticasone (FLONASE) 50 MCG/ACT nasal spray PLACE 1 SPRAY INTO BOTH NOSTRILS DAILY AS NEEDED FOR ALLERGIES OR RHINITIS.   ? furosemide (LASIX) 20 MG tablet TAKE 1 TABLET BY MOUTH EVERY DAY   ? gabapentin (NEURONTIN) 300  MG capsule Take 1 capsule (300 mg total) by mouth 2 (two) times daily. For neuropathy   ? glipiZIDE (GLIPIZIDE XL) 10 MG 24 hr tablet Take 1 tablet (10 mg total) by mouth daily with breakfast. For diabetes.   ? glucose blood test strip 1 each by Other route as needed for other. Use to check blood sugar up to three times a day-Accu-Chek meter   ? isosorbide mononitrate (IMDUR) 60 MG 24 hr tablet TAKE 1 TABLET BY MOUTH EVERY DAY   ? lisinopril (ZESTRIL) 2.5 MG tablet TAKE 1 TABLET BY MOUTH EVERY DAY   ? nitroGLYCERIN (NITROSTAT) 0.4 MG SL tablet Place 1 tablet (0.4 mg total) under the tongue every 5 (five) minutes as needed for chest pain.   ? pantoprazole (PROTONIX) 40 MG tablet TAKE 1 TABLET (40 MG TOTAL) BY MOUTH DAILY FOR HEARTBURN.   ? rosuvastatin (CRESTOR) 20 MG tablet TAKE 1 TABLET BY MOUTH EVERY DAY IN THE EVENING FOR CHOLESTEROL   ? tamsulosin (FLOMAX) 0.4 MG CAPS capsule TAKE 1 CAPSULE (0.4 MG TOTAL) BY MOUTH 2 (TWO) TIMES DAILY.   ? warfarin (COUMADIN) 5 MG tablet TAKE 1/2 TABLET DAILY BY MOUTH EXCEPT TAKE 1 TABLET ON MONDAYS AND THURSDAYS OR AS DIRECTED BY ANTICOAGULATION CLINIC   ? ?No facility-administered encounter medications on file as of 07/25/2021.  ? ? ? ?Recent Relevant Labs: ?Lab Results  ?Component Value Date/Time  ? HGBA1C 7.0 (H) 05/10/2021 09:50 AM  ? HGBA1C 6.5 (A) 01/31/2021 09:45 AM  ? HGBA1C 7.6 (H) 10/07/2020 10:18 AM  ? HGBA1C 6.4 (H) 11/27/2016 11:33 AM  ? HGBA1C 7.1 (H) 08/21/2016 10:25  AM  ? MICROALBUR <0.7 09/21/2017 01:06 PM  ? MICROALBUR <0.2 11/27/2016 11:33 AM  ?  ?Kidney Function ?Lab Results  ?Component Value Date/Time  ? CREATININE 1.10 07/01/2021 08:02 AM  ? CREATININE 1.06 05/27/2021 03:15 PM  ? CREATININE 1.18 03/23/2021 02:59 PM  ? CREATININE 0.99 03/28/2017 02:33 PM  ? GFR 68.68 07/01/2021 08:02 AM  ? GFRNONAA >60 01/23/2020 09:36 PM  ? GFRNONAA 80 03/28/2017 02:33 PM  ? GFRAA 72 06/23/2019 11:06 AM  ? GFRAA 93 03/28/2017 02:33 PM  ? ? ? ?Attempted contact with [name] Cook 3 times on 07/26/21,07/28/21,08/05/21. Unsuccessful outreach. Will attempt contact next month.  ? ?Current antihyperglycemic regimen:  ?Jardiance 10 mg daily  ?Glipizide XL 10 mg daily  ? ? ? ?Adherence Review: ?Is the patient currently on a STATIN medication? Yes ?Is the patient currently on ACE/ARB medication? Yes ?Does the patient have >5 day gap between last estimated fill dates? No ? ? ? ?Star Rating Drugs:  ?Medication:  Last Fill: Day Supply ?Lisinopril 2.8m  07/11/21 90  ?Jardiance 212m3/9/23  90 ?Glipizide XL 1065m/22/23 90 ?Rosuvastatin 48m64m16/23 90 ?Januvia 100mg45m1/23 90 ?Metformin ER 500mg 3m7/23 90 ? ? ? ? ?Recent Office Vitals: ?BP Readings from Last 3 Encounters:  ?06/23/21 114/62  ?06/15/21 113/62  ?06/13/21 121/79  ? ?Pulse Readings from Last 3 Encounters:  ?06/23/21 82  ?06/15/21 79  ?06/13/21 73  ?  ?Wt Readings from Last 3 Encounters:  ?06/23/21 213 lb (96.6 kg)  ?06/15/21 209 lb (94.8 kg)  ?06/13/21 210 lb (95.3 kg)  ?  ? ?Kidney Function ?Lab Results  ?Component Value Date/Time  ? CREATININE 1.10 07/01/2021 08:02 AM  ? CREATININE 1.06 05/27/2021 03:15 PM  ? CREATININE 1.18 03/23/2021 02:59 PM  ? CREATININE 0.99 03/28/2017 02:33 PM  ? GFR 68.68 07/01/2021 08:02 AM  ? GFRNONAA >60 01/23/2020 09:36 PM  ? GFRNONAA 80 03/28/2017 02:33 PM  ? GFRAA 72 06/23/2019 11:06 AM  ? GFRAA 93 03/28/2017 02:33 PM  ? ? ? ?  Latest Ref Rng & Units 07/01/2021  ?  8:02 AM 05/27/2021  ?  3:15 PM 03/23/2021  ?  2:59 PM  ?BMP  ?Glucose 70 - 99 mg/dL 176   104   308    ?BUN 6 - 23 mg/dL _0 ?Creatinine 0.40 - 1.50 mg/dL 1.10   1.06   1.18    ?BUN/Creat Ratio 6 - 22 (calc)  NOT APPLICABLE     ?Sodium 135 - 145 mEq/L 137   139   135    ?Potassium 3.5 - 5.1 mEq/L 4.0   4.3   3.9    ?Chloride 96 - 112 mEq/L 102   106   100    ?CO2 19 - 32 mEq/L _1 ?Calcium 8.4 - 10.5 mg/dL 9.3   9.2   10.0    ? ? ? ?Current antihypertensive regimen:  ?Lisinopril 2.5 mg daily ?Furosemide 20 mg daily   ?Isosorbide Mononitrate 60 mg daily ? ? ? ? ?Star Rating Drugs:  ?Medication:  Last Fill: Day Supply ?Lisinopril 2.5mg  35m/23 90  ?Jardiance 25mg 3/45m  90 ?Glipizide XL 10mg 2/281m 90 ?Rosuvastatin 48mg 2/1668m90 ?Januvia 100mg 2/11/75m0 ?Metformin ER 500mg  3/17/80m0 ? ? ?Upcoming appointments: ?CCM appointment on 09/13/21   Oncology 08/11/21 ? ? ?[name] FoltCharlene Brookeed ? ?[name] Cook, CCMA ?  Health concierge  ?[phone]  ?

## 2021-07-27 ENCOUNTER — Ambulatory Visit (INDEPENDENT_AMBULATORY_CARE_PROVIDER_SITE_OTHER): Payer: Medicare Other

## 2021-07-27 ENCOUNTER — Telehealth: Payer: Self-pay

## 2021-07-27 DIAGNOSIS — Z7901 Long term (current) use of anticoagulants: Secondary | ICD-10-CM | POA: Diagnosis not present

## 2021-07-27 LAB — POCT INR: INR: 2.3 (ref 2.0–3.0)

## 2021-07-27 NOTE — Telephone Encounter (Signed)
Left message to return call to our office.  

## 2021-07-27 NOTE — Progress Notes (Addendum)
Continue 2.'5mg'$  daily except take '5mg'$  on Mondays and Thursdays. Recheck in 7 weeks per pt request due to pt being out of town in 6 weeks.  ? ?Pt reported elevated blood glucose readings since he was taken off of metformin and Januvia. He reports AM glucose readings in the upper 100's and as high as the mid 200's. No symptoms from the elevated blood glucose. Pt also reported he has had lightheadedness when he gets up from a sitting position or leans over to pick something up off the floor, occurring for the last 2 months. No other symptoms.  ?Advised pt a msg would be sent to PCP and her CMA to see if they can get him on the schedule. This nurse tried to schedule him but was unable to find an opening in the PCP's schedule. Advised if he developed any new symptoms or any symptoms worsened to contact the office. [place] office will f/u to get him scheduled. Pt verbalized understanding. ? ?Sent msg to PCP and CMA to contact pt to schedule for OV.  ?

## 2021-07-27 NOTE — Telephone Encounter (Signed)
Thanks for letting me know we have got patient on schedule for tomorrow at 3:30. Patient is aware of appointment time/date.  ?

## 2021-07-27 NOTE — Patient Instructions (Addendum)
Pre visit review using our clinic review tool, if applicable. No additional management support is needed unless otherwise documented below in the visit note. ? ?Continue 2.'5mg'$  daily except take '5mg'$  on Mondays and Thursdays. Recheck in 7 wks. ?

## 2021-07-27 NOTE — Telephone Encounter (Signed)
F/u  Returning call back to the CMA.  

## 2021-07-27 NOTE — Telephone Encounter (Signed)
Pt in for INR check today at [place].  ?Pt reported elevated blood glucose readings since he was taken off of metformin and Januvia. He reports AM glucose readings in the upper 100's and as high as the mid 200's. No symptoms from the elevated blood glucose. Pt also reported he has had lightheadedness when he gets up from a sitting position or leans over to pick something up off the floor, occurring for the last 2 months. No other symptoms.  ?Advised pt a msg would be sent to PCP and her CMA to see if they can get him on the schedule. This nurse tried to schedule him but was unable to find an opening in the PCP's schedule. Advised if he developed any new symptoms or any symptoms worsened to contact the office. [place] office will f/u to get him scheduled. Pt verbalized understanding. ?

## 2021-07-27 NOTE — Progress Notes (Signed)
I have reviewed the patient's encounter and agree with the documentation. ? ?Algis Greenhouse. Jerline Pain, MD ?07/27/2021 12:05 PM  ? ?

## 2021-07-28 ENCOUNTER — Other Ambulatory Visit: Payer: Self-pay | Admitting: Primary Care

## 2021-07-28 ENCOUNTER — Ambulatory Visit (INDEPENDENT_AMBULATORY_CARE_PROVIDER_SITE_OTHER): Payer: Medicare Other | Admitting: Primary Care

## 2021-07-28 ENCOUNTER — Encounter: Payer: Self-pay | Admitting: Primary Care

## 2021-07-28 VITALS — BP 118/62 | HR 88 | Temp 98.6°F | Ht 71.0 in | Wt 212.0 lb

## 2021-07-28 DIAGNOSIS — E1142 Type 2 diabetes mellitus with diabetic polyneuropathy: Secondary | ICD-10-CM | POA: Diagnosis not present

## 2021-07-28 DIAGNOSIS — F3341 Major depressive disorder, recurrent, in partial remission: Secondary | ICD-10-CM

## 2021-07-28 DIAGNOSIS — E1340 Other specified diabetes mellitus with diabetic neuropathy, unspecified: Secondary | ICD-10-CM | POA: Diagnosis not present

## 2021-07-28 DIAGNOSIS — J439 Emphysema, unspecified: Secondary | ICD-10-CM

## 2021-07-28 DIAGNOSIS — E1165 Type 2 diabetes mellitus with hyperglycemia: Secondary | ICD-10-CM

## 2021-07-28 MED ORDER — GABAPENTIN 300 MG PO CAPS: ORAL_CAPSULE | ORAL | 0 refills | Status: DC

## 2021-07-28 MED ORDER — DULOXETINE HCL 30 MG PO CPEP: 30.0000 mg | ORAL_CAPSULE | Freq: Every day | ORAL | 0 refills | Status: DC

## 2021-07-28 NOTE — Assessment & Plan Note (Signed)
Will increase gabapentin. ? ?Continue 300 mg every morning, increase to 600 mg at bedtime. ? ?He agrees and will update if no improvement. ?

## 2021-07-28 NOTE — Progress Notes (Signed)
? ?Subjective:  ? ? Patient ID: [name] Cook, male    DOB: 03-27-1951, 71 y.o.   MRN: [id number] ? ?HPI ? ?[name] Cook is a very pleasant 71 y.o. male with a medical history of atrial fibrillation, hypertension, COPD, OSA, type 2 diabetes, CKD, prostate cancer who presents today to discuss hyperglycemia and depression.  He would also like to increase his gabapentin dose for neuropathy. ? ?1) Type 2 Diabetes: Chronic history of type 2 diabetes and is managed on Jardiance 25 mg daily, glipizide XL 10 mg daily.  Previously managed on Januvia and metformin, but these medications were discontinued as he developed diarrhea and uncomplicated pancreatitis. ? ?Today he endorses compliance to Jardiance 25 mg daily and glipizide XL 10 mg daily. ? ?He is checking his glucose readings once daily for the last few days fasting in the morning. He is seeing readings of 131, 161, 184. He checked his glucose reading 1 hour after eating dinner and saw 244. He became concerned given these elevated readings.  ? ?He endorses a poor diet overall. He lives with his girlfriend and mother who do not cook. He is eating sandwiches, take out food, pizza mostly. He is drinking sugar free Kool-Aid and water mostly.  Sometimes he will drink Pepsi Zero.  ? ?He is managed on gabapentin 300 mg BID for neuropathy. Overall this has been effective but he continues to notice sharp pain to his plantar feet in between toes.  Gabapentin does not cause drowsiness for him.  He would like a dose adjustment of his gabapentin. ? ?BP Readings from Last 3 Encounters:  ?07/28/21 118/62  ?06/23/21 114/62  ?06/15/21 113/62  ? ?2) MDD: Chronic for years.  Currently managed on duloxetine 60 mg daily.  During our last visit in December 2022 he endorsed significant improvement since his duloxetine was increased to 60 mg.  Today he continues to notice depression symptoms which include little motivation to do things, feeling down/depressed.  He has noticed an improvement  overall on duloxetine, questions if he needs a dose increase again. ? ?He denies SI/HI. ? ? ?Review of Systems  ?Respiratory:  Negative for shortness of breath.   ?Cardiovascular:  Negative for chest pain.  ?Endocrine: Negative for polyuria.  ?Neurological:  Positive for numbness.  ? ?   ? ? ?Past Medical History:  ?Diagnosis Date  ? Arthritis   ? "knees and lower back" (03/14/2013)  ? Atrial flutter ([place])   ? radiofrequency ablation in 2001  ? CAD (coronary artery disease)   ? a. Nonobstructive. Cardiac cath in 2001-50% mid RI, normal LM, LAD, RCA b. cath 10/16/2014 95% mid RCA treated with DES, 99% ost D1 medical management due to small aneurysmal segment  ? Chronic anticoagulation   ? chronic Coumadin anticoagulation  ? Chronic obstructive pulmonary disease ([place]) 04/20/2011  ? Diabetes mellitus, type 2 ([place])   ? GERD (gastroesophageal reflux disease)   ? Hyperlipidemia   ? Hypertension   ? with hypertensive heart disease  ? Left knee pain 10/25/2017  ? medial  ? Obesity   ? Persistent atrial fibrillation ([place])   ? recurrent atrial flutter since 2001 s/p DCCVs, multiple failed AADs, h/o tachy-mediated cardiomyopathy  ? Shortness of breath   ? "can come on at any time" (03/14/2013)  ? Sleep apnea   ? "dx'd; couldn't wear the mask" (03/14/2013)  ? Tobacco abuse   ? ? ?Social History  ? ?Socioeconomic History  ? Marital status: Widowed  ?  Spouse  name: Not on file  ? Number of children: 1  ? Years of education: Not on file  ? Highest education level: Not on file  ?Occupational History  ? Occupation: Unemployed  ?  Employer: UNEMPLOYED  ?Tobacco Use  ? Smoking status: Former  ?  Packs/day: 1.00  ?  Years: 42.00  ?  Pack years: 42.00  ?  Types: Cigarettes  ?  Quit date: 12/30/2013  ?  Years since quitting: 7.5  ? Smokeless tobacco: Never  ?Vaping Use  ? Vaping Use: Never used  ?Substance and Sexual Activity  ? Alcohol use: Not Currently  ?  Comment: 03/14/2013 "stopped drinking back in 2002; never had problem w/it"  ? Drug  use: No  ? Sexual activity: Not Currently  ?Other Topics Concern  ? Not on file  ?Social History Narrative  ? Single.  ? Retired.   ? 1 son, deceased.   ? 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  ? Enjoys playing on the computer.   ?   ? ?Social Determinants of Health  ? ?Financial Resource Strain: Low Risk   ? Difficulty of Paying Living Expenses: Not very hard  ?Food Insecurity: No Food Insecurity  ? Worried About Charity fundraiser in the Last Year: Never true  ? Ran Out of Food in the Last Year: Never true  ?Transportation Needs: No Transportation Needs  ? Lack of Transportation (Medical): No  ? Lack of Transportation (Non-Medical): No  ?Physical Activity: Inactive  ? Days of Exercise per Week: 0 days  ? Minutes of Exercise per Session: 0 min  ?Stress: No Stress Concern Present  ? Feeling of Stress : Not at all  ?Social Connections: Not on file  ?Intimate Partner Violence: Not At Risk  ? Fear of Current or Ex-Partner: No  ? Emotionally Abused: No  ? Physically Abused: No  ? Sexually Abused: No  ? ? ?Past Surgical History:  ?Procedure Laterality Date  ? ATRIAL FLUTTER ABLATION  2002  ? atrial flutter; subsequently developed atrial fibrillation  ? AV NODE ABLATION  01/24/2013  ? CARDIAC CATHETERIZATION  2002  ? CARDIAC CATHETERIZATION N/A 10/16/2014  ? Procedure: Left Heart Cath and Coronary Angiography;  Surgeon: [name], MD;  Location: [place] [facility];  Service: Cardiovascular;  Laterality: N/A;  ? CARDIOVERSION  05/31/2011  ? Procedure: CARDIOVERSION;  Surgeon: Cristopher Estimable. Lattie Haw, MD;  Location: [facility];  Service: Cardiovascular;  Laterality: N/A;  ? CARPAL TUNNEL RELEASE Left 1980's  ? COLONOSCOPY WITH PROPOFOL N/A 12/30/2018  ? Procedure: COLONOSCOPY WITH PROPOFOL;  Surgeon: Jonathon Bellows, MD;  Location: [facility] ENDOSCOPY;  Service: Gastroenterology;  Laterality: N/A;  ? COLONOSCOPY WITH PROPOFOL N/A 12/04/2019  ? Procedure: COLONOSCOPY WITH PROPOFOL;  Surgeon: Jonathon Bellows, MD;  Location: [facility] ENDOSCOPY;  Service: Gastroenterology;  Laterality: N/A;  ? COLONOSCOPY WITH PROPOFOL N/A 06/13/2021  ? Procedure: COLONOSCOPY WITH PROPOFOL;  Surgeon: Jonathon Bellows, MD;  Location: [facility] ENDOSCOPY;  Service: Gastroenterology;  Laterality: N/A;  ? INSERT / REPLACE / REMOVE PACEMAKER  01/24/2013  ?  Medtronic Adapta L dual-chamber pacemaker, serial number [id number]   ? LEFT HEART CATH AND CORONARY ANGIOGRAPHY N/A 06/07/2018  ? Procedure: LEFT HEART CATH AND CORONARY ANGIOGRAPHY;  Surgeon: [name], MD;  Location: [place] [facility];  Service: Cardiovascular;  Laterality: N/A;  ? LEFT HEART CATHETERIZATION WITH CORONARY ANGIOGRAM N/A 03/17/2013  ? Procedure: LEFT HEART CATHETERIZATION WITH CORONARY ANGIOGRAM;  Surgeon: Burnell Blanks,  MD; LAD mild dz, D1 branch 100%, inferior branch 99%, CFX OK, RCA 50%, EF 65%    ? LOOP RECORDER IMPLANT  2002  ? PERMANENT PACEMAKER INSERTION N/A 01/24/2013  ? Procedure: PERMANENT PACEMAKER INSERTION;  Surgeon: [name], MD;  Location[facility] CATH LAB;  Service: Cardiovascular;  Laterality: N/A;  ? TIBIAL TUBERCLERPLASTY  ~ 2003  ? ? ?Family History  ?Problem Relation Age of Onset  ? Alzheimer's disease Mother   ? Osteoporosis Mother   ? ? ?Allergies  ?Allergen Reactions  ? Januvia [Sitagliptin] Other (See Comments)  ?  Elevated lipase  ? Metformin And Related Diarrhea  ? ? ?Current Outpatient Medications on File Prior to Visit  ?Medication Sig Dispense Refill  ? albuterol (VENTOLIN HFA) 108 (90 Base) MCG/ACT inhaler INHALE 2 PUFFS BY MOUTH EVERY 4 HOURS AS NEEDED FOR WHEEZE OR FOR SHORTNESS OF BREATH 6.7 each 0  ? blood glucose meter kit and supplies KIT Dispense based on patient and insurance preference. Use up to four times daily as directed. (FOR ICD-9 250.00, 250.01). 1 each 0  ? budesonide-formoterol (SYMBICORT) 160-4.5 MCG/ACT inhaler INHALE 2 PUFFS INTO THE LUNGS TWICE A DAY 10.2 each 5  ? clopidogrel (PLAVIX) 75 MG tablet TAKE 1 TABLET  BY MOUTH EVERY DAY 90 tablet 3  ? diclofenac Sodium (VOLTAREN) 1 % GEL Apply 2 g topically 4 (four) times daily. 50 g 0  ? DULoxetine (CYMBALTA) 60 MG capsule TAKE 1 CAPSULE (60 MG TOTAL) BY MOUTH DAILY. FO

## 2021-07-28 NOTE — Patient Instructions (Signed)
We increased the dose of your gabapentin. Take 1 capsule every morning and 2 capsules at bedtime for nerve pain. ? ?We increased the dose of your duloxetine (Cymbalta) depression medication.  Continue the 60 mg capsule, start taking the 30 mg capsule for a total dose of 90 mg daily. ? ?It is important that you improve your diet. Please limit carbohydrates in the form of white bread, rice, pasta, sweets, fast food, fried food, sugary drinks, etc. Increase your consumption of fresh fruits and vegetables, whole grains, lean protein. ? ?Ensure you are consuming 64 ounces of water daily. ? ?It was a pleasure to see you today! ? ? ?

## 2021-07-28 NOTE — Assessment & Plan Note (Addendum)
Controlled with A1c of 7.2 today. ? ?Strongly advised to work on his diet.  We discussed that additional medications are limited given his history, otherwise insulin will be warranted in the future. ? ?Continue Jardiance 25 mg daily, continue glipizide XL 10 mg daily. ? ?Follow-up in 3 months. ?

## 2021-07-29 ENCOUNTER — Other Ambulatory Visit: Payer: Self-pay | Admitting: *Deleted

## 2021-08-02 ENCOUNTER — Telehealth: Payer: Self-pay | Admitting: *Deleted

## 2021-08-02 NOTE — Telephone Encounter (Signed)
Patient called asking for return call to reschedule his appointment for 08/04/21 ? ?

## 2021-08-02 NOTE — Telephone Encounter (Signed)
The only thing I see scheduled is PSA lab on 4/20 and Dr. Baruch Gouty on 4/27.  Forwarding message to radiation oncology nursing team. ?

## 2021-08-04 ENCOUNTER — Inpatient Hospital Stay: Payer: Medicare Other

## 2021-08-05 ENCOUNTER — Inpatient Hospital Stay: Payer: Medicare Other | Attending: Radiation Oncology

## 2021-08-05 DIAGNOSIS — C61 Malignant neoplasm of prostate: Secondary | ICD-10-CM | POA: Diagnosis present

## 2021-08-05 LAB — PSA: Prostatic Specific Antigen: 0.02 ng/mL (ref 0.00–4.00)

## 2021-08-11 ENCOUNTER — Ambulatory Visit: Payer: Medicare Other | Admitting: Radiation Oncology

## 2021-08-12 ENCOUNTER — Ambulatory Visit
Admission: RE | Admit: 2021-08-12 | Discharge: 2021-08-12 | Disposition: A | Discharge status: Home / Self Care | Payer: Medicare Other | Source: Ambulatory Visit | Attending: Radiation Oncology | Admitting: Radiation Oncology

## 2021-08-12 VITALS — BP 107/79 | HR 79 | Temp 95.1°F | Resp 18 | Ht 71.0 in | Wt 211.6 lb

## 2021-08-12 DIAGNOSIS — Z08 Encounter for follow-up examination after completed treatment for malignant neoplasm: Secondary | ICD-10-CM | POA: Diagnosis not present

## 2021-08-12 DIAGNOSIS — C61 Malignant neoplasm of prostate: Secondary | ICD-10-CM

## 2021-08-12 NOTE — Progress Notes (Signed)
Radiation Oncology ?Follow up Note ? ?Name: [name] Cook   ?Date:   08/12/2021 ?MRN:  [id number] ?DOB: [birth year]  ? ? ?This 71 y.o. 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 ? ?REFERRING PROVIDER: [name], NP ? ?HPI: Patient is a 71 year old male now out 3 years having completed IMRT radiation therapy to his prostate for Gleason 7 adenocarcinoma.  Seen today in routine follow-up he is doing well specifically denies any increased lower urinary tract symptoms diarrhea or fatigue.  His most recent PSA is completely stable at 0.02 from a year ago.  Patient last month had a CT scan of his abdomen and pelvis for some diarrhea with no acute abdominal pelvic findings.  Does have a stable infrarenal aortic aneurysm.  He also had a screening CT scan showing benign appearance of the chest. ? ?COMPLICATIONS OF TREATMENT: none ? ?FOLLOW UP COMPLIANCE: keeps appointments  ? ?PHYSICAL EXAM:  ?BP 107/79   Pulse 79   Temp (!) 95.1 ?F (35.1 ?C)   Resp 18   Ht '5\' 11"'$  (1.803 m)   Wt 211 lb 9.6 oz (96 kg)   BMI 29.51 kg/m?  ?Well-developed well-nourished patient in NAD. HEENT reveals PERLA, EOMI, discs not visualized.  Oral cavity is clear. No oral mucosal lesions are identified. Neck is clear without evidence of cervical or supraclavicular adenopathy. Lungs are clear to A&P. Cardiac examination is essentially unremarkable with regular rate and rhythm without murmur rub or thrill. Abdomen is benign with no organomegaly or masses noted. Motor sensory and DTR levels are equal and symmetric in the upper and lower extremities. Cranial nerves II through XII are grossly intact. Proprioception is intact. No peripheral adenopathy or edema is identified. No motor or sensory levels are noted. Crude visual fields are within normal range. ? ?RADIOLOGY RESULTS: CT scan of chest abdomen pelvis  all reviewed compatible with above-stated findings ? ?PLAN: Present time patient continues to do well with no evidence of disease under excellent biochemical control of his prostate cancer I will see him back in 1 year for follow-up with a repeat PSA at that time.  Patient knows to call with any concerns. ? ?I would like to take this opportunity to thank you for allowing me to participate in the care of your patient.. ?  ? Noreene Filbert, MD ? ?

## 2021-08-19 ENCOUNTER — Encounter: Payer: Self-pay | Admitting: Nurse Practitioner

## 2021-08-19 ENCOUNTER — Ambulatory Visit (INDEPENDENT_AMBULATORY_CARE_PROVIDER_SITE_OTHER): Payer: Medicare Other | Admitting: Nurse Practitioner

## 2021-08-19 ENCOUNTER — Ambulatory Visit (INDEPENDENT_AMBULATORY_CARE_PROVIDER_SITE_OTHER)
Admission: RE | Admit: 2021-08-19 | Discharge: 2021-08-19 | Disposition: A | Discharge status: Home / Self Care | Payer: Medicare Other | Source: Ambulatory Visit | Attending: Nurse Practitioner | Admitting: Nurse Practitioner

## 2021-08-19 VITALS — BP 126/64 | HR 85 | Temp 97.6°F | Resp 14 | Ht 71.0 in | Wt 215.4 lb

## 2021-08-19 DIAGNOSIS — R051 Acute cough: Secondary | ICD-10-CM

## 2021-08-19 DIAGNOSIS — R231 Pallor: Secondary | ICD-10-CM

## 2021-08-19 DIAGNOSIS — R0602 Shortness of breath: Secondary | ICD-10-CM | POA: Insufficient documentation

## 2021-08-19 DIAGNOSIS — R059 Cough, unspecified: Secondary | ICD-10-CM | POA: Diagnosis not present

## 2021-08-19 DIAGNOSIS — E1165 Type 2 diabetes mellitus with hyperglycemia: Secondary | ICD-10-CM | POA: Diagnosis not present

## 2021-08-19 DIAGNOSIS — J069 Acute upper respiratory infection, unspecified: Secondary | ICD-10-CM

## 2021-08-19 LAB — GLUCOSE, POCT (MANUAL RESULT ENTRY): POC Glucose: 86 mg/dl (ref 70–99)

## 2021-08-19 LAB — POC COVID19 BINAXNOW: SARS Coronavirus 2 Ag: NEGATIVE

## 2021-08-19 MED ORDER — BENZONATATE 100 MG PO CAPS: 100.0000 mg | ORAL_CAPSULE | Freq: Three times a day (TID) | ORAL | 0 refills | Status: AC | PRN

## 2021-08-19 MED ORDER — CETIRIZINE HCL 10 MG PO TABS: 10.0000 mg | ORAL_TABLET | Freq: Every day | ORAL | 0 refills | Status: DC

## 2021-08-19 NOTE — Progress Notes (Signed)
? ?Acute Office Visit ? ?Subjective:  ? ?  ?Patient ID: [name] Cook, male    DOB: [birth year], 71 y.o.   MRN: [id number] ? ?Chief Complaint  ?Patient presents with  ? Nasal Congestion  ?  Started 08/13/21-cough -not coughing up much, worse at night, post nasal drip. No fever or headache. Have not done a Covid test. Has taking Mucinex and Tussin cough medication.  ? ? ?HPI ?Patient is in today for  ?Symptoms started on 08/13/2021 ?No sick contacts ?Covid vaccines x 2 ?Flu utd ?Mucinex and tussin cough medicatoin ?States he has been using albuterol more than normal at 2 times a day. It helps some ? ?Review of Systems  ?Constitutional:  Positive for malaise/fatigue. Negative for chills and fever.  ?HENT:  Positive for congestion and sinus pain. Negative for ear discharge, ear pain and sore throat.   ?Respiratory:  Positive for cough (non productive) and shortness of breath (increased from baseline).   ?Musculoskeletal:  Negative for joint pain and myalgias.  ?Neurological:  Negative for headaches.  ? ? ?   ?Objective:  ?  ?BP 126/64   Pulse 85   Temp 97.6 ?F (36.4 ?C) (Oral)   Resp 14   Ht '5\' 11"'$  (1.803 m)   Wt 215 lb 6 oz (97.7 kg)   SpO2 98%   BMI 30.04 kg/m?  ?BP Readings from Last 3 Encounters:  ?08/19/21 126/64  ?08/12/21 107/79  ?07/28/21 118/62  ? ?Wt Readings from Last 3 Encounters:  ?08/19/21 215 lb 6 oz (97.7 kg)  ?08/12/21 211 lb 9.6 oz (96 kg)  ?07/28/21 212 lb (96.2 kg)  ? ?  ? ?Physical Exam ?Vitals and nursing note reviewed.  ?Constitutional:   ?   Appearance: Normal appearance.  ?HENT:  ?   Right Ear: Tympanic membrane, ear canal and external ear normal.  ?   Left Ear: Tympanic membrane, ear canal and external ear normal.  ?   Mouth/Throat:  ?   Mouth: Mucous membranes are moist.  ?   Pharynx: Oropharynx is clear.  ?Cardiovascular:  ?   Rate and Rhythm: Normal rate and regular rhythm.  ?   Heart sounds: Normal heart sounds.  ?Pulmonary:  ?   Effort: Pulmonary effort is normal.  ?   Breath sounds:  Normal breath sounds.  ?Neurological:  ?   Mental Status: He is alert.  ? ? ?Results for orders placed or performed in visit on 08/19/21  ?[place] COVID-19  ?Result Value Ref Range  ? SARS Coronavirus 2 Ag Negative Negative  ?Glucose (CBG)  ?Result Value Ref Range  ? POC Glucose 86 70 - 99 mg/dl  ? ? ? ?   ?Assessment & Plan:  ? ?Problem List Items Addressed This Visit   ? ?  ? Respiratory  ? Viral upper respiratory tract infection  ?  Given patient's presentation and length of illness likely viral in nature.  We will continue to monitor continue symptomatic treatment follow-up if no improvement. ? ?  ?  ? Relevant Medications  ? cetirizine (ZYRTEC ALLERGY) 10 MG tablet  ? benzonatate (TESSALON) 100 MG capsule  ?  ? Endocrine  ? Type 2 diabetes mellitus with hyperglycemia (HCC)  ? Relevant Orders  ? Glucose (CBG) (Completed)  ?  ? Other  ? Acute cough - Primary  ?  Likely secondary to postnasal drip.  Patient continue using Flonase as directed we will send in Tessalon Perles 100 mg 3 times daily as needed cough ? ?  ?  ?  Relevant Medications  ? benzonatate (TESSALON) 100 MG capsule  ? Other Relevant Orders  ? POC COVID-19 (Completed)  ? DG Chest 2 View  ? Clammy skin  ?  At the end of visit patient states he felt hot upon palpation patient had a clammy feeling with sweat.  Patient is diabetic has taken antidiabetic medications today last meal was several hours ago.  Did check CBG was 43 we will give patient a snack just in case he started feeling symptomatic on his way home.  Did recheck patient's temperature oral temperature within normal limits. ? ?  ?  ? Relevant Orders  ? Glucose (CBG) (Completed)  ? Shortness of breath  ?  Patient has used albuterol inhaler but states it helps more with cough breathing.  No wheezing on auscultation.  Patient's vital signs within normal limit with O2 sat 90% we will do chest x-ray pending result ? ?  ?  ? Relevant Orders  ? DG Chest 2 View  ? ? ?Meds ordered this encounter   ?Medications  ? cetirizine (ZYRTEC ALLERGY) 10 MG tablet  ?  Sig: Take 1 tablet (10 mg total) by mouth at bedtime.  ?  Dispense:  30 tablet  ?  Refill:  0  ?  Order Specific Question:   Supervising Provider  ?  Answer:   Loura Pardon A [[id number]]  ? benzonatate (TESSALON) 100 MG capsule  ?  Sig: Take 1 capsule (100 mg total) by mouth 3 (three) times daily as needed for up to 7 days for cough.  ?  Dispense:  21 capsule  ?  Refill:  0  ?  Order Specific Question:   Supervising Provider  ?  Answer:   Loura Pardon A [[id number]]  ? ? ?No follow-ups on file. ? ?Romilda Garret, NP ? ? ?

## 2021-08-19 NOTE — Assessment & Plan Note (Signed)
Given patient's presentation and length of illness likely viral in nature.  We will continue to monitor continue symptomatic treatment follow-up if no improvement. ?

## 2021-08-19 NOTE — Assessment & Plan Note (Signed)
At the end of visit patient states he felt hot upon palpation patient had a clammy feeling with sweat.  Patient is diabetic has taken antidiabetic medications today last meal was several hours ago.  Did check CBG was 40 we will give patient a snack just in case he started feeling symptomatic on his way home.  Did recheck patient's temperature oral temperature within normal limits. ?

## 2021-08-19 NOTE — Assessment & Plan Note (Signed)
Likely secondary to postnasal drip.  Patient continue using Flonase as directed we will send in Tessalon Perles 100 mg 3 times daily as needed cough ?

## 2021-08-19 NOTE — Assessment & Plan Note (Signed)
Patient has used albuterol inhaler but states it helps more with cough breathing.  No wheezing on auscultation.  Patient's vital signs within normal limit with O2 sat 90% we will do chest x-ray pending result ?

## 2021-08-19 NOTE — Patient Instructions (Signed)
Nice to see you today ?I will be in touch with xray results ?Follow up if no improvement over the weekend ?

## 2021-08-23 ENCOUNTER — Telehealth: Payer: Self-pay

## 2021-08-23 NOTE — Telephone Encounter (Signed)
[facility] - Client ?Nonclinical Telephone Record  ?[facility]? ?Client [place] Night - Client ?Client Site [place] ?Provider Alma Friendly - NP ?Contact Type Call ?Who Is Calling Patient / Member / Family / Caregiver ?Caller Name [name] Cook ?Caller Phone Number [phone] ?Call Type Message Only Information Provided ?Reason for Call Returning a Call from the Office ?Initial Comment Caller states he is returning call from office. ?Additional Comment hrs prov ?Disp. Time Disposition Final User ?08/22/2021 5:23:26 PM General Information Provided Yes Idolina Primer ?Call Closed By: Idolina Primer ?Transaction Date/Time: 08/22/2021 5:21:49 PM (ET ?

## 2021-08-23 NOTE — Telephone Encounter (Signed)
See xray results. Called and left a message to call back ?

## 2021-08-23 NOTE — Telephone Encounter (Signed)
Pt called back and per [name] "chest xray was ok nothing acute showing" pt is aware and had no further questions.  ?

## 2021-08-26 ENCOUNTER — Other Ambulatory Visit: Payer: Self-pay | Admitting: Primary Care

## 2021-08-26 DIAGNOSIS — E119 Type 2 diabetes mellitus without complications: Secondary | ICD-10-CM

## 2021-08-28 ENCOUNTER — Other Ambulatory Visit: Payer: Self-pay | Admitting: Primary Care

## 2021-08-28 ENCOUNTER — Other Ambulatory Visit: Payer: Self-pay | Admitting: Internal Medicine

## 2021-08-28 DIAGNOSIS — E785 Hyperlipidemia, unspecified: Secondary | ICD-10-CM

## 2021-08-29 NOTE — Telephone Encounter (Signed)
Pt's medication was on hold for a sugery in 2023, does pt supposed to resume taking clopidogrel? Please address ?

## 2021-08-30 ENCOUNTER — Telehealth: Payer: Self-pay

## 2021-08-30 MED ORDER — AMOXICILLIN-POT CLAVULANATE 875-125 MG PO TABS: 1.0000 | ORAL_TABLET | Freq: Two times a day (BID) | ORAL | 0 refills | Status: DC

## 2021-08-30 NOTE — Telephone Encounter (Signed)
Antibiotic sent in. Patient is on warfarin. Can we alert the person who manages that please. I think he sees Larene Beach through our clinic ?

## 2021-08-30 NOTE — Telephone Encounter (Signed)
Pt called in to report that he is not feeling any better and was told to call about an antibiotic. Pt is coughing up yellow mucous and states that he gets hot and cold.  ?Please advise.  ?

## 2021-08-30 NOTE — Telephone Encounter (Signed)
Sending message to Randall An about this. Left message for patient to call back to be advised. ?

## 2021-08-30 NOTE — Telephone Encounter (Signed)
Contacted pt's fiance, [name] Cook, and advised of the abx and to change coumadin clinic apt for 5/25 at 8:30. Advised to change dosing to 1/2 tablet daily except take nothing on Saturday and Tuesday. Advised if any questions or changes to contact the coumadin clinic. [name] Cook read back dosing instructions and verbalized understanding.  ?

## 2021-08-31 NOTE — Telephone Encounter (Signed)
Noted! Thank you

## 2021-09-07 ENCOUNTER — Ambulatory Visit (INDEPENDENT_AMBULATORY_CARE_PROVIDER_SITE_OTHER): Payer: Medicare Other

## 2021-09-07 ENCOUNTER — Telehealth: Payer: Self-pay

## 2021-09-07 ENCOUNTER — Other Ambulatory Visit: Payer: Medicare Other

## 2021-09-07 DIAGNOSIS — Z7901 Long term (current) use of anticoagulants: Secondary | ICD-10-CM | POA: Diagnosis not present

## 2021-09-07 LAB — POCT INR: INR: 1.1 — AB (ref 2.0–3.0)

## 2021-09-07 NOTE — Patient Instructions (Addendum)
Pre visit review using our clinic review tool, if applicable. No additional management support is needed unless otherwise documented below in the visit note.  Increase dose today to take 5 mg and increase dose tomorrow to take 7.5 mg and then continue 2.'5mg'$  daily except take '5mg'$  on Mondays and Thursdays. Recheck in 1 week.

## 2021-09-07 NOTE — Progress Notes (Signed)
Contacted pt by phone and advised of dosing changes. Scheduled pt for 5/31 at [facility] per pt request. Pt verbalized understanding.

## 2021-09-07 NOTE — Telephone Encounter (Signed)
Pt walked into [facility] clinic today reporting he could not make his apt tomorrow for coumadin clinic and wanted to know if he could be tested there today.  Lab performed a POCT INR  and a result of 1.1 was received. Pt's dose was recently lowered due to abx prescribed had interaction.   Will contact pt for dosing and document in anticoagulation encounter.

## 2021-09-07 NOTE — Progress Notes (Signed)
Increase dose today to take 5 mg and increase dose tomorrow to take 7.5 mg and then continue 2.'5mg'$  daily except take '5mg'$  on Mondays and Thursdays. Recheck in 1 week.

## 2021-09-08 ENCOUNTER — Telehealth: Payer: Self-pay

## 2021-09-08 ENCOUNTER — Ambulatory Visit: Payer: Medicare Other

## 2021-09-08 NOTE — Chronic Care Management (AMB) (Signed)
Chronic Care Management Pharmacy Assistant   Name: [name] Cook  MRN: [id number] DOB: 04-19-1950  Reason for Encounter: Reminder Call    Medications: Outpatient Encounter Medications as of 09/08/2021  Medication Sig   albuterol (VENTOLIN HFA) 108 (90 Base) MCG/ACT inhaler INHALE 2 PUFFS BY MOUTH EVERY 4 HOURS AS NEEDED FOR WHEEZE OR FOR SHORTNESS OF BREATH   amoxicillin-clavulanate (AUGMENTIN) 875-125 MG tablet Take 1 tablet by mouth 2 (two) times daily.   blood glucose meter kit and supplies KIT Dispense based on patient and insurance preference. Use up to four times daily as directed. (FOR ICD-9 250.00, 250.01).   budesonide-formoterol (SYMBICORT) 160-4.5 MCG/ACT inhaler INHALE 2 PUFFS INTO THE LUNGS TWICE A DAY   cetirizine (ZYRTEC ALLERGY) 10 MG tablet Take 1 tablet (10 mg total) by mouth at bedtime.   clopidogrel (PLAVIX) 75 MG tablet TAKE 1 TABLET BY MOUTH EVERY DAY   diclofenac Sodium (VOLTAREN) 1 % GEL Apply 2 g topically 4 (four) times daily.   DULoxetine (CYMBALTA) 30 MG capsule Take 1 capsule (30 mg total) by mouth daily. For depression. Take with 60 mg dose.   DULoxetine (CYMBALTA) 60 MG capsule TAKE 1 CAPSULE (60 MG TOTAL) BY MOUTH DAILY. FOR DEPRESSION.   empagliflozin (JARDIANCE) 25 MG TABS tablet Take 1 tablet (25 mg total) by mouth daily before breakfast. For diabetes.   fish oil-omega-3 fatty acids 1000 MG capsule Take 1 g by mouth daily.     fluticasone (FLONASE) 50 MCG/ACT nasal spray PLACE 1 SPRAY INTO BOTH NOSTRILS DAILY AS NEEDED FOR ALLERGIES OR RHINITIS.   furosemide (LASIX) 20 MG tablet TAKE 1 TABLET BY MOUTH EVERY DAY   gabapentin (NEURONTIN) 300 MG capsule Take 1 capsule by mouth every morning and 2 capsules by mouth at night for neuropathy   glipiZIDE (GLIPIZIDE XL) 10 MG 24 hr tablet Take 1 tablet (10 mg total) by mouth daily with breakfast. For diabetes.   glucose blood test strip 1 each by Other route as needed for other. Use to check blood sugar up to  three times a day-Accu-Chek meter   isosorbide mononitrate (IMDUR) 60 MG 24 hr tablet TAKE 1 TABLET BY MOUTH EVERY DAY   lisinopril (ZESTRIL) 2.5 MG tablet TAKE 1 TABLET BY MOUTH EVERY DAY   nitroGLYCERIN (NITROSTAT) 0.4 MG SL tablet Place 1 tablet (0.4 mg total) under the tongue every 5 (five) minutes as needed for chest pain.   pantoprazole (PROTONIX) 40 MG tablet TAKE 1 TABLET (40 MG TOTAL) BY MOUTH DAILY FOR HEARTBURN.   rosuvastatin (CRESTOR) 20 MG tablet TAKE 1 TABLET BY MOUTH EVERY DAY IN THE EVENING FOR CHOLESTEROL   tamsulosin (FLOMAX) 0.4 MG CAPS capsule TAKE 1 CAPSULE (0.4 MG TOTAL) BY MOUTH 2 (TWO) TIMES DAILY.   warfarin (COUMADIN) 5 MG tablet TAKE 1/2 TABLET DAILY BY MOUTH EXCEPT TAKE 1 TABLET ON MONDAYS AND THURSDAYS OR AS DIRECTED BY ANTICOAGULATION CLINIC   No facility-administered encounter medications on file as of 09/08/2021.    [name] Cook was contacted to remind of upcoming telephone visit with Charlene Brooke on 09/13/21 at 8:45am. Patient was reminded to have any blood glucose and blood pressure readings available for review at appointment.   Message was left reminding patient of appointment.   CCM referral has been placed prior to visit?  No    Star Rating Drugs: Medication:  Last Fill: Day Supply Rosuvastatin 57m 08/28/21 90 Lisinopril 2.516m4/24/23 90 Glipizide 104m/22/23 90 Jardiance 85m65m9/23  90  Januvia 186m 05/28/21 90 Metformin 5080m3/17/23 90[place] notified  Ve[place] SensorCCSouth [phone]

## 2021-09-11 ENCOUNTER — Other Ambulatory Visit: Payer: Self-pay | Admitting: Nurse Practitioner

## 2021-09-11 DIAGNOSIS — J069 Acute upper respiratory infection, unspecified: Secondary | ICD-10-CM

## 2021-09-13 ENCOUNTER — Ambulatory Visit: Payer: Medicare Other | Admitting: Pharmacist

## 2021-09-13 ENCOUNTER — Telehealth: Payer: Self-pay | Admitting: Internal Medicine

## 2021-09-13 DIAGNOSIS — E1165 Type 2 diabetes mellitus with hyperglycemia: Secondary | ICD-10-CM

## 2021-09-13 DIAGNOSIS — F3341 Major depressive disorder, recurrent, in partial remission: Secondary | ICD-10-CM

## 2021-09-13 DIAGNOSIS — I4819 Other persistent atrial fibrillation: Secondary | ICD-10-CM

## 2021-09-13 DIAGNOSIS — J439 Emphysema, unspecified: Secondary | ICD-10-CM

## 2021-09-13 DIAGNOSIS — I10 Essential (primary) hypertension: Secondary | ICD-10-CM

## 2021-09-13 DIAGNOSIS — I200 Unstable angina: Secondary | ICD-10-CM

## 2021-09-13 DIAGNOSIS — E785 Hyperlipidemia, unspecified: Secondary | ICD-10-CM

## 2021-09-13 NOTE — Telephone Encounter (Signed)
Pt c/o Shortness Of Breath: STAT if SOB developed within the last 24 hours or pt is noticeably SOB on the phone  1. Are you currently SOB (can you hear that pt is SOB on the phone)? no  2. How long have you been experiencing SOB? Since about last Thursday (5/25), but it's gotten worse since then.   3. Are you SOB when sitting or when up moving around? Up moving around  4. Are you currently experiencing any other symptoms? Legs are real weak, and he feels dizzy.

## 2021-09-13 NOTE — Telephone Encounter (Signed)
Returned call to Pt.  Per Pt he is having trouble breathing.  Pt did sound short of breath on the phone.  He states he weighs himself daily and has not noticed any weight gain.  States he thinks he weighs less.  He denies any peripheral edema.  He denies chest pain.  Pt has history of CAD with stents.  Pt scheduled for DOD visit tomorrow with Dr. Marlou Porch for evaluation.

## 2021-09-13 NOTE — Progress Notes (Unsigned)
Chronic Care Management Pharmacy Note  12/21/20 Name:  [name] Cook MRN:  [id number] DOB:  18-[name]-1951  Summary: CCM F/U visit -Pt reports mood has improved with extra dose of duloxetine (90 mg/day total) -Pt reports highest fasting BG has been about 150, he denies low sugars -  Recommendations/Changes made from today's visit: -No med changes; follow up with coumadin clinic as scheduled  Plan: -[name] will call patient 2 months for BP/BG log -Pharmacist follow up televisit scheduled for 3 months -PCP appt 10/26/21; Coumadin appt 09/14/21   Subjective: [name] Cook is an 71 y.o. year old male who is a primary patient of [name], NP.  The CCM team was consulted for assistance with disease management and care coordination needs.    Engaged with patient by telephone for follow up visit in response to provider referral for pharmacy case management and/or care coordination services.   Consent to Services:  The patient was given information about Chronic Care Management services, agreed to services, and gave verbal consent prior to initiation of services.  Please see initial visit note for detailed documentation.   Patient Care Team: [name], NP as PCP - General (Internal Medicine) [name], MD as PCP - Cardiology (Cardiology) [name], MD as PCP - Electrophysiology (Cardiology) Rothbart, Cristopher Estimable, MD (Inactive) (Cardiology) Noreene Filbert, MD as Radiation Oncologist (Radiation Oncology) Charlton Haws, [facility] as Pharmacist (Pharmacist)  Recent office visits: 08/19/21 NP Romilda Garret OV: acute cough - rx'd cetirizine, benzonate. Covid negaive. 07/28/21 NP Alma Friendly OV: f/u DM, depression. Increased duloxetine to 90 mg/day. Increase gabapentin to 2 cap at night. Work on diet changes.  06/23/21 NP Alma Friendly OV: f/u DM. Increase Jardiance to 25 mg daily. F/u with GI regarding diarrhea. Lipase elevated, ordered CT scan - negative  for pancreatitis.  06/08/21 Phone note - BG has elevated to 212. Started glipizide 10 mg. 05/27/21 NP Alma Friendly OV: c/o diarrhea. Stop metformin. Complete stool sample kit. Referred for colonoscopy. Lipase elevated, concern for pancreatitis, advised to stop Januvia as well.  04/08/21 NP Alma Friendly OV: f/u DM, depression. Pt did not increase metformin as instructed last visit. A1c 7.0. No med changes. F/u 6 months.  03/23/21 NP Alma Friendly OV: f/u fatigue, depression. Increase duloxetine to 60 mg daily. Increase metformin to 2 tab AM or PM.  03/07/21 NP Karl Ito OV: acute bronchitis. Rx'd Augmentin and prednisone. 03/02/21 NP Karl Ito VV: c/o acute cough. Negative flu and covid tests. Rx'd guafenesin-codeine syrup.  02/02/21 NP Alma Friendly OV: c/o diarrhea. Reduce metformin to 500 mg BID.   12/30/20 NP Alma Friendly OV: f/u DM. Stop citalopram and start duloxetine 30 mg daily. Change metformin to ER 500 mg and increase to 2 tab BID.   11/22/20 - Family Medicine - Patient presented for follow up abdomen pain. Improving on current regimen. Advised to finish taking the antibiotic as prescribed earlier. Follow up if symptoms do not improve.  Recent consult visits:  06/15/21 PA Zara Council (Urology): f/u ED, prostate cancer. Scheduled ICI titration. 06/02/21 Dr Vicente Males (GI): c/o diarrhea. Resolved after stopping Januvia and metformin. Advised to stop diclofenac and diet soda. No further w/u for diarrhea as it has resolved. Scheduled surveillance colonoscopy. 06/02/21 Dr [name] (cardiology): f/u Afib. Stop metoprolol. 04/29/21 NP Eric Form (Pulmonary): hx tobacco use 02/15/21 PA Zara Council (Urology): f/u ED, prostate cancer. Not a candidate for PDE-5 due to nitrate use. S/p EBRT and Lupron  for Prostate Ca 2020. 11/23/20 - Cardiology - Patient presented for routine electrophysiology follow up. No medication changes. 06/15/20 - Urology - Patient presented for follow up prostate  cancer. Currently on Flomax twice daily, may titrate off if his symptoms are well controlled.  Hospital visits: None in previous 6 months   Objective:  Lab Results  Component Value Date   CREATININE 1.10 07/01/2021   BUN 22 07/01/2021   GFR 68.68 07/01/2021   GFRNONAA >60 01/23/2020   GFRAA 72 06/23/2019   NA 137 07/01/2021   K 4.0 07/01/2021   CALCIUM 9.3 07/01/2021   CO2 27 07/01/2021   GLUCOSE 176 (H) 07/01/2021    Lab Results  Component Value Date/Time   HGBA1C 7.0 (H) 05/10/2021 09:50 AM   HGBA1C 6.5 (A) 01/31/2021 09:45 AM   HGBA1C 7.6 (H) 10/07/2020 10:18 AM   HGBA1C 6.4 (H) 11/27/2016 11:33 AM   HGBA1C 7.1 (H) 08/21/2016 10:25 AM   GFR 68.68 07/01/2021 08:02 AM   GFR 63.25 03/23/2021 02:59 PM   MICROALBUR <0.7 09/21/2017 01:06 PM   MICROALBUR <0.2 11/27/2016 11:33 AM    Last diabetic Eye exam:  Lab Results  Component Value Date/Time   HMDIABEYEEXA No Retinopathy 08/28/2020 12:00 AM    Last diabetic Foot exam: 09/2019 PCP  Lab Results  Component Value Date   CHOL 131 10/07/2020   HDL 43.90 10/07/2020   LDLCALC 55 10/07/2020   TRIG 161.0 (H) 10/07/2020   CHOLHDL 3 10/07/2020       Latest Ref Rng & Units 11/16/2020   12:30 PM 10/07/2020   10:18 AM 01/23/2020    9:36 PM  Hepatic Function  Total Protein 6.0 - 8.3 g/dL 6.6   6.5   7.2    Albumin 3.5 - 5.2 g/dL 4.2   4.3   4.3    AST 0 - 37 U/L '21   21   20    ' ALT 0 - 53 U/L '20   20   19    ' Alk Phosphatase 39 - 117 U/L 44   51   55    Total Bilirubin 0.2 - 1.2 mg/dL 0.6   0.6   0.9      Lab Results  Component Value Date/Time   TSH 2.030 04/15/2019 10:05 AM   TSH 1.93 10/27/2015 10:54 AM   FREET4 1.38 03/14/2013 11:12 AM   FREET4 1.11 05/10/2010 10:02 AM       Latest Ref Rng & Units 05/27/2021    3:15 PM 03/23/2021    2:59 PM 11/16/2020   12:30 PM  CBC  WBC 3.8 - 10.8 Thousand/uL 6.9   6.6   4.7    Hemoglobin 13.2 - 17.1 g/dL 15.4   14.6   14.6    Hematocrit 38.5 - 50.0 % 44.7   43.6   43.9     Platelets 140 - 400 Thousand/uL 196   194.0   118.0      Lab Results  Component Value Date/Time   VD25OH 32 10/27/2015 10:54 AM    Clinical ASCVD: Yes  The ASCVD Risk score (Arnett DK, et al., 2019) failed to calculate for the following reasons:   The patient has a prior MI or stroke diagnosis       04/08/2021    8:53 AM 02/02/2021   11:52 AM 12/30/2020   12:35 PM  Depression screen PHQ 2/9  Decreased Interest 0 0 2  Down, Depressed, Hopeless 0 0 0  PHQ - 2 Score  0 0 2  Altered sleeping 0 0 3  Tired, decreased energy 2 0 3  Change in appetite 0 0 0  Feeling bad or failure about yourself  0 0 0  Trouble concentrating 0 0 0  Moving slowly or fidgety/restless 0 0 0  Suicidal thoughts 0 0 0  PHQ-9 Score 2 0 8  Difficult doing work/chores Not difficult at all Not difficult at all Not difficult at all     Social History   Tobacco Use  Smoking Status Former   Packs/day: 1.00   Years: 42.00   Pack years: 42.00   Types: Cigarettes   Quit date: 12/30/2013   Years since quitting: 7.7  Smokeless Tobacco Never   BP Readings from Last 3 Encounters:  08/19/21 126/64  08/12/21 107/79  07/28/21 118/62   Pulse Readings from Last 3 Encounters:  08/19/21 85  08/12/21 79  07/28/21 88   Wt Readings from Last 3 Encounters:  08/19/21 215 lb 6 oz (97.7 kg)  08/12/21 211 lb 9.6 oz (96 kg)  07/28/21 212 lb (96.2 kg)   BMI Readings from Last 3 Encounters:  08/19/21 30.04 kg/m  08/12/21 29.51 kg/m  07/28/21 29.57 kg/m    Assessment/Interventions: Review of patient past medical history, allergies, medications, health status, including review of consultants reports, laboratory and other test data, was performed as part of comprehensive evaluation and provision of chronic care management services.   SDOH:  (Social Determinants of Health) assessments and interventions performed: Yes   SDOH Screenings   Alcohol Screen: Low Risk    Last Alcohol Screening Score (AUDIT): 0   Depression (PHQ2-9): Low Risk    PHQ-2 Score: 2  Financial Resource Strain: Low Risk    Difficulty of Paying Living Expenses: Not very hard  Food Insecurity: No Food Insecurity   Worried About Charity fundraiser in the Last Year: Never true   Ran Out of Food in the Last Year: Never true  Housing: Low Risk    Last Housing Risk Score: 0  Physical Activity: Inactive   Days of Exercise per Week: 0 days   Minutes of Exercise per Session: 0 min  Social Connections: Not on file  Stress: No Stress Concern Present   Feeling of Stress : Not at all  Tobacco Use: Medium Risk   Smoking Tobacco Use: Former   Smokeless Tobacco Use: Never   Passive Exposure: Not on file  Transportation Needs: No Transportation Needs   Lack of Transportation (Medical): No   Lack of Transportation (Non-Medical): No    CCM Care Plan  Allergies  Allergen Reactions   Januvia [Sitagliptin] Other (See Comments)    Elevated lipase   Metformin And Related Diarrhea    Medications Reviewed Today     Reviewed by Kris Mouton, CMA (Certified Medical Assistant) on 08/19/21 at 1443  Med List Status: <None>   Medication Order Taking? Sig Documenting Provider Last Dose Status Informant  albuterol (VENTOLIN HFA) 108 (90 Base) MCG/ACT inhaler [id number]  INHALE 2 PUFFS BY MOUTH EVERY 4 HOURS AS NEEDED FOR WHEEZE OR FOR SHORTNESS OF Nicholes Stairs, NP  Active   blood glucose meter kit and supplies KIT [id number]  Dispense based on patient and insurance preference. Use up to four times daily as directed. (FOR ICD-9 250.00, 250.01). [name], NP  Active   budesonide-formoterol [facility]) 160-4.5 MCG/ACT inhaler [id number]  INHALE 2 PUFFS INTO THE LUNGS TWICE A DAY [name], NP  Active  clopidogrel (PLAVIX) 75 MG tablet [id number]  TAKE 1 TABLET BY MOUTH EVERY DAY [name], MD  Active            Med Note Charlton Haws   Fri Jun 10, 2021  8:56 AM) On hold for colonoscopy  06/13/21  diclofenac Sodium (VOLTAREN) 1 % GEL [id number]  Apply 2 g topically 4 (four) times daily. Khatri, Hina, PA-C  Active   DULoxetine (CYMBALTA) 30 MG capsule [id number]  Take 1 capsule (30 mg total) by mouth daily. For depression. Take with 60 mg dose. [name], NP  Active   DULoxetine (CYMBALTA) 60 MG capsule [id number]  TAKE 1 CAPSULE (60 MG TOTAL) BY MOUTH DAILY. FOR DEPRESSION. [name], NP  Active   empagliflozin (JARDIANCE) 25 MG TABS tablet [id number]  Take 1 tablet (25 mg total) by mouth daily before breakfast. For diabetes. [name], NP  Active   fish oil-omega-3 fatty acids 1000 MG capsule [id number]  Take 1 g by mouth daily.   [provider]  Active Self  fluticasone (FLONASE) 50 MCG/ACT nasal spray [id number]  PLACE 1 SPRAY INTO BOTH NOSTRILS DAILY AS NEEDED FOR ALLERGIES OR RHINITIS. [name], NP  Active   furosemide (LASIX) 20 MG tablet [id number]  TAKE 1 TABLET BY MOUTH EVERY DAY [name], MD  Active   gabapentin (NEURONTIN) 300 MG capsule [id number]  Take 1 capsule by mouth every morning and 2 capsules by mouth at night for neuropathy [name], NP  Active   glipiZIDE (GLIPIZIDE XL) 10 MG 24 hr tablet [id number]  Take 1 tablet (10 mg total) by mouth daily with breakfast. For diabetes. [name], NP  Active   glucose blood test strip [id number]  1 each by Other route as needed for other. Use to check blood sugar up to three times a day-Accu-Chek meter [provider]  Active   isosorbide mononitrate (IMDUR) 60 MG 24 hr tablet [id number]  TAKE 1 TABLET BY MOUTH EVERY DAY [name], MD  Active   lisinopril (ZESTRIL) 2.5 MG tablet [id number]  TAKE 1 TABLET BY MOUTH EVERY DAY [name], MD  Active   nitroGLYCERIN (NITROSTAT) 0.4 MG SL tablet [id number]  Place 1 tablet (0.4 mg total) under the tongue every 5 (five) minutes as needed for chest pain. [name], NP  Active   pantoprazole (PROTONIX)  40 MG tablet [id number]  TAKE 1 TABLET (40 MG TOTAL) BY MOUTH DAILY FOR HEARTBURN. [name], NP  Active   rosuvastatin (CRESTOR) 20 MG tablet [id number]  TAKE 1 TABLET BY MOUTH EVERY DAY IN THE EVENING FOR CHOLESTEROL [name], NP  Active   tamsulosin (FLOMAX) 0.4 MG CAPS capsule [id number]  TAKE 1 CAPSULE (0.4 MG TOTAL) BY MOUTH 2 (TWO) TIMES DAILY. Noreene Filbert, MD  Active   warfarin (COUMADIN) 5 MG tablet [id number]  TAKE 1/2 TABLET DAILY BY MOUTH EXCEPT TAKE 1 TABLET ON MONDAYS AND THURSDAYS OR AS DIRECTED BY ANTICOAGULATION CLINIC [name], NP  Active             Patient Active Problem List   Diagnosis Date Noted   Clammy skin 08/19/2021   Shortness of breath 08/19/2021   Neuropathy due to secondary diabetes ([place]) 07/28/2021   Elevated lipase 06/23/2021   Acute bronchitis with COPD ([place]) 03/07/2021   Acute cough 03/02/2021   Body aches 03/02/2021   Fever 03/02/2021   Chest wall pain  11/22/2020   Left lower quadrant abdominal pain 10/26/2020   History of diverticulitis 02/06/2020   Preventative health care 10/09/2019   Ear canal dryness, bilateral 08/12/2019   Polyp of colon    Decreased renal function 06/27/2018   Prostate cancer ([place]) 06/10/2018   Chest pain 06/04/2018   OSA (obstructive sleep apnea) 10/01/2017   Encounter for screening colonoscopy 10/01/2017   Viral upper respiratory tract infection 08/10/2017   Osteoarthritis 06/26/2017   Insomnia 10/01/2014   Diarrhea 04/13/2014   Depression 03/09/2014   Coronary artery disease 11/06/2013   Long term current use of anticoagulant therapy 06/19/2013   Pacemaker 04/30/2013   Acute myocardial infarction, subendocardial infarction, initial episode of care ([place]) 03/15/2013   Abnormal LFTs 05/02/2012   Chronic kidney disease, 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/26/2011   Chronic obstructive pulmonary disease ([place]) 04/20/2011   Atrial fibrillation    Hypertension    Hyperlipidemia    Type 2 diabetes  mellitus with hyperglycemia ([place])    Atrial flutter ([place])    GASTROESOPHAGEAL REFLUX DISEASE 05/16/2010    Immunization History  Administered Date(s) Administered   Fluad Quad(high Dose 65+) 02/02/2021   Influenza, High Dose Seasonal PF 02/20/2018, 02/03/2019   Influenza,inj,Quad PF,6+ Mos 02/05/2014, 01/04/2015, 04/20/2016   Influenza-Unspecified 01/05/2011, 01/15/2013, 01/30/2020   PFIZER(Purple Top)SARS-COV-2 Vaccination 09/17/2019, 10/08/2019   Pneumococcal Conjugate-13 09/21/2017   Pneumococcal Polysaccharide-23 01/05/2011   Tdap 01/05/2011    Conditions to be addressed/monitored:  Hypertension, Hyperlipidemia, Diabetes, COPD, and Depression  There are no care plans that you recently modified to display for this patient.      Medication Assistance: None required.  Patient affirms current coverage meets needs. (Medicare/Medicaid)  Compliance/Adherence/Medication fill history: Care Gaps: None  Star-Rating Drugs: Rosuvastatin - LF 03/04/21 x 90 ds; PDC 100% Jardiance - LF 05/16/21 x 90 ds; PDC 100% Lisinopril - LF 04/16/21 x 90 ds; PDC 99%  Patient's preferred pharmacy is:  [id number] [place] [address]Phone: [phone]: [phone] [id number] [address]Phone: [phone]: [phone]  Uses pill box? Yes, girlfriend fills his pillbox weekly Pt endorses 100% compliance  We discussed: Benefits of medication synchronization, packaging and delivery as well as enhanced pharmacist oversight with [employer]. Patient decided to: Continue current medication management strategy -He would like to consider pill packs.  Care Plan and Follow Up Patient Decision:  Patient agrees to Care Plan and Follow-up.  Follow Up Plan: Telephone follow up appointment  with care management team member scheduled for: 3 months  LCharlene Brooke PharmD, BCACP Clinical Pharmacist [phone]  Current Barriers:  Unable to independently monitor therapeutic efficacy Unable to maintain control of blood sugar  Pharmacist Clinical Goal(s):  Patient will achieve adherence to monitoring guidelines and medication adherence to achieve therapeutic efficacy adhere to plan to optimize therapeutic regimen for BG as evidenced by report of adherence to recommended medication management changes through collaboration with PharmD and provider.   Interventions: 1:1 collaboration with CPleas Koch NP regarding development and update of comprehensive plan of care as evidenced by provider attestation and co-signature Inter-disciplinary care team collaboration (see longitudinal plan of care) Comprehensive medication review performed; medication list updated in electronic medical record  Hypertension / Heart Failure (BP goal <130/80) -Unknown control - pt was recently taken  off metoprolol and has not checked BP at home since -Hx OSA, unable to tolerate CPAP -Per cardiology note, pt has CHF (combined) as well. He is on Lasix. -LVEF 55-60% (11/25/20); mild Left ventricular hypertrophy -Current home BP readings: not checking currently -Current treatment: Lisinopril 2.5 mg daily -Appropriate, Effective, Safe, Accessible Furosemide 20 mg daily -Appropriate, Effective, Safe, Accessible Isosorbide Mononitrate 60 mg daily -Appropriate, Effective, Safe, Accessible -Medications previously tried: none reported  -Educated on BP goals and benefits of medications for prevention of heart attack, stroke and kidney damage; -Counseled to monitor BP at home a few days a week -Recommended to continue current medication   Atrial Fibrillation (Goal: prevent stroke and major bleeding) -Controlled - per patient report no symptoms, INR  stable -Followed by cardiology, INR per [facility]: 5 -Current treatment: Warfarin 5 mg as directed (on hold for colonoscopy 2/27) -Appropriate, Effective, Safe, Accessible -Medications previously tried: metoprolol; no DOAC history due to cost concern -Counseled on importance of adherence to anticoagulant exactly as prescribed; avoidance of NSAIDs due to increased bleeding risk with anticoagulants; -Recommended to continue current medication  Hyperlipidemia/CAD: (LDL goal < 70) -Controlled - LDL 55 -CAD, stent Feb 2020, followed by cardiology -Pt denies any chest pain or abnormal symptoms. Affirms daily adherence.  -Current treatment: Rosuvastatin 20 mg daily - Appropriate, Effective, Safe, Accessible Clopidogrel 75 mg daily (on hold for colonoscopy 2/27) -Appropriate, Effective, Safe, Accessible Isosorbide Mononitrate 60 mg daily -Appropriate, Effective, Safe, Accessible Nitroglycerin - PRN  -Appropriate, Effective, Safe, Accessible Fish Oil 1000 mg daily -Appropriate, Effective, Safe, Accessible -Medications previously tried: none reported  -Educated on Cholesterol goals;  -Recommended to continue current medication  Diabetes (A1c goal <7%) -Not ideally controlled - pt was recently taken off metformin and Januvia due to diarrhea and elevated lipase, respectively; he reports BG has been >200; PCP has prescribed glipizide but pt has not picked it up yet -Current home glucose readings - checks fasting 3 days per week fasting glucose: 220 -Current medications: Jardiance 25 mg daily - Appropriate, Query Effective, Safe, Accessible Glipizide XL 10 mg daily - Appropriate, Query Effective, Safe, Accessible -Medications previously tried: metformin (diarrhea), Januvia (elevated lipase) -Advised to pick up glipizide and take as directed; call office with any BG issues (if it remains high or goes too low < 70)  COPD/OSA (Goal: control symptoms and prevent exacerbations) -Controlled -   per patient report, no issues with breathing/wheezing -No active tobacco use. Quit 2013. Low dose CT 05/06/21 normal.  -No PFTs found. No CAT score documented. -Current treatment  Symbicort 160-4.5 mcg/act - 2 puff BID (takes PRN) - Appropriate, Effective, Safe, Accessible Albuterol PRN - Appropriate, Effective, Safe, Accessible -Medications previously tried: none reported -Exacerbations requiring treatment in last 6 months: none -Patient denies consistent use of maintenance inhaler. He uses Symbicort PRN -Frequency of rescue inhaler use: ~ 1-2 x/week -Counseled on Differences between maintenance and rescue inhalers -Recommended to continue current medication for now.  Depression/Insomnia (Goal: Improve mood, sleep) -Controlled -Caffeine: 2 cups coffee, drinks coke zero - 2 cans/day. No alcohol or tobacco use -PHQ9= 2 (03/2021) - no/minimal depression -Current treatment: Duloxetine 60 mg daily Duloxetine 30 mg daily -Medications previously tried/failed: Lexapro 10 mg  and Bupropion 150 mg BID, citalopram -Recommended to continue current medication  Health Maintenance -Vaccine gaps: Prevnar20 or PPSV23, TDAP -Hx prostate cancer, x/p radiation and Lupron early 2020  Patient Goals/Self-Care Activities Patient will:  - take medications as prescribed check glucose 3 days per week, document, and provide  at future appointments check blood pressure 3 days per week, document, and provide at future appointments

## 2021-09-13 NOTE — Progress Notes (Signed)
Chronic Care Management Pharmacy Note  12/21/20 Name:  [name] Cook MRN:  [id number] DOB:  [birth year]  Summary: CCM F/U visit -Reviewed medications; pt affirms adherence as prescribed -Pt reports mood has improved with extra dose of duloxetine (90 mg/day total) -Pt reports highest fasting BG has been about 150, he denies low sugars on glipizide and Jardiance  Recommendations/Changes made from today's visit: -No med changes; follow up with coumadin clinic as scheduled  Plan: -[place] will call patient 4 month for DM update -Pharmacist follow up televisit scheduled for 6 months -PCP appt 10/26/21   Subjective: [name] Cook is an 71 y.o. year old male who is a primary patient of [name], NP.  The CCM team was consulted for assistance with disease management and care coordination needs.    Engaged with patient by telephone for follow up visit in response to provider referral for pharmacy case management and/or care coordination services.   Consent to Services:  The patient was given information about Chronic Care Management services, agreed to services, and gave verbal consent prior to initiation of services.  Please see initial visit note for detailed documentation.   Patient Care Team: [name], NP as PCP - General (Internal Medicine) [name], MD as PCP - Electrophysiology (Cardiology) Jerline Pain, MD as PCP - Cardiology (Cardiology) Rothbart, Cristopher Estimable, MD (Inactive) (Cardiology) Noreene Filbert, MD as Radiation Oncologist (Radiation Oncology) Charlton Haws, [facility] as Pharmacist (Pharmacist)  Recent office visits: 08/19/21 NP Romilda Garret OV: acute cough - rx'd cetirizine, benzonate. Covid negaive. 07/28/21 NP Alma Friendly OV: f/u DM, depression. Increased duloxetine to 90 mg/day. Increase gabapentin to 2 cap at night. Work on diet changes.  06/23/21 NP Alma Friendly OV: f/u DM. Increase Jardiance to 25 mg daily. F/u with GI  regarding diarrhea. Lipase elevated, ordered CT scan - negative for pancreatitis.  06/08/21 Phone note - BG has elevated to 212. Started glipizide 10 mg. 05/27/21 NP Alma Friendly OV: c/o diarrhea. Stop metformin. Complete stool sample kit. Referred for colonoscopy. Lipase elevated, concern for pancreatitis, advised to stop Januvia as well.  04/08/21 NP Alma Friendly OV: f/u DM, depression. Pt did not increase metformin as instructed last visit. A1c 7.0. No med changes. F/u 6 months.  03/23/21 NP Alma Friendly OV: f/u fatigue, depression. Increase duloxetine to 60 mg daily. Increase metformin to 2 tab AM or PM.  03/07/21 NP Karl Ito OV: acute bronchitis. Rx'd Augmentin and prednisone. 03/02/21 NP Karl Ito VV: c/o acute cough. Negative flu and covid tests. Rx'd guafenesin-codeine syrup.  02/02/21 NP Alma Friendly OV: c/o diarrhea. Reduce metformin to 500 mg BID.   12/30/20 NP Alma Friendly OV: f/u DM. Stop citalopram and start duloxetine 30 mg daily. Change metformin to ER 500 mg and increase to 2 tab BID.   11/22/20 - Family Medicine - Patient presented for follow up abdomen pain. Improving on current regimen. Advised to finish taking the antibiotic as prescribed earlier. Follow up if symptoms do not improve.  Recent consult visits:  06/15/21 PA Zara Council (Urology): f/u ED, prostate cancer. Scheduled ICI titration. 06/02/21 Dr Vicente Males (GI): c/o diarrhea. Resolved after stopping Januvia and metformin. Advised to stop diclofenac and diet soda. No further w/u for diarrhea as it has resolved. Scheduled surveillance colonoscopy. 06/02/21 Dr [name] (cardiology): f/u Afib. Stop metoprolol. 04/29/21 NP Eric Form (Pulmonary): hx tobacco use 02/15/21 PA Zara Council (Urology): f/u ED, prostate cancer. Not a candidate for PDE-5 due  to nitrate use. S/p EBRT and Lupron for Prostate Ca 2020. 11/23/20 - Cardiology - Patient presented for routine electrophysiology follow up. No medication  changes. 06/15/20 - Urology - Patient presented for follow up prostate cancer. Currently on Flomax twice daily, may titrate off if his symptoms are well controlled.  Hospital visits: None in previous 6 months   Objective:  Lab Results  Component Value Date   CREATININE 1.10 07/01/2021   BUN 22 07/01/2021   GFR 68.68 07/01/2021   GFRNONAA >60 01/23/2020   GFRAA 72 06/23/2019   NA 137 07/01/2021   K 4.0 07/01/2021   CALCIUM 9.3 07/01/2021   CO2 27 07/01/2021   GLUCOSE 176 (H) 07/01/2021    Lab Results  Component Value Date/Time   HGBA1C 7.0 (H) 05/10/2021 09:50 AM   HGBA1C 6.5 (A) 01/31/2021 09:45 AM   HGBA1C 7.6 (H) 10/07/2020 10:18 AM   HGBA1C 6.4 (H) 11/27/2016 11:33 AM   HGBA1C 7.1 (H) 08/21/2016 10:25 AM   GFR 68.68 07/01/2021 08:02 AM   GFR 63.25 03/23/2021 02:59 PM   MICROALBUR <0.7 09/21/2017 01:06 PM   MICROALBUR <0.2 11/27/2016 11:33 AM    Last diabetic Eye exam:  Lab Results  Component Value Date/Time   HMDIABEYEEXA No Retinopathy 08/28/2020 12:00 AM    Last diabetic Foot exam: 09/2019 PCP  Lab Results  Component Value Date   CHOL 131 10/07/2020   HDL 43.90 10/07/2020   LDLCALC 55 10/07/2020   TRIG 161.0 (H) 10/07/2020   CHOLHDL 3 10/07/2020       Latest Ref Rng & Units 11/16/2020   12:30 PM 10/07/2020   10:18 AM 01/23/2020    9:36 PM  Hepatic Function  Total Protein 6.0 - 8.3 g/dL 6.6   6.5   7.2    Albumin 3.5 - 5.2 g/dL 4.2   4.3   4.3    AST 0 - 37 U/L '21   21   20    ' ALT 0 - 53 U/L '20   20   19    ' Alk Phosphatase 39 - 117 U/L 44   51   55    Total Bilirubin 0.2 - 1.2 mg/dL 0.6   0.6   0.9      Lab Results  Component Value Date/Time   TSH 2.030 04/15/2019 10:05 AM   TSH 1.93 10/27/2015 10:54 AM   FREET4 1.38 03/14/2013 11:12 AM   FREET4 1.11 05/10/2010 10:02 AM       Latest Ref Rng & Units 05/27/2021    3:15 PM 03/23/2021    2:59 PM 11/16/2020   12:30 PM  CBC  WBC 3.8 - 10.8 Thousand/uL 6.9   6.6   4.7    Hemoglobin 13.2 - 17.1 g/dL  15.4   14.6   14.6    Hematocrit 38.5 - 50.0 % 44.7   43.6   43.9    Platelets 140 - 400 Thousand/uL 196   194.0   118.0      Lab Results  Component Value Date/Time   VD25OH 32 10/27/2015 10:54 AM    Clinical ASCVD: Yes  The ASCVD Risk score (Arnett DK, et al., 2019) failed to calculate for the following reasons:   The patient has a prior MI or stroke diagnosis       04/08/2021    8:53 AM 02/02/2021   11:52 AM 12/30/2020   12:35 PM  Depression screen PHQ 2/9  Decreased Interest 0 0 2  Down, Depressed, Hopeless 0  0 0  PHQ - 2 Score 0 0 2  Altered sleeping 0 0 3  Tired, decreased energy 2 0 3  Change in appetite 0 0 0  Feeling bad or failure about yourself  0 0 0  Trouble concentrating 0 0 0  Moving slowly or fidgety/restless 0 0 0  Suicidal thoughts 0 0 0  PHQ-9 Score 2 0 8  Difficult doing work/chores Not difficult at all Not difficult at all Not difficult at all     Social History   Tobacco Use  Smoking Status Former   Packs/day: 1.00   Years: 42.00   Pack years: 42.00   Types: Cigarettes   Quit date: 12/30/2013   Years since quitting: 7.7  Smokeless Tobacco Never   BP Readings from Last 3 Encounters:  09/14/21 (!) 90/50  08/19/21 126/64  08/12/21 107/79   Pulse Readings from Last 3 Encounters:  09/14/21 70  08/19/21 85  08/12/21 79   Wt Readings from Last 3 Encounters:  09/14/21 214 lb (97.1 kg)  08/19/21 215 lb 6 oz (97.7 kg)  08/12/21 211 lb 9.6 oz (96 kg)   BMI Readings from Last 3 Encounters:  09/14/21 29.85 kg/m  08/19/21 30.04 kg/m  08/12/21 29.51 kg/m    Assessment/Interventions: Review of patient past medical history, allergies, medications, health status, including review of consultants reports, laboratory and other test data, was performed as part of comprehensive evaluation and provision of chronic care management services.   SDOH:  (Social Determinants of Health) assessments and interventions performed: No - done June 2022 per  AW[facility]   SDOH Screenings   Alcohol Screen: Low Risk    Last Alcohol Screening Score (AUDIT): 0  Depression (PHQ2-9): Low Risk    PHQ-2 Score: 2  Financial Resource Strain: Low Risk    Difficulty of Paying Living Expenses: Not very hard  Food Insecurity: No Food Insecurity   Worried About Charity fundraiser in the Last Year: Never true   Ran Out of Food in the Last Year: Never true  Housing: Low Risk    Last Housing Risk Score: 0  Physical Activity: Inactive   Days of Exercise per Week: 0 days   Minutes of Exercise per Session: 0 min  Social Connections: Not on file  Stress: No Stress Concern Present   Feeling of Stress : Not at all  Tobacco Use: Medium Risk   Smoking Tobacco Use: Former   Smokeless Tobacco Use: Never   Passive Exposure: Not on file  Transportation Needs: No Transportation Needs   Lack of Transportation (Medical): No   Lack of Transportation (Non-Medical): No    CCM Care Plan  Allergies  Allergen Reactions   Januvia [Sitagliptin] Other (See Comments)    Elevated lipase   Metformin And Related Diarrhea    Medications Reviewed Today     Reviewed by Maren Beach, Adair Laundry, CMA (Certified Medical Assistant) on 09/14/21 at 1109  Med List Status: <None>   Medication Order Taking? Sig Documenting Provider Last Dose Status Informant  albuterol (VENTOLIN HFA) 108 (90 Base) MCG/ACT inhaler [id number] Yes INHALE 2 PUFFS BY MOUTH EVERY 4 HOURS AS NEEDED FOR WHEEZE OR FOR SHORTNESS OF Nicholes Stairs, NP Taking Active   blood glucose meter kit and supplies KIT [id number] Yes Dispense based on patient and insurance preference. Use up to four times daily as directed. (FOR ICD-9 250.00, 250.01). [name], NP Taking Active   budesonide-formoterol [facility]) 160-4.5 MCG/ACT inhaler [id number] Yes INHALE  2 PUFFS INTO THE LUNGS TWICE A DAY [name], NP Taking Active   cetirizine (ZYRTEC ALLERGY) 10 MG tablet [id number] Yes Take 1 tablet (10 mg  total) by mouth at bedtime. Michela Pitcher, NP Taking Active   clopidogrel (PLAVIX) 75 MG tablet [id number] Yes TAKE 1 TABLET BY MOUTH EVERY DAY [name], MD Taking Active   diclofenac Sodium (VOLTAREN) 1 % GEL [id number] Yes Apply 2 g topically 4 (four) times daily. Delia Heady, PA-C Taking Active   DULoxetine (CYMBALTA) 30 MG capsule [id number] Yes Take 1 capsule (30 mg total) by mouth daily. For depression. Take with 60 mg dose. [name], NP Taking Active   DULoxetine (CYMBALTA) 60 MG capsule [id number] Yes TAKE 1 CAPSULE (60 MG TOTAL) BY MOUTH DAILY. FOR DEPRESSION. [name], NP Taking Active   empagliflozin (JARDIANCE) 25 MG TABS tablet [id number] Yes Take 1 tablet (25 mg total) by mouth daily before breakfast. For diabetes. [name], NP Taking Active   fish oil-omega-3 fatty acids 1000 MG capsule [id number] Yes Take 1 g by mouth daily.   [provider] Taking Active Self  fluticasone (FLONASE) 50 MCG/ACT nasal spray [id number] Yes PLACE 1 SPRAY INTO BOTH NOSTRILS DAILY AS NEEDED FOR ALLERGIES OR RHINITIS. [name], NP Taking Active   furosemide (LASIX) 20 MG tablet [id number] Yes TAKE 1 TABLET BY MOUTH EVERY DAY [name], MD Taking Active   gabapentin (NEURONTIN) 300 MG capsule [id number] Yes Take 1 capsule by mouth every morning and 2 capsules by mouth at night for neuropathy [name], NP Taking Active   glipiZIDE (GLIPIZIDE XL) 10 MG 24 hr tablet [id number] Yes Take 1 tablet (10 mg total) by mouth daily with breakfast. For diabetes. [name], NP Taking Active   glucose blood test strip [id number] Yes 1 each by Other route as needed for other. Use to check blood sugar up to three times a day-Accu-Chek meter [provider] Taking Active   isosorbide mononitrate (IMDUR) 60 MG 24 hr tablet [id number] Yes TAKE 1 TABLET BY MOUTH EVERY DAY [name], MD Taking Active   lisinopril (ZESTRIL) 2.5 MG tablet [id number]  Yes TAKE 1 TABLET BY MOUTH EVERY DAY [name], MD Taking Active   nitroGLYCERIN (NITROSTAT) 0.4 MG SL tablet [id number] Yes Place 1 tablet (0.4 mg total) under the tongue every 5 (five) minutes as needed for chest pain. [name], NP Taking Active   pantoprazole (PROTONIX) 40 MG tablet [id number] Yes TAKE 1 TABLET (40 MG TOTAL) BY MOUTH DAILY FOR HEARTBURN. [name], NP Taking Active   rosuvastatin (CRESTOR) 20 MG tablet [id number] Yes TAKE 1 TABLET BY MOUTH EVERY DAY IN THE EVENING FOR CHOLESTEROL [name], NP Taking Active   tamsulosin (FLOMAX) 0.4 MG CAPS capsule [id number] Yes TAKE 1 CAPSULE (0.4 MG TOTAL) BY MOUTH 2 (TWO) TIMES DAILY. Noreene Filbert, MD Taking Active   warfarin (COUMADIN) 5 MG tablet [id number] Yes TAKE 1/2 TABLET DAILY BY MOUTH EXCEPT TAKE 1 TABLET ON MONDAYS AND THURSDAYS OR AS DIRECTED BY ANTICOAGULATION CLINIC [name], NP Taking Active             Patient Active Problem List   Diagnosis Date Noted   Clammy skin 08/19/2021   Shortness of breath 08/19/2021   Neuropathy due to secondary diabetes ([place]) 07/28/2021   Elevated lipase 06/23/2021   Acute bronchitis with COPD ([place]) 03/07/2021   Acute cough 03/02/2021  Body aches 03/02/2021   Fever 03/02/2021   Chest wall pain 11/22/2020   Left lower quadrant abdominal pain 10/26/2020   History of diverticulitis 02/06/2020   Preventative health care 10/09/2019   Ear canal dryness, bilateral 08/12/2019   Polyp of colon    Decreased renal function 06/27/2018   Prostate cancer ([place]) 06/10/2018   Chest pain 06/04/2018   OSA (obstructive sleep apnea) 10/01/2017   Encounter for screening colonoscopy 10/01/2017   Viral upper respiratory tract infection 08/10/2017   Osteoarthritis 06/26/2017   Insomnia 10/01/2014   Diarrhea 04/13/2014   Depression 03/09/2014   Coronary artery disease 11/06/2013   Long term current use of anticoagulant therapy 06/19/2013   Pacemaker 04/30/2013    Acute myocardial infarction, subendocardial infarction, initial episode of care ([place]) 03/15/2013   Abnormal LFTs 05/02/2012   Chronic kidney disease, 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/26/2011   Chronic obstructive pulmonary disease ([place]) 04/20/2011   Atrial fibrillation    Hypertension    Hyperlipidemia    Type 2 diabetes mellitus with hyperglycemia ([place])    Atrial flutter ([place])    GASTROESOPHAGEAL REFLUX DISEASE 05/16/2010    Immunization History  Administered Date(s) Administered   Fluad Quad(high Dose 65+) 02/02/2021   Influenza, High Dose Seasonal PF 02/20/2018, 02/03/2019   Influenza,inj,Quad PF,6+ Mos 02/05/2014, 01/04/2015, 04/20/2016   Influenza-Unspecified 01/05/2011, 01/15/2013, 01/30/2020   PFIZER(Purple Top)SARS-COV-2 Vaccination 09/17/2019, 10/08/2019   Pneumococcal Conjugate-13 09/21/2017   Pneumococcal Polysaccharide-23 01/05/2011   Tdap 01/05/2011    Conditions to be addressed/monitored:  Hypertension, Hyperlipidemia, Diabetes, Atrial Fibrillation, COPD, and Depression  Care Plan : [place]  Updates made by Charlton Haws, [place] since 09/14/2021 12:00 AM     Problem: Hypertension, Hyperlipidemia, Diabetes, Atrial Fibrillation, COPD, and Depression   Priority: High  Onset Date: 12/07/2020     Long-Range Goal: Disease mgmt   Start Date: 06/10/2021  Expected End Date: 06/10/2022  This Visit's Progress: On track  Recent Progress: On track  Priority: High  Note:   Current Barriers:  None identified  Pharmacist Clinical Goal(s):  Patient will contact provider office for questions/concerns as evidenced notation of same in electronic health record through collaboration with PharmD and provider.   Interventions: 1:1 collaboration with [name], NP regarding development and update of comprehensive plan of care as evidenced by provider attestation and co-signature Inter-disciplinary care team collaboration (see longitudinal plan of  care) Comprehensive medication review performed; medication list updated in electronic medical record  Hypertension / Heart Failure (BP goal <130/80) -Controlled - BP at goal in recent OV -Hx OSA, unable to tolerate CPAP -LVEF 55-60% (11/25/20); mild Left ventricular hypertrophy -Current home BP readings: not checking currently -Current treatment: Lisinopril 2.5 mg daily -Appropriate, Effective, Safe, Accessible Furosemide 20 mg daily -Appropriate, Effective, Safe, Accessible Isosorbide Mononitrate 60 mg daily -Appropriate, Effective, Safe, Accessible -Medications previously tried: none reported  -Educated on BP goals and benefits of medications for prevention of heart attack, stroke and kidney damage; -Counseled to monitor BP at home a few days a week -Recommended to continue current medication   Atrial Fibrillation (Goal: prevent stroke and major bleeding) -Controlled - per patient report no symptoms, INR stable -Followed by cardiology, INR per [facility] -CHADSVASC: 5 -Current treatment: Warfarin 5 mg as directed -Appropriate, Effective, Safe, Accessible -Medications previously tried: metoprolol; no DOAC history due to cost concern -Counseled on importance of adherence to anticoagulant exactly as prescribed; avoidance of NSAIDs due to increased bleeding risk with anticoagulants; -Recommended to continue current medication  Hyperlipidemia/CAD: (LDL goal < 70) -Controlled - LDL 55 (09/2020) at goal -Hx CAD, stent Feb 2020, followed by cardiology -Pt denies any chest pain or abnormal symptoms. Affirms daily adherence.  -Current treatment: Rosuvastatin 20 mg daily - Appropriate, Effective, Safe, Accessible Clopidogrel 75 mg daily-Appropriate, Effective, Safe, Accessible Isosorbide Mononitrate 60 mg daily -Appropriate, Effective, Safe, Accessible Nitroglycerin - PRN  -Appropriate, Effective, Safe, Accessible Fish Oil 1000 mg daily -Appropriate, Effective, Safe, Accessible -Medications  previously tried: none reported  -Educated on Cholesterol goals;  -Recommended to continue current medication  Diabetes (A1c goal <7%) -Not ideally controlled - A1c 7.0 (04/2021) at goal; pt was recently taken off metformin and Januvia due to diarrhea and elevated lipase, respectively -Current home glucose readings - checks fasting 3 days per week fasting glucose: <150 -Current medications: Jardiance 25 mg daily - Appropriate, Effective, Safe, Accessible Glipizide XL 10 mg daily - Appropriate, Effective, Safe, Accessible -Medications previously tried: metformin (diarrhea), Januvia (elevated lipase) -Reviewed benefits of medications and fasting BG goal < 130 -Recommend to continue current medication  COPD/OSA (Goal: control symptoms and prevent exacerbations) -Controlled -  per patient report, no issues with breathing/wheezing -No active tobacco use. Quit 2013. Low dose CT 05/06/21 normal.  -No PFTs found. No CAT score documented. -Current treatment  Symbicort 160-4.5 mcg/act - 2 puff BID (takes PRN) - Appropriate, Effective, Safe, Accessible Albuterol PRN - Appropriate, Effective, Safe, Accessible -Medications previously tried: none reported -Exacerbations requiring treatment in last 6 months: none -Patient denies consistent use of maintenance inhaler. He uses Symbicort PRN -Frequency of rescue inhaler use: ~ 1-2 x/week -Counseled on Differences between maintenance and rescue inhalers -Recommended to continue current medication for now.  Depression/Insomnia (Goal: Improve mood, sleep) -Controlled - pt reports improvement in mood since duloxetine was increased -Caffeine: 2 cups coffee, drinks coke zero - 2 cans/day. No alcohol or tobacco use -PHQ9= 2 (03/2021) - no/minimal depression -Current treatment: Duloxetine 60 mg daily - Appropriate, Effective, Safe, Accessible Duloxetine 30 mg daily -Appropriate, Effective, Safe, Accessible -Medications previously tried/failed: Lexapro 10 mg,  Bupropion 150 mg BID, citalopram -Recommended to continue current medication  Health Maintenance -Vaccine gaps: Prevnar20 or PPSV23, TDAP -Hx prostate cancer, x/p radiation and Lupron early 2020  Patient Goals/Self-Care Activities Patient will:  - take medications as prescribed check glucose 3 days per week, document, and provide at future appointments check blood pressure 3 days per week, document, and provide at future appointments      Medication Assistance: None required.  Patient affirms current coverage meets needs. (Medicare/Medicaid)  Compliance/Adherence/Medication fill history: Care Gaps: None  Star-Rating Drugs: Rosuvastatin - PDC 100% Jardiance - PDC 100% Lisinopril -PDC 99%  Medication Access: Within the past 30 days, how often has patient missed a dose of medication? 0 Is a pillbox or other method used to improve adherence? Yes  Factors that may affect medication adherence? no barriers identified Are meds synced by current pharmacy? No  Are meds delivered by current pharmacy? No  Does patient experience delays in picking up medications due to transportation concerns? No   [facility] Services Reviewed: Is patient disadvantaged to use [facility]?: No  Current Rx insurance plan: U[employer] Dual Complete Name and location of Current pharmacy:  [id number] [place] [id number]: [phone]: [phone] [facility] services reviewed with patient today?: No  Patient requests to transfer care to [facility]?: No  Reason patient declined to change pharmacies: Hesitance/Possibly interested in future  Care Plan and Follow Up Patient Decision:  Patient agrees to Care Plan and Follow-up.  Follow Up Plan: Telephone follow up appointment with care management team member scheduled for: 6 months  Charlene Brooke, PharmD, BCACP Clinical Pharmacist [facility] [phone]

## 2021-09-14 ENCOUNTER — Encounter: Payer: Self-pay | Admitting: Cardiology

## 2021-09-14 ENCOUNTER — Ambulatory Visit (INDEPENDENT_AMBULATORY_CARE_PROVIDER_SITE_OTHER): Payer: Medicare Other | Admitting: Cardiology

## 2021-09-14 ENCOUNTER — Telehealth (HOSPITAL_COMMUNITY): Payer: Self-pay | Admitting: *Deleted

## 2021-09-14 ENCOUNTER — Ambulatory Visit (INDEPENDENT_AMBULATORY_CARE_PROVIDER_SITE_OTHER): Payer: Medicare Other

## 2021-09-14 ENCOUNTER — Encounter: Payer: Self-pay | Admitting: *Deleted

## 2021-09-14 DIAGNOSIS — R0602 Shortness of breath: Secondary | ICD-10-CM

## 2021-09-14 DIAGNOSIS — I4819 Other persistent atrial fibrillation: Secondary | ICD-10-CM | POA: Diagnosis not present

## 2021-09-14 DIAGNOSIS — Z7901 Long term (current) use of anticoagulants: Secondary | ICD-10-CM | POA: Diagnosis not present

## 2021-09-14 DIAGNOSIS — I25119 Atherosclerotic heart disease of native coronary artery with unspecified angina pectoris: Secondary | ICD-10-CM | POA: Diagnosis not present

## 2021-09-14 DIAGNOSIS — Z950 Presence of cardiac pacemaker: Secondary | ICD-10-CM | POA: Diagnosis not present

## 2021-09-14 LAB — POCT INR: INR: 1.8 — AB (ref 2.0–3.0)

## 2021-09-14 NOTE — Patient Instructions (Signed)
Visit Information  Phone number for Pharmacist: [phone]   Goals Addressed   None     Care Plan : [place]  Updates made by Charlton Haws, RPH since 09/14/2021 12:00 AM     Problem: Hypertension, Hyperlipidemia, Diabetes, COPD, and Depression   Priority: High  Onset Date: 12/07/2020     Long-Range Goal: Disease mgmt   Start Date: 06/10/2021  Expected End Date: 06/10/2022  This Visit's Progress: On track  Recent Progress: On track  Priority: High  Note:   Current Barriers:  None identified  Pharmacist Clinical Goal(s):  Patient will contact provider office for questions/concerns as evidenced notation of same in electronic health record through collaboration with PharmD and provider.   Interventions: 1:1 collaboration with [name], NP regarding development and update of comprehensive plan of care as evidenced by provider attestation and co-signature Inter-disciplinary care team collaboration (see longitudinal plan of care) Comprehensive medication review performed; medication list updated in electronic medical record  Hypertension / Heart Failure (BP goal <130/80) -Controlled - BP at goal in recent OV -Hx OSA, unable to tolerate CPAP -LVEF 55-60% (11/25/20); mild Left ventricular hypertrophy -Current home BP readings: not checking currently -Current treatment: Lisinopril 2.5 mg daily -Appropriate, Effective, Safe, Accessible Furosemide 20 mg daily -Appropriate, Effective, Safe, Accessible Isosorbide Mononitrate 60 mg daily -Appropriate, Effective, Safe, Accessible -Medications previously tried: none reported  -Educated on BP goals and benefits of medications for prevention of heart attack, stroke and kidney damage; -Counseled to monitor BP at home a few days a week -Recommended to continue current medication   Atrial Fibrillation (Goal: prevent stroke and major bleeding) -Controlled - per patient report no symptoms, INR stable -Followed  by cardiology, INR per [facility] -[facility]: 5 -Current treatment: Warfarin 5 mg as directed -Appropriate, Effective, Safe, Accessible -Medications previously tried: metoprolol; no DOAC history due to cost concern -Counseled on importance of adherence to anticoagulant exactly as prescribed; avoidance of NSAIDs due to increased bleeding risk with anticoagulants; -Recommended to continue current medication  Hyperlipidemia/CAD: (LDL goal < 70) -Controlled - LDL 55 (09/2020) at goal -Hx CAD, stent Feb 2020, followed by cardiology -Pt denies any chest pain or abnormal symptoms. Affirms daily adherence.  -Current treatment: Rosuvastatin 20 mg daily - Appropriate, Effective, Safe, Accessible Clopidogrel 75 mg daily-Appropriate, Effective, Safe, Accessible Isosorbide Mononitrate 60 mg daily -Appropriate, Effective, Safe, Accessible Nitroglycerin - PRN  -Appropriate, Effective, Safe, Accessible Fish Oil 1000 mg daily -Appropriate, Effective, Safe, Accessible -Medications previously tried: none reported  -Educated on Cholesterol goals;  -Recommended to continue current medication  Diabetes (A1c goal <7%) -Not ideally controlled - A1c 7.0 (04/2021) at goal; pt was recently taken off metformin and Januvia due to diarrhea and elevated lipase, respectively -Current home glucose readings - checks fasting 3 days per week fasting glucose: <150 -Current medications: Jardiance 25 mg daily - Appropriate, Effective, Safe, Accessible Glipizide XL 10 mg daily - Appropriate, Effective, Safe, Accessible -Medications previously tried: metformin (diarrhea), Januvia (elevated lipase) -Reviewed benefits of medications and fasting BG goal < 130 -Recommend to continue current medication  COPD/OSA (Goal: control symptoms and prevent exacerbations) -Controlled -  per patient report, no issues with breathing/wheezing -No active tobacco use. Quit 2013. Low dose CT 05/06/21 normal.  -No PFTs found. No CAT score  documented. -Current treatment  Symbicort 160-4.5 mcg/act - 2 puff BID (takes PRN) - Appropriate, Effective, Safe, Accessible Albuterol PRN - Appropriate, Effective, Safe, Accessible -Medications previously tried: none reported -Exacerbations requiring treatment in last  6 months: none -Patient denies consistent use of maintenance inhaler. He uses Symbicort PRN -Frequency of rescue inhaler use: ~ 1-2 x/week -Counseled on Differences between maintenance and rescue inhalers -Recommended to continue current medication for now.  Depression/Insomnia (Goal: Improve mood, sleep) -Controlled - pt reports improvement in mood since duloxetine was increased -Caffeine: 2 cups coffee, drinks coke zero - 2 cans/day. No alcohol or tobacco use -PHQ9= 2 (03/2021) - no/minimal depression -Current treatment: Duloxetine 60 mg daily - Appropriate, Effective, Safe, Accessible Duloxetine 30 mg daily -Appropriate, Effective, Safe, Accessible -Medications previously tried/failed: Lexapro 10 mg, Bupropion 150 mg BID, citalopram -Recommended to continue current medication  Health Maintenance -Vaccine gaps: Prevnar20 or PPSV23, TDAP -Hx prostate cancer, x/p radiation and Lupron early 2020  Patient Goals/Self-Care Activities Patient will:  - take medications as prescribed check glucose 3 days per week, document, and provide at future appointments check blood pressure 3 days per week, document, and provide at future appointments       The patient verbalized understanding of instructions, educational materials, and care plan provided today and DECLINED offer to receive copy of patient instructions, educational materials, and care plan.  Telephone follow up appointment with pharmacy team member scheduled for: 6 months  Charlene Brooke, PharmD, [facility] Clinical Pharmacist [facility] [phone]

## 2021-09-14 NOTE — Progress Notes (Addendum)
Pt was prescribed an abx and warfarin dosing was decreased during abx course. Pt also reported SOB for about 1 week and has talked to cardiology and has an apt with cardiology at 1120 today.  Increase dose today to take 5 mg and then continue 2.'5mg'$  daily except take '5mg'$  on Mondays and Thursdays. Recheck in 3 week.

## 2021-09-14 NOTE — Assessment & Plan Note (Signed)
Checking stress test.  Echocardiogram from last year reviewed, reassuring pump function.

## 2021-09-14 NOTE — Patient Instructions (Addendum)
Pre visit review using our clinic review tool, if applicable. No additional management support is needed unless otherwise documented below in the visit note.  Increase dose today to take 5 mg and then continue 2.'5mg'$  daily except take '5mg'$  on Mondays and Thursdays. Recheck in 3 week.

## 2021-09-14 NOTE — Assessment & Plan Note (Signed)
Cardiac catheterization reviewed from 2020.  His symptoms currently of fatigue, shortness of breath with activity are similar to what he was feeling back and then.  That catheterization resulted in continued medical management, transition to Plavix.  I would like to proceed with pharmacologic stress test.  If high risk, proceed with heart catheterization.  If not, continue with medical management, conditioning efforts.

## 2021-09-14 NOTE — Patient Instructions (Signed)
Medication Instructions:  The current medical regimen is effective;  continue present plan and medications.  *If you need a refill on your cardiac medications before your next appointment, please call your pharmacy*   Testing/Procedures: Your physician has requested that you have a lexiscan myoview. For further information please visit [email or web address]. Please follow instruction sheet, as given.   Follow-Up: At [facility], you and your health needs are our priority.  As part of our continuing mission to provide you with exceptional heart care, we have created designated Provider Care Teams.  These Care Teams include your primary Cardiologist (physician) and Advanced Practice Providers (APPs -  Physician Assistants and Nurse Practitioners) who all work together to provide you with the care you need, when you need it.  We recommend signing up for the patient portal called "MyChart".  Sign up information is provided on this After Visit Summary.  MyChart is used to connect with patients for Virtual Visits (Telemedicine).  Patients are able to view lab/test results, encounter notes, upcoming appointments, etc.  Non-urgent messages can be sent to your provider as well.   To learn more about what you can do with MyChart, go to [email or web address].    Your next appointment:   As scheduled.  Important Information About Sugar

## 2021-09-14 NOTE — Assessment & Plan Note (Signed)
Overall well rate controlled.  Previously failed rhythm control.

## 2021-09-14 NOTE — Assessment & Plan Note (Signed)
Pacemaker appeared normal.  He was experiencing some shocklike symptom that lasted a few seconds when he stood up surrounding his pacemaker.  This is likely neuropathic.  There is no indication that his pacemaker is malfunctioning.  Reassurance has been given.

## 2021-09-14 NOTE — Telephone Encounter (Signed)
Left message on voicemail per DPR in reference to upcoming appointment scheduled on 09/19/2021 at 10:15 with detailed instructions given per Myocardial Perfusion Study Information Sheet for the test. LM to arrive 15 minutes early, and that it is imperative to arrive on time for appointment to keep from having the test rescheduled. If you need to cancel or reschedule your appointment, please call the office within 24 hours of your appointment. Failure to do so may result in a cancellation of your appointment, and a $50 no show fee. Phone number given for call back for any questions.

## 2021-09-14 NOTE — Progress Notes (Signed)
I have reviewed and agree with note, evaluation, plan.   Elyce Zollinger, MD  

## 2021-09-14 NOTE — Progress Notes (Signed)
Cardiology Office Note:    Date:  09/14/2021   ID:  [name] Cook, DOB 04/20/1950, MRN [id number]  PCP:  [name], NP   [facility] Providers Cardiologist:  Candee Furbish, MD Electrophysiologist:  [name], MD     Referring MD: [name], NP   History of Present Illness:    [name] Cook is a 71 y.o. male here for the follow-up of coronary artery disease, atrial fibrillation, and hypertension. He is a patient of [name].  He called the office 09/13/2021 and reported worsening shortness of breath since 09/08/21 while moving around. He also complained of associated LE weakness and dizziness. He had not noticed any weight gain at home and denied peripheral edema. He was scheduled for a DOD visit today.  He last saw [name] on 06/02/2021 and was noted to have chronic atrial fib and CHB s/p PPM insertion. His VR was well controlled. He was asked to stop Toprol pending an injection.  Today: He also complains of a shocking sensation around the area of his pacemaker implant. This mostly occurs when he stands up, with a couple of shocks in quick succession (few seconds). This has been ongoing for about a week. He denies any known physical injury.  Lately he has been feeling more winded as well. It is difficult for him to "get up and go", feeling no energy. Walking outside from his home will cause him to be short of breath. Of note, he previously felt this way at the time of his heart catheterization in 2020.  He denies any palpitations, or peripheral edema. No lightheadedness, headaches, syncope, orthopnea, or PND.    Past Medical History:  Diagnosis Date   Arthritis    "knees and lower back" (03/14/2013)   Atrial flutter ([place])    radiofrequency ablation in 2001   CAD (coronary artery disease)    a. Nonobstructive. Cardiac cath in 2001-50% mid RI, normal LM, LAD, RCA b. cath 10/16/2014 95% mid RCA treated with DES, 99% ost D1 medical management due to small aneurysmal  segment   Chronic anticoagulation    chronic Coumadin anticoagulation   Chronic obstructive pulmonary disease ([place]) 04/20/2011   Diabetes mellitus, type 2 (HCC)    GERD (gastroesophageal reflux disease)    Hyperlipidemia    Hypertension    with hypertensive heart disease   Left knee pain 10/25/2017   medial   Obesity    Persistent atrial fibrillation ([place])    recurrent atrial flutter since 2001 s/p DCCVs, multiple failed AADs, h/o tachy-mediated cardiomyopathy   Shortness of breath    "can come on at any time" (03/14/2013)   Sleep apnea    "dx'd; couldn't wear the mask" (03/14/2013)   Tobacco abuse     Past Surgical History:  Procedure Laterality Date   ATRIAL FLUTTER ABLATION  2002   atrial flutter; subsequently developed atrial fibrillation   AV NODE ABLATION  01/24/2013   CARDIAC CATHETERIZATION  2002   CARDIAC CATHETERIZATION N/A 10/16/2014   Procedure: Left Heart Cath and Coronary Angiography;  Surgeon: [name], MD;  Location: [place] [facility];  Service: Cardiovascular;  Laterality: N/A;   CARDIOVERSION  05/31/2011   Procedure: CARDIOVERSION;  Surgeon: Cristopher Estimable. Lattie Haw, MD;  Location: [facility];  Service: Cardiovascular;  Laterality: N/A;   CARPAL TUNNEL RELEASE Left 1980's   COLONOSCOPY WITH PROPOFOL N/A 12/30/2018   Procedure: COLONOSCOPY WITH PROPOFOL;  Surgeon: Jonathon Bellows, MD;  Location: [facility] END[facility];  Service: Gastroenterology;  Laterality: N/A;   COLONOSCOPY WITH PROPOFOL N/A 12/04/2019   Procedure: COLONOSCOPY WITH PROPOFOL;  Surgeon: Jonathon Bellows, MD;  Location: [facility] ENDOSCOPY;  Service: Gastroenterology;  Laterality: N/A;   COLONOSCOPY WITH PROPOFOL N/A 06/13/2021   Procedure: COLONOSCOPY WITH PROPOFOL;  Surgeon: Jonathon Bellows, MD;  Location: [facility]COPY;  Service: Gastroenterology;  Laterality: N/A;   INSERT / REPLACE / REMOVE PACEMAKER  01/24/2013    Medtronic Adapta L dual-chamber pacemaker, serial number [id number]    LEFT HEART CATH AND CORONARY  ANGIOGRAPHY N/A 06/07/2018   Procedure: LEFT HEART CATH AND CORONARY ANGIOGRAPHY;  Surgeon: [name], MD;  Location: [place] [facility];  Service: Cardiovascular;  Laterality: N/A;   LEFT HEART CATHETERIZATION WITH CORONARY ANGIOGRAM N/A 03/17/2013   Procedure: LEFT HEART CATHETERIZATION WITH CORONARY ANGIOGRAM;  Surgeon: Burnell Blanks, MD; LAD mild dz, D1 branch 100%, inferior branch 99%, CFX OK, RCA 50%, EF 65%     LOOP RECORDER IMPLANT  2002   PERMANENT PACEMAKER INSERTION N/A 01/24/2013   Procedure: PERMANENT PACEMAKER INSERTION;  Surgeon: [name], MD;  Location: [facility] CATH LAB;  Service: Cardiovascular;  Laterality: N/A;   TIBIAL TUBERCLERPLASTY  ~ 2003    Current Medications: Current Meds  Medication Sig   albuterol (VENTOLIN HFA) 108 (90 Base) MCG/ACT inhaler INHALE 2 PUFFS BY MOUTH EVERY 4 HOURS AS NEEDED FOR WHEEZE OR FOR SHORTNESS OF BREATH   blood glucose meter kit and supplies KIT Dispense based on patient and insurance preference. Use up to four times daily as directed. (FOR ICD-9 250.00, 250.01).   budesonide-formoterol (SYMBICORT) 160-4.5 MCG/ACT inhaler INHALE 2 PUFFS INTO THE LUNGS TWICE A DAY   cetirizine (ZYRTEC ALLERGY) 10 MG tablet Take 1 tablet (10 mg total) by mouth at bedtime.   clopidogrel (PLAVIX) 75 MG tablet TAKE 1 TABLET BY MOUTH EVERY DAY   diclofenac Sodium (VOLTAREN) 1 % GEL Apply 2 g topically 4 (four) times daily.   DULoxetine (CYMBALTA) 30 MG capsule Take 1 capsule (30 mg total) by mouth daily. For depression. Take with 60 mg dose.   DULoxetine (CYMBALTA) 60 MG capsule TAKE 1 CAPSULE (60 MG TOTAL) BY MOUTH DAILY. FOR DEPRESSION.   empagliflozin (JARDIANCE) 25 MG TABS tablet Take 1 tablet (25 mg total) by mouth daily before breakfast. For diabetes.   fish oil-omega-3 fatty acids 1000 MG capsule Take 1 g by mouth daily.     fluticasone (FLONASE) 50 MCG/ACT nasal spray PLACE 1 SPRAY INTO BOTH NOSTRILS DAILY AS NEEDED FOR ALLERGIES OR RHINITIS.    furosemide (LASIX) 20 MG tablet TAKE 1 TABLET BY MOUTH EVERY DAY   gabapentin (NEURONTIN) 300 MG capsule Take 1 capsule by mouth every morning and 2 capsules by mouth at night for neuropathy   glipiZIDE (GLIPIZIDE XL) 10 MG 24 hr tablet Take 1 tablet (10 mg total) by mouth daily with breakfast. For diabetes.   glucose blood test strip 1 each by Other route as needed for other. Use to check blood sugar up to three times a day-Accu-Chek meter   isosorbide mononitrate (IMDUR) 60 MG 24 hr tablet TAKE 1 TABLET BY MOUTH EVERY DAY   lisinopril (ZESTRIL) 2.5 MG tablet TAKE 1 TABLET BY MOUTH EVERY DAY   nitroGLYCERIN (NITROSTAT) 0.4 MG SL tablet Place 1 tablet (0.4 mg total) under the tongue every 5 (five) minutes as needed for chest pain.   pantoprazole (PROTONIX) 40 MG tablet TAKE 1 TABLET (40 MG TOTAL) BY MOUTH DAILY FOR HEARTBURN.   rosuvastatin (CRESTOR)  20 MG tablet TAKE 1 TABLET BY MOUTH EVERY DAY IN THE EVENING FOR CHOLESTEROL   tamsulosin (FLOMAX) 0.4 MG CAPS capsule TAKE 1 CAPSULE (0.4 MG TOTAL) BY MOUTH 2 (TWO) TIMES DAILY.   warfarin (COUMADIN) 5 MG tablet TAKE 1/2 TABLET DAILY BY MOUTH EXCEPT TAKE 1 TABLET ON MONDAYS AND THURSDAYS OR AS DIRECTED BY ANTICOAGULATION CLINIC     Allergies:   Januvia [sitagliptin] and Metformin and related   Social History   Socioeconomic History   Marital status: Widowed    Spouse name: Not on file   Number of children: 1   Years of education: Not on file   Highest education level: Not on file  Occupational History   Occupation: Unemployed    Employer: UNEMPLOYED  Tobacco Use   Smoking status: Former    Packs/day: 1.00    Years: 42.00    Pack years: 42.00    Types: Cigarettes    Quit date: 12/30/2013    Years since quitting: 7.7   Smokeless tobacco: Never  Vaping Use   Vaping Use: Never used  Substance and Sexual Activity   Alcohol use: Not Currently    Comment: 03/14/2013 "stopped drinking back in 2002; never had problem w/it"   Drug use:  No   Sexual activity: Not Currently  Other Topics Concern   Not on file  Social History Narrative   Single.   Retired.    1 son, deceased.    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   Enjoys playing on the computer.       Social Determinants of Health   Financial Resource Strain: Low Risk    Difficulty of Paying Living Expenses: Not very hard  Food Insecurity: No Food Insecurity   Worried About Charity fundraiser in the Last Year: Never true   Ran Out of Food in the Last Year: Never true  Transportation Needs: No Transportation Needs   Lack of Transportation (Medical): No   Lack of Transportation (Non-Medical): No  Physical Activity: Inactive   Days of Exercise per Week: 0 days   Minutes of Exercise per Session: 0 min  Stress: No Stress Concern Present   Feeling of Stress : Not at all  Social Connections: Not on file     Family History: The patient's family history includes Alzheimer's disease in his mother; Osteoporosis in his mother.  ROS:   Please see the history of present illness.    (+) Shocking sensation to area of PPM implant (+) Shortness of breath (+) Fatigue All other systems reviewed and are negative.  EKGs/Labs/Other Studies Reviewed:    The following studies were reviewed today:  CT Chest  05/06/2021: FINDINGS: Cardiovascular: Heart size is normal. There is no significant pericardial fluid, thickening or pericardial calcification. There is aortic atherosclerosis, as well as atherosclerosis of the great vessels of the mediastinum and the coronary arteries, including calcified atherosclerotic plaque in the left main, left anterior descending, left circumflex and right coronary arteries. Left-sided pacemaker device in place with lead tips terminating in the right atrium and right ventricular apex.   Mediastinum/Nodes: No pathologically enlarged mediastinal or hilar lymph nodes. Please note that accurate  exclusion of hilar adenopathy is limited on noncontrast CT scans. Esophagus is unremarkable in appearance. No axillary lymphadenopathy.   Lungs/Pleura: Small pulmonary nodules are noted in the lungs, largest of which is in the anterior aspect of the right upper lobe (axial image 159 of series 3), with a  volume derived mean diameter of 4.8 mm. No other larger more suspicious appearing pulmonary nodules or masses are noted. No acute consolidative airspace disease. No pleural effusions. Mild diffuse bronchial wall thickening with mild centrilobular and paraseptal emphysema.   Upper Abdomen: Aortic atherosclerosis. Diffuse low attenuation throughout the visualized hepatic parenchyma, indicative of a background of hepatic steatosis.   Musculoskeletal: There are no aggressive appearing lytic or blastic lesions noted in the visualized portions of the skeleton.   IMPRESSION: 1. Lung-RADS 2S, benign appearance or behavior. Continue annual screening with low-dose chest CT without contrast in 12 months. 2. The "S" modifier above refers to potentially clinically significant non lung cancer related findings. Specifically, there is aortic atherosclerosis, in addition to left main and three-vessel coronary artery disease. Please note that although the presence of coronary artery calcium documents the presence of coronary artery disease, the severity of this disease and any potential stenosis cannot be assessed on this non-gated CT examination. Assessment for potential risk factor modification, dietary therapy or pharmacologic therapy may be warranted, if clinically indicated. 3. Mild diffuse bronchial wall thickening with mild centrilobular and paraseptal emphysema; imaging findings suggestive of underlying COPD. 4. Hepatic steatosis.   Aortic Atherosclerosis (ICD10-I70.0) and Emphysema (ICD10-J43.9).  Echo  11/25/2020: Sonographer Comments: Technically difficult study due to poor echo windows  and suboptimal apical window. Image acquisition challenging due to patient body habitus.  IMPRESSIONS    1. Left ventricular ejection fraction, by estimation, is 55 to 60%. The  left ventricle has normal function. The left ventricle has no regional  wall motion abnormalities. There is mild left ventricular hypertrophy.  Left ventricular diastolic parameters  are indeterminate.   2. Right ventricular systolic function is normal. The right ventricular  size is normal.   3. Left atrial size was mildly dilated.   4. The mitral valve is normal in structure. No evidence of mitral valve  regurgitation. No evidence of mitral stenosis.   5. The aortic valve is tricuspid. Aortic valve regurgitation is not  visualized. No aortic stenosis is present.   6. Aortic dilatation noted. There is mild dilatation of the aortic root,  measuring 43 mm. There is mild dilatation of the ascending aorta,  measuring 39 mm.   7. The inferior vena cava is normal in size with greater than 50%  respiratory variability, suggesting right atrial pressure of 3 mmHg.   Comparison(s): 06/06/18 EF 45-50%.   Left Heart Cath  06/07/2018: Lat 1st Diag lesion is 100% stenosed. Ost 1st Diag to 1st Diag lesion is 99% stenosed. Previously placed Mid RCA-1 drug eluting stent is widely patent. Balloon angioplasty was performed. Mid RCA-2 lesion is 30% stenosed. Prox RCA lesion is 40% stenosed.   1. Patent mid-RCA stent with no evidence of restenosis 2. Nonobstructive RCA stenosis 3. Patent left main, LAD, and LCx without significant stenosis 4. Severe complex stenosis of the first diagonal branch of the LAD, unchanged from previous cath studies 5. Normal LVEDP   Recommend: medical therapy. Stable coronary anatomy. Resume warfarin today. Probably best to continue clopidogrel considering extent of his CAD and lack of bleeding problems. Will DC ASA.   Diagnostic: Dominance: Right    EKG:  EKG is personally reviewed and  interpreted. 09/14/2021: EKG was not ordered.   Recent Labs: 11/16/2020: ALT 20 05/27/2021: Hemoglobin 15.4; Platelets 196 07/01/2021: BUN 22; Creatinine, Ser 1.10; Potassium 4.0; Sodium 137   Recent Lipid Panel    Component Value Date/Time   CHOL 131 10/07/2020 1018  CHOL 147 05/28/2019 1202   CHOL 125 01/23/2013 0151   TRIG 161.0 (H) 10/07/2020 1018   TRIG 208 (H) 01/23/2013 0151   TRIG 289 05/05/2008 0000   HDL 43.90 10/07/2020 1018   HDL 54 05/28/2019 1202   HDL 33 (L) 01/23/2013 0151   CHOLHDL 3 10/07/2020 1018   VLDL 32.2 10/07/2020 1018   VLDL 42 (H) 01/23/2013 0151   LDLCALC 55 10/07/2020 1018   LDLCALC 69 05/28/2019 1202   LDLCALC 56 03/28/2017 1433   LDLCALC 50 01/23/2013 0151   LDLCALC 119 05/05/2008 0000     Risk Assessment/Calculations:          Physical Exam:    VS:  BP (!) 90/50 (BP Location: Left Arm, Patient Position: Sitting, Cuff Size: Normal)   Pulse 70   Ht '5\' 11"'  (1.803 m)   Wt 214 lb (97.1 kg)   SpO2 95%   BMI 29.85 kg/m     Wt Readings from Last 3 Encounters:  09/14/21 214 lb (97.1 kg)  08/19/21 215 lb 6 oz (97.7 kg)  08/12/21 211 lb 9.6 oz (96 kg)     GEN: Well nourished, well developed in no acute distress HEENT: Normal NECK: No JVD; No carotid bruits LYMPHATICS: No lymphadenopathy CARDIAC: RRR, no murmurs, rubs, gallops; Device pocket well healed. RESPIRATORY:  Clear to auscultation without rales, wheezing or rhonchi  ABDOMEN: Soft, non-tender, non-distended MUSCULOSKELETAL:  No edema; No deformity  SKIN: Warm and dry NEUROLOGIC:  Alert and oriented x 3 PSYCHIATRIC:  Normal affect   ASSESSMENT:    1. Coronary artery disease involving native coronary artery of native heart with angina pectoris ([place])   2. Atrial fibrillation   3. Pacemaker   4. Shortness of breath    PLAN:    In order of problems listed above:  Coronary artery disease Cardiac catheterization reviewed from 2020.  His symptoms currently of fatigue,  shortness of breath with activity are similar to what he was feeling back and then.  That catheterization resulted in continued medical management, transition to Plavix.  I would like to proceed with pharmacologic stress test.  If high risk, proceed with heart catheterization.  If not, continue with medical management, conditioning efforts.  Atrial fibrillation Overall well rate controlled.  Previously failed rhythm control.  Pacemaker Pacemaker appeared normal.  He was experiencing some shocklike symptom that lasted a few seconds when he stood up surrounding his pacemaker.  This is likely neuropathic.  There is no indication that his pacemaker is malfunctioning.  Reassurance has been given.  Shortness of breath Checking stress test.  Echocardiogram from last year reviewed, reassuring pump function.    Shared Decision Making/Informed Consent. The risks [chest pain, shortness of breath, cardiac arrhythmias, dizziness, blood pressure fluctuations, myocardial infarction, stroke/transient ischemic attack, nausea, vomiting, allergic reaction, radiation exposure, metallic taste sensation and life-threatening complications (estimated to be 1 in 10,000)], benefits (risk stratification, diagnosing coronary artery disease, treatment guidance) and alternatives of a nuclear stress test were discussed in detail with Mr. Okelley and he agrees to proceed.   Follow-up:  TBD based on results of testing.  Medication Adjustments/Labs and Tests Ordered: Current medicines are reviewed at length with the patient today.  Concerns regarding medicines are outlined above.   No orders of the defined types were placed in this encounter.  No orders of the defined types were placed in this encounter.  There are no Patient Instructions on file for this visit.   I,Mathew Stumpf,acting as a Education administrator  for Candee Furbish, MD.,have documented all relevant documentation on the behalf of Candee Furbish, MD,as directed by  Candee Furbish,  MD while in the presence of Candee Furbish, MD.  I, Candee Furbish, MD, have reviewed all documentation for this visit. The documentation on 09/14/21 for the exam, diagnosis, procedures, and orders are all accurate and complete.   Signed, Candee Furbish, MD  09/14/2021 12:05 PM    [place]

## 2021-09-15 ENCOUNTER — Ambulatory Visit: Payer: Medicare Other

## 2021-09-19 ENCOUNTER — Ambulatory Visit (HOSPITAL_COMMUNITY): Payer: Medicare Other | Attending: Cardiology

## 2021-09-19 DIAGNOSIS — I25119 Atherosclerotic heart disease of native coronary artery with unspecified angina pectoris: Secondary | ICD-10-CM

## 2021-09-19 DIAGNOSIS — R0602 Shortness of breath: Secondary | ICD-10-CM

## 2021-09-19 LAB — MYOCARDIAL PERFUSION IMAGING
LV dias vol: 78 mL (ref 62–150)
LV sys vol: 29 mL
Nuc Stress EF: 63 %
Peak HR: 81 {beats}/min
Rest HR: 80 {beats}/min
Rest Nuclear Isotope Dose: 10.1 mCi
SDS: 1
SRS: 2
SSS: 3
ST Depression (mm): 0 mm
Stress Nuclear Isotope Dose: 30.7 mCi
TID: 1.15

## 2021-09-19 MED ORDER — TECHNETIUM TC 99M TETROFOSMIN IV KIT
30.7000 | PACK | Freq: Once | INTRAVENOUS | Status: AC | PRN
  Administered 2021-09-19: 30.7 via INTRAVENOUS

## 2021-09-19 MED ORDER — REGADENOSON 0.4 MG/5ML IV SOLN
0.4000 mg | Freq: Once | INTRAVENOUS | Status: AC
  Administered 2021-09-19: 0.4 mg via INTRAVENOUS

## 2021-09-19 MED ORDER — TECHNETIUM TC 99M TETROFOSMIN IV KIT
10.1000 | PACK | Freq: Once | INTRAVENOUS | Status: AC | PRN
  Administered 2021-09-19: 10.1 via INTRAVENOUS

## 2021-09-23 ENCOUNTER — Ambulatory Visit (INDEPENDENT_AMBULATORY_CARE_PROVIDER_SITE_OTHER): Payer: Medicare Other

## 2021-09-23 DIAGNOSIS — I442 Atrioventricular block, complete: Secondary | ICD-10-CM | POA: Diagnosis not present

## 2021-09-25 ENCOUNTER — Other Ambulatory Visit: Payer: Self-pay | Admitting: Internal Medicine

## 2021-09-25 ENCOUNTER — Other Ambulatory Visit: Payer: Self-pay | Admitting: Primary Care

## 2021-09-25 DIAGNOSIS — E119 Type 2 diabetes mellitus without complications: Secondary | ICD-10-CM

## 2021-09-26 ENCOUNTER — Other Ambulatory Visit: Payer: Self-pay | Admitting: Nurse Practitioner

## 2021-09-26 DIAGNOSIS — J069 Acute upper respiratory infection, unspecified: Secondary | ICD-10-CM

## 2021-09-26 LAB — CUP PACEART REMOTE DEVICE CHECK
Battery Impedance: 1135 Ohm
Battery Remaining Longevity: 61 mo
Battery Voltage: 2.77 V
Brady Statistic RV Percent Paced: 100 %
Date Time Interrogation Session: 20230612144542
Implantable Lead Implant Date: 20141010
Implantable Lead Implant Date: 20141010
Implantable Lead Location: 753859
Implantable Lead Location: 753860
Implantable Lead Model: 5076
Implantable Lead Model: 5076
Implantable Pulse Generator Implant Date: 20141010
Lead Channel Impedance Value: 627 Ohm
Lead Channel Impedance Value: 67 Ohm
Lead Channel Pacing Threshold Amplitude: 0.625 V
Lead Channel Pacing Threshold Pulse Width: 0.4 ms
Lead Channel Setting Pacing Amplitude: 2.5 V
Lead Channel Setting Pacing Pulse Width: 0.4 ms
Lead Channel Setting Sensing Sensitivity: 4 mV

## 2021-09-29 NOTE — Progress Notes (Signed)
Remote pacemaker transmission.   

## 2021-10-06 ENCOUNTER — Ambulatory Visit (INDEPENDENT_AMBULATORY_CARE_PROVIDER_SITE_OTHER): Payer: Medicare Other

## 2021-10-06 DIAGNOSIS — Z7901 Long term (current) use of anticoagulants: Secondary | ICD-10-CM

## 2021-10-06 DIAGNOSIS — I4819 Other persistent atrial fibrillation: Secondary | ICD-10-CM

## 2021-10-06 LAB — POCT INR: INR: 2.4 (ref 2.0–3.0)

## 2021-10-06 NOTE — Patient Instructions (Addendum)
Pre visit review using our clinic review tool, if applicable. No additional management support is needed unless otherwise documented below in the visit note.  Continue 2.5mg daily except take 5mg on Mondays and Thursdays. Recheck in 6 week.  

## 2021-10-07 ENCOUNTER — Ambulatory Visit (INDEPENDENT_AMBULATORY_CARE_PROVIDER_SITE_OTHER): Payer: Medicare Other

## 2021-10-07 VITALS — Wt 214.0 lb

## 2021-10-07 DIAGNOSIS — Z0000 Encounter for general adult medical examination without abnormal findings: Secondary | ICD-10-CM

## 2021-10-07 NOTE — Progress Notes (Cosign Needed)
Virtual Visit via Telephone Note  I connected with  [name] Cook on 10/07/21 at 12:00 PM EDT by telephone and verified that I am speaking with the correct person using two identifiers.  Location: Patient: home Provider: [facility] Persons participating in the virtual visit: patient/Nurse Health Advisor   I discussed the limitations, risks, security and privacy concerns of performing an evaluation and management service by telephone and the availability of in person appointments. The patient expressed understanding and agreed to proceed.  Interactive audio and video telecommunications were attempted between this nurse and patient, however failed, due to patient having technical difficulties OR patient did not have access to video capability.  We continued and completed visit with audio only.  Some vital signs may be absent or patient reported.   Hal Hope, LPN  Subjective:   [name] Cook is a 71 y.o. male who presents for Medicare Annual/Subsequent preventive examination.  Review of Systems     Cardiac Risk Factors include: advanced age (>11men, >82 women);diabetes mellitus;male gender;hypertension     Objective:    There were no vitals filed for this visit. There is no height or weight on file to calculate BMI.     10/07/2021   11:56 AM 06/13/2021   10:27 AM 01/13/2021   10:21 AM 10/06/2020   12:15 PM 01/15/2020   10:45 AM 12/04/2019    7:43 AM 10/06/2019   12:01 PM  Advanced Directives  Does Patient Have a Medical Advance Directive? No No No No No No No  Would patient like information on creating a medical advance directive? No - Patient declined  No - Patient declined No - Patient declined No - Patient declined  No - Patient declined    Current Medications (verified) Outpatient Encounter Medications as of 10/07/2021  Medication Sig   albuterol (VENTOLIN HFA) 108 (90 Base) MCG/ACT inhaler INHALE 2 PUFFS BY MOUTH EVERY 4 HOURS AS NEEDED FOR WHEEZE OR FOR SHORTNESS OF  BREATH   blood glucose meter kit and supplies KIT Dispense based on patient and insurance preference. Use up to four times daily as directed. (FOR ICD-9 250.00, 250.01).   budesonide-formoterol (SYMBICORT) 160-4.5 MCG/ACT inhaler INHALE 2 PUFFS INTO THE LUNGS TWICE A DAY   cetirizine (ZYRTEC) 10 MG tablet TAKE 1 TABLET BY MOUTH EVERYDAY AT BEDTIME   clopidogrel (PLAVIX) 75 MG tablet TAKE 1 TABLET BY MOUTH EVERY DAY   diclofenac Sodium (VOLTAREN) 1 % GEL Apply 2 g topically 4 (four) times daily.   DULoxetine (CYMBALTA) 30 MG capsule Take 1 capsule (30 mg total) by mouth daily. For depression. Take with 60 mg dose.   DULoxetine (CYMBALTA) 60 MG capsule TAKE 1 CAPSULE (60 MG TOTAL) BY MOUTH DAILY. FOR DEPRESSION.   empagliflozin (JARDIANCE) 25 MG TABS tablet Take 1 tablet (25 mg total) by mouth daily before breakfast. For diabetes.   fish oil-omega-3 fatty acids 1000 MG capsule Take 1 g by mouth daily.     fluticasone (FLONASE) 50 MCG/ACT nasal spray PLACE 1 SPRAY INTO BOTH NOSTRILS DAILY AS NEEDED FOR ALLERGIES OR RHINITIS.   furosemide (LASIX) 20 MG tablet TAKE 1 TABLET BY MOUTH EVERY DAY   gabapentin (NEURONTIN) 300 MG capsule Take 1 capsule by mouth every morning and 2 capsules by mouth at night for neuropathy   glipiZIDE (GLIPIZIDE XL) 10 MG 24 hr tablet Take 1 tablet (10 mg total) by mouth daily with breakfast. For diabetes.   glucose blood test strip 1 each by Other route  as needed for other. Use to check blood sugar up to three times a day-Accu-Chek meter   isosorbide mononitrate (IMDUR) 60 MG 24 hr tablet TAKE 1 TABLET BY MOUTH EVERY DAY   lisinopril (ZESTRIL) 2.5 MG tablet TAKE 1 TABLET BY MOUTH EVERY DAY   nitroGLYCERIN (NITROSTAT) 0.4 MG SL tablet Place 1 tablet (0.4 mg total) under the tongue every 5 (five) minutes as needed for chest pain.   pantoprazole (PROTONIX) 40 MG tablet TAKE 1 TABLET (40 MG TOTAL) BY MOUTH DAILY FOR HEARTBURN.   rosuvastatin (CRESTOR) 20 MG tablet TAKE 1 TABLET  BY MOUTH EVERY DAY IN THE EVENING FOR CHOLESTEROL   tamsulosin (FLOMAX) 0.4 MG CAPS capsule TAKE 1 CAPSULE (0.4 MG TOTAL) BY MOUTH 2 (TWO) TIMES DAILY.   warfarin (COUMADIN) 5 MG tablet TAKE 1/2 TABLET DAILY BY MOUTH EXCEPT TAKE 1 TABLET ON MONDAYS AND THURSDAYS OR AS DIRECTED BY ANTICOAGULATION CLINIC   No facility-administered encounter medications on file as of 10/07/2021.    Allergies (verified) Januvia [sitagliptin] and Metformin and related   History: Past Medical History:  Diagnosis Date   Arthritis    "knees and lower back" (03/14/2013)   Atrial flutter (HCC)    radiofrequency ablation in 2001   CAD (coronary artery disease)    a. Nonobstructive. Cardiac cath in 2001-50% mid RI, normal LM, LAD, RCA b. cath 10/16/2014 95% mid RCA treated with DES, 99% ost D1 medical management due to small aneurysmal segment   Chronic anticoagulation    chronic Coumadin anticoagulation   Chronic obstructive pulmonary disease (HCC) 04/20/2011   Diabetes mellitus, type 2 (HCC)    GERD (gastroesophageal reflux disease)    Hyperlipidemia    Hypertension    with hypertensive heart disease   Left knee pain 10/25/2017   medial   Obesity    Persistent atrial fibrillation (HCC)    recurrent atrial flutter since 2001 s/p DCCVs, multiple failed AADs, h/o tachy-mediated cardiomyopathy   Shortness of breath    "can come on at any time" (03/14/2013)   Sleep apnea    "dx'd; couldn't wear the mask" (03/14/2013)   Tobacco abuse    Past Surgical History:  Procedure Laterality Date   ATRIAL FLUTTER ABLATION  2002   atrial flutter; subsequently developed atrial fibrillation   AV NODE ABLATION  01/24/2013   CARDIAC CATHETERIZATION  2002   CARDIAC CATHETERIZATION N/A 10/16/2014   Procedure: Left Heart Cath and Coronary Angiography;  Surgeon: Tonny Bollman, MD;  Location: [facility] INVASIVE CV LAB;  Service: Cardiovascular;  Laterality: N/A;   CARDIOVERSION  05/31/2011   Procedure: CARDIOVERSION;  Surgeon: [name].  [name], MD;  Location: [facility] ORS;  Service: Cardiovascular;  Laterality: N/A;   CARPAL TUNNEL RELEASE Left 1980's   COLONOSCOPY WITH PROPOFOL N/A 12/30/2018   Procedure: COLONOSCOPY WITH PROPOFOL;  Surgeon: Wyline Mood, MD;  Location[facility] ENDOSCOPY;  Service: Gastroenterology;  Laterality: N/A;   COLONOSCOPY WITH PROPOFOL N/A 12/04/2019   Procedure: COLONOSCOPY WITH PROPOFOL;  Surgeon: Wyline Mood, MD;  Location: [facility] ENDOSCOPY;  Service: Gastroenterology;  Laterality: N/A;   COLONOSCOPY WITH PROPOFOL N/A 06/13/2021   Procedure: COLONOSCOPY WITH PROPOFOL;  Surgeon: Wyline Mood, MD;  Location: [facility] ENDOSCOPY;  Service: Gastroenterology;  Laterality: N/A;   INSERT / REPLACE / REMOVE PACEMAKER  01/24/2013    Medtronic Adapta L dual-chamber pacemaker, serial number [id number]    LEFT HEART CATH AND CORONARY ANGIOGRAPHY N/A 06/07/2018   Procedure: LEFT HEART CATH AND CORONARY ANGIOGRAPHY;  Surgeon: Tonny Bollman, MD;  Location: [facility] INVASIVE CV LAB;  Service: Cardiovascular;  Laterality: N/A;   LEFT HEART CATHETERIZATION WITH CORONARY ANGIOGRAM N/A 03/17/2013   Procedure: LEFT HEART CATHETERIZATION WITH CORONARY ANGIOGRAM;  Surgeon: Kathleene Hazel, MD; LAD mild dz, D1 branch 100%, inferior branch 99%, CFX OK, RCA 50%, EF 65%     LOOP RECORDER IMPLANT  2002   PERMANENT PACEMAKER INSERTION N/A 01/24/2013   Procedure: PERMANENT PACEMAKER INSERTION;  Surgeon: Marinus Maw, MD;  Location: [facility] CATH LAB;  Service: Cardiovascular;  Laterality: N/A;   TIBIAL TUBERCLERPLASTY  ~ 2003   Family History  Problem Relation Age of Onset   Alzheimer's disease Mother    Osteoporosis Mother    Social History   Socioeconomic History   Marital status: Widowed    Spouse name: Not on file   Number of children: 1   Years of education: Not on file   Highest education level: Not on file  Occupational History   Occupation: Unemployed    Employer: UNEMPLOYED  Tobacco Use   Smoking status: Former     Packs/day: 1.00    Years: 42.00    Total pack years: 42.00    Types: Cigarettes    Quit date: 12/30/2013    Years since quitting: 7.7   Smokeless tobacco: Never  Vaping Use   Vaping Use: Never used  Substance and Sexual Activity   Alcohol use: Not Currently    Comment: 03/14/2013 "stopped drinking back in 2002; never had problem w/it"   Drug use: No   Sexual activity: Not Currently  Other Topics Concern   Not on file  Social History Narrative   Single.   Retired.    1 son, deceased.    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   Enjoys playing on the computer.       Social Determinants of Health   Financial Resource Strain: Low Risk  (10/07/2021)   Overall Financial Resource Strain (CARDIA)    Difficulty of Paying Living Expenses: Not hard at all  Food Insecurity: No Food Insecurity (10/07/2021)   Hunger Vital Sign    Worried About Running Out of Food in the Last Year: Never true    Ran Out of Food in the Last Year: Never true  Transportation Needs: No Transportation Needs (10/07/2021)   PRAPARE - Administrator, Civil Service (Medical): No    Lack of Transportation (Non-Medical): No  Physical Activity: Unknown (10/07/2021)   Exercise Vital Sign    Days of Exercise per Week: 3 days    Minutes of Exercise per Session: Not on file  Stress: No Stress Concern Present (10/07/2021)   [employer] of Occupational Health - Occupational Stress Questionnaire    Feeling of Stress : Not at all  Social Connections: Socially Isolated (10/07/2021)   Social Connection and Isolation Panel [NHANES]    Frequency of Communication with Friends and Family: Once a week    Frequency of Social Gatherings with Friends and Family: Once a week    Attends Religious Services: Never    Database administrator or Organizations: No    Attends Banker Meetings: Never    Marital Status: Widowed    Tobacco Counseling Counseling given: Not  Answered   Clinical Intake:  Pre-visit preparation completed: Yes  Pain : No/denies pain     Nutritional Risks: None Diabetes: Yes CBG done?: No Did pt. bring in CBG monitor from home?: No  How often do  you need to have someone help you when you read instructions, pamphlets, or other written materials from your doctor or pharmacy?: 1 - Never  Diabetic?yes Nutrition Risk Assessment:  Has the patient had any N/V/D within the last 2 months?  No  Does the patient have any non-healing wounds?  No  Has the patient had any unintentional weight loss or weight gain?  No   Diabetes:  Is the patient diabetic?  Yes  If diabetic, was a CBG obtained today?  No  Did the patient bring in their glucometer from home?  No  How often do you monitor your CBG's? Once/day   Financial Strains and Diabetes Management:  Are you having any financial strains with the device, your supplies or your medication? No .  Does the patient want to be seen by Chronic Care Management for management of their diabetes?  No  Would the patient like to be referred to a Nutritionist or for Diabetic Management?  No   Diabetic Exams:  Diabetic Eye Exam: Completed 08/28/20. Overdue for diabetic eye exam. Pt has been advised about the importance in completing this exam.   Diabetic Foot Exam: Completed 02/02/21. Pt has been advised about the importance in completing this exam.     Interpreter Needed?: No  Information entered by :: Kennedy Bucker, LPN   Activities of Daily Living    10/07/2021   11:57 AM  In your present state of health, do you have any difficulty performing the following activities:  Hearing? 0  Vision? 0  Difficulty concentrating or making decisions? 0  Walking or climbing stairs? 0  Dressing or bathing? 0  Doing errands, shopping? 0  Preparing Food and eating ? N  Using the Toilet? N  In the past six months, have you accidently leaked urine? N  Do you have problems with loss of bowel  control? N  Managing your Medications? N  Managing your Finances? N  Housekeeping or managing your Housekeeping? N    Patient Care Team: [name], NP as PCP - General (Internal Medicine) Marinus Maw, MD as PCP - Electrophysiology (Cardiology) Jake Bathe, MD as PCP - Cardiology (Cardiology) Rothbart, [name], MD (Inactive) (Cardiology) Carmina Miller, MD as Radiation Oncologist (Radiation Oncology) Kathyrn Sheriff, [facility] as Pharmacist (Pharmacist)  Indicate any recent Medical Services you may have received from other than [facility] providers in the past year (date may be approximate).     Assessment:   This is a routine wellness examination for [place].  Hearing/Vision screen Hearing Screening - Comments:: No aids Vision Screening - Comments:: Readers- [facility]  Dietary issues and exercise activities discussed: Current Exercise Habits: Home exercise routine, Type of exercise: walking, Time (Minutes): 30, Frequency (Times/Week): 3, Weekly Exercise (Minutes/Week): 90, Intensity: Mild   Goals Addressed             This Visit's Progress    DIET - EAT MORE FRUITS AND VEGETABLES         Depression Screen    10/07/2021   11:54 AM 04/08/2021    8:53 AM 02/02/2021   11:52 AM 12/30/2020   12:35 PM 12/21/2020    3:08 PM 10/07/2020    9:51 AM 10/06/2020   12:15 PM  PHQ 2/9 Scores  PHQ - 2 Score 0 0 0 2 0 0 0  PHQ- 9 Score 0 2 0 8  0 0    Fall Risk    10/07/2021   11:57 AM 10/06/2020  12:15 PM 10/06/2019   12:03 PM 08/12/2019    3:49 PM 09/23/2018    9:23 PM  Fall Risk   Falls in the past year? 0 0 0 0 0  Number falls in past yr: 0 0 0 0   Injury with Fall? 0 0 0 0   Risk for fall due to : No Fall Risks Medication side effect Medication side effect    Follow up Falls evaluation completed Falls prevention discussed;Falls evaluation completed Falls evaluation completed;Falls prevention discussed Falls evaluation completed     FALL RISK PREVENTION PERTAINING  TO THE HOME:  Any stairs in or around the home? No  If so, are there any without handrails? No  Home free of loose throw rugs in walkways, pet beds, electrical cords, etc? Yes  Adequate lighting in your home to reduce risk of falls? Yes   ASSISTIVE DEVICES UTILIZED TO PREVENT FALLS:  Life alert? No  Use of a cane, walker or w/c? No  Grab bars in the bathroom? No  Shower chair or bench in shower? Yes  Elevated toilet seat or a handicapped toilet? No    Cognitive Function:    10/06/2020   12:16 PM 10/06/2019   12:07 PM 09/23/2018    9:29 PM 09/21/2017    1:08 PM  MMSE - Mini Mental State Exam  Not completed: Refused Refused    Orientation to time   5 5  Orientation to Place   5 5  Registration   3 3  Attention/ Calculation   0 0  Recall   0 1  Recall-comments   unable to recall 0 of 3 words unable to recall 2 of 3 words  Language- name 2 objects   0 0  Language- repeat   1 1  Language- follow 3 step command   0 3  Language- read & follow direction   0 0  Write a sentence   0 0  Copy design   0 0  Total score   14 18        10/07/2021   11:59 AM  6CIT Screen  What Year? 0 points  What month? 0 points  What time? 0 points  Count back from 20 0 points  Months in reverse 0 points  Repeat phrase 0 points  Total Score 0 points    Immunizations Immunization History  Administered Date(s) Administered   Fluad Quad(high Dose 65+) 02/02/2021   Influenza, High Dose Seasonal PF 02/20/2018, 02/03/2019   Influenza,inj,Quad PF,6+ Mos 02/05/2014, 01/04/2015, 04/20/2016   Influenza-Unspecified 01/05/2011, 01/15/2013, 01/30/2020   PFIZER(Purple Top)SARS-COV-2 Vaccination 09/17/2019, 10/08/2019   Pneumococcal Conjugate-13 09/21/2017   Pneumococcal Polysaccharide-23 01/05/2011   Tdap 01/05/2011    TDAP status: Due, Education has been provided regarding the importance of this vaccine. Advised may receive this vaccine at local pharmacy or Health Dept. Aware to provide a copy of the  vaccination record if obtained from local pharmacy or Health Dept. Verbalized acceptance and understanding.  Flu Vaccine status: Up to date  Pneumococcal vaccine status: Up to date  Covid-19 vaccine status: Completed vaccines  Qualifies for Shingles Vaccine? Yes   Zostavax completed No   Shingrix Completed?: No.    Education has been provided regarding the importance of this vaccine. Patient has been advised to call insurance company to determine out of pocket expense if they have not yet received this vaccine. Advised may also receive vaccine at local pharmacy or Health Dept. Verbalized acceptance and understanding.  Screening Tests  Health Maintenance  Topic Date Due   Pneumonia Vaccine 68+ Years old (3 - PPSV23 if available, else PCV20) 09/22/2018   TETANUS/TDAP  01/04/2021   OPHTHALMOLOGY EXAM  08/28/2021   HEMOGLOBIN A1C  11/07/2021   INFLUENZA VACCINE  11/15/2021   FOOT EXAM  02/02/2022   COLONOSCOPY (Pts 45-90yrs Insurance coverage will need to be confirmed)  06/13/2024   Hepatitis C Screening  Completed   HPV VACCINES  Aged Out   COVID-19 Vaccine  Discontinued   Zoster Vaccines- Shingrix  Discontinued    Health Maintenance  Health Maintenance Due  Topic Date Due   Pneumonia Vaccine 45+ Years old (3 - PPSV23 if available, else PCV20) 09/22/2018   TETANUS/TDAP  01/04/2021   OPHTHALMOLOGY EXAM  08/28/2021    Colorectal cancer screening: Type of screening: Colonoscopy. Completed 06/13/21. Repeat every 3 years  Lung Cancer Screening: (Low Dose CT Chest recommended if Age 67-80 years, 30 pack-year currently smoking OR have quit w/in 15years.) does not qualify.   Additional Screening:  Hepatitis C Screening: does qualify; Completed 10/27/15  Vision Screening: Recommended annual ophthalmology exams for early detection of glaucoma and other disorders of the eye. Is the patient up to date with their annual eye exam?  Yes  Who is the provider or what is the name of the office  in which the patient attends annual eye exams? [facility] If pt is not established with a provider, would they like to be referred to a provider to establish care? No .   Dental Screening: Recommended annual dental exams for proper oral hygiene  Community Resource Referral / Chronic Care Management: CRR required this visit?  No   CCM required this visit?  No      Plan:     I have personally reviewed and noted the following in the patient's chart:   Medical and social history Use of alcohol, tobacco or illicit drugs  Current medications and supplements including opioid prescriptions. Patient is not currently taking opioid prescriptions. Functional ability and status Nutritional status Physical activity Advanced directives List of other physicians Hospitalizations, surgeries, and ER visits in previous 12 months Vitals Screenings to include cognitive, depression, and falls Referrals and appointments  In addition, I have reviewed and discussed with patient certain preventive protocols, quality metrics, and best practice recommendations. A written personalized care plan for preventive services as well as general preventive health recommendations were provided to patient.     Hal Hope, LPN   09/19/5407   Nurse Notes: none

## 2021-10-11 NOTE — Progress Notes (Signed)
I reviewed health advisor's note, was available for consultation, and agree with documentation and plan.  

## 2021-10-14 ENCOUNTER — Encounter: Payer: Medicare Other | Admitting: Primary Care

## 2021-10-18 ENCOUNTER — Other Ambulatory Visit: Payer: Self-pay | Admitting: Family

## 2021-10-18 DIAGNOSIS — J069 Acute upper respiratory infection, unspecified: Secondary | ICD-10-CM

## 2021-10-19 ENCOUNTER — Other Ambulatory Visit: Payer: Self-pay | Admitting: Primary Care

## 2021-10-19 DIAGNOSIS — F3341 Major depressive disorder, recurrent, in partial remission: Secondary | ICD-10-CM

## 2021-10-26 ENCOUNTER — Ambulatory Visit (INDEPENDENT_AMBULATORY_CARE_PROVIDER_SITE_OTHER): Payer: Medicare Other | Admitting: Primary Care

## 2021-10-26 ENCOUNTER — Encounter: Payer: Self-pay | Admitting: Primary Care

## 2021-10-26 VITALS — BP 110/67 | HR 82 | Temp 98.6°F | Ht 71.0 in | Wt 213.0 lb

## 2021-10-26 DIAGNOSIS — Z0000 Encounter for general adult medical examination without abnormal findings: Secondary | ICD-10-CM

## 2021-10-26 DIAGNOSIS — I10 Essential (primary) hypertension: Secondary | ICD-10-CM

## 2021-10-26 DIAGNOSIS — K219 Gastro-esophageal reflux disease without esophagitis: Secondary | ICD-10-CM

## 2021-10-26 DIAGNOSIS — E785 Hyperlipidemia, unspecified: Secondary | ICD-10-CM | POA: Diagnosis not present

## 2021-10-26 DIAGNOSIS — J439 Emphysema, unspecified: Secondary | ICD-10-CM | POA: Diagnosis not present

## 2021-10-26 DIAGNOSIS — I25119 Atherosclerotic heart disease of native coronary artery with unspecified angina pectoris: Secondary | ICD-10-CM | POA: Diagnosis not present

## 2021-10-26 DIAGNOSIS — I4819 Other persistent atrial fibrillation: Secondary | ICD-10-CM

## 2021-10-26 DIAGNOSIS — N182 Chronic kidney disease, stage 2 (mild): Secondary | ICD-10-CM | POA: Diagnosis not present

## 2021-10-26 DIAGNOSIS — F3341 Major depressive disorder, recurrent, in partial remission: Secondary | ICD-10-CM | POA: Diagnosis not present

## 2021-10-26 DIAGNOSIS — Z950 Presence of cardiac pacemaker: Secondary | ICD-10-CM

## 2021-10-26 DIAGNOSIS — E1340 Other specified diabetes mellitus with diabetic neuropathy, unspecified: Secondary | ICD-10-CM

## 2021-10-26 DIAGNOSIS — R61 Generalized hyperhidrosis: Secondary | ICD-10-CM

## 2021-10-26 DIAGNOSIS — E1165 Type 2 diabetes mellitus with hyperglycemia: Secondary | ICD-10-CM | POA: Diagnosis not present

## 2021-10-26 DIAGNOSIS — Z8546 Personal history of malignant neoplasm of prostate: Secondary | ICD-10-CM

## 2021-10-26 HISTORY — DX: Generalized hyperhidrosis: R61

## 2021-10-26 LAB — COMPREHENSIVE METABOLIC PANEL
ALT: 17 U/L (ref 0–53)
AST: 20 U/L (ref 0–37)
Albumin: 4.2 g/dL (ref 3.5–5.2)
Alkaline Phosphatase: 56 U/L (ref 39–117)
BUN: 22 mg/dL (ref 6–23)
CO2: 25 mEq/L (ref 19–32)
Calcium: 9.2 mg/dL (ref 8.4–10.5)
Chloride: 104 mEq/L (ref 96–112)
Creatinine, Ser: 1.06 mg/dL (ref 0.40–1.50)
GFR: 71.64 mL/min (ref >60.00)
Glucose, Bld: 165 mg/dL — ABNORMAL HIGH (ref 70–99)
Potassium: 4.3 mEq/L (ref 3.5–5.1)
Sodium: 134 mEq/L — ABNORMAL LOW (ref 135–145)
Total Bilirubin: 0.6 mg/dL (ref 0.2–1.2)
Total Protein: 6.4 g/dL (ref 6.0–8.3)

## 2021-10-26 LAB — LIPID PANEL
Cholesterol: 132 mg/dL (ref 0–200)
HDL: 50.1 mg/dL (ref >39.00)
LDL Cholesterol: 49 mg/dL (ref 0–99)
NonHDL: 82.06
Total CHOL/HDL Ratio: 3
Triglycerides: 166 mg/dL — ABNORMAL HIGH (ref 0.0–149.0)
VLDL: 33.2 mg/dL (ref 0.0–40.0)

## 2021-10-26 LAB — HEMOGLOBIN A1C: Hgb A1c MFr Bld: 7.5 % — ABNORMAL HIGH (ref 4.6–6.5)

## 2021-10-26 MED ORDER — SERTRALINE HCL 50 MG PO TABS: 50.0000 mg | ORAL_TABLET | Freq: Every day | ORAL | 0 refills | Status: DC

## 2021-10-26 NOTE — Assessment & Plan Note (Addendum)
Mostly nonexertional, occurring at night. No alarm signs on exam.  Reviewed chemical stress test from June 2023 which was fortunately low risk. Reviewed PSA from April 2023, stable.  Since the dose increase of duloxetine was the last change made to medications, we will switch from duloxetine to Zoloft for depression. He will update in 1 month.  Other labs pending.

## 2021-10-26 NOTE — Assessment & Plan Note (Signed)
Shingrix vaccines due, he never had chickenpox as a child.  Other vaccines up-to-date. Colonoscopy up-to-date, due 2026. PSA up-to-date and reviewed.  Discussed the importance of a healthy diet and regular exercise in order for weight loss, and to reduce the risk of further co-morbidity.  Exam today stable.  Labs pending.

## 2021-10-26 NOTE — Assessment & Plan Note (Addendum)
Rate controlled. Following with cardiology, office notes from May 2023 reviewed. Continue warfarin per Coumadin clinic.

## 2021-10-26 NOTE — Assessment & Plan Note (Signed)
Controlled.  Continue pantoprazole 40 mg daily. 

## 2021-10-26 NOTE — Assessment & Plan Note (Signed)
Following with urology. PSA from April 2023 reviewed.  Continue tamsulosin 0.4 mg daily.

## 2021-10-26 NOTE — Assessment & Plan Note (Signed)
Repeat renal function pending.  Continue lisinopril 2.5 mg daily.

## 2021-10-26 NOTE — Assessment & Plan Note (Signed)
Repeat A1c pending today.  Continue Jardiance 25 mg daily, glipizide XL 10 mg daily.

## 2021-10-26 NOTE — Assessment & Plan Note (Signed)
Controlled. Continue gabapentin 300 mg in the morning and 600 mg at night.

## 2021-10-26 NOTE — Patient Instructions (Signed)
Stop by the lab prior to leaving today. I will notify you of your results once received.   Stop taking duloxetine (Cymbalta) for depression as this may be causing your sweating.  Start taking sertraline (Zoloft) 50 mg once daily for depression.  Please update me in 1 month regarding your sweating and depression.  Please schedule a follow up visit for 6 months for a diabetes check.  It was a pleasure to see you today!  Preventive Care 72 Years and Older, Male Preventive care refers to lifestyle choices and visits with your health care provider that can promote health and wellness. Preventive care visits are also called wellness exams. What can I expect for my preventive care visit? Counseling During your preventive care visit, your health care provider may ask about your: Medical history, including: Past medical problems. Family medical history. History of falls. Current health, including: Emotional well-being. Home life and relationship well-being. Sexual activity. Memory and ability to understand (cognition). Lifestyle, including: Alcohol, nicotine or tobacco, and drug use. Access to firearms. Diet, exercise, and sleep habits. Work and work Statistician. Sunscreen use. Safety issues such as seatbelt and bike helmet use. Physical exam Your health care provider will check your: Height and weight. These may be used to calculate your BMI (body mass index). BMI is a measurement that tells if you are at a healthy weight. Waist circumference. This measures the distance around your waistline. This measurement also tells if you are at a healthy weight and may help predict your risk of certain diseases, such as type 2 diabetes and high blood pressure. Heart rate and blood pressure. Body temperature. Skin for abnormal spots. What immunizations do I need?  Vaccines are usually given at various ages, according to a schedule. Your health care provider will recommend vaccines for you based  on your age, medical history, and lifestyle or other factors, such as travel or where you work. What tests do I need? Screening Your health care provider may recommend screening tests for certain conditions. This may include: Lipid and cholesterol levels. Diabetes screening. This is done by checking your blood sugar (glucose) after you have not eaten for a while (fasting). Hepatitis C test. Hepatitis B test. HIV (human immunodeficiency virus) test. STI (sexually transmitted infection) testing, if you are at risk. Lung cancer screening. Colorectal cancer screening. Prostate cancer screening. Abdominal aortic aneurysm (AAA) screening. You may need this if you are a current or former smoker. Talk with your health care provider about your test results, treatment options, and if necessary, the need for more tests. Follow these instructions at home: Eating and drinking  Eat a diet that includes fresh fruits and vegetables, whole grains, lean protein, and low-fat dairy products. Limit your intake of foods with high amounts of sugar, saturated fats, and salt. Take vitamin and mineral supplements as recommended by your health care provider. Do not drink alcohol if your health care provider tells you not to drink. If you drink alcohol: Limit how much you have to 0-2 drinks a day. Know how much alcohol is in your drink. In the [place] one drink equals one 12 oz bottle of beer (355 mL), one 5 oz glass of wine (148 mL), or one 1 oz glass of hard liquor (44 mL). Lifestyle Brush your teeth every morning and night with fluoride toothpaste. Floss one time each day. Exercise for at least 30 minutes 5 or more days each week. Do not use any products that contain nicotine or tobacco. These products include  cigarettes, chewing tobacco, and vaping devices, such as e-cigarettes. If you need help quitting, ask your health care provider. Do not use drugs. If you are sexually active, practice safe sex. Use a condom  or other form of protection to prevent STIs. Take aspirin only as told by your health care provider. Make sure that you understand how much to take and what form to take. Work with your health care provider to find out whether it is safe and beneficial for you to take aspirin daily. Ask your health care provider if you need to take a cholesterol-lowering medicine (statin). Find healthy ways to manage stress, such as: Meditation, yoga, or listening to music. Journaling. Talking to a trusted person. Spending time with friends and family. Safety Always wear your seat belt while driving or riding in a vehicle. Do not drive: If you have been drinking alcohol. Do not ride with someone who has been drinking. When you are tired or distracted. While texting. If you have been using any mind-altering substances or drugs. Wear a helmet and other protective equipment during sports activities. If you have firearms in your house, make sure you follow all gun safety procedures. Minimize exposure to UV radiation to reduce your risk of skin cancer. What's next? Visit your health care provider once a year for an annual wellness visit. Ask your health care provider how often you should have your eyes and teeth checked. Stay up to date on all vaccines. This information is not intended to replace advice given to you by your health care provider. Make sure you discuss any questions you have with your health care provider. Document Revised: 09/29/2020 Document Reviewed: 09/29/2020 Elsevier Patient Education  [name] Cook.

## 2021-10-26 NOTE — Assessment & Plan Note (Signed)
Continue rosuvastatin 20 mg daily. Repeat lipid panel pending. 

## 2021-10-26 NOTE — Assessment & Plan Note (Signed)
Stable.  Reviewed remote pacemaker check from June 2023.

## 2021-10-26 NOTE — Assessment & Plan Note (Signed)
Controlled, however it is possible that the increased dose of duloxetine could be contributing to his sweats.  Stop duloxetine 90 mg tablets. Start sertraline 50 mg tablets once daily.  He will update in 1 month.

## 2021-10-26 NOTE — Assessment & Plan Note (Addendum)
Following with cardiology, asymptomatic today. Do not suspect his sweats are secondary to cardiac cause, especially given his low risk chemical stress test from last month.  Continue blood pressure control, diabetes control, lipid control. Continue Imdur 60 mg daily, clopidogrel 75 mg daily.

## 2021-10-26 NOTE — Assessment & Plan Note (Signed)
Controlled.  Continue furosemide 20 mg daily, lisinopril 2.5 mg daily.

## 2021-10-26 NOTE — Progress Notes (Signed)
Subjective:    Patient ID: [name] Cook, male    DOB: [name] 08, 1951, 71 y.o.   MRN: [id number]  HPI  [name] Cook is a very pleasant 71 y.o. male who presents today for complete physical and follow up of chronic conditions.  Over the last several months he has noticed intermittent and sporadic sweating.  He will break out in sweats with rest and exertion.  He mostly notices his sweats when laying in bed before he goes to sleep.  He denies chest pain, shortness of breath, unexplained weight loss.  He follows regularly with urology for his history of prostate cancer.  He denies changes in his medication regimen except for the addition of Cymbalta 30 mg.  He believes that his sweats began around that time.  He underwent a chemical stress test in June 2023 which was low risk.  Immunizations: -Tetanus: 2012 -Influenza: Completed last season  -Covid-19: 2 vaccines -Shingles: Never completed, never had chicken pox -Pneumonia: Prevnar 13 in 2019, pneumovax 23 in 2012  Diet: Ward.  Exercise: No regular exercise. Active at home.   Eye exam: Completes annually  Dental exam: Completed numerous years ago  Colonoscopy: Completed in 2023, due 2026  PSA: Due  BP Readings from Last 3 Encounters:  10/26/21 110/67  09/14/21 (!) 90/50  08/19/21 126/64          Review of Systems  Constitutional:  Positive for diaphoresis. Negative for unexpected weight change.  HENT:  Negative for rhinorrhea.   Respiratory:  Negative for cough and shortness of breath.   Cardiovascular:  Negative for chest pain.  Gastrointestinal:  Negative for constipation and diarrhea.  Genitourinary:  Negative for difficulty urinating.  Musculoskeletal:  Negative for arthralgias and myalgias.  Skin:  Negative for rash.  Allergic/Immunologic: Negative for environmental allergies.  Neurological:  Negative for dizziness, numbness and headaches.  Psychiatric/Behavioral:  The patient is not nervous/anxious.           Past Medical History:  Diagnosis Date   Acute bronchitis with COPD ([place]) 03/07/2021   Arthritis    "knees and lower back" (03/14/2013)   Atrial flutter ([place])    radiofrequency ablation in 2001   CAD (coronary artery disease)    a. Nonobstructive. Cardiac cath in 2001-50% mid RI, normal LM, LAD, RCA b. cath 10/16/2014 95% mid RCA treated with DES, 99% ost D1 medical management due to small aneurysmal segment   Chronic anticoagulation    chronic Coumadin anticoagulation   Chronic obstructive pulmonary disease ([place]) 04/20/2011   Diabetes mellitus, type 2 (HCC)    Elevated lipase 06/23/2021   GERD (gastroesophageal reflux disease)    Hyperlipidemia    Hypertension    with hypertensive heart disease   Left knee pain 10/25/2017   medial   Obesity    Persistent atrial fibrillation ([place])    recurrent atrial flutter since 2001 s/p DCCVs, multiple failed AADs, h/o tachy-mediated cardiomyopathy   Shortness of breath    "can come on at any time" (03/14/2013)   Sleep apnea    "dx'd; couldn't wear the mask" (03/14/2013)   Tobacco abuse     Social History   Socioeconomic History   Marital status: Widowed    Spouse name: Not on file   Number of children: 1   Years of education: Not on file   Highest education level: Not on file  Occupational History   Occupation: Unemployed    Employer: UNEMPLOYED  Tobacco Use   Smoking status: Former  Packs/day: 1.00    Years: 42.00    Total pack years: 42.00    Types: Cigarettes    Quit date: 12/30/2013    Years since quitting: 7.8   Smokeless tobacco: Never  Vaping Use   Vaping Use: Never used  Substance and Sexual Activity   Alcohol use: Not Currently    Comment: 03/14/2013 "stopped drinking back in 2002; never had problem w/it"   Drug use: No   Sexual activity: Not Currently  Other Topics Concern   Not on file  Social History Narrative   Single.   Retired.    1 son, deceased.    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   Enjoys playing on the computer.       Social Determinants of Health   Financial Resource Strain: Low Risk  (10/07/2021)   Overall Financial Resource Strain (CARDIA)    Difficulty of Paying Living Expenses: Not hard at all  Food Insecurity: No Food Insecurity (10/07/2021)   Hunger Vital Sign    Worried About Running Out of Food in the Last Year: Never true    Ran Out of Food in the Last Year: Never true  Transportation Needs: No Transportation Needs (10/07/2021)   PRAPARE - Hydrologist (Medical): No    Lack of Transportation (Non-Medical): No  Physical Activity: Unknown (10/07/2021)   Exercise Vital Sign    Days of Exercise per Week: 3 days    Minutes of Exercise per Session: Not on file  Stress: No Stress Concern Present (10/07/2021)   [place]    Feeling of Stress : Not at all  Social Connections: Socially Isolated (10/07/2021)   Social Connection and Isolation Panel [NHANES]    Frequency of Communication with Friends and Family: Once a week    Frequency of Social Gatherings with Friends and Family: Once a week    Attends Religious Services: Never    Marine scientist or Organizations: No    Attends Archivist Meetings: Never    Marital Status: Widowed  Intimate Partner Violence: Not At Risk (10/07/2021)   Humiliation, Afraid, Rape, and Kick questionnaire    Fear of Current or Ex-Partner: No    Emotionally Abused: No    Physically Abused: No    Sexually Abused: No    Past Surgical History:  Procedure Laterality Date   ATRIAL FLUTTER ABLATION  2002   atrial flutter; subsequently developed atrial fibrillation   AV NODE ABLATION  01/24/2013   CARDIAC CATHETERIZATION  2002   CARDIAC CATHETERIZATION N/A 10/16/2014   Procedure: Left Heart Cath and Coronary Angiography;  Surgeon: [name], MD;  Location: [place] [facility];  Service:  Cardiovascular;  Laterality: N/A;   CARDIOVERSION  05/31/2011   Procedure: CARDIOVERSION;  Surgeon: Cristopher Estimable. Lattie Haw, MD;  Location: [facility];  Service: Cardiovascular;  Laterality: N/A;   CARPAL TUNNEL RELEASE Left 1980's   COLONOSCOPY WITH PROPOFOL N/A 12/30/2018   Procedure: COLONOSCOPY WITH PROPOFOL;  Surgeon: Jonathon Bellows, MD;  Location: [facility] END[facility]COPY;  Service: Gastroenterology;  Laterality: N/A;   COLONOSCOPY WITH PROPOFOL N/A 12/04/2019   Procedure: COLONOSCOPY WITH PROPOFOL;  Surgeon: Jonathon Bellows, MD;  Location: [facility] ENDOSCOPY;  Service: Gastroenterology;  Laterality: N/A;   COLONOSCOPY WITH PROPOFOL N/A 06/13/2021   Procedure: COLONOSCOPY WITH PROPOFOL;  Surgeon: Jonathon Bellows, MD;  Location: [facility] ENDOSCOPY;  Service: Gastroenterology;  Laterality: N/A;   INSERT /  REPLACE / REMOVE PACEMAKER  01/24/2013    Medtronic Adapta L dual-chamber pacemaker, serial number [id number]    LEFT HEART CATH AND CORONARY ANGIOGRAPHY N/A 06/07/2018   Procedure: LEFT HEART CATH AND CORONARY ANGIOGRAPHY;  Surgeon: [name], MD;  Location: [place] [facility];  Service: Cardiovascular;  Laterality: N/A;   LEFT HEART CATHETERIZATION WITH CORONARY ANGIOGRAM N/A 03/17/2013   Procedure: LEFT HEART CATHETERIZATION WITH CORONARY ANGIOGRAM;  Surgeon: Burnell Blanks, MD; LAD mild dz, D1 branch 100%, inferior branch 99%, CFX OK, RCA 50%, EF 65%     LOOP RECORDER IMPLANT  2002   PERMANENT PACEMAKER INSERTION N/A 01/24/2013   Procedure: PERMANENT PACEMAKER INSERTION;  Surgeon: [name], MD;  Location: [facility] CATH LAB;  Service: Cardiovascular;  Laterality: N/A;   TIBIAL TUBERCLERPLASTY  ~ 2003    Family History  Problem Relation Age of Onset   Alzheimer's disease Mother    Osteoporosis Mother     Allergies  Allergen Reactions   Januvia [Sitagliptin] Other (See Comments)    Elevated lipase   Metformin And Related Diarrhea    Current Outpatient Medications on File Prior to Visit  Medication Sig  Dispense Refill   albuterol (VENTOLIN HFA) 108 (90 Base) MCG/ACT inhaler INHALE 2 PUFFS BY MOUTH EVERY 4 HOURS AS NEEDED FOR WHEEZE OR FOR SHORTNESS OF BREATH 6.7 each 0   blood glucose meter kit and supplies KIT Dispense based on patient and insurance preference. Use up to four times daily as directed. (FOR ICD-9 250.00, 250.01). 1 each 0   budesonide-formoterol (SYMBICORT) 160-4.5 MCG/ACT inhaler INHALE 2 PUFFS INTO THE LUNGS TWICE A DAY 10.2 each 5   cetirizine (ZYRTEC) 10 MG tablet TAKE 1 TABLET BY MOUTH EVERYDAY AT BEDTIME 30 tablet 0   clopidogrel (PLAVIX) 75 MG tablet TAKE 1 TABLET BY MOUTH EVERY DAY 90 tablet 2   diclofenac Sodium (VOLTAREN) 1 % GEL Apply 2 g topically 4 (four) times daily. 50 g 0   empagliflozin (JARDIANCE) 25 MG TABS tablet Take 1 tablet (25 mg total) by mouth daily before breakfast. For diabetes. 90 tablet 1   fish oil-omega-3 fatty acids 1000 MG capsule Take 1 g by mouth daily.       fluticasone (FLONASE) 50 MCG/ACT nasal spray PLACE 1 SPRAY INTO BOTH NOSTRILS DAILY AS NEEDED FOR ALLERGIES OR RHINITIS. 48 mL 3   furosemide (LASIX) 20 MG tablet TAKE 1 TABLET BY MOUTH EVERY DAY 90 tablet 2   gabapentin (NEURONTIN) 300 MG capsule Take 1 capsule by mouth every morning and 2 capsules by mouth at night for neuropathy 270 capsule 0   glipiZIDE (GLIPIZIDE XL) 10 MG 24 hr tablet Take 1 tablet (10 mg total) by mouth daily with breakfast. For diabetes. 90 tablet 1   glucose blood test strip 1 each by Other route as needed for other. Use to check blood sugar up to three times a day-Accu-Chek meter     isosorbide mononitrate (IMDUR) 60 MG 24 hr tablet TAKE 1 TABLET BY MOUTH EVERY DAY 90 tablet 1   lisinopril (ZESTRIL) 2.5 MG tablet TAKE 1 TABLET BY MOUTH EVERY DAY 90 tablet 3   nitroGLYCERIN (NITROSTAT) 0.4 MG SL tablet Place 1 tablet (0.4 mg total) under the tongue every 5 (five) minutes as needed for chest pain. 25 tablet 0   pantoprazole (PROTONIX) 40 MG tablet TAKE 1 TABLET (40  MG TOTAL) BY MOUTH DAILY FOR HEARTBURN. 90 tablet 3   rosuvastatin (CRESTOR) 20 MG tablet TAKE  1 TABLET BY MOUTH EVERY DAY IN THE EVENING FOR CHOLESTEROL 90 tablet 0   tamsulosin (FLOMAX) 0.4 MG CAPS capsule TAKE 1 CAPSULE (0.4 MG TOTAL) BY MOUTH 2 (TWO) TIMES DAILY. 180 capsule 3   warfarin (COUMADIN) 5 MG tablet TAKE 1/2 TABLET DAILY BY MOUTH EXCEPT TAKE 1 TABLET ON MONDAYS AND THURSDAYS OR AS DIRECTED BY ANTICOAGULATION CLINIC 90 tablet 0   No current facility-administered medications on file prior to visit.    BP 110/67   Pulse 82   Temp 98.6 F (37 C) (Oral)   Ht '5\' 11"'  (1.803 m)   Wt 213 lb (96.6 kg)   SpO2 95%   BMI 29.71 kg/m  Objective:   Physical Exam HENT:     Right Ear: Tympanic membrane and ear canal normal.     Left Ear: Tympanic membrane and ear canal normal.     Nose: Nose normal.     Right Sinus: No maxillary sinus tenderness or frontal sinus tenderness.     Left Sinus: No maxillary sinus tenderness or frontal sinus tenderness.  Eyes:     Conjunctiva/sclera: Conjunctivae normal.  Neck:     Thyroid: No thyromegaly.     Vascular: No carotid bruit.  Cardiovascular:     Rate and Rhythm: Normal rate and regular rhythm.     Heart sounds: Normal heart sounds.  Pulmonary:     Effort: Pulmonary effort is normal.     Breath sounds: Normal breath sounds. No wheezing or rales.  Abdominal:     General: Bowel sounds are normal.     Palpations: Abdomen is soft.     Tenderness: There is no abdominal tenderness.  Musculoskeletal:        General: Normal range of motion.     Cervical back: Neck supple.  Skin:    General: Skin is warm and dry.  Neurological:     Mental Status: He is alert and oriented to person, place, and time.     Cranial Nerves: No cranial nerve deficit.     Deep Tendon Reflexes: Reflexes are normal and symmetric.  Psychiatric:        Mood and Affect: Mood normal.           Assessment & Plan:   Problem List Items Addressed This Visit        Cardiovascular and Mediastinum   Atrial fibrillation    Rate controlled. Following with cardiology, office notes from May 2023 reviewed. Continue warfarin per Coumadin clinic.      Hypertension    Controlled.  Continue furosemide 20 mg daily, lisinopril 2.5 mg daily.      Coronary artery disease    Following with cardiology, asymptomatic today. Do not suspect his sweats are secondary to cardiac cause, especially given his low risk chemical stress test from last month.  Continue blood pressure control, diabetes control, lipid control. Continue Imdur 60 mg daily, clopidogrel 75 mg daily.        Respiratory   Chronic obstructive pulmonary disease (HCC)    Controlled.  Continue Symbicort 160-80, 2 puffs twice daily. Continue albuterol inhaler as needed.        Digestive   GASTROESOPHAGEAL REFLUX DISEASE    Controlled.  Continue pantoprazole 40 mg daily.        Endocrine   Type 2 diabetes mellitus with hyperglycemia (HCC)    Repeat A1c pending today.  Continue Jardiance 25 mg daily, glipizide XL 10 mg daily.      Relevant Orders  Hemoglobin A1c   Neuropathy due to secondary diabetes (HCC)    Controlled. Continue gabapentin 300 mg in the morning and 600 mg at night.        Genitourinary   Chronic kidney disease, stage II (mild)    Repeat renal function pending.  Continue lisinopril 2.5 mg daily.        Other   Hyperlipidemia    Continue rosuvastatin 20 mg daily. Repeat lipid panel pending.      Relevant Orders   Lipid panel   Comprehensive metabolic panel   Pacemaker    Stable.  Reviewed remote pacemaker check from June 2023.      Depression    Controlled, however it is possible that the increased dose of duloxetine could be contributing to his sweats.  Stop duloxetine 90 mg tablets. Start sertraline 50 mg tablets once daily.  He will update in 1 month.      Relevant Medications   sertraline (ZOLOFT) 50 MG tablet   History of  prostate cancer    Following with urology. PSA from April 2023 reviewed.  Continue tamsulosin 0.4 mg daily.      Preventative health care - Primary    Shingrix vaccines due, he never had chickenpox as a child.  Other vaccines up-to-date. Colonoscopy up-to-date, due 2026. PSA up-to-date and reviewed.  Discussed the importance of a healthy diet and regular exercise in order for weight loss, and to reduce the risk of further co-morbidity.  Exam today stable.  Labs pending.      Diaphoresis    Mostly nonexertional, occurring at night. No alarm signs on exam.  Reviewed chemical stress test from June 2023 which was fortunately low risk. Reviewed PSA from April 2023, stable.  Since the dose increase of duloxetine was the last change made to medications, we will switch from duloxetine to Zoloft for depression. He will update in 1 month.  Other labs pending.          [name], NP

## 2021-10-26 NOTE — Assessment & Plan Note (Signed)
Controlled.  Continue Symbicort 160-80, 2 puffs twice daily. Continue albuterol inhaler as needed.

## 2021-11-16 ENCOUNTER — Other Ambulatory Visit: Payer: Self-pay | Admitting: Primary Care

## 2021-11-16 DIAGNOSIS — F339 Major depressive disorder, recurrent, unspecified: Secondary | ICD-10-CM

## 2021-11-17 ENCOUNTER — Other Ambulatory Visit: Payer: Self-pay | Admitting: Primary Care

## 2021-11-17 ENCOUNTER — Ambulatory Visit (INDEPENDENT_AMBULATORY_CARE_PROVIDER_SITE_OTHER): Payer: Medicare Other

## 2021-11-17 DIAGNOSIS — E1142 Type 2 diabetes mellitus with diabetic polyneuropathy: Secondary | ICD-10-CM

## 2021-11-17 DIAGNOSIS — Z7901 Long term (current) use of anticoagulants: Secondary | ICD-10-CM

## 2021-11-17 LAB — POCT INR: INR: 2.2 (ref 2.0–3.0)

## 2021-11-17 NOTE — Patient Instructions (Addendum)
Pre visit review using our clinic review tool, if applicable. No additional management support is needed unless otherwise documented below in the visit note.  Continue 2.'5mg'$  daily except take '5mg'$  on Mondays and Thursdays. Recheck in 6 week.

## 2021-11-17 NOTE — Progress Notes (Signed)
Continue 2.'5mg'$  daily except take '5mg'$  on Mondays and Thursdays. Recheck in 6 week.

## 2021-11-24 ENCOUNTER — Other Ambulatory Visit: Payer: Self-pay | Admitting: Primary Care

## 2021-11-24 DIAGNOSIS — E785 Hyperlipidemia, unspecified: Secondary | ICD-10-CM

## 2021-11-25 ENCOUNTER — Other Ambulatory Visit: Payer: Self-pay | Admitting: Primary Care

## 2021-11-25 DIAGNOSIS — Z7901 Long term (current) use of anticoagulants: Secondary | ICD-10-CM

## 2021-12-05 ENCOUNTER — Telehealth: Payer: Self-pay

## 2021-12-05 ENCOUNTER — Other Ambulatory Visit: Payer: Self-pay | Admitting: Primary Care

## 2021-12-05 DIAGNOSIS — E1165 Type 2 diabetes mellitus with hyperglycemia: Secondary | ICD-10-CM

## 2021-12-05 NOTE — Progress Notes (Signed)
    Chronic Care Management Pharmacy Assistant   Name: [name] Cook  MRN: [id number] DOB: [birth year]  Reason for Encounter: CCM (Eye Exam)   Contacted patient in regards to updated eye exam information. Patient was offered eye exam at [facility]. Patient did confirm he is overdue, but denied an exam at [facility]. Patient stated he would go soon to get his exam.   Charlene Brooke, CPP notified  Marijean Niemann, [place] Pharmacy Assistant [phone]

## 2021-12-12 ENCOUNTER — Other Ambulatory Visit: Payer: Self-pay | Admitting: Primary Care

## 2021-12-12 DIAGNOSIS — J439 Emphysema, unspecified: Secondary | ICD-10-CM

## 2021-12-12 DIAGNOSIS — F3341 Major depressive disorder, recurrent, in partial remission: Secondary | ICD-10-CM

## 2021-12-16 ENCOUNTER — Other Ambulatory Visit: Payer: Medicare Other

## 2021-12-16 ENCOUNTER — Encounter: Payer: Self-pay | Admitting: Urology

## 2021-12-18 NOTE — Progress Notes (Unsigned)
12/20/2021 6:15 PM   [name] Cook [birth year] [id number]  Referring provider: [name], NP [place],  [place] [address]  No chief complaint on file.  Urological history: 1. ED -contributing factors of age, CAD, sleep apnea, COPD, history of smoking, HTN, HLD and diabetes - not a candidate for PDE5i's secondary due nitrate use  2. Prostate cancer -PSA pending -iPSA 6.10 -prostate biopsy (2020) - Prostate biopsy revealed 6/12 cores, all on the right including 3 cores of Gleason 3+3 (up to 99%) and 3 cores of 3+4 up to 96%.  High PSA 6.1.  Rectal exam was abnormal with a nodule at the right apex and firm on the side.  TRUS vol 50 with small median lobe, irregular on right. -cT2 prostate cancer, high-volume intermediate risk involving right-sided gland only -treatment with EBRT and short-term Lupron.  He received his dose in 06/2018.  He completed radiation in 08/2018.  6. BPH w/LU TS -I PSS *** -Tamsulosin 0.4 mg daily  HPI: [name] Cook is a 71 y.o. male who presents today for 6 month follow up.   He was scheduled for IPP, but his insurance would not cover the procedure.  He is scheduled to have ICI titration with Dr. Junious Silk on 06/23/2021.      Score:  1-7 Mild 8-19 Moderate 20-35 Severe   PMH: Past Medical History:  Diagnosis Date   Acute bronchitis with COPD ([place]) 03/07/2021   Arthritis    "knees and lower back" (03/14/2013)   Atrial flutter ([place])    radiofrequency ablation in 2001   CAD (coronary artery disease)    a. Nonobstructive. Cardiac cath in 2001-50% mid RI, normal LM, LAD, RCA b. cath 10/16/2014 95% mid RCA treated with DES, 99% ost D1 medical management due to small aneurysmal segment   Chronic anticoagulation    chronic Coumadin anticoagulation   Chronic obstructive pulmonary disease ([place]) 04/20/2011   Diabetes mellitus, type 2 (HCC)    Elevated lipase 06/23/2021   GERD (gastroesophageal reflux disease)    Hyperlipidemia     Hypertension    with hypertensive heart disease   Left knee pain 10/25/2017   medial   Obesity    Persistent atrial fibrillation ([place])    recurrent atrial flutter since 2001 s/p DCCVs, multiple failed AADs, h/o tachy-mediated cardiomyopathy   Shortness of breath    "can come on at any time" (03/14/2013)   Sleep apnea    "dx'd; couldn't wear the mask" (03/14/2013)   Tobacco abuse     Surgical History: Past Surgical History:  Procedure Laterality Date   ATRIAL FLUTTER ABLATION  2002   atrial flutter; subsequently developed atrial fibrillation   AV NODE ABLATION  01/24/2013   CARDIAC CATHETERIZATION  2002   CARDIAC CATHETERIZATION N/A 10/16/2014   Procedure: Left Heart Cath and Coronary Angiography;  Surgeon: [name], MD;  Location: [facility];  Service: Cardiovascular;  Laterality: N/A;   CARDIOVERSION  05/31/2011   Procedure: CARDIOVERSION;  Surgeon: Cristopher Estimable. Lattie Haw, MD;  Location: [facility];  Service: Cardiovascular;  Laterality: N/A;   CARPAL TUNNEL RELEASE Left 1980's   COLONOSCOPY WITH PROPOFOL N/A 12/30/2018   Procedure: COLONOSCOPY WITH PROPOFOL;  Surgeon: Jonathon Bellows, MD;  Location: [facility] ENDOSCOPY;  Service: Gastroenterology;  Laterality: N/A;   COLONOSCOPY WITH PROPOFOL N/A 12/04/2019   Procedure: COLONOSCOPY WITH PROPOFOL;  Surgeon: Jonathon Bellows, MD;  Location: [facility] ENDOSCOPY;  Service: Gastroenterology;  Laterality: N/A;   COLONOSCOPY WITH PROPOFOL  N/A 06/13/2021   Procedure: COLONOSCOPY WITH PROPOFOL;  Surgeon: Jonathon Bellows, MD;  Location: [facility] ENDOSCOPY;  Service: Gastroenterology;  Laterality: N/A;   INSERT / REPLACE / REMOVE PACEMAKER  01/24/2013    Medtronic Adapta L dual-chamber pacemaker, serial number [id number]    LEFT HEART CATH AND CORONARY ANGIOGRAPHY N/A 06/07/2018   Procedure: LEFT HEART CATH AND CORONARY ANGIOGRAPHY;  Surgeon: [name], MD;  Location: [facility];  Service: Cardiovascular;  Laterality: N/A;   LEFT HEART CATHETERIZATION  WITH CORONARY ANGIOGRAM N/A 03/17/2013   Procedure: LEFT HEART CATHETERIZATION WITH CORONARY ANGIOGRAM;  Surgeon: Burnell Blanks, MD; LAD mild dz, D1 branch 100%, inferior branch 99%, CFX OK, RCA 50%, EF 65%     LOOP RECORDER IMPLANT  2002   PERMANENT PACEMAKER INSERTION N/A 01/24/2013   Procedure: PERMANENT PACEMAKER INSERTION;  Surgeon: [name], MD;  Location: [facility] CATH LAB;  Service: Cardiovascular;  Laterality: N/A;   TIBIAL TUBERCLERPLASTY  ~ 2003    Home Medications:  Allergies as of 12/20/2021       Reactions   Januvia [sitagliptin] Other (See Comments)   Elevated lipase   Metformin And Related Diarrhea        Medication List        Accurate as of December 18, 2021  6:15 PM. If you have any questions, ask your nurse or doctor.          albuterol 108 (90 Base) MCG/ACT inhaler Commonly known as: VENTOLIN HFA INHALE 2 PUFFS BY MOUTH EVERY 4 HOURS AS NEEDED FOR WHEEZE OR FOR SHORTNESS OF BREATH   blood glucose meter kit and supplies Kit Dispense based on patient and insurance preference. Use up to four times daily as directed. (FOR ICD-9 250.00, 250.01).   cetirizine 10 MG tablet Commonly known as: ZYRTEC TAKE 1 TABLET BY MOUTH EVERYDAY AT BEDTIME   clopidogrel 75 MG tablet Commonly known as: PLAVIX TAKE 1 TABLET BY MOUTH EVERY DAY   diclofenac Sodium 1 % Gel Commonly known as: Voltaren Apply 2 g topically 4 (four) times daily.   empagliflozin 25 MG Tabs tablet Commonly known as: JARDIANCE Take 1 tablet (25 mg total) by mouth daily before breakfast. For diabetes.   fish oil-omega-3 fatty acids 1000 MG capsule Take 1 g by mouth daily.   fluticasone 50 MCG/ACT nasal spray Commonly known as: FLONASE PLACE 1 SPRAY INTO BOTH NOSTRILS DAILY AS NEEDED FOR ALLERGIES OR RHINITIS.   furosemide 20 MG tablet Commonly known as: LASIX TAKE 1 TABLET BY MOUTH EVERY DAY   gabapentin 300 MG capsule Commonly known as: NEURONTIN TAKE 1 CAPSULE BY MOUTH EVERY  MORNING AND 2 CAPSULES BY MOUTH AT NIGHT FOR NEUROPATHY   glipiZIDE 10 MG 24 hr tablet Commonly known as: GLUCOTROL XL TAKE 1 TABLET (10 MG TOTAL) BY MOUTH DAILY WITH BREAKFAST. FOR DIABETES.   glucose blood test strip 1 each by Other route as needed for other. Use to check blood sugar up to three times a day-Accu-Chek meter   isosorbide mononitrate 60 MG 24 hr tablet Commonly known as: IMDUR TAKE 1 TABLET BY MOUTH EVERY DAY   lisinopril 2.5 MG tablet Commonly known as: ZESTRIL TAKE 1 TABLET BY MOUTH EVERY DAY   nitroGLYCERIN 0.4 MG SL tablet Commonly known as: NITROSTAT Place 1 tablet (0.4 mg total) under the tongue every 5 (five) minutes as needed for chest pain.   pantoprazole 40 MG tablet Commonly known as: PROTONIX TAKE 1 TABLET (40 MG TOTAL) BY  MOUTH DAILY FOR HEARTBURN.   rosuvastatin 20 MG tablet Commonly known as: CRESTOR TAKE 1 TABLET BY MOUTH EVERY DAY IN THE EVENING FOR CHOLESTEROL   sertraline 50 MG tablet Commonly known as: Zoloft Take 1 tablet (50 mg total) by mouth daily. for anxiety and depression.   Symbicort 160-4.5 MCG/ACT inhaler Generic drug: budesonide-formoterol INHALE 2 PUFFS INTO THE LUNGS TWICE A DAY   tamsulosin 0.4 MG Caps capsule Commonly known as: FLOMAX TAKE 1 CAPSULE (0.4 MG TOTAL) BY MOUTH 2 (TWO) TIMES DAILY.   warfarin 5 MG tablet Commonly known as: COUMADIN Take as directed by the anticoagulation clinic. If you are unsure how to take this medication, talk to your nurse or doctor. Original instructions: TAKE 1/2 TABLET DAILY BY MOUTH EXCEPT TAKE 1 TABLET ON MONDAYS AND THURSDAYS OR AS DIRECTED BY ANTICOAGULATION CLINIC        Allergies:  Allergies  Allergen Reactions   Januvia [Sitagliptin] Other (See Comments)    Elevated lipase   Metformin And Related Diarrhea    Family History: Family History  Problem Relation Age of Onset   Alzheimer's disease Mother    Osteoporosis Mother     Social History:  reports that he  quit smoking about 7 years ago. His smoking use included cigarettes. He has a 42.00 pack-year smoking history. He has never used smokeless tobacco. He reports that he does not currently use alcohol. He reports that he does not use drugs.  ROS: Pertinent ROS in HPI  Physical Exam: There were no vitals taken for this visit.  Constitutional:  Well nourished. Alert and oriented, No acute distress. HEENT: [place] AT, moist mucus membranes.  Trachea midline Cardiovascular: No clubbing, cyanosis, or edema. Respiratory: Normal respiratory effort, no increased work of breathing. GU: No CVA tenderness.  No bladder fullness or masses.  Patient with circumcised/uncircumcised phallus. ***Foreskin easily retracted***  Urethral meatus is patent.  No penile discharge. No penile lesions or rashes. Scrotum without lesions, cysts, rashes and/or edema.  Testicles are located scrotally bilaterally. No masses are appreciated in the testicles. Left and right epididymis are normal. Rectal: Patient with  normal sphincter tone. Anus and perineum without scarring or rashes. No rectal masses are appreciated. Prostate is approximately *** grams, *** nodules are appreciated. Seminal vesicles are normal. Neurologic: Grossly intact, no focal deficits, moving all 4 extremities. Psychiatric: Normal mood and affect.   Laboratory Data:  Prostatic Specific Antigen  Latest Ref Rng 0.00 - 4.00 ng/mL  12/20/2018 <0.01   07/09/2019 0.01   01/08/2020 0.01   01/06/2021 0.02   08/05/2021 0.02     Lab Results  Component Value Date   WBC 6.9 05/27/2021   HGB 15.4 05/27/2021   HCT 44.7 05/27/2021   MCV 95.3 05/27/2021   PLT 196 05/27/2021    Lab Results  Component Value Date   CREATININE 1.06 10/26/2021   Lab Results  Component Value Date   HGBA1C 7.5 (H) 10/26/2021  I have reviewed the labs.   Pertinent Imaging: N/A   Assessment & Plan:    1. ED - insurance would not cover the IPP - scheduled for ICI titration with Dr.  Kipp Brood  2. Prostate cancer -follow up in March 2023  3. BPH with LUTS -UA benign -PVR < 300 cc -symptoms - *** -most bothersome symptoms are *** -continue conservative management, avoiding bladder irritants and timed voiding's -Initiate alpha-blocker (***), discussed side effects *** -Initiate 5 alpha reductase inhibitor (***), discussed side effects *** -Continue tamsulosin 0.4 mg  daily, alfuzosin 10 mg daily, Rapaflo 8 mg daily, terazosin, doxazosin, Cialis 5 mg daily and finasteride 5 mg daily, dutasteride 0.5 mg daily***:refills given -Cannot tolerate medication or medication failure, schedule cystoscopy ***     No follow-ups on file.  These notes generated with voice recognition software. I apologize for typographical errors.  [place], [place] [address]  [place], [place] [address] [phone]

## 2021-12-20 ENCOUNTER — Ambulatory Visit (INDEPENDENT_AMBULATORY_CARE_PROVIDER_SITE_OTHER): Payer: Medicare Other | Admitting: Urology

## 2021-12-20 ENCOUNTER — Encounter: Payer: Self-pay | Admitting: Urology

## 2021-12-20 ENCOUNTER — Other Ambulatory Visit: Payer: Self-pay

## 2021-12-20 VITALS — BP 105/66 | HR 80 | Ht 71.0 in | Wt 213.0 lb

## 2021-12-20 DIAGNOSIS — Z8546 Personal history of malignant neoplasm of prostate: Secondary | ICD-10-CM

## 2021-12-20 DIAGNOSIS — N529 Male erectile dysfunction, unspecified: Secondary | ICD-10-CM

## 2021-12-20 DIAGNOSIS — C61 Malignant neoplasm of prostate: Secondary | ICD-10-CM

## 2021-12-20 DIAGNOSIS — N138 Other obstructive and reflux uropathy: Secondary | ICD-10-CM

## 2021-12-20 DIAGNOSIS — N401 Enlarged prostate with lower urinary tract symptoms: Secondary | ICD-10-CM

## 2021-12-21 ENCOUNTER — Other Ambulatory Visit: Payer: Self-pay | Admitting: Primary Care

## 2021-12-21 DIAGNOSIS — E1165 Type 2 diabetes mellitus with hyperglycemia: Secondary | ICD-10-CM

## 2021-12-21 LAB — PSA: Prostate Specific Ag, Serum: <0.1 ng/mL (ref 0.0–4.0)

## 2021-12-22 ENCOUNTER — Telehealth: Payer: Self-pay

## 2021-12-22 NOTE — Telephone Encounter (Signed)
Ok per DPR, LMOM notifying pt. 

## 2021-12-22 NOTE — Telephone Encounter (Signed)
-----   Message from Nori Riis, PA-C sent at 12/21/2021  9:33 PM EDT ----- Please let Mr. Wirick know that his PSA was undetectable.

## 2021-12-23 ENCOUNTER — Ambulatory Visit (INDEPENDENT_AMBULATORY_CARE_PROVIDER_SITE_OTHER): Payer: Medicare Other

## 2021-12-23 DIAGNOSIS — I442 Atrioventricular block, complete: Secondary | ICD-10-CM

## 2021-12-27 LAB — CUP PACEART REMOTE DEVICE CHECK
Battery Impedance: 1188 Ohm
Battery Remaining Longevity: 58 mo
Battery Voltage: 2.77 V
Brady Statistic RV Percent Paced: 100 %
Date Time Interrogation Session: 20230908160224
Implantable Lead Implant Date: 20141010
Implantable Lead Implant Date: 20141010
Implantable Lead Location: 753859
Implantable Lead Location: 753860
Implantable Lead Model: 5076
Implantable Lead Model: 5076
Implantable Pulse Generator Implant Date: 20141010
Lead Channel Impedance Value: 561 Ohm
Lead Channel Impedance Value: 67 Ohm
Lead Channel Pacing Threshold Amplitude: 0.75 V
Lead Channel Pacing Threshold Pulse Width: 0.4 ms
Lead Channel Setting Pacing Amplitude: 2.5 V
Lead Channel Setting Pacing Pulse Width: 0.4 ms
Lead Channel Setting Sensing Sensitivity: 4 mV

## 2021-12-29 ENCOUNTER — Ambulatory Visit (INDEPENDENT_AMBULATORY_CARE_PROVIDER_SITE_OTHER): Payer: Medicare Other

## 2021-12-29 DIAGNOSIS — Z7901 Long term (current) use of anticoagulants: Secondary | ICD-10-CM | POA: Diagnosis not present

## 2021-12-29 LAB — POCT INR: INR: 2.1 (ref 2.0–3.0)

## 2021-12-29 NOTE — Patient Instructions (Addendum)
Pre visit review using our clinic review tool, if applicable. No additional management support is needed unless otherwise documented below in the visit note.   Continue 2.'5mg'$  daily except take '5mg'$  on Mondays and Thursdays. Recheck in 6 week.

## 2021-12-29 NOTE — Progress Notes (Signed)
Continue 2.'5mg'$  daily except take '5mg'$  on Mondays and Thursdays. Recheck in 6 week.

## 2022-01-03 ENCOUNTER — Telehealth: Payer: Self-pay

## 2022-01-03 NOTE — Chronic Care Management (AMB) (Signed)
Chronic Care Management Pharmacy Assistant   Name: [name] Cook  MRN: [id number] DOB: 07/31/1950  Reason for Encounter:Diabetes  Disease State   Recent office visits:  10/26/21-Katherine Clark,NP-(PCP)-[facility],Labs(Cholesterol, kidneys, liver look good.Diabetes is slightly higher A1c 7.5,) discussed screenings,vaccines, healthy diet ,exercise,change Cymbalta to Zoloft for depression. Update 1 month  Recent consult visits:  12/20/21-Shannon McGowan,PA(uro)-f/u prostate CA,insurance would not cover IPP.Referral for ICI  f/u 6 months 09/14/21-Mark Skains,MD(cardio)-f/u CAD- no medication changes  Hospital visits:  None in previous 6 months  Medications: Outpatient Encounter Medications as of 01/03/2022  Medication Sig   albuterol (VENTOLIN HFA) 108 (90 Base) MCG/ACT inhaler INHALE 2 PUFFS BY MOUTH EVERY 4 HOURS AS NEEDED FOR WHEEZE OR FOR SHORTNESS OF BREATH   blood glucose meter kit and supplies KIT Dispense based on patient and insurance preference. Use up to four times daily as directed. (FOR ICD-9 250.00, 250.01).   budesonide-formoterol (SYMBICORT) 160-4.5 MCG/ACT inhaler INHALE 2 PUFFS INTO THE LUNGS TWICE A DAY   cetirizine (ZYRTEC) 10 MG tablet TAKE 1 TABLET BY MOUTH EVERYDAY AT BEDTIME   clopidogrel (PLAVIX) 75 MG tablet TAKE 1 TABLET BY MOUTH EVERY DAY   diclofenac Sodium (VOLTAREN) 1 % GEL Apply 2 g topically 4 (four) times daily.   fish oil-omega-3 fatty acids 1000 MG capsule Take 1 g by mouth daily.     fluticasone (FLONASE) 50 MCG/ACT nasal spray PLACE 1 SPRAY INTO BOTH NOSTRILS DAILY AS NEEDED FOR ALLERGIES OR RHINITIS.   furosemide (LASIX) 20 MG tablet TAKE 1 TABLET BY MOUTH EVERY DAY   gabapentin (NEURONTIN) 300 MG capsule TAKE 1 CAPSULE BY MOUTH EVERY MORNING AND 2 CAPSULES BY MOUTH AT NIGHT FOR NEUROPATHY   glipiZIDE (GLUCOTROL XL) 10 MG 24 hr tablet TAKE 1 TABLET (10 MG TOTAL) BY MOUTH DAILY WITH BREAKFAST. FOR DIABETES.   glucose blood test strip 1 each by Other route as  needed for other. Use to check blood sugar up to three times a day-Accu-Chek meter   isosorbide mononitrate (IMDUR) 60 MG 24 hr tablet TAKE 1 TABLET BY MOUTH EVERY DAY   JARDIANCE 25 MG TABS tablet TAKE 1 TABLET (25 MG TOTAL) BY MOUTH DAILY BEFORE BREAKFAST. FOR DIABETES.   lisinopril (ZESTRIL) 2.5 MG tablet TAKE 1 TABLET BY MOUTH EVERY DAY   nitroGLYCERIN (NITROSTAT) 0.4 MG SL tablet Place 1 tablet (0.4 mg total) under the tongue every 5 (five) minutes as needed for chest pain.   pantoprazole (PROTONIX) 40 MG tablet TAKE 1 TABLET (40 MG TOTAL) BY MOUTH DAILY FOR HEARTBURN.   rosuvastatin (CRESTOR) 20 MG tablet TAKE 1 TABLET BY MOUTH EVERY DAY IN THE EVENING FOR CHOLESTEROL   sertraline (ZOLOFT) 50 MG tablet Take 1 tablet (50 mg total) by mouth daily. for anxiety and depression.   tamsulosin (FLOMAX) 0.4 MG CAPS capsule TAKE 1 CAPSULE (0.4 MG TOTAL) BY MOUTH 2 (TWO) TIMES DAILY.   warfarin (COUMADIN) 5 MG tablet TAKE 1/2 TABLET DAILY BY MOUTH EXCEPT TAKE 1 TABLET ON MONDAYS AND THURSDAYS OR AS DIRECTED BY ANTICOAGULATION CLINIC   No facility-administered encounter medications on file as of 01/03/2022.     Recent Relevant Labs: Lab Results  Component Value Date/Time   HGBA1C 7.5 (H) 10/26/2021 12:43 PM   HGBA1C 7.0 (H) 05/10/2021 09:50 AM   HGBA1C 6.4 (H) 11/27/2016 11:33 AM   HGBA1C 7.1 (H) 08/21/2016 10:25 AM   MICROALBUR <0.7 09/21/2017 01:06 PM   MICROALBUR <0.2 11/27/2016 11:33 AM    Kidney Function Lab Results  Component Value Date/Time   CREATININE 1.06 10/26/2021 12:43 PM   CREATININE 1.10 07/01/2021 08:02 AM   CREATININE 1.06 05/27/2021 03:15 PM   CREATININE 0.99 03/28/2017 02:33 PM   GFR 71.64 10/26/2021 12:43 PM   GFRNONAA >60 01/23/2020 09:36 PM   GFRNONAA 80 03/28/2017 02:33 PM   GFRAA 72 06/23/2019 11:06 AM   GFRAA 93 03/28/2017 02:33 PM     Contacted patient on 01/09/22 to discuss diabetes disease state.   Current antihyperglycemic regimen:  Jardiance 25 mg 1  tablet daily  Glipizide XL 10 mg 1 tablet daily    Patient verbally confirms he is taking the above medications as directed. Yes  What diet changes have been made to improve diabetes control? Patient states sugar free drinks and water throughout the day. Watches carb intake, eats fresh fruits.  What recent interventions/DTPs have been made to improve glycemic control:  Encouraged the patient to take BG's more often and make a log. Spoke with patient 01/06/22 for BG readings,asked for daily reading with a log, called again 01/09/22  and he had 1 BG reading    Have there been any recent hospitalizations or ED visits since last visit with CPP? No  Patient denies hypoglycemic symptoms, including Pale, Sweaty, Shaky, Hungry, Nervous/irritable, and Vision changes  Patient denies hyperglycemic symptoms, including blurry vision, excessive thirst, fatigue, polyuria, and weakness  How often are you checking your blood sugar? infrequent  What are your blood sugars ranging?  Fasting: 01/09/22  140  During the week, how often does your blood glucose drop below 70? Never  Are you checking your feet daily/regularly? Yes  Adherence Review: Is the patient currently on a STATIN medication? Yes Is the patient currently on ACE/ARB medication? Yes Does the patient have >5 day gap between last estimated fill dates? No  Care Gaps: Annual wellness visit in last year? Yes Most recent A1C reading:7.5  10/26/21 Most Recent BP reading:105/66  12/20/21  Last eye exam / retinopathy screening:2022 Last diabetic foot exam:UTD  Counseled patient on importance of annual eye and foot exam. Offered DM eye clinic appointment  Summary of recommendations from last [facility] visit (Date:09/13/21) Summary: CCM F/U visit -Pt reports mood has improved with extra dose of duloxetine (90 mg/day total) -Pt reports highest fasting BG has been about 150, he denies low sugars -   Recommendations/Changes made from today's  visit: -No med changes; follow up with coumadin clinic as scheduled  Star Rating Drugs:  Medication:  Last Fill: Day Supply Glipizide 19m 12/05/21 90 Jardiance 262m9/7/23  90 Lisinopril 2.39m139m/21/23 90 Rosuvastatin 21m56m10/23 90   PCP appointment on 04/28/2022 and CCM appointment on 03/14/22  LindCharlene BrookeP notified  [place] [phone]

## 2022-01-06 ENCOUNTER — Other Ambulatory Visit: Payer: Self-pay | Admitting: Internal Medicine

## 2022-01-06 ENCOUNTER — Other Ambulatory Visit: Payer: Self-pay | Admitting: Primary Care

## 2022-01-06 DIAGNOSIS — F339 Major depressive disorder, recurrent, unspecified: Secondary | ICD-10-CM

## 2022-01-06 DIAGNOSIS — Z7901 Long term (current) use of anticoagulants: Secondary | ICD-10-CM

## 2022-01-06 DIAGNOSIS — E119 Type 2 diabetes mellitus without complications: Secondary | ICD-10-CM

## 2022-01-06 DIAGNOSIS — J439 Emphysema, unspecified: Secondary | ICD-10-CM

## 2022-01-06 DIAGNOSIS — F3341 Major depressive disorder, recurrent, in partial remission: Secondary | ICD-10-CM

## 2022-01-06 NOTE — Progress Notes (Signed)
Remote pacemaker transmission.   

## 2022-01-11 ENCOUNTER — Telehealth: Payer: Self-pay

## 2022-01-11 NOTE — Patient Outreach (Signed)
  Care Coordination   Initial Visit Note   01/11/2022 Name: [name] Cook MRN: [id number] DOB: [birth year]  [name] Cook is a 71 y.o. year old male who sees [name], NP for primary care. I spoke with  [name] by phone today.  What matters to the patients health and wellness today?  Patient states he does not have any nursing or community resource needs at this time.     Goals Addressed             This Visit's Progress    COMPLETED: Care coordination activities- no follow up needed       Care Coordination Interventions: Care coordination program/ services discussed  Social determinants of health survey completed Patient advised to contact primary care provider office  if care coordination services needed in the future           SDOH assessments and interventions completed:  Yes  SDOH Interventions Today    Flowsheet Row Most Recent Value  SDOH Interventions   Food Insecurity Interventions Intervention Not Indicated  Housing Interventions Intervention Not Indicated  Transportation Interventions Intervention Not Indicated        Care Coordination Interventions Activated:  Yes  Care Coordination Interventions:  Yes, provided   Follow up plan: No further intervention required.   Encounter Outcome:  Pt. Visit Completed   Quinn Plowman RN,BSN,CCM [place] [phone] direct line

## 2022-02-02 ENCOUNTER — Other Ambulatory Visit: Payer: Self-pay | Admitting: Primary Care

## 2022-02-02 DIAGNOSIS — J439 Emphysema, unspecified: Secondary | ICD-10-CM

## 2022-02-02 NOTE — Telephone Encounter (Signed)
Unable to reach patient. Left voicemail to return call to our office.  Need to let him know this was just called in 01/06/22.

## 2022-02-09 ENCOUNTER — Ambulatory Visit (INDEPENDENT_AMBULATORY_CARE_PROVIDER_SITE_OTHER): Payer: Medicare Other

## 2022-02-09 DIAGNOSIS — Z7901 Long term (current) use of anticoagulants: Secondary | ICD-10-CM

## 2022-02-09 LAB — POCT INR: INR: 2.2 (ref 2.0–3.0)

## 2022-02-09 NOTE — Progress Notes (Signed)
Continue 2.'5mg'$  daily except take '5mg'$  on Mondays and Thursdays. Recheck in 6 weeks.

## 2022-02-09 NOTE — Patient Instructions (Signed)
Continue 2.'5mg'$  daily except take '5mg'$  on Mondays and Thursdays. Recheck in 6 weeks, on 03/23/22 at 1:30.

## 2022-02-27 ENCOUNTER — Encounter (HOSPITAL_COMMUNITY): Payer: Self-pay

## 2022-02-27 ENCOUNTER — Inpatient Hospital Stay (HOSPITAL_COMMUNITY)
Admission: EM | Admit: 2022-02-27 | Discharge: 2022-03-03 | DRG: 378 | Disposition: A | Discharge status: Home / Self Care | Payer: Medicare Other | Attending: Internal Medicine | Admitting: Internal Medicine

## 2022-02-27 ENCOUNTER — Emergency Department (HOSPITAL_COMMUNITY): Payer: Medicare Other

## 2022-02-27 ENCOUNTER — Other Ambulatory Visit: Payer: Self-pay

## 2022-02-27 DIAGNOSIS — I7143 Infrarenal abdominal aortic aneurysm, without rupture: Secondary | ICD-10-CM | POA: Diagnosis present

## 2022-02-27 DIAGNOSIS — N182 Chronic kidney disease, stage 2 (mild): Secondary | ICD-10-CM | POA: Diagnosis not present

## 2022-02-27 DIAGNOSIS — K2901 Acute gastritis with bleeding: Secondary | ICD-10-CM | POA: Diagnosis not present

## 2022-02-27 DIAGNOSIS — I2510 Atherosclerotic heart disease of native coronary artery without angina pectoris: Secondary | ICD-10-CM | POA: Diagnosis present

## 2022-02-27 DIAGNOSIS — K5900 Constipation, unspecified: Secondary | ICD-10-CM | POA: Diagnosis not present

## 2022-02-27 DIAGNOSIS — R58 Hemorrhage, not elsewhere classified: Secondary | ICD-10-CM | POA: Diagnosis not present

## 2022-02-27 DIAGNOSIS — K635 Polyp of colon: Secondary | ICD-10-CM | POA: Diagnosis not present

## 2022-02-27 DIAGNOSIS — K8020 Calculus of gallbladder without cholecystitis without obstruction: Secondary | ICD-10-CM | POA: Diagnosis present

## 2022-02-27 DIAGNOSIS — Z560 Unemployment, unspecified: Secondary | ICD-10-CM | POA: Diagnosis not present

## 2022-02-27 DIAGNOSIS — E1122 Type 2 diabetes mellitus with diabetic chronic kidney disease: Secondary | ICD-10-CM | POA: Diagnosis not present

## 2022-02-27 DIAGNOSIS — R404 Transient alteration of awareness: Secondary | ICD-10-CM | POA: Diagnosis not present

## 2022-02-27 DIAGNOSIS — Z7901 Long term (current) use of anticoagulants: Secondary | ICD-10-CM

## 2022-02-27 DIAGNOSIS — E860 Dehydration: Secondary | ICD-10-CM | POA: Diagnosis not present

## 2022-02-27 DIAGNOSIS — Z7951 Long term (current) use of inhaled steroids: Secondary | ICD-10-CM

## 2022-02-27 DIAGNOSIS — E1165 Type 2 diabetes mellitus with hyperglycemia: Secondary | ICD-10-CM | POA: Diagnosis not present

## 2022-02-27 DIAGNOSIS — E785 Hyperlipidemia, unspecified: Secondary | ICD-10-CM | POA: Diagnosis not present

## 2022-02-27 DIAGNOSIS — Z955 Presence of coronary angioplasty implant and graft: Secondary | ICD-10-CM

## 2022-02-27 DIAGNOSIS — K449 Diaphragmatic hernia without obstruction or gangrene: Secondary | ICD-10-CM | POA: Diagnosis present

## 2022-02-27 DIAGNOSIS — G4733 Obstructive sleep apnea (adult) (pediatric): Secondary | ICD-10-CM | POA: Diagnosis present

## 2022-02-27 DIAGNOSIS — Z87891 Personal history of nicotine dependence: Secondary | ICD-10-CM | POA: Diagnosis not present

## 2022-02-27 DIAGNOSIS — I4819 Other persistent atrial fibrillation: Secondary | ICD-10-CM | POA: Diagnosis present

## 2022-02-27 DIAGNOSIS — D6832 Hemorrhagic disorder due to extrinsic circulating anticoagulants: Secondary | ICD-10-CM | POA: Diagnosis present

## 2022-02-27 DIAGNOSIS — I499 Cardiac arrhythmia, unspecified: Secondary | ICD-10-CM | POA: Diagnosis not present

## 2022-02-27 DIAGNOSIS — I4821 Permanent atrial fibrillation: Secondary | ICD-10-CM | POA: Diagnosis not present

## 2022-02-27 DIAGNOSIS — K921 Melena: Secondary | ICD-10-CM | POA: Diagnosis present

## 2022-02-27 DIAGNOSIS — Z950 Presence of cardiac pacemaker: Secondary | ICD-10-CM

## 2022-02-27 DIAGNOSIS — Z79899 Other long term (current) drug therapy: Secondary | ICD-10-CM | POA: Diagnosis not present

## 2022-02-27 DIAGNOSIS — D62 Acute posthemorrhagic anemia: Secondary | ICD-10-CM | POA: Diagnosis not present

## 2022-02-27 DIAGNOSIS — R6889 Other general symptoms and signs: Secondary | ICD-10-CM | POA: Diagnosis not present

## 2022-02-27 DIAGNOSIS — K219 Gastro-esophageal reflux disease without esophagitis: Secondary | ICD-10-CM | POA: Diagnosis not present

## 2022-02-27 DIAGNOSIS — K5730 Diverticulosis of large intestine without perforation or abscess without bleeding: Secondary | ICD-10-CM | POA: Diagnosis present

## 2022-02-27 DIAGNOSIS — Z7902 Long term (current) use of antithrombotics/antiplatelets: Secondary | ICD-10-CM

## 2022-02-27 DIAGNOSIS — Z6829 Body mass index (BMI) 29.0-29.9, adult: Secondary | ICD-10-CM

## 2022-02-27 DIAGNOSIS — M1990 Unspecified osteoarthritis, unspecified site: Secondary | ICD-10-CM | POA: Diagnosis present

## 2022-02-27 DIAGNOSIS — E669 Obesity, unspecified: Secondary | ICD-10-CM | POA: Diagnosis present

## 2022-02-27 DIAGNOSIS — Z7984 Long term (current) use of oral hypoglycemic drugs: Secondary | ICD-10-CM

## 2022-02-27 DIAGNOSIS — I10 Essential (primary) hypertension: Secondary | ICD-10-CM | POA: Diagnosis present

## 2022-02-27 DIAGNOSIS — K259 Gastric ulcer, unspecified as acute or chronic, without hemorrhage or perforation: Secondary | ICD-10-CM | POA: Diagnosis not present

## 2022-02-27 DIAGNOSIS — T45515A Adverse effect of anticoagulants, initial encounter: Secondary | ICD-10-CM | POA: Diagnosis present

## 2022-02-27 DIAGNOSIS — K922 Gastrointestinal hemorrhage, unspecified: Secondary | ICD-10-CM

## 2022-02-27 DIAGNOSIS — R42 Dizziness and giddiness: Secondary | ICD-10-CM | POA: Diagnosis not present

## 2022-02-27 DIAGNOSIS — J449 Chronic obstructive pulmonary disease, unspecified: Secondary | ICD-10-CM | POA: Diagnosis present

## 2022-02-27 DIAGNOSIS — Z888 Allergy status to other drugs, medicaments and biological substances status: Secondary | ICD-10-CM

## 2022-02-27 DIAGNOSIS — I252 Old myocardial infarction: Secondary | ICD-10-CM | POA: Diagnosis not present

## 2022-02-27 DIAGNOSIS — I129 Hypertensive chronic kidney disease with stage 1 through stage 4 chronic kidney disease, or unspecified chronic kidney disease: Secondary | ICD-10-CM | POA: Diagnosis present

## 2022-02-27 DIAGNOSIS — Z743 Need for continuous supervision: Secondary | ICD-10-CM | POA: Diagnosis not present

## 2022-02-27 LAB — LACTIC ACID, PLASMA
Lactic Acid, Venous: 3.3 mmol/L (ref 0.5–1.9)
Lactic Acid, Venous: 3 mmol/L (ref 0.5–1.9)

## 2022-02-27 LAB — COMPREHENSIVE METABOLIC PANEL
ALT: 13 U/L (ref 0–44)
AST: 14 U/L — ABNORMAL LOW (ref 15–41)
Albumin: 3.3 g/dL — ABNORMAL LOW (ref 3.5–5.0)
Alkaline Phosphatase: 33 U/L — ABNORMAL LOW (ref 38–126)
Anion gap: 10 (ref 5–15)
BUN: 75 mg/dL — ABNORMAL HIGH (ref 8–23)
CO2: 16 mmol/L — ABNORMAL LOW (ref 22–32)
Calcium: 9 mg/dL (ref 8.9–10.3)
Chloride: 112 mmol/L — ABNORMAL HIGH (ref 98–111)
Creatinine, Ser: 1.08 mg/dL (ref 0.61–1.24)
GFR, Estimated: >60 mL/min (ref >60)
Glucose, Bld: 261 mg/dL — ABNORMAL HIGH (ref 70–99)
Potassium: 5 mmol/L (ref 3.5–5.1)
Sodium: 138 mmol/L (ref 135–145)
Total Bilirubin: 0.8 mg/dL (ref 0.3–1.2)
Total Protein: 5.3 g/dL — ABNORMAL LOW (ref 6.5–8.1)

## 2022-02-27 LAB — PROTIME-INR
INR: 2.8 — ABNORMAL HIGH (ref 0.8–1.2)
Prothrombin Time: 28.9 seconds — ABNORMAL HIGH (ref 11.4–15.2)

## 2022-02-27 LAB — CBC WITH DIFFERENTIAL/PLATELET
Abs Immature Granulocytes: 0.07 10*3/uL (ref 0.00–0.07)
Basophils Absolute: 0 10*3/uL (ref 0.0–0.1)
Basophils Relative: 0 %
Eosinophils Absolute: 0 10*3/uL (ref 0.0–0.5)
Eosinophils Relative: 0 %
HCT: 30.3 % — ABNORMAL LOW (ref 39.0–52.0)
Hemoglobin: 9.7 g/dL — ABNORMAL LOW (ref 13.0–17.0)
Immature Granulocytes: 1 %
Lymphocytes Relative: 9 %
Lymphs Abs: 1.1 10*3/uL (ref 0.7–4.0)
MCH: 32.6 pg (ref 26.0–34.0)
MCHC: 32 g/dL (ref 30.0–36.0)
MCV: 101.7 fL — ABNORMAL HIGH (ref 80.0–100.0)
Monocytes Absolute: 0.6 10*3/uL (ref 0.1–1.0)
Monocytes Relative: 5 %
Neutro Abs: 10 10*3/uL — ABNORMAL HIGH (ref 1.7–7.7)
Neutrophils Relative %: 85 %
Platelets: 148 10*3/uL — ABNORMAL LOW (ref 150–400)
RBC: 2.98 MIL/uL — ABNORMAL LOW (ref 4.22–5.81)
RDW: 13.9 % (ref 11.5–15.5)
WBC: 11.8 10*3/uL — ABNORMAL HIGH (ref 4.0–10.5)
nRBC: 0 % (ref 0.0–0.2)

## 2022-02-27 LAB — HEMOGLOBIN AND HEMATOCRIT, BLOOD
HCT: 28.6 % — ABNORMAL LOW (ref 39.0–52.0)
Hemoglobin: 9.2 g/dL — ABNORMAL LOW (ref 13.0–17.0)

## 2022-02-27 LAB — APTT: aPTT: 29 seconds (ref 24–36)

## 2022-02-27 LAB — GLUCOSE, CAPILLARY: Glucose-Capillary: 208 mg/dL — ABNORMAL HIGH (ref 70–99)

## 2022-02-27 LAB — ABO/RH: ABO/RH(D): O POS

## 2022-02-27 LAB — POC OCCULT BLOOD, ED: Fecal Occult Bld: POSITIVE — AB

## 2022-02-27 LAB — PREPARE RBC (CROSSMATCH)

## 2022-02-27 MED ORDER — SERTRALINE HCL 50 MG PO TABS
50.0000 mg | ORAL_TABLET | Freq: Every day | ORAL | Status: DC
  Administered 2022-02-28 – 2022-03-03 (×3): 50 mg via ORAL
  Filled 2022-02-27 (×3): qty 1

## 2022-02-27 MED ORDER — SODIUM CHLORIDE 0.9 % IV SOLN: INTRAVENOUS | Status: DC

## 2022-02-27 MED ORDER — INSULIN ASPART 100 UNIT/ML IJ SOLN
0.0000 [IU] | Freq: Three times a day (TID) | INTRAMUSCULAR | Status: DC
  Administered 2022-02-28: 3 [IU] via SUBCUTANEOUS
  Administered 2022-02-28: 2 [IU] via SUBCUTANEOUS
  Administered 2022-02-28 – 2022-03-01 (×3): 1 [IU] via SUBCUTANEOUS
  Administered 2022-03-02: 3 [IU] via SUBCUTANEOUS
  Administered 2022-03-02: 2 [IU] via SUBCUTANEOUS
  Administered 2022-03-02: 3 [IU] via SUBCUTANEOUS
  Administered 2022-03-03: 2 [IU] via SUBCUTANEOUS
  Filled 2022-02-27: qty 0.09

## 2022-02-27 MED ORDER — VITAMIN K1 10 MG/ML IJ SOLN
10.0000 mg | Freq: Once | INTRAVENOUS | Status: AC
  Administered 2022-02-27: 10 mg via INTRAVENOUS
  Filled 2022-02-27: qty 1

## 2022-02-27 MED ORDER — SERTRALINE HCL 50 MG PO TABS: 50.0000 mg | ORAL_TABLET | Freq: Every day | ORAL | Status: DC

## 2022-02-27 MED ORDER — PANTOPRAZOLE SODIUM 40 MG IV SOLR
40.0000 mg | Freq: Two times a day (BID) | INTRAVENOUS | Status: DC
  Administered 2022-02-27 – 2022-03-01 (×4): 40 mg via INTRAVENOUS
  Filled 2022-02-27 (×4): qty 10

## 2022-02-27 MED ORDER — ALBUTEROL SULFATE HFA 108 (90 BASE) MCG/ACT IN AERS: 2.0000 | INHALATION_SPRAY | RESPIRATORY_TRACT | Status: DC | PRN

## 2022-02-27 MED ORDER — ONDANSETRON HCL 4 MG/2ML IJ SOLN: 4.0000 mg | Freq: Four times a day (QID) | INTRAMUSCULAR | Status: DC | PRN

## 2022-02-27 MED ORDER — SODIUM CHLORIDE 0.9 % IV SOLN
10.0000 mL/h | Freq: Once | INTRAVENOUS | Status: AC
  Administered 2022-02-27: 10 mL/h via INTRAVENOUS

## 2022-02-27 MED ORDER — ACETAMINOPHEN 325 MG PO TABS
650.0000 mg | ORAL_TABLET | Freq: Four times a day (QID) | ORAL | Status: DC | PRN
  Administered 2022-02-27 – 2022-02-28 (×2): 650 mg via ORAL
  Filled 2022-02-27 (×2): qty 2

## 2022-02-27 MED ORDER — GABAPENTIN 300 MG PO CAPS: 300.0000 mg | ORAL_CAPSULE | Freq: Two times a day (BID) | ORAL | Status: DC

## 2022-02-27 MED ORDER — ACETAMINOPHEN 650 MG RE SUPP: 650.0000 mg | Freq: Four times a day (QID) | RECTAL | Status: DC | PRN

## 2022-02-27 MED ORDER — INSULIN ASPART 100 UNIT/ML IJ SOLN
0.0000 [IU] | Freq: Every day | INTRAMUSCULAR | Status: DC
  Administered 2022-02-27 – 2022-03-01 (×2): 2 [IU] via SUBCUTANEOUS
  Filled 2022-02-27: qty 0.05

## 2022-02-27 MED ORDER — IOHEXOL 350 MG/ML SOLN
100.0000 mL | Freq: Once | INTRAVENOUS | Status: AC | PRN
  Administered 2022-02-27: 100 mL via INTRAVENOUS

## 2022-02-27 MED ORDER — PANTOPRAZOLE SODIUM 40 MG IV SOLR
40.0000 mg | Freq: Once | INTRAVENOUS | Status: AC
  Administered 2022-02-27: 40 mg via INTRAVENOUS
  Filled 2022-02-27: qty 10

## 2022-02-27 MED ORDER — SODIUM CHLORIDE 0.9 % IV BOLUS
2000.0000 mL | Freq: Once | INTRAVENOUS | Status: AC
  Administered 2022-02-27: 2000 mL via INTRAVENOUS

## 2022-02-27 MED ORDER — ONDANSETRON HCL 4 MG PO TABS
4.0000 mg | ORAL_TABLET | Freq: Four times a day (QID) | ORAL | Status: DC | PRN
  Administered 2022-02-28: 4 mg via ORAL
  Filled 2022-02-27 (×2): qty 1

## 2022-02-27 MED ORDER — GABAPENTIN 300 MG PO CAPS
600.0000 mg | ORAL_CAPSULE | Freq: Every day | ORAL | Status: DC
  Administered 2022-02-27 – 2022-03-02 (×4): 600 mg via ORAL
  Filled 2022-02-27 (×4): qty 2

## 2022-02-27 MED ORDER — ALBUTEROL SULFATE (2.5 MG/3ML) 0.083% IN NEBU: 2.5000 mg | INHALATION_SOLUTION | RESPIRATORY_TRACT | Status: DC | PRN

## 2022-02-27 MED ORDER — GABAPENTIN 300 MG PO CAPS
300.0000 mg | ORAL_CAPSULE | Freq: Every day | ORAL | Status: DC
  Administered 2022-02-28 – 2022-03-03 (×3): 300 mg via ORAL
  Filled 2022-02-27 (×4): qty 1

## 2022-02-27 MED ORDER — MOMETASONE FURO-FORMOTEROL FUM 200-5 MCG/ACT IN AERO
2.0000 | INHALATION_SPRAY | Freq: Two times a day (BID) | RESPIRATORY_TRACT | Status: DC
  Administered 2022-02-28 – 2022-03-03 (×7): 2 via RESPIRATORY_TRACT
  Filled 2022-02-27: qty 8.8

## 2022-02-27 NOTE — Progress Notes (Signed)
Patient's hospital computer chart reviewed as well as previous GI work-up and case discussed with ER physician and CTA reviewed negative for active bleeding so agree with holding Plavix and Coumadin and using pump inhibitors and clear liquid diet okay please call us if GI question or problem this evening otherwise we will see tomorrow in the a.m.

## 2022-02-27 NOTE — ED Triage Notes (Signed)
BIBA for dark stool x3 days with 4-5 episodes a day, dizziness, and rigid abd. Per ems grey on arrival with bp 77S systolic and 14% on RA with sob.  Pt denies n/v/ chest pain.  Hx diverticulosis, A-fib, DM and pacemaker noted.  Pt on coumadin and Plavix.

## 2022-02-27 NOTE — ED Notes (Signed)
Blood consent signed

## 2022-02-27 NOTE — H&P (Signed)
History and Physical    [name] Cook EYC:[id number] DOB: [birth year] DOA: 02/27/2022  PCP: [name], NP  Patient coming from: Home  I have personally briefly reviewed patient's old medical records available.   Chief Complaint: Dizziness and black tarry stool for 2 days.  HPI: [name] Cook is a 71 y.o. male with medical history significant of diverticulosis, persistent A-fib status post pacemaker on anticoagulation with Coumadin, also takes Plavix with history of coronary artery disease and last stent being more than 5 years ago, essential hypertension, type 2 diabetes on oral hypoglycemics who started feeling lower abdominal discomfort that is mild, dull achy and also noticed his stools were darker and black in color.  For last few days he continued to feel weaker, dizzy every time he attempts to stand up.  Occasional shortness of breath associated with it.  Abdominal pain has improved by today.  His stool is still darker.  Denies any history of GI bleeding.  He however had a screening colonoscopy on 2/23 that showed diverticulosis without any active bleeding.  Denies any use of NSAIDs.  ED Course: EMS reported blood pressure 70 at home.  In the emergency room on supine blood pressure is 100/52.  Heart rate is normal.  Hemoglobin 9.7 with recent hemoglobin of 15.  Underwent emergent CT angiogram with GI bleeding protocol and was negative for active extravasation.  INR is 2.8 on Coumadin.  BUN is elevated.  Patient was started on 1 unit transfusion for symptomatic anemia, started on Protonix, GI consulted and admission requested.  Previous Echocardiogram with normal ejection fraction. Patient was started on blood transfusion. He looks clinically dehydrated, will give 2 L isotonic fluid bolus for volume expansion.  Review of Systems: Patient still feels dry mouth.  He tells me that he will get dizzy if he attempts to sit.   Past Medical History:  Diagnosis Date   Acute bronchitis with  COPD ([place]) 03/07/2021   Arthritis    "knees and lower back" (03/14/2013)   Atrial flutter ([place])    radiofrequency ablation in 2001   CAD (coronary artery disease)    a. Nonobstructive. Cardiac cath in 2001-50% mid RI, normal LM, LAD, RCA b. cath 10/16/2014 95% mid RCA treated with DES, 99% ost D1 medical management due to small aneurysmal segment   Chronic anticoagulation    chronic Coumadin anticoagulation   Chronic obstructive pulmonary disease ([place]) 04/20/2011   Diabetes mellitus, type 2 (HCC)    Elevated lipase 06/23/2021   GERD (gastroesophageal reflux disease)    Hyperlipidemia    Hypertension    with hypertensive heart disease   Left knee pain 10/25/2017   medial   Obesity    Persistent atrial fibrillation ([place])    recurrent atrial flutter since 2001 s/p DCCVs, multiple failed AADs, h/o tachy-mediated cardiomyopathy   Shortness of breath    "can come on at any time" (03/14/2013)   Sleep apnea    "dx'd; couldn't wear the mask" (03/14/2013)   Tobacco abuse     Past Surgical History:  Procedure Laterality Date   ATRIAL FLUTTER ABLATION  2002   atrial flutter; subsequently developed atrial fibrillation   AV NODE ABLATION  01/24/2013   CARDIAC CATHETERIZATION  2002   CARDIAC CATHETERIZATION N/A 10/16/2014   Procedure: Left Heart Cath and Coronary Angiography;  Surgeon: [name], MD;  Location: [place] [facility];  Service: Cardiovascular;  Laterality: N/A;   CARDIOVERSION  05/31/2011   Procedure: CARDIOVERSION;  Surgeon: Cristopher Estimable.  Lattie Haw, MD;  Location: [facility];  Service: Cardiovascular;  Laterality: N/A;   CARPAL TUNNEL RELEASE Left 1980's   COLONOSCOPY WITH PROPOFOL N/A 12/30/2018   Procedure: COLONOSCOPY WITH PROPOFOL;  Surgeon: Jonathon Bellows, MD;  Location: [facility] ENDOSCOPY;  Service: Gastroenterology;  Laterality: N/A;   COLONOSCOPY WITH PROPOFOL N/A 12/04/2019   Procedure: COLONOSCOPY WITH PROPOFOL;  Surgeon: Jonathon Bellows, MD;  Location: [facility] ENDOSCOPY;  Service:  Gastroenterology;  Laterality: N/A;   COLONOSCOPY WITH PROPOFOL N/A 06/13/2021   Procedure: COLONOSCOPY WITH PROPOFOL;  Surgeon: Jonathon Bellows, MD;  Location: [facility] ENDOSCOPY;  Service: Gastroenterology;  Laterality: N/A;   INSERT / REPLACE / REMOVE PACEMAKER  01/24/2013    Medtronic Adapta L dual-chamber pacemaker, serial number [id number]    LEFT HEART CATH AND CORONARY ANGIOGRAPHY N/A 06/07/2018   Procedure: LEFT HEART CATH AND CORONARY ANGIOGRAPHY;  Surgeon: [name], MD;  Location: [place] [facility];  Service: Cardiovascular;  Laterality: N/A;   LEFT HEART CATHETERIZATION WITH CORONARY ANGIOGRAM N/A 03/17/2013   Procedure: LEFT HEART CATHETERIZATION WITH CORONARY ANGIOGRAM;  Surgeon: Burnell Blanks, MD; LAD mild dz, D1 branch 100%, inferior branch 99%, CFX OK, RCA 50%, EF 65%     LOOP RECORDER IMPLANT  2002   PERMANENT PACEMAKER INSERTION N/A 01/24/2013   Procedure: PERMANENT PACEMAKER INSERTION;  Surgeon: [name], MD;  Location: [facility] CATH LAB;  Service: Cardiovascular;  Laterality: N/A;   TIBIAL TUBERCLERPLASTY  ~ 2003    Social history   reports that he quit smoking about 8 years ago. His smoking use included cigarettes. He has a 42.00 pack-year smoking history. He has never used smokeless tobacco. He reports that he does not currently use alcohol. He reports that he does not use drugs.  Allergies  Allergen Reactions   Januvia [Sitagliptin] Other (See Comments)    Elevated lipase   Metformin And Related Diarrhea    Family History  Problem Relation Age of Onset   Alzheimer's disease Mother    Osteoporosis Mother      Prior to Admission medications   Medication Sig Start Date End Date Taking? Authorizing Provider  DULoxetine (CYMBALTA) 30 MG capsule Take 30 mg by mouth daily. 01/21/22  Yes [provider]  albuterol (VENTOLIN HFA) 108 (90 Base) MCG/ACT inhaler INHALE 2 PUFFS BY MOUTH EVERY 4 HOURS AS NEEDED FOR WHEEZE OR FOR SHORTNESS OF BREATH Patient  taking differently: Inhale 2 puffs into the lungs every 4 (four) hours as needed for wheezing or shortness of breath. 01/06/22   [name], NP  blood glucose meter kit and supplies KIT Dispense based on patient and insurance preference. Use up to four times daily as directed. (FOR ICD-9 250.00, 250.01). 04/08/21   [name], NP  budesonide-formoterol [facility]) 160-4.5 MCG/ACT inhaler INHALE 2 PUFFS INTO THE LUNGS TWICE A DAY 01/12/21   [name], NP  cetirizine (ZYRTEC) 10 MG tablet TAKE 1 TABLET BY MOUTH EVERYDAY AT BEDTIME 09/26/21   Dutch Quint B, FNP  clopidogrel (PLAVIX) 75 MG tablet TAKE 1 TABLET BY MOUTH EVERY DAY Patient taking differently: Take 75 mg by mouth daily. 08/29/21   [name], MD  diclofenac Sodium (VOLTAREN) 1 % GEL Apply 2 g topically 4 (four) times daily. 07/17/19   Khatri, Hina, PA-C  fish oil-omega-3 fatty acids 1000 MG capsule Take 1 g by mouth daily.      [provider]  fluticasone (FLONASE) 50 MCG/ACT nasal spray PLACE 1 SPRAY INTO BOTH NOSTRILS DAILY  AS NEEDED FOR ALLERGIES OR RHINITIS. 06/23/21   [name], NP  furosemide (LASIX) 20 MG tablet TAKE 1 TABLET BY MOUTH EVERY DAY 08/29/21   [name], MD  gabapentin (NEURONTIN) 300 MG capsule TAKE 1 CAPSULE BY MOUTH EVERY MORNING AND 2 CAPSULES BY MOUTH AT NIGHT FOR NEUROPATHY 11/18/21   [name], NP  glipiZIDE (GLUCOTROL XL) 10 MG 24 hr tablet TAKE 1 TABLET (10 MG TOTAL) BY MOUTH DAILY WITH BREAKFAST. FOR DIABETES. 12/05/21   [name], NP  glucose blood test strip 1 each by Other route as needed for other. Use to check blood sugar up to three times a day-Accu-Chek meter    [provider]  isosorbide mononitrate (IMDUR) 60 MG 24 hr tablet TAKE 1 TABLET BY MOUTH EVERY DAY 01/06/22   [name], MD  JARDIANCE 25 MG TABS tablet TAKE 1 TABLET (25 MG TOTAL) BY MOUTH DAILY BEFORE BREAKFAST. FOR DIABETES. 12/22/21   [name], NP  lisinopril  (ZESTRIL) 2.5 MG tablet TAKE 1 TABLET BY MOUTH EVERY DAY 07/11/21   [name], MD  nitroGLYCERIN (NITROSTAT) 0.4 MG SL tablet Place 1 tablet (0.4 mg total) under the tongue every 5 (five) minutes as needed for chest pain. 04/08/21   [name], NP  pantoprazole (PROTONIX) 40 MG tablet TAKE 1 TABLET (40 MG TOTAL) BY MOUTH DAILY FOR HEARTBURN. 10/26/20   [name], NP  rosuvastatin (CRESTOR) 20 MG tablet TAKE 1 TABLET BY MOUTH EVERY DAY IN THE EVENING FOR CHOLESTEROL 11/24/21   [name], NP  sertraline (ZOLOFT) 50 MG tablet TAKE 1 TABLET (50 MG TOTAL) BY MOUTH DAILY. FOR ANXIETY AND DEPRESSION. 01/06/22   [name], NP  tamsulosin (FLOMAX) 0.4 MG CAPS capsule TAKE 1 CAPSULE (0.4 MG TOTAL) BY MOUTH 2 (TWO) TIMES DAILY. 01/12/21   Noreene Filbert, MD  warfarin (COUMADIN) 5 MG tablet TAKE 1/2 TABLET DAILY BY MOUTH EXCEPT TAKE 1 TABLET ON MONDAYS AND THURSDAYS OR AS DIRECTED BY ANTICOAGULATION CLINIC 01/06/22   [name], NP    Physical Exam: Vitals:   02/27/22 1605 02/27/22 1615 02/27/22 1715 02/27/22 1737  BP: 113/60 (!) 104/55 110/60 (!) 123/57  Pulse: 74 70 71 70  Resp: _0 Temp:   98.2 F (36.8 C) 98.2 F (36.8 C)  TempSrc:    Oral  SpO2: 100% 100% 100% 100%  Weight:      Height:        Constitutional: NAD, calm, comfortable, appropriately anxious. Vitals:   02/27/22 1605 02/27/22 1615 02/27/22 1715 02/27/22 1737  BP: 113/60 (!) 104/55 110/60 (!) 123/57  Pulse: 74 70 71 70  Resp: _1 Temp:   98.2 F (36.8 C) 98.2 F (36.8 C)  TempSrc:    Oral  SpO2: 100% 100% 100% 100%  Weight:      Height:       Eyes: PERRL, lids and conjunctivae normal.  Pale. ENMT: Mucous membranes are dry.  Posterior pharynx clear of any exudate or lesions.Normal dentition.  Neck: normal, supple, no masses, no thyromegaly Respiratory: clear to auscultation bilaterally, no wheezing, no crackles. Normal respiratory effort. No accessory muscle  use.  Cardiovascular: Regular rate and rhythm, no murmurs / rubs / gallops. No extremity edema. 2+ pedal pulses. No carotid bruits.  Pacemaker left precordium Abdomen: no tenderness, no masses palpated. No hepatosplenomegaly. Bowel sounds positive.  Musculoskeletal: no clubbing / cyanosis. No joint  deformity upper and lower extremities. Good ROM, no contractures. Normal muscle tone.  Skin: no rashes, lesions, ulcers. No induration Neurologic: CN 2-12 grossly intact. Sensation intact, DTR normal. Strength 5/5 in all 4.  Psychiatric: Normal judgment and insight. Alert and oriented x 3. Normal mood.     Labs on Admission: I have personally reviewed following labs and imaging studies  CBC: Recent Labs  Lab 02/27/22 1451  WBC 11.8*  NEUTROABS 10.0*  HGB 9.7*  HCT 30.3*  MCV 101.7*  PLT 938*   Basic Metabolic Panel: Recent Labs  Lab 02/27/22 1451  NA 138  K 5.0  CL 112*  CO2 16*  GLUCOSE 261*  BUN 75*  CREATININE 1.08  CALCIUM 9.0   GFR: Estimated Creatinine Clearance: 76.3 mL/min (by C-G formula based on SCr of 1.08 mg/dL). Liver Function Tests: Recent Labs  Lab 02/27/22 1451  AST 14*  ALT 13  ALKPHOS 33*  BILITOT 0.8  PROT 5.3*  ALBUMIN 3.3*   No results for input(s): "LIPASE", "AMYLASE" in the last 168 hours. No results for input(s): "AMMONIA" in the last 168 hours. Coagulation Profile: Recent Labs  Lab 02/27/22 1451  INR 2.8*   Cardiac Enzymes: No results for input(s): "CKTOTAL", "CKMB", "CKMBINDEX", "TROPONINI" in the last 168 hours. BNP (last 3 results) No results for input(s): "PROBNP" in the last 8760 hours. HbA1C: No results for input(s): "HGBA1C" in the last 72 hours. CBG: No results for input(s): "GLUCAP" in the last 168 hours. Lipid Profile: No results for input(s): "CHOL", "HDL", "LDLCALC", "TRIG", "CHOLHDL", "LDLDIRECT" in the last 72 hours. Thyroid Function Tests: No results for input(s): "TSH", "T4TOTAL", "FREET4", "T3FREE", "THYROIDAB" in  the last 72 hours. Anemia Panel: No results for input(s): "VITAMINB12", "FOLATE", "FERRITIN", "TIBC", "IRON", "RETICCTPCT" in the last 72 hours. Urine analysis:    Component Value Date/Time   COLORURINE YELLOW (A) 01/23/2020 2136   APPEARANCEUR CLEAR (A) 01/23/2020 2136   APPEARANCEUR Clear 11/20/2017 1026   LABSPEC 1.024 01/23/2020 2136   LABSPEC 1.011 01/23/2013 0553   PHURINE 6.0 01/23/2020 2136   GLUCOSEU >=500 (A) 01/23/2020 2136   GLUCOSEU Negative 01/23/2013 0553   HGBUR NEGATIVE 01/23/2020 2136   BILIRUBINUR NEGATIVE 01/23/2020 2136   BILIRUBINUR Negative 11/20/2017 1026   BILIRUBINUR Negative 01/23/2013 0553   KETONESUR NEGATIVE 01/23/2020 2136   PROTEINUR NEGATIVE 01/23/2020 2136   NITRITE NEGATIVE 01/23/2020 2136   LEUKOCYTESUR NEGATIVE 01/23/2020 2136   LEUKOCYTESUR Negative 01/23/2013 0553    Radiological Exams on Admission: CT ANGIO GI BLEED  Result Date: 02/27/2022 CLINICAL DATA:  Dark stool for 3 days, dizziness, hypotension EXAM: CTA ABDOMEN AND PELVIS WITHOUT AND WITH CONTRAST TECHNIQUE: Multidetector CT imaging of the abdomen and pelvis was performed using the standard protocol during bolus administration of intravenous contrast. Multiplanar reconstructed images and MIPs were obtained and reviewed to evaluate the vascular anatomy. RADIATION DOSE REDUCTION: This exam was performed according to the departmental dose-optimization program which includes automated exposure control, adjustment of the mA and/or kV according to patient size and/or use of iterative reconstruction technique. CONTRAST:  121m OMNIPAQUE IOHEXOL 350 MG/ML SOLN COMPARISON:  07/01/2021 FINDINGS: VASCULAR Aorta: There is mild diffuse atherosclerosis. Fusiform infrarenal abdominal aortic aneurysm measures up to 3.4 cm in maximal diameter, not appreciably changed since prior exam. No evidence of dissection or critical stenosis. Celiac: Patent without evidence of aneurysm, dissection, vasculitis or  significant stenosis. Stable atherosclerosis and mild narrowing at the origin of the celiac axis is unchanged. SMA: Patent without evidence of aneurysm,  dissection, vasculitis or significant stenosis. Stable atherosclerosis. Renals: There is an accessory left renal artery supplying the lower pole of the left kidney. There is moderate atherosclerosis at the origin of the main bilateral renal arteries, with less than 50% stenosis bilaterally. No evidence of dissection, vasculitis, or aneurysm. IMA: Patent without evidence of aneurysm, dissection, vasculitis or significant stenosis. Inflow: Patent without evidence of aneurysm, dissection, vasculitis or significant stenosis. Diffuse atherosclerosis. Proximal Outflow: Bilateral common femoral and visualized portions of the superficial and profunda femoral arteries are patent without evidence of aneurysm, dissection, vasculitis or significant stenosis. Diffuse atherosclerosis. Veins: No obvious venous abnormality within the limitations of this arterial phase study. Review of the MIP images confirms the above findings. NON-VASCULAR Lower chest: No acute pleural or parenchymal lung disease. Hepatobiliary: Small gallstones layer dependently within the gallbladder, with no evidence of cholecystitis. No biliary duct dilation or evidence of choledocholithiasis. The liver is unremarkable. Pancreas: Unremarkable. No pancreatic ductal dilatation or surrounding inflammatory changes. Spleen: Normal in size without focal abnormality. Adrenals/Urinary Tract: Adrenal glands are unremarkable. Kidneys are normal, without renal calculi, focal lesion, or hydronephrosis. Bladder is unremarkable. Stomach/Bowel: No bowel obstruction or ileus. There is diverticulosis of the sigmoid colon without evidence of acute diverticulitis. The appendix is likely surgically absent. No bowel wall thickening or inflammatory change. There is no evidence of intraluminal contrast accumulation within the bowel  to suggest active gastrointestinal hemorrhage. Lymphatic: No pathologic adenopathy within the abdomen or pelvis. Reproductive: Prostate is unremarkable. Other: No free fluid or free intraperitoneal gas. No abdominal wall hernia. Musculoskeletal: No acute or destructive bony lesions. Reconstructed images demonstrate no additional findings. IMPRESSION: VASCULAR 1. No evidence of active gastrointestinal bleeding. 2. Stable fusiform infrarenal abdominal aortic aneurysm measuring up to 3.4 cm. Recommend follow-up every 3 years. Reference: J Am Coll Radiol 7035;00:938-182. 3.  Aortic Atherosclerosis (ICD10-I70.0). NON-VASCULAR 1. Cholelithiasis without cholecystitis. 2. Sigmoid diverticulosis without diverticulitis. Electronically Signed   By: Randa Ngo M.D.   On: 02/27/2022 16:51    EKG: Independently reviewed.  Paced rhythm, no acute changes  Assessment/Plan Principal Problem:   Upper GI bleeding Active Problems:   GASTROESOPHAGEAL REFLUX DISEASE   Atrial fibrillation   Hypertension   Hyperlipidemia   Type 2 diabetes mellitus with hyperglycemia (HCC)   Chronic kidney disease, stage II (mild)   Long term current use of anticoagulant therapy   Coronary artery disease   OSA (obstructive sleep apnea)   Polyp of colon     1.  Acute GI bleeding, suspected upper GI bleed.  Required coagulopathy on Coumadin and Plavix.  Symptomatic anemia with ongoing GI bleeding. Agree with admission to monitored unit.  CT angiogram was negative for active extravasation, however high risk of rebleeding. Patient is still dehydrated, will give 2 L of normal saline bolus.  Receiving 1 unit of PRBC now.  May need more blood transfusions after hemodilution. Maintenance IV fluids normal saline 100 mm/h. Protonix 40 mg IV twice daily. Patient received 10 mg IV of vitamin K with suspected active bleeding to reverse Coumadin.  Check hemoglobin every 6 hours after initial blood transfusion, transfuse to keep hemoglobin  more than 8 with active bleeding and cardiovascular disease. Updated with gastroenterology, clear liquid diet now and anticipate upper GI endoscopy followed by colonoscopy later if no source is found.  2.  Essential hypertension: At risk of hypotension.  Discontinue all antihypertensive medications.  3.  Type 2 diabetes with hyperglycemia: Well-controlled at home.  On oral hypoglycemics.  Hold all oral  hypoglycemics and keep insulin scale insulin.  4.  Chronic medical issues including  Permanent A-fib, currently in sinus rhythm.  Rate controlled has a pacemaker. Hyperlipidemia, on a statin.  Hold until clinical improvement. Obstructive sleep apnea, does not use CPAP at home.  Did not tolerate. Chronic kidney disease stage II, BUN elevated suggesting active bleeding.  Renal functions at about baseline.    DVT prophylaxis: SCDs Code Status: Full code Family Communication: None at the bedside. Disposition Plan: Home once clinically improved Consults called: Gastroenterology, [facility] Admission status: Inpatient, progressive care unit   Barb Merino MD [facility] Pager [phone]

## 2022-02-27 NOTE — ED Provider Notes (Addendum)
[facility] DEPT Provider Note   CSN: [id number] Arrival date & time: 02/27/22  1420     History  Chief Complaint  Patient presents with   Rectal Bleeding    [name] Cook is a 71 y.o. male.  Patient is a 71 year old male with a history of persistent atrial fibrillation status post pacemaker on chronic anticoagulation with Coumadin and Plavix, COPD, hypertension, diabetes and prior diverticulosis presenting today due to dizziness and black stools.  Patient reports on Friday he started having abdominal discomfort and started noticing his stool was black.  For the last few days he has felt very dizzy and lightheaded.  Anytime he gets up to do anything he feels like he might pass out.  He is also had some dyspnea on exertion.  He denies any chest pain but is still having some abdominal pain which she reports is more in his lower abdomen.  No urinary complaints at this time.  Denies prior history of GI bleeding.  Last colonoscopy was in 2021 and showed diverticulosis.  The history is provided by the patient.  Rectal Bleeding      Home Medications Prior to Admission medications   Medication Sig Start Date End Date Taking? Authorizing Provider  albuterol (VENTOLIN HFA) 108 (90 Base) MCG/ACT inhaler INHALE 2 PUFFS BY MOUTH EVERY 4 HOURS AS NEEDED FOR WHEEZE OR FOR SHORTNESS OF BREATH Patient taking differently: Inhale 2 puffs into the lungs every 4 (four) hours as needed for wheezing or shortness of breath. 01/06/22  Yes [name], NP  nitroGLYCERIN (NITROSTAT) 0.4 MG SL tablet Place 1 tablet (0.4 mg total) under the tongue every 5 (five) minutes as needed for chest pain. 04/08/21  Yes [name], NP  tamsulosin (FLOMAX) 0.4 MG CAPS capsule TAKE 1 CAPSULE (0.4 MG TOTAL) BY MOUTH 2 (TWO) TIMES DAILY. Patient taking differently: Take 0.4 mg by mouth at bedtime. 01/12/21  Yes Noreene Filbert, MD  blood glucose meter kit and supplies KIT Dispense  based on patient and insurance preference. Use up to four times daily as directed. (FOR ICD-9 250.00, 250.01). 04/08/21   [name], NP  budesonide-formoterol [facility]) 160-4.5 MCG/ACT inhaler INHALE 2 PUFFS INTO THE LUNGS TWICE A DAY 01/12/21   [name], NP  cetirizine (ZYRTEC) 10 MG tablet TAKE 1 TABLET BY MOUTH EVERYDAY AT BEDTIME 09/26/21   Dutch Quint B, FNP  clopidogrel (PLAVIX) 75 MG tablet TAKE 1 TABLET BY MOUTH EVERY DAY Patient taking differently: Take 75 mg by mouth daily. 08/29/21   [name], MD  diclofenac Sodium (VOLTAREN) 1 % GEL Apply 2 g topically 4 (four) times daily. 07/17/19   Khatri, Hina, PA-C  fish oil-omega-3 fatty acids 1000 MG capsule Take 1 g by mouth daily.      [provider]  fluticasone (FLONASE) 50 MCG/ACT nasal spray PLACE 1 SPRAY INTO BOTH NOSTRILS DAILY AS NEEDED FOR ALLERGIES OR RHINITIS. 06/23/21   [name], NP  furosemide (LASIX) 20 MG tablet TAKE 1 TABLET BY MOUTH EVERY DAY 08/29/21   [name], MD  gabapentin (NEURONTIN) 300 MG capsule TAKE 1 CAPSULE BY MOUTH EVERY MORNING AND 2 CAPSULES BY MOUTH AT NIGHT FOR NEUROPATHY 11/18/21   [name], NP  glipiZIDE (GLUCOTROL XL) 10 MG 24 hr tablet TAKE 1 TABLET (10 MG TOTAL) BY MOUTH DAILY WITH BREAKFAST. FOR DIABETES. 12/05/21   [name], NP  glucose blood test strip 1 each by Other route  as needed for other. Use to check blood sugar up to three times a day-Accu-Chek meter    [provider]  isosorbide mononitrate (IMDUR) 60 MG 24 hr tablet TAKE 1 TABLET BY MOUTH EVERY DAY 01/06/22   [name], MD  JARDIANCE 25 MG TABS tablet TAKE 1 TABLET (25 MG TOTAL) BY MOUTH DAILY BEFORE BREAKFAST. FOR DIABETES. 12/22/21   [name], NP  lisinopril (ZESTRIL) 2.5 MG tablet TAKE 1 TABLET BY MOUTH EVERY DAY 07/11/21   [name], MD  pantoprazole (PROTONIX) 40 MG tablet TAKE 1 TABLET (40 MG TOTAL) BY MOUTH DAILY FOR HEARTBURN. 10/26/20   [name], NP  rosuvastatin (CRESTOR) 20 MG tablet TAKE 1 TABLET BY MOUTH EVERY DAY IN THE EVENING FOR CHOLESTEROL 11/24/21   [name], NP  sertraline (ZOLOFT) 50 MG tablet TAKE 1 TABLET (50 MG TOTAL) BY MOUTH DAILY. FOR ANXIETY AND DEPRESSION. 01/06/22   [name], NP  warfarin (COUMADIN) 5 MG tablet TAKE 1/2 TABLET DAILY BY MOUTH EXCEPT TAKE 1 TABLET ON MONDAYS AND THURSDAYS OR AS DIRECTED BY ANTICOAGULATION CLINIC 01/06/22   [name], NP      Allergies    Januvia [sitagliptin] and Metformin and related    Review of Systems   Review of Systems  Gastrointestinal:  Positive for hematochezia.    Physical Exam Updated Vital Signs BP (!) 104/55   Pulse 70   Temp 97.9 F (36.6 C)   Resp 19   Ht _0  (1.803 m)   Wt 96 kg   SpO2 100%   BMI 29.52 kg/m  Physical Exam Vitals and nursing note reviewed.  Constitutional:      General: He is not in acute distress.    Appearance: He is well-developed. He is ill-appearing.  HENT:     Head: Normocephalic and atraumatic.     Mouth/Throat:     Comments: Pale gums Eyes:     Conjunctiva/sclera: Conjunctivae normal.     Pupils: Pupils are equal, round, and reactive to light.  Cardiovascular:     Rate and Rhythm: Normal rate and regular rhythm.     Heart sounds: No murmur heard. Pulmonary:     Effort: Pulmonary effort is normal. No respiratory distress.     Breath sounds: Normal breath sounds. No wheezing or rales.  Abdominal:     General: There is no distension.     Palpations: Abdomen is soft.     Tenderness: There is abdominal tenderness. There is no guarding or rebound.     Comments: Tenderness in the lower abdomen diffusely  Genitourinary:    Rectum: Guaiac result positive.     Comments: Charcoal black stool Musculoskeletal:        General: No tenderness. Normal range of motion.     Cervical back: Normal range of motion and neck supple.  Skin:    General: Skin is warm and dry.     Coloration: Skin  is pale.     Findings: No erythema or rash.  Neurological:     Mental Status: He is alert and oriented to person, place, and time. Mental status is at baseline.  Psychiatric:        Mood and Affect: Mood normal.        Behavior: Behavior normal.     ED Results / Procedures / Treatments   Labs (all labs ordered are listed, but only abnormal results are displayed) Labs Reviewed  CBC WITH DIFFERENTIAL/PLATELET -  Abnormal; Notable for the following components:      Result Value   WBC 11.8 (*)    RBC 2.98 (*)    Hemoglobin 9.7 (*)    HCT 30.3 (*)    MCV 101.7 (*)    Platelets 148 (*)    Neutro Abs 10.0 (*)    All other components within normal limits  LACTIC ACID, PLASMA - Abnormal; Notable for the following components:   Lactic Acid, Venous 3.3 (*)    All other components within normal limits  COMPREHENSIVE METABOLIC PANEL - Abnormal; Notable for the following components:   Chloride 112 (*)    CO2 16 (*)    Glucose, Bld 261 (*)    BUN 75 (*)    Total Protein 5.3 (*)    Albumin 3.3 (*)    AST 14 (*)    Alkaline Phosphatase 33 (*)    All other components within normal limits  PROTIME-INR - Abnormal; Notable for the following components:   Prothrombin Time 28.9 (*)    INR 2.8 (*)    All other components within normal limits  POC OCCULT BLOOD, ED - Abnormal; Notable for the following components:   Fecal Occult Bld POSITIVE (*)    All other components within normal limits  APTT  LACTIC ACID, PLASMA  HEMOGLOBIN AND HEMATOCRIT, BLOOD  HEMOGLOBIN AND HEMATOCRIT, BLOOD  HEMOGLOBIN AND HEMATOCRIT, BLOOD  COMPREHENSIVE METABOLIC PANEL  MAGNESIUM  PROTIME-INR  TYPE AND SCREEN  PREPARE RBC (CROSSMATCH)  ABO/RH    EKG EKG Interpretation  Date/Time:  Monday February 27 2022 14:40:39 EST Ventricular Rate:  90 PR Interval:  143 QRS Duration: 168 QT Interval:  428 QTC Calculation: 524 R Axis:   -69 Text Interpretation: ATRIAL PACED RHYTHM Nonspecific IVCD with LAD Left  ventricular hypertrophy Inferior infarct, old Anterior infarct, old Confirmed by Blanchie Dessert [phone]) on 02/27/2022 3:58:49 PM  Radiology CT ANGIO GI BLEED  Result Date: 02/27/2022 CLINICAL DATA:  Dark stool for 3 days, dizziness, hypotension EXAM: CTA ABDOMEN AND PELVIS WITHOUT AND WITH CONTRAST TECHNIQUE: Multidetector CT imaging of the abdomen and pelvis was performed using the standard protocol during bolus administration of intravenous contrast. Multiplanar reconstructed images and MIPs were obtained and reviewed to evaluate the vascular anatomy. RADIATION DOSE REDUCTION: This exam was performed according to the departmental dose-optimization program which includes automated exposure control, adjustment of the mA and/or kV according to patient size and/or use of iterative reconstruction technique. CONTRAST:  152m OMNIPAQUE IOHEXOL 350 MG/ML SOLN COMPARISON:  07/01/2021 FINDINGS: VASCULAR Aorta: There is mild diffuse atherosclerosis. Fusiform infrarenal abdominal aortic aneurysm measures up to 3.4 cm in maximal diameter, not appreciably changed since prior exam. No evidence of dissection or critical stenosis. Celiac: Patent without evidence of aneurysm, dissection, vasculitis or significant stenosis. Stable atherosclerosis and mild narrowing at the origin of the celiac axis is unchanged. SMA: Patent without evidence of aneurysm, dissection, vasculitis or significant stenosis. Stable atherosclerosis. Renals: There is an accessory left renal artery supplying the lower pole of the left kidney. There is moderate atherosclerosis at the origin of the main bilateral renal arteries, with less than 50% stenosis bilaterally. No evidence of dissection, vasculitis, or aneurysm. IMA: Patent without evidence of aneurysm, dissection, vasculitis or significant stenosis. Inflow: Patent without evidence of aneurysm, dissection, vasculitis or significant stenosis. Diffuse atherosclerosis. Proximal Outflow: Bilateral  common femoral and visualized portions of the superficial and profunda femoral arteries are patent without evidence of aneurysm, dissection, vasculitis or significant stenosis. Diffuse atherosclerosis.  Veins: No obvious venous abnormality within the limitations of this arterial phase study. Review of the MIP images confirms the above findings. NON-VASCULAR Lower chest: No acute pleural or parenchymal lung disease. Hepatobiliary: Small gallstones layer dependently within the gallbladder, with no evidence of cholecystitis. No biliary duct dilation or evidence of choledocholithiasis. The liver is unremarkable. Pancreas: Unremarkable. No pancreatic ductal dilatation or surrounding inflammatory changes. Spleen: Normal in size without focal abnormality. Adrenals/Urinary Tract: Adrenal glands are unremarkable. Kidneys are normal, without renal calculi, focal lesion, or hydronephrosis. Bladder is unremarkable. Stomach/Bowel: No bowel obstruction or ileus. There is diverticulosis of the sigmoid colon without evidence of acute diverticulitis. The appendix is likely surgically absent. No bowel wall thickening or inflammatory change. There is no evidence of intraluminal contrast accumulation within the bowel to suggest active gastrointestinal hemorrhage. Lymphatic: No pathologic adenopathy within the abdomen or pelvis. Reproductive: Prostate is unremarkable. Other: No free fluid or free intraperitoneal gas. No abdominal wall hernia. Musculoskeletal: No acute or destructive bony lesions. Reconstructed images demonstrate no additional findings. IMPRESSION: VASCULAR 1. No evidence of active gastrointestinal bleeding. 2. Stable fusiform infrarenal abdominal aortic aneurysm measuring up to 3.4 cm. Recommend follow-up every 3 years. Reference: J Am Coll Radiol 8546;27:035-009. 3.  Aortic Atherosclerosis (ICD10-I70.0). NON-VASCULAR 1. Cholelithiasis without cholecystitis. 2. Sigmoid diverticulosis without diverticulitis.  Electronically Signed   By: Randa Ngo M.D.   On: 02/27/2022 16:51    Procedures Procedures    Medications Ordered in ED Medications  0.9 %  sodium chloride infusion (has no administration in time range)  phytonadione (VITAMIN K) 10 mg in dextrose 5 % 50 mL IVPB (10 mg Intravenous New Bag/Given 02/27/22 1654)  pantoprazole (PROTONIX) injection 40 mg (has no administration in time range)  insulin aspart (novoLOG) injection 0-9 Units (has no administration in time range)  insulin aspart (novoLOG) injection 0-5 Units (has no administration in time range)  pantoprazole (PROTONIX) injection 40 mg (40 mg Intravenous Given 02/27/22 1627)  iohexol (OMNIPAQUE) 350 MG/ML injection 100 mL (100 mLs Intravenous Contrast Given 02/27/22 1630)    ED Course/ Medical Decision Making/ A&P                           Medical Decision Making Amount and/or Complexity of Data Reviewed Independent Historian: EMS External Data Reviewed: notes.    Details: gi Labs: ordered. Decision-making details documented in ED Course. Radiology: ordered and independent interpretation performed. Decision-making details documented in ED Course. ECG/medicine tests: ordered and independent interpretation performed. Decision-making details documented in ED Course.  Risk Prescription drug management. Decision regarding hospitalization.   Pt with multiple medical problems and comorbidities and presenting today with a complaint that caries a high risk for morbidity and mortality.  Here today with concern of GI bleeding.  Patient is on both Coumadin and Plavix.  It appears he is on Coumadin for atrial fibrillation does not have any chemical valves.  No prior history of PEs.  Patient noticed symptoms 3 days ago.  Initially blood pressure was 70 systolic when EMS arrived with improvement after IV fluids.  Patient is having diffuse lower abdominal pain.  Stool is black and concern for acute GI bleed.  He does have a history of  diverticulosis but given the color of the stool concern for a bleed a little bit higher up.  Patient is not displaying symptoms significant for mesenteric ischemia.  He is awake and mentating normally at this time.  He does not drink  alcohol or history of liver disease.  No prior history of cirrhosis or esophageal varices.  He denies any vomiting or hematemesis.  I independently interpreted patient's EKG and labs.  EKG today which shows a paced rhythm.  CBC with new anemia with a hemoglobin of 9.7 from 15 earlier this year with normal platelets and white count, lactic acid of 3.3 and CMP with a normal creatinine but a BUN of 75 normal anion gap, LFTs are within normal limits.  Patient's INR today is 2.8.  CTA to evaluate for GI bleeding is pending.  However given patient's drop in hemoglobin, symptoms, soft blood pressures we will give 1 unit of blood due to concern for ongoing bleeding.  Also will review reverse Coumadin with vitamin K 10 mg.  Patient also given a dose of Protonix and will discuss with gastroenterology.  5:20 PM I consulted GI spoke with Dr. Watt Climes and they will see the patient.  If patient has active bleeding on his CT they need to be recontacted but patient needs to be clear liquids.  I have independently visualized and interpreted pt's images today.  CT without significant aortic dissection or active GI bleeding.  Radiology reports infrarenal AAA 3.4 cm that just needs follow-up every 3 years.  Patient was admitted to the hospitalist service for further care. CRITICAL CARE Performed by: Tylesha Gibeault Total critical care time: 30 minutes Critical care time was exclusive of separately billable procedures and treating other patients. Critical care was necessary to treat or prevent imminent or life-threatening deterioration. Critical care was time spent personally by me on the following activities: development of treatment plan with patient and/or surrogate as well as nursing, discussions  with consultants, evaluation of patient's response to treatment, examination of patient, obtaining history from patient or surrogate, ordering and performing treatments and interventions, ordering and review of laboratory studies, ordering and review of radiographic studies, pulse oximetry and re-evaluation of patient's condition.           Final Clinical Impression(s) / ED Diagnoses Final diagnoses:  Acute GI bleeding    Rx / DC Orders ED Discharge Orders     None         Blanchie Dessert, MD 02/27/22 1721    Blanchie Dessert, MD 02/27/22 1722

## 2022-02-28 DIAGNOSIS — K922 Gastrointestinal hemorrhage, unspecified: Secondary | ICD-10-CM | POA: Diagnosis not present

## 2022-02-28 LAB — COMPREHENSIVE METABOLIC PANEL
ALT: 11 U/L (ref 0–44)
AST: 12 U/L — ABNORMAL LOW (ref 15–41)
Albumin: 2.7 g/dL — ABNORMAL LOW (ref 3.5–5.0)
Alkaline Phosphatase: 26 U/L — ABNORMAL LOW (ref 38–126)
Anion gap: 6 (ref 5–15)
BUN: 75 mg/dL — ABNORMAL HIGH (ref 8–23)
CO2: 17 mmol/L — ABNORMAL LOW (ref 22–32)
Calcium: 8.7 mg/dL — ABNORMAL LOW (ref 8.9–10.3)
Chloride: 114 mmol/L — ABNORMAL HIGH (ref 98–111)
Creatinine, Ser: 1.12 mg/dL (ref 0.61–1.24)
GFR, Estimated: >60 mL/min (ref >60)
Glucose, Bld: 211 mg/dL — ABNORMAL HIGH (ref 70–99)
Potassium: 4.3 mmol/L (ref 3.5–5.1)
Sodium: 137 mmol/L (ref 135–145)
Total Bilirubin: 0.6 mg/dL (ref 0.3–1.2)
Total Protein: 4.3 g/dL — ABNORMAL LOW (ref 6.5–8.1)

## 2022-02-28 LAB — GLUCOSE, CAPILLARY
Glucose-Capillary: 185 mg/dL — ABNORMAL HIGH (ref 70–99)
Glucose-Capillary: 217 mg/dL — ABNORMAL HIGH (ref 70–99)
Glucose-Capillary: 145 mg/dL — ABNORMAL HIGH (ref 70–99)
Glucose-Capillary: 126 mg/dL — ABNORMAL HIGH (ref 70–99)

## 2022-02-28 LAB — PREPARE RBC (CROSSMATCH)

## 2022-02-28 LAB — HEMOGLOBIN AND HEMATOCRIT, BLOOD
HCT: 22.8 % — ABNORMAL LOW (ref 39.0–52.0)
HCT: 22.4 % — ABNORMAL LOW (ref 39.0–52.0)
HCT: 23.8 % — ABNORMAL LOW (ref 39.0–52.0)
HCT: 25.1 % — ABNORMAL LOW (ref 39.0–52.0)
Hemoglobin: 7.7 g/dL — ABNORMAL LOW (ref 13.0–17.0)
Hemoglobin: 7.2 g/dL — ABNORMAL LOW (ref 13.0–17.0)
Hemoglobin: 7.8 g/dL — ABNORMAL LOW (ref 13.0–17.0)
Hemoglobin: 8.2 g/dL — ABNORMAL LOW (ref 13.0–17.0)

## 2022-02-28 LAB — PROTIME-INR
INR: 1.6 — ABNORMAL HIGH (ref 0.8–1.2)
Prothrombin Time: 19.1 seconds — ABNORMAL HIGH (ref 11.4–15.2)

## 2022-02-28 LAB — HIV ANTIBODY (ROUTINE TESTING W REFLEX): HIV Screen 4th Generation wRfx: NONREACTIVE

## 2022-02-28 LAB — MAGNESIUM: Magnesium: 1.9 mg/dL (ref 1.7–2.4)

## 2022-02-28 MED ORDER — LIP MEDEX EX OINT
TOPICAL_OINTMENT | CUTANEOUS | Status: DC | PRN
  Administered 2022-02-28: 75 via TOPICAL
  Filled 2022-02-28: qty 7

## 2022-02-28 MED ORDER — SODIUM CHLORIDE 0.9% IV SOLUTION: Freq: Once | INTRAVENOUS | Status: AC

## 2022-02-28 MED ORDER — ALUM & MAG HYDROXIDE-SIMETH 200-200-20 MG/5ML PO SUSP
30.0000 mL | ORAL | Status: DC | PRN
  Administered 2022-03-01: 30 mL via ORAL
  Filled 2022-02-28: qty 30

## 2022-02-28 NOTE — Progress Notes (Signed)
Patient incontinent of a small amount of very dark stool.

## 2022-02-28 NOTE — TOC Progression Note (Signed)
Transition of Care [facility]) - Progression Note    Patient Details  Name: [name] Cook MRN: [id number] Date of Birth: [birth year]  Transition of [facility] CM/SW Contact  Purcell Mouton, RN Phone Number: 02/28/2022, 1:33 PM  Clinical Narrative:     TOCS completed. Will continue to follow for discharge needs.  Expected Discharge Plan: Home/Self Care Barriers to Discharge: No Barriers Identified  Expected Discharge Plan and Services Expected Discharge Plan: Home/Self Care     Post Acute Care Choice: NA Living arrangements for the past 2 months: Single Family Home                                       Social Determinants of Health (SDOH) Interventions    Readmission Risk Interventions     No data to display

## 2022-02-28 NOTE — Progress Notes (Signed)
Nutrition Brief Note  Patient identified on the Malnutrition Screening Tool (MST) Report  Patient with stable weights. UBW ~215 lbs Pt reports he lost 20 lbs at one point following him leaving his job in 2009. Pt states PTA he was eating 3 meals a day with no issues with appetite.   Wt Readings from Last 15 Encounters:  02/28/22 98.3 kg  12/20/21 96.6 kg  10/26/21 96.6 kg  10/07/21 97.1 kg  09/19/21 97.1 kg  09/14/21 97.1 kg  08/19/21 97.7 kg  08/12/21 96 kg  07/28/21 96.2 kg  06/23/21 96.6 kg  06/15/21 94.8 kg  06/13/21 95.3 kg  06/02/21 98.1 kg  06/02/21 99.2 kg  05/27/21 96.2 kg    Body mass index is 30.23 kg/m. Patient meets criteria for obesity based on current BMI.   Current diet order is Clear liquids, tolerating. Labs and medications reviewed.   No nutrition interventions warranted at this time. If nutrition issues arise, please consult RD.   Clayton Bibles, MS, RD, LDN Inpatient Clinical Dietitian Contact information available via Amion

## 2022-02-28 NOTE — Consult Note (Signed)
Referring Provider: [facility] Primary Care Physician:  [name], NP Primary Gastroenterologist:  Dr. Bailey Mech  Reason for Consultation: Melena  HPI: [name] Cook is a 71 y.o. male medical history significant of diverticulosis, persistent A-fib status post pacemaker on anticoagulation with Coumadin, also takes Plavix with history of coronary artery disease and last stent being more than 5 years ago, essential hypertension, type 2 diabetes on oral hypoglycemics presents for evaluation of melena.  Patient states he has noticed melena ongoing since 02/24/2022.  Reports no previous history of melena.  Denies NSAIDs.  Remote history of alcohol and tobacco use prior to 2002.  Patient states he has felt very weak and feels like when he sits on the toilet he is get a pass out.  Reports dark tarry stools and mild abdominal discomfort.  History of colonoscopy 05/2021 due to adenomatous polyps.  2 adenomatous polyps.  No previous EGD.  Denies family history of colon cancer or other GI malignancies.  Last dose of Plavix and warfarin was yesterday  Past Medical History:  Diagnosis Date   Acute bronchitis with COPD ([place]) 03/07/2021   Arthritis    "knees and lower back" (03/14/2013)   Atrial flutter ([place])    radiofrequency ablation in 2001   CAD (coronary artery disease)    a. Nonobstructive. Cardiac cath in 2001-50% mid RI, normal LM, LAD, RCA b. cath 10/16/2014 95% mid RCA treated with DES, 99% ost D1 medical management due to small aneurysmal segment   Chronic anticoagulation    chronic Coumadin anticoagulation   Chronic obstructive pulmonary disease ([place]) 04/20/2011   Diabetes mellitus, type 2 (HCC)    Elevated lipase 06/23/2021   GERD (gastroesophageal reflux disease)    Hyperlipidemia    Hypertension    with hypertensive heart disease   Left knee pain 10/25/2017   medial   Obesity    Persistent atrial fibrillation ([place])    recurrent atrial flutter since 2001 s/p DCCVs, multiple failed AADs, h/o  tachy-mediated cardiomyopathy   Shortness of breath    "can come on at any time" (03/14/2013)   Sleep apnea    "dx'd; couldn't wear the mask" (03/14/2013)   Tobacco abuse     Past Surgical History:  Procedure Laterality Date   ATRIAL FLUTTER ABLATION  2002   atrial flutter; subsequently developed atrial fibrillation   AV NODE ABLATION  01/24/2013   CARDIAC CATHETERIZATION  2002   CARDIAC CATHETERIZATION N/A 10/16/2014   Procedure: Left Heart Cath and Coronary Angiography;  Surgeon: [name], MD;  Location: [facility];  Service: Cardiovascular;  Laterality: N/A;   CARDIOVERSION  05/31/2011   Procedure: CARDIOVERSION;  Surgeon: Cristopher Estimable. Lattie Haw, MD;  Location: [facility];  Service: Cardiovascular;  Laterality: N/A;   CARPAL TUNNEL RELEASE Left 1980's   COLONOSCOPY WITH PROPOFOL N/A 12/30/2018   Procedure: COLONOSCOPY WITH PROPOFOL;  Surgeon: Jonathon Bellows, MD;  Location: [facility] ENDOSCOPY;  Service: Gastroenterology;  Laterality: N/A;   COLONOSCOPY WITH PROPOFOL N/A 12/04/2019   Procedure: COLONOSCOPY WITH PROPOFOL;  Surgeon: Jonathon Bellows, MD;  Location: [facility] ENDOSCOPY;  Service: Gastroenterology;  Laterality: N/A;   COLONOSCOPY WITH PROPOFOL N/A 06/13/2021   Procedure: COLONOSCOPY WITH PROPOFOL;  Surgeon: Jonathon Bellows, MD;  Location: [facility] ENDOSCOPY;  Service: Gastroenterology;  Laterality: N/A;   INSERT / REPLACE / REMOVE PACEMAKER  01/24/2013    Medtronic Adapta L dual-chamber pacemaker, serial number [id number]    LEFT HEART CATH AND CORONARY ANGIOGRAPHY N/A 06/07/2018   Procedure: LEFT HEART CATH  AND CORONARY ANGIOGRAPHY;  Surgeon: [name], MD;  Location: [facility];  Service: Cardiovascular;  Laterality: N/A;   LEFT HEART CATHETERIZATION WITH CORONARY ANGIOGRAM N/A 03/17/2013   Procedure: LEFT HEART CATHETERIZATION WITH CORONARY ANGIOGRAM;  Surgeon: Burnell Blanks, MD; LAD mild dz, D1 branch 100%, inferior branch 99%, CFX OK, RCA 50%, EF 65%     LOOP RECORDER  IMPLANT  2002   PERMANENT PACEMAKER INSERTION N/A 01/24/2013   Procedure: PERMANENT PACEMAKER INSERTION;  Surgeon: [name], MD;  Location[facility] CATH LAB;  Service: Cardiovascular;  Laterality: N/A;   TIBIAL TUBERCLERPLASTY  ~ 2003    Prior to Admission medications   Medication Sig Start Date End Date Taking? Authorizing Provider  albuterol (VENTOLIN HFA) 108 (90 Base) MCG/ACT inhaler INHALE 2 PUFFS BY MOUTH EVERY 4 HOURS AS NEEDED FOR WHEEZE OR FOR SHORTNESS OF BREATH Patient taking differently: Inhale 2 puffs into the lungs every 4 (four) hours as needed for wheezing or shortness of breath. 01/06/22  Yes [name], NP  Ascorbic Acid (VITAMIN C) 1000 MG tablet Take 2,000 mg by mouth daily.   Yes [provider]  budesonide-formoterol (SYMBICORT) 160-4.5 MCG/ACT inhaler INHALE 2 PUFFS INTO THE LUNGS TWICE A DAY Patient taking differently: Inhale 2 puffs into the lungs 2 (two) times daily as needed ("for flares"). 01/12/21  Yes [name], NP  clopidogrel (PLAVIX) 75 MG tablet TAKE 1 TABLET BY MOUTH EVERY DAY Patient taking differently: Take 75 mg by mouth daily. 08/29/21  Yes [name], MD  fish oil-omega-3 fatty acids 1000 MG capsule Take 2 g by mouth daily.   Yes [provider]  fluticasone (FLONASE) 50 MCG/ACT nasal spray PLACE 1 SPRAY INTO BOTH NOSTRILS DAILY AS NEEDED FOR ALLERGIES OR RHINITIS. Patient taking differently: Place 1 spray into both nostrils daily as needed for allergies or rhinitis. 06/23/21  Yes [name], NP  furosemide (LASIX) 20 MG tablet TAKE 1 TABLET BY MOUTH EVERY DAY Patient taking differently: Take 20 mg by mouth in the morning. 08/29/21  Yes [name], MD  gabapentin (NEURONTIN) 300 MG capsule TAKE 1 CAPSULE BY MOUTH EVERY MORNING AND 2 CAPSULES BY MOUTH AT NIGHT FOR NEUROPATHY Patient taking differently: Take 300 mg by mouth in the morning and at bedtime. 11/18/21  Yes [name], NP  glipiZIDE (GLUCOTROL  XL) 10 MG 24 hr tablet TAKE 1 TABLET (10 MG TOTAL) BY MOUTH DAILY WITH BREAKFAST. FOR DIABETES. 12/05/21  Yes [name], NP  isosorbide mononitrate (IMDUR) 60 MG 24 hr tablet TAKE 1 TABLET BY MOUTH EVERY DAY Patient taking differently: Take 60 mg by mouth daily. 01/06/22  Yes [name], MD  JARDIANCE 25 MG TABS tablet TAKE 1 TABLET (25 MG TOTAL) BY MOUTH DAILY BEFORE BREAKFAST. FOR DIABETES. Patient taking differently: 25 mg daily before breakfast. 12/22/21  Yes [name], NP  lisinopril (ZESTRIL) 2.5 MG tablet TAKE 1 TABLET BY MOUTH EVERY DAY Patient taking differently: Take 2.5 mg by mouth in the morning. 07/11/21  Yes [name], MD  nitroGLYCERIN (NITROSTAT) 0.4 MG SL tablet Place 1 tablet (0.4 mg total) under the tongue every 5 (five) minutes as needed for chest pain. 04/08/21  Yes [name], NP  rosuvastatin (CRESTOR) 20 MG tablet TAKE 1 TABLET BY MOUTH EVERY DAY IN THE EVENING FOR CHOLESTEROL Patient taking differently: Take 20 mg by mouth at bedtime. 11/24/21  Yes [name], NP  sertraline (  ZOLOFT) 50 MG tablet TAKE 1 TABLET (50 MG TOTAL) BY MOUTH DAILY. FOR ANXIETY AND DEPRESSION. 01/06/22  Yes [name], NP  tamsulosin (FLOMAX) 0.4 MG CAPS capsule TAKE 1 CAPSULE (0.4 MG TOTAL) BY MOUTH 2 (TWO) TIMES DAILY. Patient taking differently: Take 0.4 mg by mouth at bedtime. 01/12/21  Yes Chrystal, Eulas Post, MD  TYLENOL 500 MG tablet Take 500-1,000 mg by mouth every 6 (six) hours as needed for mild pain or headache.   Yes [provider]  warfarin (COUMADIN) 5 MG tablet TAKE 1/2 TABLET DAILY BY MOUTH EXCEPT TAKE 1 TABLET ON MONDAYS AND [place] OR AS DIRECTED BY ANTICOAGULATION CLINIC Patient taking differently: Take 2.5-5 mg by mouth See admin instructions. Take 2.5 mg by mouth in the morning on Sun/Tues/Wed/Fri/Sat and 5 mg on Mon/Thurs 01/06/22  Yes [name], NP  blood glucose meter kit and supplies KIT Dispense based on patient and  insurance preference. Use up to four times daily as directed. (FOR ICD-9 250.00, 250.01). 04/08/21   [name], NP  cetirizine (ZYRTEC) 10 MG tablet TAKE 1 TABLET BY MOUTH EVERYDAY AT BEDTIME Patient not taking: Reported on 02/27/2022 09/26/21   Dutch Quint B, FNP  diclofenac Sodium (VOLTAREN) 1 % GEL Apply 2 g topically 4 (four) times daily. Patient not taking: Reported on 02/27/2022 07/17/19   Delia Heady, PA-C  glucose blood test strip 1 each by Other route as needed for other. Use to check blood sugar up to three times a day-Accu-Chek meter    [provider]  pantoprazole (PROTONIX) 40 MG tablet TAKE 1 TABLET (40 MG TOTAL) BY MOUTH DAILY FOR HEARTBURN. Patient not taking: Reported on 02/27/2022 10/26/20   [name], NP    Scheduled Meds:  gabapentin  300 mg Oral Daily   gabapentin  600 mg Oral QHS   insulin aspart  0-5 Units Subcutaneous QHS   insulin aspart  0-9 Units Subcutaneous TID WC   mometasone-formoterol  2 puff Inhalation BID   pantoprazole (PROTONIX) IV  40 mg Intravenous Q12H   sertraline  50 mg Oral Daily   Continuous Infusions:  sodium chloride 100 mL/hr at 02/28/22 0739   PRN Meds:.acetaminophen **OR** acetaminophen, albuterol, lip balm, ondansetron **OR** ondansetron (ZOFRAN) IV  Allergies as of 02/27/2022 - Review Complete 02/27/2022  Allergen Reaction Noted   Januvia [sitagliptin] Other (See Comments) 06/23/2021   Metformin and related Diarrhea 06/23/2021    Family History  Problem Relation Age of Onset   Alzheimer's disease Mother    Osteoporosis Mother     Social History   Socioeconomic History   Marital status: Widowed    Spouse name: Not on file   Number of children: 1   Years of education: Not on file   Highest education level: Not on file  Occupational History   Occupation: Unemployed    Employer: UNEMPLOYED  Tobacco Use   Smoking status: Former    Packs/day: 1.00    Years: 42.00    Total pack years: 42.00     Types: Cigarettes    Quit date: 12/30/2013    Years since quitting: 8.1   Smokeless tobacco: Never  Vaping Use   Vaping Use: Never used  Substance and Sexual Activity   Alcohol use: Not Currently    Comment: 03/14/2013 "stopped drinking back in 2002; never had problem w/it"   Drug use: No   Sexual activity: Not Currently  Other Topics Concern   Not on file  Social History Narrative  Single.   Retired.    1 son, deceased.    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   Enjoys playing on the computer.       Social Determinants of Health   Financial Resource Strain: Low Risk  (10/07/2021)   Overall Financial Resource Strain (CARDIA)    Difficulty of Paying Living Expenses: Not hard at all  Food Insecurity: No Food Insecurity (02/27/2022)   Hunger Vital Sign    Worried About Running Out of Food in the Last Year: Never true    Ran Out of Food in the Last Year: Never true  Transportation Needs: No Transportation Needs (02/27/2022)   PRAPARE - Hydrologist (Medical): No    Lack of Transportation (Non-Medical): No  Physical Activity: Unknown (10/07/2021)   Exercise Vital Sign    Days of Exercise per Week: 3 days    Minutes of Exercise per Session: Not on file  Stress: No Stress Concern Present (10/07/2021)   [place]    Feeling of Stress : Not at all  Social Connections: Socially Isolated (10/07/2021)   Social Connection and Isolation Panel [NHANES]    Frequency of Communication with Friends and Family: Once a week    Frequency of Social Gatherings with Friends and Family: Once a week    Attends Religious Services: Never    Marine scientist or Organizations: No    Attends Archivist Meetings: Never    Marital Status: Widowed  Intimate Partner Violence: Not At Risk (02/27/2022)   Humiliation, Afraid, Rape, and Kick questionnaire     Fear of Current or Ex-Partner: No    Emotionally Abused: No    Physically Abused: No    Sexually Abused: No    Review of Systems: Review of Systems  Constitutional:  Positive for malaise/fatigue. Negative for chills, fever and weight loss.  HENT:  Negative for hearing loss and tinnitus.   Eyes:  Negative for blurred vision and double vision.  Respiratory:  Positive for shortness of breath. Negative for cough and hemoptysis.   Cardiovascular:  Negative for chest pain and palpitations.  Gastrointestinal:  Positive for abdominal pain and melena. Negative for blood in stool, constipation, diarrhea, heartburn, nausea and vomiting.  Genitourinary:  Negative for dysuria and urgency.  Musculoskeletal:  Negative for myalgias and neck pain.  Skin:  Negative for itching and rash.  Neurological:  Positive for weakness. Negative for seizures and loss of consciousness.  Psychiatric/Behavioral:  Negative for depression and suicidal ideas.      Physical Exam:Physical Exam Constitutional:      Appearance: Normal appearance.  HENT:     Head: Normocephalic and atraumatic.     Nose: Nose normal. No congestion.     Mouth/Throat:     Mouth: Mucous membranes are moist.     Pharynx: Oropharynx is clear.  Eyes:     General: No scleral icterus.    Extraocular Movements: Extraocular movements intact.  Cardiovascular:     Rate and Rhythm: Normal rate.  Pulmonary:     Effort: Pulmonary effort is normal.     Breath sounds: Normal breath sounds.  Abdominal:     General: Abdomen is flat. Bowel sounds are normal. There is no distension.     Palpations: Abdomen is soft. There is no mass.     Tenderness: There is no abdominal tenderness. There is no guarding or rebound.  Hernia: No hernia is present.  Musculoskeletal:        General: No swelling. Normal range of motion.     Cervical back: Normal range of motion and neck supple.  Skin:    General: Skin is warm and dry.  Neurological:     General: No  focal deficit present.     Mental Status: He is alert and oriented to person, place, and time.  Psychiatric:        Mood and Affect: Mood normal.        Behavior: Behavior normal.        Thought Content: Thought content normal.        Judgment: Judgment normal.     Vital signs: Vitals:   02/28/22 0942 02/28/22 1057  BP: (!) 110/53 (!) 136/58  Pulse: 71 87  Resp: 20   Temp: 97.8 F (36.6 C) 97.6 F (36.4 C)  SpO2: 99% 100%   Last BM Date : 02/27/22    GI:  Lab Results: Recent Labs    02/27/22 1451 02/27/22 1958 02/28/22 0109 02/28/22 0724  WBC 11.8*  --   --   --   HGB 9.7* 9.2* 7.7* 7.2*  HCT 30.3* 28.6* 22.8* 22.4*  PLT 148*  --   --   --    BMET Recent Labs    02/27/22 1451 02/28/22 0109  NA 138 137  K 5.0 4.3  CL 112* 114*  CO2 16* 17*  GLUCOSE 261* 211*  BUN 75* 75*  CREATININE 1.08 1.12  CALCIUM 9.0 8.7*   LFT Recent Labs    02/28/22 0109  PROT 4.3*  ALBUMIN 2.7*  AST 12*  ALT 11  ALKPHOS 26*  BILITOT 0.6   PT/INR Recent Labs    02/27/22 1451 02/28/22 0109  LABPROT 28.9* 19.1*  INR 2.8* 1.6*     Studies/Results: CT ANGIO GI BLEED  Result Date: 02/27/2022 CLINICAL DATA:  Dark stool for 3 days, dizziness, hypotension EXAM: CTA ABDOMEN AND PELVIS WITHOUT AND WITH CONTRAST TECHNIQUE: Multidetector CT imaging of the abdomen and pelvis was performed using the standard protocol during bolus administration of intravenous contrast. Multiplanar reconstructed images and MIPs were obtained and reviewed to evaluate the vascular anatomy. RADIATION DOSE REDUCTION: This exam was performed according to the departmental dose-optimization program which includes automated exposure control, adjustment of the mA and/or kV according to patient size and/or use of iterative reconstruction technique. CONTRAST:  158m OMNIPAQUE IOHEXOL 350 MG/ML SOLN COMPARISON:  07/01/2021 FINDINGS: VASCULAR Aorta: There is mild diffuse atherosclerosis. Fusiform infrarenal  abdominal aortic aneurysm measures up to 3.4 cm in maximal diameter, not appreciably changed since prior exam. No evidence of dissection or critical stenosis. Celiac: Patent without evidence of aneurysm, dissection, vasculitis or significant stenosis. Stable atherosclerosis and mild narrowing at the origin of the celiac axis is unchanged. SMA: Patent without evidence of aneurysm, dissection, vasculitis or significant stenosis. Stable atherosclerosis. Renals: There is an accessory left renal artery supplying the lower pole of the left kidney. There is moderate atherosclerosis at the origin of the main bilateral renal arteries, with less than 50% stenosis bilaterally. No evidence of dissection, vasculitis, or aneurysm. IMA: Patent without evidence of aneurysm, dissection, vasculitis or significant stenosis. Inflow: Patent without evidence of aneurysm, dissection, vasculitis or significant stenosis. Diffuse atherosclerosis. Proximal Outflow: Bilateral common femoral and visualized portions of the superficial and profunda femoral arteries are patent without evidence of aneurysm, dissection, vasculitis or significant stenosis. Diffuse atherosclerosis. Veins: No obvious venous abnormality within  the limitations of this arterial phase study. Review of the MIP images confirms the above findings. NON-VASCULAR Lower chest: No acute pleural or parenchymal lung disease. Hepatobiliary: Small gallstones layer dependently within the gallbladder, with no evidence of cholecystitis. No biliary duct dilation or evidence of choledocholithiasis. The liver is unremarkable. Pancreas: Unremarkable. No pancreatic ductal dilatation or surrounding inflammatory changes. Spleen: Normal in size without focal abnormality. Adrenals/Urinary Tract: Adrenal glands are unremarkable. Kidneys are normal, without renal calculi, focal lesion, or hydronephrosis. Bladder is unremarkable. Stomach/Bowel: No bowel obstruction or ileus. There is diverticulosis  of the sigmoid colon without evidence of acute diverticulitis. The appendix is likely surgically absent. No bowel wall thickening or inflammatory change. There is no evidence of intraluminal contrast accumulation within the bowel to suggest active gastrointestinal hemorrhage. Lymphatic: No pathologic adenopathy within the abdomen or pelvis. Reproductive: Prostate is unremarkable. Other: No free fluid or free intraperitoneal gas. No abdominal wall hernia. Musculoskeletal: No acute or destructive bony lesions. Reconstructed images demonstrate no additional findings. IMPRESSION: VASCULAR 1. No evidence of active gastrointestinal bleeding. 2. Stable fusiform infrarenal abdominal aortic aneurysm measuring up to 3.4 cm. Recommend follow-up every 3 years. Reference: J Am Coll Radiol 7915;04:136-438. 3.  Aortic Atherosclerosis (ICD10-I70.0). NON-VASCULAR 1. Cholelithiasis without cholecystitis. 2. Sigmoid diverticulosis without diverticulitis. Electronically Signed   By: Randa Ngo M.D.   On: 02/27/2022 16:51    Impression: Melena -Hgb 7.2 (9.7 yesterday) baseline 9 months ago 15.4 -BUN 75, creatinine 1.12 -INR 1.6 -CTA shows no evidence of active GI bleeding.  Cholelithiasis.  Sigmoid diverticulosis   Plan: Plan for EGD tomorrow. I thoroughly discussed the procedures to include nature, alternatives, benefits, and risks including but not limited to bleeding, perforation, infection, anesthesia/cardiac and pulmonary complications. Patient provides understanding and gave verbal consent to proceed. Continue Protonix IV 79m BID Continue clear liquid diet NPO at midnight Continue anti-emetics and supportive care as needed. Continue to hold blood thinners [facility] will follow.       LOS: 1 day   BGarnette Scheuermann PA-C 02/28/2022, 11:04 AM  Contact #  [phone]

## 2022-02-28 NOTE — Progress Notes (Signed)
PROGRESS NOTE    [name] Cook  KYH:[id number] DOB: 05-Feb-1951 DOA: 02/27/2022 PCP: [name], NP    Brief Narrative:  71 year old gentleman with history of diverticulosis, persistent A-fib status post pacemaker on anticoagulation with Coumadin, coronary artery disease on Plavix, essential hypertension, type 2 diabetes presented with 3 days of abdominal pain and discomfort, melanotic stool.  Blood pressure 70s when encountered by EMS.  Profoundly symptomatic with orthostatic dizziness and lightheadedness.  Hemoglobin on arrival 9.7 with recent hemoglobin of 15.  CT angiogram with bleeding scan negative.  Admitted to the hospital.   Assessment & Plan:   Acute GI bleeding, suspect upper GI bleeding with melanotic stools in a patient who is coagulopathic on Coumadin and Plavix. Anemia of acute blood loss, significant symptomatic anemia.  Currently remains on Protonix IV twice daily IV fluid bolus and maintenance for volume expansion Received 1 unit of PRBC last night for hemoglobin 9.7-hemoglobin continues to drop with volume expansion-7.2, still symptomatic.  1 more unit of PRBC today. Patient received 10 mg of IV vitamin K in the ER.  INR is 1.6.  We will hold off on further vitamin K. GI will be following, likely upper GI endoscopy tomorrow   Essential hypertension: At risk of hypotension.  Hold all antihypertensives.  Type 2 diabetes with hyperglycemia: Well-controlled at home.  On SSI.  Chronic medical issues including Permanent A-fib, rate controlled Hyperlipidemia, hold statin CKD stage II, stable.  Consented for blood transfusion   DVT prophylaxis: SCDs Start: 02/27/22 1829   Code Status: Full code Family Communication: None at the bedside Disposition Plan: Status is: Inpatient Remains inpatient appropriate because: Active GI bleeding, inpatient procedures anticipated     Consultants:  Gastroenterology  Procedures:  None  Antimicrobials:   None   Subjective: Patient seen and examined.  No overnight events.  He has not mobilized yet but feels more comfortable at rest.  No bowel movement since admission.  Hemoglobin has dropped further to 7.2 after 1 unit of transfusion and IV fluids overnight.  No more abdominal pain. Patient consented for blood transfusion.  Objective: Vitals:   02/27/22 2152 02/28/22 0224 02/28/22 0500 02/28/22 0618  BP: 131/67 (!) 89/49  (!) 99/52  Pulse: 88 79  70  Resp: '17 17  17  '$ Temp: 97.6 F (36.4 C) 97.8 F (36.6 C)  98 F (36.7 C)  TempSrc: Oral Oral  Oral  SpO2: 91% 98%  100%  Weight:   98.3 kg   Height:        Intake/Output Summary (Last 24 hours) at 02/28/2022 0747 Last data filed at 02/28/2022 0700 Gross per 24 hour  Intake 1786.09 ml  Output 1500 ml  Net 286.09 ml   Filed Weights   02/27/22 1448 02/28/22 0500  Weight: 96 kg 98.3 kg    Examination:  General exam: Appears calm and comfortable  Respiratory system: Clear to auscultation. Respiratory effort normal. Cardiovascular system: S1 & S2 heard, RRR. No JVD, murmurs, rubs, gallops or clicks. No pedal edema. Gastrointestinal system: Abdomen is nondistended, soft and nontender. No organomegaly or masses felt. Normal bowel sounds heard. Central nervous system: Alert and oriented. No focal neurological deficits.    Data Reviewed: I have personally reviewed following labs and imaging studies  CBC: Recent Labs  Lab 02/27/22 1451 02/27/22 1958 02/28/22 0109 02/28/22 0724  WBC 11.8*  --   --   --   NEUTROABS 10.0*  --   --   --   HGB 9.7*  9.2* 7.7* 7.2*  HCT 30.3* 28.6* 22.8* 22.4*  MCV 101.7*  --   --   --   PLT 148*  --   --   --    Basic Metabolic Panel: Recent Labs  Lab 02/27/22 1451 02/28/22 0109  NA 138 137  K 5.0 4.3  CL 112* 114*  CO2 16* 17*  GLUCOSE 261* 211*  BUN 75* 75*  CREATININE 1.08 1.12  CALCIUM 9.0 8.7*  MG  --  1.9   GFR: Estimated Creatinine Clearance: 74.4 mL/min (by C-G formula  based on SCr of 1.12 mg/dL). Liver Function Tests: Recent Labs  Lab 02/27/22 1451 02/28/22 0109  AST 14* 12*  ALT 13 11  ALKPHOS 33* 26*  BILITOT 0.8 0.6  PROT 5.3* 4.3*  ALBUMIN 3.3* 2.7*   No results for input(s): "LIPASE", "AMYLASE" in the last 168 hours. No results for input(s): "AMMONIA" in the last 168 hours. Coagulation Profile: Recent Labs  Lab 02/27/22 1451 02/28/22 0109  INR 2.8* 1.6*   Cardiac Enzymes: No results for input(s): "CKTOTAL", "CKMB", "CKMBINDEX", "TROPONINI" in the last 168 hours. BNP (last 3 results) No results for input(s): "PROBNP" in the last 8760 hours. HbA1C: No results for input(s): "HGBA1C" in the last 72 hours. CBG: Recent Labs  Lab 02/27/22 2159 02/28/22 0735  GLUCAP 208* 185*   Lipid Profile: No results for input(s): "CHOL", "HDL", "LDLCALC", "TRIG", "CHOLHDL", "LDLDIRECT" in the last 72 hours. Thyroid Function Tests: No results for input(s): "TSH", "T4TOTAL", "FREET4", "T3FREE", "THYROIDAB" in the last 72 hours. Anemia Panel: No results for input(s): "VITAMINB12", "FOLATE", "FERRITIN", "TIBC", "IRON", "RETICCTPCT" in the last 72 hours. Sepsis Labs: Recent Labs  Lab 02/27/22 1451 02/27/22 1744  LATICACIDVEN 3.3* 3.0*    No results found for this or any previous visit (from the past 240 hour(s)).       Radiology Studies: CT ANGIO GI BLEED  Result Date: 02/27/2022 CLINICAL DATA:  Dark stool for 3 days, dizziness, hypotension EXAM: CTA ABDOMEN AND PELVIS WITHOUT AND WITH CONTRAST TECHNIQUE: Multidetector CT imaging of the abdomen and pelvis was performed using the standard protocol during bolus administration of intravenous contrast. Multiplanar reconstructed images and MIPs were obtained and reviewed to evaluate the vascular anatomy. RADIATION DOSE REDUCTION: This exam was performed according to the departmental dose-optimization program which includes automated exposure control, adjustment of the mA and/or kV according to  patient size and/or use of iterative reconstruction technique. CONTRAST:  157m OMNIPAQUE IOHEXOL 350 MG/ML SOLN COMPARISON:  07/01/2021 FINDINGS: VASCULAR Aorta: There is mild diffuse atherosclerosis. Fusiform infrarenal abdominal aortic aneurysm measures up to 3.4 cm in maximal diameter, not appreciably changed since prior exam. No evidence of dissection or critical stenosis. Celiac: Patent without evidence of aneurysm, dissection, vasculitis or significant stenosis. Stable atherosclerosis and mild narrowing at the origin of the celiac axis is unchanged. SMA: Patent without evidence of aneurysm, dissection, vasculitis or significant stenosis. Stable atherosclerosis. Renals: There is an accessory left renal artery supplying the lower pole of the left kidney. There is moderate atherosclerosis at the origin of the main bilateral renal arteries, with less than 50% stenosis bilaterally. No evidence of dissection, vasculitis, or aneurysm. IMA: Patent without evidence of aneurysm, dissection, vasculitis or significant stenosis. Inflow: Patent without evidence of aneurysm, dissection, vasculitis or significant stenosis. Diffuse atherosclerosis. Proximal Outflow: Bilateral common femoral and visualized portions of the superficial and profunda femoral arteries are patent without evidence of aneurysm, dissection, vasculitis or significant stenosis. Diffuse atherosclerosis. Veins: No obvious venous abnormality  within the limitations of this arterial phase study. Review of the MIP images confirms the above findings. NON-VASCULAR Lower chest: No acute pleural or parenchymal lung disease. Hepatobiliary: Small gallstones layer dependently within the gallbladder, with no evidence of cholecystitis. No biliary duct dilation or evidence of choledocholithiasis. The liver is unremarkable. Pancreas: Unremarkable. No pancreatic ductal dilatation or surrounding inflammatory changes. Spleen: Normal in size without focal abnormality.  Adrenals/Urinary Tract: Adrenal glands are unremarkable. Kidneys are normal, without renal calculi, focal lesion, or hydronephrosis. Bladder is unremarkable. Stomach/Bowel: No bowel obstruction or ileus. There is diverticulosis of the sigmoid colon without evidence of acute diverticulitis. The appendix is likely surgically absent. No bowel wall thickening or inflammatory change. There is no evidence of intraluminal contrast accumulation within the bowel to suggest active gastrointestinal hemorrhage. Lymphatic: No pathologic adenopathy within the abdomen or pelvis. Reproductive: Prostate is unremarkable. Other: No free fluid or free intraperitoneal gas. No abdominal wall hernia. Musculoskeletal: No acute or destructive bony lesions. Reconstructed images demonstrate no additional findings. IMPRESSION: VASCULAR 1. No evidence of active gastrointestinal bleeding. 2. Stable fusiform infrarenal abdominal aortic aneurysm measuring up to 3.4 cm. Recommend follow-up every 3 years. Reference: J Am Coll Radiol 1950;93:267-124. 3.  Aortic Atherosclerosis (ICD10-I70.0). NON-VASCULAR 1. Cholelithiasis without cholecystitis. 2. Sigmoid diverticulosis without diverticulitis. Electronically Signed   By: Randa Ngo M.D.   On: 02/27/2022 16:51        Scheduled Meds:  sodium chloride   Intravenous Once   gabapentin  300 mg Oral Daily   gabapentin  600 mg Oral QHS   insulin aspart  0-5 Units Subcutaneous QHS   insulin aspart  0-9 Units Subcutaneous TID WC   mometasone-formoterol  2 puff Inhalation BID   pantoprazole (PROTONIX) IV  40 mg Intravenous Q12H   sertraline  50 mg Oral Daily   Continuous Infusions:  sodium chloride 100 mL/hr at 02/28/22 0739     LOS: 1 day    Time spent: 35 minutes    Barb Merino, MD [facility] Pager [phone]

## 2022-03-01 ENCOUNTER — Encounter (HOSPITAL_COMMUNITY): Payer: Self-pay | Admitting: Internal Medicine

## 2022-03-01 ENCOUNTER — Inpatient Hospital Stay (HOSPITAL_COMMUNITY): Payer: Medicare Other | Admitting: Certified Registered"

## 2022-03-01 ENCOUNTER — Encounter (HOSPITAL_COMMUNITY)
Admission: EM | Disposition: A | Discharge status: Home / Self Care | Payer: Self-pay | Source: Home / Self Care | Attending: Internal Medicine

## 2022-03-01 DIAGNOSIS — Z87891 Personal history of nicotine dependence: Secondary | ICD-10-CM

## 2022-03-01 DIAGNOSIS — K449 Diaphragmatic hernia without obstruction or gangrene: Secondary | ICD-10-CM

## 2022-03-01 DIAGNOSIS — I2510 Atherosclerotic heart disease of native coronary artery without angina pectoris: Secondary | ICD-10-CM

## 2022-03-01 DIAGNOSIS — I252 Old myocardial infarction: Secondary | ICD-10-CM

## 2022-03-01 DIAGNOSIS — I10 Essential (primary) hypertension: Secondary | ICD-10-CM

## 2022-03-01 DIAGNOSIS — K922 Gastrointestinal hemorrhage, unspecified: Secondary | ICD-10-CM | POA: Diagnosis not present

## 2022-03-01 DIAGNOSIS — D62 Acute posthemorrhagic anemia: Secondary | ICD-10-CM

## 2022-03-01 HISTORY — PX: ESOPHAGOGASTRODUODENOSCOPY: SHX5428

## 2022-03-01 LAB — GLUCOSE, CAPILLARY
Glucose-Capillary: 141 mg/dL — ABNORMAL HIGH (ref 70–99)
Glucose-Capillary: 134 mg/dL — ABNORMAL HIGH (ref 70–99)
Glucose-Capillary: 120 mg/dL — ABNORMAL HIGH (ref 70–99)
Glucose-Capillary: 149 mg/dL — ABNORMAL HIGH (ref 70–99)
Glucose-Capillary: 234 mg/dL — ABNORMAL HIGH (ref 70–99)

## 2022-03-01 LAB — HEMOGLOBIN AND HEMATOCRIT, BLOOD
HCT: 23.6 % — ABNORMAL LOW (ref 39.0–52.0)
HCT: 23.9 % — ABNORMAL LOW (ref 39.0–52.0)
Hemoglobin: 7.7 g/dL — ABNORMAL LOW (ref 13.0–17.0)
Hemoglobin: 7.7 g/dL — ABNORMAL LOW (ref 13.0–17.0)

## 2022-03-01 SURGERY — EGD (ESOPHAGOGASTRODUODENOSCOPY)
Anesthesia: Monitor Anesthesia Care

## 2022-03-01 MED ORDER — PROPOFOL 500 MG/50ML IV EMUL
INTRAVENOUS | Status: DC | PRN
  Administered 2022-03-01: 125 ug/kg/min via INTRAVENOUS

## 2022-03-01 MED ORDER — LACTATED RINGERS IV SOLN: INTRAVENOUS | Status: DC

## 2022-03-01 MED ORDER — PROPOFOL 10 MG/ML IV BOLUS
INTRAVENOUS | Status: DC | PRN
  Administered 2022-03-01: 30 mg via INTRAVENOUS

## 2022-03-01 MED ORDER — PANTOPRAZOLE SODIUM 40 MG PO TBEC
40.0000 mg | DELAYED_RELEASE_TABLET | Freq: Every day | ORAL | Status: DC
  Administered 2022-03-02: 40 mg via ORAL
  Filled 2022-03-01 (×2): qty 1

## 2022-03-01 MED ORDER — PROPOFOL 500 MG/50ML IV EMUL
INTRAVENOUS | Status: AC
  Filled 2022-03-01: qty 50

## 2022-03-01 MED ORDER — SODIUM CHLORIDE 0.9 % IV SOLN: INTRAVENOUS | Status: DC

## 2022-03-01 NOTE — Transfer of Care (Signed)
Immediate Anesthesia Transfer of Care Note  Patient: [name] Cook  Procedure(s) Performed: ESOPHAGOGASTRODUODENOSCOPY (EGD)  Patient Location: PACU  Anesthesia Type:MAC  Level of Consciousness: drowsy  Airway & Oxygen Therapy: Patient Spontanous Breathing and Patient connected to face mask oxygen  Post-op Assessment: Report given to RN, Post -op Vital signs reviewed and stable, and Patient moving all extremities X 4  Post vital signs: Reviewed and stable  Last Vitals:  Vitals Value Taken Time  BP 110/51   Temp    Pulse 72   Resp 12   SpO2 100     Last Pain:  Vitals:   03/01/22 1140  TempSrc: Temporal  PainSc: 6       Patients Stated Pain Goal: 3 (03/49/17 9150)  Complications: No notable events documented.

## 2022-03-01 NOTE — Op Note (Signed)
[facility] Patient Name: [name] Cook Procedure Date: 03/01/2022 MRN: [id number] Attending MD: Clarene Essex , MD, [id number] Date of Birth: February 21, 1951 CSN: [id number] Age: 71 Admit Type: Inpatient Procedure:                Upper GI endoscopy Indications:              Acute post hemorrhagic anemia, Melena Providers:                Clarene Essex, MD, Autumn Bonne Dolores Benetta Spar,                            Technician Referring MD:              Medicines:                Monitored Anesthesia Care Complications:            No immediate complications. Estimated Blood Loss:     Estimated blood loss: none. Estimated blood loss:                            none. Procedure:                Pre-Anesthesia Assessment:                           - Prior to the procedure, a History and Physical                            was performed, and patient medications and                            allergies were reviewed. The patient's tolerance of                            previous anesthesia was also reviewed. The risks                            and benefits of the procedure and the sedation                            options and risks were discussed with the patient.                            All questions were answered, and informed consent                            was obtained. Prior Anticoagulants: The patient has                            taken Coumadin (warfarin), last dose was 2 days                            prior to procedure. ASA Grade Assessment: III - A  patient with severe systemic disease. After                            reviewing the risks and benefits, the patient was                            deemed in satisfactory condition to undergo the                            procedure.                           After obtaining informed consent, the endoscope was                            passed under direct vision. Throughout the                             procedure, the patient's blood pressure, pulse, and                            oxygen saturations were monitored continuously. The                            [id number]-H190 ([id number] Olympus endoscope was introduced                            through the mouth, and advanced to the third part                            of duodenum. The upper GI endoscopy was                            accomplished without difficulty. The patient                            tolerated the procedure well. Scope In: Scope Out: Findings:      The larynx was normal.      A small hiatal hernia was present.      A few localized diminutive erosions with no stigmata of recent bleeding       were found in the gastric body.      The duodenal bulb, first portion of the duodenum, second portion of the       duodenum and third portion of the duodenum were normal.      The exam was otherwise without abnormality. Impression:               - Normal larynx.                           - Small hiatal hernia.                           - Erosive gastropathy with stigmata of recent  bleeding.                           - Normal duodenal bulb, first portion of the                            duodenum, second portion of the duodenum and third                            portion of the duodenum.                           - The examination was otherwise normal.                           - No specimens collected. Moderate Sedation:      Not Applicable - Patient had care per Anesthesia. Recommendation:           - Soft diet today. Continue once a day pump                            inhibitor                           - Continue present medications. We will need to                            discuss blood thinner needs with cardiology at home                            but would reevaluate whether he truly needs both                            Plavix and Coumadin going forward                           -  Return to GI clinic PRN. He has a Scientist, forensic who can see him in                            follow-up and decide if any further work-up or                            plans are needed like repeat colonoscopy even                            though one was done this year or capsule endoscopy                           - Telephone GI clinic if symptomatic PRN. We will  repeat labs in a.m. and check on tomorrow but                            please call us if we could be of any further                            assistance with this hospital stay Procedure Code(s):        --- Professional ---                           [phone], Esophagogastroduodenoscopy, flexible,                            transoral; diagnostic, including collection of                            specimen(s) by brushing or washing, when performed                            (separate procedure) Diagnosis Code(s):        --- Professional ---                           K44.9, Diaphragmatic hernia without obstruction or                            gangrene                           K92.2, Gastrointestinal hemorrhage, unspecified                           D62, Acute posthemorrhagic anemia                           K92.1, Melena (includes Hematochezia) CPT copyright 2022 [employer]. All rights reserved. The codes documented in this report are preliminary and upon coder review may  be revised to meet current compliance requirements. Clarene Essex, MD 03/01/2022 12:55:04 PM This report has been signed electronically. Number of Addenda: 0

## 2022-03-01 NOTE — Anesthesia Postprocedure Evaluation (Signed)
Anesthesia Post Note  Patient: [name] Cook  Procedure(s) Performed: ESOPHAGOGASTRODUODENOSCOPY (EGD)     Patient location during evaluation: PACU Anesthesia Type: MAC Level of consciousness: awake and alert Pain management: pain level controlled Vital Signs Assessment: post-procedure vital signs reviewed and stable Respiratory status: spontaneous breathing, nonlabored ventilation, respiratory function stable and patient connected to nasal cannula oxygen Cardiovascular status: stable and blood pressure returned to baseline Postop Assessment: no apparent nausea or vomiting Anesthetic complications: no  No notable events documented.  Last Vitals:  Vitals:   03/01/22 1305 03/01/22 1317  BP: (!) 123/49 (!) 116/59  Pulse: 70 70  Resp: 16 17  Temp:  36.5 C  SpO2: 98% 100%    Last Pain:  Vitals:   03/01/22 1317  TempSrc: Oral  PainSc:                  Tiajuana Amass

## 2022-03-01 NOTE — Progress Notes (Signed)
[name] Cook 12:10 PM  Subjective: Patient without any new complaints although he is hungry and did have 1 dark bowel movement yesterday and none today we rediscussed his procedure  Objective: Vital signs stable afebrile no acute distress and please see preassessment evaluation hemoglobin stable  Assessment: Melena in a patient on Plavix and Coumadin  Plan: Okay to proceed with endoscopy with anesthesia assistance  [facility]  office [phone] After 5PM or if no answer call [phone]

## 2022-03-01 NOTE — Anesthesia Preprocedure Evaluation (Signed)
Anesthesia Evaluation  Patient identified by MRN, date of birth, ID band Patient awake    Reviewed: Allergy & Precautions, NPO status , Patient's Chart, lab work & pertinent test results  Airway Mallampati: II  TM Distance: >3 FB Neck ROM: Full    Dental   Pulmonary sleep apnea , COPD, former smoker   breath sounds clear to auscultation       Cardiovascular hypertension, Pt. on medications + CAD, + Past MI and + Cardiac Stents  + dysrhythmias Atrial Fibrillation + pacemaker  Rhythm:Regular Rate:Normal     Neuro/Psych negative neurological ROS     GI/Hepatic Neg liver ROS,GERD  ,,GI bleed    Endo/Other  diabetes, Type 2    Renal/GU Renal disease     Musculoskeletal  (+) Arthritis ,    Abdominal   Peds  Hematology  (+) Blood dyscrasia, anemia   Anesthesia Other Findings   Reproductive/Obstetrics                             Anesthesia Physical Anesthesia Plan  ASA: 3  Anesthesia Plan: MAC   Post-op Pain Management:    Induction:   PONV Risk Score and Plan: 1 and Propofol infusion  Airway Management Planned: Natural Airway and Simple Face Mask  Additional Equipment:   Intra-op Plan:   Post-operative Plan:   Informed Consent: I have reviewed the patients History and Physical, chart, labs and discussed the procedure including the risks, benefits and alternatives for the proposed anesthesia with the patient or authorized representative who has indicated his/her understanding and acceptance.       Plan Discussed with:   Anesthesia Plan Comments:        Anesthesia Quick Evaluation

## 2022-03-01 NOTE — Progress Notes (Signed)
PROGRESS NOTE    [name] Cook  JXB:[id number] DOB: 1950/08/13 DOA: 02/27/2022 PCP: [name], NP    Brief Narrative:  71 year old gentleman with history of diverticulosis, persistent A-fib status post pacemaker on anticoagulation with Coumadin, coronary artery disease on Plavix, essential hypertension, type 2 diabetes presented with 3 days of abdominal pain and discomfort, melanotic stool.  Blood pressure 70s when encountered by EMS.  Profoundly symptomatic with orthostatic dizziness and lightheadedness.  Hemoglobin on arrival 9.7 with recent hemoglobin of 15.  CT angiogram with bleeding scan negative.  Admitted to the hospital.   Assessment & Plan:   Acute GI bleeding, suspect upper GI bleeding with melanotic stools in a patient who is coagulopathic on Coumadin and Plavix. Anemia of acute blood loss, significant symptomatic anemia.  Currently remains on Protonix IV twice daily.  Received total 2 units of PRBC and subsequently stabilizing.  Hemoglobin is 7.7.  Will monitor every 12 hours now. Continue to hold Coumadin and Plavix.  Will discuss with her cardiologist about monotherapy on discharge after EGD.  Essential hypertension: At risk of hypotension.  Hold all antihypertensives.  Type 2 diabetes with hyperglycemia: Well-controlled at home.  On SSI.  Chronic medical issues including Permanent A-fib, rate controlled Hyperlipidemia, hold statin CKD stage II, stable.   DVT prophylaxis: SCDs Start: 02/27/22 1829   Code Status: Full code Family Communication: None at the bedside Disposition Plan: Status is: Inpatient Remains inpatient appropriate because: Active GI bleeding, inpatient procedures anticipated     Consultants:  Gastroenterology  Procedures:  None  Antimicrobials:  None   Subjective: Seen and examined.  Abdomen has mostly improved.  He had 1 dark his stool last night.  Denies any nausea or vomiting.  No more orthostatic or  dizzy.  Objective: Vitals:   02/28/22 1254 02/28/22 1948 02/28/22 2046 03/01/22 0457  BP: (!) 114/52 (!) 122/57  124/61  Pulse: 72 72  71  Resp: '18 18  18  '$ Temp: 98.3 F (36.8 C) 97.6 F (36.4 C)  97.8 F (36.6 C)  TempSrc: Oral Oral  Oral  SpO2: 100% 100% 92% 100%  Weight:      Height:        Intake/Output Summary (Last 24 hours) at 03/01/2022 0957 Last data filed at 03/01/2022 0854 Gross per 24 hour  Intake 2823.4 ml  Output 3600 ml  Net -776.6 ml   Filed Weights   02/27/22 1448 02/28/22 0500  Weight: 96 kg 98.3 kg    Examination:  General exam: Appears calm and comfortable  Respiratory system: Clear to auscultation. Respiratory effort normal. Cardiovascular system: S1 & S2 heard, RRR. No JVD, murmurs, rubs, gallops or clicks. No pedal edema. Gastrointestinal system: Abdomen is nondistended, soft and nontender. No organomegaly or masses felt. Normal bowel sounds heard. Central nervous system: Alert and oriented. No focal neurological deficits.    Data Reviewed: I have personally reviewed following labs and imaging studies  CBC: Recent Labs  Lab 02/27/22 1451 02/27/22 1958 02/28/22 0724 02/28/22 1400 02/28/22 2036 03/01/22 0122 03/01/22 0724  WBC 11.8*  --   --   --   --   --   --   NEUTROABS 10.0*  --   --   --   --   --   --   HGB 9.7*   < > 7.2* 7.8* 8.2* 7.7* 7.7*  HCT 30.3*   < > 22.4* 23.8* 25.1* 23.6* 23.9*  MCV 101.7*  --   --   --   --   --   --  PLT 148*  --   --   --   --   --   --    < > = values in this interval not displayed.   Basic Metabolic Panel: Recent Labs  Lab 02/27/22 1451 02/28/22 0109  NA 138 137  K 5.0 4.3  CL 112* 114*  CO2 16* 17*  GLUCOSE 261* 211*  BUN 75* 75*  CREATININE 1.08 1.12  CALCIUM 9.0 8.7*  MG  --  1.9   GFR: Estimated Creatinine Clearance: 74.4 mL/min (by C-G formula based on SCr of 1.12 mg/dL). Liver Function Tests: Recent Labs  Lab 02/27/22 1451 02/28/22 0109  AST 14* 12*  ALT 13 11   ALKPHOS 33* 26*  BILITOT 0.8 0.6  PROT 5.3* 4.3*  ALBUMIN 3.3* 2.7*   No results for input(s): "LIPASE", "AMYLASE" in the last 168 hours. No results for input(s): "AMMONIA" in the last 168 hours. Coagulation Profile: Recent Labs  Lab 02/27/22 1451 02/28/22 0109  INR 2.8* 1.6*   Cardiac Enzymes: No results for input(s): "CKTOTAL", "CKMB", "CKMBINDEX", "TROPONINI" in the last 168 hours. BNP (last 3 results) No results for input(s): "PROBNP" in the last 8760 hours. HbA1C: No results for input(s): "HGBA1C" in the last 72 hours. CBG: Recent Labs  Lab 02/28/22 1134 02/28/22 1658 02/28/22 2142 03/01/22 0457 03/01/22 0748  GLUCAP 217* 145* 126* 141* 134*   Lipid Profile: No results for input(s): "CHOL", "HDL", "LDLCALC", "TRIG", "CHOLHDL", "LDLDIRECT" in the last 72 hours. Thyroid Function Tests: No results for input(s): "TSH", "T4TOTAL", "FREET4", "T3FREE", "THYROIDAB" in the last 72 hours. Anemia Panel: No results for input(s): "VITAMINB12", "FOLATE", "FERRITIN", "TIBC", "IRON", "RETICCTPCT" in the last 72 hours. Sepsis Labs: Recent Labs  Lab 02/27/22 1451 02/27/22 1744  LATICACIDVEN 3.3* 3.0*    No results found for this or any previous visit (from the past 240 hour(s)).       Radiology Studies: CT ANGIO GI BLEED  Result Date: 02/27/2022 CLINICAL DATA:  Dark stool for 3 days, dizziness, hypotension EXAM: CTA ABDOMEN AND PELVIS WITHOUT AND WITH CONTRAST TECHNIQUE: Multidetector CT imaging of the abdomen and pelvis was performed using the standard protocol during bolus administration of intravenous contrast. Multiplanar reconstructed images and MIPs were obtained and reviewed to evaluate the vascular anatomy. RADIATION DOSE REDUCTION: This exam was performed according to the departmental dose-optimization program which includes automated exposure control, adjustment of the mA and/or kV according to patient size and/or use of iterative reconstruction technique.  CONTRAST:  145m OMNIPAQUE IOHEXOL 350 MG/ML SOLN COMPARISON:  07/01/2021 FINDINGS: VASCULAR Aorta: There is mild diffuse atherosclerosis. Fusiform infrarenal abdominal aortic aneurysm measures up to 3.4 cm in maximal diameter, not appreciably changed since prior exam. No evidence of dissection or critical stenosis. Celiac: Patent without evidence of aneurysm, dissection, vasculitis or significant stenosis. Stable atherosclerosis and mild narrowing at the origin of the celiac axis is unchanged. SMA: Patent without evidence of aneurysm, dissection, vasculitis or significant stenosis. Stable atherosclerosis. Renals: There is an accessory left renal artery supplying the lower pole of the left kidney. There is moderate atherosclerosis at the origin of the main bilateral renal arteries, with less than 50% stenosis bilaterally. No evidence of dissection, vasculitis, or aneurysm. IMA: Patent without evidence of aneurysm, dissection, vasculitis or significant stenosis. Inflow: Patent without evidence of aneurysm, dissection, vasculitis or significant stenosis. Diffuse atherosclerosis. Proximal Outflow: Bilateral common femoral and visualized portions of the superficial and profunda femoral arteries are patent without evidence of aneurysm, dissection, vasculitis or significant stenosis.  Diffuse atherosclerosis. Veins: No obvious venous abnormality within the limitations of this arterial phase study. Review of the MIP images confirms the above findings. NON-VASCULAR Lower chest: No acute pleural or parenchymal lung disease. Hepatobiliary: Small gallstones layer dependently within the gallbladder, with no evidence of cholecystitis. No biliary duct dilation or evidence of choledocholithiasis. The liver is unremarkable. Pancreas: Unremarkable. No pancreatic ductal dilatation or surrounding inflammatory changes. Spleen: Normal in size without focal abnormality. Adrenals/Urinary Tract: Adrenal glands are unremarkable. Kidneys are  normal, without renal calculi, focal lesion, or hydronephrosis. Bladder is unremarkable. Stomach/Bowel: No bowel obstruction or ileus. There is diverticulosis of the sigmoid colon without evidence of acute diverticulitis. The appendix is likely surgically absent. No bowel wall thickening or inflammatory change. There is no evidence of intraluminal contrast accumulation within the bowel to suggest active gastrointestinal hemorrhage. Lymphatic: No pathologic adenopathy within the abdomen or pelvis. Reproductive: Prostate is unremarkable. Other: No free fluid or free intraperitoneal gas. No abdominal wall hernia. Musculoskeletal: No acute or destructive bony lesions. Reconstructed images demonstrate no additional findings. IMPRESSION: VASCULAR 1. No evidence of active gastrointestinal bleeding. 2. Stable fusiform infrarenal abdominal aortic aneurysm measuring up to 3.4 cm. Recommend follow-up every 3 years. Reference: J Am Coll Radiol 4259;56:387-564. 3.  Aortic Atherosclerosis (ICD10-I70.0). NON-VASCULAR 1. Cholelithiasis without cholecystitis. 2. Sigmoid diverticulosis without diverticulitis. Electronically Signed   By: Randa Ngo M.D.   On: 02/27/2022 16:51        Scheduled Meds:  gabapentin  300 mg Oral Daily   gabapentin  600 mg Oral QHS   insulin aspart  0-5 Units Subcutaneous QHS   insulin aspart  0-9 Units Subcutaneous TID WC   mometasone-formoterol  2 puff Inhalation BID   pantoprazole (PROTONIX) IV  40 mg Intravenous Q12H   sertraline  50 mg Oral Daily   Continuous Infusions:  sodium chloride 100 mL/hr at 02/28/22 2126   sodium chloride       LOS: 2 days    Time spent: 35 minutes    Barb Merino, MD [facility] Pager [phone]

## 2022-03-02 DIAGNOSIS — K922 Gastrointestinal hemorrhage, unspecified: Secondary | ICD-10-CM | POA: Diagnosis not present

## 2022-03-02 LAB — CBC WITH DIFFERENTIAL/PLATELET
Abs Immature Granulocytes: 0.03 10*3/uL (ref 0.00–0.07)
Basophils Absolute: 0 10*3/uL (ref 0.0–0.1)
Basophils Relative: 0 %
Eosinophils Absolute: 0.1 10*3/uL (ref 0.0–0.5)
Eosinophils Relative: 2 %
HCT: 21.4 % — ABNORMAL LOW (ref 39.0–52.0)
Hemoglobin: 7 g/dL — ABNORMAL LOW (ref 13.0–17.0)
Immature Granulocytes: 1 %
Lymphocytes Relative: 30 %
Lymphs Abs: 1.4 10*3/uL (ref 0.7–4.0)
MCH: 31.8 pg (ref 26.0–34.0)
MCHC: 32.7 g/dL (ref 30.0–36.0)
MCV: 97.3 fL (ref 80.0–100.0)
Monocytes Absolute: 0.4 10*3/uL (ref 0.1–1.0)
Monocytes Relative: 9 %
Neutro Abs: 2.6 10*3/uL (ref 1.7–7.7)
Neutrophils Relative %: 58 %
Platelets: 90 10*3/uL — ABNORMAL LOW (ref 150–400)
RBC: 2.2 MIL/uL — ABNORMAL LOW (ref 4.22–5.81)
RDW: 15.7 % — ABNORMAL HIGH (ref 11.5–15.5)
WBC: 4.5 10*3/uL (ref 4.0–10.5)
nRBC: 0 % (ref 0.0–0.2)

## 2022-03-02 LAB — GLUCOSE, CAPILLARY
Glucose-Capillary: 212 mg/dL — ABNORMAL HIGH (ref 70–99)
Glucose-Capillary: 158 mg/dL — ABNORMAL HIGH (ref 70–99)
Glucose-Capillary: 230 mg/dL — ABNORMAL HIGH (ref 70–99)
Glucose-Capillary: 159 mg/dL — ABNORMAL HIGH (ref 70–99)

## 2022-03-02 LAB — BASIC METABOLIC PANEL
Anion gap: 3 — ABNORMAL LOW (ref 5–15)
BUN: 26 mg/dL — ABNORMAL HIGH (ref 8–23)
CO2: 23 mmol/L (ref 22–32)
Calcium: 8.1 mg/dL — ABNORMAL LOW (ref 8.9–10.3)
Chloride: 115 mmol/L — ABNORMAL HIGH (ref 98–111)
Creatinine, Ser: 0.97 mg/dL (ref 0.61–1.24)
GFR, Estimated: >60 mL/min (ref >60)
Glucose, Bld: 135 mg/dL — ABNORMAL HIGH (ref 70–99)
Potassium: 3.8 mmol/L (ref 3.5–5.1)
Sodium: 141 mmol/L (ref 135–145)

## 2022-03-02 LAB — HEMOGLOBIN AND HEMATOCRIT, BLOOD
HCT: 24.9 % — ABNORMAL LOW (ref 39.0–52.0)
Hemoglobin: 8.1 g/dL — ABNORMAL LOW (ref 13.0–17.0)

## 2022-03-02 LAB — PREPARE RBC (CROSSMATCH)

## 2022-03-02 MED ORDER — SODIUM CHLORIDE 0.9% IV SOLUTION: Freq: Once | INTRAVENOUS | Status: AC

## 2022-03-02 NOTE — Care Management Important Message (Signed)
Important Message  Patient Details IM Letter given Name: [name] Cook MRN: [id number] Date of Birth: 08-Oct-1950   Medicare Important Message Given:  Yes     Kerin Salen 03/02/2022, 1:21 PM

## 2022-03-02 NOTE — Progress Notes (Signed)
PROGRESS NOTE    CASHTYN POULIOT  JFH:[id number] DOB: [birth year] DOA: 02/27/2022 PCP: [name], NP    Brief Narrative:  71 year old gentleman with history of diverticulosis, persistent A-fib status post pacemaker on anticoagulation with Coumadin, coronary artery disease on Plavix, essential hypertension, type 2 diabetes presented with 3 days of abdominal pain and discomfort, melanotic stool.  Blood pressure 70s when encountered by EMS.  Profoundly symptomatic with orthostatic dizziness and lightheadedness.  Hemoglobin on arrival 9.7 with recent hemoglobin of 15.  CT angiogram with bleeding scan negative.  Admitted to the hospital.   Assessment & Plan:   Acute GI bleeding, suspect upper GI bleeding with melanotic stools in a patient who is coagulopathic on Coumadin and Plavix. Anemia of acute blood loss, significant symptomatic anemia.  Patient was stabilized with IV fluids and blood transfusions. Hemoglobin 7 today, on fourth unit of PRBC today.  Recheck tomorrow. Underwent EGD and found to have erosive gastropathy but no active bleeding.  Changed to Protonix 40 mg daily. We will communicate with his cardiologist about anticoagulation and Plavix.  May likely need to be rechallenged with monotherapy in 1 to 2 weeks.  Essential hypertension: At risk of hypotension.  Still holding blood pressure medications.  Type 2 diabetes with hyperglycemia: Well-controlled at home.  On SSI.  Chronic medical issues including Permanent A-fib, rate controlled Hyperlipidemia, hold statin CKD stage II, stable.  1 more unit of blood transfusion today.  Total 4 units.  Work with PT OT, check orthostatic blood pressures.  Anticipate home tomorrow if remains stable.   DVT prophylaxis: SCDs Start: 02/27/22 1829   Code Status: Full code Family Communication: None at the bedside.  Offered to call family.  He will talk to his son. Disposition Plan: Status is: Inpatient Remains inpatient appropriate  because: Active GI bleeding, inpatient procedures anticipated     Consultants:  Gastroenterology  Procedures:  EGD 11/15  Antimicrobials:  None   Subjective: Patient seen and examined.  Denies any complaints but he has not walked.  He thinks he may still feel dizzy on walking.  No bowel movement since procedure.  Eating regular diet.  Objective: Vitals:   03/02/22 0821 03/02/22 0845 03/02/22 0850 03/02/22 0905  BP:  110/60  (!) 111/58  Pulse:  70    Resp:  '20 20 19  '$ Temp:  97.8 F (36.6 C)  97.9 F (36.6 C)  TempSrc:  Oral  Oral  SpO2: 100% 100%  100%  Weight:      Height:        Intake/Output Summary (Last 24 hours) at 03/02/2022 1125 Last data filed at 03/02/2022 0902 Gross per 24 hour  Intake 1020 ml  Output 3450 ml  Net -2430 ml   Filed Weights   02/27/22 1448 02/28/22 0500  Weight: 96 kg 98.3 kg    Examination:  General exam: Appears calm and comfortable  Respiratory system: Clear to auscultation. Respiratory effort normal. Cardiovascular system: S1 & S2 heard, RRR. No JVD, murmurs, rubs, gallops or clicks. No pedal edema. Gastrointestinal system: Abdomen is nondistended, soft and nontender. No organomegaly or masses felt. Normal bowel sounds heard. Central nervous system: Alert and oriented. No focal neurological deficits.    Data Reviewed: I have personally reviewed following labs and imaging studies  CBC: Recent Labs  Lab 02/27/22 1451 02/27/22 1958 02/28/22 1400 02/28/22 2036 03/01/22 0122 03/01/22 0724 03/02/22 0344  WBC 11.8*  --   --   --   --   --  4.5  NEUTROABS 10.0*  --   --   --   --   --  2.6  HGB 9.7*   < > 7.8* 8.2* 7.7* 7.7* 7.0*  HCT 30.3*   < > 23.8* 25.1* 23.6* 23.9* 21.4*  MCV 101.7*  --   --   --   --   --  97.3  PLT 148*  --   --   --   --   --  90*   < > = values in this interval not displayed.   Basic Metabolic Panel: Recent Labs  Lab 02/27/22 1451 02/28/22 0109 03/02/22 0344  NA 138 137 141  K 5.0 4.3 3.8   CL 112* 114* 115*  CO2 16* 17* 23  GLUCOSE 261* 211* 135*  BUN 75* 75* 26*  CREATININE 1.08 1.12 0.97  CALCIUM 9.0 8.7* 8.1*  MG  --  1.9  --    GFR: Estimated Creatinine Clearance: 85.9 mL/min (by C-G formula based on SCr of 0.97 mg/dL). Liver Function Tests: Recent Labs  Lab 02/27/22 1451 02/28/22 0109  AST 14* 12*  ALT 13 11  ALKPHOS 33* 26*  BILITOT 0.8 0.6  PROT 5.3* 4.3*  ALBUMIN 3.3* 2.7*   No results for input(s): "LIPASE", "AMYLASE" in the last 168 hours. No results for input(s): "AMMONIA" in the last 168 hours. Coagulation Profile: Recent Labs  Lab 02/27/22 1451 02/28/22 0109  INR 2.8* 1.6*   Cardiac Enzymes: No results for input(s): "CKTOTAL", "CKMB", "CKMBINDEX", "TROPONINI" in the last 168 hours. BNP (last 3 results) No results for input(s): "PROBNP" in the last 8760 hours. HbA1C: No results for input(s): "HGBA1C" in the last 72 hours. CBG: Recent Labs  Lab 03/01/22 0748 03/01/22 1115 03/01/22 1834 03/01/22 2051 03/02/22 0704  GLUCAP 134* 120* 149* 234* 212*   Lipid Profile: No results for input(s): "CHOL", "HDL", "LDLCALC", "TRIG", "CHOLHDL", "LDLDIRECT" in the last 72 hours. Thyroid Function Tests: No results for input(s): "TSH", "T4TOTAL", "FREET4", "T3FREE", "THYROIDAB" in the last 72 hours. Anemia Panel: No results for input(s): "VITAMINB12", "FOLATE", "FERRITIN", "TIBC", "IRON", "RETICCTPCT" in the last 72 hours. Sepsis Labs: Recent Labs  Lab 02/27/22 1451 02/27/22 1744  LATICACIDVEN 3.3* 3.0*    No results found for this or any previous visit (from the past 240 hour(s)).       Radiology Studies: No results found.      Scheduled Meds:  gabapentin  300 mg Oral Daily   gabapentin  600 mg Oral QHS   insulin aspart  0-5 Units Subcutaneous QHS   insulin aspart  0-9 Units Subcutaneous TID WC   mometasone-formoterol  2 puff Inhalation BID   pantoprazole  40 mg Oral Daily   sertraline  50 mg Oral Daily   Continuous  Infusions:  sodium chloride 100 mL/hr at 03/01/22 2135   lactated ringers Stopped (03/01/22 2054)     LOS: 3 days    Time spent: 35 minutes    Barb Merino, MD [facility] Pager [phone]

## 2022-03-02 NOTE — Progress Notes (Signed)
[facility] Progress Note  STACIE KNUTZEN 71 y.o. Feb 17, 1951  CC: Melena   Subjective: Patient's had no further stools at this time.  Denies nausea/vomiting.  Denies abdominal pain.  Tolerating diet without difficulty.  ROS : Review of Systems  Constitutional:  Negative for chills, fever and weight loss.  Gastrointestinal:  Negative for abdominal pain, blood in stool, constipation, diarrhea, heartburn, melena, nausea and vomiting.      Objective: Vital signs in last 24 hours: Vitals:   03/02/22 0850 03/02/22 0905  BP:  (!) 111/58  Pulse:    Resp: 20 19  Temp:  97.9 F (36.6 C)  SpO2:  100%    Physical Exam:  General:  Alert, cooperative, no distress, appears stated age  Head:  Normocephalic, without obvious abnormality, atraumatic  Eyes:  Anicteric sclera, EOM's intact, conjunctival pallor  Lungs:   Clear to auscultation bilaterally, respirations unlabored  Heart:  Regular rate and rhythm, S1, S2 normal  Abdomen:   Soft, non-tender, bowel sounds active all four quadrants,  no masses,     Lab Results: Recent Labs    02/28/22 0109 03/02/22 0344  NA 137 141  K 4.3 3.8  CL 114* 115*  CO2 17* 23  GLUCOSE 211* 135*  BUN 75* 26*  CREATININE 1.12 0.97  CALCIUM 8.7* 8.1*  MG 1.9  --    Recent Labs    02/27/22 1451 02/28/22 0109  AST 14* 12*  ALT 13 11  ALKPHOS 33* 26*  BILITOT 0.8 0.6  PROT 5.3* 4.3*  ALBUMIN 3.3* 2.7*   Recent Labs    02/27/22 1451 02/27/22 1958 03/01/22 0724 03/02/22 0344  WBC 11.8*  --   --  4.5  NEUTROABS 10.0*  --   --  2.6  HGB 9.7*   < > 7.7* 7.0*  HCT 30.3*   < > 23.9* 21.4*  MCV 101.7*  --   --  97.3  PLT 148*  --   --  90*   < > = values in this interval not displayed.   Recent Labs    02/27/22 1451 02/28/22 0109  LABPROT 28.9* 19.1*  INR 2.8* 1.6*      Assessment Melena -CTA shows no evidence of active GI bleeding.  Cholelithiasis.  Sigmoid diverticulosis. -Hgb 7.0 (7.7) -Platelets 90 -BUN 26,  improvement from 75.  Creatinine 0.97 -EGD 03/01/2022 showed small hiatal hernia, erosive gastropathy with stigmata of recent bleeding, normal duodenum, otherwise normal.  No specimens collected.  Plan: Melena likely result of erosive gastropathy seen on EGD.  Need to discuss risks versus benefits of continuing both Plavix and Coumadin with risk of recurrent bleeding.  No further bleeding at this time.  Improvement in BUN noted. Continue daily CBC and transfuse as needed to maintain HGB > 7  Continue PPI twice daily [facility] will follow  Garnette Scheuermann PA-C 03/02/2022, 10:47 AM  Contact #  [phone]

## 2022-03-02 NOTE — Progress Notes (Signed)
Pt complained of chest pain. RT assessed the Pt and he is clear and his O2 is 100% and HR 89. RT notified the nurse

## 2022-03-02 NOTE — Progress Notes (Signed)
Mobility Specialist - Progress Note   03/02/22 1515  Mobility  Activity Ambulated with assistance in hallway  Level of Assistance Standby assist, set-up cues, supervision of patient - no hands on  Assistive Device Front wheel walker  Distance Ambulated (ft) 100 ft  Range of Motion/Exercises Active  Activity Response Tolerated well  Mobility Referral Yes  $Mobility charge 1 Mobility   Pt was found in bed and agreeable to ambulate. During ambulation c/o SOB and at EOS returned to bed with all necessities in reach.  Ferd Hibbs Mobility Specialist

## 2022-03-03 ENCOUNTER — Encounter (HOSPITAL_COMMUNITY): Payer: Self-pay | Admitting: Gastroenterology

## 2022-03-03 DIAGNOSIS — K922 Gastrointestinal hemorrhage, unspecified: Secondary | ICD-10-CM | POA: Diagnosis not present

## 2022-03-03 LAB — CBC WITH DIFFERENTIAL/PLATELET
Abs Immature Granulocytes: 0.03 10*3/uL (ref 0.00–0.07)
Basophils Absolute: 0 10*3/uL (ref 0.0–0.1)
Basophils Relative: 0 %
Eosinophils Absolute: 0.2 10*3/uL (ref 0.0–0.5)
Eosinophils Relative: 4 %
HCT: 25.2 % — ABNORMAL LOW (ref 39.0–52.0)
Hemoglobin: 8.1 g/dL — ABNORMAL LOW (ref 13.0–17.0)
Immature Granulocytes: 1 %
Lymphocytes Relative: 29 %
Lymphs Abs: 1.3 10*3/uL (ref 0.7–4.0)
MCH: 31.3 pg (ref 26.0–34.0)
MCHC: 32.1 g/dL (ref 30.0–36.0)
MCV: 97.3 fL (ref 80.0–100.0)
Monocytes Absolute: 0.4 10*3/uL (ref 0.1–1.0)
Monocytes Relative: 8 %
Neutro Abs: 2.7 10*3/uL (ref 1.7–7.7)
Neutrophils Relative %: 58 %
Platelets: 105 10*3/uL — ABNORMAL LOW (ref 150–400)
RBC: 2.59 MIL/uL — ABNORMAL LOW (ref 4.22–5.81)
RDW: 17.1 % — ABNORMAL HIGH (ref 11.5–15.5)
WBC: 4.6 10*3/uL (ref 4.0–10.5)
nRBC: 0 % (ref 0.0–0.2)

## 2022-03-03 LAB — BPAM RBC
Blood Product Expiration Date: 202312102359
Blood Product Expiration Date: 202312102359
Blood Product Expiration Date: 202312152359
ISSUE DATE / TIME: 202311131716
ISSUE DATE / TIME: 202311140850
ISSUE DATE / TIME: 202311160840
Unit Type and Rh: 5100
Unit Type and Rh: 5100
Unit Type and Rh: 5100

## 2022-03-03 LAB — TYPE AND SCREEN
ABO/RH(D): O POS
Antibody Screen: NEGATIVE
Unit division: 0
Unit division: 0
Unit division: 0

## 2022-03-03 LAB — GLUCOSE, CAPILLARY: Glucose-Capillary: 174 mg/dL — ABNORMAL HIGH (ref 70–99)

## 2022-03-03 MED ORDER — PANTOPRAZOLE SODIUM 40 MG PO TBEC
40.0000 mg | DELAYED_RELEASE_TABLET | Freq: Two times a day (BID) | ORAL | Status: DC
  Administered 2022-03-03: 40 mg via ORAL
  Filled 2022-03-03: qty 1

## 2022-03-03 MED ORDER — PANTOPRAZOLE SODIUM 40 MG PO TBEC: 40.0000 mg | DELAYED_RELEASE_TABLET | Freq: Two times a day (BID) | ORAL | 2 refills | Status: DC

## 2022-03-03 NOTE — Discharge Summary (Signed)
Physician Discharge Summary  [name] Cook UXL:[id number] DOB: 03/30/1951 DOA: 02/27/2022  PCP: [name], NP  Admit date: 02/27/2022 Discharge date: 03/03/2022  Admitted From: Home Disposition: Home  Recommendations for Outpatient Follow-up:  Follow up with PCP in 1-2 weeks Please obtain BMP/CBC in one week 3.  Cardiology clinic will schedule follow-up with on 12/4.  Home Health: N/A Equipment/Devices: N/A  Discharge Condition: Stable CODE STATUS: Full code Diet recommendation: Low-salt and low-carb diet  Discharge summary: 71 year old gentleman with history of diverticulosis, persistent A-fib status post pacemaker on anticoagulation with Coumadin, coronary artery disease on Plavix, essential hypertension, type 2 diabetes presented with 3 days of abdominal pain and discomfort, melanotic stool.  Blood pressure 70s when encountered by EMS.  Profoundly symptomatic with orthostatic dizziness and lightheadedness.  Hemoglobin on arrival 9.7 with recent hemoglobin of 15.  CT angiogram with bleeding scan negative.  Admitted to the hospital acute GI bleeding.  Underwent 4 units of blood transfusions.  Stabilized.  # Acute GI bleeding, upper GI bleeding with melanotic stools in a patient who is coagulopathic on Coumadin and Plavix. Anemia of acute blood loss, significant symptomatic anemia.   Patient was stabilized with IV fluids and blood transfusions.  Received total 4 units of PRBC with stabilization of hemoglobin. underwent EGD and found to have erosive gastropathy but no active bleeding.  Changed to Protonix 40 mg twice daily on discharge. Discussed with EP cardiology.  Decided to resume his Plavix but continue to hold Coumadin until clinic follow-up.   Essential hypertension: Blood pressure stabilized.  Go back on antihypertensives.   Type 2 diabetes with hyperglycemia: Diet at home.   Chronic medical issues including Permanent A-fib, rate controlled Hyperlipidemia,.  Resume  statin. CKD stage II, stable.   Stabilized.  Mobilized in the hallway with no orthostatic blood pressure changes or symptoms.  He stable to go home.  I have called and communicated with his EP cardiology office and decided to hold Coumadin until follow-up.  Discharge Diagnoses:  Principal Problem:   Upper GI bleeding Active Problems:   GASTROESOPHAGEAL REFLUX DISEASE   Atrial fibrillation   Hypertension   Hyperlipidemia   Type 2 diabetes mellitus with hyperglycemia (HCC)   Chronic kidney disease, stage II (mild)   Long term current use of anticoagulant therapy   Coronary artery disease   OSA (obstructive sleep apnea)   Polyp of colon    Discharge Instructions  Discharge Instructions     Call MD for:  difficulty breathing, headache or visual disturbances   Complete by: As directed    Call MD for:  extreme fatigue   Complete by: As directed    Call MD for:  persistant dizziness or light-headedness   Complete by: As directed    Diet - low sodium heart healthy   Complete by: As directed    Diet Carb Modified   Complete by: As directed    Discharge instructions   Complete by: As directed    Do not take Coumadin until seen by cardiology and follow-up.  You can resume all further medications.   Increase activity slowly   Complete by: As directed       Allergies as of 03/03/2022       Reactions   Januvia [sitagliptin] Other (See Comments)   Elevated lipase   Metformin And Related Diarrhea        Medication List     STOP taking these medications    cetirizine 10 MG tablet Commonly known  as: ZYRTEC   diclofenac Sodium 1 % Gel Commonly known as: Voltaren   warfarin 5 MG tablet Commonly known as: COUMADIN       TAKE these medications    albuterol 108 (90 Base) MCG/ACT inhaler Commonly known as: VENTOLIN HFA INHALE 2 PUFFS BY MOUTH EVERY 4 HOURS AS NEEDED FOR WHEEZE OR FOR SHORTNESS OF BREATH What changed: See the new instructions.   blood glucose  meter kit and supplies Kit Dispense based on patient and insurance preference. Use up to four times daily as directed. (FOR ICD-9 250.00, 250.01).   clopidogrel 75 MG tablet Commonly known as: PLAVIX TAKE 1 TABLET BY MOUTH EVERY DAY   fish oil-omega-3 fatty acids 1000 MG capsule Take 2 g by mouth daily.   fluticasone 50 MCG/ACT nasal spray Commonly known as: FLONASE PLACE 1 SPRAY INTO BOTH NOSTRILS DAILY AS NEEDED FOR ALLERGIES OR RHINITIS. What changed:  how much to take how to take this when to take this reasons to take this additional instructions   furosemide 20 MG tablet Commonly known as: LASIX TAKE 1 TABLET BY MOUTH EVERY DAY What changed: when to take this   gabapentin 300 MG capsule Commonly known as: NEURONTIN TAKE 1 CAPSULE BY MOUTH EVERY MORNING AND 2 CAPSULES BY MOUTH AT NIGHT FOR NEUROPATHY What changed: See the new instructions.   glipiZIDE 10 MG 24 hr tablet Commonly known as: GLUCOTROL XL TAKE 1 TABLET (10 MG TOTAL) BY MOUTH DAILY WITH BREAKFAST. FOR DIABETES.   glucose blood test strip 1 each by Other route as needed for other. Use to check blood sugar up to three times a day-Accu-Chek meter   isosorbide mononitrate 60 MG 24 hr tablet Commonly known as: IMDUR TAKE 1 TABLET BY MOUTH EVERY DAY   Jardiance 25 MG Tabs tablet Generic drug: empagliflozin TAKE 1 TABLET (25 MG TOTAL) BY MOUTH DAILY BEFORE BREAKFAST. FOR DIABETES. What changed: See the new instructions.   lisinopril 2.5 MG tablet Commonly known as: ZESTRIL TAKE 1 TABLET BY MOUTH EVERY DAY What changed: when to take this   nitroGLYCERIN 0.4 MG SL tablet Commonly known as: NITROSTAT Place 1 tablet (0.4 mg total) under the tongue every 5 (five) minutes as needed for chest pain.   pantoprazole 40 MG tablet Commonly known as: PROTONIX Take 1 tablet (40 mg total) by mouth 2 (two) times daily. What changed:  when to take this additional instructions   rosuvastatin 20 MG tablet Commonly  known as: CRESTOR TAKE 1 TABLET BY MOUTH EVERY DAY IN THE EVENING FOR CHOLESTEROL What changed: See the new instructions.   sertraline 50 MG tablet Commonly known as: ZOLOFT TAKE 1 TABLET (50 MG TOTAL) BY MOUTH DAILY. FOR ANXIETY AND DEPRESSION.   Symbicort 160-4.5 MCG/ACT inhaler Generic drug: budesonide-formoterol INHALE 2 PUFFS INTO THE LUNGS TWICE A DAY What changed: See the new instructions.   tamsulosin 0.4 MG Caps capsule Commonly known as: FLOMAX TAKE 1 CAPSULE (0.4 MG TOTAL) BY MOUTH 2 (TWO) TIMES DAILY. What changed: when to take this   TYLENOL 500 MG tablet Generic drug: acetaminophen Take 500-1,000 mg by mouth every 6 (six) hours as needed for mild pain or headache.   vitamin C 1000 MG tablet Take 2,000 mg by mouth daily.        Allergies  Allergen Reactions   Januvia [Sitagliptin] Other (See Comments)    Elevated lipase   Metformin And Related Diarrhea    Consultations: Gastroenterology   Procedures/Studies: CT ANGIO GI BLEED  Result Date: 02/27/2022 CLINICAL DATA:  Dark stool for 3 days, dizziness, hypotension EXAM: CTA ABDOMEN AND PELVIS WITHOUT AND WITH CONTRAST TECHNIQUE: Multidetector CT imaging of the abdomen and pelvis was performed using the standard protocol during bolus administration of intravenous contrast. Multiplanar reconstructed images and MIPs were obtained and reviewed to evaluate the vascular anatomy. RADIATION DOSE REDUCTION: This exam was performed according to the departmental dose-optimization program which includes automated exposure control, adjustment of the mA and/or kV according to patient size and/or use of iterative reconstruction technique. CONTRAST:  173m OMNIPAQUE IOHEXOL 350 MG/ML SOLN COMPARISON:  07/01/2021 FINDINGS: VASCULAR Aorta: There is mild diffuse atherosclerosis. Fusiform infrarenal abdominal aortic aneurysm measures up to 3.4 cm in maximal diameter, not appreciably changed since prior exam. No evidence of  dissection or critical stenosis. Celiac: Patent without evidence of aneurysm, dissection, vasculitis or significant stenosis. Stable atherosclerosis and mild narrowing at the origin of the celiac axis is unchanged. SMA: Patent without evidence of aneurysm, dissection, vasculitis or significant stenosis. Stable atherosclerosis. Renals: There is an accessory left renal artery supplying the lower pole of the left kidney. There is moderate atherosclerosis at the origin of the main bilateral renal arteries, with less than 50% stenosis bilaterally. No evidence of dissection, vasculitis, or aneurysm. IMA: Patent without evidence of aneurysm, dissection, vasculitis or significant stenosis. Inflow: Patent without evidence of aneurysm, dissection, vasculitis or significant stenosis. Diffuse atherosclerosis. Proximal Outflow: Bilateral common femoral and visualized portions of the superficial and profunda femoral arteries are patent without evidence of aneurysm, dissection, vasculitis or significant stenosis. Diffuse atherosclerosis. Veins: No obvious venous abnormality within the limitations of this arterial phase study. Review of the MIP images confirms the above findings. NON-VASCULAR Lower chest: No acute pleural or parenchymal lung disease. Hepatobiliary: Small gallstones layer dependently within the gallbladder, with no evidence of cholecystitis. No biliary duct dilation or evidence of choledocholithiasis. The liver is unremarkable. Pancreas: Unremarkable. No pancreatic ductal dilatation or surrounding inflammatory changes. Spleen: Normal in size without focal abnormality. Adrenals/Urinary Tract: Adrenal glands are unremarkable. Kidneys are normal, without renal calculi, focal lesion, or hydronephrosis. Bladder is unremarkable. Stomach/Bowel: No bowel obstruction or ileus. There is diverticulosis of the sigmoid colon without evidence of acute diverticulitis. The appendix is likely surgically absent. No bowel wall  thickening or inflammatory change. There is no evidence of intraluminal contrast accumulation within the bowel to suggest active gastrointestinal hemorrhage. Lymphatic: No pathologic adenopathy within the abdomen or pelvis. Reproductive: Prostate is unremarkable. Other: No free fluid or free intraperitoneal gas. No abdominal wall hernia. Musculoskeletal: No acute or destructive bony lesions. Reconstructed images demonstrate no additional findings. IMPRESSION: VASCULAR 1. No evidence of active gastrointestinal bleeding. 2. Stable fusiform infrarenal abdominal aortic aneurysm measuring up to 3.4 cm. Recommend follow-up every 3 years. Reference: J Am Coll Radiol 27564;33:295-188 3.  Aortic Atherosclerosis (ICD10-I70.0). NON-VASCULAR 1. Cholelithiasis without cholecystitis. 2. Sigmoid diverticulosis without diverticulitis. Electronically Signed   By: MRanda NgoM.D.   On: 02/27/2022 16:51   (Echo, Carotid, EGD, Colonoscopy, ERCP)    Subjective: Patient seen and examined.  Today denies any complaints.  Denies any chest pain shortness of breath.  He was asking question about gallbladder stone.  He does have cholelithiasis but is likely presentation unrelated to gallbladder stone.  We discussed about findings.  He mobilized in the hallway and did pretty well.  Has adequate support system at home.   Discharge Exam: Vitals:   03/03/22 0500 03/03/22 0555  BP: 122/89 121/68  Pulse: 79 72  Resp: 18 19  Temp: 98.9 F (37.2 C) 97.8 F (36.6 C)  SpO2: 98% 100%   Vitals:   03/02/22 2109 03/02/22 2234 03/03/22 0500 03/03/22 0555  BP:  119/68 122/89 121/68  Pulse:  73 79 72  Resp:  _0 Temp:  97.7 F (36.5 C) 98.9 F (37.2 C) 97.8 F (36.6 C)  TempSrc:   Oral Oral  SpO2: 97% 100% 98% 100%  Weight:   99 kg   Height:        General: Pt is alert, awake, not in acute distress Cardiovascular: RRR, S1/S2 +, no rubs, no gallops Pacemaker left precordium. Respiratory: CTA bilaterally, no  wheezing, no rhonchi Abdominal: Soft, NT, ND, bowel sounds +, obese and pendulous.  Nontender. Extremities: no edema, no cyanosis    The results of significant diagnostics from this hospitalization (including imaging, microbiology, ancillary and laboratory) are listed below for reference.     Microbiology: No results found for this or any previous visit (from the past 240 hour(s)).   Labs: BNP (last 3 results) No results for input(s): "BNP" in the last 8760 hours. Basic Metabolic Panel: Recent Labs  Lab 02/27/22 1451 02/28/22 0109 03/02/22 0344  NA 138 137 141  K 5.0 4.3 3.8  CL 112* 114* 115*  CO2 16* 17* 23  GLUCOSE 261* 211* 135*  BUN 75* 75* 26*  CREATININE 1.08 1.12 0.97  CALCIUM 9.0 8.7* 8.1*  MG  --  1.9  --    Liver Function Tests: Recent Labs  Lab 02/27/22 1451 02/28/22 0109  AST 14* 12*  ALT 13 11  ALKPHOS 33* 26*  BILITOT 0.8 0.6  PROT 5.3* 4.3*  ALBUMIN 3.3* 2.7*   No results for input(s): "LIPASE", "AMYLASE" in the last 168 hours. No results for input(s): "AMMONIA" in the last 168 hours. CBC: Recent Labs  Lab 02/27/22 1451 02/27/22 1958 03/01/22 0122 03/01/22 0724 03/02/22 0344 03/02/22 1333 03/03/22 0412  WBC 11.8*  --   --   --  4.5  --  4.6  NEUTROABS 10.0*  --   --   --  2.6  --  2.7  HGB 9.7*   < > 7.7* 7.7* 7.0* 8.1* 8.1*  HCT 30.3*   < > 23.6* 23.9* 21.4* 24.9* 25.2*  MCV 101.7*  --   --   --  97.3  --  97.3  PLT 148*  --   --   --  90*  --  105*   < > = values in this interval not displayed.   Cardiac Enzymes: No results for input(s): "CKTOTAL", "CKMB", "CKMBINDEX", "TROPONINI" in the last 168 hours. BNP: Invalid input(s): "POCBNP" CBG: Recent Labs  Lab 03/02/22 0704 03/02/22 1140 03/02/22 1651 03/02/22 2052 03/03/22 1138  GLUCAP 212* 158* 230* 159* 174*   D-Dimer No results for input(s): "DDIMER" in the last 72 hours. Hgb A1c No results for input(s): "HGBA1C" in the last 72 hours. Lipid Profile No results for  input(s): "CHOL", "HDL", "LDLCALC", "TRIG", "CHOLHDL", "LDLDIRECT" in the last 72 hours. Thyroid function studies No results for input(s): "TSH", "T4TOTAL", "T3FREE", "THYROIDAB" in the last 72 hours.  Invalid input(s): "FREET3" Anemia work up No results for input(s): "VITAMINB12", "FOLATE", "FERRITIN", "TIBC", "IRON", "RETICCTPCT" in the last 72 hours. Urinalysis    Component Value Date/Time   COLORURINE YELLOW (A) 01/23/2020 2136   APPEARANCEUR CLEAR (A) 01/23/2020 2136   APPEARANCEUR Clear 11/20/2017 1026   LABSPEC 1.024 01/23/2020 2136   LABSPEC 1.011 01/23/2013  [place] 6.0 01/23/2020 2136   GLUCOSEU >=500 (A) 01/23/2020 2136   GLUCOSEU Negative 01/23/2013 0553   HGBUR NEGATIVE 01/23/2020 2136   BILIRUBINUR NEGATIVE 01/23/2020 2136   BILIRUBINUR Negative 11/20/2017 1026   BILIRUBINUR Negative 01/23/2013 [place] 01/23/2020 2136   PROTEINUR NEGATIVE 01/23/2020 2136   NITRITE NEGATIVE 01/23/2020 2136   LEUKOCYTESUR NEGATIVE 01/23/2020 2136   LEUKOCYTESUR Negative 01/23/2013 0553   Sepsis Labs Recent Labs  Lab 02/27/22 1451 03/02/22 0344 03/03/22 0412  WBC 11.8* 4.5 4.6   Microbiology No results found for this or any previous visit (from the past 240 hour(s)).   Time coordinating discharge: 32 minutes  SIGNED:   Barb Merino, MD  [facility] 03/03/2022, 11:41 AM

## 2022-03-03 NOTE — Evaluation (Signed)
Physical Therapy One Time Evaluation Patient Details Name: [name] Cook MRN: [id number] DOB: [birth year] Today's Date: 03/03/2022  History of Present Illness  Pt is a 71 year old man admitted on 11/13 with GIB and orthostatic hypotension. Pt has received 4 units of blood this hospitalization. PMH: CAD, DM, afib, pacemaker, diverticulosis, COPD, HTN.  Clinical Impression  Patient evaluated by Physical Therapy with no further acute PT needs identified. All education has been completed and the patient has no further questions.  Pt able to ambulate in hallway good distance and denies dizziness today but reports mild dyspnea.  Pt lives with significant other and her mother.  Pt also has RW if needed upon d/c.  See below for any follow-up Physical Therapy or equipment needs. PT is signing off. Thank you for this referral.        Recommendations for follow up therapy are one component of a multi-disciplinary discharge planning process, led by the attending physician.  Recommendations may be updated based on patient status, additional functional criteria and insurance authorization.  Follow Up Recommendations No PT follow up      Assistance Recommended at Discharge PRN  Patient can return home with the following  Assistance with cooking/housework;Help with stairs or ramp for entrance    Equipment Recommendations None recommended by PT  Recommendations for Other Services       Functional Status Assessment Patient has had a recent decline in their functional status and demonstrates the ability to make significant improvements in function in a reasonable and predictable amount of time.     Precautions / Restrictions Precautions Precautions: Fall Restrictions Weight Bearing Restrictions: No      Mobility  Bed Mobility Overal bed mobility: Modified Independent                  Transfers Overall transfer level: Modified independent                       Ambulation/Gait Ambulation/Gait assistance: Supervision Gait Distance (Feet): 400 Feet Assistive device: None Gait Pattern/deviations: Step-through pattern, Decreased stride length       General Gait Details: initially seeking stability with bed rail and hand rail in hallway however improved with distance; pt denies dizziness but reports 2/4 dyspnea: SPO2 100% on room air and HR 80 bpm upon return to room  Stairs            Wheelchair Mobility    Modified Rankin (Stroke Patients Only)       Balance             Standing balance-Leahy Scale: Good                               Pertinent Vitals/Pain Pain Assessment Pain Assessment: No/denies pain    Home Living Family/patient expects to be discharged to:: Private residence Living Arrangements: Spouse/significant other;Other relatives (girlfriend's mother) Available Help at Discharge: Friend(s);Available 24 hours/day Type of Home: Mobile home Home Access: Stairs to enter Entrance Stairs-Rails: Right;Left;Can reach both Entrance Stairs-Number of Steps: 4   Home Layout: One level Home Equipment: Shower seat - built Medical sales representative (2 wheels)      Prior Function Prior Level of Function : Independent/Modified Independent                     Hand Dominance   Dominant Hand: Right    Extremity/Trunk Assessment   Upper  Extremity Assessment Upper Extremity Assessment: Overall WFL for tasks assessed    Lower Extremity Assessment Lower Extremity Assessment: Overall WFL for tasks assessed    Cervical / Trunk Assessment Cervical / Trunk Assessment: Normal  Communication   Communication: No difficulties  Cognition Arousal/Alertness: Awake/alert Behavior During Therapy: WFL for tasks assessed/performed Overall Cognitive Status: Within Functional Limits for tasks assessed                                          General Comments      Exercises      Assessment/Plan    PT Assessment Patient does not need any further PT services  PT Problem List         PT Treatment Interventions      PT Goals (Current goals can be found in the Care Plan section)  Acute Rehab PT Goals PT Goal Formulation: All assessment and education complete, DC therapy    Frequency       Co-evaluation               AM-PAC PT "6 Clicks" Mobility  Outcome Measure Help needed turning from your back to your side while in a flat bed without using bedrails?: None Help needed moving from lying on your back to sitting on the side of a flat bed without using bedrails?: None Help needed moving to and from a bed to a chair (including a wheelchair)?: None Help needed standing up from a chair using your arms (e.g., wheelchair or bedside chair)?: None Help needed to walk in hospital room?: A Little Help needed climbing 3-5 steps with a railing? : A Little 6 Click Score: 22    End of Session Equipment Utilized During Treatment: Gait belt Activity Tolerance: Patient tolerated treatment well Patient left: in chair;with call bell/phone within reach   PT Visit Diagnosis: Difficulty in walking, not elsewhere classified (R26.2)    Time: 2119-4174 PT Time Calculation (min) (ACUTE ONLY): 12 min   Charges:   PT Evaluation $PT Eval Low Complexity: 1 Low         Kati PT, DPT Physical Therapist Acute Rehabilitation Services Preferred contact method: Secure Chat Weekend Pager Only: [phone] Office: [place] 03/03/2022, 11:27 AM

## 2022-03-03 NOTE — Evaluation (Signed)
Occupational Therapy Evaluation Patient Details Name: [name] Cook MRN: [id number] DOB: Mar 12, 1951 Today's Date: 03/03/2022   History of Present Illness Pt is a 71 year old man admitted on 11/13 with GIB and orthostatic hypotension. Pt has received 4 units of blood this hospitalization. PMH: CAD, DM, afib, pacemaker, diverticulosis, COPD, HTN.   Clinical Impression   Pt is functioning at a supervision level in ADLs and mobility with RW. Pt does not typically use AD, but reports feeling more confident with it. Will follow acutely. Do not anticipate pt will need post acute OT.      Recommendations for follow up therapy are one component of a multi-disciplinary discharge planning process, led by the attending physician.  Recommendations may be updated based on patient status, additional functional criteria and insurance authorization.   Follow Up Recommendations  No OT follow up     Assistance Recommended at Discharge Intermittent Supervision/Assistance  Patient can return home with the following A little help with walking and/or transfers;A little help with bathing/dressing/bathroom    Functional Status Assessment  Patient has had a recent decline in their functional status and demonstrates the ability to make significant improvements in function in a reasonable and predictable amount of time.  Equipment Recommendations  None recommended by OT    Recommendations for Other Services       Precautions / Restrictions Precautions Precautions: Fall Restrictions Weight Bearing Restrictions: No      Mobility Bed Mobility Overal bed mobility: Modified Independent                  Transfers Overall transfer level: Needs assistance Equipment used: Rolling walker (2 wheels) Transfers: Sit to/from Stand Sit to Stand: Supervision           General transfer comment: for safety, slow to rise, seeks stability      Balance Overall balance assessment: Needs assistance    Sitting balance-Leahy Scale: Normal       Standing balance-Leahy Scale: Fair                             ADL either performed or assessed with clinical judgement   ADL Overall ADL's : Needs assistance/impaired Eating/Feeding: Independent   Grooming: Standing;Wash/dry hands;Supervision/safety   Upper Body Bathing: Set up;Sitting   Lower Body Bathing: Supervison/ safety;Sit to/from stand   Upper Body Dressing : Set up;Sitting   Lower Body Dressing: Supervision/safety;Sit to/from stand   Toilet Transfer: Supervision/safety;Ambulation;Rolling walker (2 wheels)           Functional mobility during ADLs: Supervision/safety;Rolling walker (2 wheels)       Vision Ability to See in Adequate Light: 0 Adequate       Perception     Praxis      Pertinent Vitals/Pain Pain Assessment Pain Assessment: No/denies pain     Hand Dominance Right   Extremity/Trunk Assessment Upper Extremity Assessment Upper Extremity Assessment: Overall WFL for tasks assessed   Lower Extremity Assessment Lower Extremity Assessment: Defer to PT evaluation   Cervical / Trunk Assessment Cervical / Trunk Assessment: Normal   Communication Communication Communication: No difficulties   Cognition Arousal/Alertness: Awake/alert Behavior During Therapy: WFL for tasks assessed/performed Overall Cognitive Status: Within Functional Limits for tasks assessed  General Comments       Exercises     Shoulder Instructions      Home Living Family/patient expects to be discharged to:: Private residence Living Arrangements: Spouse/significant other;Other relatives (girlfriend's mother) Available Help at Discharge: Friend(s);Available 24 hours/day Type of Home: Mobile home Home Access: Stairs to enter Entrance Stairs-Number of Steps: 4 Entrance Stairs-Rails: Right;Left;Can reach both Home Layout: One level     Bathroom  Shower/Tub: Occupational psychologist: Standard     Home Equipment: Shower seat - built Medical sales representative (2 wheels)          Prior Functioning/Environment Prior Level of Function : Independent/Modified Independent                        OT Problem List: Impaired balance (sitting and/or standing);Decreased strength      OT Treatment/Interventions: Self-care/ADL training;DME and/or AE instruction;Balance training;Patient/family education    OT Goals(Current goals can be found in the care plan section) Acute Rehab OT Goals OT Goal Formulation: With patient Time For Goal Achievement: 03/17/22 Potential to Achieve Goals: Good  OT Frequency: Min 2X/week    Co-evaluation              AM-PAC OT "6 Clicks" Daily Activity     Outcome Measure Help from another person eating meals?: None Help from another person taking care of personal grooming?: A Little Help from another person toileting, which includes using toliet, bedpan, or urinal?: A Little Help from another person bathing (including washing, rinsing, drying)?: A Little Help from another person to put on and taking off regular upper body clothing?: None Help from another person to put on and taking off regular lower body clothing?: A Little 6 Click Score: 20   End of Session    Activity Tolerance: Patient tolerated treatment well Patient left: in bed;with call bell/phone within reach;with bed alarm set  OT Visit Diagnosis: Unsteadiness on feet (R26.81);Other abnormalities of gait and mobility (R26.89)                Time: 0912-0929 OT Time Calculation (min): 17 min Charges:  OT General Charges $OT Visit: 1 Visit OT Evaluation $OT Eval Low Complexity: [place], OTR/L Acute Rehabilitation Services Office: [phone]   Malka So 03/03/2022, 9:39 AM

## 2022-03-03 NOTE — Plan of Care (Signed)
  Problem: Education: Goal: Ability to describe self-care measures that may prevent or decrease complications (Diabetes Survival Skills Education) will improve Outcome: Completed/Met Goal: Individualized Educational Video(s) Outcome: Completed/Met   

## 2022-03-07 ENCOUNTER — Telehealth: Payer: Self-pay

## 2022-03-07 NOTE — Patient Outreach (Signed)
  Care Coordination [facility] Note Transition Care Management Unsuccessful Follow-up Telephone Call  Date of discharge and from where:  03/03/22[facility]  Attempts:  1st Attempt  Reason for unsuccessful TCM follow-up call:  No answer/busy   Enzo Montgomery, RN,BSN,CCM [place] Management Telephonic Care Management Coordinator Direct Phone: [phone] Toll Free: [phone] Fax: [phone]

## 2022-03-08 ENCOUNTER — Emergency Department (HOSPITAL_COMMUNITY): Payer: Medicare Other

## 2022-03-08 ENCOUNTER — Inpatient Hospital Stay (HOSPITAL_COMMUNITY)
Admission: EM | Admit: 2022-03-08 | Discharge: 2022-03-12 | DRG: 377 | Disposition: A | Discharge status: Home / Self Care | Payer: Medicare Other | Attending: Internal Medicine | Admitting: Internal Medicine

## 2022-03-08 ENCOUNTER — Other Ambulatory Visit: Payer: Self-pay

## 2022-03-08 ENCOUNTER — Telehealth: Payer: Self-pay

## 2022-03-08 ENCOUNTER — Observation Stay (HOSPITAL_COMMUNITY): Payer: Medicare Other

## 2022-03-08 ENCOUNTER — Encounter (HOSPITAL_COMMUNITY): Payer: Self-pay | Admitting: Radiology

## 2022-03-08 DIAGNOSIS — J439 Emphysema, unspecified: Secondary | ICD-10-CM | POA: Diagnosis not present

## 2022-03-08 DIAGNOSIS — Z950 Presence of cardiac pacemaker: Secondary | ICD-10-CM

## 2022-03-08 DIAGNOSIS — R531 Weakness: Secondary | ICD-10-CM | POA: Diagnosis not present

## 2022-03-08 DIAGNOSIS — I4819 Other persistent atrial fibrillation: Secondary | ICD-10-CM | POA: Diagnosis not present

## 2022-03-08 DIAGNOSIS — I82812 Embolism and thrombosis of superficial veins of left lower extremities: Secondary | ICD-10-CM | POA: Diagnosis not present

## 2022-03-08 DIAGNOSIS — E1165 Type 2 diabetes mellitus with hyperglycemia: Secondary | ICD-10-CM | POA: Diagnosis not present

## 2022-03-08 DIAGNOSIS — D649 Anemia, unspecified: Secondary | ICD-10-CM | POA: Diagnosis not present

## 2022-03-08 DIAGNOSIS — Z6830 Body mass index (BMI) 30.0-30.9, adult: Secondary | ICD-10-CM

## 2022-03-08 DIAGNOSIS — K5900 Constipation, unspecified: Secondary | ICD-10-CM | POA: Diagnosis not present

## 2022-03-08 DIAGNOSIS — Z86718 Personal history of other venous thrombosis and embolism: Secondary | ICD-10-CM

## 2022-03-08 DIAGNOSIS — Z87891 Personal history of nicotine dependence: Secondary | ICD-10-CM | POA: Diagnosis not present

## 2022-03-08 DIAGNOSIS — Z7902 Long term (current) use of antithrombotics/antiplatelets: Secondary | ICD-10-CM

## 2022-03-08 DIAGNOSIS — K921 Melena: Principal | ICD-10-CM | POA: Diagnosis present

## 2022-03-08 DIAGNOSIS — K219 Gastro-esophageal reflux disease without esophagitis: Secondary | ICD-10-CM | POA: Diagnosis not present

## 2022-03-08 DIAGNOSIS — K8020 Calculus of gallbladder without cholecystitis without obstruction: Secondary | ICD-10-CM | POA: Diagnosis not present

## 2022-03-08 DIAGNOSIS — N179 Acute kidney failure, unspecified: Secondary | ICD-10-CM | POA: Diagnosis not present

## 2022-03-08 DIAGNOSIS — K922 Gastrointestinal hemorrhage, unspecified: Secondary | ICD-10-CM | POA: Diagnosis present

## 2022-03-08 DIAGNOSIS — Z888 Allergy status to other drugs, medicaments and biological substances status: Secondary | ICD-10-CM

## 2022-03-08 DIAGNOSIS — R079 Chest pain, unspecified: Secondary | ICD-10-CM | POA: Diagnosis not present

## 2022-03-08 DIAGNOSIS — I82492 Acute embolism and thrombosis of other specified deep vein of left lower extremity: Secondary | ICD-10-CM | POA: Diagnosis not present

## 2022-03-08 DIAGNOSIS — I4821 Permanent atrial fibrillation: Secondary | ICD-10-CM | POA: Diagnosis present

## 2022-03-08 DIAGNOSIS — F3341 Major depressive disorder, recurrent, in partial remission: Secondary | ICD-10-CM | POA: Diagnosis not present

## 2022-03-08 DIAGNOSIS — Z0389 Encounter for observation for other suspected diseases and conditions ruled out: Secondary | ICD-10-CM | POA: Diagnosis not present

## 2022-03-08 DIAGNOSIS — J449 Chronic obstructive pulmonary disease, unspecified: Secondary | ICD-10-CM | POA: Diagnosis not present

## 2022-03-08 DIAGNOSIS — Z7984 Long term (current) use of oral hypoglycemic drugs: Secondary | ICD-10-CM

## 2022-03-08 DIAGNOSIS — I10 Essential (primary) hypertension: Secondary | ICD-10-CM | POA: Diagnosis present

## 2022-03-08 DIAGNOSIS — I2510 Atherosclerotic heart disease of native coronary artery without angina pectoris: Secondary | ICD-10-CM | POA: Diagnosis present

## 2022-03-08 DIAGNOSIS — Z79899 Other long term (current) drug therapy: Secondary | ICD-10-CM

## 2022-03-08 DIAGNOSIS — E66811 Obesity, class 1: Secondary | ICD-10-CM | POA: Diagnosis present

## 2022-03-08 DIAGNOSIS — I700 Atherosclerosis of aorta: Secondary | ICD-10-CM | POA: Diagnosis not present

## 2022-03-08 DIAGNOSIS — R42 Dizziness and giddiness: Secondary | ICD-10-CM | POA: Diagnosis not present

## 2022-03-08 DIAGNOSIS — I2699 Other pulmonary embolism without acute cor pulmonale: Secondary | ICD-10-CM | POA: Diagnosis not present

## 2022-03-08 DIAGNOSIS — R0602 Shortness of breath: Secondary | ICD-10-CM | POA: Diagnosis not present

## 2022-03-08 DIAGNOSIS — Z8546 Personal history of malignant neoplasm of prostate: Secondary | ICD-10-CM | POA: Diagnosis not present

## 2022-03-08 DIAGNOSIS — E785 Hyperlipidemia, unspecified: Secondary | ICD-10-CM | POA: Diagnosis present

## 2022-03-08 DIAGNOSIS — F419 Anxiety disorder, unspecified: Secondary | ICD-10-CM | POA: Diagnosis present

## 2022-03-08 DIAGNOSIS — F32A Depression, unspecified: Secondary | ICD-10-CM | POA: Diagnosis not present

## 2022-03-08 DIAGNOSIS — I82442 Acute embolism and thrombosis of left tibial vein: Secondary | ICD-10-CM | POA: Diagnosis not present

## 2022-03-08 DIAGNOSIS — E669 Obesity, unspecified: Secondary | ICD-10-CM | POA: Diagnosis present

## 2022-03-08 HISTORY — DX: Anemia, unspecified: D64.9

## 2022-03-08 HISTORY — DX: Other pulmonary embolism without acute cor pulmonale: I26.99

## 2022-03-08 HISTORY — DX: Gastrointestinal hemorrhage, unspecified: K92.2

## 2022-03-08 LAB — URINALYSIS, ROUTINE W REFLEX MICROSCOPIC
Bacteria, UA: NONE SEEN
Bilirubin Urine: NEGATIVE
Glucose, UA: >=500 mg/dL — AB
Hgb urine dipstick: NEGATIVE
Ketones, ur: NEGATIVE mg/dL
Leukocytes,Ua: NEGATIVE
Nitrite: NEGATIVE
Protein, ur: NEGATIVE mg/dL
Specific Gravity, Urine: 1.024 (ref 1.005–1.030)
pH: 6 (ref 5.0–8.0)

## 2022-03-08 LAB — COMPREHENSIVE METABOLIC PANEL
ALT: 18 U/L (ref 0–44)
AST: 17 U/L (ref 15–41)
Albumin: 3.5 g/dL (ref 3.5–5.0)
Alkaline Phosphatase: 46 U/L (ref 38–126)
Anion gap: 8 (ref 5–15)
BUN: 26 mg/dL — ABNORMAL HIGH (ref 8–23)
CO2: 22 mmol/L (ref 22–32)
Calcium: 9.2 mg/dL (ref 8.9–10.3)
Chloride: 107 mmol/L (ref 98–111)
Creatinine, Ser: 1.26 mg/dL — ABNORMAL HIGH (ref 0.61–1.24)
GFR, Estimated: >60 mL/min (ref >60)
Glucose, Bld: 170 mg/dL — ABNORMAL HIGH (ref 70–99)
Potassium: 4.4 mmol/L (ref 3.5–5.1)
Sodium: 137 mmol/L (ref 135–145)
Total Bilirubin: 0.7 mg/dL (ref 0.3–1.2)
Total Protein: 5.9 g/dL — ABNORMAL LOW (ref 6.5–8.1)

## 2022-03-08 LAB — CBC WITH DIFFERENTIAL/PLATELET
Abs Immature Granulocytes: 0.01 10*3/uL (ref 0.00–0.07)
Basophils Absolute: 0 10*3/uL (ref 0.0–0.1)
Basophils Relative: 1 %
Eosinophils Absolute: 0.1 10*3/uL (ref 0.0–0.5)
Eosinophils Relative: 2 %
HCT: 26.6 % — ABNORMAL LOW (ref 39.0–52.0)
Hemoglobin: 8.4 g/dL — ABNORMAL LOW (ref 13.0–17.0)
Immature Granulocytes: 0 %
Lymphocytes Relative: 23 %
Lymphs Abs: 1 10*3/uL (ref 0.7–4.0)
MCH: 30.4 pg (ref 26.0–34.0)
MCHC: 31.6 g/dL (ref 30.0–36.0)
MCV: 96.4 fL (ref 80.0–100.0)
Monocytes Absolute: 0.4 10*3/uL (ref 0.1–1.0)
Monocytes Relative: 10 %
Neutro Abs: 2.8 10*3/uL (ref 1.7–7.7)
Neutrophils Relative %: 64 %
Platelets: 177 10*3/uL (ref 150–400)
RBC: 2.76 MIL/uL — ABNORMAL LOW (ref 4.22–5.81)
RDW: 15.8 % — ABNORMAL HIGH (ref 11.5–15.5)
WBC: 4.2 10*3/uL (ref 4.0–10.5)
nRBC: 0 % (ref 0.0–0.2)

## 2022-03-08 LAB — BLOOD GAS, VENOUS
Acid-base deficit: 2.2 mmol/L — ABNORMAL HIGH (ref 0.0–2.0)
Bicarbonate: 22.4 mmol/L (ref 20.0–28.0)
O2 Saturation: 100 %
Patient temperature: 37
pCO2, Ven: 37 mmHg — ABNORMAL LOW (ref 44–60)
pH, Ven: 7.39 (ref 7.25–7.43)
pO2, Ven: 104 mmHg — ABNORMAL HIGH (ref 32–45)

## 2022-03-08 LAB — I-STAT CHEM 8, ED
BUN: 23 mg/dL (ref 8–23)
Calcium, Ion: 1.22 mmol/L (ref 1.15–1.40)
Chloride: 105 mmol/L (ref 98–111)
Creatinine, Ser: 1.4 mg/dL — ABNORMAL HIGH (ref 0.61–1.24)
Glucose, Bld: 162 mg/dL — ABNORMAL HIGH (ref 70–99)
HCT: 26 % — ABNORMAL LOW (ref 39.0–52.0)
Hemoglobin: 8.8 g/dL — ABNORMAL LOW (ref 13.0–17.0)
Potassium: 4.4 mmol/L (ref 3.5–5.1)
Sodium: 137 mmol/L (ref 135–145)
TCO2: 20 mmol/L — ABNORMAL LOW (ref 22–32)

## 2022-03-08 LAB — TYPE AND SCREEN
ABO/RH(D): O POS
Antibody Screen: NEGATIVE

## 2022-03-08 LAB — GLUCOSE, CAPILLARY: Glucose-Capillary: 178 mg/dL — ABNORMAL HIGH (ref 70–99)

## 2022-03-08 LAB — TROPONIN I (HIGH SENSITIVITY)
Troponin I (High Sensitivity): 44 ng/L — ABNORMAL HIGH (ref <18)
Troponin I (High Sensitivity): 35 ng/L — ABNORMAL HIGH (ref <18)

## 2022-03-08 LAB — MAGNESIUM: Magnesium: 2.1 mg/dL (ref 1.7–2.4)

## 2022-03-08 LAB — POC OCCULT BLOOD, ED: Fecal Occult Bld: POSITIVE — AB

## 2022-03-08 LAB — HEMOGLOBIN AND HEMATOCRIT, BLOOD
HCT: 24.2 % — ABNORMAL LOW (ref 39.0–52.0)
Hemoglobin: 7.7 g/dL — ABNORMAL LOW (ref 13.0–17.0)

## 2022-03-08 LAB — BRAIN NATRIURETIC PEPTIDE: B Natriuretic Peptide: 85.3 pg/mL (ref 0.0–100.0)

## 2022-03-08 LAB — PROTIME-INR
INR: 1.1 (ref 0.8–1.2)
Prothrombin Time: 13.6 seconds (ref 11.4–15.2)

## 2022-03-08 LAB — LACTIC ACID, PLASMA: Lactic Acid, Venous: 1.7 mmol/L (ref 0.5–1.9)

## 2022-03-08 MED ORDER — ACETAMINOPHEN 650 MG RE SUPP: 650.0000 mg | Freq: Four times a day (QID) | RECTAL | Status: DC | PRN

## 2022-03-08 MED ORDER — PANTOPRAZOLE SODIUM 40 MG IV SOLR
40.0000 mg | Freq: Once | INTRAVENOUS | Status: AC
  Administered 2022-03-08: 40 mg via INTRAVENOUS
  Filled 2022-03-08: qty 10

## 2022-03-08 MED ORDER — PANTOPRAZOLE 80MG IVPB - SIMPLE MED: 80.0000 mg | Freq: Two times a day (BID) | INTRAVENOUS | Status: DC

## 2022-03-08 MED ORDER — PANTOPRAZOLE SODIUM 40 MG IV SOLR: 40.0000 mg | Freq: Two times a day (BID) | INTRAVENOUS | Status: DC

## 2022-03-08 MED ORDER — ACETAMINOPHEN 325 MG PO TABS: 650.0000 mg | ORAL_TABLET | Freq: Four times a day (QID) | ORAL | Status: DC | PRN

## 2022-03-08 MED ORDER — INSULIN ASPART 100 UNIT/ML IJ SOLN
0.0000 [IU] | Freq: Three times a day (TID) | INTRAMUSCULAR | Status: DC
  Administered 2022-03-09: 2 [IU] via SUBCUTANEOUS
  Administered 2022-03-09: 3 [IU] via SUBCUTANEOUS
  Administered 2022-03-09 – 2022-03-10 (×2): 2 [IU] via SUBCUTANEOUS
  Administered 2022-03-10 – 2022-03-11 (×4): 3 [IU] via SUBCUTANEOUS
  Administered 2022-03-12: 2 [IU] via SUBCUTANEOUS
  Filled 2022-03-08: qty 0.15

## 2022-03-08 MED ORDER — PANTOPRAZOLE INFUSION (NEW) - SIMPLE MED
8.0000 mg/h | INTRAVENOUS | Status: DC
  Administered 2022-03-08 – 2022-03-09 (×2): 8 mg/h via INTRAVENOUS
  Filled 2022-03-08: qty 80
  Filled 2022-03-08: qty 100
  Filled 2022-03-08: qty 80

## 2022-03-08 MED ORDER — MOMETASONE FURO-FORMOTEROL FUM 200-5 MCG/ACT IN AERO
2.0000 | INHALATION_SPRAY | Freq: Two times a day (BID) | RESPIRATORY_TRACT | Status: DC
  Administered 2022-03-08 – 2022-03-12 (×8): 2 via RESPIRATORY_TRACT
  Filled 2022-03-08: qty 8.8

## 2022-03-08 MED ORDER — IOHEXOL 350 MG/ML SOLN
100.0000 mL | Freq: Once | INTRAVENOUS | Status: AC | PRN
  Administered 2022-03-08: 80 mL via INTRAVENOUS

## 2022-03-08 MED ORDER — PANTOPRAZOLE SODIUM 40 MG IV SOLR
40.0000 mg | Freq: Two times a day (BID) | INTRAVENOUS | Status: DC
  Administered 2022-03-08: 40 mg via INTRAVENOUS
  Filled 2022-03-08: qty 10

## 2022-03-08 MED ORDER — TAMSULOSIN HCL 0.4 MG PO CAPS
0.4000 mg | ORAL_CAPSULE | Freq: Two times a day (BID) | ORAL | Status: DC
  Administered 2022-03-08 – 2022-03-12 (×8): 0.4 mg via ORAL
  Filled 2022-03-08 (×8): qty 1

## 2022-03-08 MED ORDER — SODIUM CHLORIDE 0.9 % IV SOLN: INTRAVENOUS | Status: AC

## 2022-03-08 MED ORDER — LACTATED RINGERS IV BOLUS (SEPSIS)
1000.0000 mL | Freq: Once | INTRAVENOUS | Status: AC
  Administered 2022-03-08: 1000 mL via INTRAVENOUS

## 2022-03-08 MED ORDER — INSULIN ASPART 100 UNIT/ML IJ SOLN
0.0000 [IU] | Freq: Every day | INTRAMUSCULAR | Status: DC
  Administered 2022-03-10 – 2022-03-11 (×2): 2 [IU] via SUBCUTANEOUS
  Filled 2022-03-08: qty 0.05

## 2022-03-08 MED ORDER — ROSUVASTATIN CALCIUM 20 MG PO TABS
20.0000 mg | ORAL_TABLET | Freq: Every day | ORAL | Status: DC
  Administered 2022-03-08 – 2022-03-11 (×4): 20 mg via ORAL
  Filled 2022-03-08 (×4): qty 1

## 2022-03-08 MED ORDER — SODIUM CHLORIDE (PF) 0.9 % IJ SOLN
INTRAMUSCULAR | Status: AC
  Filled 2022-03-08: qty 50

## 2022-03-08 NOTE — Telephone Encounter (Signed)
[facility] - Client TELEPHONE ADVICE RECORD [facility] Patient Name: [name] Cook Gender: Male DOB: 06-Jul-1950 Age: 71 Y 9 M 22 D Return Phone Number: [phone] (Primary) Address: City/ State/ Zip: [place] [address] Client [place] [facility] - Client Client Site [place] - Day Provider Alma Friendly - NP Contact Type Call Who Is Calling Patient / Member / Family / Caregiver Call Type Triage / Clinical Relationship To Patient Self Return Phone Number [phone] (Primary) Chief Complaint Blood In Stool Reason for Call Symptomatic / Request for Health Information Initial Comment Pt was recently in ER last week for bloody stool and dizziness. Symptoms have not went away. Translation No Nurse Assessment Nurse: Ronnald Ramp, RN, Miranda Date/Time (Eastern Time): 03/08/2022 9:45:54 AM Confirm and document reason for call. If symptomatic, describe symptoms. ---Caller states he was in the hospital for 1 week for GI bleed. He received 4 pints of blood. He was discharged on Friday and told that the area of bleeding had closed. He felt better when he left the hospital but still had some dizziness. This morning, he is too weak do more than get to the kitchen and back. He is also having increased SOB with activity. He is concerned the bleeding has resumed. No fresh bleeding. Does the patient have any new or worsening symptoms? ---Yes Will a triage be completed? ---Yes Related visit to physician within the last 2 weeks? ---Yes Does the PT have any chronic conditions? (i.e. diabetes, asthma, this includes High risk factors for pregnancy, etc.) ---Yes List chronic conditions. ---Recent GI bleed, Diabetes, HTN, Blood thinners (holding), A-fib Is this a behavioral health or substance abuse call? ---No Guidelines Guideline Title Affirmed Question Affirmed Notes Nurse Date/Time  (Eastern Time) Weakness (Generalized) and Fatigue Difficulty breathing Ronnald Ramp, RN, Miranda 03/08/2022 9:51:56 AM PLEASE NOTE: All timestamps contained within this report are represented as [place] Standard Time. CONFIDENTIALTY NOTICE: This fax transmission is intended only for the addressee. It contains information that is legally privileged, confidential or otherwise protected from use or disclosure. If you are not the intended recipient, you are strictly prohibited from reviewing, disclosing, copying using or disseminating any of this information or taking any action in reliance on or regarding this information. If you have received this fax in error, please notify us immediately by telephone so that we can arrange for its return to [place]. Phone: [phone], Toll-Free: [phone], Fax: [phone] Page: 2 of 2 Call Id: [id number] [place]. Time Eilene Ghazi Time) Disposition Final User 03/08/2022 9:54:10 AM Go to ED Now Yes Ronnald Ramp, RN, Miranda Final Disposition 03/08/2022 9:54:10 AM Go to ED Now Yes Ronnald Ramp, RN, Miranda Caller Disagree/Comply Comply Caller Understands Yes PreDisposition Call Doctor Care Advice Given Per Guideline GO TO ED NOW: * You need to be seen in the Emergency Department. * Go to the ED at ___________ [place] now. Drive carefully. NOTE TO TRIAGER - DRIVING: * Another adult should drive. CARE ADVICE given per Weakness and Fatigue (Adult) guideline. BRING MEDICINES: * Bring a list of your current medicines when you go to the Emergency Department (ER). * Bring the pill bottles too. This will help the doctor (or NP/PA) to make certain you are taking the right medicines and the right dose. Referrals [facility] - E

## 2022-03-08 NOTE — H&P (Signed)
History and Physical    Patient: [name] Cook HWE:[id number] DOB: 01/11/1951 DOA: 03/08/2022 DOS: the patient was seen and examined on 03/08/2022 PCP: [name], NP  Patient coming from: Home  Chief Complaint:  Chief Complaint  Patient presents with   Rectal Bleeding   HPI: [name] Cook is a 71 y.o. male with medical history significant of persistent a fib s/p PPM, CAD, HTN, DM2. Presenting with dark stools and weakness. He was just discharged from the hospital on 11/17 after a stay for GIB. At the time, he required 4 units of pRBCs. At the time of discharge, his Hgb was 8.1. At discharge, it was decided that he would hold his coumadin, but continue his plavix. He reports that he's been constipated up until yesterday. He has had continued fatigue. When he had a BM yesterday, he noted it was dark. When he woke this morning, he had another dark BM. He felt even weaker and felt as if he were going to pass out. He spoke with his PCP and it was recommended that he come to the ED for evaluation. He denies any other aggravating or alleviating factors.    Review of Systems: As mentioned in the history of present illness. All other systems reviewed and are negative. Past Medical History:  Diagnosis Date   Acute bronchitis with COPD ([place]) 03/07/2021   Arthritis    "knees and lower back" (03/14/2013)   Atrial flutter ([place])    radiofrequency ablation in 2001   CAD (coronary artery disease)    a. Nonobstructive. Cardiac cath in 2001-50% mid RI, normal LM, LAD, RCA b. cath 10/16/2014 95% mid RCA treated with DES, 99% ost D1 medical management due to small aneurysmal segment   Chronic anticoagulation    chronic Coumadin anticoagulation   Chronic obstructive pulmonary disease ([place]) 04/20/2011   Diabetes mellitus, type 2 (HCC)    Elevated lipase 06/23/2021   GERD (gastroesophageal reflux disease)    Hyperlipidemia    Hypertension    with hypertensive heart disease   Left knee pain 10/25/2017    medial   Obesity    Persistent atrial fibrillation ([place])    recurrent atrial flutter since 2001 s/p DCCVs, multiple failed AADs, h/o tachy-mediated cardiomyopathy   Shortness of breath    "can come on at any time" (03/14/2013)   Sleep apnea    "dx'd; couldn't wear the mask" (03/14/2013)   Tobacco abuse    Past Surgical History:  Procedure Laterality Date   ATRIAL FLUTTER ABLATION  2002   atrial flutter; subsequently developed atrial fibrillation   AV NODE ABLATION  01/24/2013   CARDIAC CATHETERIZATION  2002   CARDIAC CATHETERIZATION N/A 10/16/2014   Procedure: Left Heart Cath and Coronary Angiography;  Surgeon: [name], MD;  Location: [facility];  Service: Cardiovascular;  Laterality: N/A;   CARDIOVERSION  05/31/2011   Procedure: CARDIOVERSION;  Surgeon: Cristopher Estimable. Lattie Haw, MD;  Location: [facility];  Service: Cardiovascular;  Laterality: N/A;   CARPAL TUNNEL RELEASE Left 1980's   COLONOSCOPY WITH PROPOFOL N/A 12/30/2018   Procedure: COLONOSCOPY WITH PROPOFOL;  Surgeon: Jonathon Bellows, MD;  Location: [facility] ENDOSCOPY;  Service: Gastroenterology;  Laterality: N/A;   COLONOSCOPY WITH PROPOFOL N/A 12/04/2019   Procedure: COLONOSCOPY WITH PROPOFOL;  Surgeon: Jonathon Bellows, MD;  [facility];  Service: Gastroenterology;  Laterality: N/A;   COLONOSCOPY WITH PROPOFOL N/A 06/13/2021   Procedure: COLONOSCOPY WITH PROPOFOL;  Surgeon: Jonathon Bellows, MD;  Location: [facility] ENDOSCOPY;  Service: Gastroenterology;  Laterality: N/A;   ESOPHAGOGASTRODUODENOSCOPY N/A 03/01/2022   Procedure: ESOPHAGOGASTRODUODENOSCOPY (EGD);  Surgeon: Clarene Essex, MD;  Location: [facility];  Service: Gastroenterology;  Laterality: N/A;   INSERT / REPLACE / REMOVE PACEMAKER  01/24/2013    Medtronic Adapta L dual-chamber pacemaker, serial number [id number]    LEFT HEART CATH AND CORONARY ANGIOGRAPHY N/A 06/07/2018   Procedure: LEFT HEART CATH AND CORONARY ANGIOGRAPHY;  Surgeon: [name], MD;  Location: [facility];  Service: Cardiovascular;  Laterality: N/A;   LEFT HEART CATHETERIZATION WITH CORONARY ANGIOGRAM N/A 03/17/2013   Procedure: LEFT HEART CATHETERIZATION WITH CORONARY ANGIOGRAM;  Surgeon: Burnell Blanks, MD; LAD mild dz, D1 branch 100%, inferior branch 99%, CFX OK, RCA 50%, EF 65%     LOOP RECORDER IMPLANT  2002   PERMANENT PACEMAKER INSERTION N/A 01/24/2013   Procedure: PERMANENT PACEMAKER INSERTION;  Surgeon: [name], MD;  [facility];  Service: Cardiovascular;  Laterality: N/A;   TIBIAL TUBERCLERPLASTY  ~ 2003   Social History:  reports that he quit smoking about 8 years ago. His smoking use included cigarettes. He has a 42.00 pack-year smoking history. He has never used smokeless tobacco. He reports that he does not currently use alcohol. He reports that he does not use drugs.  Allergies  Allergen Reactions   Januvia [Sitagliptin] Other (See Comments)    Elevated lipase   Metformin And Related Diarrhea    Family History  Problem Relation Age of Onset   Alzheimer's disease Mother    Osteoporosis Mother     Prior to Admission medications   Medication Sig Start Date End Date Taking? Authorizing Provider  albuterol (VENTOLIN HFA) 108 (90 Base) MCG/ACT inhaler INHALE 2 PUFFS BY MOUTH EVERY 4 HOURS AS NEEDED FOR WHEEZE OR FOR SHORTNESS OF BREATH Patient taking differently: Inhale 2 puffs into the lungs every 4 (four) hours as needed for wheezing or shortness of breath. 01/06/22   [name], NP  Ascorbic Acid (VITAMIN C) 1000 MG tablet Take 2,000 mg by mouth daily.    [provider]  blood glucose meter kit and supplies KIT Dispense based on patient and insurance preference. Use up to four times daily as directed. (FOR ICD-9 250.00, 250.01). 04/08/21   [name], NP  budesonide-formoterol (SYMBICORT) 160-4.5 MCG/ACT inhaler INHALE 2 PUFFS INTO THE LUNGS TWICE A DAY Patient taking differently: Inhale 2 puffs into the lungs 2  (two) times daily as needed ("for flares"). 01/12/21   [name], NP  clopidogrel (PLAVIX) 75 MG tablet TAKE 1 TABLET BY MOUTH EVERY DAY Patient taking differently: Take 75 mg by mouth daily. 08/29/21   [name], MD  fish oil-omega-3 fatty acids 1000 MG capsule Take 2 g by mouth daily.    [provider]  fluticasone (FLONASE) 50 MCG/ACT nasal spray PLACE 1 SPRAY INTO BOTH NOSTRILS DAILY AS NEEDED FOR ALLERGIES OR RHINITIS. Patient taking differently: Place 1 spray into both nostrils daily as needed for allergies or rhinitis. 06/23/21   [name], NP  furosemide (LASIX) 20 MG tablet TAKE 1 TABLET BY MOUTH EVERY DAY Patient taking differently: Take 20 mg by mouth in the morning. 08/29/21   [name], MD  gabapentin (NEURONTIN) 300 MG capsule TAKE 1 CAPSULE BY MOUTH EVERY MORNING AND 2 CAPSULES BY MOUTH AT NIGHT FOR NEUROPATHY Patient taking differently: Take 300 mg by mouth in the morning and at bedtime. 11/18/21   [name], NP  glipiZIDE (GLUCOTROL XL) 10 MG 24 hr tablet TAKE 1 TABLET (10 MG TOTAL) BY MOUTH DAILY WITH BREAKFAST. FOR DIABETES. 12/05/21   [name], NP  glucose blood test strip 1 each by Other route as needed for other. Use to check blood sugar up to three times a day-Accu-Chek meter    [provider]  isosorbide mononitrate (IMDUR) 60 MG 24 hr tablet TAKE 1 TABLET BY MOUTH EVERY DAY Patient taking differently: Take 60 mg by mouth daily. 01/06/22   [name], MD  JARDIANCE 25 MG TABS tablet TAKE 1 TABLET (25 MG TOTAL) BY MOUTH DAILY BEFORE BREAKFAST. FOR DIABETES. Patient taking differently: 25 mg daily before breakfast. 12/22/21   [name], NP  lisinopril (ZESTRIL) 2.5 MG tablet TAKE 1 TABLET BY MOUTH EVERY DAY Patient taking differently: Take 2.5 mg by mouth in the morning. 07/11/21   [name], MD  nitroGLYCERIN (NITROSTAT) 0.4 MG SL tablet Place 1 tablet (0.4 mg total) under the tongue every 5 (five)  minutes as needed for chest pain. 04/08/21   [name], NP  pantoprazole (PROTONIX) 40 MG tablet Take 1 tablet (40 mg total) by mouth 2 (two) times daily. 03/03/22 06/01/22  Barb Merino, MD  rosuvastatin (CRESTOR) 20 MG tablet TAKE 1 TABLET BY MOUTH EVERY DAY IN THE EVENING FOR CHOLESTEROL Patient taking differently: Take 20 mg by mouth at bedtime. 11/24/21   [name], NP  sertraline (ZOLOFT) 50 MG tablet TAKE 1 TABLET (50 MG TOTAL) BY MOUTH DAILY. FOR ANXIETY AND DEPRESSION. 01/06/22   [name], NP  tamsulosin (FLOMAX) 0.4 MG CAPS capsule TAKE 1 CAPSULE (0.4 MG TOTAL) BY MOUTH 2 (TWO) TIMES DAILY. Patient taking differently: Take 0.4 mg by mouth at bedtime. 01/12/21   Noreene Filbert, MD  TYLENOL 500 MG tablet Take 500-1,000 mg by mouth every 6 (six) hours as needed for mild pain or headache.    [provider]    Physical Exam: Vitals:   03/08/22 1530 03/08/22 1600 03/08/22 1630 03/08/22 1700  BP: (!) 114/57 (!) 115/59 107/61 (!) 99/59  Pulse: 71 81 70 69  Resp: 20   18  Temp:    98.1 F (36.7 C)  TempSrc:    Oral  SpO2: 98% 98% 97% 100%   General: 71 y.o. male resting in bed in NAD Eyes: PERRL, normal sclera ENMT: Nares patent w/o discharge, orophaynx clear, dentition normal, ears w/o discharge/lesions/ulcers Neck: Supple, trachea midline Cardiovascular: RRR, +S1, S2, no g/r, 1/6 SEM, equal pulses throughout Respiratory: CTABL, no w/r/r, normal WOB GI: BS+, NDNT, no masses noted, no organomegaly noted MSK: No e/c/c Neuro: A&O x 3, no focal deficits Psyc: Appropriate interaction and affect, calm/cooperative  Data Reviewed:  Results for orders placed or performed during the hospital encounter of 03/08/22 (from the past 24 hour(s))  POC occult blood, ED     Status: Abnormal   Collection Time: 03/08/22 12:23 PM  Result Value Ref Range   Fecal Occult Bld POSITIVE (A) NEGATIVE  Blood gas, venous ([facility], [facility], [facility])     Status: Abnormal   Collection  Time: 03/08/22 12:30 PM  Result Value Ref Range   pH, Ven 7.39 7.25 - 7.43   pCO2, Ven 37 (L) 44 - 60 mmHg   pO2, Ven 104 (H) 32 - 45 mmHg   Bicarbonate 22.4 20.0 - 28.0 mmol/L   Acid-base deficit 2.2 (H) 0.0 - 2.0 mmol/L   O2 Saturation 100 %  Patient temperature 37.0   Lactic acid, plasma     Status: None   Collection Time: 03/08/22 12:33 PM  Result Value Ref Range   Lactic Acid, Venous 1.7 0.5 - 1.9 mmol/L  Comprehensive metabolic panel     Status: Abnormal   Collection Time: 03/08/22 12:33 PM  Result Value Ref Range   Sodium 137 135 - 145 mmol/L   Potassium 4.4 3.5 - 5.1 mmol/L   Chloride 107 98 - 111 mmol/L   CO2 22 22 - 32 mmol/L   Glucose, Bld 170 (H) 70 - 99 mg/dL   BUN 26 (H) 8 - 23 mg/dL   Creatinine, Ser 1.26 (H) 0.61 - 1.24 mg/dL   Calcium 9.2 8.9 - 10.3 mg/dL   Total Protein 5.9 (L) 6.5 - 8.1 g/dL   Albumin 3.5 3.5 - 5.0 g/dL   AST 17 15 - 41 U/L   ALT 18 0 - 44 U/L   Alkaline Phosphatase 46 38 - 126 U/L   Total Bilirubin 0.7 0.3 - 1.2 mg/dL   GFR, Estimated >60 >60 mL/min   Anion gap 8 5 - 15  CBC with Differential     Status: Abnormal   Collection Time: 03/08/22 12:33 PM  Result Value Ref Range   WBC 4.2 4.0 - 10.5 K/uL   RBC 2.76 (L) 4.22 - 5.81 MIL/uL   Hemoglobin 8.4 (L) 13.0 - 17.0 g/dL   HCT 26.6 (L) 39.0 - 52.0 %   MCV 96.4 80.0 - 100.0 fL   MCH 30.4 26.0 - 34.0 pg   MCHC 31.6 30.0 - 36.0 g/dL   RDW 15.8 (H) 11.5 - 15.5 %   Platelets 177 150 - 400 K/uL   nRBC 0.0 0.0 - 0.2 %   Neutrophils Relative % 64 %   Neutro Abs 2.8 1.7 - 7.7 K/uL   Lymphocytes Relative 23 %   Lymphs Abs 1.0 0.7 - 4.0 K/uL   Monocytes Relative 10 %   Monocytes Absolute 0.4 0.1 - 1.0 K/uL   Eosinophils Relative 2 %   Eosinophils Absolute 0.1 0.0 - 0.5 K/uL   Basophils Relative 1 %   Basophils Absolute 0.0 0.0 - 0.1 K/uL   Immature Granulocytes 0 %   Abs Immature Granulocytes 0.01 0.00 - 0.07 K/uL  Protime-INR     Status: None   Collection Time: 03/08/22 12:33 PM   Result Value Ref Range   Prothrombin Time 13.6 11.4 - 15.2 seconds   INR 1.1 0.8 - 1.2  Troponin I (High Sensitivity)     Status: Abnormal   Collection Time: 03/08/22 12:33 PM  Result Value Ref Range   Troponin I (High Sensitivity) 44 (H) <18 ng/L  Type and screen [place]     Status: None   Collection Time: 03/08/22 12:33 PM  Result Value Ref Range   ABO/RH(D) O POS    Antibody Screen NEG    Sample Expiration      03/11/2022,2359 Performed at [facility], [place] [address] [place], [place] [address]   Magnesium     Status: None   Collection Time: 03/08/22 12:33 PM  Result Value Ref Range   Magnesium 2.1 1.7 - 2.4 mg/dL  I-stat chem 8, ED     Status: Abnormal   Collection Time: 03/08/22 12:40 PM  Result Value Ref Range   Sodium 137 135 - 145 mmol/L   Potassium 4.4 3.5 - 5.1 mmol/L   Chloride 105 98 - 111 mmol/L  BUN 23 8 - 23 mg/dL   Creatinine, Ser 1.40 (H) 0.61 - 1.24 mg/dL   Glucose, Bld 162 (H) 70 - 99 mg/dL   Calcium, Ion 1.22 1.15 - 1.40 mmol/L   TCO2 20 (L) 22 - 32 mmol/L   Hemoglobin 8.8 (L) 13.0 - 17.0 g/dL   HCT 26.0 (L) 39.0 - 52.0 %  Blood Culture (routine x 2)     Status: None (Preliminary result)   Collection Time: 03/08/22 12:46 PM   Specimen: BLOOD RIGHT ARM  Result Value Ref Range   Specimen Description      BLOOD RIGHT ARM Performed at [facility], [place] [address] [place], [place] [address]    Special Requests      BOTTLES DRAWN AEROBIC AND ANAEROBIC Blood Culture results may not be optimal due to an excessive volume of blood received in culture bottles Performed at [facility] [address], [place], [place] [address]    Culture PENDING    Report Status PENDING   Troponin I (High Sensitivity)     Status: Abnormal   Collection Time: 03/08/22  2:26 PM  Result Value Ref Range   Troponin I (High Sensitivity) 35 (H) <18 ng/L   *Note: Due to a large number of results and/or  encounters for the requested time period, some results have not been displayed. A complete set of results can be found in Results Review.   CTA Chest Filling defect in RIGHT lower lobe pulmonary artery consistent with pulmonary embolism. Cholelithiasis. Scattered atherosclerotic calcifications including coronary arteries. Aortic Atherosclerosis (ICD10-I70.0).   Assessment and Plan: Symptomatic anemia GIB     - place obs, progressive     - [facility] consulted, appreciate assistance     - Hgb is 8.8; will trend Hgb, transfuse for Hgb < 8     - hold antiplatelets, anticoagulation     - fluids, PPI, monitor  Acute pulmonary embolism     - can't anticoagulate d/t ongoing GIB     - holding his antiplatelets now too     - check venous dopplers of both legs; if positive, can consult IR for IVC evaluation     - echo  Permanent a fib s/p PPM CAD     - holding anticoag/antiplatelet     - continue statin  DM2     - SSI, glucose checks, CLD for now  HTN     - pressures were soft at presentation     - hold home BP regimen tonight, reassess for resuming in AM  HLD     - continue home statin  Anxiety Depression     - continue home regimen  Hx of prostate CA     - continue home regimen  COPD     - continue home regimen  AKI    - fluids    - renal US    - watch nephrotoxins  Advance Care Planning:   Code Status: FULL  Consults: [facility]  Family Communication: None at bedside  Severity of Illness: The appropriate patient status for this patient is OBSERVATION. Observation status is judged to be reasonable and necessary in order to provide the required intensity of service to ensure the patient's safety. The patient's presenting symptoms, physical exam findings, and initial radiographic and laboratory data in the context of their medical condition is felt to place them at decreased risk for further clinical deterioration. Furthermore, it is anticipated that the patient will  be  medically stable for discharge from the hospital within 2 midnights of admission.   Author: Jonnie Finner, DO 03/08/2022 5:52 PM  For on call review [email or web address].

## 2022-03-08 NOTE — ED Triage Notes (Signed)
C/o black tarry stools with weakness, dizziness, sob, and upper abd pain seen in ER for same with admission and EGD and blood transfusion.  Coumadin was stopped during admission but continued Plavix.

## 2022-03-08 NOTE — ED Provider Notes (Signed)
[place] DEPT Provider Note   CSN: [id number] Arrival date & time: 03/08/22  1135     History  Chief Complaint  Patient presents with   Rectal Bleeding    [name] Cook is a 71 y.o. male.   Rectal Bleeding Associated symptoms: dizziness, fever and light-headedness   Patient presents for fatigue, generalized weakness, shortness of breath (worsened with exertion), near-syncopal symptoms, and persistent dark stools.  9 days ago, he was admitted for symptomatic anemia secondary to GI bleed.  At the time, he was on warfarin and Plavix.  He underwent upper endoscopy which did not identify any source of bleeding.  He received a total of 4 units PRBCs while in the hospital.  He was started on Protonix.  He was advised to discontinue warfarin but to resume Plavix.  He was discharged 5 days ago.  Since his time of discharge, he has had ongoing fatigue, shortness of breath, and generalized weakness.  Last night, he had his first bowel movement since his hospital discharge.  Bowel movement was notable for persistent melena.  He had an additional melanotic bowel movement this morning.  For this reason, he presents to the ED.     Home Medications Prior to Admission medications   Medication Sig Start Date End Date Taking? Authorizing Provider  albuterol (VENTOLIN HFA) 108 (90 Base) MCG/ACT inhaler INHALE 2 PUFFS BY MOUTH EVERY 4 HOURS AS NEEDED FOR WHEEZE OR FOR SHORTNESS OF BREATH Patient taking differently: Inhale 2 puffs into the lungs every 4 (four) hours as needed for wheezing or shortness of breath. 01/06/22   [name], NP  Ascorbic Acid (VITAMIN C) 1000 MG tablet Take 2,000 mg by mouth daily.    [provider]  blood glucose meter kit and supplies KIT Dispense based on patient and insurance preference. Use up to four times daily as directed. (FOR ICD-9 250.00, 250.01). 04/08/21   [name], NP  budesonide-formoterol (SYMBICORT)  160-4.5 MCG/ACT inhaler INHALE 2 PUFFS INTO THE LUNGS TWICE A DAY Patient taking differently: Inhale 2 puffs into the lungs 2 (two) times daily as needed ("for flares"). 01/12/21   [name], NP  clopidogrel (PLAVIX) 75 MG tablet TAKE 1 TABLET BY MOUTH EVERY DAY Patient taking differently: Take 75 mg by mouth daily. 08/29/21   [name], MD  fish oil-omega-3 fatty acids 1000 MG capsule Take 2 g by mouth daily.    [provider]  fluticasone (FLONASE) 50 MCG/ACT nasal spray PLACE 1 SPRAY INTO BOTH NOSTRILS DAILY AS NEEDED FOR ALLERGIES OR RHINITIS. Patient taking differently: Place 1 spray into both nostrils daily as needed for allergies or rhinitis. 06/23/21   [name], NP  furosemide (LASIX) 20 MG tablet TAKE 1 TABLET BY MOUTH EVERY DAY Patient taking differently: Take 20 mg by mouth in the morning. 08/29/21   [name], MD  gabapentin (NEURONTIN) 300 MG capsule TAKE 1 CAPSULE BY MOUTH EVERY MORNING AND 2 CAPSULES BY MOUTH AT NIGHT FOR NEUROPATHY Patient taking differently: Take 300 mg by mouth in the morning and at bedtime. 11/18/21   [name], NP  glipiZIDE (GLUCOTROL XL) 10 MG 24 hr tablet TAKE 1 TABLET (10 MG TOTAL) BY MOUTH DAILY WITH BREAKFAST. FOR DIABETES. 12/05/21   [name], NP  glucose blood test strip 1 each by Other route as needed for other. Use to check blood sugar up to three times a [employer],  Historical, MD  isosorbide mononitrate (IMDUR) 60 MG 24 hr tablet TAKE 1 TABLET BY MOUTH EVERY DAY Patient taking differently: Take 60 mg by mouth daily. 01/06/22   [name], MD  JARDIANCE 25 MG TABS tablet TAKE 1 TABLET (25 MG TOTAL) BY MOUTH DAILY BEFORE BREAKFAST. FOR DIABETES. Patient taking differently: 25 mg daily before breakfast. 12/22/21   [name], NP  lisinopril (ZESTRIL) 2.5 MG tablet TAKE 1 TABLET BY MOUTH EVERY DAY Patient taking differently: Take 2.5 mg by mouth in the morning. 07/11/21    [name], MD  nitroGLYCERIN (NITROSTAT) 0.4 MG SL tablet Place 1 tablet (0.4 mg total) under the tongue every 5 (five) minutes as needed for chest pain. 04/08/21   [name], NP  pantoprazole (PROTONIX) 40 MG tablet Take 1 tablet (40 mg total) by mouth 2 (two) times daily. 03/03/22 06/01/22  Barb Merino, MD  rosuvastatin (CRESTOR) 20 MG tablet TAKE 1 TABLET BY MOUTH EVERY DAY IN THE EVENING FOR CHOLESTEROL Patient taking differently: Take 20 mg by mouth at bedtime. 11/24/21   [name], NP  sertraline (ZOLOFT) 50 MG tablet TAKE 1 TABLET (50 MG TOTAL) BY MOUTH DAILY. FOR ANXIETY AND DEPRESSION. 01/06/22   [name], NP  tamsulosin (FLOMAX) 0.4 MG CAPS capsule TAKE 1 CAPSULE (0.4 MG TOTAL) BY MOUTH 2 (TWO) TIMES DAILY. Patient taking differently: Take 0.4 mg by mouth at bedtime. 01/12/21   Noreene Filbert, MD  TYLENOL 500 MG tablet Take 500-1,000 mg by mouth every 6 (six) hours as needed for mild pain or headache.    [provider]      Allergies    Januvia [sitagliptin] and Metformin and related    Review of Systems   Review of Systems  Constitutional:  Positive for activity change and fever.  Respiratory:  Positive for shortness of breath.   Gastrointestinal:  Positive for blood in stool and hematochezia.  Neurological:  Positive for dizziness, weakness (Generalized) and light-headedness.  All other systems reviewed and are negative.   Physical Exam Updated Vital Signs BP (!) 99/59   Pulse 69   Temp 98.1 F (36.7 C) (Oral)   Resp 18   SpO2 100%  Physical Exam Vitals and nursing note reviewed.  Constitutional:      General: He is not in acute distress.    Appearance: Normal appearance. He is well-developed. He is not ill-appearing, toxic-appearing or diaphoretic.  HENT:     Head: Normocephalic and atraumatic.     Right Ear: External ear normal.     Left Ear: External ear normal.     Nose: Nose normal.     Mouth/Throat:     Mouth:  Mucous membranes are moist.     Pharynx: Oropharynx is clear.  Eyes:     Extraocular Movements: Extraocular movements intact.     Conjunctiva/sclera: Conjunctivae normal.  Cardiovascular:     Rate and Rhythm: Normal rate and regular rhythm.     Heart sounds: No murmur heard. Pulmonary:     Effort: Pulmonary effort is normal. No respiratory distress.     Breath sounds: Normal breath sounds. No wheezing or rales.  Chest:     Chest wall: No tenderness.  Abdominal:     General: There is no distension.     Palpations: Abdomen is soft.     Tenderness: There is no abdominal tenderness.  Musculoskeletal:        General: No swelling. Normal range of motion.  Cervical back: Normal range of motion and neck supple.     Right lower leg: No edema.     Left lower leg: No edema.  Skin:    General: Skin is warm and dry.     Capillary Refill: Capillary refill takes less than 2 seconds.     Coloration: Skin is not jaundiced or pale.  Neurological:     General: No focal deficit present.     Mental Status: He is alert and oriented to person, place, and time.     Cranial Nerves: No cranial nerve deficit.     Sensory: No sensory deficit.     Motor: No weakness.     Coordination: Coordination normal.  Psychiatric:        Mood and Affect: Mood normal.        Behavior: Behavior normal.        Thought Content: Thought content normal.        Judgment: Judgment normal.     ED Results / Procedures / Treatments   Labs (all labs ordered are listed, but only abnormal results are displayed) Labs Reviewed  COMPREHENSIVE METABOLIC PANEL - Abnormal; Notable for the following components:      Result Value   Glucose, Bld 170 (*)    BUN 26 (*)    Creatinine, Ser 1.26 (*)    Total Protein 5.9 (*)    All other components within normal limits  CBC WITH DIFFERENTIAL/PLATELET - Abnormal; Notable for the following components:   RBC 2.76 (*)    Hemoglobin 8.4 (*)    HCT 26.6 (*)    RDW 15.8 (*)    All  other components within normal limits  BLOOD GAS, VENOUS - Abnormal; Notable for the following components:   pCO2, Ven 37 (*)    pO2, Ven 104 (*)    Acid-base deficit 2.2 (*)    All other components within normal limits  I-STAT CHEM 8, ED - Abnormal; Notable for the following components:   Creatinine, Ser 1.40 (*)    Glucose, Bld 162 (*)    TCO2 20 (*)    Hemoglobin 8.8 (*)    HCT 26.0 (*)    All other components within normal limits  POC OCCULT BLOOD, ED - Abnormal; Notable for the following components:   Fecal Occult Bld POSITIVE (*)    All other components within normal limits  TROPONIN I (HIGH SENSITIVITY) - Abnormal; Notable for the following components:   Troponin I (High Sensitivity) 44 (*)    All other components within normal limits  TROPONIN I (HIGH SENSITIVITY) - Abnormal; Notable for the following components:   Troponin I (High Sensitivity) 35 (*)    All other components within normal limits  CULTURE, BLOOD (ROUTINE X 2)  CULTURE, BLOOD (ROUTINE X 2)  LACTIC ACID, PLASMA  PROTIME-INR  MAGNESIUM  URINALYSIS, ROUTINE W REFLEX MICROSCOPIC  BRAIN NATRIURETIC PEPTIDE  HEMOGLOBIN AND HEMATOCRIT, BLOOD  HEMOGLOBIN AND HEMATOCRIT, BLOOD  HEMOGLOBIN A1C  TYPE AND SCREEN    EKG None  Radiology CT Angio Chest PE W and/or Wo Contrast  Result Date: 03/08/2022 CLINICAL DATA:  Black tarry stools, weakness, dizziness, shortness of breath, upper abdominal pain, clinical suspicion of pulmonary embolism, past history of coronary artery disease, type II diabetes mellitus, GERD, atrial fibrillation, former smoker EXAM: CT ANGIOGRAPHY CHEST WITH CONTRAST TECHNIQUE: Multidetector CT imaging of the chest was performed using the standard protocol during bolus administration of intravenous contrast. Multiplanar CT image reconstructions and MIPs were  obtained to evaluate the vascular anatomy. RADIATION DOSE REDUCTION: This exam was performed according to the departmental dose-optimization  program which includes automated exposure control, adjustment of the mA and/or kV according to patient size and/or use of iterative reconstruction technique. CONTRAST:  83m OMNIPAQUE IOHEXOL 350 MG/ML SOLN IV COMPARISON:  05/06/2021 FINDINGS: Cardiovascular: Atherosclerotic calcifications aorta, proximal great vessels, and coronary arteries. Aorta normal caliber without aneurysm or dissection. Heart size normal. No pericardial effusion. Pacing leads RIGHT atrium and RIGHT ventricle. Pulmonary arteries well opacified. Filling defect identified in RIGHT lower lobe pulmonary artery consistent with pulmonary embolism. No additional pulmonary emboli identified. RV/LV ratio: 0.91, non elevated Mediastinum/Nodes: Esophagus unremarkable. No adenopathy. Base of cervical region normal appearance. Lungs/Pleura: Calcified granuloma superior segment RIGHT lower lobe image 74. Lungs otherwise clear. No pulmonary infiltrate, pleural effusion, or pneumothorax. Upper Abdomen: Dependent tiny calculi in gallbladder. Visualized upper abdomen otherwise unremarkable. Musculoskeletal: No acute osseous findings. Review of the MIP images confirms the above findings. IMPRESSION: Filling defect in RIGHT lower lobe pulmonary artery consistent with pulmonary embolism. Cholelithiasis. Scattered atherosclerotic calcifications including coronary arteries. Aortic Atherosclerosis (ICD10-I70.0). Critical Value/emergent results were called by telephone at the time of interpretation on 03/08/2022 at 1447 hrs [facility] who verbally acknowledged these results. Electronically Signed   By: MLavonia DanaM.D.   On: 03/08/2022 14:47   DG Chest Port 1 View  Result Date: 03/08/2022 CLINICAL DATA:  Questionable sepsis - evaluate for abnormality. 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 The cardiomediastinal silhouette is unchanged with normal heart size. Aortic  atherosclerosis is noted. No airspace consolidation, edema, sizable pleural effusion, or pneumothorax is identified no acute osseous abnormality is seen. IMPRESSION: No active disease. Electronically Signed   By: ALogan BoresM.D.   On: 03/08/2022 12:55    Procedures Procedures    Medications Ordered in ED Medications  sodium chloride (PF) 0.9 % injection (  Not Given 03/08/22 1433)  insulin aspart (novoLOG) injection 0-15 Units (has no administration in time range)  insulin aspart (novoLOG) injection 0-5 Units (has no administration in time range)  pantoprazole (PROTONIX) injection 40 mg (40 mg Intravenous Given 03/08/22 1818)  lactated ringers bolus 1,000 mL (0 mLs Intravenous Stopped 03/08/22 1423)  iohexol (OMNIPAQUE) 350 MG/ML injection 100 mL (80 mLs Intravenous Contrast Given 03/08/22 1413)    ED Course/ Medical Decision Making/ A&P                           Medical Decision Making Amount and/or Complexity of Data Reviewed Labs: ordered. Radiology: ordered. ECG/medicine tests: ordered.  Risk Prescription drug management. Decision regarding hospitalization.   This patient presents to the ED for concern of fatigue, shortness of breath, near syncope, and melena, this involves an extensive number of treatment options, and is a complaint that carries with it a high risk of complications and morbidity.  The differential diagnosis includes symptomatic anemia, infection, PE, deconditioning   Co morbidities that complicate the patient evaluation  Atrial fibrillation, HTN, HLD, T2DM, COPD, GI bleed, depression, anemia, CKD, OSA, neuropathy   Additional history obtained:  Additional history obtained from patient's son External records from outside source obtained and reviewed including EMR   Lab Tests:  I Ordered, and personally interpreted labs.  The pertinent results include: No drop in hemoglobin from time of discharge last week, creatinine slightly increased from  baseline, normal electrolytes,  mild elevation in troponin   Imaging Studies ordered:  I ordered imaging studies including chest x-ray, CTA chest I independently visualized and interpreted imaging which showed findings consistent with right lower lobe PE I agree with the radiologist interpretation   Cardiac Monitoring: / EKG:  The patient was maintained on a cardiac monitor.  I personally viewed and interpreted the cardiac monitored which showed an underlying rhythm of: Paced rhythm  Problem List / ED Course / Critical interventions / Medication management  Patient presents for persistent melena.  He had a recent hospitalization for the same.  During the hospitalization, he did receive 4 units PRBCs.  He has since stopped taking his Coumadin.  He does continue to take his Plavix.  Vital signs on arrival are notable for hypotension.  Patient reports 2 melanotic stools in the past 24 hours.  These were his first bowel movement since his discharge 5 days ago.  Presentation is concerning for recurrence of symptomatic anemia.  His low blood pressure could be secondary to ongoing hemorrhage.  He does take multiple blood pressure medications and did take those this morning at around 7 AM.  Polypharmacy may be contributing to his low blood pressure.  IV fluids were ordered.  On exam, his abdomen is soft and without any areas of tenderness.  He does have melanotic stool on DRE.  Laboratory workup shows increased creatinine from baseline, normal electrolytes, no change in hemoglobin from recent hospital discharge, no leukocytosis.  Troponin was slightly elevated.  Patient went CTA of chest which did show right lower lobe PE.  Given concern for recent and possibly ongoing GI bleed, no anticoagulation was initiated.  Patient remained hemodynamically stable.  Blood pressure improved after IV fluids.  Plan will be for admission to hospitalist. I ordered medication including IV fluids for soft blood  pressures Reevaluation of the patient after these medicines showed that the patient improved I have reviewed the patients home medicines and have made adjustments as needed   Social Determinants of Health:  Has access to outpatient care         Final Clinical Impression(s) / ED Diagnoses Final diagnoses:  Melena  Pulmonary embolism without acute cor pulmonale, unspecified chronicity, unspecified pulmonary embolism type [facility])    Rx / DC Orders ED Discharge Orders     None         Godfrey Pick, MD 03/08/22 1830

## 2022-03-08 NOTE — Telephone Encounter (Signed)
I spoke with pt; pt said that he will call Maida Sale to come pick him up now and take to [facility] ED. Pt declines 911 due to cost. Pt said he is OK right now because he is resting in his chair. UC & ED precautions given and pt voiced  understanding. Sending note to Gentry Fitz NP and Hedy Camara and will teams [facility] CMA.

## 2022-03-08 NOTE — ED Notes (Signed)
ED TO INPATIENT HANDOFF REPORT  ED Nurse Name and Phone #: Baxter Flattery, RN  S Name/Age/Gender [name] Cook 71 y.o. male Room/Bed: WA09/WA09  Code Status   Code Status: Prior  Home/SNF/Other Home Patient oriented to: self, place, time, and situation Is this baseline? Yes   Triage Complete: Triage complete  Chief Complaint GIB (gastrointestinal bleeding) [K92.2]  Triage Note C/o black tarry stools with weakness, dizziness, sob, and upper abd pain seen in ER for same with admission and EGD and blood transfusion.  Coumadin was stopped during admission but continued Plavix.    Allergies Allergies  Allergen Reactions   Januvia [Sitagliptin] Other (See Comments)    Elevated lipase   Metformin And Related Diarrhea    Level of Care/Admitting Diagnosis ED Disposition     ED Disposition  Admit   Condition  --   Comment  Hospital Area: [facility] [[id number]]  Level of Care: Progressive [102]  Admit to Progressive based on following criteria: GI, ENDOCRINE disease patients with GI bleeding, acute liver failure or pancreatitis, stable with diabetic ketoacidosis or thyrotoxicosis (hypothyroid) state.  May place patient in observation at [facility] or [facility] if equivalent level of care is available:: No  Covid Evaluation: Asymptomatic - no recent exposure (last 10 days) testing not required  Diagnosis: GIB (gastrointestinal bleeding) [836629]  Admitting Physician: Jonnie Finner [[id number]]  Attending Physician: Jonnie Finner [[id number]]          B Medical/Surgery History Past Medical History:  Diagnosis Date   Acute bronchitis with COPD ([place]) 03/07/2021   Arthritis    "knees and lower back" (03/14/2013)   Atrial flutter ([place])    radiofrequency ablation in 2001   CAD (coronary artery disease)    a. Nonobstructive. Cardiac cath in 2001-50% mid RI, normal LM, LAD, RCA b. cath 10/16/2014 95% mid RCA treated with DES, 99% ost D1 medical management due to small  aneurysmal segment   Chronic anticoagulation    chronic Coumadin anticoagulation   Chronic obstructive pulmonary disease ([place]) 04/20/2011   Diabetes mellitus, type 2 (HCC)    Elevated lipase 06/23/2021   GERD (gastroesophageal reflux disease)    Hyperlipidemia    Hypertension    with hypertensive heart disease   Left knee pain 10/25/2017   medial   Obesity    Persistent atrial fibrillation ([place])    recurrent atrial flutter since 2001 s/p DCCVs, multiple failed AADs, h/o tachy-mediated cardiomyopathy   Shortness of breath    "can come on at any time" (03/14/2013)   Sleep apnea    "dx'd; couldn't wear the mask" (03/14/2013)   Tobacco abuse    Past Surgical History:  Procedure Laterality Date   ATRIAL FLUTTER ABLATION  2002   atrial flutter; subsequently developed atrial fibrillation   AV NODE ABLATION  01/24/2013   CARDIAC CATHETERIZATION  2002   CARDIAC CATHETERIZATION N/A 10/16/2014   Procedure: Left Heart Cath and Coronary Angiography;  Surgeon: [name], MD;  Location: [place] CV LAB;  Service: Cardiovascular;  Laterality: N/A;   CARDIOVERSION  05/31/2011   Procedure: CARDIOVERSION;  Surgeon: Cristopher Estimable. Lattie Haw, MD;  Location: [facility];  Service: Cardiovascular;  Laterality: N/A;   CARPAL TUNNEL RELEASE Left 1980's   COLONOSCOPY WITH PROPOFOL N/A 12/30/2018   Procedure: COLONOSCOPY WITH PROPOFOL;  Surgeon: Jonathon Bellows, MD;  Location: [facility] ENDOSCOPY;  Service: Gastroenterology;  Laterality: N/A;   COLONOSCOPY WITH PROPOFOL N/A 12/04/2019   Procedure: COLONOSCOPY WITH PROPOFOL;  Surgeon: Jonathon Bellows,  MD;  Location: [facility] ENDOSCOPY;  Service: Gastroenterology;  Laterality: N/A;   COLONOSCOPY WITH PROPOFOL N/A 06/13/2021   Procedure: COLONOSCOPY WITH PROPOFOL;  Surgeon: Jonathon Bellows, MD;  Location: [facility] ENDOSCOPY;  Service: Gastroenterology;  Laterality: N/A;   ESOPHAGOGASTRODUODENOSCOPY N/A 03/01/2022   Procedure: ESOPHAGOGASTRODUODENOSCOPY (EGD);  Surgeon: Clarene Essex, MD;   Location: [facility];  Service: Gastroenterology;  Laterality: N/A;   INSERT / REPLACE / REMOVE PACEMAKER  01/24/2013    Medtronic Adapta L dual-chamber pacemaker, serial number NWE [id number]    LEFT HEART CATH AND CORONARY ANGIOGRAPHY N/A 06/07/2018   Procedure: LEFT HEART CATH AND CORONARY ANGIOGRAPHY;  Surgeon: [name], MD;  Location: [place] CV LAB;  Service: Cardiovascular;  Laterality: N/A;   LEFT HEART CATHETERIZATION WITH CORONARY ANGIOGRAM N/A 03/17/2013   Procedure: LEFT HEART CATHETERIZATION WITH CORONARY ANGIOGRAM;  Surgeon: Burnell Blanks, MD; LAD mild dz, D1 branch 100%, inferior branch 99%, CFX OK, RCA 50%, EF 65%     LOOP RECORDER IMPLANT  2002   PERMANENT PACEMAKER INSERTION N/A 01/24/2013   Procedure: PERMANENT PACEMAKER INSERTION;  Surgeon: [name], MD;  Location: [facility] CATH LAB;  Service: Cardiovascular;  Laterality: N/A;   TIBIAL TUBERCLERPLASTY  ~ 2003     A IV Location/Drains/Wounds Patient Lines/Drains/Airways Status     Active Line/Drains/Airways     Name Placement date Placement time Site Days   Peripheral IV 03/08/22 20 G 1" Anterior;Right Forearm 03/08/22  1254  Forearm  less than 1            Intake/Output Last 24 hours No intake or output data in the 24 hours ending 03/08/22 1824  Labs/Imaging Results for orders placed or performed during the hospital encounter of 03/08/22 (from the past 48 hour(s))  POC occult blood, ED     Status: Abnormal   Collection Time: 03/08/22 12:23 PM  Result Value Ref Range   Fecal Occult Bld POSITIVE (A) NEGATIVE  Blood gas, venous ([facility], [facility], [facility])     Status: Abnormal   Collection Time: 03/08/22 12:30 PM  Result Value Ref Range   pH, Ven 7.39 7.25 - 7.43   pCO2, Ven 37 (L) 44 - 60 mmHg   pO2, Ven 104 (H) 32 - 45 mmHg   Bicarbonate 22.4 20.0 - 28.0 mmol/L   Acid-base deficit 2.2 (H) 0.0 - 2.0 mmol/L   O2 Saturation 100 %   Patient temperature 37.0     Comment: Performed at [facility], [place] [address] [place], [place] [address]  Lactic acid, plasma     Status: None   Collection Time: 03/08/22 12:33 PM  Result Value Ref Range   Lactic Acid, Venous 1.7 0.5 - 1.9 mmol/L    Comment: Performed at [facility], [place] [address] [place], [place] [address]  Comprehensive metabolic panel     Status: Abnormal   Collection Time: 03/08/22 12:33 PM  Result Value Ref Range   Sodium 137 135 - 145 mmol/L   Potassium 4.4 3.5 - 5.1 mmol/L   Chloride 107 98 - 111 mmol/L   CO2 22 22 - 32 mmol/L   Glucose, Bld 170 (H) 70 - 99 mg/dL    Comment: Glucose reference range applies only to samples taken after fasting for at least 8 hours.   BUN 26 (H) 8 - 23 mg/dL   Creatinine, Ser 1.26 (H) 0.61 - 1.24 mg/dL   Calcium 9.2 8.9 - 10.3 mg/dL   Total Protein 5.9 (L) 6.5 -  8.1 g/dL   Albumin 3.5 3.5 - 5.0 g/dL   AST 17 15 - 41 U/L   ALT 18 0 - 44 U/L   Alkaline Phosphatase 46 38 - 126 U/L   Total Bilirubin 0.7 0.3 - 1.2 mg/dL   GFR, Estimated >60 >60 mL/min    Comment: (NOTE) Calculated using the CKD-EPI Creatinine Equation (2021)    Anion gap 8 5 - 15    Comment: Performed at [facility], [place] [address] [place], [place] [address]  CBC with Differential     Status: Abnormal   Collection Time: 03/08/22 12:33 PM  Result Value Ref Range   WBC 4.2 4.0 - 10.5 K/uL   RBC 2.76 (L) 4.22 - 5.81 MIL/uL   Hemoglobin 8.4 (L) 13.0 - 17.0 g/dL   HCT 26.6 (L) 39.0 - 52.0 %   MCV 96.4 80.0 - 100.0 fL   MCH 30.4 26.0 - 34.0 pg   MCHC 31.6 30.0 - 36.0 g/dL   RDW 15.8 (H) 11.5 - 15.5 %   Platelets 177 150 - 400 K/uL   nRBC 0.0 0.0 - 0.2 %   Neutrophils Relative % 64 %   Neutro Abs 2.8 1.7 - 7.7 K/uL   Lymphocytes Relative 23 %   Lymphs Abs 1.0 0.7 - 4.0 K/uL   Monocytes Relative 10 %   Monocytes Absolute 0.4 0.1 - 1.0 K/uL   Eosinophils Relative 2 %   Eosinophils Absolute 0.1 0.0 - 0.5 K/uL   Basophils Relative 1 %   Basophils Absolute  0.0 0.0 - 0.1 K/uL   Immature Granulocytes 0 %   Abs Immature Granulocytes 0.01 0.00 - 0.07 K/uL    Comment: Performed at [facility], [place] [address] [place], [place] [address]  Protime-INR     Status: None   Collection Time: 03/08/22 12:33 PM  Result Value Ref Range   Prothrombin Time 13.6 11.4 - 15.2 seconds   INR 1.1 0.8 - 1.2    Comment: (NOTE) INR goal varies based on device and disease states. Performed at [facility], [place] [address] [place], [place] [address]   Troponin I (High Sensitivity)     Status: Abnormal   Collection Time: 03/08/22 12:33 PM  Result Value Ref Range   Troponin I (High Sensitivity) 44 (H) <18 ng/L    Comment: (NOTE) Elevated high sensitivity troponin I (hsTnI) values and significant  changes across serial measurements may suggest ACS but many other  chronic and acute conditions are known to elevate hsTnI results.  Refer to the "Links" section for chest pain algorithms and additional  guidance. Performed at [facility], [place] [address] [place], [place] [address]   Type and screen [place]     Status: None   Collection Time: 03/08/22 12:33 PM  Result Value Ref Range   ABO/RH(D) O POS    Antibody Screen NEG    Sample Expiration      03/11/2022,2359 Performed at [facility], [place] [address], [place], [place] [address]   Magnesium     Status: None   Collection Time: 03/08/22 12:33 PM  Result Value Ref Range   Magnesium 2.1 1.7 - 2.4 mg/dL    Comment: Performed at [facility], [place] [address] [place], [place] [address]  I-stat chem 8, ED     Status: Abnormal   Collection Time: 03/08/22 12:40 PM  Result Value Ref Range   Sodium 137  135 - 145 mmol/L   Potassium 4.4 3.5 - 5.1 mmol/L   Chloride 105 98 - 111 mmol/L   BUN 23 8 - 23 mg/dL   Creatinine, Ser 1.40 (H) 0.61 - 1.24 mg/dL   Glucose, Bld 162 (H) 70 - 99 mg/dL    Comment:  Glucose reference range applies only to samples taken after fasting for at least 8 hours.   Calcium, Ion 1.22 1.15 - 1.40 mmol/L   TCO2 20 (L) 22 - 32 mmol/L   Hemoglobin 8.8 (L) 13.0 - 17.0 g/dL   HCT 26.0 (L) 39.0 - 52.0 %  Blood Culture (routine x 2)     Status: None (Preliminary result)   Collection Time: 03/08/22 12:46 PM   Specimen: BLOOD RIGHT ARM  Result Value Ref Range   Specimen Description      BLOOD RIGHT ARM Performed at [place] [address], [place], [place] [address]    Special Requests      BOTTLES DRAWN AEROBIC AND ANAEROBIC Blood Culture results may not be optimal due to an excessive volume of blood received in culture bottles Performed at [facility] [address], [place], [place] [address]    Culture PENDING    Report Status PENDING   Troponin I (High Sensitivity)     Status: Abnormal   Collection Time: 03/08/22  2:26 PM  Result Value Ref Range   Troponin I (High Sensitivity) 35 (H) <18 ng/L    Comment: (NOTE) Elevated high sensitivity troponin I (hsTnI) values and significant  changes across serial measurements may suggest ACS but many other  chronic and acute conditions are known to elevate hsTnI results.  Refer to the "Links" section for chest pain algorithms and additional  guidance. Performed at [facility], [place] [address], [place], [place] [address]    *Note: Due to a large number of results and/or encounters for the requested time period, some results have not been displayed. A complete set of results can be found in Results Review.   CT Angio Chest PE W and/or Wo Contrast  Result Date: 03/08/2022 CLINICAL DATA:  Black tarry stools, weakness, dizziness, shortness of breath, upper abdominal pain, clinical suspicion of pulmonary embolism, past history of coronary artery disease, type II diabetes mellitus, GERD, atrial fibrillation, former smoker EXAM: CT ANGIOGRAPHY CHEST WITH CONTRAST TECHNIQUE:  Multidetector CT imaging of the chest was performed using the standard protocol during bolus administration of intravenous contrast. Multiplanar CT image reconstructions and MIPs were obtained to evaluate the vascular anatomy. RADIATION DOSE REDUCTION: This exam was performed according to the departmental dose-optimization program which includes automated exposure control, adjustment of the mA and/or kV according to patient size and/or use of iterative reconstruction technique. CONTRAST:  32m OMNIPAQUE IOHEXOL 350 MG/ML SOLN IV COMPARISON:  05/06/2021 FINDINGS: Cardiovascular: Atherosclerotic calcifications aorta, proximal great vessels, and coronary arteries. Aorta normal caliber without aneurysm or dissection. Heart size normal. No pericardial effusion. Pacing leads RIGHT atrium and RIGHT ventricle. Pulmonary arteries well opacified. Filling defect identified in RIGHT lower lobe pulmonary artery consistent with pulmonary embolism. No additional pulmonary emboli identified. RV/LV ratio: 0.91, non elevated Mediastinum/Nodes: Esophagus unremarkable. No adenopathy. Base of cervical region normal appearance. Lungs/Pleura: Calcified granuloma superior segment RIGHT lower lobe image 74. Lungs otherwise clear. No pulmonary infiltrate, pleural effusion, or pneumothorax. Upper Abdomen: Dependent tiny calculi in gallbladder. Visualized upper abdomen otherwise unremarkable. Musculoskeletal: No acute osseous findings. Review of the MIP images confirms the above findings. IMPRESSION:  Filling defect in RIGHT lower lobe pulmonary artery consistent with pulmonary embolism. Cholelithiasis. Scattered atherosclerotic calcifications including coronary arteries. Aortic Atherosclerosis (ICD10-I70.0). Critical Value/emergent results were called by telephone at the time of interpretation on 03/08/2022 at 1447 hrs to provider [facility] , who verbally acknowledged these results. Electronically Signed   By: Lavonia Dana M.D.   On:  03/08/2022 14:47   DG Chest Port 1 View  Result Date: 03/08/2022 CLINICAL DATA:  Questionable sepsis - evaluate for abnormality. 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 The cardiomediastinal silhouette is unchanged with normal heart size. Aortic atherosclerosis is noted. No airspace consolidation, edema, sizable pleural effusion, or pneumothorax is identified no acute osseous abnormality is seen. IMPRESSION: No active disease. Electronically Signed   By: Logan Bores M.D.   On: 03/08/2022 12:55    Pending Labs Unresulted Labs (From admission, onward)     Start     Ordered   03/08/22 1807  Hemoglobin A1c  Once,   R       Comments: To assess prior glycemic control    03/08/22 1806   03/08/22 1800  Hemoglobin and hematocrit, blood  Every 6 hours,   R (with STAT occurrences)      03/08/22 1750   03/08/22 1750  Brain natriuretic peptide  Once,   URGENT        03/08/22 1749   03/08/22 1218  Blood Culture (routine x 2)  (Undifferentiated presentation (screening labs and basic nursing orders))  BLOOD CULTURE X 2,   STAT      03/08/22 1218   03/08/22 1218  Urinalysis, Routine w reflex microscopic  (Undifferentiated presentation (screening labs and basic nursing orders))  ONCE - URGENT,   URGENT        03/08/22 1218   Signed and Held  Comprehensive metabolic panel  Tomorrow morning,   R        Signed and Held   Signed and Held  CBC  Tomorrow morning,   R        Signed and Held            Vitals/Pain Today's Vitals   03/08/22 1530 03/08/22 1600 03/08/22 1630 03/08/22 1700  BP: (!) 114/57 (!) 115/59 107/61 (!) 99/59  Pulse: 71 81 70 69  Resp: 20   18  Temp:    98.1 F (36.7 C)  TempSrc:    Oral  SpO2: 98% 98% 97% 100%  PainSc:        Isolation Precautions No active isolations  Medications Medications  sodium chloride (PF) 0.9 % injection (  Not Given 03/08/22 1433)  insulin aspart (novoLOG)  injection 0-15 Units (has no administration in time range)  insulin aspart (novoLOG) injection 0-5 Units (has no administration in time range)  pantoprazole (PROTONIX) injection 40 mg (40 mg Intravenous Given 03/08/22 1818)  lactated ringers bolus 1,000 mL (0 mLs Intravenous Stopped 03/08/22 1423)  iohexol (OMNIPAQUE) 350 MG/ML injection 100 mL (80 mLs Intravenous Contrast Given 03/08/22 1413)    Mobility walks with device Low fall risk   Focused Assessments Cardiac Assessment Handoff:    Lab Results  Component Value Date   CKTOTAL 86 01/22/2013   CKMB 0.7 01/22/2013   TROPONINI <0.03 06/05/2018   No results found for: "DDIMER" Does the Patient currently have chest pain? No    R Recommendations: See Admitting Provider Note  Report given to:   Additional Notes:  Clear liquid diet

## 2022-03-08 NOTE — Telephone Encounter (Signed)
Patient currently in the ED.  Will await notes.

## 2022-03-09 ENCOUNTER — Observation Stay (HOSPITAL_COMMUNITY): Payer: Medicare Other

## 2022-03-09 DIAGNOSIS — E669 Obesity, unspecified: Secondary | ICD-10-CM | POA: Diagnosis present

## 2022-03-09 DIAGNOSIS — I2510 Atherosclerotic heart disease of native coronary artery without angina pectoris: Secondary | ICD-10-CM | POA: Diagnosis present

## 2022-03-09 DIAGNOSIS — Z87891 Personal history of nicotine dependence: Secondary | ICD-10-CM | POA: Diagnosis not present

## 2022-03-09 DIAGNOSIS — F3341 Major depressive disorder, recurrent, in partial remission: Secondary | ICD-10-CM | POA: Diagnosis not present

## 2022-03-09 DIAGNOSIS — Z7902 Long term (current) use of antithrombotics/antiplatelets: Secondary | ICD-10-CM | POA: Diagnosis not present

## 2022-03-09 DIAGNOSIS — Z7901 Long term (current) use of anticoagulants: Secondary | ICD-10-CM | POA: Diagnosis not present

## 2022-03-09 DIAGNOSIS — I10 Essential (primary) hypertension: Secondary | ICD-10-CM | POA: Diagnosis not present

## 2022-03-09 DIAGNOSIS — K219 Gastro-esophageal reflux disease without esophagitis: Secondary | ICD-10-CM | POA: Diagnosis present

## 2022-03-09 DIAGNOSIS — E66811 Obesity, class 1: Secondary | ICD-10-CM | POA: Diagnosis present

## 2022-03-09 DIAGNOSIS — I2699 Other pulmonary embolism without acute cor pulmonale: Secondary | ICD-10-CM

## 2022-03-09 DIAGNOSIS — I82442 Acute embolism and thrombosis of left tibial vein: Secondary | ICD-10-CM

## 2022-03-09 DIAGNOSIS — I4819 Other persistent atrial fibrillation: Secondary | ICD-10-CM

## 2022-03-09 DIAGNOSIS — Z950 Presence of cardiac pacemaker: Secondary | ICD-10-CM | POA: Diagnosis not present

## 2022-03-09 DIAGNOSIS — R0602 Shortness of breath: Secondary | ICD-10-CM

## 2022-03-09 DIAGNOSIS — I82812 Embolism and thrombosis of superficial veins of left lower extremities: Secondary | ICD-10-CM | POA: Diagnosis not present

## 2022-03-09 DIAGNOSIS — Z8546 Personal history of malignant neoplasm of prostate: Secondary | ICD-10-CM | POA: Diagnosis not present

## 2022-03-09 DIAGNOSIS — Z86718 Personal history of other venous thrombosis and embolism: Secondary | ICD-10-CM | POA: Diagnosis not present

## 2022-03-09 DIAGNOSIS — K921 Melena: Secondary | ICD-10-CM | POA: Diagnosis not present

## 2022-03-09 DIAGNOSIS — N179 Acute kidney failure, unspecified: Secondary | ICD-10-CM | POA: Diagnosis not present

## 2022-03-09 DIAGNOSIS — D649 Anemia, unspecified: Secondary | ICD-10-CM | POA: Diagnosis not present

## 2022-03-09 DIAGNOSIS — I4821 Permanent atrial fibrillation: Secondary | ICD-10-CM | POA: Diagnosis present

## 2022-03-09 DIAGNOSIS — K5900 Constipation, unspecified: Secondary | ICD-10-CM | POA: Diagnosis present

## 2022-03-09 DIAGNOSIS — E1165 Type 2 diabetes mellitus with hyperglycemia: Secondary | ICD-10-CM | POA: Diagnosis not present

## 2022-03-09 DIAGNOSIS — J439 Emphysema, unspecified: Secondary | ICD-10-CM

## 2022-03-09 DIAGNOSIS — I82492 Acute embolism and thrombosis of other specified deep vein of left lower extremity: Secondary | ICD-10-CM | POA: Diagnosis present

## 2022-03-09 DIAGNOSIS — E785 Hyperlipidemia, unspecified: Secondary | ICD-10-CM | POA: Diagnosis present

## 2022-03-09 DIAGNOSIS — F32A Depression, unspecified: Secondary | ICD-10-CM | POA: Diagnosis present

## 2022-03-09 DIAGNOSIS — F419 Anxiety disorder, unspecified: Secondary | ICD-10-CM | POA: Diagnosis present

## 2022-03-09 DIAGNOSIS — J449 Chronic obstructive pulmonary disease, unspecified: Secondary | ICD-10-CM | POA: Diagnosis present

## 2022-03-09 DIAGNOSIS — K8020 Calculus of gallbladder without cholecystitis without obstruction: Secondary | ICD-10-CM | POA: Diagnosis present

## 2022-03-09 DIAGNOSIS — K922 Gastrointestinal hemorrhage, unspecified: Secondary | ICD-10-CM | POA: Diagnosis not present

## 2022-03-09 DIAGNOSIS — Z7984 Long term (current) use of oral hypoglycemic drugs: Secondary | ICD-10-CM | POA: Diagnosis not present

## 2022-03-09 DIAGNOSIS — Z79899 Other long term (current) drug therapy: Secondary | ICD-10-CM | POA: Diagnosis not present

## 2022-03-09 HISTORY — DX: Other pulmonary embolism without acute cor pulmonale: I26.99

## 2022-03-09 LAB — GLUCOSE, CAPILLARY
Glucose-Capillary: 136 mg/dL — ABNORMAL HIGH (ref 70–99)
Glucose-Capillary: 132 mg/dL — ABNORMAL HIGH (ref 70–99)
Glucose-Capillary: 181 mg/dL — ABNORMAL HIGH (ref 70–99)
Glucose-Capillary: 83 mg/dL (ref 70–99)

## 2022-03-09 LAB — COMPREHENSIVE METABOLIC PANEL
ALT: 16 U/L (ref 0–44)
AST: 13 U/L — ABNORMAL LOW (ref 15–41)
Albumin: 3.3 g/dL — ABNORMAL LOW (ref 3.5–5.0)
Alkaline Phosphatase: 40 U/L (ref 38–126)
Anion gap: 7 (ref 5–15)
BUN: 19 mg/dL (ref 8–23)
CO2: 23 mmol/L (ref 22–32)
Calcium: 8.6 mg/dL — ABNORMAL LOW (ref 8.9–10.3)
Chloride: 111 mmol/L (ref 98–111)
Creatinine, Ser: 1.02 mg/dL (ref 0.61–1.24)
GFR, Estimated: >60 mL/min (ref >60)
Glucose, Bld: 96 mg/dL (ref 70–99)
Potassium: 3.8 mmol/L (ref 3.5–5.1)
Sodium: 141 mmol/L (ref 135–145)
Total Bilirubin: 0.6 mg/dL (ref 0.3–1.2)
Total Protein: 5.4 g/dL — ABNORMAL LOW (ref 6.5–8.1)

## 2022-03-09 LAB — CBC
HCT: 26 % — ABNORMAL LOW (ref 39.0–52.0)
Hemoglobin: 8.2 g/dL — ABNORMAL LOW (ref 13.0–17.0)
MCH: 30.3 pg (ref 26.0–34.0)
MCHC: 31.5 g/dL (ref 30.0–36.0)
MCV: 95.9 fL (ref 80.0–100.0)
Platelets: 153 10*3/uL (ref 150–400)
RBC: 2.71 MIL/uL — ABNORMAL LOW (ref 4.22–5.81)
RDW: 15.7 % — ABNORMAL HIGH (ref 11.5–15.5)
WBC: 3.3 10*3/uL — ABNORMAL LOW (ref 4.0–10.5)
nRBC: 0 % (ref 0.0–0.2)

## 2022-03-09 LAB — HEPARIN LEVEL (UNFRACTIONATED): Heparin Unfractionated: 1.05 IU/mL — ABNORMAL HIGH (ref 0.30–0.70)

## 2022-03-09 LAB — HEMOGLOBIN AND HEMATOCRIT, BLOOD
HCT: 27.3 % — ABNORMAL LOW (ref 39.0–52.0)
HCT: 28.2 % — ABNORMAL LOW (ref 39.0–52.0)
Hemoglobin: 8.3 g/dL — ABNORMAL LOW (ref 13.0–17.0)
Hemoglobin: 8.6 g/dL — ABNORMAL LOW (ref 13.0–17.0)

## 2022-03-09 LAB — ECHOCARDIOGRAM COMPLETE
Height: 71 in
S' Lateral: 3.1 cm
Weight: 3477.98 oz

## 2022-03-09 MED ORDER — HEPARIN BOLUS VIA INFUSION
2000.0000 [IU] | Freq: Once | INTRAVENOUS | Status: AC
  Administered 2022-03-09: 2000 [IU] via INTRAVENOUS
  Filled 2022-03-09: qty 2000

## 2022-03-09 MED ORDER — HEPARIN (PORCINE) 25000 UT/250ML-% IV SOLN
1350.0000 [IU]/h | INTRAVENOUS | Status: DC
  Administered 2022-03-09 – 2022-03-10 (×2): 1350 [IU]/h via INTRAVENOUS
  Filled 2022-03-09: qty 250

## 2022-03-09 MED ORDER — HEPARIN (PORCINE) 25000 UT/250ML-% IV SOLN
1550.0000 [IU]/h | INTRAVENOUS | Status: DC
  Administered 2022-03-09: 1550 [IU]/h via INTRAVENOUS
  Filled 2022-03-09: qty 250

## 2022-03-09 MED ORDER — GABAPENTIN 300 MG PO CAPS
300.0000 mg | ORAL_CAPSULE | Freq: Two times a day (BID) | ORAL | Status: DC
  Administered 2022-03-09 – 2022-03-12 (×7): 300 mg via ORAL
  Filled 2022-03-09 (×7): qty 1

## 2022-03-09 MED ORDER — PANTOPRAZOLE SODIUM 40 MG IV SOLR
40.0000 mg | Freq: Two times a day (BID) | INTRAVENOUS | Status: DC
  Administered 2022-03-09 – 2022-03-11 (×6): 40 mg via INTRAVENOUS
  Filled 2022-03-09 (×6): qty 10

## 2022-03-09 MED ORDER — SERTRALINE HCL 50 MG PO TABS
50.0000 mg | ORAL_TABLET | Freq: Every day | ORAL | Status: DC
  Administered 2022-03-09 – 2022-03-12 (×4): 50 mg via ORAL
  Filled 2022-03-09 (×4): qty 1

## 2022-03-09 NOTE — Progress Notes (Signed)
Bilateral lower extremity venous duplex has been completed. Preliminary results can be found in CV Proc through chart review.  Results were given to the patient's nurse, Adonis Brook.  03/09/22 1:00 PM Carlos Levering RVT

## 2022-03-09 NOTE — Progress Notes (Signed)
ANTICOAGULATION CONSULT NOTE - Initial Consult  Pharmacy Consult for Heparin Indication: atrial fibrillation, pulmonary embolus, and DVT  Allergies  Allergen Reactions   Januvia [Sitagliptin] Other (See Comments)    Elevated lipase   Metformin And Related Diarrhea    Patient Measurements: Height: _0  (180.3 cm) Weight: 98.6 kg (217 lb 6 oz) IBW/kg (Calculated) : 75.3 Heparin Dosing Weight: 95 kg  Vital Signs: Temp: 98.1 F (36.7 C) (11/23 2046) Temp Source: Oral (11/23 2046) BP: 112/70 (11/23 2046) Pulse Rate: 70 (11/23 2046)  Labs: Recent Labs    03/08/22 1233 03/08/22 1240 03/08/22 1426 03/08/22 1800 03/09/22 0103 03/09/22 1439 03/09/22 1900 03/09/22 2112  HGB 8.4* 8.8*  --    < > 8.2* 8.3* 8.6*  --   HCT 26.6* 26.0*  --    < > 26.0* 27.3* 28.2*  --   PLT 177  --   --   --  153  --   --   --   LABPROT 13.6  --   --   --   --   --   --   --   INR 1.1  --   --   --   --   --   --   --   HEPARINUNFRC  --   --   --   --   --   --   --  1.05*  CREATININE 1.26* 1.40*  --   --  1.02  --   --   --   TROPONINIHS 44*  --  35*  --   --   --   --   --    < > = values in this interval not displayed.     Estimated Creatinine Clearance: 81.8 mL/min (by C-G formula based on SCr of 1.02 mg/dL).   Medical History: Past Medical History:  Diagnosis Date   Acute bronchitis with COPD ([place]) 03/07/2021   Arthritis    "knees and lower back" (03/14/2013)   Atrial flutter ([place])    radiofrequency ablation in 2001   CAD (coronary artery disease)    a. Nonobstructive. Cardiac cath in 2001-50% mid RI, normal LM, LAD, RCA b. cath 10/16/2014 95% mid RCA treated with DES, 99% ost D1 medical management due to small aneurysmal segment   Chronic anticoagulation    chronic Coumadin anticoagulation   Chronic obstructive pulmonary disease ([place]) 04/20/2011   Diabetes mellitus, type 2 (HCC)    Elevated lipase 06/23/2021   GERD (gastroesophageal reflux disease)    Hyperlipidemia     Hypertension    with hypertensive heart disease   Left knee pain 10/25/2017   medial   Obesity    Persistent atrial fibrillation ([place])    recurrent atrial flutter since 2001 s/p DCCVs, multiple failed AADs, h/o tachy-mediated cardiomyopathy   Shortness of breath    "can come on at any time" (03/14/2013)   Sleep apnea    "dx'd; couldn't wear the mask" (03/14/2013)   Tobacco abuse     Medications:  Medications Prior to Admission  Medication Sig Dispense Refill Last Dose   albuterol (VENTOLIN HFA) 108 (90 Base) MCG/ACT inhaler INHALE 2 PUFFS BY MOUTH EVERY 4 HOURS AS NEEDED FOR WHEEZE OR FOR SHORTNESS OF BREATH (Patient taking differently: Inhale 2 puffs into the lungs every 4 (four) hours as needed for wheezing or shortness of breath.) 8.5 each 0 Past Month   Ascorbic Acid (VITAMIN C) 1000 MG tablet Take 2,000 mg by mouth daily.  03/08/2022   budesonide-formoterol (SYMBICORT) 160-4.5 MCG/ACT inhaler INHALE 2 PUFFS INTO THE LUNGS TWICE A DAY (Patient taking differently: Inhale 2 puffs into the lungs 2 (two) times daily as needed ("for flares").) 10.2 each 5 Past Month   clopidogrel (PLAVIX) 75 MG tablet TAKE 1 TABLET BY MOUTH EVERY DAY (Patient taking differently: Take 75 mg by mouth daily.) 90 tablet 2 03/08/2022   fish oil-omega-3 fatty acids 1000 MG capsule Take 2 g by mouth daily.   03/08/2022   fluticasone (FLONASE) 50 MCG/ACT nasal spray PLACE 1 SPRAY INTO BOTH NOSTRILS DAILY AS NEEDED FOR ALLERGIES OR RHINITIS. (Patient taking differently: Place 1 spray into both nostrils daily as needed for allergies or rhinitis.) 48 mL 3 Past Week   furosemide (LASIX) 20 MG tablet TAKE 1 TABLET BY MOUTH EVERY DAY (Patient taking differently: Take 20 mg by mouth in the morning.) 90 tablet 2 03/08/2022   gabapentin (NEURONTIN) 300 MG capsule TAKE 1 CAPSULE BY MOUTH EVERY MORNING AND 2 CAPSULES BY MOUTH AT NIGHT FOR NEUROPATHY (Patient taking differently: Take 300 mg by mouth in the morning and at  bedtime.) 270 capsule 3 03/08/2022   glipiZIDE (GLUCOTROL XL) 10 MG 24 hr tablet TAKE 1 TABLET (10 MG TOTAL) BY MOUTH DAILY WITH BREAKFAST. FOR DIABETES. (Patient taking differently: Take 10 mg by mouth daily with breakfast.) 90 tablet 1 03/08/2022   isosorbide mononitrate (IMDUR) 60 MG 24 hr tablet TAKE 1 TABLET BY MOUTH EVERY DAY (Patient taking differently: Take 60 mg by mouth daily.) 90 tablet 1 03/08/2022   JARDIANCE 25 MG TABS tablet TAKE 1 TABLET (25 MG TOTAL) BY MOUTH DAILY BEFORE BREAKFAST. FOR DIABETES. (Patient taking differently: 25 mg daily before breakfast.) 90 tablet 1 03/08/2022   lisinopril (ZESTRIL) 2.5 MG tablet TAKE 1 TABLET BY MOUTH EVERY DAY (Patient taking differently: Take 2.5 mg by mouth in the morning.) 90 tablet 3 03/08/2022   nitroGLYCERIN (NITROSTAT) 0.4 MG SL tablet Place 1 tablet (0.4 mg total) under the tongue every 5 (five) minutes as needed for chest pain. 25 tablet 0 unknown   pantoprazole (PROTONIX) 40 MG tablet Take 1 tablet (40 mg total) by mouth 2 (two) times daily. 60 tablet 2 03/08/2022   rosuvastatin (CRESTOR) 20 MG tablet TAKE 1 TABLET BY MOUTH EVERY DAY IN THE EVENING FOR CHOLESTEROL (Patient taking differently: Take 20 mg by mouth at bedtime.) 90 tablet 3 03/07/2022   sertraline (ZOLOFT) 50 MG tablet TAKE 1 TABLET (50 MG TOTAL) BY MOUTH DAILY. FOR ANXIETY AND DEPRESSION. (Patient taking differently: Take 50 mg by mouth daily.) 90 tablet 2 03/08/2022   tamsulosin (FLOMAX) 0.4 MG CAPS capsule TAKE 1 CAPSULE (0.4 MG TOTAL) BY MOUTH 2 (TWO) TIMES DAILY. (Patient taking differently: Take 0.4 mg by mouth at bedtime.) 180 capsule 3 03/07/2022   TYLENOL 500 MG tablet Take 500-1,000 mg by mouth every 6 (six) hours as needed for mild pain or headache.   Past Month   blood glucose meter kit and supplies KIT Dispense based on patient and insurance preference. Use up to four times daily as directed. (FOR ICD-9 250.00, 250.01). 1 each 0    glucose blood test strip 1 each by  Other route as needed for other. Use to check blood sugar up to three times a day-Accu-Chek meter      Infusions:   heparin      Assessment: 15 yoM admitted on 11/22 for new/worsening melena, ShOB and found to have PE.  Initially he was not started  on anticoagulation due to bleeding.  PMH is significant for afib previously on warfarin anticoagulation that was stopped during recent admission (11/13-11/17) for melena, erosive gastritis requiring Vitamin K reversal and PRBC transfusion (11/13, 11/14, 11/16) 11/22 CT:  RLL PE.  Baseline labs: Hgb 7.7, INR 1.1 11/23 CBC: Hgb improved to 8.2, Dopplers + LLE DVT.  Pharmacy is consulted to dose Heparin IV.  No further bleeding reported today. Per Dr. Cathlean Sauer, [place] to give initial bolus and keep normal goal range.  03/09/22 Heparin level = 1.05 (supratherapeutic) with heparin gtt @ 1550 units/hr 19:00 Hgb = 8.6 (stable) No complications of therapy noted RN confirmed heparin level was drawn from arm opposite heparin infusion  Goal of Therapy:  Heparin level 0.3-0.7 units/ml Monitor platelets by anticoagulation protocol: Yes   Plan:  Hold heparin gtt x 1 hr then restart infusion at reduced rate of 1350 units/hr Heparin level 6 hours after restarting heparin  Daily heparin level and CBC  Leone Haven, PharmD 03/09/2022 10:48 PM

## 2022-03-09 NOTE — Assessment & Plan Note (Signed)
Follow up as outpatient.  

## 2022-03-09 NOTE — Assessment & Plan Note (Signed)
Calculated BMI 30,3

## 2022-03-09 NOTE — Assessment & Plan Note (Signed)
Continue sertraline 

## 2022-03-09 NOTE — Assessment & Plan Note (Addendum)
Patient with no ulcers on recent GI evaluation. Follow up hgb is 9.1   Continue IV pantoprazole for now. Continue H&H monitoring q 8 hrs for now Follow up with capsule endoscopy results.

## 2022-03-09 NOTE — Progress Notes (Signed)
  Echocardiogram 2D Echocardiogram has been performed.  [name] Cook 03/09/2022, 8:34 AM

## 2022-03-09 NOTE — Assessment & Plan Note (Addendum)
Continue blood pressure monitoring, at home patient on lisinopril and furosemide.  Systolic blood pressure 652 to 105 mmHg.  Continue to hold on antihypertensive medications.

## 2022-03-09 NOTE — Progress Notes (Signed)
Progress Note   Patient: [name] Cook TUU:[id number] DOB: 07-Jul-1950 DOA: 03/08/2022     0 DOS: the patient was seen and examined on 03/09/2022   Brief hospital course: Mr. Reiley was admitted to the hospital with the working diagnosis of acute pulmonary embolism.   71 yo male with the past medical history atrial fibrillation, coronary artery disease, hypertension and T2DM who presented with melena. He had a recent hospitalization 11/13 to 03/03/22 for erosive gastritis, he required 4 units PRBC transfusion and warfarin was discontinued. Patient was instructed to continue taking clopidogrel. His discharge hgb was 8.1. At home he had fatigue and dyspnea, had recurrent melena. His primary care provider recommended him to come back to the hospital. On his initial physical examination his blood pressure was 114/57, HR 81, RR 18 and 02 saturation 98%, lungs with no wheezing, rales or rhonchi, heart with S1 and S2 present and regular, abdomen with no distention or tenderness, no lower extremity edema.   Na 137, K 4,4 Cl 107 bicarbonate 22, glucose 170 bun 26 cr 1,26  Wbc 4.2 hgb 8,4 plt 177  Urina analysis with SG 1.024 with >500 glucose, negative protein   Chest radiograph with cardiomegaly with no infiltrates, pacemaker in place with one atrial and one ventricular lead.  CT chest with filling defect in the right lower lobe pulmonary artery consistent with pulmonary embolism.  Cholelithiasis.   Preliminary [place] lower extremities positive for DVT   Patient was placed on IV heparin for anticoagulation.   Assessment and Plan: * GIB (gastrointestinal bleeding) Patient with no ulcers on recent GI evaluation. His hgb has remained stable Will continue with PPI IV bid and anticoagulation with IV heparin  Close monitoring of cell count q 6 hr for 24 hrs and then daily.   Acute pulmonary embolism ([place]) Patient with acute PE and preliminary lower extremity US positive for deep vein thrombosis.   Plan to  start patient on heparin for now with close monitoring of cell count. Continue telemetry monitoring  Echocardiogram with preserved LV systolic function, mild LVH, RV systolic function is preserved. No significant valvular disease.  LV apical septal segment and apex are hypokinetic.  RVSP 27.2 trivial pericardial effusion.   Patient is in agreement with anticoagulation, for pulmonary embolism.   AKI (acute kidney injury) ([place]) Renal function with serum cr at 1,0 with K at 3,8 and serum bicarbonate at 23. Continue close monitoring renal function and electrolytes.   Atrial fibrillation Patient with paced rhythm on last EKG, he is not on AV blocking agents Will resume anticoagulation for atrial fibrillation with heparin.   Hypertension Continue blood pressure monitoring, at home patient on lisinopril and furosemide.  Systolic blood pressure 791 and 108 mmHg.  Continue to hold on antihypertensive medications.    Coronary artery disease Hold on antiplatelet therapy for now to reduce risk of bleeding  Patient with no chest pain.   Type 2 diabetes mellitus with hyperglycemia (HCC) Continue glucose cover and monitoring with insulin sliding scale. Continue statin therapy.   Chronic obstructive pulmonary disease (HCC) No clinical signs of COPD exacerbation, continue oxymetry monitoring.   History of prostate cancer Follow up as outpatient.   Depression Continue sertraline   Class 1 obesity Calculated BMI 30,3        Subjective: Patient with no chest pain or abdominal pain, today with no melena, hematochezia or hematemesis   Physical Exam: Vitals:   03/09/22 0428 03/09/22 0500 03/09/22 0805 03/09/22 1226  BP: (!) 108/59  109/67  Pulse: 81   71  Resp: 18   16  Temp: 97.9 F (36.6 C)   97.6 F (36.4 C)  TempSrc: Oral   Oral  SpO2: 99%  98% 100%  Weight:  98.6 kg    Height:       Neurology awake and alert ENT with mild pallor with no icterus Cardiovascular with S1  and S2 present and regular with no gallops, rubs or murmurs Respiratory with no wheezing or rhonchi, mild rales at bases Abdomen with no distention No lower extremity edema  Data Reviewed:    Family Communication: I spoke with patient's son at the bedside, we talked in detail about patient's condition, plan of care and prognosis and all questions were addressed.   Disposition: Status is: Observation The patient will require care spanning > 2 midnights and should be moved to inpatient because: IV heparin for anticoagulation   Planned Discharge Destination: Home    Author: Tawni Millers, MD 03/09/2022 1:06 PM  For on call review [email or web address].

## 2022-03-09 NOTE — Consult Note (Signed)
[facility] Gastroenterology Consultation Note  Referring Provider: No ref. provider found Primary Care Physician:  [name], NP  Reason for Consultation:  melena, anemia  HPI: [name] Cook is a 71 y.o. male admitted ongoing melena/anemia.  In hospital last week for same.  States never stopped having dark stools since his discharge.  He takes Plavix and had recently also taken warfarin.  Imaging shows new PE and also has DVTs.  No abdominal pain, hematemesis, change in bowel habits.  EGD last week for melena/anemia was unrevealing.  Multiple prior colonoscopies by primary gastroenterologist.   Past Medical History:  Diagnosis Date   Acute bronchitis with COPD ([place]) 03/07/2021   Arthritis    "knees and lower back" (03/14/2013)   Atrial flutter ([place])    radiofrequency ablation in 2001   CAD (coronary artery disease)    a. Nonobstructive. Cardiac cath in 2001-50% mid RI, normal LM, LAD, RCA b. cath 10/16/2014 95% mid RCA treated with DES, 99% ost D1 medical management due to small aneurysmal segment   Chronic anticoagulation    chronic Coumadin anticoagulation   Chronic obstructive pulmonary disease ([place]) 04/20/2011   Diabetes mellitus, type 2 (HCC)    Elevated lipase 06/23/2021   GERD (gastroesophageal reflux disease)    Hyperlipidemia    Hypertension    with hypertensive heart disease   Left knee pain 10/25/2017   medial   Obesity    Persistent atrial fibrillation ([place])    recurrent atrial flutter since 2001 s/p DCCVs, multiple failed AADs, h/o tachy-mediated cardiomyopathy   Shortness of breath    "can come on at any time" (03/14/2013)   Sleep apnea    "dx'd; couldn't wear the mask" (03/14/2013)   Tobacco abuse     Past Surgical History:  Procedure Laterality Date   ATRIAL FLUTTER ABLATION  2002   atrial flutter; subsequently developed atrial fibrillation   AV NODE ABLATION  01/24/2013   CARDIAC CATHETERIZATION  2002   CARDIAC CATHETERIZATION N/A 10/16/2014   Procedure: Left  Heart Cath and Coronary Angiography;  Surgeon: [name], MD;  Location: [place] [facility];  Service: Cardiovascular;  Laterality: N/A;   CARDIOVERSION  05/31/2011   Procedure: CARDIOVERSION;  Surgeon: Cristopher Estimable. Lattie Haw, MD;  Location: [facility];  Service: Cardiovascular;  Laterality: N/A;   CARPAL TUNNEL RELEASE Left 1980's   COLONOSCOPY WITH PROPOFOL N/A 12/30/2018   Procedure: COLONOSCOPY WITH PROPOFOL;  Surgeon: Jonathon Bellows, MD;  Location: [facility];  Service: Gastroenterology;  Laterality: N/A;   COLONOSCOPY WITH PROPOFOL N/A 12/04/2019   Procedure: COLONOSCOPY WITH PROPOFOL;  Surgeon: Jonathon Bellows, MD;  Location: [facility] ENDOSCOPY;  Service: Gastroenterology;  Laterality: N/A;   COLONOSCOPY WITH PROPOFOL N/A 06/13/2021   Procedure: COLONOSCOPY WITH PROPOFOL;  Surgeon: Jonathon Bellows, MD;  Location: [facility] ENDOSCOPY;  Service: Gastroenterology;  Laterality: N/A;   ESOPHAGOGASTRODUODENOSCOPY N/A 03/01/2022   Procedure: ESOPHAGOGASTRODUODENOSCOPY (EGD);  Surgeon: Clarene Essex, MD;  Location: [facility];  Service: Gastroenterology;  Laterality: N/A;   INSERT / REPLACE / REMOVE PACEMAKER  01/24/2013    Medtronic Adapta L dual-chamber pacemaker, serial number NWE [id number]    LEFT HEART CATH AND CORONARY ANGIOGRAPHY N/A 06/07/2018   Procedure: LEFT HEART CATH AND CORONARY ANGIOGRAPHY;  Surgeon: [name], MD;  Location: [place] [facility];  Service: Cardiovascular;  Laterality: N/A;   LEFT HEART CATHETERIZATION WITH CORONARY ANGIOGRAM N/A 03/17/2013   Procedure: LEFT HEART CATHETERIZATION WITH CORONARY ANGIOGRAM;  Surgeon: Burnell Blanks, MD; LAD mild dz, D1 branch 100%,  inferior branch 99%, CFX OK, RCA 50%, EF 65%     LOOP RECORDER IMPLANT  2002   PERMANENT PACEMAKER INSERTION N/A 01/24/2013   Procedure: PERMANENT PACEMAKER INSERTION;  Surgeon: [name], MD;  Location: [facility] CATH LAB;  Service: Cardiovascular;  Laterality: N/A;   TIBIAL TUBERCLERPLASTY  ~ 2003    Prior to Admission  medications   Medication Sig Start Date End Date Taking? Authorizing Provider  albuterol (VENTOLIN HFA) 108 (90 Base) MCG/ACT inhaler INHALE 2 PUFFS BY MOUTH EVERY 4 HOURS AS NEEDED FOR WHEEZE OR FOR SHORTNESS OF BREATH Patient taking differently: Inhale 2 puffs into the lungs every 4 (four) hours as needed for wheezing or shortness of breath. 01/06/22  Yes [name], NP  Ascorbic Acid (VITAMIN C) 1000 MG tablet Take 2,000 mg by mouth daily.   Yes [provider]  budesonide-formoterol (SYMBICORT) 160-4.5 MCG/ACT inhaler INHALE 2 PUFFS INTO THE LUNGS TWICE A DAY Patient taking differently: Inhale 2 puffs into the lungs 2 (two) times daily as needed ("for flares"). 01/12/21  Yes [name], NP  clopidogrel (PLAVIX) 75 MG tablet TAKE 1 TABLET BY MOUTH EVERY DAY Patient taking differently: Take 75 mg by mouth daily. 08/29/21  Yes [name], MD  fish oil-omega-3 fatty acids 1000 MG capsule Take 2 g by mouth daily.   Yes [provider]  fluticasone (FLONASE) 50 MCG/ACT nasal spray PLACE 1 SPRAY INTO BOTH NOSTRILS DAILY AS NEEDED FOR ALLERGIES OR RHINITIS. Patient taking differently: Place 1 spray into both nostrils daily as needed for allergies or rhinitis. 06/23/21  Yes [name], NP  furosemide (LASIX) 20 MG tablet TAKE 1 TABLET BY MOUTH EVERY DAY Patient taking differently: Take 20 mg by mouth in the morning. 08/29/21  Yes [name], MD  gabapentin (NEURONTIN) 300 MG capsule TAKE 1 CAPSULE BY MOUTH EVERY MORNING AND 2 CAPSULES BY MOUTH AT NIGHT FOR NEUROPATHY Patient taking differently: Take 300 mg by mouth in the morning and at bedtime. 11/18/21  Yes [name], NP  glipiZIDE (GLUCOTROL XL) 10 MG 24 hr tablet TAKE 1 TABLET (10 MG TOTAL) BY MOUTH DAILY WITH BREAKFAST. FOR DIABETES. Patient taking differently: Take 10 mg by mouth daily with breakfast. 12/05/21  Yes [name], NP  isosorbide mononitrate (IMDUR) 60 MG 24 hr tablet TAKE 1  TABLET BY MOUTH EVERY DAY Patient taking differently: Take 60 mg by mouth daily. 01/06/22  Yes [name], MD  JARDIANCE 25 MG TABS tablet TAKE 1 TABLET (25 MG TOTAL) BY MOUTH DAILY BEFORE BREAKFAST. FOR DIABETES. Patient taking differently: 25 mg daily before breakfast. 12/22/21  Yes [name], NP  lisinopril (ZESTRIL) 2.5 MG tablet TAKE 1 TABLET BY MOUTH EVERY DAY Patient taking differently: Take 2.5 mg by mouth in the morning. 07/11/21  Yes [name], MD  nitroGLYCERIN (NITROSTAT) 0.4 MG SL tablet Place 1 tablet (0.4 mg total) under the tongue every 5 (five) minutes as needed for chest pain. 04/08/21  Yes [name], NP  pantoprazole (PROTONIX) 40 MG tablet Take 1 tablet (40 mg total) by mouth 2 (two) times daily. 03/03/22 06/01/22 Yes Ghimire, Dante Gang, MD  rosuvastatin (CRESTOR) 20 MG tablet TAKE 1 TABLET BY MOUTH EVERY DAY IN THE EVENING FOR CHOLESTEROL Patient taking differently: Take 20 mg by mouth at bedtime. 11/24/21  Yes [name], NP  sertraline (ZOLOFT) 50 MG tablet TAKE 1 TABLET (50 MG TOTAL) BY MOUTH DAILY. FOR ANXIETY AND  DEPRESSION. Patient taking differently: Take 50 mg by mouth daily. 01/06/22  Yes [name], NP  tamsulosin (FLOMAX) 0.4 MG CAPS capsule TAKE 1 CAPSULE (0.4 MG TOTAL) BY MOUTH 2 (TWO) TIMES DAILY. Patient taking differently: Take 0.4 mg by mouth at bedtime. 01/12/21  Yes Chrystal, Eulas Post, MD  TYLENOL 500 MG tablet Take 500-1,000 mg by mouth every 6 (six) hours as needed for mild pain or headache.   Yes [provider]  blood glucose meter kit and supplies KIT Dispense based on patient and insurance preference. Use up to four times daily as directed. (FOR ICD-9 250.00, 250.01). 04/08/21   [name], NP  glucose blood test strip 1 each by Other route as needed for other. Use to check blood sugar up to three times a day-Accu-Chek meter    [provider]    Current Facility-Administered Medications   Medication Dose Route Frequency Provider Last Rate Last Admin   acetaminophen (TYLENOL) tablet 650 mg  650 mg Oral Q6H PRN Marylyn Ishihara, Tyrone A, DO       Or   acetaminophen (TYLENOL) suppository 650 mg  650 mg Rectal Q6H PRN Marylyn Ishihara, Tyrone A, DO       gabapentin (NEURONTIN) capsule 300 mg  300 mg Oral BID Arrien, Jimmy Picket, MD   300 mg at 03/09/22 1330   insulin aspart (novoLOG) injection 0-15 Units  0-15 Units Subcutaneous TID WC Kyle, Tyrone A, DO   2 Units at 03/09/22 1329   insulin aspart (novoLOG) injection 0-5 Units  0-5 Units Subcutaneous QHS Kyle, Tyrone A, DO       mometasone-formoterol (DULERA) 200-5 MCG/ACT inhaler 2 puff  2 puff Inhalation BID Marylyn Ishihara, Tyrone A, DO   2 puff at 03/09/22 0804   pantoprazole (PROTONIX) injection 40 mg  40 mg Intravenous Q12H Arrien, Jimmy Picket, MD   40 mg at 03/09/22 1330   rosuvastatin (CRESTOR) tablet 20 mg  20 mg Oral QHS Kyle, Tyrone A, DO   20 mg at 03/08/22 2100   sertraline (ZOLOFT) tablet 50 mg  50 mg Oral Daily Arrien, Jimmy Picket, MD   50 mg at 03/09/22 1329   tamsulosin (FLOMAX) capsule 0.4 mg  0.4 mg Oral BID Cherylann Ratel A, DO   0.4 mg at 03/09/22 1941    Allergies as of 03/08/2022 - Review Complete 03/08/2022  Allergen Reaction Noted   Januvia [sitagliptin] Other (See Comments) 06/23/2021   Metformin and related Diarrhea 06/23/2021    Family History  Problem Relation Age of Onset   Alzheimer's disease Mother    Osteoporosis Mother     Social History   Socioeconomic History   Marital status: Widowed    Spouse name: Not on file   Number of children: 1   Years of education: Not on file   Highest education level: Not on file  Occupational History   Occupation: Unemployed    Employer: UNEMPLOYED  Tobacco Use   Smoking status: Former    Packs/day: 1.00    Years: 42.00    Total pack years: 42.00    Types: Cigarettes    Quit date: 12/30/2013    Years since quitting: 8.1   Smokeless tobacco: Never  Vaping Use   Vaping  Use: Never used  Substance and Sexual Activity   Alcohol use: Not Currently    Comment: 03/14/2013 "stopped drinking back in 2002; never had problem w/it"   Drug use: No   Sexual activity: Not Currently  Other Topics Concern  Not on file  Social History Narrative   Single.   Retired.    1 son, deceased.    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   Enjoys playing on the computer.       Social Determinants of Health   Financial Resource Strain: Low Risk  (10/07/2021)   Overall Financial Resource Strain (CARDIA)    Difficulty of Paying Living Expenses: Not hard at all  Food Insecurity: No Food Insecurity (03/09/2022)   Hunger Vital Sign    Worried About Running Out of Food in the Last Year: Never true    Ran Out of Food in the Last Year: Never true  Transportation Needs: No Transportation Needs (03/09/2022)   PRAPARE - Hydrologist (Medical): No    Lack of Transportation (Non-Medical): No  Physical Activity: Unknown (10/07/2021)   Exercise Vital Sign    Days of Exercise per Week: 3 days    Minutes of Exercise per Session: Not on file  Stress: No Stress Concern Present (10/07/2021)   [place]    Feeling of Stress : Not at all  Social Connections: Socially Isolated (10/07/2021)   Social Connection and Isolation Panel [NHANES]    Frequency of Communication with Friends and Family: Once a week    Frequency of Social Gatherings with Friends and Family: Once a week    Attends Religious Services: Never    Marine scientist or Organizations: No    Attends Archivist Meetings: Never    Marital Status: Widowed  Intimate Partner Violence: Not At Risk (03/09/2022)   Humiliation, Afraid, Rape, and Kick questionnaire    Fear of Current or Ex-Partner: No    Emotionally Abused: No    Physically Abused: No    Sexually Abused: No    Review  of Systems: As per HPI, all others negative  Physical Exam: Vital signs in last 24 hours: Temp:  [97.6 F (36.4 C)-98.1 F (36.7 C)] 97.6 F (36.4 C) (11/23 1226) Pulse Rate:  [69-84] 71 (11/23 1226) Resp:  [16-21] 16 (11/23 1226) BP: (99-117)/(57-71) 109/67 (11/23 1226) SpO2:  [96 %-100 %] 100 % (11/23 1226) Weight:  [98.6 kg] 98.6 kg (11/23 0500) Last BM Date : 03/08/22 General:   Alert,  Well-developed, well-nourished, pleasant and cooperative in NAD Head:  Normocephalic and atraumatic. Eyes:  Sclera clear, no icterus.   Conjunctiva pale. Ears:  Normal auditory acuity. Nose:  No deformity, discharge,  or lesions. Mouth:  No deformity or lesions.  Oropharynx pale & moist. Neck:  Supple; no masses or thyromegaly. Lungs:  No respiratory distress Abdomen:  Soft, protuberant, nontender and nondistended. No masses, hepatosplenomegaly or hernias noted. Normal bowel sounds, without guarding, and without rebound.     Msk:  Symmetrical without gross deformities. Normal posture. Pulses:  Normal pulses noted. Extremities:  Without clubbing or edema. Neurologic:  Alert and  oriented x4;  grossly normal neurologically. Skin:  Scattered ecchymoses, otherwise intact without significant lesions or rashes. Psych:  Alert and cooperative. Normal mood and affect.   Lab Results: Recent Labs    03/08/22 1233 03/08/22 1240 03/08/22 1800 03/09/22 0103  WBC 4.2  --   --  3.3*  HGB 8.4* 8.8* 7.7* 8.2*  HCT 26.6* 26.0* 24.2* 26.0*  PLT 177  --   --  153   BMET Recent Labs    03/08/22 1233 03/08/22 1240 03/09/22 0103  NA 137 137 141  K 4.4 4.4 3.8  CL 107 105 111  CO2 22  --  23  GLUCOSE 170* 162* 96  BUN 26* 23 19  CREATININE 1.26* 1.40* 1.02  CALCIUM 9.2  --  8.6*   LFT Recent Labs    03/09/22 0103  PROT 5.4*  ALBUMIN 3.3*  AST 13*  ALT 16  ALKPHOS 40  BILITOT 0.6   PT/INR Recent Labs    03/08/22 1233  LABPROT 13.6  INR 1.1    Studies/Results: VAS [place] LOWER  EXTREMITY VENOUS (DVT)  Result Date: 03/09/2022  Lower Venous DVT Study Patient Name:  [name] Cook  Date of Exam:   03/09/2022 Medical Rec #: [id number]    Accession #:    [id number] Date of Birth: 01-15-1951    Patient Gender: M Patient Age:   47 years Exam Location:  [facility] Procedure:      VAS [place] LOWER EXTREMITY VENOUS (DVT) Referring Phys: Cherylann Ratel --------------------------------------------------------------------------------  Indications: Pulmonary embolism.  Risk Factors: Confirmed PE. Limitations: Poor ultrasound/tissue interface. Comparison Study: No prior studies. Performing Technologist: Oliver Hum RVT  Examination Guidelines: A complete evaluation includes B-mode imaging, spectral Doppler, color Doppler, and power Doppler as needed of all accessible portions of each vessel. Bilateral testing is considered an integral part of a complete examination. Limited examinations for reoccurring indications may be performed as noted. The reflux portion of the exam is performed with the patient in reverse Trendelenburg.  +---------+---------------+---------+-----------+----------+--------------+ RIGHT    CompressibilityPhasicitySpontaneityPropertiesThrombus Aging +---------+---------------+---------+-----------+----------+--------------+ CFV      Full           Yes      Yes                                 +---------+---------------+---------+-----------+----------+--------------+ SFJ      Full                                                        +---------+---------------+---------+-----------+----------+--------------+ FV Prox  Full                                                        +---------+---------------+---------+-----------+----------+--------------+ FV Mid   Full                                                        +---------+---------------+---------+-----------+----------+--------------+ FV DistalFull                                                         +---------+---------------+---------+-----------+----------+--------------+ PFV      Full                                                        +---------+---------------+---------+-----------+----------+--------------+  POP      Full           Yes      Yes                                 +---------+---------------+---------+-----------+----------+--------------+ PTV      Full                                                        +---------+---------------+---------+-----------+----------+--------------+ PERO     Full                                                        +---------+---------------+---------+-----------+----------+--------------+   +---------+---------------+---------+-----------+----------+--------------+ LEFT     CompressibilityPhasicitySpontaneityPropertiesThrombus Aging +---------+---------------+---------+-----------+----------+--------------+ CFV      Full           Yes      Yes                                 +---------+---------------+---------+-----------+----------+--------------+ SFJ      Full                                                        +---------+---------------+---------+-----------+----------+--------------+ FV Prox  Full                                                        +---------+---------------+---------+-----------+----------+--------------+ FV Mid   Full                                                        +---------+---------------+---------+-----------+----------+--------------+ FV DistalFull                                                        +---------+---------------+---------+-----------+----------+--------------+ PFV      Full                                                        +---------+---------------+---------+-----------+----------+--------------+ POP      Full           Yes      Yes                                  +---------+---------------+---------+-----------+----------+--------------+  PTV      Partial                                      Acute          +---------+---------------+---------+-----------+----------+--------------+ PERO     Full                                                        +---------+---------------+---------+-----------+----------+--------------+ SSV      None                                         Acute          +---------+---------------+---------+-----------+----------+--------------+    Summary: RIGHT: - There is no evidence of deep vein thrombosis in the lower extremity.  - No cystic structure found in the popliteal fossa.  LEFT: - Findings consistent with acute deep vein thrombosis involving the left posterior tibial veins. - Findings consistent with acute superficial vein thrombosis involving the left small saphenous vein. - Findings suggest new clot progression as compared to previous examination. - No cystic structure found in the popliteal fossa.  *See table(s) above for measurements and observations.    Preliminary    ECHOCARDIOGRAM COMPLETE  Result Date: 03/09/2022    ECHOCARDIOGRAM REPORT   Patient Name:   [name] Cook Date of Exam: 03/09/2022 Medical Rec #:  [id number]   Height:       71.0 in Accession #:    [id number]  Weight:       217.4 lb Date of Birth:  1950-11-17   BSA:          2.185 m Patient Age:    89 years    BP:           108/59 mmHg Patient Gender: M           HR:           71 bpm. Exam Location:  Inpatient Procedure: 2D Echo Indications:    pulmonary embolus  History:        Patient has prior history of Echocardiogram examinations, most                 recent 11/26/2020. CAD, Pacemaker, COPD, Arrythmias:Atrial                 Fibrillation; Risk Factors:Hypertension, Dyslipidemia, Diabetes                 and Former Smoker.  Sonographer:    [place] Referring Phys: [id number] [place]  1. Left ventricular ejection  fraction, by estimation, is 50%. The left ventricle has low normal function. Left ventricular endocardial border not optimally defined to evaluate regional wall motion. There is mild concentric left ventricular hypertrophy. Left ventricular diastolic parameters are indeterminate.  2. Right ventricular systolic function is normal. The right ventricular size is normal. There is normal pulmonary artery systolic pressure.  3. Left atrial size was mildly dilated.  4. The mitral valve is grossly normal. No evidence of mitral valve regurgitation. No evidence of mitral stenosis.  5. The aortic valve is tricuspid. Aortic valve regurgitation  is not visualized. Aortic valve sclerosis is present, with no evidence of aortic valve stenosis.  6. The inferior vena cava is dilated in size with >50% respiratory variability, suggesting right atrial pressure of 8 mmHg. Comparison(s): Apex is not as well defined in this study. FINDINGS  Left Ventricle: Left ventricular ejection fraction, by estimation, is 50%. The left ventricle has mildly decreased function. Left ventricular endocardial border not optimally defined to evaluate regional wall motion. The left ventricular internal cavity  size was normal in size. There is mild concentric left ventricular hypertrophy. Left ventricular diastolic parameters are indeterminate.  LV Wall Scoring: The apical septal segment and apex are hypokinetic. Right Ventricle: The right ventricular size is normal. No increase in right ventricular wall thickness. Right ventricular systolic function is normal. There is normal pulmonary artery systolic pressure. The tricuspid regurgitant velocity is 2.19 m/s, and  with an assumed right atrial pressure of 8 mmHg, the estimated right ventricular systolic pressure is 91.6 mmHg. Left Atrium: Left atrial size was mildly dilated. Right Atrium: Right atrial size was normal in size. Pericardium: Trivial pericardial effusion is present. Presence of epicardial fat  layer. Mitral Valve: The mitral valve is grossly normal. No evidence of mitral valve regurgitation. No evidence of mitral valve stenosis. Tricuspid Valve: The tricuspid valve is normal in structure. Tricuspid valve regurgitation is trivial. No evidence of tricuspid stenosis. Aortic Valve: The aortic valve is tricuspid. Aortic valve regurgitation is not visualized. Aortic valve sclerosis is present, with no evidence of aortic valve stenosis. Pulmonic Valve: The pulmonic valve was normal in structure. Pulmonic valve regurgitation is not visualized. No evidence of pulmonic stenosis. Aorta: The aortic root and ascending aorta are structurally normal, with no evidence of dilitation. Venous: The inferior vena cava is dilated in size with greater than 50% respiratory variability, suggesting right atrial pressure of 8 mmHg. IAS/Shunts: No atrial level shunt detected by color flow Doppler. Additional Comments: A device lead is visualized in the right ventricle and right atrium.  LEFT VENTRICLE PLAX 2D LVIDd:         4.90 cm   Diastology LVIDs:         3.10 cm   LV e' medial:  5.87 cm/s LV PW:         1.30 cm   LV e' lateral: 4.68 cm/s LV IVS:        1.20 cm LVOT diam:     2.40 cm LV SV:         56 LV SV Index:   26 LVOT Area:     4.52 cm  RIGHT VENTRICLE             IVC RV S prime:     12.10 cm/s  IVC diam: 2.40 cm TAPSE (M-mode): 1.6 cm LEFT ATRIUM             Index        RIGHT ATRIUM           Index LA diam:        3.30 cm 1.51 cm/m   RA Area:     18.30 cm LA Vol (A2C):   84.1 ml 38.50 ml/m  RA Volume:   48.00 ml  21.97 ml/m LA Vol (A4C):   79.9 ml 36.58 ml/m LA Biplane Vol: 81.8 ml 37.44 ml/m  AORTIC VALVE LVOT Vmax:   69.70 cm/s LVOT Vmean:  46.000 cm/s LVOT VTI:    0.124 m  AORTA Ao Root diam: 3.80 cm Ao Asc  diam:  3.40 cm TRICUSPID VALVE TR Peak grad:   19.2 mmHg TR Vmax:        219.00 cm/s  SHUNTS Systemic VTI:  0.12 m Systemic Diam: 2.40 cm Rudean Haskell MD Electronically signed by Rudean Haskell  MD Signature Date/Time: 03/09/2022/9:13:34 AM    Final    US RENAL  Result Date: 03/08/2022 CLINICAL DATA:  Acute kidney injury EXAM: RENAL / URINARY TRACT ULTRASOUND COMPLETE COMPARISON:  02/27/2022 CT FINDINGS: Right Kidney: Renal measurements: 10.2 x 5.5 x 7.1 cm = volume: 209 mL. Echogenicity within normal limits. No mass or hydronephrosis visualized. Left Kidney: Renal measurements: 10.0 x 5.8 x 6.1 cm = volume: 185 mL. Echogenicity within normal limits. No mass or hydronephrosis visualized. Bladder: Appears normal for degree of bladder distention. Other: None. IMPRESSION: Normal study. Electronically Signed   By: Rolm Baptise M.D.   On: 03/08/2022 20:56   CT Angio Chest PE W and/or Wo Contrast  Result Date: 03/08/2022 CLINICAL DATA:  Black tarry stools, weakness, dizziness, shortness of breath, upper abdominal pain, clinical suspicion of pulmonary embolism, past history of coronary artery disease, type II diabetes mellitus, GERD, atrial fibrillation, former smoker EXAM: CT ANGIOGRAPHY CHEST WITH CONTRAST TECHNIQUE: Multidetector CT imaging of the chest was performed using the standard protocol during bolus administration of intravenous contrast. Multiplanar CT image reconstructions and MIPs were obtained to evaluate the vascular anatomy. RADIATION DOSE REDUCTION: This exam was performed according to the departmental dose-optimization program which includes automated exposure control, adjustment of the mA and/or kV according to patient size and/or use of iterative reconstruction technique. CONTRAST:  48m OMNIPAQUE IOHEXOL 350 MG/ML SOLN IV COMPARISON:  05/06/2021 FINDINGS: Cardiovascular: Atherosclerotic calcifications aorta, proximal great vessels, and coronary arteries. Aorta normal caliber without aneurysm or dissection. Heart size normal. No pericardial effusion. Pacing leads RIGHT atrium and RIGHT ventricle. Pulmonary arteries well opacified. Filling defect identified in RIGHT lower lobe  pulmonary artery consistent with pulmonary embolism. No additional pulmonary emboli identified. RV/LV ratio: 0.91, non elevated Mediastinum/Nodes: Esophagus unremarkable. No adenopathy. Base of cervical region normal appearance. Lungs/Pleura: Calcified granuloma superior segment RIGHT lower lobe image 74. Lungs otherwise clear. No pulmonary infiltrate, pleural effusion, or pneumothorax. Upper Abdomen: Dependent tiny calculi in gallbladder. Visualized upper abdomen otherwise unremarkable. Musculoskeletal: No acute osseous findings. Review of the MIP images confirms the above findings. IMPRESSION: Filling defect in RIGHT lower lobe pulmonary artery consistent with pulmonary embolism. Cholelithiasis. Scattered atherosclerotic calcifications including coronary arteries. Aortic Atherosclerosis (ICD10-I70.0). Critical Value/emergent results were called by telephone at the time of interpretation on 03/08/2022 at 1447 hrs [facility] who verbally acknowledged these results. Electronically Signed   By: MLavonia DanaM.D.   On: 03/08/2022 14:47   DG Chest Port 1 View  Result Date: 03/08/2022 CLINICAL DATA:  Questionable sepsis - evaluate for abnormality. 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 The cardiomediastinal silhouette is unchanged with normal heart size. Aortic atherosclerosis is noted. No airspace consolidation, edema, sizable pleural effusion, or pneumothorax is identified no acute osseous abnormality is seen. IMPRESSION: No active disease. Electronically Signed   By: ALogan BoresM.D.   On: 03/08/2022 12:55    Impression:   Ongoing dark stools. Anemia, Hgb now about same as prior hospital discharge. Multiple endoscopic and colonoscopic procedures, including EGD one week ago for melena/anemia which patient reports hasn't remitted, and procedure of which was unrevealing. DVT + PE, anticoagulation.  Plan:  Clear  liquid diet ok. NPO after midnight. Capsule endoscopy planned for tomorrow. PPI. Serial CBCs, transfuse as needed. [facility] will follow.   LOS: 0 days   Alma Mohiuddin M  03/09/2022, 1:44 PM  Cell [phone] If no answer or after 5 PM call [phone]

## 2022-03-09 NOTE — Assessment & Plan Note (Addendum)
Patient with acute PE and acute left lower extremity deep vein thrombosis involving the posterior tibial veins, superficial vein thrombosis left small saphenous vein.   Echocardiogram with preserved LV systolic function, mild LVH, RV systolic function is preserved. No significant valvular disease.  LV apical septal segment and apex are hypokinetic.  RVSP 27.2 trivial pericardial effusion.   Plan to continue anticoagulation with IV heparin, will transition to oral anticoagulation if patient has no active bleeding, follow up with capsule endoscopy and GI recommendations.

## 2022-03-09 NOTE — Assessment & Plan Note (Signed)
Hold on antiplatelet therapy for now to reduce risk of bleeding  Patient with no chest pain.

## 2022-03-09 NOTE — Assessment & Plan Note (Signed)
Continue glucose cover and monitoring with insulin sliding scale. Continue statin therapy.

## 2022-03-09 NOTE — Assessment & Plan Note (Addendum)
Patient with paced rhythm on last EKG, he is not on AV blocking agents Continue anticoagulation with IV heparin, eventually transition to oral anticoagulation if no bleeding complications.

## 2022-03-09 NOTE — Progress Notes (Signed)
ANTICOAGULATION CONSULT NOTE - Initial Consult  Pharmacy Consult for Heparin Indication: atrial fibrillation, pulmonary embolus, and DVT  Allergies  Allergen Reactions   Januvia [Sitagliptin] Other (See Comments)    Elevated lipase   Metformin And Related Diarrhea    Patient Measurements: Height: _0  (180.3 cm) Weight: 98.6 kg (217 lb 6 oz) IBW/kg (Calculated) : 75.3 Heparin Dosing Weight: 95 kg  Vital Signs: Temp: 97.6 F (36.4 C) (11/23 1226) Temp Source: Oral (11/23 1226) BP: 109/67 (11/23 1226) Pulse Rate: 71 (11/23 1226)  Labs: Recent Labs    03/08/22 1233 03/08/22 1240 03/08/22 1426 03/08/22 1800 03/09/22 0103  HGB 8.4* 8.8*  --  7.7* 8.2*  HCT 26.6* 26.0*  --  24.2* 26.0*  PLT 177  --   --   --  153  LABPROT 13.6  --   --   --   --   INR 1.1  --   --   --   --   CREATININE 1.26* 1.40*  --   --  1.02  TROPONINIHS 44*  --  35*  --   --     Estimated Creatinine Clearance: 81.8 mL/min (by C-G formula based on SCr of 1.02 mg/dL).   Medical History: Past Medical History:  Diagnosis Date   Acute bronchitis with COPD ([place]) 03/07/2021   Arthritis    "knees and lower back" (03/14/2013)   Atrial flutter ([place])    radiofrequency ablation in 2001   CAD (coronary artery disease)    a. Nonobstructive. Cardiac cath in 2001-50% mid RI, normal LM, LAD, RCA b. cath 10/16/2014 95% mid RCA treated with DES, 99% ost D1 medical management due to small aneurysmal segment   Chronic anticoagulation    chronic Coumadin anticoagulation   Chronic obstructive pulmonary disease ([place]) 04/20/2011   Diabetes mellitus, type 2 (HCC)    Elevated lipase 06/23/2021   GERD (gastroesophageal reflux disease)    Hyperlipidemia    Hypertension    with hypertensive heart disease   Left knee pain 10/25/2017   medial   Obesity    Persistent atrial fibrillation ([place])    recurrent atrial flutter since 2001 s/p DCCVs, multiple failed AADs, h/o tachy-mediated cardiomyopathy   Shortness of breath     "can come on at any time" (03/14/2013)   Sleep apnea    "dx'd; couldn't wear the mask" (03/14/2013)   Tobacco abuse     Medications:  Medications Prior to Admission  Medication Sig Dispense Refill Last Dose   albuterol (VENTOLIN HFA) 108 (90 Base) MCG/ACT inhaler INHALE 2 PUFFS BY MOUTH EVERY 4 HOURS AS NEEDED FOR WHEEZE OR FOR SHORTNESS OF BREATH (Patient taking differently: Inhale 2 puffs into the lungs every 4 (four) hours as needed for wheezing or shortness of breath.) 8.5 each 0 Past Month   Ascorbic Acid (VITAMIN C) 1000 MG tablet Take 2,000 mg by mouth daily.   03/08/2022   budesonide-formoterol (SYMBICORT) 160-4.5 MCG/ACT inhaler INHALE 2 PUFFS INTO THE LUNGS TWICE A DAY (Patient taking differently: Inhale 2 puffs into the lungs 2 (two) times daily as needed ("for flares").) 10.2 each 5 Past Month   clopidogrel (PLAVIX) 75 MG tablet TAKE 1 TABLET BY MOUTH EVERY DAY (Patient taking differently: Take 75 mg by mouth daily.) 90 tablet 2 03/08/2022   fish oil-omega-3 fatty acids 1000 MG capsule Take 2 g by mouth daily.   03/08/2022   fluticasone (FLONASE) 50 MCG/ACT nasal spray PLACE 1 SPRAY INTO BOTH NOSTRILS DAILY AS NEEDED FOR  ALLERGIES OR RHINITIS. (Patient taking differently: Place 1 spray into both nostrils daily as needed for allergies or rhinitis.) 48 mL 3 Past Week   furosemide (LASIX) 20 MG tablet TAKE 1 TABLET BY MOUTH EVERY DAY (Patient taking differently: Take 20 mg by mouth in the morning.) 90 tablet 2 03/08/2022   gabapentin (NEURONTIN) 300 MG capsule TAKE 1 CAPSULE BY MOUTH EVERY MORNING AND 2 CAPSULES BY MOUTH AT NIGHT FOR NEUROPATHY (Patient taking differently: Take 300 mg by mouth in the morning and at bedtime.) 270 capsule 3 03/08/2022   glipiZIDE (GLUCOTROL XL) 10 MG 24 hr tablet TAKE 1 TABLET (10 MG TOTAL) BY MOUTH DAILY WITH BREAKFAST. FOR DIABETES. (Patient taking differently: Take 10 mg by mouth daily with breakfast.) 90 tablet 1 03/08/2022   isosorbide mononitrate  (IMDUR) 60 MG 24 hr tablet TAKE 1 TABLET BY MOUTH EVERY DAY (Patient taking differently: Take 60 mg by mouth daily.) 90 tablet 1 03/08/2022   JARDIANCE 25 MG TABS tablet TAKE 1 TABLET (25 MG TOTAL) BY MOUTH DAILY BEFORE BREAKFAST. FOR DIABETES. (Patient taking differently: 25 mg daily before breakfast.) 90 tablet 1 03/08/2022   lisinopril (ZESTRIL) 2.5 MG tablet TAKE 1 TABLET BY MOUTH EVERY DAY (Patient taking differently: Take 2.5 mg by mouth in the morning.) 90 tablet 3 03/08/2022   nitroGLYCERIN (NITROSTAT) 0.4 MG SL tablet Place 1 tablet (0.4 mg total) under the tongue every 5 (five) minutes as needed for chest pain. 25 tablet 0 unknown   pantoprazole (PROTONIX) 40 MG tablet Take 1 tablet (40 mg total) by mouth 2 (two) times daily. 60 tablet 2 03/08/2022   rosuvastatin (CRESTOR) 20 MG tablet TAKE 1 TABLET BY MOUTH EVERY DAY IN THE EVENING FOR CHOLESTEROL (Patient taking differently: Take 20 mg by mouth at bedtime.) 90 tablet 3 03/07/2022   sertraline (ZOLOFT) 50 MG tablet TAKE 1 TABLET (50 MG TOTAL) BY MOUTH DAILY. FOR ANXIETY AND DEPRESSION. (Patient taking differently: Take 50 mg by mouth daily.) 90 tablet 2 03/08/2022   tamsulosin (FLOMAX) 0.4 MG CAPS capsule TAKE 1 CAPSULE (0.4 MG TOTAL) BY MOUTH 2 (TWO) TIMES DAILY. (Patient taking differently: Take 0.4 mg by mouth at bedtime.) 180 capsule 3 03/07/2022   TYLENOL 500 MG tablet Take 500-1,000 mg by mouth every 6 (six) hours as needed for mild pain or headache.   Past Month   blood glucose meter kit and supplies KIT Dispense based on patient and insurance preference. Use up to four times daily as directed. (FOR ICD-9 250.00, 250.01). 1 each 0    glucose blood test strip 1 each by Other route as needed for other. Use to check blood sugar up to three times a day-Accu-Chek meter      Infusions:   Assessment: 81 yoM admitted on 11/22 for new/worsening melena, ShOB and found to have PE.  Initially he was not started on anticoagulation due to  bleeding.  PMH is significant for afib previously on warfarin anticoagulation that was stopped during recent admission (11/13-11/17) for melena, erosive gastritis requiring Vitamin K reversal and PRBC transfusion (11/13, 11/14, 11/16) 11/22 CT:  RLL PE.  Baseline labs: Hgb 7.7, INR 1.1 11/23 CBC: Hgb improved to 8.2, Dopplers + LLE DVT.  Pharmacy is consulted to dose Heparin IV.  No further bleeding reported today.    Goal of Therapy:  Heparin level 0.3-0.7 units/ml Monitor platelets by anticoagulation protocol: Yes   Plan:  Per Dr. Cathlean Sauer, [place] to give initial bolus and keep normal goal range. Give heparin  2000 units bolus IV x 1  ([place] nomogram dosing, rounded down due to anemia.)  Start heparin IV infusion at 1550 units/hr Heparin level 6 hours after starting Daily heparin level and CBC  Gretta Arab PharmD, BCPS [facility] main pharmacy [phone] 03/09/2022 1:39 PM

## 2022-03-09 NOTE — Assessment & Plan Note (Signed)
No clinical signs of COPD exacerbation, continue oxymetry monitoring.

## 2022-03-09 NOTE — Assessment & Plan Note (Addendum)
Renal function continue to be stable with serum cr at 1,0, K is 4,2 and serum bicarbonate at 25. Continue close follow up of renal function and electrolytes. Hold on IV fluids

## 2022-03-09 NOTE — Hospital Course (Addendum)
[name] Cook was admitted to the hospital with the working diagnosis of acute pulmonary embolism.   71 yo male with the past medical history atrial fibrillation, coronary artery disease, hypertension and T2DM who presented with melena. He had a recent hospitalization 11/13 to 03/03/22 for erosive gastritis, he required 4 units PRBC transfusion and warfarin was discontinued. Patient was instructed to continue taking clopidogrel. His discharge hgb was 8.1. At home he had fatigue and dyspnea, had recurrent melena. His primary care provider recommended him to come back to the hospital. On his initial physical examination his blood pressure was 114/57, HR 81, RR 18 and 02 saturation 98%, lungs with no wheezing, rales or rhonchi, heart with S1 and S2 present and regular, abdomen with no distention or tenderness, no lower extremity edema.   Na 137, K 4,4 Cl 107 bicarbonate 22, glucose 170 bun 26 cr 1,26  Wbc 4.2 hgb 8,4 plt 177  Urina analysis with SG 1.024 with >500 glucose, negative protein   Chest radiograph with cardiomegaly with no infiltrates, pacemaker in place with one atrial and one ventricular lead.  CT chest with filling defect in the right lower lobe pulmonary artery consistent with pulmonary embolism.  Cholelithiasis.   Preliminary [place] lower extremities positive for DVT   Patient was placed on IV heparin for anticoagulation.   11/24 hgb has been stable and tolerating well IV heparin. Capsule endoscopy per GI.

## 2022-03-10 ENCOUNTER — Encounter (HOSPITAL_COMMUNITY)
Admission: EM | Disposition: A | Discharge status: Home / Self Care | Payer: Self-pay | Source: Home / Self Care | Attending: Internal Medicine

## 2022-03-10 DIAGNOSIS — E1165 Type 2 diabetes mellitus with hyperglycemia: Secondary | ICD-10-CM

## 2022-03-10 DIAGNOSIS — I10 Essential (primary) hypertension: Secondary | ICD-10-CM | POA: Diagnosis not present

## 2022-03-10 DIAGNOSIS — I2699 Other pulmonary embolism without acute cor pulmonale: Secondary | ICD-10-CM | POA: Diagnosis not present

## 2022-03-10 DIAGNOSIS — F3341 Major depressive disorder, recurrent, in partial remission: Secondary | ICD-10-CM | POA: Diagnosis not present

## 2022-03-10 HISTORY — PX: GIVENS CAPSULE STUDY: SHX5432

## 2022-03-10 LAB — HEMOGLOBIN AND HEMATOCRIT, BLOOD
HCT: 28.7 % — ABNORMAL LOW (ref 39.0–52.0)
HCT: 29.3 % — ABNORMAL LOW (ref 39.0–52.0)
HCT: 29.1 % — ABNORMAL LOW (ref 39.0–52.0)
Hemoglobin: 9.1 g/dL — ABNORMAL LOW (ref 13.0–17.0)
Hemoglobin: 9 g/dL — ABNORMAL LOW (ref 13.0–17.0)
Hemoglobin: 9.1 g/dL — ABNORMAL LOW (ref 13.0–17.0)

## 2022-03-10 LAB — GLUCOSE, CAPILLARY
Glucose-Capillary: 138 mg/dL — ABNORMAL HIGH (ref 70–99)
Glucose-Capillary: 119 mg/dL — ABNORMAL HIGH (ref 70–99)
Glucose-Capillary: 189 mg/dL — ABNORMAL HIGH (ref 70–99)
Glucose-Capillary: 209 mg/dL — ABNORMAL HIGH (ref 70–99)

## 2022-03-10 LAB — BASIC METABOLIC PANEL
Anion gap: 5 (ref 5–15)
BUN: 12 mg/dL (ref 8–23)
CO2: 25 mmol/L (ref 22–32)
Calcium: 8.7 mg/dL — ABNORMAL LOW (ref 8.9–10.3)
Chloride: 111 mmol/L (ref 98–111)
Creatinine, Ser: 1.05 mg/dL (ref 0.61–1.24)
GFR, Estimated: >60 mL/min (ref >60)
Glucose, Bld: 112 mg/dL — ABNORMAL HIGH (ref 70–99)
Potassium: 4.2 mmol/L (ref 3.5–5.1)
Sodium: 141 mmol/L (ref 135–145)

## 2022-03-10 LAB — CBC
HCT: 28.1 % — ABNORMAL LOW (ref 39.0–52.0)
Hemoglobin: 8.8 g/dL — ABNORMAL LOW (ref 13.0–17.0)
MCH: 30.3 pg (ref 26.0–34.0)
MCHC: 31.3 g/dL (ref 30.0–36.0)
MCV: 96.9 fL (ref 80.0–100.0)
Platelets: 179 10*3/uL (ref 150–400)
RBC: 2.9 MIL/uL — ABNORMAL LOW (ref 4.22–5.81)
RDW: 15.3 % (ref 11.5–15.5)
WBC: 4.3 10*3/uL (ref 4.0–10.5)
nRBC: 0 % (ref 0.0–0.2)

## 2022-03-10 LAB — HEPARIN LEVEL (UNFRACTIONATED)
Heparin Unfractionated: >1.1 IU/mL — ABNORMAL HIGH (ref 0.30–0.70)
Heparin Unfractionated: 1.01 IU/mL — ABNORMAL HIGH (ref 0.30–0.70)

## 2022-03-10 LAB — HEMOGLOBIN A1C
Hgb A1c MFr Bld: 6.4 % — ABNORMAL HIGH (ref 4.8–5.6)
Mean Plasma Glucose: 137 mg/dL

## 2022-03-10 SURGERY — IMAGING PROCEDURE, GI TRACT, INTRALUMINAL, VIA CAPSULE
Laterality: Left

## 2022-03-10 MED ORDER — HEPARIN (PORCINE) 25000 UT/250ML-% IV SOLN
1150.0000 [IU]/h | INTRAVENOUS | Status: DC
  Administered 2022-03-10: 1150 [IU]/h via INTRAVENOUS

## 2022-03-10 MED ORDER — HEPARIN (PORCINE) 25000 UT/250ML-% IV SOLN
850.0000 [IU]/h | INTRAVENOUS | Status: DC
  Administered 2022-03-11: 850 [IU]/h via INTRAVENOUS
  Filled 2022-03-10: qty 250

## 2022-03-10 NOTE — Progress Notes (Signed)
Progress Note   Patient: [name] Cook DOB: 03/15/1951 DOA: 03/08/2022     1 DOS: the patient was seen and examined on 03/10/2022   Brief hospital course: [name] Cook was admitted to the hospital with the working diagnosis of acute pulmonary embolism.   71 yo male with the past medical history atrial fibrillation, coronary artery disease, hypertension and T2DM who presented with melena. He had a recent hospitalization 11/13 to 03/03/22 for erosive gastritis, he required 4 units PRBC transfusion and warfarin was discontinued. Patient was instructed to continue taking clopidogrel. His discharge hgb was 8.1. At home he had fatigue and dyspnea, had recurrent melena. His primary care provider recommended him to come back to the hospital. On his initial physical examination his blood pressure was 114/57, HR 81, RR 18 and 02 saturation 98%, lungs with no wheezing, rales or rhonchi, heart with S1 and S2 present and regular, abdomen with no distention or tenderness, no lower extremity edema.   Na 137, K 4,4 Cl 107 bicarbonate 22, glucose 170 bun 26 cr 1,26  Wbc 4.2 hgb 8,4 plt 177  Urina analysis with SG 1.024 with >500 glucose, negative protein   Chest radiograph with cardiomegaly with no infiltrates, pacemaker in place with one atrial and one ventricular lead.  CT chest with filling defect in the right lower lobe pulmonary artery consistent with pulmonary embolism.  Cholelithiasis.   Preliminary [place] lower extremities positive for DVT   Patient was placed on IV heparin for anticoagulation.   11/24 hgb has been stable and tolerating well IV heparin. Capsule endoscopy per GI.   Assessment and Plan: * GIB (gastrointestinal bleeding) Patient with no ulcers on recent GI evaluation. Follow up hgb is 9.1   Continue IV pantoprazole for now. Continue H&H monitoring q 8 hrs for now Follow up with capsule endoscopy results.   Acute pulmonary embolism ([place]) Patient with acute PE and acute  left lower extremity deep vein thrombosis involving the posterior tibial veins, superficial vein thrombosis left small saphenous vein.   Echocardiogram with preserved LV systolic function, mild LVH, RV systolic function is preserved. No significant valvular disease.  LV apical septal segment and apex are hypokinetic.  RVSP 27.2 trivial pericardial effusion.   Plan to continue anticoagulation with IV heparin, will transition to oral anticoagulation if patient has no active bleeding, follow up with capsule endoscopy and GI recommendations.   AKI (acute kidney injury) ([place]) Renal function continue to be stable with serum cr at 1,0, K is 4,2 and serum bicarbonate at 25. Continue close follow up of renal function and electrolytes. Hold on IV fluids  Atrial fibrillation Patient with paced rhythm on last EKG, he is not on AV blocking agents Continue anticoagulation with IV heparin, eventually transition to oral anticoagulation if no bleeding complications.   Hypertension Continue blood pressure monitoring, at home patient on lisinopril and furosemide.  Systolic blood pressure 382 to 105 mmHg.  Continue to hold on antihypertensive medications.    Coronary artery disease Hold on antiplatelet therapy for now to reduce risk of bleeding  Patient with no chest pain.   Type 2 diabetes mellitus with hyperglycemia (HCC) Continue glucose cover and monitoring with insulin sliding scale. Continue statin therapy.   Chronic obstructive pulmonary disease (HCC) No clinical signs of COPD exacerbation, continue oxymetry monitoring.   History of prostate cancer Follow up as outpatient.   Depression Continue sertraline   Class 1 obesity Calculated BMI 30,3        Subjective:  Patient with no chest pain, dyspnea has improved, no abdominal pain and denies melena or hematochezia   Physical Exam: Vitals:   03/09/22 0805 03/09/22 1226 03/09/22 2046 03/10/22 0535  BP:  109/67 112/70 105/65   Pulse:  71 70 70  Resp:  '16 18 16  '$ Temp:  97.6 F (36.4 C) 98.1 F (36.7 C) 97.9 F (36.6 C)  TempSrc:  Oral Oral Oral  SpO2: 98% 100% 99% 100%  Weight:      Height:       Neurology awake and alert ENT with no pallor or icterus Cardiovascular with S1 and S2 present and regular with no gallops or rubs, no murmurs Respiratory with no rales or wheezing, no rhonchi Abdomen with no distention  No lower extremity edema  Data Reviewed:    Family Communication: no family at the bedside  Disposition: Status is: Inpatient Remains inpatient appropriate because: acute PE and DVT with IV heparin   Planned Discharge Destination: Home     Author: Tawni Millers, MD 03/10/2022 12:46 PM  For on call review [email or web address].

## 2022-03-10 NOTE — Progress Notes (Signed)
Subjective: No bowel movement since admission. No melena.  Objective: Vital signs in last 24 hours: Temp:  [97.9 F (36.6 C)-98.1 F (36.7 C)] 97.9 F (36.6 C) (11/24 0535) Pulse Rate:  [70] 70 (11/24 0535) Resp:  [16-18] 16 (11/24 0535) BP: (105-112)/(65-70) 105/65 (11/24 0535) SpO2:  [99 %-100 %] 100 % (11/24 0535) Weight change:  Last BM Date : 03/08/22  PE: GEN:  NAD HEENT:  Anicteric NEURO:  A/O, no encephalopathy PSYCH:  Normal Mood/Affect  Lab Results:  CBC    Component Value Date/Time   WBC 4.3 03/10/2022 0045   RBC 2.90 (L) 03/10/2022 0045   HGB 9.0 (L) 03/10/2022 1300   HGB 15.4 05/28/2019 1202   HCT 29.3 (L) 03/10/2022 1300   HCT 45.4 05/28/2019 1202   PLT 179 03/10/2022 0045   PLT 138 (L) 05/28/2019 1202   MCV 96.9 03/10/2022 0045   MCV 96 05/28/2019 1202   MCV 92 01/23/2013 0151   MCH 30.3 03/10/2022 0045   MCHC 31.3 03/10/2022 0045   RDW 15.3 03/10/2022 0045   RDW 13.1 05/28/2019 1202   RDW 14.0 01/23/2013 0151   LYMPHSABS 1.0 03/08/2022 1233   LYMPHSABS 3.7 (H) 01/23/2013 0151   MONOABS 0.4 03/08/2022 1233   MONOABS 0.7 01/23/2013 0151   EOSABS 0.1 03/08/2022 1233   EOSABS 0.4 01/23/2013 0151   BASOSABS 0.0 03/08/2022 1233   BASOSABS 0.0 01/23/2013 0151  CMP     Component Value Date/Time   NA 141 03/10/2022 0045   NA 139 06/23/2019 1106   NA 141 01/23/2013 0151   K 4.2 03/10/2022 0045   K 3.4 (L) 01/23/2013 0151   CL 111 03/10/2022 0045   CL 107 01/23/2013 0151   CO2 25 03/10/2022 0045   CO2 26 01/23/2013 0151   GLUCOSE 112 (H) 03/10/2022 0045   GLUCOSE 97 01/23/2013 0151   BUN 12 03/10/2022 0045   BUN 22 06/23/2019 1106   BUN 23 (H) 01/23/2013 0151   CREATININE 1.05 03/10/2022 0045   CREATININE 1.06 05/27/2021 1515   CALCIUM 8.7 (L) 03/10/2022 0045   CALCIUM 8.4 (L) 01/23/2013 0151   PROT 5.4 (L) 03/09/2022 0103   PROT 6.3 05/28/2019 1202   PROT 6.5 01/23/2013 0151   ALBUMIN 3.3 (L) 03/09/2022 0103   ALBUMIN 4.4 05/28/2019  1202   ALBUMIN 3.5 01/23/2013 0151   AST 13 (L) 03/09/2022 0103   AST 17 01/23/2013 0151   ALT 16 03/09/2022 0103   ALT 27 01/23/2013 0151   ALKPHOS 40 03/09/2022 0103   ALKPHOS 83 01/23/2013 0151   BILITOT 0.6 03/09/2022 0103   BILITOT 0.4 05/28/2019 1202   BILITOT 0.3 01/23/2013 0151   GFRNONAA >60 03/10/2022 0045   GFRNONAA 80 03/28/2017 1433   GFRAA 72 06/23/2019 1106   GFRAA 93 03/28/2017 1433   Assessment:   Dark stools. Anemia, Hgb now about same as prior hospital discharge. Multiple endoscopic and colonoscopic procedures, including EGD one week ago for melena/anemia which patient reports hasn't remitted, and procedure of which was unrevealing. DVT + PE, anticoagulation.  Plan:  Capsule study administered today; hopefully can download and review tomorrow. PPI. Overall, I am not convinced patient is having significant bleeding, as despite his reports on continued dark stools, his Hgb has not declined since his prior hospitalization. As such, if capsule endoscopy is unrevealing, I would likely not pursue any further GI/Endoscopic evaluation unless it is clear patient is having ongoing bleeding as manifested by clear bleeding AND drop in  hemoglobin. Decision on timing and type of anticoagulation will at least in part depend upon findings on capsule endoscopy. OK to advance diet as allowed per post-capsule administration protocol.   Landry Dyke 03/10/2022, 1:27 PM   Cell [phone] If no answer or after 5 PM call [phone]

## 2022-03-10 NOTE — TOC Initial Note (Signed)
Transition of Care [facility]) - Initial/Assessment Note    Patient Details  Name: [name] Cook MRN: [id number] Date of Birth: [birth year]  Transition of Care [facility]) CM/SW Contact:    Leeroy Cha, RN Phone Number: 03/10/2022, 8:36 AM  Clinical Narrative:                  Transition of Care [facility]) Screening Note   Patient Details  Name: [name] Cook Date of Birth: 1950-10-16   Transition of Care [facility]) CM/SW Contact:    Leeroy Cha, RN Phone Number: 03/10/2022, 8:36 AM    Transition of Care Department [facility]) has reviewed patient and no TOC needs have been identified at this time. We will continue to monitor patient advancement through interdisciplinary progression rounds. If new patient transition needs arise, please place a TOC consult.    Expected Discharge Plan: Home/Self Care Barriers to Discharge: Continued Medical Work up   Patient Goals and CMS Choice Patient states their goals for this hospitalization and ongoing recovery are:: to go home and be well CMS Medicare.gov Compare Post Acute Care list provided to:: Patient    Expected Discharge Plan and Services Expected Discharge Plan: Home/Self Care   Discharge Planning Services: CM Consult   Living arrangements for the past 2 months: Mobile Home                                      Prior Living Arrangements/Services Living arrangements for the past 2 months: Mobile Home Lives with:: Self Patient language and need for interpreter reviewed:: Yes Do you feel safe going back to the place where you live?: Yes            Criminal Activity/Legal Involvement Pertinent to Current Situation/Hospitalization: No - Comment as needed  Activities of Daily Living Home Assistive Devices/Equipment: Eyeglasses ADL Screening (condition at time of admission) Patient's cognitive ability adequate to safely complete daily activities?: Yes Is the patient deaf or have difficulty hearing?: No Does the patient have  difficulty seeing, even when wearing glasses/contacts?: Yes Does the patient have difficulty concentrating, remembering, or making decisions?: No Patient able to express need for assistance with ADLs?: No Does the patient have difficulty dressing or bathing?: No Independently performs ADLs?: Yes (appropriate for developmental age) Does the patient have difficulty walking or climbing stairs?: No Weakness of Legs: Both Weakness of Arms/Hands: Both  Permission Sought/Granted                  Emotional Assessment Appearance:: Appears stated age Attitude/Demeanor/Rapport: Engaged Affect (typically observed): Calm Orientation: : Oriented to Self, Oriented to Place, Oriented to  Time, Oriented to Situation Alcohol / Substance Use: Tobacco Use (quit 8 years ago) Psych Involvement: No (comment)  Admission diagnosis:  Melena [K92.1] GIB (gastrointestinal bleeding) [K92.2] Pulmonary embolism without acute cor pulmonale, unspecified chronicity, unspecified pulmonary embolism type ([place]) [I26.99] Pulmonary embolism ([place]) [I26.99] Patient Active Problem List   Diagnosis Date Noted   Class 1 obesity 03/09/2022   Pulmonary embolism ([place]) 03/09/2022   GIB (gastrointestinal bleeding) 03/08/2022   Symptomatic anemia 03/08/2022   Acute pulmonary embolism ([place]) 03/08/2022   Anxiety 03/08/2022   AKI (acute kidney injury) ([place]) 03/08/2022   Upper GI bleeding 02/27/2022   Diaphoresis 10/26/2021   Shortness of breath 08/19/2021   Neuropathy due to secondary diabetes ([place]) 07/28/2021   History of diverticulitis 02/06/2020   Preventative health care 10/09/2019  Polyp of colon    History of prostate cancer 06/10/2018   OSA (obstructive sleep apnea) 10/01/2017   Osteoarthritis 06/26/2017   Insomnia 10/01/2014   Depression 03/09/2014   Coronary artery disease 11/06/2013   Long term current use of anticoagulant therapy 06/19/2013   Pacemaker 04/30/2013   Acute myocardial infarction,  subendocardial infarction, initial episode of care ([place]) 03/15/2013   Abnormal LFTs 05/02/2012   Chronic kidney disease, 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/26/2011   Chronic obstructive pulmonary disease ([place]) 04/20/2011   Atrial fibrillation    Hypertension    Hyperlipidemia    Type 2 diabetes mellitus with hyperglycemia ([place])    Atrial flutter ([place])    GASTROESOPHAGEAL REFLUX DISEASE 05/16/2010   PCP:  [name], NP Pharmacy:   [id number] [id number]: [phone]: [phone]    Social Determinants of Health (SDOH) Interventions    Readmission Risk Interventions   Row Labels 03/10/2022    8:32 AM  Readmission Risk Prevention Plan   Section Header. No data exists in this row.   Transportation Screening   Complete  PCP or Specialist Appt within 5-7 Days   Complete  Home Care Screening   Complete  Medication Review (RN CM)   Complete

## 2022-03-10 NOTE — Progress Notes (Signed)
ANTICOAGULATION CONSULT NOTE  Pharmacy Consult for Heparin Indication: atrial fibrillation, pulmonary embolus, and DVT  Allergies  Allergen Reactions   Januvia [Sitagliptin] Other (See Comments)    Elevated lipase   Metformin And Related Diarrhea    Patient Measurements: Height: '5\' 11"'$  (180.3 cm) Weight: 98.6 kg (217 lb 6 oz) IBW/kg (Calculated) : 75.3 Heparin Dosing Weight: 95 kg  Vital Signs:    Labs: Recent Labs    03/08/22 1233 03/08/22 1240 03/08/22 1426 03/08/22 1800 03/09/22 0103 03/09/22 1439 03/09/22 2112 03/10/22 0045 03/10/22 0633 03/10/22 1300 03/10/22 1805  HGB 8.4* 8.8*  --    < > 8.2*   < >  --  8.8* 9.1* 9.0* 9.1*  HCT 26.6* 26.0*  --    < > 26.0*   < >  --  28.1* 28.7* 29.3* 29.1*  PLT 177  --   --   --  153  --   --  179  --   --   --   LABPROT 13.6  --   --   --   --   --   --   --   --   --   --   INR 1.1  --   --   --   --   --   --   --   --   --   --   HEPARINUNFRC  --   --   --   --   --   --  1.05*  --  >1.10*  --  1.01*  CREATININE 1.26* 1.40*  --   --  1.02  --   --  1.05  --   --   --   TROPONINIHS 44*  --  35*  --   --   --   --   --   --   --   --    < > = values in this interval not displayed.     Estimated Creatinine Clearance: 79.5 mL/min (by C-G formula based on SCr of 1.05 mg/dL).  Assessment: 64 yoM admitted on 11/22 for new/worsening melena, ShOB and found to have PE.  Initially he was not started on anticoagulation due to bleeding.  PMH is significant for afib previously on warfarin anticoagulation that was stopped during recent admission (11/13-11/17) for melena, erosive gastritis requiring Vitamin K reversal and PRBC transfusion (11/13, 11/14, 11/16) 11/22 CT:  RLL PE.  Baseline labs: Hgb 7.7, INR 1.1 11/23 CBC: Hgb improved to 8.2, Dopplers + LLE DVT.  Pharmacy is consulted to dose Heparin IV.  No further bleeding reported today. Per Dr. Cathlean Sauer, [place] to give initial bolus and keep normal goal range.  03/10/22 Heparin level  at 1805  = 1.01 - remains supra-therapeutic after heparin was held x 1 hour and reduced to 1150 units/hr 1805 Hgb = 9.1 (remains stable) No complications of therapy noted RN confirmed blood drawn from left arm & heparin infusing in right arm  Goal of Therapy:  Heparin level 0.3-0.7 units/ml Monitor platelets by anticoagulation protocol: Yes   Plan:  Hold heparin drip x 1 hr then restart infusion at reduced rate of 900 units/hr Heparin level 8 hours after restarting heparin  Daily heparin level and CBC  Eudelia Bunch, Pharm.D Use secure chat for questions 03/10/2022 7:10 PM

## 2022-03-10 NOTE — Progress Notes (Signed)
ANTICOAGULATION CONSULT NOTE -   Consult  Pharmacy Consult for Heparin Indication: atrial fibrillation, pulmonary embolus, and DVT  Allergies  Allergen Reactions   Januvia [Sitagliptin] Other (See Comments)    Elevated lipase   Metformin And Related Diarrhea    Patient Measurements: Height: _0  (180.3 cm) Weight: 98.6 kg (217 lb 6 oz) IBW/kg (Calculated) : 75.3 Heparin Dosing Weight: 95 kg  Vital Signs: Temp: 97.9 F (36.6 C) (11/24 0535) Temp Source: Oral (11/24 0535) BP: 105/65 (11/24 0535) Pulse Rate: 70 (11/24 0535)  Labs: Recent Labs    03/08/22 1233 03/08/22 1240 03/08/22 1426 03/08/22 1800 03/09/22 0103 03/09/22 1439 03/09/22 1900 03/09/22 2112 03/10/22 0045 03/10/22 0633  HGB 8.4* 8.8*  --    < > 8.2*   < > 8.6*  --  8.8* 9.1*  HCT 26.6* 26.0*  --    < > 26.0*   < > 28.2*  --  28.1* 28.7*  PLT 177  --   --   --  153  --   --   --  179  --   LABPROT 13.6  --   --   --   --   --   --   --   --   --   INR 1.1  --   --   --   --   --   --   --   --   --   HEPARINUNFRC  --   --   --   --   --   --   --  1.05*  --  >1.10*  CREATININE 1.26* 1.40*  --   --  1.02  --   --   --  1.05  --   TROPONINIHS 44*  --  35*  --   --   --   --   --   --   --    < > = values in this interval not displayed.     Estimated Creatinine Clearance: 79.5 mL/min (by C-G formula based on SCr of 1.05 mg/dL).   Medical History: Past Medical History:  Diagnosis Date   Acute bronchitis with COPD ([place]) 03/07/2021   Arthritis    "knees and lower back" (03/14/2013)   Atrial flutter ([place])    radiofrequency ablation in 2001   CAD (coronary artery disease)    a. Nonobstructive. Cardiac cath in 2001-50% mid RI, normal LM, LAD, RCA b. cath 10/16/2014 95% mid RCA treated with DES, 99% ost D1 medical management due to small aneurysmal segment   Chronic anticoagulation    chronic Coumadin anticoagulation   Chronic obstructive pulmonary disease ([place]) 04/20/2011   Diabetes mellitus, type 2  (HCC)    Elevated lipase 06/23/2021   GERD (gastroesophageal reflux disease)    Hyperlipidemia    Hypertension    with hypertensive heart disease   Left knee pain 10/25/2017   medial   Obesity    Persistent atrial fibrillation ([place])    recurrent atrial flutter since 2001 s/p DCCVs, multiple failed AADs, h/o tachy-mediated cardiomyopathy   Shortness of breath    "can come on at any time" (03/14/2013)   Sleep apnea    "dx'd; couldn't wear the mask" (03/14/2013)   Tobacco abuse     Medications:  Medications Prior to Admission  Medication Sig Dispense Refill Last Dose   albuterol (VENTOLIN HFA) 108 (90 Base) MCG/ACT inhaler INHALE 2 PUFFS BY MOUTH EVERY 4 HOURS AS NEEDED FOR WHEEZE OR FOR  SHORTNESS OF BREATH (Patient taking differently: Inhale 2 puffs into the lungs every 4 (four) hours as needed for wheezing or shortness of breath.) 8.5 each 0 Past Month   Ascorbic Acid (VITAMIN C) 1000 MG tablet Take 2,000 mg by mouth daily.   03/08/2022   budesonide-formoterol (SYMBICORT) 160-4.5 MCG/ACT inhaler INHALE 2 PUFFS INTO THE LUNGS TWICE A DAY (Patient taking differently: Inhale 2 puffs into the lungs 2 (two) times daily as needed ("for flares").) 10.2 each 5 Past Month   clopidogrel (PLAVIX) 75 MG tablet TAKE 1 TABLET BY MOUTH EVERY DAY (Patient taking differently: Take 75 mg by mouth daily.) 90 tablet 2 03/08/2022   fish oil-omega-3 fatty acids 1000 MG capsule Take 2 g by mouth daily.   03/08/2022   fluticasone (FLONASE) 50 MCG/ACT nasal spray PLACE 1 SPRAY INTO BOTH NOSTRILS DAILY AS NEEDED FOR ALLERGIES OR RHINITIS. (Patient taking differently: Place 1 spray into both nostrils daily as needed for allergies or rhinitis.) 48 mL 3 Past Week   furosemide (LASIX) 20 MG tablet TAKE 1 TABLET BY MOUTH EVERY DAY (Patient taking differently: Take 20 mg by mouth in the morning.) 90 tablet 2 03/08/2022   gabapentin (NEURONTIN) 300 MG capsule TAKE 1 CAPSULE BY MOUTH EVERY MORNING AND 2 CAPSULES BY MOUTH AT  NIGHT FOR NEUROPATHY (Patient taking differently: Take 300 mg by mouth in the morning and at bedtime.) 270 capsule 3 03/08/2022   glipiZIDE (GLUCOTROL XL) 10 MG 24 hr tablet TAKE 1 TABLET (10 MG TOTAL) BY MOUTH DAILY WITH BREAKFAST. FOR DIABETES. (Patient taking differently: Take 10 mg by mouth daily with breakfast.) 90 tablet 1 03/08/2022   isosorbide mononitrate (IMDUR) 60 MG 24 hr tablet TAKE 1 TABLET BY MOUTH EVERY DAY (Patient taking differently: Take 60 mg by mouth daily.) 90 tablet 1 03/08/2022   JARDIANCE 25 MG TABS tablet TAKE 1 TABLET (25 MG TOTAL) BY MOUTH DAILY BEFORE BREAKFAST. FOR DIABETES. (Patient taking differently: 25 mg daily before breakfast.) 90 tablet 1 03/08/2022   lisinopril (ZESTRIL) 2.5 MG tablet TAKE 1 TABLET BY MOUTH EVERY DAY (Patient taking differently: Take 2.5 mg by mouth in the morning.) 90 tablet 3 03/08/2022   nitroGLYCERIN (NITROSTAT) 0.4 MG SL tablet Place 1 tablet (0.4 mg total) under the tongue every 5 (five) minutes as needed for chest pain. 25 tablet 0 unknown   pantoprazole (PROTONIX) 40 MG tablet Take 1 tablet (40 mg total) by mouth 2 (two) times daily. 60 tablet 2 03/08/2022   rosuvastatin (CRESTOR) 20 MG tablet TAKE 1 TABLET BY MOUTH EVERY DAY IN THE EVENING FOR CHOLESTEROL (Patient taking differently: Take 20 mg by mouth at bedtime.) 90 tablet 3 03/07/2022   sertraline (ZOLOFT) 50 MG tablet TAKE 1 TABLET (50 MG TOTAL) BY MOUTH DAILY. FOR ANXIETY AND DEPRESSION. (Patient taking differently: Take 50 mg by mouth daily.) 90 tablet 2 03/08/2022   tamsulosin (FLOMAX) 0.4 MG CAPS capsule TAKE 1 CAPSULE (0.4 MG TOTAL) BY MOUTH 2 (TWO) TIMES DAILY. (Patient taking differently: Take 0.4 mg by mouth at bedtime.) 180 capsule 3 03/07/2022   TYLENOL 500 MG tablet Take 500-1,000 mg by mouth every 6 (six) hours as needed for mild pain or headache.   Past Month   blood glucose meter kit and supplies KIT Dispense based on patient and insurance preference. Use up to four times  daily as directed. (FOR ICD-9 250.00, 250.01). 1 each 0    glucose blood test strip 1 each by Other route as needed for other. Use  to check blood sugar up to three times a day-Accu-Chek meter      Infusions:   heparin 1,150 Units/hr (03/10/22 0949)    Assessment: 55 yoM admitted on 11/22 for new/worsening melena, ShOB and found to have PE.  Initially he was not started on anticoagulation due to bleeding.  PMH is significant for afib previously on warfarin anticoagulation that was stopped during recent admission (11/13-11/17) for melena, erosive gastritis requiring Vitamin K reversal and PRBC transfusion (11/13, 11/14, 11/16) 11/22 CT:  RLL PE.  Baseline labs: Hgb 7.7, INR 1.1 11/23 CBC: Hgb improved to 8.2, Dopplers + LLE DVT.  Pharmacy is consulted to dose Heparin IV.  No further bleeding reported today. Per Dr. Cathlean Sauer, [place] to give initial bolus and keep normal goal range.  03/10/22 Heparin level =  >1.10 (supratherapeutic) with heparin gtt @ 1350 units/hr 19:00 Hgb = 9.1 (stable) No complications of therapy noted RN confirmed heparin level was drawn from arm opposite heparin infusion  Goal of Therapy:  Heparin level 0.3-0.7 units/ml Monitor platelets by anticoagulation protocol: Yes   Plan:  Hold heparin gtt x 1 hr then restart infusion at reduced rate of 1150 units/hr Heparin level 8 hours after restarting heparin  Daily heparin level and CBC  Royetta Asal, PharmD, BCPS 03/10/2022 9:58 AM

## 2022-03-10 NOTE — OR Nursing (Signed)
Patient swallowed pillcam video capsule at 10am without issue. Instructions given to advance diet per handout. Nursing can remove recorder 2200hrs tonight and endo staff will pick up recorder tomorrow morning.

## 2022-03-11 ENCOUNTER — Other Ambulatory Visit (HOSPITAL_COMMUNITY): Payer: Self-pay

## 2022-03-11 DIAGNOSIS — K921 Melena: Secondary | ICD-10-CM | POA: Diagnosis not present

## 2022-03-11 DIAGNOSIS — I2699 Other pulmonary embolism without acute cor pulmonale: Secondary | ICD-10-CM | POA: Diagnosis not present

## 2022-03-11 LAB — GLUCOSE, CAPILLARY
Glucose-Capillary: 172 mg/dL — ABNORMAL HIGH (ref 70–99)
Glucose-Capillary: 159 mg/dL — ABNORMAL HIGH (ref 70–99)
Glucose-Capillary: 155 mg/dL — ABNORMAL HIGH (ref 70–99)
Glucose-Capillary: 219 mg/dL — ABNORMAL HIGH (ref 70–99)

## 2022-03-11 LAB — BASIC METABOLIC PANEL
Anion gap: 7 (ref 5–15)
BUN: 16 mg/dL (ref 8–23)
CO2: 24 mmol/L (ref 22–32)
Calcium: 8.6 mg/dL — ABNORMAL LOW (ref 8.9–10.3)
Chloride: 107 mmol/L (ref 98–111)
Creatinine, Ser: 1.19 mg/dL (ref 0.61–1.24)
GFR, Estimated: >60 mL/min (ref >60)
Glucose, Bld: 221 mg/dL — ABNORMAL HIGH (ref 70–99)
Potassium: 4.4 mmol/L (ref 3.5–5.1)
Sodium: 138 mmol/L (ref 135–145)

## 2022-03-11 LAB — HEPARIN LEVEL (UNFRACTIONATED): Heparin Unfractionated: 0.71 IU/mL — ABNORMAL HIGH (ref 0.30–0.70)

## 2022-03-11 LAB — CBC
HCT: 30 % — ABNORMAL LOW (ref 39.0–52.0)
Hemoglobin: 9.2 g/dL — ABNORMAL LOW (ref 13.0–17.0)
MCH: 29.5 pg (ref 26.0–34.0)
MCHC: 30.7 g/dL (ref 30.0–36.0)
MCV: 96.2 fL (ref 80.0–100.0)
Platelets: 186 10*3/uL (ref 150–400)
RBC: 3.12 MIL/uL — ABNORMAL LOW (ref 4.22–5.81)
RDW: 15 % (ref 11.5–15.5)
WBC: 4.4 10*3/uL (ref 4.0–10.5)
nRBC: 0 % (ref 0.0–0.2)

## 2022-03-11 MED ORDER — APIXABAN (ELIQUIS) VTE STARTER PACK (10MG AND 5MG)
ORAL_TABLET | ORAL | 0 refills | Status: DC
  Filled 2022-03-11: qty 74, 30d supply, fill #0

## 2022-03-11 MED ORDER — APIXABAN 5 MG PO TABS
10.0000 mg | ORAL_TABLET | ORAL | Status: AC
  Administered 2022-03-11: 10 mg via ORAL
  Filled 2022-03-11: qty 2

## 2022-03-11 MED ORDER — APIXABAN 5 MG PO TABS
10.0000 mg | ORAL_TABLET | Freq: Two times a day (BID) | ORAL | Status: DC
  Administered 2022-03-11 – 2022-03-12 (×2): 10 mg via ORAL
  Filled 2022-03-11 (×3): qty 2

## 2022-03-11 MED ORDER — APIXABAN 5 MG PO TABS: 5.0000 mg | ORAL_TABLET | Freq: Two times a day (BID) | ORAL | Status: DC

## 2022-03-11 NOTE — Progress Notes (Addendum)
ANTICOAGULATION CONSULT NOTE - Follow Up Consult  Pharmacy Consult for Heparin>>Eliquis Indication:  h/o afib, new DVT and PE  Allergies  Allergen Reactions   Januvia [Sitagliptin] Other (See Comments)    Elevated lipase   Metformin And Related Diarrhea    Patient Measurements: Height: '5\' 11"'$  (180.3 cm) Weight: 98.6 kg (217 lb 6 oz) IBW/kg (Calculated) : 75.3 Heparin Dosing Weight:  96kg  Vital Signs: Temp: 97.8 F (36.6 C) (11/25 0449) Temp Source: Oral (11/25 0449) BP: 112/62 (11/25 0449) Pulse Rate: 70 (11/25 0449)  Labs: Recent Labs    03/08/22 1233 03/08/22 1240 03/08/22 1426 03/08/22 1800 03/09/22 0103 03/09/22 1439 03/10/22 0045 03/10/22 0633 03/10/22 1300 03/10/22 1805 03/11/22 0759  HGB 8.4*   < >  --    < > 8.2*   < > 8.8* 9.1* 9.0* 9.1* 9.2*  HCT 26.6*   < >  --    < > 26.0*   < > 28.1* 28.7* 29.3* 29.1* 30.0*  PLT 177  --   --   --  153  --  179  --   --   --  186  LABPROT 13.6  --   --   --   --   --   --   --   --   --   --   INR 1.1  --   --   --   --   --   --   --   --   --   --   HEPARINUNFRC  --   --   --   --   --    < >  --  >1.10*  --  1.01* 0.71*  CREATININE 1.26*   < >  --   --  1.02  --  1.05  --   --   --  1.19  TROPONINIHS 44*  --  35*  --   --   --   --   --   --   --   --    < > = values in this interval not displayed.    Estimated Creatinine Clearance: 70.1 mL/min (by C-G formula based on SCr of 1.19 mg/dL).   Assessment:  AC/Heme: IV heparin for h/o atrial fibrillation, new pulmonary embolus, and new DVT . Hgb 9.2 stable. Plts WNL, HL 0.71 slightly above goal.  11/22 CT:  RLL PE.  Baseline labs: Hgb 7.7, INR 1.1 11/23  Dopplers + LLE DVT  Goal of Therapy:  Heparin level 0.3-0.7 units/ml Monitor platelets by anticoagulation protocol: Yes   Plan:  Decrease IV heparin to 850 units/hr Recheck heparin level in 8 hrs. Daily HL and CBCC  D/c IV heparin Start Eliquis '10mg'$  BID x 7d then '5mg'$  BID  Kariem Wolfson S. Alford Highland,  PharmD, BCPS Clinical Staff Pharmacist [email or web address] Alford Highland, [employer] 03/11/2022,10:37 AM

## 2022-03-11 NOTE — Progress Notes (Signed)
[facility] Gastroenterology Progress Note  [name] Cook 71 y.o. 1951/03/17  CC: GI bleed   Subjective: Patient seen and examined at bedside.  Family and RN at bedside.  No new GI issues.  ROS : aFebrile, negative for chest pain   Objective: Vital signs in last 24 hours: Vitals:   03/11/22 0809 03/11/22 1154  BP:  105/66  Pulse:  70  Resp:  18  Temp:  97.8 F (36.6 C)  SpO2: 99% 100%    Physical Exam:  General:  Alert, cooperative, no distress, appears stated age  Head:  Normocephalic, without obvious abnormality, atraumatic  Eyes:  , EOM's intact,   Lungs:   Clear to auscultation bilaterally, respirations unlabored  Heart:  Regular rate and rhythm, S1, S2 normal  Abdomen:   Soft, non-tender, nondistended, bowel sounds present  Extremities: Extremities normal, atraumatic, no  edema  Pulses: 2+ and symmetric    Lab Results: Recent Labs    03/10/22 0045 03/11/22 0759  NA 141 138  K 4.2 4.4  CL 111 107  CO2 25 24  GLUCOSE 112* 221*  BUN 12 16  CREATININE 1.05 1.19  CALCIUM 8.7* 8.6*   Recent Labs    03/09/22 0103  AST 13*  ALT 16  ALKPHOS 40  BILITOT 0.6  PROT 5.4*  ALBUMIN 3.3*   Recent Labs    03/10/22 0045 03/10/22 0633 03/10/22 1805 03/11/22 0759  WBC 4.3  --   --  4.4  HGB 8.8*   < > 9.1* 9.2*  HCT 28.1*   < > 29.1* 30.0*  MCV 96.9  --   --  96.2  PLT 179  --   --  186   < > = values in this interval not displayed.   No results for input(s): "LABPROT", "INR" in the last 72 hours.    Assessment/Plan: -Recurrent GI bleed/melena.  EGD earlier this month showed erosive gastritis.  Multiple EGDs and colonoscopies in the past. -H/O  DVT and PE. -History of atrial fibrillation and coronary artery disease  Recommendations ------------------------- -Capsule study today showed no evidence of active bleeding. -Okay to resume oral anticoagulation from GI standpoint.  Recommend to continue Protonix 40 mg daily for gastritis.  No further inpatient GI  workup planned.   -Okay to discharge from GI standpoint.GI will sign off.  Call us back if needed.  Otis Brace MD, [place] 03/11/2022, 12:44 PM  Contact #  [phone]

## 2022-03-11 NOTE — Plan of Care (Signed)

## 2022-03-11 NOTE — Discharge Instructions (Signed)
Information on my medicine - ELIQUIS (apixaban)  This medication education was reviewed with me or my healthcare representative as part of my discharge preparation.  The pharmacist that spoke with me during my hospital stay was:  Wayland Salinas, [facility]  Why was Eliquis prescribed for you? Eliquis was prescribed to treat blood clots that may have been found in the veins of your legs (deep vein thrombosis) or in your lungs (pulmonary embolism) and to reduce the risk of them occurring again.  What do You need to know about Eliquis ? The starting dose is 10 mg (two 5 mg tablets) taken TWICE daily for the FIRST SEVEN (7) DAYS, then on (enter date)  03/18/2022  the dose is reduced to ONE 5 mg tablet taken TWICE daily.  Eliquis may be taken with or without food.   Try to take the dose about the same time in the morning and in the evening. If you have difficulty swallowing the tablet whole please discuss with your pharmacist how to take the medication safely.  Take Eliquis exactly as prescribed and DO NOT stop taking Eliquis without talking to the doctor who prescribed the medication.  Stopping may increase your risk of developing a new blood clot.  Refill your prescription before you run out.  After discharge, you should have regular check-up appointments with your healthcare provider that is prescribing your Eliquis.    What do you do if you miss a dose? If a dose of ELIQUIS is not taken at the scheduled time, take it as soon as possible on the same day and twice-daily administration should be resumed. The dose should not be doubled to make up for a missed dose.  Important Safety Information A possible side effect of Eliquis is bleeding. You should call your healthcare provider right away if you experience any of the following: Bleeding from an injury or your nose that does not stop. Unusual colored urine (red or dark brown) or unusual colored stools (red or black). Unusual  bruising for unknown reasons. A serious fall or if you hit your head (even if there is no bleeding).  Some medicines may interact with Eliquis and might increase your risk of bleeding or clotting while on Eliquis. To help avoid this, consult your healthcare provider or pharmacist prior to using any new prescription or non-prescription medications, including herbals, vitamins, non-steroidal anti-inflammatory drugs (NSAIDs) and supplements.  This website has more information on Eliquis (apixaban): http://www.eliquis.com/eliquis/home

## 2022-03-11 NOTE — Progress Notes (Addendum)
Progress Note   Patient: [name] Cook VEH:[id number] DOB: 06-16-1950 DOA: 03/08/2022     2 DOS: the patient was seen and examined on 03/11/2022   Brief hospital course: 71 yo male with the past medical history atrial fibrillation, coronary artery disease, hypertension and T2DM who presented with melena. He had a recent hospitalization 11/13 to 03/03/22 for erosive gastritis, he required 4 units PRBC transfusion and warfarin was discontinued. Patient was instructed to continue taking clopidogrel. His discharge hgb was 8.1. At home he had fatigue and dyspnea, had recurrent melena. His primary care provider recommended him to come back to the hospital. CT chest with filling defect in the right lower lobe pulmonary artery consistent with pulmonary embolism.  [place] lower extremities positive for DVT  Patient was placed on IV heparin for anticoagulation.   11/24 hgb has been stable and tolerating well IV heparin. Capsule endoscopy per GI.   Assessment and Plan: * GIB (gastrointestinal bleeding) Patient with no ulcers on recent GI evaluation. -hgb stable Follow up with capsule endoscopy results.   Acute pulmonary embolism ([place]) Patient with acute PE and acute left lower extremity deep vein thrombosis involving the posterior tibial veins, superficial vein thrombosis left small saphenous vein.  -echo done - continue anticoagulation with IV heparin, will transition to oral anticoagulation if patient has no active bleeding, follow up with capsule endoscopy and GI recommendations.  Addendum: will plan to start eliquis as capsule negative  AKI (acute kidney injury) ([place]) Renal function continue to be stable with serum cr at 1,0, K is 4,2 and serum bicarbonate at 25. Continue close follow up of renal function and electrolytes.   Atrial fibrillation Patient with paced rhythm on last EKG, he is not on AV blocking agents Continue anticoagulation with IV heparin, eventually transition to oral  anticoagulation if no bleeding complications.   Hypertension Continue blood pressure monitoring, at home patient on lisinopril and furosemide.   Coronary artery disease Hold on antiplatelet therapy for now to reduce risk of bleeding  Patient with no chest pain.   Type 2 diabetes mellitus with hyperglycemia (HCC) -SSI  Chronic obstructive pulmonary disease (HCC) No clinical signs of COPD exacerbation, continue oxymetry monitoring.   History of prostate cancer Follow up as outpatient.   Depression Continue sertraline   Class 1 obesity Estimated body mass index is 30.32 kg/m as calculated from the following:   Height as of this encounter: '5\' 11"'$  (1.803 m).   Weight as of this encounter: 98.6 kg.       Subjective: no melena Eating well  Physical Exam: Vitals:   03/10/22 1948 03/10/22 2152 03/11/22 0449 03/11/22 0809  BP:  125/61 112/62   Pulse:  70 70   Resp:  20 20   Temp:  97.8 F (36.6 C) 97.8 F (36.6 C)   TempSrc:  Oral Oral   SpO2: 99% 100% 99% 99%  Weight:      Height:        General: Appearance:    Obese male in no acute distress     Lungs:     Clear to auscultation bilaterally, respirations unlabored  Heart:    Normal heart rate.   MS:   All extremities are intact.   Neurologic:   Awake, alert, oriented x 3.       Family Communication: no family at the bedside  Disposition: Status is: Inpatient Remains inpatient appropriate because: acute PE and DVT with IV heparin   Planned Discharge Destination: Home  Author: Geradine Girt, DO 03/11/2022 10:49 AM  For on call review [email or web address].

## 2022-03-12 DIAGNOSIS — E1165 Type 2 diabetes mellitus with hyperglycemia: Secondary | ICD-10-CM | POA: Diagnosis not present

## 2022-03-12 DIAGNOSIS — K921 Melena: Secondary | ICD-10-CM | POA: Diagnosis not present

## 2022-03-12 DIAGNOSIS — I2699 Other pulmonary embolism without acute cor pulmonale: Secondary | ICD-10-CM | POA: Diagnosis not present

## 2022-03-12 LAB — BASIC METABOLIC PANEL
Anion gap: 8 (ref 5–15)
BUN: 18 mg/dL (ref 8–23)
CO2: 23 mmol/L (ref 22–32)
Calcium: 8.6 mg/dL — ABNORMAL LOW (ref 8.9–10.3)
Chloride: 109 mmol/L (ref 98–111)
Creatinine, Ser: 1.18 mg/dL (ref 0.61–1.24)
GFR, Estimated: >60 mL/min (ref >60)
Glucose, Bld: 129 mg/dL — ABNORMAL HIGH (ref 70–99)
Potassium: 4.1 mmol/L (ref 3.5–5.1)
Sodium: 140 mmol/L (ref 135–145)

## 2022-03-12 LAB — CBC
HCT: 29.1 % — ABNORMAL LOW (ref 39.0–52.0)
Hemoglobin: 9.1 g/dL — ABNORMAL LOW (ref 13.0–17.0)
MCH: 29.5 pg (ref 26.0–34.0)
MCHC: 31.3 g/dL (ref 30.0–36.0)
MCV: 94.5 fL (ref 80.0–100.0)
Platelets: 193 10*3/uL (ref 150–400)
RBC: 3.08 MIL/uL — ABNORMAL LOW (ref 4.22–5.81)
RDW: 14.6 % (ref 11.5–15.5)
WBC: 4.4 10*3/uL (ref 4.0–10.5)
nRBC: 0 % (ref 0.0–0.2)

## 2022-03-12 LAB — GLUCOSE, CAPILLARY: Glucose-Capillary: 140 mg/dL — ABNORMAL HIGH (ref 70–99)

## 2022-03-12 MED ORDER — APIXABAN 5 MG PO TABS: 5.0000 mg | ORAL_TABLET | Freq: Two times a day (BID) | ORAL | 1 refills | Status: DC

## 2022-03-12 MED ORDER — PANTOPRAZOLE SODIUM 40 MG PO TBEC
40.0000 mg | DELAYED_RELEASE_TABLET | Freq: Every day | ORAL | Status: DC
  Administered 2022-03-12: 40 mg via ORAL
  Filled 2022-03-12: qty 1

## 2022-03-12 MED ORDER — PANTOPRAZOLE SODIUM 40 MG PO TBEC: 40.0000 mg | DELAYED_RELEASE_TABLET | Freq: Every day | ORAL | 2 refills | Status: DC

## 2022-03-12 NOTE — Discharge Summary (Signed)
Physician Discharge Summary  WINDLE HUEBERT MVE:[id number] DOB: 1950-10-12 DOA: 03/08/2022  PCP: [name], NP  Admit date: 03/08/2022 Discharge date: 03/12/2022  Admitted From: home Discharge disposition: home   Recommendations for Outpatient Follow-Up:   CBC 1 week Close follow up with cardiology to discuss stopping plavix while on elqiuis for his PE   Discharge Diagnosis:   Principal Problem:   GIB (gastrointestinal bleeding) Active Problems:   Acute pulmonary embolism (HCC)   AKI (acute kidney injury) ([place])   Atrial fibrillation   Hypertension   Coronary artery disease   Type 2 diabetes mellitus with hyperglycemia ([place])   Chronic obstructive pulmonary disease ([place])   History of prostate cancer   Depression   Class 1 obesity    Discharge Condition: Improved.  Diet recommendation: Low sodium, heart healthy.  Carbohydrate-modified.   Wound care: None.  Code status: Full.   History of Present Illness:   [name] Cook is a 71 y.o. male with medical history significant of persistent a fib s/p PPM, CAD, HTN, DM2. Presenting with dark stools and weakness. He was just discharged from the hospital on 11/17 after a stay for GIB. At the time, he required 4 units of pRBCs. At the time of discharge, his Hgb was 8.1. At discharge, it was decided that he would hold his coumadin, but continue his plavix. He reports that he's been constipated up until yesterday. He has had continued fatigue. When he had a BM yesterday, he noted it was dark. When he woke this morning, he had another dark BM. He felt even weaker and felt as if he were going to pass out. He spoke with his PCP and it was recommended that he come to the ED for evaluation. He denies any other aggravating or alleviating factors.      Hospital Course by Problem:    GIB (gastrointestinal bleeding) Patient with no ulcers on recent GI evaluation. -hgb stable -capsule endoscopy negative   Acute pulmonary  embolism ([place]) Patient with acute PE and acute left lower extremity deep vein thrombosis involving the posterior tibial veins, superficial vein thrombosis left small saphenous vein.  -echo done - continue anticoagulation with IV heparin, will transition to oral anticoagulation if patient has no active bleeding, follow up with capsule endoscopy and GI recommendations.  -GI ok to start eliquis as capsule negative   AKI (acute kidney injury) ([place]) Cr normal    Atrial fibrillation Patient with paced rhythm on last EKG, he is not on AV blocking agents Continue anticoagulation with IV heparin, eventually transition to oral anticoagulation if no bleeding complications.    Hypertension Continue blood pressure monitoring, at home patient on lisinopril and furosemide.    Coronary artery disease Hold on antiplatelet therapy for now to reduce risk of bleeding  Patient with no chest pain.    Type 2 diabetes mellitus with hyperglycemia (HCC) -SSI   Chronic obstructive pulmonary disease (HCC) No clinical signs of COPD exacerbation, continue oxymetry monitoring.    History of prostate cancer Follow up as outpatient.    Depression Continue sertraline    Class 1 obesity Estimated body mass index is 30.32 kg/m as calculated from the following:   Height as of this encounter: _0  (1.803 m).   Weight as of this encounter: 98.6 kg.       Medical Consultants:    GI  Discharge Exam:   Vitals:   03/12/22 0821 03/12/22 0850  BP:  (!) 110/59  Pulse:  69  Resp:  18  Temp:    SpO2: 98% 97%   Vitals:   03/11/22 2033 03/12/22 0319 03/12/22 0821 03/12/22 0850  BP:  115/61  (!) 110/59  Pulse:  72  69  Resp:  20  18  Temp:  98 F (36.7 C)    TempSrc:  Oral    SpO2: 98% 99% 98% 97%  Weight:      Height:        General exam: Appears calm and comfortable.    The results of significant diagnostics from this hospitalization (including imaging, microbiology, ancillary and  laboratory) are listed below for reference.     Procedures and Diagnostic Studies:   VAS [place] LOWER EXTREMITY VENOUS (DVT)  Result Date: 03/11/2022  Lower Venous DVT Study Patient Name:  GORO WENRICK  Date of Exam:   03/09/2022 Medical Rec #: [id number]    Accession #:    [id number] Date of Birth: [birth year]    Patient Gender: M Patient Age:   3 years Exam Location:  [facility] Procedure:      VAS [place] LOWER EXTREMITY VENOUS (DVT) Referring Phys: Cherylann Ratel --------------------------------------------------------------------------------  Indications: Pulmonary embolism.  Risk Factors: Confirmed PE. Limitations: Poor ultrasound/tissue interface. Comparison Study: No prior studies. Performing Technologist: Oliver Hum RVT  Examination Guidelines: A complete evaluation includes B-mode imaging, spectral Doppler, color Doppler, and power Doppler as needed of all accessible portions of each vessel. Bilateral testing is considered an integral part of a complete examination. Limited examinations for reoccurring indications may be performed as noted. The reflux portion of the exam is performed with the patient in reverse Trendelenburg.  +---------+---------------+---------+-----------+----------+--------------+ RIGHT    CompressibilityPhasicitySpontaneityPropertiesThrombus Aging +---------+---------------+---------+-----------+----------+--------------+ CFV      Full           Yes      Yes                                 +---------+---------------+---------+-----------+----------+--------------+ SFJ      Full                                                        +---------+---------------+---------+-----------+----------+--------------+ FV Prox  Full                                                        +---------+---------------+---------+-----------+----------+--------------+ FV Mid   Full                                                         +---------+---------------+---------+-----------+----------+--------------+ FV DistalFull                                                        +---------+---------------+---------+-----------+----------+--------------+ PFV  Full                                                        +---------+---------------+---------+-----------+----------+--------------+ POP      Full           Yes      Yes                                 +---------+---------------+---------+-----------+----------+--------------+ PTV      Full                                                        +---------+---------------+---------+-----------+----------+--------------+ PERO     Full                                                        +---------+---------------+---------+-----------+----------+--------------+   +---------+---------------+---------+-----------+----------+--------------+ LEFT     CompressibilityPhasicitySpontaneityPropertiesThrombus Aging +---------+---------------+---------+-----------+----------+--------------+ CFV      Full           Yes      Yes                                 +---------+---------------+---------+-----------+----------+--------------+ SFJ      Full                                                        +---------+---------------+---------+-----------+----------+--------------+ FV Prox  Full                                                        +---------+---------------+---------+-----------+----------+--------------+ FV Mid   Full                                                        +---------+---------------+---------+-----------+----------+--------------+ FV DistalFull                                                        +---------+---------------+---------+-----------+----------+--------------+ PFV      Full                                                         +---------+---------------+---------+-----------+----------+--------------+  POP      Full           Yes      Yes                                 +---------+---------------+---------+-----------+----------+--------------+ PTV      Partial                                      Acute          +---------+---------------+---------+-----------+----------+--------------+ PERO     Full                                                        +---------+---------------+---------+-----------+----------+--------------+ SSV      None                                         Acute          +---------+---------------+---------+-----------+----------+--------------+     Summary: RIGHT: - There is no evidence of deep vein thrombosis in the lower extremity.  - No cystic structure found in the popliteal fossa.  LEFT: - Findings consistent with acute deep vein thrombosis involving the left posterior tibial veins. - Findings consistent with acute superficial vein thrombosis involving the left small saphenous vein. - Findings suggest new clot progression as compared to previous examination. - No cystic structure found in the popliteal fossa.  *See table(s) above for measurements and observations. Electronically signed by Harold Barban MD on 03/11/2022 at 12:15:09 AM.    Final    ECHOCARDIOGRAM COMPLETE  Result Date: 03/09/2022    ECHOCARDIOGRAM REPORT   Patient Name:   GIORGI DEBRUIN Date of Exam: 03/09/2022 Medical Rec #:  [id number]   Height:       71.0 in Accession #:    [id number]  Weight:       217.4 lb Date of Birth:  04/30/1950   BSA:          2.185 m Patient Age:    45 years    BP:           108/59 mmHg Patient Gender: M           HR:           71 bpm. Exam Location:  Inpatient Procedure: 2D Echo Indications:    pulmonary embolus  History:        Patient has prior history of Echocardiogram examinations, most                 recent 11/26/2020. CAD, Pacemaker, COPD, Arrythmias:Atrial                  Fibrillation; Risk Factors:Hypertension, Dyslipidemia, Diabetes                 and Former Smoker.  Sonographer:    [place] Referring Phys: [id number] [place]  1. Left ventricular ejection fraction, by estimation, is 50%. The left ventricle has low normal function. Left ventricular endocardial border not optimally defined to evaluate regional wall motion. There is  mild concentric left ventricular hypertrophy. Left ventricular diastolic parameters are indeterminate.  2. Right ventricular systolic function is normal. The right ventricular size is normal. There is normal pulmonary artery systolic pressure.  3. Left atrial size was mildly dilated.  4. The mitral valve is grossly normal. No evidence of mitral valve regurgitation. No evidence of mitral stenosis.  5. The aortic valve is tricuspid. Aortic valve regurgitation is not visualized. Aortic valve sclerosis is present, with no evidence of aortic valve stenosis.  6. The inferior vena cava is dilated in size with >50% respiratory variability, suggesting right atrial pressure of 8 mmHg. Comparison(s): Apex is not as well defined in this study. FINDINGS  Left Ventricle: Left ventricular ejection fraction, by estimation, is 50%. The left ventricle has mildly decreased function. Left ventricular endocardial border not optimally defined to evaluate regional wall motion. The left ventricular internal cavity  size was normal in size. There is mild concentric left ventricular hypertrophy. Left ventricular diastolic parameters are indeterminate.  LV Wall Scoring: The apical septal segment and apex are hypokinetic. Right Ventricle: The right ventricular size is normal. No increase in right ventricular wall thickness. Right ventricular systolic function is normal. There is normal pulmonary artery systolic pressure. The tricuspid regurgitant velocity is 2.19 m/s, and  with an assumed right atrial pressure of 8 mmHg, the estimated right ventricular  systolic pressure is 46.2 mmHg. Left Atrium: Left atrial size was mildly dilated. Right Atrium: Right atrial size was normal in size. Pericardium: Trivial pericardial effusion is present. Presence of epicardial fat layer. Mitral Valve: The mitral valve is grossly normal. No evidence of mitral valve regurgitation. No evidence of mitral valve stenosis. Tricuspid Valve: The tricuspid valve is normal in structure. Tricuspid valve regurgitation is trivial. No evidence of tricuspid stenosis. Aortic Valve: The aortic valve is tricuspid. Aortic valve regurgitation is not visualized. Aortic valve sclerosis is present, with no evidence of aortic valve stenosis. Pulmonic Valve: The pulmonic valve was normal in structure. Pulmonic valve regurgitation is not visualized. No evidence of pulmonic stenosis. Aorta: The aortic root and ascending aorta are structurally normal, with no evidence of dilitation. Venous: The inferior vena cava is dilated in size with greater than 50% respiratory variability, suggesting right atrial pressure of 8 mmHg. IAS/Shunts: No atrial level shunt detected by color flow Doppler. Additional Comments: A device lead is visualized in the right ventricle and right atrium.  LEFT VENTRICLE PLAX 2D LVIDd:         4.90 cm   Diastology LVIDs:         3.10 cm   LV e' medial:  5.87 cm/s LV PW:         1.30 cm   LV e' lateral: 4.68 cm/s LV IVS:        1.20 cm LVOT diam:     2.40 cm LV SV:         56 LV SV Index:   26 LVOT Area:     4.52 cm  RIGHT VENTRICLE             IVC RV S prime:     12.10 cm/s  IVC diam: 2.40 cm TAPSE (M-mode): 1.6 cm LEFT ATRIUM             Index        RIGHT ATRIUM           Index LA diam:        3.30 cm 1.51 cm/m   RA Area:  18.30 cm LA Vol (A2C):   84.1 ml 38.50 ml/m  RA Volume:   48.00 ml  21.97 ml/m LA Vol (A4C):   79.9 ml 36.58 ml/m LA Biplane Vol: 81.8 ml 37.44 ml/m  AORTIC VALVE LVOT Vmax:   69.70 cm/s LVOT Vmean:  46.000 cm/s LVOT VTI:    0.124 m  AORTA Ao Root diam: 3.80 cm  Ao Asc diam:  3.40 cm TRICUSPID VALVE TR Peak grad:   19.2 mmHg TR Vmax:        219.00 cm/s  SHUNTS Systemic VTI:  0.12 m Systemic Diam: 2.40 cm Rudean Haskell MD Electronically signed by Rudean Haskell MD Signature Date/Time: 03/09/2022/9:13:34 AM    Final    US RENAL  Result Date: 03/08/2022 CLINICAL DATA:  Acute kidney injury EXAM: RENAL / URINARY TRACT ULTRASOUND COMPLETE COMPARISON:  02/27/2022 CT FINDINGS: Right Kidney: Renal measurements: 10.2 x 5.5 x 7.1 cm = volume: 209 mL. Echogenicity within normal limits. No mass or hydronephrosis visualized. Left Kidney: Renal measurements: 10.0 x 5.8 x 6.1 cm = volume: 185 mL. Echogenicity within normal limits. No mass or hydronephrosis visualized. Bladder: Appears normal for degree of bladder distention. Other: None. IMPRESSION: Normal study. Electronically Signed   By: Rolm Baptise M.D.   On: 03/08/2022 20:56   CT Angio Chest PE W and/or Wo Contrast  Result Date: 03/08/2022 CLINICAL DATA:  Black tarry stools, weakness, dizziness, shortness of breath, upper abdominal pain, clinical suspicion of pulmonary embolism, past history of coronary artery disease, type II diabetes mellitus, GERD, atrial fibrillation, former smoker EXAM: CT ANGIOGRAPHY CHEST WITH CONTRAST TECHNIQUE: Multidetector CT imaging of the chest was performed using the standard protocol during bolus administration of intravenous contrast. Multiplanar CT image reconstructions and MIPs were obtained to evaluate the vascular anatomy. RADIATION DOSE REDUCTION: This exam was performed according to the departmental dose-optimization program which includes automated exposure control, adjustment of the mA and/or kV according to patient size and/or use of iterative reconstruction technique. CONTRAST:  8m OMNIPAQUE IOHEXOL 350 MG/ML SOLN IV COMPARISON:  05/06/2021 FINDINGS: Cardiovascular: Atherosclerotic calcifications aorta, proximal great vessels, and coronary arteries. Aorta normal  caliber without aneurysm or dissection. Heart size normal. No pericardial effusion. Pacing leads RIGHT atrium and RIGHT ventricle. Pulmonary arteries well opacified. Filling defect identified in RIGHT lower lobe pulmonary artery consistent with pulmonary embolism. No additional pulmonary emboli identified. RV/LV ratio: 0.91, non elevated Mediastinum/Nodes: Esophagus unremarkable. No adenopathy. Base of cervical region normal appearance. Lungs/Pleura: Calcified granuloma superior segment RIGHT lower lobe image 74. Lungs otherwise clear. No pulmonary infiltrate, pleural effusion, or pneumothorax. Upper Abdomen: Dependent tiny calculi in gallbladder. Visualized upper abdomen otherwise unremarkable. Musculoskeletal: No acute osseous findings. Review of the MIP images confirms the above findings. IMPRESSION: Filling defect in RIGHT lower lobe pulmonary artery consistent with pulmonary embolism. Cholelithiasis. Scattered atherosclerotic calcifications including coronary arteries. Aortic Atherosclerosis (ICD10-I70.0). Critical Value/emergent results were called by telephone at the time of interpretation on 03/08/2022 at 1447 hrs to [facility], who verbally acknowledged these results. Electronically Signed   By: MLavonia DanaM.D.   On: 03/08/2022 14:47   DG Chest Port 1 View  Result Date: 03/08/2022 CLINICAL DATA:  Questionable sepsis - evaluate for abnormality. 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 The cardiomediastinal silhouette is unchanged with normal heart size. Aortic atherosclerosis is noted. No airspace consolidation, edema, sizable pleural effusion, or pneumothorax is identified no acute osseous abnormality is seen.  IMPRESSION: No active disease. Electronically Signed   By: Logan Bores M.D.   On: 03/08/2022 12:55     Labs:   Basic Metabolic Panel: Recent Labs  Lab 03/08/22 1233 03/08/22 1240 03/09/22 0103  03/10/22 0045 03/11/22 0759 03/12/22 0345  NA 137 137 141 141 138 140  K 4.4 4.4 3.8 4.2 4.4 4.1  CL 107 105 111 111 107 109  CO2 22  --  _0 GLUCOSE 170* 162* 96 112* 221* 129*  BUN 26* _1 CREATININE 1.26* 1.40* 1.02 1.05 1.19 1.18  CALCIUM 9.2  --  8.6* 8.7* 8.6* 8.6*  MG 2.1  --   --   --   --   --    GFR Estimated Creatinine Clearance: 70.7 mL/min (by C-G formula based on SCr of 1.18 mg/dL). Liver Function Tests: Recent Labs  Lab 03/08/22 1233 03/09/22 0103  AST 17 13*  ALT 18 16  ALKPHOS 46 40  BILITOT 0.7 0.6  PROT 5.9* 5.4*  ALBUMIN 3.5 3.3*   No results for input(s): "LIPASE", "AMYLASE" in the last 168 hours. No results for input(s): "AMMONIA" in the last 168 hours. Coagulation profile Recent Labs  Lab 03/08/22 1233  INR 1.1    CBC: Recent Labs  Lab 03/08/22 1233 03/08/22 1240 03/09/22 0103 03/09/22 1439 03/10/22 0045 03/10/22 0633 03/10/22 1300 03/10/22 1805 03/11/22 0759 03/12/22 0345  WBC 4.2  --  3.3*  --  4.3  --   --   --  4.4 4.4  NEUTROABS 2.8  --   --   --   --   --   --   --   --   --   HGB 8.4*   < > 8.2*   < > 8.8* 9.1* 9.0* 9.1* 9.2* 9.1*  HCT 26.6*   < > 26.0*   < > 28.1* 28.7* 29.3* 29.1* 30.0* 29.1*  MCV 96.4  --  95.9  --  96.9  --   --   --  96.2 94.5  PLT 177  --  153  --  179  --   --   --  186 193   < > = values in this interval not displayed.   Cardiac Enzymes: No results for input(s): "CKTOTAL", "CKMB", "CKMBINDEX", "TROPONINI" in the last 168 hours. BNP: Invalid input(s): "POCBNP" CBG: Recent Labs  Lab 03/11/22 0745 03/11/22 1152 03/11/22 1649 03/11/22 2029 03/12/22 0749  GLUCAP 172* 159* 155* 219* 140*   D-Dimer No results for input(s): "DDIMER" in the last 72 hours. Hgb A1c No results for input(s): "HGBA1C" in the last 72 hours. Lipid Profile No results for input(s): "CHOL", "HDL", "LDLCALC", "TRIG", "CHOLHDL", "LDLDIRECT" in the last 72 hours. Thyroid function studies No results for  input(s): "TSH", "T4TOTAL", "T3FREE", "THYROIDAB" in the last 72 hours.  Invalid input(s): "FREET3" Anemia work up No results for input(s): "VITAMINB12", "FOLATE", "FERRITIN", "TIBC", "IRON", "RETICCTPCT" in the last 72 hours. Microbiology Recent Results (from the past 240 hour(s))  Blood Culture (routine x 2)     Status: None (Preliminary result)   Collection Time: 03/08/22 12:46 PM   Specimen: BLOOD RIGHT ARM  Result Value Ref Range Status   Specimen Description   Final    BLOOD RIGHT ARM Performed at [facility], [address] [place], [place] [address]    Special Requests   Final    BOTTLES DRAWN AEROBIC AND ANAEROBIC Blood Culture results may not  be optimal due to an excessive volume of blood received in culture bottles Performed at [facility] [address], [place], [place] [address]    Culture   Final    NO GROWTH 4 DAYS Performed at [facility], [address], [place], [place] [address]    Report Status PENDING  Incomplete  Blood Culture (routine x 2)     Status: None (Preliminary result)   Collection Time: 03/09/22  1:03 AM   Specimen: BLOOD  Result Value Ref Range Status   Specimen Description   Final    BLOOD LEFT ANTECUBITAL Performed at [facility] [address], [place], [place] [address]    Special Requests   Final    BOTTLES DRAWN AEROBIC AND ANAEROBIC Blood Culture results may not be optimal due to an excessive volume of blood received in culture bottles Performed at [facility] [address] [place], [place] [address]    Culture   Final    NO GROWTH 3 DAYS Performed at [facility], [place] [address] [place], [place] [address]    Report Status PENDING  Incomplete     Discharge Instructions:   Discharge Instructions     Diet - low sodium heart healthy   Complete by: As directed    Diet Carb Modified   Complete by: As directed    Discharge instructions    Complete by: As directed    Cbc 1 week and closely monitored thereafter while on eliquis Close follow up with cardiology to discuss need for plavix if you have continued issues with your hemoglobin   Increase activity slowly   Complete by: As directed       Allergies as of 03/12/2022       Reactions   Januvia [sitagliptin] Other (See Comments)   Elevated lipase   Metformin And Related Diarrhea        Medication List     TAKE these medications    albuterol 108 (90 Base) MCG/ACT inhaler Commonly known as: VENTOLIN HFA INHALE 2 PUFFS BY MOUTH EVERY 4 HOURS AS NEEDED FOR WHEEZE OR FOR SHORTNESS OF BREATH What changed: See the new instructions.   blood glucose meter kit and supplies Kit Dispense based on patient and insurance preference. Use up to four times daily as directed. (FOR ICD-9 250.00, 250.01).   clopidogrel 75 MG tablet Commonly known as: PLAVIX TAKE 1 TABLET BY MOUTH EVERY DAY   Eliquis DVT/PE Starter Pack Generic drug: Apixaban Starter Pack (71m and 553m Take as directed on package: start with two-33m59mablets twice daily for 7 days. On day 8, switch to one-33mg51mblet twice daily.   apixaban 5 MG Tabs tablet Commonly known as: Eliquis Take 1 tablet (5 mg total) by mouth 2 (two) times daily. Start taking on: April 10, 2022   fish oil-omega-3 fatty acids 1000 MG capsule Take 2 g by mouth daily.   fluticasone 50 MCG/ACT nasal spray Commonly known as: FLONASE PLACE 1 SPRAY INTO BOTH NOSTRILS DAILY AS NEEDED FOR ALLERGIES OR RHINITIS. What changed:  how much to take how to take this when to take this reasons to take this additional instructions   furosemide 20 MG tablet Commonly known as: LASIX TAKE 1 TABLET BY MOUTH EVERY DAY What changed: when to take this   gabapentin 300 MG capsule Commonly known as: NEURONTIN TAKE 1 CAPSULE BY MOUTH EVERY MORNING AND 2 CAPSULES [place]TH [place]  What changed: See the new instructions.    glipiZIDE 10 MG 24 hr tablet Commonly known as: GLUCOTROL XL TAKE 1 TABLET (10 MG TOTAL) BY MOUTH DAILY WITH BREAKFAST. FOR DIABETES. What changed: additional instructions   glucose blood test strip 1 each by Other route as needed for other. Use to check blood sugar up to three times a day-Accu-Chek meter   isosorbide mononitrate 60 MG 24 hr tablet Commonly known as: IMDUR TAKE 1 TABLET BY MOUTH EVERY DAY   Jardiance 25 MG Tabs tablet Generic drug: empagliflozin TAKE 1 TABLET (25 MG TOTAL) BY MOUTH DAILY BEFORE BREAKFAST. FOR DIABETES. What changed: See the new instructions.   lisinopril 2.5 MG tablet Commonly known as: ZESTRIL TAKE 1 TABLET BY MOUTH EVERY DAY What changed: when to take this   nitroGLYCERIN 0.4 MG SL tablet Commonly known as: NITROSTAT Place 1 tablet (0.4 mg total) under the tongue every 5 (five) minutes as needed for chest pain.   pantoprazole 40 MG tablet Commonly known as: PROTONIX Take 1 tablet (40 mg total) by mouth daily. What changed: when to take this   rosuvastatin 20 MG tablet Commonly known as: CRESTOR TAKE 1 TABLET BY MOUTH EVERY DAY IN THE EVENING FOR CHOLESTEROL What changed: See the new instructions.   sertraline 50 MG tablet Commonly known as: ZOLOFT TAKE 1 TABLET (50 MG TOTAL) BY MOUTH DAILY. FOR ANXIETY AND DEPRESSION. What changed: additional instructions   Symbicort 160-4.5 MCG/ACT inhaler Generic drug: budesonide-formoterol INHALE 2 PUFFS INTO THE LUNGS TWICE A DAY What changed: See the new instructions.   tamsulosin 0.4 MG Caps capsule Commonly known as: FLOMAX TAKE 1 CAPSULE (0.4 MG TOTAL) BY MOUTH 2 (TWO) TIMES DAILY. What changed: when to take this   TYLENOL 500 MG tablet Generic drug: acetaminophen Take 500-1,000 mg by mouth every 6 (six) hours as needed for mild pain or headache.   vitamin C 1000 MG tablet Take 2,000 mg by mouth daily.          Time coordinating discharge: 45 min  Signed:  Geradine Girt  DO  [facility] 03/12/2022, 9:54 AM

## 2022-03-13 ENCOUNTER — Telehealth: Payer: Self-pay

## 2022-03-13 ENCOUNTER — Encounter (HOSPITAL_COMMUNITY): Payer: Self-pay | Admitting: Gastroenterology

## 2022-03-13 ENCOUNTER — Other Ambulatory Visit (HOSPITAL_COMMUNITY): Payer: Self-pay

## 2022-03-13 LAB — CULTURE, BLOOD (ROUTINE X 2): Culture: NO GROWTH

## 2022-03-13 NOTE — Chronic Care Management (AMB) (Addendum)
Chronic Care Management Pharmacy Assistant   Name: [name] Cook  MRN: [id number] DOB: [birth year]  Reason for Encounter: Hospital Follow Up  Medications: Outpatient Encounter Medications as of 03/13/2022  Medication Sig   albuterol (VENTOLIN HFA) 108 (90 Base) MCG/ACT inhaler INHALE 2 PUFFS BY MOUTH EVERY 4 HOURS AS NEEDED FOR WHEEZE OR FOR SHORTNESS OF BREATH (Patient taking differently: Inhale 2 puffs into the lungs every 4 (four) hours as needed for wheezing or shortness of breath.)   [START ON 04/10/2022] apixaban (ELIQUIS) 5 MG TABS tablet Take 1 tablet (5 mg total) by mouth 2 (two) times daily.   APIXABAN (ELIQUIS) VTE STARTER PACK (10MG AND 5MG) Take as directed on package: start with two-42m tablets twice daily for 7 days. On day 8, switch to one-556mtablet twice daily.   Ascorbic Acid (VITAMIN C) 1000 MG tablet Take 2,000 mg by mouth daily.   blood glucose meter kit and supplies KIT Dispense based on patient and insurance preference. Use up to four times daily as directed. (FOR ICD-9 250.00, 250.01).   budesonide-formoterol (SYMBICORT) 160-4.5 MCG/ACT inhaler INHALE 2 PUFFS INTO THE LUNGS TWICE A DAY (Patient taking differently: Inhale 2 puffs into the lungs 2 (two) times daily as needed ("for flares").)   clopidogrel (PLAVIX) 75 MG tablet TAKE 1 TABLET BY MOUTH EVERY DAY (Patient taking differently: Take 75 mg by mouth daily.)   fish oil-omega-3 fatty acids 1000 MG capsule Take 2 g by mouth daily.   fluticasone (FLONASE) 50 MCG/ACT nasal spray PLACE 1 SPRAY INTO BOTH NOSTRILS DAILY AS NEEDED FOR ALLERGIES OR RHINITIS. (Patient taking differently: Place 1 spray into both nostrils daily as needed for allergies or rhinitis.)   furosemide (LASIX) 20 MG tablet TAKE 1 TABLET BY MOUTH EVERY DAY (Patient taking differently: Take 20 mg by mouth in the morning.)   gabapentin (NEURONTIN) 300 MG capsule TAKE 1 CAPSULE BY MOUTH EVERY MORNING AND 2 CAPSULES BY MOUTH AT NIGHT FOR NEUROPATHY (Patient  taking differently: Take 300 mg by mouth in the morning and at bedtime.)   glipiZIDE (GLUCOTROL XL) 10 MG 24 hr tablet TAKE 1 TABLET (10 MG TOTAL) BY MOUTH DAILY WITH BREAKFAST. FOR DIABETES. (Patient taking differently: Take 10 mg by mouth daily with breakfast.)   glucose blood test strip 1 each by Other route as needed for other. Use to check blood sugar up to three times a day-Accu-Chek meter   isosorbide mononitrate (IMDUR) 60 MG 24 hr tablet TAKE 1 TABLET BY MOUTH EVERY DAY (Patient taking differently: Take 60 mg by mouth daily.)   JARDIANCE 25 MG TABS tablet TAKE 1 TABLET (25 MG TOTAL) BY MOUTH DAILY BEFORE BREAKFAST. FOR DIABETES. (Patient taking differently: 25 mg daily before breakfast.)   lisinopril (ZESTRIL) 2.5 MG tablet TAKE 1 TABLET BY MOUTH EVERY DAY (Patient taking differently: Take 2.5 mg by mouth in the morning.)   nitroGLYCERIN (NITROSTAT) 0.4 MG SL tablet Place 1 tablet (0.4 mg total) under the tongue every 5 (five) minutes as needed for chest pain.   pantoprazole (PROTONIX) 40 MG tablet Take 1 tablet (40 mg total) by mouth daily.   rosuvastatin (CRESTOR) 20 MG tablet TAKE 1 TABLET BY MOUTH EVERY DAY IN THE EVENING FOR CHOLESTEROL (Patient taking differently: Take 20 mg by mouth at bedtime.)   sertraline (ZOLOFT) 50 MG tablet TAKE 1 TABLET (50 MG TOTAL) BY MOUTH DAILY. FOR ANXIETY AND DEPRESSION. (Patient taking differently: Take 50 mg by mouth daily.)   tamsulosin (FLOMAX) 0.4 MG  CAPS capsule TAKE 1 CAPSULE (0.4 MG TOTAL) BY MOUTH 2 (TWO) TIMES DAILY. (Patient taking differently: Take 0.4 mg by mouth at bedtime.)   TYLENOL 500 MG tablet Take 500-1,000 mg by mouth every 6 (six) hours as needed for mild pain or headache.   No facility-administered encounter medications on file as of 03/13/2022.     Reviewed hospital notes for details of recent visit. Has patient been contacted by Transitions of Care team? No Has patient seen PCP/specialist for hospital follow up (summarize OV if  yes): No  Admitted to the hospital on 03/08/22. Discharge date was 03/12/22.  Discharged from [facility].   Discharge diagnosis (Principal Problem): gastrointestinal bleed Patient was discharged to Home  Brief summary of hospital course:   GIB (gastrointestinal bleeding) Patient with no ulcers on recent GI evaluation. -hgb stable -capsule endoscopy negative Acute pulmonary embolism ([place]) Patient with acute PE and acute left lower extremity deep vein thrombosis involving the posterior tibial veins, superficial vein thrombosis left small saphenous vein.  -echo done - continue anticoagulation with IV heparin, will transition to oral anticoagulation if patient has no active bleeding, follow up with capsule endoscopy and GI recommendations.  -GI ok to start eliquis as capsule negative-Continue anticoagulation with IV heparin, eventually transition to oral anticoagulation if no bleeding complications.   Hold on antiplatelet therapy for now to reduce risk of bleeding   New?Medications Started at [facility] Discharge:?? -Started Eliquis -04/10/22  Medication Changes at Hospital Discharge: -Changed protonix  Medications that remain the same after Hospital Discharge:??  -All other medications will remain the same.    Message left for triage pool to schedule patient for lab work in one week per discharge summary.  Next CCM appt: 03/14/22  Other upcoming appts: PCP appointment on 04/28/2022   GI  03/20/22  Charlene Brooke, PharmD notified and will determine if action is needed. MUKHTAR SHAMS was contacted to remind of upcoming telephone visit with Charlene Brooke on 03/14/22 at 11:00. Patient was reminded to have any blood glucose and blood pressure readings available for review at appointment.   Patient confirmed appointment.  Are you having any problems with your medications? No   Do you have any concerns you like to discuss with the pharmacist? No  CCM referral has been  placed prior to visit?  No   Star Rating Drugs: Medication:  Last Fill: Day Supply Glipizide 74m 03/07/22 90 Jardiance 230m9/7/23  90 Lisinopril 2.88m17m0/18/23 90 Rosuvastatin 94m62m22/23 90  [place]  [phone] Pharmacist addendum: Patient with 2 admission in last 30 days for GI bleed, most recent with acute PE. CCM appt 11/28 to discuss.  LindCharlene BrookearmD, BCACP 03/13/22 4:44 PM

## 2022-03-13 NOTE — Telephone Encounter (Signed)
He will actually need a hospital follow up with me. We will obtain labs at that time.

## 2022-03-13 NOTE — Progress Notes (Unsigned)
Chronic Care Management Pharmacy Note  03/14/2022 Name:  [name] Cook MRN:  [id number] DOB:  1950/07/21  Summary: CCM F/U visit -Reviewed recent hospital notes for GI bleeding/acute PE. Pt affirms compliance with new Eliquis (currently on 10 mg BID, will transition to 5 mg BID according to package instructions after 7 days); he is also still taking clopidogrel, though discharge notes say to hold antiplatelets until follow up with cardiology -Reviewed role of clopidogrel in s/o of anticoagulation and recent GI bleed - given stent was > 12 months ago it would be reasonable to discontinue clopidogrel and continue Eliquis alone or with aspirin, advised to discuss with cardiology at appt 11/29 -Reviewed role of fish oil. TRIG are slightly elevated (166 on 10/2021) but given antiplatelet effects of fish oil risks and recent GI bleeding risks likely outweigh benefits  Recommendations/Changes made from today's visit: -Consider d/c clopidogrel - follow up with cardiology as scheduled. Routing note to cardiology -Stop fish oil  Plan: -Parchment will call patient 1 month for medication update -Pharmacist follow up televisit scheduled for 2 months -Cardiology appt 03/15/22; Afib clinic 03/20/22 -PCP appt 03/22/22    Subjective: [name] Cook is an 71 y.o. year old male who is a primary patient of [name], NP.  The CCM team was consulted for assistance with disease management and care coordination needs.    Engaged with patient by telephone for follow up visit in response to provider referral for pharmacy case management and/or care coordination services.   Consent to Services:  The patient was given information about Chronic Care Management services, agreed to services, and gave verbal consent prior to initiation of services.  Please see initial visit note for detailed documentation.   Patient Care Team: [name], NP as PCP - General (Internal Medicine) [name], MD as PCP - Electrophysiology (Cardiology) Jerline Pain, MD as PCP - Cardiology (Cardiology) Rothbart, Cristopher Estimable, MD (Inactive) (Cardiology) Noreene Filbert, MD as Radiation Oncologist (Radiation Oncology) Charlton Haws, [facility] as Pharmacist (Pharmacist)  Recent office visits: 08/19/21 NP Romilda Garret OV: acute cough - rx'd cetirizine, benzonate. Covid negaive. 07/28/21 NP Alma Friendly OV: f/u DM, depression. Increased duloxetine to 90 mg/day. Increase gabapentin to 2 cap at night. Work on diet changes.  06/23/21 NP Alma Friendly OV: f/u DM. Increase Jardiance to 25 mg daily. F/u with GI regarding diarrhea. Lipase elevated, ordered CT scan - negative for pancreatitis.  06/08/21 Phone note - BG has elevated to 212. Started glipizide 10 mg. 05/27/21 NP Alma Friendly OV: c/o diarrhea. Stop metformin. Complete stool sample kit. Referred for colonoscopy. Lipase elevated, concern for pancreatitis, advised to stop Januvia as well.  04/08/21 NP Alma Friendly OV: f/u DM, depression. Pt did not increase metformin as instructed last visit. A1c 7.0. No med changes. F/u 6 months.  03/23/21 NP Alma Friendly OV: f/u fatigue, depression. Increase duloxetine to 60 mg daily. Increase metformin to 2 tab AM or PM.  03/07/21 NP Karl Ito OV: acute bronchitis. Rx'd Augmentin and prednisone. 03/02/21 NP Karl Ito VV: c/o acute cough. Negative flu and covid tests. Rx'd guafenesin-codeine syrup.  02/02/21 NP Alma Friendly OV: c/o diarrhea. Reduce metformin to 500 mg BID.   12/30/20 NP Alma Friendly OV: f/u DM. Stop citalopram and start duloxetine 30 mg daily. Change metformin to ER 500 mg and increase to 2 tab BID.   11/22/20 - Family Medicine - Patient presented for follow up abdomen pain. Improving on  current regimen. Advised to finish taking the antibiotic as prescribed earlier. Follow up if symptoms do not improve.  Recent consult visits:  06/15/21 PA Zara Council (Urology): f/u ED, prostate  cancer. Scheduled ICI titration. 06/02/21 Dr Vicente Males (GI): c/o diarrhea. Resolved after stopping Januvia and metformin. Advised to stop diclofenac and diet soda. No further w/u for diarrhea as it has resolved. Scheduled surveillance colonoscopy. 06/02/21 Dr [name] (cardiology): f/u Afib. Stop metoprolol. 04/29/21 NP Eric Form (Pulmonary): hx tobacco use 02/15/21 PA Zara Council (Urology): f/u ED, prostate cancer. Not a candidate for PDE-5 due to nitrate use. S/p EBRT and Lupron for Prostate Ca 2020. 11/23/20 - Cardiology - Patient presented for routine electrophysiology follow up. No medication changes. 06/15/20 - Urology - Patient presented for follow up prostate cancer. Currently on Flomax twice daily, may titrate off if his symptoms are well controlled.  Hospital visits: 03/08/22 - 03/12/22 Admission ([facility]): GI Bleed, Acute PE/DVT. Transition to Eliquis. Hold Plavix.  02/27/22 - 03/03/22 Admission ([facility]): Upper GI bleed, on warfarin and Plavix. HGB 9.7 on admission down from 15. Received 4 units PRBC. Changed to Pantoprazole 40 mg BID. Resumed Plavix but hold Coumadin until clinic f/u. HGB 8.1 at discharge.   Objective:  Lab Results  Component Value Date   CREATININE 1.18 03/12/2022   BUN 18 03/12/2022   GFR 71.64 10/26/2021   GFRNONAA >60 03/12/2022   GFRAA 72 06/23/2019   NA 140 03/12/2022   K 4.1 03/12/2022   CALCIUM 8.6 (L) 03/12/2022   CO2 23 03/12/2022   GLUCOSE 129 (H) 03/12/2022    Lab Results  Component Value Date/Time   HGBA1C 6.4 (H) 03/08/2022 06:00 PM   HGBA1C 7.5 (H) 10/26/2021 12:43 PM   HGBA1C 6.4 (H) 11/27/2016 11:33 AM   HGBA1C 7.1 (H) 08/21/2016 10:25 AM   GFR 71.64 10/26/2021 12:43 PM   GFR 68.68 07/01/2021 08:02 AM   MICROALBUR <0.7 09/21/2017 01:06 PM   MICROALBUR <0.2 11/27/2016 11:33 AM    Last diabetic Eye exam:  Lab Results  Component Value Date/Time   HMDIABEYEEXA No Retinopathy 08/28/2020 12:00 AM    Last diabetic Foot exam: 09/2019 PCP  Lab  Results  Component Value Date   CHOL 132 10/26/2021   HDL 50.10 10/26/2021   LDLCALC 49 10/26/2021   TRIG 166.0 (H) 10/26/2021   CHOLHDL 3 10/26/2021       Latest Ref Rng & Units 03/09/2022    1:03 AM 03/08/2022   12:33 PM 02/28/2022    1:09 AM  Hepatic Function  Total Protein 6.5 - 8.1 g/dL 5.4  5.9  4.3   Albumin 3.5 - 5.0 g/dL 3.3  3.5  2.7   AST 15 - 41 U/L _0 ALT 0 - 44 U/L _1 Alk Phosphatase 38 - 126 U/L 40  46  26   Total Bilirubin 0.3 - 1.2 mg/dL 0.6  0.7  0.6     Lab Results  Component Value Date/Time   TSH 2.030 04/15/2019 10:05 AM   TSH 1.93 10/27/2015 10:54 AM   FREET4 1.38 03/14/2013 11:12 AM   FREET4 1.11 05/10/2010 10:02 AM       Latest Ref Rng & Units 03/12/2022    3:45 AM 03/11/2022    7:59 AM 03/10/2022    6:05 PM  CBC  WBC 4.0 - 10.5 K/uL 4.4  4.4    Hemoglobin 13.0 - 17.0 g/dL 9.1  9.2  9.1  Hematocrit 39.0 - 52.0 % 29.1  30.0  29.1   Platelets 150 - 400 K/uL 193  186      Lab Results  Component Value Date/Time   VD25OH 32 10/27/2015 10:54 AM    Clinical ASCVD: Yes  The ASCVD Risk score (Arnett DK, et al., 2019) failed to calculate for the following reasons:   The patient has a prior MI or stroke diagnosis       10/07/2021   11:54 AM 04/08/2021    8:53 AM 02/02/2021   11:52 AM  Depression screen PHQ 2/9  Decreased Interest 0 0 0  Down, Depressed, Hopeless 0 0 0  PHQ - 2 Score 0 0 0  Altered sleeping 0 0 0  Tired, decreased energy 0 2 0  Change in appetite 0 0 0  Feeling bad or failure about yourself  0 0 0  Trouble concentrating 0 0 0  Moving slowly or fidgety/restless 0 0 0  Suicidal thoughts 0 0 0  PHQ-9 Score 0 2 0  Difficult doing work/chores Not difficult at all Not difficult at all Not difficult at all     Social History   Tobacco Use  Smoking Status Former   Packs/day: 1.00   Years: 42.00   Total pack years: 42.00   Types: Cigarettes   Quit date: 12/30/2013   Years since quitting: 8.2   Smokeless Tobacco Never   BP Readings from Last 3 Encounters:  03/12/22 (!) 110/59  03/03/22 118/72  12/20/21 105/66   Pulse Readings from Last 3 Encounters:  03/12/22 69  03/03/22 70  12/20/21 80   Wt Readings from Last 3 Encounters:  03/09/22 217 lb 6 oz (98.6 kg)  03/03/22 218 lb 4.1 oz (99 kg)  12/20/21 213 lb (96.6 kg)   BMI Readings from Last 3 Encounters:  03/09/22 30.32 kg/m  03/03/22 30.44 kg/m  12/20/21 29.71 kg/m    Assessment/Interventions: Review of patient past medical history, allergies, medications, health status, including review of consultants reports, laboratory and other test data, was performed as part of comprehensive evaluation and provision of chronic care management services.   SDOH:  (Social Determinants of Health) assessments and interventions performed: No- done Sept 2023 SDOH Interventions    Flowsheet Row Telephone from 01/11/2022 in [place] from 10/07/2021 in [place] at [facility] Visit from 04/08/2021 in [place] at [place] Management from 12/07/2020 in [place] at [place] from 10/06/2020 in [place] at [facility] Visit from 02/06/2020 in [place] at [employer] Interventions Intervention Not Indicated Intervention Not Indicated -- -- -- --  Housing Interventions Intervention Not Indicated Intervention Not Indicated -- -- -- --  Transportation Interventions Intervention Not Indicated Intervention Not Indicated -- -- -- --  Depression Interventions/Treatment  -- -- Counseling -- PHQ2-9 Score <4 Follow-up Not Indicated Counseling  Financial Strain Interventions -- Intervention Not Indicated -- Intervention Not Indicated -- --  Physical Activity Interventions -- Intervention Not Indicated -- -- -- --  Stress Interventions -- Intervention  Not Indicated -- -- -- --  Social Connections Interventions -- Intervention Not Indicated -- -- -- --       [place]: No Food Insecurity (03/09/2022)  Housing: [place]  (03/09/2022)  Transportation Needs: No Transportation Needs (03/09/2022)  Utilities: Not At Risk (03/09/2022)  Alcohol Screen:  Low Risk  (10/07/2021)  Depression (PHQ2-9): Low Risk  (10/07/2021)  Financial Resource Strain: Low Risk  (10/07/2021)  Physical Activity: Unknown (10/07/2021)  Social Connections: Socially Isolated (10/07/2021)  Stress: No Stress Concern Present (10/07/2021)  Tobacco Use: Medium Risk (03/13/2022)    CCM Care Plan  Allergies  Allergen Reactions   Januvia [Sitagliptin] Other (See Comments)    Elevated lipase   Metformin And Related Diarrhea    Medications Reviewed Today     Reviewed by Charlton Haws, [facility] (Pharmacist) on 03/14/22 at 1117  Med List Status: <None>   Medication Order Taking? Sig Documenting Provider Last Dose Status Informant  albuterol (VENTOLIN HFA) 108 (90 Base) MCG/ACT inhaler [id number] Yes INHALE 2 PUFFS BY MOUTH EVERY 4 HOURS AS NEEDED FOR WHEEZE OR FOR SHORTNESS OF BREATH [name], NP Taking Active Self  apixaban (ELIQUIS) 5 MG TABS tablet [id number] Yes Take 1 tablet (5 mg total) by mouth 2 (two) times daily. Geradine Girt, DO Taking Active   APIXABAN Arne [place]) VTE STARTER PACK (10MG AND 5MG) [id number] Yes Take as directed on package: start with two-22m tablets twice daily for 7 days. On day 8, switch to one-566mtablet twice daily. VaGeradine GirtDO Taking Active   Ascorbic Acid (VITAMIN C) 1000 MG tablet [id number] Take 2,000 mg by mouth daily. [provider] Taking Active Self  blood glucose meter kit and supplies KIT [id number] Dispense based on patient and insurance preference. Use up to four times daily as directed. (FOR ICD-9 250.00, 250.01). ClPleas KochNP Taking Active Self  budesonide-formoterol  ([facility]160-4.5 MCG/ACT inhaler [id number] INHALE 2 PUFFS INTO THE LUNGS TWICE A DAY ClPleas KochNP Taking Active Self  clopidogrel (PLAVIX) 75 MG tablet [id number] TAKE 1 TABLET BY MOUTH EVERY DAY TaEvans LanceMD Taking Active Self           Med Note (FCharlton Haws Tue Mar 14, 2022 11:16 AM) Should be on hold until f/u with cardiology 11/29  fish oil-omega-3 fatty acids 1000 MG capsule [id number] Take 2 g by mouth daily. [provider] Taking Active Self  fluticasone (FLONASE) 50 MCG/ACT nasal spray [id number] PLACE 1 SPRAY INTO BOTH NOSTRILS DAILY AS NEEDED FOR ALLERGIES OR RHINITIS. ClPleas KochNP Taking Active Self  furosemide (LASIX) 20 MG tablet [id number] TAKE 1 TABLET BY MOUTH EVERY DAY  Patient taking differently: Take 20 mg by mouth in the morning.   TaEvans LanceMD Taking Active Self  gabapentin (NEURONTIN) 300 MG capsule [id number] TAKE 1 CAPSULE BY MOUTH EVERY MORNING AND 2 CAPSULES BY MOUTH AT NIGHT FOR NEUROPATHY  Patient taking differently: Take 300 mg by mouth in the morning and at bedtime.   ClPleas KochNP Taking Active Self  glipiZIDE (GLUCOTROL XL) 10 MG 24 hr tablet [id number] TAKE 1 TABLET (10 MG TOTAL) BY MOUTH DAILY WITH BREAKFAST. FOR DIABETES. ClPleas KochNP Taking Active Self  glucose blood test strip [id number] 1 each by Other route as needed for other. Use to check blood sugar up to three times a day-Accu-Chek meter [provider] Taking Active Self  isosorbide mononitrate (IMDUR) 60 MG 24 hr tablet [id number] TAKE 1 TABLET BY MOUTH EVERY DAY  Patient taking differently: Take 60 mg by mouth daily.   TaEvans LanceMD Taking Active Self  JARDIANCE 25 MG TABS tablet [id number] TAKE 1 TABLET (25  MG TOTAL) BY MOUTH DAILY BEFORE BREAKFAST. FOR DIABETES. [name], NP Taking Active Self  lisinopril (ZESTRIL) 2.5 MG tablet [id number] Yes TAKE 1 TABLET BY MOUTH EVERY  DAY  Patient taking differently: Take 2.5 mg by mouth in the morning.   [name], MD Taking Active Self  nitroGLYCERIN (NITROSTAT) 0.4 MG SL tablet [id number] Yes Place 1 tablet (0.4 mg total) under the tongue every 5 (five) minutes as needed for chest pain. [name], NP Taking Active Self  pantoprazole (PROTONIX) 40 MG tablet [id number] Yes Take 1 tablet (40 mg total) by mouth daily. Geradine Girt, DO Taking Active   rosuvastatin (CRESTOR) 20 MG tablet [id number] Yes TAKE 1 TABLET BY MOUTH EVERY DAY IN THE EVENING FOR CHOLESTEROL [name], NP Taking Active Self  sertraline (ZOLOFT) 50 MG tablet [id number] Yes TAKE 1 TABLET (50 MG TOTAL) BY MOUTH DAILY. FOR ANXIETY AND DEPRESSION. [name], NP Taking Active Self  tamsulosin (FLOMAX) 0.4 MG CAPS capsule [id number] Yes TAKE 1 CAPSULE (0.4 MG TOTAL) BY MOUTH 2 (TWO) TIMES DAILY. Noreene Filbert, MD Taking Active Self  TYLENOL 500 MG tablet [id number] Yes Take 500-1,000 mg by mouth every 6 (six) hours as needed for mild pain or headache. [provider] Taking Active Self            Patient Active Problem List   Diagnosis Date Noted   Class 1 obesity 03/09/2022   Pulmonary embolism ([place]) 03/09/2022   GIB (gastrointestinal bleeding) 03/08/2022   Symptomatic anemia 03/08/2022   Acute pulmonary embolism ([place]) 03/08/2022   Anxiety 03/08/2022   AKI (acute kidney injury) ([place]) 03/08/2022   Upper GI bleeding 02/27/2022   Diaphoresis 10/26/2021   Shortness of breath 08/19/2021   Neuropathy due to secondary diabetes ([place]) 07/28/2021   History of diverticulitis 02/06/2020   Preventative health care 10/09/2019   Polyp of colon    History of prostate cancer 06/10/2018   OSA (obstructive sleep apnea) 10/01/2017   Osteoarthritis 06/26/2017   Insomnia 10/01/2014   Depression 03/09/2014   Coronary artery disease 11/06/2013   Long term current use of anticoagulant therapy 06/19/2013   Pacemaker 04/30/2013    Acute myocardial infarction, subendocardial infarction, initial episode of care ([place]) 03/15/2013   Abnormal LFTs 05/02/2012   Chronic kidney disease, 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/26/2011   Chronic obstructive pulmonary disease ([place]) 04/20/2011   Atrial fibrillation    Hypertension    Hyperlipidemia    Type 2 diabetes mellitus with hyperglycemia ([place])    Atrial flutter ([place])    GASTROESOPHAGEAL REFLUX DISEASE 05/16/2010    Immunization History  Administered Date(s) Administered   Fluad Quad(high Dose 65+) 02/02/2021, 12/23/2021   Influenza, High Dose Seasonal PF 02/20/2018, 02/03/2019   Influenza,inj,Quad PF,6+ Mos 02/05/2014, 01/04/2015, 04/20/2016   Influenza-Unspecified 01/05/2011, 01/15/2013, 01/30/2020   PFIZER(Purple Top)SARS-COV-2 Vaccination 09/17/2019, 10/08/2019   Pneumococcal Conjugate-13 09/21/2017   Pneumococcal Polysaccharide-23 01/05/2011   Tdap 01/05/2011    Conditions to be addressed/monitored:  Hypertension, Hyperlipidemia, Diabetes, Atrial Fibrillation, COPD, and Depression  Care Plan : [place]  Updates made by Charlton Haws, [place] since 03/14/2022 12:00 AM     Problem: Hypertension, Hyperlipidemia, Diabetes, Atrial Fibrillation, COPD, and Depression   Priority: High  Onset Date: 12/07/2020     Long-Range Goal: Disease mgmt   Start Date: 06/10/2021  Expected End Date: 06/10/2022  This Visit's Progress: On track  Recent Progress: On track  Priority: High  Note:   Current Barriers:  Recent GI bleeding on Eliquis and Plavix  Pharmacist Clinical Goal(s):  Patient will contact provider office for questions/concerns as evidenced notation of same in electronic health record through collaboration with PharmD and provider.   Interventions: 1:1 collaboration with [name], NP regarding development and update of comprehensive plan of care as evidenced by provider attestation and co-signature Inter-disciplinary care team collaboration (see  longitudinal plan of care) Comprehensive medication review performed; medication list updated in electronic medical record  Hypertension / Heart Failure (BP goal <130/80) -Controlled - BP at goal in recent OV -Hx OSA, unable to tolerate CPAP -LVEF 55-60% (11/25/20); mild Left ventricular hypertrophy -Current home BP readings: not checking currently -Current treatment: Lisinopril 2.5 mg daily -Appropriate, Effective, Safe, Accessible Furosemide 20 mg daily -Appropriate, Effective, Safe, Accessible Isosorbide Mononitrate 60 mg daily -Appropriate, Effective, Safe, Accessible -Medications previously tried: none reported  -Educated on BP goals and benefits of medications for prevention of heart attack, stroke and kidney damage; -Counseled to monitor BP at home a few days a week -Recommended to continue current medication   Atrial Fibrillation (Goal: prevent stroke and major bleeding) -Controlled - per patient report no symptoms, INR stable -Followed by cardiology -CHADSVASC: 5 -Current treatment: Eliquis 10 mg BID, start 5 mg BID 03/18/22 - Appropriate, Effective, Safe, Accessible -Medications previously tried: metoprolol; warfarin (GI bleed x 2) -Counseled on importance of adherence to anticoagulant exactly as prescribed; avoidance of NSAIDs due to increased bleeding risk with anticoagulants; -Recommended to continue current medication  Acute PE/DVT (Goal: prevent recurrence) -Stable - pt affirms compliance with Eliquis and has stopped warfarin; he has starter pack for Eliquis and is still taking 10 mg BID in first 7 days, plans to transition to 5 mg BID 12/2 per package instructions; -GI bleed 11/13 on warfarin/Plavix > held warfarin. Readmitted 11/22 with GI bleed and PE > start Eliquis, hold Plavix. -Current treatment  Eliquis 10 mg BID, start 5 mg BID 03/18/22 -Appropriate, Effective, Safe, Accessible -Medications previously tried: warfarin -Discussed Eliquis cost - he reports copay  was $0; he has Medicare/Medicaid so cost should not be an issue going forward -Of note patient is still taking clopidogrel - see HLD/CAD discussion below -Recommended to continue current medication  Hyperlipidemia/CAD: (LDL goal < 70) -Controlled - LDL 49 (10/2021) at goal; TRIG 166 (10/2021) slightly elevated -Hx CAD, stent Feb 2020, followed by cardiology -Pt denies any chest pain or abnormal symptoms. Affirms daily adherence.  -Current treatment: Rosuvastatin 20 mg daily - Appropriate, Effective, Safe, Accessible Clopidogrel 75 mg daily- Appropriate, Effective, Query Safe Isosorbide Mononitrate 60 mg daily -Appropriate, Effective, Safe, Accessible Nitroglycerin - PRN  -Appropriate, Effective, Safe, Accessible Fish Oil 1000 mg daily -Appropriate, Effective, Safe, Accessible -Medications previously tried: none reported  -Educated on Cholesterol goals;  -Reviewed role of fish oil and antiplatelet properties, given minimally elevated TRIG risks likely > benefits in s/o of recent GI bleeding; advised to stop fish oil -Reviewed role of clopidogrel in s/o of anticoagulation and recent GI bleed - given stent was > 12 months ago it would be reasonable to discontinue clopidogrel and continue Eliquis alone or with aspirin, advised to discuss with cardiology at appt 11/29  Diabetes (A1c goal <7%) -Not ideally controlled - A1c 7.0 (04/2021) at goal; pt was recently taken off metformin and Januvia due to diarrhea and elevated lipase, respectively -Current home glucose readings - checks fasting 3 days per week fasting glucose: <150 -Current medications: Jardiance 25 mg daily - Appropriate, Effective, Safe, Accessible Glipizide XL  10 mg daily - Appropriate, Effective, Safe, Accessible -Medications previously tried: metformin (diarrhea), Januvia (elevated lipase) -Reviewed benefits of medications and fasting BG goal < 130 -Recommend to continue current medication  COPD/OSA (Goal: control symptoms and  prevent exacerbations) -Controlled -  per patient report, no issues with breathing/wheezing -No active tobacco use. Quit 2013. Low dose CT 05/06/21 normal.  -No PFTs found. No CAT score documented. -Current treatment  Symbicort 160-4.5 mcg/act - 2 puff BID (takes PRN) - Appropriate, Effective, Safe, Accessible Albuterol PRN - Appropriate, Effective, Safe, Accessible -Medications previously tried: none reported -Exacerbations requiring treatment in last 6 months: none -Patient denies consistent use of maintenance inhaler. He uses Symbicort PRN -Frequency of rescue inhaler use: ~ 1-2 x/week -Counseled on Differences between maintenance and rescue inhalers -Recommended to continue current medication for now.  Depression/Insomnia (Goal: Improve mood, sleep) -Controlled - pt reports improvement in mood since duloxetine was increased -Caffeine: 2 cups coffee, drinks coke zero - 2 cans/day. No alcohol or tobacco use -PHQ9= 2 (03/2021) - no/minimal depression -Current treatment: Duloxetine 60 mg daily - Appropriate, Effective, Safe, Accessible Duloxetine 30 mg daily -Appropriate, Effective, Safe, Accessible -Medications previously tried/failed: Lexapro 10 mg, Bupropion 150 mg BID, citalopram -Recommended to continue current medication  Health Maintenance -Vaccine gaps: Prevnar20 or PPSV23, TDAP -Hx prostate cancer, x/p radiation and Lupron early 2020  Patient Goals/Self-Care Activities Patient will:  - take medications as prescribed check glucose 3 days per week, document, and provide at future appointments check blood pressure 3 days per week, document, and provide at future appointments       Medication Assistance: None required.  Patient affirms current coverage meets needs. (Medicare/Medicaid)  Compliance/Adherence/Medication fill history: Care Gaps: None  Star-Rating Drugs: Rosuvastatin - PDC 100% Jardiance - PDC 100% Lisinopril -PDC 99%  Medication Access: Within the  past 30 days, how often has patient missed a dose of medication? 0 Is a pillbox or other method used to improve adherence? Yes  Factors that may affect medication adherence? no barriers identified Are meds synced by current pharmacy? No  Are meds delivered by current pharmacy? No  Does patient experience delays in picking up medications due to transportation concerns? No   [facility] Services Reviewed: Is patient disadvantaged to use [facility]?: No  Current Rx insurance plan: U[employer] Dual Complete Name and location of Current pharmacy:  [id number] [place]Phone: [phone]: [phone] [facility] services reviewed with patient today?: No  Patient requests to transfer care to [facility]?: No  Reason patient declined to change pharmacies: Hesitance/Possibly interested in future   Care Plan and Follow Up Patient Decision:  Patient agrees to Care Plan and Follow-up.  Follow Up Plan: Telephone follow up appointment with care management team member scheduled for: 2 months  LCharlene Brooke PharmD, BSwisher Memorial HospitalClinical Pharmacist LMatherPrimary Care at SGarden State Endoscopy And Surgery [phone]

## 2022-03-13 NOTE — Telephone Encounter (Signed)
-----   Message from [place], [place] sent at 03/13/2022  9:23 AM EST ----- Patient just discharged from hospital  and needs to make appointment for CBC 1 week per discharge note, PCP is Allie Bossier,  I am unable to schedule those type of appointments, thanks for your help.  Charlene Brooke, CPP notified  Avel Sensor, [place]  [phone]

## 2022-03-13 NOTE — Progress Notes (Unsigned)
Cardiology Office Note:    Date:  03/15/2022   ID:  [name] Cook, DOB Apr 13, 1951, MRN [id number]  PCP:  [name], NP  [facility] Cardiologist:  Candee Furbish, MD Electrophysiologist:  [name], MD     Referring MD: [name], NP   Chief Complaint:  No chief complaint on file.     History of Present Illness:   [name] Cook is a 71 y.o. male with  history of CAD cath 2020 Patent mRCA stent, 100% D1, 99% oastial D1 to 1st Diag lession, mRCA 30%, pRCA 40% medical therapy with long term plavix recommended.  He also has PAF, Pacemaker,   Patient last saw Dr. Marlou Porch 08/2021 with SOB.  He had a low risk NST 09/2021  Patient discharged 03/03/22 after Acute GI bleed on Plavix and coumadin-received 4 untis PRBC's, endo erosive gastropathy. Plavix resumed and coumadin held until clinic f/u. Patient went back to ED 03/08/22 and discharged 03/12/22 with Acute PE and started on eliquis.  They want to know if plavix can be stopped while on eliquis for his PE.  Patient comes in for f/u. Still very weak, dizzy and short of breath when he gets up. Worried he has another heart blockage. No chest pain.  Had trouble getting in here. Hbg 9.1 03/12/22.      Past Medical History:  Diagnosis Date   Acute bronchitis with COPD ([place]) 03/07/2021   Arthritis    "knees and lower back" (03/14/2013)   Atrial flutter ([place])    radiofrequency ablation in 2001   CAD (coronary artery disease)    a. Nonobstructive. Cardiac cath in 2001-50% mid RI, normal LM, LAD, RCA b. cath 10/16/2014 95% mid RCA treated with DES, 99% ost D1 medical management due to small aneurysmal segment   Chronic anticoagulation    chronic Coumadin anticoagulation   Chronic obstructive pulmonary disease ([place]) 04/20/2011   Diabetes mellitus, type 2 (HCC)    Elevated lipase 06/23/2021   GERD (gastroesophageal reflux disease)    Hyperlipidemia    Hypertension    with hypertensive heart disease   Left knee pain  10/25/2017   medial   Obesity    Persistent atrial fibrillation ([place])    recurrent atrial flutter since 2001 s/p DCCVs, multiple failed AADs, h/o tachy-mediated cardiomyopathy   Shortness of breath    "can come on at any time" (03/14/2013)   Sleep apnea    "dx'd; couldn't wear the mask" (03/14/2013)   Tobacco abuse    Current Medications: Current Meds  Medication Sig   albuterol (VENTOLIN HFA) 108 (90 Base) MCG/ACT inhaler INHALE 2 PUFFS BY MOUTH EVERY 4 HOURS AS NEEDED FOR WHEEZE OR FOR SHORTNESS OF BREATH   [START ON 04/10/2022] apixaban (ELIQUIS) 5 MG TABS tablet Take 1 tablet (5 mg total) by mouth 2 (two) times daily.   APIXABAN (ELIQUIS) VTE STARTER PACK (10MG AND 5MG) Take as directed on package: start with two-4m tablets twice daily for 7 days. On day 8, switch to one-570mtablet twice daily.   Ascorbic Acid (VITAMIN C) 1000 MG tablet Take 2,000 mg by mouth daily.   blood glucose meter kit and supplies KIT Dispense based on patient and insurance preference. Use up to four times daily as directed. (FOR ICD-9 250.00, 250.01).   budesonide-formoterol (SYMBICORT) 160-4.5 MCG/ACT inhaler INHALE 2 PUFFS INTO THE LUNGS TWICE A DAY   fish oil-omega-3 fatty acids 1000 MG capsule Take 2 g by mouth daily.   fluticasone (  FLONASE) 50 MCG/ACT nasal spray PLACE 1 SPRAY INTO BOTH NOSTRILS DAILY AS NEEDED FOR ALLERGIES OR RHINITIS.   gabapentin (NEURONTIN) 300 MG capsule TAKE 1 CAPSULE BY MOUTH EVERY MORNING AND 2 CAPSULES BY MOUTH AT NIGHT FOR NEUROPATHY   glipiZIDE (GLUCOTROL XL) 10 MG 24 hr tablet TAKE 1 TABLET (10 MG TOTAL) BY MOUTH DAILY WITH BREAKFAST. FOR DIABETES.   glucose blood test strip 1 each by Other route as needed for other. Use to check blood sugar up to three times a day-Accu-Chek meter   JARDIANCE 25 MG TABS tablet TAKE 1 TABLET (25 MG TOTAL) BY MOUTH DAILY BEFORE BREAKFAST. FOR DIABETES.   lisinopril (ZESTRIL) 2.5 MG tablet TAKE 1 TABLET BY MOUTH EVERY DAY   nitroGLYCERIN  (NITROSTAT) 0.4 MG SL tablet Place 1 tablet (0.4 mg total) under the tongue every 5 (five) minutes as needed for chest pain.   pantoprazole (PROTONIX) 40 MG tablet Take 1 tablet (40 mg total) by mouth daily.   rosuvastatin (CRESTOR) 20 MG tablet TAKE 1 TABLET BY MOUTH EVERY DAY IN THE EVENING FOR CHOLESTEROL   sertraline (ZOLOFT) 50 MG tablet TAKE 1 TABLET (50 MG TOTAL) BY MOUTH DAILY. FOR ANXIETY AND DEPRESSION.   tamsulosin (FLOMAX) 0.4 MG CAPS capsule TAKE 1 CAPSULE (0.4 MG TOTAL) BY MOUTH 2 (TWO) TIMES DAILY.   TYLENOL 500 MG tablet Take 500-1,000 mg by mouth every 6 (six) hours as needed for mild pain or headache.   [DISCONTINUED] furosemide (LASIX) 20 MG tablet TAKE 1 TABLET BY MOUTH EVERY DAY   [DISCONTINUED] isosorbide mononitrate (IMDUR) 60 MG 24 hr tablet TAKE 1 TABLET BY MOUTH EVERY DAY    Allergies:   Januvia [sitagliptin] and Metformin and related   Social History   Tobacco Use   Smoking status: Former    Packs/day: 1.00    Years: 42.00    Total pack years: 42.00    Types: Cigarettes    Quit date: 12/30/2013    Years since quitting: 8.2   Smokeless tobacco: Never  Vaping Use   Vaping Use: Never used  Substance Use Topics   Alcohol use: Not Currently    Comment: 03/14/2013 "stopped drinking back in 2002; never had problem w/it"   Drug use: No    Family Hx: The patient's family history includes Alzheimer's disease in his mother; Osteoporosis in his mother.  ROS     Physical Exam:    VS:  BP 110/66   Pulse 87   Ht _0  (1.803 m)   Wt 212 lb (96.2 kg)   SpO2 97%   BMI 29.57 kg/m     Wt Readings from Last 3 Encounters:  03/15/22 212 lb (96.2 kg)  03/09/22 217 lb 6 oz (98.6 kg)  03/03/22 218 lb 4.1 oz (99 kg)    Physical Exam  GEN: Pale, Well nourished, well developed, in no acute distress  Neck: no JVD, carotid bruits, or masses Cardiac:RRR; no murmurs, rubs, or gallops  Respiratory:  clear to auscultation bilaterally, normal work of breathing GI:  soft, nontender, nondistended, + BS Ext: without cyanosis, clubbing, or edema, Good distal pulses bilaterally Neuro:  Alert and Oriented x 3,  Psych: euthymic mood, full affect        EKGs/Labs/Other Test Reviewed:    EKG:  EKG is  not ordered today.   Recent Labs: 03/08/2022: B Natriuretic Peptide 85.3; Magnesium 2.1 03/09/2022: ALT 16 03/12/2022: BUN 18; Creatinine, Ser 1.18; Hemoglobin 9.1; Platelets 193; Potassium 4.1; Sodium 140  Recent Lipid Panel Recent Labs    10/26/21 1243  CHOL 132  TRIG 166.0*  HDL 50.10  VLDL 33.2  LDLCALC 49     Prior CV Studies:   Echo 02/2022 IMPRESSIONS Left ventricular ejection fraction, by estimation, is 50%. The left ventricle has low normal function. Left ventricular endocardial border not optimally defined to evaluate regional wall motion. There is mild concentric left ventricular hypertrophy. Left ventricular diastolic parameters are indeterminate. 1. Right ventricular systolic function is normal. The right ventricular size is normal. There is normal pulmonary artery systolic pressure. 2. 3. Left atrial size was mildly dilated. The mitral valve is grossly normal. No evidence of mitral valve regurgitation. No evidence of mitral stenosis. 4. The aortic valve is tricuspid. Aortic valve regurgitation is not visualized. Aortic valve sclerosis is present, with no evidence of aortic valve stenosis. 5. The inferior vena cava is dilated in size with >50% respiratory variability, suggesting right atrial pressure of 8 mmHg. 6. Comparison(s): Apex is not as well defined in this study. FINDINGS Left Ventricle: Left ventricular ejection fraction, by estimation, is 50%. The left ventricle has mildly decreased function. Left ventricular endocardial border not optimally defined to evaluate regional wall motion. The left ventricular internal cavity size was normal in size. There is mild concentric left ventricular hypertrophy. Left  ventricular diastolic parameters are indeterminate.   Risk Assessment/Calculations/Metrics:    CHA2DS2-VASc Score = 4   This indicates a 4.8% annual risk of stroke. The patient's score is based upon: CHF History: 0 HTN History: 1 Diabetes History: 1 Stroke History: 0 Vascular Disease History: 1 Age Score: 1 Gender Score: 0             ASSESSMENT & PLAN:   No problem-specific Assessment & Plan notes found for this encounter.   CAD on cath 2020, low risk NST 6/2023In light of GI bleed and PE now on eliquis -I discussed with Dr. Marlou Porch. Will stop Plavix completely.  Chronic Afib was on coumadin and plavix but held with recent GI bleed. Still very weak.  Now on eliquis for PE. Has appt next week to see if candidate for watchman.  Acute PE now on eliquis and GI wants to know if he can hold plavix while on eliquis for PE  GI bleed requiring 4 untis PRBC's  CHB s/p PPM insertion followed by Dr. Graceann Congress and dizzy. Will stop lasix and decrease Imdur 30 mg once daily. Scheduled for blood work next week and Hbg 9.1 -3 days ago              Dispo:  No follow-ups on file.   Medication Adjustments/Labs and Tests Ordered: Current medicines are reviewed at length with the patient today.  Concerns regarding medicines are outlined above.  Tests Ordered: No orders of the defined types were placed in this encounter.  Medication Changes: Meds ordered this encounter  Medications   isosorbide mononitrate (IMDUR) 30 MG 24 hr tablet    Sig: Take 1 tablet (30 mg total) by mouth daily.    Dispense:  30 tablet    Refill:  1    Decrease in dose   Signed, Ermalinda Barrios, PA-C  03/15/2022 1:40 PM    [place], [place], [place]  [address] Phone: [phone]; Fax: [phone]

## 2022-03-14 ENCOUNTER — Telehealth (HOSPITAL_COMMUNITY): Payer: Self-pay

## 2022-03-14 ENCOUNTER — Ambulatory Visit: Payer: Medicare Other | Admitting: Pharmacist

## 2022-03-14 ENCOUNTER — Other Ambulatory Visit (HOSPITAL_COMMUNITY): Payer: Self-pay

## 2022-03-14 DIAGNOSIS — I25119 Atherosclerotic heart disease of native coronary artery with unspecified angina pectoris: Secondary | ICD-10-CM

## 2022-03-14 DIAGNOSIS — I10 Essential (primary) hypertension: Secondary | ICD-10-CM

## 2022-03-14 DIAGNOSIS — E1165 Type 2 diabetes mellitus with hyperglycemia: Secondary | ICD-10-CM

## 2022-03-14 DIAGNOSIS — E785 Hyperlipidemia, unspecified: Secondary | ICD-10-CM

## 2022-03-14 DIAGNOSIS — I4819 Other persistent atrial fibrillation: Secondary | ICD-10-CM

## 2022-03-14 DIAGNOSIS — Z7901 Long term (current) use of anticoagulants: Secondary | ICD-10-CM

## 2022-03-14 LAB — CULTURE, BLOOD (ROUTINE X 2): Culture: NO GROWTH

## 2022-03-14 NOTE — Telephone Encounter (Signed)
Pharmacy Transitions of Care Follow-up Telephone Call  Date of discharge: 03/11/22  Discharge Diagnosis: PE and DVT  How have you been since you were released from the hospital? He is still rather weak since discharge, doing a lot of resting. He already had his refills transferred to his home pharmacy. He was on long term warfarin so he was very familiar with blood thinners and what to look out for but re-emphasized the importance of checking for blood in stool given his history of GI bleeds.  Medication changes made at discharge: START taking these medications  START taking these medications * Eliquis DVT/PE Starter Pack Generic drug: Apixaban Starter Pack ('10mg'$  and '5mg'$ ) Take as directed on package: start with two-'5mg'$  tablets twice daily for 7 days. On day 8, switch to one-'5mg'$  tablet twice daily.   CHANGE how you take these medications  CHANGE how you take these medications furosemide 20 MG tablet Commonly known as: LASIX TAKE 1 TABLET BY MOUTH EVERY DAY What changed: when to take this  gabapentin 300 MG capsule Commonly known as: NEURONTIN TAKE 1 CAPSULE BY MOUTH EVERY MORNING AND 2 CAPSULES BY MOUTH AT NIGHT FOR NEUROPATHY What changed: See the new instructions.  lisinopril 2.5 MG tablet Commonly known as: ZESTRIL TAKE 1 TABLET BY MOUTH EVERY DAY What changed: when to take this  pantoprazole 40 MG tablet Commonly known as: PROTONIX Take 1 tablet (40 mg total) by mouth daily. What changed: when to take this   Medication changes verified by the patient? Yes (Yes/No)   Medication Accessibility:  Home Pharmacy: [email or web address]   Was the patient provided with refills on discharged medications? yes   Have all prescriptions been transferred from [facility] to home pharmacy? yes   Is the patient interested in using a [facility]? no  Medication Review:  APIXABAN (ELIQUIS)  Apixaban 10 mg BID initiated on 11/25 for DVT/PE. Will switch to apixaban 5 mg BID after 7 days (DATE  12/1).  - Discussed importance of taking medication around the same time every day.  - Advised patient of medications to avoid (NSAIDs, ASA maintenance doses>100 mg daily)  - Educated that Tylenol (acetaminophen) will be the preferred analgesic to prevent risk of bleeding. - Emphasized importance of monitoring for signs and symptoms of bleeding (abnormal bruising, prolonged bleeding, nose bleeds, bleeding from gums, discolored urine, black tarry stools)  - Advised patient to alert all providers of anticoagulation therapy prior to starting a new medication or having a procedure.  Follow-up Appointments:  PCP Hospital f/u appt confirmed? yes Scheduled to see         03/22/2022 10:40 AM HOSPITAL FU 20 min [place] [facility] f/u appt confirmed? yes Scheduled to see   03/15/2022  1:45 PM OFFICE VISIT 30 min [facility] [[phone]]    Rodolph Bong, PharmD Candidate 03/14/2022 4:14 PM

## 2022-03-14 NOTE — Telephone Encounter (Signed)
Patient scheduled.

## 2022-03-14 NOTE — Patient Instructions (Addendum)
Visit Information  Phone number for Pharmacist: [phone]   Goals Addressed   None     Care Plan : [place]  Updates made by Charlton Haws, RPH since 03/14/2022 12:00 AM     Problem: Hypertension, Hyperlipidemia, Diabetes, Atrial Fibrillation, COPD, and Depression   Priority: High  Onset Date: 12/07/2020     Long-Range Goal: Disease mgmt   Start Date: 06/10/2021  Expected End Date: 06/10/2022  This Visit's Progress: On track  Recent Progress: On track  Priority: High  Note:   Current Barriers:  Recent GI bleeding on Eliquis and Plavix  Pharmacist Clinical Goal(s):  Patient will contact provider office for questions/concerns as evidenced notation of same in electronic health record through collaboration with PharmD and provider.   Interventions: 1:1 collaboration with [name], NP regarding development and update of comprehensive plan of care as evidenced by provider attestation and co-signature Inter-disciplinary care team collaboration (see longitudinal plan of care) Comprehensive medication review performed; medication list updated in electronic medical record  Hypertension / Heart Failure (BP goal <130/80) -Controlled - BP at goal in recent OV -Hx OSA, unable to tolerate CPAP -LVEF 55-60% (11/25/20); mild Left ventricular hypertrophy -Current home BP readings: not checking currently -Current treatment: Lisinopril 2.5 mg daily -Appropriate, Effective, Safe, Accessible Furosemide 20 mg daily -Appropriate, Effective, Safe, Accessible Isosorbide Mononitrate 60 mg daily -Appropriate, Effective, Safe, Accessible -Medications previously tried: none reported  -Educated on BP goals and benefits of medications for prevention of heart attack, stroke and kidney damage; -Counseled to monitor BP at home a few days a week -Recommended to continue current medication   Atrial Fibrillation (Goal: prevent stroke and major bleeding) -Controlled - per  patient report no symptoms, INR stable -Followed by cardiology -CHADSVASC: 5 -Current treatment: Eliquis 10 mg BID, start 5 mg BID 03/18/22 - Appropriate, Effective, Safe, Accessible -Medications previously tried: metoprolol; warfarin (GI bleed x 2) -Counseled on importance of adherence to anticoagulant exactly as prescribed; avoidance of NSAIDs due to increased bleeding risk with anticoagulants; -Recommended to continue current medication  Acute PE/DVT (Goal: prevent recurrence) -Stable - pt affirms compliance with Eliquis and has stopped warfarin; he has starter pack for Eliquis and is still taking 10 mg BID in first 7 days, plans to transition to 5 mg BID 12/2 per package instructions; -GI bleed 11/13 on warfarin/Plavix > held warfarin. Readmitted 11/22 with GI bleed and PE > start Eliquis, hold Plavix. -Current treatment  Eliquis 10 mg BID, start 5 mg BID 03/18/22 -Appropriate, Effective, Safe, Accessible -Medications previously tried: warfarin -Discussed Eliquis cost - he reports copay was $0; he has Medicare/Medicaid so cost should not be an issue going forward -Of note patient is still taking clopidogrel - see HLD/CAD discussion below -Recommended to continue current medication  Hyperlipidemia/CAD: (LDL goal < 70) -Controlled - LDL 49 (10/2021) at goal; TRIG 166 (10/2021) slightly elevated -Hx CAD, stent Feb 2020, followed by cardiology -Pt denies any chest pain or abnormal symptoms. Affirms daily adherence.  -Current treatment: Rosuvastatin 20 mg daily - Appropriate, Effective, Safe, Accessible Clopidogrel 75 mg daily- Appropriate, Effective, Query Safe Isosorbide Mononitrate 60 mg daily -Appropriate, Effective, Safe, Accessible Nitroglycerin - PRN  -Appropriate, Effective, Safe, Accessible Fish Oil 1000 mg daily -Appropriate, Effective, Safe, Accessible -Medications previously tried: none reported  -Educated on Cholesterol goals;  -Reviewed role of fish oil and antiplatelet  properties, given minimally elevated TRIG risks likely > benefits in s/o of recent GI bleeding; advised to stop fish oil -  Reviewed role of clopidogrel in s/o of anticoagulation and recent GI bleed - given stent was > 12 months ago it would be reasonable to discontinue clopidogrel and continue Eliquis alone or with aspirin, advised to discuss with cardiology at appt 11/29  Diabetes (A1c goal <7%) -Not ideally controlled - A1c 7.0 (04/2021) at goal; pt was recently taken off metformin and Januvia due to diarrhea and elevated lipase, respectively -Current home glucose readings - checks fasting 3 days per week fasting glucose: <150 -Current medications: Jardiance 25 mg daily - Appropriate, Effective, Safe, Accessible Glipizide XL 10 mg daily - Appropriate, Effective, Safe, Accessible -Medications previously tried: metformin (diarrhea), Januvia (elevated lipase) -Reviewed benefits of medications and fasting BG goal < 130 -Recommend to continue current medication  COPD/OSA (Goal: control symptoms and prevent exacerbations) -Controlled -  per patient report, no issues with breathing/wheezing -No active tobacco use. Quit 2013. Low dose CT 05/06/21 normal.  -No PFTs found. No CAT score documented. -Current treatment  Symbicort 160-4.5 mcg/act - 2 puff BID (takes PRN) - Appropriate, Effective, Safe, Accessible Albuterol PRN - Appropriate, Effective, Safe, Accessible -Medications previously tried: none reported -Exacerbations requiring treatment in last 6 months: none -Patient denies consistent use of maintenance inhaler. He uses Symbicort PRN -Frequency of rescue inhaler use: ~ 1-2 x/week -Counseled on Differences between maintenance and rescue inhalers -Recommended to continue current medication for now.  Depression/Insomnia (Goal: Improve mood, sleep) -Controlled - pt reports improvement in mood since duloxetine was increased -Caffeine: 2 cups coffee, drinks coke zero - 2 cans/day. No alcohol or  tobacco use -PHQ9= 2 (03/2021) - no/minimal depression -Current treatment: Duloxetine 60 mg daily - Appropriate, Effective, Safe, Accessible Duloxetine 30 mg daily -Appropriate, Effective, Safe, Accessible -Medications previously tried/failed: Lexapro 10 mg, Bupropion 150 mg BID, citalopram -Recommended to continue current medication  Health Maintenance -Vaccine gaps: Prevnar20 or PPSV23, TDAP -Hx prostate cancer, x/p radiation and Lupron early 2020  Patient Goals/Self-Care Activities Patient will:  - take medications as prescribed check glucose 3 days per week, document, and provide at future appointments check blood pressure 3 days per week, document, and provide at future appointments       The patient verbalized understanding of instructions, educational materials, and care plan provided today and DECLINED offer to receive copy of patient instructions, educational materials, and care plan.  Telephone follow up appointment with pharmacy team member scheduled for: 2 months  Charlene Brooke, PharmD, [facility] Clinical Pharmacist [facility] [phone]

## 2022-03-15 ENCOUNTER — Ambulatory Visit: Payer: Medicare Other | Attending: Physician Assistant | Admitting: Physician Assistant

## 2022-03-15 ENCOUNTER — Encounter: Payer: Self-pay | Admitting: Physician Assistant

## 2022-03-15 VITALS — BP 110/66 | HR 87 | Ht 71.0 in | Wt 212.0 lb

## 2022-03-15 DIAGNOSIS — I2510 Atherosclerotic heart disease of native coronary artery without angina pectoris: Secondary | ICD-10-CM | POA: Diagnosis not present

## 2022-03-15 DIAGNOSIS — I2690 Septic pulmonary embolism without acute cor pulmonale: Secondary | ICD-10-CM | POA: Diagnosis not present

## 2022-03-15 DIAGNOSIS — K922 Gastrointestinal hemorrhage, unspecified: Secondary | ICD-10-CM | POA: Diagnosis not present

## 2022-03-15 DIAGNOSIS — I482 Chronic atrial fibrillation, unspecified: Secondary | ICD-10-CM

## 2022-03-15 DIAGNOSIS — Z950 Presence of cardiac pacemaker: Secondary | ICD-10-CM | POA: Diagnosis not present

## 2022-03-15 DIAGNOSIS — I10 Essential (primary) hypertension: Secondary | ICD-10-CM | POA: Diagnosis not present

## 2022-03-15 MED ORDER — ISOSORBIDE MONONITRATE ER 30 MG PO TB24: 30.0000 mg | ORAL_TABLET | Freq: Every day | ORAL | 1 refills | Status: DC

## 2022-03-15 NOTE — Patient Instructions (Addendum)
Medication Instructions:  STOP Lasix STOP Plavix DECREASE Imdur to '30mg'$  Take 1 tablet daily *If you need a refill on your cardiac medications before your next appointment, please call your pharmacy*   Lab Work: None Ordered  Testing/Procedures: None ordered  Follow-Up: At [facility], you and your health needs are our priority.  As part of our continuing mission to provide you with exceptional heart care, we have created designated Provider Care Teams.  These Care Teams include your primary Cardiologist (physician) and Advanced Practice Providers (APPs -  Physician Assistants and Nurse Practitioners) who all work together to provide you with the care you need, when you need it.  We recommend signing up for the patient portal called "MyChart".  Sign up information is provided on this After Visit Summary.  MyChart is used to connect with patients for Virtual Visits (Telemedicine).  Patients are able to view lab/test results, encounter notes, upcoming appointments, etc.  Non-urgent messages can be sent to your provider as well.   To learn more about what you can do with MyChart, go to [email or web address].    Your next appointment:   1-2 month(s)  The format for your next appointment:   In Person  Provider:   Candee Furbish, MD     Other Instructions   Important Information About Sugar

## 2022-03-20 ENCOUNTER — Telehealth: Payer: Self-pay

## 2022-03-20 ENCOUNTER — Encounter (HOSPITAL_COMMUNITY): Payer: Self-pay | Admitting: Physician Assistant

## 2022-03-20 ENCOUNTER — Ambulatory Visit (HOSPITAL_COMMUNITY)
Admission: RE | Admit: 2022-03-20 | Discharge: 2022-03-20 | Disposition: A | Discharge status: Home / Self Care | Payer: Medicare Other | Source: Ambulatory Visit | Attending: Physician Assistant | Admitting: Physician Assistant

## 2022-03-20 VITALS — BP 102/56 | HR 88 | Ht 71.0 in | Wt 214.0 lb

## 2022-03-20 DIAGNOSIS — I4821 Permanent atrial fibrillation: Secondary | ICD-10-CM | POA: Insufficient documentation

## 2022-03-20 DIAGNOSIS — G4733 Obstructive sleep apnea (adult) (pediatric): Secondary | ICD-10-CM | POA: Diagnosis not present

## 2022-03-20 DIAGNOSIS — I4892 Unspecified atrial flutter: Secondary | ICD-10-CM | POA: Insufficient documentation

## 2022-03-20 DIAGNOSIS — I2510 Atherosclerotic heart disease of native coronary artery without angina pectoris: Secondary | ICD-10-CM | POA: Insufficient documentation

## 2022-03-20 DIAGNOSIS — E119 Type 2 diabetes mellitus without complications: Secondary | ICD-10-CM | POA: Insufficient documentation

## 2022-03-20 DIAGNOSIS — Z87891 Personal history of nicotine dependence: Secondary | ICD-10-CM | POA: Diagnosis not present

## 2022-03-20 DIAGNOSIS — Z86711 Personal history of pulmonary embolism: Secondary | ICD-10-CM | POA: Diagnosis not present

## 2022-03-20 DIAGNOSIS — E785 Hyperlipidemia, unspecified: Secondary | ICD-10-CM | POA: Insufficient documentation

## 2022-03-20 DIAGNOSIS — Z7901 Long term (current) use of anticoagulants: Secondary | ICD-10-CM | POA: Insufficient documentation

## 2022-03-20 DIAGNOSIS — Z950 Presence of cardiac pacemaker: Secondary | ICD-10-CM

## 2022-03-20 DIAGNOSIS — Z7984 Long term (current) use of oral hypoglycemic drugs: Secondary | ICD-10-CM | POA: Diagnosis not present

## 2022-03-20 DIAGNOSIS — I10 Essential (primary) hypertension: Secondary | ICD-10-CM | POA: Insufficient documentation

## 2022-03-20 DIAGNOSIS — D6869 Other thrombophilia: Secondary | ICD-10-CM | POA: Diagnosis not present

## 2022-03-20 DIAGNOSIS — Z79899 Other long term (current) drug therapy: Secondary | ICD-10-CM | POA: Diagnosis not present

## 2022-03-20 NOTE — Progress Notes (Signed)
Primary Care Physician: [name], NP Primary Cardiologist: Dr Marlou Porch Primary Electrophysiologist: Dr [name] Referring Physician: Estella Husk PA   [name] Cook is a 71 y.o. male with a history of CHB s/p PPM, CAD, DM, HLD, HTN, OSA, tobacco use, atrial flutter, atrial fibrillation who presents for follow up in the [facility].  The patient was initially diagnosed with atrial fibrillation in 2012 and failed several AAD and DCCVs. He had an AV node ablation in 2014. He was on Plavix and warfarin but was admitted 02/27/22 with an acute GI bleed and warfarin was held. He was readmitted from the ED 03/08/22 with an acute PE. Patient was started on Eliquis for a CHADS2VASC score of 4. He reports that he feels much better with more energy, less SOB, and less dizziness.   Today, he denies symptoms of palpitations, chest pain, shortness of breath, orthopnea, PND, lower extremity edema, dizziness, presyncope, syncope, bleeding, or neurologic sequela. The patient is tolerating medications without difficulties and is otherwise without complaint today.    Atrial Fibrillation Risk Factors:  he does have symptoms or diagnosis of sleep apnea. he is not compliant with CPAP therapy. he does not have a history of rheumatic fever.   he has a BMI of Body mass index is 29.85 kg/m.Marland Kitchen Filed Weights   03/20/22 0907  Weight: 97.1 kg    Family History  Problem Relation Age of Onset   Alzheimer's disease Mother    Osteoporosis Mother     Atrial Fibrillation Management history:  CHADS2VASC score: 4 Anticoagulation history: warfarin, Eliquis   Past Medical History:  Diagnosis Date   Acute bronchitis with COPD ([place]) 03/07/2021   Arthritis    "knees and lower back" (03/14/2013)   Atrial flutter ([place])    radiofrequency ablation in 2001   CAD (coronary artery disease)    a. Nonobstructive. Cardiac cath in 2001-50% mid RI, normal LM, LAD, RCA b. cath 10/16/2014 95%  mid RCA treated with DES, 99% ost D1 medical management due to small aneurysmal segment   Chronic anticoagulation    chronic Coumadin anticoagulation   Chronic obstructive pulmonary disease ([place]) 04/20/2011   Diabetes mellitus, type 2 (HCC)    Elevated lipase 06/23/2021   GERD (gastroesophageal reflux disease)    Hyperlipidemia    Hypertension    with hypertensive heart disease   Left knee pain 10/25/2017   medial   Obesity    Persistent atrial fibrillation ([place])    recurrent atrial flutter since 2001 s/p DCCVs, multiple failed AADs, h/o tachy-mediated cardiomyopathy   Shortness of breath    "can come on at any time" (03/14/2013)   Sleep apnea    "dx'd; couldn't wear the mask" (03/14/2013)   Tobacco abuse    Past Surgical History:  Procedure Laterality Date   ATRIAL FLUTTER ABLATION  2002   atrial flutter; subsequently developed atrial fibrillation   AV NODE ABLATION  01/24/2013   CARDIAC CATHETERIZATION  2002   CARDIAC CATHETERIZATION N/A 10/16/2014   Procedure: Left Heart Cath and Coronary Angiography;  Surgeon: [name], MD;  Location: [place] [facility];  Service: Cardiovascular;  Laterality: N/A;   CARDIOVERSION  05/31/2011   Procedure: CARDIOVERSION;  Surgeon: Cristopher Estimable. Lattie Haw, MD;  Location: [facility];  Service: Cardiovascular;  Laterality: N/A;   CARPAL TUNNEL RELEASE Left 1980's   COLONOSCOPY WITH PROPOFOL N/A 12/30/2018   Procedure: COLONOSCOPY WITH PROPOFOL;  Surgeon: Jonathon Bellows, MD;  Location: [facility] ENDOSCOPY;  Service: Gastroenterology;  Laterality: N/A;   COLONOSCOPY WITH PROPOFOL N/A 12/04/2019   Procedure: COLONOSCOPY WITH PROPOFOL;  Surgeon: Jonathon Bellows, MD;  Location: [facility] ENDOSCOPY;  Service: Gastroenterology;  Laterality: N/A;   COLONOSCOPY WITH PROPOFOL N/A 06/13/2021   Procedure: COLONOSCOPY WITH PROPOFOL;  Surgeon: Jonathon Bellows, MD;  Location: [facility] ENDOSCOPY;  Service: Gastroenterology;  Laterality: N/A;   ESOPHAGOGASTRODUODENOSCOPY N/A 03/01/2022   Procedure:  ESOPHAGOGASTRODUODENOSCOPY (EGD);  Surgeon: Clarene Essex, MD;  Location: [facility];  Service: Gastroenterology;  Laterality: N/A;   GIVENS CAPSULE STUDY Left 03/10/2022   Procedure: GIVENS CAPSULE STUDY;  Surgeon: Arta Silence, MD;  Location: [facility] ENDOSCOPY;  Service: Gastroenterology;  Laterality: Left;   INSERT / REPLACE / REMOVE PACEMAKER  01/24/2013    Medtronic Adapta L dual-chamber pacemaker, serial number [id number]    LEFT HEART CATH AND CORONARY ANGIOGRAPHY N/A 06/07/2018   Procedure: LEFT HEART CATH AND CORONARY ANGIOGRAPHY;  Surgeon: [name], MD;  Location: [place] [facility];  Service: Cardiovascular;  Laterality: N/A;   LEFT HEART CATHETERIZATION WITH CORONARY ANGIOGRAM N/A 03/17/2013   Procedure: LEFT HEART CATHETERIZATION WITH CORONARY ANGIOGRAM;  Surgeon: Burnell Blanks, MD; LAD mild dz, D1 branch 100%, inferior branch 99%, CFX OK, RCA 50%, EF 65%     LOOP RECORDER IMPLANT  2002   PERMANENT PACEMAKER INSERTION N/A 01/24/2013   Procedure: PERMANENT PACEMAKER INSERTION;  Surgeon: [name], MD;  Location[facility] CATH LAB;  Service: Cardiovascular;  Laterality: N/A;   TIBIAL TUBERCLERPLASTY  ~ 2003    Current Outpatient Medications  Medication Sig Dispense Refill   albuterol (VENTOLIN HFA) 108 (90 Base) MCG/ACT inhaler INHALE 2 PUFFS BY MOUTH EVERY 4 HOURS AS NEEDED FOR WHEEZE OR FOR SHORTNESS OF BREATH 8.5 each 0   [START ON 04/10/2022] apixaban (ELIQUIS) 5 MG TABS tablet Take 1 tablet (5 mg total) by mouth 2 (two) times daily. 60 tablet 1   APIXABAN (ELIQUIS) VTE STARTER PACK (10MG AND 5MG) Take as directed on package: start with two-51m tablets twice daily for 7 days. On day 8, switch to one-531mtablet twice daily. 74 each 0   Ascorbic Acid (VITAMIN C) 1000 MG tablet Take 2,000 mg by mouth daily.     blood glucose meter kit and supplies KIT Dispense based on patient and insurance preference. Use up to four times daily as directed. (FOR ICD-9 250.00, 250.01). 1  each 0   budesonide-formoterol (SYMBICORT) 160-4.5 MCG/ACT inhaler INHALE 2 PUFFS INTO THE LUNGS TWICE A DAY 10.2 each 5   fish oil-omega-3 fatty acids 1000 MG capsule Take 2 g by mouth daily.     fluticasone (FLONASE) 50 MCG/ACT nasal spray PLACE 1 SPRAY INTO BOTH NOSTRILS DAILY AS NEEDED FOR ALLERGIES OR RHINITIS. 48 mL 3   gabapentin (NEURONTIN) 300 MG capsule TAKE 1 CAPSULE BY MOUTH EVERY MORNING AND 2 CAPSULES BY MOUTH AT NIGHT FOR NEUROPATHY 270 capsule 3   glipiZIDE (GLUCOTROL XL) 10 MG 24 hr tablet TAKE 1 TABLET (10 MG TOTAL) BY MOUTH DAILY WITH BREAKFAST. FOR DIABETES. 90 tablet 1   glucose blood test strip 1 each by Other route as needed for other. Use to check blood sugar up to three times a day-Accu-Chek meter     isosorbide mononitrate (IMDUR) 30 MG 24 hr tablet Take 1 tablet (30 mg total) by mouth daily. 30 tablet 1   JARDIANCE 25 MG TABS tablet TAKE 1 TABLET (25 MG TOTAL) BY MOUTH DAILY BEFORE BREAKFAST. FOR DIABETES. 90 tablet 1   lisinopril (ZESTRIL) 2.5  MG tablet TAKE 1 TABLET BY MOUTH EVERY DAY 90 tablet 3   nitroGLYCERIN (NITROSTAT) 0.4 MG SL tablet Place 1 tablet (0.4 mg total) under the tongue every 5 (five) minutes as needed for chest pain. 25 tablet 0   pantoprazole (PROTONIX) 40 MG tablet Take 1 tablet (40 mg total) by mouth daily. 30 tablet 2   rosuvastatin (CRESTOR) 20 MG tablet TAKE 1 TABLET BY MOUTH EVERY DAY IN THE EVENING FOR CHOLESTEROL 90 tablet 3   sertraline (ZOLOFT) 50 MG tablet TAKE 1 TABLET (50 MG TOTAL) BY MOUTH DAILY. FOR ANXIETY AND DEPRESSION. 90 tablet 2   tamsulosin (FLOMAX) 0.4 MG CAPS capsule TAKE 1 CAPSULE (0.4 MG TOTAL) BY MOUTH 2 (TWO) TIMES DAILY. 180 capsule 3   TYLENOL 500 MG tablet Take 500-1,000 mg by mouth every 6 (six) hours as needed for mild pain or headache.     No current facility-administered medications for this encounter.    Allergies  Allergen Reactions   Januvia [Sitagliptin] Other (See Comments)    Elevated lipase   Metformin  And Related Diarrhea    Social History   Socioeconomic History   Marital status: Widowed    Spouse name: Not on file   Number of children: 1   Years of education: Not on file   Highest education level: Not on file  Occupational History   Occupation: Unemployed    Employer: UNEMPLOYED  Tobacco Use   Smoking status: Former    Packs/day: 1.00    Years: 42.00    Total pack years: 42.00    Types: Cigarettes    Quit date: 12/30/2013    Years since quitting: 8.2   Smokeless tobacco: Never   Tobacco comments:    Former smoker 03/20/22  Vaping Use   Vaping Use: Never used  Substance and Sexual Activity   Alcohol use: Not Currently    Comment: 03/14/2013 "stopped drinking back in 2002; never had problem w/it"   Drug use: No   Sexual activity: Not Currently  Other Topics Concern   Not on file  Social History Narrative   Single.   Retired.    1 son, deceased.    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   Enjoys playing on the computer.       Social Determinants of Health   Financial Resource Strain: Low Risk  (10/07/2021)   Overall Financial Resource Strain (CARDIA)    Difficulty of Paying Living Expenses: Not hard at all  Food Insecurity: No Food Insecurity (03/09/2022)   Hunger Vital Sign    Worried About Running Out of Food in the Last Year: Never true    Ran Out of Food in the Last Year: Never true  Transportation Needs: No Transportation Needs (03/09/2022)   PRAPARE - Hydrologist (Medical): No    Lack of Transportation (Non-Medical): No  Physical Activity: Unknown (10/07/2021)   Exercise Vital Sign    Days of Exercise per Week: 3 days    Minutes of Exercise per Session: Not on file  Stress: No Stress Concern Present (10/07/2021)   [place]    Feeling of Stress : Not at all  Social Connections: Socially Isolated (10/07/2021)    Social Connection and Isolation Panel [NHANES]    Frequency of Communication with Friends and Family: Once a week    Frequency of Social Gatherings with Friends and Family: Once a week  Attends Religious Services: Never    Active Member of Clubs or Organizations: No    Attends Archivist Meetings: Never    Marital Status: Widowed  Intimate Partner Violence: Not At Risk (03/09/2022)   Humiliation, Afraid, Rape, and Kick questionnaire    Fear of Current or Ex-Partner: No    Emotionally Abused: No    Physically Abused: No    Sexually Abused: No     ROS- All systems are reviewed and negative except as per the HPI above.  Physical Exam: Vitals:   03/20/22 0907  BP: (!) 102/56  Pulse: 88  Weight: 97.1 kg  Height: _0  (1.803 m)    GEN- The patient is a well appearing male, alert and oriented x 3 today.   Head- normocephalic, atraumatic Eyes-  Sclera clear, conjunctiva pink Ears- hearing intact Oropharynx- clear Neck- supple  Lungs- Clear to ausculation bilaterally, normal work of breathing Heart- Regular rate and rhythm, no murmurs, rubs or gallops  GI- soft, NT, ND, + BS Extremities- no clubbing, cyanosis, or edema MS- no significant deformity or atrophy Skin- no rash or lesion Psych- euthymic mood, full affect Neuro- strength and sensation are intact  Wt Readings from Last 3 Encounters:  03/20/22 97.1 kg  03/15/22 96.2 kg  03/09/22 98.6 kg    EKG today demonstrates  V pacing with underlying atypical flutter vs afib Vent. rate 88 BPM PR interval * ms QRS duration 186 ms QT/QTcB 448/542 ms  Echo 03/09/22 demonstrated   1. Left ventricular ejection fraction, by estimation, is 50%. The left  ventricle has low normal function. Left ventricular endocardial border not  optimally defined to evaluate regional wall motion. There is mild  concentric left ventricular hypertrophy.  Left ventricular diastolic parameters are indeterminate.   2. Right  ventricular systolic function is normal. The right ventricular  size is normal. There is normal pulmonary artery systolic pressure.   3. Left atrial size was mildly dilated.   4. The mitral valve is grossly normal. No evidence of mitral valve  regurgitation. No evidence of mitral stenosis.   5. The aortic valve is tricuspid. Aortic valve regurgitation is not  visualized. Aortic valve sclerosis is present, with no evidence of aortic  valve stenosis.   6. The inferior vena cava is dilated in size with >50% respiratory  variability, suggesting right atrial pressure of 8 mmHg.   Comparison(s): Apex is not as well defined in this study.   Epic records are reviewed at length today  CHA2DS2-VASc Score = 4  The patient's score is based upon: CHF History: 0 HTN History: 1 Diabetes History: 1 Stroke History: 0 Vascular Disease History: 1 Age Score: 1 Gender Score: 0       ASSESSMENT AND PLAN: 1. Permanent Atrial Fibrillation/atrial flutter The patient's CHA2DS2-VASc score is 4, indicating a 4.8% annual risk of stroke.   S/p AV node ablation 2014 Will need to continue Eliquis for now for PE treatment.  We discussed Watchman today for long term stroke prevention given his recent GI bleeding requiring transfusion. He is interested in a consultation, will refer to [facility] team. Informational brochure given.   2. Secondary Hypercoagulable State (ICD10:  D68.69) The patient is at significant risk for stroke/thromboembolism based upon his CHA2DS2-VASc Score of 4.  Continue Apixaban (Eliquis).   3. Obstructive sleep apnea Patient intolerant of CPAP.  4. CHB S/p PPM, followed by Dr [name] and the device clinic.  5. CAD No anginal symptoms. Off antiplatelets with recent  GI bleed.  6. HTN Stable, no changes today.   Follow up with Estella Husk as scheduled. Will refer to Dr Burt Knack or Dr Quentin Ore to discuss [place] candidacy.    [facility] [address] [place], [place] [phone] 03/20/2022 9:21 AM

## 2022-03-20 NOTE — Telephone Encounter (Signed)
Per Adline Peals, arranged Watchman consult 04/01/2023 with Dr. Burt Knack. The patient was grateful for call and agreed with plan.

## 2022-03-21 ENCOUNTER — Other Ambulatory Visit: Payer: Medicare Other

## 2022-03-21 NOTE — Progress Notes (Signed)
[facility] Quality Team Note  Name: [name] Cook Date of Birth: [birth year] MRN: [id number] Date: 03/21/2022  [facility] Quality Team has reviewed this patient's chart, please see recommendations below:  [facility] Quality Other; Pt has open quality gap for KED, needs Micro/Creat Urine test to close this. Please address at upcoming appointment.

## 2022-03-22 ENCOUNTER — Encounter: Payer: Self-pay | Admitting: Primary Care

## 2022-03-22 ENCOUNTER — Ambulatory Visit (INDEPENDENT_AMBULATORY_CARE_PROVIDER_SITE_OTHER): Payer: Medicare Other | Admitting: Primary Care

## 2022-03-22 VITALS — BP 110/58 | HR 70 | Temp 96.6°F | Ht 71.0 in | Wt 216.0 lb

## 2022-03-22 DIAGNOSIS — I2699 Other pulmonary embolism without acute cor pulmonale: Secondary | ICD-10-CM | POA: Diagnosis not present

## 2022-03-22 DIAGNOSIS — K922 Gastrointestinal hemorrhage, unspecified: Secondary | ICD-10-CM | POA: Diagnosis not present

## 2022-03-22 DIAGNOSIS — I4819 Other persistent atrial fibrillation: Secondary | ICD-10-CM | POA: Diagnosis not present

## 2022-03-22 LAB — CBC
HCT: 27.6 % — ABNORMAL LOW (ref 39.0–52.0)
Hemoglobin: 9 g/dL — ABNORMAL LOW (ref 13.0–17.0)
MCHC: 32.6 g/dL (ref 30.0–36.0)
MCV: 88.5 fl (ref 78.0–100.0)
Platelets: 157 10*3/uL (ref 150.0–400.0)
RBC: 3.12 Mil/uL — ABNORMAL LOW (ref 4.22–5.81)
RDW: 15.9 % — ABNORMAL HIGH (ref 11.5–15.5)
WBC: 3.9 10*3/uL — ABNORMAL LOW (ref 4.0–10.5)

## 2022-03-22 NOTE — Assessment & Plan Note (Signed)
Continue Eliquis 5 mg twice daily, no longer on warfarin.  He will proceed with watchman procedure evaluation. Remain off of clopidogrel.  Reviewed office notes from atrial fibrillation clinic from December 2023.

## 2022-03-22 NOTE — Assessment & Plan Note (Signed)
Recent hospitalization, both hospital visits reviewed from November 2023. No recurrence of GI bleed.  He will check at home to see if he has been taking pantoprazole 40 mg daily, if not he will start.  Repeat CBC pending.

## 2022-03-22 NOTE — Progress Notes (Signed)
Subjective:    Patient ID: [name] Cook, male    DOB: [birth year], 71 y.o.   MRN: [id number]  HPI  [name] Cook is a very pleasant 71 y.o. male with a significant medical history including atrial fibrillation, hypertension, CAD with acute MI, pulmonary embolism, COPD, OSA, GI bleed, type 2 diabetes, AKI, CKD, pacemaker in place who presents today for hospital follow-up.  He originally presented to [facility] long ED via EMS on 02/27/2022 for rectal bleeding, abdominal discomfort, near syncope.  Prior to arrival he was hypotensive with systolic blood pressure at 70.  Work-up in the ED consistent for acute GI bleed with anemia.  He was treated with 1 unit of packed red blood cells, IV fluids, IV Protonix, admitted for further evaluation.  During his hospital stay he received a total of 4 units of packed red blood cells with stabilization of hemoglobin.  He underwent upper endoscopy which revealed erosive gastropathy without bleeding.  He was discharged home on 03/03/2022 with recommendations for PCP follow-up, Protonix 40 mg twice daily.  His clopidogrel was resumed but he was advised to discontinue his warfarin for the time being.  He returned to [facility] long ED on 03/08/2022 for resumed rectal bleeding, dizziness, fever, weakness, shortness of breath.  Work-up in the ED consistent with acute GI bleed with anemia, acute pulmonary embolism to the right lower lobe, AKI.  He was admitted for further evaluation and treatment.  During this hospital stay he underwent bilateral lower extremity venous Dopplers which revealed left lower extremity DVT involving the posterior tibial veins, superficial vein thrombosis to the left saphenous vein.  He underwent echocardiogram which was stable compared to prior.  He was treated with IV heparin and IV Protonix.  Antihypertensive medications were held due to softer blood pressures.  Clopidogrel was held.  GI consulted who recommended capsule endoscopy which was negative for  acute bleeding.  CBC had stabilized.  He was discharged home on 03/12/2022 anticoagulation treatment was resumed with apixaban starter pack rather than his typical warfarin for his PE.  He was also recommended he follow-up with PCP for repeat CBC, follow-up with cardiology to discuss discontinuation of Plavix.  Since his discharge home he denies dark stools. He was evaluated by his cardiologist and by the atrial fibrillation clinic. His atrial fibrillation provider has referred him for a Watchman procedure. He is compliant to his Eliquis and is down to 5 mg BID. He is not sure if he's taking pantoprazole. He is no longer on Plavix. His dizziness and shortness of breath have improved.   Wt Readings from Last 3 Encounters:  03/22/22 216 lb (98 kg)  03/20/22 214 lb (97.1 kg)  03/15/22 212 lb (96.2 kg)      Review of Systems  Constitutional:  Positive for fatigue.       Improved energy levels  Respiratory:  Positive for shortness of breath.        Improved shortness of breath  Cardiovascular:  Negative for chest pain.  Gastrointestinal:  Negative for abdominal pain and blood in stool.  Neurological:  Negative for dizziness and headaches.         Past Medical History:  Diagnosis Date   Acute bronchitis with COPD ([place]) 03/07/2021   Arthritis    "knees and lower back" (03/14/2013)   Atrial flutter ([place])    radiofrequency ablation in 2001   CAD (coronary artery disease)    a. Nonobstructive. Cardiac cath in 2001-50% mid RI, normal LM, LAD, RCA  b. cath 10/16/2014 95% mid RCA treated with DES, 99% ost D1 medical management due to small aneurysmal segment   Chronic anticoagulation    chronic Coumadin anticoagulation   Chronic obstructive pulmonary disease ([place]) 04/20/2011   Diabetes mellitus, type 2 (HCC)    Elevated lipase 06/23/2021   GERD (gastroesophageal reflux disease)    Hyperlipidemia    Hypertension    with hypertensive heart disease   Left knee pain 10/25/2017   medial    Obesity    Persistent atrial fibrillation ([place])    recurrent atrial flutter since 2001 s/p DCCVs, multiple failed AADs, h/o tachy-mediated cardiomyopathy   Shortness of breath    "can come on at any time" (03/14/2013)   Sleep apnea    "dx'd; couldn't wear the mask" (03/14/2013)   Symptomatic anemia 03/08/2022   Tobacco abuse     Social History   Socioeconomic History   Marital status: Widowed    Spouse name: Not on file   Number of children: 1   Years of education: Not on file   Highest education level: Not on file  Occupational History   Occupation: Unemployed    Employer: UNEMPLOYED  Tobacco Use   Smoking status: Former    Packs/day: 1.00    Years: 42.00    Total pack years: 42.00    Types: Cigarettes    Quit date: 12/30/2013    Years since quitting: 8.2   Smokeless tobacco: Never   Tobacco comments:    Former smoker 03/20/22  Vaping Use   Vaping Use: Never used  Substance and Sexual Activity   Alcohol use: Not Currently    Comment: 03/14/2013 "stopped drinking back in 2002; never had problem w/it"   Drug use: No   Sexual activity: Not Currently  Other Topics Concern   Not on file  Social History Narrative   Single.   Retired.    1 son, deceased.    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   Enjoys playing on the computer.       Social Determinants of Health   Financial Resource Strain: Low Risk  (10/07/2021)   Overall Financial Resource Strain (CARDIA)    Difficulty of Paying Living Expenses: Not hard at all  Food Insecurity: No Food Insecurity (03/09/2022)   Hunger Vital Sign    Worried About Running Out of Food in the Last Year: Never true    Ran Out of Food in the Last Year: Never true  Transportation Needs: No Transportation Needs (03/09/2022)   PRAPARE - Hydrologist (Medical): No    Lack of Transportation (Non-Medical): No  Physical Activity: Unknown (10/07/2021)   Exercise Vital  Sign    Days of Exercise per Week: 3 days    Minutes of Exercise per Session: Not on file  Stress: No Stress Concern Present (10/07/2021)   [place]    Feeling of Stress : Not at all  Social Connections: Socially Isolated (10/07/2021)   Social Connection and Isolation Panel [NHANES]    Frequency of Communication with Friends and Family: Once a week    Frequency of Social Gatherings with Friends and Family: Once a week    Attends Religious Services: Never    Marine scientist or Organizations: No    Attends Archivist Meetings: Never    Marital Status: Widowed  Intimate Partner Violence: Not At Risk (03/09/2022)   Humiliation,  Afraid, Rape, and Kick questionnaire    Fear of Current or Ex-Partner: No    Emotionally Abused: No    Physically Abused: No    Sexually Abused: No    Past Surgical History:  Procedure Laterality Date   ATRIAL FLUTTER ABLATION  2002   atrial flutter; subsequently developed atrial fibrillation   AV NODE ABLATION  01/24/2013   CARDIAC CATHETERIZATION  2002   CARDIAC CATHETERIZATION N/A 10/16/2014   Procedure: Left Heart Cath and Coronary Angiography;  Surgeon: [name], MD;  Location: [facility];  Service: Cardiovascular;  Laterality: N/A;   CARDIOVERSION  05/31/2011   Procedure: CARDIOVERSION;  Surgeon: Cristopher Estimable. Lattie Haw, MD;  Location: [facility];  Service: Cardiovascular;  Laterality: N/A;   CARPAL TUNNEL RELEASE Left 1980's   COLONOSCOPY WITH PROPOFOL N/A 12/30/2018   Procedure: COLONOSCOPY WITH PROPOFOL;  Surgeon: Jonathon Bellows, MD;  Location: [facility] ENDOSCOPY;  Service: Gastroenterology;  Laterality: N/A;   COLONOSCOPY WITH PROPOFOL N/A 12/04/2019   Procedure: COLONOSCOPY WITH PROPOFOL;  Surgeon: Jonathon Bellows, MD;  Location: [facility] ENDOSCOPY;  Service: Gastroenterology;  Laterality: N/A;   COLONOSCOPY WITH PROPOFOL N/A 06/13/2021   Procedure: COLONOSCOPY WITH PROPOFOL;   Surgeon: Jonathon Bellows, MD;  Location: [facility] ENDOSCOPY;  Service: Gastroenterology;  Laterality: N/A;   ESOPHAGOGASTRODUODENOSCOPY N/A 03/01/2022   Procedure: ESOPHAGOGASTRODUODENOSCOPY (EGD);  Surgeon: Clarene Essex, MD;  Location: [facility] ENDOS[facility];  Service: Gastroenterology;  Laterality: N/A;   GIVENS CAPSULE STUDY Left 03/10/2022   Procedure: GIVENS CAPSULE STUDY;  Surgeon: Arta Silence, MD;  Location: [facility] ENDOSCOPY;  Service: Gastroenterology;  Laterality: Left;   INSERT / REPLACE / REMOVE PACEMAKER  01/24/2013    Medtronic Adapta L dual-chamber pacemaker, serial number [id number]    LEFT HEART CATH AND CORONARY ANGIOGRAPHY N/A 06/07/2018   Procedure: LEFT HEART CATH AND CORONARY ANGIOGRAPHY;  Surgeon: [name], MD;  Location: [place] [facility];  Service: Cardiovascular;  Laterality: N/A;   LEFT HEART CATHETERIZATION WITH CORONARY ANGIOGRAM N/A 03/17/2013   Procedure: LEFT HEART CATHETERIZATION WITH CORONARY ANGIOGRAM;  Surgeon: Burnell Blanks, MD; LAD mild dz, D1 branch 100%, inferior branch 99%, CFX OK, RCA 50%, EF 65%     LOOP RECORDER IMPLANT  2002   PERMANENT PACEMAKER INSERTION N/A 01/24/2013   Procedure: PERMANENT PACEMAKER INSERTION;  Surgeon: [name], MD;  Location: [facility] CATH LAB;  Service: Cardiovascular;  Laterality: N/A;   TIBIAL TUBERCLERPLASTY  ~ 2003    Family History  Problem Relation Age of Onset   Alzheimer's disease Mother    Osteoporosis Mother     Allergies  Allergen Reactions   Januvia [Sitagliptin] Other (See Comments)    Elevated lipase   Metformin And Related Diarrhea    Current Outpatient Medications on File Prior to Visit  Medication Sig Dispense Refill   albuterol (VENTOLIN HFA) 108 (90 Base) MCG/ACT inhaler INHALE 2 PUFFS BY MOUTH EVERY 4 HOURS AS NEEDED FOR WHEEZE OR FOR SHORTNESS OF BREATH 8.5 each 0   [START ON 04/10/2022] apixaban (ELIQUIS) 5 MG TABS tablet Take 1 tablet (5 mg total) by mouth 2 (two) times daily. 60 tablet 1    APIXABAN (ELIQUIS) VTE STARTER PACK (10MG AND 5MG) Take as directed on package: start with two-102m tablets twice daily for 7 days. On day 8, switch to one-559mtablet twice daily. 74 each 0   Ascorbic Acid (VITAMIN C) 1000 MG tablet Take 2,000 mg by mouth daily.     blood glucose meter kit and supplies KIT Dispense  based on patient and insurance preference. Use up to four times daily as directed. (FOR ICD-9 250.00, 250.01). 1 each 0   budesonide-formoterol (SYMBICORT) 160-4.5 MCG/ACT inhaler INHALE 2 PUFFS INTO THE LUNGS TWICE A DAY 10.2 each 5   fish oil-omega-3 fatty acids 1000 MG capsule Take 2 g by mouth daily.     fluticasone (FLONASE) 50 MCG/ACT nasal spray PLACE 1 SPRAY INTO BOTH NOSTRILS DAILY AS NEEDED FOR ALLERGIES OR RHINITIS. 48 mL 3   gabapentin (NEURONTIN) 300 MG capsule TAKE 1 CAPSULE BY MOUTH EVERY MORNING AND 2 CAPSULES BY MOUTH AT NIGHT FOR NEUROPATHY 270 capsule 3   glipiZIDE (GLUCOTROL XL) 10 MG 24 hr tablet TAKE 1 TABLET (10 MG TOTAL) BY MOUTH DAILY WITH BREAKFAST. FOR DIABETES. 90 tablet 1   glucose blood test strip 1 each by Other route as needed for other. Use to check blood sugar up to three times a day-Accu-Chek meter     isosorbide mononitrate (IMDUR) 30 MG 24 hr tablet Take 1 tablet (30 mg total) by mouth daily. 30 tablet 1   JARDIANCE 25 MG TABS tablet TAKE 1 TABLET (25 MG TOTAL) BY MOUTH DAILY BEFORE BREAKFAST. FOR DIABETES. 90 tablet 1   lisinopril (ZESTRIL) 2.5 MG tablet TAKE 1 TABLET BY MOUTH EVERY DAY 90 tablet 3   nitroGLYCERIN (NITROSTAT) 0.4 MG SL tablet Place 1 tablet (0.4 mg total) under the tongue every 5 (five) minutes as needed for chest pain. 25 tablet 0   pantoprazole (PROTONIX) 40 MG tablet Take 1 tablet (40 mg total) by mouth daily. 30 tablet 2   rosuvastatin (CRESTOR) 20 MG tablet TAKE 1 TABLET BY MOUTH EVERY DAY IN THE EVENING FOR CHOLESTEROL 90 tablet 3   sertraline (ZOLOFT) 50 MG tablet TAKE 1 TABLET (50 MG TOTAL) BY MOUTH DAILY. FOR ANXIETY AND  DEPRESSION. 90 tablet 2   tamsulosin (FLOMAX) 0.4 MG CAPS capsule TAKE 1 CAPSULE (0.4 MG TOTAL) BY MOUTH 2 (TWO) TIMES DAILY. 180 capsule 3   TYLENOL 500 MG tablet Take 500-1,000 mg by mouth every 6 (six) hours as needed for mild pain or headache.     No current facility-administered medications on file prior to visit.    BP (!) 110/58   Pulse 70   Temp (!) 96.6 F (35.9 C) (Temporal)   Ht _0  (1.803 m)   Wt 216 lb (98 kg)   SpO2 96%   BMI 30.13 kg/m  Objective:   Physical Exam Cardiovascular:     Rate and Rhythm: Normal rate and regular rhythm.  Pulmonary:     Effort: Pulmonary effort is normal.     Breath sounds: Normal breath sounds. No wheezing or rales.  Musculoskeletal:     Cervical back: Neck supple.  Skin:    General: Skin is warm and dry.     Coloration: Skin is pale.  Neurological:     Mental Status: He is alert and oriented to person, place, and time.           Assessment & Plan:   Problem List Items Addressed This Visit       Cardiovascular and Mediastinum   Atrial fibrillation    Continue Eliquis 5 mg twice daily, no longer on warfarin.  He will proceed with watchman procedure evaluation. Remain off of clopidogrel.  Reviewed office notes from atrial fibrillation clinic from December 2023.      Acute pulmonary embolism ([place])    Recent hospitalization.  Hospital notes, labs, imaging reviewed.  Continue  Eliquis 5 mg twice daily for at least 3 months.  Proceed with watchman procedure if applicable. Reviewed atrial fibrillation clinic notes from December 2023.  Remain off of clopidogrel.        Digestive   GIB (gastrointestinal bleeding) - Primary    Recent hospitalization, both hospital visits reviewed from November 2023. No recurrence of GI bleed.  He will check at home to see if he has been taking pantoprazole 40 mg daily, if not he will start.  Repeat CBC pending.        Relevant Orders   CBC       [name],  NP

## 2022-03-22 NOTE — Patient Instructions (Signed)
Stop by the lab prior to leaving today. I will notify you of your results once received.   Check to see if you are taking pantoprazole 40 mg once daily for the stomach bleed.   It was a pleasure to see you today!

## 2022-03-22 NOTE — Assessment & Plan Note (Signed)
Recent hospitalization.  Hospital notes, labs, imaging reviewed.  Continue Eliquis 5 mg twice daily for at least 3 months.  Proceed with watchman procedure if applicable. Reviewed atrial fibrillation clinic notes from December 2023.  Remain off of clopidogrel.

## 2022-03-23 ENCOUNTER — Ambulatory Visit: Payer: Self-pay

## 2022-03-23 ENCOUNTER — Ambulatory Visit: Payer: Medicare Other

## 2022-03-23 NOTE — Progress Notes (Signed)
Pt has been transitioned to Eliquis after hospital admission for melena.  Resolving warfarin episodes.

## 2022-03-24 ENCOUNTER — Ambulatory Visit (INDEPENDENT_AMBULATORY_CARE_PROVIDER_SITE_OTHER): Payer: Medicare Other

## 2022-03-24 DIAGNOSIS — I442 Atrioventricular block, complete: Secondary | ICD-10-CM | POA: Diagnosis not present

## 2022-03-24 LAB — CUP PACEART REMOTE DEVICE CHECK
Battery Impedance: 1189 Ohm
Battery Remaining Longevity: 56 mo
Battery Voltage: 2.78 V
Brady Statistic RV Percent Paced: 100 %
Date Time Interrogation Session: 20231208150257
Implantable Lead Connection Status: 753985
Implantable Lead Connection Status: 753985
Implantable Lead Implant Date: 20141010
Implantable Lead Implant Date: 20141010
Implantable Lead Location: 753859
Implantable Lead Location: 753860
Implantable Lead Model: 5076
Implantable Lead Model: 5076
Implantable Pulse Generator Implant Date: 20141010
Lead Channel Impedance Value: 499 Ohm
Lead Channel Impedance Value: 67 Ohm
Lead Channel Pacing Threshold Amplitude: 0.75 V
Lead Channel Pacing Threshold Pulse Width: 0.4 ms
Lead Channel Setting Pacing Amplitude: 2.5 V
Lead Channel Setting Pacing Pulse Width: 0.4 ms
Lead Channel Setting Sensing Sensitivity: 4 mV
Zone Setting Status: 755011
Zone Setting Status: 755011

## 2022-03-28 ENCOUNTER — Telehealth: Payer: Self-pay | Admitting: Primary Care

## 2022-03-28 ENCOUNTER — Encounter (HOSPITAL_COMMUNITY): Payer: Self-pay

## 2022-03-28 ENCOUNTER — Emergency Department (HOSPITAL_COMMUNITY)
Admission: EM | Admit: 2022-03-28 | Discharge: 2022-03-28 | Disposition: A | Discharge status: Home / Self Care | Payer: Medicare Other | Attending: Emergency Medicine | Admitting: Emergency Medicine

## 2022-03-28 ENCOUNTER — Emergency Department (HOSPITAL_COMMUNITY): Payer: Medicare Other

## 2022-03-28 ENCOUNTER — Other Ambulatory Visit: Payer: Self-pay

## 2022-03-28 DIAGNOSIS — R0902 Hypoxemia: Secondary | ICD-10-CM | POA: Diagnosis not present

## 2022-03-28 DIAGNOSIS — I10 Essential (primary) hypertension: Secondary | ICD-10-CM | POA: Diagnosis not present

## 2022-03-28 DIAGNOSIS — R58 Hemorrhage, not elsewhere classified: Secondary | ICD-10-CM | POA: Diagnosis not present

## 2022-03-28 DIAGNOSIS — Z743 Need for continuous supervision: Secondary | ICD-10-CM | POA: Diagnosis not present

## 2022-03-28 DIAGNOSIS — R079 Chest pain, unspecified: Secondary | ICD-10-CM | POA: Insufficient documentation

## 2022-03-28 DIAGNOSIS — I2510 Atherosclerotic heart disease of native coronary artery without angina pectoris: Secondary | ICD-10-CM | POA: Insufficient documentation

## 2022-03-28 DIAGNOSIS — E119 Type 2 diabetes mellitus without complications: Secondary | ICD-10-CM | POA: Insufficient documentation

## 2022-03-28 DIAGNOSIS — Z1152 Encounter for screening for COVID-19: Secondary | ICD-10-CM | POA: Diagnosis not present

## 2022-03-28 DIAGNOSIS — R404 Transient alteration of awareness: Secondary | ICD-10-CM | POA: Diagnosis not present

## 2022-03-28 DIAGNOSIS — Z79899 Other long term (current) drug therapy: Secondary | ICD-10-CM | POA: Diagnosis not present

## 2022-03-28 DIAGNOSIS — I4819 Other persistent atrial fibrillation: Secondary | ICD-10-CM | POA: Insufficient documentation

## 2022-03-28 DIAGNOSIS — I2699 Other pulmonary embolism without acute cor pulmonale: Secondary | ICD-10-CM | POA: Diagnosis not present

## 2022-03-28 DIAGNOSIS — Z7901 Long term (current) use of anticoagulants: Secondary | ICD-10-CM | POA: Insufficient documentation

## 2022-03-28 DIAGNOSIS — I499 Cardiac arrhythmia, unspecified: Secondary | ICD-10-CM | POA: Diagnosis not present

## 2022-03-28 DIAGNOSIS — R0602 Shortness of breath: Secondary | ICD-10-CM | POA: Insufficient documentation

## 2022-03-28 DIAGNOSIS — K8020 Calculus of gallbladder without cholecystitis without obstruction: Secondary | ICD-10-CM | POA: Diagnosis not present

## 2022-03-28 DIAGNOSIS — R791 Abnormal coagulation profile: Secondary | ICD-10-CM | POA: Insufficient documentation

## 2022-03-28 DIAGNOSIS — Z7984 Long term (current) use of oral hypoglycemic drugs: Secondary | ICD-10-CM | POA: Diagnosis not present

## 2022-03-28 DIAGNOSIS — R109 Unspecified abdominal pain: Secondary | ICD-10-CM | POA: Insufficient documentation

## 2022-03-28 DIAGNOSIS — J439 Emphysema, unspecified: Secondary | ICD-10-CM | POA: Diagnosis not present

## 2022-03-28 DIAGNOSIS — R42 Dizziness and giddiness: Secondary | ICD-10-CM | POA: Insufficient documentation

## 2022-03-28 LAB — PROTIME-INR
INR: 1.3 — ABNORMAL HIGH (ref 0.8–1.2)
Prothrombin Time: 15.9 seconds — ABNORMAL HIGH (ref 11.4–15.2)

## 2022-03-28 LAB — TROPONIN I (HIGH SENSITIVITY)
Troponin I (High Sensitivity): 7 ng/L (ref <18)
Troponin I (High Sensitivity): 6 ng/L (ref <18)

## 2022-03-28 LAB — BASIC METABOLIC PANEL
Anion gap: 7 (ref 5–15)
BUN: 21 mg/dL (ref 8–23)
CO2: 24 mmol/L (ref 22–32)
Calcium: 9.4 mg/dL (ref 8.9–10.3)
Chloride: 109 mmol/L (ref 98–111)
Creatinine, Ser: 1.25 mg/dL — ABNORMAL HIGH (ref 0.61–1.24)
GFR, Estimated: >60 mL/min (ref >60)
Glucose, Bld: 155 mg/dL — ABNORMAL HIGH (ref 70–99)
Potassium: 4.1 mmol/L (ref 3.5–5.1)
Sodium: 140 mmol/L (ref 135–145)

## 2022-03-28 LAB — RESP PANEL BY RT-PCR (RSV, FLU A&B, COVID)  RVPGX2
Influenza A by PCR: NEGATIVE
Influenza B by PCR: NEGATIVE
Resp Syncytial Virus by PCR: NEGATIVE
SARS Coronavirus 2 by RT PCR: NEGATIVE

## 2022-03-28 LAB — CBG MONITORING, ED: Glucose-Capillary: 148 mg/dL — ABNORMAL HIGH (ref 70–99)

## 2022-03-28 LAB — HEPATIC FUNCTION PANEL
ALT: 14 U/L (ref 0–44)
AST: 17 U/L (ref 15–41)
Albumin: 3.6 g/dL (ref 3.5–5.0)
Alkaline Phosphatase: 42 U/L (ref 38–126)
Bilirubin, Direct: <0.1 mg/dL (ref 0.0–0.2)
Total Bilirubin: 0.5 mg/dL (ref 0.3–1.2)
Total Protein: 6 g/dL — ABNORMAL LOW (ref 6.5–8.1)

## 2022-03-28 LAB — CBC
HCT: 27.5 % — ABNORMAL LOW (ref 39.0–52.0)
Hemoglobin: 8.5 g/dL — ABNORMAL LOW (ref 13.0–17.0)
MCH: 27.9 pg (ref 26.0–34.0)
MCHC: 30.9 g/dL (ref 30.0–36.0)
MCV: 90.2 fL (ref 80.0–100.0)
Platelets: 163 10*3/uL (ref 150–400)
RBC: 3.05 MIL/uL — ABNORMAL LOW (ref 4.22–5.81)
RDW: 15.1 % (ref 11.5–15.5)
WBC: 4.1 10*3/uL (ref 4.0–10.5)
nRBC: 0 % (ref 0.0–0.2)

## 2022-03-28 LAB — LIPASE, BLOOD: Lipase: 34 U/L (ref 11–51)

## 2022-03-28 LAB — POC OCCULT BLOOD, ED: Fecal Occult Bld: NEGATIVE

## 2022-03-28 MED ORDER — LACTATED RINGERS IV BOLUS
1000.0000 mL | Freq: Once | INTRAVENOUS | Status: AC
  Administered 2022-03-28: 1000 mL via INTRAVENOUS

## 2022-03-28 MED ORDER — IOHEXOL 350 MG/ML SOLN
75.0000 mL | Freq: Once | INTRAVENOUS | Status: AC | PRN
  Administered 2022-03-28: 75 mL via INTRAVENOUS

## 2022-03-28 NOTE — ED Notes (Signed)
Orthostatics:  Lying: HR 73 BP 110/70 Sitting: HR 75 BP 107/74 Standing: HR 86 BP 112/68

## 2022-03-28 NOTE — ED Notes (Signed)
Called patient for room. No response.

## 2022-03-28 NOTE — Telephone Encounter (Signed)
Patient is still experiencing shortness of breath since hospital follow-up On 12.6.23  Sent call to Access Nurse for further assessment

## 2022-03-28 NOTE — ED Triage Notes (Signed)
Pt BIB [facility] from home c/o dizziness that has gotten worse over the last 2 weeks. Pt was evaluated on the onset of dizziness 2 weeks ago and had to get a blood transfusion.

## 2022-03-28 NOTE — Discharge Instructions (Signed)
It was a pleasure taking care of you today.  As discussed, all of your labs were reassuring.  Your CT scan did not show any further blood clots.  Continue taking your blood thinner as prescribed.  Your blood pressure was slightly lower today. Stop taking Imdur and recheck BP. Follow-up with PCP within the next few days to discussed BP and medications. Return to the ER for new or worsening symptoms.

## 2022-03-28 NOTE — ED Provider Triage Note (Addendum)
Emergency Medicine Provider Triage Evaluation Note  [name] Cook , a 71 y.o. male  was evaluated in triage.  Pt complains of shortness of breath that is been ongoing since his release from the hospital.  Recent admission due to anemia, had experienced melena and was transfused 4 pints of blood.  Today's visit he endorses shortness of breath at rest, exacerbated with any ambulation and movement.  He denies any rectal bleeding.  He does report some chest pain, and dizziness which is happening intermittently.  No alleviating or exacerbating factors.  No tobacco use.  No prior history of CAD.  Review of Systems  Positive: Sob,cp, dizziness Negative: Fever, cough  Physical Exam  SpO2 96%  Gen:   Awake, no distress   Resp:  Normal effort  MSK:   Moves extremities without difficulty  Other:  Lungs are diminished to auscultation bilaterally.  Medical Decision Making  Medically screening exam initiated at 2:14 PM.  Appropriate orders placed.  DOYEL MULKERN was informed that the remainder of the evaluation will be completed by another provider, this initial triage assessment does not replace that evaluation, and the importance of remaining in the ED until their evaluation is complete.  Soft BP in triage but appears stable, will mark as next to go back.    Janeece Fitting, PA-C 03/28/22 1418    Janeece Fitting, PA-C 03/28/22 1418

## 2022-03-28 NOTE — ED Provider Notes (Signed)
[facility] EMERGENCY DEPARTMENT Provider Note   CSN: [id number] Arrival date & time: 03/28/22  1407     History  Chief Complaint  Patient presents with   Dizziness    RODGER GIANGREGORIO is a 71 y.o. male with a past medical history significant for persistent A-fib status post PPM, CAD, hypertension, type 2 diabetes who presents to the ED due to dizziness and shortness of breath that has been persistent since his discharge from the hospital on 11/26.  Patient states dizziness typically occurs when he goes from sitting to standing and when he is walking.  He describes it as feeling off balance and feels like his legs are giving out.  He notes he feels lightheaded and feels as if he is about to pass out.  No syncopal episodes.  he was recently admitted to the hospital for a GI bleed and PE.  Patient has been compliant with his Eliquis.  Denies any further melena.  He admits to some chest pain associated with his shortness of breath.  Also admits to intermittent abdominal pain.  No further bleeding since discharge.  History obtained from patient and past medical records. No interpreter used during encounter.       Home Medications Prior to Admission medications   Medication Sig Start Date End Date Taking? Authorizing Provider  albuterol (VENTOLIN HFA) 108 (90 Base) MCG/ACT inhaler INHALE 2 PUFFS BY MOUTH EVERY 4 HOURS AS NEEDED FOR WHEEZE OR FOR SHORTNESS OF BREATH 01/06/22   [name], NP  apixaban (ELIQUIS) 5 MG TABS tablet Take 1 tablet (5 mg total) by mouth 2 (two) times daily. 04/10/22   Geradine Girt, DO  APIXABAN Arne Cleveland) VTE STARTER PACK (10MG AND 5MG) Take as directed on package: start with two-55m tablets twice daily for 7 days. On day 8, switch to one-5107mtablet twice daily. 03/11/22   VaGeradine GirtDO  Ascorbic Acid (VITAMIN C) 1000 MG tablet Take 2,000 mg by mouth daily.    [provider]  blood glucose meter kit and supplies KIT Dispense  based on patient and insurance preference. Use up to four times daily as directed. (FOR ICD-9 250.00, 250.01). 04/08/21   ClPleas KochNP  budesonide-formoterol ([facility]5 MCG/ACT inhaler INHALE 2 PUFFS INTO THE LUNGS TWICE A DAY 01/12/21   ClPleas KochNP  fish oil-omega-3 fatty acids 1000 MG capsule Take 2 g by mouth daily.    [provider]  fluticasone (FLONASE) 50 MCG/ACT nasal spray PLACE 1 SPRAY INTO BOTH NOSTRILS DAILY AS NEEDED FOR ALLERGIES OR RHINITIS. 06/23/21   ClPleas KochNP  gabapentin (NEURONTIN) 300 MG capsule TAKE 1 CAPSULE BY MOUTH EVERY MORNING AND 2 CAPSULES BY MOUTH AT NIGHT FOR NEUROPATHY 11/18/21   ClPleas KochNP  glipiZIDE (GLUCOTROL XL) 10 MG 24 hr tablet TAKE 1 TABLET (10 MG TOTAL) BY MOUTH DAILY WITH BREAKFAST. FOR DIABETES. 12/05/21   ClPleas KochNP  glucose blood test strip 1 each by Other route as needed for other. Use to check blood sugar up to three times a day-Accu-Chek meter    [provider]  isosorbide mononitrate (IMDUR) 30 MG 24 hr tablet Take 1 tablet (30 mg total) by mouth daily. 03/15/22   LeImogene BurnPA-C  JARDIANCE 25 MG TABS tablet TAKE 1 TABLET (25 MG TOTAL) BY MOUTH DAILY BEFORE BREAKFAST. FOR DIABETES. 12/22/21   ClPleas KochNP  lisinopril (ZESTRIL) 2.5 MG tablet TAKE  1 TABLET BY MOUTH EVERY DAY 07/11/21   [name], MD  nitroGLYCERIN (NITROSTAT) 0.4 MG SL tablet Place 1 tablet (0.4 mg total) under the tongue every 5 (five) minutes as needed for chest pain. 04/08/21   [name], NP  pantoprazole (PROTONIX) 40 MG tablet Take 1 tablet (40 mg total) by mouth daily. 03/12/22 06/10/22  Geradine Girt, DO  rosuvastatin (CRESTOR) 20 MG tablet TAKE 1 TABLET BY MOUTH EVERY DAY IN THE EVENING FOR CHOLESTEROL 11/24/21   [name], NP  sertraline (ZOLOFT) 50 MG tablet TAKE 1 TABLET (50 MG TOTAL) BY MOUTH DAILY. FOR ANXIETY AND DEPRESSION. 01/06/22   [name], NP   tamsulosin (FLOMAX) 0.4 MG CAPS capsule TAKE 1 CAPSULE (0.4 MG TOTAL) BY MOUTH 2 (TWO) TIMES DAILY. 01/12/21   Noreene Filbert, MD  TYLENOL 500 MG tablet Take 500-1,000 mg by mouth every 6 (six) hours as needed for mild pain or headache.    [provider]      Allergies    Januvia [sitagliptin] and Metformin and related    Review of Systems   Review of Systems  Constitutional:  Negative for chills and fever.  Respiratory:  Positive for shortness of breath. Negative for cough.   Cardiovascular:  Positive for chest pain. Negative for leg swelling.  Gastrointestinal:  Positive for abdominal pain. Negative for anal bleeding, blood in stool, diarrhea, nausea and vomiting.  Genitourinary:  Negative for dysuria.  Neurological:  Positive for dizziness and light-headedness.  All other systems reviewed and are negative.   Physical Exam Updated Vital Signs BP 126/71   Pulse 82   Temp 99.3 F (37.4 C) (Oral)   Resp 12   SpO2 100%  Physical Exam Vitals and nursing note reviewed.  Constitutional:      General: He is not in acute distress.    Appearance: He is not ill-appearing.  HENT:     Head: Normocephalic.  Eyes:     Pupils: Pupils are equal, round, and reactive to light.  Cardiovascular:     Rate and Rhythm: Normal rate and regular rhythm.     Pulses: Normal pulses.     Heart sounds: Normal heart sounds. No murmur heard.    No friction rub. No gallop.  Pulmonary:     Effort: Pulmonary effort is normal.     Breath sounds: Normal breath sounds.  Abdominal:     General: Abdomen is flat. There is no distension.     Palpations: Abdomen is soft.     Tenderness: There is abdominal tenderness. There is no guarding or rebound.     Comments: Diffuse tenderness. No rebound or guarding  Genitourinary:    Comments: Rectal exam performed with chaperone in room. Light brown stool. No melena.  Musculoskeletal:        General: Normal range of motion.     Cervical back: Neck  supple.  Skin:    General: Skin is warm and dry.  Neurological:     General: No focal deficit present.     Mental Status: He is alert.     Comments: Speech is clear, able to follow commands CN III-XII intact Normal strength in upper and lower extremities bilaterally including dorsiflexion and plantar flexion, strong and equal grip strength Sensation grossly intact throughout Moves extremities without ataxia, coordination intact No pronator drift   Psychiatric:        Mood and Affect: Mood normal.        Behavior:  Behavior normal.     ED Results / Procedures / Treatments   Labs (all labs ordered are listed, but only abnormal results are displayed) Labs Reviewed  BASIC METABOLIC PANEL - Abnormal; Notable for the following components:      Result Value   Glucose, Bld 155 (*)    Creatinine, Ser 1.25 (*)    All other components within normal limits  CBC - Abnormal; Notable for the following components:   RBC 3.05 (*)    Hemoglobin 8.5 (*)    HCT 27.5 (*)    All other components within normal limits  PROTIME-INR - Abnormal; Notable for the following components:   Prothrombin Time 15.9 (*)    INR 1.3 (*)    All other components within normal limits  HEPATIC FUNCTION PANEL - Abnormal; Notable for the following components:   Total Protein 6.0 (*)    All other components within normal limits  CBG MONITORING, ED - Abnormal; Notable for the following components:   Glucose-Capillary 148 (*)    All other components within normal limits  RESP PANEL BY RT-PCR (RSV, FLU A&B, COVID)  RVPGX2  LIPASE, BLOOD  URINALYSIS, ROUTINE W REFLEX MICROSCOPIC  POC OCCULT BLOOD, ED  TROPONIN I (HIGH SENSITIVITY)  TROPONIN I (HIGH SENSITIVITY)    EKG None  Radiology CT Angio Chest PE W and/or Wo Contrast  Result Date: 03/28/2022 CLINICAL DATA:  Pulmonary embolism (PE) suspected, high prob Dizziness. EXAM: CT ANGIOGRAPHY CHEST WITH CONTRAST TECHNIQUE: Multidetector CT imaging of the chest was  performed using the standard protocol during bolus administration of intravenous contrast. Multiplanar CT image reconstructions and MIPs were obtained to evaluate the vascular anatomy. RADIATION DOSE REDUCTION: This exam was performed according to the departmental dose-optimization program which includes automated exposure control, adjustment of the mA and/or kV according to patient size and/or use of iterative reconstruction technique. CONTRAST:  11m OMNIPAQUE IOHEXOL 350 MG/ML SOLN COMPARISON:  Radiograph earlier today. Chest CTA 3 weeks ago 03/08/2022 FINDINGS: Cardiovascular: Previous right lower lobe pulmonary emboli have resolved. No residual or persistent thrombus. No new pulmonary emboli. Atherosclerosis and tortuosity of the thoracic aorta. No minimal contrast in the aorta, no obvious acute aortic findings. Pacemaker leads in the right atrium and ventricle. Coronary artery calcifications. No pericardial effusion. Mediastinum/Nodes: No enlarged mediastinal or hilar lymph nodes. Decompressed esophagus. Lungs/Pleura: Mild apical emphysema. Mild central bronchial thickening. No acute airspace disease. No pleural fluid. No features of pulmonary edema. No endobronchial lesion or debris. Upper Abdomen: Layering gallstones. No acute upper abdominal findings. Musculoskeletal: No acute osseous findings or focal osseous lesions. Unremarkable chest wall soft tissues. Review of the MIP images confirms the above findings. IMPRESSION: 1. Previous right lower lobe pulmonary emboli have resolved. No residual or persistent thrombus. No new pulmonary emboli. 2. No acute intrathoracic abnormality. 3. Aortic atherosclerosis.  Coronary artery calcifications. 4. Incidental cholelithiasis. Aortic Atherosclerosis (ICD10-I70.0) and Emphysema (ICD10-J43.9). Electronically Signed   By: MKeith RakeM.D.   On: 03/28/2022 18:40   DG Chest 1 View  Result Date: 03/28/2022 CLINICAL DATA:  Shortness of breath, dizziness EXAM:  CHEST  1 VIEW COMPARISON:  03/08/2022 FINDINGS: Cardiac size is within normal limits. There are no signs of pulmonary edema or focal pulmonary consolidation. There is no pleural effusion or pneumothorax. Pacemaker battery is seen in left infraclavicular region. IMPRESSION: There are no signs of pulmonary edema or focal pulmonary consolidation. Electronically Signed   By: PElmer PickerM.D.   On: 03/28/2022 15:32  Procedures Procedures    Medications Ordered in ED Medications  lactated ringers bolus 1,000 mL (0 mLs Intravenous Stopped 03/28/22 1727)  iohexol (OMNIPAQUE) 350 MG/ML injection 75 mL (75 mLs Intravenous Contrast Given 03/28/22 1832)    ED Course/ Medical Decision Making/ A&P Clinical Course as of 03/28/22 2001  Tue Mar 28, 2022  1630 Fecal Occult Blood, POC: NEGATIVE [CA]    Clinical Course User Index [CA] Suzy Bouchard, PA-C                           Medical Decision Making Amount and/or Complexity of Data Reviewed External Data Reviewed: notes.    Details: Recent admission notes Labs: ordered. Decision-making details documented in ED Course. Radiology: ordered and independent interpretation performed. Decision-making details documented in ED Course. ECG/medicine tests: ordered and independent interpretation performed. Decision-making details documented in ED Course.  Risk Prescription drug management.   This patient presents to the ED for concern of dizziness & SOB, this involves an extensive number of treatment options, and is a complaint that carries with it a high risk of complications and morbidity.  The differential diagnosis includes PE, pneumonia, symptomatic anemia, CVA, etc  71 year old male presents to the ED due to persistent dizziness, shortness of breath, chest pain, and abdominal pain ever since being discharged from the hospital on 11/26.  Patient admitted the hospital for GI bleed and PE.  Patient has been compliant with his Eliquis.   History of persistent A-fib with a pacemaker.  Patient describes dizziness as feeling off balance when he is walking typically going from sitting to standing.  No infectious symptoms.  Denies nausea, vomiting, and diarrhea.  Upon arrival, patient afebrile and borderline hypotensive at 95/65.  Patient in no acute distress.  Reassuring physical exam.  Abdomen soft, nondistended with diffuse tenderness.  No rebound or guarding.  Normal neurological exam without any deficits.  Rectal exam performed with chaperone in room with light brown stool.  No melanotic stool.  Fecal occult negative.  Routine labs ordered.  Will obtain CTA chest to rule out worsening PE to indicate failed p.o. Eliquis treatment.  1 L LR given for soft BP.  X-ray to rule out evidence of pneumonia.  Lower suspicion for acute abdomen given no focal tenderness.   CBC reassuring.  No leukocytosis.  Anemia with hemoglobin at 8.5 appears around patient's baseline.  Fecal occult negative.  Low suspicion for GI bleed.  BMP significant for hyperglycemia at 155.  No anion gap.  Doubt DKA.  No major electrolyte derangements.  Normal LFTs.  COVID/influenza negative.  Lipase normal at 34.  Chest x-ray personally reviewed and interpreted which is negative for signs of pneumonia, pneumothorax or widened mediastinum.  EKG demonstrates ventricular paced rhythm.  No evidence of acute ischemia.  Delta troponin flat.  Low suspicion for ACS.  Orthostatic vitals obtained here in the ED.  No evidence of orthostatic hypotension.  BP improved after 1 L LR. CTA negative for PE. Does show gallstones. No RUQ tenderness to suggest acute cholecystitis.  Dr. Doren Custard evaluated patient at bedside and performed orthostatic vitals again. Patient was mildly orthostatic at this time. Dr. Doren Custard notes patient is stable for discharge; however recommends hydration at home and to stop taking imdur and recheck BP at home to see if it is improved. Follow-up with PCP for BP recheck and to  discuss BP medications. Dizziness and SOB possibly multifactioral from polypharmacy and anemia. No evidence of  GI bleed today. Hemoglobin stable from 6 days ago. Patient able to ambulate in the ED without difficulty. Strict ED precautions discussed with patient. Patient states understanding and agrees to plan. Patient discharged home in no acute distress and stable vitals.       Final Clinical Impression(s) / ED Diagnoses Final diagnoses:  Dizziness  Shortness of breath    Rx / DC Orders ED Discharge Orders     None         Karie Kirks 03/28/22 2001    Godfrey Pick, MD 03/29/22 [phone]

## 2022-03-28 NOTE — Telephone Encounter (Signed)
[facility] - Client TELEPHONE ADVICE RECORD [facility] Patient Name: [name] Cook Gender: Male DOB: 1950/05/04 Age: 71 Y 10 M 12 D Return Phone Number: [phone] (Primary) Address: City/ State/ Zip: [place] [address] Client [facility] - Client Client Site [facility] - Day Provider Alma Friendly - NP Contact Type Call Who Is Calling Patient / Member / Family / Caregiver Call Type Triage / Clinical Relationship To Patient Self Return Phone Number [phone] (Primary) Chief Complaint BREATHING - shortness of breath or sounds breathless Reason for Call Symptomatic / Request for Health Information Initial Comment Caller was transferred from the office, patient is still experiencing shortness of breath. Translation No Nurse Assessment Nurse: Nicki Reaper, RN, Malachy Mood Date/Time (Eastern Time): 03/28/2022 12:41:09 PM Confirm and document reason for call. If symptomatic, describe symptoms. ---Caller states he has SOB that started since he was discharged from hospital 2 weeks ago for anemia, Rx for Equinox 5 mg bid, med given, has CP at times with SOB, has dizziness when getting up, denies cough, fever and other symptoms, is using albuterol inhaler and last used it last night Does the patient have any new or worsening symptoms? ---Yes Will a triage be completed? ---Yes Related visit to physician within the last 2 weeks? ---Yes Does the PT have any chronic conditions? (i.e. diabetes, asthma, this includes High risk factors for pregnancy, etc.) ---Yes List chronic conditions. ---HTN DM COPD Is this a behavioral health or substance abuse call? ---No Guidelines Guideline Title Affirmed Question Affirmed Notes Nurse Date/Time (Eastern Time) COPD PostHospitalization Follow-up Call SEVERE difficulty breathing (e.g., struggling for each breath, speaks in single words, pulse > 120) Scott, RN,  Malachy Mood 03/28/2022 12:45:00 PM PLEASE NOTE: All timestamps contained within this report are represented as [place] Standard Time. CONFIDENTIALTY NOTICE: This fax transmission is intended only for the addressee. It contains information that is legally privileged, confidential or otherwise protected from use or disclosure. If you are not the intended recipient, you are strictly prohibited from reviewing, disclosing, copying using or disseminating any of this information or taking any action in reliance on or regarding this information. If you have received this fax in error, please notify us immediately by telephone so that we can arrange for its return to [place]. Phone: [phone], Toll-Free: [phone], Fax: [phone] Page: 2 of 2 Call Id: [id number] [place]. Time Eilene Ghazi Time) Disposition Final User 03/28/2022 12:39:33 PM Send to Urgent Clarnce Flock 03/28/2022 12:45:48 PM Call EMS 911 Now Yes Nicki Reaper, RNMalachy Mood 03/28/2022 12:51:18 PM 911 Outcome Documentation Nicki Reaper, RN, Malachy Mood Reason: received voicemail Final Disposition 03/28/2022 12:45:48 PM Call EMS 911 Now Yes Nicki Reaper, RN, Erskine Speed Disagree/Comply Comply Caller Understands Yes PreDisposition Call Doctor Care Advice Given Per Guideline CALL EMS 911 NOW: * Immediate medical attention is needed. You need to hang up and call 911 (or an ambulance). * Triager Discretion: I'll call you back in a few minutes to be sure you were able to reach them

## 2022-03-28 NOTE — Telephone Encounter (Signed)
Noted and agree. 

## 2022-03-28 NOTE — ED Notes (Signed)
Pt also reports when taking a deep breath he feels a shooting pain up through his head.

## 2022-03-30 ENCOUNTER — Telehealth: Payer: Self-pay

## 2022-03-30 LAB — HM DIABETES EYE EXAM

## 2022-03-30 NOTE — Chronic Care Management (AMB) (Signed)
Chronic Care Management Pharmacy Assistant   Name: [name] Cook  MRN: [id number] DOB: 07/03/1950  Reason for Encounter: General Adherence   Recent office visits:  03/22/22-[name] Clark,NP(PCP)-hospital f/u-Continue Eliquis 5 mg twice daily for at least 3 months. Proceed with watchman procedure if applicable. Future labs.  Recent consult visits:  03/15/22-[name] Lenze,PA(cardio)-f/u CAD,stop Plavix completely. .Discuss possible candidate for watchman.stopped lasix, Decrease dose of Imdur to 24m 1 tablet daily -f/u 1-2 months   Hospital visits:  None in previous 6 months  03/28/22-[name] Dixon,MD([facility])-dizziness Dr. DDoren Custardevaluated patient at bedside and performed orthostatic vitals again. Patient was mildly orthostatic at this time. Dr. DDoren Custardnotes patient is stable for discharge; however recommends hydration at home and to stop taking imdur and recheck BP at home to see if it is improved. Follow-up with PCP for BP recheck and to discuss BP medications. Dizziness and SOB possibly multifactioral from polypharmacy and anemia. No evidence of GI bleed today. Hemoglobin stable from 6 days ago. Patient able to ambulate in the ED without difficulty. Strict ED precautions discussed with patient. Patient states understanding and agrees to plan. Patient discharged home in no acute distress and stable vitals.   Medications: Outpatient Encounter Medications as of 03/30/2022  Medication Sig   albuterol (VENTOLIN HFA) 108 (90 Base) MCG/ACT inhaler INHALE 2 PUFFS BY MOUTH EVERY 4 HOURS AS NEEDED FOR WHEEZE OR FOR SHORTNESS OF BREATH   [START ON 04/10/2022] apixaban (ELIQUIS) 5 MG TABS tablet Take 1 tablet (5 mg total) by mouth 2 (two) times daily.   APIXABAN (ELIQUIS) VTE STARTER PACK (10MG AND 5MG) Take as directed on package: start with two-5440mtablets twice daily for 7 days. On day 8, switch to one-40m79mablet twice daily.   Ascorbic Acid (VITAMIN C) 1000 MG tablet Take 2,000 mg by mouth  daily.   blood glucose meter kit and supplies KIT Dispense based on patient and insurance preference. Use up to four times daily as directed. (FOR ICD-9 250.00, 250.01).   budesonide-formoterol (SYMBICORT) 160-4.5 MCG/ACT inhaler INHALE 2 PUFFS INTO THE LUNGS TWICE A DAY   fish oil-omega-3 fatty acids 1000 MG capsule Take 2 g by mouth daily.   fluticasone (FLONASE) 50 MCG/ACT nasal spray PLACE 1 SPRAY INTO BOTH NOSTRILS DAILY AS NEEDED FOR ALLERGIES OR RHINITIS.   gabapentin (NEURONTIN) 300 MG capsule TAKE 1 CAPSULE BY MOUTH EVERY MORNING AND 2 CAPSULES BY MOUTH AT NIGHT FOR NEUROPATHY   glipiZIDE (GLUCOTROL XL) 10 MG 24 hr tablet TAKE 1 TABLET (10 MG TOTAL) BY MOUTH DAILY WITH BREAKFAST. FOR DIABETES.   glucose blood test strip 1 each by Other route as needed for other. Use to check blood sugar up to three times a day-Accu-Chek meter   isosorbide mononitrate (IMDUR) 30 MG 24 hr tablet Take 1 tablet (30 mg total) by mouth daily.   JARDIANCE 25 MG TABS tablet TAKE 1 TABLET (25 MG TOTAL) BY MOUTH DAILY BEFORE BREAKFAST. FOR DIABETES.   lisinopril (ZESTRIL) 2.5 MG tablet TAKE 1 TABLET BY MOUTH EVERY DAY   nitroGLYCERIN (NITROSTAT) 0.4 MG SL tablet Place 1 tablet (0.4 mg total) under the tongue every 5 (five) minutes as needed for chest pain.   pantoprazole (PROTONIX) 40 MG tablet Take 1 tablet (40 mg total) by mouth daily.   rosuvastatin (CRESTOR) 20 MG tablet TAKE 1 TABLET BY MOUTH EVERY DAY IN THE EVENING FOR CHOLESTEROL   sertraline (ZOLOFT) 50 MG tablet TAKE 1 TABLET (50 MG TOTAL) BY MOUTH DAILY. FOR  ANXIETY AND DEPRESSION.   tamsulosin (FLOMAX) 0.4 MG CAPS capsule TAKE 1 CAPSULE (0.4 MG TOTAL) BY MOUTH 2 (TWO) TIMES DAILY.   TYLENOL 500 MG tablet Take 500-1,000 mg by mouth every 6 (six) hours as needed for mild pain or headache.   No facility-administered encounter medications on file as of 03/30/2022.     Recent vitals BP Readings from Last 3 Encounters:  03/28/22 120/72  03/22/22 (!)  110/58  03/20/22 (!) 102/56   Pulse Readings from Last 3 Encounters:  03/28/22 80  03/22/22 70  03/20/22 88   Wt Readings from Last 3 Encounters:  03/22/22 216 lb (98 kg)  03/20/22 214 lb (97.1 kg)  03/15/22 212 lb (96.2 kg)   BMI Readings from Last 3 Encounters:  03/22/22 30.13 kg/m  03/20/22 29.85 kg/m  03/15/22 29.57 kg/m    Recent lab results    Component Value Date/Time   NA 140 03/28/2022 1433   NA 139 06/23/2019 1106   NA 141 01/23/2013 0151   K 4.1 03/28/2022 1433   K 3.4 (L) 01/23/2013 0151   CL 109 03/28/2022 1433   CL 107 01/23/2013 0151   CO2 24 03/28/2022 1433   CO2 26 01/23/2013 0151   GLUCOSE 155 (H) 03/28/2022 1433   GLUCOSE 97 01/23/2013 0151   BUN 21 03/28/2022 1433   BUN 22 06/23/2019 1106   BUN 23 (H) 01/23/2013 0151   CREATININE 1.25 (H) 03/28/2022 1433   CREATININE 1.06 05/27/2021 1515   CALCIUM 9.4 03/28/2022 1433   CALCIUM 8.4 (L) 01/23/2013 0151    Lab Results  Component Value Date   CREATININE 1.25 (H) 03/28/2022   GFR 71.64 10/26/2021   GFRNONAA >60 03/28/2022   GFRAA 72 06/23/2019   Lab Results  Component Value Date/Time   HGBA1C 6.4 (H) 03/08/2022 06:00 PM   HGBA1C 7.5 (H) 10/26/2021 12:43 PM   HGBA1C 6.4 (H) 11/27/2016 11:33 AM   HGBA1C 7.1 (H) 08/21/2016 10:25 AM   MICROALBUR <0.7 09/21/2017 01:06 PM   MICROALBUR <0.2 11/27/2016 11:33 AM    Lab Results  Component Value Date   CHOL 132 10/26/2021   HDL 50.10 10/26/2021   LDLCALC 49 10/26/2021   TRIG 166.0 (H) 10/26/2021   CHOLHDL 3 10/26/2021     Contacted [name] Cook on 03/30/22  for general disease state and medication adherence call.    What concerns do you have about your medications? Confirmed stopping Indur since last ED visit. Has not started to take BP. Encouraged him to start taking BP and have some readings for all of his F/U appointments scheduled through January 2024.Reviewed all appointments with the patient .  The patient denies side effects with  their medications.   How often do you forget or accidentally miss a dose? Rarely- during hospital stay.  Do you use a pillbox? Yes  Are you having any problems getting your medications from your pharmacy? No  Has the cost of your medications been a concern? No  Since last visit with CPP, the following interventions have been made.   03/15/22-cardiology stopped Plavix completely .Discuss possible candidate for watchman.stopped lasix.  Stopped Imdur  The patient has had an ED visit since last contact. 03/18/22-[facility]-Dizziness Dr. Doren Cook evaluated patient at bedside and performed orthostatic vitals again. Patient was mildly orthostatic at this time. Dr. Doren Cook notes patient is stable for discharge; however recommends hydration at home and to stop taking imdur and recheck BP at home to see if it is improved.  Follow-up with PCP for BP recheck and to discuss BP medications. Dizziness and SOB possibly multifactioral from polypharmacy and anemia. No evidence of GI bleed today. Hemoglobin stable from 6 days ago. Patient able to ambulate in the ED without difficulty. Strict ED precautions discussed with patient. Patient states understanding and agrees to plan. Patient discharged home in no acute distress and stable vitals.   The patient denies problems with their health. Patient reports feeling well with no episodes of dizziness today .  Patient denies concerns or questions for Charlene Brooke, PharmD at this time.   Counseled patient on:  [place] job taking medications, Importance of taking medication daily without missed doses, Benefits of adherence packaging or a pillbox, and Access to CCM team for any cost, medication or pharmacy concerns.   Care Gaps: Annual wellness visit in last year? Yes Most Recent BP reading: 110/66  If Diabetic: Last eye exam / retinopathy screening:2022 Last diabetic foot exam:UTD Last UACR:  unknown  Star Rating Drugs:  Medication:  Last Fill: Day  Supply Glipizide 70m 03/07/22 90 Jardiance 2106m12/3/23 90 Lisinopril 2.71m49m0/18/23 90 Rosuvastatin 41m47m22/23 90  Summary of recommendations from last CCM [place] (Date:03/14/22) Summary: CCM F/U visit -Reviewed recent hospital notes for GI bleeding/acute PE. Pt affirms compliance with new Eliquis (currently on 10 mg BID, will transition to 5 mg BID according to package instructions after 7 days); he is also still taking clopidogrel, though discharge notes say to hold antiplatelets until follow up with cardiology -Reviewed role of clopidogrel in s/o of anticoagulation and recent GI bleed - given stent was > 12 months ago it would be reasonable to discontinue clopidogrel and continue Eliquis alone or with aspirin, advised to discuss with cardiology at appt 11/29 -Reviewed role of fish oil. TRIG are slightly elevated (166 on 10/2021) but given antiplatelet effects of fish oil risks and recent GI bleeding risks likely outweigh benefits   Recommendations/Changes made from today's visit: -Consider d/c clopidogrel - follow up with cardiology as scheduled. Routing note to cardiology -Stop fish oil  Upcoming appointments: PCP appointment on 04/28/22, CCM appointment on 05/11/22, and Cardiology appointment on 04/25/22, 05/01/22    LindCharlene BrookeP notified  VelmAvel [phone]

## 2022-04-07 ENCOUNTER — Other Ambulatory Visit: Payer: Self-pay

## 2022-04-07 ENCOUNTER — Emergency Department (HOSPITAL_COMMUNITY): Payer: Medicare Other

## 2022-04-07 ENCOUNTER — Encounter (HOSPITAL_COMMUNITY): Payer: Self-pay

## 2022-04-07 ENCOUNTER — Emergency Department (HOSPITAL_COMMUNITY)
Admission: EM | Admit: 2022-04-07 | Discharge: 2022-04-08 | Disposition: A | Discharge status: Home / Self Care | Payer: Medicare Other | Attending: Emergency Medicine | Admitting: Emergency Medicine

## 2022-04-07 DIAGNOSIS — Z950 Presence of cardiac pacemaker: Secondary | ICD-10-CM | POA: Insufficient documentation

## 2022-04-07 DIAGNOSIS — I4891 Unspecified atrial fibrillation: Secondary | ICD-10-CM | POA: Diagnosis not present

## 2022-04-07 DIAGNOSIS — Z79899 Other long term (current) drug therapy: Secondary | ICD-10-CM | POA: Insufficient documentation

## 2022-04-07 DIAGNOSIS — K8020 Calculus of gallbladder without cholecystitis without obstruction: Secondary | ICD-10-CM | POA: Diagnosis not present

## 2022-04-07 DIAGNOSIS — I10 Essential (primary) hypertension: Secondary | ICD-10-CM | POA: Diagnosis not present

## 2022-04-07 DIAGNOSIS — E119 Type 2 diabetes mellitus without complications: Secondary | ICD-10-CM | POA: Insufficient documentation

## 2022-04-07 DIAGNOSIS — R58 Hemorrhage, not elsewhere classified: Secondary | ICD-10-CM | POA: Diagnosis not present

## 2022-04-07 DIAGNOSIS — Z86711 Personal history of pulmonary embolism: Secondary | ICD-10-CM | POA: Diagnosis not present

## 2022-04-07 DIAGNOSIS — I2510 Atherosclerotic heart disease of native coronary artery without angina pectoris: Secondary | ICD-10-CM | POA: Diagnosis not present

## 2022-04-07 DIAGNOSIS — Z7901 Long term (current) use of anticoagulants: Secondary | ICD-10-CM | POA: Diagnosis not present

## 2022-04-07 DIAGNOSIS — Z955 Presence of coronary angioplasty implant and graft: Secondary | ICD-10-CM | POA: Diagnosis not present

## 2022-04-07 DIAGNOSIS — R0602 Shortness of breath: Secondary | ICD-10-CM | POA: Diagnosis not present

## 2022-04-07 DIAGNOSIS — R531 Weakness: Secondary | ICD-10-CM

## 2022-04-07 DIAGNOSIS — Z7984 Long term (current) use of oral hypoglycemic drugs: Secondary | ICD-10-CM | POA: Diagnosis not present

## 2022-04-07 DIAGNOSIS — R0902 Hypoxemia: Secondary | ICD-10-CM | POA: Diagnosis not present

## 2022-04-07 DIAGNOSIS — R000 Tachycardia, unspecified: Secondary | ICD-10-CM | POA: Diagnosis not present

## 2022-04-07 DIAGNOSIS — D649 Anemia, unspecified: Secondary | ICD-10-CM | POA: Insufficient documentation

## 2022-04-07 DIAGNOSIS — I700 Atherosclerosis of aorta: Secondary | ICD-10-CM | POA: Diagnosis not present

## 2022-04-07 LAB — CBC WITH DIFFERENTIAL/PLATELET
Abs Immature Granulocytes: 0 10*3/uL (ref 0.00–0.07)
Basophils Absolute: 0 10*3/uL (ref 0.0–0.1)
Basophils Relative: 1 %
Eosinophils Absolute: 0.2 10*3/uL (ref 0.0–0.5)
Eosinophils Relative: 5 %
HCT: 26 % — ABNORMAL LOW (ref 39.0–52.0)
Hemoglobin: 7.4 g/dL — ABNORMAL LOW (ref 13.0–17.0)
Immature Granulocytes: 0 %
Lymphocytes Relative: 40 %
Lymphs Abs: 1.6 10*3/uL (ref 0.7–4.0)
MCH: 26.9 pg (ref 26.0–34.0)
MCHC: 28.5 g/dL — ABNORMAL LOW (ref 30.0–36.0)
MCV: 94.5 fL (ref 80.0–100.0)
Monocytes Absolute: 0.5 10*3/uL (ref 0.1–1.0)
Monocytes Relative: 12 %
Neutro Abs: 1.6 10*3/uL — ABNORMAL LOW (ref 1.7–7.7)
Neutrophils Relative %: 42 %
Platelets: 146 10*3/uL — ABNORMAL LOW (ref 150–400)
RBC: 2.75 MIL/uL — ABNORMAL LOW (ref 4.22–5.81)
RDW: 15.8 % — ABNORMAL HIGH (ref 11.5–15.5)
WBC: 3.9 10*3/uL — ABNORMAL LOW (ref 4.0–10.5)
nRBC: 0 % (ref 0.0–0.2)

## 2022-04-07 LAB — COMPREHENSIVE METABOLIC PANEL
ALT: 10 U/L (ref 0–44)
AST: 17 U/L (ref 15–41)
Albumin: 3.5 g/dL (ref 3.5–5.0)
Alkaline Phosphatase: 42 U/L (ref 38–126)
Anion gap: 7 (ref 5–15)
BUN: 24 mg/dL — ABNORMAL HIGH (ref 8–23)
CO2: 19 mmol/L — ABNORMAL LOW (ref 22–32)
Calcium: 8 mg/dL — ABNORMAL LOW (ref 8.9–10.3)
Chloride: 114 mmol/L — ABNORMAL HIGH (ref 98–111)
Creatinine, Ser: 1.21 mg/dL (ref 0.61–1.24)
GFR, Estimated: >60 mL/min (ref >60)
Glucose, Bld: 66 mg/dL — ABNORMAL LOW (ref 70–99)
Potassium: 3.5 mmol/L (ref 3.5–5.1)
Sodium: 140 mmol/L (ref 135–145)
Total Bilirubin: 0.3 mg/dL (ref 0.3–1.2)
Total Protein: 5.1 g/dL — ABNORMAL LOW (ref 6.5–8.1)

## 2022-04-07 LAB — TROPONIN I (HIGH SENSITIVITY)
Troponin I (High Sensitivity): 9 ng/L (ref <18)
Troponin I (High Sensitivity): 7 ng/L (ref <18)

## 2022-04-07 LAB — POC OCCULT BLOOD, ED: Fecal Occult Bld: NEGATIVE

## 2022-04-07 LAB — BRAIN NATRIURETIC PEPTIDE: B Natriuretic Peptide: 80.2 pg/mL (ref 0.0–100.0)

## 2022-04-07 MED ORDER — IOHEXOL 350 MG/ML SOLN
75.0000 mL | Freq: Once | INTRAVENOUS | Status: AC | PRN
  Administered 2022-04-07: 75 mL via INTRAVENOUS

## 2022-04-07 MED ORDER — SODIUM CHLORIDE 0.9% IV SOLUTION: Freq: Once | INTRAVENOUS | Status: DC

## 2022-04-07 MED ORDER — HYDROXYZINE HCL 25 MG PO TABS
25.0000 mg | ORAL_TABLET | Freq: Once | ORAL | Status: AC
  Administered 2022-04-07: 25 mg via ORAL
  Filled 2022-04-07: qty 1

## 2022-04-07 NOTE — ED Provider Notes (Signed)
[facility] EMERGENCY DEPARTMENT Provider Note   CSN: [id number] Arrival date & time:        History {Add pertinent medical, surgical, social history, OB history to HPI:1} No chief complaint on file.   [name] Cook is a 71 y.o. male.  HPI     Home Medications Prior to Admission medications   Medication Sig Start Date End Date Taking? Authorizing Provider  albuterol (VENTOLIN HFA) 108 (90 Base) MCG/ACT inhaler INHALE 2 PUFFS BY MOUTH EVERY 4 HOURS AS NEEDED FOR WHEEZE OR FOR SHORTNESS OF BREATH 01/06/22   [name], NP  apixaban (ELIQUIS) 5 MG TABS tablet Take 1 tablet (5 mg total) by mouth 2 (two) times daily. 04/10/22   Geradine Girt, DO  APIXABAN Arne [place]) VTE STARTER PACK (10MG AND 5MG) Take as directed on package: start with two-37m tablets twice daily for 7 days. On day 8, switch to one-59mtablet twice daily. 03/11/22   VaGeradine GirtDO  Ascorbic Acid (VITAMIN C) 1000 MG tablet Take 2,000 mg by mouth daily.    [provider]  blood glucose meter kit and supplies KIT Dispense based on patient and insurance preference. Use up to four times daily as directed. (FOR ICD-9 250.00, 250.01). 04/08/21   ClPleas KochNP  budesonide-formoterol ([facility]160-4.5 MCG/ACT inhaler INHALE 2 PUFFS INTO THE LUNGS TWICE A DAY 01/12/21   ClPleas KochNP  fish oil-omega-3 fatty acids 1000 MG capsule Take 2 g by mouth daily.    [provider]  fluticasone (FLONASE) 50 MCG/ACT nasal spray PLACE 1 SPRAY INTO BOTH NOSTRILS DAILY AS NEEDED FOR ALLERGIES OR RHINITIS. 06/23/21   ClPleas KochNP  gabapentin (NEURONTIN) 300 MG capsule TAKE 1 CAPSULE BY MOUTH EVERY MORNING AND 2 CAPSULES BY MOUTH AT NIGHT FOR NEUROPATHY 11/18/21   ClPleas KochNP  glipiZIDE (GLUCOTROL XL) 10 MG 24 hr tablet TAKE 1 TABLET (10 MG TOTAL) BY MOUTH DAILY WITH BREAKFAST. FOR DIABETES. 12/05/21   ClPleas KochNP  glucose blood test strip 1 each by  Other route as needed for other. Use to check blood sugar up to three times a day-Accu-Chek meter    [provider]  isosorbide mononitrate (IMDUR) 30 MG 24 hr tablet Take 1 tablet (30 mg total) by mouth daily. 03/15/22   LeImogene BurnPA-C  JARDIANCE 25 MG TABS tablet TAKE 1 TABLET (25 MG TOTAL) BY MOUTH DAILY BEFORE BREAKFAST. FOR DIABETES. 12/22/21   ClPleas KochNP  lisinopril (ZESTRIL) 2.5 MG tablet TAKE 1 TABLET BY MOUTH EVERY DAY 07/11/21   TaEvans LanceMD  nitroGLYCERIN (NITROSTAT) 0.4 MG SL tablet Place 1 tablet (0.4 mg total) under the tongue every 5 (five) minutes as needed for chest pain. 04/08/21   ClPleas KochNP  pantoprazole (PROTONIX) 40 MG tablet Take 1 tablet (40 mg total) by mouth daily. 03/12/22 06/10/22  VaGeradine GirtDO  rosuvastatin (CRESTOR) 20 MG tablet TAKE 1 TABLET BY MOUTH EVERY DAY IN THE EVENING FOR CHOLESTEROL 11/24/21   ClPleas KochNP  sertraline (ZOLOFT) 50 MG tablet TAKE 1 TABLET (50 MG TOTAL) BY MOUTH DAILY. FOR ANXIETY AND DEPRESSION. 01/06/22   ClPleas KochNP  tamsulosin (FLOMAX) 0.4 MG CAPS capsule TAKE 1 CAPSULE (0.4 MG TOTAL) BY MOUTH 2 (TWO) TIMES DAILY. 01/12/21   ChNoreene FilbertMD  TYLENOL 500 MG tablet Take 500-1,000 mg by mouth every 6 (six) hours as  needed for mild pain or headache.    [provider]      Allergies    Januvia [sitagliptin] and Metformin and related    Review of Systems   Review of Systems  Physical Exam Updated Vital Signs There were no vitals taken for this visit. Physical Exam  ED Results / Procedures / Treatments   Labs (all labs ordered are listed, but only abnormal results are displayed) Labs Reviewed - No data to display  EKG None  Radiology No results found.  Procedures Procedures  {Document cardiac monitor, telemetry assessment procedure when appropriate:1}  Medications Ordered in ED Medications - No data to display  ED Course/ Medical Decision  Making/ A&P                           Medical Decision Making  ***  {Document critical care time when appropriate:1} {Document review of labs and clinical decision tools ie heart score, Chads2Vasc2 etc:1}  {Document your independent review of radiology images, and any outside records:1} {Document your discussion with family members, caretakers, and with consultants:1} {Document social determinants of health affecting pt's care:1} {Document your decision making why or why not admission, treatments were needed:1} Final Clinical Impression(s) / ED Diagnoses Final diagnoses:  None    Rx / DC Orders ED Discharge Orders     None

## 2022-04-07 NOTE — ED Triage Notes (Signed)
Pt from home with complaints of generalized weakness x 2 days. Pt states last time this happened he had blood clots. 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 Is on xarelto. Denies numbness tingling, chest pain.

## 2022-04-08 LAB — PREPARE RBC (CROSSMATCH)

## 2022-04-08 NOTE — Discharge Instructions (Addendum)
[name]:  Thank you for allowing [place] to take care of you today.  We hope you begin feeling better soon. You were seen today for weakness and shortness of breath.  You are found to have slightly low blood counts, but not low enough to need admission.  The good news is that your vital signs all look okay, we talk to the admitting hospital team, and they felt that you would be appropriate to receive a unit of blood and then discharged home for outpatient follow-up.  That is our current plan.  I want you to follow-up with your primary doctor within the next handful of days shortly after the holiday.  If you have any worsening of symptoms including chest pain, shortness of breath that does not go away, or any new bleeding or dark-colored stools, please return to the emergency department for repeat evaluation.  To-Do:  Please follow-up with your primary doctor within the next 2-3 days. It is important that you review any labs or imaging results (if any) that you had today with them. Your preliminary imaging results (if any) are attached. Please return to the Emergency Department or call 911 if you experience chest pain, shortness of breath, severe pain, severe fever, altered mental status, or have any reason to think that you need emergency medical care.  Thank you again.  Hope you feel better soon.  Phyllis Ginger, MD Department of Emergency Medicine

## 2022-04-09 LAB — TYPE AND SCREEN
ABO/RH(D): O POS
Antibody Screen: NEGATIVE
Unit division: 0

## 2022-04-09 LAB — BPAM RBC
Blood Product Expiration Date: 202312272359
ISSUE DATE / TIME: 202312230053
Unit Type and Rh: 5100

## 2022-04-12 ENCOUNTER — Encounter: Payer: Self-pay | Admitting: Primary Care

## 2022-04-14 NOTE — Progress Notes (Signed)
Remote pacemaker transmission.   

## 2022-04-14 NOTE — Progress Notes (Deleted)
Cardiology Office Note:    Date:  04/14/2022   ID:  [name] Cook, DOB 1950-06-05, MRN [id number]  PCP:  [name], NP  [facility] Cardiologist:  Candee Furbish, MD Electrophysiologist:  [name], MD { Click to update primary MD,subspecialty MD or APP then REFRESH:1}  *** Referring MD: [name], NP   Chief Complaint:  No chief complaint on file. {Click here for Visit Info    :1}    History of Present Illness:   [name] Cook is a 71 y.o. male with  history of CAD cath 2020 Patent mRCA stent, 100% D1, 99% oastial D1 to 1st Diag lession, mRCA 30%, pRCA 40% medical therapy with long term plavix recommended.  He also has PAF, Pacemaker,    Patient last saw Dr. Marlou Porch 08/2021 with SOB.  He had a low risk NST 09/2021   Patient discharged 03/03/22 after Acute GI bleed on Plavix and coumadin-received 4 untis PRBC's, endo erosive gastropathy. Plavix resumed and coumadin held until clinic f/u. Patient went back to ED 03/08/22 and discharged 03/12/22 with Acute PE and started on eliquis.  They want to know if plavix can be stopped while on eliquis for his PE.     I saw him 03/15/22 and was very weak. I stopped his plavix, lasix and decreased imdur. He has been anemic and in ED 04/07/22 Hgb 7.4 s/p transfusion. Missed appt for watchman 03/31/22.      Past Medical History:  Diagnosis Date   Acute bronchitis with COPD ([place]) 03/07/2021   Arthritis    "knees and lower back" (03/14/2013)   Atrial flutter ([place])    radiofrequency ablation in 2001   CAD (coronary artery disease)    a. Nonobstructive. Cardiac cath in 2001-50% mid RI, normal LM, LAD, RCA b. cath 10/16/2014 95% mid RCA treated with DES, 99% ost D1 medical management due to small aneurysmal segment   Chronic anticoagulation    chronic Coumadin anticoagulation   Chronic obstructive pulmonary disease ([place]) 04/20/2011   Diabetes mellitus, type 2 (HCC)    Elevated lipase 06/23/2021   GERD  (gastroesophageal reflux disease)    Hyperlipidemia    Hypertension    with hypertensive heart disease   Left knee pain 10/25/2017   medial   Obesity    Persistent atrial fibrillation ([place])    recurrent atrial flutter since 2001 s/p DCCVs, multiple failed AADs, h/o tachy-mediated cardiomyopathy   Shortness of breath    "can come on at any time" (03/14/2013)   Sleep apnea    "dx'd; couldn't wear the mask" (03/14/2013)   Symptomatic anemia 03/08/2022   Tobacco abuse    Current Medications: No outpatient medications have been marked as taking for the 04/25/22 encounter (Appointment) with Imogene Burn, PA-C.    Allergies:   Januvia [sitagliptin] and Metformin and related   Social History   Tobacco Use   Smoking status: Former    Packs/day: 1.00    Years: 42.00    Total pack years: 42.00    Types: Cigarettes    Quit date: 12/30/2013    Years since quitting: 8.2   Smokeless tobacco: Never   Tobacco comments:    Former smoker 03/20/22  Vaping Use   Vaping Use: Never used  Substance Use Topics   Alcohol use: Not Currently    Comment: 03/14/2013 "stopped drinking back in 2002; never had problem w/it"   Drug use: No    Family Hx: The patient's family history  includes Alzheimer's disease in his mother; Osteoporosis in his mother.  ROS     Physical Exam:    VS:  There were no vitals taken for this visit.    Wt Readings from Last 3 Encounters:  04/07/22 210 lb (95.3 kg)  03/22/22 216 lb (98 kg)  03/20/22 214 lb (97.1 kg)    Physical Exam  GEN: Well nourished, well developed, in no acute distress  HEENT: normal  Neck: no JVD, carotid bruits, or masses Cardiac:RRR; no murmurs, rubs, or gallops  Respiratory:  clear to auscultation bilaterally, normal work of breathing GI: soft, nontender, nondistended, + BS Ext: without cyanosis, clubbing, or edema, Good distal pulses bilaterally MS: no deformity or atrophy  Skin: warm and dry, no rash Neuro:  Alert and Oriented x  3, Strength and sensation are intact Psych: euthymic mood, full affect        EKGs/Labs/Other Test Reviewed:    EKG:  EKG is *** ordered today.  The ekg ordered today demonstrates ***  Recent Labs: 03/08/2022: Magnesium 2.1 04/07/2022: ALT 10; B Natriuretic Peptide 80.2; BUN 24; Creatinine, Ser 1.21; Hemoglobin 7.4; Platelets 146; Potassium 3.5; Sodium 140   Recent Lipid Panel Recent Labs    10/26/21 1243  CHOL 132  TRIG 166.0*  HDL 50.10  VLDL 33.2  LDLCALC 49     Prior CV Studies: {Select studies to display:26339}  Echo 02/2022 IMPRESSIONS Left ventricular ejection fraction, by estimation, is 50%. The left ventricle has low normal function. Left ventricular endocardial border not optimally defined to evaluate regional wall motion. There is mild concentric left ventricular hypertrophy. Left ventricular diastolic parameters are indeterminate. 1. Right ventricular systolic function is normal. The right ventricular size is normal. There is normal pulmonary artery systolic pressure. 2. 3. Left atrial size was mildly dilated. The mitral valve is grossly normal. No evidence of mitral valve regurgitation. No evidence of mitral stenosis. 4. The aortic valve is tricuspid. Aortic valve regurgitation is not visualized. Aortic valve sclerosis is present, with no evidence of aortic valve stenosis. 5. The inferior vena cava is dilated in size with >50% respiratory variability, suggesting right atrial pressure of 8 mmHg. 6. Comparison(s): Apex is not as well defined in this study. FINDINGS Left Ventricle: Left ventricular ejection fraction, by estimation, is 50%. The left ventricle has mildly decreased function. Left ventricular endocardial border not optimally defined to evaluate regional wall motion. The left ventricular internal cavity size was normal in size. There is mild concentric left ventricular hypertrophy. Left ventricular diastolic parameters are indeterminate.        Risk Assessment/Calculations/Metrics:   {Does this patient have ATRIAL FIBRILLATION?:[phone]}     No BP recorded.  {Refresh Note OR Click here to enter BP  :1}***    ASSESSMENT & PLAN:   No problem-specific Assessment & Plan notes found for this encounter.   CAD on cath 2020, low risk NST 6/2023In light of GI bleed and PE now on eliquis -I discussed with Dr. Marlou Porch. Will stop Plavix completely.   Chronic Afib was on coumadin and plavix but held with recent GI bleed. Still very weak.  Now on eliquis for PE. Had appt   to see if candidate for watchman but didn't happen.   Acute PE now on eliquis and GI wants to know if he can hold plavix while on eliquis for PE   GI bleed requiring 4 untis PRBC's   CHB s/p PPM insertion followed by Dr. Graceann Congress and dizzy. I stopped  lasix and decrease Imdur 30 mg once daily in November. In ED with weakness and Hgb 7.4-received a transfusion.Marland Kitchen          {Are you ordering a CV Procedure (e.g. stress test, cath, DCCV, TEE, etc)?   Press F2        :[id number]}   Dispo:  No follow-ups on file.   Medication Adjustments/Labs and Tests Ordered: Current medicines are reviewed at length with the patient today.  Concerns regarding medicines are outlined above.  Tests Ordered: No orders of the defined types were placed in this encounter.  Medication Changes: No orders of the defined types were placed in this encounter.  Signed, Ermalinda Barrios, PA-C  04/14/2022 8:26 AM    [facility] [address], [place], [place]  [address] Phone: [phone]; Fax: [phone]

## 2022-04-20 ENCOUNTER — Telehealth: Payer: Self-pay

## 2022-04-20 NOTE — Telephone Encounter (Signed)
        Patient  visited [facility] on 04/08/2022  for generalized weakness, short of breath.   Telephone encounter attempt :  1st  A HIPAA compliant voice message was left requesting a return call.  Instructed patient to call back at [phone].   [facility] Care Guide   [email or web address]  ?? [phone]   [email or web address]: [email or web address]  [email or web address]

## 2022-04-24 ENCOUNTER — Encounter (HOSPITAL_COMMUNITY): Payer: Self-pay

## 2022-04-24 ENCOUNTER — Emergency Department (HOSPITAL_COMMUNITY)
Admission: EM | Admit: 2022-04-24 | Discharge: 2022-04-25 | Disposition: A | Discharge status: Home / Self Care | Payer: 59 | Attending: Emergency Medicine | Admitting: Emergency Medicine

## 2022-04-24 ENCOUNTER — Telehealth: Payer: Self-pay

## 2022-04-24 ENCOUNTER — Other Ambulatory Visit: Payer: Self-pay

## 2022-04-24 ENCOUNTER — Emergency Department (HOSPITAL_COMMUNITY): Payer: 59

## 2022-04-24 DIAGNOSIS — Z950 Presence of cardiac pacemaker: Secondary | ICD-10-CM | POA: Diagnosis not present

## 2022-04-24 DIAGNOSIS — R6889 Other general symptoms and signs: Secondary | ICD-10-CM | POA: Diagnosis not present

## 2022-04-24 DIAGNOSIS — Z7951 Long term (current) use of inhaled steroids: Secondary | ICD-10-CM | POA: Diagnosis not present

## 2022-04-24 DIAGNOSIS — I499 Cardiac arrhythmia, unspecified: Secondary | ICD-10-CM | POA: Diagnosis not present

## 2022-04-24 DIAGNOSIS — I2510 Atherosclerotic heart disease of native coronary artery without angina pectoris: Secondary | ICD-10-CM | POA: Insufficient documentation

## 2022-04-24 DIAGNOSIS — I4891 Unspecified atrial fibrillation: Secondary | ICD-10-CM | POA: Insufficient documentation

## 2022-04-24 DIAGNOSIS — Z7901 Long term (current) use of anticoagulants: Secondary | ICD-10-CM | POA: Diagnosis not present

## 2022-04-24 DIAGNOSIS — Z7984 Long term (current) use of oral hypoglycemic drugs: Secondary | ICD-10-CM | POA: Insufficient documentation

## 2022-04-24 DIAGNOSIS — R0602 Shortness of breath: Secondary | ICD-10-CM

## 2022-04-24 DIAGNOSIS — J449 Chronic obstructive pulmonary disease, unspecified: Secondary | ICD-10-CM | POA: Insufficient documentation

## 2022-04-24 DIAGNOSIS — Z743 Need for continuous supervision: Secondary | ICD-10-CM | POA: Diagnosis not present

## 2022-04-24 DIAGNOSIS — I10 Essential (primary) hypertension: Secondary | ICD-10-CM | POA: Diagnosis not present

## 2022-04-24 DIAGNOSIS — J9811 Atelectasis: Secondary | ICD-10-CM | POA: Diagnosis not present

## 2022-04-24 DIAGNOSIS — R0902 Hypoxemia: Secondary | ICD-10-CM | POA: Diagnosis not present

## 2022-04-24 DIAGNOSIS — R079 Chest pain, unspecified: Secondary | ICD-10-CM | POA: Diagnosis not present

## 2022-04-24 DIAGNOSIS — R404 Transient alteration of awareness: Secondary | ICD-10-CM | POA: Diagnosis not present

## 2022-04-24 DIAGNOSIS — Z79899 Other long term (current) drug therapy: Secondary | ICD-10-CM | POA: Insufficient documentation

## 2022-04-24 LAB — BASIC METABOLIC PANEL
Anion gap: 7 (ref 5–15)
BUN: 23 mg/dL (ref 8–23)
CO2: 22 mmol/L (ref 22–32)
Calcium: 8.7 mg/dL — ABNORMAL LOW (ref 8.9–10.3)
Chloride: 107 mmol/L (ref 98–111)
Creatinine, Ser: 1.45 mg/dL — ABNORMAL HIGH (ref 0.61–1.24)
GFR, Estimated: 52 mL/min — ABNORMAL LOW (ref >60)
Glucose, Bld: 118 mg/dL — ABNORMAL HIGH (ref 70–99)
Potassium: 3.5 mmol/L (ref 3.5–5.1)
Sodium: 136 mmol/L (ref 135–145)

## 2022-04-24 LAB — CBC
HCT: 32.4 % — ABNORMAL LOW (ref 39.0–52.0)
Hemoglobin: 10 g/dL — ABNORMAL LOW (ref 13.0–17.0)
MCH: 27.7 pg (ref 26.0–34.0)
MCHC: 30.9 g/dL (ref 30.0–36.0)
MCV: 89.8 fL (ref 80.0–100.0)
Platelets: 172 10*3/uL (ref 150–400)
RBC: 3.61 MIL/uL — ABNORMAL LOW (ref 4.22–5.81)
RDW: 16.7 % — ABNORMAL HIGH (ref 11.5–15.5)
WBC: 4.6 10*3/uL (ref 4.0–10.5)
nRBC: 0 % (ref 0.0–0.2)

## 2022-04-24 LAB — TROPONIN I (HIGH SENSITIVITY): Troponin I (High Sensitivity): 6 ng/L (ref <18)

## 2022-04-24 NOTE — ED Provider Triage Note (Cosign Needed Addendum)
  Emergency Medicine Provider Triage Evaluation Note  MRN:  [id number]  Arrival date & time: 04/24/22    Medically screening exam initiated at 10:03 PM.   CC:   Shortness of Breath   HPI:  [name] Cook is a 72 y.o. year-old male presents to the ED with chief complaint of SOB x 3 days.  Reports associated dizziness.  Hx of afib on Eliquis.  States symptoms are worse when he stands.  Denies fever.  Denies CP.  History provided by patient. ROS:  -As included in HPI PE:   Vitals:   04/24/22 2150  BP: 106/65  Pulse: 73  Resp: 16  Temp: 98.5 F (36.9 C)  SpO2: 100%    Non-toxic appearing No respiratory distress  MDM:  Based on signs and symptoms, ACS is highest on my differential, followed by COVID. I've ordered labs and imaging in triage to expedite lab/diagnostic workup.  Patient was informed that the remainder of the evaluation will be completed by another provider, this initial triage assessment does not replace that evaluation, and the importance of remaining in the ED until their evaluation is complete.    Montine Circle, PA-C 04/24/22 2205    Montine Circle, PA-C 04/24/22 2205

## 2022-04-24 NOTE — ED Triage Notes (Signed)
Pt BIB [facility] from home. Pt presents with ShOB and dizziness x 2 days. Pt had previous episode of same symptoms at the end of December. Pt has hx of recent GI bleed. EMS report that pt was negative for orthostatic changes.   EMS Vitals 103/55 HR 84 SpO2 89% on R/A.  CBG 149

## 2022-04-24 NOTE — Telephone Encounter (Signed)
     Patient  visit on 04/08/2022  at [facility] was for generalized weakness, short of breath.  Have you been able to follow up with your primary care physician? Patient has appointment 04/28/2022.  The patient was or was not able to obtain any needed medicine or equipment. No medication prescribed.  Are there diet recommendations that you are having difficulty following? No  Patient expresses understanding of discharge instructions and education provided has no other needs at this time.    [place] [facility] Guide   [email or web address]  ?? [phone]   Website: [email or web address]  [email or web address]

## 2022-04-25 ENCOUNTER — Ambulatory Visit: Payer: 59 | Admitting: Physician Assistant

## 2022-04-25 DIAGNOSIS — R0602 Shortness of breath: Secondary | ICD-10-CM | POA: Diagnosis not present

## 2022-04-25 LAB — TROPONIN I (HIGH SENSITIVITY): Troponin I (High Sensitivity): 6 ng/L (ref <18)

## 2022-04-25 NOTE — ED Provider Notes (Signed)
[facility] EMERGENCY DEPARTMENT Provider Note   CSN: [id number] Arrival date & time: 04/24/22  2108     History  Chief Complaint  Patient presents with   Shortness of Breath    [name] Cook is a 72 y.o. male.  Patient here with shortness of breath.  Denies any chest pain.  He is asymptomatic now.  He is noticing shortness of breath very mild at times.  History of COPD, hypertension, high cholesterol, atrial fibrillation status post pacemaker and on on Eliquis.  Blood clot history as well.  CAD history.  He denies any infectious symptoms.  No active chest pain or shortness of breath.  Has not passed out.  No leg swelling.  No nausea vomiting diarrhea.  The history is provided by the patient.       Home Medications Prior to Admission medications   Medication Sig Start Date End Date Taking? Authorizing Provider  albuterol (VENTOLIN HFA) 108 (90 Base) MCG/ACT inhaler INHALE 2 PUFFS BY MOUTH EVERY 4 HOURS AS NEEDED FOR WHEEZE OR FOR SHORTNESS OF BREATH Patient taking differently: Inhale 2 puffs into the lungs every 4 (four) hours as needed for shortness of breath or wheezing. 01/06/22   [name], NP  apixaban (ELIQUIS) 5 MG TABS tablet Take 1 tablet (5 mg total) by mouth 2 (two) times daily. 04/10/22   Geradine Girt, DO  APIXABAN Arne [place]) VTE STARTER PACK ('10MG'$  AND '5MG'$ ) Take as directed on package: start with two-'5mg'$  tablets twice daily for 7 days. On day 8, switch to one-'5mg'$  tablet twice daily. 03/11/22   Geradine Girt, DO  Ascorbic Acid (VITAMIN C) 1000 MG tablet Take 2,000 mg by mouth daily.    [provider]  blood glucose meter kit and supplies KIT Dispense based on patient and insurance preference. Use up to four times daily as directed. (FOR ICD-9 250.00, 250.01). 04/08/21   [name], NP  budesonide-formoterol (SYMBICORT) 160-4.5 MCG/ACT inhaler INHALE 2 PUFFS INTO THE LUNGS TWICE A DAY Patient taking differently: Inhale 2 puffs  into the lungs daily as needed (for shortness of breath and wheezing). 01/12/21   [name], NP  fluticasone (FLONASE) 50 MCG/ACT nasal spray PLACE 1 SPRAY INTO BOTH NOSTRILS DAILY AS NEEDED FOR ALLERGIES OR RHINITIS. Patient taking differently: Place 1 spray into both nostrils daily as needed for allergies or rhinitis. 06/23/21   [name], NP  gabapentin (NEURONTIN) 300 MG capsule TAKE 1 CAPSULE BY MOUTH EVERY MORNING AND 2 CAPSULES BY MOUTH AT NIGHT FOR NEUROPATHY Patient taking differently: Take 300-600 mg by mouth See admin instructions. Take 1 capsule by mouth every morning and 2 capsules by mouth at night for neuropathy 11/18/21   [name], NP  glipiZIDE (GLUCOTROL XL) 10 MG 24 hr tablet TAKE 1 TABLET (10 MG TOTAL) BY MOUTH DAILY WITH BREAKFAST. FOR DIABETES. 12/05/21   [name], NP  glucose blood test strip 1 each by Other route as needed for other. Use to check blood sugar up to three times a day-Accu-Chek meter    [provider]  isosorbide mononitrate (IMDUR) 30 MG 24 hr tablet Take 1 tablet (30 mg total) by mouth daily. Patient not taking: Reported on 04/08/2022 03/15/22   Imogene Burn, PA-C  JARDIANCE 25 MG TABS tablet TAKE 1 TABLET (25 MG TOTAL) BY MOUTH DAILY BEFORE BREAKFAST. FOR DIABETES. Patient taking differently: Take 25 mg by mouth daily. Take before breakfast for diabetes 12/22/21  [name], NP  lisinopril (ZESTRIL) 2.5 MG tablet TAKE 1 TABLET BY MOUTH EVERY DAY Patient taking differently: Take 2.5 mg by mouth daily. 07/11/21   [name], MD  nitroGLYCERIN (NITROSTAT) 0.4 MG SL tablet Place 1 tablet (0.4 mg total) under the tongue every 5 (five) minutes as needed for chest pain. 04/08/21   [name], NP  pantoprazole (PROTONIX) 40 MG tablet Take 1 tablet (40 mg total) by mouth daily. 03/12/22 06/10/22  Geradine Girt, DO  rosuvastatin (CRESTOR) 20 MG tablet TAKE 1 TABLET BY MOUTH EVERY DAY IN THE EVENING FOR  CHOLESTEROL Patient taking differently: Take 20 mg by mouth every evening.  FOR CHOLESTEROL 11/24/21   [name], NP  sertraline (ZOLOFT) 50 MG tablet TAKE 1 TABLET (50 MG TOTAL) BY MOUTH DAILY. FOR ANXIETY AND DEPRESSION. 01/06/22   [name], NP  tamsulosin (FLOMAX) 0.4 MG CAPS capsule TAKE 1 CAPSULE (0.4 MG TOTAL) BY MOUTH 2 (TWO) TIMES DAILY. 01/12/21   Noreene Filbert, MD  TYLENOL 500 MG tablet Take 500-1,000 mg by mouth every 6 (six) hours as needed for mild pain or headache.    [provider]      Allergies    Januvia [sitagliptin] and Metformin and related    Review of Systems   Review of Systems  Physical Exam Updated Vital Signs BP 110/75 (BP Location: Right Arm)   Pulse 90   Temp (!) 97.4 F (36.3 C) (Oral)   Resp 16   Ht '5\' 11"'$  (1.803 m)   Wt 94.3 kg   SpO2 100%   BMI 29.01 kg/m  Physical Exam Vitals and nursing note reviewed.  Constitutional:      General: He is not in acute distress.    Appearance: He is well-developed. He is not ill-appearing.  HENT:     Head: Normocephalic and atraumatic.     Mouth/Throat:     Mouth: Mucous membranes are moist.  Eyes:     Conjunctiva/sclera: Conjunctivae normal.     Pupils: Pupils are equal, round, and reactive to light.  Cardiovascular:     Rate and Rhythm: Normal rate and regular rhythm.     Pulses: Normal pulses.     Heart sounds: Normal heart sounds. No murmur heard. Pulmonary:     Effort: Pulmonary effort is normal. No respiratory distress.     Breath sounds: Normal breath sounds. No decreased breath sounds or wheezing.  Abdominal:     Palpations: Abdomen is soft.     Tenderness: There is no abdominal tenderness.  Musculoskeletal:        General: No swelling.     Cervical back: Normal range of motion and neck supple.     Right lower leg: No edema.     Left lower leg: No edema.  Skin:    General: Skin is warm and dry.     Capillary Refill: Capillary refill takes less than 2 seconds.   Neurological:     Mental Status: He is alert.  Psychiatric:        Mood and Affect: Mood normal.     ED Results / Procedures / Treatments   Labs (all labs ordered are listed, but only abnormal results are displayed) Labs Reviewed  BASIC METABOLIC PANEL - Abnormal; Notable for the following components:      Result Value   Glucose, Bld 118 (*)    Creatinine, Ser 1.45 (*)    Calcium 8.7 (*)    GFR,  Estimated 52 (*)    All other components within normal limits  CBC - Abnormal; Notable for the following components:   RBC 3.61 (*)    Hemoglobin 10.0 (*)    HCT 32.4 (*)    RDW 16.7 (*)    All other components within normal limits  TROPONIN I (HIGH SENSITIVITY)  TROPONIN I (HIGH SENSITIVITY)    EKG None  Radiology DG Chest 2 View  Result Date: 04/24/2022 CLINICAL DATA:  chest pain EXAM: CHEST - 2 VIEW COMPARISON:  Chest x-ray 04/07/2022, CT angio chest 04/07/2022 FINDINGS: Left chest wall dual lead pacemaker in grossly similar position. The heart and mediastinal contours are unchanged. Aortic calcification. Bibasilar atelectasis. No focal consolidation. No pulmonary edema. No pleural effusion. No pneumothorax. No acute osseous abnormality. IMPRESSION: 1. No active cardiopulmonary disease. 2.  Aortic Atherosclerosis (ICD10-I70.0). Electronically Signed   By: Iven Finn M.D.   On: 04/24/2022 22:18    Procedures Procedures    Medications Ordered in ED Medications - No data to display  ED Course/ Medical Decision Making/ A&P                           Medical Decision Making Amount and/or Complexity of Data Reviewed Labs: ordered. Radiology: ordered.   [name] Cook is here with shortness of breath.  Normal vitals.  No fever.  Well-appearing.  Blood work ready collected prior to my evaluation.  History of COPD, CAD, atrial fibrillation status post pacemaker on Eliquis, blood clot on Eliquis.  Overall he denies any chest pain or shortness of breath currently.  EKG shows  paced rhythm.  No ischemic changes.  Troponin negative x 2.  Doubt ACS.  He is on anticoagulation and has normal troponins and no hypoxia no tachycardia and have no suspicion for PE.  His hemoglobin is 10 per my review and interpretation of labs which is well above his baseline.  I have no concern for bleeding process.  He has no significant AKI or other electrolyte abnormality or leukocytosis.  Chest x-ray per my review and interpretation shows no evidence of pneumonia or pneumothorax.  Overall patient given reassurance and discharged from the ED in good condition.  Recommend follow-up with primary care doctor.  This chart was dictated using voice recognition software.  Despite best efforts to proofread,  errors can occur which can change the documentation meaning.         Final Clinical Impression(s) / ED Diagnoses Final diagnoses:  Shortness of breath    Rx / DC Orders ED Discharge Orders     None         Lennice Sites, DO 04/25/22 3612

## 2022-04-28 ENCOUNTER — Encounter: Payer: Self-pay | Admitting: Primary Care

## 2022-04-28 ENCOUNTER — Ambulatory Visit (INDEPENDENT_AMBULATORY_CARE_PROVIDER_SITE_OTHER): Payer: 59 | Admitting: Primary Care

## 2022-04-28 VITALS — BP 102/60 | HR 91 | Temp 96.8°F | Ht 71.0 in | Wt 209.0 lb

## 2022-04-28 DIAGNOSIS — R251 Tremor, unspecified: Secondary | ICD-10-CM

## 2022-04-28 DIAGNOSIS — E1165 Type 2 diabetes mellitus with hyperglycemia: Secondary | ICD-10-CM | POA: Diagnosis not present

## 2022-04-28 LAB — BASIC METABOLIC PANEL
BUN: 20 mg/dL (ref 6–23)
CO2: 27 mEq/L (ref 19–32)
Calcium: 9 mg/dL (ref 8.4–10.5)
Chloride: 104 mEq/L (ref 96–112)
Creatinine, Ser: 1.26 mg/dL (ref 0.40–1.50)
GFR: 58.02 mL/min — ABNORMAL LOW (ref >60.00)
Glucose, Bld: 141 mg/dL — ABNORMAL HIGH (ref 70–99)
Potassium: 3.9 mEq/L (ref 3.5–5.1)
Sodium: 141 mEq/L (ref 135–145)

## 2022-04-28 LAB — POCT GLYCOSYLATED HEMOGLOBIN (HGB A1C): Hemoglobin A1C: 5.8 % — AB (ref 4.0–5.6)

## 2022-04-28 LAB — T4, FREE: Free T4: 0.88 ng/dL (ref 0.60–1.60)

## 2022-04-28 LAB — TSH: TSH: 1.17 u[IU]/mL (ref 0.35–5.50)

## 2022-04-28 NOTE — Patient Instructions (Signed)
Stop by the lab prior to leaving today. I will notify you of your results once received.   Please schedule a physical to meet with me in 6 months.   It was a pleasure to see you today!

## 2022-04-28 NOTE — Assessment & Plan Note (Signed)
Improved and controlled with A1C of 5.8 today.  Urine microalbumin due and pending. Foot exam today.  Continue Jardiance 25 mg daily and Glipizide XL 10 mg daily. Commended him on dietary changes.   Follow up in 6 months.

## 2022-04-28 NOTE — Assessment & Plan Note (Signed)
With rest and movement.  Suspect essential tremor, especially given exam today. Offered neurology evaluation for which he kindly declines. Reviewed recent BMP. No known history of thyroid disorder and prior thyroid testing is negative.  Consider propranolol for treatment, but will touch base with cardiology first.   Repeat thyroid studies and electrolytes pending today.

## 2022-04-28 NOTE — Addendum Note (Signed)
Addended by: Ellamae Sia on: 04/28/2022 03:31 PM   Modules accepted: Orders

## 2022-04-28 NOTE — Progress Notes (Signed)
Subjective:    Patient ID: [name] Cook, male    DOB: Jun 07, 1950, 72 y.o.   MRN: [id number]  HPI  [name] Cook is a very pleasant 72 y.o. male  has a past medical history of Acute bronchitis with COPD ([place]) (03/07/2021), Arthritis, Atrial flutter ([place]), CAD (coronary artery disease), Chronic anticoagulation, Chronic obstructive pulmonary disease ([place]) (04/20/2011), Diabetes mellitus, type 2 ([place]), Elevated lipase (06/23/2021), GERD (gastroesophageal reflux disease), Hyperlipidemia, Hypertension, Left knee pain (10/25/2017), Obesity, Persistent atrial fibrillation ([place]), Shortness of breath, Sleep apnea, Symptomatic anemia (03/08/2022), and Tobacco abuse. who presents today for follow up of diabetes and tremors.   1) Type 2 Diabetes:  Current medications include: Jardiance 25 mg daily, Glipizide XL 10 mg daily,   He is checking his blood glucose every other day and is getting readings of 130's.  Last A1C: 6.4 in November 2023 (checked in hospital), 5.8 today Last Eye Exam: UTD Last Foot Exam: Due Pneumonia Vaccination: UTD Urine Microalbumin: Due Statin: rosuvastatin   Dietary changes since last visit: He has cut back on sweets and junk food. He has increased his intake of water.    Exercise:  None.   2) Tremors: Chronic for years, located to bilateral hands, no progression. His hands will shake at rest and with movement. Sometimes he has a hard time maneuvering objects due to his tremors. He denies tremors to his head, lower extremities, family history of Parkinson's disease. He has never seen neurology or been treated for tremors. He is not currently on beta blocker management.     Review of Systems  Cardiovascular:  Negative for chest pain.  Endocrine: Negative for polydipsia, polyphagia and polyuria.  Neurological:  Positive for dizziness, tremors and numbness.         Past Medical History:  Diagnosis Date   Acute bronchitis with COPD ([place]) 03/07/2021   Arthritis     "knees and lower back" (03/14/2013)   Atrial flutter ([place])    radiofrequency ablation in 2001   CAD (coronary artery disease)    a. Nonobstructive. Cardiac cath in 2001-50% mid RI, normal LM, LAD, RCA b. cath 10/16/2014 95% mid RCA treated with DES, 99% ost D1 medical management due to small aneurysmal segment   Chronic anticoagulation    chronic Coumadin anticoagulation   Chronic obstructive pulmonary disease ([place]) 04/20/2011   Diabetes mellitus, type 2 (HCC)    Elevated lipase 06/23/2021   GERD (gastroesophageal reflux disease)    Hyperlipidemia    Hypertension    with hypertensive heart disease   Left knee pain 10/25/2017   medial   Obesity    Persistent atrial fibrillation ([place])    recurrent atrial flutter since 2001 s/p DCCVs, multiple failed AADs, h/o tachy-mediated cardiomyopathy   Shortness of breath    "can come on at any time" (03/14/2013)   Sleep apnea    "dx'd; couldn't wear the mask" (03/14/2013)   Symptomatic anemia 03/08/2022   Tobacco abuse     Social History   Socioeconomic History   Marital status: Widowed    Spouse name: Not on file   Number of children: 1   Years of education: Not on file   Highest education level: Not on file  Occupational History   Occupation: Unemployed    Employer: UNEMPLOYED  Tobacco Use   Smoking status: Former    Packs/day: 1.00    Years: 42.00    Total pack years: 42.00    Types: Cigarettes    Quit date: 12/30/2013  Years since quitting: 8.3   Smokeless tobacco: Never   Tobacco comments:    Former smoker 03/20/22  Vaping Use   Vaping Use: Never used  Substance and Sexual Activity   Alcohol use: Not Currently    Comment: 03/14/2013 "stopped drinking back in 2002; never had problem w/it"   Drug use: No   Sexual activity: Not Currently  Other Topics Concern   Not on file  Social History Narrative   Single.   Retired.    1 son, deceased.    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   Enjoys playing on the computer.       Social Determinants of Health   Financial Resource Strain: Low Risk  (10/07/2021)   Overall Financial Resource Strain (CARDIA)    Difficulty of Paying Living Expenses: Not hard at all  Food Insecurity: No Food Insecurity (03/09/2022)   Hunger Vital Sign    Worried About Running Out of Food in the Last Year: Never true    Ran Out of Food in the Last Year: Never true  Transportation Needs: No Transportation Needs (03/09/2022)   PRAPARE - Hydrologist (Medical): No    Lack of Transportation (Non-Medical): No  Physical Activity: Unknown (10/07/2021)   Exercise Vital Sign    Days of Exercise per Week: 3 days    Minutes of Exercise per Session: Not on file  Stress: No Stress Concern Present (10/07/2021)   [place]    Feeling of Stress : Not at all  Social Connections: Socially Isolated (10/07/2021)   Social Connection and Isolation Panel [NHANES]    Frequency of Communication with Friends and Family: Once a week    Frequency of Social Gatherings with Friends and Family: Once a week    Attends Religious Services: Never    Marine scientist or Organizations: No    Attends Archivist Meetings: Never    Marital Status: Widowed  Intimate Partner Violence: Not At Risk (03/09/2022)   Humiliation, Afraid, Rape, and Kick questionnaire    Fear of Current or Ex-Partner: No    Emotionally Abused: No    Physically Abused: No    Sexually Abused: No    Past Surgical History:  Procedure Laterality Date   ATRIAL FLUTTER ABLATION  2002   atrial flutter; subsequently developed atrial fibrillation   AV NODE ABLATION  01/24/2013   CARDIAC CATHETERIZATION  2002   CARDIAC CATHETERIZATION N/A 10/16/2014   Procedure: Left Heart Cath and Coronary Angiography;  Surgeon: [name], MD;  Location: [facility];  Service: Cardiovascular;   Laterality: N/A;   CARDIOVERSION  05/31/2011   Procedure: CARDIOVERSION;  Surgeon: Cristopher Estimable. Lattie Haw, MD;  Location: [facility];  Service: Cardiovascular;  Laterality: N/A;   CARPAL TUNNEL RELEASE Left 1980's   COLONOSCOPY WITH PROPOFOL N/A 12/30/2018   Procedure: COLONOSCOPY WITH PROPOFOL;  Surgeon: Jonathon Bellows, MD;  Location: [facility] ENDOSCOPY;  Service: Gastroenterology;  Laterality: N/A;   COLONOSCOPY WITH PROPOFOL N/A 12/04/2019   Procedure: COLONOSCOPY WITH PROPOFOL;  Surgeon: Jonathon Bellows, MD;  Location: [facility] ENDOSCOPY;  Service: Gastroenterology;  Laterality: N/A;   COLONOSCOPY WITH PROPOFOL N/A 06/13/2021   Procedure: COLONOSCOPY WITH PROPOFOL;  Surgeon: Jonathon Bellows, MD;  [facility];  Service: Gastroenterology;  Laterality: N/A;   ESOPHAGOGASTRODUODENOSCOPY N/A 03/01/2022   Procedure: ESOPHAGOGASTRODUODENOSCOPY (EGD);  Surgeon: Clarene Essex, MD;  Location: [facility];  Service:  Gastroenterology;  Laterality: N/A;   GIVENS CAPSULE STUDY Left 03/10/2022   Procedure: GIVENS CAPSULE STUDY;  Surgeon: Arta Silence, MD;  Location: [facility] ENDOSCOPY;  Service: Gastroenterology;  Laterality: Left;   INSERT / REPLACE / REMOVE PACEMAKER  01/24/2013    Medtronic Adapta L dual-chamber pacemaker, serial number [id number]    LEFT HEART CATH AND CORONARY ANGIOGRAPHY N/A 06/07/2018   Procedure: LEFT HEART CATH AND CORONARY ANGIOGRAPHY;  Surgeon: [name], MD;  Location: [facility];  Service: Cardiovascular;  Laterality: N/A;   LEFT HEART CATHETERIZATION WITH CORONARY ANGIOGRAM N/A 03/17/2013   Procedure: LEFT HEART CATHETERIZATION WITH CORONARY ANGIOGRAM;  Surgeon: Burnell Blanks, MD; LAD mild dz, D1 branch 100%, inferior branch 99%, CFX OK, RCA 50%, EF 65%     LOOP RECORDER IMPLANT  2002   PERMANENT PACEMAKER INSERTION N/A 01/24/2013   Procedure: PERMANENT PACEMAKER INSERTION;  Surgeon: [name], MD;  Location[facility] C[facility] LAB;  Service: Cardiovascular;  Laterality: N/A;    TIBIAL TUBERCLERPLASTY  ~ 2003    Family History  Problem Relation Age of Onset   Alzheimer's disease Mother    Osteoporosis Mother     Allergies  Allergen Reactions   Januvia [Sitagliptin] Other (See Comments)    Elevated lipase   Metformin And Related Diarrhea    Current Outpatient Medications on File Prior to Visit  Medication Sig Dispense Refill   albuterol (VENTOLIN HFA) 108 (90 Base) MCG/ACT inhaler INHALE 2 PUFFS BY MOUTH EVERY 4 HOURS AS NEEDED FOR WHEEZE OR FOR SHORTNESS OF BREATH (Patient taking differently: Inhale 2 puffs into the lungs every 4 (four) hours as needed for shortness of breath or wheezing.) 8.5 each 0   apixaban (ELIQUIS) 5 MG TABS tablet Take 1 tablet (5 mg total) by mouth 2 (two) times daily. 60 tablet 1   APIXABAN (ELIQUIS) VTE STARTER PACK ('10MG'$  AND '5MG'$ ) Take as directed on package: start with two-'5mg'$  tablets twice daily for 7 days. On day 8, switch to one-'5mg'$  tablet twice daily. 74 each 0   Ascorbic Acid (VITAMIN C) 1000 MG tablet Take 2,000 mg by mouth daily.     blood glucose meter kit and supplies KIT Dispense based on patient and insurance preference. Use up to four times daily as directed. (FOR ICD-9 250.00, 250.01). 1 each 0   budesonide-formoterol (SYMBICORT) 160-4.5 MCG/ACT inhaler INHALE 2 PUFFS INTO THE LUNGS TWICE A DAY (Patient taking differently: Inhale 2 puffs into the lungs daily as needed (for shortness of breath and wheezing).) 10.2 each 5   fluticasone (FLONASE) 50 MCG/ACT nasal spray PLACE 1 SPRAY INTO BOTH NOSTRILS DAILY AS NEEDED FOR ALLERGIES OR RHINITIS. (Patient taking differently: Place 1 spray into both nostrils daily as needed for allergies or rhinitis.) 48 mL 3   gabapentin (NEURONTIN) 300 MG capsule TAKE 1 CAPSULE BY MOUTH EVERY MORNING AND 2 CAPSULES BY MOUTH AT NIGHT FOR NEUROPATHY (Patient taking differently: Take 300-600 mg by mouth See admin instructions. Take 1 capsule by mouth every morning and 2 capsules by mouth at night for  neuropathy) 270 capsule 3   glipiZIDE (GLUCOTROL XL) 10 MG 24 hr tablet TAKE 1 TABLET (10 MG TOTAL) BY MOUTH DAILY WITH BREAKFAST. FOR DIABETES. 90 tablet 1   isosorbide mononitrate (IMDUR) 30 MG 24 hr tablet Take 1 tablet (30 mg total) by mouth daily. 30 tablet 1   JARDIANCE 25 MG TABS tablet TAKE 1 TABLET (25 MG TOTAL) BY MOUTH DAILY BEFORE BREAKFAST. FOR DIABETES. (Patient taking  differently: Take 25 mg by mouth daily. Take before breakfast for diabetes) 90 tablet 1   lisinopril (ZESTRIL) 2.5 MG tablet TAKE 1 TABLET BY MOUTH EVERY DAY (Patient taking differently: Take 2.5 mg by mouth daily.) 90 tablet 3   nitroGLYCERIN (NITROSTAT) 0.4 MG SL tablet Place 1 tablet (0.4 mg total) under the tongue every 5 (five) minutes as needed for chest pain. 25 tablet 0   pantoprazole (PROTONIX) 40 MG tablet Take 1 tablet (40 mg total) by mouth daily. 30 tablet 2   rosuvastatin (CRESTOR) 20 MG tablet TAKE 1 TABLET BY MOUTH EVERY DAY IN THE EVENING FOR CHOLESTEROL (Patient taking differently: Take 20 mg by mouth every evening.  FOR CHOLESTEROL) 90 tablet 3   sertraline (ZOLOFT) 50 MG tablet TAKE 1 TABLET (50 MG TOTAL) BY MOUTH DAILY. FOR ANXIETY AND DEPRESSION. 90 tablet 2   tamsulosin (FLOMAX) 0.4 MG CAPS capsule TAKE 1 CAPSULE (0.4 MG TOTAL) BY MOUTH 2 (TWO) TIMES DAILY. 180 capsule 3   TYLENOL 500 MG tablet Take 500-1,000 mg by mouth every 6 (six) hours as needed for mild pain or headache.     glucose blood test strip 1 each by Other route as needed for other. Use to check blood sugar up to three times a day-Accu-Chek meter     No current facility-administered medications on file prior to visit.    BP 102/60   Pulse 91   Temp (!) 96.8 F (36 C) (Temporal)   Ht '5\' 11"'$  (1.803 m)   Wt 209 lb (94.8 kg)   SpO2 97%   BMI 29.15 kg/m  Objective:   Physical Exam Cardiovascular:     Rate and Rhythm: Normal rate and regular rhythm.  Pulmonary:     Effort: Pulmonary effort is normal.     Breath sounds: Normal  breath sounds. No wheezing or rales.  Musculoskeletal:     Cervical back: Neck supple.  Skin:    General: Skin is warm and dry.  Neurological:     Mental Status: He is alert and oriented to person, place, and time.     Cranial Nerves: No cranial nerve deficit.     Sensory: No sensory deficit.     Coordination: Coordination normal.     Comments: Resting and movement tremors noted to bilateral hands. Moderate.  Tremor abates with rest against his legs.   No tremors noted to lower extremities, voice, or head.  No shuffling gait.           Assessment & Plan:  Type 2 diabetes mellitus with hyperglycemia, without long-term current use of insulin (HCC) Assessment & Plan: Improved and controlled with A1C of 5.8 today.  Urine microalbumin due and pending. Foot exam today.  Continue Jardiance 25 mg daily and Glipizide XL 10 mg daily. Commended him on dietary changes.   Follow up in 6 months.  Orders: -     Microalbumin / creatinine urine ratio -     POCT glycosylated hemoglobin (Hb A1C)  Tremor of both hands Assessment & Plan: With rest and movement.  Suspect essential tremor, especially given exam today. Offered neurology evaluation for which he kindly declines. Reviewed recent BMP. No known history of thyroid disorder and prior thyroid testing is negative.  Consider propranolol for treatment, but will touch base with cardiology first.   Repeat thyroid studies and electrolytes pending today.  Orders: -     Basic metabolic panel -     TSH -     T4, free  [name], NP

## 2022-05-01 ENCOUNTER — Ambulatory Visit: Payer: 59 | Attending: Cardiovascular Disease | Admitting: Cardiovascular Disease

## 2022-05-01 ENCOUNTER — Other Ambulatory Visit (HOSPITAL_COMMUNITY): Payer: Self-pay

## 2022-05-01 ENCOUNTER — Encounter: Payer: Self-pay | Admitting: Cardiovascular Disease

## 2022-05-01 VITALS — BP 102/58 | HR 88 | Ht 71.0 in | Wt 213.0 lb

## 2022-05-01 DIAGNOSIS — I4821 Permanent atrial fibrillation: Secondary | ICD-10-CM

## 2022-05-01 LAB — MICROALBUMIN / CREATININE URINE RATIO
Creatinine,U: 59.9 mg/dL
Microalb Creat Ratio: 1.9 mg/g (ref 0.0–30.0)
Microalb, Ur: 1.1 mg/dL (ref 0.0–1.9)

## 2022-05-01 MED ORDER — IVABRADINE HCL 5 MG PO TABS
ORAL_TABLET | ORAL | 0 refills | Status: DC
  Filled 2022-05-01 – 2022-05-11 (×2): qty 2, 1d supply, fill #0

## 2022-05-01 NOTE — Patient Instructions (Addendum)
Medication Instructions:  Your physician recommends that you continue on your current medications as directed. Please refer to the Current Medication list given to you today.  *If you need a refill on your cardiac medications before your next appointment, please call your pharmacy*   Lab Work: NONE If you have labs (blood work) drawn today and your tests are completely normal, you will receive your results only by: [place] (if you have MyChart) OR A paper copy in the mail If you have any lab test that is abnormal or we need to change your treatment, we will call you to review the results.   Testing/Procedures: Watchman CT Your physician has requested that you have cardiac CT. Cardiac computed tomography (CT) is a painless test that uses an x-ray machine to take clear, detailed pictures of your heart. For further information please visit [email or web address]. Please follow instruction sheet as given.  Follow-Up: At [facility], you and your health needs are our priority.  As part of our continuing mission to provide you with exceptional heart care, we have created designated Provider Care Teams.  These Care Teams include your primary Cardiologist (physician) and Advanced Practice Providers (APPs -  Physician Assistants and Nurse Practitioners) who all work together to provide you with the care you need, when you need it.  We recommend signing up for the patient portal called "MyChart".  Sign up information is provided on this After Visit Summary.  MyChart is used to connect with patients for Virtual Visits (Telemedicine).  Patients are able to view lab/test results, encounter notes, upcoming appointments, etc.  Non-urgent messages can be sent to your provider as well.   To learn more about what you can do with MyChart, go to [email or web address].    Your next appointment:   Structural team will follow-up  Provider:   [name], MD    Other Instructions WATCHMAN  CT INSTRUCTIONS: Your WATCHMAN CT will be scheduled at [place] will be called to confirm date and time.  The day of your CT appointment, please enter [facility] through the [facility] (Entrance C off [facility] You may use the FREE valet parking offered at entrance C (encouraged to control the heart rate for the test). Check in at the large round desk through the glass doors 30 minutes prior to your scheduled appointment.   Please follow these instructions carefully:  Hold all erectile dysfunction medications at least 3 days (72 hrs) prior to test.  On the Night Before the Test: Be sure to Drink plenty of water. Do not consume any caffeinated/decaffeinated beverages or chocolate 12 hours prior to your test. Do not take any antihistamines 12 hours prior to your test. On the Day of the Test: You may take your regular medications prior to the test.  Take Ivabradine '10mg'$  two hours prior to test      After the Test: Drink plenty of water. After receiving IV contrast, you may experience a mild flushed feeling. This is normal. On occasion, you may experience a mild rash up to 24 hours after the test. This is not dangerous. If this occurs, you can take Benadryl 25 mg and increase your fluid intake. If you experience trouble breathing, this can be serious. If it is severe call 911 IMMEDIATELY. If it is mild, please call our office. If you take any of these medications: Glipizide/Metformin, Avandament, Glucavance, please do not take 48 hours after completing test unless otherwise instructed.  Once we  have confirmed authorization from your insurance company, you will be called to set up a date and time for your test.   For non-scheduling related questions/concerns about your CT scan, please contact the cardiac imaging nurses: Marchia Bond, Cardiac Imaging Nurse Navigator Gordy Clement, Cardiac Imaging Nurse [facility] Direct Office Dial: [phone]   For scheduling needs, including cancellations and rescheduling, please call [place], [phone].  Lenice Llamas, the Watchman Nurse Navigator, will call you after your CT once the [facility] Team has reviewed your imaging for an update on proceedings. [place]'s direct number is [phone] if you need assistance.

## 2022-05-01 NOTE — Progress Notes (Unsigned)
Watchman Consult Note   Date:  05/02/2022   ID:  JUANDANIEL Cook, DOB 1950/10/26, MRN [id number]  PCP:  [name], NP  Cardiologist:  Candee Furbish, MD Primary Electrophysiologist: none Referring Physician: Adline Peals, PA-C   CC: to discuss Watchman implant    History of Present Illness: [name] Cook is a 72 y.o. male referred by Adline Peals for evaluation of atrial fibrillation and stroke prevention. He has permanent atrial fibrillation.  The patient has been evaluated by their referring physician and is felt to be a poor candidate for long term [place] due to GI bleed.  He therefore presents today for Watchman evaluation.   The patient has a history of complete heart block status post permanent pacing, coronary artery disease, type 2 diabetes, mixed hyperlipidemia, and hypertension.  He has longstanding atrial fibrillation dating back to 2012.  He failed several antiarrhythmic drugs and cardioversions.  The patient underwent AV node ablation in 2014.  He had previously been on clopidogrel and warfarin but was admitted in November 2023 with an acute GI bleed.  Warfarin was held at that time.  EGD demonstrated erosive gastropathy with stigmata of recent bleeding.  Capsule endoscopy showed mild inflammation of the duodenum with no other significant pathology identified.  Colonoscopy earlier that year showed polyps in the sigmoid and ascending colon.  He then was readmitted 10 days later with an acute pulmonary embolus and was started on apixaban.  Hemoglobin in December was as low as 7.4 but on most recent labs had improved to 10.0. He has required a total of 5 units of PRBC transfusion.   Today, he denies symptoms of palpitations, chest pain, shortness of breath, orthopnea, PND, lower extremity edema, claudication, dizziness, presyncope, syncope, bleeding, or neurologic sequela. The patient is tolerating medications without difficulties and is otherwise without complaint today.    Past  Medical History:  Diagnosis Date   Acute bronchitis with COPD ([place]) 03/07/2021   Arthritis    "knees and lower back" (03/14/2013)   Atrial flutter ([place])    radiofrequency ablation in 2001   CAD (coronary artery disease)    a. Nonobstructive. Cardiac cath in 2001-50% mid RI, normal LM, LAD, RCA b. cath 10/16/2014 95% mid RCA treated with DES, 99% ost D1 medical management due to small aneurysmal segment   Chronic anticoagulation    chronic Coumadin anticoagulation   Chronic obstructive pulmonary disease ([place]) 04/20/2011   Diabetes mellitus, type 2 (HCC)    Elevated lipase 06/23/2021   GERD (gastroesophageal reflux disease)    Hyperlipidemia    Hypertension    with hypertensive heart disease   Left knee pain 10/25/2017   medial   Obesity    Persistent atrial fibrillation ([place])    recurrent atrial flutter since 2001 s/p DCCVs, multiple failed AADs, h/o tachy-mediated cardiomyopathy   Shortness of breath    "can come on at any time" (03/14/2013)   Sleep apnea    "dx'd; couldn't wear the mask" (03/14/2013)   Symptomatic anemia 03/08/2022   Tobacco abuse    Past Surgical History:  Procedure Laterality Date   ATRIAL FLUTTER ABLATION  2002   atrial flutter; subsequently developed atrial fibrillation   AV NODE ABLATION  01/24/2013   CARDIAC CATHETERIZATION  2002   CARDIAC CATHETERIZATION N/A 10/16/2014   Procedure: Left Heart Cath and Coronary Angiography;  Surgeon: [name], MD;  Location: [facility];  Service: Cardiovascular;  Laterality: N/A;   CARDIOVERSION  05/31/2011   Procedure:  CARDIOVERSION;  Surgeon: Cristopher Estimable. Lattie Haw, MD;  Location: [facility];  Service: Cardiovascular;  Laterality: N/A;   CARPAL TUNNEL RELEASE Left 1980's   COLONOSCOPY WITH PROPOFOL N/A 12/30/2018   Procedure: COLONOSCOPY WITH PROPOFOL;  Surgeon: Jonathon Bellows, MD;  Location: [facility] ENDOSCOPY;  Service: Gastroenterology;  Laterality: N/A;   COLONOSCOPY WITH PROPOFOL N/A 12/04/2019   Procedure:  COLONOSCOPY WITH PROPOFOL;  Surgeon: Jonathon Bellows, MD;  Location: [facility] ENDOSCOPY;  Service: Gastroenterology;  Laterality: N/A;   COLONOSCOPY WITH PROPOFOL N/A 06/13/2021   Procedure: COLONOSCOPY WITH PROPOFOL;  Surgeon: Jonathon Bellows, MD;  Location: [facility] ENDOSCOPY;  Service: Gastroenterology;  Laterality: N/A;   ESOPHAGOGASTRODUODENOSCOPY N/A 03/01/2022   Procedure: ESOPHAGOGASTRODUODENOSCOPY (EGD);  Surgeon: Clarene Essex, MD;  Location: [facility] ENDOSCOPY;  Service: Gastroenterology;  Laterality: N/A;   GIVENS CAPSULE STUDY Left 03/10/2022   Procedure: GIVENS CAPSULE STUDY;  Surgeon: Arta Silence, MD;  Location: [facility] ENDOSCOPY;  Service: Gastroenterology;  Laterality: Left;   INSERT / REPLACE / REMOVE PACEMAKER  01/24/2013    Medtronic Adapta L dual-chamber pacemaker, serial number [id number]    LEFT HEART CATH AND CORONARY ANGIOGRAPHY N/A 06/07/2018   Procedure: LEFT HEART CATH AND CORONARY ANGIOGRAPHY;  Surgeon: [name], MD;  Location: [facility];  Service: Cardiovascular;  Laterality: N/A;   LEFT HEART CATHETERIZATION WITH CORONARY ANGIOGRAM N/A 03/17/2013   Procedure: LEFT HEART CATHETERIZATION WITH CORONARY ANGIOGRAM;  Surgeon: Burnell Blanks, MD; LAD mild dz, D1 branch 100%, inferior branch 99%, CFX OK, RCA 50%, EF 65%     LOOP RECORDER IMPLANT  2002   PERMANENT PACEMAKER INSERTION N/A 01/24/2013   Procedure: PERMANENT PACEMAKER INSERTION;  Surgeon: [name], MD;  Location: [facility] CATH LAB;  Service: Cardiovascular;  Laterality: N/A;   TIBIAL TUBERCLERPLASTY  ~ 2003     Current Outpatient Medications  Medication Sig Dispense Refill   albuterol (VENTOLIN HFA) 108 (90 Base) MCG/ACT inhaler INHALE 2 PUFFS BY MOUTH EVERY 4 HOURS AS NEEDED FOR WHEEZE OR FOR SHORTNESS OF BREATH 8.5 each 0   apixaban (ELIQUIS) 5 MG TABS tablet Take 1 tablet (5 mg total) by mouth 2 (two) times daily. 60 tablet 1   Ascorbic Acid (VITAMIN C) 1000 MG tablet Take 2,000 mg by mouth daily.     blood  glucose meter kit and supplies KIT Dispense based on patient and insurance preference. Use up to four times daily as directed. (FOR ICD-9 250.00, 250.01). 1 each 0   budesonide-formoterol (SYMBICORT) 160-4.5 MCG/ACT inhaler INHALE 2 PUFFS INTO THE LUNGS TWICE A DAY (Patient taking differently: Inhale 2 puffs into the lungs daily as needed (for shortness of breath and wheezing).) 10.2 each 5   fluticasone (FLONASE) 50 MCG/ACT nasal spray PLACE 1 SPRAY INTO BOTH NOSTRILS DAILY AS NEEDED FOR ALLERGIES OR RHINITIS. 48 mL 3   gabapentin (NEURONTIN) 300 MG capsule TAKE 1 CAPSULE BY MOUTH EVERY MORNING AND 2 CAPSULES BY MOUTH AT NIGHT FOR NEUROPATHY 270 capsule 3   glipiZIDE (GLUCOTROL XL) 10 MG 24 hr tablet TAKE 1 TABLET (10 MG TOTAL) BY MOUTH DAILY WITH BREAKFAST. FOR DIABETES. 90 tablet 1   glucose blood test strip 1 each by Other route as needed for other. Use to check blood sugar up to three times a day-Accu-Chek meter     isosorbide mononitrate (IMDUR) 30 MG 24 hr tablet Take 1 tablet (30 mg total) by mouth daily. 30 tablet 1   ivabradine (CORLANOR) 5 MG TABS tablet Take 2 tablets ('10mg'$  ) by mouth  two hours prior to scan 2 tablet 0   JARDIANCE 25 MG TABS tablet TAKE 1 TABLET (25 MG TOTAL) BY MOUTH DAILY BEFORE BREAKFAST. FOR DIABETES. 90 tablet 1   lisinopril (ZESTRIL) 2.5 MG tablet TAKE 1 TABLET BY MOUTH EVERY DAY 90 tablet 3   nitroGLYCERIN (NITROSTAT) 0.4 MG SL tablet Place 1 tablet (0.4 mg total) under the tongue every 5 (five) minutes as needed for chest pain. 25 tablet 0   pantoprazole (PROTONIX) 40 MG tablet Take 1 tablet (40 mg total) by mouth daily. 30 tablet 2   propranolol ER (INDERAL LA) 80 MG 24 hr capsule Take 1 capsule (80 mg total) by mouth at bedtime. For tremors 90 capsule 0   rosuvastatin (CRESTOR) 20 MG tablet TAKE 1 TABLET BY MOUTH EVERY DAY IN THE EVENING FOR CHOLESTEROL 90 tablet 3   sertraline (ZOLOFT) 50 MG tablet TAKE 1 TABLET (50 MG TOTAL) BY MOUTH DAILY. FOR ANXIETY AND  DEPRESSION. 90 tablet 2   tamsulosin (FLOMAX) 0.4 MG CAPS capsule TAKE 1 CAPSULE (0.4 MG TOTAL) BY MOUTH 2 (TWO) TIMES DAILY. 180 capsule 3   TYLENOL 500 MG tablet Take 500-1,000 mg by mouth every 6 (six) hours as needed for mild pain or headache.     No current facility-administered medications for this visit.    Allergies:   Januvia [sitagliptin] and Metformin and related   Social History:  The patient  reports that he quit smoking about 8 years ago. His smoking use included cigarettes. He has a 42.00 pack-year smoking history. He has never used smokeless tobacco. He reports that he does not currently use alcohol. He reports that he does not use drugs.   Family History:  The patient's  family history includes Alzheimer's disease in his mother; Osteoporosis in his mother.    ROS:  Please see the history of present illness.   All other systems are reviewed and negative.    PHYSICAL EXAM: VS:  BP (!) 102/58   Pulse 88   Ht '5\' 11"'$  (1.803 m)   Wt 213 lb (96.6 kg)   SpO2 97%   BMI 29.71 kg/m  , BMI Body mass index is 29.71 kg/m. GEN: Well nourished, well developed, in no acute distress  HEENT: normal  Neck: no JVD, carotid bruits, or masses Cardiac: irregularly irregular; no murmurs, rubs, or gallops,no edema  Respiratory:  clear to auscultation bilaterally, normal work of breathing GI: soft, nontender, nondistended, + BS MS: no deformity or atrophy  Skin: warm and dry  Neuro:  Strength and sensation are intact Psych: euthymic mood, full affect  EKG:  EKG is not ordered today.   Recent Labs: 03/08/2022: Magnesium 2.1 04/07/2022: ALT 10; B Natriuretic Peptide 80.2 04/24/2022: Hemoglobin 10.0; Platelets 172 04/28/2022: BUN 20; Creatinine, Ser 1.26; Potassium 3.9; Sodium 141; TSH 1.17    Lipid Panel     Component Value Date/Time   CHOL 132 10/26/2021 1243   CHOL 147 05/28/2019 1202   CHOL 125 01/23/2013 0151   TRIG 166.0 (H) 10/26/2021 1243   TRIG 208 (H) 01/23/2013 0151    TRIG 289 05/05/2008 0000   HDL 50.10 10/26/2021 1243   HDL 54 05/28/2019 1202   HDL 33 (L) 01/23/2013 0151   CHOLHDL 3 10/26/2021 1243   VLDL 33.2 10/26/2021 1243   VLDL 42 (H) 01/23/2013 0151   LDLCALC 49 10/26/2021 1243   LDLCALC 69 05/28/2019 1202   LDLCALC 56 03/28/2017 1433   LDLCALC 50 01/23/2013 0151   LDLCALC 119  05/05/2008 0000     Wt Readings from Last 3 Encounters:  05/01/22 213 lb (96.6 kg)  04/28/22 209 lb (94.8 kg)  04/24/22 208 lb (94.3 kg)      Other studies Reviewed: Additional studies/ records that were reviewed today include:    Echo:   1. Left ventricular ejection fraction, by estimation, is 50%. The left  ventricle has low normal function. Left ventricular endocardial border not  optimally defined to evaluate regional wall motion. There is mild  concentric left ventricular hypertrophy.  Left ventricular diastolic parameters are indeterminate.   2. Right ventricular systolic function is normal. The right ventricular  size is normal. There is normal pulmonary artery systolic pressure.   3. Left atrial size was mildly dilated.   4. The mitral valve is grossly normal. No evidence of mitral valve  regurgitation. No evidence of mitral stenosis.   5. The aortic valve is tricuspid. Aortic valve regurgitation is not  visualized. Aortic valve sclerosis is present, with no evidence of aortic  valve stenosis.   6. The inferior vena cava is dilated in size with >50% respiratory  variability, suggesting right atrial pressure of 8 mmHg.   Comparison(s): Apex is not as well defined in this study.   Myoview Scan 09/19/21:   Findings are consistent with no ischemia. The study is low risk.   No ST deviation was noted.   LV perfusion is abnormal. There is no evidence of ischemia. There is evidence of infarction. Defect 1: There is a small defect with moderate reduction in uptake present in the apical inferior location(s) that is fixed. There is normal wall motion in  the defect area. Cannot determine artifact vs small infarct.   Left ventricular function is normal. Nuclear stress EF: 63 %. The left ventricular ejection fraction is normal (55-65%). End diastolic cavity size is normal. End systolic cavity size is normal.   Prior study available for comparison from 12/22/2011. There are changes compared to prior study which appear to be new.   Fixed defect of mild-moderate severity at the inferior apex. With normal wall motion and overall low counts, cannot exclude artifact, but visually appears more consistent with prior infarct without ischemic. Given normal EF and small defect, this is a low risk study.  ASSESSMENT AND PLAN:  1.  Permanent atrial fibrillation I have seen [name] Cook is a 72 y.o. male in the office today who has been referred for a Watchman left atrial appendage closure device.  He has a history of permanent atrial fibrillation.  His atrial fibrillation-related risk scores are outlined below:   CHA2DS2-VASc Score = 4   This indicates a 4.8% annual risk of stroke. The patient's score is based upon: CHF History: 0 HTN History: 1 Diabetes History: 1 Stroke History: 0 Vascular Disease History: 1 Age Score: 1 Gender Score: 0      HAS-BLED score = 2 Hypertension No  Abnormal renal and liver function (Dialysis, transplant, Cr >2.26 mg/dL /Cirrhosis or Bilirubin >2x Normal or AST/ALT/AP >3x Normal) No  Stroke No  Bleeding Yes  Labile INR (Unstable/high INR) No  Elderly (>65) Yes  Drugs or alcohol (? 8 drinks/week, anti-plt or NSAID) No   Unfortunately, He is not felt to be a good long term anticoagulation candidate secondary to GI bleeding.  The patients chart has been reviewed and I along with their referring cardiologist feel that they would be a candidate for short term oral anticoagulation.  Procedural risks for the Watchman implant have  been reviewed with the patient including a 1% risk of stroke, 1% risk of perforation or pericardial  effusion, 0.1% risk of device embolization.  Given the patient's poor candidacy for long-term oral anticoagulation, and his ability to tolerate short term oral anticoagulation, I have recommended the Watchman FLX left atrial appendage closure device through a shared decision making conversation with the patient. The procedural animation is demonstrated to the patient today. Risks, potential benefits, post-procedural imaging assessment, and post-procedural anticoagulation is discussed. Because he had a pulmonary embolus in November 2023, he will require 6 months of anticoagulation. If he undergoes Watchman implantation this Spring, he could be continued on warfarin until he is 6 months out from his PE, then could be transitioned to clopidogrel monotherapy thereafter. Prior to the procedure, I would like to obtain a gated CT scan of the chest with contrast timed for PV/LAA visualization to determine if he has suitable anatomy for Watchman implantation. Once the CTA is completed I will review to confirm the patient continues to be a suitable candidate for Watchman implant. If so, we will schedule for the implant procedure at the first available date.  Current medicines are reviewed at length with the patient today.   The patient does not have concerns regarding his medicines.  The following changes were made today:  none  Labs/ tests ordered today include:  Orders Placed This Encounter  Procedures   CT CARDIAC MORPH/PULM VEIN W/CM&W/O CA SCORE     Signed, [name], MD 05/02/2022  2:28 PM     [facility] [address] [place] [address] [phone] (office) [phone] (fax)

## 2022-05-02 ENCOUNTER — Telehealth: Payer: Self-pay | Admitting: Primary Care

## 2022-05-02 ENCOUNTER — Encounter: Payer: Self-pay | Admitting: Cardiovascular Disease

## 2022-05-02 DIAGNOSIS — R251 Tremor, unspecified: Secondary | ICD-10-CM

## 2022-05-02 MED ORDER — PROPRANOLOL HCL ER 80 MG PO CP24: 80.0000 mg | ORAL_CAPSULE | Freq: Every day | ORAL | 0 refills | Status: DC

## 2022-05-02 NOTE — Telephone Encounter (Signed)
Please call patient:  I spoke with his cardiologist regarding the propranolol medication for his hand shakes. His cardiologist is okay with [place] proceeding.   If he is still interested then I will send a prescription for propranolol ER 80 mg. This is taken at bedtime every night. It may take a few weeks for him to notice a difference. If no improvement then have him let us know.  Let me know once you've spoken with him.

## 2022-05-02 NOTE — Telephone Encounter (Signed)
Noted, Rx sent to pharmacy.

## 2022-05-02 NOTE — Telephone Encounter (Signed)
Called patient and reviewed all information. Patient verbalized understanding. He would like the Rx sent in for propanolol ER '80mg'$  tab. He will let us know if he sees no improvement in a few weeks

## 2022-05-06 ENCOUNTER — Other Ambulatory Visit: Payer: Self-pay | Admitting: Physician Assistant

## 2022-05-08 ENCOUNTER — Telehealth: Payer: Self-pay

## 2022-05-08 ENCOUNTER — Ambulatory Visit
Admission: RE | Admit: 2022-05-08 | Discharge: 2022-05-08 | Disposition: A | Discharge status: Home / Self Care | Payer: Medicare Other | Source: Ambulatory Visit | Attending: Acute Care | Admitting: Acute Care

## 2022-05-08 DIAGNOSIS — Z87891 Personal history of nicotine dependence: Secondary | ICD-10-CM | POA: Diagnosis not present

## 2022-05-08 NOTE — Progress Notes (Signed)
Care Management & Coordination Services Pharmacy Team  Reason for Encounter: Appointment Reminder  Contacted patient on 05/08/2022    Hospital visits:  None in previous 6 months  04/24/22- [place]- Shortness of breath-Chest x-ray per my review and interpretation shows no evidence of pneumonia or pneumothorax. Overall patient given reassurance and discharged from the ED in good condition.   04/07/22-[place]-weakness-Hemoglobin is decreased at 7.4, however the past 2 weeks it appears he has had a slight drop from 9 to 7-1/2. Believe that most of his symptoms are due to anemia. Discussed this with the hospitalist, and they pushed back on the admission, noting that he has normal vital signs, is on room air, not tachycardic. They recommended transfusion of 1 unit here and discharged with outpatient follow-up. Discussed with the patient, he is initially hesitant with this plan and ultimately is okay with the plan. Will plan for transfusion of 1 unit of RBCs, the oncoming team will reassess after, and if he is feeling better, will be stable for discharge home.   03/28/22-[place]-dizziness,CBC reassuring Chest x-ray personally reviewed and interpreted which is negative for signs of pneumonia, pneumothorax or widened mediastinum. EKG demonstrates ventricular paced rhythm. No evidence of acute ischemia. Dr. Doren Custard notes patient is stable for discharge; however recommends hydration at home and to stop taking imdur and recheck BP at home to see if it is improved. Follow-up with PCP for BP recheck and to discuss BP medications. Dizziness and SOB possibly multifactioral from polypharmacy and anemia.   Medications: Outpatient Encounter Medications as of 05/08/2022  Medication Sig   albuterol (VENTOLIN HFA) 108 (90 Base) MCG/ACT inhaler INHALE 2 PUFFS BY MOUTH EVERY 4 HOURS AS NEEDED FOR WHEEZE OR FOR SHORTNESS OF BREATH   apixaban (ELIQUIS) 5 MG TABS tablet Take 1 tablet (5 mg total) by mouth 2 (two) times daily.    Ascorbic Acid (VITAMIN C) 1000 MG tablet Take 2,000 mg by mouth daily.   blood glucose meter kit and supplies KIT Dispense based on patient and insurance preference. Use up to four times daily as directed. (FOR ICD-9 250.00, 250.01).   budesonide-formoterol (SYMBICORT) 160-4.5 MCG/ACT inhaler INHALE 2 PUFFS INTO THE LUNGS TWICE A DAY (Patient taking differently: Inhale 2 puffs into the lungs daily as needed (for shortness of breath and wheezing).)   fluticasone (FLONASE) 50 MCG/ACT nasal spray PLACE 1 SPRAY INTO BOTH NOSTRILS DAILY AS NEEDED FOR ALLERGIES OR RHINITIS.   gabapentin (NEURONTIN) 300 MG capsule TAKE 1 CAPSULE BY MOUTH EVERY MORNING AND 2 CAPSULES BY MOUTH AT NIGHT FOR NEUROPATHY   glipiZIDE (GLUCOTROL XL) 10 MG 24 hr tablet TAKE 1 TABLET (10 MG TOTAL) BY MOUTH DAILY WITH BREAKFAST. FOR DIABETES.   glucose blood test strip 1 each by Other route as needed for other. Use to check blood sugar up to three times a day-Accu-Chek meter   isosorbide mononitrate (IMDUR) 30 MG 24 hr tablet Take 1 tablet (30 mg total) by mouth daily.   ivabradine (CORLANOR) 5 MG TABS tablet Take 2 tablets ('10mg'$  ) by mouth two hours prior to scan   JARDIANCE 25 MG TABS tablet TAKE 1 TABLET (25 MG TOTAL) BY MOUTH DAILY BEFORE BREAKFAST. FOR DIABETES.   lisinopril (ZESTRIL) 2.5 MG tablet TAKE 1 TABLET BY MOUTH EVERY DAY   nitroGLYCERIN (NITROSTAT) 0.4 MG SL tablet Place 1 tablet (0.4 mg total) under the tongue every 5 (five) minutes as needed for chest pain.   pantoprazole (PROTONIX) 40 MG tablet Take 1 tablet (40  mg total) by mouth daily.   propranolol ER (INDERAL LA) 80 MG 24 hr capsule Take 1 capsule (80 mg total) by mouth at bedtime. For tremors   rosuvastatin (CRESTOR) 20 MG tablet TAKE 1 TABLET BY MOUTH EVERY DAY IN THE EVENING FOR CHOLESTEROL   sertraline (ZOLOFT) 50 MG tablet TAKE 1 TABLET (50 MG TOTAL) BY MOUTH DAILY. FOR ANXIETY AND DEPRESSION.   tamsulosin (FLOMAX) 0.4 MG CAPS capsule TAKE 1 CAPSULE (0.4  MG TOTAL) BY MOUTH 2 (TWO) TIMES DAILY.   TYLENOL 500 MG tablet Take 500-1,000 mg by mouth every 6 (six) hours as needed for mild pain or headache.   No facility-administered encounter medications on file as of 05/08/2022.   Lab Results  Component Value Date/Time   HGBA1C 5.8 (A) 04/28/2022 11:06 AM   HGBA1C 6.4 (H) 03/08/2022 06:00 PM   HGBA1C 7.5 (H) 10/26/2021 12:43 PM   HGBA1C 6.4 (H) 11/27/2016 11:33 AM   HGBA1C 7.1 (H) 08/21/2016 10:25 AM   MICROALBUR 1.1 05/01/2022 12:45 PM   MICROALBUR <0.7 09/21/2017 01:06 PM    BP Readings from Last 3 Encounters:  05/01/22 (!) 102/58  04/28/22 102/60  04/25/22 110/75     Patient contacted to confirm telephone appointment with Charlene Brooke,   PharmD, on 05/11/22 at 11:45.  Do you have any problems getting your medications? No   What is your top health concern you would like to discuss at your upcoming visit? No concerns at this time   Have you seen any other providers since your last visit with PCP? Yes-cardiology    Star Rating Drugs:  Medication:  Last Fill: Day Supply Glipizide '10mg'$  03/07/22 90 Jardiance '25mg'$  03/19/22 90 Lisinopril 2.'5mg'$  05/01/22 90 Rosuvastatin '20mg'$  04/03/22 90   Care Gaps: Annual wellness visit in last year? Yes  If Diabetic: Last eye exam / retinopathy screening:UTD Last diabetic foot exam: overdue   Charlene Brooke, PharmD notified  Avel Sensor, [place] Assistant [phone]

## 2022-05-09 ENCOUNTER — Other Ambulatory Visit (HOSPITAL_COMMUNITY): Payer: Self-pay

## 2022-05-09 ENCOUNTER — Other Ambulatory Visit: Payer: Self-pay

## 2022-05-09 ENCOUNTER — Other Ambulatory Visit: Payer: Self-pay | Admitting: Primary Care

## 2022-05-09 DIAGNOSIS — J439 Emphysema, unspecified: Secondary | ICD-10-CM

## 2022-05-09 DIAGNOSIS — R251 Tremor, unspecified: Secondary | ICD-10-CM

## 2022-05-09 DIAGNOSIS — I200 Unstable angina: Secondary | ICD-10-CM

## 2022-05-09 MED ORDER — ISOSORBIDE MONONITRATE ER 30 MG PO TB24: 30.0000 mg | ORAL_TABLET | Freq: Every day | ORAL | 1 refills | Status: DC

## 2022-05-10 ENCOUNTER — Other Ambulatory Visit: Payer: Self-pay

## 2022-05-10 DIAGNOSIS — Z87891 Personal history of nicotine dependence: Secondary | ICD-10-CM

## 2022-05-11 ENCOUNTER — Other Ambulatory Visit (HOSPITAL_COMMUNITY): Payer: Self-pay

## 2022-05-11 ENCOUNTER — Telehealth (HOSPITAL_COMMUNITY): Payer: Self-pay | Admitting: *Deleted

## 2022-05-11 ENCOUNTER — Ambulatory Visit: Payer: Medicare Other | Admitting: Pharmacist

## 2022-05-11 NOTE — Patient Instructions (Signed)
Visit Information  Phone number for Pharmacist: [phone]  Thank you for meeting with me to discuss your medications! Below is a summary of what we talked about during the visit:   Recommendations/Changes made from today's visit: -Advised pt to drink plenty of fluids today - 8 oz water now and then continue to sip on it throughout the day, aim for at least 64 oz fluids daily, preferably ~100 oz -Advised to continue monitoring BP at home twice daily; contact provider with BP < 100/60 or HR < 60 -Delay next A1c until 3 months after last blood transfusion (~07/07/22) to ensure accuracy  Follow up plan: -Health Concierge will call patient 1 week for BP/HR update -Pharmacist follow up televisit scheduled for 1 month -Cardiac CT 05/12/22; Urology appt 06/21/22, PCP appt 10/27/22 (CPE)   Charlene Brooke, PharmD, [facility] Clinical Pharmacist [facility] [phone]

## 2022-05-11 NOTE — Telephone Encounter (Signed)
Reaching out to patient to offer assistance regarding upcoming cardiac imaging study; pt verbalizes understanding of appt date/time, parking situation and where to check in, pre-test NPO status and medications ordered, and verified current allergies; name and call back number provided for further questions should they arise  Gordy Clement RN Navigator Cardiac Imaging [facility] [phone] office [phone] cell  Patient to take '10mg'$  ivabradine two hours prior to his cardiac CT scan.  He is aware to arrive at 2pm.

## 2022-05-11 NOTE — Progress Notes (Signed)
Care Management & Coordination Services Pharmacy Note  05/11/2022 Name:  [name] Cook MRN:  [id number] DOB:  [birth year]  Summary: -Reviewed recent ED visits - pt has been to ED 3 times in last ~6 weeks for dizziness/weakness/SOB, related to anemia and hypotension. He held Imdur for a few weeks but has restarted it. He also recently started propranolol ER 80 mg for tremor. He has not been checking BP/HR at home, during call he checked: BP 99/63, HR 100; he denies dizziness, SOB. Discussed propranolol and imdur, while not prescribed specifically for BP, can lower BP and this needs to be monitored. -DM: A1c 5.8% (04/28/22) at goal, though query accuracy of A1c in setting of recent blood loss and transfusion (1 u PRBC 04/07/22, 4 u PRBC 03/13/22); pt reports fasting BG 136 today, he denies BG < 100   Recommendations/Changes made from today's visit: -Advised pt to drink plenty of fluids today - 8 oz water now and then continue to sip on it throughout the day, aim for at least 64 oz fluids daily, preferably ~100 oz -Advised to continue monitoring BP at home twice daily; contact provider with BP < 100/60 or HR < 60 -Delay next A1c until 3 months after last blood transfusion (~07/07/22) to ensure accuracy  Follow up plan: -Health Concierge will call patient 1 week for BP/HR update -Pharmacist follow up televisit scheduled for 1 month -Cardiac CT 05/12/22; Urology appt 06/21/22, PCP appt 10/27/22 (CPE)     Subjective: [name] Cook is an 72 y.o. year old male who is a primary patient of [name], NP.  The care coordination team was consulted for assistance with disease management and care coordination needs.    Engaged with patient by telephone for follow up visit.  Recent office visits: 05/02/22 TE - ok to start propranolol per cardiology. Rx propranolol ER 80 mg HS.  04/28/22 NP Allie Bossier OV: f/u - tremor. Consider propranolol, check with cardiology first.  03/22/22 NP Allie Bossier OV: hospital  f/u  - start pantoprazole.   08/19/21 NP Matt Charmian Muff OV: acute cough - rx'd cetirizine, benzonate. Covid negaive. 07/28/21 NP Alma Friendly OV: f/u DM, depression. Increased duloxetine to 90 mg/day. Increase gabapentin to 2 cap at night. Work on diet changes.   06/23/21 NP Alma Friendly OV: f/u DM. Increase Jardiance to 25 mg daily. F/u with GI regarding diarrhea. Lipase elevated, ordered CT scan - negative for pancreatitis.   06/08/21 Phone note - BG has elevated to 212. Started glipizide 10 mg. 05/27/21 NP Alma Friendly OV: c/o diarrhea. Stop metformin. Complete stool sample kit. Referred for colonoscopy. Lipase elevated, concern for pancreatitis, advised to stop Januvia as well.  Recent consult visits: 05/01/22 Dr Burt Knack (Cardiology): Afib - requires 6 months AC from PE 02/2022. Continue Eliquis. Start ivabradine 10 mg BID  03/20/22 PA Clint Fenton (Afib clinic): refer to [facility] team. Continue Eliquis.   03/15/22 PA Ermalinda Barrios (Cardiology): f/u - d/c clopdiogrel (now on Eliquis, recent GIB). Weak/dizzy - d/c lasix and reduce imdur to 30 mg  Hospital visits: 04/24/21 ED visit [facility]): SOB - HGB 10. F/u PCP.  04/07/22 ED visit [facility]): weakness, anemia. HGB 7.4. Given 1 u PRBC.   03/28/22 ED visit [facility]): dizzines, SOB. Orthostatic in ED (120/70 to 90/60). Given IV fluids. Hold Imdur. F/u PCP.  03/08/22 - 03/12/22 Admission ([facility]): GI Bleed, Acute PE/DVT. Transition to Eliquis. Hold Plavix.   02/27/22 - 03/03/22 Admission ([facility]): Upper GI bleed, on warfarin and Plavix. HGB 9.7  on admission down from 15. Received 4 units PRBC. Changed to Pantoprazole 40 mg BID. Resumed Plavix but hold Coumadin until clinic f/u. HGB 8.1 at discharge.   Objective:  Lab Results  Component Value Date   CREATININE 1.26 04/28/2022   BUN 20 04/28/2022   GFR 58.02 (L) 04/28/2022   GFRNONAA 52 (L) 04/24/2022   GFRAA 72 06/23/2019   NA 141 04/28/2022   K 3.9 04/28/2022   CALCIUM 9.0 04/28/2022   CO2 27  04/28/2022   GLUCOSE 141 (H) 04/28/2022    Lab Results  Component Value Date/Time   HGBA1C 5.8 (A) 04/28/2022 11:06 AM   HGBA1C 6.4 (H) 03/08/2022 06:00 PM   HGBA1C 7.5 (H) 10/26/2021 12:43 PM   HGBA1C 6.4 (H) 11/27/2016 11:33 AM   HGBA1C 7.1 (H) 08/21/2016 10:25 AM   GFR 58.02 (L) 04/28/2022 11:31 AM   GFR 71.64 10/26/2021 12:43 PM   MICROALBUR 1.1 05/01/2022 12:45 PM   MICROALBUR <0.7 09/21/2017 01:06 PM    Last diabetic Eye exam:  Lab Results  Component Value Date/Time   HMDIABEYEEXA No Retinopathy 03/30/2022 12:00 AM    Last diabetic Foot exam: No results found for: "HMDIABFOOTEX"   Lab Results  Component Value Date   CHOL 132 10/26/2021   HDL 50.10 10/26/2021   LDLCALC 49 10/26/2021   TRIG 166.0 (H) 10/26/2021   CHOLHDL 3 10/26/2021       Latest Ref Rng & Units 04/07/2022    8:14 PM 03/28/2022    2:33 PM 03/09/2022    1:03 AM  Hepatic Function  Total Protein 6.5 - 8.1 g/dL 5.1  6.0  5.4   Albumin 3.5 - 5.0 g/dL 3.5  3.6  3.3   AST 15 - 41 U/L '17  17  13   '$ ALT 0 - 44 U/L '10  14  16   '$ Alk Phosphatase 38 - 126 U/L 42  42  40   Total Bilirubin 0.3 - 1.2 mg/dL 0.3  0.5  0.6   Bilirubin, Direct 0.0 - 0.2 mg/dL  <0.1      Lab Results  Component Value Date/Time   TSH 1.17 04/28/2022 11:31 AM   TSH 2.030 04/15/2019 10:05 AM   FREET4 0.88 04/28/2022 11:31 AM   FREET4 1.38 03/14/2013 11:12 AM       Latest Ref Rng & Units 04/24/2022    9:58 PM 04/07/2022    8:14 PM 03/28/2022    2:33 PM  CBC  WBC 4.0 - 10.5 K/uL 4.6  3.9  4.1   Hemoglobin 13.0 - 17.0 g/dL 10.0  7.4  8.5   Hematocrit 39.0 - 52.0 % 32.4  26.0  27.5   Platelets 150 - 400 K/uL 172  146  163     Lab Results  Component Value Date/Time   VD25OH 32 10/27/2015 10:54 AM    Clinical ASCVD: Yes  The ASCVD Risk score (Arnett DK, et al., 2019) failed to calculate for the following reasons:   The patient has a prior MI or stroke diagnosis    CHA2DS2/VAS Stroke Risk Points  Current as of yesterday      5 >= 2 Points: High Risk  1 - 1.99 Points: Medium Risk  0 Points: Low Risk    Last Change: N/A        04/28/2022   10:47 AM 03/22/2022   10:27 AM 10/07/2021   11:54 AM  Depression screen PHQ 2/9  Decreased Interest 0 0 0  Down, Depressed, Hopeless 0  0 0  PHQ - 2 Score 0 0 0  Altered sleeping  0 0  Tired, decreased energy  2 0  Change in appetite  0 0  Feeling bad or failure about yourself   0 0  Trouble concentrating  0 0  Moving slowly or fidgety/restless  0 0  Suicidal thoughts  0 0  PHQ-9 Score  2 0  Difficult doing work/chores  Not difficult at all Not difficult at all     Social History   Tobacco Use  Smoking Status Former   Packs/day: 1.00   Years: 42.00   Total pack years: 42.00   Types: Cigarettes   Quit date: 12/30/2013   Years since quitting: 8.3  Smokeless Tobacco Never  Tobacco Comments   Former smoker 03/20/22   BP Readings from Last 3 Encounters:  05/01/22 (!) 102/58  04/28/22 102/60  04/25/22 110/75   Pulse Readings from Last 3 Encounters:  05/01/22 88  04/28/22 91  04/25/22 90   Wt Readings from Last 3 Encounters:  05/01/22 213 lb (96.6 kg)  04/28/22 209 lb (94.8 kg)  04/24/22 208 lb (94.3 kg)   BMI Readings from Last 3 Encounters:  05/01/22 29.71 kg/m  04/28/22 29.15 kg/m  04/24/22 29.01 kg/m    Allergies  Allergen Reactions   Januvia [Sitagliptin] Other (See Comments)    Elevated lipase   Metformin And Related Diarrhea    Medications Reviewed Today     Reviewed by Charlton Haws, [facility] (Pharmacist) on 05/11/22 at 1222  Med List Status: <None>   Medication Order Taking? Sig Documenting Provider Last Dose Status Informant  albuterol (VENTOLIN HFA) 108 (90 Base) MCG/ACT inhaler [id number] Yes INHALE 2 PUFFS BY MOUTH EVERY 4 HOURS AS NEEDED FOR WHEEZE OR FOR SHORTNESS OF BREATH [name], NP Taking Active   apixaban (ELIQUIS) 5 MG TABS tablet [id number] Yes Take 1 tablet (5 mg total) by mouth 2 (two) times daily.  Geradine Girt, DO Taking Active Self  Ascorbic Acid (VITAMIN C) 1000 MG tablet [id number] Yes Take 2,000 mg by mouth daily. [provider] Taking Active Self  blood glucose meter kit and supplies KIT [id number] Yes Dispense based on patient and insurance preference. Use up to four times daily as directed. (FOR ICD-9 250.00, 250.01). [name], NP Taking Active Self  budesonide-formoterol [facility]) 160-4.5 MCG/ACT inhaler [id number] Yes INHALE 2 PUFFS INTO THE LUNGS TWICE A DAY  Patient taking differently: Inhale 2 puffs into the lungs daily as needed (for shortness of breath and wheezing).   [name], NP Taking Active Self  fluticasone (FLONASE) 50 MCG/ACT nasal spray [id number] Yes PLACE 1 SPRAY INTO BOTH NOSTRILS DAILY AS NEEDED FOR ALLERGIES OR RHINITIS. [name], NP Taking Active Self  gabapentin (NEURONTIN) 300 MG capsule [id number] Yes TAKE 1 CAPSULE BY MOUTH EVERY MORNING AND 2 CAPSULES BY MOUTH AT NIGHT FOR NEUROPATHY [name], NP Taking Active Self  glipiZIDE (GLUCOTROL XL) 10 MG 24 hr tablet [id number] Yes TAKE 1 TABLET (10 MG TOTAL) BY MOUTH DAILY WITH BREAKFAST. FOR DIABETES. [name], NP Taking Active Self  glucose blood test strip [id number] Yes 1 each by Other route as needed for other. Use to check blood sugar up to three times a day-Accu-Chek meter [provider] Taking Active Self  isosorbide mononitrate (IMDUR) 30 MG 24 hr tablet [id number] Yes Take 1 tablet (30 mg total) by mouth daily. Imogene Burn, PA-C Taking Active   ivabradine (  CORLANOR) 5 MG TABS tablet [id number] Yes Take 2 tablets ('10mg'$  ) by mouth two hours prior to scan [name], MD Taking Active   JARDIANCE 25 MG TABS tablet [id number] Yes TAKE 1 TABLET (25 MG TOTAL) BY MOUTH DAILY BEFORE BREAKFAST. FOR DIABETES. [name], NP Taking Active Self  lisinopril (ZESTRIL) 2.5 MG tablet [id number] Yes TAKE 1 TABLET BY MOUTH EVERY DAY [name], MD Taking Active Self  nitroGLYCERIN (NITROSTAT) 0.4 MG SL tablet [id number] Yes Place 1 tablet (0.4 mg total) under the tongue every 5 (five) minutes as needed for chest pain. Do not exceed 3 tablets in 24 hours. [name], NP Taking Active   pantoprazole (PROTONIX) 40 MG tablet [id number] Yes Take 1 tablet (40 mg total) by mouth daily. Geradine Girt, DO Taking Active Self  propranolol ER (INDERAL LA) 80 MG 24 hr capsule [id number] Yes Take 1 capsule (80 mg total) by mouth at bedtime. For tremors [name], NP Taking Active   rosuvastatin (CRESTOR) 20 MG tablet [id number] Yes TAKE 1 TABLET BY MOUTH EVERY DAY IN THE EVENING FOR CHOLESTEROL [name], NP Taking Active Self  sertraline (ZOLOFT) 50 MG tablet [id number] Yes TAKE 1 TABLET (50 MG TOTAL) BY MOUTH DAILY. FOR ANXIETY AND DEPRESSION. [name], NP Taking Active Self  tamsulosin (FLOMAX) 0.4 MG CAPS capsule [id number] Yes TAKE 1 CAPSULE (0.4 MG TOTAL) BY MOUTH 2 (TWO) TIMES DAILY. Noreene Filbert, MD Taking Active Self  TYLENOL 500 MG tablet [id number] Yes Take 500-1,000 mg by mouth every 6 (six) hours as needed for mild pain or headache. [provider] Taking Active Self            SDOH:  (Social Determinants of Health) assessments and interventions performed: No SDOH Interventions    Flowsheet Row Telephone from 01/11/2022 in [place] from 10/07/2021 in [place] at [facility] Visit from 04/08/2021 in [place] at [place] Management from 12/07/2020 in [place] at [place] from 10/06/2020 in [place] at [place] Visit from 02/06/2020 in [place] at [place] Interventions Intervention Not Indicated Intervention Not Indicated -- --  -- --  Housing Interventions Intervention Not Indicated Intervention Not Indicated -- -- -- --  Transportation Interventions Intervention Not Indicated Intervention Not Indicated -- -- -- --  Depression Interventions/Treatment  -- -- Counseling -- PHQ2-9 Score <4 Follow-up Not Indicated Counseling  Financial Strain Interventions -- Intervention Not Indicated -- Intervention Not Indicated -- --  Physical Activity Interventions -- Intervention Not Indicated -- -- -- --  Stress Interventions -- Intervention Not Indicated -- -- -- --  Social Connections Interventions -- Intervention Not Indicated -- -- -- --       Medication Assistance: None required.  Patient affirms current coverage meets needs. [facility] Dual complete)  Medication Access: Within the past 30 days, how often has patient missed a dose of medication? 0 Is a pillbox or other method used to improve adherence? Yes  Factors that may affect medication adherence? no barriers identified Are meds synced by current pharmacy? No  Are meds delivered by current pharmacy? No  Does patient experience delays in picking up medications due to transportation concerns? No   [facility] Services Reviewed: Is patient disadvantaged to use [facility]?: No  Current Rx insurance plan: U[employer] dual complete Name and location of Current pharmacy:  [id number] [place] [address]Phone: [phone]: [address]Phone: [phone]: [phone] [facility] services reviewed with patient today?: No  Patient requests to transfer care to [facility]?: No  Reason patient declined to change pharmacies: Not mentioned at this visit  Compliance/Adherence/Medication fill history: Care Gaps: None  Star-Rating Drugs: Glipizide - PDC 100% Jardiance - PDC 99% Lisinopril - PDC  100% Rosuvastatin - PDC 100%   Assessment/Plan   Current Barriers:  Recent GI bleeding on Eliquis and Plavix   Hypertension / LVH (BP goal <130/80) -Possibly over-controlled - BP has been low recently when pt was in ED and clinic visits, he is not checking at home but checked during call and it was low; he started propranolol recently and reports he is taking isosorbide again after it was on hold for a few weeks; he reports he has some occasional spells of lightheadedness but not as bad as when he went to ED -Current home BP: 99/63, P 100 -LVEF 50% (02/2022); mild Left ventricular hypertrophy -Hx OSA, unable to tolerate CPAP -Current treatment: Lisinopril 2.5 mg daily -Appropriate, Effective, Safe, Accessible Isosorbide Mononitrate 60 mg daily - Appropriate, Effective, Query Safe Propranolol ER 80 mg daily HS (tremor) -Appropriate, Effective, Query Safe Jardiance 25 mg daily - Appropriate, Effective, Safe, Accessible -Medications previously tried: furosemide -Educated on BP goals and benefits of medications for prevention of heart attack, stroke and kidney damage; -Discussed propranolol, while prescribed for tremor, is a beta blocker and can reduce HR and BP; advised pt to monitor BP/HR daily and contact provider if BP < 100/60 or HR < 60 -Advised pt to drink fluids today to improve BP; advised to check BP twice daily for a week or so, if BP continues to be < 100/60 would advise holding Imdur or propranolol -Counseled to monitor BP at home daily for a few weeks -Recommended to continue current medication   Atrial Fibrillation (Goal: prevent stroke and major bleeding) -Controlled - per patient report no symptoms, INR stable -Followed by cardiology; pursuing Watchman procedure Spring 2024 (needs 6 mos anticoagulation following PE 02/2022) -CHADSVASC: 5 -Current treatment: Eliquis 10 mg BID, start 5 mg BID 03/18/22 - Appropriate, Effective, Safe, Accessible Propranolol ER 80 mg daily HS  (tremor) -Appropriate, Effective, Query Safe -Medications previously tried: metoprolol; warfarin (GI bleed x 2) -Counseled on importance of adherence to anticoagulant exactly as prescribed; avoidance of NSAIDs due to increased bleeding risk with anticoagulants; -Discussed propranolol, as above -Recommended to continue current medication  Acute PE/DVT (Goal: prevent recurrence) -Stable - pt affirms compliance with Eliquis and has stopped warfarin; he has starter pack for Eliquis and is still taking 10 mg BID in first 7 days, plans to transition to 5 mg BID 12/2 per package instructions; -GI bleed 11/13 on warfarin/Plavix > held warfarin. Readmitted 11/22 with GI bleed and PE > start Eliquis, hold Plavix. -Current treatment  Eliquis 5 mg BID -Appropriate, Effective, Safe, Accessible -Medications previously tried: warfarin -Discussed Eliquis cost - he reports copay was $0; he has Medicare/Medicaid so cost should not be an issue going forward -Of note patient is still taking clopidogrel - see HLD/CAD discussion below -Recommended to continue current medication  Hyperlipidemia/CAD: (LDL goal < 70) -Controlled - LDL 49 (10/2021) at goal; TRIG 166 (10/2021)  slightly elevated -Hx CAD, stent Feb 2020, followed by cardiology -Pt denies any chest pain or abnormal symptoms. Affirms daily adherence.  -Current treatment: Rosuvastatin 20 mg daily - Appropriate, Effective, Safe, Accessible Isosorbide Mononitrate 60 mg daily -.Appropriate, Effective, Query Safe Nitroglycerin - PRN  -Appropriate, Effective, Safe, Accessible -Medications previously tried: fish oil -Educated on Cholesterol goals;  -Recommend to continue current medication   Diabetes (A1c goal <7%) -Query controlled - A1c 5.8% (04/28/22) at goal, though query accuracy of A1c in setting of recent blood loss and transfusion (1 u PRBC 04/07/22, 4 u PRBC 03/13/22) -Current home glucose readings - checks fasting 3 days per week fasting glucose:  136; nothing < 100 -Current medications: Jardiance 25 mg daily - Appropriate, Effective, Safe, Accessible Glipizide XL 10 mg daily - Appropriate, Effective, Safe, Accessible -Medications previously tried: metformin (diarrhea), Januvia (elevated lipase) -Reviewed benefits of medications and fasting BG goal < 130 -Discussed A1c is unreliable following blood transfusion, recommend to wait 3 months after last transfusion to re-check A1c for best results -Recommend to continue current medication  COPD/OSA (Goal: control symptoms and prevent exacerbations) -Controlled -  per patient report, no issues with breathing/wheezing -No active tobacco use. Quit 2013. Low dose CT 05/06/21 normal.  -No PFTs found. No CAT score documented. -Current treatment  Symbicort 160-4.5 mcg/act - 2 puff BID (takes PRN) - Appropriate, Effective, Safe, Accessible Albuterol PRN - Appropriate, Effective, Safe, Accessible -Medications previously tried: none reported -Exacerbations requiring treatment in last 6 months: none -Patient denies consistent use of maintenance inhaler. He uses Symbicort PRN -Frequency of rescue inhaler use: ~ 1-2 x/week -Counseled on Differences between maintenance and rescue inhalers -Recommended to continue current medication for now.  Depression/Insomnia (Goal: Improve mood, sleep) -Controlled - pt reports improvement in mood since duloxetine was increased -Caffeine: 2 cups coffee, drinks coke zero - 2 cans/day. No alcohol or tobacco use -PHQ9= 2 (03/2021) - no/minimal depression -Current treatment: Duloxetine 60 mg daily - Appropriate, Effective, Safe, Accessible Duloxetine 30 mg daily -Appropriate, Effective, Safe, Accessible -Medications previously tried/failed: Lexapro 10 mg, Bupropion 150 mg BID, citalopram -Recommended to continue current medication  Health Maintenance -Vaccine gaps: Prevnar20 or PPSV23, TDAP -Hx prostate cancer, s/p radiation and Lupron early 2020   Charlene Brooke, PharmD, BCACP Clinical Pharmacist [facility] at [facility] [phone]

## 2022-05-12 ENCOUNTER — Ambulatory Visit (HOSPITAL_COMMUNITY)
Admission: RE | Admit: 2022-05-12 | Discharge: 2022-05-12 | Disposition: A | Discharge status: Home / Self Care | Payer: 59 | Source: Ambulatory Visit | Attending: Cardiovascular Disease | Admitting: Cardiovascular Disease

## 2022-05-12 DIAGNOSIS — I4821 Permanent atrial fibrillation: Secondary | ICD-10-CM | POA: Diagnosis not present

## 2022-05-12 MED ORDER — IOHEXOL 350 MG/ML SOLN
95.0000 mL | Freq: Once | INTRAVENOUS | Status: AC | PRN
  Administered 2022-05-12: 95 mL via INTRAVENOUS

## 2022-05-16 NOTE — Telephone Encounter (Signed)
This encounter was created in error - please disregard.

## 2022-05-17 ENCOUNTER — Telehealth: Payer: Self-pay

## 2022-05-17 NOTE — Patient Outreach (Signed)
  Care Coordination   Initial Visit Note   05/17/2022 Name: [name] Cook MRN: [id number] DOB: 08/10/1950  [name] Cook is a 72 y.o. year old male who sees [name], NP for primary care. I spoke with  [name] Cook by phone today.  What matters to the patients health and wellness today?  Patient states he does not have any nursing or community resource needs at this time.     Goals Addressed             This Visit's Progress    Care coordination activities - no follow up needed       Care coordination activities Care coordination program/ services discussed  Social determinants of health survey completed Patient advised to contact primary care provider office  if care coordination services needed in the future Discussed recent ED visits and verbally assessed current health status.          SDOH assessments and interventions completed:  Yes  SDOH Interventions Today    Flowsheet Row Most Recent Value  SDOH Interventions   Food Insecurity Interventions Intervention Not Indicated  Housing Interventions Intervention Not Indicated  Transportation Interventions Intervention Not Indicated        Care Coordination Interventions:  Yes, provided   Follow up plan: No further intervention required.   Encounter Outcome:  Pt. Visit Completed   Quinn Plowman RN,BSN,CCM [place] [phone] direct line

## 2022-05-22 ENCOUNTER — Telehealth: Payer: Self-pay

## 2022-05-26 ENCOUNTER — Ambulatory Visit (INDEPENDENT_AMBULATORY_CARE_PROVIDER_SITE_OTHER): Payer: 59 | Admitting: Nurse Practitioner

## 2022-05-26 ENCOUNTER — Telehealth: Payer: Self-pay | Admitting: Pharmacist

## 2022-05-26 ENCOUNTER — Encounter: Payer: Self-pay | Admitting: Nurse Practitioner

## 2022-05-26 VITALS — BP 114/68 | HR 80 | Temp 98.2°F | Ht 71.0 in | Wt 216.4 lb

## 2022-05-26 DIAGNOSIS — N390 Urinary tract infection, site not specified: Secondary | ICD-10-CM | POA: Diagnosis not present

## 2022-05-26 DIAGNOSIS — R319 Hematuria, unspecified: Secondary | ICD-10-CM

## 2022-05-26 HISTORY — DX: Urinary tract infection, site not specified: N39.0

## 2022-05-26 LAB — POC URINALSYSI DIPSTICK (AUTOMATED)
Bilirubin, UA: NEGATIVE
Blood, UA: NEGATIVE
Glucose, UA: POSITIVE — AB
Ketones, UA: POSITIVE
Nitrite, UA: POSITIVE
Protein, UA: NEGATIVE
Spec Grav, UA: 1.015 (ref 1.010–1.025)
Urobilinogen, UA: NEGATIVE E.U./dL — AB
pH, UA: 6 (ref 5.0–8.0)

## 2022-05-26 MED ORDER — SULFAMETHOXAZOLE-TRIMETHOPRIM 800-160 MG PO TABS: 1.0000 | ORAL_TABLET | Freq: Two times a day (BID) | ORAL | 0 refills | Status: DC

## 2022-05-26 NOTE — Telephone Encounter (Signed)
Noted and appreciate Ms. Starleen Arms evaluation.

## 2022-05-26 NOTE — Telephone Encounter (Signed)
During call with pharmacy assistant, the patient reports he noticed visibly red blood in his urine today. Routing to triage.

## 2022-05-26 NOTE — Telephone Encounter (Signed)
I spoke with pt; pt said starting this morning pt had blood in urine and after finish urinating would drip blood briefly; pt has no abd pain or back pain; no known injury; no burning or pain upon urination and no frequency of urine. Pt has no fever. Pt said urinating good amt and seems like little less blood now. Pt said when in hospital in [name] 2024 pt was advised had 2 small kidney stones. Pt said no hx of kidney stones previously and does not have urologist. Pt said about 2 yrs ago had blood and blood thinner was reduced. Pt is presently taking eliquis 5 mg bid. No available appts at [facility] and I spoke with Gentry Fitz NP as PCP and she said pt does need to be seen. Amy front office mgr found available appt 05/26/22 at 3:40 at [facility] with Louann Liv NP. Pt scheduled that appt and will go 15 mins early for check in with UC & ED precautions and pt voiced understanding. Sending note to Theresia Lo NP  and Gentry Fitz NP as PCP. Thank you.

## 2022-05-26 NOTE — Progress Notes (Signed)
Established Patient Office Visit  Subjective:  Patient ID: [name] Cook, male    DOB: [birth year]  Age: 72 y.o. MRN: [id number]  CC:  Chief Complaint  Patient presents with  . Acute Visit    Blood in urine      HPI  [name] Cook presents for:  Hematuria This is a new problem. The current episode started today. He describes the hematuria as gross hematuria. He is experiencing no pain. He describes his urine color as light pink. Irritative symptoms do not include frequency or urgency. Associated symptoms include dysuria. Pertinent negatives include no abdominal pain, chills or fever. He is not sexually active. His past medical history is significant for kidney stones. Risk factors include anticoagulant.     Past Medical History:  Diagnosis Date  . Acute bronchitis with COPD ([place]) 03/07/2021  . Arthritis    "knees and lower back" (03/14/2013)  . Atrial flutter ([place])    radiofrequency ablation in 2001  . CAD (coronary artery disease)    a. Nonobstructive. Cardiac cath in 2001-50% mid RI, normal LM, LAD, RCA b. cath 10/16/2014 95% mid RCA treated with DES, 99% ost D1 medical management due to small aneurysmal segment  . Chronic anticoagulation    chronic Coumadin anticoagulation  . Chronic obstructive pulmonary disease ([place]) 04/20/2011  . Diabetes mellitus, type 2 ([place])   . Elevated lipase 06/23/2021  . GERD (gastroesophageal reflux disease)   . Hyperlipidemia   . Hypertension    with hypertensive heart disease  . Left knee pain 10/25/2017   medial  . Obesity   . Persistent atrial fibrillation ([facility])    recurrent atrial flutter since 2001 s/p DCCVs, multiple failed AADs, h/o tachy-mediated cardiomyopathy  . Shortness of breath    "can come on at any time" (03/14/2013)  . Sleep apnea    "dx'd; couldn't wear the mask" (03/14/2013)  . Symptomatic anemia 03/08/2022  . Tobacco abuse     Past Surgical History:  Procedure Laterality Date  . ATRIAL FLUTTER ABLATION  2002    atrial flutter; subsequently developed atrial fibrillation  . AV NODE ABLATION  01/24/2013  . CARDIAC CATHETERIZATION  2002  . CARDIAC CATHETERIZATION N/A 10/16/2014   Procedure: Left Heart Cath and Coronary Angiography;  Surgeon: [name], MD;  Location: [place] [facility];  Service: Cardiovascular;  Laterality: N/A;  . CARDIOVERSION  05/31/2011   Procedure: CARDIOVERSION;  Surgeon: Cristopher Estimable. Lattie Haw, MD;  Location: [facility];  Service: Cardiovascular;  Laterality: N/A;  . CARPAL TUNNEL RELEASE Left 1980's  . COLONOSCOPY WITH PROPOFOL N/A 12/30/2018   Procedure: COLONOSCOPY WITH PROPOFOL;  Surgeon: Jonathon Bellows, MD;  Location: [facility] ENDOSCOPY;  Service: Gastroenterology;  Laterality: N/A;  . COLONOSCOPY WITH PROPOFOL N/A 12/04/2019   Procedure: COLONOSCOPY WITH PROPOFOL;  Surgeon: Jonathon Bellows, MD;  Location[facility]COPY;  Service: Gastroenterology;  Laterality: N/A;  . COLONOSCOPY WITH PROPOFOL N/A 06/13/2021   Procedure: COLONOSCOPY WITH PROPOFOL;  Surgeon: Jonathon Bellows, MD;  Location: [facility] ENDOSCOPY;  Service: Gastroenterology;  Laterality: N/A;  . ESOPHAGOGASTRODUODENOSCOPY N/A 03/01/2022   Procedure: ESOPHAGOGASTRODUODENOSCOPY (EGD);  Surgeon: Clarene Essex, MD;  Location: [facility];  Service: Gastroenterology;  Laterality: N/A;  . GIVENS CAPSULE STUDY Left 03/10/2022   Procedure: GIVENS CAPSULE STUDY;  Surgeon: Arta Silence, MD;  Location: [facility] ENDOSCOPY;  Service: Gastroenterology;  Laterality: Left;  . INSERT / REPLACE / REMOVE PACEMAKER  01/24/2013    Medtronic Adapta L dual-chamber pacemaker, serial number NWE [id number]   . LEFT HEART  CATH AND CORONARY ANGIOGRAPHY N/A 06/07/2018   Procedure: LEFT HEART CATH AND CORONARY ANGIOGRAPHY;  Surgeon: [name], MD;  Location: [place] [facility];  Service: Cardiovascular;  Laterality: N/A;  . LEFT HEART CATHETERIZATION WITH CORONARY ANGIOGRAM N/A 03/17/2013   Procedure: LEFT HEART CATHETERIZATION WITH CORONARY ANGIOGRAM;  Surgeon:  Burnell Blanks, MD; LAD mild dz, D1 branch 100%, inferior branch 99%, CFX OK, RCA 50%, EF 65%    . LOOP RECORDER IMPLANT  2002  . PERMANENT PACEMAKER INSERTION N/A 01/24/2013   Procedure: PERMANENT PACEMAKER INSERTION;  Surgeon: [name], MD;  Location[facility] C[facility] LAB;  Service: Cardiovascular;  Laterality: N/A;  . TIBIAL TUBERCLERPLASTY  ~ 2003    Family History  Problem Relation Age of Onset  . Alzheimer's disease Mother   . Osteoporosis Mother     Social History   Socioeconomic History  . Marital status: Widowed    Spouse name: Not on file  . Number of children: 1  . Years of education: Not on file  . Highest education level: Not on file  Occupational History  . Occupation: Merchandiser, retail: UNEMPLOYED  Tobacco Use  . Smoking status: Former    Packs/day: 1.00    Years: 42.00    Total pack years: 42.00    Types: Cigarettes    Quit date: 12/30/2013    Years since quitting: 8.4  . Smokeless tobacco: Never  . Tobacco comments:    Former smoker 03/20/22  Vaping Use  . Vaping Use: Never used  Substance and Sexual Activity  . Alcohol use: Not Currently    Comment: 03/14/2013 "stopped drinking back in 2002; never had problem w/it"  . Drug use: No  . Sexual activity: Not Currently  Other Topics Concern  . Not on file  Social History Narrative   Single.   Retired.    1 son, deceased.    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   Enjoys playing on the computer.       Social Determinants of Health   Financial Resource Strain: Low Risk  (10/07/2021)   Overall Financial Resource Strain (CARDIA)   . Difficulty of Paying Living Expenses: Not hard at all  Food Insecurity: No Food Insecurity (05/17/2022)   Hunger Vital Sign   . Worried About Charity fundraiser in the Last Year: Never true   . Ran Out of Food in the Last Year: Never true  Transportation Needs: No Transportation Needs (05/17/2022)   PRAPARE -  Transportation   . Lack of Transportation (Medical): No   . Lack of Transportation (Non-Medical): No  Physical Activity: Unknown (10/07/2021)   Exercise Vital Sign   . Days of Exercise per Week: 3 days   . Minutes of Exercise per Session: Not on file  Stress: No Stress Concern Present (10/07/2021)   [place]   . Feeling of Stress : Not at all  Social Connections: Socially Isolated (10/07/2021)   Social Connection and Isolation Panel [NHANES]   . Frequency of Communication with Friends and Family: Once a week   . Frequency of Social Gatherings with Friends and Family: Once a week   . Attends Religious Services: Never   . Active Member of Clubs or Organizations: No   . Attends Archivist Meetings: Never   . Marital Status: Widowed  Intimate Partner Violence: Not At Risk (03/09/2022)   Humiliation, Afraid, Rape, and Kick  questionnaire   . Fear of Current or Ex-Partner: No   . Emotionally Abused: No   . Physically Abused: No   . Sexually Abused: No     Outpatient Medications Prior to Visit  Medication Sig Dispense Refill  . albuterol (VENTOLIN HFA) 108 (90 Base) MCG/ACT inhaler INHALE 2 PUFFS BY MOUTH EVERY 4 HOURS AS NEEDED FOR WHEEZE OR FOR SHORTNESS OF BREATH 8.5 each 0  . apixaban (ELIQUIS) 5 MG TABS tablet Take 1 tablet (5 mg total) by mouth 2 (two) times daily. 60 tablet 1  . Ascorbic Acid (VITAMIN C) 1000 MG tablet Take 2,000 mg by mouth daily.    . blood glucose meter kit and supplies KIT Dispense based on patient and insurance preference. Use up to four times daily as directed. (FOR ICD-9 250.00, 250.01). 1 each 0  . budesonide-formoterol (SYMBICORT) 160-4.5 MCG/ACT inhaler INHALE 2 PUFFS INTO THE LUNGS TWICE A DAY (Patient taking differently: Inhale 2 puffs into the lungs daily as needed (for shortness of breath and wheezing).) 10.2 each 5  . fluticasone (FLONASE) 50 MCG/ACT nasal spray PLACE 1 SPRAY  INTO BOTH NOSTRILS DAILY AS NEEDED FOR ALLERGIES OR RHINITIS. 48 mL 3  . gabapentin (NEURONTIN) 300 MG capsule TAKE 1 CAPSULE BY MOUTH EVERY MORNING AND 2 CAPSULES BY MOUTH AT NIGHT FOR NEUROPATHY 270 capsule 3  . glipiZIDE (GLUCOTROL XL) 10 MG 24 hr tablet TAKE 1 TABLET (10 MG TOTAL) BY MOUTH DAILY WITH BREAKFAST. FOR DIABETES. 90 tablet 1  . glucose blood test strip 1 each by Other route as needed for other. Use to check blood sugar up to three times a day-Accu-Chek meter    . isosorbide mononitrate (IMDUR) 30 MG 24 hr tablet Take 1 tablet (30 mg total) by mouth daily. 90 tablet 1  . ivabradine (CORLANOR) 5 MG TABS tablet Take 2 tablets (72m ) by mouth two hours prior to scan 2 tablet 0  . JARDIANCE 25 MG TABS tablet TAKE 1 TABLET (25 MG TOTAL) BY MOUTH DAILY BEFORE BREAKFAST. FOR DIABETES. 90 tablet 1  . lisinopril (ZESTRIL) 2.5 MG tablet TAKE 1 TABLET BY MOUTH EVERY DAY 90 tablet 3  . nitroGLYCERIN (NITROSTAT) 0.4 MG SL tablet Place 1 tablet (0.4 mg total) under the tongue every 5 (five) minutes as needed for chest pain. Do not exceed 3 tablets in 24 hours. 25 tablet 0  . pantoprazole (PROTONIX) 40 MG tablet Take 1 tablet (40 mg total) by mouth daily. 30 tablet 2  . propranolol ER (INDERAL LA) 80 MG 24 hr capsule Take 1 capsule (80 mg total) by mouth at bedtime. For tremors 90 capsule 0  . rosuvastatin (CRESTOR) 20 MG tablet TAKE 1 TABLET BY MOUTH EVERY DAY IN THE EVENING FOR CHOLESTEROL 90 tablet 3  . sertraline (ZOLOFT) 50 MG tablet TAKE 1 TABLET (50 MG TOTAL) BY MOUTH DAILY. FOR ANXIETY AND DEPRESSION. 90 tablet 2  . tamsulosin (FLOMAX) 0.4 MG CAPS capsule TAKE 1 CAPSULE (0.4 MG TOTAL) BY MOUTH 2 (TWO) TIMES DAILY. 180 capsule 3  . TYLENOL 500 MG tablet Take 500-1,000 mg by mouth every 6 (six) hours as needed for mild pain or headache.     No facility-administered medications prior to visit.    Allergies  Allergen Reactions  . Januvia [Sitagliptin] Other (See Comments)    Elevated  lipase  . Metformin And Related Diarrhea    ROS Review of Systems  Constitutional:  Negative for chills and fever.  Gastrointestinal:  Negative for abdominal  pain.  Genitourinary:  Positive for dysuria and hematuria. Negative for frequency and urgency.      Objective:    Physical Exam  BP 114/68   Pulse 80   Temp 98.2 F (36.8 C)   Ht 5' 11"$  (1.803 m)   Wt 216 lb 6.4 oz (98.2 kg)   SpO2 98%   BMI 30.18 kg/m  Wt Readings from Last 3 Encounters:  05/26/22 216 lb 6.4 oz (98.2 kg)  05/01/22 213 lb (96.6 kg)  04/28/22 209 lb (94.8 kg)     Health Maintenance  Topic Date Due  . Pneumonia Vaccine 70+ Years old (96 of 3 - PPSV23 or PCV20) 03/23/2023 (Originally 09/22/2018)  . Medicare Annual Wellness (AWV)  10/08/2022  . HEMOGLOBIN A1C  10/27/2022  . OPHTHALMOLOGY EXAM  03/31/2023  . Diabetic kidney evaluation - eGFR measurement  04/29/2023  . FOOT EXAM  04/29/2023  . Diabetic kidney evaluation - Urine ACR  05/02/2023  . Lung Cancer Screening  05/09/2023  . COLONOSCOPY (Pts 45-50yr Insurance coverage will need to be confirmed)  06/13/2024  . INFLUENZA VACCINE  Completed  . Hepatitis C Screening  Completed  . HPV VACCINES  Aged Out  . DTaP/Tdap/Td  Discontinued  . COVID-19 Vaccine  Discontinued  . Zoster Vaccines- Shingrix  Discontinued    There are no preventive care reminders to display for this patient.  Lab Results  Component Value Date   TSH 1.17 04/28/2022   Lab Results  Component Value Date   WBC 4.6 04/24/2022   HGB 10.0 (L) 04/24/2022   HCT 32.4 (L) 04/24/2022   MCV 89.8 04/24/2022   PLT 172 04/24/2022   Lab Results  Component Value Date   NA 141 04/28/2022   K 3.9 04/28/2022   CO2 27 04/28/2022   GLUCOSE 141 (H) 04/28/2022   BUN 20 04/28/2022   CREATININE 1.26 04/28/2022   BILITOT 0.3 04/07/2022   ALKPHOS 42 04/07/2022   AST 17 04/07/2022   ALT 10 04/07/2022   PROT 5.1 (L) 04/07/2022   ALBUMIN 3.5 04/07/2022   CALCIUM 9.0 04/28/2022    ANIONGAP 7 04/24/2022   GFR 58.02 (L) 04/28/2022   Lab Results  Component Value Date   CHOL 132 10/26/2021   Lab Results  Component Value Date   HDL 50.10 10/26/2021   Lab Results  Component Value Date   LDLCALC 49 10/26/2021   Lab Results  Component Value Date   TRIG 166.0 (H) 10/26/2021   Lab Results  Component Value Date   CHOLHDL 3 10/26/2021   Lab Results  Component Value Date   HGBA1C 5.8 (A) 04/28/2022      Assessment & Plan:   Problem List Items Addressed This Visit   None Visit Diagnoses     Hematuria, unspecified type    -  Primary   Relevant Orders   POCT Urinalysis Dipstick (Automated)        No orders of the defined types were placed in this encounter.    Follow-up: No follow-ups on file.    CTheresia Lo NP

## 2022-05-26 NOTE — Patient Instructions (Addendum)
The urine shows infection and started you on Bactrim. No blood in the urine. Will send urine for culture and  will call you with the results. Increase hydration. Please follow-up with your PCP if you episode of blood in urine.

## 2022-05-27 LAB — URINALYSIS, MICROSCOPIC ONLY
Bacteria, UA: NONE SEEN
Casts: NONE SEEN /lpf
Epithelial Cells (non renal): NONE SEEN /hpf (ref 0–10)

## 2022-05-29 ENCOUNTER — Encounter: Payer: Self-pay | Admitting: Nurse Practitioner

## 2022-05-29 LAB — URINE CULTURE

## 2022-05-29 NOTE — Assessment & Plan Note (Addendum)
POCT urinalysis to check for leukocyte, nitrate and glucose and negative for blood. Will start him on antibiotic Bactrim twice a day for 7 days. Will send urine for culture and microscopy. Please follow-up with PCP if blood in urine continued.

## 2022-06-01 NOTE — Telephone Encounter (Signed)
Called the patient again today but got no answer.  I left another message on his voicemail.  I have reviewed his CTA study and I think he has suitable anatomy for watchman flx implantation if he desires to proceed.  I would be happy to discuss this with him if he would like to have further discussion before scheduling the procedure.

## 2022-06-01 NOTE — Telephone Encounter (Signed)
Attempted to call patient again.  Left message to call back.  When the patient returns call, will offer procedure date of 06/22/2022 or 4/25. Will arrange 30 day visit as well.

## 2022-06-02 ENCOUNTER — Telehealth: Payer: Self-pay

## 2022-06-02 NOTE — Progress Notes (Signed)
Contacted the patient for BP log.  BP's from this past week .  116/66   80-P  108/65  94-P  110/68  96-P  Charlene Brooke, PharmD notified  Avel Sensor, [place] Assistant [phone]

## 2022-06-02 NOTE — Telephone Encounter (Signed)
Pt returned call and he would like to proceed with Watchman on 06/22/2022.  I have arranged pre procedure office visit on 06/16/2022 and mailed appointment information to the patient per his request.

## 2022-06-05 ENCOUNTER — Other Ambulatory Visit: Payer: Self-pay

## 2022-06-05 ENCOUNTER — Telehealth: Payer: Self-pay

## 2022-06-05 DIAGNOSIS — I4821 Permanent atrial fibrillation: Secondary | ICD-10-CM

## 2022-06-05 NOTE — Progress Notes (Unsigned)
Care Management & Coordination Services Pharmacy Team  Reason for Encounter: Appointment Reminder  Contacted patient to confirm telephone appointment with Charlene Brooke , PharmD on 06/08/22 at 11:45. {US HC Outreach:[id number]}  Do you have any problems getting your medications? No  What is your top health concern you would like to discuss at your upcoming visit? Hematuria resolved  Have you seen any other providers since your last visit with PCP? No  family medicine for hematuria    Star Rating Drugs:  Medication:  Last Fill: Day Supply Glipizide 29m 03/07/22 90 Jardiance 230m12/3/23 90 Lisinopril 2.76m76m/15/24 90 Rosuvastatin 98m52m/18/23 90   Care Gaps: Annual wellness visit in last year? Yes  If Diabetic: Last eye exam / retinopathy screening: UTD Last diabetic foot exam: overdue  LindCharlene BrookearmD notified  VelmAvel SensorMAMarshalltownistant [phone]

## 2022-06-05 NOTE — Telephone Encounter (Signed)
Case booked 3/7.  Instruction letter created for patient pick-up at pre-procedure visit.

## 2022-06-06 ENCOUNTER — Other Ambulatory Visit: Payer: Self-pay | Admitting: Primary Care

## 2022-06-06 ENCOUNTER — Other Ambulatory Visit: Payer: Self-pay | Admitting: Internal Medicine

## 2022-06-06 DIAGNOSIS — E1165 Type 2 diabetes mellitus with hyperglycemia: Secondary | ICD-10-CM

## 2022-06-08 ENCOUNTER — Ambulatory Visit: Payer: 59 | Admitting: Pharmacist

## 2022-06-08 ENCOUNTER — Telehealth: Payer: Self-pay | Admitting: Pharmacist

## 2022-06-08 DIAGNOSIS — I2699 Other pulmonary embolism without acute cor pulmonale: Secondary | ICD-10-CM

## 2022-06-08 DIAGNOSIS — I4821 Permanent atrial fibrillation: Secondary | ICD-10-CM

## 2022-06-08 MED ORDER — APIXABAN 5 MG PO TABS: 5.0000 mg | ORAL_TABLET | Freq: Two times a day (BID) | ORAL | 0 refills | Status: DC

## 2022-06-08 NOTE — Progress Notes (Signed)
Care Management & Coordination Services Pharmacy Note  06/08/2022 Name:  [name] Cook MRN:  [id number] DOB:  Jul 01, 1950  Summary: -HTN: pt reports improvement with hypotensive issues - he is not checking BP at home but reports dizziness has improved -Afib: pt has Watchman procedure planned for 06/22/22; he reports compliance with Eliquis; recent hematuria has resolved, no further s/sx of bleeding per pt -Tremor: pt reports significant improvement with propranolol -DM: A1c 5.8% (04/28/22) at goal, though query accuracy of A1c in setting of recent blood loss and transfusion (1 u PRBC 04/07/22, 4 u PRBC 03/13/22); pt reports fasting BG 136 today, he denies BG < 100   Recommendations/Changes made from today's visit: -Advised pt to continue monitoring BP; contact provider with BP < 100/60 or < 110/60 with symptoms -Delay next A1c until 3 months after last blood transfusion (~07/07/22) to ensure accuracy  Follow up plan: -Pharmacist follow up televisit scheduled for 1 month -Urology appt 06/21/22, Cardiology appt 06/16/22; PCP appt 10/27/22 (CPE)     Subjective: [name] Cook is an 72 y.o. year old male who is a primary patient of [name], NP.  The care coordination team was consulted for assistance with disease management and care coordination needs.    Engaged with patient by telephone for follow up visit.  Recent office visits: 05/02/22 TE - ok to start propranolol per cardiology. Rx propranolol ER 80 mg HS.  04/28/22 NP Allie Bossier OV: f/u - tremor. Consider propranolol, check with cardiology first.  03/22/22 NP Allie Bossier OV: hospital f/u  - start pantoprazole.   08/19/21 NP Matt Charmian Muff OV: acute cough - rx'd cetirizine, benzonate. Covid negaive. 07/28/21 NP Alma Friendly OV: f/u DM, depression. Increased duloxetine to 90 mg/day. Increase gabapentin to 2 cap at night. Work on diet changes.   06/23/21 NP Alma Friendly OV: f/u DM. Increase Jardiance to 25 mg daily. F/u with GI regarding  diarrhea. Lipase elevated, ordered CT scan - negative for pancreatitis.   06/08/21 Phone note - BG has elevated to 212. Started glipizide 10 mg. 05/27/21 NP Alma Friendly OV: c/o diarrhea. Stop metformin. Complete stool sample kit. Referred for colonoscopy. Lipase elevated, concern for pancreatitis, advised to stop Januvia as well.  Recent consult visits: 05/26/22 NP [facility]): Hematuria, UTI - rx Bactrim.  05/01/22 Dr Burt Knack (Cardiology): Afib - requires 6 months AC from PE 02/2022. Continue Eliquis. Start ivabradine 10 mg BID  03/20/22 PA Clint Fenton (Afib clinic): refer to [facility] team. Continue Eliquis.   03/15/22 PA Ermalinda Barrios (Cardiology): f/u - d/c clopdiogrel (now on Eliquis, recent GIB). Weak/dizzy - d/c lasix and reduce imdur to 30 mg  Hospital visits: 04/24/21 ED visit [facility]): SOB - HGB 10. F/u PCP.  04/07/22 ED visit [facility]): weakness, anemia. HGB 7.4. Given 1 u PRBC.   03/28/22 ED visit [facility]): dizzines, SOB. Orthostatic in ED (120/70 to 90/60). Given IV fluids. Hold Imdur. F/u PCP.  03/08/22 - 03/12/22 Admission ([facility]): GI Bleed, Acute PE/DVT. Transition to Eliquis. Hold Plavix.   02/27/22 - 03/03/22 Admission ([facility]): Upper GI bleed, on warfarin and Plavix. HGB 9.7 on admission down from 15. Received 4 units PRBC. Changed to Pantoprazole 40 mg BID. Resumed Plavix but hold Coumadin until clinic f/u. HGB 8.1 at discharge.   Objective:  Lab Results  Component Value Date   CREATININE 1.26 04/28/2022   BUN 20 04/28/2022   GFR 58.02 (L) 04/28/2022   GFRNONAA 52 (L) 04/24/2022   GFRAA 72 06/23/2019   NA 141  04/28/2022   K 3.9 04/28/2022   CALCIUM 9.0 04/28/2022   CO2 27 04/28/2022   GLUCOSE 141 (H) 04/28/2022    Lab Results  Component Value Date/Time   HGBA1C 5.8 (A) 04/28/2022 11:06 AM   HGBA1C 6.4 (H) 03/08/2022 06:00 PM   HGBA1C 7.5 (H) 10/26/2021 12:43 PM   HGBA1C 6.4 (H) 11/27/2016 11:33 AM   HGBA1C 7.1 (H) 08/21/2016 10:25 AM   GFR 58.02 (L) 04/28/2022 11:31 AM    GFR 71.64 10/26/2021 12:43 PM   MICROALBUR 1.1 05/01/2022 12:45 PM   MICROALBUR <0.7 09/21/2017 01:06 PM    Last diabetic Eye exam:  Lab Results  Component Value Date/Time   HMDIABEYEEXA No Retinopathy 03/30/2022 12:00 AM    Last diabetic Foot exam: No results found for: "HMDIABFOOTEX"   Lab Results  Component Value Date   CHOL 132 10/26/2021   HDL 50.10 10/26/2021   LDLCALC 49 10/26/2021   TRIG 166.0 (H) 10/26/2021   CHOLHDL 3 10/26/2021       Latest Ref Rng & Units 04/07/2022    8:14 PM 03/28/2022    2:33 PM 03/09/2022    1:03 AM  Hepatic Function  Total Protein 6.5 - 8.1 g/dL 5.1  6.0  5.4   Albumin 3.5 - 5.0 g/dL 3.5  3.6  3.3   AST 15 - 41 U/L 17  17  13   $ ALT 0 - 44 U/L 10  14  16   $ Alk Phosphatase 38 - 126 U/L 42  42  40   Total Bilirubin 0.3 - 1.2 mg/dL 0.3  0.5  0.6   Bilirubin, Direct 0.0 - 0.2 mg/dL  <0.1      Lab Results  Component Value Date/Time   TSH 1.17 04/28/2022 11:31 AM   TSH 2.030 04/15/2019 10:05 AM   FREET4 0.88 04/28/2022 11:31 AM   FREET4 1.38 03/14/2013 11:12 AM       Latest Ref Rng & Units 04/24/2022    9:58 PM 04/07/2022    8:14 PM 03/28/2022    2:33 PM  CBC  WBC 4.0 - 10.5 K/uL 4.6  3.9  4.1   Hemoglobin 13.0 - 17.0 g/dL 10.0  7.4  8.5   Hematocrit 39.0 - 52.0 % 32.4  26.0  27.5   Platelets 150 - 400 K/uL 172  146  163     Lab Results  Component Value Date/Time   VD25OH 32 10/27/2015 10:54 AM    Clinical ASCVD: Yes  The ASCVD Risk score (Arnett DK, et al., 2019) failed to calculate for the following reasons:   The patient has a prior MI or stroke diagnosis    CHA2DS2/VAS Stroke Risk Points  Current as of yesterday     5 >= 2 Points: High Risk  1 - 1.99 Points: Medium Risk  0 Points: Low Risk    Last Change: N/A        05/26/2022    3:52 PM 04/28/2022   10:47 AM 03/22/2022   10:27 AM  Depression screen PHQ 2/9  Decreased Interest 0 0 0  Down, Depressed, Hopeless 0 0 0  PHQ - 2 Score 0 0 0  Altered sleeping 0  0   Tired, decreased energy 0  2  Change in appetite 0  0  Feeling bad or failure about yourself  0  0  Trouble concentrating 0  0  Moving slowly or fidgety/restless 0  0  Suicidal thoughts 0  0  PHQ-9 Score 0  2  Difficult doing work/chores Not difficult at all  Not difficult at all     Social History   Tobacco Use  Smoking Status Former   Packs/day: 1.00   Years: 42.00   Total pack years: 42.00   Types: Cigarettes   Quit date: 12/30/2013   Years since quitting: 8.4  Smokeless Tobacco Never  Tobacco Comments   Former smoker 03/20/22   BP Readings from Last 3 Encounters:  05/26/22 114/68  05/12/22 113/76  05/01/22 (!) 102/58   Pulse Readings from Last 3 Encounters:  05/26/22 80  05/01/22 88  04/28/22 91   Wt Readings from Last 3 Encounters:  05/26/22 216 lb 6.4 oz (98.2 kg)  05/01/22 213 lb (96.6 kg)  04/28/22 209 lb (94.8 kg)   BMI Readings from Last 3 Encounters:  05/26/22 30.18 kg/m  05/01/22 29.71 kg/m  04/28/22 29.15 kg/m    Allergies  Allergen Reactions   Januvia [Sitagliptin] Other (See Comments)    Elevated lipase   Metformin And Related Diarrhea    Medications Reviewed Today     Reviewed by Theresia Lo, NP (Nurse Practitioner) on 05/29/22 at [place] List Status: <None>   Medication Order Taking? Sig Documenting Provider Last Dose Status Informant  albuterol (VENTOLIN HFA) 108 (90 Base) MCG/ACT inhaler [id number] Yes INHALE 2 PUFFS BY MOUTH EVERY 4 HOURS AS NEEDED FOR WHEEZE OR FOR SHORTNESS OF BREATH [name], NP Taking Active   apixaban (ELIQUIS) 5 MG TABS tablet [id number] Yes Take 1 tablet (5 mg total) by mouth 2 (two) times daily. Geradine Girt, DO Taking Active Self  Ascorbic Acid (VITAMIN C) 1000 MG tablet [id number] Yes Take 2,000 mg by mouth daily. [provider] Taking Active Self  blood glucose meter kit and supplies KIT [id number] Yes Dispense based on patient and insurance preference. Use up to four times daily  as directed. (FOR ICD-9 250.00, 250.01). [name], NP Taking Active Self  budesonide-formoterol [facility]) 160-4.5 MCG/ACT inhaler [id number] Yes INHALE 2 PUFFS INTO THE LUNGS TWICE A DAY  Patient taking differently: Inhale 2 puffs into the lungs daily as needed (for shortness of breath and wheezing).   [name], NP Taking Active Self  fluticasone (FLONASE) 50 MCG/ACT nasal spray [id number] Yes PLACE 1 SPRAY INTO BOTH NOSTRILS DAILY AS NEEDED FOR ALLERGIES OR RHINITIS. [name], NP Taking Active Self  gabapentin (NEURONTIN) 300 MG capsule [id number] Yes TAKE 1 CAPSULE BY MOUTH EVERY MORNING AND 2 CAPSULES BY MOUTH AT NIGHT FOR NEUROPATHY [name], NP Taking Active Self  glipiZIDE (GLUCOTROL XL) 10 MG 24 hr tablet [id number] Yes TAKE 1 TABLET (10 MG TOTAL) BY MOUTH DAILY WITH BREAKFAST. FOR DIABETES. [name], NP Taking Active Self  glucose blood test strip [id number] Yes 1 each by Other route as needed for other. Use to check blood sugar up to three times a day-Accu-Chek meter [provider] Taking Active Self  isosorbide mononitrate (IMDUR) 30 MG 24 hr tablet [id number] Yes Take 1 tablet (30 mg total) by mouth daily. Imogene Burn, PA-C Taking Active   ivabradine (CORLANOR) 5 MG TABS tablet [id number] Yes Take 2 tablets (55m ) by mouth two hours prior to scan CSherren Mocha MD Taking Active   JARDIANCE 25 MG TABS tablet [id number] TAKE 1 TABLET (25 MG TOTAL) BY MOUTH DAILY BEFORE BREAKFAST. FOR DIABETES. CPleas Koch NP Taking Active Self  lisinopril (ZESTRIL) 2.5 MG tablet [id number] TAKE 1 TABLET BY MOUTH  EVERY DAY [name], MD Taking Active Self  nitroGLYCERIN (NITROSTAT) 0.4 MG SL tablet [id number] Yes Place 1 tablet (0.4 mg total) under the tongue every 5 (five) minutes as needed for chest pain. Do not exceed 3 tablets in 24 hours. [name], NP Taking Active   pantoprazole (PROTONIX) 40 MG tablet [id number] Yes  Take 1 tablet (40 mg total) by mouth daily. Geradine Girt, DO Taking Active Self  propranolol ER (INDERAL LA) 80 MG 24 hr capsule [id number] Yes Take 1 capsule (80 mg total) by mouth at bedtime. For tremors [name], NP Taking Active   rosuvastatin (CRESTOR) 20 MG tablet [id number] Yes TAKE 1 TABLET BY MOUTH EVERY DAY IN THE EVENING FOR CHOLESTEROL [name], NP Taking Active Self  sertraline (ZOLOFT) 50 MG tablet [id number] Yes TAKE 1 TABLET (50 MG TOTAL) BY MOUTH DAILY. FOR ANXIETY AND DEPRESSION. [name], NP Taking Active Self  sulfamethoxazole-trimethoprim (BACTRIM DS) 800-160 MG tablet [id number] Yes Take 1 tablet by mouth 2 (two) times daily. Theresia Lo, NP  Active   tamsulosin (FLOMAX) 0.4 MG CAPS capsule [id number] Yes TAKE 1 CAPSULE (0.4 MG TOTAL) BY MOUTH 2 (TWO) TIMES DAILY. Noreene Filbert, MD Taking Active Self  TYLENOL 500 MG tablet [id number] Yes Take 500-1,000 mg by mouth every 6 (six) hours as needed for mild pain or headache. [provider] Taking Active Self            SDOH:  (Social Determinants of Health) assessments and interventions performed: No SDOH Interventions    Flowsheet Row Telephone from 05/17/2022 in [place] Telephone from 01/11/2022 in [place] Coordination Clinical Support from 10/07/2021 in [place] at [facility] Visit from 04/08/2021 in [place] at [place] Management from 12/07/2020 in [place] at [place] from 10/06/2020 in [place] at [place] Interventions Intervention Not Indicated Intervention Not Indicated Intervention Not Indicated -- -- --  Housing Interventions Intervention Not Indicated Intervention Not Indicated Intervention Not Indicated -- -- --   Transportation Interventions Intervention Not Indicated Intervention Not Indicated Intervention Not Indicated -- -- --  Depression Interventions/Treatment  -- -- -- Counseling -- PHQ2-9 Score <4 Follow-up Not Indicated  Financial Strain Interventions -- -- Intervention Not Indicated -- Intervention Not Indicated --  Physical Activity Interventions -- -- Intervention Not Indicated -- -- --  Stress Interventions -- -- Intervention Not Indicated -- -- --  Social Connections Interventions -- -- Intervention Not Indicated -- -- --       Medication Assistance: None required.  Patient affirms current coverage meets needs. [facility] Dual complete)  Medication Access: Within the past 30 days, how often has patient missed a dose of medication? 0 Is a pillbox or other method used to improve adherence? Yes  Factors that may affect medication adherence? no barriers identified Are meds synced by current pharmacy? No  Are meds delivered by current pharmacy? No  Does patient experience delays in picking up medications due to transportation concerns? No   [facility] Services Reviewed: Is patient disadvantaged to use [facility]?: No  Current Rx insurance plan: U[employer] dual complete Name and location of [facility]  [id number] [place] [id number]: [phone]: [address]  [place] [address] Phone: [phone] Fax: [phone]  [facility] services reviewed with patient today?: No  Patient requests to transfer care to [facility] Pharmacy?: No  Reason patient declined to change pharmacies: Not mentioned at this visit  Compliance/Adherence/Medication fill history: Care Gaps: None  Star-Rating Drugs: Glipizide - PDC 100% Jardiance - PDC 99% Lisinopril - PDC 100% Rosuvastatin - PDC 100%   ASSESSMENT / PLAN  Hypertension / LVH (BP goal  <130/80) -Controlled - pt is not checking BP at home; previously had many issues with hypotension/anemia leading to ED visits, but pt reports he feels much more stable now; he reports significant improvement in tremor with propranolol -LVEF 50% (02/2022); mild Left ventricular hypertrophy -Hx OSA, unable to tolerate CPAP -Current treatment: Lisinopril 2.5 mg daily -Appropriate, Effective, Safe, Accessible Isosorbide MN 30 mg daily - Appropriate, Effective, Query Safe Propranolol ER 80 mg daily HS (tremor) -Appropriate, Effective, Safe, Accessible Jardiance 25 mg daily - Appropriate, Effective, Safe, Accessible -Medications previously tried: furosemide -Educated on BP goals and benefits of medications for prevention of heart attack, stroke and kidney damage; -Counseled to monitor BP at home daily; contact provider with BP <100/60 or < 110/60 with symptoms -Recommended to continue current medication   Atrial Fibrillation (Goal: prevent stroke and major bleeding) -Controlled - per patient report no symptoms, INR stable -Followed by cardiology; pursuing Watchman procedure March 2024 -CHADSVASC: 5 -Current treatment: Eliquis 5 mg BID - Appropriate, Effective, Safe, Accessible Propranolol ER 80 mg daily HS (tremor) -Appropriate, Effective, Query Safe -Medications previously tried: metoprolol; warfarin (GI bleed x 2) -Counseled on importance of adherence to anticoagulant exactly as prescribed; avoidance of NSAIDs due to increased bleeding risk with anticoagulants; -Discussed propranolol, as above -Recommended to continue current medication  Acute PE/DVT (Goal: prevent recurrence) -Stable -GI bleed 02/27/22 on warfarin/Plavix > held warfarin. Readmitted 11/22 with GI bleed and PE > start Eliquis, hold Plavix. -Current treatment  Eliquis 5 mg BID -Appropriate, Effective, Safe, Accessible -Medications previously tried: warfarin -Discussed Eliquis cost - he reports copay was $0; he has  Medicare/Medicaid so cost should not be an issue going forward -Recommended to continue current medication  Diabetes (A1c goal <7%) -Query controlled - A1c 5.8% (04/28/22) at goal, though query accuracy of A1c in setting of recent blood loss and transfusion (1 u PRBC 04/07/22, 4 u PRBC 03/13/22) -Current home glucose readings - checks fasting 3 days per week fasting glucose: 136; nothing < 100 -Current medications: Jardiance 25 mg daily - Appropriate, Effective, Safe, Accessible Glipizide XL 10 mg daily - Appropriate, Effective, Safe, Accessible -Medications previously tried: metformin (diarrhea), Januvia (elevated lipase) -Reviewed benefits of medications and fasting BG goal < 130 -Discussed A1c is unreliable following blood transfusion, recommend to wait 3 months after last transfusion to re-check A1c for best results -Recommend to continue current medication  Hyperlipidemia/CAD: (LDL goal < 70) -Controlled - LDL 49 (10/2021) at goal; TRIG 166 (10/2021) slightly elevated -Hx CAD, stent Feb 2020, followed by cardiology -Pt denies any chest pain or abnormal symptoms. Affirms daily adherence.  -Current treatment: Rosuvastatin 20 mg daily - Appropriate, Effective, Safe, Accessible Isosorbide MN 30 mg daily -.Appropriate, Effective, Query Safe Nitroglycerin - PRN  -Appropriate, Effective, Safe, Accessible -Medications previously tried: fish oil -Educated on Cholesterol goals;  -Recommend to continue current medication  COPD/OSA (Goal: control symptoms and prevent exacerbations) -Controlled -  per patient report, no issues with breathing/wheezing -No active tobacco use. Quit 2013. Low dose CT 05/06/21 normal.  -  No PFTs found. No CAT score documented. -Current treatment  Symbicort 160-4.5 mcg/act - 2 puff BID (takes PRN) - Appropriate, Effective, Safe, Accessible Albuterol PRN - Appropriate, Effective, Safe, Accessible -Medications previously tried: none reported -Exacerbations requiring  treatment in last 6 months: none -Patient denies consistent use of maintenance inhaler. He uses Symbicort PRN -Frequency of rescue inhaler use: ~ 1-2 x/week -Counseled on Differences between maintenance and rescue inhalers -Recommended to continue current medication for now.  Depression/Insomnia (Goal: Improve mood, sleep) -Controlled - pt reports improvement in mood since duloxetine was increased -Caffeine: 2 cups coffee, drinks coke zero - 2 cans/day. No alcohol or tobacco use -PHQ9= 2 (03/2021) - no/minimal depression -Current treatment: Duloxetine 60 mg daily - Appropriate, Effective, Safe, Accessible Duloxetine 30 mg daily -Appropriate, Effective, Safe, Accessible -Medications previously tried/failed: Lexapro 10 mg, Bupropion 150 mg BID, citalopram -Recommended to continue current medication  Health Maintenance -Vaccine gaps: Prevnar20 or PPSV23, TDAP -Hx prostate cancer, s/p radiation and Lupron early 2020   Charlene Brooke, PharmD, BCACP Clinical Pharmacist [facility] at [facility] [phone]

## 2022-06-08 NOTE — Telephone Encounter (Signed)
Patient was originally on warfarin for atrial fibrillation.  Will continue Eliquis 5 mg twice daily for history of PE/DVT and for permanent atrial fibrillation.  It looks like he may undergo Watchman procedure in early March 2024.

## 2022-06-08 NOTE — Telephone Encounter (Signed)
Patient reports he needs refills for Eliquis 5 mg BID. This was initially prescribed by hospitalist in November for acute PE, which he will need at least 6 months of treatment.  [id number] [place] [id number]: [phone]: [phone] Routing to PCP

## 2022-06-08 NOTE — Patient Instructions (Signed)
Visit Information  Phone number for Pharmacist: [phone]  Thank you for meeting with me to discuss your medications! Below is a summary of what we talked about during the visit:   Recommendations/Changes made from today's visit: -Advised pt to continue monitoring BP; contact provider with BP < 100/60 or < 110/60 with symptoms -Delay next A1c until 3 months after last blood transfusion (~07/07/22) to ensure accuracy  Follow up plan: -Pharmacist follow up televisit scheduled for 1 month -Urology appt 06/21/22, Cardiology appt 06/16/22; PCP appt 10/27/22 (CPE)   Charlene Brooke, PharmD, [facility] Clinical Pharmacist [facility] [phone]

## 2022-06-14 NOTE — H&P (View-Only) (Signed)
[facility]                                     Cardiology Office Note:    Date:  06/16/2022   ID:  [name] TEEPLES, DOB April 21, 1950, MRN [id number]  PCP:  [name], NP  [facility] Cardiologist:  Candee Furbish, MD  [facility] Electrophysiologist:  [name], MD   Referring MD: [name], NP   Chief Complaint  Patient presents with   Follow-up    Pre LAAO    History of Present Illness:    [name] Cook is a 72 y.o. male with a hx of CHB s/p PPM, CAD, DM2, HLD, HTN, OSA, tobacco use, GI bleeding on AC and atrial flutter/ atrial fibrillation who was referred to Dr. Burt Knack for [place] closure with Watchman.   [name] Cook was initially diagnosed with atrial fibrillation in 2012 and failed several AAD and DCCVs. He had an AV node ablation in 2014. He was on Plavix and warfarin but was admitted 02/27/22 with an acute GI bleed that required transfusion. He has required a total of 5 PRBC infusions. Warfarin was held at that time. He was readmitted 03/08/22 with an acute PE and was restarted on Eliquis. He has tolerated this well however due to his hx of bleeding on [facility], he was interested in pursuing long term stroke prevention with Watchman.  Today he is here alone and reports that he has been doing well on low dose Eliquis with no bleeding in stool or urine. He denies chest pain, SOB, orthopnea, LE edema, palpitations, dizziness, presyncope, or syncope.   Past Medical History:  Diagnosis Date   Acute bronchitis with COPD ([place]) 03/07/2021   Arthritis    "knees and lower back" (03/14/2013)   Atrial flutter ([place])    radiofrequency ablation in 2001   CAD (coronary artery disease)    a. Nonobstructive. Cardiac cath in 2001-50% mid RI, normal LM, LAD, RCA b. cath 10/16/2014 95% mid RCA treated with DES, 99% ost D1 medical management due to small aneurysmal segment   Chronic anticoagulation    chronic Coumadin anticoagulation   Chronic obstructive pulmonary disease  ([place]) 04/20/2011   Diabetes mellitus, type 2 (HCC)    Elevated lipase 06/23/2021   GERD (gastroesophageal reflux disease)    Hyperlipidemia    Hypertension    with hypertensive heart disease   Left knee pain 10/25/2017   medial   Obesity    Persistent atrial fibrillation ([place])    recurrent atrial flutter since 2001 s/p DCCVs, multiple failed AADs, h/o tachy-mediated cardiomyopathy   Shortness of breath    "can come on at any time" (03/14/2013)   Sleep apnea    "dx'd; couldn't wear the mask" (03/14/2013)   Symptomatic anemia 03/08/2022   Tobacco abuse     Past Surgical History:  Procedure Laterality Date   ATRIAL FLUTTER ABLATION  2002   atrial flutter; subsequently developed atrial fibrillation   AV NODE ABLATION  01/24/2013   CARDIAC CATHETERIZATION  2002   CARDIAC CATHETERIZATION N/A 10/16/2014   Procedure: Left Heart Cath and Coronary Angiography;  Surgeon: [name], MD;  Location: [place] [facility];  Service: Cardiovascular;  Laterality: N/A;   CARDIOVERSION  05/31/2011   Procedure: CARDIOVERSION;  Surgeon: Cristopher Estimable. Lattie Haw, MD;  Location: [facility];  Service: Cardiovascular;  Laterality: N/A;   CARPAL TUNNEL RELEASE Left  1980's   COLONOSCOPY WITH PROPOFOL N/A 12/30/2018   Procedure: COLONOSCOPY WITH PROPOFOL;  Surgeon: Jonathon Bellows, MD;  Location: [facility] ENDOSCOPY;  Service: Gastroenterology;  Laterality: N/A;   COLONOSCOPY WITH PROPOFOL N/A 12/04/2019   Procedure: COLONOSCOPY WITH PROPOFOL;  Surgeon: Jonathon Bellows, MD;  Location[facility] ENDOSCOPY;  Service: Gastroenterology;  Laterality: N/A;   COLONOSCOPY WITH PROPOFOL N/A 06/13/2021   Procedure: COLONOSCOPY WITH PROPOFOL;  Surgeon: Jonathon Bellows, MD;  Location: [facility] ENDOSCOPY;  Service: Gastroenterology;  Laterality: N/A;   ESOPHAGOGASTRODUODENOSCOPY N/A 03/01/2022   Procedure: ESOPHAGOGASTRODUODENOSCOPY (EGD);  Surgeon: Clarene Essex, MD;  Location: [facility] END[facility];  Service: Gastroenterology;  Laterality: N/A;   GIVENS CAPSULE  STUDY Left 03/10/2022   Procedure: GIVENS CAPSULE STUDY;  Surgeon: Arta Silence, MD;  Location: [facility] ENDOSCOPY;  Service: Gastroenterology;  Laterality: Left;   INSERT / REPLACE / REMOVE PACEMAKER  01/24/2013    Medtronic Adapta L dual-chamber pacemaker, serial number [id number]    LEFT HEART CATH AND CORONARY ANGIOGRAPHY N/A 06/07/2018   Procedure: LEFT HEART CATH AND CORONARY ANGIOGRAPHY;  Surgeon: [name], MD;  Location: [place] [facility];  Service: Cardiovascular;  Laterality: N/A;   LEFT HEART CATHETERIZATION WITH CORONARY ANGIOGRAM N/A 03/17/2013   Procedure: LEFT HEART CATHETERIZATION WITH CORONARY ANGIOGRAM;  Surgeon: Burnell Blanks, MD; LAD mild dz, D1 branch 100%, inferior branch 99%, CFX OK, RCA 50%, EF 65%     LOOP RECORDER IMPLANT  2002   PERMANENT PACEMAKER INSERTION N/A 01/24/2013   Procedure: PERMANENT PACEMAKER INSERTION;  Surgeon: [name], MD;  Location: [facility] CATH LAB;  Service: Cardiovascular;  Laterality: N/A;   TIBIAL TUBERCLERPLASTY  ~ 2003    Current Medications: Current Meds  Medication Sig   albuterol (VENTOLIN HFA) 108 (90 Base) MCG/ACT inhaler INHALE 2 PUFFS BY MOUTH EVERY 4 HOURS AS NEEDED FOR WHEEZE OR FOR SHORTNESS OF BREATH   apixaban (ELIQUIS) 5 MG TABS tablet Take 1 tablet (5 mg total) by mouth 2 (two) times daily.   Ascorbic Acid (VITAMIN C) 1000 MG tablet Take 2,000 mg by mouth daily.   blood glucose meter kit and supplies KIT Dispense based on patient and insurance preference. Use up to four times daily as directed. (FOR ICD-9 250.00, 250.01).   budesonide-formoterol (SYMBICORT) 160-4.5 MCG/ACT inhaler INHALE 2 PUFFS INTO THE LUNGS TWICE A DAY (Patient taking differently: Inhale 2 puffs into the lungs daily as needed (for shortness of breath and wheezing).)   fluticasone (FLONASE) 50 MCG/ACT nasal spray PLACE 1 SPRAY INTO BOTH NOSTRILS DAILY AS NEEDED FOR ALLERGIES OR RHINITIS.   gabapentin (NEURONTIN) 300 MG capsule TAKE 1 CAPSULE BY  MOUTH EVERY MORNING AND 2 CAPSULES BY MOUTH AT NIGHT FOR NEUROPATHY   glipiZIDE (GLUCOTROL XL) 10 MG 24 hr tablet TAKE 1 TABLET (10 MG TOTAL) BY MOUTH DAILY WITH BREAKFAST. FOR DIABETES.   glucose blood test strip 1 each by Other route as needed for other. Use to check blood sugar up to three times a day-Accu-Chek meter   isosorbide mononitrate (IMDUR) 30 MG 24 hr tablet Take 1 tablet (30 mg total) by mouth daily.   JARDIANCE 25 MG TABS tablet TAKE 1 TABLET (25 MG TOTAL) BY MOUTH DAILY BEFORE BREAKFAST. FOR DIABETES.   lisinopril (ZESTRIL) 2.5 MG tablet TAKE 1 TABLET BY MOUTH EVERY DAY   nitroGLYCERIN (NITROSTAT) 0.4 MG SL tablet Place 1 tablet (0.4 mg total) under the tongue every 5 (five) minutes as needed for chest pain. Do not exceed 3 tablets in 24 hours.  propranolol ER (INDERAL LA) 80 MG 24 hr capsule Take 1 capsule (80 mg total) by mouth at bedtime. For tremors   rosuvastatin (CRESTOR) 20 MG tablet TAKE 1 TABLET BY MOUTH EVERY DAY IN THE EVENING FOR CHOLESTEROL   sertraline (ZOLOFT) 50 MG tablet TAKE 1 TABLET (50 MG TOTAL) BY MOUTH DAILY. FOR ANXIETY AND DEPRESSION.   sulfamethoxazole-trimethoprim (BACTRIM DS) 800-160 MG tablet Take 1 tablet by mouth 2 (two) times daily.   tamsulosin (FLOMAX) 0.4 MG CAPS capsule TAKE 1 CAPSULE (0.4 MG TOTAL) BY MOUTH 2 (TWO) TIMES DAILY.   TYLENOL 500 MG tablet Take 500-1,000 mg by mouth every 6 (six) hours as needed for mild pain or headache.     Allergies:   Januvia [sitagliptin] and Metformin and related   Social History   Socioeconomic History   Marital status: Widowed    Spouse name: Not on file   Number of children: 1   Years of education: Not on file   Highest education level: Not on file  Occupational History   Occupation: Unemployed    Employer: UNEMPLOYED  Tobacco Use   Smoking status: Former    Packs/day: 1.00    Years: 42.00    Total pack years: 42.00    Types: Cigarettes    Quit date: 12/30/2013    Years since quitting: 8.4    Smokeless tobacco: Never   Tobacco comments:    Former smoker 03/20/22  Vaping Use   Vaping Use: Never used  Substance and Sexual Activity   Alcohol use: Not Currently    Comment: 03/14/2013 "stopped drinking back in 2002; never had problem w/it"   Drug use: No   Sexual activity: Not Currently  Other Topics Concern   Not on file  Social History Narrative   Single.   Retired.    1 son, deceased.    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   Enjoys playing on the computer.       Social Determinants of Health   Financial Resource Strain: Low Risk  (10/07/2021)   Overall Financial Resource Strain (CARDIA)    Difficulty of Paying Living Expenses: Not hard at all  Food Insecurity: No Food Insecurity (05/17/2022)   Hunger Vital Sign    Worried About Running Out of Food in the Last Year: Never true    Ran Out of Food in the Last Year: Never true  Transportation Needs: No Transportation Needs (05/17/2022)   PRAPARE - Hydrologist (Medical): No    Lack of Transportation (Non-Medical): No  Physical Activity: Unknown (10/07/2021)   Exercise Vital Sign    Days of Exercise per Week: 3 days    Minutes of Exercise per Session: Not on file  Stress: No Stress Concern Present (10/07/2021)   [place]    Feeling of Stress : Not at all  Social Connections: Socially Isolated (10/07/2021)   Social Connection and Isolation Panel [NHANES]    Frequency of Communication with Friends and Family: Once a week    Frequency of Social Gatherings with Friends and Family: Once a week    Attends Religious Services: Never    Marine scientist or Organizations: No    Attends Archivist Meetings: Never    Marital Status: Widowed    Family History: The patient's family history includes Alzheimer's disease in his mother; Osteoporosis in his mother.  ROS:   Please  see the history of present illness.    All other systems reviewed and are negative.  EKGs/Labs/Other Studies Reviewed:    The following studies were reviewed today:  CT Jun 05, 2022:  IMPRESSION: 1. The left atrial appendage is a large chicken wing morphology without thrombus.   2. A 31 mm Watchman FLX is recommended based on average landing zone diameter due to oval shape. Shallow LAA depth noted which may preclude this device from successful deployment. Structural heart team discussion recommended.   3. There is no thrombus in the left atrial appendage.   4. A mid/mid IAS puncture site is recommended.   5. Optimal deployment angle: RAO 25 CRA 16   6. Dilated aortic root up to 40 mm.   Recent Labs: 03/08/2022: Magnesium 2.1 04/07/2022: ALT 10; B Natriuretic Peptide 80.2 04/24/2022: Hemoglobin 10.0; Platelets 172 04/28/2022: BUN 20; Creatinine, Ser 1.26; Potassium 3.9; Sodium 141; TSH 1.17   Recent Lipid Panel    Component Value Date/Time   CHOL 132 10/26/2021 1243   CHOL 147 05/28/2019 1202   CHOL 125 01/23/2013 0151   TRIG 166.0 (H) 10/26/2021 1243   TRIG 208 (H) 01/23/2013 0151   TRIG 289 05/05/2008 0000   HDL 50.10 10/26/2021 1243   HDL 54 05/28/2019 1202   HDL 33 (L) 01/23/2013 0151   CHOLHDL 3 10/26/2021 1243   VLDL 33.2 10/26/2021 1243   VLDL 42 (H) 01/23/2013 0151   LDLCALC 49 10/26/2021 1243   LDLCALC 69 05/28/2019 1202   LDLCALC 56 03/28/2017 1433   LDLCALC 50 01/23/2013 0151   LDLCALC 119 05/05/2008 0000   Risk Assessment/Calculations:    CHA2DS2-VASc Score = 4   This indicates a 4.8% annual risk of stroke. The patient's score is based upon: CHF History: 0 HTN History: 1 Diabetes History: 1 Stroke History: 0 Vascular Disease History: 1 Age Score: 1 Gender Score: 0   HAS-BLED score = 2 Hypertension No  Abnormal renal and liver function (Dialysis, transplant, Cr >2.26 mg/dL /Cirrhosis or Bilirubin >2x Normal or AST/ALT/AP >3x Normal) No  Stroke No   Bleeding Yes  Labile INR (Unstable/high INR) No  Elderly (>65) Yes  Drugs or alcohol (? 8 drinks/week, anti-plt or NSAID) No   Physical Exam:    VS:  BP 110/64   Pulse 89   Ht '5\' 11"'$  (1.803 m)   Wt 214 lb (97.1 kg)   SpO2 96%   BMI 29.85 kg/m     Wt Readings from Last 3 Encounters:  06/16/22 214 lb (97.1 kg)  05/26/22 216 lb 6.4 oz (98.2 kg)  05/01/22 213 lb (96.6 kg)    General: Well developed, well nourished, NAD Neck: Negative for carotid bruits. No JVD Lungs:Clear to ausculation bilaterally. No wheezes, rales, or rhonchi. Breathing is unlabored. Cardiovascular: RRR with S1 S2. No murmurs Extremities: No edema. Neuro: Alert and oriented. No focal deficits. No facial asymmetry. MAE spontaneously. Psych: Responds to questions appropriately with normal affect.    ASSESSMENT/PLAN:    Persistent atrial fibrillation: Referred to Dr. Burt Knack for [place] closure with Watchman due to GI bleeding requiring multiple transfusions. Patient with NYHA class I symptoms today. Tolerating low dose Eliquis with no overt bleeding in stool or urine. Pre procedure instructions reviewed with all questions answered. CHG soap given. Obtain CBC, BMET today.   Hx of CHB s/p PPM placement: Stable with last remote transmission showing stable function with no changes.   HTN: Stable with no changes needed at this time.   HLD: Stable,  continue current regimen   DM2: Pre-procedure instructions reviewed.    Medication Adjustments/Labs and Tests Ordered: Current medicines are reviewed at length with the patient today.  Concerns regarding medicines are outlined above.  Orders Placed This Encounter  Procedures   Basic metabolic panel   CBC   No orders of the defined types were placed in this encounter.   Patient Instructions  Medication Instructions:  Your physician recommends that you continue on your current medications as directed. Please refer to the Current Medication list given to you today.   *If you need a refill on your cardiac medications before your next appointment, please call your pharmacy*   Lab Work: TODAY: BMET, CBC If you have labs (blood work) drawn today and your tests are completely normal, you will receive your results only by: [place] (if you have MyChart) OR A paper copy in the mail If you have any lab test that is abnormal or we need to change your treatment, we will call you to review the results.   Testing/Procedures: SEE INSTRUCTION LETTER   Follow-Up: At [facility], you and your health needs are our priority.  As part of our continuing mission to provide you with exceptional heart care, we have created designated Provider Care Teams.  These Care Teams include your primary Cardiologist (physician) and Advanced Practice Providers (APPs -  Physician Assistants and Nurse Practitioners) who all work together to provide you with the care you need, when you need it.  We recommend signing up for the patient portal called "MyChart".  Sign up information is provided on this After Visit Summary.  MyChart is used to connect with patients for Virtual Visits (Telemedicine).  Patients are able to view lab/test results, encounter notes, upcoming appointments, etc.  Non-urgent messages can be sent to your provider as well.   To learn more about what you can do with MyChart, go to [email or web address].    Your next appointment:   POST PROCEDURE   Signed, Kathyrn Drown, NP  06/16/2022 1:15 PM    [facility]

## 2022-06-14 NOTE — Progress Notes (Signed)
[facility]                                     Cardiology Office Note:    Date:  06/16/2022   ID:  [name] Cook, DOB Nov 06, 1950, MRN [id number]  PCP:  [name], NP  [facility] Cardiologist:  Candee Furbish, MD  [facility] Electrophysiologist:  [name], MD   Referring MD: [name], NP   Chief Complaint  Patient presents with   Follow-up    Pre LAAO    History of Present Illness:    [name] Cook is a 72 y.o. male with a hx of CHB s/p PPM, CAD, DM2, HLD, HTN, OSA, tobacco use, GI bleeding on AC and atrial flutter/ atrial fibrillation who was referred to Dr. Burt Knack for [place] closure with Watchman.   [name] Cook was initially diagnosed with atrial fibrillation in 2012 and failed several AAD and DCCVs. He had an AV node ablation in 2014. He was on Plavix and warfarin but was admitted 02/27/22 with an acute GI bleed that required transfusion. He has required a total of 5 PRBC infusions. Warfarin was held at that time. He was readmitted 03/08/22 with an acute PE and was restarted on Eliquis. He has tolerated this well however due to his hx of bleeding on [facility], he was interested in pursuing long term stroke prevention with Watchman.  Today he is here alone and reports that he has been doing well on low dose Eliquis with no bleeding in stool or urine. He denies chest pain, SOB, orthopnea, LE edema, palpitations, dizziness, presyncope, or syncope.   Past Medical History:  Diagnosis Date   Acute bronchitis with COPD ([place]) 03/07/2021   Arthritis    "knees and lower back" (03/14/2013)   Atrial flutter ([place])    radiofrequency ablation in 2001   CAD (coronary artery disease)    a. Nonobstructive. Cardiac cath in 2001-50% mid RI, normal LM, LAD, RCA b. cath 10/16/2014 95% mid RCA treated with DES, 99% ost D1 medical management due to small aneurysmal segment   Chronic anticoagulation    chronic Coumadin anticoagulation   Chronic obstructive pulmonary disease  ([place]) 04/20/2011   Diabetes mellitus, type 2 (HCC)    Elevated lipase 06/23/2021   GERD (gastroesophageal reflux disease)    Hyperlipidemia    Hypertension    with hypertensive heart disease   Left knee pain 10/25/2017   medial   Obesity    Persistent atrial fibrillation ([place])    recurrent atrial flutter since 2001 s/p DCCVs, multiple failed AADs, h/o tachy-mediated cardiomyopathy   Shortness of breath    "can come on at any time" (03/14/2013)   Sleep apnea    "dx'd; couldn't wear the mask" (03/14/2013)   Symptomatic anemia 03/08/2022   Tobacco abuse     Past Surgical History:  Procedure Laterality Date   ATRIAL FLUTTER ABLATION  2002   atrial flutter; subsequently developed atrial fibrillation   AV NODE ABLATION  01/24/2013   CARDIAC CATHETERIZATION  2002   CARDIAC CATHETERIZATION N/A 10/16/2014   Procedure: Left Heart Cath and Coronary Angiography;  Surgeon: [name], MD;  Location: [place] [facility];  Service: Cardiovascular;  Laterality: N/A;   CARDIOVERSION  05/31/2011   Procedure: CARDIOVERSION;  Surgeon: Cristopher Estimable. Lattie Haw, MD;  Location: [facility];  Service: Cardiovascular;  Laterality: N/A;   CARPAL TUNNEL RELEASE Left  1980's   COLONOSCOPY WITH PROPOFOL N/A 12/30/2018   Procedure: COLONOSCOPY WITH PROPOFOL;  Surgeon: Jonathon Bellows, MD;  Location: [facility] ENDOSCOPY;  Service: Gastroenterology;  Laterality: N/A;   COLONOSCOPY WITH PROPOFOL N/A 12/04/2019   Procedure: COLONOSCOPY WITH PROPOFOL;  Surgeon: Jonathon Bellows, MD;  Location: [facility] ENDOSCOPY;  Service: Gastroenterology;  Laterality: N/A;   COLONOSCOPY WITH PROPOFOL N/A 06/13/2021   Procedure: COLONOSCOPY WITH PROPOFOL;  Surgeon: Jonathon Bellows, MD;  Location: [facility] ENDOSCOPY;  Service: Gastroenterology;  Laterality: N/A;   ESOPHAGOGASTRODUODENOSCOPY N/A 03/01/2022   Procedure: ESOPHAGOGASTRODUODENOSCOPY (EGD);  Surgeon: Clarene Essex, MD;  Location: [facility] ENDOSCOPY;  Service: Gastroenterology;  Laterality: N/A;   GIVENS CAPSULE  STUDY Left 03/10/2022   Procedure: GIVENS CAPSULE STUDY;  Surgeon: Arta Silence, MD;  Location: [facility] ENDOSCOPY;  Service: Gastroenterology;  Laterality: Left;   INSERT / REPLACE / REMOVE PACEMAKER  01/24/2013    Medtronic Adapta L dual-chamber pacemaker, serial number NWE [id number]    LEFT HEART CATH AND CORONARY ANGIOGRAPHY N/A 06/07/2018   Procedure: LEFT HEART CATH AND CORONARY ANGIOGRAPHY;  Surgeon: [name], MD;  Location: [place] [facility];  Service: Cardiovascular;  Laterality: N/A;   LEFT HEART CATHETERIZATION WITH CORONARY ANGIOGRAM N/A 03/17/2013   Procedure: LEFT HEART CATHETERIZATION WITH CORONARY ANGIOGRAM;  Surgeon: Burnell Blanks, MD; LAD mild dz, D1 branch 100%, inferior branch 99%, CFX OK, RCA 50%, EF 65%     LOOP RECORDER IMPLANT  2002   PERMANENT PACEMAKER INSERTION N/A 01/24/2013   Procedure: PERMANENT PACEMAKER INSERTION;  Surgeon: [name], MD;  Location: [facility] CATH LAB;  Service: Cardiovascular;  Laterality: N/A;   TIBIAL TUBERCLERPLASTY  ~ 2003    Current Medications: Current Meds  Medication Sig   albuterol (VENTOLIN HFA) 108 (90 Base) MCG/ACT inhaler INHALE 2 PUFFS BY MOUTH EVERY 4 HOURS AS NEEDED FOR WHEEZE OR FOR SHORTNESS OF BREATH   apixaban (ELIQUIS) 5 MG TABS tablet Take 1 tablet (5 mg total) by mouth 2 (two) times daily.   Ascorbic Acid (VITAMIN C) 1000 MG tablet Take 2,000 mg by mouth daily.   blood glucose meter kit and supplies KIT Dispense based on patient and insurance preference. Use up to four times daily as directed. (FOR ICD-9 250.00, 250.01).   budesonide-formoterol (SYMBICORT) 160-4.5 MCG/ACT inhaler INHALE 2 PUFFS INTO THE LUNGS TWICE A DAY (Patient taking differently: Inhale 2 puffs into the lungs daily as needed (for shortness of breath and wheezing).)   fluticasone (FLONASE) 50 MCG/ACT nasal spray PLACE 1 SPRAY INTO BOTH NOSTRILS DAILY AS NEEDED FOR ALLERGIES OR RHINITIS.   gabapentin (NEURONTIN) 300 MG capsule TAKE 1 CAPSULE BY  MOUTH EVERY MORNING AND 2 CAPSULES BY MOUTH AT NIGHT FOR NEUROPATHY   glipiZIDE (GLUCOTROL XL) 10 MG 24 hr tablet TAKE 1 TABLET (10 MG TOTAL) BY MOUTH DAILY WITH BREAKFAST. FOR DIABETES.   glucose blood test strip 1 each by Other route as needed for other. Use to check blood sugar up to three times a day-Accu-Chek meter   isosorbide mononitrate (IMDUR) 30 MG 24 hr tablet Take 1 tablet (30 mg total) by mouth daily.   JARDIANCE 25 MG TABS tablet TAKE 1 TABLET (25 MG TOTAL) BY MOUTH DAILY BEFORE BREAKFAST. FOR DIABETES.   lisinopril (ZESTRIL) 2.5 MG tablet TAKE 1 TABLET BY MOUTH EVERY DAY   nitroGLYCERIN (NITROSTAT) 0.4 MG SL tablet Place 1 tablet (0.4 mg total) under the tongue every 5 (five) minutes as needed for chest pain. Do not exceed 3 tablets in 24 hours.  propranolol ER (INDERAL LA) 80 MG 24 hr capsule Take 1 capsule (80 mg total) by mouth at bedtime. For tremors   rosuvastatin (CRESTOR) 20 MG tablet TAKE 1 TABLET BY MOUTH EVERY DAY IN THE EVENING FOR CHOLESTEROL   sertraline (ZOLOFT) 50 MG tablet TAKE 1 TABLET (50 MG TOTAL) BY MOUTH DAILY. FOR ANXIETY AND DEPRESSION.   sulfamethoxazole-trimethoprim (BACTRIM DS) 800-160 MG tablet Take 1 tablet by mouth 2 (two) times daily.   tamsulosin (FLOMAX) 0.4 MG CAPS capsule TAKE 1 CAPSULE (0.4 MG TOTAL) BY MOUTH 2 (TWO) TIMES DAILY.   TYLENOL 500 MG tablet Take 500-1,000 mg by mouth every 6 (six) hours as needed for mild pain or headache.     Allergies:   Januvia [sitagliptin] and Metformin and related   Social History   Socioeconomic History   Marital status: Widowed    Spouse name: Not on file   Number of children: 1   Years of education: Not on file   Highest education level: Not on file  Occupational History   Occupation: Unemployed    Employer: UNEMPLOYED  Tobacco Use   Smoking status: Former    Packs/day: 1.00    Years: 42.00    Total pack years: 42.00    Types: Cigarettes    Quit date: 12/30/2013    Years since quitting: 8.4    Smokeless tobacco: Never   Tobacco comments:    Former smoker 03/20/22  Vaping Use   Vaping Use: Never used  Substance and Sexual Activity   Alcohol use: Not Currently    Comment: 03/14/2013 "stopped drinking back in 2002; never had problem w/it"   Drug use: No   Sexual activity: Not Currently  Other Topics Concern   Not on file  Social History Narrative   Single.   Retired.    1 son, deceased.    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   Enjoys playing on the computer.       Social Determinants of Health   Financial Resource Strain: Low Risk  (10/07/2021)   Overall Financial Resource Strain (CARDIA)    Difficulty of Paying Living Expenses: Not hard at all  Food Insecurity: No Food Insecurity (05/17/2022)   Hunger Vital Sign    Worried About Running Out of Food in the Last Year: Never true    Ran Out of Food in the Last Year: Never true  Transportation Needs: No Transportation Needs (05/17/2022)   PRAPARE - Hydrologist (Medical): No    Lack of Transportation (Non-Medical): No  Physical Activity: Unknown (10/07/2021)   Exercise Vital Sign    Days of Exercise per Week: 3 days    Minutes of Exercise per Session: Not on file  Stress: No Stress Concern Present (10/07/2021)   [place]    Feeling of Stress : Not at all  Social Connections: Socially Isolated (10/07/2021)   Social Connection and Isolation Panel [NHANES]    Frequency of Communication with Friends and Family: Once a week    Frequency of Social Gatherings with Friends and Family: Once a week    Attends Religious Services: Never    Marine scientist or Organizations: No    Attends Archivist Meetings: Never    Marital Status: Widowed    Family History: The patient's family history includes Alzheimer's disease in his mother; Osteoporosis in his mother.  ROS:   Please  see the history of present illness.    All other systems reviewed and are negative.  EKGs/Labs/Other Studies Reviewed:    The following studies were reviewed today:  CT May 18, 2022:  IMPRESSION: 1. The left atrial appendage is a large chicken wing morphology without thrombus.   2. A 31 mm Watchman FLX is recommended based on average landing zone diameter due to oval shape. Shallow LAA depth noted which may preclude this device from successful deployment. Structural heart team discussion recommended.   3. There is no thrombus in the left atrial appendage.   4. A mid/mid IAS puncture site is recommended.   5. Optimal deployment angle: RAO 25 CRA 16   6. Dilated aortic root up to 40 mm.   Recent Labs: 03/08/2022: Magnesium 2.1 04/07/2022: ALT 10; B Natriuretic Peptide 80.2 04/24/2022: Hemoglobin 10.0; Platelets 172 04/28/2022: BUN 20; Creatinine, Ser 1.26; Potassium 3.9; Sodium 141; TSH 1.17   Recent Lipid Panel    Component Value Date/Time   CHOL 132 10/26/2021 1243   CHOL 147 05/28/2019 1202   CHOL 125 01/23/2013 0151   TRIG 166.0 (H) 10/26/2021 1243   TRIG 208 (H) 01/23/2013 0151   TRIG 289 05/05/2008 0000   HDL 50.10 10/26/2021 1243   HDL 54 05/28/2019 1202   HDL 33 (L) 01/23/2013 0151   CHOLHDL 3 10/26/2021 1243   VLDL 33.2 10/26/2021 1243   VLDL 42 (H) 01/23/2013 0151   LDLCALC 49 10/26/2021 1243   LDLCALC 69 05/28/2019 1202   LDLCALC 56 03/28/2017 1433   LDLCALC 50 01/23/2013 0151   LDLCALC 119 05/05/2008 0000   Risk Assessment/Calculations:    CHA2DS2-VASc Score = 4   This indicates a 4.8% annual risk of stroke. The patient's score is based upon: CHF History: 0 HTN History: 1 Diabetes History: 1 Stroke History: 0 Vascular Disease History: 1 Age Score: 1 Gender Score: 0   HAS-BLED score = 2 Hypertension No  Abnormal renal and liver function (Dialysis, transplant, Cr >2.26 mg/dL /Cirrhosis or Bilirubin >2x Normal or AST/ALT/AP >3x Normal) No  Stroke No   Bleeding Yes  Labile INR (Unstable/high INR) No  Elderly (>65) Yes  Drugs or alcohol (? 8 drinks/week, anti-plt or NSAID) No   Physical Exam:    VS:  BP 110/64   Pulse 89   Ht '5\' 11"'$  (1.803 m)   Wt 214 lb (97.1 kg)   SpO2 96%   BMI 29.85 kg/m     Wt Readings from Last 3 Encounters:  06/16/22 214 lb (97.1 kg)  05/26/22 216 lb 6.4 oz (98.2 kg)  05/01/22 213 lb (96.6 kg)    General: Well developed, well nourished, NAD Neck: Negative for carotid bruits. No JVD Lungs:Clear to ausculation bilaterally. No wheezes, rales, or rhonchi. Breathing is unlabored. Cardiovascular: RRR with S1 S2. No murmurs Extremities: No edema. Neuro: Alert and oriented. No focal deficits. No facial asymmetry. MAE spontaneously. Psych: Responds to questions appropriately with normal affect.    ASSESSMENT/PLAN:    Persistent atrial fibrillation: Referred to Dr. Burt Knack for [place] closure with Watchman due to GI bleeding requiring multiple transfusions. Patient with NYHA class I symptoms today. Tolerating low dose Eliquis with no overt bleeding in stool or urine. Pre procedure instructions reviewed with all questions answered. CHG soap given. Obtain CBC, BMET today.   Hx of CHB s/p PPM placement: Stable with last remote transmission showing stable function with no changes.   HTN: Stable with no changes needed at this time.   HLD: Stable,  continue current regimen   DM2: Pre-procedure instructions reviewed.    Medication Adjustments/Labs and Tests Ordered: Current medicines are reviewed at length with the patient today.  Concerns regarding medicines are outlined above.  Orders Placed This Encounter  Procedures   Basic metabolic panel   CBC   No orders of the defined types were placed in this encounter.   Patient Instructions  Medication Instructions:  Your physician recommends that you continue on your current medications as directed. Please refer to the Current Medication list given to you today.   *If you need a refill on your cardiac medications before your next appointment, please call your pharmacy*   Lab Work: TODAY: BMET, CBC If you have labs (blood work) drawn today and your tests are completely normal, you will receive your results only by: [place] (if you have MyChart) OR A paper copy in the mail If you have any lab test that is abnormal or we need to change your treatment, we will call you to review the results.   Testing/Procedures: SEE INSTRUCTION LETTER   Follow-Up: At [facility], you and your health needs are our priority.  As part of our continuing mission to provide you with exceptional heart care, we have created designated Provider Care Teams.  These Care Teams include your primary Cardiologist (physician) and Advanced Practice Providers (APPs -  Physician Assistants and Nurse Practitioners) who all work together to provide you with the care you need, when you need it.  We recommend signing up for the patient portal called "MyChart".  Sign up information is provided on this After Visit Summary.  MyChart is used to connect with patients for Virtual Visits (Telemedicine).  Patients are able to view lab/test results, encounter notes, upcoming appointments, etc.  Non-urgent messages can be sent to your provider as well.   To learn more about what you can do with MyChart, go to [email or web address].    Your next appointment:   POST PROCEDURE   Signed, Kathyrn Drown, NP  06/16/2022 1:15 PM     [facility]

## 2022-06-15 ENCOUNTER — Other Ambulatory Visit: Payer: Self-pay | Admitting: Primary Care

## 2022-06-15 ENCOUNTER — Other Ambulatory Visit: Payer: 59

## 2022-06-15 DIAGNOSIS — E1165 Type 2 diabetes mellitus with hyperglycemia: Secondary | ICD-10-CM

## 2022-06-15 DIAGNOSIS — C61 Malignant neoplasm of prostate: Secondary | ICD-10-CM

## 2022-06-16 ENCOUNTER — Ambulatory Visit: Payer: 59 | Attending: Cardiovascular Disease | Admitting: Cardiology

## 2022-06-16 VITALS — BP 110/64 | HR 89 | Ht 71.0 in | Wt 214.0 lb

## 2022-06-16 DIAGNOSIS — I4821 Permanent atrial fibrillation: Secondary | ICD-10-CM | POA: Diagnosis not present

## 2022-06-16 DIAGNOSIS — Z01818 Encounter for other preprocedural examination: Secondary | ICD-10-CM | POA: Diagnosis not present

## 2022-06-16 DIAGNOSIS — Z950 Presence of cardiac pacemaker: Secondary | ICD-10-CM | POA: Diagnosis not present

## 2022-06-16 DIAGNOSIS — I25119 Atherosclerotic heart disease of native coronary artery with unspecified angina pectoris: Secondary | ICD-10-CM | POA: Diagnosis not present

## 2022-06-16 DIAGNOSIS — I482 Chronic atrial fibrillation, unspecified: Secondary | ICD-10-CM | POA: Diagnosis not present

## 2022-06-16 DIAGNOSIS — I442 Atrioventricular block, complete: Secondary | ICD-10-CM

## 2022-06-16 DIAGNOSIS — I10 Essential (primary) hypertension: Secondary | ICD-10-CM

## 2022-06-16 DIAGNOSIS — K922 Gastrointestinal hemorrhage, unspecified: Secondary | ICD-10-CM | POA: Diagnosis not present

## 2022-06-16 LAB — PSA: Prostate Specific Ag, Serum: <0.1 ng/mL (ref 0.0–4.0)

## 2022-06-16 NOTE — Patient Instructions (Signed)
Medication Instructions:  Your physician recommends that you continue on your current medications as directed. Please refer to the Current Medication list given to you today.  *If you need a refill on your cardiac medications before your next appointment, please call your pharmacy*   Lab Work: TODAY: BMET, CBC If you have labs (blood work) drawn today and your tests are completely normal, you will receive your results only by: [place] (if you have MyChart) OR A paper copy in the mail If you have any lab test that is abnormal or we need to change your treatment, we will call you to review the results.   Testing/Procedures: SEE INSTRUCTION LETTER   Follow-Up: At [facility], you and your health needs are our priority.  As part of our continuing mission to provide you with exceptional heart care, we have created designated Provider Care Teams.  These Care Teams include your primary Cardiologist (physician) and Advanced Practice Providers (APPs -  Physician Assistants and Nurse Practitioners) who all work together to provide you with the care you need, when you need it.  We recommend signing up for the patient portal called "MyChart".  Sign up information is provided on this After Visit Summary.  MyChart is used to connect with patients for Virtual Visits (Telemedicine).  Patients are able to view lab/test results, encounter notes, upcoming appointments, etc.  Non-urgent messages can be sent to your provider as well.   To learn more about what you can do with MyChart, go to [email or web address].    Your next appointment:   POST PROCEDURE

## 2022-06-17 LAB — BASIC METABOLIC PANEL
BUN/Creatinine Ratio: 21 (ref 10–24)
BUN: 22 mg/dL (ref 8–27)
CO2: 21 mmol/L (ref 20–29)
Calcium: 9 mg/dL (ref 8.6–10.2)
Chloride: 106 mmol/L (ref 96–106)
Creatinine, Ser: 1.06 mg/dL (ref 0.76–1.27)
Glucose: 119 mg/dL — ABNORMAL HIGH (ref 70–99)
Potassium: 4.7 mmol/L (ref 3.5–5.2)
Sodium: 141 mmol/L (ref 134–144)
eGFR: 75 mL/min/{1.73_m2} (ref >59)

## 2022-06-17 LAB — CBC
Hematocrit: 37.2 % — ABNORMAL LOW (ref 37.5–51.0)
Hemoglobin: 11 g/dL — ABNORMAL LOW (ref 13.0–17.7)
MCH: 26.3 pg — ABNORMAL LOW (ref 26.6–33.0)
MCHC: 29.6 g/dL — ABNORMAL LOW (ref 31.5–35.7)
MCV: 89 fL (ref 79–97)
Platelets: 153 10*3/uL (ref 150–450)
RBC: 4.18 x10E6/uL (ref 4.14–5.80)
RDW: 17.5 % — ABNORMAL HIGH (ref 11.6–15.4)
WBC: 4.1 10*3/uL (ref 3.4–10.8)

## 2022-06-20 NOTE — Progress Notes (Unsigned)
12/20/2021 10:54 AM   [name] Cook 1950-11-25 [id number]  Referring provider: [name], NP [name] Cook,  [name] Cook [id number]  Chief Complaint  Patient presents with   Prostate Cancer   Benign Prostatic Hypertrophy   Erectile Dysfunction   Urological history: 1. ED -contributing factors of age, CAD, sleep apnea, COPD, history of smoking, HTN, HLD and diabetes - not a candidate for PDE5i's secondary due nitrate use  2. Prostate cancer -PSA (05/2022) <0.1 -iPSA 6.10 -prostate biopsy (2020) - Prostate biopsy revealed 6/12 cores, all on the right including 3 cores of Gleason 3+3 (up to 99%) and 3 cores of 3+4 up to 96%.  High PSA 6.1.  Rectal exam was abnormal with a nodule at the right apex and firm on the side.  TRUS vol 50 with small median lobe, irregular on right. -cT2 prostate cancer, high-volume intermediate risk involving right-sided gland only -treatment with EBRT and short-term Lupron.  He received his dose in 06/2018.  He completed radiation in 08/2018.  6. BPH w/LU TS -I PSS *** -Tamsulosin 0.4 mg daily  HPI: [name] Cook is a 73 y.o. male who presents today for 6 month follow up.   He visited [facility] ON 05/26/2022 for painless gross hematuria.  He is placed on an antibiotic and urine culture returned negative for infection.    IPSS     Row Name 12/20/21 1000         International Prostate Symptom Score   How often have you had the sensation of not emptying your bladder? Not at All     How often have you had to urinate less than every two hours? Less than 1 in 5 times     How often have you found you stopped and started again several times when you urinated? Not at All     How often have you found it difficult to postpone urination? Not at All     How often have you had a weak urinary stream? Not at All     How often have you had to strain to start urination? Not at All     How many times did you typically get up at night to  urinate? 2 Times     Total IPSS Score 3       Quality of Life due to urinary symptoms   If you were to spend the rest of your life with your urinary condition just the way it is now how would you feel about that? Delighted              Score:  1-7 Mild 8-19 Moderate 20-35 Severe   PMH: Past Medical History:  Diagnosis Date   Acute bronchitis with COPD ([place]) 03/07/2021   Arthritis    "knees and lower back" (03/14/2013)   Atrial flutter ([place])    radiofrequency ablation in 2001   CAD (coronary artery disease)    a. Nonobstructive. Cardiac cath in 2001-50% mid RI, normal LM, LAD, RCA b. cath 10/16/2014 95% mid RCA treated with DES, 99% ost D1 medical management due to small aneurysmal segment   Chronic anticoagulation    chronic Coumadin anticoagulation   Chronic obstructive pulmonary disease ([place]) 04/20/2011   Diabetes mellitus, type 2 (HCC)    Elevated lipase 06/23/2021   GERD (gastroesophageal reflux disease)    Hyperlipidemia    Hypertension    with hypertensive heart disease   Left knee pain 10/25/2017  medial   Obesity    Persistent atrial fibrillation (HCC)    recurrent atrial flutter since 2001 s/p DCCVs, multiple failed AADs, h/o tachy-mediated cardiomyopathy   Shortness of breath    "can come on at any time" (03/14/2013)   Sleep apnea    "dx'd; couldn't wear the mask" (03/14/2013)   Tobacco abuse     Surgical History: Past Surgical History:  Procedure Laterality Date   ATRIAL FLUTTER ABLATION  2002   atrial flutter; subsequently developed atrial fibrillation   AV NODE ABLATION  01/24/2013   CARDIAC CATHETERIZATION  2002   CARDIAC CATHETERIZATION N/A 10/16/2014   Procedure: Left Heart Cath and Coronary Angiography;  Surgeon: [name], MD;  Location: [facility];  Service: Cardiovascular;  Laterality: N/A;   CARDIOVERSION  05/31/2011   Procedure: CARDIOVERSION;  Surgeon: Cristopher Estimable. Lattie Haw, MD;  Location: [facility];  Service: Cardiovascular;  Laterality:  N/A;   CARPAL TUNNEL RELEASE Left 1980's   COLONOSCOPY WITH PROPOFOL N/A 12/30/2018   Procedure: COLONOSCOPY WITH PROPOFOL;  Surgeon: Jonathon Bellows, MD;  Location: [facility] ENDOSCOPY;  Service: Gastroenterology;  Laterality: N/A;   COLONOSCOPY WITH PROPOFOL N/A 12/04/2019   Procedure: COLONOSCOPY WITH PROPOFOL;  Surgeon: Jonathon Bellows, MD;  Location: [facility] ENDOSCOPY;  Service: Gastroenterology;  Laterality: N/A;   COLONOSCOPY WITH PROPOFOL N/A 06/13/2021   Procedure: COLONOSCOPY WITH PROPOFOL;  Surgeon: Jonathon Bellows, MD;  Location: [facility] ENDOSCOPY;  Service: Gastroenterology;  Laterality: N/A;   INSERT / REPLACE / REMOVE PACEMAKER  01/24/2013    Medtronic Adapta L dual-chamber pacemaker, serial number [id number]    LEFT HEART CATH AND CORONARY ANGIOGRAPHY N/A 06/07/2018   Procedure: LEFT HEART CATH AND CORONARY ANGIOGRAPHY;  Surgeon: [name], MD;  Location: [facility];  Service: Cardiovascular;  Laterality: N/A;   LEFT HEART CATHETERIZATION WITH CORONARY ANGIOGRAM N/A 03/17/2013   Procedure: LEFT HEART CATHETERIZATION WITH CORONARY ANGIOGRAM;  Surgeon: Burnell Blanks, MD; LAD mild dz, D1 branch 100%, inferior branch 99%, CFX OK, RCA 50%, EF 65%     LOOP RECORDER IMPLANT  2002   PERMANENT PACEMAKER INSERTION N/A 01/24/2013   Procedure: PERMANENT PACEMAKER INSERTION;  Surgeon: [name], MD;  Location: [facility] CATH LAB;  Service: Cardiovascular;  Laterality: N/A;   TIBIAL TUBERCLERPLASTY  ~ 2003    Home Medications:  Allergies as of 12/20/2021       Reactions   Januvia [sitagliptin] Other (See Comments)   Elevated lipase   Metformin And Related Diarrhea        Medication List        Accurate as of December 20, 2021 10:54 AM. If you have any questions, ask your nurse or doctor.          albuterol 108 (90 Base) MCG/ACT inhaler Commonly known as: VENTOLIN HFA INHALE 2 PUFFS BY MOUTH EVERY 4 HOURS AS NEEDED FOR WHEEZE OR FOR SHORTNESS OF BREATH   blood glucose meter kit  and supplies Kit Dispense based on patient and insurance preference. Use up to four times daily as directed. (FOR ICD-9 250.00, 250.01).   cetirizine 10 MG tablet Commonly known as: ZYRTEC TAKE 1 TABLET BY MOUTH EVERYDAY AT BEDTIME   clopidogrel 75 MG tablet Commonly known as: PLAVIX TAKE 1 TABLET BY MOUTH EVERY DAY   diclofenac Sodium 1 % Gel Commonly known as: Voltaren Apply 2 g topically 4 (four) times daily.   empagliflozin 25 MG Tabs tablet Commonly known as: JARDIANCE Take 1 tablet (25 mg total) by  mouth daily before breakfast. For diabetes.   fish oil-omega-3 fatty acids 1000 MG capsule Take 1 g by mouth daily.   fluticasone 50 MCG/ACT nasal spray Commonly known as: FLONASE PLACE 1 SPRAY INTO BOTH NOSTRILS DAILY AS NEEDED FOR ALLERGIES OR RHINITIS.   furosemide 20 MG tablet Commonly known as: LASIX TAKE 1 TABLET BY MOUTH EVERY DAY   gabapentin 300 MG capsule Commonly known as: NEURONTIN TAKE 1 CAPSULE BY MOUTH EVERY MORNING AND 2 CAPSULES BY MOUTH AT NIGHT FOR NEUROPATHY   glipiZIDE 10 MG 24 hr tablet Commonly known as: GLUCOTROL XL TAKE 1 TABLET (10 MG TOTAL) BY MOUTH DAILY WITH BREAKFAST. FOR DIABETES.   glucose blood test strip 1 each by Other route as needed for other. Use to check blood sugar up to three times a day-Accu-Chek meter   isosorbide mononitrate 60 MG 24 hr tablet Commonly known as: IMDUR TAKE 1 TABLET BY MOUTH EVERY DAY   lisinopril 2.5 MG tablet Commonly known as: ZESTRIL TAKE 1 TABLET BY MOUTH EVERY DAY   nitroGLYCERIN 0.4 MG SL tablet Commonly known as: NITROSTAT Place 1 tablet (0.4 mg total) under the tongue every 5 (five) minutes as needed for chest pain.   pantoprazole 40 MG tablet Commonly known as: PROTONIX TAKE 1 TABLET (40 MG TOTAL) BY MOUTH DAILY FOR HEARTBURN.   rosuvastatin 20 MG tablet Commonly known as: CRESTOR TAKE 1 TABLET BY MOUTH EVERY DAY IN THE EVENING FOR CHOLESTEROL   sertraline 50 MG tablet Commonly known  as: Zoloft Take 1 tablet (50 mg total) by mouth daily. for anxiety and depression.   Symbicort 160-4.5 MCG/ACT inhaler Generic drug: budesonide-formoterol INHALE 2 PUFFS INTO THE LUNGS TWICE A DAY   tamsulosin 0.4 MG Caps capsule Commonly known as: FLOMAX TAKE 1 CAPSULE (0.4 MG TOTAL) BY MOUTH 2 (TWO) TIMES DAILY.   warfarin 5 MG tablet Commonly known as: COUMADIN Take as directed by the anticoagulation clinic. If you are unsure how to take this medication, talk to your nurse or doctor. Original instructions: TAKE 1/2 TABLET DAILY BY MOUTH EXCEPT TAKE 1 TABLET ON MONDAYS AND THURSDAYS OR AS DIRECTED BY ANTICOAGULATION CLINIC        Allergies:  Allergies  Allergen Reactions   Januvia [Sitagliptin] Other (See Comments)    Elevated lipase   Metformin And Related Diarrhea    Family History: Family History  Problem Relation Age of Onset   Alzheimer's disease Mother    Osteoporosis Mother     Social History:  reports that he quit smoking about 7 years ago. His smoking use included cigarettes. He has a 42.00 pack-year smoking history. He has never used smokeless tobacco. He reports that he does not currently use alcohol. He reports that he does not use drugs.  ROS: Pertinent ROS in HPI  Physical Exam: BP 105/66   Pulse 80   Ht '5\' 11"'$  (1.803 m)   Wt 213 lb (96.6 kg)   BMI 29.71 kg/m   Constitutional:  Well nourished. Alert and oriented, No acute distress. HEENT: [place] AT, moist mucus membranes.  Trachea midline Cardiovascular: No clubbing, cyanosis, or edema. Respiratory: Normal respiratory effort, no increased work of breathing. GU: No CVA tenderness.  No bladder fullness or masses.  Patient with circumcised/uncircumcised phallus. ***Foreskin easily retracted***  Urethral meatus is patent.  No penile discharge. No penile lesions or rashes. Scrotum without lesions, cysts, rashes and/or edema.  Testicles are located scrotally bilaterally. No masses are appreciated in the  testicles. Left and right  epididymis are normal. Rectal: Patient with  normal sphincter tone. Anus and perineum without scarring or rashes. No rectal masses are appreciated. Prostate is approximately *** grams, *** nodules are appreciated. Seminal vesicles are normal. Neurologic: Grossly intact, no focal deficits, moving all 4 extremities. Psychiatric: Normal mood and affect.   Laboratory Data: Serum creatinine (06/2022) 1.06 PSA (05/2022) <0.1  Component     Latest Ref Rng 05/26/2022  WBC, UA     0 - 5 /hpf 0-5   RBC, UA     0 - 2 /hpf   Epithelial Cells (non renal)     0 - 10 /hpf None seen   Casts     None seen /lpf None seen   Cast Type     N/A    Mucus, UA     Not Estab.    Bacteria, UA     None seen/Few  None seen   Color, Urine     YELLOW    Appearance     CLEAR    Specific Gravity, Urine     1.005 - 1.030    pH     5.0 - 8.0    Glucose, UA     NEGATIVE mg/dL   Hgb urine dipstick     NEGATIVE    Bilirubin Urine     NEGATIVE    Ketones, ur     NEGATIVE mg/dL   Protein     NEGATIVE mg/dL   Nitrite     NEGATIVE    Leukocytes,UA     NEGATIVE    RBC / HPF     0 - 5 RBC/hpf   Squamous Epithelial / HPF     0 - 5    Mucus   Leukocytes,Ua     NEGATIVE    RBC, Urine     0 - 2 /hpf 3-10 !     Legend: ! Abnormal  Urine Culture Order: [id number] Status: Final result     Visible to patient: Yes (not seen)     Next appt: 06/21/2022 at 10:00 AM in Urology [facility], PA-C)     Dx: Urinary tract infection without hemat...   Specimen Information: Urine  Specimen Comment: Urine  0 Result Notes    Component 3 wk ago  Urine Culture, Routine Final report  Organism ID, Bacteria Comment  Comment: Mixed urogenital flora Less than 10,000 colonies/mL  Resulting Agency [id number]         Narrative Performed by: Maryan Puls Performed at:  [facility] [address], [place], [place]  [id number] Lab Director: Rush Farmer MD, Phone:  [phone]     Specimen Collected: 05/26/22 16:43 Last Resulted: 05/29/22 16:36      I have reviewed the labs.   Pertinent Imaging: N/A   Assessment & Plan:    1. Gross hematuria -Advised him that painless gross hematuria may be a symptom of a renal or bladder cancer and that AUA guidelines recommend a CT urogram and cystoscopy for further workup ***  2. ED - insurance would not cover the IPP - scheduled for ICI titration with Dr. Kipp Brood  3. Prostate cancer -PSA undetectable -Continue PSA every 6 months  4. BPH with LUTS -continue conservative management, avoiding bladder irritants and timed voiding's -Continue tamsulosin 0.4 mg daily     Return in about 6 months (around 06/20/2022) for PSA, I PSS .  These notes generated with voice recognition software. I apologize for typographical errors.  Lexi Conaty, PA-C  [facility] [address]  [place], [place] [address] [phone]

## 2022-06-20 NOTE — Progress Notes (Signed)
Patient scheduled for TEE and left atrial appendage occlusion on 3/7/20024 with Dr. [name].  Secure email sent to [employer] with information.  Left voice mail for [employer] as well.  Santiago Glad, RN in Cath Lab made aware patient has a pacemaker.

## 2022-06-21 ENCOUNTER — Encounter: Payer: Self-pay | Admitting: Urology

## 2022-06-21 ENCOUNTER — Ambulatory Visit (INDEPENDENT_AMBULATORY_CARE_PROVIDER_SITE_OTHER): Payer: 59 | Admitting: Urology

## 2022-06-21 ENCOUNTER — Encounter: Payer: Self-pay | Admitting: Internal Medicine

## 2022-06-21 VITALS — BP 96/68 | HR 88 | Ht 71.0 in | Wt 212.1 lb

## 2022-06-21 DIAGNOSIS — C61 Malignant neoplasm of prostate: Secondary | ICD-10-CM | POA: Diagnosis not present

## 2022-06-21 DIAGNOSIS — N529 Male erectile dysfunction, unspecified: Secondary | ICD-10-CM

## 2022-06-21 DIAGNOSIS — R310 Gross hematuria: Secondary | ICD-10-CM | POA: Diagnosis not present

## 2022-06-21 DIAGNOSIS — N401 Enlarged prostate with lower urinary tract symptoms: Secondary | ICD-10-CM

## 2022-06-21 DIAGNOSIS — N138 Other obstructive and reflux uropathy: Secondary | ICD-10-CM

## 2022-06-21 NOTE — Progress Notes (Signed)
[facility] DEVICE PROGRAMMING  Patient Information: Name:  AAQIL MASSUCCI  DOB:  09/03/1950  MRN:  [id number]  Planned Procedure:  TEE and left atrial appendage occlusion  Surgeon:  Dr. [name]  Date of Procedure:  06/22/2022  Cautery will be used.  Position during surgery:  Supine   Device Information:  Clinic EP Physician:  [name], MD   Device Type:  Pacemaker Manufacturer and Phone #:  Medtronic: [id number] Pacemaker Dependent?:  Yes.   Date of Last Device Check:  06/10/2022 Normal Device Function?:  Yes.    Electrophysiologist's Recommendations:  Have magnet available. Provide continuous ECG monitoring when magnet is used or reprogramming is to be performed.  Procedure will likely interfere with device function.  Device should be programmed:  Asynchronous pacing during procedure and returned to normal programming after procedure  Per Device Clinic Standing Orders, Damian Leavell, RN  10:24 AM 06/21/2022

## 2022-06-22 ENCOUNTER — Inpatient Hospital Stay (HOSPITAL_COMMUNITY): Payer: 59

## 2022-06-22 ENCOUNTER — Encounter (HOSPITAL_COMMUNITY)
Admission: RE | Disposition: A | Discharge status: Home / Self Care | Payer: 59 | Source: Home / Self Care | Attending: Cardiovascular Disease

## 2022-06-22 ENCOUNTER — Inpatient Hospital Stay (HOSPITAL_COMMUNITY): Payer: 59 | Admitting: Anesthesiology

## 2022-06-22 ENCOUNTER — Other Ambulatory Visit: Payer: Self-pay

## 2022-06-22 ENCOUNTER — Inpatient Hospital Stay (HOSPITAL_COMMUNITY)
Admission: RE | Admit: 2022-06-22 | Discharge: 2022-06-23 | DRG: 274 | Disposition: A | Discharge status: Home / Self Care | Payer: 59 | Attending: Cardiovascular Disease | Admitting: Cardiovascular Disease

## 2022-06-22 ENCOUNTER — Encounter (HOSPITAL_COMMUNITY): Payer: Self-pay | Admitting: Cardiovascular Disease

## 2022-06-22 DIAGNOSIS — I2510 Atherosclerotic heart disease of native coronary artery without angina pectoris: Secondary | ICD-10-CM | POA: Diagnosis present

## 2022-06-22 DIAGNOSIS — G4733 Obstructive sleep apnea (adult) (pediatric): Secondary | ICD-10-CM | POA: Diagnosis present

## 2022-06-22 DIAGNOSIS — Z7951 Long term (current) use of inhaled steroids: Secondary | ICD-10-CM | POA: Diagnosis not present

## 2022-06-22 DIAGNOSIS — I4821 Permanent atrial fibrillation: Secondary | ICD-10-CM

## 2022-06-22 DIAGNOSIS — I252 Old myocardial infarction: Secondary | ICD-10-CM

## 2022-06-22 DIAGNOSIS — G4730 Sleep apnea, unspecified: Secondary | ICD-10-CM

## 2022-06-22 DIAGNOSIS — Z87891 Personal history of nicotine dependence: Secondary | ICD-10-CM | POA: Diagnosis not present

## 2022-06-22 DIAGNOSIS — Z95818 Presence of other cardiac implants and grafts: Secondary | ICD-10-CM | POA: Diagnosis not present

## 2022-06-22 DIAGNOSIS — Z79899 Other long term (current) drug therapy: Secondary | ICD-10-CM | POA: Diagnosis not present

## 2022-06-22 DIAGNOSIS — Z006 Encounter for examination for normal comparison and control in clinical research program: Secondary | ICD-10-CM | POA: Diagnosis not present

## 2022-06-22 DIAGNOSIS — Z560 Unemployment, unspecified: Secondary | ICD-10-CM

## 2022-06-22 DIAGNOSIS — J449 Chronic obstructive pulmonary disease, unspecified: Secondary | ICD-10-CM | POA: Diagnosis not present

## 2022-06-22 DIAGNOSIS — Z950 Presence of cardiac pacemaker: Secondary | ICD-10-CM | POA: Diagnosis present

## 2022-06-22 DIAGNOSIS — Z1152 Encounter for screening for COVID-19: Secondary | ICD-10-CM | POA: Diagnosis not present

## 2022-06-22 DIAGNOSIS — I442 Atrioventricular block, complete: Secondary | ICD-10-CM | POA: Diagnosis not present

## 2022-06-22 DIAGNOSIS — E785 Hyperlipidemia, unspecified: Secondary | ICD-10-CM | POA: Diagnosis not present

## 2022-06-22 DIAGNOSIS — I429 Cardiomyopathy, unspecified: Secondary | ICD-10-CM | POA: Diagnosis not present

## 2022-06-22 DIAGNOSIS — I10 Essential (primary) hypertension: Secondary | ICD-10-CM | POA: Diagnosis not present

## 2022-06-22 DIAGNOSIS — E1165 Type 2 diabetes mellitus with hyperglycemia: Secondary | ICD-10-CM | POA: Diagnosis present

## 2022-06-22 DIAGNOSIS — Z820 Family history of epilepsy and other diseases of the nervous system: Secondary | ICD-10-CM

## 2022-06-22 DIAGNOSIS — Z955 Presence of coronary angioplasty implant and graft: Secondary | ICD-10-CM | POA: Diagnosis not present

## 2022-06-22 DIAGNOSIS — N182 Chronic kidney disease, stage 2 (mild): Secondary | ICD-10-CM | POA: Diagnosis present

## 2022-06-22 DIAGNOSIS — Z7984 Long term (current) use of oral hypoglycemic drugs: Secondary | ICD-10-CM | POA: Diagnosis not present

## 2022-06-22 DIAGNOSIS — Z01818 Encounter for other preprocedural examination: Secondary | ICD-10-CM | POA: Diagnosis not present

## 2022-06-22 DIAGNOSIS — I4891 Unspecified atrial fibrillation: Secondary | ICD-10-CM | POA: Diagnosis present

## 2022-06-22 DIAGNOSIS — K219 Gastro-esophageal reflux disease without esophagitis: Secondary | ICD-10-CM | POA: Diagnosis present

## 2022-06-22 DIAGNOSIS — E119 Type 2 diabetes mellitus without complications: Secondary | ICD-10-CM | POA: Diagnosis not present

## 2022-06-22 DIAGNOSIS — Z7901 Long term (current) use of anticoagulants: Secondary | ICD-10-CM | POA: Diagnosis not present

## 2022-06-22 HISTORY — PX: TEE WITHOUT CARDIOVERSION: SHX5443

## 2022-06-22 HISTORY — PX: LEFT ATRIAL APPENDAGE OCCLUSION: EP1229

## 2022-06-22 HISTORY — DX: Presence of other cardiac implants and grafts: Z95.818

## 2022-06-22 LAB — ECHO TEE

## 2022-06-22 LAB — SURGICAL PCR SCREEN
MRSA, PCR: POSITIVE — AB
Staphylococcus aureus: POSITIVE — AB

## 2022-06-22 LAB — POCT ACTIVATED CLOTTING TIME
Activated Clotting Time: 271 seconds
Activated Clotting Time: 271 seconds

## 2022-06-22 LAB — GLUCOSE, CAPILLARY
Glucose-Capillary: 146 mg/dL — ABNORMAL HIGH (ref 70–99)
Glucose-Capillary: 138 mg/dL — ABNORMAL HIGH (ref 70–99)
Glucose-Capillary: 140 mg/dL — ABNORMAL HIGH (ref 70–99)
Glucose-Capillary: 143 mg/dL — ABNORMAL HIGH (ref 70–99)
Glucose-Capillary: 244 mg/dL — ABNORMAL HIGH (ref 70–99)

## 2022-06-22 LAB — TYPE AND SCREEN
ABO/RH(D): O POS
Antibody Screen: NEGATIVE

## 2022-06-22 SURGERY — LEFT ATRIAL APPENDAGE OCCLUSION
Anesthesia: General

## 2022-06-22 MED ORDER — SODIUM CHLORIDE 0.9 % IV SOLN: INTRAVENOUS | Status: DC

## 2022-06-22 MED ORDER — CHLORHEXIDINE GLUCONATE 4 % EX LIQD: Freq: Once | CUTANEOUS | Status: DC

## 2022-06-22 MED ORDER — DEXAMETHASONE SODIUM PHOSPHATE 10 MG/ML IJ SOLN
INTRAMUSCULAR | Status: DC | PRN
  Administered 2022-06-22: 10 mg via INTRAVENOUS

## 2022-06-22 MED ORDER — SODIUM CHLORIDE 0.9% FLUSH
3.0000 mL | Freq: Two times a day (BID) | INTRAVENOUS | Status: DC
  Administered 2022-06-22 – 2022-06-23 (×2): 3 mL via INTRAVENOUS

## 2022-06-22 MED ORDER — HEPARIN (PORCINE) IN NACL 1000-0.9 UT/500ML-% IV SOLN
INTRAVENOUS | Status: DC | PRN
  Administered 2022-06-22: 500 mL

## 2022-06-22 MED ORDER — FENTANYL CITRATE (PF) 250 MCG/5ML IJ SOLN
INTRAMUSCULAR | Status: DC | PRN
  Administered 2022-06-22: 50 ug via INTRAVENOUS

## 2022-06-22 MED ORDER — PROPOFOL 10 MG/ML IV BOLUS
INTRAVENOUS | Status: DC | PRN
  Administered 2022-06-22: 70 mg via INTRAVENOUS

## 2022-06-22 MED ORDER — HEPARIN SODIUM (PORCINE) 1000 UNIT/ML IJ SOLN
INTRAMUSCULAR | Status: DC | PRN
  Administered 2022-06-22: 12000 [IU] via INTRAVENOUS
  Administered 2022-06-22: 3000 [IU] via INTRAVENOUS

## 2022-06-22 MED ORDER — CHLORHEXIDINE GLUCONATE 0.12 % MT SOLN: 15.0000 mL | Freq: Once | OROMUCOSAL | Status: AC

## 2022-06-22 MED ORDER — SODIUM CHLORIDE 0.9% FLUSH: 3.0000 mL | INTRAVENOUS | Status: DC | PRN

## 2022-06-22 MED ORDER — CHLORHEXIDINE GLUCONATE 0.12 % MT SOLN
OROMUCOSAL | Status: AC
  Administered 2022-06-22: 15 mL via OROMUCOSAL
  Filled 2022-06-22: qty 15

## 2022-06-22 MED ORDER — INSULIN ASPART 100 UNIT/ML IJ SOLN: 0.0000 [IU] | INTRAMUSCULAR | Status: DC | PRN

## 2022-06-22 MED ORDER — SUGAMMADEX SODIUM 200 MG/2ML IV SOLN
INTRAVENOUS | Status: DC | PRN
  Administered 2022-06-22: 200 mg via INTRAVENOUS

## 2022-06-22 MED ORDER — SERTRALINE HCL 25 MG PO TABS
50.0000 mg | ORAL_TABLET | Freq: Every day | ORAL | Status: DC
  Administered 2022-06-23: 50 mg via ORAL
  Filled 2022-06-22: qty 2

## 2022-06-22 MED ORDER — PHENYLEPHRINE HCL-NACL 20-0.9 MG/250ML-% IV SOLN
INTRAVENOUS | Status: DC | PRN
  Administered 2022-06-22: 50 ug/min via INTRAVENOUS

## 2022-06-22 MED ORDER — PHENYLEPHRINE 80 MCG/ML (10ML) SYRINGE FOR IV PUSH (FOR BLOOD PRESSURE SUPPORT)
PREFILLED_SYRINGE | INTRAVENOUS | Status: DC | PRN
  Administered 2022-06-22 (×2): 200 ug via INTRAVENOUS
  Administered 2022-06-22: 80 ug via INTRAVENOUS
  Administered 2022-06-22 (×2): 200 ug via INTRAVENOUS

## 2022-06-22 MED ORDER — HEPARIN (PORCINE) IN NACL 2000-0.9 UNIT/L-% IV SOLN
INTRAVENOUS | Status: DC | PRN
  Administered 2022-06-22: 1000 mL

## 2022-06-22 MED ORDER — LISINOPRIL 2.5 MG PO TABS
2.5000 mg | ORAL_TABLET | Freq: Every day | ORAL | Status: DC
  Administered 2022-06-22: 2.5 mg via ORAL
  Filled 2022-06-22 (×2): qty 1

## 2022-06-22 MED ORDER — PROPRANOLOL HCL ER 80 MG PO CP24
80.0000 mg | ORAL_CAPSULE | Freq: Every day | ORAL | Status: DC
  Filled 2022-06-22: qty 1

## 2022-06-22 MED ORDER — ROCURONIUM BROMIDE 10 MG/ML (PF) SYRINGE
PREFILLED_SYRINGE | INTRAVENOUS | Status: DC | PRN
  Administered 2022-06-22: 100 mg via INTRAVENOUS

## 2022-06-22 MED ORDER — ONDANSETRON HCL 4 MG/2ML IJ SOLN: 4.0000 mg | Freq: Four times a day (QID) | INTRAMUSCULAR | Status: DC | PRN

## 2022-06-22 MED ORDER — ISOSORBIDE MONONITRATE ER 30 MG PO TB24
30.0000 mg | ORAL_TABLET | Freq: Every day | ORAL | Status: DC
  Administered 2022-06-22 – 2022-06-23 (×2): 30 mg via ORAL
  Filled 2022-06-22 (×2): qty 1

## 2022-06-22 MED ORDER — LIDOCAINE HCL 1 % IJ SOLN
INTRAMUSCULAR | Status: AC
  Filled 2022-06-22: qty 20

## 2022-06-22 MED ORDER — LACTATED RINGERS IV SOLN: INTRAVENOUS | Status: DC

## 2022-06-22 MED ORDER — LIDOCAINE 2% (20 MG/ML) 5 ML SYRINGE
INTRAMUSCULAR | Status: DC | PRN
  Administered 2022-06-22: 100 mg via INTRAVENOUS

## 2022-06-22 MED ORDER — ONDANSETRON HCL 4 MG/2ML IJ SOLN
INTRAMUSCULAR | Status: DC | PRN
  Administered 2022-06-22: 4 mg via INTRAVENOUS

## 2022-06-22 MED ORDER — APIXABAN 5 MG PO TABS
5.0000 mg | ORAL_TABLET | Freq: Two times a day (BID) | ORAL | Status: DC
  Administered 2022-06-22 – 2022-06-23 (×2): 5 mg via ORAL
  Filled 2022-06-22 (×2): qty 1

## 2022-06-22 MED ORDER — ROSUVASTATIN CALCIUM 20 MG PO TABS
20.0000 mg | ORAL_TABLET | Freq: Every day | ORAL | Status: DC
  Administered 2022-06-23: 20 mg via ORAL
  Filled 2022-06-22: qty 1

## 2022-06-22 MED ORDER — IOHEXOL 350 MG/ML SOLN
INTRAVENOUS | Status: DC | PRN
  Administered 2022-06-22: 40 mL

## 2022-06-22 MED ORDER — ACETAMINOPHEN 325 MG PO TABS: 650.0000 mg | ORAL_TABLET | ORAL | Status: DC | PRN

## 2022-06-22 MED ORDER — CHLORHEXIDINE GLUCONATE CLOTH 2 % EX PADS
6.0000 | MEDICATED_PAD | Freq: Every day | CUTANEOUS | Status: DC
  Administered 2022-06-23: 6 via TOPICAL

## 2022-06-22 MED ORDER — INSULIN ASPART 100 UNIT/ML IJ SOLN
0.0000 [IU] | Freq: Three times a day (TID) | INTRAMUSCULAR | Status: DC
  Administered 2022-06-23: 3 [IU] via SUBCUTANEOUS

## 2022-06-22 MED ORDER — MUPIROCIN 2 % EX OINT
1.0000 | TOPICAL_OINTMENT | Freq: Two times a day (BID) | CUTANEOUS | Status: DC
  Administered 2022-06-22 – 2022-06-23 (×2): 1 via NASAL
  Filled 2022-06-22: qty 22

## 2022-06-22 MED ORDER — PROTAMINE SULFATE 10 MG/ML IV SOLN
INTRAVENOUS | Status: DC | PRN
  Administered 2022-06-22: 50 mg via INTRAVENOUS
  Administered 2022-06-22: 30 mg via INTRAVENOUS

## 2022-06-22 MED ORDER — SODIUM CHLORIDE 0.9 % IV SOLN: 250.0000 mL | INTRAVENOUS | Status: DC | PRN

## 2022-06-22 MED ORDER — CEFAZOLIN SODIUM-DEXTROSE 2-4 GM/100ML-% IV SOLN
2.0000 g | INTRAVENOUS | Status: AC
  Administered 2022-06-22: 2 g via INTRAVENOUS
  Filled 2022-06-22: qty 100

## 2022-06-22 SURGICAL SUPPLY — 21 items
BLANKET WARM UNDERBOD FULL ACC (MISCELLANEOUS) ×1 IMPLANT
CATH INFINITI 5FR ANG PIGTAIL (CATHETERS) IMPLANT
CLOSURE PERCLOSE PROSTYLE (VASCULAR PRODUCTS) IMPLANT
DEVICE WATCHMAN FLX PROC (KITS) IMPLANT
DILATOR VESSEL 38 20CM 14FR (INTRODUCER) IMPLANT
KIT HEART LEFT (KITS) ×1 IMPLANT
KIT SHEA VERSACROSS LAAC CONNE (KITS) IMPLANT
PACK CARDIAC CATHETERIZATION (CUSTOM PROCEDURE TRAY) ×1 IMPLANT
PAD DEFIB RADIO PHYSIO CONN (PAD) ×1 IMPLANT
SHEATH PERFORMER 16FR 30 (SHEATH) IMPLANT
SHEATH PINNACLE 8F 10CM (SHEATH) IMPLANT
SHEATH PROBE COVER 6X72 (BAG) ×1 IMPLANT
SHIELD RADPAD SCOOP 12X17 (MISCELLANEOUS) ×1 IMPLANT
SYS WATCHMAN FXD DBL (SHEATH) ×1
SYSTEM WATCHMAN FXD DBL (SHEATH) IMPLANT
TRANSDUCER W/STOPCOCK (MISCELLANEOUS) ×1 IMPLANT
TUBING CIL FLEX 10 FLL-RA (TUBING) ×1 IMPLANT
WATCHMAN FLX 27 (Prosthesis & Implant Heart) IMPLANT
WATCHMAN FLX 31 (Prosthesis & Implant Heart) IMPLANT
WATCHMAN FLX PROCEDURE DEVICE (KITS) ×1 IMPLANT
WATCHMAN PROCED TRUSEAL ACCESS (SHEATH) IMPLANT

## 2022-06-22 NOTE — Anesthesia Preprocedure Evaluation (Addendum)
Anesthesia Evaluation  Patient identified by MRN, date of birth, ID band Patient awake    Reviewed: Allergy & Precautions, NPO status , Patient's Chart, lab work & pertinent test results  Airway Mallampati: II  TM Distance: >3 FB Neck ROM: Full    Dental  (+) Edentulous Upper, Edentulous Lower,    Pulmonary sleep apnea , COPD, former smoker   breath sounds clear to auscultation       Cardiovascular hypertension, Pt. on medications + CAD, + Past MI and + Cardiac Stents  + dysrhythmias Atrial Fibrillation + pacemaker  Rhythm:Regular Rate:Normal  ECHO 11/23 1. Left ventricular ejection fraction, by estimation, is 50%. The left  ventricle has low normal function. Left ventricular endocardial border not  optimally defined to evaluate regional wall motion. There is mild  concentric left ventricular hypertrophy.  Left ventricular diastolic parameters are indeterminate.   2. Right ventricular systolic function is normal. The right ventricular  size is normal. There is normal pulmonary artery systolic pressure.   3. Left atrial size was mildly dilated.   4. The mitral valve is grossly normal. No evidence of mitral valve  regurgitation. No evidence of mitral stenosis.   5. The aortic valve is tricuspid. Aortic valve regurgitation is not  visualized. Aortic valve sclerosis is present, with no evidence of aortic  valve stenosis.   6. The inferior vena cava is dilated in size with >50% respiratory  variability, suggesting right atrial pressure of 8 mmHg.     Neuro/Psych  PSYCHIATRIC DISORDERS Anxiety Depression    negative neurological ROS     GI/Hepatic Neg liver ROS,GERD  Medicated,,GI bleed    Endo/Other  diabetes, Type 2    Renal/GU ARFRenal disease     Musculoskeletal  (+) Arthritis ,    Abdominal   Peds  Hematology  (+) Blood dyscrasia, anemia   Anesthesia Other Findings   Reproductive/Obstetrics                              Anesthesia Physical Anesthesia Plan  ASA: 3  Anesthesia Plan: General   Post-op Pain Management:    Induction: Intravenous  PONV Risk Score and Plan: 2 and Ondansetron, Dexamethasone and Treatment may vary due to age or medical condition  Airway Management Planned: Oral ETT  Additional Equipment: ClearSight  Intra-op Plan:   Post-operative Plan: Extubation in OR  Informed Consent: I have reviewed the patients History and Physical, chart, labs and discussed the procedure including the risks, benefits and alternatives for the proposed anesthesia with the patient or authorized representative who has indicated his/her understanding and acceptance.       Plan Discussed with: Anesthesiologist and CRNA  Anesthesia Plan Comments: (  )       Anesthesia Quick Evaluation

## 2022-06-22 NOTE — Discharge Summary (Addendum)
[facility]    Patient ID: [name] Cook,  MRN: [id number], DOB/AGE: [name] 20, 1951 72 y.o.  Admit date: 06/22/2022 Discharge date: 06/22/2022  Primary Care Physician: [name] Koch, NP  Primary Cardiologist: [name] Furbish, MD  Electrophysiologist: [name] Peru, MD  Primary Discharge Diagnosis:  Permanent Atrial Fibrillation Poor candidacy for long term anticoagulation due to h/o GI bleeding  Secondary Discharge Diagnosis:   -CHB s/p PPM -CAD -DM2 -HLD -HTN -OSA -Tobacco use  Procedures This Admission:  Transeptal Puncture Intra-procedural TEE which showed no LAA thrombus Left atrial appendage occlusive device placement on 06/22/22 by Dr. Burt Cook.   This study demonstrated:  Successful LAA occlusion device placement with a 31 mm Watchman FLX device under fluoroscopic and TEE guidance   Recommend:  Apixaban 5 mg BID x 6 weeks, then plavix monotherapy through 6 months Surveillance CTA 8 weeks  Brief HPI: [name] Cook is a 72 y.o. male with a history of CHB s/p PPM, CAD, DM2, HLD, HTN, OSA, tobacco use, GI bleeding on AC and atrial flutter/ atrial fibrillation who was referred to Dr. Burt Cook for [name] Cook closure with Watchman.    [name] Cook was initially diagnosed with atrial fibrillation in 2012 and failed several AAD and DCCVs. He had an AV node ablation in 2014. He was on Plavix and warfarin but was admitted 02/27/22 with an acute GI bleed that required transfusion. He has required a total of 5 PRBC infusions. Warfarin was held at that time. He was readmitted 03/08/22 with an acute PE and was restarted on Eliquis. He has tolerated this well however due to his hx of bleeding on [facility], he was interested in pursuing long term stroke prevention with Watchman scheduled for 06/22/22.   Hospital Course:  The patient was admitted and underwent left atrial appendage occlusive device placement with 28m Watchman FLX device. Groin site was without complication on the day of discharge.  Given his stability he is being considered for same day discharge. Wound care and restrictions were reviewed with the patient. The patient has been scheduled for post procedure follow up with [name] Drown NP in approximately 1 month. Medication plan will be to continue Eliquis '5mg'$  BID for 45 days then will he will transition to Plavix monotherapy through 6 months duration. He will require dental SBE for 6 months. He will undergo repeat CT at 8 weeks to ensure proper seal of the device.    Hx of CHB s/p PPM placement: Stable with last remote transmission showing stable function with no changes.    HTN: Stable with no changes needed at this time.    HLD: Stable, continue current regimen  DM2: Restarted on home regimen  Physical Exam: Vitals:   06/22/22 1455 06/22/22 1500 06/22/22 1505 06/22/22 1525  BP: 101/73 107/68 108/62 109/78  Pulse: 71 87 80 79  Resp: '10 11 12 '$ (!) 9  Temp:      TempSrc:      SpO2: 98% 98% 97% 97%  Weight:      Height:       Labs:   Lab Results  Component Value Date   WBC 4.1 06/16/2022   HGB 11.0 (L) 06/16/2022   HCT 37.2 (L) 06/16/2022   MCV 89 06/16/2022   PLT 153 06/16/2022    Recent Labs  Lab 06/16/22 1159  NA 141  K 4.7  CL 106  CO2 21  BUN 22  CREATININE 1.06  CALCIUM 9.0  GLUCOSE 119*   Discharge Medications:  Allergies as of 06/22/2022       Reactions   Januvia [sitagliptin] Other (See Comments)   Elevated lipase   Metformin And Related Diarrhea        Medication List     TAKE these medications    albuterol 108 (90 Base) MCG/ACT inhaler Commonly known as: VENTOLIN HFA INHALE 2 PUFFS BY MOUTH EVERY 4 HOURS AS NEEDED FOR WHEEZE OR FOR SHORTNESS OF BREATH   apixaban 5 MG Tabs tablet Commonly known as: Eliquis Take 1 tablet (5 mg total) by mouth 2 (two) times daily. Notes to patient: RESTART ELIQUIS TONIGHT 06/22/22   blood glucose meter kit and supplies Kit Dispense based on patient and insurance preference. Use up to four  times daily as directed. (FOR ICD-9 250.00, 250.01).   diphenhydrAMINE 25 MG tablet Commonly known as: BENADRYL Take 50 mg by mouth at bedtime as needed for itching.   fluticasone 50 MCG/ACT nasal spray Commonly known as: FLONASE PLACE 1 SPRAY INTO BOTH NOSTRILS DAILY AS NEEDED FOR ALLERGIES OR RHINITIS.   gabapentin 300 MG capsule Commonly known as: NEURONTIN TAKE 1 CAPSULE BY MOUTH EVERY MORNING AND 2 CAPSULES BY MOUTH AT NIGHT FOR NEUROPATHY   glipiZIDE 10 MG 24 hr tablet Commonly known as: GLUCOTROL XL TAKE 1 TABLET (10 MG TOTAL) BY MOUTH DAILY WITH BREAKFAST. FOR DIABETES.   glucose blood test strip 1 each by Other route as needed for other. Use to check blood sugar up to three times a day-Accu-Chek meter   isosorbide mononitrate 30 MG 24 hr tablet Commonly known as: IMDUR Take 1 tablet (30 mg total) by mouth daily.   Jardiance 25 MG Tabs tablet Generic drug: empagliflozin TAKE 1 TABLET (25 MG TOTAL) BY MOUTH DAILY BEFORE BREAKFAST. FOR DIABETES.   lisinopril 2.5 MG tablet Commonly known as: ZESTRIL TAKE 1 TABLET BY MOUTH EVERY DAY   naproxen sodium 220 MG tablet Commonly known as: ALEVE Take 220 mg by mouth at bedtime.   nitroGLYCERIN 0.4 MG SL tablet Commonly known as: NITROSTAT Place 1 tablet (0.4 mg total) under the tongue every 5 (five) minutes as needed for chest pain. Do not exceed 3 tablets in 24 hours.   pantoprazole 40 MG tablet Commonly known as: PROTONIX Take 1 tablet (40 mg total) by mouth daily.   propranolol ER 80 MG 24 hr capsule Commonly known as: INDERAL LA Take 1 capsule (80 mg total) by mouth at bedtime. For tremors   rosuvastatin 20 MG tablet Commonly known as: CRESTOR TAKE 1 TABLET BY MOUTH EVERY DAY IN THE EVENING FOR CHOLESTEROL   sertraline 50 MG tablet Commonly known as: ZOLOFT TAKE 1 TABLET (50 MG TOTAL) BY MOUTH DAILY. FOR ANXIETY AND DEPRESSION.   sulfamethoxazole-trimethoprim 800-160 MG tablet Commonly known as: Bactrim  DS Take 1 tablet by mouth 2 (two) times daily.   Symbicort 160-4.5 MCG/ACT inhaler Generic drug: budesonide-formoterol INHALE 2 PUFFS INTO THE LUNGS TWICE A DAY What changed: See the new instructions.   tamsulosin 0.4 MG Caps capsule Commonly known as: FLOMAX TAKE 1 CAPSULE (0.4 MG TOTAL) BY MOUTH 2 (TWO) TIMES DAILY. What changed: when to take this   TYLENOL 500 MG tablet Generic drug: acetaminophen Take 1,000 mg by mouth daily.   vitamin C 1000 MG tablet Take 2,000 mg by mouth daily.        Disposition:  Home  Discharge Instructions     Call MD for:  difficulty breathing, headache or visual disturbances   Complete by: As directed  Call MD for:  extreme fatigue   Complete by: As directed    Call MD for:  hives   Complete by: As directed    Call MD for:  persistant dizziness or light-headedness   Complete by: As directed    Call MD for:  persistant nausea and vomiting   Complete by: As directed    Call MD for:  redness, tenderness, or signs of infection (pain, swelling, redness, odor or green/yellow discharge around incision site)   Complete by: As directed    Call MD for:  severe uncontrolled pain   Complete by: As directed    Call MD for:  temperature >100.4   Complete by: As directed    Diet - low sodium heart healthy   Complete by: As directed    Discharge instructions   Complete by: As directed    [facility] Procedure, Care After  Procedure MD: Dr. Armandina Stammer Clinical Coordinator: Lenice Llamas, RN  This sheet gives you information about how to care for yourself after your procedure. Your health care provider may also give you more specific instructions. If you have problems or questions, contact your health care provider.  What can I expect after the procedure? After the procedure, it is common to have: Bruising around your puncture site. Tenderness around your puncture site. Tiredness (fatigue).  Medication instructions It is very important to  continue to take your blood thinner as directed by your doctor after the Watchman procedure. Call your procedure doctor's office with question or concerns. If you are on Coumadin (warfarin), you will have your INR checked the week after your procedure, with a goal INR of 2.0 - 3.0. Please follow your medication instructions on your discharge summary. Only take the medications listed on your discharge paperwork.  RESTART YOUR ELIQUIS TONIGHT, 06/22/22  Follow up You will be seen in 1 month after your procedure You will have a repeat CT scan approximately 8 weeks after your procedure mark to check your device You will follow up the MD/APP who performed your procedure 6 months after your procedure The Watchman Clinical Coordinator will check in with you from time to time, including 1 and 2 years after your procedure.    Follow these instructions at home: Puncture site care  Follow instructions from your health care provider about how to take care of your puncture site. Make sure you: If present, leave stitches (sutures), skin glue, or adhesive strips in place.  If a large square bandage is present, this may be removed 24 hours after surgery.  Check your puncture site every day for signs of infection. Check for: Redness, swelling, or pain. Fluid or blood. If your puncture site starts to bleed, lie down on your back, apply firm pressure to the area, and contact your health care provider. Warmth. Pus or a bad smell. Driving Do not drive yourself home if you received sedation Do not drive for at least 4 days after your procedure or however long your health care provider recommends. (Do not resume driving if you have previously been instructed not to drive for other health reasons.) Do not spend greater than 1 hour at a time in a car for the first 3 days. Stop and take a break with a 5 minute walk at least every hour.  Do not drive or use heavy machinery while taking prescription pain  medicine.  Activity Avoid activities that take a lot of effort, including exercise, for at least 7 days after your procedure. For  the first 3 days, avoid sitting for longer than one hour at a time.  Avoid alcoholic beverages, signing paperwork, or participating in legal proceedings for 24 hours after receiving sedation Do not lift anything that is heavier than 10 lb (4.5 kg) for one week.  No sexual activity for 1 week.  Return to your normal activities as told by your health care provider. Ask your health care provider what activities are safe for you. General instructions Take over-the-counter and prescription medicines only as told by your health care provider. Do not use any products that contain nicotine or tobacco, such as cigarettes and e-cigarettes. If you need help quitting, ask your health care provider. You may shower after 24 hours, but Do not take baths, swim, or use a hot tub for 1 week.  Do not drink alcohol for 24 hours after your procedure. Keep all follow-up visits as told by your health care provider. This is important. Dental Work: You will require antibiotics prior to any dental work, including cleanings, for 6 months after your Watchman implantation to help protect you from infection. After 6 months, antibiotics are no longer required. Contact a health care provider if: You have redness, mild swelling, or pain around your puncture site. You have soreness in your throat or at your puncture site that does not improve after several days You have fluid or blood coming from your puncture site that stops after applying firm pressure to the area. Your puncture site feels warm to the touch. You have pus or a bad smell coming from your puncture site. You have a fever. You have chest pain or discomfort that spreads to your neck, jaw, or arm. You are sweating a lot. You feel nauseous. You have a fast or irregular heartbeat. You have shortness of breath. You are dizzy or  light-headed and feel the need to lie down. You have pain or numbness in the arm or leg closest to your puncture site. Get help right away if: Your puncture site suddenly swells. Your puncture site is bleeding and the bleeding does not stop after applying firm pressure to the area. These symptoms may represent a serious problem that is an emergency. Do not wait to see if the symptoms will go away. Get medical help right away. Call your local emergency services (911 in the [place] Do not drive yourself to the hospital. Summary After the procedure, it is normal to have bruising and tenderness at the puncture site in your groin, neck, or forearm. Check your puncture site every day for signs of infection. Get help right away if your puncture site is bleeding and the bleeding does not stop after applying firm pressure to the area. This is a medical emergency.  This information is not intended to replace advice given to you by your health care provider. Make sure you discuss any questions you have with your health care provider.   Increase activity slowly   Complete by: As directed        Follow-up Information     Tommie Raymond, NP Follow up on 07/24/2022.   Specialty: Cardiology Why: @ 845am. Please arrive at 830am. Contact information: [address] [place] [address] [phone]                 Duration of Discharge Encounter: Greater than 30 minutes including physician time.  Signed, Kathyrn Drown, NP  06/22/2022 3:55 PM   Patient seen, examined. Available data reviewed. Agree with findings, assessment,  and plan as outlined by Kathyrn Drown, NP. Pt seen yesterday evening and didn't feel comfortable with same day DC. He had an uneventful night, no complaints this am. Family at bedside. No CP or dyspnea. Tele shows ventricular pacing 80 bpm. On exam, he is alert, oriented, in NAD. JVP normal, lungs CTA, heart RRR no murmur, abd soft and NT, extremities without  edema, right groin clear with no hematoma or ecchymosis. Pt medically stable for DC today. Reviewed DC instruction, post-procedural care, and medication recommendations. He will take apixaban 5 mg BID x 6 weeks, then clopidogrel monotherapy through 6 months. Watchman surveillance with a cardiac CT scan in 8 weeks.   [name], M.D. 06/23/2022 1:11 PM

## 2022-06-22 NOTE — Anesthesia Procedure Notes (Signed)
Procedure Name: Intubation Date/Time: 06/22/2022 10:11 AM  Performed by: Lance Coon, CRNAPre-anesthesia Checklist: Patient identified, Emergency Drugs available, Suction available, Patient being monitored and Timeout performed Patient Re-evaluated:Patient Re-evaluated prior to induction Oxygen Delivery Method: Circle system utilized Preoxygenation: Pre-oxygenation with 100% oxygen Induction Type: IV induction Ventilation: Mask ventilation without difficulty and Oral airway inserted - appropriate to patient size Laryngoscope Size: Miller and 3 Grade View: Grade I Tube type: Oral Tube size: 7.5 mm Number of attempts: 1 Airway Equipment and Method: Stylet Placement Confirmation: ETT inserted through vocal cords under direct vision, positive ETCO2 and breath sounds checked- equal and bilateral Secured at: 22 cm Tube secured with: Tape Dental Injury: Teeth and Oropharynx as per pre-operative assessment

## 2022-06-22 NOTE — Interval H&P Note (Signed)
History and Physical Interval Note:  06/22/2022 9:33 AM  [name] Cook  has presented today for surgery, with the diagnosis of afib.  The various methods of treatment have been discussed with the patient and family. After consideration of risks, benefits and other options for treatment, the patient has consented to  Procedure(s): LEFT ATRIAL APPENDAGE OCCLUSION (N/A) TRANSESOPHAGEAL ECHOCARDIOGRAM (N/A) as a surgical intervention.  The patient's history has been reviewed, patient examined, no change in status, stable for surgery.  I have reviewed the patient's chart and labs.  Questions were answered to the patient's satisfaction.     [name]

## 2022-06-22 NOTE — Plan of Care (Signed)
  Problem: Education: Goal: Knowledge of cardiac device and self-care will improve Outcome: Progressing Goal: Ability to safely manage health related needs after discharge will improve Outcome: Progressing Goal: Individualized Educational Video(s) Outcome: Progressing   Problem: Cardiac: Goal: Ability to achieve and maintain adequate cardiopulmonary perfusion will improve Outcome: Progressing   Problem: Education: Goal: Knowledge of General Education information will improve Description: Including pain rating scale, medication(s)/side effects and non-pharmacologic comfort measures Outcome: Progressing   Problem: Health Behavior/Discharge Planning: Goal: Ability to manage health-related needs will improve Outcome: Progressing   Problem: Clinical Measurements: Goal: Ability to maintain clinical measurements within normal limits will improve Outcome: Progressing Goal: Will remain free from infection Outcome: Progressing Goal: Diagnostic test results will improve Outcome: Progressing Goal: Respiratory complications will improve Outcome: Progressing Goal: Cardiovascular complication will be avoided Outcome: Progressing   Problem: Activity: Goal: Risk for activity intolerance will decrease Outcome: Progressing   Problem: Nutrition: Goal: Adequate nutrition will be maintained Outcome: Progressing   Problem: Coping: Goal: Level of anxiety will decrease Outcome: Progressing   Problem: Elimination: Goal: Will not experience complications related to bowel motility Outcome: Progressing Goal: Will not experience complications related to urinary retention Outcome: Progressing   Problem: Pain Managment: Goal: General experience of comfort will improve Outcome: Progressing   Problem: Safety: Goal: Ability to remain free from injury will improve Outcome: Progressing   Problem: Skin Integrity: Goal: Risk for impaired skin integrity will decrease Outcome: Progressing    Problem: Coping: Goal: Ability to adjust to condition or change in health will improve Outcome: Progressing   Problem: Fluid Volume: Goal: Ability to maintain a balanced intake and output will improve Outcome: Progressing   Problem: Health Behavior/Discharge Planning: Goal: Ability to identify and utilize available resources and services will improve Outcome: Progressing Goal: Ability to manage health-related needs will improve Outcome: Progressing   Problem: Metabolic: Goal: Ability to maintain appropriate glucose levels will improve Outcome: Progressing   Problem: Nutritional: Goal: Maintenance of adequate nutrition will improve Outcome: Progressing Goal: Progress toward achieving an optimal weight will improve Outcome: Progressing   Problem: Skin Integrity: Goal: Risk for impaired skin integrity will decrease Outcome: Progressing

## 2022-06-22 NOTE — Progress Notes (Signed)
  [facility]   MULTIDISCIPLINARY HEART VALVE TEAM  Patient doing well s/p LAAO. He is hemodynamically stable. Groin sites stable. Plan for early ambulation after bedrest completed and hopeful discharge later today.   Kathyrn Drown NP-C Structural Heart Team  Pager: [phone] Phone: [phone]

## 2022-06-22 NOTE — Anesthesia Postprocedure Evaluation (Signed)
Anesthesia Post Note  Patient: [name] Cook  Procedure(s) Performed: LEFT ATRIAL APPENDAGE OCCLUSION TRANSESOPHAGEAL ECHOCARDIOGRAM     Patient location during evaluation: PACU Anesthesia Type: General Level of consciousness: awake and alert Pain management: pain level controlled Vital Signs Assessment: post-procedure vital signs reviewed and stable Respiratory status: spontaneous breathing, nonlabored ventilation, respiratory function stable and patient connected to nasal cannula oxygen Cardiovascular status: blood pressure returned to baseline and stable Postop Assessment: no apparent nausea or vomiting Anesthetic complications: no   There were no known notable events for this encounter.  Last Vitals:  Vitals:   06/22/22 1245 06/22/22 1250  BP: 96/60 93/63  Pulse: 70 70  Resp: (!) 8 11  Temp:    SpO2: 97% 97%    Last Pain:  Vitals:   06/22/22 1230  TempSrc: Temporal  PainSc:                  Josph Norfleet

## 2022-06-22 NOTE — Transfer of Care (Signed)
Immediate Anesthesia Transfer of Care Note  Patient: [name] Cook  Procedure(s) Performed: LEFT ATRIAL APPENDAGE OCCLUSION TRANSESOPHAGEAL ECHOCARDIOGRAM  Patient Location: Cath Lab  Anesthesia Type:General  Level of Consciousness: drowsy and patient cooperative  Airway & Oxygen Therapy: Patient Spontanous Breathing and Patient connected to nasal cannula oxygen  Post-op Assessment: Report given to RN and Post -op Vital signs reviewed and stable  Post vital signs: Reviewed and stable  Last Vitals:  Vitals Value Taken Time  BP 110/59 06/22/22 1200  Temp 36.4 C 06/22/22 1157  Pulse 70 06/22/22 1201  Resp 22 06/22/22 1201  SpO2 99 % 06/22/22 1201  Vitals shown include unvalidated device data.  Last Pain:  Vitals:   06/22/22 1157  TempSrc:   PainSc: 0-No pain         Complications: There were no known notable events for this encounter.

## 2022-06-22 NOTE — Progress Notes (Signed)
  [facility]   MULTIDISCIPLINARY HEART VALVE TEAM   Initial plan was for same day discharge however the patient would feel more comfortable staying the night. Family updated by Dr. Burt Knack. Discharge order cancelled. Will restart home medications this evening including Eliquis.   Kathyrn Drown NP-C Structural Heart Team  Pager: [phone] Phone: [phone]

## 2022-06-23 ENCOUNTER — Ambulatory Visit: Payer: 59

## 2022-06-23 ENCOUNTER — Encounter (HOSPITAL_COMMUNITY): Payer: Self-pay | Admitting: Cardiovascular Disease

## 2022-06-23 DIAGNOSIS — I442 Atrioventricular block, complete: Secondary | ICD-10-CM

## 2022-06-23 DIAGNOSIS — I4821 Permanent atrial fibrillation: Principal | ICD-10-CM

## 2022-06-23 DIAGNOSIS — Z95818 Presence of other cardiac implants and grafts: Secondary | ICD-10-CM

## 2022-06-23 LAB — BASIC METABOLIC PANEL
Anion gap: 10 (ref 5–15)
BUN: 30 mg/dL — ABNORMAL HIGH (ref 8–23)
CO2: 22 mmol/L (ref 22–32)
Calcium: 8.4 mg/dL — ABNORMAL LOW (ref 8.9–10.3)
Chloride: 104 mmol/L (ref 98–111)
Creatinine, Ser: 1.17 mg/dL (ref 0.61–1.24)
GFR, Estimated: >60 mL/min (ref >60)
Glucose, Bld: 206 mg/dL — ABNORMAL HIGH (ref 70–99)
Potassium: 4.1 mmol/L (ref 3.5–5.1)
Sodium: 136 mmol/L (ref 135–145)

## 2022-06-23 LAB — CUP PACEART REMOTE DEVICE CHECK
Battery Impedance: 1243 Ohm
Battery Remaining Longevity: 57 mo
Battery Voltage: 2.77 V
Brady Statistic RV Percent Paced: 100 %
Date Time Interrogation Session: 20240308100155
Implantable Lead Connection Status: 753985
Implantable Lead Connection Status: 753985
Implantable Lead Implant Date: 20141010
Implantable Lead Implant Date: 20141010
Implantable Lead Location: 753859
Implantable Lead Location: 753860
Implantable Lead Model: 5076
Implantable Lead Model: 5076
Implantable Pulse Generator Implant Date: 20141010
Lead Channel Impedance Value: 535 Ohm
Lead Channel Impedance Value: 67 Ohm
Lead Channel Pacing Threshold Amplitude: 0.75 V
Lead Channel Pacing Threshold Pulse Width: 0.4 ms
Lead Channel Setting Pacing Amplitude: 2.5 V
Lead Channel Setting Pacing Pulse Width: 0.4 ms
Lead Channel Setting Sensing Sensitivity: 4 mV
Zone Setting Status: 755011
Zone Setting Status: 755011

## 2022-06-23 LAB — GLUCOSE, CAPILLARY: Glucose-Capillary: 164 mg/dL — ABNORMAL HIGH (ref 70–99)

## 2022-06-23 LAB — CBC
HCT: 31.2 % — ABNORMAL LOW (ref 39.0–52.0)
Hemoglobin: 9.4 g/dL — ABNORMAL LOW (ref 13.0–17.0)
MCH: 26.6 pg (ref 26.0–34.0)
MCHC: 30.1 g/dL (ref 30.0–36.0)
MCV: 88.1 fL (ref 80.0–100.0)
Platelets: 141 10*3/uL — ABNORMAL LOW (ref 150–400)
RBC: 3.54 MIL/uL — ABNORMAL LOW (ref 4.22–5.81)
RDW: 19.4 % — ABNORMAL HIGH (ref 11.5–15.5)
WBC: 4.7 10*3/uL (ref 4.0–10.5)
nRBC: 0 % (ref 0.0–0.2)

## 2022-06-23 NOTE — Progress Notes (Signed)
[name] Cook to be D/C'd home per MD order. Discussed with the patient and all questions fully answered.  Skin clean, dry and intact without evidence of skin break down, no evidence of skin tears noted. R groin site clean, dry, and intact. IV catheters discontinued intact. Site without signs and symptoms of complications. Dressing and pressure applied.  An After Visit Summary was printed and given to the patient.  Patient escorted via [place], and D/C home via private auto.  [name]  06/23/2022 9:15 AM

## 2022-06-26 ENCOUNTER — Telehealth: Payer: Self-pay | Admitting: *Deleted

## 2022-06-26 ENCOUNTER — Telehealth: Payer: Self-pay

## 2022-06-26 DIAGNOSIS — I4821 Permanent atrial fibrillation: Secondary | ICD-10-CM

## 2022-06-26 DIAGNOSIS — Z95818 Presence of other cardiac implants and grafts: Secondary | ICD-10-CM

## 2022-06-26 NOTE — Transitions of Care (Post Inpatient/ED Visit) (Signed)
   06/26/2022  Name: [name] Cook MRN: [id number] DOB: 07/26/1950  Today's TOC FU Call Status: Today's TOC FU Call Status:: Successful TOC FU Call Competed TOC FU Call Complete Date: 06/26/22  Transition Care Management Follow-up Telephone Call Date of Discharge: November 21, 2052 Discharge Facility: [facility]) Type of Discharge: Inpatient Admission Primary Inpatient Discharge Diagnosis:: Presence of watchman lt appendage closure device How have you been since you were released from the hospital?: Better Any questions or concerns?: No  Items Reviewed: Did you receive and understand the discharge instructions provided?: Yes Medications obtained and verified?: Yes (Medications Reviewed) Any new allergies since your discharge?: No Dietary orders reviewed?: No Do you have support at home?: Yes People in Home: significant other Name of Support/Comfort Primary Source: [facility] and Equipment/Supplies: [place] Ordered?: No Any new equipment or medical supplies ordered?: No  Functional Questionnaire: Do you need assistance with bathing/showering or dressing?: No Do you need assistance with meal preparation?: Yes Do you need assistance with eating?: No Do you have difficulty maintaining continence: No Do you need assistance with getting out of bed/getting out of a chair/moving?: No Do you have difficulty managing or taking your medications?: No  Folllow up appointments reviewed: PCP Follow-up appointment confirmed?: No (coareguide is making f/u appt) MD Provider Line Number:[phone] Given: No Specialist Hospital Follow-up appointment confirmed?: Yes Date of Specialist follow-up appointment?: 07/24/22 Follow-Up Specialty Provider:: Kathyrn Drown NP Cardiology [phone] 8:45 Do you need transportation to your follow-up appointment?: No Do you understand care options if your condition(s) worsen?: Yes-patient verbalized understanding  SDOH Interventions  Today    Flowsheet Row Most Recent Value  SDOH Interventions   Food Insecurity Interventions Intervention Not Indicated  Housing Interventions Intervention Not Indicated  Transportation Interventions Intervention Not Indicated      TOC Interventions Today    Flowsheet Row Most Recent Value  TOC Interventions   TOC Interventions Discussed/Reviewed Arranged PCP follow up less than 12 days/Care Guide scheduled      Interventions Today    Flowsheet Row Most Recent Value  General Interventions   General Interventions Discussed/Reviewed General Interventions Discussed, General Interventions Reviewed, Doctor Visits  Doctor Visits Discussed/Reviewed Doctor Visits Discussed, Doctor Visits Reviewed  [facility] guide scheduled f/u visit]  Exercise Interventions   Exercise Discussed/Reviewed Physical Activity  [RN discussed physical limits for up 7 days]  Physical Activity Discussed/Reviewed Physical Activity Discussed, Physical Activity Reviewed       [place] Management [phone]

## 2022-06-26 NOTE — Telephone Encounter (Signed)
  [facility]   Watchman Team  Contacted the patient regarding discharge from [facility] on 06/23/2022  The patient understands to follow up with Kathyrn Drown on 07/24/2022 in preparation for imaging on 08/17/2022.  The patient understands discharge instructions? Yes  The patient understands medications and regimen? Yes   The patient reports groin site has no S/S of infection or bleeding.  The patient understands to call with any questions or concerns prior to scheduled visit.

## 2022-06-26 NOTE — Transitions of Care (Post Inpatient/ED Visit) (Signed)
   06/26/2022  Name: [name] Cook MRN: [id number] DOB: 09-16-1950  Today's TOC FU Call Status: Today's TOC FU Call Status:: Unsuccessul Call (1st Attempt) Unsuccessful Call (1st Attempt) Date: 06/26/22  Attempted to reach the patient regarding the most recent Inpatient/ED visit.  Follow Up Plan: Additional outreach attempts will be made to reach the patient to complete the Transitions of Care (Post Inpatient/ED visit) call.   [place] Care Management [phone]

## 2022-06-28 ENCOUNTER — Ambulatory Visit: Payer: Self-pay | Admitting: *Deleted

## 2022-06-28 NOTE — Chronic Care Management (AMB) (Signed)
   06/28/2022  SHAYAAN PARKE 1950-09-19 [id number]   Enrollment status changed to previously enrolled.  Jacqlyn Larsen [facility], BSN RN Case Manager [phone]

## 2022-07-02 ENCOUNTER — Other Ambulatory Visit: Payer: Self-pay | Admitting: Internal Medicine

## 2022-07-03 ENCOUNTER — Telehealth: Payer: Self-pay

## 2022-07-03 NOTE — Progress Notes (Signed)
Care Management & Coordination Services Pharmacy Team  Reason for Encounter: Appointment Reminder  Contacted patient to confirm telephone appointment with Charlene Brooke , PharmD on 07/06/22 at 11:00. Unsuccessful outreach. Left voicemail for patient to return call.   Have you seen any other providers since your last visit with PCP? Yes- cardiology,urology    Hospital visits:  06/22/22 thru 06/23/22  Legrand Como Cooper,MD(cardio)- [place] procedure- no admission    Star Rating Drugs:  Medication:  Last Fill: Day Supply Glipizide 109mg  06/06/22 90 Jardiance 25mg  06/16/22  90 Lisinopril 2.5mg  05/01/22 90 Rosuvastatin 20mg  04/03/22 90   Care Gaps: Annual wellness visit in last year? Yes  If Diabetic: Last eye exam / retinopathy screening:UTD Last diabetic foot exam:overdue   Charlene Brooke, PharmD notified  Avel Sensor, [place] Assistant [phone]

## 2022-07-05 ENCOUNTER — Encounter: Payer: Self-pay | Admitting: Primary Care

## 2022-07-05 ENCOUNTER — Ambulatory Visit (INDEPENDENT_AMBULATORY_CARE_PROVIDER_SITE_OTHER): Payer: 59 | Admitting: Primary Care

## 2022-07-05 ENCOUNTER — Other Ambulatory Visit: Payer: Self-pay | Admitting: Primary Care

## 2022-07-05 ENCOUNTER — Other Ambulatory Visit: Payer: Self-pay | Admitting: Internal Medicine

## 2022-07-05 VITALS — BP 110/60 | HR 65 | Temp 98.1°F | Ht 71.0 in | Wt 213.2 lb

## 2022-07-05 DIAGNOSIS — I482 Chronic atrial fibrillation, unspecified: Secondary | ICD-10-CM | POA: Diagnosis not present

## 2022-07-05 DIAGNOSIS — K219 Gastro-esophageal reflux disease without esophagitis: Secondary | ICD-10-CM | POA: Diagnosis not present

## 2022-07-05 DIAGNOSIS — F32A Depression, unspecified: Secondary | ICD-10-CM

## 2022-07-05 DIAGNOSIS — I2699 Other pulmonary embolism without acute cor pulmonale: Secondary | ICD-10-CM

## 2022-07-05 DIAGNOSIS — I4821 Permanent atrial fibrillation: Secondary | ICD-10-CM

## 2022-07-05 DIAGNOSIS — R251 Tremor, unspecified: Secondary | ICD-10-CM

## 2022-07-05 DIAGNOSIS — J309 Allergic rhinitis, unspecified: Secondary | ICD-10-CM

## 2022-07-05 DIAGNOSIS — F339 Major depressive disorder, recurrent, unspecified: Secondary | ICD-10-CM

## 2022-07-05 DIAGNOSIS — F419 Anxiety disorder, unspecified: Secondary | ICD-10-CM

## 2022-07-05 MED ORDER — SERTRALINE HCL 100 MG PO TABS: 100.0000 mg | ORAL_TABLET | Freq: Every day | ORAL | 0 refills | Status: DC

## 2022-07-05 NOTE — Assessment & Plan Note (Addendum)
Overall controlled.   Recommended OTC pepcid as needed.  I evaluated patient, was consulted regarding treatment, and agree with assessment and plan per Tinnie Gens, RN, DNP student.   Allie Bossier, NP-C'

## 2022-07-05 NOTE — Assessment & Plan Note (Addendum)
Followed by cardiology.   Reviewed hospital notes, labs, procedure notes.  Reviewed cardiology notes, labs.   Continue Eliquis 5 mg BID for 6 weeks and then Plavix monotherapy for 6 months. Repeat CTA in 8 weeks.   Follow up with cardiology as scheduled.  Repeat CBC pending.   I evaluated patient, was consulted regarding treatment, and agree with assessment and plan per Tinnie Gens, RN, DNP student.   Allie Bossier, NP-C

## 2022-07-05 NOTE — Assessment & Plan Note (Addendum)
Uncontrolled. Likely situational anxiety given recent cardiac procedure.   Discussed treatment options with patient.   Increase sertraline to 100 mg once daily.   He will update if symptoms worsen or do not improve.  I evaluated patient, was consulted regarding treatment, and agree with assessment and plan per Tinnie Gens, RN, DNP student.   Allie Bossier, NP-C

## 2022-07-05 NOTE — Patient Instructions (Addendum)
Stop by the lab prior to leaving today. I will notify you of your results once received.   You can try pepcid over the counter for indigestion.   Start sertraline 100 mg once a day. Please update if your symptoms do not improve.   It was a pleasure to see you today!

## 2022-07-05 NOTE — Progress Notes (Signed)
Established Patient Office Visit  Subjective   Patient ID: [name] Cook, male    DOB: [birth year]  Age: 72 y.o. MRN: [id number]  Chief Complaint  Patient presents with   Hospitalization Follow-up    HPI  [name] Cook is a 72 year old male with past medical history of hypertension, Atrial flutter, CAD, Pulmonary embolism, Atrial fibrillation, COPD, OSA, GERD, Type 2 diabetes, HLD, pacemaker, hx of prostate cancer, presents today for a hospital follow up.   He was hospitalized for an elective procedure from 06-22-22 to 06-23-22 at [facility]  He had a left atrial appendage occlusion and transesophageal echocardiogram. He had a successful LAA with a 31 mm Watchman FLX device. He was started on Eliquis 5 mg BID x 6 weeks, then Plavix monotherapy through 6 months. It was also recommended to have CTA in 8 weeks.   Since his discharge, he reports that he is feeling a little "weak". He reports that he has no energy and feeling tired. He does sleep well at night. Yesterday, he states that he just didn't feel good. He denies any fever and chills. He reports getting total of 5 unit of RBCs. He had a CBC done on 06/23/22 and his hemoglobin was 9.4 Scheduled to see cardiology on 07/24/22.  He denies any chest pain, palpitations, shortness of breath or difficulty breathing.   Anxiety: He is currently managed on sertraline 50 mg once daily. He reports that the medication is not work. Reports mood swings, irritability. Denies SI/HI.      07/05/2022    3:16 PM 05/26/2022    3:52 PM  GAD 7 : Generalized Anxiety Score  Nervous, Anxious, on Edge 0 0  Control/stop worrying 0 0  Worry too much - different things 0 0  Trouble relaxing 0 0  Restless 2 2  Easily annoyed or irritable 1 2  Afraid - awful might happen 0 0  Total GAD 7 Score 3 4  Anxiety Difficulty Somewhat difficult Not difficult at all      Patient Active Problem List   Diagnosis Date Noted   Atrial fibrillation ([place]) 06/22/2022    Presence of Watchman left atrial appendage closure device 06/22/2022   Urinary tract infection without hematuria 05/26/2022   Tremor of both hands 04/28/2022   Secondary hypercoagulable state ([place]) 03/20/2022   Class 1 obesity 03/09/2022   Pulmonary embolism ([place]) 03/09/2022   GIB (gastrointestinal bleeding) 03/08/2022   Acute pulmonary embolism ([place]) 03/08/2022   Anxiety 03/08/2022   AKI (acute kidney injury) ([place]) 03/08/2022   Diaphoresis 10/26/2021   Shortness of breath 08/19/2021   Neuropathy due to secondary diabetes ([employer]) 07/28/2021   History of diverticulitis 02/06/2020   Preventative health care 10/09/2019   Polyp of colon    History of prostate cancer 06/10/2018   OSA (obstructive sleep apnea) 10/01/2017   Osteoarthritis 06/26/2017   Insomnia 10/01/2014   Anxiety and depression 03/09/2014   Coronary artery disease 11/06/2013   Long term current use of anticoagulant therapy 06/19/2013   Pacemaker 04/30/2013   Acute myocardial infarction, subendocardial infarction, initial episode of care ([place]) 03/15/2013   Abnormal LFTs 05/02/2012   Chronic kidney disease, 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/26/2011   Chronic obstructive pulmonary disease ([place]) 04/20/2011   Hypertension    Hyperlipidemia    Type 2 diabetes mellitus with hyperglycemia ([place])    Atrial flutter ([place])    GASTROESOPHAGEAL REFLUX DISEASE 05/16/2010   Past Medical History:  Diagnosis Date   Acute bronchitis with  COPD ([place]) 03/07/2021   Arthritis    "knees and lower back" (03/14/2013)   Atrial flutter ([place])    radiofrequency ablation in 2001   CAD (coronary artery disease)    a. Nonobstructive. Cardiac cath in 2001-50% mid RI, normal LM, LAD, RCA b. cath 10/16/2014 95% mid RCA treated with DES, 99% ost D1 medical management due to small aneurysmal segment   Chronic anticoagulation    chronic Coumadin anticoagulation   Chronic obstructive pulmonary disease ([place]) 04/20/2011   Diabetes mellitus, type 2 (HCC)    Elevated  lipase 06/23/2021   GERD (gastroesophageal reflux disease)    Hyperlipidemia    Hypertension    with hypertensive heart disease   Left knee pain 10/25/2017   medial   Obesity    Persistent atrial fibrillation ([place])    recurrent atrial flutter since 2001 s/p DCCVs, multiple failed AADs, h/o tachy-mediated cardiomyopathy   Presence of Watchman left atrial appendage closure device 06/22/2022   Watchman  9mm FLX placed with Dr. Burt Knack   Shortness of breath    "can come on at any time" (03/14/2013)   Sleep apnea    "dx'd; couldn't wear the mask" (03/14/2013)   Symptomatic anemia 03/08/2022   Tobacco abuse    Past Surgical History:  Procedure Laterality Date   ATRIAL FLUTTER ABLATION  2002   atrial flutter; subsequently developed atrial fibrillation   AV NODE ABLATION  01/24/2013   CARDIAC CATHETERIZATION  2002   CARDIAC CATHETERIZATION N/A 10/16/2014   Procedure: Left Heart Cath and Coronary Angiography;  Surgeon: [name], MD;  Location: [facility];  Service: Cardiovascular;  Laterality: N/A;   CARDIOVERSION  05/31/2011   Procedure: CARDIOVERSION;  Surgeon: Cristopher Estimable. Lattie Haw, MD;  Location: [facility];  Service: Cardiovascular;  Laterality: N/A;   CARPAL TUNNEL RELEASE Left 1980's   COLONOSCOPY WITH PROPOFOL N/A 12/30/2018   Procedure: COLONOSCOPY WITH PROPOFOL;  Surgeon: Jonathon Bellows, MD;  Location: [facility] ENDOSCOPY;  Service: Gastroenterology;  Laterality: N/A;   COLONOSCOPY WITH PROPOFOL N/A 12/04/2019   Procedure: COLONOSCOPY WITH PROPOFOL;  Surgeon: Jonathon Bellows, MD;  Location: [facility] ENDOSCOPY;  Service: Gastroenterology;  Laterality: N/A;   COLONOSCOPY WITH PROPOFOL N/A 06/13/2021   Procedure: COLONOSCOPY WITH PROPOFOL;  Surgeon: Jonathon Bellows, MD;  Location: [facility] ENDOSCOPY;  Service: Gastroenterology;  Laterality: N/A;   ESOPHAGOGASTRODUODENOSCOPY N/A 03/01/2022   Procedure: ESOPHAGOGASTRODUODENOSCOPY (EGD);  Surgeon: Clarene Essex, MD;  Location: [facility];  Service:  Gastroenterology;  Laterality: N/A;   GIVENS CAPSULE STUDY Left 03/10/2022   Procedure: GIVENS CAPSULE STUDY;  Surgeon: Arta Silence, MD;  Location: [facility] ENDOSCOPY;  Service: Gastroenterology;  Laterality: Left;   INSERT / REPLACE / REMOVE PACEMAKER  01/24/2013    Medtronic Adapta L dual-chamber pacemaker, serial number NWE [id number]    LEFT ATRIAL APPENDAGE OCCLUSION N/A 06/22/2022   Procedure: LEFT ATRIAL APPENDAGE OCCLUSION;  Surgeon: [name], MD;  Location: [facility];  Service: Cardiovascular;  Laterality: N/A;   LEFT HEART CATH AND CORONARY ANGIOGRAPHY N/A 06/07/2018   Procedure: LEFT HEART CATH AND CORONARY ANGIOGRAPHY;  Surgeon: [name], MD;  Location: [facility];  Service: Cardiovascular;  Laterality: N/A;   LEFT HEART CATHETERIZATION WITH CORONARY ANGIOGRAM N/A 03/17/2013   Procedure: LEFT HEART CATHETERIZATION WITH CORONARY ANGIOGRAM;  Surgeon: Burnell Blanks, MD; LAD mild dz, D1 branch 100%, inferior branch 99%, CFX OK, RCA 50%, EF 65%     LOOP RECORDER IMPLANT  2002   PERMANENT PACEMAKER INSERTION N/A 01/24/2013  Procedure: PERMANENT PACEMAKER INSERTION;  Surgeon: [name], MD;  Location[facility] C[facility] LAB;  Service: Cardiovascular;  Laterality: N/A;   TEE WITHOUT CARDIOVERSION N/A 06/22/2022   Procedure: TRANSESOPHAGEAL ECHOCARDIOGRAM;  Surgeon: [name], MD;  Location: [facility];  Service: Cardiovascular;  Laterality: N/A;   TIBIAL TUBERCLERPLASTY  ~ 2003   Social History   Tobacco Use   Smoking status: Former    Packs/day: 1.00    Years: 42.00    Additional pack years: 0.00    Total pack years: 42.00    Types: Cigarettes    Quit date: 12/30/2013    Years since quitting: 8.5   Smokeless tobacco: Never   Tobacco comments:    Former smoker 03/20/22  Vaping Use   Vaping Use: Never used  Substance Use Topics   Alcohol use: Not Currently    Comment: 03/14/2013 "stopped drinking back in 2002; never had problem w/it"   Drug  use: No   Social History   Socioeconomic History   Marital status: Widowed    Spouse name: Not on file   Number of children: 1   Years of education: Not on file   Highest education level: Not on file  Occupational History   Occupation: Unemployed    Employer: UNEMPLOYED  Tobacco Use   Smoking status: Former    Packs/day: 1.00    Years: 42.00    Additional pack years: 0.00    Total pack years: 42.00    Types: Cigarettes    Quit date: 12/30/2013    Years since quitting: 8.5   Smokeless tobacco: Never   Tobacco comments:    Former smoker 03/20/22  Vaping Use   Vaping Use: Never used  Substance and Sexual Activity   Alcohol use: Not Currently    Comment: 03/14/2013 "stopped drinking back in 2002; never had problem w/it"   Drug use: No   Sexual activity: Not Currently  Other Topics Concern   Not on file  Social History Narrative   Single.   Retired.    1 son, deceased.    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   Enjoys playing on the computer.       Social Determinants of Health   Financial Resource Strain: Low Risk  (10/07/2021)   Overall Financial Resource Strain (CARDIA)    Difficulty of Paying Living Expenses: Not hard at all  Food Insecurity: No Food Insecurity (06/26/2022)   Hunger Vital Sign    Worried About Running Out of Food in the Last Year: Never true    Ran Out of Food in the Last Year: Never true  Transportation Needs: No Transportation Needs (06/26/2022)   PRAPARE - Hydrologist (Medical): No    Lack of Transportation (Non-Medical): No  Physical Activity: Unknown (10/07/2021)   Exercise Vital Sign    Days of Exercise per Week: 3 days    Minutes of Exercise per Session: Not on file  Stress: No Stress Concern Present (10/07/2021)   [place]    Feeling of Stress : Not at all  Social Connections: Socially Isolated  (10/07/2021)   Social Connection and Isolation Panel [NHANES]    Frequency of Communication with Friends and Family: Once a week    Frequency of Social Gatherings with Friends and Family: Once a week    Attends Religious Services: Never    Marine scientist or Organizations:  No    Attends Archivist Meetings: Never    Marital Status: Widowed  Intimate Partner Violence: Not At Risk (03/09/2022)   Humiliation, Afraid, Rape, and Kick questionnaire    Fear of Current or Ex-Partner: No    Emotionally Abused: No    Physically Abused: No    Sexually Abused: No   Allergies  Allergen Reactions   Januvia [Sitagliptin] Other (See Comments)    Elevated lipase   Metformin And Related Diarrhea      Review of Systems  Constitutional:  Positive for malaise/fatigue. Negative for chills and fever.  Respiratory:  Negative for shortness of breath.   Cardiovascular:  Negative for chest pain.  Gastrointestinal:  Negative for blood in stool.  Genitourinary:  Negative for hematuria.  Neurological:  Positive for dizziness. Negative for headaches.       Off and on  Psychiatric/Behavioral:  The patient is nervous/anxious.       Objective:     BP 110/60 (BP Location: Left Arm, Patient Position: Sitting)   Pulse 65   Temp 98.1 F (36.7 C) (Skin)   Ht 5\' 11"  (1.803 m)   Wt 213 lb 4 oz (96.7 kg)   SpO2 98%   BMI 29.74 kg/m  BP Readings from Last 3 Encounters:  07/05/22 110/60  06/23/22 (!) 92/58  06/21/22 96/68   Wt Readings from Last 3 Encounters:  07/05/22 213 lb 4 oz (96.7 kg)  06/22/22 212 lb (96.2 kg)  06/21/22 212 lb 1.6 oz (96.2 kg)      Physical Exam Vitals and nursing note reviewed.  Constitutional:      Appearance: Normal appearance.  Cardiovascular:     Rate and Rhythm: Normal rate and regular rhythm.     Pulses: Normal pulses.     Heart sounds: Normal heart sounds.  Pulmonary:     Effort: Pulmonary effort is normal.     Breath sounds: Normal breath  sounds.  Skin:    General: Skin is warm.  Neurological:     Mental Status: He is alert and oriented to person, place, and time.      No results found for any visits on 07/05/22.     The ASCVD Risk score (Arnett DK, et al., 2019) failed to calculate for the following reasons:   The patient has a prior MI or stroke diagnosis    Assessment & Plan:   Problem List Items Addressed This Visit       Cardiovascular and Mediastinum   Atrial fibrillation ([place]) - Primary    Followed by cardiology.   Reviewed hospital notes, labs, procedure notes.  Reviewed cardiology notes, labs.   Continue Eliquis 5 mg BID for 6 weeks and then Plavix monotherapy for 6 months. Repeat CTA in 8 weeks.   Follow up with cardiology as scheduled.  Repeat CBC pending.       Relevant Orders   CBC     Digestive   GASTROESOPHAGEAL REFLUX DISEASE    Overall controlled.   Recommended OTC pepcid as needed.        Other   Anxiety and depression    Uncontrolled. Likely situational anxiety.   Discussed treatment options with patient.   Increase sertraline to 100 mg once daily.   He will update if symptoms worsen or do not improve.      Relevant Medications   sertraline (ZOLOFT) 100 MG tablet    No follow-ups on file.    Tinnie Gens, BSN-RN, DNP STUDENT

## 2022-07-05 NOTE — Progress Notes (Signed)
Subjective:    Patient ID: KAP MILTIMORE, male    DOB: December 04, 1950, 72 y.o.   MRN: [id number]  HPI  [name] Cook is a very pleasant 72 y.o. male with a significant medical history including atrial flutter, CAD with myocardial infarction, pulmonary embolism, OSA, COPD, type 2 diabetes, CKD, anxiety and depression who presents today for hospital follow up.  Admitted to [facility] hospital on 06/22/22 for LAAO closure with Watchman for chronic atrial flutter/fibrillation with recurrent GI bleeding on anticoagulation.    He underwent left atiral appendage occlusion devise placement with 31 mm Watchman FLX device through the left groin. He tolerated the procedure well and there were no complications.   He was discharged home on 06/22/22 with recommendations for 1 month cardiology follow up, continue Eliquis BID x 45 days with a plan to transition to clopidogrel as monotherapy for a 6 month duration. He is also to undergo repeat CT at 8 weeks to ensure proper seal of the Watchman device.   Today he's feeling weak and tired and is without energy. He endorses receiving "5 units of blood" because of his procedure. He denies dizziness, rectal bleeding, hematuria, palpitations, and he is sleeping well during the night.   He is pending a CT hematuria work up this Friday.   He does mention symptoms of increased anxiety which includes mood swings and irritability. He is compliant to sertraline 50 mg daily. Previously managed on duloxetine which caused side effects of sweating.   Review of Systems  Constitutional:  Positive for fatigue. Negative for fever.  Cardiovascular:  Negative for chest pain and palpitations.  Gastrointestinal:  Negative for blood in stool.  Genitourinary:  Negative for hematuria.  Neurological:  Negative for dizziness and headaches.  Psychiatric/Behavioral:  The patient is nervous/anxious.          Past Medical History:  Diagnosis Date   Acute bronchitis with COPD ([place])  03/07/2021   Arthritis    "knees and lower back" (03/14/2013)   Atrial flutter ([place])    radiofrequency ablation in 2001   CAD (coronary artery disease)    a. Nonobstructive. Cardiac cath in 2001-50% mid RI, normal LM, LAD, RCA b. cath 10/16/2014 95% mid RCA treated with DES, 99% ost D1 medical management due to small aneurysmal segment   Chronic anticoagulation    chronic Coumadin anticoagulation   Chronic obstructive pulmonary disease ([place]) 04/20/2011   Diabetes mellitus, type 2 (HCC)    Elevated lipase 06/23/2021   GERD (gastroesophageal reflux disease)    Hyperlipidemia    Hypertension    with hypertensive heart disease   Left knee pain 10/25/2017   medial   Obesity    Persistent atrial fibrillation ([place])    recurrent atrial flutter since 2001 s/p DCCVs, multiple failed AADs, h/o tachy-mediated cardiomyopathy   Presence of Watchman left atrial appendage closure device 06/22/2022   Watchman  49mm FLX placed with Dr. Burt Knack   Shortness of breath    "can come on at any time" (03/14/2013)   Sleep apnea    "dx'd; couldn't wear the mask" (03/14/2013)   Symptomatic anemia 03/08/2022   Tobacco abuse     Social History   Socioeconomic History   Marital status: Widowed    Spouse name: Not on file   Number of children: 1   Years of education: Not on file   Highest education level: Not on file  Occupational History   Occupation: Unemployed    Employer: UNEMPLOYED  Tobacco Use  Smoking status: Former    Packs/day: 1.00    Years: 42.00    Additional pack years: 0.00    Total pack years: 42.00    Types: Cigarettes    Quit date: 12/30/2013    Years since quitting: 8.5   Smokeless tobacco: Never   Tobacco comments:    Former smoker 03/20/22  Vaping Use   Vaping Use: Never used  Substance and Sexual Activity   Alcohol use: Not Currently    Comment: 03/14/2013 "stopped drinking back in 2002; never had problem w/it"   Drug use: No   Sexual activity: Not Currently  Other  Topics Concern   Not on file  Social History Narrative   Single.   Retired.    1 son, deceased.    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   Enjoys playing on the computer.       Social Determinants of Health   Financial Resource Strain: Low Risk  (10/07/2021)   Overall Financial Resource Strain (CARDIA)    Difficulty of Paying Living Expenses: Not hard at all  Food Insecurity: No Food Insecurity (06/26/2022)   Hunger Vital Sign    Worried About Running Out of Food in the Last Year: Never true    Ran Out of Food in the Last Year: Never true  Transportation Needs: No Transportation Needs (06/26/2022)   PRAPARE - Hydrologist (Medical): No    Lack of Transportation (Non-Medical): No  Physical Activity: Unknown (10/07/2021)   Exercise Vital Sign    Days of Exercise per Week: 3 days    Minutes of Exercise per Session: Not on file  Stress: No Stress Concern Present (10/07/2021)   [place]    Feeling of Stress : Not at all  Social Connections: Socially Isolated (10/07/2021)   Social Connection and Isolation Panel [NHANES]    Frequency of Communication with Friends and Family: Once a week    Frequency of Social Gatherings with Friends and Family: Once a week    Attends Religious Services: Never    Marine scientist or Organizations: No    Attends Archivist Meetings: Never    Marital Status: Widowed  Intimate Partner Violence: Not At Risk (03/09/2022)   Humiliation, Afraid, Rape, and Kick questionnaire    Fear of Current or Ex-Partner: No    Emotionally Abused: No    Physically Abused: No    Sexually Abused: No    Past Surgical History:  Procedure Laterality Date   ATRIAL FLUTTER ABLATION  2002   atrial flutter; subsequently developed atrial fibrillation   AV NODE ABLATION  01/24/2013   CARDIAC CATHETERIZATION  2002   CARDIAC  CATHETERIZATION N/A 10/16/2014   Procedure: Left Heart Cath and Coronary Angiography;  Surgeon: [name], MD;  Location: [place] [facility];  Service: Cardiovascular;  Laterality: N/A;   CARDIOVERSION  05/31/2011   Procedure: CARDIOVERSION;  Surgeon: Cristopher Estimable. Lattie Haw, MD;  Location: [facility];  Service: Cardiovascular;  Laterality: N/A;   CARPAL TUNNEL RELEASE Left 1980's   COLONOSCOPY WITH PROPOFOL N/A 12/30/2018   Procedure: COLONOSCOPY WITH PROPOFOL;  Surgeon: Jonathon Bellows, MD;  Location: [facility] ENDOSCOPY;  Service: Gastroenterology;  Laterality: N/A;   COLONOSCOPY WITH PROPOFOL N/A 12/04/2019   Procedure: COLONOSCOPY WITH PROPOFOL;  Surgeon: Jonathon Bellows, MD;  Location: [facility] ENDOSCOPY;  Service: Gastroenterology;  Laterality: N/A;   COLONOSCOPY WITH PROPOFOL N/A 06/13/2021  Procedure: COLONOSCOPY WITH PROPOFOL;  Surgeon: Jonathon Bellows, MD;  Location: [facility] ENDOSCOPY;  Service: Gastroenterology;  Laterality: N/A;   ESOPHAGOGASTRODUODENOSCOPY N/A 03/01/2022   Procedure: ESOPHAGOGASTRODUODENOSCOPY (EGD);  Surgeon: Clarene Essex, MD;  Location: [facility];  Service: Gastroenterology;  Laterality: N/A;   GIVENS CAPSULE STUDY Left 03/10/2022   Procedure: GIVENS CAPSULE STUDY;  Surgeon: Arta Silence, MD;  Location: [facility] ENDOSCOPY;  Service: Gastroenterology;  Laterality: Left;   INSERT / REPLACE / REMOVE PACEMAKER  01/24/2013    Medtronic Adapta L dual-chamber pacemaker, serial number [id number]    LEFT ATRIAL APPENDAGE OCCLUSION N/A 06/22/2022   Procedure: LEFT ATRIAL APPENDAGE OCCLUSION;  Surgeon: [name], MD;  Location: [place] [facility];  Service: Cardiovascular;  Laterality: N/A;   LEFT HEART CATH AND CORONARY ANGIOGRAPHY N/A 06/07/2018   Procedure: LEFT HEART CATH AND CORONARY ANGIOGRAPHY;  Surgeon: [name], MD;  Location: [place] [facility];  Service: Cardiovascular;  Laterality: N/A;   LEFT HEART CATHETERIZATION WITH CORONARY ANGIOGRAM N/A 03/17/2013   Procedure: LEFT HEART  CATHETERIZATION WITH CORONARY ANGIOGRAM;  Surgeon: Burnell Blanks, MD; LAD mild dz, D1 branch 100%, inferior branch 99%, CFX OK, RCA 50%, EF 65%     LOOP RECORDER IMPLANT  2002   PERMANENT PACEMAKER INSERTION N/A 01/24/2013   Procedure: PERMANENT PACEMAKER INSERTION;  Surgeon: [name], MD;  Location: [facility] CATH LAB;  Service: Cardiovascular;  Laterality: N/A;   TEE WITHOUT CARDIOVERSION N/A 06/22/2022   Procedure: TRANSESOPHAGEAL ECHOCARDIOGRAM;  Surgeon: [name], MD;  Location: [place] [facility];  Service: Cardiovascular;  Laterality: N/A;   TIBIAL TUBERCLERPLASTY  ~ 2003    Family History  Problem Relation Age of Onset   Alzheimer's disease Mother    Osteoporosis Mother     Allergies  Allergen Reactions   Januvia [Sitagliptin] Other (See Comments)    Elevated lipase   Metformin And Related Diarrhea    Current Outpatient Medications on File Prior to Visit  Medication Sig Dispense Refill   albuterol (VENTOLIN HFA) 108 (90 Base) MCG/ACT inhaler INHALE 2 PUFFS BY MOUTH EVERY 4 HOURS AS NEEDED FOR WHEEZE OR FOR SHORTNESS OF BREATH 8.5 each 0   apixaban (ELIQUIS) 5 MG TABS tablet Take 1 tablet (5 mg total) by mouth 2 (two) times daily. 180 tablet 0   Ascorbic Acid (VITAMIN C) 1000 MG tablet Take 2,000 mg by mouth daily.     blood glucose meter kit and supplies KIT Dispense based on patient and insurance preference. Use up to four times daily as directed. (FOR ICD-9 250.00, 250.01). 1 each 0   budesonide-formoterol (SYMBICORT) 160-4.5 MCG/ACT inhaler INHALE 2 PUFFS INTO THE LUNGS TWICE A DAY (Patient taking differently: Inhale 2 puffs into the lungs daily as needed (for shortness of breath and wheezing).) 10.2 each 5   diphenhydrAMINE (BENADRYL) 25 MG tablet Take 50 mg by mouth at bedtime as needed for itching.     fluticasone (FLONASE) 50 MCG/ACT nasal spray PLACE 1 SPRAY INTO BOTH NOSTRILS DAILY AS NEEDED FOR ALLERGIES OR RHINITIS. 48 mL 3   gabapentin (NEURONTIN) 300  MG capsule TAKE 1 CAPSULE BY MOUTH EVERY MORNING AND 2 CAPSULES BY MOUTH AT NIGHT FOR NEUROPATHY 270 capsule 3   glipiZIDE (GLUCOTROL XL) 10 MG 24 hr tablet TAKE 1 TABLET (10 MG TOTAL) BY MOUTH DAILY WITH BREAKFAST. FOR DIABETES. 90 tablet 1   glucose blood test strip 1 each by Other route as needed for other. Use to check blood sugar up to three  times a day-Accu-Chek meter     isosorbide mononitrate (IMDUR) 30 MG 24 hr tablet Take 1 tablet (30 mg total) by mouth daily. 90 tablet 1   JARDIANCE 25 MG TABS tablet TAKE 1 TABLET (25 MG TOTAL) BY MOUTH DAILY BEFORE BREAKFAST. FOR DIABETES. 90 tablet 1   lisinopril (ZESTRIL) 2.5 MG tablet TAKE 1 TABLET BY MOUTH EVERY DAY 90 tablet 3   nitroGLYCERIN (NITROSTAT) 0.4 MG SL tablet Place 1 tablet (0.4 mg total) under the tongue every 5 (five) minutes as needed for chest pain. Do not exceed 3 tablets in 24 hours. 25 tablet 0   propranolol ER (INDERAL LA) 80 MG 24 hr capsule Take 1 capsule (80 mg total) by mouth at bedtime. For tremors 90 capsule 0   rosuvastatin (CRESTOR) 20 MG tablet TAKE 1 TABLET BY MOUTH EVERY DAY IN THE EVENING FOR CHOLESTEROL 90 tablet 3   tamsulosin (FLOMAX) 0.4 MG CAPS capsule TAKE 1 CAPSULE (0.4 MG TOTAL) BY MOUTH 2 (TWO) TIMES DAILY. (Patient taking differently: Take 0.4 mg by mouth at bedtime.) 180 capsule 3   TYLENOL 500 MG tablet Take 1,000 mg by mouth daily.     pantoprazole (PROTONIX) 40 MG tablet Take 1 tablet (40 mg total) by mouth daily. (Patient not taking: Reported on 07/05/2022) 30 tablet 2   No current facility-administered medications on file prior to visit.    BP 110/60 (BP Location: Left Arm, Patient Position: Sitting)   Pulse 65   Temp 98.1 F (36.7 C) (Skin)   Ht 5\' 11"  (1.803 m)   Wt 213 lb 4 oz (96.7 kg)   SpO2 98%   BMI 29.74 kg/m  Objective:   Physical Exam Cardiovascular:     Rate and Rhythm: Normal rate and regular rhythm.  Pulmonary:     Effort: Pulmonary effort is normal.     Breath sounds: Normal  breath sounds. No wheezing or rales.  Musculoskeletal:     Cervical back: Neck supple.  Skin:    General: Skin is warm and dry.  Neurological:     Mental Status: He is alert and oriented to person, place, and time.           Assessment & Plan:  Chronic atrial fibrillation [facility]) Assessment & Plan: Followed by cardiology.   Reviewed hospital notes, labs, procedure notes.  Reviewed cardiology notes, labs.   Continue Eliquis 5 mg BID for 6 weeks and then Plavix monotherapy for 6 months. Repeat CTA in 8 weeks.   Follow up with cardiology as scheduled.  Repeat CBC pending.   I evaluated patient, was consulted regarding treatment, and agree with assessment and plan per Tinnie Gens, RN, DNP student.   Allie Bossier, NP-C   Orders: -     CBC  Anxiety and depression Assessment & Plan: Uncontrolled. Likely situational anxiety given recent cardiac procedure.   Discussed treatment options with patient.   Increase sertraline to 100 mg once daily.   He will update if symptoms worsen or do not improve.  I evaluated patient, was consulted regarding treatment, and agree with assessment and plan per Tinnie Gens, RN, DNP student.   Allie Bossier, NP-C   Orders: -     Sertraline HCl; Take 1 tablet (100 mg total) by mouth daily. For anxiety and depression.  Dispense: 90 tablet; Refill: 0  Gastroesophageal reflux disease, unspecified whether esophagitis present Assessment & Plan: Overall controlled.   Recommended OTC pepcid as needed.  I evaluated patient, was consulted regarding treatment, and  agree with assessment and plan per Tinnie Gens, RN, DNP student.   Allie Bossier, NP-C'          [name], NP

## 2022-07-06 ENCOUNTER — Ambulatory Visit: Payer: 59 | Admitting: Pharmacist

## 2022-07-06 LAB — CBC
HCT: 35.8 % — ABNORMAL LOW (ref 39.0–52.0)
Hemoglobin: 11.3 g/dL — ABNORMAL LOW (ref 13.0–17.0)
MCHC: 31.7 g/dL (ref 30.0–36.0)
MCV: 87.3 fl (ref 78.0–100.0)
Platelets: 163 10*3/uL (ref 150.0–400.0)
RBC: 4.09 Mil/uL — ABNORMAL LOW (ref 4.22–5.81)
RDW: 21.5 % — ABNORMAL HIGH (ref 11.5–15.5)
WBC: 5.2 10*3/uL (ref 4.0–10.5)

## 2022-07-06 NOTE — Patient Instructions (Signed)
Visit Information  Phone number for Pharmacist: [phone]  Thank you for meeting with me to discuss your medications! Below is a summary of what we talked about during the visit:   Recommendations/Changes made from today's visit: -Elizebeth Koller out current Eliquis Rx (set to end 09/06/22) to complete 6 months of anticoagulation following PE in Nov 2023; plan to switch to clopidogrel monotherapy after that (s/p Watchman) -Advised pt to continue monitoring BP; contact provider with BP < 100/60 or < 110/60 with symptoms -Check A1c at next OV  Follow up plan: [facility] RN to call patient 1 month for BP update -Pharmacist follow up televisit scheduled for 3 month -Uro CT 07/07/22; Cardiology appt 07/24/22; Cardiac CT 08/10/22; Oncology appt 08/21/22;  PCP appt 10/27/22 (CPE)   Charlene Brooke, PharmD, BCACP Clinical Pharmacist [facility] [phone]

## 2022-07-06 NOTE — Progress Notes (Signed)
Care Management & Coordination Services Pharmacy Note  07/06/2022 Name:  [name] Cook MRN:  [id number] DOB:  [birth year]  Summary: F/U visit -HTN: pt reports improvement with hypotensive issues - he is not checking BP at home but reports dizziness has improved; he has felt weaker since Watchman procedure 3/7 -Afib: s/p Watchman, plan to continue Eliquis for 45 days following procedure then switch to plavix monotherapy. Of note also has PE 03/08/22 with planned anticoagulation x 6 months (complete late May 2024). Current Rx for Eliquis will run out 09/06/22 which will complete 6 months post PE -DM: A1c 5.8% (04/28/22) at goal, though query accuracy of A1c in setting of recent blood loss and transfusion (1 u PRBC 04/07/22, 4 u PRBC 03/13/22); pt reports fasting BG 130s, he denies BG < 100 or > 200   Recommendations/Changes made from today's visit: -Finish out current Eliquis Rx (set to end 09/06/22) to complete 6 months of anticoagulation following PE in Nov 2023; plan to switch to clopidogrel monotherapy after that (s/p Watchman) -Advised pt to continue monitoring BP; contact provider with BP < 100/60 or < 110/60 with symptoms -Check A1c at next OV  Follow up plan: [facility] RN to call patient 1 month for BP update -Pharmacist follow up televisit scheduled for 3 month -Uro CT 07/07/22; Cardiology appt 07/24/22; Cardiac CT 08/10/22; Oncology appt 08/21/22;  PCP appt 10/27/22 (CPE)    Subjective: [name] Cook is an 72 y.o. year old male who is a primary patient of [name] Koch, NP.  The care coordination team was consulted for assistance with disease management and care coordination needs.    Engaged with patient by telephone for follow up visit.  Recent office visits: 07/05/22 NP Allie Bossier OV: hospital f/u - situational anxiety. Increase sertraline to 100 mg. Try pepcid for indigestion. Ordered CBC  05/02/22 TE - ok to start propranolol per cardiology. Rx propranolol ER 80 mg HS.  04/28/22 NP Allie Bossier OV: f/u - tremor. Consider propranolol, check with cardiology first.  03/22/22 NP Allie Bossier OV: hospital f/u  - start pantoprazole.   Recent consult visits: 06/21/22 PA McGowan (Urology): f/u hematuria. Rec CT urogram.   06/16/22 NP Glenford Peers (Cardiology): f/u Afib - no changes.   05/26/22 NP Toy Care (FM): Hematuria, UTI - rx Bactrim.  05/01/22 Dr Burt Knack (Cardiology): Afib - requires 6 months AC from PE 02/2022. Continue Eliquis. Start ivabradine 10 mg BID  03/20/22 PA Clint Fenton (Afib clinic): refer to [facility] team. Continue Eliquis.   03/15/22 PA Ermalinda Barrios (Cardiology): f/u - d/c clopdiogrel (now on Eliquis, recent GIB). Weak/dizzy - d/c lasix and reduce imdur to 30 mg  Hospital visits: 06/22/22 planned admission [facility]): Watchman procedure. Continue Eliquis x 45 days, then transition to plavix monotherapy x 6 months.   04/24/21 ED visit [facility]): SOB - HGB 10. F/u PCP.  04/07/22 ED visit [facility]): weakness, anemia. HGB 7.4. Given 1 u PRBC.   03/28/22 ED visit [facility]): dizzines, SOB. Orthostatic in ED (120/70 to 90/60). Given IV fluids. Hold Imdur. F/u PCP.  03/08/22 - 03/12/22 Admission ([facility]): GI Bleed, Acute PE/DVT. Transition to Eliquis. Hold Plavix.   02/27/22 - 03/03/22 Admission ([facility]): Upper GI bleed, on warfarin and Plavix. HGB 9.7 on admission down from 15. Received 4 units PRBC. Changed to Pantoprazole 40 mg BID. Resumed Plavix but hold Coumadin until clinic f/u. HGB 8.1 at discharge.   Objective:  Lab Results  Component Value Date   CREATININE 1.17 06/23/2022   BUN 30 (  H) 06/23/2022   GFR 58.02 (L) 04/28/2022   EGFR 75 06/16/2022   GFRNONAA >60 06/23/2022   GFRAA 72 06/23/2019   NA 136 06/23/2022   K 4.1 06/23/2022   CALCIUM 8.4 (L) 06/23/2022   CO2 22 06/23/2022   GLUCOSE 206 (H) 06/23/2022    Lab Results  Component Value Date/Time   HGBA1C 5.8 (A) 04/28/2022 11:06 AM   HGBA1C 6.4 (H) 03/08/2022 06:00 PM   HGBA1C 7.5 (H) 10/26/2021 12:43 PM   HGBA1C 6.4 (H)  11/27/2016 11:33 AM   HGBA1C 7.1 (H) 08/21/2016 10:25 AM   GFR 58.02 (L) 04/28/2022 11:31 AM   GFR 71.64 10/26/2021 12:43 PM   MICROALBUR 1.1 05/01/2022 12:45 PM   MICROALBUR <0.7 09/21/2017 01:06 PM    Last diabetic Eye exam:  Lab Results  Component Value Date/Time   HMDIABEYEEXA No Retinopathy 03/30/2022 12:00 AM    Last diabetic Foot exam: No results found for: "HMDIABFOOTEX"   Lab Results  Component Value Date   CHOL 132 10/26/2021   HDL 50.10 10/26/2021   LDLCALC 49 10/26/2021   TRIG 166.0 (H) 10/26/2021   CHOLHDL 3 10/26/2021       Latest Ref Rng & Units 04/07/2022    8:14 PM 03/28/2022    2:33 PM 03/09/2022    1:03 AM  Hepatic Function  Total Protein 6.5 - 8.1 g/dL 5.1  6.0  5.4   Albumin 3.5 - 5.0 g/dL 3.5  3.6  3.3   AST 15 - 41 U/L 17  17  13    ALT 0 - 44 U/L 10  14  16    Alk Phosphatase 38 - 126 U/L 42  42  40   Total Bilirubin 0.3 - 1.2 mg/dL 0.3  0.5  0.6   Bilirubin, Direct 0.0 - 0.2 mg/dL  <0.1      Lab Results  Component Value Date/Time   TSH 1.17 04/28/2022 11:31 AM   TSH 2.030 04/15/2019 10:05 AM   FREET4 0.88 04/28/2022 11:31 AM   FREET4 1.38 03/14/2013 11:12 AM       Latest Ref Rng & Units 07/05/2022    4:07 PM 06/23/2022   12:24 AM 06/16/2022   11:59 AM  CBC  WBC 4.0 - 10.5 K/uL 5.2  4.7  4.1   Hemoglobin 13.0 - 17.0 g/dL 11.3  9.4  11.0   Hematocrit 39.0 - 52.0 % 35.8  31.2  37.2   Platelets 150.0 - 400.0 K/uL 163.0  141  153     Lab Results  Component Value Date/Time   VD25OH 32 10/27/2015 10:54 AM    Clinical ASCVD: Yes  The ASCVD Risk score (Arnett DK, et al., 2019) failed to calculate for the following reasons:   The patient has a prior MI or stroke diagnosis    CHA2DS2/VAS Stroke Risk Points  Current as of yesterday     5 >= 2 Points: High Risk  1 - 1.99 Points: Medium Risk  0 Points: Low Risk    Last Change: N/A        07/05/2022    3:15 PM 05/26/2022    3:52 PM 04/28/2022   10:47 AM  Depression screen PHQ 2/9   Decreased Interest 0 0 0  Down, Depressed, Hopeless 0 0 0  PHQ - 2 Score 0 0 0  Altered sleeping 0 0   Tired, decreased energy 1 0   Change in appetite 0 0   Feeling bad or failure about yourself  0 0  Trouble concentrating 0 0   Moving slowly or fidgety/restless 0 0   Suicidal thoughts 0 0   PHQ-9 Score 1 0   Difficult doing work/chores Not difficult at all Not difficult at all      Social History   Tobacco Use  Smoking Status Former   Packs/day: 1.00   Years: 42.00   Additional pack years: 0.00   Total pack years: 42.00   Types: Cigarettes   Quit date: 12/30/2013   Years since quitting: 8.5  Smokeless Tobacco Never  Tobacco Comments   Former smoker 03/20/22   BP Readings from Last 3 Encounters:  07/05/22 110/60  06/23/22 (!) 92/58  06/21/22 96/68   Pulse Readings from Last 3 Encounters:  07/05/22 65  06/23/22 80  06/21/22 88   Wt Readings from Last 3 Encounters:  07/05/22 213 lb 4 oz (96.7 kg)  06/22/22 212 lb (96.2 kg)  06/21/22 212 lb 1.6 oz (96.2 kg)   BMI Readings from Last 3 Encounters:  07/05/22 29.74 kg/m  06/22/22 29.57 kg/m  06/21/22 29.58 kg/m    Allergies  Allergen Reactions   Januvia [Sitagliptin] Other (See Comments)    Elevated lipase   Metformin And Related Diarrhea    Medications Reviewed Today     Reviewed by Charlton Haws, [facility] (Pharmacist) on 07/06/22 at 50  Med List Status: <None>   Medication Order Taking? Sig Documenting Provider Last Dose Status Informant  albuterol (VENTOLIN HFA) 108 (90 Base) MCG/ACT inhaler [id number] Yes INHALE 2 PUFFS BY MOUTH EVERY 4 HOURS AS NEEDED FOR WHEEZE OR FOR SHORTNESS OF BREATH [name] Koch, NP Taking Active Spouse/Significant Other  apixaban (ELIQUIS) 5 MG TABS tablet [id number] Yes Take 1 tablet (5 mg total) by mouth 2 (two) times daily. [name] Koch, NP Taking Active Spouse/Significant Other  Ascorbic Acid (VITAMIN C) 1000 MG tablet [id number] Yes Take 2,000 mg by mouth  daily. [provider] Taking Active Spouse/Significant Other  blood glucose meter kit and supplies KIT [id number] Yes Dispense based on patient and insurance preference. Use up to four times daily as directed. (FOR ICD-9 250.00, 250.01). [name] Koch, NP Taking Active Spouse/Significant Other  budesonide-formoterol [facility]) 160-4.5 MCG/ACT inhaler [id number] Yes INHALE 2 PUFFS INTO THE LUNGS TWICE A DAY  Patient taking differently: Inhale 2 puffs into the lungs daily as needed (for shortness of breath and wheezing).   [name] Koch, NP Taking Active Spouse/Significant Other  diphenhydrAMINE (BENADRYL) 25 MG tablet [id number] Yes Take 50 mg by mouth at bedtime as needed for itching. [provider] Taking Active Spouse/Significant Other  fluticasone (FLONASE) 50 MCG/ACT nasal spray [id number] Yes PLACE 1 SPRAY INTO BOTH NOSTRILS DAILY AS NEEDED FOR ALLERGIES OR RHINITIS. [name] Koch, NP Taking Active   gabapentin (NEURONTIN) 300 MG capsule [id number] Yes TAKE 1 CAPSULE BY MOUTH EVERY MORNING AND 2 CAPSULES BY MOUTH AT NIGHT FOR NEUROPATHY [name] Koch, NP Taking Active Spouse/Significant Other  glipiZIDE (GLUCOTROL XL) 10 MG 24 hr tablet [id number] Yes TAKE 1 TABLET (10 MG TOTAL) BY MOUTH DAILY WITH BREAKFAST. FOR DIABETES. [name] Koch, NP Taking Active Spouse/Significant Other  glucose blood test strip [id number] Yes 1 each by Other route as needed for other. Use to check blood sugar up to three times a day-Accu-Chek meter [provider] Taking Active Spouse/Significant Other  isosorbide mononitrate (IMDUR) 30 MG 24 hr tablet [id number] Yes Take 1 tablet (30 mg total) by mouth daily. Imogene Burn, PA-C Taking Active Spouse/Significant  Other  JARDIANCE 25 MG TABS tablet [id number] Yes TAKE 1 TABLET (25 MG TOTAL) BY MOUTH DAILY BEFORE BREAKFAST. FOR DIABETES. [name] Koch, NP Taking Active Spouse/Significant Other  lisinopril (ZESTRIL) 2.5 MG  tablet [id number] Yes TAKE 1 TABLET BY MOUTH EVERY DAY [name], MD Taking Active   nitroGLYCERIN (NITROSTAT) 0.4 MG SL tablet [id number] Yes Place 1 tablet (0.4 mg total) under the tongue every 5 (five) minutes as needed for chest pain. Do not exceed 3 tablets in 24 hours. [name] Koch, NP Taking Active Spouse/Significant Other  pantoprazole (PROTONIX) 40 MG tablet [id number]  Take 1 tablet (40 mg total) by mouth daily.  Patient not taking: Reported on 07/05/2022   Geradine Girt, DO  Expired 06/19/22 2359 Spouse/Significant Other  propranolol ER (INDERAL LA) 80 MG 24 hr capsule [id number] Yes Take 1 capsule (80 mg total) by mouth at bedtime. For tremors [name] Koch, NP Taking Active Spouse/Significant Other  rosuvastatin (CRESTOR) 20 MG tablet [id number] Yes TAKE 1 TABLET BY MOUTH EVERY DAY IN THE EVENING FOR CHOLESTEROL [name] Koch, NP Taking Active Spouse/Significant Other  sertraline (ZOLOFT) 100 MG tablet [id number] Yes Take 1 tablet (100 mg total) by mouth daily. For anxiety and depression. [name] Koch, NP Taking Active   tamsulosin (FLOMAX) 0.4 MG CAPS capsule [id number] No TAKE 1 CAPSULE (0.4 MG TOTAL) BY MOUTH 2 (TWO) TIMES DAILY.  Patient not taking: Reported on 07/06/2022   Noreene Filbert, MD Not Taking Active Spouse/Significant Other           Med Note Rolan Bucco Jul 06, 2022 11:35 AM) Last refilled 04/2021  TYLENOL 500 MG tablet [id number] Yes Take 1,000 mg by mouth daily. [provider] Taking Active Spouse/Significant Other            SDOH:  (Social Determinants of Health) assessments and interventions performed: No SDOH Interventions    Flowsheet Row Telephone from 06/26/2022 in [place] Telephone from 05/17/2022 in [place] Telephone from 01/11/2022 in [place] from 10/07/2021 in  [place] at [facility] Visit from 04/08/2021 in [place] [facility] from 12/07/2020 in [place] [facility] Interventions Intervention Not Indicated Intervention Not Indicated Intervention Not Indicated Intervention Not Indicated -- --  Housing Interventions Intervention Not Indicated Intervention Not Indicated Intervention Not Indicated Intervention Not Indicated -- --  Transportation Interventions Intervention Not Indicated Intervention Not Indicated Intervention Not Indicated Intervention Not Indicated -- --  Depression Interventions/Treatment  -- -- -- -- Counseling --  Financial Strain Interventions -- -- -- Intervention Not Indicated -- Intervention Not Indicated  Physical Activity Interventions -- -- -- Intervention Not Indicated -- --  Stress Interventions -- -- -- Intervention Not Indicated -- --  Social Connections Interventions -- -- -- Intervention Not Indicated -- --       Medication Assistance: None required.  Patient affirms current coverage meets needs. [facility] Dual complete)  Medication Access: Within the past 30 days, how often has patient missed a dose of medication? 0 Is a pillbox or other method used to improve adherence? Yes  Factors that may affect medication adherence? no barriers identified Are meds synced by current pharmacy? No  Are meds delivered by current pharmacy? No  Does patient experience delays  in picking up medications due to transportation concerns? No   Upstream Services Reviewed: Is patient disadvantaged to use [facility]?: No  Current Rx insurance plan: U[employer] dual complete Name and location of Current pharmacy:  [id number] [place], [address] [place] [address] Phone: [phone] Fax: [place] [address] Phone: [phone] Fax: [phone]  [facility] Pharm[facility] services reviewed with patient today?: No  Patient requests to transfer care to [facility] Pharmacy?: No  Reason patient declined to change pharmacies: Not mentioned at this visit  Compliance/Adherence/Medication fill history: Care Gaps: None  Star-Rating Drugs: Glipizide - PDC 100% Jardiance - PDC 99% Lisinopril - PDC 100% Rosuvastatin - PDC 100%   ASSESSMENT / PLAN  Hypertension / LVH (BP goal <130/80) -Controlled - pt is not checking BP at home; previously had many issues with hypotension/anemia leading to ED visits, but pt reports he feels much more stable now; he reports significant improvement in tremor with propranolol -LVEF 50% (02/2022); mild Left ventricular hypertrophy -Hx OSA, unable to tolerate CPAP -Current treatment: Lisinopril 2.5 mg daily -Appropriate, Effective, Safe, Accessible Isosorbide MN 30 mg daily - Appropriate, Effective, Query Safe Propranolol ER 80 mg daily HS (tremor) -Appropriate, Effective, Safe, Accessible Jardiance 25 mg daily - Appropriate, Effective, Safe, Accessible -Medications previously tried: furosemide -Educated on BP goals and benefits of medications for prevention of heart attack, stroke and kidney damage; -Counseled to monitor BP at home daily; contact provider with BP <100/60 or < 110/60 with symptoms -Recommended to continue current medication   Atrial Fibrillation (Goal: prevent stroke and major bleeding) -Controlled - s/p Watchman procedure 3/7, plan to continue Eliquis for 45 days  -Followed by cardiology; pursuing Watchman procedure March 2024 -CHADSVASC: 5 -Current treatment: Eliquis 5 mg BID - Appropriate, Effective, Safe, Accessible Propranolol ER 80 mg daily HS (tremor) -Appropriate, Effective, Query Safe -Medications previously tried: metoprolol; warfarin (GI bleed x 2) -Counseled on importance of adherence to anticoagulant exactly  as prescribed; avoidance of NSAIDs due to increased bleeding risk with anticoagulants; -Discussed planned Eliquis length of therapy following Watchman procedure -Recommended to continue current medication  Acute PE/DVT (Goal: prevent recurrence) -Stable -GI bleed 02/27/22 on warfarin/Plavix > held warfarin. Readmitted 11/22 with GI bleed and PE > start Eliquis, hold Plavix. -Current treatment  Eliquis 5 mg BID -Appropriate, Effective, Safe, Accessible -Medications previously tried: warfarin -Discussed Eliquis cost - he reports copay was $0; he has Medicare/Medicaid so cost should not be an issue going forward -Recommended to continue current medication  Diabetes (A1c goal <7%) -Query controlled - A1c 5.8% (04/28/22) at goal, though query accuracy of A1c in setting of recent blood loss and transfusion (1 u PRBC 04/07/22, 4 u PRBC 03/13/22) -Current home glucose readings - checks fasting 3 days per week fasting glucose: 136; nothing < 100 -Current medications: Jardiance 25 mg daily - Appropriate, Effective, Safe, Accessible Glipizide XL 10 mg daily - Appropriate, Effective, Safe, Accessible -Medications previously tried: metformin (diarrhea), Januvia (elevated lipase) -Reviewed benefits of medications and fasting BG goal < 130 -Discussed A1c is unreliable following blood transfusion, recommend to wait 3 months after last transfusion to re-check A1c for best results -Recommend to continue current medication  Hyperlipidemia/CAD: (LDL goal < 70) -Controlled - LDL 49 (10/2021) at goal; TRIG 166 (10/2021) slightly elevated -Hx CAD, stent Feb 2020, followed by cardiology -Pt denies any chest pain or abnormal symptoms. Affirms daily adherence.  -Current  treatment: Rosuvastatin 20 mg daily - Appropriate, Effective, Safe, Accessible Isosorbide MN 30 mg daily -.Appropriate, Effective, Query Safe Nitroglycerin - PRN  -Appropriate, Effective, Safe, Accessible -Medications previously tried: fish  oil -Educated on Cholesterol goals;  -Recommend to continue current medication  COPD/OSA (Goal: control symptoms and prevent exacerbations) -Controlled -  per patient report, no issues with breathing/wheezing -No active tobacco use. Quit 2013. Low dose CT 05/06/21 normal.  -No PFTs found. No CAT score documented. -Current treatment  Symbicort 160-4.5 mcg/act - 2 puff BID (takes PRN) - Appropriate, Effective, Safe, Accessible Albuterol PRN - Appropriate, Effective, Safe, Accessible -Medications previously tried: none reported -Exacerbations requiring treatment in last 6 months: none -Patient denies consistent use of maintenance inhaler. He uses Symbicort PRN -Frequency of rescue inhaler use: ~ 1-2 x/week -Counseled on Differences between maintenance and rescue inhalers -Recommended to continue current medication for now.  Depression/Insomnia (Goal: Improve mood, sleep) -Query Controlled - pt cites increased anxiety, sertraline was increased yesterday per PCP -Caffeine: 2 cups coffee, drinks coke zero - 2 cans/day. No alcohol or tobacco use -PHQ9= 2 (03/2021) - no/minimal depression -Current treatment: Sertraline 100 mg daily - Appropriate, Query Effective -Medications previously tried/failed: Lexapro 10 mg, Bupropion 150 mg BID, citalopram, duloxetine -Discussed it may take several weeks for dose increase to show benefit -Recommended to continue current medication  Health Maintenance -Vaccine gaps: Prevnar20 or PPSV23, TDAP -Hx prostate cancer, s/p radiation and Lupron early 2020   Charlene Brooke, PharmD, BCACP Clinical Pharmacist [facility] [phone]

## 2022-07-07 ENCOUNTER — Ambulatory Visit
Admission: RE | Admit: 2022-07-07 | Discharge: 2022-07-07 | Disposition: A | Discharge status: Home / Self Care | Payer: 59 | Source: Ambulatory Visit | Attending: Urology | Admitting: Urology

## 2022-07-07 DIAGNOSIS — R310 Gross hematuria: Secondary | ICD-10-CM | POA: Diagnosis not present

## 2022-07-07 DIAGNOSIS — K5730 Diverticulosis of large intestine without perforation or abscess without bleeding: Secondary | ICD-10-CM | POA: Diagnosis not present

## 2022-07-07 DIAGNOSIS — K8020 Calculus of gallbladder without cholecystitis without obstruction: Secondary | ICD-10-CM | POA: Diagnosis not present

## 2022-07-07 DIAGNOSIS — N281 Cyst of kidney, acquired: Secondary | ICD-10-CM | POA: Diagnosis not present

## 2022-07-07 MED ORDER — IOHEXOL 300 MG/ML  SOLN
100.0000 mL | Freq: Once | INTRAMUSCULAR | Status: AC | PRN
  Administered 2022-07-07: 100 mL via INTRAVENOUS

## 2022-07-08 ENCOUNTER — Other Ambulatory Visit: Payer: Self-pay | Admitting: Internal Medicine

## 2022-07-08 ENCOUNTER — Other Ambulatory Visit: Payer: Self-pay | Admitting: Primary Care

## 2022-07-08 DIAGNOSIS — F419 Anxiety disorder, unspecified: Secondary | ICD-10-CM

## 2022-07-08 DIAGNOSIS — J439 Emphysema, unspecified: Secondary | ICD-10-CM

## 2022-07-10 ENCOUNTER — Telehealth: Payer: Self-pay | Admitting: *Deleted

## 2022-07-10 NOTE — Progress Notes (Signed)
  Care Coordination   Note   07/10/2022 Name: ADAIN PYO MRN: [id number] DOB: 09/09/1950  ZACCHEUS VALESQUEZ is a 72 y.o. year old male who sees [name], NP for primary care. I reached out to [name] by phone today to offer care coordination services.  Mr. Ferraioli was given information about Care Coordination services today including:   The Care Coordination services include support from the care team which includes your Nurse Coordinator, Clinical Social Worker, or Pharmacist.  The Care Coordination team is here to help remove barriers to the health concerns and goals most important to you. Care Coordination services are voluntary, and the patient may decline or stop services at any time by request to their care team member.   Care Coordination Consent Status: Patient agreed to services and verbal consent obtained.   Follow up plan:  Telephone appointment with care coordination team member scheduled for:  07/25/2022  Encounter Outcome:  Pt. Scheduled  Julian Hy, [place] Direct Dial: [phone]

## 2022-07-19 NOTE — Progress Notes (Signed)
Remote pacemaker transmission.   

## 2022-07-24 ENCOUNTER — Other Ambulatory Visit: Payer: Self-pay | Admitting: Primary Care

## 2022-07-24 ENCOUNTER — Other Ambulatory Visit: Payer: Self-pay | Admitting: Internal Medicine

## 2022-07-24 ENCOUNTER — Ambulatory Visit: Payer: 59 | Attending: Cardiology | Admitting: Cardiology

## 2022-07-24 VITALS — BP 118/76 | HR 88 | Ht 71.0 in | Wt 215.6 lb

## 2022-07-24 DIAGNOSIS — F339 Major depressive disorder, recurrent, unspecified: Secondary | ICD-10-CM

## 2022-07-24 DIAGNOSIS — J439 Emphysema, unspecified: Secondary | ICD-10-CM

## 2022-07-24 DIAGNOSIS — I442 Atrioventricular block, complete: Secondary | ICD-10-CM

## 2022-07-24 DIAGNOSIS — Z950 Presence of cardiac pacemaker: Secondary | ICD-10-CM | POA: Diagnosis not present

## 2022-07-24 DIAGNOSIS — I2699 Other pulmonary embolism without acute cor pulmonale: Secondary | ICD-10-CM

## 2022-07-24 DIAGNOSIS — I4821 Permanent atrial fibrillation: Secondary | ICD-10-CM

## 2022-07-24 DIAGNOSIS — F419 Anxiety disorder, unspecified: Secondary | ICD-10-CM

## 2022-07-24 DIAGNOSIS — K922 Gastrointestinal hemorrhage, unspecified: Secondary | ICD-10-CM | POA: Diagnosis not present

## 2022-07-24 DIAGNOSIS — R5382 Chronic fatigue, unspecified: Secondary | ICD-10-CM | POA: Diagnosis not present

## 2022-07-24 DIAGNOSIS — Z95818 Presence of other cardiac implants and grafts: Secondary | ICD-10-CM

## 2022-07-24 DIAGNOSIS — R251 Tremor, unspecified: Secondary | ICD-10-CM

## 2022-07-24 MED ORDER — METOPROLOL TARTRATE 50 MG PO TABS: 50.0000 mg | ORAL_TABLET | Freq: Once | ORAL | 0 refills | Status: DC

## 2022-07-24 MED ORDER — CLOPIDOGREL BISULFATE 75 MG PO TABS: 75.0000 mg | ORAL_TABLET | Freq: Every day | ORAL | 3 refills | Status: DC

## 2022-07-24 MED ORDER — APIXABAN 5 MG PO TABS: 5.0000 mg | ORAL_TABLET | Freq: Two times a day (BID) | ORAL | 0 refills | Status: DC

## 2022-07-24 MED ORDER — AMOXICILLIN 500 MG PO TABS: 2000.0000 mg | ORAL_TABLET | ORAL | 6 refills | Status: DC

## 2022-07-24 NOTE — Patient Instructions (Addendum)
Medication Instructions:  Your physician has recommended you make the following change in your medication:  STOP ELIQUIS ON 4/21 START PLAVIX ON 4/22 START AMOXICILLIN 500 MG - TAKE 2000 MG (4 TABLETS) 1 HOUR PRIOR TO DENTAL CLEANINGS AND PROCEDURES. *If you need a refill on your cardiac medications before your next appointment, please call your pharmacy*   Lab Work: TODAY: BMET, CBC If you have labs (blood work) drawn today and your tests are completely normal, you will receive your results only by: MyChart Message (if you have MyChart) OR A paper copy in the mail If you have any lab test that is abnormal or we need to change your treatment, we will call you to review the results.   Testing/Procedures: SEE CT INSTRUCTION LETTER   Follow-Up: At [facility], you and your health needs are our priority.  As part of our continuing mission to provide you with exceptional heart care, we have created designated Provider Care Teams.  These Care Teams include your primary Cardiologist (physician) and Advanced Practice Providers (APPs -  Physician Assistants and Nurse Practitioners) who all work together to provide you with the care you need, when you need it.  We recommend signing up for the patient portal called "MyChart".  Sign up information is provided on this After Visit Summary.  MyChart is used to connect with patients for Virtual Visits (Telemedicine).  Patients are able to view lab/test results, encounter notes, upcoming appointments, etc.  Non-urgent messages can be sent to your provider as well.   To learn more about what you can do with MyChart, go to [email or web address].    Your next appointment:   KEEP SCHEDULED FOLLOW-UP

## 2022-07-24 NOTE — Progress Notes (Unsigned)
[facility]                                     Cardiology Office Note:    Date:  07/25/2022   ID:  [name] Cook, DOB [birth year], MRN [id number]  PCP:  [name], NP  [facility] Cardiologist:  [name], MD  [facility] Electrophysiologist:  [name], MD   Referring MD: [name], NP   Chief Complaint  Patient presents with   Follow-up    S/p LAAO    History of Present Illness:    [name] Cook is a 72 y.o. male with a hx of CHB s/p PPM, CAD, DM2, HLD, HTN, OSA, tobacco use, GI bleeding on AC and atrial flutter/ atrial fibrillation who was referred to Dr. Excell Seltzer for LAAO closure with Watchman.    [name] Cook was initially diagnosed with atrial fibrillation in 2012 and failed several AAD and DCCVs. He had an AV node ablation in 2014. He was on Plavix and warfarin but was admitted 02/27/22 with an acute GI bleed that required transfusion. He has required a total of 5 PRBC infusions. Warfarin was held at that time. He was readmitted 03/08/22 with an acute PE and was restarted on Eliquis. He tolerated this well however due to his hx of bleeding on [facility], he was interested in pursuing long term stroke prevention with Watchman.  He is now s/p LAAO closure with Watchman with a 31 mm Watchman FLX device under fluoroscopic and TEE guidance. He was restarted on Eliquis 5mg  BID for 45 days then will he will transition to Plavix monotherapy through 6 months duration. He will require dental SBE for 6 months. He will undergo repeat CT at 8 weeks to ensure proper seal of the device.    Today he is here alone and reports that he has been ok however is having more fatigue. He  denies bleeding in stool or urine. No chest pain, SOB, orthopnea, LE edema, palpitations, dizziness, presyncope, or syncope.    Past Medical History:  Diagnosis Date   Acute bronchitis with COPD 03/07/2021   Arthritis    "knees and lower back" (03/14/2013)   Atrial flutter    radiofrequency  ablation in 2001   CAD (coronary artery disease)    a. Nonobstructive. Cardiac cath in 2001-50% mid RI, normal LM, LAD, RCA b. cath 10/16/2014 95% mid RCA treated with DES, 99% ost D1 medical management due to small aneurysmal segment   Chronic anticoagulation    chronic Coumadin anticoagulation   Chronic obstructive pulmonary disease 04/20/2011   Diabetes mellitus, type 2    Elevated lipase 06/23/2021   GERD (gastroesophageal reflux disease)    Hyperlipidemia    Hypertension    with hypertensive heart disease   Left knee pain 10/25/2017   medial   Obesity    Persistent atrial fibrillation    recurrent atrial flutter since 2001 s/p DCCVs, multiple failed AADs, h/o tachy-mediated cardiomyopathy   Presence of Watchman left atrial appendage closure device 06/22/2022   Watchman  31mm FLX placed with Dr. Excell Seltzer   Shortness of breath    "can come on at any time" (03/14/2013)   Sleep apnea    "dx'd; couldn't wear the mask" (03/14/2013)   Symptomatic anemia 03/08/2022   Tobacco abuse     Past Surgical History:  Procedure Laterality Date   ATRIAL FLUTTER ABLATION  2002   atrial flutter; subsequently developed atrial fibrillation   AV NODE ABLATION  01/24/2013   CARDIAC CATHETERIZATION  2002   CARDIAC CATHETERIZATION N/A 10/16/2014   Procedure: Left Heart Cath and Coronary Angiography;  Surgeon: Tonny Bollman, MD;  Location: [facility] INVASIVE CV LAB;  Service: Cardiovascular;  Laterality: N/A;   CARDIOVERSION  05/31/2011   Procedure: CARDIOVERSION;  Surgeon: [name]. [name], MD;  Location: [facility] OR[facility];  Service: Cardiovascular;  Laterality: N/A;   CARPAL TUNNEL RELEASE Left 1980's   COLONOSCOPY WITH PROPOFOL N/A 12/30/2018   Procedure: COLONOSCOPY WITH PROPOFOL;  Surgeon: Wyline Mood, MD;  Location: [facility] ENDOSCOPY;  Service: Gastroenterology;  Laterality: N/A;   COLONOSCOPY WITH PROPOFOL N/A 12/04/2019   Procedure: COLONOSCOPY WITH PROPOFOL;  Surgeon: Wyline Mood, MD;  Location: [facility] ENDOSCOPY;   Service: Gastroenterology;  Laterality: N/A;   COLONOSCOPY WITH PROPOFOL N/A 06/13/2021   Procedure: COLONOSCOPY WITH PROPOFOL;  Surgeon: Wyline Mood, MD;  Location: [facility] ENDOSCOPY;  Service: Gastroenterology;  Laterality: N/A;   ESOPHAGOGASTRODUODENOSCOPY N/A 03/01/2022   Procedure: ESOPHAGOGASTRODUODENOSCOPY (EGD);  Surgeon: Vida Rigger, MD;  Location: [facility] ENDOSCOPY;  Service: Gastroenterology;  Laterality: N/A;   GIVENS CAPSULE STUDY Left 03/10/2022   Procedure: GIVENS CAPSULE STUDY;  Surgeon: Willis Modena, MD;  Location: [facility] ENDOSCOPY;  Service: Gastroenterology;  Laterality: Left;   INSERT / REPLACE / REMOVE PACEMAKER  01/24/2013    Medtronic Adapta L dual-chamber pacemaker, serial number [id number]    LEFT ATRIAL APPENDAGE OCCLUSION N/A 06/22/2022   Procedure: LEFT ATRIAL APPENDAGE OCCLUSION;  Surgeon: Tonny Bollman, MD;  Location: [facility] INVASIVE CV LAB;  Service: Cardiovascular;  Laterality: N/A;   LEFT HEART CATH AND CORONARY ANGIOGRAPHY N/A 06/07/2018   Procedure: LEFT HEART CATH AND CORONARY ANGIOGRAPHY;  Surgeon: Tonny Bollman, MD;  Location: [facility] INVASIVE CV LAB;  Service: Cardiovascular;  Laterality: N/A;   LEFT HEART CATHETERIZATION WITH CORONARY ANGIOGRAM N/A 03/17/2013   Procedure: LEFT HEART CATHETERIZATION WITH CORONARY ANGIOGRAM;  Surgeon: Kathleene Hazel, MD; LAD mild dz, D1 branch 100%, inferior branch 99%, CFX OK, RCA 50%, EF 65%     LOOP RECORDER IMPLANT  2002   PERMANENT PACEMAKER INSERTION N/A 01/24/2013   Procedure: PERMANENT PACEMAKER INSERTION;  Surgeon: Marinus Maw, MD;  Location: [facility] CATH LAB;  Service: Cardiovascular;  Laterality: N/A;   TEE WITHOUT CARDIOVERSION N/A 06/22/2022   Procedure: TRANSESOPHAGEAL ECHOCARDIOGRAM;  Surgeon: Tonny Bollman, MD;  Location: [facility] INVASIVE CV LAB;  Service: Cardiovascular;  Laterality: N/A;   TIBIAL TUBERCLERPLASTY  ~ 2003    Current Medications: Current Meds  Medication Sig   albuterol (VENTOLIN HFA) 108 (90 Base)  MCG/ACT inhaler INHALE 2 PUFFS BY MOUTH EVERY 4 HOURS AS NEEDED FOR WHEEZE OR FOR SHORTNESS OF BREATH   Ascorbic Acid (VITAMIN C) 1000 MG tablet Take 2,000 mg by mouth daily.   blood glucose meter kit and supplies KIT Dispense based on patient and insurance preference. Use up to four times daily as directed. (FOR ICD-9 250.00, 250.01).   budesonide-formoterol (SYMBICORT) 160-4.5 MCG/ACT inhaler INHALE 2 PUFFS INTO THE LUNGS TWICE A DAY (Patient taking differently: Inhale 2 puffs into the lungs daily as needed (for shortness of breath and wheezing).)   clopidogrel (PLAVIX) 75 MG tablet Take 1 tablet (75 mg total) by mouth daily. START ON 422 (Patient not taking: Reported on 07/25/2022)   diphenhydrAMINE (BENADRYL) 25 MG tablet Take 50 mg by mouth at bedtime as needed for itching.   fluticasone (FLONASE) 50 MCG/ACT nasal spray PLACE 1 SPRAY INTO  BOTH NOSTRILS DAILY AS NEEDED FOR ALLERGIES OR RHINITIS.   gabapentin (NEURONTIN) 300 MG capsule TAKE 1 CAPSULE BY MOUTH EVERY MORNING AND 2 CAPSULES BY MOUTH AT NIGHT FOR NEUROPATHY   glipiZIDE (GLUCOTROL XL) 10 MG 24 hr tablet TAKE 1 TABLET (10 MG TOTAL) BY MOUTH DAILY WITH BREAKFAST. FOR DIABETES.   glucose blood test strip 1 each by Other route as needed for other. Use to check blood sugar up to three times a day-Accu-Chek meter   isosorbide mononitrate (IMDUR) 30 MG 24 hr tablet Take 1 tablet (30 mg total) by mouth daily.   JARDIANCE 25 MG TABS tablet TAKE 1 TABLET (25 MG TOTAL) BY MOUTH DAILY BEFORE BREAKFAST. FOR DIABETES.   lisinopril (ZESTRIL) 2.5 MG tablet TAKE 1 TABLET BY MOUTH EVERY DAY   nitroGLYCERIN (NITROSTAT) 0.4 MG SL tablet Place 1 tablet (0.4 mg total) under the tongue every 5 (five) minutes as needed for chest pain. Do not exceed 3 tablets in 24 hours.   rosuvastatin (CRESTOR) 20 MG tablet TAKE 1 TABLET BY MOUTH EVERY DAY IN THE EVENING FOR CHOLESTEROL   sertraline (ZOLOFT) 100 MG tablet Take 1 tablet (100 mg total) by mouth daily. For anxiety  and depression.   tamsulosin (FLOMAX) 0.4 MG CAPS capsule TAKE 1 CAPSULE (0.4 MG TOTAL) BY MOUTH 2 (TWO) TIMES DAILY.   TYLENOL 500 MG tablet Take 1,000 mg by mouth daily.   [DISCONTINUED] amoxicillin (AMOXIL) 500 MG tablet Take 4 tablets (2,000 mg total) by mouth as directed. 1 HOUR PRIOR TO DENTAL APPOINTMENTS   [DISCONTINUED] apixaban (ELIQUIS) 5 MG TABS tablet Take 1 tablet (5 mg total) by mouth 2 (two) times daily.   [DISCONTINUED] metoprolol tartrate (LOPRESSOR) 50 MG tablet Take 1 tablet (50 mg total) by mouth once for 1 dose. 2 HOURS PRIOR TO CT SCAN   [DISCONTINUED] propranolol ER (INDERAL LA) 80 MG 24 hr capsule Take 1 capsule (80 mg total) by mouth at bedtime. For tremors     Allergies:   Januvia [sitagliptin] and Metformin and related   Social History   Socioeconomic History   Marital status: Widowed    Spouse name: Not on file   Number of children: 1   Years of education: Not on file   Highest education level: Not on file  Occupational History   Occupation: Unemployed    Employer: UNEMPLOYED  Tobacco Use   Smoking status: Former    Packs/day: 1.00    Years: 42.00    Additional pack years: 0.00    Total pack years: 42.00    Types: Cigarettes    Quit date: 12/30/2013    Years since quitting: 8.5   Smokeless tobacco: Never   Tobacco comments:    Former smoker 03/20/22  Vaping Use   Vaping Use: Never used  Substance and Sexual Activity   Alcohol use: Not Currently    Comment: 03/14/2013 "stopped drinking back in 2002; never had problem w/it"   Drug use: No   Sexual activity: Not Currently  Other Topics Concern   Not on file  Social History Narrative   Single.   Retired.    1 son, deceased.    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   Enjoys playing on the computer.       Social Determinants of Health   Financial Resource Strain: Low Risk  (10/07/2021)   Overall Financial Resource Strain (CARDIA)    Difficulty of  Paying Living Expenses: Not hard at all  Food Insecurity: No Food Insecurity (07/25/2022)   Hunger Vital Sign    Worried About Running Out of Food in the Last Year: Never true    Ran Out of Food in the Last Year: Never true  Transportation Needs: No Transportation Needs (07/25/2022)   PRAPARE - Administrator, Civil Service (Medical): No    Lack of Transportation (Non-Medical): No  Physical Activity: Unknown (10/07/2021)   Exercise Vital Sign    Days of Exercise per Week: 3 days    Minutes of Exercise per Session: Not on file  Stress: No Stress Concern Present (10/07/2021)   [employer] of Occupational Health - Occupational Stress Questionnaire    Feeling of Stress : Not at all  Social Connections: Socially Isolated (10/07/2021)   Social Connection and Isolation Panel [NHANES]    Frequency of Communication with Friends and Family: Once a week    Frequency of Social Gatherings with Friends and Family: Once a week    Attends Religious Services: Never    Database administrator or Organizations: No    Attends Banker Meetings: Never    Marital Status: Widowed    Family History: The patient's family history includes Alzheimer's disease in his mother; Osteoporosis in his mother.  ROS:   Please see the history of present illness.    All other systems reviewed and are negative.  EKGs/Labs/Other Studies Reviewed:    The following studies were reviewed today:  LAAO 06/22/22:  Successful LAA occlusion device placement with a 31 mm Watchman FLX device under fluoroscopic and TEE guidance   Recommend:  Apixaban 5 mg BID x 6 weeks, then plavix monotherapy through 6 months Surveillance CTA 8 weeks    CT 05/12/22:   IMPRESSION: 1. The left atrial appendage is a large chicken wing morphology without thrombus.   2. A 31 mm Watchman FLX is recommended based on average landing zone diameter due to oval shape. Shallow LAA depth noted which may preclude this device  from successful deployment. Structural heart team discussion recommended.   3. There is no thrombus in the left atrial appendage.   4. A mid/mid IAS puncture site is recommended.   5. Optimal deployment angle: RAO 25 CRA 16   6. Dilated aortic root up to 40 mm.    EKG:  EKG is not ordered today.    Recent Labs: 03/08/2022: Magnesium 2.1 04/07/2022: ALT 10; B Natriuretic Peptide 80.2 04/28/2022: TSH 1.17 07/24/2022: BUN WILL FOLLOW; Creatinine, Ser WILL FOLLOW; Hemoglobin 11.7; Platelets 133; Potassium WILL FOLLOW; Sodium WILL FOLLOW   Recent Lipid Panel    Component Value Date/Time   CHOL 132 10/26/2021 1243   CHOL 147 05/28/2019 1202   CHOL 125 01/23/2013 0151   TRIG 166.0 (H) 10/26/2021 1243   TRIG 208 (H) 01/23/2013 0151   TRIG 289 05/05/2008 0000   HDL 50.10 10/26/2021 1243   HDL 54 05/28/2019 1202   HDL 33 (L) 01/23/2013 0151   CHOLHDL 3 10/26/2021 1243   VLDL 33.2 10/26/2021 1243   VLDL 42 (H) 01/23/2013 0151   LDLCALC 49 10/26/2021 1243   LDLCALC 69 05/28/2019 1202   LDLCALC 56 03/28/2017 1433   LDLCALC 50 01/23/2013 0151   LDLCALC 119 05/05/2008 0000   Physical Exam:    VS:  BP 118/76   Pulse 88   Ht 5\' 11"  (1.803 m)   Wt 215 lb 9.6 oz (97.8 kg)   SpO2 98%   BMI 30.07 kg/m  Wt Readings from Last 3 Encounters:  07/24/22 215 lb 9.6 oz (97.8 kg)  07/05/22 213 lb 4 oz (96.7 kg)  06/22/22 212 lb (96.2 kg)    General: Well developed, well nourished, NAD Lungs:Clear to ausculation bilaterally. No wheezes, rales, or rhonchi. Breathing is unlabored. Cardiovascular: RRR with S1 S2. No murmurs Extremities: No edema. Neuro: Alert and oriented. No focal deficits. No facial asymmetry. MAE spontaneously. Psych: Responds to questions appropriately with normal affect.    ASSESSMENT/PLAN:    Persistent atrial fibrillation: s/p LAAO placement with 31mm Watchman FLX device.He was restarted on Eliquis 5mg  BID for 45 days (4/21) then will he will transition to  Plavix monotherapy (4/22) through 6 months duration. He will require dental SBE for 6 months however reports that he does not go and defers tx at this time. CT instructions reviewed with understanding. Obtain BMET today. Will also add CBC given fatigue and hx of GI bleed.   Fatigue: Reports ongoing fatigue however no overt bleeding in stool or urine. Will obtain CBC. I have also asked that he follow with his PCP if labs come back within his normal range.    Hx of CHB s/p PPM placement: Stable with last remote transmission showing stable function with no changes.    HTN: Stable with no changes needed at this time.    HLD: Stable, continue current regimen  Medication Adjustments/Labs and Tests Ordered: Current medicines are reviewed at length with the patient today.  Concerns regarding medicines are outlined above.  Orders Placed This Encounter  Procedures   Basic metabolic panel   CBC   Meds ordered this encounter  Medications   clopidogrel (PLAVIX) 75 MG tablet    Sig: Take 1 tablet (75 mg total) by mouth daily. START ON 422    Dispense:  90 tablet    Refill:  3    STOP ELIQUIS 4/21 ON AND START ON 4/22   DISCONTD: amoxicillin (AMOXIL) 500 MG tablet    Sig: Take 4 tablets (2,000 mg total) by mouth as directed. 1 HOUR PRIOR TO DENTAL APPOINTMENTS    Dispense:  12 tablet    Refill:  6   apixaban (ELIQUIS) 5 MG TABS tablet    Sig: Take 1 tablet (5 mg total) by mouth 2 (two) times daily. STOP ON 4/21    Dispense:  180 tablet    Refill:  0    STOP ON 4/21 AND START PLAVIX ON 4/22   DISCONTD: metoprolol tartrate (LOPRESSOR) 50 MG tablet    Sig: Take 1 tablet (50 mg total) by mouth once for 1 dose. 2 HOURS PRIOR TO CT SCAN    Dispense:  1 tablet    Refill:  0    Patient Instructions  Medication Instructions:  Your physician has recommended you make the following change in your medication:  STOP ELIQUIS ON 4/21 START PLAVIX ON 4/22 START AMOXICILLIN 500 MG - TAKE 2000 MG (4  TABLETS) 1 HOUR PRIOR TO DENTAL CLEANINGS AND PROCEDURES. *If you need a refill on your cardiac medications before your next appointment, please call your pharmacy*   Lab Work: TODAY: BMET, CBC If you have labs (blood work) drawn today and your tests are completely normal, you will receive your results only by: MyChart Message (if you have MyChart) OR A paper copy in the mail If you have any lab test that is abnormal or we need to change your treatment, we will call you to review the results.   Testing/Procedures: SEE  CT INSTRUCTION LETTER   Follow-Up: At [facility], you and your health needs are our priority.  As part of our continuing mission to provide you with exceptional heart care, we have created designated Provider Care Teams.  These Care Teams include your primary Cardiologist (physician) and Advanced Practice Providers (APPs -  Physician Assistants and Nurse Practitioners) who all work together to provide you with the care you need, when you need it.  We recommend signing up for the patient portal called "MyChart".  Sign up information is provided on this After Visit Summary.  MyChart is used to connect with patients for Virtual Visits (Telemedicine).  Patients are able to view lab/test results, encounter notes, upcoming appointments, etc.  Non-urgent messages can be sent to your provider as well.   To learn more about what you can do with MyChart, go to [email or web address].    Your next appointment:   KEEP SCHEDULED FOLLOW-UP   Signed, Georgie Chard, NP  07/25/2022 4:18 PM    [facility]

## 2022-07-25 ENCOUNTER — Ambulatory Visit: Payer: Self-pay

## 2022-07-25 NOTE — Patient Outreach (Signed)
  Care Coordination   Initial Visit Note   07/25/2022 Name: [name] Cook MRN: [id number] DOB: 22-Nov-1950  [name] Cook is a 72 y.o. year old male who sees [name], NP for primary care. I spoke with  Hall Busing by phone today.  What matters to the patients health and wellness today?  Patient reports having watchman procedure on 06/22/22. He states he is doing well after procedure. He denies any lightheadedness or dizziness.  Patient reports he monitors his blood pressure on occasion.  Reports blood pressure from yesterday was 118/76 pulse 88.  Patient verbally agreed to ongoing follow up with RNCM.    Goals Addressed             This Visit's Progress    management and education of health conditions.       Interventions Today    Flowsheet Row Most Recent Value  Chronic Disease   Chronic disease during today's visit Hypertension (HTN), Atrial Fibrillation (AFib)  General Interventions   General Interventions Discussed/Reviewed General Interventions Discussed, Doctor Visits, Labs  [Evaluation of current treatment for, HTN, A-FIB and patients adherence to treatment as established by provider. Assessed for home monitored blood pressures.  Assessed for A-fib symptoms. Discussed follow up with RNCN for care coordination.]  Doctor Visits Discussed/Reviewed Doctor Visits Discussed  [Reviewed scheduled/ upcoming provider appointments.]  Education Interventions   Education Provided Provided Printed Education, Provided Education  [Education information sent to patient on managing HTN, A-fib]  Provided Verbal Education On Other  [Discussed importance in monitoring blood pressure at least 2-3 times per week and recording. Reviewed A-fib symptoms.]  Pharmacy Interventions   Pharmacy Dicussed/Reviewed Pharmacy Topics Discussed  [medications reviewed and compliance discussed.]              SDOH assessments and interventions completed:  Yes  SDOH Interventions Today    Flowsheet Row  Most Recent Value  SDOH Interventions   Food Insecurity Interventions Intervention Not Indicated  Housing Interventions Intervention Not Indicated  Transportation Interventions Intervention Not Indicated        Care Coordination Interventions:  No, not indicated   Follow up plan: Follow up call scheduled for 08/21/22    Encounter Outcome:  Pt. Visit Completed   George Ina RN,BSN,CCM [facility] Care Coordination [phone] direct line

## 2022-07-25 NOTE — Patient Instructions (Signed)
Visit Information  Thank you for taking time to visit with me today. Please don't hesitate to contact me if I can be of assistance to you.   Following are the goals we discussed today:  Monitor blood pressure / pulse at least 2-3 times per week Review education article on Hypertension and A-fib.  Contact your RNCM if you have questions.  Continue to take you're medications as prescribed.  Attend provider appointments.     Our next appointment is by telephone on 08/21/22 at 1:30 pm  Please call the care guide team at [phone] if you need to cancel or reschedule your appointment.   If you are experiencing a Mental Health or Behavioral Health Crisis or need someone to talk to, please call the Suicide and Crisis Lifeline: 988 call 1-800-273-TALK (toll free, 24 hour hotline)  Patient verbalizes understanding of instructions and care plan provided today and agrees to view in MyChart. Active MyChart status and patient understanding of how to access instructions and care plan via MyChart confirmed with patient.     George Ina RN,BSN,CCM [facility] Care Coordination [phone] direct line

## 2022-07-26 LAB — CBC
Hematocrit: 38.8 % (ref 37.5–51.0)
Hemoglobin: 11.7 g/dL — ABNORMAL LOW (ref 13.0–17.7)
MCH: 27.9 pg (ref 26.6–33.0)
MCHC: 30.2 g/dL — ABNORMAL LOW (ref 31.5–35.7)
MCV: 92 fL (ref 79–97)
Platelets: 133 10*3/uL — ABNORMAL LOW (ref 150–450)
RBC: 4.2 x10E6/uL (ref 4.14–5.80)
RDW: 17.8 % — ABNORMAL HIGH (ref 11.6–15.4)
WBC: 4.8 10*3/uL (ref 3.4–10.8)

## 2022-07-26 LAB — BASIC METABOLIC PANEL
BUN/Creatinine Ratio: 22 (ref 10–24)
BUN: 23 mg/dL (ref 8–27)
CO2: 19 mmol/L — ABNORMAL LOW (ref 20–29)
Calcium: 9.2 mg/dL (ref 8.6–10.2)
Chloride: 110 mmol/L — ABNORMAL HIGH (ref 96–106)
Creatinine, Ser: 1.04 mg/dL (ref 0.76–1.27)
Glucose: 134 mg/dL — ABNORMAL HIGH (ref 70–99)
Potassium: 4.7 mmol/L (ref 3.5–5.2)
Sodium: 145 mmol/L — ABNORMAL HIGH (ref 134–144)
eGFR: 77 mL/min/{1.73_m2} (ref >59)

## 2022-08-02 ENCOUNTER — Ambulatory Visit (INDEPENDENT_AMBULATORY_CARE_PROVIDER_SITE_OTHER): Payer: 59 | Admitting: Urology

## 2022-08-02 VITALS — BP 104/68 | HR 81 | Ht 71.0 in | Wt 215.0 lb

## 2022-08-02 DIAGNOSIS — N138 Other obstructive and reflux uropathy: Secondary | ICD-10-CM

## 2022-08-02 DIAGNOSIS — N401 Enlarged prostate with lower urinary tract symptoms: Secondary | ICD-10-CM

## 2022-08-02 DIAGNOSIS — Z8546 Personal history of malignant neoplasm of prostate: Secondary | ICD-10-CM

## 2022-08-02 DIAGNOSIS — R310 Gross hematuria: Secondary | ICD-10-CM

## 2022-08-02 DIAGNOSIS — C61 Malignant neoplasm of prostate: Secondary | ICD-10-CM

## 2022-08-02 LAB — MICROSCOPIC EXAMINATION: Epithelial Cells (non renal): >10 /hpf — AB (ref 0–10)

## 2022-08-02 LAB — URINALYSIS, COMPLETE
Bilirubin, UA: NEGATIVE
Ketones, UA: NEGATIVE
Leukocytes,UA: NEGATIVE
Nitrite, UA: NEGATIVE
Protein,UA: NEGATIVE
RBC, UA: NEGATIVE
Specific Gravity, UA: 1.01 (ref 1.005–1.030)
Urobilinogen, Ur: 0.2 mg/dL (ref 0.2–1.0)
pH, UA: 5.5 (ref 5.0–7.5)

## 2022-08-02 NOTE — Progress Notes (Signed)
   08/02/22  CC:  Chief Complaint  Patient presents with   Cysto    HPI: 72 year old male with a personal history of prostate cancer status post radiation ADT who developed painless gross hematuria.  He presents for cystoscopy further evaluation of this.  As part of this workup, he underwent CT urogram on 07/10/2022 indicating infrarenal aortic aneurysm measuring 3.5 cm which is unchanged otherwise no GU pathology.  There were no vitals taken for this visit. NED. A&Ox3.   No respiratory distress   Abd soft, NT, ND Normal phallus with bilateral descended testicles  Cystoscopy Procedure Note  Patient identification was confirmed, informed consent was obtained, and patient was prepped using Betadine solution.  Lidocaine jelly was administered per urethral meatus.     Pre-Procedure: - Inspection reveals a normal caliber ureteral meatus.  Procedure: The flexible cystoscope was introduced without difficulty - No urethral strictures/lesions are present. - Enlarged prostate with vascular changes consistent with previous radiation - Normal bladder neck - Bilateral ureteral orifices identified - Bladder mucosa  reveals no ulcers, tumors, or lesions - No bladder stones - No trabeculation  Retroflexion shows very tiny median lobe   Post-Procedure: - Patient tolerated the procedure well  Assessment/ Plan:  1. Gross hematuria CT urogram and cystoscopy unremarkable  Suspect prostatic bleeding as the source of microscopic blood in the setting of previous prostate radiation and changes in prostatic vasculature  Incidental known aortic aneurysm appreciated today, stable  Patient was informed, aware needs follow-up in 2 years - Urinalysis, Complete - PSA; Future  2. Prostate cancer No evidence of disease, follow-up in 6 months with PSA as scheduled - PSA; Future  3. BPH with obstruction/lower urinary tract symptoms Continue Flomax - PSA; Future    Return in about 6  months (around 02/01/2023) for IPSS/ PVR/ PSA with Carollee Herter.  Vanna Scotland, MD

## 2022-08-09 ENCOUNTER — Telehealth (HOSPITAL_COMMUNITY): Payer: Self-pay | Admitting: Emergency Medicine

## 2022-08-09 NOTE — Telephone Encounter (Signed)
Attempted to call patient regarding upcoming cardiac CT appointment. °Left message on voicemail with name and callback number °Shelia Magallon RN Navigator Cardiac Imaging °[facility] °[phone] Office °[phone] Cell ° °

## 2022-08-10 ENCOUNTER — Ambulatory Visit (HOSPITAL_COMMUNITY)
Admission: RE | Admit: 2022-08-10 | Discharge: 2022-08-10 | Disposition: A | Discharge status: Home / Self Care | Payer: 59 | Source: Ambulatory Visit | Attending: Cardiovascular Disease | Admitting: Cardiovascular Disease

## 2022-08-10 DIAGNOSIS — I4821 Permanent atrial fibrillation: Secondary | ICD-10-CM | POA: Diagnosis not present

## 2022-08-10 DIAGNOSIS — Z95818 Presence of other cardiac implants and grafts: Secondary | ICD-10-CM | POA: Insufficient documentation

## 2022-08-10 MED ORDER — IOHEXOL 350 MG/ML SOLN
100.0000 mL | Freq: Once | INTRAVENOUS | Status: AC | PRN
  Administered 2022-08-10: 100 mL via INTRAVENOUS

## 2022-08-11 ENCOUNTER — Inpatient Hospital Stay: Payer: 59 | Attending: Radiation Oncology

## 2022-08-11 DIAGNOSIS — C61 Malignant neoplasm of prostate: Secondary | ICD-10-CM

## 2022-08-11 LAB — PSA: Prostatic Specific Antigen: 0.02 ng/mL (ref 0.00–4.00)

## 2022-08-15 ENCOUNTER — Telehealth: Payer: Self-pay

## 2022-08-15 NOTE — Telephone Encounter (Signed)
Reviewed results with patient who verbalized understanding.   The patient has stopped Eliquis and started Plavix as directed at post-procedure visit. Confirmed 6 month visit in August 2024. He was grateful for call and agreed with plan.   Medication list updated.

## 2022-08-15 NOTE — Telephone Encounter (Signed)
-----   Message from Tonny Bollman, MD sent at 08/14/2022  6:47 AM EDT ----- Continue current anticoagulation plan. Criteria met with < 5 mm peri-device leak. thx

## 2022-08-18 ENCOUNTER — Ambulatory Visit: Payer: Medicare Other | Admitting: Radiation Oncology

## 2022-08-21 ENCOUNTER — Ambulatory Visit: Payer: Self-pay

## 2022-08-21 ENCOUNTER — Ambulatory Visit: Payer: 59 | Attending: Radiation Oncology | Admitting: Radiation Oncology

## 2022-08-21 NOTE — Patient Outreach (Signed)
  Care Coordination   08/21/2022 Name: KENDYN PALER MRN: [id number] DOB: 06-Jul-1950   Care Coordination Outreach Attempts:  An unsuccessful telephone outreach was attempted for a scheduled appointment today. HIPAA compliant voice message left with call back phone number.   Follow Up Plan:  Additional outreach attempts will be made to offer the patient care coordination information and services.   Encounter Outcome:  No Answer   Care Coordination Interventions:  No, not indicated    George Ina [facility] Care Coordination [phone] direct line

## 2022-08-28 ENCOUNTER — Telehealth: Payer: Self-pay | Admitting: *Deleted

## 2022-08-28 NOTE — Progress Notes (Signed)
  Care Coordination Note  08/28/2022 Name: [name] Cook MRN: [id number] DOB: 1951/01/24  [name] Cook is a 72 y.o. year old male who is a primary care patient of [name], NP and is actively engaged with the care management team. I reached out to Hall Busing by phone today to assist with re-scheduling a follow up visit with the RN Case Manager  Follow up plan: Unsuccessful telephone outreach attempt made. A HIPAA compliant phone message was left for the patient providing contact information and requesting a return call.   Burman Nieves, CCMA Care Coordination Care Guide Direct Dial: [phone]

## 2022-08-29 ENCOUNTER — Other Ambulatory Visit: Payer: Self-pay | Admitting: Internal Medicine

## 2022-08-29 ENCOUNTER — Other Ambulatory Visit: Payer: Self-pay | Admitting: Primary Care

## 2022-08-29 DIAGNOSIS — J439 Emphysema, unspecified: Secondary | ICD-10-CM

## 2022-08-29 DIAGNOSIS — R251 Tremor, unspecified: Secondary | ICD-10-CM

## 2022-08-29 DIAGNOSIS — F32A Depression, unspecified: Secondary | ICD-10-CM

## 2022-09-03 ENCOUNTER — Other Ambulatory Visit: Payer: Self-pay | Admitting: Primary Care

## 2022-09-03 DIAGNOSIS — E1165 Type 2 diabetes mellitus with hyperglycemia: Secondary | ICD-10-CM

## 2022-09-04 ENCOUNTER — Encounter: Payer: Self-pay | Admitting: Primary Care

## 2022-09-04 NOTE — Telephone Encounter (Signed)
error 

## 2022-09-15 NOTE — Progress Notes (Signed)
  Care Coordination Note  09/15/2022 Name: [name] Cook MRN: [id number] DOB: [birth year]  [name] Cook is a 72 y.o. year old male who is a primary care patient of [name], NP and is actively engaged with the care management team. I reached out to Hall Busing by phone today to assist with re-scheduling a follow up visit with the RN Case Manager  Follow up plan: Telephone appointment with care management team member scheduled for: 10/09/2022  Burman Nieves, [facility] Care Coordination Care Guide Direct Dial: [phone]

## 2022-09-22 ENCOUNTER — Ambulatory Visit (INDEPENDENT_AMBULATORY_CARE_PROVIDER_SITE_OTHER): Payer: 59

## 2022-09-22 DIAGNOSIS — I442 Atrioventricular block, complete: Secondary | ICD-10-CM | POA: Diagnosis not present

## 2022-09-25 ENCOUNTER — Encounter: Payer: Self-pay | Admitting: Radiation Oncology

## 2022-09-25 ENCOUNTER — Ambulatory Visit
Admission: RE | Admit: 2022-09-25 | Discharge: 2022-09-25 | Disposition: A | Discharge status: Home / Self Care | Payer: 59 | Source: Ambulatory Visit | Attending: Radiation Oncology | Admitting: Radiation Oncology

## 2022-09-25 VITALS — BP 101/65 | HR 82 | Temp 97.9°F | Resp 20 | Ht 71.0 in | Wt 208.0 lb

## 2022-09-25 DIAGNOSIS — Z923 Personal history of irradiation: Secondary | ICD-10-CM | POA: Diagnosis not present

## 2022-09-25 DIAGNOSIS — Z191 Hormone sensitive malignancy status: Secondary | ICD-10-CM | POA: Diagnosis not present

## 2022-09-25 DIAGNOSIS — C61 Malignant neoplasm of prostate: Secondary | ICD-10-CM | POA: Insufficient documentation

## 2022-09-25 NOTE — Progress Notes (Signed)
Radiation Oncology Follow up Note  Name: [name] Cook   Date:   09/25/2022 MRN:  [id number] DOB: 1950-04-26    This 72 y.o. male presents to the clinic today for 4-year follow-up status post IMRT radiation therapy to his prostate for Gleason 7 (3+4) adenocarcinoma presenting with a PSA of 6.1.  REFERRING PROVIDER: [name], NP  HPI: Patient is a 72 year old male now out 4 years having completed image guided IMRT radiation therapy for Gleason 7 adenocarcinoma the prostate.  Seen today in routine follow-up he is doing well.  He specifically denies any increased lower urinary tract symptoms diarrhea or fatigue.  His most recent PSA is 0.02.  He was having some hematuria although cystoscopy and CT urogram were unremarkable.  He was found to have an aortic aneurysm which is being followed.  COMPLICATIONS OF TREATMENT: none  FOLLOW UP COMPLIANCE: keeps appointments   PHYSICAL EXAM:  BP 101/65   Pulse 82   Temp 97.9 F (36.6 C) (Tympanic)   Resp 20   Ht 5\' 11"  (1.803 m)   Wt 208 lb (94.3 kg)   BMI 29.01 kg/m  Well-developed well-nourished patient in NAD. HEENT reveals PERLA, EOMI, discs not visualized.  Oral cavity is clear. No oral mucosal lesions are identified. Neck is clear without evidence of cervical or supraclavicular adenopathy. Lungs are clear to A&P. Cardiac examination is essentially unremarkable with regular rate and rhythm without murmur rub or thrill. Abdomen is benign with no organomegaly or masses noted. Motor sensory and DTR levels are equal and symmetric in the upper and lower extremities. Cranial nerves II through XII are grossly intact. Proprioception is intact. No peripheral adenopathy or edema is identified. No motor or sensory levels are noted. Crude visual fields are within normal range.  RADIOLOGY RESULTS: CT scan reviewed compatible with above-stated findings  PLAN: Present time patient is under excellent biochemical control of his prostate cancer.  Going to  turn follow-up care over to urology.  Be happy to reevaluate the patient in time should that be indicated.  He has had an excellent biochemical response.  Patient knows to call in the future with any concerns.  I would like to take this opportunity to thank you for allowing me to participate in the care of your patient.Carmina Miller, MD

## 2022-09-26 LAB — CUP PACEART REMOTE DEVICE CHECK
Battery Impedance: 1462 Ohm
Battery Remaining Longevity: 51 mo
Battery Voltage: 2.77 V
Brady Statistic RV Percent Paced: 100 %
Date Time Interrogation Session: 20240610182214
Implantable Lead Connection Status: 753985
Implantable Lead Connection Status: 753985
Implantable Lead Implant Date: 20141010
Implantable Lead Implant Date: 20141010
Implantable Lead Location: 753859
Implantable Lead Location: 753860
Implantable Lead Model: 5076
Implantable Lead Model: 5076
Implantable Pulse Generator Implant Date: 20141010
Lead Channel Impedance Value: 641 Ohm
Lead Channel Impedance Value: 67 Ohm
Lead Channel Pacing Threshold Amplitude: 0.875 V
Lead Channel Pacing Threshold Pulse Width: 0.4 ms
Lead Channel Setting Pacing Amplitude: 2.5 V
Lead Channel Setting Pacing Pulse Width: 0.4 ms
Lead Channel Setting Sensing Sensitivity: 4 mV
Zone Setting Status: 755011
Zone Setting Status: 755011

## 2022-09-28 ENCOUNTER — Telehealth: Payer: Self-pay | Admitting: Pharmacist

## 2022-09-28 NOTE — Progress Notes (Signed)
Contacted patient to cancel upcoming appointment with [facility] Pharmacist. Left voicemail to return my call with any medication related needs at this time; otherwise, plan to keep follow up with PCP in July.   Will also send MyChart.    Catie Eppie Gibson, PharmD, BCACP, CPP [facility] [phone]

## 2022-10-03 ENCOUNTER — Encounter: Payer: 59 | Admitting: Pharmacist

## 2022-10-07 ENCOUNTER — Other Ambulatory Visit: Payer: Self-pay | Admitting: Primary Care

## 2022-10-07 DIAGNOSIS — F32A Depression, unspecified: Secondary | ICD-10-CM

## 2022-10-09 ENCOUNTER — Ambulatory Visit: Payer: Self-pay

## 2022-10-09 NOTE — Progress Notes (Signed)
Remote pacemaker transmission.   

## 2022-10-09 NOTE — Patient Outreach (Signed)
  Care Coordination   10/09/2022 Name: [name] Cook MRN: [id number] DOB: [birth year]   Care Coordination Outreach Attempts:  An unsuccessful telephone outreach was attempted for a scheduled appointment today. HIPAA compliant message left with call back phone number.   Follow Up Plan:  Additional outreach attempts will be made to offer the patient care coordination information and services.   Encounter Outcome:  No Answer   Care Coordination Interventions:  No, not indicated    George Ina [facility] Care Coordination [phone] direct line

## 2022-10-10 ENCOUNTER — Ambulatory Visit (INDEPENDENT_AMBULATORY_CARE_PROVIDER_SITE_OTHER): Payer: 59

## 2022-10-10 VITALS — Ht 71.0 in | Wt 205.0 lb

## 2022-10-10 DIAGNOSIS — Z0000 Encounter for general adult medical examination without abnormal findings: Secondary | ICD-10-CM | POA: Diagnosis not present

## 2022-10-10 NOTE — Patient Instructions (Signed)
[name] Cook , Thank you for taking time to come for your Medicare Wellness Visit. I appreciate your ongoing commitment to your health goals. Please review the following plan we discussed and let me know if I can assist you in the future.   These are the goals we discussed:  Goals      Care coordination activities - no follow up needed     Care coordination activities Care coordination program/ services discussed  Social determinants of health survey completed Patient advised to contact primary care provider office  if care coordination services needed in the future Discussed recent ED visits and verbally assessed current health status.       DIET - EAT MORE FRUITS AND VEGETABLES     management and education of health conditions.     Interventions Today    Flowsheet Row Most Recent Value  Chronic Disease   Chronic disease during today's visit Hypertension (HTN), Atrial Fibrillation (AFib)  General Interventions   General Interventions Discussed/Reviewed General Interventions Discussed, Doctor Visits, Labs  [Evaluation of current treatment for, HTN, A-FIB and patients adherence to treatment as established by provider. Assessed for home monitored blood pressures.  Assessed for A-fib symptoms. Discussed follow up with [facility] for care coordination.]  Doctor Visits Discussed/Reviewed Doctor Visits Discussed  [Reviewed scheduled/ upcoming provider appointments.]  Education Interventions   Education Provided Provided Printed Education, Provided Education  [Education information sent to patient on managing HTN, A-fib]  Provided Verbal Education On Other  [Discussed importance in monitoring blood pressure at least 2-3 times per week and recording. Reviewed A-fib symptoms.]  Pharmacy Interventions   Pharmacy Dicussed/Reviewed Pharmacy Topics Discussed  [medications reviewed and compliance discussed.]           Monitor and Manage My Blood Sugar-Diabetes Type 2     Timeframe:  Long-Range  Goal Priority:  Medium Start Date:         06/10/21                    Expected End Date:      06/10/22                 Follow Up Date May 2023   - check blood sugar at prescribed times - check blood sugar if I feel it is too high or too low - take the blood sugar log to all doctor visits    Why is this important?   Checking your blood sugar at home helps to keep it from getting very high or very low.  Writing the results in a diary or log helps the doctor know how to care for you.  Your blood sugar log should have the time, date and the results.  Also, write down the amount of insulin or other medicine that you take.  Other information, like what you ate, exercise done and how you were feeling, will also be helpful.     Notes:      Patient Stated     Starting 09/23/2018, I will continue to take medications as prescribed.      Patient Stated     10/06/2019, I will maintain and continue medications as prescribed.      Patient Stated     10/06/2020, I will maintain and continue medications as prescribed.      Patient Stated     Eat more fruits and vegetables.        This is a list of the screening recommended for you  and due dates:  Health Maintenance  Topic Date Due   Pneumonia Vaccine (3 of 3 - PPSV23 or PCV20) 03/23/2023*   Hemoglobin A1C  10/27/2022   Flu Shot  11/16/2022   Eye exam for diabetics  03/31/2023   Complete foot exam   04/29/2023   Yearly kidney health urinalysis for diabetes  05/02/2023   Screening for Lung Cancer  05/09/2023   Yearly kidney function blood test for diabetes  07/24/2023   Medicare Annual Wellness Visit  10/10/2023   Colon Cancer Screening  06/13/2024   Hepatitis C Screening  Completed   HPV Vaccine  Aged Out   DTaP/Tdap/Td vaccine  Discontinued   COVID-19 Vaccine  Discontinued   Zoster (Shingles) Vaccine  Discontinued  *Topic was postponed. The date shown is not the original due date.    Advanced directives: none  Conditions/risks  identified: Aim for 30 minutes of exercise or brisk walking, 6-8 glasses of water, and 5 servings of fruits and vegetables each day.   Next appointment: Follow up in one year for your annual wellness visit. 10/11/23 @ 10:15  Preventive Care 65 Years and Older, Male  Preventive care refers to lifestyle choices and visits with your health care provider that can promote health and wellness. What does preventive care include? A yearly physical exam. This is also called an annual well check. Dental exams once or twice a year. Routine eye exams. Ask your health care provider how often you should have your eyes checked. Personal lifestyle choices, including: Daily care of your teeth and gums. Regular physical activity. Eating a healthy diet. Avoiding tobacco and drug use. Limiting alcohol use. Practicing safe sex. Taking low doses of aspirin every day. Taking vitamin and mineral supplements as recommended by your health care provider. What happens during an annual well check? The services and screenings done by your health care provider during your annual well check will depend on your age, overall health, lifestyle risk factors, and family history of disease. Counseling  Your health care provider may ask you questions about your: Alcohol use. Tobacco use. Drug use. Emotional well-being. Home and relationship well-being. Sexual activity. Eating habits. History of falls. Memory and ability to understand (cognition). Work and work Astronomer. Screening  You may have the following tests or measurements: Height, weight, and BMI. Blood pressure. Lipid and cholesterol levels. These may be checked every 5 years, or more frequently if you are over 50 years old. Skin check. Lung cancer screening. You may have this screening every year starting at age 69 if you have a 30-pack-year history of smoking and currently smoke or have quit within the past 15 years. Fecal occult blood test (FOBT) of  the stool. You may have this test every year starting at age 62. Flexible sigmoidoscopy or colonoscopy. You may have a sigmoidoscopy every 5 years or a colonoscopy every 10 years starting at age 16. Prostate cancer screening. Recommendations will vary depending on your family history and other risks. Hepatitis C blood test. Hepatitis B blood test. Sexually transmitted disease (STD) testing. Diabetes screening. This is done by checking your blood sugar (glucose) after you have not eaten for a while (fasting). You may have this done every 1-3 years. Abdominal aortic aneurysm (AAA) screening. You may need this if you are a current or former smoker. Osteoporosis. You may be screened starting at age 18 if you are at high risk. Talk with your health care provider about your test results, treatment options, and if necessary, the  need for more tests. Vaccines  Your health care provider may recommend certain vaccines, such as: Influenza vaccine. This is recommended every year. Tetanus, diphtheria, and acellular pertussis (Tdap, Td) vaccine. You may need a Td booster every 10 years. Zoster vaccine. You may need this after age 55. Pneumococcal 13-valent conjugate (PCV13) vaccine. One dose is recommended after age 25. Pneumococcal polysaccharide (PPSV23) vaccine. One dose is recommended after age 68. Talk to your health care provider about which screenings and vaccines you need and how often you need them. This information is not intended to replace advice given to you by your health care provider. Make sure you discuss any questions you have with your health care provider. Document Released: 04/30/2015 Document Revised: 12/22/2015 Document Reviewed: 02/02/2015 Elsevier Interactive Patient Education  2017 [employer].  Fall Prevention in the Home Falls can cause injuries. They can happen to people of all ages. There are many things you can do to make your home safe and to help prevent falls. What can I  do on the outside of my home? Regularly fix the edges of walkways and driveways and fix any cracks. Remove anything that might make you trip as you walk through a door, such as a raised step or threshold. Trim any bushes or trees on the path to your home. Use bright outdoor lighting. Clear any walking paths of anything that might make someone trip, such as rocks or tools. Regularly check to see if handrails are loose or broken. Make sure that both sides of any steps have handrails. Any raised decks and porches should have guardrails on the edges. Have any leaves, snow, or ice cleared regularly. Use sand or salt on walking paths during winter. Clean up any spills in your garage right away. This includes oil or grease spills. What can I do in the bathroom? Use night lights. Install grab bars by the toilet and in the tub and shower. Do not use towel bars as grab bars. Use non-skid mats or decals in the tub or shower. If you need to sit down in the shower, use a plastic, non-slip stool. Keep the floor dry. Clean up any water that spills on the floor as soon as it happens. Remove soap buildup in the tub or shower regularly. Attach bath mats securely with double-sided non-slip rug tape. Do not have throw rugs and other things on the floor that can make you trip. What can I do in the bedroom? Use night lights. Make sure that you have a light by your bed that is easy to reach. Do not use any sheets or blankets that are too big for your bed. They should not hang down onto the floor. Have a firm chair that has side arms. You can use this for support while you get dressed. Do not have throw rugs and other things on the floor that can make you trip. What can I do in the kitchen? Clean up any spills right away. Avoid walking on wet floors. Keep items that you use a lot in easy-to-reach places. If you need to reach something above you, use a strong step stool that has a grab bar. Keep electrical  cords out of the way. Do not use floor polish or wax that makes floors slippery. If you must use wax, use non-skid floor wax. Do not have throw rugs and other things on the floor that can make you trip. What can I do with my stairs? Do not leave any items on the  stairs. Make sure that there are handrails on both sides of the stairs and use them. Fix handrails that are broken or loose. Make sure that handrails are as long as the stairways. Check any carpeting to make sure that it is firmly attached to the stairs. Fix any carpet that is loose or worn. Avoid having throw rugs at the top or bottom of the stairs. If you do have throw rugs, attach them to the floor with carpet tape. Make sure that you have a light switch at the top of the stairs and the bottom of the stairs. If you do not have them, ask someone to add them for you. What else can I do to help prevent falls? Wear shoes that: Do not have high heels. Have rubber bottoms. Are comfortable and fit you well. Are closed at the toe. Do not wear sandals. If you use a stepladder: Make sure that it is fully opened. Do not climb a closed stepladder. Make sure that both sides of the stepladder are locked into place. Ask someone to hold it for you, if possible. Clearly mark and make sure that you can see: Any grab bars or handrails. First and last steps. Where the edge of each step is. Use tools that help you move around (mobility aids) if they are needed. These include: Canes. Walkers. Scooters. Crutches. Turn on the lights when you go into a dark area. Replace any light bulbs as soon as they burn out. Set up your furniture so you have a clear path. Avoid moving your furniture around. If any of your floors are uneven, fix them. If there are any pets around you, be aware of where they are. Review your medicines with your doctor. Some medicines can make you feel dizzy. This can increase your chance of falling. Ask your doctor what other  things that you can do to help prevent falls. This information is not intended to replace advice given to you by your health care provider. Make sure you discuss any questions you have with your health care provider. Document Released: 01/28/2009 Document Revised: 09/09/2015 Document Reviewed: 05/08/2014 Elsevier Interactive Patient Education  2017 [employer].

## 2022-10-10 NOTE — Progress Notes (Signed)
Subjective:   [name] Cook is a 72 y.o. male who presents for Medicare Annual/Subsequent preventive examination.  Visit Complete: Virtual  I connected with  Hall Busing on 10/10/22 by a audio enabled telemedicine application and verified that I am speaking with the correct person using two identifiers.  Patient Location: Home  Provider Location: Home Office  I discussed the limitations of evaluation and management by telemedicine. The patient expressed understanding and agreed to proceed.   Review of Systems      Cardiac Risk Factors include: advanced age (>57men, >63 women);hypertension;male gender;sedentary lifestyle;diabetes mellitus;dyslipidemia;obesity (BMI >30kg/m2)     Objective:    Today's Vitals   10/10/22 1115  Weight: 205 lb (93 kg)  Height: 5\' 11"  (1.803 m)   Body mass index is 28.59 kg/m.     10/10/2022   11:24 AM 09/25/2022   11:10 AM 04/24/2022    9:53 PM 04/07/2022    8:28 PM 03/09/2022   12:00 AM 02/27/2022    6:30 PM 02/27/2022    2:49 PM  Advanced Directives  Does Patient Have a Medical Advance Directive? No No No No No No No  Would patient like information on creating a medical advance directive? No - Patient declined No - Patient declined   No - Patient declined No - Patient declined No - Patient declined    Current Medications (verified) Outpatient Encounter Medications as of 10/10/2022  Medication Sig   albuterol (VENTOLIN HFA) 108 (90 Base) MCG/ACT inhaler INHALE 2 PUFFS BY MOUTH EVERY 4 HOURS AS NEEDED FOR WHEEZE OR FOR SHORTNESS OF BREATH   Ascorbic Acid (VITAMIN C) 1000 MG tablet Take 2,000 mg by mouth daily.   blood glucose meter kit and supplies KIT Dispense based on patient and insurance preference. Use up to four times daily as directed. (FOR ICD-9 250.00, 250.01).   budesonide-formoterol (SYMBICORT) 160-4.5 MCG/ACT inhaler INHALE 2 PUFFS INTO THE LUNGS TWICE A DAY (Patient taking differently: Inhale 2 puffs into the lungs daily as needed  (for shortness of breath and wheezing).)   clopidogrel (PLAVIX) 75 MG tablet Take 1 tablet (75 mg total) by mouth daily. START ON 422   diphenhydrAMINE (BENADRYL) 25 MG tablet Take 50 mg by mouth at bedtime as needed for itching.   fluticasone (FLONASE) 50 MCG/ACT nasal spray PLACE 1 SPRAY INTO BOTH NOSTRILS DAILY AS NEEDED FOR ALLERGIES OR RHINITIS.   gabapentin (NEURONTIN) 300 MG capsule TAKE 1 CAPSULE BY MOUTH EVERY MORNING AND 2 CAPSULES BY MOUTH AT NIGHT FOR NEUROPATHY   glipiZIDE (GLUCOTROL XL) 10 MG 24 hr tablet TAKE 1 TABLET (10 MG TOTAL) BY MOUTH DAILY WITH BREAKFAST. FOR DIABETES.   glucose blood (ACCU-CHEK GUIDE) test strip TEST FOUR TIMES A DAY   isosorbide mononitrate (IMDUR) 30 MG 24 hr tablet Take 1 tablet (30 mg total) by mouth daily.   JARDIANCE 25 MG TABS tablet TAKE 1 TABLET (25 MG TOTAL) BY MOUTH DAILY BEFORE BREAKFAST. FOR DIABETES.   lisinopril (ZESTRIL) 2.5 MG tablet TAKE 1 TABLET BY MOUTH EVERY DAY   nitroGLYCERIN (NITROSTAT) 0.4 MG SL tablet Place 1 tablet (0.4 mg total) under the tongue every 5 (five) minutes as needed for chest pain. Do not exceed 3 tablets in 24 hours.   pantoprazole (PROTONIX) 40 MG tablet Take 1 tablet (40 mg total) by mouth daily.   propranolol ER (INDERAL LA) 80 MG 24 hr capsule TAKE 1 CAPSULE (80 MG TOTAL) BY MOUTH AT BEDTIME. FOR TREMORS   rosuvastatin (CRESTOR) 20  MG tablet TAKE 1 TABLET BY MOUTH EVERY DAY IN THE EVENING FOR CHOLESTEROL   sertraline (ZOLOFT) 100 MG tablet TAKE 1 TABLET (100 MG TOTAL) BY MOUTH DAILY. FOR ANXIETY AND DEPRESSION.   tamsulosin (FLOMAX) 0.4 MG CAPS capsule TAKE 1 CAPSULE (0.4 MG TOTAL) BY MOUTH 2 (TWO) TIMES DAILY.   TYLENOL 500 MG tablet Take 1,000 mg by mouth daily.   No facility-administered encounter medications on file as of 10/10/2022.    Allergies (verified) Januvia [sitagliptin] and Metformin and related   History: Past Medical History:  Diagnosis Date   Acute bronchitis with COPD (HCC) 03/07/2021    Arthritis    "knees and lower back" (03/14/2013)   Atrial flutter (HCC)    radiofrequency ablation in 2001   CAD (coronary artery disease)    a. Nonobstructive. Cardiac cath in 2001-50% mid RI, normal LM, LAD, RCA b. cath 10/16/2014 95% mid RCA treated with DES, 99% ost D1 medical management due to small aneurysmal segment   Chronic anticoagulation    chronic Coumadin anticoagulation   Chronic obstructive pulmonary disease (HCC) 04/20/2011   Diabetes mellitus, type 2 (HCC)    Elevated lipase 06/23/2021   GERD (gastroesophageal reflux disease)    Hyperlipidemia    Hypertension    with hypertensive heart disease   Left knee pain 10/25/2017   medial   Obesity    Persistent atrial fibrillation (HCC)    recurrent atrial flutter since 2001 s/p DCCVs, multiple failed AADs, h/o tachy-mediated cardiomyopathy   Presence of Watchman left atrial appendage closure device 06/22/2022   Watchman  31mm FLX placed with Dr. Excell Seltzer   Shortness of breath    "can come on at any time" (03/14/2013)   Sleep apnea    "dx'd; couldn't wear the mask" (03/14/2013)   Symptomatic anemia 03/08/2022   Tobacco abuse    Past Surgical History:  Procedure Laterality Date   ATRIAL FLUTTER ABLATION  2002   atrial flutter; subsequently developed atrial fibrillation   AV NODE ABLATION  01/24/2013   CARDIAC CATHETERIZATION  2002   CARDIAC CATHETERIZATION N/A 10/16/2014   Procedure: Left Heart Cath and Coronary Angiography;  Surgeon: Tonny Bollman, MD;  Location: [facility] INVASIVE CV LAB;  Service: Cardiovascular;  Laterality: N/A;   CARDIOVERSION  05/31/2011   Procedure: CARDIOVERSION;  Surgeon: [name]. [name], MD;  Location: [facility] ORS;  Service: Cardiovascular;  Laterality: N/A;   CARPAL TUNNEL RELEASE Left 1980's   COLONOSCOPY WITH PROPOFOL N/A 12/30/2018   Procedure: COLONOSCOPY WITH PROPOFOL;  Surgeon: Wyline Mood, MD;  Location: [facility] ENDOSCOPY;  Service: Gastroenterology;  Laterality: N/A;   COLONOSCOPY WITH PROPOFOL N/A  12/04/2019   Procedure: COLONOSCOPY WITH PROPOFOL;  Surgeon: Wyline Mood, MD;  Location[facility] ENDOSCOPY;  Service: Gastroenterology;  Laterality: N/A;   COLONOSCOPY WITH PROPOFOL N/A 06/13/2021   Procedure: COLONOSCOPY WITH PROPOFOL;  Surgeon: Wyline Mood, MD;  Location: [facility] ENDOSCOPY;  Service: Gastroenterology;  Laterality: N/A;   ESOPHAGOGASTRODUODENOSCOPY N/A 03/01/2022   Procedure: ESOPHAGOGASTRODUODENOSCOPY (EGD);  Surgeon: Vida Rigger, MD;  Location: [facility] ENDOSCOPY;  Service: Gastroenterology;  Laterality: N/A;   GIVENS CAPSULE STUDY Left 03/10/2022   Procedure: GIVENS CAPSULE STUDY;  Surgeon: Willis Modena, MD;  Location: [facility] ENDOSCOPY;  Service: Gastroenterology;  Laterality: Left;   INSERT / REPLACE / REMOVE PACEMAKER  01/24/2013    Medtronic Adapta L dual-chamber pacemaker, serial number [id number]    LEFT ATRIAL APPENDAGE OCCLUSION N/A 06/22/2022   Procedure: LEFT ATRIAL APPENDAGE OCCLUSION;  Surgeon: Tonny Bollman, MD;  Location: [facility]  INVASIVE CV LAB;  Service: Cardiovascular;  Laterality: N/A;   LEFT HEART CATH AND CORONARY ANGIOGRAPHY N/A 06/07/2018   Procedure: LEFT HEART CATH AND CORONARY ANGIOGRAPHY;  Surgeon: Tonny Bollman, MD;  Location: [facility] INVASIVE CV LAB;  Service: Cardiovascular;  Laterality: N/A;   LEFT HEART CATHETERIZATION WITH CORONARY ANGIOGRAM N/A 03/17/2013   Procedure: LEFT HEART CATHETERIZATION WITH CORONARY ANGIOGRAM;  Surgeon: Kathleene Hazel, MD; LAD mild dz, D1 branch 100%, inferior branch 99%, CFX OK, RCA 50%, EF 65%     LOOP RECORDER IMPLANT  2002   PERMANENT PACEMAKER INSERTION N/A 01/24/2013   Procedure: PERMANENT PACEMAKER INSERTION;  Surgeon: Marinus Maw, MD;  Location: [facility] CATH LAB;  Service: Cardiovascular;  Laterality: N/A;   TEE WITHOUT CARDIOVERSION N/A 06/22/2022   Procedure: TRANSESOPHAGEAL ECHOCARDIOGRAM;  Surgeon: Tonny Bollman, MD;  Location: [facility] INVASIVE CV LAB;  Service: Cardiovascular;  Laterality: N/A;   TIBIAL TUBERCLERPLASTY  ~ 2003    Family History  Problem Relation Age of Onset   Alzheimer's disease Mother    Osteoporosis Mother    Social History   Socioeconomic History   Marital status: Widowed    Spouse name: Not on file   Number of children: 1   Years of education: Not on file   Highest education level: Not on file  Occupational History   Occupation: Unemployed    Employer: UNEMPLOYED  Tobacco Use   Smoking status: Former    Packs/day: 1.00    Years: 42.00    Additional pack years: 0.00    Total pack years: 42.00    Types: Cigarettes    Quit date: 12/30/2013    Years since quitting: 8.7   Smokeless tobacco: Never   Tobacco comments:    Former smoker 03/20/22  Vaping Use   Vaping Use: Never used  Substance and Sexual Activity   Alcohol use: Not Currently    Comment: 03/14/2013 "stopped drinking back in 2002; never had problem w/it"   Drug use: No   Sexual activity: Not Currently  Other Topics Concern   Not on file  Social History Narrative   Single.   Retired.    1 son, deceased.    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   Enjoys playing on the computer.       Social Determinants of Health   Financial Resource Strain: Low Risk  (10/10/2022)   Overall Financial Resource Strain (CARDIA)    Difficulty of Paying Living Expenses: Not hard at all  Food Insecurity: No Food Insecurity (10/10/2022)   Hunger Vital Sign    Worried About Running Out of Food in the Last Year: Never true    Ran Out of Food in the Last Year: Never true  Transportation Needs: No Transportation Needs (10/10/2022)   PRAPARE - Administrator, Civil Service (Medical): No    Lack of Transportation (Non-Medical): No  Physical Activity: Insufficiently Active (10/10/2022)   Exercise Vital Sign    Days of Exercise per Week: 3 days    Minutes of Exercise per Session: 30 min  Stress: No Stress Concern Present (10/10/2022)   [employer] of Occupational Health -  Occupational Stress Questionnaire    Feeling of Stress : Not at all  Social Connections: Socially Isolated (10/10/2022)   Social Connection and Isolation Panel [NHANES]    Frequency of Communication with Friends and Family: More than three times a week    Frequency of Social Gatherings with Friends and Family:  Three times a week    Attends Religious Services: Never    Active Member of Clubs or Organizations: No    Attends Banker Meetings: Never    Marital Status: Widowed    Tobacco Counseling Counseling given: Not Answered Tobacco comments: Former smoker 03/20/22   Clinical Intake:  Pre-visit preparation completed: Yes  Pain : No/denies pain     BMI - recorded: 28.59 Nutritional Risks: None Diabetes: Yes CBG done?: No Did pt. bring in CBG monitor from home?: No  How often do you need to have someone help you when you read instructions, pamphlets, or other written materials from your doctor or pharmacy?: 1 - Never  Interpreter Needed?: No  Information entered by :: C.Krisy Dix LPN   Activities of Daily Living    10/10/2022   11:24 AM 06/22/2022    8:25 AM  In your present state of health, do you have any difficulty performing the following activities:  Hearing? 0   Vision? 0   Difficulty concentrating or making decisions? 0   Walking or climbing stairs? 0   Dressing or bathing? 0   Doing errands, shopping? 0 0  Preparing Food and eating ? N   Using the Toilet? N   In the past six months, have you accidently leaked urine? N   Do you have problems with loss of bowel control? N   Managing your Medications? N   Managing your Finances? N   Housekeeping or managing your Housekeeping? N     Patient Care Team: [name], NP as PCP - General (Internal Medicine) Marinus Maw, MD as PCP - Electrophysiology (Cardiology) Jake Bathe, MD as PCP - Cardiology (Cardiology) Rothbart, [name], MD (Inactive) (Cardiology) Carmina Miller, MD as  Radiation Oncologist (Radiation Oncology) Kathyrn Sheriff, [facility] (Inactive) as Pharmacist (Pharmacist)  Indicate any recent Medical Services you may have received from other than [facility] providers in the past year (date may be approximate).     Assessment:   This is a routine wellness examination for [name] Cook.  Hearing/Vision screen Hearing Screening - Comments:: No hearing difficulties Vision Screening - Comments:: Readers - [facility] - UTD on eye exams  Dietary issues and exercise activities discussed:     Goals Addressed             This Visit's Progress    Patient Stated       Eat more fruits and vegetables.       Depression Screen    10/10/2022   11:23 AM 10/10/2022   11:22 AM 07/05/2022    3:15 PM 05/26/2022    3:52 PM 04/28/2022   10:47 AM 03/22/2022   10:27 AM 10/07/2021   11:54 AM  PHQ 2/9 Scores  PHQ - 2 Score 0 0 0 0 0 0 0  PHQ- 9 Score   1 0  2 0    Fall Risk    10/10/2022   11:18 AM 07/25/2022    1:05 PM 07/05/2022    3:14 PM 05/26/2022    3:51 PM 04/28/2022   10:47 AM  Fall Risk   Falls in the past year? 0 0 0 0 0  Number falls in past yr: 0 0 0 0 0  Injury with Fall? 0  0 0 0  Risk for fall due to : No Fall Risks   No Fall Risks No Fall Risks  Follow up Falls prevention discussed;Falls evaluation completed   Falls evaluation completed Falls evaluation  completed    MEDICARE RISK AT HOME:  Medicare Risk at Home - 10/10/22 1125     Any stairs in or around the home? No    If so, are there any without handrails? No    Home free of loose throw rugs in walkways, pet beds, electrical cords, etc? Yes    Adequate lighting in your home to reduce risk of falls? Yes    Life alert? No    Use of a cane, walker or w/c? No    Grab bars in the bathroom? No    Shower chair or bench in shower? No    Elevated toilet seat or a handicapped toilet? No              Cognitive Function:    10/06/2020   12:16 PM 10/06/2019   12:07 PM 09/23/2018    9:29 PM 09/21/2017     1:08 PM  MMSE - Mini Mental State Exam  Not completed: Refused Refused    Orientation to time   5 5  Orientation to Place   5 5  Registration   3 3  Attention/ Calculation   0 0  Recall   0 1  Recall-comments   unable to recall 0 of 3 words unable to recall 2 of 3 words  Language- name 2 objects   0 0  Language- repeat   1 1  Language- follow 3 step command   0 3  Language- read & follow direction   0 0  Write a sentence   0 0  Copy design   0 0  Total score   14 18        10/10/2022   11:28 AM 10/07/2021   11:59 AM  6CIT Screen  What Year? 0 points 0 points  What month? 0 points 0 points  What time? 0 points 0 points  Count back from 20 0 points 0 points  Months in reverse 0 points 0 points  Repeat phrase 8 points 0 points  Total Score 8 points 0 points    Immunizations Immunization History  Administered Date(s) Administered   Fluad Quad(high Dose 65+) 02/02/2021, 12/23/2021   Influenza, High Dose Seasonal PF 02/20/2018, 02/03/2019   Influenza,inj,Quad PF,6+ Mos 02/05/2014, 01/04/2015, 04/20/2016   Influenza-Unspecified 01/05/2011, 01/15/2013, 01/30/2020   PFIZER(Purple Top)SARS-COV-2 Vaccination 09/17/2019, 10/08/2019   Pneumococcal Conjugate-13 09/21/2017   Pneumococcal Polysaccharide-23 01/05/2011   Tdap 01/05/2011    TDAP status: Due, Education has been provided regarding the importance of this vaccine. Advised may receive this vaccine at local pharmacy or Health Dept. Aware to provide a copy of the vaccination record if obtained from local pharmacy or Health Dept. Verbalized acceptance and understanding.  Flu Vaccine status: Up to date  Pneumococcal vaccine status: Due, Education has been provided regarding the importance of this vaccine. Advised may receive this vaccine at local pharmacy or Health Dept. Aware to provide a copy of the vaccination record if obtained from local pharmacy or Health Dept. Verbalized acceptance and understanding.  Covid-19 vaccine  status: Information provided on how to obtain vaccines.   Qualifies for Shingles Vaccine? Yes   Zostavax completed No   Shingrix Completed?: No.    Education has been provided regarding the importance of this vaccine. Patient has been advised to call insurance company to determine out of pocket expense if they have not yet received this vaccine. Advised may also receive vaccine at local pharmacy or Health Dept. Verbalized acceptance and understanding.  Screening  Tests Health Maintenance  Topic Date Due   Pneumonia Vaccine 13+ Years old (3 of 3 - PPSV23 or PCV20) 03/23/2023 (Originally 09/22/2018)   HEMOGLOBIN A1C  10/27/2022   INFLUENZA VACCINE  11/16/2022   OPHTHALMOLOGY EXAM  03/31/2023   FOOT EXAM  04/29/2023   Diabetic kidney evaluation - Urine ACR  05/02/2023   Lung Cancer Screening  05/09/2023   Diabetic kidney evaluation - eGFR measurement  07/24/2023   Medicare Annual Wellness (AWV)  10/10/2023   Colonoscopy  06/13/2024   Hepatitis C Screening  Completed   HPV VACCINES  Aged Out   DTaP/Tdap/Td  Discontinued   COVID-19 Vaccine  Discontinued   Zoster Vaccines- Shingrix  Discontinued    Health Maintenance  There are no preventive care reminders to display for this patient.   Colorectal cancer screening: Type of screening: Colonoscopy. Completed 06/13/21. Repeat every 3 years  Lung Cancer Screening: (Low Dose CT Chest recommended if Age 66-80 years, 20 pack-year currently smoking OR have quit w/in 15years.) does qualify.   Lung Cancer Screening Referral:  order placed 05/10/22  Additional Screening:  Hepatitis C Screening: does qualify; Completed 10/27/15  Vision Screening: Recommended annual ophthalmology exams for early detection of glaucoma and other disorders of the eye. Is the patient up to date with their annual eye exam?  Yes  Who is the provider or what is the name of the office in which the patient attends annual eye exams? [facility] If pt is not established  with a provider, would they like to be referred to a provider to establish care? Yes .   Dental Screening: Recommended annual dental exams for proper oral hygiene  Diabetic Foot Exam: Diabetic Foot Exam: Completed 04/28/22  Community Resource Referral / Chronic Care Management: CRR required this visit?  No   CCM required this visit?  No     Plan:     I have personally reviewed and noted the following in the patient's chart:   Medical and social history Use of alcohol, tobacco or illicit drugs  Current medications and supplements including opioid prescriptions. Patient is not currently taking opioid prescriptions. Functional ability and status Nutritional status Physical activity Advanced directives List of other physicians Hospitalizations, surgeries, and ER visits in previous 12 months Vitals Screenings to include cognitive, depression, and falls Referrals and appointments  In addition, I have reviewed and discussed with patient certain preventive protocols, quality metrics, and best practice recommendations. A written personalized care plan for preventive services as well as general preventive health recommendations were provided to patient.     Maryan Puls, LPN   6/96/2952   After Visit Summary: (MyChart) Due to this being a telephonic visit, the after visit summary with patients personalized plan was offered to patient via MyChart   Nurse Notes: none

## 2022-10-23 ENCOUNTER — Ambulatory Visit: Payer: Self-pay

## 2022-10-23 NOTE — Patient Outreach (Signed)
  Care Coordination   Follow Up Visit Note   10/23/2022 Name: [name] Cook MRN: [id number] DOB: April 25, 1950  [name] Cook is a 72 y.o. year old male who sees [name] Nest, NP for primary care. [name] spoke with  [name] Cook by phone today.  What matters to the patients health and wellness today?  Patient states he is doing well.  Denies A-fib symptoms. Patient reports blood pressures are ranging in the 120's/ 70's.  Patient denies any further needs or concerns and is agreeable that care coordination goals have been met.     Goals Addressed             This Visit's Progress    COMPLETED: management and education of health conditions.       Interventions Today    Flowsheet Row Most Recent Value  Chronic Disease   Chronic disease during today's visit Hypertension (HTN), Atrial Fibrillation (AFib)  General Interventions   General Interventions Discussed/Reviewed General Interventions Reviewed, Doctor Visits  [evaluation of current treatment plan for HTN/ A-fib and patients adherence to plan as established by provider.  Assessed BP readings and A-FIB  symptoms.]  Doctor Visits Discussed/Reviewed Doctor Visits Reviewed  [name] Cook upcoming provider visits. Advised to continue to follow up with providers as recommended.]  Education Interventions   Education Provided Provided Education  [Advised to continue monitoring BP as instructed by provider and report A-fib symptoms if experienced.  Advised to notify provider of BP readings outside of established parameters.]  Pharmacy Interventions   Pharmacy Dicussed/Reviewed Pharmacy Topics Reviewed  [reviewed medications and discussed compliance.]              SDOH assessments and interventions completed:  No     Care Coordination Interventions:  Yes, provided   Follow up plan: No further intervention required.   Encounter Outcome:  Pt. Visit Completed   [name] Ina RN,BSN,CCM [facility] Care Coordination [phone] direct line

## 2022-10-23 NOTE — Patient Instructions (Signed)
Visit Information  Thank you for taking time to visit with me today. Please don't hesitate to contact me if I can be of assistance to you.   Following are the goals we discussed today:   Goals Addressed             This Visit's Progress    COMPLETED: management and education of health conditions.       Interventions Today    Flowsheet Row Most Recent Value  Chronic Disease   Chronic disease during today's visit Hypertension (HTN), Atrial Fibrillation (AFib)  General Interventions   General Interventions Discussed/Reviewed General Interventions Reviewed, Doctor Visits  [evaluation of current treatment plan for HTN/ A-fib and patients adherence to plan as established by provider.  Assessed BP readings and A-FIB  symptoms.]  Doctor Visits Discussed/Reviewed Doctor Visits Reviewed  Annabell Sabal upcoming provider visits. Advised to continue to follow up with providers as recommended.]  Education Interventions   Education Provided Provided Education  [Advised to continue monitoring BP as instructed by provider and report A-fib symptoms if experienced.  Advised to notify provider of BP readings outside of established parameters.]  Pharmacy Interventions   Pharmacy Dicussed/Reviewed Pharmacy Topics Reviewed  [reviewed medications and discussed compliance.]              No further follow up required.  Goals met.   Please call the care guide team at [phone] if you need to cancel or reschedule your appointment.   If you are experiencing a Mental Health or Behavioral Health Crisis or need someone to talk to, please call the Suicide and Crisis Lifeline: 988 call 1-800-273-TALK (toll free, 24 hour hotline)  The patient verbalized understanding of instructions, educational materials, and care plan provided today and agreed to receive a mailed copy of patient instructions, educational materials, and care plan.   George Ina RN,BSN,CCM [facility] Care Coordination [phone] direct  line

## 2022-10-27 ENCOUNTER — Encounter: Payer: Self-pay | Admitting: Primary Care

## 2022-10-27 ENCOUNTER — Ambulatory Visit (INDEPENDENT_AMBULATORY_CARE_PROVIDER_SITE_OTHER): Payer: 59 | Admitting: Primary Care

## 2022-10-27 VITALS — BP 90/60 | HR 72 | Temp 97.9°F | Ht 71.0 in | Wt 208.0 lb

## 2022-10-27 DIAGNOSIS — I10 Essential (primary) hypertension: Secondary | ICD-10-CM

## 2022-10-27 DIAGNOSIS — F32A Depression, unspecified: Secondary | ICD-10-CM | POA: Diagnosis not present

## 2022-10-27 DIAGNOSIS — E785 Hyperlipidemia, unspecified: Secondary | ICD-10-CM | POA: Diagnosis not present

## 2022-10-27 DIAGNOSIS — I25119 Atherosclerotic heart disease of native coronary artery with unspecified angina pectoris: Secondary | ICD-10-CM

## 2022-10-27 DIAGNOSIS — R251 Tremor, unspecified: Secondary | ICD-10-CM

## 2022-10-27 DIAGNOSIS — E1340 Other specified diabetes mellitus with diabetic neuropathy, unspecified: Secondary | ICD-10-CM

## 2022-10-27 DIAGNOSIS — Z7984 Long term (current) use of oral hypoglycemic drugs: Secondary | ICD-10-CM | POA: Diagnosis not present

## 2022-10-27 DIAGNOSIS — Z0000 Encounter for general adult medical examination without abnormal findings: Secondary | ICD-10-CM | POA: Diagnosis not present

## 2022-10-27 DIAGNOSIS — I4891 Unspecified atrial fibrillation: Secondary | ICD-10-CM | POA: Diagnosis not present

## 2022-10-27 DIAGNOSIS — Z8546 Personal history of malignant neoplasm of prostate: Secondary | ICD-10-CM

## 2022-10-27 DIAGNOSIS — K219 Gastro-esophageal reflux disease without esophagitis: Secondary | ICD-10-CM | POA: Diagnosis not present

## 2022-10-27 DIAGNOSIS — J439 Emphysema, unspecified: Secondary | ICD-10-CM | POA: Diagnosis not present

## 2022-10-27 DIAGNOSIS — F419 Anxiety disorder, unspecified: Secondary | ICD-10-CM

## 2022-10-27 DIAGNOSIS — Z950 Presence of cardiac pacemaker: Secondary | ICD-10-CM

## 2022-10-27 DIAGNOSIS — E1165 Type 2 diabetes mellitus with hyperglycemia: Secondary | ICD-10-CM

## 2022-10-27 LAB — POCT GLYCOSYLATED HEMOGLOBIN (HGB A1C): Hemoglobin A1C: 6.3 % — AB (ref 4.0–5.6)

## 2022-10-27 LAB — LIPID PANEL
Cholesterol: 141 mg/dL (ref 0–200)
HDL: 55.1 mg/dL (ref >39.00)
LDL Cholesterol: 63 mg/dL (ref 0–99)
NonHDL: 85.81
Total CHOL/HDL Ratio: 3
Triglycerides: 115 mg/dL (ref 0.0–149.0)
VLDL: 23 mg/dL (ref 0.0–40.0)

## 2022-10-27 LAB — COMPREHENSIVE METABOLIC PANEL
ALT: 24 U/L (ref 0–53)
AST: 28 U/L (ref 0–37)
Albumin: 4.4 g/dL (ref 3.5–5.2)
Alkaline Phosphatase: 60 U/L (ref 39–117)
BUN: 23 mg/dL (ref 6–23)
CO2: 26 mEq/L (ref 19–32)
Calcium: 9.8 mg/dL (ref 8.4–10.5)
Chloride: 104 mEq/L (ref 96–112)
Creatinine, Ser: 1.25 mg/dL (ref 0.40–1.50)
GFR: 58.37 mL/min — ABNORMAL LOW (ref >60.00)
Glucose, Bld: 110 mg/dL — ABNORMAL HIGH (ref 70–99)
Potassium: 4.9 mEq/L (ref 3.5–5.1)
Sodium: 137 mEq/L (ref 135–145)
Total Bilirubin: 0.6 mg/dL (ref 0.2–1.2)
Total Protein: 6.9 g/dL (ref 6.0–8.3)

## 2022-10-27 NOTE — Assessment & Plan Note (Signed)
Controlled.  Continue albuterol inhaler as needed for which he uses infrequently.

## 2022-10-27 NOTE — Assessment & Plan Note (Signed)
Asymptomatic.  Plavix 75 mg daily, blood pressure control, diabetes control, lipid control.

## 2022-10-27 NOTE — Assessment & Plan Note (Signed)
Controlled.  Continue gabapentin 300 mg in a.m. and 600 mg at bedtime.

## 2022-10-27 NOTE — Assessment & Plan Note (Signed)
Controlled with A1c of 6.3 today.  Continue glipizide XL 10 mg daily.  Follow-up in 6 months.

## 2022-10-27 NOTE — Assessment & Plan Note (Addendum)
Following with urology. Released from radiology oncology. Office notes and labs reviewed from April 2024.  PSA up-to-date.

## 2022-10-27 NOTE — Assessment & Plan Note (Signed)
Controlled.  Continue Zoloft 100 mg daily 

## 2022-10-27 NOTE — Assessment & Plan Note (Signed)
Reviewed remote pacer check from June 2024.

## 2022-10-27 NOTE — Assessment & Plan Note (Signed)
Repeat lipid panel pending. ? ?Continue rosuvastatin 20 mg daily. ?

## 2022-10-27 NOTE — Assessment & Plan Note (Signed)
Immunizations UTD. Colonoscopy UTD, due 2026 PSA UTD, follows with Urology  Discussed the importance of a healthy diet and regular exercise in order for weight loss, and to reduce the risk of further co-morbidity.  Exam stable. Labs pending.  Follow up in 1 year for repeat physical.

## 2022-10-27 NOTE — Progress Notes (Signed)
Subjective:    Patient ID: [name] Cook, male    DOB: 01/06/1951, 72 y.o.   MRN: [id number]  HPI  [name] Cook is a very pleasant 72 y.o. male who presents today for complete physical and follow up of chronic conditions.  Immunizations: -Tetanus: Completed in 2012 -Shingles: Never completed, declines  -Pneumonia: Completed Prevnar 13 in 2019, pneumovax 23 in 2012  Diet: Fair diet.  Exercise: No regular exercise.  Eye exam: Completes annually  Dental exam: Completed years ago  Colonoscopy: Completed in 2023, due 2026 Lung Cancer Screening: Completed in   PSA: UTD, follows with Urology.   BP Readings from Last 3 Encounters:  10/27/22 90/60  09/25/22 101/65  08/10/22 123/78   Wt Readings from Last 3 Encounters:  10/27/22 208 lb (94.3 kg)  10/10/22 205 lb (93 kg)  09/25/22 208 lb (94.3 kg)           Review of Systems  Constitutional:  Negative for unexpected weight change.  HENT:  Negative for rhinorrhea.   Respiratory:  Negative for cough and shortness of breath.   Cardiovascular:  Negative for chest pain.  Gastrointestinal:  Negative for constipation and diarrhea.  Genitourinary:  Negative for difficulty urinating.  Musculoskeletal:  Positive for arthralgias.  Skin:  Negative for rash.  Allergic/Immunologic: Negative for environmental allergies.  Neurological:  Positive for dizziness. Negative for headaches.  Psychiatric/Behavioral:  The patient is not nervous/anxious.          Past Medical History:  Diagnosis Date   Acute bronchitis with COPD (HCC) 03/07/2021   Acute pulmonary embolism (HCC) 03/08/2022   Arthritis    "knees and lower back" (03/14/2013)   Atrial flutter (HCC)    radiofrequency ablation in 2001   CAD (coronary artery disease)    a. Nonobstructive. Cardiac cath in 2001-50% mid RI, normal LM, LAD, RCA b. cath 10/16/2014 95% mid RCA treated with DES, 99% ost D1 medical management due to small aneurysmal segment   Chronic anticoagulation     chronic Coumadin anticoagulation   Chronic obstructive pulmonary disease (HCC) 04/20/2011   Diabetes mellitus, type 2 (HCC)    Diaphoresis 10/26/2021   Elevated lipase 06/23/2021   GERD (gastroesophageal reflux disease)    GIB (gastrointestinal bleeding) 03/08/2022   Hyperlipidemia    Hypertension    with hypertensive heart disease   Left knee pain 10/25/2017   medial   Obesity    Persistent atrial fibrillation (HCC)    recurrent atrial flutter since 2001 s/p DCCVs, multiple failed AADs, h/o tachy-mediated cardiomyopathy   Presence of Watchman left atrial appendage closure device 06/22/2022   Watchman  31mm FLX placed with Dr. Excell Seltzer   Shortness of breath    "can come on at any time" (03/14/2013)   Sleep apnea    "dx'd; couldn't wear the mask" (03/14/2013)   Symptomatic anemia 03/08/2022   Tobacco abuse    Urinary tract infection without hematuria 05/26/2022    Social History   Socioeconomic History   Marital status: Widowed    Spouse name: Not on file   Number of children: 1   Years of education: Not on file   Highest education level: Not on file  Occupational History   Occupation: Unemployed    Employer: UNEMPLOYED  Tobacco Use   Smoking status: Former    Current packs/day: 0.00    Average packs/day: 1 pack/day for 42.0 years (42.0 ttl pk-yrs)    Types: Cigarettes    Start date: 12/31/1971  Quit date: 12/30/2013    Years since quitting: 8.8   Smokeless tobacco: Never   Tobacco comments:    Former smoker 03/20/22  Vaping Use   Vaping status: Never Used  Substance and Sexual Activity   Alcohol use: Not Currently    Comment: 03/14/2013 "stopped drinking back in 2002; never had problem w/it"   Drug use: No   Sexual activity: Not Currently  Other Topics Concern   Not on file  Social History Narrative   Single.   Retired.    1 son, deceased.    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   Enjoys playing on the  computer.       Social Determinants of Health   Financial Resource Strain: Low Risk  (10/10/2022)   Overall Financial Resource Strain (CARDIA)    Difficulty of Paying Living Expenses: Not hard at all  Food Insecurity: No Food Insecurity (10/10/2022)   Hunger Vital Sign    Worried About Running Out of Food in the Last Year: Never true    Ran Out of Food in the Last Year: Never true  Transportation Needs: No Transportation Needs (10/10/2022)   PRAPARE - Administrator, Civil Service (Medical): No    Lack of Transportation (Non-Medical): No  Physical Activity: Insufficiently Active (10/10/2022)   Exercise Vital Sign    Days of Exercise per Week: 3 days    Minutes of Exercise per Session: 30 min  Stress: No Stress Concern Present (10/10/2022)   [employer] of Occupational Health - Occupational Stress Questionnaire    Feeling of Stress : Not at all  Social Connections: Socially Isolated (10/10/2022)   Social Connection and Isolation Panel [NHANES]    Frequency of Communication with Friends and Family: More than three times a week    Frequency of Social Gatherings with Friends and Family: Three times a week    Attends Religious Services: Never    Active Member of Clubs or Organizations: No    Attends Banker Meetings: Never    Marital Status: Widowed  Intimate Partner Violence: Not At Risk (10/10/2022)   Humiliation, Afraid, Rape, and Kick questionnaire    Fear of Current or Ex-Partner: No    Emotionally Abused: No    Physically Abused: No    Sexually Abused: No    Past Surgical History:  Procedure Laterality Date   ATRIAL FLUTTER ABLATION  2002   atrial flutter; subsequently developed atrial fibrillation   AV NODE ABLATION  01/24/2013   CARDIAC CATHETERIZATION  2002   CARDIAC CATHETERIZATION N/A 10/16/2014   Procedure: Left Heart Cath and Coronary Angiography;  Surgeon: Tonny Bollman, MD;  Location[facility] INVASIVE CV LAB;  Service: Cardiovascular;   Laterality: N/A;   CARDIOVERSION  05/31/2011   Procedure: CARDIOVERSION;  Surgeon: [name]. [name], MD;  Location: [facility] OR[facility];  Service: Cardiovascular;  Laterality: N/A;   CARPAL TUNNEL RELEASE Left 1980's   COLONOSCOPY WITH PROPOFOL N/A 12/30/2018   Procedure: COLONOSCOPY WITH PROPOFOL;  Surgeon: Wyline Mood, MD;  Location: [facility] ENDOSCOPY;  Service: Gastroenterology;  Laterality: N/A;   COLONOSCOPY WITH PROPOFOL N/A 12/04/2019   Procedure: COLONOSCOPY WITH PROPOFOL;  Surgeon: Wyline Mood, MD;  Location: [facility] ENDOSCOPY;  Service: Gastroenterology;  Laterality: N/A;   COLONOSCOPY WITH PROPOFOL N/A 06/13/2021   Procedure: COLONOSCOPY WITH PROPOFOL;  Surgeon: Wyline Mood, MD;  Location: [facility] ENDOSCOPY;  Service: Gastroenterology;  Laterality: N/A;   ESOPHAGOGASTRODUODENOSCOPY N/A 03/01/2022   Procedure: ESOPHAGOGASTRODUODENOSCOPY (EGD);  Surgeon: Vida Rigger, MD;  Location: [facility] ENDOSCOPY;  Service: Gastroenterology;  Laterality: N/A;   GIVENS CAPSULE STUDY Left 03/10/2022   Procedure: GIVENS CAPSULE STUDY;  Surgeon: Willis Modena, MD;  Location: [facility] ENDOSCOPY;  Service: Gastroenterology;  Laterality: Left;   INSERT / REPLACE / REMOVE PACEMAKER  01/24/2013    Medtronic Adapta L dual-chamber pacemaker, serial number [id number]    LEFT ATRIAL APPENDAGE OCCLUSION N/A 06/22/2022   Procedure: LEFT ATRIAL APPENDAGE OCCLUSION;  Surgeon: Tonny Bollman, MD;  Location: [facility] INVASIVE CV LAB;  Service: Cardiovascular;  Laterality: N/A;   LEFT HEART CATH AND CORONARY ANGIOGRAPHY N/A 06/07/2018   Procedure: LEFT HEART CATH AND CORONARY ANGIOGRAPHY;  Surgeon: Tonny Bollman, MD;  Location: [facility] INVASIVE CV LAB;  Service: Cardiovascular;  Laterality: N/A;   LEFT HEART CATHETERIZATION WITH CORONARY ANGIOGRAM N/A 03/17/2013   Procedure: LEFT HEART CATHETERIZATION WITH CORONARY ANGIOGRAM;  Surgeon: Kathleene Hazel, MD; LAD mild dz, D1 branch 100%, inferior branch 99%, CFX OK, RCA 50%, EF 65%     LOOP RECORDER IMPLANT   2002   PERMANENT PACEMAKER INSERTION N/A 01/24/2013   Procedure: PERMANENT PACEMAKER INSERTION;  Surgeon: Marinus Maw, MD;  Location: [facility] CATH LAB;  Service: Cardiovascular;  Laterality: N/A;   TEE WITHOUT CARDIOVERSION N/A 06/22/2022   Procedure: TRANSESOPHAGEAL ECHOCARDIOGRAM;  Surgeon: Tonny Bollman, MD;  Location: [facility] INVASIVE CV LAB;  Service: Cardiovascular;  Laterality: N/A;   TIBIAL TUBERCLERPLASTY  ~ 2003    Family History  Problem Relation Age of Onset   Alzheimer's disease Mother    Osteoporosis Mother     Allergies  Allergen Reactions   Januvia [Sitagliptin] Other (See Comments)    Elevated lipase   Metformin And Related Diarrhea    Current Outpatient Medications on File Prior to Visit  Medication Sig Dispense Refill   albuterol (VENTOLIN HFA) 108 (90 Base) MCG/ACT inhaler INHALE 2 PUFFS BY MOUTH EVERY 4 HOURS AS NEEDED FOR WHEEZE OR FOR SHORTNESS OF BREATH 8.5 each 0   Ascorbic Acid (VITAMIN C) 1000 MG tablet Take 2,000 mg by mouth daily.     blood glucose meter kit and supplies KIT Dispense based on patient and insurance preference. Use up to four times daily as directed. (FOR ICD-9 250.00, 250.01). 1 each 0   budesonide-formoterol (SYMBICORT) 160-4.5 MCG/ACT inhaler INHALE 2 PUFFS INTO THE LUNGS TWICE A DAY (Patient taking differently: Inhale 2 puffs into the lungs daily as needed (for shortness of breath and wheezing).) 10.2 each 5   clopidogrel (PLAVIX) 75 MG tablet Take 1 tablet (75 mg total) by mouth daily. START ON 422 90 tablet 3   diphenhydrAMINE (BENADRYL) 25 MG tablet Take 50 mg by mouth at bedtime as needed for itching.     fluticasone (FLONASE) 50 MCG/ACT nasal spray PLACE 1 SPRAY INTO BOTH NOSTRILS DAILY AS NEEDED FOR ALLERGIES OR RHINITIS. 48 mL 0   gabapentin (NEURONTIN) 300 MG capsule TAKE 1 CAPSULE BY MOUTH EVERY MORNING AND 2 CAPSULES BY MOUTH AT NIGHT FOR NEUROPATHY 270 capsule 3   glipiZIDE (GLUCOTROL XL) 10 MG 24 hr tablet TAKE 1 TABLET (10 MG TOTAL)  BY MOUTH DAILY WITH BREAKFAST. FOR DIABETES. 90 tablet 1   glucose blood (ACCU-CHEK GUIDE) test strip TEST FOUR TIMES A DAY 400 strip 0   isosorbide mononitrate (IMDUR) 30 MG 24 hr tablet Take 1 tablet (30 mg total) by mouth daily. 90 tablet 1   JARDIANCE 25 MG TABS tablet TAKE 1 TABLET (25 MG TOTAL) BY MOUTH DAILY  BEFORE BREAKFAST. FOR DIABETES. 90 tablet 1   lisinopril (ZESTRIL) 2.5 MG tablet TAKE 1 TABLET BY MOUTH EVERY DAY 90 tablet 3   nitroGLYCERIN (NITROSTAT) 0.4 MG SL tablet Place 1 tablet (0.4 mg total) under the tongue every 5 (five) minutes as needed for chest pain. Do not exceed 3 tablets in 24 hours. 25 tablet 0   rosuvastatin (CRESTOR) 20 MG tablet TAKE 1 TABLET BY MOUTH EVERY DAY IN THE EVENING FOR CHOLESTEROL 90 tablet 3   sertraline (ZOLOFT) 100 MG tablet TAKE 1 TABLET (100 MG TOTAL) BY MOUTH DAILY. FOR ANXIETY AND DEPRESSION. 90 tablet 0   tamsulosin (FLOMAX) 0.4 MG CAPS capsule TAKE 1 CAPSULE (0.4 MG TOTAL) BY MOUTH 2 (TWO) TIMES DAILY. 180 capsule 3   TYLENOL 500 MG tablet Take 1,000 mg by mouth daily.     pantoprazole (PROTONIX) 40 MG tablet Take 1 tablet (40 mg total) by mouth daily. 30 tablet 2   No current facility-administered medications on file prior to visit.    BP 90/60   Pulse 72   Temp 97.9 F (36.6 C) (Temporal)   Ht 5\' 11"  (1.803 m)   Wt 208 lb (94.3 kg)   SpO2 95%   BMI 29.01 kg/m  Objective:   Physical Exam HENT:     Right Ear: Tympanic membrane and ear canal normal.     Left Ear: Tympanic membrane and ear canal normal.     Nose: Nose normal.     Right Sinus: No maxillary sinus tenderness or frontal sinus tenderness.     Left Sinus: No maxillary sinus tenderness or frontal sinus tenderness.  Eyes:     Conjunctiva/sclera: Conjunctivae normal.  Neck:     Thyroid: No thyromegaly.     Vascular: No carotid bruit.  Cardiovascular:     Rate and Rhythm: Normal rate and regular rhythm.     Heart sounds: Normal heart sounds.  Pulmonary:     Effort:  Pulmonary effort is normal.     Breath sounds: Normal breath sounds. No wheezing or rales.  Abdominal:     General: Bowel sounds are normal.     Palpations: Abdomen is soft.     Tenderness: There is no abdominal tenderness.  Musculoskeletal:        General: Normal range of motion.     Cervical back: Neck supple.  Skin:    General: Skin is warm and dry.  Neurological:     Mental Status: He is alert and oriented to person, place, and time.     Cranial Nerves: No cranial nerve deficit.     Deep Tendon Reflexes: Reflexes are normal and symmetric.  Psychiatric:        Mood and Affect: Mood normal.           Assessment & Plan:  Preventative health care Assessment & Plan: Immunizations UTD. Colonoscopy UTD, due 2026 PSA UTD, follows with Urology  Discussed the importance of a healthy diet and regular exercise in order for weight loss, and to reduce the risk of further co-morbidity.  Exam stable. Labs pending.  Follow up in 1 year for repeat physical.    Type 2 diabetes mellitus with hyperglycemia, without long-term current use of insulin (HCC) Assessment & Plan: Controlled with A1c of 6.3 today.  Continue glipizide XL 10 mg daily.  Follow-up in 6 months.  Orders: -     POCT glycosylated hemoglobin (Hb A1C)  Tremor of both hands Assessment & Plan: No improvement with propranolol. Will  discontinue. He agrees.   Hyperlipidemia, unspecified hyperlipidemia type Assessment & Plan: Repeat lipid panel pending.  Continue rosuvastatin 20 mg daily.  Orders: -     Lipid panel -     Comprehensive metabolic panel  Atrial fibrillation, unspecified type [facility]) Assessment & Plan: Regular rate and rhythm today.  Remain off of Eliquis. Continue Plavix 75 mg daily.     Coronary artery disease involving native coronary artery of native heart with angina pectoris [facility]) Assessment & Plan: Asymptomatic.  Plavix 75 mg daily, blood pressure control, diabetes control,  lipid control.   Primary hypertension Assessment & Plan: Hypotensive over the last 2 documented readings.  I have asked that he start monitoring blood pressure at home.  Discontinue propranolol as this has been ineffective for his tremors and may be contributing to his hypotension. He will call us with blood pressure readings in 2 weeks.  Continue lisinopril 2.5 mg daily for now which is mostly for heart protection   Pulmonary emphysema, unspecified emphysema type (HCC) Assessment & Plan: Controlled.  Continue albuterol inhaler as needed for which he uses infrequently.   Gastroesophageal reflux disease, unspecified whether esophagitis present Assessment & Plan: Controlled.  Continue pantoprazole 40 daily.   Neuropathy due to secondary diabetes [facility]) Assessment & Plan: Controlled.  Continue gabapentin 300 mg in a.m. and 600 mg at bedtime.   Anxiety and depression Assessment & Plan: Controlled.  Continue Zoloft 100 mg daily.   History of prostate cancer Assessment & Plan: Following with urology. Released from radiology oncology. Office notes and labs reviewed from April 2024.  PSA up-to-date.   Pacemaker Assessment & Plan: Reviewed remote pacer check from June 2024.         [name], NP

## 2022-10-27 NOTE — Assessment & Plan Note (Signed)
No improvement with propranolol. Will discontinue. He agrees.

## 2022-10-27 NOTE — Assessment & Plan Note (Signed)
Hypotensive over the last 2 documented readings.  I have asked that he start monitoring blood pressure at home.  Discontinue propranolol as this has been ineffective for his tremors and may be contributing to his hypotension. He will call us with blood pressure readings in 2 weeks.  Continue lisinopril 2.5 mg daily for now which is mostly for heart protection

## 2022-10-27 NOTE — Assessment & Plan Note (Signed)
Controlled.  Continue pantoprazole 40 daily.

## 2022-10-27 NOTE — Patient Instructions (Signed)
Stop taking propranolol for your hand tremors.   Start monitoring your blood pressure daily, around the same time of day, for the next 2-3 weeks.  Ensure that you have rested for 30 minutes prior to checking your blood pressure.   Record your readings and call me with readings in 2 weeks.   Stop by the lab prior to leaving today. I will notify you of your results once received.   Please schedule a follow up visit for 6 months for a diabetes check.  It was a pleasure to see you today!

## 2022-10-27 NOTE — Assessment & Plan Note (Signed)
Regular rate and rhythm today.  Remain off of Eliquis. Continue Plavix 75 mg daily.

## 2022-11-06 ENCOUNTER — Emergency Department (HOSPITAL_COMMUNITY): Payer: 59

## 2022-11-06 ENCOUNTER — Other Ambulatory Visit: Payer: Self-pay

## 2022-11-06 ENCOUNTER — Encounter (HOSPITAL_COMMUNITY): Payer: Self-pay

## 2022-11-06 ENCOUNTER — Emergency Department (HOSPITAL_COMMUNITY)
Admission: EM | Admit: 2022-11-06 | Discharge: 2022-11-06 | Disposition: A | Discharge status: Home / Self Care | Payer: 59 | Source: Home / Self Care | Attending: Emergency Medicine | Admitting: Emergency Medicine

## 2022-11-06 DIAGNOSIS — R42 Dizziness and giddiness: Secondary | ICD-10-CM | POA: Insufficient documentation

## 2022-11-06 DIAGNOSIS — Z7901 Long term (current) use of anticoagulants: Secondary | ICD-10-CM | POA: Diagnosis not present

## 2022-11-06 DIAGNOSIS — I700 Atherosclerosis of aorta: Secondary | ICD-10-CM | POA: Diagnosis not present

## 2022-11-06 DIAGNOSIS — Z950 Presence of cardiac pacemaker: Secondary | ICD-10-CM | POA: Diagnosis not present

## 2022-11-06 DIAGNOSIS — R918 Other nonspecific abnormal finding of lung field: Secondary | ICD-10-CM | POA: Diagnosis not present

## 2022-11-06 DIAGNOSIS — R0602 Shortness of breath: Secondary | ICD-10-CM | POA: Diagnosis not present

## 2022-11-06 LAB — CBC
HCT: 43.4 % (ref 39.0–52.0)
Hemoglobin: 13.8 g/dL (ref 13.0–17.0)
MCH: 30.1 pg (ref 26.0–34.0)
MCHC: 31.8 g/dL (ref 30.0–36.0)
MCV: 94.8 fL (ref 80.0–100.0)
Platelets: 123 10*3/uL — ABNORMAL LOW (ref 150–400)
RBC: 4.58 MIL/uL (ref 4.22–5.81)
RDW: 16 % — ABNORMAL HIGH (ref 11.5–15.5)
WBC: 4.9 10*3/uL (ref 4.0–10.5)
nRBC: 0 % (ref 0.0–0.2)

## 2022-11-06 LAB — TROPONIN I (HIGH SENSITIVITY)
Troponin I (High Sensitivity): 6 ng/L (ref <18)
Troponin I (High Sensitivity): 6 ng/L (ref <18)

## 2022-11-06 LAB — BASIC METABOLIC PANEL
Anion gap: 10 (ref 5–15)
BUN: 18 mg/dL (ref 8–23)
CO2: 23 mmol/L (ref 22–32)
Calcium: 9.2 mg/dL (ref 8.9–10.3)
Chloride: 105 mmol/L (ref 98–111)
Creatinine, Ser: 1.06 mg/dL (ref 0.61–1.24)
GFR, Estimated: >60 mL/min (ref >60)
Glucose, Bld: 97 mg/dL (ref 70–99)
Potassium: 4 mmol/L (ref 3.5–5.1)
Sodium: 138 mmol/L (ref 135–145)

## 2022-11-06 NOTE — ED Provider Notes (Signed)
[place] [facility] DEPARTMENT AT [facility] Provider Note   CSN: [id number] Arrival date & time: 11/06/22  1613     History {Add pertinent medical, surgical, social history, OB history to HPI:1} No chief complaint on file.   [name] Cook is a 72 y.o. male.  He is here with complaint of dizziness that is been going on for a few weeks.  He said he was visiting a friend today in the hospital and walking from the elevator to the room he began experiencing dizziness lightheadedness.  It was associated with some shortness of breath.  He said he feels like when he had low blood count in the past.  He has seen his PCP for this and they took him off of one of his medications for his tremor.  It does not seem to have helped his dizziness.  He denies any falls or syncope.  He feels he has been eating and drinking well.  The history is provided by the patient.  Dizziness Quality:  Lightheadedness Severity:  Moderate Onset quality:  Gradual Duration:  2 weeks Timing:  Intermittent Progression:  Unchanged Context: physical activity   Relieved by:  None tried Associated symptoms: shortness of breath   Associated symptoms: no blood in stool, no chest pain, no headaches, no nausea and no vomiting        Home Medications Prior to Admission medications   Medication Sig Start Date End Date Taking? Authorizing Provider  albuterol (VENTOLIN HFA) 108 (90 Base) MCG/ACT inhaler INHALE 2 PUFFS BY MOUTH EVERY 4 HOURS AS NEEDED FOR WHEEZE OR FOR SHORTNESS OF BREATH 08/29/22   [name], NP  Ascorbic Acid (VITAMIN C) 1000 MG tablet Take 2,000 mg by mouth daily.    [provider]  blood glucose meter kit and supplies KIT Dispense based on patient and insurance preference. Use up to four times daily as directed. (FOR ICD-9 250.00, 250.01). 04/08/21   [name], NP  budesonide-formoterol (SYMBICORT) 160-4.5 MCG/ACT inhaler INHALE 2 PUFFS INTO THE LUNGS TWICE A DAY Patient  taking differently: Inhale 2 puffs into the lungs daily as needed (for shortness of breath and wheezing). 01/12/21   [name], NP  clopidogrel (PLAVIX) 75 MG tablet Take 1 tablet (75 mg total) by mouth daily. START ON 422 07/24/22   Georgie Chard D, NP  diphenhydrAMINE (BENADRYL) 25 MG tablet Take 50 mg by mouth at bedtime as needed for itching.    [provider]  fluticasone (FLONASE) 50 MCG/ACT nasal spray PLACE 1 SPRAY INTO BOTH NOSTRILS DAILY AS NEEDED FOR ALLERGIES OR RHINITIS. 07/06/22   [name], NP  gabapentin (NEURONTIN) 300 MG capsule TAKE 1 CAPSULE BY MOUTH EVERY MORNING AND 2 CAPSULES BY MOUTH AT NIGHT FOR NEUROPATHY 11/18/21   [name], NP  glipiZIDE (GLUCOTROL XL) 10 MG 24 hr tablet TAKE 1 TABLET (10 MG TOTAL) BY MOUTH DAILY WITH BREAKFAST. FOR DIABETES. 06/06/22   [name], NP  glucose blood (ACCU-CHEK GUIDE) test strip TEST FOUR TIMES A DAY 09/04/22   [name], NP  isosorbide mononitrate (IMDUR) 30 MG 24 hr tablet Take 1 tablet (30 mg total) by mouth daily. 05/09/22   Dyann Kief, PA-C  JARDIANCE 25 MG TABS tablet TAKE 1 TABLET (25 MG TOTAL) BY MOUTH DAILY BEFORE BREAKFAST. FOR DIABETES. 06/15/22   [name], NP  lisinopril (ZESTRIL) 2.5 MG tablet TAKE 1 TABLET BY MOUTH EVERY DAY 07/03/22   Ladona Ridgel,  Doylene Canning, MD  nitroGLYCERIN (NITROSTAT) 0.4 MG SL tablet Place 1 tablet (0.4 mg total) under the tongue every 5 (five) minutes as needed for chest pain. Do not exceed 3 tablets in 24 hours. 05/09/22   [name], NP  pantoprazole (PROTONIX) 40 MG tablet Take 1 tablet (40 mg total) by mouth daily. 03/12/22 10/10/22  Joseph Art, DO  rosuvastatin (CRESTOR) 20 MG tablet TAKE 1 TABLET BY MOUTH EVERY DAY IN THE EVENING FOR CHOLESTEROL 11/24/21   [name], NP  sertraline (ZOLOFT) 100 MG tablet TAKE 1 TABLET (100 MG TOTAL) BY MOUTH DAILY. FOR ANXIETY AND DEPRESSION. 10/08/22   [name], NP  tamsulosin (FLOMAX)  0.4 MG CAPS capsule TAKE 1 CAPSULE (0.4 MG TOTAL) BY MOUTH 2 (TWO) TIMES DAILY. 01/12/21   Carmina Miller, MD  TYLENOL 500 MG tablet Take 1,000 mg by mouth daily.    [provider]      Allergies    Januvia [sitagliptin] and Metformin and related    Review of Systems   Review of Systems  Constitutional:  Negative for fever.  Eyes:  Negative for visual disturbance.  Respiratory:  Positive for shortness of breath.   Cardiovascular:  Negative for chest pain.  Gastrointestinal:  Negative for blood in stool, nausea and vomiting.  Neurological:  Positive for dizziness. Negative for headaches.    Physical Exam Updated Vital Signs BP (!) 147/88 (BP Location: Right Arm)   Pulse 86   Temp (!) 97.4 F (36.3 C) (Oral)   Resp 16   SpO2 99%  Physical Exam Vitals and nursing note reviewed.  Constitutional:      General: He is not in acute distress.    Appearance: Normal appearance. He is well-developed.  HENT:     Head: Normocephalic and atraumatic.  Eyes:     Conjunctiva/sclera: Conjunctivae normal.  Cardiovascular:     Rate and Rhythm: Normal rate and regular rhythm.     Heart sounds: No murmur heard. Pulmonary:     Effort: Pulmonary effort is normal. No respiratory distress.     Breath sounds: Normal breath sounds.  Abdominal:     Palpations: Abdomen is soft.     Tenderness: There is no abdominal tenderness. There is no guarding or rebound.  Musculoskeletal:        General: No deformity. Normal range of motion.     Cervical back: Neck supple.     Right lower leg: No edema.     Left lower leg: No edema.  Skin:    General: Skin is warm and dry.     Capillary Refill: Capillary refill takes less than 2 seconds.  Neurological:     General: No focal deficit present.     Mental Status: He is alert and oriented to person, place, and time.     Sensory: No sensory deficit.     Motor: No weakness.     ED Results / Procedures / Treatments   Labs (all labs ordered are  listed, but only abnormal results are displayed) Labs Reviewed  CBC - Abnormal; Notable for the following components:      Result Value   RDW 16.0 (*)    Platelets 123 (*)    All other components within normal limits  BASIC METABOLIC PANEL  TROPONIN I (HIGH SENSITIVITY)  TROPONIN I (HIGH SENSITIVITY)    EKG EKG Interpretation Date/Time:  Monday November 06 2022 16:35:44 EDT Ventricular Rate:  83 PR Interval:    QRS  Duration:  182 QT Interval:  460 QTC Calculation: 540 R Axis:   -72  Text Interpretation: Ventricular-paced rhythm Abnormal ECG When compared with ECG of 22-Jun-2022 12:15, No significant change since prior 3/24 Confirmed by Meridee Score [id number]) on 11/06/2022 7:20:37 PM  Radiology DG Chest 2 View  Result Date: 11/06/2022 CLINICAL DATA:  Shortness of breath EXAM: CHEST - 2 VIEW COMPARISON:  Chest x-ray 06/22/2022 FINDINGS: Left chest battery pack with pacemaker leads along the right side of the heart. No consolidation, pneumothorax or effusion. Mild linear opacity left lung base likely scar or atelectasis. No edema. Normal cardiopericardial silhouette. Calcified aorta. Degenerative changes of the spine. IMPRESSION: Pacemaker. Slight linear opacity left lung base likely scar or atelectasis. Electronically Signed   By: Karen Kays M.D.   On: 11/06/2022 17:05    Procedures Procedures  {Document cardiac monitor, telemetry assessment procedure when appropriate:1}  Medications Ordered in ED Medications - No data to display  ED Course/ Medical Decision Making/ A&P   {   Click here for ABCD2, HEART and other calculatorsREFRESH Note before signing :1}                          Medical Decision Making Amount and/or Complexity of Data Reviewed Labs: ordered. Radiology: ordered.   This patient complains of ***; this involves an extensive number of treatment Options and is a complaint that carries with it a high risk of complications and morbidity. The differential  includes ***  I ordered, reviewed and interpreted labs, which included *** I ordered medication *** and reviewed PMP when indicated. I ordered imaging studies which included *** and I independently    visualized and interpreted imaging which showed *** Additional history obtained from *** Previous records obtained and reviewed *** I consulted *** and discussed lab and imaging findings and discussed disposition.  Cardiac monitoring reviewed, *** Social determinants considered, *** Critical Interventions: ***  After the interventions stated above, I reevaluated the patient and found *** Admission and further testing considered, ***   {Document critical care time when appropriate:1} {Document review of labs and clinical decision tools ie heart score, Chads2Vasc2 etc:1}  {Document your independent review of radiology images, and any outside records:1} {Document your discussion with family members, caretakers, and with consultants:1} {Document social determinants of health affecting pt's care:1} {Document your decision making why or why not admission, treatments were needed:1} Final Clinical Impression(s) / ED Diagnoses Final diagnoses:  None    Rx / DC Orders ED Discharge Orders     None

## 2022-11-06 NOTE — ED Triage Notes (Signed)
Pt c/o dizziness intermittently x couple of weeks; pt was here visiting someone today, felt like we was going to pass out after walking from elevator; states he felt like this was when he was anemic; denies pain; endorses some sob; hx afib PE, on plavix; denies bloody stools

## 2022-11-06 NOTE — ED Notes (Signed)
Ambulatory pulse ox 97%. Provider made aware.

## 2022-11-06 NOTE — Discharge Instructions (Signed)
You were seen in the emergency department for an episode of dizziness.  You had blood work chest x-ray EKG that did not show an obvious explanation for your symptoms.  Please monitor your blood pressure and continue regular medications.  Follow-up with your primary care doctor and your cardiology team.  Return to the emergency department if any worsening or concerning symptoms.

## 2022-11-27 ENCOUNTER — Other Ambulatory Visit: Payer: Self-pay | Admitting: Primary Care

## 2022-11-27 DIAGNOSIS — J309 Allergic rhinitis, unspecified: Secondary | ICD-10-CM

## 2022-11-28 ENCOUNTER — Other Ambulatory Visit: Payer: Self-pay | Admitting: Primary Care

## 2022-11-28 DIAGNOSIS — E1142 Type 2 diabetes mellitus with diabetic polyneuropathy: Secondary | ICD-10-CM

## 2022-11-29 ENCOUNTER — Other Ambulatory Visit: Payer: Self-pay | Admitting: Primary Care

## 2022-11-29 DIAGNOSIS — E1165 Type 2 diabetes mellitus with hyperglycemia: Secondary | ICD-10-CM

## 2022-12-13 ENCOUNTER — Ambulatory Visit: Payer: 59

## 2022-12-20 ENCOUNTER — Encounter (HOSPITAL_COMMUNITY): Payer: Self-pay

## 2022-12-20 ENCOUNTER — Other Ambulatory Visit: Payer: Self-pay

## 2022-12-20 ENCOUNTER — Ambulatory Visit: Payer: 59 | Attending: Cardiology | Admitting: Physician Assistant

## 2022-12-20 ENCOUNTER — Inpatient Hospital Stay (HOSPITAL_COMMUNITY)
Admission: EM | Admit: 2022-12-20 | Discharge: 2022-12-25 | DRG: 378 | Disposition: A | Discharge status: Home / Self Care | Payer: 59 | Attending: Internal Medicine | Admitting: Internal Medicine

## 2022-12-20 ENCOUNTER — Emergency Department (HOSPITAL_COMMUNITY): Payer: 59

## 2022-12-20 VITALS — BP 126/80 | HR 85 | Ht 71.0 in | Wt 204.0 lb

## 2022-12-20 DIAGNOSIS — R079 Chest pain, unspecified: Secondary | ICD-10-CM | POA: Diagnosis not present

## 2022-12-20 DIAGNOSIS — D62 Acute posthemorrhagic anemia: Secondary | ICD-10-CM | POA: Diagnosis not present

## 2022-12-20 DIAGNOSIS — Z7951 Long term (current) use of inhaled steroids: Secondary | ICD-10-CM | POA: Diagnosis not present

## 2022-12-20 DIAGNOSIS — Z86711 Personal history of pulmonary embolism: Secondary | ICD-10-CM

## 2022-12-20 DIAGNOSIS — K449 Diaphragmatic hernia without obstruction or gangrene: Secondary | ICD-10-CM | POA: Diagnosis present

## 2022-12-20 DIAGNOSIS — Z7902 Long term (current) use of antithrombotics/antiplatelets: Secondary | ICD-10-CM

## 2022-12-20 DIAGNOSIS — K264 Chronic or unspecified duodenal ulcer with hemorrhage: Secondary | ICD-10-CM | POA: Diagnosis not present

## 2022-12-20 DIAGNOSIS — R6889 Other general symptoms and signs: Secondary | ICD-10-CM | POA: Diagnosis not present

## 2022-12-20 DIAGNOSIS — K920 Hematemesis: Secondary | ICD-10-CM | POA: Diagnosis not present

## 2022-12-20 DIAGNOSIS — I959 Hypotension, unspecified: Secondary | ICD-10-CM | POA: Diagnosis present

## 2022-12-20 DIAGNOSIS — E8721 Acute metabolic acidosis: Secondary | ICD-10-CM | POA: Diagnosis not present

## 2022-12-20 DIAGNOSIS — I10 Essential (primary) hypertension: Secondary | ICD-10-CM

## 2022-12-20 DIAGNOSIS — Z955 Presence of coronary angioplasty implant and graft: Secondary | ICD-10-CM

## 2022-12-20 DIAGNOSIS — E785 Hyperlipidemia, unspecified: Secondary | ICD-10-CM | POA: Diagnosis present

## 2022-12-20 DIAGNOSIS — I2699 Other pulmonary embolism without acute cor pulmonale: Secondary | ICD-10-CM

## 2022-12-20 DIAGNOSIS — I2510 Atherosclerotic heart disease of native coronary artery without angina pectoris: Secondary | ICD-10-CM | POA: Diagnosis not present

## 2022-12-20 DIAGNOSIS — E1165 Type 2 diabetes mellitus with hyperglycemia: Secondary | ICD-10-CM

## 2022-12-20 DIAGNOSIS — Z820 Family history of epilepsy and other diseases of the nervous system: Secondary | ICD-10-CM | POA: Diagnosis not present

## 2022-12-20 DIAGNOSIS — Z23 Encounter for immunization: Secondary | ICD-10-CM

## 2022-12-20 DIAGNOSIS — K922 Gastrointestinal hemorrhage, unspecified: Secondary | ICD-10-CM

## 2022-12-20 DIAGNOSIS — D696 Thrombocytopenia, unspecified: Secondary | ICD-10-CM | POA: Diagnosis present

## 2022-12-20 DIAGNOSIS — Z743 Need for continuous supervision: Secondary | ICD-10-CM | POA: Diagnosis not present

## 2022-12-20 DIAGNOSIS — R1111 Vomiting without nausea: Secondary | ICD-10-CM | POA: Diagnosis not present

## 2022-12-20 DIAGNOSIS — Z950 Presence of cardiac pacemaker: Secondary | ICD-10-CM

## 2022-12-20 DIAGNOSIS — K2100 Gastro-esophageal reflux disease with esophagitis, without bleeding: Secondary | ICD-10-CM | POA: Diagnosis present

## 2022-12-20 DIAGNOSIS — J449 Chronic obstructive pulmonary disease, unspecified: Secondary | ICD-10-CM | POA: Diagnosis not present

## 2022-12-20 DIAGNOSIS — E119 Type 2 diabetes mellitus without complications: Secondary | ICD-10-CM | POA: Diagnosis not present

## 2022-12-20 DIAGNOSIS — N179 Acute kidney failure, unspecified: Secondary | ICD-10-CM | POA: Diagnosis not present

## 2022-12-20 DIAGNOSIS — Z87891 Personal history of nicotine dependence: Secondary | ICD-10-CM

## 2022-12-20 DIAGNOSIS — I4891 Unspecified atrial fibrillation: Secondary | ICD-10-CM

## 2022-12-20 DIAGNOSIS — K5730 Diverticulosis of large intestine without perforation or abscess without bleeding: Secondary | ICD-10-CM | POA: Diagnosis not present

## 2022-12-20 DIAGNOSIS — E875 Hyperkalemia: Secondary | ICD-10-CM | POA: Diagnosis present

## 2022-12-20 DIAGNOSIS — I4819 Other persistent atrial fibrillation: Secondary | ICD-10-CM | POA: Diagnosis present

## 2022-12-20 DIAGNOSIS — K269 Duodenal ulcer, unspecified as acute or chronic, without hemorrhage or perforation: Secondary | ICD-10-CM

## 2022-12-20 DIAGNOSIS — I252 Old myocardial infarction: Secondary | ICD-10-CM | POA: Diagnosis not present

## 2022-12-20 DIAGNOSIS — K209 Esophagitis, unspecified without bleeding: Secondary | ICD-10-CM | POA: Diagnosis not present

## 2022-12-20 DIAGNOSIS — Z79899 Other long term (current) drug therapy: Secondary | ICD-10-CM

## 2022-12-20 DIAGNOSIS — I129 Hypertensive chronic kidney disease with stage 1 through stage 4 chronic kidney disease, or unspecified chronic kidney disease: Secondary | ICD-10-CM | POA: Diagnosis not present

## 2022-12-20 DIAGNOSIS — I7143 Infrarenal abdominal aortic aneurysm, without rupture: Secondary | ICD-10-CM | POA: Diagnosis not present

## 2022-12-20 DIAGNOSIS — R1030 Lower abdominal pain, unspecified: Secondary | ICD-10-CM | POA: Diagnosis not present

## 2022-12-20 DIAGNOSIS — G4733 Obstructive sleep apnea (adult) (pediatric): Secondary | ICD-10-CM | POA: Diagnosis not present

## 2022-12-20 DIAGNOSIS — I4892 Unspecified atrial flutter: Secondary | ICD-10-CM | POA: Diagnosis not present

## 2022-12-20 DIAGNOSIS — N182 Chronic kidney disease, stage 2 (mild): Secondary | ICD-10-CM | POA: Diagnosis not present

## 2022-12-20 DIAGNOSIS — Z7982 Long term (current) use of aspirin: Secondary | ICD-10-CM

## 2022-12-20 DIAGNOSIS — R58 Hemorrhage, not elsewhere classified: Secondary | ICD-10-CM | POA: Diagnosis not present

## 2022-12-20 DIAGNOSIS — Z95818 Presence of other cardiac implants and grafts: Secondary | ICD-10-CM | POA: Diagnosis not present

## 2022-12-20 DIAGNOSIS — Z7984 Long term (current) use of oral hypoglycemic drugs: Secondary | ICD-10-CM

## 2022-12-20 DIAGNOSIS — Z8262 Family history of osteoporosis: Secondary | ICD-10-CM

## 2022-12-20 DIAGNOSIS — K8020 Calculus of gallbladder without cholecystitis without obstruction: Secondary | ICD-10-CM | POA: Diagnosis not present

## 2022-12-20 HISTORY — DX: Gastrointestinal hemorrhage, unspecified: K92.2

## 2022-12-20 LAB — COMPREHENSIVE METABOLIC PANEL
ALT: 19 U/L (ref 0–44)
AST: 18 U/L (ref 15–41)
Albumin: 3.3 g/dL — ABNORMAL LOW (ref 3.5–5.0)
Alkaline Phosphatase: 40 U/L (ref 38–126)
Anion gap: 11 (ref 5–15)
BUN: 34 mg/dL — ABNORMAL HIGH (ref 8–23)
CO2: 20 mmol/L — ABNORMAL LOW (ref 22–32)
Calcium: 8.6 mg/dL — ABNORMAL LOW (ref 8.9–10.3)
Chloride: 106 mmol/L (ref 98–111)
Creatinine, Ser: 1.18 mg/dL (ref 0.61–1.24)
GFR, Estimated: >60 mL/min (ref >60)
Glucose, Bld: 144 mg/dL — ABNORMAL HIGH (ref 70–99)
Potassium: 4.5 mmol/L (ref 3.5–5.1)
Sodium: 137 mmol/L (ref 135–145)
Total Bilirubin: 0.4 mg/dL (ref 0.3–1.2)
Total Protein: 5.3 g/dL — ABNORMAL LOW (ref 6.5–8.1)

## 2022-12-20 LAB — CBC WITH DIFFERENTIAL/PLATELET
Abs Immature Granulocytes: 0.03 10*3/uL (ref 0.00–0.07)
Basophils Absolute: 0 10*3/uL (ref 0.0–0.1)
Basophils Relative: 0 %
Eosinophils Absolute: 0.1 10*3/uL (ref 0.0–0.5)
Eosinophils Relative: 1 %
HCT: 39 % (ref 39.0–52.0)
Hemoglobin: 12.7 g/dL — ABNORMAL LOW (ref 13.0–17.0)
Immature Granulocytes: 0 %
Lymphocytes Relative: 10 %
Lymphs Abs: 0.8 10*3/uL (ref 0.7–4.0)
MCH: 31.4 pg (ref 26.0–34.0)
MCHC: 32.6 g/dL (ref 30.0–36.0)
MCV: 96.3 fL (ref 80.0–100.0)
Monocytes Absolute: 0.6 10*3/uL (ref 0.1–1.0)
Monocytes Relative: 8 %
Neutro Abs: 6.1 10*3/uL (ref 1.7–7.7)
Neutrophils Relative %: 81 %
Platelets: 115 10*3/uL — ABNORMAL LOW (ref 150–400)
RBC: 4.05 MIL/uL — ABNORMAL LOW (ref 4.22–5.81)
RDW: 14.6 % (ref 11.5–15.5)
WBC: 7.6 10*3/uL (ref 4.0–10.5)
nRBC: 0 % (ref 0.0–0.2)

## 2022-12-20 LAB — LIPASE, BLOOD: Lipase: 35 U/L (ref 11–51)

## 2022-12-20 LAB — POC OCCULT BLOOD, ED: Fecal Occult Bld: NEGATIVE

## 2022-12-20 MED ORDER — ISOSORBIDE MONONITRATE ER 60 MG PO TB24: 60.0000 mg | ORAL_TABLET | Freq: Every day | ORAL | 3 refills | Status: DC

## 2022-12-20 MED ORDER — IOHEXOL 350 MG/ML SOLN
75.0000 mL | Freq: Once | INTRAVENOUS | Status: AC | PRN
  Administered 2022-12-20: 75 mL via INTRAVENOUS

## 2022-12-20 MED ORDER — PANTOPRAZOLE SODIUM 40 MG IV SOLR
40.0000 mg | Freq: Once | INTRAVENOUS | Status: AC
  Administered 2022-12-20: 40 mg via INTRAVENOUS
  Filled 2022-12-20: qty 10

## 2022-12-20 MED ORDER — ASPIRIN 81 MG PO TBEC: 81.0000 mg | DELAYED_RELEASE_TABLET | Freq: Every day | ORAL | 3 refills | Status: DC

## 2022-12-20 MED ORDER — PANTOPRAZOLE INFUSION (NEW) - SIMPLE MED
8.0000 mg/h | INTRAVENOUS | Status: AC
  Administered 2022-12-20 – 2022-12-23 (×7): 8 mg/h via INTRAVENOUS
  Filled 2022-12-20 (×8): qty 100

## 2022-12-20 MED ORDER — PANTOPRAZOLE SODIUM 40 MG IV SOLR
40.0000 mg | Freq: Two times a day (BID) | INTRAVENOUS | Status: DC
  Administered 2022-12-24 – 2022-12-25 (×3): 40 mg via INTRAVENOUS
  Filled 2022-12-20 (×3): qty 10

## 2022-12-20 MED ORDER — ONDANSETRON HCL 4 MG/2ML IJ SOLN
4.0000 mg | Freq: Once | INTRAMUSCULAR | Status: AC
  Administered 2022-12-20: 4 mg via INTRAVENOUS
  Filled 2022-12-20: qty 2

## 2022-12-20 MED ORDER — SODIUM CHLORIDE 0.9 % IV BOLUS
1000.0000 mL | Freq: Once | INTRAVENOUS | Status: AC
  Administered 2022-12-20: 1000 mL via INTRAVENOUS

## 2022-12-20 MED ORDER — SODIUM CHLORIDE 0.9 % IV SOLN: INTRAVENOUS | Status: DC

## 2022-12-20 NOTE — ED Triage Notes (Signed)
Patient BIB [facility] from home due to hematemesis. Patient states he started throwing up this evening and has been dark in color. Patient recently switched off of plavix to baby aspirin. Patient has no complaints of nausea at this time. 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 Received 450 mL of NS. Patient A&Ox4.

## 2022-12-20 NOTE — ED Provider Notes (Signed)
[place] [facility]MENT AT [facility] Provider Note   CSN: [id number] Arrival date & time: 12/20/22  2048     History  Chief Complaint  Patient presents with   Hematemesis    [name] Cook is a 72 y.o. male with past medical history significant for Afib, tobacco abuse, GERD, type 2 diabetes, presence of Watchman, GI bleed, CAD, hypertension presents to the ED complaining abdominal pain and hematemesis.  Patient states he has been on Plavix, however, he saw his cardiologist earlier today and was told to stop taking Plavix and switch to baby aspirin daily.  Endorses diarrhea and fatigue also.  Denies hematochezia, melena, syncope.       Home Medications Prior to Admission medications   Medication Sig Start Date End Date Taking? Authorizing Provider  albuterol (VENTOLIN HFA) 108 (90 Base) MCG/ACT inhaler INHALE 2 PUFFS BY MOUTH EVERY 4 HOURS AS NEEDED FOR WHEEZE OR FOR SHORTNESS OF BREATH 08/29/22  Yes [name], NP  Ascorbic Acid (VITAMIN C) 1000 MG tablet Take 2,000 mg by mouth daily.   Yes [provider]  budesonide-formoterol (SYMBICORT) 160-4.5 MCG/ACT inhaler INHALE 2 PUFFS INTO THE LUNGS TWICE A DAY Patient taking differently: Inhale 2 puffs into the lungs daily as needed (for shortness of breath and wheezing). 01/12/21  Yes [name], NP  fluticasone (FLONASE) 50 MCG/ACT nasal spray PLACE 1 SPRAY INTO BOTH NOSTRILS DAILY AS NEEDED FOR ALLERGIES OR RHINITIS. 11/27/22  Yes [name], NP  gabapentin (NEURONTIN) 300 MG capsule TAKE 1 CAPSULE BY MOUTH EVERY MORNING AND 2 CAPSULES BY MOUTH AT NIGHT FOR NEUROPATHY 11/28/22  Yes [name], NP  glipiZIDE (GLUCOTROL XL) 10 MG 24 hr tablet TAKE 1 TABLET (10 MG TOTAL) BY MOUTH DAILY WITH BREAKFAST. FOR DIABETES. 11/29/22  Yes [name], NP  isosorbide mononitrate (IMDUR) 60 MG 24 hr tablet Take 1 tablet (60 mg total) by mouth daily. 12/20/22  Yes Cline Crock R, PA-C   JARDIANCE 25 MG TABS tablet TAKE 1 TABLET (25 MG TOTAL) BY MOUTH DAILY BEFORE BREAKFAST. FOR DIABETES. 06/15/22  Yes [name], NP  lisinopril (ZESTRIL) 2.5 MG tablet TAKE 1 TABLET BY MOUTH EVERY DAY 07/03/22  Yes Marinus Maw, MD  nitroGLYCERIN (NITROSTAT) 0.4 MG SL tablet Place 1 tablet (0.4 mg total) under the tongue every 5 (five) minutes as needed for chest pain. Do not exceed 3 tablets in 24 hours. 05/09/22  Yes [name], NP  pantoprazole (PROTONIX) 40 MG tablet Take 1 tablet (40 mg total) by mouth daily. 03/12/22 12/20/22 Yes Vann, Jessica U, DO  rosuvastatin (CRESTOR) 20 MG tablet TAKE 1 TABLET BY MOUTH EVERY DAY IN THE EVENING FOR CHOLESTEROL 11/24/21  Yes [name], NP  sertraline (ZOLOFT) 100 MG tablet TAKE 1 TABLET (100 MG TOTAL) BY MOUTH DAILY. FOR ANXIETY AND DEPRESSION. 10/08/22  Yes [name], NP  tamsulosin (FLOMAX) 0.4 MG CAPS capsule TAKE 1 CAPSULE (0.4 MG TOTAL) BY MOUTH 2 (TWO) TIMES DAILY. 01/12/21  Yes Chrystal, Sherrine Maples, MD  TYLENOL 500 MG tablet Take 1,000 mg by mouth daily.   Yes [provider]  aspirin EC 81 MG tablet Take 1 tablet (81 mg total) by mouth daily. Swallow whole. 12/20/22   Janetta Hora, PA-C  blood glucose meter kit and supplies KIT Dispense based on patient and insurance preference. Use up to four times daily as directed. (FOR ICD-9 250.00, 250.01). 04/08/21   [name],  NP  glucose blood (ACCU-CHEK GUIDE) test strip TEST FOUR TIMES A DAY 09/04/22   [name], NP      Allergies    Januvia [sitagliptin] and Metformin and related    Review of Systems   Review of Systems  Constitutional:  Positive for fatigue.  Gastrointestinal:  Positive for abdominal pain, diarrhea, nausea and vomiting (hematemesis). Negative for anal bleeding and blood in stool.  Neurological:  Negative for syncope.    Physical Exam Updated Vital Signs BP (!) 109/51 (BP Location: Right Arm)   Pulse 70   Temp 97.6 F  (36.4 C) (Oral)   Resp 16   Ht 5\' 11"  (1.803 m)   Wt 89.6 kg   SpO2 95%   BMI 27.55 kg/m  Physical Exam Vitals and nursing note reviewed.  Constitutional:      General: He is not in acute distress.    Appearance: Normal appearance. He is ill-appearing. He is not diaphoretic.  Cardiovascular:     Rate and Rhythm: Normal rate and regular rhythm.  Pulmonary:     Effort: Pulmonary effort is normal.  Abdominal:     General: Abdomen is flat.     Palpations: Abdomen is soft.     Tenderness: There is abdominal tenderness in the left lower quadrant.  Skin:    General: Skin is warm and dry.     Capillary Refill: Capillary refill takes less than 2 seconds.     Coloration: Skin is pale.  Neurological:     Mental Status: He is alert. Mental status is at baseline.  Psychiatric:        Mood and Affect: Mood normal.        Behavior: Behavior normal.     ED Results / Procedures / Treatments   Labs (all labs ordered are listed, but only abnormal results are displayed) Labs Reviewed  MRSA NEXT GEN BY PCR, NASAL - Abnormal; Notable for the following components:      Result Value   MRSA by PCR Next Gen DETECTED (*)    All other components within normal limits  CBC WITH DIFFERENTIAL/PLATELET - Abnormal; Notable for the following components:   RBC 4.05 (*)    Hemoglobin 12.7 (*)    Platelets 115 (*)    All other components within normal limits  COMPREHENSIVE METABOLIC PANEL - Abnormal; Notable for the following components:   CO2 20 (*)    Glucose, Bld 144 (*)    BUN 34 (*)    Calcium 8.6 (*)    Total Protein 5.3 (*)    Albumin 3.3 (*)    All other components within normal limits  URINALYSIS, ROUTINE W REFLEX MICROSCOPIC - Abnormal; Notable for the following components:   Specific Gravity, Urine 1.041 (*)    Glucose, UA >=500 (*)    Ketones, ur 20 (*)    All other components within normal limits  COMPREHENSIVE METABOLIC PANEL - Abnormal; Notable for the following components:   BUN  34 (*)    Calcium 8.0 (*)    Total Protein 4.9 (*)    Albumin 3.1 (*)    All other components within normal limits  CBC - Abnormal; Notable for the following components:   RBC 3.62 (*)    Hemoglobin 11.3 (*)    HCT 34.5 (*)    Platelets 104 (*)    All other components within normal limits  GLUCOSE, CAPILLARY - Abnormal; Notable for the following components:   Glucose-Capillary 139 (*)  All other components within normal limits  HEMOGLOBIN AND HEMATOCRIT, BLOOD - Abnormal; Notable for the following components:   Hemoglobin 10.4 (*)    HCT 32.0 (*)    All other components within normal limits  GLUCOSE, CAPILLARY - Abnormal; Notable for the following components:   Glucose-Capillary 161 (*)    All other components within normal limits  GLUCOSE, CAPILLARY - Abnormal; Notable for the following components:   Glucose-Capillary 170 (*)    All other components within normal limits  CBC WITH DIFFERENTIAL/PLATELET - Abnormal; Notable for the following components:   RBC 2.84 (*)    Hemoglobin 9.0 (*)    HCT 27.4 (*)    RDW 16.2 (*)    Platelets 112 (*)    All other components within normal limits  BASIC METABOLIC PANEL - Abnormal; Notable for the following components:   Potassium 5.2 (*)    CO2 19 (*)    Glucose, Bld 163 (*)    BUN 51 (*)    Creatinine, Ser 1.45 (*)    Calcium 8.1 (*)    GFR, Estimated 52 (*)    All other components within normal limits  HEMOGLOBIN AND HEMATOCRIT, BLOOD - Abnormal; Notable for the following components:   Hemoglobin 9.4 (*)    HCT 28.6 (*)    All other components within normal limits  GLUCOSE, CAPILLARY - Abnormal; Notable for the following components:   Glucose-Capillary 178 (*)    All other components within normal limits  GLUCOSE, CAPILLARY - Abnormal; Notable for the following components:   Glucose-Capillary 174 (*)    All other components within normal limits  GLUCOSE, CAPILLARY - Abnormal; Notable for the following components:    Glucose-Capillary 139 (*)    All other components within normal limits  GLUCOSE, CAPILLARY - Abnormal; Notable for the following components:   Glucose-Capillary 150 (*)    All other components within normal limits  HEMOGLOBIN AND HEMATOCRIT, BLOOD - Abnormal; Notable for the following components:   Hemoglobin 8.8 (*)    HCT 26.7 (*)    All other components within normal limits  GLUCOSE, CAPILLARY - Abnormal; Notable for the following components:   Glucose-Capillary 204 (*)    All other components within normal limits  GLUCOSE, CAPILLARY - Abnormal; Notable for the following components:   Glucose-Capillary 165 (*)    All other components within normal limits  LIPASE, BLOOD  GLUCOSE, CAPILLARY  MAGNESIUM  CBC  BASIC METABOLIC PANEL  POC OCCULT BLOOD, ED  TYPE AND SCREEN  PREPARE RBC (CROSSMATCH)    EKG None  Radiology CT ANGIO GI BLEED  Result Date: 12/20/2022 CLINICAL DATA:  lower abdominal pain, hematemesis EXAM: CTA ABDOMEN AND PELVIS WITHOUT AND WITH CONTRAST TECHNIQUE: Multidetector CT imaging of the abdomen and pelvis was performed using the standard protocol during bolus administration of intravenous contrast. Multiplanar reconstructed images and MIPs were obtained and reviewed to evaluate the vascular anatomy. RADIATION DOSE REDUCTION: This exam was performed according to the departmental dose-optimization program which includes automated exposure control, adjustment of the mA and/or kV according to patient size and/or use of iterative reconstruction technique. CONTRAST:  75mL OMNIPAQUE IOHEXOL 350 MG/ML SOLN COMPARISON:  None Available. FINDINGS: VASCULAR No extravasation of intravenous contrast within the lumen of the bowel. Aorta: Severe degenerative changes of the spine. 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 No dissection, vasculitis or significant stenosis. Celiac: Mild to moderate atherosclerotic plaque. Patent without evidence of aneurysm, dissection,  vasculitis or significant stenosis.  NWG:NFAO to moderate atherosclerotic plaque. Patent without evidence of aneurysm, dissection, vasculitis or significant stenosis. Renals: Mild to moderate atherosclerotic plaque.Both renal arteries are patent without evidence of aneurysm, dissection, vasculitis, fibromuscular dysplasia or significant stenosis. IMA: Patent without evidence of aneurysm, dissection, vasculitis or significant stenosis. Inflow: Severe atherosclerotic plaque. Patent without evidence of aneurysm, dissection, vasculitis or significant stenosis. Proximal Outflow: Moderate severe atherosclerotic plaque. Bilateral common femoral and visualized portions of the superficial and profunda femoral arteries are patent without evidence of aneurysm, dissection, vasculitis or significant stenosis. Veins: The main portal, splenic, superior mesenteric veins are patent. Review of the MIP images confirms the above findings. NON-VASCULAR Lower chest: No acute abnormality. Coronary artery calcifications. Cardiac leads. Coronary artery stent. Hepatobiliary: No focal liver abnormality. Calcified gallstone noted within the gallbladder lumen. No gallbladder wall thickening or pericholecystic fluid. No biliary dilatation. Pancreas: No focal lesion. Normal pancreatic contour. No surrounding inflammatory changes. No main pancreatic ductal dilatation. Spleen: Normal in size without focal abnormality. Adrenals/Urinary Tract: No adrenal nodule bilaterally. Bilateral kidneys enhance symmetrically. Subcentimeter hypodensity too small to characterize-no further follow-up indicated. No hydronephrosis. No hydroureter. The urinary bladder is unremarkable. Stomach/Bowel: Stomach is within normal limits. No evidence of bowel wall thickening or dilatation. Colonic diverticulosis. The appendix is not definitely identified with no inflammatory changes in the right lower quadrant to suggest acute appendicitis. Lymphatic: No lymphadenopathy.  Reproductive: Prostate is unremarkable. Other: No intraperitoneal free fluid. No intraperitoneal free gas. No organized fluid collection. Musculoskeletal: No intraperitoneal free fluid. No intraperitoneal free gas. No organized fluid collection. IMPRESSION: VASCULAR 1. No CT evidence of acute gastrointestinal hemorrhage. No abdominal wall hernia or abnormality. No suspicious lytic or blastic osseous lesions. No acute displaced fracture. Multilevel degenerative changes of the spine. . This recommendation follows ACR consensus guidelines: White Paper of the ACR Incidental Findings Committee II on Vascular Findings. J Am Coll Radiol 2013; 10:789-794. NON-VASCULAR 1. Cholelithiasis with no CT finding of acute cholecystitis. 2. Colonic diverticulosis with no acute diverticulitis. Electronically Signed   By: Tish Frederickson M.D.   On: 12/20/2022 23:44    Procedures Procedures    Medications Ordered in ED Medications  pantoprozole (PROTONIX) 80 mg /NS 100 mL infusion (8 mg/hr Intravenous New Bag/Given 12/22/22 1733)  pantoprazole (PROTONIX) injection 40 mg ( Intravenous MAR Unhold 12/21/22 1047)  influenza vaccine adjuvanted (FLUAD) injection 0.5 mL ( Intramuscular MAR Unhold 12/21/22 1047)  0.9 %  sodium chloride infusion (0 mLs Intravenous Stopped 12/21/22 2022)  isosorbide mononitrate (IMDUR) 24 hr tablet 60 mg (60 mg Oral Given 12/22/22 1034)  insulin aspart (novoLOG) injection 0-9 Units (2 Units Subcutaneous Given 12/22/22 1700)  ondansetron (ZOFRAN) tablet 4 mg ( Oral See Alternative 12/21/22 1107)    Or  ondansetron (ZOFRAN) injection 4 mg (4 mg Intravenous Given 12/21/22 1107)  albuterol (PROVENTIL) (2.5 MG/3ML) 0.083% nebulizer solution 2.5 mg ( Nebulization MAR Unhold 12/21/22 1047)  0.9 %  sodium chloride infusion (Manually program via Guardrails IV Fluids) ( Intravenous MAR Unhold 12/21/22 1047)  lactated ringers infusion (0 mLs Intravenous Stopped 12/22/22 0600)  morphine (PF) 2 MG/ML injection 2 mg (2 mg  Intravenous Given 12/21/22 1235)  0.9 %  sodium chloride infusion ( Intravenous Infusion Verify 12/22/22 1200)  sodium chloride 0.9 % bolus 1,000 mL (0 mLs Intravenous Stopped 12/20/22 2302)  ondansetron (ZOFRAN) injection 4 mg (4 mg Intravenous Given 12/20/22 2144)  pantoprazole (PROTONIX) injection 40 mg (40 mg Intravenous Given 12/20/22 2144)  iohexol (OMNIPAQUE) 350 MG/ML injection 75 mL (75 mLs  Intravenous Contrast Given 12/20/22 2314)    ED Course/ Medical Decision Making/ A&P Clinical Course as of 12/22/22 1821  Wed Dec 20, 2022  2248 Dr Russella Dar from [place] consulted. Recommends admission. They will see the pt in the morning.  [RP]    Clinical Course User Index [RP] Rondel Baton, MD                                 Medical Decision Making Amount and/or Complexity of Data Reviewed Labs: ordered. Radiology: ordered.  Risk Prescription drug management. Decision regarding hospitalization.   This patient presents to the ED with chief complaint(s) of coffee ground emesis, abdominal pain with pertinent past medical history of chronic Plavix use.  The complaint involves an extensive differential diagnosis and also carries with it a high risk of complications and morbidity.    The differential diagnosis includes PUD, gastritis, esophagitis   The initial plan is to obtain labs, imaging  Additional history obtained: Records reviewed  cardiology notes from today - patient will be switching from Plavix to baby aspirin daily starting tomorrow.  Initial Assessment:   Exam significant for ill-appearing, pale patient who is not in acute distress.  Patient is not actively vomiting.  Abdomen is soft with LLQ tenderness.  No abdominal distension or overlying skin changes.    Independent ECG/labs interpretation:  The following labs were independently interpreted:  CBC with mild anemia, no leukocytosis. Metabolic  panel without major electrolyte disturbance.  Renal and hepatic function  unremarkable.    Independent visualization and interpretation of imaging: I independently visualized the following imaging with scope of interpretation limited to determining acute life threatening conditions related to emergency care: CT GI bleed, which revealed no CT evidence of acute gastrointestinal hemorrhage.  Cholelithiasis without acute cholecystitis.  Chronic diverticulosis without acute diverticulitis.   Treatment and Reassessment: Patient given fluids, Zofran, and bolus of Protonix.  Will start patient on Protonix drip.    Consultations obtained:   GI was consulted and would like patient admitted for obs with plan to scope in the morning.  Patient to be NPO after midnight.    I requested consultation with on-call hospitalist provider and spoke with Dr. Anabel Halon who agrees with admission.   Disposition:   Patient to be admitted to [facility] for acute GI bleed with plan for EGD.         Final Clinical Impression(s) / ED Diagnoses Final diagnoses:  Acute upper GI bleed    Rx / DC Orders ED Discharge Orders     None         Lenard Simmer, PA-C 12/22/22 1821    Rondel Baton, MD 12/23/22 1046

## 2022-12-20 NOTE — H&P (Incomplete)
History and Physical    [name] Cook ZOX:[id number] DOB: 06/06/1950 DOA: 12/20/2022  PCP: [name], NP  Patient coming from: home  I have personally briefly reviewed patient's old medical records in [facility]  Chief Complaint: coffee ground emesis x 3 episodes  HPI: [name] Cook is a 72 y.o. male with medical history significant of  CHB s/p PPM, CAD s/p previous PCI to RCA in 2016, DM2, HLD, HTN, OSA, tobacco use, history of PE (02/2022)s.p 6 mo tx,atrial flutter/atrial fibrillation, previously on full dose OAC complicated by hx of GI bleeding. Patient due to hx of gi bleed on OAC is now  s/p LAAO with Watchman 06/22/22. Patient since then has been on taper of his DAPT. Patient's last dose of plavix was this am. Patient states around five days ago he began to experience abdominal pain. He states at that time he noted no associated n/v/d /black stools or bright red blood pre rectum. However as time progressed his abdominal pain got worse and  he  began to note lower appetite and generally feeling unwell. He states today prior to bed time he had increase abdominal pain and severe nausea. He states he then began to vomit and noted his emesis was dark in color similar to coffee grounds.  He notes with emesis he also became presyncopal. He however denies chest pain/ sob/loc associated with his symptoms.    ED Course:  Afeb, bp 126/80, hr 85,   sat 96 % on ra Labs 7.6, hgb 12.7 ( 13.8) Na 137 , K 4.5, CL 106, bicarb 20, glu 144, cr 1.18, TP 5.3 Lipase 35 CTA abd bleed study No CT evidence of acute gastrointestinal hemorrhage. No abdominal wall hernia or abnormality.  Tx  Protonix drip , zofran N S 1L  Review of Systems: As per HPI otherwise 10 point review of systems negative.   Past Medical History:  Diagnosis Date   Acute bronchitis with COPD (HCC) 03/07/2021   Acute pulmonary embolism (HCC) 03/08/2022   Arthritis    "knees and lower back" (03/14/2013)   Atrial flutter (HCC)     radiofrequency ablation in 2001   CAD (coronary artery disease)    a. Nonobstructive. Cardiac cath in 2001-50% mid RI, normal LM, LAD, RCA b. cath 10/16/2014 95% mid RCA treated with DES, 99% ost D1 medical management due to small aneurysmal segment   Chronic anticoagulation    chronic Coumadin anticoagulation   Chronic obstructive pulmonary disease (HCC) 04/20/2011   Diabetes mellitus, type 2 (HCC)    Diaphoresis 10/26/2021   Elevated lipase 06/23/2021   GERD (gastroesophageal reflux disease)    GIB (gastrointestinal bleeding) 03/08/2022   Hyperlipidemia    Hypertension    with hypertensive heart disease   Left knee pain 10/25/2017   medial   Obesity    Persistent atrial fibrillation (HCC)    recurrent atrial flutter since 2001 s/p DCCVs, multiple failed AADs, h/o tachy-mediated cardiomyopathy   Presence of Watchman left atrial appendage closure device 06/22/2022   Watchman  31mm FLX placed with Dr. Excell Seltzer   Shortness of breath    "can come on at any time" (03/14/2013)   Sleep apnea    "dx'd; couldn't wear the mask" (03/14/2013)   Symptomatic anemia 03/08/2022   Tobacco abuse    Urinary tract infection without hematuria 05/26/2022    Past Surgical History:  Procedure Laterality Date   ATRIAL FLUTTER ABLATION  2002   atrial flutter; subsequently developed atrial fibrillation  AV NODE ABLATION  01/24/2013   CARDIAC CATHETERIZATION  2002   CARDIAC CATHETERIZATION N/A 10/16/2014   Procedure: Left Heart Cath and Coronary Angiography;  Surgeon: Tonny Bollman, MD;  Location: [facility] INVASIVE CV LAB;  Service: Cardiovascular;  Laterality: N/A;   CARDIOVERSION  05/31/2011   Procedure: CARDIOVERSION;  Surgeon: [name]. [name], MD;  Location: [facility] ORS;  Service: Cardiovascular;  Laterality: N/A;   CARPAL TUNNEL RELEASE Left 1980's   COLONOSCOPY WITH PROPOFOL N/A 12/30/2018   Procedure: COLONOSCOPY WITH PROPOFOL;  Surgeon: Wyline Mood, MD;  Location: [facility] ENDOSCOPY;  Service:  Gastroenterology;  Laterality: N/A;   COLONOSCOPY WITH PROPOFOL N/A 12/04/2019   Procedure: COLONOSCOPY WITH PROPOFOL;  Surgeon: Wyline Mood, MD;  Location: [facility] ENDOSCOPY;  Service: Gastroenterology;  Laterality: N/A;   COLONOSCOPY WITH PROPOFOL N/A 06/13/2021   Procedure: COLONOSCOPY WITH PROPOFOL;  Surgeon: Wyline Mood, MD;  Location: [facility] ENDOSCOPY;  Service: Gastroenterology;  Laterality: N/A;   ESOPHAGOGASTRODUODENOSCOPY N/A 03/01/2022   Procedure: ESOPHAGOGASTRODUODENOSCOPY (EGD);  Surgeon: Vida Rigger, MD;  Location: [facility] ENDOSCOPY;  Service: Gastroenterology;  Laterality: N/A;   GIVENS CAPSULE STUDY Left 03/10/2022   Procedure: GIVENS CAPSULE STUDY;  Surgeon: Willis Modena, MD;  Location: [facility] ENDOSCOPY;  Service: Gastroenterology;  Laterality: Left;   INSERT / REPLACE / REMOVE PACEMAKER  01/24/2013    Medtronic Adapta L dual-chamber pacemaker, serial number [id number]    LEFT ATRIAL APPENDAGE OCCLUSION N/A 06/22/2022   Procedure: LEFT ATRIAL APPENDAGE OCCLUSION;  Surgeon: Tonny Bollman, MD;  Location: [facility] INVASIVE CV LAB;  Service: Cardiovascular;  Laterality: N/A;   LEFT HEART CATH AND CORONARY ANGIOGRAPHY N/A 06/07/2018   Procedure: LEFT HEART CATH AND CORONARY ANGIOGRAPHY;  Surgeon: Tonny Bollman, MD;  Location: [facility] INVASIVE CV LAB;  Service: Cardiovascular;  Laterality: N/A;   LEFT HEART CATHETERIZATION WITH CORONARY ANGIOGRAM N/A 03/17/2013   Procedure: LEFT HEART CATHETERIZATION WITH CORONARY ANGIOGRAM;  Surgeon: Kathleene Hazel, MD; LAD mild dz, D1 branch 100%, inferior branch 99%, CFX OK, RCA 50%, EF 65%     LOOP RECORDER IMPLANT  2002   PERMANENT PACEMAKER INSERTION N/A 01/24/2013   Procedure: PERMANENT PACEMAKER INSERTION;  Surgeon: Marinus Maw, MD;  Location: [facility] CATH LAB;  Service: Cardiovascular;  Laterality: N/A;   TEE WITHOUT CARDIOVERSION N/A 06/22/2022   Procedure: TRANSESOPHAGEAL ECHOCARDIOGRAM;  Surgeon: Tonny Bollman, MD;  Location: [facility] INVASIVE CV LAB;  Service:  Cardiovascular;  Laterality: N/A;   TIBIAL TUBERCLERPLASTY  ~ 2003     reports that he quit smoking about 8 years ago. His smoking use included cigarettes. He started smoking about 51 years ago. He has a 42 pack-year smoking history. He has never used smokeless tobacco. He reports that he does not currently use alcohol. He reports that he does not use drugs.  Allergies  Allergen Reactions   Januvia [Sitagliptin] Other (See Comments)    Elevated lipase   Metformin And Related Diarrhea    Family History  Problem Relation Age of Onset   Alzheimer's disease Mother    Osteoporosis Mother     Prior to Admission medications   Medication Sig Start Date End Date Taking? Authorizing Provider  albuterol (VENTOLIN HFA) 108 (90 Base) MCG/ACT inhaler INHALE 2 PUFFS BY MOUTH EVERY 4 HOURS AS NEEDED FOR WHEEZE OR FOR SHORTNESS OF BREATH 08/29/22  Yes [name], NP  Ascorbic Acid (VITAMIN C) 1000 MG tablet Take 2,000 mg by mouth daily.   Yes [provider]  budesonide-formoterol (SYMBICORT) 160-4.5 MCG/ACT inhaler INHALE 2 PUFFS  INTO THE LUNGS TWICE A DAY Patient taking differently: Inhale 2 puffs into the lungs daily as needed (for shortness of breath and wheezing). 01/12/21  Yes [name], NP  fluticasone (FLONASE) 50 MCG/ACT nasal spray PLACE 1 SPRAY INTO BOTH NOSTRILS DAILY AS NEEDED FOR ALLERGIES OR RHINITIS. 11/27/22  Yes [name], NP  gabapentin (NEURONTIN) 300 MG capsule TAKE 1 CAPSULE BY MOUTH EVERY MORNING AND 2 CAPSULES BY MOUTH AT NIGHT FOR NEUROPATHY 11/28/22  Yes [name], NP  glipiZIDE (GLUCOTROL XL) 10 MG 24 hr tablet TAKE 1 TABLET (10 MG TOTAL) BY MOUTH DAILY WITH BREAKFAST. FOR DIABETES. 11/29/22  Yes [name], NP  isosorbide mononitrate (IMDUR) 60 MG 24 hr tablet Take 1 tablet (60 mg total) by mouth daily. 12/20/22  Yes Cline Crock R, PA-C  JARDIANCE 25 MG TABS tablet TAKE 1 TABLET (25 MG TOTAL) BY MOUTH DAILY BEFORE BREAKFAST.  FOR DIABETES. 06/15/22  Yes [name], NP  lisinopril (ZESTRIL) 2.5 MG tablet TAKE 1 TABLET BY MOUTH EVERY DAY 07/03/22  Yes Marinus Maw, MD  nitroGLYCERIN (NITROSTAT) 0.4 MG SL tablet Place 1 tablet (0.4 mg total) under the tongue every 5 (five) minutes as needed for chest pain. Do not exceed 3 tablets in 24 hours. 05/09/22  Yes [name], NP  pantoprazole (PROTONIX) 40 MG tablet Take 1 tablet (40 mg total) by mouth daily. 03/12/22 12/20/22 Yes Vann, Jessica U, DO  rosuvastatin (CRESTOR) 20 MG tablet TAKE 1 TABLET BY MOUTH EVERY DAY IN THE EVENING FOR CHOLESTEROL 11/24/21  Yes [name], NP  sertraline (ZOLOFT) 100 MG tablet TAKE 1 TABLET (100 MG TOTAL) BY MOUTH DAILY. FOR ANXIETY AND DEPRESSION. 10/08/22  Yes [name], NP  tamsulosin (FLOMAX) 0.4 MG CAPS capsule TAKE 1 CAPSULE (0.4 MG TOTAL) BY MOUTH 2 (TWO) TIMES DAILY. 01/12/21  Yes Chrystal, Sherrine Maples, MD  TYLENOL 500 MG tablet Take 1,000 mg by mouth daily.   Yes [provider]  aspirin EC 81 MG tablet Take 1 tablet (81 mg total) by mouth daily. Swallow whole. 12/20/22   Janetta Hora, PA-C  blood glucose meter kit and supplies KIT Dispense based on patient and insurance preference. Use up to four times daily as directed. (FOR ICD-9 250.00, 250.01). 04/08/21   [name], NP  glucose blood (ACCU-CHEK GUIDE) test strip TEST FOUR TIMES A DAY 09/04/22   [name], NP    Physical Exam: Vitals:   12/20/22 2051 12/20/22 2055 12/20/22 2056  BP:  112/69   Pulse:  76   Resp:  18   Temp:  97.6 F (36.4 C)   TempSrc:  Oral   SpO2: 98% 99%   Weight:   93 kg  Height:   5\' 11"  (1.803 m)    Constitutional: NAD, calm, comfortable Vitals:   12/20/22 2051 12/20/22 2055 12/20/22 2056  BP:  112/69   Pulse:  76   Resp:  18   Temp:  97.6 F (36.4 C)   TempSrc:  Oral   SpO2: 98% 99%   Weight:   93 kg  Height:   5\' 11"  (1.803 m)   Eyes: PERRL, lids and conjunctivae normal ENMT: Mucous  membranes are moist. Posterior pharynx clear of any exudate or lesions.Normal dentition.  Neck: normal, supple, no masses, no thyromegaly Respiratory: clear to auscultation bilaterally, no wheezing, no crackles. Normal respiratory effort. No accessory muscle use.  Cardiovascular: Regular rate and rhythm, no murmurs /  rubs / gallops. No extremity edema. 2+ pedal pulses.  Abdomen: + tenderness epigastrium and b/l lower quadrant, no masses palpated. No hepatosplenomegaly. Bowel sounds positive.  Musculoskeletal: no clubbing / cyanosis. No joint deformity upper and lower extremities. Good ROM, no contractures. Normal muscle tone.  Skin: no rashes, lesions, ulcers. No induration Neurologic: CN 2-12 grossly intact. Sensation intact, strength 5/5 in all 4.  Psychiatric: Normal judgment and insight. Alert and oriented x 3. Normal mood.    Labs on Admission: I have personally reviewed following labs and imaging studies  CBC: Recent Labs  Lab 12/20/22 2116  WBC 7.6  NEUTROABS 6.1  HGB 12.7*  HCT 39.0  MCV 96.3  PLT 115*   Basic Metabolic Panel: Recent Labs  Lab 12/20/22 2116  NA 137  K 4.5  CL 106  CO2 20*  GLUCOSE 144*  BUN 34*  CREATININE 1.18  CALCIUM 8.6*   GFR: Estimated Creatinine Clearance: 67.9 mL/min (by C-G formula based on SCr of 1.18 mg/dL). Liver Function Tests: Recent Labs  Lab 12/20/22 2116  AST 18  ALT 19  ALKPHOS 40  BILITOT 0.4  PROT 5.3*  ALBUMIN 3.3*   Recent Labs  Lab 12/20/22 2116  LIPASE 35   No results for input(s): "AMMONIA" in the last 168 hours. Coagulation Profile: No results for input(s): "INR", "PROTIME" in the last 168 hours. Cardiac Enzymes: No results for input(s): "CKTOTAL", "CKMB", "CKMBINDEX", "TROPONINI" in the last 168 hours. BNP (last 3 results) No results for input(s): "PROBNP" in the last 8760 hours. HbA1C: No results for input(s): "HGBA1C" in the last 72 hours. CBG: No results for input(s): "GLUCAP" in the last 168  hours. Lipid Profile: No results for input(s): "CHOL", "HDL", "LDLCALC", "TRIG", "CHOLHDL", "LDLDIRECT" in the last 72 hours. Thyroid Function Tests: No results for input(s): "TSH", "T4TOTAL", "FREET4", "T3FREE", "THYROIDAB" in the last 72 hours. Anemia Panel: No results for input(s): "VITAMINB12", "FOLATE", "FERRITIN", "TIBC", "IRON", "RETICCTPCT" in the last 72 hours. Urine analysis:    Component Value Date/Time   COLORURINE STRAW (A) 03/08/2022 1218   APPEARANCEUR Clear 08/02/2022 1401   LABSPEC 1.024 03/08/2022 1218   LABSPEC 1.011 01/23/2013 0553   PHURINE 6.0 03/08/2022 1218   GLUCOSEU 3+ (A) 08/02/2022 1401   GLUCOSEU Negative 01/23/2013 0553   HGBUR NEGATIVE 03/08/2022 1218   BILIRUBINUR Negative 08/02/2022 1401   BILIRUBINUR Negative 01/23/2013 0553   KETONESUR NEGATIVE 03/08/2022 1218   PROTEINUR Negative 08/02/2022 1401   PROTEINUR NEGATIVE 03/08/2022 1218   UROBILINOGEN negative (A) 05/26/2022 1607   NITRITE Negative 08/02/2022 1401   NITRITE NEGATIVE 03/08/2022 1218   LEUKOCYTESUR Negative 08/02/2022 1401   LEUKOCYTESUR NEGATIVE 03/08/2022 1218   LEUKOCYTESUR Negative 01/23/2013 0553    Radiological Exams on Admission: CT ANGIO GI BLEED  Result Date: 12/20/2022 CLINICAL DATA:  lower abdominal pain, hematemesis EXAM: CTA ABDOMEN AND PELVIS WITHOUT AND WITH CONTRAST TECHNIQUE: Multidetector CT imaging of the abdomen and pelvis was performed using the standard protocol during bolus administration of intravenous contrast. Multiplanar reconstructed images and MIPs were obtained and reviewed to evaluate the vascular anatomy. RADIATION DOSE REDUCTION: This exam was performed according to the departmental dose-optimization program which includes automated exposure control, adjustment of the mA and/or kV according to patient size and/or use of iterative reconstruction technique. CONTRAST:  75mL OMNIPAQUE IOHEXOL 350 MG/ML SOLN COMPARISON:  None Available. FINDINGS: VASCULAR No  extravasation of intravenous contrast within the lumen of the bowel. Aorta: Severe degenerative changes of the spine. 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 No dissection, vasculitis or significant stenosis. Celiac: Mild to moderate atherosclerotic plaque. Patent without evidence of aneurysm, dissection, vasculitis or significant stenosis. WUJ:WJXB to moderate atherosclerotic plaque. Patent without evidence of aneurysm, dissection, vasculitis or significant stenosis. Renals: Mild to moderate atherosclerotic plaque.Both renal arteries are patent without evidence of aneurysm, dissection, vasculitis, fibromuscular dysplasia or significant stenosis. IMA: Patent without evidence of aneurysm, dissection, vasculitis or significant stenosis. Inflow: Severe atherosclerotic plaque. Patent without evidence of aneurysm, dissection, vasculitis or significant stenosis. Proximal Outflow: Moderate severe atherosclerotic plaque. Bilateral common femoral and visualized portions of the superficial and profunda femoral arteries are patent without evidence of aneurysm, dissection, vasculitis or significant stenosis. Veins: The main portal, splenic, superior mesenteric veins are patent. Review of the MIP images confirms the above findings. NON-VASCULAR Lower chest: No acute abnormality. Coronary artery calcifications. Cardiac leads. Coronary artery stent. Hepatobiliary: No focal liver abnormality. Calcified gallstone noted within the gallbladder lumen. No gallbladder wall thickening or pericholecystic fluid. No biliary dilatation. Pancreas: No focal lesion. Normal pancreatic contour. No surrounding inflammatory changes. No main pancreatic ductal dilatation. Spleen: Normal in size without focal abnormality. Adrenals/Urinary Tract: No adrenal nodule bilaterally. Bilateral kidneys enhance symmetrically. Subcentimeter hypodensity too small to characterize-no further follow-up indicated. No hydronephrosis. No  hydroureter. The urinary bladder is unremarkable. Stomach/Bowel: Stomach is within normal limits. No evidence of bowel wall thickening or dilatation. Colonic diverticulosis. The appendix is not definitely identified with no inflammatory changes in the right lower quadrant to suggest acute appendicitis. Lymphatic: No lymphadenopathy. Reproductive: Prostate is unremarkable. Other: No intraperitoneal free fluid. No intraperitoneal free gas. No organized fluid collection. Musculoskeletal: No intraperitoneal free fluid. No intraperitoneal free gas. No organized fluid collection. IMPRESSION: VASCULAR 1. No CT evidence of acute gastrointestinal hemorrhage. No abdominal wall hernia or abnormality. No suspicious lytic or blastic osseous lesions. No acute displaced fracture. Multilevel degenerative changes of the spine. . This recommendation follows ACR consensus guidelines: White Paper of the ACR Incidental Findings Committee II on Vascular Findings. J Am Coll Radiol 2013; 10:789-794. NON-VASCULAR 1. Cholelithiasis with no CT finding of acute cholecystitis. 2. Colonic diverticulosis with no acute diverticulitis. Electronically Signed   By: Tish Frederickson M.D.   On: 12/20/2022 23:44    EKG: Independently reviewed.   Assessment/Plan  Upper gi bleed  -hx of prior gi bleed on OAC - in setting of plavix use  - admit to progressive care  -continue protonix drip  - npo  - [facility] consulted and plans on EDG  in am  -hgb with one point drop now 12.7 , continue with serial h/h   Atrial fib/flutter  -s/p LAAO with watchman 06/22/22  -currently transitioned of DAPT with last plavix dose am of admission - plan to resume asa  once cleared by gi   CAD s/p MI /s/p PCI  -PCI to RCA in 2016, - continue on imdur   CHB s/p PPM  DM2 -iss/fs    HLD -resume station once tolerating po    HTN -hold for now resume as able once patient noted to have no further bleeding  OSA - at bedtime O2    tobacco use -in  remission   History of PE (02/2022) -s.p 6 mo tx  DVT prophylaxis: scd Code Status: full/ as discussed per patient wishes in event of cardiac arrest  Family Communication: none at bedside Disposition Plan: patient  expected to be admitted greater than 2 midnights  Consults called: [facility] Admission status:  progressive care   Lurline Del MD [facility]   If 7PM-7AM, please contact night-coverage [email or web address] Password [facility]  12/20/2022, 11:52 PM

## 2022-12-20 NOTE — Patient Instructions (Signed)
Medication Instructions:  Your physician has recommended you make the following change in your medication:  STOP CLOPIDOGREL(PLAVIX.) START ASPIRIN 81MG  TAKING 1 TABLET BY MOUTH DAILY. INCREASE ISOSORBIDE MONONITRATE(IMDUR) TO 60MG  BY MOUTH DAILY.  *If you need a refill on your cardiac medications before your next appointment, please call your pharmacy*   Lab Work: None  If you have labs (blood work) drawn today and your tests are completely normal, you will receive your results only by: MyChart Message (if you have MyChart) OR A paper copy in the mail If you have any lab test that is abnormal or we need to change your treatment, we will call you to review the results.   Testing/Procedures: How to Prepare for Your Cardiac PET/CT Stress Test:  1. Please do not take these medications before your test:   Medications that may interfere with the cardiac pharmacological stress agent (ex. nitrates - including erectile dysfunction medications, isosorbide mononitrate, tamulosin or beta-blockers) the day of the exam. (Erectile dysfunction medication should be held for at least 72 hrs prior to test) Your remaining medications may be taken with water.  2. Nothing to eat or drink, except water, 3 hours prior to arrival time.   NO caffeine/decaffeinated products, or chocolate 12 hours prior to arrival.  3. NO perfume, cologne or lotion on chest or abdomen area.          - FEMALES - Please avoid wearing dresses to this appointment.  4. Total time is 1 to 2 hours; you may want to bring reading material for the waiting time.  5. Please report to Radiology at the [facility] Main Entrance 30 minutes early for your test.  [address] [place], [place] [address]   Diabetic Preparation:  Hold oral medications. You may take NPH and Lantus insulin. Do not take Humalog or Humulin R (Regular Insulin) the day of your test. Check blood sugars prior to leaving the house. If able to eat  breakfast prior to 3 hour fasting, you may take all medications, including your insulin, Do not worry if you miss your breakfast dose of insulin - start at your next meal. Patients who wear a continuous glucose monitor MUST remove the device prior to scanning.   In preparation for your appointment, medication and supplies will be purchased.  Appointment availability is limited, so if you need to cancel or reschedule, please call the Radiology Department at [phone] [facility]) OR [phone] [facility])  24 hours in advance to avoid a cancellation fee of $100.00  What to Expect After you Arrive:  Once you arrive and check in for your appointment, you will be taken to a preparation room within the Radiology Department.  A technologist or Nurse will obtain your medical history, verify that you are correctly prepped for the exam, and explain the procedure.  Afterwards,  an IV will be started in your arm and electrodes will be placed on your skin for EKG monitoring during the stress portion of the exam. Then you will be escorted to the PET/CT scanner.  There, staff will get you positioned on the scanner and obtain a blood pressure and EKG.  During the exam, you will continue to be connected to the EKG and blood pressure machines.  A small, safe amount of a radioactive tracer will be injected in your IV to obtain a series of pictures of your heart along with an injection of a stress agent.    After your Exam:  It is recommended  that you eat a meal and drink a caffeinated beverage to counter act any effects of the stress agent.  Drink plenty of fluids for the remainder of the day and urinate frequently for the first couple of hours after the exam.  Your doctor will inform you of your test results within 7-10 business days.  For more information and frequently asked questions, please visit our website : [email or web address]  For questions about your test or how to prepare for your test,  please call: Cardiac Imaging Nurse Navigators Office: [phone]    Follow-Up: At [facility], you and your health needs are our priority.  As part of our continuing mission to provide you with exceptional heart care, we have created designated Provider Care Teams.  These Care Teams include your primary Cardiologist (physician) and Advanced Practice Providers (APPs -  Physician Assistants and Nurse Practitioners) who all work together to provide you with the care you need, when you need it.  We recommend signing up for the patient portal called "MyChart".  Sign up information is provided on this After Visit Summary.  MyChart is used to connect with patients for Virtual Visits (Telemedicine).  Patients are able to view lab/test results, encounter notes, upcoming appointments, etc.  Non-urgent messages can be sent to your provider as well.   To learn more about what you can do with MyChart, go to [email or web address].    Your next appointment:   First available  Provider:   [name], MD

## 2022-12-20 NOTE — Progress Notes (Unsigned)
[facility]   MULTIDISCIPLINARY HEART VALVE CLINIC                                     Cardiology Office Note:    Date:  12/21/2022   ID:  [name] Cook, DOB [birth year], MRN [id number]  PCP:  [name], NP  [facility] Cardiologist:  [name], MD  [facility] Electrophysiologist:  [name], MD   Referring MD: [name], NP   6 months s/p LAAO   History of Present Illness:    [name] Cook is a 72 y.o. male with a hx of CHB s/p PPM, CAD s/p previous PCI to RCA in 2016, DM2, HLD, HTN, OSA, tobacco use, history of PE (02/2022), GI bleeding on anticoagulation and atrial flutter/atrial fibrillation s/p LAAO with Watchman (06/22/22) who presents to clinic for follow up.   He had a previous cardiac catheterization 2014 in the setting of NSTEMI which revealed total occlusion of small caliber superior branch of bifurcating diagonal, which was not favorable for PCI given aneurysmal segment. He was admitted to hospital again in 09/2014 with unstable angina. LHC 10/16/2014 showed 95% mid RCA lesion, 99% ostial D1 stenosis, 100% lateral D1 occlusion. Severe RCA stenosis was treated successfully using Synergy DES. Echocardiogram obtained during that admission showed EF 35-40%. Post cath, he was placed on aspirin, Plavix and the Coumadin triple therapy. Nuclear stress test 09/19/21 was overall low risk with EF 63% and a small fixed defect of mild-moderate severity at the inferior apex.   He was initially diagnosed with atrial fibrillation in 2012 and failed several AAD and DCCVs. He had an AV node ablation in 2014. He was on Plavix and warfarin but was admitted 02/27/22 with an acute GI bleed that required transfusion. He has required a total of 5 PRBC infusions. Warfarin was held at that time. He was readmitted 03/08/22 with an acute PE and was restarted on Eliquis. He tolerated this well however due to his hx of bleeding on [facility], he was interested in pursuing long term stroke  prevention with Watchman.   He is now s/p LAAO closure with a 31 mm Watchman FLX device on 06/22/22. He was restarted on Eliquis 5mg  BID for 45 days then transitioned to Plavix monotherapy through 6 months duration. Follow up CT 08/10/22 showed normal placement/endothelialization and < 5 mm peri-device leak.   Seen in the ED on 11/06/22 with dizziness. Propranolol which had been previously used for tremors was discontinued.   Today the patient presents to clinic for follow up. Here alone. Not doing great. He has had several episodes of nitroglycerin responsive chest pain associated with shortness of breath and diaphoresis. He has had decreased exercise tolerance and some fatigue. Chest pain is not related to exertion and can come on at anytime. No dizziness or syncope.     Past Medical History:  Diagnosis Date   Acute bronchitis with COPD (HCC) 03/07/2021   Acute pulmonary embolism (HCC) 03/08/2022   Arthritis    "knees and lower back" (03/14/2013)   Atrial flutter (HCC)    radiofrequency ablation in 2001   CAD (coronary artery disease)    a. Nonobstructive. Cardiac cath in 2001-50% mid RI, normal LM, LAD, RCA b. cath 10/16/2014 95% mid RCA treated with DES, 99% ost D1 medical management due to small aneurysmal segment   Chronic anticoagulation    chronic Coumadin  anticoagulation   Chronic obstructive pulmonary disease (HCC) 04/20/2011   Diabetes mellitus, type 2 (HCC)    Diaphoresis 10/26/2021   Elevated lipase 06/23/2021   GERD (gastroesophageal reflux disease)    GIB (gastrointestinal bleeding) 03/08/2022   Hyperlipidemia    Hypertension    with hypertensive heart disease   Left knee pain 10/25/2017   medial   Obesity    Persistent atrial fibrillation (HCC)    recurrent atrial flutter since 2001 s/p DCCVs, multiple failed AADs, h/o tachy-mediated cardiomyopathy   Presence of Watchman left atrial appendage closure device 06/22/2022   Watchman  31mm FLX placed with Dr. Excell Seltzer    Shortness of breath    "can come on at any time" (03/14/2013)   Sleep apnea    "dx'd; couldn't wear the mask" (03/14/2013)   Symptomatic anemia 03/08/2022   Tobacco abuse    Urinary tract infection without hematuria 05/26/2022    Past Surgical History:  Procedure Laterality Date   ATRIAL FLUTTER ABLATION  2002   atrial flutter; subsequently developed atrial fibrillation   AV NODE ABLATION  01/24/2013   CARDIAC CATHETERIZATION  2002   CARDIAC CATHETERIZATION N/A 10/16/2014   Procedure: Left Heart Cath and Coronary Angiography;  Surgeon: Tonny Bollman, MD;  Location: [facility] INVASIVE CV LAB;  Service: Cardiovascular;  Laterality: N/A;   CARDIOVERSION  05/31/2011   Procedure: CARDIOVERSION;  Surgeon: [name]. [name], MD;  Location: [facility] ORS;  Service: Cardiovascular;  Laterality: N/A;   CARPAL TUNNEL RELEASE Left 1980's   COLONOSCOPY WITH PROPOFOL N/A 12/30/2018   Procedure: COLONOSCOPY WITH PROPOFOL;  Surgeon: Wyline Mood, MD;  Location: [facility] ENDOSCOPY;  Service: Gastroenterology;  Laterality: N/A;   COLONOSCOPY WITH PROPOFOL N/A 12/04/2019   Procedure: COLONOSCOPY WITH PROPOFOL;  Surgeon: Wyline Mood, MD;  Location: [facility] ENDOSCOPY;  Service: Gastroenterology;  Laterality: N/A;   COLONOSCOPY WITH PROPOFOL N/A 06/13/2021   Procedure: COLONOSCOPY WITH PROPOFOL;  Surgeon: Wyline Mood, MD;  Location: [facility] ENDOSCOPY;  Service: Gastroenterology;  Laterality: N/A;   ESOPHAGOGASTRODUODENOSCOPY N/A 03/01/2022   Procedure: ESOPHAGOGASTRODUODENOSCOPY (EGD);  Surgeon: Vida Rigger, MD;  Location: [facility] ENDOSCOPY;  Service: Gastroenterology;  Laterality: N/A;   GIVENS CAPSULE STUDY Left 03/10/2022   Procedure: GIVENS CAPSULE STUDY;  Surgeon: Willis Modena, MD;  Location: [facility] ENDOSCOPY;  Service: Gastroenterology;  Laterality: Left;   INSERT / REPLACE / REMOVE PACEMAKER  01/24/2013    Medtronic Adapta L dual-chamber pacemaker, serial number [id number]    LEFT ATRIAL APPENDAGE OCCLUSION N/A 06/22/2022    Procedure: LEFT ATRIAL APPENDAGE OCCLUSION;  Surgeon: Tonny Bollman, MD;  Location: [facility] INVASIVE CV LAB;  Service: Cardiovascular;  Laterality: N/A;   LEFT HEART CATH AND CORONARY ANGIOGRAPHY N/A 06/07/2018   Procedure: LEFT HEART CATH AND CORONARY ANGIOGRAPHY;  Surgeon: Tonny Bollman, MD;  Location: [facility] INVASIVE CV LAB;  Service: Cardiovascular;  Laterality: N/A;   LEFT HEART CATHETERIZATION WITH CORONARY ANGIOGRAM N/A 03/17/2013   Procedure: LEFT HEART CATHETERIZATION WITH CORONARY ANGIOGRAM;  Surgeon: Kathleene Hazel, MD; LAD mild dz, D1 branch 100%, inferior branch 99%, CFX OK, RCA 50%, EF 65%     LOOP RECORDER IMPLANT  2002   PERMANENT PACEMAKER INSERTION N/A 01/24/2013   Procedure: PERMANENT PACEMAKER INSERTION;  Surgeon: Marinus Maw, MD;  Location: [facility] CATH LAB;  Service: Cardiovascular;  Laterality: N/A;   TEE WITHOUT CARDIOVERSION N/A 06/22/2022   Procedure: TRANSESOPHAGEAL ECHOCARDIOGRAM;  Surgeon: Tonny Bollman, MD;  Location: [facility] INVASIVE CV LAB;  Service: Cardiovascular;  Laterality: N/A;   TIBIAL TUBERCLERPLASTY  ~  2003    Current Medications: Current Meds  Medication Sig   albuterol (VENTOLIN HFA) 108 (90 Base) MCG/ACT inhaler INHALE 2 PUFFS BY MOUTH EVERY 4 HOURS AS NEEDED FOR WHEEZE OR FOR SHORTNESS OF BREATH   Ascorbic Acid (VITAMIN C) 1000 MG tablet Take 2,000 mg by mouth daily.   aspirin EC 81 MG tablet Take 1 tablet (81 mg total) by mouth daily. Swallow whole.   blood glucose meter kit and supplies KIT Dispense based on patient and insurance preference. Use up to four times daily as directed. (FOR ICD-9 250.00, 250.01).   budesonide-formoterol (SYMBICORT) 160-4.5 MCG/ACT inhaler INHALE 2 PUFFS INTO THE LUNGS TWICE A DAY (Patient taking differently: Inhale 2 puffs into the lungs daily as needed (for shortness of breath and wheezing).)   fluticasone (FLONASE) 50 MCG/ACT nasal spray PLACE 1 SPRAY INTO BOTH NOSTRILS DAILY AS NEEDED FOR ALLERGIES OR RHINITIS.    gabapentin (NEURONTIN) 300 MG capsule TAKE 1 CAPSULE BY MOUTH EVERY MORNING AND 2 CAPSULES BY MOUTH AT NIGHT FOR NEUROPATHY   glipiZIDE (GLUCOTROL XL) 10 MG 24 hr tablet TAKE 1 TABLET (10 MG TOTAL) BY MOUTH DAILY WITH BREAKFAST. FOR DIABETES.   glucose blood (ACCU-CHEK GUIDE) test strip TEST FOUR TIMES A DAY   isosorbide mononitrate (IMDUR) 60 MG 24 hr tablet Take 1 tablet (60 mg total) by mouth daily.   JARDIANCE 25 MG TABS tablet TAKE 1 TABLET (25 MG TOTAL) BY MOUTH DAILY BEFORE BREAKFAST. FOR DIABETES.   lisinopril (ZESTRIL) 2.5 MG tablet TAKE 1 TABLET BY MOUTH EVERY DAY   nitroGLYCERIN (NITROSTAT) 0.4 MG SL tablet Place 1 tablet (0.4 mg total) under the tongue every 5 (five) minutes as needed for chest pain. Do not exceed 3 tablets in 24 hours.   rosuvastatin (CRESTOR) 20 MG tablet TAKE 1 TABLET BY MOUTH EVERY DAY IN THE EVENING FOR CHOLESTEROL   sertraline (ZOLOFT) 100 MG tablet TAKE 1 TABLET (100 MG TOTAL) BY MOUTH DAILY. FOR ANXIETY AND DEPRESSION.   tamsulosin (FLOMAX) 0.4 MG CAPS capsule TAKE 1 CAPSULE (0.4 MG TOTAL) BY MOUTH 2 (TWO) TIMES DAILY.   TYLENOL 500 MG tablet Take 1,000 mg by mouth daily.   [DISCONTINUED] clopidogrel (PLAVIX) 75 MG tablet Take 1 tablet (75 mg total) by mouth daily. START ON 422   [DISCONTINUED] diphenhydrAMINE (BENADRYL) 25 MG tablet Take 50 mg by mouth at bedtime as needed for itching.   [DISCONTINUED] isosorbide mononitrate (IMDUR) 30 MG 24 hr tablet Take 1 tablet (30 mg total) by mouth daily.      ROS:   Please see the history of present illness.    All other systems reviewed and are negative.  EKGs   EKG:  EKG is NOT ordered today.    Recent Labs: 03/08/2022: Magnesium 2.1 04/07/2022: B Natriuretic Peptide 80.2 04/28/2022: TSH 1.17 12/21/2022: ALT 18; BUN 34; Creatinine, Ser 1.15; Hemoglobin 11.3; Platelets 104; Potassium 4.8; Sodium 138  Recent Lipid Panel    Component Value Date/Time   CHOL 141 10/27/2022 1041   CHOL 147 05/28/2019 1202    CHOL 125 01/23/2013 0151   TRIG 115.0 10/27/2022 1041   TRIG 208 (H) 01/23/2013 0151   TRIG 289 05/05/2008 0000   HDL 55.10 10/27/2022 1041   HDL 54 05/28/2019 1202   HDL 33 (L) 01/23/2013 0151   CHOLHDL 3 10/27/2022 1041   VLDL 23.0 10/27/2022 1041   VLDL 42 (H) 01/23/2013 0151   LDLCALC 63 10/27/2022 1041   LDLCALC 69 05/28/2019 1202   LDLCALC  56 03/28/2017 1433   LDLCALC 50 01/23/2013 0151   LDLCALC 119 05/05/2008 0000     Risk Assessment/Calculations:    CHA2DS2-VASc Score =     This indicates a  % annual risk of stroke. The patient's score is based upon:        Physical Exam:    VS:  BP 126/80   Pulse 85   Ht 5\' 11"  (1.803 m)   Wt 204 lb (92.5 kg)   SpO2 96%   BMI 28.45 kg/m     Wt Readings from Last 3 Encounters:  12/21/22 199 lb 1.2 oz (90.3 kg)  12/20/22 204 lb (92.5 kg)  10/27/22 208 lb (94.3 kg)     GEN: Well nourished, well developed in no acute distress NECK: No JVD; No carotid bruits CARDIAC: RRR, no murmurs, rubs, gallops RESPIRATORY:  Clear to auscultation without rales, wheezing or rhonchi  ABDOMEN: Soft, non-tender, non-distended EXTREMITIES:  No edema; No deformity   ASSESSMENT:    1. Presence of Watchman left atrial appendage closure device   2. Atrial fibrillation, unspecified type (HCC)   3. Gastrointestinal hemorrhage, unspecified gastrointestinal hemorrhage type   4. Essential hypertension   5. Pulmonary embolism without acute cor pulmonale, unspecified chronicity, unspecified pulmonary embolism type (HCC)   6. Chest pain, unspecified type    PLAN:    In order of problems listed above:  Atrial fibrillation with history of GI bleeding s/p LAAO with Watchman: follow up CT showed normal placement/endothelialization and < 5 mm peri-device leak. Plan to stop plavix now and start a baby aspirin 81mg  daily to be continued indefinitely given CAD.   CAD: has known total occlusion of small caliber superior branch of bifurcating diagonal  (not favorable for PCI given aneurysmal segment) and DES to [facility]. He had a low risk nuclear stress test in 09/2021. Now having nitrate responsive chest pain and decreased exercise tolerance. Has some atypical features. Plan to increase imdur from 30mg  daily to 60mg  daily. Will set up a nuclear PET stress test.   HTN: BP at goal of 120/80. Continue on lisinopril 2.5mg  daily. Imdur increased.   History of PE (02/2022): treated with 6 months of OAC  Medication Adjustments/Labs and Tests Ordered: Current medicines are reviewed at length with the patient today.  Concerns regarding medicines are outlined above.  Orders Placed This Encounter  Procedures   NM PET CT CARDIAC PERFUSION MULTI W/ABSOLUTE BLOODFLOW   Meds ordered this encounter  Medications   isosorbide mononitrate (IMDUR) 60 MG 24 hr tablet    Sig: Take 1 tablet (60 mg total) by mouth daily.    Dispense:  90 tablet    Refill:  3   aspirin EC 81 MG tablet    Sig: Take 1 tablet (81 mg total) by mouth daily. Swallow whole.    Dispense:  90 tablet    Refill:  3    Patient Instructions  Medication Instructions:  Your physician has recommended you make the following change in your medication:  STOP CLOPIDOGREL(PLAVIX.) START ASPIRIN 81MG  TAKING 1 TABLET BY MOUTH DAILY. INCREASE ISOSORBIDE MONONITRATE(IMDUR) TO 60MG  BY MOUTH DAILY.  *If you need a refill on your cardiac medications before your next appointment, please call your pharmacy*   Lab Work: None  If you have labs (blood work) drawn today and your tests are completely normal, you will receive your results only by: MyChart Message (if you have MyChart) OR A paper copy in the mail If you have any lab test  that is abnormal or we need to change your treatment, we will call you to review the results.   Testing/Procedures: How to Prepare for Your Cardiac PET/CT Stress Test:  1. Please do not take these medications before your test:   Medications that may interfere with  the cardiac pharmacological stress agent (ex. nitrates - including erectile dysfunction medications, isosorbide mononitrate, tamulosin or beta-blockers) the day of the exam. (Erectile dysfunction medication should be held for at least 72 hrs prior to test) Your remaining medications may be taken with water.  2. Nothing to eat or drink, except water, 3 hours prior to arrival time.   NO caffeine/decaffeinated products, or chocolate 12 hours prior to arrival.  3. NO perfume, cologne or lotion on chest or abdomen area.          - FEMALES - Please avoid wearing dresses to this appointment.  4. Total time is 1 to 2 hours; you may want to bring reading material for the waiting time.  5. Please report to Radiology at the [facility] Main Entrance 30 minutes early for your test.  [address] [place], [place] [address]   Diabetic Preparation:  Hold oral medications. You may take NPH and Lantus insulin. Do not take Humalog or Humulin R (Regular Insulin) the day of your test. Check blood sugars prior to leaving the house. If able to eat breakfast prior to 3 hour fasting, you may take all medications, including your insulin, Do not worry if you miss your breakfast dose of insulin - start at your next meal. Patients who wear a continuous glucose monitor MUST remove the device prior to scanning.   In preparation for your appointment, medication and supplies will be purchased.  Appointment availability is limited, so if you need to cancel or reschedule, please call the Radiology Department at [phone] [facility]) OR [phone] [facility])  24 hours in advance to avoid a cancellation fee of $100.00  What to Expect After you Arrive:  Once you arrive and check in for your appointment, you will be taken to a preparation room within the Radiology Department.  A technologist or Nurse will obtain your medical history, verify that you are correctly prepped for the exam, and explain the  procedure.  Afterwards,  an IV will be started in your arm and electrodes will be placed on your skin for EKG monitoring during the stress portion of the exam. Then you will be escorted to the PET/CT scanner.  There, staff will get you positioned on the scanner and obtain a blood pressure and EKG.  During the exam, you will continue to be connected to the EKG and blood pressure machines.  A small, safe amount of a radioactive tracer will be injected in your IV to obtain a series of pictures of your heart along with an injection of a stress agent.    After your Exam:  It is recommended that you eat a meal and drink a caffeinated beverage to counter act any effects of the stress agent.  Drink plenty of fluids for the remainder of the day and urinate frequently for the first couple of hours after the exam.  Your doctor will inform you of your test results within 7-10 business days.  For more information and frequently asked questions, please visit our website : [email or web address]  For questions about your test or how to prepare for your test, please call: Cardiac Imaging Nurse Navigators Office: [phone]    Follow-Up: At [facility], you and your health needs are our priority.  As part of our continuing mission to provide you with exceptional heart care, we have created designated Provider Care Teams.  These Care Teams include your primary Cardiologist (physician) and Advanced Practice Providers (APPs -  Physician Assistants and Nurse Practitioners) who all work together to provide you with the care you need, when you need it.  We recommend signing up for the patient portal called "MyChart".  Sign up information is provided on this After Visit Summary.  MyChart is used to connect with patients for Virtual Visits (Telemedicine).  Patients are able to view lab/test results, encounter notes, upcoming appointments, etc.  Non-urgent messages can be sent to your provider as well.    To learn more about what you can do with MyChart, go to [email or web address].    Your next appointment:   First available  Provider:   [name], MD       Signed, Cline Crock, PA-C  12/21/2022 11:07 AM    [facility]

## 2022-12-21 ENCOUNTER — Encounter (HOSPITAL_COMMUNITY)
Admission: EM | Disposition: A | Discharge status: Home / Self Care | Payer: Self-pay | Source: Home / Self Care | Attending: Internal Medicine

## 2022-12-21 ENCOUNTER — Inpatient Hospital Stay (HOSPITAL_COMMUNITY): Payer: 59 | Admitting: Certified Registered Nurse Anesthetist

## 2022-12-21 DIAGNOSIS — K269 Duodenal ulcer, unspecified as acute or chronic, without hemorrhage or perforation: Secondary | ICD-10-CM

## 2022-12-21 DIAGNOSIS — Z7902 Long term (current) use of antithrombotics/antiplatelets: Secondary | ICD-10-CM | POA: Diagnosis not present

## 2022-12-21 DIAGNOSIS — K449 Diaphragmatic hernia without obstruction or gangrene: Secondary | ICD-10-CM

## 2022-12-21 DIAGNOSIS — K264 Chronic or unspecified duodenal ulcer with hemorrhage: Secondary | ICD-10-CM

## 2022-12-21 DIAGNOSIS — D62 Acute posthemorrhagic anemia: Secondary | ICD-10-CM

## 2022-12-21 DIAGNOSIS — K209 Esophagitis, unspecified without bleeding: Secondary | ICD-10-CM

## 2022-12-21 DIAGNOSIS — K922 Gastrointestinal hemorrhage, unspecified: Secondary | ICD-10-CM | POA: Diagnosis not present

## 2022-12-21 DIAGNOSIS — K920 Hematemesis: Secondary | ICD-10-CM

## 2022-12-21 HISTORY — PX: HEMOSTASIS CONTROL: SHX6838

## 2022-12-21 HISTORY — PX: ESOPHAGOGASTRODUODENOSCOPY (EGD) WITH PROPOFOL: SHX5813

## 2022-12-21 LAB — COMPREHENSIVE METABOLIC PANEL
ALT: 18 U/L (ref 0–44)
AST: 16 U/L (ref 15–41)
Albumin: 3.1 g/dL — ABNORMAL LOW (ref 3.5–5.0)
Alkaline Phosphatase: 39 U/L (ref 38–126)
Anion gap: 5 (ref 5–15)
BUN: 34 mg/dL — ABNORMAL HIGH (ref 8–23)
CO2: 23 mmol/L (ref 22–32)
Calcium: 8 mg/dL — ABNORMAL LOW (ref 8.9–10.3)
Chloride: 110 mmol/L (ref 98–111)
Creatinine, Ser: 1.15 mg/dL (ref 0.61–1.24)
GFR, Estimated: >60 mL/min (ref >60)
Glucose, Bld: 97 mg/dL (ref 70–99)
Potassium: 4.8 mmol/L (ref 3.5–5.1)
Sodium: 138 mmol/L (ref 135–145)
Total Bilirubin: 0.8 mg/dL (ref 0.3–1.2)
Total Protein: 4.9 g/dL — ABNORMAL LOW (ref 6.5–8.1)

## 2022-12-21 LAB — HEMOGLOBIN AND HEMATOCRIT, BLOOD
HCT: 32 % — ABNORMAL LOW (ref 39.0–52.0)
HCT: 28.6 % — ABNORMAL LOW (ref 39.0–52.0)
Hemoglobin: 10.4 g/dL — ABNORMAL LOW (ref 13.0–17.0)
Hemoglobin: 9.4 g/dL — ABNORMAL LOW (ref 13.0–17.0)

## 2022-12-21 LAB — URINALYSIS, ROUTINE W REFLEX MICROSCOPIC
Bacteria, UA: NONE SEEN
Bilirubin Urine: NEGATIVE
Glucose, UA: >=500 mg/dL — AB
Hgb urine dipstick: NEGATIVE
Ketones, ur: 20 mg/dL — AB
Leukocytes,Ua: NEGATIVE
Nitrite: NEGATIVE
Protein, ur: NEGATIVE mg/dL
Specific Gravity, Urine: 1.041 — ABNORMAL HIGH (ref 1.005–1.030)
pH: 5 (ref 5.0–8.0)

## 2022-12-21 LAB — GLUCOSE, CAPILLARY
Glucose-Capillary: 91 mg/dL (ref 70–99)
Glucose-Capillary: 139 mg/dL — ABNORMAL HIGH (ref 70–99)
Glucose-Capillary: 161 mg/dL — ABNORMAL HIGH (ref 70–99)
Glucose-Capillary: 170 mg/dL — ABNORMAL HIGH (ref 70–99)
Glucose-Capillary: 178 mg/dL — ABNORMAL HIGH (ref 70–99)
Glucose-Capillary: 174 mg/dL — ABNORMAL HIGH (ref 70–99)

## 2022-12-21 LAB — CBC
HCT: 34.5 % — ABNORMAL LOW (ref 39.0–52.0)
Hemoglobin: 11.3 g/dL — ABNORMAL LOW (ref 13.0–17.0)
MCH: 31.2 pg (ref 26.0–34.0)
MCHC: 32.8 g/dL (ref 30.0–36.0)
MCV: 95.3 fL (ref 80.0–100.0)
Platelets: 104 10*3/uL — ABNORMAL LOW (ref 150–400)
RBC: 3.62 MIL/uL — ABNORMAL LOW (ref 4.22–5.81)
RDW: 14.6 % (ref 11.5–15.5)
WBC: 4.8 10*3/uL (ref 4.0–10.5)
nRBC: 0 % (ref 0.0–0.2)

## 2022-12-21 LAB — PREPARE RBC (CROSSMATCH)

## 2022-12-21 LAB — MRSA NEXT GEN BY PCR, NASAL: MRSA by PCR Next Gen: DETECTED — AB

## 2022-12-21 SURGERY — ESOPHAGOGASTRODUODENOSCOPY (EGD) WITH PROPOFOL
Anesthesia: General

## 2022-12-21 MED ORDER — ONDANSETRON HCL 4 MG/2ML IJ SOLN
INTRAMUSCULAR | Status: DC | PRN
  Administered 2022-12-21: 4 mg via INTRAVENOUS

## 2022-12-21 MED ORDER — ROCURONIUM BROMIDE 10 MG/ML (PF) SYRINGE
PREFILLED_SYRINGE | INTRAVENOUS | Status: DC | PRN
  Administered 2022-12-21: 30 mg via INTRAVENOUS

## 2022-12-21 MED ORDER — SUGAMMADEX SODIUM 200 MG/2ML IV SOLN
INTRAVENOUS | Status: DC | PRN
  Administered 2022-12-21: 150 mg via INTRAVENOUS

## 2022-12-21 MED ORDER — LACTATED RINGERS IV SOLN: INTRAVENOUS | Status: DC

## 2022-12-21 MED ORDER — DEXAMETHASONE SODIUM PHOSPHATE 10 MG/ML IJ SOLN
INTRAMUSCULAR | Status: DC | PRN
  Administered 2022-12-21: 5 mg via INTRAVENOUS

## 2022-12-21 MED ORDER — SODIUM CHLORIDE 0.9% IV SOLUTION: Freq: Once | INTRAVENOUS | Status: DC

## 2022-12-21 MED ORDER — ALBUMIN HUMAN 5 % IV SOLN
INTRAVENOUS | Status: DC | PRN
Start: 2022-12-21 — End: 2022-12-21

## 2022-12-21 MED ORDER — PHENYLEPHRINE HCL-NACL 20-0.9 MG/250ML-% IV SOLN
INTRAVENOUS | Status: DC | PRN
  Administered 2022-12-21: 30 ug/min via INTRAVENOUS

## 2022-12-21 MED ORDER — SUCCINYLCHOLINE CHLORIDE 200 MG/10ML IV SOSY
PREFILLED_SYRINGE | INTRAVENOUS | Status: DC | PRN
  Administered 2022-12-21: 120 mg via INTRAVENOUS

## 2022-12-21 MED ORDER — INFLUENZA VAC A&B SURF ANT ADJ 0.5 ML IM SUSY
0.5000 mL | PREFILLED_SYRINGE | INTRAMUSCULAR | Status: AC
  Administered 2022-12-25: 0.5 mL via INTRAMUSCULAR
  Filled 2022-12-21: qty 0.5

## 2022-12-21 MED ORDER — INSULIN ASPART 100 UNIT/ML IJ SOLN
0.0000 [IU] | INTRAMUSCULAR | Status: DC
  Administered 2022-12-21: 2 [IU] via SUBCUTANEOUS
  Administered 2022-12-21: 1 [IU] via SUBCUTANEOUS
  Administered 2022-12-21 (×2): 2 [IU] via SUBCUTANEOUS
  Administered 2022-12-22: 1 [IU] via SUBCUTANEOUS
  Administered 2022-12-22: 3 [IU] via SUBCUTANEOUS
  Administered 2022-12-22 (×2): 2 [IU] via SUBCUTANEOUS
  Administered 2022-12-23: 7 [IU] via SUBCUTANEOUS
  Administered 2022-12-23: 5 [IU] via SUBCUTANEOUS
  Administered 2022-12-24: 3 [IU] via SUBCUTANEOUS
  Administered 2022-12-24: 1 [IU] via SUBCUTANEOUS
  Administered 2022-12-24 – 2022-12-25 (×5): 2 [IU] via SUBCUTANEOUS

## 2022-12-21 MED ORDER — SODIUM CHLORIDE 0.9 % IV SOLN: INTRAVENOUS | Status: AC

## 2022-12-21 MED ORDER — LIDOCAINE 2% (20 MG/ML) 5 ML SYRINGE
INTRAMUSCULAR | Status: DC | PRN
  Administered 2022-12-21: 60 mg via INTRAVENOUS

## 2022-12-21 MED ORDER — ALBUTEROL SULFATE (2.5 MG/3ML) 0.083% IN NEBU: 2.5000 mg | INHALATION_SOLUTION | RESPIRATORY_TRACT | Status: DC | PRN

## 2022-12-21 MED ORDER — ONDANSETRON HCL 4 MG PO TABS
4.0000 mg | ORAL_TABLET | Freq: Four times a day (QID) | ORAL | Status: DC | PRN
  Filled 2022-12-21: qty 1

## 2022-12-21 MED ORDER — ONDANSETRON HCL 4 MG/2ML IJ SOLN
4.0000 mg | Freq: Four times a day (QID) | INTRAMUSCULAR | Status: DC | PRN
  Administered 2022-12-21: 4 mg via INTRAVENOUS
  Filled 2022-12-21: qty 2

## 2022-12-21 MED ORDER — PROPOFOL 10 MG/ML IV BOLUS
INTRAVENOUS | Status: DC | PRN
  Administered 2022-12-21: 100 mg via INTRAVENOUS

## 2022-12-21 MED ORDER — ISOSORBIDE MONONITRATE ER 60 MG PO TB24
60.0000 mg | ORAL_TABLET | Freq: Every day | ORAL | Status: DC
  Administered 2022-12-21 – 2022-12-25 (×5): 60 mg via ORAL
  Filled 2022-12-21 (×5): qty 1

## 2022-12-21 MED ORDER — SODIUM CHLORIDE 0.9 % IV SOLN: INTRAVENOUS | Status: DC

## 2022-12-21 MED ORDER — SODIUM CHLORIDE (PF) 0.9 % IJ SOLN
PREFILLED_SYRINGE | INTRAMUSCULAR | Status: DC | PRN
  Administered 2022-12-21: 4 mL

## 2022-12-21 MED ORDER — MORPHINE SULFATE (PF) 2 MG/ML IV SOLN
2.0000 mg | INTRAVENOUS | Status: DC | PRN
  Administered 2022-12-21: 2 mg via INTRAVENOUS
  Filled 2022-12-21: qty 1

## 2022-12-21 SURGICAL SUPPLY — 15 items

## 2022-12-21 NOTE — Consult Note (Signed)
Referring Provider: Dr. Dolores Lory Primary Care Physician:  [name], NP Primary Gastroenterologist:  Dr. Wyline Mood  Reason for Consultation:  Hematemesis, lower abdominal pain  HPI: [name] Cook is a 72 y.o. male with a past medical history of obesity, arthritis, hypertension, hyperlipidemia, coronary artery disease s/p NSTEMI 2014 and s/p DES to the RCA 10/2014, CHB s/p pacemaker placement, atrial fibrillation s/p ablation 2014 and  s/p LAAO with Watchman device 06/2022, PE 02/2022, DM type II, GERD, diverticulosis, tubular adenomatous colon polyps and UGI bleed 02/2022.   He developed abdominal pain with decreased appetite 5 days ago progressively worsening.  He started vomiting dark emesis/coffee-ground emesis yesterday with near syncope therefore he presented to the ED for further evaluation.  Labs in the ED showed a WBC count 7.6.  Hemoglobin 12.7 down from 13.8 one month ago.  Platelet 115.  BUN 34 up from 18.  Creatinine 1.18.  Normal LFTs.  Lipase 35.  FOBT negative. CBC today: Hemoglobin 11.3.  Platelet 104.  CTA 9/4 was negative for active GI bleeding, showed cholelithiasis without evidence of acute cholecystitis and diverticulosis without evidence of acute diverticulitis.  A GI consult was requested for further evaluation regarding hematemesis and abdominal pain.  Last dose of Plavix was 6:30AM  12/20/2022.  He had one episode of bright red hematemesis last night with a moderate amount of dark red blood per the rectum. He passed a moderate amount of dark red blood with blood clots as seen on the bed pad around 8:15am this morning. He was sitting on the commode at this time, he endorsed feeling light headed. He passed a large amount of dark red blood with clots as seen in the commode. He was assisted back to bed. No CP or SOB. BP 88/53.   He was previously admitted to the hospital 02/27/2022 with an acute upper GI bleed/melena on Plavix and Warfarin.  Admission Hg 9.7. At that  time, he required a total of 4 units PRBCs. He underwent an EGD 03/01/2022 by Dr. Glade Nurse during this hospitalization which showed erosive gastropathy with stigmata of recent bleeding. His clinical status stabilized with a hemoglobin level of 8.1 and he was discharged home on 03/03/2022 on PPI twice daily, Plavix and Coumadin was discontinued.  He developed profound weakness with passing dark stools and he presented to the ED 03/08/2022 for further evaluation.  Admission hemoglobin level 8.4.  He was diagnosed with acute PE and LLE DVT. He was started on Heparin IV. A small bowel capsule endoscopy 03/14/2022 showed minimal inflammation in the duodenum without active bleeding.  His hemoglobin level stabilized at 9.1 without further evidence of active GI bleeding.  He was discharged home 03/12/2022 on Eliquis, Plavix and Pantoprazole daily.   ECHO TEE 06/22/2022:  A small 1-2 mm device leak was present but this improved after device deployment. All criteria were met for deployment. A trivial pericardial effusion was present before and after the case. A small iatrogenic ASD was present with L to R shunting. 1. Left ventricular ejection fraction, by estimation, is 60 to 65%. The left ventricle has normal function. 2. 3. Right ventricular systolic function is normal. The right ventricular size is normal. 4. No left atrial/left atrial appendage thrombus was detected. The mitral valve is grossly normal. Trivial mitral valve regurgitation. No evidence of mitral stenosis. 5. The aortic valve is tricuspid. Aortic valve regurgitation is not visualized. No aortic stenosis is present.   GI PROCEDURES:  Small bowel capsule endoscopy 03/14/2022: -  Minimal inflammation in the duodenum -No active bleeding  EGD 03/01/2022 at [facility] by Dr. Leary Roca: - Normal larynx.  - Small hiatal hernia.  - Erosive gastropathy with stigmata of recent bleeding.  - Normal duodenal bulb, first portion of the duodenum, second portion of the  duodenum and third portion of the duodenum.  - The examination was otherwise normal.  - No specimens collected.  Colonoscopy 06/13/2021: - One 10 mm polyp in the distal ascending colon, removed with a cold snare. Resected and retrieved.  - One 5 mm polyp in the sigmoid colon, removed with a cold snare. Resected and retrieved.  - Normal mucosa in the entire examined colon. Biopsied.  - Diverticulosis in the sigmoid colon.  - The examination was otherwise normal on direct and retroflexion views.  A. COLON, RANDOM; COLD BIOPSY:  - BENIGN COLONIC MUCOSA WITH NO SIGNIFICANT HISTOPATHOLOGIC CHANGE.  - NEGATIVE FOR FEATURES OF MICROSCOPIC COLITIS AND ACTIVE MUCOSAL  COLITIS.  - NEGATIVE FOR DYSPLASIA AND MALIGNANCY.   B. COLON POLYP, ASCENDING; COLD SNARE:  - TUBULAR ADENOMA.  - NEGATIVE FOR HIGH-GRADE DYSPLASIA AND MALIGNANCY.   C. COLON POLYP, SIGMOID; COLD SNARE:  - TUBULAR ADENOMA.  - NEGATIVE FOR HIGH-GRADE DYSPLASIA AND MALIGNANCY.   Colonoscopy 12/04/2019: - Preparation of the colon was poor.  - Stool in the rectum, in the sigmoid colon and in the descending colon.  - No specimens collected. -Repeat colonoscopy in 1 week with extra prep  Colonoscopy 12/30/2018: - Two 4 to 6 mm polyps in the sigmoid colon, removed with a cold snare. Resected and retrieved.  - Diverticulosis in the sigmoid colon.  - One 15 mm polyp in the transverse colon, removed piecemeal using a cold snare. Resected and retrieved. Clips were placed.  - The examination was otherwise normal on direct and retroflexion views A. COLON POLYP, TRANSVERSE; COLD SNARE:  - TUBULAR ADENOMA.  - NEGATIVE FOR HIGH-GRADE DYSPLASIA AND MALIGNANCY.   B. COLON POLYPS X2, SIGMOID; COLD SNARE:  - SINGLE FRAGMENT OF TUBULAR ADENOMA.  - NEGATIVE FOR HIGH-GRADE DYSPLASIA AND MALIGNANCY.      Past Medical History:  Diagnosis Date   Acute bronchitis with COPD (HCC) 03/07/2021   Acute pulmonary embolism (HCC) 03/08/2022    Arthritis    "knees and lower back" (03/14/2013)   Atrial flutter (HCC)    radiofrequency ablation in 2001   CAD (coronary artery disease)    a. Nonobstructive. Cardiac cath in 2001-50% mid RI, normal LM, LAD, RCA b. cath 10/16/2014 95% mid RCA treated with DES, 99% ost D1 medical management due to small aneurysmal segment   Chronic anticoagulation    chronic Coumadin anticoagulation   Chronic obstructive pulmonary disease (HCC) 04/20/2011   Diabetes mellitus, type 2 (HCC)    Diaphoresis 10/26/2021   Elevated lipase 06/23/2021   GERD (gastroesophageal reflux disease)    GIB (gastrointestinal bleeding) 03/08/2022   Hyperlipidemia    Hypertension    with hypertensive heart disease   Left knee pain 10/25/2017   medial   Obesity    Persistent atrial fibrillation (HCC)    recurrent atrial flutter since 2001 s/p DCCVs, multiple failed AADs, h/o tachy-mediated cardiomyopathy   Presence of Watchman left atrial appendage closure device 06/22/2022   Watchman  31mm FLX placed with Dr. Excell Seltzer   Shortness of breath    "can come on at any time" (03/14/2013)   Sleep apnea    "dx'd; couldn't wear the mask" (03/14/2013)   Symptomatic anemia 03/08/2022   Tobacco  abuse    Urinary tract infection without hematuria 05/26/2022    Past Surgical History:  Procedure Laterality Date   ATRIAL FLUTTER ABLATION  2002   atrial flutter; subsequently developed atrial fibrillation   AV NODE ABLATION  01/24/2013   CARDIAC CATHETERIZATION  2002   CARDIAC CATHETERIZATION N/A 10/16/2014   Procedure: Left Heart Cath and Coronary Angiography;  Surgeon: Tonny Bollman, MD;  Location: [facility] INVASIVE CV LAB;  Service: Cardiovascular;  Laterality: N/A;   CARDIOVERSION  05/31/2011   Procedure: CARDIOVERSION;  Surgeon: [name]. [name], MD;  Location: [facility] ORS;  Service: Cardiovascular;  Laterality: N/A;   CARPAL TUNNEL RELEASE Left 1980's   COLONOSCOPY WITH PROPOFOL N/A 12/30/2018   Procedure: COLONOSCOPY WITH PROPOFOL;   Surgeon: Wyline Mood, MD;  Location: [facility] ENDOSCOPY;  Service: Gastroenterology;  Laterality: N/A;   COLONOSCOPY WITH PROPOFOL N/A 12/04/2019   Procedure: COLONOSCOPY WITH PROPOFOL;  Surgeon: Wyline Mood, MD;  Location: [facility] ENDOSCOPY;  Service: Gastroenterology;  Laterality: N/A;   COLONOSCOPY WITH PROPOFOL N/A 06/13/2021   Procedure: COLONOSCOPY WITH PROPOFOL;  Surgeon: Wyline Mood, MD;  Location: [facility] ENDOSCOPY;  Service: Gastroenterology;  Laterality: N/A;   ESOPHAGOGASTRODUODENOSCOPY N/A 03/01/2022   Procedure: ESOPHAGOGASTRODUODENOSCOPY (EGD);  Surgeon: Vida Rigger, MD;  Location: [facility] ENDOSCOPY;  Service: Gastroenterology;  Laterality: N/A;   GIVENS CAPSULE STUDY Left 03/10/2022   Procedure: GIVENS CAPSULE STUDY;  Surgeon: Willis Modena, MD;  Location: [facility] ENDOSCOPY;  Service: Gastroenterology;  Laterality: Left;   INSERT / REPLACE / REMOVE PACEMAKER  01/24/2013    Medtronic Adapta L dual-chamber pacemaker, serial number [id number]    LEFT ATRIAL APPENDAGE OCCLUSION N/A 06/22/2022   Procedure: LEFT ATRIAL APPENDAGE OCCLUSION;  Surgeon: Tonny Bollman, MD;  Location: [facility] INVASIVE CV LAB;  Service: Cardiovascular;  Laterality: N/A;   LEFT HEART CATH AND CORONARY ANGIOGRAPHY N/A 06/07/2018   Procedure: LEFT HEART CATH AND CORONARY ANGIOGRAPHY;  Surgeon: Tonny Bollman, MD;  Location: [facility] INVASIVE CV LAB;  Service: Cardiovascular;  Laterality: N/A;   LEFT HEART CATHETERIZATION WITH CORONARY ANGIOGRAM N/A 03/17/2013   Procedure: LEFT HEART CATHETERIZATION WITH CORONARY ANGIOGRAM;  Surgeon: Kathleene Hazel, MD; LAD mild dz, D1 branch 100%, inferior branch 99%, CFX OK, RCA 50%, EF 65%     LOOP RECORDER IMPLANT  2002   PERMANENT PACEMAKER INSERTION N/A 01/24/2013   Procedure: PERMANENT PACEMAKER INSERTION;  Surgeon: Marinus Maw, MD;  Location: [facility] CATH LAB;  Service: Cardiovascular;  Laterality: N/A;   TEE WITHOUT CARDIOVERSION N/A 06/22/2022   Procedure: TRANSESOPHAGEAL ECHOCARDIOGRAM;   Surgeon: Tonny Bollman, MD;  Location: [facility] INVASIVE CV LAB;  Service: Cardiovascular;  Laterality: N/A;   TIBIAL TUBERCLERPLASTY  ~ 2003    Prior to Admission medications   Medication Sig Start Date End Date Taking? Authorizing Provider  albuterol (VENTOLIN HFA) 108 (90 Base) MCG/ACT inhaler INHALE 2 PUFFS BY MOUTH EVERY 4 HOURS AS NEEDED FOR WHEEZE OR FOR SHORTNESS OF BREATH 08/29/22  Yes [name], NP  Ascorbic Acid (VITAMIN C) 1000 MG tablet Take 2,000 mg by mouth daily.   Yes [provider]  budesonide-formoterol (SYMBICORT) 160-4.5 MCG/ACT inhaler INHALE 2 PUFFS INTO THE LUNGS TWICE A DAY Patient taking differently: Inhale 2 puffs into the lungs daily as needed (for shortness of breath and wheezing). 01/12/21  Yes [name], NP  fluticasone (FLONASE) 50 MCG/ACT nasal spray PLACE 1 SPRAY INTO BOTH NOSTRILS DAILY AS NEEDED FOR ALLERGIES OR RHINITIS. 11/27/22  Yes [name], NP  gabapentin (NEURONTIN) 300 MG  capsule TAKE 1 CAPSULE BY MOUTH EVERY MORNING AND 2 CAPSULES BY MOUTH AT NIGHT FOR NEUROPATHY 11/28/22  Yes [name], NP  glipiZIDE (GLUCOTROL XL) 10 MG 24 hr tablet TAKE 1 TABLET (10 MG TOTAL) BY MOUTH DAILY WITH BREAKFAST. FOR DIABETES. 11/29/22  Yes [name], NP  isosorbide mononitrate (IMDUR) 60 MG 24 hr tablet Take 1 tablet (60 mg total) by mouth daily. 12/20/22  Yes Cline Crock R, PA-C  JARDIANCE 25 MG TABS tablet TAKE 1 TABLET (25 MG TOTAL) BY MOUTH DAILY BEFORE BREAKFAST. FOR DIABETES. 06/15/22  Yes [name], NP  lisinopril (ZESTRIL) 2.5 MG tablet TAKE 1 TABLET BY MOUTH EVERY DAY 07/03/22  Yes Marinus Maw, MD  nitroGLYCERIN (NITROSTAT) 0.4 MG SL tablet Place 1 tablet (0.4 mg total) under the tongue every 5 (five) minutes as needed for chest pain. Do not exceed 3 tablets in 24 hours. 05/09/22  Yes [name], NP  pantoprazole (PROTONIX) 40 MG tablet Take 1 tablet (40 mg total) by mouth daily. 03/12/22 12/20/22 Yes  Vann, Jessica U, DO  rosuvastatin (CRESTOR) 20 MG tablet TAKE 1 TABLET BY MOUTH EVERY DAY IN THE EVENING FOR CHOLESTEROL 11/24/21  Yes [name], NP  sertraline (ZOLOFT) 100 MG tablet TAKE 1 TABLET (100 MG TOTAL) BY MOUTH DAILY. FOR ANXIETY AND DEPRESSION. 10/08/22  Yes [name], NP  tamsulosin (FLOMAX) 0.4 MG CAPS capsule TAKE 1 CAPSULE (0.4 MG TOTAL) BY MOUTH 2 (TWO) TIMES DAILY. 01/12/21  Yes Chrystal, Sherrine Maples, MD  TYLENOL 500 MG tablet Take 1,000 mg by mouth daily.   Yes [provider]  aspirin EC 81 MG tablet Take 1 tablet (81 mg total) by mouth daily. Swallow whole. 12/20/22   Janetta Hora, PA-C  blood glucose meter kit and supplies KIT Dispense based on patient and insurance preference. Use up to four times daily as directed. (FOR ICD-9 250.00, 250.01). 04/08/21   [name], NP  glucose blood (ACCU-CHEK GUIDE) test strip TEST FOUR TIMES A DAY 09/04/22   [name], NP    Current Facility-Administered Medications  Medication Dose Route Frequency Provider Last Rate Last Admin   0.9 %  sodium chloride infusion   Intravenous Continuous Lurline Del, MD 75 mL/hr at 12/21/22 0214 New Bag at 12/21/22 0214   albuterol (PROVENTIL) (2.5 MG/3ML) 0.083% nebulizer solution 2.5 mg  2.5 mg Nebulization Q2H PRN Lurline Del, MD       [START ON 12/22/2022] influenza vaccine adjuvanted (FLUAD) injection 0.5 mL  0.5 mL Intramuscular Tomorrow-1000 Skip Mayer A, MD       insulin aspart (novoLOG) injection 0-9 Units  0-9 Units Subcutaneous Q4H Skip Mayer A, MD       isosorbide mononitrate (IMDUR) 24 hr tablet 60 mg  60 mg Oral Daily Lurline Del, MD       ondansetron [facility]) tablet 4 mg  4 mg Oral Q6H PRN Lurline Del, MD       Or   ondansetron [facility]) injection 4 mg  4 mg Intravenous Q6H PRN Lurline Del, MD       [START ON 12/24/2022] pantoprazole (PROTONIX) injection 40 mg  40 mg Intravenous Q12H Clark, Meghan R, PA-C        pantoprozole (PROTONIX) 80 mg /NS 100 mL infusion  8 mg/hr Intravenous Continuous Clark, Meghan R, PA-C 10 mL/hr at 12/20/22 2317 8 mg/hr at 12/20/22 2317    Allergies as of 12/20/2022 - Review  Complete 12/20/2022  Allergen Reaction Noted   Januvia [sitagliptin] Other (See Comments) 06/23/2021   Metformin and related Diarrhea 06/23/2021    Family History  Problem Relation Age of Onset   Alzheimer's disease Mother    Osteoporosis Mother     Social History   Socioeconomic History   Marital status: Widowed    Spouse name: Not on file   Number of children: 1   Years of education: Not on file   Highest education level: Not on file  Occupational History   Occupation: Unemployed    Employer: UNEMPLOYED  Tobacco Use   Smoking status: Former    Current packs/day: 0.00    Average packs/day: 1 pack/day for 42.0 years (42.0 ttl pk-yrs)    Types: Cigarettes    Start date: 12/31/1971    Quit date: 12/30/2013    Years since quitting: 8.9   Smokeless tobacco: Never   Tobacco comments:    Former smoker 03/20/22  Vaping Use   Vaping status: Never Used  Substance and Sexual Activity   Alcohol use: Not Currently    Comment: 03/14/2013 "stopped drinking back in 2002; never had problem w/it"   Drug use: No   Sexual activity: Not Currently  Other Topics Concern   Not on file  Social History Narrative   Single.   Retired.    1 son, deceased.    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   Enjoys playing on the computer.       Social Determinants of Health   Financial Resource Strain: Low Risk  (10/10/2022)   Overall Financial Resource Strain (CARDIA)    Difficulty of Paying Living Expenses: Not hard at all  Food Insecurity: No Food Insecurity (12/21/2022)   Hunger Vital Sign    Worried About Running Out of Food in the Last Year: Never true    Ran Out of Food in the Last Year: Never true  Transportation Needs: No Transportation Needs  (12/21/2022)   PRAPARE - Administrator, Civil Service (Medical): No    Lack of Transportation (Non-Medical): No  Physical Activity: Insufficiently Active (10/10/2022)   Exercise Vital Sign    Days of Exercise per Week: 3 days    Minutes of Exercise per Session: 30 min  Stress: No Stress Concern Present (10/10/2022)   [employer] of Occupational Health - Occupational Stress Questionnaire    Feeling of Stress : Not at all  Social Connections: Socially Isolated (10/10/2022)   Social Connection and Isolation Panel [NHANES]    Frequency of Communication with Friends and Family: More than three times a week    Frequency of Social Gatherings with Friends and Family: Three times a week    Attends Religious Services: Never    Active Member of Clubs or Organizations: No    Attends Banker Meetings: Never    Marital Status: Widowed  Intimate Partner Violence: Not At Risk (12/21/2022)   Humiliation, Afraid, Rape, and Kick questionnaire    Fear of Current or Ex-Partner: No    Emotionally Abused: No    Physically Abused: No    Sexually Abused: No    Review of Systems: See  HPI, all other systems reviewed and are negative.   Physical Exam: Vital signs in last 24 hours: Temp:  [97.6 F (36.4 C)-97.7 F (36.5 C)] 97.6 F (36.4 C) (09/05 0715) Pulse Rate:  [70-89] 74 (09/05 0715) Resp:  [18-20] 20 (09/05 0715) BP: (102-128)/(57-89) 103/57 (09/05  0715) SpO2:  [96 %-99 %] 98 % (09/05 0715) Weight:  [90.3 kg-93 kg] 90.3 kg (09/05 0524) Last BM Date : 12/21/22 General:  Alert fatigued appearing male, hypotensive.  Head:  Normocephalic and atraumatic. Eyes:  No scleral icterus. Conjunctiva pink. Ears:  Normal auditory acuity. Nose:  No deformity, discharge or lesions. Mouth: Upper and lower dentures. No ulcers or lesions.  Neck:  Supple. No lymphadenopathy or thyromegaly.  Lungs: Breath sounds clear throughout. No wheezes, rhonchi or crackles.  Heart:  RRR, no  murmur.  Abdomen:  Soft, epigastric tenderness without rebound or guarding.  Rectal: Deferred. Musculoskeletal:  Symmetrical without gross deformities.  Pulses:  Normal pulses noted. Extremities:  Without clubbing or edema. Neurologic:  Alert and  oriented x 4. No focal deficits.  Skin:  Intact without significant lesions or rashes. Psych:  Alert and cooperative. Normal mood and affect.  Intake/Output from previous day: 09/04 0701 - 09/05 0700 In: 1093.1 [I.V.:93.1; IV Piggyback:1000] Out: 475 [Urine:475] Intake/Output this shift: No intake/output data recorded.  Lab Results: Recent Labs    12/20/22 2116 12/21/22 0218  WBC 7.6 4.8  HGB 12.7* 11.3*  HCT 39.0 34.5*  PLT 115* 104*   BMET Recent Labs    12/20/22 2116 12/21/22 0218  NA 137 138  K 4.5 4.8  CL 106 110  CO2 20* 23  GLUCOSE 144* 97  BUN 34* 34*  CREATININE 1.18 1.15  CALCIUM 8.6* 8.0*   LFT Recent Labs    12/21/22 0218  PROT 4.9*  ALBUMIN 3.1*  AST 16  ALT 18  ALKPHOS 39  BILITOT 0.8   PT/INR No results for input(s): "LABPROT", "INR" in the last 72 hours. Hepatitis Panel No results for input(s): "HEPBSAG", "HCVAB", "HEPAIGM", "HEPBIGM" in the last 72 hours.    Studies/Results: CT ANGIO GI BLEED  Result Date: 12/20/2022 CLINICAL DATA:  lower abdominal pain, hematemesis EXAM: CTA ABDOMEN AND PELVIS WITHOUT AND WITH CONTRAST TECHNIQUE: Multidetector CT imaging of the abdomen and pelvis was performed using the standard protocol during bolus administration of intravenous contrast. Multiplanar reconstructed images and MIPs were obtained and reviewed to evaluate the vascular anatomy. RADIATION DOSE REDUCTION: This exam was performed according to the departmental dose-optimization program which includes automated exposure control, adjustment of the mA and/or kV according to patient size and/or use of iterative reconstruction technique. CONTRAST:  75mL OMNIPAQUE IOHEXOL 350 MG/ML SOLN COMPARISON:  None  Available. FINDINGS: VASCULAR No extravasation of intravenous contrast within the lumen of the bowel. Aorta: Severe degenerative changes of the spine. 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 No dissection, vasculitis or significant stenosis. Celiac: Mild to moderate atherosclerotic plaque. Patent without evidence of aneurysm, dissection, vasculitis or significant stenosis. ZOX:WRUE to moderate atherosclerotic plaque. Patent without evidence of aneurysm, dissection, vasculitis or significant stenosis. Renals: Mild to moderate atherosclerotic plaque.Both renal arteries are patent without evidence of aneurysm, dissection, vasculitis, fibromuscular dysplasia or significant stenosis. IMA: Patent without evidence of aneurysm, dissection, vasculitis or significant stenosis. Inflow: Severe atherosclerotic plaque. Patent without evidence of aneurysm, dissection, vasculitis or significant stenosis. Proximal Outflow: Moderate severe atherosclerotic plaque. Bilateral common femoral and visualized portions of the superficial and profunda femoral arteries are patent without evidence of aneurysm, dissection, vasculitis or significant stenosis. Veins: The main portal, splenic, superior mesenteric veins are patent. Review of the MIP images confirms the above findings. NON-VASCULAR Lower chest: No acute abnormality. Coronary artery calcifications. Cardiac leads. Coronary artery stent. Hepatobiliary: No focal liver abnormality. Calcified gallstone noted within  the gallbladder lumen. No gallbladder wall thickening or pericholecystic fluid. No biliary dilatation. Pancreas: No focal lesion. Normal pancreatic contour. No surrounding inflammatory changes. No main pancreatic ductal dilatation. Spleen: Normal in size without focal abnormality. Adrenals/Urinary Tract: No adrenal nodule bilaterally. Bilateral kidneys enhance symmetrically. Subcentimeter hypodensity too small to characterize-no further follow-up indicated.  No hydronephrosis. No hydroureter. The urinary bladder is unremarkable. Stomach/Bowel: Stomach is within normal limits. No evidence of bowel wall thickening or dilatation. Colonic diverticulosis. The appendix is not definitely identified with no inflammatory changes in the right lower quadrant to suggest acute appendicitis. Lymphatic: No lymphadenopathy. Reproductive: Prostate is unremarkable. Other: No intraperitoneal free fluid. No intraperitoneal free gas. No organized fluid collection. Musculoskeletal: No intraperitoneal free fluid. No intraperitoneal free gas. No organized fluid collection. IMPRESSION: VASCULAR 1. No CT evidence of acute gastrointestinal hemorrhage. No abdominal wall hernia or abnormality. No suspicious lytic or blastic osseous lesions. No acute displaced fracture. Multilevel degenerative changes of the spine. . This recommendation follows ACR consensus guidelines: White Paper of the ACR Incidental Findings Committee II on Vascular Findings. J Am Coll Radiol 2013; 10:789-794. NON-VASCULAR 1. Cholelithiasis with no CT finding of acute cholecystitis. 2. Colonic diverticulosis with no acute diverticulitis. Electronically Signed   By: Tish Frederickson M.D.   On: 12/20/2022 23:44    IMPRESSION/PLAN:  72 year old male admitted to the hospital with acute UGI bleed, coffee ground emesis 9/4 with bright red hematemesis x 1 last night with dark red blood with clots per the rectum last night and x 2 this morning. Last dose of Plavix was 6:30Am on 9/5. Hg 12.7 -> 11.3. Patient hypotensive with BP 88/53. Stat H/H collected, results pending. Two units of PRBBcs ordered, not yet transfused -NPO -Emergent EGD with intubation benefits and risks discussed including risk with sedation, risk of bleeding, perforation and infection  -PPI infusion  -Further recommendation to be determined after EGD completed   Afib  -Continue to hold Plavix   CAD       Arnaldo Natal  12/21/2022,  9:04AM

## 2022-12-21 NOTE — ED Notes (Signed)
ED TO INPATIENT HANDOFF REPORT  ED Nurse Name and Phone #: Marcello Moores [phone]  S Name/Age/Gender Hall Busing 72 y.o. male Room/Bed: 019C/019C  Code Status   Code Status: Prior  Home/SNF/Other Home Patient oriented to: self, place, time, and situation Is this baseline? Yes   Triage Complete: Triage complete  Chief Complaint Upper GI bleed [K92.2]  Triage Note Patient BIB [facility] from home due to hematemesis. Patient states he started throwing up this evening and has been dark in color. Patient recently switched off of plavix to baby aspirin. Patient has no complaints of nausea at this time. 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 Received 450 mL of NS. Patient A&Ox4.   Allergies Allergies  Allergen Reactions   Januvia [Sitagliptin] Other (See Comments)    Elevated lipase   Metformin And Related Diarrhea    Level of Care/Admitting Diagnosis ED Disposition     ED Disposition  Admit   Condition  --   Comment  Hospital Area: [facility] [[id number]]  Level of Care: Progressive [102]  Admit to Progressive based on following criteria: GI, ENDOCRINE disease patients with GI bleeding, acute liver failure or pancreatitis, stable with diabetic ketoacidosis or thyrotoxicosis (hypothyroid) state.  May admit patient to [facility] or [facility] if equivalent level of care is available:: No  Covid Evaluation: Asymptomatic - no recent exposure (last 10 days) testing not required  Diagnosis: Upper GI bleed [267195]  Admitting Physician: Lurline Del [[id number]]  Attending Physician: Lurline Del [[id number]]  Certification:: I certify this patient will need inpatient services for at least 2 midnights  Expected Medical Readiness: 12/26/2022          B Medical/Surgery History Past Medical History:  Diagnosis Date   Acute bronchitis with COPD (HCC) 03/07/2021   Acute pulmonary embolism (HCC) 03/08/2022   Arthritis    "knees and lower back" (03/14/2013)   Atrial  flutter (HCC)    radiofrequency ablation in 2001   CAD (coronary artery disease)    a. Nonobstructive. Cardiac cath in 2001-50% mid RI, normal LM, LAD, RCA b. cath 10/16/2014 95% mid RCA treated with DES, 99% ost D1 medical management due to small aneurysmal segment   Chronic anticoagulation    chronic Coumadin anticoagulation   Chronic obstructive pulmonary disease (HCC) 04/20/2011   Diabetes mellitus, type 2 (HCC)    Diaphoresis 10/26/2021   Elevated lipase 06/23/2021   GERD (gastroesophageal reflux disease)    GIB (gastrointestinal bleeding) 03/08/2022   Hyperlipidemia    Hypertension    with hypertensive heart disease   Left knee pain 10/25/2017   medial   Obesity    Persistent atrial fibrillation (HCC)    recurrent atrial flutter since 2001 s/p DCCVs, multiple failed AADs, h/o tachy-mediated cardiomyopathy   Presence of Watchman left atrial appendage closure device 06/22/2022   Watchman  31mm FLX placed with Dr. Excell Seltzer   Shortness of breath    "can come on at any time" (03/14/2013)   Sleep apnea    "dx'd; couldn't wear the mask" (03/14/2013)   Symptomatic anemia 03/08/2022   Tobacco abuse    Urinary tract infection without hematuria 05/26/2022   Past Surgical History:  Procedure Laterality Date   ATRIAL FLUTTER ABLATION  2002   atrial flutter; subsequently developed atrial fibrillation   AV NODE ABLATION  01/24/2013   CARDIAC CATHETERIZATION  2002   CARDIAC CATHETERIZATION N/A 10/16/2014   Procedure: Left Heart Cath and Coronary Angiography;  Surgeon: Tonny Bollman, MD;  Location: [facility] INVASIVE CV LAB;  Service: Cardiovascular;  Laterality: N/A;   CARDIOVERSION  05/31/2011   Procedure: CARDIOVERSION;  Surgeon: [name]. [name], MD;  Location: [facility] ORS;  Service: Cardiovascular;  Laterality: N/A;   CARPAL TUNNEL RELEASE Left 1980's   COLONOSCOPY WITH PROPOFOL N/A 12/30/2018   Procedure: COLONOSCOPY WITH PROPOFOL;  Surgeon: Wyline Mood, MD;  Location: [facility] ENDOSCOPY;  Service:  Gastroenterology;  Laterality: N/A;   COLONOSCOPY WITH PROPOFOL N/A 12/04/2019   Procedure: COLONOSCOPY WITH PROPOFOL;  Surgeon: Wyline Mood, MD;  Location: [facility] ENDOSCOPY;  Service: Gastroenterology;  Laterality: N/A;   COLONOSCOPY WITH PROPOFOL N/A 06/13/2021   Procedure: COLONOSCOPY WITH PROPOFOL;  Surgeon: Wyline Mood, MD;  Location: [facility] ENDOSCOPY;  Service: Gastroenterology;  Laterality: N/A;   ESOPHAGOGASTRODUODENOSCOPY N/A 03/01/2022   Procedure: ESOPHAGOGASTRODUODENOSCOPY (EGD);  Surgeon: Vida Rigger, MD;  Location: [facility] ENDOSCOPY;  Service: Gastroenterology;  Laterality: N/A;   GIVENS CAPSULE STUDY Left 03/10/2022   Procedure: GIVENS CAPSULE STUDY;  Surgeon: Willis Modena, MD;  Location: [facility] ENDOSCOPY;  Service: Gastroenterology;  Laterality: Left;   INSERT / REPLACE / REMOVE PACEMAKER  01/24/2013    Medtronic Adapta L dual-chamber pacemaker, serial number [id number]    LEFT ATRIAL APPENDAGE OCCLUSION N/A 06/22/2022   Procedure: LEFT ATRIAL APPENDAGE OCCLUSION;  Surgeon: Tonny Bollman, MD;  Location: [facility] INVASIVE CV LAB;  Service: Cardiovascular;  Laterality: N/A;   LEFT HEART CATH AND CORONARY ANGIOGRAPHY N/A 06/07/2018   Procedure: LEFT HEART CATH AND CORONARY ANGIOGRAPHY;  Surgeon: Tonny Bollman, MD;  Location: [facility] INVASIVE CV LAB;  Service: Cardiovascular;  Laterality: N/A;   LEFT HEART CATHETERIZATION WITH CORONARY ANGIOGRAM N/A 03/17/2013   Procedure: LEFT HEART CATHETERIZATION WITH CORONARY ANGIOGRAM;  Surgeon: Kathleene Hazel, MD; LAD mild dz, D1 branch 100%, inferior branch 99%, CFX OK, RCA 50%, EF 65%     LOOP RECORDER IMPLANT  2002   PERMANENT PACEMAKER INSERTION N/A 01/24/2013   Procedure: PERMANENT PACEMAKER INSERTION;  Surgeon: Marinus Maw, MD;  Location: [facility] CATH LAB;  Service: Cardiovascular;  Laterality: N/A;   TEE WITHOUT CARDIOVERSION N/A 06/22/2022   Procedure: TRANSESOPHAGEAL ECHOCARDIOGRAM;  Surgeon: Tonny Bollman, MD;  Location: [facility] INVASIVE CV LAB;  Service:  Cardiovascular;  Laterality: N/A;   TIBIAL TUBERCLERPLASTY  ~ 2003     A IV Location/Drains/Wounds Patient Lines/Drains/Airways Status     Active Line/Drains/Airways     Name Placement date Placement time Site Days   Peripheral IV 11/06/22 20 G Left Antecubital 11/06/22  2004  Antecubital  45   Peripheral IV 12/20/22 20 G Left;Posterior Forearm 12/20/22  --  Forearm  1            Intake/Output Last 24 hours  Intake/Output Summary (Last 24 hours) at 12/21/2022 0020 Last data filed at 12/20/2022 2302 Gross per 24 hour  Intake 1000 ml  Output --  Net 1000 ml    Labs/Imaging Results for orders placed or performed during the hospital encounter of 12/20/22 (from the past 48 hour(s))  CBC with Differential     Status: Abnormal   Collection Time: 12/20/22  9:16 PM  Result Value Ref Range   WBC 7.6 4.0 - 10.5 K/uL   RBC 4.05 (L) 4.22 - 5.81 MIL/uL   Hemoglobin 12.7 (L) 13.0 - 17.0 g/dL   HCT 40.9 81.1 - 91.4 %   MCV 96.3 80.0 - 100.0 fL   MCH 31.4 26.0 - 34.0 pg   MCHC 32.6 30.0 - 36.0 g/dL   RDW 78.2 95.6 -  15.5 %   Platelets 115 (L) 150 - 400 K/uL   nRBC 0.0 0.0 - 0.2 %   Neutrophils Relative % 81 %   Neutro Abs 6.1 1.7 - 7.7 K/uL   Lymphocytes Relative 10 %   Lymphs Abs 0.8 0.7 - 4.0 K/uL   Monocytes Relative 8 %   Monocytes Absolute 0.6 0.1 - 1.0 K/uL   Eosinophils Relative 1 %   Eosinophils Absolute 0.1 0.0 - 0.5 K/uL   Basophils Relative 0 %   Basophils Absolute 0.0 0.0 - 0.1 K/uL   Immature Granulocytes 0 %   Abs Immature Granulocytes 0.03 0.00 - 0.07 K/uL    Comment: Performed at [facility], [address], [place], [place] [address]  Comprehensive metabolic panel     Status: Abnormal   Collection Time: 12/20/22  9:16 PM  Result Value Ref Range   Sodium 137 135 - 145 mmol/L   Potassium 4.5 3.5 - 5.1 mmol/L   Chloride 106 98 - 111 mmol/L   CO2 20 (L) 22 - 32 mmol/L   Glucose, Bld 144 (H) 70 - 99 mg/dL    Comment: Glucose reference range applies only  to samples taken after fasting for at least 8 hours.   BUN 34 (H) 8 - 23 mg/dL   Creatinine, Ser 6.21 0.61 - 1.24 mg/dL   Calcium 8.6 (L) 8.9 - 10.3 mg/dL   Total Protein 5.3 (L) 6.5 - 8.1 g/dL   Albumin 3.3 (L) 3.5 - 5.0 g/dL   AST 18 15 - 41 U/L   ALT 19 0 - 44 U/L   Alkaline Phosphatase 40 38 - 126 U/L   Total Bilirubin 0.4 0.3 - 1.2 mg/dL   GFR, Estimated >30 >86 mL/min    Comment: (NOTE) Calculated using the CKD-EPI Creatinine Equation (2021)    Anion gap 11 5 - 15    Comment: Performed at [facility], [address], [place], [place] [address]  Lipase, blood     Status: None   Collection Time: 12/20/22  9:16 PM  Result Value Ref Range   Lipase 35 11 - 51 U/L    Comment: Performed at [facility], [address] [place], [place] [address]  Type and screen [facility]     Status: None   Collection Time: 12/20/22  9:17 PM  Result Value Ref Range   ABO/RH(D) O POS    Antibody Screen NEG    Sample Expiration      12/23/2022,2359 Performed at [facility], [address] [place], [place] [address]   POC occult blood, ED Provider will collect     Status: None   Collection Time: 12/20/22  9:31 PM  Result Value Ref Range   Fecal Occult Bld NEGATIVE NEGATIVE   *Note: Due to a large number of results and/or encounters for the requested time period, some results have not been displayed. A complete set of results can be found in Results Review.   CT ANGIO GI BLEED  Result Date: 12/20/2022 CLINICAL DATA:  lower abdominal pain, hematemesis EXAM: CTA ABDOMEN AND PELVIS WITHOUT AND WITH CONTRAST TECHNIQUE: Multidetector CT imaging of the abdomen and pelvis was performed using the standard protocol during bolus administration of intravenous contrast. Multiplanar reconstructed images and MIPs were obtained and reviewed to evaluate the vascular anatomy. RADIATION DOSE REDUCTION: This exam was performed according to the departmental dose-optimization  program which includes automated exposure control, adjustment of the  mA and/or kV according to patient size and/or use of iterative reconstruction technique. CONTRAST:  75mL OMNIPAQUE IOHEXOL 350 MG/ML SOLN COMPARISON:  None Available. FINDINGS: VASCULAR No extravasation of intravenous contrast within the lumen of the bowel. Aorta: Severe degenerative changes of the spine. 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 No dissection, vasculitis or significant stenosis. Celiac: Mild to moderate atherosclerotic plaque. Patent without evidence of aneurysm, dissection, vasculitis or significant stenosis. GEX:BMWU to moderate atherosclerotic plaque. Patent without evidence of aneurysm, dissection, vasculitis or significant stenosis. Renals: Mild to moderate atherosclerotic plaque.Both renal arteries are patent without evidence of aneurysm, dissection, vasculitis, fibromuscular dysplasia or significant stenosis. IMA: Patent without evidence of aneurysm, dissection, vasculitis or significant stenosis. Inflow: Severe atherosclerotic plaque. Patent without evidence of aneurysm, dissection, vasculitis or significant stenosis. Proximal Outflow: Moderate severe atherosclerotic plaque. Bilateral common femoral and visualized portions of the superficial and profunda femoral arteries are patent without evidence of aneurysm, dissection, vasculitis or significant stenosis. Veins: The main portal, splenic, superior mesenteric veins are patent. Review of the MIP images confirms the above findings. NON-VASCULAR Lower chest: No acute abnormality. Coronary artery calcifications. Cardiac leads. Coronary artery stent. Hepatobiliary: No focal liver abnormality. Calcified gallstone noted within the gallbladder lumen. No gallbladder wall thickening or pericholecystic fluid. No biliary dilatation. Pancreas: No focal lesion. Normal pancreatic contour. No surrounding inflammatory changes. No main pancreatic ductal dilatation.  Spleen: Normal in size without focal abnormality. Adrenals/Urinary Tract: No adrenal nodule bilaterally. Bilateral kidneys enhance symmetrically. Subcentimeter hypodensity too small to characterize-no further follow-up indicated. No hydronephrosis. No hydroureter. The urinary bladder is unremarkable. Stomach/Bowel: Stomach is within normal limits. No evidence of bowel wall thickening or dilatation. Colonic diverticulosis. The appendix is not definitely identified with no inflammatory changes in the right lower quadrant to suggest acute appendicitis. Lymphatic: No lymphadenopathy. Reproductive: Prostate is unremarkable. Other: No intraperitoneal free fluid. No intraperitoneal free gas. No organized fluid collection. Musculoskeletal: No intraperitoneal free fluid. No intraperitoneal free gas. No organized fluid collection. IMPRESSION: VASCULAR 1. No CT evidence of acute gastrointestinal hemorrhage. No abdominal wall hernia or abnormality. No suspicious lytic or blastic osseous lesions. No acute displaced fracture. Multilevel degenerative changes of the spine. . This recommendation follows ACR consensus guidelines: White Paper of the ACR Incidental Findings Committee II on Vascular Findings. J Am Coll Radiol 2013; 10:789-794. NON-VASCULAR 1. Cholelithiasis with no CT finding of acute cholecystitis. 2. Colonic diverticulosis with no acute diverticulitis. Electronically Signed   By: Tish Frederickson M.D.   On: 12/20/2022 23:44    Pending Labs Unresulted Labs (From admission, onward)     Start     Ordered   12/20/22 2233  Urinalysis, Routine w reflex microscopic -Urine, Clean Catch  Once,   URGENT       Question:  Specimen Source  Answer:  Urine, Clean Catch   12/20/22 2232            Vitals/Pain Today's Vitals   12/20/22 2051 12/20/22 2055 12/20/22 2056  BP:  112/69   Pulse:  76   Resp:  18   Temp:  97.6 F (36.4 C)   TempSrc:  Oral   SpO2: 98% 99%   Weight:   93 kg  Height:   5\' 11"  (1.803 m)   PainSc:  9      Isolation Precautions No active isolations  Medications Medications  pantoprozole (PROTONIX) 80 mg /NS 100 mL infusion (8 mg/hr Intravenous New Bag/Given 12/20/22 2317)  pantoprazole (PROTONIX) injection 40 mg (has  no administration in time range)  0.9 %  sodium chloride infusion (has no administration in time range)  sodium chloride 0.9 % bolus 1,000 mL (0 mLs Intravenous Stopped 12/20/22 2302)  ondansetron (ZOFRAN) injection 4 mg (4 mg Intravenous Given 12/20/22 2144)  pantoprazole (PROTONIX) injection 40 mg (40 mg Intravenous Given 12/20/22 2144)  iohexol (OMNIPAQUE) 350 MG/ML injection 75 mL (75 mLs Intravenous Contrast Given 12/20/22 2314)    Mobility walks     Focused Assessments     R Recommendations: See Admitting Provider Note  Report given to:   Additional Notes:

## 2022-12-21 NOTE — Anesthesia Postprocedure Evaluation (Signed)
Anesthesia Post Note  Patient: [name] Cook  Procedure(s) Performed: ESOPHAGOGASTRODUODENOSCOPY (EGD) WITH PROPOFOL HEMOSTASIS CONTROL     Patient location during evaluation: PACU Anesthesia Type: General Level of consciousness: awake Pain management: pain level controlled Vital Signs Assessment: post-procedure vital signs reviewed and stable Respiratory status: spontaneous breathing, nonlabored ventilation and respiratory function stable Cardiovascular status: blood pressure returned to baseline and stable Postop Assessment: no apparent nausea or vomiting Anesthetic complications: no   No notable events documented.  Last Vitals:  Vitals:   12/21/22 1030 12/21/22 1056  BP: 131/63 (!) 160/72  Pulse: 71 70  Resp: 16 12  Temp:  36.7 C  SpO2: 100% 100%    Last Pain:  Vitals:   12/21/22 1305  TempSrc:   PainSc: Asleep                 Hannia Matchett P Keyona Emrich

## 2022-12-21 NOTE — Plan of Care (Signed)

## 2022-12-21 NOTE — Transfer of Care (Signed)
Immediate Anesthesia Transfer of Care Note  Patient: [name] Cook  Procedure(s) Performed: ESOPHAGOGASTRODUODENOSCOPY (EGD) WITH PROPOFOL HEMOSTASIS CONTROL  Patient Location: Endoscopy Unit  Anesthesia Type:General  Level of Consciousness: drowsy and patient cooperative  Airway & Oxygen Therapy: Patient Spontanous Breathing  Post-op Assessment: Report given to RN, Post -op Vital signs reviewed and unstable, Anesthesiologist notified, and Patient moving all extremities  Post vital signs: Reviewed and stable  Last Vitals:  Vitals Value Taken Time  BP 143/65 12/21/22 1018  Temp    Pulse    Resp 13 12/21/22 1018  SpO2 99 % 12/21/22 1018    Last Pain:  Vitals:   12/21/22 0906  TempSrc: Temporal  PainSc: 0-No pain         Complications: No notable events documented.

## 2022-12-21 NOTE — Progress Notes (Signed)
PROGRESS NOTE    [name] Cook  OZH:[id number] DOB: [name] 17, 1951 DOA: 12/20/2022 PCP: [name], NP   Brief Narrative:  72 y.o. male with medical history significant of  CHB s/p PPM, CAD s/p previous PCI to RCA in 2016, DM2, HLD, HTN, OSA, tobacco use, history of PE (02/2022) status post pulmonary 6 months of treatment; atrial flutter/atrial fibrillation previously on full dose oral anticoagulation complicated by history of GI bleeding with subsequent Watchman procedure on 06/22/2022 presented with coffee-ground emesis and abdominal pain.  On presentation, hemoglobin was 12.7.  CTA of abdomen showed no CT evidence of acute gastrointestinal hemorrhage.  He was started on Protonix drip.  GI was consulted.  Assessment & Plan:   Possible upper GI bleeding -Has a history of prior GI bleed on oral anticoagulation -Was recently on Plavix and cardiology had stopped Plavix on 12/20/2022 and recommended to start aspirin 81 mg daily. -Hemoglobin 12.7 on presentation.  Hemoglobin 11.3 earlier this morning.  Patient had hematemesis and melena with hematochezia as well this morning.  Will transfuse 2 units packed red cells as per GI recommendations. -Continue IV Protonix.  Follow GI recommendations.  Paroxysmal atrial fibrillation/flutter -Status post LAAO with Watchman on 06/22/2022.  Recent cardiology plan as above.  Hold off on aspirin till clearance from GI. -Currently rate controlled;  CAD status post MI status post PCI Hypertension -History of PCI to RCA in 2016 -Currently chest pain-free.  Hold Imdur because of GI bleeding.  Outpatient follow-up with cardiology  History of complete heart block status post pacemaker -Outpatient follow-up cardiology/EP  Thrombocytopenia -Questionable cause.  Monitor.  Diabetes mellitus type 2 -Continue CBGs with SSI.  Hyperlipidemia -Resume statin once able to tolerate oral  OSA -Continue CPAP at bedtime  History of PE in 02/2022 -Status post 6 months  of treatment  DVT prophylaxis: SCDs Code Status: Full Family Communication: None at bedside Disposition Plan: Status is: Inpatient Remains inpatient appropriate because: Of severity of illness    Consultants: GI  Procedures: None  Antimicrobials: None   Subjective: Patient seen and examined at bedside.  Had bloody vomiting and black/bloody stools earlier this morning.  No fever, chest pain or shortness of breath reported. Objective: Vitals:   12/21/22 1017 12/21/22 1018 12/21/22 1021 12/21/22 1030  BP: (!) 120/105 (!) 143/65 126/71 131/63  Pulse:   69 71  Resp: 13 13 14 16   Temp:      TempSrc:      SpO2:  99% 100% 100%  Weight:      Height:        Intake/Output Summary (Last 24 hours) at 12/21/2022 1047 Last data filed at 12/21/2022 1011 Gross per 24 hour  Intake 1758.07 ml  Output 675 ml  Net 1083.07 ml   Filed Weights   12/20/22 2056 12/21/22 0055 12/21/22 0524  Weight: 93 kg 92.4 kg 90.3 kg    Examination:  General exam: Appears calm and comfortable.  Chronically ill and deconditioned.  On room air. Respiratory system: Bilateral decreased breath sounds at bases with some scattered crackles Cardiovascular system: S1 & S2 heard, Rate controlled Gastrointestinal system: Abdomen is nondistended, soft and nontender. Normal bowel sounds heard. Extremities: No cyanosis, clubbing, edema  Central nervous system: Alert.  Slow to respond. No focal neurological deficits. Moving extremities Skin: No rashes, lesions or ulcers Psychiatry: Flat Affect.  Not agitated.   Data Reviewed: I have personally reviewed following labs and imaging studies  CBC: Recent Labs  Lab 12/20/22 2116 12/21/22 6295  WBC 7.6 4.8  NEUTROABS 6.1  --   HGB 12.7* 11.3*  HCT 39.0 34.5*  MCV 96.3 95.3  PLT 115* 104*   Basic Metabolic Panel: Recent Labs  Lab 12/20/22 2116 12/21/22 0218  NA 137 138  K 4.5 4.8  CL 106 110  CO2 20* 23  GLUCOSE 144* 97  BUN 34* 34*  CREATININE 1.18  1.15  CALCIUM 8.6* 8.0*   GFR: Estimated Creatinine Clearance: 63.7 mL/min (by C-G formula based on SCr of 1.15 mg/dL). Liver Function Tests: Recent Labs  Lab 12/20/22 2116 12/21/22 0218  AST 18 16  ALT 19 18  ALKPHOS 40 39  BILITOT 0.4 0.8  PROT 5.3* 4.9*  ALBUMIN 3.3* 3.1*   Recent Labs  Lab 12/20/22 2116  LIPASE 35   No results for input(s): "AMMONIA" in the last 168 hours. Coagulation Profile: No results for input(s): "INR", "PROTIME" in the last 168 hours. Cardiac Enzymes: No results for input(s): "CKTOTAL", "CKMB", "CKMBINDEX", "TROPONINI" in the last 168 hours. BNP (last 3 results) No results for input(s): "PROBNP" in the last 8760 hours. HbA1C: No results for input(s): "HGBA1C" in the last 72 hours. CBG: Recent Labs  Lab 12/21/22 0325 12/21/22 0713  GLUCAP 91 139*   Lipid Profile: No results for input(s): "CHOL", "HDL", "LDLCALC", "TRIG", "CHOLHDL", "LDLDIRECT" in the last 72 hours. Thyroid Function Tests: No results for input(s): "TSH", "T4TOTAL", "FREET4", "T3FREE", "THYROIDAB" in the last 72 hours. Anemia Panel: No results for input(s): "VITAMINB12", "FOLATE", "FERRITIN", "TIBC", "IRON", "RETICCTPCT" in the last 72 hours. Sepsis Labs: No results for input(s): "PROCALCITON", "LATICACIDVEN" in the last 168 hours.  Recent Results (from the past 240 hour(s))  MRSA Next Gen by PCR, Nasal     Status: Abnormal   Collection Time: 12/21/22 12:55 AM   Specimen: Nasal Mucosa; Nasal Swab  Result Value Ref Range Status   MRSA by PCR Next Gen DETECTED (A) NOT DETECTED Final    Comment: RESULTS CALLED TO, READ BACK BY AND VERIFIED WITH RN A.COOPER ON 12/21/22 AT 0301 BY NM (NOTE) The GeneXpert MRSA Assay (FDA approved for NASAL specimens only), is one component of a comprehensive MRSA colonization surveillance program. It is not intended to diagnose MRSA infection nor to guide or monitor treatment for MRSA infections. Test performance is not FDA approved in  patients less than 67 years old. Performed at [facility], [address], [place], [place] [address]          Radiology Studies: CT ANGIO GI BLEED  Result Date: 12/20/2022 CLINICAL DATA:  lower abdominal pain, hematemesis EXAM: CTA ABDOMEN AND PELVIS WITHOUT AND WITH CONTRAST TECHNIQUE: Multidetector CT imaging of the abdomen and pelvis was performed using the standard protocol during bolus administration of intravenous contrast. Multiplanar reconstructed images and MIPs were obtained and reviewed to evaluate the vascular anatomy. RADIATION DOSE REDUCTION: This exam was performed according to the departmental dose-optimization program which includes automated exposure control, adjustment of the mA and/or kV according to patient size and/or use of iterative reconstruction technique. CONTRAST:  75mL OMNIPAQUE IOHEXOL 350 MG/ML SOLN COMPARISON:  None Available. FINDINGS: VASCULAR No extravasation of intravenous contrast within the lumen of the bowel. Aorta: Severe degenerative changes of the spine. 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 No dissection, vasculitis or significant stenosis. Celiac: Mild to moderate atherosclerotic plaque. Patent without evidence of aneurysm, dissection, vasculitis or significant stenosis. UEA:VWUJ to moderate atherosclerotic plaque. Patent without evidence of aneurysm, dissection, vasculitis or significant stenosis.  Renals: Mild to moderate atherosclerotic plaque.Both renal arteries are patent without evidence of aneurysm, dissection, vasculitis, fibromuscular dysplasia or significant stenosis. IMA: Patent without evidence of aneurysm, dissection, vasculitis or significant stenosis. Inflow: Severe atherosclerotic plaque. Patent without evidence of aneurysm, dissection, vasculitis or significant stenosis. Proximal Outflow: Moderate severe atherosclerotic plaque. Bilateral common femoral and visualized portions of the superficial and profunda femoral  arteries are patent without evidence of aneurysm, dissection, vasculitis or significant stenosis. Veins: The main portal, splenic, superior mesenteric veins are patent. Review of the MIP images confirms the above findings. NON-VASCULAR Lower chest: No acute abnormality. Coronary artery calcifications. Cardiac leads. Coronary artery stent. Hepatobiliary: No focal liver abnormality. Calcified gallstone noted within the gallbladder lumen. No gallbladder wall thickening or pericholecystic fluid. No biliary dilatation. Pancreas: No focal lesion. Normal pancreatic contour. No surrounding inflammatory changes. No main pancreatic ductal dilatation. Spleen: Normal in size without focal abnormality. Adrenals/Urinary Tract: No adrenal nodule bilaterally. Bilateral kidneys enhance symmetrically. Subcentimeter hypodensity too small to characterize-no further follow-up indicated. No hydronephrosis. No hydroureter. The urinary bladder is unremarkable. Stomach/Bowel: Stomach is within normal limits. No evidence of bowel wall thickening or dilatation. Colonic diverticulosis. The appendix is not definitely identified with no inflammatory changes in the right lower quadrant to suggest acute appendicitis. Lymphatic: No lymphadenopathy. Reproductive: Prostate is unremarkable. Other: No intraperitoneal free fluid. No intraperitoneal free gas. No organized fluid collection. Musculoskeletal: No intraperitoneal free fluid. No intraperitoneal free gas. No organized fluid collection. IMPRESSION: VASCULAR 1. No CT evidence of acute gastrointestinal hemorrhage. No abdominal wall hernia or abnormality. No suspicious lytic or blastic osseous lesions. No acute displaced fracture. Multilevel degenerative changes of the spine. . This recommendation follows ACR consensus guidelines: White Paper of the ACR Incidental Findings Committee II on Vascular Findings. J Am Coll Radiol 2013; 10:789-794. NON-VASCULAR 1. Cholelithiasis with no CT finding of  acute cholecystitis. 2. Colonic diverticulosis with no acute diverticulitis. Electronically Signed   By: Tish Frederickson M.D.   On: 12/20/2022 23:44        Scheduled Meds:  [XBM Hold] sodium chloride   Intravenous Once   [MAR Hold] influenza vaccine adjuvanted  0.5 mL Intramuscular Tomorrow-1000   [MAR Hold] insulin aspart  0-9 Units Subcutaneous Q4H   [MAR Hold] isosorbide mononitrate  60 mg Oral Daily   [MAR Hold] pantoprazole  40 mg Intravenous Q12H   Continuous Infusions:  sodium chloride 75 mL/hr at 12/21/22 0214   sodium chloride     lactated ringers Stopped (12/21/22 1039)   pantoprazole 8 mg/hr (12/20/22 2317)          Glade Lloyd, MD [facility] 12/21/2022, 10:47 AM

## 2022-12-21 NOTE — Care Management (Signed)
  Transition of Care [facility]) Screening Note   Patient Details  Name: [name] Cook Date of Birth: 12-12-1950   [facility] Care [facility] CM/SW Contact:    Lockie Pares, RN Phone Number: 12/21/2022, 11:30 AM    Transition of Care Department [facility]) has reviewed patient and no TOC needs have been identified at this time. We will continue to monitor patient advancement through interdisciplinary progression rounds. If new patient transition needs arise, please place a TOC consult.

## 2022-12-21 NOTE — Anesthesia Preprocedure Evaluation (Addendum)
Anesthesia Evaluation  Patient identified by MRN, date of birth, ID band Patient awake    Reviewed: Allergy & Precautions, NPO status , Patient's Chart, lab work & pertinent test results  Airway Mallampati: III  TM Distance: >3 FB Neck ROM: Full    Dental  (+) Lower Dentures, Upper Dentures   Pulmonary sleep apnea , COPD,  COPD inhaler, former smoker, PE   Pulmonary exam normal        Cardiovascular hypertension, Pt. on medications + CAD and + Past MI  Normal cardiovascular exam+ dysrhythmias Atrial Fibrillation + pacemaker      Neuro/Psych  PSYCHIATRIC DISORDERS Anxiety Depression    negative neurological ROS     GI/Hepatic Neg liver ROS,GERD  Medicated,,  Endo/Other  diabetes, Oral Hypoglycemic Agents    Renal/GU Renal disease     Musculoskeletal  (+) Arthritis ,    Abdominal   Peds  Hematology  (+) Blood dyscrasia, anemia   Anesthesia Other Findings GI  bleed  Reproductive/Obstetrics                              Anesthesia Physical Anesthesia Plan  ASA: 3 and emergent  Anesthesia Plan: General   Post-op Pain Management:    Induction: Intravenous and Rapid sequence  PONV Risk Score and Plan: 2 and Ondansetron, Dexamethasone and Treatment may vary due to age or medical condition  Airway Management Planned: Oral ETT  Additional Equipment:   Intra-op Plan:   Post-operative Plan: Extubation in OR  Informed Consent: I have reviewed the patients History and Physical, chart, labs and discussed the procedure including the risks, benefits and alternatives for the proposed anesthesia with the patient or authorized representative who has indicated his/her understanding and acceptance.     Dental advisory given  Plan Discussed with: CRNA  Anesthesia Plan Comments:         Anesthesia Quick Evaluation

## 2022-12-21 NOTE — Anesthesia Procedure Notes (Signed)
Procedure Name: Intubation Date/Time: 12/21/2022 9:41 AM  Performed by: Alease Medina, CRNAPre-anesthesia Checklist: Patient identified, Emergency Drugs available, Suction available and Patient being monitored Patient Re-evaluated:Patient Re-evaluated prior to induction Oxygen Delivery Method: Circle system utilized Preoxygenation: Pre-oxygenation with 100% oxygen Induction Type: IV induction, Rapid sequence and Cricoid Pressure applied Laryngoscope Size: Mac and 4 Grade View: Grade I Tube type: Oral Tube size: 7.5 mm Number of attempts: 1 Airway Equipment and Method: Stylet and Oral airway Placement Confirmation: ETT inserted through vocal cords under direct vision, positive ETCO2 and breath sounds checked- equal and bilateral Secured at: 22 cm Tube secured with: Tape Dental Injury: Teeth and Oropharynx as per pre-operative assessment

## 2022-12-21 NOTE — Progress Notes (Signed)
Patient called RN to room after vomiting approximately 200 mL of bloody vomit. Patient also stated he needed to use the bathroom to have a bowel movement and had a large dark stool with fresh red blood.

## 2022-12-21 NOTE — Op Note (Signed)
[facility] Patient Name: [name] Cook Procedure Date : 12/21/2022 MRN: [id number] Attending MD: Willaim Rayas. Adela Lank , MD, [id number] Date of Birth: August 03, 1950 CSN: [id number] Age: 72 Admit Type: Inpatient Procedure:                Upper GI endoscopy Indications:              Hematemesis and hematochezia - severe upper GI                            bleed - patient electively intubated to protect                            airway for this exam. Therapeutic upper endoscope                            used for this exam Providers:                Willaim Rayas. Adela Lank, MD, Marge Duncans, RN,                            Priscella Mann, Technician Referring MD:              Medicines:                Monitored Anesthesia Care Complications:            No immediate complications. Estimated blood loss:                            Minimal. Estimated Blood Loss:     Estimated blood loss was minimal. Procedure:                Pre-Anesthesia Assessment:                           - Prior to the procedure, a History and Physical                            was performed, and patient medications and                            allergies were reviewed. The patient's tolerance of                            previous anesthesia was also reviewed. The risks                            and benefits of the procedure and the sedation                            options and risks were discussed with the patient.                            All questions were answered, and informed consent  was obtained. Prior Anticoagulants: The patient has                            taken Plavix (clopidogrel), last dose was 1 day                            prior to procedure. ASA Grade Assessment: III - A                            patient with severe systemic disease. After                            reviewing the risks and benefits, the patient was                            deemed in  satisfactory condition to undergo the                            procedure.                           After obtaining informed consent, the endoscope was                            passed under direct vision. Throughout the                            procedure, the patient's blood pressure, pulse, and                            oxygen saturations were monitored continuously. The                            [id number] ([id number]) Therapeutic endoscope was                            introduced through the mouth, and advanced to the                            third part of duodenum. The upper GI endoscopy was                            accomplished without difficulty. The patient                            tolerated the procedure well. Scope In: Scope Out: Findings:      A small hiatal hernia was present.      LA Grade A esophagitis with no bleeding was found at the GEJ      The exam of the esophagus was otherwise normal.      Red blood was found in the gastric fundus, in the gastric antrum and at       the pylorus. Significant clot burden in the fundus. This was mostly       cleared with the therapeutic  endoscope (small clot burden remained in       fundus). No active bleeding in the stomach following lavage.      The exam of the stomach was otherwise normal.      Red blood was found in the duodenal bulb and in the second portion of       the duodenum. Significant burden of fresh red blood and clots which was       lavaged / suctioned with therapeutic upper endoscope.      One superficial duodenal ulcer with an adherent clot and spurting       hemorrhage (pulsatile / arterial - Forrest Class Ia) was found in the       second portion of the duodenum. The lesion was roughly 15 mm in largest       dimension. The area was initially injected with 4 mL of a 0.1 mg/mL       solution of epinephrine for drug delivery which slowed bleeding and       helped with visualization. The clot was removed with  water jet revealing       an actively bleeding vessel. Fulguration using Goldprobe to ablate the       vessel and stop bleeding was then applied with immediate hemostasis. The       area was observed for several minutes and no further bleeding following       treatment with the Goldprobe. Hemostasis clip not placed, would have       been difficult to clip in this area with risk for further mucosal       trauma. To help prevent rebleeding in this high risk lesion, hemostatic       gel (Purastat) was deployed to cover the lesion. There was no bleeding       at the end of the procedure.      There was an addition few superficial ulcerations in the second portion       of the duodenum, none with stigmata for bleeding. The exam of the       duodenum was otherwise normal. Biopsies not taken given recent severe       bleeding. Impression:               - Small hiatal hernia.                           - LA Grade A reflux esophagitis                           - Normal esophagus otherwise.                           - Red blood and clot in the gastric fundus, in the                            gastric antrum and in the pylorus. Lavaged without                            any active bleeding noted (small area of cardia /                            fundus not seen due to residual clot  but not                            entirely cleared given active bleeding treated in                            the duodenum)                           - Blood in the duodenal bulb and in the second                            portion of the duodenum.                           - Actively bleeding duodenal ulcer (Forrest Class                            Ia) in the second portion of the duodenum treated                            with epinephrine injection, coagulation using                            Goldprobe, and then Purastat. Hemostasis achieved.                           - A few other smaller clean based superficial                             ulcers in the second portion of the duodenum.                           - Biopsies not taken given recent severe bleeding                            and recent Plavix use. Recommendation:           - Return patient to hospital ward for ongoing care.                           - Okay to extubate                           - NPO today given recent endoscopic treatment and                            risk for rebleeding.                           - Continue present medications.                           - Continue IV protonix drip for now                           -  Monitor Hgb, response to transfusion                           - Avoid all NSAIDs                           - Continue to hold Plavix (patient states just                            yesterday his cardiologist stopped it and no plans                            to resume it)                           - GI service will follow, call with questions or                            recurrent bleeding in the interim. Procedure Code(s):        --- Professional ---                           [phone], Esophagogastroduodenoscopy, flexible,                            transoral; with control of bleeding, any method                           43236, 59, Esophagogastroduodenoscopy, flexible,                            transoral; with directed submucosal injection(s),                            any substance Diagnosis Code(s):        --- Professional ---                           K44.9, Diaphragmatic hernia without obstruction or                            gangrene                           K21.00, Gastro-esophageal reflux disease with                            esophagitis, without bleeding                           K92.2, Gastrointestinal hemorrhage, unspecified                           K26.4, Chronic or unspecified duodenal ulcer with                            hemorrhage  K92.0, Hematemesis CPT  copyright 2022 American Medical Association. All rights reserved. The codes documented in this report are preliminary and upon coder review may  be revised to meet current compliance requirements. Viviann Spare P. Dandrea Medders, MD 12/21/2022 10:23:01 AM This report has been signed electronically. Number of Addenda: 0

## 2022-12-22 ENCOUNTER — Ambulatory Visit: Payer: Medicare Other

## 2022-12-22 DIAGNOSIS — K922 Gastrointestinal hemorrhage, unspecified: Secondary | ICD-10-CM | POA: Diagnosis not present

## 2022-12-22 DIAGNOSIS — Z7902 Long term (current) use of antithrombotics/antiplatelets: Secondary | ICD-10-CM | POA: Diagnosis not present

## 2022-12-22 DIAGNOSIS — K269 Duodenal ulcer, unspecified as acute or chronic, without hemorrhage or perforation: Secondary | ICD-10-CM | POA: Diagnosis not present

## 2022-12-22 DIAGNOSIS — D62 Acute posthemorrhagic anemia: Secondary | ICD-10-CM | POA: Diagnosis not present

## 2022-12-22 LAB — CBC WITH DIFFERENTIAL/PLATELET
Abs Immature Granulocytes: 0.04 10*3/uL (ref 0.00–0.07)
Basophils Absolute: 0 10*3/uL (ref 0.0–0.1)
Basophils Relative: 0 %
Eosinophils Absolute: 0 10*3/uL (ref 0.0–0.5)
Eosinophils Relative: 0 %
HCT: 27.4 % — ABNORMAL LOW (ref 39.0–52.0)
Hemoglobin: 9 g/dL — ABNORMAL LOW (ref 13.0–17.0)
Immature Granulocytes: 1 %
Lymphocytes Relative: 9 %
Lymphs Abs: 0.7 10*3/uL (ref 0.7–4.0)
MCH: 31.7 pg (ref 26.0–34.0)
MCHC: 32.8 g/dL (ref 30.0–36.0)
MCV: 96.5 fL (ref 80.0–100.0)
Monocytes Absolute: 0.7 10*3/uL (ref 0.1–1.0)
Monocytes Relative: 10 %
Neutro Abs: 5.7 10*3/uL (ref 1.7–7.7)
Neutrophils Relative %: 80 %
Platelets: 112 10*3/uL — ABNORMAL LOW (ref 150–400)
RBC: 2.84 MIL/uL — ABNORMAL LOW (ref 4.22–5.81)
RDW: 16.2 % — ABNORMAL HIGH (ref 11.5–15.5)
WBC: 7 10*3/uL (ref 4.0–10.5)
nRBC: 0 % (ref 0.0–0.2)

## 2022-12-22 LAB — BASIC METABOLIC PANEL
Anion gap: 9 (ref 5–15)
BUN: 51 mg/dL — ABNORMAL HIGH (ref 8–23)
CO2: 19 mmol/L — ABNORMAL LOW (ref 22–32)
Calcium: 8.1 mg/dL — ABNORMAL LOW (ref 8.9–10.3)
Chloride: 109 mmol/L (ref 98–111)
Creatinine, Ser: 1.45 mg/dL — ABNORMAL HIGH (ref 0.61–1.24)
GFR, Estimated: 52 mL/min — ABNORMAL LOW (ref >60)
Glucose, Bld: 163 mg/dL — ABNORMAL HIGH (ref 70–99)
Potassium: 5.2 mmol/L — ABNORMAL HIGH (ref 3.5–5.1)
Sodium: 137 mmol/L (ref 135–145)

## 2022-12-22 LAB — GLUCOSE, CAPILLARY
Glucose-Capillary: 123 mg/dL — ABNORMAL HIGH (ref 70–99)
Glucose-Capillary: 134 mg/dL — ABNORMAL HIGH (ref 70–99)
Glucose-Capillary: 139 mg/dL — ABNORMAL HIGH (ref 70–99)
Glucose-Capillary: 150 mg/dL — ABNORMAL HIGH (ref 70–99)
Glucose-Capillary: 165 mg/dL — ABNORMAL HIGH (ref 70–99)
Glucose-Capillary: 204 mg/dL — ABNORMAL HIGH (ref 70–99)

## 2022-12-22 LAB — MAGNESIUM: Magnesium: 2.1 mg/dL (ref 1.7–2.4)

## 2022-12-22 LAB — HEMOGLOBIN AND HEMATOCRIT, BLOOD
HCT: 26.7 % — ABNORMAL LOW (ref 39.0–52.0)
Hemoglobin: 8.8 g/dL — ABNORMAL LOW (ref 13.0–17.0)

## 2022-12-22 MED ORDER — SODIUM CHLORIDE 0.9 % IV SOLN: INTRAVENOUS | Status: AC

## 2022-12-22 NOTE — Plan of Care (Signed)

## 2022-12-22 NOTE — Care Management Important Message (Signed)
Important Message  Patient Details  Name: [name] Cook MRN: [id number] Date of Birth: 1950-11-19   Medicare Important Message Given:  Yes     Dorena Bodo 12/22/2022, 2:35 PM

## 2022-12-22 NOTE — Progress Notes (Signed)
Mobility Specialist Progress Note:   12/22/22 1549  Mobility  Activity Ambulated with assistance in room  Level of Assistance Contact guard assist, steadying assist  Assistive Device Front wheel walker  Distance Ambulated (ft) 30 ft (15+15)  Activity Response Tolerated well  Mobility Referral Yes  $Mobility charge 1 Mobility  Mobility Specialist Start Time (ACUTE ONLY) 1528  Mobility Specialist Stop Time (ACUTE ONLY) 1548  Mobility Specialist Time Calculation (min) (ACUTE ONLY) 20 min    Pre Mobility: 70 HR During Mobility: 85 HR Post Mobility:  72 HR  Pt received in bed, hesitant but agreeable to mobility. Mod I for bed mobility. Denied usual dizziness but c/o some weakness. VSS throughout. Pt left in bed with call bell and all needs met.  [place] Earlene Plater Mobility Specialist Please contact via Special educational needs teacher or Rehab office at [phone]

## 2022-12-22 NOTE — Progress Notes (Signed)
[facility] Progress Note  CC:  Hematemesis, lower abdominal pain   Subjective: Last dark red bloody BM with clots was yesterday evening around 6pm as confirmed by his RN. He denies having any N/V. No abdominal pain. He felt a little lightheaded when he got up to the commode yesterday evening. No CP or  SOB.    Objective:   EGD 95/2024: - Small hiatal hernia.  - LA Grade A reflux esophagitis  - Normal esophagus otherwise.  - Red blood and clot in the gastric fundus, in the gastric antrum and in the pylorus. Lavaged without any active bleeding noted (small area of cardia / fundus not seen due to residual clot but not entirely cleared given active bleeding treated in the duodenum)  - Blood in the duodenal bulb and in the second portion of the duodenum.  - Actively bleeding duodenal ulcer (Forrest Class Ia) in the second portion of the duodenum treated with epinephrine injection, coagulation using Goldprobe, and then Purastat. Hemostasis achieved.  - A few other smaller clean based superficial ulcers in the second portion of the duodenum. - Biopsies not taken given recent severe bleeding and recent Plavix use  Vital signs in last 24 hours: Temp:  [96.7 F (35.9 C)-98.7 F (37.1 C)] 98.7 F (37.1 C) (09/06 0720) Pulse Rate:  [69-71] 70 (09/06 0720) Resp:  [11-17] 17 (09/06 0720) BP: (96-160)/(58-105) 113/63 (09/06 0720) SpO2:  [98 %-100 %] 98 % (09/06 0720) Weight:  [89.6 kg] 89.6 kg (09/06 0324) Last BM Date : 12/21/22 General: 72 year old male alert and oriented in NAD. Heart: RRR, no murmur. Pulm: Breath sounds clear  throughout.  Abdomen: Soft, nontender. Nondistended. Positive bowel sounds x 4 quadrants.  Extremities:  Without edema. Neurologic:  Alert and  oriented x 4. Grossly normal neurologically. Psych:  Alert and cooperative. Normal mood and affect.  Intake/Output from previous day: 09/05 0701 - 09/06 0700 In: 1366.9 [I.V.:801.9; Blood:315; IV  Piggyback:250] Out: 1350 [Urine:1350] Intake/Output this shift: No intake/output data recorded.  Lab Results: Recent Labs    12/20/22 2116 12/21/22 0218 12/21/22 1115 12/21/22 1835 12/22/22 0206  WBC 7.6 4.8  --   --  7.0  HGB 12.7* 11.3* 10.4* 9.4* 9.0*  HCT 39.0 34.5* 32.0* 28.6* 27.4*  PLT 115* 104*  --   --  112*   BMET Recent Labs    12/20/22 2116 12/21/22 0218 12/22/22 0206  NA 137 138 137  K 4.5 4.8 5.2*  CL 106 110 109  CO2 20* 23 19*  GLUCOSE 144* 97 163*  BUN 34* 34* 51*  CREATININE 1.18 1.15 1.45*  CALCIUM 8.6* 8.0* 8.1*   LFT Recent Labs    12/21/22 0218  PROT 4.9*  ALBUMIN 3.1*  AST 16  ALT 18  ALKPHOS 39  BILITOT 0.8   PT/INR No results for input(s): "LABPROT", "INR" in the last 72 hours. Hepatitis Panel No results for input(s): "HEPBSAG", "HCVAB", "HEPAIGM", "HEPBIGM" in the last 72 hours.  CT ANGIO GI BLEED  Result Date: 12/20/2022 CLINICAL DATA:  lower abdominal pain, hematemesis EXAM: CTA ABDOMEN AND PELVIS WITHOUT AND WITH CONTRAST TECHNIQUE: Multidetector CT imaging of the abdomen and pelvis was performed using the standard protocol during bolus administration of intravenous contrast. Multiplanar reconstructed images and MIPs were obtained and reviewed to evaluate the vascular anatomy. RADIATION DOSE REDUCTION: This exam was performed according to the departmental dose-optimization program which includes automated exposure control, adjustment of the mA and/or kV according to  patient size and/or use of iterative reconstruction technique. CONTRAST:  75mL OMNIPAQUE IOHEXOL 350 MG/ML SOLN COMPARISON:  None Available. FINDINGS: VASCULAR No extravasation of intravenous contrast within the lumen of the bowel. Aorta: Severe degenerative changes of the spine. 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 No dissection, vasculitis or significant stenosis. Celiac: Mild to moderate atherosclerotic plaque. Patent without evidence of aneurysm,  dissection, vasculitis or significant stenosis. ZOX:WRUE to moderate atherosclerotic plaque. Patent without evidence of aneurysm, dissection, vasculitis or significant stenosis. Renals: Mild to moderate atherosclerotic plaque.Both renal arteries are patent without evidence of aneurysm, dissection, vasculitis, fibromuscular dysplasia or significant stenosis. IMA: Patent without evidence of aneurysm, dissection, vasculitis or significant stenosis. Inflow: Severe atherosclerotic plaque. Patent without evidence of aneurysm, dissection, vasculitis or significant stenosis. Proximal Outflow: Moderate severe atherosclerotic plaque. Bilateral common femoral and visualized portions of the superficial and profunda femoral arteries are patent without evidence of aneurysm, dissection, vasculitis or significant stenosis. Veins: The main portal, splenic, superior mesenteric veins are patent. Review of the MIP images confirms the above findings. NON-VASCULAR Lower chest: No acute abnormality. Coronary artery calcifications. Cardiac leads. Coronary artery stent. Hepatobiliary: No focal liver abnormality. Calcified gallstone noted within the gallbladder lumen. No gallbladder wall thickening or pericholecystic fluid. No biliary dilatation. Pancreas: No focal lesion. Normal pancreatic contour. No surrounding inflammatory changes. No main pancreatic ductal dilatation. Spleen: Normal in size without focal abnormality. Adrenals/Urinary Tract: No adrenal nodule bilaterally. Bilateral kidneys enhance symmetrically. Subcentimeter hypodensity too small to characterize-no further follow-up indicated. No hydronephrosis. No hydroureter. The urinary bladder is unremarkable. Stomach/Bowel: Stomach is within normal limits. No evidence of bowel wall thickening or dilatation. Colonic diverticulosis. The appendix is not definitely identified with no inflammatory changes in the right lower quadrant to suggest acute appendicitis. Lymphatic: No  lymphadenopathy. Reproductive: Prostate is unremarkable. Other: No intraperitoneal free fluid. No intraperitoneal free gas. No organized fluid collection. Musculoskeletal: No intraperitoneal free fluid. No intraperitoneal free gas. No organized fluid collection. IMPRESSION: VASCULAR 1. No CT evidence of acute gastrointestinal hemorrhage. No abdominal wall hernia or abnormality. No suspicious lytic or blastic osseous lesions. No acute displaced fracture. Multilevel degenerative changes of the spine. . This recommendation follows ACR consensus guidelines: White Paper of the ACR Incidental Findings Committee II on Vascular Findings. J Am Coll Radiol 2013; 10:789-794. NON-VASCULAR 1. Cholelithiasis with no CT finding of acute cholecystitis. 2. Colonic diverticulosis with no acute diverticulitis. Electronically Signed   By: Tish Frederickson M.D.   On: 12/20/2022 23:44    Assessment / Plan:  72 year old male admitted to the hospital 9/4 with acute UGI bleed, coffee ground emesis 9/4 with bright red hematemesis x dark red hematochezia. Last dose of Plavix was 6:30Am on 9/4. Admission Hg 12.7 -> 11.3 -> 10.4 -> today Hg 9.0. BUN 34 -> 51. Patient demonstrated active GI bleeding (large volume dark red blood with clots per the rectum) 9/5 with associated hypotension, received 1 unit of PRBCs and underwent an emergent EGD which identified grade A reflux esophagitis and a duodenal ulcer with active bleeding in the second portion of the duodenum treated with epi injection, coagulation using Goldprobe and Purastat with successful hemostasis and a few other small clean-based superficial ulcers were also identified in the second portion of the duodenum, biopsies not obtained in the setting of active bleeding. Last episode of dark red hematochezia with blood clots was 6pm yesterday evening without recurrence. Hemodynamically stable.  -NPO -Continue PPI infusion   -H/H 8AM -Hg continues to  drift downward. Hg 9.0 and BUN 34 ->  51 this morning, concerning for ongoing UGI bleed -May require repeat EGD today, to clarify further after repeat H/H received -Continue to monitor patient closely for active GI bleeding  CAD, Afib. Plavix discontinued 9/4.  AKI. Cr 1.15 -> 145 in setting of NPO status post EGD. -NS IV @ 75 cc/hr        Principal Problem:   Upper GI bleed Active Problems:   Antiplatelet or antithrombotic long-term use   Anemia, posthemorrhagic, acute   Duodenal ulcer     LOS: 2 days   [name] Cook  12/22/2022, 8:05AM

## 2022-12-22 NOTE — Progress Notes (Signed)
PROGRESS NOTE    [name] Cook  QVZ:[id number] DOB: [birth year] DOA: 12/20/2022 PCP: [name], NP   Brief Narrative:  73 y.o. male with medical history significant of  CHB s/p PPM, CAD s/p previous PCI to RCA in 2016, DM2, HLD, HTN, OSA, tobacco use, history of PE (02/2022) status post pulmonary 6 months of treatment; atrial flutter/atrial fibrillation previously on full dose oral anticoagulation complicated by history of GI bleeding with subsequent Watchman procedure on 06/22/2022 presented with coffee-ground emesis and abdominal pain.  On presentation, hemoglobin was 12.7.  CTA of abdomen showed no CT evidence of acute gastrointestinal hemorrhage.  He was started on Protonix drip.  GI was consulted.  He underwent EGD on 12/20/2022.  Assessment & Plan:   Possible upper GI bleeding Acute blood loss anemia -Has a history of prior GI bleed on oral anticoagulation -Was recently on Plavix and cardiology had stopped Plavix on 12/20/2022 and recommended to start aspirin 81 mg daily. -Hemoglobin 12.7 on presentation.  Received 1 unit of packed red cell transfusion on 12/21/2022 because of active bleeding and hypotension.  Hemoglobin 9.0 this morning.   -Continue IV Protonix.   -GI following: Underwent EGD on 12/20/2022: Showed actively bleeding duodenal ulcer treated with epinephrine injection, anticoagulation along with few other smaller clean-based superficial ulcers in the second portion of the duodenum along with LA grade a reflux esophagitis.  Follow further GI recommendations.  Currently NPO.  Paroxysmal atrial fibrillation/flutter -Status post LAAO with Watchman on 06/22/2022.  Recent cardiology plan as above.  Hold off on aspirin till clearance from GI. -Currently rate controlled  Hyperkalemia -Mild.  Monitor.  AKI Acute metabolic acidosis -Possibly from prerenal cause from GI bleeding and hypotension.  -Continue IV fluids.  CAD status post MI status post PCI Hypertension -History of PCI  to RCA in 2016 -Currently chest pain-free.  Blood pressure currently stable: Imdur on hold.  Outpatient follow-up with cardiology  History of complete heart block status post pacemaker -Outpatient follow-up cardiology/EP  Thrombocytopenia -Questionable cause.  Monitor.  Diabetes mellitus type 2 -Continue CBGs with SSI.  Hyperlipidemia -Resume statin once able to tolerate oral  OSA -Continue CPAP at bedtime  History of PE in 02/2022 -Status post 6 months of treatment  DVT prophylaxis: SCDs Code Status: Full Family Communication: None at bedside Disposition Plan: Status is: Inpatient Remains inpatient appropriate because: Of severity of illness    Consultants: GI  Procedures:  EGD on 12/21/2022 Impression:               - Small hiatal hernia.                           - LA Grade A reflux esophagitis                           - Normal esophagus otherwise.                           - Red blood and clot in the gastric fundus, in the                            gastric antrum and in the pylorus. Lavaged without  any active bleeding noted (small area of cardia /                            fundus not seen due to residual clot but not                            entirely cleared given active bleeding treated in                            the duodenum)                           - Blood in the duodenal bulb and in the second                            portion of the duodenum.                           - Actively bleeding duodenal ulcer (Forrest Class                            Ia) in the second portion of the duodenum treated                            with epinephrine injection, coagulation using                            Goldprobe, and then Purastat. Hemostasis achieved.                           - A few other smaller clean based superficial                            ulcers in the second portion of the duodenum.                           - Biopsies  not taken given recent severe bleeding                            and recent Plavix use. Recommendation:           - Return patient to hospital ward for ongoing care.                           - Okay to extubate                           - NPO today given recent endoscopic treatment and                            risk for rebleeding.                           - Continue present medications.                           -  Continue IV protonix drip for now                           - Monitor Hgb, response to transfusion                           - Avoid all NSAIDs                           - Continue to hold Plavix (patient states just                            yesterday his cardiologist stopped it and no plans                            to resume it)                           - GI service will follow, call with questions or                            recurrent bleeding in the interim.  Antimicrobials: None   Subjective: Patient seen and examined at bedside.  Denies any current chest pain, fever, vomiting or worsening shortness of breath.   Objective: Vitals:   12/21/22 2018 12/21/22 2303 12/22/22 0324 12/22/22 0720  BP: 128/66 (!) 127/59 124/61 113/63  Pulse:    70  Resp:  14 16 17   Temp: 98.3 F (36.8 C) 98.2 F (36.8 C) 98.4 F (36.9 C) 98.7 F (37.1 C)  TempSrc: Oral Oral Oral Axillary  SpO2:    98%  Weight:   89.6 kg   Height:        Intake/Output Summary (Last 24 hours) at 12/22/2022 0738 Last data filed at 12/22/2022 0600 Gross per 24 hour  Intake 1366.93 ml  Output 1350 ml  Net 16.93 ml   Filed Weights   12/21/22 0055 12/21/22 0524 12/22/22 0324  Weight: 92.4 kg 90.3 kg 89.6 kg    Examination:  General: Currently on room air.  No distress ENT/neck: No thyromegaly.  JVD is not elevated  respiratory: Decreased breath sounds at bases bilaterally with some crackles; no wheezing  CVS: S1-S2 heard, rate controlled Abdominal: Soft, nontender, slightly distended; no  organomegaly, bowel sounds are heard Extremities: Trace lower extremity edema; no cyanosis  CNS: Alert; still slow to respond.  No focal neurologic deficit.  Moves extremities Lymph: No obvious lymphadenopathy Skin: No obvious ecchymosis/lesions  psych: Currently not agitated.  Mostly flat affect. musculoskeletal: No obvious joint swelling/deformity   Data Reviewed: I have personally reviewed following labs and imaging studies  CBC: Recent Labs  Lab 12/20/22 2116 12/21/22 0218 12/21/22 1115 12/21/22 1835 12/22/22 0206  WBC 7.6 4.8  --   --  7.0  NEUTROABS 6.1  --   --   --  5.7  HGB 12.7* 11.3* 10.4* 9.4* 9.0*  HCT 39.0 34.5* 32.0* 28.6* 27.4*  MCV 96.3 95.3  --   --  96.5  PLT 115* 104*  --   --  112*   Basic Metabolic Panel: Recent Labs  Lab 12/20/22 2116 12/21/22 0218 12/22/22 0206  NA 137 138 137  K 4.5 4.8 5.2*  CL  106 110 109  CO2 20* 23 19*  GLUCOSE 144* 97 163*  BUN 34* 34* 51*  CREATININE 1.18 1.15 1.45*  CALCIUM 8.6* 8.0* 8.1*  MG  --   --  2.1   GFR: Estimated Creatinine Clearance: 50.5 mL/min (A) (by C-G formula based on SCr of 1.45 mg/dL (H)). Liver Function Tests: Recent Labs  Lab 12/20/22 2116 12/21/22 0218  AST 18 16  ALT 19 18  ALKPHOS 40 39  BILITOT 0.4 0.8  PROT 5.3* 4.9*  ALBUMIN 3.3* 3.1*   Recent Labs  Lab 12/20/22 2116  LIPASE 35   No results for input(s): "AMMONIA" in the last 168 hours. Coagulation Profile: No results for input(s): "INR", "PROTIME" in the last 168 hours. Cardiac Enzymes: No results for input(s): "CKTOTAL", "CKMB", "CKMBINDEX", "TROPONINI" in the last 168 hours. BNP (last 3 results) No results for input(s): "PROBNP" in the last 8760 hours. HbA1C: No results for input(s): "HGBA1C" in the last 72 hours. CBG: Recent Labs  Lab 12/21/22 1552 12/21/22 2016 12/21/22 2302 12/22/22 0322 12/22/22 0722  GLUCAP 170* 178* 174* 139* 150*   Lipid Profile: No results for input(s): "CHOL", "HDL", "LDLCALC",  "TRIG", "CHOLHDL", "LDLDIRECT" in the last 72 hours. Thyroid Function Tests: No results for input(s): "TSH", "T4TOTAL", "FREET4", "T3FREE", "THYROIDAB" in the last 72 hours. Anemia Panel: No results for input(s): "VITAMINB12", "FOLATE", "FERRITIN", "TIBC", "IRON", "RETICCTPCT" in the last 72 hours. Sepsis Labs: No results for input(s): "PROCALCITON", "LATICACIDVEN" in the last 168 hours.  Recent Results (from the past 240 hour(s))  MRSA Next Gen by PCR, Nasal     Status: Abnormal   Collection Time: 12/21/22 12:55 AM   Specimen: Nasal Mucosa; Nasal Swab  Result Value Ref Range Status   MRSA by PCR Next Gen DETECTED (A) NOT DETECTED Final    Comment: RESULTS CALLED TO, READ BACK BY AND VERIFIED WITH RN A.COOPER ON 12/21/22 AT 0301 BY NM (NOTE) The GeneXpert MRSA Assay (FDA approved for NASAL specimens only), is one component of a comprehensive MRSA colonization surveillance program. It is not intended to diagnose MRSA infection nor to guide or monitor treatment for MRSA infections. Test performance is not FDA approved in patients less than 86 years old. Performed at [facility], [address], [place], [place] [address]          Radiology Studies: CT ANGIO GI BLEED  Result Date: 12/20/2022 CLINICAL DATA:  lower abdominal pain, hematemesis EXAM: CTA ABDOMEN AND PELVIS WITHOUT AND WITH CONTRAST TECHNIQUE: Multidetector CT imaging of the abdomen and pelvis was performed using the standard protocol during bolus administration of intravenous contrast. Multiplanar reconstructed images and MIPs were obtained and reviewed to evaluate the vascular anatomy. RADIATION DOSE REDUCTION: This exam was performed according to the departmental dose-optimization program which includes automated exposure control, adjustment of the mA and/or kV according to patient size and/or use of iterative reconstruction technique. CONTRAST:  75mL OMNIPAQUE IOHEXOL 350 MG/ML SOLN COMPARISON:  None Available.  FINDINGS: VASCULAR No extravasation of intravenous contrast within the lumen of the bowel. Aorta: Severe degenerative changes of the spine. 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 No dissection, vasculitis or significant stenosis. Celiac: Mild to moderate atherosclerotic plaque. Patent without evidence of aneurysm, dissection, vasculitis or significant stenosis. UEA:VWUJ to moderate atherosclerotic plaque. Patent without evidence of aneurysm, dissection, vasculitis or significant stenosis. Renals: Mild to moderate atherosclerotic plaque.Both renal arteries are patent without evidence of aneurysm, dissection, vasculitis, fibromuscular dysplasia or significant stenosis. IMA:  Patent without evidence of aneurysm, dissection, vasculitis or significant stenosis. Inflow: Severe atherosclerotic plaque. Patent without evidence of aneurysm, dissection, vasculitis or significant stenosis. Proximal Outflow: Moderate severe atherosclerotic plaque. Bilateral common femoral and visualized portions of the superficial and profunda femoral arteries are patent without evidence of aneurysm, dissection, vasculitis or significant stenosis. Veins: The main portal, splenic, superior mesenteric veins are patent. Review of the MIP images confirms the above findings. NON-VASCULAR Lower chest: No acute abnormality. Coronary artery calcifications. Cardiac leads. Coronary artery stent. Hepatobiliary: No focal liver abnormality. Calcified gallstone noted within the gallbladder lumen. No gallbladder wall thickening or pericholecystic fluid. No biliary dilatation. Pancreas: No focal lesion. Normal pancreatic contour. No surrounding inflammatory changes. No main pancreatic ductal dilatation. Spleen: Normal in size without focal abnormality. Adrenals/Urinary Tract: No adrenal nodule bilaterally. Bilateral kidneys enhance symmetrically. Subcentimeter hypodensity too small to characterize-no further follow-up indicated. No  hydronephrosis. No hydroureter. The urinary bladder is unremarkable. Stomach/Bowel: Stomach is within normal limits. No evidence of bowel wall thickening or dilatation. Colonic diverticulosis. The appendix is not definitely identified with no inflammatory changes in the right lower quadrant to suggest acute appendicitis. Lymphatic: No lymphadenopathy. Reproductive: Prostate is unremarkable. Other: No intraperitoneal free fluid. No intraperitoneal free gas. No organized fluid collection. Musculoskeletal: No intraperitoneal free fluid. No intraperitoneal free gas. No organized fluid collection. IMPRESSION: VASCULAR 1. No CT evidence of acute gastrointestinal hemorrhage. No abdominal wall hernia or abnormality. No suspicious lytic or blastic osseous lesions. No acute displaced fracture. Multilevel degenerative changes of the spine. . This recommendation follows ACR consensus guidelines: White Paper of the ACR Incidental Findings Committee II on Vascular Findings. J Am Coll Radiol 2013; 10:789-794. NON-VASCULAR 1. Cholelithiasis with no CT finding of acute cholecystitis. 2. Colonic diverticulosis with no acute diverticulitis. Electronically Signed   By: Tish Frederickson M.D.   On: 12/20/2022 23:44        Scheduled Meds:  sodium chloride   Intravenous Once   influenza vaccine adjuvanted  0.5 mL Intramuscular Tomorrow-1000   insulin aspart  0-9 Units Subcutaneous Q4H   isosorbide mononitrate  60 mg Oral Daily   [START ON 12/24/2022] pantoprazole  40 mg Intravenous Q12H   Continuous Infusions:  sodium chloride 75 mL/hr at 12/22/22 0559   lactated ringers Stopped (12/22/22 0600)   pantoprazole 8 mg/hr (12/22/22 9604)          Glade Lloyd, MD [facility] 12/22/2022, 7:38 AM

## 2022-12-23 ENCOUNTER — Encounter (HOSPITAL_COMMUNITY)
Admission: EM | Disposition: A | Discharge status: Home / Self Care | Payer: Self-pay | Source: Home / Self Care | Attending: Internal Medicine

## 2022-12-23 ENCOUNTER — Inpatient Hospital Stay (HOSPITAL_COMMUNITY): Payer: 59 | Admitting: Anesthesiology

## 2022-12-23 ENCOUNTER — Encounter (HOSPITAL_COMMUNITY): Payer: Self-pay | Admitting: Internal Medicine

## 2022-12-23 DIAGNOSIS — D62 Acute posthemorrhagic anemia: Secondary | ICD-10-CM | POA: Diagnosis not present

## 2022-12-23 DIAGNOSIS — K922 Gastrointestinal hemorrhage, unspecified: Secondary | ICD-10-CM | POA: Diagnosis not present

## 2022-12-23 DIAGNOSIS — K209 Esophagitis, unspecified without bleeding: Secondary | ICD-10-CM | POA: Diagnosis not present

## 2022-12-23 DIAGNOSIS — K264 Chronic or unspecified duodenal ulcer with hemorrhage: Secondary | ICD-10-CM

## 2022-12-23 DIAGNOSIS — Z7902 Long term (current) use of antithrombotics/antiplatelets: Secondary | ICD-10-CM | POA: Diagnosis not present

## 2022-12-23 DIAGNOSIS — K449 Diaphragmatic hernia without obstruction or gangrene: Secondary | ICD-10-CM

## 2022-12-23 DIAGNOSIS — K269 Duodenal ulcer, unspecified as acute or chronic, without hemorrhage or perforation: Secondary | ICD-10-CM | POA: Diagnosis not present

## 2022-12-23 HISTORY — PX: ESOPHAGOGASTRODUODENOSCOPY (EGD) WITH PROPOFOL: SHX5813

## 2022-12-23 HISTORY — PX: HEMOSTASIS CLIP PLACEMENT: SHX6857

## 2022-12-23 LAB — CBC
HCT: 22.7 % — ABNORMAL LOW (ref 39.0–52.0)
Hemoglobin: 7.4 g/dL — ABNORMAL LOW (ref 13.0–17.0)
MCH: 31.1 pg (ref 26.0–34.0)
MCHC: 32.6 g/dL (ref 30.0–36.0)
MCV: 95.4 fL (ref 80.0–100.0)
Platelets: 90 10*3/uL — ABNORMAL LOW (ref 150–400)
RBC: 2.38 MIL/uL — ABNORMAL LOW (ref 4.22–5.81)
RDW: 16.5 % — ABNORMAL HIGH (ref 11.5–15.5)
WBC: 5.5 10*3/uL (ref 4.0–10.5)
nRBC: 0 % (ref 0.0–0.2)

## 2022-12-23 LAB — BASIC METABOLIC PANEL
Anion gap: 8 (ref 5–15)
BUN: 27 mg/dL — ABNORMAL HIGH (ref 8–23)
CO2: 19 mmol/L — ABNORMAL LOW (ref 22–32)
Calcium: 7.6 mg/dL — ABNORMAL LOW (ref 8.9–10.3)
Chloride: 106 mmol/L (ref 98–111)
Creatinine, Ser: 1.09 mg/dL (ref 0.61–1.24)
GFR, Estimated: >60 mL/min (ref >60)
Glucose, Bld: 129 mg/dL — ABNORMAL HIGH (ref 70–99)
Potassium: 3.7 mmol/L (ref 3.5–5.1)
Sodium: 133 mmol/L — ABNORMAL LOW (ref 135–145)

## 2022-12-23 LAB — GLUCOSE, CAPILLARY
Glucose-Capillary: 138 mg/dL — ABNORMAL HIGH (ref 70–99)
Glucose-Capillary: 319 mg/dL — ABNORMAL HIGH (ref 70–99)
Glucose-Capillary: 87 mg/dL (ref 70–99)
Glucose-Capillary: 254 mg/dL — ABNORMAL HIGH (ref 70–99)
Glucose-Capillary: 87 mg/dL (ref 70–99)
Glucose-Capillary: 108 mg/dL — ABNORMAL HIGH (ref 70–99)

## 2022-12-23 LAB — HEMOGLOBIN: Hemoglobin: 8 g/dL — ABNORMAL LOW (ref 13.0–17.0)

## 2022-12-23 SURGERY — ESOPHAGOGASTRODUODENOSCOPY (EGD) WITH PROPOFOL
Anesthesia: Monitor Anesthesia Care

## 2022-12-23 MED ORDER — PROPOFOL 500 MG/50ML IV EMUL
INTRAVENOUS | Status: DC | PRN
  Administered 2022-12-23: 180 ug/kg/min via INTRAVENOUS

## 2022-12-23 MED ORDER — PROPOFOL 10 MG/ML IV BOLUS
INTRAVENOUS | Status: DC | PRN
  Administered 2022-12-23 (×2): 20 mg via INTRAVENOUS

## 2022-12-23 MED ORDER — LIDOCAINE 2% (20 MG/ML) 5 ML SYRINGE
INTRAMUSCULAR | Status: DC | PRN
  Administered 2022-12-23: 40 mg via INTRAVENOUS

## 2022-12-23 SURGICAL SUPPLY — 15 items

## 2022-12-23 NOTE — Progress Notes (Signed)
PROGRESS NOTE    [name] Cook  RUE:[id number] DOB: April 02, 1951 DOA: 12/20/2022 PCP: [name], NP   Brief Narrative:  72 y.o. male with medical history significant of  CHB s/p PPM, CAD s/p previous PCI to RCA in 2016, DM2, HLD, HTN, OSA, tobacco use, history of PE (02/2022) status post pulmonary 6 months of treatment; atrial flutter/atrial fibrillation previously on full dose oral anticoagulation complicated by history of GI bleeding with subsequent Watchman procedure on 06/22/2022 presented with coffee-ground emesis and abdominal pain.  On presentation, hemoglobin was 12.7.  CTA of abdomen showed no CT evidence of acute gastrointestinal hemorrhage.  He was started on Protonix drip.  GI was consulted.  He underwent EGD on 12/20/2022.  Assessment & Plan:   Possible upper GI bleeding Acute blood loss anemia -Has a history of prior GI bleed on oral anticoagulation -Was recently on Plavix and cardiology had stopped Plavix on 12/20/2022 and recommended to start aspirin 81 mg daily. -Hemoglobin 12.7 on presentation.  Received 1 unit of packed red cell transfusion on 12/21/2022 because of active bleeding and hypotension.  Hemoglobin 7.4 this morning.  Monitor H&H. -Continue IV Protonix.   -GI following: Underwent EGD on 12/20/2022: Showed actively bleeding duodenal ulcer treated with epinephrine injection, anticoagulation along with few other smaller clean-based superficial ulcers in the second portion of the duodenum along with LA grade a reflux esophagitis.  Follow further GI recommendations.  Currently on clear liquid diet as per GI.  Paroxysmal atrial fibrillation/flutter -Status post LAAO with Watchman on 06/22/2022.  Recent cardiology plan as above.  Hold off on aspirin till clearance from GI. -Currently rate controlled  Hyperkalemia -Improved.  AKI Acute metabolic acidosis -Possibly from prerenal cause from GI bleeding and hypotension.  -Creatinine improved.  Bicarb still 19.  Monitor.  CAD  status post MI status post PCI Hypertension -History of PCI to RCA in 2016 -Currently chest pain-free.  Blood pressure currently stable: Imdur on hold.  Outpatient follow-up with cardiology  History of complete heart block status post pacemaker -Outpatient follow-up cardiology/EP  Thrombocytopenia -Questionable cause.  Monitor.  Diabetes mellitus type 2 -Continue CBGs with SSI.  Hyperlipidemia -Resume statin once able to tolerate oral  OSA -Continue CPAP at bedtime  History of PE in 02/2022 -Status post 6 months of treatment  DVT prophylaxis: SCDs Code Status: Full Family Communication: None at bedside Disposition Plan: Status is: Inpatient Remains inpatient appropriate because: Of severity of illness    Consultants: GI  Procedures:  EGD on 12/21/2022 Impression:               - Small hiatal hernia.                           - LA Grade A reflux esophagitis                           - Normal esophagus otherwise.                           - Red blood and clot in the gastric fundus, in the                            gastric antrum and in the pylorus. Lavaged without  any active bleeding noted (small area of cardia /                            fundus not seen due to residual clot but not                            entirely cleared given active bleeding treated in                            the duodenum)                           - Blood in the duodenal bulb and in the second                            portion of the duodenum.                           - Actively bleeding duodenal ulcer (Forrest Class                            Ia) in the second portion of the duodenum treated                            with epinephrine injection, coagulation using                            Goldprobe, and then Purastat. Hemostasis achieved.                           - A few other smaller clean based superficial                            ulcers in the second  portion of the duodenum.                           - Biopsies not taken given recent severe bleeding                            and recent Plavix use. Recommendation:           - Return patient to hospital ward for ongoing care.                           - Okay to extubate                           - NPO today given recent endoscopic treatment and                            risk for rebleeding.                           - Continue present medications.                           -  Continue IV protonix drip for now                           - Monitor Hgb, response to transfusion                           - Avoid all NSAIDs                           - Continue to hold Plavix (patient states just                            yesterday his cardiologist stopped it and no plans                            to resume it)                           - GI service will follow, call with questions or                            recurrent bleeding in the interim.  Antimicrobials: None   Subjective: Patient seen and examined at bedside.  No vomiting, worsening abdominal pain, fever reported. Objective: Vitals:   12/22/22 2023 12/22/22 2237 12/23/22 0351 12/23/22 0729  BP: (!) 112/54 124/62 123/69 123/61  Pulse:      Resp: 18 19  17   Temp: 98.1 F (36.7 C) 98.1 F (36.7 C) 97.9 F (36.6 C) 97.9 F (36.6 C)  TempSrc: Oral Oral Oral Oral  SpO2:      Weight:   88 kg   Height:        Intake/Output Summary (Last 24 hours) at 12/23/2022 0742 Last data filed at 12/23/2022 0730 Gross per 24 hour  Intake 1026.94 ml  Output 3325 ml  Net -2298.06 ml   Filed Weights   12/21/22 0524 12/22/22 0324 12/23/22 0351  Weight: 90.3 kg 89.6 kg 88 kg    Examination:  General: No acute distress.  On room air currently.   ENT/neck: No palpable neck masses or elevated JVD noted respiratory: Bilateral decreased breath sounds at bases with scattered crackles  CVS: Rate mostly controlled; S1 and S2 are heard   abdominal: Soft, nontender, distended mildly; no organomegaly, bowel sounds are heard normally Extremities: No clubbing; mild lower extremity edema present CNS: And alert.  Slow to respond.  No focal neurologic deficit.  Able to move extremities Lymph: No obvious palpable lymphadenopathy Skin: No obvious petechiae/rashes psych: Flat affect mostly.  Showing no signs of agitation musculoskeletal: No obvious joint erythema/tenderness   Data Reviewed: I have personally reviewed following labs and imaging studies  CBC: Recent Labs  Lab 12/20/22 2116 12/21/22 0218 12/21/22 1115 12/21/22 1835 12/22/22 0206 12/22/22 0825 12/23/22 0227  WBC 7.6 4.8  --   --  7.0  --  5.5  NEUTROABS 6.1  --   --   --  5.7  --   --   HGB 12.7* 11.3* 10.4* 9.4* 9.0* 8.8* 7.4*  HCT 39.0 34.5* 32.0* 28.6* 27.4* 26.7* 22.7*  MCV 96.3 95.3  --   --  96.5  --  95.4  PLT 115* 104*  --   --  112*  --  90*   Basic Metabolic Panel: Recent Labs  Lab 12/20/22 2116 12/21/22 0218 12/22/22 0206 12/23/22 0227  NA 137 138 137 133*  K 4.5 4.8 5.2* 3.7  CL 106 110 109 106  CO2 20* 23 19* 19*  GLUCOSE 144* 97 163* 129*  BUN 34* 34* 51* 27*  CREATININE 1.18 1.15 1.45* 1.09  CALCIUM 8.6* 8.0* 8.1* 7.6*  MG  --   --  2.1  --    GFR: Estimated Creatinine Clearance: 67.2 mL/min (by C-G formula based on SCr of 1.09 mg/dL). Liver Function Tests: Recent Labs  Lab 12/20/22 2116 12/21/22 0218  AST 18 16  ALT 19 18  ALKPHOS 40 39  BILITOT 0.4 0.8  PROT 5.3* 4.9*  ALBUMIN 3.3* 3.1*   Recent Labs  Lab 12/20/22 2116  LIPASE 35   No results for input(s): "AMMONIA" in the last 168 hours. Coagulation Profile: No results for input(s): "INR", "PROTIME" in the last 168 hours. Cardiac Enzymes: No results for input(s): "CKTOTAL", "CKMB", "CKMBINDEX", "TROPONINI" in the last 168 hours. BNP (last 3 results) No results for input(s): "PROBNP" in the last 8760 hours. HbA1C: No results for input(s): "HGBA1C" in the  last 72 hours. CBG: Recent Labs  Lab 12/22/22 1633 12/22/22 2022 12/22/22 2345 12/23/22 0352 12/23/22 0727  GLUCAP 165* 123* 134* 138* 319*   Lipid Profile: No results for input(s): "CHOL", "HDL", "LDLCALC", "TRIG", "CHOLHDL", "LDLDIRECT" in the last 72 hours. Thyroid Function Tests: No results for input(s): "TSH", "T4TOTAL", "FREET4", "T3FREE", "THYROIDAB" in the last 72 hours. Anemia Panel: No results for input(s): "VITAMINB12", "FOLATE", "FERRITIN", "TIBC", "IRON", "RETICCTPCT" in the last 72 hours. Sepsis Labs: No results for input(s): "PROCALCITON", "LATICACIDVEN" in the last 168 hours.  Recent Results (from the past 240 hour(s))  MRSA Next Gen by PCR, Nasal     Status: Abnormal   Collection Time: 12/21/22 12:55 AM   Specimen: Nasal Mucosa; Nasal Swab  Result Value Ref Range Status   MRSA by PCR Next Gen DETECTED (A) NOT DETECTED Final    Comment: RESULTS CALLED TO, READ BACK BY AND VERIFIED WITH RN A.COOPER ON 12/21/22 AT 0301 BY NM (NOTE) The GeneXpert MRSA Assay (FDA approved for NASAL specimens only), is one component of a comprehensive MRSA colonization surveillance program. It is not intended to diagnose MRSA infection nor to guide or monitor treatment for MRSA infections. Test performance is not FDA approved in patients less than 26 years old. Performed at [facility], [address] [place], [place] [address]          Radiology Studies: No results found.      Scheduled Meds:  sodium chloride   Intravenous Once   influenza vaccine adjuvanted  0.5 mL Intramuscular Tomorrow-1000   insulin aspart  0-9 Units Subcutaneous Q4H   isosorbide mononitrate  60 mg Oral Daily   [START ON 12/24/2022] pantoprazole  40 mg Intravenous Q12H   Continuous Infusions:  lactated ringers Stopped (12/22/22 0600)   pantoprazole 8 mg/hr (12/23/22 0223)          Glade Lloyd, MD [facility] 12/23/2022, 7:42 AM

## 2022-12-23 NOTE — Plan of Care (Signed)
One episode of dark maroon blood per rectum, f/u EGD done to r/o further bleeding.

## 2022-12-23 NOTE — Plan of Care (Signed)

## 2022-12-23 NOTE — Progress Notes (Signed)
Lab did not come to draw blood when order placed by GI, sent blood to lab at 1006. Called at 1030 and they had not checked the tube station. At 1143 called again and it is still processing. Updated [facility]. Pt is to be NPO until POC is known.

## 2022-12-23 NOTE — Progress Notes (Signed)
Mobility Specialist Progress Note:   12/23/22 1548  Mobility  Activity Ambulated with assistance in hallway  Level of Assistance Contact guard assist, steadying assist  Assistive Device Front wheel walker  Distance Ambulated (ft) 145 ft  Activity Response Tolerated well  Mobility Referral Yes  $Mobility charge 1 Mobility  Mobility Specialist Start Time (ACUTE ONLY) 1500  Mobility Specialist Stop Time (ACUTE ONLY) 1510  Mobility Specialist Time Calculation (min) (ACUTE ONLY) 10 min    During Mobility: 80 HR   Post Mobility: 75 HR , 91% SpO2  Pt received in bed, agreeable to mobility. MinG to stand. CG during ambulation. Denied any discomfort during ambulation, asymptomatic throughout. Pt returned to bed with call bell in reach and all needs met.  [name] Cook  Mobility Specialist Please contact via [employer] office at [phone]

## 2022-12-23 NOTE — Op Note (Signed)
[facility] Patient Name: [name] Cook Procedure Date : 12/23/2022 MRN: [id number] Attending MD: Willaim Rayas. Adela Lank , MD, [id number] Date of Birth: 22-Apr-1950 CSN: [id number] Age: 72 Admit Type: Inpatient Procedure:                Upper GI endoscopy Indications:              Suspected upper gastrointestinal bleeding - large                            duodenal ulcer with significant bleeding on 9/5 -                            treated endoscopically with epinephrine, cautery of                            bleeding vessel using Goldprobe, and the Purastat.                            Hgb has drifted since the procedure and passed                            additional blood per rectum (athough BUN also                            downtrending). Relook EGD to ensure no active                            bleeding Providers:                Viviann Spare P. Adela Lank, MD, Pati Gallo. Allred RN,                            RN, Adin Hector, RN, Brion Aliment, Technician Referring MD:              Medicines:                Monitored Anesthesia Care Complications:            No immediate complications. Estimated blood loss:                            Minimal. Estimated Blood Loss:     Estimated blood loss was minimal. Procedure:                Pre-Anesthesia Assessment:                           - Prior to the procedure, a History and Physical                            was performed, and patient medications and                            allergies were reviewed. The patient's tolerance of  previous anesthesia was also reviewed. The risks                            and benefits of the procedure and the sedation                            options and risks were discussed with the patient.                            All questions were answered, and informed consent                            was obtained. Prior Anticoagulants: The patient has                             taken Plavix (clopidogrel), last dose was 3 days                            prior to procedure. ASA Grade Assessment: III - A                            patient with severe systemic disease. After                            reviewing the risks and benefits, the patient was                            deemed in satisfactory condition to undergo the                            procedure.                           After obtaining informed consent, the endoscope was                            passed under direct vision. Throughout the                            procedure, the patient's blood pressure, pulse, and                            oxygen saturations were monitored continuously. The                            [id number]-[id number] ([id number]) Olympus endoscope was                            introduced through the mouth, and advanced to the                            second part of duodenum. The upper GI endoscopy was  accomplished without difficulty. The patient                            tolerated the procedure well. Scope In: Scope Out: Findings:      A small hiatal hernia was present.      LA Grade A esophagitis was found at the GEJ.      The exam of the esophagus was otherwise normal.      The entire examined stomach was normal.      One cratered duodenal ulcer with a flat red spot was found in the second       portion of the duodenum. This was the site of prior therapy. The ulcer       was actually extended circumferentially and rather thin for most of it       with exception of the larger base which was 10mm or so at largest       diameter. There was no heme noted anywhere. No active bleeding. It was       certainly less friable than previously noted on the last exam. Unclear       if this site truly rebled or not, but three hemostatic clips were       successfully placed at the site to close the based of the ulcer and       prevent rebleeding. Slightly proximal to  this area was another       circumferential very thin clean based ulceration and another 3mm clean       based ulcer - all in the second portion of the duodenum. The distal       duodenum was examined and normal - no heme noted anywhere.      The exam of the duodenum was otherwise normal. Impression:               - Small hiatal hernia.                           - LA Grade A esophagitis.                           - Normal esophagus otherwise.                           - Normal stomach.                           - Cratered duodenal ulcer with flat red spot at the                            base - site of treatment from prior therapy. I did                            not see any heme anywhere or stigmata of recent                            bleeding - unclear if he truly had rebleeding but 3                            clips were  placed at the site to try and close the                            base of the ulcer and prevent rebleeding.                           - 2 other smaller clean based ulcers in the second                            portion of the duodenum.                           Overall, unclear if he truly had rebleeding or not                            (BUN has continued to downtrend - unclear if he                            passed old blood this morning), but with Hgb drift                            I elected to treat the site to reduce risk for                            rebleeding. Recommendation:           - Return patient to hospital ward for ongoing care.                           - Full liquid diet.                           - Continue present medications.                           - Continue IV protonix drip                           - Continue to trend Hgb, transfuse for PRBC lower                            than 8                           - We will reassess him tomorrow, call with                            questions. Procedure Code(s):        --- Professional  ---                           [phone], Esophagogastroduodenoscopy, flexible,                            transoral; with control of bleeding, any method Diagnosis Code(s):        ---  Professional ---                           K44.9, Diaphragmatic hernia without obstruction or                            gangrene                           K20.90, Esophagitis, unspecified without bleeding                           K26.9, Duodenal ulcer, unspecified as acute or                            chronic, without hemorrhage or perforation CPT copyright 2022 American Medical Association. All rights reserved. The codes documented in this report are preliminary and upon coder review may  be revised to meet current compliance requirements. Viviann Spare P. Sieara Bremer, MD 12/23/2022 1:50:05 PM This report has been signed electronically. Number of Addenda: 0

## 2022-12-23 NOTE — Progress Notes (Signed)
Pt off the floor in endo.

## 2022-12-23 NOTE — Progress Notes (Addendum)
Progress Note   Subjective  Patient has not had any bowel movements, no blood per rectum. Tolerating clears. His Hgb has continued to drift but BUN also downtrending. He denies complaints other than fatigue.   Objective   Vital signs in last 24 hours: Temp:  [97.6 F (36.4 C)-98.1 F (36.7 C)] 97.9 F (36.6 C) (09/07 0729) Pulse Rate:  [70-80] 70 (09/06 1629) Resp:  [16-19] 17 (09/07 0729) BP: (108-124)/(51-69) 123/61 (09/07 0729) SpO2:  [95 %-98 %] 95 % (09/06 1629) Weight:  [88 kg] 88 kg (09/07 0351) Last BM Date : 12/21/22 General:    white male in NAD Neurologic:  Alert and oriented,  grossly normal neurologically. Psych:  Cooperative. Normal mood and affect.  Intake/Output from previous day: 09/06 0701 - 09/07 0700 In: 1026.9 [P.O.:358; I.V.:668.9] Out: 2925 [Urine:2925] Intake/Output this shift: Total I/O In: -  Out: 400 [Urine:400]  Lab Results: Recent Labs    12/21/22 0218 12/21/22 1115 12/22/22 0206 12/22/22 0825 12/23/22 0227  WBC 4.8  --  7.0  --  5.5  HGB 11.3*   < > 9.0* 8.8* 7.4*  HCT 34.5*   < > 27.4* 26.7* 22.7*  PLT 104*  --  112*  --  90*   < > = values in this interval not displayed.   BMET Recent Labs    12/21/22 0218 12/22/22 0206 12/23/22 0227  NA 138 137 133*  K 4.8 5.2* 3.7  CL 110 109 106  CO2 23 19* 19*  GLUCOSE 97 163* 129*  BUN 34* 51* 27*  CREATININE 1.15 1.45* 1.09  CALCIUM 8.0* 8.1* 7.6*   LFT Recent Labs    12/21/22 0218  PROT 4.9*  ALBUMIN 3.1*  AST 16  ALT 18  ALKPHOS 39  BILITOT 0.8   PT/INR No results for input(s): "LABPROT", "INR" in the last 72 hours.  Studies/Results: No results found.     Assessment / Plan:    72 y/o male here with the following:  Upper GI bleed secondary to duodenal ulcer Post hemorrhagic anemia Antiplatelet use  Large actively bleeding duodenal ulcer on EGD 9/5, significant amount of blood in the lumen of the small bowel in this exam with heavy clot burden.  Was  able to achieve endoscopic hemostasis with cauterization of visible bleeding vessel and then prophylactically placed Purastat into the ulcer bed to help rebleeding. No hemostasis clips were placed given I felt this could cause mucosal trauma at the site. There was no bleeding at the end of the procedure and the site looked good.   Clinically has done well since the procedure. No further bleeding symptoms. He passed some old blood following the exam. BUN downtrending but Hgb also drifted overnight. Will recheck a Hgb this AM. If he continues to downtrend may need to repeat an EGD but if he is rebleeding we should be seeing some output of blood per rectum, and his downtrending BUN is reassuring.  Will await Hgb from this AM, asked him to hold off on drinking / eating anything further until this is back. He may need 1 unit PRBC if his Hgb has settled in the 7s, given his cardiac history. Continue IV protonix for now, will reassess him later this AM. If Hgb stable may give soft diet.   PLAN: - repeat Hgb this AM - hold diet right now in case we pursue EGD later today - hopefully that is not needed, no bloody output - continue IV protonix  drip - Plavix has been stopped indefinitely per cardiology - given multiple duodenal ulcers in the second portion of the duodenum, a bit unusual, would consider follow up EGD as outpatient to confirm healing, can discuss at time of discharge  Will follow, call with questions.  Harlin Rain, MD [facility]   ADDENDUM: Repeat Hgb at 8.0. He did pass a small amount of maroon stool this AM with clot, concerning for rebleeding, would think older blood would be darker at this point. Discussed options with the patient, offered relook EGD to make sure no continuing low grade blood loss. He agrees and understands risks / benefits after discussion with him. Further recommendations pending the results.  Harlin Rain, MD [facility]

## 2022-12-23 NOTE — Progress Notes (Signed)
Pt had a small to mod amount of dark maroon OP in bedside commode. Notified [facility], order to change H&H to stat.

## 2022-12-23 NOTE — Progress Notes (Signed)
Called CCM to verify telemetry due to a possible rhythm variation on monitor. No significant change per CCM at the 1236 mark.

## 2022-12-23 NOTE — Anesthesia Preprocedure Evaluation (Addendum)
Anesthesia Evaluation    Airway Mallampati: III  TM Distance: >3 FB     Dental  (+) Edentulous Upper, Edentulous Lower   Pulmonary shortness of breath, sleep apnea , COPD, former smoker, PE (02/2022)   breath sounds clear to auscultation       Cardiovascular hypertension, + CAD, + Past MI and + Cardiac Stents  + pacemaker (CHB)  Rhythm:Regular Rate:Normal  Recent TEE (for watchman) with normal BiV function, normal valves   Neuro/Psych  PSYCHIATRIC DISORDERS Anxiety Depression       GI/Hepatic PUD,GERD  ,,  Endo/Other  diabetes    Renal/GU CRFRenal disease     Musculoskeletal  (+) Arthritis ,    Abdominal   Peds  Hematology  (+) Blood dyscrasia, anemia   Anesthesia Other Findings   Reproductive/Obstetrics                             Anesthesia Physical Anesthesia Plan  ASA: 3  Anesthesia Plan: MAC   Post-op Pain Management:    Induction: Intravenous  PONV Risk Score and Plan: 1 and Ondansetron and Propofol infusion  Airway Management Planned:   Additional Equipment:   Intra-op Plan:   Post-operative Plan:   Informed Consent: I have reviewed the patients History and Physical, chart, labs and discussed the procedure including the risks, benefits and alternatives for the proposed anesthesia with the patient or authorized representative who has indicated his/her understanding and acceptance.     Dental advisory given  Plan Discussed with: CRNA  Anesthesia Plan Comments:         Anesthesia Quick Evaluation

## 2022-12-23 NOTE — Anesthesia Postprocedure Evaluation (Signed)
Anesthesia Post Note  Patient: [name] Cook  Procedure(s) Performed: ESOPHAGOGASTRODUODENOSCOPY (EGD) WITH PROPOFOL HEMOSTASIS CLIP PLACEMENT     Patient location during evaluation: PACU Anesthesia Type: MAC Level of consciousness: awake and alert Pain management: pain level controlled Vital Signs Assessment: post-procedure vital signs reviewed and stable Respiratory status: spontaneous breathing, nonlabored ventilation, respiratory function stable and patient connected to nasal cannula oxygen Cardiovascular status: blood pressure returned to baseline and stable Postop Assessment: no apparent nausea or vomiting Anesthetic complications: no   No notable events documented.  Last Vitals:  Vitals:   12/23/22 1555 12/23/22 1600  BP: (!) 106/57 (!) 106/57  Pulse: 70 70  Resp: 20 17  Temp: 36.7 C   SpO2: 95% 95%    Last Pain:  Vitals:   12/23/22 1555  TempSrc: Axillary  PainSc:                  Mariann Barter

## 2022-12-23 NOTE — Transfer of Care (Signed)
Immediate Anesthesia Transfer of Care Note  Patient: [name] Cook  Procedure(s) Performed: ESOPHAGOGASTRODUODENOSCOPY (EGD) WITH PROPOFOL HEMOSTASIS CLIP PLACEMENT  Patient Location: PACU  Anesthesia Type:MAC  Level of Consciousness: sedated  Airway & Oxygen Therapy: Patient Spontanous Breathing and Patient connected to nasal cannula oxygen  Post-op Assessment: Report given to RN and Post -op Vital signs reviewed and stable  Post vital signs: Reviewed and stable  Last Vitals:  Vitals Value Taken Time  BP 98/58 12/23/22 1341  Temp    Pulse 70 12/23/22 1341  Resp 17 12/23/22 1341  SpO2 100 % 12/23/22 1341    Last Pain:  Vitals:   12/23/22 1300  TempSrc:   PainSc: 0-No pain         Complications: No notable events documented.

## 2022-12-24 DIAGNOSIS — Z7902 Long term (current) use of antithrombotics/antiplatelets: Secondary | ICD-10-CM | POA: Diagnosis not present

## 2022-12-24 DIAGNOSIS — K269 Duodenal ulcer, unspecified as acute or chronic, without hemorrhage or perforation: Secondary | ICD-10-CM | POA: Diagnosis not present

## 2022-12-24 DIAGNOSIS — D62 Acute posthemorrhagic anemia: Secondary | ICD-10-CM | POA: Diagnosis not present

## 2022-12-24 DIAGNOSIS — K922 Gastrointestinal hemorrhage, unspecified: Secondary | ICD-10-CM | POA: Diagnosis not present

## 2022-12-24 LAB — BASIC METABOLIC PANEL
Anion gap: 7 (ref 5–15)
BUN: 19 mg/dL (ref 8–23)
CO2: 23 mmol/L (ref 22–32)
Calcium: 8.2 mg/dL — ABNORMAL LOW (ref 8.9–10.3)
Chloride: 108 mmol/L (ref 98–111)
Creatinine, Ser: 1.11 mg/dL (ref 0.61–1.24)
GFR, Estimated: >60 mL/min (ref >60)
Glucose, Bld: 129 mg/dL — ABNORMAL HIGH (ref 70–99)
Potassium: 3.9 mmol/L (ref 3.5–5.1)
Sodium: 138 mmol/L (ref 135–145)

## 2022-12-24 LAB — CBC WITH DIFFERENTIAL/PLATELET
Abs Immature Granulocytes: 0.02 10*3/uL (ref 0.00–0.07)
Basophils Absolute: 0 10*3/uL (ref 0.0–0.1)
Basophils Relative: 1 %
Eosinophils Absolute: 0.1 10*3/uL (ref 0.0–0.5)
Eosinophils Relative: 1 %
HCT: 23 % — ABNORMAL LOW (ref 39.0–52.0)
Hemoglobin: 7.5 g/dL — ABNORMAL LOW (ref 13.0–17.0)
Immature Granulocytes: 1 %
Lymphocytes Relative: 20 %
Lymphs Abs: 0.9 10*3/uL (ref 0.7–4.0)
MCH: 31.3 pg (ref 26.0–34.0)
MCHC: 32.6 g/dL (ref 30.0–36.0)
MCV: 95.8 fL (ref 80.0–100.0)
Monocytes Absolute: 0.5 10*3/uL (ref 0.1–1.0)
Monocytes Relative: 12 %
Neutro Abs: 2.9 10*3/uL (ref 1.7–7.7)
Neutrophils Relative %: 65 %
Platelets: 90 10*3/uL — ABNORMAL LOW (ref 150–400)
RBC: 2.4 MIL/uL — ABNORMAL LOW (ref 4.22–5.81)
RDW: 15.9 % — ABNORMAL HIGH (ref 11.5–15.5)
WBC: 4.4 10*3/uL (ref 4.0–10.5)
nRBC: 0 % (ref 0.0–0.2)

## 2022-12-24 LAB — BPAM RBC
Blood Product Expiration Date: 202409302359
Blood Product Expiration Date: 202409302359
ISSUE DATE / TIME: 202409050933
Unit Type and Rh: 5100
Unit Type and Rh: 5100

## 2022-12-24 LAB — TYPE AND SCREEN
ABO/RH(D): O POS
Antibody Screen: NEGATIVE
Unit division: 0
Unit division: 0

## 2022-12-24 LAB — GLUCOSE, CAPILLARY
Glucose-Capillary: 126 mg/dL — ABNORMAL HIGH (ref 70–99)
Glucose-Capillary: 243 mg/dL — ABNORMAL HIGH (ref 70–99)
Glucose-Capillary: 161 mg/dL — ABNORMAL HIGH (ref 70–99)
Glucose-Capillary: 161 mg/dL — ABNORMAL HIGH (ref 70–99)
Glucose-Capillary: 181 mg/dL — ABNORMAL HIGH (ref 70–99)

## 2022-12-24 LAB — PREPARE RBC (CROSSMATCH)

## 2022-12-24 LAB — HEMOGLOBIN AND HEMATOCRIT, BLOOD
HCT: 25.4 % — ABNORMAL LOW (ref 39.0–52.0)
Hemoglobin: 8.3 g/dL — ABNORMAL LOW (ref 13.0–17.0)

## 2022-12-24 LAB — MAGNESIUM: Magnesium: 2 mg/dL (ref 1.7–2.4)

## 2022-12-24 MED ORDER — SODIUM CHLORIDE 0.9% IV SOLUTION: Freq: Once | INTRAVENOUS | Status: AC

## 2022-12-24 MED ORDER — MUPIROCIN 2 % EX OINT
1.0000 | TOPICAL_OINTMENT | Freq: Two times a day (BID) | CUTANEOUS | Status: DC
  Administered 2022-12-24 – 2022-12-25 (×4): 1 via NASAL
  Filled 2022-12-24: qty 22

## 2022-12-24 MED ORDER — CHLORHEXIDINE GLUCONATE CLOTH 2 % EX PADS
6.0000 | MEDICATED_PAD | Freq: Every day | CUTANEOUS | Status: DC
  Administered 2022-12-24 – 2022-12-25 (×2): 6 via TOPICAL

## 2022-12-24 NOTE — Progress Notes (Signed)
PROGRESS NOTE    [name] Cook  OZD:[id number] DOB: 04/27/1950 DOA: 12/20/2022 PCP: [name], NP   Brief Narrative:  72 y.o. male with medical history significant of  CHB s/p PPM, CAD s/p previous PCI to RCA in 2016, DM2, HLD, HTN, OSA, tobacco use, history of PE (02/2022) status post pulmonary 6 months of treatment; atrial flutter/atrial fibrillation previously on full dose oral anticoagulation complicated by history of GI bleeding with subsequent Watchman procedure on 06/22/2022 presented with coffee-ground emesis and abdominal pain.  On presentation, hemoglobin was 12.7.  CTA of abdomen showed no CT evidence of acute gastrointestinal hemorrhage.  He was started on Protonix drip.  GI was consulted.  He underwent EGD on 12/20/2022.  Hemoglobin drifted downwards.  He underwent EGD again on 12/23/2022.  Assessment & Plan:   Possible upper GI bleeding Acute blood loss anemia -Has a history of prior GI bleed on oral anticoagulation -Was recently on Plavix and cardiology had stopped Plavix on 12/20/2022 and recommended to start aspirin 81 mg daily. -Hemoglobin 12.7 on presentation.  Received 1 unit of packed red cell transfusion on 12/21/2022 because of active bleeding and hypotension.  Hemoglobin 7.5 this morning.  Monitor H&H. -Continue IV Protonix.   -GI following: Underwent EGD on 12/20/2022: Showed actively bleeding duodenal ulcer treated with epinephrine injection, anticoagulation along with few other smaller clean-based superficial ulcers in the second portion of the duodenum along with LA grade a reflux esophagitis.   -Underwent EGD again on 12/23/2022: Follow further GI recommendations.  Currently on full liquid diet.  Transfuse 1 unit packed red cells as per GI recommendations.  Paroxysmal atrial fibrillation/flutter -Status post LAAO with Watchman on 06/22/2022.  Recent cardiology plan as above.  Hold off on aspirin till clearance from GI. -Currently rate  controlled  Hyperkalemia -Improved.  AKI Acute metabolic acidosis -Possibly from prerenal cause from GI bleeding and hypotension.  -Creatinine improved.  Bicarb has improved to 23 today.  Monitor.  CAD status post MI status post PCI Hypertension -History of PCI to RCA in 2016 -Currently chest pain-free.  Blood pressure currently stable: Imdur on hold.  Outpatient follow-up with cardiology  History of complete heart block status post pacemaker -Outpatient follow-up cardiology/EP  Thrombocytopenia -Questionable cause.  Monitor.  Diabetes mellitus type 2 -Continue CBGs with SSI.  Hyperlipidemia -Resume statin once able to tolerate oral  OSA -Continue CPAP at bedtime  History of PE in 02/2022 -Status post 6 months of treatment  DVT prophylaxis: SCDs Code Status: Full Family Communication: None at bedside Disposition Plan: Status is: Inpatient Remains inpatient appropriate because: Of severity of illness    Consultants: GI  Procedures:  EGD on 12/21/2022 Impression:               - Small hiatal hernia.                           - LA Grade A reflux esophagitis                           - Normal esophagus otherwise.                           - Red blood and clot in the gastric fundus, in the  gastric antrum and in the pylorus. Lavaged without                            any active bleeding noted (small area of cardia /                            fundus not seen due to residual clot but not                            entirely cleared given active bleeding treated in                            the duodenum)                           - Blood in the duodenal bulb and in the second                            portion of the duodenum.                           - Actively bleeding duodenal ulcer (Forrest Class                            Ia) in the second portion of the duodenum treated                            with epinephrine injection, coagulation  using                            Goldprobe, and then Purastat. Hemostasis achieved.                           - A few other smaller clean based superficial                            ulcers in the second portion of the duodenum.                           - Biopsies not taken given recent severe bleeding                            and recent Plavix use. Recommendation:           - Return patient to hospital ward for ongoing care.                           - Okay to extubate                           - NPO today given recent endoscopic treatment and                            risk for rebleeding.                           -  Continue present medications.                           - Continue IV protonix drip for now                           - Monitor Hgb, response to transfusion                           - Avoid all NSAIDs                           - Continue to hold Plavix (patient states just                            yesterday his cardiologist stopped it and no plans                            to resume it)                           - GI service will follow, call with questions or                            recurrent bleeding in the interim.  EGD on 12/23/2022 Impression:               - Small hiatal hernia.                           - LA Grade A esophagitis.                           - Normal esophagus otherwise.                           - Normal stomach.                           - Cratered duodenal ulcer with flat red spot at the                            base - site of treatment from prior therapy. I did                            not see any heme anywhere or stigmata of recent                            bleeding - unclear if he truly had rebleeding but 3                            clips were placed at the site to try and close the                            base of the ulcer and prevent rebleeding.                           -  2 other smaller clean based ulcers in the second                             portion of the duodenum.                           Overall, unclear if he truly had rebleeding or not                            (BUN has continued to downtrend - unclear if he                            passed old blood this morning), but with Hgb drift                            I elected to treat the site to reduce risk for                            rebleeding. Recommendation:           - Return patient to hospital ward for ongoing care.                           - Full liquid diet.                           - Continue present medications.                           - Continue IV protonix drip                           - Continue to trend Hgb, transfuse for PRBC lower                            than 8                           - We will reassess him tomorrow, call with                            questions.  Antimicrobials: None   Subjective: Patient seen and examined at bedside.  Denies worsening shortness breath, abdominal pain, fever or vomiting. objective: Vitals:   12/23/22 2306 12/24/22 0309 12/24/22 0500 12/24/22 0737  BP: 103/82 132/62  (!) 122/54  Pulse: 77 70 70 71  Resp: 20 16  20   Temp: 98.6 F (37 C) 98.4 F (36.9 C)  97.6 F (36.4 C)  TempSrc: Oral Oral  Oral  SpO2: 97% 97% 93% 100%  Weight:   87 kg   Height:        Intake/Output Summary (Last 24 hours) at 12/24/2022 0758 Last data filed at 12/24/2022 0739 Gross per 24 hour  Intake 1213.96 ml  Output 2675 ml  Net -1461.04 ml   Filed Weights   12/22/22 0324 12/23/22 0351 12/24/22 0500  Weight: 89.6 kg 88 kg 87 kg    Examination:  General: On room air currently.  No distress.   ENT/neck: No JVD elevation or palpable thyromegaly noted respiratory: Decreased breath sounds at bases bilaterally with some crackles  CVS: S1-S2 heard; currently rate controlled abdominal: Soft, nontender, slightly distended; no organomegaly, normal bowel sounds heard  extremities: Trace lower extremity  edema present; no cyanosis  CNS awake; still slow to respond.  No obvious focal deficits noted  lymph: No cervical lymphadenopathy Skin: No obvious ecchymosis/lesions psych: Currently not agitated.  Affect is mostly flat.   Musculoskeletal: No obvious joint deformity/tenderness   Data Reviewed: I have personally reviewed following labs and imaging studies  CBC: Recent Labs  Lab 12/20/22 2116 12/21/22 0218 12/21/22 1115 12/21/22 1835 12/22/22 0206 12/22/22 0825 12/23/22 0227 12/23/22 1003 12/24/22 0213  WBC 7.6 4.8  --   --  7.0  --  5.5  --  4.4  NEUTROABS 6.1  --   --   --  5.7  --   --   --  2.9  HGB 12.7* 11.3*   < > 9.4* 9.0* 8.8* 7.4* 8.0* 7.5*  HCT 39.0 34.5*   < > 28.6* 27.4* 26.7* 22.7*  --  23.0*  MCV 96.3 95.3  --   --  96.5  --  95.4  --  95.8  PLT 115* 104*  --   --  112*  --  90*  --  90*   < > = values in this interval not displayed.   Basic Metabolic Panel: Recent Labs  Lab 12/20/22 2116 12/21/22 0218 12/22/22 0206 12/23/22 0227 12/24/22 0213  NA 137 138 137 133* 138  K 4.5 4.8 5.2* 3.7 3.9  CL 106 110 109 106 108  CO2 20* 23 19* 19* 23  GLUCOSE 144* 97 163* 129* 129*  BUN 34* 34* 51* 27* 19  CREATININE 1.18 1.15 1.45* 1.09 1.11  CALCIUM 8.6* 8.0* 8.1* 7.6* 8.2*  MG  --   --  2.1  --  2.0   GFR: Estimated Creatinine Clearance: 66 mL/min (by C-G formula based on SCr of 1.11 mg/dL). Liver Function Tests: Recent Labs  Lab 12/20/22 2116 12/21/22 0218  AST 18 16  ALT 19 18  ALKPHOS 40 39  BILITOT 0.4 0.8  PROT 5.3* 4.9*  ALBUMIN 3.3* 3.1*   Recent Labs  Lab 12/20/22 2116  LIPASE 35   No results for input(s): "AMMONIA" in the last 168 hours. Coagulation Profile: No results for input(s): "INR", "PROTIME" in the last 168 hours. Cardiac Enzymes: No results for input(s): "CKTOTAL", "CKMB", "CKMBINDEX", "TROPONINI" in the last 168 hours. BNP (last 3 results) No results for input(s): "PROBNP" in the last 8760 hours. HbA1C: No results for  input(s): "HGBA1C" in the last 72 hours. CBG: Recent Labs  Lab 12/23/22 1150 12/23/22 1644 12/23/22 2017 12/23/22 2312 12/24/22 0307  GLUCAP 87 254* 87 108* 126*   Lipid Profile: No results for input(s): "CHOL", "HDL", "LDLCALC", "TRIG", "CHOLHDL", "LDLDIRECT" in the last 72 hours. Thyroid Function Tests: No results for input(s): "TSH", "T4TOTAL", "FREET4", "T3FREE", "THYROIDAB" in the last 72 hours. Anemia Panel: No results for input(s): "VITAMINB12", "FOLATE", "FERRITIN", "TIBC", "IRON", "RETICCTPCT" in the last 72 hours. Sepsis Labs: No results for input(s): "PROCALCITON", "LATICACIDVEN" in the last 168 hours.  Recent Results (from the past 240 hour(s))  MRSA Next Gen by PCR, Nasal     Status: Abnormal   Collection Time: 12/21/22 12:55 AM   Specimen: Nasal Mucosa; Nasal Swab  Result Value Ref Range Status  MRSA by PCR Next Gen DETECTED (A) NOT DETECTED Final    Comment: RESULTS CALLED TO, READ BACK BY AND VERIFIED WITH RN A.COOPER ON 12/21/22 AT 0301 BY NM (NOTE) The GeneXpert MRSA Assay (FDA approved for NASAL specimens only), is one component of a comprehensive MRSA colonization surveillance program. It is not intended to diagnose MRSA infection nor to guide or monitor treatment for MRSA infections. Test performance is not FDA approved in patients less than 23 years old. Performed at [facility], [address], [place], [place] [address]          Radiology Studies: No results found.      Scheduled Meds:  sodium chloride   Intravenous Once   Chlorhexidine Gluconate Cloth  6 each Topical Q0600   influenza vaccine adjuvanted  0.5 mL Intramuscular Tomorrow-1000   insulin aspart  0-9 Units Subcutaneous Q4H   isosorbide mononitrate  60 mg Oral Daily   mupirocin ointment  1 Application Nasal BID   pantoprazole  40 mg Intravenous Q12H   Continuous Infusions:  lactated ringers 0 mL (12/22/22 0600)          Glade Lloyd, MD [facility] 12/24/2022, 7:58 AM

## 2022-12-24 NOTE — Evaluation (Signed)
Physical Therapy Brief Evaluation and Discharge Note Patient Details Name: [name] Cook MRN: [id number] DOB: June 17, 1950 Today's Date: 12/24/2022   History of Present Illness  Pt is 72 year old presented to [facility] on  12/20/22 for GI bleed with acute blood loss anemia.   PMH - Pacer, CAD, PCI to RCA, DM, HTN, OSA, PE, afib, GI bleed  Clinical Impression  Pt doing well with mobility and no further PT needed.  Will refer to mobility team for continued ambulation until dc home.         PT Assessment Patient does not need any further PT services  Assistance Needed at Discharge  PRN    Equipment Recommendations None recommended by PT  Recommendations for Other Services       Precautions/Restrictions Precautions Precautions: None Restrictions Weight Bearing Restrictions: No        Mobility  Bed Mobility Rolling: Independent Supine/Sidelying to sit: Independent Sit to supine/sidelying: Independent    Transfers Overall transfer level: Needs assistance Equipment used: Rolling walker (2 wheels) Transfers: Sit to/from Stand Sit to Stand: Supervision                Ambulation/Gait Ambulation/Gait assistance: Supervision Gait Distance (Feet): 150 Feet Assistive device: Rolling walker (2 wheels) Gait Pattern/deviations: Step-through pattern, Decreased stride length Gait Speed: Below normal General Gait Details: supervision for safety  Home Activity Instructions    Stairs            Modified Rankin (Stroke Patients Only)        Balance Overall balance assessment: Mild deficits observed, not formally tested                        Pertinent Vitals/Pain PT - Brief Vital Signs All Vital Signs Stable: Yes Pain Assessment Pain Assessment: No/denies pain     Home Living Family/patient expects to be discharged to:: Private residence Living Arrangements: Spouse/significant other;Other relatives Available Help at Discharge: Family Home Environment:  Stairs to enter;Rail - right;Rail - left   Home Equipment: Shower seat - built [employer] (2 wheels)        Prior Function Level of Independence: Independent      UE/LE Assessment   UE ROM/Strength/Tone/Coordination: WFL    LE ROM/Strength/Tone/Coordination: Generalized weakness      Communication   Communication Communication: No apparent difficulties     Cognition Overall Cognitive Status: Appears within functional limits for tasks assessed/performed       General Comments      Exercises     Assessment/Plan    PT Problem List         PT Visit Diagnosis Muscle weakness (generalized) (M62.81)    No Skilled PT Patient is supervision for all activity/mobility   Co-evaluation                AMPAC 6 Clicks Help needed turning from your back to your side while in a flat bed without using bedrails?: None Help needed moving from lying on your back to sitting on the side of a flat bed without using bedrails?: None Help needed moving to and from a bed to a chair (including a wheelchair)?: A Little Help needed standing up from a chair using your arms (e.g., wheelchair or bedside chair)?: A Little Help needed to walk in hospital room?: A Little Help needed climbing 3-5 steps with a railing? : A Little 6 Click Score: 20      End of Session  Activity Tolerance: Patient tolerated treatment well Patient left: in bed;with call bell/phone within reach Nurse Communication: Mobility status PT Visit Diagnosis: Muscle weakness (generalized) (M62.81)     Time: 4098-1191 PT Time Calculation (min) (ACUTE ONLY): 18 min  Charges:   PT Evaluation $PT Eval Low Complexity: 1 Low      [facility] PT Acute Rehabilitation Services Office [phone]   Angelina Ok [facility]  12/24/2022, 10:45 AM

## 2022-12-24 NOTE — Progress Notes (Addendum)
Mobility Specialist Progress Note:   12/24/22 1523  Mobility  Activity Ambulated with assistance in hallway  Level of Assistance Contact guard assist, steadying assist  Assistive Device Front wheel walker  Distance Ambulated (ft) 225 ft  Activity Response Tolerated well  Mobility Referral Yes  $Mobility charge 1 Mobility  Mobility Specialist Start Time (ACUTE ONLY) 1445  Mobility Specialist Stop Time (ACUTE ONLY) 1455  Mobility Specialist Time Calculation (min) (ACUTE ONLY) 10 min   Pre Mobility: 70 HR  Post Mobility: 73 HR , 100% SpO2  Pt received in bed, agreeable to mobility. Denied any feelings of discomfort during ambulation, asymptomatic throughout. Pt left in chair with call bell in reach and all needs met.   [name] Cook  Mobility Specialist Please contact via [employer] office at [phone]

## 2022-12-24 NOTE — Progress Notes (Signed)
Progress Note   Subjective  Patient doing well. Tolerating full liquids. No further bleeding symptoms that he endorses overnight. No pain. Hgb mid 7s. BUN continues to downtrend.   Objective   Vital signs in last 24 hours: Temp:  [96.7 F (35.9 C)-98.6 F (37 C)] 97.6 F (36.4 C) (09/08 0737) Pulse Rate:  [70-77] 71 (09/08 0737) Resp:  [14-20] 20 (09/08 0737) BP: (98-132)/(54-82) 122/54 (09/08 0737) SpO2:  [93 %-100 %] 100 % (09/08 0737) Weight:  [87 kg] 87 kg (09/08 0500) Last BM Date : 12/20/22 General:    white male in NAD Neurologic:  Alert and oriented,  grossly normal neurologically. Psych:  Cooperative. Normal mood and affect.  Intake/Output from previous day: 09/07 0701 - 09/08 0700 In: 974 [P.O.:480; I.V.:494] Out: 3075 [Urine:2375; Stool:700] Intake/Output this shift: Total I/O In: 240 [P.O.:240] Out: -   Lab Results: Recent Labs    12/22/22 0206 12/22/22 0825 12/23/22 0227 12/23/22 1003 12/24/22 0213  WBC 7.0  --  5.5  --  4.4  HGB 9.0* 8.8* 7.4* 8.0* 7.5*  HCT 27.4* 26.7* 22.7*  --  23.0*  PLT 112*  --  90*  --  90*   BMET Recent Labs    12/22/22 0206 12/23/22 0227 12/24/22 0213  NA 137 133* 138  K 5.2* 3.7 3.9  CL 109 106 108  CO2 19* 19* 23  GLUCOSE 163* 129* 129*  BUN 51* 27* 19  CREATININE 1.45* 1.09 1.11  CALCIUM 8.1* 7.6* 8.2*   LFT No results for input(s): "PROT", "ALBUMIN", "AST", "ALT", "ALKPHOS", "BILITOT", "BILIDIR", "IBILI" in the last 72 hours. PT/INR No results for input(s): "LABPROT", "INR" in the last 72 hours.  Studies/Results: No results found.     Assessment / Plan:    72 y/o male here with the following:  Upper GI bleed secondary to duodenal ulcer Post hemorrhagic anemia Antiplatelet use   Large actively bleeding duodenal ulcer on EGD 9/5, significant amount of blood in the lumen of the small bowel in this exam with heavy clot burden.  Was able to achieve endoscopic hemostasis with cauterization of  visible bleeding vessel and then prophylactically placed Purastat into the ulcer bed to help rebleeding. There was no bleeding at the end of the procedure and the site looked good.   He had some downtrending Hgb in recent days and passed some further blood per rectum yesterday - hard to say if that was old or recent bleeding, we did a relook EGD yesterday. Ulcer site seemed to be healing and looked better but did have a flat red spot at the site of recent coagulation therapy. There was no heme anywhere in the bowel - again unclear if he truly rebled or not, but 3 hemostasis clips were placed to try and close the ulcer bed and prevent rebleeding. He has since done well.  Hgb has drifted down to 7s but BUN has continued to downtrend, I do not think he has any active bleeding at this time. I would recommend 1 unit PRBC transfusion today in light of his cardiac history and to give him some reserve if he does rebleed. He is agreeable to this. Otherwise will give him soft diet as well.  Would monitor today on IV Protonix and if he does well can go home on oral protonix 40mg  twice daily. He has stopped Plavix indefinitely per his cardiologist, so will not plan on resuming that upon discharge. I think okay to resume a  baby aspirin in a few days.   Following discharge he will need follow up with his primary GI Dr. Tobi Bastos. Would consider a repeat EGD in a few months to confirm mucosal healing (given location of these ulcerations, more distal duodenum). He also needs close monitoring of Hgb as outpatient.  PLAN: - 1 unit PRBC transfusion today, monitor response - advance to soft diet - monitor for recurrent bleeding - suspect he still may pass some old blood - continue IV protonix 40mg  BID - upon discharge should be on 40mg  PO BID - stopping Plavix indefinitely per his cardiologist - okay to resume baby aspirin in a few days - consider discharge tomorrow if he does well today - will need close monitoring of Hgb  post discharge and follow up coordinated with his primary GI Dr. Tobi Bastos - I explained this with him and he agrees.  Call with questions, we will otherwise sign off for now.  Harlin Rain, MD [facility]

## 2022-12-25 ENCOUNTER — Encounter (HOSPITAL_COMMUNITY): Payer: Self-pay | Admitting: Gastroenterology

## 2022-12-25 DIAGNOSIS — K922 Gastrointestinal hemorrhage, unspecified: Secondary | ICD-10-CM | POA: Diagnosis not present

## 2022-12-25 LAB — BPAM RBC
Blood Product Expiration Date: 202409262359
ISSUE DATE / TIME: 202409081104
Unit Type and Rh: 5100

## 2022-12-25 LAB — GLUCOSE, CAPILLARY
Glucose-Capillary: 145 mg/dL — ABNORMAL HIGH (ref 70–99)
Glucose-Capillary: 156 mg/dL — ABNORMAL HIGH (ref 70–99)
Glucose-Capillary: 157 mg/dL — ABNORMAL HIGH (ref 70–99)
Glucose-Capillary: 172 mg/dL — ABNORMAL HIGH (ref 70–99)

## 2022-12-25 LAB — CBC WITH DIFFERENTIAL/PLATELET
Abs Immature Granulocytes: 0.02 10*3/uL (ref 0.00–0.07)
Basophils Absolute: 0 10*3/uL (ref 0.0–0.1)
Basophils Relative: 0 %
Eosinophils Absolute: 0.1 10*3/uL (ref 0.0–0.5)
Eosinophils Relative: 3 %
HCT: 27.1 % — ABNORMAL LOW (ref 39.0–52.0)
Hemoglobin: 9 g/dL — ABNORMAL LOW (ref 13.0–17.0)
Immature Granulocytes: 0 %
Lymphocytes Relative: 23 %
Lymphs Abs: 1.1 10*3/uL (ref 0.7–4.0)
MCH: 31.9 pg (ref 26.0–34.0)
MCHC: 33.2 g/dL (ref 30.0–36.0)
MCV: 96.1 fL (ref 80.0–100.0)
Monocytes Absolute: 0.6 10*3/uL (ref 0.1–1.0)
Monocytes Relative: 12 %
Neutro Abs: 2.9 10*3/uL (ref 1.7–7.7)
Neutrophils Relative %: 62 %
Platelets: 100 10*3/uL — ABNORMAL LOW (ref 150–400)
RBC: 2.82 MIL/uL — ABNORMAL LOW (ref 4.22–5.81)
RDW: 15.9 % — ABNORMAL HIGH (ref 11.5–15.5)
WBC: 4.7 10*3/uL (ref 4.0–10.5)
nRBC: 0.4 % — ABNORMAL HIGH (ref 0.0–0.2)

## 2022-12-25 LAB — TYPE AND SCREEN
ABO/RH(D): O POS
Antibody Screen: NEGATIVE
Unit division: 0

## 2022-12-25 LAB — BASIC METABOLIC PANEL
Anion gap: 7 (ref 5–15)
BUN: 17 mg/dL (ref 8–23)
CO2: 26 mmol/L (ref 22–32)
Calcium: 8.5 mg/dL — ABNORMAL LOW (ref 8.9–10.3)
Chloride: 105 mmol/L (ref 98–111)
Creatinine, Ser: 1.08 mg/dL (ref 0.61–1.24)
GFR, Estimated: >60 mL/min (ref >60)
Glucose, Bld: 153 mg/dL — ABNORMAL HIGH (ref 70–99)
Potassium: 3.8 mmol/L (ref 3.5–5.1)
Sodium: 138 mmol/L (ref 135–145)

## 2022-12-25 LAB — MAGNESIUM: Magnesium: 2 mg/dL (ref 1.7–2.4)

## 2022-12-25 MED ORDER — PANTOPRAZOLE SODIUM 40 MG PO TBEC: 40.0000 mg | DELAYED_RELEASE_TABLET | Freq: Two times a day (BID) | ORAL | 0 refills | Status: DC

## 2022-12-25 MED ORDER — ASPIRIN 81 MG PO TBEC: 81.0000 mg | DELAYED_RELEASE_TABLET | Freq: Every day | ORAL | Status: AC

## 2022-12-25 NOTE — Discharge Summary (Signed)
Physician Discharge Summary  [name] Cook:[id number] DOB: [birth year] DOA: 12/20/2022  PCP: [name], NP  Admit date: 12/20/2022 Discharge date: 12/25/2022  Admitted From: Home Disposition: Home  Recommendations for Outpatient Follow-up:  Follow up with PCP in 1 week with repeat CBC/BMP Outpatient follow-up with GI Follow up in ED if symptoms worsen or new appear   Home Health: No Equipment/Devices: None  Discharge Condition: Stable CODE STATUS: Full Diet recommendation: Heart healthy/carb modified  Brief/Interim Summary: 72 y.o. male with medical history significant of  CHB s/p PPM, CAD s/p previous PCI to RCA in 2016, DM2, HLD, HTN, OSA, tobacco use, history of PE (02/2022) status post pulmonary 6 months of treatment; atrial flutter/atrial fibrillation previously on full dose oral anticoagulation complicated by history of GI bleeding with subsequent Watchman procedure on 06/22/2022 presented with coffee-ground emesis and abdominal pain.  On presentation, hemoglobin was 12.7.  CTA of abdomen showed no CT evidence of acute gastrointestinal hemorrhage.  He was started on Protonix drip.  GI was consulted.  He underwent EGD on 12/20/2022.  Hemoglobin drifted downwards.  He underwent EGD again on 12/23/2022.  He required 2 units of packed red cells transfusion during this hospitalization, last 1 being on 12/23/2022.  Subsequently, hemoglobin has remained stable.  He is tolerating diet.  GI has cleared the patient for discharge.  Discharge patient home today on oral Protonix.  Outpatient follow-up with PCP and GI.  Discharge Diagnoses:   Possible upper GI bleeding Acute blood loss anemia -Has a history of prior GI bleed on oral anticoagulation -Was recently on Plavix and cardiology had stopped Plavix on 12/20/2022 and recommended to start aspirin 81 mg daily. -Hemoglobin 12.7 on presentation.  Received 1 unit of packed red cell transfusion on 12/21/2022 because of active bleeding and  hypotension.  Hemoglobin 7.5 on 12/23/2022.  Received 1 more unit of packed red cell transfusion.  Hemoglobin 9 this morning. -Treated with IV Protonix.   -GI following: Underwent EGD on 12/20/2022: Showed actively bleeding duodenal ulcer treated with epinephrine injection, anticoagulation along with few other smaller clean-based superficial ulcers in the second portion of the duodenum along with LA grade a reflux esophagitis.   -Underwent EGD again on 12/23/2022: Follow further GI recommendations.   -Currently tolerating soft diet.  He is hemodynamically stable and feels okay to go home today.  GI has cleared him for discharge home on oral Protonix twice a day.   -Discharge patient home today on oral Protonix.  Outpatient follow-up with PCP and GI.  Paroxysmal atrial fibrillation/flutter -Status post LAAO with Watchman on 06/22/2022.  Recent cardiology plan as above.  Resume aspirin in the next few days as per GI recommendations current start from 12/27/2022. -Currently rate controlled.  Outpatient follow-up with cardiology   Hyperkalemia -Improved.   AKI Acute metabolic acidosis -Possibly from prerenal cause from GI bleeding and hypotension.  -Creatinine improved.  Bicarb has improved.  Outpatient follow-up.  Lisinopril to remain on hold till reevaluation by PCP.   CAD status post MI status post PCI Hypertension -History of PCI to RCA in 2016 -Currently chest pain-free.  Blood pressure currently stable.  Continue Imdur.  Lisinopril plan as above.  Outpatient follow-up with cardiology   History of complete heart block status post pacemaker -Outpatient follow-up cardiology/EP   Thrombocytopenia -Questionable cause.  Monitor intermittently as an outpatient.   Diabetes mellitus type 2 -Continue carb modified diet.  Resume home regimen.    Hyperlipidemia -Continue statin   OSA -Continue CPAP  at bedtime   History of PE in 02/2022 -Status post 6 months of treatment   Discharge  Instructions   Allergies as of 12/25/2022       Reactions   Januvia [sitagliptin] Other (See Comments)   Elevated lipase   Metformin And Related Diarrhea        Medication List     STOP taking these medications    lisinopril 2.5 MG tablet Commonly known as: ZESTRIL       TAKE these medications    Accu-Chek Guide test strip Generic drug: glucose blood TEST FOUR TIMES A DAY   albuterol 108 (90 Base) MCG/ACT inhaler Commonly known as: VENTOLIN HFA INHALE 2 PUFFS BY MOUTH EVERY 4 HOURS AS NEEDED FOR WHEEZE OR FOR SHORTNESS OF BREATH   aspirin EC 81 MG tablet Take 1 tablet (81 mg total) by mouth daily. Swallow whole. Start taking on: December 27, 2022 What changed: These instructions start on December 27, 2022. If you are unsure what to do until then, ask your doctor or other care provider.   blood glucose meter kit and supplies Kit Dispense based on patient and insurance preference. Use up to four times daily as directed. (FOR ICD-9 250.00, 250.01).   fluticasone 50 MCG/ACT nasal spray Commonly known as: FLONASE PLACE 1 SPRAY INTO BOTH NOSTRILS DAILY AS NEEDED FOR ALLERGIES OR RHINITIS.   gabapentin 300 MG capsule Commonly known as: NEURONTIN TAKE 1 CAPSULE BY MOUTH EVERY MORNING AND 2 CAPSULES BY MOUTH AT NIGHT FOR NEUROPATHY   glipiZIDE 10 MG 24 hr tablet Commonly known as: GLUCOTROL XL TAKE 1 TABLET (10 MG TOTAL) BY MOUTH DAILY WITH BREAKFAST. FOR DIABETES.   isosorbide mononitrate 60 MG 24 hr tablet Commonly known as: IMDUR Take 1 tablet (60 mg total) by mouth daily.   Jardiance 25 MG Tabs tablet Generic drug: empagliflozin TAKE 1 TABLET (25 MG TOTAL) BY MOUTH DAILY BEFORE BREAKFAST. FOR DIABETES.   nitroGLYCERIN 0.4 MG SL tablet Commonly known as: NITROSTAT Place 1 tablet (0.4 mg total) under the tongue every 5 (five) minutes as needed for chest pain. Do not exceed 3 tablets in 24 hours.   pantoprazole 40 MG tablet Commonly known as: PROTONIX Take  1 tablet (40 mg total) by mouth 2 (two) times daily before a meal. What changed: when to take this   rosuvastatin 20 MG tablet Commonly known as: CRESTOR TAKE 1 TABLET BY MOUTH EVERY DAY IN THE EVENING FOR CHOLESTEROL   sertraline 100 MG tablet Commonly known as: ZOLOFT TAKE 1 TABLET (100 MG TOTAL) BY MOUTH DAILY. FOR ANXIETY AND DEPRESSION.   Symbicort 160-4.5 MCG/ACT inhaler Generic drug: budesonide-formoterol INHALE 2 PUFFS INTO THE LUNGS TWICE A DAY What changed: See the new instructions.   tamsulosin 0.4 MG Caps capsule Commonly known as: FLOMAX TAKE 1 CAPSULE (0.4 MG TOTAL) BY MOUTH 2 (TWO) TIMES DAILY.   TYLENOL 500 MG tablet Generic drug: acetaminophen Take 1,000 mg by mouth daily.   vitamin C 1000 MG tablet Take 2,000 mg by mouth daily.        Follow-up Information     [name], NP. Schedule an appointment as soon as possible for a visit in 1 week(s).   Specialty: Internal Medicine Why: with repeat cbc/bmp Contact information: [address] [place] [address] [phone]         Wyline Mood, MD. Schedule an appointment as soon as possible for a visit in 1 week(s).   Specialty: Gastroenterology Contact information:  [address] [place] [address] [phone]                Allergies  Allergen Reactions   Januvia [Sitagliptin] Other (See Comments)    Elevated lipase   Metformin And Related Diarrhea    Consultations: GI   Procedures/Studies: CT ANGIO GI BLEED  Result Date: 12/20/2022 CLINICAL DATA:  lower abdominal pain, hematemesis EXAM: CTA ABDOMEN AND PELVIS WITHOUT AND WITH CONTRAST TECHNIQUE: Multidetector CT imaging of the abdomen and pelvis was performed using the standard protocol during bolus administration of intravenous contrast. Multiplanar reconstructed images and MIPs were obtained and reviewed to evaluate the vascular anatomy. RADIATION DOSE REDUCTION: This exam was performed according to  the departmental dose-optimization program which includes automated exposure control, adjustment of the mA and/or kV according to patient size and/or use of iterative reconstruction technique. CONTRAST:  75mL OMNIPAQUE IOHEXOL 350 MG/ML SOLN COMPARISON:  None Available. FINDINGS: VASCULAR No extravasation of intravenous contrast within the lumen of the bowel. Aorta: Severe degenerative changes of the spine. 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 No dissection, vasculitis or significant stenosis. Celiac: Mild to moderate atherosclerotic plaque. Patent without evidence of aneurysm, dissection, vasculitis or significant stenosis. ZDG:UYQI to moderate atherosclerotic plaque. Patent without evidence of aneurysm, dissection, vasculitis or significant stenosis. Renals: Mild to moderate atherosclerotic plaque.Both renal arteries are patent without evidence of aneurysm, dissection, vasculitis, fibromuscular dysplasia or significant stenosis. IMA: Patent without evidence of aneurysm, dissection, vasculitis or significant stenosis. Inflow: Severe atherosclerotic plaque. Patent without evidence of aneurysm, dissection, vasculitis or significant stenosis. Proximal Outflow: Moderate severe atherosclerotic plaque. Bilateral common femoral and visualized portions of the superficial and profunda femoral arteries are patent without evidence of aneurysm, dissection, vasculitis or significant stenosis. Veins: The main portal, splenic, superior mesenteric veins are patent. Review of the MIP images confirms the above findings. NON-VASCULAR Lower chest: No acute abnormality. Coronary artery calcifications. Cardiac leads. Coronary artery stent. Hepatobiliary: No focal liver abnormality. Calcified gallstone noted within the gallbladder lumen. No gallbladder wall thickening or pericholecystic fluid. No biliary dilatation. Pancreas: No focal lesion. Normal pancreatic contour. No surrounding inflammatory changes. No main  pancreatic ductal dilatation. Spleen: Normal in size without focal abnormality. Adrenals/Urinary Tract: No adrenal nodule bilaterally. Bilateral kidneys enhance symmetrically. Subcentimeter hypodensity too small to characterize-no further follow-up indicated. No hydronephrosis. No hydroureter. The urinary bladder is unremarkable. Stomach/Bowel: Stomach is within normal limits. No evidence of bowel wall thickening or dilatation. Colonic diverticulosis. The appendix is not definitely identified with no inflammatory changes in the right lower quadrant to suggest acute appendicitis. Lymphatic: No lymphadenopathy. Reproductive: Prostate is unremarkable. Other: No intraperitoneal free fluid. No intraperitoneal free gas. No organized fluid collection. Musculoskeletal: No intraperitoneal free fluid. No intraperitoneal free gas. No organized fluid collection. IMPRESSION: VASCULAR 1. No CT evidence of acute gastrointestinal hemorrhage. No abdominal wall hernia or abnormality. No suspicious lytic or blastic osseous lesions. No acute displaced fracture. Multilevel degenerative changes of the spine. . This recommendation follows ACR consensus guidelines: White Paper of the ACR Incidental Findings Committee II on Vascular Findings. J Am Coll Radiol 2013; 10:789-794. NON-VASCULAR 1. Cholelithiasis with no CT finding of acute cholecystitis. 2. Colonic diverticulosis with no acute diverticulitis. Electronically Signed   By: Tish Frederickson M.D.   On: 12/20/2022 23:44    EGD on 12/21/2022 Impression:               - Small hiatal hernia.                           -  LA Grade A reflux esophagitis                           - Normal esophagus otherwise.                           - Red blood and clot in the gastric fundus, in the                            gastric antrum and in the pylorus. Lavaged without                            any active bleeding noted (small area of cardia /                            fundus not seen due to  residual clot but not                            entirely cleared given active bleeding treated in                            the duodenum)                           - Blood in the duodenal bulb and in the second                            portion of the duodenum.                           - Actively bleeding duodenal ulcer (Forrest Class                            Ia) in the second portion of the duodenum treated                            with epinephrine injection, coagulation using                            Goldprobe, and then Purastat. Hemostasis achieved.                           - A few other smaller clean based superficial                            ulcers in the second portion of the duodenum.                           - Biopsies not taken given recent severe bleeding                            and recent Plavix use. Recommendation:           - Return patient to hospital ward for ongoing care.                           -  Okay to extubate                           - NPO today given recent endoscopic treatment and                            risk for rebleeding.                           - Continue present medications.                           - Continue IV protonix drip for now                           - Monitor Hgb, response to transfusion                           - Avoid all NSAIDs                           - Continue to hold Plavix (patient states just                            yesterday his cardiologist stopped it and no plans                            to resume it)                           - GI service will follow, call with questions or                            recurrent bleeding in the interim.   EGD on 12/23/2022 Impression:               - Small hiatal hernia.                           - LA Grade A esophagitis.                           - Normal esophagus otherwise.                           - Normal stomach.                           - Cratered duodenal ulcer  with flat red spot at the                            base - site of treatment from prior therapy. I did                            not see any heme anywhere or stigmata of recent  bleeding - unclear if he truly had rebleeding but 3                            clips were placed at the site to try and close the                            base of the ulcer and prevent rebleeding.                           - 2 other smaller clean based ulcers in the second                            portion of the duodenum.                           Overall, unclear if he truly had rebleeding or not                            (BUN has continued to downtrend - unclear if he                            passed old blood this morning), but with Hgb drift                            I elected to treat the site to reduce risk for                            rebleeding. Recommendation:           - Return patient to hospital ward for ongoing care.                           - Full liquid diet.                           - Continue present medications.                           - Continue IV protonix drip                           - Continue to trend Hgb, transfuse for PRBC lower                            than 8                           - We will reassess him tomorrow, call with                            questions.    Subjective: Patient seen and examined at bedside.  Feels okay to go home today.  Denies any worsening abdominal pain, nausea, vomiting or fever.  Discharge Exam: Vitals:   12/25/22 0721 12/25/22 1112  BP: 115/71 101/62  Pulse:    Resp: 20 18  Temp: 98 F (36.7 C) 98 F (36.7 C)  SpO2: 97% 97%    General: Pt is alert, awake, not in acute distress.  On room air.  Flat affect.  Slow to respond. Cardiovascular: rate controlled, S1/S2 + Respiratory: bilateral decreased breath sounds at bases with some scattered crackles Abdominal: Soft, NT, ND, bowel sounds + Extremities:  Mild lower extremity edema; no cyanosis    The results of significant diagnostics from this hospitalization (including imaging, microbiology, ancillary and laboratory) are listed below for reference.     Microbiology: Recent Results (from the past 240 hour(s))  MRSA Next Gen by PCR, Nasal     Status: Abnormal   Collection Time: 12/21/22 12:55 AM   Specimen: Nasal Mucosa; Nasal Swab  Result Value Ref Range Status   MRSA by PCR Next Gen DETECTED (A) NOT DETECTED Final    Comment: RESULTS CALLED TO, READ BACK BY AND VERIFIED WITH RN A.COOPER ON 12/21/22 AT 0301 BY NM (NOTE) The GeneXpert MRSA Assay (FDA approved for NASAL specimens only), is one component of a comprehensive MRSA colonization surveillance program. It is not intended to diagnose MRSA infection nor to guide or monitor treatment for MRSA infections. Test performance is not FDA approved in patients less than 21 years old. Performed at [facility], [address] [place], [place] [address]      Labs: BNP (last 3 results) Recent Labs    03/08/22 1800 04/07/22 2019  BNP 85.3 80.2   Basic Metabolic Panel: Recent Labs  Lab 12/21/22 0218 12/22/22 0206 12/23/22 0227 12/24/22 0213 12/25/22 0217  NA 138 137 133* 138 138  K 4.8 5.2* 3.7 3.9 3.8  CL 110 109 106 108 105  CO2 23 19* 19* 23 26  GLUCOSE 97 163* 129* 129* 153*  BUN 34* 51* 27* 19 17  CREATININE 1.15 1.45* 1.09 1.11 1.08  CALCIUM 8.0* 8.1* 7.6* 8.2* 8.5*  MG  --  2.1  --  2.0 2.0   Liver Function Tests: Recent Labs  Lab 12/20/22 2116 12/21/22 0218  AST 18 16  ALT 19 18  ALKPHOS 40 39  BILITOT 0.4 0.8  PROT 5.3* 4.9*  ALBUMIN 3.3* 3.1*   Recent Labs  Lab 12/20/22 2116  LIPASE 35   No results for input(s): "AMMONIA" in the last 168 hours. CBC: Recent Labs  Lab 12/20/22 2116 12/21/22 0218 12/21/22 1115 12/22/22 0206 12/22/22 0825 12/23/22 0227 12/23/22 1003 12/24/22 0213 12/24/22 1930 12/25/22 0217  WBC 7.6 4.8  --  7.0   --  5.5  --  4.4  --  4.7  NEUTROABS 6.1  --   --  5.7  --   --   --  2.9  --  2.9  HGB 12.7* 11.3*   < > 9.0* 8.8* 7.4* 8.0* 7.5* 8.3* 9.0*  HCT 39.0 34.5*   < > 27.4* 26.7* 22.7*  --  23.0* 25.4* 27.1*  MCV 96.3 95.3  --  96.5  --  95.4  --  95.8  --  96.1  PLT 115* 104*  --  112*  --  90*  --  90*  --  100*   < > = values in this interval not displayed.   Cardiac Enzymes: No results for input(s): "CKTOTAL", "CKMB", "CKMBINDEX", "TROPONINI" in the last 168 hours. BNP: Invalid input(s): "POCBNP" CBG: Recent Labs  Lab 12/24/22 2015 12/25/22 0057 12/25/22 0539 12/25/22 0719 12/25/22 1111  GLUCAP 181*  145* 156* 157* 172*   D-Dimer No results for input(s): "DDIMER" in the last 72 hours. Hgb A1c No results for input(s): "HGBA1C" in the last 72 hours. Lipid Profile No results for input(s): "CHOL", "HDL", "LDLCALC", "TRIG", "CHOLHDL", "LDLDIRECT" in the last 72 hours. Thyroid function studies No results for input(s): "TSH", "T4TOTAL", "T3FREE", "THYROIDAB" in the last 72 hours.  Invalid input(s): "FREET3" Anemia work up No results for input(s): "VITAMINB12", "FOLATE", "FERRITIN", "TIBC", "IRON", "RETICCTPCT" in the last 72 hours. Urinalysis    Component Value Date/Time   COLORURINE YELLOW 12/21/2022 0038   APPEARANCEUR CLEAR 12/21/2022 0038   APPEARANCEUR Clear 08/02/2022 1401   LABSPEC 1.041 (H) 12/21/2022 0038   LABSPEC 1.011 01/23/2013 0553   PHURINE 5.0 12/21/2022 0038   GLUCOSEU >=500 (A) 12/21/2022 0038   GLUCOSEU Negative 01/23/2013 0553   HGBUR NEGATIVE 12/21/2022 0038   BILIRUBINUR NEGATIVE 12/21/2022 0038   BILIRUBINUR Negative 08/02/2022 1401   BILIRUBINUR Negative 01/23/2013 0553   KETONESUR 20 (A) 12/21/2022 0038   PROTEINUR NEGATIVE 12/21/2022 0038   UROBILINOGEN negative (A) 05/26/2022 1607   NITRITE NEGATIVE 12/21/2022 0038   LEUKOCYTESUR NEGATIVE 12/21/2022 0038   LEUKOCYTESUR Negative 01/23/2013 0553   Sepsis Labs Recent Labs  Lab 12/22/22 0206  12/23/22 0227 12/24/22 0213 12/25/22 0217  WBC 7.0 5.5 4.4 4.7   Microbiology Recent Results (from the past 240 hour(s))  MRSA Next Gen by PCR, Nasal     Status: Abnormal   Collection Time: 12/21/22 12:55 AM   Specimen: Nasal Mucosa; Nasal Swab  Result Value Ref Range Status   MRSA by PCR Next Gen DETECTED (A) NOT DETECTED Final    Comment: RESULTS CALLED TO, READ BACK BY AND VERIFIED WITH RN A.COOPER ON 12/21/22 AT 0301 BY NM (NOTE) The GeneXpert MRSA Assay (FDA approved for NASAL specimens only), is one component of a comprehensive MRSA colonization surveillance program. It is not intended to diagnose MRSA infection nor to guide or monitor treatment for MRSA infections. Test performance is not FDA approved in patients less than 51 years old. Performed at [facility], [address] [place], [place] [address]      Time coordinating discharge: 35 minutes  SIGNED:   Glade Lloyd, MD  [facility] 12/25/2022, 11:33 AM

## 2022-12-25 NOTE — Plan of Care (Signed)

## 2022-12-25 NOTE — TOC Transition Note (Signed)
Transition of Care [facility]) - CM/SW Discharge Note   Patient Details  Name: [name] Cook MRN: [id number] Date of Birth: 07-09-1950  [facility] CM/SW Contact:  Leone Haven, RN Phone Number: 12/25/2022, 12:06 PM   Clinical Narrative:    Patient is for dc today, he lives at home with girlfriend and her mother, he states his step son will come to transport him home today.  He has insurance and PCP on file. He does not currently have any HH services or DME at home.  He gets medications from [facility] in [place].    Final next level of care: Home/Self Care Barriers to Discharge: No Barriers Identified   Patient Goals and CMS Choice   Choice offered to / list presented to : NA  Discharge Placement                         Discharge Plan and Services Additional resources added to the After Visit Summary for                  DME Arranged: N/A DME Agency: NA       HH Arranged: NA          Social Determinants of Health (SDOH) Interventions SDOH Screenings   Food Insecurity: No Food Insecurity (12/21/2022)  Housing: Low Risk  (12/21/2022)  Transportation Needs: No Transportation Needs (12/21/2022)  Utilities: Not At Risk (12/21/2022)  Alcohol Screen: Low Risk  (10/10/2022)  Depression (PHQ2-9): Low Risk  (10/27/2022)  Financial Resource Strain: Low Risk  (10/10/2022)  Physical Activity: Insufficiently Active (10/10/2022)  Social Connections: Socially Isolated (10/10/2022)  Stress: No Stress Concern Present (10/10/2022)  Tobacco Use: Medium Risk (12/23/2022)     Readmission Risk Interventions    03/10/2022    8:32 AM  Readmission Risk Prevention Plan  Transportation Screening Complete  PCP or Specialist Appt within 5-7 Days Complete  Home Care Screening Complete  Medication Review (RN CM) Complete

## 2022-12-25 NOTE — Progress Notes (Signed)
Mobility Specialist Progress Note:   12/25/22 1000  Mobility  Activity Ambulated with assistance in hallway  Level of Assistance Standby assist, set-up cues, supervision of patient - no hands on  Assistive Device Front wheel walker  Distance Ambulated (ft) 340 ft  Activity Response Tolerated well  Mobility Referral Yes  $Mobility charge 1 Mobility  Mobility Specialist Start Time (ACUTE ONLY) [id number]  Mobility Specialist Stop Time (ACUTE ONLY) [id number]  Mobility Specialist Time Calculation (min) (ACUTE ONLY) 11 min    Pre Mobility: 72 HR During Mobility: 88 HR Post Mobility:  81 HR  Pt received in bed, agreeable to mobility. Asymptomatic throughout w/ no complaints. Pt left in chair with call bell and all needs met.  [place] Earlene Plater Mobility Specialist Please contact via Special educational needs teacher or Rehab office at [phone]

## 2022-12-25 NOTE — Care Management Important Message (Signed)
Important Message  Patient Details  Name: [name] Cook MRN: [id number] Date of Birth: April 13, 1951   Medicare Important Message Given:  Yes     Dorena Bodo 12/25/2022, 2:42 PM

## 2022-12-27 ENCOUNTER — Telehealth: Payer: Self-pay

## 2022-12-27 NOTE — Transitions of Care (Post Inpatient/ED Visit) (Signed)
   12/27/2022  Name: [name] MRN: [id number] DOB: 1950-06-16  Today's TOC FU Call Status: Today's TOC FU Call Status:: Unsuccessful Call (1st Attempt) Unsuccessful Call (1st Attempt) Date: 12/27/22  Attempted to reach the patient regarding the most recent Inpatient/ED visit.  Follow Up Plan: Additional outreach attempts will be made to reach the patient to complete the Transitions of Care (Post Inpatient/ED visit) call.   Lonia Chimera RN, BSN, CEN [facility] [phone]

## 2022-12-28 ENCOUNTER — Ambulatory Visit: Payer: 59 | Admitting: Cardiology

## 2022-12-29 ENCOUNTER — Telehealth: Payer: Self-pay

## 2022-12-29 NOTE — Transitions of Care (Post Inpatient/ED Visit) (Signed)
12/29/2022  Name: [name] Cook MRN: [id number] DOB: 15-[name]-1951  Today's TOC FU Call Status: Today's TOC FU Call Status:: Successful TOC FU Call Completed Patient's Name and Date of Birth confirmed.  Transition Care Management Follow-up Telephone Call Date of Discharge: 12/25/22 Discharge Facility: [facility]) Type of Discharge: Inpatient Admission Primary Inpatient Discharge Diagnosis:: "acute upper GI bleed" How have you been since you were released from the hospital?: Better (Pt states he is 'doing better but still a little weak." Appetite is good. Denies any GI issues. LBM yest-states "still a little dark-was told it would take a little while to clear up.") Any questions or concerns?: No  Items Reviewed: Did you receive and understand the discharge instructions provided?: Yes Medications obtained,verified, and reconciled?: Yes (Medications Reviewed) Any new allergies since your discharge?: No Dietary orders reviewed?: Yes Type of Diet Ordered:: low salt/heart healthy/carb modified Do you have support at home?: Yes People in Home: significant other Name of Support/Comfort Primary Source: Helmut Muster  Medications Reviewed Today: Medications Reviewed Today     Reviewed by Charlyn Minerva, RN (Registered Nurse) on 12/29/22 at [phone]  Med List Status: <None>   Medication Order Taking? Sig Documenting Provider Last Dose Status Informant  albuterol (VENTOLIN HFA) 108 (90 Base) MCG/ACT inhaler [id number] Yes INHALE 2 PUFFS BY MOUTH EVERY 4 HOURS AS NEEDED FOR WHEEZE OR FOR SHORTNESS OF BREATH [name], NP Taking Active Self, Pharmacy Records  Ascorbic Acid (VITAMIN C) 1000 MG tablet [id number] Yes Take 2,000 mg by mouth daily. [provider] Taking Active Self, Pharmacy Records  aspirin EC 81 MG tablet [id number] Yes Take 1 tablet (81 mg total) by mouth daily. Swallow whole. Glade Lloyd, MD Taking Active   blood glucose meter kit and supplies KIT [id number]   Dispense based on patient and insurance preference. Use up to four times daily as directed. (FOR ICD-9 250.00, 250.01). [name], NP  Active Self, Pharmacy Records  budesonide-formoterol [facility]) 160-4.5 MCG/ACT inhaler [id number] Yes INHALE 2 PUFFS INTO THE LUNGS TWICE A DAY  Patient taking differently: Inhale 2 puffs into the lungs daily as needed (for shortness of breath and wheezing).   [name], NP Taking Active Self, Pharmacy Records  fluticasone [facility]) 50 MCG/ACT nasal spray [id number] Yes PLACE 1 SPRAY INTO BOTH NOSTRILS DAILY AS NEEDED FOR ALLERGIES OR RHINITIS. [name], NP Taking Active Self, Pharmacy Records  gabapentin (NEURONTIN) 300 MG capsule [id number] Yes TAKE 1 CAPSULE BY MOUTH EVERY MORNING AND 2 CAPSULES BY MOUTH AT NIGHT FOR NEUROPATHY [name], NP Taking Active Self, Pharmacy Records  glipiZIDE (GLUCOTROL XL) 10 MG 24 hr tablet [id number] Yes TAKE 1 TABLET (10 MG TOTAL) BY MOUTH DAILY WITH BREAKFAST. FOR DIABETES. [name], NP Taking Active Self, Pharmacy Records  glucose blood (ACCU-CHEK GUIDE) test strip [id number] Yes TEST FOUR TIMES A DAY [name], NP Taking Active Self, Pharmacy Records  isosorbide mononitrate (IMDUR) 60 MG 24 hr tablet [id number] Yes Take 1 tablet (60 mg total) by mouth daily. Janetta Hora, PA-C Taking Active Self, Pharmacy Records  JARDIANCE 25 MG TABS tablet [id number] Yes TAKE 1 TABLET (25 MG TOTAL) BY MOUTH DAILY BEFORE BREAKFAST. FOR DIABETES. [name], NP Taking Active Self, Pharmacy Records  nitroGLYCERIN (NITROSTAT) 0.4 MG SL tablet [id number]  Place 1 tablet (0.4 mg total) under the tongue every 5 (five) minutes as needed for chest pain. Do not exceed 3 tablets in 24 hours. [name],  NP  Active Self, Pharmacy Records           Med Note [facility] M   Tue Oct 10, 2022 11:20 AM) Has not had to use  pantoprazole (PROTONIX) 40 MG tablet [id number] Yes Take 1  tablet (40 mg total) by mouth 2 (two) times daily before a meal. Glade Lloyd, MD Taking Active   rosuvastatin (CRESTOR) 20 MG tablet [id number] Yes TAKE 1 TABLET BY MOUTH EVERY DAY IN THE EVENING FOR CHOLESTEROL [name], NP Taking Active Self, Pharmacy Records  sertraline (ZOLOFT) 100 MG tablet [id number] Yes TAKE 1 TABLET (100 MG TOTAL) BY MOUTH DAILY. FOR ANXIETY AND DEPRESSION. [name], NP Taking Active Self, Pharmacy Records  tamsulosin [facility]) 0.4 MG CAPS capsule [id number] Yes TAKE 1 CAPSULE (0.4 MG TOTAL) BY MOUTH 2 (TWO) TIMES DAILY. Carmina Miller, MD Taking Active Self, Pharmacy Records           Med Note Lia Hopping, Delsa Grana Dec 20, 2022 11:35 PM) Per pt he still takes this daily. Unable to find dispense report  TYLENOL 500 MG tablet [id number] Yes Take 1,000 mg by mouth daily. [provider] Taking Active Self, Pharmacy Records            Home Care and Equipment/Supplies: Were Home Health Services Ordered?: NA Any new equipment or medical supplies ordered?: NA  Functional Questionnaire: Do you need assistance with bathing/showering or dressing?: No Do you need assistance with meal preparation?: No Do you need assistance with eating?: No Do you have difficulty maintaining continence: No Do you need assistance with getting out of bed/getting out of a chair/moving?: No Do you have difficulty managing or taking your medications?: No  Follow up appointments reviewed: PCP Follow-up appointment confirmed?: Yes Date of PCP follow-up appointment?: 01/02/23 Follow-up Provider: [facility] Follow-up appointment confirmed?: No Reason Specialist Follow-Up Not Confirmed: Patient has Specialist Provider Number and will Call for Appointment (pt aware to call and follow up with GI office regarding appt) Do you need transportation to your follow-up appointment?: No (pt confirms he is able to get to appts) Do you understand  care options if your condition(s) worsen?: Yes-patient verbalized understanding  SDOH Interventions Today    Flowsheet Row Most Recent Value  SDOH Interventions   Food Insecurity Interventions Intervention Not Indicated  Transportation Interventions Intervention Not Indicated      .thntp Interventions Today    Flowsheet Row Most Recent Value  Chronic Disease   Chronic disease during today's visit Diabetes  General Interventions   General Interventions Discussed/Reviewed General Interventions Discussed, Doctor Visits, Durable Medical Equipment (DME)  Doctor Visits Discussed/Reviewed Doctor Visits Discussed, PCP, Specialist  Durable Medical Equipment (DME) Glucomoter  PCP/Specialist Visits Compliance with follow-up visit  Education Interventions   Education Provided Provided Education  Provided Verbal Education On Nutrition, When to see the doctor, Medication  Nutrition Interventions   Nutrition Discussed/Reviewed Nutrition Discussed, Adding fruits and vegetables, Decreasing sugar intake, Decreasing salt, Decreasing fats, Increasing proteins  Pharmacy Interventions   Pharmacy Dicussed/Reviewed Pharmacy Topics Discussed, Medications and their functions  Safety Interventions   Safety Discussed/Reviewed Safety Discussed       Alessandra Grout [facility] Care Management Care Management Community Coordinator Direct Phone: [phone] Toll Free: [phone] Fax: [phone]

## 2023-01-02 ENCOUNTER — Telehealth: Payer: Self-pay | Admitting: Primary Care

## 2023-01-02 ENCOUNTER — Telehealth: Payer: Self-pay | Admitting: Gastroenterology

## 2023-01-02 ENCOUNTER — Ambulatory Visit: Payer: 59 | Admitting: Primary Care

## 2023-01-02 ENCOUNTER — Encounter: Payer: Self-pay | Admitting: Primary Care

## 2023-01-02 VITALS — BP 114/62 | HR 82 | Temp 97.2°F | Ht 71.0 in | Wt 202.0 lb

## 2023-01-02 DIAGNOSIS — F32A Depression, unspecified: Secondary | ICD-10-CM

## 2023-01-02 DIAGNOSIS — F419 Anxiety disorder, unspecified: Secondary | ICD-10-CM | POA: Diagnosis not present

## 2023-01-02 DIAGNOSIS — K269 Duodenal ulcer, unspecified as acute or chronic, without hemorrhage or perforation: Secondary | ICD-10-CM

## 2023-01-02 DIAGNOSIS — K922 Gastrointestinal hemorrhage, unspecified: Secondary | ICD-10-CM | POA: Diagnosis not present

## 2023-01-02 LAB — CBC
HCT: 32.4 % — ABNORMAL LOW (ref 39.0–52.0)
Hemoglobin: 10.5 g/dL — ABNORMAL LOW (ref 13.0–17.0)
MCHC: 32.4 g/dL (ref 30.0–36.0)
MCV: 95 fl (ref 78.0–100.0)
Platelets: 222 10*3/uL (ref 150.0–400.0)
RBC: 3.41 Mil/uL — ABNORMAL LOW (ref 4.22–5.81)
RDW: 15.5 % (ref 11.5–15.5)
WBC: 5.5 10*3/uL (ref 4.0–10.5)

## 2023-01-02 LAB — BASIC METABOLIC PANEL
BUN: 16 mg/dL (ref 6–23)
CO2: 24 meq/L (ref 19–32)
Calcium: 9.3 mg/dL (ref 8.4–10.5)
Chloride: 106 meq/L (ref 96–112)
Creatinine, Ser: 1.08 mg/dL (ref 0.40–1.50)
GFR: 69.47 mL/min (ref >60.00)
Glucose, Bld: 119 mg/dL — ABNORMAL HIGH (ref 70–99)
Potassium: 4.8 meq/L (ref 3.5–5.1)
Sodium: 138 meq/L (ref 135–145)

## 2023-01-02 LAB — IBC + FERRITIN
Ferritin: 17.5 ng/mL — ABNORMAL LOW (ref 22.0–322.0)
Iron: 31 ug/dL — ABNORMAL LOW (ref 42–165)
Saturation Ratios: 8.1 % — ABNORMAL LOW (ref 20.0–50.0)
TIBC: 385 ug/dL (ref 250.0–450.0)
Transferrin: 275 mg/dL (ref 212.0–360.0)

## 2023-01-02 MED ORDER — PANTOPRAZOLE SODIUM 40 MG PO TBEC
40.0000 mg | DELAYED_RELEASE_TABLET | Freq: Two times a day (BID) | ORAL | 1 refills | Status: DC
Start: 2023-01-02 — End: 2023-02-13

## 2023-01-02 NOTE — Telephone Encounter (Signed)
Prescription Request  01/02/2023  LOV: 01/02/2023  What is the name of the medication or equipment? pantoprazole (PROTONIX) 40 MG tablet   Have you contacted your pharmacy to request a refill? No   Which pharmacy would you like this sent to?  [id number] Judithann Sheen, [place] - [address] [place] [address] Phone: [phone] Fax: [phone]     Patient notified that their request is being sent to the clinical staff for review and that they should receive a response within 2 business days.   Please advise at Mobile [phone] (mobile)

## 2023-01-02 NOTE — Assessment & Plan Note (Signed)
Deteriorated.  He has failed 5 agents for management of irritability/anxiety/depression. Recommended psychiatry evaluation for further treatment options.  He agrees.  Referral placed.

## 2023-01-02 NOTE — Addendum Note (Signed)
Addended by: [name] on: 01/02/2023 03:09 PM   Modules accepted: Orders

## 2023-01-02 NOTE — Assessment & Plan Note (Signed)
No alarm signs today. Recent hospitalization.  Hospital notes, labs, imaging reviewed.  Repeat CBC pending.  Add iron studies.  Discussed to pick up the prescription for pantoprazole from the pharmacy and start taking it twice daily.  Phone number provided to patient today to set up GI hospital follow-up.

## 2023-01-02 NOTE — Assessment & Plan Note (Signed)
Discussed to start pantoprazole 40 mg twice daily as prescribed. He will pick up from the pharmacy today.  He will schedule a follow-up with GI, phone number provided today. Repeat CBC pending.

## 2023-01-02 NOTE — Telephone Encounter (Signed)
Requested Prescriptions   Signed Prescriptions Disp Refills   pantoprazole (PROTONIX) 40 MG tablet 180 tablet 1    Sig: Take 1 tablet (40 mg total) by mouth 2 (two) times daily before a meal. For ulcer    Authorizing Provider: [name]   Refill(s) sent to pharmacy.

## 2023-01-02 NOTE — Patient Instructions (Addendum)
Call the GI doctors office to set up an appointment with Dr. Tobi Bastos.  His phone number is [phone]  Start taking pantoprazole medication for your stomach.  Take 1 tablet by mouth twice daily with a meal.  Stop by the lab prior to leaving today. I will notify you of your results once received.   You will either be contacted via phone regarding your referral to psychiatry, or you may receive a letter on your MyChart portal from our referral team with instructions for scheduling an appointment. Please let us know if you have not been contacted by anyone within two weeks.  It was a pleasure to see you today!

## 2023-01-02 NOTE — Telephone Encounter (Signed)
Received incoming referral for the patient, called the patient and scheduled an appointment. I sent out an appointment reminder with a No-Show letter and a map.

## 2023-01-02 NOTE — Progress Notes (Signed)
Subjective:    Patient ID: [name] Cook, male    DOB: 02-02-1951, 72 y.o.   MRN: [id number]  HPI  [name] Cook is a very pleasant 72 y.o. male with a significant medical history including hypertension, atrial flutter, CAD with acute MI, OSA, GI bleed, type 2 diabetes, osteoarthritis, CKD, prostate cancer who presents today for hospital follow-up and to discuss ongoing irritability.  He presented to [facility] ED on 12/20/2022 for abdominal pain,  hematemesis, and presyncope.  History LAAO with Watchman on 06/22/22, has been tapering off of DAPT. his cardiologist told him to discontinue his clopidogrel that day.  He was admitted for upper GI bleed.  During his hospital stay he was initiated on pantoprazole IV.  He was noted to have mild anemia.  GI consulted who completed an upper endoscopy twice which revealed small hiatal hernia, reflux esophagitis, otherwise normal.  There was a red blood clot noted to the gastric fundus, gastric atrium, pylorus.  There were few ulcers noted to the duodenum, actively bleeding.  He underwent cauterization of the bleeding.  He received 2 units of packed red blood cells during his admission.  Symptoms improved and he was discharged home on 12/25/2022 with recommendations for PCP follow-up, GI follow-up, repeat CBC/BMP.  He was discharged home with a prescription for pantoprazole 40 mg twice daily.  Since his discharge home he's still feeling weak. He denies nausea, vomiting, bloody stools. He does experience intermittent dizziness. He has yet to start aspirin. He has not heard from GI regarding an appointment.  He does not believe he has been taking pantoprazole which was prescribed at discharge.  He denies rectal bleeding, nausea, vomiting, hematemesis.  He would also like to discuss anxiety and depression. Symptoms include irritability, feeling down and depressed, little motivation, some worrying, some restlessness.  He has been managed on citalopram, Lexapro, Zoloft, Cymbalta,  Wellbutrin which were effective temporarily or ineffective.  He is mostly concerned about his irritability, has become irritable with his mother-in-law which has caused problems within his home.  He has not seen psychiatry.  BP Readings from Last 3 Encounters:  01/02/23 114/62  12/25/22 101/62  12/20/22 126/80        Review of Systems  Constitutional:  Positive for fatigue. Negative for unexpected weight change.  Respiratory:  Negative for shortness of breath.   Cardiovascular:  Negative for chest pain.  Gastrointestinal:  Negative for blood in stool, constipation, diarrhea, nausea and vomiting.  Skin:  Negative for rash.  Neurological:  Positive for light-headedness. Negative for headaches.         Past Medical History:  Diagnosis Date   Acute bronchitis with COPD (HCC) 03/07/2021   Acute pulmonary embolism (HCC) 03/08/2022   Arthritis    "knees and lower back" (03/14/2013)   Atrial flutter (HCC)    radiofrequency ablation in 2001   CAD (coronary artery disease)    a. Nonobstructive. Cardiac cath in 2001-50% mid RI, normal LM, LAD, RCA b. cath 10/16/2014 95% mid RCA treated with DES, 99% ost D1 medical management due to small aneurysmal segment   Chronic anticoagulation    chronic Coumadin anticoagulation   Chronic obstructive pulmonary disease (HCC) 04/20/2011   Diabetes mellitus, type 2 (HCC)    Diaphoresis 10/26/2021   Elevated lipase 06/23/2021   GERD (gastroesophageal reflux disease)    GIB (gastrointestinal bleeding) 03/08/2022   Hyperlipidemia    Hypertension    with hypertensive heart disease   Left knee pain 10/25/2017  medial   Obesity    Persistent atrial fibrillation (HCC)    recurrent atrial flutter since 2001 s/p DCCVs, multiple failed AADs, h/o tachy-mediated cardiomyopathy   Presence of Watchman left atrial appendage closure device 06/22/2022   Watchman  31mm FLX placed with Dr. Excell Seltzer   Shortness of breath    "can come on at any time" (03/14/2013)    Sleep apnea    "dx'd; couldn't wear the mask" (03/14/2013)   Symptomatic anemia 03/08/2022   Tobacco abuse    Urinary tract infection without hematuria 05/26/2022    Social History   Socioeconomic History   Marital status: Widowed    Spouse name: Not on file   Number of children: 1   Years of education: Not on file   Highest education level: Not on file  Occupational History   Occupation: Unemployed    Employer: UNEMPLOYED  Tobacco Use   Smoking status: Former    Current packs/day: 0.00    Average packs/day: 1 pack/day for 42.0 years (42.0 ttl pk-yrs)    Types: Cigarettes    Start date: 12/31/1971    Quit date: 12/30/2013    Years since quitting: 9.0   Smokeless tobacco: Never   Tobacco comments:    Former smoker 03/20/22  Vaping Use   Vaping status: Never Used  Substance and Sexual Activity   Alcohol use: Not Currently    Comment: 03/14/2013 "stopped drinking back in 2002; never had problem w/it"   Drug use: No   Sexual activity: Not Currently  Other Topics Concern   Not on file  Social History Narrative   Single.   Retired.    1 son, deceased.    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   Enjoys playing on the computer.       Social Determinants of Health   Financial Resource Strain: Low Risk  (10/10/2022)   Overall Financial Resource Strain (CARDIA)    Difficulty of Paying Living Expenses: Not hard at all  Food Insecurity: No Food Insecurity (12/29/2022)   Hunger Vital Sign    Worried About Running Out of Food in the Last Year: Never true    Ran Out of Food in the Last Year: Never true  Transportation Needs: No Transportation Needs (12/29/2022)   PRAPARE - Administrator, Civil Service (Medical): No    Lack of Transportation (Non-Medical): No  Physical Activity: Insufficiently Active (10/10/2022)   Exercise Vital Sign    Days of Exercise per Week: 3 days    Minutes of Exercise per Session: 30 min   Stress: No Stress Concern Present (10/10/2022)   [employer] of Occupational Health - Occupational Stress Questionnaire    Feeling of Stress : Not at all  Social Connections: Socially Isolated (10/10/2022)   Social Connection and Isolation Panel [NHANES]    Frequency of Communication with Friends and Family: More than three times a week    Frequency of Social Gatherings with Friends and Family: Three times a week    Attends Religious Services: Never    Active Member of Clubs or Organizations: No    Attends Banker Meetings: Never    Marital Status: Widowed  Intimate Partner Violence: Not At Risk (12/21/2022)   Humiliation, Afraid, Rape, and Kick questionnaire    Fear of Current or Ex-Partner: No    Emotionally Abused: No    Physically Abused: No    Sexually Abused: No    Past Surgical History:  Procedure Laterality Date   ATRIAL FLUTTER ABLATION  2002   atrial flutter; subsequently developed atrial fibrillation   AV NODE ABLATION  01/24/2013   CARDIAC CATHETERIZATION  2002   CARDIAC CATHETERIZATION N/A 10/16/2014   Procedure: Left Heart Cath and Coronary Angiography;  Surgeon: Tonny Bollman, MD;  Location: [facility] INVASIVE CV LAB;  Service: Cardiovascular;  Laterality: N/A;   CARDIOVERSION  05/31/2011   Procedure: CARDIOVERSION;  Surgeon: [name]. [name], MD;  Location: [facility] ORS;  Service: Cardiovascular;  Laterality: N/A;   CARPAL TUNNEL RELEASE Left 1980's   COLONOSCOPY WITH PROPOFOL N/A 12/30/2018   Procedure: COLONOSCOPY WITH PROPOFOL;  Surgeon: Wyline Mood, MD;  Location: [facility] ENDOSCOPY;  Service: Gastroenterology;  Laterality: N/A;   COLONOSCOPY WITH PROPOFOL N/A 12/04/2019   Procedure: COLONOSCOPY WITH PROPOFOL;  Surgeon: Wyline Mood, MD;  Location: [facility] ENDOSCOPY;  Service: Gastroenterology;  Laterality: N/A;   COLONOSCOPY WITH PROPOFOL N/A 06/13/2021   Procedure: COLONOSCOPY WITH PROPOFOL;  Surgeon: Wyline Mood, MD;  Location: [facility] ENDOSCOPY;  Service:  Gastroenterology;  Laterality: N/A;   ESOPHAGOGASTRODUODENOSCOPY N/A 03/01/2022   Procedure: ESOPHAGOGASTRODUODENOSCOPY (EGD);  Surgeon: Vida Rigger, MD;  Location: [facility] ENDOSCOPY;  Service: Gastroenterology;  Laterality: N/A;   ESOPHAGOGASTRODUODENOSCOPY (EGD) WITH PROPOFOL N/A 12/21/2022   Procedure: ESOPHAGOGASTRODUODENOSCOPY (EGD) WITH PROPOFOL;  Surgeon: Benancio Deeds, MD;  Location: [facility] ENDOSCOPY;  Service: Gastroenterology;  Laterality: N/A;   ESOPHAGOGASTRODUODENOSCOPY (EGD) WITH PROPOFOL N/A 12/23/2022   Procedure: ESOPHAGOGASTRODUODENOSCOPY (EGD) WITH PROPOFOL;  Surgeon: Benancio Deeds, MD;  Location: [facility] ENDOSCOPY;  Service: Gastroenterology;  Laterality: N/A;   GIVENS CAPSULE STUDY Left 03/10/2022   Procedure: GIVENS CAPSULE STUDY;  Surgeon: Willis Modena, MD;  Location: [facility] ENDOSCOPY;  Service: Gastroenterology;  Laterality: Left;   HEMOSTASIS CLIP PLACEMENT  12/23/2022   Procedure: HEMOSTASIS CLIP PLACEMENT;  Surgeon: Benancio Deeds, MD;  Location: [facility] ENDOSCOPY;  Service: Gastroenterology;;   HEMOSTASIS CONTROL  12/21/2022   Procedure: HEMOSTASIS CONTROL;  Surgeon: Benancio Deeds, MD;  Location: [facility] ENDOSCOPY;  Service: Gastroenterology;;   INSERT / REPLACE / REMOVE PACEMAKER  01/24/2013    Medtronic Adapta L dual-chamber pacemaker, serial number [id number]    LEFT ATRIAL APPENDAGE OCCLUSION N/A 06/22/2022   Procedure: LEFT ATRIAL APPENDAGE OCCLUSION;  Surgeon: Tonny Bollman, MD;  Location: [facility] INVASIVE CV LAB;  Service: Cardiovascular;  Laterality: N/A;   LEFT HEART CATH AND CORONARY ANGIOGRAPHY N/A 06/07/2018   Procedure: LEFT HEART CATH AND CORONARY ANGIOGRAPHY;  Surgeon: Tonny Bollman, MD;  Location: [facility] INVASIVE CV LAB;  Service: Cardiovascular;  Laterality: N/A;   LEFT HEART CATHETERIZATION WITH CORONARY ANGIOGRAM N/A 03/17/2013   Procedure: LEFT HEART CATHETERIZATION WITH CORONARY ANGIOGRAM;  Surgeon: Kathleene Hazel, MD; LAD mild dz, D1 branch 100%,  inferior branch 99%, CFX OK, RCA 50%, EF 65%     LOOP RECORDER IMPLANT  2002   PERMANENT PACEMAKER INSERTION N/A 01/24/2013   Procedure: PERMANENT PACEMAKER INSERTION;  Surgeon: Marinus Maw, MD;  Location: [facility] CATH LAB;  Service: Cardiovascular;  Laterality: N/A;   TEE WITHOUT CARDIOVERSION N/A 06/22/2022   Procedure: TRANSESOPHAGEAL ECHOCARDIOGRAM;  Surgeon: Tonny Bollman, MD;  Location: [facility] INVASIVE CV LAB;  Service: Cardiovascular;  Laterality: N/A;   TIBIAL TUBERCLERPLASTY  ~ 2003    Family History  Problem Relation Age of Onset   Alzheimer's disease Mother    Osteoporosis Mother     Allergies  Allergen Reactions   Januvia [Sitagliptin] Other (See Comments)    Elevated lipase   Metformin And Related Diarrhea  Current Outpatient Medications on File Prior to Visit  Medication Sig Dispense Refill   albuterol (VENTOLIN HFA) 108 (90 Base) MCG/ACT inhaler INHALE 2 PUFFS BY MOUTH EVERY 4 HOURS AS NEEDED FOR WHEEZE OR FOR SHORTNESS OF BREATH 8.5 each 0   Ascorbic Acid (VITAMIN C) 1000 MG tablet Take 2,000 mg by mouth daily.     aspirin EC 81 MG tablet Take 1 tablet (81 mg total) by mouth daily. Swallow whole.     blood glucose meter kit and supplies KIT Dispense based on patient and insurance preference. Use up to four times daily as directed. (FOR ICD-9 250.00, 250.01). 1 each 0   budesonide-formoterol (SYMBICORT) 160-4.5 MCG/ACT inhaler INHALE 2 PUFFS INTO THE LUNGS TWICE A DAY (Patient taking differently: Inhale 2 puffs into the lungs daily as needed (for shortness of breath and wheezing).) 10.2 each 5   fluticasone (FLONASE) 50 MCG/ACT nasal spray PLACE 1 SPRAY INTO BOTH NOSTRILS DAILY AS NEEDED FOR ALLERGIES OR RHINITIS. 48 mL 0   gabapentin (NEURONTIN) 300 MG capsule TAKE 1 CAPSULE BY MOUTH EVERY MORNING AND 2 CAPSULES BY MOUTH AT NIGHT FOR NEUROPATHY 270 capsule 2   glipiZIDE (GLUCOTROL XL) 10 MG 24 hr tablet TAKE 1 TABLET (10 MG TOTAL) BY MOUTH DAILY WITH BREAKFAST. FOR  DIABETES. 90 tablet 1   glucose blood (ACCU-CHEK GUIDE) test strip TEST FOUR TIMES A DAY 400 strip 0   isosorbide mononitrate (IMDUR) 60 MG 24 hr tablet Take 1 tablet (60 mg total) by mouth daily. 90 tablet 3   JARDIANCE 25 MG TABS tablet TAKE 1 TABLET (25 MG TOTAL) BY MOUTH DAILY BEFORE BREAKFAST. FOR DIABETES. 90 tablet 1   nitroGLYCERIN (NITROSTAT) 0.4 MG SL tablet Place 1 tablet (0.4 mg total) under the tongue every 5 (five) minutes as needed for chest pain. Do not exceed 3 tablets in 24 hours. 25 tablet 0   pantoprazole (PROTONIX) 40 MG tablet Take 1 tablet (40 mg total) by mouth 2 (two) times daily before a meal. 60 tablet 0   rosuvastatin (CRESTOR) 20 MG tablet TAKE 1 TABLET BY MOUTH EVERY DAY IN THE EVENING FOR CHOLESTEROL 90 tablet 3   sertraline (ZOLOFT) 100 MG tablet TAKE 1 TABLET (100 MG TOTAL) BY MOUTH DAILY. FOR ANXIETY AND DEPRESSION. 90 tablet 0   tamsulosin (FLOMAX) 0.4 MG CAPS capsule TAKE 1 CAPSULE (0.4 MG TOTAL) BY MOUTH 2 (TWO) TIMES DAILY. 180 capsule 3   TYLENOL 500 MG tablet Take 1,000 mg by mouth daily.     No current facility-administered medications on file prior to visit.    BP 114/62   Pulse 82   Temp (!) 97.2 F (36.2 C) (Temporal)   Ht 5\' 11"  (1.803 m)   Wt 202 lb (91.6 kg)   SpO2 97%   BMI 28.17 kg/m  Objective:   Physical Exam Cardiovascular:     Rate and Rhythm: Normal rate and regular rhythm.  Pulmonary:     Effort: Pulmonary effort is normal.     Breath sounds: Normal breath sounds. No wheezing or rales.  Musculoskeletal:     Cervical back: Neck supple.  Skin:    General: Skin is warm and dry.  Neurological:     Mental Status: He is alert and oriented to person, place, and time.           Assessment & Plan:  Anxiety and depression Assessment & Plan: Deteriorated.  He has failed 5 agents for management of irritability/anxiety/depression. Recommended psychiatry evaluation for further  treatment options.  He agrees.  Referral  placed.  Orders: -     Ambulatory referral to Psychiatry  Upper GI bleed Assessment & Plan: No alarm signs today. Recent hospitalization.  Hospital notes, labs, imaging reviewed.  Repeat CBC pending.  Add iron studies.  Discussed to pick up the prescription for pantoprazole from the pharmacy and start taking it twice daily.  Phone number provided to patient today to set up GI hospital follow-up.  Orders: -     CBC -     IBC + Ferritin -     Basic metabolic panel  Duodenal ulcer Assessment & Plan: Discussed to start pantoprazole 40 mg twice daily as prescribed. He will pick up from the pharmacy today.  He will schedule a follow-up with GI, phone number provided today. Repeat CBC pending.         [name], NP

## 2023-01-04 ENCOUNTER — Other Ambulatory Visit: Payer: Self-pay | Admitting: Primary Care

## 2023-01-04 DIAGNOSIS — F419 Anxiety disorder, unspecified: Secondary | ICD-10-CM

## 2023-01-07 ENCOUNTER — Other Ambulatory Visit: Payer: Self-pay | Admitting: Primary Care

## 2023-01-07 DIAGNOSIS — E785 Hyperlipidemia, unspecified: Secondary | ICD-10-CM

## 2023-01-11 ENCOUNTER — Emergency Department (HOSPITAL_COMMUNITY)
Admission: EM | Admit: 2023-01-11 | Discharge: 2023-01-12 | Disposition: A | Discharge status: Home / Self Care | Payer: 59 | Attending: Emergency Medicine | Admitting: Emergency Medicine

## 2023-01-11 ENCOUNTER — Other Ambulatory Visit: Payer: Self-pay

## 2023-01-11 ENCOUNTER — Emergency Department (HOSPITAL_COMMUNITY): Payer: 59

## 2023-01-11 DIAGNOSIS — I499 Cardiac arrhythmia, unspecified: Secondary | ICD-10-CM | POA: Diagnosis not present

## 2023-01-11 DIAGNOSIS — Z7982 Long term (current) use of aspirin: Secondary | ICD-10-CM | POA: Diagnosis not present

## 2023-01-11 DIAGNOSIS — J449 Chronic obstructive pulmonary disease, unspecified: Secondary | ICD-10-CM | POA: Insufficient documentation

## 2023-01-11 DIAGNOSIS — R42 Dizziness and giddiness: Secondary | ICD-10-CM | POA: Insufficient documentation

## 2023-01-11 DIAGNOSIS — R58 Hemorrhage, not elsewhere classified: Secondary | ICD-10-CM | POA: Diagnosis not present

## 2023-01-11 DIAGNOSIS — Z743 Need for continuous supervision: Secondary | ICD-10-CM | POA: Diagnosis not present

## 2023-01-11 DIAGNOSIS — Z950 Presence of cardiac pacemaker: Secondary | ICD-10-CM | POA: Insufficient documentation

## 2023-01-11 DIAGNOSIS — E119 Type 2 diabetes mellitus without complications: Secondary | ICD-10-CM | POA: Insufficient documentation

## 2023-01-11 DIAGNOSIS — I129 Hypertensive chronic kidney disease with stage 1 through stage 4 chronic kidney disease, or unspecified chronic kidney disease: Secondary | ICD-10-CM | POA: Diagnosis not present

## 2023-01-11 DIAGNOSIS — I2510 Atherosclerotic heart disease of native coronary artery without angina pectoris: Secondary | ICD-10-CM | POA: Diagnosis not present

## 2023-01-11 DIAGNOSIS — N183 Chronic kidney disease, stage 3 unspecified: Secondary | ICD-10-CM | POA: Diagnosis not present

## 2023-01-11 DIAGNOSIS — Z7984 Long term (current) use of oral hypoglycemic drugs: Secondary | ICD-10-CM | POA: Diagnosis not present

## 2023-01-11 DIAGNOSIS — G9389 Other specified disorders of brain: Secondary | ICD-10-CM | POA: Diagnosis not present

## 2023-01-11 DIAGNOSIS — R0602 Shortness of breath: Secondary | ICD-10-CM | POA: Diagnosis not present

## 2023-01-11 DIAGNOSIS — R6889 Other general symptoms and signs: Secondary | ICD-10-CM | POA: Diagnosis not present

## 2023-01-11 DIAGNOSIS — J439 Emphysema, unspecified: Secondary | ICD-10-CM | POA: Diagnosis not present

## 2023-01-11 DIAGNOSIS — K8020 Calculus of gallbladder without cholecystitis without obstruction: Secondary | ICD-10-CM | POA: Diagnosis not present

## 2023-01-11 DIAGNOSIS — R404 Transient alteration of awareness: Secondary | ICD-10-CM | POA: Diagnosis not present

## 2023-01-11 DIAGNOSIS — I6782 Cerebral ischemia: Secondary | ICD-10-CM | POA: Diagnosis not present

## 2023-01-11 LAB — CBC WITH DIFFERENTIAL/PLATELET
Abs Immature Granulocytes: 0.01 10*3/uL (ref 0.00–0.07)
Basophils Absolute: 0 10*3/uL (ref 0.0–0.1)
Basophils Relative: 0 %
Eosinophils Absolute: 0.3 10*3/uL (ref 0.0–0.5)
Eosinophils Relative: 7 %
HCT: 30.1 % — ABNORMAL LOW (ref 39.0–52.0)
Hemoglobin: 9 g/dL — ABNORMAL LOW (ref 13.0–17.0)
Immature Granulocytes: 0 %
Lymphocytes Relative: 26 %
Lymphs Abs: 1.2 10*3/uL (ref 0.7–4.0)
MCH: 29.3 pg (ref 26.0–34.0)
MCHC: 29.9 g/dL — ABNORMAL LOW (ref 30.0–36.0)
MCV: 98 fL (ref 80.0–100.0)
Monocytes Absolute: 0.4 10*3/uL (ref 0.1–1.0)
Monocytes Relative: 9 %
Neutro Abs: 2.6 10*3/uL (ref 1.7–7.7)
Neutrophils Relative %: 58 %
Platelets: 172 10*3/uL (ref 150–400)
RBC: 3.07 MIL/uL — ABNORMAL LOW (ref 4.22–5.81)
RDW: 15.2 % (ref 11.5–15.5)
WBC: 4.5 10*3/uL (ref 4.0–10.5)
nRBC: 0 % (ref 0.0–0.2)

## 2023-01-11 LAB — COMPREHENSIVE METABOLIC PANEL
ALT: 17 U/L (ref 0–44)
AST: 21 U/L (ref 15–41)
Albumin: 3.6 g/dL (ref 3.5–5.0)
Alkaline Phosphatase: 52 U/L (ref 38–126)
Anion gap: 8 (ref 5–15)
BUN: 20 mg/dL (ref 8–23)
CO2: 19 mmol/L — ABNORMAL LOW (ref 22–32)
Calcium: 8.7 mg/dL — ABNORMAL LOW (ref 8.9–10.3)
Chloride: 109 mmol/L (ref 98–111)
Creatinine, Ser: 1.16 mg/dL (ref 0.61–1.24)
GFR, Estimated: >60 mL/min (ref >60)
Glucose, Bld: 140 mg/dL — ABNORMAL HIGH (ref 70–99)
Potassium: 4.1 mmol/L (ref 3.5–5.1)
Sodium: 136 mmol/L (ref 135–145)
Total Bilirubin: 0.5 mg/dL (ref 0.3–1.2)
Total Protein: 5.7 g/dL — ABNORMAL LOW (ref 6.5–8.1)

## 2023-01-11 NOTE — ED Provider Notes (Signed)
[facility] AT [facility] Provider Note   CSN: [id number] Arrival date & time: 01/11/23  1946     History  Chief Complaint  Patient presents with   Dizziness    [name] Cook is a 72 y.o. male.  Patient with past medical history significant for GERD, hypertension, type II DM, atrial flutter, COPD, CKD stage II, recent admission due to acute upper GI bleed presents to the emergency room complaining of dizziness.  Patient describes this dizziness as feeling lightheaded or slightly off balance.  He denies the sensation of the room spinning around him.  He states he has felt this way since his discharge from the hospital.  He has seen no improvement since leaving the hospital.  He does state that walking and standing seem to make the symptoms worse and they subside when he is sitting.  He denies abdominal pain, nausea, vomiting, chest pain, shortness of breath.  He denies any further episodes of hematemesis and denies any blood in his stool. He denies headaches, urinary symptoms, fevers.   HPI     Home Medications Prior to Admission medications   Medication Sig Start Date End Date Taking? Authorizing Provider  albuterol (VENTOLIN HFA) 108 (90 Base) MCG/ACT inhaler INHALE 2 PUFFS BY MOUTH EVERY 4 HOURS AS NEEDED FOR WHEEZE OR FOR SHORTNESS OF BREATH 08/29/22   [name], NP  Ascorbic Acid (VITAMIN C) 1000 MG tablet Take 2,000 mg by mouth daily.    [provider]  aspirin EC 81 MG tablet Take 1 tablet (81 mg total) by mouth daily. Swallow whole. 12/27/22   Glade Lloyd, MD  blood glucose meter kit and supplies KIT Dispense based on patient and insurance preference. Use up to four times daily as directed. (FOR ICD-9 250.00, 250.01). 04/08/21   [name], NP  budesonide-formoterol (SYMBICORT) 160-4.5 MCG/ACT inhaler INHALE 2 PUFFS INTO THE LUNGS TWICE A DAY Patient taking differently: Inhale 2 puffs into the lungs daily as needed (for  shortness of breath and wheezing). 01/12/21   [name], NP  fluticasone (FLONASE) 50 MCG/ACT nasal spray PLACE 1 SPRAY INTO BOTH NOSTRILS DAILY AS NEEDED FOR ALLERGIES OR RHINITIS. 11/27/22   [name], NP  gabapentin (NEURONTIN) 300 MG capsule TAKE 1 CAPSULE BY MOUTH EVERY MORNING AND 2 CAPSULES BY MOUTH AT NIGHT FOR NEUROPATHY 11/28/22   [name], NP  glipiZIDE (GLUCOTROL XL) 10 MG 24 hr tablet TAKE 1 TABLET (10 MG TOTAL) BY MOUTH DAILY WITH BREAKFAST. FOR DIABETES. 11/29/22   [name], NP  glucose blood (ACCU-CHEK GUIDE) test strip TEST FOUR TIMES A DAY 09/04/22   [name], NP  isosorbide mononitrate (IMDUR) 60 MG 24 hr tablet Take 1 tablet (60 mg total) by mouth daily. 12/20/22   Janetta Hora, PA-C  JARDIANCE 25 MG TABS tablet TAKE 1 TABLET (25 MG TOTAL) BY MOUTH DAILY BEFORE BREAKFAST. FOR DIABETES. 06/15/22   [name], NP  nitroGLYCERIN (NITROSTAT) 0.4 MG SL tablet Place 1 tablet (0.4 mg total) under the tongue every 5 (five) minutes as needed for chest pain. Do not exceed 3 tablets in 24 hours. 05/09/22   [name], NP  pantoprazole (PROTONIX) 40 MG tablet Take 1 tablet (40 mg total) by mouth 2 (two) times daily before a meal. For ulcer 01/02/23   [name], NP  rosuvastatin (CRESTOR) 20 MG tablet TAKE 1 TABLET BY MOUTH EVERY DAY IN THE EVENING FOR  CHOLESTEROL 01/07/23   [name], NP  sertraline (ZOLOFT) 100 MG tablet TAKE 1 TABLET (100 MG TOTAL) BY MOUTH DAILY. FOR ANXIETY AND DEPRESSION. 01/04/23   [name], NP  tamsulosin (FLOMAX) 0.4 MG CAPS capsule TAKE 1 CAPSULE (0.4 MG TOTAL) BY MOUTH 2 (TWO) TIMES DAILY. 01/12/21   Carmina Miller, MD  TYLENOL 500 MG tablet Take 1,000 mg by mouth daily.    [provider]      Allergies    Januvia [sitagliptin] and Metformin and related    Review of Systems   Review of Systems  Physical Exam Updated Vital Signs BP 133/75   Pulse 83   Temp (!)  97.1 F (36.2 C) (Oral)   Resp 15   SpO2 100%  Physical Exam Vitals and nursing note reviewed.  Constitutional:      General: He is not in acute distress.    Appearance: He is well-developed.  HENT:     Head: Normocephalic and atraumatic.  Eyes:     Conjunctiva/sclera: Conjunctivae normal.  Cardiovascular:     Rate and Rhythm: Normal rate and regular rhythm.  Pulmonary:     Effort: Pulmonary effort is normal. No respiratory distress.     Breath sounds: Normal breath sounds.  Abdominal:     Palpations: Abdomen is soft.     Tenderness: There is no abdominal tenderness.  Musculoskeletal:        General: No swelling.     Cervical back: Neck supple.  Skin:    General: Skin is warm and dry.     Capillary Refill: Capillary refill takes less than 2 seconds.  Neurological:     General: No focal deficit present.     Mental Status: He is alert.  Psychiatric:        Mood and Affect: Mood normal.     ED Results / Procedures / Treatments   Labs (all labs ordered are listed, but only abnormal results are displayed) Labs Reviewed  COMPREHENSIVE METABOLIC PANEL - Abnormal; Notable for the following components:      Result Value   CO2 19 (*)    Glucose, Bld 140 (*)    Calcium 8.7 (*)    Total Protein 5.7 (*)    All other components within normal limits  CBC WITH DIFFERENTIAL/PLATELET - Abnormal; Notable for the following components:   RBC 3.07 (*)    Hemoglobin 9.0 (*)    HCT 30.1 (*)    MCHC 29.9 (*)    All other components within normal limits  URINALYSIS, ROUTINE W REFLEX MICROSCOPIC - Abnormal; Notable for the following components:   APPearance HAZY (*)    Glucose, UA >=500 (*)    All other components within normal limits    EKG None  Radiology CT Angio Chest PE W and/or Wo Contrast  Result Date: 01/12/2023 CLINICAL DATA:  Shortness of breath EXAM: CT ANGIOGRAPHY CHEST WITH CONTRAST TECHNIQUE: Multidetector CT imaging of the chest was performed using the standard  protocol during bolus administration of intravenous contrast. Multiplanar CT image reconstructions and MIPs were obtained to evaluate the vascular anatomy. RADIATION DOSE REDUCTION: This exam was performed according to the departmental dose-optimization program which includes automated exposure control, adjustment of the mA and/or kV according to patient size and/or use of iterative reconstruction technique. CONTRAST:  75mL OMNIPAQUE IOHEXOL 350 MG/ML SOLN COMPARISON:  Radiographs 11/06/2022 CT chest 05/08/2022 FINDINGS: Cardiovascular: Negative for acute pulmonary embolism. No pericardial effusion. Left atrial appendage occlusion device. Coronary artery  and aortic atherosclerotic calcification. Left chest wall pacemaker. Mediastinum/Nodes: Trachea and esophagus are unremarkable. No thoracic adenopathy. Lungs/Pleura: Mild emphysema in the apices. Mild diffuse bronchial wall thickening. No focal consolidation, pleural effusion, or pneumothorax. Upper Abdomen: Cholelithiasis without evidence of cholecystitis. No acute abnormality. Musculoskeletal: No acute fracture. Review of the MIP images confirms the above findings. IMPRESSION: 1. Negative for acute pulmonary embolism. 2. No acute abnormality chest. Aortic Atherosclerosis (ICD10-I70.0) and Emphysema (ICD10-J43.9). Electronically Signed   By: Minerva Fester M.D.   On: 01/12/2023 03:09   CT Head Wo Contrast  Result Date: 01/12/2023 CLINICAL DATA:  Dizziness. EXAM: CT HEAD WITHOUT CONTRAST TECHNIQUE: Contiguous axial images were obtained from the base of the skull through the vertex without intravenous contrast. RADIATION DOSE REDUCTION: This exam was performed according to the departmental dose-optimization program which includes automated exposure control, adjustment of the mA and/or kV according to patient size and/or use of iterative reconstruction technique. COMPARISON:  None Available. FINDINGS: Brain: There is mild cerebral atrophy with widening of the  extra-axial spaces and ventricular dilatation. There are areas of decreased attenuation within the white matter tracts of the supratentorial brain, consistent with microvascular disease changes. Vascular: No hyperdense vessel or unexpected calcification. Skull: Normal. Negative for fracture or focal lesion. Sinuses/Orbits: No acute finding. Other: None. IMPRESSION: 1. Generalized cerebral atrophy with chronic white matter small vessel ischemic changes. 2. No acute intracranial abnormality. Electronically Signed   By: Aram Candela M.D.   On: 01/12/2023 00:33    Procedures Procedures    Medications Ordered in ED Medications  iohexol (OMNIPAQUE) 350 MG/ML injection 75 mL (75 mLs Intravenous Contrast Given 01/12/23 0113)    ED Course/ Medical Decision Making/ A&P                                 Medical Decision Making Amount and/or Complexity of Data Reviewed Radiology: ordered.  Risk Prescription drug management.   This patient presents to the ED for concern of lightheadedness, this involves an extensive number of treatment options, and is a complaint that carries with it a high risk of complications and morbidity.  The differential diagnosis includes dysrhythmia, intracranial abnormality, pulmonary embolism, symptomatic anemia, dehydration, others   Co morbidities that complicate the patient evaluation  History of upper GI bleed   Additional history obtained:  Additional history obtained from EMS External records from outside source obtained and reviewed including recent discharge summary and previous lab results.  Hemoglobin at time of discharge was 9.0, hemoglobin 10 days ago at outpatient visit was 10.5   Lab Tests:  I Ordered, and personally interpreted labs.  The pertinent results include: Hemoglobin 9.0, UA with glucose, grossly unremarkable CMP   Imaging Studies ordered:  I ordered imaging studies including CT head without contrast, CT angio chest PE study I  independently visualized and interpreted imaging which showed  1. Generalized cerebral atrophy with chronic white matter small  vessel ischemic changes.  2. No acute intracranial abnormality.   1. Negative for acute pulmonary embolism.  2. No acute abnormality chest   I agree with the radiologist interpretation   Cardiac Monitoring: / EKG:  The patient was maintained on a cardiac monitor.  I personally viewed and interpreted the cardiac monitored which showed an underlying rhythm of: Ventricular paced rhythm   Test / Admission - Considered:  Patient with persistent lightheadedness after discharge from recent GI bleed.  Hemoglobin appears to be stable  at 9.0 today.  He was discharged with a 9.0 hemoglobin, had an outpatient test showing 10.5, but is 9.0 today.  No signs of acute bleeding at this time.  Denies hematemesis, bright red blood per rectum, and melena.  CT PE study negative for any acute abnormalities.  CT head negative for any acute abnormalities. Symptoms have been consistent since recent discharge. I feel the patient may be feeling symptomatic from his blood loss anemia.  Hemoglobin not low enough tonight to consider blood transfusion.  He was able to ambulate in the hallway without losing his balance and without assistance.  Recommend the patient follow-up with primary care for further evaluation and management.         Final Clinical Impression(s) / ED Diagnoses Final diagnoses:  Lightheadedness    Rx / DC Orders ED Discharge Orders     None         Pamala Duffel 01/12/23 0408    Tilden Fossa, MD 01/13/23 [phone]

## 2023-01-11 NOTE — ED Triage Notes (Signed)
Patient arrived with EMS from home reports intermittent dizziness for 2 weeks , CBG=222 .

## 2023-01-11 NOTE — ED Provider Triage Note (Signed)
Emergency Medicine Provider Triage Evaluation Note  [name] Cook , a 72 y.o. male  was evaluated in triage.  Pt complains of weakness.  Review of Systems  Positive: Generalized weakness, difficulty with urination Negative: Fever, shortness of breath, chest pain  Physical Exam  BP 110/68   Pulse 72   Temp 97.7 F (36.5 C)   Resp 18   SpO2 100%  Gen:   Awake, no distress   Resp:  Normal effort  MSK:   Moves extremities without difficulty  Other:    Medical Decision Making  Medically screening exam initiated at 8:18 PM.  Appropriate orders placed.  ROGERICK SCHWENT was informed that the remainder of the evaluation will be completed by another provider, this initial triage assessment does not replace that evaluation, and the importance of remaining in the ED until their evaluation is complete.  Recent hospitalization for ulcer, initially felt better and now is having increased weakness.   Smitty Knudsen, PA-C 01/11/23 2019

## 2023-01-12 ENCOUNTER — Emergency Department (HOSPITAL_COMMUNITY): Payer: 59

## 2023-01-12 DIAGNOSIS — I2510 Atherosclerotic heart disease of native coronary artery without angina pectoris: Secondary | ICD-10-CM | POA: Diagnosis not present

## 2023-01-12 DIAGNOSIS — J439 Emphysema, unspecified: Secondary | ICD-10-CM | POA: Diagnosis not present

## 2023-01-12 DIAGNOSIS — R0602 Shortness of breath: Secondary | ICD-10-CM | POA: Diagnosis not present

## 2023-01-12 DIAGNOSIS — K8020 Calculus of gallbladder without cholecystitis without obstruction: Secondary | ICD-10-CM | POA: Diagnosis not present

## 2023-01-12 LAB — URINALYSIS, ROUTINE W REFLEX MICROSCOPIC
Bacteria, UA: NONE SEEN
Bilirubin Urine: NEGATIVE
Glucose, UA: >=500 mg/dL — AB
Hgb urine dipstick: NEGATIVE
Ketones, ur: NEGATIVE mg/dL
Leukocytes,Ua: NEGATIVE
Nitrite: NEGATIVE
Protein, ur: NEGATIVE mg/dL
Specific Gravity, Urine: 1.018 (ref 1.005–1.030)
pH: 5 (ref 5.0–8.0)

## 2023-01-12 MED ORDER — IOHEXOL 350 MG/ML SOLN
75.0000 mL | Freq: Once | INTRAVENOUS | Status: AC | PRN
  Administered 2023-01-12: 75 mL via INTRAVENOUS

## 2023-01-12 NOTE — ED Notes (Signed)
Pt asked to call his ride. States he is unsure if she will come to get him bc of the weather.

## 2023-01-12 NOTE — ED Notes (Signed)
Pt desires to wait for his ride in waiting room.

## 2023-01-12 NOTE — ED Notes (Signed)
Patient transported to CT 

## 2023-01-12 NOTE — ED Notes (Signed)
Pt ambulated with monitor. Vitals remained stable.

## 2023-01-12 NOTE — Discharge Instructions (Signed)
You were evaluated tonight for dizziness/lightheadedness.  Your workup was reassuring.  Please follow-up with your primary care provider for further evaluation and management as needed.  If you develop any life-threatening symptoms please return to the emergency department.

## 2023-01-22 ENCOUNTER — Other Ambulatory Visit: Payer: 59

## 2023-01-22 DIAGNOSIS — R310 Gross hematuria: Secondary | ICD-10-CM

## 2023-01-22 DIAGNOSIS — N138 Other obstructive and reflux uropathy: Secondary | ICD-10-CM | POA: Diagnosis not present

## 2023-01-22 DIAGNOSIS — N401 Enlarged prostate with lower urinary tract symptoms: Secondary | ICD-10-CM

## 2023-01-22 DIAGNOSIS — C61 Malignant neoplasm of prostate: Secondary | ICD-10-CM

## 2023-01-23 LAB — PSA: Prostate Specific Ag, Serum: <0.1 ng/mL (ref 0.0–4.0)

## 2023-01-24 NOTE — Progress Notes (Deleted)
01/25/2023 10:59 AM   [name] Cook [birth year] [id number]  Referring provider: [name], NP [address] [place],  [place] [address]  Urological history: 1. ED -contributing factors of age, CAD, sleep apnea, COPD, history of smoking, HTN, HLD and diabetes - not a candidate for PDE5i's secondary due nitrate use  2. Prostate cancer -PSA (01/2023) <0.1 -iPSA 6.10 -prostate biopsy (2020) - Prostate biopsy revealed 6/12 cores, all on the right including 3 cores of Gleason 3+3 (up to 99%) and 3 cores of 3+4 up to 96%.  High PSA 6.1.  Rectal exam was abnormal with a nodule at the right apex and firm on the side.  TRUS vol 50 with small median lobe, irregular on right. -cT2 prostate cancer, high-volume intermediate risk involving right-sided gland only -treatment with EBRT and short-term Lupron.  He received his dose in 06/2018.  He completed radiation in 08/2018.  3. BPH w/LU TS -cysto (07/2022) -enlarged prostate with vascular changes consistent with radiation and a tiny median lobe -Tamsulosin 0.4 mg daily  4. High risk hematuria -former smoker -CTU (06/2022) - no worrisome GU findings -cysto (07/2022) -enlarged prostate with vascular changes consistent with radiation  No chief complaint on file.   HPI: [name] Cook is a 72 y.o. male who presents today for 6 month follow up.   Previous records reviewed.   He was recently hospitalized for upper GI bleed       Score:  1-7 Mild 8-19 Moderate 20-35 Severe   PMH: Past Medical History:  Diagnosis Date   Acute bronchitis with COPD (HCC) 03/07/2021   Acute pulmonary embolism (HCC) 03/08/2022   Arthritis    "knees and lower back" (03/14/2013)   Atrial flutter (HCC)    radiofrequency ablation in 2001   CAD (coronary artery disease)    a. Nonobstructive. Cardiac cath in 2001-50% mid RI, normal LM, LAD, RCA b. cath 10/16/2014 95% mid RCA treated with DES, 99% ost D1 medical management due to small aneurysmal segment    Chronic anticoagulation    chronic Coumadin anticoagulation   Chronic obstructive pulmonary disease (HCC) 04/20/2011   Diabetes mellitus, type 2 (HCC)    Diaphoresis 10/26/2021   Elevated lipase 06/23/2021   GERD (gastroesophageal reflux disease)    GIB (gastrointestinal bleeding) 03/08/2022   Hyperlipidemia    Hypertension    with hypertensive heart disease   Left knee pain 10/25/2017   medial   Obesity    Persistent atrial fibrillation (HCC)    recurrent atrial flutter since 2001 s/p DCCVs, multiple failed AADs, h/o tachy-mediated cardiomyopathy   Presence of Watchman left atrial appendage closure device 06/22/2022   Watchman  31mm FLX placed with Dr. Excell Seltzer   Shortness of breath    "can come on at any time" (03/14/2013)   Sleep apnea    "dx'd; couldn't wear the mask" (03/14/2013)   Symptomatic anemia 03/08/2022   Tobacco abuse    Urinary tract infection without hematuria 05/26/2022    Surgical History: Past Surgical History:  Procedure Laterality Date   ATRIAL FLUTTER ABLATION  2002   atrial flutter; subsequently developed atrial fibrillation   AV NODE ABLATION  01/24/2013   CARDIAC CATHETERIZATION  2002   CARDIAC CATHETERIZATION N/A 10/16/2014   Procedure: Left Heart Cath and Coronary Angiography;  Surgeon: Tonny Bollman, MD;  Location: [facility] INVASIVE CV LAB;  Service: Cardiovascular;  Laterality: N/A;   CARDIOVERSION  05/31/2011   Procedure: CARDIOVERSION;  Surgeon: [name]. [name], MD;  Location: [facility] ORS;  Service: Cardiovascular;  Laterality: N/A;   CARPAL TUNNEL RELEASE Left 1980's   COLONOSCOPY WITH PROPOFOL N/A 12/30/2018   Procedure: COLONOSCOPY WITH PROPOFOL;  Surgeon: Wyline Mood, MD;  Location: [facility] ENDOSCOPY;  Service: Gastroenterology;  Laterality: N/A;   COLONOSCOPY WITH PROPOFOL N/A 12/04/2019   Procedure: COLONOSCOPY WITH PROPOFOL;  Surgeon: Wyline Mood, MD;  Location: [facility] ENDOSCOPY;  Service: Gastroenterology;  Laterality: N/A;   COLONOSCOPY WITH PROPOFOL  N/A 06/13/2021   Procedure: COLONOSCOPY WITH PROPOFOL;  Surgeon: Wyline Mood, MD;  Location: [facility] ENDOSCOPY;  Service: Gastroenterology;  Laterality: N/A;   ESOPHAGOGASTRODUODENOSCOPY N/A 03/01/2022   Procedure: ESOPHAGOGASTRODUODENOSCOPY (EGD);  Surgeon: Vida Rigger, MD;  Location: [facility] ENDOSCOPY;  Service: Gastroenterology;  Laterality: N/A;   ESOPHAGOGASTRODUODENOSCOPY (EGD) WITH PROPOFOL N/A 12/21/2022   Procedure: ESOPHAGOGASTRODUODENOSCOPY (EGD) WITH PROPOFOL;  Surgeon: Benancio Deeds, MD;  Location: [facility] ENDOSCOPY;  Service: Gastroenterology;  Laterality: N/A;   ESOPHAGOGASTRODUODENOSCOPY (EGD) WITH PROPOFOL N/A 12/23/2022   Procedure: ESOPHAGOGASTRODUODENOSCOPY (EGD) WITH PROPOFOL;  Surgeon: Benancio Deeds, MD;  Location: [facility] ENDOSCOPY;  Service: Gastroenterology;  Laterality: N/A;   GIVENS CAPSULE STUDY Left 03/10/2022   Procedure: GIVENS CAPSULE STUDY;  Surgeon: Willis Modena, MD;  Location: [facility] ENDOSCOPY;  Service: Gastroenterology;  Laterality: Left;   HEMOSTASIS CLIP PLACEMENT  12/23/2022   Procedure: HEMOSTASIS CLIP PLACEMENT;  Surgeon: Benancio Deeds, MD;  Location: [facility] ENDOSCOPY;  Service: Gastroenterology;;   HEMOSTASIS CONTROL  12/21/2022   Procedure: HEMOSTASIS CONTROL;  Surgeon: Benancio Deeds, MD;  Location: [facility] ENDOSCOPY;  Service: Gastroenterology;;   INSERT / REPLACE / REMOVE PACEMAKER  01/24/2013    Medtronic Adapta L dual-chamber pacemaker, serial number [id number]    LEFT ATRIAL APPENDAGE OCCLUSION N/A 06/22/2022   Procedure: LEFT ATRIAL APPENDAGE OCCLUSION;  Surgeon: Tonny Bollman, MD;  Location: [facility] INVASIVE CV LAB;  Service: Cardiovascular;  Laterality: N/A;   LEFT HEART CATH AND CORONARY ANGIOGRAPHY N/A 06/07/2018   Procedure: LEFT HEART CATH AND CORONARY ANGIOGRAPHY;  Surgeon: Tonny Bollman, MD;  Location: [facility] INVASIVE CV LAB;  Service: Cardiovascular;  Laterality: N/A;   LEFT HEART CATHETERIZATION WITH CORONARY ANGIOGRAM N/A 03/17/2013   Procedure: LEFT  HEART CATHETERIZATION WITH CORONARY ANGIOGRAM;  Surgeon: Kathleene Hazel, MD; LAD mild dz, D1 branch 100%, inferior branch 99%, CFX OK, RCA 50%, EF 65%     LOOP RECORDER IMPLANT  2002   PERMANENT PACEMAKER INSERTION N/A 01/24/2013   Procedure: PERMANENT PACEMAKER INSERTION;  Surgeon: Marinus Maw, MD;  Location: [facility] CATH LAB;  Service: Cardiovascular;  Laterality: N/A;   TEE WITHOUT CARDIOVERSION N/A 06/22/2022   Procedure: TRANSESOPHAGEAL ECHOCARDIOGRAM;  Surgeon: Tonny Bollman, MD;  Location: [facility] INVASIVE CV LAB;  Service: Cardiovascular;  Laterality: N/A;   TIBIAL TUBERCLERPLASTY  ~ 2003    Home Medications:  Allergies as of 01/25/2023       Reactions   Januvia [sitagliptin] Other (See Comments)   Elevated lipase   Metformin And Related Diarrhea        Medication List        Accurate as of January 24, 2023 10:59 AM. If you have any questions, ask your nurse or doctor.          Accu-Chek Guide test strip Generic drug: glucose blood TEST FOUR TIMES A DAY   albuterol 108 (90 Base) MCG/ACT inhaler Commonly known as: VENTOLIN HFA INHALE 2 PUFFS BY MOUTH EVERY 4 HOURS AS NEEDED FOR WHEEZE OR FOR SHORTNESS OF BREATH   aspirin EC 81 MG tablet  Take 1 tablet (81 mg total) by mouth daily. Swallow whole.   blood glucose meter kit and supplies Kit Dispense based on patient and insurance preference. Use up to four times daily as directed. (FOR ICD-9 250.00, 250.01).   fluticasone 50 MCG/ACT nasal spray Commonly known as: FLONASE PLACE 1 SPRAY INTO BOTH NOSTRILS DAILY AS NEEDED FOR ALLERGIES OR RHINITIS.   gabapentin 300 MG capsule Commonly known as: NEURONTIN TAKE 1 CAPSULE BY MOUTH EVERY MORNING AND 2 CAPSULES BY MOUTH AT NIGHT FOR NEUROPATHY   glipiZIDE 10 MG 24 hr tablet Commonly known as: GLUCOTROL XL TAKE 1 TABLET (10 MG TOTAL) BY MOUTH DAILY WITH BREAKFAST. FOR DIABETES.   isosorbide mononitrate 60 MG 24 hr tablet Commonly known as: IMDUR Take 1 tablet (60  mg total) by mouth daily.   Jardiance 25 MG Tabs tablet Generic drug: empagliflozin TAKE 1 TABLET (25 MG TOTAL) BY MOUTH DAILY BEFORE BREAKFAST. FOR DIABETES.   nitroGLYCERIN 0.4 MG SL tablet Commonly known as: NITROSTAT Place 1 tablet (0.4 mg total) under the tongue every 5 (five) minutes as needed for chest pain. Do not exceed 3 tablets in 24 hours.   pantoprazole 40 MG tablet Commonly known as: PROTONIX Take 1 tablet (40 mg total) by mouth 2 (two) times daily before a meal. For ulcer   rosuvastatin 20 MG tablet Commonly known as: CRESTOR TAKE 1 TABLET BY MOUTH EVERY DAY IN THE EVENING FOR CHOLESTEROL   sertraline 100 MG tablet Commonly known as: ZOLOFT TAKE 1 TABLET (100 MG TOTAL) BY MOUTH DAILY. FOR ANXIETY AND DEPRESSION.   Symbicort 160-4.5 MCG/ACT inhaler Generic drug: budesonide-formoterol INHALE 2 PUFFS INTO THE LUNGS TWICE A DAY What changed: See the new instructions.   tamsulosin 0.4 MG Caps capsule Commonly known as: FLOMAX TAKE 1 CAPSULE (0.4 MG TOTAL) BY MOUTH 2 (TWO) TIMES DAILY.   TYLENOL 500 MG tablet Generic drug: acetaminophen Take 1,000 mg by mouth daily.   vitamin C 1000 MG tablet Take 2,000 mg by mouth daily.        Allergies:  Allergies  Allergen Reactions   Januvia [Sitagliptin] Other (See Comments)    Elevated lipase   Metformin And Related Diarrhea    Family History: Family History  Problem Relation Age of Onset   Alzheimer's disease Mother    Osteoporosis Mother     Social History:  reports that he quit smoking about 9 years ago. His smoking use included cigarettes. He started smoking about 51 years ago. He has a 42 pack-year smoking history. He has never used smokeless tobacco. He reports that he does not currently use alcohol. He reports that he does not use drugs.  ROS: Pertinent ROS in HPI  Physical Exam: There were no vitals taken for this visit.  Constitutional:  Well nourished. Alert and oriented, No acute  distress. HEENT: [place] AT, moist mucus membranes.  Trachea midline, no masses. Cardiovascular: No clubbing, cyanosis, or edema. Respiratory: Normal respiratory effort, no increased work of breathing. GI: Abdomen is soft, non tender, non distended, no abdominal masses. Liver and spleen not palpable.  No hernias appreciated.  Stool sample for occult testing is not indicated.   GU: No CVA tenderness.  No bladder fullness or masses.  Patient with circumcised/uncircumcised phallus. ***Foreskin easily retracted***  Urethral meatus is patent.  No penile discharge. No penile lesions or rashes. Scrotum without lesions, cysts, rashes and/or edema.  Testicles are located scrotally bilaterally. No masses are appreciated in the testicles. Left and right epididymis are  normal. Rectal: Patient with  normal sphincter tone. Anus and perineum without scarring or rashes. No rectal masses are appreciated. Prostate is approximately *** grams, *** nodules are appreciated. Seminal vesicles are normal. Skin: No rashes, bruises or suspicious lesions. Lymph: No cervical or inguinal adenopathy. Neurologic: Grossly intact, no focal deficits, moving all 4 extremities. Psychiatric: Normal mood and affect.   Laboratory Data: Component     Latest Ref Rng 01/22/2023  Prostate Specific Ag, Serum     0.0 - 4.0 ng/mL <0.1    CBC    Component Value Date/Time   WBC 4.5 01/11/2023 2001   RBC 3.07 (L) 01/11/2023 2001   HGB 9.0 (L) 01/11/2023 2001   HGB 11.7 (L) 07/24/2022 0933   HCT 30.1 (L) 01/11/2023 2001   HCT 38.8 07/24/2022 0933   PLT 172 01/11/2023 2001   PLT 133 (L) 07/24/2022 0933   MCV 98.0 01/11/2023 2001   MCV 92 07/24/2022 0933   MCV 92 01/23/2013 0151   MCH 29.3 01/11/2023 2001   MCHC 29.9 (L) 01/11/2023 2001   RDW 15.2 01/11/2023 2001   RDW 17.8 (H) 07/24/2022 0933   RDW 14.0 01/23/2013 0151   LYMPHSABS 1.2 01/11/2023 2001   LYMPHSABS 3.7 (H) 01/23/2013 0151   MONOABS 0.4 01/11/2023 2001   MONOABS 0.7  01/23/2013 0151   EOSABS 0.3 01/11/2023 2001   EOSABS 0.4 01/23/2013 0151   BASOSABS 0.0 01/11/2023 2001   BASOSABS 0.0 01/23/2013 0151    CMP     Component Value Date/Time   NA 136 01/11/2023 2001   NA 145 (H) 07/24/2022 0933   NA 141 01/23/2013 0151   K 4.1 01/11/2023 2001   K 3.4 (L) 01/23/2013 0151   CL 109 01/11/2023 2001   CL 107 01/23/2013 0151   CO2 19 (L) 01/11/2023 2001   CO2 26 01/23/2013 0151   GLUCOSE 140 (H) 01/11/2023 2001   GLUCOSE 97 01/23/2013 0151   BUN 20 01/11/2023 2001   BUN 23 07/24/2022 0933   BUN 23 (H) 01/23/2013 0151   CREATININE 1.16 01/11/2023 2001   CREATININE 1.06 05/27/2021 1515   CALCIUM 8.7 (L) 01/11/2023 2001   CALCIUM 8.4 (L) 01/23/2013 0151   PROT 5.7 (L) 01/11/2023 2001   PROT 6.3 05/28/2019 1202   PROT 6.5 01/23/2013 0151   ALBUMIN 3.6 01/11/2023 2001   ALBUMIN 4.4 05/28/2019 1202   ALBUMIN 3.5 01/23/2013 0151   AST 21 01/11/2023 2001   AST 17 01/23/2013 0151   ALT 17 01/11/2023 2001   ALT 27 01/23/2013 0151   ALKPHOS 52 01/11/2023 2001   ALKPHOS 83 01/23/2013 0151   BILITOT 0.5 01/11/2023 2001   BILITOT 0.4 05/28/2019 1202   BILITOT 0.3 01/23/2013 0151   GFR 69.47 01/02/2023 1220   EGFR 77 07/24/2022 0933   GFRNONAA >60 01/11/2023 2001   GFRNONAA 80 03/28/2017 1433       Urinalysis Component     Latest Ref Rng 01/12/2023  Color, Urine     YELLOW  YELLOW   Appearance     CLEAR  HAZY !   Specific Gravity, Urine     1.005 - 1.030  1.018   pH     5.0 - 8.0  5.0   Glucose, UA     NEGATIVE mg/dL >=259 !   Hgb urine dipstick     NEGATIVE  NEGATIVE   Bilirubin Urine     NEGATIVE  NEGATIVE   Ketones, ur     NEGATIVE mg/dL  NEGATIVE   Protein     NEGATIVE mg/dL NEGATIVE   Nitrite     NEGATIVE  NEGATIVE   Leukocytes,Ua     NEGATIVE  NEGATIVE   WBC, UA     0 - 5 WBC/hpf 0-5   Bacteria, UA     NONE SEEN  NONE SEEN   Squamous Epithelial / HPF     0 - 5 /HPF 0-5   RBC / HPF     0 - 5 RBC/hpf 0-5   RBC, Urine      0 - 2 /hpf   Epithelial Cells (non renal)     0 - 10 /hpf   Casts     None seen /lpf   Specific Gravity, UA     1.005 - 1.030    pH, UA     5.0 - 7.5    Color, UA     Yellow    Appearance Ur     Clear    Leukocytes,UA     Negative    Protein,UA     Negative/Trace    Ketones, UA     Negative    RBC, UA     Negative    Bilirubin, UA     Negative    Urobilinogen, Ur     0.2 - 1.0 mg/dL   Nitrite, UA     Negative    Microscopic Examination   Mucus     Legend: ! Abnormal I have reviewed the labs.   Pertinent Imaging: N/A   Assessment & Plan:    1.  High risk hematuria -Former smoker -Recent workup (07/2022) significant for radiation changes in the prostate -No reports of gross hematuria -Recent UA negative for micro heme  2. ED - insurance would not cover the IPP -His partner has medical issues that he is not sexually active at this time  3. Prostate cancer -PSA undetectable -Continue PSA every 6 months  4. BPH with LUTS -continue conservative management, avoiding bladder irritants and timed voiding's -Continue tamsulosin 0.4 mg daily     No follow-ups on file.  These notes generated with voice recognition software. I apologize for typographical errors.  Cloretta Ned  [facility] [address]  [address] [place], [place] [address] [phone]

## 2023-01-25 ENCOUNTER — Ambulatory Visit: Payer: 59 | Admitting: Urology

## 2023-01-25 DIAGNOSIS — C61 Malignant neoplasm of prostate: Secondary | ICD-10-CM

## 2023-01-25 DIAGNOSIS — N529 Male erectile dysfunction, unspecified: Secondary | ICD-10-CM

## 2023-01-25 DIAGNOSIS — N401 Enlarged prostate with lower urinary tract symptoms: Secondary | ICD-10-CM

## 2023-01-25 DIAGNOSIS — R319 Hematuria, unspecified: Secondary | ICD-10-CM

## 2023-01-29 ENCOUNTER — Ambulatory Visit (INDEPENDENT_AMBULATORY_CARE_PROVIDER_SITE_OTHER): Payer: 59

## 2023-01-29 DIAGNOSIS — I4891 Unspecified atrial fibrillation: Secondary | ICD-10-CM

## 2023-01-29 DIAGNOSIS — I442 Atrioventricular block, complete: Secondary | ICD-10-CM

## 2023-01-30 ENCOUNTER — Other Ambulatory Visit: Payer: Self-pay | Admitting: Primary Care

## 2023-01-30 ENCOUNTER — Other Ambulatory Visit: Payer: Self-pay | Admitting: Internal Medicine

## 2023-01-30 DIAGNOSIS — J309 Allergic rhinitis, unspecified: Secondary | ICD-10-CM

## 2023-01-30 DIAGNOSIS — J439 Emphysema, unspecified: Secondary | ICD-10-CM

## 2023-01-30 DIAGNOSIS — E1165 Type 2 diabetes mellitus with hyperglycemia: Secondary | ICD-10-CM

## 2023-01-30 DIAGNOSIS — R251 Tremor, unspecified: Secondary | ICD-10-CM

## 2023-01-30 LAB — CUP PACEART REMOTE DEVICE CHECK
Battery Impedance: 1546 Ohm
Battery Remaining Longevity: 47 mo
Battery Voltage: 2.77 V
Brady Statistic RV Percent Paced: 100 %
Date Time Interrogation Session: 20241013222136
Implantable Lead Connection Status: 753985
Implantable Lead Connection Status: 753985
Implantable Lead Implant Date: 20141010
Implantable Lead Implant Date: 20141010
Implantable Lead Location: 753859
Implantable Lead Location: 753860
Implantable Lead Model: 5076
Implantable Lead Model: 5076
Implantable Pulse Generator Implant Date: 20141010
Lead Channel Impedance Value: 534 Ohm
Lead Channel Impedance Value: 67 Ohm
Lead Channel Pacing Threshold Amplitude: 0.875 V
Lead Channel Pacing Threshold Pulse Width: 0.4 ms
Lead Channel Setting Pacing Amplitude: 2.5 V
Lead Channel Setting Pacing Pulse Width: 0.4 ms
Lead Channel Setting Sensing Sensitivity: 4 mV
Zone Setting Status: 755011
Zone Setting Status: 755011

## 2023-02-06 NOTE — Progress Notes (Unsigned)
02/07/2023 4:03 PM   [name] Cook [birth year] [id number]  Referring provider: [name], NP [address] [place],  [place] [address]  Urological history: 1. ED -contributing factors of age, CAD, sleep apnea, COPD, history of smoking, HTN, HLD and diabetes - not a candidate for PDE5i's secondary due nitrate use  2. Prostate cancer -PSA (01/2023) <0.1 -iPSA 6.10 -prostate biopsy (2020) - Prostate biopsy revealed 6/12 cores, all on the right including 3 cores of Gleason 3+3 (up to 99%) and 3 cores of 3+4 up to 96%.  High PSA 6.1.  Rectal exam was abnormal with a nodule at the right apex and firm on the side.  TRUS vol 50 with small median lobe, irregular on right. -cT2 prostate cancer, high-volume intermediate risk involving right-sided gland only -treatment with EBRT and short-term Lupron.  He received his dose in 06/2018.  He completed radiation in 08/2018.  3. BPH w/LU TS -cysto (07/2022) -enlarged prostate with vascular changes consistent with radiation and a tiny median lobe -Tamsulosin 0.4 mg daily  4. High risk hematuria -former smoker -CTU (06/2022) - no worrisome GU findings -cysto (07/2022) -enlarged prostate with vascular changes consistent with radiation  Chief Complaint  Patient presents with   Follow-up    HPI: [name] Cook is a 72 y.o. male who presents today for 6 month follow up.   Previous records reviewed.   He was recently hospitalized for upper GI bleed.  I PSS 5/2  PVR 63 mL   He has no urinary complaints.   Patient denies any modifying or aggravating factors.  Patient denies any recent UTI's, gross hematuria, dysuria or suprapubic/flank pain.  Patient denies any fevers, chills, nausea or vomiting.     IPSS     Row Name 02/07/23 1500         International Prostate Symptom Score   How often have you had the sensation of not emptying your bladder? Not at All     How often have you had to urinate less than every two hours? About half the  time     How often have you found you stopped and started again several times when you urinated? Less than 1 in 5 times     How often have you found it difficult to postpone urination? Not at All     How often have you had a weak urinary stream? Not at All     How often have you had to strain to start urination? Not at All     How many times did you typically get up at night to urinate? 1 Time     Total IPSS Score 5       Quality of Life due to urinary symptoms   If you were to spend the rest of your life with your urinary condition just the way it is now how would you feel about that? Mostly Satisfied               Score:  1-7 Mild 8-19 Moderate 20-35 Severe   PMH: Past Medical History:  Diagnosis Date   Acute bronchitis with COPD (HCC) 03/07/2021   Acute pulmonary embolism (HCC) 03/08/2022   Arthritis    "knees and lower back" (03/14/2013)   Atrial flutter (HCC)    radiofrequency ablation in 2001   CAD (coronary artery disease)    a. Nonobstructive. Cardiac cath in 2001-50% mid RI, normal LM, LAD, RCA b. cath 10/16/2014 95% mid RCA treated  with DES, 99% ost D1 medical management due to small aneurysmal segment   Chronic anticoagulation    chronic Coumadin anticoagulation   Chronic obstructive pulmonary disease (HCC) 04/20/2011   Diabetes mellitus, type 2 (HCC)    Diaphoresis 10/26/2021   Elevated lipase 06/23/2021   GERD (gastroesophageal reflux disease)    GIB (gastrointestinal bleeding) 03/08/2022   Hyperlipidemia    Hypertension    with hypertensive heart disease   Left knee pain 10/25/2017   medial   Obesity    Persistent atrial fibrillation (HCC)    recurrent atrial flutter since 2001 s/p DCCVs, multiple failed AADs, h/o tachy-mediated cardiomyopathy   Presence of Watchman left atrial appendage closure device 06/22/2022   Watchman  31mm FLX placed with Dr. Excell Seltzer   Shortness of breath    "can come on at any time" (03/14/2013)   Sleep apnea    "dx'd;  couldn't wear the mask" (03/14/2013)   Symptomatic anemia 03/08/2022   Tobacco abuse    Urinary tract infection without hematuria 05/26/2022    Surgical History: Past Surgical History:  Procedure Laterality Date   ATRIAL FLUTTER ABLATION  2002   atrial flutter; subsequently developed atrial fibrillation   AV NODE ABLATION  01/24/2013   CARDIAC CATHETERIZATION  2002   CARDIAC CATHETERIZATION N/A 10/16/2014   Procedure: Left Heart Cath and Coronary Angiography;  Surgeon: Tonny Bollman, MD;  Location: [facility] INVASIVE CV LAB;  Service: Cardiovascular;  Laterality: N/A;   CARDIOVERSION  05/31/2011   Procedure: CARDIOVERSION;  Surgeon: [name]. [name], MD;  Location: [facility] ORS;  Service: Cardiovascular;  Laterality: N/A;   CARPAL TUNNEL RELEASE Left 1980's   COLONOSCOPY WITH PROPOFOL N/A 12/30/2018   Procedure: COLONOSCOPY WITH PROPOFOL;  Surgeon: Wyline Mood, MD;  Location: [facility] ENDOSCOPY;  Service: Gastroenterology;  Laterality: N/A;   COLONOSCOPY WITH PROPOFOL N/A 12/04/2019   Procedure: COLONOSCOPY WITH PROPOFOL;  Surgeon: Wyline Mood, MD;  Location: [facility] ENDOSCOPY;  Service: Gastroenterology;  Laterality: N/A;   COLONOSCOPY WITH PROPOFOL N/A 06/13/2021   Procedure: COLONOSCOPY WITH PROPOFOL;  Surgeon: Wyline Mood, MD;  Location: [facility] ENDOSCOPY;  Service: Gastroenterology;  Laterality: N/A;   ESOPHAGOGASTRODUODENOSCOPY N/A 03/01/2022   Procedure: ESOPHAGOGASTRODUODENOSCOPY (EGD);  Surgeon: Vida Rigger, MD;  Location: [facility] ENDOSCOPY;  Service: Gastroenterology;  Laterality: N/A;   ESOPHAGOGASTRODUODENOSCOPY (EGD) WITH PROPOFOL N/A 12/21/2022   Procedure: ESOPHAGOGASTRODUODENOSCOPY (EGD) WITH PROPOFOL;  Surgeon: Benancio Deeds, MD;  Location: [facility] ENDOSCOPY;  Service: Gastroenterology;  Laterality: N/A;   ESOPHAGOGASTRODUODENOSCOPY (EGD) WITH PROPOFOL N/A 12/23/2022   Procedure: ESOPHAGOGASTRODUODENOSCOPY (EGD) WITH PROPOFOL;  Surgeon: Benancio Deeds, MD;  Location: [facility] ENDOSCOPY;  Service:  Gastroenterology;  Laterality: N/A;   GIVENS CAPSULE STUDY Left 03/10/2022   Procedure: GIVENS CAPSULE STUDY;  Surgeon: Willis Modena, MD;  Location: [facility] ENDOSCOPY;  Service: Gastroenterology;  Laterality: Left;   HEMOSTASIS CLIP PLACEMENT  12/23/2022   Procedure: HEMOSTASIS CLIP PLACEMENT;  Surgeon: Benancio Deeds, MD;  Location: [facility] ENDOSCOPY;  Service: Gastroenterology;;   HEMOSTASIS CONTROL  12/21/2022   Procedure: HEMOSTASIS CONTROL;  Surgeon: Benancio Deeds, MD;  Location: [facility] ENDOSCOPY;  Service: Gastroenterology;;   INSERT / REPLACE / REMOVE PACEMAKER  01/24/2013    Medtronic Adapta L dual-chamber pacemaker, serial number [id number]    LEFT ATRIAL APPENDAGE OCCLUSION N/A 06/22/2022   Procedure: LEFT ATRIAL APPENDAGE OCCLUSION;  Surgeon: Tonny Bollman, MD;  Location: [facility] INVASIVE CV LAB;  Service: Cardiovascular;  Laterality: N/A;   LEFT HEART CATH AND CORONARY ANGIOGRAPHY N/A 06/07/2018   Procedure: LEFT  HEART CATH AND CORONARY ANGIOGRAPHY;  Surgeon: Tonny Bollman, MD;  Location: [facility] INVASIVE CV LAB;  Service: Cardiovascular;  Laterality: N/A;   LEFT HEART CATHETERIZATION WITH CORONARY ANGIOGRAM N/A 03/17/2013   Procedure: LEFT HEART CATHETERIZATION WITH CORONARY ANGIOGRAM;  Surgeon: Kathleene Hazel, MD; LAD mild dz, D1 branch 100%, inferior branch 99%, CFX OK, RCA 50%, EF 65%     LOOP RECORDER IMPLANT  2002   PERMANENT PACEMAKER INSERTION N/A 01/24/2013   Procedure: PERMANENT PACEMAKER INSERTION;  Surgeon: Marinus Maw, MD;  Location: [facility] CATH LAB;  Service: Cardiovascular;  Laterality: N/A;   TEE WITHOUT CARDIOVERSION N/A 06/22/2022   Procedure: TRANSESOPHAGEAL ECHOCARDIOGRAM;  Surgeon: Tonny Bollman, MD;  Location: [facility] INVASIVE CV LAB;  Service: Cardiovascular;  Laterality: N/A;   TIBIAL TUBERCLERPLASTY  ~ 2003    Home Medications:  Allergies as of 02/07/2023       Reactions   Januvia [sitagliptin] Other (See Comments)   Elevated lipase   Metformin And Related  Diarrhea        Medication List        Accurate as of February 07, 2023  4:03 PM. If you have any questions, ask your nurse or doctor.          Accu-Chek Guide test strip Generic drug: glucose blood TEST FOUR TIMES A DAY   aspirin EC 81 MG tablet Take 1 tablet (81 mg total) by mouth daily. Swallow whole.   blood glucose meter kit and supplies Kit Dispense based on patient and insurance preference. Use up to four times daily as directed. (FOR ICD-9 250.00, 250.01).   clopidogrel 75 MG tablet Commonly known as: PLAVIX Take 75 mg by mouth daily.   fluticasone 50 MCG/ACT nasal spray Commonly known as: FLONASE PLACE 1 SPRAY INTO BOTH NOSTRILS DAILY AS NEEDED FOR ALLERGIES OR RHINITIS.   gabapentin 300 MG capsule Commonly known as: NEURONTIN TAKE 1 CAPSULE BY MOUTH EVERY MORNING AND 2 CAPSULES BY MOUTH AT NIGHT FOR NEUROPATHY   glipiZIDE 10 MG 24 hr tablet Commonly known as: GLUCOTROL XL TAKE 1 TABLET (10 MG TOTAL) BY MOUTH DAILY WITH BREAKFAST. FOR DIABETES.   isosorbide mononitrate 60 MG 24 hr tablet Commonly known as: IMDUR Take 1 tablet (60 mg total) by mouth daily.   Jardiance 25 MG Tabs tablet Generic drug: empagliflozin TAKE 1 TABLET (25 MG TOTAL) BY MOUTH DAILY BEFORE BREAKFAST. FOR DIABETES.   lisinopril 2.5 MG tablet Commonly known as: ZESTRIL Take 2.5 mg by mouth daily.   nitroGLYCERIN 0.4 MG SL tablet Commonly known as: NITROSTAT Place 1 tablet (0.4 mg total) under the tongue every 5 (five) minutes as needed for chest pain. Do not exceed 3 tablets in 24 hours.   pantoprazole 40 MG tablet Commonly known as: PROTONIX Take 1 tablet (40 mg total) by mouth 2 (two) times daily before a meal. For ulcer   rosuvastatin 20 MG tablet Commonly known as: CRESTOR TAKE 1 TABLET BY MOUTH EVERY DAY IN THE EVENING FOR CHOLESTEROL   sertraline 100 MG tablet Commonly known as: ZOLOFT TAKE 1 TABLET (100 MG TOTAL) BY MOUTH DAILY. FOR ANXIETY AND DEPRESSION.    Symbicort 160-4.5 MCG/ACT inhaler Generic drug: budesonide-formoterol INHALE 2 PUFFS INTO THE LUNGS TWICE A DAY What changed: See the new instructions.   tamsulosin 0.4 MG Caps capsule Commonly known as: FLOMAX Take 1 capsule (0.4 mg total) by mouth daily. What changed: when to take this Changed by: Shaana Acocella   TYLENOL 500 MG tablet Generic drug:  acetaminophen Take 1,000 mg by mouth daily.   Ventolin HFA 108 (90 Base) MCG/ACT inhaler Generic drug: albuterol INHALE 2 PUFFS BY MOUTH EVERY 4 HOURS AS NEEDED FOR WHEEZE OR FOR SHORTNESS OF BREATH   vitamin C 1000 MG tablet Take 2,000 mg by mouth daily.        Allergies:  Allergies  Allergen Reactions   Januvia [Sitagliptin] Other (See Comments)    Elevated lipase   Metformin And Related Diarrhea    Family History: Family History  Problem Relation Age of Onset   Alzheimer's disease Mother    Osteoporosis Mother     Social History:  reports that he quit smoking about 9 years ago. His smoking use included cigarettes. He started smoking about 51 years ago. He has a 42 pack-year smoking history. He has never used smokeless tobacco. He reports that he does not currently use alcohol. He reports that he does not use drugs.  ROS: Pertinent ROS in HPI  Physical Exam: BP 125/77   Pulse 89   Constitutional:  Well nourished. Alert and oriented, No acute distress. HEENT: [place] AT, moist mucus membranes.  Trachea midline Cardiovascular: No clubbing, cyanosis, or edema. Respiratory: Normal respiratory effort, no increased work of breathing. Neurologic: Grossly intact, no focal deficits, moving all 4 extremities. Psychiatric: Normal mood and affect.   Laboratory Data: Component     Latest Ref Rng 01/22/2023  Prostate Specific Ag, Serum     0.0 - 4.0 ng/mL <0.1    CBC    Component Value Date/Time   WBC 4.5 01/11/2023 2001   RBC 3.07 (L) 01/11/2023 2001   HGB 9.0 (L) 01/11/2023 2001   HGB 11.7 (L) 07/24/2022 0933    HCT 30.1 (L) 01/11/2023 2001   HCT 38.8 07/24/2022 0933   PLT 172 01/11/2023 2001   PLT 133 (L) 07/24/2022 0933   MCV 98.0 01/11/2023 2001   MCV 92 07/24/2022 0933   MCV 92 01/23/2013 0151   MCH 29.3 01/11/2023 2001   MCHC 29.9 (L) 01/11/2023 2001   RDW 15.2 01/11/2023 2001   RDW 17.8 (H) 07/24/2022 0933   RDW 14.0 01/23/2013 0151   LYMPHSABS 1.2 01/11/2023 2001   LYMPHSABS 3.7 (H) 01/23/2013 0151   MONOABS 0.4 01/11/2023 2001   MONOABS 0.7 01/23/2013 0151   EOSABS 0.3 01/11/2023 2001   EOSABS 0.4 01/23/2013 0151   BASOSABS 0.0 01/11/2023 2001   BASOSABS 0.0 01/23/2013 0151    CMP     Component Value Date/Time   NA 136 01/11/2023 2001   NA 145 (H) 07/24/2022 0933   NA 141 01/23/2013 0151   K 4.1 01/11/2023 2001   K 3.4 (L) 01/23/2013 0151   CL 109 01/11/2023 2001   CL 107 01/23/2013 0151   CO2 19 (L) 01/11/2023 2001   CO2 26 01/23/2013 0151   GLUCOSE 140 (H) 01/11/2023 2001   GLUCOSE 97 01/23/2013 0151   BUN 20 01/11/2023 2001   BUN 23 07/24/2022 0933   BUN 23 (H) 01/23/2013 0151   CREATININE 1.16 01/11/2023 2001   CREATININE 1.06 05/27/2021 1515   CALCIUM 8.7 (L) 01/11/2023 2001   CALCIUM 8.4 (L) 01/23/2013 0151   PROT 5.7 (L) 01/11/2023 2001   PROT 6.3 05/28/2019 1202   PROT 6.5 01/23/2013 0151   ALBUMIN 3.6 01/11/2023 2001   ALBUMIN 4.4 05/28/2019 1202   ALBUMIN 3.5 01/23/2013 0151   AST 21 01/11/2023 2001   AST 17 01/23/2013 0151   ALT 17 01/11/2023 2001  ALT 27 01/23/2013 0151   ALKPHOS 52 01/11/2023 2001   ALKPHOS 83 01/23/2013 0151   BILITOT 0.5 01/11/2023 2001   BILITOT 0.4 05/28/2019 1202   BILITOT 0.3 01/23/2013 0151   GFR 69.47 01/02/2023 1220   EGFR 77 07/24/2022 0933   GFRNONAA >60 01/11/2023 2001   GFRNONAA 80 03/28/2017 1433       Urinalysis Component     Latest Ref Rng 01/12/2023  Color, Urine     YELLOW  YELLOW   Appearance     CLEAR  HAZY !   Specific Gravity, Urine     1.005 - 1.030  1.018   pH     5.0 - 8.0  5.0    Glucose, UA     NEGATIVE mg/dL >=742 !   Hgb urine dipstick     NEGATIVE  NEGATIVE   Bilirubin Urine     NEGATIVE  NEGATIVE   Ketones, ur     NEGATIVE mg/dL NEGATIVE   Protein     NEGATIVE mg/dL NEGATIVE   Nitrite     NEGATIVE  NEGATIVE   Leukocytes,Ua     NEGATIVE  NEGATIVE   WBC, UA     0 - 5 WBC/hpf 0-5   Bacteria, UA     NONE SEEN  NONE SEEN   Squamous Epithelial / HPF     0 - 5 /HPF 0-5   RBC / HPF     0 - 5 RBC/hpf 0-5   RBC, Urine     0 - 2 /hpf   Epithelial Cells (non renal)     0 - 10 /hpf   Casts     None seen /lpf   Specific Gravity, UA     1.005 - 1.030    pH, UA     5.0 - 7.5    Color, UA     Yellow    Appearance Ur     Clear    Leukocytes,UA     Negative    Protein,UA     Negative/Trace    Ketones, UA     Negative    RBC, UA     Negative    Bilirubin, UA     Negative    Urobilinogen, Ur     0.2 - 1.0 mg/dL   Nitrite, UA     Negative    Microscopic Examination   Mucus     Legend: ! Abnormal I have reviewed the labs.   Pertinent Imaging: N/A   Assessment & Plan:    1.  High risk hematuria -Former smoker -Recent workup (07/2022) significant for radiation changes in the prostate -No reports of gross hematuria -Recent UA negative for micro heme  2. ED - insurance would not cover the IPP -His partner has medical issues that he is not sexually active at this time  3. Prostate cancer -PSA undetectable -Continue PSA every 6 months  4. BPH with LUTS -continue conservative management, avoiding bladder irritants and timed voiding's -Continue tamsulosin 0.4 mg daily     Return in about 6 months (around 08/08/2023) for PSA, I PSS .  These notes generated with voice recognition software. I apologize for typographical errors.  Cloretta Ned  [facility] [address]  [address] [place], [place] [address] [phone]

## 2023-02-07 ENCOUNTER — Encounter: Payer: Self-pay | Admitting: Urology

## 2023-02-07 ENCOUNTER — Encounter: Payer: Self-pay | Admitting: Cardiology

## 2023-02-07 ENCOUNTER — Ambulatory Visit: Payer: 59 | Attending: Cardiology | Admitting: Cardiology

## 2023-02-07 ENCOUNTER — Ambulatory Visit (INDEPENDENT_AMBULATORY_CARE_PROVIDER_SITE_OTHER): Payer: 59 | Admitting: Urology

## 2023-02-07 VITALS — BP 108/66 | HR 73 | Ht 71.0 in | Wt 201.8 lb

## 2023-02-07 VITALS — BP 125/77 | HR 89

## 2023-02-07 DIAGNOSIS — I10 Essential (primary) hypertension: Secondary | ICD-10-CM | POA: Diagnosis not present

## 2023-02-07 DIAGNOSIS — I442 Atrioventricular block, complete: Secondary | ICD-10-CM | POA: Diagnosis not present

## 2023-02-07 DIAGNOSIS — N529 Male erectile dysfunction, unspecified: Secondary | ICD-10-CM | POA: Diagnosis not present

## 2023-02-07 DIAGNOSIS — R319 Hematuria, unspecified: Secondary | ICD-10-CM

## 2023-02-07 DIAGNOSIS — Z87898 Personal history of other specified conditions: Secondary | ICD-10-CM

## 2023-02-07 DIAGNOSIS — C61 Malignant neoplasm of prostate: Secondary | ICD-10-CM | POA: Diagnosis not present

## 2023-02-07 DIAGNOSIS — I4821 Permanent atrial fibrillation: Secondary | ICD-10-CM

## 2023-02-07 DIAGNOSIS — Z95818 Presence of other cardiac implants and grafts: Secondary | ICD-10-CM

## 2023-02-07 DIAGNOSIS — N401 Enlarged prostate with lower urinary tract symptoms: Secondary | ICD-10-CM | POA: Diagnosis not present

## 2023-02-07 DIAGNOSIS — N138 Other obstructive and reflux uropathy: Secondary | ICD-10-CM

## 2023-02-07 LAB — BLADDER SCAN AMB NON-IMAGING: Scan Result: 63

## 2023-02-07 MED ORDER — TAMSULOSIN HCL 0.4 MG PO CAPS
0.4000 mg | ORAL_CAPSULE | Freq: Every day | ORAL | 3 refills | Status: DC
Start: 2023-02-07 — End: 2023-11-21

## 2023-02-07 NOTE — Progress Notes (Signed)
Cardiology Office Note:  .   Date:  02/07/2023  ID:  SRITHAN BLEAZARD, DOB 1950-09-04, MRN [id number] PCP: [name], NP  [facility] Providers Cardiologist:  [name], MD Electrophysiologist:  [name], MD     History of Present Illness: .   [name] Cook is a 72 y.o. male Discussed with the use of AI scribe  History of Present Illness   The patient, a 72 year old with a complex medical history including coronary artery disease with prior PCI to RCA in 2016, complete heart block necessitating a pacemaker, diabetes, hypertension, hyperlipidemia, obstructive sleep apnea, history of pulmonary embolism, and GI bleeding on anticoagulation for atrial fibrillation and flutter, presented for a follow-up visit. He had a Watchman device placed in March 2024 and transitioned to Plavix monotherapy for six months. The patient's Propranolol, which had been used for tremors, was discontinued due to dizziness in July 2024. He is currently on baby aspirin 81 mg.  The patient reported feeling generally well, with occasional shortness of breath upon exertion, which was not a new symptom. He is on a regimen of aspirin, Jardiance, glipizide, isosorbide, Imdur, and Crestor. His LDL is at goal and his hemoglobin A1c is well-controlled at 6.3. However, he reported a recent episode of hypoglycemia, which he managed with cheese crackers. The patient expressed satisfaction with his current treatment plan and did not express a desire for any changes.          ROS: no bleeding  Studies Reviewed: .        Results LABS LDL: 63 HbA1c: 6.3 Blood Glucose: 59 (02/06/2023) Risk Assessment/Calculations:            Physical Exam:   VS:  BP 108/66   Pulse 73   Ht 5\' 11"  (1.803 m)   Wt 201 lb 12.8 oz (91.5 kg)   SpO2 95%   BMI 28.15 kg/m    Wt Readings from Last 3 Encounters:  02/07/23 201 lb 12.8 oz (91.5 kg)  01/02/23 202 lb (91.6 kg)  12/25/22 191 lb 5.8 oz (86.8 kg)    GEN: Well  nourished, well developed in no acute distress NECK: No JVD; No carotid bruits CARDIAC: IRRR, no murmurs, no rubs, no gallops RESPIRATORY:  Clear to auscultation without rales, wheezing or rhonchi  ABDOMEN: Soft, non-tender, non-distended EXTREMITIES:  No edema; No deformity   ASSESSMENT AND PLAN: .    Assessment and Plan    Atrial Fibrillation/Flutter Status post Watchman device placement on 06/22/2022. Transitioned to Plavix monotherapy for 6 months, now on Aspirin 81mg  daily. No new symptoms. -Continue Aspirin 81mg  daily.  Coronary Artery Disease Prior PCI to RCA in 2016, on Isosorbide 60mg  daily. No new chest pain. -Continue Isosorbide 60mg  daily. -ASA 81 mg  Diabetes Mellitus Hemoglobin A1c 6.3, on Jardiance and Glipizide. Noted hypoglycemic episode with blood glucose of 59. -Continue Jardiance and Glipizide. Monitor for further hypoglycemic episodes.  Hyperlipidemia LDL 63, at goal. On Crestor 20mg  daily. -Continue Crestor 20mg  daily.  PACER -Dr. Ladona Ridgel. [facility]. Functioning well.   Follow-up Stable on current regimen. -Schedule annual follow-up appointment.               Signed, [name], MD

## 2023-02-07 NOTE — Patient Instructions (Signed)
Medication Instructions:  Your physician recommends that you continue on your current medications as directed. Please refer to the Current Medication list given to you today.  *If you need a refill on your cardiac medications before your next appointment, please call your pharmacy*  Follow-Up: At [facility], you and your health needs are our priority.  As part of our continuing mission to provide you with exceptional heart care, we have created designated Provider Care Teams.  These Care Teams include your primary Cardiologist (physician) and Advanced Practice Providers (APPs -  Physician Assistants and Nurse Practitioners) who all work together to provide you with the care you need, when you need it.  Your next appointment:   1 year   Provider:   [name], MD

## 2023-02-12 NOTE — Progress Notes (Signed)
Remote pacemaker transmission.   

## 2023-02-13 ENCOUNTER — Encounter: Payer: Self-pay | Admitting: Gastroenterology

## 2023-02-13 ENCOUNTER — Telehealth: Payer: Self-pay

## 2023-02-13 ENCOUNTER — Ambulatory Visit (INDEPENDENT_AMBULATORY_CARE_PROVIDER_SITE_OTHER): Payer: 59 | Admitting: Gastroenterology

## 2023-02-13 ENCOUNTER — Other Ambulatory Visit: Payer: Self-pay

## 2023-02-13 VITALS — BP 108/68 | HR 88 | Temp 98.0°F | Ht 71.0 in | Wt 197.1 lb

## 2023-02-13 DIAGNOSIS — Z8719 Personal history of other diseases of the digestive system: Secondary | ICD-10-CM

## 2023-02-13 DIAGNOSIS — Z09 Encounter for follow-up examination after completed treatment for conditions other than malignant neoplasm: Secondary | ICD-10-CM

## 2023-02-13 DIAGNOSIS — K269 Duodenal ulcer, unspecified as acute or chronic, without hemorrhage or perforation: Secondary | ICD-10-CM

## 2023-02-13 MED ORDER — PANTOPRAZOLE SODIUM 40 MG PO TBEC: 40.0000 mg | DELAYED_RELEASE_TABLET | Freq: Two times a day (BID) | ORAL | 1 refills | Status: DC

## 2023-02-13 NOTE — Telephone Encounter (Signed)
Patient states at appointment and then again when scheduling the EGD he was not taking the Plavix 75mg  because the Hospital took him off the medication because he states they told him that could be leading to his GI bleed.

## 2023-02-13 NOTE — Progress Notes (Signed)
Wyline Mood MD, MRCP(U.K) [address]  [address]  [place], [place] [address]  Main: [phone]  Fax: [phone]   Primary Care Physician: [name], NP  Primary Gastroenterologist:  Dr. Wyline Mood   Chief Complaint  Patient presents with   GI Bleeding    HPI: [name] Cook is a 72 y.o. male   Summary of history :  He was last seen in my office back in 2023 February for diarrhea that had been going on for a few months likely related to Januvia which on cessation resolved the diarrhea.  In addition he had been on Motrin, metformin and consuming diet soda at least 20 ounces per day..  Previously scheduled for colonoscopy on multiple occasions colonoscopy in 2020 showed a 15 mm polyp in th.  e transverse colon resected piecemeal that was a tubular adenoma  Interval history 06/02/2021 -02/13/2023  06/13/2021:: Colonoscopy 5 mm polyp in the sigmoid colon which was resected 10 mm polyp in the distal ascending colon removed with a cold snare.  Tubular adenoma x 2.  Repeat colonoscopy in 3 years recommended.  November 2023 came in with melena underwent EGD that showed erosive gastritis capsule study showed no active bleeding.  Discharged home. During that admission he was Plavix and Coumadin  September 2024 again admitted to [facility] with a GI bleed hematemesis rectal bleeding with hypotension CT angiogram did not show any active bleeding.  EGD showed actively bleeding duodenal ulcer on 12/21/2022 with significant bleeding underwent cauterization application appears that Plavix was stopped indefinitely.  Multiple duodenal ulcers were seen in the second portion of the duodenum and hence repeat EGD was requested to be done as an outpatient to confirm healing.  01/11/2023 hemoglobin 9 g  He has been doing well since discharge brown stools.  Not on Plavix.  Taking Protonix once a day.  No other complaints.  No NSAID use.  Current Outpatient Medications  Medication Sig  Dispense Refill   albuterol (VENTOLIN HFA) 108 (90 Base) MCG/ACT inhaler INHALE 2 PUFFS BY MOUTH EVERY 4 HOURS AS NEEDED FOR WHEEZE OR FOR SHORTNESS OF BREATH 18 each 0   Ascorbic Acid (VITAMIN C) 1000 MG tablet Take 2,000 mg by mouth daily.     aspirin EC 81 MG tablet Take 1 tablet (81 mg total) by mouth daily. Swallow whole.     blood glucose meter kit and supplies KIT Dispense based on patient and insurance preference. Use up to four times daily as directed. (FOR ICD-9 250.00, 250.01). 1 each 0   budesonide-formoterol (SYMBICORT) 160-4.5 MCG/ACT inhaler INHALE 2 PUFFS INTO THE LUNGS TWICE A DAY (Patient taking differently: Inhale 2 puffs into the lungs daily as needed (for shortness of breath and wheezing).) 10.2 each 5   clopidogrel (PLAVIX) 75 MG tablet Take 75 mg by mouth daily.     empagliflozin (JARDIANCE) 25 MG TABS tablet TAKE 1 TABLET (25 MG TOTAL) BY MOUTH DAILY BEFORE BREAKFAST. FOR DIABETES. 90 tablet 0   fluticasone (FLONASE) 50 MCG/ACT nasal spray PLACE 1 SPRAY INTO BOTH NOSTRILS DAILY AS NEEDED FOR ALLERGIES OR RHINITIS. 48 mL 0   gabapentin (NEURONTIN) 300 MG capsule TAKE 1 CAPSULE BY MOUTH EVERY MORNING AND 2 CAPSULES BY MOUTH AT NIGHT FOR NEUROPATHY 270 capsule 2   glipiZIDE (GLUCOTROL XL) 10 MG 24 hr tablet TAKE 1 TABLET (10 MG TOTAL) BY MOUTH DAILY WITH BREAKFAST. FOR DIABETES. 90 tablet 1   glucose blood (ACCU-CHEK GUIDE) test strip TEST FOUR  TIMES A DAY 400 strip 0   isosorbide mononitrate (IMDUR) 60 MG 24 hr tablet Take 1 tablet (60 mg total) by mouth daily. 90 tablet 3   lisinopril (ZESTRIL) 2.5 MG tablet Take 2.5 mg by mouth daily.     nitroGLYCERIN (NITROSTAT) 0.4 MG SL tablet Place 1 tablet (0.4 mg total) under the tongue every 5 (five) minutes as needed for chest pain. Do not exceed 3 tablets in 24 hours. 25 tablet 0   pantoprazole (PROTONIX) 40 MG tablet Take 1 tablet (40 mg total) by mouth 2 (two) times daily before a meal. For ulcer 180 tablet 1   rosuvastatin  (CRESTOR) 20 MG tablet TAKE 1 TABLET BY MOUTH EVERY DAY IN THE EVENING FOR CHOLESTEROL 90 tablet 2   sertraline (ZOLOFT) 100 MG tablet TAKE 1 TABLET (100 MG TOTAL) BY MOUTH DAILY. FOR ANXIETY AND DEPRESSION. 90 tablet 0   tamsulosin (FLOMAX) 0.4 MG CAPS capsule Take 1 capsule (0.4 mg total) by mouth daily. 90 capsule 3   TYLENOL 500 MG tablet Take 1,000 mg by mouth daily.     No current facility-administered medications for this visit.    Allergies as of 02/13/2023 - Review Complete 02/13/2023  Allergen Reaction Noted   Januvia [sitagliptin] Other (See Comments) 06/23/2021   Metformin and related Diarrhea 06/23/2021      ROS:  General: Negative for anorexia, weight loss, fever, chills, fatigue, weakness. ENT: Negative for hoarseness, difficulty swallowing , nasal congestion. CV: Negative for chest pain, angina, palpitations, dyspnea on exertion, peripheral edema.  Respiratory: Negative for dyspnea at rest, dyspnea on exertion, cough, sputum, wheezing.  GI: See history of present illness. GU:  Negative for dysuria, hematuria, urinary incontinence, urinary frequency, nocturnal urination.  Endo: Negative for unusual weight change.    Physical Examination:   BP 108/68 (BP Location: Left Arm, Patient Position: Sitting, Cuff Size: Normal)   Pulse 88   Temp 98 F (36.7 C) (Oral)   Ht 5\' 11"  (1.803 m)   Wt 197 lb 2 oz (89.4 kg)   BMI 27.49 kg/m   General: Well-nourished, well-developed in no acute distress.  Eyes: No icterus. Conjunctivae pink. Mouth: Oropharyngeal mucosa moist and pink , no lesions erythema or exudate. Neuro: Alert and oriented x 3.  Grossly intact. Skin: Warm and dry, no jaundice.   Psych: Alert and cooperative, normal mood and affect.   Imaging Studies: CUP PACEART REMOTE DEVICE CHECK  Result Date: 01/30/2023 Scheduled remote reviewed. Normal device function.  Presenting EGM c/w AFL, programmed VVIR, known hx of AF s/p Watchman per Epic. Next remote 91  days. [facility], [facility]   Assessment and Plan:   [name] Cook is a 72 y.o. y/o male here to follow-up from recent hospital discharge on 01/05/2023 when he presented with his third episode of melena.  Upper endoscopy showed actively bleeding duodenal ulcer which was treated with heat as well as epinephrine few other clean-based ulcers seen in the second portion of the duodenum.He had been on Plavix at that point of time.  Plan 1.  Check hemoglobin 2.  Continue Prilosec 40 mg twice daily for 8 weeks then at follow-up visit if doing well we will reduce to once a day which she needs to continue indefinitely. 3.  H. pylori breath test at next visit if biopsies taken during endoscopy are negative 4.  Stop all NSAID use 5.  If persistent ulceration seen we will need to rule out Zollinger-Ellison syndrome   I have discussed alternative options,  risks & benefits,  which include, but are not limited to, bleeding, infection, perforation,respiratory complication & drug reaction.  The patient agrees with this plan & written consent will be obtained.        Dr Wyline Mood  MD,MRCP [facility]) Follow up in 8 weeks with [name] Cook

## 2023-02-14 LAB — CBC WITH DIFFERENTIAL/PLATELET
Basophils Absolute: 0.1 10*3/uL (ref 0.0–0.2)
Basos: 1 %
EOS (ABSOLUTE): 0.2 10*3/uL (ref 0.0–0.4)
Eos: 4 %
Hematocrit: 36 % — ABNORMAL LOW (ref 37.5–51.0)
Hemoglobin: 10.7 g/dL — ABNORMAL LOW (ref 13.0–17.7)
Immature Grans (Abs): 0 10*3/uL (ref 0.0–0.1)
Immature Granulocytes: 0 %
Lymphocytes Absolute: 1.3 10*3/uL (ref 0.7–3.1)
Lymphs: 32 %
MCH: 28.3 pg (ref 26.6–33.0)
MCHC: 29.7 g/dL — ABNORMAL LOW (ref 31.5–35.7)
MCV: 95 fL (ref 79–97)
Monocytes Absolute: 0.4 10*3/uL (ref 0.1–0.9)
Monocytes: 11 %
Neutrophils Absolute: 2.1 10*3/uL (ref 1.4–7.0)
Neutrophils: 52 %
Platelets: 157 10*3/uL (ref 150–450)
RBC: 3.78 x10E6/uL — ABNORMAL LOW (ref 4.14–5.80)
RDW: 14.6 % (ref 11.6–15.4)
WBC: 4 10*3/uL (ref 3.4–10.8)

## 2023-02-14 LAB — H. PYLORI BREATH TEST: H pylori Breath Test: NEGATIVE

## 2023-02-16 ENCOUNTER — Encounter (HOSPITAL_COMMUNITY): Payer: Self-pay | Admitting: Cardiology

## 2023-03-11 ENCOUNTER — Other Ambulatory Visit: Payer: Self-pay | Admitting: Primary Care

## 2023-03-11 DIAGNOSIS — J439 Emphysema, unspecified: Secondary | ICD-10-CM

## 2023-03-11 DIAGNOSIS — J309 Allergic rhinitis, unspecified: Secondary | ICD-10-CM

## 2023-03-11 DIAGNOSIS — E1165 Type 2 diabetes mellitus with hyperglycemia: Secondary | ICD-10-CM

## 2023-03-14 ENCOUNTER — Encounter
Admission: RE | Disposition: A | Discharge status: Home / Self Care | Payer: Self-pay | Source: Home / Self Care | Attending: Gastroenterology

## 2023-03-14 ENCOUNTER — Ambulatory Visit: Payer: 59

## 2023-03-14 ENCOUNTER — Other Ambulatory Visit: Payer: Self-pay

## 2023-03-14 ENCOUNTER — Ambulatory Visit
Admission: RE | Admit: 2023-03-14 | Discharge: 2023-03-14 | Disposition: A | Discharge status: Home / Self Care | Payer: 59 | Attending: Gastroenterology | Admitting: Gastroenterology

## 2023-03-14 ENCOUNTER — Encounter: Payer: Self-pay | Admitting: Gastroenterology

## 2023-03-14 DIAGNOSIS — Z09 Encounter for follow-up examination after completed treatment for conditions other than malignant neoplasm: Secondary | ICD-10-CM

## 2023-03-14 DIAGNOSIS — Z87891 Personal history of nicotine dependence: Secondary | ICD-10-CM | POA: Diagnosis not present

## 2023-03-14 DIAGNOSIS — F32A Depression, unspecified: Secondary | ICD-10-CM | POA: Diagnosis not present

## 2023-03-14 DIAGNOSIS — Z8719 Personal history of other diseases of the digestive system: Secondary | ICD-10-CM

## 2023-03-14 DIAGNOSIS — Z7984 Long term (current) use of oral hypoglycemic drugs: Secondary | ICD-10-CM | POA: Insufficient documentation

## 2023-03-14 DIAGNOSIS — I119 Hypertensive heart disease without heart failure: Secondary | ICD-10-CM | POA: Insufficient documentation

## 2023-03-14 DIAGNOSIS — N289 Disorder of kidney and ureter, unspecified: Secondary | ICD-10-CM | POA: Diagnosis not present

## 2023-03-14 DIAGNOSIS — I4819 Other persistent atrial fibrillation: Secondary | ICD-10-CM | POA: Insufficient documentation

## 2023-03-14 DIAGNOSIS — K219 Gastro-esophageal reflux disease without esophagitis: Secondary | ICD-10-CM | POA: Insufficient documentation

## 2023-03-14 DIAGNOSIS — I252 Old myocardial infarction: Secondary | ICD-10-CM | POA: Insufficient documentation

## 2023-03-14 DIAGNOSIS — I2510 Atherosclerotic heart disease of native coronary artery without angina pectoris: Secondary | ICD-10-CM | POA: Insufficient documentation

## 2023-03-14 DIAGNOSIS — Z79899 Other long term (current) drug therapy: Secondary | ICD-10-CM | POA: Diagnosis not present

## 2023-03-14 DIAGNOSIS — K260 Acute duodenal ulcer with hemorrhage: Secondary | ICD-10-CM | POA: Insufficient documentation

## 2023-03-14 DIAGNOSIS — E119 Type 2 diabetes mellitus without complications: Secondary | ICD-10-CM | POA: Insufficient documentation

## 2023-03-14 DIAGNOSIS — G4730 Sleep apnea, unspecified: Secondary | ICD-10-CM | POA: Insufficient documentation

## 2023-03-14 DIAGNOSIS — J449 Chronic obstructive pulmonary disease, unspecified: Secondary | ICD-10-CM | POA: Insufficient documentation

## 2023-03-14 HISTORY — PX: ESOPHAGOGASTRODUODENOSCOPY (EGD) WITH PROPOFOL: SHX5813

## 2023-03-14 SURGERY — ESOPHAGOGASTRODUODENOSCOPY (EGD) WITH PROPOFOL
Anesthesia: General

## 2023-03-14 MED ORDER — LIDOCAINE HCL (PF) 2 % IJ SOLN
INTRAMUSCULAR | Status: DC | PRN
  Administered 2023-03-14: 100 mg via INTRADERMAL

## 2023-03-14 MED ORDER — SODIUM CHLORIDE 0.9 % IV SOLN: INTRAVENOUS | Status: DC

## 2023-03-14 MED ORDER — PROPOFOL 500 MG/50ML IV EMUL
INTRAVENOUS | Status: DC | PRN
  Administered 2023-03-14: 100 mg via INTRAVENOUS
  Administered 2023-03-14: 50 mg via INTRAVENOUS

## 2023-03-14 NOTE — Op Note (Signed)
[facility] Gastroenterology Patient Name: [name] Cook Procedure Date: 03/14/2023 9:42 AM MRN: [id number] Account #: [id number] Date of Birth: Dec 09, 1950 Admit Type: Outpatient Age: 72 Room: [facility] ENDO ROOM 3 Gender: Male Note Status: Finalized Instrument Name: Upper Endoscope [id number] Procedure:             Upper GI endoscopy Indications:           Follow-up of acute duodenal ulcer with hemorrhage Providers:             Wyline Mood MD, MD Referring MD:          [name] (Referring MD) Medicines:             Monitored Anesthesia Care Complications:         No immediate complications. Procedure:             Pre-Anesthesia Assessment:                        - Prior to the procedure, a History and Physical was                         performed, and patient medications, allergies and                         sensitivities were reviewed. The patient's tolerance                         of previous anesthesia was reviewed.                        - The risks and benefits of the procedure and the                         sedation options and risks were discussed with the                         patient. All questions were answered and informed                         consent was obtained.                        - ASA Grade Assessment: II - A patient with mild                         systemic disease.                        After obtaining informed consent, the endoscope was                         passed under direct vision. Throughout the procedure,                         the patient's blood pressure, pulse, and oxygen                         saturations were monitored continuously. The Endoscope  was introduced through the mouth, and advanced to the                         third part of duodenum. The upper GI endoscopy was                         accomplished with ease. The patient tolerated the                         procedure  well. Findings:      The esophagus was normal.      The stomach was normal.      The examined duodenum was normal. Impression:            - Normal esophagus.                        - Normal stomach.                        - Normal examined duodenum.                        - No specimens collected. Recommendation:        - Discharge patient to home (with escort).                        - Resume previous diet.                        - Continue present medications.                        - Return to my office PRN. Procedure Code(s):     --- Professional ---                        [phone], Esophagogastroduodenoscopy, flexible,                         transoral; diagnostic, including collection of                         specimen(s) by brushing or washing, when performed                         (separate procedure) Diagnosis Code(s):     --- Professional ---                        K26.0, Acute duodenal ulcer with hemorrhage CPT copyright 2022 American Medical Association. All rights reserved. The codes documented in this report are preliminary and upon coder review may  be revised to meet current compliance requirements. Wyline Mood, MD Wyline Mood MD, MD 03/14/2023 9:57:45 AM This report has been signed electronically. Number of Addenda: 0 Note Initiated On: 03/14/2023 9:42 AM Estimated Blood Loss:  Estimated blood loss: none.      [facility]

## 2023-03-14 NOTE — Anesthesia Postprocedure Evaluation (Signed)
Anesthesia Post Note  Patient: [name] Cook  Procedure(s) Performed: ESOPHAGOGASTRODUODENOSCOPY (EGD) WITH PROPOFOL  Patient location during evaluation: PACU Anesthesia Type: General Level of consciousness: awake and awake and alert Pain management: pain level controlled Vital Signs Assessment: post-procedure vital signs reviewed and stable Respiratory status: nonlabored ventilation Cardiovascular status: stable Anesthetic complications: no   There were no known notable events for this encounter.   Last Vitals:  Vitals:   03/14/23 1003 03/14/23 1013  BP: 107/67 115/73  Pulse: 70 71  Resp: 15 18  Temp:    SpO2: 94% 98%    Last Pain:  Vitals:   03/14/23 1013  TempSrc:   PainSc: 0-No pain                 VAN STAVEREN,Helon Wisinski

## 2023-03-14 NOTE — Anesthesia Preprocedure Evaluation (Signed)
Anesthesia Evaluation  Patient identified by MRN, date of birth, ID band Patient awake  General Assessment Comment:Slow mentation  Reviewed: Allergy & Precautions, NPO status , Patient's Chart, lab work & pertinent test results  Airway Mallampati: II  TM Distance: >3 FB Neck ROM: full    Dental  (+) Upper Dentures, Lower Dentures   Pulmonary neg pulmonary ROS, shortness of breath, sleep apnea , COPD, former smoker   Pulmonary exam normal  + decreased breath sounds      Cardiovascular Exercise Tolerance: Good hypertension, Pt. on medications + CAD and + Past MI  negative cardio ROS Normal cardiovascular exam+ dysrhythmias Atrial Fibrillation  Rhythm:Regular Rate:Normal     Neuro/Psych    Depression    negative neurological ROS  negative psych ROS   GI/Hepatic negative GI ROS, Neg liver ROS,GERD  Medicated,,  Endo/Other  negative endocrine ROSdiabetes, Type 2, Oral Hypoglycemic Agents    Renal/GU Renal InsufficiencyRenal diseasenegative Renal ROS  negative genitourinary   Musculoskeletal   Abdominal  (+) + obese  Peds negative pediatric ROS (+)  Hematology negative hematology ROS (+) Blood dyscrasia, anemia   Anesthesia Other Findings Past Medical History: 03/07/2021: Acute bronchitis with COPD (HCC) 03/08/2022: Acute pulmonary embolism (HCC) No date: Arthritis     Comment:  "knees and lower back" (03/14/2013) No date: Atrial flutter (HCC)     Comment:  radiofrequency ablation in 2001 No date: CAD (coronary artery disease)     Comment:  a. Nonobstructive. Cardiac cath in 2001-50% mid RI,               normal LM, LAD, RCA b. cath 10/16/2014 95% mid RCA treated               with DES, 99% ost D1 medical management due to small               aneurysmal segment No date: Chronic anticoagulation     Comment:  chronic Coumadin anticoagulation 04/20/2011: Chronic obstructive pulmonary disease (HCC) No date: Diabetes  mellitus, type 2 (HCC) 10/26/2021: Diaphoresis 06/23/2021: Elevated lipase No date: GERD (gastroesophageal reflux disease) 03/08/2022: GIB (gastrointestinal bleeding) No date: Hyperlipidemia No date: Hypertension     Comment:  with hypertensive heart disease 10/25/2017: Left knee pain     Comment:  medial No date: Obesity No date: Persistent atrial fibrillation (HCC)     Comment:  recurrent atrial flutter since 2001 s/p DCCVs, multiple               failed AADs, h/o tachy-mediated cardiomyopathy 06/22/2022: Presence of Watchman left atrial appendage closure device     Comment:  Watchman  31mm FLX placed with Dr. Excell Seltzer No date: Shortness of breath     Comment:  "can come on at any time" (03/14/2013) No date: Sleep apnea     Comment:  "dx'd; couldn't wear the mask" (03/14/2013) 03/08/2022: Symptomatic anemia No date: Tobacco abuse 05/26/2022: Urinary tract infection without hematuria  Past Surgical History: 2002: ATRIAL FLUTTER ABLATION     Comment:  atrial flutter; subsequently developed atrial               fibrillation 01/24/2013: AV NODE ABLATION 2002: CARDIAC CATHETERIZATION 10/16/2014: CARDIAC CATHETERIZATION; N/A     Comment:  Procedure: Left Heart Cath and Coronary Angiography;                Surgeon: Tonny Bollman, MD;  Location: [facility] INVASIVE CV  LAB;  Service: Cardiovascular;  Laterality: N/A; 05/31/2011: CARDIOVERSION     Comment:  Procedure: CARDIOVERSION;  Surgeon: [name]. [name],               MD;  Location: [facility] ORS;  Service: Cardiovascular;                Laterality: N/A; 1980's: CARPAL TUNNEL RELEASE; Left 12/30/2018: COLONOSCOPY WITH PROPOFOL; N/A     Comment:  Procedure: COLONOSCOPY WITH PROPOFOL;  Surgeon: Wyline Mood, MD;  Location: [facility] ENDOSCOPY;  Service:               Gastroenterology;  Laterality: N/A; 12/04/2019: COLONOSCOPY WITH PROPOFOL; N/A     Comment:  Procedure: COLONOSCOPY WITH PROPOFOL;  Surgeon: Wyline Mood, MD;  Location: [facility] ENDOSCOPY;  Service:               Gastroenterology;  Laterality: N/A; 06/13/2021: COLONOSCOPY WITH PROPOFOL; N/A     Comment:  Procedure: COLONOSCOPY WITH PROPOFOL;  Surgeon: Wyline Mood, MD;  Location: [facility] ENDOSCOPY;  Service:               Gastroenterology;  Laterality: N/A; 03/01/2022: ESOPHAGOGASTRODUODENOSCOPY; N/A     Comment:  Procedure: ESOPHAGOGASTRODUODENOSCOPY (EGD);  Surgeon:               Vida Rigger, MD;  Location: [facility] ENDOSCOPY;  Service:               Gastroenterology;  Laterality: N/A; 12/21/2022: ESOPHAGOGASTRODUODENOSCOPY (EGD) WITH PROPOFOL; N/A     Comment:  Procedure: ESOPHAGOGASTRODUODENOSCOPY (EGD) WITH               PROPOFOL;  Surgeon: Benancio Deeds, MD;  Location:               [facility] ENDOSCOPY;  Service: Gastroenterology;  Laterality:               N/A; 12/23/2022: ESOPHAGOGASTRODUODENOSCOPY (EGD) WITH PROPOFOL; N/A     Comment:  Procedure: ESOPHAGOGASTRODUODENOSCOPY (EGD) WITH               PROPOFOL;  Surgeon: Benancio Deeds, MD;  Location:               [facility] ENDOSCOPY;  Service: Gastroenterology;  Laterality:               N/A; 03/10/2022: GIVENS CAPSULE STUDY; Left     Comment:  Procedure: GIVENS CAPSULE STUDY;  Surgeon: Willis Modena, MD;  Location: [facility] ENDOSCOPY;  Service:               Gastroenterology;  Laterality: Left; 12/23/2022: HEMOSTASIS CLIP PLACEMENT     Comment:  Procedure: HEMOSTASIS CLIP PLACEMENT;  Surgeon:               Benancio Deeds, MD;  Location: [facility] ENDOSCOPY;                Service: Gastroenterology;; 12/21/2022: HEMOSTASIS CONTROL     Comment:  Procedure: HEMOSTASIS CONTROL;  Surgeon: Benancio Deeds, MD;  Location: [facility] ENDOSCOPY;  Service:  Gastroenterology;; 01/24/2013: Jobie Quaker / REPLACE / REMOVE PACEMAKER     Comment:   Medtronic Adapta L dual-chamber pacemaker, serial               number NWE [id number]  06/22/2022: LEFT ATRIAL APPENDAGE  OCCLUSION; N/A     Comment:  Procedure: LEFT ATRIAL APPENDAGE OCCLUSION;  Surgeon:               Tonny Bollman, MD;  Location: [facility] INVASIVE CV LAB;                Service: Cardiovascular;  Laterality: N/A; 06/07/2018: LEFT HEART CATH AND CORONARY ANGIOGRAPHY; N/A     Comment:  Procedure: LEFT HEART CATH AND CORONARY ANGIOGRAPHY;                Surgeon: Tonny Bollman, MD;  Location: [facility] INVASIVE CV               LAB;  Service: Cardiovascular;  Laterality: N/A; 03/17/2013: LEFT HEART CATHETERIZATION WITH CORONARY ANGIOGRAM; N/A     Comment:  Procedure: LEFT HEART CATHETERIZATION WITH CORONARY               ANGIOGRAM;  Surgeon: Kathleene Hazel, MD; LAD mild              dz, D1 branch 100%, inferior branch 99%, CFX OK, RCA 50%,              EF 65%   2002: LOOP RECORDER IMPLANT 01/24/2013: PERMANENT PACEMAKER INSERTION; N/A     Comment:  Procedure: PERMANENT PACEMAKER INSERTION;  Surgeon:               Marinus Maw, MD;  Location: [facility] CATH LAB;  Service:               Cardiovascular;  Laterality: N/A; 06/22/2022: TEE WITHOUT CARDIOVERSION; N/A     Comment:  Procedure: TRANSESOPHAGEAL ECHOCARDIOGRAM;  Surgeon:               Tonny Bollman, MD;  Location: [facility] INVASIVE CV LAB;                Service: Cardiovascular;  Laterality: N/A; ~ 2003: TIBIAL TUBERCLERPLASTY  BMI    Body Mass Index: 27.75 kg/m      Reproductive/Obstetrics negative OB ROS                             Anesthesia Physical Anesthesia Plan  ASA: 3  Anesthesia Plan: General   Post-op Pain Management:    Induction: Intravenous  PONV Risk Score and Plan: Propofol infusion and TIVA  Airway Management Planned: Natural Airway and Nasal Cannula  Additional Equipment:   Intra-op Plan:   Post-operative Plan:   Informed Consent: I have reviewed the patients History and Physical, chart, labs and discussed the procedure including the risks, benefits and alternatives for the proposed  anesthesia with the patient or authorized representative who has indicated his/her understanding and acceptance.     Dental Advisory Given  Plan Discussed with: CRNA and Surgeon  Anesthesia Plan Comments:        Anesthesia Quick Evaluation

## 2023-03-14 NOTE — Transfer of Care (Signed)
Immediate Anesthesia Transfer of Care Note  Patient: [name] Cook  Procedure(s) Performed: ESOPHAGOGASTRODUODENOSCOPY (EGD) WITH PROPOFOL  Patient Location: PACU  Anesthesia Type:General  Level of Consciousness: sedated  Airway & Oxygen Therapy: Patient Spontanous Breathing  Post-op Assessment: Report given to RN and Post -op Vital signs reviewed and stable  Post vital signs: Reviewed and stable  Last Vitals:  Vitals Value Taken Time  BP 107/67 03/14/23 1003  Temp    Pulse 70 03/14/23 1003  Resp 15 03/14/23 1003  SpO2 93 % 03/14/23 1003  Vitals shown include unfiled device data.  Last Pain:  Vitals:   03/14/23 1003  TempSrc:   PainSc: 0-No pain         Complications: There were no known notable events for this encounter.

## 2023-03-14 NOTE — H&P (Signed)
Wyline Mood, MD [address], [address], [place], [place], [address] [address], [place], [place], [address] Phone: [phone]  Fax: [phone]  Primary Care Physician:  [name], NP   Pre-Procedure History & Physical: HPI:  [name] Cook is a 72 y.o. male is here for an endoscopy    Past Medical History:  Diagnosis Date   Acute bronchitis with COPD (HCC) 03/07/2021   Acute pulmonary embolism (HCC) 03/08/2022   Arthritis    "knees and lower back" (03/14/2013)   Atrial flutter (HCC)    radiofrequency ablation in 2001   CAD (coronary artery disease)    a. Nonobstructive. Cardiac cath in 2001-50% mid RI, normal LM, LAD, RCA b. cath 10/16/2014 95% mid RCA treated with DES, 99% ost D1 medical management due to small aneurysmal segment   Chronic anticoagulation    chronic Coumadin anticoagulation   Chronic obstructive pulmonary disease (HCC) 04/20/2011   Diabetes mellitus, type 2 (HCC)    Diaphoresis 10/26/2021   Elevated lipase 06/23/2021   GERD (gastroesophageal reflux disease)    GIB (gastrointestinal bleeding) 03/08/2022   Hyperlipidemia    Hypertension    with hypertensive heart disease   Left knee pain 10/25/2017   medial   Obesity    Persistent atrial fibrillation (HCC)    recurrent atrial flutter since 2001 s/p DCCVs, multiple failed AADs, h/o tachy-mediated cardiomyopathy   Presence of Watchman left atrial appendage closure device 06/22/2022   Watchman  31mm FLX placed with Dr. Excell Seltzer   Shortness of breath    "can come on at any time" (03/14/2013)   Sleep apnea    "dx'd; couldn't wear the mask" (03/14/2013)   Symptomatic anemia 03/08/2022   Tobacco abuse    Urinary tract infection without hematuria 05/26/2022    Past Surgical History:  Procedure Laterality Date   ATRIAL FLUTTER ABLATION  2002   atrial flutter; subsequently developed atrial fibrillation   AV NODE ABLATION  01/24/2013   CARDIAC CATHETERIZATION  2002   CARDIAC  CATHETERIZATION N/A 10/16/2014   Procedure: Left Heart Cath and Coronary Angiography;  Surgeon: Tonny Bollman, MD;  Location: [facility] INVASIVE CV LAB;  Service: Cardiovascular;  Laterality: N/A;   CARDIOVERSION  05/31/2011   Procedure: CARDIOVERSION;  Surgeon: [name]. [name], MD;  Location: [facility] ORS;  Service: Cardiovascular;  Laterality: N/A;   CARPAL TUNNEL RELEASE Left 1980's   COLONOSCOPY WITH PROPOFOL N/A 12/30/2018   Procedure: COLONOSCOPY WITH PROPOFOL;  Surgeon: Wyline Mood, MD;  Location: [facility] ENDOSCOPY;  Service: Gastroenterology;  Laterality: N/A;   COLONOSCOPY WITH PROPOFOL N/A 12/04/2019   Procedure: COLONOSCOPY WITH PROPOFOL;  Surgeon: Wyline Mood, MD;  Location: [facility] ENDOSCOPY;  Service: Gastroenterology;  Laterality: N/A;   COLONOSCOPY WITH PROPOFOL N/A 06/13/2021   Procedure: COLONOSCOPY WITH PROPOFOL;  Surgeon: Wyline Mood, MD;  Location: [facility] ENDOSCOPY;  Service: Gastroenterology;  Laterality: N/A;   ESOPHAGOGASTRODUODENOSCOPY N/A 03/01/2022   Procedure: ESOPHAGOGASTRODUODENOSCOPY (EGD);  Surgeon: Vida Rigger, MD;  Location: [facility] ENDOSCOPY;  Service: Gastroenterology;  Laterality: N/A;   ESOPHAGOGASTRODUODENOSCOPY (EGD) WITH PROPOFOL N/A 12/21/2022   Procedure: ESOPHAGOGASTRODUODENOSCOPY (EGD) WITH PROPOFOL;  Surgeon: Benancio Deeds, MD;  Location: [facility] ENDOSCOPY;  Service: Gastroenterology;  Laterality: N/A;   ESOPHAGOGASTRODUODENOSCOPY (EGD) WITH PROPOFOL N/A 12/23/2022   Procedure: ESOPHAGOGASTRODUODENOSCOPY (EGD) WITH PROPOFOL;  Surgeon: Benancio Deeds, MD;  Location: [facility] ENDOSCOPY;  Service: Gastroenterology;  Laterality: N/A;   GIVENS CAPSULE STUDY Left 03/10/2022   Procedure: GIVENS CAPSULE STUDY;  Surgeon: Willis Modena,  MD;  Location: [facility] ENDOSCOPY;  Service: Gastroenterology;  Laterality: Left;   HEMOSTASIS CLIP PLACEMENT  12/23/2022   Procedure: HEMOSTASIS CLIP PLACEMENT;  Surgeon: Benancio Deeds, MD;  Location: [facility] ENDOSCOPY;  Service: Gastroenterology;;    HEMOSTASIS CONTROL  12/21/2022   Procedure: HEMOSTASIS CONTROL;  Surgeon: Benancio Deeds, MD;  Location: [facility] ENDOSCOPY;  Service: Gastroenterology;;   INSERT / REPLACE / REMOVE PACEMAKER  01/24/2013    Medtronic Adapta L dual-chamber pacemaker, serial number [id number]    LEFT ATRIAL APPENDAGE OCCLUSION N/A 06/22/2022   Procedure: LEFT ATRIAL APPENDAGE OCCLUSION;  Surgeon: Tonny Bollman, MD;  Location: [facility] INVASIVE CV LAB;  Service: Cardiovascular;  Laterality: N/A;   LEFT HEART CATH AND CORONARY ANGIOGRAPHY N/A 06/07/2018   Procedure: LEFT HEART CATH AND CORONARY ANGIOGRAPHY;  Surgeon: Tonny Bollman, MD;  Location: [facility] INVASIVE CV LAB;  Service: Cardiovascular;  Laterality: N/A;   LEFT HEART CATHETERIZATION WITH CORONARY ANGIOGRAM N/A 03/17/2013   Procedure: LEFT HEART CATHETERIZATION WITH CORONARY ANGIOGRAM;  Surgeon: Kathleene Hazel, MD; LAD mild dz, D1 branch 100%, inferior branch 99%, CFX OK, RCA 50%, EF 65%     LOOP RECORDER IMPLANT  2002   PERMANENT PACEMAKER INSERTION N/A 01/24/2013   Procedure: PERMANENT PACEMAKER INSERTION;  Surgeon: Marinus Maw, MD;  Location: [facility] CATH LAB;  Service: Cardiovascular;  Laterality: N/A;   TEE WITHOUT CARDIOVERSION N/A 06/22/2022   Procedure: TRANSESOPHAGEAL ECHOCARDIOGRAM;  Surgeon: Tonny Bollman, MD;  Location: [facility] INVASIVE CV LAB;  Service: Cardiovascular;  Laterality: N/A;   TIBIAL TUBERCLERPLASTY  ~ 2003    Prior to Admission medications   Medication Sig Start Date End Date Taking? Authorizing Provider  lisinopril (ZESTRIL) 2.5 MG tablet Take 2.5 mg by mouth daily. 01/18/23  Yes [provider]  albuterol (VENTOLIN HFA) 108 (90 Base) MCG/ACT inhaler INHALE 2 PUFFS BY MOUTH EVERY 4 HOURS AS NEEDED FOR WHEEZE OR FOR SHORTNESS OF BREATH 01/30/23   [name], NP  Ascorbic Acid (VITAMIN C) 1000 MG tablet Take 2,000 mg by mouth daily.    [provider]  aspirin EC 81 MG tablet Take 1 tablet (81 mg total) by mouth  daily. Swallow whole. 12/27/22   Glade Lloyd, MD  blood glucose meter kit and supplies KIT Dispense based on patient and insurance preference. Use up to four times daily as directed. (FOR ICD-9 250.00, 250.01). 04/08/21   [name], NP  budesonide-formoterol (SYMBICORT) 160-4.5 MCG/ACT inhaler INHALE 2 PUFFS INTO THE LUNGS TWICE A DAY Patient taking differently: Inhale 2 puffs into the lungs daily as needed (for shortness of breath and wheezing). 01/12/21   [name], NP  clopidogrel (PLAVIX) 75 MG tablet Take 75 mg by mouth daily. Patient not taking: Reported on 02/13/2023 01/18/23   [provider]  empagliflozin (JARDIANCE) 25 MG TABS tablet TAKE 1 TABLET (25 MG TOTAL) BY MOUTH DAILY BEFORE BREAKFAST. FOR DIABETES. 01/30/23   [name], NP  fluticasone (FLONASE) 50 MCG/ACT nasal spray PLACE 1 SPRAY INTO BOTH NOSTRILS DAILY AS NEEDED FOR ALLERGIES OR RHINITIS. 03/11/23   [name], NP  gabapentin (NEURONTIN) 300 MG capsule TAKE 1 CAPSULE BY MOUTH EVERY MORNING AND 2 CAPSULES BY MOUTH AT NIGHT FOR NEUROPATHY 11/28/22   [name], NP  glipiZIDE (GLUCOTROL XL) 10 MG 24 hr tablet TAKE 1 TABLET (10 MG TOTAL) BY MOUTH DAILY WITH BREAKFAST. FOR DIABETES. 11/29/22   [name], NP  glucose blood (ACCU-CHEK GUIDE) test strip TEST FOUR  TIMES A DAY 09/04/22   [name], NP  isosorbide mononitrate (IMDUR) 60 MG 24 hr tablet Take 1 tablet (60 mg total) by mouth daily. 12/20/22   Janetta Hora, PA-C  nitroGLYCERIN (NITROSTAT) 0.4 MG SL tablet Place 1 tablet (0.4 mg total) under the tongue every 5 (five) minutes as needed for chest pain. Do not exceed 3 tablets in 24 hours. 05/09/22   [name], NP  pantoprazole (PROTONIX) 40 MG tablet Take 1 tablet (40 mg total) by mouth 2 (two) times daily before a meal. For ulcer 02/13/23   Wyline Mood, MD  rosuvastatin (CRESTOR) 20 MG tablet TAKE 1 TABLET BY MOUTH EVERY DAY IN THE EVENING FOR  CHOLESTEROL 01/07/23   [name], NP  sertraline (ZOLOFT) 100 MG tablet TAKE 1 TABLET (100 MG TOTAL) BY MOUTH DAILY. FOR ANXIETY AND DEPRESSION. 01/04/23   [name], NP  tamsulosin (FLOMAX) 0.4 MG CAPS capsule Take 1 capsule (0.4 mg total) by mouth daily. 02/07/23   McGowan, Carollee Herter A, PA-C  TYLENOL 500 MG tablet Take 1,000 mg by mouth daily.    [provider]    Allergies as of 02/13/2023 - Review Complete 02/13/2023  Allergen Reaction Noted   Januvia [sitagliptin] Other (See Comments) 06/23/2021   Metformin and related Diarrhea 06/23/2021    Family History  Problem Relation Age of Onset   Alzheimer's disease Mother    Osteoporosis Mother     Social History   Socioeconomic History   Marital status: Widowed    Spouse name: Not on file   Number of children: 1   Years of education: Not on file   Highest education level: Not on file  Occupational History   Occupation: Unemployed    Employer: UNEMPLOYED  Tobacco Use   Smoking status: Former    Current packs/day: 0.00    Average packs/day: 1 pack/day for 42.0 years (42.0 ttl pk-yrs)    Types: Cigarettes    Start date: 12/31/1971    Quit date: 12/30/2013    Years since quitting: 9.2   Smokeless tobacco: Never   Tobacco comments:    Former smoker 03/20/22  Vaping Use   Vaping status: Never Used  Substance and Sexual Activity   Alcohol use: Not Currently    Comment: 03/14/2013 "stopped drinking back in 2002; never had problem w/it"   Drug use: No   Sexual activity: Not Currently  Other Topics Concern   Not on file  Social History Narrative   Single.   Retired.    1 son, deceased.    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   Enjoys playing on the computer.       Social Determinants of Health   Financial Resource Strain: Low Risk  (10/10/2022)   Overall Financial Resource Strain (CARDIA)    Difficulty of Paying Living Expenses: Not hard at all  Food  Insecurity: No Food Insecurity (12/29/2022)   Hunger Vital Sign    Worried About Running Out of Food in the Last Year: Never true    Ran Out of Food in the Last Year: Never true  Transportation Needs: No Transportation Needs (12/29/2022)   PRAPARE - Administrator, Civil Service (Medical): No    Lack of Transportation (Non-Medical): No  Physical Activity: Insufficiently Active (10/10/2022)   Exercise Vital Sign    Days of Exercise per Week: 3 days    Minutes of Exercise per Session: 30 min  Stress: No Stress Concern Present (10/10/2022)   [employer] of Occupational Health - Occupational Stress Questionnaire    Feeling of Stress : Not at all  Social Connections: Socially Isolated (10/10/2022)   Social Connection and Isolation Panel [NHANES]    Frequency of Communication with Friends and Family: More than three times a week    Frequency of Social Gatherings with Friends and Family: Three times a week    Attends Religious Services: Never    Active Member of Clubs or Organizations: No    Attends Banker Meetings: Never    Marital Status: Widowed  Intimate Partner Violence: Not At Risk (12/21/2022)   Humiliation, Afraid, Rape, and Kick questionnaire    Fear of Current or Ex-Partner: No    Emotionally Abused: No    Physically Abused: No    Sexually Abused: No    Review of Systems: See HPI, otherwise negative ROS  Physical Exam: BP 104/72   Pulse 76   Temp (!) 96.3 F (35.7 C) (Temporal)   Resp 16   Ht 5\' 11"  (1.803 m)   Wt 90.3 kg   SpO2 99%   BMI 27.75 kg/m  General:   Alert,  pleasant and cooperative in NAD Head:  Normocephalic and atraumatic. Neck:  Supple; no masses or thyromegaly. Lungs:  Clear throughout to auscultation, normal respiratory effort.    Heart:  +S1, +S2, Regular rate and rhythm, No edema. Abdomen:  Soft, nontender and nondistended. Normal bowel sounds, without guarding, and without rebound.   Neurologic:  Alert and  oriented x4;   grossly normal neurologically.  Impression/Plan: [name] Cook is here for an endoscopy  to be performed for  evaluation of duodenal ulcer.    Risks, benefits, limitations, and alternatives regarding endoscopy have been reviewed with the patient.  Questions have been answered.  All parties agreeable.   Wyline Mood, MD  03/14/2023, 9:46 AM

## 2023-03-19 ENCOUNTER — Encounter: Payer: Self-pay | Admitting: Gastroenterology

## 2023-03-23 ENCOUNTER — Ambulatory Visit: Payer: Medicare Other

## 2023-04-15 NOTE — Progress Notes (Unsigned)
Celso Amy, PA-C [address]  [address]  [place], [place] [address]  Main: [phone]  Fax: [phone]   Primary Care Physician: [name], NP  Primary Gastroenterologist:  Celso Amy, PA-C / Dr. Wyline Mood    CC:  HPI: [name] Cook is a 72 y.o. male presents for follow-up GI bleed from duodenal ulcers.  03/14/2023: EGD by Dr. Tobi Bastos: Normal.  Previous ulcers were gone.  No biopsies.  12/2022: Admitted to [facility] with severe upper GI Bleed from Duodenal Ulcer, cauterized during EGD procedures.  Multiple duodenal ulcers was seen in the second portion of the duodenum.  Patient was taken off Plavix indefinitely.  05/2021 colonoscopy by Dr. Tobi Bastos: 10 mm  tubular adenoma tubular adenoma polyp removed from ascending colon, 5 mm tubular adenoma polyp removed from sigmoid colon.  Good prep.  Biopsies negative for microscopic colitis.  3-year repeat colonoscopy recommended (05/2024).  12/2018 Colonoscopy, for colon cancer screening:  15 mm Tubular adenoma polyp was resected in the transverse colon.   Current Outpatient Medications  Medication Sig Dispense Refill   albuterol (VENTOLIN HFA) 108 (90 Base) MCG/ACT inhaler INHALE 2 PUFFS BY MOUTH EVERY 4 HOURS AS NEEDED FOR WHEEZE OR FOR SHORTNESS OF BREATH 18 each 0   Ascorbic Acid (VITAMIN C) 1000 MG tablet Take 2,000 mg by mouth daily.     aspirin EC 81 MG tablet Take 1 tablet (81 mg total) by mouth daily. Swallow whole.     blood glucose meter kit and supplies KIT Dispense based on patient and insurance preference. Use up to four times daily as directed. (FOR ICD-9 250.00, 250.01). 1 each 0   budesonide-formoterol (SYMBICORT) 160-4.5 MCG/ACT inhaler INHALE 2 PUFFS INTO THE LUNGS TWICE A DAY (Patient taking differently: Inhale 2 puffs into the lungs daily as needed (for shortness of breath and wheezing).) 10.2 each 5   clopidogrel (PLAVIX) 75 MG tablet Take 75 mg by mouth daily. (Patient not taking: Reported on  02/13/2023)     empagliflozin (JARDIANCE) 25 MG TABS tablet TAKE 1 TABLET (25 MG TOTAL) BY MOUTH DAILY BEFORE BREAKFAST. FOR DIABETES. 90 tablet 0   fluticasone (FLONASE) 50 MCG/ACT nasal spray PLACE 1 SPRAY INTO BOTH NOSTRILS DAILY AS NEEDED FOR ALLERGIES OR RHINITIS. 48 mL 0   gabapentin (NEURONTIN) 300 MG capsule TAKE 1 CAPSULE BY MOUTH EVERY MORNING AND 2 CAPSULES BY MOUTH AT NIGHT FOR NEUROPATHY 270 capsule 2   glipiZIDE (GLUCOTROL XL) 10 MG 24 hr tablet TAKE 1 TABLET (10 MG TOTAL) BY MOUTH DAILY WITH BREAKFAST. FOR DIABETES. 90 tablet 1   glucose blood (ACCU-CHEK GUIDE) test strip TEST FOUR TIMES A DAY 400 strip 0   isosorbide mononitrate (IMDUR) 60 MG 24 hr tablet Take 1 tablet (60 mg total) by mouth daily. 90 tablet 3   lisinopril (ZESTRIL) 2.5 MG tablet Take 2.5 mg by mouth daily.     nitroGLYCERIN (NITROSTAT) 0.4 MG SL tablet Place 1 tablet (0.4 mg total) under the tongue every 5 (five) minutes as needed for chest pain. Do not exceed 3 tablets in 24 hours. 25 tablet 0   pantoprazole (PROTONIX) 40 MG tablet Take 1 tablet (40 mg total) by mouth 2 (two) times daily before a meal. For ulcer 60 tablet 1   rosuvastatin (CRESTOR) 20 MG tablet TAKE 1 TABLET BY MOUTH EVERY DAY IN THE EVENING FOR CHOLESTEROL 90 tablet 2   sertraline (ZOLOFT) 100 MG tablet TAKE 1 TABLET (100 MG TOTAL) BY MOUTH DAILY.  FOR ANXIETY AND DEPRESSION. 90 tablet 0   tamsulosin (FLOMAX) 0.4 MG CAPS capsule Take 1 capsule (0.4 mg total) by mouth daily. 90 capsule 3   TYLENOL 500 MG tablet Take 1,000 mg by mouth daily.     No current facility-administered medications for this visit.    Allergies as of 04/16/2023 - Review Complete 03/14/2023  Allergen Reaction Noted   Januvia [sitagliptin] Other (See Comments) 06/23/2021   Metformin and related Diarrhea 06/23/2021    Past Medical History:  Diagnosis Date   Acute bronchitis with COPD (HCC) 03/07/2021   Acute pulmonary embolism (HCC) 03/08/2022   Arthritis    "knees  and lower back" (03/14/2013)   Atrial flutter (HCC)    radiofrequency ablation in 2001   CAD (coronary artery disease)    a. Nonobstructive. Cardiac cath in 2001-50% mid RI, normal LM, LAD, RCA b. cath 10/16/2014 95% mid RCA treated with DES, 99% ost D1 medical management due to small aneurysmal segment   Chronic anticoagulation    chronic Coumadin anticoagulation   Chronic obstructive pulmonary disease (HCC) 04/20/2011   Diabetes mellitus, type 2 (HCC)    Diaphoresis 10/26/2021   Elevated lipase 06/23/2021   GERD (gastroesophageal reflux disease)    GIB (gastrointestinal bleeding) 03/08/2022   Hyperlipidemia    Hypertension    with hypertensive heart disease   Left knee pain 10/25/2017   medial   Obesity    Persistent atrial fibrillation (HCC)    recurrent atrial flutter since 2001 s/p DCCVs, multiple failed AADs, h/o tachy-mediated cardiomyopathy   Presence of Watchman left atrial appendage closure device 06/22/2022   Watchman  31mm FLX placed with Dr. Excell Seltzer   Shortness of breath    "can come on at any time" (03/14/2013)   Sleep apnea    "dx'd; couldn't wear the mask" (03/14/2013)   Symptomatic anemia 03/08/2022   Tobacco abuse    Urinary tract infection without hematuria 05/26/2022    Past Surgical History:  Procedure Laterality Date   ATRIAL FLUTTER ABLATION  2002   atrial flutter; subsequently developed atrial fibrillation   AV NODE ABLATION  01/24/2013   CARDIAC CATHETERIZATION  2002   CARDIAC CATHETERIZATION N/A 10/16/2014   Procedure: Left Heart Cath and Coronary Angiography;  Surgeon: Tonny Bollman, MD;  Location: [facility] INVASIVE CV LAB;  Service: Cardiovascular;  Laterality: N/A;   CARDIOVERSION  05/31/2011   Procedure: CARDIOVERSION;  Surgeon: [name]. [name], MD;  Location: [facility] ORS;  Service: Cardiovascular;  Laterality: N/A;   CARPAL TUNNEL RELEASE Left 1980's   COLONOSCOPY WITH PROPOFOL N/A 12/30/2018   Procedure: COLONOSCOPY WITH PROPOFOL;  Surgeon: Wyline Mood,  MD;  Location: [facility] ENDOSCOPY;  Service: Gastroenterology;  Laterality: N/A;   COLONOSCOPY WITH PROPOFOL N/A 12/04/2019   Procedure: COLONOSCOPY WITH PROPOFOL;  Surgeon: Wyline Mood, MD;  Location: [facility] ENDOSCOPY;  Service: Gastroenterology;  Laterality: N/A;   COLONOSCOPY WITH PROPOFOL N/A 06/13/2021   Procedure: COLONOSCOPY WITH PROPOFOL;  Surgeon: Wyline Mood, MD;  Location: [facility] ENDOSCOPY;  Service: Gastroenterology;  Laterality: N/A;   ESOPHAGOGASTRODUODENOSCOPY N/A 03/01/2022   Procedure: ESOPHAGOGASTRODUODENOSCOPY (EGD);  Surgeon: Vida Rigger, MD;  Location: [facility] ENDOSCOPY;  Service: Gastroenterology;  Laterality: N/A;   ESOPHAGOGASTRODUODENOSCOPY (EGD) WITH PROPOFOL N/A 12/21/2022   Procedure: ESOPHAGOGASTRODUODENOSCOPY (EGD) WITH PROPOFOL;  Surgeon: Benancio Deeds, MD;  Location: [facility] ENDOSCOPY;  Service: Gastroenterology;  Laterality: N/A;   ESOPHAGOGASTRODUODENOSCOPY (EGD) WITH PROPOFOL N/A 12/23/2022   Procedure: ESOPHAGOGASTRODUODENOSCOPY (EGD) WITH PROPOFOL;  Surgeon: Benancio Deeds, MD;  Location: [facility] ENDOSCOPY;  Service: Gastroenterology;  Laterality: N/A;   ESOPHAGOGASTRODUODENOSCOPY (EGD) WITH PROPOFOL N/A 03/14/2023   Procedure: ESOPHAGOGASTRODUODENOSCOPY (EGD) WITH PROPOFOL;  Surgeon: Wyline Mood, MD;  Location: [facility] ENDOSCOPY;  Service: Gastroenterology;  Laterality: N/A;   GIVENS CAPSULE STUDY Left 03/10/2022   Procedure: GIVENS CAPSULE STUDY;  Surgeon: Willis Modena, MD;  Location: [facility] ENDOSCOPY;  Service: Gastroenterology;  Laterality: Left;   HEMOSTASIS CLIP PLACEMENT  12/23/2022   Procedure: HEMOSTASIS CLIP PLACEMENT;  Surgeon: Benancio Deeds, MD;  Location: [facility] ENDOSCOPY;  Service: Gastroenterology;;   HEMOSTASIS CONTROL  12/21/2022   Procedure: HEMOSTASIS CONTROL;  Surgeon: Benancio Deeds, MD;  Location: [facility] ENDOSCOPY;  Service: Gastroenterology;;   INSERT / REPLACE / REMOVE PACEMAKER  01/24/2013    Medtronic Adapta L dual-chamber pacemaker, serial number NWE  [id number]    LEFT ATRIAL APPENDAGE OCCLUSION N/A 06/22/2022   Procedure: LEFT ATRIAL APPENDAGE OCCLUSION;  Surgeon: Tonny Bollman, MD;  Location: [facility] INVASIVE CV LAB;  Service: Cardiovascular;  Laterality: N/A;   LEFT HEART CATH AND CORONARY ANGIOGRAPHY N/A 06/07/2018   Procedure: LEFT HEART CATH AND CORONARY ANGIOGRAPHY;  Surgeon: Tonny Bollman, MD;  Location: [facility] INVASIVE CV LAB;  Service: Cardiovascular;  Laterality: N/A;   LEFT HEART CATHETERIZATION WITH CORONARY ANGIOGRAM N/A 03/17/2013   Procedure: LEFT HEART CATHETERIZATION WITH CORONARY ANGIOGRAM;  Surgeon: Kathleene Hazel, MD; LAD mild dz, D1 branch 100%, inferior branch 99%, CFX OK, RCA 50%, EF 65%     LOOP RECORDER IMPLANT  2002   PERMANENT PACEMAKER INSERTION N/A 01/24/2013   Procedure: PERMANENT PACEMAKER INSERTION;  Surgeon: Marinus Maw, MD;  Location: [facility] CATH LAB;  Service: Cardiovascular;  Laterality: N/A;   TEE WITHOUT CARDIOVERSION N/A 06/22/2022   Procedure: TRANSESOPHAGEAL ECHOCARDIOGRAM;  Surgeon: Tonny Bollman, MD;  Location: [facility] INVASIVE CV LAB;  Service: Cardiovascular;  Laterality: N/A;   TIBIAL TUBERCLERPLASTY  ~ 2003    Review of Systems:    All systems reviewed and negative except where noted in HPI.   Physical Examination:   There were no vitals taken for this visit.  General: Well-nourished, well-developed in no acute distress.  Lungs: Clear to auscultation bilaterally. Non-labored. Heart: Regular rate and rhythm, no murmurs rubs or gallops.  Abdomen: Bowel sounds are normal; Abdomen is Soft; No hepatosplenomegaly, masses or hernias;  No Abdominal Tenderness; No guarding or rebound tenderness. Neuro: Alert and oriented x 3.  Grossly intact.  Psych: Alert and cooperative, normal mood and affect.   Imaging Studies: No results found.  Assessment and Plan:   [name] Cook is a 72 y.o. y/o male ***    Celso Amy, PA-C  Follow up ***  BP check *** Tubular adenoma

## 2023-04-16 ENCOUNTER — Ambulatory Visit (INDEPENDENT_AMBULATORY_CARE_PROVIDER_SITE_OTHER): Payer: 59 | Admitting: Physician Assistant

## 2023-04-16 ENCOUNTER — Encounter: Payer: Self-pay | Admitting: Physician Assistant

## 2023-04-16 VITALS — BP 94/57 | HR 93 | Temp 97.7°F | Ht 71.0 in | Wt 202.6 lb

## 2023-04-16 DIAGNOSIS — Z8711 Personal history of peptic ulcer disease: Secondary | ICD-10-CM | POA: Diagnosis not present

## 2023-04-16 DIAGNOSIS — I959 Hypotension, unspecified: Secondary | ICD-10-CM | POA: Diagnosis not present

## 2023-04-16 DIAGNOSIS — Z860101 Personal history of adenomatous and serrated colon polyps: Secondary | ICD-10-CM

## 2023-04-16 DIAGNOSIS — D649 Anemia, unspecified: Secondary | ICD-10-CM | POA: Diagnosis not present

## 2023-04-16 DIAGNOSIS — Z8719 Personal history of other diseases of the digestive system: Secondary | ICD-10-CM

## 2023-04-16 DIAGNOSIS — I952 Hypotension due to drugs: Secondary | ICD-10-CM

## 2023-04-17 ENCOUNTER — Telehealth: Payer: Self-pay

## 2023-04-17 LAB — CBC WITH DIFFERENTIAL/PLATELET
Basophils Absolute: 0 10*3/uL (ref 0.0–0.2)
Basos: 1 %
EOS (ABSOLUTE): 0.2 10*3/uL (ref 0.0–0.4)
Eos: 5 %
Hematocrit: 44.3 % (ref 37.5–51.0)
Hemoglobin: 13.7 g/dL (ref 13.0–17.7)
Immature Grans (Abs): 0 10*3/uL (ref 0.0–0.1)
Immature Granulocytes: 0 %
Lymphocytes Absolute: 1.3 10*3/uL (ref 0.7–3.1)
Lymphs: 33 %
MCH: 29 pg (ref 26.6–33.0)
MCHC: 30.9 g/dL — ABNORMAL LOW (ref 31.5–35.7)
MCV: 94 fL (ref 79–97)
Monocytes Absolute: 0.4 10*3/uL (ref 0.1–0.9)
Monocytes: 10 %
Neutrophils Absolute: 2 10*3/uL (ref 1.4–7.0)
Neutrophils: 51 %
Platelets: 130 10*3/uL — ABNORMAL LOW (ref 150–450)
RBC: 4.72 x10E6/uL (ref 4.14–5.80)
RDW: 16.4 % — ABNORMAL HIGH (ref 11.6–15.4)
WBC: 3.9 10*3/uL (ref 3.4–10.8)

## 2023-04-17 LAB — IRON,TIBC AND FERRITIN PANEL
Ferritin: 58 ng/mL (ref 30–400)
Iron Saturation: 37 % (ref 15–55)
Iron: 107 ug/dL (ref 38–169)
Total Iron Binding Capacity: 289 ug/dL (ref 250–450)
UIBC: 182 ug/dL (ref 111–343)

## 2023-04-17 LAB — B12 AND FOLATE PANEL
Folate: >20 ng/mL (ref >3.0)
Vitamin B-12: 415 pg/mL (ref 232–1245)

## 2023-04-17 NOTE — Telephone Encounter (Signed)
 Left message to return call. Call and notify patient: All of his labs have improved to normal.  This is great news!  1.  Hemoglobin has improved from 10.7 up to 13.7 in the past 2 months.  2.  Iron levels are normal.  3.  Vitamin B12 and folate are normal.  I recommend patient take a general multivitamin once daily.  He does not have to take any other vitamin supplements.  Follow-up with PCP to monitor labs in the future.  Follow-up with GI if he has any recurrent GI symptoms.  Ellouise Console, PA-C

## 2023-04-17 NOTE — Telephone Encounter (Signed)
Pt left a voicemail returning your call.

## 2023-04-17 NOTE — Progress Notes (Signed)
Call and notify patient: All of his labs have improved to normal.  This is great news! 1.  Hemoglobin has improved from 10.7 up to 13.7 in the past 2 months. 2.  Iron levels are normal. 3.  Vitamin B12 and folate are normal. I recommend patient take a general multivitamin once daily.  He does not have to take any other vitamin supplements.  Follow-up with PCP to monitor labs in the future.  Follow-up with GI if he has any recurrent GI symptoms. Celso Amy, PA-C

## 2023-04-19 ENCOUNTER — Telehealth: Payer: Self-pay

## 2023-04-19 NOTE — Telephone Encounter (Signed)
 Left message to return call. Call and notify patient: All of his labs have improved to normal.  This is great news!  1.  Hemoglobin has improved from 10.7 up to 13.7 in the past 2 months.  2.  Iron levels are normal.  3.  Vitamin B12 and folate are normal.  I recommend patient take a general multivitamin once daily.  He does not have to take any other vitamin supplements.  Follow-up with PCP to monitor labs in the future.  Follow-up with GI if he has any recurrent GI symptoms.  Ellouise Console, PA-C   Patient notified.

## 2023-04-19 NOTE — Telephone Encounter (Signed)
 Left message to return call to office.

## 2023-04-28 ENCOUNTER — Other Ambulatory Visit: Payer: Self-pay | Admitting: Primary Care

## 2023-04-28 DIAGNOSIS — F419 Anxiety disorder, unspecified: Secondary | ICD-10-CM

## 2023-04-30 ENCOUNTER — Ambulatory Visit (INDEPENDENT_AMBULATORY_CARE_PROVIDER_SITE_OTHER): Payer: 59

## 2023-04-30 DIAGNOSIS — I442 Atrioventricular block, complete: Secondary | ICD-10-CM

## 2023-05-01 ENCOUNTER — Encounter: Payer: Self-pay | Admitting: Primary Care

## 2023-05-01 ENCOUNTER — Ambulatory Visit: Payer: 59 | Admitting: Primary Care

## 2023-05-01 VITALS — BP 118/64 | HR 82 | Temp 97.2°F | Ht 71.0 in | Wt 205.0 lb

## 2023-05-01 DIAGNOSIS — E1165 Type 2 diabetes mellitus with hyperglycemia: Secondary | ICD-10-CM

## 2023-05-01 DIAGNOSIS — Z7984 Long term (current) use of oral hypoglycemic drugs: Secondary | ICD-10-CM | POA: Diagnosis not present

## 2023-05-01 LAB — POCT GLYCOSYLATED HEMOGLOBIN (HGB A1C): Hemoglobin A1C: 5.5 % (ref 4.0–5.6)

## 2023-05-01 MED ORDER — GLIPIZIDE ER 5 MG PO TB24: 5.0000 mg | ORAL_TABLET | Freq: Every day | ORAL | 1 refills | Status: DC

## 2023-05-01 NOTE — Patient Instructions (Addendum)
 Your diabetes levels have improved significantly.  We reduced the dose of your glipizide  XL medication to 5 mg once daily.  I sent a new prescription to [facility] today.  Do not take glipizide  XL 10 mg any longer.  Continue to take Jardiance  25 mg once daily for diabetes.  Please schedule a physical to meet with me in 6 months.   It was a pleasure to see you today!

## 2023-05-01 NOTE — Progress Notes (Signed)
 Subjective:    Patient ID: [name] Cook, male    DOB: [birth year], 73 y.o.   MRN: [id number]  HPI  [name] Cook is a very pleasant 73 y.o. male with a history of hypertension, CAD, atrial flutter, atrial fibrillation, OSA, COPD, type 2 diabetes, CKD, prostate cancer, GI bleed who presents today for follow-up of diabetes.  Current medications include: Glipizide  XL 10 mg daily, Jardiance  25 mg daily.  He denies feeling jittery or that his glucose is dropping.   He is checking his blood glucose sometime and is getting readings of:  AM fasting: 60s-120s   Last A1C: 6.3 in July 2024, 5.5 today Last Eye Exam: UTD Last Foot Exam: Due Pneumonia Vaccination: 2019 Urine Microalbumin: UTD Statin: rosuvastatin    Dietary changes since last visit: Increased intake of veggies months ago.    Exercise: Walking daily   Wt Readings from Last 3 Encounters:  05/01/23 205 lb (93 kg)  04/16/23 202 lb 9.6 oz (91.9 kg)  03/14/23 199 lb (90.3 kg)       Review of Systems  Eyes:  Negative for visual disturbance.  Respiratory:  Negative for shortness of breath.   Cardiovascular:  Negative for chest pain.  Neurological:  Positive for numbness.         Past Medical History:  Diagnosis Date   Acute bronchitis with COPD (HCC) 03/07/2021   Acute pulmonary embolism (HCC) 03/08/2022   Arthritis    knees and lower back (03/14/2013)   Atrial flutter (HCC)    radiofrequency ablation in 2001   CAD (coronary artery disease)    a. Nonobstructive. Cardiac cath in 2001-50% mid RI, normal LM, LAD, RCA b. cath 10/16/2014 95% mid RCA treated with DES, 99% ost D1 medical management due to small aneurysmal segment   Chronic anticoagulation    chronic Coumadin  anticoagulation   Chronic obstructive pulmonary disease (HCC) 04/20/2011   Diabetes mellitus, type 2 (HCC)    Diaphoresis 10/26/2021   Elevated lipase 06/23/2021   GERD (gastroesophageal reflux disease)    GIB (gastrointestinal bleeding)  03/08/2022   Hyperlipidemia    Hypertension    with hypertensive heart disease   Left knee pain 10/25/2017   medial   Obesity    Persistent atrial fibrillation (HCC)    recurrent atrial flutter since 2001 s/p DCCVs, multiple failed AADs, h/o tachy-mediated cardiomyopathy   Presence of Watchman left atrial appendage closure device 06/22/2022   Watchman  31mm FLX placed with Dr. Wonda   Shortness of breath    can come on at any time (03/14/2013)   Sleep apnea    dx'd; couldn't wear the mask (03/14/2013)   Symptomatic anemia 03/08/2022   Tobacco abuse    Urinary tract infection without hematuria 05/26/2022    Social History   Socioeconomic History   Marital status: Widowed    Spouse name: Not on file   Number of children: 1   Years of education: Not on file   Highest education level: Not on file  Occupational History   Occupation: Unemployed    Employer: UNEMPLOYED  Tobacco Use   Smoking status: Former    Current packs/day: 0.00    Average packs/day: 1 pack/day for 42.0 years (42.0 ttl pk-yrs)    Types: Cigarettes    Start date: 12/31/1971    Quit date: 12/30/2013    Years since quitting: 9.3   Smokeless tobacco: Never   Tobacco comments:    Former smoker 03/20/22  Vaping Use   Vaping  status: Never Used  Substance and Sexual Activity   Alcohol use: Not Currently    Comment: 03/14/2013 stopped drinking back in 2002; never had problem w/it   Drug use: No   Sexual activity: Not Currently  Other Topics Concern   Not on file  Social History Narrative   Single.   Retired.    1 son, deceased.    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   Enjoys playing on the computer.       Social Drivers of Corporate Investment Banker Strain: Low Risk  (10/10/2022)   Overall Financial Resource Strain (CARDIA)    Difficulty of Paying Living Expenses: Not hard at all  Food Insecurity: No Food Insecurity (12/29/2022)   Hunger Vital Sign     Worried About Running Out of Food in the Last Year: Never true    Ran Out of Food in the Last Year: Never true  Transportation Needs: No Transportation Needs (12/29/2022)   PRAPARE - Administrator, Civil Service (Medical): No    Lack of Transportation (Non-Medical): No  Physical Activity: Insufficiently Active (10/10/2022)   Exercise Vital Sign    Days of Exercise per Week: 3 days    Minutes of Exercise per Session: 30 min  Stress: No Stress Concern Present (10/10/2022)   [employer] of Occupational Health - Occupational Stress Questionnaire    Feeling of Stress : Not at all  Social Connections: Socially Isolated (10/10/2022)   Social Connection and Isolation Panel [NHANES]    Frequency of Communication with Friends and Family: More than three times a week    Frequency of Social Gatherings with Friends and Family: Three times a week    Attends Religious Services: Never    Active Member of Clubs or Organizations: No    Attends Banker Meetings: Never    Marital Status: Widowed  Intimate Partner Violence: Not At Risk (12/21/2022)   Humiliation, Afraid, Rape, and Kick questionnaire    Fear of Current or Ex-Partner: No    Emotionally Abused: No    Physically Abused: No    Sexually Abused: No    Past Surgical History:  Procedure Laterality Date   ATRIAL FLUTTER ABLATION  2002   atrial flutter; subsequently developed atrial fibrillation   AV NODE ABLATION  01/24/2013   CARDIAC CATHETERIZATION  2002   CARDIAC CATHETERIZATION N/A 10/16/2014   Procedure: Left Heart Cath and Coronary Angiography;  Surgeon: Ozell Fell, MD;  Location: [facility] INVASIVE CV LAB;  Service: Cardiovascular;  Laterality: N/A;   CARDIOVERSION  05/31/2011   Procedure: CARDIOVERSION;  Surgeon: Lamar HERO. Juventino, MD;  Location: [facility] ORS;  Service: Cardiovascular;  Laterality: N/A;   CARPAL TUNNEL RELEASE Left 1980's   COLONOSCOPY WITH PROPOFOL  N/A 12/30/2018   Procedure: COLONOSCOPY WITH  PROPOFOL ;  Surgeon: Therisa Bi, MD;  Location: [facility] ENDOSCOPY;  Service: Gastroenterology;  Laterality: N/A;   COLONOSCOPY WITH PROPOFOL  N/A 12/04/2019   Procedure: COLONOSCOPY WITH PROPOFOL ;  Surgeon: Therisa Bi, MD;  Location: [facility] ENDOSCOPY;  Service: Gastroenterology;  Laterality: N/A;   COLONOSCOPY WITH PROPOFOL  N/A 06/13/2021   Procedure: COLONOSCOPY WITH PROPOFOL ;  Surgeon: Therisa Bi, MD;  Location: [facility] ENDOSCOPY;  Service: Gastroenterology;  Laterality: N/A;   ESOPHAGOGASTRODUODENOSCOPY N/A 03/01/2022   Procedure: ESOPHAGOGASTRODUODENOSCOPY (EGD);  Surgeon: Rosalie Kitchens, MD;  Location: [facility] ENDOSCOPY;  Service: Gastroenterology;  Laterality: N/A;   ESOPHAGOGASTRODUODENOSCOPY (EGD) WITH PROPOFOL  N/A 12/21/2022   Procedure: ESOPHAGOGASTRODUODENOSCOPY (EGD) WITH PROPOFOL ;  Surgeon:  Armbruster, Elspeth SQUIBB, MD;  Location: [facility] ENDOSCOPY;  Service: Gastroenterology;  Laterality: N/A;   ESOPHAGOGASTRODUODENOSCOPY (EGD) WITH PROPOFOL  N/A 12/23/2022   Procedure: ESOPHAGOGASTRODUODENOSCOPY (EGD) WITH PROPOFOL ;  Surgeon: Leigh Elspeth SQUIBB, MD;  Location: [facility] ENDOSCOPY;  Service: Gastroenterology;  Laterality: N/A;   ESOPHAGOGASTRODUODENOSCOPY (EGD) WITH PROPOFOL  N/A 03/14/2023   Procedure: ESOPHAGOGASTRODUODENOSCOPY (EGD) WITH PROPOFOL ;  Surgeon: Therisa Bi, MD;  Location: [facility] ENDOSCOPY;  Service: Gastroenterology;  Laterality: N/A;   GIVENS CAPSULE STUDY Left 03/10/2022   Procedure: GIVENS CAPSULE STUDY;  Surgeon: Burnette Fallow, MD;  Location: [facility] ENDOSCOPY;  Service: Gastroenterology;  Laterality: Left;   HEMOSTASIS CLIP PLACEMENT  12/23/2022   Procedure: HEMOSTASIS CLIP PLACEMENT;  Surgeon: Leigh Elspeth SQUIBB, MD;  Location: [facility] ENDOSCOPY;  Service: Gastroenterology;;   HEMOSTASIS CONTROL  12/21/2022   Procedure: HEMOSTASIS CONTROL;  Surgeon: Leigh Elspeth SQUIBB, MD;  Location: [facility] ENDOSCOPY;  Service: Gastroenterology;;   INSERT / REPLACE / REMOVE PACEMAKER  01/24/2013    Medtronic Adapta L  dual-chamber pacemaker, serial number [id number]    LEFT ATRIAL APPENDAGE OCCLUSION N/A 06/22/2022   Procedure: LEFT ATRIAL APPENDAGE OCCLUSION;  Surgeon: Wonda Sharper, MD;  Location: [facility] INVASIVE CV LAB;  Service: Cardiovascular;  Laterality: N/A;   LEFT HEART CATH AND CORONARY ANGIOGRAPHY N/A 06/07/2018   Procedure: LEFT HEART CATH AND CORONARY ANGIOGRAPHY;  Surgeon: Wonda Sharper, MD;  Location: [facility] INVASIVE CV LAB;  Service: Cardiovascular;  Laterality: N/A;   LEFT HEART CATHETERIZATION WITH CORONARY ANGIOGRAM N/A 03/17/2013   Procedure: LEFT HEART CATHETERIZATION WITH CORONARY ANGIOGRAM;  Surgeon: Lonni JONETTA Cash, MD; LAD mild dz, D1 branch 100%, inferior branch 99%, CFX OK, RCA 50%, EF 65%     LOOP RECORDER IMPLANT  2002   PERMANENT PACEMAKER INSERTION N/A 01/24/2013   Procedure: PERMANENT PACEMAKER INSERTION;  Surgeon: Danelle LELON Birmingham, MD;  Location: [facility] CATH LAB;  Service: Cardiovascular;  Laterality: N/A;   TEE WITHOUT CARDIOVERSION N/A 06/22/2022   Procedure: TRANSESOPHAGEAL ECHOCARDIOGRAM;  Surgeon: Wonda Sharper, MD;  Location: [facility] INVASIVE CV LAB;  Service: Cardiovascular;  Laterality: N/A;   TIBIAL TUBERCLERPLASTY  ~ 2003    Family History  Problem Relation Age of Onset   Alzheimer's disease Mother    Osteoporosis Mother     Allergies  Allergen Reactions   Januvia  [Sitagliptin ] Other (See Comments)    Elevated lipase   Metformin  And Related Diarrhea    Current Outpatient Medications on File Prior to Visit  Medication Sig Dispense Refill   albuterol  (VENTOLIN  HFA) 108 (90 Base) MCG/ACT inhaler INHALE 2 PUFFS BY MOUTH EVERY 4 HOURS AS NEEDED FOR WHEEZE OR FOR SHORTNESS OF BREATH 18 each 0   Ascorbic Acid (VITAMIN C) 1000 MG tablet Take 2,000 mg by mouth daily.     aspirin  EC 81 MG tablet Take 1 tablet (81 mg total) by mouth daily. Swallow whole.     blood glucose meter kit and supplies KIT Dispense based on patient and insurance preference. Use up to four times daily  as directed. (FOR ICD-9 250.00, 250.01). 1 each 0   budesonide -formoterol  (SYMBICORT ) 160-4.5 MCG/ACT inhaler INHALE 2 PUFFS INTO THE LUNGS TWICE A DAY (Patient taking differently: Inhale 2 puffs into the lungs daily as needed (for shortness of breath and wheezing).) 10.2 each 5   clopidogrel  (PLAVIX ) 75 MG tablet Take 75 mg by mouth daily.     empagliflozin  (JARDIANCE ) 25 MG TABS tablet TAKE 1 TABLET (25 MG TOTAL) BY MOUTH DAILY BEFORE BREAKFAST. FOR DIABETES. 90 tablet 0  fluticasone  (FLONASE ) 50 MCG/ACT nasal spray PLACE 1 SPRAY INTO BOTH NOSTRILS DAILY AS NEEDED FOR ALLERGIES OR RHINITIS. 48 mL 0   gabapentin  (NEURONTIN ) 300 MG capsule TAKE 1 CAPSULE BY MOUTH EVERY MORNING AND 2 CAPSULES BY MOUTH AT NIGHT FOR NEUROPATHY 270 capsule 2   glucose blood (ACCU-CHEK GUIDE) test strip TEST FOUR TIMES A DAY 400 strip 0   isosorbide  mononitrate (IMDUR ) 60 MG 24 hr tablet Take 1 tablet (60 mg total) by mouth daily. 90 tablet 3   lisinopril  (ZESTRIL ) 2.5 MG tablet Take 2.5 mg by mouth daily.     nitroGLYCERIN  (NITROSTAT ) 0.4 MG SL tablet Place 1 tablet (0.4 mg total) under the tongue every 5 (five) minutes as needed for chest pain. Do not exceed 3 tablets in 24 hours. 25 tablet 0   pantoprazole  (PROTONIX ) 40 MG tablet Take 1 tablet (40 mg total) by mouth 2 (two) times daily before a meal. For ulcer 60 tablet 1   rosuvastatin  (CRESTOR ) 20 MG tablet TAKE 1 TABLET BY MOUTH EVERY DAY IN THE EVENING FOR CHOLESTEROL 90 tablet 2   sertraline  (ZOLOFT ) 100 MG tablet TAKE 1 TABLET (100 MG TOTAL) BY MOUTH DAILY. FOR ANXIETY AND DEPRESSION. 90 tablet 1   tamsulosin  (FLOMAX ) 0.4 MG CAPS capsule Take 1 capsule (0.4 mg total) by mouth daily. 90 capsule 3   TYLENOL  500 MG tablet Take 1,000 mg by mouth daily.     No current facility-administered medications on file prior to visit.    BP 118/64   Pulse 82   Temp (!) 97.2 F (36.2 C) (Temporal)   Ht 5' 11 (1.803 m)   Wt 205 lb (93 kg)   SpO2 95%   BMI 28.59 kg/m   Objective:   Physical Exam Cardiovascular:     Rate and Rhythm: Normal rate and regular rhythm.  Pulmonary:     Effort: Pulmonary effort is normal.     Breath sounds: Normal breath sounds.  Musculoskeletal:     Cervical back: Neck supple.  Skin:    General: Skin is warm and dry.  Neurological:     Mental Status: He is alert and oriented to person, place, and time.  Psychiatric:        Mood and Affect: Mood normal.           Assessment & Plan:  Type 2 diabetes mellitus with hyperglycemia, without long-term current use of insulin  (HCC) Assessment & Plan: Improved and at goal with A1C of 5.5 today!  Reduce glipizide  XL 5 mg daily to avoid hypoglycemia.  Continue Jardiance  25 mg daily.  Continue to work on diet.   Foot exam today.   Follow up in 6 months.  Orders: -     POCT glycosylated hemoglobin (Hb A1C) -     glipiZIDE  ER; Take 1 tablet (5 mg total) by mouth daily with breakfast. for diabetes.  Dispense: 90 tablet; Refill: 1        Comer MARLA Gaskins, NP

## 2023-05-01 NOTE — Assessment & Plan Note (Signed)
 Improved and at goal with A1C of 5.5 today!  Reduce glipizide XL 5 mg daily to avoid hypoglycemia.  Continue Jardiance 25 mg daily.  Continue to work on diet.   Foot exam today.   Follow up in 6 months.

## 2023-05-04 LAB — CUP PACEART REMOTE DEVICE CHECK
Battery Impedance: 1599 Ohm
Battery Remaining Longevity: 46 mo
Battery Voltage: 2.75 V
Brady Statistic RV Percent Paced: 100 %
Date Time Interrogation Session: 20250116145631
Implantable Lead Connection Status: 753985
Implantable Lead Connection Status: 753985
Implantable Lead Implant Date: 20141010
Implantable Lead Implant Date: 20141010
Implantable Lead Location: 753859
Implantable Lead Location: 753860
Implantable Lead Model: 5076
Implantable Lead Model: 5076
Implantable Pulse Generator Implant Date: 20141010
Lead Channel Impedance Value: 557 Ohm
Lead Channel Impedance Value: 67 Ohm
Lead Channel Pacing Threshold Amplitude: 0.75 V
Lead Channel Pacing Threshold Pulse Width: 0.4 ms
Lead Channel Setting Pacing Amplitude: 2.5 V
Lead Channel Setting Pacing Pulse Width: 0.4 ms
Lead Channel Setting Sensing Sensitivity: 4 mV
Zone Setting Status: 755011
Zone Setting Status: 755011

## 2023-05-07 ENCOUNTER — Encounter: Payer: Self-pay | Admitting: Internal Medicine

## 2023-05-10 ENCOUNTER — Inpatient Hospital Stay: Admission: RE | Admit: 2023-05-10 | Payer: 59 | Source: Ambulatory Visit

## 2023-05-13 ENCOUNTER — Other Ambulatory Visit: Payer: Self-pay | Admitting: Primary Care

## 2023-05-13 ENCOUNTER — Other Ambulatory Visit: Payer: Self-pay | Admitting: Cardiology

## 2023-05-13 DIAGNOSIS — J439 Emphysema, unspecified: Secondary | ICD-10-CM

## 2023-05-13 DIAGNOSIS — K269 Duodenal ulcer, unspecified as acute or chronic, without hemorrhage or perforation: Secondary | ICD-10-CM

## 2023-05-13 DIAGNOSIS — R251 Tremor, unspecified: Secondary | ICD-10-CM

## 2023-05-13 DIAGNOSIS — E1165 Type 2 diabetes mellitus with hyperglycemia: Secondary | ICD-10-CM

## 2023-05-14 MED ORDER — PANTOPRAZOLE SODIUM 40 MG PO TBEC: 40.0000 mg | DELAYED_RELEASE_TABLET | Freq: Every day | ORAL | 1 refills | Status: DC

## 2023-05-14 NOTE — Telephone Encounter (Signed)
[name] Cook, ,I received a refill request for this patient's pantoprazole. Should be still be taking this twice daily or only once daily? Your most recent notes say daily so I just wanted to be sure, thanks!

## 2023-05-14 NOTE — Telephone Encounter (Signed)
Please call patient:  Let  him know that I spoke with GI and we need to reduce his pantoprazole to 40 mg once daily. This was for his stomach ulcer.  I sent a new prescription.

## 2023-05-15 NOTE — Telephone Encounter (Signed)
Unable to reach patient. Left voicemail to return call to our office.

## 2023-05-17 NOTE — Telephone Encounter (Signed)
Called patient and reviewed all information. Patient verbalized understanding. Will call if any further questions.

## 2023-05-29 ENCOUNTER — Emergency Department (HOSPITAL_COMMUNITY)
Admission: EM | Admit: 2023-05-29 | Discharge: 2023-05-29 | Discharge status: Against Medical Advice | Payer: 59 | Attending: Emergency Medicine | Admitting: Emergency Medicine

## 2023-05-29 ENCOUNTER — Other Ambulatory Visit: Payer: Self-pay

## 2023-05-29 ENCOUNTER — Ambulatory Visit: Payer: Self-pay | Admitting: Primary Care

## 2023-05-29 ENCOUNTER — Encounter (HOSPITAL_COMMUNITY): Payer: Self-pay

## 2023-05-29 DIAGNOSIS — K921 Melena: Secondary | ICD-10-CM | POA: Insufficient documentation

## 2023-05-29 DIAGNOSIS — R109 Unspecified abdominal pain: Secondary | ICD-10-CM | POA: Insufficient documentation

## 2023-05-29 DIAGNOSIS — Z5321 Procedure and treatment not carried out due to patient leaving prior to being seen by health care provider: Secondary | ICD-10-CM | POA: Insufficient documentation

## 2023-05-29 LAB — COMPREHENSIVE METABOLIC PANEL
ALT: 22 U/L (ref 0–44)
AST: 25 U/L (ref 15–41)
Albumin: 4.2 g/dL (ref 3.5–5.0)
Alkaline Phosphatase: 45 U/L (ref 38–126)
Anion gap: 10 (ref 5–15)
BUN: 19 mg/dL (ref 8–23)
CO2: 25 mmol/L (ref 22–32)
Calcium: 9.7 mg/dL (ref 8.9–10.3)
Chloride: 103 mmol/L (ref 98–111)
Creatinine, Ser: 1.19 mg/dL (ref 0.61–1.24)
GFR, Estimated: >60 mL/min (ref >60)
Glucose, Bld: 81 mg/dL (ref 70–99)
Potassium: 4.2 mmol/L (ref 3.5–5.1)
Sodium: 138 mmol/L (ref 135–145)
Total Bilirubin: 0.8 mg/dL (ref 0.0–1.2)
Total Protein: 6.5 g/dL (ref 6.5–8.1)

## 2023-05-29 LAB — CBC WITH DIFFERENTIAL/PLATELET
Abs Immature Granulocytes: 0.01 10*3/uL (ref 0.00–0.07)
Basophils Absolute: 0 10*3/uL (ref 0.0–0.1)
Basophils Relative: 1 %
Eosinophils Absolute: 0.1 10*3/uL (ref 0.0–0.5)
Eosinophils Relative: 3 %
HCT: 46.1 % (ref 39.0–52.0)
Hemoglobin: 15.2 g/dL (ref 13.0–17.0)
Immature Granulocytes: 0 %
Lymphocytes Relative: 37 %
Lymphs Abs: 1.6 10*3/uL (ref 0.7–4.0)
MCH: 31.5 pg (ref 26.0–34.0)
MCHC: 33 g/dL (ref 30.0–36.0)
MCV: 95.4 fL (ref 80.0–100.0)
Monocytes Absolute: 0.3 10*3/uL (ref 0.1–1.0)
Monocytes Relative: 8 %
Neutro Abs: 2.2 10*3/uL (ref 1.7–7.7)
Neutrophils Relative %: 51 %
Platelets: 111 10*3/uL — ABNORMAL LOW (ref 150–400)
RBC: 4.83 MIL/uL (ref 4.22–5.81)
RDW: 16.1 % — ABNORMAL HIGH (ref 11.5–15.5)
WBC: 4.3 10*3/uL (ref 4.0–10.5)
nRBC: 0 % (ref 0.0–0.2)

## 2023-05-29 LAB — TYPE AND SCREEN
ABO/RH(D): O POS
Antibody Screen: NEGATIVE

## 2023-05-29 NOTE — Telephone Encounter (Signed)
Noted, will await ED notes.

## 2023-05-29 NOTE — Telephone Encounter (Signed)
Chief Complaint: Blood in stool Symptoms: Weak, Confused, loose stool Frequency: 1 week Pertinent Negatives: Patient denies constipation currently, abdomen pain, fever Disposition: [x] ED /[] Urgent Care (no appt availability in office) / [] Appointment(In office/virtual)/ []  [place] [facility]/ [] Home Care/ [] Refused Recommended Disposition /[] [place] [facility]/ []  Follow-up with PCP Additional Notes: Pt states his stool is "real dark" for about a week. He is not sure if it is blood. This has happened before when he was bleeding. Pt is weak and his head feels funny. Pt states he feels confused. Pt states his stool today was real loose. Pt advised to go to ED. Pt states he has someone available to drive him there now.  Pt verbalized understanding and agrees to plan.    Copied from CRM [id number]. Topic: Clinical - Red Word Triage >> May 29, 2023  1:13 PM Tiffany H wrote: [facility] that prompted transfer to Nurse Triage: BLEEDING. Patient called to advise that he has a dark, loose stool, he feels weak and he has some stomach pain. Please advise. Reason for Disposition  Black or tarry bowel movements  (Exception: Chronic-unchanged black-grey BMs AND is taking iron pills or Pepto-Bismol.)  Answer Assessment - Initial Assessment Questions 1. APPEARANCE of BLOOD: "What color is it?" "Is it passed separately, on the surface of the stool, or mixed in with the stool?"  Dark brown/black 3. FREQUENCY: "How many times has blood been passed with the stools?"      1 week of stools this color 4. ONSET: "When was the blood first seen in the stools?" (Days or weeks)      1 week ago 5. DIARRHEA: "Is there also some diarrhea?" If Yes, ask: "How many diarrhea stools in the past 24 hours?"      Denies, thicker than this 6. CONSTIPATION: "Do you have constipation?" If Yes, ask: "How bad is it?"     Yes hx of this 7. RECURRENT SYMPTOMS: "Have you had blood in your stools before?" If Yes, ask: "When was the  last time?" and "What happened that time?"      Yes, 4-5 months ago. Pt had a bleeding ulcer 8. BLOOD THINNERS: "Do you take any blood thinners?" (e.g., Coumadin/warfarin, Pradaxa/dabigatran, aspirin)     Aspirin 81 mg 9. OTHER SYMPTOMS: "Do you have any other symptoms?"  (e.g., abdomen pain, vomiting, dizziness, fever)     Sometimes dizziness when stands up- this is not a new symptom  Protocols used: Rectal Bleeding-A-AH

## 2023-05-29 NOTE — ED Triage Notes (Signed)
Reports dark stools x 1 week with abd pain.  Patient is on a blood thinner.

## 2023-05-29 NOTE — ED Notes (Signed)
Called patient name 3x no answer

## 2023-05-30 ENCOUNTER — Ambulatory Visit: Payer: Self-pay | Admitting: Primary Care

## 2023-05-30 ENCOUNTER — Other Ambulatory Visit: Payer: Self-pay | Admitting: Internal Medicine

## 2023-05-30 ENCOUNTER — Other Ambulatory Visit: Payer: Self-pay | Admitting: Primary Care

## 2023-05-30 DIAGNOSIS — E785 Hyperlipidemia, unspecified: Secondary | ICD-10-CM

## 2023-05-30 DIAGNOSIS — J439 Emphysema, unspecified: Secondary | ICD-10-CM

## 2023-05-30 DIAGNOSIS — E1165 Type 2 diabetes mellitus with hyperglycemia: Secondary | ICD-10-CM

## 2023-05-30 DIAGNOSIS — R251 Tremor, unspecified: Secondary | ICD-10-CM

## 2023-05-30 NOTE — Telephone Encounter (Signed)
Copied from CRM [id number]. Topic: Clinical - Red Word Triage >> May 30, 2023  2:49 PM Adele Barthel wrote: Red Word that prompted transfer to Nurse Triage: Dark stools for 1 week. Went to ER yesterday and had lab work completed but left due to waiting for 5 hours. Lab results have returned and are in chart.   No other symptoms with dark stools.   Is currently taking a blood thinner. History of blood transfusions.   Chief Complaint: Dark colored stool Symptoms: Dark colored stool x1 week, some dizziness yesterday that is resolved Frequency: Every BM Pertinent Negatives: Patient denies weakness, current dizziness  Disposition: [] ED /[] Urgent Care (no appt availability in office) / [x] Appointment(In office/virtual)/ []  [place] [facility]/ [] Home Care/ [] Refused Recommended Disposition /[] [place] [facility]/ []  Follow-up with PCP Additional Notes: Patient reports that he called yesterday about his dark colored stools and was instructed to go to the ED. Patient states he left due to the wait but did have his labs drawn and wanted to see if there was any evidence of bleed. I advised that patient that his H&H are within normal limits and offered an office visit for follow up and further evaluation. Patient agreeable with this plan. Patient denies any current symptoms outside of the dark colored stool. Patient reports also taking an iron pill for the last month.      Reason for Disposition  Unusual stool color probably from food or medicine  Answer Assessment - Initial Assessment Questions 1. COLOR: "What color is it?" "Is that color in part or all of the stool?"     Black 2. ONSET: "When was the unusual color first noted?"     1 week ago 3. CAUSE: "Have you eaten any food or taken any medicine of this color?" Note: See listing in Background Information section.      Taking iron pills 4. OTHER SYMPTOMS: "Do you have any other symptoms?" (e.g., abdomen pain, diarrhea, jaundice, fever).      Dizziness yesterday, none today  Protocols used: Stools - Unusual Color-A-AH

## 2023-05-30 NOTE — Telephone Encounter (Signed)
Noted, will evaluate.

## 2023-06-01 ENCOUNTER — Ambulatory Visit: Payer: 59 | Admitting: Primary Care

## 2023-06-01 ENCOUNTER — Encounter: Payer: Self-pay | Admitting: Primary Care

## 2023-06-01 VITALS — BP 110/72 | HR 99 | Temp 97.2°F | Ht 71.0 in | Wt 206.0 lb

## 2023-06-01 DIAGNOSIS — G4733 Obstructive sleep apnea (adult) (pediatric): Secondary | ICD-10-CM | POA: Diagnosis not present

## 2023-06-01 DIAGNOSIS — R195 Other fecal abnormalities: Secondary | ICD-10-CM | POA: Insufficient documentation

## 2023-06-01 LAB — HEMOCCULT GUIAC POC 1CARD (OFFICE): Fecal Occult Blood, POC: NEGATIVE

## 2023-06-01 NOTE — Progress Notes (Signed)
Subjective:    Patient ID: [name] Cook, male    DOB: 30-Dec-1950, 73 y.o.   MRN: [id number]  HPI  [name] Cook is a very pleasant 73 y.o. male with a history of GI bleed, GERD, duodenal ulcer, type 2 diabetes, CKD, hypertension, CAD, pacemaker, OSA, COPD who presents today to discuss dark tarry stools.  He contacted our office on 05/29/2023 with reports of dark stools x 1 week.  He also discussed symptoms of weakness, confusion, loose stools.  The triage nursing team referred him to the emergency department for further evaluation.  He presented to [facility] ED on 05/29/2023 as directed, left without being seen due to the long wait time. His CBC was without anemia, other labs unremarkable.   He continues to experience dark stools.Marland Kitchen He is managed on ferrous sulfate once daily, has been taking for months.   He denies bright red blood in the stool, decreased appetite, palpitations. He is experiencing one bowel movement daily, last one was this morning. He feels dizzy when bending forward and leaning up, or when trying to get up from his knees. He is compliant to his pantoprazole 40 mg daily.   He continues to feel tired during the day. Doesn't have a CPAP machine as his girlfriend could not stand the noise. He is interested in the [place] implant.   BP Readings from Last 3 Encounters:  06/01/23 110/72  05/29/23 111/77  05/01/23 118/64     Review of Systems  Respiratory:  Negative for shortness of breath.   Cardiovascular:  Negative for chest pain.  Gastrointestinal:  Positive for diarrhea. Negative for abdominal pain and constipation.       Dark stools  Neurological:  Positive for weakness. Negative for dizziness.  Psychiatric/Behavioral:  Positive for sleep disturbance.          Past Medical History:  Diagnosis Date   Acute bronchitis with COPD (HCC) 03/07/2021   Acute pulmonary embolism (HCC) 03/08/2022   Arthritis    "knees and lower back" (03/14/2013)   Atrial flutter (HCC)     radiofrequency ablation in 2001   CAD (coronary artery disease)    a. Nonobstructive. Cardiac cath in 2001-50% mid RI, normal LM, LAD, RCA b. cath 10/16/2014 95% mid RCA treated with DES, 99% ost D1 medical management due to small aneurysmal segment   Chronic anticoagulation    chronic Coumadin anticoagulation   Chronic obstructive pulmonary disease (HCC) 04/20/2011   Diabetes mellitus, type 2 (HCC)    Diaphoresis 10/26/2021   Elevated lipase 06/23/2021   GERD (gastroesophageal reflux disease)    GIB (gastrointestinal bleeding) 03/08/2022   Hyperlipidemia    Hypertension    with hypertensive heart disease   Left knee pain 10/25/2017   medial   Obesity    Persistent atrial fibrillation (HCC)    recurrent atrial flutter since 2001 s/p DCCVs, multiple failed AADs, h/o tachy-mediated cardiomyopathy   Presence of Watchman left atrial appendage closure device 06/22/2022   Watchman  31mm FLX placed with Dr. Excell Seltzer   Shortness of breath    "can come on at any time" (03/14/2013)   Sleep apnea    "dx'd; couldn't wear the mask" (03/14/2013)   Symptomatic anemia 03/08/2022   Tobacco abuse    Urinary tract infection without hematuria 05/26/2022    Social History   Socioeconomic History   Marital status: Widowed    Spouse name: Not on file   Number of children: 1   Years of education: Not on  file   Highest education level: Not on file  Occupational History   Occupation: Unemployed    Employer: UNEMPLOYED  Tobacco Use   Smoking status: Former    Current packs/day: 0.00    Average packs/day: 1 pack/day for 42.0 years (42.0 ttl pk-yrs)    Types: Cigarettes    Start date: 12/31/1971    Quit date: 12/30/2013    Years since quitting: 9.4   Smokeless tobacco: Never   Tobacco comments:    Former smoker 03/20/22  Vaping Use   Vaping status: Never Used  Substance and Sexual Activity   Alcohol use: Not Currently    Comment: 03/14/2013 "stopped drinking back in 2002; never had problem w/it"    Drug use: No   Sexual activity: Not Currently  Other Topics Concern   Not on file  Social History Narrative   Single.   Retired.    1 son, deceased.    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   Enjoys playing on the computer.       Social Drivers of Corporate investment banker Strain: Low Risk  (10/10/2022)   Overall Financial Resource Strain (CARDIA)    Difficulty of Paying Living Expenses: Not hard at all  Food Insecurity: No Food Insecurity (12/29/2022)   Hunger Vital Sign    Worried About Running Out of Food in the Last Year: Never true    Ran Out of Food in the Last Year: Never true  Transportation Needs: No Transportation Needs (12/29/2022)   PRAPARE - Administrator, Civil Service (Medical): No    Lack of Transportation (Non-Medical): No  Physical Activity: Insufficiently Active (10/10/2022)   Exercise Vital Sign    Days of Exercise per Week: 3 days    Minutes of Exercise per Session: 30 min  Stress: No Stress Concern Present (10/10/2022)   [employer] of Occupational Health - Occupational Stress Questionnaire    Feeling of Stress : Not at all  Social Connections: Socially Isolated (10/10/2022)   Social Connection and Isolation Panel [NHANES]    Frequency of Communication with Friends and Family: More than three times a week    Frequency of Social Gatherings with Friends and Family: Three times a week    Attends Religious Services: Never    Active Member of Clubs or Organizations: No    Attends Banker Meetings: Never    Marital Status: Widowed  Intimate Partner Violence: Not At Risk (12/21/2022)   Humiliation, Afraid, Rape, and Kick questionnaire    Fear of Current or Ex-Partner: No    Emotionally Abused: No    Physically Abused: No    Sexually Abused: No    Past Surgical History:  Procedure Laterality Date   ATRIAL FLUTTER ABLATION  2002   atrial flutter; subsequently developed atrial  fibrillation   AV NODE ABLATION  01/24/2013   CARDIAC CATHETERIZATION  2002   CARDIAC CATHETERIZATION N/A 10/16/2014   Procedure: Left Heart Cath and Coronary Angiography;  Surgeon: Tonny Bollman, MD;  Location: [facility] INVASIVE CV LAB;  Service: Cardiovascular;  Laterality: N/A;   CARDIOVERSION  05/31/2011   Procedure: CARDIOVERSION;  Surgeon: [name]. [name], MD;  Location: [facility] ORS;  Service: Cardiovascular;  Laterality: N/A;   CARPAL TUNNEL RELEASE Left 1980's   COLONOSCOPY WITH PROPOFOL N/A 12/30/2018   Procedure: COLONOSCOPY WITH PROPOFOL;  Surgeon: Wyline Mood, MD;  Location: [facility] ENDOSCOPY;  Service: Gastroenterology;  Laterality: N/A;   COLONOSCOPY WITH PROPOFOL  N/A 12/04/2019   Procedure: COLONOSCOPY WITH PROPOFOL;  Surgeon: Wyline Mood, MD;  Location: [facility] ENDOSCOPY;  Service: Gastroenterology;  Laterality: N/A;   COLONOSCOPY WITH PROPOFOL N/A 06/13/2021   Procedure: COLONOSCOPY WITH PROPOFOL;  Surgeon: Wyline Mood, MD;  Location: [facility] ENDOSCOPY;  Service: Gastroenterology;  Laterality: N/A;   ESOPHAGOGASTRODUODENOSCOPY N/A 03/01/2022   Procedure: ESOPHAGOGASTRODUODENOSCOPY (EGD);  Surgeon: Vida Rigger, MD;  Location: [facility] ENDOSCOPY;  Service: Gastroenterology;  Laterality: N/A;   ESOPHAGOGASTRODUODENOSCOPY (EGD) WITH PROPOFOL N/A 12/21/2022   Procedure: ESOPHAGOGASTRODUODENOSCOPY (EGD) WITH PROPOFOL;  Surgeon: Benancio Deeds, MD;  Location: [facility] ENDOSCOPY;  Service: Gastroenterology;  Laterality: N/A;   ESOPHAGOGASTRODUODENOSCOPY (EGD) WITH PROPOFOL N/A 12/23/2022   Procedure: ESOPHAGOGASTRODUODENOSCOPY (EGD) WITH PROPOFOL;  Surgeon: Benancio Deeds, MD;  Location: [facility] ENDOSCOPY;  Service: Gastroenterology;  Laterality: N/A;   ESOPHAGOGASTRODUODENOSCOPY (EGD) WITH PROPOFOL N/A 03/14/2023   Procedure: ESOPHAGOGASTRODUODENOSCOPY (EGD) WITH PROPOFOL;  Surgeon: Wyline Mood, MD;  Location: [facility] ENDOSCOPY;  Service: Gastroenterology;  Laterality: N/A;   GIVENS CAPSULE STUDY Left 03/10/2022    Procedure: GIVENS CAPSULE STUDY;  Surgeon: Willis Modena, MD;  Location: [facility] ENDOSCOPY;  Service: Gastroenterology;  Laterality: Left;   HEMOSTASIS CLIP PLACEMENT  12/23/2022   Procedure: HEMOSTASIS CLIP PLACEMENT;  Surgeon: Benancio Deeds, MD;  Location: [facility] ENDOSCOPY;  Service: Gastroenterology;;   HEMOSTASIS CONTROL  12/21/2022   Procedure: HEMOSTASIS CONTROL;  Surgeon: Benancio Deeds, MD;  Location: [facility] ENDOSCOPY;  Service: Gastroenterology;;   INSERT / REPLACE / REMOVE PACEMAKER  01/24/2013    Medtronic Adapta L dual-chamber pacemaker, serial number [id number]    LEFT ATRIAL APPENDAGE OCCLUSION N/A 06/22/2022   Procedure: LEFT ATRIAL APPENDAGE OCCLUSION;  Surgeon: Tonny Bollman, MD;  Location: [facility] INVASIVE CV LAB;  Service: Cardiovascular;  Laterality: N/A;   LEFT HEART CATH AND CORONARY ANGIOGRAPHY N/A 06/07/2018   Procedure: LEFT HEART CATH AND CORONARY ANGIOGRAPHY;  Surgeon: Tonny Bollman, MD;  Location: [facility] INVASIVE CV LAB;  Service: Cardiovascular;  Laterality: N/A;   LEFT HEART CATHETERIZATION WITH CORONARY ANGIOGRAM N/A 03/17/2013   Procedure: LEFT HEART CATHETERIZATION WITH CORONARY ANGIOGRAM;  Surgeon: Kathleene Hazel, MD; LAD mild dz, D1 branch 100%, inferior branch 99%, CFX OK, RCA 50%, EF 65%     LOOP RECORDER IMPLANT  2002   PERMANENT PACEMAKER INSERTION N/A 01/24/2013   Procedure: PERMANENT PACEMAKER INSERTION;  Surgeon: Marinus Maw, MD;  Location: [facility] CATH LAB;  Service: Cardiovascular;  Laterality: N/A;   TEE WITHOUT CARDIOVERSION N/A 06/22/2022   Procedure: TRANSESOPHAGEAL ECHOCARDIOGRAM;  Surgeon: Tonny Bollman, MD;  Location: [facility] INVASIVE CV LAB;  Service: Cardiovascular;  Laterality: N/A;   TIBIAL TUBERCLERPLASTY  ~ 2003    Family History  Problem Relation Age of Onset   Alzheimer's disease Mother    Osteoporosis Mother     Allergies  Allergen Reactions   Januvia [Sitagliptin] Other (See Comments)    Elevated lipase   Metformin And Related  Diarrhea    Current Outpatient Medications on File Prior to Visit  Medication Sig Dispense Refill   Ascorbic Acid (VITAMIN C) 1000 MG tablet Take 2,000 mg by mouth daily.     aspirin EC 81 MG tablet Take 1 tablet (81 mg total) by mouth daily. Swallow whole.     blood glucose meter kit and supplies KIT Dispense based on patient and insurance preference. Use up to four times daily as directed. (FOR ICD-9 250.00, 250.01). 1 each 0   budesonide-formoterol (SYMBICORT) 160-4.5 MCG/ACT inhaler INHALE 2 PUFFS INTO THE LUNGS TWICE A  DAY (Patient taking differently: Inhale 2 puffs into the lungs daily as needed (for shortness of breath and wheezing).) 10.2 each 5   clopidogrel (PLAVIX) 75 MG tablet Take 1 tablet (75 mg total) by mouth daily. 90 tablet 2   empagliflozin (JARDIANCE) 25 MG TABS tablet TAKE 1 TABLET (25 MG TOTAL) BY MOUTH DAILY BEFORE BREAKFAST. FOR DIABETES. 90 tablet 1   fluticasone (FLONASE) 50 MCG/ACT nasal spray PLACE 1 SPRAY INTO BOTH NOSTRILS DAILY AS NEEDED FOR ALLERGIES OR RHINITIS. 48 mL 0   gabapentin (NEURONTIN) 300 MG capsule TAKE 1 CAPSULE BY MOUTH EVERY MORNING AND 2 CAPSULES BY MOUTH AT NIGHT FOR NEUROPATHY 270 capsule 2   glipiZIDE (GLUCOTROL XL) 5 MG 24 hr tablet Take 1 tablet (5 mg total) by mouth daily with breakfast. for diabetes. 90 tablet 1   glucose blood (ACCU-CHEK GUIDE) test strip TEST FOUR TIMES A DAY 400 strip 0   isosorbide mononitrate (IMDUR) 60 MG 24 hr tablet Take 1 tablet (60 mg total) by mouth daily. 90 tablet 3   lisinopril (ZESTRIL) 2.5 MG tablet TAKE 1 TABLET BY MOUTH EVERY DAY 90 tablet 0   nitroGLYCERIN (NITROSTAT) 0.4 MG SL tablet Place 1 tablet (0.4 mg total) under the tongue every 5 (five) minutes as needed for chest pain. Do not exceed 3 tablets in 24 hours. 25 tablet 0   pantoprazole (PROTONIX) 40 MG tablet Take 1 tablet (40 mg total) by mouth daily. For ulcer 90 tablet 1   rosuvastatin (CRESTOR) 20 MG tablet TAKE 1 TABLET BY MOUTH EVERY DAY IN THE  EVENING FOR CHOLESTEROL 90 tablet 2   sertraline (ZOLOFT) 100 MG tablet TAKE 1 TABLET (100 MG TOTAL) BY MOUTH DAILY. FOR ANXIETY AND DEPRESSION. 90 tablet 1   tamsulosin (FLOMAX) 0.4 MG CAPS capsule Take 1 capsule (0.4 mg total) by mouth daily. 90 capsule 3   TYLENOL 500 MG tablet Take 1,000 mg by mouth daily.     VENTOLIN HFA 108 (90 Base) MCG/ACT inhaler INHALE 2 PUFFS BY MOUTH EVERY 4 HOURS AS NEEDED FOR WHEEZE OR FOR SHORTNESS OF BREATH 18 each 0   No current facility-administered medications on file prior to visit.    BP 110/72   Pulse 99   Temp (!) 97.2 F (36.2 C) (Temporal)   Ht 5\' 11"  (1.803 m)   Wt 206 lb (93.4 kg)   SpO2 (!) 80%   BMI 28.73 kg/m  Objective:   Physical Exam Exam conducted with a chaperone present.  Cardiovascular:     Rate and Rhythm: Normal rate.  Pulmonary:     Effort: Pulmonary effort is normal.  Genitourinary:    Rectum: Guaiac result negative. No mass or tenderness.  Musculoskeletal:     Cervical back: Neck supple.  Skin:    General: Skin is warm and dry.  Neurological:     Mental Status: He is alert and oriented to person, place, and time.  Psychiatric:        Mood and Affect: Mood normal.           Assessment & Plan:  Dark stools Assessment & Plan: Differentials include GI bleed, side effects from oral iron. ED labs reviewed.  Rectal exam today with negative stool card. Reviewed CBC from ED visit which does not show anemia.   Symptoms are likely from supplemental iron tablet. He agrees.  Discussed signs/symptoms of GI bleed. Continue pantoprazole 40 mg daily  Orders: -     POCT occult blood stool  OSA (  obstructive sleep apnea) Assessment & Plan: Non compliant to CPAP.  Referral placed to ENT for inspire treatment.  Orders: -     Ambulatory referral to ENT        [name], NP

## 2023-06-01 NOTE — Assessment & Plan Note (Addendum)
Differentials include GI bleed, side effects from oral iron. ED labs reviewed.  Rectal exam today with negative stool card. Reviewed CBC from ED visit which does not show anemia.   Symptoms are likely from supplemental iron tablet. He agrees.  Discussed signs/symptoms of GI bleed. Continue pantoprazole 40 mg daily

## 2023-06-01 NOTE — Assessment & Plan Note (Signed)
Non compliant to CPAP.  Referral placed to ENT for inspire treatment.

## 2023-06-07 NOTE — Addendum Note (Signed)
Addended by: Geralyn Flash D on: 06/07/2023 12:56 PM   Modules accepted: Orders

## 2023-06-07 NOTE — Progress Notes (Signed)
 Remote pacemaker transmission.

## 2023-06-19 ENCOUNTER — Telehealth: Payer: Self-pay

## 2023-06-19 NOTE — Telephone Encounter (Signed)
 Called to check in with patient, who had LAAO on 06/22/2022. The patient reports doing well with no issues.  The patient understands to call with questions or concerns.  He was grateful for call.

## 2023-06-22 ENCOUNTER — Ambulatory Visit: Payer: Medicare Other

## 2023-07-16 ENCOUNTER — Other Ambulatory Visit: Payer: Self-pay | Admitting: Primary Care

## 2023-07-16 ENCOUNTER — Ambulatory Visit (INDEPENDENT_AMBULATORY_CARE_PROVIDER_SITE_OTHER): Admitting: Family

## 2023-07-16 ENCOUNTER — Encounter: Payer: Self-pay | Admitting: Family

## 2023-07-16 ENCOUNTER — Ambulatory Visit: Payer: Self-pay

## 2023-07-16 VITALS — BP 106/78 | HR 83 | Temp 97.6°F | Wt 206.6 lb

## 2023-07-16 DIAGNOSIS — Z7984 Long term (current) use of oral hypoglycemic drugs: Secondary | ICD-10-CM

## 2023-07-16 DIAGNOSIS — E1165 Type 2 diabetes mellitus with hyperglycemia: Secondary | ICD-10-CM | POA: Diagnosis not present

## 2023-07-16 DIAGNOSIS — J309 Allergic rhinitis, unspecified: Secondary | ICD-10-CM

## 2023-07-16 DIAGNOSIS — J0110 Acute frontal sinusitis, unspecified: Secondary | ICD-10-CM | POA: Insufficient documentation

## 2023-07-16 DIAGNOSIS — H109 Unspecified conjunctivitis: Secondary | ICD-10-CM | POA: Insufficient documentation

## 2023-07-16 MED ORDER — AMOXICILLIN-POT CLAVULANATE 875-125 MG PO TABS: 1.0000 | ORAL_TABLET | Freq: Two times a day (BID) | ORAL | 0 refills | Status: DC

## 2023-07-16 MED ORDER — CIPROFLOXACIN HCL 0.3 % OP SOLN
OPHTHALMIC | 0 refills | Status: DC
Start: 2023-07-16 — End: 2023-10-30

## 2023-07-16 NOTE — Assessment & Plan Note (Addendum)
 Prescription given for augmentin 875/125 mg po bid for ten days. Pt to continue tylenol prn sinus pain. Continue with humidifier prn and steam showers recommended as well. instructed If no symptom improvement in 48 hours please f/u

## 2023-07-16 NOTE — Telephone Encounter (Signed)
Noted and appreciate Tabitha's evaluation  ?

## 2023-07-16 NOTE — Telephone Encounter (Signed)
 Copied from CRM [id number]. Topic: Clinical - Red Word Triage >> Jul 16, 2023  7:45 AM Pascal Lux wrote: Red Word that prompted transfer to Nurse Triage: Patient thinks he may have pink eye.   Chief Complaint: Eye Symptom and Discharge  Symptoms: Discharge, Redness, and Blurred Vision Frequency: One Week Pertinent Negatives: Patient denies swelling Disposition: [] ED /[] Urgent Care (no appt availability in office) / [x] Appointment(In office/virtual)/ []  [facility]/ [] Home Care/ [] Refused Recommended Disposition /[] [facility]/ []  Follow-up with PCP  Additional Notes: RB is being triaged for  eye symptoms including pus like discharge and redness.   The patient states the symptoms started one week ago and have worsened. The patient denies swelling but reports an increase in pus like drainage and that his vision was a little blurry.    The patient denies having any other symptoms and denies a runny nose, congestion, or cough. In office appointment made with another providee in office for this morning.   Reason for Disposition  Blurred vision  Answer Assessment - Initial Assessment Questions 1. EYE DISCHARGE: "Is the discharge in one or both eyes?" "What color is it?" "How much is there?" "When did the discharge start?"      Yes, Pus like discharge. One week ago, onset  2. REDNESS OF SCLERA: "Is the redness in one or both eyes?" "When did the redness start?"      Yes  3. EYELIDS: "Are the eyelids red or swollen?" If Yes, ask: "How much?"      Redness  4. VISION: "Is there any difficulty seeing clearly?"      Blurry Vision  5. PAIN: "Is there any pain? If Yes, ask: "How bad is it?" (Scale 1-10; or mild, moderate, severe)    - MILD (1-3): doesn't interfere with normal activities     - MODERATE (4-7): interferes with normal activities or awakens from sleep    - SEVERE (8-10): excruciating pain, unable to do any normal activities       6  6. CONTACT LENS: "Do you wear  contacts?"     No  7. OTHER SYMPTOMS: "Do you have any other symptoms?" (e.g., fever, runny nose, cough)     No  Protocols used: Eye - Pus or Discharge-A-AH

## 2023-07-16 NOTE — Telephone Encounter (Signed)
 No problem. His eye was pretty goopy hopefully we've got it taken care of.

## 2023-07-16 NOTE — Progress Notes (Signed)
 Established Patient Office Visit  Subjective:   Patient ID: [name] Cook, male    DOB: [name] 30, 1951  Age: 73 y.o. MRN: [id number]  CC:  Chief Complaint  Patient presents with   Acute Visit    Possible pink eye in right eye. Onset was 1 week ago.    HPI: [name] Cook is a 73 y.o. male presenting on 07/16/2023 for Acute Visit (Possible pink eye in right eye. Onset was 1 week ago.)  About one week ago started with blurry vision and red eye with crusting in the am. Does not have  gritty sensation has not been doing anything where he thinks something may have gotten in his eye. Pain yesterday but no pain today. Does notice yellow drainage. States does not have blurry vision today.   Denies any allergy like symptoms.  No nasal congestion, sinus pressure, cough or sore throat.   Also does note he is feeling increased weakness and fatigue.  Just got over a sinus infection.       ROS: Negative unless specifically indicated above in HPI.   Relevant past medical history reviewed and updated as indicated.   Allergies and medications reviewed and updated.   Current Outpatient Medications:    amoxicillin-clavulanate (AUGMENTIN) 875-125 MG tablet, Take 1 tablet by mouth 2 (two) times daily., Disp: 20 tablet, Rfl: 0   Ascorbic Acid (VITAMIN C) 1000 MG tablet, Take 2,000 mg by mouth daily., Disp: , Rfl:    aspirin EC 81 MG tablet, Take 1 tablet (81 mg total) by mouth daily. Swallow whole., Disp: , Rfl:    blood glucose meter kit and supplies KIT, Dispense based on patient and insurance preference. Use up to four times daily as directed. (FOR ICD-9 250.00, 250.01)., Disp: 1 each, Rfl: 0   budesonide-formoterol (SYMBICORT) 160-4.5 MCG/ACT inhaler, INHALE 2 PUFFS INTO THE LUNGS TWICE A DAY (Patient taking differently: Inhale 2 puffs into the lungs daily as needed (for shortness of breath and wheezing).), Disp: 10.2 each, Rfl: 5   ciprofloxacin (CILOXAN) 0.3 % ophthalmic solution, Administer 1 drop,  every 2 hours, while awake, for 2 days. Then 1 drop, every 4 hours, while awake, for the next 5 days., Disp: 5 mL, Rfl: 0   clopidogrel (PLAVIX) 75 MG tablet, Take 1 tablet (75 mg total) by mouth daily., Disp: 90 tablet, Rfl: 2   empagliflozin (JARDIANCE) 25 MG TABS tablet, TAKE 1 TABLET (25 MG TOTAL) BY MOUTH DAILY BEFORE BREAKFAST. FOR DIABETES., Disp: 90 tablet, Rfl: 1   fluticasone (FLONASE) 50 MCG/ACT nasal spray, PLACE 1 SPRAY INTO BOTH NOSTRILS DAILY AS NEEDED FOR ALLERGIES OR RHINITIS., Disp: 48 mL, Rfl: 0   gabapentin (NEURONTIN) 300 MG capsule, TAKE 1 CAPSULE BY MOUTH EVERY MORNING AND 2 CAPSULES BY MOUTH AT NIGHT FOR NEUROPATHY, Disp: 270 capsule, Rfl: 2   glipiZIDE (GLUCOTROL XL) 5 MG 24 hr tablet, Take 1 tablet (5 mg total) by mouth daily with breakfast. for diabetes., Disp: 90 tablet, Rfl: 1   glucose blood (ACCU-CHEK GUIDE) test strip, TEST FOUR TIMES A DAY, Disp: 400 strip, Rfl: 0   isosorbide mononitrate (IMDUR) 60 MG 24 hr tablet, Take 1 tablet (60 mg total) by mouth daily., Disp: 90 tablet, Rfl: 3   lisinopril (ZESTRIL) 2.5 MG tablet, TAKE 1 TABLET BY MOUTH EVERY DAY, Disp: 90 tablet, Rfl: 0   nitroGLYCERIN (NITROSTAT) 0.4 MG SL tablet, Place 1 tablet (0.4 mg total) under the tongue every 5 (five) minutes as needed for chest pain.  Do not exceed 3 tablets in 24 hours., Disp: 25 tablet, Rfl: 0   pantoprazole (PROTONIX) 40 MG tablet, Take 1 tablet (40 mg total) by mouth daily. For ulcer, Disp: 90 tablet, Rfl: 1   rosuvastatin (CRESTOR) 20 MG tablet, TAKE 1 TABLET BY MOUTH EVERY DAY IN THE EVENING FOR CHOLESTEROL, Disp: 90 tablet, Rfl: 2   sertraline (ZOLOFT) 100 MG tablet, TAKE 1 TABLET (100 MG TOTAL) BY MOUTH DAILY. FOR ANXIETY AND DEPRESSION., Disp: 90 tablet, Rfl: 1   tamsulosin (FLOMAX) 0.4 MG CAPS capsule, Take 1 capsule (0.4 mg total) by mouth daily., Disp: 90 capsule, Rfl: 3   TYLENOL 500 MG tablet, Take 1,000 mg by mouth daily., Disp: , Rfl:    VENTOLIN HFA 108 (90 Base)  MCG/ACT inhaler, INHALE 2 PUFFS BY MOUTH EVERY 4 HOURS AS NEEDED FOR WHEEZE OR FOR SHORTNESS OF BREATH, Disp: 18 each, Rfl: 0  Allergies  Allergen Reactions   Januvia [Sitagliptin] Other (See Comments)    Elevated lipase   Metformin And Related Diarrhea    Objective:   BP 106/78 (BP Location: Right Arm, Patient Position: Sitting, Cuff Size: Normal)   Pulse 83   Temp 97.6 F (36.4 C) (Temporal)   Wt 206 lb 9.6 oz (93.7 kg)   SpO2 98%   BMI 28.81 kg/m    Physical Exam Vitals reviewed.  Constitutional:      General: He is not in acute distress.    Appearance: Normal appearance. He is obese. He is not ill-appearing, toxic-appearing or diaphoretic.  HENT:     Head: Normocephalic.     Right Ear: Tympanic membrane normal.     Left Ear: Tympanic membrane normal.     Nose: Nose normal.     Mouth/Throat:     Mouth: Mucous membranes are moist.  Eyes:     General: Vision grossly intact. No allergic shiner or visual field deficit.       Right eye: Discharge (yellow) present.     Extraocular Movements: Extraocular movements intact.     Right eye: Normal extraocular motion and no nystagmus.     Conjunctiva/sclera:     Right eye: Right conjunctiva is injected. Exudate (yellow) present.     Pupils: Pupils are equal, round, and reactive to light.     Comments: Right eye with brown dry crusting on eye lids as well as base of eye. Redness right sclera.   Cardiovascular:     Rate and Rhythm: Normal rate and regular rhythm.  Pulmonary:     Effort: Pulmonary effort is normal.     Breath sounds: Normal breath sounds. No wheezing.  Musculoskeletal:        General: Normal range of motion.     Cervical back: Normal range of motion.  Neurological:     General: No focal deficit present.     Mental Status: He is alert and oriented to person, place, and time. Mental status is at baseline.  Psychiatric:        Mood and Affect: Mood normal.        Behavior: Behavior normal.        Thought  Content: Thought content normal.        Judgment: Judgment normal.     Assessment & Plan:  Acute non-recurrent frontal sinusitis Assessment & Plan: Prescription given for augmentin 875/125 mg po bid for ten days. Pt to continue tylenol prn sinus pain. Continue with humidifier prn and steam showers recommended as well. instructed If no symptom  improvement in 48 hours please f/u   Orders: -     Amoxicillin-Pot Clavulanate; Take 1 tablet by mouth 2 (two) times daily.  Dispense: 20 tablet; Refill: 0  Type 2 diabetes mellitus with hyperglycemia, without long-term current use of insulin (HCC) -     Ambulatory referral to Ophthalmology  Bacterial conjunctivitis Assessment & Plan: Rx ciprofloxacin drops  Strict cautions in regards to blurry vision, if loss of vision or worsening vision needs to go to er and or see ophthalmology urgently Sending referral to ophthalmology as well for routine diabetic retinopathy screening as pt needs new provider  Discussed how to wash eye appropriately with warm warm cloth from inner to outer canthus. Wash bed sheets and pillow cases to prevent re-infection. Frequent handwashing. If no improvement in 24-48 hours with eye drops as prescribed, please call office.    Orders: -     Ciprofloxacin HCl; Administer 1 drop, every 2 hours, while awake, for 2 days. Then 1 drop, every 4 hours, while awake, for the next 5 days.  Dispense: 5 mL; Refill: 0     Follow up plan: Return for f/u PCP if no improvement in symptoms.  Mort Sawyers, FNP

## 2023-07-16 NOTE — Assessment & Plan Note (Signed)
 Rx ciprofloxacin drops  Strict cautions in regards to blurry vision, if loss of vision or worsening vision needs to go to er and or see ophthalmology urgently Sending referral to ophthalmology as well for routine diabetic retinopathy screening as pt needs new provider  Discussed how to wash eye appropriately with warm warm cloth from inner to outer canthus. Wash bed sheets and pillow cases to prevent re-infection. Frequent handwashing. If no improvement in 24-48 hours with eye drops as prescribed, please call office.

## 2023-07-23 ENCOUNTER — Other Ambulatory Visit: Payer: Self-pay | Admitting: *Deleted

## 2023-07-23 DIAGNOSIS — C61 Malignant neoplasm of prostate: Secondary | ICD-10-CM

## 2023-07-30 ENCOUNTER — Ambulatory Visit (INDEPENDENT_AMBULATORY_CARE_PROVIDER_SITE_OTHER): Payer: Medicare Other

## 2023-07-30 DIAGNOSIS — I442 Atrioventricular block, complete: Secondary | ICD-10-CM | POA: Diagnosis not present

## 2023-07-30 DIAGNOSIS — I4891 Unspecified atrial fibrillation: Secondary | ICD-10-CM

## 2023-07-31 ENCOUNTER — Encounter: Payer: Self-pay | Admitting: Internal Medicine

## 2023-07-31 LAB — CUP PACEART REMOTE DEVICE CHECK
Battery Impedance: 1861 Ohm
Battery Remaining Longevity: 41 mo
Battery Voltage: 2.76 V
Brady Statistic RV Percent Paced: 100 %
Date Time Interrogation Session: 20250414073045
Implantable Lead Connection Status: 753985
Implantable Lead Connection Status: 753985
Implantable Lead Implant Date: 20141010
Implantable Lead Implant Date: 20141010
Implantable Lead Location: 753859
Implantable Lead Location: 753860
Implantable Lead Model: 5076
Implantable Lead Model: 5076
Implantable Pulse Generator Implant Date: 20141010
Lead Channel Impedance Value: 615 Ohm
Lead Channel Impedance Value: 67 Ohm
Lead Channel Pacing Threshold Amplitude: 0.875 V
Lead Channel Pacing Threshold Pulse Width: 0.4 ms
Lead Channel Setting Pacing Amplitude: 2.5 V
Lead Channel Setting Pacing Pulse Width: 0.4 ms
Lead Channel Setting Sensing Sensitivity: 4 mV
Zone Setting Status: 755011
Zone Setting Status: 755011

## 2023-08-01 ENCOUNTER — Other Ambulatory Visit: Payer: Self-pay

## 2023-08-01 DIAGNOSIS — C61 Malignant neoplasm of prostate: Secondary | ICD-10-CM

## 2023-08-02 LAB — PSA: Prostate Specific Ag, Serum: <0.1 ng/mL (ref 0.0–4.0)

## 2023-08-03 ENCOUNTER — Institutional Professional Consult (permissible substitution) (INDEPENDENT_AMBULATORY_CARE_PROVIDER_SITE_OTHER): Payer: 59

## 2023-08-06 ENCOUNTER — Ambulatory Visit (INDEPENDENT_AMBULATORY_CARE_PROVIDER_SITE_OTHER): Admitting: Psychiatry

## 2023-08-06 ENCOUNTER — Encounter: Payer: Self-pay | Admitting: Psychiatry

## 2023-08-06 VITALS — BP 118/68 | HR 87 | Temp 98.6°F | Ht 71.0 in | Wt 209.2 lb

## 2023-08-06 DIAGNOSIS — R4189 Other symptoms and signs involving cognitive functions and awareness: Secondary | ICD-10-CM | POA: Diagnosis not present

## 2023-08-06 DIAGNOSIS — F330 Major depressive disorder, recurrent, mild: Secondary | ICD-10-CM

## 2023-08-06 NOTE — Patient Instructions (Signed)
 Increase sertraline  150 mg at night  Obtain lab (TSH) Next appointment- 6/3 at 3 pm

## 2023-08-06 NOTE — Progress Notes (Signed)
 Psychiatric Initial Adult Assessment   Patient Identification: [name] Cook MRN:  [id number] Date of Evaluation:  08/06/2023 Referral Source: Gabriel John, NP  Chief Complaint:   Chief Complaint  Patient presents with   Establish Care   Visit Diagnosis:    ICD-10-CM   1. MDD (major depressive disorder), recurrent episode, moderate (HCC)  F33.1     2. Cognitive impairment  R41.89       History of Present Illness:   [name] Cook is a 73 y.o. year old male with a history of depression, anxiety, CAD s/p PCI, A fib/flutter, complete heart block s/p pacemaker type II diabetes, sleep apnea, who is referred for depression, anxiety.   He provides very brief responses, although he remains cooperative throughout the visit.  He states that he was recommended by his primary care to be seen. He feels depressed.  Although he is unable to describe in detail, he feels like he has headaches without pain, pressure in his head.  He thinks it has worsened over the past several years, although he cannot recall any triggers.  He reports some stress related to his girlfriend, who also has depression (He then inquired whether she could take any medication. He was advised to have her consult her provider for accurate medical guidance.) He states that she has some health issues, although he cannot remember what it is. He states that he lost his son, who passed away after hitting himself in a pool. It occurred a few years ago. However, he denies much stress, stating that he was not close wit his son. He has an estranged relationship with his younger sister in [place] . According to him, he was informed that she no longer wishes to have contact with him, though he is unsure of the reason. He denies any specific concern about his cardiac condition.    Depression- The patient has mood symptoms as in PHQ-9/GAD-7. He spends time, browsing facebook, watching sales, or watching TV. He goes out for grocery shopping. He  denies SI.   Memory- he reports difficulty in memory for many years.   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 He moved to [place] with his parents in 4 with his family. He states that "they (his parents) were good." He has been strange relationship with his sister.  He does not contact with his brother.  His mother passed away in 08/24/87, and father in 08-24-2011.  Associated Signs/Symptoms: Depression Symptoms:  depressed mood, anhedonia, fatigue, anxiety, (Hypo) Manic Symptoms:   denies decreased need for sleep, euphoria Anxiety Symptoms:   mild anxiety  Psychotic Symptoms:   denies AH, VH, paranoia PTSD Symptoms: Negative  Past Psychiatric History:  Outpatient:  Psychiatry admission: denies Previous suicide attempt: denies Past trials of medication: citalopram , lexapro , duloxetine , bupropion ,  History of violence: denies History of head injury: denies Legal: none  Previous Psychotropic Medications: Yes   Substance Abuse History in the last 12 months:  No.  Consequences of Substance Abuse: NA  Past Medical History:  Past Medical  History:  Diagnosis Date   Acute bronchitis with COPD (HCC) 03/07/2021   Acute pulmonary embolism (HCC) 03/08/2022   Arthritis    "knees and lower back" (03/14/2013)   Atrial flutter (HCC)    radiofrequency ablation in 2001   CAD (coronary artery disease)    a. Nonobstructive. Cardiac cath in 2001-50% mid RI, normal LM, LAD, RCA b. cath 10/16/2014 95% mid RCA treated with DES, 99% ost D1 medical management due to small  aneurysmal segment   Chronic anticoagulation    chronic Coumadin  anticoagulation   Chronic obstructive pulmonary disease (HCC) 04/20/2011   Diabetes mellitus, type 2 (HCC)    Diaphoresis 10/26/2021   Elevated lipase 06/23/2021   GERD (gastroesophageal reflux disease)    GIB (gastrointestinal bleeding) 03/08/2022   Hyperlipidemia    Hypertension    with hypertensive heart disease   Left knee pain 10/25/2017   medial   Obesity    Persistent atrial fibrillation (HCC)    recurrent atrial flutter since 2001 s/p DCCVs, multiple failed AADs, h/o tachy-mediated cardiomyopathy   Presence of Watchman left atrial appendage closure device 06/22/2022   Watchman  31mm FLX placed with Dr. Arlester Ladd   Shortness of breath    "can come on at any time" (03/14/2013)   Sleep apnea    "dx'd; couldn't wear the mask" (03/14/2013)   Symptomatic anemia 03/08/2022   Tobacco abuse    Urinary tract infection without hematuria 05/26/2022    Past Surgical History:  Procedure Laterality Date   ATRIAL FLUTTER ABLATION  2002   atrial flutter; subsequently developed atrial fibrillation   AV NODE ABLATION  01/24/2013   CARDIAC CATHETERIZATION  2002   CARDIAC CATHETERIZATION N/A 10/16/2014   Procedure: Left Heart Cath and Coronary Angiography;  Surgeon: Arnoldo Lapping, MD;  Location: [facility] INVASIVE CV LAB;  Service: Cardiovascular;  Laterality: N/A;   CARDIOVERSION  05/31/2011   Procedure: CARDIOVERSION;  Surgeon: Windsor Hatcher. Macdonald Savoy, MD;  Location: [facility];  Service: Cardiovascular;  Laterality: N/A;   CARPAL TUNNEL RELEASE Left 1980's   COLONOSCOPY WITH PROPOFOL  N/A 12/30/2018   Procedure: COLONOSCOPY WITH PROPOFOL ;  Surgeon: Luke Salaam, MD;  Location: [facility] ENDOSCOPY;  Service: Gastroenterology;  Laterality: N/A;   COLONOSCOPY WITH PROPOFOL  N/A 12/04/2019   Procedure: COLONOSCOPY WITH PROPOFOL ;  Surgeon: Luke Salaam, MD;  Location: [facility] ENDOSCOPY;  Service: Gastroenterology;  Laterality: N/A;   COLONOSCOPY WITH PROPOFOL   N/A 06/13/2021   Procedure: COLONOSCOPY WITH PROPOFOL ;  Surgeon: Luke Salaam, MD;  Location[facility] ENDOSCOPY;  Service: Gastroenterology;  Laterality: N/A;   ESOPHAGOGASTRODUODENOSCOPY N/A 03/01/2022   Procedure: ESOPHAGOGASTRODUODENOSCOPY (EGD);  Surgeon: Ozell Blunt, MD;  Location: [facility] ENDOSCOPY;  Service: Gastroenterology;  Laterality: N/A;   ESOPHAGOGASTRODUODENOSCOPY (EGD) WITH PROPOFOL  N/A 12/21/2022   Procedure: ESOPHAGOGASTRODUODENOSCOPY (EGD) WITH PROPOFOL ;  Surgeon: Ace Holder, MD;  Location: [facility] ENDOSCOPY;  Service: Gastroenterology;  Laterality: N/A;   ESOPHAGOGASTRODUODENOSCOPY (EGD) WITH PROPOFOL  N/A 12/23/2022   Procedure: ESOPHAGOGASTRODUODENOSCOPY (EGD) WITH PROPOFOL ;  Surgeon: Ace Holder, MD;  Location: [facility] ENDOSCOPY;  Service: Gastroenterology;  Laterality: N/A;   ESOPHAGOGASTRODUODENOSCOPY (EGD) WITH PROPOFOL  N/A 03/14/2023   Procedure: ESOPHAGOGASTRODUODENOSCOPY (EGD) WITH PROPOFOL ;  Surgeon: Luke Salaam, MD;  Location: [facility] ENDOSCOPY;  Service: Gastroenterology;  Laterality: N/A;   GIVENS CAPSULE STUDY Left 03/10/2022   Procedure: GIVENS CAPSULE STUDY;  Surgeon: Evangeline Hilts, MD;  Location: [facility] ENDOSCOPY;  Service: Gastroenterology;  Laterality: Left;   HEMOSTASIS CLIP PLACEMENT  12/23/2022   Procedure: HEMOSTASIS CLIP PLACEMENT;  Surgeon: Ace Holder, MD;  Location:  [facility] ENDOSCOPY;  Service: Gastroenterology;;   HEMOSTASIS CONTROL  12/21/2022   Procedure: HEMOSTASIS CONTROL;  Surgeon: Ace Holder, MD;  Location: [facility] ENDOSCOPY;  Service: Gastroenterology;;   INSERT / REPLACE / REMOVE PACEMAKER  01/24/2013    Medtronic Adapta L dual-chamber pacemaker, serial number NWE [id number]    LEFT ATRIAL APPENDAGE OCCLUSION N/A 06/22/2022   Procedure: LEFT ATRIAL APPENDAGE OCCLUSION;  Surgeon: Arnoldo Lapping, MD;  Location: [facility] INVASIVE CV LAB;  Service: Cardiovascular;  Laterality: N/A;   LEFT HEART CATH AND CORONARY ANGIOGRAPHY N/A 06/07/2018   Procedure: LEFT  HEART CATH AND CORONARY ANGIOGRAPHY;  Surgeon: Arnoldo Lapping, MD;  Location: [facility] INVASIVE CV LAB;  Service: Cardiovascular;  Laterality: N/A;   LEFT HEART CATHETERIZATION WITH CORONARY ANGIOGRAM N/A 03/17/2013   Procedure: LEFT HEART CATHETERIZATION WITH CORONARY ANGIOGRAM;  Surgeon: Odie Benne, MD; LAD mild dz, D1 branch 100%, inferior branch 99%, CFX OK, RCA 50%, EF 65%     LOOP RECORDER IMPLANT  2002   PERMANENT PACEMAKER INSERTION N/A 01/24/2013   Procedure: PERMANENT PACEMAKER INSERTION;  Surgeon: Tammie Fall, MD;  Location[facility] CATH LAB;  Service: Cardiovascular;  Laterality: N/A;   TEE WITHOUT CARDIOVERSION N/A 06/22/2022   Procedure: TRANSESOPHAGEAL ECHOCARDIOGRAM;  Surgeon: Arnoldo Lapping, MD;  Location: [facility] INVASIVE CV LAB;  Service: Cardiovascular;  Laterality: N/A;   TIBIAL TUBERCLERPLASTY  ~ 2003    Family Psychiatric History: as below  Family History:  Family History  Problem Relation Age of Onset   Dementia Mother    Alzheimer's disease Mother    Osteoporosis Mother     Social History:   Social History   Socioeconomic History   Marital status: Widowed    Spouse name: Not on file   Number of children: 1   Years of education: Not on file   Highest education level: Not on file  Occupational History   Occupation: Unemployed    Employer: UNEMPLOYED  Tobacco Use   Smoking status: Former    Current packs/day: 0.00    Average packs/day: 1 pack/day for 42.0 years (42.0 ttl pk-yrs)    Types: Cigarettes    Start date: 12/31/1971    Quit date: 12/30/2013    Years since quitting: 9.6   Smokeless tobacco: Never   Tobacco comments:    Former smoker 03/20/22  Vaping Use   Vaping status: Never Used  Substance and Sexual Activity   Alcohol use: Not Currently    Comment: 03/14/2013 "stopped drinking back in 2002; never had problem w/it"   Drug use: No   Sexual activity: Not Currently  Other Topics Concern   Not on file  Social History Narrative   Single.    Retired.    1 son, deceased.    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   Enjoys playing on the computer.       Social Drivers of Corporate investment banker Strain: Low Risk  (10/10/2022)   Overall Financial Resource Strain (CARDIA)    Difficulty of Paying Living Expenses: Not hard at all  Food Insecurity: No Food Insecurity (12/29/2022)   Hunger Vital Sign    Worried About Running Out of Food in the Last Year: Never true    Ran Out of Food in the Last Year: Never true  Transportation Needs: No Transportation Needs (12/29/2022)   PRAPARE - Administrator, Civil Service (Medical): No    Lack of Transportation (Non-Medical): No  Physical Activity: Insufficiently  Active (10/10/2022)   Exercise Vital Sign    Days of Exercise per Week: 3 days    Minutes of Exercise per Session: 30 min  Stress: No Stress Concern Present (10/10/2022)   [employer] of Occupational Health - Occupational Stress Questionnaire    Feeling of Stress : Not at all  Social Connections: Socially Isolated (10/10/2022)   Social Connection and Isolation Panel [NHANES]    Frequency of Communication with Friends and Family: More than three times a week    Frequency of Social Gatherings with Friends and Family: Three times a week    Attends Religious Services: Never    Active Member of Clubs or Organizations: No    Attends Banker Meetings: Never    Marital Status: Widowed    Additional Social History: as above  Allergies:   Allergies  Allergen Reactions   Januvia  [Sitagliptin ] Other (See Comments)    Elevated lipase   Metformin  And Related Diarrhea    Metabolic Disorder Labs: Lab Results  Component Value Date   HGBA1C 5.5 05/01/2023   MPG 137 03/08/2022   MPG 134 03/28/2017   No results found for: "PROLACTIN" Lab Results  Component Value Date   CHOL 141 10/27/2022   TRIG 115.0 10/27/2022   HDL 55.10 10/27/2022   CHOLHDL 3 10/27/2022    VLDL 23.0 10/27/2022   LDLCALC 63 10/27/2022   LDLCALC 49 10/26/2021   Lab Results  Component Value Date   TSH 1.17 04/28/2022    Therapeutic Level Labs: No results found for: "LITHIUM" No results found for: "CBMZ" No results found for: "VALPROATE"  Current Medications: Current Outpatient Medications  Medication Sig Dispense Refill   Ascorbic Acid (VITAMIN C) 1000 MG tablet Take 2,000 mg by mouth daily.     aspirin  EC 81 MG tablet Take 1 tablet (81 mg total) by mouth daily. Swallow whole.     budesonide -formoterol  (SYMBICORT ) 160-4.5 MCG/ACT inhaler INHALE 2 PUFFS INTO THE LUNGS TWICE A DAY (Patient taking differently: Inhale 2 puffs into the lungs daily as needed (for shortness of breath and wheezing).) 10.2 each 5   clopidogrel  (PLAVIX ) 75 MG tablet Take 1 tablet (75 mg total) by mouth daily. 90 tablet 2   empagliflozin  (JARDIANCE ) 25 MG TABS tablet TAKE 1 TABLET (25 MG TOTAL) BY MOUTH DAILY BEFORE BREAKFAST. FOR DIABETES. 90 tablet 1   fluticasone  (FLONASE ) 50 MCG/ACT nasal spray PLACE 1 SPRAY INTO BOTH NOSTRILS DAILY AS NEEDED FOR ALLERGIES OR RHINITIS. 48 g 0   gabapentin  (NEURONTIN ) 300 MG capsule TAKE 1 CAPSULE BY MOUTH EVERY MORNING AND 2 CAPSULES BY MOUTH AT NIGHT FOR NEUROPATHY 270 capsule 2   glipiZIDE  (GLUCOTROL  XL) 5 MG 24 hr tablet Take 1 tablet (5 mg total) by mouth daily with breakfast. for diabetes. 90 tablet 1   isosorbide  mononitrate (IMDUR ) 60 MG 24 hr tablet Take 1 tablet (60 mg total) by mouth daily. 90 tablet 3   lisinopril  (ZESTRIL ) 2.5 MG tablet TAKE 1 TABLET BY MOUTH EVERY DAY 90 tablet 0   nitroGLYCERIN  (NITROSTAT ) 0.4 MG SL tablet Place 1 tablet (0.4 mg total) under the tongue every 5 (five) minutes as needed for chest pain. Do not exceed 3 tablets in 24 hours. 25 tablet 0   pantoprazole  (PROTONIX ) 40 MG tablet Take 1 tablet (40 mg total) by mouth daily. For ulcer 90 tablet 1   rosuvastatin  (CRESTOR ) 20 MG tablet TAKE 1 TABLET BY MOUTH EVERY DAY IN THE  EVENING FOR CHOLESTEROL 90 tablet 2  sertraline  (ZOLOFT ) 100 MG tablet TAKE 1 TABLET (100 MG TOTAL) BY MOUTH DAILY. FOR ANXIETY AND DEPRESSION. 90 tablet 1   VENTOLIN  HFA 108 (90 Base) MCG/ACT inhaler INHALE 2 PUFFS BY MOUTH EVERY 4 HOURS AS NEEDED FOR WHEEZE OR FOR SHORTNESS OF BREATH 18 each 0   blood glucose meter kit and supplies KIT Dispense based on patient and insurance preference. Use up to four times daily as directed. (FOR ICD-9 250.00, 250.01). 1 each 0   ciprofloxacin  (CILOXAN ) 0.3 % ophthalmic solution Administer 1 drop, every 2 hours, while awake, for 2 days. Then 1 drop, every 4 hours, while awake, for the next 5 days. 5 mL 0   glucose blood (ACCU-CHEK GUIDE) test strip TEST FOUR TIMES A DAY 400 strip 0   tamsulosin  (FLOMAX ) 0.4 MG CAPS capsule Take 1 capsule (0.4 mg total) by mouth daily. 90 capsule 3   TYLENOL  500 MG tablet Take 1,000 mg by mouth daily.     No current facility-administered medications for this visit.    Musculoskeletal: Strength & Muscle Tone: within normal limits Gait & Station: normal Patient leans: N/A  Psychiatric Specialty Exam: Review of Systems  Psychiatric/Behavioral:  Positive for decreased concentration and dysphoric mood. Negative for agitation, behavioral problems, confusion, hallucinations, self-injury, sleep disturbance and suicidal ideas. The patient is nervous/anxious. The patient is not hyperactive.   All other systems reviewed and are negative.   Blood pressure 118/68, pulse 87, temperature 98.6 F (37 C), temperature source Temporal, height 5\' 11"  (1.803 m), weight 209 lb 3.2 oz (94.9 kg), SpO2 91%.Body mass index is 29.18 kg/m.  General Appearance: Well Groomed  Eye Contact:  Good  Speech:  Clear and Coherent  Volume:  Normal  Mood:  Depressed  Affect:  Appropriate, Congruent, and Restricted  Thought Process:  Coherent  Orientation:  Full (Time, Place, and Person)  Thought Content:  Logical  Suicidal Thoughts:  No  Homicidal  Thoughts:  No  Memory:  Immediate;   Good  Judgement:  Good  Insight:  Present  Psychomotor Activity:  Normal  Concentration:  Concentration: Good and Attention Span: Good  Recall:  Good  Fund of Knowledge:Good  Language: Good  Akathisia:  No  Handed:  Right  AIMS (if indicated):  not done  Assets:  Communication Skills Desire for Improvement  ADL's:  Intact  Cognition: WNL  Sleep:  Good   Screenings: GAD-7    Flowsheet Row Office Visit from 08/06/2023 in [facility] Office Visit from 06/01/2023 in [facility] [place] Office Visit from 10/27/2022 in [facility] Office Visit from 07/05/2022 in [facility] Office Visit from 05/26/2022 in [facility]  Total GAD-7 Score 12 0 2 3 4       Mini-Mental    Flowsheet Row Clinical Support from 09/23/2018 in [facility] Clinical Support from 09/21/2017 in [facility]  Total Score (max 30 points ) 14 18      PHQ2-9    Flowsheet Row Office Visit from 08/06/2023 in [facility] Office Visit from 06/01/2023 in [facility] Office Visit from 05/01/2023 in [facility] Office Visit from 10/27/2022 in [facility] Clinical Support from 10/10/2022 in [facility] [place]  PHQ-2 Total Score 3 2 0 0 0  PHQ-9 Total Score 7 5 3 3  --      Flowsheet Row Office Visit from 08/06/2023 in [facility] ED from 05/29/2023 in [facility] Department at [facility] Admission (Discharged) from 03/14/2023 in [facility]  C-SSRS RISK CATEGORY No Risk No Risk No Risk        Assessment and Plan:  [name] Cook is a 73 y.o. year old male with a history of depression, anxiety, CAD s/p PCI, A fib/flutter, complete heart block s/p pacemaker type II diabetes, sleep apnea, who is referred for depression, anxiety.   1. MDD (major depressive disorder), recurrent episode, mild (HCC) Acute stressors include: conflict with his girlfriend with depression, some physical issues (he cannot recollect) Other stressors include: estranged relationship with his sister, brother in [place], loss of his son in 4's, who he had estranged relationship (he denies any struggle related to this) History: no depression prior to 15's per patient, managed by his primary care   Exam is notable for restricted affect, and he reports depressive symptoms with anhedonia, which has worsened over the past year, although he cannot identify any triggers.  He reports strained relationship with his girlfriend, and estranged relationship with his siblings in [place].  He reports having had a good relationship with his parents growing up, though he does not provide further details.  Will uptitrate sertraline  to optimize treatment for depression while monitoring any bleeding tendency. Will also monitor any risk of qtc prolongation.   2. Cognitive impairment Functional Status   IADL: Independent in the following: driving           Requires assistance with the following: medications, managing finances  ADL  Independent in the following: bathing and hygiene, feeding, continence, grooming and toileting, walking          Requires assistance with the following: Folate, Vitamin B12 (03/2023), TSH Images Head CT 12/2022 (There is mild cerebral atrophy with widening of the extra-axial spaces and ventricular dilatation. There are areas of decreased attenuation within the white matter tracts of the supratentorial brain, consistent with microvascular disease changes.) Neuropsych assessment:  Etiology: r/o VaD   The exam is notable  for concrete thought process, occasional word-finding difficulty, memory loss as evidenced by his inability to recall the physical health issues affecting his girlfriend. He is cooperative during the exam otherwise.  He reportedly had "leaning block," although he is not aware of having learning disability. It is unclear whether this represents his baseline functioning or is related to mood symptoms and/or cognitive impairment, such as dementia Will obtain labs to rule out medical health issues contributing to his symptoms. Will plan to obtain [facility] at his next visit.   Plan Increase sertraline  150 mg at night  Obtain lab (TSH, fT4) Next appointment- 6/3 at 3 pm, IP   The patient demonstrates the following risk factors for suicide: Chronic risk factors for suicide include: psychiatric disorder of depression . Acute risk factors for suicide include: family or marital conflict and unemployment. Protective factors for this patient include: positive social support and hope for the future. Considering these factors, the overall suicide risk at this point appears to be low. Patient is appropriate for outpatient follow up.   Collaboration of Care: Other reviewed notes in Epic  Patient/Guardian was advised Release of Information must be obtained prior to any record release in order to collaborate their care with an outside  provider. Patient/Guardian was advised if they have not already done so to contact the registration department to sign all necessary forms in order for us  to release information regarding their care.   A total of 60 minutes was spent on the following activities during the encounter date, which includes but is not limited to: preparing to see the patient (e.g., reviewing tests and records), obtaining and/or reviewing separately obtained history, performing a medically necessary examination or evaluation, counseling and educating the patient, family, or caregiver, ordering medications, tests, or  procedures, referring and communicating with other healthcare professionals (when not reported separately), documenting clinical information in the electronic or paper health record, independently interpreting test or lab results and communicating these results to the family or caregiver, and coordinating care (when not reported separately).   Consent: Patient/Guardian gives verbal consent for treatment and assignment of benefits for services provided during this visit. Patient/Guardian expressed understanding and agreed to proceed.   Todd Fossa, MD 4/21/20252:26 PM

## 2023-08-07 ENCOUNTER — Encounter: Payer: Self-pay | Admitting: Psychiatry

## 2023-08-07 LAB — T4, FREE: Free T4: 0.86 ng/dL (ref 0.82–1.77)

## 2023-08-07 LAB — TSH: TSH: 1.92 u[IU]/mL (ref 0.450–4.500)

## 2023-08-07 NOTE — Progress Notes (Unsigned)
 08/08/2023 12:43 PM   [name] Cook 21-Feb-1951 [id number]  Referring provider: Gabriel John, NP [address] [place],  [place] [address]  Urological history: 1. ED -contributing factors of age, CAD, sleep apnea, COPD, history of smoking, HTN, HLD and diabetes -not a candidate for PDE5i's secondary due nitrate use -insurance would not cover IPP   2. Prostate cancer -PSA (07/2023) <0.1 -iPSA 6.10 -prostate biopsy (2020) - Prostate biopsy revealed 6/12 cores, all on the right including 3 cores of Gleason 3+3 (up to 99%) and 3 cores of 3+4 up to 96%.  High PSA 6.1.  Rectal exam was abnormal with a nodule at the right apex and firm on the side.  TRUS vol 50 with small median lobe, irregular on right. -cT2 prostate cancer, high-volume intermediate risk involving right-sided gland only -treatment with EBRT and short-term Lupron .  He received his dose in 06/2018.  He completed radiation in 08/2018.  3. BPH w/LU TS -cysto (07/2022) -enlarged prostate with vascular changes consistent with radiation and a tiny median lobe -Tamsulosin  0.4 mg daily  4. High risk hematuria -former smoker -CTU (06/2022) - no worrisome GU findings -cysto (07/2022) -enlarged prostate with vascular changes consistent with radiation  No chief complaint on file.   HPI: [name] Cook is a 73 y.o. man who presents today for 6 month follow up for prostate cancer.    Previous records reviewed.     I PSS ***    Score:  1-7 Mild 8-19 Moderate 20-35 Severe   PMH: Past Medical History:  Diagnosis Date   Acute bronchitis with COPD (HCC) 03/07/2021   Acute pulmonary embolism (HCC) 03/08/2022   Arthritis    "knees and lower back" (03/14/2013)   Atrial flutter (HCC)    radiofrequency ablation in 2001   CAD (coronary artery disease)    a. Nonobstructive. Cardiac cath in 2001-50% mid RI, normal LM, LAD, RCA b. cath 10/16/2014 95% mid RCA treated with DES, 99% ost D1 medical management due to small  aneurysmal segment   Chronic anticoagulation    chronic Coumadin  anticoagulation   Chronic obstructive pulmonary disease (HCC) 04/20/2011   Diabetes mellitus, type 2 (HCC)    Diaphoresis 10/26/2021   Elevated lipase 06/23/2021   GERD (gastroesophageal reflux disease)    GIB (gastrointestinal bleeding) 03/08/2022   Hyperlipidemia    Hypertension    with hypertensive heart disease   Left knee pain 10/25/2017   medial   Obesity    Persistent atrial fibrillation (HCC)    recurrent atrial flutter since 2001 s/p DCCVs, multiple failed AADs, h/o tachy-mediated cardiomyopathy   Presence of Watchman left atrial appendage closure device 06/22/2022   Watchman  31mm FLX placed with Dr. Arlester Ladd   Shortness of breath    "can come on at any time" (03/14/2013)   Sleep apnea    "dx'd; couldn't wear the mask" (03/14/2013)   Symptomatic anemia 03/08/2022   Tobacco abuse    Urinary tract infection without hematuria 05/26/2022    Surgical History: Past Surgical History:  Procedure Laterality Date   ATRIAL FLUTTER ABLATION  2002   atrial flutter; subsequently developed atrial fibrillation   AV NODE ABLATION  01/24/2013   CARDIAC CATHETERIZATION  2002   CARDIAC CATHETERIZATION N/A 10/16/2014   Procedure: Left Heart Cath and Coronary Angiography;  Surgeon: Arnoldo Lapping, MD;  Location: [facility] INVASIVE CV LAB;  Service: Cardiovascular;  Laterality: N/A;   CARDIOVERSION  05/31/2011   Procedure: CARDIOVERSION;  Surgeon: Windsor Hatcher.  Macdonald Savoy, MD;  Location: [facility] ORS;  Service: Cardiovascular;  Laterality: N/A;   CARPAL TUNNEL RELEASE Left 1980's   COLONOSCOPY WITH PROPOFOL  N/A 12/30/2018   Procedure: COLONOSCOPY WITH PROPOFOL ;  Surgeon: Luke Salaam, MD;  Location: [facility] ENDOSCOPY;  Service: Gastroenterology;  Laterality: N/A;   COLONOSCOPY WITH PROPOFOL  N/A 12/04/2019   Procedure: COLONOSCOPY WITH PROPOFOL ;  Surgeon: Luke Salaam, MD;  Location: [facility] ENDOSCOPY;  Service: Gastroenterology;  Laterality: N/A;    COLONOSCOPY WITH PROPOFOL  N/A 06/13/2021   Procedure: COLONOSCOPY WITH PROPOFOL ;  Surgeon: Luke Salaam, MD;  Location: [facility] ENDOSCOPY;  Service: Gastroenterology;  Laterality: N/A;   ESOPHAGOGASTRODUODENOSCOPY N/A 03/01/2022   Procedure: ESOPHAGOGASTRODUODENOSCOPY (EGD);  Surgeon: Ozell Blunt, MD;  Location: [facility] ENDOSCOPY;  Service: Gastroenterology;  Laterality: N/A;   ESOPHAGOGASTRODUODENOSCOPY (EGD) WITH PROPOFOL  N/A 12/21/2022   Procedure: ESOPHAGOGASTRODUODENOSCOPY (EGD) WITH PROPOFOL ;  Surgeon: Ace Holder, MD;  Location: [facility] ENDOSCOPY;  Service: Gastroenterology;  Laterality: N/A;   ESOPHAGOGASTRODUODENOSCOPY (EGD) WITH PROPOFOL  N/A 12/23/2022   Procedure: ESOPHAGOGASTRODUODENOSCOPY (EGD) WITH PROPOFOL ;  Surgeon: Ace Holder, MD;  Location: [facility] ENDOSCOPY;  Service: Gastroenterology;  Laterality: N/A;   ESOPHAGOGASTRODUODENOSCOPY (EGD) WITH PROPOFOL  N/A 03/14/2023   Procedure: ESOPHAGOGASTRODUODENOSCOPY (EGD) WITH PROPOFOL ;  Surgeon: Luke Salaam, MD;  Location: [facility] ENDOSCOPY;  Service: Gastroenterology;  Laterality: N/A;   GIVENS CAPSULE STUDY Left 03/10/2022   Procedure: GIVENS CAPSULE STUDY;  Surgeon: Evangeline Hilts, MD;  Location: [facility] ENDOSCOPY;  Service: Gastroenterology;  Laterality: Left;   HEMOSTASIS CLIP PLACEMENT  12/23/2022   Procedure: HEMOSTASIS CLIP PLACEMENT;  Surgeon: Ace Holder, MD;  Location: [facility] ENDOSCOPY;  Service: Gastroenterology;;   HEMOSTASIS CONTROL  12/21/2022   Procedure: HEMOSTASIS CONTROL;  Surgeon: Ace Holder, MD;  Location: [facility] ENDOSCOPY;  Service: Gastroenterology;;   INSERT / REPLACE / REMOVE PACEMAKER  01/24/2013    Medtronic Adapta L dual-chamber pacemaker, serial number NWE [id number]    LEFT ATRIAL APPENDAGE OCCLUSION N/A 06/22/2022   Procedure: LEFT ATRIAL APPENDAGE OCCLUSION;  Surgeon: Arnoldo Lapping, MD;  Location: [facility] INVASIVE CV LAB;  Service: Cardiovascular;  Laterality: N/A;   LEFT HEART CATH AND CORONARY ANGIOGRAPHY N/A  06/07/2018   Procedure: LEFT HEART CATH AND CORONARY ANGIOGRAPHY;  Surgeon: Arnoldo Lapping, MD;  Location: [facility] INVASIVE CV LAB;  Service: Cardiovascular;  Laterality: N/A;   LEFT HEART CATHETERIZATION WITH CORONARY ANGIOGRAM N/A 03/17/2013   Procedure: LEFT HEART CATHETERIZATION WITH CORONARY ANGIOGRAM;  Surgeon: Odie Benne, MD; LAD mild dz, D1 branch 100%, inferior branch 99%, CFX OK, RCA 50%, EF 65%     LOOP RECORDER IMPLANT  2002   PERMANENT PACEMAKER INSERTION N/A 01/24/2013   Procedure: PERMANENT PACEMAKER INSERTION;  Surgeon: Tammie Fall, MD;  Location: [facility] CATH LAB;  Service: Cardiovascular;  Laterality: N/A;   TEE WITHOUT CARDIOVERSION N/A 06/22/2022   Procedure: TRANSESOPHAGEAL ECHOCARDIOGRAM;  Surgeon: Arnoldo Lapping, MD;  Location: [facility] INVASIVE CV LAB;  Service: Cardiovascular;  Laterality: N/A;   TIBIAL TUBERCLERPLASTY  ~ 2003    Home Medications:  Allergies as of 08/08/2023       Reactions   Januvia  [sitagliptin ] Other (See Comments)   Elevated lipase   Metformin  And Related Diarrhea        Medication List        Accurate as of August 07, 2023 12:43 PM. If you have any questions, ask your nurse or doctor.          Accu-Chek Guide test strip Generic drug: glucose blood TEST FOUR TIMES A DAY   aspirin  EC  81 MG tablet Take 1 tablet (81 mg total) by mouth daily. Swallow whole.   blood glucose meter kit and supplies Kit Dispense based on patient and insurance preference. Use up to four times daily as directed. (FOR ICD-9 250.00, 250.01).   ciprofloxacin  0.3 % ophthalmic solution Commonly known as: Ciloxan  Administer 1 drop, every 2 hours, while awake, for 2 days. Then 1 drop, every 4 hours, while awake, for the next 5 days.   clopidogrel  75 MG tablet Commonly known as: PLAVIX  Take 1 tablet (75 mg total) by mouth daily.   fluticasone  50 MCG/ACT nasal spray Commonly known as: FLONASE  PLACE 1 SPRAY INTO BOTH NOSTRILS DAILY AS NEEDED FOR ALLERGIES OR  RHINITIS.   gabapentin  300 MG capsule Commonly known as: NEURONTIN  TAKE 1 CAPSULE BY MOUTH EVERY MORNING AND 2 CAPSULES BY MOUTH AT NIGHT FOR NEUROPATHY   glipiZIDE  5 MG 24 hr tablet Commonly known as: GLUCOTROL  XL Take 1 tablet (5 mg total) by mouth daily with breakfast. for diabetes.   isosorbide  mononitrate 60 MG 24 hr tablet Commonly known as: IMDUR  Take 1 tablet (60 mg total) by mouth daily.   Jardiance  25 MG Tabs tablet Generic drug: empagliflozin  TAKE 1 TABLET (25 MG TOTAL) BY MOUTH DAILY BEFORE BREAKFAST. FOR DIABETES.   lisinopril  2.5 MG tablet Commonly known as: ZESTRIL  TAKE 1 TABLET BY MOUTH EVERY DAY   nitroGLYCERIN  0.4 MG SL tablet Commonly known as: NITROSTAT  Place 1 tablet (0.4 mg total) under the tongue every 5 (five) minutes as needed for chest pain. Do not exceed 3 tablets in 24 hours.   pantoprazole  40 MG tablet Commonly known as: PROTONIX  Take 1 tablet (40 mg total) by mouth daily. For ulcer   rosuvastatin  20 MG tablet Commonly known as: CRESTOR  TAKE 1 TABLET BY MOUTH EVERY DAY IN THE EVENING FOR CHOLESTEROL   sertraline  100 MG tablet Commonly known as: ZOLOFT  TAKE 1 TABLET (100 MG TOTAL) BY MOUTH DAILY. FOR ANXIETY AND DEPRESSION.   Symbicort  160-4.5 MCG/ACT inhaler Generic drug: budesonide -formoterol  INHALE 2 PUFFS INTO THE LUNGS TWICE A DAY What changed: See the new instructions.   tamsulosin  0.4 MG Caps capsule Commonly known as: FLOMAX  Take 1 capsule (0.4 mg total) by mouth daily.   TYLENOL  500 MG tablet Generic drug: acetaminophen  Take 1,000 mg by mouth daily.   Ventolin  HFA 108 (90 Base) MCG/ACT inhaler Generic drug: albuterol  INHALE 2 PUFFS BY MOUTH EVERY 4 HOURS AS NEEDED FOR WHEEZE OR FOR SHORTNESS OF BREATH   vitamin C 1000 MG tablet Take 2,000 mg by mouth daily.        Allergies:  Allergies  Allergen Reactions   Januvia  [Sitagliptin ] Other (See Comments)    Elevated lipase   Metformin  And Related Diarrhea    Family  History: Family History  Problem Relation Age of Onset   Dementia Mother    Alzheimer's disease Mother    Osteoporosis Mother     Social History:  reports that he quit smoking about 9 years ago. His smoking use included cigarettes. He started smoking about 51 years ago. He has a 42 pack-year smoking history. He has never used smokeless tobacco. He reports that he does not currently use alcohol. He reports that he does not use drugs.  ROS: Pertinent ROS in HPI  Physical Exam: There were no vitals taken for this visit.  Constitutional:  Well nourished. Alert and oriented, No acute distress. HEENT: [place] AT, moist mucus membranes.  Trachea midline, no masses. Cardiovascular: No clubbing, cyanosis,  or edema. Respiratory: Normal respiratory effort, no increased work of breathing. GI: Abdomen is soft, non tender, non distended, no abdominal masses. Liver and spleen not palpable.  No hernias appreciated.  Stool sample for occult testing is not indicated.   GU: No CVA tenderness.  No bladder fullness or masses.  Patient with circumcised/uncircumcised phallus. ***Foreskin easily retracted***  Urethral meatus is patent.  No penile discharge. No penile lesions or rashes. Scrotum without lesions, cysts, rashes and/or edema.  Testicles are located scrotally bilaterally. No masses are appreciated in the testicles. Left and right epididymis are normal. Rectal: Patient with  normal sphincter tone. Anus and perineum without scarring or rashes. No rectal masses are appreciated. Prostate is approximately *** grams, *** nodules are appreciated. Seminal vesicles are normal. Skin: No rashes, bruises or suspicious lesions. Lymph: No cervical or inguinal adenopathy. Neurologic: Grossly intact, no focal deficits, moving all 4 extremities. Psychiatric: Normal mood and affect.   Laboratory Data: Results for orders placed or performed in visit on 08/06/23  TSH   Collection Time: 08/06/23  3:23 PM  Result Value Ref  Range   TSH 1.920 0.450 - 4.500 uIU/mL  T4, free   Collection Time: 08/06/23  3:23 PM  Result Value Ref Range   Free T4 0.86 0.82 - 1.77 ng/dL   *Note: Due to a large number of results and/or encounters for the requested time period, some results have not been displayed. A complete set of results can be found in Results Review.   Component     Latest Ref Rng 08/01/2023  Prostate Specific Ag, Serum     0.0 - 4.0 ng/mL <0.1    CBC    Component Value Date/Time   WBC 4.3 05/29/2023 1537   RBC 4.83 05/29/2023 1537   HGB 15.2 05/29/2023 1537   HGB 13.7 04/16/2023 1612   HCT 46.1 05/29/2023 1537   HCT 44.3 04/16/2023 1612   PLT 111 (L) 05/29/2023 1537   PLT 130 (L) 04/16/2023 1612   MCV 95.4 05/29/2023 1537   MCV 94 04/16/2023 1612   MCV 92 01/23/2013 0151   MCH 31.5 05/29/2023 1537   MCHC 33.0 05/29/2023 1537   RDW 16.1 (H) 05/29/2023 1537   RDW 16.4 (H) 04/16/2023 1612   RDW 14.0 01/23/2013 0151   LYMPHSABS 1.6 05/29/2023 1537   LYMPHSABS 1.3 04/16/2023 1612   LYMPHSABS 3.7 (H) 01/23/2013 0151   MONOABS 0.3 05/29/2023 1537   MONOABS 0.7 01/23/2013 0151   EOSABS 0.1 05/29/2023 1537   EOSABS 0.2 04/16/2023 1612   EOSABS 0.4 01/23/2013 0151   BASOSABS 0.0 05/29/2023 1537   BASOSABS 0.0 04/16/2023 1612   BASOSABS 0.0 01/23/2013 0151    CMP     Component Value Date/Time   NA 138 05/29/2023 1537   NA 145 (H) 07/24/2022 0933   NA 141 01/23/2013 0151   K 4.2 05/29/2023 1537   K 3.4 (L) 01/23/2013 0151   CL 103 05/29/2023 1537   CL 107 01/23/2013 0151   CO2 25 05/29/2023 1537   CO2 26 01/23/2013 0151   GLUCOSE 81 05/29/2023 1537   GLUCOSE 97 01/23/2013 0151   BUN 19 05/29/2023 1537   BUN 23 07/24/2022 0933   BUN 23 (H) 01/23/2013 0151   CREATININE 1.19 05/29/2023 1537   CREATININE 1.06 05/27/2021 1515   CALCIUM  9.7 05/29/2023 1537   CALCIUM  8.4 (L) 01/23/2013 0151   PROT 6.5 05/29/2023 1537   PROT 6.3 05/28/2019 1202   PROT 6.5 01/23/2013 0151  ALBUMIN  4.2  05/29/2023 1537   ALBUMIN  4.4 05/28/2019 1202   ALBUMIN  3.5 01/23/2013 0151   AST 25 05/29/2023 1537   AST 17 01/23/2013 0151   ALT 22 05/29/2023 1537   ALT 27 01/23/2013 0151   ALKPHOS 45 05/29/2023 1537   ALKPHOS 83 01/23/2013 0151   BILITOT 0.8 05/29/2023 1537   BILITOT 0.4 05/28/2019 1202   BILITOT 0.3 01/23/2013 0151   GFR 69.47 01/02/2023 1220   EGFR 77 07/24/2022 0933   GFRNONAA >60 05/29/2023 1537   GFRNONAA 80 03/28/2017 1433  I have reviewed the labs.   Pertinent Imaging: N/A   Assessment & Plan:    1.  High risk hematuria -Former smoker -Recent workup (07/2022) significant for radiation changes in the prostate -No reports of gross hematuria  2. ED - insurance would not cover the IPP -His partner has medical issues that he is not sexually active at this time  3. Prostate cancer -PSA undetectable -Continue PSA every 12 months  4. BPH with LUTS -continue conservative management, avoiding bladder irritants and timed voiding's -Continue tamsulosin  0.4 mg daily     No follow-ups on file.  These notes generated with voice recognition software. I apologize for typographical errors.  Briant Camper  [facility] [address]  [address] [place], [place] [address] [phone]

## 2023-08-08 ENCOUNTER — Encounter: Payer: Self-pay | Admitting: Family Medicine

## 2023-08-08 ENCOUNTER — Ambulatory Visit (INDEPENDENT_AMBULATORY_CARE_PROVIDER_SITE_OTHER): Admitting: Family Medicine

## 2023-08-08 ENCOUNTER — Encounter: Payer: Self-pay | Admitting: Urology

## 2023-08-08 ENCOUNTER — Ambulatory Visit (INDEPENDENT_AMBULATORY_CARE_PROVIDER_SITE_OTHER): Payer: Self-pay | Admitting: Urology

## 2023-08-08 VITALS — BP 119/76 | HR 74 | Ht 71.0 in | Wt 207.2 lb

## 2023-08-08 VITALS — BP 100/60 | HR 81 | Temp 97.8°F | Ht 71.0 in | Wt 209.0 lb

## 2023-08-08 DIAGNOSIS — N401 Enlarged prostate with lower urinary tract symptoms: Secondary | ICD-10-CM | POA: Diagnosis not present

## 2023-08-08 DIAGNOSIS — M5442 Lumbago with sciatica, left side: Secondary | ICD-10-CM

## 2023-08-08 DIAGNOSIS — N138 Other obstructive and reflux uropathy: Secondary | ICD-10-CM | POA: Diagnosis not present

## 2023-08-08 DIAGNOSIS — R319 Hematuria, unspecified: Secondary | ICD-10-CM | POA: Diagnosis not present

## 2023-08-08 DIAGNOSIS — N529 Male erectile dysfunction, unspecified: Secondary | ICD-10-CM | POA: Diagnosis not present

## 2023-08-08 DIAGNOSIS — C61 Malignant neoplasm of prostate: Secondary | ICD-10-CM

## 2023-08-08 MED ORDER — TIZANIDINE HCL 4 MG PO TABS: 4.0000 mg | ORAL_TABLET | Freq: Every evening | ORAL | 0 refills | Status: DC | PRN

## 2023-08-08 MED ORDER — TRAMADOL HCL 50 MG PO TABS: 50.0000 mg | ORAL_TABLET | Freq: Three times a day (TID) | ORAL | 0 refills | Status: DC | PRN

## 2023-08-08 MED ORDER — PREDNISONE 20 MG PO TABS: ORAL_TABLET | ORAL | 0 refills | Status: DC

## 2023-08-08 NOTE — Progress Notes (Unsigned)
     [name] Harren T. [name] Greff, MD, CAQ Sports Medicine [facility] [address] [place], [address]  Phone: [phone]  FAX: [phone]  [name] Cook - 73 y.o. male  MRN [id number]  Date of Birth: 04-Oct-1950  Date: 08/08/2023  PCP: Gabriel John, NP  Referral: Gabriel John, NP  Chief Complaint  Patient presents with   Back Pain    Tripped over concrete block in parking lot last week   Subjective:   [name] Cook is a 72 y.o. very pleasant male patient with Body mass index is 29.15 kg/m. who presents with the following:  Tripped and twisted, hurt - paver before Lowest on the left  Tried some icy hot Plavix   L rad to the L knee in the back  Lab Results  Component Value Date   HGBA1C 5.5 05/01/2023      Review of Systems is noted in the HPI, as appropriate  Objective:   BP 100/60 (BP Location: Right Arm, Patient Position: Sitting, Cuff Size: Large)   Pulse 81   Temp 97.8 F (36.6 C) (Temporal)   Ht 5\' 11"  (1.803 m)   Wt 209 lb (94.8 kg)   SpO2 95%   BMI 29.15 kg/m   GEN: No acute distress; alert,appropriate. PULM: Breathing comfortably in no respiratory distress PSYCH: Normally interactive.   Laboratory and Imaging Data:  Assessment and Plan:   ***

## 2023-08-09 DIAGNOSIS — H0288A Meibomian gland dysfunction right eye, upper and lower eyelids: Secondary | ICD-10-CM | POA: Diagnosis not present

## 2023-08-09 DIAGNOSIS — E119 Type 2 diabetes mellitus without complications: Secondary | ICD-10-CM | POA: Diagnosis not present

## 2023-08-09 DIAGNOSIS — H0288B Meibomian gland dysfunction left eye, upper and lower eyelids: Secondary | ICD-10-CM | POA: Diagnosis not present

## 2023-08-09 DIAGNOSIS — H524 Presbyopia: Secondary | ICD-10-CM | POA: Diagnosis not present

## 2023-08-09 DIAGNOSIS — H2513 Age-related nuclear cataract, bilateral: Secondary | ICD-10-CM | POA: Diagnosis not present

## 2023-08-09 LAB — HM DIABETES EYE EXAM

## 2023-08-30 ENCOUNTER — Other Ambulatory Visit: Payer: Self-pay | Admitting: Family Medicine

## 2023-08-30 NOTE — Telephone Encounter (Signed)
 Last office visit 08/08/2023 with Dr. Geralyn Knee for back pain.  Last refilled 08/08/2023 for #30 with no refills.  Next Appt: CPE 10/30/2023 with PCP.

## 2023-09-13 NOTE — Progress Notes (Signed)
 Remote pacemaker transmission.

## 2023-09-13 NOTE — Addendum Note (Signed)
 Addended by: Edra Govern D on: 09/13/2023 09:40 AM   Modules accepted: Orders

## 2023-09-14 NOTE — Progress Notes (Unsigned)
 No show

## 2023-09-18 ENCOUNTER — Ambulatory Visit (INDEPENDENT_AMBULATORY_CARE_PROVIDER_SITE_OTHER): Payer: Self-pay | Admitting: Psychiatry

## 2023-09-18 DIAGNOSIS — Z91199 Patient's noncompliance with other medical treatment and regimen due to unspecified reason: Secondary | ICD-10-CM

## 2023-09-23 ENCOUNTER — Other Ambulatory Visit: Payer: Self-pay | Admitting: Primary Care

## 2023-09-23 DIAGNOSIS — E1142 Type 2 diabetes mellitus with diabetic polyneuropathy: Secondary | ICD-10-CM

## 2023-09-29 ENCOUNTER — Other Ambulatory Visit: Payer: Self-pay | Admitting: Family Medicine

## 2023-10-02 NOTE — Telephone Encounter (Signed)
 Last office visit 08/08/2023 with Dr. Geralyn Knee for back pain.  Last refilled 08/30/23 for #30 with 1 refills.  Next Appt: CPE 10/30/2023 with PCP.

## 2023-10-03 ENCOUNTER — Other Ambulatory Visit: Payer: Self-pay | Admitting: Gastroenterology

## 2023-10-03 DIAGNOSIS — K269 Duodenal ulcer, unspecified as acute or chronic, without hemorrhage or perforation: Secondary | ICD-10-CM

## 2023-10-10 ENCOUNTER — Other Ambulatory Visit: Payer: Self-pay | Admitting: Primary Care

## 2023-10-10 DIAGNOSIS — F419 Anxiety disorder, unspecified: Secondary | ICD-10-CM

## 2023-10-10 DIAGNOSIS — J439 Emphysema, unspecified: Secondary | ICD-10-CM

## 2023-10-10 DIAGNOSIS — E1142 Type 2 diabetes mellitus with diabetic polyneuropathy: Secondary | ICD-10-CM

## 2023-10-12 ENCOUNTER — Encounter (INDEPENDENT_AMBULATORY_CARE_PROVIDER_SITE_OTHER): Payer: Self-pay | Admitting: Otolaryngology

## 2023-10-12 ENCOUNTER — Ambulatory Visit (INDEPENDENT_AMBULATORY_CARE_PROVIDER_SITE_OTHER): Admitting: Otolaryngology

## 2023-10-12 VITALS — BP 92/56 | HR 85 | Ht 71.0 in | Wt 209.0 lb

## 2023-10-12 DIAGNOSIS — Z91198 Patient's noncompliance with other medical treatment and regimen for other reason: Secondary | ICD-10-CM | POA: Diagnosis not present

## 2023-10-12 DIAGNOSIS — Z789 Other specified health status: Secondary | ICD-10-CM

## 2023-10-12 DIAGNOSIS — G4733 Obstructive sleep apnea (adult) (pediatric): Secondary | ICD-10-CM | POA: Diagnosis not present

## 2023-10-12 NOTE — Progress Notes (Unsigned)
 Dear Dr. Gretta, Here is my assessment for our mutual patient, [name] Cook. Thank you for allowing me the opportunity to care for your patient. Please do not hesitate to contact me should you have any other questions. Sincerely, Dr. Eldora Blanch  Otolaryngology Clinic Note Referring provider: Dr. Gretta HPI:  [name] Cook is a 73 y.o. male kindly referred by Dr. Gretta for evaluation of OSA with CPAP intolerance .  Diagnosed many years ago, only used CPAP for a couple of weeks at the most but could not tolerate it. He has not had a sleep study in several years (unable to review results) Using CPAP: has not used it for several years.  Interventions: did try multiple msaks Cannot tolerate it - he reports that he had to take it off because it was uncomfortable and felt like he could not breathe well using it.  Insomnia: no Chest surgery: Pacemaker Denies dysphonia, dysphagia  Personal or FHx of bleeding dz or anesthesia difficulty: no  AP/AC: yes  Tobacco: prior, quit. Alcohol: none  PMHx: GI Bleed, T2DM, CKD, HTN, CAD with pacemaker, OSA, COPD  Independent Review of Additional Tests or Records:  Comer Gretta, NP (06/01/2023): continues to feel tired, not using CPAP; Dx: OSA; Rx: ref to ENT for possible inspire CBC and CMP 05/29/2023: WBC 4.3, Eos 100; Plt 111; CMP: BUN/Cr 19/1.19 Dr. Verdia (10/26/2017): noted OSA sx and signs, intolerant to CPAP; tested and positive for OSA about 10 years ago and was started on CPAP; unable to tolerate it; Dx: OSA; Rx: repeat sleep study   PMH/Meds/All/SocHx/FamHx/ROS:   Past Medical History:  Diagnosis Date   Acute bronchitis with COPD (HCC) 03/07/2021   Acute pulmonary embolism (HCC) 03/08/2022   Arthritis    knees and lower back (03/14/2013)   Atrial flutter (HCC)    radiofrequency ablation in 2001   CAD (coronary artery disease)    a. Nonobstructive. Cardiac cath in 2001-50% mid RI, normal LM, LAD, RCA b. cath 10/16/2014 95% mid RCA  treated with DES, 99% ost D1 medical management due to small aneurysmal segment   Chronic anticoagulation    chronic Coumadin  anticoagulation   Chronic obstructive pulmonary disease (HCC) 04/20/2011   Diabetes mellitus, type 2 (HCC)    Diaphoresis 10/26/2021   Elevated lipase 06/23/2021   GERD (gastroesophageal reflux disease)    GIB (gastrointestinal bleeding) 03/08/2022   Hyperlipidemia    Hypertension    with hypertensive heart disease   Left knee pain 10/25/2017   medial   Obesity    Persistent atrial fibrillation (HCC)    recurrent atrial flutter since 2001 s/p DCCVs, multiple failed AADs, h/o tachy-mediated cardiomyopathy   Presence of Watchman left atrial appendage closure device 06/22/2022   Watchman  31mm FLX placed with Dr. Wonda   Shortness of breath    can come on at any time (03/14/2013)   Sleep apnea    dx'd; couldn't wear the mask (03/14/2013)   Symptomatic anemia 03/08/2022   Tobacco abuse    Urinary tract infection without hematuria 05/26/2022     Past Surgical History:  Procedure Laterality Date   ATRIAL FLUTTER ABLATION  2002   atrial flutter; subsequently developed atrial fibrillation   AV NODE ABLATION  01/24/2013   CARDIAC CATHETERIZATION  2002   CARDIAC CATHETERIZATION N/A 10/16/2014   Procedure: Left Heart Cath and Coronary Angiography;  Surgeon: Ozell Wonda, MD;  Location: [facility] INVASIVE CV LAB;  Service: Cardiovascular;  Laterality: N/A;   CARDIOVERSION  05/31/2011   Procedure:  CARDIOVERSION;  Surgeon: Lamar HERO. Juventino, MD;  Location: [facility] ORS;  Service: Cardiovascular;  Laterality: N/A;   CARPAL TUNNEL RELEASE Left 1980's   COLONOSCOPY WITH PROPOFOL  N/A 12/30/2018   Procedure: COLONOSCOPY WITH PROPOFOL ;  Surgeon: Therisa Bi, MD;  Location: [facility] ENDOSCOPY;  Service: Gastroenterology;  Laterality: N/A;   COLONOSCOPY WITH PROPOFOL  N/A 12/04/2019   Procedure: COLONOSCOPY WITH PROPOFOL ;  Surgeon: Therisa Bi, MD;  Location: [facility] ENDOSCOPY;  Service:  Gastroenterology;  Laterality: N/A;   COLONOSCOPY WITH PROPOFOL  N/A 06/13/2021   Procedure: COLONOSCOPY WITH PROPOFOL ;  Surgeon: Therisa Bi, MD;  Location: [facility] ENDOSCOPY;  Service: Gastroenterology;  Laterality: N/A;   ESOPHAGOGASTRODUODENOSCOPY N/A 03/01/2022   Procedure: ESOPHAGOGASTRODUODENOSCOPY (EGD);  Surgeon: Rosalie Kitchens, MD;  Location: [facility];  Service: Gastroenterology;  Laterality: N/A;   ESOPHAGOGASTRODUODENOSCOPY (EGD) WITH PROPOFOL  N/A 12/21/2022   Procedure: ESOPHAGOGASTRODUODENOSCOPY (EGD) WITH PROPOFOL ;  Surgeon: Leigh Elspeth SQUIBB, MD;  Location: [facility] ENDOSCOPY;  Service: Gastroenterology;  Laterality: N/A;   ESOPHAGOGASTRODUODENOSCOPY (EGD) WITH PROPOFOL  N/A 12/23/2022   Procedure: ESOPHAGOGASTRODUODENOSCOPY (EGD) WITH PROPOFOL ;  Surgeon: Leigh Elspeth SQUIBB, MD;  Location: [facility] ENDOSCOPY;  Service: Gastroenterology;  Laterality: N/A;   ESOPHAGOGASTRODUODENOSCOPY (EGD) WITH PROPOFOL  N/A 03/14/2023   Procedure: ESOPHAGOGASTRODUODENOSCOPY (EGD) WITH PROPOFOL ;  Surgeon: Therisa Bi, MD;  Location: [facility] ENDOSCOPY;  Service: Gastroenterology;  Laterality: N/A;   GIVENS CAPSULE STUDY Left 03/10/2022   Procedure: GIVENS CAPSULE STUDY;  Surgeon: Burnette Fallow, MD;  Location: [facility] ENDOSCOPY;  Service: Gastroenterology;  Laterality: Left;   HEMOSTASIS CLIP PLACEMENT  12/23/2022   Procedure: HEMOSTASIS CLIP PLACEMENT;  Surgeon: Leigh Elspeth SQUIBB, MD;  Location: [facility] ENDOSCOPY;  Service: Gastroenterology;;   HEMOSTASIS CONTROL  12/21/2022   Procedure: HEMOSTASIS CONTROL;  Surgeon: Leigh Elspeth SQUIBB, MD;  Location: [facility] ENDOSCOPY;  Service: Gastroenterology;;   INSERT / REPLACE / REMOVE PACEMAKER  01/24/2013    Medtronic Adapta L dual-chamber pacemaker, serial number [id number]    LEFT ATRIAL APPENDAGE OCCLUSION N/A 06/22/2022   Procedure: LEFT ATRIAL APPENDAGE OCCLUSION;  Surgeon: Wonda Sharper, MD;  Location: [facility] INVASIVE CV LAB;  Service: Cardiovascular;  Laterality: N/A;   LEFT HEART  CATH AND CORONARY ANGIOGRAPHY N/A 06/07/2018   Procedure: LEFT HEART CATH AND CORONARY ANGIOGRAPHY;  Surgeon: Wonda Sharper, MD;  Location: [facility] INVASIVE CV LAB;  Service: Cardiovascular;  Laterality: N/A;   LEFT HEART CATHETERIZATION WITH CORONARY ANGIOGRAM N/A 03/17/2013   Procedure: LEFT HEART CATHETERIZATION WITH CORONARY ANGIOGRAM;  Surgeon: Lonni JONETTA Cash, MD; LAD mild dz, D1 branch 100%, inferior branch 99%, CFX OK, RCA 50%, EF 65%     LOOP RECORDER IMPLANT  2002   PERMANENT PACEMAKER INSERTION N/A 01/24/2013   Procedure: PERMANENT PACEMAKER INSERTION;  Surgeon: Danelle LELON Birmingham, MD;  Location: [facility] CATH LAB;  Service: Cardiovascular;  Laterality: N/A;   TEE WITHOUT CARDIOVERSION N/A 06/22/2022   Procedure: TRANSESOPHAGEAL ECHOCARDIOGRAM;  Surgeon: Wonda Sharper, MD;  Location: [facility] INVASIVE CV LAB;  Service: Cardiovascular;  Laterality: N/A;   TIBIAL TUBERCLERPLASTY  ~ 2003    Family History  Problem Relation Age of Onset   Dementia Mother    Alzheimer's disease Mother    Osteoporosis Mother      Social Connections: Socially Isolated (10/10/2022)   Social Connection and Isolation Panel    Frequency of Communication with Friends and Family: More than three times a week    Frequency of Social Gatherings with Friends and Family: Three times a week    Attends Religious Services: Never    Active Member of Clubs or  Organizations: No    Attends Banker Meetings: Never    Marital Status: Widowed      Current Outpatient Medications:    albuterol  (VENTOLIN  HFA) 108 (90 Base) MCG/ACT inhaler, INHALE 2 PUFFS BY MOUTH EVERY 4 HOURS AS NEEDED FOR WHEEZE OR FOR SHORTNESS OF BREATH, Disp: 18 each, Rfl: 0   Ascorbic Acid (VITAMIN C) 1000 MG tablet, Take 2,000 mg by mouth daily., Disp: , Rfl:    aspirin  EC 81 MG tablet, Take 1 tablet (81 mg total) by mouth daily. Swallow whole., Disp: , Rfl:    blood glucose meter kit and supplies KIT, Dispense based on patient and insurance  preference. Use up to four times daily as directed. (FOR ICD-9 250.00, 250.01)., Disp: 1 each, Rfl: 0   budesonide -formoterol  (SYMBICORT ) 160-4.5 MCG/ACT inhaler, Inhale 2 puffs into the lungs 2 (two) times daily as needed., Disp: , Rfl:    ciprofloxacin  (CILOXAN ) 0.3 % ophthalmic solution, Administer 1 drop, every 2 hours, while awake, for 2 days. Then 1 drop, every 4 hours, while awake, for the next 5 days., Disp: 5 mL, Rfl: 0   clopidogrel  (PLAVIX ) 75 MG tablet, Take 1 tablet (75 mg total) by mouth daily., Disp: 90 tablet, Rfl: 2   empagliflozin  (JARDIANCE ) 25 MG TABS tablet, TAKE 1 TABLET (25 MG TOTAL) BY MOUTH DAILY BEFORE BREAKFAST. FOR DIABETES., Disp: 90 tablet, Rfl: 1   fluticasone  (FLONASE ) 50 MCG/ACT nasal spray, PLACE 1 SPRAY INTO BOTH NOSTRILS DAILY AS NEEDED FOR ALLERGIES OR RHINITIS., Disp: 48 g, Rfl: 0   gabapentin  (NEURONTIN ) 300 MG capsule, TAKE 1 CAPSULE BY MOUTH EVERY MORNING AND 2 CAPSULES BY MOUTH AT NIGHT FOR NEUROPATHY, Disp: 270 capsule, Rfl: 0   glipiZIDE  (GLUCOTROL  XL) 5 MG 24 hr tablet, Take 1 tablet (5 mg total) by mouth daily with breakfast. for diabetes., Disp: 90 tablet, Rfl: 1   glucose blood (ACCU-CHEK GUIDE) test strip, TEST FOUR TIMES A DAY, Disp: 400 strip, Rfl: 0   isosorbide  mononitrate (IMDUR ) 60 MG 24 hr tablet, Take 1 tablet (60 mg total) by mouth daily., Disp: 90 tablet, Rfl: 3   lisinopril  (ZESTRIL ) 2.5 MG tablet, TAKE 1 TABLET BY MOUTH EVERY DAY, Disp: 90 tablet, Rfl: 0   nitroGLYCERIN  (NITROSTAT ) 0.4 MG SL tablet, Place 1 tablet (0.4 mg total) under the tongue every 5 (five) minutes as needed for chest pain. Do not exceed 3 tablets in 24 hours., Disp: 25 tablet, Rfl: 0   pantoprazole  (PROTONIX ) 40 MG tablet, Take 1 tablet (40 mg total) by mouth daily. For ulcer, Disp: 90 tablet, Rfl: 1   predniSONE  (DELTASONE ) 20 MG tablet, 2 tabs po daily for 5 days, then 1 tab po daily for 5 days, Disp: 15 tablet, Rfl: 0   rosuvastatin  (CRESTOR ) 20 MG tablet, TAKE 1  TABLET BY MOUTH EVERY DAY IN THE EVENING FOR CHOLESTEROL, Disp: 90 tablet, Rfl: 2   sertraline  (ZOLOFT ) 100 MG tablet, Take 150 mg by mouth daily., Disp: , Rfl:    tamsulosin  (FLOMAX ) 0.4 MG CAPS capsule, Take 1 capsule (0.4 mg total) by mouth daily., Disp: 90 capsule, Rfl: 3   tiZANidine  (ZANAFLEX ) 4 MG tablet, TAKE 1 TABLET BY MOUTH AT BEDTIME AS NEEDED FOR MUSCLE SPASMS., Disp: 30 tablet, Rfl: 1   traMADol  (ULTRAM ) 50 MG tablet, Take 1 tablet (50 mg total) by mouth every 8 (eight) hours as needed for moderate pain (pain score 4-6)., Disp: 20 tablet, Rfl: 0   TYLENOL  500 MG tablet, Take 1,000  mg by mouth daily., Disp: , Rfl:    Physical Exam:   BP (!) 92/56 (BP Location: Left Arm, Patient Position: Sitting, Cuff Size: Large)   Pulse 85   Ht 5' 11 (1.803 m)   Wt 209 lb (94.8 kg)   SpO2 91%   BMI 29.15 kg/m   Salient findings:  CN II-XII intact *** Bilateral EAC clear and TM intact with well pneumatized middle ear spaces Weber 512: *** Rinne 512: AC > BC b/l *** Rine 1024: AC > BC b/l *** Anterior rhinoscopy: Septum ***; bilateral inferior turbinates with *** No lesions of oral cavity/oropharynx; dentition *** No obviously palpable neck masses/lymphadenopathy/thyromegaly No respiratory distress or stridor***  Seprately Identifiable Procedures:  Prior to initiating any procedures, risks/benefits/alternatives were explained to the patient and verbal consent obtained. Procedure Note Pre-procedure diagnosis:  Obstructive sleep apnea, rule out structural cause Post-procedure diagnosis: Same Procedure: Transnasal Fiberoptic Laryngoscopy, CPT 31575 - Mod 25 Indication: *** Complications: None apparent EBL: 0 mL  The procedure was undertaken to further evaluate the patient's complaint of ***, with mirror exam inadequate for appropriate examination due to gag reflex and poor patient tolerance  Procedure:  Patient was identified as correct patient. Verbal consent was obtained. The nose  was sprayed with oxymetazoline and 4% lidocaine . The The flexible laryngoscope was passed through the nose to view the nasal cavity, pharynx (oropharynx, hypopharynx) and larynx.  The larynx was examined at rest and during multiple phonatory tasks. Documentation was obtained and reviewed with patient. The scope was removed. The patient tolerated the procedure well.  Findings: The nasal cavity and nasopharynx did not reveal any masses or lesions, mucosa appeared to be without obvious lesions. The tongue base, pharyngeal walls, piriform sinuses, vallecula, epiglottis and postcricoid region are normal in appearance EXCEPT: ***. The visualized portion of the subglottis and proximal trachea is widely patent. The vocal folds are mobile bilaterally. There are no lesions on the free edge of the vocal folds nor elsewhere in the larynx worrisome for malignancy.    Electronically signed by: Eldora KATHEE Blanch, MD 10/12/2023 3:56 PM   Impression & Plans:  Jw Covin is a 73 y.o. male with ***  No diagnosis found.  We discussed options including continued CPAP v/s Inspire. ***  We had a discussion regarding risks of Inspire including lack of benefit, persistent symptoms, pneumothorax, tongue soreness or weakness, Floor of mouth numbness, injury to major vessels, hematoma, implant infection, bleeding, scarring, tethering of neck, persistent symptoms, need for further procedures, and risk of anesthesia among others.    - f/u ***  See below regarding exact medications prescribed this encounter including dosages and route: No orders of the defined types were placed in this encounter.     Thank you for allowing me the opportunity to care for your patient. Please do not hesitate to contact me should you have any other questions.  Sincerely, Eldora Blanch, MD Otolaryngologist (ENT), [facility] Phone: [phone] Fax: [phone]  10/12/2023, 3:56 PM   MDM:  Level *** Complexity/Problems  addressed: *** Data complexity: *** independent review of *** - Morbidity: ***  - Prescription Drug prescribed or managed: ***

## 2023-10-15 ENCOUNTER — Encounter (INDEPENDENT_AMBULATORY_CARE_PROVIDER_SITE_OTHER): Payer: Self-pay

## 2023-10-17 ENCOUNTER — Other Ambulatory Visit: Payer: Self-pay | Admitting: Primary Care

## 2023-10-17 DIAGNOSIS — J309 Allergic rhinitis, unspecified: Secondary | ICD-10-CM

## 2023-10-17 DIAGNOSIS — E1165 Type 2 diabetes mellitus with hyperglycemia: Secondary | ICD-10-CM

## 2023-10-24 ENCOUNTER — Other Ambulatory Visit: Payer: Self-pay | Admitting: Primary Care

## 2023-10-24 DIAGNOSIS — F419 Anxiety disorder, unspecified: Secondary | ICD-10-CM

## 2023-10-25 NOTE — Telephone Encounter (Signed)
 Unable to reach patient. Left voicemail to return call to our office.

## 2023-10-25 NOTE — Telephone Encounter (Signed)
 Please call patient:  Received refill request for sertraline  anxiety/depression medication.  Is he still taking this?  If so, is he taking 100 mg?  150 mg?  Basically 1 pill or 1-1/2 pills?

## 2023-10-26 NOTE — Telephone Encounter (Signed)
 Requested Prescriptions   Signed Prescriptions Disp Refills   sertraline  (ZOLOFT ) 100 MG tablet 135 tablet 0    Sig: Take 1.5 tablets (150 mg total) by mouth daily. for anxiety and depression.    Authorizing Provider: Ryen Rhames K   Refill(s) sent to pharmacy.

## 2023-10-26 NOTE — Telephone Encounter (Signed)
 Unable to reach patient. Left voicemail to return call to our office.

## 2023-10-26 NOTE — Telephone Encounter (Signed)
 Patient called back in stated he is taking 150 mg daily and does need a refill.

## 2023-10-27 ENCOUNTER — Other Ambulatory Visit: Payer: Self-pay | Admitting: Primary Care

## 2023-10-27 DIAGNOSIS — E1165 Type 2 diabetes mellitus with hyperglycemia: Secondary | ICD-10-CM

## 2023-10-29 ENCOUNTER — Ambulatory Visit (INDEPENDENT_AMBULATORY_CARE_PROVIDER_SITE_OTHER): Payer: Medicare Other

## 2023-10-29 DIAGNOSIS — I442 Atrioventricular block, complete: Secondary | ICD-10-CM | POA: Diagnosis not present

## 2023-10-30 ENCOUNTER — Encounter: Payer: Self-pay | Admitting: Primary Care

## 2023-10-30 ENCOUNTER — Other Ambulatory Visit: Payer: Self-pay | Admitting: Primary Care

## 2023-10-30 ENCOUNTER — Ambulatory Visit (INDEPENDENT_AMBULATORY_CARE_PROVIDER_SITE_OTHER): Payer: 59 | Admitting: Primary Care

## 2023-10-30 ENCOUNTER — Other Ambulatory Visit: Payer: Self-pay

## 2023-10-30 ENCOUNTER — Ambulatory Visit: Payer: Self-pay | Admitting: Primary Care

## 2023-10-30 VITALS — BP 98/62 | HR 81 | Temp 97.1°F | Ht 71.0 in | Wt 206.0 lb

## 2023-10-30 DIAGNOSIS — J439 Emphysema, unspecified: Secondary | ICD-10-CM

## 2023-10-30 DIAGNOSIS — E1165 Type 2 diabetes mellitus with hyperglycemia: Secondary | ICD-10-CM

## 2023-10-30 DIAGNOSIS — I952 Hypotension due to drugs: Secondary | ICD-10-CM | POA: Diagnosis not present

## 2023-10-30 DIAGNOSIS — E785 Hyperlipidemia, unspecified: Secondary | ICD-10-CM

## 2023-10-30 DIAGNOSIS — Z7984 Long term (current) use of oral hypoglycemic drugs: Secondary | ICD-10-CM

## 2023-10-30 DIAGNOSIS — I10 Essential (primary) hypertension: Secondary | ICD-10-CM | POA: Diagnosis not present

## 2023-10-30 DIAGNOSIS — Z0001 Encounter for general adult medical examination with abnormal findings: Secondary | ICD-10-CM | POA: Diagnosis not present

## 2023-10-30 DIAGNOSIS — I200 Unstable angina: Secondary | ICD-10-CM

## 2023-10-30 DIAGNOSIS — I4891 Unspecified atrial fibrillation: Secondary | ICD-10-CM

## 2023-10-30 DIAGNOSIS — I959 Hypotension, unspecified: Secondary | ICD-10-CM | POA: Insufficient documentation

## 2023-10-30 DIAGNOSIS — K219 Gastro-esophageal reflux disease without esophagitis: Secondary | ICD-10-CM | POA: Diagnosis not present

## 2023-10-30 DIAGNOSIS — F32A Depression, unspecified: Secondary | ICD-10-CM

## 2023-10-30 DIAGNOSIS — Z122 Encounter for screening for malignant neoplasm of respiratory organs: Secondary | ICD-10-CM

## 2023-10-30 DIAGNOSIS — Z87891 Personal history of nicotine dependence: Secondary | ICD-10-CM

## 2023-10-30 DIAGNOSIS — F419 Anxiety disorder, unspecified: Secondary | ICD-10-CM

## 2023-10-30 DIAGNOSIS — I25119 Atherosclerotic heart disease of native coronary artery with unspecified angina pectoris: Secondary | ICD-10-CM | POA: Diagnosis not present

## 2023-10-30 DIAGNOSIS — Z0000 Encounter for general adult medical examination without abnormal findings: Secondary | ICD-10-CM | POA: Diagnosis not present

## 2023-10-30 DIAGNOSIS — Z8546 Personal history of malignant neoplasm of prostate: Secondary | ICD-10-CM

## 2023-10-30 DIAGNOSIS — G4733 Obstructive sleep apnea (adult) (pediatric): Secondary | ICD-10-CM

## 2023-10-30 DIAGNOSIS — R251 Tremor, unspecified: Secondary | ICD-10-CM

## 2023-10-30 DIAGNOSIS — E1340 Other specified diabetes mellitus with diabetic neuropathy, unspecified: Secondary | ICD-10-CM

## 2023-10-30 LAB — LIPID PANEL
Cholesterol: 136 mg/dL (ref 0–200)
HDL: 58 mg/dL (ref >39.00)
LDL Cholesterol: 61 mg/dL (ref 0–99)
NonHDL: 77.62
Total CHOL/HDL Ratio: 2
Triglycerides: 81 mg/dL (ref 0.0–149.0)
VLDL: 16.2 mg/dL (ref 0.0–40.0)

## 2023-10-30 LAB — CUP PACEART REMOTE DEVICE CHECK
Battery Impedance: 1918 Ohm
Battery Remaining Longevity: 39 mo
Battery Voltage: 2.76 V
Brady Statistic RV Percent Paced: 100 %
Date Time Interrogation Session: 20250714125302
Implantable Lead Connection Status: 753985
Implantable Lead Connection Status: 753985
Implantable Lead Implant Date: 20141010
Implantable Lead Implant Date: 20141010
Implantable Lead Location: 753859
Implantable Lead Location: 753860
Implantable Lead Model: 5076
Implantable Lead Model: 5076
Implantable Pulse Generator Implant Date: 20141010
Lead Channel Impedance Value: 537 Ohm
Lead Channel Impedance Value: 67 Ohm
Lead Channel Pacing Threshold Amplitude: 0.75 V
Lead Channel Pacing Threshold Pulse Width: 0.4 ms
Lead Channel Setting Pacing Amplitude: 2.5 V
Lead Channel Setting Pacing Pulse Width: 0.4 ms
Lead Channel Setting Sensing Sensitivity: 4 mV
Zone Setting Status: 755011
Zone Setting Status: 755011

## 2023-10-30 LAB — COMPREHENSIVE METABOLIC PANEL WITH GFR
ALT: 20 U/L (ref 0–53)
AST: 22 U/L (ref 0–37)
Albumin: 4.4 g/dL (ref 3.5–5.2)
Alkaline Phosphatase: 55 U/L (ref 39–117)
BUN: 22 mg/dL (ref 6–23)
CO2: 29 meq/L (ref 19–32)
Calcium: 9 mg/dL (ref 8.4–10.5)
Chloride: 104 meq/L (ref 96–112)
Creatinine, Ser: 1.14 mg/dL (ref 0.40–1.50)
GFR: 64.73 mL/min (ref >60.00)
Glucose, Bld: 102 mg/dL — ABNORMAL HIGH (ref 70–99)
Potassium: 4.5 meq/L (ref 3.5–5.1)
Sodium: 140 meq/L (ref 135–145)
Total Bilirubin: 0.8 mg/dL (ref 0.2–1.2)
Total Protein: 6.3 g/dL (ref 6.0–8.3)

## 2023-10-30 LAB — HEMOGLOBIN A1C: Hgb A1c MFr Bld: 6.6 % — ABNORMAL HIGH (ref 4.6–6.5)

## 2023-10-30 MED ORDER — NITROGLYCERIN 0.4 MG SL SUBL: 0.4000 mg | SUBLINGUAL_TABLET | SUBLINGUAL | 0 refills | Status: AC | PRN

## 2023-10-30 MED ORDER — PRIMIDONE 50 MG PO TABS: 25.0000 mg | ORAL_TABLET | Freq: Every day | ORAL | 0 refills | Status: DC

## 2023-10-30 NOTE — Assessment & Plan Note (Signed)
Immunizations UTD. Colonoscopy UTD, due 2026 PSA UTD  Discussed the importance of a healthy diet and regular exercise in order for weight loss, and to reduce the risk of further co-morbidity.  Exam stable. Labs pending.  Follow up in 1 year for repeat physical.

## 2023-10-30 NOTE — Assessment & Plan Note (Signed)
 Propranolol  previously ineffective, also would avoid in the setting of hypotension.  Start primidone  25 mg at bedtime.  Titrate up to 50 mg at bedtime if needed. She will update.

## 2023-10-30 NOTE — Assessment & Plan Note (Signed)
 Stable.  Continue Symbicort  160-4.5 mcg, 2 puffs twice daily Continue albuterol  inhaler as needed.  He will contact the lung cancer screening program to set up this CT for this year.

## 2023-10-30 NOTE — Assessment & Plan Note (Signed)
 Asymptomatic.  Repeat lipid panel pending today. Continue clopidogrel  75 mg daily, aspirin  81 mg daily.

## 2023-10-30 NOTE — Addendum Note (Signed)
 Addended by: ISADORA RAISIN on: 10/30/2023 04:24 PM   Modules accepted: Orders

## 2023-10-30 NOTE — Assessment & Plan Note (Signed)
 Overall stable.  Unfortunately he was a no-show for his psychiatry appointment.  He will call to reschedule.  Continue Zoloft  150 mg daily for now.

## 2023-10-30 NOTE — Assessment & Plan Note (Signed)
 Repeat A1c pending.  Continue glipizide  XL 5 mg daily, Jardiance  25 mg daily.

## 2023-10-30 NOTE — Assessment & Plan Note (Signed)
 Now following with ENT, office notes reviewed from June 2025.  Proceed with sleep study as recommended.

## 2023-10-30 NOTE — Patient Instructions (Signed)
 Start primidone  at bedtime for the tremors of your hands.  Take 1/2 tablet by mouth at bedtime for now.  You may proceed to a full tablet in 1 to 2 weeks if needed.  You will receive a phone call regarding the lung cancer screening program.  Stop taking lisinopril .  Please monitor your blood pressure at home.  Schedule a follow-up visit with cardiology.  Please schedule a follow up visit for 6 months for a diabetes check.  It was a pleasure to see you today!

## 2023-10-30 NOTE — Addendum Note (Signed)
 Addended by: Zaelyn Noack K on: 10/30/2023 10:47 AM   Modules accepted: Orders

## 2023-10-30 NOTE — Assessment & Plan Note (Signed)
 Noted today and during several prior visits.  Discontinue lisinopril  2.5 mg daily. He will start monitoring blood pressure and update if he continues to notice low readings and postural dizziness. Follow-up with cardiology.

## 2023-10-30 NOTE — Assessment & Plan Note (Signed)
 Repeat lipid panel pending. ? ?Continue rosuvastatin 20 mg daily. ?

## 2023-10-30 NOTE — Assessment & Plan Note (Signed)
 Following with urology, office notes and labs reviewed from May 2025.

## 2023-10-30 NOTE — Progress Notes (Signed)
 Subjective:    Patient ID: [name] Cook, male    DOB: [birth year], 73 y.o.   MRN: [id number]  HPI  [name] Cook is a very pleasant 73 y.o. male with a history of hypertension, acute MI, CAD, atrial fibrillation, pulmonary embolism, COPD, OSA, type 2 diabetes, neuropathy, CKD, prostate cancer who presents today for complete physical and follow up of chronic conditions.  He would also like to discuss essential tremor. Chronic for years. His tremors are located to bilateral hands at rest and with movement. His tremors are worse with fine motor movement such as working on his car. He can eat, dress himself, drive without difficulty.  He was once managed on propranolol  but this was ineffective and also caused diarrhea.  He denies lower extremity tremors, head tremors.  Immunizations:  -Shingles: Never completed, never had chicken pox -Pneumonia: Completed Prevnar 13 in 2019, pneumovax 23 in 2012  Diet: Fair diet.  Exercise: No regular exercise.  Eye exam: Completes annually  Dental exam: Completed years ago   Colonoscopy: Completed in 2023, due 2026 Lung Cancer Screening: Completed in 2024  PSA: UTD   BP Readings from Last 3 Encounters:  10/30/23 98/62  10/12/23 (!) 92/56  08/08/23 100/60       Review of Systems  Constitutional:  Negative for unexpected weight change.  HENT:  Negative for rhinorrhea.   Respiratory:  Negative for cough and shortness of breath.   Cardiovascular:  Negative for chest pain.  Gastrointestinal:  Negative for constipation and diarrhea.  Genitourinary:  Negative for difficulty urinating.  Musculoskeletal:  Negative for arthralgias and myalgias.  Skin:  Negative for rash.  Allergic/Immunologic: Negative for environmental allergies.  Neurological:  Positive for tremors and numbness. Negative for dizziness and headaches.  Psychiatric/Behavioral:  The patient is not nervous/anxious.          Past Medical History:  Diagnosis Date   Acute bronchitis  with COPD (HCC) 03/07/2021   Acute pulmonary embolism (HCC) 03/08/2022   Arthritis    knees and lower back (03/14/2013)   Atrial flutter (HCC)    radiofrequency ablation in 2001   CAD (coronary artery disease)    a. Nonobstructive. Cardiac cath in 2001-50% mid RI, normal LM, LAD, RCA b. cath 10/16/2014 95% mid RCA treated with DES, 99% ost D1 medical management due to small aneurysmal segment   Chronic anticoagulation    chronic Coumadin  anticoagulation   Chronic obstructive pulmonary disease (HCC) 04/20/2011   Diabetes mellitus, type 2 (HCC)    Diaphoresis 10/26/2021   Elevated lipase 06/23/2021   GERD (gastroesophageal reflux disease)    GIB (gastrointestinal bleeding) 03/08/2022   Hyperlipidemia    Hypertension    with hypertensive heart disease   Left knee pain 10/25/2017   medial   Obesity    Persistent atrial fibrillation (HCC)    recurrent atrial flutter since 2001 s/p DCCVs, multiple failed AADs, h/o tachy-mediated cardiomyopathy   Presence of Watchman left atrial appendage closure device 06/22/2022   Watchman  31mm FLX placed with Dr. Wonda   Pulmonary embolism (HCC) 03/09/2022   Shortness of breath    can come on at any time (03/14/2013)   Sleep apnea    dx'd; couldn't wear the mask (03/14/2013)   Symptomatic anemia 03/08/2022   Tobacco abuse    Upper GI bleed 12/20/2022   Urinary tract infection without hematuria 05/26/2022    Social History   Socioeconomic History   Marital status: Widowed    Spouse name: Not  on file   Number of children: 1   Years of education: Not on file   Highest education level: Not on file  Occupational History   Occupation: Unemployed    Employer: UNEMPLOYED  Tobacco Use   Smoking status: Former    Current packs/day: 0.00    Average packs/day: 1 pack/day for 42.0 years (42.0 ttl pk-yrs)    Types: Cigarettes    Start date: 12/31/1971    Quit date: 12/30/2013    Years since quitting: 9.8   Smokeless tobacco: Never   Tobacco  comments:    Former smoker 03/20/22  Vaping Use   Vaping status: Never Used  Substance and Sexual Activity   Alcohol use: Not Currently    Comment: 03/14/2013 stopped drinking back in 2002; never had problem w/it   Drug use: No   Sexual activity: Not Currently  Other Topics Concern   Not on file  Social History Narrative   Single.   Retired.    1 son, deceased.    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   Enjoys playing on the computer.       Social Drivers of Corporate investment banker Strain: Low Risk  (10/10/2022)   Overall Financial Resource Strain (CARDIA)    Difficulty of Paying Living Expenses: Not hard at all  Food Insecurity: No Food Insecurity (12/29/2022)   Hunger Vital Sign    Worried About Running Out of Food in the Last Year: Never true    Ran Out of Food in the Last Year: Never true  Transportation Needs: No Transportation Needs (12/29/2022)   PRAPARE - Administrator, Civil Service (Medical): No    Lack of Transportation (Non-Medical): No  Physical Activity: Insufficiently Active (10/10/2022)   Exercise Vital Sign    Days of Exercise per Week: 3 days    Minutes of Exercise per Session: 30 min  Stress: No Stress Concern Present (10/10/2022)   [employer] of Occupational Health - Occupational Stress Questionnaire    Feeling of Stress : Not at all  Social Connections: Socially Isolated (10/10/2022)   Social Connection and Isolation Panel    Frequency of Communication with Friends and Family: More than three times a week    Frequency of Social Gatherings with Friends and Family: Three times a week    Attends Religious Services: Never    Active Member of Clubs or Organizations: No    Attends Banker Meetings: Never    Marital Status: Widowed  Intimate Partner Violence: Not At Risk (12/21/2022)   Humiliation, Afraid, Rape, and Kick questionnaire    Fear of Current or Ex-Partner: No     Emotionally Abused: No    Physically Abused: No    Sexually Abused: No    Past Surgical History:  Procedure Laterality Date   ATRIAL FLUTTER ABLATION  2002   atrial flutter; subsequently developed atrial fibrillation   AV NODE ABLATION  01/24/2013   CARDIAC CATHETERIZATION  2002   CARDIAC CATHETERIZATION N/A 10/16/2014   Procedure: Left Heart Cath and Coronary Angiography;  Surgeon: Ozell Fell, MD;  Location: [facility] INVASIVE CV LAB;  Service: Cardiovascular;  Laterality: N/A;   CARDIOVERSION  05/31/2011   Procedure: CARDIOVERSION;  Surgeon: Lamar HERO. Juventino, MD;  Location: [facility] ORS;  Service: Cardiovascular;  Laterality: N/A;   CARPAL TUNNEL RELEASE Left 1980's   COLONOSCOPY WITH PROPOFOL  N/A 12/30/2018   Procedure: COLONOSCOPY WITH PROPOFOL ;  Surgeon: Therisa Bi, MD;  Location: [facility] ENDOSCOPY;  Service: Gastroenterology;  Laterality: N/A;   COLONOSCOPY WITH PROPOFOL  N/A 12/04/2019   Procedure: COLONOSCOPY WITH PROPOFOL ;  Surgeon: Therisa Bi, MD;  Location: [facility] ENDOSCOPY;  Service: Gastroenterology;  Laterality: N/A;   COLONOSCOPY WITH PROPOFOL  N/A 06/13/2021   Procedure: COLONOSCOPY WITH PROPOFOL ;  Surgeon: Therisa Bi, MD;  Location: [facility] ENDOSCOPY;  Service: Gastroenterology;  Laterality: N/A;   ESOPHAGOGASTRODUODENOSCOPY N/A 03/01/2022   Procedure: ESOPHAGOGASTRODUODENOSCOPY (EGD);  Surgeon: Rosalie Kitchens, MD;  Location: [facility];  Service: Gastroenterology;  Laterality: N/A;   ESOPHAGOGASTRODUODENOSCOPY (EGD) WITH PROPOFOL  N/A 12/21/2022   Procedure: ESOPHAGOGASTRODUODENOSCOPY (EGD) WITH PROPOFOL ;  Surgeon: Leigh Elspeth SQUIBB, MD;  Location: [facility] ENDOSCOPY;  Service: Gastroenterology;  Laterality: N/A;   ESOPHAGOGASTRODUODENOSCOPY (EGD) WITH PROPOFOL  N/A 12/23/2022   Procedure: ESOPHAGOGASTRODUODENOSCOPY (EGD) WITH PROPOFOL ;  Surgeon: Leigh Elspeth SQUIBB, MD;  Location: [facility] ENDOSCOPY;  Service: Gastroenterology;  Laterality: N/A;   ESOPHAGOGASTRODUODENOSCOPY (EGD) WITH PROPOFOL  N/A  03/14/2023   Procedure: ESOPHAGOGASTRODUODENOSCOPY (EGD) WITH PROPOFOL ;  Surgeon: Therisa Bi, MD;  Location: [facility] ENDOSCOPY;  Service: Gastroenterology;  Laterality: N/A;   GIVENS CAPSULE STUDY Left 03/10/2022   Procedure: GIVENS CAPSULE STUDY;  Surgeon: Burnette Fallow, MD;  Location: [facility] ENDOSCOPY;  Service: Gastroenterology;  Laterality: Left;   HEMOSTASIS CLIP PLACEMENT  12/23/2022   Procedure: HEMOSTASIS CLIP PLACEMENT;  Surgeon: Leigh Elspeth SQUIBB, MD;  Location: [facility] ENDOSCOPY;  Service: Gastroenterology;;   HEMOSTASIS CONTROL  12/21/2022   Procedure: HEMOSTASIS CONTROL;  Surgeon: Leigh Elspeth SQUIBB, MD;  Location: [facility] ENDOSCOPY;  Service: Gastroenterology;;   INSERT / REPLACE / REMOVE PACEMAKER  01/24/2013    Medtronic Adapta L dual-chamber pacemaker, serial number [id number]    LEFT ATRIAL APPENDAGE OCCLUSION N/A 06/22/2022   Procedure: LEFT ATRIAL APPENDAGE OCCLUSION;  Surgeon: Wonda Sharper, MD;  Location: [facility] INVASIVE CV LAB;  Service: Cardiovascular;  Laterality: N/A;   LEFT HEART CATH AND CORONARY ANGIOGRAPHY N/A 06/07/2018   Procedure: LEFT HEART CATH AND CORONARY ANGIOGRAPHY;  Surgeon: Wonda Sharper, MD;  Location: [facility] INVASIVE CV LAB;  Service: Cardiovascular;  Laterality: N/A;   LEFT HEART CATHETERIZATION WITH CORONARY ANGIOGRAM N/A 03/17/2013   Procedure: LEFT HEART CATHETERIZATION WITH CORONARY ANGIOGRAM;  Surgeon: Lonni JONETTA Cash, MD; LAD mild dz, D1 branch 100%, inferior branch 99%, CFX OK, RCA 50%, EF 65%     LOOP RECORDER IMPLANT  2002   PERMANENT PACEMAKER INSERTION N/A 01/24/2013   Procedure: PERMANENT PACEMAKER INSERTION;  Surgeon: Danelle LELON Birmingham, MD;  Location: [facility] CATH LAB;  Service: Cardiovascular;  Laterality: N/A;   TEE WITHOUT CARDIOVERSION N/A 06/22/2022   Procedure: TRANSESOPHAGEAL ECHOCARDIOGRAM;  Surgeon: Wonda Sharper, MD;  Location: [facility] INVASIVE CV LAB;  Service: Cardiovascular;  Laterality: N/A;   TIBIAL TUBERCLERPLASTY  ~ 2003    Family History   Problem Relation Age of Onset   Dementia Mother    Alzheimer's disease Mother    Osteoporosis Mother     Allergies  Allergen Reactions   Januvia  [Sitagliptin ] Other (See Comments)    Elevated lipase   Metformin  And Related Diarrhea    Current Outpatient Medications on File Prior to Visit  Medication Sig Dispense Refill   albuterol  (VENTOLIN  HFA) 108 (90 Base) MCG/ACT inhaler INHALE 2 PUFFS BY MOUTH EVERY 4 HOURS AS NEEDED FOR WHEEZE OR FOR SHORTNESS OF BREATH 18 each 0   Ascorbic Acid (VITAMIN C) 1000 MG tablet Take 2,000 mg by mouth daily.     aspirin  EC 81 MG tablet Take 1 tablet (81 mg total) by mouth daily. Swallow  whole.     blood glucose meter kit and supplies KIT Dispense based on patient and insurance preference. Use up to four times daily as directed. (FOR ICD-9 250.00, 250.01). 1 each 0   budesonide -formoterol  (SYMBICORT ) 160-4.5 MCG/ACT inhaler Inhale 2 puffs into the lungs 2 (two) times daily as needed.     clopidogrel  (PLAVIX ) 75 MG tablet Take 1 tablet (75 mg total) by mouth daily. 90 tablet 2   empagliflozin  (JARDIANCE ) 25 MG TABS tablet TAKE 1 TABLET (25 MG TOTAL) BY MOUTH DAILY BEFORE BREAKFAST. FOR DIABETES. 90 tablet 0   fluticasone  (FLONASE ) 50 MCG/ACT nasal spray PLACE 1 SPRAY INTO BOTH NOSTRILS DAILY AS NEEDED FOR ALLERGIES OR RHINITIS. 48 mL 0   gabapentin  (NEURONTIN ) 300 MG capsule TAKE 1 CAPSULE BY MOUTH EVERY MORNING AND 2 CAPSULES BY MOUTH AT NIGHT FOR NEUROPATHY 270 capsule 0   glipiZIDE  (GLUCOTROL  XL) 5 MG 24 hr tablet TAKE 1 TABLET (5 MG TOTAL) BY MOUTH DAILY WITH BREAKFAST. FOR DIABETES. 90 tablet 0   glucose blood (ACCU-CHEK GUIDE) test strip TEST FOUR TIMES A DAY 400 strip 0   isosorbide  mononitrate (IMDUR ) 60 MG 24 hr tablet Take 1 tablet (60 mg total) by mouth daily. 90 tablet 3   pantoprazole  (PROTONIX ) 40 MG tablet Take 1 tablet (40 mg total) by mouth daily. For ulcer 90 tablet 1   rosuvastatin  (CRESTOR ) 20 MG tablet TAKE 1 TABLET BY MOUTH EVERY DAY  IN THE EVENING FOR CHOLESTEROL 90 tablet 2   sertraline  (ZOLOFT ) 100 MG tablet Take 1.5 tablets (150 mg total) by mouth daily. for anxiety and depression. 135 tablet 0   tamsulosin  (FLOMAX ) 0.4 MG CAPS capsule Take 1 capsule (0.4 mg total) by mouth daily. 90 capsule 3   tiZANidine  (ZANAFLEX ) 4 MG tablet TAKE 1 TABLET BY MOUTH AT BEDTIME AS NEEDED FOR MUSCLE SPASMS. 30 tablet 1   traMADol  (ULTRAM ) 50 MG tablet Take 1 tablet (50 mg total) by mouth every 8 (eight) hours as needed for moderate pain (pain score 4-6). 20 tablet 0   TYLENOL  500 MG tablet Take 1,000 mg by mouth daily.     nitroGLYCERIN  (NITROSTAT ) 0.4 MG SL tablet Place 1 tablet (0.4 mg total) under the tongue every 5 (five) minutes as needed for chest pain. Do not exceed 3 tablets in 24 hours. (Patient not taking: Reported on 10/30/2023) 25 tablet 0   No current facility-administered medications on file prior to visit.    BP 98/62   Pulse 81   Temp (!) 97.1 F (36.2 C) (Temporal)   Ht 5' 11 (1.803 m)   Wt 206 lb (93.4 kg)   SpO2 98%   BMI 28.73 kg/m  Objective:   Physical Exam HENT:     Right Ear: Tympanic membrane and ear canal normal.     Left Ear: Tympanic membrane and ear canal normal.  Eyes:     Pupils: Pupils are equal, round, and reactive to light.  Cardiovascular:     Rate and Rhythm: Normal rate and regular rhythm.  Pulmonary:     Effort: Pulmonary effort is normal.     Breath sounds: Normal breath sounds.  Abdominal:     General: Bowel sounds are normal.     Palpations: Abdomen is soft.     Tenderness: There is no abdominal tenderness.  Musculoskeletal:        General: Normal range of motion.     Cervical back: Neck supple.  Skin:    General: Skin is warm  and dry.  Neurological:     Mental Status: He is alert and oriented to person, place, and time.     Cranial Nerves: No cranial nerve deficit.     Deep Tendon Reflexes:     Reflex Scores:      Patellar reflexes are 2+ on the right side and 2+ on the  left side.    Comments: Resting tremors noted to bilateral hands. Tremors did increase with fine motor movement.  Psychiatric:        Mood and Affect: Mood normal.           Assessment & Plan:  Encounter for annual general medical examination with abnormal findings in adult Assessment & Plan: Immunizations UTD. Colonoscopy UTD, due 2026 PSA UTD  Discussed the importance of a healthy diet and regular exercise in order for weight loss, and to reduce the risk of further co-morbidity.  Exam stable. Labs pending.  Follow up in 1 year for repeat physical.    Atrial fibrillation, unspecified type [facility]) Assessment & Plan: Rate and rhythm regular today.  Watchman device in place.  Continue aspirin  81 mg daily, clopidogrel  75 mg daily.   Coronary artery disease involving native coronary artery of native heart with angina pectoris [facility]) Assessment & Plan: Asymptomatic.  Repeat lipid panel pending today. Continue clopidogrel  75 mg daily, aspirin  81 mg daily.   Primary hypertension Assessment & Plan: Remains hypotensive and is experiencing orthostatic dizziness. Discontinue lisinopril  2.5 mg daily.  He will update.  I have asked that he follow-up with cardiology.   Pulmonary emphysema, unspecified emphysema type (HCC) Assessment & Plan: Stable.  Continue Symbicort  160-4.5 mcg, 2 puffs twice daily Continue albuterol  inhaler as needed.  He will contact the lung cancer screening program to set up this CT for this year.   OSA (obstructive sleep apnea) Assessment & Plan: Now following with ENT, office notes reviewed from June 2025.  Proceed with sleep study as recommended.   Gastroesophageal reflux disease, unspecified whether esophagitis present Assessment & Plan: Controlled. No concerns today.  Continue pantoprazole  40 mg daily.   Neuropathy due to secondary diabetes [facility]) Assessment & Plan: Stable.  Continue gabapentin  300 mg in a.m., 600 mg in  p.m.   Type 2 diabetes mellitus with hyperglycemia, without long-term current use of insulin  (HCC) Assessment & Plan: Repeat A1c pending.  Continue glipizide  XL 5 mg daily, Jardiance  25 mg daily.  Orders: -     Microalbumin / creatinine urine ratio -     Hemoglobin A1c  Anxiety and depression Assessment & Plan: Overall stable.  Unfortunately he was a no-show for his psychiatry appointment.  He will call to reschedule.  Continue Zoloft  150 mg daily for now.   History of prostate cancer Assessment & Plan: Following with urology, office notes and labs reviewed from May 2025.   Hyperlipidemia, unspecified hyperlipidemia type Assessment & Plan: Repeat lipid panel pending. Continue rosuvastatin  20 mg daily.  Orders: -     Lipid panel -     Comprehensive metabolic panel with GFR  Tremor of both hands Assessment & Plan: Propranolol  previously ineffective, also would avoid in the setting of hypotension.  Start primidone  25 mg at bedtime.  Titrate up to 50 mg at bedtime if needed. She will update.  Orders: -     Primidone ; Take 0.5 tablets (25 mg total) by mouth at bedtime. For tremors  Dispense: 45 tablet; Refill: 0  Hypotension due to drugs Assessment & Plan: Noted today and during several  prior visits.  Discontinue lisinopril  2.5 mg daily. He will start monitoring blood pressure and update if he continues to notice low readings and postural dizziness. Follow-up with cardiology.   History of tobacco use -     Ambulatory Referral for Lung Cancer Scre        Shalia Bartko K Keyly Baldonado, NP

## 2023-10-30 NOTE — Assessment & Plan Note (Signed)
 Rate and rhythm regular today.  Watchman device in place.  Continue aspirin  81 mg daily, clopidogrel  75 mg daily.

## 2023-10-30 NOTE — Assessment & Plan Note (Signed)
 Stable.  Continue gabapentin  300 mg in a.m., 600 mg in p.m.

## 2023-10-30 NOTE — Assessment & Plan Note (Signed)
 Remains hypotensive and is experiencing orthostatic dizziness. Discontinue lisinopril  2.5 mg daily.  He will update.  I have asked that he follow-up with cardiology.

## 2023-10-30 NOTE — Assessment & Plan Note (Addendum)
 Controlled. No concerns today.  Continue pantoprazole  40 mg daily.

## 2023-10-31 ENCOUNTER — Other Ambulatory Visit: Payer: Self-pay | Admitting: Family Medicine

## 2023-10-31 ENCOUNTER — Ambulatory Visit: Payer: Self-pay | Admitting: Internal Medicine

## 2023-10-31 LAB — MICROALBUMIN / CREATININE URINE RATIO
Creatinine,U: 57.1 mg/dL
Microalb Creat Ratio: 29.8 mg/g (ref 0.0–30.0)
Microalb, Ur: 1.7 mg/dL (ref 0.0–1.9)

## 2023-10-31 NOTE — Telephone Encounter (Signed)
 Last office visit 10/30/2023 for CPE with PCP.  Last refilled 10/03/23 for #30 with 1 refill by Dr. Watt.  Pharmacy is requesting 90 day supply.  Please refill if 90 day appropriate.

## 2023-11-01 NOTE — Telephone Encounter (Signed)
 Copied from CRM [id number]. Topic: Clinical - Lab/Test Results >> Nov 01, 2023  1:59 PM Robinson H wrote: Reason for CRM: Patient returned call to office regarding results, relayed patient message from provider as stated, patient acknowledged

## 2023-11-04 ENCOUNTER — Other Ambulatory Visit: Payer: Self-pay | Admitting: Primary Care

## 2023-11-04 DIAGNOSIS — J439 Emphysema, unspecified: Secondary | ICD-10-CM

## 2023-11-06 ENCOUNTER — Ambulatory Visit: Admitting: Psychiatry

## 2023-11-19 ENCOUNTER — Encounter (HOSPITAL_BASED_OUTPATIENT_CLINIC_OR_DEPARTMENT_OTHER): Payer: Self-pay

## 2023-11-20 ENCOUNTER — Ambulatory Visit (INDEPENDENT_AMBULATORY_CARE_PROVIDER_SITE_OTHER)

## 2023-11-20 VITALS — BP 98/62 | Ht 71.0 in | Wt 205.0 lb

## 2023-11-20 DIAGNOSIS — Z2821 Immunization not carried out because of patient refusal: Secondary | ICD-10-CM | POA: Diagnosis not present

## 2023-11-20 DIAGNOSIS — Z0000 Encounter for general adult medical examination without abnormal findings: Secondary | ICD-10-CM

## 2023-11-20 NOTE — Progress Notes (Signed)
 Because this visit was a virtual/telehealth visit,  certain criteria was not obtained, such a blood pressure, CBG if applicable, and timed get up and go. Any medications not marked as taking were not mentioned during the medication reconciliation part of the visit. Any vitals not documented were not able to be obtained due to this being a telehealth visit or patient was unable to self-report a recent blood pressure reading due to a lack of equipment at home via telehealth. Vitals that have been documented are verbally provided by the patient.  This visit was performed by a medical professional under my direct supervision. I was immediately available for consultation/collaboration. I have reviewed and agree with the Annual Wellness Visit documentation.  Subjective:   [name] Cook is a 73 y.o. who presents for a Medicare Wellness preventive visit.  As a reminder, Annual Wellness Visits don't include a physical exam, and some assessments may be limited, especially if this visit is performed virtually. We may recommend an in-person follow-up visit with your provider if needed.  Visit Complete: Virtual I connected with  [name] Cook on 11/20/23 by a audio enabled telemedicine application and verified that I am speaking with the correct person using two identifiers.  Patient Location: Home  Provider Location: Home Office  I discussed the limitations of evaluation and management by telemedicine. The patient expressed understanding and agreed to proceed.  Vital Signs: Because this visit was a virtual/telehealth visit, some criteria may be missing or patient reported. Any vitals not documented were not able to be obtained and vitals that have been documented are patient reported.  VideoDeclined- This patient declined Librarian, academic. Therefore the visit was completed with audio only.  Persons Participating in Visit: Patient.  AWV Questionnaire: No: Patient Medicare AWV  questionnaire was not completed prior to this visit.  Cardiac Risk Factors include: advanced age (>63men, >67 women);male gender;diabetes mellitus;hypertension;Other (see comment);dyslipidemia, Risk factor comments: afib     Objective:    Today's Vitals   11/20/23 1036  BP: 98/62  Weight: 205 lb (93 kg)  Height: 5' 11 (1.803 m)   Body mass index is 28.59 kg/m.     11/20/2023   10:40 AM 05/29/2023    3:30 PM 03/14/2023    9:03 AM 01/11/2023    7:50 PM 12/23/2022   12:43 PM 12/21/2022    1:27 AM 12/20/2022    8:57 PM  Advanced Directives  Does Patient Have a Medical Advance Directive? No No No No No  No  Would patient like information on creating a medical advance directive? No - Patient declined No - Patient declined   No - Patient declined No - Patient declined     Current Medications (verified) Outpatient Encounter Medications as of 11/20/2023  Medication Sig   albuterol  (VENTOLIN  HFA) 108 (90 Base) MCG/ACT inhaler INHALE 2 PUFFS BY MOUTH EVERY 4 HOURS AS NEEDED FOR WHEEZE OR FOR SHORTNESS OF BREATH   Ascorbic Acid (VITAMIN C) 1000 MG tablet Take 2,000 mg by mouth daily.   aspirin  EC 81 MG tablet Take 1 tablet (81 mg total) by mouth daily. Swallow whole.   blood glucose meter kit and supplies KIT Dispense based on patient and insurance preference. Use up to four times daily as directed. (FOR ICD-9 250.00, 250.01).   budesonide -formoterol  (SYMBICORT ) 160-4.5 MCG/ACT inhaler Inhale 2 puffs into the lungs 2 (two) times daily as needed.   clopidogrel  (PLAVIX ) 75 MG tablet Take 1 tablet (75 mg total) by mouth daily.  empagliflozin  (JARDIANCE ) 25 MG TABS tablet TAKE 1 TABLET (25 MG TOTAL) BY MOUTH DAILY BEFORE BREAKFAST. FOR DIABETES.   fluticasone  (FLONASE ) 50 MCG/ACT nasal spray PLACE 1 SPRAY INTO BOTH NOSTRILS DAILY AS NEEDED FOR ALLERGIES OR RHINITIS.   gabapentin  (NEURONTIN ) 300 MG capsule TAKE 1 CAPSULE BY MOUTH EVERY MORNING AND 2 CAPSULES BY MOUTH AT NIGHT FOR NEUROPATHY    glipiZIDE  (GLUCOTROL  XL) 5 MG 24 hr tablet TAKE 1 TABLET (5 MG TOTAL) BY MOUTH DAILY WITH BREAKFAST. FOR DIABETES.   glucose blood (ACCU-CHEK GUIDE) test strip TEST FOUR TIMES A DAY   isosorbide  mononitrate (IMDUR ) 60 MG 24 hr tablet Take 1 tablet (60 mg total) by mouth daily.   nitroGLYCERIN  (NITROSTAT ) 0.4 MG SL tablet Place 1 tablet (0.4 mg total) under the tongue every 5 (five) minutes as needed for chest pain. Do not exceed 3 tablets in 24 hours.   pantoprazole  (PROTONIX ) 40 MG tablet Take 1 tablet (40 mg total) by mouth daily. For ulcer   primidone  (MYSOLINE ) 50 MG tablet Take 0.5 tablets (25 mg total) by mouth at bedtime. For tremors   rosuvastatin  (CRESTOR ) 20 MG tablet TAKE 1 TABLET BY MOUTH EVERY DAY IN THE EVENING FOR CHOLESTEROL   sertraline  (ZOLOFT ) 100 MG tablet Take 1.5 tablets (150 mg total) by mouth daily. for anxiety and depression.   tamsulosin  (FLOMAX ) 0.4 MG CAPS capsule Take 1 capsule (0.4 mg total) by mouth daily.   tiZANidine  (ZANAFLEX ) 4 MG tablet TAKE 1 TABLET BY MOUTH AT BEDTIME AS NEEDED FOR MUSCLE SPASMS.   traMADol  (ULTRAM ) 50 MG tablet Take 1 tablet (50 mg total) by mouth every 8 (eight) hours as needed for moderate pain (pain score 4-6).   TYLENOL  500 MG tablet Take 1,000 mg by mouth daily.   No facility-administered encounter medications on file as of 11/20/2023.    Allergies (verified) Januvia  [sitagliptin ] and Metformin  and related   History: Past Medical History:  Diagnosis Date   Acute bronchitis with COPD (HCC) 03/07/2021   Acute pulmonary embolism (HCC) 03/08/2022   Arthritis    knees and lower back (03/14/2013)   Atrial flutter (HCC)    radiofrequency ablation in 2001   CAD (coronary artery disease)    a. Nonobstructive. Cardiac cath in 2001-50% mid RI, normal LM, LAD, RCA b. cath 10/16/2014 95% mid RCA treated with DES, 99% ost D1 medical management due to small aneurysmal segment   Chronic anticoagulation    chronic Coumadin  anticoagulation    Chronic obstructive pulmonary disease (HCC) 04/20/2011   Diabetes mellitus, type 2 (HCC)    Diaphoresis 10/26/2021   Elevated lipase 06/23/2021   GERD (gastroesophageal reflux disease)    GIB (gastrointestinal bleeding) 03/08/2022   Hyperlipidemia    Hypertension    with hypertensive heart disease   Left knee pain 10/25/2017   medial   Obesity    Persistent atrial fibrillation (HCC)    recurrent atrial flutter since 2001 s/p DCCVs, multiple failed AADs, h/o tachy-mediated cardiomyopathy   Presence of Watchman left atrial appendage closure device 06/22/2022   Watchman  31mm FLX placed with Dr. Wonda   Pulmonary embolism (HCC) 03/09/2022   Shortness of breath    can come on at any time (03/14/2013)   Sleep apnea    dx'd; couldn't wear the mask (03/14/2013)   Symptomatic anemia 03/08/2022   Tobacco abuse    Upper GI bleed 12/20/2022   Urinary tract infection without hematuria 05/26/2022   Past Surgical History:  Procedure Laterality Date  ATRIAL FLUTTER ABLATION  2002   atrial flutter; subsequently developed atrial fibrillation   AV NODE ABLATION  01/24/2013   CARDIAC CATHETERIZATION  2002   CARDIAC CATHETERIZATION N/A 10/16/2014   Procedure: Left Heart Cath and Coronary Angiography;  Surgeon: Ozell Fell, MD;  Location: [facility] INVASIVE CV LAB;  Service: Cardiovascular;  Laterality: N/A;   CARDIOVERSION  05/31/2011   Procedure: CARDIOVERSION;  Surgeon: Lamar HERO. Juventino, MD;  Location: [facility] ORS;  Service: Cardiovascular;  Laterality: N/A;   CARPAL TUNNEL RELEASE Left 1980's   COLONOSCOPY WITH PROPOFOL  N/A 12/30/2018   Procedure: COLONOSCOPY WITH PROPOFOL ;  Surgeon: Therisa Bi, MD;  Location: [facility] ENDOSCOPY;  Service: Gastroenterology;  Laterality: N/A;   COLONOSCOPY WITH PROPOFOL  N/A 12/04/2019   Procedure: COLONOSCOPY WITH PROPOFOL ;  Surgeon: Therisa Bi, MD;  Location: [facility] ENDOSCOPY;  Service: Gastroenterology;  Laterality: N/A;   COLONOSCOPY WITH PROPOFOL  N/A 06/13/2021    Procedure: COLONOSCOPY WITH PROPOFOL ;  Surgeon: Therisa Bi, MD;  Location: [facility] ENDOSCOPY;  Service: Gastroenterology;  Laterality: N/A;   ESOPHAGOGASTRODUODENOSCOPY N/A 03/01/2022   Procedure: ESOPHAGOGASTRODUODENOSCOPY (EGD);  Surgeon: Rosalie Kitchens, MD;  Location: [facility]COPY;  Service: Gastroenterology;  Laterality: N/A;   ESOPHAGOGASTRODUODENOSCOPY (EGD) WITH PROPOFOL  N/A 12/21/2022   Procedure: ESOPHAGOGASTRODUODENOSCOPY (EGD) WITH PROPOFOL ;  Surgeon: Leigh Elspeth SQUIBB, MD;  Location: [facility] ENDOSCOPY;  Service: Gastroenterology;  Laterality: N/A;   ESOPHAGOGASTRODUODENOSCOPY (EGD) WITH PROPOFOL  N/A 12/23/2022   Procedure: ESOPHAGOGASTRODUODENOSCOPY (EGD) WITH PROPOFOL ;  Surgeon: Leigh Elspeth SQUIBB, MD;  Location: [facility] ENDOSCOPY;  Service: Gastroenterology;  Laterality: N/A;   ESOPHAGOGASTRODUODENOSCOPY (EGD) WITH PROPOFOL  N/A 03/14/2023   Procedure: ESOPHAGOGASTRODUODENOSCOPY (EGD) WITH PROPOFOL ;  Surgeon: Therisa Bi, MD;  Location: [facility] ENDOSCOPY;  Service: Gastroenterology;  Laterality: N/A;   GIVENS CAPSULE STUDY Left 03/10/2022   Procedure: GIVENS CAPSULE STUDY;  Surgeon: Burnette Fallow, MD;  Location: [facility] ENDOSCOPY;  Service: Gastroenterology;  Laterality: Left;   HEMOSTASIS CLIP PLACEMENT  12/23/2022   Procedure: HEMOSTASIS CLIP PLACEMENT;  Surgeon: Leigh Elspeth SQUIBB, MD;  Location: [facility] ENDOSCOPY;  Service: Gastroenterology;;   HEMOSTASIS CONTROL  12/21/2022   Procedure: HEMOSTASIS CONTROL;  Surgeon: Leigh Elspeth SQUIBB, MD;  Location: [facility] ENDOSCOPY;  Service: Gastroenterology;;   INSERT / REPLACE / REMOVE PACEMAKER  01/24/2013    Medtronic Adapta L dual-chamber pacemaker, serial number NWE [id number]    LEFT ATRIAL APPENDAGE OCCLUSION N/A 06/22/2022   Procedure: LEFT ATRIAL APPENDAGE OCCLUSION;  Surgeon: Fell Ozell, MD;  Location: [facility] INVASIVE CV LAB;  Service: Cardiovascular;  Laterality: N/A;   LEFT HEART CATH AND CORONARY ANGIOGRAPHY N/A 06/07/2018   Procedure: LEFT HEART CATH AND  CORONARY ANGIOGRAPHY;  Surgeon: Fell Ozell, MD;  Location: [facility] INVASIVE CV LAB;  Service: Cardiovascular;  Laterality: N/A;   LEFT HEART CATHETERIZATION WITH CORONARY ANGIOGRAM N/A 03/17/2013   Procedure: LEFT HEART CATHETERIZATION WITH CORONARY ANGIOGRAM;  Surgeon: Lonni JONETTA Cash, MD; LAD mild dz, D1 branch 100%, inferior branch 99%, CFX OK, RCA 50%, EF 65%     LOOP RECORDER IMPLANT  2002   PERMANENT PACEMAKER INSERTION N/A 01/24/2013   Procedure: PERMANENT PACEMAKER INSERTION;  Surgeon: Danelle LELON Birmingham, MD;  Location: [facility] CATH LAB;  Service: Cardiovascular;  Laterality: N/A;   TEE WITHOUT CARDIOVERSION N/A 06/22/2022   Procedure: TRANSESOPHAGEAL ECHOCARDIOGRAM;  Surgeon: Fell Ozell, MD;  Location: [facility] INVASIVE CV LAB;  Service: Cardiovascular;  Laterality: N/A;   TIBIAL TUBERCLERPLASTY  ~ 2003   Family History  Problem Relation Age of Onset   Dementia Mother    Alzheimer's disease Mother  Osteoporosis Mother    Social History   Socioeconomic History   Marital status: Widowed    Spouse name: Not on file   Number of children: 1   Years of education: Not on file   Highest education level: Not on file  Occupational History   Occupation: Unemployed    Employer: UNEMPLOYED  Tobacco Use   Smoking status: Former    Current packs/day: 0.00    Average packs/day: 1 pack/day for 42.0 years (42.0 ttl pk-yrs)    Types: Cigarettes    Start date: 12/31/1971    Quit date: 12/30/2013    Years since quitting: 9.8   Smokeless tobacco: Never   Tobacco comments:    Former smoker 03/20/22  Vaping Use   Vaping status: Never Used  Substance and Sexual Activity   Alcohol use: Not Currently    Comment: 03/14/2013 stopped drinking back in 2002; never had problem w/it   Drug use: No   Sexual activity: Not Currently  Other Topics Concern   Not on file  Social History Narrative   Single.   Retired.    1 son, deceased.    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   Enjoys playing on the computer.       Social Drivers of Corporate investment banker Strain: Low Risk  (11/20/2023)   Overall Financial Resource Strain (CARDIA)    Difficulty of Paying Living Expenses: Not hard at all  Food Insecurity: No Food Insecurity (11/20/2023)   Hunger Vital Sign    Worried About Running Out of Food in the Last Year: Never true    Ran Out of Food in the Last Year: Never true  Transportation Needs: No Transportation Needs (11/20/2023)   PRAPARE - Administrator, Civil Service (Medical): No    Lack of Transportation (Non-Medical): No  Physical Activity: Insufficiently Active (11/20/2023)   Exercise Vital Sign    Days of Exercise per Week: 3 days    Minutes of Exercise per Session: 30 min  Stress: No Stress Concern Present (11/20/2023)   [employer] of Occupational Health - Occupational Stress Questionnaire    Feeling of Stress: Not at all  Social Connections: Socially Isolated (11/20/2023)   Social Connection and Isolation Panel    Frequency of Communication with Friends and Family: More than three times a week    Frequency of Social Gatherings with Friends and Family: Once a week    Attends Religious Services: Never    Database administrator or Organizations: No    Attends Banker Meetings: Never    Marital Status: Widowed    Tobacco Counseling Counseling given: Not Answered Tobacco comments: Former smoker 03/20/22    Clinical Intake:  Pre-visit preparation completed: Yes  Pain : No/denies pain     BMI - recorded: 28.59 Nutritional Status: BMI 25 -29 Overweight Nutritional Risks: None Diabetes: Yes CBG done?: No Did pt. bring in CBG monitor from home?: No  Lab Results  Component Value Date   HGBA1C 6.6 (H) 10/30/2023   HGBA1C 5.5 05/01/2023   HGBA1C 6.3 (A) 10/27/2022     How often do you need to have someone help you when you read instructions, pamphlets, or other written materials from  your doctor or pharmacy?: 1 - Never What is the last grade level you completed in school?: 9th grade  Interpreter Needed?: No  Information entered by :: [name] Cook,CMA   Activities of Daily Living  11/20/2023   10:39 AM 12/21/2022    1:00 AM  In your present state of health, do you have any difficulty performing the following activities:  Hearing? 0 0  Vision? 0 0  Difficulty concentrating or making decisions? 1 0  Comment some trouble remembering   Walking or climbing stairs? 0 0  Dressing or bathing? 0 0  Doing errands, shopping? 0 0  Preparing Food and eating ? N   Using the Toilet? N   In the past six months, have you accidently leaked urine? N   Do you have problems with loss of bowel control? Y   Managing your Medications? N   Managing your Finances? N   Housekeeping or managing your Housekeeping? N     Patient Care Team: Gretta Comer POUR, NP as PCP - General (Internal Medicine) Waddell Danelle ORN, MD as PCP - Electrophysiology (Cardiology) Jeffrie Oneil BROCKS, MD as PCP - Cardiology (Cardiology) Rothbart, Lamar HERO, MD (Cardiology) Lenn Aran, MD as Radiation Oncologist (Radiation Oncology) Fate Morna SAILOR, [facility] (Inactive) as Pharmacist (Pharmacist)  I have updated your Care Teams any recent Medical Services you may have received from other providers in the past year.     Assessment:   This is a routine wellness examination for [name] Cook.  Hearing/Vision screen Hearing Screening - Comments:: Patient has no difficulties  Vision Screening - Comments:: Patient wears Prescribed reading glasses    Goals Addressed             This Visit's Progress    DIET - EAT MORE FRUITS AND VEGETABLES   On track      Depression Screen     11/20/2023   10:41 AM 10/30/2023   10:56 AM 08/06/2023    1:27 PM 06/01/2023    2:58 PM 05/01/2023   10:02 AM 10/27/2022   10:14 AM 10/10/2022   11:23 AM  PHQ 2/9 Scores  PHQ - 2 Score 0 3  2 0 0 0  PHQ- 9 Score 2 5  5 3 3        Information is confidential and restricted. Go to Review Flowsheets to unlock data.    Fall Risk     11/20/2023   10:40 AM 10/30/2023   10:56 AM 08/08/2023    2:47 PM 06/01/2023    2:54 PM 05/01/2023   10:02 AM  Fall Risk   Falls in the past year? 0 0 1 0 0  Number falls in past yr: 0 0 0 0 0  Injury with Fall? 0 0 1 0 0  Risk for fall due to : No Fall Risks No Fall Risks History of fall(s) No Fall Risks No Fall Risks  Follow up Falls evaluation completed Falls evaluation completed Falls evaluation completed Falls evaluation completed Falls evaluation completed    MEDICARE RISK AT HOME:  Medicare Risk at Home Any stairs in or around the home?: Yes If so, are there any without handrails?: No Home free of loose throw rugs in walkways, pet beds, electrical cords, etc?: Yes Adequate lighting in your home to reduce risk of falls?: Yes Life alert?: No Use of a cane, walker or w/c?: No Grab bars in the bathroom?: No Shower chair or bench in shower?: Yes Elevated toilet seat or a handicapped toilet?: Yes  TIMED UP AND GO:  Was the test performed?  No  Cognitive Function: 6CIT completed    10/06/2020   12:16 PM 10/06/2019   12:07 PM 09/23/2018    9:29 PM 09/21/2017  1:08 PM  MMSE - Mini Mental State Exam  Not completed: Refused Refused    Orientation to time   5 5  Orientation to Place   5 5  Registration   3 3  Attention/ Calculation   0 0  Recall   0 1  Recall-comments   unable to recall 0 of 3 words unable to recall 2 of 3 words  Language- name 2 objects   0 0  Language- repeat   1 1  Language- follow 3 step command   0 3  Language- read & follow direction   0 0  Write a sentence   0 0  Copy design   0 0  Total score   14 18        11/20/2023   10:38 AM 10/10/2022   11:28 AM 10/07/2021   11:59 AM  6CIT Screen  What Year? 0 points 0 points 0 points  What month? 0 points 0 points 0 points  What time? 0 points 0 points 0 points  Count back from 20 0 points 0 points 0  points  Months in reverse 0 points 0 points 0 points  Repeat phrase 0 points 8 points 0 points  Total Score 0 points 8 points 0 points    Immunizations Immunization History  Administered Date(s) Administered   Fluad Quad(high Dose 65+) 02/02/2021, 12/23/2021   Fluad Trivalent(High Dose 65+) 12/25/2022   Influenza, High Dose Seasonal PF 02/20/2018, 02/03/2019   Influenza,inj,Quad PF,6+ Mos 02/05/2014, 01/04/2015, 04/20/2016   Influenza-Unspecified 01/05/2011, 01/15/2013, 01/30/2020   PFIZER(Purple Top)SARS-COV-2 Vaccination 09/17/2019, 10/08/2019   Pfizer(Comirnaty)Fall Seasonal Vaccine 12 years and older 02/01/2023   Pneumococcal Conjugate-13 09/21/2017   Pneumococcal Polysaccharide-23 01/05/2011   Tdap 01/05/2011    Screening Tests Health Maintenance  Topic Date Due   Pneumococcal Vaccine: 50+ Years (3 of 3 - PCV20 or PCV21) 09/22/2022   INFLUENZA VACCINE  11/16/2023   Lung Cancer Screening  01/12/2024   FOOT EXAM  04/30/2024   HEMOGLOBIN A1C  05/01/2024   Colonoscopy  06/13/2024   OPHTHALMOLOGY EXAM  08/08/2024   Diabetic kidney evaluation - eGFR measurement  10/29/2024   Diabetic kidney evaluation - Urine ACR  10/29/2024   Medicare Annual Wellness (AWV)  11/19/2024   Hepatitis C Screening  Completed   Hepatitis B Vaccines  Aged Out   HPV VACCINES  Aged Out   Meningococcal B Vaccine  Aged Out   DTaP/Tdap/Td  Discontinued   COVID-19 Vaccine  Discontinued   Zoster Vaccines- Shingrix  Discontinued    Health Maintenance  Health Maintenance Due  Topic Date Due   Pneumococcal Vaccine: 50+ Years (3 of 3 - PCV20 or PCV21) 09/22/2022   INFLUENZA VACCINE  11/16/2023   Health Maintenance Items Addressed:patient declined vaccinations  Additional Screening:  Vision Screening: Recommended annual ophthalmology exams for early detection of glaucoma and other disorders of the eye. Would you like a referral to an eye doctor? No    Dental Screening: Recommended annual dental  exams for proper oral hygiene  Community Resource Referral / Chronic Care Management: CRR required this visit?  No   CCM required this visit?  No   Plan:    I have personally reviewed and noted the following in the patient's chart:   Medical and social history Use of alcohol, tobacco or illicit drugs  Current medications and supplements including opioid prescriptions. Patient is not currently taking opioid prescriptions. Functional ability and status Nutritional status Physical activity Advanced directives List  of other physicians Hospitalizations, surgeries, and ER visits in previous 12 months Vitals Screenings to include cognitive, depression, and falls Referrals and appointments  In addition, I have reviewed and discussed with patient certain preventive protocols, quality metrics, and best practice recommendations. A written personalized care plan for preventive services as well as general preventive health recommendations were provided to patient.   Lyle MARLA Right, [place]   11/20/2023   After Visit Summary: (MyChart) Due to this being a telephonic visit, the after visit summary with patients personalized plan was offered to patient via MyChart   Notes: Nothing significant to report at this time.

## 2023-11-20 NOTE — Patient Instructions (Signed)
 [name] , Thank you for taking time out of your busy schedule to complete your Annual Wellness Visit with me. I enjoyed our conversation and look forward to speaking with you again next year. I, as well as your care team,  appreciate your ongoing commitment to your health goals. Please review the following plan we discussed and let me know if I can assist you in the future. Your Game plan/ To Do List    Referrals: If you haven't heard from the office you've been referred to, please reach out to them at the phone provided.   Follow up Visits: We will see or speak with you next year for your Next Medicare AWV with our clinical staff Have you seen your provider in the last 6 months (3 months if uncontrolled diabetes)? Yes  Clinician Recommendations:  Aim for 30 minutes of exercise or brisk walking, 6-8 glasses of water, and 5 servings of fruits and vegetables each day.       This is a list of the screenings recommended for you:  Health Maintenance  Topic Date Due   Pneumococcal Vaccine for age over 81 (3 of 3 - PCV20 or PCV21) 09/22/2022   Flu Shot  11/16/2023   Screening for Lung Cancer  01/12/2024   Complete foot exam   04/30/2024   Hemoglobin A1C  05/01/2024   Colon Cancer Screening  06/13/2024   Eye exam for diabetics  08/08/2024   Yearly kidney function blood test for diabetes  10/29/2024   Yearly kidney health urinalysis for diabetes  10/29/2024   Medicare Annual Wellness Visit  11/19/2024   Hepatitis C Screening  Completed   Hepatitis B Vaccine  Aged Out   HPV Vaccine  Aged Out   Meningitis B Vaccine  Aged Out   DTaP/Tdap/Td vaccine  Discontinued   COVID-19 Vaccine  Discontinued   Zoster (Shingles) Vaccine  Discontinued    Advanced directives: (Declined) Advance directive discussed with you today. Even though you declined this today, please call our office should you change your mind, and we can give you the proper paperwork for you to fill out. Advance Care Planning is  important because it:  [x]  Makes sure you receive the medical care that is consistent with your values, goals, and preferences  [x]  It provides guidance to your family and loved ones and reduces their decisional burden about whether or not they are making the right decisions based on your wishes.  Follow the link provided in your after visit summary or read over the paperwork we have mailed to you to help you started getting your Advance Directives in place. If you need assistance in completing these, please reach out to us  so that we can help you!  See attachments for Preventive Care and Fall Prevention Tips.

## 2023-11-21 ENCOUNTER — Other Ambulatory Visit: Payer: Self-pay | Admitting: Urology

## 2023-11-21 ENCOUNTER — Encounter: Payer: Self-pay | Admitting: Internal Medicine

## 2023-11-21 ENCOUNTER — Ambulatory Visit: Attending: Cardiology | Admitting: Internal Medicine

## 2023-11-21 VITALS — BP 144/70 | HR 88 | Ht 71.0 in | Wt 208.0 lb

## 2023-11-21 DIAGNOSIS — I442 Atrioventricular block, complete: Secondary | ICD-10-CM

## 2023-11-21 DIAGNOSIS — C61 Malignant neoplasm of prostate: Secondary | ICD-10-CM

## 2023-11-21 DIAGNOSIS — I4891 Unspecified atrial fibrillation: Secondary | ICD-10-CM

## 2023-11-21 LAB — CUP PACEART INCLINIC DEVICE CHECK
Battery Impedance: 2030 Ohm
Battery Remaining Longevity: 38 mo
Battery Voltage: 2.76 V
Brady Statistic RV Percent Paced: 100 %
Date Time Interrogation Session: 20250806170745
Implantable Lead Connection Status: 753985
Implantable Lead Connection Status: 753985
Implantable Lead Implant Date: 20141010
Implantable Lead Implant Date: 20141010
Implantable Lead Location: 753859
Implantable Lead Location: 753860
Implantable Lead Model: 5076
Implantable Lead Model: 5076
Implantable Pulse Generator Implant Date: 20141010
Lead Channel Impedance Value: 599 Ohm
Lead Channel Impedance Value: 67 Ohm
Lead Channel Pacing Threshold Amplitude: 0.75 V
Lead Channel Pacing Threshold Pulse Width: 0.4 ms
Lead Channel Setting Pacing Amplitude: 2.5 V
Lead Channel Setting Pacing Pulse Width: 0.4 ms
Lead Channel Setting Sensing Sensitivity: 4 mV
Zone Setting Status: 755011
Zone Setting Status: 755011

## 2023-11-21 MED ORDER — SILDENAFIL CITRATE 25 MG PO TABS: ORAL_TABLET | ORAL | 3 refills | Status: AC

## 2023-11-21 NOTE — Progress Notes (Signed)
 HPI Mr .Mckamie returns today for followup. He is a pleasant 73 yo man with a h/o Chronic atrial fib and CHB s/p PPM insertion. He has a h/o ED.  He denies chest pain or sob. No syncope.  Allergies  Allergen Reactions   Januvia  [Sitagliptin ] Other (See Comments)    Elevated lipase   Metformin  And Related Diarrhea     Current Outpatient Medications  Medication Sig Dispense Refill   albuterol  (VENTOLIN  HFA) 108 (90 Base) MCG/ACT inhaler INHALE 2 PUFFS BY MOUTH EVERY 4 HOURS AS NEEDED FOR WHEEZE OR FOR SHORTNESS OF BREATH 18 each 0   Ascorbic Acid (VITAMIN C) 1000 MG tablet Take 2,000 mg by mouth daily.     aspirin  EC 81 MG tablet Take 1 tablet (81 mg total) by mouth daily. Swallow whole.     blood glucose meter kit and supplies KIT Dispense based on patient and insurance preference. Use up to four times daily as directed. (FOR ICD-9 250.00, 250.01). 1 each 0   budesonide -formoterol  (SYMBICORT ) 160-4.5 MCG/ACT inhaler Inhale 2 puffs into the lungs 2 (two) times daily as needed.     clopidogrel  (PLAVIX ) 75 MG tablet Take 1 tablet (75 mg total) by mouth daily. 90 tablet 2   empagliflozin  (JARDIANCE ) 25 MG TABS tablet TAKE 1 TABLET (25 MG TOTAL) BY MOUTH DAILY BEFORE BREAKFAST. FOR DIABETES. 90 tablet 0   fluticasone  (FLONASE ) 50 MCG/ACT nasal spray PLACE 1 SPRAY INTO BOTH NOSTRILS DAILY AS NEEDED FOR ALLERGIES OR RHINITIS. 48 mL 0   gabapentin  (NEURONTIN ) 300 MG capsule TAKE 1 CAPSULE BY MOUTH EVERY MORNING AND 2 CAPSULES BY MOUTH AT NIGHT FOR NEUROPATHY 270 capsule 0   glipiZIDE  (GLUCOTROL  XL) 5 MG 24 hr tablet TAKE 1 TABLET (5 MG TOTAL) BY MOUTH DAILY WITH BREAKFAST. FOR DIABETES. 90 tablet 0   glucose blood (ACCU-CHEK GUIDE) test strip TEST FOUR TIMES A DAY 400 strip 0   isosorbide  mononitrate (IMDUR ) 60 MG 24 hr tablet Take 1 tablet (60 mg total) by mouth daily. 90 tablet 3   nitroGLYCERIN  (NITROSTAT ) 0.4 MG SL tablet Place 1 tablet (0.4 mg total) under the tongue every 5 (five)  minutes as needed for chest pain. Do not exceed 3 tablets in 24 hours. 25 tablet 0   pantoprazole  (PROTONIX ) 40 MG tablet Take 1 tablet (40 mg total) by mouth daily. For ulcer 90 tablet 1   primidone  (MYSOLINE ) 50 MG tablet Take 0.5 tablets (25 mg total) by mouth at bedtime. For tremors 45 tablet 0   rosuvastatin  (CRESTOR ) 20 MG tablet TAKE 1 TABLET BY MOUTH EVERY DAY IN THE EVENING FOR CHOLESTEROL 90 tablet 3   sertraline  (ZOLOFT ) 100 MG tablet Take 1.5 tablets (150 mg total) by mouth daily. for anxiety and depression. 135 tablet 0   tamsulosin  (FLOMAX ) 0.4 MG CAPS capsule TAKE 1 CAPSULE BY MOUTH EVERY DAY 90 capsule 3   tiZANidine  (ZANAFLEX ) 4 MG tablet TAKE 1 TABLET BY MOUTH AT BEDTIME AS NEEDED FOR MUSCLE SPASMS. 90 tablet 1   traMADol  (ULTRAM ) 50 MG tablet Take 1 tablet (50 mg total) by mouth every 8 (eight) hours as needed for moderate pain (pain score 4-6). 20 tablet 0   TYLENOL  500 MG tablet Take 1,000 mg by mouth daily.     No current facility-administered medications for this visit.     Past Medical History:  Diagnosis Date   Acute bronchitis with COPD (HCC) 03/07/2021   Acute pulmonary embolism (HCC) 03/08/2022  Arthritis    knees and lower back (03/14/2013)   Atrial flutter (HCC)    radiofrequency ablation in 2001   CAD (coronary artery disease)    a. Nonobstructive. Cardiac cath in 2001-50% mid RI, normal LM, LAD, RCA b. cath 10/16/2014 95% mid RCA treated with DES, 99% ost D1 medical management due to small aneurysmal segment   Chronic anticoagulation    chronic Coumadin  anticoagulation   Chronic obstructive pulmonary disease (HCC) 04/20/2011   Diabetes mellitus, type 2 (HCC)    Diaphoresis 10/26/2021   Elevated lipase 06/23/2021   GERD (gastroesophageal reflux disease)    GIB (gastrointestinal bleeding) 03/08/2022   Hyperlipidemia    Hypertension    with hypertensive heart disease   Left knee pain 10/25/2017   medial   Obesity    Persistent atrial fibrillation  (HCC)    recurrent atrial flutter since 2001 s/p DCCVs, multiple failed AADs, h/o tachy-mediated cardiomyopathy   Presence of Watchman left atrial appendage closure device 06/22/2022   Watchman  31mm FLX placed with Dr. Wonda   Pulmonary embolism (HCC) 03/09/2022   Shortness of breath    can come on at any time (03/14/2013)   Sleep apnea    dx'd; couldn't wear the mask (03/14/2013)   Symptomatic anemia 03/08/2022   Tobacco abuse    Upper GI bleed 12/20/2022   Urinary tract infection without hematuria 05/26/2022    ROS:   All systems reviewed and negative except as noted in the HPI.   Past Surgical History:  Procedure Laterality Date   ATRIAL FLUTTER ABLATION  2002   atrial flutter; subsequently developed atrial fibrillation   AV NODE ABLATION  01/24/2013   CARDIAC CATHETERIZATION  2002   CARDIAC CATHETERIZATION N/A 10/16/2014   Procedure: Left Heart Cath and Coronary Angiography;  Surgeon: Ozell Wonda, MD;  Location[facility] INVASIVE CV LAB;  Service: Cardiovascular;  Laterality: N/A;   CARDIOVERSION  05/31/2011   Procedure: CARDIOVERSION;  Surgeon: Lamar HERO. Juventino, MD;  Location: [facility];  Service: Cardiovascular;  Laterality: N/A;   CARPAL TUNNEL RELEASE Left 1980's   COLONOSCOPY WITH PROPOFOL  N/A 12/30/2018   Procedure: COLONOSCOPY WITH PROPOFOL ;  Surgeon: Therisa Bi, MD;  Location: [facility] ENDOSCOPY;  Service: Gastroenterology;  Laterality: N/A;   COLONOSCOPY WITH PROPOFOL  N/A 12/04/2019   Procedure: COLONOSCOPY WITH PROPOFOL ;  Surgeon: Therisa Bi, MD;  Location: [facility] ENDOSCOPY;  Service: Gastroenterology;  Laterality: N/A;   COLONOSCOPY WITH PROPOFOL  N/A 06/13/2021   Procedure: COLONOSCOPY WITH PROPOFOL ;  Surgeon: Therisa Bi, MD;  Location: [facility] ENDOSCOPY;  Service: Gastroenterology;  Laterality: N/A;   ESOPHAGOGASTRODUODENOSCOPY N/A 03/01/2022   Procedure: ESOPHAGOGASTRODUODENOSCOPY (EGD);  Surgeon: Rosalie Kitchens, MD;  Location: [facility];  Service: Gastroenterology;   Laterality: N/A;   ESOPHAGOGASTRODUODENOSCOPY (EGD) WITH PROPOFOL  N/A 12/21/2022   Procedure: ESOPHAGOGASTRODUODENOSCOPY (EGD) WITH PROPOFOL ;  Surgeon: Leigh Elspeth SQUIBB, MD;  Location: [facility] ENDOSCOPY;  Service: Gastroenterology;  Laterality: N/A;   ESOPHAGOGASTRODUODENOSCOPY (EGD) WITH PROPOFOL  N/A 12/23/2022   Procedure: ESOPHAGOGASTRODUODENOSCOPY (EGD) WITH PROPOFOL ;  Surgeon: Leigh Elspeth SQUIBB, MD;  Location: [facility] ENDOSCOPY;  Service: Gastroenterology;  Laterality: N/A;   ESOPHAGOGASTRODUODENOSCOPY (EGD) WITH PROPOFOL  N/A 03/14/2023   Procedure: ESOPHAGOGASTRODUODENOSCOPY (EGD) WITH PROPOFOL ;  Surgeon: Therisa Bi, MD;  [facility] END[facility]COPY;  Service: Gastroenterology;  Laterality: N/A;   GIVENS CAPSULE STUDY Left 03/10/2022   Procedure: GIVENS CAPSULE STUDY;  Surgeon: Burnette Fallow, MD;  Location: [facility] ENDOSCOPY;  Service: Gastroenterology;  Laterality: Left;   HEMOSTASIS CLIP PLACEMENT  12/23/2022   Procedure: HEMOSTASIS CLIP PLACEMENT;  Surgeon: Leigh Elspeth SQUIBB, MD;  Location: [facility] ENDOSCOPY;  Service: Gastroenterology;;   HEMOSTASIS CONTROL  12/21/2022   Procedure: HEMOSTASIS CONTROL;  Surgeon: Leigh Elspeth SQUIBB, MD;  Location: [facility] ENDOSCOPY;  Service: Gastroenterology;;   DONEEN / REPLACE / REMOVE PACEMAKER  01/24/2013    Medtronic Adapta L dual-chamber pacemaker, serial number [id number]    LEFT ATRIAL APPENDAGE OCCLUSION N/A 06/22/2022   Procedure: LEFT ATRIAL APPENDAGE OCCLUSION;  Surgeon: Wonda Sharper, MD;  Location: [facility] INVASIVE CV LAB;  Service: Cardiovascular;  Laterality: N/A;   LEFT HEART CATH AND CORONARY ANGIOGRAPHY N/A 06/07/2018   Procedure: LEFT HEART CATH AND CORONARY ANGIOGRAPHY;  Surgeon: Wonda Sharper, MD;  Location: [facility] INVASIVE CV LAB;  Service: Cardiovascular;  Laterality: N/A;   LEFT HEART CATHETERIZATION WITH CORONARY ANGIOGRAM N/A 03/17/2013   Procedure: LEFT HEART CATHETERIZATION WITH CORONARY ANGIOGRAM;  Surgeon: Lonni JONETTA Cash, MD; LAD mild dz, D1  branch 100%, inferior branch 99%, CFX OK, RCA 50%, EF 65%     LOOP RECORDER IMPLANT  2002   PERMANENT PACEMAKER INSERTION N/A 01/24/2013   Procedure: PERMANENT PACEMAKER INSERTION;  Surgeon: Danelle LELON Birmingham, MD;  Location: [facility] CATH LAB;  Service: Cardiovascular;  Laterality: N/A;   TEE WITHOUT CARDIOVERSION N/A 06/22/2022   Procedure: TRANSESOPHAGEAL ECHOCARDIOGRAM;  Surgeon: Wonda Sharper, MD;  Location: [facility] INVASIVE CV LAB;  Service: Cardiovascular;  Laterality: N/A;   TIBIAL TUBERCLERPLASTY  ~ 2003     Family History  Problem Relation Age of Onset   Dementia Mother    Alzheimer's disease Mother    Osteoporosis Mother      Social History   Socioeconomic History   Marital status: Widowed    Spouse name: Not on file   Number of children: 1   Years of education: Not on file   Highest education level: Not on file  Occupational History   Occupation: Unemployed    Employer: UNEMPLOYED  Tobacco Use   Smoking status: Former    Current packs/day: 0.00    Average packs/day: 1 pack/day for 42.0 years (42.0 ttl pk-yrs)    Types: Cigarettes    Start date: 12/31/1971    Quit date: 12/30/2013    Years since quitting: 9.8   Smokeless tobacco: Never   Tobacco comments:    Former smoker 03/20/22  Vaping Use   Vaping status: Never Used  Substance and Sexual Activity   Alcohol use: Not Currently    Comment: 03/14/2013 stopped drinking back in 2002; never had problem w/it   Drug use: No   Sexual activity: Not Currently  Other Topics Concern   Not on file  Social History Narrative   Single.   Retired.    1 son, deceased.    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   Enjoys playing on the computer.       Social Drivers of Corporate investment banker Strain: Low Risk  (11/20/2023)   Overall Financial Resource Strain (CARDIA)    Difficulty of Paying Living Expenses: Not hard at all  Food Insecurity: No Food Insecurity (11/20/2023)   Hunger  Vital Sign    Worried About Running Out of Food in the Last Year: Never true    Ran Out of Food in the Last Year: Never true  Transportation Needs: No Transportation Needs (11/20/2023)   PRAPARE - Administrator, Civil Service (Medical): No    Lack of Transportation (Non-Medical): No  Physical Activity: Insufficiently Active (11/20/2023)  Exercise Vital Sign    Days of Exercise per Week: 3 days    Minutes of Exercise per Session: 30 min  Stress: No Stress Concern Present (11/20/2023)   [employer] of Occupational Health - Occupational Stress Questionnaire    Feeling of Stress: Not at all  Social Connections: Socially Isolated (11/20/2023)   Social Connection and Isolation Panel    Frequency of Communication with Friends and Family: More than three times a week    Frequency of Social Gatherings with Friends and Family: Once a week    Attends Religious Services: Never    Database administrator or Organizations: No    Attends Banker Meetings: Never    Marital Status: Widowed  Intimate Partner Violence: Not At Risk (11/20/2023)   Humiliation, Afraid, Rape, and Kick questionnaire    Fear of Current or Ex-Partner: No    Emotionally Abused: No    Physically Abused: No    Sexually Abused: No     BP (!) 144/70   Pulse 88   Ht 5' 11 (1.803 m)   Wt 208 lb (94.3 kg)   SpO2 97%   BMI 29.01 kg/m   Physical Exam:  Well appearing NAD HEENT: Unremarkable Neck:  No JVD, no thyromegally Lymphatics:  No adenopathy Back:  No CVA tenderness Lungs:  Clear with no wheezes HEART:  Regular rate rhythm, no murmurs, no rubs, no clicks Abd:  soft, positive bowel sounds, no organomegally, no rebound, no guarding Ext:  2 plus pulses, no edema, no cyanosis, no clubbing Skin:  No rashes no nodules Neuro:  CN II through XII intact, motor grossly intact  EKG - afib with ventricular pacing  DEVICE  Normal device function.  See PaceArt for details.   Assess/Plan:  Atrial  fib - his VR is well controlled s/p AV node ablation.  CHB - he is asymptomatic s/p PPM insertion. HTN - his bp is controlled.  4. PPM - his Medtronic DDD PM is working normally. He is programmed VVIR. He will undergo biv upgrade at [facility] if his EF is down. He is about 3 years from ERI.   Danelle Seanna Sisler,MD

## 2023-11-21 NOTE — Patient Instructions (Addendum)
 Medication Instructions:  Your physician has recommended you make the following change in your medication:  Start Viagra  2-3 tablets as needed for ED.  Lab Work: None ordered.  You may go to any [facility] Location for your lab work:  [facility] - [address] ([facility]) - [address] [phone] [address]  [place] - [address]   [place]  - [address]   [place] - [address] - [address] [employer]  - [address] [place] - [address] ([facility]   If you have labs (blood work) drawn today and your tests are completely normal, you will receive your results only by: [employer] (if you have MyChart)  If you have any lab test that is abnormal or we need to change your treatment, we will call you or send a MyChart message to review the results.  Testing/Procedures: None ordered.  Follow-Up: At [facility], you and your health needs are our priority.  As part of our continuing mission to provide you with exceptional heart care, we have created designated Provider Care Teams.  These Care Teams include your primary Cardiologist (physician) and Advanced Practice Providers (APPs -  Physician Assistants and Nurse Practitioners) who all work together to provide you with the care you need, when you need it.  We recommend signing up for the patient portal called MyChart.  Sign up information is provided on this After Visit Summary.  MyChart is used to connect with patients for Virtual Visits (Telemedicine).  Patients are able to view lab/test results, encounter notes, upcoming appointments, etc.  Non-urgent messages can be sent to your provider as well.   To learn more about what you can do with MyChart, go to [email or web address].    Your next appointment:   1 year(s)  The format for your next appointment:   In Person  Provider:   Donnice Primus, MD or one of the following Advanced Practice Providers on your designated Care Team:   Charlies Arthur, [place] Ozell Jodie Passey, [place] Leotis Barrack, NP  Note: Remote monitoring is used to monitor your Pacemaker/ ICD from home. This monitoring reduces the number of office visits required to check your device to one time per year. It allows us  to keep an eye on the functioning of your device to ensure it is working properly.

## 2023-11-29 NOTE — Progress Notes (Deleted)
 BH MD/PA/NP OP Progress Note  11/29/2023 2:09 PM HOLTON SIDMAN  MRN:  [id number]  Chief Complaint: No chief complaint on file.  HPI: *** - he was started on primidone  for tremors.  - he is not seen since April   Vague ? 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 He moved to [place] with his parents in 68 with his family. He states that they (his parents) were good. He has been strange relationship with his sister.  He does not contact with his brother.  His mother passed away in January 09, 1988, and father in January 09, 2012.  Visit Diagnosis: No diagnosis found.  Past Psychiatric History: Please see initial evaluation for full details. I have reviewed the history. No updates at this time.     Past Medical History:  Past Medical History:  Diagnosis Date   Acute bronchitis with COPD (HCC) 03/07/2021   Acute pulmonary embolism (HCC) 03/08/2022   Arthritis    knees and lower back (03/14/2013)   Atrial flutter (HCC)    radiofrequency ablation in 01-09-2000   CAD (coronary artery disease)    a. Nonobstructive. Cardiac cath in 2001-50% mid RI, normal LM, LAD, RCA b. cath 10/16/2014 95% mid RCA treated with DES, 99% ost D1 medical management due to small aneurysmal segment   Chronic anticoagulation    chronic Coumadin  anticoagulation   Chronic obstructive pulmonary disease (HCC) 04/20/2011   Diabetes mellitus, type 2 (HCC)    Diaphoresis 10/26/2021   Elevated lipase 06/23/2021   GERD (gastroesophageal reflux disease)    GIB (gastrointestinal bleeding) 03/08/2022   Hyperlipidemia    Hypertension     with hypertensive heart disease   Left knee pain 10/25/2017   medial   Obesity    Persistent atrial fibrillation (HCC)    recurrent atrial flutter since 01-09-00 s/p DCCVs, multiple failed AADs, h/o tachy-mediated cardiomyopathy   Presence of Watchman left atrial appendage closure device 06/22/2022   Watchman  31mm FLX placed with Dr. Wonda   Pulmonary embolism (HCC) 03/09/2022   Shortness of breath    can come on at any time (03/14/2013)   Sleep apnea    dx'd; couldn't wear the mask (03/14/2013)   Symptomatic anemia 03/08/2022   Tobacco abuse    Upper GI bleed 12/20/2022   Urinary tract infection without hematuria 05/26/2022    Past Surgical History:  Procedure Laterality Date   ATRIAL FLUTTER ABLATION  01/08/01   atrial flutter; subsequently developed atrial fibrillation   AV NODE ABLATION  01/24/2013   CARDIAC CATHETERIZATION  January 08, 2001   CARDIAC CATHETERIZATION N/A 10/16/2014   Procedure: Left Heart Cath and Coronary Angiography;  Surgeon: Ozell Wonda, MD;  Location: [facility] INVASIVE CV LAB;  Service: Cardiovascular;  Laterality: N/A;   CARDIOVERSION  05/31/2011   Procedure: CARDIOVERSION;  Surgeon: Lamar HERO. Juventino, MD;  Location: [facility];  Service: Cardiovascular;  Laterality: N/A;   CARPAL TUNNEL RELEASE Left 1980's   COLONOSCOPY WITH PROPOFOL  N/A 12/30/2018   Procedure: COLONOSCOPY WITH PROPOFOL ;  Surgeon: Therisa Bi, MD;  Location: [facility] ENDOSCOPY;  Service: Gastroenterology;  Laterality: N/A;   COLONOSCOPY WITH PROPOFOL  N/A 12/04/2019   Procedure: COLONOSCOPY WITH PROPOFOL ;  Surgeon: Therisa Bi, MD;  Location: [facility] ENDOSCOPY;  Service: Gastroenterology;  Laterality: N/A;   COLONOSCOPY WITH PROPOFOL  N/A 06/13/2021   Procedure: COLONOSCOPY WITH PROPOFOL ;  Surgeon: Therisa Bi, MD;  Location: [facility] ENDOSCOPY;  Service: Gastroenterology;  Laterality: N/A;   ESOPHAGOGASTRODUODENOSCOPY N/A 03/01/2022   Procedure: ESOPHAGOGASTRODUODENOSCOPY (EGD);  Surgeon: Rosalie Kitchens, MD;  Location: [facility];  Service: Gastroenterology;  Laterality: N/A;   ESOPHAGOGASTRODUODENOSCOPY (EGD) WITH PROPOFOL  N/A 12/21/2022   Procedure: ESOPHAGOGASTRODUODENOSCOPY (EGD) WITH PROPOFOL ;  Surgeon: Leigh Elspeth SQUIBB, MD;  Location: [facility] ENDOSCOPY;  Service: Gastroenterology;  Laterality: N/A;   ESOPHAGOGASTRODUODENOSCOPY (EGD) WITH PROPOFOL  N/A 12/23/2022   Procedure: ESOPHAGOGASTRODUODENOSCOPY (EGD) WITH PROPOFOL ;  Surgeon: Leigh Elspeth SQUIBB, MD;  Location: [facility] ENDOSCOPY;  Service: Gastroenterology;  Laterality: N/A;   ESOPHAGOGASTRODUODENOSCOPY (EGD) WITH PROPOFOL  N/A 03/14/2023   Procedure: ESOPHAGOGASTRODUODENOSCOPY (EGD) WITH PROPOFOL ;  Surgeon: Therisa Bi, MD;  Location: [facility] ENDOSCOPY;  Service: Gastroenterology;  Laterality: N/A;   GIVENS CAPSULE STUDY Left 03/10/2022   Procedure: GIVENS CAPSULE STUDY;  Surgeon: Burnette Fallow, MD;  Location: [facility] ENDOSCOPY;  Service: Gastroenterology;  Laterality: Left;   HEMOSTASIS CLIP PLACEMENT  12/23/2022   Procedure: HEMOSTASIS CLIP PLACEMENT;  Surgeon: Leigh Elspeth SQUIBB, MD;  Location: [facility] ENDOSCOPY;  Service: Gastroenterology;;   HEMOSTASIS CONTROL  12/21/2022   Procedure: HEMOSTASIS CONTROL;  Surgeon: Leigh Elspeth SQUIBB, MD;  Location: [facility] ENDOSCOPY;  Service: Gastroenterology;;   INSERT / REPLACE / REMOVE PACEMAKER  01/24/2013    Medtronic Adapta L dual-chamber pacemaker, serial number NWE [id number]    LEFT ATRIAL APPENDAGE OCCLUSION N/A 06/22/2022   Procedure: LEFT ATRIAL APPENDAGE OCCLUSION;  Surgeon: Wonda Sharper, MD;  Location: [facility] INVASIVE CV LAB;  Service: Cardiovascular;  Laterality: N/A;   LEFT HEART CATH AND CORONARY ANGIOGRAPHY N/A 06/07/2018   Procedure: LEFT HEART CATH AND CORONARY ANGIOGRAPHY;  Surgeon: Wonda Sharper, MD;  Location: [facility] INVASIVE CV LAB;  Service: Cardiovascular;  Laterality: N/A;   LEFT HEART CATHETERIZATION WITH CORONARY ANGIOGRAM N/A 03/17/2013   Procedure: LEFT HEART CATHETERIZATION WITH CORONARY ANGIOGRAM;  Surgeon:  Lonni JONETTA Cash, MD; LAD mild dz, D1 branch 100%, inferior branch 99%, CFX OK, RCA 50%, EF 65%     LOOP RECORDER IMPLANT  2002   PERMANENT PACEMAKER INSERTION N/A 01/24/2013   Procedure: PERMANENT PACEMAKER INSERTION;  Surgeon: Danelle LELON Birmingham, MD;  Location: [facility] CATH LAB;  Service: Cardiovascular;  Laterality: N/A;   TEE WITHOUT CARDIOVERSION N/A 06/22/2022   Procedure: TRANSESOPHAGEAL ECHOCARDIOGRAM;  Surgeon: Wonda Sharper, MD;  Location: [facility] INVASIVE CV LAB;  Service: Cardiovascular;  Laterality: N/A;   TIBIAL TUBERCLERPLASTY  ~ 2003    Family Psychiatric History: Please see initial evaluation for full details. I have reviewed the history. No updates at this time.     Family History:  Family History  Problem Relation Age of Onset   Dementia Mother    Alzheimer's disease Mother    Osteoporosis Mother     Social History:  Social History   Socioeconomic History   Marital status: Widowed    Spouse name: Not on file   Number of children: 1   Years of education: Not on file   Highest education level: Not on file  Occupational History   Occupation: Unemployed    Employer: UNEMPLOYED  Tobacco Use   Smoking status: Former    Current packs/day: 0.00    Average packs/day:  1 pack/day for 42.0 years (42.0 ttl pk-yrs)    Types: Cigarettes    Start date: 12/31/1971    Quit date: 12/30/2013    Years since quitting: 9.9   Smokeless tobacco: Never   Tobacco comments:    Former smoker 03/20/22  Vaping Use   Vaping status: Never Used  Substance and Sexual Activity   Alcohol use: Not Currently    Comment: 03/14/2013 stopped drinking back in 2002; never had problem w/it   Drug use: No   Sexual activity: Not Currently  Other Topics Concern   Not on file  Social History Narrative   Single.   Retired.    1 son, deceased.    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   Enjoys playing on the computer.       Social Drivers of Manufacturing engineer Strain: Low Risk  (11/20/2023)   Overall Financial Resource Strain (CARDIA)    Difficulty of Paying Living Expenses: Not hard at all  Food Insecurity: No Food Insecurity (11/20/2023)   Hunger Vital Sign    Worried About Running Out of Food in the Last Year: Never true    Ran Out of Food in the Last Year: Never true  Transportation Needs: No Transportation Needs (11/20/2023)   PRAPARE - Administrator, Civil Service (Medical): No    Lack of Transportation (Non-Medical): No  Physical Activity: Insufficiently Active (11/20/2023)   Exercise Vital Sign    Days of Exercise per Week: 3 days    Minutes of Exercise per Session: 30 min  Stress: No Stress Concern Present (11/20/2023)   [employer] of Occupational Health - Occupational Stress Questionnaire    Feeling of Stress: Not at all  Social Connections: Socially Isolated (11/20/2023)   Social Connection and Isolation Panel    Frequency of Communication with Friends and Family: More than three times a week    Frequency of Social Gatherings with Friends and Family: Once a week    Attends Religious Services: Never    Database administrator or Organizations: No    Attends Banker Meetings: Never    Marital Status: Widowed    Allergies:  Allergies  Allergen Reactions   Januvia  [Sitagliptin ] Other (See Comments)    Elevated lipase   Metformin  And Related Diarrhea    Metabolic Disorder Labs: Lab Results  Component Value Date   HGBA1C 6.6 (H) 10/30/2023   MPG 137 03/08/2022   MPG 134 03/28/2017   No results found for: PROLACTIN Lab Results  Component Value Date   CHOL 136 10/30/2023   TRIG 81.0 10/30/2023   HDL 58.00 10/30/2023   CHOLHDL 2 10/30/2023   VLDL 16.2 10/30/2023   LDLCALC 61 10/30/2023   LDLCALC 63 10/27/2022   Lab Results  Component Value Date   TSH 1.920 08/06/2023   TSH 1.17 04/28/2022    Therapeutic Level Labs: No results found for: LITHIUM No results found  for: VALPROATE No results found for: CBMZ  Current Medications: Current Outpatient Medications  Medication Sig Dispense Refill   albuterol  (VENTOLIN  HFA) 108 (90 Base) MCG/ACT inhaler INHALE 2 PUFFS BY MOUTH EVERY 4 HOURS AS NEEDED FOR WHEEZE OR FOR SHORTNESS OF BREATH 18 each 0   Ascorbic Acid (VITAMIN C) 1000 MG tablet Take 2,000 mg by mouth daily.     aspirin  EC 81 MG tablet Take 1 tablet (81 mg total) by mouth daily. Swallow whole.  blood glucose meter kit and supplies KIT Dispense based on patient and insurance preference. Use up to four times daily as directed. (FOR ICD-9 250.00, 250.01). 1 each 0   budesonide -formoterol  (SYMBICORT ) 160-4.5 MCG/ACT inhaler Inhale 2 puffs into the lungs 2 (two) times daily as needed.     clopidogrel  (PLAVIX ) 75 MG tablet Take 1 tablet (75 mg total) by mouth daily. 90 tablet 2   empagliflozin  (JARDIANCE ) 25 MG TABS tablet TAKE 1 TABLET (25 MG TOTAL) BY MOUTH DAILY BEFORE BREAKFAST. FOR DIABETES. 90 tablet 0   fluticasone  (FLONASE ) 50 MCG/ACT nasal spray PLACE 1 SPRAY INTO BOTH NOSTRILS DAILY AS NEEDED FOR ALLERGIES OR RHINITIS. 48 mL 0   gabapentin  (NEURONTIN ) 300 MG capsule TAKE 1 CAPSULE BY MOUTH EVERY MORNING AND 2 CAPSULES BY MOUTH AT NIGHT FOR NEUROPATHY 270 capsule 0   glipiZIDE  (GLUCOTROL  XL) 5 MG 24 hr tablet TAKE 1 TABLET (5 MG TOTAL) BY MOUTH DAILY WITH BREAKFAST. FOR DIABETES. 90 tablet 0   glucose blood (ACCU-CHEK GUIDE) test strip TEST FOUR TIMES A DAY 400 strip 0   isosorbide  mononitrate (IMDUR ) 60 MG 24 hr tablet Take 1 tablet (60 mg total) by mouth daily. 90 tablet 3   nitroGLYCERIN  (NITROSTAT ) 0.4 MG SL tablet Place 1 tablet (0.4 mg total) under the tongue every 5 (five) minutes as needed for chest pain. Do not exceed 3 tablets in 24 hours. 25 tablet 0   pantoprazole  (PROTONIX ) 40 MG tablet Take 1 tablet (40 mg total) by mouth daily. For ulcer 90 tablet 1   primidone  (MYSOLINE ) 50 MG tablet Take 0.5 tablets (25 mg total) by mouth at  bedtime. For tremors 45 tablet 0   rosuvastatin  (CRESTOR ) 20 MG tablet TAKE 1 TABLET BY MOUTH EVERY DAY IN THE EVENING FOR CHOLESTEROL 90 tablet 3   sertraline  (ZOLOFT ) 100 MG tablet Take 1.5 tablets (150 mg total) by mouth daily. for anxiety and depression. 135 tablet 0   sildenafil  (VIAGRA ) 25 MG tablet Take 2 to 3 tablets as needed 28 tablet 3   tamsulosin  (FLOMAX ) 0.4 MG CAPS capsule TAKE 1 CAPSULE BY MOUTH EVERY DAY 90 capsule 3   tiZANidine  (ZANAFLEX ) 4 MG tablet TAKE 1 TABLET BY MOUTH AT BEDTIME AS NEEDED FOR MUSCLE SPASMS. 90 tablet 1   traMADol  (ULTRAM ) 50 MG tablet Take 1 tablet (50 mg total) by mouth every 8 (eight) hours as needed for moderate pain (pain score 4-6). 20 tablet 0   TYLENOL  500 MG tablet Take 1,000 mg by mouth daily.     No current facility-administered medications for this visit.     Musculoskeletal: Strength & Muscle Tone: within normal limits Gait & Station: normal Patient leans: N/A  Psychiatric Specialty Exam: Review of Systems  There were no vitals taken for this visit.There is no height or weight on file to calculate BMI.  General Appearance: {Appearance:22683}  Eye Contact:  {BHH EYE CONTACT:22684}  Speech:  Clear and Coherent  Volume:  Normal  Mood:  {BHH MOOD:22306}  Affect:  {Affect (PAA):22687}  Thought Process:  Coherent  Orientation:  Full (Time, Place, and Person)  Thought Content: Logical   Suicidal Thoughts:  {ST/HT (PAA):22692}  Homicidal Thoughts:  {ST/HT (PAA):22692}  Memory:  Immediate;   Good  Judgement:  {Judgement (PAA):22694}  Insight:  {Insight (PAA):22695}  Psychomotor Activity:  Normal  Concentration:  Concentration: Good and Attention Span: Good  Recall:  Good  Fund of Knowledge: Good  Language: Good  Akathisia:  No  Handed:  Right  AIMS (if indicated): not done  Assets:  Communication Skills Desire for Improvement  ADL's:  Intact  Cognition: WNL  Sleep:  {BHH GOOD/FAIR/POOR:22877}   Screenings: GAD-7     Flowsheet Row Office Visit from 10/30/2023 in [facility] [place] [facility] Visit from 08/06/2023 in [facility] Office Visit from 06/01/2023 in [facility] Office Visit from 10/27/2022 in [facility] Office Visit from 07/05/2022 in [facility] at [facility]  Total GAD-7 Score 8 12 0 2 3   Mini-Mental    Flowsheet Row Clinical Support from 09/23/2018 in [facility] [place] [facility] at [facility] Clinical Support from 09/21/2017 in [facility] [place] [facility]  Total Score (max 30 points ) 14 18   PHQ2-9    Flowsheet Row Clinical Support from 11/20/2023 in [facility] [place] [facility] Visit from 10/30/2023 in [facility] [place] [facility] [place] Office Visit from 08/06/2023 in [facility] Office Visit from 06/01/2023 in [facility] at [facility] Office Visit from 05/01/2023 in [facility] at [place]  PHQ-2 Total Score 0 3 3 2  0  PHQ-9 Total Score 2 5 7 5 3    Flowsheet Row Office Visit from 08/06/2023 in [facility] ED from 05/29/2023 in [facility] Admission (Discharged) from 03/14/2023 in [facility]  C-SSRS RISK CATEGORY No Risk No Risk No Risk     Assessment and Plan:  JAMORIAN DIMARIA is a 73 y.o. year old male with a history of depression, anxiety, CAD s/p PCI, A fib/flutter, complete heart block s/p pacemaker type II diabetes, sleep apnea, who is referred for depression, anxiety.    1. MDD (major depressive disorder), recurrent episode, mild (HCC) Reports ongoing stress related to his girlfriend, who has depression and medical issues. He experienced the loss of his son in his 76's, with whom he had  an estranged relationship, but denies any emotional struggle associated with it. He is also estranged from his sister and brother. History: no depression prior to 50's per patient, managed by his primary care   Exam is notable for restricted affect, and he reports depressive symptoms with anhedonia, which has worsened over the past year, although he cannot identify any triggers.  He reports strained relationship with his girlfriend, and estranged relationship with his siblings in [place].  He reports having had a good relationship with his parents growing up, though he does not provide further details.  Will uptitrate sertraline  to optimize treatment for depression while monitoring any bleeding tendency. Will also monitor any risk of qtc prolongation.    2. Cognitive impairment Functional Status   IADL: Independent in the following: driving           Requires assistance with the following: medications, managing finances  ADL  Independent in the following: bathing and hygiene, feeding, continence, grooming and toileting, walking          Requires assistance with the following: Folate, Vitamin B12 (03/2023), TSH Images Head CT 12/2022 (There is mild cerebral atrophy with widening of the extra-axial spaces and ventricular dilatation. There are areas of decreased attenuation within the white matter tracts of the supratentorial brain, consistent with microvascular disease changes.) Neuropsych assessment:  Etiology: r/o VaD   The exam is notable for  concrete thought process, occasional word-finding difficulty, memory loss as evidenced by his inability to recall the physical health issues affecting his girlfriend. He is cooperative during the exam otherwise.  He reportedly had leaning block, although he is not aware of having learning disability. It is unclear whether this represents his baseline functioning or is related to mood symptoms and/or cognitive impairment, such as dementia Will obtain labs to rule  out medical health issues contributing to his symptoms. Will plan to obtain [facility] at his next visit.    Plan Increase sertraline  150 mg at night  Obtain lab (TSH, fT4) Next appointment- 6/3 at 3 pm, IP     The patient demonstrates the following risk factors for suicide: Chronic risk factors for suicide include: psychiatric disorder of depression. Acute risk factors for suicide include: family or marital conflict and unemployment. Protective factors for this patient include: positive social support and hope for the future. Considering these factors, the overall suicide risk at this point appears to be low. Patient is appropriate for outpatient follow up.     Collaboration of Care: Collaboration of Care: {BH OP Collaboration of Care:21014065}  Patient/Guardian was advised Release of Information must be obtained prior to any record release in order to collaborate their care with an outside provider. Patient/Guardian was advised if they have not already done so to contact the registration department to sign all necessary forms in order for us  to release information regarding their care.   Consent: Patient/Guardian gives verbal consent for treatment and assignment of benefits for services provided during this visit. Patient/Guardian expressed understanding and agreed to proceed.    Katheren Sleet, MD 11/29/2023, 2:09 PM

## 2023-11-30 ENCOUNTER — Ambulatory Visit
Admission: EM | Admit: 2023-11-30 | Discharge: 2023-11-30 | Disposition: A | Discharge status: Home / Self Care | Attending: Emergency Medicine | Admitting: Emergency Medicine

## 2023-11-30 ENCOUNTER — Encounter: Payer: Self-pay | Admitting: Emergency Medicine

## 2023-11-30 ENCOUNTER — Ambulatory Visit: Payer: Self-pay

## 2023-11-30 DIAGNOSIS — M545 Low back pain, unspecified: Secondary | ICD-10-CM | POA: Diagnosis not present

## 2023-11-30 MED ORDER — HYDROCODONE-ACETAMINOPHEN 5-325 MG PO TABS
1.0000 | ORAL_TABLET | Freq: Four times a day (QID) | ORAL | 0 refills | Status: AC | PRN
Start: 2023-11-30 — End: 2023-12-03

## 2023-11-30 MED ORDER — PREDNISONE 10 MG (21) PO TBPK: ORAL_TABLET | Freq: Every day | ORAL | 0 refills | Status: DC

## 2023-11-30 MED ORDER — BACLOFEN 5 MG PO TABS: 5.0000 mg | ORAL_TABLET | Freq: Three times a day (TID) | ORAL | 0 refills | Status: AC

## 2023-11-30 NOTE — ED Provider Notes (Signed)
 CAY RALPH PELT    CSN: [id number] Arrival date & time: 11/30/23  1423      History   Chief Complaint No chief complaint on file.   HPI [name] Cook is a 73 y.o. male.   Patient presents for evaluation of left-sided low back pain beginning 7 days ago.  Has been constant fluctuating intensity exacerbated by changing position, standing and walking.  Pain does not radiate.  Denies numbness or tingling, urinary or bowel incontinence.  Endorses a history of chronic back pain.  Attempted use of tramadol  which has been ineffective.  Denies injury, change in activity.  Past Medical History:  Diagnosis Date   Acute bronchitis with COPD (HCC) 03/07/2021   Acute pulmonary embolism (HCC) 03/08/2022   Arthritis    knees and lower back (03/14/2013)   Atrial flutter (HCC)    radiofrequency ablation in 2001   CAD (coronary artery disease)    a. Nonobstructive. Cardiac cath in 2001-50% mid RI, normal LM, LAD, RCA b. cath 10/16/2014 95% mid RCA treated with DES, 99% ost D1 medical management due to small aneurysmal segment   Chronic anticoagulation    chronic Coumadin  anticoagulation   Chronic obstructive pulmonary disease (HCC) 04/20/2011   Diabetes mellitus, type 2 (HCC)    Diaphoresis 10/26/2021   Elevated lipase 06/23/2021   GERD (gastroesophageal reflux disease)    GIB (gastrointestinal bleeding) 03/08/2022   Hyperlipidemia    Hypertension    with hypertensive heart disease   Left knee pain 10/25/2017   medial   Obesity    Persistent atrial fibrillation (HCC)    recurrent atrial flutter since 2001 s/p DCCVs, multiple failed AADs, h/o tachy-mediated cardiomyopathy   Presence of Watchman left atrial appendage closure device 06/22/2022   Watchman  31mm FLX placed with Dr. Wonda   Pulmonary embolism (HCC) 03/09/2022   Shortness of breath    can come on at any time (03/14/2013)   Sleep apnea    dx'd; couldn't wear the mask (03/14/2013)   Symptomatic anemia 03/08/2022    Tobacco abuse    Upper GI bleed 12/20/2022   Urinary tract infection without hematuria 05/26/2022    Patient Active Problem List   Diagnosis Date Noted   Hypotension 10/30/2023   Anemia, posthemorrhagic, acute 12/21/2022   Duodenal ulcer 12/21/2022   Atrial fibrillation (HCC) 06/22/2022   Presence of Watchman left atrial appendage closure device 06/22/2022   Tremor of both hands 04/28/2022   Secondary hypercoagulable state (HCC) 03/20/2022   Class 1 obesity 03/09/2022   Neuropathy due to secondary diabetes (HCC) 07/28/2021   History of duodenal ulcer 02/06/2020   Antiplatelet or antithrombotic long-term use 10/09/2019   Polyp of colon    History of prostate cancer 06/10/2018   OSA (obstructive sleep apnea) 10/01/2017   Encounter for annual general medical examination with abnormal findings in adult 10/01/2017   Osteoarthritis 06/26/2017   Insomnia 10/01/2014   Anxiety and depression 03/09/2014   Coronary artery disease 11/06/2013   Long term current use of anticoagulant therapy 06/19/2013   Pacemaker 04/30/2013   Acute myocardial infarction, subendocardial infarction, initial episode of care (HCC) 03/15/2013   Chronic kidney disease, 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/26/2011   Chronic obstructive pulmonary disease (HCC) 04/20/2011   Hypertension    Hyperlipidemia    Type 2 diabetes mellitus with hyperglycemia (HCC)    Atrial flutter (HCC)    GASTROESOPHAGEAL REFLUX DISEASE 05/16/2010    Past Surgical History:  Procedure Laterality Date   ATRIAL FLUTTER ABLATION  2002   atrial flutter; subsequently developed atrial fibrillation   AV NODE ABLATION  01/24/2013   CARDIAC CATHETERIZATION  2002   CARDIAC CATHETERIZATION N/A 10/16/2014   Procedure: Left Heart Cath and Coronary Angiography;  Surgeon: Ozell Fell, MD;  Location: [facility] INVASIVE CV LAB;  Service: Cardiovascular;  Laterality: N/A;   CARDIOVERSION  05/31/2011   Procedure: CARDIOVERSION;  Surgeon: Lamar HERO. Juventino, MD;  Location: [facility]  ORS;  Service: Cardiovascular;  Laterality: N/A;   CARPAL TUNNEL RELEASE Left 1980's   COLONOSCOPY WITH PROPOFOL  N/A 12/30/2018   Procedure: COLONOSCOPY WITH PROPOFOL ;  Surgeon: Therisa Bi, MD;  Location: [facility] ENDOSCOPY;  Service: Gastroenterology;  Laterality: N/A;   COLONOSCOPY WITH PROPOFOL  N/A 12/04/2019   Procedure: COLONOSCOPY WITH PROPOFOL ;  Surgeon: Therisa Bi, MD;  Location: [facility] ENDOSCOPY;  Service: Gastroenterology;  Laterality: N/A;   COLONOSCOPY WITH PROPOFOL  N/A 06/13/2021   Procedure: COLONOSCOPY WITH PROPOFOL ;  Surgeon: Therisa Bi, MD;  Location: [facility] ENDOSCOPY;  Service: Gastroenterology;  Laterality: N/A;   ESOPHAGOGASTRODUODENOSCOPY N/A 03/01/2022   Procedure: ESOPHAGOGASTRODUODENOSCOPY (EGD);  Surgeon: Rosalie Kitchens, MD;  Location: [facility]COPY;  Service: Gastroenterology;  Laterality: N/A;   ESOPHAGOGASTRODUODENOSCOPY (EGD) WITH PROPOFOL  N/A 12/21/2022   Procedure: ESOPHAGOGASTRODUODENOSCOPY (EGD) WITH PROPOFOL ;  Surgeon: Leigh Elspeth SQUIBB, MD;  Location: [facility] ENDOSCOPY;  Service: Gastroenterology;  Laterality: N/A;   ESOPHAGOGASTRODUODENOSCOPY (EGD) WITH PROPOFOL  N/A 12/23/2022   Procedure: ESOPHAGOGASTRODUODENOSCOPY (EGD) WITH PROPOFOL ;  Surgeon: Leigh Elspeth SQUIBB, MD;  Location: [facility] ENDOSCOPY;  Service: Gastroenterology;  Laterality: N/A;   ESOPHAGOGASTRODUODENOSCOPY (EGD) WITH PROPOFOL  N/A 03/14/2023   Procedure: ESOPHAGOGASTRODUODENOSCOPY (EGD) WITH PROPOFOL ;  Surgeon: Therisa Bi, MD;  Location[facility] ENDOSCOPY;  Service: Gastroenterology;  Laterality: N/A;   GIVENS CAPSULE STUDY Left 03/10/2022   Procedure: GIVENS CAPSULE STUDY;  Surgeon: Burnette Fallow, MD;  Location: [facility] ENDOSCOPY;  Service: Gastroenterology;  Laterality: Left;   HEMOSTASIS CLIP PLACEMENT  12/23/2022   Procedure: HEMOSTASIS CLIP PLACEMENT;  Surgeon: Leigh Elspeth SQUIBB, MD;  Location: [facility] ENDOSCOPY;  Service: Gastroenterology;;   HEMOSTASIS CONTROL  12/21/2022   Procedure: HEMOSTASIS CONTROL;  Surgeon:  Leigh Elspeth SQUIBB, MD;  Location: [facility] ENDOSCOPY;  Service: Gastroenterology;;   INSERT / REPLACE / REMOVE PACEMAKER  01/24/2013    Medtronic Adapta L dual-chamber pacemaker, serial number [id number]    LEFT ATRIAL APPENDAGE OCCLUSION N/A 06/22/2022   Procedure: LEFT ATRIAL APPENDAGE OCCLUSION;  Surgeon: Fell Ozell, MD;  Location: [facility] INVASIVE CV LAB;  Service: Cardiovascular;  Laterality: N/A;   LEFT HEART CATH AND CORONARY ANGIOGRAPHY N/A 06/07/2018   Procedure: LEFT HEART CATH AND CORONARY ANGIOGRAPHY;  Surgeon: Fell Ozell, MD;  Location: [facility] INVASIVE CV LAB;  Service: Cardiovascular;  Laterality: N/A;   LEFT HEART CATHETERIZATION WITH CORONARY ANGIOGRAM N/A 03/17/2013   Procedure: LEFT HEART CATHETERIZATION WITH CORONARY ANGIOGRAM;  Surgeon: Lonni JONETTA Cash, MD; LAD mild dz, D1 branch 100%, inferior branch 99%, CFX OK, RCA 50%, EF 65%     LOOP RECORDER IMPLANT  2002   PERMANENT PACEMAKER INSERTION N/A 01/24/2013   Procedure: PERMANENT PACEMAKER INSERTION;  Surgeon: Danelle LELON Birmingham, MD;  Location: [facility] CATH LAB;  Service: Cardiovascular;  Laterality: N/A;   TEE WITHOUT CARDIOVERSION N/A 06/22/2022   Procedure: TRANSESOPHAGEAL ECHOCARDIOGRAM;  Surgeon: Fell Ozell, MD;  Location: [facility] INVASIVE CV LAB;  Service: Cardiovascular;  Laterality: N/A;   TIBIAL TUBERCLERPLASTY  ~ 2003       Home Medications    Prior to Admission medications   Medication Sig Start Date End Date Taking? Authorizing Provider  baclofen  5 MG TABS Take 1 tablet (5 mg total) by mouth 3 (three) times daily for 5 days. 11/30/23 12/05/23 Yes Myrissa Chipley, Shelba SAUNDERS, NP  HYDROcodone -acetaminophen  (NORCO/VICODIN) 5-325 MG tablet Take 1 tablet by mouth every 6 (six) hours as needed for up to 3 days. 11/30/23 12/03/23 Yes Chabeli Barsamian R, NP  predniSONE  (STERAPRED UNI-PAK 21 TAB) 10 MG (21) TBPK tablet Take by mouth daily. Take 6 tabs by mouth daily  for 1 days, then 5 tabs for 1 days, then 4 tabs for 1 days, then 3 tabs for  1 days, 2 tabs for 1 days, then 1 tab by mouth daily for 1 days 11/30/23  Yes Ginny Loomer R, NP  albuterol  (VENTOLIN  HFA) 108 (90 Base) MCG/ACT inhaler INHALE 2 PUFFS BY MOUTH EVERY 4 HOURS AS NEEDED FOR WHEEZE OR FOR SHORTNESS OF BREATH 10/10/23   Clark, Katherine K, NP  Ascorbic Acid (VITAMIN C) 1000 MG tablet Take 2,000 mg by mouth daily.    [provider]  aspirin  EC 81 MG tablet Take 1 tablet (81 mg total) by mouth daily. Swallow whole. 12/27/22   Cheryle Page, MD  blood glucose meter kit and supplies KIT Dispense based on patient and insurance preference. Use up to four times daily as directed. (FOR ICD-9 250.00, 250.01). 04/08/21   Clark, Katherine K, NP  budesonide -formoterol  (SYMBICORT ) 160-4.5 MCG/ACT inhaler Inhale 2 puffs into the lungs 2 (two) times daily as needed.    [provider]  clopidogrel  (PLAVIX ) 75 MG tablet Take 1 tablet (75 mg total) by mouth daily. 05/14/23   Jeffrie Oneil BROCKS, MD  empagliflozin  (JARDIANCE ) 25 MG TABS tablet TAKE 1 TABLET (25 MG TOTAL) BY MOUTH DAILY BEFORE BREAKFAST. FOR DIABETES. 10/28/23   Clark, Katherine K, NP  fluticasone  (FLONASE ) 50 MCG/ACT nasal spray PLACE 1 SPRAY INTO BOTH NOSTRILS DAILY AS NEEDED FOR ALLERGIES OR RHINITIS. 10/17/23   Clark, Katherine K, NP  gabapentin  (NEURONTIN ) 300 MG capsule TAKE 1 CAPSULE BY MOUTH EVERY MORNING AND 2 CAPSULES BY MOUTH AT NIGHT FOR NEUROPATHY 09/23/23   Clark, Katherine K, NP  glipiZIDE  (GLUCOTROL  XL) 5 MG 24 hr tablet TAKE 1 TABLET (5 MG TOTAL) BY MOUTH DAILY WITH BREAKFAST. FOR DIABETES. 10/17/23   Gretta Comer POUR, NP  glucose blood (ACCU-CHEK GUIDE) test strip TEST FOUR TIMES A DAY 09/04/22   Clark, Katherine K, NP  isosorbide  mononitrate (IMDUR ) 60 MG 24 hr tablet Take 1 tablet (60 mg total) by mouth daily. 12/20/22   Sebastian Lamarr SAUNDERS, PA-C  nitroGLYCERIN  (NITROSTAT ) 0.4 MG SL tablet Place 1 tablet (0.4 mg total) under the tongue every 5 (five) minutes as needed for chest pain. Do not exceed 3  tablets in 24 hours. 10/30/23   Clark, Katherine K, NP  pantoprazole  (PROTONIX ) 40 MG tablet Take 1 tablet (40 mg total) by mouth daily. For ulcer 05/14/23   Clark, Katherine K, NP  primidone  (MYSOLINE ) 50 MG tablet Take 0.5 tablets (25 mg total) by mouth at bedtime. For tremors 10/30/23   Clark, Katherine K, NP  rosuvastatin  (CRESTOR ) 20 MG tablet TAKE 1 TABLET BY MOUTH EVERY DAY IN THE EVENING FOR CHOLESTEROL 10/30/23   Clark, Katherine K, NP  sertraline  (ZOLOFT ) 100 MG tablet Take 1.5 tablets (150 mg total) by mouth daily. for anxiety and depression. 10/26/23   Clark, Katherine K, NP  sildenafil  (VIAGRA ) 25 MG tablet Take 2 to 3 tablets as needed 11/21/23   Waddell Danelle ORN, MD  tamsulosin  (FLOMAX ) 0.4 MG CAPS capsule  TAKE 1 CAPSULE BY MOUTH EVERY DAY 11/21/23   McGowan, Clotilda A, PA-C  tiZANidine  (ZANAFLEX ) 4 MG tablet TAKE 1 TABLET BY MOUTH AT BEDTIME AS NEEDED FOR MUSCLE SPASMS. 11/02/23   Copland, Jacques, MD  traMADol  (ULTRAM ) 50 MG tablet Take 1 tablet (50 mg total) by mouth every 8 (eight) hours as needed for moderate pain (pain score 4-6). 08/08/23   Copland, Jacques, MD  TYLENOL  500 MG tablet Take 1,000 mg by mouth daily.    [provider]    Family History Family History  Problem Relation Age of Onset   Dementia Mother    Alzheimer's disease Mother    Osteoporosis Mother     Social History Social History   Tobacco Use   Smoking status: Former    Current packs/day: 0.00    Average packs/day: 1 pack/day for 42.0 years (42.0 ttl pk-yrs)    Types: Cigarettes    Start date: 12/31/1971    Quit date: 12/30/2013    Years since quitting: 9.9   Smokeless tobacco: Never   Tobacco comments:    Former smoker 03/20/22  Vaping Use   Vaping status: Never Used  Substance Use Topics   Alcohol use: Not Currently    Comment: 03/14/2013 stopped drinking back in 2002; never had problem w/it   Drug use: No     Allergies   Januvia  [sitagliptin ] and Metformin  and related   Review of  Systems Review of Systems   Physical Exam Triage Vital Signs ED Triage Vitals  Encounter Vitals Group     BP 11/30/23 1428 100/67     Girls Systolic BP Percentile --      Girls Diastolic BP Percentile --      Boys Systolic BP Percentile --      Boys Diastolic BP Percentile --      Pulse Rate 11/30/23 1428 92     Resp 11/30/23 1428 18     Temp 11/30/23 1428 97.6 F (36.4 C)     Temp Source 11/30/23 1428 Oral     SpO2 11/30/23 1428 95 %     Weight --      Height --      Head Circumference --      Peak Flow --      Pain Score 11/30/23 1430 9     Pain Loc --      Pain Education --      Exclude from Growth Chart --    No data found.  Updated Vital Signs BP 100/67 (BP Location: Left Arm)   Pulse 92   Temp 97.6 F (36.4 C) (Oral)   Resp 18   SpO2 95%   Visual Acuity Right Eye Distance:   Left Eye Distance:   Bilateral Distance:    Right Eye Near:   Left Eye Near:    Bilateral Near:     Physical Exam Constitutional:      Appearance: Normal appearance.  Eyes:     Extraocular Movements: Extraocular movements intact.  Pulmonary:     Effort: Pulmonary effort is normal.  Musculoskeletal:     Comments: Tenderness to the lower latissimus dorsi, no spinal tenderness noted, unable to sit erect without palpable, pain elicited with movement  Neurological:     Mental Status: He is alert and oriented to person, place, and time. Mental status is at baseline.      UC Treatments / Results  Labs (all labs ordered are listed, but only abnormal results are displayed) Labs  Reviewed - No data to display  EKG   Radiology No results found.  Procedures Procedures (including critical care time)  Medications Ordered in UC Medications - No data to display  Initial Impression / Assessment and Plan / UC Course  I have reviewed the triage vital signs and the nursing notes.  Pertinent labs & imaging results that were available during my care of the patient were reviewed by  me and considered in my medical decision making (see chart for details).  Lumbar back pain  Denies injury, deferring imaging, discussed this with patient however x-ray warranted if symptoms continue to persist, prescribed prednisone , history of type 2 diabetes, controlled, cost effective on blood thinner and advised to monitor, has taken in the past with success, attempting a different muscle relaxant, prescribed baclofen  and additionally prescribed Vicodin, PDMP reviewed, low risk, recommended supportive care through [facility], heat massage stretching with activity as tolerated, walking referral to [facility]  Final Clinical Impressions(s) / UC Diagnoses   Final diagnoses:  Lumbar back pain     Discharge Instructions      Your pain is most likely caused by irritation to the muscles.  Begin prednisone  as directed to reduce inflammation and to help with pain, avoid use of ibuprofen and naproxen  during treatment  You may use muscle relaxant every 8 hours for additional comfort  You may use Vicodin pain medicine every 6 hours, be mindful this can make you feel drowsy  You may use heating pad in 15 minute intervals as needed for additional comfort  Begin stretching affected area daily for 10 minutes as tolerated to further loosen muscles   When lying down place pillow underneath and between knees for support  Can try sleeping without pillow on firm mattress   Practice good posture: head back, shoulders back, chest forward, pelvis back and weight distributed evenly on both legs  If pain persist after recommended treatment or reoccurs if may be beneficial to follow up with orthopedic specialist for evaluation, this doctor specializes in the bones and can manage your symptoms long-term with options such as but not limited to imaging, medications or physical therapy      ED Prescriptions     Medication Sig Dispense Auth. Provider   predniSONE  (STERAPRED UNI-PAK 21 TAB) 10 MG (21) TBPK  tablet Take by mouth daily. Take 6 tabs by mouth daily  for 1 days, then 5 tabs for 1 days, then 4 tabs for 1 days, then 3 tabs for 1 days, 2 tabs for 1 days, then 1 tab by mouth daily for 1 days 21 tablet Terique Kawabata R, NP   baclofen  5 MG TABS Take 1 tablet (5 mg total) by mouth 3 (three) times daily for 5 days. 15 tablet Deondria Puryear R, NP   HYDROcodone -acetaminophen  (NORCO/VICODIN) 5-325 MG tablet Take 1 tablet by mouth every 6 (six) hours as needed for up to 3 days. 12 tablet Bethel Sirois R, NP      I have reviewed the PDMP during this encounter.   Teresa Shelba SAUNDERS, NP 11/30/23 1454

## 2023-11-30 NOTE — Discharge Instructions (Signed)
 Your pain is most likely caused by irritation to the muscles.  Begin prednisone  as directed to reduce inflammation and to help with pain, avoid use of ibuprofen and naproxen  during treatment  You may use muscle relaxant every 8 hours for additional comfort  You may use Vicodin pain medicine every 6 hours, be mindful this can make you feel drowsy  You may use heating pad in 15 minute intervals as needed for additional comfort  Begin stretching affected area daily for 10 minutes as tolerated to further loosen muscles   When lying down place pillow underneath and between knees for support  Can try sleeping without pillow on firm mattress   Practice good posture: head back, shoulders back, chest forward, pelvis back and weight distributed evenly on both legs  If pain persist after recommended treatment or reoccurs if may be beneficial to follow up with orthopedic specialist for evaluation, this doctor specializes in the bones and can manage your symptoms long-term with options such as but not limited to imaging, medications or physical therapy

## 2023-11-30 NOTE — Telephone Encounter (Signed)
 FYI Only or Action Required?: FYI only for provider.  Patient was last seen in primary care on 10/30/2023 by Gretta Comer POUR, NP.  Called Nurse Triage reporting Back Pain.  Symptoms began a week ago.  Interventions attempted: OTC medications: naproxen .  Symptoms are: gradually worsening.  Triage Disposition: See HCP Within 4 Hours (Or PCP Triage)  Patient/caregiver understands and will follow disposition?: Yes     Copied from CRM #[id number]. Topic: Clinical - Red Word Triage >> Nov 30, 2023  1:43 PM Donna BRAVO wrote: Red Word that prompted transfer to Nurse Triage: patient called in asking for Dr Watt, patient has lower back pain that is getting worse, can't straighten up when walking, hard time getting out of chair, like a knife stabbing me in the back Reason for Disposition  [1] SEVERE back pain (e.g., excruciating, unable to do any normal activities) AND [2] not improved 2 hours after pain medicine  Answer Assessment - Initial Assessment Questions 1. ONSET: When did the pain begin? (e.g., minutes, hours, days)     X 1 week - worsening 2. LOCATION: Where does it hurt? (upper, mid or lower back)     lower 3. SEVERITY: How bad is the pain?  (e.g., Scale 1-10; mild, moderate, or severe)     10/10 4. PATTERN: Is the pain constant? (e.g., yes, no; constant, intermittent)      Constant with movement 5. RADIATION: Does the pain shoot into your legs or somewhere else?     denies 6. CAUSE:  What do you think is causing the back pain?      denies 7. BACK OVERUSE:  Any recent lifting of heavy objects, strenuous work or exercise?     Endorses some heavy lifting 8. MEDICINES: What have you taken so far for the pain? (e.g., nothing, acetaminophen , NSAIDS)     Naproxen  - just took a few minutes ago 9. NEUROLOGIC SYMPTOMS: Do you have any weakness, numbness, or problems with bowel/bladder control?     denies 10. OTHER SYMPTOMS: Do you have any other symptoms?  (e.g., fever, abdomen pain, burning with urination, blood in urine)       N/a 11. PREGNANCY: Is there any chance you are pregnant? When was your last menstrual period?       N/a    Triager attempted to schedule with PCP,but no access. Triager advised [facility]-- pt stated that he will go as a walk-in since unable to secure online appt.  Protocols used: Back Pain-A-AH

## 2023-11-30 NOTE — Telephone Encounter (Signed)
 Noted.  Can we check on patient on Monday?

## 2023-11-30 NOTE — ED Triage Notes (Signed)
 Patient complains of lower back pain x 1 week . Patient denies injury. Patient took Naproxen  about 1:30 pm with no relief. Rates pain 9/10.

## 2023-11-30 NOTE — Telephone Encounter (Signed)
 Left message to call office. Dr Watt is not in the office on Fridays. He may need to go to [facility] or ER for proper care.

## 2023-12-02 ENCOUNTER — Other Ambulatory Visit: Payer: Self-pay | Admitting: Physician Assistant

## 2023-12-03 NOTE — Telephone Encounter (Signed)
 Unable to reach patient. Left voicemail to return call to our office.

## 2023-12-04 NOTE — Telephone Encounter (Signed)
 Unable to reach patient. Left voicemail to return call to our office.

## 2023-12-05 ENCOUNTER — Telehealth: Payer: Self-pay | Admitting: Primary Care

## 2023-12-05 NOTE — Telephone Encounter (Signed)
 Unable to reach patient. Left voicemail to return call to our office.  3rd attempt Per chart review patient went to UC on 11/30/23

## 2023-12-05 NOTE — Telephone Encounter (Signed)
 Copied from CRM #[id number]. Topic: General - Call Back - No Documentation >> Dec 04, 2023  4:34 PM Rea C wrote: Reason for CRM: Patient returned call to speak with [facility].   [phone] (M)

## 2023-12-06 ENCOUNTER — Ambulatory Visit: Admitting: Psychiatry

## 2023-12-06 NOTE — Telephone Encounter (Signed)
 See other encounter

## 2023-12-06 NOTE — Telephone Encounter (Signed)
 Copied from CRM #[id number]. Topic: General - Call Back - No Documentation >> Dec 04, 2023  4:34 PM Rea C wrote: Reason for CRM: Patient returned call to speak with [facility].   [phone] (M) >> Dec 06, 2023  8:25 AM Rosina BIRCH wrote: Patient returning [facility] call CB [phone]

## 2023-12-06 NOTE — Telephone Encounter (Signed)
 Spoke with patient, states he was able to be seen at [facility] and his symptoms have resolved now. Will update if symptoms return.

## 2023-12-10 ENCOUNTER — Ambulatory Visit: Payer: Self-pay

## 2023-12-10 NOTE — Telephone Encounter (Signed)
 FYI Only or Action Required?: FYI only for provider.  Patient was last seen in primary care on 10/30/2023 by Gretta Comer POUR, NP.  Called Nurse Triage reporting Back Pain.  Symptoms began a couple of weeks ago.  Interventions attempted: Other: Seen at urgent care and finished a course of Prednisone .  Symptoms are: gradually worsening.  Triage Disposition: See PCP When Office is Open (Within 3 Days)  Patient/caregiver understands and will follow disposition?: Yes         Copied from CRM #[id number]. Topic: Clinical - Red Word Triage >> Dec 10, 2023  4:58 PM [place] J wrote: [facility] that prompted transfer to Nurse Triage: Patient's middle to lower back is hurting. He would like an X-Ray if possible. Patient prefers to be seen by the sports medicine provider (Dr Watt).            Reason for Disposition  [1] MODERATE back pain (e.g., interferes with normal activities) AND [2] present > 3 days  Answer Assessment - Initial Assessment Questions 1. ONSET: When did the pain begin? (e.g., minutes, hours, days)     A couple of weeks  2. LOCATION: Where does it hurt? (upper, mid or lower back)     Lower back  3. SEVERITY: How bad is the pain?  (e.g., Scale 1-10; mild, moderate, or severe)     Moderate to severe  4. PATTERN: Is the pain constant? (e.g., yes, no; constant, intermittent)      Constant  5. RADIATION: Does the pain shoot into your legs or somewhere else?     No radiation  6. CAUSE:  What do you think is causing the back pain?      Unsure  7. BACK OVERUSE:  Any recent lifting of heavy objects, strenuous work or exercise?     No 8. MEDICINES: What have you taken so far for the pain? (e.g., nothing, acetaminophen , NSAIDS)     Was on prednisone  as prescribed by urgent care  9. NEUROLOGIC SYMPTOMS: Do you have any weakness, numbness, or problems with bowel/bladder control?     No 10. OTHER SYMPTOMS: Do you have any other symptoms? (e.g.,  fever, abdomen pain, burning with urination, blood in urine)       No  Protocols used: Back Pain-A-AH

## 2023-12-11 NOTE — Progress Notes (Signed)
 [name] Quirk T. Danaly Bari, MD, CAQ Sports Medicine [facility] [address] [place], [address]  Phone: [phone]  FAX: [phone]  [name] Cook - 73 y.o. male  MRN [id number]  Date of Birth: 1950/08/08  Date: 12/12/2023  PCP: Gretta Comer POUR, NP  Referral: Gretta Comer POUR, NP  No chief complaint on file.  Subjective:   [name] Cook is a 73 y.o. very pleasant male patient with There is no height or weight on file to calculate BMI. who presents with the following:  Discussed the use of AI scribe software for clinical note transcription with the patient, who gave verbal consent to proceed.  [name] Cook is here for ongoing acute back pain.  I saw him in April 2025 with some ongoing back pain and left-sided sciatica.  He did go to urgent care on November 30, 2023, and at that point they gave him some prednisone , baclofen , and Vicodin. History of Present Illness     Review of Systems is noted in the HPI, as appropriate  Objective:   There were no vitals taken for this visit.  GEN: No acute distress; alert,appropriate. PULM: Breathing comfortably in no respiratory distress PSYCH: Normally interactive.    Laboratory and Imaging Data:  Assessment and Plan:   No diagnosis found. Assessment & Plan   Medication Management during today's office visit: No orders of the defined types were placed in this encounter.  There are no discontinued medications.  Orders placed today for conditions managed today: No orders of the defined types were placed in this encounter.   Disposition: No follow-ups on file.  Dr[employer] One speech-to-text software was used for transcription in this dictation.  Possible transcriptional errors can occur using Animal nutritionist.   Signed,  Jacques DASEN. Daily Doe, MD   Outpatient Encounter Medications as of 12/12/2023  Medication Sig   albuterol  (VENTOLIN  HFA) 108 (90 Base) MCG/ACT inhaler INHALE 2 PUFFS BY  MOUTH EVERY 4 HOURS AS NEEDED FOR WHEEZE OR FOR SHORTNESS OF BREATH   Ascorbic Acid (VITAMIN C) 1000 MG tablet Take 2,000 mg by mouth daily.   aspirin  EC 81 MG tablet Take 1 tablet (81 mg total) by mouth daily. Swallow whole.   blood glucose meter kit and supplies KIT Dispense based on patient and insurance preference. Use up to four times daily as directed. (FOR ICD-9 250.00, 250.01).   budesonide -formoterol  (SYMBICORT ) 160-4.5 MCG/ACT inhaler Inhale 2 puffs into the lungs 2 (two) times daily as needed.   clopidogrel  (PLAVIX ) 75 MG tablet Take 1 tablet (75 mg total) by mouth daily.   empagliflozin  (JARDIANCE ) 25 MG TABS tablet TAKE 1 TABLET (25 MG TOTAL) BY MOUTH DAILY BEFORE BREAKFAST. FOR DIABETES.   fluticasone  (FLONASE ) 50 MCG/ACT nasal spray PLACE 1 SPRAY INTO BOTH NOSTRILS DAILY AS NEEDED FOR ALLERGIES OR RHINITIS.   gabapentin  (NEURONTIN ) 300 MG capsule TAKE 1 CAPSULE BY MOUTH EVERY MORNING AND 2 CAPSULES BY MOUTH AT NIGHT FOR NEUROPATHY   glipiZIDE  (GLUCOTROL  XL) 5 MG 24 hr tablet TAKE 1 TABLET (5 MG TOTAL) BY MOUTH DAILY WITH BREAKFAST. FOR DIABETES.   glucose blood (ACCU-CHEK GUIDE) test strip TEST FOUR TIMES A DAY   isosorbide  mononitrate (IMDUR ) 60 MG 24 hr tablet TAKE 1 TABLET BY MOUTH EVERY DAY   nitroGLYCERIN  (NITROSTAT ) 0.4 MG SL tablet Place 1 tablet (0.4 mg total) under the tongue every 5 (five) minutes as needed for chest pain. Do not exceed 3 tablets in 24 hours.  pantoprazole  (PROTONIX ) 40 MG tablet Take 1 tablet (40 mg total) by mouth daily. For ulcer   predniSONE  (STERAPRED UNI-PAK 21 TAB) 10 MG (21) TBPK tablet Take by mouth daily. Take 6 tabs by mouth daily  for 1 days, then 5 tabs for 1 days, then 4 tabs for 1 days, then 3 tabs for 1 days, 2 tabs for 1 days, then 1 tab by mouth daily for 1 days   primidone  (MYSOLINE ) 50 MG tablet Take 0.5 tablets (25 mg total) by mouth at bedtime. For tremors   rosuvastatin  (CRESTOR ) 20 MG tablet TAKE 1 TABLET BY MOUTH EVERY DAY IN THE  EVENING FOR CHOLESTEROL   sertraline  (ZOLOFT ) 100 MG tablet Take 1.5 tablets (150 mg total) by mouth daily. for anxiety and depression.   sildenafil  (VIAGRA ) 25 MG tablet Take 2 to 3 tablets as needed   tamsulosin  (FLOMAX ) 0.4 MG CAPS capsule TAKE 1 CAPSULE BY MOUTH EVERY DAY   tiZANidine  (ZANAFLEX ) 4 MG tablet TAKE 1 TABLET BY MOUTH AT BEDTIME AS NEEDED FOR MUSCLE SPASMS.   traMADol  (ULTRAM ) 50 MG tablet Take 1 tablet (50 mg total) by mouth every 8 (eight) hours as needed for moderate pain (pain score 4-6).   TYLENOL  500 MG tablet Take 1,000 mg by mouth daily.   No facility-administered encounter medications on file as of 12/12/2023.

## 2023-12-12 ENCOUNTER — Ambulatory Visit
Admission: RE | Admit: 2023-12-12 | Discharge: 2023-12-12 | Disposition: A | Discharge status: Home / Self Care | Source: Ambulatory Visit | Attending: Family Medicine | Admitting: Family Medicine

## 2023-12-12 ENCOUNTER — Encounter: Payer: Self-pay | Admitting: Family Medicine

## 2023-12-12 ENCOUNTER — Ambulatory Visit (INDEPENDENT_AMBULATORY_CARE_PROVIDER_SITE_OTHER): Admitting: Family Medicine

## 2023-12-12 VITALS — BP 90/60 | HR 79 | Temp 97.3°F | Ht 71.0 in | Wt 204.5 lb

## 2023-12-12 DIAGNOSIS — M5136 Other intervertebral disc degeneration, lumbar region with discogenic back pain only: Secondary | ICD-10-CM | POA: Diagnosis not present

## 2023-12-12 DIAGNOSIS — M545 Low back pain, unspecified: Secondary | ICD-10-CM

## 2023-12-12 DIAGNOSIS — I714 Abdominal aortic aneurysm, without rupture, unspecified: Secondary | ICD-10-CM | POA: Diagnosis not present

## 2023-12-12 MED ORDER — PREDNISONE 20 MG PO TABS
ORAL_TABLET | ORAL | 0 refills | Status: DC
Start: 2023-12-12 — End: 2024-01-02

## 2023-12-12 NOTE — Patient Instructions (Addendum)
 Take one extra tablet of Gabapentin  at night while back is hurting  Zanaflex  / Tizanidine  muscle relaxer at night

## 2023-12-22 ENCOUNTER — Other Ambulatory Visit: Payer: Self-pay | Admitting: Primary Care

## 2023-12-22 DIAGNOSIS — E1142 Type 2 diabetes mellitus with diabetic polyneuropathy: Secondary | ICD-10-CM

## 2023-12-24 ENCOUNTER — Ambulatory Visit: Payer: Self-pay | Admitting: Family Medicine

## 2023-12-28 ENCOUNTER — Telehealth: Payer: Self-pay | Admitting: Primary Care

## 2023-12-28 NOTE — Telephone Encounter (Signed)
 Pt stated he is a returning a call back from [facility] the pharmacist and I assumed they may think it is you or either if you know anyone by that name.

## 2023-12-31 ENCOUNTER — Other Ambulatory Visit: Payer: Self-pay | Admitting: Primary Care

## 2023-12-31 DIAGNOSIS — R251 Tremor, unspecified: Secondary | ICD-10-CM

## 2023-12-31 NOTE — Telephone Encounter (Signed)
 Unable to reach patient. Left voicemail to return call to our office.

## 2023-12-31 NOTE — Telephone Encounter (Signed)
 Please call patient:  How is he doing on the primidone  medication that was prescribed for his hands shaking? Is he taking 1/2 or 1 full tablet?

## 2024-01-01 NOTE — Telephone Encounter (Unsigned)
 Copied from CRM [id number]. Topic: Clinical - Medication Question >> [name] 01, 2024 12:12 PM Tysheama G wrote: Reason for CRM: Patient is returning call from [facility] but she is out the office. (Per CAL) callback number is [phone]

## 2024-01-01 NOTE — Telephone Encounter (Signed)
 Spoke with pt asking Kate's questions. Pt states med is really working well and he takes 1 whole tab. Says he's totally out of pills.

## 2024-01-02 ENCOUNTER — Telehealth: Payer: Self-pay

## 2024-01-02 ENCOUNTER — Ambulatory Visit
Admission: RE | Admit: 2024-01-02 | Discharge: 2024-01-02 | Disposition: A | Discharge status: Home / Self Care | Source: Ambulatory Visit | Attending: Acute Care | Admitting: Acute Care

## 2024-01-02 ENCOUNTER — Other Ambulatory Visit: Payer: Self-pay | Admitting: Primary Care

## 2024-01-02 DIAGNOSIS — E1165 Type 2 diabetes mellitus with hyperglycemia: Secondary | ICD-10-CM

## 2024-01-02 DIAGNOSIS — Z87891 Personal history of nicotine dependence: Secondary | ICD-10-CM | POA: Diagnosis not present

## 2024-01-02 DIAGNOSIS — J309 Allergic rhinitis, unspecified: Secondary | ICD-10-CM

## 2024-01-02 DIAGNOSIS — K269 Duodenal ulcer, unspecified as acute or chronic, without hemorrhage or perforation: Secondary | ICD-10-CM

## 2024-01-02 DIAGNOSIS — Z122 Encounter for screening for malignant neoplasm of respiratory organs: Secondary | ICD-10-CM

## 2024-01-02 DIAGNOSIS — M545 Low back pain, unspecified: Secondary | ICD-10-CM

## 2024-01-02 DIAGNOSIS — F419 Anxiety disorder, unspecified: Secondary | ICD-10-CM

## 2024-01-02 DIAGNOSIS — J439 Emphysema, unspecified: Secondary | ICD-10-CM

## 2024-01-02 MED ORDER — PREDNISONE 20 MG PO TABS: ORAL_TABLET | ORAL | 0 refills | Status: DC

## 2024-01-02 NOTE — Telephone Encounter (Signed)
 Copied from CRM [id number]. Topic: General - Other >> [name] 02, 2024  8:47 AM Burnard DEL wrote: Reason for CRM: Patient was seen by Dr Watt and prescribed prednisone  for his back.He called in today stating that he is out of the medication and the pain has come back in his back.He would like to know what Dr Watt suggests for him to do next?

## 2024-01-02 NOTE — Addendum Note (Signed)
 Addended by: WATT MIRZA on: 01/02/2024 10:22 AM   Modules accepted: Orders

## 2024-01-02 NOTE — Telephone Encounter (Signed)
 I sent in a refill for his prednisone .  Can you ask him to see if he would be open to do physical therapy?  I think that this would likely help long-term.

## 2024-01-02 NOTE — Addendum Note (Signed)
 Addended by: WATT MIRZA on: 01/02/2024 02:51 PM   Modules accepted: Orders

## 2024-01-02 NOTE — Telephone Encounter (Signed)
 Noted.  Requested Prescriptions   Signed Prescriptions Disp Refills   primidone  (MYSOLINE ) 50 MG tablet 90 tablet 0    Sig: Take 1 tablet (50 mg total) by mouth at bedtime. For tremors    Authorizing Provider: Bruce Mayers K   Refill(s) sent to pharmacy.

## 2024-01-02 NOTE — Telephone Encounter (Signed)
PT orders sent.

## 2024-01-02 NOTE — Telephone Encounter (Signed)
 Mr. Sternberg notified as instructed by telephone.  He is agreeable to PT.  Prefers [place] location.

## 2024-01-10 ENCOUNTER — Other Ambulatory Visit: Payer: Self-pay

## 2024-01-10 DIAGNOSIS — Z87891 Personal history of nicotine dependence: Secondary | ICD-10-CM

## 2024-01-10 DIAGNOSIS — Z122 Encounter for screening for malignant neoplasm of respiratory organs: Secondary | ICD-10-CM

## 2024-01-18 ENCOUNTER — Encounter: Payer: Self-pay | Admitting: Pharmacist

## 2024-01-18 NOTE — Progress Notes (Signed)
 Pharmacy Quality Measure Review  This patient is appearing on a report for being at risk of failing the adherence measure for hypertension (ACEi/ARB) medications this calendar year.   Medication: lisinopril  2.5 mg Last fill date: 06/28/23 for 90 day supply  Medication has been discontinued. Patient is no longer taking.  No further action needed at this time.

## 2024-01-24 NOTE — Progress Notes (Signed)
 Remote PPM Transmission

## 2024-01-28 ENCOUNTER — Ambulatory Visit: Payer: Medicare Other

## 2024-01-31 ENCOUNTER — Encounter: Payer: Self-pay | Admitting: Primary Care

## 2024-01-31 ENCOUNTER — Ambulatory Visit (INDEPENDENT_AMBULATORY_CARE_PROVIDER_SITE_OTHER): Admitting: Primary Care

## 2024-01-31 VITALS — BP 132/72 | HR 76 | Temp 97.8°F | Ht 71.0 in | Wt 204.5 lb

## 2024-01-31 DIAGNOSIS — J019 Acute sinusitis, unspecified: Secondary | ICD-10-CM

## 2024-01-31 MED ORDER — AMOXICILLIN-POT CLAVULANATE 875-125 MG PO TABS: 1.0000 | ORAL_TABLET | Freq: Two times a day (BID) | ORAL | 0 refills | Status: DC

## 2024-01-31 NOTE — Patient Instructions (Signed)
 Start Augmentin  antibiotics. Take 1 tablet by mouth twice daily for 7 days.  Continue the Flonase  nasal spray.  It was a pleasure to see you today!

## 2024-01-31 NOTE — Assessment & Plan Note (Signed)
 HPI suggestive.  Given duration of symptoms, coupled with no improvement, will treat for presumed bacterial involvement.  Start Augmentin  antibiotics. Take 1 tablet by mouth twice daily for 7 days. Continue Flonase .  Follow-up as needed

## 2024-01-31 NOTE — Progress Notes (Signed)
 Subjective:    Patient ID: [name] Cook, male    DOB: 1950-04-18, 73 y.o.   MRN: [id number]  [name] Cook is a very pleasant 73 y.o. male with a history of hypertension, CAD with MI, COPD, OSA, type 2 diabetes, CKD who presents today to discuss sinus pressure.  Symptom onset 1 week ago with sore throat. He then developed rhinorrhea, nasal sinus pressure, cough, fatigue. He denies fevers, chills, body aches. He's noticed yellow mucous from his nose.   He's taken Flonase  with some improvement. Today he's feeling about the same. The night is the worst as his nose is congested.    Review of Systems  Constitutional:  Positive for fatigue. Negative for chills and fever.  HENT:  Positive for congestion, postnasal drip, rhinorrhea, sinus pressure and sore throat.   Respiratory:  Positive for cough.   Neurological:  Negative for headaches.         Past Medical History:  Diagnosis Date   Acute bronchitis with COPD (HCC) 03/07/2021   Acute pulmonary embolism (HCC) 03/08/2022   Arthritis    knees and lower back (03/14/2013)   Atrial flutter (HCC)    radiofrequency ablation in 2001   CAD (coronary artery disease)    a. Nonobstructive. Cardiac cath in 2001-50% mid RI, normal LM, LAD, RCA b. cath 10/16/2014 95% mid RCA treated with DES, 99% ost D1 medical management due to small aneurysmal segment   Chronic anticoagulation    chronic Coumadin  anticoagulation   Chronic obstructive pulmonary disease (HCC) 04/20/2011   Diabetes mellitus, type 2 (HCC)    Diaphoresis 10/26/2021   Elevated lipase 06/23/2021   GERD (gastroesophageal reflux disease)    GIB (gastrointestinal bleeding) 03/08/2022   Hyperlipidemia    Hypertension    with hypertensive heart disease   Left knee pain 10/25/2017   medial   Obesity    Persistent atrial fibrillation (HCC)    recurrent atrial flutter since 2001 s/p DCCVs, multiple failed AADs, h/o tachy-mediated cardiomyopathy   Presence of Watchman left atrial  appendage closure device 06/22/2022   Watchman  31mm FLX placed with Dr. Wonda   Pulmonary embolism (HCC) 03/09/2022   Shortness of breath    can come on at any time (03/14/2013)   Sleep apnea    dx'd; couldn't wear the mask (03/14/2013)   Symptomatic anemia 03/08/2022   Tobacco abuse    Upper GI bleed 12/20/2022   Urinary tract infection without hematuria 05/26/2022    Social History   Socioeconomic History   Marital status: Widowed    Spouse name: Not on file   Number of children: 1   Years of education: Not on file   Highest education level: Not on file  Occupational History   Occupation: Unemployed    Employer: UNEMPLOYED  Tobacco Use   Smoking status: Former    Current packs/day: 0.00    Average packs/day: 1 pack/day for 42.0 years (42.0 ttl pk-yrs)    Types: Cigarettes    Start date: 12/31/1971    Quit date: 12/30/2013    Years since quitting: 10.0   Smokeless tobacco: Never   Tobacco comments:    Former smoker 03/20/22  Vaping Use   Vaping status: Never Used  Substance and Sexual Activity   Alcohol use: Not Currently    Comment: 03/14/2013 stopped drinking back in 2002; never had problem w/it   Drug use: No   Sexual activity: Not Currently  Other Topics Concern   Not on file  Social History Narrative   Single.   Retired.    1 son, deceased.    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   Enjoys playing on the computer.       Social Drivers of Corporate investment banker Strain: Low Risk  (11/20/2023)   Overall Financial Resource Strain (CARDIA)    Difficulty of Paying Living Expenses: Not hard at all  Food Insecurity: No Food Insecurity (11/20/2023)   Hunger Vital Sign    Worried About Running Out of Food in the Last Year: Never true    Ran Out of Food in the Last Year: Never true  Transportation Needs: No Transportation Needs (11/20/2023)   PRAPARE - Administrator, Civil Service (Medical): No     Lack of Transportation (Non-Medical): No  Physical Activity: Insufficiently Active (11/20/2023)   Exercise Vital Sign    Days of Exercise per Week: 3 days    Minutes of Exercise per Session: 30 min  Stress: No Stress Concern Present (11/20/2023)   [employer] of Occupational Health - Occupational Stress Questionnaire    Feeling of Stress: Not at all  Social Connections: Socially Isolated (11/20/2023)   Social Connection and Isolation Panel    Frequency of Communication with Friends and Family: More than three times a week    Frequency of Social Gatherings with Friends and Family: Once a week    Attends Religious Services: Never    Database administrator or Organizations: No    Attends Banker Meetings: Never    Marital Status: Widowed  Intimate Partner Violence: Not At Risk (11/20/2023)   Humiliation, Afraid, Rape, and Kick questionnaire    Fear of Current or Ex-Partner: No    Emotionally Abused: No    Physically Abused: No    Sexually Abused: No    Past Surgical History:  Procedure Laterality Date   ATRIAL FLUTTER ABLATION  2002   atrial flutter; subsequently developed atrial fibrillation   AV NODE ABLATION  01/24/2013   CARDIAC CATHETERIZATION  2002   CARDIAC CATHETERIZATION N/A 10/16/2014   Procedure: Left Heart Cath and Coronary Angiography;  Surgeon: Ozell Fell, MD;  [facility] INVASIVE CV LAB;  Service: Cardiovascular;  Laterality: N/A;   CARDIOVERSION  05/31/2011   Procedure: CARDIOVERSION;  Surgeon: Lamar HERO. Juventino, MD;  Location: [facility] ORS;  Service: Cardiovascular;  Laterality: N/A;   CARPAL TUNNEL RELEASE Left 1980's   COLONOSCOPY WITH PROPOFOL  N/A 12/30/2018   Procedure: COLONOSCOPY WITH PROPOFOL ;  Surgeon: Therisa Bi, MD;  Location: [facility] ENDOSCOPY;  Service: Gastroenterology;  Laterality: N/A;   COLONOSCOPY WITH PROPOFOL  N/A 12/04/2019   Procedure: COLONOSCOPY WITH PROPOFOL ;  Surgeon: Therisa Bi, MD;  Location: [facility] ENDOSCOPY;  Service:  Gastroenterology;  Laterality: N/A;   COLONOSCOPY WITH PROPOFOL  N/A 06/13/2021   Procedure: COLONOSCOPY WITH PROPOFOL ;  Surgeon: Therisa Bi, MD;  Location: [facility] ENDOSCOPY;  Service: Gastroenterology;  Laterality: N/A;   ESOPHAGOGASTRODUODENOSCOPY N/A 03/01/2022   Procedure: ESOPHAGOGASTRODUODENOSCOPY (EGD);  Surgeon: Rosalie Kitchens, MD;  Location: [facility] ENDOSCOPY;  Service: Gastroenterology;  Laterality: N/A;   ESOPHAGOGASTRODUODENOSCOPY (EGD) WITH PROPOFOL  N/A 12/21/2022   Procedure: ESOPHAGOGASTRODUODENOSCOPY (EGD) WITH PROPOFOL ;  Surgeon: Leigh Elspeth SQUIBB, MD;  Location: [facility] ENDOSCOPY;  Service: Gastroenterology;  Laterality: N/A;   ESOPHAGOGASTRODUODENOSCOPY (EGD) WITH PROPOFOL  N/A 12/23/2022   Procedure: ESOPHAGOGASTRODUODENOSCOPY (EGD) WITH PROPOFOL ;  Surgeon: Leigh Elspeth SQUIBB, MD;  Location: [facility] ENDOSCOPY;  Service: Gastroenterology;  Laterality: N/A;   ESOPHAGOGASTRODUODENOSCOPY (EGD) WITH PROPOFOL  N/A  03/14/2023   Procedure: ESOPHAGOGASTRODUODENOSCOPY (EGD) WITH PROPOFOL ;  Surgeon: Therisa Bi, MD;  Location: [facility] ENDOSCOPY;  Service: Gastroenterology;  Laterality: N/A;   GIVENS CAPSULE STUDY Left 03/10/2022   Procedure: GIVENS CAPSULE STUDY;  Surgeon: Burnette Fallow, MD;  Location: [facility] ENDOSCOPY;  Service: Gastroenterology;  Laterality: Left;   HEMOSTASIS CLIP PLACEMENT  12/23/2022   Procedure: HEMOSTASIS CLIP PLACEMENT;  Surgeon: Leigh Elspeth SQUIBB, MD;  Location: [facility] ENDOSCOPY;  Service: Gastroenterology;;   HEMOSTASIS CONTROL  12/21/2022   Procedure: HEMOSTASIS CONTROL;  Surgeon: Leigh Elspeth SQUIBB, MD;  Location: [facility] ENDOSCOPY;  Service: Gastroenterology;;   INSERT / REPLACE / REMOVE PACEMAKER  01/24/2013    Medtronic Adapta L dual-chamber pacemaker, serial number [id number]    LEFT ATRIAL APPENDAGE OCCLUSION N/A 06/22/2022   Procedure: LEFT ATRIAL APPENDAGE OCCLUSION;  Surgeon: Wonda Sharper, MD;  Location: [facility] INVASIVE CV LAB;  Service: Cardiovascular;  Laterality: N/A;   LEFT HEART  CATH AND CORONARY ANGIOGRAPHY N/A 06/07/2018   Procedure: LEFT HEART CATH AND CORONARY ANGIOGRAPHY;  Surgeon: Wonda Sharper, MD;  Location: [facility] INVASIVE CV LAB;  Service: Cardiovascular;  Laterality: N/A;   LEFT HEART CATHETERIZATION WITH CORONARY ANGIOGRAM N/A 03/17/2013   Procedure: LEFT HEART CATHETERIZATION WITH CORONARY ANGIOGRAM;  Surgeon: Lonni JONETTA Cash, MD; LAD mild dz, D1 branch 100%, inferior branch 99%, CFX OK, RCA 50%, EF 65%     LOOP RECORDER IMPLANT  2002   PERMANENT PACEMAKER INSERTION N/A 01/24/2013   Procedure: PERMANENT PACEMAKER INSERTION;  Surgeon: Danelle LELON Birmingham, MD;  Location: [facility] CATH LAB;  Service: Cardiovascular;  Laterality: N/A;   TEE WITHOUT CARDIOVERSION N/A 06/22/2022   Procedure: TRANSESOPHAGEAL ECHOCARDIOGRAM;  Surgeon: Wonda Sharper, MD;  Location: [facility] INVASIVE CV LAB;  Service: Cardiovascular;  Laterality: N/A;   TIBIAL TUBERCLERPLASTY  ~ 2003    Family History  Problem Relation Age of Onset   Dementia Mother    Alzheimer's disease Mother    Osteoporosis Mother     Allergies  Allergen Reactions   Januvia  [Sitagliptin ] Other (See Comments)    Elevated lipase   Metformin  And Related Diarrhea    Current Outpatient Medications on File Prior to Visit  Medication Sig Dispense Refill   albuterol  (VENTOLIN  HFA) 108 (90 Base) MCG/ACT inhaler INHALE 2 PUFFS BY MOUTH EVERY 4 HOURS AS NEEDED FOR WHEEZE OR FOR SHORTNESS OF BREATH 18 each 0   Ascorbic Acid (VITAMIN C) 1000 MG tablet Take 2,000 mg by mouth daily.     aspirin  EC 81 MG tablet Take 1 tablet (81 mg total) by mouth daily. Swallow whole.     blood glucose meter kit and supplies KIT Dispense based on patient and insurance preference. Use up to four times daily as directed. (FOR ICD-9 250.00, 250.01). 1 each 0   budesonide -formoterol  (SYMBICORT ) 160-4.5 MCG/ACT inhaler Inhale 2 puffs into the lungs 2 (two) times daily as needed.     clopidogrel  (PLAVIX ) 75 MG tablet Take 1 tablet (75 mg total) by  mouth daily. 90 tablet 2   empagliflozin  (JARDIANCE ) 25 MG TABS tablet TAKE 1 TABLET (25 MG TOTAL) BY MOUTH DAILY BEFORE BREAKFAST. FOR DIABETES. 90 tablet 1   fluticasone  (FLONASE ) 50 MCG/ACT nasal spray PLACE 1 SPRAY INTO BOTH NOSTRILS DAILY AS NEEDED FOR ALLERGIES OR RHINITIS. 48 mL 2   gabapentin  (NEURONTIN ) 300 MG capsule TAKE 1 CAPSULE BY MOUTH EVERY MORNING AND 2 CAPSULES BY MOUTH AT NIGHT FOR NEUROPATHY 270 capsule 2   glipiZIDE  (GLUCOTROL  XL) 5 MG 24 hr tablet TAKE  1 TABLET BY MOUTH DAILY WITH BREAKFAST FOR DIABETES 90 tablet 1   glucose blood (ACCU-CHEK GUIDE) test strip TEST FOUR TIMES A DAY 400 strip 0   isosorbide  mononitrate (IMDUR ) 60 MG 24 hr tablet TAKE 1 TABLET BY MOUTH EVERY DAY 90 tablet 3   nitroGLYCERIN  (NITROSTAT ) 0.4 MG SL tablet Place 1 tablet (0.4 mg total) under the tongue every 5 (five) minutes as needed for chest pain. Do not exceed 3 tablets in 24 hours. 25 tablet 0   pantoprazole  (PROTONIX ) 40 MG tablet TAKE 1 TABLET (40 MG TOTAL) BY MOUTH DAILY. FOR ULCER 90 tablet 2   predniSONE  (DELTASONE ) 20 MG tablet 2 tabs po daily for 5 days, then 1 tab po daily for 5 days 15 tablet 0   primidone  (MYSOLINE ) 50 MG tablet Take 1 tablet (50 mg total) by mouth at bedtime. For tremors 90 tablet 0   rosuvastatin  (CRESTOR ) 20 MG tablet TAKE 1 TABLET BY MOUTH EVERY DAY IN THE EVENING FOR CHOLESTEROL 90 tablet 3   sertraline  (ZOLOFT ) 100 MG tablet TAKE 1.5 TABLETS (150 MG TOTAL) BY MOUTH DAILY. FOR ANXIETY AND DEPRESSION. 135 tablet 2   sildenafil  (VIAGRA ) 25 MG tablet Take 2 to 3 tablets as needed 28 tablet 3   tamsulosin  (FLOMAX ) 0.4 MG CAPS capsule TAKE 1 CAPSULE BY MOUTH EVERY DAY 90 capsule 3   tiZANidine  (ZANAFLEX ) 4 MG tablet TAKE 1 TABLET BY MOUTH AT BEDTIME AS NEEDED FOR MUSCLE SPASMS. 90 tablet 1   TYLENOL  500 MG tablet Take 1,000 mg by mouth daily.     No current facility-administered medications on file prior to visit.    BP 132/72   Pulse 76   Temp 97.8 F (36.6 C)  (Oral)   Ht 5' 11 (1.803 m)   Wt 204 lb 8 oz (92.8 kg)   SpO2 97%   BMI 28.52 kg/m  Objective:   Physical Exam Constitutional:      Appearance: He is not ill-appearing.  HENT:     Right Ear: Tympanic membrane and ear canal normal.     Left Ear: Tympanic membrane and ear canal normal.     Nose: No mucosal edema.     Right Sinus: No maxillary sinus tenderness or frontal sinus tenderness.     Left Sinus: No maxillary sinus tenderness or frontal sinus tenderness.     Mouth/Throat:     Mouth: Mucous membranes are moist.  Eyes:     Conjunctiva/sclera: Conjunctivae normal.  Cardiovascular:     Rate and Rhythm: Normal rate and regular rhythm.  Pulmonary:     Effort: Pulmonary effort is normal.     Breath sounds: Normal breath sounds. No wheezing or rhonchi.  Musculoskeletal:     Cervical back: Neck supple.  Skin:    General: Skin is warm and dry.     Physical Exam        Assessment & Plan:  Acute non-recurrent sinusitis, unspecified location Assessment & Plan: HPI suggestive.  Given duration of symptoms, coupled with no improvement, will treat for presumed bacterial involvement.  Start Augmentin  antibiotics. Take 1 tablet by mouth twice daily for 7 days. Continue Flonase .  Follow-up as needed  Orders: -     Amoxicillin -Pot Clavulanate; Take 1 tablet by mouth 2 (two) times daily.  Dispense: 14 tablet; Refill: 0    Assessment and Plan Assessment & Plan         Comer MARLA Gaskins, NP     History of Present Illness

## 2024-02-11 ENCOUNTER — Ambulatory Visit: Admitting: Cardiology

## 2024-02-25 ENCOUNTER — Ambulatory Visit: Admitting: General Practice

## 2024-02-25 ENCOUNTER — Encounter: Payer: Self-pay | Admitting: General Practice

## 2024-02-25 ENCOUNTER — Ambulatory Visit: Payer: Self-pay | Admitting: General Practice

## 2024-02-25 ENCOUNTER — Ambulatory Visit (INDEPENDENT_AMBULATORY_CARE_PROVIDER_SITE_OTHER)
Admission: RE | Admit: 2024-02-25 | Discharge: 2024-02-25 | Disposition: A | Source: Ambulatory Visit | Attending: General Practice | Admitting: General Practice

## 2024-02-25 VITALS — BP 120/64 | HR 88 | Temp 98.0°F | Ht 71.0 in | Wt 201.0 lb

## 2024-02-25 DIAGNOSIS — R053 Chronic cough: Secondary | ICD-10-CM

## 2024-02-25 MED ORDER — BENZONATATE 200 MG PO CAPS: 200.0000 mg | ORAL_CAPSULE | Freq: Three times a day (TID) | ORAL | 0 refills | Status: DC | PRN

## 2024-02-25 NOTE — Patient Instructions (Addendum)
 Complete xray(s) prior to leaving today. I will notify you of your results once received.  Start Benzonatate  capsules for cough. Take 1 capsule by mouth three times daily as needed for cough.   Nasal Congestion/Ear Pressure/Sinus Pressure: Try using Flonase  (fluticasone ) nasal spray. Instill 1 spray in each nostril twice daily.   Continue mucinex  .   Stay hydrated and drink plenty of fluids, rest and use humidifier.   Update me if your symptoms worsen or do not improve.   It was a pleasure to see you today!

## 2024-02-25 NOTE — Progress Notes (Signed)
 Established Patient Office Visit  Subjective   Patient ID: [name] Cook, male    DOB: Aug 21, 1950  Age: 73 y.o. MRN: [id number]  Chief Complaint  Patient presents with   Cough    Is productive and a lot of congestion in his nose and chest x 2 weeks. Patient has been taking mucinex  for his sx. Patient did see Mallie 01/31/24 and was given abx and states that helped some.     HPI  [name] Cook is a 73 year old male, patient of Mallie Gaskins, NP, with past medical history of HTN, A flutter, CAD, COPD, OSA GERD, DM2, OA presents today for an acute visit.   Discussed the use of AI scribe software for clinical note transcription with the patient, who gave verbal consent to proceed.  History of Present Illness [name] Cook is a 73 year old male with COPD who presents with persistent respiratory symptoms.  His symptoms began in the first week of October. He initially saw Mallie Gaskins, NP,  on October 16th and was diagnosed with a sinus infection. He was prescribed Augmentin , which he completed over seven days, taking one pill twice daily. Although he feels slightly better, his symptoms have not fully resolved.  He continues to experience a cough productive of yellow sputum, nasal and chest congestion, and occasional wheezing. No ear pain, sore throat, fever, chills, fatigue, chest pain, or shortness of breath. He experienced dizziness on Friday and occasional difficulty catching his breath. His cough is worse when he first lies down but improves thereafter, occurring intermittently throughout the day.  He has a history of COPD and uses Symbicort  and albuterol  as needed. He has been using Mucinex  and Flonase  daily, which have provided some relief but not complete resolution of symptoms. He has not received his flu or COVID vaccinations this season, and his last pneumonia shot was in 2019.  He denies any urinary or gastrointestinal symptoms. He mentions that his girlfriend's mother also has a sinus infection,  but he has not been around anyone else who is sick. He has not been tested for COVID or flu since the onset of his symptoms.   Patient Active Problem List   Diagnosis Date Noted   Persistent cough for 3 weeks or longer 02/25/2024   Hypotension 10/30/2023   Anemia, posthemorrhagic, acute 12/21/2022   Duodenal ulcer 12/21/2022   Atrial fibrillation (HCC) 06/22/2022   Presence of Watchman left atrial appendage closure device 06/22/2022   Tremor of both hands 04/28/2022   Secondary hypercoagulable state 03/20/2022   Class 1 obesity 03/09/2022   Neuropathy due to secondary diabetes (HCC) 07/28/2021   History of duodenal ulcer 02/06/2020   Antiplatelet or antithrombotic long-term use 10/09/2019   Polyp of colon    History of prostate cancer 06/10/2018   Acute sinusitis 02/01/2018   OSA (obstructive sleep apnea) 10/01/2017   Encounter for annual general medical examination with abnormal findings in adult 10/01/2017   Osteoarthritis 06/26/2017   Insomnia 10/01/2014   Anxiety and depression 03/09/2014   Coronary artery disease 11/06/2013   Long term current use of anticoagulant therapy 06/19/2013   Pacemaker 04/30/2013   Acute myocardial infarction, subendocardial infarction, initial episode of care (HCC) 03/15/2013   Chronic kidney disease, 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/26/2011   Chronic obstructive pulmonary disease (HCC) 04/20/2011   Hypertension    Hyperlipidemia    Type 2 diabetes mellitus with hyperglycemia (HCC)    Atrial flutter (HCC)    GASTROESOPHAGEAL REFLUX DISEASE 05/16/2010  Past Medical History:  Diagnosis Date   Acute bronchitis with COPD (HCC) 03/07/2021   Acute pulmonary embolism (HCC) 03/08/2022   Arthritis    knees and lower back (03/14/2013)   Atrial flutter (HCC)    radiofrequency ablation in 2001   CAD (coronary artery disease)    a. Nonobstructive. Cardiac cath in 2001-50% mid RI, normal LM, LAD, RCA b. cath 10/16/2014 95% mid RCA treated with DES, 99% ost D1  medical management due to small aneurysmal segment   Chronic anticoagulation    chronic Coumadin  anticoagulation   Chronic obstructive pulmonary disease (HCC) 04/20/2011   Diabetes mellitus, type 2 (HCC)    Diaphoresis 10/26/2021   Elevated lipase 06/23/2021   GERD (gastroesophageal reflux disease)    GIB (gastrointestinal bleeding) 03/08/2022   Hyperlipidemia    Hypertension    with hypertensive heart disease   Left knee pain 10/25/2017   medial   Obesity    Persistent atrial fibrillation (HCC)    recurrent atrial flutter since 2001 s/p DCCVs, multiple failed AADs, h/o tachy-mediated cardiomyopathy   Presence of Watchman left atrial appendage closure device 06/22/2022   Watchman  31mm FLX placed with Dr. Wonda   Pulmonary embolism (HCC) 03/09/2022   Shortness of breath    can come on at any time (03/14/2013)   Sleep apnea    dx'd; couldn't wear the mask (03/14/2013)   Symptomatic anemia 03/08/2022   Tobacco abuse    Upper GI bleed 12/20/2022   Urinary tract infection without hematuria 05/26/2022   Past Surgical History:  Procedure Laterality Date   ATRIAL FLUTTER ABLATION  2002   atrial flutter; subsequently developed atrial fibrillation   AV NODE ABLATION  01/24/2013   CARDIAC CATHETERIZATION  2002   CARDIAC CATHETERIZATION N/A 10/16/2014   Procedure: Left Heart Cath and Coronary Angiography;  Surgeon: Ozell Wonda, MD;  Location: [facility] INVASIVE CV LAB;  Service: Cardiovascular;  Laterality: N/A;   CARDIOVERSION  05/31/2011   Procedure: CARDIOVERSION;  Surgeon: Lamar HERO. Juventino, MD;  Location: [facility] ORS;  Service: Cardiovascular;  Laterality: N/A;   CARPAL TUNNEL RELEASE Left 1980's   COLONOSCOPY WITH PROPOFOL  N/A 12/30/2018   Procedure: COLONOSCOPY WITH PROPOFOL ;  Surgeon: Therisa Bi, MD;  Location: [facility] ENDOSCOPY;  Service: Gastroenterology;  Laterality: N/A;   COLONOSCOPY WITH PROPOFOL  N/A 12/04/2019   Procedure: COLONOSCOPY WITH PROPOFOL ;  Surgeon: Therisa Bi, MD;   Location: [facility] ENDOSCOPY;  Service: Gastroenterology;  Laterality: N/A;   COLONOSCOPY WITH PROPOFOL  N/A 06/13/2021   Procedure: COLONOSCOPY WITH PROPOFOL ;  Surgeon: Therisa Bi, MD;  Location: [facility] ENDOSCOPY;  Service: Gastroenterology;  Laterality: N/A;   ESOPHAGOGASTRODUODENOSCOPY N/A 03/01/2022   Procedure: ESOPHAGOGASTRODUODENOSCOPY (EGD);  Surgeon: Rosalie Kitchens, MD;  Location: [facility];  Service: Gastroenterology;  Laterality: N/A;   ESOPHAGOGASTRODUODENOSCOPY (EGD) WITH PROPOFOL  N/A 12/21/2022   Procedure: ESOPHAGOGASTRODUODENOSCOPY (EGD) WITH PROPOFOL ;  Surgeon: Leigh Elspeth SQUIBB, MD;  Location: [facility] ENDOSCOPY;  Service: Gastroenterology;  Laterality: N/A;   ESOPHAGOGASTRODUODENOSCOPY (EGD) WITH PROPOFOL  N/A 12/23/2022   Procedure: ESOPHAGOGASTRODUODENOSCOPY (EGD) WITH PROPOFOL ;  Surgeon: Leigh Elspeth SQUIBB, MD;  Location: [facility] ENDOSCOPY;  Service: Gastroenterology;  Laterality: N/A;   ESOPHAGOGASTRODUODENOSCOPY (EGD) WITH PROPOFOL  N/A 03/14/2023   Procedure: ESOPHAGOGASTRODUODENOSCOPY (EGD) WITH PROPOFOL ;  Surgeon: Therisa Bi, MD;  Location: [facility] ENDOSCOPY;  Service: Gastroenterology;  Laterality: N/A;   GIVENS CAPSULE STUDY Left 03/10/2022   Procedure: GIVENS CAPSULE STUDY;  Surgeon: Burnette Fallow, MD;  Location: [facility] ENDOSCOPY;  Service: Gastroenterology;  Laterality: Left;   HEMOSTASIS CLIP PLACEMENT  12/23/2022  Procedure: HEMOSTASIS CLIP PLACEMENT;  Surgeon: Leigh Elspeth SQUIBB, MD;  Location: [facility] ENDOSCOPY;  Service: Gastroenterology;;   HEMOSTASIS CONTROL  12/21/2022   Procedure: HEMOSTASIS CONTROL;  Surgeon: Leigh Elspeth SQUIBB, MD;  Location: [facility] ENDOSCOPY;  Service: Gastroenterology;;   INSERT / REPLACE / REMOVE PACEMAKER  01/24/2013    Medtronic Adapta L dual-chamber pacemaker, serial number [id number]    LEFT ATRIAL APPENDAGE OCCLUSION N/A 06/22/2022   Procedure: LEFT ATRIAL APPENDAGE OCCLUSION;  Surgeon: Wonda Sharper, MD;  Location: [facility] INVASIVE CV LAB;  Service:  Cardiovascular;  Laterality: N/A;   LEFT HEART CATH AND CORONARY ANGIOGRAPHY N/A 06/07/2018   Procedure: LEFT HEART CATH AND CORONARY ANGIOGRAPHY;  Surgeon: Wonda Sharper, MD;  Location: [facility] INVASIVE CV LAB;  Service: Cardiovascular;  Laterality: N/A;   LEFT HEART CATHETERIZATION WITH CORONARY ANGIOGRAM N/A 03/17/2013   Procedure: LEFT HEART CATHETERIZATION WITH CORONARY ANGIOGRAM;  Surgeon: Lonni JONETTA Cash, MD; LAD mild dz, D1 branch 100%, inferior branch 99%, CFX OK, RCA 50%, EF 65%     LOOP RECORDER IMPLANT  2002   PERMANENT PACEMAKER INSERTION N/A 01/24/2013   Procedure: PERMANENT PACEMAKER INSERTION;  Surgeon: Danelle LELON Birmingham, MD;  Location: [facility] CATH LAB;  Service: Cardiovascular;  Laterality: N/A;   TEE WITHOUT CARDIOVERSION N/A 06/22/2022   Procedure: TRANSESOPHAGEAL ECHOCARDIOGRAM;  Surgeon: Wonda Sharper, MD;  Location: [facility] INVASIVE CV LAB;  Service: Cardiovascular;  Laterality: N/A;   TIBIAL TUBERCLERPLASTY  ~ 2003   Allergies  Allergen Reactions   Januvia  [Sitagliptin ] Other (See Comments)    Elevated lipase   Metformin  And Related Diarrhea         02/25/2024    8:55 AM 01/31/2024    2:21 PM 11/20/2023   10:41 AM  Depression screen PHQ 2/9  Decreased Interest 0 3 0  Down, Depressed, Hopeless 0 0 0  PHQ - 2 Score 0 3 0  Altered sleeping 2 3 0  Tired, decreased energy 1 0 0  Change in appetite 0 0 2  Feeling bad or failure about yourself  0 0 0  Trouble concentrating 0 0 0  Moving slowly or fidgety/restless 0 1 0  Suicidal thoughts 0 0 0  PHQ-9 Score 3 7  2    Difficult doing work/chores Not difficult at all Not difficult at all Not difficult at all     Data saved with a previous flowsheet row definition       02/25/2024    8:56 AM 01/31/2024    2:22 PM 10/30/2023   10:56 AM 08/06/2023    1:28 PM  GAD 7 : Generalized Anxiety Score  Nervous, Anxious, on Edge 0 0 2   Control/stop worrying 0 0 2   Worry too much - different things 0 0 0   Trouble relaxing 0 1 0    Restless 0 2 2   Easily annoyed or irritable 0 0 2   Afraid - awful might happen 0 0 0   Total GAD 7 Score 0 3 8   Anxiety Difficulty Not difficult at all Not difficult at all Somewhat difficult      Information is confidential and restricted. Go to Review Flowsheets to unlock data.      Review of Systems  Constitutional:  Negative for chills and fever.  HENT:  Positive for congestion and sinus pain. Negative for ear pain and sore throat.   Respiratory:  Positive for cough, sputum production and wheezing. Negative for shortness of breath.   Cardiovascular:  Negative for chest  pain.  Gastrointestinal:  Negative for abdominal pain, constipation, diarrhea, heartburn, nausea and vomiting.  Genitourinary:  Negative for dysuria, frequency and urgency.  Neurological:  Positive for dizziness. Negative for headaches.  Endo/Heme/Allergies:  Negative for polydipsia.  Psychiatric/Behavioral:  Negative for depression and suicidal ideas. The patient is not nervous/anxious.       Objective:     BP 120/64   Pulse 88   Temp 98 F (36.7 C) (Oral)   Ht 5' 11 (1.803 m)   Wt 201 lb (91.2 kg)   SpO2 97%   BMI 28.03 kg/m  BP Readings from Last 3 Encounters:  02/25/24 120/64  01/31/24 132/72  12/12/23 90/60   Wt Readings from Last 3 Encounters:  02/25/24 201 lb (91.2 kg)  01/31/24 204 lb 8 oz (92.8 kg)  12/12/23 204 lb 8 oz (92.8 kg)      Physical Exam Vitals and nursing note reviewed.  Constitutional:      Appearance: Normal appearance.  Cardiovascular:     Rate and Rhythm: Normal rate and regular rhythm.     Pulses: Normal pulses.     Heart sounds: Normal heart sounds.  Pulmonary:     Effort: Pulmonary effort is normal.     Breath sounds: Normal breath sounds.  Neurological:     Mental Status: He is alert and oriented to person, place, and time.  Psychiatric:        Mood and Affect: Mood normal.        Behavior: Behavior normal.        Thought Content: Thought content  normal.        Judgment: Judgment normal.      No results found for any visits on 02/25/24.     The ASCVD Risk score (Arnett DK, et al., 2019) failed to calculate for the following reasons:   Risk score cannot be calculated because patient has a medical history suggesting prior/existing ASCVD    Assessment & Plan:  Persistent cough for 3 weeks or longer -     DG Chest 2 View -     Benzonatate ; Take 1 capsule (200 mg total) by mouth 3 (three) times daily as needed.  Dispense: 20 capsule; Refill: 0    Assessment and Plan Assessment & Plan Chronic cough with congestion and sinus congestion Persistent cough with yellow sputum and sinus congestion since early October. Partial improvement with Augmentin . Wheezing present. Differentials include pneumonia.  - Given history and presentation today, STAT chest xray ordered. - Continue Mucinex . - Continue Flonase .  - Prescribed Tessalon  Perles, one tablet up to three times a day. - Advised hydration, rest, and humidifier use at night. - Will follow up with x-ray results to determine need for additional antibiotics.  Chronic obstructive pulmonary disease (COPD) COPD managed with Symbicort  and albuterol  as needed. Recent lung scan in September. Current symptoms include wheezing and occasional difficulty breathing. - Continue current COPD management with Symbicort  and albuterol  as needed.   Return if symptoms worsen or fail to improve.    Carrol Aurora, NP

## 2024-02-27 NOTE — Telephone Encounter (Signed)
 Copied from CRM [id number]. Topic: Clinical - Lab/Test Results >> Feb 27, 2024  1:24 PM Mesmerise C wrote: Reason for CRM: Patient calling to get his chest xray results doesn't have MyChart and office is closed for meeting can patient be called back to be provided results

## 2024-02-28 ENCOUNTER — Ambulatory Visit: Admitting: Pulmonary Disease

## 2024-02-28 NOTE — Telephone Encounter (Unsigned)
 Copied from CRM [id number]. Topic: Clinical - Lab/Test Results >> Feb 28, 2024  9:20 AM Vena HERO wrote: Reason for CRM: Pt returned call for test results. I read Dr Jonothan msg to him and he states he is feeling better.

## 2024-03-04 ENCOUNTER — Ambulatory Visit: Payer: Self-pay | Admitting: *Deleted

## 2024-03-04 NOTE — Telephone Encounter (Signed)
 Attempted to call patient.  No answer, was unable to leave message due to mailbox being full.  Will attempt to call again.
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
## 2024-03-04 NOTE — Telephone Encounter (Signed)
 Attempted to call a second time.  No answer and unable to leave a message due to the mailbox being full.

## 2024-03-05 ENCOUNTER — Encounter: Payer: Self-pay | Admitting: General Practice

## 2024-03-05 ENCOUNTER — Ambulatory Visit (INDEPENDENT_AMBULATORY_CARE_PROVIDER_SITE_OTHER): Admitting: General Practice

## 2024-03-05 VITALS — BP 118/62 | HR 87 | Temp 97.9°F | Ht 71.0 in | Wt 200.0 lb

## 2024-03-05 DIAGNOSIS — R410 Disorientation, unspecified: Secondary | ICD-10-CM | POA: Diagnosis not present
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
## 2024-03-05 NOTE — Patient Instructions (Signed)
 Someone will call you to schedule the CT scan. Let me know by tomorrow if you don't hear anything.   Please go to the ER if your symptoms worsen.   It was a pleasure to see you today!

## 2024-03-05 NOTE — Telephone Encounter (Signed)
 Noted

## 2024-03-10 ENCOUNTER — Telehealth: Payer: Self-pay

## 2024-03-10 NOTE — Telephone Encounter (Signed)
 Copied from CRM [id number]. Topic: Referral - Question >> Mar 10, 2024 10:14 AM Darshell M wrote: Reason for CRM: Patient follow up on CT referral. Order is in but referral is pending. Checked with CAL and Advised patient the referral is pending. Patient requesting call back to schedule CB# [phone].

## 2024-03-17 ENCOUNTER — Ambulatory Visit: Payer: Self-pay

## 2024-03-17 ENCOUNTER — Telehealth: Payer: Self-pay | Admitting: Primary Care

## 2024-03-17 NOTE — Telephone Encounter (Signed)
 FYI Only or Action Required?: FYI only for provider: appointment scheduled on 03/19/24.  Patient was last seen in primary care on 03/05/2024 by Vincente Shivers, NP.  Called Nurse Triage reporting Dizziness.  Symptoms began about a month ago.  Interventions attempted: Nothing.  Symptoms are: stable.  Triage Disposition: See PCP When Office is Open (Within 3 Days)  Patient/caregiver understands and will follow disposition?: Yes Reason for Disposition  [1] MODERATE dizziness (e.g., interferes with normal activities) AND [2] has been evaluated by doctor (or NP/PA) for this  Answer Assessment - Initial Assessment Questions Patient states he had a fall in the yard last Wednesday, denies injury. Patient states he lives with his girlfriend and her mom. Patient denies checking BS this morning but it was 114 last night. BP was 104/68 96HR last night. 115/74 right now 91HR. Patient takes blood pressure medication. Patient states he is experiencing SOB, that's been happening for a few months but has not worsened. Experiences SOB when walking around the house. Patient states he has talked to PCP about this and was taken off some medication. Advised UC for symptoms today and patient denied as they charge too much. Patient is requesting appointment with provider   1. DESCRIPTION: Describe your dizziness.     Feels like going to fall  2. LIGHTHEADED: Do you feel lightheaded? (e.g., somewhat faint, woozy, weak upon standing)     Feeling weak and confused, like going to fall  3. VERTIGO: Do you feel like either you or the room is spinning or tilting? (i.e., vertigo)     Denies  4. SEVERITY: How bad is it?  Do you feel like you are going to faint? Can you stand and walk?     Getting worse  5. ONSET:  When did the dizziness begin?     1 month  6. AGGRAVATING FACTORS: Does anything make it worse? (e.g., standing, change in head position)     Unsure, happens randomly  7. HEART RATE:  Can you tell me your heart rate? How many beats in 15 seconds?  (Note: Not all patients can do this.)       91  8. CAUSE: What do you think is causing the dizziness? (e.g., decreased fluids or food, diarrhea, emotional distress, heat exposure, new medicine, sudden standing, vomiting; unknown)     Unsure, states legs feel numb and weak but has neuropathy because diabetic.   9. RECURRENT SYMPTOM: Have you had dizziness before? If Yes, ask: When was the last time? What happened that time?     Denies  10. OTHER SYMPTOMS: Do you have any other symptoms? (e.g., fever, chest pain, vomiting, diarrhea, bleeding)       Denies  Protocols used: Dizziness - Lightheadedness-A-AH  Copied from CRM #[id number]. Topic: Clinical - Red Word Triage >> Mar 17, 2024 10:59 AM Montie POUR wrote: Red Word that prompted transfer to Nurse Triage:  Almost every day when he tries to do anything, he gets dizzy and feels like he will fall. This has been going on for about 1 month and getting worse.

## 2024-03-17 NOTE — Telephone Encounter (Signed)
 Copied from CRM [id number]. Topic: Referral - Status >> Mar 17, 2024 10:57 AM Montie POUR wrote: Reason for CRM:  Please call Nicki at [phone] to let him know the status of the MRI-CT Scan dated 03/05/24

## 2024-03-18 NOTE — Telephone Encounter (Signed)
 See newer telephone encounter.   LM for patient with scheduling instructions.    CT HEAD WO CONTRAST order sent to [facility] department.

## 2024-03-18 NOTE — Telephone Encounter (Signed)
 Looks like patient has not gotten scan done yet; sending message to referral team to check on the status of this.

## 2024-03-18 NOTE — Telephone Encounter (Signed)
 LM for patient with scheduling instructions.   CT HEAD WO CONTRAST order sent to [facility] department.    Patient may reach out to them to schedule their appointment at their convenience.    They can be reached at [phone] or [phone]  They will schedule at one of the [facility] locations:  [facility]

## 2024-03-18 NOTE — Telephone Encounter (Signed)
 Pt saw [facility] 03/05/24. I don't see that provider has reviewed results yet.

## 2024-03-18 NOTE — Progress Notes (Unsigned)
 Ceniya Fowers T. Lylla Eifler, MD, CAQ Sports Medicine [facility] [address] [place], [address]  Phone: [phone]  FAX: [phone]  [name] Cook - 73 y.o. male  MRN [id number]  Date of Birth: 09-21-1950  Date: 03/19/2024  PCP: Gretta Comer POUR, NP  Referral: Gretta Comer POUR, NP  No chief complaint on file.  Subjective:   [name] Cook is a 73 y.o. very pleasant male patient with There is no height or weight on file to calculate BMI. who presents with the following:  Discussed the use of AI scribe software for clinical note transcription with the patient, who gave verbal consent to proceed.  I remember really very well.  He presents with some ongoing dizziness.  He saw Mrs. Vincente a couple of weeks ago with some confusion. History of Present Illness     Review of Systems is noted in the HPI, as appropriate  Objective:   There were no vitals taken for this visit.  GEN: No acute distress; alert,appropriate. PULM: Breathing comfortably in no respiratory distress PSYCH: Normally interactive.   Laboratory and Imaging Data:  Assessment and Plan:   No diagnosis found. Assessment & Plan   Medication Management during today's office visit: No orders of the defined types were placed in this encounter.  There are no discontinued medications.  Orders placed today for conditions managed today: No orders of the defined types were placed in this encounter.   Disposition: No follow-ups on file.  Dr[employer] One speech-to-text software was used for transcription in this dictation.  Possible transcriptional errors can occur using Animal nutritionist.   Signed,  Jacques DASEN. Novah Goza, MD   Outpatient Encounter Medications as of 03/19/2024  Medication Sig   albuterol  (VENTOLIN  HFA) 108 (90 Base) MCG/ACT inhaler INHALE 2 PUFFS BY MOUTH EVERY 4 HOURS AS NEEDED FOR WHEEZE OR FOR SHORTNESS OF BREATH   Ascorbic Acid (VITAMIN C) 1000 MG  tablet Take 2,000 mg by mouth daily.   aspirin  EC 81 MG tablet Take 1 tablet (81 mg total) by mouth daily. Swallow whole.   benzonatate  (TESSALON ) 200 MG capsule Take 1 capsule (200 mg total) by mouth 3 (three) times daily as needed.   blood glucose meter kit and supplies KIT Dispense based on patient and insurance preference. Use up to four times daily as directed. (FOR ICD-9 250.00, 250.01).   budesonide -formoterol  (SYMBICORT ) 160-4.5 MCG/ACT inhaler Inhale 2 puffs into the lungs 2 (two) times daily as needed.   clopidogrel  (PLAVIX ) 75 MG tablet Take 1 tablet (75 mg total) by mouth daily.   empagliflozin  (JARDIANCE ) 25 MG TABS tablet TAKE 1 TABLET (25 MG TOTAL) BY MOUTH DAILY BEFORE BREAKFAST. FOR DIABETES.   fluticasone  (FLONASE ) 50 MCG/ACT nasal spray PLACE 1 SPRAY INTO BOTH NOSTRILS DAILY AS NEEDED FOR ALLERGIES OR RHINITIS.   gabapentin  (NEURONTIN ) 300 MG capsule TAKE 1 CAPSULE BY MOUTH EVERY MORNING AND 2 CAPSULES BY MOUTH AT NIGHT FOR NEUROPATHY   glipiZIDE  (GLUCOTROL  XL) 5 MG 24 hr tablet TAKE 1 TABLET BY MOUTH DAILY WITH BREAKFAST FOR DIABETES   glucose blood (ACCU-CHEK GUIDE) test strip TEST FOUR TIMES A DAY   isosorbide  mononitrate (IMDUR ) 60 MG 24 hr tablet TAKE 1 TABLET BY MOUTH EVERY DAY   nitroGLYCERIN  (NITROSTAT ) 0.4 MG SL tablet Place 1 tablet (0.4 mg total) under the tongue every 5 (five) minutes as needed for chest pain. Do not exceed 3 tablets in 24 hours.   pantoprazole  (PROTONIX ) 40  MG tablet TAKE 1 TABLET (40 MG TOTAL) BY MOUTH DAILY. FOR ULCER   predniSONE  (DELTASONE ) 20 MG tablet 2 tabs po daily for 5 days, then 1 tab po daily for 5 days   primidone  (MYSOLINE ) 50 MG tablet Take 1 tablet (50 mg total) by mouth at bedtime. For tremors   rosuvastatin  (CRESTOR ) 20 MG tablet TAKE 1 TABLET BY MOUTH EVERY DAY IN THE EVENING FOR CHOLESTEROL   sertraline  (ZOLOFT ) 100 MG tablet TAKE 1.5 TABLETS (150 MG TOTAL) BY MOUTH DAILY. FOR ANXIETY AND DEPRESSION.   sildenafil  (VIAGRA ) 25 MG  tablet Take 2 to 3 tablets as needed   tamsulosin  (FLOMAX ) 0.4 MG CAPS capsule TAKE 1 CAPSULE BY MOUTH EVERY DAY   tiZANidine  (ZANAFLEX ) 4 MG tablet TAKE 1 TABLET BY MOUTH AT BEDTIME AS NEEDED FOR MUSCLE SPASMS.   TYLENOL  500 MG tablet Take 1,000 mg by mouth daily.   No facility-administered encounter medications on file as of 03/19/2024.

## 2024-03-19 ENCOUNTER — Ambulatory Visit: Admitting: Family Medicine

## 2024-03-19 VITALS — BP 90/60 | HR 89 | Temp 97.8°F | Ht 71.0 in | Wt 201.5 lb

## 2024-03-19 DIAGNOSIS — N182 Chronic kidney disease, stage 2 (mild): Secondary | ICD-10-CM | POA: Diagnosis not present

## 2024-03-19 DIAGNOSIS — R5383 Other fatigue: Secondary | ICD-10-CM

## 2024-03-19 DIAGNOSIS — E1165 Type 2 diabetes mellitus with hyperglycemia: Secondary | ICD-10-CM

## 2024-03-19 DIAGNOSIS — R55 Syncope and collapse: Secondary | ICD-10-CM | POA: Diagnosis not present

## 2024-03-19 DIAGNOSIS — R42 Dizziness and giddiness: Secondary | ICD-10-CM | POA: Diagnosis not present

## 2024-03-19 DIAGNOSIS — H5581 Saccadic eye movements: Secondary | ICD-10-CM

## 2024-03-20 ENCOUNTER — Encounter: Payer: Self-pay | Admitting: Family Medicine

## 2024-03-20 LAB — VITAMIN B12: Vitamin B-12: 402 pg/mL (ref 211–911)

## 2024-03-20 LAB — BASIC METABOLIC PANEL WITH GFR
BUN: 22 mg/dL (ref 6–23)
CO2: 30 meq/L (ref 19–32)
Calcium: 9.5 mg/dL (ref 8.4–10.5)
Chloride: 103 meq/L (ref 96–112)
Creatinine, Ser: 1.02 mg/dL (ref 0.40–1.50)
GFR: 73.77 mL/min (ref >60.00)
Glucose, Bld: 127 mg/dL — ABNORMAL HIGH (ref 70–99)
Potassium: 4.9 meq/L (ref 3.5–5.1)
Sodium: 140 meq/L (ref 135–145)

## 2024-03-20 LAB — CBC WITH DIFFERENTIAL/PLATELET
Basophils Absolute: 0 K/uL (ref 0.0–0.1)
Basophils Relative: 0.9 % (ref 0.0–3.0)
Eosinophils Absolute: 0.2 K/uL (ref 0.0–0.7)
Eosinophils Relative: 4.1 % (ref 0.0–5.0)
HCT: 42.7 % (ref 39.0–52.0)
Hemoglobin: 14.4 g/dL (ref 13.0–17.0)
Lymphocytes Relative: 26.2 % (ref 12.0–46.0)
Lymphs Abs: 1.1 K/uL (ref 0.7–4.0)
MCHC: 33.7 g/dL (ref 30.0–36.0)
MCV: 95.8 fl (ref 78.0–100.0)
Monocytes Absolute: 0.3 K/uL (ref 0.1–1.0)
Monocytes Relative: 6.9 % (ref 3.0–12.0)
Neutro Abs: 2.7 K/uL (ref 1.4–7.7)
Neutrophils Relative %: 61.9 % (ref 43.0–77.0)
Platelets: 117 K/uL — ABNORMAL LOW (ref 150.0–400.0)
RBC: 4.45 Mil/uL (ref 4.22–5.81)
RDW: 14 % (ref 11.5–15.5)
WBC: 4.3 K/uL (ref 4.0–10.5)

## 2024-03-20 LAB — T4, FREE: Free T4: 0.56 ng/dL — ABNORMAL LOW (ref 0.60–1.60)

## 2024-03-20 LAB — HEPATIC FUNCTION PANEL
ALT: 24 U/L (ref 0–53)
AST: 26 U/L (ref 0–37)
Albumin: 4.3 g/dL (ref 3.5–5.2)
Alkaline Phosphatase: 63 U/L (ref 39–117)
Bilirubin, Direct: 0.1 mg/dL (ref 0.0–0.3)
Total Bilirubin: 0.6 mg/dL (ref 0.2–1.2)
Total Protein: 6 g/dL (ref 6.0–8.3)

## 2024-03-20 LAB — TSH: TSH: 2 u[IU]/mL (ref 0.35–5.50)

## 2024-03-20 LAB — HEMOGLOBIN A1C: Hgb A1c MFr Bld: 6.2 % (ref 4.6–6.5)

## 2024-03-21 ENCOUNTER — Ambulatory Visit: Admission: RE | Admit: 2024-03-21 | Discharge: 2024-03-21 | Attending: General Practice | Admitting: General Practice

## 2024-03-21 DIAGNOSIS — R410 Disorientation, unspecified: Secondary | ICD-10-CM

## 2024-03-26 ENCOUNTER — Ambulatory Visit: Payer: Self-pay | Admitting: General Practice

## 2024-03-26 ENCOUNTER — Ambulatory Visit: Payer: Self-pay | Admitting: Family Medicine

## 2024-03-27 ENCOUNTER — Ambulatory Visit: Payer: Self-pay

## 2024-03-27 NOTE — Telephone Encounter (Signed)
 FYI Only or Action Required?: FYI only for provider: Home care.  Patient was last seen in primary care on 03/19/2024 by Watt Mirza, MD.  Called Nurse Triage reporting Blood Sugar Problem.  Symptoms began yesterday.  Interventions attempted: Other: Ate a PB sandwich.  Symptoms are: completely resolved.  Triage Disposition: Home Care  Patient/caregiver understands and will follow disposition?: Yes Reason for Disposition  [1] Blood glucose 70 mg/dL (3.9 mmol/L) or below, OR symptomatic AND [2] cause known  Answer Assessment - Initial Assessment Questions Patient reports eating a peanut butter sandwich and felt better. Patient reports low blood sugar coming and going, states eating regularly throughout the day. Patient not home currently and unable to check blood sugar, advised patient when he gets home to check blood sugar and report back if having any symptoms or blood sugar is low.   1. SYMPTOMS: What symptoms are you concerned about?     Felt dizzy, like was going to pass out  2. ONSET:  When did the symptoms start?     630 pm  3. BLOOD GLUCOSE: What is your blood glucose level?      66 last night  4. USUAL RANGE: What is your blood glucose level usually? (e.g., usual fasting morning value, usual evening value)     Reports it going low off and on  5. TYPE 1 or 2:  Do you know what type of diabetes you have?  (e.g., Type 1, Type 2, Gestational; doesn't know)      Type 2  6. INSULIN : Do you take insulin ? What type of insulin (s) do you use? What is the mode of delivery? (syringe, pen; injection or pump) When did you last give yourself an insulin  dose? (i.e., time or hours/minutes ago) How much did you give? (i.e., how many units)     Does not take insulin   7. DIABETES PILLS: Do you take any pills for your diabetes? If Yes, ask: What is the name of the medicine(s) that you take for high blood sugar?     empagliflozin  (JARDIANCE ) 25 MG TABS tablet  8.  OTHER SYMPTOMS: Do you have any symptoms? (e.g., fever, frequent urination, difficulty breathing, vomiting)     Denies, states feels good  9. FOOD: When did you last eat or drink?       Ate a pack of peanut butter crackers and drank some water  10. ALONE: Are you alone right now or is someone with you?        Patient is not home but is alone  Protocols used: Diabetes - Low Blood Sugar-A-AH  Copied from CRM #[id number]. Topic: Clinical - Red Word Triage >> Mar 27, 2024 10:47 AM Terri MATSU wrote: Red Word that prompted transfer to Nurse Triage: Patient stated he has a low blood sugar reading of 66 and going down

## 2024-03-27 NOTE — Telephone Encounter (Signed)
 Agree that he needs to provide glucose readings before we make any changes. Will await readings.

## 2024-03-31 NOTE — Telephone Encounter (Signed)
 Read patient message. He would like to know what caused him to get dizzy? Patient states still dizzy upon standing and occasionally when driving.

## 2024-04-02 ENCOUNTER — Encounter: Payer: Self-pay | Admitting: Primary Care

## 2024-04-08 ENCOUNTER — Other Ambulatory Visit: Payer: Self-pay | Admitting: Primary Care

## 2024-04-08 DIAGNOSIS — R251 Tremor, unspecified: Secondary | ICD-10-CM

## 2024-04-08 MED ORDER — PRIMIDONE 50 MG PO TABS: 50.0000 mg | ORAL_TABLET | Freq: Every day | ORAL | 0 refills | Status: AC

## 2024-04-08 NOTE — Telephone Encounter (Unsigned)
 Copied from CRM #[id number]. Topic: Clinical - Medication Refill >> Apr 08, 2024  3:57 PM Jayma L wrote: Medication: primidone  (MYSOLINE ) 50 MG tablet  Has the patient contacted their pharmacy? Yes (Agent: If no, request that the patient contact the pharmacy for the refill. If patient does not wish to contact the pharmacy document the reason why and proceed with request.) (Agent: If yes, when and what did the pharmacy advise?)  This is the patient's preferred pharmacy:  [facility] [phone] [facility] [place] - [address] [name] [address] Phone: [phone] Fax: [phone]   Is this the correct pharmacy for this prescription? Yes If no, delete pharmacy and type the correct one.   Has the prescription been filled recently? No  Is the patient out of the medication? Yes  Has the patient been seen for an appointment in the last year OR does the patient have an upcoming appointment? Yes  Can we respond through MyChart? No  Agent: Please be advised that Rx refills may take up to 3 business days. We ask that you follow-up with your pharmacy.

## 2024-04-16 ENCOUNTER — Ambulatory Visit: Payer: Self-pay
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
## 2024-04-18 NOTE — Telephone Encounter (Signed)
 Noted, will await ED notes.

## 2024-04-20 ENCOUNTER — Other Ambulatory Visit: Payer: Self-pay | Admitting: Family Medicine

## 2024-04-21 NOTE — Progress Notes (Signed)
 [name] Cook                                          MRN: [id number]   04/21/2024   The [facility] Quality Team Specialist reviewed this patient medical record for the purposes of chart review for care gap closure. The following were reviewed: abstraction for care gap closure-glycemic status assessment.    [facility] Quality Team

## 2024-04-21 NOTE — Telephone Encounter (Signed)
 Please call patient:  Received refill request for Tizanidine  muscle relaxer medication that was prescribed by Dr. Watt in April 2025 for a back injury.   Is he still taking the muscle relaxer? How often?   If he doesn't need it then I recommend he discontinue as it can cause drowsiness.

## 2024-04-22 NOTE — Telephone Encounter (Signed)
 Lvm asking pt to call back. Pls relay Kate's message and get answers to her questions.

## 2024-04-24 ENCOUNTER — Ambulatory Visit: Admitting: Pulmonary Disease

## 2024-04-25 NOTE — Telephone Encounter (Signed)
 Lvm asking pt to call back. Pls relay Kate's message and get answers to her questions.

## 2024-04-28 ENCOUNTER — Ambulatory Visit: Payer: Medicare Other

## 2024-04-28 NOTE — Telephone Encounter (Signed)
 Lvm asking pt to call back. Pls relay Kate's message and get answers to her questions.   Mailing a letter.

## 2024-04-28 NOTE — Telephone Encounter (Signed)
Noted. Will remove from medication list.

## 2024-04-28 NOTE — Telephone Encounter (Signed)
 Pt indicated not currently taking. Relayed message from PCP that she recommended d/c. Pt understood and agreed.

## 2024-04-30 ENCOUNTER — Ambulatory Visit: Admitting: Primary Care

## 2024-05-01 ENCOUNTER — Ambulatory Visit: Admitting: Primary Care

## 2024-05-07 ENCOUNTER — Observation Stay (HOSPITAL_COMMUNITY)
Admission: EM | Admit: 2024-05-07 | Discharge: 2024-05-09 | Disposition: A | Discharge status: Home / Self Care | Attending: Emergency Medicine | Admitting: Emergency Medicine

## 2024-05-07 ENCOUNTER — Other Ambulatory Visit: Payer: Self-pay

## 2024-05-07 ENCOUNTER — Encounter (HOSPITAL_COMMUNITY): Payer: Self-pay

## 2024-05-07 DIAGNOSIS — J449 Chronic obstructive pulmonary disease, unspecified: Secondary | ICD-10-CM | POA: Insufficient documentation

## 2024-05-07 DIAGNOSIS — E785 Hyperlipidemia, unspecified: Secondary | ICD-10-CM

## 2024-05-07 DIAGNOSIS — K269 Duodenal ulcer, unspecified as acute or chronic, without hemorrhage or perforation: Secondary | ICD-10-CM

## 2024-05-07 DIAGNOSIS — D72819 Decreased white blood cell count, unspecified: Secondary | ICD-10-CM | POA: Insufficient documentation

## 2024-05-07 DIAGNOSIS — D696 Thrombocytopenia, unspecified: Secondary | ICD-10-CM | POA: Insufficient documentation

## 2024-05-07 DIAGNOSIS — I4819 Other persistent atrial fibrillation: Secondary | ICD-10-CM | POA: Insufficient documentation

## 2024-05-07 DIAGNOSIS — R531 Weakness: Secondary | ICD-10-CM | POA: Insufficient documentation

## 2024-05-07 DIAGNOSIS — Z87891 Personal history of nicotine dependence: Secondary | ICD-10-CM | POA: Insufficient documentation

## 2024-05-07 DIAGNOSIS — I2510 Atherosclerotic heart disease of native coronary artery without angina pectoris: Secondary | ICD-10-CM | POA: Insufficient documentation

## 2024-05-07 DIAGNOSIS — Z79899 Other long term (current) drug therapy: Secondary | ICD-10-CM | POA: Insufficient documentation

## 2024-05-07 DIAGNOSIS — J101 Influenza due to other identified influenza virus with other respiratory manifestations: Principal | ICD-10-CM | POA: Insufficient documentation

## 2024-05-07 DIAGNOSIS — E119 Type 2 diabetes mellitus without complications: Secondary | ICD-10-CM | POA: Insufficient documentation

## 2024-05-07 DIAGNOSIS — R55 Syncope and collapse: Secondary | ICD-10-CM | POA: Insufficient documentation

## 2024-05-07 DIAGNOSIS — R197 Diarrhea, unspecified: Secondary | ICD-10-CM | POA: Insufficient documentation

## 2024-05-07 DIAGNOSIS — Z8711 Personal history of peptic ulcer disease: Secondary | ICD-10-CM | POA: Insufficient documentation

## 2024-05-07 DIAGNOSIS — Z7984 Long term (current) use of oral hypoglycemic drugs: Secondary | ICD-10-CM | POA: Insufficient documentation

## 2024-05-07 DIAGNOSIS — Z8719 Personal history of other diseases of the digestive system: Secondary | ICD-10-CM

## 2024-05-07 DIAGNOSIS — E1165 Type 2 diabetes mellitus with hyperglycemia: Secondary | ICD-10-CM | POA: Diagnosis present

## 2024-05-07 DIAGNOSIS — C61 Malignant neoplasm of prostate: Secondary | ICD-10-CM

## 2024-05-07 DIAGNOSIS — R5381 Other malaise: Secondary | ICD-10-CM | POA: Insufficient documentation

## 2024-05-07 DIAGNOSIS — F32A Depression, unspecified: Secondary | ICD-10-CM | POA: Diagnosis present

## 2024-05-07 DIAGNOSIS — I4891 Unspecified atrial fibrillation: Secondary | ICD-10-CM | POA: Insufficient documentation

## 2024-05-07 DIAGNOSIS — I10 Essential (primary) hypertension: Secondary | ICD-10-CM | POA: Insufficient documentation

## 2024-05-07 DIAGNOSIS — J189 Pneumonia, unspecified organism: Principal | ICD-10-CM | POA: Insufficient documentation

## 2024-05-07 LAB — COMPREHENSIVE METABOLIC PANEL WITH GFR
ALT: 33 U/L (ref 0–44)
AST: 44 U/L — ABNORMAL HIGH (ref 15–41)
Albumin: 4.5 g/dL (ref 3.5–5.0)
Alkaline Phosphatase: 75 U/L (ref 38–126)
Anion gap: 17 — ABNORMAL HIGH (ref 5–15)
BUN: 21 mg/dL (ref 8–23)
CO2: 19 mmol/L — ABNORMAL LOW (ref 22–32)
Calcium: 9.5 mg/dL (ref 8.9–10.3)
Chloride: 97 mmol/L — ABNORMAL LOW (ref 98–111)
Creatinine, Ser: 1.22 mg/dL (ref 0.61–1.24)
GFR, Estimated: >60 mL/min
Glucose, Bld: 90 mg/dL (ref 70–99)
Potassium: 4.5 mmol/L (ref 3.5–5.1)
Sodium: 133 mmol/L — ABNORMAL LOW (ref 135–145)
Total Bilirubin: 0.7 mg/dL (ref 0.0–1.2)
Total Protein: 7.2 g/dL (ref 6.5–8.1)

## 2024-05-07 LAB — CBG MONITORING, ED: Glucose-Capillary: 96 mg/dL (ref 70–99)

## 2024-05-07 NOTE — ED Triage Notes (Addendum)
 Pt BIB [facility] from home c/o weakness, lightheadedness, dizziness, and inability to ambulate. Pt states s/s started at 1000 today and progressively worsened throughout the day. Weakness, dizziness, and lightheadedness started this morning, and inability to ambulate started at 1800 this evening. Pt states he had similar episode several years ago.   Pt denies nausea, vomiting and diarrhea. Denies pain at this time. A&Ox4. Pt does have productive yellow sputum cough.  EMS states stroke screen negative for them.   EMS vitals  106/60  76 HR  18 RR  96% Sp02 room air   127 cbg

## 2024-05-08 ENCOUNTER — Emergency Department (HOSPITAL_COMMUNITY)

## 2024-05-08 ENCOUNTER — Ambulatory Visit: Admitting: Primary Care

## 2024-05-08 ENCOUNTER — Encounter (HOSPITAL_COMMUNITY): Payer: Self-pay | Admitting: Family Medicine

## 2024-05-08 DIAGNOSIS — Z8719 Personal history of other diseases of the digestive system: Secondary | ICD-10-CM | POA: Diagnosis not present

## 2024-05-08 DIAGNOSIS — I4891 Unspecified atrial fibrillation: Secondary | ICD-10-CM

## 2024-05-08 DIAGNOSIS — D696 Thrombocytopenia, unspecified: Secondary | ICD-10-CM | POA: Diagnosis present

## 2024-05-08 DIAGNOSIS — R531 Weakness: Secondary | ICD-10-CM

## 2024-05-08 DIAGNOSIS — I25119 Atherosclerotic heart disease of native coronary artery with unspecified angina pectoris: Secondary | ICD-10-CM

## 2024-05-08 DIAGNOSIS — E1165 Type 2 diabetes mellitus with hyperglycemia: Secondary | ICD-10-CM

## 2024-05-08 DIAGNOSIS — J101 Influenza due to other identified influenza virus with other respiratory manifestations: Secondary | ICD-10-CM | POA: Diagnosis present

## 2024-05-08 DIAGNOSIS — J439 Emphysema, unspecified: Secondary | ICD-10-CM | POA: Diagnosis not present

## 2024-05-08 LAB — URINALYSIS, ROUTINE W REFLEX MICROSCOPIC
Bacteria, UA: NONE SEEN
Bilirubin Urine: NEGATIVE
Glucose, UA: >=500 mg/dL — AB
Hgb urine dipstick: NEGATIVE
Ketones, ur: 20 mg/dL — AB
Leukocytes,Ua: NEGATIVE
Nitrite: NEGATIVE
Protein, ur: NEGATIVE mg/dL
Specific Gravity, Urine: 1.018 (ref 1.005–1.030)
pH: 5 (ref 5.0–8.0)

## 2024-05-08 LAB — CBC
HCT: 47.4 % (ref 39.0–52.0)
HCT: 43.6 % (ref 39.0–52.0)
Hemoglobin: 16.4 g/dL (ref 13.0–17.0)
Hemoglobin: 14.8 g/dL (ref 13.0–17.0)
MCH: 32.3 pg (ref 26.0–34.0)
MCH: 32.5 pg (ref 26.0–34.0)
MCHC: 34.6 g/dL (ref 30.0–36.0)
MCHC: 33.9 g/dL (ref 30.0–36.0)
MCV: 93.5 fL (ref 80.0–100.0)
MCV: 95.6 fL (ref 80.0–100.0)
Platelets: 86 K/uL — ABNORMAL LOW (ref 150–400)
Platelets: 75 K/uL — ABNORMAL LOW (ref 150–400)
RBC: 5.07 MIL/uL (ref 4.22–5.81)
RBC: 4.56 MIL/uL (ref 4.22–5.81)
RDW: 13.9 % (ref 11.5–15.5)
RDW: 13.9 % (ref 11.5–15.5)
WBC: 6.4 K/uL (ref 4.0–10.5)
WBC: 4.9 K/uL (ref 4.0–10.5)
nRBC: 0 % (ref 0.0–0.2)
nRBC: 0 % (ref 0.0–0.2)

## 2024-05-08 LAB — CK: Total CK: 113 U/L (ref 49–397)

## 2024-05-08 LAB — PROCALCITONIN: Procalcitonin: 0.87 ng/mL

## 2024-05-08 LAB — BASIC METABOLIC PANEL WITH GFR
Anion gap: 12 (ref 5–15)
BUN: 23 mg/dL (ref 8–23)
CO2: 22 mmol/L (ref 22–32)
Calcium: 9 mg/dL (ref 8.9–10.3)
Chloride: 103 mmol/L (ref 98–111)
Creatinine, Ser: 1.22 mg/dL (ref 0.61–1.24)
GFR, Estimated: >60 mL/min
Glucose, Bld: 74 mg/dL (ref 70–99)
Potassium: 4.1 mmol/L (ref 3.5–5.1)
Sodium: 137 mmol/L (ref 135–145)

## 2024-05-08 LAB — RESP PANEL BY RT-PCR (RSV, FLU A&B, COVID)  RVPGX2
Influenza A by PCR: POSITIVE — AB
Influenza B by PCR: NEGATIVE
Resp Syncytial Virus by PCR: NEGATIVE
SARS Coronavirus 2 by RT PCR: NEGATIVE

## 2024-05-08 LAB — GLUCOSE, CAPILLARY
Glucose-Capillary: 200 mg/dL — ABNORMAL HIGH (ref 70–99)
Glucose-Capillary: 125 mg/dL — ABNORMAL HIGH (ref 70–99)
Glucose-Capillary: 216 mg/dL — ABNORMAL HIGH (ref 70–99)

## 2024-05-08 LAB — C DIFFICILE QUICK SCREEN W PCR REFLEX
C Diff antigen: NEGATIVE
C Diff interpretation: NOT DETECTED
C Diff toxin: NEGATIVE

## 2024-05-08 LAB — CBG MONITORING, ED
Glucose-Capillary: 72 mg/dL (ref 70–99)
Glucose-Capillary: 237 mg/dL — ABNORMAL HIGH (ref 70–99)

## 2024-05-08 LAB — VITAMIN D 25 HYDROXY (VIT D DEFICIENCY, FRACTURES): Vit D, 25-Hydroxy: 39.5 ng/mL (ref 30–100)

## 2024-05-08 LAB — VITAMIN B12: Vitamin B-12: 590 pg/mL (ref 180–914)

## 2024-05-08 LAB — TSH: TSH: 1.53 u[IU]/mL (ref 0.350–4.500)

## 2024-05-08 MED ORDER — SODIUM CHLORIDE 0.9 % IV BOLUS
500.0000 mL | Freq: Once | INTRAVENOUS | Status: AC
  Administered 2024-05-08: 500 mL via INTRAVENOUS

## 2024-05-08 MED ORDER — ACETAMINOPHEN 650 MG RE SUPP: 650.0000 mg | Freq: Four times a day (QID) | RECTAL | Status: DC | PRN

## 2024-05-08 MED ORDER — SODIUM CHLORIDE 0.9 % IV SOLN
500.0000 mg | Freq: Once | INTRAVENOUS | Status: AC
  Administered 2024-05-08: 500 mg via INTRAVENOUS
  Filled 2024-05-08: qty 5

## 2024-05-08 MED ORDER — SODIUM CHLORIDE 0.9 % IV SOLN: INTRAVENOUS | Status: AC

## 2024-05-08 MED ORDER — SODIUM CHLORIDE 0.9 % IV SOLN
1.0000 g | Freq: Once | INTRAVENOUS | Status: AC
  Administered 2024-05-08: 1 g via INTRAVENOUS
  Filled 2024-05-08: qty 10

## 2024-05-08 MED ORDER — GUAIFENESIN 100 MG/5ML PO LIQD: 5.0000 mL | ORAL | Status: DC | PRN

## 2024-05-08 MED ORDER — INSULIN ASPART 100 UNIT/ML IJ SOLN
0.0000 [IU] | Freq: Every day | INTRAMUSCULAR | Status: DC
  Administered 2024-05-08: 2 [IU] via SUBCUTANEOUS
  Filled 2024-05-08: qty 2

## 2024-05-08 MED ORDER — TAMSULOSIN HCL 0.4 MG PO CAPS
0.4000 mg | ORAL_CAPSULE | Freq: Every day | ORAL | Status: DC
  Administered 2024-05-08 – 2024-05-09 (×2): 0.4 mg via ORAL
  Filled 2024-05-08 (×2): qty 1

## 2024-05-08 MED ORDER — ASPIRIN 81 MG PO TBEC
81.0000 mg | DELAYED_RELEASE_TABLET | Freq: Every day | ORAL | Status: DC
  Administered 2024-05-08 – 2024-05-09 (×2): 81 mg via ORAL
  Filled 2024-05-08 (×2): qty 1

## 2024-05-08 MED ORDER — IPRATROPIUM-ALBUTEROL 0.5-2.5 (3) MG/3ML IN SOLN: 3.0000 mL | Freq: Four times a day (QID) | RESPIRATORY_TRACT | Status: DC | PRN

## 2024-05-08 MED ORDER — INSULIN ASPART 100 UNIT/ML IJ SOLN
0.0000 [IU] | Freq: Three times a day (TID) | INTRAMUSCULAR | Status: DC
  Filled 2024-05-08: qty 2

## 2024-05-08 MED ORDER — SENNOSIDES-DOCUSATE SODIUM 8.6-50 MG PO TABS: 1.0000 | ORAL_TABLET | Freq: Every evening | ORAL | Status: DC | PRN

## 2024-05-08 MED ORDER — PROCHLORPERAZINE EDISYLATE 10 MG/2ML IJ SOLN: 5.0000 mg | Freq: Four times a day (QID) | INTRAMUSCULAR | Status: DC | PRN

## 2024-05-08 MED ORDER — ISOSORBIDE MONONITRATE ER 60 MG PO TB24
60.0000 mg | ORAL_TABLET | Freq: Every day | ORAL | Status: DC
  Administered 2024-05-08 – 2024-05-09 (×2): 60 mg via ORAL
  Filled 2024-05-08: qty 2
  Filled 2024-05-08: qty 1

## 2024-05-08 MED ORDER — OSELTAMIVIR PHOSPHATE 75 MG PO CAPS
75.0000 mg | ORAL_CAPSULE | Freq: Two times a day (BID) | ORAL | Status: DC
  Administered 2024-05-08 – 2024-05-09 (×3): 75 mg via ORAL
  Filled 2024-05-08 (×4): qty 1

## 2024-05-08 MED ORDER — ACETAMINOPHEN 325 MG PO TABS: 650.0000 mg | ORAL_TABLET | Freq: Four times a day (QID) | ORAL | Status: DC | PRN

## 2024-05-08 MED ORDER — OXYCODONE HCL 5 MG PO TABS: 2.5000 mg | ORAL_TABLET | ORAL | Status: DC | PRN

## 2024-05-08 MED ORDER — PANTOPRAZOLE SODIUM 40 MG PO TBEC
40.0000 mg | DELAYED_RELEASE_TABLET | Freq: Every day | ORAL | Status: DC
  Administered 2024-05-08 – 2024-05-09 (×2): 40 mg via ORAL
  Filled 2024-05-08 (×2): qty 1

## 2024-05-08 MED ORDER — ROSUVASTATIN CALCIUM 20 MG PO TABS
20.0000 mg | ORAL_TABLET | Freq: Every day | ORAL | Status: DC
  Administered 2024-05-08 – 2024-05-09 (×2): 20 mg via ORAL
  Filled 2024-05-08 (×2): qty 1

## 2024-05-08 NOTE — ED Notes (Signed)
 Patient transported to CT

## 2024-05-08 NOTE — Progress Notes (Signed)
 Patient seen and examined personally, I reviewed the chart, history and physical and admission note, done by admitting physician this morning and agree with the same with following addendum.  Please refer to the morning admission note for more detailed plan of care.  Briefly,  [name] Cook is a 74 y.o. male with PMH of  hypertension, type 2 diabetes mellitus, CAD, atrial fibrillation status post ablation and Watchman device placement, history of PE no longer anticoagulated, CHB s/p PPM, PUD, chronic thrombocytopenia, depression, anxiety, COPD, and OSA with CPAP intolerance who presents with cough, severe generalized weakness, fatigue, for 1 day. In the ZI:jqzampoz vitals fairly stable tested positive for influenza, plat 86,000, CT head and chest x-ray-no acute finding.  Blood cultures were collected given IV fluids IV antibiotic and admitted   Subjective: Seen and examined today in the ED Still complains of generalized weakness but no chest pain nausea vomiting. Overnight on room air, afebrile, VSS-although soft and 90/59 x 1 but mostly in 110, Labs-unremarkable BMP, CBC with thrombocytopenia 86>75 Pro-Cal 0.8 CK1 1 3 TSH 1.5  Assessment and plan:  Acute influenza A infection Generalized weakness/deconditioning: Patient with 1 day of severe fatigue lightheadedness, cough, chest x-ray unremarkable, no fever.  CK and TSH normal, Pro-Cal reassuring,blood culture sent in the ED will follow-up, hold off on antibiotics, continue Tamiflu , supportive care PT OT. Keep on fall precaution. ordered B12 vitamin D    Acute on chronic thrombocytopenia: previous baseline variable ~90 Septembe 2024, 111-117 in December, acute drop in the setting of influenza.  Monitor avoid anticoagulation  CAD without chest pain-on aspirin  Crestor  Imdur  Atrial fibrillation s/p ablation and Watchman procedure not on OAC Type 2 diabetes with stable A1c and blood sugar COPD not exacerbation-continue as needed  bronchodilators History of PVD: On PPI  Mobility: PT Orders: Active PT Follow up Rec:     DVT prophylaxis: SCDs Start: 05/08/24 9666 Code Status:   Code Status: Full Code Family Communication: plan of care discussed with patient at bedside. Patient status is: Remains hospitalized because of severity of illness Level of care: Med-Surg   Dispo: The patient is from: home with girlfriend and her mother            Anticipated disposition: TBD Objective: Vitals last 24 hrs: Vitals:   05/08/24 0200 05/08/24 0250 05/08/24 0630 05/08/24 1022  BP: 111/70  119/66 (!) 113/59  Pulse: 72  70 68  Resp: 18  18 17   Temp:  98.3 F (36.8 C) 97.9 F (36.6 C) 98.2 F (36.8 C)  TempSrc:  Oral Oral   SpO2: 93%  98% 95%  Weight:      Height:        Physical Examination: General exam: alert awake, oriented, older than stated age HEENT:Oral mucosa moist, Ear/Nose WNL grossly Respiratory system: Bilaterally clear BS,no use of accessory muscle Cardiovascular system: S1 & S2 +, No JVD. Gastrointestinal system: Abdomen soft,NT,ND, BS+ Nervous System: Alert, awake, moving all extremities,and following commands. Extremities: extremities warm, leg edema neg Skin: Warm, no rashes MSK: Normal muscle bulk,tone, power with generalized weakness  Medications reviewed:  Scheduled Meds:  aspirin  EC  81 mg Oral Daily   insulin  aspart  0-5 Units Subcutaneous QHS   insulin  aspart  0-6 Units Subcutaneous TID WC   isosorbide  mononitrate  60 mg Oral Daily   oseltamivir   75 mg Oral BID   pantoprazole   40 mg Oral Daily   rosuvastatin   20 mg Oral Daily   tamsulosin   0.4 mg Oral  Daily   Continuous Infusions:  sodium chloride  100 mL/hr at 05/08/24 9470   Diet: Diet Order             Diet regular Fluid consistency: Thin  Diet effective now

## 2024-05-08 NOTE — Evaluation (Signed)
 Physical Therapy Evaluation Patient Details Name: [name] Cook MRN: [id number] DOB: 08/03/1950 Today's Date: 05/08/2024  History of Present Illness  75 y.o. male presents to [facility]/07/24 with generalized weakness, fatigue, and cough. Admitted with FluA+. PMHx:  hypertension, type 2 diabetes mellitus, CAD, atrial fibrillation status post ablation and Watchman device placement, history of PE no longer anticoagulated, CHB s/p PPM, PUD, chronic thrombocytopenia, depression, anxiety, COPD, and OSA with CPAP intolerance   Clinical Impression  PTA pt was independent for mobility with no AD. Pt presents with generalized weakness, fatigue, decreased activity tolerance, and impaired balance. Pt required supervision for bed mobility and MinA to stand with use of RW. Able to then ambulate in the room with CGA for safety. Pt has 24/7 physical assist available upon d/c home. Discussed recommendation for return home with pt stating his girlfriend can provide physical assistance. Pt would benefit from initial HHPT with progression to OP PT. Acute PT to follow.         If plan is discharge home, recommend the following: A lot of help with walking and/or transfers;A little help with bathing/dressing/bathroom;Assistance with cooking/housework;Assist for transportation;Help with stairs or ramp for entrance   Can travel by private vehicle    Yes    Equipment Recommendations BSC/3in1     Functional Status Assessment Patient has had a recent decline in their functional status and demonstrates the ability to make significant improvements in function in a reasonable and predictable amount of time.     Precautions / Restrictions Precautions Precautions: Fall Recall of Precautions/Restrictions: Intact Restrictions Weight Bearing Restrictions Per Provider Order: No      Mobility  Bed Mobility Overal bed mobility: Needs Assistance Bed Mobility: Supine to Sit, Sit to Supine    Supine to sit: Supervision, HOB  elevated Sit to supine: Supervision, HOB elevated   General bed mobility comments: for safety    Transfers Overall transfer level: Needs assistance Equipment used: Rolling walker (2 wheels) Transfers: Sit to/from Stand Sit to Stand: Min assist    General transfer comment: MinA for boost-up from EOB. Cueing for hand placement with RW    Ambulation/Gait Ambulation/Gait assistance: Contact guard assist Gait Distance (Feet): 30 Feet Assistive device: Rolling walker (2 wheels) Gait Pattern/deviations: Step-through pattern, Decreased stride length Gait velocity: decr     General Gait Details: heavy reliance on BUE support with BLE instability. Distance limited by fatigue    Balance Overall balance assessment: Needs assistance, Mild deficits observed, not formally tested Sitting-balance support: No upper extremity supported, Feet supported Sitting balance-Leahy Scale: Fair    Standing balance support: Bilateral upper extremity supported, During functional activity, Reliant on assistive device for balance Standing balance-Leahy Scale: Poor Standing balance comment: reliant on BUE and external support        Pertinent Vitals/Pain Pain Assessment Pain Assessment: No/denies pain    Home Living Family/patient expects to be discharged to:: Private residence Living Arrangements: Spouse/significant other;Parent (girlfriend and her mother) Available Help at Discharge: Family;Available 24 hours/day Type of Home: Mobile home Home Access: Stairs to enter Entrance Stairs-Rails: Right;Left;Can reach both Entrance Stairs-Number of Steps: 6   Home Layout: One level Home Equipment: Agricultural Consultant (2 wheels);Cane - single point      Prior Function Prior Level of Function : Independent/Modified Independent;Driving    Mobility Comments: Ind with no AD ADLs Comments: Ind     Extremity/Trunk Assessment   Upper Extremity Assessment Upper Extremity Assessment: Defer to OT evaluation     Lower Extremity Assessment  Lower Extremity Assessment: Generalized weakness    Cervical / Trunk Assessment Cervical / Trunk Assessment: Normal  Communication   Communication Communication: No apparent difficulties    Cognition Arousal: Alert Behavior During Therapy: WFL for tasks assessed/performed   PT - Cognitive impairments: No apparent impairments      Following commands: Intact       Cueing Cueing Techniques: Verbal cues     General Comments General comments (skin integrity, edema, etc.): VSS on RA     PT Assessment Patient needs continued PT services  PT Problem List Decreased strength;Decreased activity tolerance;Decreased balance;Decreased mobility       PT Treatment Interventions DME instruction;Gait training;Functional mobility training;Stair training;Therapeutic activities;Therapeutic exercise;Balance training;Neuromuscular re-education;Patient/family education    PT Goals (Current goals can be found in the Care Plan section)  Acute Rehab PT Goals Patient Stated Goal: to feel better PT Goal Formulation: With patient Time For Goal Achievement: 05/22/24 Potential to Achieve Goals: Good    Frequency Min 2X/week        AM-PAC PT 6 Clicks Mobility  Outcome Measure Help needed turning from your back to your side while in a flat bed without using bedrails?: None Help needed moving from lying on your back to sitting on the side of a flat bed without using bedrails?: A Little Help needed moving to and from a bed to a chair (including a wheelchair)?: A Little Help needed standing up from a chair using your arms (e.g., wheelchair or bedside chair)?: A Little Help needed to walk in hospital room?: A Little Help needed climbing 3-5 steps with a railing? : A Lot 6 Click Score: 18    End of Session Equipment Utilized During Treatment: Gait belt Activity Tolerance: Patient tolerated treatment well;Patient limited by fatigue Patient left: in bed;with call  bell/phone within reach Nurse Communication: Mobility status PT Visit Diagnosis: Other abnormalities of gait and mobility (R26.89);Unsteadiness on feet (R26.81);Muscle weakness (generalized) (M62.81)    Time: 8669-8650 PT Time Calculation (min) (ACUTE ONLY): 19 min   Charges:   PT Evaluation $PT Eval Low Complexity: 1 Low   PT General Charges $$ ACUTE PT VISIT: 1 Visit       Kate ORN, PT, DPT Secure Chat Preferred  Rehab Office [phone]   Kate BRAVO Wendolyn 05/08/2024, 2:43 PM

## 2024-05-08 NOTE — Progress Notes (Signed)
 Transition of Care [facility]) - Inpatient Brief Assessment   Patient Details  Name: CHANEY MACLAREN MRN: [id number] Date of Birth: 1950-06-12  [facility]) CM/SW Contact:    Rosaline JONELLE Joe, RN Phone Number: 05/08/2024, 4:28 PM   Clinical Narrative: Patient admitted to the hospital from home with Influenza.  Patient lives with significant other at the home and is normally independent.  Patient remains on RA.  Patient has RW and Cane at home.  Patient was offered home health services and bedside commode after patient was evaluated by PT and patient declined both HH and 3:1.  Patient plans to have significant other provide transportation to home when stable.  No other needs from IP Care management.  Patient plans to call PCP and schedule hospital follow up in the next week.   Transition of Care Asessment: Insurance and Status: (P) Insurance coverage has been reviewed Patient has primary care physician: (P) Yes Home environment has been reviewed: (P) from home with significant other Prior level of function:: (P) self Prior/Current Home Services: (P) No current home services Social Drivers of Health Review: (P) SDOH reviewed interventions complete Readmission risk has been reviewed: (P) Yes Transition of care needs: (P) no transition of care needs at this time

## 2024-05-08 NOTE — ED Notes (Signed)
 2W notified of patient coming upstairs now.

## 2024-05-08 NOTE — ED Provider Notes (Signed)
 " [facility] AT [facility] Provider Note   CSN: [id number] Arrival date & time: 05/07/24  2145     Patient presents with: Weakness   [name] Cook is a 74 y.o. male.   The history is provided by the patient.  Weakness Severity:  Severe Onset quality:  Gradual Duration:  1 day Timing:  Constant Progression:  Worsening Chronicity:  New Context: not alcohol use   Relieved by:  Nothing Worsened by:  Nothing Ineffective treatments:  None tried Associated symptoms: cough and falls   Associated symptoms: no abdominal pain, no fever and no vomiting   Risk factors: no anemia        Prior to Admission medications  Medication Sig Start Date End Date Taking? Authorizing Provider  albuterol  (VENTOLIN  HFA) 108 (90 Base) MCG/ACT inhaler INHALE 2 PUFFS BY MOUTH EVERY 4 HOURS AS NEEDED FOR WHEEZE OR FOR SHORTNESS OF BREATH 01/02/24   Clark, Katherine K, NP  Ascorbic Acid (VITAMIN C) 1000 MG tablet Take 2,000 mg by mouth daily.    [provider]  aspirin  EC 81 MG tablet Take 1 tablet (81 mg total) by mouth daily. Swallow whole. 12/27/22   Cheryle Page, MD  blood glucose meter kit and supplies KIT Dispense based on patient and insurance preference. Use up to four times daily as directed. (FOR ICD-9 250.00, 250.01). 04/08/21   Clark, Katherine K, NP  budesonide -formoterol  (SYMBICORT ) 160-4.5 MCG/ACT inhaler Inhale 2 puffs into the lungs 2 (two) times daily as needed.    [provider]  clopidogrel  (PLAVIX ) 75 MG tablet Take 1 tablet (75 mg total) by mouth daily. 05/14/23   Jeffrie Oneil BROCKS, MD  empagliflozin  (JARDIANCE ) 25 MG TABS tablet TAKE 1 TABLET (25 MG TOTAL) BY MOUTH DAILY BEFORE BREAKFAST. FOR DIABETES. 01/02/24   Clark, Katherine K, NP  fluticasone  (FLONASE ) 50 MCG/ACT nasal spray PLACE 1 SPRAY INTO BOTH NOSTRILS DAILY AS NEEDED FOR ALLERGIES OR RHINITIS. 01/02/24   Clark, Katherine K, NP  gabapentin  (NEURONTIN ) 300 MG capsule TAKE 1 CAPSULE  BY MOUTH EVERY MORNING AND 2 CAPSULES BY MOUTH AT NIGHT FOR NEUROPATHY 12/22/23   Clark, Katherine K, NP  glipiZIDE  (GLUCOTROL  XL) 5 MG 24 hr tablet TAKE 1 TABLET BY MOUTH DAILY WITH BREAKFAST FOR DIABETES 01/02/24   Clark, Katherine K, NP  glucose blood (ACCU-CHEK GUIDE) test strip TEST FOUR TIMES A DAY 09/04/22   Gretta Comer POUR, NP  isosorbide  mononitrate (IMDUR ) 60 MG 24 hr tablet TAKE 1 TABLET BY MOUTH EVERY DAY 12/03/23   Thompson, Kathryn R, PA-C  nitroGLYCERIN  (NITROSTAT ) 0.4 MG SL tablet Place 1 tablet (0.4 mg total) under the tongue every 5 (five) minutes as needed for chest pain. Do not exceed 3 tablets in 24 hours. 10/30/23   Clark, Katherine K, NP  pantoprazole  (PROTONIX ) 40 MG tablet TAKE 1 TABLET (40 MG TOTAL) BY MOUTH DAILY. FOR ULCER 01/02/24   Clark, Katherine K, NP  primidone  (MYSOLINE ) 50 MG tablet Take 1 tablet (50 mg total) by mouth at bedtime. For tremors 04/08/24   Clark, Katherine K, NP  rosuvastatin  (CRESTOR ) 20 MG tablet TAKE 1 TABLET BY MOUTH EVERY DAY IN THE EVENING FOR CHOLESTEROL 10/30/23   Clark, Katherine K, NP  sertraline  (ZOLOFT ) 100 MG tablet TAKE 1.5 TABLETS (150 MG TOTAL) BY MOUTH DAILY. FOR ANXIETY AND DEPRESSION. 01/02/24   Clark, Katherine K, NP  sildenafil  (VIAGRA ) 25 MG tablet Take 2 to 3 tablets as needed 11/21/23   Waddell Lusher  W, MD  tamsulosin  (FLOMAX ) 0.4 MG CAPS capsule TAKE 1 CAPSULE BY MOUTH EVERY DAY 11/21/23   McGowan, Clotilda A, PA-C  TYLENOL  500 MG tablet Take 1,000 mg by mouth daily.    [provider]    Allergies: Januvia  [sitagliptin ] and Metformin  and related    Review of Systems  Constitutional:  Negative for fever.  HENT:  Negative for ear discharge.   Respiratory:  Positive for cough.   Gastrointestinal:  Negative for abdominal pain and vomiting.  Musculoskeletal:  Positive for falls.  Neurological:  Positive for weakness.  All other systems reviewed and are negative.   Updated Vital Signs BP 111/70   Pulse 72   Temp 98.9 F  (37.2 C) (Oral)   Resp 18   Ht 5' 11 (1.803 m)   Wt 90.7 kg   SpO2 93%   BMI 27.89 kg/m   Physical Exam Vitals and nursing note reviewed.  Constitutional:      General: He is not in acute distress.    Appearance: Normal appearance. He is well-developed. He is not diaphoretic.  HENT:     Head: Normocephalic and atraumatic.     Nose: Nose normal.  Eyes:     Conjunctiva/sclera: Conjunctivae normal.     Pupils: Pupils are equal, round, and reactive to light.  Cardiovascular:     Rate and Rhythm: Normal rate and regular rhythm.     Pulses: Normal pulses.     Heart sounds: Normal heart sounds.  Pulmonary:     Effort: Pulmonary effort is normal.     Breath sounds: Rhonchi and rales present. No wheezing.  Abdominal:     General: Bowel sounds are normal.     Palpations: Abdomen is soft.     Tenderness: There is no abdominal tenderness. There is no guarding or rebound.  Musculoskeletal:        General: Normal range of motion.     Cervical back: Normal range of motion and neck supple.  Skin:    General: Skin is warm and dry.     Capillary Refill: Capillary refill takes less than 2 seconds.  Neurological:     General: No focal deficit present.     Mental Status: He is alert and oriented to person, place, and time.  Psychiatric:        Mood and Affect: Mood normal.     (all labs ordered are listed, but only abnormal results are displayed) Results for orders placed or performed during the hospital encounter of 05/07/24  CBG monitoring, ED   Collection Time: 05/07/24 10:34 PM  Result Value Ref Range   Glucose-Capillary 96 70 - 99 mg/dL  Comprehensive metabolic panel   Collection Time: 05/07/24 10:50 PM  Result Value Ref Range   Sodium 133 (L) 135 - 145 mmol/L   Potassium 4.5 3.5 - 5.1 mmol/L   Chloride 97 (L) 98 - 111 mmol/L   CO2 19 (L) 22 - 32 mmol/L   Glucose, Bld 90 70 - 99 mg/dL   BUN 21 8 - 23 mg/dL   Creatinine, Ser 8.77 0.61 - 1.24 mg/dL   Calcium  9.5 8.9 - 10.3  mg/dL   Total Protein 7.2 6.5 - 8.1 g/dL   Albumin  4.5 3.5 - 5.0 g/dL   AST 44 (H) 15 - 41 U/L   ALT 33 0 - 44 U/L   Alkaline Phosphatase 75 38 - 126 U/L   Total Bilirubin 0.7 0.0 - 1.2 mg/dL   GFR, Estimated >39 >  60 mL/min   Anion gap 17 (H) 5 - 15  CBC   Collection Time: 05/07/24 10:50 PM  Result Value Ref Range   WBC 6.4 4.0 - 10.5 K/uL   RBC 5.07 4.22 - 5.81 MIL/uL   Hemoglobin 16.4 13.0 - 17.0 g/dL   HCT 52.5 60.9 - 47.9 %   MCV 93.5 80.0 - 100.0 fL   MCH 32.3 26.0 - 34.0 pg   MCHC 34.6 30.0 - 36.0 g/dL   RDW 86.0 88.4 - 84.4 %   Platelets 86 (L) 150 - 400 K/uL   nRBC 0.0 0.0 - 0.2 %  Resp panel by RT-PCR (RSV, Flu A&B, Covid) Urine, In & Out Cath   Collection Time: 05/08/24  1:10 AM   Specimen: Urine, In & Out Cath; Nasal Swab  Result Value Ref Range   SARS Coronavirus 2 by RT PCR NEGATIVE NEGATIVE   Influenza A by PCR POSITIVE (A) NEGATIVE   Influenza B by PCR NEGATIVE NEGATIVE   Resp Syncytial Virus by PCR NEGATIVE NEGATIVE  Urinalysis, Routine w reflex microscopic -Urine, Clean Catch   Collection Time: 05/08/24  1:10 AM  Result Value Ref Range   Color, Urine YELLOW YELLOW   APPearance CLEAR CLEAR   Specific Gravity, Urine 1.018 1.005 - 1.030   pH 5.0 5.0 - 8.0   Glucose, UA >=500 (A) NEGATIVE mg/dL   Hgb urine dipstick NEGATIVE NEGATIVE   Bilirubin Urine NEGATIVE NEGATIVE   Ketones, ur 20 (A) NEGATIVE mg/dL   Protein, ur NEGATIVE NEGATIVE mg/dL   Nitrite NEGATIVE NEGATIVE   Leukocytes,Ua NEGATIVE NEGATIVE   RBC / HPF 0-5 0 - 5 RBC/hpf   WBC, UA 0-5 0 - 5 WBC/hpf   Bacteria, UA NONE SEEN NONE SEEN   Squamous Epithelial / HPF 0-5 0 - 5 /HPF   Mucus PRESENT    *Note: Due to a large number of results and/or encounters for the requested time period, some results have not been displayed. A complete set of results can be found in Results Review.   DG Chest Portable 1 View Result Date: 05/08/2024 EXAM: 1 VIEW(S) XRAY OF THE CHEST 05/08/2024 01:01:00 AM  COMPARISON: 02/25/2024 CLINICAL HISTORY: cough FINDINGS: LUNGS AND PLEURA: No focal pulmonary opacity. No pleural effusion. No pneumothorax. HEART AND MEDIASTINUM: Atherosclerotic calcifications. Left atrial appendage occlusion device in place. The leads from the left chest wall dual lead cardiac pacing device extend into the heart. BONES AND SOFT TISSUES: Left chest wall dual lead cardiac pacing device in place. No acute osseous abnormality. IMPRESSION: 1. No acute cardiopulmonary abnormality. Electronically signed by: Pinkie Pebbles MD 05/08/2024 01:05 AM EST RP Workstation: [id number]   CT Head Wo Contrast Result Date: 05/08/2024 EXAM: CT HEAD WITHOUT CONTRAST 05/08/2024 12:47:49 AM TECHNIQUE: CT of the head was performed without the administration of intravenous contrast. Automated exposure control, iterative reconstruction, and/or weight based adjustment of the mA/kV was utilized to reduce the radiation dose to as low as reasonably achievable. COMPARISON: CT head 03/21/2024 CLINICAL HISTORY: Syncope/presyncope, cerebrovascular cause suspected FINDINGS: BRAIN AND VENTRICLES: No acute hemorrhage. No evidence of acute infarct. No hydrocephalus. No extra-axial collection. No mass effect or midline shift. ORBITS: No acute abnormality. SINUSES: No acute abnormality. SOFT TISSUES AND SKULL: No acute soft tissue abnormality. No skull fracture. IMPRESSION: 1. No acute intracranial abnormality. Electronically signed by: Glendia Molt MD 05/08/2024 12:51 AM EST RP Workstation: [id number]     Radiology: DG Chest Portable 1 View Result Date: 05/08/2024 EXAM: 1 VIEW(S) XRAY OF  THE CHEST 05/08/2024 01:01:00 AM COMPARISON: 02/25/2024 CLINICAL HISTORY: cough FINDINGS: LUNGS AND PLEURA: No focal pulmonary opacity. No pleural effusion. No pneumothorax. HEART AND MEDIASTINUM: Atherosclerotic calcifications. Left atrial appendage occlusion device in place. The leads from the left chest wall dual lead cardiac pacing device  extend into the heart. BONES AND SOFT TISSUES: Left chest wall dual lead cardiac pacing device in place. No acute osseous abnormality. IMPRESSION: 1. No acute cardiopulmonary abnormality. Electronically signed by: Pinkie Pebbles MD 05/08/2024 01:05 AM EST RP Workstation: [id number]   CT Head Wo Contrast Result Date: 05/08/2024 EXAM: CT HEAD WITHOUT CONTRAST 05/08/2024 12:47:49 AM TECHNIQUE: CT of the head was performed without the administration of intravenous contrast. Automated exposure control, iterative reconstruction, and/or weight based adjustment of the mA/kV was utilized to reduce the radiation dose to as low as reasonably achievable. COMPARISON: CT head 03/21/2024 CLINICAL HISTORY: Syncope/presyncope, cerebrovascular cause suspected FINDINGS: BRAIN AND VENTRICLES: No acute hemorrhage. No evidence of acute infarct. No hydrocephalus. No extra-axial collection. No mass effect or midline shift. ORBITS: No acute abnormality. SINUSES: No acute abnormality. SOFT TISSUES AND SKULL: No acute soft tissue abnormality. No skull fracture. IMPRESSION: 1. No acute intracranial abnormality. Electronically signed by: Glendia Molt MD 05/08/2024 12:51 AM EST RP Workstation: [id number]     Procedures   Medications Ordered in the ED  cefTRIAXone  (ROCEPHIN ) 1 g in sodium chloride  0.9 % 100 mL IVPB (has no administration in time range)  azithromycin  (ZITHROMAX ) 500 mg in sodium chloride  0.9 % 250 mL IVPB (has no administration in time range)  sodium chloride  0.9 % bolus 500 mL (500 mLs Intravenous New Bag/Given 05/08/24 0228)                                    Medical Decision Making Patient with global weakness, falls and cough   Amount and/or Complexity of Data Reviewed Independent Historian: EMS    Details: See above  External Data Reviewed: notes.    Details: Previous notes reviewed  Labs: ordered.    Details: Flu A is positive.  White count is normal 6.4, hemoglobin is normal 16.4, low platelets  86, urine is negative for UTI sodium low 133, normal potassium 4.5, normal creatinine 1.22  Radiology: ordered.  Risk Decision regarding hospitalization.    Final diagnoses:  None   The patient appears reasonably stabilized for admission considering the current resources, flow, and capabilities available in the ED at this time, and I doubt any other [facility] requiring further screening and/or treatment in the ED prior to admission.  ED Discharge Orders     None          Alaina Donati, MD 05/08/24 0405  "

## 2024-05-08 NOTE — Hospital Course (Addendum)
 Patient seen and examined personally, I reviewed the chart, history and physical and admission note, done by admitting physician this morning and agree with the same with following addendum.  Please refer to the morning admission note for more detailed plan of care.  Briefly,  [name] Cook is a 74 y.o. male with PMH of  hypertension, type 2 diabetes mellitus, CAD, atrial fibrillation status post ablation and Watchman device placement, history of PE no longer anticoagulated, CHB s/p PPM, PUD, chronic thrombocytopenia, depression, anxiety, COPD, and OSA with CPAP intolerance who presents with cough, severe generalized weakness, fatigue, for 1 day. In the ZI:jqzampoz vitals fairly stable tested positive for influenza, plat 86,000, CT head and chest x-ray-no acute finding.  Blood cultures were collected given IV fluids IV antibiotic and admitted   Subjective: Seen and examined today in the ED Still complains of generalized weakness but no chest pain nausea vomiting. Overnight on room air, afebrile, VSS-although soft and 90/59 x 1 but mostly in 110, Labs-unremarkable BMP, CBC with thrombocytopenia 86>75 Pro-Cal 0.8 CK1 1 3 TSH 1.5  Assessment and plan:  Acute influenza A infection Generalized weakness/deconditioning: Patient with 1 day of severe fatigue lightheadedness, cough, chest x-ray unremarkable, no fever.  CK and TSH normal, Pro-Cal reassuring,blood culture sent in the ED will follow-up, hold off on antibiotics, continue Tamiflu , supportive care PT OT. Keep on fall precaution. ordered B12 vitamin D    Acute on chronic thrombocytopenia: previous baseline variable ~90 Septembe 2024, 111-117 in December, acute drop in the setting of influenza.  Monitor avoid anticoagulation  CAD without chest pain-on aspirin  Crestor  Imdur  Atrial fibrillation s/p ablation and Watchman procedure not on OAC Type 2 diabetes with stable A1c and blood sugar COPD not exacerbation-continue as needed  bronchodilators History of PVD: On PPI  Mobility: PT Orders: Active PT Follow up Rec:     DVT prophylaxis: SCDs Start: 05/08/24 9666 Code Status:   Code Status: Full Code Family Communication: plan of care discussed with patient at bedside. Patient status is: Remains hospitalized because of severity of illness Level of care: Med-Surg   Dispo: The patient is from: home with girlfriend and her mother            Anticipated disposition: TBD Objective: Vitals last 24 hrs: Vitals:   05/08/24 0200 05/08/24 0250 05/08/24 0630 05/08/24 1022  BP: 111/70  119/66 (!) 113/59  Pulse: 72  70 68  Resp: 18  18 17   Temp:  98.3 F (36.8 C) 97.9 F (36.6 C) 98.2 F (36.8 C)  TempSrc:  Oral Oral   SpO2: 93%  98% 95%  Weight:      Height:        Physical Examination: General exam: alert awake, oriented, older than stated age HEENT:Oral mucosa moist, Ear/Nose WNL grossly Respiratory system: Bilaterally clear BS,no use of accessory muscle Cardiovascular system: S1 & S2 +, No JVD. Gastrointestinal system: Abdomen soft,NT,ND, BS+ Nervous System: Alert, awake, moving all extremities,and following commands. Extremities: extremities warm, leg edema neg Skin: Warm, no rashes MSK: Normal muscle bulk,tone, power with generalized weakness  Medications reviewed:  Scheduled Meds:  aspirin  EC  81 mg Oral Daily   insulin  aspart  0-5 Units Subcutaneous QHS   insulin  aspart  0-6 Units Subcutaneous TID WC   isosorbide  mononitrate  60 mg Oral Daily   oseltamivir   75 mg Oral BID   pantoprazole   40 mg Oral Daily   rosuvastatin   20 mg Oral Daily   tamsulosin   0.4 mg Oral

## 2024-05-08 NOTE — H&P (Signed)
 " History and Physical    [name] Cook FMW:[id number] DOB: [birth year] DOA: 05/07/2024  PCP: Gretta Comer POUR, NP   Patient coming from: Home   Chief Complaint: Weakness, fatigue, cough   HPI: [name] Cook is a 74 y.o. male with medical history significant for hypertension, type 2 diabetes mellitus, CAD, atrial fibrillation status post ablation and Watchman device placement, history of PE no longer anticoagulated, CHB s/p PPM, PUD, chronic thrombocytopenia, depression, anxiety, COPD, and OSA with CPAP intolerance who presents with severe generalized weakness, fatigue, and cough.  Patient reports that he was in his usual state of health until yesterday morning when he began to feel generally poor.  He has developed severe generalized weakness and fatigue since then, has had a cough associated with this, but denies shortness of breath, chest pain, fevers, or chills.  He denies abdominal pain, vomiting, or diarrhea.  ED Course: Upon arrival to the ED, patient is found to be afebrile and saturating mid 90s on room air with normal RR, normal HR, and stable BP.  Labs are most notable for normal WBC, platelets 86,000, and positive influenza A PCR.  There are no acute findings on head CT or chest x-ray.  Blood cultures were collected in the ED and the patient was given 500 mL of NS, Rocephin , and azithromycin .  Review of Systems:  All other systems reviewed and apart from HPI, are negative.  Past Medical History:  Diagnosis Date   Acute bronchitis with COPD (HCC) 03/07/2021   Acute pulmonary embolism (HCC) 03/08/2022   Arthritis    knees and lower back (03/14/2013)   Atrial flutter (HCC)    radiofrequency ablation in 2001   CAD (coronary artery disease)    a. Nonobstructive. Cardiac cath in 2001-50% mid RI, normal LM, LAD, RCA b. cath 10/16/2014 95% mid RCA treated with DES, 99% ost D1 medical management due to small aneurysmal segment   Chronic anticoagulation    chronic Coumadin   anticoagulation   Chronic obstructive pulmonary disease (HCC) 04/20/2011   Diabetes mellitus, type 2 (HCC)    Diaphoresis 10/26/2021   Elevated lipase 06/23/2021   GERD (gastroesophageal reflux disease)    GIB (gastrointestinal bleeding) 03/08/2022   Hyperlipidemia    Hypertension    with hypertensive heart disease   Left knee pain 10/25/2017   medial   Obesity    Persistent atrial fibrillation (HCC)    recurrent atrial flutter since 2001 s/p DCCVs, multiple failed AADs, h/o tachy-mediated cardiomyopathy   Presence of Watchman left atrial appendage closure device 06/22/2022   Watchman  31mm FLX placed with Dr. Wonda   Pulmonary embolism (HCC) 03/09/2022   Shortness of breath    can come on at any time (03/14/2013)   Sleep apnea    dx'd; couldn't wear the mask (03/14/2013)   Symptomatic anemia 03/08/2022   Tobacco abuse    Upper GI bleed 12/20/2022   Urinary tract infection without hematuria 05/26/2022    Past Surgical History:  Procedure Laterality Date   ATRIAL FLUTTER ABLATION  2002   atrial flutter; subsequently developed atrial fibrillation   AV NODE ABLATION  01/24/2013   CARDIAC CATHETERIZATION  2002   CARDIAC CATHETERIZATION N/A 10/16/2014   Procedure: Left Heart Cath and Coronary Angiography;  Surgeon: Ozell Wonda, MD;  Location[facility] INVASIVE CV LAB;  Service: Cardiovascular;  Laterality: N/A;   CARDIOVERSION  05/31/2011   Procedure: CARDIOVERSION;  Surgeon: Lamar HERO. Juventino, MD;  Location: [facility] ORS;  Service: Cardiovascular;  Laterality: N/A;  CARPAL TUNNEL RELEASE Left 1980's   COLONOSCOPY WITH PROPOFOL  N/A 12/30/2018   Procedure: COLONOSCOPY WITH PROPOFOL ;  Surgeon: Therisa Bi, MD;  Location: [facility] ENDOSCOPY;  Service: Gastroenterology;  Laterality: N/A;   COLONOSCOPY WITH PROPOFOL  N/A 12/04/2019   Procedure: COLONOSCOPY WITH PROPOFOL ;  Surgeon: Therisa Bi, MD;  Location: [facility] ENDOSCOPY;  Service: Gastroenterology;  Laterality: N/A;   COLONOSCOPY WITH  PROPOFOL  N/A 06/13/2021   Procedure: COLONOSCOPY WITH PROPOFOL ;  Surgeon: Therisa Bi, MD;  Location: [facility] ENDOSCOPY;  Service: Gastroenterology;  Laterality: N/A;   ESOPHAGOGASTRODUODENOSCOPY N/A 03/01/2022   Procedure: ESOPHAGOGASTRODUODENOSCOPY (EGD);  Surgeon: Rosalie Kitchens, MD;  Location: [facility] ENDOSCOPY;  Service: Gastroenterology;  Laterality: N/A;   ESOPHAGOGASTRODUODENOSCOPY (EGD) WITH PROPOFOL  N/A 12/21/2022   Procedure: ESOPHAGOGASTRODUODENOSCOPY (EGD) WITH PROPOFOL ;  Surgeon: Leigh Elspeth SQUIBB, MD;  Location: [facility] ENDOSCOPY;  Service: Gastroenterology;  Laterality: N/A;   ESOPHAGOGASTRODUODENOSCOPY (EGD) WITH PROPOFOL  N/A 12/23/2022   Procedure: ESOPHAGOGASTRODUODENOSCOPY (EGD) WITH PROPOFOL ;  Surgeon: Leigh Elspeth SQUIBB, MD;  Location: [facility] ENDOSCOPY;  Service: Gastroenterology;  Laterality: N/A;   ESOPHAGOGASTRODUODENOSCOPY (EGD) WITH PROPOFOL  N/A 03/14/2023   Procedure: ESOPHAGOGASTRODUODENOSCOPY (EGD) WITH PROPOFOL ;  Surgeon: Therisa Bi, MD;  Location: [facility] ENDOSCOPY;  Service: Gastroenterology;  Laterality: N/A;   GIVENS CAPSULE STUDY Left 03/10/2022   Procedure: GIVENS CAPSULE STUDY;  Surgeon: Burnette Fallow, MD;  Location: [facility] ENDOSCOPY;  Service: Gastroenterology;  Laterality: Left;   HEMOSTASIS CLIP PLACEMENT  12/23/2022   Procedure: HEMOSTASIS CLIP PLACEMENT;  Surgeon: Leigh Elspeth SQUIBB, MD;  Location: [facility] ENDOSCOPY;  Service: Gastroenterology;;   HEMOSTASIS CONTROL  12/21/2022   Procedure: HEMOSTASIS CONTROL;  Surgeon: Leigh Elspeth SQUIBB, MD;  Location: [facility] ENDOSCOPY;  Service: Gastroenterology;;   INSERT / REPLACE / REMOVE PACEMAKER  01/24/2013    Medtronic Adapta L dual-chamber pacemaker, serial number NWE [id number]    LEFT ATRIAL APPENDAGE OCCLUSION N/A 06/22/2022   Procedure: LEFT ATRIAL APPENDAGE OCCLUSION;  Surgeon: Wonda Sharper, MD;  Location: [facility] INVASIVE CV LAB;  Service: Cardiovascular;  Laterality: N/A;   LEFT HEART CATH AND CORONARY ANGIOGRAPHY N/A 06/07/2018   Procedure:  LEFT HEART CATH AND CORONARY ANGIOGRAPHY;  Surgeon: Wonda Sharper, MD;  Location: [facility] INVASIVE CV LAB;  Service: Cardiovascular;  Laterality: N/A;   LEFT HEART CATHETERIZATION WITH CORONARY ANGIOGRAM N/A 03/17/2013   Procedure: LEFT HEART CATHETERIZATION WITH CORONARY ANGIOGRAM;  Surgeon: Lonni JONETTA Cash, MD; LAD mild dz, D1 branch 100%, inferior branch 99%, CFX OK, RCA 50%, EF 65%     LOOP RECORDER IMPLANT  2002   PERMANENT PACEMAKER INSERTION N/A 01/24/2013   Procedure: PERMANENT PACEMAKER INSERTION;  Surgeon: Danelle LELON Birmingham, MD;  Location: [facility] CATH LAB;  Service: Cardiovascular;  Laterality: N/A;   TEE WITHOUT CARDIOVERSION N/A 06/22/2022   Procedure: TRANSESOPHAGEAL ECHOCARDIOGRAM;  Surgeon: Wonda Sharper, MD;  Location: [facility] INVASIVE CV LAB;  Service: Cardiovascular;  Laterality: N/A;   TIBIAL TUBERCLERPLASTY  ~ 2003    Social History:   reports that he quit smoking about 10 years ago. His smoking use included cigarettes. He started smoking about 52 years ago. He has a 42 pack-year smoking history. He has never used smokeless tobacco. He reports that he does not currently use alcohol. He reports that he does not use drugs.  Allergies[1]  Family History  Problem Relation Age of Onset   Dementia Mother    Alzheimer's disease Mother    Osteoporosis Mother      Prior to Admission medications  Medication Sig Start Date End Date Taking? Authorizing Provider  albuterol  (VENTOLIN  HFA) 108 (  90 Base) MCG/ACT inhaler INHALE 2 PUFFS BY MOUTH EVERY 4 HOURS AS NEEDED FOR WHEEZE OR FOR SHORTNESS OF BREATH 01/02/24   Clark, Katherine K, NP  Ascorbic Acid (VITAMIN C) 1000 MG tablet Take 2,000 mg by mouth daily.    [provider]  aspirin  EC 81 MG tablet Take 1 tablet (81 mg total) by mouth daily. Swallow whole. 12/27/22   Cheryle Page, MD  blood glucose meter kit and supplies KIT Dispense based on patient and insurance preference. Use up to four times daily as directed. (FOR ICD-9  250.00, 250.01). 04/08/21   Clark, Katherine K, NP  budesonide -formoterol  (SYMBICORT ) 160-4.5 MCG/ACT inhaler Inhale 2 puffs into the lungs 2 (two) times daily as needed.    [provider]  clopidogrel  (PLAVIX ) 75 MG tablet Take 1 tablet (75 mg total) by mouth daily. 05/14/23   Jeffrie Oneil BROCKS, MD  empagliflozin  (JARDIANCE ) 25 MG TABS tablet TAKE 1 TABLET (25 MG TOTAL) BY MOUTH DAILY BEFORE BREAKFAST. FOR DIABETES. 01/02/24   Clark, Katherine K, NP  fluticasone  (FLONASE ) 50 MCG/ACT nasal spray PLACE 1 SPRAY INTO BOTH NOSTRILS DAILY AS NEEDED FOR ALLERGIES OR RHINITIS. 01/02/24   Clark, Katherine K, NP  gabapentin  (NEURONTIN ) 300 MG capsule TAKE 1 CAPSULE BY MOUTH EVERY MORNING AND 2 CAPSULES BY MOUTH AT NIGHT FOR NEUROPATHY 12/22/23   Clark, Katherine K, NP  glipiZIDE  (GLUCOTROL  XL) 5 MG 24 hr tablet TAKE 1 TABLET BY MOUTH DAILY WITH BREAKFAST FOR DIABETES 01/02/24   Clark, Katherine K, NP  glucose blood (ACCU-CHEK GUIDE) test strip TEST FOUR TIMES A DAY 09/04/22   Clark, Katherine K, NP  isosorbide  mononitrate (IMDUR ) 60 MG 24 hr tablet TAKE 1 TABLET BY MOUTH EVERY DAY 12/03/23   Thompson, Kathryn R, PA-C  nitroGLYCERIN  (NITROSTAT ) 0.4 MG SL tablet Place 1 tablet (0.4 mg total) under the tongue every 5 (five) minutes as needed for chest pain. Do not exceed 3 tablets in 24 hours. 10/30/23   Clark, Katherine K, NP  pantoprazole  (PROTONIX ) 40 MG tablet TAKE 1 TABLET (40 MG TOTAL) BY MOUTH DAILY. FOR ULCER 01/02/24   Clark, Katherine K, NP  primidone  (MYSOLINE ) 50 MG tablet Take 1 tablet (50 mg total) by mouth at bedtime. For tremors 04/08/24   Clark, Katherine K, NP  rosuvastatin  (CRESTOR ) 20 MG tablet TAKE 1 TABLET BY MOUTH EVERY DAY IN THE EVENING FOR CHOLESTEROL 10/30/23   Clark, Katherine K, NP  sertraline  (ZOLOFT ) 100 MG tablet TAKE 1.5 TABLETS (150 MG TOTAL) BY MOUTH DAILY. FOR ANXIETY AND DEPRESSION. 01/02/24   Clark, Katherine K, NP  sildenafil  (VIAGRA ) 25 MG tablet Take 2 to 3 tablets as needed  11/21/23   Waddell Danelle ORN, MD  tamsulosin  (FLOMAX ) 0.4 MG CAPS capsule TAKE 1 CAPSULE BY MOUTH EVERY DAY 11/21/23   McGowan, Clotilda A, PA-C  TYLENOL  500 MG tablet Take 1,000 mg by mouth daily.    [provider]    Physical Exam: Vitals:   05/07/24 2158 05/08/24 0019 05/08/24 0200 05/08/24 0250  BP: (!) 100/59 (!) 90/59 111/70   Pulse: 87 74 72   Resp: 16 17 18    Temp: 98.9 F (37.2 C)   98.3 F (36.8 C)  TempSrc: Oral   Oral  SpO2: 97% 99% 93%   Weight:      Height:        Constitutional: NAD, no pallor or diaphoresis   Eyes: PERTLA, lids and conjunctivae normal ENMT: Mucous membranes are moist. Posterior pharynx clear of  any exudate or lesions.   Neck: supple, no masses  Respiratory: no wheezing, no crackles. No accessory muscle use.  Cardiovascular: S1 & S2 heard, regular rate and rhythm. No JVD.   Abdomen: No tenderness, soft. Bowel sounds active.  Musculoskeletal: no clubbing / cyanosis. No joint deformity upper and lower extremities.   Skin: no significant rashes, lesions, ulcers. Warm, dry, well-perfused. Neurologic: CN 2-12 grossly intact. Moving all extremities. Alert and oriented.  Psychiatric: Calm. Cooperative.    Labs and Imaging on Admission: I have personally reviewed following labs and imaging studies  CBC: Recent Labs  Lab 05/07/24 2250  WBC 6.4  HGB 16.4  HCT 47.4  MCV 93.5  PLT 86*   Basic Metabolic Panel: Recent Labs  Lab 05/07/24 2250  NA 133*  K 4.5  CL 97*  CO2 19*  GLUCOSE 90  BUN 21  CREATININE 1.22  CALCIUM  9.5   GFR: Estimated Creatinine Clearance: 64 mL/min (by C-G formula based on SCr of 1.22 mg/dL). Liver Function Tests: Recent Labs  Lab 05/07/24 2250  AST 44*  ALT 33  ALKPHOS 75  BILITOT 0.7  PROT 7.2  ALBUMIN  4.5   No results for input(s): LIPASE, AMYLASE in the last 168 hours. No results for input(s): AMMONIA in the last 168 hours. Coagulation Profile: No results for input(s): INR, PROTIME in  the last 168 hours. Cardiac Enzymes: No results for input(s): CKTOTAL, CKMB, CKMBINDEX, TROPONINI in the last 168 hours. BNP (last 3 results) No results for input(s): PROBNP in the last 8760 hours. HbA1C: No results for input(s): HGBA1C in the last 72 hours. CBG: Recent Labs  Lab 05/07/24 2234  GLUCAP 96   Lipid Profile: No results for input(s): CHOL, HDL, LDLCALC, TRIG, CHOLHDL, LDLDIRECT in the last 72 hours. Thyroid  Function Tests: No results for input(s): TSH, T4TOTAL, FREET4, T3FREE, THYROIDAB in the last 72 hours. Anemia Panel: No results for input(s): VITAMINB12, FOLATE, FERRITIN, TIBC, IRON, RETICCTPCT in the last 72 hours. Urine analysis:    Component Value Date/Time   COLORURINE YELLOW 05/08/2024 0110   APPEARANCEUR CLEAR 05/08/2024 0110   APPEARANCEUR Clear 08/02/2022 1401   LABSPEC 1.018 05/08/2024 0110   LABSPEC 1.011 01/23/2013 0553   PHURINE 5.0 05/08/2024 0110   GLUCOSEU >=500 (A) 05/08/2024 0110   GLUCOSEU Negative 01/23/2013 0553   HGBUR NEGATIVE 05/08/2024 0110   BILIRUBINUR NEGATIVE 05/08/2024 0110   BILIRUBINUR Negative 08/02/2022 1401   BILIRUBINUR Negative 01/23/2013 0553   KETONESUR 20 (A) 05/08/2024 0110   PROTEINUR NEGATIVE 05/08/2024 0110   UROBILINOGEN negative (A) 05/26/2022 1607   NITRITE NEGATIVE 05/08/2024 0110   LEUKOCYTESUR NEGATIVE 05/08/2024 0110   LEUKOCYTESUR Negative 01/23/2013 0553   Sepsis Labs: @LABRCNTIP (procalcitonin:4,lacticidven:4) ) Recent Results (from the past 240 hours)  Resp panel by RT-PCR (RSV, Flu A&B, Covid) Urine, In & Out Cath     Status: Abnormal   Collection Time: 05/08/24  1:10 AM   Specimen: Urine, In & Out Cath; Nasal Swab  Result Value Ref Range Status   SARS Coronavirus 2 by RT PCR NEGATIVE NEGATIVE Final   Influenza A by PCR POSITIVE (A) NEGATIVE Final   Influenza B by PCR NEGATIVE NEGATIVE Final    Comment: (NOTE) The Xpert Xpress SARS-CoV-2/FLU/RSV plus  assay is intended as an aid in the diagnosis of influenza from Nasopharyngeal swab specimens and should not be used as a sole basis for treatment. Nasal washings and aspirates are unacceptable for Xpert Xpress SARS-CoV-2/FLU/RSV testing.  Fact Sheet for Patients: bloggercourse.com  Fact Sheet for Healthcare Providers: seriousbroker.it  This test is not yet approved or cleared by the [place]  FDA and has been authorized for detection and/or diagnosis of SARS-CoV-2 by FDA under an Emergency Use Authorization (EUA). This EUA will remain in effect (meaning this test can be used) for the duration of the COVID-19 declaration under Section 564(b)(1) of the Act, 21 U.S.C. section 360bbb-3(b)(1), unless the authorization is terminated or revoked.     Resp Syncytial Virus by PCR NEGATIVE NEGATIVE Final    Comment: (NOTE) Fact Sheet for Patients: bloggercourse.com  Fact Sheet for Healthcare Providers: seriousbroker.it  This test is not yet approved or cleared by the [place]  FDA and has been authorized for detection and/or diagnosis of SARS-CoV-2 by FDA under an Emergency Use Authorization (EUA). This EUA will remain in effect (meaning this test can be used) for the duration of the COVID-19 declaration under Section 564(b)(1) of the Act, 21 U.S.C. section 360bbb-3(b)(1), unless the authorization is terminated or revoked.  Performed at [facility], [address], [place], [place] [address]      Radiological Exams on Admission: DG Chest Portable 1 View Result Date: 05/08/2024 EXAM: 1 VIEW(S) XRAY OF THE CHEST 05/08/2024 01:01:00 AM COMPARISON: 02/25/2024 CLINICAL HISTORY: cough FINDINGS: LUNGS AND PLEURA: No focal pulmonary opacity. No pleural effusion. No pneumothorax. HEART AND MEDIASTINUM: Atherosclerotic calcifications. Left atrial appendage occlusion device in place.  The leads from the left chest wall dual lead cardiac pacing device extend into the heart. BONES AND SOFT TISSUES: Left chest wall dual lead cardiac pacing device in place. No acute osseous abnormality. IMPRESSION: 1. No acute cardiopulmonary abnormality. Electronically signed by: Pinkie Pebbles MD 05/08/2024 01:05 AM EST RP Workstation: [id number]   CT Head Wo Contrast Result Date: 05/08/2024 EXAM: CT HEAD WITHOUT CONTRAST 05/08/2024 12:47:49 AM TECHNIQUE: CT of the head was performed without the administration of intravenous contrast. Automated exposure control, iterative reconstruction, and/or weight based adjustment of the mA/kV was utilized to reduce the radiation dose to as low as reasonably achievable. COMPARISON: CT head 03/21/2024 CLINICAL HISTORY: Syncope/presyncope, cerebrovascular cause suspected FINDINGS: BRAIN AND VENTRICLES: No acute hemorrhage. No evidence of acute infarct. No hydrocephalus. No extra-axial collection. No mass effect or midline shift. ORBITS: No acute abnormality. SINUSES: No acute abnormality. SOFT TISSUES AND SKULL: No acute soft tissue abnormality. No skull fracture. IMPRESSION: 1. No acute intracranial abnormality. Electronically signed by: Glendia Molt MD 05/08/2024 12:51 AM EST RP Workstation: [id number]    EKG: Independently reviewed. Paced.   Assessment/Plan   1. Influenza A  - Presents with one day of severe fatigue, lightheadedness, and cough  - No evidence for bacterial infection, will check procalcitonin and hold further antibiotics for now, treat with Tamiflu  and supportive care    2. General weakness  - Primary complaint is severe fatigue and generalized weakness  - No acute findings on head CT  - Likely due to influenza, will check CK and TSH, hold gabapentin  and primidone , treat flu, and monitor clinically   3. CAD  - No anginal complaints  - Continue ASA, Crestor , Imdur     4. Atrial fibrillation  - S/p ablation and Watchman procedure, no longer  anticoagulated    5. Thrombocytopenia  - Chronic, worse today in setting of acute viral infection  - Repeat CBC in am    6. Type II DM  - A1c was 6.2% in December 2025  - Check CBGs and use low-intensity SSI for now    7. COPD  -  Not in exacerbation  - DuoNebs as-needed    8. PUD  - Continue PPI     DVT prophylaxis: SCDs  Code Status: Full  Level of Care: Level of care: Med-Surg Family Communication: None present   Disposition Plan:  Patient is from: Home  Anticipated d/c is to: Home  Anticipated d/c date is: 1/23 or 05/10/24 Patient currently: Pending clinical improvement Consults called: none Admission status: Observation     Evalene GORMAN Sprinkles, MD [facility]  05/08/2024, 4:13 AM       [1]  Allergies Allergen Reactions   Januvia  [Sitagliptin ] Other (See Comments)    Elevated lipase   Metformin  And Related Diarrhea   "

## 2024-05-09 ENCOUNTER — Ambulatory Visit: Attending: Cardiology | Admitting: Cardiology

## 2024-05-09 ENCOUNTER — Other Ambulatory Visit (HOSPITAL_COMMUNITY): Payer: Self-pay

## 2024-05-09 DIAGNOSIS — J101 Influenza due to other identified influenza virus with other respiratory manifestations: Secondary | ICD-10-CM | POA: Diagnosis not present

## 2024-05-09 LAB — GASTROINTESTINAL PANEL BY PCR, STOOL (REPLACES STOOL CULTURE)

## 2024-05-09 LAB — BASIC METABOLIC PANEL WITH GFR
Anion gap: 11 (ref 5–15)
BUN: 26 mg/dL — ABNORMAL HIGH (ref 8–23)
CO2: 22 mmol/L (ref 22–32)
Calcium: 8.8 mg/dL — ABNORMAL LOW (ref 8.9–10.3)
Chloride: 103 mmol/L (ref 98–111)
Creatinine, Ser: 1.05 mg/dL (ref 0.61–1.24)
GFR, Estimated: >60 mL/min
Glucose, Bld: 99 mg/dL (ref 70–99)
Potassium: 3.9 mmol/L (ref 3.5–5.1)
Sodium: 137 mmol/L (ref 135–145)

## 2024-05-09 LAB — CBC
HCT: 41.6 % (ref 39.0–52.0)
Hemoglobin: 14.5 g/dL (ref 13.0–17.0)
MCH: 32.5 pg (ref 26.0–34.0)
MCHC: 34.9 g/dL (ref 30.0–36.0)
MCV: 93.3 fL (ref 80.0–100.0)
Platelets: 70 K/uL — ABNORMAL LOW (ref 150–400)
RBC: 4.46 MIL/uL (ref 4.22–5.81)
RDW: 13.7 % (ref 11.5–15.5)
WBC: 3.7 K/uL — ABNORMAL LOW (ref 4.0–10.5)
nRBC: 0 % (ref 0.0–0.2)

## 2024-05-09 LAB — GLUCOSE, CAPILLARY: Glucose-Capillary: 103 mg/dL — ABNORMAL HIGH (ref 70–99)

## 2024-05-09 MED ORDER — OSELTAMIVIR PHOSPHATE 75 MG PO CAPS
75.0000 mg | ORAL_CAPSULE | Freq: Two times a day (BID) | ORAL | 0 refills | Status: AC
  Filled 2024-05-09: qty 7, 4d supply, fill #0

## 2024-05-09 NOTE — Progress Notes (Signed)
 Physical Therapy Treatment Patient Details Name: [name] Cook MRN: [id number] DOB: [birth year] Today's Date: 05/09/2024   History of Present Illness 74 y.o. male presents to [facility] 05/07/24 with generalized weakness, fatigue, and cough. Admitted with FluA+. PMHx:  hypertension, type 2 diabetes mellitus, CAD, atrial fibrillation status post ablation and Watchman device placement, history of PE no longer anticoagulated, CHB s/p PPM, PUD, chronic thrombocytopenia, depression, anxiety, COPD, and OSA with CPAP intolerance    PT Comments  Pt is currently presenting at Mod I for sit to stand, gait and supervision for stairs per home set up. Significant improvement in endurance from pt evaluation. Pt reports he has some assist at home and a RW. Due to pt current functional status, home set up and available assistance at home no recommended skilled physical therapy services at this time on discharge from acute care hospital setting. Will continue to follow in acute setting in order to ensure that pt returns home with decreased risk for falls, injury, re-hospitalization and improved activity tolerance.      If plan is discharge home, recommend the following: Help with stairs or ramp for entrance;Assistance with cooking/housework     Equipment Recommendations  None recommended by PT       Precautions / Restrictions Precautions Precautions: Fall Recall of Precautions/Restrictions: Intact Restrictions Weight Bearing Restrictions Per Provider Order: No     Mobility  Bed Mobility   General bed mobility comments: Pt sitting at EOB upon arrival and at end of session    Transfers Overall transfer level: Modified independent Equipment used: None, Rolling walker (2 wheels) Transfers: Sit to/from Stand Sit to Stand: Modified independent (Device/Increase time)      Ambulation/Gait Ambulation/Gait assistance: Modified independent (Device/Increase time) Gait Distance (Feet): 200 Feet Assistive device:  Rolling walker (2 wheels) Gait Pattern/deviations: Step-through pattern, Decreased stride length Gait velocity: mildly decreased Gait velocity interpretation: >2.62 ft/sec, indicative of community ambulatory   General Gait Details: very light UE support on RW   Stairs Stairs: Yes Stairs assistance: Supervision Stair Management: One rail Right, Forwards, Backwards, Alternating pattern Number of Stairs: 6 General stair comments: ascended forward, descended posteriorl      Balance Overall balance assessment: Modified Independent   Sitting balance-Leahy Scale: Good       Standing balance-Leahy Scale: Good      Communication Communication Communication: No apparent difficulties  Cognition Arousal: Alert Behavior During Therapy: WFL for tasks assessed/performed   PT - Cognitive impairments: No apparent impairments     Following commands: Intact      Cueing Cueing Techniques: Verbal cues     General Comments General comments (skin integrity, edema, etc.): No signs/symptoms of cardiac/respiratory distress      Pertinent Vitals/Pain Pain Assessment Pain Assessment: No/denies pain    Home Living Family/patient expects to be discharged to:: Private residence Living Arrangements: Spouse/significant other;Parent (girlfriend and her mother) Available Help at Discharge: Family;Available 24 hours/day Type of Home: Mobile home Home Access: Stairs to enter Entrance Stairs-Rails: Right;Left;Can reach both Entrance Stairs-Number of Steps: 6   Home Layout: One level Home Equipment: Agricultural Consultant (2 wheels);Cane - single point          PT Goals (current goals can now be found in the care plan section) Acute Rehab PT Goals Patient Stated Goal: to feel better PT Goal Formulation: With patient Time For Goal Achievement: 05/22/24 Potential to Achieve Goals: Good Progress towards PT goals: Progressing toward goals    Frequency    Min 2X/week  PT Plan  Updated  discharged recommendations    AM-PAC PT 6 Clicks Mobility   Outcome Measure  Help needed turning from your back to your side while in a flat bed without using bedrails?: None Help needed moving from lying on your back to sitting on the side of a flat bed without using bedrails?: None Help needed moving to and from a bed to a chair (including a wheelchair)?: None Help needed standing up from a chair using your arms (e.g., wheelchair or bedside chair)?: None Help needed to walk in hospital room?: None Help needed climbing 3-5 steps with a railing? : A Little 6 Click Score: 23    End of Session Equipment Utilized During Treatment: Gait belt Activity Tolerance: Patient tolerated treatment well Patient left: in bed;with call bell/phone within reach Nurse Communication: Mobility status PT Visit Diagnosis: Other abnormalities of gait and mobility (R26.89);Unsteadiness on feet (R26.81);Muscle weakness (generalized) (M62.81)     Time: 8965-8955 PT Time Calculation (min) (ACUTE ONLY): 10 min  Charges:    $Therapeutic Activity: 8-22 mins PT General Charges $$ ACUTE PT VISIT: 1 Visit                    Dorothyann Maier, DPT, CLT  Acute Rehabilitation Services Office: [phone] (Secure chat preferred)    Dorothyann VEAR Maier 05/09/2024, 12:16 PM

## 2024-05-09 NOTE — Discharge Summary (Signed)
 Physician Discharge Summary  STACI CARVER FMW:[id number] DOB: 31-Oct-1950 DOA: 05/07/2024  PCP: Gretta Comer POUR, NP  Admit date: 05/07/2024 Discharge date: 05/09/2024 Recommendations for Outpatient Follow-up:  Follow up with PCP in 1 weeks and check CBC  Discharge Dispo: home Discharge Condition: Stable Code Status:   Code Status: Full Code Diet recommendation:  Diet Order             Diet regular Fluid consistency: Thin  Diet effective now                    Brief/Interim Summary: TRAYVEON BECKFORD is a 74 y.o. male with PMH of  hypertension, type 2 diabetes mellitus, CAD, atrial fibrillation status post ablation and Watchman device placement, history of PE no longer anticoagulated, CHB s/p PPM, PUD, chronic thrombocytopenia, depression, anxiety, COPD, and OSA with CPAP intolerance who presents with cough, severe generalized weakness, fatigue, for 1 day. In the ZI:jqzampoz vitals fairly stable tested positive for influenza, plat 86,000, CT head and chest x-ray-no acute finding.  Blood cultures were collected given IV fluids IV antibiotic and admitted  Placed on Tamiflu , had some diarrhea but C. difficile negative likely viral.  Blood culture no growth so far. Seen by PT OT patient reports weakness is improved and he would like to go home today  Subjective: Seen and examined  Feels much improved, eager to go home today Overnight remains afebrile BP stable on room air, labs showed stable electrolytes mild leukopenia, thrombocytopenia Diarrhea negative for C. difficile  Discharge Diagnoses:   Acute influenza A infection Generalized weakness/deconditioning: Patient with 1 day of severe fatigue lightheadedness, cough, chest x-ray unremarkable, no fever.  CK and TSH normal, Pro-Cal reassuring,blood culture NGTD.Continue Tamiflu  to complete course, seen by PT OT ambulating without walker B12 TSH vitamin D  stable  Acute on chronic thrombocytopenia Mild leukopenia: previous baseline  variable ~90 Septembe 2024, 111-117 in December, suspect thrombocytopenia and leukopenia due to viral illness Advised to follow-up with PCP to check CBC in 1 week. Recent Labs  Lab 05/07/24 2250 05/08/24 0533 05/09/24 0640  HGB 16.4 14.8 14.5  HCT 47.4 43.6 41.6  WBC 6.4 4.9 3.7*  PLT 86* 75* 70*    Diarrhea: Suspect in the setting of viral illness, C. difficile negative GI pathogen panel pending  CAD: without chest pain-on aspirin  Crestor  Imdur   Atrial fibrillation: s/p ablation and Watchman procedure not on OAC  Type 2 diabetes: stable A1c and blood sugar controlled Recent Labs  Lab 05/08/24 0924 05/08/24 1431 05/08/24 1615 05/08/24 2027 05/09/24 0826  GLUCAP 237* 200* 125* 216* 103*    COPD: not exacerbation-continue as needed bronchodilators  History of PUD: On PPI  Mobility: PT Orders: Active PT Follow up Rec: Home Health Pt (Progression To Op Pt)05/08/2024 1438    DVT prophylaxis: SCDs Start: 05/08/24 9666 Code Status:   Code Status: Full Code Family Communication: plan of care discussed with patient at bedside. Patient status is: Remains hospitalized because of severity of illness Level of care: Med-Surg   Dispo: The patient is from: home with girlfriend and her mother            Anticipated disposition: home Objective: Vitals last 24 hrs: Vitals:   05/08/24 2022 05/09/24 0010 05/09/24 0447 05/09/24 0825  BP: 105/60 117/67 114/69 127/76  Pulse: 71 89 73 71  Resp: 20 20 20 18   Temp: (!) 97.4 F (36.3 C) 98.2 F (36.8 C) (!) 97.3 F (36.3 C)   TempSrc:  SpO2: 99% 94% 95% 97%  Weight:      Height:        Physical Examination: General exam: AAAOX3 HEENT:Oral mucosa moist, Ear/Nose WNL grossly Respiratory system: Bilaterally clear BS,no use of accessory muscle Cardiovascular system: S1 & S2 +, No JVD. Gastrointestinal system: Abdomen soft,NT,ND, BS+ Nervous System: Alert, awake, moving all extremities,and following commands. Extremities:  extremities warm, leg edema neg Skin: Warm, no rashes MSK: Normal muscle bulk,tone, power with generalized weakness   Consultation: See note.  Discharge Instructions  Discharge Instructions     Discharge instructions   Complete by: As directed    Please call call MD or return to ER for similar or worsening recurring problem that brought you to hospital or if any fever,nausea/vomiting,abdominal pain, uncontrolled pain, chest pain,  shortness of breath or any other alarming symptoms.  Check CBC for thrombocytopenia and leukopenia in 1 wk  Please follow-up your doctor as instructed in a week time and call the office for appointment.  Please avoid alcohol, smoking, or any other illicit substance and maintain healthy habits including taking your regular medications as prescribed.  You were cared for by a hospitalist during your hospital stay. If you have any questions about your discharge medications or the care you received while you were in the hospital after you are discharged, you can call the unit and ask to speak with the hospitalist on call if the hospitalist that took care of you is not available.  Once you are discharged, your primary care physician will handle any further medical issues. Please note that NO REFILLS for any discharge medications will be authorized once you are discharged, as it is imperative that you return to your primary care physician (or establish a relationship with a primary care physician if you do not have one) for your aftercare needs so that they can reassess your need for medications and monitor your lab values   Increase activity slowly   Complete by: As directed       Allergies as of 05/09/2024       Reactions   Januvia  [sitagliptin ] Other (See Comments)   Elevated lipase   Metformin  And Related Diarrhea   Tylenol  [acetaminophen ] Other (See Comments)   per S/O, patient was told by MD to stop taking.        Medication List     TAKE these  medications    albuterol  108 (90 Base) MCG/ACT inhaler Commonly known as: VENTOLIN  HFA INHALE 2 PUFFS BY MOUTH EVERY 4 HOURS AS NEEDED FOR WHEEZE OR FOR SHORTNESS OF BREATH   aspirin  EC 81 MG tablet Take 1 tablet (81 mg total) by mouth daily. Swallow whole.   clopidogrel  75 MG tablet Commonly known as: PLAVIX  Take 1 tablet (75 mg total) by mouth daily.   fluticasone  50 MCG/ACT nasal spray Commonly known as: FLONASE  PLACE 1 SPRAY INTO BOTH NOSTRILS DAILY AS NEEDED FOR ALLERGIES OR RHINITIS.   gabapentin  300 MG capsule Commonly known as: NEURONTIN  TAKE 1 CAPSULE BY MOUTH EVERY MORNING AND 2 CAPSULES BY MOUTH AT NIGHT FOR NEUROPATHY   Gas-X Extra Strength 125 MG chewable tablet Generic drug: simethicone  Chew 250 mg by mouth at bedtime.   glipiZIDE  5 MG 24 hr tablet Commonly known as: GLUCOTROL  XL TAKE 1 TABLET BY MOUTH DAILY WITH BREAKFAST FOR DIABETES What changed: See the new instructions.   isosorbide  mononitrate 60 MG 24 hr tablet Commonly known as: IMDUR  TAKE 1 TABLET BY MOUTH EVERY DAY   Jardiance  25  MG Tabs tablet Generic drug: empagliflozin  TAKE 1 TABLET (25 MG TOTAL) BY MOUTH DAILY BEFORE BREAKFAST. FOR DIABETES. What changed: See the new instructions.   Mens 50+ Multivitamin Tabs Take 1 tablet by mouth daily.   naproxen  sodium 220 MG tablet Commonly known as: ALEVE  Take 220 mg by mouth at bedtime.   nitroGLYCERIN  0.4 MG SL tablet Commonly known as: NITROSTAT  Place 1 tablet (0.4 mg total) under the tongue every 5 (five) minutes as needed for chest pain. Do not exceed 3 tablets in 24 hours.   oseltamivir  75 MG capsule Commonly known as: TAMIFLU  Take 1 capsule (75 mg total) by mouth 2 (two) times daily for 7 doses.   pantoprazole  40 MG tablet Commonly known as: PROTONIX  TAKE 1 TABLET (40 MG TOTAL) BY MOUTH DAILY. FOR ULCER   primidone  50 MG tablet Commonly known as: MYSOLINE  Take 1 tablet (50 mg total) by mouth at bedtime. For tremors   rosuvastatin   20 MG tablet Commonly known as: CRESTOR  TAKE 1 TABLET BY MOUTH EVERY DAY IN THE EVENING FOR CHOLESTEROL What changed: See the new instructions.   sertraline  100 MG tablet Commonly known as: ZOLOFT  TAKE 1.5 TABLETS (150 MG TOTAL) BY MOUTH DAILY. FOR ANXIETY AND DEPRESSION.   sildenafil  25 MG tablet Commonly known as: Viagra  Take 2 to 3 tablets as needed What changed:  how much to take how to take this when to take this reasons to take this additional instructions   tamsulosin  0.4 MG Caps capsule Commonly known as: FLOMAX  TAKE 1 CAPSULE BY MOUTH EVERY DAY   tiZANidine  4 MG tablet Commonly known as: ZANAFLEX  Take 4 mg by mouth at bedtime as needed for muscle spasms.   VITAMIN C PO Take 1 tablet by mouth daily.               Durable Medical Equipment  (From admission, onward)           Start     Ordered   05/08/24 1551  For home use only DME Bedside commode  Once       Comments: Needs 3:1 since patient is unable to mobilize to bathroom facilities.  Question:  Patient needs a bedside commode to treat with the following condition  Answer:  Influenza   05/08/24 1551            Follow-up Information     Gretta Comer POUR, NP .   Specialty: Internal Medicine Why: Check CBC for thrombocytopenia and leukopenia in 1 Contact information: [address] [place] [address] [phone]                Allergies[1]  The results of significant diagnostics from this hospitalization (including imaging, microbiology, ancillary and laboratory) are listed below for reference.    Microbiology: Recent Results (from the past 240 hours)  Resp panel by RT-PCR (RSV, Flu A&B, Covid) Urine, In & Out Cath     Status: Abnormal   Collection Time: 05/08/24  1:10 AM   Specimen: Urine, In & Out Cath; Nasal Swab  Result Value Ref Range Status   SARS Coronavirus 2 by RT PCR NEGATIVE NEGATIVE Final   Influenza A by PCR POSITIVE (A) NEGATIVE Final   Influenza B by  PCR NEGATIVE NEGATIVE Final    Comment: (NOTE) The Xpert Xpress SARS-CoV-2/FLU/RSV plus assay is intended as an aid in the diagnosis of influenza from Nasopharyngeal swab specimens and should not be used as a sole basis for treatment. Nasal washings and  aspirates are unacceptable for Xpert Xpress SARS-CoV-2/FLU/RSV testing.  Fact Sheet for Patients: bloggercourse.com  Fact Sheet for Healthcare Providers: seriousbroker.it  This test is not yet approved or cleared by the [place]  FDA and has been authorized for detection and/or diagnosis of SARS-CoV-2 by FDA under an Emergency Use Authorization (EUA). This EUA will remain in effect (meaning this test can be used) for the duration of the COVID-19 declaration under Section 564(b)(1) of the Act, 21 U.S.C. section 360bbb-3(b)(1), unless the authorization is terminated or revoked.     Resp Syncytial Virus by PCR NEGATIVE NEGATIVE Final    Comment: (NOTE) Fact Sheet for Patients: bloggercourse.com  Fact Sheet for Healthcare Providers: seriousbroker.it  This test is not yet approved or cleared by the [place]  FDA and has been authorized for detection and/or diagnosis of SARS-CoV-2 by FDA under an Emergency Use Authorization (EUA). This EUA will remain in effect (meaning this test can be used) for the duration of the COVID-19 declaration under Section 564(b)(1) of the Act, 21 U.S.C. section 360bbb-3(b)(1), unless the authorization is terminated or revoked.  Performed at [facility], [address] [place], [place] [address]   Blood culture (routine x 2)     Status: None (Preliminary result)   Collection Time: 05/08/24  2:25 AM   Specimen: BLOOD  Result Value Ref Range Status   Specimen Description BLOOD SITE NOT SPECIFIED  Final   Special Requests   Final    BOTTLES DRAWN AEROBIC AND ANAEROBIC Blood Culture  adequate volume   Culture   Final    NO GROWTH 1 DAY Performed at [facility], [address], [place], [place] [address]    Report Status PENDING  Incomplete  Blood culture (routine x 2)     Status: None (Preliminary result)   Collection Time: 05/08/24  2:31 AM   Specimen: BLOOD  Result Value Ref Range Status   Specimen Description BLOOD SITE NOT SPECIFIED  Final   Special Requests   Final    BOTTLES DRAWN AEROBIC AND ANAEROBIC Blood Culture adequate volume   Culture   Final    NO GROWTH 1 DAY Performed at [facility], [address] [place], [place] [address]    Report Status PENDING  Incomplete  C Difficile Quick Screen w PCR reflex     Status: None   Collection Time: 05/08/24  9:46 PM   Specimen: STOOL  Result Value Ref Range Status   C Diff antigen NEGATIVE NEGATIVE Final   C Diff toxin NEGATIVE NEGATIVE Final   C Diff interpretation No C. difficile detected.  Final    Comment: Performed at [facility], [address] [place], [place] [address]    Procedures/Studies: DG Chest Portable 1 View Result Date: 05/08/2024 EXAM: 1 VIEW(S) XRAY OF THE CHEST 05/08/2024 01:01:00 AM COMPARISON: 02/25/2024 CLINICAL HISTORY: cough FINDINGS: LUNGS AND PLEURA: No focal pulmonary opacity. No pleural effusion. No pneumothorax. HEART AND MEDIASTINUM: Atherosclerotic calcifications. Left atrial appendage occlusion device in place. The leads from the left chest wall dual lead cardiac pacing device extend into the heart. BONES AND SOFT TISSUES: Left chest wall dual lead cardiac pacing device in place. No acute osseous abnormality. IMPRESSION: 1. No acute cardiopulmonary abnormality. Electronically signed by: Pinkie Pebbles MD 05/08/2024 01:05 AM EST RP Workstation: [id number]   CT Head Wo Contrast Result Date: 05/08/2024 EXAM: CT HEAD WITHOUT CONTRAST 05/08/2024 12:47:49 AM TECHNIQUE: CT of the head was performed without the administration  of intravenous contrast. Automated  exposure control, iterative reconstruction, and/or weight based adjustment of the mA/kV was utilized to reduce the radiation dose to as low as reasonably achievable. COMPARISON: CT head 03/21/2024 CLINICAL HISTORY: Syncope/presyncope, cerebrovascular cause suspected FINDINGS: BRAIN AND VENTRICLES: No acute hemorrhage. No evidence of acute infarct. No hydrocephalus. No extra-axial collection. No mass effect or midline shift. ORBITS: No acute abnormality. SINUSES: No acute abnormality. SOFT TISSUES AND SKULL: No acute soft tissue abnormality. No skull fracture. IMPRESSION: 1. No acute intracranial abnormality. Electronically signed by: Glendia Molt MD 05/08/2024 12:51 AM EST RP Workstation: [id number]    Labs: BNP (last 3 results) No results for input(s): BNP in the last 8760 hours. Basic Metabolic Panel: Recent Labs  Lab 05/07/24 2250 05/08/24 0533 05/09/24 0640  NA 133* 137 137  K 4.5 4.1 3.9  CL 97* 103 103  CO2 19* 22 22  GLUCOSE 90 74 99  BUN 21 23 26*  CREATININE 1.22 1.22 1.05  CALCIUM  9.5 9.0 8.8*   Liver Function Tests: Recent Labs  Lab 05/07/24 2250  AST 44*  ALT 33  ALKPHOS 75  BILITOT 0.7  PROT 7.2  ALBUMIN  4.5   No results for input(s): LIPASE, AMYLASE in the last 168 hours. No results for input(s): AMMONIA in the last 168 hours. CBC: Recent Labs  Lab 05/07/24 2250 05/08/24 0533 05/09/24 0640  WBC 6.4 4.9 3.7*  HGB 16.4 14.8 14.5  HCT 47.4 43.6 41.6  MCV 93.5 95.6 93.3  PLT 86* 75* 70*   CBG: Recent Labs  Lab 05/08/24 0924 05/08/24 1431 05/08/24 1615 05/08/24 2027 05/09/24 0826  GLUCAP 237* 200* 125* 216* 103*   Hgb A1c No results for input(s): HGBA1C in the last 72 hours. Anemia work up Recent Labs    05/08/24 0533  VITAMINB12 590  Cardiac Enzymes: Recent Labs  Lab 05/08/24 0533  CKTOTAL 113   BNP: Invalid input(s): POCBNP D-Dimer No results for input(s): DDIMER in the last 72 hours. Lipid Profile No results for  input(s): CHOL, HDL, LDLCALC, TRIG, CHOLHDL, LDLDIRECT in the last 72 hours. Thyroid  function studies Recent Labs    05/08/24 0533  TSH 1.530   Urinalysis    Component Value Date/Time   COLORURINE YELLOW 05/08/2024 0110   APPEARANCEUR CLEAR 05/08/2024 0110   APPEARANCEUR Clear 08/02/2022 1401   LABSPEC 1.018 05/08/2024 0110   LABSPEC 1.011 01/23/2013 0553   PHURINE 5.0 05/08/2024 0110   GLUCOSEU >=500 (A) 05/08/2024 0110   GLUCOSEU Negative 01/23/2013 0553   HGBUR NEGATIVE 05/08/2024 0110   BILIRUBINUR NEGATIVE 05/08/2024 0110   BILIRUBINUR Negative 08/02/2022 1401   BILIRUBINUR Negative 01/23/2013 0553   KETONESUR 20 (A) 05/08/2024 0110   PROTEINUR NEGATIVE 05/08/2024 0110   UROBILINOGEN negative (A) 05/26/2022 1607   NITRITE NEGATIVE 05/08/2024 0110   LEUKOCYTESUR NEGATIVE 05/08/2024 0110   LEUKOCYTESUR Negative 01/23/2013 0553   Sepsis Labs Recent Labs  Lab 05/07/24 2250 05/08/24 0533 05/09/24 0640  WBC 6.4 4.9 3.7*   Microbiology Recent Results (from the past 240 hours)  Resp panel by RT-PCR (RSV, Flu A&B, Covid) Urine, In & Out Cath     Status: Abnormal   Collection Time: 05/08/24  1:10 AM   Specimen: Urine, In & Out Cath; Nasal Swab  Result Value Ref Range Status   SARS Coronavirus 2 by RT PCR NEGATIVE NEGATIVE Final   Influenza A by PCR POSITIVE (A) NEGATIVE Final   Influenza B by PCR NEGATIVE NEGATIVE Final    Comment: (NOTE) The  Xpert Xpress SARS-CoV-2/FLU/RSV plus assay is intended as an aid in the diagnosis of influenza from Nasopharyngeal swab specimens and should not be used as a sole basis for treatment. Nasal washings and aspirates are unacceptable for Xpert Xpress SARS-CoV-2/FLU/RSV testing.  Fact Sheet for Patients: bloggercourse.com  Fact Sheet for Healthcare Providers: seriousbroker.it  This test is not yet approved or cleared by the [place]  FDA and has been authorized for  detection and/or diagnosis of SARS-CoV-2 by FDA under an Emergency Use Authorization (EUA). This EUA will remain in effect (meaning this test can be used) for the duration of the COVID-19 declaration under Section 564(b)(1) of the Act, 21 U.S.C. section 360bbb-3(b)(1), unless the authorization is terminated or revoked.     Resp Syncytial Virus by PCR NEGATIVE NEGATIVE Final    Comment: (NOTE) Fact Sheet for Patients: bloggercourse.com  Fact Sheet for Healthcare Providers: seriousbroker.it  This test is not yet approved or cleared by the [place]  FDA and has been authorized for detection and/or diagnosis of SARS-CoV-2 by FDA under an Emergency Use Authorization (EUA). This EUA will remain in effect (meaning this test can be used) for the duration of the COVID-19 declaration under Section 564(b)(1) of the Act, 21 U.S.C. section 360bbb-3(b)(1), unless the authorization is terminated or revoked.  Performed at [facility], [address] [place], [place] [address]   Blood culture (routine x 2)     Status: None (Preliminary result)   Collection Time: 05/08/24  2:25 AM   Specimen: BLOOD  Result Value Ref Range Status   Specimen Description BLOOD SITE NOT SPECIFIED  Final   Special Requests   Final    BOTTLES DRAWN AEROBIC AND ANAEROBIC Blood Culture adequate volume   Culture   Final    NO GROWTH 1 DAY Performed at [facility], [address], [place], [place] [address]    Report Status PENDING  Incomplete  Blood culture (routine x 2)     Status: None (Preliminary result)   Collection Time: 05/08/24  2:31 AM   Specimen: BLOOD  Result Value Ref Range Status   Specimen Description BLOOD SITE NOT SPECIFIED  Final   Special Requests   Final    BOTTLES DRAWN AEROBIC AND ANAEROBIC Blood Culture adequate volume   Culture   Final    NO GROWTH 1 DAY Performed at [facility], [address] [place], [place]  [address]    Report Status PENDING  Incomplete  C Difficile Quick Screen w PCR reflex     Status: None   Collection Time: 05/08/24  9:46 PM   Specimen: STOOL  Result Value Ref Range Status   C Diff antigen NEGATIVE NEGATIVE Final   C Diff toxin NEGATIVE NEGATIVE Final   C Diff interpretation No C. difficile detected.  Final    Comment: Performed at [facility], [address] [place], [place] [address]  Time coordinating discharge:  25  minutes  SIGNED: Mennie LAMY, MD  [facility] 05/09/2024, 10:40 AM  If 7PM-7AM, please contact night-coverage [email or web address]       [1]  Allergies Allergen Reactions   Januvia  [Sitagliptin ] Other (See Comments)    Elevated lipase   Metformin  And Related Diarrhea   Tylenol  [Acetaminophen ] Other (See Comments)    per S/O, patient was told by MD to stop taking.

## 2024-05-09 NOTE — Evaluation (Signed)
 Occupational Therapy Evaluation Patient Details Name: [name] Cook MRN: [id number] DOB: 1950-11-05 Today's Date: 05/09/2024   History of Present Illness   74 y.o. male presents to [facility] 05/07/24 with generalized weakness, fatigue, and cough. Admitted with FluA+. PMHx:  hypertension, type 2 diabetes mellitus, CAD, atrial fibrillation status post ablation and Watchman device placement, history of PE no longer anticoagulated, CHB s/p PPM, PUD, chronic thrombocytopenia, depression, anxiety, COPD, and OSA with CPAP intolerance     Clinical Impressions At baseline, pt is Independent with ADLs, IADLs, and functional mobility without an AD. Pt now presents with decreased activity tolerance, decreased balance, and decreased safety and independence with functional tasks. Pt currently demonstrating ability to complete ADLs Independent to Supervision and functional mobility short distances in room without an AD with Supervision. Recommend RW for longer distances for safety at this time for increased safety and energy conservation. VSS on RA. Pt will benefit from acute OT to address deficits and increase safety and independence with functional tasks. No post acute skilled OT needs are anticipated.      If plan is discharge home, recommend the following:   A little help with walking and/or transfers;A little help with bathing/dressing/bathroom;Assistance with cooking/housework;Assist for transportation;Help with stairs or ramp for entrance     Functional Status Assessment   Patient has had a recent decline in their functional status and demonstrates the ability to make significant improvements in function in a reasonable and predictable amount of time.     Equipment Recommendations   None recommended by OT     Recommendations for Other Services         Precautions/Restrictions   Precautions Precautions: Fall Recall of Precautions/Restrictions: Intact Restrictions Weight Bearing  Restrictions Per Provider Order: No     Mobility Bed Mobility               General bed mobility comments: Pt sitting at EOB upon arrival and at end of session    Transfers Overall transfer level: Needs assistance Equipment used: None Transfers: Sit to/from Stand, Bed to chair/wheelchair/BSC Sit to Stand: Supervision     Step pivot transfers: Supervision     General transfer comment: Supervision for safety      Balance Overall balance assessment: Needs assistance, Mild deficits observed, not formally tested Sitting-balance support: No upper extremity supported, Feet supported Sitting balance-Leahy Scale: Good     Standing balance support: During functional activity, Reliant on assistive device for balance, Single extremity supported, No upper extremity supported Standing balance-Leahy Scale: Fair Standing balance comment: able to maintain static and dynamic standing balance, but with mild deficits present                           ADL either performed or assessed with clinical judgement   ADL Overall ADL's : Needs assistance/impaired Eating/Feeding: Independent;Sitting   Grooming: Supervision/safety;Standing   Upper Body Bathing: Set up;Supervision/ safety;Sitting   Lower Body Bathing: Set up;Supervison/ safety;Sitting/lateral leans;Sit to/from stand   Upper Body Dressing : Modified independent;Sitting   Lower Body Dressing: Supervision/safety;Sit to/from stand;Sitting/lateral leans   Toilet Transfer: Supervision/safety;Ambulation;Regular Toilet;Grab bars (without an AD)   Toileting- Clothing Manipulation and Hygiene: Supervision/safety;Sit to/from stand       Functional mobility during ADLs: Supervision/safety (without an AD, approximately 30 feet; would likely benefit from use of AD for longer distances due to maild balance deficits and for energy conservation) General ADL Comments: Pt with decreased activity tolerance during functional tasks  Vision Patient Visual Report: No change from baseline Additional Comments: Vision [facility] for tasks assessed; not formally screened or evaluated     Perception         Praxis         Pertinent Vitals/Pain Pain Assessment Pain Assessment: No/denies pain     Extremity/Trunk Assessment Upper Extremity Assessment Upper Extremity Assessment: Right hand dominant;RUE deficits/detail;LUE deficits/detail RUE Deficits / Details: mildlly impaired fine motor coordination with pt reporting this is baseline; AROM, strength, and sensation WFL RUE Coordination: decreased fine motor (pt reports at baseline) LUE Deficits / Details: mildlly impaired fine motor coordination with pt reporting this is baseline; AROM, strength, and sensation WFL LUE Coordination: decreased fine motor (pt reports at baseline)   Lower Extremity Assessment Lower Extremity Assessment: Defer to PT evaluation   Cervical / Trunk Assessment Cervical / Trunk Assessment: Normal   Communication Communication Communication: No apparent difficulties   Cognition Arousal: Alert Behavior During Therapy: WFL for tasks assessed/performed Cognition: No family/caregiver present to determine baseline             OT - Cognition Comments: Pt AAOx4 and pleasant throughout session with mild short-term memory deficits noted, but otherwise with cognition WFL for tasks assessed.                 Following commands: Intact       Cueing  General Comments   Cueing Techniques: Verbal cues  VSS on RA. Pt reports he is feeling better than he did yesterday   Exercises     Shoulder Instructions      Home Living Family/patient expects to be discharged to:: Private residence Living Arrangements: Spouse/significant other;Parent (girlfriend and her mother) Available Help at Discharge: Family;Available 24 hours/day Type of Home: Mobile home Home Access: Stairs to enter Entrance Stairs-Number of Steps: 6 Entrance  Stairs-Rails: Right;Left;Can reach both Home Layout: One level     Bathroom Shower/Tub: Walk-in shower         Home Equipment: Agricultural Consultant (2 wheels);Cane - single point          Prior Functioning/Environment Prior Level of Function : Independent/Modified Independent;Driving             Mobility Comments: Ind with no AD ADLs Comments: Ind    OT Problem List: Decreased activity tolerance;Impaired balance (sitting and/or standing)   OT Treatment/Interventions: Self-care/ADL training;Therapeutic exercise;Energy conservation;DME and/or AE instruction;Therapeutic activities;Patient/family education;Balance training      OT Goals(Current goals can be found in the care plan section)   Acute Rehab OT Goals Patient Stated Goal: to return home OT Goal Formulation: With patient Time For Goal Achievement: 05/23/24 Potential to Achieve Goals: Good ADL Goals Pt Will Perform Grooming: Independently;standing Pt Will Perform Lower Body Bathing: Independently;sitting/lateral leans;sit to/from stand Pt Will Perform Lower Body Dressing: Independently;sitting/lateral leans;sit to/from stand Pt Will Transfer to Toilet: with modified independence;ambulating;regular height toilet (with least restrictive AD) Additional ADL Goal #1: Patient will demonstrate ability to Independently state 3 energy conservation strategeies for increased safety and independence with functional tasks.   OT Frequency:  Min 1X/week    Co-evaluation              AM-PAC OT 6 Clicks Daily Activity     Outcome Measure Help from another person eating meals?: None Help from another person taking care of personal grooming?: A Little Help from another person toileting, which includes using toliet, bedpan, or urinal?: A Little Help from another person bathing (including washing, rinsing, drying)?: A  Little Help from another person to put on and taking off regular upper body clothing?: None Help from  another person to put on and taking off regular lower body clothing?: A Little 6 Click Score: 20   End of Session Nurse Communication: Mobility status  Activity Tolerance: Patient tolerated treatment well Patient left: in bed;with call bell/phone within reach;Other (comment) (sitting EOB)  OT Visit Diagnosis: Unsteadiness on feet (R26.81);Other (comment) (decreased activity tolerance)                Time: 9050-8995 OT Time Calculation (min): 15 min Charges:  OT General Charges $OT Visit: 1 Visit OT Evaluation $OT Eval Low Complexity: 1 Low  Margarie Rockey HERO., OTR/L, MA Acute Rehab [phone]   Margarie FORBES Horns 05/09/2024, 10:40 AM

## 2024-05-09 NOTE — Plan of Care (Signed)
  Problem: Pain Managment: Goal: General experience of comfort will improve and/or be controlled Outcome: Progressing   Problem: Safety: Goal: Ability to remain free from injury will improve Outcome: Progressing   Problem: Skin Integrity: Goal: Risk for impaired skin integrity will decrease Outcome: Progressing

## 2024-05-12 ENCOUNTER — Telehealth: Payer: Self-pay

## 2024-05-12 NOTE — Transitions of Care (Post Inpatient/ED Visit) (Signed)
" ° °  05/12/2024  Name: [name] Cook MRN: [id number] DOB: 1950/11/15  Today's TOC FU Call Status: Today's TOC FU Call Status:: Unsuccessful Call (1st Attempt) Unsuccessful Call (1st Attempt) Date: 05/12/24  Attempted to reach the patient regarding the most recent Inpatient/ED visit.  Follow Up Plan: Additional outreach attempts will be made to reach the patient to complete the Transitions of Care (Post Inpatient/ED visit) call.   Signature  Charmaine Bloodgood, LPN [facility] Health Advisor [place] [facility] You Are. We Are. One [facility] Direct Dial [phone]  "

## 2024-05-13 LAB — CULTURE, BLOOD (ROUTINE X 2)
Culture: NO GROWTH
Culture: NO GROWTH
Special Requests: ADEQUATE
Special Requests: ADEQUATE

## 2024-05-20 NOTE — Transitions of Care (Post Inpatient/ED Visit) (Signed)
" ° °  05/20/2024  Name: LYCAN DAVEE MRN: [id number] DOB: [birth year]  Today's TOC FU Call Status: Today's TOC FU Call Status:: Unsuccessful Call (3rd Attempt) Unsuccessful Call (1st Attempt) Date: 05/12/24 Unsuccessful Call (3rd Attempt) Date: 05/20/24  Attempted to reach the patient regarding the most recent Inpatient/ED visit.  Follow Up Plan: No further outreach attempts will be made at this time. We have been unable to contact the patient.  Signature Julian Lemmings, LPN [facility] Nurse Health Advisor Direct Dial [phone]  "

## 2024-05-26 ENCOUNTER — Ambulatory Visit: Admitting: Pulmonary Disease

## 2024-07-28 ENCOUNTER — Ambulatory Visit: Payer: Medicare Other

## 2024-07-31 ENCOUNTER — Other Ambulatory Visit

## 2024-08-07 ENCOUNTER — Ambulatory Visit: Admitting: Urology

## 2024-10-27 ENCOUNTER — Ambulatory Visit: Payer: Medicare Other

## 2024-11-20 ENCOUNTER — Ambulatory Visit

## 2024-11-21 ENCOUNTER — Ambulatory Visit

## 2025-01-26 ENCOUNTER — Ambulatory Visit: Payer: Medicare Other

## 2025-04-27 ENCOUNTER — Ambulatory Visit: Payer: Medicare Other

## 2025-07-27 ENCOUNTER — Ambulatory Visit: Payer: Medicare Other

## 2025-10-26 ENCOUNTER — Ambulatory Visit: Payer: Medicare Other

## 2026-01-25 ENCOUNTER — Ambulatory Visit: Payer: Medicare Other
